# Patient Record
Sex: Female | Born: 1950
Health system: Southern US, Community
[De-identification: ages and names within clinical notes are randomized; demographics above are authoritative.]

## PROBLEM LIST (undated history)

## (undated) DIAGNOSIS — K219 Gastro-esophageal reflux disease without esophagitis: Secondary | ICD-10-CM

## (undated) DIAGNOSIS — N183 Chronic kidney disease, stage 3 unspecified: Secondary | ICD-10-CM

## (undated) DIAGNOSIS — Z992 Dependence on renal dialysis: Secondary | ICD-10-CM

## (undated) DIAGNOSIS — N186 End stage renal disease: Secondary | ICD-10-CM

## (undated) DIAGNOSIS — K635 Polyp of colon: Secondary | ICD-10-CM

## (undated) DIAGNOSIS — E042 Nontoxic multinodular goiter: Secondary | ICD-10-CM

## (undated) DIAGNOSIS — D649 Anemia, unspecified: Secondary | ICD-10-CM

## (undated) DIAGNOSIS — E871 Hypo-osmolality and hyponatremia: Secondary | ICD-10-CM

## (undated) DIAGNOSIS — F419 Anxiety disorder, unspecified: Secondary | ICD-10-CM

## (undated) DIAGNOSIS — I10 Essential (primary) hypertension: Secondary | ICD-10-CM

## (undated) DIAGNOSIS — Z8619 Personal history of other infectious and parasitic diseases: Secondary | ICD-10-CM

## (undated) DIAGNOSIS — K2970 Gastritis, unspecified, without bleeding: Secondary | ICD-10-CM

## (undated) DIAGNOSIS — D72829 Elevated white blood cell count, unspecified: Secondary | ICD-10-CM

## (undated) HISTORY — DX: Essential (primary) hypertension: I10

## (undated) HISTORY — PX: KNEE SURGERY: SHX244

## (undated) HISTORY — DX: Gastro-esophageal reflux disease without esophagitis: K21.9

## (undated) HISTORY — DX: Personal history of other infectious and parasitic diseases: Z86.19

## (undated) HISTORY — PX: TONSILLECTOMY: SUR1361

---

## 1968-05-03 HISTORY — PX: TONSILLECTOMY AND ADENOIDECTOMY: SHX28

## 1983-05-04 HISTORY — PX: DILATION AND CURETTAGE OF UTERUS: SHX78

## 1988-05-03 HISTORY — PX: BREAST LUMPECTOMY: SHX2

## 1998-05-03 DIAGNOSIS — K219 Gastro-esophageal reflux disease without esophagitis: Secondary | ICD-10-CM | POA: Insufficient documentation

## 2000-10-03 ENCOUNTER — Other Ambulatory Visit: Admission: RE | Admit: 2000-10-03 | Discharge: 2000-10-03 | Payer: Self-pay | Admitting: Family Medicine

## 2001-05-03 HISTORY — PX: ABDOMINAL HYSTERECTOMY: SHX81

## 2002-08-02 ENCOUNTER — Encounter: Payer: Self-pay | Admitting: Obstetrics and Gynecology

## 2002-08-02 ENCOUNTER — Ambulatory Visit (HOSPITAL_COMMUNITY): Admission: RE | Admit: 2002-08-02 | Discharge: 2002-08-02 | Payer: Self-pay | Admitting: Obstetrics and Gynecology

## 2006-03-02 ENCOUNTER — Ambulatory Visit: Payer: Self-pay | Admitting: Gastroenterology

## 2006-03-02 HISTORY — PX: UPPER GI ENDOSCOPY: SHX6162

## 2006-03-02 LAB — HM COLONOSCOPY

## 2006-10-24 DIAGNOSIS — I10 Essential (primary) hypertension: Secondary | ICD-10-CM | POA: Insufficient documentation

## 2007-07-31 ENCOUNTER — Ambulatory Visit: Payer: Self-pay | Admitting: Family Medicine

## 2008-05-03 HISTORY — PX: CHOLECYSTECTOMY: SHX55

## 2008-12-03 ENCOUNTER — Ambulatory Visit: Payer: Self-pay | Admitting: General Surgery

## 2008-12-26 ENCOUNTER — Ambulatory Visit: Payer: Self-pay | Admitting: General Surgery

## 2009-01-16 ENCOUNTER — Ambulatory Visit: Payer: Self-pay | Admitting: General Surgery

## 2009-07-18 ENCOUNTER — Ambulatory Visit: Payer: Self-pay | Admitting: Family Medicine

## 2009-07-23 ENCOUNTER — Ambulatory Visit: Payer: Self-pay | Admitting: Family Medicine

## 2009-07-23 HISTORY — PX: OTHER SURGICAL HISTORY: SHX169

## 2009-08-19 ENCOUNTER — Encounter: Admission: RE | Admit: 2009-08-19 | Discharge: 2009-08-19 | Payer: Self-pay | Admitting: Neurosurgery

## 2009-09-03 ENCOUNTER — Encounter: Admission: RE | Admit: 2009-09-03 | Discharge: 2009-09-03 | Payer: Self-pay | Admitting: Neurosurgery

## 2009-09-25 ENCOUNTER — Encounter: Admission: RE | Admit: 2009-09-25 | Discharge: 2009-09-25 | Payer: Self-pay | Admitting: Internal Medicine

## 2010-01-13 ENCOUNTER — Ambulatory Visit: Payer: Self-pay | Admitting: Family Medicine

## 2010-01-28 ENCOUNTER — Ambulatory Visit: Payer: Self-pay | Admitting: Pain Medicine

## 2010-02-04 ENCOUNTER — Ambulatory Visit: Payer: Self-pay | Admitting: Pain Medicine

## 2010-02-05 ENCOUNTER — Ambulatory Visit: Payer: Self-pay | Admitting: Pain Medicine

## 2010-05-03 HISTORY — PX: GALLBLADDER SURGERY: SHX652

## 2010-08-12 ENCOUNTER — Ambulatory Visit: Payer: Self-pay | Admitting: Specialist

## 2010-08-19 ENCOUNTER — Ambulatory Visit: Payer: Self-pay | Admitting: Specialist

## 2010-11-30 ENCOUNTER — Ambulatory Visit: Payer: Self-pay | Admitting: Family Medicine

## 2012-12-12 ENCOUNTER — Ambulatory Visit: Payer: Self-pay | Admitting: Pain Medicine

## 2013-01-04 ENCOUNTER — Ambulatory Visit: Payer: Self-pay | Admitting: Pain Medicine

## 2013-07-11 LAB — LIPID PANEL
Cholesterol: 188 mg/dL (ref 0–200)
HDL: 78 mg/dL — AB (ref 35–70)
LDL CALC: 85 mg/dL
TRIGLYCERIDES: 125 mg/dL (ref 40–160)

## 2013-07-19 LAB — HM MAMMOGRAPHY: HM MAMMO: NORMAL

## 2013-08-10 DIAGNOSIS — M810 Age-related osteoporosis without current pathological fracture: Secondary | ICD-10-CM | POA: Insufficient documentation

## 2013-08-14 ENCOUNTER — Ambulatory Visit: Payer: Self-pay | Admitting: Family Medicine

## 2014-03-12 DIAGNOSIS — M4310 Spondylolisthesis, site unspecified: Secondary | ICD-10-CM | POA: Insufficient documentation

## 2014-04-30 LAB — BASIC METABOLIC PANEL
BUN: 29 mg/dL — AB (ref 4–21)
CREATININE: 1.3 mg/dL — AB (ref 0.5–1.1)
GLUCOSE: 103 mg/dL
Potassium: 5.4 mmol/L — AB (ref 3.4–5.3)
Sodium: 134 mmol/L — AB (ref 137–147)

## 2014-04-30 LAB — HEPATIC FUNCTION PANEL
ALT: 12 U/L (ref 7–35)
AST: 21 U/L (ref 13–35)

## 2014-04-30 LAB — TSH: TSH: 1.25 u[IU]/mL (ref 0.41–5.90)

## 2014-04-30 LAB — CBC AND DIFFERENTIAL
HEMATOCRIT: 34 % — AB (ref 36–46)
Hemoglobin: 12.3 g/dL (ref 12.0–16.0)
PLATELETS: 283 10*3/uL (ref 150–399)
WBC: 5.1 10^3/mL

## 2014-05-21 DIAGNOSIS — M47816 Spondylosis without myelopathy or radiculopathy, lumbar region: Secondary | ICD-10-CM | POA: Insufficient documentation

## 2014-06-20 DIAGNOSIS — M9953 Intervertebral disc stenosis of neural canal of lumbar region: Secondary | ICD-10-CM | POA: Insufficient documentation

## 2014-06-26 DIAGNOSIS — M419 Scoliosis, unspecified: Secondary | ICD-10-CM | POA: Insufficient documentation

## 2014-09-06 ENCOUNTER — Ambulatory Visit
Admission: RE | Admit: 2014-09-06 | Discharge: 2014-09-06 | Disposition: A | Discharge status: Home / Self Care | Payer: BLUE CROSS/BLUE SHIELD | Source: Ambulatory Visit | Attending: Family Medicine | Admitting: Family Medicine

## 2014-09-06 ENCOUNTER — Other Ambulatory Visit: Payer: Self-pay | Admitting: Family Medicine

## 2014-09-06 DIAGNOSIS — M25472 Effusion, left ankle: Secondary | ICD-10-CM | POA: Insufficient documentation

## 2014-09-06 DIAGNOSIS — M25572 Pain in left ankle and joints of left foot: Secondary | ICD-10-CM | POA: Insufficient documentation

## 2014-09-06 DIAGNOSIS — M25571 Pain in right ankle and joints of right foot: Secondary | ICD-10-CM

## 2014-10-16 ENCOUNTER — Other Ambulatory Visit: Payer: Self-pay | Admitting: Family Medicine

## 2014-11-05 ENCOUNTER — Other Ambulatory Visit: Payer: Self-pay | Admitting: Family Medicine

## 2014-11-05 DIAGNOSIS — G4700 Insomnia, unspecified: Secondary | ICD-10-CM | POA: Insufficient documentation

## 2014-11-05 NOTE — Telephone Encounter (Signed)
Ok to call in rx.  Thanks.  

## 2014-11-12 ENCOUNTER — Other Ambulatory Visit: Payer: Self-pay | Admitting: Family Medicine

## 2014-11-14 ENCOUNTER — Other Ambulatory Visit: Payer: Self-pay | Admitting: Family Medicine

## 2014-11-20 ENCOUNTER — Other Ambulatory Visit: Payer: Self-pay | Admitting: Family Medicine

## 2014-11-20 MED ORDER — BUTALBITAL-APAP-CAFFEINE 50-325-40 MG PO TABS: ORAL_TABLET | ORAL | Status: DC

## 2014-11-20 NOTE — Telephone Encounter (Signed)
Pt contacted office for refill request on the following medications:  Butalbital-APAP-Caffeine 50-325-40mg .  1-2 tablets every 6 hours as needed. [place].  CB#[phone]/MW

## 2014-11-20 NOTE — Telephone Encounter (Signed)
Refill request for But-Ace-Caff 50-325-40 mg Last filled by MD on- 09/19/2014 #150 x3 Last Appt: 09/06/2014 Next Appt: 12/04/2014 Please advise refill?

## 2014-11-21 NOTE — Telephone Encounter (Signed)
Rx phoned into pharmacy.

## 2014-11-28 ENCOUNTER — Other Ambulatory Visit: Payer: Self-pay | Admitting: Family Medicine

## 2014-12-02 ENCOUNTER — Other Ambulatory Visit: Payer: Self-pay | Admitting: Family Medicine

## 2014-12-03 DIAGNOSIS — B001 Herpesviral vesicular dermatitis: Secondary | ICD-10-CM | POA: Insufficient documentation

## 2014-12-03 DIAGNOSIS — E876 Hypokalemia: Secondary | ICD-10-CM | POA: Insufficient documentation

## 2014-12-03 DIAGNOSIS — J302 Other seasonal allergic rhinitis: Secondary | ICD-10-CM | POA: Insufficient documentation

## 2014-12-03 DIAGNOSIS — M5126 Other intervertebral disc displacement, lumbar region: Secondary | ICD-10-CM | POA: Insufficient documentation

## 2014-12-03 DIAGNOSIS — M5412 Radiculopathy, cervical region: Secondary | ICD-10-CM | POA: Insufficient documentation

## 2014-12-03 DIAGNOSIS — J329 Chronic sinusitis, unspecified: Secondary | ICD-10-CM | POA: Insufficient documentation

## 2014-12-04 ENCOUNTER — Ambulatory Visit (INDEPENDENT_AMBULATORY_CARE_PROVIDER_SITE_OTHER): Payer: BLUE CROSS/BLUE SHIELD | Admitting: Family Medicine

## 2014-12-04 ENCOUNTER — Encounter: Payer: Self-pay | Admitting: Family Medicine

## 2014-12-04 VITALS — BP 152/76 | HR 62 | Temp 98.4°F | Resp 16 | Ht 62.5 in | Wt 116.0 lb

## 2014-12-04 DIAGNOSIS — I10 Essential (primary) hypertension: Secondary | ICD-10-CM

## 2014-12-04 DIAGNOSIS — Z0000 Encounter for general adult medical examination without abnormal findings: Secondary | ICD-10-CM | POA: Diagnosis not present

## 2014-12-04 DIAGNOSIS — M8580 Other specified disorders of bone density and structure, unspecified site: Secondary | ICD-10-CM

## 2014-12-04 DIAGNOSIS — Z1239 Encounter for other screening for malignant neoplasm of breast: Secondary | ICD-10-CM

## 2014-12-04 DIAGNOSIS — Z13820 Encounter for screening for osteoporosis: Secondary | ICD-10-CM

## 2014-12-04 DIAGNOSIS — Z23 Encounter for immunization: Secondary | ICD-10-CM

## 2014-12-04 MED ORDER — FLUTICASONE PROPIONATE 50 MCG/ACT NA SUSP: 1.0000 | Freq: Every day | NASAL | Status: DC

## 2014-12-04 MED ORDER — BENZONATATE 100 MG PO CAPS: 100.0000 mg | ORAL_CAPSULE | Freq: Three times a day (TID) | ORAL | Status: AC | PRN

## 2014-12-04 MED ORDER — OMEPRAZOLE 40 MG PO CPDR: 40.0000 mg | DELAYED_RELEASE_CAPSULE | Freq: Every day | ORAL | Status: DC

## 2014-12-04 NOTE — Progress Notes (Signed)
Patient: Amy [name], Female    DOB: [birth year], 64 y.o.   MRN: [id number] Visit Date: 12/04/2014  Today's Provider: Lelon Huh, MD   Chief Complaint  Patient presents with  . Annual Exam  . Hypertension  . Anxiety   Subjective:    Annual physical exam Amy [name] is a 64 y.o. female who presents today for health maintenance and complete physical. She feels poorly. Is having a lot of sinus congestion and pressure and just started back on Augmentin.  She reports exercising never. She reports she is sleeping fairly well.  -----------------------------------------------------------------  Hypertension, follow-up:  BP Readings from Last 3 Encounters:  12/04/14 152/76  09/26/14 140/70    She was last seen for hypertension 1 years ago.  BP at that visit was  136/76. Management changes since that visit include  none. She reports good compliance with treatment. She is not having side effects.  She is not exercising. She is adherent to low salt diet.   Outside blood pressures are  Not being checked. She is experiencing swelling. Patient denies chest pain, chest pressure/discomfort, claudication, dyspnea, exertional chest pressure/discomfort, fatigue, irregular heart beat, near-syncope, orthopnea, palpitations, paroxysmal nocturnal dyspnea, syncope and tachypnea.   Cardiovascular risk factors include advanced age (older than 62 for men, 60 for women) and hypertension.  Use of agents associated with hypertension: none.     Weight trend: stable Wt Readings from Last 3 Encounters:  12/04/14 116 lb (52.617 kg)  09/26/14 113 lb (51.256 kg)    Current diet: in general, a "healthy" diet    ------------------------------------------------------------------------  Anxiety Follow up: Last office visit was 1 year ago and no changes were made. Patient reports good compliance with treatment, good tolerance and good symptom control.   Back pain Follow up: Last office  visit was 1 year ago and no changes were made.  Patient is not currently taking any pain medications  To help with her back pain. Patient reports her back pain has worsened.   Review of Systems  HENT: Positive for congestion, sinus pressure and sore throat.   Cardiovascular: Positive for leg swelling (in ankles).  All other systems reviewed and are negative.   Social History She  reports that she has never smoked. She does not have any smokeless tobacco history on file. She reports that she drinks alcohol. She reports that she does not use illicit drugs.  Patient Active Problem List   Diagnosis Date Noted  . Allergic rhinitis, seasonal 12/03/2014  . Cervical radiculopathy 12/03/2014  . Estrogen deficiency 12/03/2014  . Fever blister 12/03/2014  . Herniated lumbar intervertebral disc 12/03/2014  . Hypokalemia 12/03/2014  . Recurrent sinus infections 12/03/2014  . Insomnia 11/05/2014  . OP (osteoporosis) 08/10/2013  . Idiopathic peripheral neuropathy 03/24/2009  . Edema 12/27/2007  . Headache, tension-type 10/25/2006  . Essential (primary) hypertension 10/24/2006  . Anxiety 05/03/1998  . Esophageal reflux 05/03/1998    Past Surgical History  Procedure Laterality Date  . Cholecystectomy  2010  . Gallbladder surgery  2012  . Abdominal hysterectomy  2003    due to heavy periods and clotting during menses  . Breast lumpectomy Left 1990  . Dilation and curettage of uterus  1985  . Tonsillectomy and adenoidectomy  1970  . Knee surgery      Dr. Tamala Julian  . Upper gi endoscopy  03/02/2006    H. Pylori negative; Dr. Allen Norris  . Mri, lumbar spine  07/23/2009    Multilevel  annular bulge and disc protrusion at L2-L3, L3-L4, L4-L5, and L5-S1    Family History  Family Status  Relation Status Death Age  . Mother Deceased   . Father Deceased 37    pancreatic cancer  . Maternal Grandmother Deceased     CHF  . Paternal Grandfather Deceased     Her family history includes Cancer in her  paternal grandfather; Congestive Heart Failure in her maternal grandmother; Heart disease in her maternal uncle; Pancreatic cancer in her father.    Allergies  Allergen Reactions  . Cephalosporins   . Clarithromycin   . Lunesta  [Eszopiclone]     Heart racing  . Penicillin V     Not allerrgic to augmentin  . Sulfa Antibiotics     Previous Medications   AMOXICILLIN-CLAVULANATE (AUGMENTIN XR) 1000-62.5 MG PER TABLET    TAKE 2 TABLETS BY MOUTH TWICE A DAY   ATENOLOL (TENORMIN) 50 MG TABLET    TAKE 1 TABLET BY MOUTH EVERY DAY   BUPROPION (WELLBUTRIN) 75 MG TABLET    Take 2 tablets by mouth daily.   BUTALBITAL-ACETAMINOPHEN-CAFFEINE (FIORICET, ESGIC) 50-325-40 MG PER TABLET    every 6 hours as needed, not to exceed 8 tablets in a day   FLUTICASONE (FLONASE) 50 MCG/ACT NASAL SPRAY    Place 1-2 sprays into both nostrils daily.   FUROSEMIDE (LASIX) 20 MG TABLET    Take 1 tablet by mouth daily as needed.   LISINOPRIL-HYDROCHLOROTHIAZIDE (PRINZIDE,ZESTORETIC) 20-12.5 MG PER TABLET    Take 1 tablet by mouth daily.   LORAZEPAM (ATIVAN) 1 MG TABLET    Take 1 tablet by mouth 2 (two) times daily as needed.   MUPIROCIN OINTMENT (BACTROBAN) 2 %    Apply 1 application topically 3 (three) times daily.   OMEPRAZOLE (PRILOSEC) 40 MG CAPSULE    Take 40 mg by mouth daily.   ZOLPIDEM (AMBIEN) 10 MG TABLET    TAKE 1 TABLET BY MOUTH EVERY NIGHT AT BEDTIME    Patient Care Team: [name], MD as PCP - General (Family Medicine)     Objective:   Vitals: BP 152/76 mmHg  Pulse 62  Temp(Src) 98.4 F (36.9 C) (Oral)  Resp 16  Ht 5' 2.5" (1.588 m)  Wt 116 lb (52.617 kg)  BMI 20.87 kg/m2  SpO2 98%   Physical Exam   General Appearance:    Alert, cooperative, no distress, appears stated age  Head:    Normocephalic, without obvious abnormality, atraumatic  Eyes:    PERRL, conjunctiva/corneas clear, EOM's intact, fundi    benign, both eyes  Ears:    Normal TM's and external ear canals, both ears    Nose:   Congested with clear drainage. Tender frontal sinuses.   Throat:   Lips, mucosa, and tongue normal; teeth and gums normal  Neck:   Supple, symmetrical, trachea midline, no adenopathy;    thyroid:  no enlargement/tenderness/nodules; no carotid   bruit or JVD  Back:     Symmetric, kyphotic, ROM normal, no CVA tenderness  Lungs:     Clear to auscultation bilaterally, respirations unlabored  Chest Wall:    No tenderness or deformity   Heart:    Regular rate and rhythm, S1 and S2 normal, no murmur, rub   or gallop  Breast Exam:    normal appearance, no masses or tenderness  Abdomen:     Soft, non-tender, bowel sounds active all four quadrants,    no masses, no organomegaly  Pevic:  not indicated  Extremities:   Extremities normal, atraumatic, no cyanosis or edema  Pulses:   2+ and symmetric all extremities  Skin:   Skin color, texture, turgor normal, no rashes or lesions  Lymph nodes:   Cervical, supraclavicular, and axillary nodes normal  Neurologic:   CNII-XII intact, normal strength, sensation and reflexes    throughout    Depression Screen PHQ 2/9 Scores 12/04/2014  PHQ - 2 Score 0  PHQ- 9 Score 0      Assessment & Plan:     Routine Health Maintenance and Physical Exam  Exercise Activities and Dietary recommendations Goals    None      Immunization History  Administered Date(s) Administered  . Td 08/15/2000  . Tdap 02/02/2012  . Zoster 01/20/2011    Health Maintenance  Topic Date Due  . Samul Dada  11/24/1967  . MAMMOGRAM  11/24/1998  . COLONOSCOPY  11/24/1998  . ZOSTAVAX  11/23/2008  . DEXA SCAN  11/23/2013  . PNA vac Low Risk Adult (1 of 2 - PCV13) 11/23/2013  . INFLUENZA VACCINE  12/02/2014      Discussed health benefits of physical activity, and encouraged her to engage in regular exercise appropriate for her age and condition.    --------------------------------------------------------------------  1. Screening for breast cancer  - MM  Digital Screening; Future  2. Need for pneumococcal vaccine  - Pneumococcal conjugate vaccine 13-valent  3. Essential hypertension Fairly well controlled. Continue current medications.   - EKG 12-Lead - Lipid panel - Renal function panel  4. Screening for osteoporosis   5. Well woman exam without gynecological exam   6. Osteopenia  - Vit D  25 hydroxy (rtn osteoporosis monitoring) - DG Bone Density; Future

## 2014-12-05 ENCOUNTER — Telehealth: Payer: Self-pay

## 2014-12-05 ENCOUNTER — Other Ambulatory Visit: Payer: Self-pay | Admitting: Family Medicine

## 2014-12-05 ENCOUNTER — Encounter: Payer: Self-pay | Admitting: Family Medicine

## 2014-12-05 LAB — RENAL FUNCTION PANEL
ALBUMIN: 4.6 g/dL (ref 3.6–4.8)
BUN/Creatinine Ratio: 14 (ref 11–26)
BUN: 20 mg/dL (ref 8–27)
CHLORIDE: 98 mmol/L (ref 97–108)
CO2: 21 mmol/L (ref 18–29)
Calcium: 9.7 mg/dL (ref 8.7–10.3)
Creatinine, Ser: 1.39 mg/dL — ABNORMAL HIGH (ref 0.57–1.00)
GFR calc Af Amer: 46 mL/min/{1.73_m2} — ABNORMAL LOW (ref >59)
GFR, EST NON AFRICAN AMERICAN: 40 mL/min/{1.73_m2} — AB (ref >59)
GLUCOSE: 98 mg/dL (ref 65–99)
PHOSPHORUS: 3.1 mg/dL (ref 2.5–4.5)
POTASSIUM: 4.9 mmol/L (ref 3.5–5.2)
SODIUM: 137 mmol/L (ref 134–144)

## 2014-12-05 LAB — LIPID PANEL
CHOL/HDL RATIO: 2.2 ratio (ref 0.0–4.4)
CHOLESTEROL TOTAL: 203 mg/dL — AB (ref 100–199)
HDL: 93 mg/dL (ref >39)
LDL Calculated: 81 mg/dL (ref 0–99)
TRIGLYCERIDES: 143 mg/dL (ref 0–149)
VLDL Cholesterol Cal: 29 mg/dL (ref 5–40)

## 2014-12-05 LAB — VITAMIN D 25 HYDROXY (VIT D DEFICIENCY, FRACTURES): Vit D, 25-Hydroxy: 35.9 ng/mL (ref 30.0–100.0)

## 2014-12-05 MED ORDER — RANITIDINE HCL 150 MG PO TABS: 150.0000 mg | ORAL_TABLET | Freq: Two times a day (BID) | ORAL | Status: DC

## 2014-12-05 NOTE — Telephone Encounter (Signed)
Advised pt of the above results, pt agreed to take ranitidine 150 mg twice a day, she stated that she will be waiting for the prescription to be send to pharmacy. She stated that she will call to make an appointment to recheck the renal panel in 6 - 8 weeks.  Thanks,

## 2014-12-05 NOTE — Telephone Encounter (Signed)
-----   Message from [name], MD sent at 12/05/2014  7:53 AM EDT ----- Creatinine has increased slightly. Some recent studies have shown that reflux medications like omeprazole can increase risk for kidney disease. Recommend she stop taking omeprazole and change to ranitidine 150mg  twice a day as needed for reflux, #60, rf x 2 and recheck renal panel in 6-8 weeks.  Cholesterol and vitamin d levels are both very good.

## 2014-12-17 ENCOUNTER — Ambulatory Visit: Payer: BLUE CROSS/BLUE SHIELD

## 2014-12-24 ENCOUNTER — Other Ambulatory Visit: Payer: Self-pay | Admitting: Family Medicine

## 2014-12-24 NOTE — Telephone Encounter (Signed)
Please call in fioricet

## 2014-12-24 NOTE — Telephone Encounter (Signed)
Pt contacted office for refill request on the following medications: butalbital-acetaminophen-caffeine (FIORICET, ESGIC) 50-325-40 MG per tablet to [facility]. Thanks TNP

## 2014-12-25 NOTE — Telephone Encounter (Signed)
Rx called in to pharmacy. 

## 2014-12-27 ENCOUNTER — Other Ambulatory Visit: Payer: Self-pay | Admitting: Family Medicine

## 2015-01-01 ENCOUNTER — Other Ambulatory Visit: Payer: Self-pay | Admitting: Family Medicine

## 2015-01-01 NOTE — Telephone Encounter (Signed)
Refill request for lisinopril-hctz 20-12.5 mg qd Last filled by MD on- 01/28/2014 #30 x10 refills Last Appt: 12/04/2014 Next Appt: none Please advise refill?

## 2015-01-16 ENCOUNTER — Other Ambulatory Visit: Payer: Self-pay | Admitting: Family Medicine

## 2015-01-21 ENCOUNTER — Ambulatory Visit (INDEPENDENT_AMBULATORY_CARE_PROVIDER_SITE_OTHER): Payer: BLUE CROSS/BLUE SHIELD | Admitting: Family Medicine

## 2015-01-21 ENCOUNTER — Encounter: Payer: Self-pay | Admitting: Family Medicine

## 2015-01-21 VITALS — BP 148/80 | HR 63 | Temp 98.7°F | Resp 16 | Wt 116.0 lb

## 2015-01-21 DIAGNOSIS — K219 Gastro-esophageal reflux disease without esophagitis: Secondary | ICD-10-CM | POA: Diagnosis not present

## 2015-01-21 DIAGNOSIS — N189 Chronic kidney disease, unspecified: Secondary | ICD-10-CM

## 2015-01-21 DIAGNOSIS — Z23 Encounter for immunization: Secondary | ICD-10-CM | POA: Diagnosis not present

## 2015-01-21 DIAGNOSIS — I10 Essential (primary) hypertension: Secondary | ICD-10-CM

## 2015-01-21 MED ORDER — BUTALBITAL-APAP-CAFFEINE 50-325-40 MG PO TABS: ORAL_TABLET | ORAL | Status: DC

## 2015-01-21 NOTE — Progress Notes (Signed)
Patient: Amy [name] Female    DOB: [birth year]   64 y.o.   MRN: [id number] Visit Date: 01/21/2015  Today's Provider: Lelon Huh, MD   Chief Complaint  Patient presents with  . Follow-up  . Gastrophageal Reflux   Subjective:    HPI  She is her to follow up on elevated creatinine of 1.39 on labs from CPE 12/04/2014. She is noted to have a very slow increase in eGFR over the last several years. She is on ACEI-hctz combo for hypertension, but has had no changes in her medications for long period of time. We decided to take her off of omeprazole due to studies finding increased risk of CKD in patients on long term PPI. She has started ranitidine and states she has had no side effects, and has no heartburn or other reflux symptoms since changing medication.   BMP Latest Ref Rng 12/04/2014 04/30/2014  Glucose 65 - 99 mg/dL 98 -  BUN 8 - 27 mg/dL 20 29(A)  Creatinine 0.57 - 1.00 mg/dL 1.39(H) 1.3(A)  BUN/Creat Ratio 11 - 26 14 -  Sodium 134 - 144 mmol/L 137 134(A)  Potassium 3.5 - 5.2 mmol/L 4.9 5.4(A)  Chloride 97 - 108 mmol/L 98 -  CO2 18 - 29 mmol/L 21 -  Calcium 8.7 - 10.3 mg/dL 9.7 -      Allergies  Allergen Reactions  . Cephalosporins   . Clarithromycin   . Lunesta  [Eszopiclone]     Heart racing  . Penicillin V     Not allerrgic to augmentin  . Sulfa Antibiotics    Previous Medications   AMOXICILLIN-CLAVULANATE (AUGMENTIN XR) 1000-62.5 MG PER TABLET    TAKE 2 TABLETS BY MOUTH TWICE A DAY   ATENOLOL (TENORMIN) 50 MG TABLET    TAKE 1 TABLET BY MOUTH EVERY DAY   BUPROPION (WELLBUTRIN) 75 MG TABLET    Take 2 tablets by mouth daily.   BUTALBITAL-ACETAMINOPHEN-CAFFEINE (FIORICET, ESGIC) 50-325-40 MG PER TABLET    TAKE 1 TABLET BY MOUTH EVERY 6 HOURS AS NEEDED. MAX 8 TABLETS DAILY   FLUTICASONE (FLONASE) 50 MCG/ACT NASAL SPRAY    Place 1-2 sprays into both nostrils daily.   FUROSEMIDE (LASIX) 20 MG TABLET    Take 1 tablet by mouth daily as needed.   LISINOPRIL-HYDROCHLOROTHIAZIDE (PRINZIDE,ZESTORETIC) 20-12.5 MG PER TABLET    TAKE 1 TABLET BY MOUTH EVERY DAY   LORAZEPAM (ATIVAN) 1 MG TABLET    Take 1 tablet by mouth 2 (two) times daily as needed.   MUPIROCIN OINTMENT (BACTROBAN) 2 %    Apply 1 application topically 3 (three) times daily.   OMEPRAZOLE (PRILOSEC) 40 MG CAPSULE    Take 1 capsule (40 mg total) by mouth daily.   RANITIDINE (ZANTAC) 150 MG TABLET    Take 1 tablet (150 mg total) by mouth 2 (two) times daily.   ZOLPIDEM (AMBIEN) 10 MG TABLET    TAKE 1 TABLET BY MOUTH EVERY NIGHT AT BEDTIME    Review of Systems  Constitutional: Negative for fever, chills, appetite change and fatigue.  Respiratory: Negative for chest tightness and shortness of breath.   Cardiovascular: Positive for leg swelling. Negative for chest pain and palpitations.  Gastrointestinal: Negative for nausea, vomiting and abdominal pain.  Neurological: Negative for dizziness, weakness and light-headedness.    Social History  Substance Use Topics  . Smoking status: Never Smoker   . Smokeless tobacco: Not on file  . Alcohol Use: 0.0 oz/week  0 Standard drinks or equivalent per week     Comment: occasional   Objective:   BP 160/90 mmHg  Pulse 63  Temp(Src) 98.7 F (37.1 C) (Oral)  Resp 16  Wt 116 lb (52.617 kg)  SpO2 100%  Physical Exam  General Appearance:    Alert, cooperative, no distress  Eyes:    PERRL, conjunctiva/corneas clear, EOM's intact       Lungs:     Clear to auscultation bilaterally, respirations unlabored  Heart:    Regular rate and rhythm. Trace bilateral ankle edema.   Neurologic:   Awake, alert, oriented x 3. No apparent focal neurological           defect.          Assessment & Plan:     1. Chronic kidney disease, unspecified stage Likely multifactorial as she has chronic hypertension. Possible contribution of PPI, which she is now off of as below. Recheck renal functions to re-establish baseline. Counseled to avoid NSAIDs  and drink plenty of water.  - Renal function panel  2. Gastroesophageal reflux disease, esophagitis presence not specified Off of PPI due to potential renal effects, well controlled on ranitidine.   3. Essential (primary) hypertension BP up today, but usually SBP under 140. Counseled to avoid salty foods.   4. Need for influenza vaccination  - Flu vaccine HIGH DOSE PF       Lelon Huh, MD  [facility]

## 2015-01-24 ENCOUNTER — Other Ambulatory Visit: Payer: Self-pay | Admitting: Family Medicine

## 2015-01-27 ENCOUNTER — Other Ambulatory Visit: Payer: Self-pay | Admitting: Family Medicine

## 2015-01-28 ENCOUNTER — Other Ambulatory Visit: Payer: Self-pay | Admitting: Family Medicine

## 2015-01-28 ENCOUNTER — Other Ambulatory Visit: Payer: Self-pay

## 2015-01-28 MED ORDER — LISINOPRIL-HYDROCHLOROTHIAZIDE 20-12.5 MG PO TABS: 1.0000 | ORAL_TABLET | Freq: Every day | ORAL | Status: DC

## 2015-02-10 ENCOUNTER — Other Ambulatory Visit: Payer: Self-pay | Admitting: Family Medicine

## 2015-02-25 ENCOUNTER — Other Ambulatory Visit: Payer: Self-pay | Admitting: Family Medicine

## 2015-03-17 ENCOUNTER — Other Ambulatory Visit: Payer: Self-pay | Admitting: Family Medicine

## 2015-03-26 ENCOUNTER — Other Ambulatory Visit: Payer: Self-pay | Admitting: Family Medicine

## 2015-03-26 NOTE — Telephone Encounter (Signed)
Rx called in to pharmacy. 

## 2015-03-26 NOTE — Telephone Encounter (Signed)
Please call in lorazepam.  

## 2015-03-30 ENCOUNTER — Other Ambulatory Visit: Payer: Self-pay | Admitting: Family Medicine

## 2015-03-31 NOTE — Telephone Encounter (Signed)
Please call in alprazolam.  

## 2015-04-01 ENCOUNTER — Other Ambulatory Visit: Payer: Self-pay | Admitting: Family Medicine

## 2015-04-01 NOTE — Telephone Encounter (Signed)
Rx called in to pharmacy. 

## 2015-04-18 ENCOUNTER — Other Ambulatory Visit: Payer: Self-pay | Admitting: Family Medicine

## 2015-04-18 NOTE — Telephone Encounter (Signed)
Pt contacted office for refill request on the following medications: butalbital-acetaminophen-caffeine (FIORICET, ESGIC) 50-325-40 MG per tablet to [facility]. Thanks TNP

## 2015-04-20 ENCOUNTER — Other Ambulatory Visit: Payer: Self-pay | Admitting: Family Medicine

## 2015-04-21 MED ORDER — BUTALBITAL-APAP-CAFFEINE 50-325-40 MG PO TABS: ORAL_TABLET | ORAL | Status: DC

## 2015-04-21 NOTE — Telephone Encounter (Signed)
Rx called in to pharmacy. 

## 2015-04-21 NOTE — Telephone Encounter (Signed)
Please Call in [facility]

## 2015-04-22 ENCOUNTER — Other Ambulatory Visit: Payer: Self-pay | Admitting: Family Medicine

## 2015-04-29 ENCOUNTER — Other Ambulatory Visit: Payer: Self-pay | Admitting: Family Medicine

## 2015-04-29 DIAGNOSIS — G4700 Insomnia, unspecified: Secondary | ICD-10-CM

## 2015-05-01 NOTE — Telephone Encounter (Signed)
Please call in zolpidem  

## 2015-05-01 NOTE — Telephone Encounter (Signed)
Rx called in to pharmacy. 

## 2015-05-06 ENCOUNTER — Other Ambulatory Visit: Payer: Self-pay | Admitting: Family Medicine

## 2015-05-25 ENCOUNTER — Other Ambulatory Visit: Payer: Self-pay | Admitting: Family Medicine

## 2015-06-16 ENCOUNTER — Other Ambulatory Visit: Payer: Self-pay | Admitting: Family Medicine

## 2015-06-24 ENCOUNTER — Telehealth: Payer: Self-pay | Admitting: Family Medicine

## 2015-06-24 MED ORDER — VALACYCLOVIR HCL 1 G PO TABS: ORAL_TABLET | ORAL | Status: DC

## 2015-06-24 NOTE — Telephone Encounter (Signed)
Pt is requesting a Rx for fever blisters that she has rec'[d before.  Pt states has rec'd a ointment and a pill to treat this in the past. Pt is not sure of the name of either Rx.  [place].  CB#[phone]/MW

## 2015-06-24 NOTE — Telephone Encounter (Signed)
Please review. Thanks!  

## 2015-06-27 ENCOUNTER — Encounter: Payer: Self-pay | Admitting: Family Medicine

## 2015-06-27 ENCOUNTER — Telehealth: Payer: Self-pay | Admitting: Family Medicine

## 2015-06-27 DIAGNOSIS — E041 Nontoxic single thyroid nodule: Secondary | ICD-10-CM | POA: Insufficient documentation

## 2015-06-27 NOTE — Telephone Encounter (Signed)
Please advise patient we received copy of recent MRI from [facility] showing small thyroid nodule. She needs to have thyroid ultrasound done for further evaluation, and schedule follow up o.v. 1-2 days later to go over results.

## 2015-06-29 ENCOUNTER — Other Ambulatory Visit: Payer: Self-pay | Admitting: Family Medicine

## 2015-06-30 NOTE — Telephone Encounter (Signed)
Please schedule US thyroid. Thanks!

## 2015-06-30 NOTE — Telephone Encounter (Signed)
Patient notified. Patient expressed understanding.  

## 2015-07-01 ENCOUNTER — Ambulatory Visit: Payer: BLUE CROSS/BLUE SHIELD | Admitting: Family Medicine

## 2015-07-02 ENCOUNTER — Ambulatory Visit: Payer: BLUE CROSS/BLUE SHIELD

## 2015-07-04 ENCOUNTER — Telehealth: Payer: Self-pay | Admitting: Family Medicine

## 2015-07-04 DIAGNOSIS — E042 Nontoxic multinodular goiter: Secondary | ICD-10-CM | POA: Insufficient documentation

## 2015-07-04 NOTE — Telephone Encounter (Signed)
Ultrasound shows several small thyroid cysts. These are usually benign, but need further evaluation to make sure. Need referral to ENT

## 2015-07-04 NOTE — Telephone Encounter (Signed)
Advised patient of results. Please refer patient to ENT for further evaluation.

## 2015-07-04 NOTE — Telephone Encounter (Signed)
Pt is calling back about test results from her thyroid US.  ThanksTeri

## 2015-07-04 NOTE — Telephone Encounter (Signed)
Have you received results?-aa

## 2015-07-14 ENCOUNTER — Other Ambulatory Visit: Payer: Self-pay | Admitting: Family Medicine

## 2015-07-18 ENCOUNTER — Encounter: Payer: Self-pay | Admitting: Family Medicine

## 2015-07-21 ENCOUNTER — Other Ambulatory Visit: Payer: Self-pay | Admitting: Family Medicine

## 2015-07-22 ENCOUNTER — Encounter: Payer: Self-pay | Admitting: Family Medicine

## 2015-07-22 ENCOUNTER — Telehealth: Payer: Self-pay | Admitting: Family Medicine

## 2015-07-22 ENCOUNTER — Ambulatory Visit (INDEPENDENT_AMBULATORY_CARE_PROVIDER_SITE_OTHER): Payer: BLUE CROSS/BLUE SHIELD | Admitting: Family Medicine

## 2015-07-22 VITALS — BP 140/80 | HR 66 | Temp 99.1°F | Resp 16 | Ht 62.5 in | Wt 109.0 lb

## 2015-07-22 DIAGNOSIS — I10 Essential (primary) hypertension: Secondary | ICD-10-CM

## 2015-07-22 DIAGNOSIS — E041 Nontoxic single thyroid nodule: Secondary | ICD-10-CM | POA: Diagnosis not present

## 2015-07-22 DIAGNOSIS — K219 Gastro-esophageal reflux disease without esophagitis: Secondary | ICD-10-CM

## 2015-07-22 DIAGNOSIS — J0111 Acute recurrent frontal sinusitis: Secondary | ICD-10-CM | POA: Diagnosis not present

## 2015-07-22 MED ORDER — FIRST-DUKES MOUTHWASH MT SUSP
10.0000 mL | OROMUCOSAL | Status: DC | PRN
Start: 2015-07-22 — End: 2015-12-11

## 2015-07-22 MED ORDER — AMOXICILLIN-POT CLAVULANATE 875-125 MG PO TABS: 1.0000 | ORAL_TABLET | Freq: Two times a day (BID) | ORAL | Status: DC

## 2015-07-22 NOTE — Progress Notes (Signed)
Patient: Amy [name] Female    DOB: 1950-10-02   65 y.o.   MRN: [id number] Visit Date: 07/22/2015  Today's Provider: Lelon Huh, MD   Chief Complaint  Patient presents with  . Sore Throat   Subjective:    Sore Throat  This is a new problem. Episode onset: 3 days ago. The problem has been unchanged. Neither side of throat is experiencing more pain than the other. There has been no fever. The pain is moderate. Associated symptoms include congestion, coughing, headaches, a hoarse voice, neck pain and trouble swallowing. Pertinent negatives include no abdominal pain, diarrhea, drooling, ear discharge, ear pain, plugged ear sensation, shortness of breath, stridor, swollen glands or vomiting. She has had exposure to strep. She has had no exposure to mono. Treatments tried: advil. The treatment provided mild relief.   Sore throat since Saturday 07/19/2015. Nasal congestion, sore throat, bilateral ear congestion, cough (productive) and some wheezing.   She is also to follow up on low eGFR from labs 01/2015. Has since changed from omeprazole to ranitidine due to increased risk of CKD with PPIs. She states doesn't remember to take ranitidine every day, but it works fairly well when she does take it.   She also had incidental finding of thyroid nodule on imaging studies of her c-spine, and has since had evaluation by Dr. Tami Ribas who recommend u/s after 6 months. She would like to have thyroid functions checked because she has been a little more fatigued.    Allergies  Allergen Reactions  . Cephalosporins   . Clarithromycin   . Lunesta  [Eszopiclone]     Heart racing  . Penicillin V     Not allerrgic to augmentin  . Sulfa Antibiotics    Previous Medications   ATENOLOL (TENORMIN) 50 MG TABLET    TAKE 1 TABLET BY MOUTH EVERY DAY   BUPROPION (WELLBUTRIN) 75 MG TABLET    Take 2 tablets by mouth daily.   BUTALBITAL-ACETAMINOPHEN-CAFFEINE (FIORICET, ESGIC) 50-325-40 MG TABLET    TAKE 1  TABLET BY MOUTH EVERY 6 HOURS AS NEEDED. MAX 8 TABLETS DAILY   BUTALBITAL-ACETAMINOPHEN-CAFFEINE (FIORICET, ESGIC) 50-325-40 MG TABLET    TAKE 1 TABLET BY MOUTH EVERY 6 HOURS AS NEEDED(MAX 8 TABLETS DAILY)   FLUTICASONE (FLONASE) 50 MCG/ACT NASAL SPRAY    Place 1-2 sprays into both nostrils daily.   FUROSEMIDE (LASIX) 20 MG TABLET    TAKE 1 TABLET BY MOUTH EVERY DAY AS NEEDED FOR SWELLING   LISINOPRIL-HYDROCHLOROTHIAZIDE (PRINZIDE,ZESTORETIC) 20-12.5 MG TABLET    Take 1 tablet by mouth daily.   LORAZEPAM (ATIVAN) 1 MG TABLET    TAKE 1 TABLET BY MOUTH TWO TIMES DAILY AS NEEDED   MUPIROCIN OINTMENT (BACTROBAN) 2 %    Apply 1 application topically 3 (three) times daily.   RANITIDINE (ZANTAC) 150 MG TABLET    Take 1 tablet (150 mg total) by mouth 2 (two) times daily.   VALACYCLOVIR (VALTREX) 1000 MG TABLET    2 tablets twice a day for 1 day as needed for cold sores   ZOLPIDEM (AMBIEN) 10 MG TABLET    Take 1 tablet (10 mg total) by mouth at bedtime as needed for sleep.    Review of Systems  Constitutional: Negative for fever, chills, appetite change and fatigue.  HENT: Positive for congestion, hoarse voice, sinus pressure, sore throat and trouble swallowing. Negative for drooling, ear discharge and ear pain.        Ear congestion bilateral  Respiratory: Positive for cough and wheezing. Negative for chest tightness, shortness of breath and stridor.   Cardiovascular: Negative for chest pain and palpitations.  Gastrointestinal: Negative for nausea, vomiting, abdominal pain and diarrhea.  Musculoskeletal: Positive for neck pain.  Neurological: Positive for headaches. Negative for dizziness and weakness.    Social History  Substance Use Topics  . Smoking status: Never Smoker   . Smokeless tobacco: Not on file  . Alcohol Use: 0.0 oz/week    0 Standard drinks or equivalent per week     Comment: occasional   Objective:   BP 140/80 mmHg  Pulse 66  Temp(Src) 99.1 F (37.3 C) (Oral)  Resp 16   Ht 5' 2.5" (1.588 m)  Wt 109 lb (49.442 kg)  BMI 19.61 kg/m2  SpO2 98%  Physical Exam  General Appearance:    Alert, cooperative, no distress  HENT:   bilateral TM normal without fluid or infection, neck without nodes, frontal and maxillary sinuses tender and nasal mucosa pale and congested  Eyes:    PERRL, conjunctiva/corneas clear, EOM's intact       Lungs:     Clear to auscultation bilaterally, respirations unlabored  Heart:    Regular rate and rhythm  Neurologic:   Awake, alert, oriented x 3. No apparent focal neurological           defect.           Assessment & Plan:     1. Acute recurrent frontal sinusitis  - amoxicillin-clavulanate (AUGMENTIN) 875-125 MG tablet; Take 1 tablet by mouth 2 (two) times daily.  Dispense: 20 tablet; Refill: 0  2. Essential (primary) hypertension Well controlled.  Continue current medications.   - Comprehensive metabolic panel  3. Right thyroid nodule  - T4 AND TSH  4. Gastroesophageal reflux disease, esophagitis presence not specified Switched from PPI to H2 blocker due to decline in renal functions. Stable        Lelon Huh, MD  [facility]

## 2015-07-22 NOTE — Telephone Encounter (Signed)
Pt called saying the [facility] does not have the Augmentin.    Can you give her a Zpac instead.   Her call back is [phone].  Thanks Con Memos

## 2015-07-22 NOTE — Telephone Encounter (Signed)
Patient stated that it will take 2 days before they have Augmentin 875 in stock. Patient was requesting another medication.

## 2015-07-23 ENCOUNTER — Encounter: Payer: Self-pay | Admitting: Family Medicine

## 2015-07-23 ENCOUNTER — Telehealth: Payer: Self-pay | Admitting: *Deleted

## 2015-07-23 LAB — T4 AND TSH
T4 TOTAL: 4.9 ug/dL (ref 4.5–12.0)
TSH: 0.999 u[IU]/mL (ref 0.450–4.500)

## 2015-07-23 LAB — COMPREHENSIVE METABOLIC PANEL
A/G RATIO: 2.6 — AB (ref 1.2–2.2)
ALK PHOS: 126 IU/L — AB (ref 39–117)
ALT: 17 IU/L (ref 0–32)
AST: 22 IU/L (ref 0–40)
Albumin: 4.6 g/dL (ref 3.6–4.8)
BUN/Creatinine Ratio: 17 (ref 11–26)
BUN: 23 mg/dL (ref 8–27)
CHLORIDE: 94 mmol/L — AB (ref 96–106)
CO2: 22 mmol/L (ref 18–29)
Calcium: 9.7 mg/dL (ref 8.7–10.3)
Creatinine, Ser: 1.32 mg/dL — ABNORMAL HIGH (ref 0.57–1.00)
GFR calc Af Amer: 48 mL/min/{1.73_m2} — ABNORMAL LOW (ref >59)
GFR calc non Af Amer: 42 mL/min/{1.73_m2} — ABNORMAL LOW (ref >59)
GLOBULIN, TOTAL: 1.8 g/dL (ref 1.5–4.5)
Glucose: 107 mg/dL — ABNORMAL HIGH (ref 65–99)
POTASSIUM: 5.3 mmol/L — AB (ref 3.5–5.2)
SODIUM: 134 mmol/L (ref 134–144)
Total Protein: 6.4 g/dL (ref 6.0–8.5)

## 2015-07-23 NOTE — Telephone Encounter (Signed)
Changed to zpack

## 2015-07-23 NOTE — Telephone Encounter (Signed)
Patient is requesting a cough syrup to help her rest at night. Also pt wanted to know if Dr. Caryn Section can send in amoxicillin 1000 mg to [facility] at [address]? Please advise?

## 2015-07-25 ENCOUNTER — Other Ambulatory Visit: Payer: Self-pay | Admitting: Family Medicine

## 2015-07-25 MED ORDER — GUAIFENESIN-CODEINE 100-10 MG/5ML PO SOLN: 5.0000 mL | Freq: Every evening | ORAL | Status: AC | PRN

## 2015-07-25 NOTE — Telephone Encounter (Signed)
RX called in and Amox was already sent in. Pt advised-aa

## 2015-07-25 NOTE — Telephone Encounter (Signed)
Can call in robitussin [facility]

## 2015-07-25 NOTE — Telephone Encounter (Signed)
Rx called in to pharmacy. 

## 2015-08-08 ENCOUNTER — Encounter: Payer: Self-pay | Admitting: Family Medicine

## 2015-08-10 ENCOUNTER — Other Ambulatory Visit: Payer: Self-pay | Admitting: Family Medicine

## 2015-08-15 ENCOUNTER — Encounter: Payer: Self-pay | Admitting: Family Medicine

## 2015-08-18 ENCOUNTER — Other Ambulatory Visit: Payer: Self-pay | Admitting: Family Medicine

## 2015-08-25 ENCOUNTER — Other Ambulatory Visit: Payer: Self-pay | Admitting: Family Medicine

## 2015-09-05 ENCOUNTER — Other Ambulatory Visit: Payer: Self-pay | Admitting: Family Medicine

## 2015-09-05 ENCOUNTER — Telehealth: Payer: Self-pay | Admitting: Family Medicine

## 2015-09-05 MED ORDER — BUTALBITAL-APAP-CAFFEINE 50-325-40 MG PO TABS: ORAL_TABLET | ORAL | Status: DC

## 2015-09-15 ENCOUNTER — Other Ambulatory Visit: Payer: Self-pay | Admitting: Family Medicine

## 2015-09-16 ENCOUNTER — Other Ambulatory Visit: Payer: Self-pay | Admitting: Family Medicine

## 2015-09-16 NOTE — Telephone Encounter (Signed)
Rx called in to pharmacy. 

## 2015-09-16 NOTE — Telephone Encounter (Signed)
Please call in butalbital.  

## 2015-09-17 ENCOUNTER — Other Ambulatory Visit: Payer: Self-pay | Admitting: Family Medicine

## 2015-09-26 ENCOUNTER — Telehealth: Payer: Self-pay | Admitting: Family Medicine

## 2015-09-26 NOTE — Telephone Encounter (Signed)
[place] that you call some drops and ointment into [facility] (you have prescribed in the past) for pink eye.Her right eye is red,burning.[facility] Phone # [phone].Pt can be reached on mobile that is listed

## 2015-09-26 NOTE — Telephone Encounter (Signed)
Please review, does she need to see someone there for this?-aa

## 2015-09-27 MED ORDER — CIPROFLOXACIN HCL 0.3 % OP SOLN
1.0000 [drp] | OPHTHALMIC | Status: AC
Start: 2015-09-27 — End: 2015-10-04

## 2015-10-01 ENCOUNTER — Other Ambulatory Visit: Payer: Self-pay | Admitting: Family Medicine

## 2015-10-01 NOTE — Telephone Encounter (Signed)
Please call in lorazepam.  

## 2015-10-02 NOTE — Telephone Encounter (Signed)
Rx called in to pharmacy. 

## 2015-10-02 NOTE — Telephone Encounter (Signed)
Already called into pharmacy this morning.

## 2015-10-02 NOTE — Telephone Encounter (Signed)
Pt called saying the pharmacy has not heard back on refill request for   LORazepam (ATIVAN) 1 MG tablet   Please advise.  Thanks Amy [name]

## 2015-10-02 NOTE — Telephone Encounter (Signed)
Please call in lorazepam.  

## 2015-10-13 ENCOUNTER — Ambulatory Visit (INDEPENDENT_AMBULATORY_CARE_PROVIDER_SITE_OTHER): Payer: BLUE CROSS/BLUE SHIELD | Admitting: Family Medicine

## 2015-10-13 ENCOUNTER — Encounter: Payer: Self-pay | Admitting: Family Medicine

## 2015-10-13 VITALS — BP 150/78 | HR 64 | Temp 98.6°F | Resp 16 | Wt 115.0 lb

## 2015-10-13 DIAGNOSIS — M545 Low back pain: Secondary | ICD-10-CM

## 2015-10-13 LAB — POCT URINALYSIS DIPSTICK
Bilirubin, UA: NEGATIVE
Blood, UA: NEGATIVE
Glucose, UA: NEGATIVE
Ketones, UA: NEGATIVE
LEUKOCYTES UA: NEGATIVE
NITRITE UA: NEGATIVE
PH UA: 6
Spec Grav, UA: 1.02
Urobilinogen, UA: 0.2

## 2015-10-13 MED ORDER — CYCLOBENZAPRINE HCL 5 MG PO TABS: 5.0000 mg | ORAL_TABLET | Freq: Every day | ORAL | Status: AC

## 2015-10-13 MED ORDER — PREDNISONE 10 MG PO TABS: ORAL_TABLET | ORAL | Status: AC

## 2015-10-13 NOTE — Progress Notes (Signed)
Patient: Amy [name] Female    DOB: Oct 16, 1950   65 y.o.   MRN: [id number] Visit Date: 10/13/2015  Today's Provider: Lelon Huh, MD   Chief Complaint  Patient presents with  . Back Pain   Subjective:    Back Pain This is a new problem. Episode onset: 2 days ago. The problem occurs constantly. The problem has been gradually worsening since onset. The pain is present in the lumbar spine. The quality of the pain is described as burning. Radiates to: buttocks. The pain is severe. Exacerbated by: certain movements. Pertinent negatives include no abdominal pain, bladder incontinence, bowel incontinence, chest pain, dysuria, fever, headaches, leg pain, numbness, paresis, paresthesias, pelvic pain, perianal numbness, tingling, weakness or weight loss. Risk factors: history of back problems. She has tried NSAIDs for the symptoms. The treatment provided no relief.  Patient states the pain is sometimes severe that if she moves a certain way it takes her breath away.   Taken some Advil gelcaps which doesn't proved much relief. .      Allergies  Allergen Reactions  . Cephalosporins   . Clarithromycin   . Lunesta  [Eszopiclone]     Heart racing  . Penicillin V     Not allerrgic to augmentin  . Sulfa Antibiotics    Current Meds  Medication Sig  . atenolol (TENORMIN) 50 MG tablet TAKE 1 TABLET BY MOUTH EVERY DAY  . buPROPion (WELLBUTRIN) 75 MG tablet TAKE 2 TABLETS BY MOUTH DAILY  . butalbital-acetaminophen-caffeine (FIORICET, ESGIC) 50-325-40 MG tablet TAKE 1 TABLET BY MOUTH EVERY 6 HOURS AS NEEDED. MAX OF 8 TABLETS DAILY.  . Diphenhyd-Hydrocort-Nystatin (FIRST-DUKES MOUTHWASH) SUSP Use as directed 10 mLs in the mouth or throat every 4 (four) hours as needed.  . fluticasone (FLONASE) 50 MCG/ACT nasal spray Place 1-2 sprays into both nostrils daily.  . furosemide (LASIX) 20 MG tablet TAKE 1 TABLET BY MOUTH EVERY DAY AS NEEDED FOR SWELLING  . lisinopril-hydrochlorothiazide  (PRINZIDE,ZESTORETIC) 20-12.5 MG tablet Take 1 tablet by mouth daily.  [name] LORazepam (ATIVAN) 1 MG tablet TAKE 1 TABLET BY MOUTH TWICE A DAY AS NEEDED  . ranitidine (ZANTAC) 150 MG tablet Take 1 tablet (150 mg total) by mouth 2 (two) times daily.  [name] zolpidem (AMBIEN) 10 MG tablet TAKE 1 TABLET BY MOUTH EVERY NIGHT AT BEDTIME AS NEEDED FOR SLEEP    Review of Systems  Constitutional: Positive for fatigue. Negative for fever, chills, weight loss and diaphoresis.  Cardiovascular: Negative for chest pain.  Gastrointestinal: Negative for abdominal pain and bowel incontinence.  Genitourinary: Negative for bladder incontinence, dysuria and pelvic pain.  Musculoskeletal: Positive for myalgias (lower back muscles) and back pain.  Neurological: Negative for tingling, weakness, numbness, headaches and paresthesias.   Patient Active Problem List   Diagnosis Date Noted  . Multiple thyroid nodules 07/04/2015  . Right thyroid nodule 06/27/2015  . Allergic rhinitis, seasonal 12/03/2014  . Estrogen deficiency 12/03/2014  . Fever blister 12/03/2014  . Herniated lumbar intervertebral disc 12/03/2014  . Hypokalemia 12/03/2014  . Recurrent sinus infections 12/03/2014  . Insomnia 11/05/2014  . Acquired scoliosis 06/26/2014  . Intervertebral disc stenosis of neural canal 06/20/2014  . Degenerative spondylolisthesis 03/12/2014  . OP (osteoporosis) 08/10/2013  . Idiopathic peripheral neuropathy ([place]) 03/24/2009  . Edema 12/27/2007  . Headache, tension-type 10/25/2006  . Essential (primary) hypertension 10/24/2006  . Anxiety 05/03/1998  . Esophageal reflux 05/03/1998     Social History  Substance Use Topics  .  Smoking status: Never Smoker   . Smokeless tobacco: Not on file  . Alcohol Use: 0.0 oz/week    0 Standard drinks or equivalent per week     Comment: occasional   Objective:   BP 150/78 mmHg  Pulse 64  Temp(Src) 98.6 F (37 C) (Oral)  Resp 16  Wt 115 lb (52.164 kg)  SpO2 100%  Physical  Exam   General Appearance:    Alert, cooperative, no distress  Eyes:    PERRL, conjunctiva/corneas clear, EOM's intact       Lungs:     Clear to auscultation bilaterally, respirations unlabored  Heart:    Regular rate and rhythm  Neurologic:   Awake, alert, oriented x 3. No apparent focal neurological           defect.   MS:   Tender over lumbar spine and paralumbar muscles. Normal Muscle strength and tone.    Results for orders placed or performed in visit on 10/13/15  POCT urinalysis dipstick  Result Value Ref Range   Color, UA yellow    Clarity, UA clear    Glucose, UA negative    Bilirubin, UA negative    Ketones, UA negative    Spec Grav, UA 1.020    Blood, UA negative    pH, UA 6.0    Protein, UA + 30    Urobilinogen, UA 0.2    Nitrite, UA Negative    Leukocytes, UA Negative Negative       Assessment & Plan:     1. Low back pain, unspecified back pain laterality, with sciatica presence unspecified Call if symptoms change or if not rapidly improving.     - POCT urinalysis dipstick - predniSONE (DELTASONE) 10 MG tablet; 4 tablets a day for 3 days, then 3 daily for 3 days, then 2 daily for 3 days, then 1 daily for 3 days  Dispense: 30 tablet; Refill: 0 - cyclobenzaprine (FLEXERIL) 5 MG tablet; Take 1-2 tablets (5-10 mg total) by mouth at bedtime.  Dispense: 30 tablet; Refill: 0      The entirety of the information documented in the History of Present Illness, Review of Systems and Physical Exam were personally obtained by me. Portions of this information were initially documented by Meyer Cory, CMA and reviewed by me for thoroughness and accuracy.    Lelon Huh, MD  [facility]

## 2015-10-14 ENCOUNTER — Other Ambulatory Visit: Payer: Self-pay | Admitting: Family Medicine

## 2015-10-16 ENCOUNTER — Telehealth: Payer: Self-pay | Admitting: Family Medicine

## 2015-10-16 MED ORDER — AMOXICILLIN-POT CLAVULANATE 875-125 MG PO TABS: 1.0000 | ORAL_TABLET | Freq: Two times a day (BID) | ORAL | Status: AC

## 2015-10-16 NOTE — Telephone Encounter (Signed)
Pt was just in Monday for her back.  She has since came down with a sinus infection.  She would like for you to prescribe an antibiotic for her.   She is allergic to sulfur drugs.  She uses [facility] [address]  Her call back is [phone]  Thanks Con [place]

## 2015-10-16 NOTE — Telephone Encounter (Signed)
Please advise 

## 2015-10-28 ENCOUNTER — Telehealth: Payer: Self-pay

## 2015-10-28 ENCOUNTER — Ambulatory Visit
Admission: RE | Admit: 2015-10-28 | Discharge: 2015-10-28 | Disposition: A | Discharge status: Home / Self Care | Payer: BLUE CROSS/BLUE SHIELD | Source: Ambulatory Visit | Attending: Physician Assistant | Admitting: Physician Assistant

## 2015-10-28 ENCOUNTER — Encounter: Payer: Self-pay | Admitting: Physician Assistant

## 2015-10-28 ENCOUNTER — Ambulatory Visit (INDEPENDENT_AMBULATORY_CARE_PROVIDER_SITE_OTHER): Payer: BLUE CROSS/BLUE SHIELD | Admitting: Physician Assistant

## 2015-10-28 VITALS — BP 140/70 | HR 68 | Temp 98.1°F | Resp 16 | Wt 119.6 lb

## 2015-10-28 DIAGNOSIS — M79671 Pain in right foot: Secondary | ICD-10-CM

## 2015-10-28 DIAGNOSIS — J0101 Acute recurrent maxillary sinusitis: Secondary | ICD-10-CM

## 2015-10-28 MED ORDER — MELOXICAM 7.5 MG PO TABS: 7.5000 mg | ORAL_TABLET | Freq: Two times a day (BID) | ORAL | Status: DC

## 2015-10-28 MED ORDER — AMOXICILLIN-POT CLAVULANATE 875-125 MG PO TABS: 1.0000 | ORAL_TABLET | Freq: Two times a day (BID) | ORAL | Status: DC

## 2015-10-28 NOTE — Patient Instructions (Signed)

## 2015-10-28 NOTE — Progress Notes (Signed)
Patient: Amy [name] Female    DOB: 07-30-1950   65 y.o.   MRN: [id number] Visit Date: 10/28/2015  Today's Provider: Mar Daring, PA-C   Chief Complaint  Patient presents with  . Sore Throat  . Foot Pain   Subjective:    Sore Throat  This is a new (She reports that she has chronic Sinusitis) problem. The problem has been gradually worsening. There has been no fever. Associated symptoms include congestion, coughing (some with walking up the stairs) and headaches. Pertinent negatives include no abdominal pain or trouble swallowing. Treatments tried: Advil sinus cold. The treatment provided no relief.  Foot Pain This is a new (Right foot) problem. The current episode started 1 to 4 weeks ago (started 2 weeks ago; but states that just over a year ago she sprained same ankle pretty severely). The problem occurs constantly. The problem has been gradually worsening. Associated symptoms include arthralgias (right foot), congestion, coughing (some with walking up the stairs), headaches and a sore throat (from the postnasal drip). Pertinent negatives include no abdominal pain, chest pain, fatigue, fever, myalgias, nausea, numbness, rash or weakness. Associated symptoms comments: Swelling of lower extremities bilaterally. The symptoms are aggravated by walking. She has tried NSAIDs and lying down (Elevation at night and advil) for the symptoms. The treatment provided no relief (helps edema but not foot pain).  Can't wear normal shoes; has to wear flip flops. Hurts to bear weight barefoot.    Allergies  Allergen Reactions  . Cephalosporins   . Clarithromycin   . Lunesta  [Eszopiclone]     Heart racing  . Penicillin V     Not allerrgic to augmentin  . Sulfa Antibiotics    Current Meds  Medication Sig  . atenolol (TENORMIN) 50 MG tablet TAKE 1 TABLET BY MOUTH EVERY DAY  . buPROPion (WELLBUTRIN) 75 MG tablet TAKE 2 TABLETS BY MOUTH DAILY  . butalbital-acetaminophen-caffeine  (FIORICET, ESGIC) 50-325-40 MG tablet TAKE 1 TABLET BY MOUTH EVERY 6 HOURS AS NEEDED. MAX OF 8 TABLETS DAILY.  . cyclobenzaprine (FLEXERIL) 5 MG tablet Take 1-2 tablets (5-10 mg total) by mouth at bedtime.  . Diphenhyd-Hydrocort-Nystatin (FIRST-DUKES MOUTHWASH) SUSP Use as directed 10 mLs in the mouth or throat every 4 (four) hours as needed.  . fluticasone (FLONASE) 50 MCG/ACT nasal spray Place 1-2 sprays into both nostrils daily.  . furosemide (LASIX) 20 MG tablet TAKE 1 TABLET BY MOUTH EVERY DAY AS NEEDED FOR SWELLING  . lisinopril-hydrochlorothiazide (PRINZIDE,ZESTORETIC) 20-12.5 MG tablet Take 1 tablet by mouth daily.  [name] LORazepam (ATIVAN) 1 MG tablet TAKE 1 TABLET BY MOUTH TWICE A DAY AS NEEDED  . ranitidine (ZANTAC) 150 MG tablet Take 1 tablet (150 mg total) by mouth 2 (two) times daily.  [name] zolpidem (AMBIEN) 10 MG tablet TAKE 1 TABLET BY MOUTH EVERY NIGHT AT BEDTIME AS NEEDED FOR SLEEP    Review of Systems  Constitutional: Negative for fever and fatigue.  HENT: Positive for congestion, postnasal drip, rhinorrhea, sinus pressure and sore throat (from the postnasal drip). Negative for trouble swallowing.   Respiratory: Positive for cough (some with walking up the stairs). Negative for chest tightness and wheezing.   Cardiovascular: Positive for leg swelling (she came two weeks ago for her "back and also abput her ankle swelling"). Negative for chest pain and palpitations.  Gastrointestinal: Negative for nausea and abdominal pain.  Musculoskeletal: Positive for arthralgias (right foot). Negative for myalgias and gait problem.  Skin: Negative for  rash.  Neurological: Positive for headaches. Negative for weakness and numbness.    Social History  Substance Use Topics  . Smoking status: Never Smoker   . Smokeless tobacco: Not on file  . Alcohol Use: 0.0 oz/week    0 Standard drinks or equivalent per week     Comment: occasional   Objective:   BP 140/70 mmHg  Pulse 68  Temp(Src) 98.1  F (36.7 C) (Oral)  Resp 16  Wt 119 lb 9.6 oz (54.25 kg)  Physical Exam  Constitutional: She appears well-developed and well-nourished. No distress.  HENT:  Head: Normocephalic and atraumatic.  Right Ear: Hearing, tympanic membrane, external ear and ear canal normal.  Left Ear: Hearing, tympanic membrane, external ear and ear canal normal.  Nose: Mucosal edema present. Right sinus exhibits maxillary sinus tenderness. Right sinus exhibits no frontal sinus tenderness. Left sinus exhibits maxillary sinus tenderness. Left sinus exhibits no frontal sinus tenderness.  Mouth/Throat: Uvula is midline, oropharynx is clear and moist and mucous membranes are normal. No oropharyngeal exudate, posterior oropharyngeal edema or posterior oropharyngeal erythema.  Neck: Normal range of motion. Neck supple. No tracheal deviation present. No thyromegaly present.  Cardiovascular: Normal rate, regular rhythm and normal heart sounds.  Exam reveals no gallop and no friction rub.   No murmur heard. Pulmonary/Chest: Effort normal and breath sounds normal. No stridor. No respiratory distress. She has no wheezes. She has no rales.  Musculoskeletal:       Right ankle: She exhibits swelling. She exhibits normal range of motion. No tenderness.       Left ankle: She exhibits swelling. She exhibits normal range of motion. No tenderness.       Right foot: There is tenderness (tender over 2nd-3rd metatarsal heads), bony tenderness (tender over base of 5th metatarsal) and swelling. There is normal range of motion and normal capillary refill.       Left foot: There is swelling. There is normal range of motion, no tenderness, no bony tenderness and normal capillary refill.  Lymphadenopathy:    She has no cervical adenopathy.  Skin: She is not diaphoretic.  Vitals reviewed.     Assessment & Plan:     1. Acute recurrent maxillary sinusitis Worsening symptoms acute on chronic sinusitis. Will treat with augmentin as below as  this works well for her. She is to call if symptoms fail to improve or worsen. - amoxicillin-clavulanate (AUGMENTIN) 875-125 MG tablet; Take 1 tablet by mouth 2 (two) times daily.  Dispense: 20 tablet; Refill: 0  2. Right foot pain Will get xray to r/o stress fracture on base of the 5th. Meloxicam as below for pain and inflammation. Will f/u pending xray results. - DG Foot Complete Right; Future - meloxicam (MOBIC) 7.5 MG tablet; Take 1 tablet (7.5 mg total) by mouth 2 (two) times daily.  Dispense: 60 tablet; Refill: 0       Mar Daring, PA-C  [facility]

## 2015-10-28 NOTE — Telephone Encounter (Signed)
Patient advised as directed below. Voiced understanding.  Thanks,  -Joseline 

## 2015-10-28 NOTE — Telephone Encounter (Signed)
-----   Message from Mar Daring, [place] sent at 10/28/2015 10:44 AM EDT ----- Some arthritis noted at 1st and 2nd toes. Mild thinning of bone (osteopenia) noted. No fracture or stress fracture. Continue conservative care with anti-inflammatories and elevating when possible. If no improvement within 2-4 weeks please call and can refer to podiatry anyways for further evaluation.

## 2015-11-08 ENCOUNTER — Other Ambulatory Visit: Payer: Self-pay | Admitting: Family Medicine

## 2015-11-10 ENCOUNTER — Other Ambulatory Visit: Payer: Self-pay | Admitting: Family Medicine

## 2015-11-10 DIAGNOSIS — G4700 Insomnia, unspecified: Secondary | ICD-10-CM

## 2015-11-10 NOTE — Telephone Encounter (Signed)
Please call in zolpidem  

## 2015-11-11 ENCOUNTER — Telehealth: Payer: Self-pay | Admitting: Family Medicine

## 2015-11-11 DIAGNOSIS — J0101 Acute recurrent maxillary sinusitis: Secondary | ICD-10-CM

## 2015-11-11 MED ORDER — AMOXICILLIN-POT CLAVULANATE 875-125 MG PO TABS: 1.0000 | ORAL_TABLET | Freq: Two times a day (BID) | ORAL | Status: DC

## 2015-11-11 NOTE — Telephone Encounter (Signed)
Pt states she was in 2 weeks ago and seen [facility] for a sinus congestion.  Pt states she rec'd a Rx and has taken all that was given.  Pt states she is still having sinus congestion.  Pt is having ear pain and a headache.  Pt is requesting another Rx.  [place].  CB#[id number]

## 2015-11-17 ENCOUNTER — Other Ambulatory Visit: Payer: Self-pay | Admitting: Family Medicine

## 2015-11-17 NOTE — Telephone Encounter (Signed)
Please call in Fioricet.  

## 2015-12-03 ENCOUNTER — Other Ambulatory Visit: Payer: Self-pay | Admitting: Family Medicine

## 2015-12-04 NOTE — Telephone Encounter (Signed)
Please call in Fioricet.  

## 2015-12-05 NOTE — Telephone Encounter (Signed)
Pt needs to get a refill on her Fioricet.  She uses [facility] [address]  She call the pharmacy and they have not gotten the ok yet as of 9:30 am 8/4.  Patient is at [facility] but they will transfer RX.   Thanks [employer]

## 2015-12-05 NOTE — Telephone Encounter (Signed)
Med called in  Pt informed

## 2015-12-11 ENCOUNTER — Ambulatory Visit (INDEPENDENT_AMBULATORY_CARE_PROVIDER_SITE_OTHER): Payer: BLUE CROSS/BLUE SHIELD | Admitting: Family Medicine

## 2015-12-11 ENCOUNTER — Encounter: Payer: Self-pay | Admitting: Family Medicine

## 2015-12-11 VITALS — BP 148/80 | HR 64 | Temp 98.7°F | Resp 16 | Ht 63.0 in | Wt 117.0 lb

## 2015-12-11 DIAGNOSIS — J0101 Acute recurrent maxillary sinusitis: Secondary | ICD-10-CM

## 2015-12-11 DIAGNOSIS — I10 Essential (primary) hypertension: Secondary | ICD-10-CM | POA: Diagnosis not present

## 2015-12-11 DIAGNOSIS — Z0000 Encounter for general adult medical examination without abnormal findings: Secondary | ICD-10-CM

## 2015-12-11 DIAGNOSIS — M4310 Spondylolisthesis, site unspecified: Secondary | ICD-10-CM

## 2015-12-11 DIAGNOSIS — E041 Nontoxic single thyroid nodule: Secondary | ICD-10-CM

## 2015-12-11 DIAGNOSIS — R519 Headache, unspecified: Secondary | ICD-10-CM

## 2015-12-11 DIAGNOSIS — M810 Age-related osteoporosis without current pathological fracture: Secondary | ICD-10-CM | POA: Diagnosis not present

## 2015-12-11 DIAGNOSIS — Z1211 Encounter for screening for malignant neoplasm of colon: Secondary | ICD-10-CM

## 2015-12-11 DIAGNOSIS — K219 Gastro-esophageal reflux disease without esophagitis: Secondary | ICD-10-CM

## 2015-12-11 DIAGNOSIS — R51 Headache: Secondary | ICD-10-CM

## 2015-12-11 MED ORDER — ALENDRONATE SODIUM 70 MG PO TABS: 70.0000 mg | ORAL_TABLET | ORAL | 4 refills | Status: DC

## 2015-12-11 MED ORDER — BUTALBITAL-APAP-CAFFEINE 50-325-40 MG PO TABS: ORAL_TABLET | ORAL | 2 refills | Status: DC

## 2015-12-11 MED ORDER — AMOXICILLIN-POT CLAVULANATE ER 1000-62.5 MG PO TB12: 2.0000 | ORAL_TABLET | Freq: Two times a day (BID) | ORAL | 0 refills | Status: DC

## 2015-12-11 NOTE — Progress Notes (Signed)
Patient: Amy [name], Female    DOB: March 16, 1951, 65 y.o.   MRN: [id number] Visit Date: 12/11/2015  Today's Provider: Lelon Huh, MD   Chief Complaint  Patient presents with  . Annual Exam  . Hypertension  . Gastroesophageal Reflux   Subjective:    Annual physical exam Amy [name] is a 65 y.o. female who presents today for health maintenance and complete physical. She feels well. She reports exercising yes/some walking. She reports she is sleeping poorly.  ----------------------------------------------------------------  Chronic kidney disease, unspecified stage From 01/21/2015-Counseled to avoid NSAIDs and drink plenty of water.   Gastroesophageal reflux disease, esophagitis presence not specified: Has changed from OTC omeprazole to ranitidine and cimetidine due to decline in kidney functions Is only taking prn a few times a week and working well.   Sinusitis  States sinus pressure improved but did not resolve after recent course of augmentin 875, and is not having more chest congestion in her chest in addition to pressure in her sinuses, and feeling a little dizzy.   Follow up osteroporosis She was prescribed fosamax in May of 2015, but she does not recall taking it and it was never refilled. She is interested in treatment for this condition as it runs in her family.    Hypertension, follow-up:  BP Readings from Last 3 Encounters:  12/11/15 (!) 160/88  10/28/15 140/70  10/13/15 (!) 150/78    She was last seen for hypertension 11 months ago.  BP at that visit was 160/90. Management since that visit includes; Counseled to avoid salty foods .She reports good compliance with treatment. She is not having side effects. none She is exercising. She is adherent to low salt diet.   Outside blood pressures are n/a. She is experiencing none.  Patient denies none.   Cardiovascular risk factors include none.  Use of agents associated with hypertension: none.    ----------------------------------------------------------------     Review of Systems  Constitutional: Negative.   HENT: Negative.   Eyes: Negative.   Respiratory: Negative.   Cardiovascular: Positive for leg swelling.  Gastrointestinal: Negative.   Endocrine: Negative.   Genitourinary: Negative.   Musculoskeletal: Negative.   Skin: Negative.   Allergic/Immunologic: Negative.   Neurological: Negative.   Hematological: Negative.   Psychiatric/Behavioral: Negative.     Social History      She  reports that she has never smoked. She does not have any smokeless tobacco history on file. She reports that she drinks alcohol. She reports that she does not use drugs.       Social History   Social History  . Marital status: Married    Spouse name: N/A  . Number of children: 1  . Years of education: N/A   Occupational History  . Has Desk job     Employed   Social History Main Topics  . Smoking status: Never Smoker  . Smokeless tobacco: None  . Alcohol use 0.0 oz/week     Comment: occasional  . Drug use: No  . Sexual activity: Not Asked   Other Topics Concern  . None   Social History Narrative  . None    Past Medical History:  Diagnosis Date  . History of chicken pox   . History of measles as a child      Patient Active Problem List   Diagnosis Date Noted  . Multiple thyroid nodules 07/04/2015  . Right thyroid nodule 06/27/2015  . Allergic rhinitis, seasonal 12/03/2014  .  Estrogen deficiency 12/03/2014  . Fever blister 12/03/2014  . Herniated lumbar intervertebral disc 12/03/2014  . Hypokalemia 12/03/2014  . Recurrent sinus infections 12/03/2014  . Insomnia 11/05/2014  . Acquired scoliosis 06/26/2014  . Intervertebral disc stenosis of neural canal 06/20/2014  . Degenerative spondylolisthesis 03/12/2014  . OP (osteoporosis) 08/10/2013  . Idiopathic peripheral neuropathy ([place]) 03/24/2009  . Edema 12/27/2007  . Headache, tension-type 10/25/2006  .  Essential (primary) hypertension 10/24/2006  . Anxiety 05/03/1998  . Esophageal reflux 05/03/1998    Past Surgical History:  Procedure Laterality Date  . ABDOMINAL HYSTERECTOMY  2003   due to heavy periods and clotting during menses  . BREAST LUMPECTOMY Left 1990  . CHOLECYSTECTOMY  2010  . DILATION AND CURETTAGE OF UTERUS  1985  . GALLBLADDER SURGERY  2012  . KNEE SURGERY     Dr. Tamala Julian  . MRI, Lumbar spine  07/23/2009   Multilevel annular bulge and disc protrusion at L2-L3, L3-L4, L4-L5, and L5-S1  . TONSILLECTOMY AND ADENOIDECTOMY  1970  . UPPER GI ENDOSCOPY  03/02/2006   H. Pylori negative; Dr. Allen Norris    Family History        Family Status  Relation Status  . Mother Deceased  . Father Deceased at age 60   pancreatic cancer  . Maternal Grandmother Deceased   CHF  . Paternal Grandfather Deceased  . Maternal Uncle         Her family history includes Cancer in her paternal grandfather; Congestive Heart Failure in her maternal grandmother; Heart disease in her maternal uncle; Pancreatic cancer in her father.    Allergies  Allergen Reactions  . Cephalosporins   . Clarithromycin   . Lunesta  [Eszopiclone]     Heart racing  . Penicillin V     Not allerrgic to augmentin  . Sulfa Antibiotics     Current Meds  Medication Sig  . atenolol (TENORMIN) 50 MG tablet TAKE 1 TABLET BY MOUTH EVERY DAY  . buPROPion (WELLBUTRIN) 75 MG tablet TAKE 2 TABLETS BY MOUTH DAILY  . butalbital-acetaminophen-caffeine (FIORICET, ESGIC) 50-325-40 MG tablet TAKE 1 TABLET BY MOUTH EVERY 6 HOURS AS NEEDED FOR PAIN. MAXIMUM 8 TABLETS A DAY  . Cimetidine (TAGAMET PO) Take by mouth 3 times/day as needed-between meals & bedtime.  . fluticasone (FLONASE) 50 MCG/ACT nasal spray Place 1-2 sprays into both nostrils daily.  . furosemide (LASIX) 20 MG tablet TAKE 1 TABLET BY MOUTH EVERY DAY AS NEEDED FOR SWELLING  . lisinopril-hydrochlorothiazide (PRINZIDE,ZESTORETIC) 20-12.5 MG tablet Take 1 tablet by  mouth daily.  [name] LORazepam (ATIVAN) 1 MG tablet TAKE 1 TABLET BY MOUTH TWICE A DAY AS NEEDED  . valACYclovir (VALTREX) 1000 MG tablet 2 tablets twice a day for 1 day as needed for cold sores  . zolpidem (AMBIEN) 10 MG tablet TAKE 1 TABLET BY MOUTH EVERY NIGHT AT BEDTIME AS NEEDED FOR SLEEP    Patient Care Team: [name], MD as PCP - General (Family Medicine)     Objective:   Vitals: BP (!) 160/88 (BP Location: Right Arm, Patient Position: Sitting, Cuff Size: Normal)   Pulse 64   Temp 98.7 F (37.1 C) (Oral)   Resp 16   Ht 5' 3"  (1.6 m)   Wt 117 lb (53.1 kg)   SpO2 98%   BMI 20.73 kg/m    Repeat BP = 148/80 Physical Exam   General Appearance:    Alert, cooperative, no distress, appears stated age  Head:    Normocephalic,  without obvious abnormality, atraumatic  Eyes:    PERRL, conjunctiva/corneas clear, EOM's intact, fundi    benign, both eyes  Ears:    Normal TM's and external ear canals, both ears  Nose:   Nares normal, septum midline, mucosa normal, no drainage    or sinus tenderness. Tender bilateral maxillary sinuses, moderately congested turbinates.   Throat:   Lips, mucosa, and tongue normal; teeth and gums normal  Neck:   Supple, symmetrical, trachea midline, no adenopathy;    thyroid:  no enlargement/tenderness/nodules; no carotid   bruit or JVD  Back:     Symmetric, no curvature, ROM normal, no CVA tenderness  Lungs:     Clear to auscultation bilaterally, respirations unlabored  Chest Wall:    No tenderness or deformity   Heart:    Regular rate and rhythm, S1 and S2 normal, no murmur, rub   or gallop. 1+ bilateral edema to mid calf, no erythema or tennderness.   Breast Exam:    fibroxysts bilaterally, no suspiceous masses  Abdomen:     Soft, non-tender, bowel sounds active all four quadrants,    no masses, no organomegaly  Pelvic:    deferred  Extremities:   Extremities normal, atraumatic, no cyanosis or edema  Pulses:   2+ and symmetric all extremities    Skin:   Skin color, texture, turgor normal, no rashes or lesions  Lymph nodes:   Cervical, supraclavicular, and axillary nodes normal  Neurologic:   CNII-XII intact, normal strength, sensation and reflexes    throughout    Depression Screen PHQ 2/9 Scores 12/11/2015 12/04/2014  PHQ - 2 Score 0 0  PHQ- 9 Score 0 0      Assessment & Plan:     Routine Health Maintenance and Physical Exam  Exercise Activities and Dietary recommendations Goals    None      Immunization History  Administered Date(s) Administered  . Influenza, High Dose Seasonal PF 01/21/2015  . Pneumococcal Conjugate-13 12/04/2014  . Td 08/15/2000  . Tdap 02/02/2012  . Zoster 01/20/2011    Health Maintenance  Topic Date Due  . INFLUENZA VACCINE  12/02/2015  . PNA vac Low Risk Adult (2 of 2 - PPSV23) 12/04/2015  . COLONOSCOPY  03/02/2016  . MAMMOGRAM  08/04/2017  . TETANUS/TDAP  02/01/2022  . DEXA SCAN  Completed  . ZOSTAVAX  Completed  . Hepatitis C Screening  Completed      Discussed health benefits of physical activity, and encouraged her to engage in regular exercise appropriate for her age and condition.    --------------------------------------------------------------------  1. Annual physical exam  - Comprehensive metabolic panel - Lipid panel - T4 AND TSH  2. Essential (primary) hypertension BP up today, See how labs look. Consider increasing lisinopril  3. Gastroesophageal reflux disease, esophagitis presence not specified Well controlled since change from PPI to H2 blocker due to decreased eGFR - Magnesium  4. Right thyroid nodule  - T4 AND TSH  5. Degenerative spondylolisthesis   6. OP (osteoporosis) Start Fosamax 70 weekly - VITAMIN D 25 Hydroxy (Vit-D Deficiency, Fractures) - alendronate (FOSAMAX) 70 MG tablet; Take 1 tablet (70 mg total) by mouth every 7 (seven) days. Take with a full glass of water on an empty stomach.  Dispense: 12 tablet; Refill: 4  7. Chronic  nonintractable headache, unspecified headache type Due for refill for Fioricet which she states continues to work well.  - butalbital-acetaminophen-caffeine (FIORICET, ESGIC) 50-325-40 MG tablet; TAKE 1 TABLET BY MOUTH EVERY 6  HOURS AS NEEDED FOR PAIN. MAXIMUM 8 TABLETS A DAY  Dispense: 150 tablet; Refill: 2  8. Acute recurrent maxillary sinusitis Augmentin XR 1000 2 BId x 10 days  9. Colon cancer screening  - Ambulatory referral to Gastroenterology   Lelon Huh, MD  [facility]

## 2015-12-11 NOTE — Patient Instructions (Signed)
Osteoporosis  Osteoporosis is the thinning and loss of density in the bones. Osteoporosis makes the bones more brittle, fragile, and likely to break (fracture). Over time, osteoporosis can cause the bones to become so weak that they fracture after a simple fall. The bones most likely to fracture are the bones in the hip, wrist, and spine.  CAUSES   The exact cause is not known.  RISK FACTORS  Anyone can develop osteoporosis. You may be at greater risk if you have a family history of the condition or have poor nutrition. You may also have a higher risk if you are:   · Female.    · 50 years old or older.  · A smoker.  · Not physically active.    · White or Asian.  · Slender.  SIGNS AND SYMPTOMS   A fracture might be the first sign of the disease, especially if it results from a fall or injury that would not usually cause a bone to break. Other signs and symptoms include:   · Low back and neck pain.  · Stooped posture.  · Height loss.  DIAGNOSIS   To make a diagnosis, your health care provider may:  · Take a medical history.  · Perform a physical exam.  · Order tests, such as:    A bone mineral density test.    A dual-energy X-ray absorptiometry test.  TREATMENT   The goal of osteoporosis treatment is to strengthen your bones to reduce your risk of a fracture. Treatment may involve:  · Making lifestyle changes, such as:    Eating a diet rich in calcium.    Doing weight-bearing and muscle-strengthening exercises.    Stopping tobacco use.    Limiting alcohol intake.  · Taking medicine to slow the process of bone loss or to increase bone density.  · Monitoring your levels of calcium and vitamin D.  HOME CARE INSTRUCTIONS  · Include calcium and vitamin D in your diet. Calcium is important for bone health, and vitamin D helps the body absorb calcium.  · Perform weight-bearing and muscle-strengthening exercises as directed by your health care provider.  · Do not use any tobacco products, including cigarettes, chewing  tobacco, and electronic cigarettes. If you need help quitting, ask your health care provider.  · Limit your alcohol intake.  · Take medicines only as directed by your health care provider.  · Keep all follow-up visits as directed by your health care provider. This is important.  · Take precautions at home to lower your risk of falling, such as:    Keeping rooms well lit and clutter free.    Installing safety rails on stairs.    Using rubber mats in the bathroom and other areas that are often wet or slippery.  SEEK IMMEDIATE MEDICAL CARE IF:   You fall or injure yourself.      This information is not intended to replace advice given to you by your health care provider. Make sure you discuss any questions you have with your health care provider.     Document Released: 01/27/2005 Document Revised: 05/10/2014 Document Reviewed: 09/27/2013  Elsevier Interactive Patient Education ©2016 [employer]

## 2015-12-12 LAB — COMPREHENSIVE METABOLIC PANEL
ALBUMIN: 4.3 g/dL (ref 3.6–4.8)
ALT: 16 IU/L (ref 0–32)
AST: 25 IU/L (ref 0–40)
Albumin/Globulin Ratio: 2.7 — ABNORMAL HIGH (ref 1.2–2.2)
Alkaline Phosphatase: 106 IU/L (ref 39–117)
BUN / CREAT RATIO: 24 (ref 12–28)
BUN: 36 mg/dL — ABNORMAL HIGH (ref 8–27)
Bilirubin Total: <0.2 mg/dL (ref 0.0–1.2)
CALCIUM: 9.8 mg/dL (ref 8.7–10.3)
CO2: 22 mmol/L (ref 18–29)
CREATININE: 1.5 mg/dL — AB (ref 0.57–1.00)
Chloride: 105 mmol/L (ref 96–106)
GFR calc Af Amer: 41 mL/min/{1.73_m2} — ABNORMAL LOW (ref >59)
GFR, EST NON AFRICAN AMERICAN: 36 mL/min/{1.73_m2} — AB (ref >59)
GLOBULIN, TOTAL: 1.6 g/dL (ref 1.5–4.5)
Glucose: 98 mg/dL (ref 65–99)
Potassium: 4.7 mmol/L (ref 3.5–5.2)
SODIUM: 141 mmol/L (ref 134–144)
TOTAL PROTEIN: 5.9 g/dL — AB (ref 6.0–8.5)

## 2015-12-12 LAB — T4 AND TSH
T4 TOTAL: 4 ug/dL — AB (ref 4.5–12.0)
TSH: 1.35 u[IU]/mL (ref 0.450–4.500)

## 2015-12-12 LAB — LIPID PANEL
CHOL/HDL RATIO: 2.2 ratio (ref 0.0–4.4)
Cholesterol, Total: 195 mg/dL (ref 100–199)
HDL: 88 mg/dL (ref >39)
LDL CALC: 75 mg/dL (ref 0–99)
TRIGLYCERIDES: 160 mg/dL — AB (ref 0–149)
VLDL Cholesterol Cal: 32 mg/dL (ref 5–40)

## 2015-12-12 LAB — VITAMIN D 25 HYDROXY (VIT D DEFICIENCY, FRACTURES): Vit D, 25-Hydroxy: 35.8 ng/mL (ref 30.0–100.0)

## 2015-12-12 LAB — MAGNESIUM: Magnesium: 2 mg/dL (ref 1.6–2.3)

## 2015-12-15 ENCOUNTER — Telehealth: Payer: Self-pay | Admitting: Family Medicine

## 2015-12-15 ENCOUNTER — Other Ambulatory Visit: Payer: Self-pay

## 2015-12-15 MED ORDER — VALSARTAN 160 MG PO TABS: 160.0000 mg | ORAL_TABLET | Freq: Every day | ORAL | 2 refills | Status: DC

## 2015-12-15 NOTE — Telephone Encounter (Signed)
See lab results.  

## 2015-12-15 NOTE — Telephone Encounter (Signed)
Pt is requesting results of lab work.

## 2015-12-15 NOTE — Telephone Encounter (Signed)
Pt advised of labs results and new medication sent in. Renaldo Fiddler, CMA

## 2015-12-16 ENCOUNTER — Other Ambulatory Visit: Payer: Self-pay | Admitting: Family Medicine

## 2015-12-23 ENCOUNTER — Other Ambulatory Visit: Payer: Self-pay | Admitting: *Deleted

## 2015-12-23 ENCOUNTER — Telehealth: Payer: Self-pay

## 2015-12-23 ENCOUNTER — Telehealth: Payer: Self-pay | Admitting: Family Medicine

## 2015-12-23 NOTE — Telephone Encounter (Signed)
Can try irbesartan 150mg  once daily, #30, rf x 1. It is usually well tolerated and very good for kidneys.

## 2015-12-23 NOTE — Telephone Encounter (Signed)
Patient needs to be scheduled for a colonoscopy screening. Called patient 2x and sent a notification letter

## 2015-12-23 NOTE — Telephone Encounter (Signed)
Pt stated that she doesn't think that valsartan (DIOVAN) 160 MG tablet is for her. Pt stated since starting the medication she feels bad and her limbs have been hurting and she would like to try to something else. [place]. Please advise. Thanks TNP

## 2015-12-24 NOTE — Telephone Encounter (Signed)
LM pt to return call.

## 2015-12-25 MED ORDER — IRBESARTAN 150 MG PO TABS: 150.0000 mg | ORAL_TABLET | Freq: Every day | ORAL | 1 refills | Status: DC

## 2015-12-25 NOTE — Telephone Encounter (Signed)
Patient was notified and agreed to try irbesartan 150 mg qd. Rx sent to pharmacy.

## 2015-12-29 ENCOUNTER — Other Ambulatory Visit: Payer: Self-pay | Admitting: Family Medicine

## 2015-12-29 DIAGNOSIS — J0101 Acute recurrent maxillary sinusitis: Secondary | ICD-10-CM

## 2015-12-30 ENCOUNTER — Telehealth: Payer: Self-pay | Admitting: Family Medicine

## 2015-12-30 NOTE — Telephone Encounter (Signed)
Please advise patient that bone density test shows osteoporosis in hip that has worsened since 2015. She needs to start taking Alendronat 70mg  once a week, #12, no refills. She also needs to take OTC Os-Cal with vitamin D twice a day. She should incorporate some weight bearing exercises into routine for about 20 minutes 2-3 times a week. Need to repeat BMD in 2 years.

## 2015-12-30 NOTE — Telephone Encounter (Signed)
Left message to call back  

## 2016-01-01 NOTE — Telephone Encounter (Signed)
Left message to call back  

## 2016-01-02 NOTE — Telephone Encounter (Signed)
Patient was notified of results. Patient expressed understanding. 

## 2016-01-13 ENCOUNTER — Encounter: Payer: Self-pay | Admitting: Family Medicine

## 2016-01-20 ENCOUNTER — Other Ambulatory Visit: Payer: Self-pay | Admitting: Family Medicine

## 2016-01-20 DIAGNOSIS — J0101 Acute recurrent maxillary sinusitis: Secondary | ICD-10-CM

## 2016-01-23 ENCOUNTER — Telehealth: Payer: Self-pay

## 2016-01-23 DIAGNOSIS — J0140 Acute pansinusitis, unspecified: Secondary | ICD-10-CM

## 2016-01-23 MED ORDER — AMOXICILLIN-POT CLAVULANATE 875-125 MG PO TABS: 1.0000 | ORAL_TABLET | Freq: Two times a day (BID) | ORAL | 0 refills | Status: DC

## 2016-01-23 NOTE — Telephone Encounter (Signed)
Patient calling spoke with Everitt Amber. that the Augmentin medication that was prescribed by doctor Fisher on the 01/20/16 is 290 dollars. She reports that she has a new insurance and is really expensive and wants another prescription to be call in for her. Vicenta Dunning with the phone team to inform patient that it will probably be tomorrow or Monday for new prescription.  Thanks,  -Havanna Groner

## 2016-01-23 NOTE — Telephone Encounter (Signed)
Rx sent in

## 2016-01-26 NOTE — Telephone Encounter (Signed)
Advised patient as below.  

## 2016-02-02 ENCOUNTER — Other Ambulatory Visit: Payer: Self-pay | Admitting: Family Medicine

## 2016-02-02 DIAGNOSIS — R51 Headache: Principal | ICD-10-CM

## 2016-02-02 DIAGNOSIS — R519 Headache, unspecified: Secondary | ICD-10-CM

## 2016-02-02 MED ORDER — BUTALBITAL-APAP-CAFFEINE 50-325-40 MG PO TABS: ORAL_TABLET | ORAL | 2 refills | Status: DC

## 2016-02-02 NOTE — Telephone Encounter (Signed)
Please call in Fioricet.  

## 2016-02-02 NOTE — Telephone Encounter (Signed)
Pt is requesting a refill on the following medication, pt  use's [facility] on [address]. Thanks CC   butalbital-acetaminophen-caffeine (FIORICET, ESGIC) 50-325-40 MG tablet

## 2016-02-03 ENCOUNTER — Telehealth: Payer: Self-pay | Admitting: Family Medicine

## 2016-02-03 DIAGNOSIS — R51 Headache: Principal | ICD-10-CM

## 2016-02-03 DIAGNOSIS — R519 Headache, unspecified: Secondary | ICD-10-CM

## 2016-02-03 NOTE — Telephone Encounter (Signed)
Rx called in to pharmacy. 

## 2016-02-03 NOTE — Telephone Encounter (Signed)
Please call in Fioricet.  

## 2016-02-05 ENCOUNTER — Ambulatory Visit (INDEPENDENT_AMBULATORY_CARE_PROVIDER_SITE_OTHER): Payer: 59 | Admitting: Family Medicine

## 2016-02-05 ENCOUNTER — Encounter: Payer: Self-pay | Admitting: Family Medicine

## 2016-02-05 VITALS — BP 160/92 | HR 87 | Temp 98.7°F | Resp 16 | Ht 63.0 in | Wt 112.0 lb

## 2016-02-05 DIAGNOSIS — N183 Chronic kidney disease, stage 3 unspecified: Secondary | ICD-10-CM

## 2016-02-05 DIAGNOSIS — N185 Chronic kidney disease, stage 5: Secondary | ICD-10-CM | POA: Insufficient documentation

## 2016-02-05 DIAGNOSIS — I10 Essential (primary) hypertension: Secondary | ICD-10-CM

## 2016-02-05 DIAGNOSIS — R51 Headache: Secondary | ICD-10-CM

## 2016-02-05 DIAGNOSIS — G8929 Other chronic pain: Secondary | ICD-10-CM

## 2016-02-05 MED ORDER — BUTALBITAL-APAP-CAFFEINE 50-325-40 MG PO TABS: ORAL_TABLET | ORAL | 5 refills | Status: DC

## 2016-02-05 MED ORDER — IRBESARTAN-HYDROCHLOROTHIAZIDE 300-12.5 MG PO TABS: 1.0000 | ORAL_TABLET | Freq: Every day | ORAL | 3 refills | Status: DC

## 2016-02-05 NOTE — Progress Notes (Signed)
Patient: Amy [name] Female    DOB: January 08, 1951   65 y.o.   MRN: [id number] Visit Date: 02/05/2016  Today's Provider: Lelon Huh, MD   Chief Complaint  Patient presents with  . Follow-up  . Hypertension   Subjective:    HPI     Hypertension, follow-up:  BP Readings from Last 3 Encounters:  02/05/16 (!) 180/90  12/11/15 (!) 148/80  10/28/15 140/70    She was last seen for hypertension 2 months ago.  BP at that visit was 140/80. Management since that visit includes; labs done; results showed decline in kidney functions, changed from lisinopril-hctz to Irbesartan 150mg  daily. She reports good compliance with treatment. She is not having side effects. none She is exercising. She is adherent to low salt diet.   Outside blood pressures are n/a. She is experiencing none.  Patient denies none.   Cardiovascular risk factors include none.  Use of agents associated with hypertension: none.   She states she has not felt well the last 2-3 days. Has not been checking BP at home. Of interest is that she ran out of Fioricet 3 days ago, but has been taking 5-8 every day for years. Is not taking any OTC decongestants or NSAIDs.  ----------------------------------------------------------------     Allergies  Allergen Reactions  . Cephalosporins   . Clarithromycin   . Lunesta  [Eszopiclone]     Heart racing  . Penicillin V     Not allerrgic to augmentin  . Sulfa Antibiotics      Current Outpatient Prescriptions:  .  alendronate (FOSAMAX) 70 MG tablet, Take 1 tablet (70 mg total) by mouth every 7 (seven) days. Take with a full glass of water on an empty stomach., Disp: 12 tablet, Rfl: 4 .  atenolol (TENORMIN) 50 MG tablet, TAKE 1 TABLET BY MOUTH EVERY DAY, Disp: 90 tablet, Rfl: 4 .  buPROPion (WELLBUTRIN) 75 MG tablet, TAKE 2 TABLETS BY MOUTH DAILY, Disp: 180 tablet, Rfl: 4 .  butalbital-acetaminophen-caffeine (FIORICET, ESGIC) 50-325-40 MG tablet, TAKE 1 TABLET BY  MOUTH EVERY 6 HOURS AS NEEDED FOR PAIN. MAXIMUM DAILY DOSE IS 8 TABLETS PER DAY, Disp: 150 tablet, Rfl: 0 .  Cimetidine (TAGAMET PO), Take by mouth 3 times/day as needed-between meals & bedtime., Disp: , Rfl:  .  fluticasone (FLONASE) 50 MCG/ACT nasal spray, Place 1-2 sprays into both nostrils daily., Disp: 16 g, Rfl: 5 .  furosemide (LASIX) 20 MG tablet, TAKE 1 TABLET BY MOUTH EVERY DAY AS NEEDED FOR SWELLING, Disp: 20 tablet, Rfl: 5 .  irbesartan (AVAPRO) 150 MG tablet, Take 1 tablet (150 mg total) by mouth daily., Disp: 30 tablet, Rfl: 1 .  LORazepam (ATIVAN) 1 MG tablet, TAKE 1 TABLET BY MOUTH TWICE A DAY AS NEEDED, Disp: 180 tablet, Rfl: 1 .  omeprazole (PRILOSEC) 40 MG capsule, Take 1 capsule (40 mg total) by mouth daily as needed (heartburn)., Disp: 30 capsule, Rfl: 5 .  omeprazole (PRILOSEC) 40 MG capsule, Take 1 capsule by mouth daily., Disp: , Rfl:  .  valACYclovir (VALTREX) 1000 MG tablet, 2 tablets twice a day for 1 day as needed for cold sores, Disp: 30 tablet, Rfl: 1 .  zolpidem (AMBIEN) 10 MG tablet, TAKE 1 TABLET BY MOUTH EVERY NIGHT AT BEDTIME AS NEEDED FOR SLEEP, Disp: 30 tablet, Rfl: 5  Review of Systems  Constitutional: Negative for appetite change, chills, fatigue and fever.  Respiratory: Negative for chest tightness and shortness of breath.  Cardiovascular: Negative for chest pain and palpitations.  Gastrointestinal: Negative for abdominal pain, nausea and vomiting.  Neurological: Negative for dizziness and weakness.    Social History  Substance Use Topics  . Smoking status: Never Smoker  . Smokeless tobacco: Not on file  . Alcohol use 0.0 oz/week     Comment: occasional   Objective:     Vitals:   02/05/16 1150 02/05/16 1229  BP: (!) 180/90 (!) 160/92  Pulse: 87   Resp: 16   Temp: 98.7 F (37.1 C)   TempSrc: Oral   SpO2: 100%   Weight: 112 lb (50.8 kg)   Height: 5\' 3"  (1.6 m)      Physical Exam   General Appearance:    Alert, cooperative, no  distress  Eyes:    PERRL, conjunctiva/corneas clear, EOM's intact       Lungs:     Clear to auscultation bilaterally, respirations unlabored  Heart:    Regular rate and rhythm  Neurologic:   Awake, alert, oriented x 3. No apparent focal neurological           defect.           Assessment & Plan:     1. Chronic nonintractable headache, unspecified headache type Out of Fioricet which was called in today.  - butalbital-acetaminophen-caffeine (FIORICET, ESGIC) 50-325-40 MG tablet; TAKE 1-2 TABLET BY MOUTH EVERY 6 HOURS AS NEEDED FOR PAIN. MAXIMUM DAILY DOSE IS 8 TABLETS PER DAY  Dispense: 150 tablet; Refill: 5  2. Essential (primary) hypertension BP up today. Unclear how much of this is from medication change and how much is fioricet withdrawal. Will change irbesartan to irbesartan-hctz 300-12.5 daily  3. Chronic kidney disease (CKD), active medical management without dialysis, stage 3 (moderate) Recheck renal panel at follow up.   Return in about 3 weeks (around 02/26/2016).        Lelon Huh, MD  [facility]

## 2016-02-05 NOTE — Telephone Encounter (Signed)
Patient has called back regarding the Fioricet.  She says the pharmacy told her  because the "wording" on the prescription has been changed the insurance will not pay for it.  Please advise.  Patients call back is [phone].  Thanks, Con Memos

## 2016-02-06 NOTE — Telephone Encounter (Signed)
Called medication into pharmacy. Patient was advised.

## 2016-02-06 NOTE — Telephone Encounter (Signed)
That's fine

## 2016-02-06 NOTE — Telephone Encounter (Signed)
Contacted the pharmacy, and they report that insurance will only allow up to 6 tablets a day. Ok to resend into the pharmacy? Please advise. Thanks!

## 2016-02-07 ENCOUNTER — Other Ambulatory Visit: Payer: Self-pay | Admitting: Family Medicine

## 2016-02-10 ENCOUNTER — Other Ambulatory Visit: Payer: Self-pay | Admitting: Unknown Physician Specialty

## 2016-02-26 ENCOUNTER — Ambulatory Visit: Payer: 59 | Admitting: Family Medicine

## 2016-03-04 ENCOUNTER — Telehealth: Payer: Self-pay | Admitting: Family Medicine

## 2016-03-04 DIAGNOSIS — J0101 Acute recurrent maxillary sinusitis: Secondary | ICD-10-CM

## 2016-03-04 NOTE — Telephone Encounter (Signed)
Pt is having symptoms of a sinus infection and would like Augmentin called in.  She uses [facility].  Offered her an appt and she said she didn't want to come in.  Her call back is [phone]  Thanks Con Memos

## 2016-03-05 MED ORDER — AMOXICILLIN-POT CLAVULANATE ER 1000-62.5 MG PO TB12: 2.0000 | ORAL_TABLET | Freq: Two times a day (BID) | ORAL | 0 refills | Status: DC

## 2016-03-05 NOTE — Telephone Encounter (Signed)
Please advise 

## 2016-03-16 ENCOUNTER — Other Ambulatory Visit: Payer: Self-pay | Admitting: Family Medicine

## 2016-03-17 NOTE — Telephone Encounter (Signed)
Rx called in to pharmacy. 

## 2016-03-17 NOTE — Telephone Encounter (Signed)
Please call in lorazepam.  

## 2016-03-23 ENCOUNTER — Other Ambulatory Visit: Payer: Self-pay | Admitting: Family Medicine

## 2016-03-23 DIAGNOSIS — R51 Headache: Principal | ICD-10-CM

## 2016-03-23 DIAGNOSIS — R519 Headache, unspecified: Secondary | ICD-10-CM

## 2016-03-23 NOTE — Telephone Encounter (Signed)
There should still be four refills on prescription called in last month. Please check with pharmacy.

## 2016-03-23 NOTE — Telephone Encounter (Signed)
Pt needs refill on   butalbital-acetaminophen-caffeine (FIORICET, ESGIC) 50-325-40 MG   She wants the rx to say take on or two every six hrs.  Pharmacy [facility] [address]  Pt call back is [phone]  Thank sTeri

## 2016-03-23 NOTE — Telephone Encounter (Signed)
Please review. KW 

## 2016-03-23 NOTE — Telephone Encounter (Signed)
Patient has refill available at the pharmacy. Confirmed by pharmacy. Patient was notified.

## 2016-03-23 NOTE — Telephone Encounter (Signed)
Done

## 2016-05-05 ENCOUNTER — Other Ambulatory Visit: Payer: Self-pay | Admitting: Family Medicine

## 2016-05-05 DIAGNOSIS — G4700 Insomnia, unspecified: Secondary | ICD-10-CM

## 2016-05-05 NOTE — Telephone Encounter (Signed)
RX called in at [facility] pharmacy  

## 2016-05-05 NOTE — Telephone Encounter (Signed)
[place].

## 2016-05-05 NOTE — Telephone Encounter (Signed)
Please call in zolpidem  

## 2016-05-10 ENCOUNTER — Encounter: Payer: Self-pay | Admitting: *Deleted

## 2016-05-10 ENCOUNTER — Emergency Department: Payer: Medicare Other

## 2016-05-10 ENCOUNTER — Emergency Department
Admission: EM | Admit: 2016-05-10 | Discharge: 2016-05-10 | Disposition: A | Discharge status: Home / Self Care | Payer: Medicare Other | Attending: Emergency Medicine | Admitting: Emergency Medicine

## 2016-05-10 DIAGNOSIS — W01198A Fall on same level from slipping, tripping and stumbling with subsequent striking against other object, initial encounter: Secondary | ICD-10-CM | POA: Insufficient documentation

## 2016-05-10 DIAGNOSIS — S42215A Unspecified nondisplaced fracture of surgical neck of left humerus, initial encounter for closed fracture: Secondary | ICD-10-CM | POA: Insufficient documentation

## 2016-05-10 DIAGNOSIS — Y92031 Bathroom in apartment as the place of occurrence of the external cause: Secondary | ICD-10-CM | POA: Diagnosis not present

## 2016-05-10 DIAGNOSIS — S42212A Unspecified displaced fracture of surgical neck of left humerus, initial encounter for closed fracture: Secondary | ICD-10-CM | POA: Diagnosis not present

## 2016-05-10 DIAGNOSIS — Y999 Unspecified external cause status: Secondary | ICD-10-CM | POA: Insufficient documentation

## 2016-05-10 DIAGNOSIS — W19XXXA Unspecified fall, initial encounter: Secondary | ICD-10-CM

## 2016-05-10 DIAGNOSIS — Y92009 Unspecified place in unspecified non-institutional (private) residence as the place of occurrence of the external cause: Secondary | ICD-10-CM

## 2016-05-10 DIAGNOSIS — N183 Chronic kidney disease, stage 3 (moderate): Secondary | ICD-10-CM | POA: Insufficient documentation

## 2016-05-10 DIAGNOSIS — I129 Hypertensive chronic kidney disease with stage 1 through stage 4 chronic kidney disease, or unspecified chronic kidney disease: Secondary | ICD-10-CM | POA: Diagnosis not present

## 2016-05-10 DIAGNOSIS — Y939 Activity, unspecified: Secondary | ICD-10-CM | POA: Insufficient documentation

## 2016-05-10 DIAGNOSIS — S4992XA Unspecified injury of left shoulder and upper arm, initial encounter: Secondary | ICD-10-CM | POA: Diagnosis present

## 2016-05-10 MED ORDER — ONDANSETRON 4 MG PO TBDP: 4.0000 mg | ORAL_TABLET | Freq: Four times a day (QID) | ORAL | 0 refills | Status: DC | PRN

## 2016-05-10 MED ORDER — HYDROCODONE-ACETAMINOPHEN 5-325 MG PO TABS: 1.0000 | ORAL_TABLET | Freq: Four times a day (QID) | ORAL | 0 refills | Status: DC | PRN

## 2016-05-10 MED ORDER — HYDROCODONE-ACETAMINOPHEN 5-325 MG PO TABS
1.0000 | ORAL_TABLET | Freq: Once | ORAL | Status: AC
  Administered 2016-05-10: 1 via ORAL
  Filled 2016-05-10: qty 1

## 2016-05-10 MED ORDER — CYCLOBENZAPRINE HCL 5 MG PO TABS: 5.0000 mg | ORAL_TABLET | Freq: Three times a day (TID) | ORAL | 0 refills | Status: DC | PRN

## 2016-05-10 MED ORDER — ORPHENADRINE CITRATE 30 MG/ML IJ SOLN
60.0000 mg | INTRAMUSCULAR | Status: AC
  Administered 2016-05-10: 60 mg via INTRAMUSCULAR
  Filled 2016-05-10: qty 2

## 2016-05-10 MED ORDER — ONDANSETRON 4 MG PO TBDP
4.0000 mg | ORAL_TABLET | Freq: Once | ORAL | Status: AC
  Administered 2016-05-10: 4 mg via ORAL
  Filled 2016-05-10: qty 1

## 2016-05-10 MED ORDER — KETOROLAC TROMETHAMINE 60 MG/2ML IM SOLN
30.0000 mg | Freq: Once | INTRAMUSCULAR | Status: AC
  Administered 2016-05-10: 30 mg via INTRAMUSCULAR
  Filled 2016-05-10: qty 2

## 2016-05-10 NOTE — ED Notes (Signed)
Pt states hx of HTN, takes medicine for it. Denies any symptoms of HTN urgency at this time.

## 2016-05-10 NOTE — ED Notes (Signed)
Pt returned from xray via stretcher.

## 2016-05-10 NOTE — ED Notes (Signed)
Last night kitten got in way and she fell hitting left shoulder on marble floor.  Good circulation.

## 2016-05-10 NOTE — Discharge Instructions (Signed)
Take the prescription meds as directed. Apply ice to the shoulder for pain relief. Wear the shoulder immobilizer when out of bed. Remove it to shower and dress. Call Dr. Mack Guise tomorrow to schedule an appointment for further fracture care.

## 2016-05-10 NOTE — ED Triage Notes (Signed)
Patient states she tripped over a kitten last night and fell on a marble floor. Patient c/o left shoulder/upper arm pain.

## 2016-05-10 NOTE — ED Provider Notes (Signed)
[facility] Emergency Department Provider Note ____________________________________________  Time seen: 1405  I have reviewed the triage vital signs and the nursing notes.  HISTORY  Chief Complaint  Fall  HPI Amy [name] is a 66 y.o. female presents to the ED for evaluation ofleft shoulder and arm pain status post mechanical fall. Patient describes that she tripped over her kitten last night and fell on the marble floor in the bathroom. She describes hitting her left shoulder as she fell. She denies any head injury, loss of consciousness, or dizziness. She reports now with decreased ROM and strength. She has dosed IBU and Fioricet with limited benefit. She reports weakness to her LUE.  Past Medical History:  Diagnosis Date  . History of chicken pox   . History of measles as a child   . Hypertension     Patient Active Problem List   Diagnosis Date Noted  . Chronic kidney disease (CKD), active medical management without dialysis, stage 3 (moderate) 02/05/2016  . Chronic headache 12/11/2015  . Multiple thyroid nodules 07/04/2015  . Right thyroid nodule 06/27/2015  . Allergic rhinitis, seasonal 12/03/2014  . Fever blister 12/03/2014  . Herniated lumbar intervertebral disc 12/03/2014  . Hypokalemia 12/03/2014  . Recurrent sinus infections 12/03/2014  . Insomnia 11/05/2014  . Acquired scoliosis 06/26/2014  . Intervertebral disc stenosis of neural canal 06/20/2014  . Degenerative spondylolisthesis 03/12/2014  . OP (osteoporosis) 08/10/2013  . Idiopathic peripheral neuropathy 03/24/2009  . Edema 12/27/2007  . Headache, tension-type 10/25/2006  . Essential (primary) hypertension 10/24/2006  . Anxiety 05/03/1998  . Esophageal reflux 05/03/1998    Past Surgical History:  Procedure Laterality Date  . ABDOMINAL HYSTERECTOMY  2003   due to heavy periods and clotting during menses  . BREAST LUMPECTOMY Left 1990  . CHOLECYSTECTOMY  2010  . DILATION AND  CURETTAGE OF UTERUS  1985  . GALLBLADDER SURGERY  2012  . KNEE SURGERY     Dr. Tamala Julian  . MRI, Lumbar spine  07/23/2009   Multilevel annular bulge and disc protrusion at L2-L3, L3-L4, L4-L5, and L5-S1  . TONSILLECTOMY    . TONSILLECTOMY AND ADENOIDECTOMY  1970  . UPPER GI ENDOSCOPY  03/02/2006   H. Pylori negative; Dr. Allen Norris    Prior to Admission medications   Medication Sig Start Date End Date Taking? Authorizing Provider  alendronate (FOSAMAX) 70 MG tablet Take 1 tablet (70 mg total) by mouth every 7 (seven) days. Take with a full glass of water on an empty stomach. 12/11/15   [name], MD  amoxicillin-clavulanate (AUGMENTIN XR) 1000-62.5 MG 12 hr tablet Take 2 tablets by mouth 2 (two) times daily. 03/05/16   [name], MD  atenolol (TENORMIN) 50 MG tablet TAKE 1 TABLET BY MOUTH EVERY DAY 11/08/15   [name], MD  buPROPion [facility]) 75 MG tablet TAKE 2 TABLETS BY MOUTH DAILY 08/18/15   [name], MD  butalbital-acetaminophen-caffeine (FIORICET, ESGIC) [phone] MG tablet TAKE 1 TABLET BY MOUTH EVERY 6 HOURS AS NEEDED FOR PAIN, MAX 8 TABLETS DAILY 03/23/16   [name], MD  Cimetidine (TAGAMET PO) Take by mouth 3 times/day as needed-between meals & bedtime.    Historical Provider, MD  fluticasone (FLONASE) 50 MCG/ACT nasal spray Place 1-2 sprays into both nostrils daily. 12/04/14   [name], MD  furosemide (LASIX) 20 MG tablet TAKE 1 TABLET BY MOUTH EVERY DAY AS NEEDED FOR SWELLING 09/17/15   [name], MD  irbesartan-hydrochlorothiazide (  AVALIDE) 300-12.5 MG tablet Take 1 tablet by mouth daily. 02/05/16   [name], MD  lisinopril-hydrochlorothiazide (PRINZIDE,ZESTORETIC) 20-12.5 MG tablet TAKE 1 TABLET BY MOUTH DAILY 02/07/16   [name], MD  LORazepam (ATIVAN) 1 MG tablet TAKE 1 TABLET BY MOUTH TWICE A DAY AS NEEDED 03/17/16   [name], MD  omeprazole (PRILOSEC) 40 MG capsule Take 1 capsule (40 mg total) by mouth daily as needed  (heartburn). 12/23/15   [name], MD  omeprazole (PRILOSEC) 40 MG capsule Take 1 capsule by mouth daily. 01/17/14   Historical Provider, MD  valACYclovir (VALTREX) 1000 MG tablet 2 tablets twice a day for 1 day as needed for cold sores 06/24/15   [name], MD  zolpidem (AMBIEN) 10 MG tablet TAKE 1 TABLET BY MOUTH EVERY NIGHT AT BEDTIME AS NEEDED FOR SLEEP 05/05/16   [name], MD    Allergies Cephalosporins; Clarithromycin; Lunesta  [eszopiclone]; Penicillin v; and Sulfa antibiotics  Family History  Problem Relation Age of Onset  . Pancreatic cancer Father   . Congestive Heart Failure Maternal Grandmother   . Cancer Paternal Grandfather   . Heart disease Maternal Uncle     Social History Social History  Substance Use Topics  . Smoking status: Never Smoker  . Smokeless tobacco: Never Used  . Alcohol use 0.0 oz/week     Comment: occasional    Review of Systems  Constitutional: Negative for fever. Cardiovascular: Negative for chest pain. Respiratory: Negative for shortness of breath. Gastrointestinal: Negative for abdominal pain, vomiting and diarrhea. Musculoskeletal: Negative for back pain.Left upper extremity pain as above. Skin: Negative for rash. Neurological: Negative for headaches, focal weakness or numbness. ____________________________________________  PHYSICAL EXAM:  VITAL SIGNS: ED Triage Vitals  Enc Vitals Group     BP 05/10/16 1307 (!) 179/80     Pulse Rate 05/10/16 1307 69     Resp 05/10/16 1307 18     Temp 05/10/16 1307 98.3 F (36.8 C)     Temp Source 05/10/16 1307 Oral     SpO2 --      Weight 05/10/16 1310 115 lb (52.2 kg)     Height 05/10/16 1310 5\' 4"  (1.626 m)     Head Circumference --      Peak Flow --      Pain Score 05/10/16 1310 9     Pain Loc --      Pain Edu? --      Excl. in [place]? --    Constitutional: Alert and oriented. Well appearing and in no distress. Head: Normocephalic and atraumatic. Eyes: Conjunctivae are  normal. PERRL. Normal extraocular movements Cardiovascular: Normal distal pulses. Respiratory: Normal respiratory effort.  Musculoskeletal: No obvious deformity, dislocation, or sulcus sign to the LUE.Decreased active ROM with extension, rotation, and abduction to the left shoulder. Normal wrist and elbow ROM on the left. Normal composite fists bilaterally.  Nontender with normal range of motion in all other extremities.  Neurologic:  Normal gait without ataxia. Normal speech and language. No gross focal neurologic deficits are appreciated. Skin:  Skin is warm, dry and intact. No rash noted. ____________________________________________   RADIOLOGY  Left Shoulder IMPRESSION: 1. Minimally displaced fracture, surgical neck of the humerus.  I, Yoltzin Ransom, Dannielle Karvonen, personally viewed and evaluated these images (plain radiographs) as part of my medical decision making, as well as reviewing the written report by the radiologist. ____________________________________________  PROCEDURES  Toradol 30 mg IM Norflex 60 mg IM Zofran  4 mg ODT Norco 5-325 mg PO shoulder immobilizer ____________________________________________  INITIAL IMPRESSION / ASSESSMENT AND PLAN / ED COURSE  Patient with initial fracture care for a closed nondisplaced transverse fracture of the surgical neck of her left humerus. Injury occurred status post mechanical fall. Patient is splinted in the ED using an arm sling for comfort.   ----------------------------------------- 4:28 PM on 05/10/2016 -----------------------------------------  She will be referred to Dr. Mack Guise, he will see her in the office this week, for further fracture management.  Clinical Course    ____________________________________________  FINAL CLINICAL IMPRESSION(S) / ED DIAGNOSES  Final diagnoses:  Closed nondisplaced fracture of surgical neck of left humerus, unspecified fracture morphology, initial encounter      Melvenia Needles, PA-C 05/10/16 [place], MD 05/10/16 1734

## 2016-05-13 DIAGNOSIS — M25519 Pain in unspecified shoulder: Secondary | ICD-10-CM | POA: Insufficient documentation

## 2016-05-13 DIAGNOSIS — S42215A Unspecified nondisplaced fracture of surgical neck of left humerus, initial encounter for closed fracture: Secondary | ICD-10-CM | POA: Diagnosis not present

## 2016-05-26 ENCOUNTER — Other Ambulatory Visit: Payer: Self-pay | Admitting: Family Medicine

## 2016-05-26 DIAGNOSIS — J0101 Acute recurrent maxillary sinusitis: Secondary | ICD-10-CM

## 2016-05-31 ENCOUNTER — Other Ambulatory Visit: Payer: Self-pay | Admitting: Family Medicine

## 2016-05-31 NOTE — Telephone Encounter (Signed)
Pharmacy requesting refills. Thanks!  

## 2016-06-03 DIAGNOSIS — S42215D Unspecified nondisplaced fracture of surgical neck of left humerus, subsequent encounter for fracture with routine healing: Secondary | ICD-10-CM | POA: Diagnosis not present

## 2016-06-07 ENCOUNTER — Other Ambulatory Visit: Payer: Self-pay | Admitting: Family Medicine

## 2016-06-07 DIAGNOSIS — R51 Headache: Principal | ICD-10-CM

## 2016-06-07 DIAGNOSIS — R519 Headache, unspecified: Secondary | ICD-10-CM

## 2016-06-07 NOTE — Telephone Encounter (Signed)
Called in. Emily Drozdowski, CMA  

## 2016-06-08 ENCOUNTER — Other Ambulatory Visit: Payer: Self-pay | Admitting: Family Medicine

## 2016-06-08 DIAGNOSIS — J0101 Acute recurrent maxillary sinusitis: Secondary | ICD-10-CM

## 2016-06-22 DIAGNOSIS — M25512 Pain in left shoulder: Secondary | ICD-10-CM | POA: Diagnosis not present

## 2016-07-13 ENCOUNTER — Other Ambulatory Visit: Payer: Self-pay | Admitting: Family Medicine

## 2016-07-13 DIAGNOSIS — J0101 Acute recurrent maxillary sinusitis: Secondary | ICD-10-CM

## 2016-07-27 ENCOUNTER — Other Ambulatory Visit: Payer: Self-pay | Admitting: Family Medicine

## 2016-07-27 DIAGNOSIS — J0101 Acute recurrent maxillary sinusitis: Secondary | ICD-10-CM

## 2016-08-20 ENCOUNTER — Other Ambulatory Visit: Payer: Self-pay | Admitting: Family Medicine

## 2016-08-20 DIAGNOSIS — J0101 Acute recurrent maxillary sinusitis: Secondary | ICD-10-CM

## 2016-08-23 ENCOUNTER — Other Ambulatory Visit: Payer: Self-pay | Admitting: Family Medicine

## 2016-08-23 DIAGNOSIS — G4700 Insomnia, unspecified: Secondary | ICD-10-CM

## 2016-08-24 NOTE — Telephone Encounter (Signed)
Rx called in to pharmacy. 

## 2016-09-05 ENCOUNTER — Inpatient Hospital Stay
Admission: EM | Admit: 2016-09-05 | Discharge: 2016-09-09 | DRG: 392 | Disposition: A | Discharge status: Home / Self Care | Payer: Medicare Other | Attending: Internal Medicine | Admitting: Internal Medicine

## 2016-09-05 ENCOUNTER — Encounter: Payer: Self-pay | Admitting: Internal Medicine

## 2016-09-05 ENCOUNTER — Emergency Department: Payer: Medicare Other

## 2016-09-05 DIAGNOSIS — R109 Unspecified abdominal pain: Secondary | ICD-10-CM | POA: Diagnosis present

## 2016-09-05 DIAGNOSIS — K5730 Diverticulosis of large intestine without perforation or abscess without bleeding: Secondary | ICD-10-CM | POA: Diagnosis present

## 2016-09-05 DIAGNOSIS — F419 Anxiety disorder, unspecified: Secondary | ICD-10-CM | POA: Diagnosis not present

## 2016-09-05 DIAGNOSIS — I10 Essential (primary) hypertension: Secondary | ICD-10-CM | POA: Diagnosis not present

## 2016-09-05 DIAGNOSIS — D649 Anemia, unspecified: Secondary | ICD-10-CM | POA: Diagnosis not present

## 2016-09-05 DIAGNOSIS — D62 Acute posthemorrhagic anemia: Secondary | ICD-10-CM | POA: Diagnosis present

## 2016-09-05 DIAGNOSIS — K621 Rectal polyp: Secondary | ICD-10-CM

## 2016-09-05 DIAGNOSIS — Z800 Family history of malignant neoplasm of digestive organs: Secondary | ICD-10-CM

## 2016-09-05 DIAGNOSIS — D120 Benign neoplasm of cecum: Secondary | ICD-10-CM | POA: Diagnosis not present

## 2016-09-05 DIAGNOSIS — K2970 Gastritis, unspecified, without bleeding: Secondary | ICD-10-CM | POA: Diagnosis not present

## 2016-09-05 DIAGNOSIS — T502X5A Adverse effect of carbonic-anhydrase inhibitors, benzothiadiazides and other diuretics, initial encounter: Secondary | ICD-10-CM | POA: Diagnosis present

## 2016-09-05 DIAGNOSIS — N183 Chronic kidney disease, stage 3 (moderate): Secondary | ICD-10-CM | POA: Diagnosis present

## 2016-09-05 DIAGNOSIS — R079 Chest pain, unspecified: Secondary | ICD-10-CM

## 2016-09-05 DIAGNOSIS — G8929 Other chronic pain: Secondary | ICD-10-CM | POA: Diagnosis present

## 2016-09-05 DIAGNOSIS — I129 Hypertensive chronic kidney disease with stage 1 through stage 4 chronic kidney disease, or unspecified chronic kidney disease: Secondary | ICD-10-CM | POA: Diagnosis not present

## 2016-09-05 DIAGNOSIS — N185 Chronic kidney disease, stage 5: Secondary | ICD-10-CM | POA: Diagnosis present

## 2016-09-05 DIAGNOSIS — Z880 Allergy status to penicillin: Secondary | ICD-10-CM

## 2016-09-05 DIAGNOSIS — K921 Melena: Secondary | ICD-10-CM | POA: Diagnosis not present

## 2016-09-05 DIAGNOSIS — K219 Gastro-esophageal reflux disease without esophagitis: Secondary | ICD-10-CM | POA: Diagnosis present

## 2016-09-05 DIAGNOSIS — Z882 Allergy status to sulfonamides status: Secondary | ICD-10-CM

## 2016-09-05 DIAGNOSIS — E871 Hypo-osmolality and hyponatremia: Secondary | ICD-10-CM | POA: Diagnosis not present

## 2016-09-05 DIAGNOSIS — D509 Iron deficiency anemia, unspecified: Secondary | ICD-10-CM | POA: Diagnosis present

## 2016-09-05 DIAGNOSIS — R0789 Other chest pain: Secondary | ICD-10-CM | POA: Diagnosis present

## 2016-09-05 DIAGNOSIS — K922 Gastrointestinal hemorrhage, unspecified: Secondary | ICD-10-CM

## 2016-09-05 DIAGNOSIS — Z7983 Long term (current) use of bisphosphonates: Secondary | ICD-10-CM

## 2016-09-05 DIAGNOSIS — Z8711 Personal history of peptic ulcer disease: Secondary | ICD-10-CM

## 2016-09-05 DIAGNOSIS — Z8249 Family history of ischemic heart disease and other diseases of the circulatory system: Secondary | ICD-10-CM

## 2016-09-05 HISTORY — DX: Anxiety disorder, unspecified: F41.9

## 2016-09-05 LAB — CBC
HCT: 23.6 % — ABNORMAL LOW (ref 35.0–47.0)
HEMOGLOBIN: 8.2 g/dL — AB (ref 12.0–16.0)
MCH: 32.6 pg (ref 26.0–34.0)
MCHC: 34.7 g/dL (ref 32.0–36.0)
MCV: 94 fL (ref 80.0–100.0)
PLATELETS: 241 10*3/uL (ref 150–440)
RBC: 2.51 MIL/uL — ABNORMAL LOW (ref 3.80–5.20)
RDW: 13.3 % (ref 11.5–14.5)
WBC: 6.1 10*3/uL (ref 3.6–11.0)

## 2016-09-05 LAB — TYPE AND SCREEN
ABO/RH(D): O POS
Antibody Screen: NEGATIVE

## 2016-09-05 LAB — BASIC METABOLIC PANEL
Anion gap: 10 (ref 5–15)
BUN: 33 mg/dL — ABNORMAL HIGH (ref 6–20)
CALCIUM: 9.5 mg/dL (ref 8.9–10.3)
CHLORIDE: 96 mmol/L — AB (ref 101–111)
CO2: 20 mmol/L — AB (ref 22–32)
CREATININE: 1.61 mg/dL — AB (ref 0.44–1.00)
GFR calc Af Amer: 37 mL/min — ABNORMAL LOW (ref >60)
GFR calc non Af Amer: 32 mL/min — ABNORMAL LOW (ref >60)
GLUCOSE: 108 mg/dL — AB (ref 65–99)
Potassium: 4.1 mmol/L (ref 3.5–5.1)
Sodium: 126 mmol/L — ABNORMAL LOW (ref 135–145)

## 2016-09-05 LAB — TROPONIN I

## 2016-09-05 MED ORDER — GI COCKTAIL ~~LOC~~
30.0000 mL | Freq: Once | ORAL | Status: AC
  Administered 2016-09-05: 30 mL via ORAL
  Filled 2016-09-05: qty 30

## 2016-09-05 NOTE — ED Provider Notes (Signed)
[facility] Emergency Department Provider Note   ____________________________________________   First MD Initiated Contact with Patient 09/05/16 2000     (approximate)  I have reviewed the triage vital signs and the nursing notes.   HISTORY  Chief Complaint Chest Pain   HPI Amy [name] is a 66 y.o. female with a history of a gastric ulcer as well as hypertension who is presenting to the emergency department today with left lower chest pain over the past day. She says the pain is aching and a 8 out of 10. She says it radiates through to her back and she also has a frontal headache which was not relieved with Fioricet today. She denies seeing any blood in her stool. Says that she try to Prilosec earlier today without relief of the pain. Also with nausea as well as shortness of breath on exertion.   Past Medical History:  Diagnosis Date  . History of chicken pox   . History of measles as a child   . Hypertension     Patient Active Problem List   Diagnosis Date Noted  . Chronic kidney disease (CKD), active medical management without dialysis, stage 3 (moderate) 02/05/2016  . Chronic headache 12/11/2015  . Multiple thyroid nodules 07/04/2015  . Right thyroid nodule 06/27/2015  . Allergic rhinitis, seasonal 12/03/2014  . Fever blister 12/03/2014  . Herniated lumbar intervertebral disc 12/03/2014  . Hypokalemia 12/03/2014  . Recurrent sinus infections 12/03/2014  . Insomnia 11/05/2014  . Acquired scoliosis 06/26/2014  . Intervertebral disc stenosis of neural canal 06/20/2014  . Degenerative spondylolisthesis 03/12/2014  . OP (osteoporosis) 08/10/2013  . Idiopathic peripheral neuropathy 03/24/2009  . Edema 12/27/2007  . Headache, tension-type 10/25/2006  . Essential (primary) hypertension 10/24/2006  . Anxiety 05/03/1998  . Esophageal reflux 05/03/1998    Past Surgical History:  Procedure Laterality Date  . ABDOMINAL HYSTERECTOMY  2003   due  to heavy periods and clotting during menses  . BREAST LUMPECTOMY Left 1990  . CHOLECYSTECTOMY  2010  . DILATION AND CURETTAGE OF UTERUS  1985  . GALLBLADDER SURGERY  2012  . KNEE SURGERY     Dr. Tamala Julian  . MRI, Lumbar spine  07/23/2009   Multilevel annular bulge and disc protrusion at L2-L3, L3-L4, L4-L5, and L5-S1  . TONSILLECTOMY    . TONSILLECTOMY AND ADENOIDECTOMY  1970  . UPPER GI ENDOSCOPY  03/02/2006   H. Pylori negative; Dr. Allen Norris    Prior to Admission medications   Medication Sig Start Date End Date Taking? Authorizing Provider  alendronate (FOSAMAX) 70 MG tablet Take 1 tablet (70 mg total) by mouth every 7 (seven) days. Take with a full glass of water on an empty stomach. 12/11/15   [name], MD  amoxicillin-clavulanate (AUGMENTIN XR) 1000-62.5 MG 12 hr tablet Take 2 tablets by mouth 2 (two) times daily. 03/05/16   [name], MD  amoxicillin-clavulanate (AUGMENTIN XR) 1000-62.5 MG 12 hr tablet TAKE 2 TABLETS BY MOUTH TWICE DAILY 08/20/16   [name], MD  atenolol (TENORMIN) 50 MG tablet TAKE 1 TABLET BY MOUTH EVERY DAY 11/08/15   [name], MD  buPROPion [facility]) 75 MG tablet TAKE 2 TABLETS BY MOUTH DAILY 08/18/15   [name], MD  butalbital-acetaminophen-caffeine (FIORICET, ESGIC) [phone] MG tablet TAKE 1 TABLET BY MOUTH EVERY 6 HOURS AS NEEDED FOR PAIN, MAX 8 TABLETS DAILY 06/07/16   [name], MD  Cimetidine (TAGAMET PO) Take by mouth 3 times/day  as needed-between meals & bedtime.    [provider]  cyclobenzaprine (FLEXERIL) 5 MG tablet Take 1 tablet (5 mg total) by mouth 3 (three) times daily as needed for muscle spasms. 05/10/16   Menshew, Dannielle Karvonen, PA-C  fluticasone (FLONASE) 50 MCG/ACT nasal spray Place 1-2 sprays into both nostrils daily. 12/04/14   [name], MD  furosemide (LASIX) 20 MG tablet TAKE 1 TABLET BY MOUTH EVERY DAY AS NEEDED FOR SWELLING 09/17/15   [name], MD  HYDROcodone-acetaminophen (NORCO)  5-325 MG tablet Take 1 tablet by mouth every 6 (six) hours as needed. 05/10/16   Menshew, Dannielle Karvonen, PA-C  irbesartan-hydrochlorothiazide (AVALIDE) 300-12.5 MG tablet TAKE 1 TABLET BY MOUTH DAILY 05/31/16   [name], MD  lisinopril-hydrochlorothiazide (PRINZIDE,ZESTORETIC) 20-12.5 MG tablet TAKE 1 TABLET BY MOUTH DAILY 02/07/16   [name], MD  LORazepam (ATIVAN) 1 MG tablet TAKE 1 TABLET BY MOUTH TWICE A DAY AS NEEDED 03/17/16   [name], MD  omeprazole (PRILOSEC) 40 MG capsule Take 1 capsule (40 mg total) by mouth daily as needed (heartburn). 12/23/15   [name], MD  omeprazole (PRILOSEC) 40 MG capsule Take 1 capsule by mouth daily. 01/17/14   [provider]  ondansetron (ZOFRAN ODT) 4 MG disintegrating tablet Take 1 tablet (4 mg total) by mouth every 6 (six) hours as needed for nausea or vomiting. 05/10/16   Menshew, Dannielle Karvonen, PA-C  valACYclovir (VALTREX) 1000 MG tablet 2 tablets twice a day for 1 day as needed for cold sores 06/24/15   [name], MD  zolpidem (AMBIEN) 10 MG tablet TAKE 1 TABLET BY MOUTH EVERY DAY AT BEDTIME AS NEEDED FOR SLEEP 08/24/16   [name], MD    Allergies Cephalosporins; Clarithromycin; Lunesta  [eszopiclone]; Penicillin v; and Sulfa antibiotics  Family History  Problem Relation Age of Onset  . Pancreatic cancer Father   . Congestive Heart Failure Maternal Grandmother   . Cancer Paternal Grandfather   . Heart disease Maternal Uncle     Social History Social History  Substance Use Topics  . Smoking status: Never Smoker  . Smokeless tobacco: Never Used  . Alcohol use 0.0 oz/week     Comment: occasional    Review of Systems  Constitutional: No fever/chills Eyes: No visual changes. ENT: No sore throat. Cardiovascular: as above Respiratory: as above Gastrointestinal: No abdominal pain.   no vomiting.  No diarrhea.  No constipation. Genitourinary: Negative for dysuria. Musculoskeletal: Negative for  back pain. Skin: Negative for rash. Neurological: Negative for headaches, focal weakness or numbness.   ____________________________________________   PHYSICAL EXAM:  VITAL SIGNS: ED Triage Vitals  Enc Vitals Group     BP 09/05/16 1641 (!) 199/83     Pulse Rate 09/05/16 1641 83     Resp 09/05/16 1641 18     Temp 09/05/16 1641 98.2 F (36.8 C)     Temp Source 09/05/16 1641 Oral     SpO2 09/05/16 1641 100 %     Weight 09/05/16 1641 118 lb (53.5 kg)     Height 09/05/16 1641 5\' 4"  (1.626 m)     Head Circumference --      Peak Flow --      Pain Score 09/05/16 1655 8     Pain Loc --      Pain Edu? --      Excl. in [place]? --     Constitutional: Alert and oriented. Well  appearing and in no acute distress. Eyes: Conjunctivae are normal. PERRL. EOMI. Head: Atraumatic. Nose: No congestion/rhinnorhea. Mouth/Throat: Mucous membranes are moist.   Neck: No stridor.   Cardiovascular: Normal rate, regular rhythm. Grossly normal heart sounds.  Chest pain is not reproducible to palpation. Respiratory: Normal respiratory effort.  No retractions. Lungs CTAB. Gastrointestinal: Soft and nontender. No distention. Rectal exam with brown stool which is heme positive. Musculoskeletal: No lower extremity tenderness nor edema.  No joint effusions. Neurologic:  Normal speech and language. No gross focal neurologic deficits are appreciated.  Skin:  Skin is warm, dry and intact. No rash noted. Psychiatric: Mood and affect are normal. Speech and behavior are normal.  ____________________________________________   LABS (all labs ordered are listed, but only abnormal results are displayed)  Labs Reviewed  BASIC METABOLIC PANEL - Abnormal; Notable for the following:       Result Value   Sodium 126 (*)    Chloride 96 (*)    CO2 20 (*)    Glucose, Bld 108 (*)    BUN 33 (*)    Creatinine, Ser 1.61 (*)    GFR calc non Af Amer 32 (*)    GFR calc Af Amer 37 (*)    All other components within normal  limits  CBC - Abnormal; Notable for the following:    RBC 2.51 (*)    Hemoglobin 8.2 (*)    HCT 23.6 (*)    All other components within normal limits  TROPONIN I  TYPE AND SCREEN   ____________________________________________  EKG  ED ECG REPORT I, Doran Stabler, the attending physician, personally viewed and interpreted this ECG.   Date: 09/05/2016  EKG Time: 1639  Rate: 81  Rhythm: normal sinus rhythm  Axis: Normal  Intervals:none  ST&T Change: No ST segment elevation or depression. T-wave inversion in V2.  No significant change from EKG of 10/25/2001.  ____________________________________________  RADIOLOGY    DG Chest 2 View (Final result)  Result time 09/05/16 17:32:02  Final result by Margarette Canada, MD (09/05/16 17:32:02)           Narrative:   CLINICAL DATA: Chest pain.  EXAM: CHEST 2 VIEW  COMPARISON: None.  FINDINGS: Upper limits normal heart size noted.  There is no evidence of focal airspace disease, pulmonary edema, suspicious pulmonary nodule/mass, pleural effusion, or pneumothorax. No acute bony abnormalities are identified.  IMPRESSION: No active cardiopulmonary disease.   Electronically Signed By: Margarette Canada M.D. On: 09/05/2016 17:32            ____________________________________________   PROCEDURES  Procedure(s) performed:   Procedures  Critical Care performed:   ____________________________________________   INITIAL IMPRESSION / ASSESSMENT AND PLAN / ED COURSE  Pertinent labs & imaging results that were available during my care of the patient were reviewed by me and considered in my medical decision making (see chart for details).  ----------------------------------------- 9:37 PM on 09/05/2016 -----------------------------------------  Suspect possible bleeding ulcer causing the patient's anemia. No previous hemoglobin for comparison. Patient will be admitted to the hospital. Also with a low  sodium possibly caused by the patient's copious water drinking. Signed out to Dr. Jannifer Franklin of the medicine service.      ____________________________________________   FINAL CLINICAL IMPRESSION(S) / ED DIAGNOSES  GI bleeding. Symptomatic anemia. Chest pain.    NEW MEDICATIONS STARTED DURING THIS VISIT:  New Prescriptions   No medications on file     Note:  This document was prepared using Dragon voice  recognition software and may include unintentional dictation errors.    [name], MD 09/05/16 2138

## 2016-09-05 NOTE — ED Notes (Signed)
Admitting Provider at bedside. 

## 2016-09-05 NOTE — H&P (Signed)
[place] [facility] [place] NAME: Amy [name]    MR#:  [id number]  DATE OF BIRTH:  11-17-1950  DATE OF ADMISSION:  09/05/2016  PRIMARY CARE PHYSICIAN: [name], MD   REQUESTING/REFERRING PHYSICIAN: Clearnce Hasten, MD  CHIEF COMPLAINT:   Chief Complaint  Patient presents with  . Chest Pain    HISTORY OF PRESENT ILLNESS:  Amy [name]  is a 66 y.o. female who presents with Left upper quadrant/epigastric abdominal pain, radiating into her chest and to her back. Patient denies any other cardiac symptoms, states that the pain started between 24-48 hrs. ago, has been constant, is an aching type pain. She does have a remote history of peptic ulcer disease. She has a history of gastric reflux disease. Her hemoglobin is also found to be 8 in the ED today. She states she doesn't know the exact number, but had blood work done in her primary care physician's office about 6 months ago and was not told that there was any significant abnormality. Hospitalists were called for admission and further evaluation.  PAST MEDICAL HISTORY:   Past Medical History:  Diagnosis Date  . Anxiety   . GERD (gastroesophageal reflux disease)   . History of chicken pox   . History of measles as a child   . Hypertension     PAST SURGICAL HISTORY:   Past Surgical History:  Procedure Laterality Date  . ABDOMINAL HYSTERECTOMY  2003   due to heavy periods and clotting during menses  . BREAST LUMPECTOMY Left 1990  . CHOLECYSTECTOMY  2010  . DILATION AND CURETTAGE OF UTERUS  1985  . GALLBLADDER SURGERY  2012  . KNEE SURGERY     Dr. Tamala Julian  . MRI, Lumbar spine  07/23/2009   Multilevel annular bulge and disc protrusion at L2-L3, L3-L4, L4-L5, and L5-S1  . TONSILLECTOMY    . TONSILLECTOMY AND ADENOIDECTOMY  1970  . UPPER GI ENDOSCOPY  03/02/2006   H. Pylori negative; Dr. Allen Norris    SOCIAL HISTORY:   Social History  Substance Use Topics  . Smoking status: Never  Smoker  . Smokeless tobacco: Never Used  . Alcohol use 0.0 oz/week     Comment: occasional    FAMILY HISTORY:   Family History  Problem Relation Age of Onset  . Pancreatic cancer Father   . Congestive Heart Failure Maternal Grandmother   . Cancer Paternal Grandfather   . Heart disease Maternal Uncle     DRUG ALLERGIES:   Allergies  Allergen Reactions  . Cephalosporins   . Clarithromycin   . Lunesta  [Eszopiclone]     Heart racing  . Penicillin V     Has patient had a PCN reaction causing immediate rash, facial/tongue/throat swelling, SOB or lightheadedness with hypotension: Yes Has patient had a PCN reaction causing severe rash involving mucus membranes or skin necrosis: No Has patient had a PCN reaction that required hospitalization No Has patient had a PCN reaction occurring within the last 10 years: No If all of the above answers are "NO", then may proceed with Cephalosporin use.  Ignacia [facility]ley Antibiotics     MEDICATIONS AT HOME:   Prior to Admission medications   Medication Sig Start Date End Date Taking? Authorizing Provider  buPROPion (WELLBUTRIN) 75 MG tablet TAKE 2 TABLETS BY MOUTH DAILY 08/18/15  Yes Fisher, Kirstie Peri, MD  butalbital-acetaminophen-caffeine (FIORICET, ESGIC) 50-325-40 MG tablet TAKE 1 TABLET BY MOUTH EVERY 6 HOURS AS NEEDED FOR PAIN, MAX  8 TABLETS DAILY 06/07/16  Yes [name], MD  furosemide (LASIX) 20 MG tablet TAKE 1 TABLET BY MOUTH EVERY DAY AS NEEDED FOR SWELLING 09/17/15  Yes [name], MD  irbesartan-hydrochlorothiazide (AVALIDE) 300-12.5 MG tablet TAKE 1 TABLET BY MOUTH DAILY 05/31/16  Yes [name], MD  LORazepam (ATIVAN) 1 MG tablet Take 1 mg by mouth at bedtime.   Yes [provider]  ranitidine (ZANTAC) 150 MG tablet Take 150 mg by mouth 2 (two) times daily.   Yes [provider]  valACYclovir (VALTREX) 1000 MG tablet 2 tablets twice a day for 1 day as needed for cold sores 06/24/15  Yes [name], MD   zolpidem (AMBIEN) 10 MG tablet TAKE 1 TABLET BY MOUTH EVERY DAY AT BEDTIME AS NEEDED FOR SLEEP 08/24/16  Yes [name], MD  alendronate (FOSAMAX) 70 MG tablet Take 1 tablet (70 mg total) by mouth every 7 (seven) days. Take with a full glass of water on an empty stomach. Patient not taking: Reported on 09/05/2016 12/11/15   [name], MD  Cimetidine (TAGAMET PO) Take by mouth 3 times/day as needed-between meals & bedtime.    [provider]    REVIEW OF SYSTEMS:  Review of Systems  Constitutional: Negative for chills, fever, malaise/fatigue and weight loss.  HENT: Negative for ear pain, hearing loss and tinnitus.   Eyes: Negative for blurred vision, double vision, pain and redness.  Respiratory: Negative for cough, hemoptysis and shortness of breath.   Cardiovascular: Negative for chest pain, palpitations, orthopnea and leg swelling.  Gastrointestinal: Positive for abdominal pain. Negative for constipation, diarrhea, nausea and vomiting.  Genitourinary: Negative for dysuria, frequency and hematuria.  Musculoskeletal: Negative for back pain, joint pain and neck pain.  Skin:       No acne, rash, or lesions  Neurological: Negative for dizziness, tremors, focal weakness and weakness.  Endo/Heme/Allergies: Negative for polydipsia. Does not bruise/bleed easily.  Psychiatric/Behavioral: Negative for depression. The patient is not nervous/anxious and does not have insomnia.      VITAL SIGNS:   Vitals:   09/05/16 2012 09/05/16 2030 09/05/16 2100 09/05/16 2154  BP: (!) 213/88 (!) 197/85 (!) 183/133 (!) 185/88  Pulse: 78 75 77 77  Resp: 12 15 18 18   Temp:      TempSrc:      SpO2: 100% 100% 99% 100%  Weight:      Height:       Wt Readings from Last 3 Encounters:  09/05/16 53.5 kg (118 lb)  05/10/16 52.2 kg (115 lb)  02/05/16 50.8 kg (112 lb)    PHYSICAL EXAMINATION:  Physical Exam  Vitals reviewed. Constitutional: She is oriented to person, place, and time. She  appears well-developed and well-nourished. No distress.  HENT:  Head: Normocephalic and atraumatic.  Mouth/Throat: Oropharynx is clear and moist.  Eyes: Conjunctivae and EOM are normal. Pupils are equal, round, and reactive to light. No scleral icterus.  Neck: Normal range of motion. Neck supple. No JVD present. No thyromegaly present.  Cardiovascular: Normal rate, regular rhythm and intact distal pulses.  Exam reveals no gallop and no friction rub.   No murmur heard. Respiratory: Effort normal and breath sounds normal. No respiratory distress. She has no wheezes. She has no rales.  GI: Soft. Bowel sounds are normal. She exhibits no distension. There is tenderness.  Musculoskeletal: Normal range of motion. She exhibits no edema.  No arthritis, no gout  Lymphadenopathy:    She  has no cervical adenopathy.  Neurological: She is alert and oriented to person, place, and time. No cranial nerve deficit.  No dysarthria, no aphasia  Skin: Skin is warm and dry. No rash noted. No erythema.  Psychiatric: She has a normal mood and affect. Her behavior is normal. Judgment and thought content normal.    LABORATORY PANEL:   CBC  Recent Labs Lab 09/05/16 1648  WBC 6.1  HGB 8.2*  HCT 23.6*  PLT 241   ------------------------------------------------------------------------------------------------------------------  Chemistries   Recent Labs Lab 09/05/16 1648  NA 126*  K 4.1  CL 96*  CO2 20*  GLUCOSE 108*  BUN 33*  CREATININE 1.61*  CALCIUM 9.5   ------------------------------------------------------------------------------------------------------------------  Cardiac Enzymes  Recent Labs Lab 09/05/16 1648  TROPONINI <0.03   ------------------------------------------------------------------------------------------------------------------  RADIOLOGY:  Dg Chest 2 View  Result Date: 09/05/2016 CLINICAL DATA:  Chest pain. EXAM: CHEST  2 VIEW COMPARISON:  None. FINDINGS: Upper  limits normal heart size noted. There is no evidence of focal airspace disease, pulmonary edema, suspicious pulmonary nodule/mass, pleural effusion, or pneumothorax. No acute bony abnormalities are identified. IMPRESSION: No active cardiopulmonary disease. Electronically Signed   By: Margarette Canada M.D.   On: 09/05/2016 17:32    EKG:   Orders placed or performed during the hospital encounter of 09/05/16  . EKG 12-Lead  . EKG 12-Lead  . ED EKG within 10 minutes  . ED EKG within 10 minutes    IMPRESSION AND PLAN:  Principal Problem:   Abdominal pain - epigastric, left upper quadrant pain. In conjunction with her anemia, and guaiac positive stool test here in the ED, there is suspicion for peptic ulcer disease. We will place her on PPI and get a GI consult Active Problems:   Hyponatremia - asymptomatic, suspect this is due to her hydrochlorothiazide, we will discontinue this medicine, hydrate with IV fluids, and monitor for improvement   Anxiety - home dose anxiolytics   Essential (primary) hypertension - uncontrolled, continue home meds, prn antihypertensives up front to control BP to goal <160/100, patient will need additional antihypertensive medication   Anemia - possibly due to slow bleeding PUD disease, PPI and GI consult as above   Esophageal reflux - PPI and GI consult   Chronic kidney disease (CKD), active medical management without dialysis, stage 3 (moderate) - stable, monitor and avoid nephrotoxins  All the records are reviewed and case discussed with ED provider. Management plans discussed with the patient and/or family.  DVT PROPHYLAXIS: Mechanical only  GI PROPHYLAXIS: PPI  ADMISSION STATUS: Observation  CODE STATUS: Full Code Status History    This patient does not have a recorded code status. Please follow your organizational policy for patients in this situation.    Advance Directive Documentation     Most Recent Value  Type of Advance Directive  Healthcare Power of  Attorney, Living will  Pre-existing out of facility DNR order (yellow form or pink MOST form)  -  "MOST" Form in Place?  -      TOTAL TIME TAKING CARE OF THIS PATIENT: 40 minutes.   Willadeen Colantuono Curry 09/05/2016, 10:43 PM  [facility]  Office  [phone]  CC: Primary care physician; [name], MD  Note:  This document was prepared using Dragon voice recognition software and may include unintentional dictation errors.

## 2016-09-05 NOTE — ED Triage Notes (Signed)
Pt c/o chest pain that started last night radiating to the back, 8/10 pain and headache. Pt thought it was indigestion. Pt states she used to take Prilosec but was switched Ranitidine. Pt has a hx of bleeding ulcer.

## 2016-09-06 DIAGNOSIS — I10 Essential (primary) hypertension: Secondary | ICD-10-CM | POA: Diagnosis not present

## 2016-09-06 DIAGNOSIS — R1013 Epigastric pain: Secondary | ICD-10-CM

## 2016-09-06 DIAGNOSIS — K219 Gastro-esophageal reflux disease without esophagitis: Secondary | ICD-10-CM | POA: Diagnosis not present

## 2016-09-06 DIAGNOSIS — D649 Anemia, unspecified: Secondary | ICD-10-CM

## 2016-09-06 DIAGNOSIS — E871 Hypo-osmolality and hyponatremia: Secondary | ICD-10-CM | POA: Diagnosis not present

## 2016-09-06 DIAGNOSIS — R109 Unspecified abdominal pain: Secondary | ICD-10-CM | POA: Diagnosis not present

## 2016-09-06 LAB — BASIC METABOLIC PANEL
ANION GAP: 9 (ref 5–15)
BUN: 29 mg/dL — ABNORMAL HIGH (ref 6–20)
CHLORIDE: 96 mmol/L — AB (ref 101–111)
CO2: 20 mmol/L — AB (ref 22–32)
Calcium: 9.3 mg/dL (ref 8.9–10.3)
Creatinine, Ser: 1.51 mg/dL — ABNORMAL HIGH (ref 0.44–1.00)
GFR calc non Af Amer: 35 mL/min — ABNORMAL LOW (ref >60)
GFR, EST AFRICAN AMERICAN: 40 mL/min — AB (ref >60)
Glucose, Bld: 116 mg/dL — ABNORMAL HIGH (ref 65–99)
Potassium: 3.9 mmol/L (ref 3.5–5.1)
SODIUM: 125 mmol/L — AB (ref 135–145)

## 2016-09-06 LAB — TROPONIN I: Troponin I: <0.03 ng/mL (ref <0.03)

## 2016-09-06 LAB — CBC
HCT: 23.5 % — ABNORMAL LOW (ref 35.0–47.0)
HEMOGLOBIN: 8.2 g/dL — AB (ref 12.0–16.0)
MCH: 32.7 pg (ref 26.0–34.0)
MCHC: 35 g/dL (ref 32.0–36.0)
MCV: 93.6 fL (ref 80.0–100.0)
Platelets: 256 10*3/uL (ref 150–440)
RBC: 2.51 MIL/uL — AB (ref 3.80–5.20)
RDW: 13.3 % (ref 11.5–14.5)
WBC: 7.2 10*3/uL (ref 3.6–11.0)

## 2016-09-06 MED ORDER — LABETALOL HCL 5 MG/ML IV SOLN
10.0000 mg | INTRAVENOUS | Status: DC | PRN
  Administered 2016-09-06 (×2): 10 mg via INTRAVENOUS
  Filled 2016-09-06 (×2): qty 4

## 2016-09-06 MED ORDER — FAMOTIDINE 20 MG PO TABS
20.0000 mg | ORAL_TABLET | Freq: Two times a day (BID) | ORAL | Status: DC
  Administered 2016-09-06 (×2): 20 mg via ORAL
  Filled 2016-09-06 (×2): qty 1

## 2016-09-06 MED ORDER — BUPROPION HCL 75 MG PO TABS
150.0000 mg | ORAL_TABLET | Freq: Every day | ORAL | Status: DC
  Administered 2016-09-06 – 2016-09-09 (×4): 150 mg via ORAL
  Filled 2016-09-06 (×4): qty 2

## 2016-09-06 MED ORDER — TRAMADOL HCL 50 MG PO TABS
50.0000 mg | ORAL_TABLET | Freq: Four times a day (QID) | ORAL | Status: DC | PRN
  Administered 2016-09-06: 50 mg via ORAL
  Filled 2016-09-06 (×2): qty 1

## 2016-09-06 MED ORDER — PANTOPRAZOLE SODIUM 40 MG PO TBEC
40.0000 mg | DELAYED_RELEASE_TABLET | Freq: Two times a day (BID) | ORAL | Status: DC
  Administered 2016-09-06 – 2016-09-09 (×7): 40 mg via ORAL
  Filled 2016-09-06 (×6): qty 1

## 2016-09-06 MED ORDER — POLYETHYLENE GLYCOL 3350 17 GM/SCOOP PO POWD
1.0000 | Freq: Once | ORAL | Status: AC
  Administered 2016-09-06: 255 g via ORAL
  Filled 2016-09-06: qty 255

## 2016-09-06 MED ORDER — ONDANSETRON HCL 4 MG PO TABS: 4.0000 mg | ORAL_TABLET | Freq: Four times a day (QID) | ORAL | Status: DC | PRN

## 2016-09-06 MED ORDER — BUTALBITAL-APAP-CAFFEINE 50-325-40 MG PO TABS
1.0000 | ORAL_TABLET | Freq: Four times a day (QID) | ORAL | Status: DC | PRN
  Administered 2016-09-06 – 2016-09-07 (×4): 1 via ORAL
  Filled 2016-09-06 (×7): qty 1

## 2016-09-06 MED ORDER — PANTOPRAZOLE SODIUM 40 MG PO TBEC
40.0000 mg | DELAYED_RELEASE_TABLET | Freq: Every day | ORAL | Status: DC
  Filled 2016-09-06: qty 1

## 2016-09-06 MED ORDER — HYDRALAZINE HCL 20 MG/ML IJ SOLN
10.0000 mg | Freq: Four times a day (QID) | INTRAMUSCULAR | Status: DC | PRN
  Administered 2016-09-06 – 2016-09-08 (×3): 10 mg via INTRAVENOUS
  Filled 2016-09-06 (×3): qty 1

## 2016-09-06 MED ORDER — FAMOTIDINE 20 MG PO TABS
20.0000 mg | ORAL_TABLET | Freq: Every day | ORAL | Status: DC
  Administered 2016-09-07 – 2016-09-09 (×3): 20 mg via ORAL
  Filled 2016-09-06 (×3): qty 1

## 2016-09-06 MED ORDER — ACETAMINOPHEN 325 MG PO TABS: 650.0000 mg | ORAL_TABLET | Freq: Four times a day (QID) | ORAL | Status: DC | PRN

## 2016-09-06 MED ORDER — SODIUM CHLORIDE 0.9 % IV SOLN
INTRAVENOUS | Status: AC
  Administered 2016-09-06 (×2): via INTRAVENOUS

## 2016-09-06 MED ORDER — AMLODIPINE BESYLATE 5 MG PO TABS: 5.0000 mg | ORAL_TABLET | Freq: Every day | ORAL | Status: DC

## 2016-09-06 MED ORDER — ACETAMINOPHEN 650 MG RE SUPP: 650.0000 mg | Freq: Four times a day (QID) | RECTAL | Status: DC | PRN

## 2016-09-06 MED ORDER — HYDROCHLOROTHIAZIDE 25 MG PO TABS
25.0000 mg | ORAL_TABLET | Freq: Every day | ORAL | Status: DC
  Administered 2016-09-06: 25 mg via ORAL
  Filled 2016-09-06: qty 1

## 2016-09-06 MED ORDER — METOPROLOL TARTRATE 25 MG PO TABS
25.0000 mg | ORAL_TABLET | Freq: Two times a day (BID) | ORAL | Status: DC
  Administered 2016-09-06 – 2016-09-08 (×6): 25 mg via ORAL
  Filled 2016-09-06 (×6): qty 1

## 2016-09-06 MED ORDER — ONDANSETRON HCL 4 MG/2ML IJ SOLN
4.0000 mg | Freq: Four times a day (QID) | INTRAMUSCULAR | Status: DC | PRN
  Administered 2016-09-06 – 2016-09-07 (×2): 4 mg via INTRAVENOUS
  Filled 2016-09-06 (×2): qty 2

## 2016-09-06 MED ORDER — SODIUM CHLORIDE 0.9 % IV SOLN
INTRAVENOUS | Status: DC
  Administered 2016-09-07: 03:00:00 via INTRAVENOUS

## 2016-09-06 MED ORDER — OXYCODONE HCL 5 MG PO TABS: 5.0000 mg | ORAL_TABLET | ORAL | Status: DC | PRN

## 2016-09-06 MED ORDER — LORAZEPAM 1 MG PO TABS
1.0000 mg | ORAL_TABLET | Freq: Every day | ORAL | Status: DC
  Administered 2016-09-06 – 2016-09-08 (×4): 1 mg via ORAL
  Filled 2016-09-06 (×4): qty 1

## 2016-09-06 MED ORDER — IRBESARTAN 150 MG PO TABS
300.0000 mg | ORAL_TABLET | Freq: Every day | ORAL | Status: DC
  Administered 2016-09-06 – 2016-09-09 (×4): 300 mg via ORAL
  Filled 2016-09-06 (×4): qty 2

## 2016-09-06 NOTE — Progress Notes (Addendum)
[facility] [place] NAME: Amy [name]    MR#:  [id number]  DATE OF BIRTH:  Apr 27, 1951  SUBJECTIVE:   Came in with abdominal pain-epigastric. Denies any NSAID use REVIEW OF SYSTEMS:   Review of Systems  Constitutional: Negative for chills, fever and weight loss.  HENT: Negative for ear discharge, ear pain and nosebleeds.   Eyes: Negative for blurred vision, pain and discharge.  Respiratory: Negative for sputum production, shortness of breath, wheezing and stridor.   Cardiovascular: Negative for chest pain, palpitations, orthopnea and PND.  Gastrointestinal: Positive for abdominal pain and nausea. Negative for diarrhea and vomiting.  Genitourinary: Negative for frequency and urgency.  Musculoskeletal: Negative for back pain and joint pain.  Neurological: Positive for weakness. Negative for sensory change, speech change and focal weakness.  Psychiatric/Behavioral: Negative for depression and hallucinations. The patient is not nervous/anxious.    Tolerating Diet:clears Tolerating PT: ambulatory  DRUG ALLERGIES:   Allergies  Allergen Reactions  . Cephalosporins   . Clarithromycin   . Lunesta  [Eszopiclone]     Heart racing  . Penicillin V     Has patient had a PCN reaction causing immediate rash, facial/tongue/throat swelling, SOB or lightheadedness with hypotension: Yes Has patient had a PCN reaction causing severe rash involving mucus membranes or skin necrosis: No Has patient had a PCN reaction that required hospitalization No Has patient had a PCN reaction occurring within the last 10 years: No If all of the above answers are "NO", then may proceed with Cephalosporin use.  . Sulfa Antibiotics     VITALS:  Blood pressure (!) 191/72, pulse (!) 57, temperature 98.1 F (36.7 C), temperature source Oral, resp. rate 14, height 5\' 4"  (1.626 m), weight 48.7 kg (107 lb 4.8 oz), SpO2 100 %.  PHYSICAL EXAMINATION:   Physical  Exam  GENERAL:  66 y.o.-year-old patient lying in the bed with no acute distress.  EYES: Pupils equal, round, reactive to light and accommodation. No scleral icterus. Extraocular muscles intact.  HEENT: Head atraumatic, normocephalic. Oropharynx and nasopharynx clear.  NECK:  Supple, no jugular venous distention. No thyroid enlargement, no tenderness.  LUNGS: Normal breath sounds bilaterally, no wheezing, rales, rhonchi. No use of accessory muscles of respiration.  CARDIOVASCULAR: S1, S2 normal. No murmurs, rubs, or gallops.  ABDOMEN: Soft, nontender, nondistended. Bowel sounds present. No organomegaly or mass.  EXTREMITIES: No cyanosis, clubbing or edema b/l.    NEUROLOGIC: Cranial nerves II through XII are intact. No focal Motor or sensory deficits b/l.   PSYCHIATRIC:  patient is alert and oriented x 3.  SKIN: No obvious rash, lesion, or ulcer.   LABORATORY PANEL:  CBC  Recent Labs Lab 09/06/16 0038  WBC 7.2  HGB 8.2*  HCT 23.5*  PLT 256    Chemistries   Recent Labs Lab 09/06/16 0038  NA 125*  K 3.9  CL 96*  CO2 20*  GLUCOSE 116*  BUN 29*  CREATININE 1.51*  CALCIUM 9.3   Cardiac Enzymes  Recent Labs Lab 09/06/16 1218  TROPONINI <0.03   RADIOLOGY:  Dg Chest 2 View  Result Date: 09/05/2016 CLINICAL DATA:  Chest pain. EXAM: CHEST  2 VIEW COMPARISON:  None. FINDINGS: Upper limits normal heart size noted. There is no evidence of focal airspace disease, pulmonary edema, suspicious pulmonary nodule/mass, pleural effusion, or pneumothorax. No acute bony abnormalities are identified. IMPRESSION: No active cardiopulmonary disease. Electronically Signed   By: Margarette Canada M.D.   On:  09/05/2016 17:32   ASSESSMENT AND PLAN:  Amy [name]  is a 66 y.o. female who presents with Left upper quadrant/epigastric abdominal pain, radiating into her chest and to her back. Patient denies any other cardiac symptoms, states that the pain started between 24-48 hrs. ago, has been constant,  is an aching type pain. She does have a remote history of peptic ulcer disease. She has a history of gastric reflux disease. Her hemoglobin is also found to be 8 in the ED today  1. Abdominal pain - epigastric, left upper quadrant pain. In conjunction with her anemia, and guaiac positive stool test here in the ED, there is suspicion for peptic ulcer disease.  - on PPI  -GI consult with Dr. Allen Norris appreciated. Patient to get EGD and colonoscopy tomorrow -She denies any NSAID or aspirin -Denies melena  2.   Hyponatremia - asymptomatic, suspect this is due to her hydrochlorothiazide, we will discontinue this medicine, hydrate with IV fluids, and monitor for improvement  3.  Anxiety - home dose anxiolytics  4.   Essential (primary) hypertension - uncontrolled, continue home meds, prn antihypertensives up front to control BP to goal <160/100, patient will need additional antihypertensive medication -Added metoprolol 25 twice a day -When necessary hydralazine -Continue irbesartan -Discontinued hydrochlorothiazide due to low sodium  5.   Anemia - possibly due to slow bleeding PUD disease, PPI and GI consult as above -Patient presented with hemoglobin of 8. Her hemoglobin in 2015 was 12.3   6.   Esophageal reflux - PPI    7.  Chronic kidney disease (CKD), active medical management without dialysis, stage 3 (moderate) - stable, monitor and avoid nephrotoxins    Case discussed with Care Management/Social Worker. Management plans discussed with the patient, family and they are in agreement.  CODE STATUS: Full  DVT Prophylaxis: SCD  TOTAL TIME TAKING CARE OF THIS PATIENT: *30* minutes.  >50% time spent on counselling and coordination of care  POSSIBLE D/C IN one to 2* DAYS, DEPENDING ON CLINICAL CONDITION.  Note: This dictation was prepared with Dragon dictation along with smaller phrase technology. Any transcriptional errors that result from this process are unintentional.  Mcadoo Muzquiz  M.D on 09/06/2016 at 3:47 PM  Between 7am to 6pm - Pager - [phone]  After 6pm go to [email or web address] - password [email or web address] [facility]  Office  [phone]  CC: Primary care physician; [name], MD

## 2016-09-06 NOTE — Care Management Obs Status (Signed)
[place] [facility] NOTIFICATION   Patient Details  Name: Amy [name] MRN: [id number] Date of Birth: 01-20-1951   Medicare Observation Status Notification Given:  Yes    Beverly Sessions, RN 09/06/2016, 3:43 PM

## 2016-09-06 NOTE — Progress Notes (Signed)
Patient called RN to room. Patient vomited mirilax prep. Zofran given IV. Dr. Allen Norris notified. MD order to continue to drink rest of prep slowly as tolerated.

## 2016-09-06 NOTE — Consult Note (Signed)
Lucilla Lame, MD [facility]  [address] [place], [place] [address] Phone: [phone] Fax : [phone]  Consultation  Referring Provider:     Dr. Jannifer Franklin Primary Care Physician:  [name], MD Primary Gastroenterologist:  Althia Forts         Reason for Consultation:     Anemia  Date of Admission:  09/05/2016 Date of Consultation:  09/06/2016         HPI:   Amy [name] is a 66 y.o. female who was reported to have some epigastric and left set up her pain radiates to her chest. The patient reports that the symptoms started approximately 1-2 days before admission. The patient states that the pain was constant. The patient was also found to have a hemoglobin of 8.2 on admission after her hemoglobin 2 years ago was 12.3. The patient reports that she hasn't had a colonoscopy in many years. The patient denies any black stools or bloody stools. There is a report of the patient having some weight loss although she is not certain how much weight she has lost. The patient's family who is with her including her daughter and her husband report that she has had significant weight loss over the last few years. There is no family history of colon cancer colon polyps. The patient does not have any iron studies since being admitted. She was found to have a chronically elevated creatinine with her most recent creatinine being 1.51. The patient has been sent home with stool cards by her PCP but she reports that she has been throwing them away.  Past Medical History:  Diagnosis Date  . Anxiety   . GERD (gastroesophageal reflux disease)   . History of chicken pox   . History of measles as a child   . Hypertension     Past Surgical History:  Procedure Laterality Date  . ABDOMINAL HYSTERECTOMY  2003   due to heavy periods and clotting during menses  . BREAST LUMPECTOMY Left 1990  . CHOLECYSTECTOMY  2010  . DILATION AND CURETTAGE OF UTERUS  1985  . GALLBLADDER SURGERY  2012  . KNEE SURGERY      Dr. Tamala Julian  . MRI, Lumbar spine  07/23/2009   Multilevel annular bulge and disc protrusion at L2-L3, L3-L4, L4-L5, and L5-S1  . TONSILLECTOMY    . TONSILLECTOMY AND ADENOIDECTOMY  1970  . UPPER GI ENDOSCOPY  03/02/2006   H. Pylori negative; Dr. Allen Norris    Prior to Admission medications   Medication Sig Start Date End Date Taking? Authorizing Provider  buPROPion (WELLBUTRIN) 75 MG tablet TAKE 2 TABLETS BY MOUTH DAILY 08/18/15  Yes Fisher, Kirstie Peri, MD  butalbital-acetaminophen-caffeine (FIORICET, ESGIC) 50-325-40 MG tablet TAKE 1 TABLET BY MOUTH EVERY 6 HOURS AS NEEDED FOR PAIN, MAX 8 TABLETS DAILY 06/07/16  Yes [name], MD  furosemide (LASIX) 20 MG tablet TAKE 1 TABLET BY MOUTH EVERY DAY AS NEEDED FOR SWELLING 09/17/15  Yes [name], MD  irbesartan-hydrochlorothiazide (AVALIDE) 300-12.5 MG tablet TAKE 1 TABLET BY MOUTH DAILY 05/31/16  Yes [name], MD  LORazepam (ATIVAN) 1 MG tablet Take 1 mg by mouth at bedtime.   Yes [provider]  ranitidine (ZANTAC) 150 MG tablet Take 150 mg by mouth 2 (two) times daily.   Yes [provider]  valACYclovir (VALTREX) 1000 MG tablet 2 tablets twice a day for 1 day as needed for cold sores 06/24/15  Yes Fisher, Kirstie Peri, MD  zolpidem (AMBIEN) 10 MG tablet TAKE 1 TABLET BY MOUTH EVERY DAY AT BEDTIME AS NEEDED FOR SLEEP 08/24/16  Yes [name], MD  alendronate (FOSAMAX) 70 MG tablet Take 1 tablet (70 mg total) by mouth every 7 (seven) days. Take with a full glass of water on an empty stomach. Patient not taking: Reported on 09/05/2016 12/11/15   [name], MD  Cimetidine (TAGAMET PO) Take by mouth 3 times/day as needed-between meals & bedtime.    [provider]    Family History  Problem Relation Age of Onset  . Pancreatic cancer Father   . Congestive Heart Failure Maternal Grandmother   . Cancer Paternal Grandfather   . Heart disease Maternal Uncle      Social History  Substance Use Topics  .  Smoking status: Never Smoker  . Smokeless tobacco: Never Used  . Alcohol use 0.0 oz/week     Comment: occasional    Allergies as of 09/05/2016 - Review Complete 09/05/2016  Allergen Reaction Noted  . Cephalosporins  12/03/2014  . Clarithromycin  12/03/2014  . Lunesta  [eszopiclone]  12/03/2014  . Penicillin v  12/03/2014  . Sulfa antibiotics  12/03/2014    Review of Systems:    All systems reviewed and negative except where noted in HPI.   Physical Exam:  Vital signs in last 24 hours: Temp:  [98.2 F (36.8 C)-98.7 F (37.1 C)] 98.7 F (37.1 C) (05/07 0544) Pulse Rate:  [67-83] 76 (05/07 0637) Resp:  [10-20] 20 (05/07 0544) BP: (162-213)/(54-133) 162/77 (05/07 0637) SpO2:  [98 %-100 %] 100 % (05/07 0544) Weight:  [107 lb 4.8 oz (48.7 kg)-118 lb (53.5 kg)] 107 lb 4.8 oz (48.7 kg) (05/07 0032) Last BM Date: 09/05/16 General:   Pleasant, cooperative in NAD Head:  Normocephalic and atraumatic. Eyes:   No icterus.   Conjunctiva pink. PERRLA. Ears:  Normal auditory acuity. Neck:  Supple; no masses or thyroidomegaly Lungs: Respirations even and unlabored. Lungs clear to auscultation bilaterally.   No wheezes, crackles, or rhonchi.  Heart:  Regular rate and rhythm;  Without murmur, clicks, rubs or gallops Abdomen:  Soft, nondistended, nontender. Normal bowel sounds. No appreciable masses or hepatomegaly.  No rebound or guarding.  Rectal:  Not performed. Msk:  Symmetrical without gross deformities.    Extremities:  Without edema, cyanosis or clubbing. Neurologic:  Alert and oriented x3;  grossly normal neurologically. Skin:  Intact without significant lesions or rashes. Cervical Nodes:  No significant cervical adenopathy. Psych:  Alert and cooperative. Normal affect.  LAB RESULTS:  Recent Labs  09/05/16 1648 09/06/16 0038  WBC 6.1 7.2  HGB 8.2* 8.2*  HCT 23.6* 23.5*  PLT 241 256   BMET  Recent Labs  09/05/16 1648 09/06/16 0038  NA 126* 125*  K 4.1 3.9  CL 96*  96*  CO2 20* 20*  GLUCOSE 108* 116*  BUN 33* 29*  CREATININE 1.61* 1.51*  CALCIUM 9.5 9.3   LFT No results for input(s): PROT, ALBUMIN, AST, ALT, ALKPHOS, BILITOT, BILIDIR, IBILI in the last 72 hours. PT/INR No results for input(s): LABPROT, INR in the last 72 hours.  STUDIES: Dg Chest 2 View  Result Date: 09/05/2016 CLINICAL DATA:  Chest pain. EXAM: CHEST  2 VIEW COMPARISON:  None. FINDINGS: Upper limits normal heart size noted. There is no evidence of focal airspace disease, pulmonary edema, suspicious pulmonary nodule/mass, pleural effusion, or pneumothorax. No acute bony abnormalities are identified. IMPRESSION: No active cardiopulmonary disease. Electronically Signed  By: Margarette Canada M.D.   On: 09/05/2016 17:32      Impression / Plan:   Amy [name] is a 66 y.o. y/o female with Anemia who was admitted with chest pain. The patient's troponin has been negative. The patient's hemoglobin had been previously normal approximately 2 years ago. The patient will be set up for an EGD and colonoscopy for tomorrow. The patient will be prepped today. I have discussed risks & benefits which include, but are not limited to, bleeding, infection, perforation & drug reaction.  The patient agrees with this plan & written consent will be obtained.     Thank you for involving me in the care of this patient.      LOS: 0 days   Lucilla Lame, MD  09/06/2016, 12:52 PM   Note: This dictation was prepared with Dragon dictation along with smaller phrase technology. Any transcriptional errors that result from this process are unintentional.

## 2016-09-06 NOTE — Progress Notes (Signed)
Dr. Posey Pronto aware of patient BP 192/75. MD order to start metoprolol BID and hctz. Medications given and BP to be reassessed.

## 2016-09-06 NOTE — Progress Notes (Signed)
PHARMACY NOTE -   RENAL DOSE ADJUSTMENT   Patient has been initiated on famotidine 20mg  twice daily.    Scr: 1.51,  estimated CrCl 28 ml/min  Based on current renal function, current dosage/frequency  is not appropriate.   Pharmacy has adjusted the dose to Famotidine 20mg  Once daily.   Pernell Dupre, PharmD, BCPS Clinical Pharmacist 09/06/2016 10:23 AM

## 2016-09-07 ENCOUNTER — Observation Stay: Payer: Medicare Other | Admitting: Certified Registered Nurse Anesthetist

## 2016-09-07 ENCOUNTER — Encounter: Payer: Self-pay | Admitting: *Deleted

## 2016-09-07 ENCOUNTER — Other Ambulatory Visit: Payer: Self-pay

## 2016-09-07 ENCOUNTER — Encounter
Admission: EM | Disposition: A | Discharge status: Home / Self Care | Payer: Self-pay | Source: Home / Self Care | Attending: Internal Medicine

## 2016-09-07 DIAGNOSIS — Z860101 Personal history of adenomatous and serrated colon polyps: Secondary | ICD-10-CM | POA: Insufficient documentation

## 2016-09-07 DIAGNOSIS — R109 Unspecified abdominal pain: Secondary | ICD-10-CM | POA: Diagnosis not present

## 2016-09-07 DIAGNOSIS — Z86010 Personal history of colonic polyps: Secondary | ICD-10-CM | POA: Insufficient documentation

## 2016-09-07 DIAGNOSIS — E871 Hypo-osmolality and hyponatremia: Secondary | ICD-10-CM | POA: Diagnosis not present

## 2016-09-07 DIAGNOSIS — K2970 Gastritis, unspecified, without bleeding: Secondary | ICD-10-CM | POA: Diagnosis not present

## 2016-09-07 DIAGNOSIS — D120 Benign neoplasm of cecum: Secondary | ICD-10-CM

## 2016-09-07 DIAGNOSIS — K5790 Diverticulosis of intestine, part unspecified, without perforation or abscess without bleeding: Secondary | ICD-10-CM | POA: Diagnosis not present

## 2016-09-07 DIAGNOSIS — K621 Rectal polyp: Secondary | ICD-10-CM | POA: Diagnosis not present

## 2016-09-07 DIAGNOSIS — D649 Anemia, unspecified: Secondary | ICD-10-CM | POA: Diagnosis not present

## 2016-09-07 DIAGNOSIS — R51 Headache: Principal | ICD-10-CM

## 2016-09-07 DIAGNOSIS — I10 Essential (primary) hypertension: Secondary | ICD-10-CM | POA: Diagnosis not present

## 2016-09-07 DIAGNOSIS — K2960 Other gastritis without bleeding: Secondary | ICD-10-CM | POA: Diagnosis not present

## 2016-09-07 DIAGNOSIS — G8929 Other chronic pain: Secondary | ICD-10-CM

## 2016-09-07 DIAGNOSIS — R1013 Epigastric pain: Secondary | ICD-10-CM | POA: Diagnosis not present

## 2016-09-07 DIAGNOSIS — K635 Polyp of colon: Secondary | ICD-10-CM | POA: Diagnosis not present

## 2016-09-07 DIAGNOSIS — D128 Benign neoplasm of rectum: Secondary | ICD-10-CM | POA: Diagnosis not present

## 2016-09-07 HISTORY — PX: ESOPHAGOGASTRODUODENOSCOPY (EGD) WITH PROPOFOL: SHX5813

## 2016-09-07 HISTORY — PX: COLONOSCOPY WITH PROPOFOL: SHX5780

## 2016-09-07 LAB — BASIC METABOLIC PANEL
Anion gap: 6 (ref 5–15)
BUN: 22 mg/dL — AB (ref 6–20)
CALCIUM: 8.7 mg/dL — AB (ref 8.9–10.3)
CO2: 20 mmol/L — ABNORMAL LOW (ref 22–32)
CREATININE: 1.29 mg/dL — AB (ref 0.44–1.00)
Chloride: 97 mmol/L — ABNORMAL LOW (ref 101–111)
GFR, EST AFRICAN AMERICAN: 49 mL/min — AB (ref >60)
GFR, EST NON AFRICAN AMERICAN: 42 mL/min — AB (ref >60)
Glucose, Bld: 122 mg/dL — ABNORMAL HIGH (ref 65–99)
Potassium: 3.8 mmol/L (ref 3.5–5.1)
SODIUM: 123 mmol/L — AB (ref 135–145)

## 2016-09-07 SURGERY — ESOPHAGOGASTRODUODENOSCOPY (EGD) WITH PROPOFOL
Anesthesia: General

## 2016-09-07 MED ORDER — PROPOFOL 500 MG/50ML IV EMUL
INTRAVENOUS | Status: AC
  Filled 2016-09-07: qty 50

## 2016-09-07 MED ORDER — PROPOFOL 10 MG/ML IV BOLUS
INTRAVENOUS | Status: DC | PRN
  Administered 2016-09-07 (×2): 10 mg via INTRAVENOUS
  Administered 2016-09-07: 60 mg via INTRAVENOUS
  Administered 2016-09-07: 20 mg via INTRAVENOUS

## 2016-09-07 MED ORDER — LIDOCAINE HCL (CARDIAC) 20 MG/ML IV SOLN
INTRAVENOUS | Status: DC | PRN
  Administered 2016-09-07: 100 mg via INTRAVENOUS

## 2016-09-07 MED ORDER — LIDOCAINE HCL 2 % IJ SOLN
INTRAMUSCULAR | Status: AC
  Filled 2016-09-07: qty 10

## 2016-09-07 MED ORDER — BUTALBITAL-APAP-CAFFEINE 50-325-40 MG PO TABS: ORAL_TABLET | ORAL | 5 refills | Status: DC

## 2016-09-07 MED ORDER — PROPOFOL 500 MG/50ML IV EMUL
INTRAVENOUS | Status: DC | PRN
  Administered 2016-09-07: 140 ug/kg/min via INTRAVENOUS

## 2016-09-07 NOTE — Transfer of Care (Signed)
Immediate Anesthesia Transfer of Care Note  Patient: Amy [name]  Procedure(s) Performed: Procedure(s): ESOPHAGOGASTRODUODENOSCOPY (EGD) WITH PROPOFOL (N/A) COLONOSCOPY WITH PROPOFOL (N/A)  Patient Location: PACU  Anesthesia Type:General  Level of Consciousness: awake, alert  and oriented  Airway & Oxygen Therapy: Patient Spontanous Breathing and Patient connected to nasal cannula oxygen  Post-op Assessment: Report given to RN and Post -op Vital signs reviewed and stable  Post vital signs: Reviewed and stable  Last Vitals:  Vitals:   09/07/16 0508 09/07/16 0947  BP: (!) 142/65 (!) 169/64  Pulse: 72 72  Resp: 16 18  Temp: 36.9 C 36.1 C    Last Pain:  Vitals:   09/07/16 0947  TempSrc: Tympanic  PainSc:       Patients Stated Pain Goal: 1 (47/65/46 5035)  Complications: No apparent anesthesia complications

## 2016-09-07 NOTE — Anesthesia Postprocedure Evaluation (Signed)
Anesthesia Post Note  Patient: Amy [name]  Procedure(s) Performed: Procedure(s) (LRB): ESOPHAGOGASTRODUODENOSCOPY (EGD) WITH PROPOFOL (N/A) COLONOSCOPY WITH PROPOFOL (N/A)  Patient location during evaluation: Endoscopy Anesthesia Type: General Level of consciousness: awake and alert Pain management: pain level controlled Vital Signs Assessment: post-procedure vital signs reviewed and stable Respiratory status: spontaneous breathing, nonlabored ventilation, respiratory function stable and patient connected to nasal cannula oxygen Cardiovascular status: blood pressure returned to baseline and stable Postop Assessment: no signs of nausea or vomiting Anesthetic complications: no     Last Vitals:  Vitals:   09/07/16 1146 09/07/16 1200  BP: (!) 178/61 (!) 167/74  Pulse: 73 74  Resp: 14 16  Temp:  36.8 C    Last Pain:  Vitals:   09/07/16 1200  TempSrc: Oral  PainSc:                  Sabryna Lahm S

## 2016-09-07 NOTE — Progress Notes (Addendum)
[facility] [place] NAME: Olubunmi Rothenberger    MR#:  [id number]  DATE OF BIRTH:  12-May-1950  SUBJECTIVE:  CHIEF COMPLAINT:   Chief Complaint  Patient presents with  . Chest Pain   Fatigue. REVIEW OF SYSTEMS:  Review of Systems  Constitutional: Positive for malaise/fatigue. Negative for chills and fever.  HENT: Negative for congestion.   Eyes: Negative for blurred vision and double vision.  Respiratory: Negative for cough, shortness of breath, wheezing and stridor.   Cardiovascular: Negative for chest pain and leg swelling.  Gastrointestinal: Negative for abdominal pain, blood in stool, constipation, diarrhea, melena, nausea and vomiting.  Genitourinary: Negative for dysuria and frequency.  Musculoskeletal: Negative for back pain.  Neurological: Negative for dizziness, focal weakness, loss of consciousness and weakness.  Psychiatric/Behavioral: Negative for depression. The patient is not nervous/anxious.     DRUG ALLERGIES:   Allergies  Allergen Reactions  . Cephalosporins   . Clarithromycin   . Lunesta  [Eszopiclone]     Heart racing  . Penicillin V     Has patient had a PCN reaction causing immediate rash, facial/tongue/throat swelling, SOB or lightheadedness with hypotension: Yes Has patient had a PCN reaction causing severe rash involving mucus membranes or skin necrosis: No Has patient had a PCN reaction that required hospitalization No Has patient had a PCN reaction occurring within the last 10 years: No If all of the above answers are "NO", then may proceed with Cephalosporin use.  . Sulfa Antibiotics    VITALS:  Blood pressure (!) 167/74, pulse 74, temperature 98.2 F (36.8 C), temperature source Oral, resp. rate 16, height 5\' 4"  (1.626 m), weight 107 lb (48.5 kg), SpO2 100 %. PHYSICAL EXAMINATION:  Physical Exam  Constitutional: She is oriented to person, place, and time and well-developed, well-nourished, and in no  distress.  HENT:  Head: Normocephalic.  Mouth/Throat: Oropharynx is clear and moist.  Eyes: Conjunctivae and EOM are normal. No scleral icterus.  Neck: Normal range of motion. Neck supple. No JVD present. No tracheal deviation present.  Cardiovascular: Normal rate, regular rhythm and normal heart sounds.  Exam reveals no gallop.   No murmur heard. Pulmonary/Chest: Effort normal and breath sounds normal. No respiratory distress. She has no wheezes. She has no rales.  Abdominal: Soft. Bowel sounds are normal. She exhibits no distension. There is no tenderness. There is no rebound.  Musculoskeletal: Normal range of motion. She exhibits no tenderness.  Neurological: She is alert and oriented to person, place, and time. No cranial nerve deficit.  Skin: No rash noted. No erythema.  Psychiatric: Affect and judgment normal.   LABORATORY PANEL:  Female CBC  Recent Labs Lab 09/06/16 0038  WBC 7.2  HGB 8.2*  HCT 23.5*  PLT 256   ------------------------------------------------------------------------------------------------------------------ Chemistries   Recent Labs Lab 09/07/16 0520  NA 123*  K 3.8  CL 97*  CO2 20*  GLUCOSE 122*  BUN 22*  CREATININE 1.29*  CALCIUM 8.7*   RADIOLOGY:  No results found. ASSESSMENT AND PLAN:   Abdominal pain - epigastric, left upper quadrant pain. In conjunction with her anemia, and guaiac positive stool test here in the ED, there is suspicion for peptic ulcer disease.  EGD: gastritis, continue protonix. Colonoscopy: colon polyp. Biopsied.    Hyponatremia - asymptomatic, suspect this is due to her hydrochlorothiazide, worsening. Na 123. Discontinued HCTZ,  continue NS iv, f/u BMP.    Anemia due to acute blood loss. Hb is stable  at 8.2. Her hemoglobin in 2015 was 12.3     Esophageal reflux - PPI    Essential (primary) hypertension  Added metoprolol 25 twice a day -When necessary hydralazine -Continue irbesartan -Discontinued  hydrochlorothiazide due to low sodium    Chronic kidney disease (CKD), active medical management without dialysis, stage 3 (moderate) - stable, monitor and avoid nephrotoxins  Anxiety - home dose anxiolytics  All the records are reviewed and case discussed with Care Management/Social Worker. Management plans discussed with the patient, her husband and they are in agreement.  CODE STATUS: Full Code  TOTAL TIME TAKING CARE OF THIS PATIENT: 33 minutes.   More than 50% of the time was spent in counseling/coordination of care: YES  POSSIBLE D/C IN 1 DAYS, DEPENDING ON CLINICAL CONDITION.   Demetrios Loll M.D on 09/07/2016 at 5:12 PM  Between 7am to 6pm - Pager - [phone]  After 6pm go to [email or web address] - Proofreader  [facility]  Office  [phone]  CC: Primary care physician; [name], MD  Note: This dictation was prepared with Dragon dictation along with smaller phrase technology. Any transcriptional errors that result from this process are unintentional.

## 2016-09-07 NOTE — Anesthesia Post-op Follow-up Note (Cosign Needed)
Anesthesia QCDR form completed.        

## 2016-09-07 NOTE — Telephone Encounter (Signed)
Please review-aa 

## 2016-09-07 NOTE — Plan of Care (Signed)
Problem: Safety: Goal: Ability to remain free from injury will improve Outcome: Progressing Pt has a stable gait during my shift and has been removed from fall precautions.   Comments: Pt has gone to procedure and returned with no complications. She is pain free at this time.

## 2016-09-07 NOTE — Telephone Encounter (Signed)
Please call in Fioricet.  

## 2016-09-07 NOTE — Op Note (Addendum)
[facility] Gastroenterology Patient Name: Amy [name] Procedure Date: 09/07/2016 10:38 AM MRN: [id number] Account #: [id number] Date of Birth: 1950/06/17 Admit Type: Inpatient Age: 66 Room: [facility] ENDO ROOM 4 Gender: Female Note Status: Finalized Procedure:            Colonoscopy Indications:          Iron deficiency anemia Providers:            Lucilla Lame MD, MD Referring MD:         Kirstie Peri. Caryn Section, MD (Referring MD) Medicines:            Propofol per Anesthesia Complications:        No immediate complications. Procedure:            Pre-Anesthesia Assessment:                       - Prior to the procedure, a History and Physical was                        performed, and patient medications and allergies were                        reviewed. The patient's tolerance of previous                        anesthesia was also reviewed. The risks and benefits of                        the procedure and the sedation options and risks were                        discussed with the patient. All questions were                        answered, and informed consent was obtained. Prior                        Anticoagulants: The patient has taken no previous                        anticoagulant or antiplatelet agents. ASA Grade                        Assessment: II - A patient with mild systemic disease.                        After reviewing the risks and benefits, the patient was                        deemed in satisfactory condition to undergo the                        procedure.                       After obtaining informed consent, the colonoscope was                        passed under direct vision. Throughout the procedure,  the patient's blood pressure, pulse, and oxygen                        saturations were monitored continuously. The                        Colonoscope was introduced through the anus and                        advanced to  the the cecum, identified by appendiceal                        orifice and ileocecal valve. The colonoscopy was                        performed without difficulty. The patient tolerated the                        procedure well. The quality of the bowel preparation                        was good. Findings:      The perianal and digital rectal examinations were normal.      Two sessile polyps were found in the cecum. The polyps were 4 to 7 mm in       size. These polyps were removed with a cold snare. Resection and       retrieval were complete.      Multiple small-mouthed diverticula were found in the sigmoid colon and       descending colon.      A 4 mm polyp was found in the rectum. The polyp was removed with a cold       snare. Resection and retrieval were complete. Impression:           - Two 4 to 7 mm polyps in the cecum, removed with a                        cold snare. Resected and retrieved.                       - Diverticulosis in the sigmoid colon and in the                        descending colon.                       - One 4 mm polyp in the rectum, removed with a cold                        snare. Resected and retrieved. Recommendation:       - Return patient to hospital ward for ongoing care. Procedure Code(s):    --- Professional ---                       [phone], Colonoscopy, flexible; with removal of tumor(s),                        polyp(s), or other lesion(s) by snare technique Diagnosis Code(s):    --- Professional ---  D50.9, Iron deficiency anemia, unspecified                       D12.0, Benign neoplasm of cecum                       K62.1, Rectal polyp CPT copyright 2016 American Medical Association. All rights reserved. The codes documented in this report are preliminary and upon coder review may  be revised to meet current compliance requirements. Lucilla Lame MD, MD 09/07/2016 11:13:02 AM This report has been signed  electronically. Number of Addenda: 0 Note Initiated On: 09/07/2016 10:38 AM Scope Withdrawal Time: 0 hours 11 minutes 38 seconds  Total Procedure Duration: 0 hours 18 minutes 25 seconds       [facility]

## 2016-09-07 NOTE — Anesthesia Preprocedure Evaluation (Signed)
Anesthesia Evaluation  Patient identified by MRN, date of birth, ID band Patient awake    Reviewed: Allergy & Precautions, NPO status , Patient's Chart, lab work & pertinent test results, reviewed documented beta blocker date and time   Airway Mallampati: II  TM Distance: >3 FB     Dental  (+) Chipped   Pulmonary           Cardiovascular hypertension, Pt. on medications      Neuro/Psych  Headaches, Anxiety  Neuromuscular disease    GI/Hepatic GERD  ,  Endo/Other    Renal/GU Renal InsufficiencyRenal disease     Musculoskeletal   Abdominal   Peds  Hematology  (+) anemia ,   Anesthesia Other Findings   Reproductive/Obstetrics                             Anesthesia Physical Anesthesia Plan  ASA: II  Anesthesia Plan: General   Post-op Pain Management:    Induction: Intravenous  Airway Management Planned:   Additional Equipment:   Intra-op Plan:   Post-operative Plan:   Informed Consent: I have reviewed the patients History and Physical, chart, labs and discussed the procedure including the risks, benefits and alternatives for the proposed anesthesia with the patient or authorized representative who has indicated his/her understanding and acceptance.     Plan Discussed with: CRNA  Anesthesia Plan Comments:         Anesthesia Quick Evaluation

## 2016-09-07 NOTE — Op Note (Signed)
[facility] Gastroenterology Patient Name: Amy [name] Procedure Date: 09/07/2016 10:39 AM MRN: [id number] Account #: [id number] Date of Birth: 1950-09-19 Admit Type: Inpatient Age: 66 Room: [facility] ENDO ROOM 4 Gender: Female Note Status: Finalized Procedure:            Upper GI endoscopy Indications:          Iron deficiency anemia, Weight loss Providers:            Lucilla Lame MD, MD Referring MD:         Kirstie Peri. Caryn Section, MD (Referring MD) Medicines:            Propofol per Anesthesia Complications:        No immediate complications. Procedure:            Pre-Anesthesia Assessment:                       - Prior to the procedure, a History and Physical was                        performed, and patient medications and allergies were                        reviewed. The patient's tolerance of previous                        anesthesia was also reviewed. The risks and benefits of                        the procedure and the sedation options and risks were                        discussed with the patient. All questions were                        answered, and informed consent was obtained. Prior                        Anticoagulants: The patient has taken no previous                        anticoagulant or antiplatelet agents. ASA Grade                        Assessment: II - A patient with mild systemic disease.                        After reviewing the risks and benefits, the patient was                        deemed in satisfactory condition to undergo the                        procedure.                       After obtaining informed consent, the endoscope was                        passed under direct vision. Throughout the procedure,  the patient's blood pressure, pulse, and oxygen                        saturations were monitored continuously. The Endoscope                        was introduced through the mouth, and advanced to the                      second part of duodenum. The upper GI endoscopy was                        accomplished without difficulty. The patient tolerated                        the procedure well. Findings:      The examined esophagus was normal.      Diffuse moderate inflammation characterized by erythema was found in the       entire examined stomach. Biopsies were taken with a cold forceps for       histology.      The examined duodenum was normal. Impression:           - Normal esophagus.                       - Gastritis. Biopsied.                       - Normal examined duodenum. Recommendation:       - Return patient to hospital ward for ongoing care. Procedure Code(s):    --- Professional ---                       [phone], Esophagogastroduodenoscopy, flexible, transoral;                        with biopsy, single or multiple Diagnosis Code(s):    --- Professional ---                       D50.9, Iron deficiency anemia, unspecified                       R63.4, Abnormal weight loss                       K29.70, Gastritis, unspecified, without bleeding CPT copyright 2016 American Medical Association. All rights reserved. The codes documented in this report are preliminary and upon coder review may  be revised to meet current compliance requirements. Lucilla Lame MD, MD 09/07/2016 10:49:32 AM This report has been signed electronically. Number of Addenda: 0 Note Initiated On: 09/07/2016 10:39 AM      [facility]

## 2016-09-07 NOTE — Telephone Encounter (Signed)
Pt called requesting refill of Fioricet. Pt spoke with [facility], who advised her she has no more refills. Last refill was 06/07/2016, with 5 refills. Pt was advised of last refill date with number of refills, but she states takes this medication daily for chronic migraines. She denies taking more medication than as prescribed. Please advise. Renaldo Fiddler, CMA

## 2016-09-08 ENCOUNTER — Encounter: Payer: Self-pay | Admitting: Gastroenterology

## 2016-09-08 DIAGNOSIS — E871 Hypo-osmolality and hyponatremia: Secondary | ICD-10-CM | POA: Diagnosis present

## 2016-09-08 DIAGNOSIS — I129 Hypertensive chronic kidney disease with stage 1 through stage 4 chronic kidney disease, or unspecified chronic kidney disease: Secondary | ICD-10-CM | POA: Diagnosis present

## 2016-09-08 DIAGNOSIS — I10 Essential (primary) hypertension: Secondary | ICD-10-CM | POA: Diagnosis not present

## 2016-09-08 DIAGNOSIS — F419 Anxiety disorder, unspecified: Secondary | ICD-10-CM | POA: Diagnosis present

## 2016-09-08 DIAGNOSIS — D62 Acute posthemorrhagic anemia: Secondary | ICD-10-CM | POA: Diagnosis present

## 2016-09-08 DIAGNOSIS — R0789 Other chest pain: Secondary | ICD-10-CM | POA: Diagnosis present

## 2016-09-08 DIAGNOSIS — T502X5A Adverse effect of carbonic-anhydrase inhibitors, benzothiadiazides and other diuretics, initial encounter: Secondary | ICD-10-CM | POA: Diagnosis present

## 2016-09-08 DIAGNOSIS — Z880 Allergy status to penicillin: Secondary | ICD-10-CM | POA: Diagnosis not present

## 2016-09-08 DIAGNOSIS — D120 Benign neoplasm of cecum: Secondary | ICD-10-CM | POA: Diagnosis present

## 2016-09-08 DIAGNOSIS — Z7983 Long term (current) use of bisphosphonates: Secondary | ICD-10-CM | POA: Diagnosis not present

## 2016-09-08 DIAGNOSIS — Z882 Allergy status to sulfonamides status: Secondary | ICD-10-CM | POA: Diagnosis not present

## 2016-09-08 DIAGNOSIS — K5730 Diverticulosis of large intestine without perforation or abscess without bleeding: Secondary | ICD-10-CM | POA: Diagnosis present

## 2016-09-08 DIAGNOSIS — K219 Gastro-esophageal reflux disease without esophagitis: Secondary | ICD-10-CM | POA: Diagnosis present

## 2016-09-08 DIAGNOSIS — Z8711 Personal history of peptic ulcer disease: Secondary | ICD-10-CM | POA: Diagnosis not present

## 2016-09-08 DIAGNOSIS — Z8249 Family history of ischemic heart disease and other diseases of the circulatory system: Secondary | ICD-10-CM | POA: Diagnosis not present

## 2016-09-08 DIAGNOSIS — K621 Rectal polyp: Secondary | ICD-10-CM | POA: Diagnosis present

## 2016-09-08 DIAGNOSIS — K2970 Gastritis, unspecified, without bleeding: Secondary | ICD-10-CM | POA: Diagnosis present

## 2016-09-08 DIAGNOSIS — N183 Chronic kidney disease, stage 3 (moderate): Secondary | ICD-10-CM | POA: Diagnosis present

## 2016-09-08 DIAGNOSIS — Z800 Family history of malignant neoplasm of digestive organs: Secondary | ICD-10-CM | POA: Diagnosis not present

## 2016-09-08 DIAGNOSIS — R079 Chest pain, unspecified: Secondary | ICD-10-CM | POA: Diagnosis not present

## 2016-09-08 LAB — BASIC METABOLIC PANEL
Anion gap: 5 (ref 5–15)
BUN: 24 mg/dL — AB (ref 6–20)
CHLORIDE: 97 mmol/L — AB (ref 101–111)
CO2: 22 mmol/L (ref 22–32)
Calcium: 8.3 mg/dL — ABNORMAL LOW (ref 8.9–10.3)
Creatinine, Ser: 1.46 mg/dL — ABNORMAL HIGH (ref 0.44–1.00)
GFR calc Af Amer: 42 mL/min — ABNORMAL LOW (ref >60)
GFR calc non Af Amer: 36 mL/min — ABNORMAL LOW (ref >60)
GLUCOSE: 106 mg/dL — AB (ref 65–99)
POTASSIUM: 3.9 mmol/L (ref 3.5–5.1)
Sodium: 124 mmol/L — ABNORMAL LOW (ref 135–145)

## 2016-09-08 LAB — SURGICAL PATHOLOGY

## 2016-09-08 LAB — OSMOLALITY: Osmolality: 261 mOsm/kg — ABNORMAL LOW (ref 275–295)

## 2016-09-08 LAB — TSH: TSH: 1.267 u[IU]/mL (ref 0.350–4.500)

## 2016-09-08 MED ORDER — ZOLPIDEM TARTRATE 5 MG PO TABS
5.0000 mg | ORAL_TABLET | Freq: Every day | ORAL | Status: DC
  Filled 2016-09-08: qty 1

## 2016-09-08 MED ORDER — SODIUM CHLORIDE 1 G PO TABS
1.0000 g | ORAL_TABLET | Freq: Three times a day (TID) | ORAL | Status: DC
  Administered 2016-09-08 – 2016-09-09 (×4): 1 g via ORAL
  Filled 2016-09-08 (×6): qty 1

## 2016-09-08 MED ORDER — FUROSEMIDE 20 MG PO TABS
20.0000 mg | ORAL_TABLET | Freq: Every day | ORAL | Status: DC
  Administered 2016-09-08: 20 mg via ORAL
  Filled 2016-09-08: qty 1

## 2016-09-08 NOTE — Progress Notes (Signed)
Date: 09/08/2016                  Patient Name:  NEALIE MCHATTON  MRN: [id number]  DOB: 08/12/1950  Age / Sex: 66 y.o., female         PCP: [name], MD                 Service Requesting Consult: IM hospitalist/ Demetrios Loll, MD                 Reason for Consult: Hyponatremia            History of Present Illness: Patient is a 67 y.o. female with medical problems of Anxiety, Gastritis, HTN, CKD who was admitted to [facility] on 09/05/2016 for evaluation of epigastric pain, anemia.  Her sodium level was normal in 12/2015 at [address] was 1.50/GFR 36/41 suggesting CKD; TSH was normal at 1.350  Admit Na was 126; today's Na is 124 and not improved during hospitalization Nephrology consult to evaluate  CXR 5/6 no active Cardio Pulmonary disease No u/a or urine studies available Home meds - included Irbesartan- HCTZ Patient is not sure how long she has been taking it  She does report that she likes to drink a lot of water.  She can drink up to six 20 ounce bottles of water in addition to any other liquids of iced tea that she drinks.  She used to have some lower extremity edema when her diuretics were started.  No edema present    Medications: Outpatient medications: Prescriptions Prior to Admission  Medication Sig Dispense Refill Last Dose  . buPROPion (WELLBUTRIN) 75 MG tablet TAKE 2 TABLETS BY MOUTH DAILY 180 tablet 4 09/05/2016 at Unknown time  . furosemide (LASIX) 20 MG tablet TAKE 1 TABLET BY MOUTH EVERY DAY AS NEEDED FOR SWELLING 20 tablet 5 prn  . irbesartan-hydrochlorothiazide (AVALIDE) 300-12.5 MG tablet TAKE 1 TABLET BY MOUTH DAILY 30 tablet 3   . LORazepam (ATIVAN) 1 MG tablet Take 1 mg by mouth at bedtime.   09/04/2016 at Unknown time  . ranitidine (ZANTAC) 150 MG tablet Take 150 mg by mouth 2 (two) times daily.     . valACYclovir (VALTREX) 1000 MG tablet 2 tablets twice a day for 1 day as needed for cold sores 30 tablet 1 Taking  . zolpidem (AMBIEN) 10 MG tablet TAKE 1  TABLET BY MOUTH EVERY DAY AT BEDTIME AS NEEDED FOR SLEEP 30 tablet 5 09/04/2016 at Unknown time  . alendronate (FOSAMAX) 70 MG tablet Take 1 tablet (70 mg total) by mouth every 7 (seven) days. Take with a full glass of water on an empty stomach. (Patient not taking: Reported on 09/05/2016) 12 tablet 4 Not Taking at Unknown time  . Cimetidine (TAGAMET PO) Take by mouth 3 times/day as needed-between meals & bedtime.   Taking    Current medications: Current Facility-Administered Medications  Medication Dose Route Frequency Provider Last Rate Last Dose  . 0.9 %  sodium chloride infusion   Intravenous Continuous Fritzi Mandes, MD 75 mL/hr at 09/07/16 1949    . acetaminophen (TYLENOL) tablet 650 mg  650 mg Oral Q6H PRN Lance Coon, MD       Or  . acetaminophen (TYLENOL) suppository 650 mg  650 mg Rectal Q6H PRN Lance Coon, MD      . buPROPion [facility]) tablet 150 mg  150 mg Oral Daily Lance Coon, MD   150 mg at 09/08/16 1050  .  butalbital-acetaminophen-caffeine (FIORICET, ESGIC) 50-325-40 MG per tablet 1 tablet  1 tablet Oral Q6H PRN Harrie Foreman, MD   1 tablet at 09/07/16 1954  . famotidine (PEPCID) tablet 20 mg  20 mg Oral Daily Hallaji, Sheema M, RPH   20 mg at 09/08/16 1050  . hydrALAZINE (APRESOLINE) injection 10 mg  10 mg Intravenous Q6H PRN Fritzi Mandes, MD   10 mg at 09/08/16 0516  . irbesartan (AVAPRO) tablet 300 mg  300 mg Oral Daily Lance Coon, MD   300 mg at 09/08/16 1050  . LORazepam (ATIVAN) tablet 1 mg  1 mg Oral Corwin Levins, MD   1 mg at 09/07/16 2205  . metoprolol tartrate (LOPRESSOR) tablet 25 mg  25 mg Oral BID Fritzi Mandes, MD   25 mg at 09/08/16 1050  . ondansetron (ZOFRAN) tablet 4 mg  4 mg Oral Q6H PRN Lance Coon, MD       Or  . ondansetron [facility]) injection 4 mg  4 mg Intravenous Q6H PRN Lance Coon, MD   4 mg at 09/07/16 0029  . pantoprazole (PROTONIX) EC tablet 40 mg  40 mg Oral BID AC Fritzi Mandes, MD   40 mg at 09/08/16 0900  . sodium chloride  tablet 1 g  1 g Oral TID WC Demetrios Loll, MD   1 g at 09/08/16 0900  . traMADol (ULTRAM) tablet 50 mg  50 mg Oral Q6H PRN Lance Coon, MD   50 mg at 09/06/16 0204      Allergies: Allergies  Allergen Reactions  . Cephalosporins   . Clarithromycin   . Lunesta  [Eszopiclone]     Heart racing  . Penicillin V     Has patient had a PCN reaction causing immediate rash, facial/tongue/throat swelling, SOB or lightheadedness with hypotension: Yes Has patient had a PCN reaction causing severe rash involving mucus membranes or skin necrosis: No Has patient had a PCN reaction that required hospitalization No Has patient had a PCN reaction occurring within the last 10 years: No If all of the above answers are "NO", then may proceed with Cephalosporin use.  . Sulfa Antibiotics       Past Medical History: Past Medical History:  Diagnosis Date  . Anxiety   . GERD (gastroesophageal reflux disease)   . History of chicken pox   . History of measles as a child   . Hypertension      Past Surgical History: Past Surgical History:  Procedure Laterality Date  . ABDOMINAL HYSTERECTOMY  2003   due to heavy periods and clotting during menses  . BREAST LUMPECTOMY Left 1990  . CHOLECYSTECTOMY  2010  . DILATION AND CURETTAGE OF UTERUS  1985  . GALLBLADDER SURGERY  2012  . KNEE SURGERY     Dr. Tamala Julian  . MRI, Lumbar spine  07/23/2009   Multilevel annular bulge and disc protrusion at L2-L3, L3-L4, L4-L5, and L5-S1  . TONSILLECTOMY    . TONSILLECTOMY AND ADENOIDECTOMY  1970  . UPPER GI ENDOSCOPY  03/02/2006   H. Pylori negative; Dr. Allen Norris     Family History: Family History  Problem Relation Age of Onset  . Pancreatic cancer Father   . Congestive Heart Failure Maternal Grandmother   . Cancer Paternal Grandfather   . Heart disease Maternal Uncle      Social History: Social History   Social History  . Marital status: Married    Spouse name: N/A  . Number of children: 1  . Years of  education: N/A   Occupational History  . Has Desk job     Employed   Social History Main Topics  . Smoking status: Never Smoker  . Smokeless tobacco: Never Used  . Alcohol use 0.0 oz/week     Comment: occasional  . Drug use: No  . Sexual activity: Yes   Other Topics Concern  . Not on file   Social History Narrative  . No narrative on file     Review of Systems: Gen: no fever toe chills, no weight changes HEENT: no vision or hearing problems CV: no shortness of breath or chest pain Resp: no cough or sputum production GI:No nausea or vomiting GU : no problems reported but voiding.  No history of kidney stones  MS: joint pains off and on.  Takes over-the-counter Tylenol Derm:  no complaints Psych:no complaints Heme: no complaints Neuro: chronic headaches Endocrine.  No complaints  Vital Signs: Blood pressure (!) 155/66, pulse 77, temperature 98.2 F (36.8 C), temperature source Oral, resp. rate 16, height 5\' 4"  (1.626 m), weight 48.5 kg (107 lb), SpO2 97 %.   Intake/Output Summary (Last 24 hours) at 09/08/16 1115 Last data filed at 09/08/16 1004  Gross per 24 hour  Intake             2043 ml  Output              500 ml  Net             1543 ml    Weight trends: Filed Weights   09/05/16 1641 09/06/16 0032 09/07/16 0947  Weight: 53.5 kg (118 lb) 48.7 kg (107 lb 4.8 oz) 48.5 kg (107 lb)    Physical Exam: General:  no acute distress, laying in the bed  HEENT Anicteric, moist oral mucous membranes,PERRL  Neck:  supple, no masses  Lungs: Normal breathing effort, clear to auscultation  Heart::  regular rate and rhythm, no rub or gallop  Abdomen: Soft, nontender, nondistended  Extremities:  no peripheral edema  Neurologic: Alert, oriented  Skin: No acute rashes             Lab results: Basic Metabolic Panel:  Recent Labs Lab 09/06/16 0038 09/07/16 0520 09/08/16 0417  NA 125* 123* 124*  K 3.9 3.8 3.9  CL 96* 97* 97*  CO2 20* 20* 22  GLUCOSE 116*  122* 106*  BUN 29* 22* 24*  CREATININE 1.51* 1.29* 1.46*  CALCIUM 9.3 8.7* 8.3*    Liver Function Tests: No results for input(s): AST, ALT, ALKPHOS, BILITOT, PROT, ALBUMIN in the last 168 hours. No results for input(s): LIPASE, AMYLASE in the last 168 hours. No results for input(s): AMMONIA in the last 168 hours.  CBC:  Recent Labs Lab 09/05/16 1648 09/06/16 0038  WBC 6.1 7.2  HGB 8.2* 8.2*  HCT 23.6* 23.5*  MCV 94.0 93.6  PLT 241 256    Cardiac Enzymes:  Recent Labs Lab 09/06/16 1218  TROPONINI <0.03    BNP: Invalid input(s): POCBNP  CBG: No results for input(s): GLUCAP in the last 168 hours.  Microbiology: No results found for this or any previous visit (from the past 720 hour(s)).   Coagulation Studies: No results for input(s): LABPROT, INR in the last 72 hours.  Urinalysis: No results for input(s): COLORURINE, LABSPEC, PHURINE, GLUCOSEU, HGBUR, BILIRUBINUR, KETONESUR, PROTEINUR, UROBILINOGEN, NITRITE, LEUKOCYTESUR in the last 72 hours.  Invalid input(s): APPERANCEUR      Imaging:  No results found.   Assessment &  Plan: Pt is a 66 y.o.   female with Anxiety, Gastritis, HTN, CKD who was admitted to [facility] on 09/05/2016 for evaluation of epigastric pain, anemia.   1. Hyponatremia - patient had normal sodium in August of 2017.  She does not recall starting any new medications except irbesartan/HCTZ last fall.  She is not exactly sure but she thinks it is new. Her thyroid function tests were normal last year. linically, no evidence of congestive heart failure.  Chest x-ray negative for lung mass. We will obtain serum and urine osmolalities, TSH, serum and urine protein electrophoresis - fluid restriction to 1.5 L per day - Low-dose Lasix daily - Discontinue hydrochlorothiazide  2. CKD - 3. - chronic kidney disease is likely secondary to hypertension - serum creatinine appears to be at baseline 1.46/GFR 36  3. HTN - Current regimen includes  irbesartan, metoprolol

## 2016-09-08 NOTE — Progress Notes (Signed)
Dr. Ara Kussmaul notified of patient's request for Ambien as at home.  Acknowledged. New order written. Barbaraann Faster, RN 9:44 PM 09/08/2016

## 2016-09-08 NOTE — Progress Notes (Signed)
[place] [facility] [place] NAME: Dariya Gainer    MR#:  [id number]  DATE OF BIRTH:  1950-11-24  SUBJECTIVE:  CHIEF COMPLAINT:   Chief Complaint  Patient presents with  . Chest Pain   Headache. REVIEW OF SYSTEMS:  Review of Systems  Constitutional: Negative for chills, fever and malaise/fatigue.  HENT: Negative for congestion.   Eyes: Negative for blurred vision and double vision.  Respiratory: Negative for cough, shortness of breath, wheezing and stridor.   Cardiovascular: Negative for chest pain and leg swelling.  Gastrointestinal: Negative for abdominal pain, blood in stool, constipation, diarrhea, melena, nausea and vomiting.  Genitourinary: Negative for dysuria and frequency.  Musculoskeletal: Negative for back pain.  Neurological: Positive for headaches. Negative for dizziness, focal weakness, loss of consciousness and weakness.  Psychiatric/Behavioral: Negative for depression. The patient is not nervous/anxious.     DRUG ALLERGIES:   Allergies  Allergen Reactions  . Cephalosporins   . Clarithromycin   . Lunesta  [Eszopiclone]     Heart racing  . Penicillin V     Has patient had a PCN reaction causing immediate rash, facial/tongue/throat swelling, SOB or lightheadedness with hypotension: Yes Has patient had a PCN reaction causing severe rash involving mucus membranes or skin necrosis: No Has patient had a PCN reaction that required hospitalization No Has patient had a PCN reaction occurring within the last 10 years: No If all of the above answers are "NO", then may proceed with Cephalosporin use.  . Sulfa Antibiotics    VITALS:  Blood pressure (!) 144/58, pulse 72, temperature 98.9 F (37.2 C), temperature source Oral, resp. rate 20, height 5\' 4"  (1.626 m), weight 107 lb (48.5 kg), SpO2 97 %. PHYSICAL EXAMINATION:  Physical Exam  Constitutional: She is oriented to person, place, and time and well-developed, well-nourished, and  in no distress.  HENT:  Head: Normocephalic.  Mouth/Throat: Oropharynx is clear and moist.  Eyes: Conjunctivae and EOM are normal. No scleral icterus.  Neck: Normal range of motion. Neck supple. No JVD present. No tracheal deviation present.  Cardiovascular: Normal rate, regular rhythm and normal heart sounds.  Exam reveals no gallop.   No murmur heard. Pulmonary/Chest: Effort normal and breath sounds normal. No respiratory distress. She has no wheezes. She has no rales.  Abdominal: Soft. Bowel sounds are normal. She exhibits no distension. There is no tenderness. There is no rebound.  Musculoskeletal: Normal range of motion. She exhibits no tenderness.  Neurological: She is alert and oriented to person, place, and time. No cranial nerve deficit.  Skin: No rash noted. No erythema.  Psychiatric: Affect and judgment normal.   LABORATORY PANEL:  Female CBC  Recent Labs Lab 09/06/16 0038  WBC 7.2  HGB 8.2*  HCT 23.5*  PLT 256   ------------------------------------------------------------------------------------------------------------------ Chemistries   Recent Labs Lab 09/08/16 0417  NA 124*  K 3.9  CL 97*  CO2 22  GLUCOSE 106*  BUN 24*  CREATININE 1.46*  CALCIUM 8.3*   RADIOLOGY:  No results found. ASSESSMENT AND PLAN:   Abdominal pain - epigastric, left upper quadrant pain. In conjunction with her anemia, and guaiac positive stool test here in the ED, there was suspicion for peptic ulcer disease.  EGD: gastritis, continue protonix. Colonoscopy: colon polyp. Biopsied.    Hyponatremia - asymptomatic, suspect this is due to her hydrochlorothiazide, worsening. Na 124. Discontinued HCTZ, discontinue lasix. Add salt tad tid, continue NS iv, f/u BMP.  Nephrology consult.  Anemia due  to acute blood loss. Hb is stable at 8.2. Her hemoglobin in 2015 was 12.3     Esophageal reflux - PPI    Essential (primary) hypertension  Added metoprolol 25 twice a day -When necessary  hydralazine -Continue irbesartan -Discontinued hydrochlorothiazide and lasix due to low sodium    Chronic kidney disease (CKD), active medical management without dialysis, stage 3 (moderate) - stable, monitor and avoid nephrotoxins  Anxiety - home dose anxiolytics  I discussed with Dr. Candiss Norse. All the records are reviewed and case discussed with Care Management/Social Worker. Management plans discussed with the patient, her husband and they are in agreement.  CODE STATUS: Full Code  TOTAL TIME TAKING CARE OF THIS PATIENT: 33 minutes.   More than 50% of the time was spent in counseling/coordination of care: YES  POSSIBLE D/C IN 1 DAYS, DEPENDING ON CLINICAL CONDITION.   Demetrios Loll M.D on 09/08/2016 at 1:53 PM  Between 7am to 6pm - Pager - [phone]  After 6pm go to [email or web address] - Proofreader  [facility]  Office  [phone]  CC: Primary care physician; [name], MD  Note: This dictation was prepared with Dragon dictation along with smaller phrase technology. Any transcriptional errors that result from this process are unintentional.

## 2016-09-08 NOTE — Progress Notes (Signed)
Nutrition Brief Note  Patient identified to be seen for low BMI.  Wt Readings from Last 15 Encounters:  09/07/16 107 lb (48.5 kg)  05/10/16 115 lb (52.2 kg)  02/05/16 112 lb (50.8 kg)  12/11/15 117 lb (53.1 kg)  10/28/15 119 lb 9.6 oz (54.3 kg)  10/13/15 115 lb (52.2 kg)  07/22/15 109 lb (49.4 kg)  01/21/15 116 lb (52.6 kg)  12/04/14 116 lb (52.6 kg)  09/26/14 113 lb (51.3 kg)   Spoke with patient at bedside. She reports her appetite is very good and was also good PTA. She reports there were a few meals here she didn't eat well, but it was only because she didn't like the food. Otherwise reports finishing 100% of meals. PTA reports eating 3 meals per day with occasional snacks between meals when hungry. Patient denies any weight loss. She reports her UBW is 114-115 lbs. She said the admission weight was taken on a bed scale and she thinks it is incorrect. Attempted to obtain bed scale weight, but it was not zeroed and reading 127 lbs, which is also not correct. Even if patient had lost 8 lbs (7% body weight), it would not be significant weight loss over 4 months.  Nutrition-Focused physical exam completed. Findings are no fat depletion, no muscle depletion, and no edema.   Patient does not meet criteria for malnutrition at this time.  Body mass index is 18.37 kg/m. Patient meets criteria for underweight based on current BMI. Per patient's reported current weight, she would not be underweight.  Current diet order is Regular, patient is consuming approximately 100% of meals at this time (except for 25% on meal she did not like). Labs and medications reviewed.   No nutrition interventions warranted at this time. Discussed with RN. If nutrition issues arise, please consult RD.   Willey Blade, MS, RD, LDN Pager: [phone] After Hours Pager: [phone]

## 2016-09-08 NOTE — Telephone Encounter (Signed)
Rx called in to pharmacy. 

## 2016-09-09 ENCOUNTER — Telehealth: Payer: Self-pay | Admitting: Family Medicine

## 2016-09-09 ENCOUNTER — Encounter: Payer: Self-pay | Admitting: Family Medicine

## 2016-09-09 LAB — BASIC METABOLIC PANEL
Anion gap: 4 — ABNORMAL LOW (ref 5–15)
BUN: 24 mg/dL — ABNORMAL HIGH (ref 6–20)
CALCIUM: 8.7 mg/dL — AB (ref 8.9–10.3)
CHLORIDE: 101 mmol/L (ref 101–111)
CO2: 22 mmol/L (ref 22–32)
CREATININE: 1.38 mg/dL — AB (ref 0.44–1.00)
GFR, EST AFRICAN AMERICAN: 45 mL/min — AB (ref >60)
GFR, EST NON AFRICAN AMERICAN: 39 mL/min — AB (ref >60)
Glucose, Bld: 99 mg/dL (ref 65–99)
Potassium: 3.8 mmol/L (ref 3.5–5.1)
SODIUM: 127 mmol/L — AB (ref 135–145)

## 2016-09-09 LAB — PROTEIN ELECTROPHORESIS, SERUM
A/G Ratio: 2.2 — ABNORMAL HIGH (ref 0.7–1.7)
Albumin ELP: 3.7 g/dL (ref 2.9–4.4)
Alpha-1-Globulin: 0.2 g/dL (ref 0.0–0.4)
Alpha-2-Globulin: 0.6 g/dL (ref 0.4–1.0)
Beta Globulin: 0.7 g/dL (ref 0.7–1.3)
GAMMA GLOBULIN: 0.3 g/dL — AB (ref 0.4–1.8)
Globulin, Total: 1.7 g/dL — ABNORMAL LOW (ref 2.2–3.9)
TOTAL PROTEIN ELP: 5.4 g/dL — AB (ref 6.0–8.5)

## 2016-09-09 LAB — SODIUM, URINE, RANDOM: Sodium, Ur: 46 mmol/L

## 2016-09-09 LAB — KAPPA/LAMBDA LIGHT CHAINS
KAPPA FREE LGHT CHN: 15.5 mg/L (ref 3.3–19.4)
KAPPA, LAMDA LIGHT CHAIN RATIO: 1.32 (ref 0.26–1.65)
LAMDA FREE LIGHT CHAINS: 11.7 mg/L (ref 5.7–26.3)

## 2016-09-09 LAB — CORTISOL: CORTISOL PLASMA: 14.1 ug/dL

## 2016-09-09 LAB — OSMOLALITY, URINE: Osmolality, Ur: 242 mOsm/kg — ABNORMAL LOW (ref 300–900)

## 2016-09-09 MED ORDER — PANTOPRAZOLE SODIUM 40 MG PO TBEC: 40.0000 mg | DELAYED_RELEASE_TABLET | Freq: Two times a day (BID) | ORAL | 0 refills | Status: DC

## 2016-09-09 MED ORDER — METOPROLOL TARTRATE 50 MG PO TABS: 50.0000 mg | ORAL_TABLET | Freq: Two times a day (BID) | ORAL | 2 refills | Status: DC

## 2016-09-09 MED ORDER — IRBESARTAN 300 MG PO TABS: 300.0000 mg | ORAL_TABLET | Freq: Every day | ORAL | 2 refills | Status: DC

## 2016-09-09 MED ORDER — METOPROLOL TARTRATE 50 MG PO TABS: 50.0000 mg | ORAL_TABLET | Freq: Two times a day (BID) | ORAL | Status: DC

## 2016-09-09 NOTE — Discharge Summary (Signed)
[facility] [place] NAME: Amy [name]    MR#:  [id number]  DATE OF BIRTH:  10/26/1950  DATE OF ADMISSION:  09/05/2016   ADMITTING PHYSICIAN: Lance Coon, MD  DATE OF DISCHARGE: 09/09/2016  1:19 PM  PRIMARY CARE PHYSICIAN: [name], MD   ADMISSION DIAGNOSIS:  Nonspecific chest pain [R07.9] Gastrointestinal hemorrhage, unspecified gastrointestinal hemorrhage type [K92.2] Anemia, unspecified type [D64.9] DISCHARGE DIAGNOSIS:  Principal Problem:   Abdominal pain Active Problems:   Anxiety   Esophageal reflux   Essential (primary) hypertension   Chronic kidney disease (CKD), active medical management without dialysis, stage 3 (moderate)   Anemia   Hyponatremia   Benign neoplasm of cecum   Rectal polyp  SECONDARY DIAGNOSIS:   Past Medical History:  Diagnosis Date  . Anxiety   . GERD (gastroesophageal reflux disease)   . History of chicken pox   . History of measles as a child   . Hypertension    HOSPITAL COURSE:   Abdominal pain - epigastric, left upper quadrant pain. In conjunction with her anemia, and guaiac positive stool test here in the ED, there was suspicion for peptic ulcer disease.  EGD: gastritis, continue protonix Colonoscopy: colon polyp. Biopsied. Follow-up GI as outpatient.  Hyponatremia - asymptomatic, suspect this is due to her hydrochlorothiazide, Sodium improving to 127. Discontinued HCTZ, discontinue lasix. fluid restriction to 1.5 L per day. Treated with salt tad tid and NS iv, f/u BMP with Dr. Candiss Norse as outpatient.  Anemia due to acute blood loss. Hb is stable at 8.2. Her hemoglobin in 2015 was 12.3   Esophageal reflux - PPI  Essential (primary) hypertension  Added metoprolol 25 twice a day -When necessary hydralazine -Continue irbesartan -Discontinued hydrochlorothiazide and lasix due to low sodium  Chronic kidney disease (CKD), active medical management without dialysis,  stage 3 (moderate) - stable, monitor and avoid nephrotoxins  Anxiety - home dose anxiolytics DISCHARGE CONDITIONS:  Stable, discharge to home today. CONSULTS OBTAINED:  Treatment Team:  Lucilla Lame, MD Murlean Iba, MD DRUG ALLERGIES:   Allergies  Allergen Reactions  . Cephalosporins   . Clarithromycin   . Lunesta  [Eszopiclone]     Heart racing  . Penicillin V     Has patient had a PCN reaction causing immediate rash, facial/tongue/throat swelling, SOB or lightheadedness with hypotension: Yes Has patient had a PCN reaction causing severe rash involving mucus membranes or skin necrosis: No Has patient had a PCN reaction that required hospitalization No Has patient had a PCN reaction occurring within the last 10 years: No If all of the above answers are "NO", then may proceed with Cephalosporin use.  . Sulfa Antibiotics    DISCHARGE MEDICATIONS:   Allergies as of 09/09/2016      Reactions   Cephalosporins    Clarithromycin    Lunesta  [eszopiclone]    Heart racing   Penicillin V    Has patient had a PCN reaction causing immediate rash, facial/tongue/throat swelling, SOB or lightheadedness with hypotension: Yes Has patient had a PCN reaction causing severe rash involving mucus membranes or skin necrosis: No Has patient had a PCN reaction that required hospitalization No Has patient had a PCN reaction occurring within the last 10 years: No If all of the above answers are "NO", then may proceed with Cephalosporin use.   Sulfa Antibiotics       Medication List    STOP taking these medications   furosemide 20 MG tablet  Commonly known as:  LASIX   irbesartan-hydrochlorothiazide 300-12.5 MG tablet Commonly known as:  AVALIDE Replaced by:  irbesartan 300 MG tablet     TAKE these medications   alendronate 70 MG tablet Commonly known as:  FOSAMAX Take 1 tablet (70 mg total) by mouth every 7 (seven) days. Take with a full glass of water on an empty stomach.     buPROPion 75 MG tablet Commonly known as:  WELLBUTRIN TAKE 2 TABLETS BY MOUTH DAILY   butalbital-acetaminophen-caffeine 50-325-40 MG tablet Commonly known as:  FIORICET, ESGIC TAKE 1 TABLET BY MOUTH EVERY 6 HOURS AS NEEDED FOR PAIN, MAX 8 TABLETS DAILY   irbesartan 300 MG tablet Commonly known as:  AVAPRO Take 1 tablet (300 mg total) by mouth daily. Replaces:  irbesartan-hydrochlorothiazide 300-12.5 MG tablet   LORazepam 1 MG tablet Commonly known as:  ATIVAN Take 1 mg by mouth at bedtime.   metoprolol 50 MG tablet Commonly known as:  LOPRESSOR Take 1 tablet (50 mg total) by mouth 2 (two) times daily.   pantoprazole 40 MG tablet Commonly known as:  PROTONIX Take 1 tablet (40 mg total) by mouth 2 (two) times daily before a meal.   ranitidine 150 MG tablet Commonly known as:  ZANTAC Take 150 mg by mouth 2 (two) times daily.   TAGAMET PO Take by mouth 3 times/day as needed-between meals & bedtime.   valACYclovir 1000 MG tablet Commonly known as:  VALTREX 2 tablets twice a day for 1 day as needed for cold sores   zolpidem 10 MG tablet Commonly known as:  AMBIEN TAKE 1 TABLET BY MOUTH EVERY DAY AT BEDTIME AS NEEDED FOR SLEEP        DISCHARGE INSTRUCTIONS:  See AVS.  If you experience worsening of your admission symptoms, develop shortness of breath, life threatening emergency, suicidal or homicidal thoughts you must seek medical attention immediately by calling 911 or calling your MD immediately  if symptoms less severe.  You Must read complete instructions/literature along with all the possible adverse reactions/side effects for all the Medicines you take and that have been prescribed to you. Take any new Medicines after you have completely understood and accpet all the possible adverse reactions/side effects.   Please note  You were cared for by a hospitalist during your hospital stay. If you have any questions about your discharge medications or the care you  received while you were in the hospital after you are discharged, you can call the unit and asked to speak with the hospitalist on call if the hospitalist that took care of you is not available. Once you are discharged, your primary care physician will handle any further medical issues. Please note that NO REFILLS for any discharge medications will be authorized once you are discharged, as it is imperative that you return to your primary care physician (or establish a relationship with a primary care physician if you do not have one) for your aftercare needs so that they can reassess your need for medications and monitor your lab values.    On the day of Discharge:  VITAL SIGNS:  Blood pressure (!) 149/62, pulse 73, temperature 98 F (36.7 C), temperature source Oral, resp. rate 18, height 5\' 4"  (1.626 m), weight 107 lb (48.5 kg), SpO2 99 %. PHYSICAL EXAMINATION:  GENERAL:  66 y.o.-year-old patient lying in the bed with no acute distress.  EYES: Pupils equal, round, reactive to light and accommodation. No scleral icterus. Extraocular muscles intact.  HEENT: Head atraumatic,  normocephalic. Oropharynx and nasopharynx clear.  NECK:  Supple, no jugular venous distention. No thyroid enlargement, no tenderness.  LUNGS: Normal breath sounds bilaterally, no wheezing, rales,rhonchi or crepitation. No use of accessory muscles of respiration.  CARDIOVASCULAR: S1, S2 normal. No murmurs, rubs, or gallops.  ABDOMEN: Soft, non-tender, non-distended. Bowel sounds present. No organomegaly or mass.  EXTREMITIES: No pedal edema, cyanosis, or clubbing.  NEUROLOGIC: Cranial nerves II through XII are intact. Muscle strength 5/5 in all extremities. Sensation intact. Gait not checked.  PSYCHIATRIC: The patient is alert and oriented x 3.  SKIN: No obvious rash, lesion, or ulcer.  DATA REVIEW:   CBC  Recent Labs Lab 09/06/16 0038  WBC 7.2  HGB 8.2*  HCT 23.5*  PLT 256    Chemistries   Recent Labs Lab  09/09/16 0549  NA 127*  K 3.8  CL 101  CO2 22  GLUCOSE 99  BUN 24*  CREATININE 1.38*  CALCIUM 8.7*     Microbiology Results  No results found for this or any previous visit.  RADIOLOGY:  No results found.   Management plans discussed with the patient, family and they are in agreement.  CODE STATUS: Full Code   TOTAL TIME TAKING CARE OF THIS PATIENT: 33 minutes.    Demetrios Loll M.D on 09/09/2016 at 4:05 PM  Between 7am to 6pm - Pager - [phone]  After 6pm go to [email or web address] - Proofreader  [facility]  Office  [phone]  CC: Primary care physician; [name], MD   Note: This dictation was prepared with Dragon dictation along with smaller phrase technology. Any transcriptional errors that result from this process are unintentional.

## 2016-09-09 NOTE — Discharge Instructions (Signed)
Regular diet until hyponatremia improved. f/u Na with PCP or Dr. Candiss Norse.

## 2016-09-09 NOTE — Progress Notes (Signed)
Patient discharged to home as ordered. IV discontinued site clean dry and intact. Patient is alert and oriented, no acute distress noted. Patient ambulates well without assistance, patient taken home by family.

## 2016-09-09 NOTE — Telephone Encounter (Signed)
Pt is being discharged from [facility] to for abdomen pain.  Am appointment has been scheduled/MW

## 2016-09-09 NOTE — Progress Notes (Signed)
[facility], [place] 09/09/16  Subjective:   Patient is trying to follow sodium restriction Sodium level has improved slightly to 127  Creatinine 1.38, improved TSH is normal Plasma cortisol low normal Urine osmolality low at 242, plasma osmolality also low at 261  Objective:  Vital signs in last 24 hours:  Temp:  [98 F (36.7 C)-99.1 F (37.3 C)] 98 F (36.7 C) (05/10 1236) Pulse Rate:  [63-73] 73 (05/10 1236) Resp:  [18-20] 18 (05/10 1236) BP: (149-176)/(62-71) 149/62 (05/10 1236) SpO2:  [99 %-100 %] 99 % (05/10 1236)  Weight change:  Filed Weights   09/05/16 1641 09/06/16 0032 09/07/16 0947  Weight: 53.5 kg (118 lb) 48.7 kg (107 lb 4.8 oz) 48.5 kg (107 lb)    Intake/Output:    Intake/Output Summary (Last 24 hours) at 09/09/16 1355 Last data filed at 09/09/16 0900  Gross per 24 hour  Intake             5450 ml  Output             1050 ml  Net             4400 ml     Physical Exam: General: No acute distress, sitting up in the bed  HEENT Anicteric, moist mucous membranes  Neck supple  Pulm/lungs Normal effort, clear to auscultation  CVS/Heart Regular rhythm, no rub or gallop  Abdomen:  Soft, nontender  Extremities: No edema  Neurologic: Alert, oriented             Basic Metabolic Panel:   Recent Labs Lab 09/05/16 1648 09/06/16 0038 09/07/16 0520 09/08/16 0417 09/09/16 0549  NA 126* 125* 123* 124* 127*  K 4.1 3.9 3.8 3.9 3.8  CL 96* 96* 97* 97* 101  CO2 20* 20* 20* 22 22  GLUCOSE 108* 116* 122* 106* 99  BUN 33* 29* 22* 24* 24*  CREATININE 1.61* 1.51* 1.29* 1.46* 1.38*  CALCIUM 9.5 9.3 8.7* 8.3* 8.7*     CBC:  Recent Labs Lab 09/05/16 1648 09/06/16 0038  WBC 6.1 7.2  HGB 8.2* 8.2*  HCT 23.6* 23.5*  MCV 94.0 93.6  PLT 241 256     No results found for: HEPBSAG, HEPBSAB, HEPBIGM    Microbiology:  No results found for this or any previous visit (from the past 240 hour(s)).  Coagulation Studies: No  results for input(s): LABPROT, INR in the last 72 hours.  Urinalysis: No results for input(s): COLORURINE, LABSPEC, PHURINE, GLUCOSEU, HGBUR, BILIRUBINUR, KETONESUR, PROTEINUR, UROBILINOGEN, NITRITE, LEUKOCYTESUR in the last 72 hours.  Invalid input(s): APPERANCEUR    Imaging: No results found.   Medications:    . buPROPion  150 mg Oral Daily  . famotidine  20 mg Oral Daily  . irbesartan  300 mg Oral Daily  . LORazepam  1 mg Oral QHS  . metoprolol tartrate  50 mg Oral BID  . pantoprazole  40 mg Oral BID AC  . sodium chloride  1 g Oral TID WC  . zolpidem  5 mg Oral QHS   acetaminophen **OR** acetaminophen, butalbital-acetaminophen-caffeine, hydrALAZINE, ondansetron **OR** ondansetron (ZOFRAN) IV, traMADol  Assessment/ Plan:  66 y.o. female with Anxiety, Gastritis, HTN, CKD who was admitted to [facility] on 09/05/2016 for evaluation of epigastric pain, anemia.   1. Hyponatremia - patient had normal sodium in August of 2017.    Chest x-ray negative for lung mass. TSH normal, serum and urine protein electrophoresis pending - fluid restriction to 1.5 L per  day - Low-dose Lasix daily - Discontinue hydrochlorothiazide  2. CKD - 3. - chronic kidney disease is likely secondary to hypertension - serum creatinine appears to be at baseline 1.38/ GFR 39  3. HTN - Current regimen includes irbesartan, metoprolol  f/u in office next week with pre-labs   LOS: 1 Amy [name] 5/10/20181:55 PM  [facility] [place], [place]

## 2016-09-10 ENCOUNTER — Telehealth: Payer: Self-pay

## 2016-09-10 LAB — PROTEIN ELECTRO, RANDOM URINE
ALBUMIN ELP UR: 75.5 %
ALPHA-2-GLOBULIN, U: 6.3 %
Alpha-1-Globulin, U: 5.6 %
Beta Globulin, U: 9.6 %
Gamma Globulin, U: 2.9 %
TOTAL PROTEIN, URINE-UPE24: 83.2 mg/dL

## 2016-09-10 NOTE — Telephone Encounter (Signed)
Transition Care Management Follow-up Telephone Call    Date discharged? 09/09/16  How have you been since you were released from the hospital? Recovering, still tired. Currently resting and taking it easy.   Any patient concerns? Pt is confused about diagnosis and would like more specifics to what was the cause and why.    Items Reviewed:  Medications reviewed: Yes  Allergies reviewed: Yes  Dietary changes reviewed: N/A  Referrals reviewed: N/A   Functional Questionnaire:  Independent - I Dependent - D    Activities of Daily Living (ADLs):    Personal hygiene - I Dressing - I Eating - I Maintaining continence - I Transferring - I   Independent Activities of Daily Living (iADLs): Basic communication skills - I Transportation - I Meal preparation  - I Shopping - I Housework - I Managing medications - I  Managing personal finances - I   Confirmed importance and date/time of follow-up visits scheduled YES  Provider Appointment booked with PCP 09/23/16 @ 11 AM  Confirmed with patient if condition begins to worsen call PCP or go to the ER.  Patient was given the office number and encouraged to call back with question or concerns: YES

## 2016-09-14 ENCOUNTER — Encounter: Payer: Self-pay | Admitting: Gastroenterology

## 2016-09-19 ENCOUNTER — Other Ambulatory Visit: Payer: Self-pay | Admitting: Family Medicine

## 2016-09-20 NOTE — Telephone Encounter (Signed)
Rx called in to pharmacy. 

## 2016-09-20 NOTE — Telephone Encounter (Signed)
Please call in lorazepam.  

## 2016-09-21 ENCOUNTER — Encounter: Payer: Self-pay | Admitting: Family Medicine

## 2016-09-21 ENCOUNTER — Ambulatory Visit (INDEPENDENT_AMBULATORY_CARE_PROVIDER_SITE_OTHER): Payer: Medicare Other | Admitting: Family Medicine

## 2016-09-21 VITALS — BP 188/78 | HR 68 | Temp 98.5°F | Resp 16 | Wt 119.0 lb

## 2016-09-21 DIAGNOSIS — E871 Hypo-osmolality and hyponatremia: Secondary | ICD-10-CM | POA: Diagnosis not present

## 2016-09-21 DIAGNOSIS — I10 Essential (primary) hypertension: Secondary | ICD-10-CM

## 2016-09-21 DIAGNOSIS — D649 Anemia, unspecified: Secondary | ICD-10-CM

## 2016-09-21 MED ORDER — AMLODIPINE BESYLATE 2.5 MG PO TABS: 2.5000 mg | ORAL_TABLET | Freq: Every day | ORAL | 3 refills | Status: DC

## 2016-09-21 NOTE — Progress Notes (Signed)
Patient: Amy [name] Female    DOB: 07-30-1950   66 y.o.   MRN: [id number] Visit Date: 09/21/2016  Today's Provider: Lelon Huh, MD   Chief Complaint  Patient presents with  . Hospitalization Follow-up   Subjective:    HPI  Follow up Hospitalization  Patient was admitted to [facility] on 09/05/2016 and discharged on 09/09/2016. She was treated for Nonspecific chest pain and abdominal pain. Treatment for this included advising patient to continue Protonix and to follow up with GI as an out patient. HCTZ and Lasix was discharged due to Hyponatremia. Patient is to have follow up BNP check with Dr. Candiss Norse as an out patient. Metoprolol was also added for Hypertension.  Telephone follow up was done on 09/10/2016 She reports good compliance with treatment. She reports this condition is Unchanged.Patient states today she feels bad. She still has some abdominal pain and a heaviness in her chest.   ------------------------------------------------------------------------------------ She initially presented to ER with chest pressure and fatigue and found to have normocytic anemia and hyponatremia. She had EGD showing gastritis, and colonoscopy with adenomatous polyps, but no bleeding. She has consultation with Dr. Candiss Norse and hyponatremia was partially attributed to excessive free water consumption. And diuretic. She was discharge on irbesartan for her blood pressure, but BP has been elevated since discharge.  Anticipated follow up with Dr. Candiss Norse, but apparently referred to a different Singh.       Allergies  Allergen Reactions  . Cephalosporins   . Clarithromycin   . Lunesta  [Eszopiclone]     Heart racing  . Penicillin V     Has patient had a PCN reaction causing immediate rash, facial/tongue/throat swelling, SOB or lightheadedness with hypotension: Yes Has patient had a PCN reaction causing severe rash involving mucus membranes or skin necrosis: No Has patient had a PCN reaction  that required hospitalization No Has patient had a PCN reaction occurring within the last 10 years: No If all of the above answers are "NO", then may proceed with Cephalosporin use.  . Sulfa Antibiotics      Current Outpatient Prescriptions:  .  alendronate (FOSAMAX) 70 MG tablet, Take 1 tablet (70 mg total) by mouth every 7 (seven) days. Take with a full glass of water on an empty stomach., Disp: 12 tablet, Rfl: 4 .  buPROPion (WELLBUTRIN) 75 MG tablet, TAKE 2 TABLETS BY MOUTH DAILY, Disp: 180 tablet, Rfl: 4 .  butalbital-acetaminophen-caffeine (FIORICET, ESGIC) 50-325-40 MG tablet, TAKE 1 TABLET BY MOUTH EVERY 6 HOURS AS NEEDED FOR PAIN, MAX 8 TABLETS DAILY, Disp: 150 tablet, Rfl: 5 .  Cimetidine (TAGAMET PO), Take by mouth 3 times/day as needed-between meals & bedtime., Disp: , Rfl:  .  irbesartan (AVAPRO) 300 MG tablet, Take 1 tablet (300 mg total) by mouth daily., Disp: 30 tablet, Rfl: 2 .  LORazepam (ATIVAN) 1 MG tablet, TAKE 1 TABLET BY MOUTH TWICE DAILY AS NEEDED, Disp: 180 tablet, Rfl: 1 .  metoprolol (LOPRESSOR) 50 MG tablet, Take 1 tablet (50 mg total) by mouth 2 (two) times daily., Disp: 60 tablet, Rfl: 2 .  pantoprazole (PROTONIX) 40 MG tablet, Take 1 tablet (40 mg total) by mouth 2 (two) times daily before a meal., Disp: 60 tablet, Rfl: 0 .  ranitidine (ZANTAC) 150 MG tablet, Take 150 mg by mouth 2 (two) times daily., Disp: , Rfl:  .  valACYclovir (VALTREX) 1000 MG tablet, 2 tablets twice a day for 1 day as needed for cold sores, Disp:  30 tablet, Rfl: 1 .  zolpidem (AMBIEN) 10 MG tablet, TAKE 1 TABLET BY MOUTH EVERY DAY AT BEDTIME AS NEEDED FOR SLEEP, Disp: 30 tablet, Rfl: 5  Review of Systems  Constitutional: Positive for fatigue. Negative for appetite change, chills and fever.  Respiratory: Positive for cough and chest tightness. Negative for shortness of breath.   Cardiovascular: Positive for leg swelling (in ankles). Negative for chest pain and palpitations.    Gastrointestinal: Positive for abdominal pain. Negative for nausea and vomiting.  Neurological: Negative for dizziness and weakness.    Social History  Substance Use Topics  . Smoking status: Never Smoker  . Smokeless tobacco: Never Used  . Alcohol use 0.0 oz/week     Comment: occasional   Objective:   BP (!) 188/78 (BP Location: Left Arm, Cuff Size: Normal)   Pulse 68   Temp 98.5 F (36.9 C) (Oral)   Resp 16   Wt 119 lb (54 kg)   SpO2 99% Comment: room air  BMI 20.43 kg/m  There were no vitals filed for this visit.   Physical Exam   General Appearance:    Alert, cooperative, no distress  Eyes:    PERRL, conjunctiva/corneas clear, EOM's intact       Lungs:     Clear to auscultation bilaterally, respirations unlabored  Heart:    Regular rate and rhythm  Neurologic:   Awake, alert, oriented x 3. No apparent focal neurological           defect.           Assessment & Plan:     1. Anemia, unspecified type New onset, no source of bleeding on endoscopy - CBC - Vitamin B12 - Folate - Iron and TIBC - Sed Rate (ESR) - Retic  2. Hyponatremia Multifactorial. Has reduced free water intake and stopped diuretics. If not corrected will refer back to Dr. Candiss Norse.  - Comprehensive metabolic panel  3. Essential (primary) hypertension Uncontrolled since stopping hctz. Will start amlodipine 2.46m daily         DLelon Huh MD  [facility]

## 2016-09-22 ENCOUNTER — Other Ambulatory Visit: Payer: Self-pay | Admitting: Family Medicine

## 2016-09-22 ENCOUNTER — Telehealth: Payer: Self-pay

## 2016-09-22 DIAGNOSIS — N183 Chronic kidney disease, stage 3 unspecified: Secondary | ICD-10-CM

## 2016-09-22 DIAGNOSIS — D649 Anemia, unspecified: Secondary | ICD-10-CM

## 2016-09-22 LAB — CBC
Hematocrit: 23.5 % — ABNORMAL LOW (ref 34.0–46.6)
Hemoglobin: 8.1 g/dL — ABNORMAL LOW (ref 11.1–15.9)
MCH: 32.3 pg (ref 26.6–33.0)
MCHC: 34.5 g/dL (ref 31.5–35.7)
MCV: 94 fL (ref 79–97)
PLATELETS: 291 10*3/uL (ref 150–379)
RBC: 2.51 x10E6/uL — AB (ref 3.77–5.28)
RDW: 13.4 % (ref 12.3–15.4)
WBC: 8.2 10*3/uL (ref 3.4–10.8)

## 2016-09-22 LAB — COMPREHENSIVE METABOLIC PANEL
A/G RATIO: 2.3 — AB (ref 1.2–2.2)
ALBUMIN: 4.1 g/dL (ref 3.6–4.8)
ALK PHOS: 147 IU/L — AB (ref 39–117)
ALT: 25 IU/L (ref 0–32)
AST: 28 IU/L (ref 0–40)
BUN / CREAT RATIO: 12 (ref 12–28)
BUN: 21 mg/dL (ref 8–27)
Bilirubin Total: <0.2 mg/dL (ref 0.0–1.2)
CO2: 20 mmol/L (ref 18–29)
CREATININE: 1.72 mg/dL — AB (ref 0.57–1.00)
Calcium: 9 mg/dL (ref 8.7–10.3)
Chloride: 103 mmol/L (ref 96–106)
GFR calc Af Amer: 35 mL/min/{1.73_m2} — ABNORMAL LOW (ref >59)
GFR, EST NON AFRICAN AMERICAN: 30 mL/min/{1.73_m2} — AB (ref >59)
GLOBULIN, TOTAL: 1.8 g/dL (ref 1.5–4.5)
Glucose: 96 mg/dL (ref 65–99)
POTASSIUM: 4.1 mmol/L (ref 3.5–5.2)
SODIUM: 139 mmol/L (ref 134–144)
Total Protein: 5.9 g/dL — ABNORMAL LOW (ref 6.0–8.5)

## 2016-09-22 LAB — RETICULOCYTES: RETIC CT PCT: 2.5 % (ref 0.6–2.6)

## 2016-09-22 LAB — IRON AND TIBC
Iron Saturation: 20 % (ref 15–55)
Iron: 44 ug/dL (ref 27–139)
Total Iron Binding Capacity: 219 ug/dL — ABNORMAL LOW (ref 250–450)
UIBC: 175 ug/dL (ref 118–369)

## 2016-09-22 LAB — SEDIMENTATION RATE: SED RATE: 8 mm/h (ref 0–40)

## 2016-09-22 LAB — FOLATE: Folate: 16 ng/mL (ref >3.0)

## 2016-09-22 LAB — VITAMIN B12: VITAMIN B 12: 827 pg/mL (ref 232–1245)

## 2016-09-22 NOTE — Telephone Encounter (Signed)
-----   Message from [name], MD sent at 09/22/2016  7:54 AM EDT ----- Sodium levels are back to normal, but kidney functions are worse. Need to go ahead and follow up with Dr. Candiss Norse, referral sent to [facility]. Is still very anemic, but no sign of any vitamin or mineral deficiencies. Need referral to hematology .referral has been sent to [place].

## 2016-09-22 NOTE — Telephone Encounter (Signed)
Patient advised as below. Patient verbalizes understanding and is in agreement with treatment plan.  

## 2016-09-23 ENCOUNTER — Emergency Department: Payer: Medicare Other

## 2016-09-23 ENCOUNTER — Observation Stay
Admission: EM | Admit: 2016-09-23 | Discharge: 2016-09-27 | Disposition: A | Discharge status: Home / Self Care | Payer: Medicare Other | Attending: Internal Medicine | Admitting: Internal Medicine

## 2016-09-23 ENCOUNTER — Encounter: Payer: Self-pay | Admitting: Emergency Medicine

## 2016-09-23 ENCOUNTER — Inpatient Hospital Stay: Payer: 59 | Admitting: Family Medicine

## 2016-09-23 DIAGNOSIS — D649 Anemia, unspecified: Secondary | ICD-10-CM | POA: Insufficient documentation

## 2016-09-23 DIAGNOSIS — I517 Cardiomegaly: Secondary | ICD-10-CM | POA: Diagnosis not present

## 2016-09-23 DIAGNOSIS — R51 Headache: Secondary | ICD-10-CM | POA: Insufficient documentation

## 2016-09-23 DIAGNOSIS — I10 Essential (primary) hypertension: Secondary | ICD-10-CM

## 2016-09-23 DIAGNOSIS — M7989 Other specified soft tissue disorders: Secondary | ICD-10-CM | POA: Diagnosis not present

## 2016-09-23 DIAGNOSIS — R0602 Shortness of breath: Secondary | ICD-10-CM

## 2016-09-23 DIAGNOSIS — I700 Atherosclerosis of aorta: Secondary | ICD-10-CM | POA: Diagnosis not present

## 2016-09-23 DIAGNOSIS — Z7982 Long term (current) use of aspirin: Secondary | ICD-10-CM | POA: Diagnosis not present

## 2016-09-23 DIAGNOSIS — R188 Other ascites: Secondary | ICD-10-CM | POA: Diagnosis not present

## 2016-09-23 DIAGNOSIS — Z79899 Other long term (current) drug therapy: Secondary | ICD-10-CM | POA: Insufficient documentation

## 2016-09-23 DIAGNOSIS — R079 Chest pain, unspecified: Secondary | ICD-10-CM | POA: Diagnosis present

## 2016-09-23 DIAGNOSIS — Z880 Allergy status to penicillin: Secondary | ICD-10-CM | POA: Diagnosis not present

## 2016-09-23 DIAGNOSIS — I110 Hypertensive heart disease with heart failure: Secondary | ICD-10-CM | POA: Insufficient documentation

## 2016-09-23 DIAGNOSIS — K2970 Gastritis, unspecified, without bleeding: Secondary | ICD-10-CM | POA: Insufficient documentation

## 2016-09-23 DIAGNOSIS — R600 Localized edema: Secondary | ICD-10-CM | POA: Diagnosis not present

## 2016-09-23 DIAGNOSIS — I5031 Acute diastolic (congestive) heart failure: Secondary | ICD-10-CM | POA: Diagnosis not present

## 2016-09-23 DIAGNOSIS — Z90710 Acquired absence of both cervix and uterus: Secondary | ICD-10-CM | POA: Diagnosis not present

## 2016-09-23 DIAGNOSIS — R519 Headache, unspecified: Secondary | ICD-10-CM

## 2016-09-23 DIAGNOSIS — Z9049 Acquired absence of other specified parts of digestive tract: Secondary | ICD-10-CM | POA: Insufficient documentation

## 2016-09-23 DIAGNOSIS — R0789 Other chest pain: Secondary | ICD-10-CM

## 2016-09-23 DIAGNOSIS — F419 Anxiety disorder, unspecified: Secondary | ICD-10-CM | POA: Diagnosis not present

## 2016-09-23 DIAGNOSIS — N183 Chronic kidney disease, stage 3 (moderate): Secondary | ICD-10-CM | POA: Insufficient documentation

## 2016-09-23 DIAGNOSIS — G8929 Other chronic pain: Secondary | ICD-10-CM

## 2016-09-23 DIAGNOSIS — K219 Gastro-esophageal reflux disease without esophagitis: Secondary | ICD-10-CM | POA: Diagnosis not present

## 2016-09-23 HISTORY — DX: Anemia, unspecified: D64.9

## 2016-09-23 HISTORY — DX: Gastritis, unspecified, without bleeding: K29.70

## 2016-09-23 HISTORY — DX: Chronic kidney disease, stage 3 unspecified: N18.30

## 2016-09-23 HISTORY — DX: Polyp of colon: K63.5

## 2016-09-23 HISTORY — DX: Nontoxic multinodular goiter: E04.2

## 2016-09-23 HISTORY — DX: Chronic kidney disease, stage 3 (moderate): N18.3

## 2016-09-23 HISTORY — DX: Hypo-osmolality and hyponatremia: E87.1

## 2016-09-23 LAB — TROPONIN I: Troponin I: <0.03 ng/mL (ref <0.03)

## 2016-09-23 LAB — CBC
HEMATOCRIT: 24.2 % — AB (ref 35.0–47.0)
HEMOGLOBIN: 8.4 g/dL — AB (ref 12.0–16.0)
MCH: 32.8 pg (ref 26.0–34.0)
MCHC: 34.7 g/dL (ref 32.0–36.0)
MCV: 94.7 fL (ref 80.0–100.0)
Platelets: 278 10*3/uL (ref 150–440)
RBC: 2.56 MIL/uL — ABNORMAL LOW (ref 3.80–5.20)
RDW: 13.5 % (ref 11.5–14.5)
WBC: 8.9 10*3/uL (ref 3.6–11.0)

## 2016-09-23 LAB — BASIC METABOLIC PANEL
Anion gap: 9 (ref 5–15)
BUN: 27 mg/dL — AB (ref 6–20)
CO2: 21 mmol/L — ABNORMAL LOW (ref 22–32)
Calcium: 9 mg/dL (ref 8.9–10.3)
Chloride: 104 mmol/L (ref 101–111)
Creatinine, Ser: 1.57 mg/dL — ABNORMAL HIGH (ref 0.44–1.00)
GFR, EST AFRICAN AMERICAN: 38 mL/min — AB (ref >60)
GFR, EST NON AFRICAN AMERICAN: 33 mL/min — AB (ref >60)
Glucose, Bld: 102 mg/dL — ABNORMAL HIGH (ref 65–99)
POTASSIUM: 3.5 mmol/L (ref 3.5–5.1)
Sodium: 134 mmol/L — ABNORMAL LOW (ref 135–145)

## 2016-09-23 LAB — BRAIN NATRIURETIC PEPTIDE: B NATRIURETIC PEPTIDE 5: 370 pg/mL — AB (ref 0.0–100.0)

## 2016-09-23 MED ORDER — ASPIRIN 81 MG PO CHEW
324.0000 mg | CHEWABLE_TABLET | Freq: Once | ORAL | Status: AC
  Administered 2016-09-23: 324 mg via ORAL
  Filled 2016-09-23: qty 4

## 2016-09-23 MED ORDER — METOPROLOL TARTRATE 50 MG PO TABS
50.0000 mg | ORAL_TABLET | Freq: Once | ORAL | Status: AC
  Administered 2016-09-23: 50 mg via ORAL
  Filled 2016-09-23: qty 1

## 2016-09-23 MED ORDER — HYDRALAZINE HCL 20 MG/ML IJ SOLN
10.0000 mg | Freq: Once | INTRAMUSCULAR | Status: AC
  Administered 2016-09-23: 10 mg via INTRAVENOUS
  Filled 2016-09-23: qty 1

## 2016-09-23 NOTE — H&P (Addendum)
[place] @ [facility] Admission History and Physical Amy [name], D.O.  ---------------------------------------------------------------------------------------------------------------------   PATIENT NAME: Amy [name] MR#: [id number] DATE OF BIRTH: 22-Nov-1950 DATE OF ADMISSION: 09/23/2016 PRIMARY CARE PHYSICIAN: [name], MD  REQUESTING/REFERRING PHYSICIAN: ED Dr. Reita Cliche  CHIEF COMPLAINT: Chief Complaint  Patient presents with  . Chest Pain  . Shortness of Breath  . Leg Swelling    HISTORY OF PRESENT ILLNESS: Amy [name] is a 66 y.o. female with a known history of anxiety, GERD, HTN, CKD3, iron deficiency, anemia  thyroid nodules s/p recent hospitalization for hypertension and hyponatremia  presents to the emergency department for evaluation of chest pain.  Patient states that sometime after her discharge from the hospital 2 weeks ago she developed left sided chest pain described as pressure which is central, nonradiating, constant, worse with activity and better with rest. She reports associated shortness of breath which is ongoing as well as anxiety but she denies nausea, vomiting, diaphoresis. She also states that she has an ongoing cough.  Of note during her last hospitalization she was found be hyponatremic for which she was taken off hydralazine and started on Avapro and metoprolol. Her primary care doctor recently added Norvasc. Since then she reports mild edema of the lower extremities. Also of note she had an endoscopy and colonoscopy during her previous hospitalization which was significant for  cecal polyps, colonic diverticula, rectal polyp, gastritis. Otherwise there has been no change in status. Patient has been taking medication as prescribed and there has been no recent change in medication or diet.  There has been no recent illness, travel or sick contacts.    Patient denies fevers/chills, weakness, dizziness, N/V/C/D, abdominal pain,  dysuria/frequency, changes in mental status.   EMS/ED COURSE:   Patient received Aspirin 324, hydralazine, lopressor.  PAST MEDICAL HISTORY: Past Medical History:  Diagnosis Date  . Anxiety   . GERD (gastroesophageal reflux disease)   . History of chicken pox   . History of measles as a child   . Hypertension   anxiety, GERD, HTN, CKD3, thyroid nodule   PAST SURGICAL HISTORY: Past Surgical History:  Procedure Laterality Date  . ABDOMINAL HYSTERECTOMY  2003   due to heavy periods and clotting during menses  . BREAST LUMPECTOMY Left 1990  . CHOLECYSTECTOMY  2010  . COLONOSCOPY WITH PROPOFOL N/A 09/07/2016   Procedure: COLONOSCOPY WITH PROPOFOL;  Surgeon: Lucilla Lame, MD;  Location: [facility] ENDOSCOPY;  Service: Endoscopy;  Laterality: N/A;  . DILATION AND CURETTAGE OF UTERUS  1985  . ESOPHAGOGASTRODUODENOSCOPY (EGD) WITH PROPOFOL N/A 09/07/2016   Procedure: ESOPHAGOGASTRODUODENOSCOPY (EGD) WITH PROPOFOL;  Surgeon: Lucilla Lame, MD;  Location: [facility] ENDOSCOPY;  Service: Endoscopy;  Laterality: N/A;  . GALLBLADDER SURGERY  2012  . KNEE SURGERY     Dr. Tamala Julian  . MRI, Lumbar spine  07/23/2009   Multilevel annular bulge and disc protrusion at L2-L3, L3-L4, L4-L5, and L5-S1  . TONSILLECTOMY    . TONSILLECTOMY AND ADENOIDECTOMY  1970  . UPPER GI ENDOSCOPY  03/02/2006   H. Pylori negative; Dr. Allen Norris      SOCIAL HISTORY: Social History  Substance Use Topics  . Smoking status: Never Smoker  . Smokeless tobacco: Never Used  . Alcohol use 0.0 oz/week     Comment: occasional      FAMILY HISTORY: Family History  Problem Relation Age of Onset  . Pancreatic cancer Father   . Congestive Heart Failure Maternal Grandmother   . Cancer Paternal Grandfather   .  Heart disease Maternal Uncle      MEDICATIONS AT HOME: Prior to Admission medications   Medication Sig Start Date End Date Taking? Authorizing Provider  buPROPion (WELLBUTRIN) 75 MG tablet TAKE 2 TABLETS BY MOUTH DAILY 08/18/15  Yes  Fisher, Kirstie Peri, MD  butalbital-acetaminophen-caffeine (FIORICET, ESGIC) 50-325-40 MG tablet TAKE 1 TABLET BY MOUTH EVERY 6 HOURS AS NEEDED FOR PAIN, MAX 8 TABLETS DAILY 09/07/16  Yes [name], MD  Cimetidine (TAGAMET PO) Take 1 tablet by mouth 3 (three) times daily.   Yes [provider]  irbesartan (AVAPRO) 300 MG tablet Take 1 tablet (300 mg total) by mouth daily. 09/09/16  Yes Demetrios Loll, MD  LORazepam (ATIVAN) 1 MG tablet TAKE 1 TABLET BY MOUTH TWICE DAILY AS NEEDED 09/20/16  Yes [name], MD  metoprolol (LOPRESSOR) 50 MG tablet Take 1 tablet (50 mg total) by mouth 2 (two) times daily. 09/09/16  Yes Demetrios Loll, MD  pantoprazole (PROTONIX) 40 MG tablet Take 1 tablet (40 mg total) by mouth 2 (two) times daily before a meal. 09/09/16  Yes Demetrios Loll, MD  ranitidine (ZANTAC) 150 MG tablet Take 150 mg by mouth 2 (two) times daily.   Yes [provider]  valACYclovir (VALTREX) 1000 MG tablet 2 tablets twice a day for 1 day as needed for cold sores 06/24/15  Yes [name], MD  zolpidem (AMBIEN) 10 MG tablet TAKE 1 TABLET BY MOUTH EVERY DAY AT BEDTIME AS NEEDED FOR SLEEP 08/24/16  Yes [name], MD  alendronate (FOSAMAX) 70 MG tablet Take 1 tablet (70 mg total) by mouth every 7 (seven) days. Take with a full glass of water on an empty stomach. Patient not taking: Reported on 09/23/2016 12/11/15   [name], MD  amLODipine (NORVASC) 2.5 MG tablet Take 1 tablet (2.5 mg total) by mouth daily. Patient not taking: Reported on 09/23/2016 09/21/16   [name], MD      DRUG ALLERGIES: Allergies  Allergen Reactions  . Cephalosporins   . Clarithromycin   . Lunesta  [Eszopiclone]     Heart racing  . Penicillin V     Has patient had a PCN reaction causing immediate rash, facial/tongue/throat swelling, SOB or lightheadedness with hypotension: Yes Has patient had a PCN reaction causing severe rash involving mucus membranes or skin necrosis: No Has patient  had a PCN reaction that required hospitalization No Has patient had a PCN reaction occurring within the last 10 years: No If all of the above answers are "NO", then may proceed with Cephalosporin use.  . Sulfa Antibiotics     REVIEW OF SYSTEMS: CONSTITUTIONAL: No fatigue, weakness, fever, chills, weight gain/loss, Positive headache EYES: No blurry or double vision. ENT: No tinnitus, postnasal drip, redness or soreness of the oropharynx. RESPIRATORY: Positive dyspnea, cough, negative wheeze, hemoptysis. CARDIOVASCULAR: Positive chest pain, palpitations, negative orthopnea,  syncope. GASTROINTESTINAL: Positive epigastric burning. No nausea, vomiting, constipation, diarrhea, abdominal pain. No hematemesis, melena or hematochezia. GENITOURINARY: No dysuria, frequency, hematuria. ENDOCRINE: No polyuria or nocturia. No heat or cold intolerance. HEMATOLOGY: No anemia, bruising, bleeding. INTEGUMENTARY: No rashes, ulcers, lesions. MUSCULOSKELETAL: No pain, arthritis, swelling, gout. NEUROLOGIC: No numbness, tingling, weakness or ataxia. No seizure-type activity. PSYCHIATRIC: No anxiety, depression, insomnia.  PHYSICAL EXAMINATION: VITAL SIGNS: Blood pressure (!) 190/87, pulse 78, temperature 98.7 F (37.1 C), temperature source Oral, resp. rate 16, height 5\' 4"  (1.626 m), weight 54 kg (119 lb), SpO2 100 %.  GENERAL: 66 y.o.-year-old female patient, well-developed,  well-nourished lying in the bed in no acute distress.  Pleasant and cooperative.  Anxious. HEENT: Head atraumatic, normocephalic. Pupils equal, round, reactive to light and accommodation. No scleral icterus. Extraocular muscles intact. Oropharynx is clear. Mucus membranes moist. NECK: Supple, full range of motion. No JVD, no bruit heard. No cervical lymphadenopathy. CHEST: Normal breath sounds bilaterally. No wheezing, rales, rhonchi or crackles. No use of accessory muscles of respiration.  No reproducible chest wall tenderness.   CARDIOVASCULAR: S1, S2 normal. No murmurs, rubs, or gallops appreciated. Cap refill <2 seconds. ABDOMEN: Soft, nontender, nondistended. No rebound, guarding, rigidity. Normoactive bowel sounds present in all four quadrants. No organomegaly or mass. EXTREMITIES: Full range of motion. No pedal edema, cyanosis, or clubbing. NEUROLOGIC: Cranial nerves II through XII are grossly intact with no focal sensorimotor deficit. Muscle strength 5/5 in all extremities. Sensation intact. Gait not checked. PSYCHIATRIC: The patient is alert and oriented x 3. Normal affect, mood, thought content. SKIN: Warm, dry, and intact without obvious rash, lesion, or ulcer.  LABORATORY PANEL: CBC  Recent Labs Lab 09/23/16 1826  WBC 8.9  HGB 8.4*  HCT 24.2*  PLT 278   ----------------------------------------------------------------------------------------------------------------- Chemistries  Recent Labs Lab 09/21/16 1521 09/23/16 1826  NA 139 134*  K 4.1 3.5  CL 103 104  CO2 20 21*  GLUCOSE 96 102*  BUN 21 27*  CREATININE 1.72* 1.57*  CALCIUM 9.0 9.0  AST 28  --   ALT 25  --   ALKPHOS 147*  --   BILITOT <0.2  --    ------------------------------------------------------------------------------------------------------------------ Cardiac Enzymes  Recent Labs Lab 09/23/16 1826  TROPONINI <0.03   ------------------------------------------------------------------------------------------------------------------  RADIOLOGY: Dg Chest 2 View  Result Date: 09/23/2016 CLINICAL DATA:  Chest pain for 2-3 days.  Lower extremity swelling EXAM: CHEST  2 VIEW COMPARISON:  09/05/2016 FINDINGS: Pulmonary vascular congestion and small pleural effusions. No air bronchogram or generalized Kerley lines. Stable normal heart size. Mild aortic tortuosity. No acute osseous finding.  Remote left humeral neck fracture. IMPRESSION: Pulmonary vascular congestion and small pleural effusions, findings of mild CHF.  Electronically Signed   By: Monte Fantasia M.D.   On: 09/23/2016 18:48    EKG: Normal sinus rhythm at 90 bpm with normal axis and nonspecific ST-T wave changes.   IMPRESSION AND PLAN:  This is a 66 y.o. female with a history of anxiety, GERD, HTN, CKD3, thyroid noduleand deficiency, anemia  now being admitted with:  #. Chest pain, rule out ACS, ? hypertensive urgency, also with evidence of mild congestive heart failure given elevated BNP and pulmonary vascular congestion on chest x-ray, symptoms of shortness of breath and dyspnea on exertion - Admit to observation with telemetry monitoring. - Trend troponins, check lipids and TSH. - Morphine, nitro, beta blocker, aspirin and statin ordered.   - Check echo given ongoing symptoms associated with shortness of breath as well as elevated BNP - Intake/output, daily weight. - Cardiology consult requested of Dr. Lise Auer  #. Uncontrolled HTN, ? 2/2 anxiety - Continue home meds Norvasc, Avapro, metoprolol, hydralazine PRN  #. Questionable symptomatic anemia - prior HgB in 2016 12.3, recent diagnosis of iron deficiency  - Consider trial of packed red blood cell  Transfusion -Continue iron   #. Mild hyponatremia, significantly improved from previous hospitalization -Monitor BMP  #. History of CK D3, baseline -Monitor his BMP  #. History of GERD -Continue Protonix before meals and Pepcid once daily  #. History of depression/anxiety -Continue Wellbutrin, Ativan  Admission status: Observation, telemetry Diet/Nutrition:  Heart healthy Fluids: HL DVT Px: Lovenox, SCDs and early ambulation Code Status: Full Disposition Plan: To home in <24 hours  All the records are reviewed and case discussed with ED provider. Management plans discussed with the patient and/or family who express understanding and agree with plan of care.   TOTAL TIME TAKING CARE OF THIS PATIENT: 60 minutes.   Wesson Stith D.O. on 09/23/2016 at 11:45 PM Between  7am to 6pm - Pager - [phone] After 6pm go to [email or web address] - [employer]reader [employer] [place] [facility] Office [phone] CC: Primary care physician; [name], MD     Note: This dictation was prepared with Dragon dictation along with smaller phrase technology. Any transcriptional errors that result from this process are unintentional.

## 2016-09-23 NOTE — ED Provider Notes (Signed)
[facility] Emergency Department Provider Note ____________________________________________   I have reviewed the triage vital signs and the triage nursing note.  HISTORY  Chief Complaint Chest Pain; Shortness of Breath; and Leg Swelling   Historian Patient and neighbors  HPI Amy [name] is a 66 y.o. female with a history of anxiety, GERD, and recent hospitalization 2 weeks ago for elevated blood pressures, hyponatremia, and some renal dysfunction, presents today with several days of chest pressure, today with a feeling of flushed like her blood pressure was up when she took it it was over 161 systolic. No nausea or sweats although she hasn't eaten since this morning. She does feel quite anxious about what going on with her. She is particularly worried about the anemia of 8 and where it's coming from. She states that she had a colonoscopy and endoscopy which were "normal. "States that when she was in the hospital they changed her medication, off of how to chlorothiazide, and on to metoprolol 50 mg twice daily. Since she has been home she's been having increased lower extremity edema. She's had a mild cough without sinus congestion or fevers.    Past Medical History:  Diagnosis Date  . Anxiety   . GERD (gastroesophageal reflux disease)   . History of chicken pox   . History of measles as a child   . Hypertension     Patient Active Problem List   Diagnosis Date Noted  . History of adenomatous polyp of colon 09/07/2016  . Benign neoplasm of cecum   . Rectal polyp   . Abdominal pain 09/05/2016  . Anemia 09/05/2016  . Chronic kidney disease (CKD), active medical management without dialysis, stage 3 (moderate) 02/05/2016  . Chronic headache 12/11/2015  . Multiple thyroid nodules 07/04/2015  . Right thyroid nodule 06/27/2015  . Allergic rhinitis, seasonal 12/03/2014  . Fever blister 12/03/2014  . Herniated lumbar intervertebral disc 12/03/2014  . Recurrent  sinus infections 12/03/2014  . Insomnia 11/05/2014  . Acquired scoliosis 06/26/2014  . Intervertebral disc stenosis of neural canal 06/20/2014  . Degenerative spondylolisthesis 03/12/2014  . OP (osteoporosis) 08/10/2013  . Idiopathic peripheral neuropathy 03/24/2009  . Edema 12/27/2007  . Headache, tension-type 10/25/2006  . Essential (primary) hypertension 10/24/2006  . Anxiety 05/03/1998  . Esophageal reflux 05/03/1998    Past Surgical History:  Procedure Laterality Date  . ABDOMINAL HYSTERECTOMY  2003   due to heavy periods and clotting during menses  . BREAST LUMPECTOMY Left 1990  . CHOLECYSTECTOMY  2010  . COLONOSCOPY WITH PROPOFOL N/A 09/07/2016   Procedure: COLONOSCOPY WITH PROPOFOL;  Surgeon: Lucilla Lame, MD;  Location: [facility] ENDOSCOPY;  Service: Endoscopy;  Laterality: N/A;  . DILATION AND CURETTAGE OF UTERUS  1985  . ESOPHAGOGASTRODUODENOSCOPY (EGD) WITH PROPOFOL N/A 09/07/2016   Procedure: ESOPHAGOGASTRODUODENOSCOPY (EGD) WITH PROPOFOL;  Surgeon: Lucilla Lame, MD;  Location: [facility] ENDOSCOPY;  Service: Endoscopy;  Laterality: N/A;  . GALLBLADDER SURGERY  2012  . KNEE SURGERY     Dr. Tamala Julian  . MRI, Lumbar spine  07/23/2009   Multilevel annular bulge and disc protrusion at L2-L3, L3-L4, L4-L5, and L5-S1  . TONSILLECTOMY    . TONSILLECTOMY AND ADENOIDECTOMY  1970  . UPPER GI ENDOSCOPY  03/02/2006   H. Pylori negative; Dr. Allen Norris    Prior to Admission medications   Medication Sig Start Date End Date Taking? Authorizing Provider  buPROPion (WELLBUTRIN) 75 MG tablet TAKE 2 TABLETS BY MOUTH DAILY 08/18/15  Yes [name], MD  butalbital-acetaminophen-caffeine (  FIORICET, ESGIC) 50-325-40 MG tablet TAKE 1 TABLET BY MOUTH EVERY 6 HOURS AS NEEDED FOR PAIN, MAX 8 TABLETS DAILY 09/07/16  Yes [name], MD  Cimetidine (TAGAMET PO) Take 1 tablet by mouth 3 (three) times daily.   Yes [provider]  irbesartan (AVAPRO) 300 MG tablet Take 1 tablet (300 mg total) by mouth  daily. 09/09/16  Yes Demetrios Loll, MD  LORazepam (ATIVAN) 1 MG tablet TAKE 1 TABLET BY MOUTH TWICE DAILY AS NEEDED 09/20/16  Yes [name], MD  metoprolol (LOPRESSOR) 50 MG tablet Take 1 tablet (50 mg total) by mouth 2 (two) times daily. 09/09/16  Yes Demetrios Loll, MD  pantoprazole (PROTONIX) 40 MG tablet Take 1 tablet (40 mg total) by mouth 2 (two) times daily before a meal. 09/09/16  Yes Demetrios Loll, MD  ranitidine (ZANTAC) 150 MG tablet Take 150 mg by mouth 2 (two) times daily.   Yes [provider]  valACYclovir (VALTREX) 1000 MG tablet 2 tablets twice a day for 1 day as needed for cold sores 06/24/15  Yes [name], MD  zolpidem (AMBIEN) 10 MG tablet TAKE 1 TABLET BY MOUTH EVERY DAY AT BEDTIME AS NEEDED FOR SLEEP 08/24/16  Yes [name], MD  alendronate (FOSAMAX) 70 MG tablet Take 1 tablet (70 mg total) by mouth every 7 (seven) days. Take with a full glass of water on an empty stomach. Patient not taking: Reported on 09/23/2016 12/11/15   [name], MD  amLODipine (NORVASC) 2.5 MG tablet Take 1 tablet (2.5 mg total) by mouth daily. Patient not taking: Reported on 09/23/2016 09/21/16   [name], MD    Allergies  Allergen Reactions  . Cephalosporins   . Clarithromycin   . Lunesta  [Eszopiclone]     Heart racing  . Penicillin V     Has patient had a PCN reaction causing immediate rash, facial/tongue/throat swelling, SOB or lightheadedness with hypotension: Yes Has patient had a PCN reaction causing severe rash involving mucus membranes or skin necrosis: No Has patient had a PCN reaction that required hospitalization No Has patient had a PCN reaction occurring within the last 10 years: No If all of the above answers are "NO", then may proceed with Cephalosporin use.  . Sulfa Antibiotics     Family History  Problem Relation Age of Onset  . Pancreatic cancer Father   . Congestive Heart Failure Maternal Grandmother   . Cancer Paternal Grandfather   . Heart  disease Maternal Uncle     Social History Social History  Substance Use Topics  . Smoking status: Never Smoker  . Smokeless tobacco: Never Used  . Alcohol use 0.0 oz/week     Comment: occasional    Review of Systems  Constitutional: Negative for fever. Eyes: Negative for visual changes. ENT: Negative for Sinus drainage. Cardiovascular: Positive for chest pain. Respiratory: Positive for shortness of breath. Gastrointestinal: Negative for abdominal pain, vomiting and diarrhea. Genitourinary: Negative for dysuria. Musculoskeletal: Negative for back pain. Skin: Negative for rash. Neurological: Negative for headache.  ____________________________________________   PHYSICAL EXAM:  VITAL SIGNS: ED Triage Vitals  Enc Vitals Group     BP 09/23/16 1823 (!) 199/87     Pulse Rate 09/23/16 1823 88     Resp 09/23/16 1823 18     Temp 09/23/16 1823 98.7 F (37.1 C)     Temp Source 09/23/16 1823 Oral     SpO2 09/23/16 1823 100 %  Weight 09/23/16 1823 119 lb (54 kg)     Height 09/23/16 1823 5\' 4"  (1.626 m)     Head Circumference --      Peak Flow --      Pain Score 09/23/16 1822 5     Pain Loc --      Pain Edu? --      Excl. in [place]? --      Constitutional: Alert and oriented. Well appearing and in no distress.   HEENT   Head: Normocephalic and atraumatic.      Eyes: Conjunctivae are normal. Pupils equal and round.       Ears:         Nose: No congestion/rhinnorhea.   Mouth/Throat: Mucous membranes are moist.   Neck: No stridor. Cardiovascular/Chest: Normal rate, regular rhythm.  No murmurs, rubs, or gallops. Respiratory: Normal respiratory effort without tachypnea nor retractions. Breath sounds are clear and equal bilaterally. No wheezes/rales/rhonchi. Gastrointestinal: Soft. No distention, no guarding, no rebound. Nontender.   Genitourinary/rectal:Deferred Musculoskeletal: Nontender with normal range of motion in all extremities. No joint effusions.  No lower  extremity tenderness.  1+LE pitting edema Neurologic:  Normal speech and language. No gross or focal neurologic deficits are appreciated. Skin:  Skin is warm, dry and intact. No rash noted. Psychiatric: Mood and affect are normal. Speech and behavior are normal. Patient exhibits appropriate insight and judgment.   ____________________________________________  LABS (pertinent positives/negatives)  Labs Reviewed  BASIC METABOLIC PANEL - Abnormal; Notable for the following:       Result Value   Sodium 134 (*)    CO2 21 (*)    Glucose, Bld 102 (*)    BUN 27 (*)    Creatinine, Ser 1.57 (*)    GFR calc non Af Amer 33 (*)    GFR calc Af Amer 38 (*)    All other components within normal limits  CBC - Abnormal; Notable for the following:    RBC 2.56 (*)    Hemoglobin 8.4 (*)    HCT 24.2 (*)    All other components within normal limits  BRAIN NATRIURETIC PEPTIDE - Abnormal; Notable for the following:    B Natriuretic Peptide 370.0 (*)    All other components within normal limits  TROPONIN I    ____________________________________________    EKG I, Lisa Roca, MD, the attending physician have personally viewed and interpreted all ECGs.  90 bpm normal sinus rhythm. Narrow QRS. Normal axis. Normal ST and T-wave ____________________________________________  RADIOLOGY All Xrays were viewed by me. Imaging interpreted by Radiologist.  Chest x-ray:  IMPRESSION: Pulmonary vascular congestion and small pleural effusions, findings of mild CHF. __________________________________________  PROCEDURES  Procedure(s) performed: None  Critical Care performed: None  ____________________________________________   ED COURSE / ASSESSMENT AND PLAN  Pertinent labs & imaging results that were available during my care of the patient were reviewed by me and considered in my medical decision making (see chart for details).   States is here initially with a complaint of elevated blood  pressures. It sounds like she was changed off of blood pressure medication to weeks ago when she was in the hospital with mild renal failure and hyponatremia, and is now his pharyngeal extremity edema. Initially I was because she was taken up" [place]. We also discussed that chronically elevated hypertension would be best managed with slow decrease in blood pressure to minimize the risk of inducing stroke due to autoregulation.  She's been complaining of some chest pressure, and  her EKG is reassuring and her sugars troponin is reassuring. She's also reported mild cough. Her chest x-ray does show mild possible pulmonary edema. Her BNP is minimally elevated around 300. She does have lower extremity edema, and in questioning the possibility of congestive heart failure.  I did discuss with her outpatient versus inpatient evaluation and further investigation. Patient has been quite anxious about getting to the bottom of the anemia of 8, elevated blood pressures, as she's had difficulty obtaining renal consult as an outpatient up to this point.  I think in this case it's probably best to go ahead and do a chest pain observation and evaluation for edema and possible congestive heart failure. Patient is much woke up with this plan.     CONSULTATIONS:   Hospitalist for admission.   Patient / Family / Caregiver informed of clinical course, medical decision-making process, and agree with plan.  ___________________________________________   FINAL CLINICAL IMPRESSION(S) / ED DIAGNOSES   Final diagnoses:  Hypertension, unspecified type  Chest tightness or pressure  Leg swelling              Note: This dictation was prepared with Dragon dictation. Any transcriptional errors that result from this process are unintentional    Lisa Roca, MD 09/23/16 2229

## 2016-09-23 NOTE — ED Triage Notes (Signed)
Pt reports non radiating central chest pain that worsened today. Pt reports SOB today as well. Pt reports mid back pain. Pt reports new onset swelling to lower legs bilaterally that began today. Pt presents with 1+ pitting edema to feet bilaterally.

## 2016-09-23 NOTE — ED Notes (Signed)
Pt taken to US

## 2016-09-24 ENCOUNTER — Encounter: Payer: Self-pay | Admitting: Nurse Practitioner

## 2016-09-24 ENCOUNTER — Observation Stay (HOSPITAL_BASED_OUTPATIENT_CLINIC_OR_DEPARTMENT_OTHER)
Admit: 2016-09-24 | Discharge: 2016-09-24 | Disposition: A | Discharge status: Home / Self Care | Payer: Medicare Other | Attending: Family Medicine | Admitting: Family Medicine

## 2016-09-24 DIAGNOSIS — R079 Chest pain, unspecified: Secondary | ICD-10-CM | POA: Diagnosis not present

## 2016-09-24 DIAGNOSIS — D649 Anemia, unspecified: Secondary | ICD-10-CM

## 2016-09-24 DIAGNOSIS — R51 Headache: Secondary | ICD-10-CM | POA: Diagnosis not present

## 2016-09-24 DIAGNOSIS — G8929 Other chronic pain: Secondary | ICD-10-CM

## 2016-09-24 DIAGNOSIS — F419 Anxiety disorder, unspecified: Secondary | ICD-10-CM | POA: Diagnosis not present

## 2016-09-24 DIAGNOSIS — K2950 Unspecified chronic gastritis without bleeding: Secondary | ICD-10-CM

## 2016-09-24 DIAGNOSIS — I10 Essential (primary) hypertension: Secondary | ICD-10-CM | POA: Diagnosis not present

## 2016-09-24 DIAGNOSIS — R0789 Other chest pain: Secondary | ICD-10-CM | POA: Diagnosis not present

## 2016-09-24 DIAGNOSIS — I340 Nonrheumatic mitral (valve) insufficiency: Secondary | ICD-10-CM | POA: Diagnosis not present

## 2016-09-24 DIAGNOSIS — M7989 Other specified soft tissue disorders: Secondary | ICD-10-CM

## 2016-09-24 DIAGNOSIS — K2970 Gastritis, unspecified, without bleeding: Secondary | ICD-10-CM

## 2016-09-24 LAB — CBC
HCT: 24.2 % — ABNORMAL LOW (ref 35.0–47.0)
Hemoglobin: 8.5 g/dL — ABNORMAL LOW (ref 12.0–16.0)
MCH: 32.5 pg (ref 26.0–34.0)
MCHC: 35 g/dL (ref 32.0–36.0)
MCV: 92.8 fL (ref 80.0–100.0)
Platelets: 306 10*3/uL (ref 150–440)
RBC: 2.61 MIL/uL — ABNORMAL LOW (ref 3.80–5.20)
RDW: 13.2 % (ref 11.5–14.5)
WBC: 9.6 10*3/uL (ref 3.6–11.0)

## 2016-09-24 LAB — BASIC METABOLIC PANEL
ANION GAP: 10 (ref 5–15)
BUN: 25 mg/dL — ABNORMAL HIGH (ref 6–20)
CHLORIDE: 106 mmol/L (ref 101–111)
CO2: 20 mmol/L — AB (ref 22–32)
Calcium: 9.1 mg/dL (ref 8.9–10.3)
Creatinine, Ser: 1.57 mg/dL — ABNORMAL HIGH (ref 0.44–1.00)
GFR calc non Af Amer: 33 mL/min — ABNORMAL LOW (ref >60)
GFR, EST AFRICAN AMERICAN: 38 mL/min — AB (ref >60)
GLUCOSE: 108 mg/dL — AB (ref 65–99)
Potassium: 3.7 mmol/L (ref 3.5–5.1)
Sodium: 136 mmol/L (ref 135–145)

## 2016-09-24 LAB — LIPID PANEL
Cholesterol: 165 mg/dL (ref 0–200)
HDL: 62 mg/dL (ref >40)
LDL CALC: 78 mg/dL (ref 0–99)
TRIGLYCERIDES: 126 mg/dL (ref <150)
Total CHOL/HDL Ratio: 2.7 RATIO
VLDL: 25 mg/dL (ref 0–40)

## 2016-09-24 LAB — TROPONIN I
Troponin I: <0.03 ng/mL (ref <0.03)
Troponin I: <0.03 ng/mL (ref <0.03)
Troponin I: <0.03 ng/mL (ref <0.03)

## 2016-09-24 LAB — PROTIME-INR
INR: 0.97
PROTHROMBIN TIME: 12.9 s (ref 11.4–15.2)

## 2016-09-24 LAB — TSH: TSH: 1.606 u[IU]/mL (ref 0.350–4.500)

## 2016-09-24 LAB — MAGNESIUM: Magnesium: 1.9 mg/dL (ref 1.7–2.4)

## 2016-09-24 MED ORDER — ASPIRIN EC 81 MG PO TBEC
81.0000 mg | DELAYED_RELEASE_TABLET | Freq: Every day | ORAL | Status: DC
  Administered 2016-09-24 – 2016-09-27 (×4): 81 mg via ORAL
  Filled 2016-09-24 (×4): qty 1

## 2016-09-24 MED ORDER — HYDRALAZINE HCL 20 MG/ML IJ SOLN
5.0000 mg | Freq: Three times a day (TID) | INTRAMUSCULAR | Status: DC | PRN
  Administered 2016-09-24 – 2016-09-27 (×5): 5 mg via INTRAVENOUS
  Filled 2016-09-24 (×5): qty 1

## 2016-09-24 MED ORDER — MORPHINE SULFATE (PF) 2 MG/ML IV SOLN: 1.0000 mg | INTRAVENOUS | Status: DC | PRN

## 2016-09-24 MED ORDER — CARVEDILOL 12.5 MG PO TABS
12.5000 mg | ORAL_TABLET | Freq: Two times a day (BID) | ORAL | Status: DC
  Administered 2016-09-24 – 2016-09-25 (×2): 12.5 mg via ORAL
  Filled 2016-09-24: qty 1

## 2016-09-24 MED ORDER — NITROGLYCERIN 0.4 MG SL SUBL: 0.4000 mg | SUBLINGUAL_TABLET | SUBLINGUAL | Status: DC | PRN

## 2016-09-24 MED ORDER — ONDANSETRON HCL 4 MG/2ML IJ SOLN: 4.0000 mg | Freq: Four times a day (QID) | INTRAMUSCULAR | Status: DC | PRN

## 2016-09-24 MED ORDER — PANTOPRAZOLE SODIUM 40 MG PO TBEC
40.0000 mg | DELAYED_RELEASE_TABLET | Freq: Two times a day (BID) | ORAL | Status: DC
  Administered 2016-09-24 – 2016-09-27 (×7): 40 mg via ORAL
  Filled 2016-09-24 (×7): qty 1

## 2016-09-24 MED ORDER — METOPROLOL TARTRATE 50 MG PO TABS
50.0000 mg | ORAL_TABLET | Freq: Two times a day (BID) | ORAL | Status: DC
  Administered 2016-09-24: 50 mg via ORAL
  Filled 2016-09-24: qty 1

## 2016-09-24 MED ORDER — FUROSEMIDE 10 MG/ML IJ SOLN
40.0000 mg | Freq: Once | INTRAMUSCULAR | Status: AC
  Administered 2016-09-24: 40 mg via INTRAVENOUS
  Filled 2016-09-24: qty 4

## 2016-09-24 MED ORDER — CLONIDINE HCL 0.1 MG PO TABS
0.1000 mg | ORAL_TABLET | Freq: Two times a day (BID) | ORAL | Status: DC
  Administered 2016-09-24 (×2): 0.1 mg via ORAL
  Filled 2016-09-24 (×2): qty 1

## 2016-09-24 MED ORDER — ATORVASTATIN CALCIUM 20 MG PO TABS
40.0000 mg | ORAL_TABLET | Freq: Every day | ORAL | Status: DC
  Administered 2016-09-24 – 2016-09-26 (×3): 40 mg via ORAL
  Filled 2016-09-24 (×3): qty 2

## 2016-09-24 MED ORDER — ACETAMINOPHEN 325 MG PO TABS: 650.0000 mg | ORAL_TABLET | ORAL | Status: DC | PRN

## 2016-09-24 MED ORDER — IRBESARTAN 150 MG PO TABS
300.0000 mg | ORAL_TABLET | Freq: Every day | ORAL | Status: DC
  Administered 2016-09-24 – 2016-09-27 (×4): 300 mg via ORAL
  Filled 2016-09-24 (×4): qty 2

## 2016-09-24 MED ORDER — BUTALBITAL-APAP-CAFFEINE 50-325-40 MG PO TABS
2.0000 | ORAL_TABLET | ORAL | Status: DC | PRN
  Administered 2016-09-24 – 2016-09-27 (×9): 2 via ORAL
  Filled 2016-09-24 (×10): qty 2

## 2016-09-24 MED ORDER — BUPROPION HCL 100 MG PO TABS
150.0000 mg | ORAL_TABLET | Freq: Every day | ORAL | Status: DC
  Administered 2016-09-24 – 2016-09-27 (×4): 150 mg via ORAL
  Filled 2016-09-24 (×4): qty 2

## 2016-09-24 MED ORDER — ZOLPIDEM TARTRATE 5 MG PO TABS
5.0000 mg | ORAL_TABLET | Freq: Every evening | ORAL | Status: DC | PRN
  Administered 2016-09-24 – 2016-09-25 (×3): 5 mg via ORAL
  Filled 2016-09-24 (×3): qty 1

## 2016-09-24 MED ORDER — AMLODIPINE BESYLATE 5 MG PO TABS: 2.5000 mg | ORAL_TABLET | Freq: Every day | ORAL | Status: DC

## 2016-09-24 MED ORDER — ACETAMINOPHEN 325 MG PO TABS
650.0000 mg | ORAL_TABLET | ORAL | Status: DC | PRN
  Administered 2016-09-24: 650 mg via ORAL
  Filled 2016-09-24: qty 2

## 2016-09-24 MED ORDER — SODIUM CHLORIDE 0.9% FLUSH
3.0000 mL | Freq: Two times a day (BID) | INTRAVENOUS | Status: DC
  Administered 2016-09-24 – 2016-09-26 (×3): 3 mL via INTRAVENOUS

## 2016-09-24 MED ORDER — FERROUS SULFATE 325 (65 FE) MG PO TABS
325.0000 mg | ORAL_TABLET | Freq: Three times a day (TID) | ORAL | Status: DC
  Administered 2016-09-24 – 2016-09-27 (×11): 325 mg via ORAL
  Filled 2016-09-24 (×13): qty 1

## 2016-09-24 MED ORDER — LORAZEPAM 1 MG PO TABS: 1.0000 mg | ORAL_TABLET | Freq: Two times a day (BID) | ORAL | Status: DC | PRN

## 2016-09-24 MED ORDER — FAMOTIDINE 20 MG PO TABS
10.0000 mg | ORAL_TABLET | Freq: Every day | ORAL | Status: DC
  Administered 2016-09-24 – 2016-09-27 (×4): 10 mg via ORAL
  Filled 2016-09-24 (×4): qty 1

## 2016-09-24 MED ORDER — ENOXAPARIN SODIUM 30 MG/0.3ML ~~LOC~~ SOLN
30.0000 mg | SUBCUTANEOUS | Status: DC
  Administered 2016-09-24 – 2016-09-26 (×3): 30 mg via SUBCUTANEOUS
  Filled 2016-09-24 (×3): qty 0.3

## 2016-09-24 MED ORDER — SODIUM CHLORIDE 0.9% FLUSH
3.0000 mL | INTRAVENOUS | Status: DC | PRN
  Administered 2016-09-26: 3 mL via INTRAVENOUS
  Filled 2016-09-24: qty 3

## 2016-09-24 NOTE — Consult Note (Signed)
Cardiology Consult    Patient ID: Amy [name] MRN: [id number], DOB/AGE: Apr 07, 1951   Admit date: 09/23/2016 Date of Consult: 09/24/2016  Primary Physician: [name], MD Primary Cardiologist: New - seen by Johnny Bridge, MD  Requesting Provider: Chauncey Cruel. Patel  Patient Profile    Amy [name] is a 66 y.o. female with a history of anemia, gastritis, HTN, GERD, hyponatremia, CKD III, anxiety, and thyroid nodules, who is being seen today for the evaluation of chest pain and hypertensive urgency at the request of Dr. Posey Pronto.  Past Medical History   Past Medical History:  Diagnosis Date  . Anemia    a. 08/2016 Guaiac + stool. EGD w/ gastritis.  . Anxiety   . CKD (chronic kidney disease), stage III   . Colon polyp    a. 08/2016 Colonoscopy.  . Gastritis    a. 08/2016 EGD: Gastritis-->PPI.  [name] GERD (gastroesophageal reflux disease)   . History of chicken pox   . History of measles as a child   . Hypertension   . Hyponatremia    a. 08/2016 in setting of HCTZ Rx.  . Multiple thyroid nodules     Past Surgical History:  Procedure Laterality Date  . ABDOMINAL HYSTERECTOMY  2003   due to heavy periods and clotting during menses  . BREAST LUMPECTOMY Left 1990  . CHOLECYSTECTOMY  2010  . COLONOSCOPY WITH PROPOFOL N/A 09/07/2016   Procedure: COLONOSCOPY WITH PROPOFOL;  Surgeon: Lucilla Lame, MD;  Location: [facility] ENDOSCOPY;  Service: Endoscopy;  Laterality: N/A;  . DILATION AND CURETTAGE OF UTERUS  1985  . ESOPHAGOGASTRODUODENOSCOPY (EGD) WITH PROPOFOL N/A 09/07/2016   Procedure: ESOPHAGOGASTRODUODENOSCOPY (EGD) WITH PROPOFOL;  Surgeon: Lucilla Lame, MD;  Location: [facility] ENDOSCOPY;  Service: Endoscopy;  Laterality: N/A;  . GALLBLADDER SURGERY  2012  . KNEE SURGERY     Dr. Tamala Julian  . MRI, Lumbar spine  07/23/2009   Multilevel annular bulge and disc protrusion at L2-L3, L3-L4, L4-L5, and L5-S1  . TONSILLECTOMY    . TONSILLECTOMY AND ADENOIDECTOMY  1970  . UPPER GI ENDOSCOPY  03/02/2006   H.  Pylori negative; Dr. Allen Norris     Allergies  Allergies  Allergen Reactions  . Cephalosporins   . Clarithromycin   . Lunesta  [Eszopiclone]     Heart racing  . Penicillin V     Has patient had a PCN reaction causing immediate rash, facial/tongue/throat swelling, SOB or lightheadedness with hypotension: Yes Has patient had a PCN reaction causing severe rash involving mucus membranes or skin necrosis: No Has patient had a PCN reaction that required hospitalization No Has patient had a PCN reaction occurring within the last 10 years: No If all of the above answers are "NO", then may proceed with Cephalosporin use.  . Sulfa Antibiotics     History of Present Illness    66 y/o ? with a h/o anxiety, HTN, and thyroid nodules.  Over the past 6+ months, she has experienced intermittent substernal chest discomfort, which became acutely worse earlier this month after eating a barbecue sandwich.  The following day, due to persistent discomfort, she presented to [facility].  She was admitted and ruled out.  She was found to be anemic (H/H in 8/23 range) w/ guaiac + stool.  She was seen by GI and underwent EGD showing gastritis and colonoscopy showing a polyp, which was biopsied.  She was placed on PPI rx.  She was also noted to be hyponatremic and HCTZ was d/c'd.  She was  placed on fluid restriction and advised to increase salt intake.   blocker was started for HTN.  Following discharge, she continued to note constant substernal chest discomfort.  She doesn't think the addition of protonix has helped @ all.  She has also noted some reduction in exercise tolerance, especially when walking up stairs.  She saw her PCP on 5/22 and was placed on amlodipine 2.5 mg daily.  The following morning, she noted increasing ankle edema.  On 5/24, she felt poorly after running some errands.  She was lightheaded and felt flushed.  She also felt like heart has been racing.  She had a friend check her BP and her systolic was > 794.  After speaking with a friend that is an RN @ [facility], she presented to the ED.  Here, BP was 190/87.  CXR showed vascular congestion.  LE u/s was neg for DVT.  H/H stable from prior admission.  She was admitted for further eval. ECG and troponins normal.  She continues to have moderate substernal and epigastric c/p.  Inpatient Medications    . amLODipine  2.5 mg Oral Daily  . aspirin EC  81 mg Oral Daily  . atorvastatin  40 mg Oral q1800  . buPROPion  150 mg Oral Daily  . enoxaparin (LOVENOX) injection  30 mg Subcutaneous Q24H  . famotidine  10 mg Oral Daily  . ferrous sulfate  325 mg Oral TID WC  . irbesartan  300 mg Oral Daily  . metoprolol tartrate  50 mg Oral BID  . pantoprazole  40 mg Oral BID AC    Family History    Family History  Problem Relation Age of Onset  . Pancreatic cancer Father   . Congestive Heart Failure Maternal Grandmother   . Cancer Paternal Grandfather   . Heart disease Maternal Uncle     Social History    Social History   Social History  . Marital status: Married    Spouse name: N/A  . Number of children: 1  . Years of education: N/A   Occupational History  . Retired    Social History Main Topics  . Smoking status: Never Smoker  . Smokeless tobacco: Never Used  . Alcohol use 0.0 oz/week     Comment: few glasses a wine/wk.  . Drug use: No  . Sexual activity: Yes   Other Topics Concern  . Not on file   Social History Narrative   Lives in [place] with husband.  Does not routinely exercise.     Review of Systems    General:  No chills, fever, night sweats or weight changes.  Cardiovascular:  +++ chest and epigastric pain, +++ dyspnea on exertion, +++ ankle edema, no orthopnea, palpitations, paroxysmal nocturnal dyspnea. Dermatological: No rash, lesions/masses Respiratory: No cough, +++ dyspnea Urologic: No hematuria, dysuria Abdominal:   No nausea, vomiting, diarrhea, bright red blood per rectum, melena, or hematemesis Neurologic:  No  visual changes, wkns, changes in mental status. All other systems reviewed and are otherwise negative except as noted above.  Physical Exam    Blood pressure (!) 181/72, pulse 79, temperature 99 F (37.2 C), temperature source Oral, resp. rate 15, height 5\' 4"  (1.626 m), weight 116 lb 4.8 oz (52.8 kg), SpO2 97 %.  General: Pleasant, NAD Psych: Normal affect. Neuro: Alert and oriented X 3. Moves all extremities spontaneously. HEENT: Normal  Neck: Supple without bruits or JVD. Lungs:  Resp regular and unlabored, CTA. Heart: RRR no s3, s4, or murmurs.  Abdomen: Soft, non-tender, non-distended, BS + x 4.  Extremities: No clubbing, cyanosis or edema. DP/PT/Radials 2+ and equal bilaterally.  Labs     Recent Labs  09/23/16 1826 09/24/16 0103 09/24/16 0550  TROPONINI <0.03 <0.03 <0.03   Lab Results  Component Value Date   WBC 9.6 09/24/2016   HGB 8.5 (L) 09/24/2016   HCT 24.2 (L) 09/24/2016   MCV 92.8 09/24/2016   PLT 306 09/24/2016    Recent Labs Lab 09/21/16 1521  09/24/16 0550  NA 139  < > 136  K 4.1  < > 3.7  CL 103  < > 106  CO2 20  < > 20*  BUN 21  < > 25*  CREATININE 1.72*  < > 1.57*  CALCIUM 9.0  < > 9.1  PROT 5.9*  --   --   BILITOT <0.2  --   --   ALKPHOS 147*  --   --   ALT 25  --   --   AST 28  --   --   GLUCOSE 96  < > 108*  < > = values in this interval not displayed. Lab Results  Component Value Date   CHOL 165 09/24/2016   HDL 62 09/24/2016   LDLCALC 78 09/24/2016   TRIG 126 09/24/2016    Radiology Studies    Dg Chest 2 View  Result Date: 09/23/2016 CLINICAL DATA:  Chest pain for 2-3 days.  Lower extremity swelling EXAM: CHEST  2 VIEW COMPARISON:  09/05/2016 FINDINGS: Pulmonary vascular congestion and small pleural effusions. No air bronchogram or generalized Kerley lines. Stable normal heart size. Mild aortic tortuosity. No acute osseous finding.  Remote left humeral neck fracture. IMPRESSION: Pulmonary vascular congestion and small pleural  effusions, findings of mild CHF. Electronically Signed   By: Monte Fantasia M.D.   On: 09/23/2016 18:48   Dg Chest 2 View  Result Date: 09/05/2016 CLINICAL DATA:  Chest pain. EXAM: CHEST  2 VIEW COMPARISON:  None. FINDINGS: Upper limits normal heart size noted. There is no evidence of focal airspace disease, pulmonary edema, suspicious pulmonary nodule/mass, pleural effusion, or pneumothorax. No acute bony abnormalities are identified. IMPRESSION: No active cardiopulmonary disease. Electronically Signed   By: Margarette Canada M.D.   On: 09/05/2016 17:32   US Venous Img Lower Bilateral  Result Date: 09/24/2016 CLINICAL DATA:  Acute onset of bilateral leg swelling. Initial encounter. EXAM: BILATERAL LOWER EXTREMITY VENOUS DOPPLER ULTRASOUND TECHNIQUE: Gray-scale sonography with graded compression, as well as color Doppler and duplex ultrasound were performed to evaluate the lower extremity deep venous systems from the level of the common femoral vein and including the common femoral, femoral, profunda femoral, popliteal and calf veins including the posterior tibial, peroneal and gastrocnemius veins when visible. The superficial great saphenous vein was also interrogated. Spectral Doppler was utilized to evaluate flow at rest and with distal augmentation maneuvers in the common femoral, femoral and popliteal veins. COMPARISON:  None. FINDINGS: RIGHT LOWER EXTREMITY Common Femoral Vein: No evidence of thrombus. Normal compressibility, respiratory phasicity and response to augmentation. Saphenofemoral Junction: No evidence of thrombus. Normal compressibility and flow on color Doppler imaging. Profunda Femoral Vein: No evidence of thrombus. Normal compressibility and flow on color Doppler imaging. Femoral Vein: No evidence of thrombus. Normal compressibility, respiratory phasicity and response to augmentation. Popliteal Vein: No evidence of thrombus. Normal compressibility, respiratory phasicity and response to  augmentation. Calf Veins: No evidence of thrombus. Normal compressibility and flow on color Doppler imaging. Superficial Great Saphenous  Vein: No evidence of thrombus. Normal compressibility and flow on color Doppler imaging. Venous Reflux:  None. Other Findings:  None. LEFT LOWER EXTREMITY Common Femoral Vein: No evidence of thrombus. Normal compressibility, respiratory phasicity and response to augmentation. Saphenofemoral Junction: No evidence of thrombus. Normal compressibility and flow on color Doppler imaging. Profunda Femoral Vein: No evidence of thrombus. Normal compressibility and flow on color Doppler imaging. Femoral Vein: No evidence of thrombus. Normal compressibility, respiratory phasicity and response to augmentation. Popliteal Vein: No evidence of thrombus. Normal compressibility, respiratory phasicity and response to augmentation. Calf Veins: No evidence of thrombus. Normal compressibility and flow on color Doppler imaging. Superficial Great Saphenous Vein: No evidence of thrombus. Normal compressibility and flow on color Doppler imaging. Venous Reflux:  None. Other Findings:  None. IMPRESSION: No evidence of DVT within either lower extremity. Electronically Signed   By: Garald Balding M.D.   On: 09/24/2016 00:15    ECG & Cardiac Imaging    RSR, 93, LAE, no acute st/t changes.  Assessment & Plan    1.  Midsternal Chest and Epigastric Pain:  Pt with a several month h/o intermittent midsternal and epigastric chest discomfort, found earlier this month to have gastritis.  She has continued to have constant symptoms since d/c on 5/10, and does not think that PPI therapy has helped at all.  Over the past 2 wks, she has also noted some reduction in exercise tolerance with dyspnea and fatigue when walking up stairs in her home.  She felt poorly (lightheaded, flushed) on 5/24 and noted elevated blood pressures @ home, thus she presented to the [facility] ED.  Here, ECG and troponin was normal.  BNP mildly  elevated in the setting of hypertensive urgency (190/87) and cxr w/ vascular congestion.  She continues to have moderate chest and epigastric pain.  Despite prolonged symptoms, troponins remain normal.  Suspect symptoms primarily GI in nature.  Cont BID PPI and H2 blocker therapy.  Consider a dose of GI cocktail. With marked HTN, not unreasonable to consider outpt stress testing.  2.  Hypertensive Urgency:  BP markedly elevated yesterday with associated flushing and lightheadedness.  She isn't sure as to how bp runs @ home usually.  HCTZ recently d/c'd in early May in the setting of hyponatremia, and  blocker added.  Amlodipine added on Tuesday and it appears that this has resulted in worsening ankle edema.  BP still elevated this am @ 181/72.  Will give one dose of IV lasix in setting of elevated bnp and vascular congestion.  Cont max dose irbesartan.  Switch  blocker from metoprolol to carvedilol. Will also add low dose clonidine.  If BP better this afternoon, she can likely be discharged.  3.  Lower ext edema:  In setting of recent discontinuation of hctz, increased salt intake, and addition of amlodipine.  No edema on exam this am.  D/c amlodipine.  4.  Palpitations:  Pt reports feeling as though heart racing, though she is in sinus rhythm.  Occas pacs noted.  Cont  blocker.  5.  Hyponatremia:  Stable off of hctz.  6.  Gastritis:  See #1.  7.  Anemia:  Stable.  Likely contributing to dyspnea.  Defer to IM.  She said that she tried to take an iron supplement but it caused GI distress.  Recent iron studies were nl.  8.  CKD III:  Stable.  Signed, Murray Hodgkins, NP 09/24/2016, 8:32 AM

## 2016-09-24 NOTE — Progress Notes (Signed)
[place] at [facility]                                                                                                                                                                                  Patient Demographics   Amy [name], is a 66 y.o. female, DOB - May 29, 1950, XBD:[id number]  Admit date - 09/23/2016   Admitting Physician Harvie Bridge, DO  Outpatient Primary MD for the patient is Fisher, Kirstie Peri, MD   LOS - 0  Subjective: Patient was admitted with chest pain and elevated blood pressure recent hospitalization Had right upper quadrant abdominal pain and hyponatremia. Patient currently complains of headache and states that felt like pressure in her chest   Review of Systems:   CONSTITUTIONAL: No documented fever. No fatigue, weakness. No weight gain, no weight loss.  EYES: No blurry or double vision.  ENT: No tinnitus. No postnasal drip. No redness of the oropharynx.  RESPIRATORY: No cough, no wheeze, no hemoptysis. No dyspnea.  CARDIOVASCULAR: No chest pain. No orthopnea. No palpitations. No syncope. + chest pressure GASTROINTESTINAL: No nausea, no vomiting or diarrhea. No abdominal pain. No melena or hematochezia.  GENITOURINARY: No dysuria or hematuria.  ENDOCRINE: No polyuria or nocturia. No heat or cold intolerance.  HEMATOLOGY: No anemia. No bruising. No bleeding.  INTEGUMENTARY: No rashes. No lesions.  MUSCULOSKELETAL: No arthritis. No swelling. No gout.  NEUROLOGIC: No numbness, tingling, or ataxia. No seizure-type activity.  PSYCHIATRIC: No anxiety. No insomnia. No ADD.    Vitals:   Vitals:   09/24/16 0430 09/24/16 0604 09/24/16 0917 09/24/16 1111  BP: (!) 188/74 (!) 181/72 (!) 195/77 (!) 156/73  Pulse: 77 79 85 76  Resp: 15  20 20   Temp: 99 F (37.2 C)  98.8 F (37.1 C) 99.9 F (37.7 C)  TempSrc: Oral  Oral Oral  SpO2: 97% 97% 97% 97%  Weight: 116 lb 4.8 oz (52.8 kg)     Height:        Wt Readings from Last 3  Encounters:  09/24/16 116 lb 4.8 oz (52.8 kg)  09/21/16 119 lb (54 kg)  09/07/16 107 lb (48.5 kg)     Intake/Output Summary (Last 24 hours) at 09/24/16 1317 Last data filed at 09/24/16 0334  Gross per 24 hour  Intake                0 ml  Output              300 ml  Net             -300 ml    Physical Exam:   GENERAL: Pleasant-appearing in no apparent distress.  HEAD, EYES, EARS, NOSE AND THROAT: Atraumatic, normocephalic. Extraocular muscles are intact. Pupils equal and reactive to light. Sclerae anicteric. No conjunctival injection. No oro-pharyngeal erythema.  NECK: Supple. There is no jugular venous distention. No bruits, no lymphadenopathy, no thyromegaly.  HEART: Regular rate and rhythm,. No murmurs, no rubs, no clicks.  LUNGS: + occasion crackles ABDOMEN: Soft, flat, nontender, nondistended. Has good bowel sounds. No hepatosplenomegaly appreciated.  EXTREMITIES: No evidence of any cyanosis, clubbing, or peripheral edema.  +2 pedal and radial pulses bilaterally.  NEUROLOGIC: The patient is alert, awake, and oriented x3 with no focal motor or sensory deficits appreciated bilaterally.  SKIN: Moist and warm with no rashes appreciated.  Psych: Not anxious, depressed LN: No inguinal LN enlargement    Antibiotics   Anti-infectives    None      Medications   Scheduled Meds: . aspirin EC  81 mg Oral Daily  . atorvastatin  40 mg Oral q1800  . buPROPion  150 mg Oral Daily  . carvedilol  12.5 mg Oral BID WC  . cloNIDine  0.1 mg Oral BID  . enoxaparin (LOVENOX) injection  30 mg Subcutaneous Q24H  . famotidine  10 mg Oral Daily  . ferrous sulfate  325 mg Oral TID WC  . irbesartan  300 mg Oral Daily  . pantoprazole  40 mg Oral BID AC   Continuous Infusions: PRN Meds:.acetaminophen, butalbital-acetaminophen-caffeine, hydrALAZINE, LORazepam, morphine injection, nitroGLYCERIN, ondansetron (ZOFRAN) IV, zolpidem   Data Review:   Micro Results No results found for this or  any previous visit (from the past 240 hour(s)).  Radiology Reports Dg Chest 2 View  Result Date: 09/23/2016 CLINICAL DATA:  Chest pain for 2-3 days.  Lower extremity swelling EXAM: CHEST  2 VIEW COMPARISON:  09/05/2016 FINDINGS: Pulmonary vascular congestion and small pleural effusions. No air bronchogram or generalized Kerley lines. Stable normal heart size. Mild aortic tortuosity. No acute osseous finding.  Remote left humeral neck fracture. IMPRESSION: Pulmonary vascular congestion and small pleural effusions, findings of mild CHF. Electronically Signed   By: Monte Fantasia M.D.   On: 09/23/2016 18:48   Dg Chest 2 View  Result Date: 09/05/2016 CLINICAL DATA:  Chest pain. EXAM: CHEST  2 VIEW COMPARISON:  None. FINDINGS: Upper limits normal heart size noted. There is no evidence of focal airspace disease, pulmonary edema, suspicious pulmonary nodule/mass, pleural effusion, or pneumothorax. No acute bony abnormalities are identified. IMPRESSION: No active cardiopulmonary disease. Electronically Signed   By: Margarette Canada M.D.   On: 09/05/2016 17:32   US Venous Img Lower Bilateral  Result Date: 09/24/2016 CLINICAL DATA:  Acute onset of bilateral leg swelling. Initial encounter. EXAM: BILATERAL LOWER EXTREMITY VENOUS DOPPLER ULTRASOUND TECHNIQUE: Gray-scale sonography with graded compression, as well as color Doppler and duplex ultrasound were performed to evaluate the lower extremity deep venous systems from the level of the common femoral vein and including the common femoral, femoral, profunda femoral, popliteal and calf veins including the posterior tibial, peroneal and gastrocnemius veins when visible. The superficial great saphenous vein was also interrogated. Spectral Doppler was utilized to evaluate flow at rest and with distal augmentation maneuvers in the common femoral, femoral and popliteal veins. COMPARISON:  None. FINDINGS: RIGHT LOWER EXTREMITY Common Femoral Vein: No evidence of thrombus.  Normal compressibility, respiratory phasicity and response to augmentation. Saphenofemoral Junction: No evidence of thrombus. Normal compressibility and flow on color Doppler imaging. Profunda Femoral Vein: No evidence of thrombus. Normal compressibility and flow on color Doppler imaging. Femoral Vein:  No evidence of thrombus. Normal compressibility, respiratory phasicity and response to augmentation. Popliteal Vein: No evidence of thrombus. Normal compressibility, respiratory phasicity and response to augmentation. Calf Veins: No evidence of thrombus. Normal compressibility and flow on color Doppler imaging. Superficial Great Saphenous Vein: No evidence of thrombus. Normal compressibility and flow on color Doppler imaging. Venous Reflux:  None. Other Findings:  None. LEFT LOWER EXTREMITY Common Femoral Vein: No evidence of thrombus. Normal compressibility, respiratory phasicity and response to augmentation. Saphenofemoral Junction: No evidence of thrombus. Normal compressibility and flow on color Doppler imaging. Profunda Femoral Vein: No evidence of thrombus. Normal compressibility and flow on color Doppler imaging. Femoral Vein: No evidence of thrombus. Normal compressibility, respiratory phasicity and response to augmentation. Popliteal Vein: No evidence of thrombus. Normal compressibility, respiratory phasicity and response to augmentation. Calf Veins: No evidence of thrombus. Normal compressibility and flow on color Doppler imaging. Superficial Great Saphenous Vein: No evidence of thrombus. Normal compressibility and flow on color Doppler imaging. Venous Reflux:  None. Other Findings:  None. IMPRESSION: No evidence of DVT within either lower extremity. Electronically Signed   By: Garald Balding M.D.   On: 09/24/2016 00:15     CBC  Recent Labs Lab 09/21/16 1521 09/23/16 1826 09/24/16 0550  WBC 8.2 8.9 9.6  HGB  --  8.4* 8.5*  HCT 23.5* 24.2* 24.2*  PLT 291 278 306  MCV 94 94.7 92.8  MCH 32.3 32.8  32.5  MCHC 34.5 34.7 35.0  RDW 13.4 13.5 13.2    Chemistries   Recent Labs Lab 09/21/16 1521 09/23/16 1826 09/24/16 0103 09/24/16 0550  NA 139 134*  --  136  K 4.1 3.5  --  3.7  CL 103 104  --  106  CO2 20 21*  --  20*  GLUCOSE 96 102*  --  108*  BUN 21 27*  --  25*  CREATININE 1.72* 1.57*  --  1.57*  CALCIUM 9.0 9.0  --  9.1  MG  --   --  1.9  --   AST 28  --   --   --   ALT 25  --   --   --   ALKPHOS 147*  --   --   --   BILITOT <0.2  --   --   --    ------------------------------------------------------------------------------------------------------------------ estimated creatinine clearance is 29 mL/min (A) (by C-G formula based on SCr of 1.57 mg/dL (H)). ------------------------------------------------------------------------------------------------------------------ No results for input(s): HGBA1C in the last 72 hours. ------------------------------------------------------------------------------------------------------------------  Recent Labs  09/24/16 0550  CHOL 165  HDL 62  LDLCALC 78  TRIG 126  CHOLHDL 2.7   ------------------------------------------------------------------------------------------------------------------  Recent Labs  09/24/16 0103  TSH 1.606   ------------------------------------------------------------------------------------------------------------------  Recent Labs  09/21/16 1521  VITAMINB12 827  FOLATE 16.0  TIBC 219*  IRON 44  RETICCTPCT 2.5    Coagulation profile  Recent Labs Lab 09/24/16 0550  INR 0.97    No results for input(s): DDIMER in the last 72 hours.  Cardiac Enzymes  Recent Labs Lab 09/23/16 1826 09/24/16 0103 09/24/16 0550  TROPONINI <0.03 <0.03 <0.03   ------------------------------------------------------------------------------------------------------------------ Invalid input(s): POCBNP    Assessment & Plan   Amy [name] is a 66 y.o. female with a history of anxiety, GERD, HTN, CKD3,  thyroid noduleand deficiency, anemia  now being admitted with:  #. Chest pain,  Possibly related to elevated blood pressure cardiac enzymes negative Await echocardiogram of the heart   #Possible congestive heart failure trial of IV Lasix  #.  Uncontrolled HTN, ? 2/2 anxiety Accelerated patient started on clonidine now and Coreg Use when necessary IV hydralazine   #. anemia -appears to be chronic in nature no indication for transfusion     #. Mild hyponatremia, significantly improved from previous hospitalization -Monitor BMP  #. History of CK D3, baseline -Monitor his BMP  #. History of GERD -Continue Protonix before meals and Pepcid once daily  #. History of depression/anxiety -Continue Wellbutrin, Ativan     Code Status Orders        Start     Ordered   09/24/16 0050  Full code  Continuous     09/24/16 0049    Code Status History    Date Active Date Inactive Code Status Order ID Comments User Context   09/06/2016 12:30 AM 09/09/2016  4:24 PM Full Code [id number]  Lance Coon, MD Inpatient    Advance Directive Documentation     Most Recent Value  Type of Advance Directive  Living will  Pre-existing out of facility DNR order (yellow form or pink MOST form)  -  "MOST" Form in Place?  -           Consults cardiology   DVT Prophylaxis  Lovenox   Lab Results  Component Value Date   PLT 306 09/24/2016     Time Spent in minutes  67min  Greater than 50% of time spent in care coordination and counseling patient regarding the condition and plan of care.   Dustin Flock M.D on 09/24/2016 at 1:17 PM  Between 7am to 6pm - Pager - [phone]  After 6pm go to [email or web address] - password [email or web address] [facility]   Office  [phone]

## 2016-09-24 NOTE — Care Management (Signed)
Placed in observation for chest pain.  Independent in all adls, denies issues accessing medical care, obtaining medications or with transportation.  Current with her PCP.  No discharge needs identified at present by care manager or members of care team

## 2016-09-24 NOTE — Care Management (Signed)
Placed in observation. No discharge needs identified by members of the care team nor patient.  Independent in all adls, denies issues accessing medical care, obtaining medications or with transportation.  Current with her PCP.

## 2016-09-24 NOTE — Care Management Obs Status (Signed)
[facility] NOTIFICATION   Patient Details  Name: Amy [name] MRN: [id number] Date of Birth: 1951/01/10   Medicare Observation Status Notification Given:  Yes    Katrina Stack, RN 09/24/2016, 10:59 AM

## 2016-09-25 ENCOUNTER — Observation Stay: Payer: Medicare Other

## 2016-09-25 DIAGNOSIS — M7989 Other specified soft tissue disorders: Secondary | ICD-10-CM | POA: Diagnosis not present

## 2016-09-25 DIAGNOSIS — I10 Essential (primary) hypertension: Secondary | ICD-10-CM | POA: Diagnosis not present

## 2016-09-25 DIAGNOSIS — N183 Chronic kidney disease, stage 3 (moderate): Secondary | ICD-10-CM | POA: Diagnosis not present

## 2016-09-25 DIAGNOSIS — R079 Chest pain, unspecified: Secondary | ICD-10-CM | POA: Diagnosis not present

## 2016-09-25 DIAGNOSIS — K219 Gastro-esophageal reflux disease without esophagitis: Secondary | ICD-10-CM | POA: Diagnosis not present

## 2016-09-25 LAB — ECHOCARDIOGRAM COMPLETE
Area-P 1/2: 6.11 cm2
EERAT: 20.57
EWDT: 123 ms
FS: 34 % (ref 28–44)
Height: 64 in
IVS/LV PW RATIO, ED: 0.93
LA ID, A-P, ES: 40 mm
LA diam end sys: 40 mm
LA diam index: 2.6 cm/m2
LA vol A4C: 53.2 ml
LA vol index: 37 mL/m2
LAVOL: 57 mL
LV E/e' medial: 20.57
LV TDI E'LATERAL: 5.98
LV e' LATERAL: 5.98 cm/s
LVEEAVG: 20.57
MV Dec: 123
MV Peak grad: 6 mmHg
MV pk A vel: 150 m/s
MVPKEVEL: 123 m/s
MVSPHT: 36 ms
PW: 11.8 mm — AB (ref 0.6–1.1)
RV LATERAL S' VELOCITY: 15.4 cm/s
RV TAPSE: 30 mm
TDI e' medial: 5.55
WEIGHTICAEL: 1860.8 [oz_av]

## 2016-09-25 LAB — URINALYSIS, COMPLETE (UACMP) WITH MICROSCOPIC
BILIRUBIN URINE: NEGATIVE
Bacteria, UA: NONE SEEN
Glucose, UA: NEGATIVE mg/dL
Hgb urine dipstick: NEGATIVE
KETONES UR: NEGATIVE mg/dL
LEUKOCYTES UA: NEGATIVE
Nitrite: NEGATIVE
PROTEIN: 100 mg/dL — AB
Specific Gravity, Urine: 1.02 (ref 1.005–1.030)
pH: 5 (ref 5.0–8.0)

## 2016-09-25 LAB — BASIC METABOLIC PANEL
ANION GAP: 9 (ref 5–15)
BUN: 23 mg/dL — AB (ref 6–20)
CHLORIDE: 106 mmol/L (ref 101–111)
CO2: 22 mmol/L (ref 22–32)
Calcium: 8.7 mg/dL — ABNORMAL LOW (ref 8.9–10.3)
Creatinine, Ser: 1.49 mg/dL — ABNORMAL HIGH (ref 0.44–1.00)
GFR calc Af Amer: 41 mL/min — ABNORMAL LOW (ref >60)
GFR calc non Af Amer: 35 mL/min — ABNORMAL LOW (ref >60)
GLUCOSE: 95 mg/dL (ref 65–99)
POTASSIUM: 3.3 mmol/L — AB (ref 3.5–5.1)
Sodium: 137 mmol/L (ref 135–145)

## 2016-09-25 MED ORDER — POTASSIUM CHLORIDE 20 MEQ PO PACK
20.0000 meq | PACK | Freq: Once | ORAL | Status: AC
  Administered 2016-09-25: 20 meq via ORAL
  Filled 2016-09-25: qty 1

## 2016-09-25 MED ORDER — FUROSEMIDE 20 MG PO TABS
20.0000 mg | ORAL_TABLET | Freq: Two times a day (BID) | ORAL | Status: DC
  Administered 2016-09-25 – 2016-09-26 (×2): 20 mg via ORAL
  Filled 2016-09-25 (×2): qty 1

## 2016-09-25 MED ORDER — LEVOFLOXACIN 500 MG PO TABS
250.0000 mg | ORAL_TABLET | Freq: Every day | ORAL | Status: DC
  Administered 2016-09-25 – 2016-09-27 (×3): 250 mg via ORAL
  Filled 2016-09-25 (×3): qty 1

## 2016-09-25 MED ORDER — CARVEDILOL 25 MG PO TABS
25.0000 mg | ORAL_TABLET | Freq: Two times a day (BID) | ORAL | Status: DC
  Administered 2016-09-25 – 2016-09-27 (×4): 25 mg via ORAL
  Filled 2016-09-25 (×4): qty 1

## 2016-09-25 MED ORDER — CLONIDINE HCL 0.1 MG PO TABS
0.2000 mg | ORAL_TABLET | Freq: Two times a day (BID) | ORAL | Status: DC
  Administered 2016-09-25 (×2): 0.2 mg via ORAL
  Filled 2016-09-25 (×2): qty 2

## 2016-09-25 NOTE — Progress Notes (Addendum)
[facility]                                                                                                                                                                                  Patient Demographics   Amy [name], is a 66 y.o. female, DOB - Sep 07, 1950, PFX:[id number]  Admit date - 09/23/2016   Admitting Physician Harvie Bridge, DO  Outpatient Primary MD for the patient is Fisher, Kirstie Peri, MD   LOS - 0  Subjective: Patient complains of low-grade fever yesterday and cough.still has pressure like feeling in her chestor palpitations Complains of cough  Review of Systems:   CONSTITUTIONAL: No documented fever. No fatigue, weakness. No weight gain, no weight loss.  EYES: No blurry or double vision.  ENT: No tinnitus. No postnasal drip. No redness of the oropharynx.  RESPIRATORY:positive cough, no wheeze, no hemoptysis. No dyspnea.  CARDIOVASCULAR: No chest pain. No orthopnea. No palpitations. No syncope. + chest pressure GASTROINTESTINAL: No nausea, no vomiting or diarrhea. No abdominal pain. No melena or hematochezia.  GENITOURINARY: No dysuria or hematuria.  ENDOCRINE: No polyuria or nocturia. No heat or cold intolerance.  HEMATOLOGY: No anemia. No bruising. No bleeding.  INTEGUMENTARY: No rashes. No lesions.  MUSCULOSKELETAL: No arthritis. No swelling. No gout.  NEUROLOGIC: No numbness, tingling, or ataxia. No seizure-type activity.  PSYCHIATRIC: No anxiety. No insomnia. No ADD.    Vitals:   Vitals:   09/25/16 0412 09/25/16 0514 09/25/16 0800 09/25/16 1214  BP:  (!) 180/68 (!) 184/70 (!) 140/58  Pulse:  91 98 72  Resp:    14  Temp:   98.3 F (36.8 C) 97.8 F (36.6 C)  TempSrc:   Oral Oral  SpO2:   96% 98%  Weight: 113 lb 6.4 oz (51.4 kg)     Height:        Wt Readings from Last 3 Encounters:  09/25/16 113 lb 6.4 oz (51.4 kg)  09/21/16 119 lb (54 kg)  09/07/16 107 lb (48.5 kg)     Intake/Output Summary (Last 24  hours) at 09/25/16 1431 Last data filed at 09/25/16 1300  Gross per 24 hour  Intake              230 ml  Output              200 ml  Net               30 ml    Physical Exam:   GENERAL: Pleasant-appearing in no apparent distress.  HEAD, EYES, EARS, NOSE AND THROAT: Atraumatic, normocephalic. Extraocular muscles are intact. Pupils equal and reactive to light. Sclerae anicteric.  No conjunctival injection. No oro-pharyngeal erythema.  NECK: Supple. There is no jugular venous distention. No bruits, no lymphadenopathy, no thyromegaly.  HEART: Regular rate and rhythm,. No murmurs, no rubs, no clicks.  LUNGS: + occasion crackles ABDOMEN: Soft, flat, nontender, nondistended. Has good bowel sounds. No hepatosplenomegaly appreciated.  EXTREMITIES: No evidence of any cyanosis, clubbing, or peripheral edema.  +2 pedal and radial pulses bilaterally.  NEUROLOGIC: The patient is alert, awake, and oriented x3 with no focal motor or sensory deficits appreciated bilaterally.  SKIN: Moist and warm with no rashes appreciated.  Psych: Not anxious, depressed LN: No inguinal LN enlargement    Antibiotics   Anti-infectives    Start     Dose/Rate Route Frequency Ordered Stop   09/25/16 1000  levofloxacin (LEVAQUIN) tablet 250 mg     250 mg Oral Daily 09/25/16 0918        Medications   Scheduled Meds: . aspirin EC  81 mg Oral Daily  . atorvastatin  40 mg Oral q1800  . buPROPion  150 mg Oral Daily  . carvedilol  25 mg Oral BID WC  . cloNIDine  0.2 mg Oral BID  . enoxaparin (LOVENOX) injection  30 mg Subcutaneous Q24H  . famotidine  10 mg Oral Daily  . ferrous sulfate  325 mg Oral TID WC  . irbesartan  300 mg Oral Daily  . levofloxacin  250 mg Oral Daily  . pantoprazole  40 mg Oral BID AC  . sodium chloride flush  3 mL Intravenous Q12H   Continuous Infusions: PRN Meds:.acetaminophen, butalbital-acetaminophen-caffeine, hydrALAZINE, LORazepam, morphine injection, nitroGLYCERIN, ondansetron  (ZOFRAN) IV, sodium chloride flush, zolpidem   Data Review:   Micro Results No results found for this or any previous visit (from the past 240 hour(s)).  Radiology Reports Dg Chest 2 View  Result Date: 09/25/2016 CLINICAL DATA:  Chest pain and high blood pressure EXAM: CHEST  2 VIEW COMPARISON:  Sep 23, 2016 FINDINGS: Small bilateral pleural effusions and underlying opacities are stable. Mild cardiomegaly. The hila and mediastinum are stable. No suspicious nodules, masses, or infiltrates. IMPRESSION: Stable small bilateral pleural effusions and underlying opacities. Electronically Signed   By: Dorise Bullion III M.D   On: 09/25/2016 10:00   Dg Chest 2 View  Result Date: 09/23/2016 CLINICAL DATA:  Chest pain for 2-3 days.  Lower extremity swelling EXAM: CHEST  2 VIEW COMPARISON:  09/05/2016 FINDINGS: Pulmonary vascular congestion and small pleural effusions. No air bronchogram or generalized Kerley lines. Stable normal heart size. Mild aortic tortuosity. No acute osseous finding.  Remote left humeral neck fracture. IMPRESSION: Pulmonary vascular congestion and small pleural effusions, findings of mild CHF. Electronically Signed   By: Monte Fantasia M.D.   On: 09/23/2016 18:48   Dg Chest 2 View  Result Date: 09/05/2016 CLINICAL DATA:  Chest pain. EXAM: CHEST  2 VIEW COMPARISON:  None. FINDINGS: Upper limits normal heart size noted. There is no evidence of focal airspace disease, pulmonary edema, suspicious pulmonary nodule/mass, pleural effusion, or pneumothorax. No acute bony abnormalities are identified. IMPRESSION: No active cardiopulmonary disease. Electronically Signed   By: Margarette Canada M.D.   On: 09/05/2016 17:32   US Venous Img Lower Bilateral  Result Date: 09/24/2016 CLINICAL DATA:  Acute onset of bilateral leg swelling. Initial encounter. EXAM: BILATERAL LOWER EXTREMITY VENOUS DOPPLER ULTRASOUND TECHNIQUE: Gray-scale sonography with graded compression, as well as color Doppler and  duplex ultrasound were performed to evaluate the lower extremity deep venous systems from the level of  the common femoral vein and including the common femoral, femoral, profunda femoral, popliteal and calf veins including the posterior tibial, peroneal and gastrocnemius veins when visible. The superficial great saphenous vein was also interrogated. Spectral Doppler was utilized to evaluate flow at rest and with distal augmentation maneuvers in the common femoral, femoral and popliteal veins. COMPARISON:  None. FINDINGS: RIGHT LOWER EXTREMITY Common Femoral Vein: No evidence of thrombus. Normal compressibility, respiratory phasicity and response to augmentation. Saphenofemoral Junction: No evidence of thrombus. Normal compressibility and flow on color Doppler imaging. Profunda Femoral Vein: No evidence of thrombus. Normal compressibility and flow on color Doppler imaging. Femoral Vein: No evidence of thrombus. Normal compressibility, respiratory phasicity and response to augmentation. Popliteal Vein: No evidence of thrombus. Normal compressibility, respiratory phasicity and response to augmentation. Calf Veins: No evidence of thrombus. Normal compressibility and flow on color Doppler imaging. Superficial Great Saphenous Vein: No evidence of thrombus. Normal compressibility and flow on color Doppler imaging. Venous Reflux:  None. Other Findings:  None. LEFT LOWER EXTREMITY Common Femoral Vein: No evidence of thrombus. Normal compressibility, respiratory phasicity and response to augmentation. Saphenofemoral Junction: No evidence of thrombus. Normal compressibility and flow on color Doppler imaging. Profunda Femoral Vein: No evidence of thrombus. Normal compressibility and flow on color Doppler imaging. Femoral Vein: No evidence of thrombus. Normal compressibility, respiratory phasicity and response to augmentation. Popliteal Vein: No evidence of thrombus. Normal compressibility, respiratory phasicity and response to  augmentation. Calf Veins: No evidence of thrombus. Normal compressibility and flow on color Doppler imaging. Superficial Great Saphenous Vein: No evidence of thrombus. Normal compressibility and flow on color Doppler imaging. Venous Reflux:  None. Other Findings:  None. IMPRESSION: No evidence of DVT within either lower extremity. Electronically Signed   By: Garald Balding M.D.   On: 09/24/2016 00:15     CBC  Recent Labs Lab 09/21/16 1521 09/23/16 1826 09/24/16 0550  WBC 8.2 8.9 9.6  HGB  --  8.4* 8.5*  HCT 23.5* 24.2* 24.2*  PLT 291 278 306  MCV 94 94.7 92.8  MCH 32.3 32.8 32.5  MCHC 34.5 34.7 35.0  RDW 13.4 13.5 13.2    Chemistries   Recent Labs Lab 09/21/16 1521 09/23/16 1826 09/24/16 0103 09/24/16 0550 09/25/16 0421  NA 139 134*  --  136 137  K 4.1 3.5  --  3.7 3.3*  CL 103 104  --  106 106  CO2 20 21*  --  20* 22  GLUCOSE 96 102*  --  108* 95  BUN 21 27*  --  25* 23*  CREATININE 1.72* 1.57*  --  1.57* 1.49*  CALCIUM 9.0 9.0  --  9.1 8.7*  MG  --   --  1.9  --   --   AST 28  --   --   --   --   ALT 25  --   --   --   --   ALKPHOS 147*  --   --   --   --   BILITOT <0.2  --   --   --   --    ------------------------------------------------------------------------------------------------------------------ estimated creatinine clearance is 29.7 mL/min (A) (by C-G formula based on SCr of 1.49 mg/dL (H)). ------------------------------------------------------------------------------------------------------------------ No results for input(s): HGBA1C in the last 72 hours. ------------------------------------------------------------------------------------------------------------------  Recent Labs  09/24/16 0550  CHOL 165  HDL 62  LDLCALC 78  TRIG 126  CHOLHDL 2.7   ------------------------------------------------------------------------------------------------------------------  Recent Labs  09/24/16 0103  TSH 1.606    ------------------------------------------------------------------------------------------------------------------  No results for input(s): VITAMINB12, FOLATE, FERRITIN, TIBC, IRON, RETICCTPCT in the last 72 hours.  Coagulation profile  Recent Labs Lab 09/24/16 0550  INR 0.97    No results for input(s): DDIMER in the last 72 hours.  Cardiac Enzymes  Recent Labs Lab 09/24/16 0103 09/24/16 0550 09/24/16 1259  TROPONINI <0.03 <0.03 <0.03   ------------------------------------------------------------------------------------------------------------------ Invalid input(s): POCBNP    Assessment & Plan   AThis is a 66 y.o. female with a history of anxiety, GERD, HTN, CKD3, thyroid noduleand deficiency, anemia  now being admitted with:  #. Chest pain,  Possibly related to elevated blood pressure cardiac enzymes negative Echocardiogram showed no significant wall motion abnormality  #Possible congestive heart failure Place patient on oral Lasix  #. Uncontrolled HTN, ? 2/2 anxiety I will increase Coreg and clonidine Use when necessary IV hydralazine   #. anemia -appears to be chronic in nature no indication for transfusion    #. Mild hyponatremia, significantly improved from previous hospitalization -Monitor BMP  #. History of CK D3, baseline -Monitor his BMP  #. History of GERD -Continue Protonix before meals and Pepcid once daily  #. History of depression/anxiety -Continue Wellbutrin, Ativan   #cough and low-grade fever spatient symptoms consistent with possible acute bronchitis we'll treat with Levaquin    Code Status Orders        Start     Ordered   09/24/16 0050  Full code  Continuous     09/24/16 0049    Code Status History    Date Active Date Inactive Code Status Order ID Comments User Context   09/06/2016 12:30 AM 09/09/2016  4:24 PM Full Code [id number]  Lance Coon, MD Inpatient    Advance Directive Documentation     Most Recent Value   Type of Advance Directive  Living will  Pre-existing out of facility DNR order (yellow form or pink MOST form)  -  "MOST" Form in Place?  -           Consults cardiology   DVT Prophylaxis  Lovenox   Lab Results  Component Value Date   PLT 306 09/24/2016     Time Spent in minutes  61min  Greater than 50% of time spent in care coordination and counseling patient regarding the condition and plan of care.   Dustin Flock M.D on 09/25/2016 at 2:31 PM  Between 7am to 6pm - Pager - [phone]  After 6pm go to [email or web address] - password [email or web address] [facility]   Office  [phone]

## 2016-09-25 NOTE — Progress Notes (Signed)
PHARMACY NOTE -  ANTIBIOTIC RENAL DOSE ADJUSTMENT   Patient has been initiated on levofloxacin 500mg  every 24 hours.   Current  SCr is 1.49 and estimated CrCl is 29.7 ml/min  Pharmacy has adjusted the Abx dose per protocol based on patient's current renal function.   Patient is now receiving levofloxacin 250mg  every 24 hours.   Pernell Dupre, PharmD, BCPS Clinical Pharmacist 09/25/2016 9:25 AM

## 2016-09-26 ENCOUNTER — Observation Stay: Payer: Medicare Other

## 2016-09-26 DIAGNOSIS — M7989 Other specified soft tissue disorders: Secondary | ICD-10-CM | POA: Diagnosis not present

## 2016-09-26 DIAGNOSIS — I10 Essential (primary) hypertension: Secondary | ICD-10-CM | POA: Diagnosis not present

## 2016-09-26 DIAGNOSIS — J90 Pleural effusion, not elsewhere classified: Secondary | ICD-10-CM | POA: Diagnosis not present

## 2016-09-26 DIAGNOSIS — N183 Chronic kidney disease, stage 3 (moderate): Secondary | ICD-10-CM | POA: Diagnosis not present

## 2016-09-26 DIAGNOSIS — R079 Chest pain, unspecified: Secondary | ICD-10-CM | POA: Diagnosis not present

## 2016-09-26 DIAGNOSIS — K219 Gastro-esophageal reflux disease without esophagitis: Secondary | ICD-10-CM | POA: Diagnosis not present

## 2016-09-26 DIAGNOSIS — R0602 Shortness of breath: Secondary | ICD-10-CM | POA: Diagnosis not present

## 2016-09-26 DIAGNOSIS — K5730 Diverticulosis of large intestine without perforation or abscess without bleeding: Secondary | ICD-10-CM | POA: Diagnosis not present

## 2016-09-26 LAB — URINE CULTURE: CULTURE: NO GROWTH

## 2016-09-26 LAB — BASIC METABOLIC PANEL
ANION GAP: 8 (ref 5–15)
BUN: 27 mg/dL — ABNORMAL HIGH (ref 6–20)
CALCIUM: 8.8 mg/dL — AB (ref 8.9–10.3)
CO2: 23 mmol/L (ref 22–32)
CREATININE: 1.7 mg/dL — AB (ref 0.44–1.00)
Chloride: 107 mmol/L (ref 101–111)
GFR calc Af Amer: 35 mL/min — ABNORMAL LOW (ref >60)
GFR calc non Af Amer: 30 mL/min — ABNORMAL LOW (ref >60)
Glucose, Bld: 100 mg/dL — ABNORMAL HIGH (ref 65–99)
Potassium: 3.8 mmol/L (ref 3.5–5.1)
SODIUM: 138 mmol/L (ref 135–145)

## 2016-09-26 LAB — CBC
HEMATOCRIT: 22.4 % — AB (ref 35.0–47.0)
HEMOGLOBIN: 7.7 g/dL — AB (ref 12.0–16.0)
MCH: 32.4 pg (ref 26.0–34.0)
MCHC: 34.5 g/dL (ref 32.0–36.0)
MCV: 93.8 fL (ref 80.0–100.0)
Platelets: 249 10*3/uL (ref 150–440)
RBC: 2.39 MIL/uL — ABNORMAL LOW (ref 3.80–5.20)
RDW: 13.1 % (ref 11.5–14.5)
WBC: 5 10*3/uL (ref 3.6–11.0)

## 2016-09-26 LAB — LACTATE DEHYDROGENASE: LDH: 144 U/L (ref 98–192)

## 2016-09-26 MED ORDER — CLONIDINE HCL 0.1 MG PO TABS
0.2000 mg | ORAL_TABLET | Freq: Three times a day (TID) | ORAL | Status: DC
  Administered 2016-09-26 – 2016-09-27 (×4): 0.2 mg via ORAL
  Filled 2016-09-26 (×4): qty 2

## 2016-09-26 MED ORDER — IOPAMIDOL (ISOVUE-300) INJECTION 61%
15.0000 mL | INTRAVENOUS | Status: AC
Start: 2016-09-26 — End: 2016-09-26
  Administered 2016-09-26 (×2): 15 mL via ORAL

## 2016-09-26 MED ORDER — ZOLPIDEM TARTRATE 5 MG PO TABS
10.0000 mg | ORAL_TABLET | Freq: Every evening | ORAL | Status: DC | PRN
  Administered 2016-09-26: 10 mg via ORAL
  Filled 2016-09-26: qty 2

## 2016-09-26 NOTE — Progress Notes (Signed)
[place] at [facility]                                                                                                                                                                                  Patient Demographics   Amy [name], is a 66 y.o. female, DOB - 01-11-1951, PNT:[id number]  Admit date - 09/23/2016   Admitting Physician Harvie Bridge, DO  Outpatient Primary MD for the patient is [name], MD   LOS - 0  Subjective: Still has some shortness of breath.  blood pressures improving  Review of Systems:   CONSTITUTIONAL: No documented fever. No fatigue, weakness. No weight gain, no weight loss.  EYES: No blurry or double vision.  ENT: No tinnitus. No postnasal drip. No redness of the oropharynx.  RESPIRATORY:positive cough, no wheeze, no hemoptysis. No dyspnea.  CARDIOVASCULAR: No chest pain. No orthopnea. No palpitations. No syncope. + chest pressure GASTROINTESTINAL: No nausea, no vomiting or diarrhea. No abdominal pain. No melena or hematochezia.  GENITOURINARY: No dysuria or hematuria.  ENDOCRINE: No polyuria or nocturia. No heat or cold intolerance.  HEMATOLOGY: No anemia. No bruising. No bleeding.  INTEGUMENTARY: No rashes. No lesions.  MUSCULOSKELETAL: No arthritis. No swelling. No gout.  NEUROLOGIC: No numbness, tingling, or ataxia. No seizure-type activity.  PSYCHIATRIC: No anxiety. No insomnia. No ADD.    Vitals:   Vitals:   09/26/16 0639 09/26/16 0800 09/26/16 1215 09/26/16 1245  BP: (!) 157/64 (!) 169/66 136/65 (!) 148/57  Pulse: 76 79 71   Resp:  16 14   Temp:  98.8 F (37.1 C) 97.5 F (36.4 C)   TempSrc:  Oral Oral   SpO2: 98% 98% 98%   Weight:      Height:        Wt Readings from Last 3 Encounters:  09/26/16 113 lb 1.5 oz (51.3 kg)  09/21/16 119 lb (54 kg)  09/07/16 107 lb (48.5 kg)     Intake/Output Summary (Last 24 hours) at 09/26/16 1309 Last data filed at 09/26/16 0300  Gross per 24 hour  Intake                 0 ml  Output              600 ml  Net             -600 ml    Physical Exam:   GENERAL: Pleasant-appearing in no apparent distress.  HEAD, EYES, EARS, NOSE AND THROAT: Atraumatic, normocephalic. Extraocular muscles are intact. Pupils equal and reactive to light. Sclerae anicteric. No conjunctival injection. No oro-pharyngeal erythema.  NECK: Supple. There is no jugular venous  distention. No bruits, no lymphadenopathy, no thyromegaly.  HEART: Regular rate and rhythm,. No murmurs, no rubs, no clicks.  LUNGS: + occasion crackles ABDOMEN: Soft, flat, nontender, nondistended. Has good bowel sounds. No hepatosplenomegaly appreciated.  EXTREMITIES: No evidence of any cyanosis, clubbing, or peripheral edema.  +2 pedal and radial pulses bilaterally.  NEUROLOGIC: The patient is alert, awake, and oriented x3 with no focal motor or sensory deficits appreciated bilaterally.  SKIN: Moist and warm with no rashes appreciated.  Psych: Not anxious, depressed LN: No inguinal LN enlargement    Antibiotics   Anti-infectives    Start     Dose/Rate Route Frequency Ordered Stop   09/25/16 1000  levofloxacin (LEVAQUIN) tablet 250 mg     250 mg Oral Daily 09/25/16 0918        Medications   Scheduled Meds: . aspirin EC  81 mg Oral Daily  . atorvastatin  40 mg Oral q1800  . buPROPion  150 mg Oral Daily  . carvedilol  25 mg Oral BID WC  . cloNIDine  0.2 mg Oral TID  . famotidine  10 mg Oral Daily  . ferrous sulfate  325 mg Oral TID WC  . irbesartan  300 mg Oral Daily  . levofloxacin  250 mg Oral Daily  . pantoprazole  40 mg Oral BID AC  . sodium chloride flush  3 mL Intravenous Q12H   Continuous Infusions: PRN Meds:.acetaminophen, butalbital-acetaminophen-caffeine, hydrALAZINE, LORazepam, morphine injection, nitroGLYCERIN, ondansetron (ZOFRAN) IV, sodium chloride flush, zolpidem   Data Review:   Micro Results No results found for this or any previous visit (from the past 240  hour(s)).  Radiology Reports Dg Chest 2 View  Result Date: 09/25/2016 CLINICAL DATA:  Chest pain and high blood pressure EXAM: CHEST  2 VIEW COMPARISON:  Sep 23, 2016 FINDINGS: Small bilateral pleural effusions and underlying opacities are stable. Mild cardiomegaly. The hila and mediastinum are stable. No suspicious nodules, masses, or infiltrates. IMPRESSION: Stable small bilateral pleural effusions and underlying opacities. Electronically Signed   By: Dorise Bullion III M.D   On: 09/25/2016 10:00   Dg Chest 2 View  Result Date: 09/23/2016 CLINICAL DATA:  Chest pain for 2-3 days.  Lower extremity swelling EXAM: CHEST  2 VIEW COMPARISON:  09/05/2016 FINDINGS: Pulmonary vascular congestion and small pleural effusions. No air bronchogram or generalized Kerley lines. Stable normal heart size. Mild aortic tortuosity. No acute osseous finding.  Remote left humeral neck fracture. IMPRESSION: Pulmonary vascular congestion and small pleural effusions, findings of mild CHF. Electronically Signed   By: Monte Fantasia M.D.   On: 09/23/2016 18:48   Dg Chest 2 View  Result Date: 09/05/2016 CLINICAL DATA:  Chest pain. EXAM: CHEST  2 VIEW COMPARISON:  None. FINDINGS: Upper limits normal heart size noted. There is no evidence of focal airspace disease, pulmonary edema, suspicious pulmonary nodule/mass, pleural effusion, or pneumothorax. No acute bony abnormalities are identified. IMPRESSION: No active cardiopulmonary disease. Electronically Signed   By: Margarette Canada M.D.   On: 09/05/2016 17:32   Ct Chest Wo Contrast  Result Date: 09/26/2016 CLINICAL DATA:  Cough and shortness of breath. EXAM: CT CHEST WITHOUT CONTRAST TECHNIQUE: Multidetector CT imaging of the chest was performed following the standard protocol without IV contrast. COMPARISON:  Chest x-ray from yesterday FINDINGS: Cardiovascular: The thoracic aorta is normal in caliber with minimal atherosclerotic change. Central pulmonary arteries are normal in  caliber. There are coronary artery calcifications. The heart is borderline to mildly enlarged. Mediastinum/Nodes: Thyroid nodularity is  identified. A representative nodule on series 2, image 6 measures 12 mm. The esophagus is normal. No pericardial effusion. No adenopathy. Lungs/Pleura: Central airways are normal. No pneumothorax. Scattered ground-glass opacities are seen, primarily in the lung apices, some of which demonstrate a mildly nodular appearance. No interlobular septal thickening is identified. There are small bilateral pleural effusions with underlying atelectasis. No other suspicious nodules. No masses. No other infiltrates. Upper Abdomen: Mild ascites in the upper abdomen. Shotty nodes are seen in the upper abdomen, incompletely evaluated. No free air. Musculoskeletal: No chest wall mass or suspicious bone lesions identified. IMPRESSION: 1. Scattered ground-glass opacities, some of which are nodular, primarily in the upper lobes bilaterally are nonspecific. These most likely represent an infectious or inflammatory process. Recommend short-term follow-up to ensure resolution. 2. Small to moderate bilateral pleural effusions with underlying atelectasis. 3. Thyroid nodularity with a represent nodule measuring 12 mm. Recommend assessment with ultrasound. 4. The upper abdomen is incompletely evaluated but there does appear to be mild ascites as well some shotty nodes in the upper abdomen. There is also mild increased attenuation in the upper abdominal fat. A dedicated abdomen and pelvis CT could be performed for further evaluation. Electronically Signed   By: Dorise Bullion III M.D   On: 09/26/2016 12:05   US Venous Img Lower Bilateral  Result Date: 09/24/2016 CLINICAL DATA:  Acute onset of bilateral leg swelling. Initial encounter. EXAM: BILATERAL LOWER EXTREMITY VENOUS DOPPLER ULTRASOUND TECHNIQUE: Gray-scale sonography with graded compression, as well as color Doppler and duplex ultrasound were  performed to evaluate the lower extremity deep venous systems from the level of the common femoral vein and including the common femoral, femoral, profunda femoral, popliteal and calf veins including the posterior tibial, peroneal and gastrocnemius veins when visible. The superficial great saphenous vein was also interrogated. Spectral Doppler was utilized to evaluate flow at rest and with distal augmentation maneuvers in the common femoral, femoral and popliteal veins. COMPARISON:  None. FINDINGS: RIGHT LOWER EXTREMITY Common Femoral Vein: No evidence of thrombus. Normal compressibility, respiratory phasicity and response to augmentation. Saphenofemoral Junction: No evidence of thrombus. Normal compressibility and flow on color Doppler imaging. Profunda Femoral Vein: No evidence of thrombus. Normal compressibility and flow on color Doppler imaging. Femoral Vein: No evidence of thrombus. Normal compressibility, respiratory phasicity and response to augmentation. Popliteal Vein: No evidence of thrombus. Normal compressibility, respiratory phasicity and response to augmentation. Calf Veins: No evidence of thrombus. Normal compressibility and flow on color Doppler imaging. Superficial Great Saphenous Vein: No evidence of thrombus. Normal compressibility and flow on color Doppler imaging. Venous Reflux:  None. Other Findings:  None. LEFT LOWER EXTREMITY Common Femoral Vein: No evidence of thrombus. Normal compressibility, respiratory phasicity and response to augmentation. Saphenofemoral Junction: No evidence of thrombus. Normal compressibility and flow on color Doppler imaging. Profunda Femoral Vein: No evidence of thrombus. Normal compressibility and flow on color Doppler imaging. Femoral Vein: No evidence of thrombus. Normal compressibility, respiratory phasicity and response to augmentation. Popliteal Vein: No evidence of thrombus. Normal compressibility, respiratory phasicity and response to augmentation. Calf  Veins: No evidence of thrombus. Normal compressibility and flow on color Doppler imaging. Superficial Great Saphenous Vein: No evidence of thrombus. Normal compressibility and flow on color Doppler imaging. Venous Reflux:  None. Other Findings:  None. IMPRESSION: No evidence of DVT within either lower extremity. Electronically Signed   By: Garald Balding M.D.   On: 09/24/2016 00:15     CBC  Recent Labs Lab 09/21/16 1521 09/23/16  1826 09/24/16 0550 09/26/16 0422  WBC 8.2 8.9 9.6 5.0  HGB  --  8.4* 8.5* 7.7*  HCT 23.5* 24.2* 24.2* 22.4*  PLT 291 278 306 249  MCV 94 94.7 92.8 93.8  MCH 32.3 32.8 32.5 32.4  MCHC 34.5 34.7 35.0 34.5  RDW 13.4 13.5 13.2 13.1    Chemistries   Recent Labs Lab 09/21/16 1521 09/23/16 1826 09/24/16 0103 09/24/16 0550 09/25/16 0421 09/26/16 0422  NA 139 134*  --  136 137 138  K 4.1 3.5  --  3.7 3.3* 3.8  CL 103 104  --  106 106 107  CO2 20 21*  --  20* 22 23  GLUCOSE 96 102*  --  108* 95 100*  BUN 21 27*  --  25* 23* 27*  CREATININE 1.72* 1.57*  --  1.57* 1.49* 1.70*  CALCIUM 9.0 9.0  --  9.1 8.7* 8.8*  MG  --   --  1.9  --   --   --   AST 28  --   --   --   --   --   ALT 25  --   --   --   --   --   ALKPHOS 147*  --   --   --   --   --   BILITOT <0.2  --   --   --   --   --    ------------------------------------------------------------------------------------------------------------------ estimated creatinine clearance is 26 mL/min (A) (by C-G formula based on SCr of 1.7 mg/dL (H)). ------------------------------------------------------------------------------------------------------------------ No results for input(s): HGBA1C in the last 72 hours. ------------------------------------------------------------------------------------------------------------------  Recent Labs  09/24/16 0550  CHOL 165  HDL 62  LDLCALC 78  TRIG 126  CHOLHDL 2.7    ------------------------------------------------------------------------------------------------------------------  Recent Labs  09/24/16 0103  TSH 1.606   ------------------------------------------------------------------------------------------------------------------ No results for input(s): VITAMINB12, FOLATE, FERRITIN, TIBC, IRON, RETICCTPCT in the last 72 hours.  Coagulation profile  Recent Labs Lab 09/24/16 0550  INR 0.97    No results for input(s): DDIMER in the last 72 hours.  Cardiac Enzymes  Recent Labs Lab 09/24/16 0103 09/24/16 0550 09/24/16 1259  TROPONINI <0.03 <0.03 <0.03   ------------------------------------------------------------------------------------------------------------------ Invalid input(s): POCBNP    Assessment & Plan   AThis is a 66 y.o. female with a history of anxiety, GERD, HTN, CKD3, thyroid noduleand deficiency, anemia  now being admitted with:  #. Chest pain,  Possibly related to elevated blood pressure cardiac enzymes negative Echocardiogram showed no significant wall motion abnormality Outpatient stress test   #Acute diastolic CHF renal function little worse they'll stop Lasix  #Shortness of breath persist patient still has crackles heard on the lungs I will obtain a CT of the chest without contrast further evaluate  #. Uncontrolled HTN, ? 2/2 anxiety Change clonidine to 3 times a day Use when necessary IV hydralazine   #. anemia -appears to be chronic , now worst, check LDH haptoglobin globulin   #. Mild hyponatremia, significantly improved from previous hospitalization -Monitor BMP  #. History of CK D3, baseline -Monitor his BMP  #. History of GERD -Continue Protonix before meals and Pepcid once daily  #. History of depression/anxiety -Continue Wellbutrin, Ativan   #cough and low-grade fever spatient symptoms consistent with possible acute bronchitis we'll treat with Levaquin    Code Status Orders         Start     Ordered   09/24/16 0050  Full code  Continuous     09/24/16 0049  Code Status History    Date Active Date Inactive Code Status Order ID Comments User Context   09/06/2016 12:30 AM 09/09/2016  4:24 PM Full Code [id number]  Lance Coon, MD Inpatient    Advance Directive Documentation     Most Recent Value  Type of Advance Directive  Living will  Pre-existing out of facility DNR order (yellow form or pink MOST form)  -  "MOST" Form in Place?  -           Consults cardiology   DVT Prophylaxis  Lovenox   Lab Results  Component Value Date   PLT 249 09/26/2016     Time Spent in minutes  40min  Greater than 50% of time spent in care coordination and counseling patient regarding the condition and plan of care.   Dustin Flock M.D on 09/26/2016 at 1:09 PM  Between 7am to 6pm - Pager - [phone]  After 6pm go to [email or web address] - password [email or web address] [facility]   Office  [phone]

## 2016-09-26 NOTE — Progress Notes (Signed)
Notified MD of Pt dose of Ambien being changed. Pt at home takes Ambien 10mg  per PTA medications and per pt.. Order modified. Will continue monitor and assess.

## 2016-09-27 DIAGNOSIS — N183 Chronic kidney disease, stage 3 (moderate): Secondary | ICD-10-CM | POA: Diagnosis not present

## 2016-09-27 DIAGNOSIS — R079 Chest pain, unspecified: Secondary | ICD-10-CM | POA: Diagnosis not present

## 2016-09-27 DIAGNOSIS — M7989 Other specified soft tissue disorders: Secondary | ICD-10-CM | POA: Diagnosis not present

## 2016-09-27 DIAGNOSIS — I10 Essential (primary) hypertension: Secondary | ICD-10-CM | POA: Diagnosis not present

## 2016-09-27 DIAGNOSIS — K219 Gastro-esophageal reflux disease without esophagitis: Secondary | ICD-10-CM | POA: Diagnosis not present

## 2016-09-27 LAB — HAPTOGLOBIN: Haptoglobin: 194 mg/dL (ref 34–200)

## 2016-09-27 LAB — CBC
HEMATOCRIT: 22 % — AB (ref 35.0–47.0)
HEMOGLOBIN: 7.7 g/dL — AB (ref 12.0–16.0)
MCH: 32.4 pg (ref 26.0–34.0)
MCHC: 35.2 g/dL (ref 32.0–36.0)
MCV: 92 fL (ref 80.0–100.0)
Platelets: 262 10*3/uL (ref 150–440)
RBC: 2.39 MIL/uL — ABNORMAL LOW (ref 3.80–5.20)
RDW: 13.2 % (ref 11.5–14.5)
WBC: 4.5 10*3/uL (ref 3.6–11.0)

## 2016-09-27 LAB — BASIC METABOLIC PANEL
ANION GAP: 8 (ref 5–15)
BUN: 26 mg/dL — ABNORMAL HIGH (ref 6–20)
CALCIUM: 8.6 mg/dL — AB (ref 8.9–10.3)
CO2: 23 mmol/L (ref 22–32)
Chloride: 102 mmol/L (ref 101–111)
Creatinine, Ser: 1.63 mg/dL — ABNORMAL HIGH (ref 0.44–1.00)
GFR calc Af Amer: 37 mL/min — ABNORMAL LOW (ref >60)
GFR calc non Af Amer: 32 mL/min — ABNORMAL LOW (ref >60)
GLUCOSE: 99 mg/dL (ref 65–99)
POTASSIUM: 3.6 mmol/L (ref 3.5–5.1)
Sodium: 133 mmol/L — ABNORMAL LOW (ref 135–145)

## 2016-09-27 MED ORDER — CLONIDINE HCL 0.2 MG PO TABS: 0.2000 mg | ORAL_TABLET | Freq: Three times a day (TID) | ORAL | 11 refills | Status: DC

## 2016-09-27 MED ORDER — HYDRALAZINE HCL 25 MG PO TABS
25.0000 mg | ORAL_TABLET | Freq: Two times a day (BID) | ORAL | Status: DC | PRN
  Administered 2016-09-27: 25 mg via ORAL
  Filled 2016-09-27: qty 1

## 2016-09-27 MED ORDER — ASPIRIN 81 MG PO TBEC: 81.0000 mg | DELAYED_RELEASE_TABLET | Freq: Every day | ORAL | Status: DC

## 2016-09-27 MED ORDER — LEVOFLOXACIN 250 MG PO TABS: 250.0000 mg | ORAL_TABLET | Freq: Every day | ORAL | 0 refills | Status: DC

## 2016-09-27 MED ORDER — CARVEDILOL 25 MG PO TABS: 25.0000 mg | ORAL_TABLET | Freq: Two times a day (BID) | ORAL | 0 refills | Status: DC

## 2016-09-27 MED ORDER — HYDRALAZINE HCL 25 MG PO TABS: 25.0000 mg | ORAL_TABLET | Freq: Two times a day (BID) | ORAL | 0 refills | Status: DC | PRN

## 2016-09-27 NOTE — Discharge Instructions (Signed)
[facility] [place] [facility] ° °DIET:  °Cardiac diet ° °DISCHARGE CONDITION:  °Stable ° °ACTIVITY:  °Activity as tolerated ° °OXYGEN:  °Home Oxygen: No. °  °Oxygen Delivery: room air ° °DISCHARGE LOCATION:  °home  ° ° °ADDITIONAL DISCHARGE INSTRUCTION: ° ° °If you experience worsening of your admission symptoms, develop shortness of breath, life threatening emergency, suicidal or homicidal thoughts you must seek medical attention immediately by calling 911 or calling your MD immediately  if symptoms less severe. ° °You Must read complete instructions/literature along with all the possible adverse reactions/side effects for all the Medicines you take and that have been prescribed to you. Take any new Medicines after you have completely understood and accpet all the possible adverse reactions/side effects.  ° °Please note ° °You were cared for by a hospitalist during your hospital stay. If you have any questions about your discharge medications or the care you received while you were in the hospital after you are discharged, you can call the unit and asked to speak with the hospitalist on call if the hospitalist that took care of you is not available. Once you are discharged, your primary care physician will handle any further medical issues. Please note that NO REFILLS for any discharge medications will be authorized once you are discharged, as it is imperative that you return to your primary care physician (or establish a relationship with a primary care physician if you do not have one) for your aftercare needs so that they can reassess your need for medications and monitor your lab values. ° ° °

## 2016-09-27 NOTE — Discharge Summary (Signed)
[place] at [place], 66 y.o., DOB Apr 24, 1951, MRN [id number]. Admission date: 09/23/2016 Discharge Date 09/27/2016 Primary MD [name], MD Admitting Physician Harvie Bridge, DO  Admission Diagnosis  Leg swelling [M79.89] Chest tightness or pressure [R07.89] Hypertension, unspecified type [I10]  Discharge Diagnosis   Active Problems:  Chest pain, due to elevated blood pressure  Accelerated hypertension Acute diastolic CHF exasperation Chronic kidney disease  3 Leg swelling Intractable episodic headache Gastritis Anemia of chronic disease  Possible pneumonia       Hospital CourseSusan [name] is a 66 y.o. female with a known history of anxiety, GERD, HTN, CKD3, iron deficiency, anemia  thyroid nodules s/p recent hospitalization for hypertension and hyponatremia  presents to the emergency department for evaluation of chest pain. She was admitted for further evaluation. Heart enzymes continued to remain negative. She was seen by cardiology dated a echocardiogram which did not show any significant wall motion abnormality. She will need outpatient stress test. She continued complaining of cough and congestion. Therefore add a CT scan of the chest which showed apical inflammatory process. She was started on Levaquin. Also had bilateral pleural effusions which were mild. She did receive IV Lasix but her renal function worsened therefore the Lasix was discontinued. Patient was also thought to have ascites on the chest CT therefore had a CT of abdomen which failed to show any ascites. Patient's blood pressure has been very labile and high. Therefore her medications were adjusted. I suspect anxiety to be playing some role in her current symptoms.             Consults  cardiology  Significant Tests:  See full reports for all details     Ct Abdomen Pelvis Wo Contrast  Result Date: 09/26/2016 CLINICAL DATA:  Cough and shortness of  breath. Ascites on chest CT today. Previous cholecystectomy and hysterectomy. EXAM: CT ABDOMEN AND PELVIS WITHOUT CONTRAST TECHNIQUE: Multidetector CT imaging of the abdomen and pelvis was performed following the standard protocol without IV contrast. COMPARISON:  Report only from abdominal CT 08/02/2002. Abdominal ultrasound 01/13/2010 and chest CT today. FINDINGS: Lower chest: Small dependent bilateral pleural effusions and associated bibasilar atelectasis are similar to the preceding chest CT. The heart size is normal. No pericardial effusion. Hepatobiliary: There are probable scattered hepatic cysts, measuring up to 7 mm inferiorly in the right lobe on image 33. Small cysts are present in the left lobe on images 21 and 28. No worrisome hepatic findings on noncontrast imaging. No biliary dilatation status post cholecystectomy. Pancreas: Unremarkable. No pancreatic ductal dilatation or surrounding inflammatory changes. Spleen: Normal in size without focal abnormality. Adrenals/Urinary Tract: Both adrenal glands appear normal. There is mild symmetric perinephric soft tissue stranding bilaterally. No focal renal lesion, hydronephrosis or urinary tract calculus demonstrated. The bladder appears unremarkable. Stomach/Bowel: No evidence of bowel wall thickening, distention or surrounding inflammatory change. There are moderate diverticular changes of the sigmoid colon. The appendix is not visualized. Vascular/Lymphatic: There are no enlarged abdominal or pelvic lymph nodes. Minimal aortic atherosclerosis. No acute vascular findings on noncontrast imaging. Reproductive: Hysterectomy. Probable residual ovarian tissue bilaterally. No evidence of adnexal mass. Other: No significant ascites or focal fluid collection. There is mild generalized soft tissue edema. Musculoskeletal: No acute or significant osseous findings. There is multilevel spondylosis associated with a mild scoliosis. IMPRESSION: 1. No acute abdominopelvic  findings. 2. Soft tissue edema without significant ascites or focal fluid collection. Stable bilateral pleural effusions and bibasilar atelectasis.  3. Sigmoid diverticulosis without evidence of acute inflammation. Electronically Signed   By: Richardean Sale M.D.   On: 09/26/2016 19:19   Dg Chest 2 View  Result Date: 09/25/2016 CLINICAL DATA:  Chest pain and high blood pressure EXAM: CHEST  2 VIEW COMPARISON:  Sep 23, 2016 FINDINGS: Small bilateral pleural effusions and underlying opacities are stable. Mild cardiomegaly. The hila and mediastinum are stable. No suspicious nodules, masses, or infiltrates. IMPRESSION: Stable small bilateral pleural effusions and underlying opacities. Electronically Signed   By: Dorise Bullion III M.D   On: 09/25/2016 10:00   Dg Chest 2 View  Result Date: 09/23/2016 CLINICAL DATA:  Chest pain for 2-3 days.  Lower extremity swelling EXAM: CHEST  2 VIEW COMPARISON:  09/05/2016 FINDINGS: Pulmonary vascular congestion and small pleural effusions. No air bronchogram or generalized Kerley lines. Stable normal heart size. Mild aortic tortuosity. No acute osseous finding.  Remote left humeral neck fracture. IMPRESSION: Pulmonary vascular congestion and small pleural effusions, findings of mild CHF. Electronically Signed   By: Monte Fantasia M.D.   On: 09/23/2016 18:48   Dg Chest 2 View  Result Date: 09/05/2016 CLINICAL DATA:  Chest pain. EXAM: CHEST  2 VIEW COMPARISON:  None. FINDINGS: Upper limits normal heart size noted. There is no evidence of focal airspace disease, pulmonary edema, suspicious pulmonary nodule/mass, pleural effusion, or pneumothorax. No acute bony abnormalities are identified. IMPRESSION: No active cardiopulmonary disease. Electronically Signed   By: Margarette Canada M.D.   On: 09/05/2016 17:32   Ct Chest Wo Contrast  Result Date: 09/26/2016 CLINICAL DATA:  Cough and shortness of breath. EXAM: CT CHEST WITHOUT CONTRAST TECHNIQUE: Multidetector CT imaging of the  chest was performed following the standard protocol without IV contrast. COMPARISON:  Chest x-ray from yesterday FINDINGS: Cardiovascular: The thoracic aorta is normal in caliber with minimal atherosclerotic change. Central pulmonary arteries are normal in caliber. There are coronary artery calcifications. The heart is borderline to mildly enlarged. Mediastinum/Nodes: Thyroid nodularity is identified. A representative nodule on series 2, image 6 measures 12 mm. The esophagus is normal. No pericardial effusion. No adenopathy. Lungs/Pleura: Central airways are normal. No pneumothorax. Scattered ground-glass opacities are seen, primarily in the lung apices, some of which demonstrate a mildly nodular appearance. No interlobular septal thickening is identified. There are small bilateral pleural effusions with underlying atelectasis. No other suspicious nodules. No masses. No other infiltrates. Upper Abdomen: Mild ascites in the upper abdomen. Shotty nodes are seen in the upper abdomen, incompletely evaluated. No free air. Musculoskeletal: No chest wall mass or suspicious bone lesions identified. IMPRESSION: 1. Scattered ground-glass opacities, some of which are nodular, primarily in the upper lobes bilaterally are nonspecific. These most likely represent an infectious or inflammatory process. Recommend short-term follow-up to ensure resolution. 2. Small to moderate bilateral pleural effusions with underlying atelectasis. 3. Thyroid nodularity with a represent nodule measuring 12 mm. Recommend assessment with ultrasound. 4. The upper abdomen is incompletely evaluated but there does appear to be mild ascites as well some shotty nodes in the upper abdomen. There is also mild increased attenuation in the upper abdominal fat. A dedicated abdomen and pelvis CT could be performed for further evaluation. Electronically Signed   By: Dorise Bullion III M.D   On: 09/26/2016 12:05   US Venous Img Lower Bilateral  Result Date:  09/24/2016 CLINICAL DATA:  Acute onset of bilateral leg swelling. Initial encounter. EXAM: BILATERAL LOWER EXTREMITY VENOUS DOPPLER ULTRASOUND TECHNIQUE: Gray-scale sonography with graded compression, as well as  color Doppler and duplex ultrasound were performed to evaluate the lower extremity deep venous systems from the level of the common femoral vein and including the common femoral, femoral, profunda femoral, popliteal and calf veins including the posterior tibial, peroneal and gastrocnemius veins when visible. The superficial great saphenous vein was also interrogated. Spectral Doppler was utilized to evaluate flow at rest and with distal augmentation maneuvers in the common femoral, femoral and popliteal veins. COMPARISON:  None. FINDINGS: RIGHT LOWER EXTREMITY Common Femoral Vein: No evidence of thrombus. Normal compressibility, respiratory phasicity and response to augmentation. Saphenofemoral Junction: No evidence of thrombus. Normal compressibility and flow on color Doppler imaging. Profunda Femoral Vein: No evidence of thrombus. Normal compressibility and flow on color Doppler imaging. Femoral Vein: No evidence of thrombus. Normal compressibility, respiratory phasicity and response to augmentation. Popliteal Vein: No evidence of thrombus. Normal compressibility, respiratory phasicity and response to augmentation. Calf Veins: No evidence of thrombus. Normal compressibility and flow on color Doppler imaging. Superficial Great Saphenous Vein: No evidence of thrombus. Normal compressibility and flow on color Doppler imaging. Venous Reflux:  None. Other Findings:  None. LEFT LOWER EXTREMITY Common Femoral Vein: No evidence of thrombus. Normal compressibility, respiratory phasicity and response to augmentation. Saphenofemoral Junction: No evidence of thrombus. Normal compressibility and flow on color Doppler imaging. Profunda Femoral Vein: No evidence of thrombus. Normal compressibility and flow on color  Doppler imaging. Femoral Vein: No evidence of thrombus. Normal compressibility, respiratory phasicity and response to augmentation. Popliteal Vein: No evidence of thrombus. Normal compressibility, respiratory phasicity and response to augmentation. Calf Veins: No evidence of thrombus. Normal compressibility and flow on color Doppler imaging. Superficial Great Saphenous Vein: No evidence of thrombus. Normal compressibility and flow on color Doppler imaging. Venous Reflux:  None. Other Findings:  None. IMPRESSION: No evidence of DVT within either lower extremity. Electronically Signed   By: Garald Balding M.D.   On: 09/24/2016 00:15       Today   Subjective:   Amy [name]  Pt feels ok denies any complatins  Objective:   Blood pressure 129/65, pulse 70, temperature 98.6 F (37 C), temperature source Oral, resp. rate 16, height 5\' 4"  (1.626 m), weight 113 lb 1.5 oz (51.3 kg), SpO2 99 %.  .  Intake/Output Summary (Last 24 hours) at 09/27/16 1259 Last data filed at 09/27/16 1031  Gross per 24 hour  Intake              360 ml  Output              400 ml  Net              -40 ml    Exam VITAL SIGNS: Blood pressure 129/65, pulse 70, temperature 98.6 F (37 C), temperature source Oral, resp. rate 16, height 5\' 4"  (1.626 m), weight 113 lb 1.5 oz (51.3 kg), SpO2 99 %.  GENERAL:  66 y.o.-year-old patient lying in the bed with no acute distress.  EYES: Pupils equal, round, reactive to light and accommodation. No scleral icterus. Extraocular muscles intact.  HEENT: Head atraumatic, normocephalic. Oropharynx and nasopharynx clear.  NECK:  Supple, no jugular venous distention. No thyroid enlargement, no tenderness.  LUNGS: Normal breath sounds bilaterally, no wheezing, rales,rhonchi or crepitation. No use of accessory muscles of respiration.  CARDIOVASCULAR: S1, S2 normal. No murmurs, rubs, or gallops.  ABDOMEN: Soft, nontender, nondistended. Bowel sounds present. No organomegaly or mass.   EXTREMITIES: No pedal edema, cyanosis, or clubbing.  NEUROLOGIC: Cranial nerves  II through XII are intact. Muscle strength 5/5 in all extremities. Sensation intact. Gait not checked.  PSYCHIATRIC: The patient is alert and oriented x 3.  SKIN: No obvious rash, lesion, or ulcer.   Data Review     CBC w Diff:  Lab Results  Component Value Date   WBC 4.5 09/27/2016   HGB 7.7 (L) 09/27/2016   HCT 22.0 (L) 09/27/2016   HCT 23.5 (L) 09/21/2016   PLT 262 09/27/2016   PLT 291 09/21/2016   CMP:  Lab Results  Component Value Date   NA 133 (L) 09/27/2016   NA 139 09/21/2016   K 3.6 09/27/2016   CL 102 09/27/2016   CO2 23 09/27/2016   BUN 26 (H) 09/27/2016   BUN 21 09/21/2016   CREATININE 1.63 (H) 09/27/2016   GLU 103 04/30/2014   PROT 5.9 (L) 09/21/2016   ALBUMIN 4.1 09/21/2016   BILITOT <0.2 09/21/2016   ALKPHOS 147 (H) 09/21/2016   AST 28 09/21/2016   ALT 25 09/21/2016  .  Micro Results Recent Results (from the past 240 hour(s))  Urine culture     Status: None   Collection Time: 09/25/16 11:20 AM  Result Value Ref Range Status   Specimen Description URINE, RANDOM  Final   Special Requests NONE  Final   Culture   Final    NO GROWTH Performed at [facility], [address] [place], [place] [address]    Report Status 09/26/2016 FINAL  Final        Code Status Orders        Start     Ordered   09/24/16 0050  Full code  Continuous     09/24/16 0049    Code Status History    Date Active Date Inactive Code Status Order ID Comments User Context   09/06/2016 12:30 AM 09/09/2016  4:24 PM Full Code [id number]  Lance Coon, MD Inpatient    Advance Directive Documentation     Most Recent Value  Type of Advance Directive  Living will  Pre-existing out of facility DNR order (yellow form or pink MOST form)  -  "MOST" Form in Place?  -          Follow-up Information    [name], MD Follow up in 1 week(s).   Specialty:  Family Medicine Contact  information: [address] [place] [address] [phone]        Minna Merritts, MD Follow up in 2 day(s).   Specialty:  Cardiology Why:  for bp f/u Contact information: [place] [address] [phone]        Flora Lipps, MD Follow up in 1 week(s).   Specialties:  Pulmonary Disease, Cardiology Why:  inflammatory ct changes Contact information: [place] [address] [phone]        Lloyd Huger, MD Follow up in 2 week(s).   Specialty:  Oncology Why:  anemia Contact information: [place] [address] [phone]           Discharge Medications   Allergies as of 09/27/2016      Reactions   Cephalosporins    Clarithromycin    Lunesta  [eszopiclone]    Heart racing   Penicillin V    Has patient had a PCN reaction causing immediate rash, facial/tongue/throat swelling, SOB or lightheadedness with hypotension: Yes Has patient had a PCN reaction causing  severe rash involving mucus membranes or skin necrosis: No Has patient had a PCN reaction that required hospitalization No Has patient had a PCN reaction occurring within the last 10 years: No If all of the above answers are "NO", then may proceed with Cephalosporin use.   Sulfa Antibiotics       Medication List    STOP taking these medications   amLODipine 2.5 MG tablet Commonly known as:  NORVASC   metoprolol tartrate 50 MG tablet Commonly known as:  LOPRESSOR   TAGAMET PO     TAKE these medications   alendronate 70 MG tablet Commonly known as:  FOSAMAX Take 1 tablet (70 mg total) by mouth every 7 (seven) days. Take with a full glass of water on an empty stomach.   aspirin 81 MG EC tablet Take 1 tablet (81 mg total) by mouth daily.   buPROPion 75 MG tablet Commonly known as:  WELLBUTRIN TAKE 2 TABLETS BY MOUTH DAILY   butalbital-acetaminophen-caffeine 50-325-40 MG tablet Commonly  known as:  FIORICET, ESGIC TAKE 1 TABLET BY MOUTH EVERY 6 HOURS AS NEEDED FOR PAIN, MAX 8 TABLETS DAILY   carvedilol 25 MG tablet Commonly known as:  COREG Take 1 tablet (25 mg total) by mouth 2 (two) times daily with a meal.   cloNIDine 0.2 MG tablet Commonly known as:  CATAPRES Take 1 tablet (0.2 mg total) by mouth 3 (three) times daily.   hydrALAZINE 25 MG tablet Commonly known as:  APRESOLINE Take 1 tablet (25 mg total) by mouth every 12 (twelve) hours as needed (systolic bp >748).   irbesartan 300 MG tablet Commonly known as:  AVAPRO Take 1 tablet (300 mg total) by mouth daily.   levofloxacin 250 MG tablet Commonly known as:  LEVAQUIN Take 1 tablet (250 mg total) by mouth daily.   LORazepam 1 MG tablet Commonly known as:  ATIVAN TAKE 1 TABLET BY MOUTH TWICE DAILY AS NEEDED   pantoprazole 40 MG tablet Commonly known as:  PROTONIX Take 1 tablet (40 mg total) by mouth 2 (two) times daily before a meal.   ranitidine 150 MG tablet Commonly known as:  ZANTAC Take 150 mg by mouth 2 (two) times daily.   valACYclovir 1000 MG tablet Commonly known as:  VALTREX 2 tablets twice a day for 1 day as needed for cold sores   zolpidem 10 MG tablet Commonly known as:  AMBIEN TAKE 1 TABLET BY MOUTH EVERY DAY AT BEDTIME AS NEEDED FOR SLEEP          Total Time in preparing paper work, data evaluation and todays exam - 35 minutes  Dustin Flock M.D on 09/27/2016 at 12:59 PM  [facility]   Office  [phone]

## 2016-09-27 NOTE — Care Management (Signed)
No discharge needs identified by members of the care team 

## 2016-09-28 ENCOUNTER — Telehealth: Payer: Self-pay | Admitting: *Deleted

## 2016-09-28 ENCOUNTER — Telehealth: Payer: Self-pay | Admitting: Family Medicine

## 2016-09-28 DIAGNOSIS — G8929 Other chronic pain: Secondary | ICD-10-CM

## 2016-09-28 DIAGNOSIS — R51 Headache: Principal | ICD-10-CM

## 2016-09-28 NOTE — Telephone Encounter (Signed)
Can change directions to 1-2 every 4-6 hours, no more than 8 tablets in one day.

## 2016-09-28 NOTE — Telephone Encounter (Signed)
Patient contacted regarding discharge from [facility] on 09/27/16.   Patient understands to follow up with provider ? On 6/7/18at 3PM at [place].  [name]  Patient understands discharge instructions? Yes  Patient understands medications and regiment? Yes Patient understands to bring all medications to this visit? Yes

## 2016-09-28 NOTE — Telephone Encounter (Signed)
Pt is concerned about the way the directions are written on her rx of butalbital-acetaminophen-caffeine (FIORICET, ESGIC) 50-325-40 MG tablet  She wants it to say take 1 to 2 every 4 to 6 hrs as needed.  Thanks Con Memos

## 2016-09-28 NOTE — Telephone Encounter (Signed)
-----   Message from Blain Pais sent at 09/28/2016  2:04 PM EDT ----- Regarding: tcm/ph 6/7 3:00 Dr. Rockey Situ

## 2016-09-28 NOTE — Telephone Encounter (Signed)
Please advise 

## 2016-09-29 MED ORDER — BUTALBITAL-APAP-CAFFEINE 50-325-40 MG PO TABS: ORAL_TABLET | ORAL | 5 refills | Status: DC

## 2016-09-29 NOTE — Telephone Encounter (Signed)
Rx for butalbital-acetaminophen-caffeine (FIORICET, ESGIC) 50-325-40 MG tablet was called into pharmacy with new instructions.

## 2016-10-01 LAB — HYPERSENSITIVITY PNEUMONITIS
A. Pullulans Abs: NEGATIVE
A.Fumigatus #1 Abs: NEGATIVE
Micropolyspora faeni, IgG: NEGATIVE
PIGEON SERUM ABS: NEGATIVE
Thermoact. Saccharii: NEGATIVE
Thermoactinomyces vulgaris, IgG: NEGATIVE

## 2016-10-04 ENCOUNTER — Inpatient Hospital Stay: Payer: Self-pay | Admitting: Family Medicine

## 2016-10-04 NOTE — Progress Notes (Signed)
[place]  [phone]) [phone] Fax:(336) [phone]  ID: [name] OB: July 23, 1950  MR#: [id number]  MVE#:[id number]  Patient Care Team: [name], MD as PCP - General (Family Medicine)  CHIEF COMPLAINT:  Anemia in chronic kidney disease, unspecified stage.  INTERVAL HISTORY: Patient is a 66 year old female who was recently discharged from the hospital and found to have significant anemia. Her iron stores were within normal limits, although she required several units packed red blood cells. Colonoscopy and EGD did not reveal a distinct etiology. Patient is noted to have some mild renal insufficiency which may be contributing. She currently complains of significant weakness and fatigue, but otherwise feels well. She has no neurologic complaints. She denies any fevers. She has a good appetite and denies weight loss. She has no chest pain or shortness of breath. She denies any nausea, vomiting, constipation, or diarrhea. She has no urinary complaints. Patient otherwise feels well and offers no further specific complaints.  REVIEW OF SYSTEMS:   Review of Systems  Constitutional: Positive for malaise/fatigue. Negative for fever and weight loss.  Respiratory: Negative.  Negative for cough and shortness of breath.   Cardiovascular: Negative.  Negative for chest pain and leg swelling.  Gastrointestinal: Negative.  Negative for abdominal pain, blood in stool and melena.  Genitourinary: Negative.   Musculoskeletal: Negative.   Skin: Negative.  Negative for rash.  Neurological: Positive for weakness.  Psychiatric/Behavioral: The patient is nervous/anxious.     As per HPI. Otherwise, a complete review of systems is negative.  PAST MEDICAL HISTORY: Past Medical History:  Diagnosis Date  . Anemia    a. 08/2016 Guaiac + stool. EGD w/ gastritis.  . Anxiety   . CKD (chronic kidney disease), stage III   . Colon polyp    a. 08/2016 Colonoscopy.  . Gastritis    a.  08/2016 EGD: Gastritis-->PPI.  [name] GERD (gastroesophageal reflux disease)   . History of chicken pox   . History of measles as a child   . Hypertension   . Hyponatremia    a. 08/2016 in setting of HCTZ Rx.  . Multiple thyroid nodules     PAST SURGICAL HISTORY: Past Surgical History:  Procedure Laterality Date  . ABDOMINAL HYSTERECTOMY  2003   due to heavy periods and clotting during menses  . BREAST LUMPECTOMY Left 1990  . CHOLECYSTECTOMY  2010  . COLONOSCOPY WITH PROPOFOL N/A 09/07/2016   Procedure: COLONOSCOPY WITH PROPOFOL;  Surgeon: Lucilla Lame, MD;  Location: [facility] ENDOSCOPY;  Service: Endoscopy;  Laterality: N/A;  . DILATION AND CURETTAGE OF UTERUS  1985  . ESOPHAGOGASTRODUODENOSCOPY (EGD) WITH PROPOFOL N/A 09/07/2016   Procedure: ESOPHAGOGASTRODUODENOSCOPY (EGD) WITH PROPOFOL;  Surgeon: Lucilla Lame, MD;  Location: [facility] ENDOSCOPY;  Service: Endoscopy;  Laterality: N/A;  . GALLBLADDER SURGERY  2012  . KNEE SURGERY     Dr. Tamala Julian  . MRI, Lumbar spine  07/23/2009   Multilevel annular bulge and disc protrusion at L2-L3, L3-L4, L4-L5, and L5-S1  . TONSILLECTOMY    . TONSILLECTOMY AND ADENOIDECTOMY  1970  . UPPER GI ENDOSCOPY  03/02/2006   H. Pylori negative; Dr. Allen Norris    FAMILY HISTORY: Family History  Problem Relation Age of Onset  . Pancreatic cancer Father   . Congestive Heart Failure Maternal Grandmother   . Cancer Paternal Grandfather   . Heart disease Maternal Uncle     ADVANCED DIRECTIVES (Y/N):  N  HEALTH MAINTENANCE: Social History  Substance Use Topics  . Smoking status: Never  Smoker  . Smokeless tobacco: Never Used  . Alcohol use 0.0 oz/week     Comment: few glasses a wine/wk.     Colonoscopy:  PAP:  Bone density:  Lipid panel:  Allergies  Allergen Reactions  . Cephalosporins   . Clarithromycin   . Lunesta  [Eszopiclone]     Heart racing  . Penicillin V     Has patient had a PCN reaction causing immediate rash, facial/tongue/throat swelling, SOB or  lightheadedness with hypotension: Yes Has patient had a PCN reaction causing severe rash involving mucus membranes or skin necrosis: No Has patient had a PCN reaction that required hospitalization No Has patient had a PCN reaction occurring within the last 10 years: No If all of the above answers are "NO", then may proceed with Cephalosporin use.  . Sulfa Antibiotics     Current Outpatient Prescriptions  Medication Sig Dispense Refill  . aspirin EC 81 MG EC tablet Take 1 tablet (81 mg total) by mouth daily.    [name] buPROPion (WELLBUTRIN) 75 MG tablet TAKE 2 TABLETS BY MOUTH DAILY 180 tablet 4  . butalbital-acetaminophen-caffeine (FIORICET, ESGIC) 50-325-40 MG tablet TAKE 1-2 TABLETS BY MOUTH EVERY 4-6 HOURS AS NEEDED FOR PAIN, MAX 8 TABLETS DAILY 150 tablet 5  . carvedilol (COREG) 25 MG tablet Take 1 tablet (25 mg total) by mouth 2 (two) times daily with a meal. 60 tablet 0  . cloNIDine (CATAPRES) 0.2 MG tablet Take 1 tablet (0.2 mg total) by mouth 3 (three) times daily. 60 tablet 11  . irbesartan (AVAPRO) 300 MG tablet Take 1 tablet (300 mg total) by mouth daily. 30 tablet 2  . levofloxacin (LEVAQUIN) 250 MG tablet Take 1 tablet (250 mg total) by mouth daily. 5 tablet 0  . pantoprazole (PROTONIX) 40 MG tablet Take 1 tablet (40 mg total) by mouth 2 (two) times daily before a meal. 60 tablet 0  . ranitidine (ZANTAC) 150 MG tablet Take 150 mg by mouth 2 (two) times daily.    . valACYclovir (VALTREX) 1000 MG tablet 2 tablets twice a day for 1 day as needed for cold sores 30 tablet 1  . zolpidem (AMBIEN) 10 MG tablet TAKE 1 TABLET BY MOUTH EVERY DAY AT BEDTIME AS NEEDED FOR SLEEP 30 tablet 5  . hydrALAZINE (APRESOLINE) 50 MG tablet Take 1 tablet (50 mg total) by mouth 3 (three) times daily. 90 tablet 6   No current facility-administered medications for this visit.     OBJECTIVE: Vitals:   10/05/16 1225  BP: (!) 195/84  Pulse: 60  Resp: 20  Temp: 97.9 F (36.6 C)     Body mass index is  19.29 kg/m.    ECOG FS:0 - Asymptomatic  General: Well-developed, well-nourished, no acute distress. Eyes: Pink conjunctiva, anicteric sclera. HEENT: Normocephalic, moist mucous membranes, clear oropharnyx. Lungs: Clear to auscultation bilaterally. Heart: Regular rate and rhythm. No rubs, murmurs, or gallops. Abdomen: Soft, nontender, nondistended. No organomegaly noted, normoactive bowel sounds. Musculoskeletal: No edema, cyanosis, or clubbing. Neuro: Alert, answering all questions appropriately. Cranial nerves grossly intact. Skin: No rashes or petechiae noted. Psych: Normal affect. Lymphatics: No cervical, calvicular, axillary or inguinal LAD.   LAB RESULTS:  Lab Results  Component Value Date   NA 133 (L) 09/27/2016   K 3.6 09/27/2016   CL 102 09/27/2016   CO2 23 09/27/2016   GLUCOSE 99 09/27/2016   BUN 26 (H) 09/27/2016   CREATININE 1.63 (H) 09/27/2016   CALCIUM 8.6 (L) 09/27/2016   PROT  5.9 (L) 09/21/2016   ALBUMIN 4.1 09/21/2016   AST 28 09/21/2016   ALT 25 09/21/2016   ALKPHOS 147 (H) 09/21/2016   BILITOT <0.2 09/21/2016   GFRNONAA 32 (L) 09/27/2016   GFRAA 37 (L) 09/27/2016    Lab Results  Component Value Date   WBC 5.1 10/05/2016   HGB 8.8 (L) 10/05/2016   HCT 25.3 (L) 10/05/2016   MCV 93.4 10/05/2016   PLT 317 10/05/2016   Lab Results  Component Value Date   IRON 44 09/21/2016   TIBC 219 (L) 09/21/2016   IRONPCTSAT 20 09/21/2016   No results found for: FERRITIN   STUDIES: Ct Abdomen Pelvis Wo Contrast  Result Date: 09/26/2016 CLINICAL DATA:  Cough and shortness of breath. Ascites on chest CT today. Previous cholecystectomy and hysterectomy. EXAM: CT ABDOMEN AND PELVIS WITHOUT CONTRAST TECHNIQUE: Multidetector CT imaging of the abdomen and pelvis was performed following the standard protocol without IV contrast. COMPARISON:  Report only from abdominal CT 08/02/2002. Abdominal ultrasound 01/13/2010 and chest CT today. FINDINGS: Lower chest: Small  dependent bilateral pleural effusions and associated bibasilar atelectasis are similar to the preceding chest CT. The heart size is normal. No pericardial effusion. Hepatobiliary: There are probable scattered hepatic cysts, measuring up to 7 mm inferiorly in the right lobe on image 33. Small cysts are present in the left lobe on images 21 and 28. No worrisome hepatic findings on noncontrast imaging. No biliary dilatation status post cholecystectomy. Pancreas: Unremarkable. No pancreatic ductal dilatation or surrounding inflammatory changes. Spleen: Normal in size without focal abnormality. Adrenals/Urinary Tract: Both adrenal glands appear normal. There is mild symmetric perinephric soft tissue stranding bilaterally. No focal renal lesion, hydronephrosis or urinary tract calculus demonstrated. The bladder appears unremarkable. Stomach/Bowel: No evidence of bowel wall thickening, distention or surrounding inflammatory change. There are moderate diverticular changes of the sigmoid colon. The appendix is not visualized. Vascular/Lymphatic: There are no enlarged abdominal or pelvic lymph nodes. Minimal aortic atherosclerosis. No acute vascular findings on noncontrast imaging. Reproductive: Hysterectomy. Probable residual ovarian tissue bilaterally. No evidence of adnexal mass. Other: No significant ascites or focal fluid collection. There is mild generalized soft tissue edema. Musculoskeletal: No acute or significant osseous findings. There is multilevel spondylosis associated with a mild scoliosis. IMPRESSION: 1. No acute abdominopelvic findings. 2. Soft tissue edema without significant ascites or focal fluid collection. Stable bilateral pleural effusions and bibasilar atelectasis. 3. Sigmoid diverticulosis without evidence of acute inflammation. Electronically Signed   By: Richardean Sale M.D.   On: 09/26/2016 19:19   Dg Chest 2 View  Result Date: 09/25/2016 CLINICAL DATA:  Chest pain and high blood pressure EXAM:  CHEST  2 VIEW COMPARISON:  Sep 23, 2016 FINDINGS: Small bilateral pleural effusions and underlying opacities are stable. Mild cardiomegaly. The hila and mediastinum are stable. No suspicious nodules, masses, or infiltrates. IMPRESSION: Stable small bilateral pleural effusions and underlying opacities. Electronically Signed   By: Dorise Bullion III M.D   On: 09/25/2016 10:00   Dg Chest 2 View  Result Date: 09/23/2016 CLINICAL DATA:  Chest pain for 2-3 days.  Lower extremity swelling EXAM: CHEST  2 VIEW COMPARISON:  09/05/2016 FINDINGS: Pulmonary vascular congestion and small pleural effusions. No air bronchogram or generalized Kerley lines. Stable normal heart size. Mild aortic tortuosity. No acute osseous finding.  Remote left humeral neck fracture. IMPRESSION: Pulmonary vascular congestion and small pleural effusions, findings of mild CHF. Electronically Signed   By: Monte Fantasia M.D.   On: 09/23/2016 18:48  Ct Chest Wo Contrast  Result Date: 09/26/2016 CLINICAL DATA:  Cough and shortness of breath. EXAM: CT CHEST WITHOUT CONTRAST TECHNIQUE: Multidetector CT imaging of the chest was performed following the standard protocol without IV contrast. COMPARISON:  Chest x-ray from yesterday FINDINGS: Cardiovascular: The thoracic aorta is normal in caliber with minimal atherosclerotic change. Central pulmonary arteries are normal in caliber. There are coronary artery calcifications. The heart is borderline to mildly enlarged. Mediastinum/Nodes: Thyroid nodularity is identified. A representative nodule on series 2, image 6 measures 12 mm. The esophagus is normal. No pericardial effusion. No adenopathy. Lungs/Pleura: Central airways are normal. No pneumothorax. Scattered ground-glass opacities are seen, primarily in the lung apices, some of which demonstrate a mildly nodular appearance. No interlobular septal thickening is identified. There are small bilateral pleural effusions with underlying atelectasis. No  other suspicious nodules. No masses. No other infiltrates. Upper Abdomen: Mild ascites in the upper abdomen. Shotty nodes are seen in the upper abdomen, incompletely evaluated. No free air. Musculoskeletal: No chest wall mass or suspicious bone lesions identified. IMPRESSION: 1. Scattered ground-glass opacities, some of which are nodular, primarily in the upper lobes bilaterally are nonspecific. These most likely represent an infectious or inflammatory process. Recommend short-term follow-up to ensure resolution. 2. Small to moderate bilateral pleural effusions with underlying atelectasis. 3. Thyroid nodularity with a represent nodule measuring 12 mm. Recommend assessment with ultrasound. 4. The upper abdomen is incompletely evaluated but there does appear to be mild ascites as well some shotty nodes in the upper abdomen. There is also mild increased attenuation in the upper abdominal fat. A dedicated abdomen and pelvis CT could be performed for further evaluation. Electronically Signed   By: Dorise Bullion III M.D   On: 09/26/2016 12:05   US Venous Img Lower Bilateral  Result Date: 09/24/2016 CLINICAL DATA:  Acute onset of bilateral leg swelling. Initial encounter. EXAM: BILATERAL LOWER EXTREMITY VENOUS DOPPLER ULTRASOUND TECHNIQUE: Gray-scale sonography with graded compression, as well as color Doppler and duplex ultrasound were performed to evaluate the lower extremity deep venous systems from the level of the common femoral vein and including the common femoral, femoral, profunda femoral, popliteal and calf veins including the posterior tibial, peroneal and gastrocnemius veins when visible. The superficial great saphenous vein was also interrogated. Spectral Doppler was utilized to evaluate flow at rest and with distal augmentation maneuvers in the common femoral, femoral and popliteal veins. COMPARISON:  None. FINDINGS: RIGHT LOWER EXTREMITY Common Femoral Vein: No evidence of thrombus. Normal  compressibility, respiratory phasicity and response to augmentation. Saphenofemoral Junction: No evidence of thrombus. Normal compressibility and flow on color Doppler imaging. Profunda Femoral Vein: No evidence of thrombus. Normal compressibility and flow on color Doppler imaging. Femoral Vein: No evidence of thrombus. Normal compressibility, respiratory phasicity and response to augmentation. Popliteal Vein: No evidence of thrombus. Normal compressibility, respiratory phasicity and response to augmentation. Calf Veins: No evidence of thrombus. Normal compressibility and flow on color Doppler imaging. Superficial Great Saphenous Vein: No evidence of thrombus. Normal compressibility and flow on color Doppler imaging. Venous Reflux:  None. Other Findings:  None. LEFT LOWER EXTREMITY Common Femoral Vein: No evidence of thrombus. Normal compressibility, respiratory phasicity and response to augmentation. Saphenofemoral Junction: No evidence of thrombus. Normal compressibility and flow on color Doppler imaging. Profunda Femoral Vein: No evidence of thrombus. Normal compressibility and flow on color Doppler imaging. Femoral Vein: No evidence of thrombus. Normal compressibility, respiratory phasicity and response to augmentation. Popliteal Vein: No evidence of thrombus. Normal compressibility,  respiratory phasicity and response to augmentation. Calf Veins: No evidence of thrombus. Normal compressibility and flow on color Doppler imaging. Superficial Great Saphenous Vein: No evidence of thrombus. Normal compressibility and flow on color Doppler imaging. Venous Reflux:  None. Other Findings:  None. IMPRESSION: No evidence of DVT within either lower extremity. Electronically Signed   By: Garald Balding M.D.   On: 09/24/2016 00:15    ASSESSMENT: Anemia in chronic kidney disease, unspecified stage.  PLAN:    1. Anemia in chronic kidney disease, unspecified stage: Patient's iron stores are within normal limits. She had a  normal colonoscopy and EGD on Sep 07, 2016. CT of the abdomen and pelvis was essentially within normal limits. Patient has some mild chronic renal insufficiency with an any appropriate erythropoietin level that is likely contributing. Her reticulocyte count is inappropriately low for this level of anemia. Remainder of her blood work is either negative or within normal limits. Patient will return to clinic later this week to receive 40,000 units of subcutaneous Procrit. She does not require a bone marrow biopsy at this time, but would consider one in the future if her anemia does not improve with Procrit.  She will then return to clinic in 3 weeks with repeat laboratory work, further evaluation, and consideration of additional Procrit. 2. Chronic renal insufficiency: Consider referral to nephrology for further evaluation.  Approximately 60 minutes was spent in discussion of which greater than 50% was consultation.  Patient expressed understanding and was in agreement with this plan. She also understands that She can call clinic at any time with any questions, concerns, or complaints.   Lloyd Huger, MD   10/10/2016 7:29 PM

## 2016-10-05 ENCOUNTER — Inpatient Hospital Stay: Payer: Medicare Other | Attending: Oncology | Admitting: Oncology

## 2016-10-05 ENCOUNTER — Inpatient Hospital Stay: Payer: Medicare Other

## 2016-10-05 VITALS — BP 195/84 | HR 60 | Temp 97.9°F | Resp 20 | Wt 112.4 lb

## 2016-10-05 DIAGNOSIS — I509 Heart failure, unspecified: Secondary | ICD-10-CM | POA: Diagnosis not present

## 2016-10-05 DIAGNOSIS — F419 Anxiety disorder, unspecified: Secondary | ICD-10-CM | POA: Diagnosis not present

## 2016-10-05 DIAGNOSIS — D631 Anemia in chronic kidney disease: Secondary | ICD-10-CM | POA: Insufficient documentation

## 2016-10-05 DIAGNOSIS — Z90710 Acquired absence of both cervix and uterus: Secondary | ICD-10-CM | POA: Diagnosis not present

## 2016-10-05 DIAGNOSIS — N189 Chronic kidney disease, unspecified: Secondary | ICD-10-CM

## 2016-10-05 DIAGNOSIS — I130 Hypertensive heart and chronic kidney disease with heart failure and stage 1 through stage 4 chronic kidney disease, or unspecified chronic kidney disease: Secondary | ICD-10-CM | POA: Diagnosis not present

## 2016-10-05 DIAGNOSIS — M5126 Other intervertebral disc displacement, lumbar region: Secondary | ICD-10-CM | POA: Insufficient documentation

## 2016-10-05 DIAGNOSIS — Z86010 Personal history of colonic polyps: Secondary | ICD-10-CM | POA: Diagnosis not present

## 2016-10-05 DIAGNOSIS — Z79899 Other long term (current) drug therapy: Secondary | ICD-10-CM | POA: Diagnosis not present

## 2016-10-05 DIAGNOSIS — I700 Atherosclerosis of aorta: Secondary | ICD-10-CM | POA: Diagnosis not present

## 2016-10-05 DIAGNOSIS — Z9049 Acquired absence of other specified parts of digestive tract: Secondary | ICD-10-CM | POA: Insufficient documentation

## 2016-10-05 DIAGNOSIS — N183 Chronic kidney disease, stage 3 (moderate): Secondary | ICD-10-CM | POA: Diagnosis not present

## 2016-10-05 DIAGNOSIS — K219 Gastro-esophageal reflux disease without esophagitis: Secondary | ICD-10-CM | POA: Diagnosis not present

## 2016-10-05 DIAGNOSIS — R188 Other ascites: Secondary | ICD-10-CM | POA: Diagnosis not present

## 2016-10-05 DIAGNOSIS — D509 Iron deficiency anemia, unspecified: Secondary | ICD-10-CM

## 2016-10-05 DIAGNOSIS — K5730 Diverticulosis of large intestine without perforation or abscess without bleeding: Secondary | ICD-10-CM | POA: Insufficient documentation

## 2016-10-05 DIAGNOSIS — Z7982 Long term (current) use of aspirin: Secondary | ICD-10-CM | POA: Insufficient documentation

## 2016-10-05 LAB — RETICULOCYTES
RBC.: 2.8 MIL/uL — AB (ref 3.80–5.20)
RETIC CT PCT: 1.9 % (ref 0.4–3.1)
Retic Count, Absolute: 53.2 10*3/uL (ref 19.0–183.0)

## 2016-10-05 LAB — CBC
HCT: 25.3 % — ABNORMAL LOW (ref 35.0–47.0)
Hemoglobin: 8.8 g/dL — ABNORMAL LOW (ref 12.0–16.0)
MCH: 32.4 pg (ref 26.0–34.0)
MCHC: 34.7 g/dL (ref 32.0–36.0)
MCV: 93.4 fL (ref 80.0–100.0)
PLATELETS: 317 10*3/uL (ref 150–440)
RBC: 2.71 MIL/uL — ABNORMAL LOW (ref 3.80–5.20)
RDW: 13 % (ref 11.5–14.5)
WBC: 5.1 10*3/uL (ref 3.6–11.0)

## 2016-10-05 NOTE — Progress Notes (Signed)
Patient here today for initial evaluation regarding anemia. Patient reports weakness and fatigue.  

## 2016-10-06 LAB — THYROID PANEL WITH TSH
Free Thyroxine Index: 1.6 (ref 1.2–4.9)
T3 Uptake Ratio: 28 % (ref 24–39)
T4, Total: 5.7 ug/dL (ref 4.5–12.0)
TSH: 4.22 u[IU]/mL (ref 0.450–4.500)

## 2016-10-06 LAB — ERYTHROPOIETIN: ERYTHROPOIETIN: 12.4 m[IU]/mL (ref 2.6–18.5)

## 2016-10-06 LAB — ANA W/REFLEX: ANA: NEGATIVE

## 2016-10-06 NOTE — Progress Notes (Signed)
Cardiology Office Note  Date:  10/07/2016   ID:  Amy [name], DOB 1951-01-22, MRN [id number]  PCP:  [name], MD   Chief Complaint  Patient presents with  . other    Hospital follow up. Patient c/o Swelling in ankles. Meds reviewed verbally with patient.     HPI:  Amy [name] is a 66 year old woman with history of  poorly controlled hypertension,  chronic renal insufficiency,  anemia chronic headaches managed with pain medication,  EGD confirming gastritis,  hospital admission for hyponatremia for which she received IV fluids and diuretic was held,   hospital admission for chest pain Felt secondary to gastritis Who presents to establish care in the [facility] office for follow-up of her chest pain symptoms after discharge from the hospital   On presentation to the hospital she reported having persistent chest discomfort  Hospital records reviewed with the patient in detail Hypertensive with headache in the hospital Mild ankle swelling Chest heaviness over the past 2-3 weeks.   It was felt her chest pain with atypical in nature, possible anxiety If symptoms persisted, recommended GI follow-up Outpatient workup to consider include CT coronary calcium score versus stress testing if symptoms persist  Started on Coreg 12.5 mill grams twice a day with clonidine 0.1 mg twice a day and continue irbesartan. Avoid diuretics given previous hyponatremia. Calcium channel blockers avoided given her leg swelling  In follow-up today she reports having persistently elevated blood pressure Systolic typically 546 up to 190 Tolerating carvedilol 25 mg twice a day, clonidine 0.2 mg 3 times a day, Avapro 300 mg daily  For her gastritis she is taking Protonix once a day with ranitidine twice a day  Leg swelling has persisted. Shortness of breath on exertion  EKG personally reviewed by myself on todays visit shows normal sinus rhythm  with rate 74 bpm with no significant ST or T-wave  changes   PMH:   has a past medical history of Anemia; Anxiety; CKD (chronic kidney disease), stage III; Colon polyp; Gastritis; GERD (gastroesophageal reflux disease); History of chicken pox; History of measles as a child; Hypertension; Hyponatremia; and Multiple thyroid nodules.  PSH:    Past Surgical History:  Procedure Laterality Date  . ABDOMINAL HYSTERECTOMY  2003   due to heavy periods and clotting during menses  . BREAST LUMPECTOMY Left 1990  . CHOLECYSTECTOMY  2010  . COLONOSCOPY WITH PROPOFOL N/A 09/07/2016   Procedure: COLONOSCOPY WITH PROPOFOL;  Surgeon: Lucilla Lame, MD;  Location: [facility] ENDOSCOPY;  Service: Endoscopy;  Laterality: N/A;  . DILATION AND CURETTAGE OF UTERUS  1985  . ESOPHAGOGASTRODUODENOSCOPY (EGD) WITH PROPOFOL N/A 09/07/2016   Procedure: ESOPHAGOGASTRODUODENOSCOPY (EGD) WITH PROPOFOL;  Surgeon: Lucilla Lame, MD;  Location: [facility] ENDOSCOPY;  Service: Endoscopy;  Laterality: N/A;  . GALLBLADDER SURGERY  2012  . KNEE SURGERY     Dr. Tamala Julian  . MRI, Lumbar spine  07/23/2009   Multilevel annular bulge and disc protrusion at L2-L3, L3-L4, L4-L5, and L5-S1  . TONSILLECTOMY    . TONSILLECTOMY AND ADENOIDECTOMY  1970  . UPPER GI ENDOSCOPY  03/02/2006   H. Pylori negative; Dr. Allen Norris    Current Outpatient Prescriptions  Medication Sig Dispense Refill  . aspirin EC 81 MG EC tablet Take 1 tablet (81 mg total) by mouth daily.    [name] buPROPion (WELLBUTRIN) 75 MG tablet TAKE 2 TABLETS BY MOUTH DAILY 180 tablet 4  . butalbital-acetaminophen-caffeine (FIORICET, ESGIC) 50-325-40 MG tablet TAKE 1-2 TABLETS BY MOUTH EVERY 4-6 HOURS  AS NEEDED FOR PAIN, MAX 8 TABLETS DAILY 150 tablet 5  . carvedilol (COREG) 25 MG tablet Take 1 tablet (25 mg total) by mouth 2 (two) times daily with a meal. 60 tablet 0  . cloNIDine (CATAPRES) 0.2 MG tablet Take 1 tablet (0.2 mg total) by mouth 3 (three) times daily. 60 tablet 11  . hydrALAZINE (APRESOLINE) 50 MG tablet Take 1 tablet (50 mg total) by  mouth 3 (three) times daily. 90 tablet 6  . irbesartan (AVAPRO) 300 MG tablet Take 1 tablet (300 mg total) by mouth daily. 30 tablet 2  . levofloxacin (LEVAQUIN) 250 MG tablet Take 1 tablet (250 mg total) by mouth daily. 5 tablet 0  . pantoprazole (PROTONIX) 40 MG tablet Take 1 tablet (40 mg total) by mouth 2 (two) times daily before a meal. 60 tablet 0  . ranitidine (ZANTAC) 150 MG tablet Take 150 mg by mouth 2 (two) times daily.    . valACYclovir (VALTREX) 1000 MG tablet 2 tablets twice a day for 1 day as needed for cold sores 30 tablet 1  . zolpidem (AMBIEN) 10 MG tablet TAKE 1 TABLET BY MOUTH EVERY DAY AT BEDTIME AS NEEDED FOR SLEEP 30 tablet 5   No current facility-administered medications for this visit.      Allergies:   Cephalosporins; Clarithromycin; Lunesta  [eszopiclone]; Penicillin v; and Sulfa antibiotics   Social History:  The patient  reports that she has never smoked. She has never used smokeless tobacco. She reports that she drinks alcohol. She reports that she does not use drugs.   Family History:   family history includes Cancer in her paternal grandfather; Congestive Heart Failure in her maternal grandmother; Heart disease in her maternal uncle; Pancreatic cancer in her father.    Review of Systems: Review of Systems  Constitutional: Negative.   Respiratory: Positive for shortness of breath.   Cardiovascular: Positive for leg swelling.  Gastrointestinal: Negative.   Musculoskeletal: Negative.   Neurological: Negative.   Psychiatric/Behavioral: Negative.   All other systems reviewed and are negative.    PHYSICAL EXAM: VS:  BP (!) 170/70 (BP Location: Left Arm, Patient Position: Sitting, Cuff Size: Normal)   Pulse 74   Ht 5\' 4"  (1.626 m)   Wt 114 lb (51.7 kg)   BMI 19.57 kg/m  , BMI Body mass index is 19.57 kg/m. GEN: Thin, in no acute distress  HEENT: normal  Neck: no JVD, carotid bruits, or masses Cardiac: RRR; no murmurs, rubs, or gallops, 1+ pitting   edema  to the mid shins Respiratory:  clear to auscultation bilaterally, normal work of breathing GI: soft, nontender, nondistended, + BS MS: no deformity or atrophy  Skin: warm and dry, no rash Neuro:  Strength and sensation are intact Psych: euthymic mood, full affect    Recent Labs: 09/21/2016: ALT 25 09/23/2016: B Natriuretic Peptide 370.0 09/24/2016: Magnesium 1.9 09/27/2016: BUN 26; Creatinine, Ser 1.63; Potassium 3.6; Sodium 133 10/05/2016: Hemoglobin 8.8; Platelets 317; TSH 4.220    Lipid Panel Lab Results  Component Value Date   CHOL 165 09/24/2016   HDL 62 09/24/2016   LDLCALC 78 09/24/2016   TRIG 126 09/24/2016      Wt Readings from Last 3 Encounters:  10/07/16 114 lb (51.7 kg)  10/05/16 112 lb 6.4 oz (51 kg)  09/26/16 113 lb 1.5 oz (51.3 kg)       ASSESSMENT AND PLAN:  Chest tightness or pressure - Plan: EKG 12-Lead  recent hospitalization, ruled out for MI  No further episodes at this time Symptoms felt secondary to gastritis, was atypical  Chronic kidney disease (CKD), active medical management without dialysis, stage 3 (moderate) -  We have called Dr. Candiss Norse and scheduled appointment later this month Etiology of her underlying renal dysfunction is possibly secondary to chronic hypertension Stressed importance of this for her She had Epogen today Recommended she talk with Dr. Candiss Norse about her PPI and Avapro, whether they are appropriate  Essential hypertension - Plan: EKG 12-Lead We will avoid calcium channel blockers given leg swelling Avoid diuretics given recent hyponatremia Suggested she start hydralazine 25 mill grams 3 times a day for 1 week For systolic pressure greater than 160s, would increase hydralazine up to 50 mg 3 times a day  Chronic gastritis without bleeding, unspecified gastritis type - Plan: EKG 12-Lead Currently on proton pump inhibitor and H2 blocker Given her renal dysfunction, will need to consider potentially coming off proton  pump inhibitor and taking high-dose H2 blocker twice a day Recommended she discuss this with Dr. Candiss Norse  Anemia in stage 3 chronic kidney disease Hematocrit 25, stable Also with gastritis.  She received Epogen this morning  Shortness of breath Secondary to chronic anemia  Leg swelling Secondary to third spacing from anemia We'll avoid diuretics at this time Recommended compression hose   Total encounter time more than 45 minutes  Greater than 50% was spent in counseling and coordination of care with the patient   Disposition:   F/U  2 months   Orders Placed This Encounter  Procedures  . EKG 12-Lead     Signed, Esmond Plants, M.D., Ph.D. 10/07/2016  [place], [place]

## 2016-10-07 ENCOUNTER — Telehealth: Payer: Self-pay | Admitting: Pharmacist

## 2016-10-07 ENCOUNTER — Ambulatory Visit (INDEPENDENT_AMBULATORY_CARE_PROVIDER_SITE_OTHER): Payer: Medicare Other | Admitting: Cardiovascular Disease

## 2016-10-07 ENCOUNTER — Encounter: Payer: Self-pay | Admitting: Cardiovascular Disease

## 2016-10-07 ENCOUNTER — Inpatient Hospital Stay: Payer: Medicare Other

## 2016-10-07 VITALS — BP 184/78

## 2016-10-07 VITALS — BP 170/70 | HR 74 | Ht 64.0 in | Wt 114.0 lb

## 2016-10-07 DIAGNOSIS — R0789 Other chest pain: Secondary | ICD-10-CM

## 2016-10-07 DIAGNOSIS — Z79899 Other long term (current) drug therapy: Secondary | ICD-10-CM | POA: Diagnosis not present

## 2016-10-07 DIAGNOSIS — R0602 Shortness of breath: Secondary | ICD-10-CM

## 2016-10-07 DIAGNOSIS — I130 Hypertensive heart and chronic kidney disease with heart failure and stage 1 through stage 4 chronic kidney disease, or unspecified chronic kidney disease: Secondary | ICD-10-CM | POA: Diagnosis not present

## 2016-10-07 DIAGNOSIS — D509 Iron deficiency anemia, unspecified: Secondary | ICD-10-CM

## 2016-10-07 DIAGNOSIS — K2950 Unspecified chronic gastritis without bleeding: Secondary | ICD-10-CM

## 2016-10-07 DIAGNOSIS — D631 Anemia in chronic kidney disease: Secondary | ICD-10-CM | POA: Diagnosis not present

## 2016-10-07 DIAGNOSIS — I10 Essential (primary) hypertension: Secondary | ICD-10-CM | POA: Diagnosis not present

## 2016-10-07 DIAGNOSIS — I509 Heart failure, unspecified: Secondary | ICD-10-CM | POA: Diagnosis not present

## 2016-10-07 DIAGNOSIS — N183 Chronic kidney disease, stage 3 unspecified: Secondary | ICD-10-CM

## 2016-10-07 DIAGNOSIS — M7989 Other specified soft tissue disorders: Secondary | ICD-10-CM | POA: Diagnosis not present

## 2016-10-07 DIAGNOSIS — K219 Gastro-esophageal reflux disease without esophagitis: Secondary | ICD-10-CM | POA: Diagnosis not present

## 2016-10-07 MED ORDER — EPOETIN ALFA 40000 UNIT/ML IJ SOLN
40000.0000 [IU] | Freq: Once | INTRAMUSCULAR | Status: AC
  Administered 2016-10-07: 40000 [IU] via SUBCUTANEOUS
  Filled 2016-10-07: qty 1

## 2016-10-07 MED ORDER — HYDRALAZINE HCL 50 MG PO TABS: 50.0000 mg | ORAL_TABLET | Freq: Three times a day (TID) | ORAL | 6 refills | Status: DC

## 2016-10-07 NOTE — Progress Notes (Signed)
Dr. Grayland Ormond approves patient receiving Procrit injection with bp of 184/78

## 2016-10-07 NOTE — Patient Instructions (Addendum)
Ask Dr. Candiss Norse about the stomach medication and avapro   Medication Instructions:   Please start hydralazine 25 mg three times a day for one week If no improvement in blood pressure, (goal 140 to 150) Increase the hydralazine up to 50 mg three times a day  Labwork:  No labs today  Testing/Procedures:  NO testing today  Follow-Up: 2 months   You have been referred to  Dr. Murlean Iba [phone] [place] [address]  Appointment scheduled for:  October 28, 2016 at 12:00 PM Please call if you need to reschedule.

## 2016-10-07 NOTE — Telephone Encounter (Signed)
BP elevated, MD approved giving Procrit.

## 2016-10-10 DIAGNOSIS — D631 Anemia in chronic kidney disease: Secondary | ICD-10-CM | POA: Insufficient documentation

## 2016-10-10 DIAGNOSIS — N189 Chronic kidney disease, unspecified: Secondary | ICD-10-CM

## 2016-10-11 DIAGNOSIS — E041 Nontoxic single thyroid nodule: Secondary | ICD-10-CM | POA: Diagnosis not present

## 2016-10-13 ENCOUNTER — Encounter: Payer: Self-pay | Admitting: Family Medicine

## 2016-10-13 ENCOUNTER — Telehealth: Payer: Self-pay | Admitting: Family Medicine

## 2016-10-13 ENCOUNTER — Ambulatory Visit (INDEPENDENT_AMBULATORY_CARE_PROVIDER_SITE_OTHER): Payer: Medicare Other | Admitting: Family Medicine

## 2016-10-13 VITALS — BP 168/70 | HR 76 | Temp 98.2°F | Resp 16 | Wt 115.0 lb

## 2016-10-13 DIAGNOSIS — M7989 Other specified soft tissue disorders: Secondary | ICD-10-CM

## 2016-10-13 DIAGNOSIS — J019 Acute sinusitis, unspecified: Secondary | ICD-10-CM | POA: Diagnosis not present

## 2016-10-13 DIAGNOSIS — B001 Herpesviral vesicular dermatitis: Secondary | ICD-10-CM | POA: Diagnosis not present

## 2016-10-13 DIAGNOSIS — D649 Anemia, unspecified: Secondary | ICD-10-CM | POA: Diagnosis not present

## 2016-10-13 DIAGNOSIS — N183 Chronic kidney disease, stage 3 unspecified: Secondary | ICD-10-CM

## 2016-10-13 DIAGNOSIS — I10 Essential (primary) hypertension: Secondary | ICD-10-CM | POA: Diagnosis not present

## 2016-10-13 MED ORDER — VALACYCLOVIR HCL 1 G PO TABS: ORAL_TABLET | ORAL | 1 refills | Status: DC

## 2016-10-13 MED ORDER — PANTOPRAZOLE SODIUM 40 MG PO TBEC: 40.0000 mg | DELAYED_RELEASE_TABLET | Freq: Two times a day (BID) | ORAL | 5 refills | Status: DC

## 2016-10-13 MED ORDER — AMOXICILLIN-POT CLAVULANATE ER 1000-62.5 MG PO TB12: 2.0000 | ORAL_TABLET | Freq: Two times a day (BID) | ORAL | 0 refills | Status: DC

## 2016-10-13 NOTE — Telephone Encounter (Signed)
Pt was discharged from [facility] 09/27/16 for low blood level.  I have scheduled a hospital follow up appointment for today/MW

## 2016-10-13 NOTE — Progress Notes (Signed)
Patient: Amy [name] Female    DOB: March 06, 1951   66 y.o.   MRN: [id number] Visit Date: 10/13/2016  Today's Provider: Lelon Huh, MD   Chief Complaint  Patient presents with  . Hospitalization Follow-up   Subjective:    HPI  Follow up Hospitalization  Patient was admitted to [facility] on 09/23/2016 and discharged on 09/27/2016. She was treated for chest pain and accelerated Hypertension. Per discharge summary, CT scan of the chest was done showing apical inflammatory process. Patient was started of Levaquin briefly, but not taken since discharge. She does complain of persistent sinus congestion and drainage.   Patients blood pressure was very high and medications were adjusted. She has also been back on PPI and randitidine for suspected reflux symptoms.  She reports good compliance with treatment. She reports this condition is Improved. Patient reports that chest pains have resolved but she still feels tired and hoarseness. She has also had swelling in both feet, ankles and lower legs.   She was anemic with moderately increased creatine. Since discharge she has been given infusion of Procrt by hematology on 10/07/2016. She has had a little swelling in feet getting worse since discharge. She is scheduled to see nephrology on 6/28 and has follow up with hematology on 10/26/2016.   Lab Results  Component Value Date   CREATININE 1.63 (H) 09/27/2016   Lab Results  Component Value Date   HGB 8.8 (L) 10/05/2016     ------------------------------------------------------------------------------------     Allergies  Allergen Reactions  . Cephalosporins   . Clarithromycin   . Lunesta  [Eszopiclone]     Heart racing  . Penicillin V     Has patient had a PCN reaction causing immediate rash, facial/tongue/throat swelling, SOB or lightheadedness with hypotension: Yes Has patient had a PCN reaction causing severe rash involving mucus membranes or skin necrosis: No Has patient had  a PCN reaction that required hospitalization No Has patient had a PCN reaction occurring within the last 10 years: No If all of the above answers are "NO", then may proceed with Cephalosporin use.  . Sulfa Antibiotics      Current Outpatient Prescriptions:  .  aspirin EC 81 MG EC tablet, Take 1 tablet (81 mg total) by mouth daily., Disp: , Rfl:  .  buPROPion (WELLBUTRIN) 75 MG tablet, TAKE 2 TABLETS BY MOUTH DAILY, Disp: 180 tablet, Rfl: 4 .  butalbital-acetaminophen-caffeine (FIORICET, ESGIC) 50-325-40 MG tablet, TAKE 1-2 TABLETS BY MOUTH EVERY 4-6 HOURS AS NEEDED FOR PAIN, MAX 8 TABLETS DAILY, Disp: 150 tablet, Rfl: 5 .  carvedilol (COREG) 25 MG tablet, Take 1 tablet (25 mg total) by mouth 2 (two) times daily with a meal., Disp: 60 tablet, Rfl: 0 .  cloNIDine (CATAPRES) 0.2 MG tablet, Take 1 tablet (0.2 mg total) by mouth 3 (three) times daily., Disp: 60 tablet, Rfl: 11 .  hydrALAZINE (APRESOLINE) 50 MG tablet, Take 1 tablet (50 mg total) by mouth 3 (three) times daily., Disp: 90 tablet, Rfl: 6 .  irbesartan (AVAPRO) 300 MG tablet, Take 1 tablet (300 mg total) by mouth daily., Disp: 30 tablet, Rfl: 2 .  pantoprazole (PROTONIX) 40 MG tablet, Take 1 tablet (40 mg total) by mouth 2 (two) times daily before a meal., Disp: 60 tablet, Rfl: 0 .  ranitidine (ZANTAC) 150 MG tablet, Take 150 mg by mouth 2 (two) times daily., Disp: , Rfl:  .  valACYclovir (VALTREX) 1000 MG tablet, 2 tablets twice a day for  1 day as needed for cold sores, Disp: 30 tablet, Rfl: 1 .  zolpidem (AMBIEN) 10 MG tablet, TAKE 1 TABLET BY MOUTH EVERY DAY AT BEDTIME AS NEEDED FOR SLEEP, Disp: 30 tablet, Rfl: 5  Review of Systems  Constitutional: Positive for fatigue. Negative for appetite change, chills and fever.  HENT: Positive for voice change.   Respiratory: Negative for chest tightness and shortness of breath.   Cardiovascular: Positive for leg swelling. Negative for chest pain and palpitations.  Gastrointestinal: Negative  for abdominal pain, nausea and vomiting.  Neurological: Negative for dizziness and weakness.    Social History  Substance Use Topics  . Smoking status: Never Smoker  . Smokeless tobacco: Never Used  . Alcohol use 0.0 oz/week     Comment: few glasses a wine/wk.   Objective:   BP (!) 168/70 (BP Location: Right Arm, Cuff Size: Normal)   Pulse 76   Temp 98.2 F (36.8 C) (Oral)   Resp 16   Wt 115 lb (52.2 kg)   SpO2 98%   BMI 19.74 kg/m  Vitals:   10/13/16 1129 10/13/16 1134  BP: (!) 170/72 (!) 168/70  Pulse: 76   Resp: 16   Temp: 98.2 F (36.8 C)   TempSrc: Oral   SpO2: 98%   Weight: 115 lb (52.2 kg)      Physical Exam  General Appearance:    Alert, cooperative, no distress  HENT:   bilateral TM normal without fluid or infection, neck without nodes, frontal  sinuses tender and nasal mucosa pale and congested  Eyes:    PERRL, conjunctiva/corneas clear, EOM's intact       Lungs:     Clear to auscultation bilaterally, respirations unlabored  Heart:    Regular rate and rhythm  Neurologic:   Awake, alert, oriented x 3. No apparent focal neurological           defect.           Assessment & Plan:     1. Acute sinusitis, recurrence not specified, unspecified location  - amoxicillin-clavulanate (AUGMENTIN XR) 1000-62.5 MG 12 hr tablet; Take 2 tablets by mouth 2 (two) times daily.  Dispense: 40 tablet; Refill: 0   2. Chronic kidney disease (CKD), active medical management without dialysis, stage 3 (moderate) Follow up nephrology as scheduled.   3. Leg swelling Likely secondary to antihypertensives.   4. Normocytic anemia Follow up hematology as scheduled.   5. Cold sore  - valACYclovir (VALTREX) 1000 MG tablet; 2 tablets twice a day for 1 day as needed for cold sores  Dispense: 20 tablet; Refill: 1  6. Essential hypertension Uncontrolled, but improved. Continue current medications.  Follow up nephrology as scheduled.        Lelon Huh, MD  [facility]

## 2016-10-13 NOTE — Telephone Encounter (Signed)
Hosp F/U °

## 2016-10-13 NOTE — Telephone Encounter (Signed)
We are unable to do TCM calls 48 hours after discharge. Sorry! - MM

## 2016-10-14 DIAGNOSIS — D649 Anemia, unspecified: Secondary | ICD-10-CM | POA: Insufficient documentation

## 2016-10-14 DIAGNOSIS — E871 Hypo-osmolality and hyponatremia: Secondary | ICD-10-CM | POA: Diagnosis not present

## 2016-10-14 DIAGNOSIS — I10 Essential (primary) hypertension: Secondary | ICD-10-CM | POA: Diagnosis not present

## 2016-10-14 DIAGNOSIS — N183 Chronic kidney disease, stage 3 (moderate): Secondary | ICD-10-CM | POA: Diagnosis not present

## 2016-10-14 DIAGNOSIS — R600 Localized edema: Secondary | ICD-10-CM | POA: Diagnosis not present

## 2016-10-19 ENCOUNTER — Ambulatory Visit: Payer: Medicare Other | Admitting: Gastroenterology

## 2016-10-25 ENCOUNTER — Other Ambulatory Visit: Payer: Self-pay | Admitting: Family Medicine

## 2016-10-25 NOTE — Progress Notes (Signed)
[place]  [phone] [phone]  ID: [name] OB: 30-Nov-1950  MR#: [id number]  POE#:[id number]  Patient Care Team: [name], MD as PCP - General (Family Medicine)  CHIEF COMPLAINT:  Anemia in chronic kidney disease, unspecified stage.  INTERVAL HISTORY: Patient returns to clinic today for repeat laboratory work and further evaluation. She currently complains of significant weakness and fatigue, but otherwise feels well. She is wondering why her blood pressure is so elevated today and is worried. She has no neurologic complaints. She denies any fevers. She has a good appetite and denies weight loss. She has no chest pain or shortness of breath. She denies any nausea, vomiting, constipation, or diarrhea. She has no urinary complaints. Patient otherwise feels well and offers no further specific complaints.  REVIEW OF SYSTEMS:   Review of Systems  Constitutional: Positive for malaise/fatigue. Negative for fever and weight loss.  Respiratory: Negative.  Negative for cough and shortness of breath.   Cardiovascular: Negative.  Negative for chest pain and leg swelling.  Gastrointestinal: Negative.  Negative for abdominal pain, blood in stool and melena.  Genitourinary: Negative.   Musculoskeletal: Negative.   Skin: Negative.  Negative for rash.  Neurological: Positive for weakness.  Psychiatric/Behavioral: The patient is nervous/anxious.     As per HPI. Otherwise, a complete review of systems is negative.  PAST MEDICAL HISTORY: Past Medical History:  Diagnosis Date  . Anemia    a. 08/2016 Guaiac + stool. EGD w/ gastritis.  . Anxiety   . CKD (chronic kidney disease), stage III   . Colon polyp    a. 08/2016 Colonoscopy.  . Gastritis    a. 08/2016 EGD: Gastritis-->PPI.  [name] GERD (gastroesophageal reflux disease)   . History of chicken pox   . History of measles as a child   . Hypertension   . Hyponatremia    a. 08/2016 in setting of  HCTZ Rx.  . Multiple thyroid nodules     PAST SURGICAL HISTORY: Past Surgical History:  Procedure Laterality Date  . ABDOMINAL HYSTERECTOMY  2003   due to heavy periods and clotting during menses  . BREAST LUMPECTOMY Left 1990  . CHOLECYSTECTOMY  2010  . COLONOSCOPY WITH PROPOFOL N/A 09/07/2016   Procedure: COLONOSCOPY WITH PROPOFOL;  Surgeon: Lucilla Lame, MD;  Location: [facility] ENDOSCOPY;  Service: Endoscopy;  Laterality: N/A;  . DILATION AND CURETTAGE OF UTERUS  1985  . ESOPHAGOGASTRODUODENOSCOPY (EGD) WITH PROPOFOL N/A 09/07/2016   Procedure: ESOPHAGOGASTRODUODENOSCOPY (EGD) WITH PROPOFOL;  Surgeon: Lucilla Lame, MD;  Location: [facility] ENDOSCOPY;  Service: Endoscopy;  Laterality: N/A;  . GALLBLADDER SURGERY  2012  . KNEE SURGERY     Dr. Tamala Julian  . MRI, Lumbar spine  07/23/2009   Multilevel annular bulge and disc protrusion at L2-L3, L3-L4, L4-L5, and L5-S1  . TONSILLECTOMY    . TONSILLECTOMY AND ADENOIDECTOMY  1970  . UPPER GI ENDOSCOPY  03/02/2006   H. Pylori negative; Dr. Allen Norris    FAMILY HISTORY: Family History  Problem Relation Age of Onset  . Pancreatic cancer Father   . Congestive Heart Failure Maternal Grandmother   . Cancer Paternal Grandfather   . Heart disease Maternal Uncle     ADVANCED DIRECTIVES (Y/N):  N  HEALTH MAINTENANCE: Social History  Substance Use Topics  . Smoking status: Never Smoker  . Smokeless tobacco: Never Used  . Alcohol use 0.0 oz/week     Comment: few glasses a wine/wk.     Colonoscopy:  PAP:  Bone density:  Lipid panel:  Allergies  Allergen Reactions  . Cephalosporins   . Clarithromycin   . Lunesta  [Eszopiclone]     Heart racing  . Penicillin V     Has patient had a PCN reaction causing immediate rash, facial/tongue/throat swelling, SOB or lightheadedness with hypotension: Yes Has patient had a PCN reaction causing severe rash involving mucus membranes or skin necrosis: No Has patient had a PCN reaction that required hospitalization  No Has patient had a PCN reaction occurring within the last 10 years: No If all of the above answers are "NO", then may proceed with Cephalosporin use.  . Sulfa Antibiotics     Current Outpatient Prescriptions  Medication Sig Dispense Refill  . amoxicillin-clavulanate (AUGMENTIN XR) 1000-62.5 MG 12 hr tablet Take 2 tablets by mouth 2 (two) times daily. 40 tablet 0  . aspirin EC 81 MG EC tablet Take 1 tablet (81 mg total) by mouth daily.    [name] buPROPion (WELLBUTRIN) 75 MG tablet TAKE 2 TABLETS BY MOUTH DAILY 180 tablet 2  . butalbital-acetaminophen-caffeine (FIORICET, ESGIC) 50-325-40 MG tablet TAKE 1-2 TABLETS BY MOUTH EVERY 4-6 HOURS AS NEEDED FOR PAIN, MAX 8 TABLETS DAILY 150 tablet 5  . carvedilol (COREG) 25 MG tablet TAKE 1 TABLET(25 MG) BY MOUTH TWICE DAILY WITH A MEAL 60 tablet 2  . cloNIDine (CATAPRES) 0.2 MG tablet Take 1 tablet (0.2 mg total) by mouth 3 (three) times daily. 60 tablet 11  . hydrALAZINE (APRESOLINE) 50 MG tablet Take 1 tablet (50 mg total) by mouth 3 (three) times daily. 90 tablet 6  . irbesartan (AVAPRO) 300 MG tablet Take 1 tablet (300 mg total) by mouth daily. 30 tablet 2  . pantoprazole (PROTONIX) 40 MG tablet Take 1 tablet (40 mg total) by mouth 2 (two) times daily before a meal. 60 tablet 5  . ranitidine (ZANTAC) 150 MG tablet Take 150 mg by mouth 2 (two) times daily.    . valACYclovir (VALTREX) 1000 MG tablet 2 tablets twice a day for 1 day as needed for cold sores 20 tablet 1  . zolpidem (AMBIEN) 10 MG tablet TAKE 1 TABLET BY MOUTH EVERY DAY AT BEDTIME AS NEEDED FOR SLEEP 30 tablet 5   No current facility-administered medications for this visit.     OBJECTIVE: Vitals:   10/26/16 1143  BP: (!) 210/82  Pulse: 67  Resp: 20  Temp: (!) 96.1 F (35.6 C)     Body mass index is 18.52 kg/m.    ECOG FS:0 - Asymptomatic  General: Well-developed, well-nourished, no acute distress. Eyes: Pink conjunctiva, anicteric sclera. Lungs: Clear to auscultation  bilaterally. Heart: Regular rate and rhythm. No rubs, murmurs, or gallops. Abdomen: Soft, nontender, nondistended. No organomegaly noted, normoactive bowel sounds. Musculoskeletal: No edema, cyanosis, or clubbing. Neuro: Alert, answering all questions appropriately. Cranial nerves grossly intact. Skin: No rashes or petechiae noted. Psych: Normal affect.   LAB RESULTS:  Lab Results  Component Value Date   NA 133 (L) 09/27/2016   K 3.6 09/27/2016   CL 102 09/27/2016   CO2 23 09/27/2016   GLUCOSE 99 09/27/2016   BUN 26 (H) 09/27/2016   CREATININE 1.63 (H) 09/27/2016   CALCIUM 8.6 (L) 09/27/2016   PROT 5.9 (L) 09/21/2016   ALBUMIN 4.1 09/21/2016   AST 28 09/21/2016   ALT 25 09/21/2016   ALKPHOS 147 (H) 09/21/2016   BILITOT <0.2 09/21/2016   GFRNONAA 32 (L) 09/27/2016   GFRAA 37 (L) 09/27/2016    Lab  Results  Component Value Date   WBC 4.8 10/26/2016   NEUTROABS 3.3 10/26/2016   HGB 10.1 (L) 10/26/2016   HCT 29.2 (L) 10/26/2016   MCV 91.1 10/26/2016   PLT 254 10/26/2016   Lab Results  Component Value Date   IRON 105 10/26/2016   TIBC 273 10/26/2016   IRONPCTSAT 38 (H) 10/26/2016   Lab Results  Component Value Date   FERRITIN 44 10/26/2016     STUDIES: No results found.  ASSESSMENT: Anemia in chronic kidney disease, unspecified stage.  PLAN:    1. Anemia in chronic kidney disease, unspecified stage: Patient's iron stores are within normal limits. She had a normal colonoscopy and EGD on Sep 07, 2016. CT of the abdomen and pelvis was essentially within normal limits. Patient has some mild chronic renal insufficiency with an inappropriately normal erythropoietin level that is likely contributing. Her reticulocyte count is inappropriately low for this level of anemia. Remainder of her blood work is either negative or within normal limits. She will return in 6 weeks for labs and to see if 40,000 units of subcutaneous Procrit is needed. Today hgb is 10.1. She does not  require a procrit injection.  2. Chronic renal insufficiency: She is seeing Dr. Candiss Norse.  3. Hypertension: BP today very elevated. 210/82. She is currently taking four BP medications. She has specific instructions on her medications and is not taking them appropriately. Called to make an appointment with PCP (Dr. Caryn Section) and husband stated that he/she would prefer to see her cardiologist. She will make an appointment with her cardiologist for tomorrow. Her cardiologist has already given her parameters for her BP med's should her BP be elevated. She has not adjusted or taken them as directed by her cardiologist. We spoke in length about how to take BP medications. She is going home to take the medications she has not been taking. She will re-check BP this afternoon. She is aware to go to ED if we can not get BP to a reasonable level.  Approximately 30 minutes was spent in discussion of which greater than 50% was consultation.  Patient expressed understanding and was in agreement with this plan. She also understands that She can call clinic at any time with any questions, concerns, or complaints.   Faythe Casa, NP  Patient was seen and evaluated independently and I agree with the assessment and plan as dictated above.  Lloyd Huger, MD 10/29/16 4:25 PM

## 2016-10-26 ENCOUNTER — Other Ambulatory Visit: Payer: Self-pay

## 2016-10-26 ENCOUNTER — Inpatient Hospital Stay: Payer: Medicare Other

## 2016-10-26 ENCOUNTER — Telehealth: Payer: Self-pay

## 2016-10-26 ENCOUNTER — Ambulatory Visit: Payer: Self-pay | Admitting: Physician Assistant

## 2016-10-26 ENCOUNTER — Inpatient Hospital Stay (HOSPITAL_BASED_OUTPATIENT_CLINIC_OR_DEPARTMENT_OTHER): Payer: Medicare Other | Admitting: Oncology

## 2016-10-26 VITALS — BP 210/82 | HR 67 | Temp 96.1°F | Resp 20 | Wt 107.9 lb

## 2016-10-26 DIAGNOSIS — Z79899 Other long term (current) drug therapy: Secondary | ICD-10-CM | POA: Diagnosis not present

## 2016-10-26 DIAGNOSIS — K219 Gastro-esophageal reflux disease without esophagitis: Secondary | ICD-10-CM | POA: Diagnosis not present

## 2016-10-26 DIAGNOSIS — D631 Anemia in chronic kidney disease: Secondary | ICD-10-CM | POA: Diagnosis not present

## 2016-10-26 DIAGNOSIS — D509 Iron deficiency anemia, unspecified: Secondary | ICD-10-CM

## 2016-10-26 DIAGNOSIS — I130 Hypertensive heart and chronic kidney disease with heart failure and stage 1 through stage 4 chronic kidney disease, or unspecified chronic kidney disease: Secondary | ICD-10-CM

## 2016-10-26 DIAGNOSIS — N189 Chronic kidney disease, unspecified: Principal | ICD-10-CM

## 2016-10-26 DIAGNOSIS — N183 Chronic kidney disease, stage 3 (moderate): Secondary | ICD-10-CM

## 2016-10-26 DIAGNOSIS — I509 Heart failure, unspecified: Secondary | ICD-10-CM | POA: Diagnosis not present

## 2016-10-26 LAB — CBC WITH DIFFERENTIAL/PLATELET
BASOS ABS: 0.1 10*3/uL (ref 0–0.1)
BASOS PCT: 1 %
EOS ABS: 0.3 10*3/uL (ref 0–0.7)
EOS PCT: 6 %
HCT: 29.2 % — ABNORMAL LOW (ref 35.0–47.0)
Hemoglobin: 10.1 g/dL — ABNORMAL LOW (ref 12.0–16.0)
LYMPHS PCT: 14 %
Lymphs Abs: 0.7 10*3/uL — ABNORMAL LOW (ref 1.0–3.6)
MCH: 31.4 pg (ref 26.0–34.0)
MCHC: 34.5 g/dL (ref 32.0–36.0)
MCV: 91.1 fL (ref 80.0–100.0)
Monocytes Absolute: 0.5 10*3/uL (ref 0.2–0.9)
Monocytes Relative: 10 %
Neutro Abs: 3.3 10*3/uL (ref 1.4–6.5)
Neutrophils Relative %: 69 %
PLATELETS: 254 10*3/uL (ref 150–440)
RBC: 3.21 MIL/uL — AB (ref 3.80–5.20)
RDW: 14.1 % (ref 11.5–14.5)
WBC: 4.8 10*3/uL (ref 3.6–11.0)

## 2016-10-26 LAB — IRON AND TIBC
IRON: 105 ug/dL (ref 28–170)
Saturation Ratios: 38 % — ABNORMAL HIGH (ref 10.4–31.8)
TIBC: 273 ug/dL (ref 250–450)
UIBC: 168 ug/dL

## 2016-10-26 LAB — FERRITIN: Ferritin: 44 ng/mL (ref 11–307)

## 2016-10-26 NOTE — Progress Notes (Signed)
Patient reports fatigue today, bp 210/82.

## 2016-10-26 NOTE — Telephone Encounter (Signed)
Kendra from Dr. Gary Fleet office called saying that patient's BP is extremely elevated. She is currently getting a treatment in their office now. Her BP is 210/82. No other symptoms. Per Dr. Grayland Ormond, patient needed to be evaluated by her PCP for her BP. Advised that Dr. Caryn Section is not in the office this week. Appt is scheduled to see Adriana today at 1:30pm.

## 2016-10-26 NOTE — Telephone Encounter (Signed)
Patient will be evaluated through cardiology instead. Appt cancelled.

## 2016-11-19 ENCOUNTER — Other Ambulatory Visit: Payer: Self-pay | Admitting: Family Medicine

## 2016-11-19 DIAGNOSIS — J019 Acute sinusitis, unspecified: Secondary | ICD-10-CM

## 2016-12-07 ENCOUNTER — Inpatient Hospital Stay: Payer: Medicare Other

## 2016-12-09 ENCOUNTER — Inpatient Hospital Stay: Payer: Medicare Other

## 2016-12-09 ENCOUNTER — Other Ambulatory Visit: Payer: Self-pay

## 2016-12-09 ENCOUNTER — Telehealth: Payer: Self-pay

## 2016-12-09 ENCOUNTER — Inpatient Hospital Stay: Payer: Medicare Other | Attending: Oncology

## 2016-12-09 VITALS — BP 126/78

## 2016-12-09 DIAGNOSIS — N189 Chronic kidney disease, unspecified: Principal | ICD-10-CM

## 2016-12-09 DIAGNOSIS — F419 Anxiety disorder, unspecified: Secondary | ICD-10-CM | POA: Diagnosis not present

## 2016-12-09 DIAGNOSIS — D509 Iron deficiency anemia, unspecified: Secondary | ICD-10-CM

## 2016-12-09 DIAGNOSIS — Z79899 Other long term (current) drug therapy: Secondary | ICD-10-CM | POA: Diagnosis not present

## 2016-12-09 DIAGNOSIS — Z8619 Personal history of other infectious and parasitic diseases: Secondary | ICD-10-CM | POA: Insufficient documentation

## 2016-12-09 DIAGNOSIS — K219 Gastro-esophageal reflux disease without esophagitis: Secondary | ICD-10-CM | POA: Insufficient documentation

## 2016-12-09 DIAGNOSIS — N183 Chronic kidney disease, stage 3 (moderate): Secondary | ICD-10-CM | POA: Diagnosis not present

## 2016-12-09 DIAGNOSIS — I129 Hypertensive chronic kidney disease with stage 1 through stage 4 chronic kidney disease, or unspecified chronic kidney disease: Secondary | ICD-10-CM | POA: Insufficient documentation

## 2016-12-09 DIAGNOSIS — Z86010 Personal history of colonic polyps: Secondary | ICD-10-CM | POA: Diagnosis not present

## 2016-12-09 DIAGNOSIS — Z7982 Long term (current) use of aspirin: Secondary | ICD-10-CM | POA: Diagnosis not present

## 2016-12-09 DIAGNOSIS — D631 Anemia in chronic kidney disease: Secondary | ICD-10-CM | POA: Insufficient documentation

## 2016-12-09 LAB — CBC WITH DIFFERENTIAL/PLATELET
Basophils Absolute: 0.1 10*3/uL (ref 0–0.1)
Basophils Relative: 1 %
EOS PCT: 4 %
Eosinophils Absolute: 0.2 10*3/uL (ref 0–0.7)
HCT: 24 % — ABNORMAL LOW (ref 35.0–47.0)
Hemoglobin: 8.2 g/dL — ABNORMAL LOW (ref 12.0–16.0)
LYMPHS PCT: 14 %
Lymphs Abs: 0.8 10*3/uL — ABNORMAL LOW (ref 1.0–3.6)
MCH: 30.2 pg (ref 26.0–34.0)
MCHC: 34.3 g/dL (ref 32.0–36.0)
MCV: 88.1 fL (ref 80.0–100.0)
Monocytes Absolute: 0.5 10*3/uL (ref 0.2–0.9)
Monocytes Relative: 9 %
NEUTROS ABS: 4 10*3/uL (ref 1.4–6.5)
NEUTROS PCT: 72 %
PLATELETS: 228 10*3/uL (ref 150–440)
RBC: 2.72 MIL/uL — ABNORMAL LOW (ref 3.80–5.20)
RDW: 15.7 % — ABNORMAL HIGH (ref 11.5–14.5)
WBC: 5.5 10*3/uL (ref 3.6–11.0)

## 2016-12-09 LAB — IRON AND TIBC
IRON: 100 ug/dL (ref 28–170)
Saturation Ratios: 39 % — ABNORMAL HIGH (ref 10.4–31.8)
TIBC: 255 ug/dL (ref 250–450)
UIBC: 155 ug/dL

## 2016-12-09 LAB — FERRITIN: Ferritin: 94 ng/mL (ref 11–307)

## 2016-12-09 MED ORDER — EPOETIN ALFA 40000 UNIT/ML IJ SOLN
40000.0000 [IU] | Freq: Once | INTRAMUSCULAR | Status: AC
  Administered 2016-12-09: 40000 [IU] via SUBCUTANEOUS
  Filled 2016-12-09: qty 1

## 2016-12-09 NOTE — Telephone Encounter (Signed)
Patient's hemoglobin today is 8.2 and received the procrit injection.  Last hemoglobin check on 10/26/16 was 10.1.  She is scheduled to return for lab/MD/Procrit in 6 weeks (01/18/17).  Would you like patient to return any sooner?  She would be more comfortable if she could come sooner for lab check.

## 2016-12-10 ENCOUNTER — Ambulatory Visit (INDEPENDENT_AMBULATORY_CARE_PROVIDER_SITE_OTHER): Payer: Medicare Other

## 2016-12-10 VITALS — BP 146/72 | HR 76 | Temp 99.4°F | Ht 64.0 in | Wt 107.8 lb

## 2016-12-10 DIAGNOSIS — Z0000 Encounter for general adult medical examination without abnormal findings: Secondary | ICD-10-CM

## 2016-12-10 NOTE — Patient Instructions (Addendum)
Amy [name] , Thank you for taking time to come for your Medicare Wellness Visit. I appreciate your ongoing commitment to your health goals. Please review the following plan we discussed and let me know if I can assist you in the future.   Screening recommendations/referrals: Colonoscopy: completed 09/07/16, due 09/2026 Mammogram: completed 08/05/15, due now Bone Density: completed 12/26/15 Recommended yearly ophthalmology/optometry visit for glaucoma screening and checkup Recommended yearly dental visit for hygiene and checkup  Vaccinations: Influenza vaccine: due fall 2018 Pneumococcal vaccine: Prevnar 13 given 12/04/14, Pneumovax due- pt declined today Tdap vaccine: completed 02/02/12, due 01/2022 Shingles vaccine: completed 01/20/11  Advanced directives: Please bring a copy of your POA (Power of [place]) and/or Living Will to your next appointment.   Conditions/risks identified: Continue drinking 6-8 glasses of water a day.  Next appointment: 12/13/16 @ 9:00 AM   Preventive Care 68 Years and Older, Female Preventive care refers to lifestyle choices and visits with your health care provider that can promote health and wellness. What does preventive care include?  A yearly physical exam. This is also called an annual well check.  Dental exams once or twice a year.  Routine eye exams. Ask your health care provider how often you should have your eyes checked.  Personal lifestyle choices, including:  Daily care of your teeth and gums.  Regular physical activity.  Eating a healthy diet.  Avoiding tobacco and drug use.  Limiting alcohol use.  Practicing safe sex.  Taking low-dose aspirin every day.  Taking vitamin and mineral supplements as recommended by your health care provider. What happens during an annual well check? The services and screenings done by your health care provider during your annual well check will depend on your age, overall health, lifestyle risk  factors, and family history of disease. Counseling  Your health care provider may ask you questions about your:  Alcohol use.  Tobacco use.  Drug use.  Emotional well-being.  Home and relationship well-being.  Sexual activity.  Eating habits.  History of falls.  Memory and ability to understand (cognition).  Work and work Statistician.  Reproductive health. Screening  You may have the following tests or measurements:  Height, weight, and BMI.  Blood pressure.  Lipid and cholesterol levels. These may be checked every 5 years, or more frequently if you are over 64 years old.  Skin check.  Lung cancer screening. You may have this screening every year starting at age 25 if you have a 30-pack-year history of smoking and currently smoke or have quit within the past 15 years.  Fecal occult blood test (FOBT) of the stool. You may have this test every year starting at age 30.  Flexible sigmoidoscopy or colonoscopy. You may have a sigmoidoscopy every 5 years or a colonoscopy every 10 years starting at age 35.  Hepatitis C blood test.  Hepatitis B blood test.  Sexually transmitted disease (STD) testing.  Diabetes screening. This is done by checking your blood sugar (glucose) after you have not eaten for a while (fasting). You may have this done every 1-3 years.  Bone density scan. This is done to screen for osteoporosis. You may have this done starting at age 20.  Mammogram. This may be done every 1-2 years. Talk to your health care provider about how often you should have regular mammograms. Talk with your health care provider about your test results, treatment options, and if necessary, the need for more tests. Vaccines  Your health care provider may recommend certain  vaccines, such as:  Influenza vaccine. This is recommended every year.  Tetanus, diphtheria, and acellular pertussis (Tdap, Td) vaccine. You may need a Td booster every 10 years.  Zoster vaccine. You  may need this after age 64.  Pneumococcal 13-valent conjugate (PCV13) vaccine. One dose is recommended after age 59.  Pneumococcal polysaccharide (PPSV23) vaccine. One dose is recommended after age 57. Talk to your health care provider about which screenings and vaccines you need and how often you need them. This information is not intended to replace advice given to you by your health care provider. Make sure you discuss any questions you have with your health care provider. Document Released: 05/16/2015 Document Revised: 01/07/2016 Document Reviewed: 02/18/2015 Elsevier Interactive Patient Education  2017 [place] Prevention in the Home Falls can cause injuries. They can happen to people of all ages. There are many things you can do to make your home safe and to help prevent falls. What can I do on the outside of my home?  Regularly fix the edges of walkways and driveways and fix any cracks.  Remove anything that might make you trip as you walk through a door, such as a raised step or threshold.  Trim any bushes or trees on the path to your home.  Use bright outdoor lighting.  Clear any walking paths of anything that might make someone trip, such as rocks or tools.  Regularly check to see if handrails are loose or broken. Make sure that both sides of any steps have handrails.  Any raised decks and porches should have guardrails on the edges.  Have any leaves, snow, or ice cleared regularly.  Use sand or salt on walking paths during winter.  Clean up any spills in your garage right away. This includes oil or grease spills. What can I do in the bathroom?  Use night lights.  Install grab bars by the toilet and in the tub and shower. Do not use towel bars as grab bars.  Use non-skid mats or decals in the tub or shower.  If you need to sit down in the shower, use a plastic, non-slip stool.  Keep the floor dry. Clean up any water that spills on the floor as soon as  it happens.  Remove soap buildup in the tub or shower regularly.  Attach bath mats securely with double-sided non-slip rug tape.  Do not have throw rugs and other things on the floor that can make you trip. What can I do in the bedroom?  Use night lights.  Make sure that you have a light by your bed that is easy to reach.  Do not use any sheets or blankets that are too big for your bed. They should not hang down onto the floor.  Have a firm chair that has side arms. You can use this for support while you get dressed.  Do not have throw rugs and other things on the floor that can make you trip. What can I do in the kitchen?  Clean up any spills right away.  Avoid walking on wet floors.  Keep items that you use a lot in easy-to-reach places.  If you need to reach something above you, use a strong step stool that has a grab bar.  Keep electrical cords out of the way.  Do not use floor polish or wax that makes floors slippery. If you must use wax, use non-skid floor wax.  Do not have throw rugs and other things  on the floor that can make you trip. What can I do with my stairs?  Do not leave any items on the stairs.  Make sure that there are handrails on both sides of the stairs and use them. Fix handrails that are broken or loose. Make sure that handrails are as long as the stairways.  Check any carpeting to make sure that it is firmly attached to the stairs. Fix any carpet that is loose or worn.  Avoid having throw rugs at the top or bottom of the stairs. If you do have throw rugs, attach them to the floor with carpet tape.  Make sure that you have a light switch at the top of the stairs and the bottom of the stairs. If you do not have them, ask someone to add them for you. What else can I do to help prevent falls?  Wear shoes that:  Do not have high heels.  Have rubber bottoms.  Are comfortable and fit you well.  Are closed at the toe. Do not wear sandals.  If  you use a stepladder:  Make sure that it is fully opened. Do not climb a closed stepladder.  Make sure that both sides of the stepladder are locked into place.  Ask someone to hold it for you, if possible.  Clearly mark and make sure that you can see:  Any grab bars or handrails.  First and last steps.  Where the edge of each step is.  Use tools that help you move around (mobility aids) if they are needed. These include:  Canes.  Walkers.  Scooters.  Crutches.  Turn on the lights when you go into a dark area. Replace any light bulbs as soon as they burn out.  Set up your furniture so you have a clear path. Avoid moving your furniture around.  If any of your floors are uneven, fix them.  If there are any pets around you, be aware of where they are.  Review your medicines with your doctor. Some medicines can make you feel dizzy. This can increase your chance of falling. Ask your doctor what other things that you can do to help prevent falls. This information is not intended to replace advice given to you by your health care provider. Make sure you discuss any questions you have with your health care provider. Document Released: 02/13/2009 Document Revised: 09/25/2015 Document Reviewed: 05/24/2014 Elsevier Interactive Patient Education  2017 [employer].

## 2016-12-10 NOTE — Progress Notes (Signed)
Subjective:   Amy [name] is a 66 y.o. female who presents for an Initial Medicare Annual Wellness Visit.  Review of Systems    N/A  Cardiac Risk Factors include: advanced age (>65men, >64 women);hypertension;sedentary lifestyle     Objective:    Today's Vitals   12/10/16 1448  BP: (!) 146/72  Pulse: 76  Temp: 99.4 F (37.4 C)  TempSrc: Oral  Weight: 107 lb 12.8 oz (48.9 kg)  Height: 5\' 4"  (1.626 m)  PainSc: 0-No pain   Body mass index is 18.5 kg/m.   Current Medications (verified) Outpatient Encounter Prescriptions as of 12/10/2016  Medication Sig  . buPROPion (WELLBUTRIN) 75 MG tablet TAKE 2 TABLETS BY MOUTH DAILY  . butalbital-acetaminophen-caffeine (FIORICET, ESGIC) 50-325-40 MG tablet TAKE 1-2 TABLETS BY MOUTH EVERY 4-6 HOURS AS NEEDED FOR PAIN, MAX 8 TABLETS DAILY  . carvedilol (COREG) 25 MG tablet TAKE 1 TABLET(25 MG) BY MOUTH TWICE DAILY WITH A MEAL  . cloNIDine (CATAPRES) 0.2 MG tablet Take 1 tablet (0.2 mg total) by mouth 3 (three) times daily.  . hydrALAZINE (APRESOLINE) 50 MG tablet Take 1 tablet (50 mg total) by mouth 3 (three) times daily. (Patient taking differently: Take 50 mg by mouth 3 (three) times daily. )  . irbesartan (AVAPRO) 300 MG tablet Take 1 tablet (300 mg total) by mouth daily.  . pantoprazole (PROTONIX) 40 MG tablet Take 1 tablet (40 mg total) by mouth 2 (two) times daily before a meal.  . ranitidine (ZANTAC) 150 MG tablet Take 150 mg by mouth 2 (two) times daily.  . valACYclovir (VALTREX) 1000 MG tablet 2 tablets twice a day for 1 day as needed for cold sores  . zolpidem (AMBIEN) 10 MG tablet TAKE 1 TABLET BY MOUTH EVERY DAY AT BEDTIME AS NEEDED FOR SLEEP  . aspirin EC 81 MG EC tablet Take 1 tablet (81 mg total) by mouth daily. (Patient not taking: Reported on 12/10/2016)  . [DISCONTINUED] amoxicillin-clavulanate (AUGMENTIN XR) 1000-62.5 MG 12 hr tablet TAKE 2 TABLETS BY MOUTH TWICE DAILY   No facility-administered encounter medications on  file as of 12/10/2016.     Allergies (verified) Cephalosporins; Clarithromycin; Lunesta  [eszopiclone]; Penicillin v; and Sulfa antibiotics   History: Past Medical History:  Diagnosis Date  . Anemia    a. 08/2016 Guaiac + stool. EGD w/ gastritis.  . Anxiety   . CKD (chronic kidney disease), stage III   . Colon polyp    a. 08/2016 Colonoscopy.  . Gastritis    a. 08/2016 EGD: Gastritis-->PPI.  [name] GERD (gastroesophageal reflux disease)   . History of chicken pox   . History of measles as a child   . Hypertension   . Hyponatremia    a. 08/2016 in setting of HCTZ Rx.  . Multiple thyroid nodules    Past Surgical History:  Procedure Laterality Date  . ABDOMINAL HYSTERECTOMY  2003   due to heavy periods and clotting during menses  . BREAST LUMPECTOMY Left 1990  . CHOLECYSTECTOMY  2010  . COLONOSCOPY WITH PROPOFOL N/A 09/07/2016   Procedure: COLONOSCOPY WITH PROPOFOL;  Surgeon: Lucilla Lame, MD;  Location: [facility] ENDOSCOPY;  Service: Endoscopy;  Laterality: N/A;  . DILATION AND CURETTAGE OF UTERUS  1985  . ESOPHAGOGASTRODUODENOSCOPY (EGD) WITH PROPOFOL N/A 09/07/2016   Procedure: ESOPHAGOGASTRODUODENOSCOPY (EGD) WITH PROPOFOL;  Surgeon: Lucilla Lame, MD;  Location: [facility] ENDOSCOPY;  Service: Endoscopy;  Laterality: N/A;  . GALLBLADDER SURGERY  2012  . KNEE SURGERY     Dr. Tamala Julian  .  MRI, Lumbar spine  07/23/2009   Multilevel annular bulge and disc protrusion at L2-L3, L3-L4, L4-L5, and L5-S1  . TONSILLECTOMY    . TONSILLECTOMY AND ADENOIDECTOMY  1970  . UPPER GI ENDOSCOPY  03/02/2006   H. Pylori negative; Dr. Allen Norris   Family History  Problem Relation Age of Onset  . Pancreatic cancer Father   . Congestive Heart Failure Maternal Grandmother   . Cancer Paternal Grandfather   . Heart disease Maternal Uncle    Social History   Occupational History  . Retired    Social History Main Topics  . Smoking status: Never Smoker  . Smokeless tobacco: Never Used  . Alcohol use 0.0 oz/week      Comment: few glasses a wine/wk.  . Drug use: No  . Sexual activity: Yes    Tobacco Counseling Counseling given: Not Answered   Activities of Daily Living In your present state of health, do you have any difficulty performing the following activities: 12/10/2016 09/24/2016  Hearing? N N  Vision? N N  Difficulty concentrating or making decisions? N N  Walking or climbing stairs? Y N  Dressing or bathing? N N  Doing errands, shopping? N N  Preparing Food and eating ? N -  Using the Toilet? N -  In the past six months, have you accidently leaked urine? N -  Do you have problems with loss of bowel control? N -  Managing your Medications? N -  Managing your Finances? N -  Housekeeping or managing your Housekeeping? N -  Some recent data might be hidden    Immunizations and Health Maintenance Immunization History  Administered Date(s) Administered  . Influenza, High Dose Seasonal PF 01/21/2015  . Pneumococcal Conjugate-13 12/04/2014  . Td 08/15/2000  . Tdap 02/02/2012  . Zoster 01/20/2011   Health Maintenance Due  Topic Date Due  . PNA vac Low Risk Adult (2 of 2 - PPSV23) 12/04/2015  . INFLUENZA VACCINE  12/01/2016    Patient Care Team: [name], MD as PCP - General (Family Medicine) Thornton Park, MD as Referring Physician (Orthopedic Surgery) Lloyd Huger, MD as Consulting Physician (Oncology)  Indicate any recent Medical Services you may have received from other than [facility] providers in the past year (date may be approximate).     Assessment:   This is a routine wellness examination for Amy [name].   Hearing/Vision screen Vision Screening Comments: Pt has vision checks every year but changes doctors.   Dietary issues and exercise activities discussed: Current Exercise Habits: The patient does not participate in regular exercise at present (active lifestyle), Exercise limited by: orthopedic condition(s) (knee pain)  Goals    . Exercise 3x per week (30  min per time)          Continue drinking 6-8 glasses of water a day.      Depression Screen PHQ 2/9 Scores 12/10/2016 12/11/2015 12/04/2014  PHQ - 2 Score 0 0 0  PHQ- 9 Score - 0 0    Fall Risk Fall Risk  12/10/2016 12/11/2015  Falls in the past year? No No    Cognitive Function:      6CIT Screen 12/10/2016  What Year? 0 points  What month? 0 points  What time? 0 points  Count back from 20 0 points  Months in reverse 0 points  Repeat phrase 0 points  Total Score 0    Screening Tests Health Maintenance  Topic Date Due  . PNA vac Low Risk Adult (2  of 2 - PPSV23) 12/04/2015  . INFLUENZA VACCINE  12/01/2016  . MAMMOGRAM  08/04/2017  . DEXA SCAN  12/25/2017  . COLONOSCOPY  09/08/2019  . TETANUS/TDAP  02/01/2022  . Hepatitis C Screening  Completed      Plan:  I have personally reviewed and addressed the Medicare Annual Wellness questionnaire and have noted the following in the patient's chart:  A. Medical and social history B. Use of alcohol, tobacco or illicit drugs  C. Current medications and supplements D. Functional ability and status E.  Nutritional status F.  Physical activity G. Advance directives H. List of other physicians I.  Hospitalizations, surgeries, and ER visits in previous 12 months J.  [place] such as hearing and vision if needed, cognitive and depression L. Referrals and appointments - none  In addition, I have reviewed and discussed with patient certain preventive protocols, quality metrics, and best practice recommendations. A written personalized care plan for preventive services as well as general preventive health recommendations were provided to patient.  See attached scanned questionnaire for additional information.   Signed,  Fabio Neighbors, LPN Nurse Health Advisor   MD Recommendations: Pt needs Pneumovax 23 at next OV on 12/13/16. Pt declined vaccine today.

## 2016-12-12 NOTE — Progress Notes (Signed)
Cardiology Office Note  Date:  12/14/2016   ID:  Amy [name], DOB 21-Jul-1950, MRN [id number]  PCP:  [name], MD   Chief Complaint  Patient presents with  . other    2 month f/u c/o left knee pain/thigh swelling, low blood count and elevated BP. Meds reviewed verbally with pt.    HPI:  Amy [name] is a 66 year old woman with history of  poorly controlled hypertension,  chronic renal insufficiency,  anemia chronic headaches managed with pain medication,  EGD confirming gastritis,  hospital admission for hyponatremia for which she received IV fluids and diuretic was held,   hospital admission for chest pain Felt secondary to gastritis Who presents for follow-up of her chest pain symptoms, And essential hypertension  She is followed by Dr. Grayland Ormond for chronic anemia secondary to underlying renal dysfunction Recent procrit shot for hemoglobin of 8 Iron levels ok CR 1.6, BUN 26, Dr. Candiss Norse (10/2016)  Sodium 133, previously was 120s back in May 2018  BP yesterday 130/58 with Dr. Candiss Norse Hydralazine was held, irbesartan down to 150 Blood pressure elevated on today's visit Denies having any significant chest discomfort, leg swelling improved some fatigue at baseline which she attributes to her anemia  EKG personally reviewed by myself on todays visit shows normal sinus rhythm  with rate 68 bpm with no significant ST or T-wave changes  Other past medical history reviewed Previous chest pain with atypical in nature, possible anxiety  For her gastritis she is taking Protonix once a day with ranitidine twice a day   PMH:   has a past medical history of Anemia; Anxiety; CKD (chronic kidney disease), stage III; Colon polyp; Gastritis; GERD (gastroesophageal reflux disease); History of chicken pox; History of measles as a child; Hypertension; Hyponatremia; and Multiple thyroid nodules.  PSH:    Past Surgical History:  Procedure Laterality Date  . ABDOMINAL HYSTERECTOMY  2003   due to heavy periods and clotting during menses  . BREAST LUMPECTOMY Left 1990  . CHOLECYSTECTOMY  2010  . COLONOSCOPY WITH PROPOFOL N/A 09/07/2016   Procedure: COLONOSCOPY WITH PROPOFOL;  Surgeon: Lucilla Lame, MD;  Location: [facility] ENDOSCOPY;  Service: Endoscopy;  Laterality: N/A;  . DILATION AND CURETTAGE OF UTERUS  1985  . ESOPHAGOGASTRODUODENOSCOPY (EGD) WITH PROPOFOL N/A 09/07/2016   Procedure: ESOPHAGOGASTRODUODENOSCOPY (EGD) WITH PROPOFOL;  Surgeon: Lucilla Lame, MD;  Location: [facility] ENDOSCOPY;  Service: Endoscopy;  Laterality: N/A;  . GALLBLADDER SURGERY  2012  . KNEE SURGERY     Dr. Tamala Julian  . MRI, Lumbar spine  07/23/2009   Multilevel annular bulge and disc protrusion at L2-L3, L3-L4, L4-L5, and L5-S1  . TONSILLECTOMY    . TONSILLECTOMY AND ADENOIDECTOMY  1970  . UPPER GI ENDOSCOPY  03/02/2006   H. Pylori negative; Dr. Allen Norris    Current Outpatient Prescriptions  Medication Sig Dispense Refill  . alendronate (FOSAMAX) 70 MG tablet Take 1 tablet (70 mg total) by mouth every 7 (seven) days. Take with a full glass of water on an empty stomach. 12 tablet 4  . aspirin EC 81 MG EC tablet Take 1 tablet (81 mg total) by mouth daily.    [name] buPROPion (WELLBUTRIN) 75 MG tablet TAKE 2 TABLETS BY MOUTH DAILY 180 tablet 2  . butalbital-acetaminophen-caffeine (FIORICET, ESGIC) 50-325-40 MG tablet TAKE 1-2 TABLETS BY MOUTH EVERY 4-6 HOURS AS NEEDED FOR PAIN, MAX 8 TABLETS DAILY 150 tablet 5  . carvedilol (COREG) 25 MG tablet TAKE 1 TABLET(25 MG) BY MOUTH TWICE DAILY WITH  A MEAL 60 tablet 2  . cloNIDine (CATAPRES) 0.2 MG tablet Take 1 tablet (0.2 mg total) by mouth 3 (three) times daily. 60 tablet 11  . irbesartan (AVAPRO) 150 MG tablet Take 1 tablet (150 mg total) by mouth daily. 90 tablet 3  . pantoprazole (PROTONIX) 40 MG tablet Take 1 tablet (40 mg total) by mouth 2 (two) times daily before a meal. 60 tablet 5  . ranitidine (ZANTAC) 150 MG tablet Take 150 mg by mouth 2 (two) times daily.    .  valACYclovir (VALTREX) 1000 MG tablet 2 tablets twice a day for 1 day as needed for cold sores 20 tablet 1  . zolpidem (AMBIEN) 10 MG tablet TAKE 1 TABLET BY MOUTH EVERY DAY AT BEDTIME AS NEEDED FOR SLEEP 30 tablet 5   No current facility-administered medications for this visit.      Allergies:   Cephalosporins; Clarithromycin; Lunesta  [eszopiclone]; Penicillin v; and Sulfa antibiotics   Social History:  The patient  reports that she has never smoked. She has never used smokeless tobacco. She reports that she drinks alcohol. She reports that she does not use drugs.   Family History:   family history includes Cancer in her paternal grandfather; Congestive Heart Failure in her maternal grandmother; Heart disease in her maternal uncle; Pancreatic cancer in her father.    Review of Systems: Review of Systems  Constitutional: Positive for malaise/fatigue.  Respiratory: Negative.   Cardiovascular: Negative.   Gastrointestinal: Negative.   Musculoskeletal: Negative.   Neurological: Negative.   Psychiatric/Behavioral: Negative.   All other systems reviewed and are negative.    PHYSICAL EXAM: VS:  BP (!) 150/70 (BP Location: Left Arm, Patient Position: Sitting, Cuff Size: Normal)   Pulse 68   Ht 5\' 4"  (1.626 m)   Wt 107 lb 4 oz (48.6 kg)   BMI 18.41 kg/m  , BMI Body mass index is 18.41 kg/m. GEN: Thin, in no acute distress  HEENT: normal  Neck: no JVD, carotid bruits, or masses Cardiac: RRR; no murmurs, rubs, or gallops, 1+ pitting  edema  to the mid shins Respiratory:  clear to auscultation bilaterally, normal work of breathing GI: soft, nontender, nondistended, + BS MS: no deformity or atrophy  Skin: warm and dry, no rash Neuro:  Strength and sensation are intact Psych: euthymic mood, full affect    Recent Labs: 09/21/2016: ALT 25 09/23/2016: B Natriuretic Peptide 370.0 09/24/2016: Magnesium 1.9 09/27/2016: BUN 26; Creatinine, Ser 1.63; Potassium 3.6; Sodium 133 10/05/2016:  TSH 4.220 12/09/2016: Hemoglobin 8.2; Platelets 228    Lipid Panel Lab Results  Component Value Date   CHOL 165 09/24/2016   HDL 62 09/24/2016   LDLCALC 78 09/24/2016   TRIG 126 09/24/2016      Wt Readings from Last 3 Encounters:  12/14/16 107 lb 4 oz (48.6 kg)  12/13/16 108 lb (49 kg)  12/10/16 107 lb 12.8 oz (48.9 kg)       ASSESSMENT AND PLAN:  Chest tightness or pressure - Plan: EKG 12-Lead  Previous hospitalization, ruled out for MI No further episodes  Atypical in nature Previously discussed CT coronary calcium scoring for risk stratification. This could be ordered for additional episodes of chest pain   Chronic kidney disease (CKD), active medical management without dialysis, stage 3 (moderate) - Followed by  Dr. Candiss Norse  Etiology  possibly secondary to chronic hypertension She had Epogen recently   Essential hypertension - Plan: EKG 12-Lead Blood pressure elevated on today's visit, recommended she  monitor blood pressure at home and consider increasing Avapro back to 300 mg daily for systolic pressures more than 150 on a regular basis. She will take hydralazine as needed  Chronic gastritis without bleeding, unspecified gastritis type - Plan: EKG 12-Lead Currently on proton pump inhibitor and H2 blocker  Anemia in stage 3 chronic kidney disease She received Epogen  Leg swelling Minimal swelling, likely secondary to anemia   Total encounter time more than 25 minutes  Greater than 50% was spent in counseling and coordination of care with the patient   Disposition:   F/U  2 months   Orders Placed This Encounter  Procedures  . EKG 12-Lead     Signed, Esmond Plants, M.D., Ph.D. 12/14/2016  [place], [place]

## 2016-12-13 ENCOUNTER — Encounter: Payer: Self-pay | Admitting: Family Medicine

## 2016-12-13 ENCOUNTER — Telehealth: Payer: Self-pay | Admitting: Oncology

## 2016-12-13 ENCOUNTER — Ambulatory Visit (INDEPENDENT_AMBULATORY_CARE_PROVIDER_SITE_OTHER): Payer: Medicare Other | Admitting: Family Medicine

## 2016-12-13 VITALS — BP 110/58 | HR 67 | Temp 97.6°F | Resp 16 | Wt 108.0 lb

## 2016-12-13 DIAGNOSIS — Z23 Encounter for immunization: Secondary | ICD-10-CM | POA: Diagnosis not present

## 2016-12-13 DIAGNOSIS — E871 Hypo-osmolality and hyponatremia: Secondary | ICD-10-CM | POA: Diagnosis not present

## 2016-12-13 DIAGNOSIS — D631 Anemia in chronic kidney disease: Secondary | ICD-10-CM

## 2016-12-13 DIAGNOSIS — M810 Age-related osteoporosis without current pathological fracture: Secondary | ICD-10-CM | POA: Diagnosis not present

## 2016-12-13 DIAGNOSIS — R600 Localized edema: Secondary | ICD-10-CM | POA: Diagnosis not present

## 2016-12-13 DIAGNOSIS — N183 Chronic kidney disease, stage 3 unspecified: Secondary | ICD-10-CM

## 2016-12-13 DIAGNOSIS — N189 Chronic kidney disease, unspecified: Secondary | ICD-10-CM

## 2016-12-13 DIAGNOSIS — I10 Essential (primary) hypertension: Secondary | ICD-10-CM | POA: Diagnosis not present

## 2016-12-13 MED ORDER — IRBESARTAN 150 MG PO TABS: 150.0000 mg | ORAL_TABLET | Freq: Every day | ORAL | 3 refills | Status: DC

## 2016-12-13 MED ORDER — ALENDRONATE SODIUM 70 MG PO TABS: 70.0000 mg | ORAL_TABLET | ORAL | 4 refills | Status: DC

## 2016-12-13 NOTE — Telephone Encounter (Signed)
Any update?

## 2016-12-13 NOTE — Telephone Encounter (Signed)
Per Dr. Grayland Ormond patient to be added on for visit in 2 weeks for lab/FINN/procrit, schedule message sent.

## 2016-12-13 NOTE — Patient Instructions (Signed)
   The CDC recommends two doses of Shingrix separated by 2 to 6 months for adults age 68 years and older. I recommend checking with your pharmacy plan regarding coverage for this vaccine.

## 2016-12-13 NOTE — Progress Notes (Signed)
Patient: Amy [name] Female    DOB: 28-May-1950   66 y.o.   MRN: [id number] Visit Date: 12/13/2016  Today's Provider: Lelon Huh, MD   Chief Complaint  Patient presents with  . Hypertension    follow up   . Gastroesophageal Reflux    follow up   . Osteoporosis    follow up   . Chronic Kidney Disease    follow up   . Anemia    follow up    Subjective:    HPI  Hypertension, follow-up:  BP Readings from Last 3 Encounters:  12/10/16 (!) 146/72  12/09/16 126/78  10/26/16 (!) 210/82    She was last seen for hypertension 2 months ago.  BP at that visit was 168/70. Management since that visit includes no changes. She reports fair compliance with treatment. Has since had follow up with Dr. Rockey Situ for blood pressure control  Was started on hydralazine which she is now taking 1/2 x 50mg  TID.   She had been on irbesartan 300mg  a day, but ran out a week ago. BP has been staying low and she felt a little dizzy off and on.  She is not having side effects.  She is not exercising. She is adherent to low salt diet.   Outside blood pressures are not being checked. She is experiencing none.  Patient denies chest pain, chest pressure/discomfort, claudication, dyspnea, exertional chest pressure/discomfort, fatigue, irregular heart beat, lower extremity edema, near-syncope, orthopnea, palpitations, paroxysmal nocturnal dyspnea, syncope and tachypnea.   Cardiovascular risk factors include advanced age (older than 37 for men, 53 for women) and hypertension.  Use of agents associated with hypertension: NSAIDS.     Weight trend: stable Wt Readings from Last 3 Encounters:  12/10/16 107 lb 12.8 oz (48.9 kg)  10/26/16 107 lb 14.4 oz (48.9 kg)  10/13/16 115 lb (52.2 kg)    Current diet: in general, a "healthy" diet    ------------------------------------------------------------------------ Follow up of GERD:  Patient was last seen for this problem 1 year ago and no changes  were made. Patient reports good compliance with treatment, good tolerance and good symptom control.   Follow up of Osteoporosis:  Patient was last seen for this problem 1 year ago and was started on Fosamax. Patient comes in today stating that she stopped taking Fosamax several months ago due to her concerns of possible side effects.   Follow up of CKD:  Patient was last seen for this problem 2 months ago and no changes were made. Patient was advised to continue regular follow up with Nephrology. She has an appointment with Dr. Candiss Norse later this morning.   Follow up of Anemia:  Patient was last seen for this problem 2 months ago and was referred to Hematology. She has since received two dose of Procrit, the last was on on 12-09-16. She does not yet have follow up scheduled with Dr. Grayland Ormond.   Follow up of Headaches:  Patient was last seen for this problem 10 months ago and no changes were made. Patient reports good compliance with treatment, good tolerance and fair symptom control.     Allergies  Allergen Reactions  . Cephalosporins   . Clarithromycin   . Lunesta  [Eszopiclone]     Heart racing  . Penicillin V     Has patient had a PCN reaction causing immediate rash, facial/tongue/throat swelling, SOB or lightheadedness with hypotension: Yes Has patient had a PCN reaction causing severe rash involving  mucus membranes or skin necrosis: No Has patient had a PCN reaction that required hospitalization No Has patient had a PCN reaction occurring within the last 10 years: No If all of the above answers are "NO", then may proceed with Cephalosporin use.  . Sulfa Antibiotics      Current Outpatient Prescriptions:  .  aspirin EC 81 MG EC tablet, Take 1 tablet (81 mg total) by mouth daily., Disp: , Rfl:  .  buPROPion (WELLBUTRIN) 75 MG tablet, TAKE 2 TABLETS BY MOUTH DAILY, Disp: 180 tablet, Rfl: 2 .  butalbital-acetaminophen-caffeine (FIORICET, ESGIC) 50-325-40 MG tablet, TAKE 1-2 TABLETS BY  MOUTH EVERY 4-6 HOURS AS NEEDED FOR PAIN, MAX 8 TABLETS DAILY, Disp: 150 tablet, Rfl: 5 .  carvedilol (COREG) 25 MG tablet, TAKE 1 TABLET(25 MG) BY MOUTH TWICE DAILY WITH A MEAL, Disp: 60 tablet, Rfl: 2 .  cloNIDine (CATAPRES) 0.2 MG tablet, Take 1 tablet (0.2 mg total) by mouth 3 (three) times daily., Disp: 60 tablet, Rfl: 11 .  hydrALAZINE (APRESOLINE) 50 MG tablet, Take 1 tablet (50 mg total) by mouth 3 (three) times daily. (Patient taking differently: Take 50 mg by mouth 3 (three) times daily. ), Disp: 90 tablet, Rfl: 6 .  pantoprazole (PROTONIX) 40 MG tablet, Take 1 tablet (40 mg total) by mouth 2 (two) times daily before a meal., Disp: 60 tablet, Rfl: 5 .  ranitidine (ZANTAC) 150 MG tablet, Take 150 mg by mouth 2 (two) times daily., Disp: , Rfl:  .  valACYclovir (VALTREX) 1000 MG tablet, 2 tablets twice a day for 1 day as needed for cold sores, Disp: 20 tablet, Rfl: 1 .  zolpidem (AMBIEN) 10 MG tablet, TAKE 1 TABLET BY MOUTH EVERY DAY AT BEDTIME AS NEEDED FOR SLEEP, Disp: 30 tablet, Rfl: 5 .  irbesartan (AVAPRO) 300 MG tablet, Take 1 tablet (300 mg total) by mouth daily. (Patient not taking: Reported on 12/13/2016), Disp: 30 tablet, Rfl: 2  Review of Systems  Constitutional: Positive for fatigue. Negative for chills and fever.  HENT: Negative for congestion, ear pain, rhinorrhea, sneezing and sore throat.   Eyes: Negative.  Negative for pain and redness.  Respiratory: Negative for cough, shortness of breath and wheezing.   Cardiovascular: Negative for chest pain and leg swelling.  Gastrointestinal: Negative for abdominal pain, blood in stool, constipation, diarrhea and nausea.  Endocrine: Negative for polydipsia and polyphagia.  Genitourinary: Negative.  Negative for dysuria, flank pain, hematuria, pelvic pain, vaginal bleeding and vaginal discharge.  Musculoskeletal: Positive for arthralgias. Negative for back pain, gait problem and joint swelling.  Skin: Negative for rash.  Neurological:  Positive for headaches. Negative for dizziness, tremors, seizures, weakness, light-headedness and numbness.  Hematological: Negative for adenopathy.  Psychiatric/Behavioral: Negative.  Negative for behavioral problems, confusion and dysphoric mood. The patient is not nervous/anxious and is not hyperactive.     Social History  Substance Use Topics  . Smoking status: Never Smoker  . Smokeless tobacco: Never Used  . Alcohol use 0.0 oz/week     Comment: few glasses a wine/wk.   Objective:   BP (!) 110/58 (BP Location: Right Arm, Patient Position: Sitting, Cuff Size: Normal)   Pulse 67   Temp 97.6 F (36.4 C) (Oral)   Resp 16   Wt 108 lb (49 kg)   SpO2 97% Comment: room air  BMI 18.54 kg/m  There were no vitals filed for this visit.   Physical Exam    General Appearance:    Alert, cooperative, no distress,  appears stated age  Head:    Normocephalic, without obvious abnormality, atraumatic  Eyes:    PERRL, conjunctiva/corneas clear, EOM's intact, fundi    benign, both eyes  Ears:    Normal TM's and external ear canals, both ears  Nose:   Nares normal, septum midline, mucosa normal, no drainage    or sinus tenderness  Throat:   Lips, mucosa, and tongue normal; teeth and gums normal  Neck:   Supple, symmetrical, trachea midline, no adenopathy;    thyroid:  no enlargement/tenderness/nodules; no carotid   bruit or JVD  Lungs:     Clear to auscultation bilaterally, respirations unlabored  Chest Wall:    No tenderness or deformity   Heart:    Regular rate and rhythm, S1 and S2 normal, no murmur, rub   or gallop  Breast Exam:    normal appearance, no masses or tenderness  Abdomen:     Soft, non-tender, bowel sounds active all four quadrants,    no masses, no organomegaly  Pelvic:    deferred  Extremities:   Extremities normal, atraumatic, no cyanosis or edema        Assessment & Plan:     1. Essential hypertension BP running a little now, has been out of 300mg  irbesartan  for at least a week. Discussed renal protective effects of ARBs. She requested a refill of irbesartan. I went ahead and printed prescription lower dose of irbesartan, but she is to talk to Dr. Candiss Norse about it today to see if is in agreement. Consider reducing dose of hydralazine if she has problems with hypotension.  - irbesartan (AVAPRO) 150 MG tablet; Take 1 tablet (150 mg total) by mouth daily.  Dispense: 90 tablet; Refill: 3  2. Osteoporosis without current pathological fracture, unspecified osteoporosis type She took prescription of Fosamax last year with no adverse effects, but did not get it refilled. Counseled on benefits of treating osteoporosis and new prescription sent to her pharmacy.   3. Chronic kidney disease (CKD), active medical management without dialysis, stage 3 (moderate) Restart ARB if Dr. Candiss Norse is in agreement.  - irbesartan (AVAPRO) 150 MG tablet; Take 1 tablet (150 mg total) by mouth daily.  Dispense: 90 tablet; Refill: 3  4. Anemia in chronic kidney disease, unspecified CKD stage Ferritin levels were on low side of normal. She is going to start taking one iron supplement in addition to Procrit given by  Hematology.   5. Need for pneumococcal vaccination  - Pneumococcal polysaccharide vaccine 23-valent greater than or equal to 2yo subcutaneous/IM       Lelon Huh, MD  [facility]

## 2016-12-13 NOTE — Telephone Encounter (Signed)
Lab/MD/PROCRIT in 2 weeks, per 12/13/16 schd msg/Kendra York. MF L/M on V/M/ Appt also mailed.

## 2016-12-14 ENCOUNTER — Ambulatory Visit (INDEPENDENT_AMBULATORY_CARE_PROVIDER_SITE_OTHER): Payer: Medicare Other | Admitting: Cardiovascular Disease

## 2016-12-14 ENCOUNTER — Encounter: Payer: Self-pay | Admitting: Cardiovascular Disease

## 2016-12-14 VITALS — BP 150/70 | HR 68 | Ht 64.0 in | Wt 107.2 lb

## 2016-12-14 DIAGNOSIS — I10 Essential (primary) hypertension: Secondary | ICD-10-CM

## 2016-12-14 DIAGNOSIS — N183 Chronic kidney disease, stage 3 unspecified: Secondary | ICD-10-CM

## 2016-12-14 DIAGNOSIS — R0789 Other chest pain: Secondary | ICD-10-CM

## 2016-12-14 DIAGNOSIS — R600 Localized edema: Secondary | ICD-10-CM | POA: Diagnosis not present

## 2016-12-14 NOTE — Patient Instructions (Signed)

## 2016-12-21 ENCOUNTER — Other Ambulatory Visit: Payer: Self-pay | Admitting: Family Medicine

## 2016-12-21 DIAGNOSIS — J019 Acute sinusitis, unspecified: Secondary | ICD-10-CM

## 2016-12-21 DIAGNOSIS — M1712 Unilateral primary osteoarthritis, left knee: Secondary | ICD-10-CM | POA: Diagnosis not present

## 2016-12-24 ENCOUNTER — Other Ambulatory Visit: Payer: Self-pay

## 2016-12-24 DIAGNOSIS — N189 Chronic kidney disease, unspecified: Principal | ICD-10-CM

## 2016-12-24 DIAGNOSIS — D631 Anemia in chronic kidney disease: Secondary | ICD-10-CM

## 2016-12-26 NOTE — Progress Notes (Signed)
[facility]  Telephone:([phone]) [phone] Fax:([phone]) [phone]  ID: Amy [name] OB: [birth year]  MR#: [id number]  LOV#:[id number]  Patient Care Team: [name], MD as PCP - General (Family Medicine) Thornton Park, MD as Referring Physician (Orthopedic Surgery) Lloyd Huger, MD as Consulting Physician (Oncology) Minna Merritts, MD as Consulting Physician (Cardiology) Murlean Iba, MD as Referring Physician (Nephrology)  CHIEF COMPLAINT:  Anemia in chronic kidney disease, unspecified stage.  INTERVAL HISTORY: Patient returns to clinic for further evaluation, laboratory work, and consideration of Procrit. She was initially referred to clinic after a hospital admission where she was found to have significant anemia. Her iron stores were within normal limits, although she required several units packed red blood cells. Colonoscopy and EGD did not reveal a distinct etiology. Patient is noted to have some mild renal insufficiency which may be contributing. She currently complains of mild fatigue but otherwise feels well. She has continued to struggle with hypertension and her blood pressure fluctuates. She reports that her pcp recently decreased her blood pressure medications and feels this may be contributory. She has no neurologic complaints. She denies any fevers. She has a good appetite and denies weight loss. She has no chest pain or shortness of breath. She denies any nausea, vomiting, constipation, or diarrhea. She has no urinary complaints. Patient otherwise feels well and offers no further specific complaints.  REVIEW OF SYSTEMS:   Review of Systems  Constitutional: Positive for malaise/fatigue. Negative for fever and weight loss.  Respiratory: Negative.  Negative for cough and shortness of breath.   Cardiovascular: Negative.  Negative for chest pain and leg swelling.  Gastrointestinal: Negative.  Negative for abdominal pain, blood in stool and melena.    Genitourinary: Negative.   Musculoskeletal: Negative.   Skin: Negative.  Negative for rash.  Neurological: Positive for weakness.  Psychiatric/Behavioral: The patient is nervous/anxious.     As per HPI. Otherwise, a complete review of systems is negative.  PAST MEDICAL HISTORY: Past Medical History:  Diagnosis Date  . Anemia    a. 08/2016 Guaiac + stool. EGD w/ gastritis.  . Anxiety   . CKD (chronic kidney disease), stage III   . Colon polyp    a. 08/2016 Colonoscopy.  . Gastritis    a. 08/2016 EGD: Gastritis-->PPI.  [name] GERD (gastroesophageal reflux disease)   . History of chicken pox   . History of measles as a child   . Hypertension   . Hyponatremia    a. 08/2016 in setting of HCTZ Rx.  . Multiple thyroid nodules     PAST SURGICAL HISTORY: Past Surgical History:  Procedure Laterality Date  . ABDOMINAL HYSTERECTOMY  2003   due to heavy periods and clotting during menses  . BREAST LUMPECTOMY Left 1990  . CHOLECYSTECTOMY  2010  . COLONOSCOPY WITH PROPOFOL N/A 09/07/2016   Procedure: COLONOSCOPY WITH PROPOFOL;  Surgeon: Lucilla Lame, MD;  Location: [facility] ENDOSCOPY;  Service: Endoscopy;  Laterality: N/A;  . DILATION AND CURETTAGE OF UTERUS  1985  . ESOPHAGOGASTRODUODENOSCOPY (EGD) WITH PROPOFOL N/A 09/07/2016   Procedure: ESOPHAGOGASTRODUODENOSCOPY (EGD) WITH PROPOFOL;  Surgeon: Lucilla Lame, MD;  Location: [facility] ENDOSCOPY;  Service: Endoscopy;  Laterality: N/A;  . GALLBLADDER SURGERY  2012  . KNEE SURGERY     Dr. Tamala Julian  . MRI, Lumbar spine  07/23/2009   Multilevel annular bulge and disc protrusion at L2-L3, L3-L4, L4-L5, and L5-S1  . TONSILLECTOMY    . TONSILLECTOMY AND ADENOIDECTOMY  1970  . UPPER GI  ENDOSCOPY  03/02/2006   H. Pylori negative; Dr.  Norris    FAMILY HISTORY: Family History  Problem Relation Age of Onset  . Pancreatic cancer Father   . Congestive Heart Failure Maternal Grandmother   . Cancer Paternal Grandfather   . Heart disease Maternal Uncle      ADVANCED DIRECTIVES (Y/N):  N  HEALTH MAINTENANCE: Social History  Substance Use Topics  . Smoking status: Never Smoker  . Smokeless tobacco: Never Used  . Alcohol use 0.0 oz/week     Comment: few glasses a wine/wk.     Colonoscopy:  PAP:  Bone density:  Lipid panel:  Allergies  Allergen Reactions  . Cephalosporins   . Clarithromycin   . Lunesta  [Eszopiclone]     Heart racing  . Penicillin V     Has patient had a PCN reaction causing immediate rash, facial/tongue/throat swelling, SOB or lightheadedness with hypotension: Yes Has patient had a PCN reaction causing severe rash involving mucus membranes or skin necrosis: No Has patient had a PCN reaction that required hospitalization No Has patient had a PCN reaction occurring within the last 10 years: No If all of the above answers are "NO", then may proceed with Cephalosporin use.  . Sulfa Antibiotics     Current Outpatient Prescriptions  Medication Sig Dispense Refill  . alendronate (FOSAMAX) 70 MG tablet Take 1 tablet (70 mg total) by mouth every 7 (seven) days. Take with a full glass of water on an empty stomach. 12 tablet 4  . amLODipine (NORVASC) 2.5 MG tablet amlodipine 2.5 mg tablet    . aspirin EC 81 MG EC tablet Take 1 tablet (81 mg total) by mouth daily.    [name] buPROPion (WELLBUTRIN) 75 MG tablet TAKE 2 TABLETS BY MOUTH DAILY 180 tablet 2  . butalbital-acetaminophen-caffeine (FIORICET, ESGIC) 50-325-40 MG tablet TAKE 1-2 TABLETS BY MOUTH EVERY 4-6 HOURS AS NEEDED FOR PAIN, MAX 8 TABLETS DAILY 150 tablet 5  . carvedilol (COREG) 25 MG tablet TAKE 1 TABLET(25 MG) BY MOUTH TWICE DAILY WITH A MEAL 60 tablet 2  . cloNIDine (CATAPRES) 0.2 MG tablet Take 1 tablet (0.2 mg total) by mouth 3 (three) times daily. 60 tablet 11  . furosemide (LASIX) 20 MG tablet furosemide 20 mg tablet  TK 1 T PO QD PRF SWELLING    . hydrALAZINE (APRESOLINE) 50 MG tablet hydralazine 50 mg tablet    . irbesartan (AVAPRO) 150 MG tablet Take 1  tablet (150 mg total) by mouth daily. 90 tablet 3  . LORazepam (ATIVAN) 1 MG tablet lorazepam 1 mg tablet  TAKE 1 TABLET BY MOUTH TWICE DAILY AS NEEDED    . meloxicam (MOBIC) 15 MG tablet Mobic 15 mg tablet  Take 1 tablet every day by oral route.    . pantoprazole (PROTONIX) 40 MG tablet Take 1 tablet (40 mg total) by mouth 2 (two) times daily before a meal. 60 tablet 5  . ranitidine (ZANTAC) 150 MG tablet Take 150 mg by mouth 2 (two) times daily.    . valACYclovir (VALTREX) 1000 MG tablet 2 tablets twice a day for 1 day as needed for cold sores 20 tablet 1  . zolpidem (AMBIEN) 10 MG tablet TAKE 1 TABLET BY MOUTH EVERY DAY AT BEDTIME AS NEEDED FOR SLEEP 30 tablet 5   No current facility-administered medications for this visit.     OBJECTIVE: Vitals:   12/27/16 1149  BP: (!) 190/75  Pulse: 60     Body mass index is 18.97  kg/m.    ECOG FS:0 - Asymptomatic  General: Well-developed, well-nourished, no acute distress. Eyes: Pink conjunctiva, anicteric sclera. Lungs: Clear to auscultation bilaterally. Heart: Regular rate and rhythm. No rubs, murmurs, or gallops. Abdomen: Soft, nontender, nondistended. No organomegaly noted, normoactive bowel sounds. Musculoskeletal: No edema, cyanosis, or clubbing. Neuro: Alert, answering all questions appropriately. Cranial nerves grossly intact. Skin: No rashes or petechiae noted. Psych: Normal affect.Anxious.   LAB RESULTS:  Lab Results  Component Value Date   NA 133 (L) 09/27/2016   K 3.6 09/27/2016   CL 102 09/27/2016   CO2 23 09/27/2016   GLUCOSE 99 09/27/2016   BUN 26 (H) 09/27/2016   CREATININE 1.63 (H) 09/27/2016   CALCIUM 8.6 (L) 09/27/2016   PROT 5.9 (L) 09/21/2016   ALBUMIN 4.1 09/21/2016   AST 28 09/21/2016   ALT 25 09/21/2016   ALKPHOS 147 (H) 09/21/2016   BILITOT <0.2 09/21/2016   GFRNONAA 32 (L) 09/27/2016   GFRAA 37 (L) 09/27/2016    Lab Results  Component Value Date   WBC 5.7 12/27/2016   NEUTROABS 4.3 12/27/2016     HGB 9.3 (L) 12/27/2016   HCT 27.5 (L) 12/27/2016   MCV 92.4 12/27/2016   PLT 227 12/27/2016   Lab Results  Component Value Date   IRON 100 12/09/2016   TIBC 255 12/09/2016   IRONPCTSAT 39 (H) 12/09/2016   Lab Results  Component Value Date   FERRITIN 94 12/09/2016     STUDIES: No results found.  ASSESSMENT: Anemia in chronic kidney disease, unspecified stage.  PLAN:    1. Anemia in chronic kidney disease, unspecified stage: Patient's iron stores are within normal limits. She had a normal colonoscopy and EGD on Sep 07, 2016. CT of the abdomen and pelvis was essentially within normal limits. Patient has some mild chronic renal insufficiency with an appropriate erythropoietin level that is likely contributing. Her reticulocyte count is inappropriately low for this level of anemia. Remainder of her blood work was either negative or within normal limits.   She does not require a bone marrow biopsy at this time, but would consider one in the future if her anemia does not improve with Procrit.  It appears patient will likely require Procrit every 3-4 weeks. 2. Chronic renal insufficiency: Continue follow up with nephrology.  3. Hypertension: Blood pressure significantly elevated today. She cannot receive Procrit at this time. Continue follow-up with nephrology and PCP.  Approximately 30 minutes was spent in discussion of which greater than 50% was consultation.  Patient expressed understanding and was in agreement with this plan. She also understands that She can call clinic at any time with any questions, concerns, or complaints.   Beckey Rutter, NP  12/27/16  Patient was seen and evaluated independently and I agree with the assessment and plan as dictated above. Patient could not receive her Procrit today secondary to a significantly elevated blood pressure. She was given the option of either returning tomorrow prior to her beach trip or 10-14 days when she returns with laboratory work  and additional [place]. Keep her previously scheduled follow-up in September.  Lloyd Huger, MD 12/27/16 12:48 PM

## 2016-12-27 ENCOUNTER — Inpatient Hospital Stay (HOSPITAL_BASED_OUTPATIENT_CLINIC_OR_DEPARTMENT_OTHER): Payer: Medicare Other | Admitting: Oncology

## 2016-12-27 ENCOUNTER — Inpatient Hospital Stay: Payer: Medicare Other

## 2016-12-27 ENCOUNTER — Encounter: Payer: Self-pay | Admitting: Oncology

## 2016-12-27 VITALS — BP 190/75 | HR 60 | Wt 110.5 lb

## 2016-12-27 DIAGNOSIS — N183 Chronic kidney disease, stage 3 (moderate): Secondary | ICD-10-CM

## 2016-12-27 DIAGNOSIS — N189 Chronic kidney disease, unspecified: Principal | ICD-10-CM

## 2016-12-27 DIAGNOSIS — D631 Anemia in chronic kidney disease: Secondary | ICD-10-CM

## 2016-12-27 DIAGNOSIS — Z79899 Other long term (current) drug therapy: Secondary | ICD-10-CM | POA: Diagnosis not present

## 2016-12-27 DIAGNOSIS — Z86010 Personal history of colonic polyps: Secondary | ICD-10-CM | POA: Diagnosis not present

## 2016-12-27 DIAGNOSIS — I129 Hypertensive chronic kidney disease with stage 1 through stage 4 chronic kidney disease, or unspecified chronic kidney disease: Secondary | ICD-10-CM | POA: Diagnosis not present

## 2016-12-27 DIAGNOSIS — F419 Anxiety disorder, unspecified: Secondary | ICD-10-CM | POA: Diagnosis not present

## 2016-12-27 DIAGNOSIS — K219 Gastro-esophageal reflux disease without esophagitis: Secondary | ICD-10-CM | POA: Diagnosis not present

## 2016-12-27 LAB — FERRITIN: Ferritin: 70 ng/mL (ref 11–307)

## 2016-12-27 LAB — IRON AND TIBC
Iron: 131 ug/dL (ref 28–170)
SATURATION RATIOS: 51 % — AB (ref 10.4–31.8)
TIBC: 257 ug/dL (ref 250–450)
UIBC: 126 ug/dL

## 2016-12-27 LAB — CBC WITH DIFFERENTIAL/PLATELET
BASOS ABS: 0.1 10*3/uL (ref 0–0.1)
BASOS PCT: 1 %
EOS ABS: 0.3 10*3/uL (ref 0–0.7)
EOS PCT: 5 %
HCT: 27.5 % — ABNORMAL LOW (ref 35.0–47.0)
Hemoglobin: 9.3 g/dL — ABNORMAL LOW (ref 12.0–16.0)
LYMPHS PCT: 13 %
Lymphs Abs: 0.7 10*3/uL — ABNORMAL LOW (ref 1.0–3.6)
MCH: 31.3 pg (ref 26.0–34.0)
MCHC: 33.8 g/dL (ref 32.0–36.0)
MCV: 92.4 fL (ref 80.0–100.0)
MONO ABS: 0.4 10*3/uL (ref 0.2–0.9)
Monocytes Relative: 6 %
Neutro Abs: 4.3 10*3/uL (ref 1.4–6.5)
Neutrophils Relative %: 75 %
PLATELETS: 227 10*3/uL (ref 150–440)
RBC: 2.98 MIL/uL — ABNORMAL LOW (ref 3.80–5.20)
RDW: 19.3 % — AB (ref 11.5–14.5)
WBC: 5.7 10*3/uL (ref 3.6–11.0)

## 2016-12-27 NOTE — Progress Notes (Signed)
Patient's bp is elevated today and she is not able to receive the procrit injection.  Her nephrologist, Dr. Candiss Norse, recently discontinued one of her bp meds due to low bp.  Advised patient to call Dr. Candiss Norse to inform him of the high bp readings.  She has rescheduled the procrit injection to tomorrow.

## 2016-12-28 ENCOUNTER — Emergency Department: Payer: Medicare Other

## 2016-12-28 ENCOUNTER — Other Ambulatory Visit: Payer: Self-pay | Admitting: Family Medicine

## 2016-12-28 ENCOUNTER — Other Ambulatory Visit: Payer: Self-pay

## 2016-12-28 ENCOUNTER — Encounter: Payer: Self-pay | Admitting: Emergency Medicine

## 2016-12-28 ENCOUNTER — Emergency Department
Admission: EM | Admit: 2016-12-28 | Discharge: 2016-12-28 | Disposition: A | Discharge status: Home / Self Care | Payer: Medicare Other | Attending: Emergency Medicine | Admitting: Emergency Medicine

## 2016-12-28 ENCOUNTER — Inpatient Hospital Stay: Payer: Medicare Other

## 2016-12-28 DIAGNOSIS — Z791 Long term (current) use of non-steroidal anti-inflammatories (NSAID): Secondary | ICD-10-CM | POA: Insufficient documentation

## 2016-12-28 DIAGNOSIS — Z79899 Other long term (current) drug therapy: Secondary | ICD-10-CM | POA: Diagnosis not present

## 2016-12-28 DIAGNOSIS — Z7982 Long term (current) use of aspirin: Secondary | ICD-10-CM | POA: Diagnosis not present

## 2016-12-28 DIAGNOSIS — G8929 Other chronic pain: Secondary | ICD-10-CM

## 2016-12-28 DIAGNOSIS — N183 Chronic kidney disease, stage 3 unspecified: Secondary | ICD-10-CM

## 2016-12-28 DIAGNOSIS — R51 Headache: Principal | ICD-10-CM

## 2016-12-28 DIAGNOSIS — I129 Hypertensive chronic kidney disease with stage 1 through stage 4 chronic kidney disease, or unspecified chronic kidney disease: Secondary | ICD-10-CM | POA: Insufficient documentation

## 2016-12-28 DIAGNOSIS — R918 Other nonspecific abnormal finding of lung field: Secondary | ICD-10-CM | POA: Diagnosis not present

## 2016-12-28 DIAGNOSIS — I10 Essential (primary) hypertension: Secondary | ICD-10-CM | POA: Diagnosis not present

## 2016-12-28 DIAGNOSIS — I160 Hypertensive urgency: Secondary | ICD-10-CM | POA: Insufficient documentation

## 2016-12-28 LAB — CBC WITH DIFFERENTIAL/PLATELET
Basophils Absolute: 0.1 10*3/uL (ref 0–0.1)
Basophils Relative: 1 %
Eosinophils Absolute: 0.3 10*3/uL (ref 0–0.7)
Eosinophils Relative: 5 %
HEMATOCRIT: 29.5 % — AB (ref 35.0–47.0)
HEMOGLOBIN: 9.9 g/dL — AB (ref 12.0–16.0)
LYMPHS ABS: 0.7 10*3/uL — AB (ref 1.0–3.6)
Lymphocytes Relative: 12 %
MCH: 31.3 pg (ref 26.0–34.0)
MCHC: 33.5 g/dL (ref 32.0–36.0)
MCV: 93.5 fL (ref 80.0–100.0)
MONO ABS: 0.4 10*3/uL (ref 0.2–0.9)
MONOS PCT: 7 %
NEUTROS ABS: 4.3 10*3/uL (ref 1.4–6.5)
NEUTROS PCT: 75 %
Platelets: 215 10*3/uL (ref 150–440)
RBC: 3.16 MIL/uL — ABNORMAL LOW (ref 3.80–5.20)
RDW: 19.3 % — ABNORMAL HIGH (ref 11.5–14.5)
WBC: 5.8 10*3/uL (ref 3.6–11.0)

## 2016-12-28 LAB — COMPREHENSIVE METABOLIC PANEL
ALBUMIN: 4.3 g/dL (ref 3.5–5.0)
ALT: 13 U/L — AB (ref 14–54)
ANION GAP: 8 (ref 5–15)
AST: 19 U/L (ref 15–41)
Alkaline Phosphatase: 117 U/L (ref 38–126)
BUN: 46 mg/dL — ABNORMAL HIGH (ref 6–20)
CHLORIDE: 106 mmol/L (ref 101–111)
CO2: 19 mmol/L — AB (ref 22–32)
CREATININE: 1.94 mg/dL — AB (ref 0.44–1.00)
Calcium: 9.2 mg/dL (ref 8.9–10.3)
GFR calc non Af Amer: 25 mL/min — ABNORMAL LOW (ref >60)
GFR, EST AFRICAN AMERICAN: 29 mL/min — AB (ref >60)
GLUCOSE: 101 mg/dL — AB (ref 65–99)
Potassium: 4.5 mmol/L (ref 3.5–5.1)
SODIUM: 133 mmol/L — AB (ref 135–145)
Total Bilirubin: 0.5 mg/dL (ref 0.3–1.2)
Total Protein: 6.3 g/dL — ABNORMAL LOW (ref 6.5–8.1)

## 2016-12-28 LAB — TROPONIN I: Troponin I: <0.03 ng/mL (ref <0.03)

## 2016-12-28 MED ORDER — CLONIDINE HCL 0.1 MG PO TABS
0.2000 mg | ORAL_TABLET | Freq: Once | ORAL | Status: AC
  Administered 2016-12-28: 0.2 mg via ORAL
  Filled 2016-12-28: qty 2

## 2016-12-28 MED ORDER — HYDRALAZINE HCL 20 MG/ML IJ SOLN
20.0000 mg | Freq: Once | INTRAMUSCULAR | Status: AC
  Administered 2016-12-28: 20 mg via INTRAVENOUS
  Filled 2016-12-28: qty 1

## 2016-12-28 MED ORDER — IRBESARTAN 150 MG PO TABS
150.0000 mg | ORAL_TABLET | Freq: Once | ORAL | Status: AC
  Administered 2016-12-28: 150 mg via ORAL
  Filled 2016-12-28: qty 1

## 2016-12-28 MED ORDER — CARVEDILOL 25 MG PO TABS
25.0000 mg | ORAL_TABLET | Freq: Once | ORAL | Status: AC
  Administered 2016-12-28: 25 mg via ORAL
  Filled 2016-12-28: qty 1

## 2016-12-28 MED ORDER — HYDRALAZINE HCL 50 MG PO TABS: 50.0000 mg | ORAL_TABLET | Freq: Three times a day (TID) | ORAL | 1 refills | Status: DC

## 2016-12-28 MED ORDER — IRBESARTAN 300 MG PO TABS
300.0000 mg | ORAL_TABLET | Freq: Every day | ORAL | 1 refills | Status: DC
Start: 2016-12-28 — End: 2017-03-16

## 2016-12-28 NOTE — ED Provider Notes (Signed)
ED ECG REPORT I, Eula Listen, the attending physician, personally viewed and interpreted this ECG.   Date: 12/28/2016  EKG Time: 1856  Rate: 56  Rhythm: sinus bradycardia  Axis: leftward  Intervals:none  ST&T Change: Nonspecific T-wave inversion in V1, V2. No ST elevation.  This EKG is compared to 12/14/2016 and is grossly unchanged in morphology.    Eula Listen, MD 12/28/16 1901

## 2016-12-28 NOTE — ED Notes (Signed)
See triage note  [facility]   She was told her b/p was elevated    Went back again today and b/p remained elevated  Denies  Any specific pain

## 2016-12-28 NOTE — ED Notes (Signed)
Pt was given .02 clonidine at md's office today.  Pt sent to er for eval

## 2016-12-28 NOTE — ED Triage Notes (Signed)
Pt to ed with c/o HTN, sent from nephrologist office.  Pt denies chest pain, denies sob, denies headache.  States hx of same.

## 2016-12-28 NOTE — ED Provider Notes (Signed)
[facility] Emergency Department Provider Note  ____________________________________________  Time seen: Approximately 5:31 PM  I have reviewed the triage vital signs and the nursing notes.   HISTORY  Chief Complaint Hypertension    HPI Amy [name] is a 66 y.o. female who presents to emergency department with a complaint of hypertension. Per the patient, patient has a significant history of hypertension, anemia, see [name]. She is being followed by multiple specialists for her ongoing hypertension, continue failure, anemia. Yesterday she presented to her hematologist for iron injection. Her blood pressure at that time was 170/100 and she was unable to receive injection. She presented to hematology again today. Patient's blood pressure was elevated and she was sent to her nephrologist for further management of hypertension refractory to medication. At nephrology she was given 2 doses of IV clonidine. Patient reports that her blood pressure continued to rise and nephrologist the patient to emergency department for further evaluation. Patient reports at this time that she does have a headache that is different than her normal baseline headache. She states that it is not the worst headache of her life, it was not thunderclap, but headache is worse and "different" than her normal baseline headaches. Patient denies any visual changes at this time. She denies any numbness or tingling in extremities. She denies any weakness in one extremity versus other. Patient denies any trauma to the head. Patient is also endorsing chest tightness/pressure. Patient reports that she has had intermittent chest pain for which she was evaluated by cardiology. Patient reports that all of the testing returned with reassuring results. At this time, patient states that it is not "pain" so much chest tightness/pressure. She denies any radiation to the jaw or bilateral upper extremities. She denies any  associated shortness of breath. She denies any URI symptoms of nasal congestion, sore throat, cough. She denies any trauma to the chest. At this time, patient denies any nausea, vomiting, abdominal pain, flank pain, hematuria, polyuria, dysuria, constipation, diarrhea.  Patient has had significant hypertension that has been managed with calcium channel blockers, beta blockers, diuretics, ARBS. Patient reports that she had one episode of hypotension, with a pressure of 108/56. At that time, her primary care for reduced her Avapro dosing and told her to stop her hydralazine on a daily basis and to take it only as needed. Patient reports that since then she has had readings that are all consistently 588 systolic or greater. Patient reports that she took her hydralazine yesterday due to the elevated reading in hematology.   Past Medical History:  Diagnosis Date  . Anemia    a. 08/2016 Guaiac + stool. EGD w/ gastritis.  . Anxiety   . CKD (chronic kidney disease), stage III   . Colon polyp    a. 08/2016 Colonoscopy.  . Gastritis    a. 08/2016 EGD: Gastritis-->PPI.  [name] GERD (gastroesophageal reflux disease)   . History of chicken pox   . History of measles as a child   . Hypertension   . Hyponatremia    a. 08/2016 in setting of HCTZ Rx.  . Multiple thyroid nodules     Patient Active Problem List   Diagnosis Date Noted  . Normocytic anemia 10/14/2016  . Anemia in chronic renal disease 10/10/2016  . Leg swelling   . Intractable episodic headache   . Gastritis   . History of adenomatous polyp of colon 09/07/2016  . Benign neoplasm of cecum   . Rectal polyp   . Abdominal  pain 09/05/2016  . Iron deficiency anemia 09/05/2016  . Shoulder pain 05/13/2016  . Chronic kidney disease (CKD), active medical management without dialysis, stage 3 (moderate) 02/05/2016  . Chronic headache 12/11/2015  . Multiple thyroid nodules 07/04/2015  . Right thyroid nodule 06/27/2015  . Allergic rhinitis, seasonal  12/03/2014  . Fever blister 12/03/2014  . Herniated lumbar intervertebral disc 12/03/2014  . Recurrent sinus infections 12/03/2014  . Insomnia 11/05/2014  . Acquired scoliosis 06/26/2014  . Intervertebral disc stenosis of neural canal of lumbar region 06/20/2014  . Lumbar facet arthropathy ([place]) 05/21/2014  . Degenerative spondylolisthesis 03/12/2014  . OP (osteoporosis) 08/10/2013  . Idiopathic peripheral neuropathy 03/24/2009  . Edema 12/27/2007  . Headache, tension-type 10/25/2006  . Essential hypertension 10/24/2006  . Anxiety 05/03/1998  . Esophageal reflux 05/03/1998    Past Surgical History:  Procedure Laterality Date  . ABDOMINAL HYSTERECTOMY  2003   due to heavy periods and clotting during menses  . BREAST LUMPECTOMY Left 1990  . CHOLECYSTECTOMY  2010  . COLONOSCOPY WITH PROPOFOL N/A 09/07/2016   Procedure: COLONOSCOPY WITH PROPOFOL;  Surgeon: Lucilla Lame, MD;  Location: [facility] ENDOSCOPY;  Service: Endoscopy;  Laterality: N/A;  . DILATION AND CURETTAGE OF UTERUS  1985  . ESOPHAGOGASTRODUODENOSCOPY (EGD) WITH PROPOFOL N/A 09/07/2016   Procedure: ESOPHAGOGASTRODUODENOSCOPY (EGD) WITH PROPOFOL;  Surgeon: Lucilla Lame, MD;  Location: [facility] ENDOSCOPY;  Service: Endoscopy;  Laterality: N/A;  . GALLBLADDER SURGERY  2012  . KNEE SURGERY     Dr. Tamala Julian  . MRI, Lumbar spine  07/23/2009   Multilevel annular bulge and disc protrusion at L2-L3, L3-L4, L4-L5, and L5-S1  . TONSILLECTOMY    . TONSILLECTOMY AND ADENOIDECTOMY  1970  . UPPER GI ENDOSCOPY  03/02/2006   H. Pylori negative; Dr. Allen Norris    Prior to Admission medications   Medication Sig Start Date End Date Taking? Authorizing Provider  alendronate (FOSAMAX) 70 MG tablet Take 1 tablet (70 mg total) by mouth every 7 (seven) days. Take with a full glass of water on an empty stomach. 12/13/16   [name], MD  amLODipine (NORVASC) 2.5 MG tablet amlodipine 2.5 mg tablet    [provider]  aspirin EC 81 MG EC tablet Take  1 tablet (81 mg total) by mouth daily. 09/27/16   Dustin Flock, MD  buPROPion [facility]) 75 MG tablet TAKE 2 TABLETS BY MOUTH DAILY 10/25/16   [name], MD  butalbital-acetaminophen-caffeine (FIORICET, ESGIC) [phone] MG tablet TAKE 1-2 TABLETS BY MOUTH EVERY 4-6 HOURS AS NEEDED FOR PAIN, MAX 8 TABLETS DAILY 09/29/16   [name], MD  carvedilol (COREG) 25 MG tablet TAKE 1 TABLET(25 MG) BY MOUTH TWICE DAILY WITH A MEAL 10/25/16   [name], MD  cloNIDine (CATAPRES) 0.2 MG tablet Take 1 tablet (0.2 mg total) by mouth 3 (three) times daily. 09/27/16   Dustin Flock, MD  furosemide (LASIX) 20 MG tablet furosemide 20 mg tablet  TK 1 T PO QD PRF SWELLING    [provider]  hydrALAZINE (APRESOLINE) 50 MG tablet hydralazine 50 mg tablet    [provider]  hydrALAZINE (APRESOLINE) 50 MG tablet Take 1 tablet (50 mg total) by mouth 3 (three) times daily. 12/28/16   Camron Monday, Charline Bills, PA-C  irbesartan (AVAPRO) 150 MG tablet Take 1 tablet (150 mg total) by mouth daily. 12/13/16   [name], MD  irbesartan (AVAPRO) 300 MG tablet Take 1 tablet (300 mg total) by mouth daily. 12/28/16 12/28/17  Kj Imbert,  Charline Bills, PA-C  LORazepam (ATIVAN) 1 MG tablet lorazepam 1 mg tablet  TAKE 1 TABLET BY MOUTH TWICE DAILY AS NEEDED    [provider]  meloxicam (MOBIC) 15 MG tablet Mobic 15 mg tablet  Take 1 tablet every day by oral route.    [provider]  pantoprazole (PROTONIX) 40 MG tablet Take 1 tablet (40 mg total) by mouth 2 (two) times daily before a meal. 10/13/16   Fisher, Kirstie Peri, MD  ranitidine (ZANTAC) 150 MG tablet Take 150 mg by mouth 2 (two) times daily.    [provider]  valACYclovir (VALTREX) 1000 MG tablet 2 tablets twice a day for 1 day as needed for cold sores 10/13/16   [name], MD  zolpidem (AMBIEN) 10 MG tablet TAKE 1 TABLET BY MOUTH EVERY DAY AT BEDTIME AS NEEDED FOR SLEEP 08/24/16   [name], MD     Allergies Cephalosporins; Clarithromycin; Lunesta  [eszopiclone]; Penicillin v; and Sulfa antibiotics  Family History  Problem Relation Age of Onset  . Pancreatic cancer Father   . Congestive Heart Failure Maternal Grandmother   . Cancer Paternal Grandfather   . Heart disease Maternal Uncle     Social History Social History  Substance Use Topics  . Smoking status: Never Smoker  . Smokeless tobacco: Never Used  . Alcohol use 0.0 oz/week     Comment: few glasses a wine/wk.     Review of Systems  Constitutional: No fever/chills Eyes: No visual changes. No discharge ENT: No upper respiratory complaints. Cardiovascular: no distinct chest pain. Positive for chest tightness/pressure Respiratory: no cough. No SOB. Gastrointestinal: No abdominal pain.  No nausea, no vomiting.  No diarrhea.  No constipation. Genitourinary: Negative for dysuria. No hematuria Musculoskeletal: Negative for musculoskeletal pain. Skin: Negative for rash, abrasions, lacerations, ecchymosis. Neurological: Positive for headache but denies focal weakness or numbness. 10-point ROS otherwise negative.  ____________________________________________   PHYSICAL EXAM:  VITAL SIGNS: ED Triage Vitals  Enc Vitals Group     BP 12/28/16 1705 (!) 196/70     Pulse --      Resp 12/28/16 1705 18     Temp 12/28/16 1705 97.9 F (36.6 C)     Temp Source 12/28/16 1705 Oral     SpO2 12/28/16 1705 100 %     Weight 12/28/16 1706 110 lb (49.9 kg)     Height --      Head Circumference --      Peak Flow --      Pain Score 12/28/16 1705 0     Pain Loc --      Pain Edu? --      Excl. in [place]? --      Constitutional: Alert and oriented. Well appearing and in no acute distress. Eyes: Conjunctivae are normal. PERRL. EOMI.No papilledema. Head: Atraumatic. ENT:      Ears:       Nose: No congestion/rhinnorhea.      Mouth/Throat: Mucous membranes are moist.  Neck: No stridor. Neck is supple with full range of motion   Cardiovascular: Normal rate, regular rhythm. Normal S1 and S2.  Good peripheral circulation. Respiratory: Normal respiratory effort without tachypnea or retractions. Lungs CTAB. Good air entry to the bases with no decreased or absent breath sounds. Gastrointestinal: Bowel sounds 4 quadrants. Soft and nontender to palpation. No guarding or rigidity. No palpable masses. No distention. No CVA tenderness. Musculoskeletal: Full range of motion to all extremities. No gross deformities appreciated. Neurologic:  Normal speech and language. No gross focal neurologic deficits are appreciated. Cranial nerves II through XII grossly intact. Skin:  Skin is warm, dry and intact. No rash noted. Psychiatric: Mood and affect are normal. Speech and behavior are normal. Patient exhibits appropriate insight and judgement.   ____________________________________________   LABS (all labs ordered are listed, but only abnormal results are displayed)  Labs Reviewed  COMPREHENSIVE METABOLIC PANEL - Abnormal; Notable for the following:       Result Value   Sodium 133 (*)    CO2 19 (*)    Glucose, Bld 101 (*)    BUN 46 (*)    Creatinine, Ser 1.94 (*)    Total Protein 6.3 (*)    ALT 13 (*)    GFR calc non Af Amer 25 (*)    GFR calc Af Amer 29 (*)    All other components within normal limits  CBC WITH DIFFERENTIAL/PLATELET - Abnormal; Notable for the following:    RBC 3.16 (*)    Hemoglobin 9.9 (*)    HCT 29.5 (*)    RDW 19.3 (*)    Lymphs Abs 0.7 (*)    All other components within normal limits  TROPONIN I  URINALYSIS, COMPLETE (UACMP) WITH MICROSCOPIC   ____________________________________________  EKG  ED ECG REPORT I, Charline Bills Rexton Greulich,  personally viewed and interpreted this ECG.   Date: 12/15/2016  EKG Time: 1856  Rate: 56 bpm  Rhythm: sinus bradycardia, sinus arrhythmia  Axis: Leftward  Intervals:none  ST&T Change: No ST elevations or depressions noted. Nonspecific T-wave changes in  the septal leads.  ____________________________________________  RADIOLOGY Diamantina Providence Tellis Spivak, personally viewed and evaluated these images (plain radiographs) as part of my medical decision making, as well as reviewing the written report by the radiologist.  Dg Chest 2 View  Result Date: 12/28/2016 CLINICAL DATA:  Chest tightness and hypertension. EXAM: CHEST  2 VIEW COMPARISON:  09/25/2016 FINDINGS: Heart is top-normal in size. No aortic aneurysm. Emphysematous hyperinflation of the lungs without pneumonic consolidation, effusion or pneumothorax. Resolution of bilateral pleural effusions since prior. No acute nor suspicious osseous abnormality. IMPRESSION: Emphysematous hyperinflation of the lungs. No acute cardiopulmonary disease. Electronically Signed   By: Ashley Royalty M.D.   On: 12/28/2016 18:34   Ct Head Wo Contrast  Result Date: 12/28/2016 CLINICAL DATA:  Headache EXAM: CT HEAD WITHOUT CONTRAST TECHNIQUE: Contiguous axial images were obtained from the base of the skull through the vertex without intravenous contrast. COMPARISON:  None. FINDINGS: Brain: Mild age related volume loss. Small low-density area in the right periventricular white matter, likely old lacunar infarct or chronic microvascular disease. No acute infarct, hemorrhage or hydrocephalus. Vascular: No hyperdense vessel or unexpected calcification. Skull: No acute calvarial abnormality. Sinuses/Orbits: Visualized paranasal sinuses and mastoids clear. Orbital soft tissues unremarkable. Other: None. IMPRESSION: No acute intracranial abnormality. Electronically Signed   By: Rolm Baptise M.D.   On: 12/28/2016 18:35    ____________________________________________    PROCEDURES  Procedure(s) performed:    Procedures    Medications  cloNIDine (CATAPRES) tablet 0.2 mg (0.2 mg Oral Given 12/28/16 2120)  irbesartan (AVAPRO) tablet 150 mg (150 mg Oral Given 12/28/16 2124)  carvedilol (COREG) tablet 25 mg (25 mg Oral Given  12/28/16 2120)  hydrALAZINE (APRESOLINE) injection 20 mg (20 mg Intravenous Given 12/28/16 2120)     ____________________________________________   INITIAL IMPRESSION / ASSESSMENT AND PLAN / ED COURSE  Pertinent labs & imaging results that were available during my care of  the patient were reviewed by me and considered in my medical decision making (see chart for details).  Review of the [place] CSRS was performed in accordance of the [place] prior to dispensing any controlled drugs.  Clinical Course as of Dec 30 11  Tue Dec 28, 2016  1823 Patient presented to the emergency department for complaint of hypertension refractory to medication. This time, patient had been assessed by her hematologist and nephrologist today. She has received 2 doses of clonidine prior to arrival in the emergency department. Her blood pressure continued to increase even with IV medication. At this time, nephrology sent patient to the emergency department for further evaluation. At this time, patient complains of hypertension, headache that is changed from her baseline headaches, chest tightness and pressure. At this time, patient will be evaluated with labs, imaging. Physical exam is reassuring at this time.  [JC]    Clinical Course User Index [JC] Yailine Ballard, Charline Bills, PA-C    Patient's diagnosis is consistent with Hypertensive urgency. Patient presented to the emergency department with elevated hypertension from her nephrologist office. Per the patient, she has experienced higher than normal blood pressure readings over the last 2 days. She originally presented to hematology for her injection yesterday but was unable to receive injection due to her hypertension. Patient returned today and continued to have elevated reading 170/90 at hematology. She was sent to her nephrologist office who gave her 2 doses of clonidine in the office without alleviation of her hypertension. Patient presents with a history of hypertension that  has been difficult to control with Medication. Patient is on Norvasc, clonidine, carvedilol, Avapro, hydralazine. Patient had recently seen her primary care provider who had discontinued her hydralazine as well as lowered her Avapro dosing by half. This was after patient had one episode of low blood pressure reading 110/56. Since then, patient has also been taking NSAID therapy due to a joint complaint. At this time, patient was endorsing headache, chest tightness, hypertension. EKG report returns with no acute findings. Labs are reassuring. Patient does have kidney failure on labs. When comparing previous results, there is a slight worsening of kidney function, however patient has been taking NSAID therapy over the past 2 weeks which is unknown by her nephrologist. At this time, I feel slight change in kidney function is likely due to increased NSAID therapy. At this time, I feel the patient's hypotension is likely anxiety related as well as poorly controlled after stopping 1 medication entirely as well as cutting another medication by half. At this time, I have instructed patient to return to the original dosing of her hypertensive medications as this was well controlling her hypertension. At this time, patient was given her nighttime dose of daily antihypertensives as well as a dose of hydralazine and returning patient to full dose of Avapro. 45 minutes after administration of medication, patient's blood pressure had dropped from 196/84 to 138/60. I feel that patient's blood pressure again has been poorly controlled status post medication changes. At this time with a reassuring workup, no other symptoms, reassuring improvement of blood pressure after getting nighttime doses and full dosing of previously discontinued medications, patient will be discharged home. Patient reports that she is able to follow-up with her nephrologist in the morning which she is highly encouraged to do. If patient experiences any other  symptoms or complaints overnight she is instructed to return to the emergency department.. Patient will be discharged home with prescriptions for Avapro 300 mg daily and hydralazine 50  mg 3 times a day as needed. These prescriptions return patient to previously scheduled antihypertensive medication therapy prior to patient developing worsening hypertension. Patient is to follow up with her nephrologist, cardiologist, hematologist, primary care as instructed.  Patient is given ED precautions to return to the ED for any worsening or new symptoms.     ____________________________________________  FINAL CLINICAL IMPRESSION(S) / ED DIAGNOSES  Final diagnoses:  Hypertensive urgency      NEW MEDICATIONS STARTED DURING THIS VISIT:  Discharge Medication List as of 12/28/2016 10:45 PM    START taking these medications   Details  !! hydrALAZINE (APRESOLINE) 50 MG tablet Take 1 tablet (50 mg total) by mouth 3 (three) times daily., Starting Tue 12/28/2016, Print    !! irbesartan (AVAPRO) 300 MG tablet Take 1 tablet (300 mg total) by mouth daily., Starting Tue 12/28/2016, Until Wed 12/28/2017, Print     !! - Potential duplicate medications found. Please discuss with provider.          This chart was dictated using voice recognition software/Dragon. Despite best efforts to proofread, errors can occur which can change the meaning. Any change was purely unintentional.    Darletta Moll, PA-C 12/29/16 0013    Arta Silence, MD 12/29/16 [phone]

## 2016-12-29 DIAGNOSIS — M179 Osteoarthritis of knee, unspecified: Secondary | ICD-10-CM | POA: Insufficient documentation

## 2016-12-29 DIAGNOSIS — M1710 Unilateral primary osteoarthritis, unspecified knee: Secondary | ICD-10-CM | POA: Insufficient documentation

## 2016-12-29 DIAGNOSIS — M1712 Unilateral primary osteoarthritis, left knee: Secondary | ICD-10-CM | POA: Diagnosis not present

## 2016-12-29 DIAGNOSIS — M170 Bilateral primary osteoarthritis of knee: Secondary | ICD-10-CM | POA: Diagnosis not present

## 2016-12-29 NOTE — Telephone Encounter (Signed)
Please call in Fioricet.  

## 2017-01-11 ENCOUNTER — Ambulatory Visit (INDEPENDENT_AMBULATORY_CARE_PROVIDER_SITE_OTHER): Payer: Medicare Other | Admitting: Family Medicine

## 2017-01-11 ENCOUNTER — Encounter: Payer: Self-pay | Admitting: Family Medicine

## 2017-01-11 VITALS — BP 170/80 | HR 62 | Temp 98.8°F | Resp 16 | Ht 64.0 in | Wt 105.0 lb

## 2017-01-11 DIAGNOSIS — D649 Anemia, unspecified: Secondary | ICD-10-CM | POA: Diagnosis not present

## 2017-01-11 DIAGNOSIS — I10 Essential (primary) hypertension: Secondary | ICD-10-CM

## 2017-01-11 DIAGNOSIS — N183 Chronic kidney disease, stage 3 unspecified: Secondary | ICD-10-CM

## 2017-01-11 MED ORDER — HYDRALAZINE HCL 50 MG PO TABS: 25.0000 mg | ORAL_TABLET | Freq: Three times a day (TID) | ORAL | 1 refills | Status: DC

## 2017-01-11 MED ORDER — CLONIDINE HCL 0.2 MG/24HR TD PTWK: 0.2000 mg | MEDICATED_PATCH | TRANSDERMAL | 12 refills | Status: DC

## 2017-01-11 MED ORDER — AMLODIPINE BESYLATE 2.5 MG PO TABS
5.0000 mg | ORAL_TABLET | Freq: Every day | ORAL | 0 refills | Status: DC
Start: 2017-01-11 — End: 2017-03-16

## 2017-01-11 NOTE — Progress Notes (Signed)
Patient: OZELLA COMINS Female    DOB: 1951-03-14   66 y.o.   MRN: [id number] Visit Date: 01/11/2017  Today's Provider: Lelon Huh, MD   Chief Complaint  Patient presents with  . Follow-up  . Hospitalization Follow-up   Subjective:    HPI  Follow up ER visit  Patient was seen in ER for elevated blood pressure on 12/28/2016. She was treated for Hypertensive Urgency. Patient was discharged home with increase dose of irbesartan to 300mg  daily and Hydraliazine 50mg  three times daily. Her home BP has remained labile since ER visit, with SBP often in the 170s-180s.  She states she feels very light headed and spaced out after taking hydralazine and sometimes skips doses due to side effects.   She has had complicated course the last few months and is being followed by nephrology and hematology for insidious onset CKD and unexplained anemia.   ----------------------------------------------------------------      Allergies  Allergen Reactions  . Cephalosporins   . Clarithromycin   . Lunesta  [Eszopiclone]     Heart racing  . Penicillin V     Has patient had a PCN reaction causing immediate rash, facial/tongue/throat swelling, SOB or lightheadedness with hypotension: Yes Has patient had a PCN reaction causing severe rash involving mucus membranes or skin necrosis: No Has patient had a PCN reaction that required hospitalization No Has patient had a PCN reaction occurring within the last 10 years: No If all of the above answers are "NO", then may proceed with Cephalosporin use.  . Sulfa Antibiotics      Current Outpatient Prescriptions:  .  alendronate (FOSAMAX) 70 MG tablet, Take 1 tablet (70 mg total) by mouth every 7 (seven) days. Take with a full glass of water on an empty stomach., Disp: 12 tablet, Rfl: 4 .  amLODipine (NORVASC) 2.5 MG tablet, amlodipine 2.5 mg tablet, Disp: , Rfl:  .  aspirin EC 81 MG EC tablet, Take 1 tablet (81 mg total) by mouth daily., Disp: ,  Rfl:  .  buPROPion (WELLBUTRIN) 75 MG tablet, TAKE 2 TABLETS BY MOUTH DAILY, Disp: 180 tablet, Rfl: 2 .  butalbital-acetaminophen-caffeine (FIORICET, ESGIC) 50-325-40 MG tablet, TAKE 1 TO 2 TABLETS BY MOUTH EVERY 4-6 HOURS AS NEEDED FOR PAIN. MAX OF 8 PER DAY, Disp: 150 tablet, Rfl: 4 .  carvedilol (COREG) 25 MG tablet, TAKE 1 TABLET(25 MG) BY MOUTH TWICE DAILY WITH A MEAL, Disp: 60 tablet, Rfl: 2 .  cloNIDine (CATAPRES) 0.2 MG tablet, Take 1 tablet (0.2 mg total) by mouth 3 (three) times daily., Disp: 60 tablet, Rfl: 11 .  furosemide (LASIX) 20 MG tablet, furosemide 20 mg tablet  TK 1 T PO QD PRF SWELLING, Disp: , Rfl:  .  hydrALAZINE (APRESOLINE) 50 MG tablet, Take 1 tablet (50 mg total) by mouth 3 (three) times daily., Disp: 90 tablet, Rfl: 1 .  irbesartan (AVAPRO) 300 MG tablet, Take 1 tablet (300 mg total) by mouth daily., Disp: 30 tablet, Rfl: 1 .  LORazepam (ATIVAN) 1 MG tablet, lorazepam 1 mg tablet  TAKE 1 TABLET BY MOUTH TWICE DAILY AS NEEDED, Disp: , Rfl:  .  pantoprazole (PROTONIX) 40 MG tablet, Take 1 tablet (40 mg total) by mouth 2 (two) times daily before a meal., Disp: 60 tablet, Rfl: 5 .  ranitidine (ZANTAC) 150 MG tablet, Take 150 mg by mouth 2 (two) times daily., Disp: , Rfl:  .  valACYclovir (VALTREX) 1000 MG tablet, 2 tablets twice a  day for 1 day as needed for cold sores, Disp: 20 tablet, Rfl: 1 .  zolpidem (AMBIEN) 10 MG tablet, TAKE 1 TABLET BY MOUTH EVERY DAY AT BEDTIME AS NEEDED FOR SLEEP, Disp: 30 tablet, Rfl: 5 .  meloxicam (MOBIC) 15 MG tablet, Mobic 15 mg tablet  Take 1 tablet every day by oral route., Disp: , Rfl:   Review of Systems  Constitutional: Negative for appetite change, chills, fatigue and fever.  Respiratory: Negative for chest tightness and shortness of breath.   Cardiovascular: Negative for chest pain and palpitations.  Gastrointestinal: Negative for abdominal pain, nausea and vomiting.  Neurological: Positive for dizziness and light-headedness.  Negative for weakness.    Social History  Substance Use Topics  . Smoking status: Never Smoker  . Smokeless tobacco: Never Used  . Alcohol use 0.0 oz/week     Comment: few glasses a wine/wk.   Objective:   BP (!) 170/80 (BP Location: Right Arm, Patient Position: Sitting, Cuff Size: Normal)   Pulse 62   Temp 98.8 F (37.1 C) (Oral)   Resp 16   Ht 5\' 4"  (1.626 m)   Wt 105 lb (47.6 kg)   SpO2 98%   BMI 18.02 kg/m  Vitals:   01/11/17 1617  BP: (!) 170/80  Pulse: 62  Resp: 16  Temp: 98.8 F (37.1 C)  TempSrc: Oral  SpO2: 98%  Weight: 105 lb (47.6 kg)  Height: 5\' 4"  (1.626 m)     Physical Exam   General Appearance:    Alert, cooperative, no distress  Eyes:    PERRL, conjunctiva/corneas clear, EOM's intact       Lungs:     Clear to auscultation bilaterally, respirations unlabored  Heart:    Regular rate and rhythm  Neurologic:   Awake, alert, oriented x 3. No apparent focal neurological           defect.          Assessment & Plan:     1. Malignant hypertension Labile BP and not tolerated current medications cocktail. Change oral clonidine to clonidine patch Increase amlodipine to 2 x 2.5mg  daily Change scheduled hydralazine to 25 mg (1/2 x 50) and take the other half as needed when BP>180/110  - Catecholamines, fractionated, Urine, 24 hour - Renal function panel - VAS US RENAL ARTERY DUPLEX; Future  2. Chronic kidney disease (CKD), active medical management without dialysis, stage 3 (moderate)  - VAS US RENAL ARTERY DUPLEX; Future  3. Normocytic anemia Continue routine follow up hematology.         Lelon Huh, MD  [facility]

## 2017-01-11 NOTE — Patient Instructions (Addendum)
   Stop taking clonidine tablets as soon as you apply the first clonidine patch   Take 1/2 tablet 50mg  hydralazine three times daily. Take any extra 1/2 tablet whenever your BP>180/110   Take 2 amlodipine 2.5mg  tablets daily   Take one 300mg  irbesartan tablet daily

## 2017-01-12 ENCOUNTER — Telehealth: Payer: Self-pay | Admitting: Family Medicine

## 2017-01-12 DIAGNOSIS — I10 Essential (primary) hypertension: Secondary | ICD-10-CM

## 2017-01-12 NOTE — Telephone Encounter (Signed)
[id number] for ultrasound renal artery stenosis

## 2017-01-13 DIAGNOSIS — I10 Essential (primary) hypertension: Secondary | ICD-10-CM | POA: Diagnosis not present

## 2017-01-13 NOTE — Telephone Encounter (Signed)
Order in epic. 

## 2017-01-14 LAB — RENAL FUNCTION PANEL
ALBUMIN MSPROF: 4.1 g/dL (ref 3.6–5.1)
BUN / CREAT RATIO: 15 (calc) (ref 6–22)
BUN: 49 mg/dL — ABNORMAL HIGH (ref 7–25)
CHLORIDE: 103 mmol/L (ref 98–110)
CO2: 17 mmol/L — AB (ref 20–32)
CREATININE: 3.28 mg/dL — AB (ref 0.50–0.99)
Calcium: 9.1 mg/dL (ref 8.6–10.4)
GLUCOSE: 96 mg/dL (ref 65–99)
PHOSPHORUS: 4.1 mg/dL (ref 2.1–4.3)
Potassium: 5.1 mmol/L (ref 3.5–5.3)
Sodium: 131 mmol/L — ABNORMAL LOW (ref 135–146)

## 2017-01-15 NOTE — Progress Notes (Signed)
[place]  [phone] [phone]  ID: Amy [name] OB: [birth year]  MR#: [id number]  NOB#:[id number]  Patient Care Team: [name], MD as PCP - General (Family Medicine) Thornton Park, MD as Referring Physician (Orthopedic Surgery) Lloyd Huger, MD as Consulting Physician (Oncology) Minna Merritts, MD as Consulting Physician (Cardiology) Murlean Iba, MD as Referring Physician (Nephrology)  CHIEF COMPLAINT:  Anemia in chronic kidney disease, unspecified stage.  INTERVAL HISTORY: Patient returns to clinic today for repeat laboratory work and further evaluation. She has noticed increased weakness and fatigue over the past week, but otherwise has felt well. She has no neurologic complaints. She denies any fevers. She has a good appetite and denies weight loss. She has no chest pain or shortness of breath. She denies any nausea, vomiting, constipation, or diarrhea. She has no urinary complaints. Patient otherwise feels well and offers no further specific complaints.  REVIEW OF SYSTEMS:   Review of Systems  Constitutional: Positive for malaise/fatigue. Negative for fever and weight loss.  Respiratory: Negative.  Negative for cough and shortness of breath.   Cardiovascular: Negative.  Negative for chest pain and leg swelling.  Gastrointestinal: Negative.  Negative for abdominal pain, blood in stool and melena.  Genitourinary: Negative.   Musculoskeletal: Negative.   Skin: Negative.  Negative for rash.  Neurological: Positive for weakness.  Psychiatric/Behavioral: The patient is nervous/anxious.     As per HPI. Otherwise, a complete review of systems is negative.  PAST MEDICAL HISTORY: Past Medical History:  Diagnosis Date  . Anemia    a. 08/2016 Guaiac + stool. EGD w/ gastritis.  . Anxiety   . CKD (chronic kidney disease), stage III   . Colon polyp    a. 08/2016 Colonoscopy.  . Gastritis    a. 08/2016 EGD:  Gastritis-->PPI.  [name] GERD (gastroesophageal reflux disease)   . History of chicken pox   . History of measles as a child   . Hypertension   . Hyponatremia    a. 08/2016 in setting of HCTZ Rx.  . Multiple thyroid nodules     PAST SURGICAL HISTORY: Past Surgical History:  Procedure Laterality Date  . ABDOMINAL HYSTERECTOMY  2003   due to heavy periods and clotting during menses  . BREAST LUMPECTOMY Left 1990  . CHOLECYSTECTOMY  2010  . COLONOSCOPY WITH PROPOFOL N/A 09/07/2016   Procedure: COLONOSCOPY WITH PROPOFOL;  Surgeon: Lucilla Lame, MD;  Location: [facility] ENDOSCOPY;  Service: Endoscopy;  Laterality: N/A;  . DILATION AND CURETTAGE OF UTERUS  1985  . ESOPHAGOGASTRODUODENOSCOPY (EGD) WITH PROPOFOL N/A 09/07/2016   Procedure: ESOPHAGOGASTRODUODENOSCOPY (EGD) WITH PROPOFOL;  Surgeon: Lucilla Lame, MD;  Location: [facility] ENDOSCOPY;  Service: Endoscopy;  Laterality: N/A;  . GALLBLADDER SURGERY  2012  . KNEE SURGERY     Dr. Tamala Julian  . MRI, Lumbar spine  07/23/2009   Multilevel annular bulge and disc protrusion at L2-L3, L3-L4, L4-L5, and L5-S1  . TONSILLECTOMY    . TONSILLECTOMY AND ADENOIDECTOMY  1970  . UPPER GI ENDOSCOPY  03/02/2006   H. Pylori negative; Dr. Allen Norris    FAMILY HISTORY: Family History  Problem Relation Age of Onset  . Pancreatic cancer Father   . Congestive Heart Failure Maternal Grandmother   . Cancer Paternal Grandfather   . Heart disease Maternal Uncle     ADVANCED DIRECTIVES (Y/N):  N  HEALTH MAINTENANCE: Social History  Substance Use Topics  . Smoking status: Never Smoker  . Smokeless tobacco: Never Used  .  Alcohol use 0.0 oz/week     Comment: few glasses a wine/wk.     Colonoscopy:  PAP:  Bone density:  Lipid panel:  Allergies  Allergen Reactions  . Cephalosporins   . Clarithromycin   . Lunesta  [Eszopiclone]     Heart racing  . Penicillin V     Has patient had a PCN reaction causing immediate rash, facial/tongue/throat swelling, SOB or  lightheadedness with hypotension: Yes Has patient had a PCN reaction causing severe rash involving mucus membranes or skin necrosis: No Has patient had a PCN reaction that required hospitalization No Has patient had a PCN reaction occurring within the last 10 years: No If all of the above answers are "NO", then may proceed with Cephalosporin use.  . Sulfa Antibiotics     Current Outpatient Prescriptions  Medication Sig Dispense Refill  . amLODipine (NORVASC) 2.5 MG tablet Take 2 tablets (5 mg total) by mouth daily. 1 tablet 0  . aspirin EC 81 MG EC tablet Take 1 tablet (81 mg total) by mouth daily.    [name] buPROPion (WELLBUTRIN) 75 MG tablet TAKE 2 TABLETS BY MOUTH DAILY 180 tablet 2  . butalbital-acetaminophen-caffeine (FIORICET, ESGIC) 50-325-40 MG tablet TAKE 1 TO 2 TABLETS BY MOUTH EVERY 4-6 HOURS AS NEEDED FOR PAIN. MAX OF 8 PER DAY 150 tablet 4  . carvedilol (COREG) 25 MG tablet TAKE 1 TABLET(25 MG) BY MOUTH TWICE DAILY WITH A MEAL 60 tablet 2  . cloNIDine (CATAPRES-TTS-2) 0.2 mg/24hr patch Place 1 patch (0.2 mg total) onto the skin once a week. 4 patch 12  . furosemide (LASIX) 20 MG tablet furosemide 20 mg tablet  TK 1 T PO QD PRF SWELLING    . hydrALAZINE (APRESOLINE) 50 MG tablet Take 0.5 tablets (25 mg total) by mouth 3 (three) times daily. Take an extra 1/2 tablet if your SBP>180 or DBP>110 3 tablet 1  . irbesartan (AVAPRO) 300 MG tablet Take 1 tablet (300 mg total) by mouth daily. 30 tablet 1  . LORazepam (ATIVAN) 1 MG tablet lorazepam 1 mg tablet  TAKE 1 TABLET BY MOUTH TWICE DAILY AS NEEDED    . ranitidine (ZANTAC) 150 MG tablet Take 150 mg by mouth 2 (two) times daily.    [name] zolpidem (AMBIEN) 10 MG tablet TAKE 1 TABLET BY MOUTH EVERY DAY AT BEDTIME AS NEEDED FOR SLEEP 30 tablet 5  . alendronate (FOSAMAX) 70 MG tablet Take 1 tablet (70 mg total) by mouth every 7 (seven) days. Take with a full glass of water on an empty stomach. (Patient not taking: Reported on 01/18/2017) 12 tablet 4   . meloxicam (MOBIC) 15 MG tablet Mobic 15 mg tablet  Take 1 tablet every day by oral route.    . pantoprazole (PROTONIX) 40 MG tablet Take 1 tablet (40 mg total) by mouth 2 (two) times daily before a meal. (Patient not taking: Reported on 01/18/2017) 60 tablet 5  . valACYclovir (VALTREX) 1000 MG tablet 2 tablets twice a day for 1 day as needed for cold sores (Patient not taking: Reported on 01/18/2017) 20 tablet 1   No current facility-administered medications for this visit.     OBJECTIVE: Vitals:   01/18/17 1403  BP: 113/76  Pulse: 64  Resp: 18  Temp: (!) 97.5 F (36.4 C)     Body mass index is 18.19 kg/m.    ECOG FS:0 - Asymptomatic  General: Well-developed, well-nourished, no acute distress. Eyes: Pink conjunctiva, anicteric sclera. Lungs: Clear to auscultation bilaterally. Heart:  Regular rate and rhythm. No rubs, murmurs, or gallops. Abdomen: Soft, nontender, nondistended. No organomegaly noted, normoactive bowel sounds. Musculoskeletal: No edema, cyanosis, or clubbing. Neuro: Alert, answering all questions appropriately. Cranial nerves grossly intact. Skin: No rashes or petechiae noted. Psych: Normal affect.   LAB RESULTS:  Lab Results  Component Value Date   NA 131 (L) 01/13/2017   K 5.1 01/13/2017   CL 103 01/13/2017   CO2 17 (L) 01/13/2017   GLUCOSE 96 01/13/2017   BUN 49 (H) 01/13/2017   CREATININE 3.28 (H) 01/13/2017   CALCIUM 9.1 01/13/2017   PROT 6.3 (L) 12/28/2016   ALBUMIN 4.3 12/28/2016   AST 19 12/28/2016   ALT 13 (L) 12/28/2016   ALKPHOS 117 12/28/2016   BILITOT 0.5 12/28/2016   GFRNONAA 25 (L) 12/28/2016   GFRAA 29 (L) 12/28/2016    Lab Results  Component Value Date   WBC 6.2 01/18/2017   NEUTROABS 4.5 01/18/2017   HGB 8.2 (L) 01/18/2017   HCT 23.5 (L) 01/18/2017   MCV 90.0 01/18/2017   PLT 227 01/18/2017   Lab Results  Component Value Date   IRON 106 01/18/2017   TIBC 230 (L) 01/18/2017   IRONPCTSAT 46 (H) 01/18/2017   Lab Results   Component Value Date   FERRITIN 129 01/18/2017     STUDIES: Dg Chest 2 View  Result Date: 12/28/2016 CLINICAL DATA:  Chest tightness and hypertension. EXAM: CHEST  2 VIEW COMPARISON:  09/25/2016 FINDINGS: Heart is top-normal in size. No aortic aneurysm. Emphysematous hyperinflation of the lungs without pneumonic consolidation, effusion or pneumothorax. Resolution of bilateral pleural effusions since prior. No acute nor suspicious osseous abnormality. IMPRESSION: Emphysematous hyperinflation of the lungs. No acute cardiopulmonary disease. Electronically Signed   By: Ashley Royalty M.D.   On: 12/28/2016 18:34   Ct Head Wo Contrast  Result Date: 12/28/2016 CLINICAL DATA:  Headache EXAM: CT HEAD WITHOUT CONTRAST TECHNIQUE: Contiguous axial images were obtained from the base of the skull through the vertex without intravenous contrast. COMPARISON:  None. FINDINGS: Brain: Mild age related volume loss. Small low-density area in the right periventricular white matter, likely old lacunar infarct or chronic microvascular disease. No acute infarct, hemorrhage or hydrocephalus. Vascular: No hyperdense vessel or unexpected calcification. Skull: No acute calvarial abnormality. Sinuses/Orbits: Visualized paranasal sinuses and mastoids clear. Orbital soft tissues unremarkable. Other: None. IMPRESSION: No acute intracranial abnormality. Electronically Signed   By: Rolm Baptise M.D.   On: 12/28/2016 18:35   US Renal Artery Stenosis  Result Date: 01/19/2017 CLINICAL DATA:  Malignant hypertension EXAM: RENAL DUPLEX DOPPLER ULTRASOUND COMPARISON:  None. FINDINGS: Right Kidney: Length: 9.8 cm. Echogenicity within normal limits. No mass or hydronephrosis visualized. Left Kidney: Length: 10.2 cm. Echogenicity within normal limits. No mass or hydronephrosis visualized. Bladder:  Decompressed. RENAL DUPLEX ULTRASOUND Right Renal Artery Velocities: Origin:  97 cm/sec Mid:  102 cm/sec Hilum:  105 cm/sec Interlobar:  39 cm/sec  Arcuate:  32 Cm/sec Left Renal Artery Velocities: Origin:  106 cm/sec Mid:  118 cm/sec Hilum:  110 cm/sec Interlobar:  35 cm/sec Arcuate:  24 cm/sec Aortic Velocity:  73 Cm/sec Right Renal-Aortic Ratios: Origin: 1.3 Mid:  1.4 Hilum: 1.4 Interlobar: 0.5 Arcuate: 0.4 Left Renal-Aortic Ratios: Origin: 1.5 Mid: 1.6 Hilum: 1.5 Interlobar: 0.5 Arcuate: 0.3 Other findings: Right and left renal veins are patent by color Doppler and Doppler waveform imaging. IMPRESSION: No evidence of significant renal artery stenosis. Electronically Signed   By: Marybelle Killings M.D.   On: 01/19/2017 12:55  ASSESSMENT: Anemia in chronic kidney disease, unspecified stage.  PLAN:    1. Anemia in chronic kidney disease, unspecified stage: Patient's iron stores are within normal limits. She had a normal colonoscopy and EGD on Sep 07, 2016. CT of the abdomen and pelvis was essentially within normal limits. Patient has some mild chronic renal insufficiency with an inappropriately low erythropoietin level that is likely contributing. Her reticulocyte count is also inappropriately low for this level of anemia. Remainder of her blood work was either negative or within normal limits. Proceed with 40,000 units subcutaneous Procrit today. Clinic every 2 weeks for laboratory work and Procrit if her hemoglobin falls below 10.0. Patient will then return to clinic in 3 months for further evaluation.  2. Chronic renal insufficiency: Consider referral to nephrology for further evaluation.  Approximately 30 minutes was spent in discussion of which greater than 50% was consultation.  Patient expressed understanding and was in agreement with this plan. She also understands that She can call clinic at any time with any questions, concerns, or complaints.   Lloyd Huger, MD   01/21/2017 10:09 PM

## 2017-01-17 ENCOUNTER — Telehealth: Payer: Self-pay

## 2017-01-17 ENCOUNTER — Other Ambulatory Visit: Payer: Self-pay | Admitting: Family Medicine

## 2017-01-17 ENCOUNTER — Other Ambulatory Visit: Payer: Self-pay | Admitting: *Deleted

## 2017-01-17 DIAGNOSIS — D649 Anemia, unspecified: Secondary | ICD-10-CM

## 2017-01-17 DIAGNOSIS — I10 Essential (primary) hypertension: Secondary | ICD-10-CM | POA: Diagnosis not present

## 2017-01-17 NOTE — Telephone Encounter (Signed)
Patient was notified.

## 2017-01-17 NOTE — Telephone Encounter (Signed)
-----   Message from [name], MD sent at 01/17/2017  8:30 AM EDT ----- Please advise patient that kidney functions have gotten worse, need to cut back to 150mg  irbesartan daily. Need to increase water intake.  Also, need to increase amlodipine for better BP control, please double check and see how much she is currently taking

## 2017-01-17 NOTE — Telephone Encounter (Signed)
Advised patient of results. Patient verbally understands all med changes below. Patient reports that she is currently taking Amlodipine 2.5mg  daily. She also mentions that when she was here in the office you told her that the clonidine patch was to be taken DAILY. She reports that she received WEEKLY patches at the pharmacy. Patient reports that she has not started on this medication because she is unsure on how often she is supposed to be taking it. Please advise. Thanks!

## 2017-01-17 NOTE — Telephone Encounter (Signed)
The patch is once a week, not once a day. Needs to double dose of amlodipine to 2 tablets a day. Need to stop by to check BP in 3-4 days.

## 2017-01-18 ENCOUNTER — Inpatient Hospital Stay: Payer: Medicare Other

## 2017-01-18 ENCOUNTER — Inpatient Hospital Stay (HOSPITAL_BASED_OUTPATIENT_CLINIC_OR_DEPARTMENT_OTHER): Payer: Medicare Other | Admitting: Oncology

## 2017-01-18 ENCOUNTER — Inpatient Hospital Stay: Payer: Medicare Other | Attending: Oncology

## 2017-01-18 VITALS — BP 113/76 | HR 64 | Temp 97.5°F | Resp 18 | Wt 106.0 lb

## 2017-01-18 DIAGNOSIS — Z86010 Personal history of colonic polyps: Secondary | ICD-10-CM | POA: Insufficient documentation

## 2017-01-18 DIAGNOSIS — Z79899 Other long term (current) drug therapy: Secondary | ICD-10-CM

## 2017-01-18 DIAGNOSIS — K219 Gastro-esophageal reflux disease without esophagitis: Secondary | ICD-10-CM | POA: Insufficient documentation

## 2017-01-18 DIAGNOSIS — Z8619 Personal history of other infectious and parasitic diseases: Secondary | ICD-10-CM | POA: Diagnosis not present

## 2017-01-18 DIAGNOSIS — D631 Anemia in chronic kidney disease: Secondary | ICD-10-CM

## 2017-01-18 DIAGNOSIS — I129 Hypertensive chronic kidney disease with stage 1 through stage 4 chronic kidney disease, or unspecified chronic kidney disease: Secondary | ICD-10-CM | POA: Diagnosis not present

## 2017-01-18 DIAGNOSIS — N183 Chronic kidney disease, stage 3 unspecified: Secondary | ICD-10-CM

## 2017-01-18 DIAGNOSIS — D649 Anemia, unspecified: Secondary | ICD-10-CM

## 2017-01-18 DIAGNOSIS — D509 Iron deficiency anemia, unspecified: Secondary | ICD-10-CM

## 2017-01-18 DIAGNOSIS — Z7982 Long term (current) use of aspirin: Secondary | ICD-10-CM | POA: Insufficient documentation

## 2017-01-18 DIAGNOSIS — F419 Anxiety disorder, unspecified: Secondary | ICD-10-CM | POA: Diagnosis not present

## 2017-01-18 LAB — CBC WITH DIFFERENTIAL/PLATELET
Basophils Absolute: 0 10*3/uL (ref 0–0.1)
Basophils Relative: 1 %
EOS PCT: 5 %
Eosinophils Absolute: 0.3 10*3/uL (ref 0–0.7)
HCT: 23.5 % — ABNORMAL LOW (ref 35.0–47.0)
Hemoglobin: 8.2 g/dL — ABNORMAL LOW (ref 12.0–16.0)
LYMPHS ABS: 1 10*3/uL (ref 1.0–3.6)
LYMPHS PCT: 16 %
MCH: 31.4 pg (ref 26.0–34.0)
MCHC: 34.9 g/dL (ref 32.0–36.0)
MCV: 90 fL (ref 80.0–100.0)
MONO ABS: 0.4 10*3/uL (ref 0.2–0.9)
MONOS PCT: 7 %
Neutro Abs: 4.5 10*3/uL (ref 1.4–6.5)
Neutrophils Relative %: 73 %
Platelets: 227 10*3/uL (ref 150–440)
RBC: 2.61 MIL/uL — ABNORMAL LOW (ref 3.80–5.20)
RDW: 17.4 % — ABNORMAL HIGH (ref 11.5–14.5)
WBC: 6.2 10*3/uL (ref 3.6–11.0)

## 2017-01-18 LAB — FERRITIN: Ferritin: 129 ng/mL (ref 11–307)

## 2017-01-18 LAB — IRON AND TIBC
Iron: 106 ug/dL (ref 28–170)
Saturation Ratios: 46 % — ABNORMAL HIGH (ref 10.4–31.8)
TIBC: 230 ug/dL — ABNORMAL LOW (ref 250–450)
UIBC: 124 ug/dL

## 2017-01-18 MED ORDER — EPOETIN ALFA 40000 UNIT/ML IJ SOLN
40000.0000 [IU] | Freq: Once | INTRAMUSCULAR | Status: AC
  Administered 2017-01-18: 40000 [IU] via SUBCUTANEOUS
  Filled 2017-01-18: qty 1

## 2017-01-18 NOTE — Progress Notes (Signed)
Patient is here for follow up. She has no major complaints

## 2017-01-19 ENCOUNTER — Ambulatory Visit
Admission: RE | Admit: 2017-01-19 | Discharge: 2017-01-19 | Disposition: A | Discharge status: Home / Self Care | Payer: Medicare Other | Source: Ambulatory Visit | Attending: Family Medicine | Admitting: Family Medicine

## 2017-01-19 DIAGNOSIS — I10 Essential (primary) hypertension: Secondary | ICD-10-CM | POA: Insufficient documentation

## 2017-01-20 ENCOUNTER — Ambulatory Visit: Payer: Medicare Other

## 2017-01-20 ENCOUNTER — Telehealth: Payer: Self-pay | Admitting: Family Medicine

## 2017-01-20 DIAGNOSIS — I10 Essential (primary) hypertension: Secondary | ICD-10-CM

## 2017-01-20 NOTE — Telephone Encounter (Signed)
Order for evaluation of resistant hypertension

## 2017-01-21 ENCOUNTER — Telehealth: Payer: Self-pay

## 2017-01-21 NOTE — Telephone Encounter (Signed)
Caren Griffins with [facility] found out that urine is in process still and should get results maybe beginning of next week or so.Kris Mouton, RMA

## 2017-01-22 LAB — CATECHOLAMINES, FRACTIONATED, URINE, 24 HOUR
CREATININE, URINE MG/DAY-CATEUR: 0.65 g/(24.h) (ref 0.50–2.15)
Calculated Total (E+NE): 8 mcg/24 h — ABNORMAL LOW (ref 26–121)
DOPAMINE, 24 HR URINE: 40 ug/(24.h) — AB (ref 52–480)
Epinephrine, 24 hr Urine: 8 mcg/24 h (ref 2–24)
Volume, Urine-VMAUR: 1950 mL

## 2017-01-24 ENCOUNTER — Other Ambulatory Visit: Payer: Self-pay | Admitting: Family Medicine

## 2017-01-24 DIAGNOSIS — J019 Acute sinusitis, unspecified: Secondary | ICD-10-CM

## 2017-01-31 ENCOUNTER — Other Ambulatory Visit: Payer: Self-pay | Admitting: *Deleted

## 2017-01-31 DIAGNOSIS — D649 Anemia, unspecified: Secondary | ICD-10-CM

## 2017-02-01 ENCOUNTER — Other Ambulatory Visit: Payer: Self-pay | Admitting: Family Medicine

## 2017-02-01 ENCOUNTER — Inpatient Hospital Stay: Payer: Medicare Other | Attending: Oncology

## 2017-02-01 ENCOUNTER — Inpatient Hospital Stay: Payer: Medicare Other

## 2017-02-01 VITALS — BP 160/78

## 2017-02-01 DIAGNOSIS — Z86010 Personal history of colonic polyps: Secondary | ICD-10-CM | POA: Diagnosis not present

## 2017-02-01 DIAGNOSIS — K219 Gastro-esophageal reflux disease without esophagitis: Secondary | ICD-10-CM | POA: Insufficient documentation

## 2017-02-01 DIAGNOSIS — Z7982 Long term (current) use of aspirin: Secondary | ICD-10-CM | POA: Diagnosis not present

## 2017-02-01 DIAGNOSIS — D649 Anemia, unspecified: Secondary | ICD-10-CM

## 2017-02-01 DIAGNOSIS — I129 Hypertensive chronic kidney disease with stage 1 through stage 4 chronic kidney disease, or unspecified chronic kidney disease: Secondary | ICD-10-CM | POA: Diagnosis not present

## 2017-02-01 DIAGNOSIS — D631 Anemia in chronic kidney disease: Secondary | ICD-10-CM | POA: Insufficient documentation

## 2017-02-01 DIAGNOSIS — G4700 Insomnia, unspecified: Secondary | ICD-10-CM

## 2017-02-01 DIAGNOSIS — Z8619 Personal history of other infectious and parasitic diseases: Secondary | ICD-10-CM | POA: Insufficient documentation

## 2017-02-01 DIAGNOSIS — F419 Anxiety disorder, unspecified: Secondary | ICD-10-CM | POA: Diagnosis not present

## 2017-02-01 DIAGNOSIS — Z79899 Other long term (current) drug therapy: Secondary | ICD-10-CM | POA: Diagnosis not present

## 2017-02-01 DIAGNOSIS — N183 Chronic kidney disease, stage 3 (moderate): Secondary | ICD-10-CM | POA: Insufficient documentation

## 2017-02-01 DIAGNOSIS — D509 Iron deficiency anemia, unspecified: Secondary | ICD-10-CM

## 2017-02-01 LAB — CBC WITH DIFFERENTIAL/PLATELET
BASOS ABS: 0.1 10*3/uL (ref 0–0.1)
Basophils Relative: 1 %
EOS PCT: 4 %
Eosinophils Absolute: 0.2 10*3/uL (ref 0–0.7)
HEMATOCRIT: 24.2 % — AB (ref 35.0–47.0)
Hemoglobin: 8.2 g/dL — ABNORMAL LOW (ref 12.0–16.0)
LYMPHS ABS: 0.8 10*3/uL — AB (ref 1.0–3.6)
LYMPHS PCT: 18 %
MCH: 31.6 pg (ref 26.0–34.0)
MCHC: 34 g/dL (ref 32.0–36.0)
MCV: 93 fL (ref 80.0–100.0)
MONO ABS: 0.4 10*3/uL (ref 0.2–0.9)
MONOS PCT: 8 %
NEUTROS ABS: 3.1 10*3/uL (ref 1.4–6.5)
Neutrophils Relative %: 69 %
PLATELETS: 207 10*3/uL (ref 150–440)
RBC: 2.6 MIL/uL — ABNORMAL LOW (ref 3.80–5.20)
RDW: 16.9 % — AB (ref 11.5–14.5)
WBC: 4.5 10*3/uL (ref 3.6–11.0)

## 2017-02-01 LAB — IRON AND TIBC
IRON: 123 ug/dL (ref 28–170)
Saturation Ratios: 54 % — ABNORMAL HIGH (ref 10.4–31.8)
TIBC: 228 ug/dL — AB (ref 250–450)
UIBC: 105 ug/dL

## 2017-02-01 LAB — FERRITIN: FERRITIN: 55 ng/mL (ref 11–307)

## 2017-02-01 MED ORDER — EPOETIN ALFA 40000 UNIT/ML IJ SOLN
40000.0000 [IU] | Freq: Once | INTRAMUSCULAR | Status: AC
  Administered 2017-02-01: 40000 [IU] via SUBCUTANEOUS
  Filled 2017-02-01: qty 1

## 2017-02-02 NOTE — Telephone Encounter (Signed)
Please call in zolpidem   Please see if patient can get the labs drawn this week that were ordered on 9-20. I see her BP at hematologist is still running high and I'm worried her kidney functions may be getting worse.

## 2017-02-04 ENCOUNTER — Telehealth: Payer: Self-pay | Admitting: Family Medicine

## 2017-02-04 ENCOUNTER — Ambulatory Visit (INDEPENDENT_AMBULATORY_CARE_PROVIDER_SITE_OTHER): Payer: Medicare Other | Admitting: Family Medicine

## 2017-02-04 DIAGNOSIS — Z23 Encounter for immunization: Secondary | ICD-10-CM | POA: Diagnosis not present

## 2017-02-04 DIAGNOSIS — I10 Essential (primary) hypertension: Secondary | ICD-10-CM | POA: Diagnosis not present

## 2017-02-04 NOTE — Telephone Encounter (Signed)
Yes, she can get a flu shot

## 2017-02-04 NOTE — Telephone Encounter (Signed)
Patient was advised. Patient stated that she would come on over this morning to get blood drawn. Patient wants to know if you think it is ok for her to get a flu shot?

## 2017-02-04 NOTE — Telephone Encounter (Signed)
Pt advised-Anastasiya V Hopkins, RMA  

## 2017-02-04 NOTE — Telephone Encounter (Signed)
Please see if patient can get the labs drawn this week that were ordered on 9-20. I see her BP at hematologist is still running high and I'm worried her kidney functions may be getting worse.

## 2017-02-08 ENCOUNTER — Other Ambulatory Visit: Payer: Self-pay | Admitting: Family Medicine

## 2017-02-08 DIAGNOSIS — J019 Acute sinusitis, unspecified: Secondary | ICD-10-CM

## 2017-02-09 ENCOUNTER — Other Ambulatory Visit: Payer: Self-pay | Admitting: Family Medicine

## 2017-02-09 ENCOUNTER — Telehealth: Payer: Self-pay

## 2017-02-09 DIAGNOSIS — G4700 Insomnia, unspecified: Secondary | ICD-10-CM

## 2017-02-09 NOTE — Telephone Encounter (Signed)
Patient called requesting lab results. Results are in patient chart awaiting review.

## 2017-02-09 NOTE — Telephone Encounter (Signed)
Done. Prescription called into pharmacy.  

## 2017-02-09 NOTE — Telephone Encounter (Signed)
Please call in zolpidem  

## 2017-02-09 NOTE — Telephone Encounter (Signed)
Kidney functions have improved, but we are still waiting on results of aldosterone which is a measurement of hormone kidneys produce to control blood pressure.

## 2017-02-10 LAB — RENAL FUNCTION PANEL
ALBUMIN MSPROF: 3.8 g/dL (ref 3.6–5.1)
BUN / CREAT RATIO: 17 (calc) (ref 6–22)
BUN: 36 mg/dL — ABNORMAL HIGH (ref 7–25)
CALCIUM: 8.9 mg/dL (ref 8.6–10.4)
CHLORIDE: 100 mmol/L (ref 98–110)
CO2: 21 mmol/L (ref 20–32)
Creat: 2.15 mg/dL — ABNORMAL HIGH (ref 0.50–0.99)
Glucose, Bld: 88 mg/dL (ref 65–99)
POTASSIUM: 5.4 mmol/L — AB (ref 3.5–5.3)
Phosphorus: 3.2 mg/dL (ref 2.1–4.3)
SODIUM: 129 mmol/L — AB (ref 135–146)

## 2017-02-10 LAB — ALDOSTERONE + RENIN ACTIVITY W/ RATIO
ALDO / PRA Ratio: 13.8 Ratio (ref 0.9–28.9)
ALDOSTERONE, SERUM: 8 ng/dL
Renin Activity: 0.58 ng/mL/h (ref 0.25–5.82)

## 2017-02-10 NOTE — Telephone Encounter (Signed)
Patient was notified.

## 2017-02-11 ENCOUNTER — Telehealth: Payer: Self-pay

## 2017-02-11 DIAGNOSIS — J0140 Acute pansinusitis, unspecified: Secondary | ICD-10-CM

## 2017-02-11 NOTE — Telephone Encounter (Signed)
Patient is requesting an rx for amoxicillin? Patient states she has sinus pressure and nasal congestion. Advised pt she will need an office visit. However she wants to see if Dr. Caryn Section will send in rx without her having to come in. Please advise? [facility] [employer]

## 2017-02-11 NOTE — Telephone Encounter (Signed)
LMTCB 02/11/2017  Thanks,   -Mickel Baas

## 2017-02-11 NOTE — Telephone Encounter (Signed)
-----   Message from [name], MD sent at 02/11/2017 10:31 AM EDT ----- Kidney functions have improved, but are still not back to normal. All labs related to adrenal gland function are normal. Continue current medications. Need follow up for BP scheduled in the next 1-2 weeks

## 2017-02-11 NOTE — Telephone Encounter (Signed)
Patient was notified of results. Appt scheduled.

## 2017-02-14 ENCOUNTER — Other Ambulatory Visit: Payer: Self-pay | Admitting: *Deleted

## 2017-02-14 DIAGNOSIS — D649 Anemia, unspecified: Secondary | ICD-10-CM

## 2017-02-15 ENCOUNTER — Inpatient Hospital Stay: Payer: Medicare Other

## 2017-02-15 DIAGNOSIS — F419 Anxiety disorder, unspecified: Secondary | ICD-10-CM | POA: Diagnosis not present

## 2017-02-15 DIAGNOSIS — D649 Anemia, unspecified: Secondary | ICD-10-CM

## 2017-02-15 DIAGNOSIS — N183 Chronic kidney disease, stage 3 (moderate): Secondary | ICD-10-CM | POA: Diagnosis not present

## 2017-02-15 DIAGNOSIS — Z79899 Other long term (current) drug therapy: Secondary | ICD-10-CM | POA: Diagnosis not present

## 2017-02-15 DIAGNOSIS — I129 Hypertensive chronic kidney disease with stage 1 through stage 4 chronic kidney disease, or unspecified chronic kidney disease: Secondary | ICD-10-CM | POA: Diagnosis not present

## 2017-02-15 DIAGNOSIS — K219 Gastro-esophageal reflux disease without esophagitis: Secondary | ICD-10-CM | POA: Diagnosis not present

## 2017-02-15 DIAGNOSIS — D631 Anemia in chronic kidney disease: Secondary | ICD-10-CM | POA: Diagnosis not present

## 2017-02-15 LAB — CBC WITH DIFFERENTIAL/PLATELET
BASOS ABS: 0 10*3/uL (ref 0–0.1)
Basophils Relative: 1 %
EOS PCT: 3 %
Eosinophils Absolute: 0.1 10*3/uL (ref 0–0.7)
HEMATOCRIT: 29.4 % — AB (ref 35.0–47.0)
Hemoglobin: 9.7 g/dL — ABNORMAL LOW (ref 12.0–16.0)
LYMPHS ABS: 0.6 10*3/uL — AB (ref 1.0–3.6)
LYMPHS PCT: 13 %
MCH: 31.4 pg (ref 26.0–34.0)
MCHC: 33 g/dL (ref 32.0–36.0)
MCV: 95.2 fL (ref 80.0–100.0)
MONO ABS: 0.3 10*3/uL (ref 0.2–0.9)
Monocytes Relative: 7 %
NEUTROS ABS: 3.4 10*3/uL (ref 1.4–6.5)
Neutrophils Relative %: 76 %
PLATELETS: 267 10*3/uL (ref 150–440)
RBC: 3.09 MIL/uL — AB (ref 3.80–5.20)
RDW: 15.2 % — AB (ref 11.5–14.5)
WBC: 4.5 10*3/uL (ref 3.6–11.0)

## 2017-02-15 LAB — IRON AND TIBC
Iron: 154 ug/dL (ref 28–170)
SATURATION RATIOS: 58 % — AB (ref 10.4–31.8)
TIBC: 265 ug/dL (ref 250–450)
UIBC: 111 ug/dL

## 2017-02-15 LAB — FERRITIN: Ferritin: 26 ng/mL (ref 11–307)

## 2017-02-15 MED ORDER — AMOXICILLIN-POT CLAVULANATE 875-125 MG PO TABS: 1.0000 | ORAL_TABLET | Freq: Two times a day (BID) | ORAL | 0 refills | Status: DC

## 2017-02-17 ENCOUNTER — Inpatient Hospital Stay: Payer: Medicare Other

## 2017-02-17 VITALS — BP 170/83

## 2017-02-17 DIAGNOSIS — N183 Chronic kidney disease, stage 3 (moderate): Secondary | ICD-10-CM | POA: Diagnosis not present

## 2017-02-17 DIAGNOSIS — D631 Anemia in chronic kidney disease: Secondary | ICD-10-CM | POA: Diagnosis not present

## 2017-02-17 DIAGNOSIS — F419 Anxiety disorder, unspecified: Secondary | ICD-10-CM | POA: Diagnosis not present

## 2017-02-17 DIAGNOSIS — Z79899 Other long term (current) drug therapy: Secondary | ICD-10-CM | POA: Diagnosis not present

## 2017-02-17 DIAGNOSIS — D509 Iron deficiency anemia, unspecified: Secondary | ICD-10-CM

## 2017-02-17 DIAGNOSIS — K219 Gastro-esophageal reflux disease without esophagitis: Secondary | ICD-10-CM | POA: Diagnosis not present

## 2017-02-17 DIAGNOSIS — I129 Hypertensive chronic kidney disease with stage 1 through stage 4 chronic kidney disease, or unspecified chronic kidney disease: Secondary | ICD-10-CM | POA: Diagnosis not present

## 2017-02-17 MED ORDER — EPOETIN ALFA 40000 UNIT/ML IJ SOLN
40000.0000 [IU] | Freq: Once | INTRAMUSCULAR | Status: AC
  Administered 2017-02-17: 40000 [IU] via SUBCUTANEOUS

## 2017-02-17 NOTE — Progress Notes (Signed)
MD approves patient to receive Procrit with BP of 170/83.

## 2017-02-21 ENCOUNTER — Ambulatory Visit: Payer: Self-pay | Admitting: Family Medicine

## 2017-02-22 ENCOUNTER — Other Ambulatory Visit: Payer: Self-pay | Admitting: Family Medicine

## 2017-02-22 DIAGNOSIS — G4700 Insomnia, unspecified: Secondary | ICD-10-CM

## 2017-02-22 MED ORDER — ZOLPIDEM TARTRATE 10 MG PO TABS: 10.0000 mg | ORAL_TABLET | Freq: Every evening | ORAL | 0 refills | Status: DC | PRN

## 2017-02-22 NOTE — Telephone Encounter (Signed)
Called in Rx and advised patient as below.

## 2017-02-22 NOTE — Telephone Encounter (Signed)
Please call in zolpidem and lorazepam.  Please advise patient needs to schedule follow up for BP before any additional refills.

## 2017-02-28 ENCOUNTER — Other Ambulatory Visit: Payer: Self-pay | Admitting: *Deleted

## 2017-02-28 DIAGNOSIS — D649 Anemia, unspecified: Secondary | ICD-10-CM

## 2017-03-01 ENCOUNTER — Inpatient Hospital Stay: Payer: Medicare Other

## 2017-03-01 DIAGNOSIS — N183 Chronic kidney disease, stage 3 (moderate): Secondary | ICD-10-CM | POA: Diagnosis not present

## 2017-03-01 DIAGNOSIS — K219 Gastro-esophageal reflux disease without esophagitis: Secondary | ICD-10-CM | POA: Diagnosis not present

## 2017-03-01 DIAGNOSIS — F419 Anxiety disorder, unspecified: Secondary | ICD-10-CM | POA: Diagnosis not present

## 2017-03-01 DIAGNOSIS — D631 Anemia in chronic kidney disease: Secondary | ICD-10-CM | POA: Diagnosis not present

## 2017-03-01 DIAGNOSIS — Z79899 Other long term (current) drug therapy: Secondary | ICD-10-CM | POA: Diagnosis not present

## 2017-03-01 DIAGNOSIS — I129 Hypertensive chronic kidney disease with stage 1 through stage 4 chronic kidney disease, or unspecified chronic kidney disease: Secondary | ICD-10-CM | POA: Diagnosis not present

## 2017-03-01 DIAGNOSIS — D649 Anemia, unspecified: Secondary | ICD-10-CM

## 2017-03-01 LAB — CBC WITH DIFFERENTIAL/PLATELET
BASOS PCT: 1 %
Basophils Absolute: 0.1 10*3/uL (ref 0–0.1)
EOS ABS: 0.2 10*3/uL (ref 0–0.7)
EOS PCT: 4 %
HCT: 32 % — ABNORMAL LOW (ref 35.0–47.0)
Hemoglobin: 10.6 g/dL — ABNORMAL LOW (ref 12.0–16.0)
Lymphocytes Relative: 14 %
Lymphs Abs: 0.6 10*3/uL — ABNORMAL LOW (ref 1.0–3.6)
MCH: 31.4 pg (ref 26.0–34.0)
MCHC: 33.2 g/dL (ref 32.0–36.0)
MCV: 94.7 fL (ref 80.0–100.0)
Monocytes Absolute: 0.4 10*3/uL (ref 0.2–0.9)
Monocytes Relative: 9 %
Neutro Abs: 3 10*3/uL (ref 1.4–6.5)
Neutrophils Relative %: 72 %
PLATELETS: 257 10*3/uL (ref 150–440)
RBC: 3.38 MIL/uL — AB (ref 3.80–5.20)
RDW: 14.1 % (ref 11.5–14.5)
WBC: 4.2 10*3/uL (ref 3.6–11.0)

## 2017-03-01 LAB — IRON AND TIBC
Iron: 240 ug/dL — ABNORMAL HIGH (ref 28–170)
SATURATION RATIOS: 87 % — AB (ref 10.4–31.8)
TIBC: 277 ug/dL (ref 250–450)
UIBC: 37 ug/dL

## 2017-03-01 LAB — FERRITIN: FERRITIN: 24 ng/mL (ref 11–307)

## 2017-03-02 ENCOUNTER — Ambulatory Visit (INDEPENDENT_AMBULATORY_CARE_PROVIDER_SITE_OTHER): Payer: Medicare Other | Admitting: Physician Assistant

## 2017-03-02 ENCOUNTER — Encounter: Payer: Self-pay | Admitting: Physician Assistant

## 2017-03-02 VITALS — BP 108/68 | HR 68 | Temp 98.5°F | Resp 16 | Wt 109.0 lb

## 2017-03-02 DIAGNOSIS — J0101 Acute recurrent maxillary sinusitis: Secondary | ICD-10-CM | POA: Diagnosis not present

## 2017-03-02 MED ORDER — AMOXICILLIN-POT CLAVULANATE ER 1000-62.5 MG PO TB12: 2.0000 | ORAL_TABLET | Freq: Two times a day (BID) | ORAL | 0 refills | Status: DC

## 2017-03-02 NOTE — Patient Instructions (Signed)
Delsym - dextromethorphan for cough   Sinusitis, Adult Sinusitis is soreness and inflammation of your sinuses. Sinuses are hollow spaces in the bones around your face. They are located:  Around your eyes.  In the middle of your forehead.  Behind your nose.  In your cheekbones.  Your sinuses and nasal passages are lined with a stringy fluid (mucus). Mucus normally drains out of your sinuses. When your nasal tissues get inflamed or swollen, the mucus can get trapped or blocked so air cannot flow through your sinuses. This lets bacteria, viruses, and funguses grow, and that leads to infection. Follow these instructions at home: Medicines  Take, use, or apply over-the-counter and prescription medicines only as told by your doctor. These may include nasal sprays.  If you were prescribed an antibiotic medicine, take it as told by your doctor. Do not stop taking the antibiotic even if you start to feel better. Hydrate and Humidify  Drink enough water to keep your pee (urine) clear or pale yellow.  Use a cool mist humidifier to keep the humidity level in your home above 50%.  Breathe in steam for 10-15 minutes, 3-4 times a day or as told by your doctor. You can do this in the bathroom while a hot shower is running.  Try not to spend time in cool or dry air. Rest  Rest as much as possible.  Sleep with your head raised (elevated).  Make sure to get enough sleep each night. General instructions  Put a warm, moist washcloth on your face 3-4 times a day or as told by your doctor. This will help with discomfort.  Wash your hands often with soap and water. If there is no soap and water, use hand sanitizer.  Do not smoke. Avoid being around people who are smoking (secondhand smoke).  Keep all follow-up visits as told by your doctor. This is important. Contact a doctor if:  You have a fever.  Your symptoms get worse.  Your symptoms do not get better within 10 days. Get help right  away if:  You have a very bad headache.  You cannot stop throwing up (vomiting).  You have pain or swelling around your face or eyes.  You have trouble seeing.  You feel confused.  Your neck is stiff.  You have trouble breathing. This information is not intended to replace advice given to you by your health care provider. Make sure you discuss any questions you have with your health care provider. Document Released: 10/06/2007 Document Revised: 12/14/2015 Document Reviewed: 02/12/2015 Elsevier Interactive Patient Education  [employer].

## 2017-03-02 NOTE — Progress Notes (Signed)
[place]  Chief Complaint  Patient presents with  . URI    Started about two weeks.   . Sinusitis    Subjective:    Patient ID: Amy [name], female    DOB: 1951-04-02, 66 y.o.   MRN: [id number]  Upper Respiratory Infection: Amy [name] is a40 y.o. female with a past medical history significant for recurrent sinusitis complaining of symptoms of a URI, possible sinusitis. Symptoms include congestion, cough and plugged sensation in both ears. Onset of symptoms was a few weeks ago, gradually worsening since that time. She also c/o bilateral ear pressure/pain, congestion, cough described as productive, nasal congestion and post nasal drip for the past 5 days .  She is drinking plenty of fluids. Evaluation to date: none. Treatment to date: none. The treatment has provided no relief. She says Dr. Caryn Section usually treats her with Augmentin 1000-62.5, 2 tablets twice daily. She says she tolerates this. She has been evaluated by ENT in the past with no further recommendations to pursue surgery.  Review of Systems  Constitutional: Positive for chills and fatigue. Negative for activity change, appetite change, diaphoresis, fever and unexpected weight change.  HENT: Positive for congestion, postnasal drip, rhinorrhea, sinus pain, sinus pressure and sneezing. Negative for ear discharge, ear pain, hearing loss, nosebleeds, sore throat and tinnitus.   Eyes: Negative.   Respiratory: Positive for cough. Negative for apnea, choking, chest tightness, shortness of breath, wheezing and stridor.   Gastrointestinal: Negative.   Neurological: Positive for light-headedness. Negative for dizziness and headaches.       Objective:   BP 108/68 (BP Location: Left Arm, Patient Position: Sitting, Cuff Size: Normal)   Pulse 68   Temp 98.5 F (36.9 C) (Oral)   Resp 16   Wt 109 lb (49.4 kg)   BMI 18.71 kg/m   Patient Active Problem List   Diagnosis Date Noted  .  Normocytic anemia 10/14/2016  . Anemia in chronic renal disease 10/10/2016  . Leg swelling   . Intractable episodic headache   . Gastritis   . History of adenomatous polyp of colon 09/07/2016  . Benign neoplasm of cecum   . Rectal polyp   . Abdominal pain 09/05/2016  . Iron deficiency anemia 09/05/2016  . Shoulder pain 05/13/2016  . Chronic kidney disease (CKD), active medical management without dialysis, stage 3 (moderate) ([place]) 02/05/2016  . Chronic headache 12/11/2015  . Multiple thyroid nodules 07/04/2015  . Right thyroid nodule 06/27/2015  . Allergic rhinitis, seasonal 12/03/2014  . Fever blister 12/03/2014  . Herniated lumbar intervertebral disc 12/03/2014  . Recurrent sinus infections 12/03/2014  . Insomnia 11/05/2014  . Acquired scoliosis 06/26/2014  . Intervertebral disc stenosis of neural canal of lumbar region 06/20/2014  . Lumbar facet arthropathy 05/21/2014  . Degenerative spondylolisthesis 03/12/2014  . OP (osteoporosis) 08/10/2013  . Idiopathic peripheral neuropathy 03/24/2009  . Edema 12/27/2007  . Headache, tension-type 10/25/2006  . Essential hypertension 10/24/2006  . Anxiety 05/03/1998  . Esophageal reflux 05/03/1998    Outpatient Encounter Prescriptions as of 03/02/2017  Medication Sig  . alendronate (FOSAMAX) 70 MG tablet Take 1 tablet (70 mg total) by mouth every 7 (seven) days. Take with a full glass of water on an empty stomach. (Patient not taking: Reported on 01/18/2017)  . amLODipine (NORVASC) 2.5 MG tablet Take 2 tablets (5 mg total) by mouth daily.  [name] amoxicillin-clavulanate (AUGMENTIN XR) 1000-62.5 MG 12 hr tablet TAKE 2 TABLETS BY MOUTH TWICE DAILY  .  amoxicillin-clavulanate (AUGMENTIN) 875-125 MG tablet Take 1 tablet by mouth 2 (two) times daily.  [name] aspirin EC 81 MG EC tablet Take 1 tablet (81 mg total) by mouth daily.  [name] buPROPion (WELLBUTRIN) 75 MG tablet TAKE 2 TABLETS BY MOUTH DAILY  . butalbital-acetaminophen-caffeine (FIORICET, ESGIC)  50-325-40 MG tablet TAKE 1 TO 2 TABLETS BY MOUTH EVERY 4-6 HOURS AS NEEDED FOR PAIN. MAX OF 8 PER DAY  . carvedilol (COREG) 25 MG tablet TAKE 1 TABLET(25 MG) BY MOUTH TWICE DAILY WITH A MEAL  . cloNIDine (CATAPRES-TTS-2) 0.2 mg/24hr patch Place 1 patch (0.2 mg total) onto the skin once a week.  . furosemide (LASIX) 20 MG tablet furosemide 20 mg tablet  TK 1 T PO QD PRF SWELLING  . hydrALAZINE (APRESOLINE) 50 MG tablet Take 0.5 tablets (25 mg total) by mouth 3 (three) times daily. Take an extra 1/2 tablet if your SBP>180 or DBP>110  . irbesartan (AVAPRO) 300 MG tablet Take 1 tablet (300 mg total) by mouth daily.  [name] LORazepam (ATIVAN) 1 MG tablet TAKE 1 TABLET BY MOUTH TWICE A DAY AS NEEDED  . meloxicam (MOBIC) 15 MG tablet Mobic 15 mg tablet  Take 1 tablet every day by oral route.  . pantoprazole (PROTONIX) 40 MG tablet Take 1 tablet (40 mg total) by mouth 2 (two) times daily before a meal. (Patient not taking: Reported on 01/18/2017)  . ranitidine (ZANTAC) 150 MG tablet Take 150 mg by mouth 2 (two) times daily.  . valACYclovir (VALTREX) 1000 MG tablet 2 tablets twice a day for 1 day as needed for cold sores (Patient not taking: Reported on 01/18/2017)  . zolpidem (AMBIEN) 10 MG tablet Take 1 tablet (10 mg total) by mouth at bedtime as needed. for sleep   No facility-administered encounter medications on file as of 03/02/2017.     Allergies  Allergen Reactions  . Cephalosporins   . Clarithromycin   . Lunesta  [Eszopiclone]     Heart racing  . Penicillin V     Has patient had a PCN reaction causing immediate rash, facial/tongue/throat swelling, SOB or lightheadedness with hypotension: Yes Has patient had a PCN reaction causing severe rash involving mucus membranes or skin necrosis: No Has patient had a PCN reaction that required hospitalization No Has patient had a PCN reaction occurring within the last 10 years: No If all of the above answers are "NO", then may proceed with Cephalosporin  use.  . Sulfa Antibiotics        Physical Exam  Constitutional: She is oriented to person, place, and time. She appears well-developed and well-nourished. No distress.  HENT:  Right Ear: External ear normal.  Left Ear: External ear normal.  Nose: Right sinus exhibits maxillary sinus tenderness and frontal sinus tenderness. Left sinus exhibits maxillary sinus tenderness and frontal sinus tenderness.  Mouth/Throat: Oropharynx is clear and moist. No oropharyngeal exudate, posterior oropharyngeal edema or posterior oropharyngeal erythema.  Tms opaque bilaterally   Eyes: Conjunctivae are normal. Right eye exhibits no discharge. Left eye exhibits no discharge.  Neck: Neck supple.  Cardiovascular: Normal rate and regular rhythm.   Pulmonary/Chest: Effort normal and breath sounds normal. She has no wheezes.  Lymphadenopathy:    She has no cervical adenopathy.  Neurological: She is alert and oriented to person, place, and time.  Skin: Skin is warm and dry. She is not diaphoretic.  Psychiatric: She has a normal mood and affect. Her behavior is normal.       Assessment & Plan:  1. Acute recurrent maxillary sinusitis  Have reviewed chart and this is in fact the regimen patient usually gets. Will prescribe this, call back if not improving.   - amoxicillin-clavulanate (AUGMENTIN XR) 1000-62.5 MG 12 hr tablet; Take 2 tablets by mouth 2 (two) times daily.  Dispense: 40 tablet; Refill: 0  Return if symptoms worsen or fail to improve.  The entirety of the information documented in the History of Present Illness, Review of Systems and Physical Exam were personally obtained by me. Portions of this information were initially documented by Ashley Royalty, CMA and reviewed by me for thoroughness and accuracy.

## 2017-03-09 ENCOUNTER — Ambulatory Visit: Payer: Medicare Other | Admitting: Family Medicine

## 2017-03-09 NOTE — Progress Notes (Deleted)
Patient: Amy [name] Female    DOB: Sep 16, 1950   66 y.o.   MRN: [id number] Visit Date: 03/09/2017  Today's Provider: Lelon Huh, MD   No chief complaint on file.  Subjective:    HPI  Malignant hypertension From 01/11/2017-Changd oral clonidine to clonidine patch. Increased amlodipine to 2 x 2.5mg  daily. Changed scheduled hydralazine to 25 mg (1/2 x 50) and take the other half as needed when BP>180/110. Labs checked-showing-kidney functions have gotten worse, advised  to cut back to 150mg  irbesartan daily. Also increase water intake.    Allergies  Allergen Reactions  . Cephalosporins   . Clarithromycin   . Lunesta  [Eszopiclone]     Heart racing  . Penicillin V     Has patient had a PCN reaction causing immediate rash, facial/tongue/throat swelling, SOB or lightheadedness with hypotension: Yes Has patient had a PCN reaction causing severe rash involving mucus membranes or skin necrosis: No Has patient had a PCN reaction that required hospitalization No Has patient had a PCN reaction occurring within the last 10 years: No If all of the above answers are "NO", then may proceed with Cephalosporin use.  . Sulfa Antibiotics      Current Outpatient Medications:  .  alendronate (FOSAMAX) 70 MG tablet, Take 1 tablet (70 mg total) by mouth every 7 (seven) days. Take with a full glass of water on an empty stomach. (Patient not taking: Reported on 01/18/2017), Disp: 12 tablet, Rfl: 4 .  amLODipine (NORVASC) 2.5 MG tablet, Take 2 tablets (5 mg total) by mouth daily., Disp: 1 tablet, Rfl: 0 .  amoxicillin-clavulanate (AUGMENTIN XR) 1000-62.5 MG 12 hr tablet, Take 2 tablets by mouth 2 (two) times daily., Disp: 40 tablet, Rfl: 0 .  aspirin EC 81 MG EC tablet, Take 1 tablet (81 mg total) by mouth daily., Disp: , Rfl:  .  buPROPion (WELLBUTRIN) 75 MG tablet, TAKE 2 TABLETS BY MOUTH DAILY, Disp: 180 tablet, Rfl: 2 .  butalbital-acetaminophen-caffeine (FIORICET, ESGIC) 50-325-40 MG  tablet, TAKE 1 TO 2 TABLETS BY MOUTH EVERY 4-6 HOURS AS NEEDED FOR PAIN. MAX OF 8 PER DAY, Disp: 150 tablet, Rfl: 4 .  carvedilol (COREG) 25 MG tablet, TAKE 1 TABLET(25 MG) BY MOUTH TWICE DAILY WITH A MEAL, Disp: 60 tablet, Rfl: 2 .  cloNIDine (CATAPRES-TTS-2) 0.2 mg/24hr patch, Place 1 patch (0.2 mg total) onto the skin once a week., Disp: 4 patch, Rfl: 12 .  furosemide (LASIX) 20 MG tablet, furosemide 20 mg tablet  TK 1 T PO QD PRF SWELLING, Disp: , Rfl:  .  hydrALAZINE (APRESOLINE) 50 MG tablet, Take 0.5 tablets (25 mg total) by mouth 3 (three) times daily. Take an extra 1/2 tablet if your SBP>180 or DBP>110, Disp: 3 tablet, Rfl: 1 .  irbesartan (AVAPRO) 300 MG tablet, Take 1 tablet (300 mg total) by mouth daily., Disp: 30 tablet, Rfl: 1 .  LORazepam (ATIVAN) 1 MG tablet, TAKE 1 TABLET BY MOUTH TWICE A DAY AS NEEDED, Disp: 180 tablet, Rfl: 0 .  meloxicam (MOBIC) 15 MG tablet, Mobic 15 mg tablet  Take 1 tablet every day by oral route., Disp: , Rfl:  .  pantoprazole (PROTONIX) 40 MG tablet, Take 1 tablet (40 mg total) by mouth 2 (two) times daily before a meal. (Patient not taking: Reported on 01/18/2017), Disp: 60 tablet, Rfl: 5 .  ranitidine (ZANTAC) 150 MG tablet, Take 150 mg by mouth 2 (two) times daily., Disp: , Rfl:  .  valACYclovir (VALTREX) 1000 MG tablet, 2 tablets twice a day for 1 day as needed for cold sores (Patient not taking: Reported on 01/18/2017), Disp: 20 tablet, Rfl: 1 .  zolpidem (AMBIEN) 10 MG tablet, Take 1 tablet (10 mg total) by mouth at bedtime as needed. for sleep, Disp: 30 tablet, Rfl: 0  Review of Systems  Constitutional: Negative for appetite change, chills, fatigue and fever.  Respiratory: Negative for chest tightness and shortness of breath.   Cardiovascular: Negative for chest pain and palpitations.  Gastrointestinal: Negative for abdominal pain, nausea and vomiting.  Neurological: Negative for dizziness and weakness.    Social History   Tobacco Use  . Smoking  status: Never Smoker  . Smokeless tobacco: Never Used  Substance Use Topics  . Alcohol use: Yes    Alcohol/week: 0.0 oz    Comment: few glasses a wine/wk.   Objective:   There were no vitals taken for this visit. There were no vitals filed for this visit.   Physical Exam      Assessment & Plan:           Lelon Huh, MD  [facility]

## 2017-03-14 ENCOUNTER — Other Ambulatory Visit: Payer: Self-pay | Admitting: Physician Assistant

## 2017-03-14 DIAGNOSIS — J0101 Acute recurrent maxillary sinusitis: Secondary | ICD-10-CM

## 2017-03-15 ENCOUNTER — Inpatient Hospital Stay: Payer: Medicare Other

## 2017-03-15 ENCOUNTER — Encounter (INDEPENDENT_AMBULATORY_CARE_PROVIDER_SITE_OTHER): Payer: Self-pay

## 2017-03-15 ENCOUNTER — Ambulatory Visit: Payer: Medicare Other | Admitting: Family Medicine

## 2017-03-15 ENCOUNTER — Inpatient Hospital Stay: Payer: Medicare Other | Attending: Oncology | Admitting: *Deleted

## 2017-03-15 DIAGNOSIS — I129 Hypertensive chronic kidney disease with stage 1 through stage 4 chronic kidney disease, or unspecified chronic kidney disease: Secondary | ICD-10-CM | POA: Diagnosis not present

## 2017-03-15 DIAGNOSIS — D631 Anemia in chronic kidney disease: Secondary | ICD-10-CM | POA: Diagnosis not present

## 2017-03-15 DIAGNOSIS — Z86010 Personal history of colonic polyps: Secondary | ICD-10-CM | POA: Insufficient documentation

## 2017-03-15 DIAGNOSIS — N183 Chronic kidney disease, stage 3 (moderate): Secondary | ICD-10-CM | POA: Diagnosis not present

## 2017-03-15 DIAGNOSIS — K219 Gastro-esophageal reflux disease without esophagitis: Secondary | ICD-10-CM | POA: Insufficient documentation

## 2017-03-15 DIAGNOSIS — Z8619 Personal history of other infectious and parasitic diseases: Secondary | ICD-10-CM | POA: Diagnosis not present

## 2017-03-15 DIAGNOSIS — N189 Chronic kidney disease, unspecified: Secondary | ICD-10-CM

## 2017-03-15 DIAGNOSIS — Z7982 Long term (current) use of aspirin: Secondary | ICD-10-CM | POA: Insufficient documentation

## 2017-03-15 DIAGNOSIS — F419 Anxiety disorder, unspecified: Secondary | ICD-10-CM | POA: Insufficient documentation

## 2017-03-15 DIAGNOSIS — Z79899 Other long term (current) drug therapy: Secondary | ICD-10-CM | POA: Diagnosis not present

## 2017-03-15 LAB — CBC WITH DIFFERENTIAL/PLATELET
BASOS PCT: 2 %
Basophils Absolute: 0.1 10*3/uL (ref 0–0.1)
EOS ABS: 0.2 10*3/uL (ref 0–0.7)
EOS PCT: 5 %
HCT: 30.8 % — ABNORMAL LOW (ref 35.0–47.0)
Hemoglobin: 10.1 g/dL — ABNORMAL LOW (ref 12.0–16.0)
Lymphocytes Relative: 22 %
Lymphs Abs: 1 10*3/uL (ref 1.0–3.6)
MCH: 30.5 pg (ref 26.0–34.0)
MCHC: 32.9 g/dL (ref 32.0–36.0)
MCV: 92.8 fL (ref 80.0–100.0)
MONO ABS: 0.3 10*3/uL (ref 0.2–0.9)
MONOS PCT: 7 %
NEUTROS PCT: 64 %
Neutro Abs: 2.7 10*3/uL (ref 1.4–6.5)
PLATELETS: 218 10*3/uL (ref 150–440)
RBC: 3.32 MIL/uL — ABNORMAL LOW (ref 3.80–5.20)
RDW: 14.7 % — AB (ref 11.5–14.5)
WBC: 4.3 10*3/uL (ref 3.6–11.0)

## 2017-03-15 LAB — IRON AND TIBC
IRON: 134 ug/dL (ref 28–170)
Saturation Ratios: 52 % — ABNORMAL HIGH (ref 10.4–31.8)
TIBC: 260 ug/dL (ref 250–450)
UIBC: 125 ug/dL

## 2017-03-15 LAB — FERRITIN: FERRITIN: 63 ng/mL (ref 11–307)

## 2017-03-15 NOTE — Progress Notes (Deleted)
Patient: Amy [name] Female    DOB: May 08, 1950   66 y.o.   MRN: [id number] Visit Date: 03/15/2017  Today's Provider: Lelon Huh, MD   No chief complaint on file.  Subjective:    HPI  Malignant hypertension From 01/11/2017-Changd oral clonidine to clonidine patch. Increased amlodipine to 2 x 2.5mg  daily. Changed scheduled hydralazine to 25 mg (1/2 x 50) and take the other half as needed when BP>180/110. Labs checked-showing-kidney functions have gotten worse, advised  to cut back to 150mg  irbesartan daily. Also increase water intake.    Allergies  Allergen Reactions  . Cephalosporins   . Clarithromycin   . Lunesta  [Eszopiclone]     Heart racing  . Penicillin V     Has patient had a PCN reaction causing immediate rash, facial/tongue/throat swelling, SOB or lightheadedness with hypotension: Yes Has patient had a PCN reaction causing severe rash involving mucus membranes or skin necrosis: No Has patient had a PCN reaction that required hospitalization No Has patient had a PCN reaction occurring within the last 10 years: No If all of the above answers are "NO", then may proceed with Cephalosporin use.  . Sulfa Antibiotics      Current Outpatient Medications:  .  alendronate (FOSAMAX) 70 MG tablet, Take 1 tablet (70 mg total) by mouth every 7 (seven) days. Take with a full glass of water on an empty stomach. (Patient not taking: Reported on 01/18/2017), Disp: 12 tablet, Rfl: 4 .  amLODipine (NORVASC) 2.5 MG tablet, Take 2 tablets (5 mg total) by mouth daily., Disp: 1 tablet, Rfl: 0 .  amoxicillin-clavulanate (AUGMENTIN XR) 1000-62.5 MG 12 hr tablet, Take 2 tablets 2 (two) times daily by mouth. PATIENT NEEDS TO SCHEDULE OFFICE VISIT FOR FOLLOW UP, Disp: 28 tablet, Rfl: 0 .  aspirin EC 81 MG EC tablet, Take 1 tablet (81 mg total) by mouth daily., Disp: , Rfl:  .  buPROPion (WELLBUTRIN) 75 MG tablet, TAKE 2 TABLETS BY MOUTH DAILY, Disp: 180 tablet, Rfl: 2 .   butalbital-acetaminophen-caffeine (FIORICET, ESGIC) 50-325-40 MG tablet, TAKE 1 TO 2 TABLETS BY MOUTH EVERY 4-6 HOURS AS NEEDED FOR PAIN. MAX OF 8 PER DAY, Disp: 150 tablet, Rfl: 4 .  carvedilol (COREG) 25 MG tablet, TAKE 1 TABLET(25 MG) BY MOUTH TWICE DAILY WITH A MEAL, Disp: 60 tablet, Rfl: 2 .  cloNIDine (CATAPRES-TTS-2) 0.2 mg/24hr patch, Place 1 patch (0.2 mg total) onto the skin once a week., Disp: 4 patch, Rfl: 12 .  furosemide (LASIX) 20 MG tablet, furosemide 20 mg tablet  TK 1 T PO QD PRF SWELLING, Disp: , Rfl:  .  hydrALAZINE (APRESOLINE) 50 MG tablet, Take 0.5 tablets (25 mg total) by mouth 3 (three) times daily. Take an extra 1/2 tablet if your SBP>180 or DBP>110, Disp: 3 tablet, Rfl: 1 .  irbesartan (AVAPRO) 300 MG tablet, Take 1 tablet (300 mg total) by mouth daily., Disp: 30 tablet, Rfl: 1 .  LORazepam (ATIVAN) 1 MG tablet, TAKE 1 TABLET BY MOUTH TWICE A DAY AS NEEDED, Disp: 180 tablet, Rfl: 0 .  meloxicam (MOBIC) 15 MG tablet, Mobic 15 mg tablet  Take 1 tablet every day by oral route., Disp: , Rfl:  .  pantoprazole (PROTONIX) 40 MG tablet, Take 1 tablet (40 mg total) by mouth 2 (two) times daily before a meal. (Patient not taking: Reported on 01/18/2017), Disp: 60 tablet, Rfl: 5 .  ranitidine (ZANTAC) 150 MG tablet, Take 150 mg by mouth 2 (  two) times daily., Disp: , Rfl:  .  valACYclovir (VALTREX) 1000 MG tablet, 2 tablets twice a day for 1 day as needed for cold sores (Patient not taking: Reported on 01/18/2017), Disp: 20 tablet, Rfl: 1 .  zolpidem (AMBIEN) 10 MG tablet, Take 1 tablet (10 mg total) by mouth at bedtime as needed. for sleep, Disp: 30 tablet, Rfl: 0  Review of Systems  Constitutional: Negative for appetite change, chills, fatigue and fever.  Respiratory: Negative for chest tightness and shortness of breath.   Cardiovascular: Negative for chest pain and palpitations.  Gastrointestinal: Negative for abdominal pain, nausea and vomiting.  Neurological: Negative for  dizziness and weakness.    Social History   Tobacco Use  . Smoking status: Never Smoker  . Smokeless tobacco: Never Used  Substance Use Topics  . Alcohol use: Yes    Alcohol/week: 0.0 oz    Comment: few glasses a wine/wk.   Objective:   There were no vitals taken for this visit. There were no vitals filed for this visit.   Physical Exam      Assessment & Plan:           Lelon Huh, MD  [facility]

## 2017-03-16 ENCOUNTER — Encounter: Payer: Self-pay | Admitting: Family Medicine

## 2017-03-16 ENCOUNTER — Other Ambulatory Visit: Payer: Self-pay | Admitting: Family Medicine

## 2017-03-16 ENCOUNTER — Ambulatory Visit (INDEPENDENT_AMBULATORY_CARE_PROVIDER_SITE_OTHER): Payer: Medicare Other | Admitting: Family Medicine

## 2017-03-16 VITALS — BP 144/70 | HR 70 | Temp 98.1°F | Resp 16 | Wt 113.0 lb

## 2017-03-16 DIAGNOSIS — L929 Granulomatous disorder of the skin and subcutaneous tissue, unspecified: Secondary | ICD-10-CM

## 2017-03-16 DIAGNOSIS — D649 Anemia, unspecified: Secondary | ICD-10-CM | POA: Diagnosis not present

## 2017-03-16 DIAGNOSIS — J0101 Acute recurrent maxillary sinusitis: Secondary | ICD-10-CM

## 2017-03-16 DIAGNOSIS — N183 Chronic kidney disease, stage 3 unspecified: Secondary | ICD-10-CM

## 2017-03-16 DIAGNOSIS — I10 Essential (primary) hypertension: Secondary | ICD-10-CM

## 2017-03-16 DIAGNOSIS — R51 Headache: Secondary | ICD-10-CM

## 2017-03-16 DIAGNOSIS — G8929 Other chronic pain: Secondary | ICD-10-CM

## 2017-03-16 DIAGNOSIS — G4700 Insomnia, unspecified: Secondary | ICD-10-CM

## 2017-03-16 MED ORDER — CARVEDILOL 12.5 MG PO TABS: ORAL_TABLET | ORAL | 3 refills | Status: DC

## 2017-03-16 MED ORDER — AMOXICILLIN-POT CLAVULANATE ER 1000-62.5 MG PO TB12: 2.0000 | ORAL_TABLET | Freq: Two times a day (BID) | ORAL | 0 refills | Status: DC

## 2017-03-16 MED ORDER — AMLODIPINE BESYLATE 5 MG PO TABS: 5.0000 mg | ORAL_TABLET | Freq: Every day | ORAL | 3 refills | Status: DC

## 2017-03-16 MED ORDER — ZOLPIDEM TARTRATE 10 MG PO TABS: 10.0000 mg | ORAL_TABLET | Freq: Every evening | ORAL | 2 refills | Status: DC | PRN

## 2017-03-16 MED ORDER — IRBESARTAN 150 MG PO TABS: 150.0000 mg | ORAL_TABLET | Freq: Every day | ORAL | 1 refills | Status: DC

## 2017-03-16 MED ORDER — BUTALBITAL-APAP-CAFFEINE 50-325-40 MG PO TABS: ORAL_TABLET | ORAL | 4 refills | Status: DC

## 2017-03-16 MED ORDER — LORAZEPAM 1 MG PO TABS
1.0000 mg | ORAL_TABLET | Freq: Two times a day (BID) | ORAL | 0 refills | Status: DC | PRN
Start: 2017-03-16 — End: 2017-06-21

## 2017-03-16 MED ORDER — IRBESARTAN 150 MG PO TABS: 300.0000 mg | ORAL_TABLET | Freq: Every day | ORAL | 1 refills | Status: DC

## 2017-03-16 NOTE — Progress Notes (Signed)
Patient: Amy [name] Female    DOB: 27-Jun-1950   66 y.o.   MRN: [id number] Visit Date: 03/16/2017  Today's Provider: Lelon Huh, MD   Chief Complaint  Patient presents with  . Hypertension  . Sinusitis   Subjective:    Sinusitis  This is a chronic problem. The current episode started in the past 7 days. There has been no fever. Associated symptoms include congestion, headaches and sinus pressure. Pertinent negatives include no chills or shortness of breath. The treatment provided mild relief.  Patient was seen on 03/02/17 by Fabio Bering and was Rx'd Augmentin to help with her symptoms. Patient reports that she finished the abx about 1 week ago with some improvement in symptoms, but still has sinus pressure and congestion. Denies fever.    Malignant hypertension From 01/11/2017-Changed oral clonidine to clonidine patch, but she states her BP started getting high so she went back to oral clonidine. Increased amlodipine to 2 x 2.5mg  daily, but states she frequently forgets to take second tablet. Changed scheduled hydralazine to 25 mg (1/2 x 50) and take the other half as needed when BP>180/110. She also states she ran out of carvedilol af few months ago. She was previously advised to reduce irbesartan to 1/2 x 300mg  tablet, but she is now having to take a full tablet every day.   She continues to follow up with hematology for normocytic anemia and is receiving procrit injections. Mast recent hgb yesterday was 10.1  Of not is that she had chest CT during hospitalization in May showing ground glass opacities and shoddy lymph nodes. Was treat for presumed infection at that time, but has not had any additional follow up.    BP Readings from Last 3 Encounters:  03/16/17 (!) 144/70  03/02/17 108/68  02/17/17 (!) 170/83      Allergies  Allergen Reactions  . Cephalosporins   . Clarithromycin   . Lunesta  [Eszopiclone]     Heart racing  . Penicillin V     Has patient had a PCN  reaction causing immediate rash, facial/tongue/throat swelling, SOB or lightheadedness with hypotension: Yes Has patient had a PCN reaction causing severe rash involving mucus membranes or skin necrosis: No Has patient had a PCN reaction that required hospitalization No Has patient had a PCN reaction occurring within the last 10 years: No If all of the above answers are "NO", then may proceed with Cephalosporin use.  . Sulfa Antibiotics      Current Outpatient Medications:  .  amLODipine (NORVASC) 2.5 MG tablet, Take 2 tablets (5 mg total) by mouth daily., Disp: 1 tablet, Rfl: 0 .  aspirin EC 81 MG EC tablet, Take 1 tablet (81 mg total) by mouth daily., Disp: , Rfl:  .  buPROPion (WELLBUTRIN) 75 MG tablet, TAKE 2 TABLETS BY MOUTH DAILY, Disp: 180 tablet, Rfl: 2 .  butalbital-acetaminophen-caffeine (FIORICET, ESGIC) 50-325-40 MG tablet, TAKE 1 TO 2 TABLETS BY MOUTH EVERY 4-6 HOURS AS NEEDED FOR PAIN. MAX OF 8 PER DAY, Disp: 150 tablet, Rfl: 4 .  carvedilol (COREG) 25 MG tablet, TAKE 1 TABLET(25 MG) BY MOUTH TWICE DAILY WITH A MEAL, Disp: 60 tablet, Rfl: 2 .  cloNIDine (CATAPRES) 0.2 MG tablet, Take 0.2 mg 2 (two) times daily by mouth., Disp: , Rfl:  .  furosemide (LASIX) 20 MG tablet, furosemide 20 mg tablet  TK 1 T PO QD PRF SWELLING, Disp: , Rfl:  .  hydrALAZINE (APRESOLINE) 50 MG tablet, Take  0.5 tablets (25 mg total) by mouth 3 (three) times daily. Take an extra 1/2 tablet if your SBP>180 or DBP>110, Disp: 3 tablet, Rfl: 1 .  irbesartan (AVAPRO) 300 MG tablet, Take 1 tablet (300 mg total) by mouth daily., Disp: 30 tablet, Rfl: 1 .  LORazepam (ATIVAN) 1 MG tablet, TAKE 1 TABLET BY MOUTH TWICE A DAY AS NEEDED, Disp: 180 tablet, Rfl: 0 .  meloxicam (MOBIC) 15 MG tablet, Mobic 15 mg tablet  Take 1 tablet every day by oral route., Disp: , Rfl:  .  ranitidine (ZANTAC) 150 MG tablet, Take 150 mg by mouth 2 (two) times daily., Disp: , Rfl:  .  zolpidem (AMBIEN) 10 MG tablet, Take 1 tablet (10 mg  total) by mouth at bedtime as needed. for sleep, Disp: 30 tablet, Rfl: 0 .  alendronate (FOSAMAX) 70 MG tablet, Take 1 tablet (70 mg total) by mouth every 7 (seven) days. Take with a full glass of water on an empty stomach. (Patient not taking: Reported on 01/18/2017), Disp: 12 tablet, Rfl: 4 .  amoxicillin-clavulanate (AUGMENTIN XR) 1000-62.5 MG 12 hr tablet, Take 2 tablets 2 (two) times daily by mouth. PATIENT NEEDS TO SCHEDULE OFFICE VISIT FOR FOLLOW UP (Patient not taking: Reported on 03/16/2017), Disp: 28 tablet, Rfl: 0 .  pantoprazole (PROTONIX) 40 MG tablet, Take 1 tablet (40 mg total) by mouth 2 (two) times daily before a meal. (Patient not taking: Reported on 01/18/2017), Disp: 60 tablet, Rfl: 5 .  valACYclovir (VALTREX) 1000 MG tablet, 2 tablets twice a day for 1 day as needed for cold sores (Patient not taking: Reported on 01/18/2017), Disp: 20 tablet, Rfl: 1  Review of Systems  Constitutional: Negative for appetite change, chills, fatigue and fever.  HENT: Positive for congestion and sinus pressure.   Respiratory: Negative for chest tightness and shortness of breath.   Cardiovascular: Negative for chest pain and palpitations.  Gastrointestinal: Negative for abdominal pain, nausea and vomiting.  Neurological: Positive for headaches. Negative for dizziness and weakness.    Social History   Tobacco Use  . Smoking status: Never Smoker  . Smokeless tobacco: Never Used  Substance Use Topics  . Alcohol use: Yes    Alcohol/week: 0.0 oz    Comment: few glasses a wine/wk.   Objective:   BP (!) 144/70 (BP Location: Left Arm, Patient Position: Sitting, Cuff Size: Normal)   Pulse 70   Temp 98.1 F (36.7 C)   Resp 16   Wt 113 lb (51.3 kg)   SpO2 99%   BMI 19.40 kg/m  Vitals:   03/16/17 1404  BP: (!) 144/70  Pulse: 70  Resp: 16  Temp: 98.1 F (36.7 C)  SpO2: 99%  Weight: 113 lb (51.3 kg)     Physical Exam   General Appearance:    Alert, cooperative, no distress  Eyes:     PERRL, conjunctiva/corneas clear, EOM's intact       Lungs:     Clear to auscultation bilaterally, respirations unlabored  Heart:    Regular rate and rhythm  Neurologic:   Awake, alert, oriented x 3. No apparent focal neurological           defect.           Assessment & Plan:     1. Granulomatosis Possible infectious, need to rule out ou immune disorder. Consider repeating Chest CT after reviewin labs.  - ANCA Screen Reflex Titer - Glomerular Membrane Antibodies - Quantiferon tb gold assay  2. Chronic  nonintractable headache, unspecified headache type Need refill butalbital-acetaminophen-caffeine (FIORICET, ESGIC) 50-325-40 MG tablet; TAKE 1 TO 2 TABLETS BY MOUTH EVERY 4-6 HOURS AS NEEDED FOR PAIN. MAX OF 8 PER DAY  Dispense: 150 tablet; Refill: 4  3. Insomnia, unspecified type Needs refill zolpidem (AMBIEN) 10 MG tablet; Take 1 tablet (10 mg total) at bedtime as needed by mouth. for sleep  Dispense: 30 tablet; Refill: 2  4. Essential hypertension Unusually labile blood pressure with episodes of hypertensive urgency over the last 6 months. Will start back on reduced does of carvedilol. Change amlodipine to 5mg  tablets and irbesartan to 150mg  daily - amLODipine (NORVASC) 5 MG tablet; Take 1 tablet (5 mg total) daily by mouth.  Dispense: 30 tablet; Refill: 3 - carvedilol (COREG) 12.5 MG tablet; TAKE 1 TABLET(25 MG) BY MOUTH TWICE DAILY WITH A MEAL  Dispense: 60 tablet; Refill: 3 - irbesartan (AVAPRO) 150 MG tablet; Take 1 tablets (150 mg total) daily by mouth.  Dispense: 30 tablet; Refill: 1  5. Chronic kidney disease (CKD), active medical management without dialysis, stage 3 (moderate) ([facility])  - Renal function panel  6. Acute recurrent maxillary sinusitis  - amoxicillin-clavulanate (AUGMENTIN XR) 1000-62.5 MG 12 hr tablet; Take 2 tablets 2 (two) times daily by mouth.  Dispense: 28 tablet; Refill: 0  7. Normocytic anemia        Lelon Huh, MD  [facility]

## 2017-03-16 NOTE — Patient Instructions (Signed)
   Take 1/2 tablet of 300mg  irbesartan until gone, then take 1 300mg  tablet daily   Start carvedilol 12.5mg  twice a day

## 2017-03-20 LAB — RENAL FUNCTION PANEL
Albumin: 4.4 g/dL (ref 3.6–5.1)
BUN/Creatinine Ratio: 22 (calc) (ref 6–22)
BUN: 47 mg/dL — ABNORMAL HIGH (ref 7–25)
CALCIUM: 9.5 mg/dL (ref 8.6–10.4)
CHLORIDE: 100 mmol/L (ref 98–110)
CO2: 23 mmol/L (ref 20–32)
CREATININE: 2.18 mg/dL — AB (ref 0.50–0.99)
GLUCOSE: 89 mg/dL (ref 65–99)
PHOSPHORUS: 3.9 mg/dL (ref 2.1–4.3)
Potassium: 4.3 mmol/L (ref 3.5–5.3)
Sodium: 137 mmol/L (ref 135–146)

## 2017-03-20 LAB — QUANTIFERON TB GOLD ASSAY (BLOOD)
Mitogen-Nil: >10 IU/mL
QUANTIFERON NIL VALUE: 0.12 [IU]/mL
QUANTIFERON TB AG MINUS NIL: 0.03 [IU]/mL
QUANTIFERON(R)-TB GOLD: NEGATIVE

## 2017-03-20 LAB — GLOMERULAR BASEMENT MEMBRANE ANTIBODIES: GBM Ab: <1 AI

## 2017-03-20 LAB — ANCA SCREEN W REFLEX TITER: ANCA Screen: NEGATIVE

## 2017-03-21 ENCOUNTER — Telehealth: Payer: Self-pay

## 2017-03-21 DIAGNOSIS — R0602 Shortness of breath: Secondary | ICD-10-CM

## 2017-03-21 DIAGNOSIS — J984 Other disorders of lung: Secondary | ICD-10-CM

## 2017-03-21 NOTE — Telephone Encounter (Signed)
-----   Message from [name], MD sent at 03/21/2017 12:26 PM EST ----- Labs are all normal except kidney functions are still low, but stable. No sign of any autoimmune diseases. Need to order chest CT without contrast to re-evaluate densities in lungs seen on CT from May 2018

## 2017-03-21 NOTE — Telephone Encounter (Signed)
Advised patient of results. Chest CT was ordered.

## 2017-03-25 ENCOUNTER — Telehealth: Payer: Self-pay | Admitting: Family Medicine

## 2017-03-25 ENCOUNTER — Ambulatory Visit: Payer: Medicare Other

## 2017-03-25 MED ORDER — DOXYCYCLINE HYCLATE 100 MG PO TABS: 100.0000 mg | ORAL_TABLET | Freq: Two times a day (BID) | ORAL | 0 refills | Status: DC

## 2017-03-25 NOTE — Telephone Encounter (Signed)
Pt stated that she went to pick up the amoxicillin-clavulanate (AUGMENTIN XR) 1000-62.5 MG 12 hr tablet at [facility] and was advised they couldn't fill the full Rx b/c they didn't have enough tablets. When pt went back to pick up the remaining tablets pt was advised they weren't able to get the medication due to temporally stopped making the medication. Pt stated she has contacted multiple drug stores and they are out as well. Pt is requesting another antibiotic sent to [facility]. Please advise. Thanks TNP

## 2017-03-29 ENCOUNTER — Inpatient Hospital Stay: Payer: Medicare Other

## 2017-03-29 ENCOUNTER — Inpatient Hospital Stay: Payer: Medicare Other | Admitting: *Deleted

## 2017-03-29 VITALS — BP 169/81

## 2017-03-29 DIAGNOSIS — D631 Anemia in chronic kidney disease: Secondary | ICD-10-CM | POA: Diagnosis not present

## 2017-03-29 DIAGNOSIS — I129 Hypertensive chronic kidney disease with stage 1 through stage 4 chronic kidney disease, or unspecified chronic kidney disease: Secondary | ICD-10-CM | POA: Diagnosis not present

## 2017-03-29 DIAGNOSIS — F419 Anxiety disorder, unspecified: Secondary | ICD-10-CM | POA: Diagnosis not present

## 2017-03-29 DIAGNOSIS — N189 Chronic kidney disease, unspecified: Principal | ICD-10-CM

## 2017-03-29 DIAGNOSIS — N183 Chronic kidney disease, stage 3 (moderate): Secondary | ICD-10-CM | POA: Diagnosis not present

## 2017-03-29 DIAGNOSIS — D509 Iron deficiency anemia, unspecified: Secondary | ICD-10-CM

## 2017-03-29 DIAGNOSIS — Z79899 Other long term (current) drug therapy: Secondary | ICD-10-CM | POA: Diagnosis not present

## 2017-03-29 DIAGNOSIS — K219 Gastro-esophageal reflux disease without esophagitis: Secondary | ICD-10-CM | POA: Diagnosis not present

## 2017-03-29 LAB — IRON AND TIBC
Iron: 131 ug/dL (ref 28–170)
Saturation Ratios: 59 % — ABNORMAL HIGH (ref 10.4–31.8)
TIBC: 222 ug/dL — ABNORMAL LOW (ref 250–450)
UIBC: 91 ug/dL

## 2017-03-29 LAB — CBC WITH DIFFERENTIAL/PLATELET
BASOS ABS: 0.1 10*3/uL (ref 0–0.1)
Basophils Relative: 1 %
EOS PCT: 6 %
Eosinophils Absolute: 0.3 10*3/uL (ref 0–0.7)
HCT: 27.3 % — ABNORMAL LOW (ref 35.0–47.0)
Hemoglobin: 9.1 g/dL — ABNORMAL LOW (ref 12.0–16.0)
LYMPHS PCT: 19 %
Lymphs Abs: 0.9 10*3/uL — ABNORMAL LOW (ref 1.0–3.6)
MCH: 30.6 pg (ref 26.0–34.0)
MCHC: 33.3 g/dL (ref 32.0–36.0)
MCV: 91.8 fL (ref 80.0–100.0)
MONO ABS: 0.5 10*3/uL (ref 0.2–0.9)
Monocytes Relative: 9 %
Neutro Abs: 3.3 10*3/uL (ref 1.4–6.5)
Neutrophils Relative %: 65 %
PLATELETS: 243 10*3/uL (ref 150–440)
RBC: 2.97 MIL/uL — ABNORMAL LOW (ref 3.80–5.20)
RDW: 14.6 % — AB (ref 11.5–14.5)
WBC: 5 10*3/uL (ref 3.6–11.0)

## 2017-03-29 LAB — FERRITIN: Ferritin: 75 ng/mL (ref 11–307)

## 2017-03-29 MED ORDER — EPOETIN ALFA 40000 UNIT/ML IJ SOLN
40000.0000 [IU] | Freq: Once | INTRAMUSCULAR | Status: AC
  Administered 2017-03-29: 40000 [IU] via SUBCUTANEOUS

## 2017-03-29 NOTE — Progress Notes (Signed)
Patient has elevated BP of 169/81. Spoke with Argentina Donovan, RN and she confirmed with Dr. Grayland Ormond that it was ok to proceed with injection.

## 2017-03-30 ENCOUNTER — Ambulatory Visit: Payer: Medicare Other

## 2017-03-30 ENCOUNTER — Telehealth: Payer: Self-pay | Admitting: Family Medicine

## 2017-03-30 MED ORDER — AZITHROMYCIN 250 MG PO TABS: ORAL_TABLET | ORAL | 0 refills | Status: AC

## 2017-03-30 NOTE — Telephone Encounter (Signed)
Pt states she stating taking the Rx doxycycline (VIBRA-TABS) 100 MG tablet on 03/25/17.  Pt states she is not any better.  Pt is still having chest congestion, cough and yellow mucus from her nose.  Pt is requesting a different Rx to help with this.  [place].  CB#[phone]/MW

## 2017-03-30 NOTE — Telephone Encounter (Signed)
Please advise 

## 2017-04-01 ENCOUNTER — Other Ambulatory Visit: Payer: Self-pay | Admitting: Family Medicine

## 2017-04-01 DIAGNOSIS — G4700 Insomnia, unspecified: Secondary | ICD-10-CM

## 2017-04-05 ENCOUNTER — Ambulatory Visit (INDEPENDENT_AMBULATORY_CARE_PROVIDER_SITE_OTHER): Payer: Medicare Other | Admitting: Family Medicine

## 2017-04-05 ENCOUNTER — Encounter: Payer: Self-pay | Admitting: Family Medicine

## 2017-04-05 VITALS — BP 120/70 | HR 77 | Temp 97.9°F | Resp 16 | Wt 110.0 lb

## 2017-04-05 DIAGNOSIS — R1013 Epigastric pain: Secondary | ICD-10-CM

## 2017-04-05 DIAGNOSIS — I10 Essential (primary) hypertension: Secondary | ICD-10-CM

## 2017-04-05 DIAGNOSIS — J329 Chronic sinusitis, unspecified: Secondary | ICD-10-CM | POA: Diagnosis not present

## 2017-04-05 MED ORDER — AMOXICILLIN 875 MG PO TABS: 875.0000 mg | ORAL_TABLET | Freq: Two times a day (BID) | ORAL | 0 refills | Status: DC

## 2017-04-05 MED ORDER — PANTOPRAZOLE SODIUM 40 MG PO TBEC: 40.0000 mg | DELAYED_RELEASE_TABLET | Freq: Every day | ORAL | 5 refills | Status: DC

## 2017-04-05 MED ORDER — FLUTICASONE PROPIONATE 50 MCG/ACT NA SUSP: 1.0000 | Freq: Every day | NASAL | 5 refills | Status: DC

## 2017-04-05 MED ORDER — AMOXICILLIN-POT CLAVULANATE 875-125 MG PO TABS: 1.0000 | ORAL_TABLET | Freq: Two times a day (BID) | ORAL | 0 refills | Status: DC

## 2017-04-05 NOTE — Progress Notes (Signed)
Patient: Amy [name] Female    DOB: 1950/08/26   66 y.o.   MRN: [id number] Visit Date: 04/05/2017  Today's Provider: Lelon Huh, MD   Chief Complaint  Patient presents with  . Sinusitis  . Hypertension   Subjective:    HPI   Hypertension, follow-up:  BP Readings from Last 3 Encounters:  04/05/17 120/70  03/29/17 (!) 169/81  03/16/17 (!) 144/70    She was last seen for hypertension 3 weeks ago.  BP at that visit was 144/70. Management since that visit includes; started back on reduced dose of carvedilol 12.5 mg and irbesartan to 150 mg qd.She reports good compliance with treatment.  Her BP has been quite labile at home and was very high at cancer center last week. She has started back on scheduled dose of hydralazine which she has taken once today.   She is not having side effects. none She is not exercising. She is adherent to low salt diet.   Outside blood pressures are high, all over the place. She is experiencing none.  Patient denies none.   Cardiovascular risk factors include none.  Use of agents associated with hypertension: none.   ----------------------------------------------------------------   Patient is still having cough, sore throat and stopped-up ears.  No improvement with Azithromycin or doxycycline. Pharmacy was not able to obtain high dose Augmentin. She did take a course of Augmentin 875 without improvement.  She also reports she has been having some epigastric discomfort the last several weeks. Had previously been on omeprazole which was stopped a few years ago due to decline in eGFR, was put on pantoprazole after gastritis on EGD in May, which she apparently took for about a month.     Allergies  Allergen Reactions  . Cephalosporins   . Clarithromycin   . Lunesta  [Eszopiclone]     Heart racing  . Penicillin V     Has patient had a PCN reaction causing immediate rash, facial/tongue/throat swelling, SOB or lightheadedness with  hypotension: Yes Has patient had a PCN reaction causing severe rash involving mucus membranes or skin necrosis: No Has patient had a PCN reaction that required hospitalization No Has patient had a PCN reaction occurring within the last 10 years: No If all of the above answers are "NO", then may proceed with Cephalosporin use.  . Sulfa Antibiotics      Current Outpatient Medications:  .  alendronate (FOSAMAX) 70 MG tablet, Take 1 tablet (70 mg total) by mouth every 7 (seven) days. Take with a full glass of water on an empty stomach., Disp: 12 tablet, Rfl: 4 .  amLODipine (NORVASC) 5 MG tablet, Take 1 tablet (5 mg total) daily by mouth., Disp: 30 tablet, Rfl: 3 .  aspirin EC 81 MG EC tablet, Take 1 tablet (81 mg total) by mouth daily., Disp: , Rfl:  .  buPROPion (WELLBUTRIN) 75 MG tablet, TAKE 2 TABLETS BY MOUTH DAILY, Disp: 180 tablet, Rfl: 2 .  butalbital-acetaminophen-caffeine (FIORICET, ESGIC) 50-325-40 MG tablet, TAKE 1 TO 2 TABLETS BY MOUTH EVERY 4-6 HOURS AS NEEDED FOR PAIN. MAX OF 8 PER DAY, Disp: 150 tablet, Rfl: 4 .  carvedilol (COREG) 12.5 MG tablet, TAKE 1 TABLET(25 MG) BY MOUTH TWICE DAILY WITH A MEAL, Disp: 60 tablet, Rfl: 3 .  cloNIDine (CATAPRES) 0.2 MG tablet, Take 0.2 mg 2 (two) times daily by mouth., Disp: , Rfl:  .  furosemide (LASIX) 20 MG tablet, furosemide 20 mg tablet  TK 1  T PO QD PRF SWELLING, Disp: , Rfl:  .  hydrALAZINE (APRESOLINE) 50 MG tablet, Take 0.5 tablets (25 mg total) by mouth 3 (three) times daily. Take an extra 1/2 tablet if your SBP>180 or DBP>110, Disp: 3 tablet, Rfl: 1 .  irbesartan (AVAPRO) 150 MG tablet, TAKE 2 TABLETS BY MOUTH DAILY (Patient taking differently: TAKE 1 TABLETS BY MOUTH DAILY), Disp: 180 tablet, Rfl: 1 .  LORazepam (ATIVAN) 1 MG tablet, Take 1 tablet (1 mg total) 2 (two) times daily as needed by mouth., Disp: 180 tablet, Rfl: 0 .  meloxicam (MOBIC) 15 MG tablet, Mobic 15 mg tablet  Take 1 tablet every day by oral route., Disp: , Rfl:  .   pantoprazole (PROTONIX) 40 MG tablet, Take 1 tablet (40 mg total) by mouth daily., Disp: 30 tablet, Rfl: 5 .  ranitidine (ZANTAC) 150 MG tablet, Take 150 mg by mouth 2 (two) times daily., Disp: , Rfl:  .  valACYclovir (VALTREX) 1000 MG tablet, 2 tablets twice a day for 1 day as needed for cold sores, Disp: 20 tablet, Rfl: 1 .  zolpidem (AMBIEN) 10 MG tablet, TAKE 1 TABLET BY MOUTH AT BEDTIME AS NEEDED, Disp: 30 tablet, Rfl: 2 .  fluticasone (FLONASE) 50 MCG/ACT nasal spray, Place 1-2 sprays into both nostrils daily., Disp: 16 g, Rfl: 5  Review of Systems  Constitutional: Negative for appetite change, chills, fatigue and fever.  Respiratory: Negative for chest tightness and shortness of breath.   Cardiovascular: Negative for chest pain and palpitations.  Gastrointestinal: Negative for abdominal pain, nausea and vomiting.  Neurological: Negative for dizziness and weakness.    Social History   Tobacco Use  . Smoking status: Never Smoker  . Smokeless tobacco: Never Used  Substance Use Topics  . Alcohol use: Yes    Alcohol/week: 0.0 oz    Comment: few glasses a wine/wk.   Objective:   BP 120/70 (BP Location: Left Arm, Patient Position: Sitting, Cuff Size: Normal)   Pulse 77   Temp 97.9 F (36.6 C) (Oral)   Resp 16   Wt 110 lb (49.9 kg)   SpO2 98%   BMI 18.88 kg/m  Vitals:   04/05/17 1403  BP: 120/70  Pulse: 77  Resp: 16  Temp: 97.9 F (36.6 C)  TempSrc: Oral  SpO2: 98%  Weight: 110 lb (49.9 kg)     Physical Exam  General Appearance:    Alert, cooperative, no distress  HENT:   bilateral TM normal without fluid or infection, neck without nodes, maxillary sinus tender and nasal mucosa pale and congested  Eyes:    PERRL, conjunctiva/corneas clear, EOM's intact       Lungs:     Clear to auscultation bilaterally, respirations unlabored  Heart:    Regular rate and rhythm  Neurologic:   Awake, alert, oriented x 3. No apparent focal neurological           defect.             Assessment & Plan:     1. Recurrent sinus infections Apparently 1074m Augmentin is no longer available. Will take combination Augmentin with amoxicillin.  - fluticasone (FLONASE) 50 MCG/ACT nasal spray; Place 1-2 sprays into both nostrils daily.  Dispense: 16 g; Refill: 5 - amoxicillin-clavulanate (AUGMENTIN) 875-125 MG tablet; Take 1 tablet by mouth 2 (two) times daily for 10 days.  Dispense: 20 tablet; Refill: 0 - amoxicillin (AMOXIL) 875 MG tablet; Take 1 tablet (875 mg total) by mouth 2 (two) times  daily. Take in addition to amoxicillin-clavulanate tablets  Dispense: 20 tablet; Refill: 0  2. Epigastric pain Restart PPI - pantoprazole (PROTONIX) 40 MG tablet; Take 1 tablet (40 mg total) by mouth daily.  Dispense: 30 tablet; Refill: 5  3. Essential hypertension Labile BP. Is tolerating 169m irbesartan, but still requiring multiple medications including BID clonidine and TID hydralazine. If kidney functions stable, then will increase irbesartan and reduce clonidine.   - Renal function panel - Sed Rate (ESR)       DLelon Huh MD  [facility]

## 2017-04-06 ENCOUNTER — Other Ambulatory Visit: Payer: Self-pay | Admitting: Family Medicine

## 2017-04-06 ENCOUNTER — Ambulatory Visit
Admission: RE | Admit: 2017-04-06 | Discharge: 2017-04-06 | Disposition: A | Discharge status: Home / Self Care | Payer: Medicare Other | Source: Ambulatory Visit | Attending: Family Medicine | Admitting: Family Medicine

## 2017-04-06 ENCOUNTER — Telehealth: Payer: Self-pay

## 2017-04-06 DIAGNOSIS — J984 Other disorders of lung: Secondary | ICD-10-CM

## 2017-04-06 DIAGNOSIS — E042 Nontoxic multinodular goiter: Secondary | ICD-10-CM | POA: Insufficient documentation

## 2017-04-06 DIAGNOSIS — R0602 Shortness of breath: Secondary | ICD-10-CM | POA: Diagnosis not present

## 2017-04-06 DIAGNOSIS — I700 Atherosclerosis of aorta: Secondary | ICD-10-CM | POA: Diagnosis not present

## 2017-04-06 DIAGNOSIS — I10 Essential (primary) hypertension: Secondary | ICD-10-CM

## 2017-04-06 DIAGNOSIS — I2510 Atherosclerotic heart disease of native coronary artery without angina pectoris: Secondary | ICD-10-CM | POA: Diagnosis not present

## 2017-04-06 DIAGNOSIS — I517 Cardiomegaly: Secondary | ICD-10-CM | POA: Insufficient documentation

## 2017-04-06 DIAGNOSIS — R918 Other nonspecific abnormal finding of lung field: Secondary | ICD-10-CM | POA: Insufficient documentation

## 2017-04-06 LAB — RENAL FUNCTION PANEL
ALBUMIN MSPROF: 3.7 g/dL (ref 3.6–5.1)
BUN/Creatinine Ratio: 15 (calc) (ref 6–22)
BUN: 29 mg/dL — ABNORMAL HIGH (ref 7–25)
CALCIUM: 9 mg/dL (ref 8.6–10.4)
CO2: 19 mmol/L — AB (ref 20–32)
Chloride: 105 mmol/L (ref 98–110)
Creat: 1.93 mg/dL — ABNORMAL HIGH (ref 0.50–0.99)
Glucose, Bld: 99 mg/dL (ref 65–99)
PHOSPHORUS: 3.5 mg/dL (ref 2.1–4.3)
Potassium: 4 mmol/L (ref 3.5–5.3)
Sodium: 133 mmol/L — ABNORMAL LOW (ref 135–146)

## 2017-04-06 LAB — SEDIMENTATION RATE: SED RATE: 2 mm/h (ref 0–30)

## 2017-04-06 MED ORDER — CLONIDINE HCL 0.1 MG PO TABS: 0.1000 mg | ORAL_TABLET | Freq: Two times a day (BID) | ORAL | 1 refills | Status: DC

## 2017-04-06 MED ORDER — IRBESARTAN 300 MG PO TABS: 300.0000 mg | ORAL_TABLET | Freq: Every day | ORAL | 1 refills | Status: DC

## 2017-04-06 NOTE — Telephone Encounter (Signed)
Advised patient as below. Medication was sent into the pharmacy.  

## 2017-04-06 NOTE — Telephone Encounter (Signed)
-----   Message from [name], MD sent at 04/06/2017  7:58 AM EST ----- Kidney functions continue to improve, go ahead and increase irbesartan to 300mg  one tablet daily, #30, rf x 1 and reduce clonidine to 0.1mg  twice daily, #60, rf x 1. Continue hydralazine and carvedilol. Follow up BP check in 4 weeks.

## 2017-04-08 ENCOUNTER — Other Ambulatory Visit: Payer: Self-pay | Admitting: Family Medicine

## 2017-04-08 ENCOUNTER — Telehealth: Payer: Self-pay | Admitting: Family Medicine

## 2017-04-08 DIAGNOSIS — R590 Localized enlarged lymph nodes: Secondary | ICD-10-CM

## 2017-04-08 DIAGNOSIS — R918 Other nonspecific abnormal finding of lung field: Secondary | ICD-10-CM | POA: Insufficient documentation

## 2017-04-08 HISTORY — DX: Localized enlarged lymph nodes: R59.0

## 2017-04-08 NOTE — Telephone Encounter (Signed)
Patient advised.

## 2017-04-08 NOTE — Telephone Encounter (Signed)
Pt is returning call.  CB#[phone]/MW

## 2017-04-12 ENCOUNTER — Inpatient Hospital Stay: Payer: Medicare Other

## 2017-04-12 ENCOUNTER — Inpatient Hospital Stay: Payer: Medicare Other | Attending: Oncology

## 2017-04-12 DIAGNOSIS — K219 Gastro-esophageal reflux disease without esophagitis: Secondary | ICD-10-CM | POA: Diagnosis not present

## 2017-04-12 DIAGNOSIS — Z86010 Personal history of colonic polyps: Secondary | ICD-10-CM | POA: Insufficient documentation

## 2017-04-12 DIAGNOSIS — Z8619 Personal history of other infectious and parasitic diseases: Secondary | ICD-10-CM | POA: Diagnosis not present

## 2017-04-12 DIAGNOSIS — D649 Anemia, unspecified: Secondary | ICD-10-CM

## 2017-04-12 DIAGNOSIS — D631 Anemia in chronic kidney disease: Secondary | ICD-10-CM | POA: Diagnosis not present

## 2017-04-12 DIAGNOSIS — I129 Hypertensive chronic kidney disease with stage 1 through stage 4 chronic kidney disease, or unspecified chronic kidney disease: Secondary | ICD-10-CM | POA: Insufficient documentation

## 2017-04-12 DIAGNOSIS — Z79899 Other long term (current) drug therapy: Secondary | ICD-10-CM | POA: Diagnosis not present

## 2017-04-12 DIAGNOSIS — Z7982 Long term (current) use of aspirin: Secondary | ICD-10-CM | POA: Diagnosis not present

## 2017-04-12 DIAGNOSIS — F419 Anxiety disorder, unspecified: Secondary | ICD-10-CM | POA: Diagnosis not present

## 2017-04-12 DIAGNOSIS — H43811 Vitreous degeneration, right eye: Secondary | ICD-10-CM | POA: Diagnosis not present

## 2017-04-12 DIAGNOSIS — N183 Chronic kidney disease, stage 3 (moderate): Secondary | ICD-10-CM | POA: Diagnosis not present

## 2017-04-12 LAB — CBC WITH DIFFERENTIAL/PLATELET
Basophils Absolute: 0.1 10*3/uL (ref 0–0.1)
Basophils Relative: 1 %
EOS ABS: 0.4 10*3/uL (ref 0–0.7)
EOS PCT: 9 %
HCT: 28.5 % — ABNORMAL LOW (ref 35.0–47.0)
Hemoglobin: 9.4 g/dL — ABNORMAL LOW (ref 12.0–16.0)
LYMPHS ABS: 0.9 10*3/uL — AB (ref 1.0–3.6)
Lymphocytes Relative: 20 %
MCH: 29.9 pg (ref 26.0–34.0)
MCHC: 32.8 g/dL (ref 32.0–36.0)
MCV: 91.2 fL (ref 80.0–100.0)
MONOS PCT: 7 %
Monocytes Absolute: 0.3 10*3/uL (ref 0.2–0.9)
Neutro Abs: 2.7 10*3/uL (ref 1.4–6.5)
Neutrophils Relative %: 63 %
PLATELETS: 215 10*3/uL (ref 150–440)
RBC: 3.13 MIL/uL — ABNORMAL LOW (ref 3.80–5.20)
RDW: 15.9 % — ABNORMAL HIGH (ref 11.5–14.5)
WBC: 4.4 10*3/uL (ref 3.6–11.0)

## 2017-04-14 ENCOUNTER — Inpatient Hospital Stay: Payer: Medicare Other

## 2017-04-15 ENCOUNTER — Other Ambulatory Visit: Payer: Self-pay | Admitting: Family Medicine

## 2017-04-15 DIAGNOSIS — J329 Chronic sinusitis, unspecified: Secondary | ICD-10-CM

## 2017-04-15 MED ORDER — AMOXICILLIN-POT CLAVULANATE 875-125 MG PO TABS: 1.0000 | ORAL_TABLET | Freq: Two times a day (BID) | ORAL | 0 refills | Status: DC

## 2017-04-18 NOTE — Progress Notes (Signed)
[facility] Consultation      Assessment and Plan:  Lung nodule.  -There is a nodular/scarring area in the right lower lobe, it appears mild on the CT scan from 04/06/17, and is advanced slightly from previous CT in May 2018.  However the numerous other changes seen on the may CAT scan have resolved. -This appears to be low risk, given patient's non-smoking history.  Repeat CT chest in 1 year. -We will check ACE level, rheumatoid factor, Anca.  Chronic bronchitis.  -Changes of chronic bronchitis, with chronic cough, but is otherwise minimally symptomatic. - No intervention required at this time.  Orders Placed This Encounter  Procedures  . CT CHEST WO CONTRAST    Standing Status:   Future    Standing Expiration Date:   06/20/2018    Order Specific Question:   Preferred imaging location?    Answer:   [facility]    Order Specific Question:   Radiology Contrast Protocol - do NOT remove file path    Answer:   [email or web address]  . Angiotensin converting enzyme  . ANCA screen with reflex titer  . Rheumatoid Factor   Return in about 1 year (around 04/19/2018).   Date: 04/19/2017  MRN# [id number] Amy [name] 03/17/1951    Amy [name] is a 66 y.o. old female seen in consultation for chief complaint of:    Chief Complaint  Patient presents with  . Advice Only    area seen on scan: ref by Dr. Caryn Section: cough, prod at times: SOB w/activity:     HPI:   Her breathing today has been feeling well, she has issues with chronic sinusitis. She walked in today from the parking lot and found that her breathing was a bit short but she was ok. She is not limited by her breathing, she does not do exercise. She tells me that she is active, she can do the stairs in her home, she can do Christmas shopping without being limited by breathing.  She has no unintentional weight loss. No hemoptysis. No personal history of cancer, father had  pancreatic cancer.   She has occasional cough, but this is not very common. When she had the CT in May of this year for anemia of uncertain etiology, she is seen in Hematology. She has had an EGD.  She does not snore regularly, she is not sleepy during the day.   Images personally reviewed, CT chest 04/06/17; nonspecific very slight scarring changes in the periphery of the right lower lobe.    Changes have advanced slightly from previous CT chest on 09/26/16, however the  bilateral pleural effusions, and bilateral groundglass opacities have resolved from that film. Overall this most recent CT is looking significantly better.    PMHX:   Past Medical History:  Diagnosis Date  . Anemia    a. 08/2016 Guaiac + stool. EGD w/ gastritis.  . Anxiety   . CKD (chronic kidney disease), stage III ([place])   . Colon polyp    a. 08/2016 Colonoscopy.  . Gastritis    a. 08/2016 EGD: Gastritis-->PPI.  [name] GERD (gastroesophageal reflux disease)   . History of chicken pox   . History of measles as a child   . Hypertension   . Hyponatremia    a. 08/2016 in setting of HCTZ Rx.  . Multiple thyroid nodules    Surgical Hx:  Past Surgical History:  Procedure Laterality Date  . ABDOMINAL HYSTERECTOMY  2003   due to heavy  periods and clotting during menses  . BREAST LUMPECTOMY Left 1990  . CHOLECYSTECTOMY  2010  . COLONOSCOPY WITH PROPOFOL N/A 09/07/2016   Procedure: COLONOSCOPY WITH PROPOFOL;  Surgeon: Lucilla Lame, MD;  [facility]COPY;  Service: Endoscopy;  Laterality: N/A;  . DILATION AND CURETTAGE OF UTERUS  1985  . ESOPHAGOGASTRODUODENOSCOPY (EGD) WITH PROPOFOL N/A 09/07/2016   Procedure: ESOPHAGOGASTRODUODENOSCOPY (EGD) WITH PROPOFOL;  Surgeon: Lucilla Lame, MD;  Location: [facility] ENDOSCOPY;  Service: Endoscopy;  Laterality: N/A;  . GALLBLADDER SURGERY  2012  . KNEE SURGERY     Dr. Tamala Julian  . MRI, Lumbar spine  07/23/2009   Multilevel annular bulge and disc protrusion at L2-L3, L3-L4, L4-L5, and L5-S1  .  TONSILLECTOMY    . TONSILLECTOMY AND ADENOIDECTOMY  1970  . UPPER GI ENDOSCOPY  03/02/2006   H. Pylori negative; Dr. Allen Norris   Family Hx:  Family History  Problem Relation Age of Onset  . Pancreatic cancer Father   . Congestive Heart Failure Maternal Grandmother   . Cancer Paternal Grandfather   . Heart disease Maternal Uncle    Social Hx:   Social History   Tobacco Use  . Smoking status: Never Smoker  . Smokeless tobacco: Never Used  Substance Use Topics  . Alcohol use: Yes    Alcohol/week: 0.0 oz    Comment: few glasses a wine/wk.  . Drug use: No   Medication:    Current Outpatient Medications:  .  amLODipine (NORVASC) 5 MG tablet, Take 1 tablet (5 mg total) daily by mouth., Disp: 30 tablet, Rfl: 3 .  aspirin EC 81 MG EC tablet, Take 1 tablet (81 mg total) by mouth daily., Disp: , Rfl:  .  buPROPion (WELLBUTRIN) 75 MG tablet, TAKE 2 TABLETS BY MOUTH DAILY, Disp: 180 tablet, Rfl: 2 .  butalbital-acetaminophen-caffeine (FIORICET, ESGIC) 50-325-40 MG tablet, TAKE 1 TO 2 TABLETS BY MOUTH EVERY 4-6 HOURS AS NEEDED FOR PAIN. MAX OF 8 PER DAY, Disp: 150 tablet, Rfl: 4 .  carvedilol (COREG) 12.5 MG tablet, TAKE 1 TABLET(25 MG) BY MOUTH TWICE DAILY WITH A MEAL, Disp: 60 tablet, Rfl: 3 .  cloNIDine (CATAPRES) 0.1 MG tablet, Take 1 tablet (0.1 mg total) by mouth 2 (two) times daily., Disp: 60 tablet, Rfl: 1 .  fluticasone (FLONASE) 50 MCG/ACT nasal spray, Place 1-2 sprays into both nostrils daily., Disp: 16 g, Rfl: 5 .  furosemide (LASIX) 20 MG tablet, furosemide 20 mg tablet  TK 1 T PO QD PRF SWELLING, Disp: , Rfl:  .  hydrALAZINE (APRESOLINE) 50 MG tablet, Take 0.5 tablets (25 mg total) by mouth 3 (three) times daily. Take an extra 1/2 tablet if your SBP>180 or DBP>110, Disp: 3 tablet, Rfl: 1 .  irbesartan (AVAPRO) 300 MG tablet, Take 1 tablet (300 mg total) by mouth daily., Disp: 30 tablet, Rfl: 1 .  LORazepam (ATIVAN) 1 MG tablet, Take 1 tablet (1 mg total) 2 (two) times daily as needed  by mouth., Disp: 180 tablet, Rfl: 0 .  pantoprazole (PROTONIX) 40 MG tablet, Take 1 tablet (40 mg total) by mouth daily., Disp: 30 tablet, Rfl: 5 .  ranitidine (ZANTAC) 150 MG tablet, Take 150 mg by mouth 2 (two) times daily., Disp: , Rfl:  .  valACYclovir (VALTREX) 1000 MG tablet, 2 tablets twice a day for 1 day as needed for cold sores, Disp: 20 tablet, Rfl: 1 .  zolpidem (AMBIEN) 10 MG tablet, TAKE 1 TABLET BY MOUTH AT BEDTIME AS NEEDED, Disp: 30 tablet, Rfl: 2  Allergies:  Cephalosporins; Clarithromycin; Lunesta  [eszopiclone]; Penicillin v; and Sulfa antibiotics  Review of Systems: Gen:  Denies  fever, sweats, chills HEENT: Denies blurred vision, double vision. bleeds, sore throat Cvc:  No dizziness, chest pain. Resp:   Denies cough or sputum production, shortness of breath Gi: Denies swallowing difficulty, stomach pain. Gu:  Denies bladder incontinence, burning urine Ext:   No Joint pain, stiffness. Skin: No skin rash,  hives  Endoc:  No polyuria, polydipsia. Psych: No depression, insomnia. Other:  All other systems were reviewed with the patient and were negative other that what is mentioned in the HPI.   Physical Examination:   VS: BP 140/80 (BP Location: Left Arm, Cuff Size: Normal)   Pulse 80   Ht 5\' 4"  (1.626 m)   Wt 110 lb (49.9 kg)   SpO2 97%   BMI 18.88 kg/m   General Appearance: No distress  Neuro:without focal findings,  speech normal,  HEENT: PERRLA, EOM intact.   Pulmonary: normal breath sounds, No wheezing.  CardiovascularNormal S1,S2.  No m/r/g.   Abdomen: Benign, Soft, non-tender. Renal:  No costovertebral tenderness  GU:  No performed at this time. Endoc: No evident thyromegaly, no signs of acromegaly. Skin:   warm, no rashes, no ecchymosis  Extremities: normal, no cyanosis, clubbing.  Other findings:    LABORATORY PANEL:   CBC Recent Labs  Lab 04/12/17 1534  WBC 4.4  HGB 9.4*  HCT 28.5*  PLT 215    ------------------------------------------------------------------------------------------------------------------  Chemistries  No results for input(s): NA, K, CL, CO2, GLUCOSE, BUN, CREATININE, CALCIUM, MG, AST, ALT, ALKPHOS, BILITOT in the last 168 hours.  Invalid input(s): GFRCGP ------------------------------------------------------------------------------------------------------------------  Cardiac Enzymes No results for input(s): TROPONINI in the last 168 hours. ------------------------------------------------------------  RADIOLOGY:  No results found.     Thank  you for the consultation and for allowing [facility] to assist in the care of your patient. Our recommendations are noted above.  Please contact us if we can be of further service.   Marda Stalker, MD.  Board Certified in Internal Medicine, Pulmonary Medicine, [place], and Sleep Medicine.  [facility] Office Number: [phone]  Patricia Pesa, M.D.  Merton Border, M.D  04/19/2017

## 2017-04-19 ENCOUNTER — Ambulatory Visit (INDEPENDENT_AMBULATORY_CARE_PROVIDER_SITE_OTHER): Payer: Medicare Other | Admitting: Internal Medicine

## 2017-04-19 ENCOUNTER — Encounter: Payer: Self-pay | Admitting: Internal Medicine

## 2017-04-19 VITALS — BP 140/80 | HR 80 | Ht 64.0 in | Wt 110.0 lb

## 2017-04-19 DIAGNOSIS — R918 Other nonspecific abnormal finding of lung field: Secondary | ICD-10-CM | POA: Diagnosis not present

## 2017-04-19 DIAGNOSIS — R911 Solitary pulmonary nodule: Secondary | ICD-10-CM

## 2017-04-19 NOTE — Patient Instructions (Signed)
Will check a Ct without contrast in 1 year.   Will check blood work, you can do this without your next scheduled blood draw.

## 2017-04-22 ENCOUNTER — Other Ambulatory Visit: Payer: Self-pay | Admitting: *Deleted

## 2017-04-22 DIAGNOSIS — D509 Iron deficiency anemia, unspecified: Secondary | ICD-10-CM

## 2017-04-26 NOTE — Progress Notes (Deleted)
[place]  [phone] Fax:(336) [phone]  ID: Amy [name] OB: [birth year]  MR#: [id number]  WUX#:[id number]  Patient Care Team: [name], MD as PCP - General (Family Medicine) Thornton Park, MD as Referring Physician (Orthopedic Surgery) Lloyd Huger, MD as Consulting Physician (Oncology) Minna Merritts, MD as Consulting Physician (Cardiology) Murlean Iba, MD as Referring Physician (Nephrology)  CHIEF COMPLAINT:  Anemia in chronic kidney disease, unspecified stage.  INTERVAL HISTORY: Patient returns to clinic today for repeat laboratory work and further evaluation. She has noticed increased weakness and fatigue over the past week, but otherwise has felt well. She has no neurologic complaints. She denies any fevers. She has a good appetite and denies weight loss. She has no chest pain or shortness of breath. She denies any nausea, vomiting, constipation, or diarrhea. She has no urinary complaints. Patient otherwise feels well and offers no further specific complaints.  REVIEW OF SYSTEMS:   Review of Systems  Constitutional: Positive for malaise/fatigue. Negative for fever and weight loss.  Respiratory: Negative.  Negative for cough and shortness of breath.   Cardiovascular: Negative.  Negative for chest pain and leg swelling.  Gastrointestinal: Negative.  Negative for abdominal pain, blood in stool and melena.  Genitourinary: Negative.   Musculoskeletal: Negative.   Skin: Negative.  Negative for rash.  Neurological: Positive for weakness.  Psychiatric/Behavioral: The patient is nervous/anxious.     As per HPI. Otherwise, a complete review of systems is negative.  PAST MEDICAL HISTORY: Past Medical History:  Diagnosis Date  . Anemia    a. 08/2016 Guaiac + stool. EGD w/ gastritis.  . Anxiety   . CKD (chronic kidney disease), stage III ([place])   . Colon polyp    a. 08/2016 Colonoscopy.  . Gastritis    a. 08/2016 EGD:  Gastritis-->PPI.  [name] GERD (gastroesophageal reflux disease)   . History of chicken pox   . History of measles as a child   . Hypertension   . Hyponatremia    a. 08/2016 in setting of HCTZ Rx.  . Multiple thyroid nodules     PAST SURGICAL HISTORY: Past Surgical History:  Procedure Laterality Date  . ABDOMINAL HYSTERECTOMY  2003   due to heavy periods and clotting during menses  . BREAST LUMPECTOMY Left 1990  . CHOLECYSTECTOMY  2010  . COLONOSCOPY WITH PROPOFOL N/A 09/07/2016   Procedure: COLONOSCOPY WITH PROPOFOL;  Surgeon: Lucilla Lame, MD;  Location: [facility] ENDOSCOPY;  Service: Endoscopy;  Laterality: N/A;  . DILATION AND CURETTAGE OF UTERUS  1985  . ESOPHAGOGASTRODUODENOSCOPY (EGD) WITH PROPOFOL N/A 09/07/2016   Procedure: ESOPHAGOGASTRODUODENOSCOPY (EGD) WITH PROPOFOL;  Surgeon: Lucilla Lame, MD;  Location: [facility] ENDOSCOPY;  Service: Endoscopy;  Laterality: N/A;  . GALLBLADDER SURGERY  2012  . KNEE SURGERY     Dr. Tamala Julian  . MRI, Lumbar spine  07/23/2009   Multilevel annular bulge and disc protrusion at L2-L3, L3-L4, L4-L5, and L5-S1  . TONSILLECTOMY    . TONSILLECTOMY AND ADENOIDECTOMY  1970  . UPPER GI ENDOSCOPY  03/02/2006   H. Pylori negative; Dr. Allen Norris    FAMILY HISTORY: Family History  Problem Relation Age of Onset  . Pancreatic cancer Father   . Congestive Heart Failure Maternal Grandmother   . Cancer Paternal Grandfather   . Heart disease Maternal Uncle     ADVANCED DIRECTIVES (Y/N):  N  HEALTH MAINTENANCE: Social History   Tobacco Use  . Smoking status: Never Smoker  . Smokeless tobacco: Never Used  Substance Use Topics  . Alcohol use: Yes    Alcohol/week: 0.0 oz    Comment: few glasses a wine/wk.  . Drug use: No     Colonoscopy:  PAP:  Bone density:  Lipid panel:  Allergies  Allergen Reactions  . Cephalosporins   . Clarithromycin   . Lunesta  [Eszopiclone]     Heart racing  . Penicillin V     Has patient had a PCN reaction causing immediate rash,  facial/tongue/throat swelling, SOB or lightheadedness with hypotension: Yes Has patient had a PCN reaction causing severe rash involving mucus membranes or skin necrosis: No Has patient had a PCN reaction that required hospitalization No Has patient had a PCN reaction occurring within the last 10 years: No If all of the above answers are "NO", then may proceed with Cephalosporin use.  . Sulfa Antibiotics     Current Outpatient Medications  Medication Sig Dispense Refill  . amLODipine (NORVASC) 5 MG tablet Take 1 tablet (5 mg total) daily by mouth. 30 tablet 3  . aspirin EC 81 MG EC tablet Take 1 tablet (81 mg total) by mouth daily.    [name] buPROPion (WELLBUTRIN) 75 MG tablet TAKE 2 TABLETS BY MOUTH DAILY 180 tablet 2  . butalbital-acetaminophen-caffeine (FIORICET, ESGIC) 50-325-40 MG tablet TAKE 1 TO 2 TABLETS BY MOUTH EVERY 4-6 HOURS AS NEEDED FOR PAIN. MAX OF 8 PER DAY 150 tablet 4  . carvedilol (COREG) 12.5 MG tablet TAKE 1 TABLET(25 MG) BY MOUTH TWICE DAILY WITH A MEAL 60 tablet 3  . cloNIDine (CATAPRES) 0.1 MG tablet Take 1 tablet (0.1 mg total) by mouth 2 (two) times daily. 60 tablet 1  . fluticasone (FLONASE) 50 MCG/ACT nasal spray Place 1-2 sprays into both nostrils daily. 16 g 5  . furosemide (LASIX) 20 MG tablet furosemide 20 mg tablet  TK 1 T PO QD PRF SWELLING    . hydrALAZINE (APRESOLINE) 50 MG tablet Take 0.5 tablets (25 mg total) by mouth 3 (three) times daily. Take an extra 1/2 tablet if your SBP>180 or DBP>110 3 tablet 1  . irbesartan (AVAPRO) 300 MG tablet Take 1 tablet (300 mg total) by mouth daily. 30 tablet 1  . LORazepam (ATIVAN) 1 MG tablet Take 1 tablet (1 mg total) 2 (two) times daily as needed by mouth. 180 tablet 0  . pantoprazole (PROTONIX) 40 MG tablet Take 1 tablet (40 mg total) by mouth daily. 30 tablet 5  . ranitidine (ZANTAC) 150 MG tablet Take 150 mg by mouth 2 (two) times daily.    . valACYclovir (VALTREX) 1000 MG tablet 2 tablets twice a day for 1 day as  needed for cold sores 20 tablet 1  . zolpidem (AMBIEN) 10 MG tablet TAKE 1 TABLET BY MOUTH AT BEDTIME AS NEEDED 30 tablet 2   No current facility-administered medications for this visit.     OBJECTIVE: There were no vitals filed for this visit.   There is no height or weight on file to calculate BMI.    ECOG FS:0 - Asymptomatic  General: Well-developed, well-nourished, no acute distress. Eyes: Pink conjunctiva, anicteric sclera. Lungs: Clear to auscultation bilaterally. Heart: Regular rate and rhythm. No rubs, murmurs, or gallops. Abdomen: Soft, nontender, nondistended. No organomegaly noted, normoactive bowel sounds. Musculoskeletal: No edema, cyanosis, or clubbing. Neuro: Alert, answering all questions appropriately. Cranial nerves grossly intact. Skin: No rashes or petechiae noted. Psych: Normal affect.   LAB RESULTS:  Lab Results  Component Value Date   NA 133 (L)  04/05/2017   K 4.0 04/05/2017   CL 105 04/05/2017   CO2 19 (L) 04/05/2017   GLUCOSE 99 04/05/2017   BUN 29 (H) 04/05/2017   CREATININE 1.93 (H) 04/05/2017   CALCIUM 9.0 04/05/2017   PROT 6.3 (L) 12/28/2016   ALBUMIN 4.3 12/28/2016   AST 19 12/28/2016   ALT 13 (L) 12/28/2016   ALKPHOS 117 12/28/2016   BILITOT 0.5 12/28/2016   GFRNONAA 25 (L) 12/28/2016   GFRAA 29 (L) 12/28/2016    Lab Results  Component Value Date   WBC 4.4 04/12/2017   NEUTROABS 2.7 04/12/2017   HGB 9.4 (L) 04/12/2017   HCT 28.5 (L) 04/12/2017   MCV 91.2 04/12/2017   PLT 215 04/12/2017   Lab Results  Component Value Date   IRON 131 03/29/2017   TIBC 222 (L) 03/29/2017   IRONPCTSAT 59 (H) 03/29/2017   Lab Results  Component Value Date   FERRITIN 75 03/29/2017     STUDIES: Ct Chest Wo Contrast  Result Date: 04/06/2017 CLINICAL DATA:  Shortness of breath. EXAM: CT CHEST WITHOUT CONTRAST TECHNIQUE: Multidetector CT imaging of the chest was performed following the standard protocol without IV contrast. COMPARISON:  Chest  radiograph 12/28/2016 FINDINGS: Cardiovascular: Mildly enlarged heart. No pericardial effusion. Calcific atherosclerotic disease of the coronary arteries and less so the aorta. Mediastinum/Nodes: No enlarged mediastinal or axillary lymph nodes. The trachea and esophagus demonstrate no significant findings. Hypoattenuated nodules in the isthmus and right lobe of the thyroid gland, the larger measuring 1.5 cm. Lungs/Pleura: Hyperinflation. Mild pleuroparenchymal scarring in the apices. No pulmonary consolidation, pleural effusion or pneumothorax. Nonspecific ground-glass opacities in the anterior portion of the right lower lobe and right middle lobe. Upper Abdomen: Mesenteric stranding of the upper abdomen. Shotty mesenteric and retroperitoneal lymph nodes. Musculoskeletal: Multilevel osteoarthritic changes of the thoracic spine. IMPRESSION: Mildly enlarged heart. Calcific atherosclerotic disease of the coronary arteries. Mild hyperinflation of the lungs without focal consolidation. Thyroid nodules of up to 1.5 cm. Further evaluation with ultrasound of the neck may be considered. Mesenteric stranding of the upper abdomen and shotty mesenteric and retroperitoneal lymph nodes, partially visualized. Aortic Atherosclerosis (ICD10-I70.0). Electronically Signed   By: Fidela Salisbury M.D.   On: 04/06/2017 20:13    ASSESSMENT: Anemia in chronic kidney disease, unspecified stage.  PLAN:    1. Anemia in chronic kidney disease, unspecified stage: Patient's iron stores are within normal limits. She had a normal colonoscopy and EGD on Sep 07, 2016. CT of the abdomen and pelvis was essentially within normal limits. Patient has some mild chronic renal insufficiency with an inappropriately low erythropoietin level that is likely contributing. Her reticulocyte count is also inappropriately low for this level of anemia. Remainder of her blood work was either negative or within normal limits. Proceed with 40,000 units  subcutaneous Procrit today. Clinic every 2 weeks for laboratory work and Procrit if her hemoglobin falls below 10.0. Patient will then return to clinic in 3 months for further evaluation.  2. Chronic renal insufficiency: Consider referral to nephrology for further evaluation.  Approximately 30 minutes was spent in discussion of which greater than 50% was consultation.  Patient expressed understanding and was in agreement with this plan. She also understands that She can call clinic at any time with any questions, concerns, or complaints.   Lloyd Huger, MD   04/26/2017 11:02 PM

## 2017-04-27 ENCOUNTER — Inpatient Hospital Stay: Payer: Medicare Other

## 2017-04-27 ENCOUNTER — Inpatient Hospital Stay: Payer: Medicare Other | Admitting: Oncology

## 2017-05-02 NOTE — Progress Notes (Deleted)
[place]  Telephone:(336) [phone] Fax:(336) [phone]  ID: Amy [name] OB: November 17, 1950  MR#: [id number]  FIE#:[id number]  Patient Care Team: [name], MD as PCP - General (Family Medicine) Thornton Park, MD as Referring Physician (Orthopedic Surgery) Lloyd Huger, MD as Consulting Physician (Oncology) Minna Merritts, MD as Consulting Physician (Cardiology) Murlean Iba, MD as Referring Physician (Nephrology)  CHIEF COMPLAINT:  Anemia in chronic kidney disease, unspecified stage.  INTERVAL HISTORY: Patient returns to clinic today for repeat laboratory work and further evaluation. She has noticed increased weakness and fatigue over the past week, but otherwise has felt well. She has no neurologic complaints. She denies any fevers. She has a good appetite and denies weight loss. She has no chest pain or shortness of breath. She denies any nausea, vomiting, constipation, or diarrhea. She has no urinary complaints. Patient otherwise feels well and offers no further specific complaints.  REVIEW OF SYSTEMS:   Review of Systems  Constitutional: Positive for malaise/fatigue. Negative for fever and weight loss.  Respiratory: Negative.  Negative for cough and shortness of breath.   Cardiovascular: Negative.  Negative for chest pain and leg swelling.  Gastrointestinal: Negative.  Negative for abdominal pain, blood in stool and melena.  Genitourinary: Negative.   Musculoskeletal: Negative.   Skin: Negative.  Negative for rash.  Neurological: Positive for weakness.  Psychiatric/Behavioral: The patient is nervous/anxious.     As per HPI. Otherwise, a complete review of systems is negative.  PAST MEDICAL HISTORY: Past Medical History:  Diagnosis Date  . Anemia    a. 08/2016 Guaiac + stool. EGD w/ gastritis.  . Anxiety   . CKD (chronic kidney disease), stage III ([place])   . Colon polyp    a. 08/2016 Colonoscopy.  . Gastritis    a. 08/2016 EGD:  Gastritis-->PPI.  [name] GERD (gastroesophageal reflux disease)   . History of chicken pox   . History of measles as a child   . Hypertension   . Hyponatremia    a. 08/2016 in setting of HCTZ Rx.  . Multiple thyroid nodules     PAST SURGICAL HISTORY: Past Surgical History:  Procedure Laterality Date  . ABDOMINAL HYSTERECTOMY  2003   due to heavy periods and clotting during menses  . BREAST LUMPECTOMY Left 1990  . CHOLECYSTECTOMY  2010  . COLONOSCOPY WITH PROPOFOL N/A 09/07/2016   Procedure: COLONOSCOPY WITH PROPOFOL;  Surgeon: Lucilla Lame, MD;  Location: [facility] ENDOSCOPY;  Service: Endoscopy;  Laterality: N/A;  . DILATION AND CURETTAGE OF UTERUS  1985  . ESOPHAGOGASTRODUODENOSCOPY (EGD) WITH PROPOFOL N/A 09/07/2016   Procedure: ESOPHAGOGASTRODUODENOSCOPY (EGD) WITH PROPOFOL;  Surgeon: Lucilla Lame, MD;  Location: [facility] ENDOSCOPY;  Service: Endoscopy;  Laterality: N/A;  . GALLBLADDER SURGERY  2012  . KNEE SURGERY     Dr. Tamala Julian  . MRI, Lumbar spine  07/23/2009   Multilevel annular bulge and disc protrusion at L2-L3, L3-L4, L4-L5, and L5-S1  . TONSILLECTOMY    . TONSILLECTOMY AND ADENOIDECTOMY  1970  . UPPER GI ENDOSCOPY  03/02/2006   H. Pylori negative; Dr. Allen Norris    FAMILY HISTORY: Family History  Problem Relation Age of Onset  . Pancreatic cancer Father   . Congestive Heart Failure Maternal Grandmother   . Cancer Paternal Grandfather   . Heart disease Maternal Uncle     ADVANCED DIRECTIVES (Y/N):  N  HEALTH MAINTENANCE: Social History   Tobacco Use  . Smoking status: Never Smoker  . Smokeless tobacco: Never Used  Substance Use Topics  . Alcohol use: Yes    Alcohol/week: 0.0 oz    Comment: few glasses a wine/wk.  . Drug use: No     Colonoscopy:  PAP:  Bone density:  Lipid panel:  Allergies  Allergen Reactions  . Cephalosporins   . Clarithromycin   . Lunesta  [Eszopiclone]     Heart racing  . Penicillin V     Has patient had a PCN reaction causing immediate rash,  facial/tongue/throat swelling, SOB or lightheadedness with hypotension: Yes Has patient had a PCN reaction causing severe rash involving mucus membranes or skin necrosis: No Has patient had a PCN reaction that required hospitalization No Has patient had a PCN reaction occurring within the last 10 years: No If all of the above answers are "NO", then may proceed with Cephalosporin use.  . Sulfa Antibiotics     Current Outpatient Medications  Medication Sig Dispense Refill  . amLODipine (NORVASC) 5 MG tablet Take 1 tablet (5 mg total) daily by mouth. 30 tablet 3  . aspirin EC 81 MG EC tablet Take 1 tablet (81 mg total) by mouth daily.    [name] buPROPion (WELLBUTRIN) 75 MG tablet TAKE 2 TABLETS BY MOUTH DAILY 180 tablet 2  . butalbital-acetaminophen-caffeine (FIORICET, ESGIC) 50-325-40 MG tablet TAKE 1 TO 2 TABLETS BY MOUTH EVERY 4-6 HOURS AS NEEDED FOR PAIN. MAX OF 8 PER DAY 150 tablet 4  . carvedilol (COREG) 12.5 MG tablet TAKE 1 TABLET(25 MG) BY MOUTH TWICE DAILY WITH A MEAL 60 tablet 3  . cloNIDine (CATAPRES) 0.1 MG tablet Take 1 tablet (0.1 mg total) by mouth 2 (two) times daily. 60 tablet 1  . fluticasone (FLONASE) 50 MCG/ACT nasal spray Place 1-2 sprays into both nostrils daily. 16 g 5  . furosemide (LASIX) 20 MG tablet furosemide 20 mg tablet  TK 1 T PO QD PRF SWELLING    . hydrALAZINE (APRESOLINE) 50 MG tablet Take 0.5 tablets (25 mg total) by mouth 3 (three) times daily. Take an extra 1/2 tablet if your SBP>180 or DBP>110 3 tablet 1  . irbesartan (AVAPRO) 300 MG tablet Take 1 tablet (300 mg total) by mouth daily. 30 tablet 1  . LORazepam (ATIVAN) 1 MG tablet Take 1 tablet (1 mg total) 2 (two) times daily as needed by mouth. 180 tablet 0  . pantoprazole (PROTONIX) 40 MG tablet Take 1 tablet (40 mg total) by mouth daily. 30 tablet 5  . ranitidine (ZANTAC) 150 MG tablet Take 150 mg by mouth 2 (two) times daily.    . valACYclovir (VALTREX) 1000 MG tablet 2 tablets twice a day for 1 day as  needed for cold sores 20 tablet 1  . zolpidem (AMBIEN) 10 MG tablet TAKE 1 TABLET BY MOUTH AT BEDTIME AS NEEDED 30 tablet 2   No current facility-administered medications for this visit.     OBJECTIVE: There were no vitals filed for this visit.   There is no height or weight on file to calculate BMI.    ECOG FS:0 - Asymptomatic  General: Well-developed, well-nourished, no acute distress. Eyes: Pink conjunctiva, anicteric sclera. Lungs: Clear to auscultation bilaterally. Heart: Regular rate and rhythm. No rubs, murmurs, or gallops. Abdomen: Soft, nontender, nondistended. No organomegaly noted, normoactive bowel sounds. Musculoskeletal: No edema, cyanosis, or clubbing. Neuro: Alert, answering all questions appropriately. Cranial nerves grossly intact. Skin: No rashes or petechiae noted. Psych: Normal affect.   LAB RESULTS:  Lab Results  Component Value Date   NA 133 (L)  04/05/2017   K 4.0 04/05/2017   CL 105 04/05/2017   CO2 19 (L) 04/05/2017   GLUCOSE 99 04/05/2017   BUN 29 (H) 04/05/2017   CREATININE 1.93 (H) 04/05/2017   CALCIUM 9.0 04/05/2017   PROT 6.3 (L) 12/28/2016   ALBUMIN 4.3 12/28/2016   AST 19 12/28/2016   ALT 13 (L) 12/28/2016   ALKPHOS 117 12/28/2016   BILITOT 0.5 12/28/2016   GFRNONAA 25 (L) 12/28/2016   GFRAA 29 (L) 12/28/2016    Lab Results  Component Value Date   WBC 4.4 04/12/2017   NEUTROABS 2.7 04/12/2017   HGB 9.4 (L) 04/12/2017   HCT 28.5 (L) 04/12/2017   MCV 91.2 04/12/2017   PLT 215 04/12/2017   Lab Results  Component Value Date   IRON 131 03/29/2017   TIBC 222 (L) 03/29/2017   IRONPCTSAT 59 (H) 03/29/2017   Lab Results  Component Value Date   FERRITIN 75 03/29/2017     STUDIES: Ct Chest Wo Contrast  Result Date: 04/06/2017 CLINICAL DATA:  Shortness of breath. EXAM: CT CHEST WITHOUT CONTRAST TECHNIQUE: Multidetector CT imaging of the chest was performed following the standard protocol without IV contrast. COMPARISON:  Chest  radiograph 12/28/2016 FINDINGS: Cardiovascular: Mildly enlarged heart. No pericardial effusion. Calcific atherosclerotic disease of the coronary arteries and less so the aorta. Mediastinum/Nodes: No enlarged mediastinal or axillary lymph nodes. The trachea and esophagus demonstrate no significant findings. Hypoattenuated nodules in the isthmus and right lobe of the thyroid gland, the larger measuring 1.5 cm. Lungs/Pleura: Hyperinflation. Mild pleuroparenchymal scarring in the apices. No pulmonary consolidation, pleural effusion or pneumothorax. Nonspecific ground-glass opacities in the anterior portion of the right lower lobe and right middle lobe. Upper Abdomen: Mesenteric stranding of the upper abdomen. Shotty mesenteric and retroperitoneal lymph nodes. Musculoskeletal: Multilevel osteoarthritic changes of the thoracic spine. IMPRESSION: Mildly enlarged heart. Calcific atherosclerotic disease of the coronary arteries. Mild hyperinflation of the lungs without focal consolidation. Thyroid nodules of up to 1.5 cm. Further evaluation with ultrasound of the neck may be considered. Mesenteric stranding of the upper abdomen and shotty mesenteric and retroperitoneal lymph nodes, partially visualized. Aortic Atherosclerosis (ICD10-I70.0). Electronically Signed   By: Fidela Salisbury M.D.   On: 04/06/2017 20:13    ASSESSMENT: Anemia in chronic kidney disease, unspecified stage.  PLAN:    1. Anemia in chronic kidney disease, unspecified stage: Patient's iron stores are within normal limits. She had a normal colonoscopy and EGD on Sep 07, 2016. CT of the abdomen and pelvis was essentially within normal limits. Patient has some mild chronic renal insufficiency with an inappropriately low erythropoietin level that is likely contributing. Her reticulocyte count is also inappropriately low for this level of anemia. Remainder of her blood work was either negative or within normal limits. Proceed with 40,000 units  subcutaneous Procrit today. Clinic every 2 weeks for laboratory work and Procrit if her hemoglobin falls below 10.0. Patient will then return to clinic in 3 months for further evaluation.  2. Chronic renal insufficiency: Consider referral to nephrology for further evaluation.  Approximately 30 minutes was spent in discussion of which greater than 50% was consultation.  Patient expressed understanding and was in agreement with this plan. She also understands that She can call clinic at any time with any questions, concerns, or complaints.   Lloyd Huger, MD   05/02/2017 4:09 PM

## 2017-05-04 ENCOUNTER — Inpatient Hospital Stay: Payer: Medicare Other | Admitting: Oncology

## 2017-05-04 ENCOUNTER — Inpatient Hospital Stay: Payer: Medicare Other

## 2017-05-06 ENCOUNTER — Inpatient Hospital Stay: Payer: Medicare Other

## 2017-05-08 NOTE — Progress Notes (Signed)
[place]  [phone] [phone]  ID: [name] OB: 1950/05/23  MR#: [id number]  QZR#:[id number]  Patient Care Team: [name], MD as PCP - General (Family Medicine) Thornton Park, MD as Referring Physician (Orthopedic Surgery) Lloyd Huger, MD as Consulting Physician (Oncology) Minna Merritts, MD as Consulting Physician (Cardiology) Murlean Iba, MD as Referring Physician (Nephrology)  CHIEF COMPLAINT:  Anemia in chronic kidney disease, unspecified stage.  INTERVAL HISTORY: Patient returns to clinic today for repeat laboratory work, further evaluation, and continuation of Procrit.  She continues to have persistent weakness and fatigue, but otherwise feels well.  She has no neurologic complaints. She denies any fevers. She has a good appetite and denies weight loss. She has no chest pain or shortness of breath. She denies any nausea, vomiting, constipation, or diarrhea.  She denies any melena or hematochezia.  She has no urinary complaints. Patient otherwise feels well and offers no further specific complaints.  REVIEW OF SYSTEMS:   Review of Systems  Constitutional: Positive for malaise/fatigue. Negative for fever and weight loss.  Respiratory: Negative.  Negative for cough and shortness of breath.   Cardiovascular: Negative.  Negative for chest pain and leg swelling.  Gastrointestinal: Negative.  Negative for abdominal pain, blood in stool and melena.  Genitourinary: Negative.   Musculoskeletal: Negative.   Skin: Negative.  Negative for rash.  Neurological: Positive for weakness.  Psychiatric/Behavioral: The patient is nervous/anxious.     As per HPI. Otherwise, a complete review of systems is negative.  PAST MEDICAL HISTORY: Past Medical History:  Diagnosis Date  . Anemia    a. 08/2016 Guaiac + stool. EGD w/ gastritis.  . Anxiety   . CKD (chronic kidney disease), stage III ([place])   . Colon polyp    a. 08/2016  Colonoscopy.  . Gastritis    a. 08/2016 EGD: Gastritis-->PPI.  [name] GERD (gastroesophageal reflux disease)   . History of chicken pox   . History of measles as a child   . Hypertension   . Hyponatremia    a. 08/2016 in setting of HCTZ Rx.  . Multiple thyroid nodules     PAST SURGICAL HISTORY: Past Surgical History:  Procedure Laterality Date  . ABDOMINAL HYSTERECTOMY  2003   due to heavy periods and clotting during menses  . BREAST LUMPECTOMY Left 1990  . CHOLECYSTECTOMY  2010  . COLONOSCOPY WITH PROPOFOL N/A 09/07/2016   Procedure: COLONOSCOPY WITH PROPOFOL;  Surgeon: Lucilla Lame, MD;  Location[facility] ENDOSCOPY;  Service: Endoscopy;  Laterality: N/A;  . DILATION AND CURETTAGE OF UTERUS  1985  . ESOPHAGOGASTRODUODENOSCOPY (EGD) WITH PROPOFOL N/A 09/07/2016   Procedure: ESOPHAGOGASTRODUODENOSCOPY (EGD) WITH PROPOFOL;  Surgeon: Lucilla Lame, MD;  Location[facility] ENDOSCOPY;  Service: Endoscopy;  Laterality: N/A;  . GALLBLADDER SURGERY  2012  . KNEE SURGERY     Dr. Tamala Julian  . MRI, Lumbar spine  07/23/2009   Multilevel annular bulge and disc protrusion at L2-L3, L3-L4, L4-L5, and L5-S1  . TONSILLECTOMY    . TONSILLECTOMY AND ADENOIDECTOMY  1970  . UPPER GI ENDOSCOPY  03/02/2006   H. Pylori negative; Dr. Allen Norris    FAMILY HISTORY: Family History  Problem Relation Age of Onset  . Pancreatic cancer Father   . Congestive Heart Failure Maternal Grandmother   . Cancer Paternal Grandfather   . Heart disease Maternal Uncle     ADVANCED DIRECTIVES (Y/N):  N  HEALTH MAINTENANCE: Social History   Tobacco Use  . Smoking status:  Never Smoker  . Smokeless tobacco: Never Used  Substance Use Topics  . Alcohol use: Yes    Alcohol/week: 0.0 oz    Comment: few glasses a wine/wk.  . Drug use: No     Colonoscopy:  PAP:  Bone density:  Lipid panel:  Allergies  Allergen Reactions  . Cephalosporins   . Clarithromycin   . Lunesta  [Eszopiclone]     Heart racing  . Penicillin V     Has  patient had a PCN reaction causing immediate rash, facial/tongue/throat swelling, SOB or lightheadedness with hypotension: Yes Has patient had a PCN reaction causing severe rash involving mucus membranes or skin necrosis: No Has patient had a PCN reaction that required hospitalization No Has patient had a PCN reaction occurring within the last 10 years: No If all of the above answers are "NO", then may proceed with Cephalosporin use.  . Sulfa Antibiotics     Current Outpatient Medications  Medication Sig Dispense Refill  . aspirin EC 81 MG EC tablet Take 1 tablet (81 mg total) by mouth daily.    [name] buPROPion (WELLBUTRIN) 75 MG tablet TAKE 2 TABLETS BY MOUTH DAILY 180 tablet 2  . butalbital-acetaminophen-caffeine (FIORICET, ESGIC) 50-325-40 MG tablet TAKE 1 TO 2 TABLETS BY MOUTH EVERY 4-6 HOURS AS NEEDED FOR PAIN. MAX OF 8 PER DAY 150 tablet 4  . carvedilol (COREG) 12.5 MG tablet TAKE 1 TABLET(25 MG) BY MOUTH TWICE DAILY WITH A MEAL 60 tablet 3  . cloNIDine (CATAPRES) 0.1 MG tablet Take 1 tablet (0.1 mg total) by mouth 2 (two) times daily. 60 tablet 1  . fluticasone (FLONASE) 50 MCG/ACT nasal spray Place 1-2 sprays into both nostrils daily. 16 g 5  . furosemide (LASIX) 20 MG tablet furosemide 20 mg tablet  TK 1 T PO QD PRF SWELLING    . hydrALAZINE (APRESOLINE) 50 MG tablet Take 0.5 tablets (25 mg total) by mouth 3 (three) times daily. Take an extra 1/2 tablet if your SBP>180 or DBP>110 3 tablet 1  . irbesartan (AVAPRO) 300 MG tablet Take 1 tablet (300 mg total) by mouth daily. 30 tablet 1  . LORazepam (ATIVAN) 1 MG tablet Take 1 tablet (1 mg total) 2 (two) times daily as needed by mouth. 180 tablet 0  . pantoprazole (PROTONIX) 40 MG tablet Take 1 tablet (40 mg total) by mouth daily. 30 tablet 5  . ranitidine (ZANTAC) 150 MG tablet Take 150 mg by mouth 2 (two) times daily.    . valACYclovir (VALTREX) 1000 MG tablet 2 tablets twice a day for 1 day as needed for cold sores 20 tablet 1  . zolpidem  (AMBIEN) 10 MG tablet TAKE 1 TABLET BY MOUTH AT BEDTIME AS NEEDED 30 tablet 2  . amLODipine (NORVASC) 10 MG tablet Take 1 tablet (10 mg total) by mouth daily. 30 tablet 1  . amoxicillin (AMOXIL) 875 MG tablet TAKE 1 TABLET(875 MG) BY MOUTH TWICE DAILY IN ADDITION TO AMOXICILLIN-CLAVULANATE TABLETS 20 tablet 0  . amoxicillin-clavulanate (AUGMENTIN) 875-125 MG tablet Take 1 tablet by mouth 2 (two) times daily for 10 days. 20 tablet 0   No current facility-administered medications for this visit.     OBJECTIVE: There were no vitals filed for this visit.   Body mass index is 19.12 kg/m.    ECOG FS:0 - Asymptomatic  General: Well-developed, well-nourished, no acute distress. Eyes: Pink conjunctiva, anicteric sclera. Lungs: Clear to auscultation bilaterally. Heart: Regular rate and rhythm. No rubs, murmurs, or gallops. Abdomen: Soft,  nontender, nondistended. No organomegaly noted, normoactive bowel sounds. Musculoskeletal: No edema, cyanosis, or clubbing. Neuro: Alert, answering all questions appropriately. Cranial nerves grossly intact. Skin: No rashes or petechiae noted. Psych: Normal affect.   LAB RESULTS:  Lab Results  Component Value Date   NA 133 (L) 04/05/2017   K 4.0 04/05/2017   CL 105 04/05/2017   CO2 19 (L) 04/05/2017   GLUCOSE 99 04/05/2017   BUN 29 (H) 04/05/2017   CREATININE 1.93 (H) 04/05/2017   CALCIUM 9.0 04/05/2017   PROT 6.3 (L) 12/28/2016   ALBUMIN 4.3 12/28/2016   AST 19 12/28/2016   ALT 13 (L) 12/28/2016   ALKPHOS 117 12/28/2016   BILITOT 0.5 12/28/2016   GFRNONAA 25 (L) 12/28/2016   GFRAA 29 (L) 12/28/2016    Lab Results  Component Value Date   WBC 4.4 05/09/2017   NEUTROABS 3.2 05/09/2017   HGB 7.2 (L) 05/09/2017   HCT 21.9 (L) 05/09/2017   MCV 90.3 05/09/2017   PLT 192 05/09/2017   Lab Results  Component Value Date   IRON 141 05/09/2017   TIBC 222 (L) 05/09/2017   IRONPCTSAT 63 (H) 05/09/2017   Lab Results  Component Value Date    FERRITIN 79 05/09/2017     STUDIES: No results found.  ASSESSMENT: Anemia in chronic kidney disease, unspecified stage.  PLAN:    1. Anemia in chronic kidney disease, unspecified stage: Patient's iron stores are within normal limits. She had a normal colonoscopy and EGD on Sep 07, 2016. CT of the abdomen and pelvis was essentially within normal limits. Patient has some mild chronic renal insufficiency with an inappropriately low erythropoietin level that is likely contributing. Her reticulocyte count is also inappropriately low for this level of anemia. Remainder of her blood work was either negative or within normal limits.  Because of patient's significantly decreased hemoglobin, will proceed with 1 unit of packed red blood cells tomorrow.  She also received 40,000 units subcutaneous Procrit today.  Return to clinic in 2 weeks for repeat laboratory work, further evaluation, and consideration of Procrit or blood transfusion.  If there is no significant improvement in patient's hemoglobin, she likely will require a bone marrow biopsy for further evaluation.    2. Chronic renal insufficiency: Patient states she is no longer seeing nephrology.  Approximately 30 minutes was spent in discussion of which greater than 50% was consultation.  Patient expressed understanding and was in agreement with this plan. She also understands that She can call clinic at any time with any questions, concerns, or complaints.   Lloyd Huger, MD   05/13/2017 3:46 PM

## 2017-05-09 ENCOUNTER — Inpatient Hospital Stay (HOSPITAL_BASED_OUTPATIENT_CLINIC_OR_DEPARTMENT_OTHER): Payer: Medicare Other | Admitting: Oncology

## 2017-05-09 ENCOUNTER — Inpatient Hospital Stay: Payer: Medicare Other | Attending: Oncology

## 2017-05-09 ENCOUNTER — Other Ambulatory Visit: Payer: Self-pay | Admitting: Oncology

## 2017-05-09 ENCOUNTER — Inpatient Hospital Stay: Payer: Medicare Other

## 2017-05-09 ENCOUNTER — Encounter: Payer: Self-pay | Admitting: Oncology

## 2017-05-09 ENCOUNTER — Other Ambulatory Visit: Payer: Self-pay

## 2017-05-09 VITALS — Wt 111.4 lb

## 2017-05-09 VITALS — BP 138/75 | HR 59

## 2017-05-09 DIAGNOSIS — D509 Iron deficiency anemia, unspecified: Secondary | ICD-10-CM

## 2017-05-09 DIAGNOSIS — R531 Weakness: Secondary | ICD-10-CM | POA: Insufficient documentation

## 2017-05-09 DIAGNOSIS — D649 Anemia, unspecified: Secondary | ICD-10-CM

## 2017-05-09 DIAGNOSIS — I129 Hypertensive chronic kidney disease with stage 1 through stage 4 chronic kidney disease, or unspecified chronic kidney disease: Secondary | ICD-10-CM

## 2017-05-09 DIAGNOSIS — D631 Anemia in chronic kidney disease: Secondary | ICD-10-CM | POA: Insufficient documentation

## 2017-05-09 DIAGNOSIS — N184 Chronic kidney disease, stage 4 (severe): Principal | ICD-10-CM

## 2017-05-09 LAB — CBC WITH DIFFERENTIAL/PLATELET
Basophils Absolute: 0.1 10*3/uL (ref 0–0.1)
Basophils Relative: 1 %
EOS PCT: 6 %
Eosinophils Absolute: 0.3 10*3/uL (ref 0–0.7)
HCT: 21.9 % — ABNORMAL LOW (ref 35.0–47.0)
HEMOGLOBIN: 7.2 g/dL — AB (ref 12.0–16.0)
LYMPHS ABS: 0.7 10*3/uL — AB (ref 1.0–3.6)
LYMPHS PCT: 15 %
MCH: 29.6 pg (ref 26.0–34.0)
MCHC: 32.8 g/dL (ref 32.0–36.0)
MCV: 90.3 fL (ref 80.0–100.0)
Monocytes Absolute: 0.2 10*3/uL (ref 0.2–0.9)
Monocytes Relative: 6 %
Neutro Abs: 3.2 10*3/uL (ref 1.4–6.5)
Neutrophils Relative %: 72 %
Platelets: 192 10*3/uL (ref 150–440)
RBC: 2.43 MIL/uL — AB (ref 3.80–5.20)
RDW: 16.1 % — ABNORMAL HIGH (ref 11.5–14.5)
WBC: 4.4 10*3/uL (ref 3.6–11.0)

## 2017-05-09 LAB — IRON AND TIBC
Iron: 141 ug/dL (ref 28–170)
Saturation Ratios: 63 % — ABNORMAL HIGH (ref 10.4–31.8)
TIBC: 222 ug/dL — AB (ref 250–450)
UIBC: 81 ug/dL

## 2017-05-09 LAB — PREPARE RBC (CROSSMATCH)

## 2017-05-09 LAB — FERRITIN: Ferritin: 79 ng/mL (ref 11–307)

## 2017-05-09 MED ORDER — EPOETIN ALFA 40000 UNIT/ML IJ SOLN
40000.0000 [IU] | Freq: Once | INTRAMUSCULAR | Status: AC
  Administered 2017-05-09: 40000 [IU] via SUBCUTANEOUS
  Filled 2017-05-09: qty 1

## 2017-05-09 NOTE — Progress Notes (Signed)
Patient reports weakness and fatigue.

## 2017-05-10 ENCOUNTER — Encounter: Payer: Self-pay | Admitting: Family Medicine

## 2017-05-10 ENCOUNTER — Ambulatory Visit (INDEPENDENT_AMBULATORY_CARE_PROVIDER_SITE_OTHER): Payer: Medicare Other | Admitting: Family Medicine

## 2017-05-10 ENCOUNTER — Other Ambulatory Visit: Payer: Self-pay | Admitting: Family Medicine

## 2017-05-10 ENCOUNTER — Inpatient Hospital Stay: Payer: Medicare Other

## 2017-05-10 VITALS — BP 173/71 | HR 72 | Temp 96.2°F | Resp 16

## 2017-05-10 DIAGNOSIS — I10 Essential (primary) hypertension: Secondary | ICD-10-CM

## 2017-05-10 DIAGNOSIS — I129 Hypertensive chronic kidney disease with stage 1 through stage 4 chronic kidney disease, or unspecified chronic kidney disease: Secondary | ICD-10-CM | POA: Diagnosis not present

## 2017-05-10 DIAGNOSIS — D649 Anemia, unspecified: Secondary | ICD-10-CM

## 2017-05-10 DIAGNOSIS — R531 Weakness: Secondary | ICD-10-CM | POA: Diagnosis not present

## 2017-05-10 DIAGNOSIS — D631 Anemia in chronic kidney disease: Secondary | ICD-10-CM | POA: Diagnosis not present

## 2017-05-10 DIAGNOSIS — J329 Chronic sinusitis, unspecified: Secondary | ICD-10-CM | POA: Diagnosis not present

## 2017-05-10 MED ORDER — AMLODIPINE BESYLATE 10 MG PO TABS: 10.0000 mg | ORAL_TABLET | Freq: Every day | ORAL | 1 refills | Status: DC

## 2017-05-10 MED ORDER — SODIUM CHLORIDE 0.9 % IV SOLN
250.0000 mL | Freq: Once | INTRAVENOUS | Status: AC
  Administered 2017-05-10: 250 mL via INTRAVENOUS
  Filled 2017-05-10: qty 250

## 2017-05-10 MED ORDER — ACETAMINOPHEN 325 MG PO TABS
650.0000 mg | ORAL_TABLET | Freq: Once | ORAL | Status: AC
  Administered 2017-05-10: 650 mg via ORAL
  Filled 2017-05-10: qty 2

## 2017-05-10 MED ORDER — DIPHENHYDRAMINE HCL 50 MG/ML IJ SOLN
25.0000 mg | Freq: Once | INTRAMUSCULAR | Status: AC
  Administered 2017-05-10: 25 mg via INTRAVENOUS
  Filled 2017-05-10: qty 1

## 2017-05-10 MED ORDER — HYDRALAZINE HCL 20 MG/ML IJ SOLN
10.0000 mg | Freq: Once | INTRAMUSCULAR | Status: AC
  Administered 2017-05-10: 10 mg via INTRAVENOUS
  Filled 2017-05-10: qty 1

## 2017-05-10 MED ORDER — AMOXICILLIN 875 MG PO TABS: ORAL_TABLET | ORAL | 0 refills | Status: DC

## 2017-05-10 MED ORDER — AMOXICILLIN-POT CLAVULANATE 875-125 MG PO TABS: 1.0000 | ORAL_TABLET | Freq: Two times a day (BID) | ORAL | 0 refills | Status: DC

## 2017-05-10 NOTE — Addendum Note (Signed)
Addended by: [name] on: 05/10/2017 05:47 PM   Modules accepted: Level of Service

## 2017-05-10 NOTE — Progress Notes (Signed)
Patient: Amy [name] Female    DOB: [birth year]   67 y.o.   MRN: [id number] Visit Date: 05/10/2017  Today's Provider: Lelon Huh, MD   Chief Complaint  Patient presents with  . Hypertension   Subjective:    Patient was seen at Dr. Gary Fleet office today for blood transfusion.. While there patient's blood pressure was 171/71. Patient states that she has a headache and racing heart. Dr. Grayland Ormond advised patient to see her pcp asap.   highest BP at his office was 197/72  BP Readings from Last 3 Encounters:  05/10/17 (!) 164/80  05/10/17 (!) 173/71  05/09/17 138/75   She has history of labile BP. States she is taking carvidol, irbesartan, clonidine, and amlodipine consistently. Hydralazine is written prn, but states she usually take 1/2 tablet a few times a day. She did take 1/2 of the 25mg  hydralazine this morning before transfusion and was given addition 10mg  IV during. She denies any unusual stress or anxiety, but she states he sinuses have been bothering her again.     Allergies  Allergen Reactions  . Cephalosporins   . Clarithromycin   . Lunesta  [Eszopiclone]     Heart racing  . Penicillin V     Has patient had a PCN reaction causing immediate rash, facial/tongue/throat swelling, SOB or lightheadedness with hypotension: Yes Has patient had a PCN reaction causing severe rash involving mucus membranes or skin necrosis: No Has patient had a PCN reaction that required hospitalization No Has patient had a PCN reaction occurring within the last 10 years: No If all of the above answers are "NO", then may proceed with Cephalosporin use.  . Sulfa Antibiotics      Current Outpatient Medications:  .  amLODipine (NORVASC) 5 MG tablet, Take 1 tablet (5 mg total) daily by mouth., Disp: 30 tablet, Rfl: 3 .  aspirin EC 81 MG EC tablet, Take 1 tablet (81 mg total) by mouth daily., Disp: , Rfl:  .  buPROPion (WELLBUTRIN) 75 MG tablet, TAKE 2 TABLETS BY MOUTH DAILY, Disp: 180  tablet, Rfl: 2 .  butalbital-acetaminophen-caffeine (FIORICET, ESGIC) 50-325-40 MG tablet, TAKE 1 TO 2 TABLETS BY MOUTH EVERY 4-6 HOURS AS NEEDED FOR PAIN. MAX OF 8 PER DAY, Disp: 150 tablet, Rfl: 4 .  carvedilol (COREG) 12.5 MG tablet, TAKE 1 TABLET(25 MG) BY MOUTH TWICE DAILY WITH A MEAL, Disp: 60 tablet, Rfl: 3 .  cloNIDine (CATAPRES) 0.1 MG tablet, Take 1 tablet (0.1 mg total) by mouth 2 (two) times daily., Disp: 60 tablet, Rfl: 1 .  fluticasone (FLONASE) 50 MCG/ACT nasal spray, Place 1-2 sprays into both nostrils daily., Disp: 16 g, Rfl: 5 .  furosemide (LASIX) 20 MG tablet, furosemide 20 mg tablet  TK 1 T PO QD PRF SWELLING, Disp: , Rfl:  .  hydrALAZINE (APRESOLINE) 50 MG tablet, Take 0.5 tablets (25 mg total) by mouth 3 (three) times daily. Take an extra 1/2 tablet if your SBP>180 or DBP>110, Disp: 3 tablet, Rfl: 1 .  irbesartan (AVAPRO) 300 MG tablet, Take 1 tablet (300 mg total) by mouth daily., Disp: 30 tablet, Rfl: 1 .  LORazepam (ATIVAN) 1 MG tablet, Take 1 tablet (1 mg total) 2 (two) times daily as needed by mouth., Disp: 180 tablet, Rfl: 0 .  pantoprazole (PROTONIX) 40 MG tablet, Take 1 tablet (40 mg total) by mouth daily., Disp: 30 tablet, Rfl: 5 .  ranitidine (ZANTAC) 150 MG tablet, Take 150 mg by mouth 2 (two)  times daily., Disp: , Rfl:  .  valACYclovir (VALTREX) 1000 MG tablet, 2 tablets twice a day for 1 day as needed for cold sores, Disp: 20 tablet, Rfl: 1 .  zolpidem (AMBIEN) 10 MG tablet, TAKE 1 TABLET BY MOUTH AT BEDTIME AS NEEDED, Disp: 30 tablet, Rfl: 2  Review of Systems  Constitutional: Negative for appetite change, chills, fatigue and fever.  Respiratory: Negative for chest tightness and shortness of breath.   Cardiovascular: Negative for chest pain and palpitations.  Gastrointestinal: Negative for abdominal pain, nausea and vomiting.  Neurological: Negative for dizziness and weakness.    Social History   Tobacco Use  . Smoking status: Never Smoker  . Smokeless  tobacco: Never Used  Substance Use Topics  . Alcohol use: Yes    Alcohol/week: 0.0 oz    Comment: few glasses a wine/wk.   Objective:   BP (!) 164/80 (BP Location: Right Arm, Patient Position: Sitting, Cuff Size: Normal)   Pulse 87   Temp 97.9 F (36.6 C) (Oral)   Resp 16   Wt 111 lb (50.3 kg)   SpO2 98%   BMI 19.05 kg/m  Vitals:   05/10/17 1457  BP: (!) 164/80  Pulse: 87  Resp: 16  Temp: 97.9 F (36.6 C)  TempSrc: Oral  SpO2: 98%  Weight: 111 lb (50.3 kg)     Physical Exam  General Appearance:    Alert, cooperative, no distress  HENT:   bilateral TM normal without fluid or infection, neck without nodes and frontal sinus tender  Eyes:    PERRL, conjunctiva/corneas clear, EOM's intact       Lungs:     Clear to auscultation bilaterally, respirations unlabored  Heart:    Regular rate and rhythm  Neurologic:   Awake, alert, oriented x 3. No apparent focal neurological           defect.           Assessment & Plan:     1. Essential hypertension Worsening. She is to increase amLODipine (NORVASC) 10 MG tablet; Take 1 tablet (10 mg total) by mouth daily.  Dispense: 30 tablet; Refill: 1  Advised she can skip hydralazine if SBP<120, but continue all other BP medications. Return to check BP in 10 days.   2. Recurrent sinus infections  - amoxicillin (AMOXIL) 875 MG tablet; TAKE 1 TABLET(875 MG) BY MOUTH TWICE DAILY IN ADDITION TO AMOXICILLIN-CLAVULANATE TABLETS  Dispense: 20 tablet; Refill: 0 - amoxicillin-clavulanate (AUGMENTIN) 875-125 MG tablet; Take 1 tablet by mouth 2 (two) times daily for 10 days.  Dispense: 20 tablet; Refill: 0       Lelon Huh, MD  [facility]

## 2017-05-10 NOTE — Progress Notes (Signed)
Called Dr. Grayland Ormond at 1023 regarding hypertension during blood transfusion. Ordered to give blood transfusion slower and recheck in 30 mins. BP then went down to systolic in the 270'B. Pt has no c/o of chest discomfort or SOB.  1245 After transfusion was completed called Dr. Grayland Ormond again for a BP of 199/77. Ordered to give 10 mg of Hydralazine.

## 2017-05-10 NOTE — Progress Notes (Signed)
BP impoved, 173/71 after hydralazine given. Per Dr Grayland Ormond okay for patient to leave, reminded to patient to follow up with her PCP regarding hypertension. Patient verbalized understanding

## 2017-05-11 ENCOUNTER — Ambulatory Visit: Payer: Medicare Other

## 2017-05-11 ENCOUNTER — Other Ambulatory Visit: Payer: Medicare Other

## 2017-05-11 ENCOUNTER — Ambulatory Visit: Payer: Medicare Other | Admitting: Oncology

## 2017-05-11 LAB — TYPE AND SCREEN
ABO/RH(D): O POS
Antibody Screen: NEGATIVE
Unit division: 0

## 2017-05-11 LAB — BPAM RBC
Blood Product Expiration Date: 201901162359
ISSUE DATE / TIME: 201901081001
Unit Type and Rh: 5100

## 2017-05-20 ENCOUNTER — Ambulatory Visit (INDEPENDENT_AMBULATORY_CARE_PROVIDER_SITE_OTHER): Payer: Medicare Other | Admitting: Family Medicine

## 2017-05-20 ENCOUNTER — Encounter: Payer: Self-pay | Admitting: Family Medicine

## 2017-05-20 DIAGNOSIS — I10 Essential (primary) hypertension: Secondary | ICD-10-CM

## 2017-05-20 DIAGNOSIS — R51 Headache: Secondary | ICD-10-CM | POA: Diagnosis not present

## 2017-05-20 DIAGNOSIS — G8929 Other chronic pain: Secondary | ICD-10-CM

## 2017-05-20 MED ORDER — CARVEDILOL 12.5 MG PO TABS: 25.0000 mg | ORAL_TABLET | Freq: Two times a day (BID) | ORAL | 0 refills | Status: DC

## 2017-05-20 MED ORDER — HYDRALAZINE HCL 50 MG PO TABS: 25.0000 mg | ORAL_TABLET | Freq: Three times a day (TID) | ORAL | 1 refills | Status: DC

## 2017-05-20 MED ORDER — BUTALBITAL-APAP-CAFFEINE 50-325-40 MG PO TABS: ORAL_TABLET | ORAL | 4 refills | Status: DC

## 2017-05-20 NOTE — Patient Instructions (Signed)
   Increase 12.5mg  carvedilol to 2 (TWO) tablets twice a day   Take 1/2 tablet hydroalazine if your BP>160/100   Continue all other blood pressure medications unchanged.

## 2017-05-20 NOTE — Progress Notes (Signed)
Patient: Amy [name] Female    DOB: 12/26/1950   67 y.o.   MRN: [id number] Visit Date: 05/20/2017  Today's Provider: Lelon Huh, MD   Chief Complaint  Patient presents with  . Hypertension    10 day follow up   Subjective:    HPI  Hypertension, follow-up:  BP Readings from Last 3 Encounters:  05/10/17 (!) 164/80  05/10/17 (!) 173/71  05/09/17 138/75    She was last seen for hypertension 10 days ago.  BP at that visit was 164/80. Management since that visit includes increasing Amlodipine to 10mg  daily. She reports good compliance with treatment. She is not having side effects.  She is not exercising. She is adherent to low salt diet.   Outside blood pressures are not being checked. She is experiencing fatigue.  Patient denies chest pain, chest pressure/discomfort, claudication, dyspnea, exertional chest pressure/discomfort, fatigue, irregular heart beat, lower extremity edema, near-syncope, orthopnea, palpitations, paroxysmal nocturnal dyspnea, syncope and tachypnea.   Cardiovascular risk factors include advanced age (older than 35 for men, 19 for women) and hypertension.  Use of agents associated with hypertension: NSAIDS.     Weight trend: stable Wt Readings from Last 3 Encounters:  05/10/17 111 lb (50.3 kg)  05/09/17 111 lb 6.4 oz (50.5 kg)  04/19/17 110 lb (49.9 kg)    Current diet: in general, a "healthy" diet    She has not been taking hydralazine. She was under the impression that she is only to take it prn for very high blood pressure, but has not  Been checking home BP regularly. He is taking clonidine 0.1mg  twice every day and is taking carvedilol, amlodipine, and irbesartan consistently.  She states she has hematology appointment on Monday and may be getting transfusion.  ------------------------------------------------------------------------     Allergies  Allergen Reactions  . Cephalosporins   . Clarithromycin   . Lunesta   [Eszopiclone]     Heart racing  . Penicillin V     Has patient had a PCN reaction causing immediate rash, facial/tongue/throat swelling, SOB or lightheadedness with hypotension: Yes Has patient had a PCN reaction causing severe rash involving mucus membranes or skin necrosis: No Has patient had a PCN reaction that required hospitalization No Has patient had a PCN reaction occurring within the last 10 years: No If all of the above answers are "NO", then may proceed with Cephalosporin use.  . Sulfa Antibiotics      Current Outpatient Medications:  .  amLODipine (NORVASC) 10 MG tablet, Take 1 tablet (10 mg total) by mouth daily., Disp: 30 tablet, Rfl: 1 .  amoxicillin (AMOXIL) 875 MG tablet, TAKE 1 TABLET(875 MG) BY MOUTH TWICE DAILY IN ADDITION TO AMOXICILLIN-CLAVULANATE TABLETS, Disp: 20 tablet, Rfl: 0 .  aspirin EC 81 MG EC tablet, Take 1 tablet (81 mg total) by mouth daily., Disp: , Rfl:  .  buPROPion (WELLBUTRIN) 75 MG tablet, TAKE 2 TABLETS BY MOUTH DAILY, Disp: 180 tablet, Rfl: 2 .  butalbital-acetaminophen-caffeine (FIORICET, ESGIC) 50-325-40 MG tablet, TAKE 1 TO 2 TABLETS BY MOUTH EVERY 4-6 HOURS AS NEEDED FOR PAIN. MAX OF 8 PER DAY, Disp: 150 tablet, Rfl: 4 .  carvedilol (COREG) 12.5 MG tablet, TAKE 1 TABLET(25 MG) BY MOUTH TWICE DAILY WITH A MEAL, Disp: 60 tablet, Rfl: 3 .  cloNIDine (CATAPRES) 0.1 MG tablet, Take 1 tablet (0.1 mg total) by mouth 2 (two) times daily., Disp: 60 tablet, Rfl: 1 .  fluticasone (FLONASE) 50 MCG/ACT nasal spray,  Place 1-2 sprays into both nostrils daily., Disp: 16 g, Rfl: 5 .  furosemide (LASIX) 20 MG tablet, furosemide 20 mg tablet  TK 1 T PO QD PRF SWELLING, Disp: , Rfl:  .  hydrALAZINE (APRESOLINE) 50 MG tablet, Take 0.5 tablets (25 mg total) by mouth 3 (three) times daily. Take an extra 1/2 tablet if your SBP>180 or DBP>110, Disp: 3 tablet, Rfl: 1 .  irbesartan (AVAPRO) 300 MG tablet, Take 1 tablet (300 mg total) by mouth daily., Disp: 30 tablet, Rfl:  1 .  LORazepam (ATIVAN) 1 MG tablet, Take 1 tablet (1 mg total) 2 (two) times daily as needed by mouth., Disp: 180 tablet, Rfl: 0 .  pantoprazole (PROTONIX) 40 MG tablet, Take 1 tablet (40 mg total) by mouth daily., Disp: 30 tablet, Rfl: 5 .  ranitidine (ZANTAC) 150 MG tablet, Take 150 mg by mouth 2 (two) times daily., Disp: , Rfl:  .  valACYclovir (VALTREX) 1000 MG tablet, 2 tablets twice a day for 1 day as needed for cold sores, Disp: 20 tablet, Rfl: 1 .  zolpidem (AMBIEN) 10 MG tablet, TAKE 1 TABLET BY MOUTH AT BEDTIME AS NEEDED, Disp: 30 tablet, Rfl: 2 .  amoxicillin-clavulanate (AUGMENTIN) 875-125 MG tablet, Take 1 tablet by mouth 2 (two) times daily for 10 days. (Patient not taking: Reported on 05/20/2017), Disp: 20 tablet, Rfl: 0  Review of Systems  Constitutional: Positive for fatigue. Negative for appetite change, chills and fever.  Respiratory: Negative for chest tightness and shortness of breath.   Cardiovascular: Negative for chest pain and palpitations.  Gastrointestinal: Negative for abdominal pain, nausea and vomiting.  Neurological: Positive for headaches. Negative for dizziness and weakness.    Social History   Tobacco Use  . Smoking status: Never Smoker  . Smokeless tobacco: Never Used  Substance Use Topics  . Alcohol use: Yes    Alcohol/week: 0.0 oz    Comment: few glasses a wine/wk.   Objective:   BP (!) 178/76 (BP Location: Left Arm, Cuff Size: Normal)   Pulse 68   Temp 98.5 F (36.9 C) (Oral)   Resp 16   Wt 110 lb (49.9 kg)   SpO2 100% Comment: room air  BMI 18.88 kg/m  Vitals:   05/20/17 1343 05/20/17 1346  BP: (!) 172/66 (!) 178/76  Pulse: 68   Resp: 16   Temp: 98.5 F (36.9 C)   TempSrc: Oral   SpO2: 100%   Weight: 110 lb (49.9 kg)      Physical Exam   General Appearance:    Alert, cooperative, no distress  Eyes:    PERRL, conjunctiva/corneas clear, EOM's intact       Lungs:     Clear to auscultation bilaterally, respirations unlabored   Heart:    Regular rate and rhythm  Neurologic:   Awake, alert, oriented x 3. No apparent focal neurological           defect.           Assessment & Plan:     1. Essential hypertension Will double dose of carvedilol. She has  Enough of the 12.5 mg to get by about 12 days. She will return in 10 days for BP check. If better will work on getting off of clonidine which can cause labile BP  - carvedilol (COREG) 12.5 MG tablet; Take 2 tablets (25 mg total) by mouth 2 (two) times daily.  Dispense: 3 tablet; Refill: 0  2. Chronic nonintractable headache, unspecified headache type Needs refill.  -  butalbital-acetaminophen-caffeine (FIORICET, ESGIC) 50-325-40 MG tablet; TAKE 1 TO 2 TABLETS BY MOUTH EVERY 4-6 HOURS AS NEEDED FOR PAIN. MAX OF 8 PER DAY  Dispense: 150 tablet; Refill: [place], MD  [facility]

## 2017-05-22 NOTE — Progress Notes (Signed)
[place]  [phone] [phone]  ID: [name] OB: 1950/08/23  MR#: [id number]  XHB#:[id number]  Patient Care Team: [name], MD as PCP - General (Family Medicine) Thornton Park, MD as Referring Physician (Orthopedic Surgery) Lloyd Huger, MD as Consulting Physician (Oncology) Minna Merritts, MD as Consulting Physician (Cardiology) Murlean Iba, MD as Referring Physician (Nephrology)  CHIEF COMPLAINT:  Anemia in chronic kidney disease, unspecified stage.  INTERVAL HISTORY: Patient returns to clinic today for repeat laboratory work, further evaluation, and continuation of Procrit.  She continues to have persistent weakness and fatigue, but states it is mildly improved since receiving a transfusion several weeks ago.  She otherwise feels well.  She has no neurologic complaints. She denies any fevers. She has a good appetite and denies weight loss. She has no chest pain or shortness of breath. She denies any nausea, vomiting, constipation, or diarrhea.  She denies any melena or hematochezia.  She has no urinary complaints. Patient otherwise feels well and offers no further specific complaints.  REVIEW OF SYSTEMS:   Review of Systems  Constitutional: Positive for malaise/fatigue. Negative for fever and weight loss.  Respiratory: Negative.  Negative for cough and shortness of breath.   Cardiovascular: Negative.  Negative for chest pain and leg swelling.  Gastrointestinal: Negative.  Negative for abdominal pain, blood in stool and melena.  Genitourinary: Negative.   Musculoskeletal: Negative.   Skin: Negative.  Negative for rash.  Neurological: Positive for weakness.  Psychiatric/Behavioral: The patient is nervous/anxious.     As per HPI. Otherwise, a complete review of systems is negative.  PAST MEDICAL HISTORY: Past Medical History:  Diagnosis Date  . Anemia    a. 08/2016 Guaiac + stool. EGD w/ gastritis.  . Anxiety    . CKD (chronic kidney disease), stage III ([place])   . Colon polyp    a. 08/2016 Colonoscopy.  . Gastritis    a. 08/2016 EGD: Gastritis-->PPI.  [name] GERD (gastroesophageal reflux disease)   . History of chicken pox   . History of measles as a child   . Hypertension   . Hyponatremia    a. 08/2016 in setting of HCTZ Rx.  . Multiple thyroid nodules     PAST SURGICAL HISTORY: Past Surgical History:  Procedure Laterality Date  . ABDOMINAL HYSTERECTOMY  2003   due to heavy periods and clotting during menses  . BREAST LUMPECTOMY Left 1990  . CHOLECYSTECTOMY  2010  . COLONOSCOPY WITH PROPOFOL N/A 09/07/2016   Procedure: COLONOSCOPY WITH PROPOFOL;  Surgeon: Lucilla Lame, MD;  Location: [facility] ENDOSCOPY;  Service: Endoscopy;  Laterality: N/A;  . DILATION AND CURETTAGE OF UTERUS  1985  . ESOPHAGOGASTRODUODENOSCOPY (EGD) WITH PROPOFOL N/A 09/07/2016   Procedure: ESOPHAGOGASTRODUODENOSCOPY (EGD) WITH PROPOFOL;  Surgeon: Lucilla Lame, MD;  Location: [facility] ENDOSCOPY;  Service: Endoscopy;  Laterality: N/A;  . GALLBLADDER SURGERY  2012  . KNEE SURGERY     Dr. Tamala Julian  . MRI, Lumbar spine  07/23/2009   Multilevel annular bulge and disc protrusion at L2-L3, L3-L4, L4-L5, and L5-S1  . TONSILLECTOMY    . TONSILLECTOMY AND ADENOIDECTOMY  1970  . UPPER GI ENDOSCOPY  03/02/2006   H. Pylori negative; Dr. Allen Norris    FAMILY HISTORY: Family History  Problem Relation Age of Onset  . Pancreatic cancer Father   . Congestive Heart Failure Maternal Grandmother   . Cancer Paternal Grandfather   . Heart disease Maternal Uncle     ADVANCED DIRECTIVES (Y/N):  N  HEALTH MAINTENANCE: Social History   Tobacco Use  . Smoking status: Never Smoker  . Smokeless tobacco: Never Used  Substance Use Topics  . Alcohol use: Yes    Alcohol/week: 0.0 oz    Comment: few glasses a wine/wk.  . Drug use: No     Colonoscopy:  PAP:  Bone density:  Lipid panel:  Allergies  Allergen Reactions  . Cephalosporins   .  Clarithromycin   . Lunesta  [Eszopiclone]     Heart racing  . Penicillin V     Has patient had a PCN reaction causing immediate rash, facial/tongue/throat swelling, SOB or lightheadedness with hypotension: Yes Has patient had a PCN reaction causing severe rash involving mucus membranes or skin necrosis: No Has patient had a PCN reaction that required hospitalization No Has patient had a PCN reaction occurring within the last 10 years: No If all of the above answers are "NO", then may proceed with Cephalosporin use.  . Sulfa Antibiotics     Current Outpatient Medications  Medication Sig Dispense Refill  . amLODipine (NORVASC) 10 MG tablet Take 1 tablet (10 mg total) by mouth daily. 30 tablet 1  . buPROPion (WELLBUTRIN) 75 MG tablet TAKE 2 TABLETS BY MOUTH DAILY 180 tablet 2  . butalbital-acetaminophen-caffeine (FIORICET, ESGIC) 50-325-40 MG tablet TAKE 1 TO 2 TABLETS BY MOUTH EVERY 4-6 HOURS AS NEEDED FOR PAIN. MAX OF 8 PER DAY 150 tablet 4  . carvedilol (COREG) 12.5 MG tablet Take 2 tablets (25 mg total) by mouth 2 (two) times daily. 3 tablet 0  . cloNIDine (CATAPRES) 0.1 MG tablet Take 1 tablet (0.1 mg total) by mouth 2 (two) times daily. 60 tablet 1  . fluticasone (FLONASE) 50 MCG/ACT nasal spray Place 1-2 sprays into both nostrils daily. 16 g 5  . hydrALAZINE (APRESOLINE) 50 MG tablet Take 0.5 tablets (25 mg total) by mouth 3 (three) times daily. if your Systolic RP>594 or Diastolic VO>592 3 tablet 1  . irbesartan (AVAPRO) 300 MG tablet Take 1 tablet (300 mg total) by mouth daily. 30 tablet 1  . LORazepam (ATIVAN) 1 MG tablet Take 1 tablet (1 mg total) 2 (two) times daily as needed by mouth. 180 tablet 0  . pantoprazole (PROTONIX) 40 MG tablet Take 1 tablet (40 mg total) by mouth daily. 30 tablet 5  . ranitidine (ZANTAC) 150 MG tablet Take 150 mg by mouth 2 (two) times daily.    . valACYclovir (VALTREX) 1000 MG tablet 2 tablets twice a day for 1 day as needed for cold sores 20 tablet 1    . zolpidem (AMBIEN) 10 MG tablet TAKE 1 TABLET BY MOUTH AT BEDTIME AS NEEDED 30 tablet 2  . aspirin EC 81 MG EC tablet Take 1 tablet (81 mg total) by mouth daily. (Patient not taking: Reported on 05/23/2017)    . furosemide (LASIX) 20 MG tablet furosemide 20 mg tablet  TK 1 T PO QD PRF SWELLING     No current facility-administered medications for this visit.     OBJECTIVE: Vitals:   05/23/17 0956  BP: 113/69  Pulse: 73  Resp: 18  Temp: 97.9 F (36.6 C)     Body mass index is 18.92 kg/m.    ECOG FS:0 - Asymptomatic  General: Well-developed, well-nourished, no acute distress. Eyes: Pink conjunctiva, anicteric sclera. Lungs: Clear to auscultation bilaterally. Heart: Regular rate and rhythm. No rubs, murmurs, or gallops. Abdomen: Soft, nontender, nondistended. No organomegaly noted, normoactive bowel sounds. Musculoskeletal: No edema, cyanosis,  or clubbing. Neuro: Alert, answering all questions appropriately. Cranial nerves grossly intact. Skin: No rashes or petechiae noted. Psych: Normal affect.   LAB RESULTS:  Lab Results  Component Value Date   NA 133 (L) 04/05/2017   K 4.0 04/05/2017   CL 105 04/05/2017   CO2 19 (L) 04/05/2017   GLUCOSE 99 04/05/2017   BUN 29 (H) 04/05/2017   CREATININE 1.93 (H) 04/05/2017   CALCIUM 9.0 04/05/2017   PROT 6.3 (L) 12/28/2016   ALBUMIN 4.3 12/28/2016   AST 19 12/28/2016   ALT 13 (L) 12/28/2016   ALKPHOS 117 12/28/2016   BILITOT 0.5 12/28/2016   GFRNONAA 25 (L) 12/28/2016   GFRAA 29 (L) 12/28/2016    Lab Results  Component Value Date   WBC 3.6 05/23/2017   NEUTROABS 2.4 05/23/2017   HGB 10.0 (L) 05/23/2017   HCT 31.1 (L) 05/23/2017   MCV 92.6 05/23/2017   PLT 202 05/23/2017   Lab Results  Component Value Date   IRON 141 05/09/2017   TIBC 222 (L) 05/09/2017   IRONPCTSAT 63 (H) 05/09/2017   Lab Results  Component Value Date   FERRITIN 79 05/09/2017     STUDIES: No results found.  ASSESSMENT: Anemia in chronic  kidney disease, unspecified stage.  PLAN:    1. Anemia in chronic kidney disease, unspecified stage: Patient's iron stores are within normal limits. She had a normal colonoscopy and EGD on Sep 07, 2016. CT of the abdomen and pelvis was essentially within normal limits. Patient has some mild chronic renal insufficiency with an inappropriately low erythropoietin level that is likely contributing. Her reticulocyte count is also inappropriately low for this level of anemia. Remainder of her blood work was either negative or within normal limits.  Patient's hemoglobin is 10.0 today, therefore we will proceed with 40,000 units of Procrit today.  She does not require blood transfusion at this time. Return to clinic in 2 weeks for repeat laboratory work and consideration of Procrit and then in 4 weeks for repeat laboratory work and further evaluation.  If there is no significant improvement in patient's hemoglobin with Procrit, she likely will require a bone marrow biopsy for further evaluation.    2. Chronic renal insufficiency: Patient states she is no longer seeing nephrology. 3.  Hypertension: Patient's blood pressure is within normal limits today.  Proceed with Procrit as above.  Approximately 30 minutes was spent in discussion of which greater than 50% was consultation.  Patient expressed understanding and was in agreement with this plan. She also understands that She can call clinic at any time with any questions, concerns, or complaints.   Lloyd Huger, MD   05/23/2017 10:29 AM

## 2017-05-23 ENCOUNTER — Inpatient Hospital Stay: Payer: Medicare Other

## 2017-05-23 ENCOUNTER — Ambulatory Visit: Payer: Medicare Other | Admitting: Oncology

## 2017-05-23 ENCOUNTER — Ambulatory Visit: Payer: Medicare Other

## 2017-05-23 ENCOUNTER — Other Ambulatory Visit: Payer: Medicare Other

## 2017-05-23 ENCOUNTER — Inpatient Hospital Stay (HOSPITAL_BASED_OUTPATIENT_CLINIC_OR_DEPARTMENT_OTHER): Payer: Medicare Other | Admitting: Oncology

## 2017-05-23 VITALS — BP 113/69 | HR 73 | Temp 97.9°F | Resp 18 | Wt 110.2 lb

## 2017-05-23 DIAGNOSIS — D631 Anemia in chronic kidney disease: Secondary | ICD-10-CM | POA: Diagnosis not present

## 2017-05-23 DIAGNOSIS — R531 Weakness: Secondary | ICD-10-CM

## 2017-05-23 DIAGNOSIS — I129 Hypertensive chronic kidney disease with stage 1 through stage 4 chronic kidney disease, or unspecified chronic kidney disease: Secondary | ICD-10-CM | POA: Diagnosis not present

## 2017-05-23 DIAGNOSIS — N184 Chronic kidney disease, stage 4 (severe): Principal | ICD-10-CM

## 2017-05-23 DIAGNOSIS — D509 Iron deficiency anemia, unspecified: Secondary | ICD-10-CM

## 2017-05-23 LAB — SAMPLE TO BLOOD BANK

## 2017-05-23 LAB — CBC WITH DIFFERENTIAL/PLATELET
Basophils Absolute: 0.1 10*3/uL (ref 0–0.1)
Basophils Relative: 2 %
EOS PCT: 8 %
Eosinophils Absolute: 0.3 10*3/uL (ref 0–0.7)
HCT: 31.1 % — ABNORMAL LOW (ref 35.0–47.0)
Hemoglobin: 10 g/dL — ABNORMAL LOW (ref 12.0–16.0)
LYMPHS ABS: 0.6 10*3/uL — AB (ref 1.0–3.6)
Lymphocytes Relative: 18 %
MCH: 29.9 pg (ref 26.0–34.0)
MCHC: 32.3 g/dL (ref 32.0–36.0)
MCV: 92.6 fL (ref 80.0–100.0)
MONOS PCT: 7 %
Monocytes Absolute: 0.3 10*3/uL (ref 0.2–0.9)
Neutro Abs: 2.4 10*3/uL (ref 1.4–6.5)
Neutrophils Relative %: 65 %
PLATELETS: 202 10*3/uL (ref 150–440)
RBC: 3.36 MIL/uL — ABNORMAL LOW (ref 3.80–5.20)
RDW: 19.6 % — AB (ref 11.5–14.5)
WBC: 3.6 10*3/uL (ref 3.6–11.0)

## 2017-05-23 MED ORDER — EPOETIN ALFA 40000 UNIT/ML IJ SOLN
40000.0000 [IU] | Freq: Once | INTRAMUSCULAR | Status: AC
Start: 2017-05-23 — End: 2017-05-23
  Administered 2017-05-23: 40000 [IU] via SUBCUTANEOUS
  Filled 2017-05-23: qty 1

## 2017-05-24 ENCOUNTER — Other Ambulatory Visit: Payer: Self-pay | Admitting: Family Medicine

## 2017-05-24 DIAGNOSIS — J329 Chronic sinusitis, unspecified: Secondary | ICD-10-CM

## 2017-05-31 ENCOUNTER — Encounter: Payer: Self-pay | Admitting: Family Medicine

## 2017-05-31 ENCOUNTER — Ambulatory Visit (INDEPENDENT_AMBULATORY_CARE_PROVIDER_SITE_OTHER): Payer: Medicare Other | Admitting: Family Medicine

## 2017-05-31 VITALS — BP 140/70 | HR 77 | Temp 97.6°F | Resp 16 | Wt 109.8 lb

## 2017-05-31 DIAGNOSIS — R059 Cough, unspecified: Secondary | ICD-10-CM

## 2017-05-31 DIAGNOSIS — R05 Cough: Secondary | ICD-10-CM

## 2017-05-31 DIAGNOSIS — I10 Essential (primary) hypertension: Secondary | ICD-10-CM | POA: Diagnosis not present

## 2017-05-31 MED ORDER — GUAIFENESIN-CODEINE 100-10 MG/5ML PO SOLN: 5.0000 mL | Freq: Three times a day (TID) | ORAL | 0 refills | Status: AC | PRN

## 2017-05-31 MED ORDER — CARVEDILOL 25 MG PO TABS: 25.0000 mg | ORAL_TABLET | Freq: Two times a day (BID) | ORAL | 4 refills | Status: DC

## 2017-05-31 NOTE — Progress Notes (Addendum)
Patient: Amy [name] Female    DOB: 08/12/1950   67 y.o.   MRN: [id number] Visit Date: 05/31/2017  Today's Provider: Lelon Huh, MD   Chief Complaint  Patient presents with  . Follow-up    BP   Subjective:    HPI  Hypertension, follow-up:  BP Readings from Last 3 Encounters:  05/31/17 140/70  05/23/17 113/69  05/20/17 (!) 178/76    She was last seen for hypertension 10 days ago.  BP at that visit was 176/66. Management since that visit includes double dose of Carvedilol. She reports excellent compliance with treatment. She is not having side effects.  She is not exercising. She is adherent to low salt diet.   Outside blood pressures are not being check. She is experiencing none.  Patient denies chest pain, chest pressure/discomfort, claudication, exertional chest pressure/discomfort, fatigue, irregular heart beat, lower extremity edema, near-syncope and palpitations.   Cardiovascular risk factors include advanced age (older than 44 for men, 63 for women) and hypertension.     Weight trend: stable Wt Readings from Last 3 Encounters:  05/31/17 109 lb 12.8 oz (49.8 kg)  05/23/17 110 lb 3.2 oz (50 kg)  05/20/17 110 lb (49.9 kg)    Current diet: in general, a "healthy" diet    ------------------------------------------------------------------------ Cough She reports she continues to have persistent sinus congestion and drainage causing a cough. She has been on several rounds of augmenting and amoxicillin, but cough won;t quite resolve.     Allergies  Allergen Reactions  . Cephalosporins   . Clarithromycin   . Lunesta  [Eszopiclone]     Heart racing  . Penicillin V     Has patient had a PCN reaction causing immediate rash, facial/tongue/throat swelling, SOB or lightheadedness with hypotension: Yes Has patient had a PCN reaction causing severe rash involving mucus membranes or skin necrosis: No Has patient had a PCN reaction that required  hospitalization No Has patient had a PCN reaction occurring within the last 10 years: No If all of the above answers are "NO", then may proceed with Cephalosporin use.  . Sulfa Antibiotics      Current Outpatient Medications:  .  amLODipine (NORVASC) 10 MG tablet, Take 1 tablet (10 mg total) by mouth daily., Disp: 30 tablet, Rfl: 1 .  aspirin EC 81 MG EC tablet, Take 1 tablet (81 mg total) by mouth daily., Disp: , Rfl:  .  buPROPion (WELLBUTRIN) 75 MG tablet, TAKE 2 TABLETS BY MOUTH DAILY, Disp: 180 tablet, Rfl: 2 .  butalbital-acetaminophen-caffeine (FIORICET, ESGIC) 50-325-40 MG tablet, TAKE 1 TO 2 TABLETS BY MOUTH EVERY 4-6 HOURS AS NEEDED FOR PAIN. MAX OF 8 PER DAY, Disp: 150 tablet, Rfl: 4 .  carvedilol (COREG) 12.5 MG tablet, Take 2 tablets (25 mg total) by mouth 2 (two) times daily., Disp: 3 tablet, Rfl: 0 .  cloNIDine (CATAPRES) 0.1 MG tablet, Take 1 tablet (0.1 mg total) by mouth 2 (two) times daily., Disp: 60 tablet, Rfl: 1 .  fluticasone (FLONASE) 50 MCG/ACT nasal spray, Place 1-2 sprays into both nostrils daily., Disp: 16 g, Rfl: 5 .  furosemide (LASIX) 20 MG tablet, furosemide 20 mg tablet  TK 1 T PO QD PRF SWELLING, Disp: , Rfl:  .  hydrALAZINE (APRESOLINE) 50 MG tablet, Take 0.5 tablets (25 mg total) by mouth 3 (three) times daily. if your Systolic SW>109 or Diastolic NA>355, Disp: 3 tablet, Rfl: 1 .  irbesartan (AVAPRO) 300 MG tablet, Take 1 tablet (  300 mg total) by mouth daily., Disp: 30 tablet, Rfl: 1 .  LORazepam (ATIVAN) 1 MG tablet, Take 1 tablet (1 mg total) 2 (two) times daily as needed by mouth., Disp: 180 tablet, Rfl: 0 .  pantoprazole (PROTONIX) 40 MG tablet, Take 1 tablet (40 mg total) by mouth daily., Disp: 30 tablet, Rfl: 5 .  ranitidine (ZANTAC) 150 MG tablet, Take 150 mg by mouth 2 (two) times daily., Disp: , Rfl:  .  valACYclovir (VALTREX) 1000 MG tablet, 2 tablets twice a day for 1 day as needed for cold sores, Disp: 20 tablet, Rfl: 1 .  zolpidem (AMBIEN) 10 MG  tablet, TAKE 1 TABLET BY MOUTH AT BEDTIME AS NEEDED, Disp: 30 tablet, Rfl: 2 .  amoxicillin (AMOXIL) 875 MG tablet, TAKE 1 TABLET(875 MG) BY MOUTH TWICE DAILY IN ADDITION TO AMOXICILLIN-CLAVULANATE TABLETS (Patient not taking: Reported on 05/31/2017), Disp: 20 tablet, Rfl: 0 .  amoxicillin-clavulanate (AUGMENTIN) 875-125 MG tablet, TAKE 1 TABLET BY MOUTH TWICE DAILY FOR 10 DAYS (Patient not taking: Reported on 05/31/2017), Disp: 20 tablet, Rfl: 0  Review of Systems  Cardiovascular: Negative for chest pain, palpitations and leg swelling.    Social History   Tobacco Use  . Smoking status: Never Smoker  . Smokeless tobacco: Never Used  Substance Use Topics  . Alcohol use: Yes    Alcohol/week: 0.0 oz    Comment: few glasses a wine/wk.   Objective:   BP 140/70 (BP Location: Right Arm, Patient Position: Sitting, Cuff Size: Normal)   Pulse 77   Temp 97.6 F (36.4 C) (Oral)   Resp 16   Wt 109 lb 12.8 oz (49.8 kg)   SpO2 99%   BMI 18.85 kg/m    Physical Exam  General appearance: alert, well developed, well nourished, cooperative and in no distress Head: Normocephalic, without obvious abnormality, atraumatic Respiratory: Respirations even and unlabored, normal respiratory rate Extremities: No gross deformities Skin: Skin color, texture, turgor normal. No rashes seen  Psych: Appropriate mood and affect. Neurologic: Mental status: Alert, oriented to person, place, and time, thought content appropriate.     Assessment & Plan:.diagm     1. Essential hypertension Improved since doubling dose of carvedilol. New prescription sent for 25mg  tablets. He is going to try stopping clonidine which is likley contributing to labile BP. She is to start checking at home and can take clonidine if B<>160/90.    2. Cough  - guaiFENesin-codeine 100-10 MG/5ML syrup; Take 5-10 mLs by mouth 3 (three) times daily as needed for up to 20 days for cough.  Dispense: 118 mL; Refill: 0       Lelon Huh,  MD  [facility]

## 2017-05-31 NOTE — Patient Instructions (Signed)
   Try stopping clonidine. If you home blood pressure get consistently above 150/90 then restart clonidine.

## 2017-06-06 ENCOUNTER — Inpatient Hospital Stay: Payer: Medicare Other | Attending: Oncology | Admitting: *Deleted

## 2017-06-06 ENCOUNTER — Inpatient Hospital Stay: Payer: Medicare Other

## 2017-06-06 DIAGNOSIS — D631 Anemia in chronic kidney disease: Secondary | ICD-10-CM | POA: Insufficient documentation

## 2017-06-06 DIAGNOSIS — N184 Chronic kidney disease, stage 4 (severe): Secondary | ICD-10-CM

## 2017-06-06 DIAGNOSIS — I10 Essential (primary) hypertension: Secondary | ICD-10-CM | POA: Diagnosis not present

## 2017-06-06 DIAGNOSIS — N189 Chronic kidney disease, unspecified: Secondary | ICD-10-CM | POA: Diagnosis not present

## 2017-06-06 LAB — CBC WITH DIFFERENTIAL/PLATELET
Basophils Absolute: 0.1 10*3/uL (ref 0–0.1)
Basophils Relative: 2 %
EOS ABS: 0.2 10*3/uL (ref 0–0.7)
Eosinophils Relative: 6 %
HEMATOCRIT: 34.2 % — AB (ref 35.0–47.0)
HEMOGLOBIN: 11 g/dL — AB (ref 12.0–16.0)
Lymphocytes Relative: 23 %
Lymphs Abs: 0.9 10*3/uL — ABNORMAL LOW (ref 1.0–3.6)
MCH: 30.7 pg (ref 26.0–34.0)
MCHC: 32.3 g/dL (ref 32.0–36.0)
MCV: 95.2 fL (ref 80.0–100.0)
MONOS PCT: 6 %
Monocytes Absolute: 0.2 10*3/uL (ref 0.2–0.9)
NEUTROS PCT: 63 %
Neutro Abs: 2.3 10*3/uL (ref 1.4–6.5)
Platelets: 206 10*3/uL (ref 150–440)
RBC: 3.59 MIL/uL — ABNORMAL LOW (ref 3.80–5.20)
RDW: 19.4 % — ABNORMAL HIGH (ref 11.5–14.5)
WBC: 3.7 10*3/uL (ref 3.6–11.0)

## 2017-06-06 LAB — SAMPLE TO BLOOD BANK

## 2017-06-08 ENCOUNTER — Other Ambulatory Visit: Payer: Self-pay | Admitting: Family Medicine

## 2017-06-08 DIAGNOSIS — J329 Chronic sinusitis, unspecified: Secondary | ICD-10-CM

## 2017-06-15 ENCOUNTER — Other Ambulatory Visit: Payer: Self-pay | Admitting: Family Medicine

## 2017-06-15 DIAGNOSIS — G4700 Insomnia, unspecified: Secondary | ICD-10-CM

## 2017-06-19 NOTE — Progress Notes (Signed)
Amy [name]  [phone]) [phone] Fax:(336) [phone]  ID: [name] OB: Aug 23, 1950  MR#: [id number]  FWY#:[id number]  Patient Care Team: [name], MD as PCP - General (Family Medicine) Thornton Park, MD as Referring Physician (Orthopedic Surgery) Lloyd Huger, MD as Consulting Physician (Oncology) Minna Merritts, MD as Consulting Physician (Cardiology) Murlean Iba, MD as Referring Physician (Nephrology)  CHIEF COMPLAINT:  Anemia in chronic kidney disease, unspecified stage.  INTERVAL HISTORY: Patient returns to clinic today for repeat laboratory work, further evaluation, and continuation of Procrit.  She currently feels well and is asymptomatic.  She does not complain of weakness or fatigue today.  She has no neurologic complaints. She denies any fevers. She has a good appetite and denies weight loss. She has no chest pain or shortness of breath. She denies any nausea, vomiting, constipation, or diarrhea.  She denies any melena or hematochezia.  She has no urinary complaints. Patient offers no specific complaints today.  REVIEW OF SYSTEMS:   Review of Systems  Constitutional: Negative.  Negative for fever, malaise/fatigue and weight loss.  Respiratory: Negative.  Negative for cough and shortness of breath.   Cardiovascular: Negative.  Negative for chest pain and leg swelling.  Gastrointestinal: Negative.  Negative for abdominal pain, blood in stool and melena.  Genitourinary: Negative.   Musculoskeletal: Negative.   Skin: Negative.  Negative for rash.  Neurological: Negative for weakness.  Psychiatric/Behavioral: The patient is nervous/anxious.     As per HPI. Otherwise, a complete review of systems is negative.  PAST MEDICAL HISTORY: Past Medical History:  Diagnosis Date  . Anemia    a. 08/2016 Guaiac + stool. EGD w/ gastritis.  . Anxiety   . CKD (chronic kidney disease), stage III ([place])   . Colon polyp    a. 08/2016  Colonoscopy.  . Gastritis    a. 08/2016 EGD: Gastritis-->PPI.  [name] GERD (gastroesophageal reflux disease)   . History of chicken pox   . History of measles as a child   . Hypertension   . Hyponatremia    a. 08/2016 in setting of HCTZ Rx.  . Multiple thyroid nodules     PAST SURGICAL HISTORY: Past Surgical History:  Procedure Laterality Date  . ABDOMINAL HYSTERECTOMY  2003   due to heavy periods and clotting during menses  . BREAST LUMPECTOMY Left 1990  . CHOLECYSTECTOMY  2010  . COLONOSCOPY WITH PROPOFOL N/A 09/07/2016   Procedure: COLONOSCOPY WITH PROPOFOL;  Surgeon: Lucilla Lame, MD;  Location: [facility] ENDOSCOPY;  Service: Endoscopy;  Laterality: N/A;  . DILATION AND CURETTAGE OF UTERUS  1985  . ESOPHAGOGASTRODUODENOSCOPY (EGD) WITH PROPOFOL N/A 09/07/2016   Procedure: ESOPHAGOGASTRODUODENOSCOPY (EGD) WITH PROPOFOL;  Surgeon: Lucilla Lame, MD;  Location: [facility] ENDOSCOPY;  Service: Endoscopy;  Laterality: N/A;  . GALLBLADDER SURGERY  2012  . KNEE SURGERY     Dr. Tamala Julian  . MRI, Lumbar spine  07/23/2009   Multilevel annular bulge and disc protrusion at L2-L3, L3-L4, L4-L5, and L5-S1  . TONSILLECTOMY    . TONSILLECTOMY AND ADENOIDECTOMY  1970  . UPPER GI ENDOSCOPY  03/02/2006   H. Pylori negative; Dr. Allen Norris    FAMILY HISTORY: Family History  Problem Relation Age of Onset  . Pancreatic cancer Father   . Congestive Heart Failure Maternal Grandmother   . Cancer Paternal Grandfather   . Heart disease Maternal Uncle     ADVANCED DIRECTIVES (Y/N):  N  HEALTH MAINTENANCE: Social History   Tobacco Use  . Smoking  status: Never Smoker  . Smokeless tobacco: Never Used  Substance Use Topics  . Alcohol use: Yes    Alcohol/week: 0.0 oz    Comment: few glasses a wine/wk.  . Drug use: No     Colonoscopy:  PAP:  Bone density:  Lipid panel:  Allergies  Allergen Reactions  . Cephalosporins   . Clarithromycin   . Lunesta  [Eszopiclone]     Heart racing  . Penicillin V     Has  patient had a PCN reaction causing immediate rash, facial/tongue/throat swelling, SOB or lightheadedness with hypotension: Yes Has patient had a PCN reaction causing severe rash involving mucus membranes or skin necrosis: No Has patient had a PCN reaction that required hospitalization No Has patient had a PCN reaction occurring within the last 10 years: No If all of the above answers are "NO", then may proceed with Cephalosporin use.  . Sulfa Antibiotics     Current Outpatient Medications  Medication Sig Dispense Refill  . amLODipine (NORVASC) 10 MG tablet Take 1 tablet (10 mg total) by mouth daily. 30 tablet 1  . amoxicillin (AMOXIL) 875 MG tablet TAKE 1 TABLET(875 MG) BY MOUTH TWICE DAILY IN ADDITION TO AMOXICILLIN-CLAVULANATE TABLETS 20 tablet 0  . amoxicillin-clavulanate (AUGMENTIN) 875-125 MG tablet TAKE 1 TABLET BY MOUTH TWICE DAILY FOR 10 DAYS 20 tablet 0  . aspirin EC 81 MG EC tablet Take 1 tablet (81 mg total) by mouth daily.    [name] buPROPion (WELLBUTRIN) 75 MG tablet TAKE 2 TABLETS BY MOUTH DAILY 180 tablet 2  . butalbital-acetaminophen-caffeine (FIORICET, ESGIC) 50-325-40 MG tablet TAKE 1 TO 2 TABLETS BY MOUTH EVERY 4-6 HOURS AS NEEDED FOR PAIN. MAX OF 8 PER DAY 150 tablet 4  . carvedilol (COREG) 25 MG tablet Take 1 tablet (25 mg total) by mouth 2 (two) times daily with a meal. 180 tablet 4  . cloNIDine (CATAPRES) 0.1 MG tablet Take 1 tablet (0.1 mg total) by mouth 2 (two) times daily. 60 tablet 1  . fluticasone (FLONASE) 50 MCG/ACT nasal spray Place 1-2 sprays into both nostrils daily. 16 g 5  . furosemide (LASIX) 20 MG tablet furosemide 20 mg tablet  TK 1 T PO QD PRF SWELLING    . guaiFENesin-codeine 100-10 MG/5ML syrup Take 5-10 mLs by mouth 3 (three) times daily as needed for up to 20 days for cough. 118 mL 0  . hydrALAZINE (APRESOLINE) 50 MG tablet Take 0.5 tablets (25 mg total) by mouth 3 (three) times daily. if your Systolic ZL>935 or Diastolic TS>177 3 tablet 1  . irbesartan  (AVAPRO) 300 MG tablet Take 1 tablet (300 mg total) by mouth daily. 30 tablet 1  . LORazepam (ATIVAN) 1 MG tablet Take 1 tablet (1 mg total) 2 (two) times daily as needed by mouth. 180 tablet 0  . pantoprazole (PROTONIX) 40 MG tablet Take 1 tablet (40 mg total) by mouth daily. 30 tablet 5  . ranitidine (ZANTAC) 150 MG tablet Take 150 mg by mouth 2 (two) times daily.    . valACYclovir (VALTREX) 1000 MG tablet 2 tablets twice a day for 1 day as needed for cold sores 20 tablet 1  . zolpidem (AMBIEN) 10 MG tablet TAKE 1 TABLET BY MOUTH AT BEDTIME AS NEEDED 30 tablet 5   No current facility-administered medications for this visit.     OBJECTIVE: Vitals:   06/20/17 1507  BP: (!) 197/81  Pulse: 76  Resp: 16  Temp: 98.4 F (36.9 C)  Body mass index is 19.27 kg/m.    ECOG FS:0 - Asymptomatic  General: Well-developed, well-nourished, no acute distress. Eyes: Pink conjunctiva, anicteric sclera. Lungs: Clear to auscultation bilaterally. Heart: Regular rate and rhythm. No rubs, murmurs, or gallops. Abdomen: Soft, nontender, nondistended. No organomegaly noted, normoactive bowel sounds. Musculoskeletal: No edema, cyanosis, or clubbing. Neuro: Alert, answering all questions appropriately. Cranial nerves grossly intact. Skin: No rashes or petechiae noted. Psych: Normal affect.   LAB RESULTS:  Lab Results  Component Value Date   NA 133 (L) 04/05/2017   K 4.0 04/05/2017   CL 105 04/05/2017   CO2 19 (L) 04/05/2017   GLUCOSE 99 04/05/2017   BUN 29 (H) 04/05/2017   CREATININE 1.93 (H) 04/05/2017   CALCIUM 9.0 04/05/2017   PROT 6.3 (L) 12/28/2016   ALBUMIN 4.3 12/28/2016   AST 19 12/28/2016   ALT 13 (L) 12/28/2016   ALKPHOS 117 12/28/2016   BILITOT 0.5 12/28/2016   GFRNONAA 25 (L) 12/28/2016   GFRAA 29 (L) 12/28/2016    Lab Results  Component Value Date   WBC 4.1 06/20/2017   NEUTROABS 2.8 06/20/2017   HGB 9.7 (L) 06/20/2017   HCT 29.7 (L) 06/20/2017   MCV 95.7 06/20/2017    PLT 165 06/20/2017   Lab Results  Component Value Date   IRON 141 05/09/2017   TIBC 222 (L) 05/09/2017   IRONPCTSAT 63 (H) 05/09/2017   Lab Results  Component Value Date   FERRITIN 79 05/09/2017     STUDIES: No results found.  ASSESSMENT: Anemia in chronic kidney disease, unspecified stage.  PLAN:    1. Anemia in chronic kidney disease, unspecified stage: Patient's iron stores are within normal limits. She had a normal colonoscopy and EGD on Sep 07, 2016. CT of the abdomen and pelvis was essentially within normal limits. Patient has some mild chronic renal insufficiency with an inappropriately low erythropoietin level that is likely contributing. Her reticulocyte count is also inappropriately low for this level of anemia. Remainder of her blood work was either negative or within normal limits.  Patient's hemoglobin is less than 10.0 today, but unfortunately she cannot received Procrit given to her significantly elevated blood pressure.  Return to clinic later this week for her Procrit injection if her blood pressure is controlled.  It appears patient only requires treatment every 4 weeks, therefore she will return to clinic in 4 weeks for laboratory work and Procrit and then in 8 weeks for further evaluation.  If there is no significant improvement in patient's hemoglobin with Procrit, she likely will require a bone marrow biopsy for further evaluation.    2. Chronic renal insufficiency: Patient states she is no longer seeing nephrology. 3.  Hypertension: Patient's blood pressure is significantly elevated today.  Delay Procrit as above.  Approximately 30 minutes was spent in discussion of which greater than 50% was consultation.  Patient expressed understanding and was in agreement with this plan. She also understands that She can call clinic at any time with any questions, concerns, or complaints.   Lloyd Huger, MD   06/20/2017 4:10 PM

## 2017-06-20 ENCOUNTER — Inpatient Hospital Stay (HOSPITAL_BASED_OUTPATIENT_CLINIC_OR_DEPARTMENT_OTHER): Payer: Medicare Other | Admitting: Oncology

## 2017-06-20 ENCOUNTER — Inpatient Hospital Stay: Payer: Medicare Other

## 2017-06-20 VITALS — BP 197/81 | HR 76 | Temp 98.4°F | Resp 16 | Wt 112.2 lb

## 2017-06-20 DIAGNOSIS — D631 Anemia in chronic kidney disease: Secondary | ICD-10-CM | POA: Diagnosis not present

## 2017-06-20 DIAGNOSIS — I10 Essential (primary) hypertension: Secondary | ICD-10-CM | POA: Diagnosis not present

## 2017-06-20 DIAGNOSIS — N184 Chronic kidney disease, stage 4 (severe): Principal | ICD-10-CM

## 2017-06-20 DIAGNOSIS — N183 Chronic kidney disease, stage 3 (moderate): Secondary | ICD-10-CM | POA: Diagnosis not present

## 2017-06-20 DIAGNOSIS — N189 Chronic kidney disease, unspecified: Secondary | ICD-10-CM | POA: Diagnosis not present

## 2017-06-20 LAB — CBC WITH DIFFERENTIAL/PLATELET
Basophils Absolute: 0.1 10*3/uL (ref 0–0.1)
Basophils Relative: 2 %
Eosinophils Absolute: 0.3 10*3/uL (ref 0–0.7)
Eosinophils Relative: 8 %
HEMATOCRIT: 29.7 % — AB (ref 35.0–47.0)
HEMOGLOBIN: 9.7 g/dL — AB (ref 12.0–16.0)
LYMPHS ABS: 0.7 10*3/uL — AB (ref 1.0–3.6)
LYMPHS PCT: 17 %
MCH: 31.1 pg (ref 26.0–34.0)
MCHC: 32.5 g/dL (ref 32.0–36.0)
MCV: 95.7 fL (ref 80.0–100.0)
Monocytes Absolute: 0.2 10*3/uL (ref 0.2–0.9)
Monocytes Relative: 6 %
NEUTROS ABS: 2.8 10*3/uL (ref 1.4–6.5)
NEUTROS PCT: 67 %
Platelets: 165 10*3/uL (ref 150–440)
RBC: 3.1 MIL/uL — ABNORMAL LOW (ref 3.80–5.20)
RDW: 16.5 % — ABNORMAL HIGH (ref 11.5–14.5)
WBC: 4.1 10*3/uL (ref 3.6–11.0)

## 2017-06-20 LAB — SAMPLE TO BLOOD BANK

## 2017-06-20 NOTE — Progress Notes (Signed)
Pt in for follow up and procrit.  Denies any difficulties since last in to clinic,  BP is elevated today.  RN to recheck.

## 2017-06-21 ENCOUNTER — Other Ambulatory Visit: Payer: Self-pay | Admitting: Family Medicine

## 2017-06-23 ENCOUNTER — Inpatient Hospital Stay: Payer: Medicare Other

## 2017-06-23 VITALS — BP 167/76

## 2017-06-23 DIAGNOSIS — I10 Essential (primary) hypertension: Secondary | ICD-10-CM | POA: Diagnosis not present

## 2017-06-23 DIAGNOSIS — D509 Iron deficiency anemia, unspecified: Secondary | ICD-10-CM

## 2017-06-23 DIAGNOSIS — N189 Chronic kidney disease, unspecified: Secondary | ICD-10-CM | POA: Diagnosis not present

## 2017-06-23 DIAGNOSIS — D631 Anemia in chronic kidney disease: Secondary | ICD-10-CM | POA: Diagnosis not present

## 2017-06-23 MED ORDER — EPOETIN ALFA 40000 UNIT/ML IJ SOLN
40000.0000 [IU] | Freq: Once | INTRAMUSCULAR | Status: AC
  Administered 2017-06-23: 40000 [IU] via SUBCUTANEOUS

## 2017-06-23 NOTE — Progress Notes (Unsigned)
MD approves getting Procrit with BP 167/74

## 2017-06-27 ENCOUNTER — Other Ambulatory Visit: Payer: Self-pay | Admitting: Family Medicine

## 2017-06-27 DIAGNOSIS — J329 Chronic sinusitis, unspecified: Secondary | ICD-10-CM

## 2017-07-07 ENCOUNTER — Other Ambulatory Visit: Payer: Self-pay | Admitting: Family Medicine

## 2017-07-07 DIAGNOSIS — J329 Chronic sinusitis, unspecified: Secondary | ICD-10-CM

## 2017-07-08 MED ORDER — AMOXICILLIN 875 MG PO TABS: ORAL_TABLET | ORAL | 0 refills | Status: DC

## 2017-07-18 ENCOUNTER — Inpatient Hospital Stay: Payer: Medicare Other | Attending: Oncology

## 2017-07-18 ENCOUNTER — Inpatient Hospital Stay: Payer: Medicare Other

## 2017-07-18 VITALS — BP 149/82 | HR 66

## 2017-07-18 DIAGNOSIS — I129 Hypertensive chronic kidney disease with stage 1 through stage 4 chronic kidney disease, or unspecified chronic kidney disease: Secondary | ICD-10-CM | POA: Diagnosis not present

## 2017-07-18 DIAGNOSIS — D631 Anemia in chronic kidney disease: Secondary | ICD-10-CM | POA: Insufficient documentation

## 2017-07-18 DIAGNOSIS — D509 Iron deficiency anemia, unspecified: Secondary | ICD-10-CM

## 2017-07-18 DIAGNOSIS — N183 Chronic kidney disease, stage 3 (moderate): Secondary | ICD-10-CM | POA: Insufficient documentation

## 2017-07-18 DIAGNOSIS — N184 Chronic kidney disease, stage 4 (severe): Secondary | ICD-10-CM

## 2017-07-18 LAB — CBC WITH DIFFERENTIAL/PLATELET
BASOS ABS: 0 10*3/uL (ref 0–0.1)
Basophils Relative: 2 %
EOS ABS: 0.2 10*3/uL (ref 0–0.7)
EOS PCT: 6 %
HCT: 27.7 % — ABNORMAL LOW (ref 35.0–47.0)
Hemoglobin: 9.3 g/dL — ABNORMAL LOW (ref 12.0–16.0)
LYMPHS PCT: 20 %
Lymphs Abs: 0.6 10*3/uL — ABNORMAL LOW (ref 1.0–3.6)
MCH: 31.8 pg (ref 26.0–34.0)
MCHC: 33.5 g/dL (ref 32.0–36.0)
MCV: 95.1 fL (ref 80.0–100.0)
MONO ABS: 0.2 10*3/uL (ref 0.2–0.9)
Monocytes Relative: 7 %
Neutro Abs: 2.1 10*3/uL (ref 1.4–6.5)
Neutrophils Relative %: 65 %
PLATELETS: 152 10*3/uL (ref 150–440)
RBC: 2.91 MIL/uL — AB (ref 3.80–5.20)
RDW: 15.5 % — AB (ref 11.5–14.5)
WBC: 3.2 10*3/uL — AB (ref 3.6–11.0)

## 2017-07-18 LAB — SAMPLE TO BLOOD BANK

## 2017-07-18 MED ORDER — EPOETIN ALFA 40000 UNIT/ML IJ SOLN
40000.0000 [IU] | Freq: Once | INTRAMUSCULAR | Status: AC
  Administered 2017-07-18: 40000 [IU] via SUBCUTANEOUS

## 2017-07-19 ENCOUNTER — Other Ambulatory Visit: Payer: Self-pay | Admitting: Family Medicine

## 2017-07-19 DIAGNOSIS — J329 Chronic sinusitis, unspecified: Secondary | ICD-10-CM

## 2017-07-22 ENCOUNTER — Ambulatory Visit (INDEPENDENT_AMBULATORY_CARE_PROVIDER_SITE_OTHER): Payer: Medicare Other | Admitting: Family Medicine

## 2017-07-22 ENCOUNTER — Encounter: Payer: Self-pay | Admitting: Family Medicine

## 2017-07-22 ENCOUNTER — Other Ambulatory Visit: Payer: Self-pay | Admitting: Family Medicine

## 2017-07-22 VITALS — BP 142/78 | HR 76 | Temp 97.9°F | Resp 16 | Ht 64.0 in | Wt 111.0 lb

## 2017-07-22 DIAGNOSIS — N184 Chronic kidney disease, stage 4 (severe): Secondary | ICD-10-CM

## 2017-07-22 DIAGNOSIS — J329 Chronic sinusitis, unspecified: Secondary | ICD-10-CM | POA: Diagnosis not present

## 2017-07-22 DIAGNOSIS — I10 Essential (primary) hypertension: Secondary | ICD-10-CM

## 2017-07-22 DIAGNOSIS — J302 Other seasonal allergic rhinitis: Secondary | ICD-10-CM | POA: Diagnosis not present

## 2017-07-22 MED ORDER — AZELASTINE HCL 0.1 % NA SOLN: 2.0000 | Freq: Two times a day (BID) | NASAL | 12 refills | Status: DC

## 2017-07-22 MED ORDER — AMLODIPINE BESYLATE 5 MG PO TABS: 5.0000 mg | ORAL_TABLET | Freq: Every day | ORAL | 1 refills | Status: DC

## 2017-07-22 MED ORDER — AMOXICILLIN 875 MG PO TABS: ORAL_TABLET | ORAL | 0 refills | Status: DC

## 2017-07-22 MED ORDER — AMOXICILLIN-POT CLAVULANATE 875-125 MG PO TABS: 1.0000 | ORAL_TABLET | Freq: Two times a day (BID) | ORAL | 0 refills | Status: DC

## 2017-07-22 NOTE — Patient Instructions (Signed)
   You can take Mucinex-DM for your cough

## 2017-07-22 NOTE — Progress Notes (Signed)
Patient: Amy [name] Female    DOB: May 02, 1951   67 y.o.   MRN: [id number] Visit Date: 07/22/2017  Today's Provider: Lelon Huh, MD   Chief Complaint  Patient presents with  . Sinusitis   Subjective:    Sinusitis  This is a chronic problem. The current episode started in the past 7 days. The problem has been gradually worsening since onset. There has been no fever. Associated symptoms include congestion, coughing, headaches, a hoarse voice and sinus pressure. Pertinent negatives include no chills or shortness of breath. Past treatments include nothing.   Follow up hypertension.  BP Readings from Last 5 Encounters:  07/22/17 (!) 142/78  07/18/17 (!) 149/82  06/23/17 (!) 167/76  06/20/17 (!) 197/81  06/06/17 (!) 167/76   We had her double dose of carvedilol in January and was advised to only take clonidine prn. However she states that doesn't check her BP at home so she just takes the clonidine. She had also been prescribed amlodipine, but states she hasn't had this refilled for a few months since it was initially prescribed from the hospital.      Allergies  Allergen Reactions  . Cephalosporins   . Clarithromycin   . Lunesta  [Eszopiclone]     Heart racing  . Penicillin V     Has patient had a PCN reaction causing immediate rash, facial/tongue/throat swelling, SOB or lightheadedness with hypotension: Yes Has patient had a PCN reaction causing severe rash involving mucus membranes or skin necrosis: No Has patient had a PCN reaction that required hospitalization No Has patient had a PCN reaction occurring within the last 10 years: No If all of the above answers are "NO", then may proceed with Cephalosporin use.  . Sulfa Antibiotics      Current Outpatient Medications:  .  amLODipine (NORVASC) 10 MG tablet, Take 1 tablet (10 mg total) by mouth daily., Disp: 30 tablet, Rfl: 1 (NOT TAKING) .  aspirin EC 81 MG EC tablet, Take 1 tablet (81 mg total) by mouth daily.,  Disp: , Rfl:  .  buPROPion (WELLBUTRIN) 75 MG tablet, TAKE 2 TABLETS BY MOUTH DAILY, Disp: 180 tablet, Rfl: 2 .  butalbital-acetaminophen-caffeine (FIORICET, ESGIC) 50-325-40 MG tablet, TAKE 1 TO 2 TABLETS BY MOUTH EVERY 4-6 HOURS AS NEEDED FOR PAIN. MAX OF 8 PER DAY, Disp: 150 tablet, Rfl: 4 .  carvedilol (COREG) 25 MG tablet, TAKE 1 TABLET(25 MG) BY MOUTH TWICE DAILY WITH A MEAL, Disp: 60 tablet, Rfl: 5 .  cloNIDine (CATAPRES) 0.1 MG tablet, TAKE 1 TABLET(0.1 MG) BY MOUTH TWICE DAILY, Disp: 60 tablet, Rfl: 0 .  fluticasone (FLONASE) 50 MCG/ACT nasal spray, Place 1-2 sprays into both nostrils daily., Disp: 16 g, Rfl: 5 .  furosemide (LASIX) 20 MG tablet, furosemide 20 mg tablet  TK 1 T PO QD PRF SWELLING, Disp: , Rfl:  .  hydrALAZINE (APRESOLINE) 50 MG tablet, Take 0.5 tablets (25 mg total) by mouth 3 (three) times daily. if your Systolic SR>159 or Diastolic YV>859, Disp: 3 tablet, Rfl: 1 .  irbesartan (AVAPRO) 300 MG tablet, Take 1 tablet (300 mg total) by mouth daily., Disp: 30 tablet, Rfl: 1 .  LORazepam (ATIVAN) 1 MG tablet, TAKE 1 TABLET BY MOUTH TWICE DAILY AS NEEDED, Disp: 180 tablet, Rfl: 5 .  pantoprazole (PROTONIX) 40 MG tablet, Take 1 tablet (40 mg total) by mouth daily., Disp: 30 tablet, Rfl: 5 .  ranitidine (ZANTAC) 150 MG tablet, Take 150 mg by  mouth 2 (two) times daily., Disp: , Rfl:  .  valACYclovir (VALTREX) 1000 MG tablet, 2 tablets twice a day for 1 day as needed for cold sores, Disp: 20 tablet, Rfl: 1 .  zolpidem (AMBIEN) 10 MG tablet, TAKE 1 TABLET BY MOUTH AT BEDTIME AS NEEDED, Disp: 30 tablet, Rfl: 5   Review of Systems  Constitutional: Negative for appetite change, chills, fatigue and fever.  HENT: Positive for congestion, hoarse voice and sinus pressure.   Respiratory: Positive for cough. Negative for chest tightness and shortness of breath.   Cardiovascular: Negative for chest pain and palpitations.  Gastrointestinal: Negative for abdominal pain, nausea and vomiting.    Neurological: Positive for headaches. Negative for dizziness and weakness.    Social History   Tobacco Use  . Smoking status: Never Smoker  . Smokeless tobacco: Never Used  Substance Use Topics  . Alcohol use: Yes    Alcohol/week: 0.0 oz    Comment: few glasses a wine/wk.   Objective:   BP (!) 142/78 (BP Location: Right Arm, Patient Position: Sitting, Cuff Size: Normal)   Pulse 76   Temp 97.9 F (36.6 C)   Resp 16   Ht 5\' 4"  (1.626 m)   Wt 111 lb (50.3 kg)   BMI 19.05 kg/m  Vitals:   07/22/17 1057  BP: (!) 142/78  Pulse: 76  Resp: 16  Temp: 97.9 F (36.6 C)  Weight: 111 lb (50.3 kg)  Height: 5\' 4"  (1.626 m)     Physical Exam  General Appearance:    Alert, cooperative, no distress  HENT:   bilateral TM normal without fluid or infection, pharynx erythematous without exudate, frontal and maxillary  sinus tender and nasal mucosa pale and congested  Eyes:    PERRL, conjunctiva/corneas clear, EOM's intact       Lungs:     Clear to auscultation bilaterally, respirations unlabored  Heart:    Regular rate and rhythm  Neurologic:   Awake, alert, oriented x 3. No apparent focal neurological           defect.           Assessment & Plan:     1. Recurrent sinus infections  - amoxicillin-clavulanate (AUGMENTIN) 875-125 MG tablet; Take 1 tablet by mouth 2 (two) times daily. for 10 days  Dispense: 20 tablet; Refill: 0 - amoxicillin (AMOXIL) 875 MG tablet; Take 1 tablet by mouth twice daily in addition to amoxicillin-clavulanate tablets  Dispense: 20 tablet; Refill: 0  2. Essential hypertension Better, although she has not been taking amlodipine. Will restart at 5 mg a day. Hope to get off clonidine if BP improved once she is back on amlodipine.  - Renal function panel  3. CKD (chronic kidney disease) stage 4, GFR 15-29 ml/min ([facility]) Will see if hematology can recheck renal panel at next blood draw  4. Seasonal allergic rhinitis, unspecified trigger Doesn't seem to be  controlled with fluticasone.  - azelastine (ASTELIN) 0.1 % nasal spray; Place 2 sprays into both nostrils 2 (two) times daily. Use in each nostril as directed  Dispense: 30 mL; Refill: 12       Lelon Huh, MD  [facility]

## 2017-07-25 ENCOUNTER — Other Ambulatory Visit: Payer: Self-pay | Admitting: Family Medicine

## 2017-07-27 ENCOUNTER — Other Ambulatory Visit: Payer: Self-pay | Admitting: Family Medicine

## 2017-07-27 DIAGNOSIS — R51 Headache: Principal | ICD-10-CM

## 2017-07-27 DIAGNOSIS — G8929 Other chronic pain: Secondary | ICD-10-CM

## 2017-08-03 ENCOUNTER — Other Ambulatory Visit: Payer: Self-pay | Admitting: Family Medicine

## 2017-08-03 DIAGNOSIS — J329 Chronic sinusitis, unspecified: Secondary | ICD-10-CM

## 2017-08-07 NOTE — Progress Notes (Signed)
[place]  Telephone:([phone]) [phone] Fax:(336) [phone]  ID: Amy [name] OB: October 26, 1950  MR#: [id number]  RAX#:[id number]  Patient Care Team: [name], MD as PCP - General (Family Medicine) Thornton Park, MD as Referring Physician (Orthopedic Surgery) Lloyd Huger, MD as Consulting Physician (Oncology) Minna Merritts, MD as Consulting Physician (Cardiology) Murlean Iba, MD as Referring Physician (Nephrology)  CHIEF COMPLAINT:  Anemia in stage IV chronic kidney disease.  INTERVAL HISTORY: Patient returns to clinic today for repeat laboratory work, further evaluation, and continuation of Procrit.  She continues to feel well and remains asymptomatic.  She denies any weakness or fatigue.  She has no neurologic complaints.  She denies any recent fevers or illnesses.  She has a good appetite and her weight has remained stable.  She has no chest pain or shortness of breath. She denies any nausea, vomiting, constipation, or diarrhea.  She denies any melena or hematochezia.  She has no urinary complaints. Patient offers no specific complaints today.  REVIEW OF SYSTEMS:   Review of Systems  Constitutional: Negative.  Negative for fever, malaise/fatigue and weight loss.  Respiratory: Negative.  Negative for cough and shortness of breath.   Cardiovascular: Negative.  Negative for chest pain and leg swelling.  Gastrointestinal: Negative.  Negative for abdominal pain, blood in stool and melena.  Genitourinary: Negative.  Negative for dysuria and hematuria.  Musculoskeletal: Negative.  Negative for back pain.  Skin: Negative.  Negative for rash.  Neurological: Negative for sensory change, focal weakness and weakness.  Psychiatric/Behavioral: Negative.  The patient is not nervous/anxious.     As per HPI. Otherwise, a complete review of systems is negative.  PAST MEDICAL HISTORY: Past Medical History:  Diagnosis Date  . Anemia    a. 08/2016 Guaiac +  stool. EGD w/ gastritis.  . Anxiety   . CKD (chronic kidney disease), stage III ([place])   . Colon polyp    a. 08/2016 Colonoscopy.  . Gastritis    a. 08/2016 EGD: Gastritis-->PPI.  [name] GERD (gastroesophageal reflux disease)   . History of chicken pox   . History of measles as a child   . Hypertension   . Hyponatremia    a. 08/2016 in setting of HCTZ Rx.  . Multiple thyroid nodules     PAST SURGICAL HISTORY: Past Surgical History:  Procedure Laterality Date  . ABDOMINAL HYSTERECTOMY  2003   due to heavy periods and clotting during menses  . BREAST LUMPECTOMY Left 1990  . CHOLECYSTECTOMY  2010  . COLONOSCOPY WITH PROPOFOL N/A 09/07/2016   Procedure: COLONOSCOPY WITH PROPOFOL;  Surgeon: Lucilla Lame, MD;  Location: [facility] ENDOSCOPY;  Service: Endoscopy;  Laterality: N/A;  . DILATION AND CURETTAGE OF UTERUS  1985  . ESOPHAGOGASTRODUODENOSCOPY (EGD) WITH PROPOFOL N/A 09/07/2016   Procedure: ESOPHAGOGASTRODUODENOSCOPY (EGD) WITH PROPOFOL;  Surgeon: Lucilla Lame, MD;  Location[facility] ENDOSCOPY;  Service: Endoscopy;  Laterality: N/A;  . GALLBLADDER SURGERY  2012  . KNEE SURGERY     Dr. Tamala Julian  . MRI, Lumbar spine  07/23/2009   Multilevel annular bulge and disc protrusion at L2-L3, L3-L4, L4-L5, and L5-S1  . TONSILLECTOMY    . TONSILLECTOMY AND ADENOIDECTOMY  1970  . UPPER GI ENDOSCOPY  03/02/2006   H. Pylori negative; Dr. Allen Norris    FAMILY HISTORY: Family History  Problem Relation Age of Onset  . Pancreatic cancer Father   . Congestive Heart Failure Maternal Grandmother   . Cancer Paternal Grandfather   . Heart disease  Maternal Uncle     ADVANCED DIRECTIVES (Y/N):  N  HEALTH MAINTENANCE: Social History   Tobacco Use  . Smoking status: Never Smoker  . Smokeless tobacco: Never Used  Substance Use Topics  . Alcohol use: Yes    Alcohol/week: 0.0 oz    Comment: few glasses a wine/wk.  . Drug use: No     Colonoscopy:  PAP:  Bone density:  Lipid panel:  Allergies  Allergen  Reactions  . Cephalosporins   . Clarithromycin   . Lunesta  [Eszopiclone]     Heart racing  . Penicillin V     Has patient had a PCN reaction causing immediate rash, facial/tongue/throat swelling, SOB or lightheadedness with hypotension: Yes Has patient had a PCN reaction causing severe rash involving mucus membranes or skin necrosis: No Has patient had a PCN reaction that required hospitalization No Has patient had a PCN reaction occurring within the last 10 years: No If all of the above answers are "NO", then may proceed with Cephalosporin use.  . Sulfa Antibiotics     Current Outpatient Medications  Medication Sig Dispense Refill  . amLODipine (NORVASC) 5 MG tablet TAKE 1 TABLET(5 MG) BY MOUTH DAILY 90 tablet 0  . amoxicillin (AMOXIL) 875 MG tablet TAKE 1 TABLET BY MOUTH TWICE DAILY IN ADDITION TO AMOXICILLIN-CLAVULANATE TABLETS 20 tablet 0  . aspirin EC 81 MG EC tablet Take 1 tablet (81 mg total) by mouth daily.    [name] azelastine (ASTELIN) 0.1 % nasal spray Place 2 sprays into both nostrils 2 (two) times daily. Use in each nostril as directed 30 mL 12  . buPROPion (WELLBUTRIN) 75 MG tablet Take 1 tablet (75 mg total) by mouth 2 (two) times daily. 180 tablet 4  . butalbital-acetaminophen-caffeine (FIORICET, ESGIC) 50-325-40 MG tablet TAKE 1 TO 2 TABLETS BY MOUTH EVERY 4 TO 6 HOURS AS NEEDED FOR PAIN. MAX OF 8 PER DAY 150 tablet 0  . carvedilol (COREG) 25 MG tablet TAKE 1 TABLET(25 MG) BY MOUTH TWICE DAILY WITH A MEAL 60 tablet 5  . cloNIDine (CATAPRES) 0.1 MG tablet TAKE 1 TABLET(0.1 MG) BY MOUTH TWICE DAILY 60 tablet 1  . fluticasone (FLONASE) 50 MCG/ACT nasal spray Place 1-2 sprays into both nostrils daily. 16 g 5  . furosemide (LASIX) 20 MG tablet furosemide 20 mg tablet  TK 1 T PO QD PRF SWELLING    . hydrALAZINE (APRESOLINE) 50 MG tablet Take 0.5 tablets (25 mg total) by mouth 3 (three) times daily. if your Systolic IE>332 or Diastolic RJ>188 3 tablet 1  . LORazepam (ATIVAN) 1 MG  tablet TAKE 1 TABLET BY MOUTH TWICE DAILY AS NEEDED 180 tablet 5  . pantoprazole (PROTONIX) 40 MG tablet Take 1 tablet (40 mg total) by mouth daily. 30 tablet 5  . ranitidine (ZANTAC) 150 MG tablet Take 150 mg by mouth 2 (two) times daily.    . valACYclovir (VALTREX) 1000 MG tablet 2 tablets twice a day for 1 day as needed for cold sores 20 tablet 1  . zolpidem (AMBIEN) 10 MG tablet TAKE 1 TABLET BY MOUTH AT BEDTIME AS NEEDED 30 tablet 5  . doxazosin (CARDURA) 1 MG tablet TAKE 1/2 TABLET BY MOUTH DAILY FOR 6 DAYS. INCREASE TO 1 TABLET BY MOUTH DAILY 90 tablet 0   No current facility-administered medications for this visit.     OBJECTIVE: Vitals:   08/11/17 1436 08/11/17 1442  BP:  (!) 148/80  Pulse:  79  Resp: 12   Temp:  [name])  97.4 F (36.3 C)     Body mass index is 18.78 kg/m.    ECOG FS:0 - Asymptomatic  General: Well-developed, well-nourished, no acute distress. Eyes: Pink conjunctiva, anicteric sclera. Lungs: Clear to auscultation bilaterally. Heart: Regular rate and rhythm. No rubs, murmurs, or gallops. Abdomen: Soft, nontender, nondistended. No organomegaly noted, normoactive bowel sounds. Musculoskeletal: No edema, cyanosis, or clubbing. Neuro: Alert, answering all questions appropriately. Cranial nerves grossly intact. Skin: No rashes or petechiae noted. Psych: Normal affect.   LAB RESULTS:  Lab Results  Component Value Date   NA 133 (L) 08/11/2017   K 4.0 08/11/2017   CL 106 08/11/2017   CO2 17 (L) 08/11/2017   GLUCOSE 115 (H) 08/11/2017   BUN 57 (H) 08/11/2017   CREATININE 2.89 (H) 08/11/2017   CALCIUM 9.0 08/11/2017   PROT 6.3 (L) 12/28/2016   ALBUMIN 4.3 12/28/2016   AST 19 12/28/2016   ALT 13 (L) 12/28/2016   ALKPHOS 117 12/28/2016   BILITOT 0.5 12/28/2016   GFRNONAA 16 (L) 08/11/2017   GFRAA 18 (L) 08/11/2017    Lab Results  Component Value Date   WBC 6.0 08/11/2017   NEUTROABS 4.4 08/11/2017   HGB 9.7 (L) 08/11/2017   HCT 28.5 (L) 08/11/2017    MCV 93.8 08/11/2017   PLT 207 08/11/2017   Lab Results  Component Value Date   IRON 141 05/09/2017   TIBC 222 (L) 05/09/2017   IRONPCTSAT 63 (H) 05/09/2017   Lab Results  Component Value Date   FERRITIN 79 05/09/2017     STUDIES: No results found.  ASSESSMENT: Anemia in stage IV chronic kidney disease.  PLAN:    1. Anemia in stage IV chronic kidney disease: Patient's most recent iron stores from May 09, 2017 are within normal limits.  She had a normal colonoscopy and EGD on Sep 07, 2016. CT of the abdomen and pelvis was essentially within normal limits.  Patient has chronic renal insufficiency which is likely contributing to her anemia.  Previously, her erythropoietin level was inappropriately low.  Her reticulocyte count was also low, possibly indicating an underlying bone marrow disorder.  After discussion with the patient, will hold on doing a bone marrow biopsy at this time the patient expressed understanding that it might be necessary in the future.  Patient's hemoglobin is less than 10.0 today, therefore proceed with 40,000 units of Procrit.  Return to clinic every 3 weeks for laboratory work and consideration of Procrit if her hemoglobin is below 10.0 and her blood pressure is controlled.  Return to clinic in approximately 4 months for further evaluation.   2. Chronic renal insufficiency: Creatinine slightly worse today.  Patient reports she has an appointment with nephrology later this week. 3.  Hypertension: Patient's blood pressure is better controlled today.  Proceed with Procrit as above.  Approximately 30 minutes was spent in discussion of which greater than 50% was consultation.  Patient expressed understanding and was in agreement with this plan. She also understands that She can call clinic at any time with any questions, concerns, or complaints.   Lloyd Huger, MD   08/14/2017 8:34 AM

## 2017-08-09 ENCOUNTER — Other Ambulatory Visit: Payer: Self-pay | Admitting: Family Medicine

## 2017-08-10 ENCOUNTER — Other Ambulatory Visit: Payer: Self-pay | Admitting: Oncology

## 2017-08-10 DIAGNOSIS — N189 Chronic kidney disease, unspecified: Secondary | ICD-10-CM

## 2017-08-11 ENCOUNTER — Other Ambulatory Visit: Payer: Self-pay

## 2017-08-11 ENCOUNTER — Telehealth: Payer: Self-pay | Admitting: Family Medicine

## 2017-08-11 ENCOUNTER — Inpatient Hospital Stay: Payer: Medicare Other

## 2017-08-11 ENCOUNTER — Inpatient Hospital Stay (HOSPITAL_BASED_OUTPATIENT_CLINIC_OR_DEPARTMENT_OTHER): Payer: Medicare Other | Admitting: Oncology

## 2017-08-11 ENCOUNTER — Encounter: Payer: Self-pay | Admitting: Oncology

## 2017-08-11 ENCOUNTER — Ambulatory Visit: Payer: Medicare Other | Admitting: Family Medicine

## 2017-08-11 ENCOUNTER — Inpatient Hospital Stay: Payer: Medicare Other | Attending: Oncology

## 2017-08-11 VITALS — BP 148/80 | HR 79 | Temp 97.4°F | Resp 12 | Ht 64.0 in | Wt 109.4 lb

## 2017-08-11 DIAGNOSIS — I10 Essential (primary) hypertension: Secondary | ICD-10-CM

## 2017-08-11 DIAGNOSIS — D631 Anemia in chronic kidney disease: Secondary | ICD-10-CM

## 2017-08-11 DIAGNOSIS — D509 Iron deficiency anemia, unspecified: Secondary | ICD-10-CM

## 2017-08-11 DIAGNOSIS — R1013 Epigastric pain: Secondary | ICD-10-CM

## 2017-08-11 DIAGNOSIS — N184 Chronic kidney disease, stage 4 (severe): Secondary | ICD-10-CM | POA: Insufficient documentation

## 2017-08-11 DIAGNOSIS — N189 Chronic kidney disease, unspecified: Secondary | ICD-10-CM

## 2017-08-11 DIAGNOSIS — G8929 Other chronic pain: Secondary | ICD-10-CM

## 2017-08-11 DIAGNOSIS — R51 Headache: Secondary | ICD-10-CM

## 2017-08-11 LAB — CBC WITH DIFFERENTIAL/PLATELET
Basophils Absolute: 0.1 10*3/uL (ref 0–0.1)
Basophils Relative: 1 %
EOS PCT: 5 %
Eosinophils Absolute: 0.3 10*3/uL (ref 0–0.7)
HEMATOCRIT: 28.5 % — AB (ref 35.0–47.0)
Hemoglobin: 9.7 g/dL — ABNORMAL LOW (ref 12.0–16.0)
LYMPHS PCT: 14 %
Lymphs Abs: 0.8 10*3/uL — ABNORMAL LOW (ref 1.0–3.6)
MCH: 31.9 pg (ref 26.0–34.0)
MCHC: 34 g/dL (ref 32.0–36.0)
MCV: 93.8 fL (ref 80.0–100.0)
MONO ABS: 0.4 10*3/uL (ref 0.2–0.9)
MONOS PCT: 7 %
NEUTROS ABS: 4.4 10*3/uL (ref 1.4–6.5)
Neutrophils Relative %: 73 %
PLATELETS: 207 10*3/uL (ref 150–440)
RBC: 3.04 MIL/uL — ABNORMAL LOW (ref 3.80–5.20)
RDW: 14.7 % — AB (ref 11.5–14.5)
WBC: 6 10*3/uL (ref 3.6–11.0)

## 2017-08-11 LAB — BASIC METABOLIC PANEL
Anion gap: 10 (ref 5–15)
BUN: 57 mg/dL — AB (ref 6–20)
CALCIUM: 9 mg/dL (ref 8.9–10.3)
CO2: 17 mmol/L — AB (ref 22–32)
Chloride: 106 mmol/L (ref 101–111)
Creatinine, Ser: 2.89 mg/dL — ABNORMAL HIGH (ref 0.44–1.00)
GFR calc Af Amer: 18 mL/min — ABNORMAL LOW (ref >60)
GFR calc non Af Amer: 16 mL/min — ABNORMAL LOW (ref >60)
GLUCOSE: 115 mg/dL — AB (ref 65–99)
POTASSIUM: 4 mmol/L (ref 3.5–5.1)
Sodium: 133 mmol/L — ABNORMAL LOW (ref 135–145)

## 2017-08-11 LAB — SAMPLE TO BLOOD BANK

## 2017-08-11 MED ORDER — EPOETIN ALFA 40000 UNIT/ML IJ SOLN
40000.0000 [IU] | Freq: Once | INTRAMUSCULAR | Status: AC
  Administered 2017-08-11: 40000 [IU] via SUBCUTANEOUS
  Filled 2017-08-11: qty 1

## 2017-08-11 MED ORDER — PANTOPRAZOLE SODIUM 40 MG PO TBEC: 40.0000 mg | DELAYED_RELEASE_TABLET | Freq: Every day | ORAL | 5 refills | Status: DC

## 2017-08-11 MED ORDER — BUTALBITAL-APAP-CAFFEINE 50-325-40 MG PO TABS: ORAL_TABLET | ORAL | 0 refills | Status: DC

## 2017-08-11 NOTE — Progress Notes (Deleted)
Patient: Amy [name] Female    DOB: Jun 16, 1950   67 y.o.   MRN: [id number] Visit Date: 08/11/2017  Today's Provider: Lelon Huh, MD   No chief complaint on file.  Subjective:    HPI     Allergies  Allergen Reactions  . Cephalosporins   . Clarithromycin   . Lunesta  [Eszopiclone]     Heart racing  . Penicillin V     Has patient had a PCN reaction causing immediate rash, facial/tongue/throat swelling, SOB or lightheadedness with hypotension: Yes Has patient had a PCN reaction causing severe rash involving mucus membranes or skin necrosis: No Has patient had a PCN reaction that required hospitalization No Has patient had a PCN reaction occurring within the last 10 years: No If all of the above answers are "NO", then may proceed with Cephalosporin use.  . Sulfa Antibiotics      Current Outpatient Medications:  .  amLODipine (NORVASC) 5 MG tablet, TAKE 1 TABLET(5 MG) BY MOUTH DAILY (Patient not taking: Reported on 07/22/2017), Disp: 90 tablet, Rfl: 0 .  amoxicillin (AMOXIL) 875 MG tablet, TAKE 1 TABLET BY MOUTH TWICE DAILY IN ADDITION TO AMOXICILLIN-CLAVULANATE TABLETS, Disp: 20 tablet, Rfl: 0 .  amoxicillin-clavulanate (AUGMENTIN) 875-125 MG tablet, TAKE 1 TABLET BY MOUTH TWICE DAILY FOR 10 DAYS, Disp: 20 tablet, Rfl: 0 .  aspirin EC 81 MG EC tablet, Take 1 tablet (81 mg total) by mouth daily., Disp: , Rfl:  .  azelastine (ASTELIN) 0.1 % nasal spray, Place 2 sprays into both nostrils 2 (two) times daily. Use in each nostril as directed, Disp: 30 mL, Rfl: 12 .  buPROPion (WELLBUTRIN) 75 MG tablet, Take 1 tablet (75 mg total) by mouth 2 (two) times daily., Disp: 180 tablet, Rfl: 4 .  butalbital-acetaminophen-caffeine (FIORICET, ESGIC) 50-325-40 MG tablet, TAKE 1 TO 2 TABLETS BY MOUTH EVERY 4 TO 6 HOURS AS NEEDED FOR PAIN. MAX OF 8 PER DAY, Disp: 150 tablet, Rfl: 0 .  carvedilol (COREG) 25 MG tablet, TAKE 1 TABLET(25 MG) BY MOUTH TWICE DAILY WITH A MEAL, Disp: 60 tablet,  Rfl: 5 .  cloNIDine (CATAPRES) 0.1 MG tablet, TAKE 1 TABLET(0.1 MG) BY MOUTH TWICE DAILY, Disp: 60 tablet, Rfl: 1 .  fluticasone (FLONASE) 50 MCG/ACT nasal spray, Place 1-2 sprays into both nostrils daily., Disp: 16 g, Rfl: 5 .  furosemide (LASIX) 20 MG tablet, furosemide 20 mg tablet  TK 1 T PO QD PRF SWELLING, Disp: , Rfl:  .  hydrALAZINE (APRESOLINE) 50 MG tablet, Take 0.5 tablets (25 mg total) by mouth 3 (three) times daily. if your Systolic CV>893 or Diastolic YB>017, Disp: 3 tablet, Rfl: 1 .  irbesartan (AVAPRO) 300 MG tablet, Take 1 tablet (300 mg total) by mouth daily., Disp: 30 tablet, Rfl: 1 .  LORazepam (ATIVAN) 1 MG tablet, TAKE 1 TABLET BY MOUTH TWICE DAILY AS NEEDED, Disp: 180 tablet, Rfl: 5 .  pantoprazole (PROTONIX) 40 MG tablet, Take 1 tablet (40 mg total) by mouth daily., Disp: 30 tablet, Rfl: 5 .  ranitidine (ZANTAC) 150 MG tablet, Take 150 mg by mouth 2 (two) times daily., Disp: , Rfl:  .  valACYclovir (VALTREX) 1000 MG tablet, 2 tablets twice a day for 1 day as needed for cold sores, Disp: 20 tablet, Rfl: 1 .  zolpidem (AMBIEN) 10 MG tablet, TAKE 1 TABLET BY MOUTH AT BEDTIME AS NEEDED, Disp: 30 tablet, Rfl: 5  Review of Systems  Constitutional: Negative for appetite change, chills,  fatigue and fever.  Respiratory: Negative for chest tightness and shortness of breath.   Cardiovascular: Negative for chest pain and palpitations.  Gastrointestinal: Negative for abdominal pain, nausea and vomiting.  Neurological: Negative for dizziness and weakness.    Social History   Tobacco Use  . Smoking status: Never Smoker  . Smokeless tobacco: Never Used  Substance Use Topics  . Alcohol use: Yes    Alcohol/week: 0.0 oz    Comment: few glasses a wine/wk.   Objective:   There were no vitals taken for this visit. There were no vitals filed for this visit.   Physical Exam      Assessment & Plan:           Lelon Huh, MD  [facility]

## 2017-08-11 NOTE — Telephone Encounter (Signed)
Please advise patient that we got lab results showing kidney functions have worsened again.  She is going to need to stop the irbesartan.  Start doxazosin to take in its place 1mg  tablet, 1/2 tablet daily for Six days then increase to 1 tablet daily, #30, rf x 0.  Call if any blood pressures >170, otherwise follow up on 4-29 as scheduled.

## 2017-08-11 NOTE — Telephone Encounter (Signed)
Patient missed her appointment today. She states her watch was off by 15 minutes and she didn't realize what time it was. Patient has an appointment scheduled for 08/29/2017. Patient was going to come in today to have there prescriptions refilled. Patient wants to know if Dr. Caryn Section could refill these prescriptions.

## 2017-08-11 NOTE — Progress Notes (Signed)
Patient here for follow up. No changes since last appt. 

## 2017-08-12 ENCOUNTER — Other Ambulatory Visit: Payer: Self-pay | Admitting: Family Medicine

## 2017-08-12 MED ORDER — DOXAZOSIN MESYLATE 1 MG PO TABS: ORAL_TABLET | ORAL | 0 refills | Status: DC

## 2017-08-12 NOTE — Telephone Encounter (Signed)
Advised patient as below. Medication was sent into the pharmacy. Confirmed follow up appt with the patient.

## 2017-08-19 DIAGNOSIS — N179 Acute kidney failure, unspecified: Secondary | ICD-10-CM | POA: Diagnosis not present

## 2017-08-19 DIAGNOSIS — I129 Hypertensive chronic kidney disease with stage 1 through stage 4 chronic kidney disease, or unspecified chronic kidney disease: Secondary | ICD-10-CM | POA: Diagnosis not present

## 2017-08-19 DIAGNOSIS — R600 Localized edema: Secondary | ICD-10-CM | POA: Diagnosis not present

## 2017-08-19 DIAGNOSIS — N183 Chronic kidney disease, stage 3 (moderate): Secondary | ICD-10-CM | POA: Diagnosis not present

## 2017-08-26 ENCOUNTER — Other Ambulatory Visit: Payer: Self-pay | Admitting: Family Medicine

## 2017-08-26 DIAGNOSIS — G8929 Other chronic pain: Secondary | ICD-10-CM

## 2017-08-26 DIAGNOSIS — R51 Headache: Principal | ICD-10-CM

## 2017-08-29 ENCOUNTER — Ambulatory Visit: Payer: Self-pay | Admitting: Family Medicine

## 2017-08-30 ENCOUNTER — Encounter: Payer: Self-pay | Admitting: Family Medicine

## 2017-08-30 ENCOUNTER — Ambulatory Visit (INDEPENDENT_AMBULATORY_CARE_PROVIDER_SITE_OTHER): Payer: Medicare Other | Admitting: Family Medicine

## 2017-08-30 VITALS — BP 154/82 | HR 67 | Temp 98.4°F | Resp 16 | Wt 107.0 lb

## 2017-08-30 DIAGNOSIS — N184 Chronic kidney disease, stage 4 (severe): Secondary | ICD-10-CM

## 2017-08-30 DIAGNOSIS — I10 Essential (primary) hypertension: Secondary | ICD-10-CM

## 2017-08-30 DIAGNOSIS — N183 Chronic kidney disease, stage 3 (moderate): Secondary | ICD-10-CM | POA: Diagnosis not present

## 2017-08-30 MED ORDER — DOXAZOSIN MESYLATE 1 MG PO TABS: 2.0000 mg | ORAL_TABLET | Freq: Every day | ORAL | 0 refills | Status: DC

## 2017-08-30 NOTE — Progress Notes (Signed)
Patient: Amy [name] Female    DOB: 10/21/1950   67 y.o.   MRN: [id number] Visit Date: 08/30/2017  Today's Provider: Lelon Huh, MD   Chief Complaint  Patient presents with  . Hypertension    follow up   Subjective:    HPI  Hypertension, follow-up:  BP Readings from Last 3 Encounters:  08/11/17 (!) 148/80  07/22/17 (!) 142/78  07/18/17 (!) 149/82    She was last seen for hypertension 1 months ago.  BP at that visit was 142/78. Management during that visit includes restarting Amlodipine. Since last office visit, patient had labs which showed kidney functions had worsened. BMET    Component Value Date/Time   NA 133 (L) 08/11/2017 1422   NA 139 09/21/2016 1521   K 4.0 08/11/2017 1422   CL 106 08/11/2017 1422   CO2 17 (L) 08/11/2017 1422   GLUCOSE 115 (H) 08/11/2017 1422   BUN 57 (H) 08/11/2017 1422   BUN 21 09/21/2016 1521   CREATININE 2.89 (H) 08/11/2017 1422   CREATININE 1.93 (H) 04/05/2017 1451   CALCIUM 9.0 08/11/2017 1422   GFRNONAA 16 (L) 08/11/2017 1422   GFRAA 18 (L) 08/11/2017 1422    Patient was advised to stop Irbesartan and start Doxazosin to take in its place. She is tolerating 79m a day well with no side effects. She has since had follow up with her nephrologist and states her kidney functions have improved, although she is not sure water her numbers were. She states she  She reports good compliance with treatment. She is not having side effects.  She is not exercising. She is adherent to low salt diet.   Outside blood pressures are not being checked. She is experiencing none.  Patient denies chest pain, chest pressure/discomfort, claudication, dyspnea, exertional chest pressure/discomfort, irregular heart beat, lower extremity edema, near-syncope, orthopnea, palpitations, paroxysmal nocturnal dyspnea, syncope and tachypnea.   Cardiovascular risk factors include advanced age (older than 570for men, 616for women) and hypertension.  Use of  agents associated with hypertension: NSAIDS.     Weight trend: fluctuating a bit Wt Readings from Last 3 Encounters:  08/11/17 109 lb 6.4 oz (49.6 kg)  07/22/17 111 lb (50.3 kg)  06/20/17 112 lb 4 oz (50.9 kg)    Current diet: in general, a "healthy" diet    ------------------------------------------------------------------------    Allergies  Allergen Reactions  . Cephalosporins   . Clarithromycin   . Lunesta  [Eszopiclone]     Heart racing  . Penicillin V     Has patient had a PCN reaction causing immediate rash, facial/tongue/throat swelling, SOB or lightheadedness with hypotension: Yes Has patient had a PCN reaction causing severe rash involving mucus membranes or skin necrosis: No Has patient had a PCN reaction that required hospitalization No Has patient had a PCN reaction occurring within the last 10 years: No If all of the above answers are "NO", then may proceed with Cephalosporin use.  . Sulfa Antibiotics      Current Outpatient Medications:  .  amLODipine (NORVASC) 5 MG tablet, TAKE 1 TABLET(5 MG) BY MOUTH DAILY, Disp: 90 tablet, Rfl: 0 .  aspirin EC 81 MG EC tablet, Take 1 tablet (81 mg total) by mouth daily., Disp: , Rfl:  .  azelastine (ASTELIN) 0.1 % nasal spray, Place 2 sprays into both nostrils 2 (two) times daily. Use in each nostril as directed, Disp: 30 mL, Rfl: 12 .  buPROPion (WELLBUTRIN) 75 MG  tablet, Take 1 tablet (75 mg total) by mouth 2 (two) times daily., Disp: 180 tablet, Rfl: 4 .  butalbital-acetaminophen-caffeine (FIORICET, ESGIC) 50-325-40 MG tablet, TAKE 1 TO 2 TABLETS BY MOUTH EVERY 4 TO 6 HOURS AS NEEDED FOR PAIN. MAX OF 8 PER DAY, Disp: 150 tablet, Rfl: 0 .  carvedilol (COREG) 25 MG tablet, TAKE 1 TABLET(25 MG) BY MOUTH TWICE DAILY WITH A MEAL, Disp: 60 tablet, Rfl: 5 .  cloNIDine (CATAPRES) 0.1 MG tablet, TAKE 1 TABLET(0.1 MG) BY MOUTH TWICE DAILY, Disp: 60 tablet, Rfl: 1 .  doxazosin (CARDURA) 1 MG tablet, TAKE 1/2 TABLET BY MOUTH DAILY FOR  6 DAYS. INCREASE TO 1 TABLET BY MOUTH DAILY, Disp: 90 tablet, Rfl: 0 .  fluticasone (FLONASE) 50 MCG/ACT nasal spray, Place 1-2 sprays into both nostrils daily., Disp: 16 g, Rfl: 5 .  furosemide (LASIX) 20 MG tablet, furosemide 20 mg tablet  TK 1 T PO QD PRF SWELLING, Disp: , Rfl:  .  hydrALAZINE (APRESOLINE) 50 MG tablet, Take 0.5 tablets (25 mg total) by mouth 3 (three) times daily. if your Systolic VZ>482 or Diastolic LM>786, Disp: 3 tablet, Rfl: 1 .  LORazepam (ATIVAN) 1 MG tablet, TAKE 1 TABLET BY MOUTH TWICE DAILY AS NEEDED, Disp: 180 tablet, Rfl: 5 .  pantoprazole (PROTONIX) 40 MG tablet, Take 1 tablet (40 mg total) by mouth daily., Disp: 30 tablet, Rfl: 5 .  ranitidine (ZANTAC) 150 MG tablet, Take 150 mg by mouth 2 (two) times daily., Disp: , Rfl:  .  valACYclovir (VALTREX) 1000 MG tablet, 2 tablets twice a day for 1 day as needed for cold sores, Disp: 20 tablet, Rfl: 1 .  zolpidem (AMBIEN) 10 MG tablet, TAKE 1 TABLET BY MOUTH AT BEDTIME AS NEEDED, Disp: 30 tablet, Rfl: 5 .  amoxicillin (AMOXIL) 875 MG tablet, TAKE 1 TABLET BY MOUTH TWICE DAILY IN ADDITION TO AMOXICILLIN-CLAVULANATE TABLETS (Patient not taking: Reported on 08/30/2017), Disp: 20 tablet, Rfl: 0  Review of Systems  Constitutional: Positive for fatigue. Negative for appetite change, chills and fever.  Respiratory: Negative for chest tightness and shortness of breath.   Cardiovascular: Negative for chest pain and palpitations.  Gastrointestinal: Positive for diarrhea (x 2 days). Negative for abdominal pain, nausea and vomiting.  Neurological: Positive for light-headedness. Negative for dizziness and weakness.    Social History   Tobacco Use  . Smoking status: Never Smoker  . Smokeless tobacco: Never Used  Substance Use Topics  . Alcohol use: Yes    Alcohol/week: 0.0 oz    Comment: few glasses a wine/wk.   Objective:   BP (!) 154/82 (BP Location: Right Arm, Cuff Size: Normal)   Pulse 67   Temp 98.4 F (36.9 C)  (Oral)   Resp 16   Wt 107 lb (48.5 kg)   SpO2 99% Comment: room air  BMI 18.37 kg/m  Vitals:   08/30/17 1608 08/30/17 1613  BP: (!) 152/76 (!) 154/82  Pulse: 67   Resp: 16   Temp: 98.4 F (36.9 C)   TempSrc: Oral   SpO2: 99%   Weight: 107 lb (48.5 kg)      Physical Exam  General appearance: alert, well developed, well nourished, cooperative and in no distress Head: Normocephalic, without obvious abnormality, atraumatic Respiratory: Respirations even and unlabored, normal respiratory rate Extremities: No gross deformities Skin: Skin color, texture, turgor normal. No rashes seen  Psych: Appropriate mood and affect. Neurologic: Mental status: Alert, oriented to person, place, and time, thought content appropriate.  Assessment & Plan:     1. CKD (chronic kidney disease) stage 4, GFR 15-29 ml/min (HCC) Off irbesartan since 4-12 due to worsening eGFR, but she reports labs improved when checked by Dr. Candiss Norse 4-19.   2. Essential hypertension Tolerating initiation of doxazosin well with no adverse effects. Increase to 2x76m tablets. Recheck BP one month. Anticipate titrating up as tolerated and hopefully getting off of clonidine.        DLelon Huh MD  [facility]

## 2017-08-30 NOTE — Patient Instructions (Signed)
   Increase doxazosin to two tablets daily, for a total of 2mg  of medication a day   Call if your blood pressure gets under 110

## 2017-09-01 ENCOUNTER — Inpatient Hospital Stay: Payer: Medicare Other

## 2017-09-01 ENCOUNTER — Inpatient Hospital Stay: Payer: Medicare Other | Attending: Oncology

## 2017-09-01 VITALS — BP 158/73

## 2017-09-01 DIAGNOSIS — D509 Iron deficiency anemia, unspecified: Secondary | ICD-10-CM

## 2017-09-01 DIAGNOSIS — D631 Anemia in chronic kidney disease: Secondary | ICD-10-CM | POA: Insufficient documentation

## 2017-09-01 DIAGNOSIS — N184 Chronic kidney disease, stage 4 (severe): Secondary | ICD-10-CM | POA: Insufficient documentation

## 2017-09-01 LAB — SAMPLE TO BLOOD BANK

## 2017-09-01 LAB — CBC WITH DIFFERENTIAL/PLATELET
Basophils Absolute: 0.1 10*3/uL (ref 0–0.1)
Basophils Relative: 2 %
Eosinophils Absolute: 0.3 10*3/uL (ref 0–0.7)
Eosinophils Relative: 8 %
HEMATOCRIT: 27.3 % — AB (ref 35.0–47.0)
HEMOGLOBIN: 9.4 g/dL — AB (ref 12.0–16.0)
LYMPHS PCT: 25 %
Lymphs Abs: 0.9 10*3/uL — ABNORMAL LOW (ref 1.0–3.6)
MCH: 31.8 pg (ref 26.0–34.0)
MCHC: 34.5 g/dL (ref 32.0–36.0)
MCV: 92.2 fL (ref 80.0–100.0)
MONO ABS: 0.3 10*3/uL (ref 0.2–0.9)
Monocytes Relative: 10 %
NEUTROS ABS: 1.9 10*3/uL (ref 1.4–6.5)
NEUTROS PCT: 55 %
Platelets: 234 10*3/uL (ref 150–440)
RBC: 2.96 MIL/uL — AB (ref 3.80–5.20)
RDW: 15 % — AB (ref 11.5–14.5)
WBC: 3.4 10*3/uL — ABNORMAL LOW (ref 3.6–11.0)

## 2017-09-01 MED ORDER — EPOETIN ALFA 40000 UNIT/ML IJ SOLN
40000.0000 [IU] | Freq: Once | INTRAMUSCULAR | Status: AC
  Administered 2017-09-01: 40000 [IU] via SUBCUTANEOUS

## 2017-09-12 ENCOUNTER — Other Ambulatory Visit: Payer: Self-pay | Admitting: Family Medicine

## 2017-09-12 DIAGNOSIS — R519 Headache, unspecified: Secondary | ICD-10-CM

## 2017-09-12 DIAGNOSIS — R51 Headache: Principal | ICD-10-CM

## 2017-09-13 ENCOUNTER — Telehealth: Payer: Self-pay

## 2017-09-13 DIAGNOSIS — R600 Localized edema: Secondary | ICD-10-CM | POA: Diagnosis not present

## 2017-09-13 DIAGNOSIS — N184 Chronic kidney disease, stage 4 (severe): Secondary | ICD-10-CM | POA: Diagnosis not present

## 2017-09-13 DIAGNOSIS — I129 Hypertensive chronic kidney disease with stage 1 through stage 4 chronic kidney disease, or unspecified chronic kidney disease: Secondary | ICD-10-CM | POA: Diagnosis not present

## 2017-09-13 DIAGNOSIS — B001 Herpesviral vesicular dermatitis: Secondary | ICD-10-CM

## 2017-09-13 DIAGNOSIS — E871 Hypo-osmolality and hyponatremia: Secondary | ICD-10-CM | POA: Diagnosis not present

## 2017-09-13 DIAGNOSIS — J329 Chronic sinusitis, unspecified: Secondary | ICD-10-CM

## 2017-09-13 DIAGNOSIS — N2581 Secondary hyperparathyroidism of renal origin: Secondary | ICD-10-CM | POA: Insufficient documentation

## 2017-09-13 MED ORDER — AMOXICILLIN-POT CLAVULANATE 875-125 MG PO TABS: 1.0000 | ORAL_TABLET | Freq: Two times a day (BID) | ORAL | 0 refills | Status: DC

## 2017-09-13 MED ORDER — AMOXICILLIN 875 MG PO TABS: 875.0000 mg | ORAL_TABLET | Freq: Two times a day (BID) | ORAL | 0 refills | Status: DC

## 2017-09-13 MED ORDER — VALACYCLOVIR HCL 1 G PO TABS: ORAL_TABLET | ORAL | 1 refills | Status: DC

## 2017-09-13 NOTE — Telephone Encounter (Signed)
Patient called saying that she feels that she may have a sinus infection. She has nasal congestion, headache, coughing up thick mucus X 3 days. She denies fever. She has not taken anything OTC for her symptoms. She is requesting something called in for this.  Patient also mentions that she has a fever blister that seems to be spreading and would like something called in for that also. Patient uses [facility] on [address]. Psychologist, counselling is correct. Thanks!

## 2017-09-13 NOTE — Telephone Encounter (Signed)
Please advise 

## 2017-09-14 ENCOUNTER — Other Ambulatory Visit: Payer: Self-pay | Admitting: Family Medicine

## 2017-09-14 DIAGNOSIS — I10 Essential (primary) hypertension: Secondary | ICD-10-CM

## 2017-09-20 ENCOUNTER — Telehealth: Payer: Self-pay | Admitting: Oncology

## 2017-09-20 NOTE — Telephone Encounter (Signed)
Move appts due to pt request. Received scheduling messge from Toma Deiters that pt was out of town and needed appts moved. BF

## 2017-09-22 ENCOUNTER — Inpatient Hospital Stay: Payer: Medicare Other

## 2017-09-27 ENCOUNTER — Encounter: Payer: Self-pay | Admitting: Family Medicine

## 2017-09-27 ENCOUNTER — Ambulatory Visit (INDEPENDENT_AMBULATORY_CARE_PROVIDER_SITE_OTHER): Payer: Medicare Other | Admitting: Family Medicine

## 2017-09-27 ENCOUNTER — Other Ambulatory Visit: Payer: Self-pay | Admitting: Family Medicine

## 2017-09-27 VITALS — BP 172/78 | HR 74 | Temp 98.4°F | Resp 16 | Wt 112.0 lb

## 2017-09-27 DIAGNOSIS — I10 Essential (primary) hypertension: Secondary | ICD-10-CM

## 2017-09-27 DIAGNOSIS — R51 Headache: Secondary | ICD-10-CM | POA: Diagnosis not present

## 2017-09-27 DIAGNOSIS — G8929 Other chronic pain: Secondary | ICD-10-CM

## 2017-09-27 MED ORDER — BUTALBITAL-APAP-CAFFEINE 50-325-40 MG PO TABS: ORAL_TABLET | ORAL | 0 refills | Status: DC

## 2017-09-27 MED ORDER — DOXAZOSIN MESYLATE 4 MG PO TABS: 4.0000 mg | ORAL_TABLET | Freq: Every day | ORAL | 1 refills | Status: DC

## 2017-09-27 NOTE — Progress Notes (Signed)
Patient: Amy [name] Female    DOB: [birth year]   67 y.o.   MRN: [id number] Visit Date: 09/27/2017  Today's Provider: Lelon Huh, MD   Chief Complaint  Patient presents with  . Hypertension   Subjective:    HPI   Hypertension, follow-up:  BP Readings from Last 3 Encounters:  09/27/17 (!) 172/78  09/01/17 (!) 158/73  08/30/17 (!) 154/82    She was last seen for hypertension 4 weeks ago.  BP at that visit was 152/76. Management since that visit includes; increased Doxazosin to 2 tablets qd, for a total of 2 mg of medication a day.She reports good compliance with treatment. She is not having side effects.  She is not exercising. She is not adherent to low salt diet.   Outside blood pressures are not being checked. She is experiencing lower extremity edema.  Patient denies chest pain, chest pressure/discomfort, claudication, dyspnea, exertional chest pressure/discomfort, irregular heart beat, near-syncope, orthopnea, palpitations, paroxysmal nocturnal dyspnea, syncope and tachypnea.   Cardiovascular risk factors include hypertension.  Use of agents associated with hypertension: NSAIDS.   ------------------------------------------------------------------------ She is currently taking clonidine twice every day  carvedilol twice every day amlodipine 5mg  every day and hydralazine one tablet a couple of times day. She took a full one this morning. She is tolerating medications well without adverse effects.      Allergies  Allergen Reactions  . Cephalosporins   . Clarithromycin   . Lunesta  [Eszopiclone]     Heart racing  . Penicillin V     Has patient had a PCN reaction causing immediate rash, facial/tongue/throat swelling, SOB or lightheadedness with hypotension: Yes Has patient had a PCN reaction causing severe rash involving mucus membranes or skin necrosis: No Has patient had a PCN reaction that required hospitalization No Has patient had a PCN reaction  occurring within the last 10 years: No If all of the above answers are "NO", then may proceed with Cephalosporin use.  . Sulfa Antibiotics      Current Outpatient Medications:  .  amLODipine (NORVASC) 5 MG tablet, TAKE 1 TABLET(5 MG) BY MOUTH DAILY, Disp: 30 tablet, Rfl: 2 .  aspirin EC 81 MG EC tablet, Take 1 tablet (81 mg total) by mouth daily., Disp: , Rfl:  .  azelastine (ASTELIN) 0.1 % nasal spray, Place 2 sprays into both nostrils 2 (two) times daily. Use in each nostril as directed, Disp: 30 mL, Rfl: 12 .  buPROPion (WELLBUTRIN) 75 MG tablet, Take 1 tablet (75 mg total) by mouth 2 (two) times daily., Disp: 180 tablet, Rfl: 4 .  butalbital-acetaminophen-caffeine (FIORICET, ESGIC) 50-325-40 MG tablet, TAKE 1 TO 2 TABLETS BY MOUTH EVERY 4 TO 6 HOURS AS NEEDED FOR PAIN. MAX OF 8 PER DAY, Disp: 150 tablet, Rfl: 0 .  carvedilol (COREG) 25 MG tablet, TAKE 1 TABLET(25 MG) BY MOUTH TWICE DAILY WITH A MEAL, Disp: 60 tablet, Rfl: 5 .  cloNIDine (CATAPRES) 0.1 MG tablet, TAKE 1 TABLET(0.1 MG) BY MOUTH TWICE DAILY, Disp: 60 tablet, Rfl: 1 .  doxazosin (CARDURA) 1 MG tablet, Take 2 tablets (2 mg total) by mouth daily., Disp: 3 tablet, Rfl: 0 .  fluticasone (FLONASE) 50 MCG/ACT nasal spray, Place 1-2 sprays into both nostrils daily., Disp: 16 g, Rfl: 5 .  furosemide (LASIX) 20 MG tablet, furosemide 20 mg tablet  TK 1 T PO QD PRF SWELLING, Disp: , Rfl:  .  hydrALAZINE (APRESOLINE) 50 MG tablet, Take 0.5 tablets (25  mg total) by mouth 3 (three) times daily. if your Systolic WK>462 or Diastolic MM>381, Disp: 3 tablet, Rfl: 1 .  LORazepam (ATIVAN) 1 MG tablet, TAKE 1 TABLET BY MOUTH TWICE DAILY AS NEEDED, Disp: 180 tablet, Rfl: 1 .  pantoprazole (PROTONIX) 40 MG tablet, Take 1 tablet (40 mg total) by mouth daily., Disp: 30 tablet, Rfl: 5 .  ranitidine (ZANTAC) 150 MG tablet, Take 150 mg by mouth 2 (two) times daily., Disp: , Rfl:  .  valACYclovir (VALTREX) 1000 MG tablet, 2 tablets twice a day for 1 day as  needed for cold sores, Disp: 20 tablet, Rfl: 1 .  zolpidem (AMBIEN) 10 MG tablet, TAKE 1 TABLET BY MOUTH AT BEDTIME AS NEEDED, Disp: 30 tablet, Rfl: 5  Review of Systems  Constitutional: Positive for fatigue. Negative for appetite change, chills and fever.  HENT: Positive for voice change.   Respiratory: Negative for chest tightness and shortness of breath.   Cardiovascular: Positive for leg swelling. Negative for chest pain and palpitations.  Gastrointestinal: Negative for abdominal pain, nausea and vomiting.  Neurological: Negative for dizziness and weakness.    Social History   Tobacco Use  . Smoking status: Never Smoker  . Smokeless tobacco: Never Used  Substance Use Topics  . Alcohol use: Yes    Alcohol/week: 0.0 oz    Comment: few glasses a wine/wk.   Objective:   BP (!) 172/78 (BP Location: Right Arm, Cuff Size: Normal)   Pulse 74   Temp 98.4 F (36.9 C) (Oral)   Resp 16   Wt 112 lb (50.8 kg)   SpO2 99% Comment: room air  BMI 19.22 kg/m  Vitals:   09/27/17 1400 09/27/17 1404  BP: (!) 160/80 (!) 172/78  Pulse: 74   Resp: 16   Temp: 98.4 F (36.9 C)   TempSrc: Oral   SpO2: 99%   Weight: 112 lb (50.8 kg)      Physical Exam  General appearance: alert, well developed, well nourished, cooperative and in no distress Head: Normocephalic, without obvious abnormality, atraumatic Respiratory: Respirations even and unlabored, normal respiratory rate Extremities: No gross deformities Skin: Skin color, texture, turgor normal. No rashes seen  Psych: Appropriate mood and affect. Neurologic: Mental status: Alert, oriented to person, place, and time, thought content appropriate.     Assessment & Plan:     1. Essential hypertension increasse- doxazosin (CARDURA) 4 MG tablet; Take 1 tablet (4 mg total) by mouth daily.  Dispense: 30 tablet; Refill: 1  Recheck BP 1 month.   2. Chronic nonintractable headache, unspecified headache type refill-  butalbital-acetaminophen-caffeine (FIORICET, ESGIC) 50-325-40 MG tablet; TAKE 1 TO 2 TABLETS BY MOUTH EVERY 4 TO 6 HOURS AS NEEDED FOR PAIN. MAX OF 8 PER DAY  Dispense: 150 tablet; Refill: 0       Lelon Huh, MD  [facility]

## 2017-09-29 ENCOUNTER — Other Ambulatory Visit: Payer: Self-pay | Admitting: Family Medicine

## 2017-09-29 ENCOUNTER — Other Ambulatory Visit: Payer: Self-pay

## 2017-09-29 ENCOUNTER — Inpatient Hospital Stay: Payer: Medicare Other

## 2017-09-29 VITALS — BP 140/65 | HR 66

## 2017-09-29 DIAGNOSIS — D509 Iron deficiency anemia, unspecified: Secondary | ICD-10-CM

## 2017-09-29 DIAGNOSIS — J329 Chronic sinusitis, unspecified: Secondary | ICD-10-CM

## 2017-09-29 DIAGNOSIS — D631 Anemia in chronic kidney disease: Secondary | ICD-10-CM

## 2017-09-29 DIAGNOSIS — N184 Chronic kidney disease, stage 4 (severe): Secondary | ICD-10-CM | POA: Diagnosis not present

## 2017-09-29 LAB — CBC WITH DIFFERENTIAL/PLATELET
BASOS ABS: 0.1 10*3/uL (ref 0–0.1)
Basophils Relative: 2 %
Eosinophils Absolute: 0.2 10*3/uL (ref 0–0.7)
Eosinophils Relative: 5 %
HCT: 26.9 % — ABNORMAL LOW (ref 35.0–47.0)
Hemoglobin: 9 g/dL — ABNORMAL LOW (ref 12.0–16.0)
LYMPHS ABS: 0.6 10*3/uL — AB (ref 1.0–3.6)
Lymphocytes Relative: 16 %
MCH: 31.6 pg (ref 26.0–34.0)
MCHC: 33.6 g/dL (ref 32.0–36.0)
MCV: 94.2 fL (ref 80.0–100.0)
MONO ABS: 0.3 10*3/uL (ref 0.2–0.9)
Monocytes Relative: 7 %
NEUTROS ABS: 2.8 10*3/uL (ref 1.4–6.5)
Neutrophils Relative %: 70 %
Platelets: 262 10*3/uL (ref 150–440)
RBC: 2.86 MIL/uL — AB (ref 3.80–5.20)
RDW: 16 % — AB (ref 11.5–14.5)
WBC: 3.9 10*3/uL (ref 3.6–11.0)

## 2017-09-29 LAB — SAMPLE TO BLOOD BANK

## 2017-09-29 MED ORDER — EPOETIN ALFA 40000 UNIT/ML IJ SOLN
40000.0000 [IU] | Freq: Once | INTRAMUSCULAR | Status: AC
  Administered 2017-09-29: 40000 [IU] via SUBCUTANEOUS

## 2017-10-03 ENCOUNTER — Encounter: Payer: Self-pay | Admitting: Family Medicine

## 2017-10-06 DIAGNOSIS — M1711 Unilateral primary osteoarthritis, right knee: Secondary | ICD-10-CM | POA: Diagnosis not present

## 2017-10-11 ENCOUNTER — Other Ambulatory Visit: Payer: Self-pay | Admitting: Family Medicine

## 2017-10-11 DIAGNOSIS — G8929 Other chronic pain: Secondary | ICD-10-CM

## 2017-10-11 DIAGNOSIS — R51 Headache: Principal | ICD-10-CM

## 2017-10-13 ENCOUNTER — Inpatient Hospital Stay: Payer: Medicare Other

## 2017-10-18 ENCOUNTER — Other Ambulatory Visit: Payer: Self-pay | Admitting: Family Medicine

## 2017-10-18 DIAGNOSIS — J329 Chronic sinusitis, unspecified: Secondary | ICD-10-CM

## 2017-10-19 ENCOUNTER — Ambulatory Visit (INDEPENDENT_AMBULATORY_CARE_PROVIDER_SITE_OTHER): Payer: Medicare Other | Admitting: Family Medicine

## 2017-10-19 ENCOUNTER — Other Ambulatory Visit: Payer: Self-pay | Admitting: Family Medicine

## 2017-10-19 ENCOUNTER — Encounter: Payer: Self-pay | Admitting: Family Medicine

## 2017-10-19 DIAGNOSIS — I10 Essential (primary) hypertension: Secondary | ICD-10-CM

## 2017-10-19 MED ORDER — DOXAZOSIN MESYLATE 8 MG PO TABS: 8.0000 mg | ORAL_TABLET | Freq: Every day | ORAL | 1 refills | Status: DC

## 2017-10-19 NOTE — Progress Notes (Signed)
Patient: Amy [name] Female    DOB: 1950/06/28   67 y.o.   MRN: [id number] Visit Date: 10/19/2017  Today's Provider: Lelon Huh, MD   Chief Complaint  Patient presents with  . Follow-up  . Hypertension   Subjective:    HPI   Hypertension, follow-up:  BP Readings from Last 3 Encounters:  10/19/17 (!) 170/72  09/29/17 140/65  09/27/17 (!) 172/78    She was last seen for hypertension 3 weeks ago.  BP at that visit was 160/80. Management since that visit includes; increased Doxazosin to 4 mg qd.She reports good compliance with treatment. She is not having side effects. none She is not exercising. She is not adherent to low salt diet.   Outside blood pressures are not checking. She is experiencing none.  Patient denies none.   Cardiovascular risk factors include advanced age (older than 85 for men, 80 for women).  Use of agents associated with hypertension: none -----------------------------------------------------------------    Allergies  Allergen Reactions  . Cephalosporins   . Clarithromycin   . Lunesta  [Eszopiclone]     Heart racing  . Penicillin V     Has patient had a PCN reaction causing immediate rash, facial/tongue/throat swelling, SOB or lightheadedness with hypotension: Yes Has patient had a PCN reaction causing severe rash involving mucus membranes or skin necrosis: No Has patient had a PCN reaction that required hospitalization No Has patient had a PCN reaction occurring within the last 10 years: No If all of the above answers are "NO", then may proceed with Cephalosporin use.  . Sulfa Antibiotics    Audit-C Alcohol Use Screening   Alcohol Use Disorder Test (AUDIT) 10/19/2017  1. How often do you have a drink containing alcohol? 2  2. How many drinks containing alcohol do you have on a typical day when you are drinking? 0  3. How often do you have six or more drinks on one occasion? 0  AUDIT-C Score 2    A score of 3 or more in  women, and 4 or more in men indicates increased risk for alcohol abuse, EXCEPT if all of the points are from question 1   Current Outpatient Medications:  .  amLODipine (NORVASC) 5 MG tablet, TAKE 1 TABLET(5 MG) BY MOUTH DAILY, Disp: 30 tablet, Rfl: 2 .  aspirin EC 81 MG EC tablet, Take 1 tablet (81 mg total) by mouth daily., Disp: , Rfl:  .  azelastine (ASTELIN) 0.1 % nasal spray, Place 2 sprays into both nostrils 2 (two) times daily. Use in each nostril as directed, Disp: 30 mL, Rfl: 12 .  buPROPion (WELLBUTRIN) 75 MG tablet, Take 1 tablet (75 mg total) by mouth 2 (two) times daily., Disp: 180 tablet, Rfl: 4 .  butalbital-acetaminophen-caffeine (FIORICET, ESGIC) 50-325-40 MG tablet, TAKE 1 TO 2 TABLETS BY MOUTH EVERY 4 TO 6 HOURS AS NEEDED FOR PAIN. MAX OF 8 PER DAY, Disp: 150 tablet, Rfl: 0 .  carvedilol (COREG) 25 MG tablet, TAKE 1 TABLET(25 MG) BY MOUTH TWICE DAILY WITH A MEAL, Disp: 60 tablet, Rfl: 5 .  cloNIDine (CATAPRES) 0.1 MG tablet, TAKE 1 TABLET(0.1 MG) BY MOUTH TWICE DAILY, Disp: 60 tablet, Rfl: 0 .  doxazosin (CARDURA) 4 MG tablet, Take 1 tablet (4 mg total) by mouth daily., Disp: 30 tablet, Rfl: 1 .  fluticasone (FLONASE) 50 MCG/ACT nasal spray, Place 1-2 sprays into both nostrils daily., Disp: 16 g, Rfl: 5 .  furosemide (LASIX) 20 MG  tablet, furosemide 20 mg tablet  TK 1 T PO QD PRF SWELLING, Disp: , Rfl:  .  hydrALAZINE (APRESOLINE) 50 MG tablet, Take 0.5 tablets (25 mg total) by mouth 3 (three) times daily. if your Systolic KG>401 or Diastolic UU>725, Disp: 3 tablet, Rfl: 1 .  LORazepam (ATIVAN) 1 MG tablet, TAKE 1 TABLET BY MOUTH TWICE DAILY AS NEEDED, Disp: 180 tablet, Rfl: 1 .  pantoprazole (PROTONIX) 40 MG tablet, Take 1 tablet (40 mg total) by mouth daily., Disp: 30 tablet, Rfl: 5 .  ranitidine (ZANTAC) 150 MG tablet, Take 150 mg by mouth 2 (two) times daily., Disp: , Rfl:  .  valACYclovir (VALTREX) 1000 MG tablet, 2 tablets twice a day for 1 day as needed for cold sores,  Disp: 20 tablet, Rfl: 1 .  zolpidem (AMBIEN) 10 MG tablet, TAKE 1 TABLET BY MOUTH AT BEDTIME AS NEEDED, Disp: 30 tablet, Rfl: 5 .  amoxicillin (AMOXIL) 875 MG tablet, TAKE 1 TABLET(875 MG) BY MOUTH TWICE DAILY (Patient not taking: Reported on 10/19/2017), Disp: 20 tablet, Rfl: 0 .  amoxicillin-clavulanate (AUGMENTIN) 875-125 MG tablet, TAKE 1 TABLET BY MOUTH TWICE DAILY FOR 10 DAYS (Patient not taking: Reported on 10/19/2017), Disp: 20 tablet, Rfl: 0  Review of Systems  Constitutional: Negative for appetite change, chills, fatigue and fever.  Respiratory: Negative for chest tightness and shortness of breath.   Cardiovascular: Negative for chest pain and palpitations.  Gastrointestinal: Negative for abdominal pain, nausea and vomiting.  Neurological: Negative for dizziness and weakness.    Social History   Tobacco Use  . Smoking status: Never Smoker  . Smokeless tobacco: Never Used  Substance Use Topics  . Alcohol use: Yes    Alcohol/week: 0.0 oz    Comment: few glasses a wine/wk.   Objective:    Vitals:   10/19/17 1348 10/19/17 1355 10/19/17 1409  BP: (!) 170/72 (!) 150/70 (!) 150/68  Pulse: 72    Resp: 16    Temp: 98 F (36.7 C)    TempSrc: Oral    SpO2: 97%    Weight: 114 lb (51.7 kg)    Height: 5\' 4"  (1.626 m)       Physical Exam  General appearance: alert, well developed, well nourished, cooperative and in no distress Head: Normocephalic, without obvious abnormality, atraumatic Respiratory: Respirations even and unlabored, normal respiratory rate Extremities: No gross deformities Skin: Skin color, texture, turgor normal. No rashes seen  Psych: Appropriate mood and affect. Neurologic: Mental status: Alert, oriented to person, place, and time, thought content appropriate.     Assessment & Plan:     1. Essential hypertension Improving, but not too goal, increase- doxazosin (CARDURA) 8 MG tablet; Take 1 tablet (8 mg total) by mouth daily.  Dispense: 30 tablet;  Refill: 1  Anticipated getting off clonidine when BP reaches goal. May need to increase amlodipine or add thiazide at some point.   Return in about 1 month (around 11/18/2017).       Lelon Huh, MD  [facility]

## 2017-10-19 NOTE — Patient Instructions (Signed)
You can increase doxazosin to 4mg  twice a day until you current bottle runs out, then start 8mg  ONCE a day.

## 2017-10-20 ENCOUNTER — Inpatient Hospital Stay: Payer: Medicare Other

## 2017-10-20 ENCOUNTER — Inpatient Hospital Stay: Payer: Medicare Other | Attending: Oncology

## 2017-10-20 DIAGNOSIS — D631 Anemia in chronic kidney disease: Secondary | ICD-10-CM | POA: Insufficient documentation

## 2017-10-20 DIAGNOSIS — N184 Chronic kidney disease, stage 4 (severe): Secondary | ICD-10-CM | POA: Diagnosis not present

## 2017-10-20 LAB — CBC WITH DIFFERENTIAL/PLATELET
BASOS ABS: 0.1 10*3/uL (ref 0–0.1)
Basophils Relative: 2 %
EOS ABS: 0.2 10*3/uL (ref 0–0.7)
EOS PCT: 6 %
HCT: 24.7 % — ABNORMAL LOW (ref 35.0–47.0)
Hemoglobin: 8.2 g/dL — ABNORMAL LOW (ref 12.0–16.0)
LYMPHS ABS: 0.6 10*3/uL — AB (ref 1.0–3.6)
LYMPHS PCT: 19 %
MCH: 31.8 pg (ref 26.0–34.0)
MCHC: 33.2 g/dL (ref 32.0–36.0)
MCV: 96 fL (ref 80.0–100.0)
MONO ABS: 0.3 10*3/uL (ref 0.2–0.9)
Monocytes Relative: 8 %
Neutro Abs: 2.1 10*3/uL (ref 1.4–6.5)
Neutrophils Relative %: 65 %
PLATELETS: 204 10*3/uL (ref 150–440)
RBC: 2.57 MIL/uL — ABNORMAL LOW (ref 3.80–5.20)
RDW: 17.3 % — AB (ref 11.5–14.5)
WBC: 3.2 10*3/uL — AB (ref 3.6–11.0)

## 2017-10-20 LAB — SAMPLE TO BLOOD BANK

## 2017-10-24 DIAGNOSIS — I129 Hypertensive chronic kidney disease with stage 1 through stage 4 chronic kidney disease, or unspecified chronic kidney disease: Secondary | ICD-10-CM | POA: Diagnosis not present

## 2017-10-24 DIAGNOSIS — E871 Hypo-osmolality and hyponatremia: Secondary | ICD-10-CM | POA: Diagnosis not present

## 2017-10-24 DIAGNOSIS — N184 Chronic kidney disease, stage 4 (severe): Secondary | ICD-10-CM | POA: Diagnosis not present

## 2017-10-24 DIAGNOSIS — R600 Localized edema: Secondary | ICD-10-CM | POA: Diagnosis not present

## 2017-10-26 ENCOUNTER — Other Ambulatory Visit: Payer: Self-pay | Admitting: Family Medicine

## 2017-10-26 DIAGNOSIS — R51 Headache: Principal | ICD-10-CM

## 2017-10-26 DIAGNOSIS — R519 Headache, unspecified: Secondary | ICD-10-CM

## 2017-10-27 NOTE — Telephone Encounter (Signed)
Requesting 90 day supply.

## 2017-10-31 DIAGNOSIS — M1711 Unilateral primary osteoarthritis, right knee: Secondary | ICD-10-CM | POA: Diagnosis not present

## 2017-10-31 DIAGNOSIS — M25561 Pain in right knee: Secondary | ICD-10-CM | POA: Diagnosis not present

## 2017-11-07 DIAGNOSIS — M1711 Unilateral primary osteoarthritis, right knee: Secondary | ICD-10-CM | POA: Diagnosis not present

## 2017-11-10 ENCOUNTER — Inpatient Hospital Stay: Payer: Medicare Other | Attending: Oncology

## 2017-11-10 ENCOUNTER — Other Ambulatory Visit: Payer: Self-pay

## 2017-11-10 ENCOUNTER — Inpatient Hospital Stay: Payer: Medicare Other

## 2017-11-10 VITALS — BP 153/64

## 2017-11-10 DIAGNOSIS — D509 Iron deficiency anemia, unspecified: Secondary | ICD-10-CM

## 2017-11-10 DIAGNOSIS — N184 Chronic kidney disease, stage 4 (severe): Principal | ICD-10-CM

## 2017-11-10 DIAGNOSIS — D631 Anemia in chronic kidney disease: Secondary | ICD-10-CM | POA: Diagnosis not present

## 2017-11-10 LAB — CBC WITH DIFFERENTIAL/PLATELET
BASOS PCT: 2 %
Basophils Absolute: 0.1 10*3/uL (ref 0–0.1)
EOS ABS: 0.2 10*3/uL (ref 0–0.7)
Eosinophils Relative: 4 %
HCT: 24.7 % — ABNORMAL LOW (ref 35.0–47.0)
HEMOGLOBIN: 8.1 g/dL — AB (ref 12.0–16.0)
Lymphocytes Relative: 15 %
Lymphs Abs: 0.7 10*3/uL — ABNORMAL LOW (ref 1.0–3.6)
MCH: 31.5 pg (ref 26.0–34.0)
MCHC: 33 g/dL (ref 32.0–36.0)
MCV: 95.4 fL (ref 80.0–100.0)
MONOS PCT: 7 %
Monocytes Absolute: 0.3 10*3/uL (ref 0.2–0.9)
NEUTROS PCT: 72 %
Neutro Abs: 3.5 10*3/uL (ref 1.4–6.5)
Platelets: 197 10*3/uL (ref 150–440)
RBC: 2.59 MIL/uL — ABNORMAL LOW (ref 3.80–5.20)
RDW: 16 % — AB (ref 11.5–14.5)
WBC: 4.8 10*3/uL (ref 3.6–11.0)

## 2017-11-10 LAB — SAMPLE TO BLOOD BANK

## 2017-11-10 MED ORDER — EPOETIN ALFA 40000 UNIT/ML IJ SOLN
40000.0000 [IU] | Freq: Once | INTRAMUSCULAR | Status: AC
  Administered 2017-11-10: 40000 [IU] via SUBCUTANEOUS

## 2017-11-11 ENCOUNTER — Other Ambulatory Visit: Payer: Self-pay | Admitting: Family Medicine

## 2017-11-11 DIAGNOSIS — J329 Chronic sinusitis, unspecified: Secondary | ICD-10-CM

## 2017-11-11 MED ORDER — AMOXICILLIN-POT CLAVULANATE 875-125 MG PO TABS: 1.0000 | ORAL_TABLET | Freq: Two times a day (BID) | ORAL | 0 refills | Status: DC

## 2017-11-14 DIAGNOSIS — M1711 Unilateral primary osteoarthritis, right knee: Secondary | ICD-10-CM | POA: Diagnosis not present

## 2017-11-15 ENCOUNTER — Other Ambulatory Visit: Payer: Self-pay | Admitting: Family Medicine

## 2017-11-15 DIAGNOSIS — R51 Headache: Principal | ICD-10-CM

## 2017-11-15 DIAGNOSIS — R519 Headache, unspecified: Secondary | ICD-10-CM

## 2017-11-17 NOTE — Progress Notes (Deleted)
Patient: Amy [name] Female    DOB: [birth year]   67 y.o.   MRN: [id number] Visit Date: 11/17/2017  Today's Provider: Lelon Huh, MD   No chief complaint on file.  Subjective:    HPI   Hypertension, follow-up:  BP Readings from Last 3 Encounters:  11/10/17 (!) 153/64  10/19/17 (!) 150/68  09/29/17 140/65    She was last seen for hypertension 1 months ago.  BP at that visit was 150/70. Management since that visit includes; increased Doxazosin to 8 mg qd and advised to follow up in 1 month.She reports {excellent/good/fair/poor:19665} compliance with treatment. She {ACTION; IS/IS [id number]} having side effects. *** She {is/is not:9024} exercising. She {is/is not:9024} adherent to low salt diet.   Outside blood pressures are ***. She is experiencing {Symptoms; cardiac:12860}.  Patient denies {Symptoms; cardiac:12860}.   Cardiovascular risk factors include {cv risk factors:510}.  Use of agents associated with hypertension: {bp agents assoc with hypertension:511::"none"}.   ------------------------------------------------------------------------    Allergies  Allergen Reactions  . Cephalosporins   . Clarithromycin   . Lunesta  [Eszopiclone]     Heart racing  . Penicillin V     Has patient had a PCN reaction causing immediate rash, facial/tongue/throat swelling, SOB or lightheadedness with hypotension: Yes Has patient had a PCN reaction causing severe rash involving mucus membranes or skin necrosis: No Has patient had a PCN reaction that required hospitalization No Has patient had a PCN reaction occurring within the last 10 years: No If all of the above answers are "NO", then may proceed with Cephalosporin use.  . Sulfa Antibiotics      Current Outpatient Medications:  .  amLODipine (NORVASC) 5 MG tablet, TAKE 1 TABLET(5 MG) BY MOUTH DAILY, Disp: 30 tablet, Rfl: 2 .  amoxicillin (AMOXIL) 875 MG tablet, TAKE 1 TABLET(875 MG) BY MOUTH TWICE DAILY, Disp: 20  tablet, Rfl: 0 .  amoxicillin-clavulanate (AUGMENTIN) 875-125 MG tablet, Take 1 tablet by mouth 2 (two) times daily. Take with amoxicillin 875, Disp: 20 tablet, Rfl: 0 .  aspirin EC 81 MG EC tablet, Take 1 tablet (81 mg total) by mouth daily., Disp: , Rfl:  .  azelastine (ASTELIN) 0.1 % nasal spray, Place 2 sprays into both nostrils 2 (two) times daily. Use in each nostril as directed, Disp: 30 mL, Rfl: 12 .  buPROPion (WELLBUTRIN) 75 MG tablet, Take 1 tablet (75 mg total) by mouth 2 (two) times daily., Disp: 180 tablet, Rfl: 4 .  butalbital-acetaminophen-caffeine (FIORICET, ESGIC) 50-325-40 MG tablet, TAKE 1 TO 2 TABLETS BY MOUTH EVERY 4 TO 6 HOURS AS NEEDED FOR PAIN. MAX OF 8 PER DAY, Disp: 150 tablet, Rfl: 5 .  carvedilol (COREG) 25 MG tablet, TAKE 1 TABLET(25 MG) BY MOUTH TWICE DAILY WITH A MEAL, Disp: 60 tablet, Rfl: 5 .  cloNIDine (CATAPRES) 0.1 MG tablet, TAKE 1 TABLET(0.1 MG) BY MOUTH TWICE DAILY, Disp: 60 tablet, Rfl: 0 .  doxazosin (CARDURA) 8 MG tablet, Take 1 tablet (8 mg total) by mouth daily., Disp: 30 tablet, Rfl: 1 .  fluticasone (FLONASE) 50 MCG/ACT nasal spray, Place 1-2 sprays into both nostrils daily., Disp: 16 g, Rfl: 5 .  furosemide (LASIX) 20 MG tablet, furosemide 20 mg tablet  TK 1 T PO QD PRF SWELLING, Disp: , Rfl:  .  hydrALAZINE (APRESOLINE) 50 MG tablet, Take 0.5 tablets (25 mg total) by mouth 3 (three) times daily. if your Systolic JO>878 or Diastolic MV>672, Disp: 3 tablet, Rfl: 1 .  LORazepam (ATIVAN) 1 MG tablet, TAKE 1 TABLET BY MOUTH TWICE DAILY AS NEEDED, Disp: 180 tablet, Rfl: 1 .  pantoprazole (PROTONIX) 40 MG tablet, Take 1 tablet (40 mg total) by mouth daily., Disp: 30 tablet, Rfl: 5 .  ranitidine (ZANTAC) 150 MG tablet, Take 150 mg by mouth 2 (two) times daily., Disp: , Rfl:  .  valACYclovir (VALTREX) 1000 MG tablet, 2 tablets twice a day for 1 day as needed for cold sores, Disp: 20 tablet, Rfl: 1 .  zolpidem (AMBIEN) 10 MG tablet, TAKE 1 TABLET BY MOUTH AT  BEDTIME AS NEEDED, Disp: 30 tablet, Rfl: 5  Review of Systems  Constitutional: Negative for appetite change, chills, fatigue and fever.  Respiratory: Negative for chest tightness and shortness of breath.   Cardiovascular: Negative for chest pain and palpitations.  Gastrointestinal: Negative for abdominal pain, nausea and vomiting.  Neurological: Negative for dizziness and weakness.    Social History   Tobacco Use  . Smoking status: Never Smoker  . Smokeless tobacco: Never Used  Substance Use Topics  . Alcohol use: Yes    Alcohol/week: 0.0 oz    Comment: few glasses a wine/wk.   Objective:   There were no vitals taken for this visit. There were no vitals filed for this visit.   Physical Exam      Assessment & Plan:           Lelon Huh, MD  [facility]

## 2017-11-18 ENCOUNTER — Ambulatory Visit: Payer: Medicare Other | Admitting: Family Medicine

## 2017-11-24 ENCOUNTER — Inpatient Hospital Stay: Payer: Medicare Other

## 2017-11-24 DIAGNOSIS — S86812A Strain of other muscle(s) and tendon(s) at lower leg level, left leg, initial encounter: Secondary | ICD-10-CM | POA: Diagnosis not present

## 2017-11-24 DIAGNOSIS — S86919A Strain of unspecified muscle(s) and tendon(s) at lower leg level, unspecified leg, initial encounter: Secondary | ICD-10-CM | POA: Insufficient documentation

## 2017-11-28 DIAGNOSIS — S86812A Strain of other muscle(s) and tendon(s) at lower leg level, left leg, initial encounter: Secondary | ICD-10-CM | POA: Diagnosis not present

## 2017-11-30 ENCOUNTER — Other Ambulatory Visit: Payer: Self-pay | Admitting: Family Medicine

## 2017-11-30 DIAGNOSIS — M1712 Unilateral primary osteoarthritis, left knee: Secondary | ICD-10-CM | POA: Diagnosis not present

## 2017-11-30 DIAGNOSIS — J329 Chronic sinusitis, unspecified: Secondary | ICD-10-CM

## 2017-11-30 DIAGNOSIS — S83512A Sprain of anterior cruciate ligament of left knee, initial encounter: Secondary | ICD-10-CM | POA: Diagnosis not present

## 2017-11-30 DIAGNOSIS — S83232A Complex tear of medial meniscus, current injury, left knee, initial encounter: Secondary | ICD-10-CM | POA: Diagnosis not present

## 2017-11-30 DIAGNOSIS — S83412A Sprain of medial collateral ligament of left knee, initial encounter: Secondary | ICD-10-CM | POA: Diagnosis not present

## 2017-11-30 MED ORDER — AMOXICILLIN-POT CLAVULANATE 875-125 MG PO TABS: 1.0000 | ORAL_TABLET | Freq: Two times a day (BID) | ORAL | 0 refills | Status: DC

## 2017-12-08 DIAGNOSIS — M1712 Unilateral primary osteoarthritis, left knee: Secondary | ICD-10-CM | POA: Diagnosis not present

## 2017-12-12 ENCOUNTER — Other Ambulatory Visit: Payer: Self-pay | Admitting: Cardiovascular Disease

## 2017-12-12 ENCOUNTER — Ambulatory Visit (INDEPENDENT_AMBULATORY_CARE_PROVIDER_SITE_OTHER): Payer: Medicare Other

## 2017-12-12 ENCOUNTER — Other Ambulatory Visit: Payer: Self-pay | Admitting: Family Medicine

## 2017-12-12 VITALS — BP 134/62 | HR 67 | Temp 98.2°F | Ht 64.0 in | Wt 105.8 lb

## 2017-12-12 DIAGNOSIS — Z0000 Encounter for general adult medical examination without abnormal findings: Secondary | ICD-10-CM | POA: Diagnosis not present

## 2017-12-12 DIAGNOSIS — G4700 Insomnia, unspecified: Secondary | ICD-10-CM

## 2017-12-12 NOTE — Patient Instructions (Signed)
Amy [name] , Thank you for taking time to come for your Medicare Wellness Visit. I appreciate your ongoing commitment to your health goals. Please review the following plan we discussed and let me know if I can assist you in the future.   Screening recommendations/referrals: Colonoscopy: Up to date Mammogram: Up to date Bone Density: Up to date Recommended yearly ophthalmology/optometry visit for glaucoma screening and checkup Recommended yearly dental visit for hygiene and checkup  Vaccinations: Influenza vaccine: Up to date Pneumococcal vaccine: Up to date Tdap vaccine: Up to date Shingles vaccine: Pt declines today.     Advanced directives: Please bring a copy of your POA (Power of Attorney) and/or Living Will to your next appointment.   Conditions/risks identified: Recommend once healing from knee surgery to start walking 3 days a week for at least 30 minutes at a time.   Next appointment: 12/21/17 @ 2 PM with Dr Caryn Section.    Preventive Care 69 Years and Older, Female Preventive care refers to lifestyle choices and visits with your health care provider that can promote health and wellness. What does preventive care include?  A yearly physical exam. This is also called an annual well check.  Dental exams once or twice a year.  Routine eye exams. Ask your health care provider how often you should have your eyes checked.  Personal lifestyle choices, including:  Daily care of your teeth and gums.  Regular physical activity.  Eating a healthy diet.  Avoiding tobacco and drug use.  Limiting alcohol use.  Practicing safe sex.  Taking low-dose aspirin every day.  Taking vitamin and mineral supplements as recommended by your health care provider. What happens during an annual well check? The services and screenings done by your health care provider during your annual well check will depend on your age, overall health, lifestyle risk factors, and family history of  disease. Counseling  Your health care provider may ask you questions about your:  Alcohol use.  Tobacco use.  Drug use.  Emotional well-being.  Home and relationship well-being.  Sexual activity.  Eating habits.  History of falls.  Memory and ability to understand (cognition).  Work and work Statistician.  Reproductive health. Screening  You may have the following tests or measurements:  Height, weight, and BMI.  Blood pressure.  Lipid and cholesterol levels. These may be checked every 5 years, or more frequently if you are over 71 years old.  Skin check.  Lung cancer screening. You may have this screening every year starting at age 16 if you have a 30-pack-year history of smoking and currently smoke or have quit within the past 15 years.  Fecal occult blood test (FOBT) of the stool. You may have this test every year starting at age 52.  Flexible sigmoidoscopy or colonoscopy. You may have a sigmoidoscopy every 5 years or a colonoscopy every 10 years starting at age 65.  Hepatitis C blood test.  Hepatitis B blood test.  Sexually transmitted disease (STD) testing.  Diabetes screening. This is done by checking your blood sugar (glucose) after you have not eaten for a while (fasting). You may have this done every 1-3 years.  Bone density scan. This is done to screen for osteoporosis. You may have this done starting at age 20.  Mammogram. This may be done every 1-2 years. Talk to your health care provider about how often you should have regular mammograms. Talk with your health care provider about your test results, treatment options, and if necessary,  the need for more tests. Vaccines  Your health care provider may recommend certain vaccines, such as:  Influenza vaccine. This is recommended every year.  Tetanus, diphtheria, and acellular pertussis (Tdap, Td) vaccine. You may need a Td booster every 10 years.  Zoster vaccine. You may need this after age  22.  Pneumococcal 13-valent conjugate (PCV13) vaccine. One dose is recommended after age 57.  Pneumococcal polysaccharide (PPSV23) vaccine. One dose is recommended after age 3. Talk to your health care provider about which screenings and vaccines you need and how often you need them. This information is not intended to replace advice given to you by your health care provider. Make sure you discuss any questions you have with your health care provider. Document Released: 05/16/2015 Document Revised: 01/07/2016 Document Reviewed: 02/18/2015 Elsevier Interactive Patient Education  2017 [place] Prevention in the Home Falls can cause injuries. They can happen to people of all ages. There are many things you can do to make your home safe and to help prevent falls. What can I do on the outside of my home?  Regularly fix the edges of walkways and driveways and fix any cracks.  Remove anything that might make you trip as you walk through a door, such as a raised step or threshold.  Trim any bushes or trees on the path to your home.  Use bright outdoor lighting.  Clear any walking paths of anything that might make someone trip, such as rocks or tools.  Regularly check to see if handrails are loose or broken. Make sure that both sides of any steps have handrails.  Any raised decks and porches should have guardrails on the edges.  Have any leaves, snow, or ice cleared regularly.  Use sand or salt on walking paths during winter.  Clean up any spills in your garage right away. This includes oil or grease spills. What can I do in the bathroom?  Use night lights.  Install grab bars by the toilet and in the tub and shower. Do not use towel bars as grab bars.  Use non-skid mats or decals in the tub or shower.  If you need to sit down in the shower, use a plastic, non-slip stool.  Keep the floor dry. Clean up any water that spills on the floor as soon as it happens.  Remove  soap buildup in the tub or shower regularly.  Attach bath mats securely with double-sided non-slip rug tape.  Do not have throw rugs and other things on the floor that can make you trip. What can I do in the bedroom?  Use night lights.  Make sure that you have a light by your bed that is easy to reach.  Do not use any sheets or blankets that are too big for your bed. They should not hang down onto the floor.  Have a firm chair that has side arms. You can use this for support while you get dressed.  Do not have throw rugs and other things on the floor that can make you trip. What can I do in the kitchen?  Clean up any spills right away.  Avoid walking on wet floors.  Keep items that you use a lot in easy-to-reach places.  If you need to reach something above you, use a strong step stool that has a grab bar.  Keep electrical cords out of the way.  Do not use floor polish or wax that makes floors slippery. If you must use  wax, use non-skid floor wax.  Do not have throw rugs and other things on the floor that can make you trip. What can I do with my stairs?  Do not leave any items on the stairs.  Make sure that there are handrails on both sides of the stairs and use them. Fix handrails that are broken or loose. Make sure that handrails are as long as the stairways.  Check any carpeting to make sure that it is firmly attached to the stairs. Fix any carpet that is loose or worn.  Avoid having throw rugs at the top or bottom of the stairs. If you do have throw rugs, attach them to the floor with carpet tape.  Make sure that you have a light switch at the top of the stairs and the bottom of the stairs. If you do not have them, ask someone to add them for you. What else can I do to help prevent falls?  Wear shoes that:  Do not have high heels.  Have rubber bottoms.  Are comfortable and fit you well.  Are closed at the toe. Do not wear sandals.  If you use a  stepladder:  Make sure that it is fully opened. Do not climb a closed stepladder.  Make sure that both sides of the stepladder are locked into place.  Ask someone to hold it for you, if possible.  Clearly mark and make sure that you can see:  Any grab bars or handrails.  First and last steps.  Where the edge of each step is.  Use tools that help you move around (mobility aids) if they are needed. These include:  Canes.  Walkers.  Scooters.  Crutches.  Turn on the lights when you go into a dark area. Replace any light bulbs as soon as they burn out.  Set up your furniture so you have a clear path. Avoid moving your furniture around.  If any of your floors are uneven, fix them.  If there are any pets around you, be aware of where they are.  Review your medicines with your doctor. Some medicines can make you feel dizzy. This can increase your chance of falling. Ask your doctor what other things that you can do to help prevent falls. This information is not intended to replace advice given to you by your health care provider. Make sure you discuss any questions you have with your health care provider. Document Released: 02/13/2009 Document Revised: 09/25/2015 Document Reviewed: 05/24/2014 Elsevier Interactive Patient Education  2017 [employer].

## 2017-12-12 NOTE — Progress Notes (Signed)
Subjective:   Amy [name] is a 67 y.o. female who presents for Medicare Annual (Subsequent) preventive examination.  Review of Systems:  N/A  Cardiac Risk Factors include: advanced age (>57men, >59 women);hypertension     Objective:     Vitals: BP 134/62 (BP Location: Right Arm)   Pulse 67   Temp 98.2 F (36.8 C) (Oral)   Ht 5\' 4"  (1.626 m)   Wt 105 lb 12.8 oz (48 kg)   BMI 18.16 kg/m   Body mass index is 18.16 kg/m.  Advanced Directives 12/12/2017 08/11/2017 06/20/2017 05/23/2017 05/09/2017 01/18/2017 12/27/2016  Does Patient Have a Medical Advance Directive? Yes Yes Yes Yes Yes Yes Yes  Type of Paramedic of [place];Living will [place];Living will Living will Living will Living will Living will Living will  Does patient want to make changes to medical advance directive? - - - - No - Patient declined - No - Patient declined  Copy of [place] in Chart? No - copy requested No - copy requested - - - - No - copy requested  Would patient like information on creating a medical advance directive? - - - - - - -    Tobacco Social History   Tobacco Use  Smoking Status Never Smoker  Smokeless Tobacco Never Used     Counseling given: Not Answered   Clinical Intake:  Pre-visit preparation completed: Yes  Pain : 0-10 Pain Score: 7  Pain Type: Acute pain Pain Location: Knee Pain Orientation: Left Pain Descriptors / Indicators: Throbbing Pain Onset: 1 to 4 weeks ago Pain Frequency: Constant     Nutritional Status: BMI <19  Underweight Diabetes: No  How often do you need to have someone help you when you read instructions, pamphlets, or other written materials from your doctor or pharmacy?: 1 - Never  Interpreter Needed?: No  Information entered by :: [facility], LPN  Past Medical History:  Diagnosis Date  . Anemia    a. 08/2016 Guaiac + stool. EGD w/ gastritis.  . Anxiety   . CKD (chronic kidney  disease), stage III ([place])   . Colon polyp    a. 08/2016 Colonoscopy.  . Gastritis    a. 08/2016 EGD: Gastritis-->PPI.  [name] GERD (gastroesophageal reflux disease)   . History of chicken pox   . History of measles as a child   . Hypertension   . Hyponatremia    a. 08/2016 in setting of HCTZ Rx.  . Multiple thyroid nodules    Past Surgical History:  Procedure Laterality Date  . ABDOMINAL HYSTERECTOMY  2003   due to heavy periods and clotting during menses  . BREAST LUMPECTOMY Left 1990  . CHOLECYSTECTOMY  2010  . COLONOSCOPY WITH PROPOFOL N/A 09/07/2016   Procedure: COLONOSCOPY WITH PROPOFOL;  Surgeon: Lucilla Lame, MD;  Location: [facility] ENDOSCOPY;  Service: Endoscopy;  Laterality: N/A;  . DILATION AND CURETTAGE OF UTERUS  1985  . ESOPHAGOGASTRODUODENOSCOPY (EGD) WITH PROPOFOL N/A 09/07/2016   Procedure: ESOPHAGOGASTRODUODENOSCOPY (EGD) WITH PROPOFOL;  Surgeon: Lucilla Lame, MD;  Location: [facility] ENDOSCOPY;  Service: Endoscopy;  Laterality: N/A;  . GALLBLADDER SURGERY  2012  . KNEE SURGERY     Dr. Tamala Julian  . MRI, Lumbar spine  07/23/2009   Multilevel annular bulge and disc protrusion at L2-L3, L3-L4, L4-L5, and L5-S1  . TONSILLECTOMY    . TONSILLECTOMY AND ADENOIDECTOMY  1970  . UPPER GI ENDOSCOPY  03/02/2006   H. Pylori negative; Dr. Allen Norris  Family History  Problem Relation Age of Onset  . Pancreatic cancer Father   . Congestive Heart Failure Maternal Grandmother   . Cancer Paternal Grandfather   . Heart disease Maternal Uncle    Social History   Socioeconomic History  . Marital status: Married    Spouse name: Not on file  . Number of children: 1  . Years of education: Not on file  . Highest education level: Bachelor's degree (e.g., BA, AB, BS)  Occupational History  . Occupation: Retired  Scientific laboratory technician  . Financial resource strain: Not hard at all  . Food insecurity:    Worry: Never true    Inability: Never true  . Transportation needs:    Medical: No    Non-medical: Patient  refused  Tobacco Use  . Smoking status: Never Smoker  . Smokeless tobacco: Never Used  Substance and Sexual Activity  . Alcohol use: Yes    Alcohol/week: 0.0 - 2.0 standard drinks  . Drug use: No  . Sexual activity: Yes  Lifestyle  . Physical activity:    Days per week: Not on file    Minutes per session: Not on file  . Stress: Not at all  Relationships  . Social connections:    Talks on phone: Not on file    Gets together: Not on file    Attends religious service: Not on file    Active member of club or organization: Not on file    Attends meetings of clubs or organizations: Not on file    Relationship status: Not on file  Other Topics Concern  . Not on file  Social History Narrative   Lives in [place] with husband.  Does not routinely exercise.    Outpatient Encounter Medications as of 12/12/2017  Medication Sig  . amLODipine (NORVASC) 5 MG tablet TAKE 1 TABLET(5 MG) BY MOUTH DAILY  . aspirin EC 81 MG EC tablet Take 1 tablet (81 mg total) by mouth daily. (Patient taking differently: Take 81 mg by mouth daily. )  . buPROPion (WELLBUTRIN) 75 MG tablet Take 1 tablet (75 mg total) by mouth 2 (two) times daily. (Patient taking differently: Take 150 mg by mouth daily. )  . butalbital-acetaminophen-caffeine (FIORICET, ESGIC) 50-325-40 MG tablet TAKE 1 TO 2 TABLETS BY MOUTH EVERY 4 TO 6 HOURS AS NEEDED FOR PAIN. MAX OF 8 PER DAY  . carvedilol (COREG) 25 MG tablet TAKE 1 TABLET(25 MG) BY MOUTH TWICE DAILY WITH A MEAL  . cloNIDine (CATAPRES) 0.1 MG tablet TAKE 1 TABLET(0.1 MG) BY MOUTH TWICE DAILY  . furosemide (LASIX) 20 MG tablet furosemide 20 mg tablet  TK 1 T PO QD PRF SWELLING  . hydrALAZINE (APRESOLINE) 50 MG tablet Take 0.5 tablets (25 mg total) by mouth 3 (three) times daily. if your Systolic EQ>683 or Diastolic MH>962  . LORazepam (ATIVAN) 1 MG tablet TAKE 1 TABLET BY MOUTH TWICE DAILY AS NEEDED  . pantoprazole (PROTONIX) 40 MG tablet Take 1 tablet (40 mg total) by mouth  daily.  . quinapril (ACCUPRIL) 5 MG tablet Take 5 mg by mouth 2 (two) times daily.   . ranitidine (ZANTAC) 150 MG tablet Take 150 mg by mouth 2 (two) times daily.  . valACYclovir (VALTREX) 1000 MG tablet 2 tablets twice a day for 1 day as needed for cold sores  . zolpidem (AMBIEN) 10 MG tablet TAKE 1 TABLET BY MOUTH AT BEDTIME AS NEEDED  . amoxicillin (AMOXIL) 875 MG tablet TAKE 1 TABLET(875 MG) BY MOUTH TWICE  DAILY (Patient not taking: Reported on 12/12/2017)  . amoxicillin-clavulanate (AUGMENTIN) 875-125 MG tablet Take 1 tablet by mouth 2 (two) times daily. Take with amoxicillin 875 (Patient not taking: Reported on 12/12/2017)  . azelastine (ASTELIN) 0.1 % nasal spray Place 2 sprays into both nostrils 2 (two) times daily. Use in each nostril as directed (Patient not taking: Reported on 12/12/2017)  . doxazosin (CARDURA) 8 MG tablet Take 1 tablet (8 mg total) by mouth daily. (Patient not taking: Reported on 12/12/2017)  . fluticasone (FLONASE) 50 MCG/ACT nasal spray Place 1-2 sprays into both nostrils daily. (Patient not taking: Reported on 12/12/2017)   No facility-administered encounter medications on file as of 12/12/2017.     Activities of Daily Living In your present state of health, do you have any difficulty performing the following activities: 12/12/2017  Vision? N  Difficulty concentrating or making decisions? N  Walking or climbing stairs? N  Dressing or bathing? N  Doing errands, shopping? N  Preparing Food and eating ? N  Using the Toilet? N  In the past six months, have you accidently leaked urine? N  Do you have problems with loss of bowel control? N  Managing your Medications? N  Managing your Finances? N  Housekeeping or managing your Housekeeping? N  Some recent data might be hidden    Patient Care Team: [name], MD as PCP - General (Family Medicine) Thornton Park, MD as Referring Physician (Orthopedic Surgery) Lloyd Huger, MD as Consulting Physician  (Oncology) Murlean Iba, MD as Referring Physician (Nephrology)    Assessment:   This is a routine wellness examination for Alila.  Exercise Activities and Dietary recommendations Current Exercise Habits: The patient does not participate in regular exercise at present, Exercise limited by: orthopedic condition(s)  Goals    . Exercise 3x per week (30 min per time)     Continue drinking 6-8 glasses of water a day.    . Exercise 3x per week (30 min per time)     Recommend once healing from knee surgery to start walking 3 days a week for at least 30 minutes at a time.        Fall Risk Fall Risk  12/12/2017 05/10/2017 12/10/2016 12/11/2015  Falls in the past year? No No No No   Is the patient's home free of loose throw rugs in walkways, pet beds, electrical cords, etc?   yes      Grab bars in the bathroom? no      Handrails on the stairs?   yes      Adequate lighting?   yes  Timed Get Up and Go performed: N/A  Depression Screen PHQ 2/9 Scores 12/12/2017 12/10/2016 12/11/2015 12/04/2014  PHQ - 2 Score 0 0 0 0  PHQ- 9 Score - - 0 0     Cognitive Function: Pt declined screening today.      6CIT Screen 12/10/2016  What Year? 0 points  What month? 0 points  What time? 0 points  Count back from 20 0 points  Months in reverse 0 points  Repeat phrase 0 points  Total Score 0    Immunization History  Administered Date(s) Administered  . Influenza, High Dose Seasonal PF 01/21/2015, 02/04/2017  . Pneumococcal Conjugate-13 12/04/2014  . Pneumococcal Polysaccharide-23 12/13/2016  . Td 08/15/2000  . Tdap 02/02/2012  . Zoster 01/20/2011    Qualifies for Shingles Vaccine? Due for Shingles vaccine. Declined my offer to administer today. Education has been provided regarding the  importance of this vaccine. Pt has been advised to call her insurance company to determine her out of pocket expense. Advised she may also receive this vaccine at her local pharmacy or Health Dept. Verbalized  acceptance and understanding.  Screening Tests Health Maintenance  Topic Date Due  . MAMMOGRAM  08/04/2017  . INFLUENZA VACCINE  12/01/2017  . DEXA SCAN  12/25/2017  . COLONOSCOPY  09/08/2019  . TETANUS/TDAP  02/01/2022  . Hepatitis C Screening  Completed  . PNA vac Low Risk Adult  Completed    Cancer Screenings: Lung: Low Dose CT Chest recommended if Age 72-80 years, 30 pack-year currently smoking OR have quit w/in 15years. Patient does not qualify. Breast:  Up to date on Mammogram? No, pt to set up apt this year.  Up to date of Bone Density/Dexa? Yes Colorectal: Up to date   Additional Screenings:  Hepatitis C Screening: Up to date     Plan:  I have personally reviewed and addressed the Medicare Annual Wellness questionnaire and have noted the following in the patient's chart:  A. Medical and social history B. Use of alcohol, tobacco or illicit drugs  C. Current medications and supplements D. Functional ability and status E.  Nutritional status F.  Physical activity G. Advance directives H. List of other physicians I.  Hospitalizations, surgeries, and ER visits in previous 12 months J.  [place] such as hearing and vision if needed, cognitive and depression L. Referrals and appointments - none  In addition, I have reviewed and discussed with patient certain preventive protocols, quality metrics, and best practice recommendations. A written personalized care plan for preventive services as well as general preventive health recommendations were provided to patient.  See attached scanned questionnaire for additional information.   Signed,  Fabio Neighbors, LPN Nurse Health Advisor   Nurse Recommendations: Pt to set up a mammogram this year.

## 2017-12-15 ENCOUNTER — Other Ambulatory Visit: Payer: Medicare Other

## 2017-12-15 ENCOUNTER — Ambulatory Visit: Payer: Medicare Other | Admitting: Oncology

## 2017-12-15 ENCOUNTER — Ambulatory Visit: Payer: Medicare Other

## 2017-12-15 DIAGNOSIS — M1712 Unilateral primary osteoarthritis, left knee: Secondary | ICD-10-CM | POA: Diagnosis not present

## 2017-12-20 NOTE — Progress Notes (Signed)
[place]  [phone]) [phone] Fax:(336) [phone]  ID: Amy [name] OB: 1951/04/19  MR#: [id number]  RFF#:[id number]  Patient Care Team: [name], MD as PCP - General (Family Medicine) Thornton Park, MD as Referring Physician (Orthopedic Surgery) Lloyd Huger, MD as Consulting Physician (Oncology) Murlean Iba, MD as Referring Physician (Nephrology)  CHIEF COMPLAINT:  Anemia in stage IV chronic kidney disease.  INTERVAL HISTORY: Patient returns to clinic today for repeat laboratory work, further evaluation, and continuation of Procrit.  She has noted increasing weakness and fatigue over the past several weeks.  She otherwise feels well.  She has no neurologic complaints.  She denies any recent fevers or illnesses.  She has a good appetite and her weight has remained stable.  She has no chest pain or shortness of breath. She denies any nausea, vomiting, constipation, or diarrhea.  She denies any melena or hematochezia.  She has no urinary complaints.  Patient offers no further specific complaints today.  REVIEW OF SYSTEMS:   Review of Systems  Constitutional: Positive for malaise/fatigue. Negative for fever and weight loss.  Respiratory: Negative.  Negative for cough and shortness of breath.   Cardiovascular: Negative.  Negative for chest pain and leg swelling.  Gastrointestinal: Negative.  Negative for abdominal pain, blood in stool and melena.  Genitourinary: Negative.  Negative for dysuria and hematuria.  Musculoskeletal: Negative.  Negative for back pain.  Skin: Negative.  Negative for rash.  Neurological: Positive for weakness. Negative for sensory change and focal weakness.  Psychiatric/Behavioral: Negative.  The patient is not nervous/anxious.     As per HPI. Otherwise, a complete review of systems is negative.  PAST MEDICAL HISTORY: Past Medical History:  Diagnosis Date  . Anemia    a. 08/2016 Guaiac + stool. EGD w/ gastritis.  .  Anxiety   . CKD (chronic kidney disease), stage III ([place])   . Colon polyp    a. 08/2016 Colonoscopy.  . Gastritis    a. 08/2016 EGD: Gastritis-->PPI.  [name] GERD (gastroesophageal reflux disease)   . History of chicken pox   . History of measles as a child   . Hypertension   . Hyponatremia    a. 08/2016 in setting of HCTZ Rx.  . Multiple thyroid nodules     PAST SURGICAL HISTORY: Past Surgical History:  Procedure Laterality Date  . ABDOMINAL HYSTERECTOMY  2003   due to heavy periods and clotting during menses  . BREAST LUMPECTOMY Left 1990  . CHOLECYSTECTOMY  2010  . COLONOSCOPY WITH PROPOFOL N/A 09/07/2016   Procedure: COLONOSCOPY WITH PROPOFOL;  Surgeon: Lucilla Lame, MD;  Location: [facility] ENDOSCOPY;  Service: Endoscopy;  Laterality: N/A;  . DILATION AND CURETTAGE OF UTERUS  1985  . ESOPHAGOGASTRODUODENOSCOPY (EGD) WITH PROPOFOL N/A 09/07/2016   Procedure: ESOPHAGOGASTRODUODENOSCOPY (EGD) WITH PROPOFOL;  Surgeon: Lucilla Lame, MD;  Location: [facility] ENDOSCOPY;  Service: Endoscopy;  Laterality: N/A;  . GALLBLADDER SURGERY  2012  . KNEE SURGERY     Dr. Tamala Julian  . MRI, Lumbar spine  07/23/2009   Multilevel annular bulge and disc protrusion at L2-L3, L3-L4, L4-L5, and L5-S1  . TONSILLECTOMY    . TONSILLECTOMY AND ADENOIDECTOMY  1970  . UPPER GI ENDOSCOPY  03/02/2006   H. Pylori negative; Dr. Allen Norris    FAMILY HISTORY: Family History  Problem Relation Age of Onset  . Pancreatic cancer Father   . Congestive Heart Failure Maternal Grandmother   . Cancer Paternal Grandfather   . Heart disease Maternal Uncle  ADVANCED DIRECTIVES (Y/N):  N  HEALTH MAINTENANCE: Social History   Tobacco Use  . Smoking status: Never Smoker  . Smokeless tobacco: Never Used  Substance Use Topics  . Alcohol use: Yes    Alcohol/week: 0.0 - 2.0 standard drinks  . Drug use: No     Colonoscopy:  PAP:  Bone density:  Lipid panel:  Allergies  Allergen Reactions  . Cephalosporins   . Clarithromycin   .  Lunesta  [Eszopiclone]     Heart racing  . Penicillin V     Has patient had a PCN reaction causing immediate rash, facial/tongue/throat swelling, SOB or lightheadedness with hypotension: Yes Has patient had a PCN reaction causing severe rash involving mucus membranes or skin necrosis: No Has patient had a PCN reaction that required hospitalization No Has patient had a PCN reaction occurring within the last 10 years: No If all of the above answers are "NO", then may proceed with Cephalosporin use.  . Sulfa Antibiotics     Current Outpatient Medications  Medication Sig Dispense Refill  . amLODipine (NORVASC) 5 MG tablet TAKE 1 TABLET(5 MG) BY MOUTH DAILY 90 tablet 4  . buPROPion (WELLBUTRIN) 75 MG tablet Take 1 tablet (75 mg total) by mouth 2 (two) times daily. (Patient taking differently: Take 150 mg by mouth daily. ) 180 tablet 4  . butalbital-acetaminophen-caffeine (FIORICET, ESGIC) 50-325-40 MG tablet TAKE 1 TO 2 TABLETS BY MOUTH EVERY 4 TO 6 HOURS AS NEEDED FOR PAIN. MAX OF 8 PER DAY 150 tablet 5  . carvedilol (COREG) 25 MG tablet TAKE 1 TABLET(25 MG) BY MOUTH TWICE DAILY WITH A MEAL 60 tablet 5  . LORazepam (ATIVAN) 1 MG tablet TAKE 1 TABLET BY MOUTH TWICE DAILY AS NEEDED 180 tablet 1  . quinapril (ACCUPRIL) 5 MG tablet Take 5 mg by mouth 2 (two) times daily.     . ranitidine (ZANTAC) 150 MG tablet Take 150 mg by mouth 2 (two) times daily.    [name] zolpidem (AMBIEN) 10 MG tablet TAKE 1 TABLET BY MOUTH AT BEDTIME AS NEEDED 30 tablet 0  . aspirin EC 81 MG EC tablet Take 1 tablet (81 mg total) by mouth daily. (Patient not taking: Reported on 12/22/2017)    . azelastine (ASTELIN) 0.1 % nasal spray Place 2 sprays into both nostrils 2 (two) times daily. Use in each nostril as directed (Patient not taking: Reported on 12/22/2017) 30 mL 12  . cloNIDine (CATAPRES) 0.1 MG tablet TAKE 1 TABLET(0.1 MG) BY MOUTH TWICE DAILY (Patient not taking: Reported on 12/22/2017) 60 tablet 0  . furosemide (LASIX) 20 MG  tablet furosemide 20 mg tablet  TK 1 T PO QD PRF SWELLING    . hydrALAZINE (APRESOLINE) 50 MG tablet Take 0.5 tablets (25 mg total) by mouth 3 (three) times daily. if your Systolic IO>962 or Diastolic XB>284 (Patient not taking: Reported on 12/22/2017) 3 tablet 1  . pantoprazole (PROTONIX) 40 MG tablet Take 1 tablet (40 mg total) by mouth daily. (Patient not taking: Reported on 12/22/2017) 30 tablet 5  . valACYclovir (VALTREX) 1000 MG tablet 2 tablets twice a day for 1 day as needed for cold sores (Patient not taking: Reported on 12/22/2017) 20 tablet 1   No current facility-administered medications for this visit.     OBJECTIVE: Vitals:   12/22/17 1444  BP: 137/74  Pulse: 74  Resp: 18  Temp: (!) 95.6 F (35.3 C)     Body mass index is 18.73 kg/m.    ECOG  FS:0 - Asymptomatic  General: Well-developed, well-nourished, no acute distress. Eyes: Pink conjunctiva, anicteric sclera. HEENT: Normocephalic, moist mucous membranes. Lungs: Clear to auscultation bilaterally. Heart: Regular rate and rhythm. No rubs, murmurs, or gallops. Abdomen: Soft, nontender, nondistended. No organomegaly noted, normoactive bowel sounds. Musculoskeletal: No edema, cyanosis, or clubbing. Neuro: Alert, answering all questions appropriately. Cranial nerves grossly intact. Skin: No rashes or petechiae noted. Psych: Normal affect.   LAB RESULTS:  Lab Results  Component Value Date   NA 135 12/21/2017   K 5.2 12/21/2017   CL 106 12/21/2017   CO2 14 (L) 12/21/2017   GLUCOSE 88 12/21/2017   BUN 44 (H) 12/21/2017   CREATININE 2.96 (H) 12/21/2017   CALCIUM 9.0 12/21/2017   PROT 5.5 (L) 12/21/2017   ALBUMIN 3.8 12/21/2017   AST 15 12/21/2017   ALT 12 12/21/2017   ALKPHOS 131 (H) 12/21/2017   BILITOT <0.2 12/21/2017   GFRNONAA 16 (L) 12/21/2017   GFRAA 18 (L) 12/21/2017    Lab Results  Component Value Date   WBC 4.8 12/22/2017   NEUTROABS 3.5 12/22/2017   HGB 7.2 (L) 12/22/2017   HCT 21.6 (L)  12/22/2017   MCV 94.0 12/22/2017   PLT 206 12/22/2017   Lab Results  Component Value Date   IRON 141 05/09/2017   TIBC 222 (L) 05/09/2017   IRONPCTSAT 63 (H) 05/09/2017   Lab Results  Component Value Date   FERRITIN 79 05/09/2017     STUDIES: No results found.  ASSESSMENT: Anemia in stage IV chronic kidney disease.  PLAN:    1. Anemia in stage IV chronic kidney disease: Patient's most recent iron stores from May 09, 2017 are within normal limits. Repeat in the next several months.  She had a normal colonoscopy and EGD on Sep 07, 2016. CT of the abdomen and pelvis was essentially within normal limits.  Patient has chronic renal insufficiency which is likely contributing to her anemia.  Previously, her erythropoietin level was inappropriately low.  Her reticulocyte count was also low, possibly indicating an underlying bone marrow disorder.  After discussion with the patient, will hold on doing a bone marrow biopsy at this time the patient expressed understanding that it might be necessary in the future.  Patient's hemoglobin has declined, but she has only received 1 injection of Procrit since May 2019.  Proceed with 40,000 units of Procrit today.  Return to clinic every 3 weeks for laboratory work and Procrit if her hemoglobin remains below 10.0 and her blood pressure remains under control.  Return to clinic in 4 months with repeat laboratory work and further evaluation. 2. Chronic renal insufficiency: Continue treatment and evaluation per nephrology. 3.  Hypertension: Patient's blood pressure is within normal limits today.  Proceed with Procrit as above.  Patient expressed understanding and was in agreement with this plan. She also understands that She can call clinic at any time with any questions, concerns, or complaints.   Lloyd Huger, MD   12/23/2017 2:16 PM

## 2017-12-21 ENCOUNTER — Encounter: Payer: Self-pay | Admitting: Family Medicine

## 2017-12-21 ENCOUNTER — Encounter: Payer: Medicare Other | Admitting: Family Medicine

## 2017-12-21 ENCOUNTER — Ambulatory Visit (INDEPENDENT_AMBULATORY_CARE_PROVIDER_SITE_OTHER): Payer: Medicare Other | Admitting: Family Medicine

## 2017-12-21 VITALS — BP 162/74 | HR 64 | Temp 97.7°F | Resp 16 | Ht 64.0 in | Wt 108.0 lb

## 2017-12-21 DIAGNOSIS — N184 Chronic kidney disease, stage 4 (severe): Secondary | ICD-10-CM | POA: Diagnosis not present

## 2017-12-21 DIAGNOSIS — E041 Nontoxic single thyroid nodule: Secondary | ICD-10-CM

## 2017-12-21 DIAGNOSIS — N2581 Secondary hyperparathyroidism of renal origin: Secondary | ICD-10-CM

## 2017-12-21 DIAGNOSIS — I10 Essential (primary) hypertension: Secondary | ICD-10-CM | POA: Diagnosis not present

## 2017-12-21 DIAGNOSIS — R350 Frequency of micturition: Secondary | ICD-10-CM | POA: Diagnosis not present

## 2017-12-21 DIAGNOSIS — D631 Anemia in chronic kidney disease: Secondary | ICD-10-CM | POA: Diagnosis not present

## 2017-12-21 LAB — POCT URINALYSIS DIPSTICK
APPEARANCE: NORMAL
Bilirubin, UA: NEGATIVE
Blood, UA: NEGATIVE
GLUCOSE UA: NEGATIVE
Ketones, UA: NEGATIVE
LEUKOCYTES UA: NEGATIVE
Nitrite, UA: NEGATIVE
ODOR: NORMAL
PROTEIN UA: POSITIVE — AB
SPEC GRAV UA: 1.015 (ref 1.010–1.025)
Urobilinogen, UA: 0.2 E.U./dL
pH, UA: 6 (ref 5.0–8.0)

## 2017-12-21 MED ORDER — AMLODIPINE BESYLATE 5 MG PO TABS: ORAL_TABLET | ORAL | 4 refills | Status: DC

## 2017-12-21 NOTE — Progress Notes (Addendum)
Patient: Amy [name] Female    DOB: 1951/01/12   67 y.o.   MRN: [id number] Visit Date: 12/21/2017  Today's Provider: Lelon Huh, MD   Chief Complaint  Patient presents with  . Follow-up  . Hypertension   Subjective:   Patient saw [facility] for AWV on 12/12/2017.  HPI   Hypertension, follow-up:  BP Readings from Last 3 Encounters:  12/21/17 (!) 172/76  12/12/17 134/62  11/10/17 (!) 153/64    She was last seen for hypertension 2 months ago.  BP at that visit was 150/70. Management since that visit includes; increased Doxazosin to 8 mg qd.She reports good compliance with treatment. However Dr. Candiss Norse changed her from doxazosin to low dose quinapril since her BP was still not controlled on doxazosin. She is tolerating well without adverse effects.   She is not having side effects. none She is not exercising. She is not adherent to low salt diet.   Outside blood pressures are not checking. She is experiencing none.  Patient denies none.   Cardiovascular risk factors include advanced age (older than 93 for men, 56 for women).  Use of agents associated with hypertension: none.   ---------------------------------------------------------------  She continues regular follow up with Dr. Grayland Ormond for normocytic anemia. She has follow up tomorrow. She still feels fatigued. No dyspnea.    Allergies  Allergen Reactions  . Cephalosporins   . Clarithromycin   . Lunesta  [Eszopiclone]     Heart racing  . Penicillin V     Has patient had a PCN reaction causing immediate rash, facial/tongue/throat swelling, SOB or lightheadedness with hypotension: Yes Has patient had a PCN reaction causing severe rash involving mucus membranes or skin necrosis: No Has patient had a PCN reaction that required hospitalization No Has patient had a PCN reaction occurring within the last 10 years: No If all of the above answers are "NO", then may proceed with Cephalosporin use.  . Sulfa  Antibiotics      Current Outpatient Medications:  .  amLODipine (NORVASC) 5 MG tablet, TAKE 1 TABLET(5 MG) BY MOUTH DAILY, Disp: 30 tablet, Rfl: 2 .  aspirin EC 81 MG EC tablet, Take 1 tablet (81 mg total) by mouth daily. (Patient taking differently: Take 81 mg by mouth daily. ), Disp: , Rfl:  .  azelastine (ASTELIN) 0.1 % nasal spray, Place 2 sprays into both nostrils 2 (two) times daily. Use in each nostril as directed, Disp: 30 mL, Rfl: 12 .  buPROPion (WELLBUTRIN) 75 MG tablet, Take 1 tablet (75 mg total) by mouth 2 (two) times daily. (Patient taking differently: Take 150 mg by mouth daily. ), Disp: 180 tablet, Rfl: 4 .  butalbital-acetaminophen-caffeine (FIORICET, ESGIC) 50-325-40 MG tablet, TAKE 1 TO 2 TABLETS BY MOUTH EVERY 4 TO 6 HOURS AS NEEDED FOR PAIN. MAX OF 8 PER DAY, Disp: 150 tablet, Rfl: 5 .  carvedilol (COREG) 25 MG tablet, TAKE 1 TABLET(25 MG) BY MOUTH TWICE DAILY WITH A MEAL, Disp: 60 tablet, Rfl: 5 .  cloNIDine (CATAPRES) 0.1 MG tablet, TAKE 1 TABLET(0.1 MG) BY MOUTH TWICE DAILY, Disp: 60 tablet, Rfl: 0 .  fluticasone (FLONASE) 50 MCG/ACT nasal spray, Place 1-2 sprays into both nostrils daily., Disp: 16 g, Rfl: 5 .  furosemide (LASIX) 20 MG tablet, furosemide 20 mg tablet  TK 1 T PO QD PRF SWELLING, Disp: , Rfl:  .  hydrALAZINE (APRESOLINE) 50 MG tablet, Take 0.5 tablets (25 mg total) by mouth 3 (three) times  daily. if your Systolic OZ>308 or Diastolic MV>784, Disp: 3 tablet, Rfl: 1 .  LORazepam (ATIVAN) 1 MG tablet, TAKE 1 TABLET BY MOUTH TWICE DAILY AS NEEDED, Disp: 180 tablet, Rfl: 1 .  pantoprazole (PROTONIX) 40 MG tablet, Take 1 tablet (40 mg total) by mouth daily., Disp: 30 tablet, Rfl: 5 .  quinapril (ACCUPRIL) 5 MG tablet, Take 5 mg by mouth 2 (two) times daily. , Disp: , Rfl:  .  ranitidine (ZANTAC) 150 MG tablet, Take 150 mg by mouth 2 (two) times daily., Disp: , Rfl:  .  valACYclovir (VALTREX) 1000 MG tablet, 2 tablets twice a day for 1 day as needed for cold sores,  Disp: 20 tablet, Rfl: 1 .  zolpidem (AMBIEN) 10 MG tablet, TAKE 1 TABLET BY MOUTH AT BEDTIME AS NEEDED, Disp: 30 tablet, Rfl: 0 .  amoxicillin (AMOXIL) 875 MG tablet, TAKE 1 TABLET(875 MG) BY MOUTH TWICE DAILY (Patient not taking: Reported on 12/21/2017), Disp: 20 tablet, Rfl: 0 .  amoxicillin-clavulanate (AUGMENTIN) 875-125 MG tablet, Take 1 tablet by mouth 2 (two) times daily. Take with amoxicillin 875 (Patient not taking: Reported on 12/21/2017), Disp: 20 tablet, Rfl: 0 .  doxazosin (CARDURA) 8 MG tablet, Take 1 tablet (8 mg total) by mouth daily. (Patient not taking: Reported on 12/21/2017), Disp: 30 tablet, Rfl: 1  Review of Systems  Genitourinary: Positive for frequency.  All other systems reviewed and are negative.   Social History   Tobacco Use  . Smoking status: Never Smoker  . Smokeless tobacco: Never Used  Substance Use Topics  . Alcohol use: Yes    Alcohol/week: 0.0 - 2.0 standard drinks   Objective:   BP (!) 172/76 (BP Location: Right Arm, Patient Position: Sitting, Cuff Size: Small)   Pulse 64   Temp 97.7 F (36.5 C) (Oral)   Resp 16   Ht 5\' 4"  (1.626 m)   Wt 108 lb (49 kg)   SpO2 99%   BMI 18.54 kg/m  Vitals:   12/21/17 1427  BP: (!) 172/76  Pulse: 64  Resp: 16  Temp: 97.7 F (36.5 C)  TempSrc: Oral  SpO2: 99%  Weight: 108 lb (49 kg)  Height: 5\' 4"  (1.626 m)     Physical Exam   General Appearance:    Alert, cooperative, no distress, appears stated age  Head:    Normocephalic, without obvious abnormality, atraumatic  Eyes:    PERRL, conjunctiva/corneas clear, EOM's intact, fundi    benign, both eyes  Ears:    Normal TM's and external ear canals, both ears  Nose:   Nares normal, septum midline, mucosa normal, no drainage    or sinus tenderness  Throat:   Lips, mucosa, and tongue normal; teeth and gums normal  Neck:   Supple, symmetrical, trachea midline, no adenopathy;    thyroid:  no enlargement/tenderness/nodules; no carotid   bruit or JVD  Back:      Symmetric, no curvature, ROM normal, no CVA tenderness  Lungs:     Clear to auscultation bilaterally, respirations unlabored  Chest Wall:    No tenderness or deformity   Heart:    Regular rate and rhythm, S1 and S2 normal, no murmur, rub   or gallop  Breast Exam:    normal appearance, no masses or tenderness  Abdomen:     Soft, non-tender, bowel sounds active all four quadrants,    no masses, no organomegaly  Pelvic:    deferred  Neurologic:   CNII-XII intact, normal strength, sensation  and reflexes    throughout       Assessment & Plan:     1. Frequency of urination No sign of u/a today.  - POCT urinalysis dipstick  2. Essential hypertension Tolerating current medications. But sbp remains uncontrolled. Currently off of alpha blocker. Will check labs and have her address with Dr. Candiss Norse at nephrology follow up tomorrow.  - amLODipine (NORVASC) 5 MG tablet; TAKE 1 TABLET(5 MG) BY MOUTH DAILY  Dispense: 90 tablet; Refill: 4 - EKG 12-Lead  3. Hyperparathyroidism, secondary renal (HCC)  - VITAMIN D 25 Hydroxy (Vit-D Deficiency, Fractures)  4. CKD (chronic kidney disease) stage 4, GFR 15-29 ml/min (HCC)  - Comprehensive metabolic panel - VITAMIN D 25 Hydroxy (Vit-D Deficiency, Fractures)  5. Anemia in stage 4 chronic kidney disease ([place]) Follow up hematology tomorrow as scheduled.  - CBC  6. Right thyroid nodule Due for follow up u/s  - US thyroid      Lelon Huh, MD  [facility]

## 2017-12-22 ENCOUNTER — Other Ambulatory Visit: Payer: Self-pay

## 2017-12-22 ENCOUNTER — Inpatient Hospital Stay (HOSPITAL_BASED_OUTPATIENT_CLINIC_OR_DEPARTMENT_OTHER): Payer: Medicare Other | Admitting: Oncology

## 2017-12-22 ENCOUNTER — Inpatient Hospital Stay: Payer: Medicare Other

## 2017-12-22 ENCOUNTER — Inpatient Hospital Stay: Payer: Medicare Other | Attending: Oncology

## 2017-12-22 VITALS — BP 137/74 | HR 74 | Temp 95.6°F | Resp 18 | Wt 109.1 lb

## 2017-12-22 DIAGNOSIS — F419 Anxiety disorder, unspecified: Secondary | ICD-10-CM | POA: Insufficient documentation

## 2017-12-22 DIAGNOSIS — D631 Anemia in chronic kidney disease: Secondary | ICD-10-CM

## 2017-12-22 DIAGNOSIS — Z79899 Other long term (current) drug therapy: Secondary | ICD-10-CM | POA: Diagnosis not present

## 2017-12-22 DIAGNOSIS — N184 Chronic kidney disease, stage 4 (severe): Secondary | ICD-10-CM | POA: Insufficient documentation

## 2017-12-22 DIAGNOSIS — I129 Hypertensive chronic kidney disease with stage 1 through stage 4 chronic kidney disease, or unspecified chronic kidney disease: Secondary | ICD-10-CM | POA: Insufficient documentation

## 2017-12-22 DIAGNOSIS — Z7982 Long term (current) use of aspirin: Secondary | ICD-10-CM | POA: Diagnosis not present

## 2017-12-22 DIAGNOSIS — R5383 Other fatigue: Secondary | ICD-10-CM

## 2017-12-22 DIAGNOSIS — M1712 Unilateral primary osteoarthritis, left knee: Secondary | ICD-10-CM | POA: Diagnosis not present

## 2017-12-22 DIAGNOSIS — I10 Essential (primary) hypertension: Secondary | ICD-10-CM

## 2017-12-22 DIAGNOSIS — D509 Iron deficiency anemia, unspecified: Secondary | ICD-10-CM

## 2017-12-22 LAB — CBC
HEMOGLOBIN: 7.6 g/dL — AB (ref 11.1–15.9)
Hematocrit: 22.4 % — ABNORMAL LOW (ref 34.0–46.6)
MCH: 31 pg (ref 26.6–33.0)
MCHC: 33.9 g/dL (ref 31.5–35.7)
MCV: 91 fL (ref 79–97)
Platelets: 232 10*3/uL (ref 150–450)
RBC: 2.45 x10E6/uL — AB (ref 3.77–5.28)
RDW: 15.8 % — ABNORMAL HIGH (ref 12.3–15.4)
WBC: 6.4 10*3/uL (ref 3.4–10.8)

## 2017-12-22 LAB — CBC WITH DIFFERENTIAL/PLATELET
BASOS ABS: 0.1 10*3/uL (ref 0–0.1)
BASOS PCT: 1 %
EOS PCT: 5 %
Eosinophils Absolute: 0.2 10*3/uL (ref 0–0.7)
HEMATOCRIT: 21.6 % — AB (ref 35.0–47.0)
Hemoglobin: 7.2 g/dL — ABNORMAL LOW (ref 12.0–16.0)
LYMPHS PCT: 16 %
Lymphs Abs: 0.8 10*3/uL — ABNORMAL LOW (ref 1.0–3.6)
MCH: 31.3 pg (ref 26.0–34.0)
MCHC: 33.3 g/dL (ref 32.0–36.0)
MCV: 94 fL (ref 80.0–100.0)
Monocytes Absolute: 0.3 10*3/uL (ref 0.2–0.9)
Monocytes Relative: 6 %
Neutro Abs: 3.5 10*3/uL (ref 1.4–6.5)
Neutrophils Relative %: 72 %
PLATELETS: 206 10*3/uL (ref 150–440)
RBC: 2.3 MIL/uL — ABNORMAL LOW (ref 3.80–5.20)
RDW: 15.8 % — ABNORMAL HIGH (ref 11.5–14.5)
WBC: 4.8 10*3/uL (ref 3.6–11.0)

## 2017-12-22 LAB — COMPREHENSIVE METABOLIC PANEL
A/G RATIO: 2.2 (ref 1.2–2.2)
ALK PHOS: 131 IU/L — AB (ref 39–117)
ALT: 12 IU/L (ref 0–32)
AST: 15 IU/L (ref 0–40)
Albumin: 3.8 g/dL (ref 3.6–4.8)
BUN/Creatinine Ratio: 15 (ref 12–28)
BUN: 44 mg/dL — ABNORMAL HIGH (ref 8–27)
CALCIUM: 9 mg/dL (ref 8.7–10.3)
CO2: 14 mmol/L — ABNORMAL LOW (ref 20–29)
Chloride: 106 mmol/L (ref 96–106)
Creatinine, Ser: 2.96 mg/dL — ABNORMAL HIGH (ref 0.57–1.00)
GFR calc Af Amer: 18 mL/min/{1.73_m2} — ABNORMAL LOW (ref >59)
GFR calc non Af Amer: 16 mL/min/{1.73_m2} — ABNORMAL LOW (ref >59)
Globulin, Total: 1.7 g/dL (ref 1.5–4.5)
Glucose: 88 mg/dL (ref 65–99)
POTASSIUM: 5.2 mmol/L (ref 3.5–5.2)
Sodium: 135 mmol/L (ref 134–144)
Total Protein: 5.5 g/dL — ABNORMAL LOW (ref 6.0–8.5)

## 2017-12-22 LAB — VITAMIN D 25 HYDROXY (VIT D DEFICIENCY, FRACTURES): VIT D 25 HYDROXY: 27.7 ng/mL — AB (ref 30.0–100.0)

## 2017-12-22 LAB — SAMPLE TO BLOOD BANK

## 2017-12-22 MED ORDER — EPOETIN ALFA 40000 UNIT/ML IJ SOLN
40000.0000 [IU] | Freq: Once | INTRAMUSCULAR | Status: AC
  Administered 2017-12-22: 40000 [IU] via SUBCUTANEOUS
  Filled 2017-12-22: qty 1

## 2017-12-22 NOTE — Progress Notes (Signed)
Per pt feeling " less energetic " R knee in brace- torn meniscus per pt.

## 2017-12-22 NOTE — Addendum Note (Signed)
Addended by: [name] on: 12/22/2017 10:12 AM   Modules accepted: Orders

## 2017-12-27 ENCOUNTER — Ambulatory Visit: Payer: Medicare Other

## 2017-12-27 ENCOUNTER — Other Ambulatory Visit: Payer: Self-pay | Admitting: Family Medicine

## 2017-12-27 DIAGNOSIS — J329 Chronic sinusitis, unspecified: Secondary | ICD-10-CM

## 2018-01-09 ENCOUNTER — Other Ambulatory Visit: Payer: Self-pay | Admitting: Family Medicine

## 2018-01-09 DIAGNOSIS — G4700 Insomnia, unspecified: Secondary | ICD-10-CM

## 2018-01-12 ENCOUNTER — Inpatient Hospital Stay: Payer: Medicare Other | Attending: Oncology

## 2018-01-12 ENCOUNTER — Inpatient Hospital Stay: Payer: Medicare Other

## 2018-01-19 DIAGNOSIS — S83207A Unspecified tear of unspecified meniscus, current injury, left knee, initial encounter: Secondary | ICD-10-CM | POA: Diagnosis not present

## 2018-01-20 ENCOUNTER — Other Ambulatory Visit: Payer: Self-pay | Admitting: Family Medicine

## 2018-01-20 DIAGNOSIS — J329 Chronic sinusitis, unspecified: Secondary | ICD-10-CM

## 2018-01-20 MED ORDER — AMOXICILLIN-POT CLAVULANATE 875-125 MG PO TABS: ORAL_TABLET | ORAL | 0 refills | Status: DC

## 2018-02-02 ENCOUNTER — Other Ambulatory Visit: Payer: Self-pay | Admitting: *Deleted

## 2018-02-02 ENCOUNTER — Inpatient Hospital Stay: Payer: Medicare Other

## 2018-02-02 ENCOUNTER — Inpatient Hospital Stay: Payer: Medicare Other | Attending: Oncology

## 2018-02-02 VITALS — BP 116/61 | HR 65

## 2018-02-02 DIAGNOSIS — D631 Anemia in chronic kidney disease: Secondary | ICD-10-CM | POA: Diagnosis not present

## 2018-02-02 DIAGNOSIS — N184 Chronic kidney disease, stage 4 (severe): Secondary | ICD-10-CM | POA: Diagnosis not present

## 2018-02-02 DIAGNOSIS — D509 Iron deficiency anemia, unspecified: Secondary | ICD-10-CM

## 2018-02-02 DIAGNOSIS — N189 Chronic kidney disease, unspecified: Secondary | ICD-10-CM

## 2018-02-02 LAB — CBC WITH DIFFERENTIAL/PLATELET
BASOS ABS: 0.1 10*3/uL (ref 0–0.1)
BASOS PCT: 1 %
EOS ABS: 0.4 10*3/uL (ref 0–0.7)
Eosinophils Relative: 8 %
HEMATOCRIT: 20.7 % — AB (ref 35.0–47.0)
HEMOGLOBIN: 7 g/dL — AB (ref 12.0–16.0)
Lymphocytes Relative: 17 %
Lymphs Abs: 0.9 10*3/uL — ABNORMAL LOW (ref 1.0–3.6)
MCH: 33.1 pg (ref 26.0–34.0)
MCHC: 33.8 g/dL (ref 32.0–36.0)
MCV: 98.1 fL (ref 80.0–100.0)
Monocytes Absolute: 0.3 10*3/uL (ref 0.2–0.9)
Monocytes Relative: 6 %
NEUTROS ABS: 3.5 10*3/uL (ref 1.4–6.5)
NEUTROS PCT: 68 %
Platelets: 193 10*3/uL (ref 150–440)
RBC: 2.11 MIL/uL — ABNORMAL LOW (ref 3.80–5.20)
RDW: 18.9 % — AB (ref 11.5–14.5)
WBC: 5.1 10*3/uL (ref 3.6–11.0)

## 2018-02-02 LAB — SAMPLE TO BLOOD BANK

## 2018-02-02 LAB — IRON AND TIBC
Iron: 104 ug/dL (ref 28–170)
SATURATION RATIOS: 46 % — AB (ref 10.4–31.8)
TIBC: 229 ug/dL — ABNORMAL LOW (ref 250–450)
UIBC: 125 ug/dL

## 2018-02-02 LAB — FERRITIN: Ferritin: 68 ng/mL (ref 11–307)

## 2018-02-02 MED ORDER — EPOETIN ALFA 40000 UNIT/ML IJ SOLN
40000.0000 [IU] | Freq: Once | INTRAMUSCULAR | Status: AC
  Administered 2018-02-02: 40000 [IU] via SUBCUTANEOUS

## 2018-02-03 LAB — VITAMIN B12: Vitamin B-12: 438 pg/mL (ref 180–914)

## 2018-02-07 ENCOUNTER — Other Ambulatory Visit: Payer: Self-pay | Admitting: Family Medicine

## 2018-02-07 DIAGNOSIS — J329 Chronic sinusitis, unspecified: Secondary | ICD-10-CM

## 2018-02-22 ENCOUNTER — Other Ambulatory Visit: Payer: Self-pay | Admitting: Family Medicine

## 2018-02-22 DIAGNOSIS — R51 Headache: Principal | ICD-10-CM

## 2018-02-22 DIAGNOSIS — G8929 Other chronic pain: Secondary | ICD-10-CM

## 2018-02-23 ENCOUNTER — Inpatient Hospital Stay: Payer: Medicare Other

## 2018-02-23 ENCOUNTER — Other Ambulatory Visit: Payer: Self-pay | Admitting: Cardiovascular Disease

## 2018-02-23 ENCOUNTER — Other Ambulatory Visit: Payer: Self-pay

## 2018-02-23 VITALS — BP 178/78

## 2018-02-23 DIAGNOSIS — D631 Anemia in chronic kidney disease: Secondary | ICD-10-CM

## 2018-02-23 DIAGNOSIS — N184 Chronic kidney disease, stage 4 (severe): Secondary | ICD-10-CM | POA: Diagnosis not present

## 2018-02-23 DIAGNOSIS — D509 Iron deficiency anemia, unspecified: Secondary | ICD-10-CM

## 2018-02-23 LAB — CBC WITH DIFFERENTIAL/PLATELET
Abs Immature Granulocytes: 0.01 10*3/uL (ref 0.00–0.07)
BASOS ABS: 0 10*3/uL (ref 0.0–0.1)
BASOS PCT: 1 %
EOS ABS: 0.3 10*3/uL (ref 0.0–0.5)
Eosinophils Relative: 7 %
HEMATOCRIT: 24 % — AB (ref 36.0–46.0)
Hemoglobin: 7.5 g/dL — ABNORMAL LOW (ref 12.0–15.0)
IMMATURE GRANULOCYTES: 0 %
Lymphocytes Relative: 16 %
Lymphs Abs: 0.6 10*3/uL — ABNORMAL LOW (ref 0.7–4.0)
MCH: 31.8 pg (ref 26.0–34.0)
MCHC: 31.3 g/dL (ref 30.0–36.0)
MCV: 101.7 fL — AB (ref 80.0–100.0)
Monocytes Absolute: 0.3 10*3/uL (ref 0.1–1.0)
Monocytes Relative: 8 %
NEUTROS PCT: 68 %
NRBC: 0 % (ref 0.0–0.2)
Neutro Abs: 2.5 10*3/uL (ref 1.7–7.7)
Platelets: 199 10*3/uL (ref 150–400)
RBC: 2.36 MIL/uL — ABNORMAL LOW (ref 3.87–5.11)
RDW: 15.3 % (ref 11.5–15.5)
WBC: 3.7 10*3/uL — AB (ref 4.0–10.5)

## 2018-02-23 LAB — SAMPLE TO BLOOD BANK

## 2018-02-23 MED ORDER — EPOETIN ALFA 40000 UNIT/ML IJ SOLN
40000.0000 [IU] | Freq: Once | INTRAMUSCULAR | Status: AC
  Administered 2018-02-23: 40000 [IU] via SUBCUTANEOUS

## 2018-02-23 NOTE — Progress Notes (Signed)
Patient here today for procrit bp is 178/78 per Grayland Ormond ok for patient to get injection

## 2018-03-01 ENCOUNTER — Other Ambulatory Visit: Payer: Self-pay | Admitting: Family Medicine

## 2018-03-01 DIAGNOSIS — J329 Chronic sinusitis, unspecified: Secondary | ICD-10-CM

## 2018-03-10 ENCOUNTER — Other Ambulatory Visit: Payer: Self-pay | Admitting: Family Medicine

## 2018-03-10 DIAGNOSIS — R51 Headache: Principal | ICD-10-CM

## 2018-03-10 DIAGNOSIS — G8929 Other chronic pain: Secondary | ICD-10-CM

## 2018-03-15 ENCOUNTER — Ambulatory Visit (INDEPENDENT_AMBULATORY_CARE_PROVIDER_SITE_OTHER): Payer: Medicare Other

## 2018-03-15 DIAGNOSIS — Z23 Encounter for immunization: Secondary | ICD-10-CM

## 2018-03-16 ENCOUNTER — Inpatient Hospital Stay: Payer: Medicare Other | Attending: Oncology

## 2018-03-16 ENCOUNTER — Inpatient Hospital Stay: Payer: Medicare Other

## 2018-03-16 VITALS — BP 174/72

## 2018-03-16 DIAGNOSIS — D631 Anemia in chronic kidney disease: Secondary | ICD-10-CM | POA: Insufficient documentation

## 2018-03-16 DIAGNOSIS — D509 Iron deficiency anemia, unspecified: Secondary | ICD-10-CM

## 2018-03-16 DIAGNOSIS — N184 Chronic kidney disease, stage 4 (severe): Secondary | ICD-10-CM | POA: Insufficient documentation

## 2018-03-16 LAB — CBC WITH DIFFERENTIAL/PLATELET
ABS IMMATURE GRANULOCYTES: 0.02 10*3/uL (ref 0.00–0.07)
BASOS PCT: 1 %
Basophils Absolute: 0.1 10*3/uL (ref 0.0–0.1)
Eosinophils Absolute: 0.3 10*3/uL (ref 0.0–0.5)
Eosinophils Relative: 5 %
HCT: 27.4 % — ABNORMAL LOW (ref 36.0–46.0)
Hemoglobin: 8.7 g/dL — ABNORMAL LOW (ref 12.0–15.0)
IMMATURE GRANULOCYTES: 0 %
Lymphocytes Relative: 12 %
Lymphs Abs: 0.7 10*3/uL (ref 0.7–4.0)
MCH: 31 pg (ref 26.0–34.0)
MCHC: 31.8 g/dL (ref 30.0–36.0)
MCV: 97.5 fL (ref 80.0–100.0)
MONO ABS: 0.4 10*3/uL (ref 0.1–1.0)
Monocytes Relative: 6 %
NEUTROS ABS: 4.4 10*3/uL (ref 1.7–7.7)
NEUTROS PCT: 76 %
NRBC: 0 % (ref 0.0–0.2)
PLATELETS: 246 10*3/uL (ref 150–400)
RBC: 2.81 MIL/uL — AB (ref 3.87–5.11)
RDW: 14.5 % (ref 11.5–15.5)
WBC: 5.8 10*3/uL (ref 4.0–10.5)

## 2018-03-16 LAB — SAMPLE TO BLOOD BANK

## 2018-03-16 MED ORDER — EPOETIN ALFA 40000 UNIT/ML IJ SOLN
40000.0000 [IU] | Freq: Once | INTRAMUSCULAR | Status: AC
  Administered 2018-03-16: 40000 [IU] via SUBCUTANEOUS

## 2018-03-16 NOTE — Progress Notes (Signed)
OK per dr Grayland Ormond to give procrit with bp of 174/72

## 2018-03-21 ENCOUNTER — Other Ambulatory Visit: Payer: Self-pay | Admitting: Family Medicine

## 2018-03-21 DIAGNOSIS — J329 Chronic sinusitis, unspecified: Secondary | ICD-10-CM

## 2018-04-03 ENCOUNTER — Other Ambulatory Visit: Payer: Self-pay | Admitting: Family Medicine

## 2018-04-03 DIAGNOSIS — G4700 Insomnia, unspecified: Secondary | ICD-10-CM

## 2018-04-03 NOTE — Telephone Encounter (Signed)
[place] faxed refill request for the following medications:  LORazepam (ATIVAN) 1 MG tablet   Qty: 180  Last refill: 06/23/2017  Please advise.

## 2018-04-04 MED ORDER — LORAZEPAM 1 MG PO TABS: 1.0000 mg | ORAL_TABLET | Freq: Two times a day (BID) | ORAL | 1 refills | Status: DC | PRN

## 2018-04-06 ENCOUNTER — Inpatient Hospital Stay: Payer: Medicare Other

## 2018-04-06 ENCOUNTER — Inpatient Hospital Stay: Payer: Medicare Other | Attending: Oncology

## 2018-04-06 VITALS — BP 184/72

## 2018-04-06 DIAGNOSIS — D509 Iron deficiency anemia, unspecified: Secondary | ICD-10-CM

## 2018-04-06 DIAGNOSIS — N184 Chronic kidney disease, stage 4 (severe): Secondary | ICD-10-CM | POA: Diagnosis not present

## 2018-04-06 DIAGNOSIS — D631 Anemia in chronic kidney disease: Secondary | ICD-10-CM | POA: Diagnosis not present

## 2018-04-06 LAB — CBC WITH DIFFERENTIAL/PLATELET
Abs Immature Granulocytes: 0.01 10*3/uL (ref 0.00–0.07)
Basophils Absolute: 0.1 10*3/uL (ref 0.0–0.1)
Basophils Relative: 1 %
Eosinophils Absolute: 0.3 10*3/uL (ref 0.0–0.5)
Eosinophils Relative: 5 %
HEMATOCRIT: 26.8 % — AB (ref 36.0–46.0)
Hemoglobin: 8.3 g/dL — ABNORMAL LOW (ref 12.0–15.0)
Immature Granulocytes: 0 %
Lymphocytes Relative: 15 %
Lymphs Abs: 0.8 10*3/uL (ref 0.7–4.0)
MCH: 29.3 pg (ref 26.0–34.0)
MCHC: 31 g/dL (ref 30.0–36.0)
MCV: 94.7 fL (ref 80.0–100.0)
Monocytes Absolute: 0.4 10*3/uL (ref 0.1–1.0)
Monocytes Relative: 7 %
NEUTROS ABS: 3.9 10*3/uL (ref 1.7–7.7)
Neutrophils Relative %: 72 %
Platelets: 208 10*3/uL (ref 150–400)
RBC: 2.83 MIL/uL — ABNORMAL LOW (ref 3.87–5.11)
RDW: 13.8 % (ref 11.5–15.5)
WBC: 5.5 10*3/uL (ref 4.0–10.5)
nRBC: 0 % (ref 0.0–0.2)

## 2018-04-06 LAB — SAMPLE TO BLOOD BANK

## 2018-04-06 MED ORDER — EPOETIN ALFA 40000 UNIT/ML IJ SOLN
40000.0000 [IU] | Freq: Once | INTRAMUSCULAR | Status: AC
  Administered 2018-04-06: 40000 [IU] via SUBCUTANEOUS

## 2018-04-06 NOTE — Progress Notes (Signed)
Per Grayland Ormond ok to give procrit injection today

## 2018-04-10 ENCOUNTER — Other Ambulatory Visit: Payer: Self-pay | Admitting: Family Medicine

## 2018-04-10 DIAGNOSIS — J329 Chronic sinusitis, unspecified: Secondary | ICD-10-CM

## 2018-04-11 NOTE — Telephone Encounter (Signed)
Please advise patient refill was sent for Augmentin, but she can only take one tablet twice a day, and not the additional amoxicillin due to her kidney functions.  Also we haven't gotten any notes for her kidney doctor in several months. Is she still going. If not she needs to schedule follow up with him.

## 2018-04-11 NOTE — Telephone Encounter (Signed)
Pt advised.  She has an appointment with the kidney doctor in January.   Thanks,   -Mickel Baas

## 2018-04-13 NOTE — Progress Notes (Signed)
Cardiology Office Note  Date:  04/14/2018   ID:  Amy [name], DOB 03-Mar-1951, MRN [id number]  PCP:  [name], MD   Chief Complaint  Patient presents with  . other    6 mo  follow up. Medications reviewed verbally.     HPI:  Amy [name] is a 67 year old woman with history of  Hypertension chronic renal insufficiency,  anemia chronic headaches managed with pain medication,  EGD confirming gastritis,  hospital admission for hyponatremia for which she received IV fluids and diuretic was held,   hospital admission for chest pain Felt secondary to gastritis Who presents for follow-up of her  essential hypertension, new leg swelling  Reports having worsening lower extremity edema that is pitting She has increased her Lasix now up to 3 times a day Swelling worse in the past 2 to 3 weeks Denies excessive fluid intake  Difficulty with chronic anemia felt secondary to underlying renal dysfunction Reports having EGD colonoscopy which were unrevealing Receives Procrit every 3 weeks Recent hemoglobin 7 now up to 8 She does not like to wear compression hose  Iron levels ok, B12 within normal range CR 1.6, BUN 26, Dr. Candiss Norse (10/2016)  Now creatinine up to 3, last lab in our system August 2019  She reports blood pressures consistently elevated but does not check it at home Blood pressure elevated on today's visit No chest pain Chronic fatigue  EKG personally reviewed by myself on todays visit shows normal sinus rhythm  with rate 66 bpm with no significant ST or T-wave changes  Other past medical history reviewed Previous chest pain with atypical in nature, possible anxiety    PMH:   has a past medical history of Anemia, Anxiety, CKD (chronic kidney disease), stage III ([place]), Colon polyp, Gastritis, GERD (gastroesophageal reflux disease), History of chicken pox, History of measles as a child, Hypertension, Hyponatremia, and Multiple thyroid nodules.  PSH:    Past Surgical  History:  Procedure Laterality Date  . ABDOMINAL HYSTERECTOMY  2003   due to heavy periods and clotting during menses  . BREAST LUMPECTOMY Left 1990  . CHOLECYSTECTOMY  2010  . COLONOSCOPY WITH PROPOFOL N/A 09/07/2016   Procedure: COLONOSCOPY WITH PROPOFOL;  Surgeon: Lucilla Lame, MD;  Location: [facility] ENDOSCOPY;  Service: Endoscopy;  Laterality: N/A;  . DILATION AND CURETTAGE OF UTERUS  1985  . ESOPHAGOGASTRODUODENOSCOPY (EGD) WITH PROPOFOL N/A 09/07/2016   Procedure: ESOPHAGOGASTRODUODENOSCOPY (EGD) WITH PROPOFOL;  Surgeon: Lucilla Lame, MD;  Location: [facility] ENDOSCOPY;  Service: Endoscopy;  Laterality: N/A;  . GALLBLADDER SURGERY  2012  . KNEE SURGERY     Dr. Tamala Julian  . MRI, Lumbar spine  07/23/2009   Multilevel annular bulge and disc protrusion at L2-L3, L3-L4, L4-L5, and L5-S1  . TONSILLECTOMY    . TONSILLECTOMY AND ADENOIDECTOMY  1970  . UPPER GI ENDOSCOPY  03/02/2006   H. Pylori negative; Dr. Allen Norris    Current Outpatient Medications  Medication Sig Dispense Refill  . amLODipine (NORVASC) 5 MG tablet TAKE 1 TABLET(5 MG) BY MOUTH DAILY 90 tablet 4  . aspirin EC 81 MG EC tablet Take 1 tablet (81 mg total) by mouth daily.    [name] buPROPion (WELLBUTRIN) 75 MG tablet Take 1 tablet (75 mg total) by mouth 2 (two) times daily. (Patient taking differently: Take 150 mg by mouth daily. ) 180 tablet 4  . butalbital-acetaminophen-caffeine (FIORICET, ESGIC) 50-325-40 MG tablet TAKE 1 TO 2 TABLETS BY MOUTH EVERY 4 TO 6 HOURS AS NEEDED FOR PAIN.  MAX OF 8 PER DAY 150 tablet 3  . carvedilol (COREG) 25 MG tablet TAKE 1 TABLET(25 MG) BY MOUTH TWICE DAILY WITH A MEAL 60 tablet 5  . cloNIDine (CATAPRES) 0.1 MG tablet TAKE 1 TABLET(0.1 MG) BY MOUTH TWICE DAILY 60 tablet 0  . furosemide (LASIX) 20 MG tablet furosemide 20 mg tablet  TK 1 T PO QD PRF SWELLING    . hydrALAZINE (APRESOLINE) 50 MG tablet Take 0.5 tablets (25 mg total) by mouth 3 (three) times daily. if your Systolic QJ>194 or Diastolic RD>408 3 tablet 1   . LORazepam (ATIVAN) 1 MG tablet Take 1 tablet (1 mg total) by mouth 2 (two) times daily as needed. 180 tablet 1  . quinapril (ACCUPRIL) 5 MG tablet Take 5 mg by mouth 2 (two) times daily.     . ranitidine (ZANTAC) 150 MG tablet Take 150 mg by mouth 2 (two) times daily.    . valACYclovir (VALTREX) 1000 MG tablet 2 tablets twice a day for 1 day as needed for cold sores 20 tablet 1  . zolpidem (AMBIEN) 10 MG tablet TAKE 1 TABLET BY MOUTH AT BEDTIME AS NEEDED 30 tablet 0   No current facility-administered medications for this visit.      Allergies:   Cephalosporins; Clarithromycin; Lunesta  [eszopiclone]; Penicillin v; and Sulfa antibiotics   Social History:  The patient  reports that she has never smoked. She has never used smokeless tobacco. She reports current alcohol use. She reports that she does not use drugs.   Family History:   family history includes Cancer in her paternal grandfather; Congestive Heart Failure in her maternal grandmother; Heart disease in her maternal uncle; Pancreatic cancer in her father.    Review of Systems: Review of Systems  Constitutional: Positive for malaise/fatigue.  Respiratory: Negative.   Cardiovascular: Positive for leg swelling.  Gastrointestinal: Negative.   Musculoskeletal: Negative.   Neurological: Negative.   Psychiatric/Behavioral: Negative.   All other systems reviewed and are negative.    PHYSICAL EXAM: VS:  BP (!) 164/68 (BP Location: Left Arm, Patient Position: Sitting, Cuff Size: Normal)   Pulse 66   Ht 5\' 1"  (1.549 m)   Wt 118 lb (53.5 kg)   BMI 22.30 kg/m  , BMI Body mass index is 22.3 kg/m. GEN: Thin, in no acute distress  HEENT: normal  Neck: no JVD, carotid bruits, or masses Cardiac: RRR; no murmurs, rubs, or gallops, 1-2+ pitting  edema  to the mid shins Respiratory:  clear to auscultation bilaterally, normal work of breathing GI: soft, nontender, nondistended, + BS MS: no deformity or atrophy  Skin: warm and dry, no  rash Neuro:  Strength and sensation are intact Psych: euthymic mood, full affect   Recent Labs: 12/21/2017: ALT 12; BUN 44; Creatinine, Ser 2.96; Potassium 5.2; Sodium 135 04/06/2018: Hemoglobin 8.3; Platelets 208    Lipid Panel Lab Results  Component Value Date   CHOL 165 09/24/2016   HDL 62 09/24/2016   LDLCALC 78 09/24/2016   TRIG 126 09/24/2016      Wt Readings from Last 3 Encounters:  04/14/18 118 lb (53.5 kg)  02/02/18 109 lb (49.4 kg)  12/22/17 109 lb 1.6 oz (49.5 kg)      ASSESSMENT AND PLAN:   Chronic kidney disease (CKD), active medical management without dialysis, stage 3 (moderate) - Followed by  Dr. Fredna Dow history of poorly controlled hypertension Recent overuse of Lasix, climbing creatinine from 2 up to 3 Lower extremity edema likely  secondary to underlying anemia  Lower extremity edema Recommend she hold amlodipine Compression hose  Essential hypertension - Plan: EKG 12-Lead We will hold amlodipine as above, increase clonidine up to 0.2 mg twice daily Recommend she monitor blood pressure closely at home Elevated on today's visit and on other office visits per the patient If blood pressure continues to run high could increase hydralazine up to 75 mg 3 times daily She currently takes 50 mg 3 times daily  Chronic gastritis without bleeding, unspecified gastritis type - Plan: EKG 12-Lead Currently on proton pump inhibitor and H2 blocker  Anemia in stage 3 chronic kidney disease She received Epogen Hemoglobin 7-8 likely contributing to lower extremity edema  Long discussion with her concerning each of her medical problems Discussed anemia, relationship with leg swelling Discussed renal failure, importance of blood pressure control Discussed various treatment options for her blood pressure,  Side effects from amlodipine  Total encounter time more than 45 minutes  Greater than 50% was spent in counseling and coordination of care with the  patient   Disposition:   F/U 3 months   Orders Placed This Encounter  Procedures  . EKG 12-Lead     Signed, Esmond Plants, M.D., Ph.D. 04/14/2018  [place], [place]

## 2018-04-14 ENCOUNTER — Encounter: Payer: Self-pay | Admitting: Cardiovascular Disease

## 2018-04-14 ENCOUNTER — Ambulatory Visit (INDEPENDENT_AMBULATORY_CARE_PROVIDER_SITE_OTHER): Payer: Medicare Other | Admitting: Cardiovascular Disease

## 2018-04-14 VITALS — BP 164/68 | HR 66 | Ht 61.0 in | Wt 118.0 lb

## 2018-04-14 DIAGNOSIS — I10 Essential (primary) hypertension: Secondary | ICD-10-CM | POA: Diagnosis not present

## 2018-04-14 DIAGNOSIS — N183 Chronic kidney disease, stage 3 unspecified: Secondary | ICD-10-CM

## 2018-04-14 DIAGNOSIS — D631 Anemia in chronic kidney disease: Secondary | ICD-10-CM

## 2018-04-14 DIAGNOSIS — R0602 Shortness of breath: Secondary | ICD-10-CM | POA: Diagnosis not present

## 2018-04-14 DIAGNOSIS — M7989 Other specified soft tissue disorders: Secondary | ICD-10-CM

## 2018-04-14 DIAGNOSIS — K2950 Unspecified chronic gastritis without bleeding: Secondary | ICD-10-CM | POA: Diagnosis not present

## 2018-04-14 MED ORDER — CLONIDINE HCL 0.2 MG PO TABS: 0.2000 mg | ORAL_TABLET | Freq: Two times a day (BID) | ORAL | 3 refills | Status: DC

## 2018-04-14 NOTE — Patient Instructions (Addendum)
Compressions!   Medication Instructions:   Stop the amlodipine  Increase the clonidine up to 0.2 mg twice a day  Monitor blood pressure Goal is 140 on the top  If still running high, >140 Increase the hydralazine up to 75 mg three times a day  (1 1/2 pills three times a day)  If pressure still high, Call the office  If you need a refill on your cardiac medications before your next appointment, please call your pharmacy.    Lab work: No new labs needed  If you have labs (blood work) drawn today and your tests are completely normal, you will receive your results only by: [name] MyChart Message (if you have MyChart) OR . A paper copy in the mail If you have any lab test that is abnormal or we need to change your treatment, we will call you to review the results.   Testing/Procedures: No new testing needed   Follow-Up: At [facility], you and your health needs are our priority.  As part of our continuing mission to provide you with exceptional heart care, we have created designated Provider Care Teams.  These Care Teams include your primary Cardiologist (physician) and Advanced Practice Providers (APPs -  Physician Assistants and Nurse Practitioners) who all work together to provide you with the care you need, when you need it.  . You will need a follow up appointment in 3 months .   Please call our office 2 months in advance to schedule this appointment.    . Providers on your designated Care Team:   . Murray Hodgkins, NP . Christell Faith, PA-C . Marrianne Mood, PA-C  Any Other Special Instructions Will Be Listed Below (If Applicable).  For educational health videos Log in to : [email or web address] Or : [email or web address], password : [email or web address]

## 2018-04-21 ENCOUNTER — Other Ambulatory Visit: Payer: Self-pay | Admitting: Family Medicine

## 2018-04-21 DIAGNOSIS — J329 Chronic sinusitis, unspecified: Secondary | ICD-10-CM

## 2018-04-23 NOTE — Progress Notes (Deleted)
[place]  [phone]) [phone] Fax:(336) [phone]  ID: Amy [name] OB: 1951/03/20  MR#: [id number]  BHA#:[id number]  Patient Care Team: [name], MD as PCP - General (Family Medicine) Thornton Park, MD as Referring Physician (Orthopedic Surgery) Lloyd Huger, MD as Consulting Physician (Oncology) Murlean Iba, MD as Referring Physician (Nephrology)  CHIEF COMPLAINT:  Anemia in stage IV chronic kidney disease.  INTERVAL HISTORY: Patient returns to clinic today for repeat laboratory work, further evaluation, and continuation of Procrit.  She has noted increasing weakness and fatigue over the past several weeks.  She otherwise feels well.  She has no neurologic complaints.  She denies any recent fevers or illnesses.  She has a good appetite and her weight has remained stable.  She has no chest pain or shortness of breath. She denies any nausea, vomiting, constipation, or diarrhea.  She denies any melena or hematochezia.  She has no urinary complaints.  Patient offers no further specific complaints today.  REVIEW OF SYSTEMS:   Review of Systems  Constitutional: Positive for malaise/fatigue. Negative for fever and weight loss.  Respiratory: Negative.  Negative for cough and shortness of breath.   Cardiovascular: Negative.  Negative for chest pain and leg swelling.  Gastrointestinal: Negative.  Negative for abdominal pain, blood in stool and melena.  Genitourinary: Negative.  Negative for dysuria and hematuria.  Musculoskeletal: Negative.  Negative for back pain.  Skin: Negative.  Negative for rash.  Neurological: Positive for weakness. Negative for sensory change and focal weakness.  Psychiatric/Behavioral: Negative.  The patient is not nervous/anxious.     As per HPI. Otherwise, a complete review of systems is negative.  PAST MEDICAL HISTORY: Past Medical History:  Diagnosis Date  . Anemia    a. 08/2016 Guaiac + stool. EGD w/ gastritis.  .  Anxiety   . CKD (chronic kidney disease), stage III ([place])   . Colon polyp    a. 08/2016 Colonoscopy.  . Gastritis    a. 08/2016 EGD: Gastritis-->PPI.  [name] GERD (gastroesophageal reflux disease)   . History of chicken pox   . History of measles as a child   . Hypertension   . Hyponatremia    a. 08/2016 in setting of HCTZ Rx.  . Multiple thyroid nodules     PAST SURGICAL HISTORY: Past Surgical History:  Procedure Laterality Date  . ABDOMINAL HYSTERECTOMY  2003   due to heavy periods and clotting during menses  . BREAST LUMPECTOMY Left 1990  . CHOLECYSTECTOMY  2010  . COLONOSCOPY WITH PROPOFOL N/A 09/07/2016   Procedure: COLONOSCOPY WITH PROPOFOL;  Surgeon: Lucilla Lame, MD;  Location: [facility] ENDOSCOPY;  Service: Endoscopy;  Laterality: N/A;  . DILATION AND CURETTAGE OF UTERUS  1985  . ESOPHAGOGASTRODUODENOSCOPY (EGD) WITH PROPOFOL N/A 09/07/2016   Procedure: ESOPHAGOGASTRODUODENOSCOPY (EGD) WITH PROPOFOL;  Surgeon: Lucilla Lame, MD;  Location: [facility] ENDOSCOPY;  Service: Endoscopy;  Laterality: N/A;  . GALLBLADDER SURGERY  2012  . KNEE SURGERY     Dr. Tamala Julian  . MRI, Lumbar spine  07/23/2009   Multilevel annular bulge and disc protrusion at L2-L3, L3-L4, L4-L5, and L5-S1  . TONSILLECTOMY    . TONSILLECTOMY AND ADENOIDECTOMY  1970  . UPPER GI ENDOSCOPY  03/02/2006   H. Pylori negative; Dr. Allen Norris    FAMILY HISTORY: Family History  Problem Relation Age of Onset  . Pancreatic cancer Father   . Congestive Heart Failure Maternal Grandmother   . Cancer Paternal Grandfather   . Heart disease Maternal Uncle  ADVANCED DIRECTIVES (Y/N):  N  HEALTH MAINTENANCE: Social History   Tobacco Use  . Smoking status: Never Smoker  . Smokeless tobacco: Never Used  Substance Use Topics  . Alcohol use: Yes    Alcohol/week: 0.0 - 2.0 standard drinks  . Drug use: No     Colonoscopy:  PAP:  Bone density:  Lipid panel:  Allergies  Allergen Reactions  . Cephalosporins   . Clarithromycin   .  Lunesta  [Eszopiclone]     Heart racing  . Penicillin V     Has patient had a PCN reaction causing immediate rash, facial/tongue/throat swelling, SOB or lightheadedness with hypotension: Yes Has patient had a PCN reaction causing severe rash involving mucus membranes or skin necrosis: No Has patient had a PCN reaction that required hospitalization No Has patient had a PCN reaction occurring within the last 10 years: No If all of the above answers are "NO", then may proceed with Cephalosporin use.  . Sulfa Antibiotics     Current Outpatient Medications  Medication Sig Dispense Refill  . amoxicillin-clavulanate (AUGMENTIN) 875-125 MG tablet TAKE 1 TABLET BY MOUTH TWICE DAILY. TAKE WITH AMOXICILLIN 875 20 tablet 0  . aspirin EC 81 MG EC tablet Take 1 tablet (81 mg total) by mouth daily.    [name] buPROPion (WELLBUTRIN) 75 MG tablet Take 1 tablet (75 mg total) by mouth 2 (two) times daily. (Patient taking differently: Take 150 mg by mouth daily. ) 180 tablet 4  . butalbital-acetaminophen-caffeine (FIORICET, ESGIC) 50-325-40 MG tablet TAKE 1 TO 2 TABLETS BY MOUTH EVERY 4 TO 6 HOURS AS NEEDED FOR PAIN. MAX OF 8 PER DAY 150 tablet 3  . carvedilol (COREG) 25 MG tablet TAKE 1 TABLET(25 MG) BY MOUTH TWICE DAILY WITH A MEAL 60 tablet 5  . cloNIDine (CATAPRES) 0.2 MG tablet Take 1 tablet (0.2 mg total) by mouth 2 (two) times daily. 180 tablet 3  . furosemide (LASIX) 20 MG tablet furosemide 20 mg tablet  TK 1 T PO QD PRF SWELLING    . hydrALAZINE (APRESOLINE) 50 MG tablet Take 1 tablet (50 mg total) by mouth 3 (three) times daily. if your Systolic LO>756 or Diastolic EP>329 3 tablet 1  . LORazepam (ATIVAN) 1 MG tablet Take 1 tablet (1 mg total) by mouth 2 (two) times daily as needed. 180 tablet 1  . quinapril (ACCUPRIL) 5 MG tablet Take 5 mg by mouth 2 (two) times daily.     . ranitidine (ZANTAC) 150 MG tablet Take 150 mg by mouth 2 (two) times daily.    . valACYclovir (VALTREX) 1000 MG tablet 2 tablets twice  a day for 1 day as needed for cold sores 20 tablet 1  . zolpidem (AMBIEN) 10 MG tablet TAKE 1 TABLET BY MOUTH AT BEDTIME AS NEEDED 30 tablet 0   No current facility-administered medications for this visit.     OBJECTIVE: There were no vitals filed for this visit.   There is no height or weight on file to calculate BMI.    ECOG FS:0 - Asymptomatic  General: Well-developed, well-nourished, no acute distress. Eyes: Pink conjunctiva, anicteric sclera. HEENT: Normocephalic, moist mucous membranes. Lungs: Clear to auscultation bilaterally. Heart: Regular rate and rhythm. No rubs, murmurs, or gallops. Abdomen: Soft, nontender, nondistended. No organomegaly noted, normoactive bowel sounds. Musculoskeletal: No edema, cyanosis, or clubbing. Neuro: Alert, answering all questions appropriately. Cranial nerves grossly intact. Skin: No rashes or petechiae noted. Psych: Normal affect.   LAB RESULTS:  Lab Results  Component  Value Date   NA 135 12/21/2017   K 5.2 12/21/2017   CL 106 12/21/2017   CO2 14 (L) 12/21/2017   GLUCOSE 88 12/21/2017   BUN 44 (H) 12/21/2017   CREATININE 2.96 (H) 12/21/2017   CALCIUM 9.0 12/21/2017   PROT 5.5 (L) 12/21/2017   ALBUMIN 3.8 12/21/2017   AST 15 12/21/2017   ALT 12 12/21/2017   ALKPHOS 131 (H) 12/21/2017   BILITOT <0.2 12/21/2017   GFRNONAA 16 (L) 12/21/2017   GFRAA 18 (L) 12/21/2017    Lab Results  Component Value Date   WBC 5.5 04/06/2018   NEUTROABS 3.9 04/06/2018   HGB 8.3 (L) 04/06/2018   HCT 26.8 (L) 04/06/2018   MCV 94.7 04/06/2018   PLT 208 04/06/2018   Lab Results  Component Value Date   IRON 104 02/02/2018   TIBC 229 (L) 02/02/2018   IRONPCTSAT 46 (H) 02/02/2018   Lab Results  Component Value Date   FERRITIN 68 02/02/2018     STUDIES: No results found.  ASSESSMENT: Anemia in stage IV chronic kidney disease.  PLAN:    1. Anemia in stage IV chronic kidney disease: Patient's most recent iron stores from May 09, 2017  are within normal limits. Repeat in the next several months.  She had a normal colonoscopy and EGD on Sep 07, 2016. CT of the abdomen and pelvis was essentially within normal limits.  Patient has chronic renal insufficiency which is likely contributing to her anemia.  Previously, her erythropoietin level was inappropriately low.  Her reticulocyte count was also low, possibly indicating an underlying bone marrow disorder.  After discussion with the patient, will hold on doing a bone marrow biopsy at this time the patient expressed understanding that it might be necessary in the future.  Patient's hemoglobin has declined, but she has only received 1 injection of Procrit since May 2019.  Proceed with 40,000 units of Procrit today.  Return to clinic every 3 weeks for laboratory work and Procrit if her hemoglobin remains below 10.0 and her blood pressure remains under control.  Return to clinic in 4 months with repeat laboratory work and further evaluation. 2. Chronic renal insufficiency: Continue treatment and evaluation per nephrology. 3.  Hypertension: Patient's blood pressure is within normal limits today.  Proceed with Procrit as above.  Patient expressed understanding and was in agreement with this plan. She also understands that She can call clinic at any time with any questions, concerns, or complaints.   Lloyd Huger, MD   04/23/2018 9:24 AM

## 2018-04-24 ENCOUNTER — Inpatient Hospital Stay: Payer: Medicare Other

## 2018-04-24 ENCOUNTER — Inpatient Hospital Stay: Payer: Medicare Other | Admitting: Oncology

## 2018-04-24 DIAGNOSIS — D631 Anemia in chronic kidney disease: Secondary | ICD-10-CM | POA: Diagnosis not present

## 2018-04-24 DIAGNOSIS — N184 Chronic kidney disease, stage 4 (severe): Principal | ICD-10-CM

## 2018-04-24 LAB — CBC WITH DIFFERENTIAL/PLATELET
Abs Immature Granulocytes: 0.02 10*3/uL (ref 0.00–0.07)
BASOS ABS: 0.1 10*3/uL (ref 0.0–0.1)
Basophils Relative: 1 %
Eosinophils Absolute: 0.2 10*3/uL (ref 0.0–0.5)
Eosinophils Relative: 4 %
HCT: 29.1 % — ABNORMAL LOW (ref 36.0–46.0)
Hemoglobin: 9.1 g/dL — ABNORMAL LOW (ref 12.0–15.0)
Immature Granulocytes: 0 %
LYMPHS PCT: 18 %
Lymphs Abs: 1 10*3/uL (ref 0.7–4.0)
MCH: 28.8 pg (ref 26.0–34.0)
MCHC: 31.3 g/dL (ref 30.0–36.0)
MCV: 92.1 fL (ref 80.0–100.0)
Monocytes Absolute: 0.3 10*3/uL (ref 0.1–1.0)
Monocytes Relative: 5 %
NRBC: 0 % (ref 0.0–0.2)
Neutro Abs: 3.8 10*3/uL (ref 1.7–7.7)
Neutrophils Relative %: 72 %
Platelets: 257 10*3/uL (ref 150–400)
RBC: 3.16 MIL/uL — ABNORMAL LOW (ref 3.87–5.11)
RDW: 14 % (ref 11.5–15.5)
WBC: 5.3 10*3/uL (ref 4.0–10.5)

## 2018-04-24 LAB — FERRITIN: Ferritin: 26 ng/mL (ref 11–307)

## 2018-04-24 LAB — IRON AND TIBC
Iron: 110 ug/dL (ref 28–170)
Saturation Ratios: 41 % — ABNORMAL HIGH (ref 10.4–31.8)
TIBC: 267 ug/dL (ref 250–450)
UIBC: 157 ug/dL

## 2018-05-08 ENCOUNTER — Other Ambulatory Visit: Payer: Self-pay | Admitting: Family Medicine

## 2018-05-08 DIAGNOSIS — G4700 Insomnia, unspecified: Secondary | ICD-10-CM

## 2018-05-11 ENCOUNTER — Ambulatory Visit: Admission: RE | Admit: 2018-05-11 | Payer: Medicare Other | Source: Ambulatory Visit

## 2018-05-17 ENCOUNTER — Ambulatory Visit (INDEPENDENT_AMBULATORY_CARE_PROVIDER_SITE_OTHER): Payer: Medicare Other | Admitting: Family Medicine

## 2018-05-17 ENCOUNTER — Encounter: Payer: Self-pay | Admitting: Family Medicine

## 2018-05-17 VITALS — BP 176/74 | HR 68 | Temp 98.5°F | Resp 16 | Wt 116.0 lb

## 2018-05-17 DIAGNOSIS — G8929 Other chronic pain: Secondary | ICD-10-CM

## 2018-05-17 DIAGNOSIS — I10 Essential (primary) hypertension: Secondary | ICD-10-CM | POA: Diagnosis not present

## 2018-05-17 DIAGNOSIS — I129 Hypertensive chronic kidney disease with stage 1 through stage 4 chronic kidney disease, or unspecified chronic kidney disease: Secondary | ICD-10-CM

## 2018-05-17 DIAGNOSIS — N184 Chronic kidney disease, stage 4 (severe): Secondary | ICD-10-CM | POA: Diagnosis not present

## 2018-05-17 DIAGNOSIS — J329 Chronic sinusitis, unspecified: Secondary | ICD-10-CM

## 2018-05-17 DIAGNOSIS — R600 Localized edema: Secondary | ICD-10-CM | POA: Diagnosis not present

## 2018-05-17 DIAGNOSIS — R51 Headache: Secondary | ICD-10-CM | POA: Diagnosis not present

## 2018-05-17 MED ORDER — AMOXICILLIN-POT CLAVULANATE 875-125 MG PO TABS: ORAL_TABLET | ORAL | 0 refills | Status: DC

## 2018-05-17 MED ORDER — BUTALBITAL-APAP-CAFFEINE 50-325-40 MG PO TABS: 1.0000 | ORAL_TABLET | ORAL | 3 refills | Status: DC | PRN

## 2018-05-17 MED ORDER — FLUTICASONE PROPIONATE 50 MCG/ACT NA SUSP: 2.0000 | Freq: Every day | NASAL | 6 refills | Status: DC

## 2018-05-17 NOTE — Progress Notes (Signed)
Patient: Amy [name] Female    DOB: 1950/07/08   68 y.o.   MRN: [id number] Visit Date: 05/17/2018  Today's Provider: Lelon Huh, MD   Chief Complaint  Patient presents with  . URI    Started about two days ago.   Subjective:     URI   This is a new problem. The current episode started in the past 7 days. The problem has been gradually worsening. There has been no fever. Associated symptoms include congestion, coughing, headaches, a plugged ear sensation, rhinorrhea, sinus pain and a sore throat. Pertinent negatives include no abdominal pain, diarrhea, ear pain, nausea, neck pain, swollen glands or wheezing. She has tried nothing for the symptoms.   Follow up hypertension She continues to have elevated blood pressures. She had follow up with Dr. Rockey Situ last month and had 5mg  amlodipine discontinued due to lower extremity edema. Clonidine was doubled to 0.2mg  twice a day and she was advised to take 50mg  hydralazine twice EVERY day instead of 1/2 tablet prn.   Allergies  Allergen Reactions  . Cephalosporins   . Clarithromycin   . Lunesta  [Eszopiclone]     Heart racing  . Penicillin V     Has patient had a PCN reaction causing immediate rash, facial/tongue/throat swelling, SOB or lightheadedness with hypotension: Yes Has patient had a PCN reaction causing severe rash involving mucus membranes or skin necrosis: No Has patient had a PCN reaction that required hospitalization No Has patient had a PCN reaction occurring within the last 10 years: No If all of the above answers are "NO", then may proceed with Cephalosporin use.  . Sulfa Antibiotics      Current Outpatient Medications:  .  aspirin EC 81 MG EC tablet, Take 1 tablet (81 mg total) by mouth daily., Disp: , Rfl:  .  buPROPion (WELLBUTRIN) 75 MG tablet, Take 1 tablet (75 mg total) by mouth 2 (two) times daily. (Patient taking differently: Take 150 mg by mouth daily. ), Disp: 180 tablet, Rfl: 4 .   butalbital-acetaminophen-caffeine (FIORICET, ESGIC) 50-325-40 MG tablet, TAKE 1 TO 2 TABLETS BY MOUTH EVERY 4 TO 6 HOURS AS NEEDED FOR PAIN. MAX OF 8 PER DAY, Disp: 150 tablet, Rfl: 3 .  carvedilol (COREG) 25 MG tablet, TAKE 1 TABLET(25 MG) BY MOUTH TWICE DAILY WITH A MEAL, Disp: 60 tablet, Rfl: 5 .  cloNIDine (CATAPRES) 0.2 MG tablet, Take 1 tablet (0.2 mg total) by mouth 2 (two) times daily., Disp: 180 tablet, Rfl: 3 .  furosemide (LASIX) 40 MG tablet, furosemide 40 mg tablet , Take 1 tablet twice a day (per patient report and Dr. Ledell Noss notes 10/2018) .  hydrALAZINE (APRESOLINE) 50 MG tablet, Take 1 tablet (50 mg total) by mouth 3 (three) times daily. if your Systolic YP>950 or Diastolic DT>267, Disp: 3 tablet, Rfl: 1 .  LORazepam (ATIVAN) 1 MG tablet, Take 1 tablet (1 mg total) by mouth 2 (two) times daily as needed., Disp: 180 tablet, Rfl: 1 .  quinapril (ACCUPRIL) 10 MG tablet, Take 10 mg by mouth daily. , Disp: , Rfl:  .  ranitidine (ZANTAC) 150 MG tablet, Take 150 mg by mouth 2 (two) times daily., Disp: , Rfl:  .  valACYclovir (VALTREX) 1000 MG tablet, 2 tablets twice a day for 1 day as needed for cold sores, Disp: 20 tablet, Rfl: 1 .  zolpidem (AMBIEN) 10 MG tablet, TAKE 1 TABLET BY MOUTH AT BEDTIME AS NEEDED., Disp: 30 tablet, Rfl:  3 .  amoxicillin-clavulanate (AUGMENTIN) 875-125 MG tablet, TAKE 1 TABLET BY MOUTH TWICE DAILY. TAKE WITH AMOXICILLIN 875, Disp: 20 tablet, Rfl: 0  Review of Systems  Constitutional: Positive for fatigue. Negative for activity change, appetite change, chills, diaphoresis, fever and unexpected weight change.  HENT: Positive for congestion, postnasal drip, rhinorrhea, sinus pressure, sinus pain and sore throat. Negative for ear discharge, ear pain, hearing loss and tinnitus.   Eyes: Negative.   Respiratory: Positive for cough and chest tightness. Negative for apnea, choking, shortness of breath, wheezing and stridor.   Gastrointestinal: Negative.  Negative for  abdominal pain, diarrhea and nausea.  Musculoskeletal: Negative for neck pain.  Neurological: Positive for light-headedness and headaches. Negative for dizziness.    Social History   Tobacco Use  . Smoking status: Never Smoker  . Smokeless tobacco: Never Used  Substance Use Topics  . Alcohol use: Yes    Alcohol/week: 0.0 - 2.0 standard drinks      Objective:   BP (!) 176/74 (BP Location: Right Arm, Patient Position: Sitting, Cuff Size: Normal)   Pulse 68   Temp 98.5 F (36.9 C) (Oral)   Resp 16   Wt 116 lb (52.6 kg)   BMI 21.92 kg/m  Vitals:   05/17/18 1137  BP: (!) 176/74  Pulse: 68  Resp: 16  Temp: 98.5 F (36.9 C)  TempSrc: Oral  Weight: 116 lb (52.6 kg)     Physical Exam  General Appearance:    Alert, cooperative, no distress  HENT:   bilateral TM normal without fluid or infection, neck without nodes, frontal sinus tender and nasal mucosa pale and congested  Eyes:    PERRL, conjunctiva/corneas clear, EOM's intact       Lungs:     Clear to auscultation bilaterally, respirations unlabored  Heart:    Regular rate and rhythm. 1+ bipedal edema.  Neurologic:   Awake, alert, oriented x 3. No apparent focal neurological           defect.           Assessment & Plan    1. Essential hypertension Uncontrolled. Recently taken off of amlodipine and dose of clonidine and hydralazine doubled. She usually seems a little confused about her medication regiment so will if we can get by without adding any additional medications. Consider increasing hydralazine or clonidine after reviewing labs.  - Renal function panel  2. Recurrent sinus infections  - amoxicillin-clavulanate (AUGMENTIN) 875-125 MG tablet; TAKE 1 TABLET BY MOUTH TWICE DAILY  Dispense: 20 tablet; Refill: 0 - fluticasone (FLONASE) 50 MCG/ACT nasal spray; Place 2 sprays into both nostrils daily.  Dispense: 16 g; Refill: 6  3. CKD stage 4 secondary to hypertension ([place]) Has not seen nephrology for several  months and does not currently have follow up scheduled. Is due to have renal functions check and send to labs today.   4. Localized edema Likely secondary to CKD anemia. Currently off of amlodipine, but consider restarting at lower dose if unable to control BP with current medications.   5. Chronic nonintractable headache, unspecified headache type refill- butalbital-acetaminophen-caffeine (FIORICET, ESGIC) 50-325-40 MG tablet; Take 1-2 tablets by mouth every 4 (four) hours as needed for headache.  Dispense: 150 tablet; Refill: 3  Review of medication dispense database reveals that she has been prescribed naproxen twice a day last dispensed 60 tablets on December 30th, 2019. This was discovered after patient left office. Will advise her that she can not take NSAIDs when we contact her  with lab results.     Lelon Huh, MD  [facility]

## 2018-05-17 NOTE — Patient Instructions (Signed)
.   Please bring all of your medications to every appointment so we can make sure that our medication list is the same as yours.   

## 2018-05-18 ENCOUNTER — Telehealth: Payer: Self-pay

## 2018-05-18 LAB — RENAL FUNCTION PANEL
Albumin: 3.7 g/dL (ref 3.6–4.8)
BUN / CREAT RATIO: 13 (ref 12–28)
BUN: 45 mg/dL — ABNORMAL HIGH (ref 8–27)
CO2: 16 mmol/L — ABNORMAL LOW (ref 20–29)
Calcium: 8.7 mg/dL (ref 8.7–10.3)
Chloride: 102 mmol/L (ref 96–106)
Creatinine, Ser: 3.5 mg/dL — ABNORMAL HIGH (ref 0.57–1.00)
GFR calc Af Amer: 15 mL/min/{1.73_m2} — ABNORMAL LOW (ref >59)
GFR calc non Af Amer: 13 mL/min/{1.73_m2} — ABNORMAL LOW (ref >59)
GLUCOSE: 91 mg/dL (ref 65–99)
Phosphorus: 4.4 mg/dL — ABNORMAL HIGH (ref 3.0–4.3)
Potassium: 5.3 mmol/L — ABNORMAL HIGH (ref 3.5–5.2)
Sodium: 133 mmol/L — ABNORMAL LOW (ref 134–144)

## 2018-05-18 NOTE — Telephone Encounter (Signed)
-----   Message from [name], MD sent at 05/18/2018  7:51 AM EST ----- Kidney functions have gotten worse. I noticed that she has been getting prescription for naproxen from orthopedist. If she is taking this she needs to stop immediately. It can increase her blood pressure and worsen kidney functions. She should NOT take any NSAIDs including ibuprofen or Aleve.  Increase hydralazine to 50mg  every eight hours on schedule, continue 0.2mg  clonidine. She needs to schedule follow up with Dr. Candiss Norse (her nephrologist) Follow up here for BP check 3-4 weeks.

## 2018-05-18 NOTE — Telephone Encounter (Signed)
Patient advised of results and verbally voiced understanding. Patient agrees to changes as below. Patient will call Dr. Candiss Norse to schedule follow up appointment. I scheduled patient a  follow up appointment here for blood pressure on 06/12/2018 at 3:20pm.

## 2018-05-24 DIAGNOSIS — M1711 Unilateral primary osteoarthritis, right knee: Secondary | ICD-10-CM | POA: Diagnosis not present

## 2018-05-24 DIAGNOSIS — S83207D Unspecified tear of unspecified meniscus, current injury, left knee, subsequent encounter: Secondary | ICD-10-CM | POA: Diagnosis not present

## 2018-05-28 NOTE — Progress Notes (Signed)
[facility]  [phone] [phone]  ID: [name] OB: 09-24-1950  MR#: [id number]  IEP#:[id number]  Patient Care Team: [name], MD as PCP - General (Family Medicine) Thornton Park, MD as Referring Physician (Orthopedic Surgery) Lloyd Huger, MD as Consulting Physician (Oncology) Murlean Iba, MD as Referring Physician (Nephrology)  CHIEF COMPLAINT:  Anemia in stage IV chronic kidney disease.  INTERVAL HISTORY: Patient returns to clinic today for repeat laboratory work, further evaluation, and continuation of Procrit.  She continues to have chronic weakness and fatigue, but has noticed a slight decline in the past several weeks.  She otherwise feels well. She has no neurologic complaints.  She denies any recent fevers or illnesses.  She has a good appetite and her weight has remained stable.  She has no chest pain or shortness of breath. She denies any nausea, vomiting, constipation, or diarrhea.  She denies any melena or hematochezia.  She has no urinary complaints.  Patient offers no further specific complaints today.  REVIEW OF SYSTEMS:   Review of Systems  Constitutional: Positive for malaise/fatigue. Negative for fever and weight loss.  Respiratory: Negative.  Negative for cough and shortness of breath.   Cardiovascular: Negative.  Negative for chest pain and leg swelling.  Gastrointestinal: Negative.  Negative for abdominal pain, blood in stool and melena.  Genitourinary: Negative.  Negative for dysuria and hematuria.  Musculoskeletal: Negative.  Negative for back pain.  Skin: Negative.  Negative for rash.  Neurological: Positive for weakness. Negative for sensory change and focal weakness.  Psychiatric/Behavioral: Negative.  The patient is not nervous/anxious.     As per HPI. Otherwise, a complete review of systems is negative.  PAST MEDICAL HISTORY: Past Medical History:  Diagnosis Date  . Anemia    a. 08/2016  Guaiac + stool. EGD w/ gastritis.  . Anxiety   . CKD (chronic kidney disease), stage III ([place])   . Colon polyp    a. 08/2016 Colonoscopy.  . Gastritis    a. 08/2016 EGD: Gastritis-->PPI.  [name] GERD (gastroesophageal reflux disease)   . History of chicken pox   . History of measles as a child   . Hypertension   . Hyponatremia    a. 08/2016 in setting of HCTZ Rx.  . Multiple thyroid nodules     PAST SURGICAL HISTORY: Past Surgical History:  Procedure Laterality Date  . ABDOMINAL HYSTERECTOMY  2003   due to heavy periods and clotting during menses  . BREAST LUMPECTOMY Left 1990  . CHOLECYSTECTOMY  2010  . COLONOSCOPY WITH PROPOFOL N/A 09/07/2016   Procedure: COLONOSCOPY WITH PROPOFOL;  Surgeon: Lucilla Lame, MD;  Location: [facility] ENDOSCOPY;  Service: Endoscopy;  Laterality: N/A;  . DILATION AND CURETTAGE OF UTERUS  1985  . ESOPHAGOGASTRODUODENOSCOPY (EGD) WITH PROPOFOL N/A 09/07/2016   Procedure: ESOPHAGOGASTRODUODENOSCOPY (EGD) WITH PROPOFOL;  Surgeon: Lucilla Lame, MD;  Location: [facility] ENDOSCOPY;  Service: Endoscopy;  Laterality: N/A;  . GALLBLADDER SURGERY  2012  . KNEE SURGERY     Dr. Tamala Julian  . MRI, Lumbar spine  07/23/2009   Multilevel annular bulge and disc protrusion at L2-L3, L3-L4, L4-L5, and L5-S1  . TONSILLECTOMY    . TONSILLECTOMY AND ADENOIDECTOMY  1970  . UPPER GI ENDOSCOPY  03/02/2006   H. Pylori negative; Dr. Allen Norris    FAMILY HISTORY: Family History  Problem Relation Age of Onset  . Pancreatic cancer Father   . Congestive Heart Failure Maternal Grandmother   . Cancer Paternal Grandfather   .  Heart disease Maternal Uncle     ADVANCED DIRECTIVES (Y/N):  N  HEALTH MAINTENANCE: Social History   Tobacco Use  . Smoking status: Never Smoker  . Smokeless tobacco: Never Used  Substance Use Topics  . Alcohol use: Yes    Alcohol/week: 0.0 - 2.0 standard drinks  . Drug use: No     Colonoscopy:  PAP:  Bone density:  Lipid panel:  Allergies  Allergen Reactions  .  Cephalosporins   . Clarithromycin   . Lunesta  [Eszopiclone]     Heart racing  . Penicillin V     Has patient had a PCN reaction causing immediate rash, facial/tongue/throat swelling, SOB or lightheadedness with hypotension: Yes Has patient had a PCN reaction causing severe rash involving mucus membranes or skin necrosis: No Has patient had a PCN reaction that required hospitalization No Has patient had a PCN reaction occurring within the last 10 years: No If all of the above answers are "NO", then may proceed with Cephalosporin use.  . Sulfa Antibiotics     Current Outpatient Medications  Medication Sig Dispense Refill  . aspirin EC 81 MG EC tablet Take 1 tablet (81 mg total) by mouth daily.    [name] buPROPion (WELLBUTRIN) 75 MG tablet Take 1 tablet (75 mg total) by mouth 2 (two) times daily. (Patient taking differently: Take 150 mg by mouth daily. ) 180 tablet 4  . butalbital-acetaminophen-caffeine (FIORICET, ESGIC) 50-325-40 MG tablet Take 1-2 tablets by mouth every 4 (four) hours as needed for headache. 150 tablet 3  . carvedilol (COREG) 25 MG tablet TAKE 1 TABLET(25 MG) BY MOUTH TWICE DAILY WITH A MEAL 60 tablet 5  . cloNIDine (CATAPRES) 0.2 MG tablet Take 1 tablet (0.2 mg total) by mouth 2 (two) times daily. 180 tablet 3  . fluticasone (FLONASE) 50 MCG/ACT nasal spray Place 2 sprays into both nostrils daily. 16 g 6  . hydrALAZINE (APRESOLINE) 50 MG tablet Take 1 tablet (50 mg total) by mouth 3 (three) times daily. if your Systolic SM>270 or Diastolic BE>675 3 tablet 1  . LORazepam (ATIVAN) 1 MG tablet Take 1 tablet (1 mg total) by mouth 2 (two) times daily as needed. 180 tablet 1  . quinapril (ACCUPRIL) 5 MG tablet Take 10 mg by mouth daily.     . ranitidine (ZANTAC) 150 MG tablet Take 150 mg by mouth 2 (two) times daily.    [name] zolpidem (AMBIEN) 10 MG tablet TAKE 1 TABLET BY MOUTH AT BEDTIME AS NEEDED. 30 tablet 3  . furosemide (LASIX) 40 MG tablet Take 40 mg by mouth 2 (two) times daily.      . valACYclovir (VALTREX) 1000 MG tablet 2 tablets twice a day for 1 day as needed for cold sores (Patient not taking: Reported on 05/29/2018) 20 tablet 1   No current facility-administered medications for this visit.     OBJECTIVE: Vitals:   05/29/18 0937 05/29/18 1012  BP: (!) 192/84 (!) 183/85  Pulse: 80   Temp:       Body mass index is 22.43 kg/m.    ECOG FS:0 - Asymptomatic  General: Well-developed, well-nourished, no acute distress. Eyes: Pink conjunctiva, anicteric sclera. HEENT: Normocephalic, moist mucous membranes. Lungs: Clear to auscultation bilaterally. Heart: Regular rate and rhythm. No rubs, murmurs, or gallops. Abdomen: Soft, nontender, nondistended. No organomegaly noted, normoactive bowel sounds. Musculoskeletal: No edema, cyanosis, or clubbing. Neuro: Alert, answering all questions appropriately. Cranial nerves grossly intact. Skin: No rashes or petechiae noted. Psych: Normal affect.  LAB RESULTS:  Lab Results  Component Value Date   NA 133 (L) 05/17/2018   K 5.3 (H) 05/17/2018   CL 102 05/17/2018   CO2 16 (L) 05/17/2018   GLUCOSE 91 05/17/2018   BUN 45 (H) 05/17/2018   CREATININE 3.50 (H) 05/17/2018   CALCIUM 8.7 05/17/2018   PROT 5.5 (L) 12/21/2017   ALBUMIN 3.7 05/17/2018   AST 15 12/21/2017   ALT 12 12/21/2017   ALKPHOS 131 (H) 12/21/2017   BILITOT <0.2 12/21/2017   GFRNONAA 13 (L) 05/17/2018   GFRAA 15 (L) 05/17/2018    Lab Results  Component Value Date   WBC 6.1 05/29/2018   NEUTROABS 4.4 05/29/2018   HGB 7.1 (L) 05/29/2018   HCT 22.9 (L) 05/29/2018   MCV 91.6 05/29/2018   PLT 254 05/29/2018   Lab Results  Component Value Date   IRON 110 04/24/2018   TIBC 267 04/24/2018   IRONPCTSAT 41 (H) 04/24/2018   Lab Results  Component Value Date   FERRITIN 26 04/24/2018     STUDIES: No results found.  ASSESSMENT: Anemia in stage IV chronic kidney disease.  PLAN:    1. Anemia in stage IV chronic kidney disease: Patient's most  recent iron stores from April 24, 2018 continues to be within normal limits.  She had a normal colonoscopy and EGD on Sep 07, 2016. CT of the abdomen and pelvis was essentially within normal limits.  Patient has chronic renal insufficiency which is likely contributing to her anemia.  Previously, her erythropoietin level was inappropriately low.  Her reticulocyte count was also low, possibly indicating an underlying bone marrow disorder.  After discussion with the patient, will hold on doing a bone marrow biopsy at this time the patient expressed understanding that it might be necessary in the future.  Because of patient's ongoing issues with hypertension, she has not received Procrit since April 06, 2018.  Her hemoglobin has significantly dropped and she is symptomatic today.  Hold Procrit and return to clinic in 2 days for 1 unit packed red blood cells.  Patient will then continue to return to clinic every 3 weeks for laboratory work and Procrit if her blood pressure is adequately controlled and her hemoglobin remains below 10.0.  Return to clinic in 4 months for further evaluation.   2. Chronic renal insufficiency: Chronic and unchanged.  Continue treatment and evaluation per nephrology. 3.  Hypertension: Patient's blood pressure is significantly elevated today.  Delay Procrit as above.  Continue follow-up and treatment per nephrology.  I spent a total of 30 minutes face-to-face with the patient of which greater than 50% of the visit was spent in counseling and coordination of care as detailed above.   Patient expressed understanding and was in agreement with this plan. She also understands that She can call clinic at any time with any questions, concerns, or complaints.   Lloyd Huger, MD   05/29/2018 12:07 PM

## 2018-05-29 ENCOUNTER — Other Ambulatory Visit: Payer: Self-pay

## 2018-05-29 ENCOUNTER — Inpatient Hospital Stay: Payer: Medicare Other | Attending: Oncology

## 2018-05-29 ENCOUNTER — Inpatient Hospital Stay: Payer: Medicare Other

## 2018-05-29 ENCOUNTER — Other Ambulatory Visit: Payer: Self-pay | Admitting: *Deleted

## 2018-05-29 ENCOUNTER — Inpatient Hospital Stay (HOSPITAL_BASED_OUTPATIENT_CLINIC_OR_DEPARTMENT_OTHER): Payer: Medicare Other | Admitting: Oncology

## 2018-05-29 VITALS — BP 183/85 | HR 80 | Temp 97.5°F | Ht 61.0 in | Wt 118.7 lb

## 2018-05-29 DIAGNOSIS — N184 Chronic kidney disease, stage 4 (severe): Secondary | ICD-10-CM

## 2018-05-29 DIAGNOSIS — Z7982 Long term (current) use of aspirin: Secondary | ICD-10-CM

## 2018-05-29 DIAGNOSIS — Z79899 Other long term (current) drug therapy: Secondary | ICD-10-CM | POA: Insufficient documentation

## 2018-05-29 DIAGNOSIS — D631 Anemia in chronic kidney disease: Secondary | ICD-10-CM

## 2018-05-29 DIAGNOSIS — I129 Hypertensive chronic kidney disease with stage 1 through stage 4 chronic kidney disease, or unspecified chronic kidney disease: Secondary | ICD-10-CM | POA: Diagnosis not present

## 2018-05-29 DIAGNOSIS — F419 Anxiety disorder, unspecified: Secondary | ICD-10-CM | POA: Diagnosis not present

## 2018-05-29 LAB — CBC WITH DIFFERENTIAL/PLATELET
Abs Immature Granulocytes: 0.03 10*3/uL (ref 0.00–0.07)
Basophils Absolute: 0.1 10*3/uL (ref 0.0–0.1)
Basophils Relative: 1 %
EOS PCT: 6 %
Eosinophils Absolute: 0.3 10*3/uL (ref 0.0–0.5)
HCT: 22.9 % — ABNORMAL LOW (ref 36.0–46.0)
Hemoglobin: 7.1 g/dL — ABNORMAL LOW (ref 12.0–15.0)
Immature Granulocytes: 1 %
LYMPHS PCT: 14 %
Lymphs Abs: 0.8 10*3/uL (ref 0.7–4.0)
MCH: 28.4 pg (ref 26.0–34.0)
MCHC: 31 g/dL (ref 30.0–36.0)
MCV: 91.6 fL (ref 80.0–100.0)
Monocytes Absolute: 0.4 10*3/uL (ref 0.1–1.0)
Monocytes Relative: 7 %
Neutro Abs: 4.4 10*3/uL (ref 1.7–7.7)
Neutrophils Relative %: 71 %
Platelets: 254 10*3/uL (ref 150–400)
RBC: 2.5 MIL/uL — ABNORMAL LOW (ref 3.87–5.11)
RDW: 15.6 % — ABNORMAL HIGH (ref 11.5–15.5)
WBC: 6.1 10*3/uL (ref 4.0–10.5)
nRBC: 0 % (ref 0.0–0.2)

## 2018-05-29 LAB — PREPARE RBC (CROSSMATCH)

## 2018-05-29 NOTE — Progress Notes (Signed)
Patient is here today to follow up on her anemia in stage 4 chronic kidney disease. Patient stated that she stays tried and fatigued all the time. Patient's blood pressure stays elevated but saw her PCP last week and he adjusted her medication and she had not noticed any difference. Patient stated that she would call her PCP to let him know.

## 2018-05-30 ENCOUNTER — Other Ambulatory Visit: Payer: Self-pay | Admitting: Family Medicine

## 2018-05-30 ENCOUNTER — Telehealth: Payer: Self-pay | Admitting: Family Medicine

## 2018-05-30 MED ORDER — CLONIDINE HCL 0.3 MG PO TABS: 0.3000 mg | ORAL_TABLET | Freq: Three times a day (TID) | ORAL | 1 refills | Status: DC

## 2018-05-30 MED ORDER — AMLODIPINE BESYLATE 5 MG PO TABS: 5.0000 mg | ORAL_TABLET | Freq: Every day | ORAL | 1 refills | Status: DC

## 2018-05-30 NOTE — Telephone Encounter (Signed)
Pt advised.  She states Dr Rockey Situ had her stop Amlodipine because "it is not kidney friendly".  She is worried that it is going to cause more damage to her kidneys.  She wanted to know if there was something else she could try.    Pre Op appointment scheduled for 06/06/2018 at 1:40.   Thanks,   -Mickel Baas

## 2018-05-30 NOTE — Telephone Encounter (Signed)
He just stopped the amlodipine due to swelling so she wouldn't have to take so much of the Lasix. The lasix is not kidney friendly, but the amlodipine doesn't cause any kidney problems There really aren't any other medications that will get BP down in time for her surgery .

## 2018-05-30 NOTE — Telephone Encounter (Signed)
We received request for surgery clearance from Dr. Harlow Mares, but patients BP is still too high for surgery. She need to increase clonidine to 0.3mg  every eight hours, #90 rf x 1 and restart amlodipine 5mg  once a day. I know amlodipine causes swelling, but swelling is not dangerous and she won't be able to have surgery if her BP does not come down Need to schedule follow up o.v for BP check and pre-op next week.

## 2018-05-30 NOTE — Telephone Encounter (Signed)
Pt advised.  She agreed to restart Amlodipine sent refill to [facility].   Thanks,   -Mickel Baas

## 2018-05-31 ENCOUNTER — Inpatient Hospital Stay: Payer: Medicare Other

## 2018-05-31 VITALS — BP 150/69 | HR 70 | Temp 96.7°F | Resp 18

## 2018-05-31 DIAGNOSIS — D509 Iron deficiency anemia, unspecified: Secondary | ICD-10-CM

## 2018-05-31 DIAGNOSIS — N184 Chronic kidney disease, stage 4 (severe): Principal | ICD-10-CM

## 2018-05-31 DIAGNOSIS — D631 Anemia in chronic kidney disease: Secondary | ICD-10-CM | POA: Diagnosis not present

## 2018-05-31 DIAGNOSIS — I129 Hypertensive chronic kidney disease with stage 1 through stage 4 chronic kidney disease, or unspecified chronic kidney disease: Secondary | ICD-10-CM | POA: Diagnosis not present

## 2018-05-31 DIAGNOSIS — Z79899 Other long term (current) drug therapy: Secondary | ICD-10-CM | POA: Diagnosis not present

## 2018-05-31 DIAGNOSIS — F419 Anxiety disorder, unspecified: Secondary | ICD-10-CM | POA: Diagnosis not present

## 2018-05-31 DIAGNOSIS — Z7982 Long term (current) use of aspirin: Secondary | ICD-10-CM | POA: Diagnosis not present

## 2018-05-31 MED ORDER — EPOETIN ALFA 40000 UNIT/ML IJ SOLN
40000.0000 [IU] | Freq: Once | INTRAMUSCULAR | Status: AC
  Administered 2018-05-31: 40000 [IU] via SUBCUTANEOUS
  Filled 2018-05-31: qty 1

## 2018-05-31 MED ORDER — SODIUM CHLORIDE 0.9% IV SOLUTION
250.0000 mL | Freq: Once | INTRAVENOUS | Status: AC
  Administered 2018-05-31: 250 mL via INTRAVENOUS
  Filled 2018-05-31: qty 250

## 2018-05-31 MED ORDER — DIPHENHYDRAMINE HCL 50 MG/ML IJ SOLN
25.0000 mg | Freq: Once | INTRAMUSCULAR | Status: AC
  Administered 2018-05-31: 25 mg via INTRAVENOUS
  Filled 2018-05-31: qty 1

## 2018-05-31 MED ORDER — ACETAMINOPHEN 325 MG PO TABS
650.0000 mg | ORAL_TABLET | Freq: Once | ORAL | Status: AC
  Administered 2018-05-31: 650 mg via ORAL
  Filled 2018-05-31: qty 2

## 2018-06-01 DIAGNOSIS — H5203 Hypermetropia, bilateral: Secondary | ICD-10-CM | POA: Diagnosis not present

## 2018-06-01 DIAGNOSIS — H52229 Regular astigmatism, unspecified eye: Secondary | ICD-10-CM | POA: Diagnosis not present

## 2018-06-01 DIAGNOSIS — H2510 Age-related nuclear cataract, unspecified eye: Secondary | ICD-10-CM | POA: Diagnosis not present

## 2018-06-01 LAB — TYPE AND SCREEN
ABO/RH(D): O POS
Antibody Screen: NEGATIVE
Unit division: 0

## 2018-06-01 LAB — BPAM RBC
Blood Product Expiration Date: 202002232359
ISSUE DATE / TIME: 202001291033
Unit Type and Rh: 5100

## 2018-06-06 ENCOUNTER — Ambulatory Visit: Payer: Medicare Other | Admitting: Family Medicine

## 2018-06-08 ENCOUNTER — Other Ambulatory Visit: Payer: Self-pay | Admitting: Nephrology

## 2018-06-08 ENCOUNTER — Ambulatory Visit: Payer: Medicare Other

## 2018-06-08 DIAGNOSIS — R809 Proteinuria, unspecified: Secondary | ICD-10-CM | POA: Diagnosis not present

## 2018-06-08 DIAGNOSIS — I129 Hypertensive chronic kidney disease with stage 1 through stage 4 chronic kidney disease, or unspecified chronic kidney disease: Secondary | ICD-10-CM | POA: Diagnosis not present

## 2018-06-08 DIAGNOSIS — I120 Hypertensive chronic kidney disease with stage 5 chronic kidney disease or end stage renal disease: Secondary | ICD-10-CM

## 2018-06-08 DIAGNOSIS — R600 Localized edema: Secondary | ICD-10-CM | POA: Diagnosis not present

## 2018-06-08 DIAGNOSIS — N185 Chronic kidney disease, stage 5: Principal | ICD-10-CM

## 2018-06-10 ENCOUNTER — Other Ambulatory Visit: Payer: Self-pay | Admitting: Family Medicine

## 2018-06-12 ENCOUNTER — Ambulatory Visit (INDEPENDENT_AMBULATORY_CARE_PROVIDER_SITE_OTHER): Payer: Medicare Other | Admitting: Family Medicine

## 2018-06-12 ENCOUNTER — Ambulatory Visit: Payer: Medicare Other

## 2018-06-12 ENCOUNTER — Encounter: Payer: Self-pay | Admitting: Family Medicine

## 2018-06-12 VITALS — BP 142/70 | HR 69 | Temp 98.0°F | Resp 16 | Wt 117.0 lb

## 2018-06-12 DIAGNOSIS — Z01818 Encounter for other preprocedural examination: Secondary | ICD-10-CM | POA: Diagnosis not present

## 2018-06-12 DIAGNOSIS — I10 Essential (primary) hypertension: Secondary | ICD-10-CM

## 2018-06-12 DIAGNOSIS — D649 Anemia, unspecified: Secondary | ICD-10-CM

## 2018-06-12 DIAGNOSIS — N185 Chronic kidney disease, stage 5: Secondary | ICD-10-CM | POA: Diagnosis not present

## 2018-06-12 NOTE — Progress Notes (Signed)
Patient: Amy [name] Female    DOB: December 17, 1950   68 y.o.   MRN: [id number] Visit Date: 06/12/2018  Today's Provider: Lelon Huh, MD   Chief Complaint  Patient presents with  . Medical Clearance   Subjective:   HPI Surgical Clearance: Patient plans to have left knee arthroscopy with Dr. Harlow Mares. Patient will be placed under general Anesthesia. She reports surgery is scheduled out ambulatory surgery center on 06/15/2018. She denies any history of surgical or anesthetic complications. She denies any chest pains, dyspnea, light headedness or palpations.     Hypertension, follow-up:  BP Readings from Last 3 Encounters:  06/12/18 (!) 142/70  05/31/18 (!) 150/69  05/29/18 (!) 183/85     Most recent change includes restarting Amlodipine 25m daily, and increasing Clonidine to 0.32mevery 8 hours. Patient was advised to follow up in 1 week after changes were made. She reports good compliance with treatment.   She had follow up with Dr. SiCandiss Norseast week and changed Furosemide to torsemide due to swelling. Had labs drawn as well but patient has not heard results.  She is not having side effects.  She is not exercising. She is not adherent to low salt diet.   Outside blood pressures are not being checked. She is experiencing lower extremity edema.  Patient denies chest pain, chest pressure/discomfort, claudication, dyspnea, exertional chest pressure/discomfort, fatigue, irregular heart beat, near-syncope, orthopnea, palpitations, paroxysmal nocturnal dyspnea, syncope and tachypnea.   Cardiovascular risk factors include advanced age (older than 5524or men, 6580or women) and hypertension.  Use of agents associated with hypertension: NSAIDS.     Weight trend: stable Wt Readings from Last 3 Encounters:  06/12/18 117 lb (53.1 kg)  05/29/18 118 lb 11.2 oz (53.8 kg)  05/17/18 116 lb (52.6 kg)    Current diet: regular diet   She also has chronic anemia likely secondary to CKD  followed by Dr. FiGrayland OrmondHer last hgb  On 05-29-2018 was 7.1 after which she received blood transusion.     Allergies  Allergen Reactions  . Cephalosporins   . Clarithromycin   . Lunesta  [Eszopiclone]     Heart racing  . Penicillin V     Has patient had a PCN reaction causing immediate rash, facial/tongue/throat swelling, SOB or lightheadedness with hypotension: Yes Has patient had a PCN reaction causing severe rash involving mucus membranes or skin necrosis: No Has patient had a PCN reaction that required hospitalization No Has patient had a PCN reaction occurring within the last 10 years: No If all of the above answers are "NO", then may proceed with Cephalosporin use.  . Sulfa Antibiotics      Current Outpatient Medications:  .  amLODipine (NORVASC) 5 MG tablet, TAKE 1 TABLET(5 MG) BY MOUTH DAILY, Disp: 90 tablet, Rfl: 1 .  aspirin EC 81 MG EC tablet, Take 1 tablet (81 mg total) by mouth daily., Disp: , Rfl:  .  buPROPion (WELLBUTRIN) 75 MG tablet, Take 1 tablet (75 mg total) by mouth 2 (two) times daily. (Patient taking differently: Take 150 mg by mouth daily. ), Disp: 180 tablet, Rfl: 4 .  butalbital-acetaminophen-caffeine (FIORICET, ESGIC) 50-325-40 MG tablet, Take 1-2 tablets by mouth every 4 (four) hours as needed for headache., Disp: 150 tablet, Rfl: 3 .  carvedilol (COREG) 25 MG tablet, TAKE 1 TABLET(25 MG) BY MOUTH TWICE DAILY WITH A MEAL, Disp: 180 tablet, Rfl: 1 .  cloNIDine (CATAPRES) 0.3 MG tablet, Take 1 tablet (  0.3 mg total) by mouth every 8 (eight) hours., Disp: 90 tablet, Rfl: 1 .  fluticasone (FLONASE) 50 MCG/ACT nasal spray, Place 2 sprays into both nostrils daily., Disp: 16 g, Rfl: 6 .  hydrALAZINE (APRESOLINE) 50 MG tablet, Take 1 tablet (50 mg total) by mouth 3 (three) times daily. if your Systolic JH>417 or Diastolic EY>814, Disp: 3 tablet, Rfl: 1 .  LORazepam (ATIVAN) 1 MG tablet, Take 1 tablet (1 mg total) by mouth 2 (two) times daily as needed., Disp: 180  tablet, Rfl: 1 .  quinapril (ACCUPRIL) 5 MG tablet, Take 10 mg by mouth daily. , Disp: , Rfl:  .  ranitidine (ZANTAC) 150 MG tablet, Take 150 mg by mouth 2 (two) times daily., Disp: , Rfl:  .  torsemide (DEMADEX) 20 MG tablet, Take 40 mg by mouth every morning., Disp: , Rfl:  .  zolpidem (AMBIEN) 10 MG tablet, TAKE 1 TABLET BY MOUTH AT BEDTIME AS NEEDED., Disp: 30 tablet, Rfl: 3 .  valACYclovir (VALTREX) 1000 MG tablet, 2 tablets twice a day for 1 day as needed for cold sores (Patient not taking: Reported on 05/29/2018), Disp: 20 tablet, Rfl: 1  Review of Systems  Constitutional: Negative for appetite change, chills, fatigue and fever.  Respiratory: Negative for chest tightness and shortness of breath.   Cardiovascular: Positive for leg swelling. Negative for chest pain and palpitations.  Gastrointestinal: Negative for abdominal pain, nausea and vomiting.  Neurological: Negative for dizziness and weakness.    Social History   Tobacco Use  . Smoking status: Never Smoker  . Smokeless tobacco: Never Used  Substance Use Topics  . Alcohol use: Yes    Alcohol/week: 0.0 - 2.0 standard drinks      Objective:   BP (!) 142/70 (BP Location: Right Arm, Cuff Size: Normal)   Pulse 69   Temp 98 F (36.7 C) (Oral)   Resp 16   Wt 117 lb (53.1 kg)   SpO2 98% Comment: room air  BMI 22.11 kg/m  Vitals:   06/12/18 1536 06/12/18 1538  BP: 140/62 (!) 142/70  Pulse: 69   Resp: 16   Temp: 98 F (36.7 C)   TempSrc: Oral   SpO2: 98%   Weight: 117 lb (53.1 kg)      Physical Exam   General Appearance:    Alert, cooperative, no distress  Eyes:    PERRL, conjunctiva/corneas clear, EOM's intact       Lungs:     Clear to auscultation bilaterally, respirations unlabored  Heart:    Regular rate and rhythm  Neurologic:   Awake, alert, oriented x 3. No apparent focal neurological           defect.          Assessment & Plan    1. Hypertension, unspecified type Improved since restarting  amlodipine and change from furosemide to torsemide by Dr. Candiss Norse. Reviewed medication regiment with patient and stressed importance of taking medications as prescribed.   2. CKD (chronic kidney disease) stage 5, GFR less than 15 ml/min (HCC) Obtained last weeks lab results from Dr. Ledell Noss office showing increased creatinine to 4.31 and eGFR down to 10. Confirmed with patient that she has stopped taking all NSAIDs and made sure she understands detrimental affect of NSAIDs on kidney function.   3. Normocytic anemia From Dr. Ledell Noss labs done last week, hgb has come up from 7.1 to 9.0 since transfusion two weeks ago.   4. Pre-op evaluation Discussed with Dr. Candiss Norse.  She has no known cardiac conditions, but considering deteriorating kidney functions elective surgery should be postponed until kidney functions stabilize and additional renal evaluation can be completed. Patient was advised of this recommendation and importance of keeping follow up appointment with Dr. Candiss Norse on 06-14-2018.      Lelon Huh, MD  [facility]

## 2018-06-12 NOTE — Patient Instructions (Signed)
.   Please review the attached list of medications and notify my office if there are any errors.   . Please bring all of your medications to every appointment so we can make sure that our medication list is the same as yours.   

## 2018-06-13 ENCOUNTER — Telehealth: Payer: Self-pay | Admitting: Family Medicine

## 2018-06-13 ENCOUNTER — Ambulatory Visit
Admission: RE | Admit: 2018-06-13 | Discharge: 2018-06-13 | Disposition: A | Discharge status: Home / Self Care | Payer: Medicare Other | Source: Ambulatory Visit | Attending: Nephrology | Admitting: Nephrology

## 2018-06-13 DIAGNOSIS — N189 Chronic kidney disease, unspecified: Secondary | ICD-10-CM | POA: Diagnosis not present

## 2018-06-13 DIAGNOSIS — I120 Hypertensive chronic kidney disease with stage 5 chronic kidney disease or end stage renal disease: Secondary | ICD-10-CM | POA: Insufficient documentation

## 2018-06-13 DIAGNOSIS — N185 Chronic kidney disease, stage 5: Secondary | ICD-10-CM | POA: Insufficient documentation

## 2018-06-13 NOTE — Telephone Encounter (Signed)
Please let patient know that kidney functions have gotten a little worse. I talked to Dr. Candiss Norse and we both thought she should postpone her surgery until her kidney functions stabilize.  She is supposed to follow up with him on Wednesday the 12th so be sure to keep appointment.

## 2018-06-13 NOTE — Telephone Encounter (Signed)
Patient advised as below. Patient was unaware that she had a follow up appointment tomorrow. Patient was advised to contact Dr. Keturah Barre office first thing tomorrow. Patient verbalized understanding and was in agreement.

## 2018-06-14 DIAGNOSIS — I129 Hypertensive chronic kidney disease with stage 1 through stage 4 chronic kidney disease, or unspecified chronic kidney disease: Secondary | ICD-10-CM | POA: Diagnosis not present

## 2018-06-14 DIAGNOSIS — N179 Acute kidney failure, unspecified: Secondary | ICD-10-CM | POA: Diagnosis not present

## 2018-06-14 DIAGNOSIS — N184 Chronic kidney disease, stage 4 (severe): Secondary | ICD-10-CM | POA: Diagnosis not present

## 2018-06-14 DIAGNOSIS — R809 Proteinuria, unspecified: Secondary | ICD-10-CM | POA: Diagnosis not present

## 2018-06-19 ENCOUNTER — Inpatient Hospital Stay
Admission: AD | Admit: 2018-06-19 | Discharge: 2018-06-22 | DRG: 683 | Disposition: A | Discharge status: Home / Self Care | Payer: Medicare Other | Source: Ambulatory Visit | Attending: Internal Medicine | Admitting: Internal Medicine

## 2018-06-19 ENCOUNTER — Inpatient Hospital Stay: Payer: Medicare Other | Attending: Oncology

## 2018-06-19 ENCOUNTER — Other Ambulatory Visit: Payer: Self-pay

## 2018-06-19 ENCOUNTER — Inpatient Hospital Stay: Payer: Medicare Other

## 2018-06-19 DIAGNOSIS — R809 Proteinuria, unspecified: Secondary | ICD-10-CM

## 2018-06-19 DIAGNOSIS — Z882 Allergy status to sulfonamides status: Secondary | ICD-10-CM

## 2018-06-19 DIAGNOSIS — N184 Chronic kidney disease, stage 4 (severe): Secondary | ICD-10-CM | POA: Diagnosis present

## 2018-06-19 DIAGNOSIS — Z880 Allergy status to penicillin: Secondary | ICD-10-CM | POA: Diagnosis not present

## 2018-06-19 DIAGNOSIS — Z888 Allergy status to other drugs, medicaments and biological substances status: Secondary | ICD-10-CM

## 2018-06-19 DIAGNOSIS — N390 Urinary tract infection, site not specified: Secondary | ICD-10-CM | POA: Diagnosis present

## 2018-06-19 DIAGNOSIS — F419 Anxiety disorder, unspecified: Secondary | ICD-10-CM | POA: Diagnosis present

## 2018-06-19 DIAGNOSIS — R600 Localized edema: Secondary | ICD-10-CM | POA: Diagnosis present

## 2018-06-19 DIAGNOSIS — N179 Acute kidney failure, unspecified: Principal | ICD-10-CM | POA: Diagnosis present

## 2018-06-19 DIAGNOSIS — Z8719 Personal history of other diseases of the digestive system: Secondary | ICD-10-CM

## 2018-06-19 DIAGNOSIS — K219 Gastro-esophageal reflux disease without esophagitis: Secondary | ICD-10-CM | POA: Diagnosis present

## 2018-06-19 DIAGNOSIS — D631 Anemia in chronic kidney disease: Secondary | ICD-10-CM | POA: Diagnosis present

## 2018-06-19 DIAGNOSIS — N049 Nephrotic syndrome with unspecified morphologic changes: Secondary | ICD-10-CM | POA: Diagnosis not present

## 2018-06-19 DIAGNOSIS — N183 Chronic kidney disease, stage 3 (moderate): Secondary | ICD-10-CM | POA: Diagnosis not present

## 2018-06-19 DIAGNOSIS — E875 Hyperkalemia: Secondary | ICD-10-CM | POA: Diagnosis not present

## 2018-06-19 DIAGNOSIS — I129 Hypertensive chronic kidney disease with stage 1 through stage 4 chronic kidney disease, or unspecified chronic kidney disease: Secondary | ICD-10-CM | POA: Diagnosis present

## 2018-06-19 DIAGNOSIS — B9620 Unspecified Escherichia coli [E. coli] as the cause of diseases classified elsewhere: Secondary | ICD-10-CM | POA: Diagnosis present

## 2018-06-19 DIAGNOSIS — I10 Essential (primary) hypertension: Secondary | ICD-10-CM | POA: Diagnosis not present

## 2018-06-19 LAB — CBC WITH DIFFERENTIAL/PLATELET
Abs Immature Granulocytes: 0.02 10*3/uL (ref 0.00–0.07)
Basophils Absolute: 0.1 10*3/uL (ref 0.0–0.1)
Basophils Relative: 1 %
Eosinophils Absolute: 0.3 10*3/uL (ref 0.0–0.5)
Eosinophils Relative: 4 %
HCT: 33.5 % — ABNORMAL LOW (ref 36.0–46.0)
Hemoglobin: 10.3 g/dL — ABNORMAL LOW (ref 12.0–15.0)
Immature Granulocytes: 0 %
Lymphocytes Relative: 8 %
Lymphs Abs: 0.7 10*3/uL (ref 0.7–4.0)
MCH: 28.9 pg (ref 26.0–34.0)
MCHC: 30.7 g/dL (ref 30.0–36.0)
MCV: 93.8 fL (ref 80.0–100.0)
Monocytes Absolute: 0.4 10*3/uL (ref 0.1–1.0)
Monocytes Relative: 5 %
NEUTROS ABS: 7.1 10*3/uL (ref 1.7–7.7)
Neutrophils Relative %: 82 %
Platelets: 362 10*3/uL (ref 150–400)
RBC: 3.57 MIL/uL — ABNORMAL LOW (ref 3.87–5.11)
RDW: 17.7 % — ABNORMAL HIGH (ref 11.5–15.5)
WBC: 8.7 10*3/uL (ref 4.0–10.5)
nRBC: 0 % (ref 0.0–0.2)

## 2018-06-19 LAB — COMPREHENSIVE METABOLIC PANEL
ALT: 14 U/L (ref 0–44)
AST: 18 U/L (ref 15–41)
Albumin: 4.1 g/dL (ref 3.5–5.0)
Alkaline Phosphatase: 197 U/L — ABNORMAL HIGH (ref 38–126)
Anion gap: 10 (ref 5–15)
BUN: 74 mg/dL — ABNORMAL HIGH (ref 8–23)
CHLORIDE: 111 mmol/L (ref 98–111)
CO2: 15 mmol/L — ABNORMAL LOW (ref 22–32)
Calcium: 9 mg/dL (ref 8.9–10.3)
Creatinine, Ser: 4.34 mg/dL — ABNORMAL HIGH (ref 0.44–1.00)
GFR calc Af Amer: 11 mL/min — ABNORMAL LOW (ref >60)
GFR calc non Af Amer: 10 mL/min — ABNORMAL LOW (ref >60)
Glucose, Bld: 105 mg/dL — ABNORMAL HIGH (ref 70–99)
Potassium: 5.1 mmol/L (ref 3.5–5.1)
SODIUM: 136 mmol/L (ref 135–145)
Total Bilirubin: 0.5 mg/dL (ref 0.3–1.2)
Total Protein: 6.8 g/dL (ref 6.5–8.1)

## 2018-06-19 LAB — PROTEIN / CREATININE RATIO, URINE
Creatinine, Urine: 36 mg/dL
Protein Creatinine Ratio: 6.67 mg/mg{Cre} — ABNORMAL HIGH (ref 0.00–0.15)
Total Protein, Urine: 240 mg/dL

## 2018-06-19 LAB — URINALYSIS, ROUTINE W REFLEX MICROSCOPIC
Bilirubin Urine: NEGATIVE
Glucose, UA: NEGATIVE mg/dL
KETONES UR: NEGATIVE mg/dL
Nitrite: NEGATIVE
Protein, ur: 100 mg/dL — AB
Specific Gravity, Urine: 1.011 (ref 1.005–1.030)
WBC, UA: >50 WBC/hpf — ABNORMAL HIGH (ref 0–5)
pH: 5 (ref 5.0–8.0)

## 2018-06-19 LAB — PROTIME-INR
INR: 0.99
Prothrombin Time: 13 seconds (ref 11.4–15.2)

## 2018-06-19 LAB — TYPE AND SCREEN
ABO/RH(D): O POS
Antibody Screen: NEGATIVE

## 2018-06-19 LAB — APTT: aPTT: 30 seconds (ref 24–36)

## 2018-06-19 MED ORDER — FAMOTIDINE 20 MG PO TABS
20.0000 mg | ORAL_TABLET | Freq: Every day | ORAL | Status: DC
  Administered 2018-06-19 – 2018-06-22 (×4): 20 mg via ORAL
  Filled 2018-06-19 (×5): qty 1

## 2018-06-19 MED ORDER — CARVEDILOL 25 MG PO TABS
25.0000 mg | ORAL_TABLET | Freq: Two times a day (BID) | ORAL | Status: DC
  Administered 2018-06-19 – 2018-06-22 (×6): 25 mg via ORAL
  Filled 2018-06-19 (×6): qty 1

## 2018-06-19 MED ORDER — AMLODIPINE BESYLATE 10 MG PO TABS
10.0000 mg | ORAL_TABLET | Freq: Every day | ORAL | Status: DC
  Administered 2018-06-19 – 2018-06-22 (×4): 10 mg via ORAL
  Filled 2018-06-19 (×4): qty 1

## 2018-06-19 MED ORDER — FLUTICASONE PROPIONATE 50 MCG/ACT NA SUSP
2.0000 | Freq: Every day | NASAL | Status: DC
  Administered 2018-06-19 – 2018-06-21 (×3): 2 via NASAL
  Filled 2018-06-19: qty 16

## 2018-06-19 MED ORDER — SODIUM CHLORIDE 0.9 % IV SOLN
INTRAVENOUS | Status: DC
  Administered 2018-06-20: 07:00:00 via INTRAVENOUS

## 2018-06-19 MED ORDER — LORAZEPAM 0.5 MG PO TABS
0.5000 mg | ORAL_TABLET | Freq: Once | ORAL | Status: AC
  Administered 2018-06-20: 0.5 mg via ORAL
  Filled 2018-06-19: qty 1

## 2018-06-19 MED ORDER — SODIUM CHLORIDE 0.9% FLUSH: 3.0000 mL | INTRAVENOUS | Status: DC | PRN

## 2018-06-19 MED ORDER — ACETAMINOPHEN 325 MG PO TABS
650.0000 mg | ORAL_TABLET | Freq: Four times a day (QID) | ORAL | Status: DC | PRN
  Administered 2018-06-19 – 2018-06-20 (×2): 650 mg via ORAL
  Filled 2018-06-19 (×3): qty 2

## 2018-06-19 MED ORDER — BUTALBITAL-APAP-CAFFEINE 50-325-40 MG PO TABS
1.0000 | ORAL_TABLET | ORAL | Status: DC | PRN
  Administered 2018-06-20: 08:00:00 1 via ORAL
  Filled 2018-06-19 (×2): qty 2

## 2018-06-19 MED ORDER — BUPROPION HCL 75 MG PO TABS
150.0000 mg | ORAL_TABLET | Freq: Every day | ORAL | Status: DC
  Administered 2018-06-19 – 2018-06-22 (×4): 150 mg via ORAL
  Filled 2018-06-19 (×4): qty 2

## 2018-06-19 MED ORDER — SODIUM CHLORIDE 0.9% FLUSH
3.0000 mL | Freq: Two times a day (BID) | INTRAVENOUS | Status: DC
  Administered 2018-06-19 – 2018-06-22 (×8): 3 mL via INTRAVENOUS

## 2018-06-19 MED ORDER — POLYETHYLENE GLYCOL 3350 17 G PO PACK: 17.0000 g | PACK | Freq: Every day | ORAL | Status: DC | PRN

## 2018-06-19 MED ORDER — ACETAMINOPHEN 650 MG RE SUPP: 650.0000 mg | Freq: Four times a day (QID) | RECTAL | Status: DC | PRN

## 2018-06-19 MED ORDER — HYDROCODONE-ACETAMINOPHEN 5-325 MG PO TABS
1.0000 | ORAL_TABLET | ORAL | Status: DC | PRN
  Administered 2018-06-22: 09:00:00 1 via ORAL
  Filled 2018-06-19: qty 1

## 2018-06-19 MED ORDER — BUTALBITAL-APAP-CAFFEINE 50-325-40 MG PO TABS
1.0000 | ORAL_TABLET | Freq: Four times a day (QID) | ORAL | Status: DC | PRN
  Administered 2018-06-19 – 2018-06-22 (×6): 1 via ORAL
  Filled 2018-06-19 (×10): qty 1

## 2018-06-19 MED ORDER — ONDANSETRON HCL 4 MG PO TABS: 4.0000 mg | ORAL_TABLET | Freq: Four times a day (QID) | ORAL | Status: DC | PRN

## 2018-06-19 MED ORDER — HYDRALAZINE HCL 50 MG PO TABS
100.0000 mg | ORAL_TABLET | Freq: Three times a day (TID) | ORAL | Status: DC
  Administered 2018-06-19 – 2018-06-20 (×3): 100 mg via ORAL
  Administered 2018-06-20: 18:00:00 50 mg via ORAL
  Administered 2018-06-20 – 2018-06-21 (×4): 100 mg via ORAL
  Filled 2018-06-19 (×8): qty 2

## 2018-06-19 MED ORDER — ALBUTEROL SULFATE (2.5 MG/3ML) 0.083% IN NEBU: 2.5000 mg | INHALATION_SOLUTION | RESPIRATORY_TRACT | Status: DC | PRN

## 2018-06-19 MED ORDER — LORAZEPAM 1 MG PO TABS: 1.0000 mg | ORAL_TABLET | Freq: Two times a day (BID) | ORAL | Status: DC | PRN

## 2018-06-19 MED ORDER — TORSEMIDE 20 MG PO TABS
40.0000 mg | ORAL_TABLET | Freq: Every morning | ORAL | Status: DC
  Administered 2018-06-19 – 2018-06-22 (×4): 40 mg via ORAL
  Filled 2018-06-19 (×4): qty 2

## 2018-06-19 MED ORDER — ONDANSETRON HCL 4 MG/2ML IJ SOLN
4.0000 mg | Freq: Four times a day (QID) | INTRAMUSCULAR | Status: DC | PRN
  Administered 2018-06-21: 4 mg via INTRAVENOUS
  Filled 2018-06-19: qty 2

## 2018-06-19 MED ORDER — HYDRALAZINE HCL 20 MG/ML IJ SOLN
15.0000 mg | INTRAMUSCULAR | Status: DC | PRN
  Administered 2018-06-19 – 2018-06-21 (×7): 15 mg via INTRAVENOUS
  Filled 2018-06-19 (×6): qty 1

## 2018-06-19 MED ORDER — ZOLPIDEM TARTRATE 5 MG PO TABS
5.0000 mg | ORAL_TABLET | Freq: Every evening | ORAL | Status: DC | PRN
  Administered 2018-06-19 – 2018-06-21 (×3): 5 mg via ORAL
  Filled 2018-06-19 (×3): qty 1

## 2018-06-19 MED ORDER — ALBUTEROL SULFATE (2.5 MG/3ML) 0.083% IN NEBU: 2.5000 mg | INHALATION_SOLUTION | Freq: Four times a day (QID) | RESPIRATORY_TRACT | Status: DC

## 2018-06-19 MED ORDER — LISINOPRIL 10 MG PO TABS
10.0000 mg | ORAL_TABLET | Freq: Every day | ORAL | Status: DC
  Administered 2018-06-19: 10 mg via ORAL
  Filled 2018-06-19: qty 1

## 2018-06-19 MED ORDER — SODIUM CHLORIDE 0.9 % IV SOLN: 250.0000 mL | INTRAVENOUS | Status: DC | PRN

## 2018-06-19 MED ORDER — CLONIDINE HCL 0.1 MG PO TABS
0.3000 mg | ORAL_TABLET | Freq: Three times a day (TID) | ORAL | Status: DC
  Administered 2018-06-19 – 2018-06-22 (×10): 0.3 mg via ORAL
  Filled 2018-06-19 (×10): qty 3

## 2018-06-19 NOTE — Progress Notes (Signed)
[facility], [place] 06/19/18  Subjective:   Patient is known to our practice from outpatient.  She is admitted for evaluation of acute on chronic kidney disease stage IV in the setting of nephrotic syndrome.  Patient has had progressive worsening of creatinine over the past many months.  In May, creatinine limits 2.78 which has progressed to 4.3 more recently.  She also had significant lower extremity edema and poorly controlled blood pressure.  He is brought in for evaluation of acute renal failure, blood pressure control in possible kidney biopsy tomorrow  Objective:  Vital signs in last 24 hours:     Weight change:  Filed Weights   06/19/18 1325  Weight: 53.1 kg    Intake/Output:   No intake or output data in the 24 hours ending 06/19/18 1547   Physical Exam: General:  No acute distress, sitting up in the chair  HEENT  anicteric, moist oral mucous membranes  Neck  supple  Pulm/lungs  clear to auscultation bilaterally  CVS/Heart  regular rhythm  Abdomen:   Soft, nontender, nondistended  Extremities:  2+ pitting edema bilaterally  Neurologic:  Alert, oriented  Skin:  No acute rashes    Basic Metabolic Panel:  No results for input(s): NA, K, CL, CO2, GLUCOSE, BUN, CREATININE, CALCIUM, MG, PHOS in the last 168 hours.   CBC: No results for input(s): WBC, NEUTROABS, HGB, HCT, MCV, PLT in the last 168 hours.   No results found for: HEPBSAG, HEPBSAB, HEPBIGM    Microbiology:  No results found for this or any previous visit (from the past 240 hour(s)).  Coagulation Studies: No results for input(s): LABPROT, INR in the last 72 hours.  Urinalysis: No results for input(s): COLORURINE, LABSPEC, PHURINE, GLUCOSEU, HGBUR, BILIRUBINUR, KETONESUR, PROTEINUR, UROBILINOGEN, NITRITE, LEUKOCYTESUR in the last 72 hours.  Invalid input(s): APPERANCEUR    Imaging: No results found.   Medications:       Assessment/ Plan:  68 y.o. Caucasian  female with hypertension, chronic kidney disease, history of nonsteroidal use, anemia, nephrotic proteinuria is admitted for possible kidney biopsy  Acute kidney injury on chronic kidney disease stage IV, nephrotic proteinuria -Creatinine has worsened from 3.50 from January 15 th to 4.31 on February 6 -She has nephrotic range proteinuria with protein creatinine ratio of 5.6 g Previous work-up has included negative SPEP, UPEP, normal kappa to lambda light chain ratio, normal complements, negative PLA 2R antibody, negative ANA, normal ESR -Plan for renal biopsy tomorrow morning provided blood pressure remains controlled  Hypertension with CKD and lower extremity edema -Continue outpatient regimen of amlodipine, carvedilol, clonidine, hydralazine, quinapril Volume control with torsemide      LOS: 0 Milagro Belmares 2/17/202012:30 PM  [facility] [place], [place]  Note: This note was prepared with Dragon dictation. Any transcription errors are unintentional

## 2018-06-19 NOTE — H&P (Signed)
[facility] [place] NAME: Amy [name]    MR#:  [id number]  DATE OF BIRTH:  Mar 07, 1951  DATE OF ADMISSION:  06/19/2018  PRIMARY CARE PHYSICIAN: [name], MD   REQUESTING/REFERRING PHYSICIAN:   CHIEF COMPLAINT:  No chief complaint on file.   HISTORY OF PRESENT ILLNESS: Amy [name]  is a 68 y.o. female with a known history per below accepted as a direct admission from nephrology/Dr. Candiss Norse for worsening acute renal failure and nephrotic syndrome for which a cause has not been found, plans are for renal biopsy on tomorrow per nephrology, patient is now be admitted for acute kidney injury with chronic kidney disease stage IV.  PAST MEDICAL HISTORY:   Past Medical History:  Diagnosis Date  . Anemia    a. 08/2016 Guaiac + stool. EGD w/ gastritis.  . Anxiety   . CKD (chronic kidney disease), stage III ([place])   . Colon polyp    a. 08/2016 Colonoscopy.  . Gastritis    a. 08/2016 EGD: Gastritis-->PPI.  [name] GERD (gastroesophageal reflux disease)   . History of chicken pox   . History of measles as a child   . Hypertension   . Hyponatremia    a. 08/2016 in setting of HCTZ Rx.  . Multiple thyroid nodules     PAST SURGICAL HISTORY:  Past Surgical History:  Procedure Laterality Date  . ABDOMINAL HYSTERECTOMY  2003   due to heavy periods and clotting during menses  . BREAST LUMPECTOMY Left 1990  . CHOLECYSTECTOMY  2010  . COLONOSCOPY WITH PROPOFOL N/A 09/07/2016   Procedure: COLONOSCOPY WITH PROPOFOL;  Surgeon: Lucilla Lame, MD;  Location: [facility] ENDOSCOPY;  Service: Endoscopy;  Laterality: N/A;  . DILATION AND CURETTAGE OF UTERUS  1985  . ESOPHAGOGASTRODUODENOSCOPY (EGD) WITH PROPOFOL N/A 09/07/2016   Procedure: ESOPHAGOGASTRODUODENOSCOPY (EGD) WITH PROPOFOL;  Surgeon: Lucilla Lame, MD;  Location: [facility] ENDOSCOPY;  Service: Endoscopy;  Laterality: N/A;  . GALLBLADDER SURGERY  2012  . KNEE SURGERY     Dr. Tamala Julian  . MRI, Lumbar spine  07/23/2009    Multilevel annular bulge and disc protrusion at L2-L3, L3-L4, L4-L5, and L5-S1  . TONSILLECTOMY    . TONSILLECTOMY AND ADENOIDECTOMY  1970  . UPPER GI ENDOSCOPY  03/02/2006   H. Pylori negative; Dr. Allen Norris    SOCIAL HISTORY:  Social History   Tobacco Use  . Smoking status: Never Smoker  . Smokeless tobacco: Never Used  Substance Use Topics  . Alcohol use: Yes    Alcohol/week: 0.0 - 2.0 standard drinks    FAMILY HISTORY:  Family History  Problem Relation Age of Onset  . Pancreatic cancer Father   . Congestive Heart Failure Maternal Grandmother   . Cancer Paternal Grandfather   . Heart disease Maternal Uncle     DRUG ALLERGIES:  Allergies  Allergen Reactions  . Cephalosporins   . Clarithromycin   . Lunesta  [Eszopiclone]     Heart racing  . Penicillin V     Has patient had a PCN reaction causing immediate rash, facial/tongue/throat swelling, SOB or lightheadedness with hypotension: Yes Has patient had a PCN reaction causing severe rash involving mucus membranes or skin necrosis: No Has patient had a PCN reaction that required hospitalization No Has patient had a PCN reaction occurring within the last 10 years: No If all of the above answers are "NO", then may proceed with Cephalosporin use.  . Sulfa Antibiotics     REVIEW OF  SYSTEMS:   CONSTITUTIONAL: No fever, fatigue or weakness.  EYES: No blurred or double vision.  EARS, NOSE, AND THROAT: No tinnitus or ear pain.  RESPIRATORY: No cough, shortness of breath, wheezing or hemoptysis.  CARDIOVASCULAR: No chest pain, orthopnea, edema.  GASTROINTESTINAL: No nausea, vomiting, diarrhea or abdominal pain.  GENITOURINARY: No dysuria, hematuria.  ENDOCRINE: No polyuria, nocturia,  HEMATOLOGY: No anemia, easy bruising or bleeding SKIN: No rash or lesion. MUSCULOSKELETAL: No joint pain or arthritis.   NEUROLOGIC: No tingling, numbness, weakness.  PSYCHIATRY: No anxiety or depression.   MEDICATIONS AT HOME:  Prior to  Admission medications   Medication Sig Start Date End Date Taking? Authorizing Provider  amLODipine (NORVASC) 5 MG tablet TAKE 1 TABLET(5 MG) BY MOUTH DAILY 05/30/18   [name], MD  aspirin EC 81 MG EC tablet Take 1 tablet (81 mg total) by mouth daily. 09/27/16   Dustin Flock, MD  buPROPion (WELLBUTRIN) 75 MG tablet Take 1 tablet (75 mg total) by mouth 2 (two) times daily. Patient taking differently: Take 150 mg by mouth daily.  07/25/17   [name], MD  butalbital-acetaminophen-caffeine (FIORICET, ESGIC) [phone] MG tablet Take 1-2 tablets by mouth every 4 (four) hours as needed for headache. 05/17/18   [name], MD  carvedilol (COREG) 25 MG tablet TAKE 1 TABLET(25 MG) BY MOUTH TWICE DAILY WITH A MEAL 06/10/18   [name], MD  cloNIDine (CATAPRES) 0.3 MG tablet Take 1 tablet (0.3 mg total) by mouth every 8 (eight) hours. 05/30/18   [name], MD  fluticasone (FLONASE) 50 MCG/ACT nasal spray Place 2 sprays into both nostrils daily. 05/17/18   [name], MD  hydrALAZINE (APRESOLINE) 50 MG tablet Take 1 tablet (50 mg total) by mouth 3 (three) times daily. if your Systolic QH>476 or Diastolic LY>650 35/46/56   Gollan, Kathlene November, MD  LORazepam (ATIVAN) 1 MG tablet Take 1 tablet (1 mg total) by mouth 2 (two) times daily as needed. 04/04/18   [name], MD  quinapril (ACCUPRIL) 5 MG tablet Take 10 mg by mouth daily.     [provider]  ranitidine (ZANTAC) 150 MG tablet Take 150 mg by mouth 2 (two) times daily.    [provider]  torsemide (DEMADEX) 20 MG tablet Take 40 mg by mouth every morning.    [provider]  zolpidem (AMBIEN) 10 MG tablet TAKE 1 TABLET BY MOUTH AT BEDTIME AS NEEDED. 05/09/18   [name], MD      PHYSICAL EXAMINATION:   VITAL SIGNS: Blood pressure (!) 176/86, pulse 72, temperature (!) 97.4 F (36.3 C), temperature source Oral, resp. rate 18, SpO2 100 %.  GENERAL:  68 y.o.-year-old patient lying in  the bed with no acute distress.  EYES: Pupils equal, round, reactive to light and accommodation. No scleral icterus. Extraocular muscles intact.  HEENT: Head atraumatic, normocephalic. Oropharynx and nasopharynx clear.  NECK:  Supple, no jugular venous distention. No thyroid enlargement, no tenderness.  LUNGS: Normal breath sounds bilaterally, no wheezing, rales,rhonchi or crepitation. No use of accessory muscles of respiration.  CARDIOVASCULAR: S1, S2 normal. No murmurs, rubs, or gallops.  ABDOMEN: Soft, nontender, nondistended. Bowel sounds present. No organomegaly or mass.  EXTREMITIES: No pedal edema, cyanosis, or clubbing.  NEUROLOGIC: Cranial nerves II through XII are intact. Muscle strength 5/5 in all extremities. Sensation intact. Gait not checked.  PSYCHIATRIC: The patient is alert and oriented x 3.  SKIN: No obvious rash, lesion, or  ulcer.   LABORATORY PANEL:   CBC No results for input(s): WBC, HGB, HCT, PLT, MCV, MCH, MCHC, RDW, LYMPHSABS, MONOABS, EOSABS, BASOSABS, BANDABS in the last 168 hours.  Invalid input(s): NEUTRABS, BANDSABD ------------------------------------------------------------------------------------------------------------------  Chemistries  No results for input(s): NA, K, CL, CO2, GLUCOSE, BUN, CREATININE, CALCIUM, MG, AST, ALT, ALKPHOS, BILITOT in the last 168 hours.  Invalid input(s): GFRCGP ------------------------------------------------------------------------------------------------------------------ CrCl cannot be calculated (Patient's most recent lab result is older than the maximum 21 days allowed.). ------------------------------------------------------------------------------------------------------------------ No results for input(s): TSH, T4TOTAL, T3FREE, THYROIDAB in the last 72 hours.  Invalid input(s): FREET3   Coagulation profile No results for input(s): INR, PROTIME in the last 168  hours. ------------------------------------------------------------------------------------------------------------------- No results for input(s): DDIMER in the last 72 hours. -------------------------------------------------------------------------------------------------------------------  Cardiac Enzymes No results for input(s): CKMB, TROPONINI, MYOGLOBIN in the last 168 hours.  Invalid input(s): CK ------------------------------------------------------------------------------------------------------------------ Invalid input(s): POCBNP  ---------------------------------------------------------------------------------------------------------------  Urinalysis    Component Value Date/Time   COLORURINE YELLOW (A) 09/25/2016 1128   APPEARANCEUR HAZY (A) 09/25/2016 1128   LABSPEC 1.020 09/25/2016 1128   PHURINE 5.0 09/25/2016 1128   GLUCOSEU NEGATIVE 09/25/2016 1128   HGBUR NEGATIVE 09/25/2016 1128   BILIRUBINUR Neg 12/21/2017 1435   KETONESUR NEGATIVE 09/25/2016 1128   PROTEINUR Positive (A) 12/21/2017 1435   PROTEINUR 100 (A) 09/25/2016 1128   UROBILINOGEN 0.2 12/21/2017 1435   NITRITE Neg 12/21/2017 1435   NITRITE NEGATIVE 09/25/2016 1128   LEUKOCYTESUR Negative 12/21/2017 1435     RADIOLOGY: No results found.  EKG: Orders placed or performed in visit on 04/14/18  . EKG 12-Lead    IMPRESSION AND PLAN: *Acute kidney injury with chronic kidney disease stage IV Concern for nephrotic syndrome Admit to regular nursing for bed, nephrology consulted with plans for renal biopsy on tomorrow provided blood pressure is controlled, n.p.o. after midnight, avoid nephrotoxic agents, check CMP, INR, hold aspirin/anticoagulants, avoid NSAID medications, strict I&O monitoring, daily weights and continue close medical monitoring  *Acute accelerated hypertension Continue home regiment, will increase Norvasc to 10 mg daily, hydralazine 200 mg 3 times daily, IV hydralazine as needed  systolic blood pressure greater than 160, vitals per routine, make changes as per necessary  *Chronic GERD Stable PPI daily  *Chronic GAD Stable Continue home regiment  *Chronic anemia of chronic disease Hemoglobin relatively stable Conservative medical management  All the records are reviewed and case discussed with ED provider. Management plans discussed with the patient, family and they are in agreement.  CODE STATUS:full Code Status History    Date Active Date Inactive Code Status Order ID Comments User Context   09/24/2016 0049 09/27/2016 1657 Full Code [id number]  Harvie Bridge, DO Inpatient   09/06/2016 0030 09/09/2016 1624 Full Code [id number]  Lance Coon, MD Inpatient       TOTAL TIME TAKING CARE OF THIS PATIENT: 45 minutes.    Amy [name] M.D on 06/19/2018   Between 7am to 6pm - Pager - [phone]  After 6pm go to [email or web address] - password [email or web address]  Office  [phone]  CC: Primary care physician; [name], MD   Note: This dictation was prepared with Dragon dictation along with smaller phrase technology. Any transcriptional errors that result from this process are unintentional.

## 2018-06-19 NOTE — Progress Notes (Signed)

## 2018-06-20 ENCOUNTER — Inpatient Hospital Stay: Payer: Medicare Other

## 2018-06-20 LAB — BASIC METABOLIC PANEL
Anion gap: 7 (ref 5–15)
BUN: 80 mg/dL — ABNORMAL HIGH (ref 8–23)
CO2: 13 mmol/L — ABNORMAL LOW (ref 22–32)
Calcium: 8.1 mg/dL — ABNORMAL LOW (ref 8.9–10.3)
Chloride: 115 mmol/L — ABNORMAL HIGH (ref 98–111)
Creatinine, Ser: 4.73 mg/dL — ABNORMAL HIGH (ref 0.44–1.00)
GFR calc Af Amer: 10 mL/min — ABNORMAL LOW (ref >60)
GFR calc non Af Amer: 9 mL/min — ABNORMAL LOW (ref >60)
GLUCOSE: 103 mg/dL — AB (ref 70–99)
POTASSIUM: 5.6 mmol/L — AB (ref 3.5–5.1)
Sodium: 135 mmol/L (ref 135–145)

## 2018-06-20 LAB — HIV ANTIBODY (ROUTINE TESTING W REFLEX): HIV SCREEN 4TH GENERATION: NONREACTIVE

## 2018-06-20 MED ORDER — LISINOPRIL 10 MG PO TABS
10.0000 mg | ORAL_TABLET | Freq: Every day | ORAL | Status: DC
  Administered 2018-06-21: 10:00:00 10 mg via ORAL
  Filled 2018-06-20: qty 1

## 2018-06-20 MED ORDER — LEVOFLOXACIN 250 MG PO TABS
250.0000 mg | ORAL_TABLET | Freq: Every day | ORAL | Status: DC
  Administered 2018-06-20 – 2018-06-22 (×3): 250 mg via ORAL
  Filled 2018-06-20 (×3): qty 1

## 2018-06-20 MED ORDER — SODIUM ZIRCONIUM CYCLOSILICATE 10 G PO PACK
10.0000 g | PACK | Freq: Every day | ORAL | Status: DC
  Administered 2018-06-20 – 2018-06-22 (×3): 10 g via ORAL
  Filled 2018-06-20 (×3): qty 1

## 2018-06-20 NOTE — Progress Notes (Signed)
[facility], [place] 06/20/18  Subjective:   Patient is known to our practice from outpatient.  She is admitted for evaluation of acute on chronic kidney disease stage IV in the setting of nephrotic syndrome.  Patient has had progressive worsening of creatinine over the past many months.  In May 2019, creatinine limits 2.78 which has progressed to 4.3 more recently.  She also had significant lower extremity edema and poorly controlled blood pressure.   She c/o dysuria. U/A suggestive of UTI  Objective:  Vital signs in last 24 hours:  Temp:  [97.4 F (36.3 C)-98.9 F (37.2 C)] 98.1 F (36.7 C) (02/18 0528) Pulse Rate:  [72-88] 82 (02/18 0725) Resp:  [14-19] 19 (02/18 0528) BP: (121-185)/(58-86) 159/67 (02/18 0725) SpO2:  [97 %-100 %] 97 % (02/18 0528) Weight:  [53.1 kg] 53.1 kg (02/17 1325)  Weight change:  Filed Weights   06/19/18 1325  Weight: 53.1 kg    Intake/Output:    Intake/Output Summary (Last 24 hours) at 06/20/2018 0910 Last data filed at 06/19/2018 1851 Gross per 24 hour  Intake 120 ml  Output -  Net 120 ml     Physical Exam: General:  No acute distress, sitting up in bed  HEENT  anicteric, moist oral mucous membranes  Neck  supple  Pulm/lungs  clear to auscultation bilaterally  CVS/Heart  regular rhythm, 2/6 murmur  Abdomen:   Soft, nontender, nondistended  Extremities:  1-2+ pitting edema bilaterally  Neurologic:  Alert, oriented  Skin:  No acute rashes    Basic Metabolic Panel:  Recent Labs  Lab 06/19/18 1317 06/20/18 0316  NA 136 135  K 5.1 5.6*  CL 111 115*  CO2 15* 13*  GLUCOSE 105* 103*  BUN 74* 80*  CREATININE 4.34* 4.73*  CALCIUM 9.0 8.1*     CBC: Recent Labs  Lab 06/19/18 1317  WBC 8.7  NEUTROABS 7.1  HGB 10.3*  HCT 33.5*  MCV 93.8  PLT 362     No results found for: HEPBSAG, HEPBSAB, HEPBIGM    Microbiology:  No results found for this or any previous visit (from the past 240  hour(s)).  Coagulation Studies: Recent Labs    06/19/18 1317  LABPROT 13.0  INR 0.99    Urinalysis: Recent Labs    06/19/18 1213  COLORURINE STRAW*  LABSPEC 1.011  PHURINE 5.0  GLUCOSEU NEGATIVE  HGBUR SMALL*  BILIRUBINUR NEGATIVE  KETONESUR NEGATIVE  PROTEINUR 100*  NITRITE NEGATIVE  LEUKOCYTESUR SMALL*      Imaging: No results found.   Medications:   . amLODipine  10 mg Oral Daily  . buPROPion  150 mg Oral Daily  . carvedilol  25 mg Oral BID WC  . cloNIDine  0.3 mg Oral Q8H  . famotidine  20 mg Oral Daily  . fluticasone  2 spray Each Nare Daily  . hydrALAZINE  100 mg Oral TID  . levofloxacin  250 mg Oral Daily  . sodium chloride flush  3 mL Intravenous Q12H  . sodium zirconium cyclosilicate  10 g Oral Daily  . torsemide  40 mg Oral q morning - 10a      Assessment/ Plan:  68 y.o. Caucasian female with hypertension, chronic kidney disease, history of nonsteroidal use, anemia, nephrotic proteinuria is admitted for possible kidney biopsy  Acute kidney injury on chronic kidney disease stage IV, nephrotic proteinuria -Creatinine has worsened from 3.50 from January 15 th to 4.31 on February 6 -She has nephrotic range proteinuria with  protein creatinine ratio of 5.6 g Previous work-up has included negative SPEP, UPEP, normal kappa to lambda light chain ratio, normal complements, negative PLA 2R antibody, negative ANA, normal ESR -Defer renal biopsy to wed/thursday since patient has UTI - Plan for renal biopsy tomorrow morning provided blood pressure remains controlled  Hypertension with CKD and lower extremity edema -Continue outpatient regimen of amlodipine, carvedilol, clonidine, hydralazine, quinapril Volume control with torsemide  Hyperkalemia - Low K Diet - Lokelma  UTI -started levaquin      LOS: [place] 2/18/20209:10 AM  [place], [place]  Note: This note was prepared with Dragon  dictation. Any transcription errors are unintentional

## 2018-06-20 NOTE — Progress Notes (Signed)
[facility] at [place] NAME: Amy [name]    MR#:  [id number]  DATE OF BIRTH:  08/29/1950  SUBJECTIVE:   Patient states she is feeling fine this morning.  She endorses dysuria and urinary frequency.  She ss also having some suprapubic "pressure".  She denies fevers or chills.  No other concerns.  REVIEW OF SYSTEMS:  Review of Systems  Constitutional: Negative for chills and fever.  HENT: Negative for congestion and sore throat.   Eyes: Negative for blurred vision and double vision.  Respiratory: Negative for cough and shortness of breath.   Cardiovascular: Negative for chest pain and palpitations.  Gastrointestinal: Positive for abdominal pain. Negative for nausea and vomiting.  Genitourinary: Positive for dysuria and urgency.  Musculoskeletal: Negative for back pain and neck pain.  Neurological: Negative for dizziness and headaches.  Psychiatric/Behavioral: Negative for depression. The patient is not nervous/anxious.     DRUG ALLERGIES:   Allergies  Allergen Reactions  . Cephalosporins   . Clarithromycin   . Lunesta  [Eszopiclone]     Heart racing  . Penicillin V     Has patient had a PCN reaction causing immediate rash, facial/tongue/throat swelling, SOB or lightheadedness with hypotension: Yes Has patient had a PCN reaction causing severe rash involving mucus membranes or skin necrosis: No Has patient had a PCN reaction that required hospitalization No Has patient had a PCN reaction occurring within the last 10 years: No If all of the above answers are "NO", then may proceed with Cephalosporin use.  . Sulfa Antibiotics    VITALS:  Blood pressure (!) 156/70, pulse 80, temperature 98.2 F (36.8 C), resp. rate 18, height 5\' 4"  (1.626 m), weight 53.1 kg, SpO2 98 %. PHYSICAL EXAMINATION:  Physical Exam  GENERAL:  67 y.o.-year-old patient lying in the bed with no acute distress.  EYES: Pupils equal, round, reactive to light and  accommodation. No scleral icterus. Extraocular muscles intact.  HEENT: Head atraumatic, normocephalic. Oropharynx and nasopharynx clear.  NECK:  Supple, no jugular venous distention. No thyroid enlargement, no tenderness.  LUNGS: Normal breath sounds bilaterally, no wheezing, rales,rhonchi or crepitation. No use of accessory muscles of respiration.  CARDIOVASCULAR: RRR, S1, S2 normal. No murmurs, rubs, or gallops.  ABDOMEN: Soft, nondistended. Bowel sounds present. No organomegaly or mass. + Mild suprapubic tenderness to palpation EXTREMITIES: No pedal edema, cyanosis, or clubbing.  NEUROLOGIC: Cranial nerves II through XII are intact. Muscle strength 5/5 in all extremities. Sensation intact. Gait not checked.  PSYCHIATRIC: The patient is alert and oriented x 3.  SKIN: No obvious rash, lesion, or ulcer.  LABORATORY PANEL:  Female CBC Recent Labs  Lab 06/19/18 1317  WBC 8.7  HGB 10.3*  HCT 33.5*  PLT 362   ------------------------------------------------------------------------------------------------------------------ Chemistries  Recent Labs  Lab 06/19/18 1317 06/20/18 0316  NA 136 135  K 5.1 5.6*  CL 111 115*  CO2 15* 13*  GLUCOSE 105* 103*  BUN 74* 80*  CREATININE 4.34* 4.73*  CALCIUM 9.0 8.1*  AST 18  --   ALT 14  --   ALKPHOS 197*  --   BILITOT 0.5  --    RADIOLOGY:  No results found. ASSESSMENT AND PLAN:   Acute kidney injury in CKD IV- concern for nephrotic syndrome -Nephrology following-plan for renal biopsy wed or thurs once UTI is treated -Avoid nephrotoxic agents  UTI- patient with dysuria and urinary frequency.  UA suggestive of infection. -Levaquin 250 mg daily x3 days -  Follow-up urine culture  Hyperkalemia- due to AKI -Continue lokelma  Uncontrolled hypertension- BPs remain elevated this morning, but improved from previous. -Continue Norvasc, Coreg, clonidine, hydralazine, lisinopril -IV hydralazine PRN  Chronic GERD- stable -PPI  daily  Chronic GAD- stable -Continue Wellbutrin  Anemia of chronic kidney disease- hemoglobin higher than baseline -Monitor  All the records are reviewed and case discussed with Care Management/Social Worker. Management plans discussed with the patient, family and they are in agreement.  CODE STATUS: Full Code  TOTAL TIME TAKING CARE OF THIS PATIENT: 40 minutes.   More than 50% of the time was spent in counseling/coordination of care: YES  POSSIBLE D/C IN 2-3 DAYS, DEPENDING ON CLINICAL CONDITION.   Berna Spare Clancy Leiner M.D on 06/20/2018 at 11:54 AM  Between 7am to 6pm - Pager - [phone]  After 6pm go to [email or web address] - Proofreader  [facility]  Office  [phone]  CC: Primary care physician; [name], MD  Note: This dictation was prepared with Dragon dictation along with smaller phrase technology. Any transcriptional errors that result from this process are unintentional.

## 2018-06-21 LAB — RENAL FUNCTION PANEL
Albumin: 2.9 g/dL — ABNORMAL LOW (ref 3.5–5.0)
Anion gap: 9 (ref 5–15)
BUN: 72 mg/dL — ABNORMAL HIGH (ref 8–23)
CO2: 13 mmol/L — ABNORMAL LOW (ref 22–32)
Calcium: 8.5 mg/dL — ABNORMAL LOW (ref 8.9–10.3)
Chloride: 114 mmol/L — ABNORMAL HIGH (ref 98–111)
Creatinine, Ser: 4.52 mg/dL — ABNORMAL HIGH (ref 0.44–1.00)
GFR calc Af Amer: 11 mL/min — ABNORMAL LOW (ref >60)
GFR calc non Af Amer: 9 mL/min — ABNORMAL LOW (ref >60)
Glucose, Bld: 105 mg/dL — ABNORMAL HIGH (ref 70–99)
Phosphorus: 5.3 mg/dL — ABNORMAL HIGH (ref 2.5–4.6)
Potassium: 4.6 mmol/L (ref 3.5–5.1)
Sodium: 136 mmol/L (ref 135–145)

## 2018-06-21 LAB — CBC
HCT: 26.2 % — ABNORMAL LOW (ref 36.0–46.0)
Hemoglobin: 8 g/dL — ABNORMAL LOW (ref 12.0–15.0)
MCH: 28.7 pg (ref 26.0–34.0)
MCHC: 30.5 g/dL (ref 30.0–36.0)
MCV: 93.9 fL (ref 80.0–100.0)
Platelets: 255 10*3/uL (ref 150–400)
RBC: 2.79 MIL/uL — ABNORMAL LOW (ref 3.87–5.11)
RDW: 17.4 % — ABNORMAL HIGH (ref 11.5–15.5)
WBC: 5.4 10*3/uL (ref 4.0–10.5)
nRBC: 0 % (ref 0.0–0.2)

## 2018-06-21 LAB — ANCA TITERS
Atypical P-ANCA titer: <1:20 {titer}
C-ANCA: <1:20 {titer}
P-ANCA: <1:20 {titer}

## 2018-06-21 LAB — MPO/PR-3 (ANCA) ANTIBODIES
ANCA Proteinase 3: <3.5 U/mL (ref 0.0–3.5)
Myeloperoxidase Abs: <9 U/mL (ref 0.0–9.0)

## 2018-06-21 MED ORDER — SODIUM BICARBONATE 650 MG PO TABS
1300.0000 mg | ORAL_TABLET | Freq: Two times a day (BID) | ORAL | Status: DC
  Administered 2018-06-21 – 2018-06-22 (×2): 1300 mg via ORAL
  Filled 2018-06-21 (×3): qty 2

## 2018-06-21 MED ORDER — EPOETIN ALFA 10000 UNIT/ML IJ SOLN
10000.0000 [IU] | INTRAMUSCULAR | Status: DC
  Administered 2018-06-21: 10000 [IU] via SUBCUTANEOUS
  Filled 2018-06-21: qty 1

## 2018-06-21 MED ORDER — ALBUMIN HUMAN 25 % IV SOLN: 25.0000 g | Freq: Once | INTRAVENOUS | Status: DC

## 2018-06-21 NOTE — Progress Notes (Addendum)
[facility] at [place] NAME: Amy [name]    MR#:  [id number]  DATE OF BIRTH:  06/11/1950  SUBJECTIVE:   States she is doing fine this morning.  She is still having some dysuria and urinary frequency, but this has improved from yesterday.  No fevers or chills.  No abdominal pain.  REVIEW OF SYSTEMS:  Review of Systems  Constitutional: Negative for chills and fever.  HENT: Negative for congestion and sore throat.   Eyes: Negative for blurred vision and double vision.  Respiratory: Negative for cough and shortness of breath.   Cardiovascular: Negative for chest pain and palpitations.  Gastrointestinal: Negative for abdominal pain, nausea and vomiting.  Genitourinary: Positive for dysuria and urgency.  Musculoskeletal: Negative for back pain and neck pain.  Neurological: Negative for dizziness and headaches.  Psychiatric/Behavioral: Negative for depression. The patient is not nervous/anxious.     DRUG ALLERGIES:   Allergies  Allergen Reactions  . Cephalosporins   . Clarithromycin   . Lunesta  [Eszopiclone]     Heart racing  . Penicillin V     Has patient had a PCN reaction causing immediate rash, facial/tongue/throat swelling, SOB or lightheadedness with hypotension: Yes Has patient had a PCN reaction causing severe rash involving mucus membranes or skin necrosis: No Has patient had a PCN reaction that required hospitalization No Has patient had a PCN reaction occurring within the last 10 years: No If all of the above answers are "NO", then may proceed with Cephalosporin use.  . Sulfa Antibiotics    VITALS:  Blood pressure (!) 160/67, pulse 77, temperature 98.8 F (37.1 C), temperature source Oral, resp. rate 20, height 5\' 4"  (1.626 m), weight 53.1 kg, SpO2 97 %. PHYSICAL EXAMINATION:  Physical Exam  GENERAL:  68 y.o.-year-old patient lying in the bed with no acute distress.  EYES: Pupils equal, round, reactive to light and  accommodation. No scleral icterus. Extraocular muscles intact.  HEENT: Head atraumatic, normocephalic. Oropharynx and nasopharynx clear.  NECK:  Supple, no jugular venous distention. No thyroid enlargement, no tenderness.  LUNGS: Normal breath sounds bilaterally, no wheezing, rales,rhonchi or crepitation. No use of accessory muscles of respiration.  CARDIOVASCULAR: RRR, S1, S2 normal. No murmurs, rubs, or gallops.  ABDOMEN: Soft, nondistended. Bowel sounds present. No organomegaly or mass. Non-tender. EXTREMITIES: No pedal edema, cyanosis, or clubbing.  NEUROLOGIC: Cranial nerves II through XII are intact. Muscle strength 5/5 in all extremities. Sensation intact. Gait not checked.  PSYCHIATRIC: The patient is alert and oriented x 3.  SKIN: No obvious rash, lesion, or ulcer.  LABORATORY PANEL:  Female CBC Recent Labs  Lab 06/21/18 0503  WBC 5.4  HGB 8.0*  HCT 26.2*  PLT 255   ------------------------------------------------------------------------------------------------------------------ Chemistries  Recent Labs  Lab 06/19/18 1317  06/21/18 0503  NA 136   < > 136  K 5.1   < > 4.6  CL 111   < > 114*  CO2 15*   < > 13*  GLUCOSE 105*   < > 105*  BUN 74*   < > 72*  CREATININE 4.34*   < > 4.52*  CALCIUM 9.0   < > 8.5*  AST 18  --   --   ALT 14  --   --   ALKPHOS 197*  --   --   BILITOT 0.5  --   --    < > = values in this interval not displayed.   RADIOLOGY:  No  results found. ASSESSMENT AND PLAN:   Acute kidney injury in CKD IV- concern for nephrotic syndrome- Cr improved from yesterday -Nephrology following- plan for renal biopsy today -Avoid nephrotoxic agents  E. Coli UTI -Levaquin 250 mg daily x 3 days -Follow-up urine culture susceptibilities  Hyperkalemia- due to AKI, resolved. -Continue lokelma  Uncontrolled hypertension- BPs are somewhat improved, but remain elevated. -Continue Norvasc, Coreg, clonidine, hydralazine, lisinopril -IV hydralazine  PRN  Chronic GERD- stable -PPI daily  Chronic GAD- stable -Continue Wellbutrin  Anemia of chronic kidney disease- Hgb dropped from 10.3 > 8.0 today, but she may have had some hemoconcentration yesterday, as all cell lines were high. -Monitor  All the records are reviewed and case discussed with Care Management/Social Worker. Management plans discussed with the patient, family and they are in agreement.  CODE STATUS: Full Code  TOTAL TIME TAKING CARE OF THIS PATIENT: 35 minutes.   More than 50% of the time was spent in counseling/coordination of care: YES  POSSIBLE D/C IN 2-3 DAYS, DEPENDING ON CLINICAL CONDITION.   Berna Spare Saben Donigan M.D on 06/21/2018 at 12:29 PM  Between 7am to 6pm - Pager - [phone]  After 6pm go to [email or web address] - Proofreader  [facility]  Office  [phone]  CC: Primary care physician; [name], MD  Note: This dictation was prepared with Dragon dictation along with smaller phrase technology. Any transcriptional errors that result from this process are unintentional.

## 2018-06-21 NOTE — Progress Notes (Signed)
[facility], [place] 06/21/18  Subjective:   Patient is known to our practice from outpatient.  She is admitted for evaluation of acute on chronic kidney disease stage IV in the setting of nephrotic syndrome.  Patient has had progressive worsening of creatinine over the past many months.  In May 2019, creatinine limits 2.78 which has progressed to 4.3 more recently.  She also had significant lower extremity edema and poorly controlled blood pressure.   Urine culture shows E. coli.  Antibiotic susceptibilities are pending.  Patient reports that she feels better but dysuria is not completely resolved  Objective:  Vital signs in last 24 hours:  Temp:  [98.2 F (36.8 C)-99 F (37.2 C)] 98.8 F (37.1 C) (02/19 0528) Pulse Rate:  [77-87] 85 (02/19 1313) Resp:  [18-20] 20 (02/19 0528) BP: (139-176)/(59-72) 159/70 (02/19 1313) SpO2:  [97 %] 97 % (02/19 0528)  Weight change:  Filed Weights   06/19/18 1325  Weight: 53.1 kg    Intake/Output:    Intake/Output Summary (Last 24 hours) at 06/21/2018 1623 Last data filed at 06/21/2018 0550 Gross per 24 hour  Intake -  Output 700 ml  Net -700 ml     Physical Exam: General:  No acute distress, sitting up in bed  HEENT  anicteric, moist oral mucous membranes  Neck  supple  Pulm/lungs  clear to auscultation bilaterally  CVS/Heart  regular rhythm, 2/6 murmur  Abdomen:   Soft, nontender, nondistended  Extremities:  1-2+ pitting edema bilaterally  Neurologic:  Alert, oriented  Skin:  No acute rashes    Basic Metabolic Panel:  Recent Labs  Lab 06/19/18 1317 06/20/18 0316 06/21/18 0503  NA 136 135 136  K 5.1 5.6* 4.6  CL 111 115* 114*  CO2 15* 13* 13*  GLUCOSE 105* 103* 105*  BUN 74* 80* 72*  CREATININE 4.34* 4.73* 4.52*  CALCIUM 9.0 8.1* 8.5*  PHOS  --   --  5.3*     CBC: Recent Labs  Lab 06/19/18 1317 06/21/18 0503  WBC 8.7 5.4  NEUTROABS 7.1  --   HGB 10.3* 8.0*  HCT 33.5* 26.2*  MCV  93.8 93.9  PLT 362 255     No results found for: HEPBSAG, HEPBSAB, HEPBIGM    Microbiology:  Recent Results (from the past 240 hour(s))  Urine Culture     Status: Abnormal (Preliminary result)   Collection Time: 06/19/18  4:00 PM  Result Value Ref Range Status   Specimen Description   Final    URINE, RANDOM Performed at [facility], [address], [place], [place] [address]    Special Requests   Final    NONE Performed at [facility], [address], [place], [place] [address]    Culture (A)  Final    >=100,000 COLONIES/mL ESCHERICHIA COLI SUSCEPTIBILITIES TO FOLLOW Performed at [facility], [place] [address] [place], [place] [address]    Report Status PENDING  Incomplete    Coagulation Studies: Recent Labs    06/19/18 1317  LABPROT 13.0  INR 0.99    Urinalysis: Recent Labs    06/19/18 1213  COLORURINE STRAW*  LABSPEC 1.011  PHURINE 5.0  GLUCOSEU NEGATIVE  HGBUR SMALL*  BILIRUBINUR NEGATIVE  KETONESUR NEGATIVE  PROTEINUR 100*  NITRITE NEGATIVE  LEUKOCYTESUR SMALL*      Imaging: No results found.   Medications:   . amLODipine  10 mg Oral Daily  . buPROPion  150 mg Oral Daily  .  carvedilol  25 mg Oral BID WC  . cloNIDine  0.3 mg Oral Q8H  . epoetin (EPOGEN/PROCRIT) injection  10,000 Units Subcutaneous Weekly  . famotidine  20 mg Oral Daily  . fluticasone  2 spray Each Nare Daily  . hydrALAZINE  100 mg Oral TID  . levofloxacin  250 mg Oral Daily  . lisinopril  10 mg Oral Daily  . sodium chloride flush  3 mL Intravenous Q12H  . sodium zirconium cyclosilicate  10 g Oral Daily  . torsemide  40 mg Oral q morning - 10a      Assessment/ Plan:  68 y.o. Caucasian female with hypertension, chronic kidney disease, history of nonsteroidal use, anemia, nephrotic proteinuria is admitted for possible kidney biopsy  Acute kidney injury on chronic kidney disease stage IV, nephrotic proteinuria -Creatinine has worsened from  3.50 from January 15 th to 4.5 now -She has nephrotic range proteinuria with protein creatinine ratio of 5.6 g Previous work-up has included negative SPEP, UPEP, normal kappa to lambda light chain ratio, normal complements, negative PLA 2R antibody, negative ANA, normal ESR -Renal biopsy tentatively scheduled for tomorrow at 10 AM provided blood pressure remains controlled  Hypertension with CKD and lower extremity edema -Continue outpatient regimen of amlodipine, carvedilol, clonidine, hydralazine, quinapril Volume control with torsemide Compression socks  Hyperkalemia - Low K Diet - Lokelma  UTI -Microbiology: E. coli -Continued on Levaquin       LOS: 2 Braleigh Massoud 2/19/20204:23 PM  [facility] [place], [place]  Note: This note was prepared with Dragon dictation. Any transcription errors are unintentional

## 2018-06-21 NOTE — Care Management (Signed)
Patient with stage IV and direct admission for nephrology for worsening renal failure. Renal biopsy pending when blood pressure is under better control

## 2018-06-22 ENCOUNTER — Other Ambulatory Visit: Payer: Self-pay | Admitting: Internal Medicine

## 2018-06-22 ENCOUNTER — Inpatient Hospital Stay: Payer: Medicare Other

## 2018-06-22 LAB — BASIC METABOLIC PANEL
Anion gap: 9 (ref 5–15)
BUN: 68 mg/dL — ABNORMAL HIGH (ref 8–23)
CO2: 14 mmol/L — ABNORMAL LOW (ref 22–32)
CREATININE: 4.45 mg/dL — AB (ref 0.44–1.00)
Calcium: 8.8 mg/dL — ABNORMAL LOW (ref 8.9–10.3)
Chloride: 113 mmol/L — ABNORMAL HIGH (ref 98–111)
GFR calc Af Amer: 11 mL/min — ABNORMAL LOW (ref >60)
GFR calc non Af Amer: 9 mL/min — ABNORMAL LOW (ref >60)
Glucose, Bld: 107 mg/dL — ABNORMAL HIGH (ref 70–99)
Potassium: 4.4 mmol/L (ref 3.5–5.1)
Sodium: 136 mmol/L (ref 135–145)

## 2018-06-22 LAB — URINE CULTURE: Culture: >=100000 — AB

## 2018-06-22 LAB — CBC
HCT: 30.2 % — ABNORMAL LOW (ref 36.0–46.0)
Hemoglobin: 8.9 g/dL — ABNORMAL LOW (ref 12.0–15.0)
MCH: 28.8 pg (ref 26.0–34.0)
MCHC: 29.5 g/dL — ABNORMAL LOW (ref 30.0–36.0)
MCV: 97.7 fL (ref 80.0–100.0)
Platelets: 277 10*3/uL (ref 150–400)
RBC: 3.09 MIL/uL — ABNORMAL LOW (ref 3.87–5.11)
RDW: 17.3 % — ABNORMAL HIGH (ref 11.5–15.5)
WBC: 5.6 10*3/uL (ref 4.0–10.5)
nRBC: 0 % (ref 0.0–0.2)

## 2018-06-22 MED ORDER — LEVOFLOXACIN 250 MG PO TABS: ORAL_TABLET | ORAL | 0 refills | Status: DC

## 2018-06-22 MED ORDER — HYDRALAZINE HCL 100 MG PO TABS: 100.0000 mg | ORAL_TABLET | Freq: Three times a day (TID) | ORAL | 0 refills | Status: DC

## 2018-06-22 MED ORDER — LEVOFLOXACIN 250 MG PO TABS: 250.0000 mg | ORAL_TABLET | ORAL | Status: DC

## 2018-06-22 MED ORDER — BUPROPION HCL 75 MG PO TABS: 150.0000 mg | ORAL_TABLET | Freq: Every day | ORAL | 0 refills | Status: DC

## 2018-06-22 MED ORDER — LISINOPRIL 10 MG PO TABS
10.0000 mg | ORAL_TABLET | Freq: Every day | ORAL | Status: DC
  Administered 2018-06-22: 10 mg via ORAL
  Filled 2018-06-22: qty 1

## 2018-06-22 MED ORDER — HYDRALAZINE HCL 50 MG PO TABS
100.0000 mg | ORAL_TABLET | Freq: Three times a day (TID) | ORAL | Status: DC
  Administered 2018-06-22 (×2): 100 mg via ORAL
  Filled 2018-06-22 (×2): qty 2

## 2018-06-22 MED ORDER — AMLODIPINE BESYLATE 5 MG PO TABS: 10.0000 mg | ORAL_TABLET | Freq: Every day | ORAL | 0 refills | Status: DC

## 2018-06-22 NOTE — Progress Notes (Addendum)
[place], [place] 06/22/18  Subjective:   Patient is known to our practice from outpatient.  She is admitted for evaluation of acute on chronic kidney disease stage IV in the setting of nephrotic syndrome.  Patient has had progressive worsening of creatinine over the past many months.  In May 2019, creatinine limits 2.78 which has progressed to 4.3 more recently.  She also had significant lower extremity edema and poorly controlled blood pressure.   Urine culture shows E. coli.  Antibiotic day #3  Objective:  Vital signs in last 24 hours:  Temp:  [98.6 F (37 C)-99.2 F (37.3 C)] 99.1 F (37.3 C) (02/20 0840) Pulse Rate:  [70-89] 77 (02/20 1023) Resp:  [18-19] 18 (02/20 0840) BP: (127-184)/(57-79) 167/77 (02/20 1023) SpO2:  [97 %-100 %] 98 % (02/20 1023)  Weight change:  Filed Weights   06/19/18 1325  Weight: 53.1 kg    Intake/Output:    Intake/Output Summary (Last 24 hours) at 06/22/2018 1032 Last data filed at 06/21/2018 1921 Gross per 24 hour  Intake 240 ml  Output 1000 ml  Net -760 ml     Physical Exam: General:  No acute distress, sitting up in bed  HEENT  anicteric, moist oral mucous membranes  Neck  supple  Pulm/lungs  clear to auscultation bilaterally  CVS/Heart  regular rhythm, 2/6 murmur  Abdomen:   Soft, nontender, nondistended  Extremities:  1-2+ pitting edema bilaterally  Neurologic:  Alert, oriented  Skin:  No acute rashes    Basic Metabolic Panel:  Recent Labs  Lab 06/19/18 1317 06/20/18 0316 06/21/18 0503 06/22/18 0533  NA 136 135 136 136  K 5.1 5.6* 4.6 4.4  CL 111 115* 114* 113*  CO2 15* 13* 13* 14*  GLUCOSE 105* 103* 105* 107*  BUN 74* 80* 72* 68*  CREATININE 4.34* 4.73* 4.52* 4.45*  CALCIUM 9.0 8.1* 8.5* 8.8*  PHOS  --   --  5.3*  --      CBC: Recent Labs  Lab 06/19/18 1317 06/21/18 0503 06/22/18 0533  WBC 8.7 5.4 5.6  NEUTROABS 7.1  --   --   HGB 10.3* 8.0* 8.9*  HCT 33.5* 26.2* 30.2*  MCV  93.8 93.9 97.7  PLT 362 255 277     No results found for: HEPBSAG, HEPBSAB, HEPBIGM    Microbiology:  Recent Results (from the past 240 hour(s))  Urine Culture     Status: Abnormal   Collection Time: 06/19/18  4:00 PM  Result Value Ref Range Status   Specimen Description   Final    URINE, RANDOM Performed at [facility], [address], [place], [place] [address]    Special Requests   Final    NONE Performed at [facility], [address] [place], [place] [address]    Culture >=100,000 COLONIES/mL ESCHERICHIA COLI (A)  Final   Report Status 06/22/2018 FINAL  Final   Organism ID, Bacteria ESCHERICHIA COLI (A)  Final      Susceptibility   Escherichia coli - MIC*    AMPICILLIN <=2 SENSITIVE Sensitive     CEFAZOLIN <=4 SENSITIVE Sensitive     CEFTRIAXONE <=1 SENSITIVE Sensitive     CIPROFLOXACIN <=0.25 SENSITIVE Sensitive     GENTAMICIN <=1 SENSITIVE Sensitive     IMIPENEM <=0.25 SENSITIVE Sensitive     NITROFURANTOIN <=16 SENSITIVE Sensitive     TRIMETH/SULFA <=20 SENSITIVE Sensitive     AMPICILLIN/SULBACTAM <=2 SENSITIVE Sensitive     PIP/TAZO <=4  SENSITIVE Sensitive     Extended ESBL NEGATIVE Sensitive     * >=100,000 COLONIES/mL ESCHERICHIA COLI    Coagulation Studies: Recent Labs    06/19/18 1317  LABPROT 13.0  INR 0.99    Urinalysis: Recent Labs    06/19/18 1213  COLORURINE STRAW*  LABSPEC 1.011  PHURINE 5.0  GLUCOSEU NEGATIVE  HGBUR SMALL*  BILIRUBINUR NEGATIVE  KETONESUR NEGATIVE  PROTEINUR 100*  NITRITE NEGATIVE  LEUKOCYTESUR SMALL*      Imaging: No results found.   Medications:   . amLODipine  10 mg Oral Daily  . buPROPion  150 mg Oral Daily  . carvedilol  25 mg Oral BID WC  . cloNIDine  0.3 mg Oral Q8H  . epoetin (EPOGEN/PROCRIT) injection  10,000 Units Subcutaneous Weekly  . famotidine  20 mg Oral Daily  . fluticasone  2 spray Each Nare Daily  . hydrALAZINE  100 mg Oral TID  . levofloxacin  250 mg Oral  Daily  . lisinopril  10 mg Oral Daily  . sodium bicarbonate  1,300 mg Oral BID  . sodium chloride flush  3 mL Intravenous Q12H  . sodium zirconium cyclosilicate  10 g Oral Daily  . torsemide  40 mg Oral q morning - 10a      Assessment/ Plan:  68 y.o. Caucasian female with hypertension, chronic kidney disease, history of nonsteroidal use, anemia, nephrotic proteinuria is admitted for possible kidney biopsy  Acute kidney injury on chronic kidney disease stage IV, nephrotic proteinuria -Creatinine has worsened from 3.50 from January 15 th to 4.5 now -She has nephrotic range proteinuria with protein creatinine ratio of 5.6 g Previous work-up has included negative SPEP, UPEP, normal kappa to lambda light chain ratio, normal complements, negative PLA 2R antibody, negative ANA, normal ESR -Renal biopsy today  Hypertension with CKD and lower extremity edema -Continue outpatient regimen of amlodipine, carvedilol, clonidine, hydralazine, quinapril Volume control with torsemide Compression socks  Hyperkalemia - Low K Diet - [place]  UTI -Microbiology: E. coli -Continued on Levaquin   Results for KEYANI, RIGDON (MRN [id number]) as of 06/22/2018 10:31  Ref. Range 09/08/2016 13:10 10/05/2016 12:48 03/16/2017 14:46 06/20/2018 03:16  Anti Nuclear Antibody(ANA) Latest Ref Range: Negative   Negative    ANCA Proteinase 3 Latest Ref Range: 0.0 - 3.5 U/mL    <3.5  GBM Ab Latest Units: AI   <1.0   Myeloperoxidase Abs Latest Ref Range: 0.0 - 9.0 U/mL    <9.0  Cytoplasmic (C-ANCA) Latest Ref Range: Neg:<1:20 titer    <1:20  P-ANCA Latest Ref Range: Neg:<1:20 titer    <1:20  Atypical P-ANCA titer Latest Ref Range: Neg:<1:20 titer    <1:20  Kappa free light chain Latest Ref Range: 3.3 - 19.4 mg/L 15.5     Lamda free light chains Latest Ref Range: 5.7 - 26.3 mg/L 11.7     Kappa, lamda light chain ratio Latest Ref Range: 0.26 - 1.65  1.32      When patient was brought to the ultrasound suite, she was  noted to have interval development of calyectasis.  Biopsy was deferred for 2 to 3 weeks.    LOS: [place] 2/20/202010:32 AM  [place], [place]  Note: This note was prepared with Dragon dictation. Any transcription errors are unintentional

## 2018-06-22 NOTE — Discharge Instructions (Signed)
It was so nice to meet you during this hospitalization!  You came into the hospital with worsening kidney function. You were found to have a UTI. We are going to delay your kidney biopsy until next week.  I have prescribed the following medications: 1. Take levaquin 250mg  every other day (please take this on 2/22 and 2/24)- this is the antibiotic 2. We have increased your hydralazine dose from 50mg  three times a day to 100mg  three times a day. Please pick up the new prescription from your pharmacy.  Take care, Dr. Brett Albino

## 2018-06-22 NOTE — Care Management Important Message (Signed)
Important Message  Patient Details  Name: Amy [name] MRN: [id number] Date of Birth: Feb 17, 1951   Medicare Important Message Given:  Yes    [name] 06/22/2018, 10:55 AM

## 2018-06-22 NOTE — Discharge Summary (Signed)
Amy [name]: Amy [name]    MR#:  [id number]  [place]:  1951-01-16  DATE OF ADMISSION:  06/19/2018   ADMITTING PHYSICIAN: Hillary Bow, MD  DATE OF DISCHARGE: 06/22/2018  PRIMARY CARE PHYSICIAN: [name], MD   ADMISSION DIAGNOSIS:  AKI CKD 4 Hypertension  DISCHARGE DIAGNOSIS:  Active Problems:   ARF (acute renal failure) ([place])  SECONDARY DIAGNOSIS:   Past Medical History:  Diagnosis Date  . Anemia    a. 08/2016 Guaiac + stool. EGD w/ gastritis.  . Anxiety   . CKD (chronic kidney disease), stage III ([place])   . Colon polyp    a. 08/2016 Colonoscopy.  . Gastritis    a. 08/2016 EGD: Gastritis-->PPI.  [name] GERD (gastroesophageal reflux disease)   . History of chicken pox   . History of measles as a child   . Hypertension   . Hyponatremia    a. 08/2016 in setting of HCTZ Rx.  . Multiple thyroid nodules    HOSPITAL COURSE:   [name] is a 68 year old female who was directly admitted from the nephrology office for acute renal failure and CKD 4.  Acute kidney injury in CKD IV- concern for nephrotic syndrome- Cr improved  -Nephrology following-initial plan was to do renal biopsy this admission, but this was rescheduled in the setting of acute UTI -Needs to follow-up with nephrology next week for renal biopsy as an outpatient  E. Coli UTI- patient endorsed dysuria, frequency, and urgency on admission -Treated with Levaquin for a total 7-day course (patient has multiple antibiotic allergies) -Urine culture grew pansensitive E. coli  Hyperkalemia- due to AKI, resolved.  Uncontrolled hypertension- BPs were elevated throughout admission -Continued Norvasc, Coreg, clonidine, quinapril -Hydralazine dose increased from 50 mg 3 times daily to 100 mg 3 times daily -Needs continued close blood pressure monitoring as an outpatient  Chronic GERD- stable -PPI daily  Chronic GAD- stable -Continued  Wellbutrin  Anemia of chronic kidney disease- hemoglobin at baseline.  Continue to monitor as an outpatient.  DISCHARGE CONDITIONS:  CKD 4 E. coli UTI Uncontrolled hypertension Chronic GERD Chronic anxiety Anemia of chronic disease CONSULTS OBTAINED:  Treatment Team:  Murlean Iba, MD DRUG ALLERGIES:   Allergies  Allergen Reactions  . Cephalosporins   . Clarithromycin   . Lunesta  [Eszopiclone]     Heart racing  . Penicillin V     Has patient had a PCN reaction causing immediate rash, facial/tongue/throat swelling, SOB or lightheadedness with hypotension: Yes Has patient had a PCN reaction causing severe rash involving mucus membranes or skin necrosis: No Has patient had a PCN reaction that required hospitalization No Has patient had a PCN reaction occurring within the last 10 years: No If all of the above answers are "NO", then may proceed with Cephalosporin use.  . Sulfa Antibiotics    DISCHARGE MEDICATIONS:   Allergies as of 06/22/2018      Reactions   Cephalosporins    Clarithromycin    Lunesta  [eszopiclone]    Heart racing   Penicillin V    Has patient had a PCN reaction causing immediate rash, facial/tongue/throat swelling, SOB or lightheadedness with hypotension: Yes Has patient had a PCN reaction causing severe rash involving mucus membranes or skin necrosis: No Has patient had a PCN reaction that required hospitalization No Has patient had a PCN reaction occurring within the last 10 years: No If all of the above answers are "NO", then  may proceed with Cephalosporin use.   Sulfa Antibiotics       Medication List    TAKE these medications   amLODipine 5 MG tablet Commonly known as:  NORVASC Take 2 tablets (10 mg total) by mouth daily. What changed:  See the new instructions.   aspirin 81 MG EC tablet Take 1 tablet (81 mg total) by mouth daily.   buPROPion 75 MG tablet Commonly known as:  WELLBUTRIN Take 2 tablets (150 mg total) by mouth daily.    butalbital-acetaminophen-caffeine 50-325-40 MG tablet Commonly known as:  FIORICET, ESGIC Take 1-2 tablets by mouth every 4 (four) hours as needed for headache.   carvedilol 25 MG tablet Commonly known as:  COREG TAKE 1 TABLET(25 MG) BY MOUTH TWICE DAILY WITH A MEAL   cloNIDine 0.3 MG tablet Commonly known as:  CATAPRES Take 1 tablet (0.3 mg total) by mouth every 8 (eight) hours.   hydrALAZINE 100 MG tablet Commonly known as:  APRESOLINE Take 1 tablet (100 mg total) by mouth 3 (three) times daily. What changed:    medication strength  how much to take  additional instructions   levofloxacin 250 MG tablet Commonly known as:  LEVAQUIN Take 1 tablet by mouth on 2/22 and 2/24 Start taking on:  June 24, 2018   LORazepam 1 MG tablet Commonly known as:  ATIVAN Take 1 tablet (1 mg total) by mouth 2 (two) times daily as needed.   quinapril 5 MG tablet Commonly known as:  ACCUPRIL Take 10 mg by mouth daily.   torsemide 20 MG tablet Commonly known as:  DEMADEX Take 40 mg by mouth every morning.   zolpidem 10 MG tablet Commonly known as:  AMBIEN TAKE 1 TABLET BY MOUTH AT BEDTIME AS NEEDED.        DISCHARGE INSTRUCTIONS:  1.  Follow-up with PCP in 5 days 2.  Follow-up with nephrology in 1 week 3.  Hydralazine dose increased from 50 mg 3 times daily to 100 mg 3 times daily 4.  Take Levaquin every other day for a total 7-day course for E. coli UTI 5.  Needs close blood pressure monitoring as an outpatient DIET:  Renal diet DISCHARGE CONDITION:  Stable ACTIVITY:  Activity as tolerated OXYGEN:  Home Oxygen: No.  Oxygen Delivery: room air DISCHARGE LOCATION:  home   If you experience worsening of your admission symptoms, develop shortness of breath, life threatening emergency, suicidal or homicidal thoughts you must seek medical attention immediately by calling 911 or calling your MD immediately  if symptoms less severe.  You Must read complete  instructions/literature along with all the possible adverse reactions/side effects for all the Medicines you take and that have been prescribed to you. Take any new Medicines after you have completely understood and accpet all the possible adverse reactions/side effects.   Please note  You were cared for by a hospitalist during your hospital stay. If you have any questions about your discharge medications or the care you received while you were in the hospital after you are discharged, you can call the unit and asked to speak with the hospitalist on call if the hospitalist that took care of you is not available. Once you are discharged, your primary care physician will handle any further medical issues. Please note that NO REFILLS for any discharge medications will be authorized once you are discharged, as it is imperative that you return to your primary care physician (or establish a relationship with a primary care physician if  you do not have one) for your aftercare needs so that they can reassess your need for medications and monitor your lab values.    On the day of Discharge:  VITAL SIGNS:  Blood pressure (!) 156/66, pulse 74, temperature 98.6 F (37 C), temperature source Oral, resp. rate 14, height 5\' 4"  (1.626 m), weight 53.1 kg, SpO2 98 %. PHYSICAL EXAMINATION:  GENERAL:  67 y.o.-year-old patient lying in the bed with no acute distress.  EYES: Pupils equal, round, reactive to light and accommodation. No scleral icterus. Extraocular muscles intact.  HEENT: Head atraumatic, normocephalic. Oropharynx and nasopharynx clear.  NECK:  Supple, no jugular venous distention. No thyroid enlargement, no tenderness.  LUNGS: Normal breath sounds bilaterally, no wheezing, rales,rhonchi or crepitation. No use of accessory muscles of respiration.  CARDIOVASCULAR: S1, S2 normal. No murmurs, rubs, or gallops.  ABDOMEN: Soft, non-tender, non-distended. Bowel sounds present. No organomegaly or mass.    EXTREMITIES: No pedal edema, cyanosis, or clubbing.  NEUROLOGIC: Cranial nerves II through XII are intact. Muscle strength 5/5 in all extremities. Sensation intact. Gait not checked.  PSYCHIATRIC: The patient is alert and oriented x 3.  SKIN: No obvious rash, lesion, or ulcer.  DATA REVIEW:   CBC Recent Labs  Lab 06/22/18 0533  WBC 5.6  HGB 8.9*  HCT 30.2*  PLT 277    Chemistries  Recent Labs  Lab 06/19/18 1317  06/22/18 0533  NA 136   < > 136  K 5.1   < > 4.4  CL 111   < > 113*  CO2 15*   < > 14*  GLUCOSE 105*   < > 107*  BUN 74*   < > 68*  CREATININE 4.34*   < > 4.45*  CALCIUM 9.0   < > 8.8*  AST 18  --   --   ALT 14  --   --   ALKPHOS 197*  --   --   BILITOT 0.5  --   --    < > = values in this interval not displayed.     Microbiology Results  Results for orders placed or performed during the hospital encounter of 06/19/18  Urine Culture     Status: Abnormal   Collection Time: 06/19/18  4:00 PM  Result Value Ref Range Status   Specimen Description   Final    URINE, RANDOM Performed at [facility], [address], [place], [place] [address]    Special Requests   Final    NONE Performed at [facility], [place], [place] [address]    Culture >=100,000 COLONIES/mL ESCHERICHIA COLI (A)  Final   Report Status 06/22/2018 FINAL  Final   Organism ID, Bacteria ESCHERICHIA COLI (A)  Final      Susceptibility   Escherichia coli - MIC*    AMPICILLIN <=2 SENSITIVE Sensitive     CEFAZOLIN <=4 SENSITIVE Sensitive     CEFTRIAXONE <=1 SENSITIVE Sensitive     CIPROFLOXACIN <=0.25 SENSITIVE Sensitive     GENTAMICIN <=1 SENSITIVE Sensitive     IMIPENEM <=0.25 SENSITIVE Sensitive     NITROFURANTOIN <=16 SENSITIVE Sensitive     TRIMETH/SULFA <=20 SENSITIVE Sensitive     AMPICILLIN/SULBACTAM <=2 SENSITIVE Sensitive     PIP/TAZO <=4 SENSITIVE Sensitive     Extended ESBL NEGATIVE Sensitive     * >=100,000 COLONIES/mL ESCHERICHIA COLI     RADIOLOGY:  US Renal  Result Date: 06/22/2018 CLINICAL DATA:  Proteinuria. Patient was initially  to undergo ultrasound-guided random renal biopsy however procedure was canceled by performing nephrologist given concern pelvicaliectasis. EXAM: RENAL / URINARY TRACT ULTRASOUND COMPLETE COMPARISON:  Renal ultrasound-06/13/2018; CT abdomen pelvis-09/26/2016 FINDINGS: Right Kidney: Renal measurements: 10.2 x 4.5 x 5.3 cm = volume: 125 mL. While the right kidney is normal in size with preserved cortical thickness, there is grossly unchanged diffuse increased cortical echogenicity. No discrete renal lesions. No echogenic renal stones. Apparent increased caliectasis compared to the 06/13/2018 examination. No definitive pelviectasis or distension of the imaged proximal aspect the right ureter. Left Kidney: Renal measurements: 10.2 x 5.0 x 5.2 cm = volume: 139 mL. While the left kidney is normal in size with preserved cortical thickness, there is grossly unchanged diffuse increased cortical echogenicity. No discrete left-sided renal lesions. No echogenic renal stones. Similar to the contralateral right side, there has been interval development of caliectasis compared to the 06/13/2018 examination. No definitive pelviectasis or distension of the imaged proximal aspect the left ureter. Bladder: Appears normal for degree of bladder distention. Note is made of a small left-sided pleural effusion (image 20). IMPRESSION: 1. Diffuse increased cortical echogenicity worrisome for medical renal disease. 2. Interval development of apparent caliectasis without pelviectasis or ureterectasis - this finding was confirmed with real-time sonographic evaluation performed by the dictating interventional radiologist and given patient's current urinary tract infection, the decision was made NOT to proceed with ultrasound-guided random renal biopsy at this time. PLAN: - The patient will complete her prescribed course of antibiotics and  undergo repeat urinalysis. - Once urinalysis has normalized, the patient may return for ultrasound-guided random renal biopsy as indicated. Electronically Signed   By: Sandi Mariscal M.D.   On: 06/22/2018 11:56     Management plans discussed with the patient, family and they are in agreement.  CODE STATUS: Full Code   TOTAL TIME TAKING CARE OF THIS PATIENT: 40 minutes.    Berna Spare Maryella Abood M.D on 06/22/2018 at 2:29 PM  Between 7am to 6pm - Pager [phone]  After 6pm go to [email or web address] - Proofreader  [facility]  Office  [phone]  CC: Primary care physician; [name], MD   Note: This dictation was prepared with Dragon dictation along with smaller phrase technology. Any transcriptional errors that result from this process are unintentional.

## 2018-06-23 ENCOUNTER — Telehealth: Payer: Self-pay

## 2018-06-23 MED ORDER — LEVOFLOXACIN 250 MG PO TABS: ORAL_TABLET | ORAL | 0 refills | Status: DC

## 2018-06-23 NOTE — Telephone Encounter (Signed)
Transition Care Management Follow-up Telephone Call  Date of discharge and from where: [facility] on 06/22/18  How have you been since you were released from the hospital? Still having lower ban pain that catches with movement and aches all across her back. Pt does not feel any better. Urine is cloudy and still burns when urination. Declines fever or n/v/d.  Any questions or concerns? Concerned about getting rid of UTI. Pt does not feel the levaquin is working.   Items Reviewed:  Did the pt receive and understand the discharge instructions provided? Yes   Medications obtained and verified? Yes   Any new allergies since your discharge? No   Dietary orders reviewed? Yes  Do you have support at home? Yes   Other (ie: DME, Home Health, etc) N/A  Functional Questionnaire: (I = Independent and D = Dependent)  Bathing/Dressing- I   Meal Prep- I  Eating- I  Maintaining continence- I  Transferring/Ambulation- I  Managing Meds- I   Follow up appointments reviewed:    PCP Hospital f/u appt confirmed? No, no available HFU apts next week.    [facility] f/u appt confirmed? Yes   Are transportation arrangements needed? No   If their condition worsens, is the pt aware to call  their PCP or go to the ED? Yes  Was the patient provided with contact information for the PCP's office or ED? Yes  Was the pt encouraged to call back with questions or concerns? Yes

## 2018-06-23 NOTE — Telephone Encounter (Signed)
No HFU scheduled.  

## 2018-06-23 NOTE — Telephone Encounter (Signed)
She is allergic to cephalosporins, and sulfa which are the only alternatives. Cultures show infection is sensitive to levoquin. She only needs to take it every other day due to kidney function. Please send in prescription levofloxacin 250mg  every other day #3.  She can have a 20 minute slot next week for hospital follow up. Can have same day slot if that is all that is available.

## 2018-06-23 NOTE — Telephone Encounter (Signed)
Please see note below. Pt does not feel that Levaquin is working as she still has all of the s/s and her lower back is causing a lot of pain. Pt would like to know if you will call her in something else.  Also pt is wanting to see you next week for her HFU and there are no available apts. Is there anywhere she can be worked in? Pt would like to discuss changing nephrologist with you as well. Please advise, thank you. -MM

## 2018-06-23 NOTE — Telephone Encounter (Signed)
Pt advised apt made for 06/27/2018 at 1:40pm. RX sent to [place]. [address].   Thanks,   -Mickel Baas

## 2018-06-27 ENCOUNTER — Encounter: Payer: Self-pay | Admitting: Family Medicine

## 2018-06-27 ENCOUNTER — Ambulatory Visit (INDEPENDENT_AMBULATORY_CARE_PROVIDER_SITE_OTHER): Payer: Medicare Other | Admitting: Family Medicine

## 2018-06-27 VITALS — BP 122/60 | HR 69 | Temp 97.6°F | Resp 16 | Wt 110.0 lb

## 2018-06-27 DIAGNOSIS — N185 Chronic kidney disease, stage 5: Secondary | ICD-10-CM | POA: Diagnosis not present

## 2018-06-27 DIAGNOSIS — N390 Urinary tract infection, site not specified: Secondary | ICD-10-CM

## 2018-06-27 DIAGNOSIS — N184 Chronic kidney disease, stage 4 (severe): Secondary | ICD-10-CM | POA: Diagnosis not present

## 2018-06-27 DIAGNOSIS — I129 Hypertensive chronic kidney disease with stage 1 through stage 4 chronic kidney disease, or unspecified chronic kidney disease: Secondary | ICD-10-CM | POA: Diagnosis not present

## 2018-06-27 DIAGNOSIS — R600 Localized edema: Secondary | ICD-10-CM | POA: Diagnosis not present

## 2018-06-27 DIAGNOSIS — I150 Renovascular hypertension: Secondary | ICD-10-CM

## 2018-06-27 DIAGNOSIS — R809 Proteinuria, unspecified: Secondary | ICD-10-CM | POA: Diagnosis not present

## 2018-06-27 LAB — POCT URINALYSIS DIPSTICK
Bilirubin, UA: NEGATIVE
Blood, UA: NEGATIVE
Glucose, UA: NEGATIVE
Ketones, UA: NEGATIVE
Leukocytes, UA: NEGATIVE
Nitrite, UA: NEGATIVE
Protein, UA: POSITIVE — AB
Spec Grav, UA: 1.02 (ref 1.010–1.025)
UROBILINOGEN UA: 0.2 U/dL
pH, UA: 6 (ref 5.0–8.0)

## 2018-06-27 NOTE — Progress Notes (Signed)
Patient: Amy [name] Female    DOB: [birth year]   68 y.o.   MRN: [id number] Visit Date: 06/27/2018  Today's Provider: Lelon Huh, MD   Chief Complaint  Patient presents with  . Hospitalization Follow-up   Subjective:     HPI  Follow up Hospitalization  Patient was admitted to [facility] on 06/19/2018 and discharged on 06/22/2018. She was treated for Acute Renal Failure and hyperension Patient was initially admitted for plans to have a renal biopsy, but this was rescheduled due to UTI and very high blood pressure. Patient was prescribed a 7 day course of Levaquin for UTI. Patient was later changed to Levofloxacin 250mg  every other day. The dosage of  Hydralazine was increased to 100mg  three times daily. Per discharge summary, patient was to follow up with Nephrology for renal biopsy as an outpatient. Telephone follow up was done on 06/23/2018 She reports good compliance with treatment. She reports this condition is Unchanged. Patient still has burning during urination and lower back pain. Patient has an appointment to follow up with Nephrology today at 3:20pm.   She does states she feels like she has had a little chest congestion th last couple of days. No fevers, sinus pain or pressure.  ------------------------------------------------------------------------------------    Allergies  Allergen Reactions  . Cephalosporins   . Clarithromycin   . Lunesta  [Eszopiclone]     Heart racing  . Penicillin V     Has patient had a PCN reaction causing immediate rash, facial/tongue/throat swelling, SOB or lightheadedness with hypotension: Yes Has patient had a PCN reaction causing severe rash involving mucus membranes or skin necrosis: No Has patient had a PCN reaction that required hospitalization No Has patient had a PCN reaction occurring within the last 10 years: No If all of the above answers are "NO", then may proceed with Cephalosporin use.  . Sulfa Antibiotics       Current Outpatient Medications:  .  amLODipine (NORVASC) 5 MG tablet, Take 2 tablets (10 mg total) by mouth daily., Disp: 90 tablet, Rfl: 0 .  aspirin EC 81 MG EC tablet, Take 1 tablet (81 mg total) by mouth daily., Disp: , Rfl:  .  buPROPion (WELLBUTRIN) 75 MG tablet, Take 2 tablets (150 mg total) by mouth daily., Disp: 180 tablet, Rfl: 0 .  butalbital-acetaminophen-caffeine (FIORICET, ESGIC) 50-325-40 MG tablet, Take 1-2 tablets by mouth every 4 (four) hours as needed for headache., Disp: 150 tablet, Rfl: 3 .  carvedilol (COREG) 25 MG tablet, TAKE 1 TABLET(25 MG) BY MOUTH TWICE DAILY WITH A MEAL, Disp: 180 tablet, Rfl: 1 .  cloNIDine (CATAPRES) 0.3 MG tablet, Take 1 tablet (0.3 mg total) by mouth every 8 (eight) hours., Disp: 90 tablet, Rfl: 1 .  hydrALAZINE (APRESOLINE) 100 MG tablet, Take 1 tablet (100 mg total) by mouth every 8 (eight) hours as needed., Disp: 270 tablet, Rfl: 1 .  LORazepam (ATIVAN) 1 MG tablet, Take 1 tablet (1 mg total) by mouth 2 (two) times daily as needed., Disp: 180 tablet, Rfl: 1 .  quinapril (ACCUPRIL) 5 MG tablet, Take 10 mg by mouth daily. , Disp: , Rfl:  .  torsemide (DEMADEX) 20 MG tablet, Take 40 mg by mouth every morning., Disp: , Rfl:  .  zolpidem (AMBIEN) 10 MG tablet, TAKE 1 TABLET BY MOUTH AT BEDTIME AS NEEDED., Disp: 30 tablet, Rfl: 3 .  levofloxacin (LEVAQUIN) 250 MG tablet, Take one tablet every other day. (Patient not taking: Reported on 06/27/2018),  Disp: 3 tablet, Rfl: 0  Review of Systems  Constitutional: Negative for appetite change, chills, fatigue and fever.  Respiratory: Negative for chest tightness and shortness of breath.   Cardiovascular: Negative for chest pain and palpitations.  Gastrointestinal: Negative for abdominal pain, nausea and vomiting.  Genitourinary: Positive for dysuria.  Musculoskeletal: Positive for back pain.  Neurological: Negative for dizziness and weakness.    Social History   Tobacco Use  . Smoking status:  Never Smoker  . Smokeless tobacco: Never Used  Substance Use Topics  . Alcohol use: Yes    Alcohol/week: 0.0 - 2.0 standard drinks      Objective:   BP 122/60 (BP Location: Left Arm, Patient Position: Sitting, Cuff Size: Normal)   Pulse 69   Temp 97.6 F (36.4 C) (Oral)   Resp 16   Wt 110 lb (49.9 kg)   SpO2 99%   BMI 18.88 kg/m     Physical Exam   General Appearance:    Alert, cooperative, no distress  Eyes:    PERRL, conjunctiva/corneas clear, EOM's intact       Lungs:     Clear to auscultation bilaterally, respirations unlabored  Heart:    Regular rate and rhythm  Neurologic:   Awake, alert, oriented x 3. No apparent focal neurological           defect.       Results for orders placed or performed in visit on 06/27/18  POCT Urinalysis Dipstick  Result Value Ref Range   Color, UA yellow    Clarity, UA clear    Glucose, UA Negative Negative   Bilirubin, UA negative    Ketones, UA negative    Spec Grav, UA 1.020 1.010 - 1.025   Blood, UA negative    pH, UA 6.0 5.0 - 8.0   Protein, UA Positive (A) Negative   Urobilinogen, UA 0.2 0.2 or 1.0 E.U./dL   Nitrite, UA negative    Leukocytes, UA Negative Negative   Appearance     Odor         Assessment & Plan    1. Urinary tract infection without hematuria, site unspecified Resolved, finished with levofloxacin   - POCT Urinalysis Dipstick  2. CKD (chronic kidney disease) stage 5, GFR less than 15 ml/min (HCC) Follow up with Dr. Candiss Norse this afternoon as scheduled.   3. Renovascular hypertension Much better taking antihypertensives consistently as prescribed and tolerating increased dose of hydralazine.     Lelon Huh, MD  [facility]

## 2018-06-27 NOTE — Patient Instructions (Signed)
.   Please review the attached list of medications and notify my office if there are any errors.   . Please bring all of your medications to every appointment so we can make sure that our medication list is the same as yours.   

## 2018-06-30 ENCOUNTER — Other Ambulatory Visit: Payer: Self-pay | Admitting: Family Medicine

## 2018-06-30 ENCOUNTER — Other Ambulatory Visit: Payer: Self-pay | Admitting: Nephrology

## 2018-06-30 ENCOUNTER — Other Ambulatory Visit (HOSPITAL_COMMUNITY): Payer: Self-pay | Admitting: Nephrology

## 2018-06-30 DIAGNOSIS — R809 Proteinuria, unspecified: Secondary | ICD-10-CM

## 2018-06-30 DIAGNOSIS — N184 Chronic kidney disease, stage 4 (severe): Secondary | ICD-10-CM

## 2018-06-30 DIAGNOSIS — J329 Chronic sinusitis, unspecified: Secondary | ICD-10-CM

## 2018-06-30 DIAGNOSIS — B001 Herpesviral vesicular dermatitis: Secondary | ICD-10-CM

## 2018-06-30 MED ORDER — AMOXICILLIN-POT CLAVULANATE 500-125 MG PO TABS: 1.0000 | ORAL_TABLET | Freq: Two times a day (BID) | ORAL | 0 refills | Status: AC

## 2018-07-02 ENCOUNTER — Other Ambulatory Visit: Payer: Self-pay | Admitting: Family Medicine

## 2018-07-10 ENCOUNTER — Inpatient Hospital Stay: Payer: Medicare Other | Attending: Oncology

## 2018-07-10 ENCOUNTER — Inpatient Hospital Stay: Payer: Medicare Other

## 2018-07-10 DIAGNOSIS — D631 Anemia in chronic kidney disease: Secondary | ICD-10-CM | POA: Insufficient documentation

## 2018-07-10 DIAGNOSIS — N184 Chronic kidney disease, stage 4 (severe): Secondary | ICD-10-CM | POA: Insufficient documentation

## 2018-07-13 ENCOUNTER — Ambulatory Visit: Payer: Medicare Other

## 2018-07-14 ENCOUNTER — Ambulatory Visit: Admission: RE | Admit: 2018-07-14 | Payer: Medicare Other | Source: Ambulatory Visit

## 2018-07-18 ENCOUNTER — Ambulatory Visit: Payer: Medicare Other | Admitting: Cardiovascular Disease

## 2018-07-18 ENCOUNTER — Telehealth: Payer: Self-pay | Admitting: Cardiovascular Disease

## 2018-07-18 NOTE — Telephone Encounter (Signed)
Spoke with patient and she reports not feeling well with recent travel via plane and has some cold like symptoms. Reviewed medications and checked to see if she had any provider specific questions. She denies any current problems or questions and let her know that scheduling would call back at a later date to reschedule this appointment after current health crisis.

## 2018-07-18 NOTE — Progress Notes (Incomplete)
Cardiology Office Note  Date:  07/18/2018   ID:  DAFNE NIELD, DOB 1951/04/12, MRN [id number]  PCP:  [name], MD   No chief complaint on file.   HPI:  [name] is a 68 year old woman with history of  Hypertension chronic renal insufficiency,  anemia chronic headaches managed with pain medication,  EGD confirming gastritis,  hospital admission for hyponatremia for which she received IV fluids and diuretic was held,   hospital admission for chest pain Felt secondary to gastritis Who presents for follow-up of her  essential hypertension, new leg swelling  INTERVAL HISTORY: The patient reports today for follow up.  ***  Today's Blood pressure ***/*** Total Chol ***/ LDL *** HBA1C *** CR *** Glucose ***   EKG personally reviewed by myself on todays visit Shows *** rhythm. *** bpm. ***   {Reports having worsening lower extremity edema that is pitting She has increased her Lasix now up to 3 times a day Swelling worse in the past 2 to 3 weeks Denies excessive fluid intake  Difficulty with chronic anemia felt secondary to underlying renal dysfunction Reports having EGD colonoscopy which were unrevealing Receives Procrit every 3 weeks Recent hemoglobin 7 now up to 8 She does not like to wear compression hose  Iron levels ok, B12 within normal range CR 1.6, BUN 26, Dr. Candiss Norse (10/2016)  Now creatinine up to 3, last lab in our system August 2019  She reports blood pressures consistently elevated but does not check it at home Blood pressure elevated on today's visit No chest pain Chronic fatigue  EKG personally reviewed by myself on todays visit shows normal sinus rhythm  with rate 66 bpm with no significant ST or T-wave changes}  OTHER PAST MEDICAL HISTORY REVIEWED BY ME FOR TODAY'S VISIT:  Previous chest pain with atypical in nature, possible anxiety    PMH:   has a past medical history of Anemia, Anxiety, CKD (chronic kidney disease), stage III ([place]),  Colon polyp, Gastritis, GERD (gastroesophageal reflux disease), History of chicken pox, History of measles as a child, Hypertension, Hyponatremia, and Multiple thyroid nodules.  PSH:    Past Surgical History:  Procedure Laterality Date   ABDOMINAL HYSTERECTOMY  2003   due to heavy periods and clotting during menses   BREAST LUMPECTOMY Left 1990   CHOLECYSTECTOMY  2010   COLONOSCOPY WITH PROPOFOL N/A 09/07/2016   Procedure: COLONOSCOPY WITH PROPOFOL;  Surgeon: Lucilla Lame, MD;  Location: [facility];  Service: Endoscopy;  Laterality: N/A;   DILATION AND CURETTAGE OF UTERUS  1985   ESOPHAGOGASTRODUODENOSCOPY (EGD) WITH PROPOFOL N/A 09/07/2016   Procedure: ESOPHAGOGASTRODUODENOSCOPY (EGD) WITH PROPOFOL;  Surgeon: Lucilla Lame, MD;  Location: [facility];  Service: Endoscopy;  Laterality: N/A;   GALLBLADDER SURGERY  2012   KNEE SURGERY     Dr. Tamala Julian   MRI, Lumbar spine  07/23/2009   Multilevel annular bulge and disc protrusion at L2-L3, L3-L4, L4-L5, and L5-S1   TONSILLECTOMY     TONSILLECTOMY AND ADENOIDECTOMY  1970   UPPER GI ENDOSCOPY  03/02/2006   H. Pylori negative; Dr. Allen Norris    Current Outpatient Medications  Medication Sig Dispense Refill   amLODipine (NORVASC) 5 MG tablet Take 2 tablets (10 mg total) by mouth daily. 90 tablet 0   aspirin EC 81 MG EC tablet Take 1 tablet (81 mg total) by mouth daily.     buPROPion (WELLBUTRIN) 75 MG tablet Take 2 tablets (150 mg total) by mouth daily. 180 tablet 0  butalbital-acetaminophen-caffeine (FIORICET, ESGIC) 50-325-40 MG tablet Take 1-2 tablets by mouth every 4 (four) hours as needed for headache. 150 tablet 3   carvedilol (COREG) 25 MG tablet TAKE 1 TABLET(25 MG) BY MOUTH TWICE DAILY WITH A MEAL 180 tablet 1   cloNIDine (CATAPRES) 0.3 MG tablet TAKE 1 TABLET(0.3 MG) BY MOUTH EVERY 8 HOURS 270 tablet 4   hydrALAZINE (APRESOLINE) 100 MG tablet Take 1 tablet (100 mg total) by mouth every 8 (eight) hours as needed. 270  tablet 1   LORazepam (ATIVAN) 1 MG tablet Take 1 tablet (1 mg total) by mouth 2 (two) times daily as needed. 180 tablet 1   quinapril (ACCUPRIL) 5 MG tablet Take 10 mg by mouth daily.      torsemide (DEMADEX) 20 MG tablet Take 40 mg by mouth every morning.     valACYclovir (VALTREX) 1000 MG tablet TAKE 2 TABLETS BY MOUTH TWICE DAILY FOR 1 DAY AS NEEDED FOR COLD SORES 20 tablet 1   zolpidem (AMBIEN) 10 MG tablet TAKE 1 TABLET BY MOUTH AT BEDTIME AS NEEDED. 30 tablet 3   No current facility-administered medications for this visit.      Allergies:   Cephalosporins; Clarithromycin; Lunesta  [eszopiclone]; Penicillin v; and Sulfa antibiotics   Social History:  The patient  reports that she has never smoked. She has never used smokeless tobacco. She reports current alcohol use. She reports that she does not use drugs.   Family History:   family history includes Cancer in her paternal grandfather; Congestive Heart Failure in her maternal grandmother; Heart disease in her maternal uncle; Pancreatic cancer in her father.    Review of Systems: Review of Systems  Constitutional: Negative.   Eyes: Negative.   Respiratory: Negative.   Cardiovascular: Negative.   Gastrointestinal: Negative.   Genitourinary: Negative.   Musculoskeletal: Negative.   Neurological: Negative.   Psychiatric/Behavioral: Negative.   All other systems reviewed and are negative.    PHYSICAL EXAM: VS:  There were no vitals taken for this visit. , BMI There is no height or weight on file to calculate BMI.  Constitutional:  oriented to person, place, and time. No distress.  HENT:  Head: Grossly normal Eyes:  no discharge. No scleral icterus.  Neck: No JVD, no carotid bruits  Cardiovascular: Regular rate and rhythm, no murmurs appreciated Pulmonary/Chest: Clear to auscultation bilaterally, no wheezes or rails Abdominal: Soft.  no distension.  no tenderness.  Musculoskeletal: Normal range of motion Neurological:   normal muscle tone. Coordination normal. No atrophy Skin: Skin warm and dry Psychiatric: normal affect, pleasant    Recent Labs: 06/19/2018: ALT 14 06/22/2018: BUN 68; Creatinine, Ser 4.45; Hemoglobin 8.9; Platelets 277; Potassium 4.4; Sodium 136    Lipid Panel Lab Results  Component Value Date   CHOL 165 09/24/2016   HDL 62 09/24/2016   LDLCALC 78 09/24/2016   TRIG 126 09/24/2016      Wt Readings from Last 3 Encounters:  06/27/18 110 lb (49.9 kg)  06/19/18 117 lb (53.1 kg)  06/12/18 117 lb (53.1 kg)      ASSESSMENT AND PLAN:   Chronic kidney disease (CKD), active medical management without dialysis, stage 3 (moderate) - Followed by  Dr. Fredna Dow history of poorly controlled hypertension Recent overuse of Lasix, climbing creatinine from 2 up to 3 Lower extremity edema likely secondary to underlying anemia  Lower extremity edema Recommend she hold amlodipine Compression hose  Essential hypertension - Plan: EKG 12-Lead We will hold amlodipine as  above, increase clonidine up to 0.2 mg twice daily Recommend she monitor blood pressure closely at home Elevated on today's visit and on other office visits per the patient If blood pressure continues to run high could increase hydralazine up to 75 mg 3 times daily She currently takes 50 mg 3 times daily  Chronic gastritis without bleeding, unspecified gastritis type - Plan: EKG 12-Lead Currently on proton pump inhibitor and H2 blocker  Anemia in stage 3 chronic kidney disease She received Epogen Hemoglobin 7-8 likely contributing to lower extremity edema  Long discussion with her concerning each of her medical problems Discussed anemia, relationship with leg swelling Discussed renal failure, importance of blood pressure control Discussed various treatment options for her blood pressure,  Side effects from amlodipine    Total encounter time more than *** minutes  Greater than 50% was spent in counseling and  coordination of care with the patient   Disposition:   F/U *** months   No orders of the defined types were placed in this encounter.   Yetta Glassman am acting as a Education administrator for Ida Rogue, M.D., Ph.D.  {Add scribe attestation statement}  Signed, Esmond Plants, M.D., Ph.D. 07/18/2018  [place], [place]

## 2018-07-25 ENCOUNTER — Other Ambulatory Visit: Payer: Self-pay | Admitting: Family Medicine

## 2018-07-25 DIAGNOSIS — G8929 Other chronic pain: Secondary | ICD-10-CM

## 2018-07-25 DIAGNOSIS — R51 Headache: Principal | ICD-10-CM

## 2018-07-26 NOTE — Telephone Encounter (Signed)
Patient prefers not to do an evisit at thime as she is not having any issues.  Patient will call if deciding this is needed.  Otherwise please call when scheduling in office routine visits.

## 2018-07-26 NOTE — Telephone Encounter (Signed)
Forwarding to scheduling.

## 2018-07-28 ENCOUNTER — Other Ambulatory Visit: Payer: Self-pay

## 2018-07-31 ENCOUNTER — Inpatient Hospital Stay: Payer: Medicare Other

## 2018-07-31 ENCOUNTER — Other Ambulatory Visit: Payer: Self-pay

## 2018-07-31 VITALS — BP 136/65 | HR 80

## 2018-07-31 DIAGNOSIS — D631 Anemia in chronic kidney disease: Secondary | ICD-10-CM | POA: Diagnosis not present

## 2018-07-31 DIAGNOSIS — D509 Iron deficiency anemia, unspecified: Secondary | ICD-10-CM

## 2018-07-31 DIAGNOSIS — N184 Chronic kidney disease, stage 4 (severe): Principal | ICD-10-CM

## 2018-07-31 LAB — CBC WITH DIFFERENTIAL/PLATELET
Abs Immature Granulocytes: 0.02 10*3/uL (ref 0.00–0.07)
Basophils Absolute: 0 10*3/uL (ref 0.0–0.1)
Basophils Relative: 1 %
Eosinophils Absolute: 0.3 10*3/uL (ref 0.0–0.5)
Eosinophils Relative: 5 %
HCT: 20.4 % — ABNORMAL LOW (ref 36.0–46.0)
Hemoglobin: 6.7 g/dL — ABNORMAL LOW (ref 12.0–15.0)
Immature Granulocytes: 0 %
Lymphocytes Relative: 15 %
Lymphs Abs: 0.8 10*3/uL (ref 0.7–4.0)
MCH: 30.3 pg (ref 26.0–34.0)
MCHC: 32.8 g/dL (ref 30.0–36.0)
MCV: 92.3 fL (ref 80.0–100.0)
MONO ABS: 0.5 10*3/uL (ref 0.1–1.0)
Monocytes Relative: 8 %
Neutro Abs: 4 10*3/uL (ref 1.7–7.7)
Neutrophils Relative %: 71 %
Platelets: 194 10*3/uL (ref 150–400)
RBC: 2.21 MIL/uL — ABNORMAL LOW (ref 3.87–5.11)
RDW: 15.2 % (ref 11.5–15.5)
WBC: 5.6 10*3/uL (ref 4.0–10.5)
nRBC: 0 % (ref 0.0–0.2)

## 2018-07-31 LAB — SAMPLE TO BLOOD BANK

## 2018-07-31 MED ORDER — EPOETIN ALFA 40000 UNIT/ML IJ SOLN
40000.0000 [IU] | Freq: Once | INTRAMUSCULAR | Status: AC
  Administered 2018-07-31: 40000 [IU] via SUBCUTANEOUS

## 2018-08-02 ENCOUNTER — Other Ambulatory Visit: Payer: Self-pay

## 2018-08-02 ENCOUNTER — Other Ambulatory Visit: Payer: Self-pay | Admitting: Oncology

## 2018-08-02 DIAGNOSIS — D649 Anemia, unspecified: Secondary | ICD-10-CM

## 2018-08-02 LAB — PREPARE RBC (CROSSMATCH)

## 2018-08-03 ENCOUNTER — Other Ambulatory Visit: Payer: Self-pay

## 2018-08-03 ENCOUNTER — Inpatient Hospital Stay: Payer: Medicare Other | Attending: Oncology

## 2018-08-03 DIAGNOSIS — D631 Anemia in chronic kidney disease: Secondary | ICD-10-CM | POA: Insufficient documentation

## 2018-08-03 DIAGNOSIS — D649 Anemia, unspecified: Secondary | ICD-10-CM

## 2018-08-03 DIAGNOSIS — N184 Chronic kidney disease, stage 4 (severe): Secondary | ICD-10-CM | POA: Diagnosis not present

## 2018-08-03 MED ORDER — ACETAMINOPHEN 325 MG PO TABS
650.0000 mg | ORAL_TABLET | Freq: Once | ORAL | Status: AC
  Administered 2018-08-03: 650 mg via ORAL

## 2018-08-03 MED ORDER — ACETAMINOPHEN 325 MG PO TABS
ORAL_TABLET | ORAL | Status: AC
  Filled 2018-08-03: qty 2

## 2018-08-03 MED ORDER — DIPHENHYDRAMINE HCL 50 MG/ML IJ SOLN
INTRAMUSCULAR | Status: AC
  Filled 2018-08-03: qty 1

## 2018-08-03 MED ORDER — SODIUM CHLORIDE 0.9% IV SOLUTION
250.0000 mL | Freq: Once | INTRAVENOUS | Status: AC
  Administered 2018-08-03: 250 mL via INTRAVENOUS
  Filled 2018-08-03: qty 250

## 2018-08-03 MED ORDER — DIPHENHYDRAMINE HCL 50 MG/ML IJ SOLN
25.0000 mg | Freq: Once | INTRAMUSCULAR | Status: AC
  Administered 2018-08-03: 25 mg via INTRAVENOUS

## 2018-08-04 LAB — BPAM RBC
Blood Product Expiration Date: 202004022359
ISSUE DATE / TIME: 202004021341
Unit Type and Rh: 9500

## 2018-08-04 LAB — TYPE AND SCREEN
ABO/RH(D): O POS
Antibody Screen: NEGATIVE
Unit division: 0

## 2018-08-17 ENCOUNTER — Other Ambulatory Visit: Payer: Self-pay | Admitting: Family Medicine

## 2018-08-17 DIAGNOSIS — G8929 Other chronic pain: Secondary | ICD-10-CM

## 2018-08-17 DIAGNOSIS — R51 Headache: Principal | ICD-10-CM

## 2018-08-21 ENCOUNTER — Inpatient Hospital Stay: Payer: Medicare Other

## 2018-08-21 ENCOUNTER — Other Ambulatory Visit: Payer: Self-pay | Admitting: Family Medicine

## 2018-08-21 ENCOUNTER — Other Ambulatory Visit: Payer: Self-pay

## 2018-08-21 ENCOUNTER — Ambulatory Visit: Payer: Medicare Other

## 2018-08-21 ENCOUNTER — Ambulatory Visit: Payer: Medicare Other | Admitting: Oncology

## 2018-08-21 ENCOUNTER — Other Ambulatory Visit: Payer: Medicare Other

## 2018-08-21 VITALS — BP 108/59 | HR 69

## 2018-08-21 DIAGNOSIS — D631 Anemia in chronic kidney disease: Secondary | ICD-10-CM | POA: Diagnosis not present

## 2018-08-21 DIAGNOSIS — N184 Chronic kidney disease, stage 4 (severe): Principal | ICD-10-CM

## 2018-08-21 DIAGNOSIS — D509 Iron deficiency anemia, unspecified: Secondary | ICD-10-CM

## 2018-08-21 LAB — CBC WITH DIFFERENTIAL/PLATELET
Abs Immature Granulocytes: 0.01 10*3/uL (ref 0.00–0.07)
Basophils Absolute: 0.1 10*3/uL (ref 0.0–0.1)
Basophils Relative: 2 %
Eosinophils Absolute: 0.4 10*3/uL (ref 0.0–0.5)
Eosinophils Relative: 9 %
HCT: 28 % — ABNORMAL LOW (ref 36.0–46.0)
Hemoglobin: 9 g/dL — ABNORMAL LOW (ref 12.0–15.0)
Immature Granulocytes: 0 %
Lymphocytes Relative: 17 %
Lymphs Abs: 0.8 10*3/uL (ref 0.7–4.0)
MCH: 31 pg (ref 26.0–34.0)
MCHC: 32.1 g/dL (ref 30.0–36.0)
MCV: 96.6 fL (ref 80.0–100.0)
Monocytes Absolute: 0.3 10*3/uL (ref 0.1–1.0)
Monocytes Relative: 7 %
Neutro Abs: 2.9 10*3/uL (ref 1.7–7.7)
Neutrophils Relative %: 65 %
Platelets: 188 10*3/uL (ref 150–400)
RBC: 2.9 MIL/uL — ABNORMAL LOW (ref 3.87–5.11)
RDW: 16.2 % — ABNORMAL HIGH (ref 11.5–15.5)
WBC: 4.4 10*3/uL (ref 4.0–10.5)
nRBC: 0 % (ref 0.0–0.2)

## 2018-08-21 MED ORDER — EPOETIN ALFA 40000 UNIT/ML IJ SOLN
40000.0000 [IU] | Freq: Once | INTRAMUSCULAR | Status: AC
  Administered 2018-08-21: 40000 [IU] via SUBCUTANEOUS

## 2018-08-29 ENCOUNTER — Other Ambulatory Visit: Payer: Self-pay | Admitting: Family Medicine

## 2018-08-29 DIAGNOSIS — G4700 Insomnia, unspecified: Secondary | ICD-10-CM

## 2018-08-31 ENCOUNTER — Other Ambulatory Visit: Payer: Self-pay

## 2018-08-31 ENCOUNTER — Encounter: Payer: Self-pay | Admitting: Physician Assistant

## 2018-08-31 ENCOUNTER — Ambulatory Visit (INDEPENDENT_AMBULATORY_CARE_PROVIDER_SITE_OTHER): Payer: Medicare Other | Admitting: Physician Assistant

## 2018-08-31 VITALS — BP 117/66 | HR 72 | Temp 98.6°F | Resp 16 | Wt 109.0 lb

## 2018-08-31 DIAGNOSIS — J0140 Acute pansinusitis, unspecified: Secondary | ICD-10-CM | POA: Diagnosis not present

## 2018-08-31 DIAGNOSIS — N3000 Acute cystitis without hematuria: Secondary | ICD-10-CM

## 2018-08-31 DIAGNOSIS — R300 Dysuria: Secondary | ICD-10-CM

## 2018-08-31 LAB — POCT URINALYSIS DIPSTICK
Bilirubin, UA: NEGATIVE
Glucose, UA: NEGATIVE
Ketones, UA: NEGATIVE
Nitrite, UA: NEGATIVE
Protein, UA: POSITIVE — AB
Spec Grav, UA: 1.02 (ref 1.010–1.025)
Urobilinogen, UA: 0.2 E.U./dL
pH, UA: 6 (ref 5.0–8.0)

## 2018-08-31 MED ORDER — DOXYCYCLINE HYCLATE 100 MG PO TABS: 100.0000 mg | ORAL_TABLET | Freq: Two times a day (BID) | ORAL | 0 refills | Status: DC

## 2018-08-31 NOTE — Progress Notes (Signed)
Patient: Amy [name] Female    DOB: 1950/12/09   68 y.o.   MRN: [id number] Visit Date: 08/31/2018  Today's Provider: Mar Daring, PA-C   Chief Complaint  Patient presents with  . Dysuria    x 1 day   Subjective:     Dysuria   This is a new problem. The current episode started yesterday. The problem has been gradually worsening. The quality of the pain is described as burning. Associated symptoms include chills, frequency, nausea and urgency. Pertinent negatives include no hematuria, possible pregnancy or sweats. Associated symptoms comments: Cloudy urine. The treatment provided no relief.   She also reports having symptoms of a sinus infection. She is having sinus pressure, post nasal drainage, headache. No fevers or cough.   Allergies  Allergen Reactions  . Cephalosporins   . Clarithromycin   . Lunesta  [Eszopiclone]     Heart racing  . Penicillin V     Has patient had a PCN reaction causing immediate rash, facial/tongue/throat swelling, SOB or lightheadedness with hypotension: Yes Has patient had a PCN reaction causing severe rash involving mucus membranes or skin necrosis: No Has patient had a PCN reaction that required hospitalization No Has patient had a PCN reaction occurring within the last 10 years: No If all of the above answers are "NO", then may proceed with Cephalosporin use.  . Sulfa Antibiotics      Current Outpatient Medications:  .  amLODipine (NORVASC) 5 MG tablet, Take 2 tablets (10 mg total) by mouth daily., Disp: 90 tablet, Rfl: 0 .  aspirin EC 81 MG EC tablet, Take 1 tablet (81 mg total) by mouth daily., Disp: , Rfl:  .  buPROPion (WELLBUTRIN) 75 MG tablet, Take 1 tablet (75 mg total) by mouth 2 (two) times daily., Disp: 180 tablet, Rfl: 1 .  butalbital-acetaminophen-caffeine (FIORICET) 50-325-40 MG tablet, TAKE 1 TO 2 TABLETS BY MOUTH EVERY 4 HOURS AS NEEDED FOR HEADACHE, Disp: 150 tablet, Rfl: 2 .  carvedilol (COREG) 25 MG tablet, TAKE  1 TABLET(25 MG) BY MOUTH TWICE DAILY WITH A MEAL, Disp: 180 tablet, Rfl: 1 .  cloNIDine (CATAPRES) 0.3 MG tablet, TAKE 1 TABLET(0.3 MG) BY MOUTH EVERY 8 HOURS, Disp: 270 tablet, Rfl: 4 .  hydrALAZINE (APRESOLINE) 100 MG tablet, Take 1 tablet (100 mg total) by mouth every 8 (eight) hours as needed., Disp: 270 tablet, Rfl: 1 .  LORazepam (ATIVAN) 1 MG tablet, Take 1 tablet (1 mg total) by mouth 2 (two) times daily as needed., Disp: 180 tablet, Rfl: 1 .  quinapril (ACCUPRIL) 5 MG tablet, Take 10 mg by mouth daily. , Disp: , Rfl:  .  torsemide (DEMADEX) 20 MG tablet, Take 40 mg by mouth every morning., Disp: , Rfl:  .  valACYclovir (VALTREX) 1000 MG tablet, TAKE 2 TABLETS BY MOUTH TWICE DAILY FOR 1 DAY AS NEEDED FOR COLD SORES, Disp: 20 tablet, Rfl: 1 .  zolpidem (AMBIEN) 10 MG tablet, Take 0.5-1 tablets (5-10 mg total) by mouth at bedtime as needed., Disp: 30 tablet, Rfl: 1  Review of Systems  Constitutional: Positive for chills. Negative for fever.  HENT: Positive for congestion, postnasal drip and sinus pain. Negative for ear pain, sneezing and sore throat.   Respiratory: Negative.   Cardiovascular: Negative.   Gastrointestinal: Positive for abdominal pain and nausea.  Genitourinary: Positive for dysuria, frequency and urgency. Negative for hematuria.  Neurological: Positive for headaches. Negative for dizziness.    Social History  Tobacco Use  . Smoking status: Never Smoker  . Smokeless tobacco: Never Used  Substance Use Topics  . Alcohol use: Yes    Alcohol/week: 0.0 - 2.0 standard drinks      Objective:   BP 117/66 (BP Location: Left Arm, Patient Position: Sitting, Cuff Size: Normal)   Pulse 72   Temp 98.6 F (37 C) (Oral)   Resp 16   Wt 109 lb (49.4 kg)   SpO2 98% Comment: room air  BMI 18.71 kg/m  Vitals:   08/31/18 1119  BP: 117/66  Pulse: 72  Resp: 16  Temp: 98.6 F (37 C)  TempSrc: Oral  SpO2: 98%  Weight: 109 lb (49.4 kg)     Physical Exam Vitals signs  reviewed.  Constitutional:      General: She is not in acute distress.    Appearance: Normal appearance. She is well-developed and normal weight. She is not ill-appearing or diaphoretic.  HENT:     Head: Normocephalic and atraumatic.     Right Ear: Hearing, tympanic membrane, ear canal and external ear normal.     Left Ear: Hearing, tympanic membrane, ear canal and external ear normal.     Nose: Mucosal edema and congestion present. No rhinorrhea.     Right Sinus: Maxillary sinus tenderness and frontal sinus tenderness present.     Left Sinus: Maxillary sinus tenderness and frontal sinus tenderness present.     Mouth/Throat:     Mouth: Mucous membranes are moist.     Pharynx: Uvula midline. Posterior oropharyngeal erythema present. No oropharyngeal exudate.  Eyes:     General: No scleral icterus.    Extraocular Movements: Extraocular movements intact.     Pupils: Pupils are equal, round, and reactive to light.  Neck:     Musculoskeletal: Normal range of motion and neck supple.     Thyroid: No thyromegaly.     Trachea: No tracheal deviation.  Cardiovascular:     Rate and Rhythm: Normal rate and regular rhythm.     Heart sounds: Normal heart sounds. No murmur. No friction rub. No gallop.   Pulmonary:     Effort: Pulmonary effort is normal. No respiratory distress.     Breath sounds: Normal breath sounds. No stridor. No wheezing or rales.  Abdominal:     General: Abdomen is flat. Bowel sounds are normal. There is no distension.     Palpations: Abdomen is soft.     Tenderness: There is abdominal tenderness in the suprapubic area. There is no right CVA tenderness or left CVA tenderness.  Lymphadenopathy:     Cervical: No cervical adenopathy.  Neurological:     Mental Status: She is alert.         Assessment & Plan    1. Dysuria UA positive. - POCT Urinalysis Dipstick - Urine Culture  2. Acute cystitis without hematuria Worsening symptoms. UA positive. Will treat  empirically with Doxycycline as below.  Continue to push fluids. Urine sent for culture. Will follow up pending C&S results. She is to call if symptoms do not improve or if they worsen.  - doxycycline (VIBRA-TABS) 100 MG tablet; Take 1 tablet (100 mg total) by mouth 2 (two) times daily.  Dispense: 20 tablet; Refill: 0  3. Acute non-recurrent pansinusitis Worsening symptoms that have not responded to OTC medications. Will give Doxycycline as below. Continue allergy medications. Stay well hydrated and get plenty of rest. Call if no symptom improvement or if symptoms worsen. - doxycycline (VIBRA-TABS) 100 MG tablet;  Take 1 tablet (100 mg total) by mouth 2 (two) times daily.  Dispense: 20 tablet; Refill: 0     Mar Daring, PA-C  [facility]

## 2018-08-31 NOTE — Patient Instructions (Signed)
Urinary Tract Infection, Adult A urinary tract infection (UTI) is an infection of any part of the urinary tract. The urinary tract includes:  The kidneys.  The ureters.  The bladder.  The urethra. These organs make, store, and get rid of pee (urine) in the body. What are the causes? This is caused by germs (bacteria) in your genital area. These germs grow and cause swelling (inflammation) of your urinary tract. What increases the risk? You are more likely to develop this condition if:  You have a small, thin tube (catheter) to drain pee.  You cannot control when you pee or poop (incontinence).  You are female, and: ? You use these methods to prevent pregnancy: ? A medicine that kills sperm (spermicide). ? A device that blocks sperm (diaphragm). ? You have low levels of a female hormone (estrogen). ? You are pregnant.  You have genes that add to your risk.  You are sexually active.  You take antibiotic medicines.  You have trouble peeing because of: ? A prostate that is bigger than normal, if you are female. ? A blockage in the part of your body that drains pee from the bladder (urethra). ? A kidney stone. ? A nerve condition that affects your bladder (neurogenic bladder). ? Not getting enough to drink. ? Not peeing often enough.  You have other conditions, such as: ? Diabetes. ? A weak disease-fighting system (immune system). ? Sickle cell disease. ? Gout. ? Injury of the spine. What are the signs or symptoms? Symptoms of this condition include:  Needing to pee right away (urgently).  Peeing often.  Peeing small amounts often.  Pain or burning when peeing.  Blood in the pee.  Pee that smells bad or not like normal.  Trouble peeing.  Pee that is cloudy.  Fluid coming from the vagina, if you are female.  Pain in the belly or lower back. Other symptoms include:  Throwing up (vomiting).  No urge to eat.  Feeling mixed up (confused).  Being tired  and grouchy (irritable).  A fever.  Watery poop (diarrhea). How is this treated? This condition may be treated with:  Antibiotic medicine.  Other medicines.  Drinking enough water. Follow these instructions at home:  Medicines  Take over-the-counter and prescription medicines only as told by your doctor.  If you were prescribed an antibiotic medicine, take it as told by your doctor. Do not stop taking it even if you start to feel better. General instructions  Make sure you: ? Pee until your bladder is empty. ? Do not hold pee for a long time. ? Empty your bladder after sex. ? Wipe from front to back after pooping if you are a female. Use each tissue one time when you wipe.  Drink enough fluid to keep your pee pale yellow.  Keep all follow-up visits as told by your doctor. This is important. Contact a doctor if:  You do not get better after 1-2 days.  Your symptoms go away and then come back. Get help right away if:  You have very bad back pain.  You have very bad pain in your lower belly.  You have a fever.  You are sick to your stomach (nauseous).  You are throwing up. Summary  A urinary tract infection (UTI) is an infection of any part of the urinary tract.  This condition is caused by germs in your genital area.  There are many risk factors for a UTI. These include having a small, thin   tube to drain pee and not being able to control when you pee or poop.  Treatment includes antibiotic medicines for germs.  Drink enough fluid to keep your pee pale yellow. This information is not intended to replace advice given to you by your health care provider. Make sure you discuss any questions you have with your health care provider. Document Released: 10/06/2007 Document Revised: 10/27/2017 Document Reviewed: 10/27/2017 Elsevier Interactive Patient Education  2019 Elsevier Inc.  

## 2018-09-02 LAB — URINE CULTURE

## 2018-09-04 ENCOUNTER — Telehealth: Payer: Self-pay | Admitting: *Deleted

## 2018-09-04 DIAGNOSIS — N3000 Acute cystitis without hematuria: Secondary | ICD-10-CM

## 2018-09-04 NOTE — Telephone Encounter (Signed)
LMOVM for pt to return call 

## 2018-09-04 NOTE — Telephone Encounter (Signed)
-----   Message from Mar Daring, PA-C sent at 09/04/2018  8:14 AM EDT ----- Urine culture positive for e.coli. It is sensitive to the antibiotic you were placed on. Continue until completed.

## 2018-09-06 MED ORDER — CEPHALEXIN 500 MG PO CAPS: 500.0000 mg | ORAL_CAPSULE | Freq: Two times a day (BID) | ORAL | 0 refills | Status: DC

## 2018-09-06 NOTE — Telephone Encounter (Signed)
Keflex sent in.

## 2018-09-06 NOTE — Telephone Encounter (Signed)
Patient advised as directed below. Per patient she has improved some but still feels like she has UTI and would like another antibiotic to be send.

## 2018-09-06 NOTE — Telephone Encounter (Signed)
Spoke to pt and advised results.  Pt advised that the medication given doxycyline is making her really sick to her stomach.  She asked if there is anything else she can take different.  Please call back to let her know.  Call back # [phone]

## 2018-09-06 NOTE — Telephone Encounter (Signed)
Since she has had 6 days of antibiotic she can stop the doxycycline if UTI symptoms have improved.

## 2018-09-06 NOTE — Addendum Note (Signed)
Addended by: Mar Daring on: 09/06/2018 11:33 AM   Modules accepted: Orders

## 2018-09-07 ENCOUNTER — Telehealth: Payer: Self-pay

## 2018-09-07 DIAGNOSIS — N3000 Acute cystitis without hematuria: Secondary | ICD-10-CM

## 2018-09-07 MED ORDER — AMOXICILLIN-POT CLAVULANATE 875-125 MG PO TABS: 1.0000 | ORAL_TABLET | Freq: Two times a day (BID) | ORAL | 0 refills | Status: DC

## 2018-09-07 NOTE — Telephone Encounter (Signed)
Patient states she is not able to take Keflex that was sent in for UTI. She has a allergy. She is requesting Augmentin or Erythromycin be sent to  Pharmacy-[facility] CB#[phone]

## 2018-09-07 NOTE — Telephone Encounter (Signed)
Augmentin sent in.  

## 2018-09-10 ENCOUNTER — Other Ambulatory Visit: Payer: Self-pay

## 2018-09-11 ENCOUNTER — Other Ambulatory Visit: Payer: Self-pay

## 2018-09-11 ENCOUNTER — Inpatient Hospital Stay: Payer: Medicare Other | Attending: Oncology

## 2018-09-11 ENCOUNTER — Other Ambulatory Visit: Payer: Medicare Other

## 2018-09-11 ENCOUNTER — Ambulatory Visit: Payer: Medicare Other | Admitting: Oncology

## 2018-09-11 ENCOUNTER — Ambulatory Visit: Payer: Medicare Other

## 2018-09-11 ENCOUNTER — Inpatient Hospital Stay (HOSPITAL_BASED_OUTPATIENT_CLINIC_OR_DEPARTMENT_OTHER): Payer: Medicare Other | Admitting: Oncology

## 2018-09-11 ENCOUNTER — Encounter: Payer: Self-pay | Admitting: Oncology

## 2018-09-11 ENCOUNTER — Encounter (INDEPENDENT_AMBULATORY_CARE_PROVIDER_SITE_OTHER): Payer: Self-pay

## 2018-09-11 ENCOUNTER — Inpatient Hospital Stay: Payer: Medicare Other

## 2018-09-11 VITALS — BP 129/65 | HR 71 | Temp 96.0°F | Resp 18 | Wt 109.6 lb

## 2018-09-11 DIAGNOSIS — D509 Iron deficiency anemia, unspecified: Secondary | ICD-10-CM

## 2018-09-11 DIAGNOSIS — D631 Anemia in chronic kidney disease: Secondary | ICD-10-CM | POA: Diagnosis not present

## 2018-09-11 DIAGNOSIS — N184 Chronic kidney disease, stage 4 (severe): Secondary | ICD-10-CM | POA: Diagnosis not present

## 2018-09-11 DIAGNOSIS — I129 Hypertensive chronic kidney disease with stage 1 through stage 4 chronic kidney disease, or unspecified chronic kidney disease: Secondary | ICD-10-CM | POA: Insufficient documentation

## 2018-09-11 DIAGNOSIS — D649 Anemia, unspecified: Secondary | ICD-10-CM

## 2018-09-11 DIAGNOSIS — Z79899 Other long term (current) drug therapy: Secondary | ICD-10-CM | POA: Diagnosis not present

## 2018-09-11 LAB — CBC WITH DIFFERENTIAL/PLATELET
Abs Immature Granulocytes: 0.03 10*3/uL (ref 0.00–0.07)
Basophils Absolute: 0.1 10*3/uL (ref 0.0–0.1)
Basophils Relative: 1 %
Eosinophils Absolute: 0.2 10*3/uL (ref 0.0–0.5)
Eosinophils Relative: 3 %
HCT: 31.9 % — ABNORMAL LOW (ref 36.0–46.0)
Hemoglobin: 10 g/dL — ABNORMAL LOW (ref 12.0–15.0)
Immature Granulocytes: 0 %
Lymphocytes Relative: 13 %
Lymphs Abs: 1 10*3/uL (ref 0.7–4.0)
MCH: 29.8 pg (ref 26.0–34.0)
MCHC: 31.3 g/dL (ref 30.0–36.0)
MCV: 94.9 fL (ref 80.0–100.0)
Monocytes Absolute: 0.4 10*3/uL (ref 0.1–1.0)
Monocytes Relative: 6 %
Neutro Abs: 5.6 10*3/uL (ref 1.7–7.7)
Neutrophils Relative %: 77 %
Platelets: 162 10*3/uL (ref 150–400)
RBC: 3.36 MIL/uL — ABNORMAL LOW (ref 3.87–5.11)
RDW: 15.3 % (ref 11.5–15.5)
WBC: 7.3 10*3/uL (ref 4.0–10.5)
nRBC: 0 % (ref 0.0–0.2)

## 2018-09-11 MED ORDER — EPOETIN ALFA 40000 UNIT/ML IJ SOLN
40000.0000 [IU] | Freq: Once | INTRAMUSCULAR | Status: AC
  Administered 2018-09-11: 40000 [IU] via SUBCUTANEOUS
  Filled 2018-09-11: qty 1

## 2018-09-11 NOTE — Progress Notes (Signed)
Patient here for follow up. States she feels tired.

## 2018-09-13 NOTE — Progress Notes (Signed)
Hematology/Oncology Consult note [facility]  [phone] [phone]  Patient Care Team: [name], MD as PCP - General (Family Medicine) Thornton Park, MD as Referring Physician (Orthopedic Surgery) Lloyd Huger, MD as Consulting Physician (Oncology) Murlean Iba, MD as Referring Physician (Nephrology)   Name of the patient: Amy [name]  [id number]  09-29-1950   Date of visit: 09/13/18  Diagnosis-anemia of chronic kidney disease  Chief complaint/ Reason for visit-routine follow-up of anemia of chronic kidney disease  Heme/Onc history: Patient is a 68 year old female who sees Dr. Grayland Ormond as an outpatient.  She has a history of stage IV CKD and anemia secondary to it.  She has been receiving Procrit every 3 weeks.  Interval history-feels fatigued but denies any other new complaints.  Appetite and weight are stable.  Denies any blood in her stool or urine.  ECOG PS- 1 Pain scale- 0   Review of systems- Review of Systems  Constitutional: Positive for malaise/fatigue. Negative for chills, fever and weight loss.  HENT: Negative for congestion, ear discharge and nosebleeds.   Eyes: Negative for blurred vision.  Respiratory: Negative for cough, hemoptysis, sputum production, shortness of breath and wheezing.   Cardiovascular: Negative for chest pain, palpitations, orthopnea and claudication.  Gastrointestinal: Negative for abdominal pain, blood in stool, constipation, diarrhea, heartburn, melena, nausea and vomiting.  Genitourinary: Negative for dysuria, flank pain, frequency, hematuria and urgency.  Musculoskeletal: Negative for back pain, joint pain and myalgias.  Skin: Negative for rash.  Neurological: Negative for dizziness, tingling, focal weakness, seizures, weakness and headaches.  Endo/Heme/Allergies: Does not bruise/bleed easily.  Psychiatric/Behavioral: Negative for depression and suicidal ideas. The patient  does not have insomnia.        Allergies  Allergen Reactions  . Cephalosporins   . Clarithromycin   . Lunesta  [Eszopiclone]     Heart racing  . Penicillin V     Has patient had a PCN reaction causing immediate rash, facial/tongue/throat swelling, SOB or lightheadedness with hypotension: Yes Has patient had a PCN reaction causing severe rash involving mucus membranes or skin necrosis: No Has patient had a PCN reaction that required hospitalization No Has patient had a PCN reaction occurring within the last 10 years: No If all of the above answers are "NO", then may proceed with Cephalosporin use.  . Sulfa Antibiotics      Past Medical History:  Diagnosis Date  . Anemia    a. 08/2016 Guaiac + stool. EGD w/ gastritis.  . Anxiety   . CKD (chronic kidney disease), stage III ([place])   . Colon polyp    a. 08/2016 Colonoscopy.  . Gastritis    a. 08/2016 EGD: Gastritis-->PPI.  [name] GERD (gastroesophageal reflux disease)   . History of chicken pox   . History of measles as a child   . Hypertension   . Hyponatremia    a. 08/2016 in setting of HCTZ Rx.  . Multiple thyroid nodules      Past Surgical History:  Procedure Laterality Date  . ABDOMINAL HYSTERECTOMY  2003   due to heavy periods and clotting during menses  . BREAST LUMPECTOMY Left 1990  . CHOLECYSTECTOMY  2010  . COLONOSCOPY WITH PROPOFOL N/A 09/07/2016   Procedure: COLONOSCOPY WITH PROPOFOL;  Surgeon: Lucilla Lame, MD;  Location: [facility] ENDOSCOPY;  Service: Endoscopy;  Laterality: N/A;  . DILATION AND CURETTAGE OF UTERUS  1985  . ESOPHAGOGASTRODUODENOSCOPY (EGD) WITH PROPOFOL N/A 09/07/2016   Procedure: ESOPHAGOGASTRODUODENOSCOPY (EGD) WITH  PROPOFOL;  Surgeon: Lucilla Lame, MD;  Location: [facility] ENDOSCOPY;  Service: Endoscopy;  Laterality: N/A;  . GALLBLADDER SURGERY  2012  . KNEE SURGERY     Dr. Tamala Julian  . MRI, Lumbar spine  07/23/2009   Multilevel annular bulge and disc protrusion at L2-L3, L3-L4, L4-L5, and L5-S1  .  TONSILLECTOMY    . TONSILLECTOMY AND ADENOIDECTOMY  1970  . UPPER GI ENDOSCOPY  03/02/2006   H. Pylori negative; Dr. Allen Norris    Social History   Socioeconomic History  . Marital status: Married    Spouse name: Not on file  . Number of children: 1  . Years of education: Not on file  . Highest education level: Bachelor's degree (e.g., BA, AB, BS)  Occupational History  . Occupation: Retired  Scientific laboratory technician  . Financial resource strain: Not hard at all  . Food insecurity:    Worry: Never true    Inability: Never true  . Transportation needs:    Medical: No    Non-medical: Patient refused  Tobacco Use  . Smoking status: Never Smoker  . Smokeless tobacco: Never Used  Substance and Sexual Activity  . Alcohol use: Yes    Alcohol/week: 0.0 - 2.0 standard drinks  . Drug use: No  . Sexual activity: Yes  Lifestyle  . Physical activity:    Days per week: Not on file    Minutes per session: Not on file  . Stress: Not at all  Relationships  . Social connections:    Talks on phone: Not on file    Gets together: Not on file    Attends religious service: Not on file    Active member of club or organization: Not on file    Attends meetings of clubs or organizations: Not on file    Relationship status: Not on file  . Intimate partner violence:    Fear of current or ex partner: Not on file    Emotionally abused: Not on file    Physically abused: Not on file    Forced sexual activity: Not on file  Other Topics Concern  . Not on file  Social History Narrative   Lives in [place] with husband.  Does not routinely exercise.    Family History  Problem Relation Age of Onset  . Pancreatic cancer Father   . Congestive Heart Failure Maternal Grandmother   . Cancer Paternal Grandfather   . Heart disease Maternal Uncle      Current Outpatient Medications:  .  amLODipine (NORVASC) 5 MG tablet, Take 2 tablets (10 mg total) by mouth daily., Disp: 90 tablet, Rfl: 0 .   amoxicillin-clavulanate (AUGMENTIN) 875-125 MG tablet, Take 1 tablet by mouth 2 (two) times daily., Disp: 20 tablet, Rfl: 0 .  aspirin EC 81 MG EC tablet, Take 1 tablet (81 mg total) by mouth daily., Disp: , Rfl:  .  buPROPion (WELLBUTRIN) 75 MG tablet, Take 1 tablet (75 mg total) by mouth 2 (two) times daily., Disp: 180 tablet, Rfl: 1 .  butalbital-acetaminophen-caffeine (FIORICET) 50-325-40 MG tablet, TAKE 1 TO 2 TABLETS BY MOUTH EVERY 4 HOURS AS NEEDED FOR HEADACHE, Disp: 150 tablet, Rfl: 2 .  carvedilol (COREG) 25 MG tablet, TAKE 1 TABLET(25 MG) BY MOUTH TWICE DAILY WITH A MEAL, Disp: 180 tablet, Rfl: 1 .  cloNIDine (CATAPRES) 0.3 MG tablet, TAKE 1 TABLET(0.3 MG) BY MOUTH EVERY 8 HOURS, Disp: 270 tablet, Rfl: 4 .  hydrALAZINE (APRESOLINE) 100 MG tablet, Take 1 tablet (100 mg total) by  mouth every 8 (eight) hours as needed., Disp: 270 tablet, Rfl: 1 .  LORazepam (ATIVAN) 1 MG tablet, Take 1 tablet (1 mg total) by mouth 2 (two) times daily as needed., Disp: 180 tablet, Rfl: 1 .  quinapril (ACCUPRIL) 5 MG tablet, Take 10 mg by mouth daily. , Disp: , Rfl:  .  torsemide (DEMADEX) 20 MG tablet, Take 40 mg by mouth every morning., Disp: , Rfl:  .  valACYclovir (VALTREX) 1000 MG tablet, TAKE 2 TABLETS BY MOUTH TWICE DAILY FOR 1 DAY AS NEEDED FOR COLD SORES, Disp: 20 tablet, Rfl: 1 .  zolpidem (AMBIEN) 10 MG tablet, Take 0.5-1 tablets (5-10 mg total) by mouth at bedtime as needed., Disp: 30 tablet, Rfl: 1  Physical exam:  Vitals:   09/11/18 1457  BP: 129/65  Pulse: 71  Resp: 18  Temp: (!) 96 F (35.6 C)  TempSrc: Tympanic  Weight: 109 lb 9.6 oz (49.7 kg)   Physical Exam Constitutional:      General: She is not in acute distress. HENT:     Head: Normocephalic and atraumatic.  Eyes:     Pupils: Pupils are equal, round, and reactive to light.  Neck:     Musculoskeletal: Normal range of motion.  Cardiovascular:     Rate and Rhythm: Normal rate and regular rhythm.     Heart sounds: Normal  heart sounds.  Pulmonary:     Effort: Pulmonary effort is normal.     Breath sounds: Normal breath sounds.  Abdominal:     General: Bowel sounds are normal.     Palpations: Abdomen is soft.  Skin:    General: Skin is warm and dry.  Neurological:     Mental Status: She is alert and oriented to person, place, and time.      CMP Latest Ref Rng & Units 06/22/2018  Glucose 70 - 99 mg/dL 107(H)  BUN 8 - 23 mg/dL 68(H)  Creatinine 0.44 - 1.00 mg/dL 4.45(H)  Sodium 135 - 145 mmol/L 136  Potassium 3.5 - 5.1 mmol/L 4.4  Chloride 98 - 111 mmol/L 113(H)  CO2 22 - 32 mmol/L 14(L)  Calcium 8.9 - 10.3 mg/dL 8.8(L)  Total Protein 6.5 - 8.1 g/dL -  Total Bilirubin 0.3 - 1.2 mg/dL -  Alkaline Phos 38 - 126 U/L -  AST 15 - 41 U/L -  ALT 0 - 44 U/L -   CBC Latest Ref Rng & Units 09/11/2018  WBC 4.0 - 10.5 K/uL 7.3  Hemoglobin 12.0 - 15.0 g/dL 10.0(L)  Hematocrit 36.0 - 46.0 % 31.9(L)  Platelets 150 - 400 K/uL 162     Assessment and plan- Patient is a 68 y.o. female with anemia of chronic kidney disease here for routine follow-up to receive Procrit  Her hemoglobin is 10 today and her blood pressure is within normal limits.  She will proceed with 40,000 units of Procrit today and continue to receive it every 3 weeks as long as hemoglobin is less than or equal to 10.  She will see Dr. Grayland Ormond back in 12 weeks with CBC with differential, CMP, ferritin and iron studies and B12   Visit Diagnosis 1. Anemia of chronic renal failure, unspecified CKD stage   2. Erythropoietin (EPO) stimulating agent anemia management patient      Dr. Randa Evens, MD, MPH [facility] [phone] 09/13/2018 8:23 AM

## 2018-09-14 ENCOUNTER — Other Ambulatory Visit: Payer: Self-pay | Admitting: Family Medicine

## 2018-09-29 ENCOUNTER — Other Ambulatory Visit: Payer: Self-pay

## 2018-10-02 ENCOUNTER — Inpatient Hospital Stay: Payer: Medicare Other | Attending: Oncology

## 2018-10-02 ENCOUNTER — Inpatient Hospital Stay: Payer: Medicare Other

## 2018-10-02 DIAGNOSIS — Z79899 Other long term (current) drug therapy: Secondary | ICD-10-CM | POA: Insufficient documentation

## 2018-10-02 DIAGNOSIS — N184 Chronic kidney disease, stage 4 (severe): Secondary | ICD-10-CM | POA: Insufficient documentation

## 2018-10-02 DIAGNOSIS — Z8249 Family history of ischemic heart disease and other diseases of the circulatory system: Secondary | ICD-10-CM | POA: Insufficient documentation

## 2018-10-02 DIAGNOSIS — Z809 Family history of malignant neoplasm, unspecified: Secondary | ICD-10-CM | POA: Insufficient documentation

## 2018-10-02 DIAGNOSIS — Z7289 Other problems related to lifestyle: Secondary | ICD-10-CM | POA: Insufficient documentation

## 2018-10-02 DIAGNOSIS — Z800 Family history of malignant neoplasm of digestive organs: Secondary | ICD-10-CM | POA: Insufficient documentation

## 2018-10-02 DIAGNOSIS — R5383 Other fatigue: Secondary | ICD-10-CM | POA: Insufficient documentation

## 2018-10-02 DIAGNOSIS — Z86010 Personal history of colonic polyps: Secondary | ICD-10-CM | POA: Insufficient documentation

## 2018-10-02 DIAGNOSIS — I129 Hypertensive chronic kidney disease with stage 1 through stage 4 chronic kidney disease, or unspecified chronic kidney disease: Secondary | ICD-10-CM | POA: Insufficient documentation

## 2018-10-02 DIAGNOSIS — D631 Anemia in chronic kidney disease: Secondary | ICD-10-CM | POA: Insufficient documentation

## 2018-10-06 ENCOUNTER — Ambulatory Visit (INDEPENDENT_AMBULATORY_CARE_PROVIDER_SITE_OTHER): Payer: Medicare Other | Admitting: Family Medicine

## 2018-10-06 ENCOUNTER — Other Ambulatory Visit: Payer: Self-pay

## 2018-10-06 ENCOUNTER — Encounter: Payer: Self-pay | Admitting: Family Medicine

## 2018-10-06 VITALS — BP 132/60 | HR 69 | Temp 97.8°F | Resp 16 | Wt 121.0 lb

## 2018-10-06 DIAGNOSIS — M7989 Other specified soft tissue disorders: Secondary | ICD-10-CM | POA: Diagnosis not present

## 2018-10-06 DIAGNOSIS — I150 Renovascular hypertension: Secondary | ICD-10-CM | POA: Diagnosis not present

## 2018-10-06 MED ORDER — HYDRALAZINE HCL 100 MG PO TABS: 100.0000 mg | ORAL_TABLET | Freq: Three times a day (TID) | ORAL | 2 refills | Status: DC | PRN

## 2018-10-06 MED ORDER — METOLAZONE 2.5 MG PO TABS: 2.5000 mg | ORAL_TABLET | Freq: Every day | ORAL | 1 refills | Status: DC | PRN

## 2018-10-06 NOTE — Progress Notes (Signed)
Patient: Amy [name] Female    DOB: [birth year]   68 y.o.   MRN: [id number] Visit Date: 10/06/2018  Today's Provider: Lelon Huh, MD   Chief Complaint  Patient presents with  . Leg Swelling   Subjective:     HPI Edema: Patient comes in for an evaluation of bilateral lower extremity edema. Symptoms started 3 weeks ago and seems to be worsening. Patient has also has some clear drainage from her legs. Patient denies any SOB or chest pain. Patient is currently taking Torsemide 40mg  daily. She has not seen her nephrologist since February due to Covid but states her BP has been well controlled. Amlodipine was doubled at that visit.  She did have GI sx with nausea and loss of appetite a few weeks ago around the time the swelling started.     Allergies  Allergen Reactions  . Cephalosporins   . Clarithromycin   . Lunesta  [Eszopiclone]     Heart racing  . Penicillin V     Has patient had a PCN reaction causing immediate rash, facial/tongue/throat swelling, SOB or lightheadedness with hypotension: Yes Has patient had a PCN reaction causing severe rash involving mucus membranes or skin necrosis: No Has patient had a PCN reaction that required hospitalization No Has patient had a PCN reaction occurring within the last 10 years: No If all of the above answers are "NO", then may proceed with Cephalosporin use.  . Sulfa Antibiotics      Current Outpatient Medications:  .  amLODipine (NORVASC) 5 MG tablet, Take 2 tablets (10 mg total) by mouth daily., Disp: 90 tablet, Rfl: 0 .  aspirin EC 81 MG EC tablet, Take 1 tablet (81 mg total) by mouth daily., Disp: , Rfl:  .  buPROPion (WELLBUTRIN) 75 MG tablet, Take 1 tablet (75 mg total) by mouth 2 (two) times daily., Disp: 180 tablet, Rfl: 1 .  butalbital-acetaminophen-caffeine (FIORICET) 50-325-40 MG tablet, TAKE 1 TO 2 TABLETS BY MOUTH EVERY 4 HOURS AS NEEDED FOR HEADACHE, Disp: 150 tablet, Rfl: 2 .  carvedilol (COREG) 25 MG tablet,  TAKE 1 TABLET(25 MG) BY MOUTH TWICE DAILY WITH A MEAL, Disp: 180 tablet, Rfl: 1 .  cloNIDine (CATAPRES) 0.3 MG tablet, TAKE 1 TABLET(0.3 MG) BY MOUTH EVERY 8 HOURS, Disp: 270 tablet, Rfl: 4 .  hydrALAZINE (APRESOLINE) 100 MG tablet, Take 1 tablet (100 mg total) by mouth every 8 (eight) hours as needed., Disp: 270 tablet, Rfl: 1 .  LORazepam (ATIVAN) 1 MG tablet, TAKE 1 TABLET(1 MG) BY MOUTH TWICE DAILY AS NEEDED, Disp: 180 tablet, Rfl: 1 .  quinapril (ACCUPRIL) 5 MG tablet, Take 10 mg by mouth daily. , Disp: , Rfl:  .  torsemide (DEMADEX) 20 MG tablet, Take 40 mg by mouth every morning., Disp: , Rfl:  .  valACYclovir (VALTREX) 1000 MG tablet, TAKE 2 TABLETS BY MOUTH TWICE DAILY FOR 1 DAY AS NEEDED FOR COLD SORES, Disp: 20 tablet, Rfl: 1 .  zolpidem (AMBIEN) 10 MG tablet, Take 0.5-1 tablets (5-10 mg total) by mouth at bedtime as needed., Disp: 30 tablet, Rfl: 1  Review of Systems  Constitutional: Negative for appetite change, chills, fatigue and fever.  Respiratory: Negative for chest tightness and shortness of breath.   Cardiovascular: Positive for leg swelling. Negative for chest pain and palpitations.  Gastrointestinal: Negative for abdominal pain, nausea and vomiting.  Neurological: Negative for dizziness and weakness.    Social History   Tobacco Use  . Smoking  status: Never Smoker  . Smokeless tobacco: Never Used  Substance Use Topics  . Alcohol use: Yes    Alcohol/week: 0.0 - 2.0 standard drinks   Wt Readings from Last 5 Encounters:  10/06/18 121 lb (54.9 kg)  09/11/18 109 lb 9.6 oz (49.7 kg)  08/31/18 109 lb (49.4 kg)  06/27/18 110 lb (49.9 kg)  06/19/18 117 lb (53.1 kg)      Objective:   BP 132/60 (BP Location: Left Arm, Patient Position: Sitting, Cuff Size: Normal)   Pulse 69   Temp 97.8 F (36.6 C) (Oral)   Resp 16   Wt 121 lb (54.9 kg)   SpO2 99% Comment: room air  BMI 20.77 kg/m  Vitals:   10/06/18 1530  BP: 132/60  Pulse: 69  Resp: 16  Temp: 97.8 F (36.6  C)  TempSrc: Oral  SpO2: 99%  Weight: 121 lb (54.9 kg)     Physical Exam   General Appearance:    Alert, cooperative, no distress  Eyes:    PERRL, conjunctiva/corneas clear, EOM's intact       Lungs:     Clear to auscultation bilaterally, respirations unlabored  Heart:    Regular rate and rhythm  Ext::   2+ bilateral lower leg edema with some mild blistering and weeping, no erythema.           Assessment & Plan    1. Leg swelling Maybe exacerbated by poor PO intake from recent GI bug versus worsening renal disease.  - metolazone (ZAROXOLYN) 2.5 MG tablet; Take 1 tablet (2.5 mg total) by mouth daily as needed (FOR SWELLING, NO MORE THAN 3 DAYS A WEEK).  Dispense: 20 tablet; Refill: 1 - CBC - Comprehensive metabolic panel  2. Renovascular hypertension refill- hydrALAZINE (APRESOLINE) 100 MG tablet; Take 1 tablet (100 mg total) by mouth every 8 (eight) hours as needed.  Dispense: 270 tablet; Refill: 2     Lelon Huh, MD  [facility]

## 2018-10-06 NOTE — Patient Instructions (Signed)
.   Please review the attached list of medications and notify my office if there are any errors.   . Please bring all of your medications to every appointment so we can make sure that our medication list is the same as yours.   

## 2018-10-07 LAB — COMPREHENSIVE METABOLIC PANEL
ALT: 11 IU/L (ref 0–32)
AST: 16 IU/L (ref 0–40)
Albumin/Globulin Ratio: 3.2 — ABNORMAL HIGH (ref 1.2–2.2)
Albumin: 4.1 g/dL (ref 3.8–4.8)
Alkaline Phosphatase: 137 IU/L — ABNORMAL HIGH (ref 39–117)
BUN/Creatinine Ratio: 21 (ref 12–28)
BUN: 90 mg/dL (ref 8–27)
Bilirubin Total: <0.2 mg/dL (ref 0.0–1.2)
CO2: 15 mmol/L — ABNORMAL LOW (ref 20–29)
Calcium: 8.2 mg/dL — ABNORMAL LOW (ref 8.7–10.3)
Chloride: 106 mmol/L (ref 96–106)
Creatinine, Ser: 4.25 mg/dL — ABNORMAL HIGH (ref 0.57–1.00)
GFR calc Af Amer: 12 mL/min/{1.73_m2} — ABNORMAL LOW (ref >59)
GFR calc non Af Amer: 10 mL/min/{1.73_m2} — ABNORMAL LOW (ref >59)
Globulin, Total: 1.3 g/dL — ABNORMAL LOW (ref 1.5–4.5)
Glucose: 97 mg/dL (ref 65–99)
Potassium: 6.4 mmol/L — ABNORMAL HIGH (ref 3.5–5.2)
Sodium: 134 mmol/L (ref 134–144)
Total Protein: 5.4 g/dL — ABNORMAL LOW (ref 6.0–8.5)

## 2018-10-07 LAB — CBC
Hematocrit: 25.9 % — ABNORMAL LOW (ref 34.0–46.6)
Hemoglobin: 8.2 g/dL — ABNORMAL LOW (ref 11.1–15.9)
MCH: 29.6 pg (ref 26.6–33.0)
MCHC: 31.7 g/dL (ref 31.5–35.7)
MCV: 94 fL (ref 79–97)
Platelets: 204 10*3/uL (ref 150–450)
RBC: 2.77 x10E6/uL — ABNORMAL LOW (ref 3.77–5.28)
RDW: 14.6 % (ref 11.7–15.4)
WBC: 4.4 10*3/uL (ref 3.4–10.8)

## 2018-10-09 ENCOUNTER — Telehealth: Payer: Self-pay

## 2018-10-09 NOTE — Telephone Encounter (Signed)
-----   Message from [name], MD sent at 10/09/2018 12:02 PM EDT ----- Hemoglobin is down 8.2. have sent copy to Dr. Grayland Ormond.  Kidney functions are about the same. Have sent a copy for Dr. Candiss Norse to review. Limit the prescription for metolazone to no more than 3 days a week.

## 2018-10-09 NOTE — Telephone Encounter (Signed)
Pt advised.  She is on her last day of metolazone for the week.  She states it has not helped her swelling at all.  She also mentioned she started breaking out is itchy hives all over her body except her face.  She states the hives started before starting Metolazone.  She is wondering if you can suggest anything for the hives and swelling.   Please advise.    Thanks,   -Mickel Baas

## 2018-10-09 NOTE — Telephone Encounter (Signed)
Pt advised.   Thanks,   -Christinea Brizuela  

## 2018-10-09 NOTE — Telephone Encounter (Signed)
Can take OTC benadryl, 25mg  every eight hours. There's nothing else that I can do about the swelling. She needs to keep her feet elevated to the level of her heart as much as possible.  She needs to get back in with Dr. Candiss Norse about the swelling and kidney functions.

## 2018-10-12 ENCOUNTER — Encounter: Payer: Self-pay | Admitting: Physician Assistant

## 2018-10-12 ENCOUNTER — Ambulatory Visit (INDEPENDENT_AMBULATORY_CARE_PROVIDER_SITE_OTHER): Payer: Medicare Other | Admitting: Physician Assistant

## 2018-10-12 ENCOUNTER — Other Ambulatory Visit: Payer: Self-pay

## 2018-10-12 VITALS — BP 168/76 | HR 70 | Temp 98.4°F | Resp 16 | Wt 120.4 lb

## 2018-10-12 DIAGNOSIS — L03119 Cellulitis of unspecified part of limb: Secondary | ICD-10-CM | POA: Diagnosis not present

## 2018-10-12 DIAGNOSIS — L299 Pruritus, unspecified: Secondary | ICD-10-CM

## 2018-10-12 DIAGNOSIS — N185 Chronic kidney disease, stage 5: Secondary | ICD-10-CM | POA: Diagnosis not present

## 2018-10-12 MED ORDER — AMOXICILLIN-POT CLAVULANATE 875-125 MG PO TABS: 1.0000 | ORAL_TABLET | Freq: Two times a day (BID) | ORAL | 0 refills | Status: DC

## 2018-10-12 MED ORDER — DOXEPIN HCL 10 MG PO CAPS: 10.0000 mg | ORAL_CAPSULE | Freq: Four times a day (QID) | ORAL | 0 refills | Status: DC | PRN

## 2018-10-12 NOTE — Progress Notes (Signed)
Patient: Amy [name] Female    DOB: November 29, 1950   68 y.o.   MRN: [id number] Visit Date: 10/12/2018  Today's Provider: Mar Daring, PA-C   Chief Complaint  Patient presents with  . Cellulitis   Subjective:     HPI  Patient here today with c/o possible cellulitis in legs.She reports some swelling and drainage. Patient was  taking  Metolazone but reports that it did not help at all. Reports that her legs are more swollen. She reports that she is itching all over and some blisters on lower extremity. Patient was seen on 06/05 for Leg swelling. She is also taking Torsemide 40mg  daily. She does have stage 5 CKD (last creatinine was 4.25 and GFR of 10-stable) and is followed by Dr. Candiss Norse, Nephrology (last seen in Feb 2020). She also has chronic iron def anemia secondary to her CKD. Last HgB was 8.2 on 10/06/18 and she is scheduled to have her next Procrit injection on 10/23/18 with Dr. Grayland Ormond.   Allergies  Allergen Reactions  . Cephalosporins   . Clarithromycin   . Lunesta  [Eszopiclone]     Heart racing  . Penicillin V     Has patient had a PCN reaction causing immediate rash, facial/tongue/throat swelling, SOB or lightheadedness with hypotension: Yes Has patient had a PCN reaction causing severe rash involving mucus membranes or skin necrosis: No Has patient had a PCN reaction that required hospitalization No Has patient had a PCN reaction occurring within the last 10 years: No If all of the above answers are "NO", then may proceed with Cephalosporin use.  . Sulfa Antibiotics      Current Outpatient Medications:  .  amLODipine (NORVASC) 5 MG tablet, Take 2 tablets (10 mg total) by mouth daily., Disp: 90 tablet, Rfl: 0 .  aspirin EC 81 MG EC tablet, Take 1 tablet (81 mg total) by mouth daily., Disp: , Rfl:  .  buPROPion (WELLBUTRIN) 75 MG tablet, Take 1 tablet (75 mg total) by mouth 2 (two) times daily., Disp: 180 tablet, Rfl: 1 .  butalbital-acetaminophen-caffeine  (FIORICET) 50-325-40 MG tablet, TAKE 1 TO 2 TABLETS BY MOUTH EVERY 4 HOURS AS NEEDED FOR HEADACHE, Disp: 150 tablet, Rfl: 2 .  carvedilol (COREG) 25 MG tablet, TAKE 1 TABLET(25 MG) BY MOUTH TWICE DAILY WITH A MEAL, Disp: 180 tablet, Rfl: 1 .  cloNIDine (CATAPRES) 0.3 MG tablet, TAKE 1 TABLET(0.3 MG) BY MOUTH EVERY 8 HOURS, Disp: 270 tablet, Rfl: 4 .  hydrALAZINE (APRESOLINE) 100 MG tablet, Take 1 tablet (100 mg total) by mouth every 8 (eight) hours as needed., Disp: 270 tablet, Rfl: 2 .  LORazepam (ATIVAN) 1 MG tablet, TAKE 1 TABLET(1 MG) BY MOUTH TWICE DAILY AS NEEDED, Disp: 180 tablet, Rfl: 1 .  quinapril (ACCUPRIL) 5 MG tablet, Take 10 mg by mouth daily. , Disp: , Rfl:  .  torsemide (DEMADEX) 20 MG tablet, Take 40 mg by mouth every morning., Disp: , Rfl:  .  valACYclovir (VALTREX) 1000 MG tablet, TAKE 2 TABLETS BY MOUTH TWICE DAILY FOR 1 DAY AS NEEDED FOR COLD SORES, Disp: 20 tablet, Rfl: 1 .  zolpidem (AMBIEN) 10 MG tablet, Take 0.5-1 tablets (5-10 mg total) by mouth at bedtime as needed., Disp: 30 tablet, Rfl: 1 .  metolazone (ZAROXOLYN) 2.5 MG tablet, Take 1 tablet (2.5 mg total) by mouth daily as needed (FOR SWELLING, NO MORE THAN 3 DAYS A WEEK). (Patient not taking: Reported on 10/12/2018), Disp: 20 tablet,  Rfl: 1  Review of Systems  Constitutional: Negative for appetite change, chills, fatigue and fever.  Respiratory: Negative for chest tightness and shortness of breath.   Cardiovascular: Positive for leg swelling. Negative for chest pain and palpitations.  Gastrointestinal: Negative for abdominal pain, nausea and vomiting.  Skin: Positive for color change.  Neurological: Negative for dizziness and weakness.    Social History   Tobacco Use  . Smoking status: Never Smoker  . Smokeless tobacco: Never Used  Substance Use Topics  . Alcohol use: Yes    Alcohol/week: 0.0 - 2.0 standard drinks      Objective:   BP (!) 168/76 (BP Location: Right Arm, Patient Position: Sitting, Cuff  Size: Normal)   Pulse 70   Temp 98.4 F (36.9 C) (Oral)   Resp 16   Wt 120 lb 6.4 oz (54.6 kg)   BMI 20.67 kg/m  Vitals:   10/12/18 1445  BP: (!) 168/76  Pulse: 70  Resp: 16  Temp: 98.4 F (36.9 C)  TempSrc: Oral  Weight: 120 lb 6.4 oz (54.6 kg)     Physical Exam Vitals signs reviewed.  Constitutional:      General: She is not in acute distress.    Appearance: Normal appearance. She is well-developed and normal weight. She is not ill-appearing or diaphoretic.  Neck:     Musculoskeletal: Normal range of motion and neck supple.     Thyroid: No thyromegaly.     Vascular: No JVD.     Trachea: No tracheal deviation.  Cardiovascular:     Rate and Rhythm: Normal rate and regular rhythm.     Heart sounds: Normal heart sounds. No murmur. No friction rub. No gallop.   Pulmonary:     Effort: Pulmonary effort is normal. No respiratory distress.     Breath sounds: Normal breath sounds. No wheezing or rales.  Musculoskeletal:     Right lower leg: Edema (3+ pitting edema with redness) present.     Left lower leg: Edema (2-3+ pitting edema) present.  Lymphadenopathy:     Cervical: No cervical adenopathy.  Skin:    Findings: Erythema and rash present. Rash is papular (noted diffusely on her back and abdomen).  Neurological:     Mental Status: She is alert.         Assessment & Plan    1. Cellulitis of lower extremity, unspecified laterality Will give augmentin as below for cellulitis. Advised patient to keep legs elevated. Continue Torsemide 40mg  for now. Call if worsening.  - amoxicillin-clavulanate (AUGMENTIN) 875-125 MG tablet; Take 1 tablet by mouth 2 (two) times daily.  Dispense: 20 tablet; Refill: 0  2. Itching Noted on back and abdomen. Will try doxepin as below. Call if not improving.  - doxepin (SINEQUAN) 10 MG capsule; Take 1 capsule (10 mg total) by mouth 4 (four) times daily as needed.  Dispense: 120 capsule; Refill: 0  3. CKD (chronic kidney disease) stage 5,  GFR less than 15 ml/min (HCC) Advised patient to call Dr. Keturah Barre office to schedule follow up and see if her diuretic may need to be changed or adjusted due to worsening of her lower extremity edema. If no changes can be made due to her kidney function, may be a candidate for lymphedema pumps to assist keeping fluid from her lower extremities and preventing recurrent infections.    Mar Daring, PA-C  [facility]

## 2018-10-20 ENCOUNTER — Other Ambulatory Visit: Payer: Self-pay | Admitting: Family Medicine

## 2018-10-20 ENCOUNTER — Telehealth: Payer: Self-pay | Admitting: *Deleted

## 2018-10-20 DIAGNOSIS — G8929 Other chronic pain: Secondary | ICD-10-CM

## 2018-10-20 NOTE — Telephone Encounter (Signed)
  Patient answered NO to all of the pre screening COVID 19 questions.

## 2018-10-23 ENCOUNTER — Inpatient Hospital Stay: Payer: Medicare Other

## 2018-10-23 ENCOUNTER — Ambulatory Visit (INDEPENDENT_AMBULATORY_CARE_PROVIDER_SITE_OTHER): Payer: Medicare Other | Admitting: Family Medicine

## 2018-10-23 ENCOUNTER — Other Ambulatory Visit: Payer: Self-pay

## 2018-10-23 ENCOUNTER — Encounter: Payer: Self-pay | Admitting: Family Medicine

## 2018-10-23 VITALS — BP 107/63 | HR 74 | Resp 16

## 2018-10-23 VITALS — BP 128/59 | HR 75 | Temp 98.1°F | Wt 120.0 lb

## 2018-10-23 DIAGNOSIS — Z86010 Personal history of colonic polyps: Secondary | ICD-10-CM | POA: Diagnosis not present

## 2018-10-23 DIAGNOSIS — D509 Iron deficiency anemia, unspecified: Secondary | ICD-10-CM

## 2018-10-23 DIAGNOSIS — L03119 Cellulitis of unspecified part of limb: Secondary | ICD-10-CM

## 2018-10-23 DIAGNOSIS — Z7289 Other problems related to lifestyle: Secondary | ICD-10-CM | POA: Diagnosis not present

## 2018-10-23 DIAGNOSIS — M7989 Other specified soft tissue disorders: Secondary | ICD-10-CM | POA: Diagnosis not present

## 2018-10-23 DIAGNOSIS — Z79899 Other long term (current) drug therapy: Secondary | ICD-10-CM | POA: Diagnosis not present

## 2018-10-23 DIAGNOSIS — D631 Anemia in chronic kidney disease: Secondary | ICD-10-CM

## 2018-10-23 DIAGNOSIS — Z809 Family history of malignant neoplasm, unspecified: Secondary | ICD-10-CM | POA: Diagnosis not present

## 2018-10-23 DIAGNOSIS — N184 Chronic kidney disease, stage 4 (severe): Secondary | ICD-10-CM | POA: Diagnosis not present

## 2018-10-23 DIAGNOSIS — I129 Hypertensive chronic kidney disease with stage 1 through stage 4 chronic kidney disease, or unspecified chronic kidney disease: Secondary | ICD-10-CM | POA: Diagnosis not present

## 2018-10-23 DIAGNOSIS — R5383 Other fatigue: Secondary | ICD-10-CM | POA: Diagnosis not present

## 2018-10-23 DIAGNOSIS — Z8249 Family history of ischemic heart disease and other diseases of the circulatory system: Secondary | ICD-10-CM | POA: Diagnosis not present

## 2018-10-23 DIAGNOSIS — Z800 Family history of malignant neoplasm of digestive organs: Secondary | ICD-10-CM | POA: Diagnosis not present

## 2018-10-23 LAB — CBC WITH DIFFERENTIAL/PLATELET
Abs Immature Granulocytes: 0.02 10*3/uL (ref 0.00–0.07)
Basophils Absolute: 0 10*3/uL (ref 0.0–0.1)
Basophils Relative: 1 %
Eosinophils Absolute: 0.3 10*3/uL (ref 0.0–0.5)
Eosinophils Relative: 5 %
HCT: 20.3 % — ABNORMAL LOW (ref 36.0–46.0)
Hemoglobin: 6.2 g/dL — ABNORMAL LOW (ref 12.0–15.0)
Immature Granulocytes: 0 %
Lymphocytes Relative: 12 %
Lymphs Abs: 0.8 10*3/uL (ref 0.7–4.0)
MCH: 29 pg (ref 26.0–34.0)
MCHC: 30.5 g/dL (ref 30.0–36.0)
MCV: 94.9 fL (ref 80.0–100.0)
Monocytes Absolute: 0.5 10*3/uL (ref 0.1–1.0)
Monocytes Relative: 8 %
Neutro Abs: 4.8 10*3/uL (ref 1.7–7.7)
Neutrophils Relative %: 74 %
Platelets: 176 10*3/uL (ref 150–400)
RBC: 2.14 MIL/uL — ABNORMAL LOW (ref 3.87–5.11)
RDW: 15.1 % (ref 11.5–15.5)
WBC: 6.5 10*3/uL (ref 4.0–10.5)
nRBC: 0 % (ref 0.0–0.2)

## 2018-10-23 LAB — SAMPLE TO BLOOD BANK

## 2018-10-23 MED ORDER — EPOETIN ALFA 40000 UNIT/ML IJ SOLN
40000.0000 [IU] | Freq: Once | INTRAMUSCULAR | Status: AC
  Administered 2018-10-23: 13:00:00 40000 [IU] via SUBCUTANEOUS

## 2018-10-23 MED ORDER — AMOXICILLIN-POT CLAVULANATE 875-125 MG PO TABS: 1.0000 | ORAL_TABLET | Freq: Two times a day (BID) | ORAL | 0 refills | Status: DC

## 2018-10-23 NOTE — Patient Instructions (Signed)
.   Please review the attached list of medications and notify my office if there are any errors.   . Please bring all of your medications to every appointment so we can make sure that our medication list is the same as yours.   

## 2018-10-23 NOTE — Progress Notes (Signed)
Patient: Amy [name] Female    DOB: 05/04/1950   68 y.o.   MRN: [id number] Visit Date: 10/23/2018  Today's Provider: Lelon Huh, MD   Chief Complaint  Patient presents with   Cellulitis   Subjective:     HPI     Follow up for Cellulitis  The patient was last seen for this 11 days ago. Changes made at last visit include starting Augmentin for ten days.  She reports excellent compliance with treatment. She feels that condition initially improved on Augmentin, but redness never completely resolved.  She states her legs are still very swollen.  She is also complaining of some drainage from her right leg.  She also states from the knees down her leg are very itchy.  She is not having side effects.   BMET    Component Value Date/Time   NA 134 10/06/2018 1600   K 6.4 (H) 10/06/2018 1600   CL 106 10/06/2018 1600   CO2 15 (L) 10/06/2018 1600   GLUCOSE 97 10/06/2018 1600   GLUCOSE 107 (H) 06/22/2018 0533   BUN 90 (HH) 10/06/2018 1600   CREATININE 4.25 (H) 10/06/2018 1600   CREATININE 1.93 (H) 04/05/2017 1451   CALCIUM 8.2 (L) 10/06/2018 1600   GFRNONAA 10 (L) 10/06/2018 1600   GFRAA 12 (L) 10/06/2018 1600    ------------------------------------------------------------------------------------    Allergies  Allergen Reactions   Cephalosporins    Clarithromycin    Lunesta  [Eszopiclone]     Heart racing   Penicillin V     Has patient had a PCN reaction causing immediate rash, facial/tongue/throat swelling, SOB or lightheadedness with hypotension: Yes Has patient had a PCN reaction causing severe rash involving mucus membranes or skin necrosis: No Has patient had a PCN reaction that required hospitalization No Has patient had a PCN reaction occurring within the last 10 years: No If all of the above answers are "NO", then may proceed with Cephalosporin use.   Sulfa Antibiotics      Current Outpatient Medications:    amLODipine (NORVASC) 5 MG  tablet, Take 2 tablets (10 mg total) by mouth daily., Disp: 90 tablet, Rfl: 0   amoxicillin-clavulanate (AUGMENTIN) 875-125 MG tablet, Take 1 tablet by mouth 2 (two) times daily., Disp: 20 tablet, Rfl: 0   aspirin EC 81 MG EC tablet, Take 1 tablet (81 mg total) by mouth daily., Disp: , Rfl:    buPROPion (WELLBUTRIN) 75 MG tablet, Take 1 tablet (75 mg total) by mouth 2 (two) times daily., Disp: 180 tablet, Rfl: 1   butalbital-acetaminophen-caffeine (FIORICET) 50-325-40 MG tablet, TAKE 1 TO 2 TABLETS BY MOUTH EVERY 4 HOURS AS NEEDED FOR HEADACHE, Disp: 150 tablet, Rfl: 3   carvedilol (COREG) 25 MG tablet, TAKE 1 TABLET(25 MG) BY MOUTH TWICE DAILY WITH A MEAL, Disp: 180 tablet, Rfl: 1   cloNIDine (CATAPRES) 0.3 MG tablet, TAKE 1 TABLET(0.3 MG) BY MOUTH EVERY 8 HOURS, Disp: 270 tablet, Rfl: 4   doxepin (SINEQUAN) 10 MG capsule, Take 1 capsule (10 mg total) by mouth 4 (four) times daily as needed., Disp: 120 capsule, Rfl: 0   hydrALAZINE (APRESOLINE) 100 MG tablet, Take 1 tablet (100 mg total) by mouth every 8 (eight) hours as needed., Disp: 270 tablet, Rfl: 2   LORazepam (ATIVAN) 1 MG tablet, TAKE 1 TABLET(1 MG) BY MOUTH TWICE DAILY AS NEEDED, Disp: 180 tablet, Rfl: 1   metolazone (ZAROXOLYN) 2.5 MG tablet, Take 1 tablet (2.5 mg total) by mouth  daily as needed (FOR SWELLING, NO MORE THAN 3 DAYS A WEEK). (Patient not taking: Reported on 10/12/2018), Disp: 20 tablet, Rfl: 1   quinapril (ACCUPRIL) 5 MG tablet, Take 10 mg by mouth daily. , Disp: , Rfl:    torsemide (DEMADEX) 20 MG tablet, Take 40 mg by mouth every morning., Disp: , Rfl:    valACYclovir (VALTREX) 1000 MG tablet, TAKE 2 TABLETS BY MOUTH TWICE DAILY FOR 1 DAY AS NEEDED FOR COLD SORES, Disp: 20 tablet, Rfl: 1   zolpidem (AMBIEN) 10 MG tablet, Take 0.5-1 tablets (5-10 mg total) by mouth at bedtime as needed., Disp: 30 tablet, Rfl: 1  Review of Systems  Constitutional: Positive for fatigue and unexpected weight change. Negative for  activity change, appetite change, chills, diaphoresis and fever.  Cardiovascular: Positive for leg swelling. Negative for chest pain and palpitations.  Skin: Positive for rash. Negative for color change, pallor and wound.       Itching     Social History   Tobacco Use   Smoking status: Never Smoker   Smokeless tobacco: Never Used  Substance Use Topics   Alcohol use: Yes    Alcohol/week: 0.0 - 2.0 standard drinks      Objective:   BP (!) 128/59 (BP Location: Right Arm, Patient Position: Sitting, Cuff Size: Normal)    Pulse 75    Temp 98.1 F (36.7 C) (Oral)    Wt 120 lb (54.4 kg)    BMI 20.60 kg/m     Physical Exam  General appearance: alert, well developed, well nourished, cooperative and in no distress Head: Normocephalic, without obvious abnormality, atraumatic Respiratory: Respirations even and unlabored, normal respiratory rate Extremities: 3+ bilateral edema both lower extremities with scattered small dry vesicles. Faint erythema right anterior lower leg.      Assessment & Plan    1. Cellulitis of lower extremity, unspecified laterality Initially improved, but never resolved completely, will prescription another round of - amoxicillin-clavulanate (AUGMENTIN) 875-125 MG tablet; Take 1 tablet by mouth 2 (two) times daily for 10 days.  Dispense: 20 tablet; Refill: 0  2. Leg swelling Secondary to CKD and medications. Strongly encouraged to follow up with nephrology asap and strongly encouraged to elevate legs to level of heart when not ambulating.   The entirety of the information documented in the History of Present Illness, Review of Systems and Physical Exam were personally obtained by me. Portions of this information were initially documented by Ashley Royalty, CMA and reviewed by me for thoroughness and accuracy.      Lelon Huh, MD  [facility]

## 2018-10-24 ENCOUNTER — Other Ambulatory Visit: Payer: Self-pay | Admitting: Oncology

## 2018-10-24 ENCOUNTER — Other Ambulatory Visit: Payer: Self-pay | Admitting: Family Medicine

## 2018-10-24 ENCOUNTER — Other Ambulatory Visit: Payer: Self-pay | Admitting: *Deleted

## 2018-10-24 DIAGNOSIS — G4700 Insomnia, unspecified: Secondary | ICD-10-CM

## 2018-10-24 DIAGNOSIS — D649 Anemia, unspecified: Secondary | ICD-10-CM

## 2018-10-25 ENCOUNTER — Other Ambulatory Visit: Payer: Self-pay

## 2018-10-25 ENCOUNTER — Inpatient Hospital Stay: Payer: Medicare Other

## 2018-10-25 ENCOUNTER — Other Ambulatory Visit: Payer: Self-pay | Admitting: Oncology

## 2018-10-25 DIAGNOSIS — Z79899 Other long term (current) drug therapy: Secondary | ICD-10-CM | POA: Diagnosis not present

## 2018-10-25 DIAGNOSIS — N189 Chronic kidney disease, unspecified: Secondary | ICD-10-CM

## 2018-10-25 DIAGNOSIS — D649 Anemia, unspecified: Secondary | ICD-10-CM

## 2018-10-25 DIAGNOSIS — N184 Chronic kidney disease, stage 4 (severe): Secondary | ICD-10-CM

## 2018-10-25 DIAGNOSIS — Z7289 Other problems related to lifestyle: Secondary | ICD-10-CM | POA: Diagnosis not present

## 2018-10-25 DIAGNOSIS — D631 Anemia in chronic kidney disease: Secondary | ICD-10-CM

## 2018-10-25 DIAGNOSIS — R5383 Other fatigue: Secondary | ICD-10-CM | POA: Diagnosis not present

## 2018-10-25 DIAGNOSIS — I129 Hypertensive chronic kidney disease with stage 1 through stage 4 chronic kidney disease, or unspecified chronic kidney disease: Secondary | ICD-10-CM | POA: Diagnosis not present

## 2018-10-25 LAB — TYPE AND SCREEN
ABO/RH(D): O POS
Antibody Screen: NEGATIVE
Unit division: 0
Unit division: 0

## 2018-10-25 LAB — SAMPLE TO BLOOD BANK

## 2018-10-25 LAB — PREPARE RBC (CROSSMATCH)

## 2018-10-25 LAB — BPAM RBC
Blood Product Expiration Date: 202006252359
Blood Product Expiration Date: 202007162359
Unit Type and Rh: 5100
Unit Type and Rh: 5100

## 2018-10-25 MED ORDER — DIPHENHYDRAMINE HCL 50 MG/ML IJ SOLN
25.0000 mg | Freq: Once | INTRAMUSCULAR | Status: AC
  Administered 2018-10-25: 11:00:00 25 mg via INTRAVENOUS
  Filled 2018-10-25: qty 1

## 2018-10-25 MED ORDER — ACETAMINOPHEN 325 MG PO TABS
650.0000 mg | ORAL_TABLET | Freq: Once | ORAL | Status: AC
  Administered 2018-10-25: 11:00:00 650 mg via ORAL
  Filled 2018-10-25: qty 2

## 2018-10-25 MED ORDER — SODIUM CHLORIDE 0.9% IV SOLUTION
250.0000 mL | Freq: Once | INTRAVENOUS | Status: AC
  Administered 2018-10-25: 250 mL via INTRAVENOUS
  Filled 2018-10-25: qty 250

## 2018-10-26 LAB — BPAM RBC
Blood Product Expiration Date: 202006252359
ISSUE DATE / TIME: 202006241127
Unit Type and Rh: 5100

## 2018-10-26 LAB — TYPE AND SCREEN
ABO/RH(D): O POS
Antibody Screen: NEGATIVE
Unit division: 0

## 2018-10-27 ENCOUNTER — Telehealth: Payer: Self-pay

## 2018-10-27 DIAGNOSIS — N185 Chronic kidney disease, stage 5: Secondary | ICD-10-CM

## 2018-10-27 NOTE — Telephone Encounter (Signed)
Patient calling that she wants Dr.Fisher to refer her please to [facility] for her Kidneys. Attention to [place] number: [phone]

## 2018-10-29 NOTE — Telephone Encounter (Signed)
Referral ordered 10-29-2018

## 2018-11-01 ENCOUNTER — Telehealth: Payer: Self-pay

## 2018-11-01 DIAGNOSIS — L03119 Cellulitis of unspecified part of limb: Secondary | ICD-10-CM

## 2018-11-01 MED ORDER — DOXYCYCLINE HYCLATE 100 MG PO TABS: 100.0000 mg | ORAL_TABLET | Freq: Two times a day (BID) | ORAL | 0 refills | Status: DC

## 2018-11-01 NOTE — Telephone Encounter (Signed)
Change to doxycyline. This was sent in. Has patient scheduled f/u with nephrology as directed by Dr. Caryn Section yet?

## 2018-11-01 NOTE — Telephone Encounter (Signed)
Patient advised and reports that she call the Nephrologist office and is waiting to hear back from them.

## 2018-11-01 NOTE — Telephone Encounter (Signed)
Patient has been seen for cellulitis of the left lower extremity.  She states that the blisters are now oozing and medication is not helping the itching.  Would like to know what can be done to help  CB# [phone]

## 2018-11-02 ENCOUNTER — Other Ambulatory Visit: Payer: Self-pay | Admitting: Family Medicine

## 2018-11-02 DIAGNOSIS — L03119 Cellulitis of unspecified part of limb: Secondary | ICD-10-CM

## 2018-11-08 ENCOUNTER — Other Ambulatory Visit: Payer: Self-pay

## 2018-11-08 ENCOUNTER — Encounter: Payer: Self-pay | Admitting: Family Medicine

## 2018-11-08 ENCOUNTER — Ambulatory Visit (INDEPENDENT_AMBULATORY_CARE_PROVIDER_SITE_OTHER): Payer: Medicare Other | Admitting: Family Medicine

## 2018-11-08 VITALS — BP 176/75 | HR 76 | Temp 97.6°F | Wt 117.6 lb

## 2018-11-08 DIAGNOSIS — M7989 Other specified soft tissue disorders: Secondary | ICD-10-CM

## 2018-11-08 DIAGNOSIS — L299 Pruritus, unspecified: Secondary | ICD-10-CM | POA: Diagnosis not present

## 2018-11-08 DIAGNOSIS — N185 Chronic kidney disease, stage 5: Secondary | ICD-10-CM

## 2018-11-08 DIAGNOSIS — D631 Anemia in chronic kidney disease: Secondary | ICD-10-CM | POA: Diagnosis not present

## 2018-11-08 DIAGNOSIS — N184 Chronic kidney disease, stage 4 (severe): Secondary | ICD-10-CM

## 2018-11-08 DIAGNOSIS — I150 Renovascular hypertension: Secondary | ICD-10-CM | POA: Diagnosis not present

## 2018-11-08 MED ORDER — METOLAZONE 2.5 MG PO TABS: 2.5000 mg | ORAL_TABLET | Freq: Every day | ORAL | 0 refills | Status: DC

## 2018-11-08 NOTE — Assessment & Plan Note (Signed)
Patient with pruritus Her rash seems to be related to her chronic lower extremity edema as well as likely some neurodermatitis I discussed that the pruritus may be related to her chronic kidney disease and hyperphosphatemia, which we will recheck today, as well as the swelling itself We are treating the swelling as above If her phosphorus is above 5.5 again, consider phosphorus binder Also advised follow-up with nephrology

## 2018-11-08 NOTE — Patient Instructions (Signed)
Chronic Kidney Disease, Adult Chronic kidney disease (CKD) occurs when the kidneys become damaged slowly over a long period of time. The kidneys are a pair of organs that do many important jobs in the body, including:  Removing waste and extra fluid from the blood to make urine.  Making hormones that maintain the amount of fluid in tissues and blood vessels.  Maintaining the right amount of fluids and chemicals in the body. A small amount of kidney damage may not cause problems, but a large amount of damage may make it hard or impossible for the kidneys to work the way they should. If steps are not taken to slow down kidney damage or to stop it from getting worse, the kidneys may stop working permanently (end-stage renal disease or ESRD). Most of the time, CKD does not go away, but it can often be controlled. People who have CKD are usually able to live normal lives. What are the causes? The most common causes of this condition are diabetes and high blood pressure (hypertension). Other causes include:  Heart and blood vessel (cardiovascular) disease.  Kidney diseases, such as: ? Glomerulonephritis. ? Interstitial nephritis. ? Polycystic kidney disease. ? Renal vascular disease.  Diseases that affect the immune system.  Genetic diseases.  Medicines that damage the kidneys, such as anti-inflammatory medicines.  Being around or being in contact with poisonous (toxic) substances.  A kidney or urinary infection that occurs again and again (recurs).  Vasculitis. This is swelling or inflammation of the blood vessels.  A problem with urine flow that may be caused by: ? Cancer. ? Having kidney stones more than one time. ? An enlarged prostate, in males. What increases the risk? You are more likely to develop this condition if you:  Are older than age 60.  Are female.  Are African-American, Hispanic, Asian, Pacific Islander, or American Indian.  Are a current or former smoker.   Are obese.  Have a family history of kidney disease or failure.  Often take medicines that are damaging to the kidneys. What are the signs or symptoms? Symptoms of this condition include:  Swelling (edema) of the face, legs, ankles, or feet.  Tiredness (lethargy) and having less energy.  Nausea or vomiting.  Confusion or trouble concentrating.  Problems with urination, such as: ? Painful or burning feeling during urination. ? Decreased urine production. ? Frequent urination, especially at night. ? Bloody urine.  Muscle twitches and cramps, especially in the legs.  Shortness of breath.  Weakness.  Loss of appetite.  Metallic taste in the mouth.  Trouble sleeping.  Dry, itchy skin.  A low blood count (anemia).  Pale lining of the eyelids and surface of the eye (conjunctiva). Symptoms develop slowly and may not be obvious until the kidney damage becomes severe. It is possible to have kidney disease for years without having any symptoms. How is this diagnosed? This condition may be diagnosed based on:  Blood tests.  Urine tests.  Imaging tests, such as an ultrasound or CT scan.  A test in which a sample of tissue is removed from the kidneys to be examined under a microscope (kidney biopsy). These test results will help your health care provider determine how serious the CKD is. How is this treated? There is no cure for most cases of this condition, but treatment usually relieves symptoms and prevents or slows the progression of the disease. Treatment may include:  Making diet changes, which may require you to avoid alcohol, salty foods (sodium),   and foods that are high in potassium, calcium, and protein.  Medicines: ? To lower blood pressure. ? To control blood glucose. ? To relieve anemia. ? To relieve swelling. ? To protect your bones. ? To improve the balance of electrolytes in your blood.  Removing toxic waste from the body through types of dialysis, if  the kidneys can no longer do their job (kidney failure).  Managing any other conditions that are causing your CKD or making it worse. Follow these instructions at home: Medicines  Take over-the-counter and prescription medicines only as told by your health care provider. The dose of some medicines that you take may need to be adjusted.  Do not take any new medicines unless approved by your health care provider. Many medicines can worsen your kidney damage.  Do not take any vitamin and mineral supplements unless approved by your health care provider. Many nutritional supplements can worsen your kidney damage. General instructions  Follow your prescribed diet as told by your health care provider.  Do not use any products that contain nicotine or tobacco, such as cigarettes and e-cigarettes. If you need help quitting, ask your health care provider.  Monitor and track your blood pressure at home. Report changes in your blood pressure as told by your health care provider.  If you are being treated for diabetes, monitor and track your blood sugar (blood glucose) levels as told by your health care provider.  Maintain a healthy weight. If you need help with this, ask your health care provider.  Start or continue an exercise plan. Exercise at least 30 minutes a day, 5 days a week.  Keep your immunizations up to date as told by your health care provider.  Keep all follow-up visits as told by your health care provider. This is important. Where to find more information  American Association of Kidney Patients: www.aakp.org  National Kidney Foundation: www.kidney.org  American Kidney Fund: www.akfinc.org  [facility]: www.lifeoptions.org and www.kidneyschool.org Contact a health care provider if:  Your symptoms get worse.  You develop new symptoms. Get help right away if:  You develop symptoms of ESRD, which include: ? Headaches. ? Numbness in the hands or  feet. ? Easy bruising. ? Frequent hiccups. ? Chest pain. ? Shortness of breath. ? Lack of menstruation, in women.  You have a fever.  You have decreased urine production.  You have pain or bleeding when you urinate. Summary  Chronic kidney disease (CKD) occurs when the kidneys become damaged slowly over a long period of time.  The most common causes of this condition are diabetes and high blood pressure (hypertension).  There is no cure for most cases of this condition, but treatment usually relieves symptoms and prevents or slows the progression of the disease. Treatment may include a combination of medicines and lifestyle changes. This information is not intended to replace advice given to you by your health care provider. Make sure you discuss any questions you have with your health care provider. Document Released: 01/27/2008 Document Revised: 04/01/2017 Document Reviewed: 05/27/2016 Elsevier Patient Education  2020 Elsevier Inc.  

## 2018-11-08 NOTE — Assessment & Plan Note (Signed)
Patient's leg swelling is likely related to her significant CKD 5 Patient was previously doing well on torsemide 40 mg daily, but this seems to not be putting her in a euvolemic state anymore I encouraged her to get follow-up with her nephrologist soon I will check labs today Anemia may also be contributing to this Hyperkalemia may be contributing to her hypertension as well She can continue her torsemide 40 mg daily, but I will increase her metolazone to 2.5 mg every day Discussed that she needs to follow-up with [place] or nephrology within the next 2 weeks and have her labs rechecked to ensure no worsening of her kidney function with the addition of metolazone

## 2018-11-08 NOTE — Assessment & Plan Note (Signed)
Chronic and uncontrolled Has been severely elevated for the last several visits and she was even hospitalized for management She insists she is taking her medications with good compliance We are adding metolazone daily today, to see if this helps She does desperately need to follow-up with nephrology Also check labs today

## 2018-11-08 NOTE — Progress Notes (Signed)
Patient: Amy [name] Female    DOB: 16-Jul-1950   68 y.o.   MRN: [id number] Visit Date: 11/08/2018  Today's Provider: Lavon Paganini, MD   Chief Complaint  Patient presents with  . Leg Swelling   Subjective:    I, Porsha McClurkin CMA, am acting as a scribe for Lavon Paganini, MD.   HPI  Leg Swelling Patient presents today for bilateral leg swelling that rotates to her feet for about 1 month off/on. Patient states that it has been a rash and is itchy. Patient states she used to be on Lasix but was taken off and put on Metolazone and it did not work. She only took low dose of metolazone for 3 days.  She does continue to take Torsemide 40mg  daily and reports significant urine output.  She is followwed by Dr Candiss Norse for Nephrology and has an appt with a Nephrologist at [facility] in September for a second opinion.  She has not seen Dr Candiss Norse since she was hospitalized in 06/2018 for BP control and renal biopsy.  Patient ended up not getting renal biopsy due to UTI that was found during hospitalization.  She was recently treated for bilateral lower extremity cellulitis.  She reports that the redness has improved, but she still notices an itchy rash and swelling of her legs.  Of note, patient is not on any phosphorus binders and her last phosphorus level is 5.5  Allergies  Allergen Reactions  . Cephalosporins   . Clarithromycin   . Lunesta  [Eszopiclone]     Heart racing  . Penicillin V     Has patient had a PCN reaction causing immediate rash, facial/tongue/throat swelling, SOB or lightheadedness with hypotension: Yes Has patient had a PCN reaction causing severe rash involving mucus membranes or skin necrosis: No Has patient had a PCN reaction that required hospitalization No Has patient had a PCN reaction occurring within the last 10 years: No If all of the above answers are "NO", then may proceed with Cephalosporin use.  . Sulfa Antibiotics      Current Outpatient  Medications:  .  amLODipine (NORVASC) 5 MG tablet, Take 2 tablets (10 mg total) by mouth daily., Disp: 90 tablet, Rfl: 0 .  aspirin EC 81 MG EC tablet, Take 1 tablet (81 mg total) by mouth daily., Disp: , Rfl:  .  buPROPion (WELLBUTRIN) 75 MG tablet, Take 1 tablet (75 mg total) by mouth 2 (two) times daily., Disp: 180 tablet, Rfl: 1 .  butalbital-acetaminophen-caffeine (FIORICET) 50-325-40 MG tablet, TAKE 1 TO 2 TABLETS BY MOUTH EVERY 4 HOURS AS NEEDED FOR HEADACHE, Disp: 150 tablet, Rfl: 3 .  carvedilol (COREG) 25 MG tablet, TAKE 1 TABLET(25 MG) BY MOUTH TWICE DAILY WITH A MEAL, Disp: 180 tablet, Rfl: 1 .  cloNIDine (CATAPRES) 0.3 MG tablet, TAKE 1 TABLET(0.3 MG) BY MOUTH EVERY 8 HOURS, Disp: 270 tablet, Rfl: 4 .  doxepin (SINEQUAN) 10 MG capsule, Take 1 capsule (10 mg total) by mouth 4 (four) times daily as needed., Disp: 120 capsule, Rfl: 0 .  hydrALAZINE (APRESOLINE) 100 MG tablet, Take 1 tablet (100 mg total) by mouth every 8 (eight) hours as needed., Disp: 270 tablet, Rfl: 2 .  LORazepam (ATIVAN) 1 MG tablet, TAKE 1 TABLET(1 MG) BY MOUTH TWICE DAILY AS NEEDED, Disp: 180 tablet, Rfl: 1 .  quinapril (ACCUPRIL) 5 MG tablet, Take 10 mg by mouth daily. , Disp: , Rfl:  .  torsemide (DEMADEX) 20 MG tablet, Take  40 mg by mouth every morning., Disp: , Rfl:  .  valACYclovir (VALTREX) 1000 MG tablet, TAKE 2 TABLETS BY MOUTH TWICE DAILY FOR 1 DAY AS NEEDED FOR COLD SORES, Disp: 20 tablet, Rfl: 1 .  zolpidem (AMBIEN) 10 MG tablet, TAKE 1/2 TO 1 TABLET(5 TO 10 MG) BY MOUTH AT BEDTIME AS NEEDED, Disp: 30 tablet, Rfl: 3 .  amoxicillin-clavulanate (AUGMENTIN) 875-125 MG tablet, TAKE 1 TABLET BY MOUTH TWICE DAILY FOR 10 DAYS (Patient not taking: Reported on 11/08/2018), Disp: 20 tablet, Rfl: 0 .  doxycycline (VIBRA-TABS) 100 MG tablet, Take 1 tablet (100 mg total) by mouth 2 (two) times daily. (Patient not taking: Reported on 11/08/2018), Disp: 20 tablet, Rfl: 0 .  metolazone (ZAROXOLYN) 2.5 MG tablet, Take 1 tablet  (2.5 mg total) by mouth daily as needed (FOR SWELLING, NO MORE THAN 3 DAYS A WEEK). (Patient not taking: Reported on 10/12/2018), Disp: 20 tablet, Rfl: 1  Review of Systems  Constitutional: Negative.   Respiratory: Negative.   Cardiovascular: Positive for leg swelling.  Genitourinary: Negative.   Skin: Positive for rash.  Neurological: Negative.     Social History   Tobacco Use  . Smoking status: Never Smoker  . Smokeless tobacco: Never Used  Substance Use Topics  . Alcohol use: Yes    Alcohol/week: 0.0 - 2.0 standard drinks      Objective:   BP (!) 176/75 (BP Location: Left Arm, Patient Position: Sitting, Cuff Size: Normal)   Pulse 76   Temp 97.6 F (36.4 C) (Oral)   Wt 117 lb 9.6 oz (53.3 kg)   SpO2 97%   BMI 20.19 kg/m  Vitals:   11/08/18 1534  BP: (!) 176/75  Pulse: 76  Temp: 97.6 F (36.4 C)  TempSrc: Oral  SpO2: 97%  Weight: 117 lb 9.6 oz (53.3 kg)     Physical Exam Vitals signs reviewed.  Constitutional:      Appearance: Normal appearance.  HENT:     Head: Normocephalic and atraumatic.  Eyes:     General: No scleral icterus.    Conjunctiva/sclera: Conjunctivae normal.  Neck:     Musculoskeletal: Neck supple.  Cardiovascular:     Rate and Rhythm: Normal rate and regular rhythm.     Heart sounds: Normal heart sounds. No murmur.  Pulmonary:     Effort: Pulmonary effort is normal. No respiratory distress.     Breath sounds: No wheezing, rhonchi or rales.  Abdominal:     General: There is no distension.     Palpations: Abdomen is soft.     Tenderness: There is no abdominal tenderness.  Musculoskeletal:     Comments: 3+ pitting edema of bilateral lower extremities to the mid thighs  Lymphadenopathy:     Cervical: Cervical adenopathy (1 shotty LN on right anterior cervical chain) present.  Skin:    General: Skin is warm and dry.     Findings: Rash (hyperpigmentation of anterior shins bilaterally, diffuse excoriations) present.  Neurological:      Mental Status: She is alert and oriented to person, place, and time. Mental status is at baseline.  Psychiatric:        Mood and Affect: Mood normal.        Behavior: Behavior normal.     No results found for any visits on 11/08/18.     Assessment & Plan   Problem List Items Addressed This Visit      Cardiovascular and Mediastinum   Hypertension    Chronic and uncontrolled  Has been severely elevated for the last several visits and she was even hospitalized for management She insists she is taking her medications with good compliance We are adding metolazone daily today, to see if this helps She does desperately need to follow-up with nephrology Also check labs today      Relevant Medications   metolazone (ZAROXOLYN) 2.5 MG tablet     Genitourinary   CKD (chronic kidney disease) stage 5, GFR less than 15 ml/min (HCC) - Primary    Patient with stage V CKD Believed to be secondary to hypertensive nephropathy Advised that she needs more regular follow-up with her nephrologist Discussed that she would likely benefit from rescheduling her renal biopsy as this did not seem to progress quickly and renal biopsy results may be able to guide treatment further Discussed that further progression of her CKD could lead to ESRD which would necessitate dialysis for renal transplant      Relevant Orders   Basic Metabolic Panel (BMET)   Magnesium   CBC w/Diff/Platelet   Phosphorus     Other   Leg swelling    Patient's leg swelling is likely related to her significant CKD 5 Patient was previously doing well on torsemide 40 mg daily, but this seems to not be putting her in a euvolemic state anymore I encouraged her to get follow-up with her nephrologist soon I will check labs today Anemia may also be contributing to this Hyperkalemia may be contributing to her hypertension as well She can continue her torsemide 40 mg daily, but I will increase her metolazone to 2.5 mg every day  Discussed that she needs to follow-up with [place] or nephrology within the next 2 weeks and have her labs rechecked to ensure no worsening of her kidney function with the addition of metolazone      Relevant Medications   metolazone (ZAROXOLYN) 2.5 MG tablet   Other Relevant Orders   Basic Metabolic Panel (BMET)   Magnesium   CBC w/Diff/Platelet   Phosphorus   Anemia in chronic renal disease    Patient is getting EPO shots regularly and followed by hematology Recheck CBC today Discussed that significant anemia can contribute to leg swelling as well      Relevant Orders   CBC w/Diff/Platelet   Itching    Patient with pruritus Her rash seems to be related to her chronic lower extremity edema as well as likely some neurodermatitis I discussed that the pruritus may be related to her chronic kidney disease and hyperphosphatemia, which we will recheck today, as well as the swelling itself We are treating the swelling as above If her phosphorus is above 5.5 again, consider phosphorus binder Also advised follow-up with nephrology      Relevant Orders   Basic Metabolic Panel (BMET)   Phosphorus       Return in about 2 weeks (around 11/22/2018) for Swelling and kidney f/u.   The entirety of the information documented in the History of Present Illness, Review of Systems and Physical Exam were personally obtained by me. Portions of this information were initially documented by [facility], CMA and reviewed by me for thoroughness and accuracy.    Rayah Fines, Dionne Bucy, MD MPH [facility]

## 2018-11-08 NOTE — Assessment & Plan Note (Signed)
Patient with stage V CKD Believed to be secondary to hypertensive nephropathy Advised that she needs more regular follow-up with her nephrologist Discussed that she would likely benefit from rescheduling her renal biopsy as this did not seem to progress quickly and renal biopsy results may be able to guide treatment further Discussed that further progression of her CKD could lead to ESRD which would necessitate dialysis for renal transplant

## 2018-11-08 NOTE — Assessment & Plan Note (Signed)
Patient is getting EPO shots regularly and followed by hematology Recheck CBC today Discussed that significant anemia can contribute to leg swelling as well

## 2018-11-09 ENCOUNTER — Telehealth: Payer: Self-pay

## 2018-11-09 DIAGNOSIS — N185 Chronic kidney disease, stage 5: Secondary | ICD-10-CM | POA: Diagnosis not present

## 2018-11-09 DIAGNOSIS — I129 Hypertensive chronic kidney disease with stage 1 through stage 4 chronic kidney disease, or unspecified chronic kidney disease: Secondary | ICD-10-CM | POA: Diagnosis not present

## 2018-11-09 DIAGNOSIS — E875 Hyperkalemia: Secondary | ICD-10-CM | POA: Diagnosis not present

## 2018-11-09 DIAGNOSIS — R600 Localized edema: Secondary | ICD-10-CM | POA: Diagnosis not present

## 2018-11-09 LAB — CBC WITH DIFFERENTIAL/PLATELET
Basophils Absolute: 0.1 10*3/uL (ref 0.0–0.2)
Basos: 1 %
EOS (ABSOLUTE): 0.4 10*3/uL (ref 0.0–0.4)
Eos: 8 %
Hematocrit: 28.5 % — ABNORMAL LOW (ref 34.0–46.6)
Hemoglobin: 9.2 g/dL — ABNORMAL LOW (ref 11.1–15.9)
Immature Grans (Abs): 0 10*3/uL (ref 0.0–0.1)
Immature Granulocytes: 0 %
Lymphocytes Absolute: 0.8 10*3/uL (ref 0.7–3.1)
Lymphs: 16 %
MCH: 28.5 pg (ref 26.6–33.0)
MCHC: 32.3 g/dL (ref 31.5–35.7)
MCV: 88 fL (ref 79–97)
Monocytes Absolute: 0.4 10*3/uL (ref 0.1–0.9)
Monocytes: 9 %
Neutrophils Absolute: 3.5 10*3/uL (ref 1.4–7.0)
Neutrophils: 66 %
Platelets: 235 10*3/uL (ref 150–450)
RBC: 3.23 x10E6/uL — ABNORMAL LOW (ref 3.77–5.28)
RDW: 16.1 % — ABNORMAL HIGH (ref 11.7–15.4)
WBC: 5.2 10*3/uL (ref 3.4–10.8)

## 2018-11-09 LAB — BASIC METABOLIC PANEL
BUN/Creatinine Ratio: 15 (ref 12–28)
BUN: 77 mg/dL (ref 8–27)
CO2: 11 mmol/L — ABNORMAL LOW (ref 20–29)
Calcium: 8.2 mg/dL — ABNORMAL LOW (ref 8.7–10.3)
Chloride: 107 mmol/L — ABNORMAL HIGH (ref 96–106)
Creatinine, Ser: 5 mg/dL — ABNORMAL HIGH (ref 0.57–1.00)
GFR calc Af Amer: 9 mL/min/{1.73_m2} — ABNORMAL LOW (ref >59)
GFR calc non Af Amer: 8 mL/min/{1.73_m2} — ABNORMAL LOW (ref >59)
Glucose: 95 mg/dL (ref 65–99)
Potassium: 6.2 mmol/L — ABNORMAL HIGH (ref 3.5–5.2)
Sodium: 135 mmol/L (ref 134–144)

## 2018-11-09 LAB — PHOSPHORUS: Phosphorus: 6.1 mg/dL — ABNORMAL HIGH (ref 3.0–4.3)

## 2018-11-09 LAB — MAGNESIUM: Magnesium: 1.6 mg/dL (ref 1.6–2.3)

## 2018-11-09 NOTE — Telephone Encounter (Signed)
Pt advised. She agreed to start the new medication.  Please send to [facility].  I also advised her to call nephrology today to schedule an appointment and that if she can't get in next week to call back and repeat labs.    Thanks,   -Mickel Baas

## 2018-11-09 NOTE — Telephone Encounter (Signed)
-----   Message from Virginia Crews, MD sent at 11/09/2018  8:19 AM EDT ----- Kidney function is worse than it was previously.  Very important to see Nephrology ASAP.  Phosphorus level is high. This could be the cause of the itching.  We can add a medication daily for binding up the phosphorus and decreasing this level.  Will Rx if ok with patient.  If unable to see Nephrology next week, we should recheck labs in 1 wk.

## 2018-11-10 ENCOUNTER — Telehealth: Payer: Self-pay | Admitting: Family Medicine

## 2018-11-10 MED ORDER — SEVELAMER CARBONATE 800 MG PO TABS: 800.0000 mg | ORAL_TABLET | Freq: Three times a day (TID) | ORAL | 0 refills | Status: DC

## 2018-11-10 NOTE — Addendum Note (Signed)
Addended by: Virginia Crews on: 11/10/2018 12:27 PM   Modules accepted: Orders

## 2018-11-10 NOTE — Telephone Encounter (Signed)
[facility] is requesting new Rx for sevelamer carbonate (RENVELA) 800 MG tablet to fill as a 90 day supply. Please advise. Thanks TNP

## 2018-11-10 NOTE — Telephone Encounter (Signed)
Medication refill was send to pharmacy.

## 2018-11-13 ENCOUNTER — Inpatient Hospital Stay: Payer: Medicare Other

## 2018-11-13 ENCOUNTER — Inpatient Hospital Stay: Payer: Medicare Other | Attending: Oncology

## 2018-11-13 ENCOUNTER — Other Ambulatory Visit: Payer: Self-pay

## 2018-11-13 VITALS — BP 121/78 | HR 76

## 2018-11-13 DIAGNOSIS — Z7289 Other problems related to lifestyle: Secondary | ICD-10-CM | POA: Insufficient documentation

## 2018-11-13 DIAGNOSIS — N184 Chronic kidney disease, stage 4 (severe): Secondary | ICD-10-CM | POA: Diagnosis not present

## 2018-11-13 DIAGNOSIS — N185 Chronic kidney disease, stage 5: Secondary | ICD-10-CM | POA: Diagnosis not present

## 2018-11-13 DIAGNOSIS — R809 Proteinuria, unspecified: Secondary | ICD-10-CM | POA: Diagnosis not present

## 2018-11-13 DIAGNOSIS — Z809 Family history of malignant neoplasm, unspecified: Secondary | ICD-10-CM | POA: Diagnosis not present

## 2018-11-13 DIAGNOSIS — D631 Anemia in chronic kidney disease: Secondary | ICD-10-CM | POA: Diagnosis not present

## 2018-11-13 DIAGNOSIS — Z800 Family history of malignant neoplasm of digestive organs: Secondary | ICD-10-CM | POA: Insufficient documentation

## 2018-11-13 DIAGNOSIS — I120 Hypertensive chronic kidney disease with stage 5 chronic kidney disease or end stage renal disease: Secondary | ICD-10-CM | POA: Diagnosis not present

## 2018-11-13 DIAGNOSIS — Z8249 Family history of ischemic heart disease and other diseases of the circulatory system: Secondary | ICD-10-CM | POA: Diagnosis not present

## 2018-11-13 DIAGNOSIS — R600 Localized edema: Secondary | ICD-10-CM | POA: Diagnosis not present

## 2018-11-13 DIAGNOSIS — Z86010 Personal history of colonic polyps: Secondary | ICD-10-CM | POA: Diagnosis not present

## 2018-11-13 DIAGNOSIS — E871 Hypo-osmolality and hyponatremia: Secondary | ICD-10-CM | POA: Diagnosis not present

## 2018-11-13 DIAGNOSIS — R5383 Other fatigue: Secondary | ICD-10-CM | POA: Diagnosis not present

## 2018-11-13 DIAGNOSIS — I129 Hypertensive chronic kidney disease with stage 1 through stage 4 chronic kidney disease, or unspecified chronic kidney disease: Secondary | ICD-10-CM | POA: Diagnosis not present

## 2018-11-13 DIAGNOSIS — D509 Iron deficiency anemia, unspecified: Secondary | ICD-10-CM

## 2018-11-13 LAB — CBC WITH DIFFERENTIAL/PLATELET
Abs Immature Granulocytes: 0.02 10*3/uL (ref 0.00–0.07)
Basophils Absolute: 0.1 10*3/uL (ref 0.0–0.1)
Basophils Relative: 1 %
Eosinophils Absolute: 0.2 10*3/uL (ref 0.0–0.5)
Eosinophils Relative: 5 %
HCT: 27.1 % — ABNORMAL LOW (ref 36.0–46.0)
Hemoglobin: 8.4 g/dL — ABNORMAL LOW (ref 12.0–15.0)
Immature Granulocytes: 0 %
Lymphocytes Relative: 12 %
Lymphs Abs: 0.6 10*3/uL — ABNORMAL LOW (ref 0.7–4.0)
MCH: 29.1 pg (ref 26.0–34.0)
MCHC: 31 g/dL (ref 30.0–36.0)
MCV: 93.8 fL (ref 80.0–100.0)
Monocytes Absolute: 0.3 10*3/uL (ref 0.1–1.0)
Monocytes Relative: 6 %
Neutro Abs: 3.6 10*3/uL (ref 1.7–7.7)
Neutrophils Relative %: 76 %
Platelets: 200 10*3/uL (ref 150–400)
RBC: 2.89 MIL/uL — ABNORMAL LOW (ref 3.87–5.11)
RDW: 17.5 % — ABNORMAL HIGH (ref 11.5–15.5)
WBC: 4.7 10*3/uL (ref 4.0–10.5)
nRBC: 0 % (ref 0.0–0.2)

## 2018-11-13 LAB — SAMPLE TO BLOOD BANK

## 2018-11-13 MED ORDER — EPOETIN ALFA 40000 UNIT/ML IJ SOLN
40000.0000 [IU] | Freq: Once | INTRAMUSCULAR | Status: AC
  Administered 2018-11-13: 40000 [IU] via SUBCUTANEOUS
  Filled 2018-11-13: qty 1

## 2018-11-15 ENCOUNTER — Telehealth (INDEPENDENT_AMBULATORY_CARE_PROVIDER_SITE_OTHER): Payer: Self-pay

## 2018-11-15 ENCOUNTER — Other Ambulatory Visit: Payer: Self-pay | Admitting: Family Medicine

## 2018-11-15 ENCOUNTER — Other Ambulatory Visit (INDEPENDENT_AMBULATORY_CARE_PROVIDER_SITE_OTHER): Payer: Self-pay | Admitting: Nurse Practitioner

## 2018-11-15 ENCOUNTER — Other Ambulatory Visit
Admission: RE | Admit: 2018-11-15 | Discharge: 2018-11-15 | Disposition: A | Discharge status: Home / Self Care | Payer: Medicare Other | Source: Ambulatory Visit | Attending: Vascular Surgery | Admitting: Vascular Surgery

## 2018-11-15 ENCOUNTER — Other Ambulatory Visit: Payer: Self-pay

## 2018-11-15 DIAGNOSIS — Z1159 Encounter for screening for other viral diseases: Secondary | ICD-10-CM | POA: Diagnosis not present

## 2018-11-15 DIAGNOSIS — L03119 Cellulitis of unspecified part of limb: Secondary | ICD-10-CM

## 2018-11-15 LAB — SARS CORONAVIRUS 2 (TAT 6-24 HRS): SARS Coronavirus 2: NEGATIVE

## 2018-11-15 NOTE — Telephone Encounter (Signed)
Spoke with the patient and scheduled her for a permcath insertion with Dr. Lucky Cowboy on 11/16/2018.she has a  9:00 am arrival time and Covid testing to be done today between 12:30-2:30 pm. Patient did have some questions regarding the cath. Dr. Lucky Cowboy was informed of this.

## 2018-11-16 ENCOUNTER — Other Ambulatory Visit: Payer: Self-pay

## 2018-11-16 ENCOUNTER — Encounter: Payer: Self-pay | Admitting: *Deleted

## 2018-11-16 ENCOUNTER — Ambulatory Visit
Admission: RE | Admit: 2018-11-16 | Discharge: 2018-11-16 | Disposition: A | Discharge status: Home / Self Care | Payer: Medicare Other | Attending: Vascular Surgery | Admitting: Vascular Surgery

## 2018-11-16 ENCOUNTER — Encounter
Admission: RE | Disposition: A | Discharge status: Home / Self Care | Payer: Self-pay | Source: Home / Self Care | Attending: Vascular Surgery

## 2018-11-16 DIAGNOSIS — Z8249 Family history of ischemic heart disease and other diseases of the circulatory system: Secondary | ICD-10-CM | POA: Diagnosis not present

## 2018-11-16 DIAGNOSIS — D631 Anemia in chronic kidney disease: Secondary | ICD-10-CM

## 2018-11-16 DIAGNOSIS — N179 Acute kidney failure, unspecified: Secondary | ICD-10-CM | POA: Diagnosis not present

## 2018-11-16 DIAGNOSIS — I120 Hypertensive chronic kidney disease with stage 5 chronic kidney disease or end stage renal disease: Secondary | ICD-10-CM | POA: Diagnosis not present

## 2018-11-16 DIAGNOSIS — Z880 Allergy status to penicillin: Secondary | ICD-10-CM | POA: Diagnosis not present

## 2018-11-16 DIAGNOSIS — N185 Chronic kidney disease, stage 5: Secondary | ICD-10-CM

## 2018-11-16 DIAGNOSIS — Z881 Allergy status to other antibiotic agents status: Secondary | ICD-10-CM | POA: Diagnosis not present

## 2018-11-16 DIAGNOSIS — Z882 Allergy status to sulfonamides status: Secondary | ICD-10-CM | POA: Insufficient documentation

## 2018-11-16 DIAGNOSIS — N186 End stage renal disease: Secondary | ICD-10-CM | POA: Diagnosis not present

## 2018-11-16 DIAGNOSIS — D649 Anemia, unspecified: Secondary | ICD-10-CM | POA: Insufficient documentation

## 2018-11-16 DIAGNOSIS — Z992 Dependence on renal dialysis: Secondary | ICD-10-CM | POA: Diagnosis not present

## 2018-11-16 DIAGNOSIS — K219 Gastro-esophageal reflux disease without esophagitis: Secondary | ICD-10-CM | POA: Diagnosis not present

## 2018-11-16 DIAGNOSIS — Z90710 Acquired absence of both cervix and uterus: Secondary | ICD-10-CM | POA: Diagnosis not present

## 2018-11-16 HISTORY — PX: DIALYSIS/PERMA CATHETER INSERTION: CATH118288

## 2018-11-16 SURGERY — DIALYSIS/PERMA CATHETER INSERTION
Anesthesia: Moderate Sedation

## 2018-11-16 MED ORDER — FENTANYL CITRATE (PF) 100 MCG/2ML IJ SOLN
INTRAMUSCULAR | Status: DC | PRN
  Administered 2018-11-16 (×2): 50 ug via INTRAVENOUS

## 2018-11-16 MED ORDER — DIPHENHYDRAMINE HCL 50 MG/ML IJ SOLN: 50.0000 mg | Freq: Once | INTRAMUSCULAR | Status: DC | PRN

## 2018-11-16 MED ORDER — CLINDAMYCIN PHOSPHATE 300 MG/50ML IV SOLN
300.0000 mg | Freq: Once | INTRAVENOUS | Status: AC
  Administered 2018-11-16: 10:00:00 300 mg via INTRAVENOUS

## 2018-11-16 MED ORDER — MIDAZOLAM HCL 2 MG/2ML IJ SOLN
INTRAMUSCULAR | Status: DC | PRN
  Administered 2018-11-16 (×2): 2 mg via INTRAVENOUS

## 2018-11-16 MED ORDER — FAMOTIDINE 20 MG PO TABS: 40.0000 mg | ORAL_TABLET | Freq: Once | ORAL | Status: DC | PRN

## 2018-11-16 MED ORDER — HYDROMORPHONE HCL 1 MG/ML IJ SOLN: 1.0000 mg | Freq: Once | INTRAMUSCULAR | Status: DC | PRN

## 2018-11-16 MED ORDER — LIDOCAINE-EPINEPHRINE (PF) 1 %-1:200000 IJ SOLN
INTRAMUSCULAR | Status: AC
  Filled 2018-11-16: qty 30

## 2018-11-16 MED ORDER — FENTANYL CITRATE (PF) 100 MCG/2ML IJ SOLN
INTRAMUSCULAR | Status: AC
  Filled 2018-11-16: qty 2

## 2018-11-16 MED ORDER — ONDANSETRON HCL 4 MG/2ML IJ SOLN: 4.0000 mg | Freq: Four times a day (QID) | INTRAMUSCULAR | Status: DC | PRN

## 2018-11-16 MED ORDER — MIDAZOLAM HCL 5 MG/5ML IJ SOLN
INTRAMUSCULAR | Status: AC
  Filled 2018-11-16: qty 5

## 2018-11-16 MED ORDER — METHYLPREDNISOLONE SODIUM SUCC 125 MG IJ SOLR: 125.0000 mg | Freq: Once | INTRAMUSCULAR | Status: DC | PRN

## 2018-11-16 MED ORDER — MIDAZOLAM HCL 2 MG/ML PO SYRP: 8.0000 mg | ORAL_SOLUTION | Freq: Once | ORAL | Status: DC | PRN

## 2018-11-16 MED ORDER — CLINDAMYCIN PHOSPHATE 300 MG/50ML IV SOLN
INTRAVENOUS | Status: AC
  Filled 2018-11-16: qty 50

## 2018-11-16 MED ORDER — SODIUM CHLORIDE 0.9 % IV SOLN
INTRAVENOUS | Status: DC
  Administered 2018-11-16: 10:00:00 via INTRAVENOUS

## 2018-11-16 SURGICAL SUPPLY — 6 items
CATH CANNON HEMO 15FR 19 (HEMODIALYSIS SUPPLIES) ×3 IMPLANT
DERMABOND ADVANCED (GAUZE/BANDAGES/DRESSINGS) ×2
DERMABOND ADVANCED .7 DNX12 (GAUZE/BANDAGES/DRESSINGS) ×1 IMPLANT
PACK ANGIOGRAPHY (CUSTOM PROCEDURE TRAY) ×3 IMPLANT
SUT MNCRL AB 4-0 PS2 18 (SUTURE) ×3 IMPLANT
SUT PROLENE 0 CT 1 30 (SUTURE) ×3 IMPLANT

## 2018-11-16 NOTE — Discharge Instructions (Signed)
Tunneled Catheter Removal, Care After °Refer to this sheet in the next few weeks. These instructions provide you with information about caring for yourself after your procedure. Your health care provider may also give you more specific instructions. Your treatment has been planned according to current medical practices, but problems sometimes occur. Call your health care provider if you have any problems or questions after your procedure. °What can I expect after the procedure? °After the procedure, it is common to have: °· Some mild redness, swelling, and pain around your catheter site. ° ° °Follow these instructions at home: °Incision care  °· Check your removal site  every day for signs of infection. Check for: °¨ More redness, swelling, or pain. °¨ More fluid or blood. °¨ Warmth. °¨ Pus or a bad smell. °· Follow instructions from your health care provider about how to take care of your removal site. Make sure you: °¨ Wash your hands with soap and water before you change your bandages (dressings). If soap and water are not available, use hand sanitizer. °Activity  °· Return to your normal activities as told by your health care provider. Ask your health care provider what activities are safe for you. °· Do not lift anything that is heavier than 10 lb (4.5 kg) for 3 weeks or as long as told by your health care provider. ° °Contact a health care provider if: °· You have more fluid or blood coming from your removal site °· You have more redness, swelling, or pain at your incisions or around the area where your catheter was removed °· Your removal site feel warm to the touch. °· You feel unusually weak. °· You feel nauseous.. °· Get help right away if °· You have swelling in your arm, shoulder, neck, or face. °· You develop chest pain. °· You have difficulty breathing. °· You feel dizzy or light-headed. °· You have pus or a bad smell coming from your removal site °· You have a fever. °· You develop bleeding from your  removal site, and your bleeding does not stop. °This information is not intended to replace advice given to you by your health care provider. Make sure you discuss any questions you have with your health care provider. °Document Released: 04/05/2012 Document Revised: 12/21/2015 Document Reviewed: 01/13/2015 °Elsevier Interactive Patient Education © 2017 [employer] ° °

## 2018-11-16 NOTE — H&P (Signed)
[facility] Admission History & Physical  MRN : [id number]  Amy [name] is a 68 y.o. (1950-05-13) female who presents with chief complaint of No chief complaint on file. [name]  History of Present Illness: Patient presents today on referral from Dr. Candiss Norse in nephrology for renal failure and need for dialysis access.  She has not yet started dialysis.  She has chronic kidney disease but now has progressed to the point where she needs dialysis and is felt to have end-stage renal disease.  Some poor energy and appetite are present.  Lower extremity swelling is present as well.  The patient does not know her start date for dialysis yet.  Current Facility-Administered Medications  Medication Dose Route Frequency Provider Last Rate Last Dose  . 0.9 %  sodium chloride infusion   Intravenous Continuous Eulogio Ditch E, NP      . clindamycin (CLEOCIN) 300 MG/50ML IVPB           . clindamycin (CLEOCIN) IVPB 300 mg  300 mg Intravenous Once Kris Hartmann, NP      . diphenhydrAMINE (BENADRYL) injection 50 mg  50 mg Intravenous Once PRN Kris Hartmann, NP      . famotidine (PEPCID) tablet 40 mg  40 mg Oral Once PRN Kris Hartmann, NP      . HYDROmorphone (DILAUDID) injection 1 mg  1 mg Intravenous Once PRN Eulogio Ditch E, NP      . methylPREDNISolone sodium succinate (SOLU-MEDROL) 125 mg/2 mL injection 125 mg  125 mg Intravenous Once PRN Eulogio Ditch E, NP      . midazolam (VERSED) 2 MG/ML syrup 8 mg  8 mg Oral Once PRN Kris Hartmann, NP      . ondansetron (ZOFRAN) injection 4 mg  4 mg Intravenous Q6H PRN Kris Hartmann, NP        Past Medical History:  Diagnosis Date  . Anemia    a. 08/2016 Guaiac + stool. EGD w/ gastritis.  . Anxiety   . CKD (chronic kidney disease), stage III ([place])   . Colon polyp    a. 08/2016 Colonoscopy.  . Gastritis    a. 08/2016 EGD: Gastritis-->PPI.  [name] GERD (gastroesophageal reflux disease)   . History of chicken pox   . History of measles  as a child   . Hypertension   . Hyponatremia    a. 08/2016 in setting of HCTZ Rx.  . Multiple thyroid nodules     Past Surgical History:  Procedure Laterality Date  . ABDOMINAL HYSTERECTOMY  2003   due to heavy periods and clotting during menses  . BREAST LUMPECTOMY Left 1990  . CHOLECYSTECTOMY  2010  . COLONOSCOPY WITH PROPOFOL N/A 09/07/2016   Procedure: COLONOSCOPY WITH PROPOFOL;  Surgeon: Lucilla Lame, MD;  Location[facility] ENDOSCOPY;  Service: Endoscopy;  Laterality: N/A;  . DILATION AND CURETTAGE OF UTERUS  1985  . ESOPHAGOGASTRODUODENOSCOPY (EGD) WITH PROPOFOL N/A 09/07/2016   Procedure: ESOPHAGOGASTRODUODENOSCOPY (EGD) WITH PROPOFOL;  Surgeon: Lucilla Lame, MD;  Location[facility] ENDOSCOPY;  Service: Endoscopy;  Laterality: N/A;  . GALLBLADDER SURGERY  2012  . KNEE SURGERY     Dr. Tamala Julian  . MRI, Lumbar spine  07/23/2009   Multilevel annular bulge and disc protrusion at L2-L3, L3-L4, L4-L5, and L5-S1  . TONSILLECTOMY    . TONSILLECTOMY AND ADENOIDECTOMY  1970  . UPPER GI ENDOSCOPY  03/02/2006   H. Pylori negative; Dr. Allen Norris    Social History Social History  Tobacco Use  . Smoking status: Never Smoker  . Smokeless tobacco: Never Used  Substance Use Topics  . Alcohol use: Yes    Alcohol/week: 0.0 - 2.0 standard drinks  . Drug use: No    Family History Family History  Problem Relation Age of Onset  . Pancreatic cancer Father   . Congestive Heart Failure Maternal Grandmother   . Cancer Paternal Grandfather   . Heart disease Maternal Uncle     Allergies  Allergen Reactions  . Cephalosporins   . Clarithromycin   . Lunesta  [Eszopiclone]     Heart racing  . Penicillin V     Has patient had a PCN reaction causing immediate rash, facial/tongue/throat swelling, SOB or lightheadedness with hypotension: Yes Has patient had a PCN reaction causing severe rash involving mucus membranes or skin necrosis: No Has patient had a PCN reaction that required hospitalization No Has  patient had a PCN reaction occurring within the last 10 years: No If all of the above answers are "NO", then may proceed with Cephalosporin use.  . Sulfa Antibiotics      REVIEW OF SYSTEMS (Negative unless checked)  Constitutional: [] Weight loss  [] Fever  [] Chills Cardiac: [] Chest pain   [] Chest pressure   [] Palpitations   [] Shortness of breath when laying flat   [] Shortness of breath at rest   [x] Shortness of breath with exertion. Vascular:  [] Pain in legs with walking   [] Pain in legs at rest   [] Pain in legs when laying flat   [] Claudication   [] Pain in feet when walking  [] Pain in feet at rest  [] Pain in feet when laying flat   [] History of DVT   [] Phlebitis   [x] Swelling in legs   [] Varicose veins   [] Non-healing ulcers Pulmonary:   [] Uses home oxygen   [] Productive cough   [] Hemoptysis   [] Wheeze  [] COPD   [] Asthma Neurologic:  [] Dizziness  [] Blackouts   [] Seizures   [] History of stroke   [] History of TIA  [] Aphasia   [] Temporary blindness   [] Dysphagia   [] Weakness or numbness in arms   [] Weakness or numbness in legs Musculoskeletal:  [] Arthritis   [] Joint swelling   [] Joint pain   [] Low back pain Hematologic:  [] Easy bruising  [] Easy bleeding   [] Hypercoagulable state   [x] Anemic  [] Hepatitis Gastrointestinal:  [] Blood in stool   [] Vomiting blood  [x] Gastroesophageal reflux/heartburn   [] Difficulty swallowing. Genitourinary:  [x] Chronic kidney disease   [] Difficult urination  [] Frequent urination  [] Burning with urination   [] Blood in urine Skin:  [] Rashes   [] Ulcers   [] Wounds Psychological:  [] History of anxiety   []  History of major depression.  Physical Examination  Vitals:   11/16/18 0936  BP: 131/63  Pulse: 71  Resp: 16  Temp: 98.1 F (36.7 C)  TempSrc: Oral  Weight: 53.5 kg  Height: 5\' 4"  (1.626 m)   Body mass index is 20.25 kg/m. Gen: WD/WN, NAD Head: [place]/AT, No temporalis wasting. Ear/Nose/Throat: Hearing grossly intact, nares w/o erythema or drainage, oropharynx  w/o Erythema/Exudate,  Eyes: Conjunctiva clear, sclera non-icteric Neck: Trachea midline.  No JVD.  Pulmonary:  Good air movement, respirations not labored, no use of accessory muscles.  Cardiac: RRR, normal S1, S2. Vascular:  Vessel Right Left  Radial Palpable Palpable   Musculoskeletal: M/S 5/5 throughout.  Extremities without ischemic changes.  No deformity or atrophy.  2+ lower extremity edema Neurologic: Sensation grossly intact in extremities.  Symmetrical.  Speech is fluent. Motor exam as listed above.  Psychiatric: Judgment intact, Mood & affect appropriate for pt's clinical situation. Dermatologic: No rashes or ulcers noted.  No cellulitis or open wounds.      CBC Lab Results  Component Value Date   WBC 4.7 11/13/2018   HGB 8.4 (L) 11/13/2018   HCT 27.1 (L) 11/13/2018   MCV 93.8 11/13/2018   PLT 200 11/13/2018    BMET    Component Value Date/Time   NA 135 11/08/2018 1620   K 6.2 (H) 11/08/2018 1620   CL 107 (H) 11/08/2018 1620   CO2 11 (L) 11/08/2018 1620   GLUCOSE 95 11/08/2018 1620   GLUCOSE 107 (H) 06/22/2018 0533   BUN 77 (HH) 11/08/2018 1620   CREATININE 5.00 (H) 11/08/2018 1620   CREATININE 1.93 (H) 04/05/2017 1451   CALCIUM 8.2 (L) 11/08/2018 1620   GFRNONAA 8 (L) 11/08/2018 1620   GFRAA 9 (L) 11/08/2018 1620   Estimated Creatinine Clearance: 9 mL/min (A) (by C-G formula based on SCr of 5 mg/dL (H)).  COAG Lab Results  Component Value Date   INR 0.99 06/19/2018   INR 0.97 09/24/2016    Radiology No results found.    Assessment/Plan 1.  Renal failure.  Patient has longstanding chronic kidney disease which is now felt to be end-stage renal failure and the patient requires dialysis.  We are asked to place a PermCath for immediate use for dialysis.  I have discussed this in detail with the patient today who voices her understanding.  Eventually, we should consider evaluation for AV fistula creation or AV graft creation depending on her extremity  anatomy. 2.  Hypertension.  Likely an underlying cause of her renal failure and blood pressure control important in reducing the progression of atherosclerotic disease. On appropriate oral medications. 3. Anemia.  Likely from chronic disease but does have a history of possible GI blood loss anemia in the past as well.  Nephrology will generally give Procrit with dialysis.   Leotis Pain, MD  11/16/2018 10:08 AM

## 2018-11-16 NOTE — Op Note (Signed)
OPERATIVE NOTE    PRE-OPERATIVE DIAGNOSIS: 1. ESRD   POST-OPERATIVE DIAGNOSIS: same as above  PROCEDURE: 1. Ultrasound guidance for vascular access to the right internal jugular vein 2. Fluoroscopic guidance for placement of catheter 3. Placement of a 19 cm tip to cuff tunneled hemodialysis catheter via the right internal jugular vein  SURGEON: Leotis Pain, MD  ANESTHESIA:  Local with Moderate conscious sedation for approximately 15 minutes using 4 mg of Versed and 100 mcg of Fentanyl  ESTIMATED BLOOD LOSS: 5 cc  FLUORO TIME: less than one minute  CONTRAST: none  FINDING(S): 1.  Patent right internal jugular vein  SPECIMEN(S):  None  INDICATIONS:   Amy [name] is a 68 y.o.female who presents with renal failure.  The patient needs long term dialysis access for their ESRD, and a Permcath is necessary.  Risks and benefits are discussed and informed consent is obtained.    DESCRIPTION: After obtaining full informed written consent, the patient was brought back to the vascular suited. The patient's right neck and chest were sterilely prepped and draped in a sterile surgical field was created. Moderate conscious sedation was administered during a face to face encounter with the patient throughout the procedure with my supervision of the RN administering medicines and monitoring the patient's vital signs, pulse oximetry, telemetry and mental status throughout from the start of the procedure until the patient was taken to the recovery room.  The right internal jugular vein was visualized with ultrasound and found to be patent. It was then accessed under direct ultrasound guidance and a permanent image was recorded. A wire was placed. After skin nick and dilatation, the peel-away sheath was placed over the wire. I then turned my attention to an area under the clavicle. Approximately 1-2 fingerbreadths below the clavicle a small counterincision was created and tunneled from the subclavicular  incision to the access site. Using fluoroscopic guidance, a 19 centimeter tip to cuff tunneled hemodialysis catheter was selected, and tunneled from the subclavicular incision to the access site. It was then placed through the peel-away sheath and the peel-away sheath was removed. Using fluoroscopic guidance the catheter tips were parked in the right atrium. The appropriate distal connectors were placed. It withdrew blood well and flushed easily with heparinized saline and a concentrated heparin solution was then placed. It was secured to the chest wall with 2 Prolene sutures. The access incision was closed single 4-0 Monocryl. A 4-0 Monocryl pursestring suture was placed around the exit site. Sterile dressings were placed. The patient tolerated the procedure well and was taken to the recovery room in stable condition.  COMPLICATIONS: None  CONDITION: Stable  Leotis Pain, MD 11/16/2018 10:53 AM   This note was created with Dragon Medical transcription system. Any errors in dictation are purely unintentional.

## 2018-11-17 ENCOUNTER — Telehealth (INDEPENDENT_AMBULATORY_CARE_PROVIDER_SITE_OTHER): Payer: Self-pay

## 2018-11-17 NOTE — Telephone Encounter (Signed)
Patient has been made aware of Dr Lucky Cowboy medical advice and prescription has been called into [facility]

## 2018-11-20 ENCOUNTER — Encounter: Payer: Self-pay | Admitting: Vascular Surgery

## 2018-11-21 ENCOUNTER — Emergency Department: Payer: Medicare Other

## 2018-11-21 ENCOUNTER — Other Ambulatory Visit: Payer: Self-pay

## 2018-11-21 ENCOUNTER — Encounter: Payer: Self-pay | Admitting: Intensive Care

## 2018-11-21 ENCOUNTER — Emergency Department
Admission: EM | Admit: 2018-11-21 | Discharge: 2018-11-21 | Disposition: A | Discharge status: Home / Self Care | Payer: Medicare Other | Attending: Student in an Organized Health Care Education/Training Program | Admitting: Student in an Organized Health Care Education/Training Program

## 2018-11-21 DIAGNOSIS — Y9389 Activity, other specified: Secondary | ICD-10-CM | POA: Insufficient documentation

## 2018-11-21 DIAGNOSIS — Y999 Unspecified external cause status: Secondary | ICD-10-CM | POA: Diagnosis not present

## 2018-11-21 DIAGNOSIS — I120 Hypertensive chronic kidney disease with stage 5 chronic kidney disease or end stage renal disease: Secondary | ICD-10-CM | POA: Insufficient documentation

## 2018-11-21 DIAGNOSIS — S6992XA Unspecified injury of left wrist, hand and finger(s), initial encounter: Secondary | ICD-10-CM | POA: Diagnosis not present

## 2018-11-21 DIAGNOSIS — S52392A Other fracture of shaft of radius, left arm, initial encounter for closed fracture: Secondary | ICD-10-CM | POA: Diagnosis not present

## 2018-11-21 DIAGNOSIS — Y929 Unspecified place or not applicable: Secondary | ICD-10-CM | POA: Insufficient documentation

## 2018-11-21 DIAGNOSIS — N185 Chronic kidney disease, stage 5: Secondary | ICD-10-CM | POA: Diagnosis not present

## 2018-11-21 DIAGNOSIS — S52312A Greenstick fracture of shaft of radius, left arm, initial encounter for closed fracture: Secondary | ICD-10-CM | POA: Diagnosis not present

## 2018-11-21 DIAGNOSIS — M79642 Pain in left hand: Secondary | ICD-10-CM | POA: Diagnosis not present

## 2018-11-21 DIAGNOSIS — S52225A Nondisplaced transverse fracture of shaft of left ulna, initial encounter for closed fracture: Secondary | ICD-10-CM | POA: Diagnosis not present

## 2018-11-21 DIAGNOSIS — Z23 Encounter for immunization: Secondary | ICD-10-CM | POA: Diagnosis not present

## 2018-11-21 DIAGNOSIS — S52319A Greenstick fracture of shaft of radius, unspecified arm, initial encounter for closed fracture: Secondary | ICD-10-CM | POA: Diagnosis not present

## 2018-11-21 DIAGNOSIS — Z79899 Other long term (current) drug therapy: Secondary | ICD-10-CM | POA: Insufficient documentation

## 2018-11-21 DIAGNOSIS — S59912A Unspecified injury of left forearm, initial encounter: Secondary | ICD-10-CM | POA: Diagnosis present

## 2018-11-21 DIAGNOSIS — D509 Iron deficiency anemia, unspecified: Secondary | ICD-10-CM | POA: Diagnosis not present

## 2018-11-21 DIAGNOSIS — M25532 Pain in left wrist: Secondary | ICD-10-CM | POA: Diagnosis not present

## 2018-11-21 DIAGNOSIS — E042 Nontoxic multinodular goiter: Secondary | ICD-10-CM | POA: Diagnosis not present

## 2018-11-21 DIAGNOSIS — Z992 Dependence on renal dialysis: Secondary | ICD-10-CM | POA: Diagnosis not present

## 2018-11-21 DIAGNOSIS — Z7982 Long term (current) use of aspirin: Secondary | ICD-10-CM | POA: Diagnosis not present

## 2018-11-21 DIAGNOSIS — W01198A Fall on same level from slipping, tripping and stumbling with subsequent striking against other object, initial encounter: Secondary | ICD-10-CM | POA: Diagnosis not present

## 2018-11-21 DIAGNOSIS — N186 End stage renal disease: Secondary | ICD-10-CM | POA: Diagnosis not present

## 2018-11-21 DIAGNOSIS — N2581 Secondary hyperparathyroidism of renal origin: Secondary | ICD-10-CM | POA: Diagnosis not present

## 2018-11-21 DIAGNOSIS — D631 Anemia in chronic kidney disease: Secondary | ICD-10-CM | POA: Diagnosis not present

## 2018-11-21 DIAGNOSIS — E213 Hyperparathyroidism, unspecified: Secondary | ICD-10-CM | POA: Insufficient documentation

## 2018-11-21 MED ORDER — OXYCODONE-ACETAMINOPHEN 5-325 MG PO TABS: 1.0000 | ORAL_TABLET | Freq: Three times a day (TID) | ORAL | 0 refills | Status: AC | PRN

## 2018-11-21 MED ORDER — OXYCODONE-ACETAMINOPHEN 5-325 MG PO TABS
1.0000 | ORAL_TABLET | Freq: Once | ORAL | Status: AC
  Administered 2018-11-21: 1 via ORAL
  Filled 2018-11-21: qty 1

## 2018-11-21 MED ORDER — ONDANSETRON 4 MG PO TBDP
4.0000 mg | ORAL_TABLET | Freq: Once | ORAL | Status: AC
  Administered 2018-11-21: 4 mg via ORAL
  Filled 2018-11-21: qty 1

## 2018-11-21 NOTE — ED Triage Notes (Signed)
Patient was trying to close the back hitch to her SUV and reports falling backwards catching herself with her left arm. States "I think I broke my left arm"

## 2018-11-21 NOTE — ED Provider Notes (Signed)
[facility] Emergency Department Provider Note ____________________________________________  Time seen: 1705  I have reviewed the triage vital signs and the nursing notes.  HISTORY  Chief Complaint  Arm Pain (left)  HPI Amy [name] is a 68 y.o. female presents herself to the ED from home, for injuries following a mechanical fall.  Patient describes she was attempting to close the back hitch of her SUV, when she apparently slipped and fell backwards.  She describes landing on an outstretched left arm, and had immediate pain to the middle of the forearm.  She presents today for further evaluation, concerns for a possible arm fracture.  She denies any head injury, loss of consciousness, nausea, vomiting, or dizziness.  She does report some swelling to the hand distally.  Past Medical History:  Diagnosis Date  . Anemia    a. 08/2016 Guaiac + stool. EGD w/ gastritis.  . Anxiety   . CKD (chronic kidney disease), stage III ([place])   . Colon polyp    a. 08/2016 Colonoscopy.  . Gastritis    a. 08/2016 EGD: Gastritis-->PPI.  [name] GERD (gastroesophageal reflux disease)   . History of chicken pox   . History of measles as a child   . Hypertension   . Hyponatremia    a. 08/2016 in setting of HCTZ Rx.  . Multiple thyroid nodules     Patient Active Problem List   Diagnosis Date Noted  . Itching 11/08/2018  . ARF (acute renal failure) ([place]) 06/19/2018  . Strain of knee 11/24/2017  . Hyperparathyroidism, secondary renal ([place]) 09/13/2017  . Ground glass opacity present on imaging of lung 04/08/2017  . Mesenteric lymphadenopathy 04/08/2017  . Osteoarthritis of knee 12/29/2016  . Normocytic anemia 10/14/2016  . Anemia in chronic renal disease 10/10/2016  . Leg swelling   . Intractable episodic headache   . Gastritis   . History of adenomatous polyp of colon 09/07/2016  . Benign neoplasm of cecum   . Rectal polyp   . Abdominal pain 09/05/2016  . Iron deficiency anemia  09/05/2016  . Shoulder pain 05/13/2016  . CKD (chronic kidney disease) stage 5, GFR less than 15 ml/min ([facility]) 02/05/2016  . Chronic headache 12/11/2015  . Multiple thyroid nodules 07/04/2015  . Right thyroid nodule 06/27/2015  . Allergic rhinitis, seasonal 12/03/2014  . Fever blister 12/03/2014  . Herniated lumbar intervertebral disc 12/03/2014  . Recurrent sinus infections 12/03/2014  . Insomnia 11/05/2014  . Acquired scoliosis 06/26/2014  . Intervertebral disc stenosis of neural canal of lumbar region 06/20/2014  . Lumbar facet arthropathy 05/21/2014  . Degenerative spondylolisthesis 03/12/2014  . OP (osteoporosis) 08/10/2013  . Idiopathic peripheral neuropathy 03/24/2009  . Edema 12/27/2007  . Headache, tension-type 10/25/2006  . Hypertension 10/24/2006  . Anxiety 05/03/1998  . Esophageal reflux 05/03/1998    Past Surgical History:  Procedure Laterality Date  . ABDOMINAL HYSTERECTOMY  2003   due to heavy periods and clotting during menses  . BREAST LUMPECTOMY Left 1990  . CHOLECYSTECTOMY  2010  . COLONOSCOPY WITH PROPOFOL N/A 09/07/2016   Procedure: COLONOSCOPY WITH PROPOFOL;  Surgeon: Lucilla Lame, MD;  Location: [facility] ENDOSCOPY;  Service: Endoscopy;  Laterality: N/A;  . DIALYSIS/PERMA CATHETER INSERTION N/A 11/16/2018   Procedure: DIALYSIS/PERMA CATHETER INSERTION;  Surgeon: Algernon Huxley, MD;  Location: [facility];  Service: Cardiovascular;  Laterality: N/A;  . DILATION AND CURETTAGE OF UTERUS  1985  . ESOPHAGOGASTRODUODENOSCOPY (EGD) WITH PROPOFOL N/A 09/07/2016   Procedure: ESOPHAGOGASTRODUODENOSCOPY (EGD) WITH PROPOFOL;  Surgeon: Lucilla Lame, MD;  Location: [facility] ENDOSCOPY;  Service: Endoscopy;  Laterality: N/A;  . GALLBLADDER SURGERY  2012  . KNEE SURGERY     Dr. Tamala Julian  . MRI, Lumbar spine  07/23/2009   Multilevel annular bulge and disc protrusion at L2-L3, L3-L4, L4-L5, and L5-S1  . TONSILLECTOMY    . TONSILLECTOMY AND ADENOIDECTOMY  1970  . UPPER GI  ENDOSCOPY  03/02/2006   H. Pylori negative; Dr. Allen Norris    Prior to Admission medications   Medication Sig Start Date End Date Taking? Authorizing Provider  amLODipine (NORVASC) 5 MG tablet Take 2 tablets (10 mg total) by mouth daily. 06/22/18   Mayo, Pete Pelt, MD  amoxicillin-clavulanate (AUGMENTIN) 875-125 MG tablet TAKE 1 TABLET BY MOUTH TWICE DAILY FOR 10 DAYS 11/16/18   [name], MD  aspirin EC 81 MG EC tablet Take 1 tablet (81 mg total) by mouth daily. 09/27/16   Dustin Flock, MD  buPROPion (WELLBUTRIN) 75 MG tablet Take 1 tablet (75 mg total) by mouth 2 (two) times daily. 08/21/18   [name], MD  butalbital-acetaminophen-caffeine (FIORICET) [phone] MG tablet TAKE 1 TO 2 TABLETS BY MOUTH EVERY 4 HOURS AS NEEDED FOR HEADACHE 10/20/18   [name], MD  carvedilol (COREG) 25 MG tablet TAKE 1 TABLET(25 MG) BY MOUTH TWICE DAILY WITH A MEAL 06/10/18   [name], MD  cloNIDine (CATAPRES) 0.3 MG tablet TAKE 1 TABLET(0.3 MG) BY MOUTH EVERY 8 HOURS 07/02/18   [name], MD  doxepin (SINEQUAN) 10 MG capsule Take 1 capsule (10 mg total) by mouth 4 (four) times daily as needed. 10/12/18   Mar Daring, PA-C  hydrALAZINE (APRESOLINE) 100 MG tablet Take 1 tablet (100 mg total) by mouth every 8 (eight) hours as needed. 10/06/18   [name], MD  LORazepam (ATIVAN) 1 MG tablet TAKE 1 TABLET(1 MG) BY MOUTH TWICE DAILY AS NEEDED 09/14/18   [name], MD  metolazone (ZAROXOLYN) 2.5 MG tablet Take 1 tablet (2.5 mg total) by mouth daily. 11/08/18   Bacigalupo, Dionne Bucy, MD  oxyCODONE-acetaminophen (PERCOCET) 5-325 MG tablet Take 1 tablet by mouth 3 (three) times daily as needed for up to 5 days for severe pain. 11/21/18 11/26/18  Euline Kimbler, Dannielle Karvonen, PA-C  quinapril (ACCUPRIL) 5 MG tablet Take 10 mg by mouth daily.     [provider]  sevelamer carbonate (RENVELA) 800 MG tablet Take 1 tablet (800 mg total) by mouth 3 (three) times daily with meals. 11/10/18    Virginia Crews, MD  torsemide (DEMADEX) 20 MG tablet Take 40 mg by mouth every morning.    [provider]  valACYclovir (VALTREX) 1000 MG tablet TAKE 2 TABLETS BY MOUTH TWICE DAILY FOR 1 DAY AS NEEDED FOR COLD SORES 06/30/18   [name], MD  zolpidem (AMBIEN) 10 MG tablet TAKE 1/2 TO 1 TABLET(5 TO 10 MG) BY MOUTH AT BEDTIME AS NEEDED 10/24/18   [name], MD    Allergies Cephalosporins, Clarithromycin, Lunesta  [eszopiclone], Penicillin v, and Sulfa antibiotics  Family History  Problem Relation Age of Onset  . Pancreatic cancer Father   . Congestive Heart Failure Maternal Grandmother   . Cancer Paternal Grandfather   . Heart disease Maternal Uncle     Social History Social History   Tobacco Use  . Smoking status: Never Smoker  . Smokeless tobacco: Never Used  Substance Use Topics  . Alcohol use: Yes  Alcohol/week: 0.0 - 2.0 standard drinks  . Drug use: No    Review of Systems  Constitutional: Negative for fever. Eyes: Negative for visual changes. ENT: Negative for sore throat. Cardiovascular: Negative for chest pain. Respiratory: Negative for shortness of breath. Gastrointestinal: Negative for abdominal pain, vomiting and diarrhea. Genitourinary: Negative for dysuria. Musculoskeletal: Negative for back pain.  Left forearm pain as above. Skin: Negative for rash. Neurological: Negative for headaches, focal weakness or numbness. ____________________________________________  PHYSICAL EXAM:  VITAL SIGNS: ED Triage Vitals  Enc Vitals Group     BP 11/21/18 1625 (!) 191/86     Pulse Rate 11/21/18 1625 92     Resp 11/21/18 1625 16     Temp 11/21/18 1625 98.7 F (37.1 C)     Temp Source 11/21/18 1625 Oral     SpO2 11/21/18 1625 97 %     Weight 11/21/18 1626 112 lb (50.8 kg)     Height 11/21/18 1626 5\' 4"  (1.626 m)     Head Circumference --      Peak Flow --      Pain Score 11/21/18 1626 7     Pain Loc --      Pain Edu? --      Excl.  in [place]? --     Constitutional: Alert and oriented. Well appearing and in no distress. Head: Normocephalic and atraumatic. Eyes: Conjunctivae are normal. Normal extraocular movements Neck: Supple. Normal ROM Cardiovascular: Normal rate, regular rhythm. Normal distal pulses. Respiratory: Normal respiratory effort. No wheezes/rales/rhonchi. Gastrointestinal: Soft and nontender. No distention. Musculoskeletal: Left forearm without any obvious deformity or dislocation.  There is subtle soft tissue swelling to the middle of the forearm as well as some distal swelling and ecchymosis.  Patient with normal composite fist distally.  She is tender to palpation over the mid left forearm.  Nontender with normal range of motion in all extremities.  Neurologic: Cranial nerves II through XII grossly intact.  Normal intrinsic and opposition testing noted.  Normal gait without ataxia. Normal speech and language. No gross focal neurologic deficits are appreciated. Skin:  Skin is warm, dry and intact. No rash noted. Psychiatric: Mood and affect are normal. Patient exhibits appropriate insight and judgment. ____________________________________________   RADIOLOGY  DG Left Forearm IMPRESSION: Nondisplaced distal LEFT ulnar diaphyseal fracture  DG Left Hand Negative  DG Left Wrist Negative   I, Keylon Labelle V Bacon-Callahan Wild, personally viewed and evaluated these images (plain radiographs) as part of my medical decision making, as well as reviewing the written report by the radiologist. ____________________________________________  PROCEDURES Percocet 5-325 mg PO Zofran 4 mg ODT  .Splint Application  Date/Time: 11/21/2018 5:47 PM Performed by: Loma Boston, NT Authorized by: Melvenia Needles, PA-C   Consent:    Consent obtained:  Verbal   Consent given by:  Patient   Risks discussed:  Pain   Alternatives discussed:  Alternative treatment Pre-procedure details:    Sensation:  Normal Procedure  details:    Laterality:  Left   Location:  Arm   Arm:  L lower arm   Splint type:  Long arm   Supplies:  Elastic bandage, Ortho-Glass, cotton padding and sling Post-procedure details:    Pain:  Improved   Sensation:  Normal   Patient tolerance of procedure:  Tolerated well, no immediate complications  ____________________________________________  INITIAL IMPRESSION / ASSESSMENT AND PLAN / ED COURSE  Amy [name] was evaluated in Emergency Department on 11/22/2018 for the symptoms described in the  history of present illness. She was evaluated in the context of the global COVID-19 pandemic, which necessitated consideration that the patient might be at risk for infection with the SARS-CoV-2 virus that causes COVID-19. Institutional protocols and algorithms that pertain to the evaluation of patients at risk for COVID-19 are in a state of rapid change based on information released by regulatory bodies including the CDC and federal and state organizations. These policies and algorithms were followed during the patient's care in the ED.  Patient with ED evaluation initial management of a closed nondisplaced fracture to the left radius.  Patient's been placed in appropriate splint for comfort as well as a sling.  A prescription for oxycodone was provided for pain relief.  She is referred to orthopedics for further fracture care.  Return precautions have been reviewed.  I reviewed the patient's prescription history over the last 12 months in the multi-state controlled substances database(s) that includes [place], [place], [place], [place], [place], [place], [place], [place], [place], [place], [place], [place], [place], and [place].  Results were notable for recurrent BZD prescriptions.  ____________________________________________  FINAL CLINICAL IMPRESSION(S) / ED DIAGNOSES  Final diagnoses:  Greenstick fracture of shaft of radius, left arm, initial encounter  for closed fracture  Other closed fracture of shaft of left radius, initial encounter      Carmie End, Dannielle Karvonen, PA-C 11/22/18 2110    Merlyn Lot, MD 11/22/18 2115

## 2018-11-21 NOTE — Discharge Instructions (Signed)
You have a non-displaced fracture to the forearm. You will wear the splint until you are evaluated by Ortho. Take the prescription pain medicine as needed.

## 2018-11-23 DIAGNOSIS — N186 End stage renal disease: Secondary | ICD-10-CM | POA: Diagnosis not present

## 2018-11-23 DIAGNOSIS — Z992 Dependence on renal dialysis: Secondary | ICD-10-CM | POA: Diagnosis not present

## 2018-11-23 DIAGNOSIS — D509 Iron deficiency anemia, unspecified: Secondary | ICD-10-CM | POA: Diagnosis not present

## 2018-11-23 DIAGNOSIS — N2581 Secondary hyperparathyroidism of renal origin: Secondary | ICD-10-CM | POA: Diagnosis not present

## 2018-11-23 DIAGNOSIS — Z23 Encounter for immunization: Secondary | ICD-10-CM | POA: Diagnosis not present

## 2018-11-23 DIAGNOSIS — D631 Anemia in chronic kidney disease: Secondary | ICD-10-CM | POA: Diagnosis not present

## 2018-11-25 ENCOUNTER — Other Ambulatory Visit: Payer: Self-pay | Admitting: Family Medicine

## 2018-11-25 DIAGNOSIS — D509 Iron deficiency anemia, unspecified: Secondary | ICD-10-CM | POA: Diagnosis not present

## 2018-11-25 DIAGNOSIS — Z992 Dependence on renal dialysis: Secondary | ICD-10-CM | POA: Diagnosis not present

## 2018-11-25 DIAGNOSIS — N2581 Secondary hyperparathyroidism of renal origin: Secondary | ICD-10-CM | POA: Diagnosis not present

## 2018-11-25 DIAGNOSIS — N186 End stage renal disease: Secondary | ICD-10-CM | POA: Diagnosis not present

## 2018-11-25 DIAGNOSIS — Z23 Encounter for immunization: Secondary | ICD-10-CM | POA: Diagnosis not present

## 2018-11-25 DIAGNOSIS — D631 Anemia in chronic kidney disease: Secondary | ICD-10-CM | POA: Diagnosis not present

## 2018-11-25 IMAGING — CR DG CHEST 2V
1 series · 2 of 2 positions shown · non-contrast
Comparison: None.

CLINICAL DATA: Chest pain.

EXAM:
CHEST  2 VIEW

[Series 1: w chest pa · 0.14mm/px · 2 of 2 slices shown]
[im 1/2]
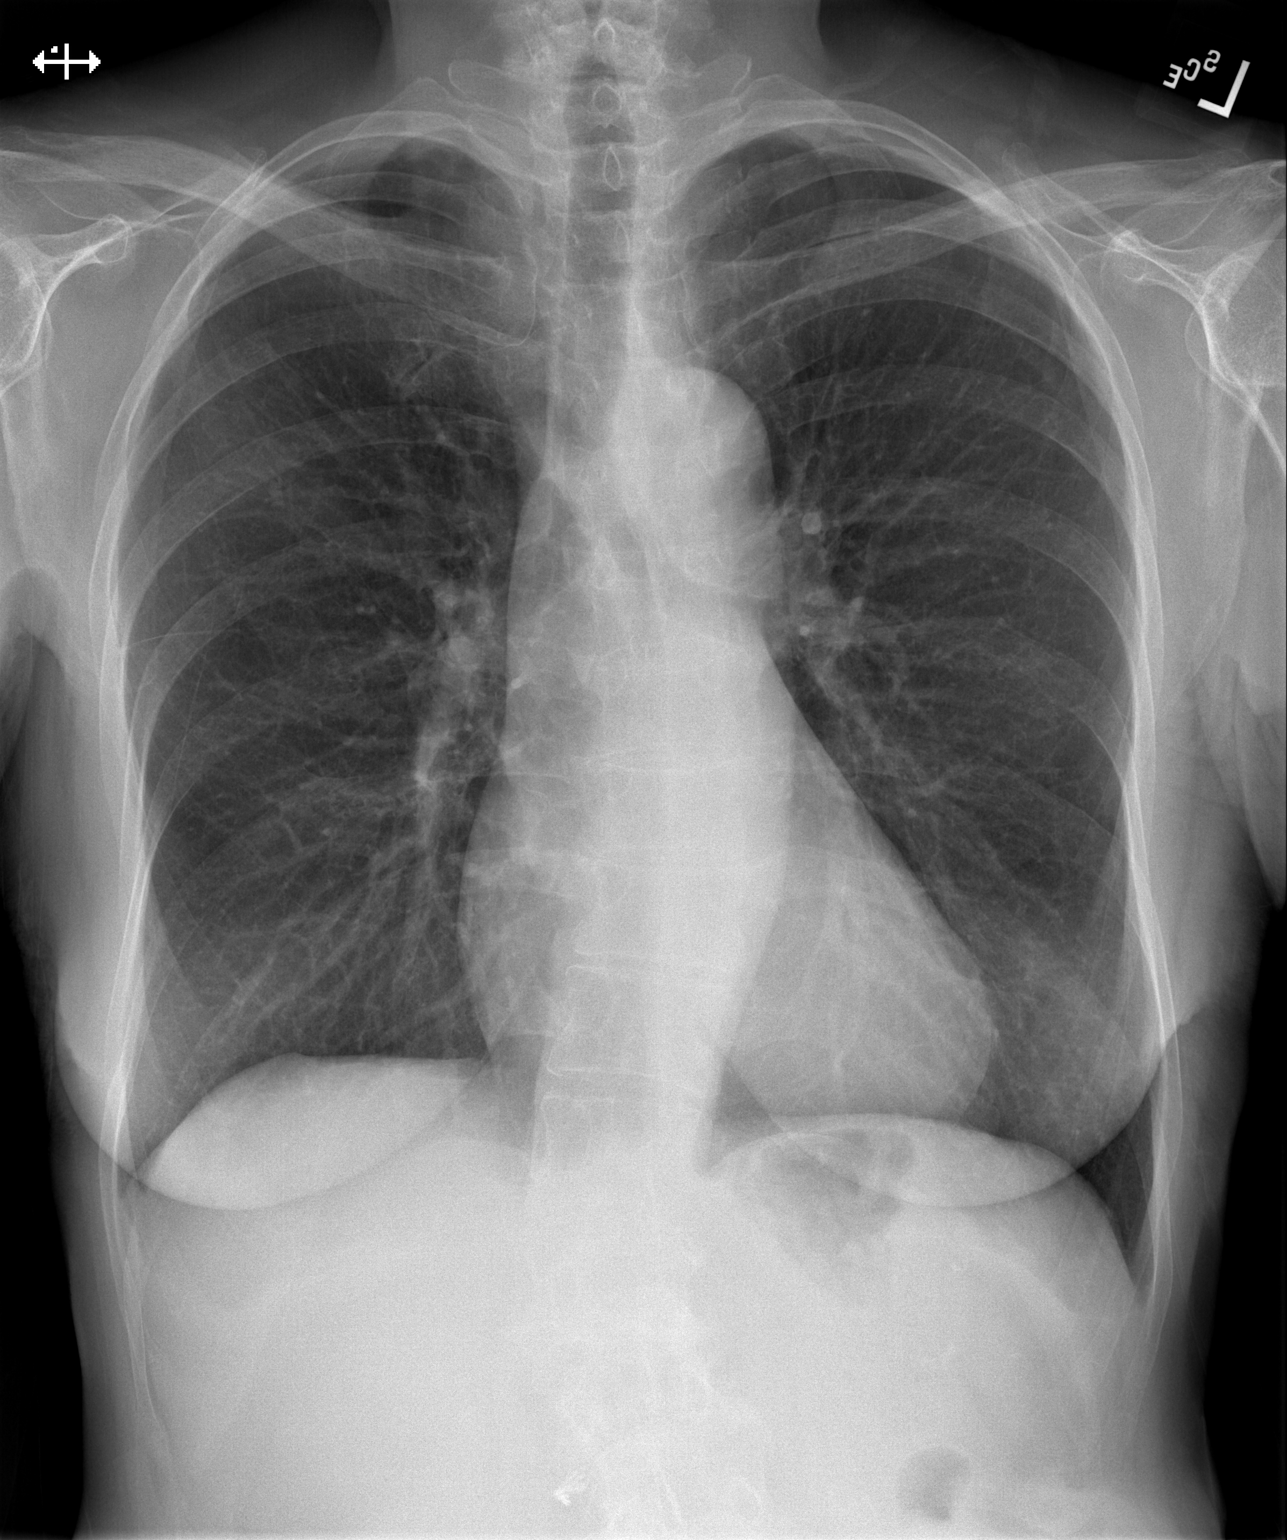
[im 2/2]
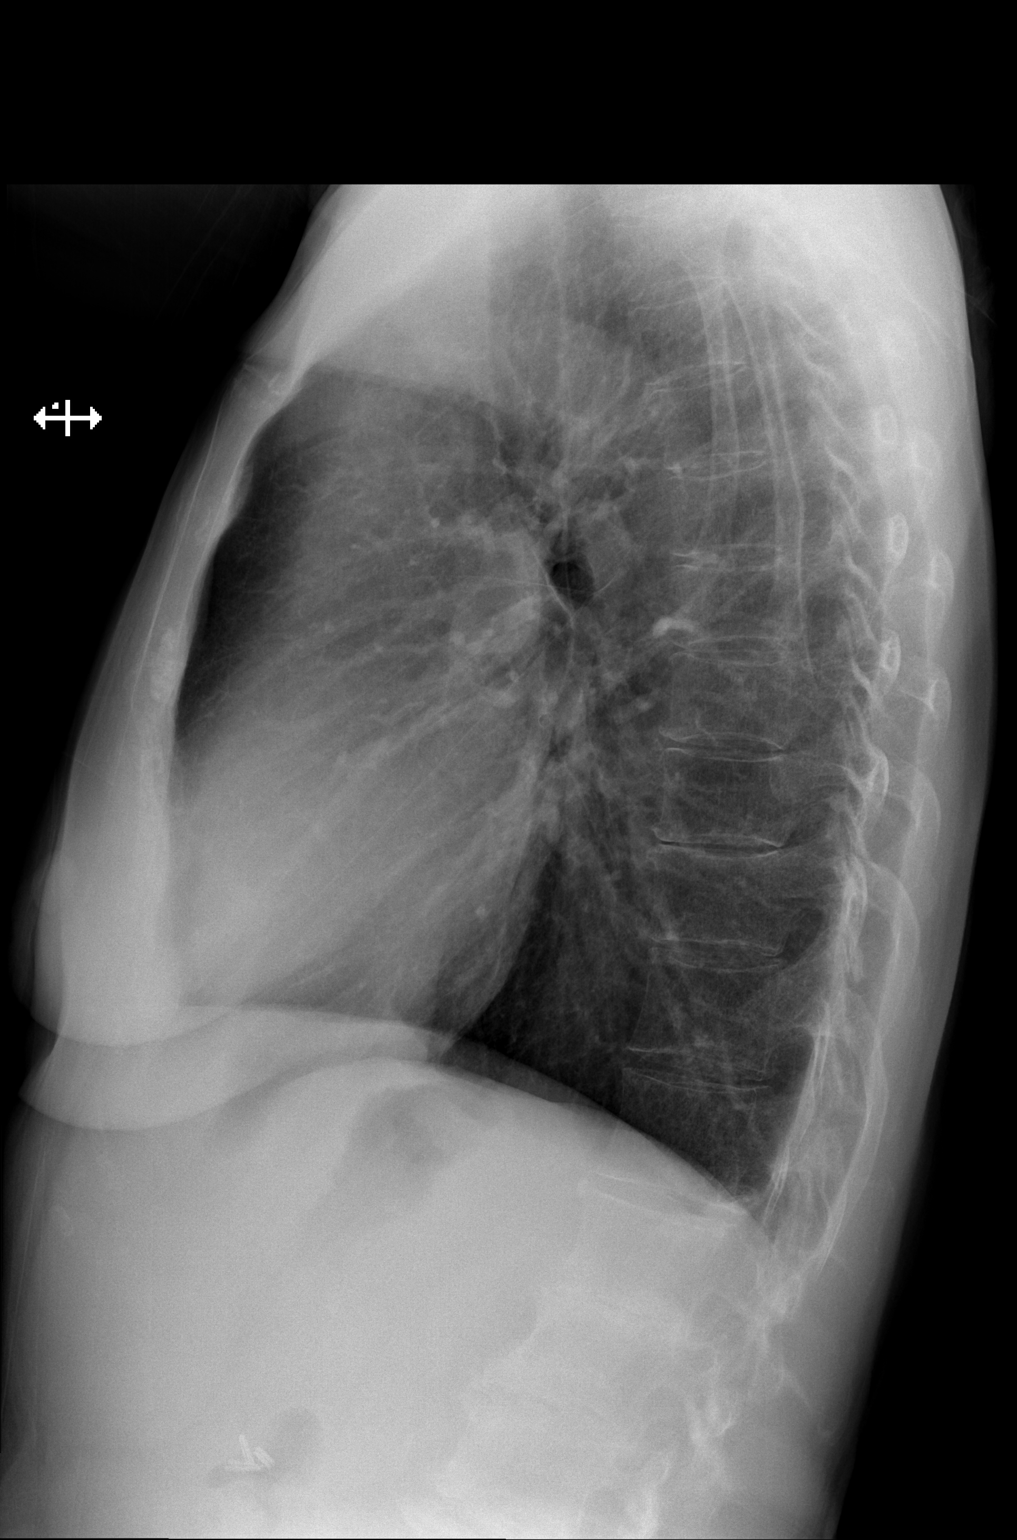

[2 of 2 positions shown; findings below may reference images not displayed]

FINDINGS: Upper limits normal heart size noted.

There is no evidence of focal airspace disease, pulmonary edema,
suspicious pulmonary nodule/mass, pleural effusion, or pneumothorax.
No acute bony abnormalities are identified.
IMPRESSION: No active cardiopulmonary disease.

## 2018-11-27 DIAGNOSIS — S52202A Unspecified fracture of shaft of left ulna, initial encounter for closed fracture: Secondary | ICD-10-CM | POA: Insufficient documentation

## 2018-11-27 DIAGNOSIS — S52225A Nondisplaced transverse fracture of shaft of left ulna, initial encounter for closed fracture: Secondary | ICD-10-CM | POA: Diagnosis not present

## 2018-11-27 HISTORY — DX: Unspecified fracture of shaft of left ulna, initial encounter for closed fracture: S52.202A

## 2018-11-28 ENCOUNTER — Other Ambulatory Visit (INDEPENDENT_AMBULATORY_CARE_PROVIDER_SITE_OTHER): Payer: Self-pay | Admitting: Vascular Surgery

## 2018-11-28 DIAGNOSIS — D509 Iron deficiency anemia, unspecified: Secondary | ICD-10-CM | POA: Diagnosis not present

## 2018-11-28 DIAGNOSIS — N2581 Secondary hyperparathyroidism of renal origin: Secondary | ICD-10-CM | POA: Diagnosis not present

## 2018-11-28 DIAGNOSIS — N186 End stage renal disease: Secondary | ICD-10-CM | POA: Diagnosis not present

## 2018-11-28 DIAGNOSIS — Z23 Encounter for immunization: Secondary | ICD-10-CM | POA: Diagnosis not present

## 2018-11-28 DIAGNOSIS — D631 Anemia in chronic kidney disease: Secondary | ICD-10-CM | POA: Diagnosis not present

## 2018-11-28 DIAGNOSIS — Z992 Dependence on renal dialysis: Secondary | ICD-10-CM | POA: Diagnosis not present

## 2018-11-29 ENCOUNTER — Encounter (INDEPENDENT_AMBULATORY_CARE_PROVIDER_SITE_OTHER): Payer: Medicare Other

## 2018-11-29 ENCOUNTER — Other Ambulatory Visit (INDEPENDENT_AMBULATORY_CARE_PROVIDER_SITE_OTHER): Payer: Medicare Other

## 2018-11-29 ENCOUNTER — Ambulatory Visit (INDEPENDENT_AMBULATORY_CARE_PROVIDER_SITE_OTHER): Payer: Medicare Other | Admitting: Nurse Practitioner

## 2018-11-29 ENCOUNTER — Ambulatory Visit: Payer: Self-pay | Admitting: Family Medicine

## 2018-11-30 DIAGNOSIS — D631 Anemia in chronic kidney disease: Secondary | ICD-10-CM | POA: Diagnosis not present

## 2018-11-30 DIAGNOSIS — D509 Iron deficiency anemia, unspecified: Secondary | ICD-10-CM | POA: Diagnosis not present

## 2018-11-30 DIAGNOSIS — N186 End stage renal disease: Secondary | ICD-10-CM | POA: Diagnosis not present

## 2018-11-30 DIAGNOSIS — N2581 Secondary hyperparathyroidism of renal origin: Secondary | ICD-10-CM | POA: Diagnosis not present

## 2018-11-30 DIAGNOSIS — Z992 Dependence on renal dialysis: Secondary | ICD-10-CM | POA: Diagnosis not present

## 2018-11-30 DIAGNOSIS — Z23 Encounter for immunization: Secondary | ICD-10-CM | POA: Diagnosis not present

## 2018-12-01 DIAGNOSIS — N186 End stage renal disease: Secondary | ICD-10-CM | POA: Diagnosis not present

## 2018-12-01 DIAGNOSIS — Z992 Dependence on renal dialysis: Secondary | ICD-10-CM | POA: Diagnosis not present

## 2018-12-02 DIAGNOSIS — D509 Iron deficiency anemia, unspecified: Secondary | ICD-10-CM | POA: Diagnosis not present

## 2018-12-02 DIAGNOSIS — N186 End stage renal disease: Secondary | ICD-10-CM | POA: Diagnosis not present

## 2018-12-02 DIAGNOSIS — Z992 Dependence on renal dialysis: Secondary | ICD-10-CM | POA: Diagnosis not present

## 2018-12-02 DIAGNOSIS — N2581 Secondary hyperparathyroidism of renal origin: Secondary | ICD-10-CM | POA: Diagnosis not present

## 2018-12-02 DIAGNOSIS — Z23 Encounter for immunization: Secondary | ICD-10-CM | POA: Diagnosis not present

## 2018-12-02 DIAGNOSIS — D631 Anemia in chronic kidney disease: Secondary | ICD-10-CM | POA: Diagnosis not present

## 2018-12-04 ENCOUNTER — Inpatient Hospital Stay: Payer: Medicare Other

## 2018-12-04 ENCOUNTER — Inpatient Hospital Stay: Payer: Medicare Other | Admitting: Oncology

## 2018-12-05 DIAGNOSIS — N186 End stage renal disease: Secondary | ICD-10-CM | POA: Diagnosis not present

## 2018-12-05 DIAGNOSIS — Z992 Dependence on renal dialysis: Secondary | ICD-10-CM | POA: Diagnosis not present

## 2018-12-05 DIAGNOSIS — D509 Iron deficiency anemia, unspecified: Secondary | ICD-10-CM | POA: Diagnosis not present

## 2018-12-05 DIAGNOSIS — D631 Anemia in chronic kidney disease: Secondary | ICD-10-CM | POA: Diagnosis not present

## 2018-12-05 DIAGNOSIS — N2581 Secondary hyperparathyroidism of renal origin: Secondary | ICD-10-CM | POA: Diagnosis not present

## 2018-12-05 DIAGNOSIS — Z23 Encounter for immunization: Secondary | ICD-10-CM | POA: Diagnosis not present

## 2018-12-06 DIAGNOSIS — N186 End stage renal disease: Secondary | ICD-10-CM | POA: Diagnosis not present

## 2018-12-07 DIAGNOSIS — D509 Iron deficiency anemia, unspecified: Secondary | ICD-10-CM | POA: Diagnosis not present

## 2018-12-07 DIAGNOSIS — N2581 Secondary hyperparathyroidism of renal origin: Secondary | ICD-10-CM | POA: Diagnosis not present

## 2018-12-07 DIAGNOSIS — D631 Anemia in chronic kidney disease: Secondary | ICD-10-CM | POA: Diagnosis not present

## 2018-12-07 DIAGNOSIS — Z992 Dependence on renal dialysis: Secondary | ICD-10-CM | POA: Diagnosis not present

## 2018-12-07 DIAGNOSIS — Z23 Encounter for immunization: Secondary | ICD-10-CM | POA: Diagnosis not present

## 2018-12-07 DIAGNOSIS — N186 End stage renal disease: Secondary | ICD-10-CM | POA: Diagnosis not present

## 2018-12-08 DIAGNOSIS — S52225A Nondisplaced transverse fracture of shaft of left ulna, initial encounter for closed fracture: Secondary | ICD-10-CM | POA: Diagnosis not present

## 2018-12-09 DIAGNOSIS — N2581 Secondary hyperparathyroidism of renal origin: Secondary | ICD-10-CM | POA: Diagnosis not present

## 2018-12-09 DIAGNOSIS — D509 Iron deficiency anemia, unspecified: Secondary | ICD-10-CM | POA: Diagnosis not present

## 2018-12-09 DIAGNOSIS — D631 Anemia in chronic kidney disease: Secondary | ICD-10-CM | POA: Diagnosis not present

## 2018-12-09 DIAGNOSIS — N186 End stage renal disease: Secondary | ICD-10-CM | POA: Diagnosis not present

## 2018-12-09 DIAGNOSIS — Z23 Encounter for immunization: Secondary | ICD-10-CM | POA: Diagnosis not present

## 2018-12-09 DIAGNOSIS — Z992 Dependence on renal dialysis: Secondary | ICD-10-CM | POA: Diagnosis not present

## 2018-12-11 ENCOUNTER — Other Ambulatory Visit: Payer: Self-pay | Admitting: Family Medicine

## 2018-12-11 DIAGNOSIS — L03119 Cellulitis of unspecified part of limb: Secondary | ICD-10-CM

## 2018-12-12 ENCOUNTER — Other Ambulatory Visit: Payer: Self-pay | Admitting: Family Medicine

## 2018-12-12 DIAGNOSIS — N2581 Secondary hyperparathyroidism of renal origin: Secondary | ICD-10-CM | POA: Diagnosis not present

## 2018-12-12 DIAGNOSIS — Z23 Encounter for immunization: Secondary | ICD-10-CM | POA: Diagnosis not present

## 2018-12-12 DIAGNOSIS — D509 Iron deficiency anemia, unspecified: Secondary | ICD-10-CM | POA: Diagnosis not present

## 2018-12-12 DIAGNOSIS — D631 Anemia in chronic kidney disease: Secondary | ICD-10-CM | POA: Diagnosis not present

## 2018-12-12 DIAGNOSIS — N186 End stage renal disease: Secondary | ICD-10-CM | POA: Diagnosis not present

## 2018-12-12 DIAGNOSIS — Z992 Dependence on renal dialysis: Secondary | ICD-10-CM | POA: Diagnosis not present

## 2018-12-13 DIAGNOSIS — N186 End stage renal disease: Secondary | ICD-10-CM

## 2018-12-14 DIAGNOSIS — D631 Anemia in chronic kidney disease: Secondary | ICD-10-CM | POA: Diagnosis not present

## 2018-12-14 DIAGNOSIS — N2581 Secondary hyperparathyroidism of renal origin: Secondary | ICD-10-CM | POA: Diagnosis not present

## 2018-12-14 DIAGNOSIS — D509 Iron deficiency anemia, unspecified: Secondary | ICD-10-CM | POA: Diagnosis not present

## 2018-12-14 DIAGNOSIS — N186 End stage renal disease: Secondary | ICD-10-CM | POA: Diagnosis not present

## 2018-12-14 DIAGNOSIS — Z992 Dependence on renal dialysis: Secondary | ICD-10-CM | POA: Diagnosis not present

## 2018-12-14 DIAGNOSIS — Z23 Encounter for immunization: Secondary | ICD-10-CM | POA: Diagnosis not present

## 2018-12-16 DIAGNOSIS — Z23 Encounter for immunization: Secondary | ICD-10-CM | POA: Diagnosis not present

## 2018-12-16 DIAGNOSIS — N2581 Secondary hyperparathyroidism of renal origin: Secondary | ICD-10-CM | POA: Diagnosis not present

## 2018-12-16 DIAGNOSIS — D509 Iron deficiency anemia, unspecified: Secondary | ICD-10-CM | POA: Diagnosis not present

## 2018-12-16 DIAGNOSIS — N186 End stage renal disease: Secondary | ICD-10-CM | POA: Diagnosis not present

## 2018-12-16 DIAGNOSIS — Z992 Dependence on renal dialysis: Secondary | ICD-10-CM | POA: Diagnosis not present

## 2018-12-16 DIAGNOSIS — D631 Anemia in chronic kidney disease: Secondary | ICD-10-CM | POA: Diagnosis not present

## 2018-12-18 DIAGNOSIS — M1711 Unilateral primary osteoarthritis, right knee: Secondary | ICD-10-CM | POA: Diagnosis not present

## 2018-12-18 DIAGNOSIS — S52225A Nondisplaced transverse fracture of shaft of left ulna, initial encounter for closed fracture: Secondary | ICD-10-CM | POA: Diagnosis not present

## 2018-12-19 DIAGNOSIS — D631 Anemia in chronic kidney disease: Secondary | ICD-10-CM | POA: Diagnosis not present

## 2018-12-19 DIAGNOSIS — D509 Iron deficiency anemia, unspecified: Secondary | ICD-10-CM | POA: Diagnosis not present

## 2018-12-19 DIAGNOSIS — N2581 Secondary hyperparathyroidism of renal origin: Secondary | ICD-10-CM | POA: Diagnosis not present

## 2018-12-19 DIAGNOSIS — Z992 Dependence on renal dialysis: Secondary | ICD-10-CM | POA: Diagnosis not present

## 2018-12-19 DIAGNOSIS — N186 End stage renal disease: Secondary | ICD-10-CM | POA: Diagnosis not present

## 2018-12-19 DIAGNOSIS — Z23 Encounter for immunization: Secondary | ICD-10-CM | POA: Diagnosis not present

## 2018-12-21 DIAGNOSIS — Z23 Encounter for immunization: Secondary | ICD-10-CM | POA: Diagnosis not present

## 2018-12-21 DIAGNOSIS — D631 Anemia in chronic kidney disease: Secondary | ICD-10-CM | POA: Diagnosis not present

## 2018-12-21 DIAGNOSIS — D509 Iron deficiency anemia, unspecified: Secondary | ICD-10-CM | POA: Diagnosis not present

## 2018-12-21 DIAGNOSIS — Z992 Dependence on renal dialysis: Secondary | ICD-10-CM | POA: Diagnosis not present

## 2018-12-21 DIAGNOSIS — N186 End stage renal disease: Secondary | ICD-10-CM | POA: Diagnosis not present

## 2018-12-21 DIAGNOSIS — N2581 Secondary hyperparathyroidism of renal origin: Secondary | ICD-10-CM | POA: Diagnosis not present

## 2018-12-23 DIAGNOSIS — N2581 Secondary hyperparathyroidism of renal origin: Secondary | ICD-10-CM | POA: Diagnosis not present

## 2018-12-23 DIAGNOSIS — Z23 Encounter for immunization: Secondary | ICD-10-CM | POA: Diagnosis not present

## 2018-12-23 DIAGNOSIS — Z992 Dependence on renal dialysis: Secondary | ICD-10-CM | POA: Diagnosis not present

## 2018-12-23 DIAGNOSIS — D509 Iron deficiency anemia, unspecified: Secondary | ICD-10-CM | POA: Diagnosis not present

## 2018-12-23 DIAGNOSIS — N186 End stage renal disease: Secondary | ICD-10-CM | POA: Diagnosis not present

## 2018-12-23 DIAGNOSIS — D631 Anemia in chronic kidney disease: Secondary | ICD-10-CM | POA: Diagnosis not present

## 2018-12-26 DIAGNOSIS — D631 Anemia in chronic kidney disease: Secondary | ICD-10-CM | POA: Diagnosis not present

## 2018-12-26 DIAGNOSIS — Z992 Dependence on renal dialysis: Secondary | ICD-10-CM | POA: Diagnosis not present

## 2018-12-26 DIAGNOSIS — N186 End stage renal disease: Secondary | ICD-10-CM | POA: Diagnosis not present

## 2018-12-26 DIAGNOSIS — D509 Iron deficiency anemia, unspecified: Secondary | ICD-10-CM | POA: Diagnosis not present

## 2018-12-26 DIAGNOSIS — Z23 Encounter for immunization: Secondary | ICD-10-CM | POA: Diagnosis not present

## 2018-12-26 DIAGNOSIS — N2581 Secondary hyperparathyroidism of renal origin: Secondary | ICD-10-CM | POA: Diagnosis not present

## 2018-12-27 DIAGNOSIS — N186 End stage renal disease: Secondary | ICD-10-CM | POA: Diagnosis not present

## 2018-12-27 DIAGNOSIS — I120 Hypertensive chronic kidney disease with stage 5 chronic kidney disease or end stage renal disease: Secondary | ICD-10-CM | POA: Diagnosis not present

## 2018-12-28 DIAGNOSIS — N186 End stage renal disease: Secondary | ICD-10-CM | POA: Diagnosis not present

## 2018-12-28 DIAGNOSIS — N2581 Secondary hyperparathyroidism of renal origin: Secondary | ICD-10-CM | POA: Diagnosis not present

## 2018-12-28 DIAGNOSIS — Z23 Encounter for immunization: Secondary | ICD-10-CM | POA: Diagnosis not present

## 2018-12-28 DIAGNOSIS — D631 Anemia in chronic kidney disease: Secondary | ICD-10-CM | POA: Diagnosis not present

## 2018-12-28 DIAGNOSIS — D509 Iron deficiency anemia, unspecified: Secondary | ICD-10-CM | POA: Diagnosis not present

## 2018-12-28 DIAGNOSIS — Z992 Dependence on renal dialysis: Secondary | ICD-10-CM | POA: Diagnosis not present

## 2018-12-29 ENCOUNTER — Other Ambulatory Visit: Payer: Self-pay | Admitting: Family Medicine

## 2018-12-29 DIAGNOSIS — G8929 Other chronic pain: Secondary | ICD-10-CM

## 2018-12-30 DIAGNOSIS — D509 Iron deficiency anemia, unspecified: Secondary | ICD-10-CM | POA: Diagnosis not present

## 2018-12-30 DIAGNOSIS — N2581 Secondary hyperparathyroidism of renal origin: Secondary | ICD-10-CM | POA: Diagnosis not present

## 2018-12-30 DIAGNOSIS — D631 Anemia in chronic kidney disease: Secondary | ICD-10-CM | POA: Diagnosis not present

## 2018-12-30 DIAGNOSIS — N186 End stage renal disease: Secondary | ICD-10-CM | POA: Diagnosis not present

## 2018-12-30 DIAGNOSIS — Z23 Encounter for immunization: Secondary | ICD-10-CM | POA: Diagnosis not present

## 2018-12-30 DIAGNOSIS — Z992 Dependence on renal dialysis: Secondary | ICD-10-CM | POA: Diagnosis not present

## 2019-01-01 DIAGNOSIS — Z992 Dependence on renal dialysis: Secondary | ICD-10-CM | POA: Diagnosis not present

## 2019-01-01 DIAGNOSIS — N186 End stage renal disease: Secondary | ICD-10-CM | POA: Diagnosis not present

## 2019-01-02 DIAGNOSIS — N186 End stage renal disease: Secondary | ICD-10-CM | POA: Diagnosis not present

## 2019-01-02 DIAGNOSIS — Z992 Dependence on renal dialysis: Secondary | ICD-10-CM | POA: Diagnosis not present

## 2019-01-02 DIAGNOSIS — Z23 Encounter for immunization: Secondary | ICD-10-CM | POA: Diagnosis not present

## 2019-01-02 DIAGNOSIS — D631 Anemia in chronic kidney disease: Secondary | ICD-10-CM | POA: Diagnosis not present

## 2019-01-02 DIAGNOSIS — D509 Iron deficiency anemia, unspecified: Secondary | ICD-10-CM | POA: Diagnosis not present

## 2019-01-02 DIAGNOSIS — N2581 Secondary hyperparathyroidism of renal origin: Secondary | ICD-10-CM | POA: Diagnosis not present

## 2019-01-04 DIAGNOSIS — N2581 Secondary hyperparathyroidism of renal origin: Secondary | ICD-10-CM | POA: Diagnosis not present

## 2019-01-04 DIAGNOSIS — D509 Iron deficiency anemia, unspecified: Secondary | ICD-10-CM | POA: Diagnosis not present

## 2019-01-04 DIAGNOSIS — Z23 Encounter for immunization: Secondary | ICD-10-CM | POA: Diagnosis not present

## 2019-01-04 DIAGNOSIS — D631 Anemia in chronic kidney disease: Secondary | ICD-10-CM | POA: Diagnosis not present

## 2019-01-04 DIAGNOSIS — N186 End stage renal disease: Secondary | ICD-10-CM | POA: Diagnosis not present

## 2019-01-04 DIAGNOSIS — Z992 Dependence on renal dialysis: Secondary | ICD-10-CM | POA: Diagnosis not present

## 2019-01-06 DIAGNOSIS — Z23 Encounter for immunization: Secondary | ICD-10-CM | POA: Diagnosis not present

## 2019-01-06 DIAGNOSIS — N2581 Secondary hyperparathyroidism of renal origin: Secondary | ICD-10-CM | POA: Diagnosis not present

## 2019-01-06 DIAGNOSIS — D631 Anemia in chronic kidney disease: Secondary | ICD-10-CM | POA: Diagnosis not present

## 2019-01-06 DIAGNOSIS — Z992 Dependence on renal dialysis: Secondary | ICD-10-CM | POA: Diagnosis not present

## 2019-01-06 DIAGNOSIS — N186 End stage renal disease: Secondary | ICD-10-CM | POA: Diagnosis not present

## 2019-01-06 DIAGNOSIS — D509 Iron deficiency anemia, unspecified: Secondary | ICD-10-CM | POA: Diagnosis not present

## 2019-01-09 DIAGNOSIS — N186 End stage renal disease: Secondary | ICD-10-CM | POA: Diagnosis not present

## 2019-01-09 DIAGNOSIS — D509 Iron deficiency anemia, unspecified: Secondary | ICD-10-CM | POA: Diagnosis not present

## 2019-01-09 DIAGNOSIS — D631 Anemia in chronic kidney disease: Secondary | ICD-10-CM | POA: Diagnosis not present

## 2019-01-09 DIAGNOSIS — Z23 Encounter for immunization: Secondary | ICD-10-CM | POA: Diagnosis not present

## 2019-01-09 DIAGNOSIS — N2581 Secondary hyperparathyroidism of renal origin: Secondary | ICD-10-CM | POA: Diagnosis not present

## 2019-01-09 DIAGNOSIS — Z992 Dependence on renal dialysis: Secondary | ICD-10-CM | POA: Diagnosis not present

## 2019-01-11 DIAGNOSIS — D509 Iron deficiency anemia, unspecified: Secondary | ICD-10-CM | POA: Diagnosis not present

## 2019-01-11 DIAGNOSIS — N2581 Secondary hyperparathyroidism of renal origin: Secondary | ICD-10-CM | POA: Diagnosis not present

## 2019-01-11 DIAGNOSIS — D631 Anemia in chronic kidney disease: Secondary | ICD-10-CM | POA: Diagnosis not present

## 2019-01-11 DIAGNOSIS — Z23 Encounter for immunization: Secondary | ICD-10-CM | POA: Diagnosis not present

## 2019-01-11 DIAGNOSIS — N186 End stage renal disease: Secondary | ICD-10-CM | POA: Diagnosis not present

## 2019-01-11 DIAGNOSIS — Z992 Dependence on renal dialysis: Secondary | ICD-10-CM | POA: Diagnosis not present

## 2019-01-12 ENCOUNTER — Other Ambulatory Visit: Payer: Self-pay | Admitting: Family Medicine

## 2019-01-12 DIAGNOSIS — L03119 Cellulitis of unspecified part of limb: Secondary | ICD-10-CM

## 2019-01-13 DIAGNOSIS — D631 Anemia in chronic kidney disease: Secondary | ICD-10-CM | POA: Diagnosis not present

## 2019-01-13 DIAGNOSIS — Z23 Encounter for immunization: Secondary | ICD-10-CM | POA: Diagnosis not present

## 2019-01-13 DIAGNOSIS — N186 End stage renal disease: Secondary | ICD-10-CM | POA: Diagnosis not present

## 2019-01-13 DIAGNOSIS — N2581 Secondary hyperparathyroidism of renal origin: Secondary | ICD-10-CM | POA: Diagnosis not present

## 2019-01-13 DIAGNOSIS — D509 Iron deficiency anemia, unspecified: Secondary | ICD-10-CM | POA: Diagnosis not present

## 2019-01-13 DIAGNOSIS — Z992 Dependence on renal dialysis: Secondary | ICD-10-CM | POA: Diagnosis not present

## 2019-01-16 DIAGNOSIS — Z23 Encounter for immunization: Secondary | ICD-10-CM | POA: Diagnosis not present

## 2019-01-16 DIAGNOSIS — N2581 Secondary hyperparathyroidism of renal origin: Secondary | ICD-10-CM | POA: Diagnosis not present

## 2019-01-16 DIAGNOSIS — Z992 Dependence on renal dialysis: Secondary | ICD-10-CM | POA: Diagnosis not present

## 2019-01-16 DIAGNOSIS — N186 End stage renal disease: Secondary | ICD-10-CM | POA: Diagnosis not present

## 2019-01-16 DIAGNOSIS — D631 Anemia in chronic kidney disease: Secondary | ICD-10-CM | POA: Diagnosis not present

## 2019-01-16 DIAGNOSIS — D509 Iron deficiency anemia, unspecified: Secondary | ICD-10-CM | POA: Diagnosis not present

## 2019-01-17 DIAGNOSIS — S52225A Nondisplaced transverse fracture of shaft of left ulna, initial encounter for closed fracture: Secondary | ICD-10-CM | POA: Diagnosis not present

## 2019-01-18 DIAGNOSIS — Z23 Encounter for immunization: Secondary | ICD-10-CM | POA: Diagnosis not present

## 2019-01-18 DIAGNOSIS — Z992 Dependence on renal dialysis: Secondary | ICD-10-CM | POA: Diagnosis not present

## 2019-01-18 DIAGNOSIS — D509 Iron deficiency anemia, unspecified: Secondary | ICD-10-CM | POA: Diagnosis not present

## 2019-01-18 DIAGNOSIS — N186 End stage renal disease: Secondary | ICD-10-CM | POA: Diagnosis not present

## 2019-01-18 DIAGNOSIS — D631 Anemia in chronic kidney disease: Secondary | ICD-10-CM | POA: Diagnosis not present

## 2019-01-18 DIAGNOSIS — N2581 Secondary hyperparathyroidism of renal origin: Secondary | ICD-10-CM | POA: Diagnosis not present

## 2019-01-20 DIAGNOSIS — N186 End stage renal disease: Secondary | ICD-10-CM | POA: Diagnosis not present

## 2019-01-20 DIAGNOSIS — D631 Anemia in chronic kidney disease: Secondary | ICD-10-CM | POA: Diagnosis not present

## 2019-01-20 DIAGNOSIS — Z992 Dependence on renal dialysis: Secondary | ICD-10-CM | POA: Diagnosis not present

## 2019-01-20 DIAGNOSIS — D509 Iron deficiency anemia, unspecified: Secondary | ICD-10-CM | POA: Diagnosis not present

## 2019-01-20 DIAGNOSIS — Z23 Encounter for immunization: Secondary | ICD-10-CM | POA: Diagnosis not present

## 2019-01-20 DIAGNOSIS — N2581 Secondary hyperparathyroidism of renal origin: Secondary | ICD-10-CM | POA: Diagnosis not present

## 2019-01-23 DIAGNOSIS — Z992 Dependence on renal dialysis: Secondary | ICD-10-CM | POA: Diagnosis not present

## 2019-01-23 DIAGNOSIS — D631 Anemia in chronic kidney disease: Secondary | ICD-10-CM | POA: Diagnosis not present

## 2019-01-23 DIAGNOSIS — N186 End stage renal disease: Secondary | ICD-10-CM | POA: Diagnosis not present

## 2019-01-23 DIAGNOSIS — Z23 Encounter for immunization: Secondary | ICD-10-CM | POA: Diagnosis not present

## 2019-01-23 DIAGNOSIS — N2581 Secondary hyperparathyroidism of renal origin: Secondary | ICD-10-CM | POA: Diagnosis not present

## 2019-01-23 DIAGNOSIS — D509 Iron deficiency anemia, unspecified: Secondary | ICD-10-CM | POA: Diagnosis not present

## 2019-01-25 DIAGNOSIS — Z23 Encounter for immunization: Secondary | ICD-10-CM | POA: Diagnosis not present

## 2019-01-25 DIAGNOSIS — D631 Anemia in chronic kidney disease: Secondary | ICD-10-CM | POA: Diagnosis not present

## 2019-01-25 DIAGNOSIS — N2581 Secondary hyperparathyroidism of renal origin: Secondary | ICD-10-CM | POA: Diagnosis not present

## 2019-01-25 DIAGNOSIS — Z992 Dependence on renal dialysis: Secondary | ICD-10-CM | POA: Diagnosis not present

## 2019-01-25 DIAGNOSIS — D509 Iron deficiency anemia, unspecified: Secondary | ICD-10-CM | POA: Diagnosis not present

## 2019-01-25 DIAGNOSIS — N186 End stage renal disease: Secondary | ICD-10-CM | POA: Diagnosis not present

## 2019-01-27 DIAGNOSIS — N186 End stage renal disease: Secondary | ICD-10-CM | POA: Diagnosis not present

## 2019-01-27 DIAGNOSIS — Z992 Dependence on renal dialysis: Secondary | ICD-10-CM | POA: Diagnosis not present

## 2019-01-27 DIAGNOSIS — D509 Iron deficiency anemia, unspecified: Secondary | ICD-10-CM | POA: Diagnosis not present

## 2019-01-27 DIAGNOSIS — N2581 Secondary hyperparathyroidism of renal origin: Secondary | ICD-10-CM | POA: Diagnosis not present

## 2019-01-27 DIAGNOSIS — Z23 Encounter for immunization: Secondary | ICD-10-CM | POA: Diagnosis not present

## 2019-01-27 DIAGNOSIS — D631 Anemia in chronic kidney disease: Secondary | ICD-10-CM | POA: Diagnosis not present

## 2019-01-30 DIAGNOSIS — Z992 Dependence on renal dialysis: Secondary | ICD-10-CM | POA: Diagnosis not present

## 2019-01-30 DIAGNOSIS — D509 Iron deficiency anemia, unspecified: Secondary | ICD-10-CM | POA: Diagnosis not present

## 2019-01-30 DIAGNOSIS — N186 End stage renal disease: Secondary | ICD-10-CM | POA: Diagnosis not present

## 2019-01-30 DIAGNOSIS — D631 Anemia in chronic kidney disease: Secondary | ICD-10-CM | POA: Diagnosis not present

## 2019-01-30 DIAGNOSIS — Z23 Encounter for immunization: Secondary | ICD-10-CM | POA: Diagnosis not present

## 2019-01-30 DIAGNOSIS — N2581 Secondary hyperparathyroidism of renal origin: Secondary | ICD-10-CM | POA: Diagnosis not present

## 2019-01-31 DIAGNOSIS — Z992 Dependence on renal dialysis: Secondary | ICD-10-CM | POA: Diagnosis not present

## 2019-01-31 DIAGNOSIS — N186 End stage renal disease: Secondary | ICD-10-CM | POA: Diagnosis not present

## 2019-02-01 DIAGNOSIS — N2581 Secondary hyperparathyroidism of renal origin: Secondary | ICD-10-CM | POA: Diagnosis not present

## 2019-02-01 DIAGNOSIS — Z23 Encounter for immunization: Secondary | ICD-10-CM | POA: Diagnosis not present

## 2019-02-01 DIAGNOSIS — D509 Iron deficiency anemia, unspecified: Secondary | ICD-10-CM | POA: Diagnosis not present

## 2019-02-01 DIAGNOSIS — D631 Anemia in chronic kidney disease: Secondary | ICD-10-CM | POA: Diagnosis not present

## 2019-02-01 DIAGNOSIS — Z992 Dependence on renal dialysis: Secondary | ICD-10-CM | POA: Diagnosis not present

## 2019-02-01 DIAGNOSIS — N186 End stage renal disease: Secondary | ICD-10-CM | POA: Diagnosis not present

## 2019-02-02 ENCOUNTER — Other Ambulatory Visit: Payer: Self-pay | Admitting: Family Medicine

## 2019-02-02 DIAGNOSIS — G4700 Insomnia, unspecified: Secondary | ICD-10-CM

## 2019-02-03 DIAGNOSIS — N2581 Secondary hyperparathyroidism of renal origin: Secondary | ICD-10-CM | POA: Diagnosis not present

## 2019-02-03 DIAGNOSIS — Z23 Encounter for immunization: Secondary | ICD-10-CM | POA: Diagnosis not present

## 2019-02-03 DIAGNOSIS — D509 Iron deficiency anemia, unspecified: Secondary | ICD-10-CM | POA: Diagnosis not present

## 2019-02-03 DIAGNOSIS — N186 End stage renal disease: Secondary | ICD-10-CM | POA: Diagnosis not present

## 2019-02-03 DIAGNOSIS — D631 Anemia in chronic kidney disease: Secondary | ICD-10-CM | POA: Diagnosis not present

## 2019-02-03 DIAGNOSIS — Z992 Dependence on renal dialysis: Secondary | ICD-10-CM | POA: Diagnosis not present

## 2019-02-05 ENCOUNTER — Other Ambulatory Visit: Payer: Self-pay | Admitting: Family Medicine

## 2019-02-05 DIAGNOSIS — R519 Headache, unspecified: Secondary | ICD-10-CM

## 2019-02-06 DIAGNOSIS — N186 End stage renal disease: Secondary | ICD-10-CM | POA: Diagnosis not present

## 2019-02-06 DIAGNOSIS — N2581 Secondary hyperparathyroidism of renal origin: Secondary | ICD-10-CM | POA: Diagnosis not present

## 2019-02-06 DIAGNOSIS — Z992 Dependence on renal dialysis: Secondary | ICD-10-CM | POA: Diagnosis not present

## 2019-02-06 DIAGNOSIS — D509 Iron deficiency anemia, unspecified: Secondary | ICD-10-CM | POA: Diagnosis not present

## 2019-02-06 DIAGNOSIS — Z23 Encounter for immunization: Secondary | ICD-10-CM | POA: Diagnosis not present

## 2019-02-06 DIAGNOSIS — D631 Anemia in chronic kidney disease: Secondary | ICD-10-CM | POA: Diagnosis not present

## 2019-02-08 DIAGNOSIS — D509 Iron deficiency anemia, unspecified: Secondary | ICD-10-CM | POA: Diagnosis not present

## 2019-02-08 DIAGNOSIS — N186 End stage renal disease: Secondary | ICD-10-CM | POA: Diagnosis not present

## 2019-02-08 DIAGNOSIS — D631 Anemia in chronic kidney disease: Secondary | ICD-10-CM | POA: Diagnosis not present

## 2019-02-08 DIAGNOSIS — Z992 Dependence on renal dialysis: Secondary | ICD-10-CM | POA: Diagnosis not present

## 2019-02-08 DIAGNOSIS — N2581 Secondary hyperparathyroidism of renal origin: Secondary | ICD-10-CM | POA: Diagnosis not present

## 2019-02-08 DIAGNOSIS — Z23 Encounter for immunization: Secondary | ICD-10-CM | POA: Diagnosis not present

## 2019-02-09 DIAGNOSIS — S52225A Nondisplaced transverse fracture of shaft of left ulna, initial encounter for closed fracture: Secondary | ICD-10-CM | POA: Diagnosis not present

## 2019-02-09 DIAGNOSIS — M179 Osteoarthritis of knee, unspecified: Secondary | ICD-10-CM | POA: Diagnosis not present

## 2019-02-09 DIAGNOSIS — M1711 Unilateral primary osteoarthritis, right knee: Secondary | ICD-10-CM | POA: Diagnosis not present

## 2019-02-10 DIAGNOSIS — D509 Iron deficiency anemia, unspecified: Secondary | ICD-10-CM | POA: Diagnosis not present

## 2019-02-10 DIAGNOSIS — D631 Anemia in chronic kidney disease: Secondary | ICD-10-CM | POA: Diagnosis not present

## 2019-02-10 DIAGNOSIS — Z23 Encounter for immunization: Secondary | ICD-10-CM | POA: Diagnosis not present

## 2019-02-10 DIAGNOSIS — Z992 Dependence on renal dialysis: Secondary | ICD-10-CM | POA: Diagnosis not present

## 2019-02-10 DIAGNOSIS — N186 End stage renal disease: Secondary | ICD-10-CM | POA: Diagnosis not present

## 2019-02-10 DIAGNOSIS — N2581 Secondary hyperparathyroidism of renal origin: Secondary | ICD-10-CM | POA: Diagnosis not present

## 2019-02-13 DIAGNOSIS — D631 Anemia in chronic kidney disease: Secondary | ICD-10-CM | POA: Diagnosis not present

## 2019-02-13 DIAGNOSIS — N2581 Secondary hyperparathyroidism of renal origin: Secondary | ICD-10-CM | POA: Diagnosis not present

## 2019-02-13 DIAGNOSIS — N186 End stage renal disease: Secondary | ICD-10-CM | POA: Diagnosis not present

## 2019-02-13 DIAGNOSIS — D509 Iron deficiency anemia, unspecified: Secondary | ICD-10-CM | POA: Diagnosis not present

## 2019-02-13 DIAGNOSIS — Z23 Encounter for immunization: Secondary | ICD-10-CM | POA: Diagnosis not present

## 2019-02-13 DIAGNOSIS — Z992 Dependence on renal dialysis: Secondary | ICD-10-CM | POA: Diagnosis not present

## 2019-02-15 DIAGNOSIS — Z992 Dependence on renal dialysis: Secondary | ICD-10-CM | POA: Diagnosis not present

## 2019-02-15 DIAGNOSIS — N186 End stage renal disease: Secondary | ICD-10-CM | POA: Diagnosis not present

## 2019-02-15 DIAGNOSIS — D631 Anemia in chronic kidney disease: Secondary | ICD-10-CM | POA: Diagnosis not present

## 2019-02-15 DIAGNOSIS — Z23 Encounter for immunization: Secondary | ICD-10-CM | POA: Diagnosis not present

## 2019-02-15 DIAGNOSIS — N2581 Secondary hyperparathyroidism of renal origin: Secondary | ICD-10-CM | POA: Diagnosis not present

## 2019-02-15 DIAGNOSIS — D509 Iron deficiency anemia, unspecified: Secondary | ICD-10-CM | POA: Diagnosis not present

## 2019-02-17 DIAGNOSIS — D509 Iron deficiency anemia, unspecified: Secondary | ICD-10-CM | POA: Diagnosis not present

## 2019-02-17 DIAGNOSIS — Z23 Encounter for immunization: Secondary | ICD-10-CM | POA: Diagnosis not present

## 2019-02-17 DIAGNOSIS — Z992 Dependence on renal dialysis: Secondary | ICD-10-CM | POA: Diagnosis not present

## 2019-02-17 DIAGNOSIS — D631 Anemia in chronic kidney disease: Secondary | ICD-10-CM | POA: Diagnosis not present

## 2019-02-17 DIAGNOSIS — N2581 Secondary hyperparathyroidism of renal origin: Secondary | ICD-10-CM | POA: Diagnosis not present

## 2019-02-17 DIAGNOSIS — N186 End stage renal disease: Secondary | ICD-10-CM | POA: Diagnosis not present

## 2019-02-20 DIAGNOSIS — N2581 Secondary hyperparathyroidism of renal origin: Secondary | ICD-10-CM | POA: Diagnosis not present

## 2019-02-20 DIAGNOSIS — D631 Anemia in chronic kidney disease: Secondary | ICD-10-CM | POA: Diagnosis not present

## 2019-02-20 DIAGNOSIS — Z23 Encounter for immunization: Secondary | ICD-10-CM | POA: Diagnosis not present

## 2019-02-20 DIAGNOSIS — D509 Iron deficiency anemia, unspecified: Secondary | ICD-10-CM | POA: Diagnosis not present

## 2019-02-20 DIAGNOSIS — N186 End stage renal disease: Secondary | ICD-10-CM | POA: Diagnosis not present

## 2019-02-20 DIAGNOSIS — Z992 Dependence on renal dialysis: Secondary | ICD-10-CM | POA: Diagnosis not present

## 2019-02-21 ENCOUNTER — Telehealth: Payer: Self-pay | Admitting: Family Medicine

## 2019-02-21 NOTE — Chronic Care Management (AMB) (Signed)
Chronic Care Management   Note  02/21/2019 Name: Amy [name] MRN: [id number] DOB: 11-05-1950  Amy [name] is a 68 y.o. year old female who is a primary care patient of Caryn Section, Kirstie Peri, MD. I reached out to [name] by phone today in response to a referral sent by [name]'s health plan.     [name] was given information about Chronic Care Management services today including:  1. CCM service includes personalized support from designated clinical staff supervised by her physician, including individualized plan of care and coordination with other care providers 2. 24/7 contact phone numbers for assistance for urgent and routine care needs. 3. Service will only be billed when office clinical staff spend 20 minutes or more in a month to coordinate care. 4. Only one practitioner may furnish and bill the service in a calendar month. 5. The patient may stop CCM services at any time (effective at the end of the month) by phone call to the office staff. 6. The patient will be responsible for cost sharing (co-pay) of up to 20% of the service fee (after annual deductible is met).  Patient did not agree to enrollment in care management services and does not wish to consider at this time.  Follow up plan: The patient has been provided with contact information for the chronic care management team and has been advised to call with any health related questions or concerns.   Dietrich  [email or web address]   ??[phone]

## 2019-02-24 DIAGNOSIS — N2581 Secondary hyperparathyroidism of renal origin: Secondary | ICD-10-CM | POA: Diagnosis not present

## 2019-02-24 DIAGNOSIS — D631 Anemia in chronic kidney disease: Secondary | ICD-10-CM | POA: Diagnosis not present

## 2019-02-24 DIAGNOSIS — N186 End stage renal disease: Secondary | ICD-10-CM | POA: Diagnosis not present

## 2019-02-24 DIAGNOSIS — D509 Iron deficiency anemia, unspecified: Secondary | ICD-10-CM | POA: Diagnosis not present

## 2019-02-24 DIAGNOSIS — Z992 Dependence on renal dialysis: Secondary | ICD-10-CM | POA: Diagnosis not present

## 2019-02-24 DIAGNOSIS — Z23 Encounter for immunization: Secondary | ICD-10-CM | POA: Diagnosis not present

## 2019-02-27 DIAGNOSIS — Z992 Dependence on renal dialysis: Secondary | ICD-10-CM | POA: Diagnosis not present

## 2019-02-27 DIAGNOSIS — N186 End stage renal disease: Secondary | ICD-10-CM | POA: Diagnosis not present

## 2019-02-27 DIAGNOSIS — Z23 Encounter for immunization: Secondary | ICD-10-CM | POA: Diagnosis not present

## 2019-02-27 DIAGNOSIS — N2581 Secondary hyperparathyroidism of renal origin: Secondary | ICD-10-CM | POA: Diagnosis not present

## 2019-02-27 DIAGNOSIS — D631 Anemia in chronic kidney disease: Secondary | ICD-10-CM | POA: Diagnosis not present

## 2019-02-27 DIAGNOSIS — D509 Iron deficiency anemia, unspecified: Secondary | ICD-10-CM | POA: Diagnosis not present

## 2019-03-01 DIAGNOSIS — N2581 Secondary hyperparathyroidism of renal origin: Secondary | ICD-10-CM | POA: Diagnosis not present

## 2019-03-01 DIAGNOSIS — Z992 Dependence on renal dialysis: Secondary | ICD-10-CM | POA: Diagnosis not present

## 2019-03-01 DIAGNOSIS — N186 End stage renal disease: Secondary | ICD-10-CM | POA: Diagnosis not present

## 2019-03-01 DIAGNOSIS — D509 Iron deficiency anemia, unspecified: Secondary | ICD-10-CM | POA: Diagnosis not present

## 2019-03-01 DIAGNOSIS — Z23 Encounter for immunization: Secondary | ICD-10-CM | POA: Diagnosis not present

## 2019-03-01 DIAGNOSIS — D631 Anemia in chronic kidney disease: Secondary | ICD-10-CM | POA: Diagnosis not present

## 2019-03-03 DIAGNOSIS — N186 End stage renal disease: Secondary | ICD-10-CM | POA: Diagnosis not present

## 2019-03-03 DIAGNOSIS — N2581 Secondary hyperparathyroidism of renal origin: Secondary | ICD-10-CM | POA: Diagnosis not present

## 2019-03-03 DIAGNOSIS — D631 Anemia in chronic kidney disease: Secondary | ICD-10-CM | POA: Diagnosis not present

## 2019-03-03 DIAGNOSIS — Z992 Dependence on renal dialysis: Secondary | ICD-10-CM | POA: Diagnosis not present

## 2019-03-03 DIAGNOSIS — D509 Iron deficiency anemia, unspecified: Secondary | ICD-10-CM | POA: Diagnosis not present

## 2019-03-03 DIAGNOSIS — Z23 Encounter for immunization: Secondary | ICD-10-CM | POA: Diagnosis not present

## 2019-03-05 ENCOUNTER — Other Ambulatory Visit: Payer: Self-pay | Admitting: Family Medicine

## 2019-03-06 DIAGNOSIS — N186 End stage renal disease: Secondary | ICD-10-CM | POA: Diagnosis not present

## 2019-03-06 DIAGNOSIS — Z992 Dependence on renal dialysis: Secondary | ICD-10-CM | POA: Diagnosis not present

## 2019-03-06 DIAGNOSIS — D631 Anemia in chronic kidney disease: Secondary | ICD-10-CM | POA: Diagnosis not present

## 2019-03-06 DIAGNOSIS — D509 Iron deficiency anemia, unspecified: Secondary | ICD-10-CM | POA: Diagnosis not present

## 2019-03-06 DIAGNOSIS — N2581 Secondary hyperparathyroidism of renal origin: Secondary | ICD-10-CM | POA: Diagnosis not present

## 2019-03-08 DIAGNOSIS — N2581 Secondary hyperparathyroidism of renal origin: Secondary | ICD-10-CM | POA: Diagnosis not present

## 2019-03-08 DIAGNOSIS — D509 Iron deficiency anemia, unspecified: Secondary | ICD-10-CM | POA: Diagnosis not present

## 2019-03-08 DIAGNOSIS — D631 Anemia in chronic kidney disease: Secondary | ICD-10-CM | POA: Diagnosis not present

## 2019-03-08 DIAGNOSIS — Z992 Dependence on renal dialysis: Secondary | ICD-10-CM | POA: Diagnosis not present

## 2019-03-08 DIAGNOSIS — N186 End stage renal disease: Secondary | ICD-10-CM | POA: Diagnosis not present

## 2019-03-10 DIAGNOSIS — Z992 Dependence on renal dialysis: Secondary | ICD-10-CM | POA: Diagnosis not present

## 2019-03-10 DIAGNOSIS — D509 Iron deficiency anemia, unspecified: Secondary | ICD-10-CM | POA: Diagnosis not present

## 2019-03-10 DIAGNOSIS — D631 Anemia in chronic kidney disease: Secondary | ICD-10-CM | POA: Diagnosis not present

## 2019-03-10 DIAGNOSIS — N2581 Secondary hyperparathyroidism of renal origin: Secondary | ICD-10-CM | POA: Diagnosis not present

## 2019-03-10 DIAGNOSIS — N186 End stage renal disease: Secondary | ICD-10-CM | POA: Diagnosis not present

## 2019-03-13 DIAGNOSIS — D509 Iron deficiency anemia, unspecified: Secondary | ICD-10-CM | POA: Diagnosis not present

## 2019-03-13 DIAGNOSIS — N186 End stage renal disease: Secondary | ICD-10-CM | POA: Diagnosis not present

## 2019-03-13 DIAGNOSIS — Z992 Dependence on renal dialysis: Secondary | ICD-10-CM | POA: Diagnosis not present

## 2019-03-13 DIAGNOSIS — D631 Anemia in chronic kidney disease: Secondary | ICD-10-CM | POA: Diagnosis not present

## 2019-03-13 DIAGNOSIS — N2581 Secondary hyperparathyroidism of renal origin: Secondary | ICD-10-CM | POA: Diagnosis not present

## 2019-03-15 DIAGNOSIS — D509 Iron deficiency anemia, unspecified: Secondary | ICD-10-CM | POA: Diagnosis not present

## 2019-03-15 DIAGNOSIS — D631 Anemia in chronic kidney disease: Secondary | ICD-10-CM | POA: Diagnosis not present

## 2019-03-15 DIAGNOSIS — N186 End stage renal disease: Secondary | ICD-10-CM | POA: Diagnosis not present

## 2019-03-15 DIAGNOSIS — N2581 Secondary hyperparathyroidism of renal origin: Secondary | ICD-10-CM | POA: Diagnosis not present

## 2019-03-15 DIAGNOSIS — Z992 Dependence on renal dialysis: Secondary | ICD-10-CM | POA: Diagnosis not present

## 2019-03-17 DIAGNOSIS — N186 End stage renal disease: Secondary | ICD-10-CM | POA: Diagnosis not present

## 2019-03-17 DIAGNOSIS — N2581 Secondary hyperparathyroidism of renal origin: Secondary | ICD-10-CM | POA: Diagnosis not present

## 2019-03-17 DIAGNOSIS — Z992 Dependence on renal dialysis: Secondary | ICD-10-CM | POA: Diagnosis not present

## 2019-03-17 DIAGNOSIS — D509 Iron deficiency anemia, unspecified: Secondary | ICD-10-CM | POA: Diagnosis not present

## 2019-03-17 DIAGNOSIS — D631 Anemia in chronic kidney disease: Secondary | ICD-10-CM | POA: Diagnosis not present

## 2019-03-19 DIAGNOSIS — G5603 Carpal tunnel syndrome, bilateral upper limbs: Secondary | ICD-10-CM | POA: Diagnosis not present

## 2019-03-20 DIAGNOSIS — N2581 Secondary hyperparathyroidism of renal origin: Secondary | ICD-10-CM | POA: Diagnosis not present

## 2019-03-20 DIAGNOSIS — N186 End stage renal disease: Secondary | ICD-10-CM | POA: Diagnosis not present

## 2019-03-20 DIAGNOSIS — Z992 Dependence on renal dialysis: Secondary | ICD-10-CM | POA: Diagnosis not present

## 2019-03-20 DIAGNOSIS — D631 Anemia in chronic kidney disease: Secondary | ICD-10-CM | POA: Diagnosis not present

## 2019-03-20 DIAGNOSIS — D509 Iron deficiency anemia, unspecified: Secondary | ICD-10-CM | POA: Diagnosis not present

## 2019-03-22 DIAGNOSIS — N186 End stage renal disease: Secondary | ICD-10-CM | POA: Diagnosis not present

## 2019-03-22 DIAGNOSIS — Z992 Dependence on renal dialysis: Secondary | ICD-10-CM | POA: Diagnosis not present

## 2019-03-22 DIAGNOSIS — N2581 Secondary hyperparathyroidism of renal origin: Secondary | ICD-10-CM | POA: Diagnosis not present

## 2019-03-22 DIAGNOSIS — D631 Anemia in chronic kidney disease: Secondary | ICD-10-CM | POA: Diagnosis not present

## 2019-03-22 DIAGNOSIS — D509 Iron deficiency anemia, unspecified: Secondary | ICD-10-CM | POA: Diagnosis not present

## 2019-03-24 DIAGNOSIS — N2581 Secondary hyperparathyroidism of renal origin: Secondary | ICD-10-CM | POA: Diagnosis not present

## 2019-03-24 DIAGNOSIS — N186 End stage renal disease: Secondary | ICD-10-CM | POA: Diagnosis not present

## 2019-03-24 DIAGNOSIS — D509 Iron deficiency anemia, unspecified: Secondary | ICD-10-CM | POA: Diagnosis not present

## 2019-03-24 DIAGNOSIS — Z992 Dependence on renal dialysis: Secondary | ICD-10-CM | POA: Diagnosis not present

## 2019-03-24 DIAGNOSIS — D631 Anemia in chronic kidney disease: Secondary | ICD-10-CM | POA: Diagnosis not present

## 2019-03-27 DIAGNOSIS — Z992 Dependence on renal dialysis: Secondary | ICD-10-CM | POA: Diagnosis not present

## 2019-03-27 DIAGNOSIS — N186 End stage renal disease: Secondary | ICD-10-CM | POA: Diagnosis not present

## 2019-03-27 DIAGNOSIS — D509 Iron deficiency anemia, unspecified: Secondary | ICD-10-CM | POA: Diagnosis not present

## 2019-03-27 DIAGNOSIS — N2581 Secondary hyperparathyroidism of renal origin: Secondary | ICD-10-CM | POA: Diagnosis not present

## 2019-03-27 DIAGNOSIS — D631 Anemia in chronic kidney disease: Secondary | ICD-10-CM | POA: Diagnosis not present

## 2019-03-30 ENCOUNTER — Other Ambulatory Visit
Admission: RE | Admit: 2019-03-30 | Discharge: 2019-03-30 | Disposition: A | Discharge status: Home / Self Care | Payer: Medicare Other | Source: Ambulatory Visit | Attending: Nephrology | Admitting: Nephrology

## 2019-03-30 DIAGNOSIS — Z01812 Encounter for preprocedural laboratory examination: Secondary | ICD-10-CM | POA: Insufficient documentation

## 2019-03-30 DIAGNOSIS — N2581 Secondary hyperparathyroidism of renal origin: Secondary | ICD-10-CM | POA: Diagnosis not present

## 2019-03-30 DIAGNOSIS — D509 Iron deficiency anemia, unspecified: Secondary | ICD-10-CM | POA: Diagnosis not present

## 2019-03-30 DIAGNOSIS — Z20828 Contact with and (suspected) exposure to other viral communicable diseases: Secondary | ICD-10-CM | POA: Insufficient documentation

## 2019-03-30 DIAGNOSIS — N186 End stage renal disease: Secondary | ICD-10-CM | POA: Diagnosis not present

## 2019-03-30 DIAGNOSIS — Z992 Dependence on renal dialysis: Secondary | ICD-10-CM | POA: Diagnosis not present

## 2019-03-30 DIAGNOSIS — D631 Anemia in chronic kidney disease: Secondary | ICD-10-CM | POA: Diagnosis not present

## 2019-03-31 DIAGNOSIS — N186 End stage renal disease: Secondary | ICD-10-CM | POA: Diagnosis not present

## 2019-03-31 DIAGNOSIS — Z992 Dependence on renal dialysis: Secondary | ICD-10-CM | POA: Diagnosis not present

## 2019-03-31 DIAGNOSIS — D509 Iron deficiency anemia, unspecified: Secondary | ICD-10-CM | POA: Diagnosis not present

## 2019-03-31 DIAGNOSIS — N2581 Secondary hyperparathyroidism of renal origin: Secondary | ICD-10-CM | POA: Diagnosis not present

## 2019-03-31 DIAGNOSIS — D631 Anemia in chronic kidney disease: Secondary | ICD-10-CM | POA: Diagnosis not present

## 2019-04-02 DIAGNOSIS — N186 End stage renal disease: Secondary | ICD-10-CM | POA: Diagnosis not present

## 2019-04-02 DIAGNOSIS — Z992 Dependence on renal dialysis: Secondary | ICD-10-CM | POA: Diagnosis not present

## 2019-04-02 LAB — NOVEL CORONAVIRUS, NAA (HOSP ORDER, SEND-OUT TO REF LAB; TAT 18-24 HRS): SARS-CoV-2, NAA: NOT DETECTED

## 2019-04-03 ENCOUNTER — Emergency Department: Payer: Medicare Other

## 2019-04-03 ENCOUNTER — Other Ambulatory Visit: Payer: Self-pay

## 2019-04-03 ENCOUNTER — Encounter: Payer: Self-pay | Admitting: Emergency Medicine

## 2019-04-03 ENCOUNTER — Inpatient Hospital Stay
Admission: EM | Admit: 2019-04-03 | Discharge: 2019-04-06 | DRG: 152 | Disposition: A | Discharge status: Home / Self Care | Payer: Medicare Other | Attending: Family Medicine | Admitting: Family Medicine

## 2019-04-03 DIAGNOSIS — E876 Hypokalemia: Secondary | ICD-10-CM | POA: Diagnosis not present

## 2019-04-03 DIAGNOSIS — J811 Chronic pulmonary edema: Secondary | ICD-10-CM | POA: Diagnosis present

## 2019-04-03 DIAGNOSIS — R05 Cough: Secondary | ICD-10-CM | POA: Diagnosis not present

## 2019-04-03 DIAGNOSIS — Z8719 Personal history of other diseases of the digestive system: Secondary | ICD-10-CM | POA: Diagnosis not present

## 2019-04-03 DIAGNOSIS — Z881 Allergy status to other antibiotic agents status: Secondary | ICD-10-CM

## 2019-04-03 DIAGNOSIS — N186 End stage renal disease: Secondary | ICD-10-CM | POA: Diagnosis present

## 2019-04-03 DIAGNOSIS — I509 Heart failure, unspecified: Secondary | ICD-10-CM | POA: Diagnosis present

## 2019-04-03 DIAGNOSIS — J069 Acute upper respiratory infection, unspecified: Principal | ICD-10-CM | POA: Diagnosis present

## 2019-04-03 DIAGNOSIS — I4439 Other atrioventricular block: Secondary | ICD-10-CM | POA: Diagnosis present

## 2019-04-03 DIAGNOSIS — Z8249 Family history of ischemic heart disease and other diseases of the circulatory system: Secondary | ICD-10-CM

## 2019-04-03 DIAGNOSIS — F419 Anxiety disorder, unspecified: Secondary | ICD-10-CM | POA: Diagnosis present

## 2019-04-03 DIAGNOSIS — I483 Typical atrial flutter: Secondary | ICD-10-CM | POA: Diagnosis present

## 2019-04-03 DIAGNOSIS — Z90710 Acquired absence of both cervix and uterus: Secondary | ICD-10-CM

## 2019-04-03 DIAGNOSIS — I4891 Unspecified atrial fibrillation: Secondary | ICD-10-CM | POA: Diagnosis not present

## 2019-04-03 DIAGNOSIS — E785 Hyperlipidemia, unspecified: Secondary | ICD-10-CM | POA: Diagnosis present

## 2019-04-03 DIAGNOSIS — G4700 Insomnia, unspecified: Secondary | ICD-10-CM | POA: Diagnosis present

## 2019-04-03 DIAGNOSIS — Z8619 Personal history of other infectious and parasitic diseases: Secondary | ICD-10-CM | POA: Diagnosis not present

## 2019-04-03 DIAGNOSIS — R509 Fever, unspecified: Secondary | ICD-10-CM | POA: Diagnosis present

## 2019-04-03 DIAGNOSIS — E1165 Type 2 diabetes mellitus with hyperglycemia: Secondary | ICD-10-CM | POA: Diagnosis not present

## 2019-04-03 DIAGNOSIS — Z9049 Acquired absence of other specified parts of digestive tract: Secondary | ICD-10-CM

## 2019-04-03 DIAGNOSIS — N2581 Secondary hyperparathyroidism of renal origin: Secondary | ICD-10-CM | POA: Diagnosis present

## 2019-04-03 DIAGNOSIS — I4892 Unspecified atrial flutter: Secondary | ICD-10-CM

## 2019-04-03 DIAGNOSIS — Z888 Allergy status to other drugs, medicaments and biological substances status: Secondary | ICD-10-CM

## 2019-04-03 DIAGNOSIS — E042 Nontoxic multinodular goiter: Secondary | ICD-10-CM | POA: Diagnosis present

## 2019-04-03 DIAGNOSIS — Z992 Dependence on renal dialysis: Secondary | ICD-10-CM | POA: Diagnosis not present

## 2019-04-03 DIAGNOSIS — I132 Hypertensive heart and chronic kidney disease with heart failure and with stage 5 chronic kidney disease, or end stage renal disease: Secondary | ICD-10-CM | POA: Diagnosis present

## 2019-04-03 DIAGNOSIS — R0789 Other chest pain: Secondary | ICD-10-CM | POA: Diagnosis not present

## 2019-04-03 DIAGNOSIS — Z20828 Contact with and (suspected) exposure to other viral communicable diseases: Secondary | ICD-10-CM | POA: Diagnosis present

## 2019-04-03 DIAGNOSIS — E871 Hypo-osmolality and hyponatremia: Secondary | ICD-10-CM | POA: Diagnosis present

## 2019-04-03 DIAGNOSIS — I120 Hypertensive chronic kidney disease with stage 5 chronic kidney disease or end stage renal disease: Secondary | ICD-10-CM | POA: Diagnosis not present

## 2019-04-03 DIAGNOSIS — I10 Essential (primary) hypertension: Secondary | ICD-10-CM

## 2019-04-03 DIAGNOSIS — K219 Gastro-esophageal reflux disease without esophagitis: Secondary | ICD-10-CM | POA: Diagnosis present

## 2019-04-03 DIAGNOSIS — D631 Anemia in chronic kidney disease: Secondary | ICD-10-CM | POA: Diagnosis present

## 2019-04-03 DIAGNOSIS — J302 Other seasonal allergic rhinitis: Secondary | ICD-10-CM | POA: Diagnosis present

## 2019-04-03 DIAGNOSIS — Z880 Allergy status to penicillin: Secondary | ICD-10-CM

## 2019-04-03 DIAGNOSIS — G609 Hereditary and idiopathic neuropathy, unspecified: Secondary | ICD-10-CM | POA: Diagnosis present

## 2019-04-03 DIAGNOSIS — R079 Chest pain, unspecified: Secondary | ICD-10-CM | POA: Diagnosis not present

## 2019-04-03 DIAGNOSIS — Z79899 Other long term (current) drug therapy: Secondary | ICD-10-CM

## 2019-04-03 DIAGNOSIS — Z882 Allergy status to sulfonamides status: Secondary | ICD-10-CM

## 2019-04-03 LAB — CBC
HCT: 25 % — ABNORMAL LOW (ref 36.0–46.0)
HCT: 20.9 % — ABNORMAL LOW (ref 36.0–46.0)
Hemoglobin: 8.1 g/dL — ABNORMAL LOW (ref 12.0–15.0)
Hemoglobin: 7.1 g/dL — ABNORMAL LOW (ref 12.0–15.0)
MCH: 29 pg (ref 26.0–34.0)
MCH: 29.3 pg (ref 26.0–34.0)
MCHC: 32.4 g/dL (ref 30.0–36.0)
MCHC: 34 g/dL (ref 30.0–36.0)
MCV: 89.6 fL (ref 80.0–100.0)
MCV: 86.4 fL (ref 80.0–100.0)
Platelets: 321 10*3/uL (ref 150–400)
Platelets: 271 10*3/uL (ref 150–400)
RBC: 2.79 MIL/uL — ABNORMAL LOW (ref 3.87–5.11)
RBC: 2.42 MIL/uL — ABNORMAL LOW (ref 3.87–5.11)
RDW: 16.4 % — ABNORMAL HIGH (ref 11.5–15.5)
RDW: 16.3 % — ABNORMAL HIGH (ref 11.5–15.5)
WBC: 17.9 10*3/uL — ABNORMAL HIGH (ref 4.0–10.5)
WBC: 16 10*3/uL — ABNORMAL HIGH (ref 4.0–10.5)
nRBC: 0 % (ref 0.0–0.2)
nRBC: 0 % (ref 0.0–0.2)

## 2019-04-03 LAB — BASIC METABOLIC PANEL
Anion gap: 17 — ABNORMAL HIGH (ref 5–15)
BUN: 67 mg/dL — ABNORMAL HIGH (ref 8–23)
CO2: 20 mmol/L — ABNORMAL LOW (ref 22–32)
Calcium: 9 mg/dL (ref 8.9–10.3)
Chloride: 95 mmol/L — ABNORMAL LOW (ref 98–111)
Creatinine, Ser: 5.87 mg/dL — ABNORMAL HIGH (ref 0.44–1.00)
GFR calc Af Amer: 8 mL/min — ABNORMAL LOW (ref >60)
GFR calc non Af Amer: 7 mL/min — ABNORMAL LOW (ref >60)
Glucose, Bld: 124 mg/dL — ABNORMAL HIGH (ref 70–99)
Potassium: 3.8 mmol/L (ref 3.5–5.1)
Sodium: 132 mmol/L — ABNORMAL LOW (ref 135–145)

## 2019-04-03 LAB — TSH: TSH: 1.277 u[IU]/mL (ref 0.350–4.500)

## 2019-04-03 LAB — TROPONIN I (HIGH SENSITIVITY)
Troponin I (High Sensitivity): 13 ng/L (ref <18)
Troponin I (High Sensitivity): 12 ng/L (ref <18)

## 2019-04-03 LAB — URINALYSIS, COMPLETE (UACMP) WITH MICROSCOPIC
Bacteria, UA: NONE SEEN
Bilirubin Urine: NEGATIVE
Glucose, UA: NEGATIVE mg/dL
Hgb urine dipstick: NEGATIVE
Ketones, ur: NEGATIVE mg/dL
Leukocytes,Ua: NEGATIVE
Nitrite: NEGATIVE
Protein, ur: 100 mg/dL — AB
Specific Gravity, Urine: 1.017 (ref 1.005–1.030)
pH: 5 (ref 5.0–8.0)

## 2019-04-03 LAB — APTT: aPTT: 32 seconds (ref 24–36)

## 2019-04-03 LAB — CREATININE, SERUM
Creatinine, Ser: 5.81 mg/dL — ABNORMAL HIGH (ref 0.44–1.00)
GFR calc Af Amer: 8 mL/min — ABNORMAL LOW (ref >60)
GFR calc non Af Amer: 7 mL/min — ABNORMAL LOW (ref >60)

## 2019-04-03 LAB — PROTIME-INR
INR: 1 (ref 0.8–1.2)
Prothrombin Time: 13 seconds (ref 11.4–15.2)

## 2019-04-03 LAB — PROCALCITONIN: Procalcitonin: 0.33 ng/mL

## 2019-04-03 LAB — LACTIC ACID, PLASMA: Lactic Acid, Venous: 0.9 mmol/L (ref 0.5–1.9)

## 2019-04-03 LAB — POC SARS CORONAVIRUS 2 AG: SARS Coronavirus 2 Ag: NEGATIVE

## 2019-04-03 LAB — BRAIN NATRIURETIC PEPTIDE: B Natriuretic Peptide: 536 pg/mL — ABNORMAL HIGH (ref 0.0–100.0)

## 2019-04-03 MED ORDER — LORAZEPAM 1 MG PO TABS
1.0000 mg | ORAL_TABLET | Freq: Two times a day (BID) | ORAL | Status: DC
  Administered 2019-04-03 – 2019-04-06 (×6): 1 mg via ORAL
  Filled 2019-04-03 (×6): qty 1

## 2019-04-03 MED ORDER — CARVEDILOL 12.5 MG PO TABS
25.0000 mg | ORAL_TABLET | Freq: Two times a day (BID) | ORAL | Status: DC
  Administered 2019-04-04: 25 mg via ORAL
  Filled 2019-04-03: qty 1
  Filled 2019-04-03: qty 2

## 2019-04-03 MED ORDER — PANTOPRAZOLE SODIUM 40 MG PO TBEC
40.0000 mg | DELAYED_RELEASE_TABLET | Freq: Every day | ORAL | Status: DC
  Administered 2019-04-04 – 2019-04-06 (×3): 40 mg via ORAL
  Filled 2019-04-03 (×3): qty 1

## 2019-04-03 MED ORDER — EPOETIN ALFA 10000 UNIT/ML IJ SOLN
10000.0000 [IU] | INTRAMUSCULAR | Status: DC
  Administered 2019-04-03 – 2019-04-05 (×2): 10000 [IU] via INTRAVENOUS

## 2019-04-03 MED ORDER — CLONIDINE HCL 0.1 MG PO TABS
0.3000 mg | ORAL_TABLET | Freq: Three times a day (TID) | ORAL | Status: DC
  Administered 2019-04-03: 0.3 mg via ORAL
  Filled 2019-04-03: qty 3

## 2019-04-03 MED ORDER — METOPROLOL TARTRATE 25 MG PO TABS
12.5000 mg | ORAL_TABLET | Freq: Two times a day (BID) | ORAL | Status: DC
  Administered 2019-04-03 (×2): 12.5 mg via ORAL
  Filled 2019-04-03 (×3): qty 1

## 2019-04-03 MED ORDER — ACETAMINOPHEN 500 MG PO TABS
1000.0000 mg | ORAL_TABLET | Freq: Once | ORAL | Status: AC
  Administered 2019-04-03: 1000 mg via ORAL

## 2019-04-03 MED ORDER — LEVOFLOXACIN 750 MG PO TABS: 750.0000 mg | ORAL_TABLET | Freq: Once | ORAL | Status: DC

## 2019-04-03 MED ORDER — CHLORHEXIDINE GLUCONATE CLOTH 2 % EX PADS
6.0000 | MEDICATED_PAD | Freq: Every day | CUTANEOUS | Status: DC
  Administered 2019-04-04 – 2019-04-06 (×3): 6 via TOPICAL
  Filled 2019-04-03: qty 6

## 2019-04-03 MED ORDER — HEPARIN SODIUM (PORCINE) 5000 UNIT/ML IJ SOLN
5000.0000 [IU] | Freq: Three times a day (TID) | INTRAMUSCULAR | Status: DC
  Administered 2019-04-04 – 2019-04-05 (×5): 5000 [IU] via SUBCUTANEOUS
  Filled 2019-04-03 (×6): qty 1

## 2019-04-03 MED ORDER — ONDANSETRON HCL 4 MG PO TABS
4.0000 mg | ORAL_TABLET | Freq: Four times a day (QID) | ORAL | Status: DC | PRN
  Filled 2019-04-03: qty 1

## 2019-04-03 MED ORDER — DILTIAZEM HCL 100 MG IV SOLR
5.0000 mg/h | INTRAVENOUS | Status: DC
  Administered 2019-04-03: 5 mg/h via INTRAVENOUS
  Filled 2019-04-03: qty 100

## 2019-04-03 MED ORDER — DILTIAZEM HCL 100 MG IV SOLR: 5.0000 mg/h | INTRAVENOUS | Status: DC

## 2019-04-03 MED ORDER — AMLODIPINE BESYLATE 5 MG PO TABS
10.0000 mg | ORAL_TABLET | Freq: Every day | ORAL | Status: DC
  Filled 2019-04-03: qty 2

## 2019-04-03 MED ORDER — ACETAMINOPHEN 650 MG RE SUPP: 650.0000 mg | Freq: Four times a day (QID) | RECTAL | Status: DC | PRN

## 2019-04-03 MED ORDER — BUPROPION HCL 75 MG PO TABS
75.0000 mg | ORAL_TABLET | Freq: Two times a day (BID) | ORAL | Status: DC
  Administered 2019-04-04 – 2019-04-06 (×6): 75 mg via ORAL
  Filled 2019-04-03 (×9): qty 1

## 2019-04-03 MED ORDER — HYDROCODONE-ACETAMINOPHEN 5-325 MG PO TABS
1.0000 | ORAL_TABLET | ORAL | Status: DC | PRN
  Administered 2019-04-03 – 2019-04-05 (×2): 1 via ORAL
  Filled 2019-04-03 (×2): qty 1

## 2019-04-03 MED ORDER — LEVOFLOXACIN IN D5W 750 MG/150ML IV SOLN
750.0000 mg | Freq: Once | INTRAVENOUS | Status: AC
  Administered 2019-04-03: 15:00:00 750 mg via INTRAVENOUS
  Filled 2019-04-03: qty 150

## 2019-04-03 MED ORDER — TORSEMIDE 20 MG PO TABS
100.0000 mg | ORAL_TABLET | Freq: Two times a day (BID) | ORAL | Status: DC
  Administered 2019-04-03 – 2019-04-06 (×6): 100 mg via ORAL
  Filled 2019-04-03: qty 1
  Filled 2019-04-03: qty 5
  Filled 2019-04-03: qty 1
  Filled 2019-04-03 (×4): qty 5

## 2019-04-03 MED ORDER — SODIUM CHLORIDE 0.9 % IV BOLUS
250.0000 mL | Freq: Once | INTRAVENOUS | Status: AC
  Administered 2019-04-03: 250 mL via INTRAVENOUS

## 2019-04-03 MED ORDER — ACETAMINOPHEN 325 MG PO TABS
650.0000 mg | ORAL_TABLET | Freq: Four times a day (QID) | ORAL | Status: DC | PRN
  Administered 2019-04-04: 650 mg via ORAL
  Filled 2019-04-03: qty 2

## 2019-04-03 MED ORDER — DILTIAZEM HCL 25 MG/5ML IV SOLN
10.0000 mg | Freq: Once | INTRAVENOUS | Status: AC
  Administered 2019-04-03: 10 mg via INTRAVENOUS
  Filled 2019-04-03: qty 5

## 2019-04-03 MED ORDER — ONDANSETRON HCL 4 MG/2ML IJ SOLN: 4.0000 mg | Freq: Four times a day (QID) | INTRAMUSCULAR | Status: DC | PRN

## 2019-04-03 MED ORDER — SENNOSIDES-DOCUSATE SODIUM 8.6-50 MG PO TABS
1.0000 | ORAL_TABLET | Freq: Every evening | ORAL | Status: DC | PRN
  Filled 2019-04-03: qty 1

## 2019-04-03 MED ORDER — LEVOFLOXACIN 500 MG PO TABS
500.0000 mg | ORAL_TABLET | ORAL | Status: DC
  Administered 2019-04-05: 14:00:00 500 mg via ORAL
  Filled 2019-04-03: qty 1

## 2019-04-03 MED ORDER — VANCOMYCIN HCL IN DEXTROSE 1-5 GM/200ML-% IV SOLN
1000.0000 mg | Freq: Once | INTRAVENOUS | Status: AC
  Administered 2019-04-03: 1000 mg via INTRAVENOUS
  Filled 2019-04-03: qty 200

## 2019-04-03 MED ORDER — BISACODYL 5 MG PO TBEC
5.0000 mg | DELAYED_RELEASE_TABLET | Freq: Every day | ORAL | Status: DC | PRN
  Filled 2019-04-03: qty 1

## 2019-04-03 NOTE — Progress Notes (Signed)
   04/03/19 2145  Vital Signs  Pulse Rate 98  Pulse Rate Source Monitor  Resp 19  BP 118/73  Oxygen Therapy  SpO2 98 %  During Hemodialysis Assessment  Blood Flow Rate (mL/min) 400 mL/min  Arterial Pressure (mmHg) -130 mmHg  Venous Pressure (mmHg) 120 mmHg  Transmembrane Pressure (mmHg) 50 mmHg  Ultrafiltration Rate (mL/min) 330 mL/min  Dialysate Flow Rate (mL/min) 600 ml/min  Conductivity: Machine  13.8  HD Safety Checks Performed Yes  Intra-Hemodialysis Comments Progressing as prescribed  CVC drsg changed exit site WDL no redness or drainage noted

## 2019-04-03 NOTE — ED Notes (Signed)
Dialysis called at this time- state they will be ready for pt around 1720.

## 2019-04-03 NOTE — ED Triage Notes (Signed)
Pt reports was at dialysis but did not get her treatment  Because she felt feverish and like she was getting sick and she had a cough.

## 2019-04-03 NOTE — Progress Notes (Signed)
   04/03/19 1930  Neurological  Level of Consciousness Alert  Orientation Level Oriented X4  Respiratory  Respiratory Pattern Regular  Bilateral Breath Sounds Clear  Cardiac  Pulse Regular  Heart Sounds S1, S2  ECG Monitor Yes  Cardiac Rhythm ST  pt stable for HD CVC WDL no issues voices vitals stable

## 2019-04-03 NOTE — ED Triage Notes (Signed)
First Nurse Note:  Arrives from EMS.  Picked up from dialysis for c/o cough and chest pain with cough.  Tested for COVID on Friday, negative results.  Patient is AAOx3.  No SOB/ DOE

## 2019-04-03 NOTE — Progress Notes (Signed)
This note also relates to the following rows which could not be included: Pulse Rate - Cannot attach notes to unvalidated device data Resp - Cannot attach notes to unvalidated device data BP - Cannot attach notes to unvalidated device data    04/03/19 2245  Vital Signs  Temp 99.9 F (37.7 C)  Temp Source Oral  Pulse Rate Source Monitor  Oxygen Therapy  SpO2 99 %  During Hemodialysis Assessment  Blood Flow Rate (mL/min) 400 mL/min  Arterial Pressure (mmHg) -130 mmHg  Venous Pressure (mmHg) 120 mmHg  Transmembrane Pressure (mmHg) 50 mmHg  Ultrafiltration Rate (mL/min) 330 mL/min  Dialysate Flow Rate (mL/min) 600 ml/min  Conductivity: Machine  13.8  HD Safety Checks Performed Yes  KECN 60.5 KECN  Dialysis Fluid Bolus Normal Saline  Bolus Amount (mL) 250 mL  Intra-Hemodialysis Comments Progressing as prescribed;Tolerated well;Tx completed  Post-Hemodialysis Assessment  Rinseback Volume (mL) 250 mL  KECN 60.5 V  Dialyzer Clearance Clear  Duration of HD Treatment -hour(s) 3 hour(s)  Hemodialysis Intake (mL) 500 mL  UF Total -Machine (mL) 1059 mL  Net UF (mL) 559 mL  Tolerated HD Treatment Yes  tolerated hd tx well vitals stable ufg acheived

## 2019-04-03 NOTE — ED Notes (Signed)
Amlodipine and clonidine held per MD- pt going to dialysis today/ BP wnl per provider.

## 2019-04-03 NOTE — ED Notes (Signed)
Patient transported to CT 

## 2019-04-03 NOTE — ED Notes (Signed)
Cardizem drip paused at this time (36min) per MD, [place].

## 2019-04-03 NOTE — ED Notes (Signed)
Pt assisted to toilet 

## 2019-04-03 NOTE — Consult Note (Signed)
Cardiology Consultation:   Patient ID: Amy [name] MRN: [id number]; DOB: 09-10-1950  Admit date: 04/03/2019 Date of Consult: 04/03/2019  Primary Care Provider: [name], MD Primary Cardiologist: New - Twala Collings Primary Electrophysiologist:  None    Patient Profile:   Amy [name] is a 68 y.o. female with a hx of ESRD on HD, hypertension, anemia or chronic disease, and GERD who is being seen today for the evaluation of tachycardia and atrial flutter at the request of Dr. Juleen China.  History of Present Illness:   Amy [name] reports developing cold symptoms 5 days ago.  Two days ago, she began to experience a pressure across her chest, almost as though her chest wall were sore.  It has been constant but is worsened with deep inspiration and coughing.  She notes a nonproductive cough for the last few days.  Yesterday, her heart rate seemed to increase, which was confirmed at hemodialysis today with heart rates up to 149 bpm.  She has also had intermittent fevers, though she questions the accuracy of her home thermometer.  Given her tachycardia and hemodialysis today, Amy [name] was brought to the ED by EMS and found to be in atrial flutter with variable block.  She was started on IV diltiazem and has since converted to sinus tachycardia with frequent PACs.  She continues to have mild chest discomfort, albeit improved from time of presentation.  Her main complaints at this time are of headache, nausea, and neck soreness (neck soreness is a chronic problem).  Amy [name] denies a history of prior cardiac disease including arrhythmias.  She has not passed out.  She typically does not have chest pain nor shortness of breath.  However, she has been fatigued over the last few days.  Heart Pathway Score:     Past Medical History:  Diagnosis Date  . Anemia    a. 08/2016 Guaiac + stool. EGD w/ gastritis.  . Anxiety   . CKD (chronic kidney disease), stage III   . Colon polyp    a. 08/2016 Colonoscopy.   . Gastritis    a. 08/2016 EGD: Gastritis-->PPI.  [name] GERD (gastroesophageal reflux disease)   . History of chicken pox   . History of measles as a child   . Hypertension   . Hyponatremia    a. 08/2016 in setting of HCTZ Rx.  . Multiple thyroid nodules     Past Surgical History:  Procedure Laterality Date  . ABDOMINAL HYSTERECTOMY  2003   due to heavy periods and clotting during menses  . BREAST LUMPECTOMY Left 1990  . CHOLECYSTECTOMY  2010  . COLONOSCOPY WITH PROPOFOL N/A 09/07/2016   Procedure: COLONOSCOPY WITH PROPOFOL;  Surgeon: Lucilla Lame, MD;  Location[facility] ENDOSCOPY;  Service: Endoscopy;  Laterality: N/A;  . DIALYSIS/PERMA CATHETER INSERTION N/A 11/16/2018   Procedure: DIALYSIS/PERMA CATHETER INSERTION;  Surgeon: Algernon Huxley, MD;  Location: [place] CV LAB;  Service: Cardiovascular;  Laterality: N/A;  . DILATION AND CURETTAGE OF UTERUS  1985  . ESOPHAGOGASTRODUODENOSCOPY (EGD) WITH PROPOFOL N/A 09/07/2016   Procedure: ESOPHAGOGASTRODUODENOSCOPY (EGD) WITH PROPOFOL;  Surgeon: Lucilla Lame, MD;  Location: [facility] ENDOSCOPY;  Service: Endoscopy;  Laterality: N/A;  . GALLBLADDER SURGERY  2012  . KNEE SURGERY     Dr. Tamala Julian  . MRI, Lumbar spine  07/23/2009   Multilevel annular bulge and disc protrusion at L2-L3, L3-L4, L4-L5, and L5-S1  . TONSILLECTOMY    . TONSILLECTOMY AND ADENOIDECTOMY  1970  . UPPER GI ENDOSCOPY  03/02/2006   H. Pylori negative; Dr. Allen Norris     Home Medications:  Prior to Admission medications   Medication Sig Start Date Feliz Lincoln Date Taking? Authorizing Provider  amLODipine (NORVASC) 10 MG tablet Take 10 mg by mouth daily. 02/27/19  Yes [provider]  buPROPion (WELLBUTRIN) 75 MG tablet TAKE 1 TABLET(75 MG) BY MOUTH TWICE DAILY Patient taking differently: Take 75 mg by mouth 2 (two) times daily.  11/25/18  Yes Amy Sons, MD  butalbital-acetaminophen-caffeine (FIORICET) 50-325-40 MG tablet TAKE 1 TO 2 TABLETS BY MOUTH EVERY 4 HOURS AS NEEDED FOR  HEADACHE Patient taking differently: Take 1-2 tablets by mouth every 4 (four) hours as needed for headache.  02/06/19  Yes Amy Sons, MD  calcium acetate (PHOSLO) 667 MG capsule Take 667 mg by mouth 3 (three) times daily with meals. 01/17/19  Yes [provider]  carvedilol (COREG) 25 MG tablet TAKE 1 TABLET(25 MG) BY MOUTH TWICE DAILY WITH A MEAL Patient taking differently: Take 25 mg by mouth 2 (two) times daily with a meal.  12/12/18  Yes Fisher, Kirstie Peri, MD  cloNIDine (CATAPRES) 0.3 MG tablet TAKE 1 TABLET(0.3 MG) BY MOUTH EVERY 8 HOURS Patient taking differently: Take 0.3 mg by mouth 3 (three) times daily.  07/02/18  Yes Amy Sons, MD  esomeprazole (NEXIUM) 20 MG capsule Take 20 mg by mouth daily. 02/20/19  Yes [provider]  hydrALAZINE (APRESOLINE) 50 MG tablet Take 50 mg by mouth 3 (three) times daily. 01/18/19  Yes [provider]  LORazepam (ATIVAN) 1 MG tablet TAKE 1 TABLET(1 MG) BY MOUTH TWICE DAILY AS NEEDED Patient taking differently: Take 1 mg by mouth 2 (two) times daily.  03/06/19  Yes Amy Sons, MD  losartan (COZAAR) 100 MG tablet Take 100 mg by mouth at bedtime. 02/20/19  Yes [provider]  meloxicam (MOBIC) 15 MG tablet Take 15 mg by mouth daily as needed for pain. 02/09/19  Yes [provider]  multivitamin (RENA-VIT) TABS tablet Take 1 tablet by mouth daily. 03/05/19  Yes [provider]  torsemide (DEMADEX) 100 MG tablet Take 100 mg by mouth 2 (two) times daily.    Yes [provider]  zolpidem (AMBIEN) 10 MG tablet TAKE 1/2 TO 1 TABLET(5 TO 10 MG) BY MOUTH AT BEDTIME AS NEEDED Patient taking differently: Take 10 mg by mouth at bedtime as needed for sleep.  02/04/19  Yes Amy Sons, MD  metolazone (ZAROXOLYN) 2.5 MG tablet Take 1 tablet (2.5 mg total) by mouth daily. Patient not taking: Reported on 04/03/2019 11/08/18   Virginia Crews, MD    Inpatient Medications: Scheduled Meds: .  amLODipine  10 mg Oral Daily  . buPROPion  75 mg Oral BID  . [START ON 04/04/2019] carvedilol  25 mg Oral BID WC  . [START ON 04/04/2019] Chlorhexidine Gluconate Cloth  6 each Topical Q0600  . cloNIDine  0.3 mg Oral TID  . [START ON 04/05/2019] epoetin (EPOGEN/PROCRIT) injection  10,000 Units Intravenous Q T,Th,Sa-HD  . heparin  5,000 Units Subcutaneous Q8H  . [START ON 04/05/2019] levofloxacin  500 mg Oral Q48H  . LORazepam  1 mg Oral BID  . metoprolol tartrate  12.5 mg Oral BID  . [START ON 04/04/2019] pantoprazole  40 mg Oral Daily  . torsemide  100 mg Oral BID   Continuous Infusions: . diltiazem (CARDIZEM) infusion 12.5 mg/hr (04/03/19 1717)   PRN Meds: acetaminophen **OR** acetaminophen, bisacodyl, HYDROcodone-acetaminophen, ondansetron **OR** ondansetron (  ZOFRAN) IV, senna-docusate  Allergies:    Allergies  Allergen Reactions  . Cephalosporins   . Clarithromycin   . Lunesta  [Eszopiclone]     Heart racing  . Penicillin V     Has patient had a PCN reaction causing immediate rash, facial/tongue/throat swelling, SOB or lightheadedness with hypotension: Yes Has patient had a PCN reaction causing severe rash involving mucus membranes or skin necrosis: No Has patient had a PCN reaction that required hospitalization No Has patient had a PCN reaction occurring within the last 10 years: No If all of the above answers are "NO", then may proceed with Cephalosporin use.  . Sulfa Antibiotics     Social History:   Social History   Tobacco Use  . Smoking status: Never Smoker  . Smokeless tobacco: Never Used  Substance Use Topics  . Alcohol use: Yes    Alcohol/week: 0.0 - 2.0 standard drinks  . Drug use: No     Family History:   Family History  Problem Relation Age of Onset  . Pancreatic cancer Father   . Congestive Heart Failure Maternal Grandmother   . Cancer Paternal Grandfather   . Heart disease Maternal Uncle      ROS:  Please see the history of present illness. All  other ROS reviewed and negative.     Physical Exam/Data:   Vitals:   04/03/19 1630 04/03/19 1700 04/03/19 1720 04/03/19 1721  BP: 137/77 135/76 135/76 135/76  Pulse: (!) 39 (!) 55  82  Resp: 14 15  18   Temp:    99.8 F (37.7 C)  TempSrc:    Oral  SpO2: 94% 95%  95%  Weight:      Height:        Intake/Output Summary (Last 24 hours) at 04/03/2019 1748 Last data filed at 04/03/2019 1700 Gross per 24 hour  Intake 650 ml  Output -  Net 650 ml   Last 3 Weights 04/03/2019 11/21/2018 11/16/2018  Weight (lbs) 120 lb 112 lb 118 lb  Weight (kg) 54.432 kg 50.803 kg 53.524 kg     Body mass index is 20.6 kg/m.  General:  Well nourished, well developed, in no acute distress.  She is warm to touch. HEENT: normal Lymph: no adenopathy Neck: no JVD Endocrine:  No thryomegaly Vascular: No carotid bruits; 2+ radial and pedal pulses.  Right IJ tunneled dialysis catheter in place. Cardiac: Tachycardic with occasional extrasystoles.  No murmurs, rubs, or gallops. Lungs:  clear to auscultation bilaterally, no wheezing, rhonchi or rales  Abd: soft, nontender, no hepatomegaly  Ext: no edema Musculoskeletal:  No deformities, BUE and BLE strength normal and equal Skin: warm and dry  Neuro:  CNs 2-12 intact, no focal abnormalities noted Psych:  Normal affect   EKG:  The EKG was personally reviewed and demonstrates: Typical atrial flutter with variable block, poor R wave progression, and low voltage. Telemetry:  Telemetry was personally reviewed and demonstrates: Sinus rhythm with PACs.  Brief atrial runs versus MAT noted.  Relevant CV Studies: TTE (09/25/2016): Normal LV size with mild LVH.  LVEF 65-70% with normal wall motion and grade 1 diastolic dysfunction with elevated filling pressures.  Trivial aortic and mild mitral regurgitation.  Normal RV size and function.  Large pleural effusion noted.  Laboratory Data:  High Sensitivity Troponin:   Recent Labs  Lab 04/03/19 1255  TROPONINIHS 13      Chemistry Recent Labs  Lab 04/03/19 1255  NA 132*  K 3.8  CL 95*  CO2 20*  GLUCOSE 124*  BUN 67*  CREATININE 5.87*  CALCIUM 9.0  GFRNONAA 7*  GFRAA 8*  ANIONGAP 17*    No results for input(s): PROT, ALBUMIN, AST, ALT, ALKPHOS, BILITOT in the last 168 hours. Hematology Recent Labs  Lab 04/03/19 1255  WBC 17.9*  RBC 2.79*  HGB 8.1*  HCT 25.0*  MCV 89.6  MCH 29.0  MCHC 32.4  RDW 16.4*  PLT 321   BNP Recent Labs  Lab 04/03/19 1255  BNP 536.0*    DDimer No results for input(s): DDIMER in the last 168 hours.   Radiology/Studies:  Dg Chest 2 View  Result Date: 04/03/2019 CLINICAL DATA:  Chest pain and cough. EXAM: CHEST - 2 VIEW COMPARISON:  12/28/2016 FINDINGS: There is new mild cardiomegaly as well as new small bilateral pleural effusions. Pulmonary vascularity is normal. No infiltrates. Minimal linear atelectasis at the lung bases. Double lumen central venous dialysis catheter appears in good position with the tips at the right atrium. Bones are normal. IMPRESSION: 1. New small bilateral pleural effusions. 2. New mild cardiomegaly. 3. Central venous catheter in good position. Electronically Signed   By: Lorriane Shire M.D.   On: 04/03/2019 12:44    Assessment and Plan:   Atrial flutter: Patient initially noted to have typical atrial flutter with variable AV block, though this has now converted to sinus tachycardia with PACs and brief atrial runs.  I suspect her flutter was driven by acute infection.  Sinus tachycardia which remains is also likely driven by acute infection and fevers.  Continue diltiazem infusion tonight and carvedilol 25 mg twice daily.  Anticipate transitioning diltiazem to p.o. dosing tomorrow after the patient has completed dialysis (scheduled for tonight).  Given history of chronic anemia and low hemoglobin as well as likely brief period of atrial flutter, I favor deferring anticoagulation at this time (CHA2DS2-VASc score equals 3; age, gender,  and hypertension).  Follow-up echocardiogram.  Hypertension: Patient normotensive at this time.  In the setting of infection, there is the potential for her developing sepsis.  Continue carvedilol 25 mg twice daily and diltiazem, as outlined above.  Consider holding amlodipine and clonidine at this time to allow for escalation of rate control medications.  If blood pressure spikes, we may need to restart clonidine at a lower dose to minimize potential for rebound hypertension.  Timtohy Broski-stage renal disease:  Per nephrology and internal medicine.  Febrile illness: Rapid COVID test negative; PCR pending.  Supportive care per internal medicine.  Await COVID-19 PCR.  For questions or updates, please contact [place] Please consult [email or web address] for contact info under [facility] Cardiology.  Signed, Nelva Bush, MD  04/03/2019 5:48 PM

## 2019-04-03 NOTE — ED Notes (Signed)
Dr. Griffith Citron at bedside.

## 2019-04-03 NOTE — ED Notes (Signed)
Patient arrives from dialysis, HR 98 controlled rate

## 2019-04-03 NOTE — Progress Notes (Signed)
[facility]  ROUNDING NOTE   Subjective:   [name] admitted to [facility] on 04/03/2019 for chest pain ems  Last hemodialysis was 11/28, Saturday. She went to dialysis this morning where she was having productive cough, chest pain, palpitations. Transported by EMS. COVID negative.   Objective:  Vital signs in last 24 hours:  Temp:  [99.6 F (37.6 C)] 99.6 F (37.6 C) (12/01 1221) Pulse Rate:  [79-139] 79 (12/01 1300) Resp:  [18] 18 (12/01 1221) BP: (114-139)/(77-83) 139/83 (12/01 1300) SpO2:  [98 %-99 %] 99 % (12/01 1300) Weight:  [54.4 kg] 54.4 kg (12/01 1225)  Weight change:  Filed Weights   04/03/19 1225  Weight: 54.4 kg    Intake/Output: No intake/output data recorded.   Intake/Output this shift:  No intake/output data recorded.  Physical Exam: General: Ill appearing.   Head: Normocephalic, atraumatic. Moist oral mucosal membranes  Eyes: Anicteric, PERRL  Neck: Supple, trachea midline  Lungs:  Rhonchi bilaterally  Heart: irregular  Abdomen:  Soft, nontender,   Extremities:  no peripheral edema.  Neurologic: Nonfocal, moving all four extremities  Skin: No lesions  Access: RIJ permcath    Basic Metabolic Panel: Recent Labs  Lab 04/03/19 1255  NA 132*  K 3.8  CL 95*  CO2 20*  GLUCOSE 124*  BUN 67*  CREATININE 5.87*  CALCIUM 9.0    Liver Function Tests: No results for input(s): AST, ALT, ALKPHOS, BILITOT, PROT, ALBUMIN in the last 168 hours. No results for input(s): LIPASE, AMYLASE in the last 168 hours. No results for input(s): AMMONIA in the last 168 hours.  CBC: Recent Labs  Lab 04/03/19 1255  WBC 17.9*  HGB 8.1*  HCT 25.0*  MCV 89.6  PLT 321    Cardiac Enzymes: No results for input(s): CKTOTAL, CKMB, CKMBINDEX, TROPONINI in the last 168 hours.  BNP: Invalid input(s): POCBNP  CBG: No results for input(s): GLUCAP in the last 168 hours.  Microbiology: Results for orders placed or performed during the hospital  encounter of 03/30/19  Novel Coronavirus, NAA (Hosp order, Send-out to Ref Lab; TAT 18-24 hrs     Status: None   Collection Time: 03/30/19  9:14 PM  Result Value Ref Range Status   SARS-CoV-2, NAA NOT DETECTED NOT DETECTED Final    Comment: (NOTE) This nucleic acid amplification test was developed and its performance characteristics determined by [employer]. Nucleic acid amplification tests include PCR and TMA. This test has not been FDA cleared or approved. This test has been authorized by FDA under an Emergency Use Authorization (EUA). This test is only authorized for the duration of time the declaration that circumstances exist justifying the authorization of the emergency use of in vitro diagnostic tests for detection of SARS-CoV-2 virus and/or diagnosis of COVID-19 infection under section 564(b)(1) of the Act, 21 U.S.C. 332RJJ-8(A) (1), unless the authorization is terminated or revoked sooner. When diagnostic testing is negative, the possibility of a false negative result should be considered in the context of a patient's recent exposures and the presence of clinical signs and symptoms consistent with COVID-19. An individual without symptoms of COVID- 19 and who is not shedding SARS-CoV-2 vi rus would expect to have a negative (not detected) result in this assay. Performed At: [facility] [address] [place], [place] [id number] Rush Farmer MD SW:[id number]    [place]  Final    Comment: Performed at [facility], [place] [address] [place], [place] [address]  Coagulation Studies: Recent Labs    04/03/19 1338  LABPROT 13.0  INR 1.0    Urinalysis: Recent Labs    04/03/19 1356  COLORURINE YELLOW*  LABSPEC 1.017  PHURINE 5.0  GLUCOSEU NEGATIVE  HGBUR NEGATIVE  BILIRUBINUR NEGATIVE  KETONESUR NEGATIVE  PROTEINUR 100*  NITRITE NEGATIVE  LEUKOCYTESUR NEGATIVE      Imaging: Dg Chest 2 View  Result Date:  04/03/2019 CLINICAL DATA:  Chest pain and cough. EXAM: CHEST - 2 VIEW COMPARISON:  12/28/2016 FINDINGS: There is new mild cardiomegaly as well as new small bilateral pleural effusions. Pulmonary vascularity is normal. No infiltrates. Minimal linear atelectasis at the lung bases. Double lumen central venous dialysis catheter appears in good position with the tips at the right atrium. Bones are normal. IMPRESSION: 1. New small bilateral pleural effusions. 2. New mild cardiomegaly. 3. Central venous catheter in good position. Electronically Signed   By: Lorriane Shire M.D.   On: 04/03/2019 12:44     Medications:   . diltiazem (CARDIZEM) infusion    . levofloxacin [facility]) IV        Assessment/ Plan:  [name] is a 68 y.o. white female with end stage renal disease on hemodialysis, hypertension, anxiety, GERD, who is admitted to [facility] on 04/03/2019 for chest pain ems Patient with new onset atrial fibrillation.   [facility] TTS [facility] RIJ permcath 51kg  1. End Stage Renal Disease: missed dialysis this morning. Will scheduled dialysis for today.   2. Hypertension: with new onset atrial fibrillation Home regimen of carvedilol, clonidine, amlodipine, hydralazine, quinapril and torsemide.  Placed on diltiazem this admission - Consult cardiology  3. Secondary Hyperparathyroidism - sevelamer with meals  4. Anemia of chronic kidney disease: Hemoglobin 8.1 - EPO with HD treatment.    LOS: 0 Amy [name] 12/1/20203:00 PM

## 2019-04-03 NOTE — H&P (Signed)
History and Physical    Amy [name] NOM:[id number] DOB: 1950/11/26 DOA: 04/03/2019  PCP: [name], MD Patient coming from: Dialysis center  Chief Complaint: Fever, cough, tachycardia  HPI: Amy [name] is a 68 y.o. female with medical history significant of ESRD on hemodialysis TTS, HTN, anemia of chronic disease, GERD sent to the hospital from dialysis center for evaluation of fever and atrial fibrillation/RVR.  Patient has been having cough/URI type symptoms for the past 5 days.  She went to her last dialysis session on Saturday where she was tested for COVID-19 which was negative.  At home she is often not had some rhinorrhea, subjective fevers and mild productive cough.  Today she went for her dialysis and prior to starting it she was noted to be tachycardic with low-grade fever.  EMS was called to bring her to the hospital.  In the ER she was noted to be in atrial flutter/fibrillation with RVR.  Saturating greater than 95% on room air.  Blood pressure was stable.  She did have low-grade temperature and mild cough.  Chest x-ray showed bilateral small pleural effusion with cardiomegaly.  COVID-19 antigen was negative, PCR was ordered.  Medical team was requested to admit the patient and nephrology team was consulted.  Cardizem drip was also started.  Review of Systems: As per HPI otherwise 10 point review of systems negative.  Review of Systems Otherwise negative except as per HPI, including: General: Denies  night sweats or unintended weight loss. Resp: Denies wheezing, shortness of breath. Cardiac: Denies chest pain, palpitations, orthopnea, paroxysmal nocturnal dyspnea. GI: Denies abdominal pain, nausea, vomiting, diarrhea or constipation GU: Denies dysuria, frequency, hesitancy or incontinence MS: Denies muscle aches, joint pain or swelling Neuro: Denies headache, neurologic deficits (focal weakness, numbness, tingling), abnormal gait Psych: Denies anxiety, depression,  SI/HI/AVH Skin: Denies new rashes or lesions ID: Denies sick contacts, exotic exposures, travel  Past Medical History:  Diagnosis Date  . Anemia    a. 08/2016 Guaiac + stool. EGD w/ gastritis.  . Anxiety   . CKD (chronic kidney disease), stage III   . Colon polyp    a. 08/2016 Colonoscopy.  . Gastritis    a. 08/2016 EGD: Gastritis-->PPI.  [name] GERD (gastroesophageal reflux disease)   . History of chicken pox   . History of measles as a child   . Hypertension   . Hyponatremia    a. 08/2016 in setting of HCTZ Rx.  . Multiple thyroid nodules     Past Surgical History:  Procedure Laterality Date  . ABDOMINAL HYSTERECTOMY  2003   due to heavy periods and clotting during menses  . BREAST LUMPECTOMY Left 1990  . CHOLECYSTECTOMY  2010  . COLONOSCOPY WITH PROPOFOL N/A 09/07/2016   Procedure: COLONOSCOPY WITH PROPOFOL;  Surgeon: Lucilla Lame, MD;  Location: [facility]COPY;  Service: Endoscopy;  Laterality: N/A;  . DIALYSIS/PERMA CATHETER INSERTION N/A 11/16/2018   Procedure: DIALYSIS/PERMA CATHETER INSERTION;  Surgeon: Algernon Huxley, MD;  Location: [facility];  Service: Cardiovascular;  Laterality: N/A;  . DILATION AND CURETTAGE OF UTERUS  1985  . ESOPHAGOGASTRODUODENOSCOPY (EGD) WITH PROPOFOL N/A 09/07/2016   Procedure: ESOPHAGOGASTRODUODENOSCOPY (EGD) WITH PROPOFOL;  Surgeon: Lucilla Lame, MD;  Location: [facility] ENDOSCOPY;  Service: Endoscopy;  Laterality: N/A;  . GALLBLADDER SURGERY  2012  . KNEE SURGERY     Dr. Tamala Julian  . MRI, Lumbar spine  07/23/2009   Multilevel annular bulge and disc protrusion at L2-L3, L3-L4, L4-L5, and L5-S1  .  TONSILLECTOMY    . TONSILLECTOMY AND ADENOIDECTOMY  1970  . UPPER GI ENDOSCOPY  03/02/2006   H. Pylori negative; Dr. Allen Norris    SOCIAL HISTORY:  reports that she has never smoked. She has never used smokeless tobacco. She reports current alcohol use. She reports that she does not use drugs.  Allergies  Allergen Reactions  . Cephalosporins   .  Clarithromycin   . Lunesta  [Eszopiclone]     Heart racing  . Penicillin V     Has patient had a PCN reaction causing immediate rash, facial/tongue/throat swelling, SOB or lightheadedness with hypotension: Yes Has patient had a PCN reaction causing severe rash involving mucus membranes or skin necrosis: No Has patient had a PCN reaction that required hospitalization No Has patient had a PCN reaction occurring within the last 10 years: No If all of the above answers are "NO", then may proceed with Cephalosporin use.  . Sulfa Antibiotics     FAMILY HISTORY: Family History  Problem Relation Age of Onset  . Pancreatic cancer Father   . Congestive Heart Failure Maternal Grandmother   . Cancer Paternal Grandfather   . Heart disease Maternal Uncle      Prior to Admission medications   Medication Sig Start Date End Date Taking? Authorizing Provider  amLODipine (NORVASC) 10 MG tablet Take 10 mg by mouth daily. 02/27/19  Yes [provider]  buPROPion (WELLBUTRIN) 75 MG tablet TAKE 1 TABLET(75 MG) BY MOUTH TWICE DAILY Patient taking differently: Take 75 mg by mouth 2 (two) times daily.  11/25/18  Yes [name], MD  butalbital-acetaminophen-caffeine (FIORICET) 50-325-40 MG tablet TAKE 1 TO 2 TABLETS BY MOUTH EVERY 4 HOURS AS NEEDED FOR HEADACHE Patient taking differently: Take 1-2 tablets by mouth every 4 (four) hours as needed for headache.  02/06/19  Yes [name], MD  calcium acetate (PHOSLO) 667 MG capsule Take 667 mg by mouth 3 (three) times daily with meals. 01/17/19  Yes [provider]  carvedilol (COREG) 25 MG tablet TAKE 1 TABLET(25 MG) BY MOUTH TWICE DAILY WITH A MEAL Patient taking differently: Take 25 mg by mouth 2 (two) times daily with a meal.  12/12/18  Yes Fisher, Kirstie Peri, MD  cloNIDine (CATAPRES) 0.3 MG tablet TAKE 1 TABLET(0.3 MG) BY MOUTH EVERY 8 HOURS Patient taking differently: Take 0.3 mg by mouth 3 (three) times daily.  07/02/18  Yes [name], MD  esomeprazole (NEXIUM) 20 MG capsule Take 20 mg by mouth daily. 02/20/19  Yes [provider]  hydrALAZINE (APRESOLINE) 50 MG tablet Take 50 mg by mouth 3 (three) times daily. 01/18/19  Yes [provider]  LORazepam (ATIVAN) 1 MG tablet TAKE 1 TABLET(1 MG) BY MOUTH TWICE DAILY AS NEEDED Patient taking differently: Take 1 mg by mouth 2 (two) times daily.  03/06/19  Yes [name], MD  losartan (COZAAR) 100 MG tablet Take 100 mg by mouth at bedtime. 02/20/19  Yes [provider]  meloxicam (MOBIC) 15 MG tablet Take 15 mg by mouth daily as needed for pain. 02/09/19  Yes [provider]  multivitamin (RENA-VIT) TABS tablet Take 1 tablet by mouth daily. 03/05/19  Yes [provider]  torsemide (DEMADEX) 100 MG tablet Take 100 mg by mouth 2 (two) times daily.    Yes [provider]  zolpidem (AMBIEN) 10 MG tablet TAKE 1/2 TO 1 TABLET(5 TO 10 MG) BY MOUTH AT BEDTIME AS NEEDED Patient taking differently: Take 10 mg by  mouth at bedtime as needed for sleep.  02/04/19  Yes [name], MD  metolazone (ZAROXOLYN) 2.5 MG tablet Take 1 tablet (2.5 mg total) by mouth daily. Patient not taking: Reported on 04/03/2019 11/08/18   Virginia Crews, MD    Physical Exam: Vitals:   04/03/19 1430 04/03/19 1500 04/03/19 1530 04/03/19 1600  BP: 135/73 (!) 144/78    Pulse:  89    Resp: (!) 9 17 13 17   Temp:      TempSrc:      SpO2:  97%    Weight:      Height:          Constitutional: NAD, calm, comfortable Eyes: PERRL, lids and conjunctivae normal ENMT: Mucous membranes are moist. Posterior pharynx clear of any exudate or lesions.Normal dentition.  Neck: normal, supple, no masses, no thyromegaly Respiratory: Mild bibasilar rhonchi Cardiovascular: Irregularly irregular rhythm, no murmurs / rubs / gallops. No extremity edema. 2+ pedal pulses. No carotid bruits.  Abdomen: no tenderness, no masses palpated. No hepatosplenomegaly. Bowel  sounds positive.  Musculoskeletal: no clubbing / cyanosis. No joint deformity upper and lower extremities. Good ROM, no contractures. Normal muscle tone.  Skin: no rashes, lesions, ulcers. No induration Neurologic: CN 2-12 grossly intact. Sensation intact, DTR normal. Strength 5/5 in all 4.  Psychiatric: Normal judgment and insight. Alert and oriented x 3. Normal mood.   Right chest wall HD catheter noted  Labs on Admission: I have personally reviewed following labs and imaging studies  CBC: Recent Labs  Lab 04/03/19 1255  WBC 17.9*  HGB 8.1*  HCT 25.0*  MCV 89.6  PLT 671   Basic Metabolic Panel: Recent Labs  Lab 04/03/19 1255  NA 132*  K 3.8  CL 95*  CO2 20*  GLUCOSE 124*  BUN 67*  CREATININE 5.87*  CALCIUM 9.0   GFR: Estimated Creatinine Clearance: 7.7 mL/min (A) (by C-G formula based on SCr of 5.87 mg/dL (H)). Liver Function Tests: No results for input(s): AST, ALT, ALKPHOS, BILITOT, PROT, ALBUMIN in the last 168 hours. No results for input(s): LIPASE, AMYLASE in the last 168 hours. No results for input(s): AMMONIA in the last 168 hours. Coagulation Profile: Recent Labs  Lab 04/03/19 1338  INR 1.0   Cardiac Enzymes: No results for input(s): CKTOTAL, CKMB, CKMBINDEX, TROPONINI in the last 168 hours. BNP (last 3 results) No results for input(s): PROBNP in the last 8760 hours. HbA1C: No results for input(s): HGBA1C in the last 72 hours. CBG: No results for input(s): GLUCAP in the last 168 hours. Lipid Profile: No results for input(s): CHOL, HDL, LDLCALC, TRIG, CHOLHDL, LDLDIRECT in the last 72 hours. Thyroid Function Tests: No results for input(s): TSH, T4TOTAL, FREET4, T3FREE, THYROIDAB in the last 72 hours. Anemia Panel: No results for input(s): VITAMINB12, FOLATE, FERRITIN, TIBC, IRON, RETICCTPCT in the last 72 hours. Urine analysis:    Component Value Date/Time   COLORURINE YELLOW (A) 04/03/2019 1356   APPEARANCEUR HAZY (A) 04/03/2019 1356   LABSPEC  1.017 04/03/2019 1356   PHURINE 5.0 04/03/2019 1356   GLUCOSEU NEGATIVE 04/03/2019 1356   HGBUR NEGATIVE 04/03/2019 1356   [place] 04/03/2019 1356   BILIRUBINUR negative 08/31/2018 [place] 04/03/2019 1356   PROTEINUR 100 (A) 04/03/2019 1356   UROBILINOGEN 0.2 08/31/2018 1132   NITRITE NEGATIVE 04/03/2019 1356   LEUKOCYTESUR NEGATIVE 04/03/2019 1356   Sepsis Labs: !!!!!!!!!!!!!!!!!!!!!!!!!!!!!!!!!!!!!!!!!!!! @LABRCNTIP (procalcitonin:4,lacticidven:4) ) Recent Results (from the past 240 hour(s))  Novel Coronavirus, NAA (Hosp order, Send-out  to Ref Lab; TAT 18-24 hrs     Status: None   Collection Time: 03/30/19  9:14 PM  Result Value Ref Range Status   SARS-CoV-2, NAA NOT DETECTED NOT DETECTED Final    Comment: (NOTE) This nucleic acid amplification test was developed and its performance characteristics determined by [employer]. Nucleic acid amplification tests include PCR and TMA. This test has not been FDA cleared or approved. This test has been authorized by FDA under an Emergency Use Authorization (EUA). This test is only authorized for the duration of time the declaration that circumstances exist justifying the authorization of the emergency use of in vitro diagnostic tests for detection of SARS-CoV-2 virus and/or diagnosis of COVID-19 infection under section 564(b)(1) of the Act, 21 U.S.C. 578ION-6(E) (1), unless the authorization is terminated or revoked sooner. When diagnostic testing is negative, the possibility of a false negative result should be considered in the context of a patient's recent exposures and the presence of clinical signs and symptoms consistent with COVID-19. An individual without symptoms of COVID- 19 and who is not shedding SARS-CoV-2 vi rus would expect to have a negative (not detected) result in this assay. Performed At: [facility] [address] [place], [place] [id number] Rush Farmer MD  MW:[phone]    [place]  Final    Comment: Performed at [facility], [place] [address], [place],  [address]     Radiological Exams on Admission: Dg Chest 2 View  Result Date: 04/03/2019 CLINICAL DATA:  Chest pain and cough. EXAM: CHEST - 2 VIEW COMPARISON:  12/28/2016 FINDINGS: There is new mild cardiomegaly as well as new small bilateral pleural effusions. Pulmonary vascularity is normal. No infiltrates. Minimal linear atelectasis at the lung bases. Double lumen central venous dialysis catheter appears in good position with the tips at the right atrium. Bones are normal. IMPRESSION: 1. New small bilateral pleural effusions. 2. New mild cardiomegaly. 3. Central venous catheter in good position. Electronically Signed   By: Lorriane Shire M.D.   On: 04/03/2019 12:44     All images have been reviewed by me personally.  EKG: Independently reviewed.  Atrial fibrillation with RVR  Assessment/Plan Principal Problem:   Fever Active Problems:   ESRD (end stage renal disease) (HCC)   Atrial fibrillation with RVR (HCC)   Benign essential HTN   HLD (hyperlipidemia)    Atrial flutter/fibrillation with RVR -Admit patient to stepdown unit.  1 dose of oral metoprolol given.  IV Cardizem drip for now, will wean off. -Monitor electrolytes, check TSH.  Echocardiogram ordered -Wonder if this is just due to underlying pulmonary condition, if this persists you need to be on long-term anticoagulation. -Supportive care  Cough/fevers/upper respiratory congestion -Nonspecific URI.  Check for COVID-19 PCR.  Antigen-negative.  COVID-19 PCR 11/27-negative -Supportive care.  Check procalcitonin, BNP, MRSA screen -Chest x-ray shows mild bilateral pleural effusion and cardiomegaly -Empiric dose of Levaquin given in the ER, will continue for now due to leukocytosis.  ESRD on hemodialysis TTS -Due for dialysis today.  Nephrology notified.  Essential hypertension  -Currently on Cardizem drip.  Will resume her home medications including Norvasc, Coreg, clonidine  GERD -PPI  DVT prophylaxis: Subcutaneous heparin Code Status: Full code Family Communication: None at bedside Disposition Plan: To be determined Consults called: Nephrology Admission status: Inpatient admission for atrial fibrillation with RVR and upper respiratory symptoms   Time Spent: 65 minutes.  >50% of the time was devoted to discussing the patients care, assessment, plan  and disposition with other care givers along with counseling the patient about the risks and benefits of treatment.    Tonie Elsey Arsenio Loader MD [facility]  If 7PM-7AM, please contact night-coverage   04/03/2019, 4:31 PM

## 2019-04-03 NOTE — Consult Note (Signed)
Pharmacy Antibiotic Note  Amy [name] is a 68 y.o. female admitted on 04/03/2019 with pneumonia.  Pharmacy has been consulted for levofloxacin dosing.  Plan: Levofloxacin 750 mg x 1 followed by 500 mg q48H. Pt is on HD.   Height: 5\' 4"  (162.6 cm) Weight: 120 lb (54.4 kg) IBW/kg (Calculated) : 54.7  Temp (24hrs), Avg:99.6 F (37.6 C), Min:99.6 F (37.6 C), Max:99.6 F (37.6 C)  Recent Labs  Lab 04/03/19 1255 04/03/19 1312  WBC 17.9*  --   CREATININE 5.87*  --   LATICACIDVEN  --  0.9    Estimated Creatinine Clearance: 7.7 mL/min (A) (by C-G formula based on SCr of 5.87 mg/dL (H)).    Allergies  Allergen Reactions  . Cephalosporins   . Clarithromycin   . Lunesta  [Eszopiclone]     Heart racing  . Penicillin V     Has patient had a PCN reaction causing immediate rash, facial/tongue/throat swelling, SOB or lightheadedness with hypotension: Yes Has patient had a PCN reaction causing severe rash involving mucus membranes or skin necrosis: No Has patient had a PCN reaction that required hospitalization No Has patient had a PCN reaction occurring within the last 10 years: No If all of the above answers are "NO", then may proceed with Cephalosporin use.  . Sulfa Antibiotics     Antimicrobials this admission: 12/1 leofloxacin >>    Dose adjustments this admission: None  Microbiology results: 12/1 BCx: pending 12/1 UCx: pending   Thank you for allowing pharmacy to be a part of this patient's care.  Oswald Hillock, PharmD, BCPS 04/03/2019 4:36 PM

## 2019-04-03 NOTE — ED Notes (Signed)
[name], NP regarding cardizem drip that was stopped prior to dialysis. Patient's HR is irregular, HR anywhere between 70-100. Also have orders for lopressor

## 2019-04-03 NOTE — ED Notes (Signed)
ED Provider at bedside. 

## 2019-04-03 NOTE — ED Notes (Signed)
Per Ouma OK to hold off on drip and give lopressor

## 2019-04-03 NOTE — ED Provider Notes (Signed)
[facility] Emergency Department Provider Note       Time seen: ----------------------------------------- 1:05 PM on 04/03/2019 -----------------------------------------   I have reviewed the triage vital signs and the nursing notes.  HISTORY   Chief Complaint Cough, Fever, and Headache   HPI Amy [name] is a 69 y.o. female with a history of anemia, anxiety, chronic kidney disease, gastritis, GERD, hypertension, hyponatremia, renal failure on dialysis who presents to the ED for possible fever, cough and sickness.  Symptoms started on Friday and has been persistent since then.  She typically is scheduled for Tuesday, Thursday and Saturday dialysis.  She is also had chest pain, reportedly negative for Covid on Friday.  Heart rate was found to be atrial flutter when checked in triage.  Past Medical History:  Diagnosis Date  . Anemia    a. 08/2016 Guaiac + stool. EGD w/ gastritis.  . Anxiety   . CKD (chronic kidney disease), stage III   . Colon polyp    a. 08/2016 Colonoscopy.  . Gastritis    a. 08/2016 EGD: Gastritis-->PPI.  [name] GERD (gastroesophageal reflux disease)   . History of chicken pox   . History of measles as a child   . Hypertension   . Hyponatremia    a. 08/2016 in setting of HCTZ Rx.  . Multiple thyroid nodules     Patient Active Problem List   Diagnosis Date Noted  . Itching 11/08/2018  . ARF (acute renal failure) ([place]) 06/19/2018  . Strain of knee 11/24/2017  . Hyperparathyroidism, secondary renal ([place]) 09/13/2017  . Ground glass opacity present on imaging of lung 04/08/2017  . Mesenteric lymphadenopathy 04/08/2017  . Osteoarthritis of knee 12/29/2016  . Normocytic anemia 10/14/2016  . Anemia in chronic renal disease 10/10/2016  . Leg swelling   . Intractable episodic headache   . Gastritis   . History of adenomatous polyp of colon 09/07/2016  . Benign neoplasm of cecum   . Rectal polyp   . Abdominal pain 09/05/2016  . Iron  deficiency anemia 09/05/2016  . Shoulder pain 05/13/2016  . CKD (chronic kidney disease) stage 5, GFR less than 15 ml/min (HCC) 02/05/2016  . Chronic headache 12/11/2015  . Multiple thyroid nodules 07/04/2015  . Right thyroid nodule 06/27/2015  . Allergic rhinitis, seasonal 12/03/2014  . Fever blister 12/03/2014  . Herniated lumbar intervertebral disc 12/03/2014  . Recurrent sinus infections 12/03/2014  . Insomnia 11/05/2014  . Acquired scoliosis 06/26/2014  . Intervertebral disc stenosis of neural canal of lumbar region 06/20/2014  . Lumbar facet arthropathy 05/21/2014  . Degenerative spondylolisthesis 03/12/2014  . OP (osteoporosis) 08/10/2013  . Idiopathic peripheral neuropathy 03/24/2009  . Edema 12/27/2007  . Headache, tension-type 10/25/2006  . Hypertension 10/24/2006  . Anxiety 05/03/1998  . Esophageal reflux 05/03/1998    Past Surgical History:  Procedure Laterality Date  . ABDOMINAL HYSTERECTOMY  2003   due to heavy periods and clotting during menses  . BREAST LUMPECTOMY Left 1990  . CHOLECYSTECTOMY  2010  . COLONOSCOPY WITH PROPOFOL N/A 09/07/2016   Procedure: COLONOSCOPY WITH PROPOFOL;  Surgeon: Lucilla Lame, MD;  Location: [facility] ENDOSCOPY;  Service: Endoscopy;  Laterality: N/A;  . DIALYSIS/PERMA CATHETER INSERTION N/A 11/16/2018   Procedure: DIALYSIS/PERMA CATHETER INSERTION;  Surgeon: Algernon Huxley, MD;  Location: [facility] CV LAB;  Service: Cardiovascular;  Laterality: N/A;  . DILATION AND CURETTAGE OF UTERUS  1985  . ESOPHAGOGASTRODUODENOSCOPY (EGD) WITH PROPOFOL N/A 09/07/2016   Procedure: ESOPHAGOGASTRODUODENOSCOPY (EGD) WITH PROPOFOL;  Surgeon:  Lucilla Lame, MD;  Location: [facility] ENDOSCOPY;  Service: Endoscopy;  Laterality: N/A;  . GALLBLADDER SURGERY  2012  . KNEE SURGERY     Dr. Tamala Julian  . MRI, Lumbar spine  07/23/2009   Multilevel annular bulge and disc protrusion at L2-L3, L3-L4, L4-L5, and L5-S1  . TONSILLECTOMY    . TONSILLECTOMY AND ADENOIDECTOMY  1970  .  UPPER GI ENDOSCOPY  03/02/2006   H. Pylori negative; Dr. Allen Norris    Allergies Cephalosporins, Clarithromycin, Lunesta  [eszopiclone], Penicillin v, and Sulfa antibiotics  Social History Social History   Tobacco Use  . Smoking status: Never Smoker  . Smokeless tobacco: Never Used  Substance Use Topics  . Alcohol use: Yes    Alcohol/week: 0.0 - 2.0 standard drinks  . Drug use: No    Review of Systems Constitutional: Positive for fever Cardiovascular: Positive for chest pain Respiratory: Negative for shortness of breath.  Positive for cough Gastrointestinal: Negative for abdominal pain, vomiting and diarrhea. Musculoskeletal: Negative for back pain. Skin: Negative for rash. Neurological: Negative for headaches, positive for weakness  All systems negative/normal/unremarkable except as stated in the HPI  ____________________________________________   PHYSICAL EXAM:  VITAL SIGNS: ED Triage Vitals  Enc Vitals Group     BP 04/03/19 1221 114/77     Pulse Rate 04/03/19 1221 (!) 139     Resp 04/03/19 1221 18     Temp 04/03/19 1221 99.6 F (37.6 C)     Temp Source 04/03/19 1221 Oral     SpO2 04/03/19 1221 98 %     Weight 04/03/19 1225 120 lb (54.4 kg)     Height 04/03/19 1225 5\' 4"  (1.626 m)     Head Circumference --      Peak Flow --      Pain Score 04/03/19 1225 7     Pain Loc --      Pain Edu? --      Excl. in [place]? --     Constitutional: Alert and oriented. Well appearing and in no distress. Eyes: Conjunctivae are normal. Normal extraocular movements. ENT      Head: Normocephalic and atraumatic.      Nose: No congestion/rhinnorhea.      Mouth/Throat: Mucous membranes are moist.      Neck: No stridor. Cardiovascular: Rapid rate, irregular rhythm. No murmurs, rubs, or gallops. Respiratory: Normal respiratory effort without tachypnea nor retractions.  Scattered rales and rhonchi Gastrointestinal: Soft and nontender. Normal bowel sounds Musculoskeletal: Nontender with  normal range of motion in extremities. No lower extremity tenderness nor edema. Neurologic:  Normal speech and language. No gross focal neurologic deficits are appreciated.  Skin:  Skin is warm, dry and intact. No rash noted. Psychiatric: Mood and affect are normal. Speech and behavior are normal.  ____________________________________________  EKG: Interpreted by me.  Atrial flutter with a variable AV block, rate is 135 bpm, low voltage, normal axis, normal QT  ____________________________________________  ED COURSE:  As part of my medical decision making, I reviewed the following data within the [place] History obtained from family if available, nursing notes, old chart and ekg, as well as notes from prior ED visits. Patient presented for flulike symptoms as well as rapid atrial fibrillation, we will assess with labs and imaging as indicated at this time.   Procedures  Amy [name] was evaluated in Emergency Department on 04/03/2019 for the symptoms described in the history of present illness. She was evaluated in the context of the global  COVID-19 pandemic, which necessitated consideration that the patient might be at risk for infection with the SARS-CoV-2 virus that causes COVID-19. Institutional protocols and algorithms that pertain to the evaluation of patients at risk for COVID-19 are in a state of rapid change based on information released by regulatory bodies including the CDC and federal and state organizations. These policies and algorithms were followed during the patient's care in the ED.  ____________________________________________   LABS (pertinent positives/negatives)  Labs Reviewed  BASIC METABOLIC PANEL  CBC  TROPONIN I (HIGH SENSITIVITY)   CRITICAL CARE Performed by: Laurence Aly   Total critical care time: 30 minutes  Critical care time was exclusive of separately billable procedures and treating other patients.  Critical care was  necessary to treat or prevent imminent or life-threatening deterioration.  Critical care was time spent personally by me on the following activities: development of treatment plan with patient and/or surrogate as well as nursing, discussions with consultants, evaluation of patient's response to treatment, examination of patient, obtaining history from patient or surrogate, ordering and performing treatments and interventions, ordering and review of laboratory studies, ordering and review of radiographic studies, pulse oximetry and re-evaluation of patient's condition.  RADIOLOGY Images were viewed by me  Chest x-ray IMPRESSION: 1. New small bilateral pleural effusions. 2. New mild cardiomegaly. 3. Central venous catheter in good position. ____________________________________________   DIFFERENTIAL DIAGNOSIS   CHF, COPD, pneumonia, arrhythmia, MI, atrial fibrillation, COVID-19  FINAL ASSESSMENT AND PLAN  Fever, cough, atrial flutter   Plan: The patient had presented for fever, cough but was noted to be in atrial flutter with a rapid ventricular response. Patient's labs did reveal leukocytosis but normal lactic acid level concerning for infection but doubtful for sepsis. Patient's imaging revealed small bilateral pleural effusions without any obvious pneumonia.  I did give her Levaquin for bacterial pneumonia coverage, initial coronavirus testing has been negative.  I placed her on a Cardizem drip for rate control and I discussed with the hospitalist and nephrology.   Laurence Aly, MD    Note: This note was generated in part or whole with voice recognition software. Voice recognition is usually quite accurate but there are transcription errors that can and very often do occur. I apologize for any typographical errors that were not detected and corrected.     Earleen Newport, MD 04/03/19 [id number]

## 2019-04-03 NOTE — ED Notes (Signed)
Pt states she went to dialysis center today (goes Tuesday, Thursday, Saturday) and HR was in 140's so they called EMS. Pt reports dry cough since Saturday. Pt aox4. NAD noted.

## 2019-04-04 ENCOUNTER — Inpatient Hospital Stay (HOSPITAL_COMMUNITY)
Admit: 2019-04-04 | Discharge: 2019-04-04 | Disposition: A | Discharge status: Home / Self Care | Payer: Medicare Other | Attending: Internal Medicine | Admitting: Internal Medicine

## 2019-04-04 DIAGNOSIS — I4891 Unspecified atrial fibrillation: Secondary | ICD-10-CM

## 2019-04-04 DIAGNOSIS — I4892 Unspecified atrial flutter: Secondary | ICD-10-CM

## 2019-04-04 LAB — PROTIME-INR
INR: 1.2 (ref 0.8–1.2)
Prothrombin Time: 15.1 seconds (ref 11.4–15.2)

## 2019-04-04 LAB — CBC
HCT: 20.5 % — ABNORMAL LOW (ref 36.0–46.0)
Hemoglobin: 6.9 g/dL — ABNORMAL LOW (ref 12.0–15.0)
MCH: 28.9 pg (ref 26.0–34.0)
MCHC: 33.7 g/dL (ref 30.0–36.0)
MCV: 85.8 fL (ref 80.0–100.0)
Platelets: 233 10*3/uL (ref 150–400)
RBC: 2.39 MIL/uL — ABNORMAL LOW (ref 3.87–5.11)
RDW: 16.5 % — ABNORMAL HIGH (ref 11.5–15.5)
WBC: 9.6 10*3/uL (ref 4.0–10.5)
nRBC: 0 % (ref 0.0–0.2)

## 2019-04-04 LAB — GLUCOSE, CAPILLARY
Glucose-Capillary: 109 mg/dL — ABNORMAL HIGH (ref 70–99)
Glucose-Capillary: 109 mg/dL — ABNORMAL HIGH (ref 70–99)

## 2019-04-04 LAB — COMPREHENSIVE METABOLIC PANEL
ALT: 22 U/L (ref 0–44)
AST: 20 U/L (ref 15–41)
Albumin: 3 g/dL — ABNORMAL LOW (ref 3.5–5.0)
Alkaline Phosphatase: 134 U/L — ABNORMAL HIGH (ref 38–126)
Anion gap: 13 (ref 5–15)
BUN: 25 mg/dL — ABNORMAL HIGH (ref 8–23)
CO2: 25 mmol/L (ref 22–32)
Calcium: 8.7 mg/dL — ABNORMAL LOW (ref 8.9–10.3)
Chloride: 98 mmol/L (ref 98–111)
Creatinine, Ser: 2.82 mg/dL — ABNORMAL HIGH (ref 0.44–1.00)
GFR calc Af Amer: 19 mL/min — ABNORMAL LOW (ref >60)
GFR calc non Af Amer: 16 mL/min — ABNORMAL LOW (ref >60)
Glucose, Bld: 112 mg/dL — ABNORMAL HIGH (ref 70–99)
Potassium: 3.5 mmol/L (ref 3.5–5.1)
Sodium: 136 mmol/L (ref 135–145)
Total Bilirubin: 0.8 mg/dL (ref 0.3–1.2)
Total Protein: 5.9 g/dL — ABNORMAL LOW (ref 6.5–8.1)

## 2019-04-04 LAB — PREPARE RBC (CROSSMATCH)

## 2019-04-04 LAB — ECHOCARDIOGRAM COMPLETE
Height: 64 in
Weight: 1851.86 oz

## 2019-04-04 LAB — SARS CORONAVIRUS 2 (TAT 6-24 HRS): SARS Coronavirus 2: NEGATIVE

## 2019-04-04 LAB — MRSA PCR SCREENING: MRSA by PCR: NEGATIVE

## 2019-04-04 LAB — APTT: aPTT: 42 seconds — ABNORMAL HIGH (ref 24–36)

## 2019-04-04 MED ORDER — ZOLPIDEM TARTRATE 5 MG PO TABS
5.0000 mg | ORAL_TABLET | Freq: Every evening | ORAL | Status: DC | PRN
  Administered 2019-04-04 – 2019-04-05 (×2): 5 mg via ORAL
  Filled 2019-04-04 (×2): qty 1

## 2019-04-04 MED ORDER — SODIUM CHLORIDE 0.9% IV SOLUTION: Freq: Once | INTRAVENOUS | Status: DC

## 2019-04-04 MED ORDER — SODIUM CHLORIDE 0.9% IV SOLUTION
Freq: Once | INTRAVENOUS | Status: AC
  Administered 2019-04-04: 13:00:00 via INTRAVENOUS

## 2019-04-04 MED ORDER — METOPROLOL TARTRATE 50 MG PO TABS
50.0000 mg | ORAL_TABLET | Freq: Two times a day (BID) | ORAL | Status: DC
  Administered 2019-04-04 (×2): 50 mg via ORAL
  Filled 2019-04-04 (×2): qty 1

## 2019-04-04 NOTE — Progress Notes (Signed)
Brief Hx:  Amy [name] is a 68 y.o. female with medical history significant of ESRD on hemodialysis TTS, HTN, anemia of chronic disease, GERD sent to the hospital from dialysis center for evaluation of fever and atrial fibrillation/RVR.  Patient has been having cough/URI type symptoms for the past 5 days.  She went to her last dialysis session on Saturday where she was tested for COVID-19 which was negative.  At home she is often not had some rhinorrhea, subjective fevers and mild productive cough.  Today she went for her dialysis and prior to starting it she was noted to be tachycardic with low-grade fever.  EMS was called to bring her to the hospital. In the ER she was noted to be in atrial flutter/fibrillation with RVR.  Saturating greater than 95% on room air.  Blood pressure was stable.  She did have low-grade temperature and mild cough.  Chest x-ray showed bilateral small pleural effusion with cardiomegaly.  COVID-19 antigen was negative, PCR was ordered.  Medical team was requested to admit the patient and nephrology team was consulted.  Cardizem drip was also started.  Subjective: Denies any acute issues this morning.  Status post Cardizem drip.  Was wondering why her hemoglobin so low.  Agreed to receive blood start blood transfusion  if deemed necessary.  Objective: Vital signs in last 24 hours: Temp:  [99.3 F (37.4 C)-101.1 F (38.4 C)] 99.3 F (37.4 C) (12/02 0800) Pulse Rate:  [29-118] 87 (12/02 1200) Resp:  [9-22] 14 (12/02 1200) BP: (103-149)/(61-109) 128/61 (12/02 1200) SpO2:  [91 %-100 %] 93 % (12/02 1200) Weight:  [52.5 kg] 52.5 kg (12/02 0200)  Intake/Output from previous day: 12/01 0701 - 12/02 0700 In: 650  Out: 559  Intake/Output this shift: Total I/O In: 120 [P.O.:120] Out: 0   Constitutional: NAD, calm, comfortable Eyes: PERRL, lids and conjunctivae normal ENMT: Mucous membranes are moist. Posterior pharynx clear of any exudate or lesions.Normal dentition.   Neck: normal, supple, no masses, no thyromegaly Respiratory: Mild bibasilar rhonchi Cardiovascular:  Regular rate and rhythm; no murmurs / rubs / gallops. No extremity edema. 2+ pedal pulses. No carotid bruits.  Abdomen: no tenderness, no masses palpated. No hepatosplenomegaly. Bowel sounds positive.  Musculoskeletal: no clubbing / cyanosis. No joint deformity upper and lower extremities. Good ROM, no contractures. Normal muscle tone.  Skin: no rashes, lesions, ulcers. No induration Neurologic: CN 2-12 grossly intact. Sensation intact, DTR normal. Strength 5/5 in all 4.  Psychiatric: Normal judgment and insight. Alert and oriented x 3. Normal mood.   Results for orders placed or performed during the hospital encounter of 04/03/19 (from the past 24 hour(s))  Lactic acid, plasma     Status: None   Collection Time: 04/03/19  1:12 PM  Result Value Ref Range   Lactic Acid, Venous 0.9 0.5 - 1.9 mmol/L  Protime-INR     Status: None   Collection Time: 04/03/19  1:38 PM  Result Value Ref Range   Prothrombin Time 13.0 11.4 - 15.2 seconds   INR 1.0 0.8 - 1.2  APTT     Status: None   Collection Time: 04/03/19  1:38 PM  Result Value Ref Range   aPTT 32 24 - 36 seconds  Urinalysis, Complete w Microscopic     Status: Abnormal   Collection Time: 04/03/19  1:56 PM  Result Value Ref Range   Color, Urine YELLOW (A) YELLOW   APPearance HAZY (A) CLEAR   Specific Gravity, Urine 1.017 1.005 - 1.030  pH 5.0 5.0 - 8.0   Glucose, UA NEGATIVE NEGATIVE mg/dL   Hgb urine dipstick NEGATIVE NEGATIVE   Bilirubin Urine NEGATIVE NEGATIVE   Ketones, ur NEGATIVE NEGATIVE mg/dL   Protein, ur 100 (A) NEGATIVE mg/dL   Nitrite NEGATIVE NEGATIVE   Leukocytes,Ua NEGATIVE NEGATIVE   RBC / HPF 0-5 0 - 5 RBC/hpf   WBC, UA 0-5 0 - 5 WBC/hpf   Bacteria, UA NONE SEEN NONE SEEN   Squamous Epithelial / LPF 0-5 0 - 5   Hyaline Casts, UA PRESENT    Amorphous Crystal PRESENT   POC SARS Coronavirus 2 Ag     Status: None    Collection Time: 04/03/19  2:21 PM  Result Value Ref Range   SARS Coronavirus 2 Ag NEGATIVE NEGATIVE  SARS CORONAVIRUS 2 (TAT 6-24 HRS) Nasopharyngeal Nasopharyngeal Swab     Status: None   Collection Time: 04/03/19  3:33 PM   Specimen: Nasopharyngeal Swab  Result Value Ref Range   SARS Coronavirus 2 NEGATIVE NEGATIVE  Troponin I (High Sensitivity)     Status: None   Collection Time: 04/03/19  5:18 PM  Result Value Ref Range   Troponin I (High Sensitivity) 12 <18 ng/L  CBC     Status: Abnormal   Collection Time: 04/03/19  5:18 PM  Result Value Ref Range   WBC 16.0 (H) 4.0 - 10.5 K/uL   RBC 2.42 (L) 3.87 - 5.11 MIL/uL   Hemoglobin 7.1 (L) 12.0 - 15.0 g/dL   HCT 20.9 (L) 36.0 - 46.0 %   MCV 86.4 80.0 - 100.0 fL   MCH 29.3 26.0 - 34.0 pg   MCHC 34.0 30.0 - 36.0 g/dL   RDW 16.3 (H) 11.5 - 15.5 %   Platelets 271 150 - 400 K/uL   nRBC 0.0 0.0 - 0.2 %  Creatinine, serum     Status: Abnormal   Collection Time: 04/03/19  5:18 PM  Result Value Ref Range   Creatinine, Ser 5.81 (H) 0.44 - 1.00 mg/dL   GFR calc non Af Amer 7 (L) >60 mL/min   GFR calc Af Amer 8 (L) >60 mL/min  Glucose, capillary     Status: Abnormal   Collection Time: 04/04/19  2:02 AM  Result Value Ref Range   Glucose-Capillary 109 (H) 70 - 99 mg/dL  MRSA PCR Screening     Status: None   Collection Time: 04/04/19  2:10 AM   Specimen: Nasal Mucosa; Nasopharyngeal  Result Value Ref Range   MRSA by PCR NEGATIVE NEGATIVE  Comprehensive metabolic panel     Status: Abnormal   Collection Time: 04/04/19  5:03 AM  Result Value Ref Range   Sodium 136 135 - 145 mmol/L   Potassium 3.5 3.5 - 5.1 mmol/L   Chloride 98 98 - 111 mmol/L   CO2 25 22 - 32 mmol/L   Glucose, Bld 112 (H) 70 - 99 mg/dL   BUN 25 (H) 8 - 23 mg/dL   Creatinine, Ser 2.82 (H) 0.44 - 1.00 mg/dL   Calcium 8.7 (L) 8.9 - 10.3 mg/dL   Total Protein 5.9 (L) 6.5 - 8.1 g/dL   Albumin 3.0 (L) 3.5 - 5.0 g/dL   AST 20 15 - 41 U/L   ALT 22 0 - 44 U/L   Alkaline  Phosphatase 134 (H) 38 - 126 U/L   Total Bilirubin 0.8 0.3 - 1.2 mg/dL   GFR calc non Af Amer 16 (L) >60 mL/min   GFR calc Af Amer 19 (  L) >60 mL/min   Anion gap 13 5 - 15  CBC     Status: Abnormal   Collection Time: 04/04/19  5:03 AM  Result Value Ref Range   WBC 9.6 4.0 - 10.5 K/uL   RBC 2.39 (L) 3.87 - 5.11 MIL/uL   Hemoglobin 6.9 (L) 12.0 - 15.0 g/dL   HCT 20.5 (L) 36.0 - 46.0 %   MCV 85.8 80.0 - 100.0 fL   MCH 28.9 26.0 - 34.0 pg   MCHC 33.7 30.0 - 36.0 g/dL   RDW 16.5 (H) 11.5 - 15.5 %   Platelets 233 150 - 400 K/uL   nRBC 0.0 0.0 - 0.2 %  Protime-INR     Status: None   Collection Time: 04/04/19  5:03 AM  Result Value Ref Range   Prothrombin Time 15.1 11.4 - 15.2 seconds   INR 1.2 0.8 - 1.2  APTT     Status: Abnormal   Collection Time: 04/04/19  5:03 AM  Result Value Ref Range   aPTT 42 (H) 24 - 36 seconds  Glucose, capillary     Status: Abnormal   Collection Time: 04/04/19  7:55 AM  Result Value Ref Range   Glucose-Capillary 109 (H) 70 - 99 mg/dL    Studies/Results: Dg Chest 2 View  Result Date: 04/03/2019 CLINICAL DATA:  Chest pain and cough. EXAM: CHEST - 2 VIEW COMPARISON:  12/28/2016 FINDINGS: There is new mild cardiomegaly as well as new small bilateral pleural effusions. Pulmonary vascularity is normal. No infiltrates. Minimal linear atelectasis at the lung bases. Double lumen central venous dialysis catheter appears in good position with the tips at the right atrium. Bones are normal. IMPRESSION: 1. New small bilateral pleural effusions. 2. New mild cardiomegaly. 3. Central venous catheter in good position. Electronically Signed   By: Lorriane Shire M.D.   On: 04/03/2019 12:44    Scheduled Meds: . sodium chloride   Intravenous Once  . buPROPion  75 mg Oral BID  . Chlorhexidine Gluconate Cloth  6 each Topical Q0600  . [START ON 04/05/2019] epoetin (EPOGEN/PROCRIT) injection  10,000 Units Intravenous Q T,Th,Sa-HD  . heparin  5,000 Units Subcutaneous Q8H  .  [START ON 04/05/2019] levofloxacin  500 mg Oral Q48H  . LORazepam  1 mg Oral BID  . metoprolol tartrate  50 mg Oral BID  . pantoprazole  40 mg Oral Daily  . torsemide  100 mg Oral BID   Continuous Infusions: . diltiazem (CARDIZEM) infusion Stopped (04/03/19 1826)   PRN Meds:acetaminophen **OR** acetaminophen, bisacodyl, HYDROcodone-acetaminophen, ondansetron **OR** ondansetron (ZOFRAN) IV, senna-docusate  Assessment/Plan: Principal Problem:   Fever Active Problems:   ESRD (end stage renal disease) (HCC)   Atrial fibrillation with RVR (HCC)   Benign essential HTN   HLD (hyperlipidemia)    Atrial flutter/fibrillation with RVR -Converted to normal sinus rhythm this morning -Status post  IV Cardizem drip   -Started patient on Lopressor 50 mg twice daily as per cardiology and titrate for heart rate control  Appreciate cardiology recommendation  -Continue to monitor electrolytes, check TSH.   - Echocardiogram report pending -Continue to monitor in the stepdown unit for another 24 hours  Cough/fevers/upper respiratory congestion -Nonspecific URI.  - COVID-19  Antigen-negative.  COVID-19 PCR negative again on 04/03/2019 -Supportive care  -Chest x-ray shows mild bilateral pleural effusion and cardiomegaly -May improved following hemodialysis which is to be received on Tuesday Thursday and Saturday -Empiric dose of Levaquin given in the ER, will continue for now due to  leukocytosis.  ESRD on hemodialysis TTS -  Nephrology notified.  Patient receives hemodialysis on Tuesday Thursday and Saturday  Essential hypertension -Stable -  Will resume her home medications including Norvasc, clonidine when when needed  Anemia: Appears to be anemia of chronic diseases Hemoglobin found to be 6.9 this morning compared to 7.1 on admission We will transfuse 1 unit of PRBC today Patient may need Epogen shot as per nephrology  GERD -PPI  DVT prophylaxis: Subcutaneous heparin Code Status:  Full code Family Communication: None at bedside Disposition Plan: To be determined Consults called: Nephrology   LOS: 1 day   Morganna Styles Izetta Dakin

## 2019-04-04 NOTE — Progress Notes (Signed)
Progress Note  Patient Name: Amy [name] Date of Encounter: 04/04/2019  Primary Cardiologist: New to [facility] - consult by End  Subjective   No chest pain, shortness of breath, palpitations.  Patient would like she was getting a URI leading up to her presentation with dry cough, nasal congestion, and rhinorrhea.  Remains in sinus rhythm.  Off diltiazem drip.  Blood pressure in the low 696E systolic currently.  Rapid Covid test negative in the ED.  Awaiting COVID-19 PCR.  Cultures pending.  Inpatient Medications    Scheduled Meds: . buPROPion  75 mg Oral BID  . carvedilol  25 mg Oral BID WC  . Chlorhexidine Gluconate Cloth  6 each Topical Q0600  . [START ON 04/05/2019] epoetin (EPOGEN/PROCRIT) injection  10,000 Units Intravenous Q T,Th,Sa-HD  . heparin  5,000 Units Subcutaneous Q8H  . [START ON 04/05/2019] levofloxacin  500 mg Oral Q48H  . LORazepam  1 mg Oral BID  . metoprolol tartrate  12.5 mg Oral BID  . pantoprazole  40 mg Oral Daily  . torsemide  100 mg Oral BID   Continuous Infusions: . diltiazem (CARDIZEM) infusion Stopped (04/03/19 1826)   PRN Meds: acetaminophen **OR** acetaminophen, bisacodyl, HYDROcodone-acetaminophen, ondansetron **OR** ondansetron (ZOFRAN) IV, senna-docusate   Vital Signs    Vitals:   04/04/19 0400 04/04/19 0500 04/04/19 0600 04/04/19 0741  BP: 115/73 (!) 141/71 134/68 136/72  Pulse: 94 86 92 97  Resp: 11 20 18    Temp:      TempSrc:      SpO2: 98% 98% 97%   Weight:      Height:        Intake/Output Summary (Last 24 hours) at 04/04/2019 0803 Last data filed at 04/03/2019 2245 Gross per 24 hour  Intake 650 ml  Output 559 ml  Net 91 ml   Filed Weights   04/03/19 1225 04/04/19 0200  Weight: 54.4 kg 52.5 kg    Telemetry    Sinus rhythm, 90 bpm- Personally Reviewed  ECG    No new tracings- Personally Reviewed  Physical Exam   GEN: No acute distress.   Neck: No JVD. Cardiac: RRR, no murmurs, rubs, or gallops.  Respiratory:  Clear to auscultation bilaterally.  GI: Soft, nontender, non-distended.   MS: No edema; No deformity. Neuro:  Alert and oriented x 3; Nonfocal.  Psych: Normal affect.  Labs    Chemistry Recent Labs  Lab 04/03/19 1255 04/03/19 1718 04/04/19 0503  NA 132*  --  136  K 3.8  --  3.5  CL 95*  --  98  CO2 20*  --  25  GLUCOSE 124*  --  112*  BUN 67*  --  25*  CREATININE 5.87* 5.81* 2.82*  CALCIUM 9.0  --  8.7*  PROT  --   --  5.9*  ALBUMIN  --   --  3.0*  AST  --   --  20  ALT  --   --  22  ALKPHOS  --   --  134*  BILITOT  --   --  0.8  GFRNONAA 7* 7* 16*  GFRAA 8* 8* 19*  ANIONGAP 17*  --  13     Hematology Recent Labs  Lab 04/03/19 1255 04/03/19 1718 04/04/19 0503  WBC 17.9* 16.0* 9.6  RBC 2.79* 2.42* 2.39*  HGB 8.1* 7.1* 6.9*  HCT 25.0* 20.9* 20.5*  MCV 89.6 86.4 85.8  MCH 29.0 29.3 28.9  MCHC 32.4 34.0 33.7  RDW 16.4*  16.3* 16.5*  PLT 321 271 233    Cardiac EnzymesNo results for input(s): TROPONINI in the last 168 hours. No results for input(s): TROPIPOC in the last 168 hours.   BNP Recent Labs  Lab 04/03/19 1255  BNP 536.0*     DDimer No results for input(s): DDIMER in the last 168 hours.   Radiology    Dg Chest 2 View  Result Date: 04/03/2019 IMPRESSION: 1. New small bilateral pleural effusions. 2. New mild cardiomegaly. 3. Central venous catheter in good position. Electronically Signed   By: Lorriane Shire M.D.   On: 04/03/2019 12:44    Cardiac Studies   Echo pending  Patient Profile     68 y.o. female with history of ESRD on HD, HTN, anemia of chronic disease, and GERD who we are seeing for evaluation of atrial flutter in the setting of febrile illness.  Assessment & Plan    1.  Atrial flutter with RVR: -In the ED, patient initially noted to have typical atrial flutter with variable AV block with subsequent spontaneous conversion to sinus tachycardia with PACs and brief atrial runs felt to be in the setting of the patient's acute febrile  illness -She is maintaining sinus rhythm with heart rates in the 90s bpm this morning -BP soft in the low 665L systolic -Discontinue carvedilol -Start Lopressor 50 mg twice daily with titration as indicated, as directed by MD -Off diltiazem drip this morning with BP in the low 935T systolic -Anticoagulation has been deferred at this time in the setting of patient's history of anemia chronic disease and likely brief.  Of atrial flutter in the setting of acute illness -CHADS2VASc of 3 (HTN, age x1, sex category) -Recommend outpatient cardiac monitoring in follow-up to evaluate for atrial flutter burden -If she is found to have recurrent episodes would need to revisit full dose anticoagulation at that time -Follow-up echo when completed  2.  HTN: -BP well controlled -Transition from carvedilol to Lopressor as above  3.  ESRD: -HD per nephrology  4.  Febrile illness: -T-max  101.1 -Rapid COVID-19 negative in the ED, await COVID-19 PCR -Cultures pending -Supportive care per internal medicine  5.  Anemia of chronic disease: -Hemoglobin 6.9 this morning -Per IM/nephrology   For questions or updates, please contact [place] Please consult [email or web address] for contact info under Cardiology/STEMI.    Signed, Christell Faith, PA-C [place] Pager: [phone] 04/04/2019, 8:03 AM

## 2019-04-04 NOTE — Progress Notes (Signed)
Report given to Theador Hawthorne RN ICU

## 2019-04-04 NOTE — Progress Notes (Signed)
[facility]  ROUNDING NOTE   Subjective:   Hemodialysis treatment last night. Tolerated treatment well. UF of 554mL  Tmax 101.1  dilitazem gtt  Objective:  Vital signs in last 24 hours:  Temp:  [99.3 F (37.4 C)-101.1 F (38.4 C)] 99.3 F (37.4 C) (12/02 0800) Pulse Rate:  [29-118] 87 (12/02 1200) Resp:  [9-22] 14 (12/02 1200) BP: (103-149)/(61-109) 128/61 (12/02 1200) SpO2:  [91 %-100 %] 93 % (12/02 1200) Weight:  [52.5 kg] 52.5 kg (12/02 0200)  Weight change:  Filed Weights   04/03/19 1225 04/04/19 0200  Weight: 54.4 kg 52.5 kg    Intake/Output: I/O last 3 completed shifts: In: 650 [IV Piggyback:650] Out: 559 [Other:559]   Intake/Output this shift:  Total I/O In: 120 [P.O.:120] Out: 0   Physical Exam: General: Ill    Head: Normocephalic, atraumatic. Moist oral mucosal membranes  Eyes: Anicteric, PERRL  Neck: Supple, trachea midline  Lungs:  Rhonchi bilaterally  Heart: regular  Abdomen:  Soft, nontender,   Extremities:  no peripheral edema.  Neurologic: Nonfocal, moving all four extremities  Skin: No lesions  Access: RIJ permcath    Basic Metabolic Panel: Recent Labs  Lab 04/03/19 1255 04/03/19 1718 04/04/19 0503  NA 132*  --  136  K 3.8  --  3.5  CL 95*  --  98  CO2 20*  --  25  GLUCOSE 124*  --  112*  BUN 67*  --  25*  CREATININE 5.87* 5.81* 2.82*  CALCIUM 9.0  --  8.7*    Liver Function Tests: Recent Labs  Lab 04/04/19 0503  AST 20  ALT 22  ALKPHOS 134*  BILITOT 0.8  PROT 5.9*  ALBUMIN 3.0*   No results for input(s): LIPASE, AMYLASE in the last 168 hours. No results for input(s): AMMONIA in the last 168 hours.  CBC: Recent Labs  Lab 04/03/19 1255 04/03/19 1718 04/04/19 0503  WBC 17.9* 16.0* 9.6  HGB 8.1* 7.1* 6.9*  HCT 25.0* 20.9* 20.5*  MCV 89.6 86.4 85.8  PLT 321 271 233    Cardiac Enzymes: No results for input(s): CKTOTAL, CKMB, CKMBINDEX, TROPONINI in the last 168 hours.  BNP: Invalid input(s):  POCBNP  CBG: Recent Labs  Lab 04/04/19 0202 04/04/19 0755  GLUCAP 109* 109*    Microbiology: Results for orders placed or performed during the hospital encounter of 04/03/19  SARS CORONAVIRUS 2 (TAT 6-24 HRS) Nasopharyngeal Nasopharyngeal Swab     Status: None   Collection Time: 04/03/19  3:33 PM   Specimen: Nasopharyngeal Swab  Result Value Ref Range Status   SARS Coronavirus 2 NEGATIVE NEGATIVE Final    Comment: (NOTE) SARS-CoV-2 target nucleic acids are NOT DETECTED. The SARS-CoV-2 RNA is generally detectable in upper and lower respiratory specimens during the acute phase of infection. Negative results do not preclude SARS-CoV-2 infection, do not rule out co-infections with other pathogens, and should not be used as the sole basis for treatment or other patient management decisions. Negative results must be combined with clinical observations, patient history, and epidemiological information. The expected result is Negative. Fact Sheet for Patients: SugarRoll.be Fact Sheet for Healthcare Providers: https://www.woods-mathews.com/ This test is not yet approved or cleared by the [place] FDA and  has been authorized for detection and/or diagnosis of SARS-CoV-2 by FDA under an Emergency Use Authorization (EUA). This EUA will remain  in effect (meaning this test can be used) for the duration of the COVID-19 declaration under Section 56 4(b)(1) of the Act,  21 U.S.C. section 360bbb-3(b)(1), unless the authorization is terminated or revoked sooner. Performed at [facility], [place] [name] [place], [place] [address]   MRSA PCR Screening     Status: None   Collection Time: 04/04/19  2:10 AM   Specimen: Nasal Mucosa; Nasopharyngeal  Result Value Ref Range Status   MRSA by PCR NEGATIVE NEGATIVE Final    Comment:        The GeneXpert MRSA Assay (FDA approved for NASAL specimens only), is one component of a comprehensive  MRSA colonization surveillance program. It is not intended to diagnose MRSA infection nor to guide or monitor treatment for MRSA infections. Performed at [facility], [address] [place], [place] [address]     Coagulation Studies: Recent Labs    04/03/19 1338 04/04/19 0503  LABPROT 13.0 15.1  INR 1.0 1.2    Urinalysis: Recent Labs    04/03/19 1356  COLORURINE YELLOW*  LABSPEC 1.017  PHURINE 5.0  GLUCOSEU NEGATIVE  HGBUR NEGATIVE  BILIRUBINUR NEGATIVE  KETONESUR NEGATIVE  PROTEINUR 100*  NITRITE NEGATIVE  LEUKOCYTESUR NEGATIVE      Imaging: Dg Chest 2 View  Result Date: 04/03/2019 CLINICAL DATA:  Chest pain and cough. EXAM: CHEST - 2 VIEW COMPARISON:  12/28/2016 FINDINGS: There is new mild cardiomegaly as well as new small bilateral pleural effusions. Pulmonary vascularity is normal. No infiltrates. Minimal linear atelectasis at the lung bases. Double lumen central venous dialysis catheter appears in good position with the tips at the right atrium. Bones are normal. IMPRESSION: 1. New small bilateral pleural effusions. 2. New mild cardiomegaly. 3. Central venous catheter in good position. Electronically Signed   By: Lorriane Shire M.D.   On: 04/03/2019 12:44     Medications:   . diltiazem (CARDIZEM) infusion Stopped (04/03/19 1826)   . buPROPion  75 mg Oral BID  . Chlorhexidine Gluconate Cloth  6 each Topical Q0600  . [START ON 04/05/2019] epoetin (EPOGEN/PROCRIT) injection  10,000 Units Intravenous Q T,Th,Sa-HD  . heparin  5,000 Units Subcutaneous Q8H  . [START ON 04/05/2019] levofloxacin  500 mg Oral Q48H  . LORazepam  1 mg Oral BID  . metoprolol tartrate  50 mg Oral BID  . pantoprazole  40 mg Oral Daily  . torsemide  100 mg Oral BID     Assessment/ Plan:  [name] is a 68 y.o. white female with end stage renal disease on hemodialysis, hypertension, anxiety, GERD, who is admitted to [facility] on 04/03/2019 for End stage renal disease on  dialysis [facility]) [N18.6, Z99.2] Atrial flutter, unspecified type [facility]) [I48.92] Patient with new onset atrial fibrillation.   [facility] TTS [facility] RIJ permcath 51kg  1. End Stage Renal Disease: Hemodialysis treatment yesterday Next treatment for tomorrow.   2. Hypertension: with new onset atrial fibrillation Home regimen of carvedilol, clonidine, amlodipine, hydralazine, quinapril and torsemide.  Placed on diltiazem this admission - Appreciate cardiology input - Changed from carvedilol to metoprolol.   3. Secondary Hyperparathyroidism - sevelamer with meals  4. Anemia of chronic kidney disease: Hemoglobin 6.9 - EPO with HD treatment.  - PRBC transfusion scheduled.    LOS: 1 Saivon Prowse 12/2/202012:58 PM

## 2019-04-04 NOTE — Progress Notes (Signed)
*  PRELIMINARY RESULTS* Echocardiogram 2D Echocardiogram has been performed.  Amy [name] 04/04/2019, 9:24 AM

## 2019-04-05 DIAGNOSIS — Z992 Dependence on renal dialysis: Secondary | ICD-10-CM

## 2019-04-05 DIAGNOSIS — D631 Anemia in chronic kidney disease: Secondary | ICD-10-CM

## 2019-04-05 LAB — CBC
HCT: 24.8 % — ABNORMAL LOW (ref 36.0–46.0)
Hemoglobin: 8.2 g/dL — ABNORMAL LOW (ref 12.0–15.0)
MCH: 29.3 pg (ref 26.0–34.0)
MCHC: 33.1 g/dL (ref 30.0–36.0)
MCV: 88.6 fL (ref 80.0–100.0)
Platelets: 259 10*3/uL (ref 150–400)
RBC: 2.8 MIL/uL — ABNORMAL LOW (ref 3.87–5.11)
RDW: 16.1 % — ABNORMAL HIGH (ref 11.5–15.5)
WBC: 11.1 10*3/uL — ABNORMAL HIGH (ref 4.0–10.5)
nRBC: 0 % (ref 0.0–0.2)

## 2019-04-05 LAB — URINE CULTURE: Culture: NO GROWTH

## 2019-04-05 LAB — TYPE AND SCREEN
ABO/RH(D): O POS
Antibody Screen: NEGATIVE
Unit division: 0

## 2019-04-05 LAB — BASIC METABOLIC PANEL
Anion gap: 13 (ref 5–15)
BUN: 39 mg/dL — ABNORMAL HIGH (ref 8–23)
CO2: 25 mmol/L (ref 22–32)
Calcium: 8.9 mg/dL (ref 8.9–10.3)
Chloride: 98 mmol/L (ref 98–111)
Creatinine, Ser: 4.05 mg/dL — ABNORMAL HIGH (ref 0.44–1.00)
GFR calc Af Amer: 12 mL/min — ABNORMAL LOW (ref >60)
GFR calc non Af Amer: 11 mL/min — ABNORMAL LOW (ref >60)
Glucose, Bld: 111 mg/dL — ABNORMAL HIGH (ref 70–99)
Potassium: 3.7 mmol/L (ref 3.5–5.1)
Sodium: 136 mmol/L (ref 135–145)

## 2019-04-05 LAB — BPAM RBC
Blood Product Expiration Date: 202012302359
ISSUE DATE / TIME: 202012021623
Unit Type and Rh: 5100

## 2019-04-05 LAB — GLUCOSE, CAPILLARY: Glucose-Capillary: 101 mg/dL — ABNORMAL HIGH (ref 70–99)

## 2019-04-05 MED ORDER — APIXABAN 2.5 MG PO TABS
2.5000 mg | ORAL_TABLET | Freq: Two times a day (BID) | ORAL | Status: DC
  Administered 2019-04-05 – 2019-04-06 (×3): 2.5 mg via ORAL
  Filled 2019-04-05 (×3): qty 1

## 2019-04-05 MED ORDER — BENZONATATE 100 MG PO CAPS
100.0000 mg | ORAL_CAPSULE | Freq: Two times a day (BID) | ORAL | Status: DC | PRN
  Administered 2019-04-05 (×2): 100 mg via ORAL
  Filled 2019-04-05 (×2): qty 1

## 2019-04-05 MED ORDER — METOPROLOL TARTRATE 25 MG PO TABS
75.0000 mg | ORAL_TABLET | Freq: Two times a day (BID) | ORAL | Status: DC
  Administered 2019-04-05 – 2019-04-06 (×3): 75 mg via ORAL
  Filled 2019-04-05 (×3): qty 3

## 2019-04-05 NOTE — Progress Notes (Signed)
Brief Hx:  Amy [name] is a 68 y.o. female with medical history significant of ESRD on hemodialysis TTS, HTN, anemia of chronic disease, GERD sent to the hospital from dialysis center for evaluation of fever and atrial fibrillation/RVR.  Patient has been having cough/URI type symptoms for the past 5 days.  She went to her last dialysis session on Saturday where she was tested for COVID-19 which was negative.  At home she is often not had some rhinorrhea, subjective fevers and mild productive cough.  Today she went for her dialysis and prior to starting it she was noted to be tachycardic with low-grade fever.  EMS was called to bring her to the hospital. In the ER she was noted to be in atrial flutter/fibrillation with RVR.  Saturating greater than 95% on room air.  Blood pressure was stable.  She did have low-grade temperature and mild cough.  Chest x-ray showed bilateral small pleural effusion with cardiomegaly.  COVID-19 antigen was negative, PCR was ordered.  Medical team was requested to admit the patient and nephrology team was consulted.  Cardizem drip was also started.  Subjective: Still having some dry cough.  Status post hemodialysis this morning.  Had a run of A. fib this morning as well.  Patient denies any other issues there was no fever.  Objective: Vital signs in last 24 hours: Temp:  [98.9 F (37.2 C)-100.7 F (38.2 C)] 99.1 F (37.3 C) (12/03 1240) Pulse Rate:  [79-100] 99 (12/03 1240) Resp:  [13-26] 17 (12/03 1230) BP: (131-178)/(63-119) 163/84 (12/03 1230) SpO2:  [88 %-99 %] 93 % (12/03 0935) Weight:  [54.3 kg] 54.3 kg (12/03 0500)  Intake/Output from previous day: 12/02 0701 - 12/03 0700 In: 720 [P.O.:360] Out: 0  Intake/Output this shift: Total I/O In: -  Out: 1500 [Other:1500]  Constitutional: NAD, calm, comfortable Eyes: PERRL, lids and conjunctivae normal ENMT: Mucous membranes are moist. Posterior pharynx clear of any exudate or lesions.Normal dentition.   Neck: normal, supple, no masses, no thyromegaly Respiratory: Mild bibasilar rhonchi Cardiovascular:  Regular rate and rhythm; no murmurs / rubs / gallops. No extremity edema. 2+ pedal pulses. No carotid bruits.  Abdomen: no tenderness, no masses palpated. No hepatosplenomegaly. Bowel sounds positive.  Musculoskeletal: no clubbing / cyanosis. No joint deformity upper and lower extremities. Good ROM, no contractures. Normal muscle tone.  Skin: no rashes, lesions, ulcers. No induration Neurologic: CN 2-12 grossly intact. Sensation intact, DTR normal. Strength 5/5 in all 4.  Psychiatric: Normal judgment and insight. Alert and oriented x 3. Normal mood.   Results for orders placed or performed during the hospital encounter of 04/03/19 (from the past 24 hour(s))  Prepare RBC     Status: None   Collection Time: 04/04/19  3:53 PM  Result Value Ref Range   Order Confirmation      ORDER PROCESSED BY BLOOD BANK Performed at [facility], [address] [place], [place] [address]   CBC     Status: Abnormal   Collection Time: 04/05/19  5:20 AM  Result Value Ref Range   WBC 11.1 (H) 4.0 - 10.5 K/uL   RBC 2.80 (L) 3.87 - 5.11 MIL/uL   Hemoglobin 8.2 (L) 12.0 - 15.0 g/dL   HCT 24.8 (L) 36.0 - 46.0 %   MCV 88.6 80.0 - 100.0 fL   MCH 29.3 26.0 - 34.0 pg   MCHC 33.1 30.0 - 36.0 g/dL   RDW 16.1 (H) 11.5 - 15.5 %   Platelets 259 150 -  400 K/uL   nRBC 0.0 0.0 - 0.2 %  Basic metabolic panel     Status: Abnormal   Collection Time: 04/05/19  5:20 AM  Result Value Ref Range   Sodium 136 135 - 145 mmol/L   Potassium 3.7 3.5 - 5.1 mmol/L   Chloride 98 98 - 111 mmol/L   CO2 25 22 - 32 mmol/L   Glucose, Bld 111 (H) 70 - 99 mg/dL   BUN 39 (H) 8 - 23 mg/dL   Creatinine, Ser 4.05 (H) 0.44 - 1.00 mg/dL   Calcium 8.9 8.9 - 10.3 mg/dL   GFR calc non Af Amer 11 (L) >60 mL/min   GFR calc Af Amer 12 (L) >60 mL/min   Anion gap 13 5 - 15  Glucose, capillary     Status: Abnormal   Collection Time:  04/05/19  7:49 AM  Result Value Ref Range   Glucose-Capillary 101 (H) 70 - 99 mg/dL    Studies/Results: Dg Chest 2 View  Result Date: 04/03/2019 CLINICAL DATA:  Chest pain and cough. EXAM: CHEST - 2 VIEW COMPARISON:  12/28/2016 FINDINGS: There is new mild cardiomegaly as well as new small bilateral pleural effusions. Pulmonary vascularity is normal. No infiltrates. Minimal linear atelectasis at the lung bases. Double lumen central venous dialysis catheter appears in good position with the tips at the right atrium. Bones are normal. IMPRESSION: 1. New small bilateral pleural effusions. 2. New mild cardiomegaly. 3. Central venous catheter in good position. Electronically Signed   By: Lorriane Shire M.D.   On: 04/03/2019 12:44    Scheduled Meds: . sodium chloride   Intravenous Once  . apixaban  2.5 mg Oral BID  . buPROPion  75 mg Oral BID  . Chlorhexidine Gluconate Cloth  6 each Topical Q0600  . epoetin (EPOGEN/PROCRIT) injection  10,000 Units Intravenous Q T,Th,Sa-HD  . heparin  5,000 Units Subcutaneous Q8H  . levofloxacin  500 mg Oral Q48H  . LORazepam  1 mg Oral BID  . metoprolol tartrate  75 mg Oral BID  . pantoprazole  40 mg Oral Daily  . torsemide  100 mg Oral BID   Continuous Infusions:  PRN Meds:acetaminophen **OR** acetaminophen, benzonatate, bisacodyl, HYDROcodone-acetaminophen, ondansetron **OR** ondansetron (ZOFRAN) IV, senna-docusate, zolpidem  Assessment/Plan: Principal Problem:   Fever Active Problems:   ESRD (end stage renal disease) (HCC)   Atrial fibrillation with RVR (HCC)   Benign essential HTN   HLD (hyperlipidemia)    Atrial flutter/fibrillation with RVR on admission -Converted to normal sinus rhythm  -Intermittently briefly runs as did this morning -Status post  IV Cardizem drip   -Started patient and changed Lopressor 75 mg twice daily as per cardiology and titrate for heart rate control  -Patient chads 2 BASC score of 3 with left atrium being mildly  dilated, patient has been started on Eliquis 2.5 mg p.o. daily as per cardiology -Cardiology Consider 2-week monitor as outpatient, could be contributing to pulmonary edema and small pleural effusions on exam Appreciate cardiology recommendation  -Continue to monitor electrolytes, check TSH.   - Echocardiogram report pending -Continue to monitor in telemetry  Cough/fevers/upper respiratory congestion -Much improved but is still present intermittently  nonspecific URI.  - COVID-19  Antigen-negative.  COVID-19 PCR negative again on 04/03/2019 -Supportive care  -Chest x-ray shows mild bilateral pleural effusion and cardiomegaly -May improved following hemodialysis which is to be received on Tuesday Thursday and Saturday -As needed [employer] and incentive spirometry -Empiric dose of Levaquin given in the  ER and will not continue it anymore at this point  ESRD on hemodialysis TTS -  Nephrology notified.  Patient receives hemodialysis on Tuesday Thursday and Saturday  Essential hypertension -Stable -  Will resume her home medications including Norvasc, clonidine when when needed  Anemia: Appears to be anemia of chronic diseases Hemoglobin found to be 6.9 this morning compared to 7.1 on admission We will transfuse 1 unit of PRBC on 04/04/2019 H&H above 8-day after transfusion Patient may need Epogen shot as per nephrology  GERD -PPI  DVT prophylaxis: Subcutaneous heparin Code Status: Full code Family Communication: None at bedside Disposition Plan: To be determined Consults called: Nephrology   LOS: 2 days   Amy [name] Amy [name]

## 2019-04-05 NOTE — Progress Notes (Signed)
Progress Note  Patient Name: Amy [name] Date of Encounter: 04/05/2019  Primary Cardiologist: New to [facility] - consult by End  Subjective   No chest pain, palpitations, or SOB. Maintaining sinus rhythm. Currently afebrile. COVID PCR still pending. Received 1 unit pRBC 12/2 with HGB 8.2 this morning.   Inpatient Medications    Scheduled Meds: . sodium chloride   Intravenous Once  . buPROPion  75 mg Oral BID  . Chlorhexidine Gluconate Cloth  6 each Topical Q0600  . epoetin (EPOGEN/PROCRIT) injection  10,000 Units Intravenous Q T,Th,Sa-HD  . heparin  5,000 Units Subcutaneous Q8H  . levofloxacin  500 mg Oral Q48H  . LORazepam  1 mg Oral BID  . metoprolol tartrate  50 mg Oral BID  . pantoprazole  40 mg Oral Daily  . torsemide  100 mg Oral BID   Continuous Infusions: . diltiazem (CARDIZEM) infusion Stopped (04/03/19 1826)   PRN Meds: acetaminophen **OR** acetaminophen, bisacodyl, HYDROcodone-acetaminophen, ondansetron **OR** ondansetron (ZOFRAN) IV, senna-docusate, zolpidem   Vital Signs    Vitals:   04/05/19 0300 04/05/19 0400 04/05/19 0500 04/05/19 0600  BP: (!) 170/76 (!) 160/73 (!) 172/82 (!) 166/72  Pulse: 81 79 81 80  Resp: 13 18 17 18   Temp:  98.9 F (37.2 C)    TempSrc:  Oral    SpO2: 99% 98% 98% 95%  Weight:   54.3 kg   Height:        Intake/Output Summary (Last 24 hours) at 04/05/2019 0725 Last data filed at 04/04/2019 1930 Gross per 24 hour  Intake 720 ml  Output 0 ml  Net 720 ml   Filed Weights   04/03/19 1225 04/04/19 0200 04/05/19 0500  Weight: 54.4 kg 52.5 kg 54.3 kg    Telemetry    SR, 80s to 90s bpm, rare PAC/PVC - Personally Reviewed  ECG    No new tracings - Personally Reviewed  Physical Exam   GEN: No acute distress.   Neck: No JVD. Cardiac: RRR, no murmurs, rubs, or gallops.  Respiratory: Clear to auscultation bilaterally.  GI: Soft, nontender, non-distended.   MS: No edema; No deformity. Neuro:  Alert and oriented x 3;  Nonfocal.  Psych: Normal affect.  Labs    Chemistry Recent Labs  Lab 04/03/19 1255 04/03/19 1718 04/04/19 0503 04/05/19 0520  NA 132*  --  136 136  K 3.8  --  3.5 3.7  CL 95*  --  98 98  CO2 20*  --  25 25  GLUCOSE 124*  --  112* 111*  BUN 67*  --  25* 39*  CREATININE 5.87* 5.81* 2.82* 4.05*  CALCIUM 9.0  --  8.7* 8.9  PROT  --   --  5.9*  --   ALBUMIN  --   --  3.0*  --   AST  --   --  20  --   ALT  --   --  22  --   ALKPHOS  --   --  134*  --   BILITOT  --   --  0.8  --   GFRNONAA 7* 7* 16* 11*  GFRAA 8* 8* 19* 12*  ANIONGAP 17*  --  13 13     Hematology Recent Labs  Lab 04/03/19 1718 04/04/19 0503 04/05/19 0520  WBC 16.0* 9.6 11.1*  RBC 2.42* 2.39* 2.80*  HGB 7.1* 6.9* 8.2*  HCT 20.9* 20.5* 24.8*  MCV 86.4 85.8 88.6  MCH 29.3 28.9 29.3  MCHC  34.0 33.7 33.1  RDW 16.3* 16.5* 16.1*  PLT 271 233 259    Cardiac EnzymesNo results for input(s): TROPONINI in the last 168 hours. No results for input(s): TROPIPOC in the last 168 hours.   BNP Recent Labs  Lab 04/03/19 1255  BNP 536.0*     DDimer No results for input(s): DDIMER in the last 168 hours.   Radiology    Dg Chest 2 View  Result Date: 04/03/2019 IMPRESSION: 1. New small bilateral pleural effusions. 2. New mild cardiomegaly. 3. Central venous catheter in good position. Electronically Signed   By: Lorriane Shire M.D.   On: 04/03/2019 12:44    Cardiac Studies   2D Echo 04/04/2019: 1. Left ventricular ejection fraction, by visual estimation, is 65 to 70%. The left ventricle has normal function. There is borderline left ventricular hypertrophy.  2. Left ventricular diastolic parameters are consistent with Grade II diastolic dysfunction (pseudonormalization).  3. Global right ventricle has normal systolic function.The right ventricular size is not assessed. No increase in right ventricular wall thickness.  4. Left atrial size was mildly dilated.  5. Right atrial size was normal.  6. Small  pericardial effusion.  7. The pericardial effusion is circumferential.  8. The mitral valve is normal in structure. No evidence of mitral valve regurgitation.  9. The tricuspid valve is normal in structure. Tricuspid valve regurgitation is not demonstrated. 10. The aortic valve is normal in structure. Aortic valve regurgitation is not visualized. 11. The pulmonic valve was not well visualized. Pulmonic valve regurgitation is not visualized. 12. Normal pulmonary artery systolic pressure. 13. The inferior vena cava is dilated in size with >50% respiratory variability, suggesting right atrial pressure of 8 mmHg.  Patient Profile     68 y.o. female with history of ESRD on HD, HTN, anemia of chronic disease, and GERD who we are seeing for evaluation of atrial flutter in the setting of febrile illness.  Assessment & Plan    1. Atrial flutter with RVR: -In the ED, patient initially noted to have typical atrial flutter with variable AV block with subsequent spontaneous conversion to sinus tachycardia with PACs and brief atrial runs felt to be in the setting of the patient's acute febrile illness -She is maintaining sinus rhythm with heart rates in the 80s tp 90s bpm  -BP soft in the low 466Z systolic on 99/3 leading her Coreg to be changed to Lopressor -Titrate Lopressor to 75 mg twice daily with titration as indicated -Off diltiazem drip  -Anticoagulation has been deferred at this time in the setting of patient's history of anemia chronic disease and likely brief.  Of atrial flutter in the setting of acute illness -CHADS2VASc of 3 (HTN, age x1, sex category) -Recommend outpatient cardiac monitoring in follow-up to evaluate for atrial flutter burden -If she is found to have recurrent episodes would need to revisit full dose anticoagulation at that time -Follow-up echo when completed  2.  HTN: -Titrate Lopressor as above  3.  ESRD on HD TTS: -HD per nephrology  4.  Febrile illness: -T-max   101.1, improved morning of 12/3 -Rapid COVID-19 negative -COVID PCR remains pending morning of 12/3 -Cultures pending -Supportive care per internal medicine  5.  Anemia of chronic disease: -Status post 1 unit pRBC 04/04/2019 -Per IM/nephrology  6. Pleural effusions/diastolic dysfunction: -Volume managed by HD -Remains on torsemide per nephrology   For questions or updates, please contact [place] Please consult [email or web address] for contact info under Cardiology/STEMI.    Signed,  Christell Faith, PA-C [place] Pager: [phone] 04/05/2019, 7:25 AM

## 2019-04-05 NOTE — Progress Notes (Signed)
This note also relates to the following rows which could not be included: Resp - Cannot attach notes to unvalidated device data BP - Cannot attach notes to unvalidated device data  Hd started  

## 2019-04-05 NOTE — Progress Notes (Signed)
Established hemodialysis patient known at [place] 11:30, patient drives self to treatments. Please contact me with any dialysis placement concerns.  Elvera Bicker Dialysis  Coordinator [phone]

## 2019-04-05 NOTE — Progress Notes (Signed)
[facility]  ROUNDING NOTE   Subjective:   PRBC transfusion yesterday.   Seen and examined on hemodialysis treatment. Tolerating treatment well. UF goal of 2 liters.    HEMODIALYSIS FLOWSHEET:  Blood Flow Rate (mL/min): 400 mL/min Arterial Pressure (mmHg): -150 mmHg Venous Pressure (mmHg): 150 mmHg Transmembrane Pressure (mmHg): 50 mmHg Ultrafiltration Rate (mL/min): 570 mL/min Dialysate Flow Rate (mL/min): 600 ml/min Conductivity: Machine : 14 Conductivity: Machine : 14 Dialysis Fluid Bolus: Normal Saline Bolus Amount (mL): 250 mL    Objective:  Vital signs in last 24 hours:  Temp:  [98.9 F (37.2 C)-100.7 F (38.2 C)] 99.1 F (37.3 C) (12/03 0935) Pulse Rate:  [79-100] 98 (12/03 1230) Resp:  [13-26] 17 (12/03 1230) BP: (127-178)/(57-119) 163/84 (12/03 1230) SpO2:  [88 %-99 %] 93 % (12/03 0935) Weight:  [54.3 kg] 54.3 kg (12/03 0500)  Weight change: -0.132 kg Filed Weights   04/03/19 1225 04/04/19 0200 04/05/19 0500  Weight: 54.4 kg 52.5 kg 54.3 kg    Intake/Output: I/O last 3 completed shifts: In: 720 [P.O.:360; Blood:360] Out: 559 [Other:559]   Intake/Output this shift:  No intake/output data recorded.  Physical Exam: General: Laying in bed  Head: Normocephalic, atraumatic. Moist oral mucosal membranes  Eyes: Anicteric, PERRL  Neck: Supple, trachea midline  Lungs:  Rhonchi bilaterally  Heart: regular  Abdomen:  Soft, nontender,   Extremities:  no peripheral edema.  Neurologic: Nonfocal, moving all four extremities  Skin: No lesions  Access: RIJ permcath    Basic Metabolic Panel: Recent Labs  Lab 04/03/19 1255 04/03/19 1718 04/04/19 0503 04/05/19 0520  NA 132*  --  136 136  K 3.8  --  3.5 3.7  CL 95*  --  98 98  CO2 20*  --  25 25  GLUCOSE 124*  --  112* 111*  BUN 67*  --  25* 39*  CREATININE 5.87* 5.81* 2.82* 4.05*  CALCIUM 9.0  --  8.7* 8.9    Liver Function Tests: Recent Labs  Lab 04/04/19 0503  AST 20  ALT 22   ALKPHOS 134*  BILITOT 0.8  PROT 5.9*  ALBUMIN 3.0*   No results for input(s): LIPASE, AMYLASE in the last 168 hours. No results for input(s): AMMONIA in the last 168 hours.  CBC: Recent Labs  Lab 04/03/19 1255 04/03/19 1718 04/04/19 0503 04/05/19 0520  WBC 17.9* 16.0* 9.6 11.1*  HGB 8.1* 7.1* 6.9* 8.2*  HCT 25.0* 20.9* 20.5* 24.8*  MCV 89.6 86.4 85.8 88.6  PLT 321 271 233 259    Cardiac Enzymes: No results for input(s): CKTOTAL, CKMB, CKMBINDEX, TROPONINI in the last 168 hours.  BNP: Invalid input(s): POCBNP  CBG: Recent Labs  Lab 04/04/19 0202 04/04/19 0755 04/05/19 0749  GLUCAP 109* 109* 101*    Microbiology: Results for orders placed or performed during the hospital encounter of 04/03/19  Blood culture (routine x 2)     Status: None (Preliminary result)   Collection Time: 04/03/19  1:38 PM   Specimen: BLOOD  Result Value Ref Range Status   Specimen Description BLOOD LEFT ANTECUBITAL  Final   Special Requests   Final    BOTTLES DRAWN AEROBIC AND ANAEROBIC Blood Culture adequate volume   Culture   Final    NO GROWTH 2 DAYS Performed at [facility], [address] [place], [place] [address]    Report Status PENDING  Incomplete  Blood culture (routine x 2)     Status: None (Preliminary result)   Collection  Time: 04/03/19  1:38 PM   Specimen: BLOOD  Result Value Ref Range Status   Specimen Description BLOOD RIGHT ANTECUBITAL  Final   Special Requests   Final    BOTTLES DRAWN AEROBIC AND ANAEROBIC Blood Culture adequate volume   Culture   Final    NO GROWTH 2 DAYS Performed at [facility], [address] [place], [place] [address]    Report Status PENDING  Incomplete  Urine Culture     Status: None   Collection Time: 04/03/19  1:56 PM   Specimen: Urine, Random  Result Value Ref Range Status   Specimen Description   Final    URINE, RANDOM Performed at [facility], [address], [place], [place] [address]     Special Requests   Final    NONE Performed at [facility], [address], [place], [place] [address]    Culture   Final    NO GROWTH Performed at [facility], [place] [address] [place], [place] [address]    Report Status 04/05/2019 FINAL  Final  SARS CORONAVIRUS 2 (TAT 6-24 HRS) Nasopharyngeal Nasopharyngeal Swab     Status: None   Collection Time: 04/03/19  3:33 PM   Specimen: Nasopharyngeal Swab  Result Value Ref Range Status   SARS Coronavirus 2 NEGATIVE NEGATIVE Final    Comment: (NOTE) SARS-CoV-2 target nucleic acids are NOT DETECTED. The SARS-CoV-2 RNA is generally detectable in upper and lower respiratory specimens during the acute phase of infection. Negative results do not preclude SARS-CoV-2 infection, do not rule out co-infections with other pathogens, and should not be used as the sole basis for treatment or other patient management decisions. Negative results must be combined with clinical observations, patient history, and epidemiological information. The expected result is Negative. Fact Sheet for Patients: SugarRoll.be Fact Sheet for Healthcare Providers: https://www.woods-mathews.com/ This test is not yet approved or cleared by the [place] FDA and  has been authorized for detection and/or diagnosis of SARS-CoV-2 by FDA under an Emergency Use Authorization (EUA). This EUA will remain  in effect (meaning this test can be used) for the duration of the COVID-19 declaration under Section 56 4(b)(1) of the Act, 21 U.S.C. section 360bbb-3(b)(1), unless the authorization is terminated or revoked sooner. Performed at [facility], [place] [address] [place], [place] [address]   MRSA PCR Screening     Status: None   Collection Time: 04/04/19  2:10 AM   Specimen: Nasal Mucosa; Nasopharyngeal  Result Value Ref Range Status   MRSA by PCR NEGATIVE NEGATIVE Final    Comment:        The GeneXpert MRSA  Assay (FDA approved for NASAL specimens only), is one component of a comprehensive MRSA colonization surveillance program. It is not intended to diagnose MRSA infection nor to guide or monitor treatment for MRSA infections. Performed at [facility], [address] [place], [place] [address]     Coagulation Studies: Recent Labs    04/03/19 1338 04/04/19 0503  LABPROT 13.0 15.1  INR 1.0 1.2    Urinalysis: Recent Labs    04/03/19 1356  COLORURINE YELLOW*  LABSPEC 1.017  PHURINE 5.0  GLUCOSEU NEGATIVE  HGBUR NEGATIVE  BILIRUBINUR NEGATIVE  KETONESUR NEGATIVE  PROTEINUR 100*  NITRITE NEGATIVE  LEUKOCYTESUR NEGATIVE      Imaging: Dg Chest 2 View  Result Date: 04/03/2019 CLINICAL DATA:  Chest pain and cough. EXAM: CHEST - 2 VIEW COMPARISON:  12/28/2016 FINDINGS: There is  new mild cardiomegaly as well as new small bilateral pleural effusions. Pulmonary vascularity is normal. No infiltrates. Minimal linear atelectasis at the lung bases. Double lumen central venous dialysis catheter appears in good position with the tips at the right atrium. Bones are normal. IMPRESSION: 1. New small bilateral pleural effusions. 2. New mild cardiomegaly. 3. Central venous catheter in good position. Electronically Signed   By: Lorriane Shire M.D.   On: 04/03/2019 12:44     Medications:   . diltiazem (CARDIZEM) infusion Stopped (04/03/19 1826)   . sodium chloride   Intravenous Once  . apixaban  2.5 mg Oral BID  . buPROPion  75 mg Oral BID  . Chlorhexidine Gluconate Cloth  6 each Topical Q0600  . epoetin (EPOGEN/PROCRIT) injection  10,000 Units Intravenous Q T,Th,Sa-HD  . heparin  5,000 Units Subcutaneous Q8H  . levofloxacin  500 mg Oral Q48H  . LORazepam  1 mg Oral BID  . metoprolol tartrate  75 mg Oral BID  . pantoprazole  40 mg Oral Daily  . torsemide  100 mg Oral BID     Assessment/ Plan:  Amy [name] is a 68 y.o. white female with end stage renal disease on  hemodialysis, hypertension, anxiety, GERD, who is admitted to [facility] on 04/03/2019 for End stage renal disease on dialysis [facility]) [N18.6, Z99.2] Atrial flutter, unspecified type [facility]) [I48.92] Patient with new onset atrial fibrillation.   [facility] TTS [facility] RIJ permcath 51kg  1. End Stage Renal Disease: seen and examined on hemodialysis treatment. Tolerating treatment well. Continue TTS schedule.   2. Hypertension: with new onset atrial fibrillation Home regimen of carvedilol, clonidine, amlodipine, hydralazine, quinapril and torsemide.  Placed on diltiazem this admission - Appreciate cardiology input - Changed from carvedilol to metoprolol. - increased metoprolol to 75mg  today  3. Secondary Hyperparathyroidism - sevelamer with meals  4. Anemia of chronic kidney disease:status post PRBC transfusion on 12/2 - EPO with HD treatment.     LOS: 2 Breanne Olvera 12/3/202012:36 PM

## 2019-04-05 NOTE — Progress Notes (Signed)
This note also relates to the following rows which could not be included: Resp - Cannot attach notes to unvalidated device data BP - Cannot attach notes to unvalidated device data  Hd completed  

## 2019-04-06 DIAGNOSIS — J069 Acute upper respiratory infection, unspecified: Principal | ICD-10-CM

## 2019-04-06 LAB — CBC WITH DIFFERENTIAL/PLATELET
Abs Immature Granulocytes: 0.06 10*3/uL (ref 0.00–0.07)
Basophils Absolute: 0.1 10*3/uL (ref 0.0–0.1)
Basophils Relative: 0 %
Eosinophils Absolute: 3 10*3/uL — ABNORMAL HIGH (ref 0.0–0.5)
Eosinophils Relative: 26 %
HCT: 25.3 % — ABNORMAL LOW (ref 36.0–46.0)
Hemoglobin: 8.6 g/dL — ABNORMAL LOW (ref 12.0–15.0)
Immature Granulocytes: 1 %
Lymphocytes Relative: 8 %
Lymphs Abs: 1 10*3/uL (ref 0.7–4.0)
MCH: 29.6 pg (ref 26.0–34.0)
MCHC: 34 g/dL (ref 30.0–36.0)
MCV: 86.9 fL (ref 80.0–100.0)
Monocytes Absolute: 1.3 10*3/uL — ABNORMAL HIGH (ref 0.1–1.0)
Monocytes Relative: 11 %
Neutro Abs: 6.2 10*3/uL (ref 1.7–7.7)
Neutrophils Relative %: 54 %
Platelets: 283 10*3/uL (ref 150–400)
RBC: 2.91 MIL/uL — ABNORMAL LOW (ref 3.87–5.11)
RDW: 16.8 % — ABNORMAL HIGH (ref 11.5–15.5)
Smear Review: NORMAL
WBC: 11.5 10*3/uL — ABNORMAL HIGH (ref 4.0–10.5)
nRBC: 0 % (ref 0.0–0.2)

## 2019-04-06 LAB — BASIC METABOLIC PANEL
Anion gap: 9 (ref 5–15)
BUN: 22 mg/dL (ref 8–23)
CO2: 29 mmol/L (ref 22–32)
Calcium: 8.6 mg/dL — ABNORMAL LOW (ref 8.9–10.3)
Chloride: 98 mmol/L (ref 98–111)
Creatinine, Ser: 2.8 mg/dL — ABNORMAL HIGH (ref 0.44–1.00)
GFR calc Af Amer: 19 mL/min — ABNORMAL LOW (ref >60)
GFR calc non Af Amer: 16 mL/min — ABNORMAL LOW (ref >60)
Glucose, Bld: 108 mg/dL — ABNORMAL HIGH (ref 70–99)
Potassium: 3.3 mmol/L — ABNORMAL LOW (ref 3.5–5.1)
Sodium: 136 mmol/L (ref 135–145)

## 2019-04-06 LAB — PATHOLOGIST SMEAR REVIEW

## 2019-04-06 LAB — HEMOGLOBIN AND HEMATOCRIT, BLOOD
HCT: 25 % — ABNORMAL LOW (ref 36.0–46.0)
Hemoglobin: 8 g/dL — ABNORMAL LOW (ref 12.0–15.0)

## 2019-04-06 LAB — GLUCOSE, CAPILLARY: Glucose-Capillary: 98 mg/dL (ref 70–99)

## 2019-04-06 LAB — MAGNESIUM: Magnesium: 1.5 mg/dL — ABNORMAL LOW (ref 1.7–2.4)

## 2019-04-06 MED ORDER — METOPROLOL TARTRATE 75 MG PO TABS: 75.0000 mg | ORAL_TABLET | Freq: Two times a day (BID) | ORAL | 0 refills | Status: DC

## 2019-04-06 MED ORDER — BISACODYL 5 MG PO TBEC: 5.0000 mg | DELAYED_RELEASE_TABLET | Freq: Every day | ORAL | 0 refills | Status: DC | PRN

## 2019-04-06 MED ORDER — BENZONATATE 100 MG PO CAPS: 100.0000 mg | ORAL_CAPSULE | Freq: Two times a day (BID) | ORAL | 0 refills | Status: DC | PRN

## 2019-04-06 MED ORDER — POTASSIUM CHLORIDE CRYS ER 20 MEQ PO TBCR
40.0000 meq | EXTENDED_RELEASE_TABLET | Freq: Once | ORAL | Status: AC
  Administered 2019-04-06: 11:00:00 40 meq via ORAL
  Filled 2019-04-06: qty 2

## 2019-04-06 MED ORDER — APIXABAN 2.5 MG PO TABS: 2.5000 mg | ORAL_TABLET | Freq: Two times a day (BID) | ORAL | 0 refills | Status: DC

## 2019-04-06 NOTE — Progress Notes (Signed)
Discharge teaching done. Questions answered. D/C home with daughter at 87.

## 2019-04-06 NOTE — Progress Notes (Signed)
[facility]  ROUNDING NOTE   Subjective:   Hemodialysis treatment yesterday. Tolerated treatment well. UF of 1536mL  Continues to have cough.   Objective:  Vital signs in last 24 hours:  Temp:  [98.5 F (36.9 C)-99.7 F (37.6 C)] 98.5 F (36.9 C) (12/04 0400) Pulse Rate:  [77-100] 90 (12/04 1032) Resp:  [12-24] 19 (12/04 0600) BP: (137-171)/(69-119) 154/82 (12/04 1032) SpO2:  [90 %-98 %] 92 % (12/04 0600)  Weight change:  Filed Weights   04/03/19 1225 04/04/19 0200 04/05/19 0500  Weight: 54.4 kg 52.5 kg 54.3 kg    Intake/Output: I/O last 3 completed shifts: In: 760 [P.O.:400; Blood:360] Out: 1500 [Other:1500]   Intake/Output this shift:  Total I/O In: 240 [P.O.:240] Out: -   Physical Exam: General: Laying in bed  Head: Normocephalic, atraumatic. Moist oral mucosal membranes  Eyes: Anicteric, PERRL  Neck: Supple, trachea midline  Lungs:  clear  Heart: regular  Abdomen:  Soft, nontender,   Extremities:  no peripheral edema.  Neurologic: Nonfocal, moving all four extremities  Skin: No lesions  Access: RIJ permcath    Basic Metabolic Panel: Recent Labs  Lab 04/03/19 1255 04/03/19 1718 04/04/19 0503 04/05/19 0520 04/06/19 0409  NA 132*  --  136 136 136  K 3.8  --  3.5 3.7 3.3*  CL 95*  --  98 98 98  CO2 20*  --  25 25 29   GLUCOSE 124*  --  112* 111* 108*  BUN 67*  --  25* 39* 22  CREATININE 5.87* 5.81* 2.82* 4.05* 2.80*  CALCIUM 9.0  --  8.7* 8.9 8.6*    Liver Function Tests: Recent Labs  Lab 04/04/19 0503  AST 20  ALT 22  ALKPHOS 134*  BILITOT 0.8  PROT 5.9*  ALBUMIN 3.0*   No results for input(s): LIPASE, AMYLASE in the last 168 hours. No results for input(s): AMMONIA in the last 168 hours.  CBC: Recent Labs  Lab 04/03/19 1255 04/03/19 1718 04/04/19 0503 04/05/19 0520 04/06/19 0409  WBC 17.9* 16.0* 9.6 11.1*  --   HGB 8.1* 7.1* 6.9* 8.2* 8.0*  HCT 25.0* 20.9* 20.5* 24.8* 25.0*  MCV 89.6 86.4 85.8 88.6  --   PLT  321 271 233 259  --     Cardiac Enzymes: No results for input(s): CKTOTAL, CKMB, CKMBINDEX, TROPONINI in the last 168 hours.  BNP: Invalid input(s): POCBNP  CBG: Recent Labs  Lab 04/04/19 0202 04/04/19 0755 04/05/19 0749 04/06/19 0753  GLUCAP 109* 109* 101* 98    Microbiology: Results for orders placed or performed during the hospital encounter of 04/03/19  Blood culture (routine x 2)     Status: None (Preliminary result)   Collection Time: 04/03/19  1:38 PM   Specimen: BLOOD  Result Value Ref Range Status   Specimen Description BLOOD LEFT ANTECUBITAL  Final   Special Requests   Final    BOTTLES DRAWN AEROBIC AND ANAEROBIC Blood Culture adequate volume   Culture   Final    NO GROWTH 3 DAYS Performed at [facility], [address], [place], [place] [address]    Report Status PENDING  Incomplete  Blood culture (routine x 2)     Status: None (Preliminary result)   Collection Time: 04/03/19  1:38 PM   Specimen: BLOOD  Result Value Ref Range Status   Specimen Description BLOOD RIGHT ANTECUBITAL  Final   Special Requests   Final    BOTTLES DRAWN AEROBIC AND ANAEROBIC Blood Culture adequate  volume   Culture   Final    NO GROWTH 3 DAYS Performed at [facility], [address] [place], [place] [address]    Report Status PENDING  Incomplete  Urine Culture     Status: None   Collection Time: 04/03/19  1:56 PM   Specimen: Urine, Random  Result Value Ref Range Status   Specimen Description   Final    URINE, RANDOM Performed at [facility], [address], [place], [place] [address]    Special Requests   Final    NONE Performed at [facility], [address], [place], [place] [address]    Culture   Final    NO GROWTH Performed at [facility], [place] [address] [place], [place] [address]    Report Status 04/05/2019 FINAL  Final  SARS CORONAVIRUS 2 (TAT 6-24 HRS) Nasopharyngeal Nasopharyngeal Swab     Status: None    Collection Time: 04/03/19  3:33 PM   Specimen: Nasopharyngeal Swab  Result Value Ref Range Status   SARS Coronavirus 2 NEGATIVE NEGATIVE Final    Comment: (NOTE) SARS-CoV-2 target nucleic acids are NOT DETECTED. The SARS-CoV-2 RNA is generally detectable in upper and lower respiratory specimens during the acute phase of infection. Negative results do not preclude SARS-CoV-2 infection, do not rule out co-infections with other pathogens, and should not be used as the sole basis for treatment or other patient management decisions. Negative results must be combined with clinical observations, patient history, and epidemiological information. The expected result is Negative. Fact Sheet for Patients: SugarRoll.be Fact Sheet for Healthcare Providers: https://www.woods-mathews.com/ This test is not yet approved or cleared by the [place] FDA and  has been authorized for detection and/or diagnosis of SARS-CoV-2 by FDA under an Emergency Use Authorization (EUA). This EUA will remain  in effect (meaning this test can be used) for the duration of the COVID-19 declaration under Section 56 4(b)(1) of the Act, 21 U.S.C. section 360bbb-3(b)(1), unless the authorization is terminated or revoked sooner. Performed at [facility], [place] [address] [place], [place] [address]   MRSA PCR Screening     Status: None   Collection Time: 04/04/19  2:10 AM   Specimen: Nasal Mucosa; Nasopharyngeal  Result Value Ref Range Status   MRSA by PCR NEGATIVE NEGATIVE Final    Comment:        The GeneXpert MRSA Assay (FDA approved for NASAL specimens only), is one component of a comprehensive MRSA colonization surveillance program. It is not intended to diagnose MRSA infection nor to guide or monitor treatment for MRSA infections. Performed at [facility], [address] [place], [place] [address]     Coagulation Studies: Recent Labs     04/03/19 1338 04/04/19 0503  LABPROT 13.0 15.1  INR 1.0 1.2    Urinalysis: Recent Labs    04/03/19 1356  COLORURINE YELLOW*  LABSPEC 1.017  PHURINE 5.0  GLUCOSEU NEGATIVE  HGBUR NEGATIVE  BILIRUBINUR NEGATIVE  KETONESUR NEGATIVE  PROTEINUR 100*  NITRITE NEGATIVE  LEUKOCYTESUR NEGATIVE      Imaging: No results found.   Medications:    . sodium chloride   Intravenous Once  . apixaban  2.5 mg Oral BID  . buPROPion  75 mg Oral BID  . Chlorhexidine Gluconate Cloth  6 each Topical Q0600  . epoetin (EPOGEN/PROCRIT) injection  10,000 Units Intravenous Q T,Th,Sa-HD  . LORazepam  1 mg Oral BID  . metoprolol tartrate  75 mg  Oral BID  . pantoprazole  40 mg Oral Daily  . torsemide  100 mg Oral BID     Assessment/ Plan:  Ms. DESANI SPRUNG is a 68 y.o. white female with end stage renal disease on hemodialysis, hypertension, anxiety, GERD, who is admitted to [facility] on 04/03/2019 for End stage renal disease on dialysis [facility]) [N18.6, Z99.2] Atrial flutter, unspecified type [facility]) [I48.92] Patient with new onset atrial fibrillation.   [facility] TTS [facility] RIJ permcath 51kg  1. End Stage Renal Disease:  Continue TTS schedule.   2. Hypertension: with new onset atrial fibrillation Home regimen of carvedilol, clonidine, amlodipine, hydralazine, quinapril and torsemide.  - started on apixaban - Appreciate cardiology input - Changed from carvedilol to metoprolol.   3. Secondary Hyperparathyroidism - sevelamer with meals  4. Anemia of chronic kidney disease:status post PRBC transfusion on 12/2 - EPO with HD treatment.     LOS: 3 Alexia Dinger 12/4/202011:12 AM

## 2019-04-06 NOTE — Progress Notes (Signed)
Progress Note  Patient Name: Amy [name] Date of Encounter: 04/06/2019  Primary Cardiologist: New to [facility] - consult by End  Subjective   Maintaining sinus rhythm. She had a 30-second run of atrial flutter after rounding on the patient on 12/3, and again while MD was rounding. In this setting, she was started on Eliquis 2.5 mg bid given a CHADS2VASc of 3 (HTN, age x 1, sex category). She meets reduced dosing criteria (weight and SCr). Potassium low this morning at 3.3. HGB low, though stable at 8.0.   Notes some left sided chest pain when coughing or taking a deep inspiration. She noted palpitations during recurrent flutter as above, since those two episodes she denies any further palpitations. She does note some BP drops with HD.   Inpatient Medications    Scheduled Meds: . sodium chloride   Intravenous Once  . apixaban  2.5 mg Oral BID  . buPROPion  75 mg Oral BID  . Chlorhexidine Gluconate Cloth  6 each Topical Q0600  . epoetin (EPOGEN/PROCRIT) injection  10,000 Units Intravenous Q T,Th,Sa-HD  . LORazepam  1 mg Oral BID  . metoprolol tartrate  75 mg Oral BID  . pantoprazole  40 mg Oral Daily  . potassium chloride  40 mEq Oral Once  . torsemide  100 mg Oral BID   Continuous Infusions:  PRN Meds: acetaminophen **OR** acetaminophen, benzonatate, bisacodyl, HYDROcodone-acetaminophen, ondansetron **OR** ondansetron (ZOFRAN) IV, senna-docusate, zolpidem   Vital Signs    Vitals:   04/06/19 0300 04/06/19 0400 04/06/19 0500 04/06/19 0600  BP: (!) 158/74 (!) 160/76 (!) 161/81 (!) 158/73  Pulse: 77 80 84 85  Resp: (!) 21 17 (!) 21 19  Temp:  98.5 F (36.9 C)    TempSrc:  Oral    SpO2: 96% 98% 90% 92%  Weight:      Height:        Intake/Output Summary (Last 24 hours) at 04/06/2019 0945 Last data filed at 04/05/2019 1833 Gross per 24 hour  Intake 400 ml  Output 1500 ml  Net -1100 ml   Filed Weights   04/03/19 1225 04/04/19 0200 04/05/19 0500  Weight: 54.4 kg 52.5 kg  54.3 kg    Telemetry    SR with no further flutter - Personally Reviewed  ECG    No new tracings - Personally Reviewed  Physical Exam   GEN: No acute distress.   Neck: No JVD. Cardiac: RRR, no murmurs, rubs, or gallops.  Respiratory: Faint crackles along the bilateral bases.  GI: Soft, nontender, non-distended.   MS: No edema; No deformity. Neuro:  Alert and oriented x 3; Nonfocal.  Psych: Normal affect.  Labs    Chemistry Recent Labs  Lab 04/04/19 0503 04/05/19 0520 04/06/19 0409  NA 136 136 136  K 3.5 3.7 3.3*  CL 98 98 98  CO2 25 25 29   GLUCOSE 112* 111* 108*  BUN 25* 39* 22  CREATININE 2.82* 4.05* 2.80*  CALCIUM 8.7* 8.9 8.6*  PROT 5.9*  --   --   ALBUMIN 3.0*  --   --   AST 20  --   --   ALT 22  --   --   ALKPHOS 134*  --   --   BILITOT 0.8  --   --   GFRNONAA 16* 11* 16*  GFRAA 19* 12* 19*  ANIONGAP 13 13 9      Hematology Recent Labs  Lab 04/03/19 1718 04/04/19 0503 04/05/19 0520 04/06/19 0409  WBC 16.0* 9.6 11.1*  --   RBC 2.42* 2.39* 2.80*  --   HGB 7.1* 6.9* 8.2* 8.0*  HCT 20.9* 20.5* 24.8* 25.0*  MCV 86.4 85.8 88.6  --   MCH 29.3 28.9 29.3  --   MCHC 34.0 33.7 33.1  --   RDW 16.3* 16.5* 16.1*  --   PLT 271 233 259  --     Cardiac EnzymesNo results for input(s): TROPONINI in the last 168 hours. No results for input(s): TROPIPOC in the last 168 hours.   BNP Recent Labs  Lab 04/03/19 1255  BNP 536.0*     DDimer No results for input(s): DDIMER in the last 168 hours.   Radiology    No results found.  Cardiac Studies   2D Echo 04/04/2019: 1. Left ventricular ejection fraction, by visual estimation, is 65 to 70%. The left ventricle has normal function. There is borderline left ventricular hypertrophy. 2. Left ventricular diastolic parameters are consistent with Grade II diastolic dysfunction (pseudonormalization). 3. Global right ventricle has normal systolic function.The right ventricular size is not assessed. No increase in  right ventricular wall thickness. 4. Left atrial size was mildly dilated. 5. Right atrial size was normal. 6. Small pericardial effusion. 7. The pericardial effusion is circumferential. 8. The mitral valve is normal in structure. No evidence of mitral valve regurgitation. 9. The tricuspid valve is normal in structure. Tricuspid valve regurgitation is not demonstrated. 10. The aortic valve is normal in structure. Aortic valve regurgitation is not visualized. 11. The pulmonic valve was not well visualized. Pulmonic valve regurgitation is not visualized. 12. Normal pulmonary artery systolic pressure. 13. The inferior vena cava is dilated in size with >50% respiratory variability, suggesting right atrial pressure of 8 mmHg.  Patient Profile     68 y.o. female with history of ESRD on HD, HTN, anemia of chronic disease, and GERD who we are seeing for evaluation of atrial flutter in the setting of febrile illness.  Assessment & Plan    1. Atrial flutter with RVR: -In the ED, patient initially noted to have typical atrial flutter with variable AV block with subsequent spontaneous conversion to sinus tachycardia with PACs and brief atrial runs felt to be in the setting of the patient's acute febrile illness -She maintained sinus rhythm with heart rates in the 80s tp 90s bpm until 2 very brief episodes of recurrent atrial flutter with RVR on 12/3 -In this setting, she was started on Eliquis 2.5 mg bid -BP soft in the low 409W systolic on 11/9 leading her Coreg to be changed to Lopressor on 12/2 -Continue Lopressor 75 mg twice daily with titration as indicated -Off diltiazem drip  -CHADS2VASc of 3 (HTN, age x1, sex category) -Echo as above -Plan for outpatient cardiac monitoring in follow up to evaluate for ectopy burden as this could be contributing to pulmonary edema/pleural effusions  2.HTN: -BP remains in the 147W to 295A systolic -Continue current dose of Lopressor as above given  drops in BP with HD  3.ESRD on HD TTS: -HD per nephrology  4.Febrile illness: -Rapid COVID-19 negative -COVID PCR remains pending morning of 12/4 -Blood cultures no growth x 3 days to date -Supportive care per internal medicine  5.Anemia of chronic disease: -Status post 1 unit pRBC 04/04/2019 -Stable -Will need close monitoring now that [facility] has been started -Per IM/nephrology  6. Pleural effusions/diastolic dysfunction: -Volume managed by HD -Remains on torsemide per nephrology   7. Hypokalemia: -Repletion deferred to nephrology given she is  on HD   For questions or updates, please contact [place] Please consult [email or web address] for contact info under Cardiology/STEMI.    Signed, Christell Faith, PA-C [place] Pager: [phone] 04/06/2019, 9:45 AM

## 2019-04-06 NOTE — Discharge Instr - Supplementary Instructions (Signed)
Apixaban

## 2019-04-06 NOTE — Discharge Summary (Signed)
Physician Discharge Summary  Patient ID: Amy [name] MRN: [id number] DOB/AGE: 70-Mar-1952 70 y.o.  Admit date: 04/03/2019 Discharge date: 04/06/2019  Admission Diagnoses: Atrial flutter/fibrillation with RVR Cough ESRD requiring hemodialysis  Discharge Diagnoses:  Principal Problem:   Fever Active Problems:   ESRD (end stage renal disease) (HCC)   Atrial fibrillation with RVR (HCC)   Benign essential HTN   HLD (hyperlipidemia)   Atrial flutter [facility])   Discharged Condition: fair  Hospital Course:  Brief course: Amy [name] a 68 y.o.femalewith medical history significant ofESRD on hemodialysis TTS, HTN, anemia of chronic disease, GERD sent to the hospital from dialysis center for evaluation of fever and atrial fibrillation/RVR. Patient has been having cough/URI type symptoms for the past 5 days. She went to her last dialysis session on Saturday where she was tested for COVID-19 which was negative. At home she is often not had some rhinorrhea, subjective fevers and mild productive cough.  Today she went for her dialysis and prior to starting it she was noted to be tachycardic with low-grade fever. In the ER she was noted to be in atrial flutter/fibrillation with RVR. Saturating greater than 95% on room air. Blood pressure was stable. Chest x-ray showed bilateral small pleural effusion with cardiomegaly. COVID-19 antigen was negative, PCR was negative as well.   Atrial flutter/fibrillation with RVR on admission: Resolved.  Patient is now in  normal sinus rhythm  over 24 hours -Intermittently briefly runs as did this morning -Status postIV Cardizem drip   -Continue Lopressor 75 mg twice daily as per cardiology and titrate for heart rate control: Rx given -Also to discontinue home Coreg as per cardiology. -Patient chads 2 BASC score of 3 with left atrium being mildly dilated, patient has been started on Eliquis 2.5 mg p.o. daily as per cardiology.  Rx given -Per  cardiology "consider 2-week monitor as outpatient" - could be contributing to pulmonary edema and small pleural effusions on exam -Patient is to follow-up with outpatient cardiology -TSH was within normal limit - Echocardiogram on this admission: Left ventricular ejection fraction, by visual estimation, is 65 to 70%. The left ventricle has normal function. There is borderline left ventricular hypertrophy. -Patient was strongly advised to follow the cardiology recommendation  Cough/fevers/upper respiratory congestion -patient remained fever free over 48 hours -Much improved but is still present intermittently  -Mild intermittent dry cough present - COVID-19 Antigen-negative. COVID-19 PCR negative again on 04/03/2019 -Supportive care  -Chest x-ray shows mild bilateral pleural effusion and cardiomegaly but no obvious infiltrates or consolidation -As needed Craige Cotta - Patient received empiric dose of vancomycin and Levaquin given in the ER.     ESRD on hemodialysis TTS - Nephrology consulted.  Patient received hemodialysis on Tuesday Thursday and Saturday -Follow-up outpatient nephrology as recommended  Essential hypertension -Stable but occasionally showed some fluctuation -Some of her home antihypertensive medications were held. -Advised to resume her home and antihypertensive medication except Coreg as it has been discontinued since Lopressor p.o. was a started by cardiology.  Rx for Lopressor has been sent out to her pharmacy -Patient was also given parameters to follow in addition to checking her blood pressure daily  Anemia: Stable  - appears to be anemia of chronic diseases Hemoglobin found to be 6.9 on the following morning compared to 7.1 on admission Rest post 1 unit of PRBC confusion on 04/04/2019 H&H remained above 8 following transfusion Patient may need Epogen shot as per nephrology -Follow-up with PCP in a week or 2  GERD -PPI  Consults: cardiology and  nephrology  Significant Diagnostic Studies: Radiologic imaging  Treatments: As per hospital course and discharge med list  Discharge Exam: Blood pressure (!) 157/77, pulse 84, temperature 99.9 F (37.7 C), resp. rate 19, height 5\' 4"  (1.626 m), weight 54.3 kg, SpO2 93 %.  Constitutional:NAD, calm, comfortable Eyes:PERRL, lids and conjunctivae normal ENMT:Mucous membranes are moist. Posterior pharynx clear of any exudate or lesions.Normal dentition.  Neck:normal, supple, no masses, no thyromegaly Respiratory:Mild bibasilar rhonchi.  Cardiovascular: Regular rate and rhythm;no murmurs / rubs / gallops. No extremity edema. minimal pedal pulses. No carotid bruits.  Abdomen:no tenderness, no masses palpated. No hepatosplenomegaly. Bowel sounds positive.  Musculoskeletal:no clubbing / cyanosis. No joint deformity upper and lower extremities. Good ROM, no contractures. Normal muscle tone.  Skin:no rashes, lesions, ulcers. No induration Neurologic:CN 2-12 grossly intact. Sensation intact, DTR normal. Strength 5/5 in all 4.  Psychiatric:Normal judgment and insight. Alert and oriented x 3. Normal mood.   Disposition: Discharge disposition: 01-Home or Self Care      Discharge Instructions    Call MD for:  difficulty breathing, headache or visual disturbances   Complete by: As directed    Call MD for:  redness, tenderness, or signs of infection (pain, swelling, redness, odor or green/yellow discharge around incision site)   Complete by: As directed    Diet - low sodium heart healthy   Complete by: As directed    And Renal dialysis Diet.   Increase activity slowly   Complete by: As directed      Allergies as of 04/06/2019      Reactions   Cephalosporins    Clarithromycin    Lunesta  [eszopiclone]    Heart racing   Penicillin V    Has patient had a PCN reaction causing immediate rash, facial/tongue/throat swelling, SOB or lightheadedness with hypotension: Yes Has patient  had a PCN reaction causing severe rash involving mucus membranes or skin necrosis: No Has patient had a PCN reaction that required hospitalization No Has patient had a PCN reaction occurring within the last 10 years: No If all of the above answers are "NO", then may proceed with Cephalosporin use.   Sulfa Antibiotics       Medication List    STOP taking these medications   carvedilol 25 MG tablet Commonly known as: COREG   metolazone 2.5 MG tablet Commonly known as: ZAROXOLYN     TAKE these medications   amLODipine 10 MG tablet Commonly known as: NORVASC Take 10 mg by mouth daily.   apixaban 2.5 MG Tabs tablet Commonly known as: ELIQUIS Take 1 tablet (2.5 mg total) by mouth 2 (two) times daily. Notes to patient: GIVEN   benzonatate 100 MG capsule Commonly known as: TESSALON Take 1 capsule (100 mg total) by mouth 2 (two) times daily as needed for cough.   bisacodyl 5 MG EC tablet Commonly known as: DULCOLAX Take 1 tablet (5 mg total) by mouth daily as needed for moderate constipation.   buPROPion 75 MG tablet Commonly known as: WELLBUTRIN TAKE 1 TABLET(75 MG) BY MOUTH TWICE DAILY What changed: See the new instructions.   butalbital-acetaminophen-caffeine 50-325-40 MG tablet Commonly known as: FIORICET TAKE 1 TO 2 TABLETS BY MOUTH EVERY 4 HOURS AS NEEDED FOR HEADACHE What changed: See the new instructions.   calcium acetate 667 MG capsule Commonly known as: PHOSLO Take 667 mg by mouth 3 (three) times daily with meals.   cloNIDine 0.3 MG tablet Commonly known as: CATAPRES  TAKE 1 TABLET(0.3 MG) BY MOUTH EVERY 8 HOURS What changed: See the new instructions.   esomeprazole 20 MG capsule Commonly known as: NEXIUM Take 20 mg by mouth daily.   hydrALAZINE 50 MG tablet Commonly known as: APRESOLINE Take 50 mg by mouth 3 (three) times daily.   LORazepam 1 MG tablet Commonly known as: ATIVAN TAKE 1 TABLET(1 MG) BY MOUTH TWICE DAILY AS NEEDED What changed: See the  new instructions.   losartan 100 MG tablet Commonly known as: COZAAR Take 100 mg by mouth at bedtime.   meloxicam 15 MG tablet Commonly known as: MOBIC Take 15 mg by mouth daily as needed for pain.   Metoprolol Tartrate 75 MG Tabs Take 75 mg by mouth 2 (two) times daily.   multivitamin Tabs tablet Take 1 tablet by mouth daily.   torsemide 100 MG tablet Commonly known as: DEMADEX Take 100 mg by mouth 2 (two) times daily.   zolpidem 10 MG tablet Commonly known as: AMBIEN TAKE 1/2 TO 1 TABLET(5 TO 10 MG) BY MOUTH AT BEDTIME AS NEEDED What changed: See the new instructions.        Signed: Thornell Mule 04/06/2019, 2:31 PM

## 2019-04-06 NOTE — Discharge Instructions (Signed)
Atrial Flutter  Atrial flutter is a type of abnormal heart rhythm (arrhythmia). The heart has an electrical system that tells the heart how to beat. In atrial flutter, the signals move rapidly in the top chambers of the heart (the atria). This makes your heart beat very fast. Atrial flutter can come and go, or it can be permanent. If this condition is not treated it can cause serious complications, such as stroke or weakened heart muscle (cardiomyopathy). What are the causes? This condition may be caused by:  A heart condition or problem, such as: ? A heart attack. ? Heart failure. ? A heart valve problem. ? Heart surgery.  A lung problem, such as: ? A blood clot in the lungs (pulmonary embolism, or PE). ? Chronic obstructive pulmonary disease.  Poorly controlled high blood pressure (hypertension).  Overactive thyroid (hyperthyroidism).  Caffeine.  Some decongestant cold medicines.  Low levels of minerals called electrolytes in the blood.  Cocaine. What increases the risk? You are more likely to develop this condition if:  You are an elderly adult.  You are a man.  You are obese.  You have obstructive sleep apnea.  You have a family history of atrial flutter.  You have diabetes. What are the signs or symptoms? Symptoms of this condition include:  A feeling that your heart is pounding or racing (palpitations).  Shortness of breath.  Chest pain.  Feeling light-headed.  Dizziness.  Fainting.  Low blood pressure (hypotension).  Fatigue. Sometimes there are no symptoms associated with arrhythmia. How is this diagnosed? This condition may be diagnosed based on:  An electrocardiogram (ECG). This is a test that records the electrical signals in the heart.  Ambulatory cardiac monitoring. This is a small recording device that is connected by wires to flat, sticky disks (electrodes) that are attached to your chest.  An echocardiogram. This is a test that uses  sound waves to create pictures of your heart.  A transesophageal echocardiogram (TEE). In this test, a device is placed down your esophagus. This device then uses sounds waves to create even closer pictures of your heart.  Stress test. This test records your heartbeat while you exercise and checks to see if the heart muscle is receiving adequate blood supply. How is this treated? This condition may be treated with:  Medicines to: ? Make your heart beat more slowly. ? Keep your heart in normal rhythm. ? Prevent a stroke.  Cardioversion. This uses medicines or an electrical shock to make the heart beat normally.  Ablation. This destroys the heart tissue that is causing the problem. In some cases, your health care provider will treat other underlying conditions. Follow these instructions at home: Medicines  Take over-the-counter and prescription medicines only as told by your health care provider. ? Make sure you take your medicines exactly as told by your health care provider. ? Do not miss any doses.  Do not take any new medicines without talking to your health care provider. Lifestyle  Eat heart-healthy foods. Talk with a dietitian to make an eating plan that is right for you.  Do not use any products that contain nicotine or tobacco, such as cigarettes and e-cigarettes. If you need help quitting, ask your health care provider.  Limit alcohol intake to no more than 1 drink per day for nonpregnant women and 2 drinks per day for men. One drink equals 12 oz of beer, 5 oz of wine, or 1 oz of hard liquor.  Try to reduce any  stress. Stress can make your symptoms worse.  Get screened for sleep apnea. If you have the condition, work with your health care provider to find a treatment that works for you.  Do not use drugs.  Avoid excessive caffeine. General instructions  Lose weight if your health care provider tells you to do that.  Keep all follow-up visits as told by your health  care provider. This is important. Contact a health care provider if:  Your symptoms get worse.  You notice that your palpitations are increasing. Get help right away if:  You have any symptoms of a stroke. "BE FAST" is an easy way to remember the main warning signs of a stroke: ? B - Balance. Signs are dizziness, sudden trouble walking, or loss of balance. ? E - Eyes. Signs are trouble seeing or a sudden change in vision. ? F - Face. Signs are sudden weakness or numbness of the face, or the face or eyelid drooping on one side. ? A - Arms. Signs are weakness or numbness in an arm. This happens suddenly and usually on one side of the body. ? S - Speech. Signs are sudden trouble speaking, slurred speech, or trouble understanding what people say. ? T - Time. Time to call emergency services. Write down what time symptoms started.  You have other signs of a stroke, such as: ? A sudden, severe headache with no known cause. ? Nausea or vomiting. ? Seizure.  You have additional symptoms, such as: ? Fainting. ? Shortness of breath. ? Pain or pressure in your chest. ? Suddenly feeling nauseous or suddenly vomiting. ? Increased sweating with no known cause.  These symptoms may represent a serious problem that is an emergency. Do not wait to see if the symptoms will go away. Get medical help right away. Call your local emergency services (911 in the [place] Do not drive yourself to the hospital. Summary  Atrial flutter is an abnormal heart rhythm that can give you symptoms of palpitations, shortness of breath, or fatigue.  Atrial flutter is often treated with medicines to keep your heart in a normal rhythm and to prevent a stroke.  You should seek immediate help if you cannot catch your breath, have chest pain or pressure, or have weakness, especially on one side of your body. This information is not intended to replace advice given to you by your health care provider. Make sure you discuss any  questions you have with your health care provider. Document Released: 09/05/2008 Document Revised: 01/06/2018 Document Reviewed: 01/20/2017 Elsevier Patient Education  2020 [place].   Dialysis Fistulogram, Care After This sheet gives you information about how to care for yourself after your procedure. Your health care provider may also give you more specific instructions. If you have problems or questions, contact your health care provider. What can I expect after the procedure? After the procedure, it is common to have:  A small amount of discomfort in the area where the small, thin tube (catheter) was placed for the procedure.  A small amount of bruising around the fistula.  Sleepiness and tiredness (fatigue). Follow these instructions at home: Activity   Rest at home and do not lift anything that is heavier than 5 lb (2.3 kg) on the day after your procedure.  Return to your normal activities as told by your health care provider. Ask your health care provider what activities are safe for you.  Do not drive or use heavy machinery while taking prescription pain medicine.  Do not drive for 24 hours if you were given a medicine to help you relax (sedative) during your procedure. Medicines   Take over-the-counter and prescription medicines only as told by your health care provider. Puncture site care  Follow instructions from your health care provider about how to take care of the site where catheters were inserted. Make sure you: ? Wash your hands with soap and water before you change your bandage (dressing). If soap and water are not available, use hand sanitizer. ? Change your dressing as told by your health care provider. ? Leave stitches (sutures), skin glue, or adhesive strips in place. These skin closures may need to stay in place for 2 weeks or longer. If adhesive strip edges start to loosen and curl up, you may trim the loose edges. Do not remove adhesive strips  completely unless your health care provider tells you to do that.  Check your puncture area every day for signs of infection. Check for: ? Redness, swelling, or pain. ? Fluid or blood. ? Warmth. ? Pus or a bad smell. General instructions  Do not take baths, swim, or use a hot tub until your health care provider approves. Ask your health care provider if you may take showers. You may only be allowed to take sponge baths.  Monitor your dialysis fistula closely. Check to make sure that you can feel a vibration or buzz (a thrill) when you put your fingers over the fistula.  Prevent damage to your graft or fistula: ? Do not wear tight-fitting clothing or jewelry on the arm or leg that has your graft or fistula. ? Tell all your health care providers that you have a dialysis fistula or graft. ? Do not allow blood draws, IVs, or blood pressure readings to be done in the arm that has your fistula or graft. ? Do not allow flu shots or vaccinations in the arm with your fistula or graft.  Keep all follow-up visits as told by your health care provider. This is important. Contact a health care provider if:  You have redness, swelling, or pain at the site where the catheter was put in.  You have fluid or blood coming from the catheter site.  The catheter site feels warm to the touch.  You have pus or a bad smell coming from the catheter site.  You have a fever or chills. Get help right away if:  You feel weak.  You have trouble balancing.  You have trouble moving your arms or legs.  You have problems with your speech or vision.  You can no longer feel a vibration or buzz when you put your fingers over your dialysis fistula.  The limb that was used for the procedure: ? Swells. ? Is painful. ? Is cold. ? Is discolored, such as blue or pale white.  You have chest pain or shortness of breath. Summary  After a dialysis fistulogram, it is common to have a small amount of discomfort or  bruising in the area where the small, thin tube (catheter) was placed.  Rest at home on the day after your procedure. Return to your normal activities as told by your health care provider.  Take over-the-counter and prescription medicines only as told by your health care provider.  Follow instructions from your health care provider about how to take care of the site where the catheter was inserted.  Keep all follow-up visits as told by your health care provider. This information is not intended to  replace advice given to you by your health care provider. Make sure you discuss any questions you have with your health care provider. Document Released: 09/03/2013 Document Revised: 05/20/2017 Document Reviewed: 05/20/2017 Elsevier Patient Education  2020 [place].   Vascular Access for Hemodialysis        A vascular access is a connection to the blood inside your blood vessels that allows blood to be easily removed from your body and returned to your body during kidney dialysis (hemodialysis). Hemodialysis is a procedure in which a machine outside of the body filters the blood of a person whose kidneys are no longer working properly. There are three types of vascular accesses:  Arteriovenous fistula (AVF). This is a connection between an artery and a vein (usually in the arm) that is made by sewing them together. Blood in the artery flows directly into the vein, causing it to get larger over time. This makes it easier for the vein to be used for hemodialysis. An arteriovenous fistula takes 1-6 months to develop after surgery.  Arteriovenous graft (AVG). This is a connection between an artery and a vein in the arm that is made with a tube. An arteriovenous graft can be used within 2-3 weeks of surgery.  Venous catheter. This is a thin, flexible tube that is placed in a large vein (usually in the neck, chest, or groin). A venous catheter for hemodialysis contains two tubes that come out of  the skin. A venous catheter can be used right away. It is usually used as a temporary access if you need hemodialysis before a fistula or graft has developed, or if kidney failure is sudden (acute) and likely to improve without the need for long-term dialysis. It may also be used as a permanent access if a fistula or graft cannot be created. Which type of access is best for me? The type of access that is best for you depends on the size and strength of your veins, your age, and any other health problems that you have, such as diabetes. An ultrasound test may be used to look at your veins to help make this decision. A fistula is usually the preferred type of access. It can last several years and is less likely than the other types of accesses to become infected or to cause a blood clot within a blood vessel (thrombosis). However, a fistula is not an option for everyone. If your veins are not the right size or if the fistula does not develop properly, a graft may be used instead. Grafts require you to have strong veins. If your veins are not strong enough for a graft, a catheter may be used. Catheters are more likely than fistulas and grafts to become infected or to have a thrombosis. Sometimes, only one type of access is an option. Your health care provider will help you determine which type of access is best for you. How is a vascular access used? The way that the access is used depends on the type of access:  If the access is a fistula or graft, two needles are inserted through the skin into the access before each hemodialysis session. Blood leaves the body through one of the needles and travels through a tube to the hemodialysis machine (dialyzer). Then it flows through another tube and returns to the body through the second needle.  If the access is a catheter, one tube is connected directly to the tube that leads to the dialyzer, and the other tube is  connected to a tube that leads away from the  dialyzer. Blood leaves the body through one tube and returns to the body through the other. What problems can occur with vascular access?  A blood clot within a blood vessel (thrombosis). Thrombosis can lead to a narrowing of a blood vessel (stenosis). If thrombosis occurs frequently, another access site may be created as a backup.  Infection.  Heart enlargement (cardiomegaly) and heart failure. Changes in blood flow may cause an increase in blood pressure or heart rate, making your heart work harder to pump blood. These problems are most likely to occur with a venous catheter and least likely to occur with an arteriovenous fistula. How do I care for my vascular access?  Wear a medical alert bracelet. In case of an emergency, this bracelet tells health care providers that you are a dialysis patient and allows them to care for your veins appropriately. If you have a graft or fistula:  A "bruit" is a noise that is heard with a stethoscope, and a "thrill" is a vibration that is felt over the graft or fistula. The presence of the bruit and thrill indicates that the access is working. You will be taught to feel for the thrill each day. If this is not felt, the access may be clotted. Call your health care provider.  Keep your arm straight and raised (elevated) above your heart while the access site is healing.  You may freely use the arm where your vascular access is located after the site heals. Keep the following in mind: ? Avoid pressure on the arm. ? Avoid lifting heavy objects with the arm. ? Avoid sleeping on the arm. ? Avoid wearing tight-sleeved shirts or jewelry around the graft or fistula.  Do not allow blood pressure monitoring or needle punctures on the side where the graft or fistula is located.  With permission from your health care provider, you may do exercises to help with blood flow through a fistula. These exercises involve squeezing a rubber ball or other soft objects as  instructed.  Wash your access site according to directions from your health care provider. If you have a venous catheter:  Keep the insertion site clean and dry at all times.  If you are told you can shower after the site heals, use a protective covering over the catheter to keep it dry.  Follow directions from your health care provider for bandage (dressing) changes.  Ask your health care provider what activities are safe for you. You may be restricted from lifting or making repetitive arm movements on the side with the catheter. Contact a health care provider if:  Swelling around the graft or fistula gets worse.  You develop new pain.  Your catheter gets damaged. Get help right away if:  You have pain, numbness, an unusual pale skin color, or blue fingers or sores at the tips of your fingers in the hand on the side of your fistula.  You have chills.  You have a fever.  You have pus or other fluid (drainage) at the vascular access site.  You develop skin redness or red streaking on the skin around, above, or below the vascular access.  You have bleeding at the vascular access that cannot be easily controlled.  Your catheter gets pulled out of place.  You feel your heart racing or skipping beats.  You have chest pain. Summary  A vascular access is a connection to the blood inside your blood vessels that allows  blood to be easily removed from your body and returned to your body during kidney dialysis (hemodialysis).  There are three types of vascular accesses.The type of access that is best for you depends on the size and strength of your veins, your age, and any other health problems that you have, such as diabetes.  A fistula is usually the preferred type of access, although it is not an option for everyone. It can last several years and is less likely than the other types of accesses to become infected or to cause a blood clot within a blood vessel (thrombosis).  Wear  a medical alert bracelet. In case of an emergency, this tells health care providers that you are a dialysis patient. This information is not intended to replace advice given to you by your health care provider. Make sure you discuss any questions you have with your health care provider. Document Released: 07/10/2002 Document Revised: 08/08/2018 Document Reviewed: 05/14/2016 Elsevier Patient Education  2020 [place].   Dialysis Dialysis is a procedure that is done when the kidneys have stopped working properly (kidney failure). It may also be done earlier if it may help improve symptoms. During dialysis, wastes, salt, and extra water are removed from the blood, and the levels of certain minerals in the blood are maintained. Dialysis is done in sessions which are continued until the kidneys get better. If the kidneys cannot get better, such as in end-stage kidney disease, dialysis is continued for life or until you receive a new kidney from a donor (kidney transplant). There are two types of dialysis: hemodialysis and peritoneal dialysis. What is hemodialysis?        Hemodialysis is when a machine called a dialyzer is used to filter the blood. Before starting hemodialysis, you will have surgery to create a site where blood can be removed from the body and returned to the body (vascular access). There are three types of vascular accesses:  Arteriovenous fistula. This type of access is created when an artery and a vein (usually in the arm) are connected during surgery. The arteriovenous fistula usually takes 1-6 months to develop after surgery. It may last longer than the other types of vascular accesses and is less likely to become infected or cause blood clots.  Arteriovenous graft. This type of access is created when an artery and a vein in the arm are connected during surgery with a tube. An arteriovenous graft can usually be used within 2-3 weeks of surgery.  A venous catheter. To create  this type of access, a thin tube (catheter) is placed in a large vein in your neck, chest, or groin. A venous catheter can be used right away. It is usually used as a temporary access when dialysis needs to begin immediately. During hemodialysis, blood leaves your body through your access site. It travels through a tube to the dialyzer, where it is filtered. The blood then returns to your body through another tube. Hemodialysis is usually done at a hospital or dialysis center three times a week. Visits last about 3-5 hours. With special training, it may also be done at home with the help of another person. What is peritoneal dialysis? Peritoneal dialysis is when the thin lining of the abdomen (peritoneum) and a fluid called dialysate are used to filter the blood. Before starting peritoneal dialysis, you will have surgery to place a catheter in your abdomen. The catheter will be used to transfer dialysate to and from your abdomen. At the start of  a session, your abdomen is filled with dialysate. During the session, wastes, salt, and extra water in the blood pass through the peritoneum and into the dialysate. The dialysate is drained from the body at the end of the session. The process of filling and draining the dialysate is called an exchange. Exchanges are repeated until you have used up all the dialysate for the day. You may do peritoneal dialysis at home or at almost any other location. It is done every day. You may need up to five exchanges a day. Each exchange takes about 30-40 minutes. The amount of time the dialysate is in your body between exchanges is called a dwell. The dwell usually lasts 1.5-3 hours and can vary with each person. You may choose to do exchanges at night while you sleep, using a machine called a cycler. Which type of dialysis should I choose? Both types of dialysis have advantages and disadvantages. Talk with your health care provider about which type of dialysis is best for you.  Your lifestyle, preferences, and medical condition should be considered. In some cases, only one type of dialysis can be chosen. Advantages of hemodialysis  It is done less often than peritoneal dialysis.  Someone else can do the dialysis for you.  If you go to a dialysis center: ? Your health care provider can recognize any problems you may be having. ? You can interact with others who are having dialysis. This can provide you with emotional support. Disadvantages of hemodialysis  Hemodialysis may cause cramps and low blood pressure. It may leave you feeling tired on the days you have the treatment.  If you go to a dialysis center, you will need to make weekly appointments and work around the centers schedule.  You will need to take extra care when traveling. If you usually get treatment in a dialysis center, you will need to arrange to visit a dialysis center near your destination. If you are having treatments at home, you will need to take the dialyzer with you when traveling.  There are more eating restrictions than with peritoneal dialysis. Advantages of peritoneal dialysis  It is less likely than hemodialysis to cause cramps and low blood pressure.  There are fewer eating restrictions than with hemodialysis.  You may do exchanges on your own wherever you are, including when you travel. Disadvantages of peritoneal dialysis  It is done more often than hemodialysis.  Doing peritoneal dialysis requires you to have a good use (dexterity) of your hands. You must also be able to lift bags.  You must learn how to make your equipment free of germs (sterilization techniques). You will need to use these techniques every day to prevent infection. What changes will I need to make to my diet during dialysis? Both types of dialysis require you to make some changes to your diet. For example, you will need to limit your intake of foods that contain a lot of phosphorus and potassium. You will  also need to limit your fluid intake. A diet and nutrition specialist (dietitian) can help you make a meal plan that can help improve your dialysis and your health. What should I expect when starting dialysis? Adjusting to the dialysis treatment, schedule, and diet can take some time. You may need to stop working and may not be able to do some of your normal activities. You may feel anxious or depressed when starting dialysis. Over time, many people feel better overall because of dialysis. You may be able to  return to work after making some changes, such as reducing work intensity. Where to find more information  [place]: www.kidney.org  American [employer] of Kidney Patients: BombTimer.gl  [employer]: www.kidneyfund.org Summary  During dialysis, wastes, salt, and extra water are removed from the blood, and the levels of certain minerals in the blood are maintained. There are two types of dialysis: hemodialysis and peritoneal dialysis.  Hemodialysis is when a machine called a dialyzer is used to filter the blood.  Hemodialysis is usually done by a health care provider at a hospital or dialysis center three times a week.  Peritoneal dialysis is when the peritoneum is used as a filter. You may do peritoneal dialysis at home or at almost any other location.  Both types of dialysis have advantages and disadvantages. Talk with your health care provider about which type of dialysis is best for you. This information is not intended to replace advice given to you by your health care provider. Make sure you discuss any questions you have with your health care provider. Document Released: 07/10/2002 Document Revised: 08/09/2018 Document Reviewed: 06/15/2016 Elsevier Patient Education  2020 [employer].   Dialysis Fistulogram  A dialysis fistulogram is an X-ray test to look inside the site where blood is removed and returned to your body during dialysis. Fistulas and  grafts provide easy and effective access for dialysis. However, sometimes problems can occur. The fistula or graft can become clogged or narrow. This can make dialysis less effective. You may need to have a fistulogram to check for these problems. If a problem is found, your health care provider can do treatments during the procedure to clear the blocked or narrowed areas. Treatments to open a blocked dialysis fistula or graft may include:  Removing the clot from the fistula or graft with a device (thrombectomy).  A procedure to open or widen the blocked area with a balloon (angioplasty).  Placing a small mesh tube (stent) to keep the fistula or graft open.  Injecting a medicine to dissolve the clot (thrombolysis). Tell a health care provider about:  Any allergies you have, including any reactions you have had to contrast dye.  All medicines you are taking, including vitamins, herbs, eye drops, creams, and over-the-counter medicines.  Any problems you or family members have had with anesthetic medicines.  Any blood disorders you have.  Any surgeries you have had.  Any medical conditions you have.  Whether you are pregnant or may be pregnant.  Any pain, redness, or swelling you have in your limb that has the dialysis fistula or graft.  Any bleeding from your dialysis fistula or graft.  Any of the following in the part of your body where the dialysis fistula or graft leads: ? Numbness. ? Tingling. ? A cold feeling. ? Areas that are bluish or pale white in color. What are the risks? Generally, this is a safe procedure. However, problems may occur, including:  Infection.  Bleeding.  Allergic reactions to medicines or dyes.  Damage to other structures, such as nearby nerves or blood vessels.  A blood clot in the dialysis fistula or graft.  A blood clot that travels to your lungs (pulmonary embolism). What happens before the procedure? General instructions  Follow  instructions from your health care provider about eating and drinking restrictions.  Ask your health care provider about: ? Changing or stopping your regular medicines. This is especially important if you are taking diabetes medicines or blood thinners (anticoagulants). ? Taking medicines  such as aspirin and ibuprofen. These medicines can thin your blood. Do not take these medicines unless your health care provider tells you to take them. ? Taking over-the-counter medicines, vitamins, herbs, and supplements.  Plan to have someone take you home from the hospital or clinic.  Plan to have a responsible adult care for you for at least 24 hours after you leave the hospital or clinic. This is important.  You may have a blood or urine sample taken. These will help your health care provider learn how well your kidneys and liver are working and how well your blood clots. What happens during the procedure?  An IV will be inserted into one of your veins.  You will be given one or more of the following: ? A medicine to help you relax (sedative). ? A medicine to numb the area (local anesthetic) on the limb with your fistula or graft.  A needle will be inserted into your vein.  A small, thin tube (catheter) will be inserted into the needle and guided to the area of possible problems.  Dye (contrast material) will be injected into the catheter. As the contrast material passes through your body, you may feel warm.  X-rays will be taken. The contrast material will show where any narrowing or blockages are.  If narrowing or a blockage is seen, the health care provider may do one of the following to open the blockage: ? Thrombectomy. The health care provider will use a mechanical device at the end of the catheter to break up the clot. ? Angioplasty. The health care provider will advance a guide wire and balloon-tipped catheter to the blockage site. The balloon will be inflated for a short period of time.  A stent may also be placed to keep the blocked area open. ? Thrombolysis. The health care provider will deliver a clot-dissolving medicine through the catheter.  When the procedure is complete, the catheter will be removed.  In some cases, the catheter site will be closed with stitches (sutures).  Pressure will be applied to stop any bleeding, and your skin will be covered with a bandage (dressing). The procedure may vary among health care providers and hospitals. What happens after the procedure?  Your blood pressure, heart rate, breathing rate, and blood oxygen level will be monitored until you leave the hospital or clinic. Your health care provider will also monitor your access site.  You will need to stay in bed for several hours.  Do not drive for 24 hours if you were given a sedative during your procedure. Summary  A dialysis fistulogram is an X-ray test to look inside the site where blood is removed and returned to your body during dialysis.  If a problem is found, your health care provider can also do treatments during the procedure to clear the blocked or narrowed areas. This information is not intended to replace advice given to you by your health care provider. Make sure you discuss any questions you have with your health care provider. Document Released: 09/03/2013 Document Revised: 05/20/2017 Document Reviewed: 05/20/2017 Elsevier Patient Education  2020 [employer].

## 2019-04-07 DIAGNOSIS — D509 Iron deficiency anemia, unspecified: Secondary | ICD-10-CM | POA: Diagnosis not present

## 2019-04-07 DIAGNOSIS — N2581 Secondary hyperparathyroidism of renal origin: Secondary | ICD-10-CM | POA: Diagnosis not present

## 2019-04-07 DIAGNOSIS — Z992 Dependence on renal dialysis: Secondary | ICD-10-CM | POA: Diagnosis not present

## 2019-04-07 DIAGNOSIS — D631 Anemia in chronic kidney disease: Secondary | ICD-10-CM | POA: Diagnosis not present

## 2019-04-07 DIAGNOSIS — N186 End stage renal disease: Secondary | ICD-10-CM | POA: Diagnosis not present

## 2019-04-08 LAB — CULTURE, BLOOD (ROUTINE X 2)
Culture: NO GROWTH
Culture: NO GROWTH
Special Requests: ADEQUATE
Special Requests: ADEQUATE

## 2019-04-09 ENCOUNTER — Telehealth: Payer: Self-pay | Admitting: *Deleted

## 2019-04-09 ENCOUNTER — Telehealth: Payer: Self-pay

## 2019-04-09 ENCOUNTER — Telehealth: Payer: Self-pay | Admitting: Family Medicine

## 2019-04-09 MED ORDER — AMOXICILLIN 500 MG PO CAPS: 1000.0000 mg | ORAL_CAPSULE | Freq: Three times a day (TID) | ORAL | 0 refills | Status: DC

## 2019-04-09 MED ORDER — LEVOFLOXACIN 500 MG PO TABS: 500.0000 mg | ORAL_TABLET | Freq: Every day | ORAL | 0 refills | Status: DC

## 2019-04-09 NOTE — Telephone Encounter (Signed)
RX canceled. Patient advised RX has been changed.

## 2019-04-09 NOTE — Telephone Encounter (Signed)
Patient requesting a call back from PCP to discuss recent hospital visit. She is requesting antibiotic. Patient declined virtual hospital follow up visit.

## 2019-04-09 NOTE — Telephone Encounter (Signed)
From [facility] 

## 2019-04-09 NOTE — Telephone Encounter (Signed)
Patient states she prefers not taking levofloxacin (LEVAQUIN) 500 MG tablet due to the join pain side effects, requesting PCP prescribe amoxicillin, please advise  [id number] - [place], [place]

## 2019-04-09 NOTE — Telephone Encounter (Signed)
Have sent prescription for amoxicillin.  Please cancel the levofloxacin prescription that was sent earlier.

## 2019-04-09 NOTE — Telephone Encounter (Signed)
-----   Message from Emily Filbert, RN sent at 04/06/2019  4:51 PM EST -----  ----- Message ----- From: Minna Merritts, MD Sent: 04/06/2019   4:31 PM EST To: [facility] Triage, [facility] Scheduling  Recent hospitalization for atrial flutter Needs post hospital follow-up, TCM Follow-up gollan or APP/ryan

## 2019-04-09 NOTE — Telephone Encounter (Signed)
Start levofloxacin 500mg  daily for 7 days.  She can have the 10:40 same day slot on Friday for hospital follow up.

## 2019-04-09 NOTE — Telephone Encounter (Signed)
HFU not scheduled. 

## 2019-04-09 NOTE — Telephone Encounter (Signed)
Transition Care Management Follow-up Telephone Call  Date of discharge and from where: [facility] on 04/06/19  How have you been since you were released from the hospital? Doing ok, heart rate has decreased and is no longer rapid. Pt is still running a fever. Last reading was 100.2. Pt still has a cough and congestion. Sputum is yellow. Declines pain fever or n/v/d.  Any questions or concerns? No   Items Reviewed:  Did the pt receive and understand the discharge instructions provided? Yes   Medications obtained and verified? Yes   Any new allergies since your discharge? No   Dietary orders reviewed? Yes  Do you have support at home? Yes   Other (ie: DME, Home Health, etc) N/A  Functional Questionnaire: (I = Independent and D = Dependent)  Bathing/Dressing- I   Meal Prep- I  Eating- I  Maintaining continence- I  Transferring/Ambulation- I  Managing Meds- I   Follow up appointments reviewed:    PCP Hospital f/u appt confirmed? No , no available HFU apts in the next 2 weeks. Message sent to PCP about apt.   [facility] f/u appt confirmed? N/A  Are transportation arrangements needed? No   If their condition worsens, is the pt aware to call  their PCP or go to the ED? Yes  Was the patient provided with contact information for the PCP's office or ED? Yes  Was the pt encouraged to call back with questions or concerns? Yes

## 2019-04-09 NOTE — Telephone Encounter (Signed)
RX sent to [facility]. Hospital follow up scheduled for 12/11

## 2019-04-10 DIAGNOSIS — N186 End stage renal disease: Secondary | ICD-10-CM | POA: Diagnosis not present

## 2019-04-10 DIAGNOSIS — D509 Iron deficiency anemia, unspecified: Secondary | ICD-10-CM | POA: Diagnosis not present

## 2019-04-10 DIAGNOSIS — Z992 Dependence on renal dialysis: Secondary | ICD-10-CM | POA: Diagnosis not present

## 2019-04-10 DIAGNOSIS — N2581 Secondary hyperparathyroidism of renal origin: Secondary | ICD-10-CM | POA: Diagnosis not present

## 2019-04-10 DIAGNOSIS — D631 Anemia in chronic kidney disease: Secondary | ICD-10-CM | POA: Diagnosis not present

## 2019-04-10 NOTE — Telephone Encounter (Signed)
Attempted to schedule. lmov to call office .

## 2019-04-11 NOTE — Telephone Encounter (Signed)
lmov to schedule  °

## 2019-04-12 DIAGNOSIS — D631 Anemia in chronic kidney disease: Secondary | ICD-10-CM | POA: Diagnosis not present

## 2019-04-12 DIAGNOSIS — Z992 Dependence on renal dialysis: Secondary | ICD-10-CM | POA: Diagnosis not present

## 2019-04-12 DIAGNOSIS — N2581 Secondary hyperparathyroidism of renal origin: Secondary | ICD-10-CM | POA: Diagnosis not present

## 2019-04-12 DIAGNOSIS — D509 Iron deficiency anemia, unspecified: Secondary | ICD-10-CM | POA: Diagnosis not present

## 2019-04-12 DIAGNOSIS — N186 End stage renal disease: Secondary | ICD-10-CM | POA: Diagnosis not present

## 2019-04-13 ENCOUNTER — Inpatient Hospital Stay: Payer: Medicare Other | Admitting: Family Medicine

## 2019-04-13 NOTE — Telephone Encounter (Signed)
Patient contacted regarding discharge from [facility] on 04/06/2019.  Patient understands to follow up with provider on 12/18 at 2:20 PM at [facility]. Patient understands discharge instructions? Yes Patient understands medications and regiment? Yes Patient understands to bring all medications to this visit? Yes  Appointment confirmed with no further questions at this time.

## 2019-04-14 DIAGNOSIS — N186 End stage renal disease: Secondary | ICD-10-CM | POA: Diagnosis not present

## 2019-04-14 DIAGNOSIS — D631 Anemia in chronic kidney disease: Secondary | ICD-10-CM | POA: Diagnosis not present

## 2019-04-14 DIAGNOSIS — N2581 Secondary hyperparathyroidism of renal origin: Secondary | ICD-10-CM | POA: Diagnosis not present

## 2019-04-14 DIAGNOSIS — Z992 Dependence on renal dialysis: Secondary | ICD-10-CM | POA: Diagnosis not present

## 2019-04-14 DIAGNOSIS — D509 Iron deficiency anemia, unspecified: Secondary | ICD-10-CM | POA: Diagnosis not present

## 2019-04-17 DIAGNOSIS — D631 Anemia in chronic kidney disease: Secondary | ICD-10-CM | POA: Diagnosis not present

## 2019-04-17 DIAGNOSIS — Z992 Dependence on renal dialysis: Secondary | ICD-10-CM | POA: Diagnosis not present

## 2019-04-17 DIAGNOSIS — N186 End stage renal disease: Secondary | ICD-10-CM | POA: Diagnosis not present

## 2019-04-17 DIAGNOSIS — N2581 Secondary hyperparathyroidism of renal origin: Secondary | ICD-10-CM | POA: Diagnosis not present

## 2019-04-17 DIAGNOSIS — D509 Iron deficiency anemia, unspecified: Secondary | ICD-10-CM | POA: Diagnosis not present

## 2019-04-18 ENCOUNTER — Other Ambulatory Visit: Payer: Self-pay | Admitting: Family Medicine

## 2019-04-18 NOTE — Telephone Encounter (Signed)
Patient states she still has constant cough.  Michela Pitcher it got a little better but it is not going away.  She had to reschedule her hospital f/u from 04/13/19 to 05/07/19  Cough is not very productive

## 2019-04-18 NOTE — Telephone Encounter (Signed)
Requested medication (s) are due for refill today: no  Requested medication (s) are on the active medication list: yes  Last refill: 04/09/2019  Future visit scheduled: yes  Notes to clinic:  medication not assigned to protocol   Requested Prescriptions  Pending Prescriptions Disp Refills   amoxicillin (AMOXIL) 500 MG capsule [Pharmacy Med Name: AMOXICILLIN 500MG  CAPSULES] 42 capsule 0    Sig: TAKE 2 CAPSULES(1000 MG) BY MOUTH THREE TIMES DAILY FOR 7 DAYS      Off-Protocol Failed - 04/18/2019  7:52 AM      Failed - Medication not assigned to a protocol, review manually.      Passed - Valid encounter within last 12 months    Recent Outpatient Visits           5 months ago CKD (chronic kidney disease) stage 5, GFR less than 15 ml/min [facility])   [facility], Dionne Bucy, MD   5 months ago Cellulitis of lower extremity, unspecified laterality   [facility] [name], MD   6 months ago Cellulitis of lower extremity, unspecified laterality   [place], [place]   6 months ago Leg swelling   [facility] [name], MD   7 months ago [place], Clearnce Sorrel, [place]       Future Appointments             In 2 days Sharolyn Douglas, Clance Boll, NP [facility], [facility]   In 2 weeks Caryn Section, Kirstie Peri, MD [facility], [place]

## 2019-04-19 ENCOUNTER — Other Ambulatory Visit: Payer: Self-pay | Admitting: Family Medicine

## 2019-04-19 DIAGNOSIS — Z992 Dependence on renal dialysis: Secondary | ICD-10-CM | POA: Diagnosis not present

## 2019-04-19 DIAGNOSIS — D509 Iron deficiency anemia, unspecified: Secondary | ICD-10-CM | POA: Diagnosis not present

## 2019-04-19 DIAGNOSIS — R519 Headache, unspecified: Secondary | ICD-10-CM

## 2019-04-19 DIAGNOSIS — N186 End stage renal disease: Secondary | ICD-10-CM | POA: Diagnosis not present

## 2019-04-19 DIAGNOSIS — D631 Anemia in chronic kidney disease: Secondary | ICD-10-CM | POA: Diagnosis not present

## 2019-04-19 DIAGNOSIS — N2581 Secondary hyperparathyroidism of renal origin: Secondary | ICD-10-CM | POA: Diagnosis not present

## 2019-04-20 ENCOUNTER — Ambulatory Visit: Payer: Medicare Other | Admitting: Family Medicine

## 2019-04-20 ENCOUNTER — Other Ambulatory Visit: Payer: Self-pay | Admitting: Cardiovascular Disease

## 2019-04-20 ENCOUNTER — Other Ambulatory Visit: Payer: Self-pay

## 2019-04-20 MED ORDER — HYDRALAZINE HCL 50 MG PO TABS: 50.0000 mg | ORAL_TABLET | Freq: Three times a day (TID) | ORAL | 0 refills | Status: DC

## 2019-04-20 NOTE — Telephone Encounter (Signed)
Received refill request for hydralazine 50mg  one tablet PO TID from [facility]. Hydralazine listed un der Historical Provider. Patient last seen 04/14/2018 and was advised to return in 3 months. Did not show for appointment today. Seen in ER on 04/03/2019 for A Flutter. Please advise if OK to refill. Thank you!

## 2019-04-20 NOTE — Addendum Note (Signed)
Addended by: Verlon Au on: 04/20/2019 04:12 PM   Modules accepted: Orders

## 2019-04-20 NOTE — Progress Notes (Deleted)
Cardiology Office Note  Date: 04/20/2019   ID: Amy [name], DOB 1951/01/08, MRN [id number]  PCP:  [name], MD  Cardiologist:  No primary care provider on file. Electrophysiologist:  None   No chief complaint on file.   History of Present Illness: Amy [name] is a 68 y.o. female with HX of ESRD on hemodialysis, HTN , ACD, GERD. Last saw Dr Rockey Situ  April 14, 2018.   Recent admission  for A, Fib with RVR, cough, ESRD requiring dialysis. D/c  Dx included : Fever, ESRD, A Fib with RVR. HTN, HLD, Atrial flutter. Hemoglobin was 8.6b and HCT 25.3 She received in hospital blood transfusion on 04/09/2019.   Past Medical History:  Diagnosis Date  . Anemia    a. 08/2016 Guaiac + stool. EGD w/ gastritis.  . Anxiety   . CKD (chronic kidney disease), stage III   . Colon polyp    a. 08/2016 Colonoscopy.  . Gastritis    a. 08/2016 EGD: Gastritis-->PPI.  [name] GERD (gastroesophageal reflux disease)   . History of chicken pox   . History of measles as a child   . Hypertension   . Hyponatremia    a. 08/2016 in setting of HCTZ Rx.  . Multiple thyroid nodules     Past Surgical History:  Procedure Laterality Date  . ABDOMINAL HYSTERECTOMY  2003   due to heavy periods and clotting during menses  . BREAST LUMPECTOMY Left 1990  . CHOLECYSTECTOMY  2010  . COLONOSCOPY WITH PROPOFOL N/A 09/07/2016   Procedure: COLONOSCOPY WITH PROPOFOL;  Surgeon: Lucilla Lame, MD;  Location: [facility] ENDOSCOPY;  Service: Endoscopy;  Laterality: N/A;  . DIALYSIS/PERMA CATHETER INSERTION N/A 11/16/2018   Procedure: DIALYSIS/PERMA CATHETER INSERTION;  Surgeon: Algernon Huxley, MD;  Location: [facility] LAB;  Service: Cardiovascular;  Laterality: N/A;  . DILATION AND CURETTAGE OF UTERUS  1985  . ESOPHAGOGASTRODUODENOSCOPY (EGD) WITH PROPOFOL N/A 09/07/2016   Procedure: ESOPHAGOGASTRODUODENOSCOPY (EGD) WITH PROPOFOL;  Surgeon: Lucilla Lame, MD;  Location: [facility] ENDOSCOPY;  Service: Endoscopy;  Laterality: N/A;  .  GALLBLADDER SURGERY  2012  . KNEE SURGERY     Dr. Tamala Julian  . MRI, Lumbar spine  07/23/2009   Multilevel annular bulge and disc protrusion at L2-L3, L3-L4, L4-L5, and L5-S1  . TONSILLECTOMY    . TONSILLECTOMY AND ADENOIDECTOMY  1970  . UPPER GI ENDOSCOPY  03/02/2006   H. Pylori negative; Dr. Allen Norris    Current Outpatient Medications  Medication Sig Dispense Refill  . amLODipine (NORVASC) 10 MG tablet Take 10 mg by mouth daily.    [name] amoxicillin (AMOXIL) 500 MG capsule TAKE 2 CAPSULES(1000 MG) BY MOUTH THREE TIMES DAILY FOR 7 DAYS 42 capsule 0  . apixaban (ELIQUIS) 2.5 MG TABS tablet Take 1 tablet (2.5 mg total) by mouth 2 (two) times daily. 60 tablet 0  . benzonatate (TESSALON) 100 MG capsule Take 1 capsule (100 mg total) by mouth 2 (two) times daily as needed for cough. 20 capsule 0  . bisacodyl (DULCOLAX) 5 MG EC tablet Take 1 tablet (5 mg total) by mouth daily as needed for moderate constipation. (Patient not taking: Reported on 04/09/2019) 30 tablet 0  . buPROPion (WELLBUTRIN) 75 MG tablet TAKE 1 TABLET(75 MG) BY MOUTH TWICE DAILY (Patient taking differently: Take 75 mg by mouth 2 (two) times daily. ) 180 tablet 3  . butalbital-acetaminophen-caffeine (FIORICET) 50-325-40 MG tablet TAKE 1 TO 2 TABLETS BY MOUTH EVERY 4 HOURS AS NEEDED FOR HEADACHE (Patient  taking differently: Take 1-2 tablets by mouth every 4 (four) hours as needed for headache. ) 150 tablet 3  . calcium acetate (PHOSLO) 667 MG capsule Take 667 mg by mouth 3 (three) times daily with meals.    . cloNIDine (CATAPRES) 0.3 MG tablet TAKE 1 TABLET(0.3 MG) BY MOUTH EVERY 8 HOURS (Patient taking differently: Take 0.3 mg by mouth 3 (three) times daily. ) 270 tablet 4  . esomeprazole (NEXIUM) 20 MG capsule Take 20 mg by mouth daily.    . hydrALAZINE (APRESOLINE) 50 MG tablet Take 50 mg by mouth 3 (three) times daily.    [name] levofloxacin (LEVAQUIN) 500 MG tablet Take 1 tablet (500 mg total) by mouth daily. 7 tablet 0  . LORazepam (ATIVAN) 1  MG tablet TAKE 1 TABLET(1 MG) BY MOUTH TWICE DAILY AS NEEDED (Patient taking differently: Take 1 mg by mouth 2 (two) times daily. ) 180 tablet 1  . losartan (COZAAR) 100 MG tablet Take 100 mg by mouth at bedtime.    . meloxicam (MOBIC) 15 MG tablet Take 15 mg by mouth daily as needed for pain.    . metoprolol tartrate 75 MG TABS Take 75 mg by mouth 2 (two) times daily. 60 tablet 0  . multivitamin (RENA-VIT) TABS tablet Take 1 tablet by mouth daily.    [name] torsemide (DEMADEX) 100 MG tablet Take 100 mg by mouth 2 (two) times daily.     [name] zolpidem (AMBIEN) 10 MG tablet TAKE 1/2 TO 1 TABLET(5 TO 10 MG) BY MOUTH AT BEDTIME AS NEEDED (Patient taking differently: Take 10 mg by mouth at bedtime as needed for sleep. ) 30 tablet 5   No current facility-administered medications for this visit.   Allergies:  Cephalosporins, Clarithromycin, Lunesta  [eszopiclone], Penicillin v, and Sulfa antibiotics   Social History: The patient  reports that she has never smoked. She has never used smokeless tobacco. She reports current alcohol use. She reports that she does not use drugs.   Family History: The patient's family history includes Cancer in her paternal grandfather; Congestive Heart Failure in her maternal grandmother; Heart disease in her maternal uncle; Pancreatic cancer in her father.   ROS:  Please see the history of present illness. Otherwise, complete review of systems is positive for none.  All other systems are reviewed and negative.   Physical Exam: VS:  There were no vitals taken for this visit., BMI There is no height or weight on file to calculate BMI.  Wt Readings from Last 3 Encounters:  04/05/19 119 lb 11.4 oz (54.3 kg)  11/21/18 112 lb (50.8 kg)  11/16/18 118 lb (53.5 kg)    General: Patient appears comfortable at rest. HEENT: Conjunctiva and lids normal, oropharynx clear with moist mucosa. Neck: Supple, no elevated JVP or carotid bruits, no thyromegaly. Lungs: Clear to auscultation,  nonlabored breathing at rest. Cardiac: Regular rate and rhythm, no S3 or significant systolic murmur, no pericardial rub. Abdomen: Soft, nontender, no hepatomegaly, bowel sounds present, no guarding or rebound. Extremities: No pitting edema, distal pulses 2+. Skin: Warm and dry. Musculoskeletal: No kyphosis. Neuropsychiatric: Alert and oriented x3, affect grossly appropriate.  ECG:  An ECG dated 04/04/2019 was personally reviewed today and demonstrated:  atrial fibrillation rate of 135  Recent Labwork: 04/03/2019: B Natriuretic Peptide 536.0; TSH 1.277 04/04/2019: ALT 22; AST 20 04/06/2019: BUN 22; Creatinine, Ser 2.80; Hemoglobin 8.6; Magnesium 1.5; Platelets 283; Potassium 3.3; Sodium 136     Component Value Date/Time   CHOL 165 09/24/2016  0550   CHOL 195 12/11/2015 1002   TRIG 126 09/24/2016 0550   HDL 62 09/24/2016 0550   HDL 88 12/11/2015 1002   CHOLHDL 2.7 09/24/2016 0550   VLDL 25 09/24/2016 0550   LDLCALC 78 09/24/2016 0550   LDLCALC 75 12/11/2015 1002    Other Studies Reviewed Today:  Echo 04/04/2019 1. Left ventricular ejection fraction, by visual estimation, is 65 to 70%. The left ventricle has normal function. There is borderline left ventricular hypertrophy. 2. Left ventricular diastolic parameters are consistent with Grade II diastolic dysfunction (pseudonormalization). 3. Global right ventricle has normal systolic function.The right ventricular size is not assessed. No increase in right ventricular wall thickness. 4. Left atrial size was mildly dilated. 5. Right atrial size was normal. 6. Small pericardial effusion. 7. The pericardial effusion is circumferential. 8. The mitral valve is normal in structure. No evidence of mitral valve regurgitation. 9. The tricuspid valve is normal in structure. Tricuspid valve regurgitation is not demonstrated. 10. The aortic valve is normal in structure. Aortic valve regurgitation is not visualized. 11. The pulmonic valve was  not well visualized. Pulmonic valve regurgitation is not visualized. 12. Normal pulmonary artery systolic pressure. 13. The inferior vena cava is dilated in size with >50% respiratory variability, suggesting right atrial pressure of 8 mmHg.  Renal US2/20/2020 IMPRESSION: 1. Diffuse increased cortical echogenicity worrisome for medical renal disease. 2. Interval development of apparent caliectasis without pelviectasis or ureterectasis - this finding was confirmed with real-time sonographic evaluation performed by the dictating interventional radiologist and given patient's current urinary tract infection, the decision was made NOT to proceed with ultrasound-guided random renal biopsy at this time.  Assessment and Plan:  No diagnosis found.   Medication Adjustments/Labs and Tests Ordered: Current medicines are reviewed at length with the patient today.  Concerns regarding medicines are outlined above.    There are no Patient Instructions on file for this visit.       Signed, Levell July, NP 04/20/2019 12:43 PM    [facility] [place], [place], [place] [address] Phone: [phone]; Fax: [phone]

## 2019-04-21 DIAGNOSIS — D509 Iron deficiency anemia, unspecified: Secondary | ICD-10-CM | POA: Diagnosis not present

## 2019-04-21 DIAGNOSIS — N186 End stage renal disease: Secondary | ICD-10-CM | POA: Diagnosis not present

## 2019-04-21 DIAGNOSIS — Z992 Dependence on renal dialysis: Secondary | ICD-10-CM | POA: Diagnosis not present

## 2019-04-21 DIAGNOSIS — D631 Anemia in chronic kidney disease: Secondary | ICD-10-CM | POA: Diagnosis not present

## 2019-04-21 DIAGNOSIS — N2581 Secondary hyperparathyroidism of renal origin: Secondary | ICD-10-CM | POA: Diagnosis not present

## 2019-04-24 DIAGNOSIS — N2581 Secondary hyperparathyroidism of renal origin: Secondary | ICD-10-CM | POA: Diagnosis not present

## 2019-04-24 DIAGNOSIS — D509 Iron deficiency anemia, unspecified: Secondary | ICD-10-CM | POA: Diagnosis not present

## 2019-04-24 DIAGNOSIS — N186 End stage renal disease: Secondary | ICD-10-CM | POA: Diagnosis not present

## 2019-04-24 DIAGNOSIS — D631 Anemia in chronic kidney disease: Secondary | ICD-10-CM | POA: Diagnosis not present

## 2019-04-24 DIAGNOSIS — Z992 Dependence on renal dialysis: Secondary | ICD-10-CM | POA: Diagnosis not present

## 2019-04-24 NOTE — Telephone Encounter (Signed)
No show lmov to schedule

## 2019-04-26 DIAGNOSIS — N186 End stage renal disease: Secondary | ICD-10-CM | POA: Diagnosis not present

## 2019-04-26 DIAGNOSIS — D509 Iron deficiency anemia, unspecified: Secondary | ICD-10-CM | POA: Diagnosis not present

## 2019-04-26 DIAGNOSIS — Z992 Dependence on renal dialysis: Secondary | ICD-10-CM | POA: Diagnosis not present

## 2019-04-26 DIAGNOSIS — D631 Anemia in chronic kidney disease: Secondary | ICD-10-CM | POA: Diagnosis not present

## 2019-04-26 DIAGNOSIS — N2581 Secondary hyperparathyroidism of renal origin: Secondary | ICD-10-CM | POA: Diagnosis not present

## 2019-04-28 DIAGNOSIS — D631 Anemia in chronic kidney disease: Secondary | ICD-10-CM | POA: Diagnosis not present

## 2019-04-28 DIAGNOSIS — Z992 Dependence on renal dialysis: Secondary | ICD-10-CM | POA: Diagnosis not present

## 2019-04-28 DIAGNOSIS — N186 End stage renal disease: Secondary | ICD-10-CM | POA: Diagnosis not present

## 2019-04-28 DIAGNOSIS — D509 Iron deficiency anemia, unspecified: Secondary | ICD-10-CM | POA: Diagnosis not present

## 2019-04-28 DIAGNOSIS — N2581 Secondary hyperparathyroidism of renal origin: Secondary | ICD-10-CM | POA: Diagnosis not present

## 2019-05-01 DIAGNOSIS — N2581 Secondary hyperparathyroidism of renal origin: Secondary | ICD-10-CM | POA: Diagnosis not present

## 2019-05-01 DIAGNOSIS — Z992 Dependence on renal dialysis: Secondary | ICD-10-CM | POA: Diagnosis not present

## 2019-05-01 DIAGNOSIS — D631 Anemia in chronic kidney disease: Secondary | ICD-10-CM | POA: Diagnosis not present

## 2019-05-01 DIAGNOSIS — N186 End stage renal disease: Secondary | ICD-10-CM | POA: Diagnosis not present

## 2019-05-01 DIAGNOSIS — D509 Iron deficiency anemia, unspecified: Secondary | ICD-10-CM | POA: Diagnosis not present

## 2019-05-03 DIAGNOSIS — D509 Iron deficiency anemia, unspecified: Secondary | ICD-10-CM | POA: Diagnosis not present

## 2019-05-03 DIAGNOSIS — D631 Anemia in chronic kidney disease: Secondary | ICD-10-CM | POA: Diagnosis not present

## 2019-05-03 DIAGNOSIS — Z992 Dependence on renal dialysis: Secondary | ICD-10-CM | POA: Diagnosis not present

## 2019-05-03 DIAGNOSIS — N186 End stage renal disease: Secondary | ICD-10-CM | POA: Diagnosis not present

## 2019-05-03 DIAGNOSIS — N2581 Secondary hyperparathyroidism of renal origin: Secondary | ICD-10-CM | POA: Diagnosis not present

## 2019-05-03 NOTE — Telephone Encounter (Signed)
Lmov to schedule  

## 2019-05-05 DIAGNOSIS — D631 Anemia in chronic kidney disease: Secondary | ICD-10-CM | POA: Diagnosis not present

## 2019-05-05 DIAGNOSIS — N186 End stage renal disease: Secondary | ICD-10-CM | POA: Diagnosis not present

## 2019-05-05 DIAGNOSIS — N2581 Secondary hyperparathyroidism of renal origin: Secondary | ICD-10-CM | POA: Diagnosis not present

## 2019-05-05 DIAGNOSIS — Z992 Dependence on renal dialysis: Secondary | ICD-10-CM | POA: Diagnosis not present

## 2019-05-05 DIAGNOSIS — D509 Iron deficiency anemia, unspecified: Secondary | ICD-10-CM | POA: Diagnosis not present

## 2019-05-07 ENCOUNTER — Ambulatory Visit (INDEPENDENT_AMBULATORY_CARE_PROVIDER_SITE_OTHER): Payer: Medicare Other | Admitting: Family Medicine

## 2019-05-07 ENCOUNTER — Other Ambulatory Visit: Payer: Self-pay

## 2019-05-07 ENCOUNTER — Encounter: Payer: Self-pay | Admitting: Family Medicine

## 2019-05-07 DIAGNOSIS — D631 Anemia in chronic kidney disease: Secondary | ICD-10-CM | POA: Diagnosis not present

## 2019-05-07 DIAGNOSIS — N186 End stage renal disease: Secondary | ICD-10-CM | POA: Diagnosis not present

## 2019-05-07 DIAGNOSIS — J069 Acute upper respiratory infection, unspecified: Secondary | ICD-10-CM

## 2019-05-07 DIAGNOSIS — Z992 Dependence on renal dialysis: Secondary | ICD-10-CM

## 2019-05-07 DIAGNOSIS — I483 Typical atrial flutter: Secondary | ICD-10-CM | POA: Diagnosis not present

## 2019-05-07 MED ORDER — AMOXICILLIN-POT CLAVULANATE 500-125 MG PO TABS: 1.0000 | ORAL_TABLET | Freq: Every day | ORAL | 0 refills | Status: AC

## 2019-05-07 MED ORDER — APIXABAN 2.5 MG PO TABS: 2.5000 mg | ORAL_TABLET | Freq: Two times a day (BID) | ORAL | 1 refills | Status: DC

## 2019-05-07 NOTE — Progress Notes (Signed)
Patient: Amy [name] Female    DOB: 1951-04-26   69 y.o.   MRN: [id number] Visit Date: 05/07/2019  Today's Provider: Lelon Huh, MD   Chief Complaint  Patient presents with  . Hospitalization Follow-up   Subjective:    Virtual Visit via Telephone Note  I connected with [name] on 05/07/19 at 10:00 AM EST by telephone and verified that I am speaking with the correct person using two identifiers.  Location: Patient: home  Provider: bfp   I discussed the limitations, risks, security and privacy concerns of performing an evaluation and management service by telephone and the availability of in person appointments. I also discussed with the patient that there may be a patient responsible charge related to this service. The patient expressed understanding and agreed to proceed.  ------------------------------------------------------------------------------------     HPI  Follow up Hospitalization  Patient was admitted to [facility] on 04/03/2019 and discharged on 04/06/2019. She was treated for Fever, ESRD, AFIB, A-Flutter. Treatment for this included starting on Eliquis and metoprolol. She missed her cardiology follow but states she is expecting a call from their office to reschedule.  Telephone follow up was done on 04/09/2019 She reports good compliance with treatment. Patient is currently out of Eliquis.  She reports this condition is Improved. Patient states she still feels tired. She also reports thick productive cough, congestion and low grade fever as high as 100.0. She is now doing dialysis three times a week. She has long history of anemia of chronic kidney disease with low hemoglobin of 6.9 in the hospital. She received 1 unit PRBCs and remained above 8 the rest of her hospitalization.    Allergies  Allergen Reactions  . Cephalosporins   . Clarithromycin   . Lunesta  [Eszopiclone]     Heart racing  . Penicillin V     Has patient had a PCN reaction causing  immediate rash, facial/tongue/throat swelling, SOB or lightheadedness with hypotension: Yes Has patient had a PCN reaction causing severe rash involving mucus membranes or skin necrosis: No Has patient had a PCN reaction that required hospitalization No Has patient had a PCN reaction occurring within the last 10 years: No If all of the above answers are "NO", then may proceed with Cephalosporin use.  . Sulfa Antibiotics      Current Outpatient Medications:  .  amLODipine (NORVASC) 10 MG tablet, Take 10 mg by mouth daily., Disp: , Rfl:  .  apixaban (ELIQUIS) 2.5 MG TABS tablet, Take 1 tablet (2.5 mg total) by mouth 2 (two) times daily., Disp: 60 tablet, Rfl: 0 .  buPROPion (WELLBUTRIN) 75 MG tablet, TAKE 1 TABLET(75 MG) BY MOUTH TWICE DAILY (Patient taking differently: Take 75 mg by mouth 2 (two) times daily. ), Disp: 180 tablet, Rfl: 3 .  butalbital-acetaminophen-caffeine (FIORICET) 50-325-40 MG tablet, Take 1-2 tablets by mouth every 4 (four) hours as needed for headache., Disp: 150 tablet, Rfl: 3 .  calcium acetate (PHOSLO) 667 MG capsule, Take 667 mg by mouth 3 (three) times daily with meals., Disp: , Rfl:  .  cloNIDine (CATAPRES) 0.3 MG tablet, TAKE 1 TABLET(0.3 MG) BY MOUTH EVERY 8 HOURS (Patient taking differently: Take 0.3 mg by mouth 3 (three) times daily. ), Disp: 270 tablet, Rfl: 4 .  esomeprazole (NEXIUM) 20 MG capsule, Take 20 mg by mouth daily., Disp: , Rfl:  .  hydrALAZINE (APRESOLINE) 100 MG tablet, Take 100 mg by mouth 3 (three) times daily as needed. ,  Disp: , Rfl:  .  LORazepam (ATIVAN) 1 MG tablet, TAKE 1 TABLET(1 MG) BY MOUTH TWICE DAILY AS NEEDED (Patient taking differently: Take 1 mg by mouth 2 (two) times daily. ), Disp: 180 tablet, Rfl: 1 .  losartan (COZAAR) 100 MG tablet, Take 100 mg by mouth at bedtime., Disp: , Rfl:  .  metoprolol tartrate 75 MG TABS, Take 75 mg by mouth 2 (two) times daily., Disp: 60 tablet, Rfl: 0 .  multivitamin (RENA-VIT) TABS tablet, Take 1  tablet by mouth daily., Disp: , Rfl:  .  torsemide (DEMADEX) 20 MG tablet, , Disp: , Rfl:  .  zolpidem (AMBIEN) 10 MG tablet, TAKE 1/2 TO 1 TABLET(5 TO 10 MG) BY MOUTH AT BEDTIME AS NEEDED (Patient taking differently: Take 10 mg by mouth at bedtime as needed for sleep. ), Disp: 30 tablet, Rfl: 5 .  hydrALAZINE (APRESOLINE) 50 MG tablet, Take 1 tablet (50 mg total) by mouth 3 (three) times daily. Please make appt for further refills. (Patient not taking: Reported on 05/07/2019), Disp: 90 tablet, Rfl: 0 .  meloxicam (MOBIC) 15 MG tablet, Take 15 mg by mouth daily as needed for pain., Disp: , Rfl:   Review of Systems  Constitutional: Positive for fatigue and fever. Negative for appetite change and chills.  HENT: Positive for congestion.   Respiratory: Positive for cough. Negative for chest tightness and shortness of breath.   Cardiovascular: Negative for chest pain and palpitations.  Gastrointestinal: Negative for abdominal pain, nausea and vomiting.  Neurological: Negative for dizziness and weakness.    Social History   Tobacco Use  . Smoking status: Never Smoker  . Smokeless tobacco: Never Used  Substance Use Topics  . Alcohol use: Yes    Alcohol/week: 0.0 - 2.0 standard drinks      Objective:   There were no vitals taken for this visit. There were no vitals filed for this visit.There is no height or weight on file to calculate BMI.   Physical Exam  Awake, alert, oriented x 3. In no apparent distress       Assessment & Plan     1. Typical atrial flutter (HCC) continue- apixaban (ELIQUIS) 2.5 MG TABS tablet; Take 1 tablet (2.5 mg total) by mouth 2 (two) times daily.  Dispense: 60 tablet; Refill: 1 Currently asymptomatic and rated controlled at hospital discharge. She is expecting to hear from cardiology to schedule follow up and advised patient to call Dr. Donivan Scull office if she doesn't hear from them in the next week or two.   2. End stage renal disease ([place])   3. Anemia in  chronic kidney disease, on chronic dialysis [facility]) Previously followed by Dr. Grayland Ormond and advised to call to schedule follow up appoitnment  4. Upper respiratory tract infection, unspecified type  - amoxicillin-clavulanate (AUGMENTIN) 500-125 MG tablet; Take 1 tablet (500 mg total) by mouth daily for 14 days.  Dispense: 14 tablet; Refill: 0     I discussed the assessment and treatment plan with the patient. The patient was provided an opportunity to ask questions and all were answered. The patient agreed with the plan and demonstrated an understanding of the instructions.   The patient was advised to call back or seek an in-person evaluation if the symptoms worsen or if the condition fails to improve as anticipated.  I provided 12 minutes of non-face-to-face time during this encounter.    Lelon Huh, MD  [facility]

## 2019-05-08 DIAGNOSIS — N2581 Secondary hyperparathyroidism of renal origin: Secondary | ICD-10-CM | POA: Diagnosis not present

## 2019-05-08 DIAGNOSIS — D509 Iron deficiency anemia, unspecified: Secondary | ICD-10-CM | POA: Diagnosis not present

## 2019-05-08 DIAGNOSIS — D631 Anemia in chronic kidney disease: Secondary | ICD-10-CM | POA: Diagnosis not present

## 2019-05-08 DIAGNOSIS — N186 End stage renal disease: Secondary | ICD-10-CM | POA: Diagnosis not present

## 2019-05-08 DIAGNOSIS — Z992 Dependence on renal dialysis: Secondary | ICD-10-CM | POA: Diagnosis not present

## 2019-05-09 ENCOUNTER — Telehealth: Payer: Self-pay | Admitting: Cardiovascular Disease

## 2019-05-09 NOTE — Telephone Encounter (Signed)
Spoke with the patient. Patient sts that she went to pick up her Eliquis prescription today and it would cost her $450 for a month supply. Patient sts that she has 3 health insurance policies and she does not why her out of pocket cost is so high.  Advised her that since it is the first of the year she may need to meet a deductible and then once met her medication co-pay should go down. Advised her that she could also access a Eliquis co-pay card online and provide the codes to her pharmacy.  Patient does not think she will qualify for the discount co-pay card. She will call around to different pharmacies to check their pricing. If she cannot find Eliquis cheaper, she will contact her insurance company to get more details. Patient sts that she will call back if she needs a new Rx sent somewhere else.  Advised the patient that I will fwd the message to Dr. Donivan Scull nurse Jeannene Patella, RN to see if she had any additional recommendations.

## 2019-05-09 NOTE — Telephone Encounter (Signed)
Patient went to pick up Eliquis Rx and the pharmacy said if was $400. Patient calling in to see if we can help in any way.   Please advise when able

## 2019-05-10 DIAGNOSIS — N2581 Secondary hyperparathyroidism of renal origin: Secondary | ICD-10-CM | POA: Diagnosis not present

## 2019-05-10 DIAGNOSIS — D509 Iron deficiency anemia, unspecified: Secondary | ICD-10-CM | POA: Diagnosis not present

## 2019-05-10 DIAGNOSIS — N186 End stage renal disease: Secondary | ICD-10-CM | POA: Diagnosis not present

## 2019-05-10 DIAGNOSIS — Z992 Dependence on renal dialysis: Secondary | ICD-10-CM | POA: Diagnosis not present

## 2019-05-10 DIAGNOSIS — D631 Anemia in chronic kidney disease: Secondary | ICD-10-CM | POA: Diagnosis not present

## 2019-05-10 NOTE — Telephone Encounter (Signed)
Pt called in about medication and I remember we were trying to reach her for appointment. If you should reach her for appointment find me so I can talk with her as well about the medication

## 2019-05-12 DIAGNOSIS — N186 End stage renal disease: Secondary | ICD-10-CM | POA: Diagnosis not present

## 2019-05-12 DIAGNOSIS — D509 Iron deficiency anemia, unspecified: Secondary | ICD-10-CM | POA: Diagnosis not present

## 2019-05-12 DIAGNOSIS — Z992 Dependence on renal dialysis: Secondary | ICD-10-CM | POA: Diagnosis not present

## 2019-05-12 DIAGNOSIS — D631 Anemia in chronic kidney disease: Secondary | ICD-10-CM | POA: Diagnosis not present

## 2019-05-12 DIAGNOSIS — N2581 Secondary hyperparathyroidism of renal origin: Secondary | ICD-10-CM | POA: Diagnosis not present

## 2019-05-15 DIAGNOSIS — N186 End stage renal disease: Secondary | ICD-10-CM | POA: Diagnosis not present

## 2019-05-15 DIAGNOSIS — Z992 Dependence on renal dialysis: Secondary | ICD-10-CM | POA: Diagnosis not present

## 2019-05-15 DIAGNOSIS — D509 Iron deficiency anemia, unspecified: Secondary | ICD-10-CM | POA: Diagnosis not present

## 2019-05-15 DIAGNOSIS — N2581 Secondary hyperparathyroidism of renal origin: Secondary | ICD-10-CM | POA: Diagnosis not present

## 2019-05-15 DIAGNOSIS — D631 Anemia in chronic kidney disease: Secondary | ICD-10-CM | POA: Diagnosis not present

## 2019-05-17 DIAGNOSIS — D631 Anemia in chronic kidney disease: Secondary | ICD-10-CM | POA: Diagnosis not present

## 2019-05-17 DIAGNOSIS — Z992 Dependence on renal dialysis: Secondary | ICD-10-CM | POA: Diagnosis not present

## 2019-05-17 DIAGNOSIS — N2581 Secondary hyperparathyroidism of renal origin: Secondary | ICD-10-CM | POA: Diagnosis not present

## 2019-05-17 DIAGNOSIS — N186 End stage renal disease: Secondary | ICD-10-CM | POA: Diagnosis not present

## 2019-05-17 DIAGNOSIS — D509 Iron deficiency anemia, unspecified: Secondary | ICD-10-CM | POA: Diagnosis not present

## 2019-05-19 DIAGNOSIS — N186 End stage renal disease: Secondary | ICD-10-CM | POA: Diagnosis not present

## 2019-05-19 DIAGNOSIS — D631 Anemia in chronic kidney disease: Secondary | ICD-10-CM | POA: Diagnosis not present

## 2019-05-19 DIAGNOSIS — N2581 Secondary hyperparathyroidism of renal origin: Secondary | ICD-10-CM | POA: Diagnosis not present

## 2019-05-19 DIAGNOSIS — Z992 Dependence on renal dialysis: Secondary | ICD-10-CM | POA: Diagnosis not present

## 2019-05-19 DIAGNOSIS — D509 Iron deficiency anemia, unspecified: Secondary | ICD-10-CM | POA: Diagnosis not present

## 2019-05-22 DIAGNOSIS — D631 Anemia in chronic kidney disease: Secondary | ICD-10-CM | POA: Diagnosis not present

## 2019-05-22 DIAGNOSIS — Z992 Dependence on renal dialysis: Secondary | ICD-10-CM | POA: Diagnosis not present

## 2019-05-22 DIAGNOSIS — N2581 Secondary hyperparathyroidism of renal origin: Secondary | ICD-10-CM | POA: Diagnosis not present

## 2019-05-22 DIAGNOSIS — D509 Iron deficiency anemia, unspecified: Secondary | ICD-10-CM | POA: Diagnosis not present

## 2019-05-22 DIAGNOSIS — N186 End stage renal disease: Secondary | ICD-10-CM | POA: Diagnosis not present

## 2019-05-23 DIAGNOSIS — M170 Bilateral primary osteoarthritis of knee: Secondary | ICD-10-CM | POA: Diagnosis not present

## 2019-05-24 DIAGNOSIS — D509 Iron deficiency anemia, unspecified: Secondary | ICD-10-CM | POA: Diagnosis not present

## 2019-05-24 DIAGNOSIS — D631 Anemia in chronic kidney disease: Secondary | ICD-10-CM | POA: Diagnosis not present

## 2019-05-24 DIAGNOSIS — Z20822 Contact with and (suspected) exposure to covid-19: Secondary | ICD-10-CM | POA: Diagnosis not present

## 2019-05-24 DIAGNOSIS — Z01812 Encounter for preprocedural laboratory examination: Secondary | ICD-10-CM | POA: Diagnosis not present

## 2019-05-24 DIAGNOSIS — Z992 Dependence on renal dialysis: Secondary | ICD-10-CM | POA: Diagnosis not present

## 2019-05-24 DIAGNOSIS — N186 End stage renal disease: Secondary | ICD-10-CM | POA: Diagnosis not present

## 2019-05-24 DIAGNOSIS — N2581 Secondary hyperparathyroidism of renal origin: Secondary | ICD-10-CM | POA: Diagnosis not present

## 2019-05-26 DIAGNOSIS — N2581 Secondary hyperparathyroidism of renal origin: Secondary | ICD-10-CM | POA: Diagnosis not present

## 2019-05-26 DIAGNOSIS — D509 Iron deficiency anemia, unspecified: Secondary | ICD-10-CM | POA: Diagnosis not present

## 2019-05-26 DIAGNOSIS — D631 Anemia in chronic kidney disease: Secondary | ICD-10-CM | POA: Diagnosis not present

## 2019-05-26 DIAGNOSIS — Z992 Dependence on renal dialysis: Secondary | ICD-10-CM | POA: Diagnosis not present

## 2019-05-26 DIAGNOSIS — N186 End stage renal disease: Secondary | ICD-10-CM | POA: Diagnosis not present

## 2019-05-28 DIAGNOSIS — Z992 Dependence on renal dialysis: Secondary | ICD-10-CM | POA: Diagnosis not present

## 2019-05-28 DIAGNOSIS — N2581 Secondary hyperparathyroidism of renal origin: Secondary | ICD-10-CM | POA: Diagnosis not present

## 2019-05-28 DIAGNOSIS — N186 End stage renal disease: Secondary | ICD-10-CM | POA: Diagnosis not present

## 2019-05-28 DIAGNOSIS — D509 Iron deficiency anemia, unspecified: Secondary | ICD-10-CM | POA: Diagnosis not present

## 2019-05-28 DIAGNOSIS — D631 Anemia in chronic kidney disease: Secondary | ICD-10-CM | POA: Diagnosis not present

## 2019-05-29 DIAGNOSIS — Z9889 Other specified postprocedural states: Secondary | ICD-10-CM | POA: Diagnosis not present

## 2019-05-29 DIAGNOSIS — I120 Hypertensive chronic kidney disease with stage 5 chronic kidney disease or end stage renal disease: Secondary | ICD-10-CM | POA: Diagnosis not present

## 2019-05-29 DIAGNOSIS — Z992 Dependence on renal dialysis: Secondary | ICD-10-CM | POA: Diagnosis not present

## 2019-05-29 DIAGNOSIS — N186 End stage renal disease: Secondary | ICD-10-CM | POA: Diagnosis not present

## 2019-05-30 NOTE — Telephone Encounter (Signed)
Contacted patient to schedule f/u appointment States she will look at her calendar and call back

## 2019-05-31 DIAGNOSIS — N186 End stage renal disease: Secondary | ICD-10-CM | POA: Diagnosis not present

## 2019-05-31 DIAGNOSIS — D631 Anemia in chronic kidney disease: Secondary | ICD-10-CM | POA: Diagnosis not present

## 2019-05-31 DIAGNOSIS — D509 Iron deficiency anemia, unspecified: Secondary | ICD-10-CM | POA: Diagnosis not present

## 2019-05-31 DIAGNOSIS — Z992 Dependence on renal dialysis: Secondary | ICD-10-CM | POA: Diagnosis not present

## 2019-05-31 DIAGNOSIS — N2581 Secondary hyperparathyroidism of renal origin: Secondary | ICD-10-CM | POA: Diagnosis not present

## 2019-06-02 DIAGNOSIS — D509 Iron deficiency anemia, unspecified: Secondary | ICD-10-CM | POA: Diagnosis not present

## 2019-06-02 DIAGNOSIS — N186 End stage renal disease: Secondary | ICD-10-CM | POA: Diagnosis not present

## 2019-06-02 DIAGNOSIS — Z992 Dependence on renal dialysis: Secondary | ICD-10-CM | POA: Diagnosis not present

## 2019-06-02 DIAGNOSIS — N2581 Secondary hyperparathyroidism of renal origin: Secondary | ICD-10-CM | POA: Diagnosis not present

## 2019-06-02 DIAGNOSIS — D631 Anemia in chronic kidney disease: Secondary | ICD-10-CM | POA: Diagnosis not present

## 2019-06-03 DIAGNOSIS — Z992 Dependence on renal dialysis: Secondary | ICD-10-CM | POA: Diagnosis not present

## 2019-06-03 DIAGNOSIS — N186 End stage renal disease: Secondary | ICD-10-CM | POA: Diagnosis not present

## 2019-06-05 DIAGNOSIS — D509 Iron deficiency anemia, unspecified: Secondary | ICD-10-CM | POA: Diagnosis not present

## 2019-06-05 DIAGNOSIS — N186 End stage renal disease: Secondary | ICD-10-CM | POA: Diagnosis not present

## 2019-06-05 DIAGNOSIS — N2581 Secondary hyperparathyroidism of renal origin: Secondary | ICD-10-CM | POA: Diagnosis not present

## 2019-06-05 DIAGNOSIS — D631 Anemia in chronic kidney disease: Secondary | ICD-10-CM | POA: Diagnosis not present

## 2019-06-05 DIAGNOSIS — Z992 Dependence on renal dialysis: Secondary | ICD-10-CM | POA: Diagnosis not present

## 2019-06-07 DIAGNOSIS — N186 End stage renal disease: Secondary | ICD-10-CM | POA: Diagnosis not present

## 2019-06-07 DIAGNOSIS — D631 Anemia in chronic kidney disease: Secondary | ICD-10-CM | POA: Diagnosis not present

## 2019-06-07 DIAGNOSIS — Z992 Dependence on renal dialysis: Secondary | ICD-10-CM | POA: Diagnosis not present

## 2019-06-07 DIAGNOSIS — D509 Iron deficiency anemia, unspecified: Secondary | ICD-10-CM | POA: Diagnosis not present

## 2019-06-07 DIAGNOSIS — N2581 Secondary hyperparathyroidism of renal origin: Secondary | ICD-10-CM | POA: Diagnosis not present

## 2019-06-09 DIAGNOSIS — N186 End stage renal disease: Secondary | ICD-10-CM | POA: Diagnosis not present

## 2019-06-09 DIAGNOSIS — N2581 Secondary hyperparathyroidism of renal origin: Secondary | ICD-10-CM | POA: Diagnosis not present

## 2019-06-09 DIAGNOSIS — D509 Iron deficiency anemia, unspecified: Secondary | ICD-10-CM | POA: Diagnosis not present

## 2019-06-09 DIAGNOSIS — D631 Anemia in chronic kidney disease: Secondary | ICD-10-CM | POA: Diagnosis not present

## 2019-06-09 DIAGNOSIS — Z992 Dependence on renal dialysis: Secondary | ICD-10-CM | POA: Diagnosis not present

## 2019-06-12 DIAGNOSIS — D631 Anemia in chronic kidney disease: Secondary | ICD-10-CM | POA: Diagnosis not present

## 2019-06-12 DIAGNOSIS — D509 Iron deficiency anemia, unspecified: Secondary | ICD-10-CM | POA: Diagnosis not present

## 2019-06-12 DIAGNOSIS — Z992 Dependence on renal dialysis: Secondary | ICD-10-CM | POA: Diagnosis not present

## 2019-06-12 DIAGNOSIS — N186 End stage renal disease: Secondary | ICD-10-CM | POA: Diagnosis not present

## 2019-06-12 DIAGNOSIS — N2581 Secondary hyperparathyroidism of renal origin: Secondary | ICD-10-CM | POA: Diagnosis not present

## 2019-06-14 DIAGNOSIS — D509 Iron deficiency anemia, unspecified: Secondary | ICD-10-CM | POA: Diagnosis not present

## 2019-06-14 DIAGNOSIS — N186 End stage renal disease: Secondary | ICD-10-CM | POA: Diagnosis not present

## 2019-06-14 DIAGNOSIS — Z992 Dependence on renal dialysis: Secondary | ICD-10-CM | POA: Diagnosis not present

## 2019-06-14 DIAGNOSIS — N2581 Secondary hyperparathyroidism of renal origin: Secondary | ICD-10-CM | POA: Diagnosis not present

## 2019-06-14 DIAGNOSIS — D631 Anemia in chronic kidney disease: Secondary | ICD-10-CM | POA: Diagnosis not present

## 2019-06-15 DIAGNOSIS — Z992 Dependence on renal dialysis: Secondary | ICD-10-CM | POA: Diagnosis not present

## 2019-06-15 DIAGNOSIS — Z23 Encounter for immunization: Secondary | ICD-10-CM | POA: Diagnosis not present

## 2019-06-15 DIAGNOSIS — N186 End stage renal disease: Secondary | ICD-10-CM | POA: Diagnosis not present

## 2019-06-15 NOTE — Telephone Encounter (Signed)
3 attempts to schedule fu appt from recall list.   Deleting recall.   

## 2019-06-16 DIAGNOSIS — N186 End stage renal disease: Secondary | ICD-10-CM | POA: Diagnosis not present

## 2019-06-16 DIAGNOSIS — N2581 Secondary hyperparathyroidism of renal origin: Secondary | ICD-10-CM | POA: Diagnosis not present

## 2019-06-16 DIAGNOSIS — Z992 Dependence on renal dialysis: Secondary | ICD-10-CM | POA: Diagnosis not present

## 2019-06-16 DIAGNOSIS — D631 Anemia in chronic kidney disease: Secondary | ICD-10-CM | POA: Diagnosis not present

## 2019-06-16 DIAGNOSIS — D509 Iron deficiency anemia, unspecified: Secondary | ICD-10-CM | POA: Diagnosis not present

## 2019-06-19 DIAGNOSIS — D509 Iron deficiency anemia, unspecified: Secondary | ICD-10-CM | POA: Diagnosis not present

## 2019-06-19 DIAGNOSIS — D631 Anemia in chronic kidney disease: Secondary | ICD-10-CM | POA: Diagnosis not present

## 2019-06-19 DIAGNOSIS — N2581 Secondary hyperparathyroidism of renal origin: Secondary | ICD-10-CM | POA: Diagnosis not present

## 2019-06-19 DIAGNOSIS — Z992 Dependence on renal dialysis: Secondary | ICD-10-CM | POA: Diagnosis not present

## 2019-06-19 DIAGNOSIS — N186 End stage renal disease: Secondary | ICD-10-CM | POA: Diagnosis not present

## 2019-06-20 DIAGNOSIS — N186 End stage renal disease: Secondary | ICD-10-CM | POA: Diagnosis not present

## 2019-06-20 DIAGNOSIS — D631 Anemia in chronic kidney disease: Secondary | ICD-10-CM | POA: Diagnosis not present

## 2019-06-20 DIAGNOSIS — D509 Iron deficiency anemia, unspecified: Secondary | ICD-10-CM | POA: Diagnosis not present

## 2019-06-20 DIAGNOSIS — N2581 Secondary hyperparathyroidism of renal origin: Secondary | ICD-10-CM | POA: Diagnosis not present

## 2019-06-20 DIAGNOSIS — Z992 Dependence on renal dialysis: Secondary | ICD-10-CM | POA: Diagnosis not present

## 2019-06-23 DIAGNOSIS — Z992 Dependence on renal dialysis: Secondary | ICD-10-CM | POA: Diagnosis not present

## 2019-06-23 DIAGNOSIS — D509 Iron deficiency anemia, unspecified: Secondary | ICD-10-CM | POA: Diagnosis not present

## 2019-06-23 DIAGNOSIS — N186 End stage renal disease: Secondary | ICD-10-CM | POA: Diagnosis not present

## 2019-06-23 DIAGNOSIS — D631 Anemia in chronic kidney disease: Secondary | ICD-10-CM | POA: Diagnosis not present

## 2019-06-23 DIAGNOSIS — N2581 Secondary hyperparathyroidism of renal origin: Secondary | ICD-10-CM | POA: Diagnosis not present

## 2019-06-25 DIAGNOSIS — N186 End stage renal disease: Secondary | ICD-10-CM | POA: Diagnosis not present

## 2019-06-25 DIAGNOSIS — D631 Anemia in chronic kidney disease: Secondary | ICD-10-CM | POA: Diagnosis not present

## 2019-06-25 DIAGNOSIS — Z992 Dependence on renal dialysis: Secondary | ICD-10-CM | POA: Diagnosis not present

## 2019-06-25 DIAGNOSIS — N2581 Secondary hyperparathyroidism of renal origin: Secondary | ICD-10-CM | POA: Diagnosis not present

## 2019-06-26 DIAGNOSIS — Z1159 Encounter for screening for other viral diseases: Secondary | ICD-10-CM | POA: Diagnosis not present

## 2019-06-26 DIAGNOSIS — Z992 Dependence on renal dialysis: Secondary | ICD-10-CM | POA: Diagnosis not present

## 2019-06-26 DIAGNOSIS — N186 End stage renal disease: Secondary | ICD-10-CM | POA: Diagnosis not present

## 2019-06-26 DIAGNOSIS — N2581 Secondary hyperparathyroidism of renal origin: Secondary | ICD-10-CM | POA: Diagnosis not present

## 2019-06-26 DIAGNOSIS — D631 Anemia in chronic kidney disease: Secondary | ICD-10-CM | POA: Diagnosis not present

## 2019-06-27 DIAGNOSIS — Z992 Dependence on renal dialysis: Secondary | ICD-10-CM | POA: Diagnosis not present

## 2019-06-27 DIAGNOSIS — N186 End stage renal disease: Secondary | ICD-10-CM | POA: Diagnosis not present

## 2019-06-28 DIAGNOSIS — N186 End stage renal disease: Secondary | ICD-10-CM | POA: Diagnosis not present

## 2019-06-28 DIAGNOSIS — Z992 Dependence on renal dialysis: Secondary | ICD-10-CM | POA: Diagnosis not present

## 2019-06-29 DIAGNOSIS — N186 End stage renal disease: Secondary | ICD-10-CM | POA: Diagnosis not present

## 2019-06-29 DIAGNOSIS — Z992 Dependence on renal dialysis: Secondary | ICD-10-CM | POA: Diagnosis not present

## 2019-07-01 DIAGNOSIS — N186 End stage renal disease: Secondary | ICD-10-CM | POA: Diagnosis not present

## 2019-07-01 DIAGNOSIS — Z992 Dependence on renal dialysis: Secondary | ICD-10-CM | POA: Diagnosis not present

## 2019-07-02 DIAGNOSIS — Z992 Dependence on renal dialysis: Secondary | ICD-10-CM | POA: Diagnosis not present

## 2019-07-02 DIAGNOSIS — D509 Iron deficiency anemia, unspecified: Secondary | ICD-10-CM | POA: Diagnosis not present

## 2019-07-02 DIAGNOSIS — N186 End stage renal disease: Secondary | ICD-10-CM | POA: Diagnosis not present

## 2019-07-03 ENCOUNTER — Other Ambulatory Visit: Payer: Self-pay | Admitting: Family Medicine

## 2019-07-03 DIAGNOSIS — R519 Headache, unspecified: Secondary | ICD-10-CM

## 2019-07-03 NOTE — Telephone Encounter (Signed)
Requested medication (s) are due for refill today: yes  Requested medication (s) are on the active medication list: yes  Last refill:  06/18/19  Future visit scheduled: no  Notes to clinic:  not delegated    Requested Prescriptions  Pending Prescriptions Disp Refills   butalbital-acetaminophen-caffeine (FIORICET) 50-325-40 MG tablet [Pharmacy Med Name: BUTALBITAL/ACETAMINOPHEN/CAFF TABS] 150 tablet     Sig: TAKE 1 TO 2 TABLETS BY MOUTH EVERY 4 HOURS AS NEEDED FOR HEADACHE      Not Delegated - Analgesics:  Non-Opioid Analgesic Combinations Failed - 07/03/2019 12:04 PM      Failed - This refill cannot be delegated      Passed - Valid encounter within last 12 months    Recent Outpatient Visits           1 month ago Typical atrial flutter [facility])   [facility] [name], MD   7 months ago CKD (chronic kidney disease) stage 5, GFR less than 15 ml/min [facility])   [facility] [place], Dionne Bucy, MD   8 months ago Cellulitis of lower extremity, unspecified laterality   [facility] [name], MD   8 months ago Cellulitis of lower extremity, unspecified laterality   [facility] Fenton Malling M, [place]   9 months ago Leg swelling   [facility] [name], MD

## 2019-07-04 DIAGNOSIS — Z992 Dependence on renal dialysis: Secondary | ICD-10-CM | POA: Diagnosis not present

## 2019-07-04 DIAGNOSIS — D509 Iron deficiency anemia, unspecified: Secondary | ICD-10-CM | POA: Diagnosis not present

## 2019-07-04 DIAGNOSIS — N186 End stage renal disease: Secondary | ICD-10-CM | POA: Diagnosis not present

## 2019-07-05 ENCOUNTER — Other Ambulatory Visit: Payer: Self-pay | Admitting: Family Medicine

## 2019-07-05 DIAGNOSIS — D509 Iron deficiency anemia, unspecified: Secondary | ICD-10-CM | POA: Diagnosis not present

## 2019-07-05 DIAGNOSIS — N186 End stage renal disease: Secondary | ICD-10-CM | POA: Diagnosis not present

## 2019-07-05 DIAGNOSIS — Z992 Dependence on renal dialysis: Secondary | ICD-10-CM | POA: Diagnosis not present

## 2019-07-06 DIAGNOSIS — Z992 Dependence on renal dialysis: Secondary | ICD-10-CM | POA: Diagnosis not present

## 2019-07-06 DIAGNOSIS — N186 End stage renal disease: Secondary | ICD-10-CM | POA: Diagnosis not present

## 2019-07-06 DIAGNOSIS — D509 Iron deficiency anemia, unspecified: Secondary | ICD-10-CM | POA: Diagnosis not present

## 2019-07-09 DIAGNOSIS — Z992 Dependence on renal dialysis: Secondary | ICD-10-CM | POA: Diagnosis not present

## 2019-07-09 DIAGNOSIS — D509 Iron deficiency anemia, unspecified: Secondary | ICD-10-CM | POA: Diagnosis not present

## 2019-07-09 DIAGNOSIS — N186 End stage renal disease: Secondary | ICD-10-CM | POA: Diagnosis not present

## 2019-07-10 DIAGNOSIS — Z992 Dependence on renal dialysis: Secondary | ICD-10-CM | POA: Diagnosis not present

## 2019-07-10 DIAGNOSIS — N186 End stage renal disease: Secondary | ICD-10-CM | POA: Diagnosis not present

## 2019-07-10 DIAGNOSIS — D509 Iron deficiency anemia, unspecified: Secondary | ICD-10-CM | POA: Diagnosis not present

## 2019-07-11 DIAGNOSIS — N186 End stage renal disease: Secondary | ICD-10-CM | POA: Diagnosis not present

## 2019-07-11 DIAGNOSIS — Z992 Dependence on renal dialysis: Secondary | ICD-10-CM | POA: Diagnosis not present

## 2019-07-11 DIAGNOSIS — N2581 Secondary hyperparathyroidism of renal origin: Secondary | ICD-10-CM | POA: Diagnosis not present

## 2019-07-12 DIAGNOSIS — N2581 Secondary hyperparathyroidism of renal origin: Secondary | ICD-10-CM | POA: Diagnosis not present

## 2019-07-12 DIAGNOSIS — N186 End stage renal disease: Secondary | ICD-10-CM | POA: Diagnosis not present

## 2019-07-12 DIAGNOSIS — Z992 Dependence on renal dialysis: Secondary | ICD-10-CM | POA: Diagnosis not present

## 2019-07-13 DIAGNOSIS — Z992 Dependence on renal dialysis: Secondary | ICD-10-CM | POA: Diagnosis not present

## 2019-07-13 DIAGNOSIS — N2581 Secondary hyperparathyroidism of renal origin: Secondary | ICD-10-CM | POA: Diagnosis not present

## 2019-07-13 DIAGNOSIS — N186 End stage renal disease: Secondary | ICD-10-CM | POA: Diagnosis not present

## 2019-07-14 DIAGNOSIS — Z992 Dependence on renal dialysis: Secondary | ICD-10-CM | POA: Diagnosis not present

## 2019-07-14 DIAGNOSIS — N2581 Secondary hyperparathyroidism of renal origin: Secondary | ICD-10-CM | POA: Diagnosis not present

## 2019-07-14 DIAGNOSIS — N186 End stage renal disease: Secondary | ICD-10-CM | POA: Diagnosis not present

## 2019-07-15 DIAGNOSIS — Z992 Dependence on renal dialysis: Secondary | ICD-10-CM | POA: Diagnosis not present

## 2019-07-15 DIAGNOSIS — N186 End stage renal disease: Secondary | ICD-10-CM | POA: Diagnosis not present

## 2019-07-15 DIAGNOSIS — N2581 Secondary hyperparathyroidism of renal origin: Secondary | ICD-10-CM | POA: Diagnosis not present

## 2019-07-16 DIAGNOSIS — N186 End stage renal disease: Secondary | ICD-10-CM | POA: Diagnosis not present

## 2019-07-16 DIAGNOSIS — N2581 Secondary hyperparathyroidism of renal origin: Secondary | ICD-10-CM | POA: Diagnosis not present

## 2019-07-16 DIAGNOSIS — Z992 Dependence on renal dialysis: Secondary | ICD-10-CM | POA: Diagnosis not present

## 2019-07-16 DIAGNOSIS — Z23 Encounter for immunization: Secondary | ICD-10-CM | POA: Diagnosis not present

## 2019-07-17 DIAGNOSIS — N186 End stage renal disease: Secondary | ICD-10-CM | POA: Diagnosis not present

## 2019-07-17 DIAGNOSIS — N2581 Secondary hyperparathyroidism of renal origin: Secondary | ICD-10-CM | POA: Diagnosis not present

## 2019-07-17 DIAGNOSIS — Z992 Dependence on renal dialysis: Secondary | ICD-10-CM | POA: Diagnosis not present

## 2019-07-18 DIAGNOSIS — N186 End stage renal disease: Secondary | ICD-10-CM | POA: Diagnosis not present

## 2019-07-18 DIAGNOSIS — N2581 Secondary hyperparathyroidism of renal origin: Secondary | ICD-10-CM | POA: Diagnosis not present

## 2019-07-18 DIAGNOSIS — Z992 Dependence on renal dialysis: Secondary | ICD-10-CM | POA: Diagnosis not present

## 2019-07-18 NOTE — Progress Notes (Signed)
Subjective:   Amy [name] is a 69 y.o. female who presents for Medicare Annual (Subsequent) preventive examination.    This visit is being conducted through telemedicine due to the COVID-19 pandemic. This patient has given me verbal consent via doximity to conduct this visit, patient states they are participating from their home address. Some vital signs may be absent or patient reported.    Patient identification: identified by name, DOB, and current address  Review of Systems:  N/A  Cardiac Risk Factors include: advanced age (>57men, >54 women);hypertension     Objective:     Vitals: There were no vitals taken for this visit.  There is no height or weight on file to calculate BMI. Unable to obtain vitals due to visit being conducted via telephonically.   Advanced Directives 07/19/2019 04/04/2019 04/03/2019 11/21/2018 09/11/2018 06/19/2018 05/29/2018  Does Patient Have a Medical Advance Directive? Yes No No No Yes Yes Yes  Type of Paramedic of [place];Living will - - - Living will;Healthcare Power of [place];Living will [place];Living will  Does patient want to make changes to medical advance directive? - - - - - No - Patient declined No - Patient declined  Copy of [place] in Chart? No - copy requested - - - No - copy requested No - copy requested Yes - validated most recent copy scanned in chart (See row information)  Would patient like information on creating a medical advance directive? - No - Patient declined No - Patient declined No - Patient declined - - -    Tobacco Social History   Tobacco Use  Smoking Status Never Smoker  Smokeless Tobacco Never Used     Counseling given: Not Answered   Clinical Intake:  Pre-visit preparation completed: Yes  Pain : No/denies pain Pain Score: 0-No pain     Nutritional Risks: None Diabetes: No  How often do you need to have  someone help you when you read instructions, pamphlets, or other written materials from your doctor or pharmacy?: 1 - Never  Interpreter Needed?: No  Information entered by :: [facility], LPN  Past Medical History:  Diagnosis Date  . Anemia    a. 08/2016 Guaiac + stool. EGD w/ gastritis.  . Anxiety   . CKD (chronic kidney disease), stage III   . Colon polyp    a. 08/2016 Colonoscopy.  . Gastritis    a. 08/2016 EGD: Gastritis-->PPI.  [name] GERD (gastroesophageal reflux disease)   . History of chicken pox   . History of measles as a child   . Hypertension   . Hyponatremia    a. 08/2016 in setting of HCTZ Rx.  . Multiple thyroid nodules    Past Surgical History:  Procedure Laterality Date  . ABDOMINAL HYSTERECTOMY  2003   due to heavy periods and clotting during menses  . BREAST LUMPECTOMY Left 1990  . CHOLECYSTECTOMY  2010  . COLONOSCOPY WITH PROPOFOL N/A 09/07/2016   Procedure: COLONOSCOPY WITH PROPOFOL;  Surgeon: Lucilla Lame, MD;  Location: [facility] ENDOSCOPY;  Service: Endoscopy;  Laterality: N/A;  . DIALYSIS/PERMA CATHETER INSERTION N/A 11/16/2018   Procedure: DIALYSIS/PERMA CATHETER INSERTION;  Surgeon: Algernon Huxley, MD;  Location: [place] CV LAB;  Service: Cardiovascular;  Laterality: N/A;  . DILATION AND CURETTAGE OF UTERUS  1985  . ESOPHAGOGASTRODUODENOSCOPY (EGD) WITH PROPOFOL N/A 09/07/2016   Procedure: ESOPHAGOGASTRODUODENOSCOPY (EGD) WITH PROPOFOL;  Surgeon: Lucilla Lame, MD;  Location: [facility] ENDOSCOPY;  Service: Endoscopy;  Laterality: N/A;  . GALLBLADDER SURGERY  2012  . KNEE SURGERY     Dr. Tamala Julian  . MRI, Lumbar spine  07/23/2009   Multilevel annular bulge and disc protrusion at L2-L3, L3-L4, L4-L5, and L5-S1  . TONSILLECTOMY    . TONSILLECTOMY AND ADENOIDECTOMY  1970  . UPPER GI ENDOSCOPY  03/02/2006   H. Pylori negative; Dr. Allen Norris   Family History  Problem Relation Age of Onset  . Pancreatic cancer Father   . Congestive Heart Failure Maternal Grandmother   . Cancer  Paternal Grandfather   . Heart disease Maternal Uncle    Social History   Socioeconomic History  . Marital status: Married    Spouse name: Not on file  . Number of children: 1  . Years of education: Not on file  . Highest education level: Bachelor's degree (e.g., BA, AB, BS)  Occupational History  . Occupation: Retired  Tobacco Use  . Smoking status: Never Smoker  . Smokeless tobacco: Never Used  Substance and Sexual Activity  . Alcohol use: Yes    Alcohol/week: 0.0 - 2.0 standard drinks  . Drug use: No  . Sexual activity: Yes  Other Topics Concern  . Not on file  Social History Narrative   Lives in [place] with husband.  Does not routinely exercise.   Social Determinants of Health   Financial Resource Strain: Low Risk   . Difficulty of Paying Living Expenses: Not hard at all  Food Insecurity: No Food Insecurity  . Worried About Charity fundraiser in the Last Year: Never true  . Ran Out of Food in the Last Year: Never true  Transportation Needs: No Transportation Needs  . Lack of Transportation (Medical): No  . Lack of Transportation (Non-Medical): No  Physical Activity: Inactive  . Days of Exercise per Week: 0 days  . Minutes of Exercise per Session: 0 min  Stress: No Stress Concern Present  . Feeling of Stress : Not at all  Social Connections: Slightly Isolated  . Frequency of Communication with Friends and Family: More than three times a week  . Frequency of Social Gatherings with Friends and Family: Three times a week  . Attends Religious Services: More than 4 times per year  . Active Member of Clubs or Organizations: No  . Attends Archivist Meetings: Never  . Marital Status: Married    Outpatient Encounter Medications as of 07/19/2019  Medication Sig  . amLODipine (NORVASC) 10 MG tablet Take 10 mg by mouth daily.  [name] apixaban (ELIQUIS) 2.5 MG TABS tablet Take 1 tablet (2.5 mg total) by mouth 2 (two) times daily.  [name] buPROPion (WELLBUTRIN) 75 MG  tablet TAKE 1 TABLET(75 MG) BY MOUTH TWICE DAILY (Patient taking differently: Take 75 mg by mouth 2 (two) times daily. )  . butalbital-acetaminophen-caffeine (FIORICET) 50-325-40 MG tablet TAKE 1 TO 2 TABLETS BY MOUTH EVERY 4 HOURS AS NEEDED FOR HEADACHE  . calcium acetate (PHOSLO) 667 MG capsule Take 667 mg by mouth 3 (three) times daily with meals.  . cloNIDine (CATAPRES) 0.3 MG tablet TAKE 1 TABLET(0.3 MG) BY MOUTH EVERY 8 HOURS  . esomeprazole (NEXIUM) 20 MG capsule Take 20 mg by mouth daily.  . hydrALAZINE (APRESOLINE) 100 MG tablet Take 100 mg by mouth 3 (three) times daily as needed.   [name] LORazepam (ATIVAN) 1 MG tablet TAKE 1 TABLET(1 MG) BY MOUTH TWICE DAILY AS NEEDED (Patient taking differently: Take 1 mg by mouth 2 (two) times daily. )  .  losartan (COZAAR) 100 MG tablet Take 100 mg by mouth at bedtime.  . metoprolol tartrate 75 MG TABS Take 75 mg by mouth 2 (two) times daily.  . multivitamin (RENA-VIT) TABS tablet Take 1 tablet by mouth daily.  [name] torsemide (DEMADEX) 20 MG tablet Take 20 mg by mouth daily.   [name] zolpidem (AMBIEN) 10 MG tablet TAKE 1/2 TO 1 TABLET(5 TO 10 MG) BY MOUTH AT BEDTIME AS NEEDED (Patient taking differently: Take 10 mg by mouth at bedtime as needed for sleep. )  . hydrALAZINE (APRESOLINE) 50 MG tablet Take 1 tablet (50 mg total) by mouth 3 (three) times daily. Please make appt for further refills. (Patient not taking: Reported on 05/07/2019)  . meloxicam (MOBIC) 15 MG tablet Take 15 mg by mouth daily as needed for pain.   No facility-administered encounter medications on file as of 07/19/2019.    Activities of Daily Living In your present state of health, do you have any difficulty performing the following activities: 07/19/2019 04/06/2019  Hearing? N -  Vision? N -  Difficulty concentrating or making decisions? N -  Walking or climbing stairs? N -  Dressing or bathing? N -  Doing errands, shopping? N N  Preparing Food and eating ? N -  Using the Toilet? N -  In  the past six months, have you accidently leaked urine? N -  Do you have problems with loss of bowel control? N -  Managing your Medications? N -  Managing your Finances? N -  Housekeeping or managing your Housekeeping? N -  Some recent data might be hidden    Patient Care Team: [name], MD as PCP - General (Family Medicine) [place], MD as Referring Physician (Orthopedic Surgery) Murlean Iba, MD as Referring Physician (Nephrology)    Assessment:   This is a routine wellness examination for [place].  Exercise Activities and Dietary recommendations Current Exercise Habits: The patient does not participate in regular exercise at present, Exercise limited by: None identified  Goals    . Exercise 3x per week (30 min per time)     Recommend once healing from knee surgery to start walking 3 days a week for at least 30 minutes at a time.     [name] LIFESTYLE - DECREASE FALLS RISK     Recommend to remove any items from the home that may cause slips or trips.       Fall Risk: Fall Risk  07/19/2019 12/12/2017 05/10/2017 12/10/2016 12/11/2015  Falls in the past year? 1 No No No No  Number falls in past yr: 0 - - - -  Injury with Fall? 1 - - - -  Follow up Falls prevention discussed - - - -    FALL RISK PREVENTION PERTAINING TO THE HOME:  Any stairs in or around the home? Yes  If so, are there any without handrails? No   Home free of loose throw rugs in walkways, pet beds, electrical cords, etc? Yes  Adequate lighting in your home to reduce risk of falls? Yes   ASSISTIVE DEVICES UTILIZED TO PREVENT FALLS:  Life alert? No  Use of a cane, walker or w/c? No  Grab bars in the bathroom? No  Shower chair or bench in shower? Yes  Elevated toilet seat or a handicapped toilet? No    TIMED UP AND GO:  Was the test performed? No .    Depression Screen PHQ 2/9 Scores 07/19/2019 12/12/2017 12/10/2016 12/11/2015  PHQ - 2 Score  0 0 0 0  PHQ- 9 Score - - - 0     Cognitive  Function     6CIT Screen 07/19/2019 12/10/2016  What Year? 0 points 0 points  What month? 0 points 0 points  What time? 0 points 0 points  Count back from 20 0 points 0 points  Months in reverse 0 points 0 points  Repeat phrase 0 points 0 points  Total Score 0 0    Immunization History  Administered Date(s) Administered  . Influenza, High Dose Seasonal PF 01/21/2015, 02/04/2017, 03/15/2018  . Pneumococcal Conjugate-13 12/04/2014  . Pneumococcal Polysaccharide-23 12/13/2016  . Td 08/15/2000  . Tdap 02/02/2012  . Zoster 01/20/2011    Qualifies for Shingles Vaccine? Yes  Zostavax completed 01/20/11. Due for Shingrix. Pt has been advised to call insurance company to determine out of pocket expense. Advised may also receive vaccine at local pharmacy or Health Dept. Verbalized acceptance and understanding.  Tdap: Up to date  Flu Vaccine: Up to date  Pneumococcal Vaccine: Completed series  Screening Tests Health Maintenance  Topic Date Due  . MAMMOGRAM  08/04/2017  . DEXA SCAN  12/25/2017  . COLONOSCOPY  09/08/2019  . TETANUS/TDAP  02/01/2022  . INFLUENZA VACCINE  Completed  . Hepatitis C Screening  Completed  . PNA vac Low Risk Adult  Completed    Cancer Screenings:  Colorectal Screening: Completed 09/07/16. Repeat every 3 years. Referral to GI placed today. Pt aware the office will call re: appt.  Mammogram: Completed 08/05/15. Repeat every 1-2 years as advised. Ordered today. Pt provided with contact info and advised to call to schedule appt.   Bone Density: Completed 01/13/16. Results reflect OSTEOPOROSIS. Repeat every 2 years. Ordered today. Pt aware the office will call re: appt.  Lung Cancer Screening: (Low Dose CT Chest recommended if Age 32-80 years, 30 pack-year currently smoking OR have quit w/in 15years.) does not qualify.   Additional Screening:  Hepatitis C Screening: Up to date  Vision Screening: Recommended annual ophthalmology exams for early detection of  glaucoma and other disorders of the eye.  Dental Screening: Recommended annual dental exams for proper oral hygiene  Community Resource Referral:  CRR required this visit?  No       Plan:  I have personally reviewed and addressed the Medicare Annual Wellness questionnaire and have noted the following in the patient's chart:  A. Medical and social history B. Use of alcohol, tobacco or illicit drugs  C. Current medications and supplements D. Functional ability and status E.  Nutritional status F.  Physical activity G. Advance directives H. List of other physicians I.  Hospitalizations, surgeries, and ER visits in previous 12 months J.  [place] such as hearing and vision if needed, cognitive and depression L. Referrals and appointments   In addition, I have reviewed and discussed with patient certain preventive protocols, quality metrics, and best practice recommendations. A written personalized care plan for preventive services as well as general preventive health recommendations were provided to patient. Nurse Health Advisor  Signed,    Antoni Stefan [place], [place]  1/66/0630 Nurse Health Advisor   Nurse Notes: Mammogram and DEXA scan ordered today. Referral placed for colonoscopy. Pt aware she is not due until 09/2019.

## 2019-07-19 ENCOUNTER — Other Ambulatory Visit: Payer: Self-pay

## 2019-07-19 ENCOUNTER — Ambulatory Visit (INDEPENDENT_AMBULATORY_CARE_PROVIDER_SITE_OTHER): Payer: Medicare Other

## 2019-07-19 DIAGNOSIS — N186 End stage renal disease: Secondary | ICD-10-CM | POA: Diagnosis not present

## 2019-07-19 DIAGNOSIS — Z1211 Encounter for screening for malignant neoplasm of colon: Secondary | ICD-10-CM | POA: Diagnosis not present

## 2019-07-19 DIAGNOSIS — Z992 Dependence on renal dialysis: Secondary | ICD-10-CM | POA: Diagnosis not present

## 2019-07-19 DIAGNOSIS — Z1231 Encounter for screening mammogram for malignant neoplasm of breast: Secondary | ICD-10-CM

## 2019-07-19 DIAGNOSIS — Z0000 Encounter for general adult medical examination without abnormal findings: Secondary | ICD-10-CM

## 2019-07-19 DIAGNOSIS — M810 Age-related osteoporosis without current pathological fracture: Secondary | ICD-10-CM | POA: Diagnosis not present

## 2019-07-19 DIAGNOSIS — N2581 Secondary hyperparathyroidism of renal origin: Secondary | ICD-10-CM | POA: Diagnosis not present

## 2019-07-19 NOTE — Patient Instructions (Addendum)
Amy [name] , Thank you for taking time to come for your Medicare Wellness Visit. I appreciate your ongoing commitment to your health goals. Please review the following plan we discussed and let me know if I can assist you in the future.   Screening recommendations/referrals: Colonoscopy: Up to date, due 09/2019. Referral to GI placed today. Pt aware the office will call re: appt. Mammogram: Ordered today. Pt provided with contact info and advised to call to schedule appt.  Bone Density: Ordered today. Pt aware the office will call re: appt. Recommended yearly ophthalmology/optometry visit for glaucoma screening and checkup Recommended yearly dental visit for hygiene and checkup  Vaccinations: Influenza vaccine: Up to date Pneumococcal vaccine: Completed series Tdap vaccine: Up to date Shingles vaccine: Pt declines today.     Advanced directives: Please bring a copy of your POA (Power of Attorney) and/or Living Will to your next appointment.   Conditions/risks identified: Fall risk prevention discussed. Recommended to start exercising 3 days a week for at least 30 minutes at a time.  Next appointment: 10/09/19 @ 2:00 PM with Dr Caryn Section. Declined scheduling an AWV for 2022 at this time.    Preventive Care 8 Years and Older, Female Preventive care refers to lifestyle choices and visits with your health care provider that can promote health and wellness. What does preventive care include?  A yearly physical exam. This is also called an annual well check.  Dental exams once or twice a year.  Routine eye exams. Ask your health care provider how often you should have your eyes checked.  Personal lifestyle choices, including:  Daily care of your teeth and gums.  Regular physical activity.  Eating a healthy diet.  Avoiding tobacco and drug use.  Limiting alcohol use.  Practicing safe sex.  Taking low-dose aspirin every day.  Taking vitamin and mineral supplements as recommended by  your health care provider. What happens during an annual well check? The services and screenings done by your health care provider during your annual well check will depend on your age, overall health, lifestyle risk factors, and family history of disease. Counseling  Your health care provider may ask you questions about your:  Alcohol use.  Tobacco use.  Drug use.  Emotional well-being.  Home and relationship well-being.  Sexual activity.  Eating habits.  History of falls.  Memory and ability to understand (cognition).  Work and work Statistician.  Reproductive health. Screening  You may have the following tests or measurements:  Height, weight, and BMI.  Blood pressure.  Lipid and cholesterol levels. These may be checked every 5 years, or more frequently if you are over 19 years old.  Skin check.  Lung cancer screening. You may have this screening every year starting at age 11 if you have a 30-pack-year history of smoking and currently smoke or have quit within the past 15 years.  Fecal occult blood test (FOBT) of the stool. You may have this test every year starting at age 5.  Flexible sigmoidoscopy or colonoscopy. You may have a sigmoidoscopy every 5 years or a colonoscopy every 10 years starting at age 60.  Hepatitis C blood test.  Hepatitis B blood test.  Sexually transmitted disease (STD) testing.  Diabetes screening. This is done by checking your blood sugar (glucose) after you have not eaten for a while (fasting). You may have this done every 1-3 years.  Bone density scan. This is done to screen for osteoporosis. You may have this done starting at  age 51.  Mammogram. This may be done every 1-2 years. Talk to your health care provider about how often you should have regular mammograms. Talk with your health care provider about your test results, treatment options, and if necessary, the need for more tests. Vaccines  Your health care provider may  recommend certain vaccines, such as:  Influenza vaccine. This is recommended every year.  Tetanus, diphtheria, and acellular pertussis (Tdap, Td) vaccine. You may need a Td booster every 10 years.  Zoster vaccine. You may need this after age 35.  Pneumococcal 13-valent conjugate (PCV13) vaccine. One dose is recommended after age 20.  Pneumococcal polysaccharide (PPSV23) vaccine. One dose is recommended after age 8. Talk to your health care provider about which screenings and vaccines you need and how often you need them. This information is not intended to replace advice given to you by your health care provider. Make sure you discuss any questions you have with your health care provider. Document Released: 05/16/2015 Document Revised: 01/07/2016 Document Reviewed: 02/18/2015 Elsevier Interactive Patient Education  2017 [place] Prevention in the Home Falls can cause injuries. They can happen to people of all ages. There are many things you can do to make your home safe and to help prevent falls. What can I do on the outside of my home?  Regularly fix the edges of walkways and driveways and fix any cracks.  Remove anything that might make you trip as you walk through a door, such as a raised step or threshold.  Trim any bushes or trees on the path to your home.  Use bright outdoor lighting.  Clear any walking paths of anything that might make someone trip, such as rocks or tools.  Regularly check to see if handrails are loose or broken. Make sure that both sides of any steps have handrails.  Any raised decks and porches should have guardrails on the edges.  Have any leaves, snow, or ice cleared regularly.  Use sand or salt on walking paths during winter.  Clean up any spills in your garage right away. This includes oil or grease spills. What can I do in the bathroom?  Use night lights.  Install grab bars by the toilet and in the tub and shower. Do not use towel  bars as grab bars.  Use non-skid mats or decals in the tub or shower.  If you need to sit down in the shower, use a plastic, non-slip stool.  Keep the floor dry. Clean up any water that spills on the floor as soon as it happens.  Remove soap buildup in the tub or shower regularly.  Attach bath mats securely with double-sided non-slip rug tape.  Do not have throw rugs and other things on the floor that can make you trip. What can I do in the bedroom?  Use night lights.  Make sure that you have a light by your bed that is easy to reach.  Do not use any sheets or blankets that are too big for your bed. They should not hang down onto the floor.  Have a firm chair that has side arms. You can use this for support while you get dressed.  Do not have throw rugs and other things on the floor that can make you trip. What can I do in the kitchen?  Clean up any spills right away.  Avoid walking on wet floors.  Keep items that you use a lot in easy-to-reach places.  If you  need to reach something above you, use a strong step stool that has a grab bar.  Keep electrical cords out of the way.  Do not use floor polish or wax that makes floors slippery. If you must use wax, use non-skid floor wax.  Do not have throw rugs and other things on the floor that can make you trip. What can I do with my stairs?  Do not leave any items on the stairs.  Make sure that there are handrails on both sides of the stairs and use them. Fix handrails that are broken or loose. Make sure that handrails are as long as the stairways.  Check any carpeting to make sure that it is firmly attached to the stairs. Fix any carpet that is loose or worn.  Avoid having throw rugs at the top or bottom of the stairs. If you do have throw rugs, attach them to the floor with carpet tape.  Make sure that you have a light switch at the top of the stairs and the bottom of the stairs. If you do not have them, ask someone to  add them for you. What else can I do to help prevent falls?  Wear shoes that:  Do not have high heels.  Have rubber bottoms.  Are comfortable and fit you well.  Are closed at the toe. Do not wear sandals.  If you use a stepladder:  Make sure that it is fully opened. Do not climb a closed stepladder.  Make sure that both sides of the stepladder are locked into place.  Ask someone to hold it for you, if possible.  Clearly mark and make sure that you can see:  Any grab bars or handrails.  First and last steps.  Where the edge of each step is.  Use tools that help you move around (mobility aids) if they are needed. These include:  Canes.  Walkers.  Scooters.  Crutches.  Turn on the lights when you go into a dark area. Replace any light bulbs as soon as they burn out.  Set up your furniture so you have a clear path. Avoid moving your furniture around.  If any of your floors are uneven, fix them.  If there are any pets around you, be aware of where they are.  Review your medicines with your doctor. Some medicines can make you feel dizzy. This can increase your chance of falling. Ask your doctor what other things that you can do to help prevent falls. This information is not intended to replace advice given to you by your health care provider. Make sure you discuss any questions you have with your health care provider. Document Released: 02/13/2009 Document Revised: 09/25/2015 Document Reviewed: 05/24/2014 Elsevier Interactive Patient Education  2017 [employer].

## 2019-07-20 DIAGNOSIS — Z992 Dependence on renal dialysis: Secondary | ICD-10-CM | POA: Diagnosis not present

## 2019-07-20 DIAGNOSIS — N2581 Secondary hyperparathyroidism of renal origin: Secondary | ICD-10-CM | POA: Diagnosis not present

## 2019-07-20 DIAGNOSIS — N186 End stage renal disease: Secondary | ICD-10-CM | POA: Diagnosis not present

## 2019-07-21 DIAGNOSIS — N186 End stage renal disease: Secondary | ICD-10-CM | POA: Diagnosis not present

## 2019-07-21 DIAGNOSIS — Z992 Dependence on renal dialysis: Secondary | ICD-10-CM | POA: Diagnosis not present

## 2019-07-21 DIAGNOSIS — N2581 Secondary hyperparathyroidism of renal origin: Secondary | ICD-10-CM | POA: Diagnosis not present

## 2019-07-22 DIAGNOSIS — N2581 Secondary hyperparathyroidism of renal origin: Secondary | ICD-10-CM | POA: Diagnosis not present

## 2019-07-22 DIAGNOSIS — N186 End stage renal disease: Secondary | ICD-10-CM | POA: Diagnosis not present

## 2019-07-22 DIAGNOSIS — Z992 Dependence on renal dialysis: Secondary | ICD-10-CM | POA: Diagnosis not present

## 2019-07-23 DIAGNOSIS — N2581 Secondary hyperparathyroidism of renal origin: Secondary | ICD-10-CM | POA: Diagnosis not present

## 2019-07-23 DIAGNOSIS — Z992 Dependence on renal dialysis: Secondary | ICD-10-CM | POA: Diagnosis not present

## 2019-07-23 DIAGNOSIS — N186 End stage renal disease: Secondary | ICD-10-CM | POA: Diagnosis not present

## 2019-07-24 DIAGNOSIS — N2581 Secondary hyperparathyroidism of renal origin: Secondary | ICD-10-CM | POA: Diagnosis not present

## 2019-07-24 DIAGNOSIS — N186 End stage renal disease: Secondary | ICD-10-CM | POA: Diagnosis not present

## 2019-07-24 DIAGNOSIS — Z992 Dependence on renal dialysis: Secondary | ICD-10-CM | POA: Diagnosis not present

## 2019-07-25 ENCOUNTER — Other Ambulatory Visit: Payer: Self-pay | Admitting: Family Medicine

## 2019-07-25 DIAGNOSIS — N186 End stage renal disease: Secondary | ICD-10-CM | POA: Diagnosis not present

## 2019-07-25 DIAGNOSIS — N2581 Secondary hyperparathyroidism of renal origin: Secondary | ICD-10-CM | POA: Diagnosis not present

## 2019-07-25 DIAGNOSIS — G4700 Insomnia, unspecified: Secondary | ICD-10-CM

## 2019-07-25 DIAGNOSIS — Z992 Dependence on renal dialysis: Secondary | ICD-10-CM | POA: Diagnosis not present

## 2019-07-25 NOTE — Telephone Encounter (Signed)
LOV 05/07/19

## 2019-07-26 DIAGNOSIS — N186 End stage renal disease: Secondary | ICD-10-CM | POA: Diagnosis not present

## 2019-07-26 DIAGNOSIS — N2581 Secondary hyperparathyroidism of renal origin: Secondary | ICD-10-CM | POA: Diagnosis not present

## 2019-07-26 DIAGNOSIS — Z992 Dependence on renal dialysis: Secondary | ICD-10-CM | POA: Diagnosis not present

## 2019-07-27 DIAGNOSIS — Z992 Dependence on renal dialysis: Secondary | ICD-10-CM | POA: Diagnosis not present

## 2019-07-27 DIAGNOSIS — N2581 Secondary hyperparathyroidism of renal origin: Secondary | ICD-10-CM | POA: Diagnosis not present

## 2019-07-27 DIAGNOSIS — N186 End stage renal disease: Secondary | ICD-10-CM | POA: Diagnosis not present

## 2019-07-28 DIAGNOSIS — Z992 Dependence on renal dialysis: Secondary | ICD-10-CM | POA: Diagnosis not present

## 2019-07-28 DIAGNOSIS — N186 End stage renal disease: Secondary | ICD-10-CM | POA: Diagnosis not present

## 2019-07-28 DIAGNOSIS — N2581 Secondary hyperparathyroidism of renal origin: Secondary | ICD-10-CM | POA: Diagnosis not present

## 2019-07-29 DIAGNOSIS — Z992 Dependence on renal dialysis: Secondary | ICD-10-CM | POA: Diagnosis not present

## 2019-07-29 DIAGNOSIS — N2581 Secondary hyperparathyroidism of renal origin: Secondary | ICD-10-CM | POA: Diagnosis not present

## 2019-07-29 DIAGNOSIS — N186 End stage renal disease: Secondary | ICD-10-CM | POA: Diagnosis not present

## 2019-07-30 DIAGNOSIS — N186 End stage renal disease: Secondary | ICD-10-CM | POA: Diagnosis not present

## 2019-07-30 DIAGNOSIS — Z992 Dependence on renal dialysis: Secondary | ICD-10-CM | POA: Diagnosis not present

## 2019-07-30 DIAGNOSIS — N2581 Secondary hyperparathyroidism of renal origin: Secondary | ICD-10-CM | POA: Diagnosis not present

## 2019-07-31 DIAGNOSIS — N186 End stage renal disease: Secondary | ICD-10-CM | POA: Diagnosis not present

## 2019-07-31 DIAGNOSIS — N2581 Secondary hyperparathyroidism of renal origin: Secondary | ICD-10-CM | POA: Diagnosis not present

## 2019-07-31 DIAGNOSIS — Z992 Dependence on renal dialysis: Secondary | ICD-10-CM | POA: Diagnosis not present

## 2019-08-01 DIAGNOSIS — N2581 Secondary hyperparathyroidism of renal origin: Secondary | ICD-10-CM | POA: Diagnosis not present

## 2019-08-01 DIAGNOSIS — N186 End stage renal disease: Secondary | ICD-10-CM | POA: Diagnosis not present

## 2019-08-01 DIAGNOSIS — Z992 Dependence on renal dialysis: Secondary | ICD-10-CM | POA: Diagnosis not present

## 2019-08-02 DIAGNOSIS — N186 End stage renal disease: Secondary | ICD-10-CM | POA: Diagnosis not present

## 2019-08-02 DIAGNOSIS — D631 Anemia in chronic kidney disease: Secondary | ICD-10-CM | POA: Diagnosis not present

## 2019-08-02 DIAGNOSIS — D509 Iron deficiency anemia, unspecified: Secondary | ICD-10-CM | POA: Diagnosis not present

## 2019-08-02 DIAGNOSIS — Z992 Dependence on renal dialysis: Secondary | ICD-10-CM | POA: Diagnosis not present

## 2019-08-03 DIAGNOSIS — D509 Iron deficiency anemia, unspecified: Secondary | ICD-10-CM | POA: Diagnosis not present

## 2019-08-03 DIAGNOSIS — N186 End stage renal disease: Secondary | ICD-10-CM | POA: Diagnosis not present

## 2019-08-03 DIAGNOSIS — Z992 Dependence on renal dialysis: Secondary | ICD-10-CM | POA: Diagnosis not present

## 2019-08-03 DIAGNOSIS — D631 Anemia in chronic kidney disease: Secondary | ICD-10-CM | POA: Diagnosis not present

## 2019-08-04 DIAGNOSIS — D509 Iron deficiency anemia, unspecified: Secondary | ICD-10-CM | POA: Diagnosis not present

## 2019-08-04 DIAGNOSIS — D631 Anemia in chronic kidney disease: Secondary | ICD-10-CM | POA: Diagnosis not present

## 2019-08-04 DIAGNOSIS — N186 End stage renal disease: Secondary | ICD-10-CM | POA: Diagnosis not present

## 2019-08-04 DIAGNOSIS — Z992 Dependence on renal dialysis: Secondary | ICD-10-CM | POA: Diagnosis not present

## 2019-08-05 DIAGNOSIS — D631 Anemia in chronic kidney disease: Secondary | ICD-10-CM | POA: Diagnosis not present

## 2019-08-05 DIAGNOSIS — Z992 Dependence on renal dialysis: Secondary | ICD-10-CM | POA: Diagnosis not present

## 2019-08-05 DIAGNOSIS — N186 End stage renal disease: Secondary | ICD-10-CM | POA: Diagnosis not present

## 2019-08-05 DIAGNOSIS — D509 Iron deficiency anemia, unspecified: Secondary | ICD-10-CM | POA: Diagnosis not present

## 2019-08-06 DIAGNOSIS — N186 End stage renal disease: Secondary | ICD-10-CM | POA: Diagnosis not present

## 2019-08-06 DIAGNOSIS — Z992 Dependence on renal dialysis: Secondary | ICD-10-CM | POA: Diagnosis not present

## 2019-08-06 DIAGNOSIS — D631 Anemia in chronic kidney disease: Secondary | ICD-10-CM | POA: Diagnosis not present

## 2019-08-06 DIAGNOSIS — D509 Iron deficiency anemia, unspecified: Secondary | ICD-10-CM | POA: Diagnosis not present

## 2019-08-07 DIAGNOSIS — D509 Iron deficiency anemia, unspecified: Secondary | ICD-10-CM | POA: Diagnosis not present

## 2019-08-07 DIAGNOSIS — D631 Anemia in chronic kidney disease: Secondary | ICD-10-CM | POA: Diagnosis not present

## 2019-08-07 DIAGNOSIS — Z992 Dependence on renal dialysis: Secondary | ICD-10-CM | POA: Diagnosis not present

## 2019-08-07 DIAGNOSIS — N186 End stage renal disease: Secondary | ICD-10-CM | POA: Diagnosis not present

## 2019-08-08 DIAGNOSIS — D631 Anemia in chronic kidney disease: Secondary | ICD-10-CM | POA: Diagnosis not present

## 2019-08-08 DIAGNOSIS — D509 Iron deficiency anemia, unspecified: Secondary | ICD-10-CM | POA: Diagnosis not present

## 2019-08-08 DIAGNOSIS — Z992 Dependence on renal dialysis: Secondary | ICD-10-CM | POA: Diagnosis not present

## 2019-08-08 DIAGNOSIS — N186 End stage renal disease: Secondary | ICD-10-CM | POA: Diagnosis not present

## 2019-08-09 ENCOUNTER — Encounter: Payer: Self-pay | Admitting: *Deleted

## 2019-08-09 DIAGNOSIS — D631 Anemia in chronic kidney disease: Secondary | ICD-10-CM | POA: Diagnosis not present

## 2019-08-09 DIAGNOSIS — D509 Iron deficiency anemia, unspecified: Secondary | ICD-10-CM | POA: Diagnosis not present

## 2019-08-09 DIAGNOSIS — Z992 Dependence on renal dialysis: Secondary | ICD-10-CM | POA: Diagnosis not present

## 2019-08-09 DIAGNOSIS — N186 End stage renal disease: Secondary | ICD-10-CM | POA: Diagnosis not present

## 2019-08-10 DIAGNOSIS — D509 Iron deficiency anemia, unspecified: Secondary | ICD-10-CM | POA: Diagnosis not present

## 2019-08-10 DIAGNOSIS — Z992 Dependence on renal dialysis: Secondary | ICD-10-CM | POA: Diagnosis not present

## 2019-08-10 DIAGNOSIS — D631 Anemia in chronic kidney disease: Secondary | ICD-10-CM | POA: Diagnosis not present

## 2019-08-10 DIAGNOSIS — N186 End stage renal disease: Secondary | ICD-10-CM | POA: Diagnosis not present

## 2019-08-11 DIAGNOSIS — Z992 Dependence on renal dialysis: Secondary | ICD-10-CM | POA: Diagnosis not present

## 2019-08-11 DIAGNOSIS — D509 Iron deficiency anemia, unspecified: Secondary | ICD-10-CM | POA: Diagnosis not present

## 2019-08-11 DIAGNOSIS — N186 End stage renal disease: Secondary | ICD-10-CM | POA: Diagnosis not present

## 2019-08-11 DIAGNOSIS — D631 Anemia in chronic kidney disease: Secondary | ICD-10-CM | POA: Diagnosis not present

## 2019-08-12 DIAGNOSIS — N186 End stage renal disease: Secondary | ICD-10-CM | POA: Diagnosis not present

## 2019-08-12 DIAGNOSIS — D509 Iron deficiency anemia, unspecified: Secondary | ICD-10-CM | POA: Diagnosis not present

## 2019-08-12 DIAGNOSIS — Z992 Dependence on renal dialysis: Secondary | ICD-10-CM | POA: Diagnosis not present

## 2019-08-12 DIAGNOSIS — D631 Anemia in chronic kidney disease: Secondary | ICD-10-CM | POA: Diagnosis not present

## 2019-08-13 ENCOUNTER — Telehealth (INDEPENDENT_AMBULATORY_CARE_PROVIDER_SITE_OTHER): Payer: Self-pay

## 2019-08-13 DIAGNOSIS — D509 Iron deficiency anemia, unspecified: Secondary | ICD-10-CM | POA: Diagnosis not present

## 2019-08-13 DIAGNOSIS — Z992 Dependence on renal dialysis: Secondary | ICD-10-CM | POA: Diagnosis not present

## 2019-08-13 DIAGNOSIS — N186 End stage renal disease: Secondary | ICD-10-CM | POA: Diagnosis not present

## 2019-08-13 DIAGNOSIS — D631 Anemia in chronic kidney disease: Secondary | ICD-10-CM | POA: Diagnosis not present

## 2019-08-13 NOTE — Telephone Encounter (Signed)
Spoke with the patient and she is now scheduled with Dr. Lucky Cowboy for a permcath removal on 08/16/19 with a 9:45 am arrival time to the [facility]. Patient will do covid testing on 08/14/19 between 8-1 pm at the [facility]. Pre-procedure instructions were discussed and will be mailed to the patient.

## 2019-08-14 ENCOUNTER — Other Ambulatory Visit
Admission: RE | Admit: 2019-08-14 | Discharge: 2019-08-14 | Disposition: A | Discharge status: Home / Self Care | Payer: Medicare Other | Source: Ambulatory Visit | Attending: Vascular Surgery | Admitting: Vascular Surgery

## 2019-08-14 DIAGNOSIS — Z20822 Contact with and (suspected) exposure to covid-19: Secondary | ICD-10-CM | POA: Insufficient documentation

## 2019-08-14 DIAGNOSIS — Z992 Dependence on renal dialysis: Secondary | ICD-10-CM | POA: Diagnosis not present

## 2019-08-14 DIAGNOSIS — D631 Anemia in chronic kidney disease: Secondary | ICD-10-CM | POA: Diagnosis not present

## 2019-08-14 DIAGNOSIS — Z01812 Encounter for preprocedural laboratory examination: Secondary | ICD-10-CM | POA: Insufficient documentation

## 2019-08-14 DIAGNOSIS — D509 Iron deficiency anemia, unspecified: Secondary | ICD-10-CM | POA: Diagnosis not present

## 2019-08-14 DIAGNOSIS — N186 End stage renal disease: Secondary | ICD-10-CM | POA: Diagnosis not present

## 2019-08-14 LAB — SARS CORONAVIRUS 2 (TAT 6-24 HRS): SARS Coronavirus 2: NEGATIVE

## 2019-08-15 ENCOUNTER — Other Ambulatory Visit (INDEPENDENT_AMBULATORY_CARE_PROVIDER_SITE_OTHER): Payer: Self-pay | Admitting: Nurse Practitioner

## 2019-08-15 DIAGNOSIS — D509 Iron deficiency anemia, unspecified: Secondary | ICD-10-CM | POA: Diagnosis not present

## 2019-08-15 DIAGNOSIS — Z992 Dependence on renal dialysis: Secondary | ICD-10-CM | POA: Diagnosis not present

## 2019-08-15 DIAGNOSIS — D631 Anemia in chronic kidney disease: Secondary | ICD-10-CM | POA: Diagnosis not present

## 2019-08-15 DIAGNOSIS — N186 End stage renal disease: Secondary | ICD-10-CM | POA: Diagnosis not present

## 2019-08-16 ENCOUNTER — Ambulatory Visit
Admission: RE | Admit: 2019-08-16 | Discharge: 2019-08-16 | Disposition: A | Discharge status: Home / Self Care | Payer: Medicare Other | Attending: Vascular Surgery | Admitting: Vascular Surgery

## 2019-08-16 ENCOUNTER — Encounter
Admission: RE | Disposition: A | Discharge status: Home / Self Care | Payer: Self-pay | Source: Home / Self Care | Attending: Vascular Surgery

## 2019-08-16 DIAGNOSIS — Z880 Allergy status to penicillin: Secondary | ICD-10-CM | POA: Diagnosis not present

## 2019-08-16 DIAGNOSIS — Z881 Allergy status to other antibiotic agents status: Secondary | ICD-10-CM | POA: Diagnosis not present

## 2019-08-16 DIAGNOSIS — Z992 Dependence on renal dialysis: Secondary | ICD-10-CM | POA: Diagnosis not present

## 2019-08-16 DIAGNOSIS — Z4901 Encounter for fitting and adjustment of extracorporeal dialysis catheter: Secondary | ICD-10-CM | POA: Diagnosis not present

## 2019-08-16 DIAGNOSIS — Z882 Allergy status to sulfonamides status: Secondary | ICD-10-CM | POA: Diagnosis not present

## 2019-08-16 DIAGNOSIS — D509 Iron deficiency anemia, unspecified: Secondary | ICD-10-CM | POA: Diagnosis not present

## 2019-08-16 DIAGNOSIS — D631 Anemia in chronic kidney disease: Secondary | ICD-10-CM | POA: Diagnosis not present

## 2019-08-16 DIAGNOSIS — I120 Hypertensive chronic kidney disease with stage 5 chronic kidney disease or end stage renal disease: Secondary | ICD-10-CM | POA: Insufficient documentation

## 2019-08-16 DIAGNOSIS — N186 End stage renal disease: Secondary | ICD-10-CM | POA: Diagnosis not present

## 2019-08-16 DIAGNOSIS — K219 Gastro-esophageal reflux disease without esophagitis: Secondary | ICD-10-CM | POA: Diagnosis not present

## 2019-08-16 HISTORY — PX: DIALYSIS/PERMA CATHETER REMOVAL: CATH118289

## 2019-08-16 SURGERY — DIALYSIS/PERMA CATHETER REMOVAL
Anesthesia: LOCAL

## 2019-08-16 MED ORDER — LIDOCAINE-EPINEPHRINE (PF) 1 %-1:200000 IJ SOLN
INTRAMUSCULAR | Status: DC | PRN
  Administered 2019-08-16: 10 mL via INTRADERMAL

## 2019-08-16 SURGICAL SUPPLY — 2 items
FORCEPS HALSTEAD CVD 5IN STRL (INSTRUMENTS) ×1 IMPLANT
TRAY LACERAT/PLASTIC (MISCELLANEOUS) ×1 IMPLANT

## 2019-08-16 NOTE — Op Note (Signed)
Operative Note  Preoperative diagnosis:    1. ESRD with functional permanent access  Postoperative diagnosis:   1. ESRD with functional permanent access  Procedure:  Removal of Right Permcath  Surgeon:  Leotis Pain, MD  Anesthesia:  Local  EBL:  Minimal  Indication for the Procedure:  The patient has a functional permanent dialysis access and no longer needs their permcath.  This can be removed.  Risks and benefits are discussed and informed consent is obtained.  Description of the Procedure:  The patient's right neck, chest and existing catheter were sterilely prepped and draped. The area around the catheter was anesthetized copiously with 1% lidocaine. The catheter was dissected out with curved hemostats until the cuff was freed from the surrounding fibrous sheath. The fiber sheath was transected, and the catheter was then removed in its entirety using gentle traction. Pressure was held and sterile dressings were placed. The patient tolerated the procedure well and was taken to the recovery room in stable condition.  Discussed with Dr. Ellis Parents A [facility]  08/16/2019, 11:12 AM   This note was created with Dragon Medical transcription system. Any errors in dictation are purely unintentional.

## 2019-08-16 NOTE — Discharge Instructions (Signed)
Tunneled Catheter Removal, Care After Refer to this sheet in the next few weeks. These instructions provide you with information about caring for yourself after your procedure. Your health care provider may also give you more specific instructions. Your treatment has been planned according to current medical practices, but problems sometimes occur. Call your health care provider if you have any problems or questions after your procedure. What can I expect after the procedure? After the procedure, it is common to have: Some mild redness, swelling, and pain around your catheter site.   Follow these instructions at home: Incision care  Check your removal site  every day for signs of infection. Check for: More redness, swelling, or pain. More fluid or blood. Warmth. Pus or a bad smell. Remove your dressing in 48hrs leave open to air  Activity  Return to your normal activities as told by your health care provider. Ask your health care provider what activities are safe for you. Do not lift anything that is heavier than 10 lb (4.5 kg) for 3 days  You may shower tomorrow  Contact a health care provider if: You have more fluid or blood coming from your removal site You have more redness, swelling, or pain at your incisions or around the area where your catheter was removed Your removal site feel warm to the touch. You feel unusually weak. You feel nauseous.. Get help right away if You have swelling in your arm, shoulder, neck, or face. You develop chest pain. You have difficulty breathing. You feel dizzy or light-headed. You have pus or a bad smell coming from your removal site You have a fever. You develop bleeding from your removal site, and your bleeding does not stop. This information is not intended to replace advice given to you by your health care provider. Make sure you discuss any questions you have with your health care provider. Document Released: 04/05/2012 Document Revised:  12/21/2015 Document Reviewed: 01/13/2015 Elsevier Interactive Patient Education  2017 Elsevier Inc. 

## 2019-08-16 NOTE — H&P (Signed)
[facility] Admission History & Physical  MRN : [id number]  Amy [name] is a 69 y.o. (1951/02/16) female who presents with chief complaint of No chief complaint on file. [name]  History of Present Illness: I am asked to evaluate the patient by the dialysis center. The patient was sent here because they have a nonfunctioning tunneled catheter and a functioning PD catheter.  The patient reports they're not been any problems with any of their dialysis runs. They are reporting good flows with good parameters at dialysis.  Patient denies pain or tenderness overlying the access.  There is no pain with dialysis.  The patient denies hand pain or finger pain consistent with steal syndrome.  No fevers or chills while on dialysis.   No current facility-administered medications for this encounter.    Past Medical History:  Diagnosis Date  . Anemia    a. 08/2016 Guaiac + stool. EGD w/ gastritis.  . Anxiety   . CKD (chronic kidney disease), stage III   . Colon polyp    a. 08/2016 Colonoscopy.  . Gastritis    a. 08/2016 EGD: Gastritis-->PPI.  [name] GERD (gastroesophageal reflux disease)   . History of chicken pox   . History of measles as a child   . Hypertension   . Hyponatremia    a. 08/2016 in setting of HCTZ Rx.  . Multiple thyroid nodules     Past Surgical History:  Procedure Laterality Date  . ABDOMINAL HYSTERECTOMY  2003   due to heavy periods and clotting during menses  . BREAST LUMPECTOMY Left 1990  . CHOLECYSTECTOMY  2010  . COLONOSCOPY WITH PROPOFOL N/A 09/07/2016   Procedure: COLONOSCOPY WITH PROPOFOL;  Surgeon: Lucilla Lame, MD;  Location: [facility] ENDOSCOPY;  Service: Endoscopy;  Laterality: N/A;  . DIALYSIS/PERMA CATHETER INSERTION N/A 11/16/2018   Procedure: DIALYSIS/PERMA CATHETER INSERTION;  Surgeon: Algernon Huxley, MD;  Location: [facility];  Service: Cardiovascular;  Laterality: N/A;  . DILATION AND CURETTAGE OF UTERUS  1985  .  ESOPHAGOGASTRODUODENOSCOPY (EGD) WITH PROPOFOL N/A 09/07/2016   Procedure: ESOPHAGOGASTRODUODENOSCOPY (EGD) WITH PROPOFOL;  Surgeon: Lucilla Lame, MD;  Location: [facility] ENDOSCOPY;  Service: Endoscopy;  Laterality: N/A;  . GALLBLADDER SURGERY  2012  . KNEE SURGERY     Dr. Tamala Julian  . MRI, Lumbar spine  07/23/2009   Multilevel annular bulge and disc protrusion at L2-L3, L3-L4, L4-L5, and L5-S1  . TONSILLECTOMY    . TONSILLECTOMY AND ADENOIDECTOMY  1970  . UPPER GI ENDOSCOPY  03/02/2006   H. Pylori negative; Dr. Allen Norris     Social History   Tobacco Use  . Smoking status: Never Smoker  . Smokeless tobacco: Never Used  Substance Use Topics  . Alcohol use: Yes    Alcohol/week: 0.0 - 2.0 standard drinks  . Drug use: No     Family History  Problem Relation Age of Onset  . Pancreatic cancer Father   . Congestive Heart Failure Maternal Grandmother   . Cancer Paternal Grandfather   . Heart disease Maternal Uncle     No family history of bleeding or clotting disorders, autoimmune disease or porphyria  Allergies  Allergen Reactions  . Cephalosporins   . Clarithromycin   . Lunesta  [Eszopiclone]     Heart racing  . Penicillin V     Has patient had a PCN reaction causing immediate rash, facial/tongue/throat swelling, SOB or lightheadedness with hypotension: Yes Has patient had a PCN reaction causing severe rash involving mucus membranes  or skin necrosis: No Has patient had a PCN reaction that required hospitalization No Has patient had a PCN reaction occurring within the last 10 years: No If all of the above answers are "NO", then may proceed with Cephalosporin use.  . Sulfa Antibiotics      REVIEW OF SYSTEMS (Negative unless checked)  Constitutional: [] Weight loss  [] Fever  [] Chills Cardiac: [] Chest pain   [] Chest pressure   [] Palpitations   [] Shortness of breath when laying flat   [] Shortness of breath at rest   [x] Shortness of breath with exertion. Vascular:  [] Pain in legs with  walking   [] Pain in legs at rest   [] Pain in legs when laying flat   [] Claudication   [] Pain in feet when walking  [] Pain in feet at rest  [] Pain in feet when laying flat   [] History of DVT   [] Phlebitis   [] Swelling in legs   [] Varicose veins   [] Non-healing ulcers Pulmonary:   [] Uses home oxygen   [] Productive cough   [] Hemoptysis   [] Wheeze  [] COPD   [] Asthma Neurologic:  [] Dizziness  [] Blackouts   [] Seizures   [] History of stroke   [] History of TIA  [] Aphasia   [] Temporary blindness   [] Dysphagia   [] Weakness or numbness in arms   [] Weakness or numbness in legs Musculoskeletal:  [x] Arthritis   [] Joint swelling   [x] Joint pain   [] Low back pain Hematologic:  [] Easy bruising  [] Easy bleeding   [] Hypercoagulable state   [] Anemic  [] Hepatitis Gastrointestinal:  [] Blood in stool   [] Vomiting blood  [x] Gastroesophageal reflux/heartburn   [] Difficulty swallowing. Genitourinary:  [x] Chronic kidney disease   [] Difficult urination  [] Frequent urination  [] Burning with urination   [] Blood in urine Skin:  [] Rashes   [] Ulcers   [] Wounds Psychological:  [] History of anxiety   []  History of major depression.  Physical Examination  Vitals:   08/16/19 0951  BP: 106/60  Pulse: 70  Resp: 15  Temp: 98.5 F (36.9 C)  TempSrc: Oral  SpO2: 97%   There is no height or weight on file to calculate BMI. Gen: WD/WN, NAD Head: /AT, No temporalis wasting. Ear/Nose/Throat: Hearing grossly intact, nares w/o erythema or drainage, oropharynx w/o Erythema/Exudate,  Eyes: Conjunctiva clear, sclera non-icteric Neck: Trachea midline.  No JVD.  Pulmonary:  Good air movement, respirations not labored, no use of accessory muscles.  Cardiac: RRR, normal S1, S2. Vascular: permcath in right chest Vessel Right Left  Radial Palpable Palpable              Gastrointestinal: soft, non-tender/non-distended. PD catheter in place  Musculoskeletal: M/S 5/5 throughout.  Extremities without ischemic changes.  No deformity or  atrophy.  Neurologic: Sensation grossly intact in extremities.  Symmetrical.  Speech is fluent. Motor exam as listed above. Psychiatric: Judgment intact, Mood & affect appropriate for pt's clinical situation. Dermatologic: No rashes or ulcers noted.  No cellulitis or open wounds.    CBC Lab Results  Component Value Date   WBC 11.5 (H) 04/06/2019   HGB 8.6 (L) 04/06/2019   HCT 25.3 (L) 04/06/2019   MCV 86.9 04/06/2019   PLT 283 04/06/2019    BMET    Component Value Date/Time   NA 136 04/06/2019 0409   NA 135 11/08/2018 1620   K 3.3 (L) 04/06/2019 0409   CL 98 04/06/2019 0409   CO2 29 04/06/2019 0409   GLUCOSE 108 (H) 04/06/2019 0409   BUN 22 04/06/2019 0409   BUN 77 (HH) 11/08/2018 1620  CREATININE 2.80 (H) 04/06/2019 0409   CREATININE 1.93 (H) 04/05/2017 1451   CALCIUM 8.6 (L) 04/06/2019 0409   GFRNONAA 16 (L) 04/06/2019 0409   GFRAA 19 (L) 04/06/2019 0409   CrCl cannot be calculated (Patient's most recent lab result is older than the maximum 21 days allowed.).  COAG Lab Results  Component Value Date   INR 1.2 04/04/2019   INR 1.0 04/03/2019   INR 0.99 06/19/2018    Radiology No results found.  Assessment/Plan 1.  Complication dialysis device:  Patient's Tunneled catheter is not being used. The patient has a PD catheter that is functioning well. Therefore, the patient will undergo removal of the tunneled catheter under local anesthesia.  The risks and benefits were described to the patient.  All questions were answered.  The patient agrees to proceed with angiography and intervention. Potassium will be drawn to ensure that it is an appropriate level prior to performing intervention. 2.  End-stage renal disease requiring hemodialysis:  Patient will continue dialysis therapy without further interruption  3.  Hypertension:  Patient will continue medical management; nephrology is following no changes in oral medications.     Leotis Pain, MD  08/16/2019 10:14 AM

## 2019-08-17 ENCOUNTER — Encounter: Payer: Self-pay | Admitting: Cardiology

## 2019-08-17 DIAGNOSIS — D509 Iron deficiency anemia, unspecified: Secondary | ICD-10-CM | POA: Diagnosis not present

## 2019-08-17 DIAGNOSIS — N186 End stage renal disease: Secondary | ICD-10-CM | POA: Diagnosis not present

## 2019-08-17 DIAGNOSIS — D631 Anemia in chronic kidney disease: Secondary | ICD-10-CM | POA: Diagnosis not present

## 2019-08-17 DIAGNOSIS — Z992 Dependence on renal dialysis: Secondary | ICD-10-CM | POA: Diagnosis not present

## 2019-08-18 DIAGNOSIS — Z992 Dependence on renal dialysis: Secondary | ICD-10-CM | POA: Diagnosis not present

## 2019-08-18 DIAGNOSIS — N186 End stage renal disease: Secondary | ICD-10-CM | POA: Diagnosis not present

## 2019-08-18 DIAGNOSIS — D509 Iron deficiency anemia, unspecified: Secondary | ICD-10-CM | POA: Diagnosis not present

## 2019-08-18 DIAGNOSIS — D631 Anemia in chronic kidney disease: Secondary | ICD-10-CM | POA: Diagnosis not present

## 2019-08-19 DIAGNOSIS — Z992 Dependence on renal dialysis: Secondary | ICD-10-CM | POA: Diagnosis not present

## 2019-08-19 DIAGNOSIS — D631 Anemia in chronic kidney disease: Secondary | ICD-10-CM | POA: Diagnosis not present

## 2019-08-19 DIAGNOSIS — D509 Iron deficiency anemia, unspecified: Secondary | ICD-10-CM | POA: Diagnosis not present

## 2019-08-19 DIAGNOSIS — N186 End stage renal disease: Secondary | ICD-10-CM | POA: Diagnosis not present

## 2019-08-20 DIAGNOSIS — D631 Anemia in chronic kidney disease: Secondary | ICD-10-CM | POA: Diagnosis not present

## 2019-08-20 DIAGNOSIS — Z992 Dependence on renal dialysis: Secondary | ICD-10-CM | POA: Diagnosis not present

## 2019-08-20 DIAGNOSIS — D509 Iron deficiency anemia, unspecified: Secondary | ICD-10-CM | POA: Diagnosis not present

## 2019-08-20 DIAGNOSIS — N186 End stage renal disease: Secondary | ICD-10-CM | POA: Diagnosis not present

## 2019-08-21 ENCOUNTER — Other Ambulatory Visit: Payer: Self-pay | Admitting: Family Medicine

## 2019-08-21 DIAGNOSIS — Z992 Dependence on renal dialysis: Secondary | ICD-10-CM | POA: Diagnosis not present

## 2019-08-21 DIAGNOSIS — M1712 Unilateral primary osteoarthritis, left knee: Secondary | ICD-10-CM | POA: Diagnosis not present

## 2019-08-21 DIAGNOSIS — N186 End stage renal disease: Secondary | ICD-10-CM | POA: Diagnosis not present

## 2019-08-21 DIAGNOSIS — D631 Anemia in chronic kidney disease: Secondary | ICD-10-CM | POA: Diagnosis not present

## 2019-08-21 DIAGNOSIS — D509 Iron deficiency anemia, unspecified: Secondary | ICD-10-CM | POA: Diagnosis not present

## 2019-08-21 NOTE — Telephone Encounter (Signed)
Requested medication (s) are due for refill today: yes  Requested medication (s) are on the active medication list: yes  Last refill:  07/25/19  Future visit scheduled: yes  Notes to clinic:  not delegated    Requested Prescriptions  Pending Prescriptions Disp Refills   LORazepam (ATIVAN) 1 MG tablet [Pharmacy Med Name: LORAZEPAM 1MG  TABLETS] 240 tablet     Sig: TAKE 1 TABLET BY MOUTH TWICE DAILY AS NEEDED      Not Delegated - Psychiatry:  Anxiolytics/Hypnotics Failed - 08/21/2019  8:00 AM      Failed - This refill cannot be delegated      Failed - Urine Drug Screen completed in last 360 days.      Passed - Valid encounter within last 6 months    Recent Outpatient Visits           3 months ago Typical atrial flutter [facility])   [facility] [name], MD   9 months ago CKD (chronic kidney disease) stage 5, GFR less than 15 ml/min [facility])   [facility] [place], Dionne Bucy, MD   10 months ago Cellulitis of lower extremity, unspecified laterality   [facility] [name], MD   10 months ago Cellulitis of lower extremity, unspecified laterality   [facility] Fenton Malling M, [place]   10 months ago Leg swelling   [facility] [name], MD

## 2019-08-22 DIAGNOSIS — Z992 Dependence on renal dialysis: Secondary | ICD-10-CM | POA: Diagnosis not present

## 2019-08-22 DIAGNOSIS — N186 End stage renal disease: Secondary | ICD-10-CM | POA: Diagnosis not present

## 2019-08-22 DIAGNOSIS — D509 Iron deficiency anemia, unspecified: Secondary | ICD-10-CM | POA: Diagnosis not present

## 2019-08-22 DIAGNOSIS — D631 Anemia in chronic kidney disease: Secondary | ICD-10-CM | POA: Diagnosis not present

## 2019-08-23 DIAGNOSIS — N186 End stage renal disease: Secondary | ICD-10-CM | POA: Diagnosis not present

## 2019-08-23 DIAGNOSIS — D631 Anemia in chronic kidney disease: Secondary | ICD-10-CM | POA: Diagnosis not present

## 2019-08-23 DIAGNOSIS — Z992 Dependence on renal dialysis: Secondary | ICD-10-CM | POA: Diagnosis not present

## 2019-08-23 DIAGNOSIS — D509 Iron deficiency anemia, unspecified: Secondary | ICD-10-CM | POA: Diagnosis not present

## 2019-08-24 DIAGNOSIS — D631 Anemia in chronic kidney disease: Secondary | ICD-10-CM | POA: Diagnosis not present

## 2019-08-24 DIAGNOSIS — D509 Iron deficiency anemia, unspecified: Secondary | ICD-10-CM | POA: Diagnosis not present

## 2019-08-24 DIAGNOSIS — N186 End stage renal disease: Secondary | ICD-10-CM | POA: Diagnosis not present

## 2019-08-24 DIAGNOSIS — Z992 Dependence on renal dialysis: Secondary | ICD-10-CM | POA: Diagnosis not present

## 2019-08-25 DIAGNOSIS — D509 Iron deficiency anemia, unspecified: Secondary | ICD-10-CM | POA: Diagnosis not present

## 2019-08-25 DIAGNOSIS — N186 End stage renal disease: Secondary | ICD-10-CM | POA: Diagnosis not present

## 2019-08-25 DIAGNOSIS — D631 Anemia in chronic kidney disease: Secondary | ICD-10-CM | POA: Diagnosis not present

## 2019-08-25 DIAGNOSIS — Z992 Dependence on renal dialysis: Secondary | ICD-10-CM | POA: Diagnosis not present

## 2019-08-26 DIAGNOSIS — D631 Anemia in chronic kidney disease: Secondary | ICD-10-CM | POA: Diagnosis not present

## 2019-08-26 DIAGNOSIS — D509 Iron deficiency anemia, unspecified: Secondary | ICD-10-CM | POA: Diagnosis not present

## 2019-08-26 DIAGNOSIS — Z992 Dependence on renal dialysis: Secondary | ICD-10-CM | POA: Diagnosis not present

## 2019-08-26 DIAGNOSIS — N186 End stage renal disease: Secondary | ICD-10-CM | POA: Diagnosis not present

## 2019-08-27 DIAGNOSIS — Z992 Dependence on renal dialysis: Secondary | ICD-10-CM | POA: Diagnosis not present

## 2019-08-27 DIAGNOSIS — D631 Anemia in chronic kidney disease: Secondary | ICD-10-CM | POA: Diagnosis not present

## 2019-08-27 DIAGNOSIS — N186 End stage renal disease: Secondary | ICD-10-CM | POA: Diagnosis not present

## 2019-08-27 DIAGNOSIS — D509 Iron deficiency anemia, unspecified: Secondary | ICD-10-CM | POA: Diagnosis not present

## 2019-08-27 DIAGNOSIS — M1711 Unilateral primary osteoarthritis, right knee: Secondary | ICD-10-CM | POA: Diagnosis not present

## 2019-08-27 DIAGNOSIS — M1712 Unilateral primary osteoarthritis, left knee: Secondary | ICD-10-CM | POA: Diagnosis not present

## 2019-08-28 DIAGNOSIS — Z992 Dependence on renal dialysis: Secondary | ICD-10-CM | POA: Diagnosis not present

## 2019-08-28 DIAGNOSIS — D631 Anemia in chronic kidney disease: Secondary | ICD-10-CM | POA: Diagnosis not present

## 2019-08-28 DIAGNOSIS — D509 Iron deficiency anemia, unspecified: Secondary | ICD-10-CM | POA: Diagnosis not present

## 2019-08-28 DIAGNOSIS — N186 End stage renal disease: Secondary | ICD-10-CM | POA: Diagnosis not present

## 2019-08-29 DIAGNOSIS — Z992 Dependence on renal dialysis: Secondary | ICD-10-CM | POA: Diagnosis not present

## 2019-08-29 DIAGNOSIS — D631 Anemia in chronic kidney disease: Secondary | ICD-10-CM | POA: Diagnosis not present

## 2019-08-29 DIAGNOSIS — D509 Iron deficiency anemia, unspecified: Secondary | ICD-10-CM | POA: Diagnosis not present

## 2019-08-29 DIAGNOSIS — N186 End stage renal disease: Secondary | ICD-10-CM | POA: Diagnosis not present

## 2019-08-30 DIAGNOSIS — Z992 Dependence on renal dialysis: Secondary | ICD-10-CM | POA: Diagnosis not present

## 2019-08-30 DIAGNOSIS — N186 End stage renal disease: Secondary | ICD-10-CM | POA: Diagnosis not present

## 2019-08-30 DIAGNOSIS — D509 Iron deficiency anemia, unspecified: Secondary | ICD-10-CM | POA: Diagnosis not present

## 2019-08-30 DIAGNOSIS — D631 Anemia in chronic kidney disease: Secondary | ICD-10-CM | POA: Diagnosis not present

## 2019-08-31 DIAGNOSIS — D631 Anemia in chronic kidney disease: Secondary | ICD-10-CM | POA: Diagnosis not present

## 2019-08-31 DIAGNOSIS — D509 Iron deficiency anemia, unspecified: Secondary | ICD-10-CM | POA: Diagnosis not present

## 2019-08-31 DIAGNOSIS — N186 End stage renal disease: Secondary | ICD-10-CM | POA: Diagnosis not present

## 2019-08-31 DIAGNOSIS — Z992 Dependence on renal dialysis: Secondary | ICD-10-CM | POA: Diagnosis not present

## 2019-09-01 DIAGNOSIS — N186 End stage renal disease: Secondary | ICD-10-CM | POA: Diagnosis not present

## 2019-09-01 DIAGNOSIS — D631 Anemia in chronic kidney disease: Secondary | ICD-10-CM | POA: Diagnosis not present

## 2019-09-01 DIAGNOSIS — Z992 Dependence on renal dialysis: Secondary | ICD-10-CM | POA: Diagnosis not present

## 2019-09-01 DIAGNOSIS — D509 Iron deficiency anemia, unspecified: Secondary | ICD-10-CM | POA: Diagnosis not present

## 2019-09-02 DIAGNOSIS — N186 End stage renal disease: Secondary | ICD-10-CM | POA: Diagnosis not present

## 2019-09-02 DIAGNOSIS — D509 Iron deficiency anemia, unspecified: Secondary | ICD-10-CM | POA: Diagnosis not present

## 2019-09-02 DIAGNOSIS — Z992 Dependence on renal dialysis: Secondary | ICD-10-CM | POA: Diagnosis not present

## 2019-09-02 DIAGNOSIS — D631 Anemia in chronic kidney disease: Secondary | ICD-10-CM | POA: Diagnosis not present

## 2019-09-03 DIAGNOSIS — N186 End stage renal disease: Secondary | ICD-10-CM | POA: Diagnosis not present

## 2019-09-03 DIAGNOSIS — D631 Anemia in chronic kidney disease: Secondary | ICD-10-CM | POA: Diagnosis not present

## 2019-09-03 DIAGNOSIS — Z992 Dependence on renal dialysis: Secondary | ICD-10-CM | POA: Diagnosis not present

## 2019-09-03 DIAGNOSIS — D509 Iron deficiency anemia, unspecified: Secondary | ICD-10-CM | POA: Diagnosis not present

## 2019-09-04 DIAGNOSIS — D509 Iron deficiency anemia, unspecified: Secondary | ICD-10-CM | POA: Diagnosis not present

## 2019-09-04 DIAGNOSIS — Z992 Dependence on renal dialysis: Secondary | ICD-10-CM | POA: Diagnosis not present

## 2019-09-04 DIAGNOSIS — D631 Anemia in chronic kidney disease: Secondary | ICD-10-CM | POA: Diagnosis not present

## 2019-09-04 DIAGNOSIS — N186 End stage renal disease: Secondary | ICD-10-CM | POA: Diagnosis not present

## 2019-09-05 DIAGNOSIS — D509 Iron deficiency anemia, unspecified: Secondary | ICD-10-CM | POA: Diagnosis not present

## 2019-09-05 DIAGNOSIS — D631 Anemia in chronic kidney disease: Secondary | ICD-10-CM | POA: Diagnosis not present

## 2019-09-05 DIAGNOSIS — N186 End stage renal disease: Secondary | ICD-10-CM | POA: Diagnosis not present

## 2019-09-05 DIAGNOSIS — Z992 Dependence on renal dialysis: Secondary | ICD-10-CM | POA: Diagnosis not present

## 2019-09-06 DIAGNOSIS — D509 Iron deficiency anemia, unspecified: Secondary | ICD-10-CM | POA: Diagnosis not present

## 2019-09-06 DIAGNOSIS — Z992 Dependence on renal dialysis: Secondary | ICD-10-CM | POA: Diagnosis not present

## 2019-09-06 DIAGNOSIS — D631 Anemia in chronic kidney disease: Secondary | ICD-10-CM | POA: Diagnosis not present

## 2019-09-06 DIAGNOSIS — N186 End stage renal disease: Secondary | ICD-10-CM | POA: Diagnosis not present

## 2019-09-07 DIAGNOSIS — Z992 Dependence on renal dialysis: Secondary | ICD-10-CM | POA: Diagnosis not present

## 2019-09-07 DIAGNOSIS — N186 End stage renal disease: Secondary | ICD-10-CM | POA: Diagnosis not present

## 2019-09-07 DIAGNOSIS — D631 Anemia in chronic kidney disease: Secondary | ICD-10-CM | POA: Diagnosis not present

## 2019-09-07 DIAGNOSIS — D509 Iron deficiency anemia, unspecified: Secondary | ICD-10-CM | POA: Diagnosis not present

## 2019-09-08 DIAGNOSIS — D509 Iron deficiency anemia, unspecified: Secondary | ICD-10-CM | POA: Diagnosis not present

## 2019-09-08 DIAGNOSIS — N186 End stage renal disease: Secondary | ICD-10-CM | POA: Diagnosis not present

## 2019-09-08 DIAGNOSIS — D631 Anemia in chronic kidney disease: Secondary | ICD-10-CM | POA: Diagnosis not present

## 2019-09-08 DIAGNOSIS — Z992 Dependence on renal dialysis: Secondary | ICD-10-CM | POA: Diagnosis not present

## 2019-09-09 DIAGNOSIS — D631 Anemia in chronic kidney disease: Secondary | ICD-10-CM | POA: Diagnosis not present

## 2019-09-09 DIAGNOSIS — D509 Iron deficiency anemia, unspecified: Secondary | ICD-10-CM | POA: Diagnosis not present

## 2019-09-09 DIAGNOSIS — Z992 Dependence on renal dialysis: Secondary | ICD-10-CM | POA: Diagnosis not present

## 2019-09-09 DIAGNOSIS — N186 End stage renal disease: Secondary | ICD-10-CM | POA: Diagnosis not present

## 2019-09-10 DIAGNOSIS — D509 Iron deficiency anemia, unspecified: Secondary | ICD-10-CM | POA: Diagnosis not present

## 2019-09-10 DIAGNOSIS — D631 Anemia in chronic kidney disease: Secondary | ICD-10-CM | POA: Diagnosis not present

## 2019-09-10 DIAGNOSIS — N186 End stage renal disease: Secondary | ICD-10-CM | POA: Diagnosis not present

## 2019-09-10 DIAGNOSIS — Z992 Dependence on renal dialysis: Secondary | ICD-10-CM | POA: Diagnosis not present

## 2019-09-11 DIAGNOSIS — D631 Anemia in chronic kidney disease: Secondary | ICD-10-CM | POA: Diagnosis not present

## 2019-09-11 DIAGNOSIS — D509 Iron deficiency anemia, unspecified: Secondary | ICD-10-CM | POA: Diagnosis not present

## 2019-09-11 DIAGNOSIS — N186 End stage renal disease: Secondary | ICD-10-CM | POA: Diagnosis not present

## 2019-09-11 DIAGNOSIS — Z992 Dependence on renal dialysis: Secondary | ICD-10-CM | POA: Diagnosis not present

## 2019-09-12 DIAGNOSIS — Z992 Dependence on renal dialysis: Secondary | ICD-10-CM | POA: Diagnosis not present

## 2019-09-12 DIAGNOSIS — D631 Anemia in chronic kidney disease: Secondary | ICD-10-CM | POA: Diagnosis not present

## 2019-09-12 DIAGNOSIS — N186 End stage renal disease: Secondary | ICD-10-CM | POA: Diagnosis not present

## 2019-09-12 DIAGNOSIS — D509 Iron deficiency anemia, unspecified: Secondary | ICD-10-CM | POA: Diagnosis not present

## 2019-09-13 DIAGNOSIS — N186 End stage renal disease: Secondary | ICD-10-CM | POA: Diagnosis not present

## 2019-09-13 DIAGNOSIS — Z992 Dependence on renal dialysis: Secondary | ICD-10-CM | POA: Diagnosis not present

## 2019-09-13 DIAGNOSIS — D509 Iron deficiency anemia, unspecified: Secondary | ICD-10-CM | POA: Diagnosis not present

## 2019-09-13 DIAGNOSIS — D631 Anemia in chronic kidney disease: Secondary | ICD-10-CM | POA: Diagnosis not present

## 2019-09-14 ENCOUNTER — Other Ambulatory Visit: Payer: Self-pay | Admitting: Family Medicine

## 2019-09-14 DIAGNOSIS — N186 End stage renal disease: Secondary | ICD-10-CM | POA: Diagnosis not present

## 2019-09-14 DIAGNOSIS — G8929 Other chronic pain: Secondary | ICD-10-CM

## 2019-09-14 DIAGNOSIS — D509 Iron deficiency anemia, unspecified: Secondary | ICD-10-CM | POA: Diagnosis not present

## 2019-09-14 DIAGNOSIS — Z992 Dependence on renal dialysis: Secondary | ICD-10-CM | POA: Diagnosis not present

## 2019-09-14 DIAGNOSIS — D631 Anemia in chronic kidney disease: Secondary | ICD-10-CM | POA: Diagnosis not present

## 2019-09-14 MED ORDER — BUTALBITAL-APAP-CAFFEINE 50-325-40 MG PO TABS: ORAL_TABLET | ORAL | 3 refills | Status: DC

## 2019-09-14 NOTE — Telephone Encounter (Signed)
Requested medication (s) are due for refill today - patient requesting RF- refused too soon- patient called back  Requested medication (s) are on the active medication list -yes  Future visit scheduled -yes  Last refill: 2 months ago  Notes to clinic: Request for non delegated Rx- refused too soon- patient called back-sent to office for review   Requested Prescriptions  Pending Prescriptions Disp Refills   butalbital-acetaminophen-caffeine (FIORICET) 50-325-40 MG tablet 150 tablet 3      Not Delegated - Analgesics:  Non-Opioid Analgesic Combinations Failed - 09/14/2019  9:36 AM      Failed - This refill cannot be delegated      Passed - Valid encounter within last 12 months    Recent Outpatient Visits           4 months ago Typical atrial flutter [facility])   [facility] [name], [facility]   10 months ago CKD (chronic kidney disease) stage 5, GFR less than 15 ml/min [facility])   [place], Dionne Bucy, [place]   10 months ago Cellulitis of lower extremity, unspecified laterality   [facility] [name], MD   11 months ago Cellulitis of lower extremity, unspecified laterality   [facility] Fenton Malling M, [place]   11 months ago Leg swelling   [facility] [name], MD                  Requested Prescriptions  Pending Prescriptions Disp Refills   butalbital-acetaminophen-caffeine (FIORICET) 50-325-40 MG tablet 150 tablet 3      Not Delegated - Analgesics:  Non-Opioid Analgesic Combinations Failed - 09/14/2019  9:36 AM      Failed - This refill cannot be delegated      Passed - Valid encounter within last 12 months    Recent Outpatient Visits           4 months ago Typical atrial flutter [facility])   [facility] [name], MD   10 months ago CKD (chronic kidney disease) stage 5, GFR less than 15 ml/min [facility])   [facility] [place], Dionne Bucy,  MD   10 months ago Cellulitis of lower extremity, unspecified laterality   [facility] [name], MD   11 months ago Cellulitis of lower extremity, unspecified laterality   [facility] Fenton Malling M, [place]   11 months ago Leg swelling   [facility] [name], MD

## 2019-09-14 NOTE — Telephone Encounter (Signed)
PT need a refill on butalbital-acetaminophen-caffeine (FIORICET) 50-325-40 MG tablet /  [facility] [place], [place] - [address] [place] Phone:  [phone]  Fax:  [phone]

## 2019-09-15 DIAGNOSIS — D509 Iron deficiency anemia, unspecified: Secondary | ICD-10-CM | POA: Diagnosis not present

## 2019-09-15 DIAGNOSIS — D631 Anemia in chronic kidney disease: Secondary | ICD-10-CM | POA: Diagnosis not present

## 2019-09-15 DIAGNOSIS — N186 End stage renal disease: Secondary | ICD-10-CM | POA: Diagnosis not present

## 2019-09-15 DIAGNOSIS — Z992 Dependence on renal dialysis: Secondary | ICD-10-CM | POA: Diagnosis not present

## 2019-09-16 DIAGNOSIS — D631 Anemia in chronic kidney disease: Secondary | ICD-10-CM | POA: Diagnosis not present

## 2019-09-16 DIAGNOSIS — N186 End stage renal disease: Secondary | ICD-10-CM | POA: Diagnosis not present

## 2019-09-16 DIAGNOSIS — D509 Iron deficiency anemia, unspecified: Secondary | ICD-10-CM | POA: Diagnosis not present

## 2019-09-16 DIAGNOSIS — Z992 Dependence on renal dialysis: Secondary | ICD-10-CM | POA: Diagnosis not present

## 2019-09-17 DIAGNOSIS — D509 Iron deficiency anemia, unspecified: Secondary | ICD-10-CM | POA: Diagnosis not present

## 2019-09-17 DIAGNOSIS — D631 Anemia in chronic kidney disease: Secondary | ICD-10-CM | POA: Diagnosis not present

## 2019-09-17 DIAGNOSIS — N186 End stage renal disease: Secondary | ICD-10-CM | POA: Diagnosis not present

## 2019-09-17 DIAGNOSIS — Z992 Dependence on renal dialysis: Secondary | ICD-10-CM | POA: Diagnosis not present

## 2019-09-18 DIAGNOSIS — N186 End stage renal disease: Secondary | ICD-10-CM | POA: Diagnosis not present

## 2019-09-18 DIAGNOSIS — D631 Anemia in chronic kidney disease: Secondary | ICD-10-CM | POA: Diagnosis not present

## 2019-09-18 DIAGNOSIS — D509 Iron deficiency anemia, unspecified: Secondary | ICD-10-CM | POA: Diagnosis not present

## 2019-09-18 DIAGNOSIS — Z992 Dependence on renal dialysis: Secondary | ICD-10-CM | POA: Diagnosis not present

## 2019-09-19 DIAGNOSIS — D631 Anemia in chronic kidney disease: Secondary | ICD-10-CM | POA: Diagnosis not present

## 2019-09-19 DIAGNOSIS — D509 Iron deficiency anemia, unspecified: Secondary | ICD-10-CM | POA: Diagnosis not present

## 2019-09-19 DIAGNOSIS — Z992 Dependence on renal dialysis: Secondary | ICD-10-CM | POA: Diagnosis not present

## 2019-09-19 DIAGNOSIS — N186 End stage renal disease: Secondary | ICD-10-CM | POA: Diagnosis not present

## 2019-09-20 DIAGNOSIS — D509 Iron deficiency anemia, unspecified: Secondary | ICD-10-CM | POA: Diagnosis not present

## 2019-09-20 DIAGNOSIS — D631 Anemia in chronic kidney disease: Secondary | ICD-10-CM | POA: Diagnosis not present

## 2019-09-20 DIAGNOSIS — N186 End stage renal disease: Secondary | ICD-10-CM | POA: Diagnosis not present

## 2019-09-20 DIAGNOSIS — Z992 Dependence on renal dialysis: Secondary | ICD-10-CM | POA: Diagnosis not present

## 2019-09-21 DIAGNOSIS — Z992 Dependence on renal dialysis: Secondary | ICD-10-CM | POA: Diagnosis not present

## 2019-09-21 DIAGNOSIS — N186 End stage renal disease: Secondary | ICD-10-CM | POA: Diagnosis not present

## 2019-09-21 DIAGNOSIS — D509 Iron deficiency anemia, unspecified: Secondary | ICD-10-CM | POA: Diagnosis not present

## 2019-09-21 DIAGNOSIS — D631 Anemia in chronic kidney disease: Secondary | ICD-10-CM | POA: Diagnosis not present

## 2019-09-22 DIAGNOSIS — N186 End stage renal disease: Secondary | ICD-10-CM | POA: Diagnosis not present

## 2019-09-22 DIAGNOSIS — D509 Iron deficiency anemia, unspecified: Secondary | ICD-10-CM | POA: Diagnosis not present

## 2019-09-22 DIAGNOSIS — Z992 Dependence on renal dialysis: Secondary | ICD-10-CM | POA: Diagnosis not present

## 2019-09-22 DIAGNOSIS — D631 Anemia in chronic kidney disease: Secondary | ICD-10-CM | POA: Diagnosis not present

## 2019-09-23 DIAGNOSIS — Z992 Dependence on renal dialysis: Secondary | ICD-10-CM | POA: Diagnosis not present

## 2019-09-23 DIAGNOSIS — D509 Iron deficiency anemia, unspecified: Secondary | ICD-10-CM | POA: Diagnosis not present

## 2019-09-23 DIAGNOSIS — N186 End stage renal disease: Secondary | ICD-10-CM | POA: Diagnosis not present

## 2019-09-23 DIAGNOSIS — D631 Anemia in chronic kidney disease: Secondary | ICD-10-CM | POA: Diagnosis not present

## 2019-09-24 DIAGNOSIS — N186 End stage renal disease: Secondary | ICD-10-CM | POA: Diagnosis not present

## 2019-09-24 DIAGNOSIS — D509 Iron deficiency anemia, unspecified: Secondary | ICD-10-CM | POA: Diagnosis not present

## 2019-09-24 DIAGNOSIS — Z992 Dependence on renal dialysis: Secondary | ICD-10-CM | POA: Diagnosis not present

## 2019-09-24 DIAGNOSIS — D631 Anemia in chronic kidney disease: Secondary | ICD-10-CM | POA: Diagnosis not present

## 2019-09-25 DIAGNOSIS — D509 Iron deficiency anemia, unspecified: Secondary | ICD-10-CM | POA: Diagnosis not present

## 2019-09-25 DIAGNOSIS — N186 End stage renal disease: Secondary | ICD-10-CM | POA: Diagnosis not present

## 2019-09-25 DIAGNOSIS — Z992 Dependence on renal dialysis: Secondary | ICD-10-CM | POA: Diagnosis not present

## 2019-09-25 DIAGNOSIS — D631 Anemia in chronic kidney disease: Secondary | ICD-10-CM | POA: Diagnosis not present

## 2019-09-26 DIAGNOSIS — N186 End stage renal disease: Secondary | ICD-10-CM | POA: Diagnosis not present

## 2019-09-26 DIAGNOSIS — Z992 Dependence on renal dialysis: Secondary | ICD-10-CM | POA: Diagnosis not present

## 2019-09-26 DIAGNOSIS — D509 Iron deficiency anemia, unspecified: Secondary | ICD-10-CM | POA: Diagnosis not present

## 2019-09-26 DIAGNOSIS — D631 Anemia in chronic kidney disease: Secondary | ICD-10-CM | POA: Diagnosis not present

## 2019-09-27 DIAGNOSIS — D631 Anemia in chronic kidney disease: Secondary | ICD-10-CM | POA: Diagnosis not present

## 2019-09-27 DIAGNOSIS — Z992 Dependence on renal dialysis: Secondary | ICD-10-CM | POA: Diagnosis not present

## 2019-09-27 DIAGNOSIS — D509 Iron deficiency anemia, unspecified: Secondary | ICD-10-CM | POA: Diagnosis not present

## 2019-09-27 DIAGNOSIS — N186 End stage renal disease: Secondary | ICD-10-CM | POA: Diagnosis not present

## 2019-09-28 DIAGNOSIS — N186 End stage renal disease: Secondary | ICD-10-CM | POA: Diagnosis not present

## 2019-09-28 DIAGNOSIS — Z992 Dependence on renal dialysis: Secondary | ICD-10-CM | POA: Diagnosis not present

## 2019-09-28 DIAGNOSIS — D631 Anemia in chronic kidney disease: Secondary | ICD-10-CM | POA: Diagnosis not present

## 2019-09-28 DIAGNOSIS — D509 Iron deficiency anemia, unspecified: Secondary | ICD-10-CM | POA: Diagnosis not present

## 2019-09-29 DIAGNOSIS — N186 End stage renal disease: Secondary | ICD-10-CM | POA: Diagnosis not present

## 2019-09-29 DIAGNOSIS — D631 Anemia in chronic kidney disease: Secondary | ICD-10-CM | POA: Diagnosis not present

## 2019-09-29 DIAGNOSIS — Z992 Dependence on renal dialysis: Secondary | ICD-10-CM | POA: Diagnosis not present

## 2019-09-29 DIAGNOSIS — D509 Iron deficiency anemia, unspecified: Secondary | ICD-10-CM | POA: Diagnosis not present

## 2019-09-30 DIAGNOSIS — N186 End stage renal disease: Secondary | ICD-10-CM | POA: Diagnosis not present

## 2019-09-30 DIAGNOSIS — D509 Iron deficiency anemia, unspecified: Secondary | ICD-10-CM | POA: Diagnosis not present

## 2019-09-30 DIAGNOSIS — D631 Anemia in chronic kidney disease: Secondary | ICD-10-CM | POA: Diagnosis not present

## 2019-09-30 DIAGNOSIS — Z992 Dependence on renal dialysis: Secondary | ICD-10-CM | POA: Diagnosis not present

## 2019-10-01 DIAGNOSIS — Z992 Dependence on renal dialysis: Secondary | ICD-10-CM | POA: Diagnosis not present

## 2019-10-01 DIAGNOSIS — D509 Iron deficiency anemia, unspecified: Secondary | ICD-10-CM | POA: Diagnosis not present

## 2019-10-01 DIAGNOSIS — N186 End stage renal disease: Secondary | ICD-10-CM | POA: Diagnosis not present

## 2019-10-01 DIAGNOSIS — D631 Anemia in chronic kidney disease: Secondary | ICD-10-CM | POA: Diagnosis not present

## 2019-10-04 DIAGNOSIS — Z992 Dependence on renal dialysis: Secondary | ICD-10-CM | POA: Diagnosis not present

## 2019-10-06 ENCOUNTER — Other Ambulatory Visit: Payer: Self-pay | Admitting: Family Medicine

## 2019-10-06 DIAGNOSIS — M7989 Other specified soft tissue disorders: Secondary | ICD-10-CM

## 2019-10-06 NOTE — Telephone Encounter (Signed)
Requested medication (s) are due for refill today: disp not specified  Requested medication (s) are on the active medication list: no  Last refill:  10/06/18  Future visit scheduled: no  Notes to clinic:  prescription expired 11/08/18   Requested Prescriptions  Pending Prescriptions Disp Refills   metolazone (ZAROXOLYN) 2.5 MG tablet [Pharmacy Med Name: METOLAZONE 2.5MG  TABLETS] 20 tablet 1    Sig: TAKE 1 TABLET BY MOUTH DAILY AS NEEDED( FOR SWELLING, NO MORE THAN 3 DAYS A WEEK)      Cardiovascular:  Diuretics - Thiazide - metolazone Failed - 10/06/2019 11:18 AM      Failed - K in normal range and within 360 days    Potassium  Date Value Ref Range Status  04/06/2019 3.3 (L) 3.5 - 5.1 mmol/L Final          Failed - Ca in normal range and within 360 days    Calcium  Date Value Ref Range Status  04/06/2019 8.6 (L) 8.9 - 10.3 mg/dL Final          Passed - Na in normal range and within 360 days    Sodium  Date Value Ref Range Status  04/06/2019 136 135 - 145 mmol/L Final  11/08/2018 135 134 - 144 mmol/L Final          Passed - Last BP in normal range    BP Readings from Last 1 Encounters:  08/16/19 119/63          Passed - Valid encounter within last 6 months    Recent Outpatient Visits           5 months ago Typical atrial flutter [facility])   [facility] [name], MD   11 months ago CKD (chronic kidney disease) stage 5, GFR less than 15 ml/min [facility])   [facility] [place], Dionne Bucy, MD   11 months ago Cellulitis of lower extremity, unspecified laterality   [facility] [name], MD   11 months ago Cellulitis of lower extremity, unspecified laterality   [facility] [place], Clearnce Sorrel, [place]   1 year ago Leg swelling   [facility] [name], MD

## 2019-10-08 DIAGNOSIS — M1711 Unilateral primary osteoarthritis, right knee: Secondary | ICD-10-CM | POA: Diagnosis not present

## 2019-10-09 ENCOUNTER — Encounter: Payer: Medicare Other | Admitting: Family Medicine

## 2019-10-15 DIAGNOSIS — M1711 Unilateral primary osteoarthritis, right knee: Secondary | ICD-10-CM | POA: Diagnosis not present

## 2019-10-25 ENCOUNTER — Other Ambulatory Visit: Payer: Self-pay | Admitting: Family Medicine

## 2019-10-25 NOTE — Telephone Encounter (Signed)
Requested  medications are  due for refill today yes  Requested medications are on the active medication list yes  Last refill 3/15  Future visit scheduled yes  Notes to clinic Historical provider

## 2019-11-01 DIAGNOSIS — Z992 Dependence on renal dialysis: Secondary | ICD-10-CM | POA: Diagnosis not present

## 2019-11-01 DIAGNOSIS — D509 Iron deficiency anemia, unspecified: Secondary | ICD-10-CM | POA: Diagnosis not present

## 2019-11-01 DIAGNOSIS — D631 Anemia in chronic kidney disease: Secondary | ICD-10-CM | POA: Diagnosis not present

## 2019-11-01 DIAGNOSIS — N2581 Secondary hyperparathyroidism of renal origin: Secondary | ICD-10-CM | POA: Diagnosis not present

## 2019-11-01 DIAGNOSIS — N186 End stage renal disease: Secondary | ICD-10-CM | POA: Diagnosis not present

## 2019-11-02 DIAGNOSIS — N186 End stage renal disease: Secondary | ICD-10-CM | POA: Diagnosis not present

## 2019-11-02 DIAGNOSIS — D509 Iron deficiency anemia, unspecified: Secondary | ICD-10-CM | POA: Diagnosis not present

## 2019-11-02 DIAGNOSIS — N2581 Secondary hyperparathyroidism of renal origin: Secondary | ICD-10-CM | POA: Diagnosis not present

## 2019-11-02 DIAGNOSIS — D631 Anemia in chronic kidney disease: Secondary | ICD-10-CM | POA: Diagnosis not present

## 2019-11-02 DIAGNOSIS — Z992 Dependence on renal dialysis: Secondary | ICD-10-CM | POA: Diagnosis not present

## 2019-11-03 DIAGNOSIS — N2581 Secondary hyperparathyroidism of renal origin: Secondary | ICD-10-CM | POA: Diagnosis not present

## 2019-11-03 DIAGNOSIS — D509 Iron deficiency anemia, unspecified: Secondary | ICD-10-CM | POA: Diagnosis not present

## 2019-11-03 DIAGNOSIS — D631 Anemia in chronic kidney disease: Secondary | ICD-10-CM | POA: Diagnosis not present

## 2019-11-03 DIAGNOSIS — N186 End stage renal disease: Secondary | ICD-10-CM | POA: Diagnosis not present

## 2019-11-03 DIAGNOSIS — Z992 Dependence on renal dialysis: Secondary | ICD-10-CM | POA: Diagnosis not present

## 2019-11-04 DIAGNOSIS — N186 End stage renal disease: Secondary | ICD-10-CM | POA: Diagnosis not present

## 2019-11-04 DIAGNOSIS — D509 Iron deficiency anemia, unspecified: Secondary | ICD-10-CM | POA: Diagnosis not present

## 2019-11-04 DIAGNOSIS — Z992 Dependence on renal dialysis: Secondary | ICD-10-CM | POA: Diagnosis not present

## 2019-11-04 DIAGNOSIS — N2581 Secondary hyperparathyroidism of renal origin: Secondary | ICD-10-CM | POA: Diagnosis not present

## 2019-11-04 DIAGNOSIS — D631 Anemia in chronic kidney disease: Secondary | ICD-10-CM | POA: Diagnosis not present

## 2019-11-05 DIAGNOSIS — Z992 Dependence on renal dialysis: Secondary | ICD-10-CM | POA: Diagnosis not present

## 2019-11-05 DIAGNOSIS — D631 Anemia in chronic kidney disease: Secondary | ICD-10-CM | POA: Diagnosis not present

## 2019-11-05 DIAGNOSIS — N186 End stage renal disease: Secondary | ICD-10-CM | POA: Diagnosis not present

## 2019-11-05 DIAGNOSIS — D509 Iron deficiency anemia, unspecified: Secondary | ICD-10-CM | POA: Diagnosis not present

## 2019-11-05 DIAGNOSIS — N2581 Secondary hyperparathyroidism of renal origin: Secondary | ICD-10-CM | POA: Diagnosis not present

## 2019-11-06 DIAGNOSIS — N2581 Secondary hyperparathyroidism of renal origin: Secondary | ICD-10-CM | POA: Diagnosis not present

## 2019-11-06 DIAGNOSIS — N186 End stage renal disease: Secondary | ICD-10-CM | POA: Diagnosis not present

## 2019-11-06 DIAGNOSIS — D509 Iron deficiency anemia, unspecified: Secondary | ICD-10-CM | POA: Diagnosis not present

## 2019-11-06 DIAGNOSIS — D631 Anemia in chronic kidney disease: Secondary | ICD-10-CM | POA: Diagnosis not present

## 2019-11-06 DIAGNOSIS — Z992 Dependence on renal dialysis: Secondary | ICD-10-CM | POA: Diagnosis not present

## 2019-11-07 DIAGNOSIS — D509 Iron deficiency anemia, unspecified: Secondary | ICD-10-CM | POA: Diagnosis not present

## 2019-11-07 DIAGNOSIS — Z992 Dependence on renal dialysis: Secondary | ICD-10-CM | POA: Diagnosis not present

## 2019-11-07 DIAGNOSIS — D631 Anemia in chronic kidney disease: Secondary | ICD-10-CM | POA: Diagnosis not present

## 2019-11-07 DIAGNOSIS — N2581 Secondary hyperparathyroidism of renal origin: Secondary | ICD-10-CM | POA: Diagnosis not present

## 2019-11-07 DIAGNOSIS — N186 End stage renal disease: Secondary | ICD-10-CM | POA: Diagnosis not present

## 2019-11-08 DIAGNOSIS — N186 End stage renal disease: Secondary | ICD-10-CM | POA: Diagnosis not present

## 2019-11-08 DIAGNOSIS — N2581 Secondary hyperparathyroidism of renal origin: Secondary | ICD-10-CM | POA: Diagnosis not present

## 2019-11-08 DIAGNOSIS — D509 Iron deficiency anemia, unspecified: Secondary | ICD-10-CM | POA: Diagnosis not present

## 2019-11-08 DIAGNOSIS — Z992 Dependence on renal dialysis: Secondary | ICD-10-CM | POA: Diagnosis not present

## 2019-11-08 DIAGNOSIS — D631 Anemia in chronic kidney disease: Secondary | ICD-10-CM | POA: Diagnosis not present

## 2019-11-09 DIAGNOSIS — Z992 Dependence on renal dialysis: Secondary | ICD-10-CM | POA: Diagnosis not present

## 2019-11-09 DIAGNOSIS — N2581 Secondary hyperparathyroidism of renal origin: Secondary | ICD-10-CM | POA: Diagnosis not present

## 2019-11-09 DIAGNOSIS — D509 Iron deficiency anemia, unspecified: Secondary | ICD-10-CM | POA: Diagnosis not present

## 2019-11-09 DIAGNOSIS — D631 Anemia in chronic kidney disease: Secondary | ICD-10-CM | POA: Diagnosis not present

## 2019-11-09 DIAGNOSIS — N186 End stage renal disease: Secondary | ICD-10-CM | POA: Diagnosis not present

## 2019-11-10 DIAGNOSIS — D631 Anemia in chronic kidney disease: Secondary | ICD-10-CM | POA: Diagnosis not present

## 2019-11-10 DIAGNOSIS — Z992 Dependence on renal dialysis: Secondary | ICD-10-CM | POA: Diagnosis not present

## 2019-11-10 DIAGNOSIS — N186 End stage renal disease: Secondary | ICD-10-CM | POA: Diagnosis not present

## 2019-11-10 DIAGNOSIS — D509 Iron deficiency anemia, unspecified: Secondary | ICD-10-CM | POA: Diagnosis not present

## 2019-11-10 DIAGNOSIS — N2581 Secondary hyperparathyroidism of renal origin: Secondary | ICD-10-CM | POA: Diagnosis not present

## 2019-11-11 DIAGNOSIS — D631 Anemia in chronic kidney disease: Secondary | ICD-10-CM | POA: Diagnosis not present

## 2019-11-11 DIAGNOSIS — D509 Iron deficiency anemia, unspecified: Secondary | ICD-10-CM | POA: Diagnosis not present

## 2019-11-11 DIAGNOSIS — Z992 Dependence on renal dialysis: Secondary | ICD-10-CM | POA: Diagnosis not present

## 2019-11-11 DIAGNOSIS — N2581 Secondary hyperparathyroidism of renal origin: Secondary | ICD-10-CM | POA: Diagnosis not present

## 2019-11-11 DIAGNOSIS — N186 End stage renal disease: Secondary | ICD-10-CM | POA: Diagnosis not present

## 2019-11-12 DIAGNOSIS — N186 End stage renal disease: Secondary | ICD-10-CM | POA: Diagnosis not present

## 2019-11-12 DIAGNOSIS — D631 Anemia in chronic kidney disease: Secondary | ICD-10-CM | POA: Diagnosis not present

## 2019-11-12 DIAGNOSIS — D509 Iron deficiency anemia, unspecified: Secondary | ICD-10-CM | POA: Diagnosis not present

## 2019-11-12 DIAGNOSIS — N2581 Secondary hyperparathyroidism of renal origin: Secondary | ICD-10-CM | POA: Diagnosis not present

## 2019-11-12 DIAGNOSIS — Z992 Dependence on renal dialysis: Secondary | ICD-10-CM | POA: Diagnosis not present

## 2019-11-13 DIAGNOSIS — D631 Anemia in chronic kidney disease: Secondary | ICD-10-CM | POA: Diagnosis not present

## 2019-11-13 DIAGNOSIS — D509 Iron deficiency anemia, unspecified: Secondary | ICD-10-CM | POA: Diagnosis not present

## 2019-11-13 DIAGNOSIS — N2581 Secondary hyperparathyroidism of renal origin: Secondary | ICD-10-CM | POA: Diagnosis not present

## 2019-11-13 DIAGNOSIS — N186 End stage renal disease: Secondary | ICD-10-CM | POA: Diagnosis not present

## 2019-11-13 DIAGNOSIS — Z992 Dependence on renal dialysis: Secondary | ICD-10-CM | POA: Diagnosis not present

## 2019-11-14 ENCOUNTER — Other Ambulatory Visit: Payer: Self-pay | Admitting: Family Medicine

## 2019-11-14 DIAGNOSIS — G4700 Insomnia, unspecified: Secondary | ICD-10-CM

## 2019-11-14 DIAGNOSIS — D509 Iron deficiency anemia, unspecified: Secondary | ICD-10-CM | POA: Diagnosis not present

## 2019-11-14 DIAGNOSIS — D631 Anemia in chronic kidney disease: Secondary | ICD-10-CM | POA: Diagnosis not present

## 2019-11-14 DIAGNOSIS — N2581 Secondary hyperparathyroidism of renal origin: Secondary | ICD-10-CM | POA: Diagnosis not present

## 2019-11-14 DIAGNOSIS — Z992 Dependence on renal dialysis: Secondary | ICD-10-CM | POA: Diagnosis not present

## 2019-11-14 DIAGNOSIS — E7800 Pure hypercholesterolemia, unspecified: Secondary | ICD-10-CM | POA: Diagnosis not present

## 2019-11-14 DIAGNOSIS — N186 End stage renal disease: Secondary | ICD-10-CM | POA: Diagnosis not present

## 2019-11-14 NOTE — Telephone Encounter (Signed)
Requested medication (s) are due for refill today: no  Requested medication (s) are on the active medication list: yes  Last refill:  10/20/2019  Future visit scheduled: yes  Notes to clinic:  this refill cannot be delegated  Patient has a appointment coming up    Requested Prescriptions  Pending Prescriptions Disp Refills   zolpidem (AMBIEN) 10 MG tablet [Pharmacy Med Name: ZOLPIDEM 10MG  TABLETS] 30 tablet     Sig: TAKE 1/2 TO 1 TABLET(5 TO 10 MG) BY MOUTH AT BEDTIME AS NEEDED      Not Delegated - Psychiatry:  Anxiolytics/Hypnotics Failed - 11/14/2019  1:06 PM      Failed - This refill cannot be delegated      Failed - Urine Drug Screen completed in last 360 days.      Failed - Valid encounter within last 6 months    Recent Outpatient Visits           6 months ago Typical atrial flutter [facility])   [facility] [name], MD   1 year ago CKD (chronic kidney disease) stage 5, GFR less than 15 ml/min [facility])   [facility] [place], Dionne Bucy, MD   1 year ago Cellulitis of lower extremity, unspecified laterality   [facility] [name], MD   1 year ago Cellulitis of lower extremity, unspecified laterality   [facility] [place], Clearnce Sorrel, [place]   1 year ago Leg swelling   [facility] [name], MD

## 2019-11-15 DIAGNOSIS — D631 Anemia in chronic kidney disease: Secondary | ICD-10-CM | POA: Diagnosis not present

## 2019-11-15 DIAGNOSIS — D509 Iron deficiency anemia, unspecified: Secondary | ICD-10-CM | POA: Diagnosis not present

## 2019-11-15 DIAGNOSIS — N2581 Secondary hyperparathyroidism of renal origin: Secondary | ICD-10-CM | POA: Diagnosis not present

## 2019-11-15 DIAGNOSIS — Z992 Dependence on renal dialysis: Secondary | ICD-10-CM | POA: Diagnosis not present

## 2019-11-15 DIAGNOSIS — N186 End stage renal disease: Secondary | ICD-10-CM | POA: Diagnosis not present

## 2019-11-16 DIAGNOSIS — N2581 Secondary hyperparathyroidism of renal origin: Secondary | ICD-10-CM | POA: Diagnosis not present

## 2019-11-16 DIAGNOSIS — N186 End stage renal disease: Secondary | ICD-10-CM | POA: Diagnosis not present

## 2019-11-16 DIAGNOSIS — Z992 Dependence on renal dialysis: Secondary | ICD-10-CM | POA: Diagnosis not present

## 2019-11-16 DIAGNOSIS — D631 Anemia in chronic kidney disease: Secondary | ICD-10-CM | POA: Diagnosis not present

## 2019-11-16 DIAGNOSIS — D509 Iron deficiency anemia, unspecified: Secondary | ICD-10-CM | POA: Diagnosis not present

## 2019-11-17 DIAGNOSIS — D631 Anemia in chronic kidney disease: Secondary | ICD-10-CM | POA: Diagnosis not present

## 2019-11-17 DIAGNOSIS — D509 Iron deficiency anemia, unspecified: Secondary | ICD-10-CM | POA: Diagnosis not present

## 2019-11-17 DIAGNOSIS — Z992 Dependence on renal dialysis: Secondary | ICD-10-CM | POA: Diagnosis not present

## 2019-11-17 DIAGNOSIS — N186 End stage renal disease: Secondary | ICD-10-CM | POA: Diagnosis not present

## 2019-11-17 DIAGNOSIS — N2581 Secondary hyperparathyroidism of renal origin: Secondary | ICD-10-CM | POA: Diagnosis not present

## 2019-11-18 DIAGNOSIS — N2581 Secondary hyperparathyroidism of renal origin: Secondary | ICD-10-CM | POA: Diagnosis not present

## 2019-11-18 DIAGNOSIS — N186 End stage renal disease: Secondary | ICD-10-CM | POA: Diagnosis not present

## 2019-11-18 DIAGNOSIS — D509 Iron deficiency anemia, unspecified: Secondary | ICD-10-CM | POA: Diagnosis not present

## 2019-11-18 DIAGNOSIS — Z992 Dependence on renal dialysis: Secondary | ICD-10-CM | POA: Diagnosis not present

## 2019-11-18 DIAGNOSIS — D631 Anemia in chronic kidney disease: Secondary | ICD-10-CM | POA: Diagnosis not present

## 2019-11-19 DIAGNOSIS — N2581 Secondary hyperparathyroidism of renal origin: Secondary | ICD-10-CM | POA: Diagnosis not present

## 2019-11-19 DIAGNOSIS — D509 Iron deficiency anemia, unspecified: Secondary | ICD-10-CM | POA: Diagnosis not present

## 2019-11-19 DIAGNOSIS — D631 Anemia in chronic kidney disease: Secondary | ICD-10-CM | POA: Diagnosis not present

## 2019-11-19 DIAGNOSIS — Z992 Dependence on renal dialysis: Secondary | ICD-10-CM | POA: Diagnosis not present

## 2019-11-19 DIAGNOSIS — N186 End stage renal disease: Secondary | ICD-10-CM | POA: Diagnosis not present

## 2019-11-20 DIAGNOSIS — N186 End stage renal disease: Secondary | ICD-10-CM | POA: Diagnosis not present

## 2019-11-20 DIAGNOSIS — D509 Iron deficiency anemia, unspecified: Secondary | ICD-10-CM | POA: Diagnosis not present

## 2019-11-20 DIAGNOSIS — Z992 Dependence on renal dialysis: Secondary | ICD-10-CM | POA: Diagnosis not present

## 2019-11-20 DIAGNOSIS — N2581 Secondary hyperparathyroidism of renal origin: Secondary | ICD-10-CM | POA: Diagnosis not present

## 2019-11-20 DIAGNOSIS — D631 Anemia in chronic kidney disease: Secondary | ICD-10-CM | POA: Diagnosis not present

## 2019-11-21 DIAGNOSIS — N2581 Secondary hyperparathyroidism of renal origin: Secondary | ICD-10-CM | POA: Diagnosis not present

## 2019-11-21 DIAGNOSIS — D509 Iron deficiency anemia, unspecified: Secondary | ICD-10-CM | POA: Diagnosis not present

## 2019-11-21 DIAGNOSIS — N186 End stage renal disease: Secondary | ICD-10-CM | POA: Diagnosis not present

## 2019-11-21 DIAGNOSIS — Z992 Dependence on renal dialysis: Secondary | ICD-10-CM | POA: Diagnosis not present

## 2019-11-21 DIAGNOSIS — D631 Anemia in chronic kidney disease: Secondary | ICD-10-CM | POA: Diagnosis not present

## 2019-11-22 DIAGNOSIS — D631 Anemia in chronic kidney disease: Secondary | ICD-10-CM | POA: Diagnosis not present

## 2019-11-22 DIAGNOSIS — D509 Iron deficiency anemia, unspecified: Secondary | ICD-10-CM | POA: Diagnosis not present

## 2019-11-22 DIAGNOSIS — N2581 Secondary hyperparathyroidism of renal origin: Secondary | ICD-10-CM | POA: Diagnosis not present

## 2019-11-22 DIAGNOSIS — N186 End stage renal disease: Secondary | ICD-10-CM | POA: Diagnosis not present

## 2019-11-22 DIAGNOSIS — Z992 Dependence on renal dialysis: Secondary | ICD-10-CM | POA: Diagnosis not present

## 2019-11-23 ENCOUNTER — Other Ambulatory Visit: Payer: Self-pay | Admitting: Family Medicine

## 2019-11-23 DIAGNOSIS — Z992 Dependence on renal dialysis: Secondary | ICD-10-CM | POA: Diagnosis not present

## 2019-11-23 DIAGNOSIS — N2581 Secondary hyperparathyroidism of renal origin: Secondary | ICD-10-CM | POA: Diagnosis not present

## 2019-11-23 DIAGNOSIS — D509 Iron deficiency anemia, unspecified: Secondary | ICD-10-CM | POA: Diagnosis not present

## 2019-11-23 DIAGNOSIS — D631 Anemia in chronic kidney disease: Secondary | ICD-10-CM | POA: Diagnosis not present

## 2019-11-23 DIAGNOSIS — N186 End stage renal disease: Secondary | ICD-10-CM | POA: Diagnosis not present

## 2019-11-23 DIAGNOSIS — R519 Headache, unspecified: Secondary | ICD-10-CM

## 2019-11-23 NOTE — Telephone Encounter (Signed)
Requested medication (s) are due for refill today: yes  Requested medication (s) are on the active medication list: yes  Last refill:  11/09/2019  Future visit scheduled: yes  Notes to clinic:  this refill cannot be delegated    Requested Prescriptions  Pending Prescriptions Disp Refills   butalbital-acetaminophen-caffeine (FIORICET) 50-325-40 MG tablet [Pharmacy Med Name: BUTALBITAL/ACETAMINOPHEN/CAFF TABS] 150 tablet     Sig: TAKE 1 TO 2 TABLETS BY MOUTH EVERY 4 HOURS AS NEEDED FOR HEADACHE      Not Delegated - Analgesics:  Non-Opioid Analgesic Combinations Failed - 11/23/2019  7:54 AM      Failed - This refill cannot be delegated      Passed - Valid encounter within last 12 months    Recent Outpatient Visits           6 months ago Typical atrial flutter [facility])   [facility] [name], MD   1 year ago CKD (chronic kidney disease) stage 5, GFR less than 15 ml/min [facility])   [facility] [place], Dionne Bucy, MD   1 year ago Cellulitis of lower extremity, unspecified laterality   [facility] [name], MD   1 year ago Cellulitis of lower extremity, unspecified laterality   [facility] [place], Clearnce Sorrel, [place]   1 year ago Leg swelling   [facility] [name], MD

## 2019-11-24 DIAGNOSIS — Z992 Dependence on renal dialysis: Secondary | ICD-10-CM | POA: Diagnosis not present

## 2019-11-24 DIAGNOSIS — D631 Anemia in chronic kidney disease: Secondary | ICD-10-CM | POA: Diagnosis not present

## 2019-11-24 DIAGNOSIS — N2581 Secondary hyperparathyroidism of renal origin: Secondary | ICD-10-CM | POA: Diagnosis not present

## 2019-11-24 DIAGNOSIS — N186 End stage renal disease: Secondary | ICD-10-CM | POA: Diagnosis not present

## 2019-11-24 DIAGNOSIS — D509 Iron deficiency anemia, unspecified: Secondary | ICD-10-CM | POA: Diagnosis not present

## 2019-11-25 DIAGNOSIS — N186 End stage renal disease: Secondary | ICD-10-CM | POA: Diagnosis not present

## 2019-11-25 DIAGNOSIS — D631 Anemia in chronic kidney disease: Secondary | ICD-10-CM | POA: Diagnosis not present

## 2019-11-25 DIAGNOSIS — N2581 Secondary hyperparathyroidism of renal origin: Secondary | ICD-10-CM | POA: Diagnosis not present

## 2019-11-25 DIAGNOSIS — D509 Iron deficiency anemia, unspecified: Secondary | ICD-10-CM | POA: Diagnosis not present

## 2019-11-25 DIAGNOSIS — Z992 Dependence on renal dialysis: Secondary | ICD-10-CM | POA: Diagnosis not present

## 2019-11-26 DIAGNOSIS — Z992 Dependence on renal dialysis: Secondary | ICD-10-CM | POA: Diagnosis not present

## 2019-11-26 DIAGNOSIS — N2581 Secondary hyperparathyroidism of renal origin: Secondary | ICD-10-CM | POA: Diagnosis not present

## 2019-11-26 DIAGNOSIS — N186 End stage renal disease: Secondary | ICD-10-CM | POA: Diagnosis not present

## 2019-11-26 DIAGNOSIS — D509 Iron deficiency anemia, unspecified: Secondary | ICD-10-CM | POA: Diagnosis not present

## 2019-11-26 DIAGNOSIS — D631 Anemia in chronic kidney disease: Secondary | ICD-10-CM | POA: Diagnosis not present

## 2019-11-27 DIAGNOSIS — D509 Iron deficiency anemia, unspecified: Secondary | ICD-10-CM | POA: Diagnosis not present

## 2019-11-27 DIAGNOSIS — D631 Anemia in chronic kidney disease: Secondary | ICD-10-CM | POA: Diagnosis not present

## 2019-11-27 DIAGNOSIS — N2581 Secondary hyperparathyroidism of renal origin: Secondary | ICD-10-CM | POA: Diagnosis not present

## 2019-11-27 DIAGNOSIS — N186 End stage renal disease: Secondary | ICD-10-CM | POA: Diagnosis not present

## 2019-11-27 DIAGNOSIS — Z992 Dependence on renal dialysis: Secondary | ICD-10-CM | POA: Diagnosis not present

## 2019-11-28 DIAGNOSIS — N2581 Secondary hyperparathyroidism of renal origin: Secondary | ICD-10-CM | POA: Diagnosis not present

## 2019-11-28 DIAGNOSIS — D631 Anemia in chronic kidney disease: Secondary | ICD-10-CM | POA: Diagnosis not present

## 2019-11-28 DIAGNOSIS — D509 Iron deficiency anemia, unspecified: Secondary | ICD-10-CM | POA: Diagnosis not present

## 2019-11-28 DIAGNOSIS — N186 End stage renal disease: Secondary | ICD-10-CM | POA: Diagnosis not present

## 2019-11-28 DIAGNOSIS — Z992 Dependence on renal dialysis: Secondary | ICD-10-CM | POA: Diagnosis not present

## 2019-11-29 DIAGNOSIS — N186 End stage renal disease: Secondary | ICD-10-CM | POA: Diagnosis not present

## 2019-11-29 DIAGNOSIS — N2581 Secondary hyperparathyroidism of renal origin: Secondary | ICD-10-CM | POA: Diagnosis not present

## 2019-11-29 DIAGNOSIS — D631 Anemia in chronic kidney disease: Secondary | ICD-10-CM | POA: Diagnosis not present

## 2019-11-29 DIAGNOSIS — Z992 Dependence on renal dialysis: Secondary | ICD-10-CM | POA: Diagnosis not present

## 2019-11-29 DIAGNOSIS — D509 Iron deficiency anemia, unspecified: Secondary | ICD-10-CM | POA: Diagnosis not present

## 2019-11-30 DIAGNOSIS — N186 End stage renal disease: Secondary | ICD-10-CM | POA: Diagnosis not present

## 2019-11-30 DIAGNOSIS — Z992 Dependence on renal dialysis: Secondary | ICD-10-CM | POA: Diagnosis not present

## 2019-11-30 DIAGNOSIS — D631 Anemia in chronic kidney disease: Secondary | ICD-10-CM | POA: Diagnosis not present

## 2019-11-30 DIAGNOSIS — D509 Iron deficiency anemia, unspecified: Secondary | ICD-10-CM | POA: Diagnosis not present

## 2019-11-30 DIAGNOSIS — N2581 Secondary hyperparathyroidism of renal origin: Secondary | ICD-10-CM | POA: Diagnosis not present

## 2019-12-01 DIAGNOSIS — N186 End stage renal disease: Secondary | ICD-10-CM | POA: Diagnosis not present

## 2019-12-01 DIAGNOSIS — Z992 Dependence on renal dialysis: Secondary | ICD-10-CM | POA: Diagnosis not present

## 2019-12-01 DIAGNOSIS — N2581 Secondary hyperparathyroidism of renal origin: Secondary | ICD-10-CM | POA: Diagnosis not present

## 2019-12-01 DIAGNOSIS — D631 Anemia in chronic kidney disease: Secondary | ICD-10-CM | POA: Diagnosis not present

## 2019-12-01 DIAGNOSIS — D509 Iron deficiency anemia, unspecified: Secondary | ICD-10-CM | POA: Diagnosis not present

## 2019-12-02 DIAGNOSIS — Z992 Dependence on renal dialysis: Secondary | ICD-10-CM | POA: Diagnosis not present

## 2019-12-02 DIAGNOSIS — D631 Anemia in chronic kidney disease: Secondary | ICD-10-CM | POA: Diagnosis not present

## 2019-12-02 DIAGNOSIS — N186 End stage renal disease: Secondary | ICD-10-CM | POA: Diagnosis not present

## 2019-12-02 DIAGNOSIS — D509 Iron deficiency anemia, unspecified: Secondary | ICD-10-CM | POA: Diagnosis not present

## 2019-12-03 DIAGNOSIS — D509 Iron deficiency anemia, unspecified: Secondary | ICD-10-CM | POA: Diagnosis not present

## 2019-12-03 DIAGNOSIS — Z992 Dependence on renal dialysis: Secondary | ICD-10-CM | POA: Diagnosis not present

## 2019-12-03 DIAGNOSIS — N186 End stage renal disease: Secondary | ICD-10-CM | POA: Diagnosis not present

## 2019-12-03 DIAGNOSIS — D631 Anemia in chronic kidney disease: Secondary | ICD-10-CM | POA: Diagnosis not present

## 2019-12-04 DIAGNOSIS — Z992 Dependence on renal dialysis: Secondary | ICD-10-CM | POA: Diagnosis not present

## 2019-12-04 DIAGNOSIS — D509 Iron deficiency anemia, unspecified: Secondary | ICD-10-CM | POA: Diagnosis not present

## 2019-12-04 DIAGNOSIS — D631 Anemia in chronic kidney disease: Secondary | ICD-10-CM | POA: Diagnosis not present

## 2019-12-04 DIAGNOSIS — N186 End stage renal disease: Secondary | ICD-10-CM | POA: Diagnosis not present

## 2019-12-05 DIAGNOSIS — Z992 Dependence on renal dialysis: Secondary | ICD-10-CM | POA: Diagnosis not present

## 2019-12-05 DIAGNOSIS — N186 End stage renal disease: Secondary | ICD-10-CM | POA: Diagnosis not present

## 2019-12-05 DIAGNOSIS — D631 Anemia in chronic kidney disease: Secondary | ICD-10-CM | POA: Diagnosis not present

## 2019-12-05 DIAGNOSIS — D509 Iron deficiency anemia, unspecified: Secondary | ICD-10-CM | POA: Diagnosis not present

## 2019-12-06 DIAGNOSIS — Z992 Dependence on renal dialysis: Secondary | ICD-10-CM | POA: Diagnosis not present

## 2019-12-06 DIAGNOSIS — D631 Anemia in chronic kidney disease: Secondary | ICD-10-CM | POA: Diagnosis not present

## 2019-12-06 DIAGNOSIS — N186 End stage renal disease: Secondary | ICD-10-CM | POA: Diagnosis not present

## 2019-12-06 DIAGNOSIS — D509 Iron deficiency anemia, unspecified: Secondary | ICD-10-CM | POA: Diagnosis not present

## 2019-12-07 DIAGNOSIS — D509 Iron deficiency anemia, unspecified: Secondary | ICD-10-CM | POA: Diagnosis not present

## 2019-12-07 DIAGNOSIS — Z992 Dependence on renal dialysis: Secondary | ICD-10-CM | POA: Diagnosis not present

## 2019-12-07 DIAGNOSIS — D631 Anemia in chronic kidney disease: Secondary | ICD-10-CM | POA: Diagnosis not present

## 2019-12-07 DIAGNOSIS — N186 End stage renal disease: Secondary | ICD-10-CM | POA: Diagnosis not present

## 2019-12-08 DIAGNOSIS — D631 Anemia in chronic kidney disease: Secondary | ICD-10-CM | POA: Diagnosis not present

## 2019-12-08 DIAGNOSIS — N186 End stage renal disease: Secondary | ICD-10-CM | POA: Diagnosis not present

## 2019-12-08 DIAGNOSIS — D509 Iron deficiency anemia, unspecified: Secondary | ICD-10-CM | POA: Diagnosis not present

## 2019-12-08 DIAGNOSIS — Z992 Dependence on renal dialysis: Secondary | ICD-10-CM | POA: Diagnosis not present

## 2019-12-09 DIAGNOSIS — D509 Iron deficiency anemia, unspecified: Secondary | ICD-10-CM | POA: Diagnosis not present

## 2019-12-09 DIAGNOSIS — N186 End stage renal disease: Secondary | ICD-10-CM | POA: Diagnosis not present

## 2019-12-09 DIAGNOSIS — Z992 Dependence on renal dialysis: Secondary | ICD-10-CM | POA: Diagnosis not present

## 2019-12-09 DIAGNOSIS — D631 Anemia in chronic kidney disease: Secondary | ICD-10-CM | POA: Diagnosis not present

## 2019-12-10 DIAGNOSIS — N186 End stage renal disease: Secondary | ICD-10-CM | POA: Diagnosis not present

## 2019-12-10 DIAGNOSIS — D509 Iron deficiency anemia, unspecified: Secondary | ICD-10-CM | POA: Diagnosis not present

## 2019-12-10 DIAGNOSIS — Z992 Dependence on renal dialysis: Secondary | ICD-10-CM | POA: Diagnosis not present

## 2019-12-10 DIAGNOSIS — D631 Anemia in chronic kidney disease: Secondary | ICD-10-CM | POA: Diagnosis not present

## 2019-12-11 ENCOUNTER — Other Ambulatory Visit: Payer: Self-pay | Admitting: Family Medicine

## 2019-12-11 DIAGNOSIS — Z992 Dependence on renal dialysis: Secondary | ICD-10-CM | POA: Diagnosis not present

## 2019-12-11 DIAGNOSIS — D631 Anemia in chronic kidney disease: Secondary | ICD-10-CM | POA: Diagnosis not present

## 2019-12-11 DIAGNOSIS — D509 Iron deficiency anemia, unspecified: Secondary | ICD-10-CM | POA: Diagnosis not present

## 2019-12-11 DIAGNOSIS — N186 End stage renal disease: Secondary | ICD-10-CM | POA: Diagnosis not present

## 2019-12-11 DIAGNOSIS — G8929 Other chronic pain: Secondary | ICD-10-CM

## 2019-12-11 NOTE — Telephone Encounter (Signed)
Requested medication (s) are due for refill today:  No  Due 12/18/19    Requested medication (s) are on the active medication list: yes  Last refill: 11/23/19  #25  0 refills  Future visit scheduled  yes 02/29/20  Notes to clinic: Not delegated. Requested early via Interface  Requested Prescriptions  Pending Prescriptions Disp Refills   butalbital-acetaminophen-caffeine (FIORICET) 50-325-40 MG tablet [Pharmacy Med Name: BUTALBITAL/ACETAMINOPHEN/CAFF TABS] 150 tablet     Sig: TAKE 1 TO 2 TABLETS BY MOUTH EVERY 4 HOURS AS NEEDED FOR HEADACHE      Not Delegated - Analgesics:  Non-Opioid Analgesic Combinations Failed - 12/11/2019  9:53 AM      Failed - This refill cannot be delegated      Passed - Valid encounter within last 12 months    Recent Outpatient Visits           7 months ago Typical atrial flutter [facility])   [facility] [name], MD   1 year ago CKD (chronic kidney disease) stage 5, GFR less than 15 ml/min [facility])   [facility] [place], Dionne Bucy, MD   1 year ago Cellulitis of lower extremity, unspecified laterality   [facility] [name], MD   1 year ago Cellulitis of lower extremity, unspecified laterality   [facility] [place], Clearnce Sorrel, [place]   1 year ago Leg swelling   [facility] [name], MD

## 2019-12-12 DIAGNOSIS — N186 End stage renal disease: Secondary | ICD-10-CM | POA: Diagnosis not present

## 2019-12-12 DIAGNOSIS — D509 Iron deficiency anemia, unspecified: Secondary | ICD-10-CM | POA: Diagnosis not present

## 2019-12-12 DIAGNOSIS — Z992 Dependence on renal dialysis: Secondary | ICD-10-CM | POA: Diagnosis not present

## 2019-12-12 DIAGNOSIS — D631 Anemia in chronic kidney disease: Secondary | ICD-10-CM | POA: Diagnosis not present

## 2019-12-13 DIAGNOSIS — N186 End stage renal disease: Secondary | ICD-10-CM | POA: Diagnosis not present

## 2019-12-13 DIAGNOSIS — D509 Iron deficiency anemia, unspecified: Secondary | ICD-10-CM | POA: Diagnosis not present

## 2019-12-13 DIAGNOSIS — D631 Anemia in chronic kidney disease: Secondary | ICD-10-CM | POA: Diagnosis not present

## 2019-12-13 DIAGNOSIS — Z992 Dependence on renal dialysis: Secondary | ICD-10-CM | POA: Diagnosis not present

## 2019-12-14 DIAGNOSIS — Z992 Dependence on renal dialysis: Secondary | ICD-10-CM | POA: Diagnosis not present

## 2019-12-14 DIAGNOSIS — D509 Iron deficiency anemia, unspecified: Secondary | ICD-10-CM | POA: Diagnosis not present

## 2019-12-14 DIAGNOSIS — D631 Anemia in chronic kidney disease: Secondary | ICD-10-CM | POA: Diagnosis not present

## 2019-12-14 DIAGNOSIS — N186 End stage renal disease: Secondary | ICD-10-CM | POA: Diagnosis not present

## 2019-12-15 DIAGNOSIS — D509 Iron deficiency anemia, unspecified: Secondary | ICD-10-CM | POA: Diagnosis not present

## 2019-12-15 DIAGNOSIS — N186 End stage renal disease: Secondary | ICD-10-CM | POA: Diagnosis not present

## 2019-12-15 DIAGNOSIS — Z992 Dependence on renal dialysis: Secondary | ICD-10-CM | POA: Diagnosis not present

## 2019-12-15 DIAGNOSIS — D631 Anemia in chronic kidney disease: Secondary | ICD-10-CM | POA: Diagnosis not present

## 2019-12-16 ENCOUNTER — Observation Stay: Payer: Medicare Other

## 2019-12-16 ENCOUNTER — Observation Stay
Admission: EM | Admit: 2019-12-16 | Discharge: 2019-12-17 | Disposition: A | Discharge status: Home / Self Care | Payer: Medicare Other | Attending: Internal Medicine | Admitting: Internal Medicine

## 2019-12-16 ENCOUNTER — Other Ambulatory Visit: Payer: Self-pay

## 2019-12-16 ENCOUNTER — Emergency Department: Payer: Medicare Other

## 2019-12-16 DIAGNOSIS — Z20822 Contact with and (suspected) exposure to covid-19: Secondary | ICD-10-CM | POA: Diagnosis not present

## 2019-12-16 DIAGNOSIS — N186 End stage renal disease: Secondary | ICD-10-CM | POA: Diagnosis not present

## 2019-12-16 DIAGNOSIS — Z79899 Other long term (current) drug therapy: Secondary | ICD-10-CM | POA: Insufficient documentation

## 2019-12-16 DIAGNOSIS — I10 Essential (primary) hypertension: Secondary | ICD-10-CM | POA: Diagnosis not present

## 2019-12-16 DIAGNOSIS — R55 Syncope and collapse: Principal | ICD-10-CM | POA: Insufficient documentation

## 2019-12-16 DIAGNOSIS — F418 Other specified anxiety disorders: Secondary | ICD-10-CM | POA: Diagnosis not present

## 2019-12-16 DIAGNOSIS — E876 Hypokalemia: Secondary | ICD-10-CM | POA: Insufficient documentation

## 2019-12-16 DIAGNOSIS — I120 Hypertensive chronic kidney disease with stage 5 chronic kidney disease or end stage renal disease: Secondary | ICD-10-CM | POA: Insufficient documentation

## 2019-12-16 DIAGNOSIS — D509 Iron deficiency anemia, unspecified: Secondary | ICD-10-CM | POA: Diagnosis not present

## 2019-12-16 DIAGNOSIS — J9809 Other diseases of bronchus, not elsewhere classified: Secondary | ICD-10-CM | POA: Diagnosis not present

## 2019-12-16 DIAGNOSIS — I482 Chronic atrial fibrillation, unspecified: Secondary | ICD-10-CM | POA: Insufficient documentation

## 2019-12-16 DIAGNOSIS — I959 Hypotension, unspecified: Secondary | ICD-10-CM | POA: Insufficient documentation

## 2019-12-16 DIAGNOSIS — D631 Anemia in chronic kidney disease: Secondary | ICD-10-CM | POA: Insufficient documentation

## 2019-12-16 DIAGNOSIS — Z992 Dependence on renal dialysis: Secondary | ICD-10-CM | POA: Diagnosis not present

## 2019-12-16 DIAGNOSIS — R4020 Unspecified coma: Secondary | ICD-10-CM | POA: Diagnosis not present

## 2019-12-16 DIAGNOSIS — N2581 Secondary hyperparathyroidism of renal origin: Secondary | ICD-10-CM | POA: Diagnosis not present

## 2019-12-16 DIAGNOSIS — R42 Dizziness and giddiness: Secondary | ICD-10-CM | POA: Diagnosis not present

## 2019-12-16 LAB — CBC WITH DIFFERENTIAL/PLATELET
Abs Immature Granulocytes: 0.03 10*3/uL (ref 0.00–0.07)
Basophils Absolute: 0 10*3/uL (ref 0.0–0.1)
Basophils Relative: 1 %
Eosinophils Absolute: 1 10*3/uL — ABNORMAL HIGH (ref 0.0–0.5)
Eosinophils Relative: 14 %
HCT: 26.4 % — ABNORMAL LOW (ref 36.0–46.0)
Hemoglobin: 9 g/dL — ABNORMAL LOW (ref 12.0–15.0)
Immature Granulocytes: 0 %
Lymphocytes Relative: 16 %
Lymphs Abs: 1.1 10*3/uL (ref 0.7–4.0)
MCH: 33.5 pg (ref 26.0–34.0)
MCHC: 34.1 g/dL (ref 30.0–36.0)
MCV: 98.1 fL (ref 80.0–100.0)
Monocytes Absolute: 0.7 10*3/uL (ref 0.1–1.0)
Monocytes Relative: 10 %
Neutro Abs: 4.2 10*3/uL (ref 1.7–7.7)
Neutrophils Relative %: 59 %
Platelets: 222 10*3/uL (ref 150–400)
RBC: 2.69 MIL/uL — ABNORMAL LOW (ref 3.87–5.11)
RDW: 15.3 % (ref 11.5–15.5)
WBC: 7 10*3/uL (ref 4.0–10.5)
nRBC: 0 % (ref 0.0–0.2)

## 2019-12-16 LAB — URINALYSIS, COMPLETE (UACMP) WITH MICROSCOPIC
Bilirubin Urine: NEGATIVE
Glucose, UA: NEGATIVE mg/dL
Hgb urine dipstick: NEGATIVE
Ketones, ur: NEGATIVE mg/dL
Nitrite: NEGATIVE
Protein, ur: NEGATIVE mg/dL
Specific Gravity, Urine: 1.01 (ref 1.005–1.030)
pH: 5 (ref 5.0–8.0)

## 2019-12-16 LAB — URINE DRUG SCREEN, QUALITATIVE (ARMC ONLY)
Amphetamines, Ur Screen: NOT DETECTED
Barbiturates, Ur Screen: POSITIVE — AB
Benzodiazepine, Ur Scrn: NOT DETECTED
Cannabinoid 50 Ng, Ur ~~LOC~~: NOT DETECTED
Cocaine Metabolite,Ur ~~LOC~~: NOT DETECTED
MDMA (Ecstasy)Ur Screen: NOT DETECTED
Methadone Scn, Ur: NOT DETECTED
Opiate, Ur Screen: NOT DETECTED
Phencyclidine (PCP) Ur S: NOT DETECTED
Tricyclic, Ur Screen: NOT DETECTED

## 2019-12-16 LAB — COMPREHENSIVE METABOLIC PANEL
ALT: 12 U/L (ref 0–44)
AST: 15 U/L (ref 15–41)
Albumin: 2.6 g/dL — ABNORMAL LOW (ref 3.5–5.0)
Alkaline Phosphatase: 75 U/L (ref 38–126)
Anion gap: 15 (ref 5–15)
BUN: 43 mg/dL — ABNORMAL HIGH (ref 8–23)
CO2: 21 mmol/L — ABNORMAL LOW (ref 22–32)
Calcium: 7.1 mg/dL — ABNORMAL LOW (ref 8.9–10.3)
Chloride: 95 mmol/L — ABNORMAL LOW (ref 98–111)
Creatinine, Ser: 6.31 mg/dL — ABNORMAL HIGH (ref 0.44–1.00)
GFR calc Af Amer: 7 mL/min — ABNORMAL LOW (ref >60)
GFR calc non Af Amer: 6 mL/min — ABNORMAL LOW (ref >60)
Glucose, Bld: 105 mg/dL — ABNORMAL HIGH (ref 70–99)
Potassium: 2.3 mmol/L — CL (ref 3.5–5.1)
Sodium: 131 mmol/L — ABNORMAL LOW (ref 135–145)
Total Bilirubin: 0.6 mg/dL (ref 0.3–1.2)
Total Protein: 4.5 g/dL — ABNORMAL LOW (ref 6.5–8.1)

## 2019-12-16 LAB — LACTIC ACID, PLASMA: Lactic Acid, Venous: 1.7 mmol/L (ref 0.5–1.9)

## 2019-12-16 LAB — SARS CORONAVIRUS 2 BY RT PCR (HOSPITAL ORDER, PERFORMED IN ~~LOC~~ HOSPITAL LAB): SARS Coronavirus 2: NEGATIVE

## 2019-12-16 LAB — MAGNESIUM: Magnesium: 1.6 mg/dL — ABNORMAL LOW (ref 1.7–2.4)

## 2019-12-16 LAB — TROPONIN I (HIGH SENSITIVITY): Troponin I (High Sensitivity): 7 ng/L (ref <18)

## 2019-12-16 MED ORDER — POTASSIUM CHLORIDE CRYS ER 20 MEQ PO TBCR
40.0000 meq | EXTENDED_RELEASE_TABLET | Freq: Once | ORAL | Status: AC
  Administered 2019-12-16: 40 meq via ORAL
  Filled 2019-12-16: qty 2

## 2019-12-16 MED ORDER — BUPROPION HCL 75 MG PO TABS
75.0000 mg | ORAL_TABLET | Freq: Two times a day (BID) | ORAL | Status: DC
  Administered 2019-12-17 (×2): 75 mg via ORAL
  Filled 2019-12-16 (×3): qty 1

## 2019-12-16 MED ORDER — MAGNESIUM SULFATE 2 GM/50ML IV SOLN
2.0000 g | Freq: Once | INTRAVENOUS | Status: AC
  Administered 2019-12-16: 2 g via INTRAVENOUS
  Filled 2019-12-16: qty 50

## 2019-12-16 MED ORDER — BUTALBITAL-APAP-CAFFEINE 50-325-40 MG PO TABS
1.0000 | ORAL_TABLET | Freq: Four times a day (QID) | ORAL | Status: DC | PRN
  Filled 2019-12-16: qty 1

## 2019-12-16 MED ORDER — RENA-VITE PO TABS
1.0000 | ORAL_TABLET | Freq: Every day | ORAL | Status: DC
  Filled 2019-12-16: qty 1

## 2019-12-16 MED ORDER — HEPARIN SODIUM (PORCINE) 5000 UNIT/ML IJ SOLN
5000.0000 [IU] | Freq: Three times a day (TID) | INTRAMUSCULAR | Status: DC
  Administered 2019-12-17: 5000 [IU] via SUBCUTANEOUS
  Filled 2019-12-16 (×2): qty 1

## 2019-12-16 MED ORDER — SODIUM CHLORIDE 0.9% FLUSH
3.0000 mL | Freq: Two times a day (BID) | INTRAVENOUS | Status: DC
  Administered 2019-12-17 (×2): 3 mL via INTRAVENOUS

## 2019-12-16 MED ORDER — POTASSIUM CHLORIDE 10 MEQ/100ML IV SOLN
10.0000 meq | Freq: Once | INTRAVENOUS | Status: AC
  Administered 2019-12-16: 10 meq via INTRAVENOUS
  Filled 2019-12-16: qty 100

## 2019-12-16 MED ORDER — ONDANSETRON HCL 4 MG/2ML IJ SOLN: 4.0000 mg | Freq: Three times a day (TID) | INTRAMUSCULAR | Status: DC | PRN

## 2019-12-16 MED ORDER — CLONIDINE HCL 0.1 MG PO TABS
0.1000 mg | ORAL_TABLET | Freq: Three times a day (TID) | ORAL | Status: DC
  Administered 2019-12-17 (×2): 0.1 mg via ORAL
  Filled 2019-12-16 (×3): qty 1

## 2019-12-16 MED ORDER — PANTOPRAZOLE SODIUM 40 MG PO TBEC
40.0000 mg | DELAYED_RELEASE_TABLET | Freq: Every day | ORAL | Status: DC
  Administered 2019-12-17: 40 mg via ORAL
  Filled 2019-12-16: qty 1

## 2019-12-16 MED ORDER — LORAZEPAM 1 MG PO TABS: 1.0000 mg | ORAL_TABLET | Freq: Two times a day (BID) | ORAL | Status: DC | PRN

## 2019-12-16 MED ORDER — ZOLPIDEM TARTRATE 5 MG PO TABS
5.0000 mg | ORAL_TABLET | Freq: Every evening | ORAL | Status: DC | PRN
  Administered 2019-12-17: 5 mg via ORAL
  Filled 2019-12-16 (×2): qty 1

## 2019-12-16 MED ORDER — HYDRALAZINE HCL 20 MG/ML IJ SOLN: 5.0000 mg | INTRAMUSCULAR | Status: DC | PRN

## 2019-12-16 MED ORDER — ACETAMINOPHEN 325 MG PO TABS: 650.0000 mg | ORAL_TABLET | Freq: Four times a day (QID) | ORAL | Status: DC | PRN

## 2019-12-16 MED ORDER — CARVEDILOL 25 MG PO TABS
25.0000 mg | ORAL_TABLET | Freq: Two times a day (BID) | ORAL | Status: DC
  Administered 2019-12-17 (×2): 25 mg via ORAL
  Filled 2019-12-16 (×3): qty 1

## 2019-12-16 NOTE — ED Notes (Signed)
Lab has been called to collect Lactic Acid.

## 2019-12-16 NOTE — ED Triage Notes (Signed)
Pt arrives via ems from home, pt had a syncopal episode and is unaware that it took place, witnessed by husband, pt does peritoneal dialysis at home, no distress at this time, was found to be hypotensive at 64/25 initially, after 500 ml fluid bolus administered pt has a bp of 98/41

## 2019-12-16 NOTE — H&P (Signed)
History and Physical    JULIO STORR KZL:[id number] DOB: [birth year] DOA: 12/16/2019  Referring MD/NP/PA:   PCP: [name], MD   Patient coming from:  The patient is coming from home.  At baseline, pt is independent for most of ADL.        Chief Complaint: syncope  HPI: Amy [name] is a 69 y.o. female with medical history significant of ESRD-on peritoneal dialysis, HTN, HLD, A fib, anemia, depression, presents with syncope.   Per report, pt passed out when she was sitting at the kitchen table this AM, which was witnessed by her husband.  Patient passed out for several minutes.  Patient does not have unilateral numbness or tingling in extremities, no facial droop or slurred speech.  No vision loss or hearing loss.  Patient denies chest pain, shortness breath, fever or chills.  She has mild dry cough.  Patient states that she had nausea, vomiting and mild diarrhea last week, which has resolved.  She reports increased urinary frequency, but no dysuria or burning on urination.  She was found to be hypotensive with blood pressure 64/25.  Blood pressure improved to 98/41 after giving 500 cc normal saline. Patient does peritoneal dialysis in the evenings. She states that she had 5 L fluid removed yesterday.  ED Course: pt was found to have WBC 7.0, lactic acid 1.7, pending COVID-19 PCR, potassium 2.3, bicarbonate 21, creatinine 6.31, BUN 43, temperature normal, blood pressure improved to 110/58, heart rate 64, RR 16, oxygen saturation 99% on room air.  Patient is placed on MedSurg bed for observation.  Dr. Theador Hawthorne of nephrology is consulted.   Review of Systems:   General: no fevers, chills, no body weight gain, has fatigue HEENT: no blurry vision, hearing changes or sore throat Respiratory: no dyspnea, has mild dry coughing, no wheezing CV: no chest pain, no palpitations GI: no nausea, vomiting, abdominal pain, diarrhea, constipation GU: no dysuria, burning on urination, has increased  urinary frequency, no hematuria  Ext: no leg edema Neuro: no unilateral weakness, numbness, or tingling, no vision change or hearing loss Skin: no rash, no skin tear. MSK: No muscle spasm, no deformity, no limitation of range of movement in spin Heme: No easy bruising.  Travel history: No recent long distant travel.  Allergy:  Allergies  Allergen Reactions  . Cephalosporins   . Clarithromycin   . Lunesta  [Eszopiclone]     Heart racing  . Penicillin V     Has patient had a PCN reaction causing immediate rash, facial/tongue/throat swelling, SOB or lightheadedness with hypotension: Yes Has patient had a PCN reaction causing severe rash involving mucus membranes or skin necrosis: No Has patient had a PCN reaction that required hospitalization No Has patient had a PCN reaction occurring within the last 10 years: No If all of the above answers are "NO", then may proceed with Cephalosporin use.  . Sulfa Antibiotics     Past Medical History:  Diagnosis Date  . Anemia    a. 08/2016 Guaiac + stool. EGD w/ gastritis.  . Anxiety   . CKD (chronic kidney disease), stage III   . Colon polyp    a. 08/2016 Colonoscopy.  . Gastritis    a. 08/2016 EGD: Gastritis-->PPI.  [name] GERD (gastroesophageal reflux disease)   . History of chicken pox   . History of measles as a child   . Hypertension   . Hyponatremia    a. 08/2016 in setting of HCTZ Rx.  . Multiple  thyroid nodules     Past Surgical History:  Procedure Laterality Date  . ABDOMINAL HYSTERECTOMY  2003   due to heavy periods and clotting during menses  . BREAST LUMPECTOMY Left 1990  . CHOLECYSTECTOMY  2010  . COLONOSCOPY WITH PROPOFOL N/A 09/07/2016   Procedure: COLONOSCOPY WITH PROPOFOL;  Surgeon: Lucilla Lame, MD;  Location: [facility] ENDOSCOPY;  Service: Endoscopy;  Laterality: N/A;  . DIALYSIS/PERMA CATHETER INSERTION N/A 11/16/2018   Procedure: DIALYSIS/PERMA CATHETER INSERTION;  Surgeon: Algernon Huxley, MD;  Location: [facility] LAB;   Service: Cardiovascular;  Laterality: N/A;  . DIALYSIS/PERMA CATHETER REMOVAL N/A 08/16/2019   Procedure: DIALYSIS/PERMA CATHETER REMOVAL;  Surgeon: Algernon Huxley, MD;  Location: [facility];  Service: Cardiovascular;  Laterality: N/A;  . DILATION AND CURETTAGE OF UTERUS  1985  . ESOPHAGOGASTRODUODENOSCOPY (EGD) WITH PROPOFOL N/A 09/07/2016   Procedure: ESOPHAGOGASTRODUODENOSCOPY (EGD) WITH PROPOFOL;  Surgeon: Lucilla Lame, MD;  Location: [facility] ENDOSCOPY;  Service: Endoscopy;  Laterality: N/A;  . GALLBLADDER SURGERY  2012  . KNEE SURGERY     Dr. Tamala Julian  . MRI, Lumbar spine  07/23/2009   Multilevel annular bulge and disc protrusion at L2-L3, L3-L4, L4-L5, and L5-S1  . TONSILLECTOMY    . TONSILLECTOMY AND ADENOIDECTOMY  1970  . UPPER GI ENDOSCOPY  03/02/2006   H. Pylori negative; Dr. Allen Norris    Social History:  reports that she has never smoked. She has never used smokeless tobacco. She reports current alcohol use. She reports that she does not use drugs.  Family History:  Family History  Problem Relation Age of Onset  . Pancreatic cancer Father   . Congestive Heart Failure Maternal Grandmother   . Cancer Paternal Grandfather   . Heart disease Maternal Uncle      Prior to Admission medications   Medication Sig Start Date End Date Taking? Authorizing Provider  amLODipine (NORVASC) 10 MG tablet Take 10 mg by mouth daily. 02/27/19  Yes [provider]  b complex-vitamin c-folic acid (NEPHRO-VITE) 0.8 MG TABS tablet Take 1 tablet by mouth daily.   Yes [provider]  buPROPion (WELLBUTRIN) 75 MG tablet TAKE 1 TABLET(75 MG) BY MOUTH TWICE DAILY Patient taking differently: Take 75 mg by mouth 2 (two) times daily.  11/25/18  Yes [name], MD  butalbital-acetaminophen-caffeine (FIORICET) [id number] MG tablet TAKE 1 TO 2 TABLETS BY MOUTH EVERY 4 HOURS AS NEEDED FOR HEADACHE 12/12/19  Yes [name], MD  carvedilol (COREG) 25 MG tablet Take 25 mg by mouth 2 (two)  times daily. 12/06/19  Yes [provider]  cloNIDine (CATAPRES) 0.2 MG tablet Take 0.2 mg by mouth 3 (three) times daily. 11/24/19  Yes [provider]  esomeprazole (NEXIUM) 20 MG capsule Take 20 mg by mouth daily. 02/20/19  Yes [provider]  hydrALAZINE (APRESOLINE) 100 MG tablet TAKE 1 TABLET(100 MG) BY MOUTH EVERY 8 HOURS AS NEEDED Patient taking differently: Take 100 mg by mouth 3 (three) times daily.  10/26/19  Yes [name], MD  LORazepam (ATIVAN) 1 MG tablet Take 1 tablet (1 mg total) by mouth 2 (two) times daily as needed. 08/21/19  Yes [name], MD  losartan (COZAAR) 100 MG tablet Take 100 mg by mouth at bedtime. 02/20/19  Yes [provider]  metoprolol tartrate 75 MG TABS Take 75 mg by mouth 2 (two) times daily. 04/06/19  Yes Thornell Mule, MD  valACYclovir (VALTREX) 1000 MG tablet Take 1,000 mg by mouth 2 (  two) times daily as needed. 09/11/19  Yes [provider]  zolpidem (AMBIEN) 10 MG tablet TAKE 1/2 TO 1 TABLET(5 TO 10 MG) BY MOUTH AT BEDTIME AS NEEDED 11/15/19  Yes [name], MD    Physical Exam: Vitals:   12/16/19 1114 12/16/19 1115 12/16/19 1230 12/16/19 1317  BP: (!) 102/59  (!) 110/58 (!) 142/72  Pulse: 65  64 65  Resp: 14  16 17   Temp: 98 F (36.7 C)     TempSrc: Oral     SpO2: 99%  100% 100%  Weight:  55 kg    Height:  5\' 4"  (1.626 m)     General: Not in acute distress.  Dry mucous membrane HEENT:       Eyes: PERRL, EOMI, no scleral icterus.       ENT: No discharge from the ears and nose, no pharynx injection, no tonsillar enlargement.        Neck: No JVD, no bruit, no mass felt. Heme: No neck lymph node enlargement. Cardiac: S1/S2, RRR, No murmurs, No gallops or rubs. Respiratory: No rales, wheezing, rhonchi or rubs. GI: Soft, nondistended, nontender, no rebound pain, no organomegaly, BS present. GU: No hematuria Ext: No pitting leg edema bilaterally. 1+DP/PT pulse  bilaterally. Musculoskeletal: No joint deformities, No joint redness or warmth, no limitation of ROM in spin. Skin: No rashes.  Neuro: Alert, oriented X3, cranial nerves II-XII grossly intact, moves all extremities normally.  Psych: Patient is not psychotic, no suicidal or hemocidal ideation.  Labs on Admission: I have personally reviewed following labs and imaging studies  CBC: Recent Labs  Lab 12/16/19 1137  WBC 7.0  NEUTROABS 4.2  HGB 9.0*  HCT 26.4*  MCV 98.1  PLT 976   Basic Metabolic Panel: Recent Labs  Lab 12/16/19 1137  NA 131*  K 2.3*  CL 95*  CO2 21*  GLUCOSE 105*  BUN 43*  CREATININE 6.31*  CALCIUM 7.1*  MG 1.6*   GFR: Estimated Creatinine Clearance: 7.1 mL/min (A) (by C-G formula based on SCr of 6.31 mg/dL (H)). Liver Function Tests: Recent Labs  Lab 12/16/19 1137  AST 15  ALT 12  ALKPHOS 75  BILITOT 0.6  PROT 4.5*  ALBUMIN 2.6*   No results for input(s): LIPASE, AMYLASE in the last 168 hours. No results for input(s): AMMONIA in the last 168 hours. Coagulation Profile: No results for input(s): INR, PROTIME in the last 168 hours. Cardiac Enzymes: No results for input(s): CKTOTAL, CKMB, CKMBINDEX, TROPONINI in the last 168 hours. BNP (last 3 results) No results for input(s): PROBNP in the last 8760 hours. HbA1C: No results for input(s): HGBA1C in the last 72 hours. CBG: No results for input(s): GLUCAP in the last 168 hours. Lipid Profile: No results for input(s): CHOL, HDL, LDLCALC, TRIG, CHOLHDL, LDLDIRECT in the last 72 hours. Thyroid Function Tests: No results for input(s): TSH, T4TOTAL, FREET4, T3FREE, THYROIDAB in the last 72 hours. Anemia Panel: No results for input(s): VITAMINB12, FOLATE, FERRITIN, TIBC, IRON, RETICCTPCT in the last 72 hours. Urine analysis:    Component Value Date/Time   COLORURINE YELLOW (A) 04/03/2019 1356   APPEARANCEUR HAZY (A) 04/03/2019 1356   LABSPEC 1.017 04/03/2019 1356   PHURINE 5.0 04/03/2019 1356    GLUCOSEU NEGATIVE 04/03/2019 1356   HGBUR NEGATIVE 04/03/2019 1356   BILIRUBINUR NEGATIVE 04/03/2019 1356   BILIRUBINUR negative 08/31/2018 [place] 04/03/2019 1356   PROTEINUR 100 (A) 04/03/2019 1356   UROBILINOGEN 0.2 08/31/2018 1132  NITRITE NEGATIVE 04/03/2019 [place] 04/03/2019 1356   Sepsis Labs: @LABRCNTIP (procalcitonin:4,lacticidven:4) ) Recent Results (from the past 240 hour(s))  SARS Coronavirus 2 by RT PCR (hospital order, performed in [facility] hospital lab) Nasopharyngeal Nasopharyngeal Swab     Status: None   Collection Time: 12/16/19 12:05 PM   Specimen: Nasopharyngeal Swab  Result Value Ref Range Status   SARS Coronavirus 2 NEGATIVE NEGATIVE Final    Comment: (NOTE) SARS-CoV-2 target nucleic acids are NOT DETECTED.  The SARS-CoV-2 RNA is generally detectable in upper and lower respiratory specimens during the acute phase of infection. The lowest concentration of SARS-CoV-2 viral copies this assay can detect is 250 copies / mL. A negative result does not preclude SARS-CoV-2 infection and should not be used as the sole basis for treatment or other patient management decisions.  A negative result may occur with improper specimen collection / handling, submission of specimen other than nasopharyngeal swab, presence of viral mutation(s) within the areas targeted by this assay, and inadequate number of viral copies (<250 copies / mL). A negative result must be combined with clinical observations, patient history, and epidemiological information.  Fact Sheet for Patients:   StrictlyIdeas.no  Fact Sheet for Healthcare Providers: BankingDealers.co.za  This test is not yet approved or  cleared by the [place] FDA and has been authorized for detection and/or diagnosis of SARS-CoV-2 by FDA under an Emergency Use Authorization (EUA).  This EUA will remain in effect (meaning this  test can be used) for the duration of the COVID-19 declaration under Section 564(b)(1) of the Act, 21 U.S.C. section 360bbb-3(b)(1), unless the authorization is terminated or revoked sooner.  Performed at [facility], [address], [place], [place] [address]      Radiological Exams on Admission: DG Chest 2 View  Result Date: 12/16/2019 CLINICAL DATA:  Patient with syncope. EXAM: CHEST - 2 VIEW COMPARISON:  Chest radiograph earlier same day. FINDINGS: Stable cardiac and mediastinal contours. No consolidative pulmonary opacities. No pleural effusion or pneumothorax. Thoracic spine degenerative changes. IMPRESSION: No active cardiopulmonary disease. Electronically Signed   By: Lovey Newcomer M.D.   On: 12/16/2019 13:28   DG Chest Portable 1 View  Result Date: 12/16/2019 CLINICAL DATA:  Syncope. EXAM: PORTABLE CHEST 1 VIEW COMPARISON:  04/03/2019 FINDINGS: Dialysis catheter has been removed. Numerous leads and wires project over the chest. Midline trachea. Normal heart size. No pleural effusion or pneumothorax. Biapical pleural thickening. Mild hyperinflation. Nonspecific interstitial thickening is mild, similar. No lobar consolidation. IMPRESSION: No acute cardiopulmonary disease. Electronically Signed   By: Abigail Miyamoto M.D.   On: 12/16/2019 11:58     EKG: Independently reviewed.  Sinus rhythm, QTC 493, low voltage, nonspecific T wave change  Assessment/Plan Principal Problem:   Syncope Active Problems:   Hypokalemia   End stage renal disease (HCC)   Benign essential HTN   Hypotension   Anemia in ESRD (end-stage renal disease) (HCC)   Atrial fibrillation, chronic (HCC)   Depression with anxiety   Hypomagnesemia   Syncope and hypotension: Patient had hypotension which is likely the etiology.  Other differential diagnosis is broad, including vasovagal syncope, TIA, arrhythmia, drug abuse, orthostatic status.  Etiology for hypotension is not clear, may be due to dehydration.  Patient does not have fever or leukocytosis.  Lactic acid is normal.  Clinically does not seem to have sepsis.  Blood pressure responded to IV fluid quickly.  Improved from 64/25 -->98/40 after giving 500 cc normal saline, and  further improved to 110/58 -->153/70  - Place on tele bed for obs - Orthostatic vital signs  - Neuro checks  - Hold Bp meds - f/u CT-head  Hypokalemia and Hypomagnesemia: K=2.3, Mg 1.6 -repleted both  End stage renal disease-on peritoneal dialysis -Dr. Theador Hawthorne of renal is consulted  Benign essential HTN: Currently blood pressure 153/70. -Hold amlodipine, metoprolol, hydralazine, Cozaar -Continue Coreg -Continue clonidine, decrease dose from 0.2 mg to 0.1 mg 3 times daily -IV hydralazine as needed  Anemia in ESRD (end-stage renal disease) ([place]): hgb stable. 8.6 on 04/06/19 -->9.0 -f/u by CBC  Atrial fibrillation, chronic [facility]): Patient is not taking anticoagulants.  Heart rate 64. -continue Coreg   Depression with anxiety: no SI or HI -continue home meds      DVT ppx: SQ Heparin  Code Status: Full code Family Communication:   Yes, patient's  husband  at bed side Disposition Plan:  Anticipate discharge back to previous environment Consults called:  Dr. Theador Hawthorne of renal Admission status: Med-surg bed for obs   Status is: Observation  The patient remains OBS appropriate and will d/c before 2 midnights.  Dispo: The patient is from: Home              Anticipated d/c is to: Home              Anticipated d/c date is: 1 day              Patient currently is not medically stable to d/c.          Date of Service 12/16/2019    [facility]   If 7PM-7AM, please contact night-coverage [email or web address] 12/16/2019, 2:10 PM

## 2019-12-16 NOTE — Consult Note (Addendum)
DILPREET FAIRES MRN: [id number] DOB/AGE: [birth year] 71 y.o. Primary Care Physician:Fisher, Kirstie Peri, MD Admit date: 12/16/2019 Chief Complaint:  Chief Complaint  Patient presents with  . Loss of Consciousness   HPI: Patient is a 69 year old Caucasian female with a past medical history of end-stage renal disease, patient is on peritoneal dialysis, hypertension, hyperlipidemia, atrial fibrillation, depression, anemia who came to the ER with chief complaint of loss of consciousness.  History of present illness dates back to this morning when patient had loss of consciousness which was witnessed by her spouse.Patient was brought to the ER.  In the ER patient was found to be hypotensive with blood pressure below 70 mmHg.  Patient did receive IV bolus and that improved her blood pressure to around 100 mmHg.  Patient gave a history that during her PD cath yesterday patient had 5 L of negative fluid balance  Upon evaluation in the ER patient was found to be hypokalemic, hypotensive she was admitted for further care  Patient gave a history that she has been using 2.5% glucose from past few weeks and she had noticed that her swelling had disappeared.  Patient then also gave a history that she has also noticed that her blood pressure was much better from past 2 weeks. Patient then decided Friday night to use 1.5% glucose instead of 2.5% glucose concentration in her PD prescription. Patient does not remember the exact number but she does say that she was still having some fluid removal.-I encouraged patient husband to please bring the chart from home.  Patient then also gave a history that her blood pressure systolic was 867 mmHg this morning but she took her clonidine and later had this episode of loss of consciousness aroun 1 hour after taking clonidine.  Patient now feels much better.  Patient husband was present during the H&P he also gave the similar history in the impression that patient is now much  better than what she came in with No complaint of fever cough or chills No complaint of recent Covid contacts No complaint of nausea vomiting diarrhea no complaint of abdominal pain No complaint of change in speech or change in vision   Past Medical History:  Diagnosis Date  . Anemia    a. 08/2016 Guaiac + stool. EGD w/ gastritis.  . Anxiety   . CKD (chronic kidney disease), stage III   . Colon polyp    a. 08/2016 Colonoscopy.  . Gastritis    a. 08/2016 EGD: Gastritis-->PPI.  [name] GERD (gastroesophageal reflux disease)   . History of chicken pox   . History of measles as a child   . Hypertension   . Hyponatremia    a. 08/2016 in setting of HCTZ Rx.  . Multiple thyroid nodules         Family History  Problem Relation Age of Onset  . Pancreatic cancer Father   . Congestive Heart Failure Maternal Grandmother   . Cancer Paternal Grandfather   . Heart disease Maternal Uncle     Social History:  reports that she has never smoked. She has never used smokeless tobacco. She reports current alcohol use. She reports that she does not use drugs.   Allergies:  Allergies  Allergen Reactions  . Cephalosporins   . Clarithromycin   . Lunesta  [Eszopiclone]     Heart racing  . Penicillin V     Has patient had a PCN reaction causing immediate rash, facial/tongue/throat swelling, SOB or lightheadedness with hypotension: Yes Has patient  had a PCN reaction causing severe rash involving mucus membranes or skin necrosis: No Has patient had a PCN reaction that required hospitalization No Has patient had a PCN reaction occurring within the last 10 years: No If all of the above answers are "NO", then may proceed with Cephalosporin use.  . Sulfa Antibiotics     (Not in a hospital admission)      [id number] from the symptoms mentioned above,there are no other symptoms referable to all systems reviewed.   Physical Exam: Vital signs in last 24 hours: Temp:  [98 F (36.7 C)] 98 F (36.7  C) (08/15 1114) Pulse Rate:  [64-65] 65 (08/15 1523) Resp:  [14-17] 17 (08/15 1523) BP: (102-153)/(58-72) 153/70 (08/15 1523) SpO2:  [99 %-100 %] 100 % (08/15 1523) Weight:  [55 kg] 55 kg (08/15 1115) Weight change:     Intake/Output from previous day: No intake/output data recorded. Total I/O In: 150 [IV Piggyback:150] Out: -    Physical Exam: General- pt is awake,alert, oriented to time place and person Resp- No acute REsp distress, CTA B/L NO Rhonchi CVS- S1S2 regular in rate and rhythm GIT- BS+, soft, NT, ND EXT- NO LE Edema, Cyanosis CNS- CN 2-12 grossly intact. Moving all 4 extremities Psych- normal mod and affect Access-PD catheter in situ   Lab Results: CBC Recent Labs    12/16/19 1137  WBC 7.0  HGB 9.0*  HCT 26.4*  PLT 222    BMET Recent Labs    12/16/19 1137  NA 131*  K 2.3*  CL 95*  CO2 21*  GLUCOSE 105*  BUN 43*  CREATININE 6.31*  CALCIUM 7.1*    MICRO Recent Results (from the past 240 hour(s))  SARS Coronavirus 2 by RT PCR (hospital order, performed in [facility] hospital lab) Nasopharyngeal Nasopharyngeal Swab     Status: None   Collection Time: 12/16/19 12:05 PM   Specimen: Nasopharyngeal Swab  Result Value Ref Range Status   SARS Coronavirus 2 NEGATIVE NEGATIVE Final    Comment: (NOTE) SARS-CoV-2 target nucleic acids are NOT DETECTED.  The SARS-CoV-2 RNA is generally detectable in upper and lower respiratory specimens during the acute phase of infection. The lowest concentration of SARS-CoV-2 viral copies this assay can detect is 250 copies / mL. A negative result does not preclude SARS-CoV-2 infection and should not be used as the sole basis for treatment or other patient management decisions.  A negative result may occur with improper specimen collection / handling, submission of specimen other than nasopharyngeal swab, presence of viral mutation(s) within the areas targeted by this assay, and inadequate number of viral  copies (<250 copies / mL). A negative result must be combined with clinical observations, patient history, and epidemiological information.  Fact Sheet for Patients:   StrictlyIdeas.no  Fact Sheet for Healthcare Providers: BankingDealers.co.za  This test is not yet approved or  cleared by the [place] FDA and has been authorized for detection and/or diagnosis of SARS-CoV-2 by FDA under an Emergency Use Authorization (EUA).  This EUA will remain in effect (meaning this test can be used) for the duration of the COVID-19 declaration under Section 564(b)(1) of the Act, 21 U.S.C. section 360bbb-3(b)(1), unless the authorization is terminated or revoked sooner.  Performed at [facility], [address], [place], [place] [address]       Lab Results  Component Value Date   CALCIUM 7.1 (L) 12/16/2019   PHOS 6.1 (H) 11/08/2018      Impression: 1)Renal end-stage  renal disease.  Patient is on peritoneal dialysis. Patient PD prescription is Patient has 4 cycles of 2 L Patient last fill is-0 Patient is on [name] Patient target dry weight is 51 kg.. Patient total treatment time is 9 hours Patient uses 1.5% solution versus 2.5% solution depending upon her clinical state at home-dry weight/edema/blood pressure  We will hold off on PD tonight  2) hypotension Patient is now clinically better Patient is not requiring on any pressors   3)Anemia of chronic disease HGb at goal (9--11)   4) secondary hyperparathyroidism CKD Mineral-Bone Disorder Secondary hyperparathyroidism-present Patient phosphorus is not at goal Encourage patient to please eat low phosphorus diet and take her binders  5) hyponatremia Patient is hyponatremic secondary to ESRD Inability to get rid of free water   6) hypokalemic Patient potassium is being repleted  7)Acid base Co2 at goal     Plan:   Will hold off on PD tonight     -no  hyperkalemia/no acidosis/no fluid overload.     -Patient is rather hypotensive and hypokalemic. We will keep patient on her Epogen protocol  Agree with the potassium replacement.  We will follow patient's sodium.  Educated patient to please cut down on her phosphorus intake.  We will address patient antihypertensive regimen before discharge    I did discuss patient's kidney related issues at length with patient and her spouse after taking patient permission.  Yesha Muchow s Theador Hawthorne 12/16/2019, 4:52 PM

## 2019-12-16 NOTE — ED Notes (Signed)
Pt assisted to the bathroom without difficulty, no distress noted at this time

## 2019-12-16 NOTE — ED Provider Notes (Addendum)
[facility] Emergency Department Provider Note   ____________________________________________   First MD Initiated Contact with Patient 12/16/19 1121     (approximate)  I have reviewed the triage vital signs and the nursing notes.   HISTORY  Chief Complaint Loss of Consciousness    HPI Amy [name] is a 69 y.o. female patient reports she was sitting at the kitchen table and woke up on the floor.  Blood pressure was low when EMS got there 6425.  Went up with fluids.  Patient does peritoneal dialysis in the evenings.  She is not had any problems.  Right now she feels kind of weak but is otherwise okay.  No headache no nausea vomiting or belly pain.         Past Medical History:  Diagnosis Date  . Anemia    a. 08/2016 Guaiac + stool. EGD w/ gastritis.  . Anxiety   . CKD (chronic kidney disease), stage III   . Colon polyp    a. 08/2016 Colonoscopy.  . Gastritis    a. 08/2016 EGD: Gastritis-->PPI.  [name] GERD (gastroesophageal reflux disease)   . History of chicken pox   . History of measles as a child   . Hypertension   . Hyponatremia    a. 08/2016 in setting of HCTZ Rx.  . Multiple thyroid nodules     Patient Active Problem List   Diagnosis Date Noted  . Atrial flutter ([place])   . Fever 04/03/2019  . Atrial fibrillation with RVR ([place]) 04/03/2019  . Benign essential HTN 04/03/2019  . HLD (hyperlipidemia) 04/03/2019  . End stage renal disease ([place]) 12/13/2018  . Closed fracture of shaft of left ulna 11/27/2018  . Itching 11/08/2018  . ARF (acute renal failure) ([place]) 06/19/2018  . Strain of knee 11/24/2017  . Hyperparathyroidism, secondary renal ([place]) 09/13/2017  . Ground glass opacity present on imaging of lung 04/08/2017  . Mesenteric lymphadenopathy 04/08/2017  . Osteoarthritis of knee 12/29/2016  . Normocytic anemia 10/14/2016  . Anemia in chronic renal disease 10/10/2016  . Leg swelling   . Intractable episodic headache   . Gastritis     . History of adenomatous polyp of colon 09/07/2016  . Benign neoplasm of cecum   . Rectal polyp   . Abdominal pain 09/05/2016  . Iron deficiency anemia 09/05/2016  . Shoulder pain 05/13/2016  . CKD (chronic kidney disease) stage 5, GFR less than 15 ml/min (HCC) 02/05/2016  . Chronic headache 12/11/2015  . Multiple thyroid nodules 07/04/2015  . Right thyroid nodule 06/27/2015  . Allergic rhinitis, seasonal 12/03/2014  . Fever blister 12/03/2014  . Herniated lumbar intervertebral disc 12/03/2014  . Recurrent sinus infections 12/03/2014  . Insomnia 11/05/2014  . Acquired scoliosis 06/26/2014  . Intervertebral disc stenosis of neural canal of lumbar region 06/20/2014  . Lumbar facet arthropathy 05/21/2014  . Degenerative spondylolisthesis 03/12/2014  . OP (osteoporosis) 08/10/2013  . Idiopathic peripheral neuropathy 03/24/2009  . Edema 12/27/2007  . Headache, tension-type 10/25/2006  . Hypertension 10/24/2006  . Anxiety 05/03/1998  . Esophageal reflux 05/03/1998    Past Surgical History:  Procedure Laterality Date  . ABDOMINAL HYSTERECTOMY  2003   due to heavy periods and clotting during menses  . BREAST LUMPECTOMY Left 1990  . CHOLECYSTECTOMY  2010  . COLONOSCOPY WITH PROPOFOL N/A 09/07/2016   Procedure: COLONOSCOPY WITH PROPOFOL;  Surgeon: Lucilla Lame, MD;  [facility] ENDOSCOPY;  Service: Endoscopy;  Laterality: N/A;  . DIALYSIS/PERMA CATHETER INSERTION N/A 11/16/2018  Procedure: DIALYSIS/PERMA CATHETER INSERTION;  Surgeon: Algernon Huxley, MD;  Location: [place] CV LAB;  Service: Cardiovascular;  Laterality: N/A;  . DIALYSIS/PERMA CATHETER REMOVAL N/A 08/16/2019   Procedure: DIALYSIS/PERMA CATHETER REMOVAL;  Surgeon: Algernon Huxley, MD;  Location: [place] [facility];  Service: Cardiovascular;  Laterality: N/A;  . DILATION AND CURETTAGE OF UTERUS  1985  . ESOPHAGOGASTRODUODENOSCOPY (EGD) WITH PROPOFOL N/A 09/07/2016   Procedure: ESOPHAGOGASTRODUODENOSCOPY (EGD) WITH  PROPOFOL;  Surgeon: Lucilla Lame, MD;  Location: [facility] ENDOSCOPY;  Service: Endoscopy;  Laterality: N/A;  . GALLBLADDER SURGERY  2012  . KNEE SURGERY     Dr. Tamala Julian  . MRI, Lumbar spine  07/23/2009   Multilevel annular bulge and disc protrusion at L2-L3, L3-L4, L4-L5, and L5-S1  . TONSILLECTOMY    . TONSILLECTOMY AND ADENOIDECTOMY  1970  . UPPER GI ENDOSCOPY  03/02/2006   H. Pylori negative; Dr. Allen Norris    Prior to Admission medications   Medication Sig Start Date End Date Taking? Authorizing Provider  amLODipine (NORVASC) 10 MG tablet Take 10 mg by mouth daily. 02/27/19   [provider]  apixaban (ELIQUIS) 2.5 MG TABS tablet Take 1 tablet (2.5 mg total) by mouth 2 (two) times daily. Patient not taking: Reported on 08/16/2019 05/07/19   [name], MD  buPROPion (WELLBUTRIN) 75 MG tablet TAKE 1 TABLET(75 MG) BY MOUTH TWICE DAILY Patient taking differently: Take 75 mg by mouth 2 (two) times daily.  11/25/18   [name], MD  butalbital-acetaminophen-caffeine (FIORICET) [phone] MG tablet TAKE 1 TO 2 TABLETS BY MOUTH EVERY 4 HOURS AS NEEDED FOR HEADACHE 12/12/19   [name], MD  calcium acetate (PHOSLO) 667 MG capsule Take 667 mg by mouth 3 (three) times daily with meals. 01/17/19   [provider]  cloNIDine (CATAPRES) 0.3 MG tablet TAKE 1 TABLET(0.3 MG) BY MOUTH EVERY 8 HOURS 07/05/19   [name], MD  esomeprazole (NEXIUM) 20 MG capsule Take 20 mg by mouth daily. 02/20/19   [provider]  hydrALAZINE (APRESOLINE) 100 MG tablet TAKE 1 TABLET(100 MG) BY MOUTH EVERY 8 HOURS AS NEEDED 10/26/19   [name], MD  hydrALAZINE (APRESOLINE) 50 MG tablet Take 1 tablet (50 mg total) by mouth 3 (three) times daily. Please make appt for further refills. Patient not taking: Reported on 05/07/2019 04/20/19   Minna Merritts, MD  LORazepam (ATIVAN) 1 MG tablet Take 1 tablet (1 mg total) by mouth 2 (two) times daily as needed. 08/21/19   [name], MD    losartan (COZAAR) 100 MG tablet Take 100 mg by mouth at bedtime. 02/20/19   [provider]  meloxicam (MOBIC) 15 MG tablet Take 15 mg by mouth daily as needed for pain. 02/09/19   [provider]  metoprolol tartrate 75 MG TABS Take 75 mg by mouth 2 (two) times daily. 04/06/19   Thornell Mule, MD  multivitamin (RENA-VIT) TABS tablet Take 1 tablet by mouth daily. 03/05/19   [provider]  torsemide (DEMADEX) 20 MG tablet Take 20 mg by mouth daily.  04/09/19   [provider]  zolpidem (AMBIEN) 10 MG tablet TAKE 1/2 TO 1 TABLET(5 TO 10 MG) BY MOUTH AT BEDTIME AS NEEDED 11/15/19   [name], MD    Allergies Cephalosporins, Clarithromycin, Lunesta  [eszopiclone], Penicillin v, and Sulfa antibiotics  Family History  Problem Relation Age of Onset  . Pancreatic cancer Father   . Congestive Heart Failure Maternal Grandmother   .  Cancer Paternal Grandfather   . Heart disease Maternal Uncle     Social History Social History   Tobacco Use  . Smoking status: Never Smoker  . Smokeless tobacco: Never Used  Vaping Use  . Vaping Use: Never used  Substance Use Topics  . Alcohol use: Yes    Alcohol/week: 0.0 - 2.0 standard drinks  . Drug use: No    Review of Systems  Constitutional: No fever/chills Eyes: No visual changes. ENT: No sore throat. Cardiovascular: Denies chest pain. Respiratory: Denies shortness of breath. Gastrointestinal: No abdominal pain.  No nausea, no vomiting.  No diarrhea.  No constipation. Genitourinary: Negative for dysuria. Musculoskeletal: Negative for back pain. Skin: Negative for rash. Neurological: Negative for headaches, focal weakness   ____________________________________________   PHYSICAL EXAM:  VITAL SIGNS: ED Triage Vitals  Enc Vitals Group     BP 12/16/19 1114 (!) 102/59     Pulse Rate 12/16/19 1114 65     Resp 12/16/19 1114 14     Temp 12/16/19 1114 98 F (36.7 C)     Temp Source 12/16/19 1114  Oral     SpO2 12/16/19 1114 99 %     Weight 12/16/19 1115 121 lb 4.1 oz (55 kg)     Height 12/16/19 1115 5\' 4"  (1.626 m)     Head Circumference --      Peak Flow --      Pain Score 12/16/19 1116 0     Pain Loc --      Pain Edu? --      Excl. in [place]? --     Constitutional: Alert and oriented. Well appearing and in no acute distress. Eyes: Conjunctivae are normal. PER. EOMI. Head: Atraumatic. Nose: No congestion/rhinnorhea. Mouth/Throat: Mucous membranes are moist.  Oropharynx non-erythematous. Neck: No stridor.  No cervical spine tenderness to palpation Cardiovascular: Normal rate, regular rhythm. Grossly normal heart sounds.  Good peripheral circulation. Respiratory: Normal respiratory effort.  No retractions. Lungs CTAB. Gastrointestinal: Soft and nontender. No distention. No abdominal bruits.  Musculoskeletal: No lower extremity tenderness nor edema.  Patient does report tingling going down the left leg.  She has had this before.  She started back pain and got some shots for it and got better. Neurologic:  Normal speech and language. No gross focal neurologic deficits are appreciated.  Skin:  Skin is warm, dry and intact. No rash noted. Psychiatric: Mood and affect are normal. Speech and behavior are normal.  ____________________________________________   LABS (all labs ordered are listed, but only abnormal results are displayed)  Labs Reviewed  CBC WITH DIFFERENTIAL/PLATELET - Abnormal; Notable for the following components:      Result Value   RBC 2.69 (*)    Hemoglobin 9.0 (*)    HCT 26.4 (*)    Eosinophils Absolute 1.0 (*)    All other components within normal limits  COMPREHENSIVE METABOLIC PANEL - Abnormal; Notable for the following components:   Sodium 131 (*)    Potassium 2.3 (*)    Chloride 95 (*)    CO2 21 (*)    Glucose, Bld 105 (*)    BUN 43 (*)    Creatinine, Ser 6.31 (*)    Calcium 7.1 (*)    Total Protein 4.5 (*)    Albumin 2.6 (*)    GFR calc non Af  Amer 6 (*)    GFR calc Af Amer 7 (*)    All other components within normal limits  SARS CORONAVIRUS 2 BY RT  PCR (HOSPITAL ORDER, [place] [facility])  LACTIC ACID, PLASMA  URINALYSIS, COMPLETE (UACMP) WITH MICROSCOPIC  TROPONIN I (HIGH SENSITIVITY)   ____________________________________________  EKG  _EKG read interpreted by me shows normal sinus rhythm rate of 64 normal axis no acute ST-T wave changes ___________________________________________  RADIOLOGY  ED MD interpretation: Chest x-ray read by radiology reviewed by me does not show any acute disease.  I am paging the radiologist to ask him about a spot in the right upper lobe.  Not sure if it is the end of the first rib or nodule.  Official radiology report(s): DG Chest Portable 1 View  Result Date: 12/16/2019 CLINICAL DATA:  Syncope. EXAM: PORTABLE CHEST 1 VIEW COMPARISON:  04/03/2019 FINDINGS: Dialysis catheter has been removed. Numerous leads and wires project over the chest. Midline trachea. Normal heart size. No pleural effusion or pneumothorax. Biapical pleural thickening. Mild hyperinflation. Nonspecific interstitial thickening is mild, similar. No lobar consolidation. IMPRESSION: No acute cardiopulmonary disease. Electronically Signed   By: Abigail Miyamoto M.D.   On: 12/16/2019 11:58    ____________________________________________   PROCEDURES  Procedure(s) performed (including Critical Care):  Procedures   ____________________________________________   INITIAL IMPRESSION / ASSESSMENT AND PLAN / ED COURSE  Patient's potassium is low.  We will give her some potassium.  Will have to be a little bit careful because she does do dialysis. We will have to evaluate this patient for possible arrhythmia causing her to pass out suddenly while she was sitting down.  Troponin was ordered quite some time ago but still has not been done.  Coronavirus test is pending.  EKG does not show any signs of pathology.   Repeat chest x-ray was done to evaluate for the possible nodule looks the same so this is likely the end of the first rib.        Clinical Course as of Dec 16 1311  Sun Dec 16, 2019  1255 [facility]: 33.5 [PM]    Clinical Course User Index [PM] Nena Polio, MD     ____________________________________________   FINAL CLINICAL IMPRESSION(S) / ED DIAGNOSES  Final diagnoses:  Syncope and collapse     ED Discharge Orders    None       Note:  This document was prepared using Dragon voice recognition software and may include unintentional dictation errors.    Nena Polio, MD 12/16/19 1251    Nena Polio, MD 12/16/19 1316

## 2019-12-16 NOTE — ED Notes (Signed)
Phlebotomist at patient bedside at this time.

## 2019-12-17 DIAGNOSIS — N186 End stage renal disease: Secondary | ICD-10-CM

## 2019-12-17 DIAGNOSIS — R55 Syncope and collapse: Secondary | ICD-10-CM | POA: Diagnosis not present

## 2019-12-17 DIAGNOSIS — D631 Anemia in chronic kidney disease: Secondary | ICD-10-CM

## 2019-12-17 DIAGNOSIS — I482 Chronic atrial fibrillation, unspecified: Secondary | ICD-10-CM

## 2019-12-17 DIAGNOSIS — I10 Essential (primary) hypertension: Secondary | ICD-10-CM

## 2019-12-17 LAB — GLUCOSE, CAPILLARY: Glucose-Capillary: 92 mg/dL (ref 70–99)

## 2019-12-17 LAB — CBC
HCT: 28.7 % — ABNORMAL LOW (ref 36.0–46.0)
Hemoglobin: 10.3 g/dL — ABNORMAL LOW (ref 12.0–15.0)
MCH: 33.6 pg (ref 26.0–34.0)
MCHC: 35.9 g/dL (ref 30.0–36.0)
MCV: 93.5 fL (ref 80.0–100.0)
Platelets: 261 10*3/uL (ref 150–400)
RBC: 3.07 MIL/uL — ABNORMAL LOW (ref 3.87–5.11)
RDW: 15 % (ref 11.5–15.5)
WBC: 9.6 10*3/uL (ref 4.0–10.5)
nRBC: 0 % (ref 0.0–0.2)

## 2019-12-17 LAB — BASIC METABOLIC PANEL
Anion gap: 15 (ref 5–15)
BUN: 50 mg/dL — ABNORMAL HIGH (ref 8–23)
CO2: 18 mmol/L — ABNORMAL LOW (ref 22–32)
Calcium: 7.8 mg/dL — ABNORMAL LOW (ref 8.9–10.3)
Chloride: 96 mmol/L — ABNORMAL LOW (ref 98–111)
Creatinine, Ser: 6.71 mg/dL — ABNORMAL HIGH (ref 0.44–1.00)
GFR calc Af Amer: 7 mL/min — ABNORMAL LOW (ref >60)
GFR calc non Af Amer: 6 mL/min — ABNORMAL LOW (ref >60)
Glucose, Bld: 93 mg/dL (ref 70–99)
Potassium: 3.6 mmol/L (ref 3.5–5.1)
Sodium: 129 mmol/L — ABNORMAL LOW (ref 135–145)

## 2019-12-17 LAB — MAGNESIUM: Magnesium: 2.3 mg/dL (ref 1.7–2.4)

## 2019-12-17 MED ORDER — CLONIDINE HCL 0.2 MG PO TABS: 0.2000 mg | ORAL_TABLET | Freq: Two times a day (BID) | ORAL | 0 refills | Status: DC

## 2019-12-17 MED ORDER — OXYCODONE-ACETAMINOPHEN 5-325 MG PO TABS
1.0000 | ORAL_TABLET | Freq: Once | ORAL | Status: AC
  Administered 2019-12-17: 1 via ORAL
  Filled 2019-12-17: qty 1

## 2019-12-17 MED ORDER — HYDRALAZINE HCL 50 MG PO TABS
50.0000 mg | ORAL_TABLET | Freq: Three times a day (TID) | ORAL | 0 refills | Status: DC | PRN
Start: 2019-12-17 — End: 2020-07-04

## 2019-12-17 NOTE — ED Notes (Signed)
PT reporting a headache that feels similar to migraines at home. Pt requesting Fioricet. Pharmacy has updated t that Fiercest is unavailable at this time. Pt updated and MD asked about alternative pain management.

## 2019-12-17 NOTE — Progress Notes (Signed)
[facility], [place] 12/17/19  Subjective:   Hospital day # 0    Patient is feeling well today No N/V No pain reported No LE edema or SOB BP better controlled  Objective:  Vital signs in last 24 hours:  Temp:  [98 F (36.7 C)-98.4 F (36.9 C)] 98.4 F (36.9 C) (08/16 0916) Pulse Rate:  [64-76] 69 (08/16 0916) Resp:  [12-21] 18 (08/16 0916) BP: (102-158)/(52-95) 133/68 (08/16 0916) SpO2:  [92 %-100 %] 97 % (08/16 0916) Weight:  [55 kg] 55 kg (08/15 1115)  Weight change:  Filed Weights   12/16/19 1115  Weight: 55 kg    Intake/Output:    Intake/Output Summary (Last 24 hours) at 12/17/2019 1110 Last data filed at 12/17/2019 1044 Gross per 24 hour  Intake 150 ml  Output 200 ml  Net -50 ml    Physical Exam: General:  No acute distress, laying in the bed  HEENT  anicteric, moist oral mucous membrane  Pulm/lungs  normal breathing effort, lungs are clear to auscultation  CVS/Heart  regular rhythm, no rub or gallop  Abdomen:   Soft, nontender  Extremities:  No peripheral edema  Neurologic:  Alert, oriented, able to follow commands  Skin:  No acute rashes  PD catheter     Basic Metabolic Panel:  Recent Labs  Lab 12/16/19 1137 12/17/19 0444  NA 131* 129*  K 2.3* 3.6  CL 95* 96*  CO2 21* 18*  GLUCOSE 105* 93  BUN 43* 50*  CREATININE 6.31* 6.71*  CALCIUM 7.1* 7.8*  MG 1.6* 2.3     CBC: Recent Labs  Lab 12/16/19 1137 12/17/19 0444  WBC 7.0 9.6  NEUTROABS 4.2  --   HGB 9.0* 10.3*  HCT 26.4* 28.7*  MCV 98.1 93.5  PLT 222 261     No results found for: HEPBSAG, HEPBSAB, HEPBIGM    Microbiology:  Recent Results (from the past 240 hour(s))  SARS Coronavirus 2 by RT PCR (hospital order, performed in [place] hospital lab) Nasopharyngeal Nasopharyngeal Swab     Status: None   Collection Time: 12/16/19 12:05 PM   Specimen: Nasopharyngeal Swab  Result Value Ref Range Status   SARS Coronavirus 2 NEGATIVE NEGATIVE Final     Comment: (NOTE) SARS-CoV-2 target nucleic acids are NOT DETECTED.  The SARS-CoV-2 RNA is generally detectable in upper and lower respiratory specimens during the acute phase of infection. The lowest concentration of SARS-CoV-2 viral copies this assay can detect is 250 copies / mL. A negative result does not preclude SARS-CoV-2 infection and should not be used as the sole basis for treatment or other patient management decisions.  A negative result may occur with improper specimen collection / handling, submission of specimen other than nasopharyngeal swab, presence of viral mutation(s) within the areas targeted by this assay, and inadequate number of viral copies (<250 copies / mL). A negative result must be combined with clinical observations, patient history, and epidemiological information.  Fact Sheet for Patients:   StrictlyIdeas.no  Fact Sheet for Healthcare Providers: BankingDealers.co.za  This test is not yet approved or  cleared by the [place] FDA and has been authorized for detection and/or diagnosis of SARS-CoV-2 by FDA under an Emergency Use Authorization (EUA).  This EUA will remain in effect (meaning this test can be used) for the duration of the COVID-19 declaration under Section 564(b)(1) of the Act, 21 U.S.C. section 360bbb-3(b)(1), unless the authorization is terminated or revoked sooner.  Performed at [facility]  Lab, [place], [place] [address]     Coagulation Studies: No results for input(s): LABPROT, INR in the last 72 hours.  Urinalysis: Recent Labs    12/16/19 1742  COLORURINE STRAW*  LABSPEC 1.010  PHURINE 5.0  GLUCOSEU NEGATIVE  HGBUR NEGATIVE  BILIRUBINUR NEGATIVE  KETONESUR NEGATIVE  PROTEINUR NEGATIVE  NITRITE NEGATIVE  LEUKOCYTESUR TRACE*      Imaging: DG Chest 2 View  Result Date: 12/16/2019 CLINICAL DATA:  Patient with syncope. EXAM: CHEST - 2 VIEW  COMPARISON:  Chest radiograph earlier same day. FINDINGS: Stable cardiac and mediastinal contours. No consolidative pulmonary opacities. No pleural effusion or pneumothorax. Thoracic spine degenerative changes. IMPRESSION: No active cardiopulmonary disease. Electronically Signed   By: Lovey Newcomer M.D.   On: 12/16/2019 13:28   CT HEAD WO CONTRAST  Result Date: 12/16/2019 CLINICAL DATA:  Syncope. EXAM: CT HEAD WITHOUT CONTRAST TECHNIQUE: Contiguous axial images were obtained from the base of the skull through the vertex without intravenous contrast. COMPARISON:  12/28/2016 FINDINGS: Brain: Ventricles, cisterns and other CSF spaces are normal. Suggestion of subtle chronic ischemic microvascular disease. Small subcentimeter old lacunar infarct inferior aspect of left lentiform nucleus. No mass, mass effect, shift of midline structures or acute hemorrhage. No evidence of acute infarction. Vascular: No hyperdense vessel or unexpected calcification. Skull: Normal. Negative for fracture or focal lesion. Sinuses/Orbits: No acute finding.  Hypoplastic frontal sinuses. Other: None. IMPRESSION: 1. No acute findings. 2. Subtle chronic ischemic microvascular disease. Small old left basal ganglia lacunar infarct. Electronically Signed   By: Marin Olp M.D.   On: 12/16/2019 14:55   DG Chest Portable 1 View  Result Date: 12/16/2019 CLINICAL DATA:  Syncope. EXAM: PORTABLE CHEST 1 VIEW COMPARISON:  04/03/2019 FINDINGS: Dialysis catheter has been removed. Numerous leads and wires project over the chest. Midline trachea. Normal heart size. No pleural effusion or pneumothorax. Biapical pleural thickening. Mild hyperinflation. Nonspecific interstitial thickening is mild, similar. No lobar consolidation. IMPRESSION: No acute cardiopulmonary disease. Electronically Signed   By: Abigail Miyamoto M.D.   On: 12/16/2019 11:58     Medications:    . buPROPion  75 mg Oral BID  . carvedilol  25 mg Oral BID  . cloNIDine  0.1 mg Oral  TID  . heparin  5,000 Units Subcutaneous Q8H  . multivitamin  1 tablet Oral QHS  . pantoprazole  40 mg Oral Daily  . sodium chloride flush  3 mL Intravenous Q12H   acetaminophen, butalbital-acetaminophen-caffeine, hydrALAZINE, LORazepam, ondansetron (ZOFRAN) IV, zolpidem  Assessment/ Plan:  69 y.o. female with ESRD, HTN, A Fib, Anemia, h/o depression   admitted on 12/16/2019 for Syncope [R55]  # Syncope - due to Hypovolemic state and hypotension -  BP meds adjusted  # HTN - patient is taking both carvedilol and metoprolol Discontinue metoprolol.  Continue carvedilol at current dose -Reduce clonidine to 0.2 twice a day Continue Cozaar and hydralazine (PRN)  #End-stage renal disease -Resume peritoneal dialysis at home. - f/u in PD clinic      LOS: 0 Amrom Ore 8/16/202111:10 AM  [place], [place]  Note: This note was prepared with Dragon dictation. Any transcription errors are unintentional

## 2019-12-17 NOTE — ED Notes (Signed)
Pt resting at this time. NAD.

## 2019-12-18 ENCOUNTER — Encounter: Payer: Medicare Other | Admitting: Family Medicine

## 2019-12-18 DIAGNOSIS — Z992 Dependence on renal dialysis: Secondary | ICD-10-CM | POA: Diagnosis not present

## 2019-12-18 DIAGNOSIS — D631 Anemia in chronic kidney disease: Secondary | ICD-10-CM | POA: Diagnosis not present

## 2019-12-18 DIAGNOSIS — D509 Iron deficiency anemia, unspecified: Secondary | ICD-10-CM | POA: Diagnosis not present

## 2019-12-18 DIAGNOSIS — N186 End stage renal disease: Secondary | ICD-10-CM | POA: Diagnosis not present

## 2019-12-19 DIAGNOSIS — Z992 Dependence on renal dialysis: Secondary | ICD-10-CM | POA: Diagnosis not present

## 2019-12-19 DIAGNOSIS — N186 End stage renal disease: Secondary | ICD-10-CM | POA: Diagnosis not present

## 2019-12-19 DIAGNOSIS — D509 Iron deficiency anemia, unspecified: Secondary | ICD-10-CM | POA: Diagnosis not present

## 2019-12-19 DIAGNOSIS — D631 Anemia in chronic kidney disease: Secondary | ICD-10-CM | POA: Diagnosis not present

## 2019-12-19 NOTE — Discharge Summary (Signed)
[facility] NAME: Amy [name]    MR#:  [id number]  DATE OF BIRTH:  [birth year]  DATE OF ADMISSION:  12/16/2019   ADMITTING PHYSICIAN: Ivor Costa, MD  DATE OF DISCHARGE: 12/17/2019  2:00 PM  PRIMARY CARE PHYSICIAN: [name], MD   ADMISSION DIAGNOSIS:  Syncope [R55] DISCHARGE DIAGNOSIS:  Principal Problem:   Syncope Active Problems:   Hypokalemia   End stage renal disease (HCC)   Benign essential HTN   Hypotension   Anemia in ESRD (end-stage renal disease) (HCC)   Atrial fibrillation, chronic ([place])   Depression with anxiety   Hypomagnesemia  SECONDARY DIAGNOSIS:   Past Medical History:  Diagnosis Date  . Anemia    a. 08/2016 Guaiac + stool. EGD w/ gastritis.  . Anxiety   . CKD (chronic kidney disease), stage III   . Colon polyp    a. 08/2016 Colonoscopy.  . Gastritis    a. 08/2016 EGD: Gastritis-->PPI.  [name] GERD (gastroesophageal reflux disease)   . History of chicken pox   . History of measles as a child   . Hypertension   . Hyponatremia    a. 08/2016 in setting of HCTZ Rx.  . Multiple thyroid nodules    HOSPITAL COURSE:  69 year old female with a known history of ESRD on peritoneal dialysis at home, hypertension, A. fib, anemia of chronic kidney disease, depression admitted for syncope/presyncope.  Syncope/presyncope Due to hypovolemic state and hypotension thought to be iatrogenic in nature. We have cut back on her blood pressure medication.  After discussion with nephrology we are stopping metoprolol and reducing the dose of clonidine to 0.2 mg twice a day. She should continue carvedilol at current dose along with Cozaar.  We are also cutting back on the dose of hydralazine as needed.  Hypertension Blood pressure running low at times requiring adjustment in her home blood pressure regimen as above.  Patient, her daughter and nephrology are in agreement.  ESRD Resume peritoneal dialysis at home per nephrology     DISCHARGE CONDITIONS:  Stable CONSULTS OBTAINED:   DRUG ALLERGIES:   Allergies  Allergen Reactions  . Cephalosporins   . Clarithromycin   . Lunesta  [Eszopiclone]     Heart racing  . Penicillin V     Has patient had a PCN reaction causing immediate rash, facial/tongue/throat swelling, SOB or lightheadedness with hypotension: Yes Has patient had a PCN reaction causing severe rash involving mucus membranes or skin necrosis: No Has patient had a PCN reaction that required hospitalization No Has patient had a PCN reaction occurring within the last 10 years: No If all of the above answers are "NO", then may proceed with Cephalosporin use.  . Sulfa Antibiotics    DISCHARGE MEDICATIONS:   Allergies as of 12/17/2019      Reactions   Cephalosporins    Clarithromycin    Lunesta  [eszopiclone]    Heart racing   Penicillin V    Has patient had a PCN reaction causing immediate rash, facial/tongue/throat swelling, SOB or lightheadedness with hypotension: Yes Has patient had a PCN reaction causing severe rash involving mucus membranes or skin necrosis: No Has patient had a PCN reaction that required hospitalization No Has patient had a PCN reaction occurring within the last 10 years: No If all of the above answers are "NO", then may proceed with Cephalosporin use.   Sulfa Antibiotics       Medication List  STOP taking these medications   Metoprolol Tartrate 75 MG Tabs     TAKE these medications   amLODipine 10 MG tablet Commonly known as: NORVASC Take 10 mg by mouth daily.   b complex-vitamin c-folic acid 0.8 MG Tabs tablet Take 1 tablet by mouth daily.   buPROPion 75 MG tablet Commonly known as: WELLBUTRIN TAKE 1 TABLET(75 MG) BY MOUTH TWICE DAILY What changed: See the new instructions.   butalbital-acetaminophen-caffeine 50-325-40 MG tablet Commonly known as: FIORICET TAKE 1 TO 2 TABLETS BY MOUTH EVERY 4 HOURS AS NEEDED FOR HEADACHE   carvedilol 25 MG  tablet Commonly known as: COREG Take 25 mg by mouth 2 (two) times daily.   cloNIDine 0.2 MG tablet Commonly known as: CATAPRES Take 1 tablet (0.2 mg total) by mouth 2 (two) times daily. What changed: when to take this   esomeprazole 20 MG capsule Commonly known as: NEXIUM Take 20 mg by mouth daily.   hydrALAZINE 50 MG tablet Commonly known as: APRESOLINE Take 1 tablet (50 mg total) by mouth 3 (three) times daily as needed. For SBP > 160 mm hg What changed:   medication strength  See the new instructions.   LORazepam 1 MG tablet Commonly known as: ATIVAN Take 1 tablet (1 mg total) by mouth 2 (two) times daily as needed.   losartan 100 MG tablet Commonly known as: COZAAR Take 100 mg by mouth at bedtime.   valACYclovir 1000 MG tablet Commonly known as: VALTREX Take 1,000 mg by mouth 2 (two) times daily as needed.   zolpidem 10 MG tablet Commonly known as: AMBIEN TAKE 1/2 TO 1 TABLET(5 TO 10 MG) BY MOUTH AT BEDTIME AS NEEDED      DISCHARGE INSTRUCTIONS:   DIET:  Renal diet DISCHARGE CONDITION:  Stable ACTIVITY:  Activity as tolerated OXYGEN:  Home Oxygen: No.  Oxygen Delivery: room air DISCHARGE LOCATION:  home   If you experience worsening of your admission symptoms, develop shortness of breath, life threatening emergency, suicidal or homicidal thoughts you must seek medical attention immediately by calling 911 or calling your MD immediately  if symptoms less severe.  You Must read complete instructions/literature along with all the possible adverse reactions/side effects for all the Medicines you take and that have been prescribed to you. Take any new Medicines after you have completely understood and accpet all the possible adverse reactions/side effects.   Please note  You were cared for by a hospitalist during your hospital stay. If you have any questions about your discharge medications or the care you received while you were in the hospital after you are  discharged, you can call the unit and asked to speak with the hospitalist on call if the hospitalist that took care of you is not available. Once you are discharged, your primary care physician will handle any further medical issues. Please note that NO REFILLS for any discharge medications will be authorized once you are discharged, as it is imperative that you return to your primary care physician (or establish a relationship with a primary care physician if you do not have one) for your aftercare needs so that they can reassess your need for medications and monitor your lab values.    On the day of Discharge:  VITAL SIGNS:  Blood pressure 133/68, pulse 69, temperature 98.4 F (36.9 C), temperature source Oral, resp. rate 18, height 5\' 4"  (1.626 m), weight 55 kg, SpO2 97 %. PHYSICAL EXAMINATION:  GENERAL:  69 y.o.-year-old patient lying in the  bed with no acute distress.  EYES: Pupils equal, round, reactive to light and accommodation. No scleral icterus. Extraocular muscles intact.  HEENT: Head atraumatic, normocephalic. Oropharynx and nasopharynx clear.  NECK:  Supple, no jugular venous distention. No thyroid enlargement, no tenderness.  LUNGS: Normal breath sounds bilaterally, no wheezing, rales,rhonchi or crepitation. No use of accessory muscles of respiration.  CARDIOVASCULAR: S1, S2 normal. No murmurs, rubs, or gallops.  ABDOMEN: Soft, non-tender, non-distended. Bowel sounds present. No organomegaly or mass.  EXTREMITIES: No pedal edema, cyanosis, or clubbing.  NEUROLOGIC: Cranial nerves II through XII are intact. Muscle strength 5/5 in all extremities. Sensation intact. Gait not checked.  PSYCHIATRIC: The patient is alert and oriented x 3.  SKIN: No obvious rash, lesion, or ulcer.  DATA REVIEW:   CBC Recent Labs  Lab 12/17/19 0444  WBC 9.6  HGB 10.3*  HCT 28.7*  PLT 261    Chemistries  Recent Labs  Lab 12/16/19 1137 12/16/19 1137 12/17/19 0444  NA 131*   < > 129*  K  2.3*   < > 3.6  CL 95*   < > 96*  CO2 21*   < > 18*  GLUCOSE 105*   < > 93  BUN 43*   < > 50*  CREATININE 6.31*   < > 6.71*  CALCIUM 7.1*   < > 7.8*  MG 1.6*   < > 2.3  AST 15  --   --   ALT 12  --   --   ALKPHOS 75  --   --   BILITOT 0.6  --   --    < > = values in this interval not displayed.     Outpatient follow-up  Follow-up Information    [name], MD. Schedule an appointment as soon as possible for a visit in 1 week(s).   Specialty: Family Medicine Contact information: [address] [place] [address] [phone]                Management plans discussed with the patient, family and they are in agreement.  CODE STATUS: Prior   TOTAL TIME TAKING CARE OF THIS PATIENT: 45 minutes.    Max Sane M.D on 12/19/2019 at 11:51 AM  [facility]   CC: Primary care physician; [name], MD   Note: This dictation was prepared with Dragon dictation along with smaller phrase technology. Any transcriptional errors that result from this process are unintentional.

## 2019-12-20 DIAGNOSIS — D631 Anemia in chronic kidney disease: Secondary | ICD-10-CM | POA: Diagnosis not present

## 2019-12-20 DIAGNOSIS — Z992 Dependence on renal dialysis: Secondary | ICD-10-CM | POA: Diagnosis not present

## 2019-12-20 DIAGNOSIS — N186 End stage renal disease: Secondary | ICD-10-CM | POA: Diagnosis not present

## 2019-12-20 DIAGNOSIS — D509 Iron deficiency anemia, unspecified: Secondary | ICD-10-CM | POA: Diagnosis not present

## 2019-12-21 DIAGNOSIS — N186 End stage renal disease: Secondary | ICD-10-CM | POA: Diagnosis not present

## 2019-12-21 DIAGNOSIS — D509 Iron deficiency anemia, unspecified: Secondary | ICD-10-CM | POA: Diagnosis not present

## 2019-12-21 DIAGNOSIS — D631 Anemia in chronic kidney disease: Secondary | ICD-10-CM | POA: Diagnosis not present

## 2019-12-21 DIAGNOSIS — Z992 Dependence on renal dialysis: Secondary | ICD-10-CM | POA: Diagnosis not present

## 2019-12-22 DIAGNOSIS — D509 Iron deficiency anemia, unspecified: Secondary | ICD-10-CM | POA: Diagnosis not present

## 2019-12-22 DIAGNOSIS — N186 End stage renal disease: Secondary | ICD-10-CM | POA: Diagnosis not present

## 2019-12-22 DIAGNOSIS — D631 Anemia in chronic kidney disease: Secondary | ICD-10-CM | POA: Diagnosis not present

## 2019-12-22 DIAGNOSIS — Z992 Dependence on renal dialysis: Secondary | ICD-10-CM | POA: Diagnosis not present

## 2019-12-23 DIAGNOSIS — N186 End stage renal disease: Secondary | ICD-10-CM | POA: Diagnosis not present

## 2019-12-23 DIAGNOSIS — D631 Anemia in chronic kidney disease: Secondary | ICD-10-CM | POA: Diagnosis not present

## 2019-12-23 DIAGNOSIS — D509 Iron deficiency anemia, unspecified: Secondary | ICD-10-CM | POA: Diagnosis not present

## 2019-12-23 DIAGNOSIS — Z992 Dependence on renal dialysis: Secondary | ICD-10-CM | POA: Diagnosis not present

## 2019-12-24 DIAGNOSIS — D631 Anemia in chronic kidney disease: Secondary | ICD-10-CM | POA: Diagnosis not present

## 2019-12-24 DIAGNOSIS — N186 End stage renal disease: Secondary | ICD-10-CM | POA: Diagnosis not present

## 2019-12-24 DIAGNOSIS — Z992 Dependence on renal dialysis: Secondary | ICD-10-CM | POA: Diagnosis not present

## 2019-12-24 DIAGNOSIS — D509 Iron deficiency anemia, unspecified: Secondary | ICD-10-CM | POA: Diagnosis not present

## 2019-12-25 DIAGNOSIS — Z992 Dependence on renal dialysis: Secondary | ICD-10-CM | POA: Diagnosis not present

## 2019-12-25 DIAGNOSIS — N186 End stage renal disease: Secondary | ICD-10-CM | POA: Diagnosis not present

## 2019-12-25 DIAGNOSIS — D631 Anemia in chronic kidney disease: Secondary | ICD-10-CM | POA: Diagnosis not present

## 2019-12-25 DIAGNOSIS — D509 Iron deficiency anemia, unspecified: Secondary | ICD-10-CM | POA: Diagnosis not present

## 2019-12-26 DIAGNOSIS — D631 Anemia in chronic kidney disease: Secondary | ICD-10-CM | POA: Diagnosis not present

## 2019-12-26 DIAGNOSIS — D509 Iron deficiency anemia, unspecified: Secondary | ICD-10-CM | POA: Diagnosis not present

## 2019-12-26 DIAGNOSIS — Z992 Dependence on renal dialysis: Secondary | ICD-10-CM | POA: Diagnosis not present

## 2019-12-26 DIAGNOSIS — N186 End stage renal disease: Secondary | ICD-10-CM | POA: Diagnosis not present

## 2019-12-27 DIAGNOSIS — Z992 Dependence on renal dialysis: Secondary | ICD-10-CM | POA: Diagnosis not present

## 2019-12-27 DIAGNOSIS — D509 Iron deficiency anemia, unspecified: Secondary | ICD-10-CM | POA: Diagnosis not present

## 2019-12-27 DIAGNOSIS — N186 End stage renal disease: Secondary | ICD-10-CM | POA: Diagnosis not present

## 2019-12-27 DIAGNOSIS — D631 Anemia in chronic kidney disease: Secondary | ICD-10-CM | POA: Diagnosis not present

## 2019-12-28 DIAGNOSIS — N186 End stage renal disease: Secondary | ICD-10-CM | POA: Diagnosis not present

## 2019-12-28 DIAGNOSIS — D509 Iron deficiency anemia, unspecified: Secondary | ICD-10-CM | POA: Diagnosis not present

## 2019-12-28 DIAGNOSIS — D631 Anemia in chronic kidney disease: Secondary | ICD-10-CM | POA: Diagnosis not present

## 2019-12-28 DIAGNOSIS — Z992 Dependence on renal dialysis: Secondary | ICD-10-CM | POA: Diagnosis not present

## 2019-12-29 DIAGNOSIS — Z992 Dependence on renal dialysis: Secondary | ICD-10-CM | POA: Diagnosis not present

## 2019-12-29 DIAGNOSIS — D631 Anemia in chronic kidney disease: Secondary | ICD-10-CM | POA: Diagnosis not present

## 2019-12-29 DIAGNOSIS — D509 Iron deficiency anemia, unspecified: Secondary | ICD-10-CM | POA: Diagnosis not present

## 2019-12-29 DIAGNOSIS — N186 End stage renal disease: Secondary | ICD-10-CM | POA: Diagnosis not present

## 2019-12-30 DIAGNOSIS — N186 End stage renal disease: Secondary | ICD-10-CM | POA: Diagnosis not present

## 2019-12-30 DIAGNOSIS — Z992 Dependence on renal dialysis: Secondary | ICD-10-CM | POA: Diagnosis not present

## 2019-12-30 DIAGNOSIS — D509 Iron deficiency anemia, unspecified: Secondary | ICD-10-CM | POA: Diagnosis not present

## 2019-12-30 DIAGNOSIS — D631 Anemia in chronic kidney disease: Secondary | ICD-10-CM | POA: Diagnosis not present

## 2019-12-31 DIAGNOSIS — D509 Iron deficiency anemia, unspecified: Secondary | ICD-10-CM | POA: Diagnosis not present

## 2019-12-31 DIAGNOSIS — Z992 Dependence on renal dialysis: Secondary | ICD-10-CM | POA: Diagnosis not present

## 2019-12-31 DIAGNOSIS — N186 End stage renal disease: Secondary | ICD-10-CM | POA: Diagnosis not present

## 2019-12-31 DIAGNOSIS — D631 Anemia in chronic kidney disease: Secondary | ICD-10-CM | POA: Diagnosis not present

## 2020-01-01 DIAGNOSIS — D631 Anemia in chronic kidney disease: Secondary | ICD-10-CM | POA: Diagnosis not present

## 2020-01-01 DIAGNOSIS — D509 Iron deficiency anemia, unspecified: Secondary | ICD-10-CM | POA: Diagnosis not present

## 2020-01-01 DIAGNOSIS — Z992 Dependence on renal dialysis: Secondary | ICD-10-CM | POA: Diagnosis not present

## 2020-01-01 DIAGNOSIS — N186 End stage renal disease: Secondary | ICD-10-CM | POA: Diagnosis not present

## 2020-01-02 DIAGNOSIS — D509 Iron deficiency anemia, unspecified: Secondary | ICD-10-CM | POA: Diagnosis not present

## 2020-01-02 DIAGNOSIS — N186 End stage renal disease: Secondary | ICD-10-CM | POA: Diagnosis not present

## 2020-01-02 DIAGNOSIS — D631 Anemia in chronic kidney disease: Secondary | ICD-10-CM | POA: Diagnosis not present

## 2020-01-02 DIAGNOSIS — Z992 Dependence on renal dialysis: Secondary | ICD-10-CM | POA: Diagnosis not present

## 2020-01-02 DIAGNOSIS — N2581 Secondary hyperparathyroidism of renal origin: Secondary | ICD-10-CM | POA: Diagnosis not present

## 2020-01-03 DIAGNOSIS — N2581 Secondary hyperparathyroidism of renal origin: Secondary | ICD-10-CM | POA: Diagnosis not present

## 2020-01-03 DIAGNOSIS — D509 Iron deficiency anemia, unspecified: Secondary | ICD-10-CM | POA: Diagnosis not present

## 2020-01-03 DIAGNOSIS — D631 Anemia in chronic kidney disease: Secondary | ICD-10-CM | POA: Diagnosis not present

## 2020-01-03 DIAGNOSIS — N186 End stage renal disease: Secondary | ICD-10-CM | POA: Diagnosis not present

## 2020-01-03 DIAGNOSIS — Z992 Dependence on renal dialysis: Secondary | ICD-10-CM | POA: Diagnosis not present

## 2020-01-04 DIAGNOSIS — D509 Iron deficiency anemia, unspecified: Secondary | ICD-10-CM | POA: Diagnosis not present

## 2020-01-04 DIAGNOSIS — N2581 Secondary hyperparathyroidism of renal origin: Secondary | ICD-10-CM | POA: Diagnosis not present

## 2020-01-04 DIAGNOSIS — Z992 Dependence on renal dialysis: Secondary | ICD-10-CM | POA: Diagnosis not present

## 2020-01-04 DIAGNOSIS — D631 Anemia in chronic kidney disease: Secondary | ICD-10-CM | POA: Diagnosis not present

## 2020-01-04 DIAGNOSIS — N186 End stage renal disease: Secondary | ICD-10-CM | POA: Diagnosis not present

## 2020-01-05 DIAGNOSIS — D509 Iron deficiency anemia, unspecified: Secondary | ICD-10-CM | POA: Diagnosis not present

## 2020-01-05 DIAGNOSIS — N186 End stage renal disease: Secondary | ICD-10-CM | POA: Diagnosis not present

## 2020-01-05 DIAGNOSIS — Z992 Dependence on renal dialysis: Secondary | ICD-10-CM | POA: Diagnosis not present

## 2020-01-05 DIAGNOSIS — N2581 Secondary hyperparathyroidism of renal origin: Secondary | ICD-10-CM | POA: Diagnosis not present

## 2020-01-05 DIAGNOSIS — D631 Anemia in chronic kidney disease: Secondary | ICD-10-CM | POA: Diagnosis not present

## 2020-01-06 DIAGNOSIS — D631 Anemia in chronic kidney disease: Secondary | ICD-10-CM | POA: Diagnosis not present

## 2020-01-06 DIAGNOSIS — Z992 Dependence on renal dialysis: Secondary | ICD-10-CM | POA: Diagnosis not present

## 2020-01-06 DIAGNOSIS — N2581 Secondary hyperparathyroidism of renal origin: Secondary | ICD-10-CM | POA: Diagnosis not present

## 2020-01-06 DIAGNOSIS — D509 Iron deficiency anemia, unspecified: Secondary | ICD-10-CM | POA: Diagnosis not present

## 2020-01-06 DIAGNOSIS — N186 End stage renal disease: Secondary | ICD-10-CM | POA: Diagnosis not present

## 2020-01-07 DIAGNOSIS — D631 Anemia in chronic kidney disease: Secondary | ICD-10-CM | POA: Diagnosis not present

## 2020-01-07 DIAGNOSIS — N186 End stage renal disease: Secondary | ICD-10-CM | POA: Diagnosis not present

## 2020-01-07 DIAGNOSIS — Z992 Dependence on renal dialysis: Secondary | ICD-10-CM | POA: Diagnosis not present

## 2020-01-07 DIAGNOSIS — D509 Iron deficiency anemia, unspecified: Secondary | ICD-10-CM | POA: Diagnosis not present

## 2020-01-07 DIAGNOSIS — N2581 Secondary hyperparathyroidism of renal origin: Secondary | ICD-10-CM | POA: Diagnosis not present

## 2020-01-08 DIAGNOSIS — Z992 Dependence on renal dialysis: Secondary | ICD-10-CM | POA: Diagnosis not present

## 2020-01-08 DIAGNOSIS — N2581 Secondary hyperparathyroidism of renal origin: Secondary | ICD-10-CM | POA: Diagnosis not present

## 2020-01-08 DIAGNOSIS — D631 Anemia in chronic kidney disease: Secondary | ICD-10-CM | POA: Diagnosis not present

## 2020-01-08 DIAGNOSIS — N186 End stage renal disease: Secondary | ICD-10-CM | POA: Diagnosis not present

## 2020-01-08 DIAGNOSIS — D509 Iron deficiency anemia, unspecified: Secondary | ICD-10-CM | POA: Diagnosis not present

## 2020-01-09 DIAGNOSIS — D509 Iron deficiency anemia, unspecified: Secondary | ICD-10-CM | POA: Diagnosis not present

## 2020-01-09 DIAGNOSIS — N186 End stage renal disease: Secondary | ICD-10-CM | POA: Diagnosis not present

## 2020-01-09 DIAGNOSIS — Z992 Dependence on renal dialysis: Secondary | ICD-10-CM | POA: Diagnosis not present

## 2020-01-09 DIAGNOSIS — D631 Anemia in chronic kidney disease: Secondary | ICD-10-CM | POA: Diagnosis not present

## 2020-01-09 DIAGNOSIS — N2581 Secondary hyperparathyroidism of renal origin: Secondary | ICD-10-CM | POA: Diagnosis not present

## 2020-01-10 DIAGNOSIS — D631 Anemia in chronic kidney disease: Secondary | ICD-10-CM | POA: Diagnosis not present

## 2020-01-10 DIAGNOSIS — N186 End stage renal disease: Secondary | ICD-10-CM | POA: Diagnosis not present

## 2020-01-10 DIAGNOSIS — Z992 Dependence on renal dialysis: Secondary | ICD-10-CM | POA: Diagnosis not present

## 2020-01-10 DIAGNOSIS — D509 Iron deficiency anemia, unspecified: Secondary | ICD-10-CM | POA: Diagnosis not present

## 2020-01-10 DIAGNOSIS — N2581 Secondary hyperparathyroidism of renal origin: Secondary | ICD-10-CM | POA: Diagnosis not present

## 2020-01-11 DIAGNOSIS — N186 End stage renal disease: Secondary | ICD-10-CM | POA: Diagnosis not present

## 2020-01-11 DIAGNOSIS — Z992 Dependence on renal dialysis: Secondary | ICD-10-CM | POA: Diagnosis not present

## 2020-01-11 DIAGNOSIS — D509 Iron deficiency anemia, unspecified: Secondary | ICD-10-CM | POA: Diagnosis not present

## 2020-01-11 DIAGNOSIS — N2581 Secondary hyperparathyroidism of renal origin: Secondary | ICD-10-CM | POA: Diagnosis not present

## 2020-01-11 DIAGNOSIS — D631 Anemia in chronic kidney disease: Secondary | ICD-10-CM | POA: Diagnosis not present

## 2020-01-12 DIAGNOSIS — N186 End stage renal disease: Secondary | ICD-10-CM | POA: Diagnosis not present

## 2020-01-12 DIAGNOSIS — Z992 Dependence on renal dialysis: Secondary | ICD-10-CM | POA: Diagnosis not present

## 2020-01-12 DIAGNOSIS — N2581 Secondary hyperparathyroidism of renal origin: Secondary | ICD-10-CM | POA: Diagnosis not present

## 2020-01-12 DIAGNOSIS — D631 Anemia in chronic kidney disease: Secondary | ICD-10-CM | POA: Diagnosis not present

## 2020-01-12 DIAGNOSIS — D509 Iron deficiency anemia, unspecified: Secondary | ICD-10-CM | POA: Diagnosis not present

## 2020-01-13 DIAGNOSIS — D631 Anemia in chronic kidney disease: Secondary | ICD-10-CM | POA: Diagnosis not present

## 2020-01-13 DIAGNOSIS — N186 End stage renal disease: Secondary | ICD-10-CM | POA: Diagnosis not present

## 2020-01-13 DIAGNOSIS — D509 Iron deficiency anemia, unspecified: Secondary | ICD-10-CM | POA: Diagnosis not present

## 2020-01-13 DIAGNOSIS — Z992 Dependence on renal dialysis: Secondary | ICD-10-CM | POA: Diagnosis not present

## 2020-01-13 DIAGNOSIS — N2581 Secondary hyperparathyroidism of renal origin: Secondary | ICD-10-CM | POA: Diagnosis not present

## 2020-01-14 ENCOUNTER — Other Ambulatory Visit: Payer: Self-pay | Admitting: Family Medicine

## 2020-01-14 DIAGNOSIS — D631 Anemia in chronic kidney disease: Secondary | ICD-10-CM | POA: Diagnosis not present

## 2020-01-14 DIAGNOSIS — G4700 Insomnia, unspecified: Secondary | ICD-10-CM

## 2020-01-14 DIAGNOSIS — N2581 Secondary hyperparathyroidism of renal origin: Secondary | ICD-10-CM | POA: Diagnosis not present

## 2020-01-14 DIAGNOSIS — Z992 Dependence on renal dialysis: Secondary | ICD-10-CM | POA: Diagnosis not present

## 2020-01-14 DIAGNOSIS — D509 Iron deficiency anemia, unspecified: Secondary | ICD-10-CM | POA: Diagnosis not present

## 2020-01-14 DIAGNOSIS — N186 End stage renal disease: Secondary | ICD-10-CM | POA: Diagnosis not present

## 2020-01-14 NOTE — Telephone Encounter (Signed)
Requested medication (s) are due for refill today: Yes  Requested medication (s) are on the active medication list: Yes  Last refill:  11/14/19  Future visit scheduled: Yes  Notes to clinic:  Unable to refill, cannot delegate     Requested Prescriptions  Pending Prescriptions Disp Refills   zolpidem (AMBIEN) 10 MG tablet [Pharmacy Med Name: ZOLPIDEM 10MG  TABLETS] 30 tablet     Sig: TAKE 1/2 TO 1 TABLET(5 TO 10 MG) BY MOUTH AT BEDTIME AS NEEDED      Not Delegated - Psychiatry:  Anxiolytics/Hypnotics Failed - 01/14/2020  7:08 PM      Failed - This refill cannot be delegated      Failed - Valid encounter within last 6 months    Recent Outpatient Visits           8 months ago Typical atrial flutter [facility])   [facility] [name], MD   1 year ago CKD (chronic kidney disease) stage 5, GFR less than 15 ml/min [facility])   [facility] [place], Dionne Bucy, MD   1 year ago Cellulitis of lower extremity, unspecified laterality   [facility] [name], MD   1 year ago Cellulitis of lower extremity, unspecified laterality   [place], [place]   1 year ago Leg swelling   [place], [place]              Passed - Urine Drug Screen completed in last 360 days.

## 2020-01-15 DIAGNOSIS — N186 End stage renal disease: Secondary | ICD-10-CM | POA: Diagnosis not present

## 2020-01-15 DIAGNOSIS — Z992 Dependence on renal dialysis: Secondary | ICD-10-CM | POA: Diagnosis not present

## 2020-01-15 DIAGNOSIS — D509 Iron deficiency anemia, unspecified: Secondary | ICD-10-CM | POA: Diagnosis not present

## 2020-01-15 DIAGNOSIS — D631 Anemia in chronic kidney disease: Secondary | ICD-10-CM | POA: Diagnosis not present

## 2020-01-15 DIAGNOSIS — N2581 Secondary hyperparathyroidism of renal origin: Secondary | ICD-10-CM | POA: Diagnosis not present

## 2020-01-16 DIAGNOSIS — N2581 Secondary hyperparathyroidism of renal origin: Secondary | ICD-10-CM | POA: Diagnosis not present

## 2020-01-16 DIAGNOSIS — Z992 Dependence on renal dialysis: Secondary | ICD-10-CM | POA: Diagnosis not present

## 2020-01-16 DIAGNOSIS — D509 Iron deficiency anemia, unspecified: Secondary | ICD-10-CM | POA: Diagnosis not present

## 2020-01-16 DIAGNOSIS — N186 End stage renal disease: Secondary | ICD-10-CM | POA: Diagnosis not present

## 2020-01-16 DIAGNOSIS — D631 Anemia in chronic kidney disease: Secondary | ICD-10-CM | POA: Diagnosis not present

## 2020-01-17 DIAGNOSIS — D509 Iron deficiency anemia, unspecified: Secondary | ICD-10-CM | POA: Diagnosis not present

## 2020-01-17 DIAGNOSIS — M48062 Spinal stenosis, lumbar region with neurogenic claudication: Secondary | ICD-10-CM | POA: Diagnosis not present

## 2020-01-17 DIAGNOSIS — N2581 Secondary hyperparathyroidism of renal origin: Secondary | ICD-10-CM | POA: Diagnosis not present

## 2020-01-17 DIAGNOSIS — N186 End stage renal disease: Secondary | ICD-10-CM | POA: Diagnosis not present

## 2020-01-17 DIAGNOSIS — D631 Anemia in chronic kidney disease: Secondary | ICD-10-CM | POA: Diagnosis not present

## 2020-01-17 DIAGNOSIS — Z992 Dependence on renal dialysis: Secondary | ICD-10-CM | POA: Diagnosis not present

## 2020-01-18 DIAGNOSIS — N186 End stage renal disease: Secondary | ICD-10-CM | POA: Diagnosis not present

## 2020-01-18 DIAGNOSIS — D631 Anemia in chronic kidney disease: Secondary | ICD-10-CM | POA: Diagnosis not present

## 2020-01-18 DIAGNOSIS — Z992 Dependence on renal dialysis: Secondary | ICD-10-CM | POA: Diagnosis not present

## 2020-01-18 DIAGNOSIS — D509 Iron deficiency anemia, unspecified: Secondary | ICD-10-CM | POA: Diagnosis not present

## 2020-01-18 DIAGNOSIS — N2581 Secondary hyperparathyroidism of renal origin: Secondary | ICD-10-CM | POA: Diagnosis not present

## 2020-01-19 DIAGNOSIS — N2581 Secondary hyperparathyroidism of renal origin: Secondary | ICD-10-CM | POA: Diagnosis not present

## 2020-01-19 DIAGNOSIS — Z992 Dependence on renal dialysis: Secondary | ICD-10-CM | POA: Diagnosis not present

## 2020-01-19 DIAGNOSIS — D631 Anemia in chronic kidney disease: Secondary | ICD-10-CM | POA: Diagnosis not present

## 2020-01-19 DIAGNOSIS — N186 End stage renal disease: Secondary | ICD-10-CM | POA: Diagnosis not present

## 2020-01-19 DIAGNOSIS — D509 Iron deficiency anemia, unspecified: Secondary | ICD-10-CM | POA: Diagnosis not present

## 2020-01-20 DIAGNOSIS — D631 Anemia in chronic kidney disease: Secondary | ICD-10-CM | POA: Diagnosis not present

## 2020-01-20 DIAGNOSIS — N2581 Secondary hyperparathyroidism of renal origin: Secondary | ICD-10-CM | POA: Diagnosis not present

## 2020-01-20 DIAGNOSIS — N186 End stage renal disease: Secondary | ICD-10-CM | POA: Diagnosis not present

## 2020-01-20 DIAGNOSIS — D509 Iron deficiency anemia, unspecified: Secondary | ICD-10-CM | POA: Diagnosis not present

## 2020-01-20 DIAGNOSIS — Z992 Dependence on renal dialysis: Secondary | ICD-10-CM | POA: Diagnosis not present

## 2020-01-21 DIAGNOSIS — Z992 Dependence on renal dialysis: Secondary | ICD-10-CM | POA: Diagnosis not present

## 2020-01-21 DIAGNOSIS — N186 End stage renal disease: Secondary | ICD-10-CM | POA: Diagnosis not present

## 2020-01-21 DIAGNOSIS — N2581 Secondary hyperparathyroidism of renal origin: Secondary | ICD-10-CM | POA: Diagnosis not present

## 2020-01-21 DIAGNOSIS — D631 Anemia in chronic kidney disease: Secondary | ICD-10-CM | POA: Diagnosis not present

## 2020-01-21 DIAGNOSIS — D509 Iron deficiency anemia, unspecified: Secondary | ICD-10-CM | POA: Diagnosis not present

## 2020-01-22 DIAGNOSIS — D631 Anemia in chronic kidney disease: Secondary | ICD-10-CM | POA: Diagnosis not present

## 2020-01-22 DIAGNOSIS — Z992 Dependence on renal dialysis: Secondary | ICD-10-CM | POA: Diagnosis not present

## 2020-01-22 DIAGNOSIS — N2581 Secondary hyperparathyroidism of renal origin: Secondary | ICD-10-CM | POA: Diagnosis not present

## 2020-01-22 DIAGNOSIS — N186 End stage renal disease: Secondary | ICD-10-CM | POA: Diagnosis not present

## 2020-01-22 DIAGNOSIS — D509 Iron deficiency anemia, unspecified: Secondary | ICD-10-CM | POA: Diagnosis not present

## 2020-01-23 DIAGNOSIS — D509 Iron deficiency anemia, unspecified: Secondary | ICD-10-CM | POA: Diagnosis not present

## 2020-01-23 DIAGNOSIS — D631 Anemia in chronic kidney disease: Secondary | ICD-10-CM | POA: Diagnosis not present

## 2020-01-23 DIAGNOSIS — Z992 Dependence on renal dialysis: Secondary | ICD-10-CM | POA: Diagnosis not present

## 2020-01-23 DIAGNOSIS — N2581 Secondary hyperparathyroidism of renal origin: Secondary | ICD-10-CM | POA: Diagnosis not present

## 2020-01-23 DIAGNOSIS — N186 End stage renal disease: Secondary | ICD-10-CM | POA: Diagnosis not present

## 2020-01-24 DIAGNOSIS — D631 Anemia in chronic kidney disease: Secondary | ICD-10-CM | POA: Diagnosis not present

## 2020-01-24 DIAGNOSIS — D509 Iron deficiency anemia, unspecified: Secondary | ICD-10-CM | POA: Diagnosis not present

## 2020-01-24 DIAGNOSIS — N186 End stage renal disease: Secondary | ICD-10-CM | POA: Diagnosis not present

## 2020-01-24 DIAGNOSIS — N2581 Secondary hyperparathyroidism of renal origin: Secondary | ICD-10-CM | POA: Diagnosis not present

## 2020-01-24 DIAGNOSIS — Z992 Dependence on renal dialysis: Secondary | ICD-10-CM | POA: Diagnosis not present

## 2020-01-25 DIAGNOSIS — D509 Iron deficiency anemia, unspecified: Secondary | ICD-10-CM | POA: Diagnosis not present

## 2020-01-25 DIAGNOSIS — Z992 Dependence on renal dialysis: Secondary | ICD-10-CM | POA: Diagnosis not present

## 2020-01-25 DIAGNOSIS — N2581 Secondary hyperparathyroidism of renal origin: Secondary | ICD-10-CM | POA: Diagnosis not present

## 2020-01-25 DIAGNOSIS — D631 Anemia in chronic kidney disease: Secondary | ICD-10-CM | POA: Diagnosis not present

## 2020-01-25 DIAGNOSIS — N186 End stage renal disease: Secondary | ICD-10-CM | POA: Diagnosis not present

## 2020-01-26 DIAGNOSIS — D631 Anemia in chronic kidney disease: Secondary | ICD-10-CM | POA: Diagnosis not present

## 2020-01-26 DIAGNOSIS — N2581 Secondary hyperparathyroidism of renal origin: Secondary | ICD-10-CM | POA: Diagnosis not present

## 2020-01-26 DIAGNOSIS — Z992 Dependence on renal dialysis: Secondary | ICD-10-CM | POA: Diagnosis not present

## 2020-01-26 DIAGNOSIS — D509 Iron deficiency anemia, unspecified: Secondary | ICD-10-CM | POA: Diagnosis not present

## 2020-01-26 DIAGNOSIS — N186 End stage renal disease: Secondary | ICD-10-CM | POA: Diagnosis not present

## 2020-01-27 DIAGNOSIS — N186 End stage renal disease: Secondary | ICD-10-CM | POA: Diagnosis not present

## 2020-01-27 DIAGNOSIS — Z992 Dependence on renal dialysis: Secondary | ICD-10-CM | POA: Diagnosis not present

## 2020-01-27 DIAGNOSIS — N2581 Secondary hyperparathyroidism of renal origin: Secondary | ICD-10-CM | POA: Diagnosis not present

## 2020-01-27 DIAGNOSIS — D509 Iron deficiency anemia, unspecified: Secondary | ICD-10-CM | POA: Diagnosis not present

## 2020-01-27 DIAGNOSIS — D631 Anemia in chronic kidney disease: Secondary | ICD-10-CM | POA: Diagnosis not present

## 2020-01-28 DIAGNOSIS — D509 Iron deficiency anemia, unspecified: Secondary | ICD-10-CM | POA: Diagnosis not present

## 2020-01-28 DIAGNOSIS — N2581 Secondary hyperparathyroidism of renal origin: Secondary | ICD-10-CM | POA: Diagnosis not present

## 2020-01-28 DIAGNOSIS — D631 Anemia in chronic kidney disease: Secondary | ICD-10-CM | POA: Diagnosis not present

## 2020-01-28 DIAGNOSIS — N186 End stage renal disease: Secondary | ICD-10-CM | POA: Diagnosis not present

## 2020-01-28 DIAGNOSIS — Z992 Dependence on renal dialysis: Secondary | ICD-10-CM | POA: Diagnosis not present

## 2020-01-29 DIAGNOSIS — M4807 Spinal stenosis, lumbosacral region: Secondary | ICD-10-CM | POA: Diagnosis not present

## 2020-01-29 DIAGNOSIS — D631 Anemia in chronic kidney disease: Secondary | ICD-10-CM | POA: Diagnosis not present

## 2020-01-29 DIAGNOSIS — M4307 Spondylolysis, lumbosacral region: Secondary | ICD-10-CM | POA: Diagnosis not present

## 2020-01-29 DIAGNOSIS — M47816 Spondylosis without myelopathy or radiculopathy, lumbar region: Secondary | ICD-10-CM | POA: Diagnosis not present

## 2020-01-29 DIAGNOSIS — N186 End stage renal disease: Secondary | ICD-10-CM | POA: Diagnosis not present

## 2020-01-29 DIAGNOSIS — M5126 Other intervertebral disc displacement, lumbar region: Secondary | ICD-10-CM | POA: Diagnosis not present

## 2020-01-29 DIAGNOSIS — D509 Iron deficiency anemia, unspecified: Secondary | ICD-10-CM | POA: Diagnosis not present

## 2020-01-29 DIAGNOSIS — M48061 Spinal stenosis, lumbar region without neurogenic claudication: Secondary | ICD-10-CM | POA: Diagnosis not present

## 2020-01-29 DIAGNOSIS — M48062 Spinal stenosis, lumbar region with neurogenic claudication: Secondary | ICD-10-CM | POA: Diagnosis not present

## 2020-01-29 DIAGNOSIS — N2581 Secondary hyperparathyroidism of renal origin: Secondary | ICD-10-CM | POA: Diagnosis not present

## 2020-01-29 DIAGNOSIS — Z992 Dependence on renal dialysis: Secondary | ICD-10-CM | POA: Diagnosis not present

## 2020-01-29 DIAGNOSIS — M5136 Other intervertebral disc degeneration, lumbar region: Secondary | ICD-10-CM | POA: Diagnosis not present

## 2020-01-30 DIAGNOSIS — D631 Anemia in chronic kidney disease: Secondary | ICD-10-CM | POA: Diagnosis not present

## 2020-01-30 DIAGNOSIS — Z992 Dependence on renal dialysis: Secondary | ICD-10-CM | POA: Diagnosis not present

## 2020-01-30 DIAGNOSIS — D509 Iron deficiency anemia, unspecified: Secondary | ICD-10-CM | POA: Diagnosis not present

## 2020-01-30 DIAGNOSIS — N186 End stage renal disease: Secondary | ICD-10-CM | POA: Diagnosis not present

## 2020-01-30 DIAGNOSIS — N2581 Secondary hyperparathyroidism of renal origin: Secondary | ICD-10-CM | POA: Diagnosis not present

## 2020-01-31 DIAGNOSIS — D631 Anemia in chronic kidney disease: Secondary | ICD-10-CM | POA: Diagnosis not present

## 2020-01-31 DIAGNOSIS — Z992 Dependence on renal dialysis: Secondary | ICD-10-CM | POA: Diagnosis not present

## 2020-01-31 DIAGNOSIS — M5416 Radiculopathy, lumbar region: Secondary | ICD-10-CM | POA: Diagnosis not present

## 2020-01-31 DIAGNOSIS — D509 Iron deficiency anemia, unspecified: Secondary | ICD-10-CM | POA: Diagnosis not present

## 2020-01-31 DIAGNOSIS — N186 End stage renal disease: Secondary | ICD-10-CM | POA: Diagnosis not present

## 2020-01-31 DIAGNOSIS — N2581 Secondary hyperparathyroidism of renal origin: Secondary | ICD-10-CM | POA: Diagnosis not present

## 2020-02-01 DIAGNOSIS — N2581 Secondary hyperparathyroidism of renal origin: Secondary | ICD-10-CM | POA: Diagnosis not present

## 2020-02-01 DIAGNOSIS — D631 Anemia in chronic kidney disease: Secondary | ICD-10-CM | POA: Diagnosis not present

## 2020-02-01 DIAGNOSIS — N186 End stage renal disease: Secondary | ICD-10-CM | POA: Diagnosis not present

## 2020-02-01 DIAGNOSIS — Z23 Encounter for immunization: Secondary | ICD-10-CM | POA: Diagnosis not present

## 2020-02-01 DIAGNOSIS — D509 Iron deficiency anemia, unspecified: Secondary | ICD-10-CM | POA: Diagnosis not present

## 2020-02-01 DIAGNOSIS — Z992 Dependence on renal dialysis: Secondary | ICD-10-CM | POA: Diagnosis not present

## 2020-02-02 DIAGNOSIS — D631 Anemia in chronic kidney disease: Secondary | ICD-10-CM | POA: Diagnosis not present

## 2020-02-02 DIAGNOSIS — Z23 Encounter for immunization: Secondary | ICD-10-CM | POA: Diagnosis not present

## 2020-02-02 DIAGNOSIS — N2581 Secondary hyperparathyroidism of renal origin: Secondary | ICD-10-CM | POA: Diagnosis not present

## 2020-02-02 DIAGNOSIS — Z992 Dependence on renal dialysis: Secondary | ICD-10-CM | POA: Diagnosis not present

## 2020-02-02 DIAGNOSIS — N186 End stage renal disease: Secondary | ICD-10-CM | POA: Diagnosis not present

## 2020-02-02 DIAGNOSIS — D509 Iron deficiency anemia, unspecified: Secondary | ICD-10-CM | POA: Diagnosis not present

## 2020-02-03 DIAGNOSIS — Z992 Dependence on renal dialysis: Secondary | ICD-10-CM | POA: Diagnosis not present

## 2020-02-03 DIAGNOSIS — D509 Iron deficiency anemia, unspecified: Secondary | ICD-10-CM | POA: Diagnosis not present

## 2020-02-03 DIAGNOSIS — D631 Anemia in chronic kidney disease: Secondary | ICD-10-CM | POA: Diagnosis not present

## 2020-02-03 DIAGNOSIS — N2581 Secondary hyperparathyroidism of renal origin: Secondary | ICD-10-CM | POA: Diagnosis not present

## 2020-02-03 DIAGNOSIS — N186 End stage renal disease: Secondary | ICD-10-CM | POA: Diagnosis not present

## 2020-02-03 DIAGNOSIS — Z23 Encounter for immunization: Secondary | ICD-10-CM | POA: Diagnosis not present

## 2020-02-04 DIAGNOSIS — Z23 Encounter for immunization: Secondary | ICD-10-CM | POA: Diagnosis not present

## 2020-02-04 DIAGNOSIS — D509 Iron deficiency anemia, unspecified: Secondary | ICD-10-CM | POA: Diagnosis not present

## 2020-02-04 DIAGNOSIS — N186 End stage renal disease: Secondary | ICD-10-CM | POA: Diagnosis not present

## 2020-02-04 DIAGNOSIS — D631 Anemia in chronic kidney disease: Secondary | ICD-10-CM | POA: Diagnosis not present

## 2020-02-04 DIAGNOSIS — N2581 Secondary hyperparathyroidism of renal origin: Secondary | ICD-10-CM | POA: Diagnosis not present

## 2020-02-04 DIAGNOSIS — Z992 Dependence on renal dialysis: Secondary | ICD-10-CM | POA: Diagnosis not present

## 2020-02-04 DIAGNOSIS — M5416 Radiculopathy, lumbar region: Secondary | ICD-10-CM | POA: Diagnosis not present

## 2020-02-05 DIAGNOSIS — N186 End stage renal disease: Secondary | ICD-10-CM | POA: Diagnosis not present

## 2020-02-05 DIAGNOSIS — Z23 Encounter for immunization: Secondary | ICD-10-CM | POA: Diagnosis not present

## 2020-02-05 DIAGNOSIS — N2581 Secondary hyperparathyroidism of renal origin: Secondary | ICD-10-CM | POA: Diagnosis not present

## 2020-02-05 DIAGNOSIS — D509 Iron deficiency anemia, unspecified: Secondary | ICD-10-CM | POA: Diagnosis not present

## 2020-02-05 DIAGNOSIS — Z992 Dependence on renal dialysis: Secondary | ICD-10-CM | POA: Diagnosis not present

## 2020-02-05 DIAGNOSIS — D631 Anemia in chronic kidney disease: Secondary | ICD-10-CM | POA: Diagnosis not present

## 2020-02-06 DIAGNOSIS — D631 Anemia in chronic kidney disease: Secondary | ICD-10-CM | POA: Diagnosis not present

## 2020-02-06 DIAGNOSIS — N186 End stage renal disease: Secondary | ICD-10-CM | POA: Diagnosis not present

## 2020-02-06 DIAGNOSIS — Z992 Dependence on renal dialysis: Secondary | ICD-10-CM | POA: Diagnosis not present

## 2020-02-06 DIAGNOSIS — N2581 Secondary hyperparathyroidism of renal origin: Secondary | ICD-10-CM | POA: Diagnosis not present

## 2020-02-06 DIAGNOSIS — Z23 Encounter for immunization: Secondary | ICD-10-CM | POA: Diagnosis not present

## 2020-02-06 DIAGNOSIS — D509 Iron deficiency anemia, unspecified: Secondary | ICD-10-CM | POA: Diagnosis not present

## 2020-02-07 DIAGNOSIS — Z23 Encounter for immunization: Secondary | ICD-10-CM | POA: Diagnosis not present

## 2020-02-07 DIAGNOSIS — D509 Iron deficiency anemia, unspecified: Secondary | ICD-10-CM | POA: Diagnosis not present

## 2020-02-07 DIAGNOSIS — D631 Anemia in chronic kidney disease: Secondary | ICD-10-CM | POA: Diagnosis not present

## 2020-02-07 DIAGNOSIS — N186 End stage renal disease: Secondary | ICD-10-CM | POA: Diagnosis not present

## 2020-02-07 DIAGNOSIS — N2581 Secondary hyperparathyroidism of renal origin: Secondary | ICD-10-CM | POA: Diagnosis not present

## 2020-02-07 DIAGNOSIS — Z992 Dependence on renal dialysis: Secondary | ICD-10-CM | POA: Diagnosis not present

## 2020-02-08 DIAGNOSIS — N186 End stage renal disease: Secondary | ICD-10-CM | POA: Diagnosis not present

## 2020-02-08 DIAGNOSIS — N2581 Secondary hyperparathyroidism of renal origin: Secondary | ICD-10-CM | POA: Diagnosis not present

## 2020-02-08 DIAGNOSIS — D509 Iron deficiency anemia, unspecified: Secondary | ICD-10-CM | POA: Diagnosis not present

## 2020-02-08 DIAGNOSIS — Z23 Encounter for immunization: Secondary | ICD-10-CM | POA: Diagnosis not present

## 2020-02-08 DIAGNOSIS — Z992 Dependence on renal dialysis: Secondary | ICD-10-CM | POA: Diagnosis not present

## 2020-02-08 DIAGNOSIS — D631 Anemia in chronic kidney disease: Secondary | ICD-10-CM | POA: Diagnosis not present

## 2020-02-09 DIAGNOSIS — Z992 Dependence on renal dialysis: Secondary | ICD-10-CM | POA: Diagnosis not present

## 2020-02-09 DIAGNOSIS — D509 Iron deficiency anemia, unspecified: Secondary | ICD-10-CM | POA: Diagnosis not present

## 2020-02-09 DIAGNOSIS — N186 End stage renal disease: Secondary | ICD-10-CM | POA: Diagnosis not present

## 2020-02-09 DIAGNOSIS — Z23 Encounter for immunization: Secondary | ICD-10-CM | POA: Diagnosis not present

## 2020-02-09 DIAGNOSIS — D631 Anemia in chronic kidney disease: Secondary | ICD-10-CM | POA: Diagnosis not present

## 2020-02-09 DIAGNOSIS — N2581 Secondary hyperparathyroidism of renal origin: Secondary | ICD-10-CM | POA: Diagnosis not present

## 2020-02-10 DIAGNOSIS — D631 Anemia in chronic kidney disease: Secondary | ICD-10-CM | POA: Diagnosis not present

## 2020-02-10 DIAGNOSIS — Z23 Encounter for immunization: Secondary | ICD-10-CM | POA: Diagnosis not present

## 2020-02-10 DIAGNOSIS — N186 End stage renal disease: Secondary | ICD-10-CM | POA: Diagnosis not present

## 2020-02-10 DIAGNOSIS — Z992 Dependence on renal dialysis: Secondary | ICD-10-CM | POA: Diagnosis not present

## 2020-02-10 DIAGNOSIS — N2581 Secondary hyperparathyroidism of renal origin: Secondary | ICD-10-CM | POA: Diagnosis not present

## 2020-02-10 DIAGNOSIS — D509 Iron deficiency anemia, unspecified: Secondary | ICD-10-CM | POA: Diagnosis not present

## 2020-02-11 ENCOUNTER — Other Ambulatory Visit: Payer: Self-pay | Admitting: Family Medicine

## 2020-02-11 DIAGNOSIS — Z23 Encounter for immunization: Secondary | ICD-10-CM | POA: Diagnosis not present

## 2020-02-11 DIAGNOSIS — D509 Iron deficiency anemia, unspecified: Secondary | ICD-10-CM | POA: Diagnosis not present

## 2020-02-11 DIAGNOSIS — N186 End stage renal disease: Secondary | ICD-10-CM | POA: Diagnosis not present

## 2020-02-11 DIAGNOSIS — Z992 Dependence on renal dialysis: Secondary | ICD-10-CM | POA: Diagnosis not present

## 2020-02-11 DIAGNOSIS — D631 Anemia in chronic kidney disease: Secondary | ICD-10-CM | POA: Diagnosis not present

## 2020-02-11 DIAGNOSIS — N2581 Secondary hyperparathyroidism of renal origin: Secondary | ICD-10-CM | POA: Diagnosis not present

## 2020-02-12 DIAGNOSIS — D509 Iron deficiency anemia, unspecified: Secondary | ICD-10-CM | POA: Diagnosis not present

## 2020-02-12 DIAGNOSIS — D631 Anemia in chronic kidney disease: Secondary | ICD-10-CM | POA: Diagnosis not present

## 2020-02-12 DIAGNOSIS — Z992 Dependence on renal dialysis: Secondary | ICD-10-CM | POA: Diagnosis not present

## 2020-02-12 DIAGNOSIS — N186 End stage renal disease: Secondary | ICD-10-CM | POA: Diagnosis not present

## 2020-02-12 DIAGNOSIS — N2581 Secondary hyperparathyroidism of renal origin: Secondary | ICD-10-CM | POA: Diagnosis not present

## 2020-02-12 DIAGNOSIS — Z23 Encounter for immunization: Secondary | ICD-10-CM | POA: Diagnosis not present

## 2020-02-13 DIAGNOSIS — Z23 Encounter for immunization: Secondary | ICD-10-CM | POA: Diagnosis not present

## 2020-02-13 DIAGNOSIS — Z992 Dependence on renal dialysis: Secondary | ICD-10-CM | POA: Diagnosis not present

## 2020-02-13 DIAGNOSIS — D631 Anemia in chronic kidney disease: Secondary | ICD-10-CM | POA: Diagnosis not present

## 2020-02-13 DIAGNOSIS — N186 End stage renal disease: Secondary | ICD-10-CM | POA: Diagnosis not present

## 2020-02-13 DIAGNOSIS — N2581 Secondary hyperparathyroidism of renal origin: Secondary | ICD-10-CM | POA: Diagnosis not present

## 2020-02-13 DIAGNOSIS — D509 Iron deficiency anemia, unspecified: Secondary | ICD-10-CM | POA: Diagnosis not present

## 2020-02-14 DIAGNOSIS — D631 Anemia in chronic kidney disease: Secondary | ICD-10-CM | POA: Diagnosis not present

## 2020-02-14 DIAGNOSIS — N186 End stage renal disease: Secondary | ICD-10-CM | POA: Diagnosis not present

## 2020-02-14 DIAGNOSIS — D509 Iron deficiency anemia, unspecified: Secondary | ICD-10-CM | POA: Diagnosis not present

## 2020-02-14 DIAGNOSIS — Z992 Dependence on renal dialysis: Secondary | ICD-10-CM | POA: Diagnosis not present

## 2020-02-14 DIAGNOSIS — Z23 Encounter for immunization: Secondary | ICD-10-CM | POA: Diagnosis not present

## 2020-02-14 DIAGNOSIS — N2581 Secondary hyperparathyroidism of renal origin: Secondary | ICD-10-CM | POA: Diagnosis not present

## 2020-02-15 DIAGNOSIS — N186 End stage renal disease: Secondary | ICD-10-CM | POA: Diagnosis not present

## 2020-02-15 DIAGNOSIS — Z992 Dependence on renal dialysis: Secondary | ICD-10-CM | POA: Diagnosis not present

## 2020-02-15 DIAGNOSIS — Z23 Encounter for immunization: Secondary | ICD-10-CM | POA: Diagnosis not present

## 2020-02-15 DIAGNOSIS — D631 Anemia in chronic kidney disease: Secondary | ICD-10-CM | POA: Diagnosis not present

## 2020-02-15 DIAGNOSIS — D509 Iron deficiency anemia, unspecified: Secondary | ICD-10-CM | POA: Diagnosis not present

## 2020-02-15 DIAGNOSIS — N2581 Secondary hyperparathyroidism of renal origin: Secondary | ICD-10-CM | POA: Diagnosis not present

## 2020-02-16 DIAGNOSIS — D509 Iron deficiency anemia, unspecified: Secondary | ICD-10-CM | POA: Diagnosis not present

## 2020-02-16 DIAGNOSIS — Z992 Dependence on renal dialysis: Secondary | ICD-10-CM | POA: Diagnosis not present

## 2020-02-16 DIAGNOSIS — Z23 Encounter for immunization: Secondary | ICD-10-CM | POA: Diagnosis not present

## 2020-02-16 DIAGNOSIS — D631 Anemia in chronic kidney disease: Secondary | ICD-10-CM | POA: Diagnosis not present

## 2020-02-16 DIAGNOSIS — N186 End stage renal disease: Secondary | ICD-10-CM | POA: Diagnosis not present

## 2020-02-16 DIAGNOSIS — N2581 Secondary hyperparathyroidism of renal origin: Secondary | ICD-10-CM | POA: Diagnosis not present

## 2020-02-17 DIAGNOSIS — N2581 Secondary hyperparathyroidism of renal origin: Secondary | ICD-10-CM | POA: Diagnosis not present

## 2020-02-17 DIAGNOSIS — D509 Iron deficiency anemia, unspecified: Secondary | ICD-10-CM | POA: Diagnosis not present

## 2020-02-17 DIAGNOSIS — Z992 Dependence on renal dialysis: Secondary | ICD-10-CM | POA: Diagnosis not present

## 2020-02-17 DIAGNOSIS — N186 End stage renal disease: Secondary | ICD-10-CM | POA: Diagnosis not present

## 2020-02-17 DIAGNOSIS — Z23 Encounter for immunization: Secondary | ICD-10-CM | POA: Diagnosis not present

## 2020-02-17 DIAGNOSIS — D631 Anemia in chronic kidney disease: Secondary | ICD-10-CM | POA: Diagnosis not present

## 2020-02-18 DIAGNOSIS — D509 Iron deficiency anemia, unspecified: Secondary | ICD-10-CM | POA: Diagnosis not present

## 2020-02-18 DIAGNOSIS — N186 End stage renal disease: Secondary | ICD-10-CM | POA: Diagnosis not present

## 2020-02-18 DIAGNOSIS — D631 Anemia in chronic kidney disease: Secondary | ICD-10-CM | POA: Diagnosis not present

## 2020-02-18 DIAGNOSIS — Z23 Encounter for immunization: Secondary | ICD-10-CM | POA: Diagnosis not present

## 2020-02-18 DIAGNOSIS — N2581 Secondary hyperparathyroidism of renal origin: Secondary | ICD-10-CM | POA: Diagnosis not present

## 2020-02-18 DIAGNOSIS — Z992 Dependence on renal dialysis: Secondary | ICD-10-CM | POA: Diagnosis not present

## 2020-02-19 DIAGNOSIS — M48062 Spinal stenosis, lumbar region with neurogenic claudication: Secondary | ICD-10-CM | POA: Diagnosis not present

## 2020-02-19 DIAGNOSIS — M179 Osteoarthritis of knee, unspecified: Secondary | ICD-10-CM | POA: Diagnosis not present

## 2020-02-19 DIAGNOSIS — Z23 Encounter for immunization: Secondary | ICD-10-CM | POA: Diagnosis not present

## 2020-02-19 DIAGNOSIS — D509 Iron deficiency anemia, unspecified: Secondary | ICD-10-CM | POA: Diagnosis not present

## 2020-02-19 DIAGNOSIS — N186 End stage renal disease: Secondary | ICD-10-CM | POA: Diagnosis not present

## 2020-02-19 DIAGNOSIS — N2581 Secondary hyperparathyroidism of renal origin: Secondary | ICD-10-CM | POA: Diagnosis not present

## 2020-02-19 DIAGNOSIS — Z992 Dependence on renal dialysis: Secondary | ICD-10-CM | POA: Diagnosis not present

## 2020-02-19 DIAGNOSIS — D631 Anemia in chronic kidney disease: Secondary | ICD-10-CM | POA: Diagnosis not present

## 2020-02-20 DIAGNOSIS — Z23 Encounter for immunization: Secondary | ICD-10-CM | POA: Diagnosis not present

## 2020-02-20 DIAGNOSIS — Z992 Dependence on renal dialysis: Secondary | ICD-10-CM | POA: Diagnosis not present

## 2020-02-20 DIAGNOSIS — N186 End stage renal disease: Secondary | ICD-10-CM | POA: Diagnosis not present

## 2020-02-20 DIAGNOSIS — N2581 Secondary hyperparathyroidism of renal origin: Secondary | ICD-10-CM | POA: Diagnosis not present

## 2020-02-20 DIAGNOSIS — D509 Iron deficiency anemia, unspecified: Secondary | ICD-10-CM | POA: Diagnosis not present

## 2020-02-20 DIAGNOSIS — D631 Anemia in chronic kidney disease: Secondary | ICD-10-CM | POA: Diagnosis not present

## 2020-02-21 DIAGNOSIS — Z23 Encounter for immunization: Secondary | ICD-10-CM | POA: Diagnosis not present

## 2020-02-21 DIAGNOSIS — D631 Anemia in chronic kidney disease: Secondary | ICD-10-CM | POA: Diagnosis not present

## 2020-02-21 DIAGNOSIS — Z992 Dependence on renal dialysis: Secondary | ICD-10-CM | POA: Diagnosis not present

## 2020-02-21 DIAGNOSIS — D509 Iron deficiency anemia, unspecified: Secondary | ICD-10-CM | POA: Diagnosis not present

## 2020-02-21 DIAGNOSIS — N2581 Secondary hyperparathyroidism of renal origin: Secondary | ICD-10-CM | POA: Diagnosis not present

## 2020-02-21 DIAGNOSIS — N186 End stage renal disease: Secondary | ICD-10-CM | POA: Diagnosis not present

## 2020-02-22 DIAGNOSIS — N186 End stage renal disease: Secondary | ICD-10-CM | POA: Diagnosis not present

## 2020-02-22 DIAGNOSIS — Z992 Dependence on renal dialysis: Secondary | ICD-10-CM | POA: Diagnosis not present

## 2020-02-22 DIAGNOSIS — D509 Iron deficiency anemia, unspecified: Secondary | ICD-10-CM | POA: Diagnosis not present

## 2020-02-22 DIAGNOSIS — Z23 Encounter for immunization: Secondary | ICD-10-CM | POA: Diagnosis not present

## 2020-02-22 DIAGNOSIS — N2581 Secondary hyperparathyroidism of renal origin: Secondary | ICD-10-CM | POA: Diagnosis not present

## 2020-02-22 DIAGNOSIS — D631 Anemia in chronic kidney disease: Secondary | ICD-10-CM | POA: Diagnosis not present

## 2020-02-23 DIAGNOSIS — D509 Iron deficiency anemia, unspecified: Secondary | ICD-10-CM | POA: Diagnosis not present

## 2020-02-23 DIAGNOSIS — D631 Anemia in chronic kidney disease: Secondary | ICD-10-CM | POA: Diagnosis not present

## 2020-02-23 DIAGNOSIS — Z23 Encounter for immunization: Secondary | ICD-10-CM | POA: Diagnosis not present

## 2020-02-23 DIAGNOSIS — N186 End stage renal disease: Secondary | ICD-10-CM | POA: Diagnosis not present

## 2020-02-23 DIAGNOSIS — Z992 Dependence on renal dialysis: Secondary | ICD-10-CM | POA: Diagnosis not present

## 2020-02-23 DIAGNOSIS — N2581 Secondary hyperparathyroidism of renal origin: Secondary | ICD-10-CM | POA: Diagnosis not present

## 2020-02-24 DIAGNOSIS — N186 End stage renal disease: Secondary | ICD-10-CM | POA: Diagnosis not present

## 2020-02-24 DIAGNOSIS — Z23 Encounter for immunization: Secondary | ICD-10-CM | POA: Diagnosis not present

## 2020-02-24 DIAGNOSIS — Z992 Dependence on renal dialysis: Secondary | ICD-10-CM | POA: Diagnosis not present

## 2020-02-24 DIAGNOSIS — D631 Anemia in chronic kidney disease: Secondary | ICD-10-CM | POA: Diagnosis not present

## 2020-02-24 DIAGNOSIS — N2581 Secondary hyperparathyroidism of renal origin: Secondary | ICD-10-CM | POA: Diagnosis not present

## 2020-02-24 DIAGNOSIS — D509 Iron deficiency anemia, unspecified: Secondary | ICD-10-CM | POA: Diagnosis not present

## 2020-02-25 ENCOUNTER — Other Ambulatory Visit: Payer: Self-pay | Admitting: Family Medicine

## 2020-02-25 DIAGNOSIS — Z23 Encounter for immunization: Secondary | ICD-10-CM | POA: Diagnosis not present

## 2020-02-25 DIAGNOSIS — N2581 Secondary hyperparathyroidism of renal origin: Secondary | ICD-10-CM | POA: Diagnosis not present

## 2020-02-25 DIAGNOSIS — D631 Anemia in chronic kidney disease: Secondary | ICD-10-CM | POA: Diagnosis not present

## 2020-02-25 DIAGNOSIS — N186 End stage renal disease: Secondary | ICD-10-CM | POA: Diagnosis not present

## 2020-02-25 DIAGNOSIS — D509 Iron deficiency anemia, unspecified: Secondary | ICD-10-CM | POA: Diagnosis not present

## 2020-02-25 DIAGNOSIS — G8929 Other chronic pain: Secondary | ICD-10-CM

## 2020-02-25 DIAGNOSIS — Z992 Dependence on renal dialysis: Secondary | ICD-10-CM | POA: Diagnosis not present

## 2020-02-25 NOTE — Telephone Encounter (Signed)
Requested medication (s) are due for refill today: Due 02/26/20  Requested medication (s) are on the active medication list: Yes  Last refill:    12/12/19   #150  3 refills  Future visit scheduled Yes  02/29/20  Notes to clinic: not delegated  Requested Prescriptions  Pending Prescriptions Disp Refills   butalbital-acetaminophen-caffeine (FIORICET) 50-325-40 MG tablet [Pharmacy Med Name: BUTALBITAL/ACETAMINOPHEN/CAFF TABS] 150 tablet     Sig: TAKE 1 TO 2 TABLETS BY MOUTH EVERY 4 HOURS AS NEEDED FOR HEADACHE      Not Delegated - Analgesics:  Non-Opioid Analgesic Combinations Failed - 02/25/2020  9:54 AM      Failed - This refill cannot be delegated      Passed - Valid encounter within last 12 months    Recent Outpatient Visits           9 months ago Typical atrial flutter [facility])   [facility] [name], MD   1 year ago CKD (chronic kidney disease) stage 5, GFR less than 15 ml/min [facility])   [facility] [place], Dionne Bucy, MD   1 year ago Cellulitis of lower extremity, unspecified laterality   [facility] [name], MD   1 year ago Cellulitis of lower extremity, unspecified laterality   [facility] [place], Clearnce [place], [place]   1 year ago Leg swelling   [facility] [name], MD

## 2020-02-26 DIAGNOSIS — Z992 Dependence on renal dialysis: Secondary | ICD-10-CM | POA: Diagnosis not present

## 2020-02-26 DIAGNOSIS — D509 Iron deficiency anemia, unspecified: Secondary | ICD-10-CM | POA: Diagnosis not present

## 2020-02-26 DIAGNOSIS — D631 Anemia in chronic kidney disease: Secondary | ICD-10-CM | POA: Diagnosis not present

## 2020-02-26 DIAGNOSIS — Z23 Encounter for immunization: Secondary | ICD-10-CM | POA: Diagnosis not present

## 2020-02-26 DIAGNOSIS — N186 End stage renal disease: Secondary | ICD-10-CM | POA: Diagnosis not present

## 2020-02-26 DIAGNOSIS — N2581 Secondary hyperparathyroidism of renal origin: Secondary | ICD-10-CM | POA: Diagnosis not present

## 2020-02-27 DIAGNOSIS — N186 End stage renal disease: Secondary | ICD-10-CM | POA: Diagnosis not present

## 2020-02-27 DIAGNOSIS — N2581 Secondary hyperparathyroidism of renal origin: Secondary | ICD-10-CM | POA: Diagnosis not present

## 2020-02-27 DIAGNOSIS — D509 Iron deficiency anemia, unspecified: Secondary | ICD-10-CM | POA: Diagnosis not present

## 2020-02-27 DIAGNOSIS — Z992 Dependence on renal dialysis: Secondary | ICD-10-CM | POA: Diagnosis not present

## 2020-02-27 DIAGNOSIS — D631 Anemia in chronic kidney disease: Secondary | ICD-10-CM | POA: Diagnosis not present

## 2020-02-27 DIAGNOSIS — Z23 Encounter for immunization: Secondary | ICD-10-CM | POA: Diagnosis not present

## 2020-02-28 DIAGNOSIS — N2581 Secondary hyperparathyroidism of renal origin: Secondary | ICD-10-CM | POA: Diagnosis not present

## 2020-02-28 DIAGNOSIS — D631 Anemia in chronic kidney disease: Secondary | ICD-10-CM | POA: Diagnosis not present

## 2020-02-28 DIAGNOSIS — N186 End stage renal disease: Secondary | ICD-10-CM | POA: Diagnosis not present

## 2020-02-28 DIAGNOSIS — Z992 Dependence on renal dialysis: Secondary | ICD-10-CM | POA: Diagnosis not present

## 2020-02-28 DIAGNOSIS — Z23 Encounter for immunization: Secondary | ICD-10-CM | POA: Diagnosis not present

## 2020-02-28 DIAGNOSIS — D509 Iron deficiency anemia, unspecified: Secondary | ICD-10-CM | POA: Diagnosis not present

## 2020-02-29 ENCOUNTER — Encounter: Payer: Self-pay | Admitting: Family Medicine

## 2020-02-29 ENCOUNTER — Other Ambulatory Visit: Payer: Self-pay

## 2020-02-29 ENCOUNTER — Ambulatory Visit (INDEPENDENT_AMBULATORY_CARE_PROVIDER_SITE_OTHER): Payer: Medicare Other | Admitting: Family Medicine

## 2020-02-29 VITALS — BP 123/75 | HR 73 | Temp 98.3°F | Resp 16 | Wt 114.0 lb

## 2020-02-29 DIAGNOSIS — K219 Gastro-esophageal reflux disease without esophagitis: Secondary | ICD-10-CM

## 2020-02-29 DIAGNOSIS — M810 Age-related osteoporosis without current pathological fracture: Secondary | ICD-10-CM

## 2020-02-29 DIAGNOSIS — D509 Iron deficiency anemia, unspecified: Secondary | ICD-10-CM | POA: Diagnosis not present

## 2020-02-29 DIAGNOSIS — F418 Other specified anxiety disorders: Secondary | ICD-10-CM | POA: Diagnosis not present

## 2020-02-29 DIAGNOSIS — N2581 Secondary hyperparathyroidism of renal origin: Secondary | ICD-10-CM

## 2020-02-29 DIAGNOSIS — Z1231 Encounter for screening mammogram for malignant neoplasm of breast: Secondary | ICD-10-CM

## 2020-02-29 DIAGNOSIS — I10 Essential (primary) hypertension: Secondary | ICD-10-CM

## 2020-02-29 DIAGNOSIS — I130 Hypertensive heart and chronic kidney disease with heart failure and stage 1 through stage 4 chronic kidney disease, or unspecified chronic kidney disease: Secondary | ICD-10-CM | POA: Insufficient documentation

## 2020-02-29 DIAGNOSIS — D631 Anemia in chronic kidney disease: Secondary | ICD-10-CM | POA: Diagnosis not present

## 2020-02-29 DIAGNOSIS — Z992 Dependence on renal dialysis: Secondary | ICD-10-CM | POA: Diagnosis not present

## 2020-02-29 DIAGNOSIS — Z23 Encounter for immunization: Secondary | ICD-10-CM | POA: Diagnosis not present

## 2020-02-29 DIAGNOSIS — N186 End stage renal disease: Secondary | ICD-10-CM | POA: Diagnosis not present

## 2020-02-29 NOTE — Progress Notes (Signed)
Established patient visit   Patient: Amy [name]   DOB: [birth year]   69 y.o. Female  MRN: [id number] Visit Date: 02/29/2020  Today's healthcare provider: Lelon Huh, MD   Chief Complaint  Patient presents with  . Anemia  . Migraine  . Insomnia  . Chronic Kidney Disease   Subjective    Had AWV with [facility] on 07/19/2019   HPI  Follow up for Chronic headaches:  The patient was last seen for this more than 6 months ago.  Changes made at last visit include none.  She reports good compliance with treatment. She feels that condition is Unchanged. She is not having side effects.   -----------------------------------------------------------------------------------------  Follow up for Insomnia:  The patient was last seen for this more than 6 months ago.   Changes made at last visit include none.  She reports good compliance with treatment. She feels that condition is Unchanged. She is not having side effects.   -----------------------------------------------------------------------------------------  Follow up for typical atrial flutter:  The patient was last seen for this 9 months ago. Changes made at last visit include none; continue Eliquis.  She reports good compliance with treatment. Since last visit patient states she was taken off of Eliquis.  She feels that condition is Unchanged. She is not having side effects.   -----------------------------------------------------------------------------------------  Follow up for Anemia:  The patient was last seen for this 9 months ago. Changes made at last visit include none.  She reports good compliance with treatment. She feels that condition is Unchanged. She is not having side effects.  She states she is now on Epogen given at dialysis center.  -----------------------------------------------------------------------------------------  Follow up for End stage renal disease and CKD:  The patient was last  seen for this 9 months ago. Changes made at last visit include none.  She reports good compliance with treatment. She feels that condition is Worse. Patient has been doing dialysis at home every night. She is not having side effects.   -----------------------------------------------------------------------------------------       Medications: Outpatient Medications Prior to Visit  Medication Sig  . amLODipine (NORVASC) 10 MG tablet Take 10 mg by mouth daily.  [name] b complex-vitamin c-folic acid (NEPHRO-VITE) 0.8 MG TABS tablet Take 1 tablet by mouth daily.  [name] buPROPion (WELLBUTRIN) 75 MG tablet TAKE 1 TABLET(75 MG) BY MOUTH TWICE DAILY  . butalbital-acetaminophen-caffeine (FIORICET) 50-325-40 MG tablet TAKE 1 TO 2 TABLETS BY MOUTH EVERY 4 HOURS AS NEEDED FOR HEADACHE  . carvedilol (COREG) 25 MG tablet Take 25 mg by mouth 2 (two) times daily.  [name] Epoetin Alfa (EPOGEN IJ) Inject as directed.  [name] esomeprazole (NEXIUM) 20 MG capsule Take 20 mg by mouth daily.  [name] LORazepam (ATIVAN) 1 MG tablet Take 1 tablet (1 mg total) by mouth 2 (two) times daily as needed.  [name] losartan (COZAAR) 100 MG tablet Take 100 mg by mouth at bedtime.  . valACYclovir (VALTREX) 1000 MG tablet Take 1,000 mg by mouth 2 (two) times daily as needed.  . zolpidem (AMBIEN) 10 MG tablet TAKE 1/2 TO 1 TABLET(5 TO 10 MG) BY MOUTH AT BEDTIME AS NEEDED  . cloNIDine (CATAPRES) 0.2 MG tablet Take 1 tablet (0.2 mg total) by mouth 2 (two) times daily.  . hydrALAZINE (APRESOLINE) 50 MG tablet Take 1 tablet (50 mg total) by mouth 3 (three) times daily as needed. For SBP > 160 mm hg   No facility-administered medications prior to visit.    Review of Systems  Constitutional: Negative for chills, fatigue and fever.  HENT: Negative for congestion, ear pain, rhinorrhea, sneezing and sore throat.   Eyes: Negative.  Negative for pain and redness.  Respiratory: Negative for cough, shortness of breath and wheezing.   Cardiovascular: Negative  for chest pain and leg swelling.  Gastrointestinal: Negative for abdominal pain, blood in stool, constipation, diarrhea and nausea.  Endocrine: Negative for polydipsia and polyphagia.  Genitourinary: Negative.  Negative for dysuria, flank pain, hematuria, pelvic pain, vaginal bleeding and vaginal discharge.  Musculoskeletal: Negative for arthralgias, back pain, gait problem and joint swelling.  Skin: Negative for rash.  Neurological: Negative.  Negative for dizziness, tremors, seizures, weakness, light-headedness, numbness and headaches.  Hematological: Negative for adenopathy.  Psychiatric/Behavioral: Negative.  Negative for behavioral problems, confusion and dysphoric mood. The patient is not nervous/anxious and is not hyperactive.       Objective    BP 123/75 (BP Location: Left Arm, Patient Position: Sitting, Cuff Size: Normal)   Pulse 73   Temp 98.3 F (36.8 C) (Oral)   Resp 16   Wt 114 lb (51.7 kg)   BMI 19.57 kg/m    Physical Exam    General: Appearance:    Thin female in no acute distress  Eyes:    PERRL, conjunctiva/corneas clear, EOM's intact       Lungs:     Clear to auscultation bilaterally, respirations unlabored  Heart:    Normal heart rate. Irregularly irregular rhythm. No murmurs, rubs, or gallops.   MS:   All extremities are intact.   Neurologic:   Awake, alert, oriented x 3. No apparent focal neurological           defect.          Assessment & Plan     1. Hyperparathyroidism, secondary renal ([place]) Stable, continue follow up with nephrology.   2. Osteoporosis without current pathological fracture, unspecified osteoporosis type Due for follow up dexascan ordered today.   3. ESRD on dialysis [facility]) Doing relative well with home dialysis managed by Dr. Candiss Norse  4. Depression with anxiety Continue current dose of bupropion and prn alprazolam.   5. . Primary hypertension Continue current dose of carvedilol and losartan and clonidine - Lipid panel  6.  GERD Doing well with current dose of Nexium   5. Breast cancer screening Routine mammogram schedule at [place].   The entirety of the information documented in the History of Present Illness, Review of Systems and Physical Exam were personally obtained by me. Portions of this information were initially documented by the CMA and reviewed by me for thoroughness and accuracy.      Lelon Huh, MD  [facility] [phone] (phone) [phone] (fax)  [place]

## 2020-02-29 NOTE — Patient Instructions (Addendum)
.   Please review the attached list of medications and notify my office if there are any errors.   . Please bring all of your medications to every appointment so we can make sure that our medication list is the same as yours.   Your mammogram appointment has been scheduled at [facility] on 03/10/2020 at 1:30pm. Please call their office if this date needs to be rescheduled [phone].

## 2020-03-01 DIAGNOSIS — D631 Anemia in chronic kidney disease: Secondary | ICD-10-CM | POA: Diagnosis not present

## 2020-03-01 DIAGNOSIS — Z992 Dependence on renal dialysis: Secondary | ICD-10-CM | POA: Diagnosis not present

## 2020-03-01 DIAGNOSIS — Z23 Encounter for immunization: Secondary | ICD-10-CM | POA: Diagnosis not present

## 2020-03-01 DIAGNOSIS — N186 End stage renal disease: Secondary | ICD-10-CM | POA: Diagnosis not present

## 2020-03-01 DIAGNOSIS — N2581 Secondary hyperparathyroidism of renal origin: Secondary | ICD-10-CM | POA: Diagnosis not present

## 2020-03-01 DIAGNOSIS — D509 Iron deficiency anemia, unspecified: Secondary | ICD-10-CM | POA: Diagnosis not present

## 2020-03-02 DIAGNOSIS — N2581 Secondary hyperparathyroidism of renal origin: Secondary | ICD-10-CM | POA: Diagnosis not present

## 2020-03-02 DIAGNOSIS — Z992 Dependence on renal dialysis: Secondary | ICD-10-CM | POA: Diagnosis not present

## 2020-03-02 DIAGNOSIS — D631 Anemia in chronic kidney disease: Secondary | ICD-10-CM | POA: Diagnosis not present

## 2020-03-02 DIAGNOSIS — Z23 Encounter for immunization: Secondary | ICD-10-CM | POA: Diagnosis not present

## 2020-03-02 DIAGNOSIS — D509 Iron deficiency anemia, unspecified: Secondary | ICD-10-CM | POA: Diagnosis not present

## 2020-03-02 DIAGNOSIS — N186 End stage renal disease: Secondary | ICD-10-CM | POA: Diagnosis not present

## 2020-03-03 DIAGNOSIS — D509 Iron deficiency anemia, unspecified: Secondary | ICD-10-CM | POA: Diagnosis not present

## 2020-03-03 DIAGNOSIS — N186 End stage renal disease: Secondary | ICD-10-CM | POA: Diagnosis not present

## 2020-03-03 DIAGNOSIS — Z992 Dependence on renal dialysis: Secondary | ICD-10-CM | POA: Diagnosis not present

## 2020-03-03 DIAGNOSIS — D631 Anemia in chronic kidney disease: Secondary | ICD-10-CM | POA: Diagnosis not present

## 2020-03-04 ENCOUNTER — Encounter: Payer: Self-pay | Admitting: Family Medicine

## 2020-03-04 DIAGNOSIS — D631 Anemia in chronic kidney disease: Secondary | ICD-10-CM | POA: Diagnosis not present

## 2020-03-04 DIAGNOSIS — D509 Iron deficiency anemia, unspecified: Secondary | ICD-10-CM | POA: Diagnosis not present

## 2020-03-04 DIAGNOSIS — Z992 Dependence on renal dialysis: Secondary | ICD-10-CM | POA: Diagnosis not present

## 2020-03-04 DIAGNOSIS — N186 End stage renal disease: Secondary | ICD-10-CM | POA: Diagnosis not present

## 2020-03-05 DIAGNOSIS — D631 Anemia in chronic kidney disease: Secondary | ICD-10-CM | POA: Diagnosis not present

## 2020-03-05 DIAGNOSIS — Z992 Dependence on renal dialysis: Secondary | ICD-10-CM | POA: Diagnosis not present

## 2020-03-05 DIAGNOSIS — D509 Iron deficiency anemia, unspecified: Secondary | ICD-10-CM | POA: Diagnosis not present

## 2020-03-05 DIAGNOSIS — N186 End stage renal disease: Secondary | ICD-10-CM | POA: Diagnosis not present

## 2020-03-06 DIAGNOSIS — D631 Anemia in chronic kidney disease: Secondary | ICD-10-CM | POA: Diagnosis not present

## 2020-03-06 DIAGNOSIS — N186 End stage renal disease: Secondary | ICD-10-CM | POA: Diagnosis not present

## 2020-03-06 DIAGNOSIS — Z992 Dependence on renal dialysis: Secondary | ICD-10-CM | POA: Diagnosis not present

## 2020-03-06 DIAGNOSIS — D509 Iron deficiency anemia, unspecified: Secondary | ICD-10-CM | POA: Diagnosis not present

## 2020-03-07 DIAGNOSIS — N186 End stage renal disease: Secondary | ICD-10-CM | POA: Diagnosis not present

## 2020-03-07 DIAGNOSIS — D509 Iron deficiency anemia, unspecified: Secondary | ICD-10-CM | POA: Diagnosis not present

## 2020-03-07 DIAGNOSIS — D631 Anemia in chronic kidney disease: Secondary | ICD-10-CM | POA: Diagnosis not present

## 2020-03-07 DIAGNOSIS — Z992 Dependence on renal dialysis: Secondary | ICD-10-CM | POA: Diagnosis not present

## 2020-03-08 DIAGNOSIS — Z992 Dependence on renal dialysis: Secondary | ICD-10-CM | POA: Diagnosis not present

## 2020-03-08 DIAGNOSIS — D509 Iron deficiency anemia, unspecified: Secondary | ICD-10-CM | POA: Diagnosis not present

## 2020-03-08 DIAGNOSIS — N186 End stage renal disease: Secondary | ICD-10-CM | POA: Diagnosis not present

## 2020-03-08 DIAGNOSIS — D631 Anemia in chronic kidney disease: Secondary | ICD-10-CM | POA: Diagnosis not present

## 2020-03-09 DIAGNOSIS — D631 Anemia in chronic kidney disease: Secondary | ICD-10-CM | POA: Diagnosis not present

## 2020-03-09 DIAGNOSIS — N186 End stage renal disease: Secondary | ICD-10-CM | POA: Diagnosis not present

## 2020-03-09 DIAGNOSIS — D509 Iron deficiency anemia, unspecified: Secondary | ICD-10-CM | POA: Diagnosis not present

## 2020-03-09 DIAGNOSIS — Z992 Dependence on renal dialysis: Secondary | ICD-10-CM | POA: Diagnosis not present

## 2020-03-10 DIAGNOSIS — D509 Iron deficiency anemia, unspecified: Secondary | ICD-10-CM | POA: Diagnosis not present

## 2020-03-10 DIAGNOSIS — N186 End stage renal disease: Secondary | ICD-10-CM | POA: Diagnosis not present

## 2020-03-10 DIAGNOSIS — D631 Anemia in chronic kidney disease: Secondary | ICD-10-CM | POA: Diagnosis not present

## 2020-03-10 DIAGNOSIS — Z992 Dependence on renal dialysis: Secondary | ICD-10-CM | POA: Diagnosis not present

## 2020-03-11 DIAGNOSIS — D631 Anemia in chronic kidney disease: Secondary | ICD-10-CM | POA: Diagnosis not present

## 2020-03-11 DIAGNOSIS — N186 End stage renal disease: Secondary | ICD-10-CM | POA: Diagnosis not present

## 2020-03-11 DIAGNOSIS — Z992 Dependence on renal dialysis: Secondary | ICD-10-CM | POA: Diagnosis not present

## 2020-03-11 DIAGNOSIS — D509 Iron deficiency anemia, unspecified: Secondary | ICD-10-CM | POA: Diagnosis not present

## 2020-03-12 ENCOUNTER — Other Ambulatory Visit: Payer: Self-pay | Admitting: Family Medicine

## 2020-03-12 DIAGNOSIS — D631 Anemia in chronic kidney disease: Secondary | ICD-10-CM | POA: Diagnosis not present

## 2020-03-12 DIAGNOSIS — D509 Iron deficiency anemia, unspecified: Secondary | ICD-10-CM | POA: Diagnosis not present

## 2020-03-12 DIAGNOSIS — N186 End stage renal disease: Secondary | ICD-10-CM | POA: Diagnosis not present

## 2020-03-12 DIAGNOSIS — G8929 Other chronic pain: Secondary | ICD-10-CM

## 2020-03-12 DIAGNOSIS — Z992 Dependence on renal dialysis: Secondary | ICD-10-CM | POA: Diagnosis not present

## 2020-03-12 DIAGNOSIS — R519 Headache, unspecified: Secondary | ICD-10-CM

## 2020-03-12 NOTE — Telephone Encounter (Signed)
Requested medication (s) are due for refill today: yes  Requested medication (s) are on the active medication list: yes  Last refill:  02/26/2020  Future visit scheduled: no  Notes to clinic:  this refill cannot be delegated   Requested Prescriptions  Pending Prescriptions Disp Refills   butalbital-acetaminophen-caffeine (FIORICET) 50-325-40 MG tablet [Pharmacy Med Name: BUTALBITAL/ACETAMINOPHEN/CAFF TABS] 150 tablet     Sig: TAKE 1 TO 2 TABLETS BY MOUTH EVERY 4 HOURS AS NEEDED FOR HEADACHE      Not Delegated - Analgesics:  Non-Opioid Analgesic Combinations Failed - 03/12/2020  8:07 AM      Failed - This refill cannot be delegated      Passed - Valid encounter within last 12 months    Recent Outpatient Visits           1 week ago Hyperparathyroidism, secondary renal [facility])   [facility] [name], MD   10 months ago Typical atrial flutter [facility])   [facility] [name], MD   1 year ago CKD (chronic kidney disease) stage 5, GFR less than 15 ml/min [facility])   [facility] [place], Dionne Bucy, MD   1 year ago Cellulitis of lower extremity, unspecified laterality   [facility] [name], MD   1 year ago Cellulitis of lower extremity, unspecified laterality   [facility] [place], [place], [place]

## 2020-03-13 DIAGNOSIS — D509 Iron deficiency anemia, unspecified: Secondary | ICD-10-CM | POA: Diagnosis not present

## 2020-03-13 DIAGNOSIS — Z992 Dependence on renal dialysis: Secondary | ICD-10-CM | POA: Diagnosis not present

## 2020-03-13 DIAGNOSIS — D631 Anemia in chronic kidney disease: Secondary | ICD-10-CM | POA: Diagnosis not present

## 2020-03-13 DIAGNOSIS — N186 End stage renal disease: Secondary | ICD-10-CM | POA: Diagnosis not present

## 2020-03-14 DIAGNOSIS — D509 Iron deficiency anemia, unspecified: Secondary | ICD-10-CM | POA: Diagnosis not present

## 2020-03-14 DIAGNOSIS — N186 End stage renal disease: Secondary | ICD-10-CM | POA: Diagnosis not present

## 2020-03-14 DIAGNOSIS — Z992 Dependence on renal dialysis: Secondary | ICD-10-CM | POA: Diagnosis not present

## 2020-03-14 DIAGNOSIS — D631 Anemia in chronic kidney disease: Secondary | ICD-10-CM | POA: Diagnosis not present

## 2020-03-15 DIAGNOSIS — N186 End stage renal disease: Secondary | ICD-10-CM | POA: Diagnosis not present

## 2020-03-15 DIAGNOSIS — Z992 Dependence on renal dialysis: Secondary | ICD-10-CM | POA: Diagnosis not present

## 2020-03-15 DIAGNOSIS — D509 Iron deficiency anemia, unspecified: Secondary | ICD-10-CM | POA: Diagnosis not present

## 2020-03-15 DIAGNOSIS — D631 Anemia in chronic kidney disease: Secondary | ICD-10-CM | POA: Diagnosis not present

## 2020-03-16 ENCOUNTER — Other Ambulatory Visit: Payer: Self-pay | Admitting: Family Medicine

## 2020-03-16 DIAGNOSIS — Z992 Dependence on renal dialysis: Secondary | ICD-10-CM | POA: Diagnosis not present

## 2020-03-16 DIAGNOSIS — D509 Iron deficiency anemia, unspecified: Secondary | ICD-10-CM | POA: Diagnosis not present

## 2020-03-16 DIAGNOSIS — N186 End stage renal disease: Secondary | ICD-10-CM | POA: Diagnosis not present

## 2020-03-16 DIAGNOSIS — D631 Anemia in chronic kidney disease: Secondary | ICD-10-CM | POA: Diagnosis not present

## 2020-03-16 NOTE — Telephone Encounter (Signed)
Requested medication (s) are due for refill today: no  Requested medication (s) are on the active medication list: yes  Last refill: 02/05/2020  Future visit scheduled:no  Notes to clinic:  this refill cannot be delegated    Requested Prescriptions  Pending Prescriptions Disp Refills   LORazepam (ATIVAN) 1 MG tablet [Pharmacy Med Name: LORAZEPAM 1MG  TABLETS] 180 tablet     Sig: TAKE 1 TABLET(1 MG) BY MOUTH TWICE DAILY AS NEEDED      Not Delegated - Psychiatry:  Anxiolytics/Hypnotics Failed - 03/16/2020 11:29 AM      Failed - This refill cannot be delegated      Failed - Valid encounter within last 6 months    Recent Outpatient Visits           2 weeks ago Hyperparathyroidism, secondary renal [facility])   [facility] [name], MD   10 months ago Typical atrial flutter [facility])   [facility] [name], MD   1 year ago CKD (chronic kidney disease) stage 5, GFR less than 15 ml/min [facility])   [facility] [place], Dionne Bucy, MD   1 year ago Cellulitis of lower extremity, unspecified laterality   [facility] [name], MD   1 year ago Cellulitis of lower extremity, unspecified laterality   [place], [place]              Passed - Urine Drug Screen completed in last 360 days

## 2020-03-17 DIAGNOSIS — N186 End stage renal disease: Secondary | ICD-10-CM | POA: Diagnosis not present

## 2020-03-17 DIAGNOSIS — D631 Anemia in chronic kidney disease: Secondary | ICD-10-CM | POA: Diagnosis not present

## 2020-03-17 DIAGNOSIS — D509 Iron deficiency anemia, unspecified: Secondary | ICD-10-CM | POA: Diagnosis not present

## 2020-03-17 DIAGNOSIS — Z992 Dependence on renal dialysis: Secondary | ICD-10-CM | POA: Diagnosis not present

## 2020-03-18 DIAGNOSIS — N186 End stage renal disease: Secondary | ICD-10-CM | POA: Diagnosis not present

## 2020-03-18 DIAGNOSIS — D509 Iron deficiency anemia, unspecified: Secondary | ICD-10-CM | POA: Diagnosis not present

## 2020-03-18 DIAGNOSIS — Z992 Dependence on renal dialysis: Secondary | ICD-10-CM | POA: Diagnosis not present

## 2020-03-18 DIAGNOSIS — D631 Anemia in chronic kidney disease: Secondary | ICD-10-CM | POA: Diagnosis not present

## 2020-03-19 DIAGNOSIS — D631 Anemia in chronic kidney disease: Secondary | ICD-10-CM | POA: Diagnosis not present

## 2020-03-19 DIAGNOSIS — Z992 Dependence on renal dialysis: Secondary | ICD-10-CM | POA: Diagnosis not present

## 2020-03-19 DIAGNOSIS — D509 Iron deficiency anemia, unspecified: Secondary | ICD-10-CM | POA: Diagnosis not present

## 2020-03-19 DIAGNOSIS — N186 End stage renal disease: Secondary | ICD-10-CM | POA: Diagnosis not present

## 2020-03-20 DIAGNOSIS — Z992 Dependence on renal dialysis: Secondary | ICD-10-CM | POA: Diagnosis not present

## 2020-03-20 DIAGNOSIS — D631 Anemia in chronic kidney disease: Secondary | ICD-10-CM | POA: Diagnosis not present

## 2020-03-20 DIAGNOSIS — D509 Iron deficiency anemia, unspecified: Secondary | ICD-10-CM | POA: Diagnosis not present

## 2020-03-20 DIAGNOSIS — N186 End stage renal disease: Secondary | ICD-10-CM | POA: Diagnosis not present

## 2020-03-21 DIAGNOSIS — D631 Anemia in chronic kidney disease: Secondary | ICD-10-CM | POA: Diagnosis not present

## 2020-03-21 DIAGNOSIS — D509 Iron deficiency anemia, unspecified: Secondary | ICD-10-CM | POA: Diagnosis not present

## 2020-03-21 DIAGNOSIS — N186 End stage renal disease: Secondary | ICD-10-CM | POA: Diagnosis not present

## 2020-03-21 DIAGNOSIS — Z992 Dependence on renal dialysis: Secondary | ICD-10-CM | POA: Diagnosis not present

## 2020-03-22 DIAGNOSIS — N186 End stage renal disease: Secondary | ICD-10-CM | POA: Diagnosis not present

## 2020-03-22 DIAGNOSIS — D631 Anemia in chronic kidney disease: Secondary | ICD-10-CM | POA: Diagnosis not present

## 2020-03-22 DIAGNOSIS — D509 Iron deficiency anemia, unspecified: Secondary | ICD-10-CM | POA: Diagnosis not present

## 2020-03-22 DIAGNOSIS — Z992 Dependence on renal dialysis: Secondary | ICD-10-CM | POA: Diagnosis not present

## 2020-03-23 DIAGNOSIS — D631 Anemia in chronic kidney disease: Secondary | ICD-10-CM | POA: Diagnosis not present

## 2020-03-23 DIAGNOSIS — N186 End stage renal disease: Secondary | ICD-10-CM | POA: Diagnosis not present

## 2020-03-23 DIAGNOSIS — Z992 Dependence on renal dialysis: Secondary | ICD-10-CM | POA: Diagnosis not present

## 2020-03-23 DIAGNOSIS — D509 Iron deficiency anemia, unspecified: Secondary | ICD-10-CM | POA: Diagnosis not present

## 2020-03-24 ENCOUNTER — Other Ambulatory Visit: Payer: Self-pay | Admitting: Family Medicine

## 2020-03-24 DIAGNOSIS — I10 Essential (primary) hypertension: Secondary | ICD-10-CM

## 2020-03-24 DIAGNOSIS — D509 Iron deficiency anemia, unspecified: Secondary | ICD-10-CM | POA: Diagnosis not present

## 2020-03-24 DIAGNOSIS — D631 Anemia in chronic kidney disease: Secondary | ICD-10-CM | POA: Diagnosis not present

## 2020-03-24 DIAGNOSIS — Z992 Dependence on renal dialysis: Secondary | ICD-10-CM | POA: Diagnosis not present

## 2020-03-24 DIAGNOSIS — N186 End stage renal disease: Secondary | ICD-10-CM | POA: Diagnosis not present

## 2020-03-24 NOTE — Telephone Encounter (Signed)
Please advise refill? 

## 2020-03-24 NOTE — Telephone Encounter (Signed)
° °  Notes to clinic:  medication filled by a historical provider Review for fill   Requested Prescriptions  Pending Prescriptions Disp Refills   carvedilol (COREG) 25 MG tablet [Pharmacy Med Name: CARVEDILOL 25MG  TABLETS] 180 tablet     Sig: TAKE 1 TABLET(25 MG) BY MOUTH TWICE DAILY WITH A MEAL      Cardiovascular:  Beta Blockers Failed - 03/24/2020 11:23 AM      Failed - Valid encounter within last 6 months    Recent Outpatient Visits           3 weeks ago Hyperparathyroidism, secondary renal [facility])   [facility] [name], MD   10 months ago Typical atrial flutter [facility])   [facility] [name], MD   1 year ago CKD (chronic kidney disease) stage 5, GFR less than 15 ml/min [facility])   [facility] [place], Dionne Bucy, MD   1 year ago Cellulitis of lower extremity, unspecified laterality   [facility] [name], MD   1 year ago Cellulitis of lower extremity, unspecified laterality   [facility], [place]              Passed - Last BP in normal range    BP Readings from Last 1 Encounters:  02/29/20 123/75          Passed - Last Heart Rate in normal range    Pulse Readings from Last 1 Encounters:  02/29/20 73

## 2020-03-25 DIAGNOSIS — D631 Anemia in chronic kidney disease: Secondary | ICD-10-CM | POA: Diagnosis not present

## 2020-03-25 DIAGNOSIS — D509 Iron deficiency anemia, unspecified: Secondary | ICD-10-CM | POA: Diagnosis not present

## 2020-03-25 DIAGNOSIS — Z992 Dependence on renal dialysis: Secondary | ICD-10-CM | POA: Diagnosis not present

## 2020-03-25 DIAGNOSIS — N186 End stage renal disease: Secondary | ICD-10-CM | POA: Diagnosis not present

## 2020-03-26 DIAGNOSIS — D509 Iron deficiency anemia, unspecified: Secondary | ICD-10-CM | POA: Diagnosis not present

## 2020-03-26 DIAGNOSIS — Z992 Dependence on renal dialysis: Secondary | ICD-10-CM | POA: Diagnosis not present

## 2020-03-26 DIAGNOSIS — D631 Anemia in chronic kidney disease: Secondary | ICD-10-CM | POA: Diagnosis not present

## 2020-03-26 DIAGNOSIS — N186 End stage renal disease: Secondary | ICD-10-CM | POA: Diagnosis not present

## 2020-03-27 DIAGNOSIS — N186 End stage renal disease: Secondary | ICD-10-CM | POA: Diagnosis not present

## 2020-03-27 DIAGNOSIS — D631 Anemia in chronic kidney disease: Secondary | ICD-10-CM | POA: Diagnosis not present

## 2020-03-27 DIAGNOSIS — Z992 Dependence on renal dialysis: Secondary | ICD-10-CM | POA: Diagnosis not present

## 2020-03-27 DIAGNOSIS — D509 Iron deficiency anemia, unspecified: Secondary | ICD-10-CM | POA: Diagnosis not present

## 2020-03-28 DIAGNOSIS — Z992 Dependence on renal dialysis: Secondary | ICD-10-CM | POA: Diagnosis not present

## 2020-03-28 DIAGNOSIS — N186 End stage renal disease: Secondary | ICD-10-CM | POA: Diagnosis not present

## 2020-03-28 DIAGNOSIS — D631 Anemia in chronic kidney disease: Secondary | ICD-10-CM | POA: Diagnosis not present

## 2020-03-28 DIAGNOSIS — D509 Iron deficiency anemia, unspecified: Secondary | ICD-10-CM | POA: Diagnosis not present

## 2020-03-29 DIAGNOSIS — Z992 Dependence on renal dialysis: Secondary | ICD-10-CM | POA: Diagnosis not present

## 2020-03-29 DIAGNOSIS — D509 Iron deficiency anemia, unspecified: Secondary | ICD-10-CM | POA: Diagnosis not present

## 2020-03-29 DIAGNOSIS — D631 Anemia in chronic kidney disease: Secondary | ICD-10-CM | POA: Diagnosis not present

## 2020-03-29 DIAGNOSIS — N186 End stage renal disease: Secondary | ICD-10-CM | POA: Diagnosis not present

## 2020-03-30 DIAGNOSIS — D509 Iron deficiency anemia, unspecified: Secondary | ICD-10-CM | POA: Diagnosis not present

## 2020-03-30 DIAGNOSIS — D631 Anemia in chronic kidney disease: Secondary | ICD-10-CM | POA: Diagnosis not present

## 2020-03-30 DIAGNOSIS — N186 End stage renal disease: Secondary | ICD-10-CM | POA: Diagnosis not present

## 2020-03-30 DIAGNOSIS — Z992 Dependence on renal dialysis: Secondary | ICD-10-CM | POA: Diagnosis not present

## 2020-03-31 DIAGNOSIS — N186 End stage renal disease: Secondary | ICD-10-CM | POA: Diagnosis not present

## 2020-03-31 DIAGNOSIS — D631 Anemia in chronic kidney disease: Secondary | ICD-10-CM | POA: Diagnosis not present

## 2020-03-31 DIAGNOSIS — D509 Iron deficiency anemia, unspecified: Secondary | ICD-10-CM | POA: Diagnosis not present

## 2020-03-31 DIAGNOSIS — Z992 Dependence on renal dialysis: Secondary | ICD-10-CM | POA: Diagnosis not present

## 2020-04-01 DIAGNOSIS — Z992 Dependence on renal dialysis: Secondary | ICD-10-CM | POA: Diagnosis not present

## 2020-04-01 DIAGNOSIS — N186 End stage renal disease: Secondary | ICD-10-CM | POA: Diagnosis not present

## 2020-04-01 DIAGNOSIS — D509 Iron deficiency anemia, unspecified: Secondary | ICD-10-CM | POA: Diagnosis not present

## 2020-04-01 DIAGNOSIS — D631 Anemia in chronic kidney disease: Secondary | ICD-10-CM | POA: Diagnosis not present

## 2020-04-02 DIAGNOSIS — N186 End stage renal disease: Secondary | ICD-10-CM | POA: Diagnosis not present

## 2020-04-02 DIAGNOSIS — D509 Iron deficiency anemia, unspecified: Secondary | ICD-10-CM | POA: Diagnosis not present

## 2020-04-02 DIAGNOSIS — Z992 Dependence on renal dialysis: Secondary | ICD-10-CM | POA: Diagnosis not present

## 2020-04-02 DIAGNOSIS — N2581 Secondary hyperparathyroidism of renal origin: Secondary | ICD-10-CM | POA: Diagnosis not present

## 2020-04-02 DIAGNOSIS — M5416 Radiculopathy, lumbar region: Secondary | ICD-10-CM | POA: Diagnosis not present

## 2020-04-03 DIAGNOSIS — D509 Iron deficiency anemia, unspecified: Secondary | ICD-10-CM | POA: Diagnosis not present

## 2020-04-03 DIAGNOSIS — N2581 Secondary hyperparathyroidism of renal origin: Secondary | ICD-10-CM | POA: Diagnosis not present

## 2020-04-03 DIAGNOSIS — N186 End stage renal disease: Secondary | ICD-10-CM | POA: Diagnosis not present

## 2020-04-03 DIAGNOSIS — Z992 Dependence on renal dialysis: Secondary | ICD-10-CM | POA: Diagnosis not present

## 2020-04-04 DIAGNOSIS — Z992 Dependence on renal dialysis: Secondary | ICD-10-CM | POA: Diagnosis not present

## 2020-04-04 DIAGNOSIS — D509 Iron deficiency anemia, unspecified: Secondary | ICD-10-CM | POA: Diagnosis not present

## 2020-04-04 DIAGNOSIS — N2581 Secondary hyperparathyroidism of renal origin: Secondary | ICD-10-CM | POA: Diagnosis not present

## 2020-04-04 DIAGNOSIS — N186 End stage renal disease: Secondary | ICD-10-CM | POA: Diagnosis not present

## 2020-04-05 DIAGNOSIS — N2581 Secondary hyperparathyroidism of renal origin: Secondary | ICD-10-CM | POA: Diagnosis not present

## 2020-04-05 DIAGNOSIS — N186 End stage renal disease: Secondary | ICD-10-CM | POA: Diagnosis not present

## 2020-04-05 DIAGNOSIS — D509 Iron deficiency anemia, unspecified: Secondary | ICD-10-CM | POA: Diagnosis not present

## 2020-04-05 DIAGNOSIS — Z992 Dependence on renal dialysis: Secondary | ICD-10-CM | POA: Diagnosis not present

## 2020-04-06 DIAGNOSIS — Z992 Dependence on renal dialysis: Secondary | ICD-10-CM | POA: Diagnosis not present

## 2020-04-06 DIAGNOSIS — N186 End stage renal disease: Secondary | ICD-10-CM | POA: Diagnosis not present

## 2020-04-06 DIAGNOSIS — D509 Iron deficiency anemia, unspecified: Secondary | ICD-10-CM | POA: Diagnosis not present

## 2020-04-06 DIAGNOSIS — N2581 Secondary hyperparathyroidism of renal origin: Secondary | ICD-10-CM | POA: Diagnosis not present

## 2020-04-07 DIAGNOSIS — D509 Iron deficiency anemia, unspecified: Secondary | ICD-10-CM | POA: Diagnosis not present

## 2020-04-07 DIAGNOSIS — N2581 Secondary hyperparathyroidism of renal origin: Secondary | ICD-10-CM | POA: Diagnosis not present

## 2020-04-07 DIAGNOSIS — Z992 Dependence on renal dialysis: Secondary | ICD-10-CM | POA: Diagnosis not present

## 2020-04-07 DIAGNOSIS — N186 End stage renal disease: Secondary | ICD-10-CM | POA: Diagnosis not present

## 2020-04-08 DIAGNOSIS — D509 Iron deficiency anemia, unspecified: Secondary | ICD-10-CM | POA: Diagnosis not present

## 2020-04-08 DIAGNOSIS — Z992 Dependence on renal dialysis: Secondary | ICD-10-CM | POA: Diagnosis not present

## 2020-04-08 DIAGNOSIS — N186 End stage renal disease: Secondary | ICD-10-CM | POA: Diagnosis not present

## 2020-04-08 DIAGNOSIS — N2581 Secondary hyperparathyroidism of renal origin: Secondary | ICD-10-CM | POA: Diagnosis not present

## 2020-04-09 DIAGNOSIS — N2581 Secondary hyperparathyroidism of renal origin: Secondary | ICD-10-CM | POA: Diagnosis not present

## 2020-04-09 DIAGNOSIS — N186 End stage renal disease: Secondary | ICD-10-CM | POA: Diagnosis not present

## 2020-04-09 DIAGNOSIS — D509 Iron deficiency anemia, unspecified: Secondary | ICD-10-CM | POA: Diagnosis not present

## 2020-04-09 DIAGNOSIS — Z992 Dependence on renal dialysis: Secondary | ICD-10-CM | POA: Diagnosis not present

## 2020-04-10 DIAGNOSIS — D509 Iron deficiency anemia, unspecified: Secondary | ICD-10-CM | POA: Diagnosis not present

## 2020-04-10 DIAGNOSIS — Z992 Dependence on renal dialysis: Secondary | ICD-10-CM | POA: Diagnosis not present

## 2020-04-10 DIAGNOSIS — N186 End stage renal disease: Secondary | ICD-10-CM | POA: Diagnosis not present

## 2020-04-10 DIAGNOSIS — N2581 Secondary hyperparathyroidism of renal origin: Secondary | ICD-10-CM | POA: Diagnosis not present

## 2020-04-11 DIAGNOSIS — Z992 Dependence on renal dialysis: Secondary | ICD-10-CM | POA: Diagnosis not present

## 2020-04-11 DIAGNOSIS — N186 End stage renal disease: Secondary | ICD-10-CM | POA: Diagnosis not present

## 2020-04-11 DIAGNOSIS — D509 Iron deficiency anemia, unspecified: Secondary | ICD-10-CM | POA: Diagnosis not present

## 2020-04-11 DIAGNOSIS — N2581 Secondary hyperparathyroidism of renal origin: Secondary | ICD-10-CM | POA: Diagnosis not present

## 2020-04-12 DIAGNOSIS — D509 Iron deficiency anemia, unspecified: Secondary | ICD-10-CM | POA: Diagnosis not present

## 2020-04-12 DIAGNOSIS — Z992 Dependence on renal dialysis: Secondary | ICD-10-CM | POA: Diagnosis not present

## 2020-04-12 DIAGNOSIS — N186 End stage renal disease: Secondary | ICD-10-CM | POA: Diagnosis not present

## 2020-04-12 DIAGNOSIS — N2581 Secondary hyperparathyroidism of renal origin: Secondary | ICD-10-CM | POA: Diagnosis not present

## 2020-04-13 DIAGNOSIS — N2581 Secondary hyperparathyroidism of renal origin: Secondary | ICD-10-CM | POA: Diagnosis not present

## 2020-04-13 DIAGNOSIS — D509 Iron deficiency anemia, unspecified: Secondary | ICD-10-CM | POA: Diagnosis not present

## 2020-04-13 DIAGNOSIS — N186 End stage renal disease: Secondary | ICD-10-CM | POA: Diagnosis not present

## 2020-04-13 DIAGNOSIS — Z992 Dependence on renal dialysis: Secondary | ICD-10-CM | POA: Diagnosis not present

## 2020-04-14 DIAGNOSIS — N2581 Secondary hyperparathyroidism of renal origin: Secondary | ICD-10-CM | POA: Diagnosis not present

## 2020-04-14 DIAGNOSIS — M170 Bilateral primary osteoarthritis of knee: Secondary | ICD-10-CM | POA: Diagnosis not present

## 2020-04-14 DIAGNOSIS — D509 Iron deficiency anemia, unspecified: Secondary | ICD-10-CM | POA: Diagnosis not present

## 2020-04-14 DIAGNOSIS — Z992 Dependence on renal dialysis: Secondary | ICD-10-CM | POA: Diagnosis not present

## 2020-04-14 DIAGNOSIS — N186 End stage renal disease: Secondary | ICD-10-CM | POA: Diagnosis not present

## 2020-04-15 DIAGNOSIS — N2581 Secondary hyperparathyroidism of renal origin: Secondary | ICD-10-CM | POA: Diagnosis not present

## 2020-04-15 DIAGNOSIS — D509 Iron deficiency anemia, unspecified: Secondary | ICD-10-CM | POA: Diagnosis not present

## 2020-04-15 DIAGNOSIS — Z992 Dependence on renal dialysis: Secondary | ICD-10-CM | POA: Diagnosis not present

## 2020-04-15 DIAGNOSIS — N186 End stage renal disease: Secondary | ICD-10-CM | POA: Diagnosis not present

## 2020-04-16 DIAGNOSIS — Z992 Dependence on renal dialysis: Secondary | ICD-10-CM | POA: Diagnosis not present

## 2020-04-16 DIAGNOSIS — D509 Iron deficiency anemia, unspecified: Secondary | ICD-10-CM | POA: Diagnosis not present

## 2020-04-16 DIAGNOSIS — N2581 Secondary hyperparathyroidism of renal origin: Secondary | ICD-10-CM | POA: Diagnosis not present

## 2020-04-16 DIAGNOSIS — N186 End stage renal disease: Secondary | ICD-10-CM | POA: Diagnosis not present

## 2020-04-17 DIAGNOSIS — D509 Iron deficiency anemia, unspecified: Secondary | ICD-10-CM | POA: Diagnosis not present

## 2020-04-17 DIAGNOSIS — N2581 Secondary hyperparathyroidism of renal origin: Secondary | ICD-10-CM | POA: Diagnosis not present

## 2020-04-17 DIAGNOSIS — N186 End stage renal disease: Secondary | ICD-10-CM | POA: Diagnosis not present

## 2020-04-17 DIAGNOSIS — Z992 Dependence on renal dialysis: Secondary | ICD-10-CM | POA: Diagnosis not present

## 2020-04-18 DIAGNOSIS — D509 Iron deficiency anemia, unspecified: Secondary | ICD-10-CM | POA: Diagnosis not present

## 2020-04-18 DIAGNOSIS — N2581 Secondary hyperparathyroidism of renal origin: Secondary | ICD-10-CM | POA: Diagnosis not present

## 2020-04-18 DIAGNOSIS — N186 End stage renal disease: Secondary | ICD-10-CM | POA: Diagnosis not present

## 2020-04-18 DIAGNOSIS — Z992 Dependence on renal dialysis: Secondary | ICD-10-CM | POA: Diagnosis not present

## 2020-04-19 DIAGNOSIS — Z992 Dependence on renal dialysis: Secondary | ICD-10-CM | POA: Diagnosis not present

## 2020-04-19 DIAGNOSIS — N186 End stage renal disease: Secondary | ICD-10-CM | POA: Diagnosis not present

## 2020-04-19 DIAGNOSIS — N2581 Secondary hyperparathyroidism of renal origin: Secondary | ICD-10-CM | POA: Diagnosis not present

## 2020-04-19 DIAGNOSIS — D509 Iron deficiency anemia, unspecified: Secondary | ICD-10-CM | POA: Diagnosis not present

## 2020-04-20 DIAGNOSIS — D509 Iron deficiency anemia, unspecified: Secondary | ICD-10-CM | POA: Diagnosis not present

## 2020-04-20 DIAGNOSIS — Z992 Dependence on renal dialysis: Secondary | ICD-10-CM | POA: Diagnosis not present

## 2020-04-20 DIAGNOSIS — N186 End stage renal disease: Secondary | ICD-10-CM | POA: Diagnosis not present

## 2020-04-20 DIAGNOSIS — N2581 Secondary hyperparathyroidism of renal origin: Secondary | ICD-10-CM | POA: Diagnosis not present

## 2020-04-21 DIAGNOSIS — Z992 Dependence on renal dialysis: Secondary | ICD-10-CM | POA: Diagnosis not present

## 2020-04-21 DIAGNOSIS — N2581 Secondary hyperparathyroidism of renal origin: Secondary | ICD-10-CM | POA: Diagnosis not present

## 2020-04-21 DIAGNOSIS — D509 Iron deficiency anemia, unspecified: Secondary | ICD-10-CM | POA: Diagnosis not present

## 2020-04-21 DIAGNOSIS — N186 End stage renal disease: Secondary | ICD-10-CM | POA: Diagnosis not present

## 2020-04-22 ENCOUNTER — Other Ambulatory Visit: Payer: Self-pay

## 2020-04-22 ENCOUNTER — Inpatient Hospital Stay
Admission: EM | Admit: 2020-04-22 | Discharge: 2020-04-28 | DRG: 480 | Disposition: A | Discharge status: Home Health | Payer: Medicare Other | Attending: Internal Medicine | Admitting: Internal Medicine

## 2020-04-22 DIAGNOSIS — R112 Nausea with vomiting, unspecified: Secondary | ICD-10-CM | POA: Diagnosis not present

## 2020-04-22 DIAGNOSIS — Z880 Allergy status to penicillin: Secondary | ICD-10-CM | POA: Diagnosis not present

## 2020-04-22 DIAGNOSIS — D631 Anemia in chronic kidney disease: Secondary | ICD-10-CM | POA: Diagnosis present

## 2020-04-22 DIAGNOSIS — K219 Gastro-esophageal reflux disease without esophagitis: Secondary | ICD-10-CM | POA: Diagnosis present

## 2020-04-22 DIAGNOSIS — Z8249 Family history of ischemic heart disease and other diseases of the circulatory system: Secondary | ICD-10-CM | POA: Diagnosis not present

## 2020-04-22 DIAGNOSIS — Z79899 Other long term (current) drug therapy: Secondary | ICD-10-CM

## 2020-04-22 DIAGNOSIS — M1990 Unspecified osteoarthritis, unspecified site: Secondary | ICD-10-CM | POA: Diagnosis not present

## 2020-04-22 DIAGNOSIS — S72144A Nondisplaced intertrochanteric fracture of right femur, initial encounter for closed fracture: Secondary | ICD-10-CM | POA: Diagnosis not present

## 2020-04-22 DIAGNOSIS — S72009A Fracture of unspecified part of neck of unspecified femur, initial encounter for closed fracture: Secondary | ICD-10-CM | POA: Diagnosis not present

## 2020-04-22 DIAGNOSIS — D649 Anemia, unspecified: Secondary | ICD-10-CM | POA: Diagnosis not present

## 2020-04-22 DIAGNOSIS — I10 Essential (primary) hypertension: Secondary | ICD-10-CM | POA: Diagnosis not present

## 2020-04-22 DIAGNOSIS — I120 Hypertensive chronic kidney disease with stage 5 chronic kidney disease or end stage renal disease: Secondary | ICD-10-CM | POA: Diagnosis present

## 2020-04-22 DIAGNOSIS — R1111 Vomiting without nausea: Secondary | ICD-10-CM | POA: Diagnosis not present

## 2020-04-22 DIAGNOSIS — W1809XA Striking against other object with subsequent fall, initial encounter: Secondary | ICD-10-CM | POA: Diagnosis present

## 2020-04-22 DIAGNOSIS — D509 Iron deficiency anemia, unspecified: Secondary | ICD-10-CM | POA: Diagnosis not present

## 2020-04-22 DIAGNOSIS — T07XXXA Unspecified multiple injuries, initial encounter: Secondary | ICD-10-CM | POA: Diagnosis not present

## 2020-04-22 DIAGNOSIS — Z8781 Personal history of (healed) traumatic fracture: Secondary | ICD-10-CM

## 2020-04-22 DIAGNOSIS — S72001A Fracture of unspecified part of neck of right femur, initial encounter for closed fracture: Secondary | ICD-10-CM

## 2020-04-22 DIAGNOSIS — E871 Hypo-osmolality and hyponatremia: Secondary | ICD-10-CM | POA: Diagnosis not present

## 2020-04-22 DIAGNOSIS — S72141A Displaced intertrochanteric fracture of right femur, initial encounter for closed fracture: Secondary | ICD-10-CM | POA: Diagnosis present

## 2020-04-22 DIAGNOSIS — F32A Depression, unspecified: Secondary | ICD-10-CM | POA: Diagnosis not present

## 2020-04-22 DIAGNOSIS — Z882 Allergy status to sulfonamides status: Secondary | ICD-10-CM

## 2020-04-22 DIAGNOSIS — Z888 Allergy status to other drugs, medicaments and biological substances status: Secondary | ICD-10-CM | POA: Diagnosis not present

## 2020-04-22 DIAGNOSIS — Z9889 Other specified postprocedural states: Secondary | ICD-10-CM | POA: Diagnosis not present

## 2020-04-22 DIAGNOSIS — Z20822 Contact with and (suspected) exposure to covid-19: Secondary | ICD-10-CM | POA: Diagnosis present

## 2020-04-22 DIAGNOSIS — Z992 Dependence on renal dialysis: Secondary | ICD-10-CM

## 2020-04-22 DIAGNOSIS — Y92239 Unspecified place in hospital as the place of occurrence of the external cause: Secondary | ICD-10-CM | POA: Diagnosis not present

## 2020-04-22 DIAGNOSIS — Z881 Allergy status to other antibiotic agents status: Secondary | ICD-10-CM

## 2020-04-22 DIAGNOSIS — N186 End stage renal disease: Secondary | ICD-10-CM | POA: Diagnosis present

## 2020-04-22 DIAGNOSIS — N189 Chronic kidney disease, unspecified: Secondary | ICD-10-CM | POA: Diagnosis not present

## 2020-04-22 DIAGNOSIS — R509 Fever, unspecified: Secondary | ICD-10-CM | POA: Diagnosis not present

## 2020-04-22 DIAGNOSIS — T40605A Adverse effect of unspecified narcotics, initial encounter: Secondary | ICD-10-CM | POA: Diagnosis not present

## 2020-04-22 DIAGNOSIS — F419 Anxiety disorder, unspecified: Secondary | ICD-10-CM | POA: Diagnosis present

## 2020-04-22 DIAGNOSIS — N2581 Secondary hyperparathyroidism of renal origin: Secondary | ICD-10-CM | POA: Diagnosis not present

## 2020-04-22 DIAGNOSIS — R52 Pain, unspecified: Secondary | ICD-10-CM | POA: Diagnosis not present

## 2020-04-22 DIAGNOSIS — Z419 Encounter for procedure for purposes other than remedying health state, unspecified: Secondary | ICD-10-CM

## 2020-04-22 DIAGNOSIS — M170 Bilateral primary osteoarthritis of knee: Secondary | ICD-10-CM | POA: Diagnosis not present

## 2020-04-22 LAB — CBC WITH DIFFERENTIAL/PLATELET
Abs Immature Granulocytes: 0.05 10*3/uL (ref 0.00–0.07)
Basophils Absolute: 0 10*3/uL (ref 0.0–0.1)
Basophils Relative: 0 %
Eosinophils Absolute: 0.2 10*3/uL (ref 0.0–0.5)
Eosinophils Relative: 2 %
HCT: 33.7 % — ABNORMAL LOW (ref 36.0–46.0)
Hemoglobin: 11.9 g/dL — ABNORMAL LOW (ref 12.0–15.0)
Immature Granulocytes: 1 %
Lymphocytes Relative: 12 %
Lymphs Abs: 1.3 10*3/uL (ref 0.7–4.0)
MCH: 33.7 pg (ref 26.0–34.0)
MCHC: 35.3 g/dL (ref 30.0–36.0)
MCV: 95.5 fL (ref 80.0–100.0)
Monocytes Absolute: 0.7 10*3/uL (ref 0.1–1.0)
Monocytes Relative: 7 %
Neutro Abs: 8 10*3/uL — ABNORMAL HIGH (ref 1.7–7.7)
Neutrophils Relative %: 78 %
Platelets: 246 10*3/uL (ref 150–400)
RBC: 3.53 MIL/uL — ABNORMAL LOW (ref 3.87–5.11)
RDW: 15.6 % — ABNORMAL HIGH (ref 11.5–15.5)
WBC: 10.3 10*3/uL (ref 4.0–10.5)
nRBC: 0 % (ref 0.0–0.2)

## 2020-04-22 LAB — BASIC METABOLIC PANEL
Anion gap: 16 — ABNORMAL HIGH (ref 5–15)
BUN: 50 mg/dL — ABNORMAL HIGH (ref 8–23)
CO2: 20 mmol/L — ABNORMAL LOW (ref 22–32)
Calcium: 8.1 mg/dL — ABNORMAL LOW (ref 8.9–10.3)
Chloride: 89 mmol/L — ABNORMAL LOW (ref 98–111)
Creatinine, Ser: 5.49 mg/dL — ABNORMAL HIGH (ref 0.44–1.00)
GFR, Estimated: 8 mL/min — ABNORMAL LOW (ref >60)
Glucose, Bld: 99 mg/dL (ref 70–99)
Potassium: 3.6 mmol/L (ref 3.5–5.1)
Sodium: 125 mmol/L — ABNORMAL LOW (ref 135–145)

## 2020-04-22 LAB — RESP PANEL BY RT-PCR (FLU A&B, COVID) ARPGX2
Influenza A by PCR: NEGATIVE
Influenza B by PCR: NEGATIVE
SARS Coronavirus 2 by RT PCR: NEGATIVE

## 2020-04-22 MED ORDER — AMLODIPINE BESYLATE 10 MG PO TABS
10.0000 mg | ORAL_TABLET | Freq: Every day | ORAL | Status: DC
  Administered 2020-04-22 – 2020-04-28 (×6): 10 mg via ORAL
  Filled 2020-04-22 (×4): qty 1
  Filled 2020-04-22: qty 2
  Filled 2020-04-22: qty 1
  Filled 2020-04-22: qty 2

## 2020-04-22 MED ORDER — CLONIDINE HCL 0.1 MG PO TABS
0.2000 mg | ORAL_TABLET | Freq: Two times a day (BID) | ORAL | Status: DC
  Administered 2020-04-22 – 2020-04-28 (×11): 0.2 mg via ORAL
  Filled 2020-04-22 (×12): qty 2

## 2020-04-22 MED ORDER — OXYCODONE HCL 5 MG PO TABS
5.0000 mg | ORAL_TABLET | ORAL | Status: DC | PRN
  Administered 2020-04-22: 5 mg via ORAL
  Administered 2020-04-24 – 2020-04-25 (×9): 10 mg via ORAL
  Administered 2020-04-26: 5 mg via ORAL
  Filled 2020-04-22 (×3): qty 2
  Filled 2020-04-22: qty 1
  Filled 2020-04-22 (×4): qty 2
  Filled 2020-04-22: qty 1
  Filled 2020-04-22 (×2): qty 2

## 2020-04-22 MED ORDER — LOSARTAN POTASSIUM 50 MG PO TABS
100.0000 mg | ORAL_TABLET | Freq: Every day | ORAL | Status: DC
  Administered 2020-04-23 – 2020-04-27 (×6): 100 mg via ORAL
  Filled 2020-04-22 (×7): qty 2

## 2020-04-22 MED ORDER — BUPROPION HCL 75 MG PO TABS
75.0000 mg | ORAL_TABLET | Freq: Two times a day (BID) | ORAL | Status: DC
  Administered 2020-04-23 – 2020-04-28 (×11): 75 mg via ORAL
  Filled 2020-04-22 (×13): qty 1

## 2020-04-22 MED ORDER — CALCIUM ACETATE (PHOS BINDER) 667 MG PO CAPS
667.0000 mg | ORAL_CAPSULE | Freq: Three times a day (TID) | ORAL | Status: DC
  Administered 2020-04-24 – 2020-04-28 (×14): 667 mg via ORAL
  Filled 2020-04-22 (×18): qty 1

## 2020-04-22 MED ORDER — HYDRALAZINE HCL 50 MG PO TABS
50.0000 mg | ORAL_TABLET | Freq: Three times a day (TID) | ORAL | Status: DC | PRN
  Administered 2020-04-23: 20:00:00 50 mg via ORAL
  Filled 2020-04-22: qty 1

## 2020-04-22 MED ORDER — CARVEDILOL 25 MG PO TABS
25.0000 mg | ORAL_TABLET | Freq: Two times a day (BID) | ORAL | Status: DC
  Administered 2020-04-23 – 2020-04-28 (×10): 25 mg via ORAL
  Filled 2020-04-22 (×10): qty 1

## 2020-04-22 MED ORDER — HYDROMORPHONE HCL 1 MG/ML IJ SOLN
0.5000 mg | INTRAMUSCULAR | Status: DC | PRN
  Administered 2020-04-23: 0.5 mg via INTRAVENOUS
  Filled 2020-04-22: qty 1

## 2020-04-22 MED ORDER — VANCOMYCIN HCL IN DEXTROSE 1-5 GM/200ML-% IV SOLN
1000.0000 mg | Freq: Once | INTRAVENOUS | Status: AC
  Administered 2020-04-23: 16:00:00 1000 mg via INTRAVENOUS
  Filled 2020-04-22: qty 200

## 2020-04-22 MED ORDER — BUTALBITAL-APAP-CAFFEINE 50-325-40 MG PO TABS
1.0000 | ORAL_TABLET | Freq: Four times a day (QID) | ORAL | Status: DC | PRN
  Administered 2020-04-26 – 2020-04-28 (×5): 1 via ORAL
  Filled 2020-04-22 (×6): qty 1

## 2020-04-22 MED ORDER — ZOLPIDEM TARTRATE 5 MG PO TABS
5.0000 mg | ORAL_TABLET | Freq: Every day | ORAL | Status: DC
  Administered 2020-04-23 – 2020-04-27 (×6): 5 mg via ORAL
  Filled 2020-04-22 (×6): qty 1

## 2020-04-22 MED ORDER — PANTOPRAZOLE SODIUM 40 MG PO TBEC
40.0000 mg | DELAYED_RELEASE_TABLET | Freq: Every day | ORAL | Status: DC
  Administered 2020-04-23 – 2020-04-28 (×6): 40 mg via ORAL
  Filled 2020-04-22 (×6): qty 1

## 2020-04-22 MED ORDER — VANCOMYCIN HCL IN DEXTROSE 1-5 GM/200ML-% IV SOLN: 1000.0000 mg | Freq: Once | INTRAVENOUS | Status: DC

## 2020-04-22 MED ORDER — LORAZEPAM 0.5 MG PO TABS
0.5000 mg | ORAL_TABLET | Freq: Four times a day (QID) | ORAL | Status: DC | PRN
  Administered 2020-04-23 – 2020-04-27 (×4): 0.5 mg via ORAL
  Filled 2020-04-22 (×4): qty 1

## 2020-04-22 MED ORDER — BUTALBITAL-APAP-CAFFEINE 50-325-40 MG PO TABS
1.0000 | ORAL_TABLET | ORAL | Status: AC
  Administered 2020-04-22: 18:00:00 1 via ORAL
  Filled 2020-04-22: qty 1

## 2020-04-22 MED ORDER — MORPHINE SULFATE (PF) 4 MG/ML IV SOLN
4.0000 mg | Freq: Once | INTRAVENOUS | Status: AC
  Administered 2020-04-22: 18:00:00 4 mg via INTRAVENOUS
  Filled 2020-04-22: qty 1

## 2020-04-22 MED ORDER — HEPARIN SODIUM (PORCINE) 5000 UNIT/ML IJ SOLN: 5000.0000 [IU] | Freq: Two times a day (BID) | INTRAMUSCULAR | Status: DC

## 2020-04-22 MED ORDER — HYDROCODONE-ACETAMINOPHEN 5-325 MG PO TABS
1.0000 | ORAL_TABLET | Freq: Four times a day (QID) | ORAL | Status: DC | PRN
  Administered 2020-04-22: 1 via ORAL
  Administered 2020-04-23: 2 via ORAL
  Filled 2020-04-22: qty 1
  Filled 2020-04-22: qty 2

## 2020-04-22 MED ORDER — MORPHINE SULFATE (PF) 2 MG/ML IV SOLN
2.0000 mg | Freq: Once | INTRAVENOUS | Status: AC
  Administered 2020-04-22: 17:00:00 2 mg via INTRAVENOUS
  Filled 2020-04-22: qty 1

## 2020-04-22 NOTE — H&P (Signed)
History and Physical    Amy [name] AOZ:[id number] DOB: 1950-09-21 DOA: 04/22/2020  PCP: [name], MD (Confirm with patient/family/NH records and if not entered, this has to be entered at [facility] point of entry) Patient coming from: Home  I have personally briefly reviewed patient's old medical records in [place]  Chief Complaint: I tripped.  HPI: Amy [name] is a 69 y.o. female with medical history significant of ESRD on PD, refractory HTN, anxiety/depression, anemia secondary to chronic illness, GERD, presented with mechanical fall and right hip pain.  Patient tripped on a piece of carpet at the orthopedic surgeon's office today after injection of right knee with steroid for OA treatment.  SHe denied any lightheadedness before stabilized and no chest pains.  Feeling excruciating pain of the right hip after the fall.  No loss of consciousness.  X-ray at orthopedic surgery office showed minimally displaced right intertrochanteric hip fracture. ED Course: Blood pressure on the high side.  Blood work compatible with ESRD.  Review of Systems: As per HPI otherwise 14 point review of systems negative.    Past Medical History:  Diagnosis Date   Anemia    a. 08/2016 Guaiac + stool. EGD w/ gastritis.   Anxiety    CKD (chronic kidney disease), stage III (HCC)    Closed fracture of shaft of left ulna 11/27/2018   Colon polyp    a. 08/2016 Colonoscopy.   Gastritis    a. 08/2016 EGD: Gastritis-->PPI.   GERD (gastroesophageal reflux disease)    History of chicken pox    History of measles as a child    Hypertension    Hyponatremia    a. 08/2016 in setting of HCTZ Rx.   Mesenteric lymphadenopathy 04/08/2017   Multiple thyroid nodules     Past Surgical History:  Procedure Laterality Date   ABDOMINAL HYSTERECTOMY  2003   due to heavy periods and clotting during menses   BREAST LUMPECTOMY Left 1990   CHOLECYSTECTOMY  2010   COLONOSCOPY WITH PROPOFOL N/A  09/07/2016   Procedure: COLONOSCOPY WITH PROPOFOL;  Surgeon: Lucilla Lame, MD;  Location: [facility] ENDOSCOPY;  Service: Endoscopy;  Laterality: N/A;   DIALYSIS/PERMA CATHETER INSERTION N/A 11/16/2018   Procedure: DIALYSIS/PERMA CATHETER INSERTION;  Surgeon: Algernon Huxley, MD;  Location: [place] CV [facility];  Service: Cardiovascular;  Laterality: N/A;   DIALYSIS/PERMA CATHETER REMOVAL N/A 08/16/2019   Procedure: DIALYSIS/PERMA CATHETER REMOVAL;  Surgeon: Algernon Huxley, MD;  Location: [place] [facility];  Service: Cardiovascular;  Laterality: N/A;   DILATION AND CURETTAGE OF UTERUS  1985   ESOPHAGOGASTRODUODENOSCOPY (EGD) WITH PROPOFOL N/A 09/07/2016   Procedure: ESOPHAGOGASTRODUODENOSCOPY (EGD) WITH PROPOFOL;  Surgeon: Lucilla Lame, MD;  Location: [facility] ENDOSCOPY;  Service: Endoscopy;  Laterality: N/A;   GALLBLADDER SURGERY  2012   KNEE SURGERY     Dr. Tamala Julian   MRI, Lumbar spine  07/23/2009   Multilevel annular bulge and disc protrusion at L2-L3, L3-L4, L4-L5, and L5-S1   TONSILLECTOMY     TONSILLECTOMY AND ADENOIDECTOMY  1970   UPPER GI ENDOSCOPY  03/02/2006   H. Pylori negative; Dr. Allen Norris     reports that she has never smoked. She has never used smokeless tobacco. She reports current alcohol use. She reports that she does not use drugs.  Allergies  Allergen Reactions   Cephalosporins Hives and Swelling   Clarithromycin    Lunesta  [Eszopiclone]     Heart racing   Penicillin V     Has  patient had a PCN reaction causing immediate rash, facial/tongue/throat swelling, SOB or lightheadedness with hypotension: Yes Has patient had a PCN reaction causing severe rash involving mucus membranes or skin necrosis: No Has patient had a PCN reaction that required hospitalization No Has patient had a PCN reaction occurring within the last 10 years: No If all of the above answers are "NO", then may proceed with Cephalosporin use.   Sulfa Antibiotics     Family History  Problem Relation Age  of Onset   Pancreatic cancer Father    Congestive Heart Failure Maternal Grandmother    Cancer Paternal Grandfather    Heart disease Maternal Uncle      Prior to Admission medications   Medication Sig Start Date End Date Taking? Authorizing Provider  buPROPion (WELLBUTRIN) 75 MG tablet TAKE 1 TABLET(75 MG) BY MOUTH TWICE DAILY 02/11/20  Yes [name], MD  butalbital-acetaminophen-caffeine (FIORICET) 50-325-40 MG tablet TAKE 1 TO 2 TABLETS BY MOUTH EVERY 4 HOURS AS NEEDED FOR HEADACHE 03/12/20  Yes [name], MD  calcium acetate (PHOSLO) 667 MG capsule Take 1 capsule by mouth 3 (three) times daily with meals. 04/11/20  Yes [provider]  carvedilol (COREG) 25 MG tablet Take 1 tablet (25 mg total) by mouth 2 (two) times daily with a meal. 03/24/20  Yes Fisher, Kirstie Peri, MD  cloNIDine (CATAPRES) 0.2 MG tablet Take 1 tablet (0.2 mg total) by mouth 2 (two) times daily. 12/17/19 01/16/20 Yes Max Sane, MD  esomeprazole (NEXIUM) 20 MG capsule Take 20 mg by mouth daily. 02/20/19  Yes [provider]  hydrALAZINE (APRESOLINE) 50 MG tablet Take 1 tablet (50 mg total) by mouth 3 (three) times daily as needed. For SBP > 160 mm hg 12/17/19 01/16/20 Yes Max Sane, MD  LORazepam (ATIVAN) 1 MG tablet TAKE 1 TABLET(1 MG) BY MOUTH TWICE DAILY AS NEEDED 03/17/20  Yes [name], MD  losartan (COZAAR) 100 MG tablet Take 100 mg by mouth at bedtime. 02/20/19  Yes [provider]  valACYclovir (VALTREX) 1000 MG tablet Take 1,000 mg by mouth 2 (two) times daily as needed. 09/11/19  Yes [provider]  zolpidem (AMBIEN) 10 MG tablet TAKE 1/2 TO 1 TABLET(5 TO 10 MG) BY MOUTH AT BEDTIME AS NEEDED 01/15/20  Yes [name], MD  amLODipine (NORVASC) 10 MG tablet Take 10 mg by mouth daily. Patient not taking: Reported on 04/22/2020 02/27/19   [provider]  b complex-vitamin c-folic acid (NEPHRO-VITE) 0.8 MG TABS tablet Take 1 tablet by mouth  daily. Patient not taking: Reported on 04/22/2020    [provider]  Epoetin Alfa (EPOGEN IJ) Inject as directed.    [provider]    Physical Exam: Vitals:   04/22/20 1830 04/22/20 1930 04/22/20 2000 04/22/20 2020  BP: (!) 166/73 (!) 145/76 (!) 167/90   Pulse: 64  65   Resp: 14 18 18    Temp:    97.8 F (36.6 C)  TempSrc:    Oral  SpO2: 100%  100%   Weight:      Height:        Constitutional: NAD, calm, comfortable Vitals:   04/22/20 1830 04/22/20 1930 04/22/20 2000 04/22/20 2020  BP: (!) 166/73 (!) 145/76 (!) 167/90   Pulse: 64  65   Resp: 14 18 18    Temp:    97.8 F (36.6 C)  TempSrc:    Oral  SpO2: 100%  100%   Weight:  Height:       Eyes: PERRL, lids and conjunctivae normal ENMT: Mucous membranes are moist. Posterior pharynx clear of any exudate or lesions.Normal dentition.  Neck: normal, supple, no masses, no thyromegaly Respiratory: clear to auscultation bilaterally, no wheezing, no crackles. Normal respiratory effort. No accessory muscle use.  Cardiovascular: Regular rate and rhythm, no murmurs / rubs / gallops. No extremity edema. 2+ pedal pulses. No carotid bruits.  Abdomen: no tenderness, no masses palpated. No hepatosplenomegaly. Bowel sounds positive.  Musculoskeletal: no clubbing / cyanosis.  Right leg shortened and rotated Skin: no rashes, lesions, ulcers. No induration Neurologic: CN 2-12 grossly intact. Sensation intact, DTR normal. Strength 5/5 in all 4.  Psychiatric: Normal judgment and insight. Alert and oriented x 3. Normal mood.     Labs on Admission: I have personally reviewed following labs and imaging studies  CBC: Recent Labs  Lab 04/22/20 1644  WBC 10.3  NEUTROABS 8.0*  HGB 11.9*  HCT 33.7*  MCV 95.5  PLT 096   Basic Metabolic Panel: Recent Labs  Lab 04/22/20 1644  NA 125*  K 3.6  CL 89*  CO2 20*  GLUCOSE 99  BUN 50*  CREATININE 5.49*  CALCIUM 8.1*   GFR: Estimated Creatinine Clearance: 7.4  mL/min (A) (by C-G formula based on SCr of 5.49 mg/dL (H)). Liver Function Tests: No results for input(s): AST, ALT, ALKPHOS, BILITOT, PROT, ALBUMIN in the last 168 hours. No results for input(s): LIPASE, AMYLASE in the last 168 hours. No results for input(s): AMMONIA in the last 168 hours. Coagulation Profile: No results for input(s): INR, PROTIME in the last 168 hours. Cardiac Enzymes: No results for input(s): CKTOTAL, CKMB, CKMBINDEX, TROPONINI in the last 168 hours. BNP (last 3 results) No results for input(s): PROBNP in the last 8760 hours. HbA1C: No results for input(s): HGBA1C in the last 72 hours. CBG: No results for input(s): GLUCAP in the last 168 hours. Lipid Profile: No results for input(s): CHOL, HDL, LDLCALC, TRIG, CHOLHDL, LDLDIRECT in the last 72 hours. Thyroid Function Tests: No results for input(s): TSH, T4TOTAL, FREET4, T3FREE, THYROIDAB in the last 72 hours. Anemia Panel: No results for input(s): VITAMINB12, FOLATE, FERRITIN, TIBC, IRON, RETICCTPCT in the last 72 hours. Urine analysis:    Component Value Date/Time   COLORURINE STRAW (A) 12/16/2019 1742   APPEARANCEUR HAZY (A) 12/16/2019 1742   LABSPEC 1.010 12/16/2019 1742   PHURINE 5.0 12/16/2019 1742   GLUCOSEU NEGATIVE 12/16/2019 1742   HGBUR NEGATIVE 12/16/2019 1742   BILIRUBINUR NEGATIVE 12/16/2019 1742   BILIRUBINUR negative 08/31/2018 1132   KETONESUR NEGATIVE 12/16/2019 1742   PROTEINUR NEGATIVE 12/16/2019 1742   UROBILINOGEN 0.2 08/31/2018 1132   NITRITE NEGATIVE 12/16/2019 1742   LEUKOCYTESUR TRACE (A) 12/16/2019 1742    Radiological Exams on Admission: No results found.  EKG: Pending.  Assessment/Plan Active Problems:   Hip fracture (HCC)  (please populate well all problems here in Problem List. (For example, if patient is on BP meds at home and you resume or decide to hold them, it is a problem that needs to be her. Same for CAD, COPD, HLD and so on)  Right hip fracture secondary to  mechanical fall -OR tomorrow. -No Hx of MI or stroke, able to perform 4 METS equivalent activity at home, medically cleared for generalized anesthesia and hip surgery tomorrow with acceptable risk. -Pain control. -Check VitD level.  Uncontrolled hypertension -Part of problem appears to be pain control usual. -Resume home BP meds, continue as needed hydralazine.  ESRD on PD -Nephrology made aware by ED physician.   DVT prophylaxis: Heparin subcu  code Status: FullCode  Family Communication: None at bedside Disposition Plan: Expect more than two midnight hospital stay for hip surgery, PT evaluation, my need rehab. Consults called: Orthopedic surgery and nephrology Admission status: MedSurg admission   Lequita Halt MD [facility] Pager [phone]  04/22/2020, 8:42 PM

## 2020-04-22 NOTE — Consult Note (Signed)
ORTHOPAEDIC CONSULTATION  REQUESTING PHYSICIAN: Delman Kitten, MD  Chief Complaint: Right hip pain status post fall  HPI: Amy [name] is a 69 y.o. female who complains of right hip pain status post fall coming out of the orthopedic office today.  Patient was in the office for evaluation of right knee osteoarthritis.  Patient sustained a fall injuring her right hip.  X-rays taken at the office demonstrate a minimally displaced right intertrochanteric hip fracture.  Patient was sent to the ER for admission.  Patient will require intramedullary fixation for her right hip fracture.  She will be admitted to the hospital service for preop clearance.  Past Medical History:  Diagnosis Date  . Anemia    a. 08/2016 Guaiac + stool. EGD w/ gastritis.  . Anxiety   . CKD (chronic kidney disease), stage III ([place])   . Closed fracture of shaft of left ulna 11/27/2018  . Colon polyp    a. 08/2016 Colonoscopy.  . Gastritis    a. 08/2016 EGD: Gastritis-->PPI.  [name] GERD (gastroesophageal reflux disease)   . History of chicken pox   . History of measles as a child   . Hypertension   . Hyponatremia    a. 08/2016 in setting of HCTZ Rx.  . Mesenteric lymphadenopathy 04/08/2017  . Multiple thyroid nodules    Past Surgical History:  Procedure Laterality Date  . ABDOMINAL HYSTERECTOMY  2003   due to heavy periods and clotting during menses  . BREAST LUMPECTOMY Left 1990  . CHOLECYSTECTOMY  2010  . COLONOSCOPY WITH PROPOFOL N/A 09/07/2016   Procedure: COLONOSCOPY WITH PROPOFOL;  Surgeon: Lucilla Lame, MD;  Location: [facility] ENDOSCOPY;  Service: Endoscopy;  Laterality: N/A;  . DIALYSIS/PERMA CATHETER INSERTION N/A 11/16/2018   Procedure: DIALYSIS/PERMA CATHETER INSERTION;  Surgeon: Algernon Huxley, MD;  Location: [place] CV LAB;  Service: Cardiovascular;  Laterality: N/A;  . DIALYSIS/PERMA CATHETER REMOVAL N/A 08/16/2019   Procedure: DIALYSIS/PERMA CATHETER REMOVAL;  Surgeon: Algernon Huxley, MD;  Location: [place] [facility] LAB;  Service: Cardiovascular;  Laterality: N/A;  . DILATION AND CURETTAGE OF UTERUS  1985  . ESOPHAGOGASTRODUODENOSCOPY (EGD) WITH PROPOFOL N/A 09/07/2016   Procedure: ESOPHAGOGASTRODUODENOSCOPY (EGD) WITH PROPOFOL;  Surgeon: Lucilla Lame, MD;  Location[facility] ENDOSCOPY;  Service: Endoscopy;  Laterality: N/A;  . GALLBLADDER SURGERY  2012  . KNEE SURGERY     Dr. Tamala Julian  . MRI, Lumbar spine  07/23/2009   Multilevel annular bulge and disc protrusion at L2-L3, L3-L4, L4-L5, and L5-S1  . TONSILLECTOMY    . TONSILLECTOMY AND ADENOIDECTOMY  1970  . UPPER GI ENDOSCOPY  03/02/2006   H. Pylori negative; Dr. Allen Norris   Social History   Socioeconomic History  . Marital status: Married    Spouse name: Not on file  . Number of children: 1  . Years of education: Not on file  . Highest education level: Bachelor's degree (e.g., BA, AB, BS)  Occupational History  . Occupation: Retired  Tobacco Use  . Smoking status: Never Smoker  . Smokeless tobacco: Never Used  Vaping Use  . Vaping Use: Never used  Substance and Sexual Activity  . Alcohol use: Yes    Alcohol/week: 0.0 - 2.0 standard drinks  . Drug use: No  . Sexual activity: Yes  Other Topics Concern  . Not on file  Social History Narrative   Lives in [place] with husband.  Does not routinely exercise.   Social Determinants of Health   Financial Resource Strain: Low Risk   .  Difficulty of Paying Living Expenses: Not hard at all  Food Insecurity: No Food Insecurity  . Worried About Charity fundraiser in the Last Year: Never true  . Ran Out of Food in the Last Year: Never true  Transportation Needs: No Transportation Needs  . Lack of Transportation (Medical): No  . Lack of Transportation (Non-Medical): No  Physical Activity: Inactive  . Days of Exercise per Week: 0 days  . Minutes of Exercise per Session: 0 min  Stress: No Stress Concern Present  . Feeling of Stress : Not at all  Social Connections: Moderately Integrated   . Frequency of Communication with Friends and Family: More than three times a week  . Frequency of Social Gatherings with Friends and Family: Three times a week  . Attends Religious Services: More than 4 times per year  . Active Member of Clubs or Organizations: No  . Attends Archivist Meetings: Never  . Marital Status: Married   Family History  Problem Relation Age of Onset  . Pancreatic cancer Father   . Congestive Heart Failure Maternal Grandmother   . Cancer Paternal Grandfather   . Heart disease Maternal Uncle    Allergies  Allergen Reactions  . Cephalosporins   . Clarithromycin   . Lunesta  [Eszopiclone]     Heart racing  . Penicillin V     Has patient had a PCN reaction causing immediate rash, facial/tongue/throat swelling, SOB or lightheadedness with hypotension: Yes Has patient had a PCN reaction causing severe rash involving mucus membranes or skin necrosis: No Has patient had a PCN reaction that required hospitalization No Has patient had a PCN reaction occurring within the last 10 years: No If all of the above answers are "NO", then may proceed with Cephalosporin use.  . Sulfa Antibiotics    Prior to Admission medications   Medication Sig Start Date End Date Taking? Authorizing Provider  amLODipine (NORVASC) 10 MG tablet Take 10 mg by mouth daily. 02/27/19   [provider]  b complex-vitamin c-folic acid (NEPHRO-VITE) 0.8 MG TABS tablet Take 1 tablet by mouth daily.    [provider]  buPROPion (WELLBUTRIN) 75 MG tablet TAKE 1 TABLET(75 MG) BY MOUTH TWICE DAILY 02/11/20   [name], MD  butalbital-acetaminophen-caffeine (FIORICET) [phone] MG tablet TAKE 1 TO 2 TABLETS BY MOUTH EVERY 4 HOURS AS NEEDED FOR HEADACHE 03/12/20   [name], MD  carvedilol (COREG) 25 MG tablet Take 1 tablet (25 mg total) by mouth 2 (two) times daily with a meal. 03/24/20   [name], MD  cloNIDine (CATAPRES) 0.2 MG tablet Take 1 tablet  (0.2 mg total) by mouth 2 (two) times daily. 12/17/19 01/16/20  Max Sane, MD  Epoetin Alfa (EPOGEN IJ) Inject as directed.    [provider]  esomeprazole (NEXIUM) 20 MG capsule Take 20 mg by mouth daily. 02/20/19   [provider]  hydrALAZINE (APRESOLINE) 50 MG tablet Take 1 tablet (50 mg total) by mouth 3 (three) times daily as needed. For SBP > 160 mm hg 12/17/19 01/16/20  Max Sane, MD  LORazepam (ATIVAN) 1 MG tablet TAKE 1 TABLET(1 MG) BY MOUTH TWICE DAILY AS NEEDED 03/17/20   [name], MD  losartan (COZAAR) 100 MG tablet Take 100 mg by mouth at bedtime. 02/20/19   [provider]  valACYclovir (VALTREX) 1000 MG tablet Take 1,000 mg by mouth 2 (two) times daily as needed. 09/11/19   [provider]  zolpidem (  AMBIEN) 10 MG tablet TAKE 1/2 TO 1 TABLET(5 TO 10 MG) BY MOUTH AT BEDTIME AS NEEDED 01/15/20   [name], MD   No results found.  Positive ROS: All other systems have been reviewed and were otherwise negative with the exception of those mentioned in the HPI and as above.  Physical Exam: General: Alert, no acute distress  MUSCULOSKELETAL: Right lower extremity: Patient does not have significant external rotation or shortening.  She has palpable pedal pulses, intact/light touch and intact motor function distally.  Assessment: Right intertrochanteric hip fracture  Plan: I reviewed the details of the operation as well as the postoperative course with the patient and her daughter and husband who are with her in the ER.  I explained to them the postoperative course as well.  Patient will be in the hospital for at least a couple days.  I discussed the risks and benefits of surgery. The risks include but are not limited to infection, bleeding requiring blood transfusion, nerve or blood vessel injury, joint stiffness or loss of motion, persistent pain, weakness or instability, malunion, nonunion and hardware failure and the need for further  surgery. Medical risks include but are not limited to DVT and pulmonary embolism, myocardial infarction, stroke, pneumonia, respiratory failure and death. Patient understood these risks and wished to proceed.    Thornton Park, MD    04/22/2020 5:35 PM

## 2020-04-22 NOTE — ED Triage Notes (Signed)
Pt arrived to ED via [facility] from [facility]. Per EMS, pt was seen there and slipped and fell in the waiting room. Per EMS, [facility] confirmed R hip fx with X-ray at their facility.   Per EMS, VS 184/80, HR 65, 99% Ra, RR 18.   Per EMS, pt was given 75 mcg fentanyl en route.   Per Pt, she was given an injection in her R knee prior to the fall.

## 2020-04-22 NOTE — ED Provider Notes (Signed)
[facility] Emergency Department Provider Note ____________________________________________   Event Date/Time   First MD Initiated Contact with Patient 04/22/20 1547     (approximate)  I have reviewed the triage vital signs and the nursing notes.  HISTORY  Chief Complaint Hip Injury  HPI Amy [name] is a 68 y.o. female history of end-stage renal disease peritoneal dialysis arthritis  Patient orthopedic clinic today had shot in her right knee for arthritis.  She reports after the visit she tripped on a carpet fell directly onto her right hip with immediate pain.  Reports the pain was severe but is relieved modestly by fentanyl she was given.  No other injury.  Denies other injury did not strike her head.  No back or neck pain.  She reports that she is comfortable relatively well seated still with her foot on pillow.  No nausea vomiting no recent illness.  Fully vaccinated against Covid  Denies chest pain or shortness of breath.  No numbness or weakness in the right leg except any movement is extremely painful.    Past Medical History:  Diagnosis Date  . Anemia    a. 08/2016 Guaiac + stool. EGD w/ gastritis.  . Anxiety   . CKD (chronic kidney disease), stage III ([place])   . Closed fracture of shaft of left ulna 11/27/2018  . Colon polyp    a. 08/2016 Colonoscopy.  . Gastritis    a. 08/2016 EGD: Gastritis-->PPI.  [name] GERD (gastroesophageal reflux disease)   . History of chicken pox   . History of measles as a child   . Hypertension   . Hyponatremia    a. 08/2016 in setting of HCTZ Rx.  . Mesenteric lymphadenopathy 04/08/2017  . Multiple thyroid nodules     Patient Active Problem List   Diagnosis Date Noted  . Hypotension 12/16/2019  . ESRD on dialysis ([place]) 12/16/2019  . Depression with anxiety 12/16/2019  . Hypomagnesemia 12/16/2019  . Atrial flutter ([place])   . Atrial fibrillation with RVR ([place]) 04/03/2019  . Benign essential HTN 04/03/2019  . HLD  (hyperlipidemia) 04/03/2019  . End stage renal disease ([place]) 12/13/2018  . Itching 11/08/2018  . Hyperparathyroidism, secondary renal ([place]) 09/13/2017  . Ground glass opacity present on imaging of lung 04/08/2017  . Osteoarthritis of knee 12/29/2016  . Normocytic anemia 10/14/2016  . Intractable episodic headache   . Gastritis   . History of adenomatous polyp of colon 09/07/2016  . Iron deficiency anemia 09/05/2016  . Multiple thyroid nodules 07/04/2015  . Right thyroid nodule 06/27/2015  . Allergic rhinitis, seasonal 12/03/2014  . Fever blister 12/03/2014  . Herniated lumbar intervertebral disc 12/03/2014  . Hypokalemia 12/03/2014  . Recurrent sinus infections 12/03/2014  . Insomnia 11/05/2014  . Acquired scoliosis 06/26/2014  . Intervertebral disc stenosis of neural canal of lumbar region 06/20/2014  . Lumbar facet arthropathy 05/21/2014  . Degenerative spondylolisthesis 03/12/2014  . OP (osteoporosis) 08/10/2013  . Idiopathic peripheral neuropathy 03/24/2009  . Edema 12/27/2007  . Headache, tension-type 10/25/2006  . Hypertension 10/24/2006  . Anxiety 05/03/1998  . Esophageal reflux 05/03/1998    Past Surgical History:  Procedure Laterality Date  . ABDOMINAL HYSTERECTOMY  2003   due to heavy periods and clotting during menses  . BREAST LUMPECTOMY Left 1990  . CHOLECYSTECTOMY  2010  . COLONOSCOPY WITH PROPOFOL N/A 09/07/2016   Procedure: COLONOSCOPY WITH PROPOFOL;  Surgeon: Lucilla Lame, MD;  Location: [facility] ENDOSCOPY;  Service: Endoscopy;  Laterality: N/A;  . DIALYSIS/PERMA  CATHETER INSERTION N/A 11/16/2018   Procedure: DIALYSIS/PERMA CATHETER INSERTION;  Surgeon: Algernon Huxley, MD;  Location: [place] CV LAB;  Service: Cardiovascular;  Laterality: N/A;  . DIALYSIS/PERMA CATHETER REMOVAL N/A 08/16/2019   Procedure: DIALYSIS/PERMA CATHETER REMOVAL;  Surgeon: Algernon Huxley, MD;  Location: [place] CV LAB;  Service: Cardiovascular;  Laterality: N/A;  . DILATION AND  CURETTAGE OF UTERUS  1985  . ESOPHAGOGASTRODUODENOSCOPY (EGD) WITH PROPOFOL N/A 09/07/2016   Procedure: ESOPHAGOGASTRODUODENOSCOPY (EGD) WITH PROPOFOL;  Surgeon: Lucilla Lame, MD;  Location[facility] ENDOSCOPY;  Service: Endoscopy;  Laterality: N/A;  . GALLBLADDER SURGERY  2012  . KNEE SURGERY     Dr. Tamala Julian  . MRI, Lumbar spine  07/23/2009   Multilevel annular bulge and disc protrusion at L2-L3, L3-L4, L4-L5, and L5-S1  . TONSILLECTOMY    . TONSILLECTOMY AND ADENOIDECTOMY  1970  . UPPER GI ENDOSCOPY  03/02/2006   H. Pylori negative; Dr. Allen Norris    Prior to Admission medications   Medication Sig Start Date End Date Taking? Authorizing Provider  amLODipine (NORVASC) 10 MG tablet Take 10 mg by mouth daily. 02/27/19   [provider]  b complex-vitamin c-folic acid (NEPHRO-VITE) 0.8 MG TABS tablet Take 1 tablet by mouth daily.    [provider]  buPROPion (WELLBUTRIN) 75 MG tablet TAKE 1 TABLET(75 MG) BY MOUTH TWICE DAILY 02/11/20   [name], MD  butalbital-acetaminophen-caffeine (FIORICET) [phone] MG tablet TAKE 1 TO 2 TABLETS BY MOUTH EVERY 4 HOURS AS NEEDED FOR HEADACHE 03/12/20   [name], MD  carvedilol (COREG) 25 MG tablet Take 1 tablet (25 mg total) by mouth 2 (two) times daily with a meal. 03/24/20   [name], MD  cloNIDine (CATAPRES) 0.2 MG tablet Take 1 tablet (0.2 mg total) by mouth 2 (two) times daily. 12/17/19 01/16/20  Max Sane, MD  Epoetin Alfa (EPOGEN IJ) Inject as directed.    [provider]  esomeprazole (NEXIUM) 20 MG capsule Take 20 mg by mouth daily. 02/20/19   [provider]  hydrALAZINE (APRESOLINE) 50 MG tablet Take 1 tablet (50 mg total) by mouth 3 (three) times daily as needed. For SBP > 160 mm hg 12/17/19 01/16/20  Max Sane, MD  LORazepam (ATIVAN) 1 MG tablet TAKE 1 TABLET(1 MG) BY MOUTH TWICE DAILY AS NEEDED 03/17/20   [name], MD  losartan (COZAAR) 100 MG tablet Take 100 mg by mouth at bedtime.  02/20/19   [provider]  valACYclovir (VALTREX) 1000 MG tablet Take 1,000 mg by mouth 2 (two) times daily as needed. 09/11/19   [provider]  zolpidem (AMBIEN) 10 MG tablet TAKE 1/2 TO 1 TABLET(5 TO 10 MG) BY MOUTH AT BEDTIME AS NEEDED 01/15/20   [name], MD    Allergies Cephalosporins, Clarithromycin, Lunesta  [eszopiclone], Penicillin v, and Sulfa antibiotics  Family History  Problem Relation Age of Onset  . Pancreatic cancer Father   . Congestive Heart Failure Maternal Grandmother   . Cancer Paternal Grandfather   . Heart disease Maternal Uncle     Social History Social History   Tobacco Use  . Smoking status: Never Smoker  . Smokeless tobacco: Never Used  Vaping Use  . Vaping Use: Never used  Substance Use Topics  . Alcohol use: Yes    Alcohol/week: 0.0 - 2.0 standard drinks  . Drug use: No    Review of Systems Constitutional: No fever/chills Eyes: No visual changes. ENT: No sore throat.  No neck pain Cardiovascular: Denies chest pain. Respiratory: Denies shortness of breath. Gastrointestinal: No abdominal pain.   Genitourinary: Negative for dysuria. Musculoskeletal: Negative for back pain.  See HPI Skin: Negative for rash. Neurological: Negative for headaches, areas of focal weakness or numbness.    ____________________________________________   PHYSICAL EXAM:  VITAL SIGNS: ED Triage Vitals  Enc Vitals Group     BP 04/22/20 1543 (!) 182/94     Pulse Rate 04/22/20 1543 62     Resp 04/22/20 1543 18     Temp 04/22/20 1543 97.6 F (36.4 C)     Temp Source 04/22/20 1543 Oral     SpO2 04/22/20 1543 100 %     Weight 04/22/20 1541 110 lb (49.9 kg)     Height 04/22/20 1541 5\' 4"  (1.626 m)     Head Circumference --      Peak Flow --      Pain Score --      Pain Loc --      Pain Edu? --      Excl. in [place]? --     Constitutional: Alert and oriented. Well appearing and in no acute distress. Eyes: Conjunctivae are  normal. Head: Atraumatic. Nose: No congestion/rhinnorhea. Mouth/Throat: Mucous membranes are moist. Neck: No stridor.  Cardiovascular: Normal rate, regular rhythm. Grossly normal heart sounds.  Good peripheral circulation. Respiratory: Normal respiratory effort.  No retractions. Lungs CTAB. Gastrointestinal: Soft and nontender. No distention. Musculoskeletal: No lower extremity tenderness nor edema.  Right leg slightly shortened.  Palpable dorsalis pedis pulse.  Toes appear well perfused.  Wiggles them well and has normal sensation across all toes.  No other denoted injuries to the right lower extremity.  Left lower extremity and remaining extremities atraumatic Neurologic:  Normal speech and language. No gross focal neurologic deficits are appreciated.  Skin:  Skin is warm, dry and intact. No rash noted. Psychiatric: Mood and affect are normal. Speech and behavior are normal.  ____________________________________________   LABS (all labs ordered are listed, but only abnormal results are displayed)  Labs Reviewed  CBC WITH DIFFERENTIAL/PLATELET - Abnormal; Notable for the following components:      Result Value   RBC 3.53 (*)    Hemoglobin 11.9 (*)    HCT 33.7 (*)    RDW 15.6 (*)    Neutro Abs 8.0 (*)    All other components within normal limits  BASIC METABOLIC PANEL - Abnormal; Notable for the following components:   Sodium 125 (*)    Chloride 89 (*)    CO2 20 (*)    BUN 50 (*)    Creatinine, Ser 5.49 (*)    Calcium 8.1 (*)    GFR, Estimated 8 (*)    Anion gap 16 (*)    All other components within normal limits  RESP PANEL BY RT-PCR (FLU A&B, COVID) ARPGX2  SAMPLE TO BLOOD BANK  SAMPLE TO BLOOD BANK   ___ ____________________________________________  RADIOLOGY  I discussed with Dr. Mack Guise, he advises he has imaging from the clinic where she felt and is able to discern her hip fracture and does not require additional  imaging ____________________________________________   PROCEDURES  Procedure(s) performed: None  Procedures  Critical Care performed: No  ____________________________________________   INITIAL IMPRESSION / ASSESSMENT AND PLAN / ED COURSE  Pertinent labs & imaging results that were available during my care of the patient were reviewed by me and considered in my medical decision making (see chart for details).   Fall.  Focal pain right hip.  Shortening and imaging per Dr. Mack Guise consistent with right hip fracture.  Patient to be admitted kept n.p.o. with anticipated surgery tomorrow    ----------------------------------------- 6:34 PM on 04/22/2020 -----------------------------------------  Discussed with Dr. Candiss Norse of nephrology.  Will consult.  Admission discussed with Dr. Roosevelt Locks.  Dr. Mack Guise has seen and evaluated the patient in the ER and planning for OR tomorrow  Patient and family agreeable with admission  ____________________________________________   FINAL CLINICAL IMPRESSION(S) / ED DIAGNOSES  Final diagnoses:  Closed right hip fracture, initial encounter [facility])        Note:  This document was prepared using Dragon voice recognition software and may include unintentional dictation errors       Delman Kitten, MD 04/22/20 1835

## 2020-04-22 NOTE — ED Notes (Signed)
Pt changed into clean gown, clean purewick placed. Pt given belonging cup to put jewelery in at this time.  Pt belongings placed in belonging bag at bedside.

## 2020-04-23 ENCOUNTER — Inpatient Hospital Stay: Payer: Medicare Other

## 2020-04-23 ENCOUNTER — Encounter
Admission: EM | Disposition: A | Discharge status: Home Health | Payer: Self-pay | Source: Home / Self Care | Attending: Internal Medicine

## 2020-04-23 ENCOUNTER — Inpatient Hospital Stay: Payer: Medicare Other | Admitting: Anesthesiology

## 2020-04-23 ENCOUNTER — Encounter: Payer: Self-pay | Admitting: Internal Medicine

## 2020-04-23 DIAGNOSIS — Z992 Dependence on renal dialysis: Secondary | ICD-10-CM | POA: Diagnosis not present

## 2020-04-23 DIAGNOSIS — D509 Iron deficiency anemia, unspecified: Secondary | ICD-10-CM | POA: Diagnosis not present

## 2020-04-23 DIAGNOSIS — I10 Essential (primary) hypertension: Secondary | ICD-10-CM

## 2020-04-23 DIAGNOSIS — E871 Hypo-osmolality and hyponatremia: Secondary | ICD-10-CM

## 2020-04-23 DIAGNOSIS — N186 End stage renal disease: Secondary | ICD-10-CM

## 2020-04-23 DIAGNOSIS — N2581 Secondary hyperparathyroidism of renal origin: Secondary | ICD-10-CM | POA: Diagnosis not present

## 2020-04-23 LAB — BASIC METABOLIC PANEL
Anion gap: 12 (ref 5–15)
BUN: 56 mg/dL — ABNORMAL HIGH (ref 8–23)
CO2: 23 mmol/L (ref 22–32)
Calcium: 8.3 mg/dL — ABNORMAL LOW (ref 8.9–10.3)
Chloride: 90 mmol/L — ABNORMAL LOW (ref 98–111)
Creatinine, Ser: 5.72 mg/dL — ABNORMAL HIGH (ref 0.44–1.00)
GFR, Estimated: 7 mL/min — ABNORMAL LOW (ref >60)
Glucose, Bld: 111 mg/dL — ABNORMAL HIGH (ref 70–99)
Potassium: 4.4 mmol/L (ref 3.5–5.1)
Sodium: 125 mmol/L — ABNORMAL LOW (ref 135–145)

## 2020-04-23 LAB — CBC
HCT: 35.5 % — ABNORMAL LOW (ref 36.0–46.0)
Hemoglobin: 12.5 g/dL (ref 12.0–15.0)
MCH: 34 pg (ref 26.0–34.0)
MCHC: 35.2 g/dL (ref 30.0–36.0)
MCV: 96.5 fL (ref 80.0–100.0)
Platelets: 217 10*3/uL (ref 150–400)
RBC: 3.68 MIL/uL — ABNORMAL LOW (ref 3.87–5.11)
RDW: 15.5 % (ref 11.5–15.5)
WBC: 11 10*3/uL — ABNORMAL HIGH (ref 4.0–10.5)
nRBC: 0 % (ref 0.0–0.2)

## 2020-04-23 LAB — TYPE AND SCREEN
ABO/RH(D): O POS
Antibody Screen: NEGATIVE

## 2020-04-23 LAB — VITAMIN D 25 HYDROXY (VIT D DEFICIENCY, FRACTURES): Vit D, 25-Hydroxy: 14.57 ng/mL — ABNORMAL LOW (ref 30–100)

## 2020-04-23 SURGERY — FIXATION, FRACTURE, INTERTROCHANTERIC, WITH INTRAMEDULLARY ROD
Anesthesia: General | Site: Hip | Laterality: Right

## 2020-04-23 MED ORDER — NEOMYCIN-POLYMYXIN B GU 40-200000 IR SOLN
Status: DC | PRN
  Administered 2020-04-23: 4 mL

## 2020-04-23 MED ORDER — SODIUM CHLORIDE 0.9 % IV SOLN
75.0000 mL/h | INTRAVENOUS | Status: DC
  Administered 2020-04-23: 20:00:00 75 mL/h via INTRAVENOUS

## 2020-04-23 MED ORDER — GENTAMICIN SULFATE 40 MG/ML IJ SOLN
INTRAMUSCULAR | Status: AC
  Filled 2020-04-23: qty 2

## 2020-04-23 MED ORDER — FENTANYL CITRATE (PF) 100 MCG/2ML IJ SOLN
INTRAMUSCULAR | Status: AC
  Filled 2020-04-23: qty 2

## 2020-04-23 MED ORDER — ROCURONIUM BROMIDE 100 MG/10ML IV SOLN
INTRAVENOUS | Status: DC | PRN
  Administered 2020-04-23: 40 mg via INTRAVENOUS
  Administered 2020-04-23: 30 mg via INTRAVENOUS
  Administered 2020-04-23: 40 mg via INTRAVENOUS

## 2020-04-23 MED ORDER — GLYCOPYRROLATE 0.2 MG/ML IJ SOLN
INTRAMUSCULAR | Status: DC | PRN
  Administered 2020-04-23: .2 mg via INTRAVENOUS
  Administered 2020-04-23: .4 mg via INTRAVENOUS

## 2020-04-23 MED ORDER — ACETAMINOPHEN 325 MG PO TABS
325.0000 mg | ORAL_TABLET | Freq: Four times a day (QID) | ORAL | Status: DC | PRN
  Administered 2020-04-24 – 2020-04-25 (×3): 650 mg via ORAL
  Filled 2020-04-23 (×3): qty 2

## 2020-04-23 MED ORDER — DEXAMETHASONE SODIUM PHOSPHATE 10 MG/ML IJ SOLN
INTRAMUSCULAR | Status: AC
  Filled 2020-04-23: qty 1

## 2020-04-23 MED ORDER — PHENOL 1.4 % MT LIQD
1.0000 | OROMUCOSAL | Status: DC | PRN
  Filled 2020-04-23: qty 177

## 2020-04-23 MED ORDER — ONDANSETRON HCL 4 MG PO TABS
4.0000 mg | ORAL_TABLET | Freq: Four times a day (QID) | ORAL | Status: DC | PRN
  Administered 2020-04-26: 18:00:00 4 mg via ORAL
  Filled 2020-04-23: qty 1

## 2020-04-23 MED ORDER — OXYCODONE HCL 5 MG/5ML PO SOLN: 5.0000 mg | Freq: Once | ORAL | Status: DC | PRN

## 2020-04-23 MED ORDER — PROPOFOL 10 MG/ML IV BOLUS
INTRAVENOUS | Status: DC | PRN
  Administered 2020-04-23: 70 mg via INTRAVENOUS

## 2020-04-23 MED ORDER — FENTANYL CITRATE (PF) 100 MCG/2ML IJ SOLN
25.0000 ug | INTRAMUSCULAR | Status: DC | PRN
  Administered 2020-04-23: 50 ug via INTRAVENOUS
  Administered 2020-04-23: 25 ug via INTRAVENOUS
  Administered 2020-04-23: 50 ug via INTRAVENOUS
  Administered 2020-04-23: 25 ug via INTRAVENOUS
  Administered 2020-04-23: 50 ug via INTRAVENOUS

## 2020-04-23 MED ORDER — ACETAMINOPHEN 500 MG PO TABS
500.0000 mg | ORAL_TABLET | Freq: Four times a day (QID) | ORAL | Status: AC
  Administered 2020-04-23 – 2020-04-24 (×4): 500 mg via ORAL
  Filled 2020-04-23 (×4): qty 1

## 2020-04-23 MED ORDER — GLYCOPYRROLATE 0.2 MG/ML IJ SOLN
INTRAMUSCULAR | Status: AC
  Filled 2020-04-23: qty 2

## 2020-04-23 MED ORDER — BUPIVACAINE HCL (PF) 0.25 % IJ SOLN
INTRAMUSCULAR | Status: AC
  Filled 2020-04-23: qty 30

## 2020-04-23 MED ORDER — DEXAMETHASONE SODIUM PHOSPHATE 10 MG/ML IJ SOLN
INTRAMUSCULAR | Status: DC | PRN
  Administered 2020-04-23: 10 mg via INTRAVENOUS

## 2020-04-23 MED ORDER — RENA-VITE PO TABS
1.0000 | ORAL_TABLET | Freq: Every day | ORAL | Status: DC
  Administered 2020-04-24 – 2020-04-28 (×5): 1 via ORAL
  Filled 2020-04-23 (×6): qty 1

## 2020-04-23 MED ORDER — ACETAMINOPHEN 10 MG/ML IV SOLN
INTRAVENOUS | Status: DC | PRN
  Administered 2020-04-23: 750 mg via INTRAVENOUS

## 2020-04-23 MED ORDER — FENTANYL CITRATE (PF) 100 MCG/2ML IJ SOLN
INTRAMUSCULAR | Status: DC | PRN
  Administered 2020-04-23 (×2): 50 ug via INTRAVENOUS

## 2020-04-23 MED ORDER — LIDOCAINE HCL (CARDIAC) PF 100 MG/5ML IV SOSY
PREFILLED_SYRINGE | INTRAVENOUS | Status: DC | PRN
  Administered 2020-04-23: 60 mg via INTRAVENOUS

## 2020-04-23 MED ORDER — TRAMADOL HCL 50 MG PO TABS
50.0000 mg | ORAL_TABLET | Freq: Four times a day (QID) | ORAL | Status: DC
Start: 2020-04-23 — End: 2020-04-28
  Administered 2020-04-23 – 2020-04-28 (×19): 50 mg via ORAL
  Filled 2020-04-23 (×19): qty 1

## 2020-04-23 MED ORDER — ALUM & MAG HYDROXIDE-SIMETH 200-200-20 MG/5ML PO SUSP: 30.0000 mL | ORAL | Status: DC | PRN

## 2020-04-23 MED ORDER — MAGNESIUM CITRATE PO SOLN
1.0000 | Freq: Once | ORAL | Status: DC | PRN
  Filled 2020-04-23: qty 296

## 2020-04-23 MED ORDER — NEOSTIGMINE METHYLSULFATE 10 MG/10ML IV SOLN
INTRAVENOUS | Status: DC | PRN
  Administered 2020-04-23: 3 mg via INTRAVENOUS

## 2020-04-23 MED ORDER — METHOCARBAMOL 500 MG PO TABS
500.0000 mg | ORAL_TABLET | Freq: Four times a day (QID) | ORAL | Status: DC | PRN
  Administered 2020-04-23 – 2020-04-25 (×3): 500 mg via ORAL
  Filled 2020-04-23 (×3): qty 1

## 2020-04-23 MED ORDER — ROCURONIUM BROMIDE 10 MG/ML (PF) SYRINGE
PREFILLED_SYRINGE | INTRAVENOUS | Status: AC
  Filled 2020-04-23: qty 10

## 2020-04-23 MED ORDER — OXYCODONE HCL 5 MG PO TABS: 5.0000 mg | ORAL_TABLET | Freq: Once | ORAL | Status: DC | PRN

## 2020-04-23 MED ORDER — NEPRO/CARBSTEADY PO LIQD
237.0000 mL | Freq: Two times a day (BID) | ORAL | Status: DC
  Administered 2020-04-24: 09:00:00 237 mL via ORAL
  Filled 2020-04-23: qty 237

## 2020-04-23 MED ORDER — DOCUSATE SODIUM 100 MG PO CAPS
100.0000 mg | ORAL_CAPSULE | Freq: Two times a day (BID) | ORAL | Status: DC
  Administered 2020-04-23 – 2020-04-28 (×8): 100 mg via ORAL
  Filled 2020-04-23 (×10): qty 1

## 2020-04-23 MED ORDER — POLYETHYLENE GLYCOL 3350 17 G PO PACK
17.0000 g | PACK | Freq: Every day | ORAL | Status: DC | PRN
  Administered 2020-04-25: 22:00:00 17 g via ORAL
  Filled 2020-04-23: qty 1

## 2020-04-23 MED ORDER — ONDANSETRON HCL 4 MG/2ML IJ SOLN
4.0000 mg | Freq: Four times a day (QID) | INTRAMUSCULAR | Status: DC | PRN
  Administered 2020-04-26: 10:00:00 4 mg via INTRAVENOUS
  Filled 2020-04-23: qty 2

## 2020-04-23 MED ORDER — METHOCARBAMOL 1000 MG/10ML IJ SOLN
500.0000 mg | Freq: Four times a day (QID) | INTRAVENOUS | Status: DC | PRN
  Filled 2020-04-23: qty 5

## 2020-04-23 MED ORDER — BISACODYL 10 MG RE SUPP
10.0000 mg | Freq: Every day | RECTAL | Status: DC | PRN
  Administered 2020-04-26: 10:00:00 10 mg via RECTAL
  Filled 2020-04-23: qty 1

## 2020-04-23 MED ORDER — MIDAZOLAM HCL 2 MG/2ML IJ SOLN
INTRAMUSCULAR | Status: DC | PRN
  Administered 2020-04-23: 2 mg via INTRAVENOUS

## 2020-04-23 MED ORDER — NEOSTIGMINE METHYLSULFATE 10 MG/10ML IV SOLN
INTRAVENOUS | Status: AC
  Filled 2020-04-23: qty 1

## 2020-04-23 MED ORDER — PROPOFOL 10 MG/ML IV BOLUS
INTRAVENOUS | Status: AC
  Filled 2020-04-23: qty 20

## 2020-04-23 MED ORDER — ENOXAPARIN SODIUM 30 MG/0.3ML ~~LOC~~ SOLN
30.0000 mg | SUBCUTANEOUS | Status: DC
  Administered 2020-04-24 – 2020-04-28 (×5): 30 mg via SUBCUTANEOUS
  Filled 2020-04-23 (×5): qty 0.3

## 2020-04-23 MED ORDER — ACETAMINOPHEN 10 MG/ML IV SOLN
INTRAVENOUS | Status: AC
  Filled 2020-04-23: qty 100

## 2020-04-23 MED ORDER — SENNA 8.6 MG PO TABS
1.0000 | ORAL_TABLET | Freq: Two times a day (BID) | ORAL | Status: DC
  Administered 2020-04-23 – 2020-04-28 (×8): 8.6 mg via ORAL
  Filled 2020-04-23 (×10): qty 1

## 2020-04-23 MED ORDER — ONDANSETRON HCL 4 MG/2ML IJ SOLN
INTRAMUSCULAR | Status: AC
  Filled 2020-04-23: qty 2

## 2020-04-23 MED ORDER — PHENYLEPHRINE HCL (PRESSORS) 10 MG/ML IV SOLN
INTRAVENOUS | Status: DC | PRN
  Administered 2020-04-23 (×4): 100 ug via INTRAVENOUS

## 2020-04-23 MED ORDER — LIDOCAINE HCL (PF) 2 % IJ SOLN
INTRAMUSCULAR | Status: AC
  Filled 2020-04-23: qty 5

## 2020-04-23 MED ORDER — MIDAZOLAM HCL 2 MG/2ML IJ SOLN
INTRAMUSCULAR | Status: AC
  Filled 2020-04-23: qty 2

## 2020-04-23 MED ORDER — MENTHOL 3 MG MT LOZG
1.0000 | LOZENGE | OROMUCOSAL | Status: DC | PRN
  Filled 2020-04-23: qty 9

## 2020-04-23 MED ORDER — LACTATED RINGERS IV SOLN: INTRAVENOUS | Status: DC

## 2020-04-23 MED ORDER — ONDANSETRON HCL 4 MG/2ML IJ SOLN
INTRAMUSCULAR | Status: DC | PRN
  Administered 2020-04-23: 4 mg via INTRAVENOUS

## 2020-04-23 MED ORDER — VANCOMYCIN HCL IN DEXTROSE 1-5 GM/200ML-% IV SOLN
1000.0000 mg | Freq: Two times a day (BID) | INTRAVENOUS | Status: AC
  Administered 2020-04-23: 23:00:00 1000 mg via INTRAVENOUS
  Filled 2020-04-23 (×2): qty 200

## 2020-04-23 SURGICAL SUPPLY — 44 items
BIT DRILL CANN LG 4.3MM (BIT) ×1 IMPLANT
BNDG COHESIVE 6X5 TAN STRL LF (GAUZE/BANDAGES/DRESSINGS) ×6 IMPLANT
CANISTER SUCT 1200ML W/VALVE (MISCELLANEOUS) ×2 IMPLANT
COVER WAND RF STERILE (DRAPES) ×2 IMPLANT
DRAPE 3/4 80X56 (DRAPES) ×4 IMPLANT
DRAPE SURG 17X11 SM STRL (DRAPES) ×4 IMPLANT
DRAPE U-SHAPE 47X51 STRL (DRAPES) ×2 IMPLANT
DRAPE UTILITY 15X26 TOWEL STRL (DRAPES) ×4 IMPLANT
DRILL BIT CANN LG 4.3MM (BIT) ×2
DRSG OPSITE POSTOP 3X4 (GAUZE/BANDAGES/DRESSINGS) ×4 IMPLANT
DRSG OPSITE POSTOP 4X10 (GAUZE/BANDAGES/DRESSINGS) ×2 IMPLANT
DRSG OPSITE POSTOP 4X14 (GAUZE/BANDAGES/DRESSINGS) IMPLANT
DRSG OPSITE POSTOP 4X6 (GAUZE/BANDAGES/DRESSINGS) ×2 IMPLANT
DURAPREP 26ML APPLICATOR (WOUND CARE) ×4 IMPLANT
ELECT REM PT RETURN 9FT ADLT (ELECTROSURGICAL) ×2
ELECTRODE REM PT RTRN 9FT ADLT (ELECTROSURGICAL) ×1 IMPLANT
GLOVE BIOGEL PI IND STRL 9 (GLOVE) ×1 IMPLANT
GLOVE BIOGEL PI INDICATOR 9 (GLOVE) ×1
GLOVE SURG 9.0 ORTHO LTXF (GLOVE) ×4 IMPLANT
GOWN STRL REUS TWL 2XL XL LVL4 (GOWN DISPOSABLE) ×2 IMPLANT
GOWN STRL REUS W/ TWL LRG LVL3 (GOWN DISPOSABLE) ×1 IMPLANT
GOWN STRL REUS W/TWL LRG LVL3 (GOWN DISPOSABLE) ×2
GUIDEPIN VERSANAIL DSP 3.2X444 (ORTHOPEDIC DISPOSABLE SUPPLIES) ×2 IMPLANT
HEMOVAC 400CC 10FR (MISCELLANEOUS) IMPLANT
HIP FRAC NAIL LAG SCR 10.5X100 (Orthopedic Implant) ×2 IMPLANT
KIT TURNOVER CYSTO (KITS) ×2 IMPLANT
MANIFOLD NEPTUNE II (INSTRUMENTS) ×2 IMPLANT
MAT ABSORB  FLUID 56X50 GRAY (MISCELLANEOUS) ×2
MAT ABSORB FLUID 56X50 GRAY (MISCELLANEOUS) ×1 IMPLANT
NAIL HIP FRACT 130D 11X180 (Screw) ×2 IMPLANT
NEEDLE HYPO 22GX1.5 SAFETY (NEEDLE) ×2 IMPLANT
NS IRRIG 1000ML POUR BTL (IV SOLUTION) ×2 IMPLANT
PACK HIP COMPR (MISCELLANEOUS) ×2 IMPLANT
PAD ARMBOARD 7.5X6 YLW CONV (MISCELLANEOUS) ×2 IMPLANT
SCREW BONE CORTICAL 5.0X3 (Screw) ×2 IMPLANT
SCREW CANN THRD AFF 10.5X100 (Orthopedic Implant) ×1 IMPLANT
STAPLER SKIN PROX 35W (STAPLE) ×4 IMPLANT
SUCTION FRAZIER HANDLE 10FR (MISCELLANEOUS) ×2
SUCTION TUBE FRAZIER 10FR DISP (MISCELLANEOUS) ×1 IMPLANT
SUT VIC AB 0 CT1 36 (SUTURE) ×4 IMPLANT
SUT VIC AB 2-0 CT1 27 (SUTURE) ×2
SUT VIC AB 2-0 CT1 TAPERPNT 27 (SUTURE) ×1 IMPLANT
SUT VICRYL 0 AB UR-6 (SUTURE) ×2 IMPLANT
SYR 30ML LL (SYRINGE) ×2 IMPLANT

## 2020-04-23 NOTE — Progress Notes (Signed)
PT Cancellation Note  Patient Details Name: Amy [name] MRN: [id number] DOB: [birth year]   Cancelled Treatment:    Reason Eval/Treat Not Completed:  (Consult received and chart reviewed.  Patient admitted with acute hip fracture, pending surgical repair later this date.  Will require new orders to initiate care post-op.  Please re-consult once surgery complete and patient appropriate for initiation of mobility.)   Alexandr Oehler H. Owens Shark, PT, DPT, NCS 04/23/20, 10:35 AM [phone]

## 2020-04-23 NOTE — ED Notes (Signed)
Responded to call bell. Pt unable to relax and utilize the purewick. She requested a bedpan. Pt placed on bedpan and able to urinate. Pt performed self hygiene with warm wipes and returned to comfortable position.

## 2020-04-23 NOTE — Anesthesia Procedure Notes (Signed)
Procedure Name: Intubation Date/Time: 04/23/2020 4:55 PM Performed by: Doreen Salvage, CRNA Pre-anesthesia Checklist: Patient identified, Patient being monitored, Timeout performed, Emergency Drugs available and Suction available Patient Re-evaluated:Patient Re-evaluated prior to induction Oxygen Delivery Method: Circle system utilized Preoxygenation: Pre-oxygenation with 100% oxygen Induction Type: IV induction Ventilation: Mask ventilation without difficulty Laryngoscope Size: Mac, 3 and McGraph Grade View: Grade I Tube type: Oral Tube size: 6.5 mm Number of attempts: 1 Airway Equipment and Method: Stylet Placement Confirmation: ETT inserted through vocal cords under direct vision,  positive ETCO2 and breath sounds checked- equal and bilateral Secured at: 19 cm Tube secured with: Tape Dental Injury: Teeth and Oropharynx as per pre-operative assessment

## 2020-04-23 NOTE — Progress Notes (Signed)
OT Cancellation Note  Patient Details Name: Amy [name] MRN: [id number] DOB: 18-Sep-1950   Cancelled Treatment:    Reason Eval/Treat Not Completed: Other (comment). Consult received and chart reviewed. Patient admitted with acute hip fracture, pending surgical repair later this date.  Will require new orders to initiate care post-op. Please re-consult once surgery complete and patient appropriate for initiation of therapy services.   Shara Blazing, M.S., OTR/L Ascom: [phone] 04/23/20, 11:58 AM  04/23/2020, 11:58 AM

## 2020-04-23 NOTE — Anesthesia Postprocedure Evaluation (Signed)
Anesthesia Post Note  Patient: Amy [name]  Procedure(s) Performed: INTRAMEDULLARY (IM) NAIL INTERTROCHANTRIC (Right Hip)  Patient location during evaluation: PACU Anesthesia Type: General Level of consciousness: awake and alert Pain management: pain level controlled Vital Signs Assessment: post-procedure vital signs reviewed and stable Respiratory status: spontaneous breathing, nonlabored ventilation, respiratory function stable and patient connected to nasal cannula oxygen Cardiovascular status: blood pressure returned to baseline and stable Postop Assessment: no apparent nausea or vomiting Anesthetic complications: no   No complications documented.   Last Vitals:  Vitals:   04/23/20 1938 04/23/20 1949  BP:  (!) 182/75  Pulse: 61 (!) 59  Resp: 11 16  Temp:  36.6 C  SpO2: 99% 99%    Last Pain:  Vitals:   04/23/20 1938  TempSrc:   PainSc: 4     LLE Motor Response: Purposeful movement (04/23/20 2026)   RLE Motor Response: Purposeful movement (04/23/20 2026)        Precious Haws [name]

## 2020-04-23 NOTE — Anesthesia Preprocedure Evaluation (Signed)
Anesthesia Evaluation  Patient identified by MRN, date of birth, ID band Patient awake    Reviewed: Allergy & Precautions, H&P , NPO status , Patient's Chart, lab work & pertinent test results  History of Anesthesia Complications Negative for: history of anesthetic complications  Airway Mallampati: III  TM Distance: <3 FB Neck ROM: full    Dental  (+) Chipped   Pulmonary neg pulmonary ROS, neg shortness of breath,    Pulmonary exam normal        Cardiovascular Exercise Tolerance: Good hypertension, (-) angina(-) Past MI Normal cardiovascular exam     Neuro/Psych  Headaches, PSYCHIATRIC DISORDERS  Neuromuscular disease negative psych ROS   GI/Hepatic Neg liver ROS, GERD  ,  Endo/Other  negative endocrine ROS  Renal/GU DialysisRenal disease     Musculoskeletal  (+) Arthritis ,   Abdominal   Peds  Hematology negative hematology ROS (+)   Anesthesia Other Findings Past Medical History: No date: Anemia     Comment:  a. 08/2016 Guaiac + stool. EGD w/ gastritis. No date: Anxiety No date: CKD (chronic kidney disease), stage III ([place]) 11/27/2018: Closed fracture of shaft of left ulna No date: Colon polyp     Comment:  a. 08/2016 Colonoscopy. No date: Gastritis     Comment:  a. 08/2016 EGD: Gastritis-->PPI. No date: GERD (gastroesophageal reflux disease) No date: History of chicken pox No date: History of measles as a child No date: Hypertension No date: Hyponatremia     Comment:  a. 08/2016 in setting of HCTZ Rx. 04/08/2017: Mesenteric lymphadenopathy No date: Multiple thyroid nodules  Past Surgical History: 2003: ABDOMINAL HYSTERECTOMY     Comment:  due to heavy periods and clotting during menses 1990: BREAST LUMPECTOMY; Left 2010: CHOLECYSTECTOMY 09/07/2016: COLONOSCOPY WITH PROPOFOL; N/A     Comment:  Procedure: COLONOSCOPY WITH PROPOFOL;  Surgeon: Lucilla Lame, MD;  Location: [facility] ENDOSCOPY;   Service:               Endoscopy;  Laterality: N/A; 11/16/2018: DIALYSIS/PERMA CATHETER INSERTION; N/A     Comment:  Procedure: DIALYSIS/PERMA CATHETER INSERTION;  Surgeon:               Algernon Huxley, MD;  Location: [place] [facility];                Service: Cardiovascular;  Laterality: N/A; 08/16/2019: DIALYSIS/PERMA CATHETER REMOVAL; N/A     Comment:  Procedure: DIALYSIS/PERMA CATHETER REMOVAL;  Surgeon:               Algernon Huxley, MD;  Location: [place] [facility];                Service: Cardiovascular;  Laterality: N/A; 1985: DILATION AND CURETTAGE OF UTERUS 09/07/2016: ESOPHAGOGASTRODUODENOSCOPY (EGD) WITH PROPOFOL; N/A     Comment:  Procedure: ESOPHAGOGASTRODUODENOSCOPY (EGD) WITH               PROPOFOL;  Surgeon: Lucilla Lame, MD;  Location: [facility]               ENDOSCOPY;  Service: Endoscopy;  Laterality: N/A; 2012: GALLBLADDER SURGERY No date: KNEE SURGERY     Comment:  Dr. Tamala Julian 07/23/2009: MRI, Lumbar spine     Comment:  Multilevel annular bulge and disc protrusion at L2-L3,               L3-L4, L4-L5, and L5-S1 No date: TONSILLECTOMY  1970: TONSILLECTOMY AND ADENOIDECTOMY 03/02/2006: UPPER GI ENDOSCOPY     Comment:  H. Pylori negative; Dr. Allen Norris  BMI    Body Mass Index: 18.88 kg/m      Reproductive/Obstetrics negative OB ROS                             Anesthesia Physical Anesthesia Plan  ASA: III  Anesthesia Plan: General ETT   Post-op Pain Management:    Induction: Intravenous  PONV Risk Score and Plan: Ondansetron, Dexamethasone, Midazolam and Treatment may vary due to age or medical condition  Airway Management Planned: Oral ETT  Additional Equipment:   Intra-op Plan:   Post-operative Plan: Extubation in OR  Informed Consent: I have reviewed the patients History and Physical, chart, labs and discussed the procedure including the risks, benefits and alternatives for the proposed anesthesia with the patient or authorized  representative who has indicated his/her understanding and acceptance.     Dental Advisory Given  Plan Discussed with: Anesthesiologist, CRNA and Surgeon  Anesthesia Plan Comments: (Patient consented for risks of anesthesia including but not limited to:  - adverse reactions to medications - damage to eyes, teeth, lips or other oral mucosa - nerve damage due to positioning  - sore throat or hoarseness - Damage to heart, brain, nerves, lungs, other parts of body or loss of life  Patient voiced understanding.)        Anesthesia Quick Evaluation

## 2020-04-23 NOTE — Progress Notes (Signed)
Initial Nutrition Assessment  DOCUMENTATION CODES:   Not applicable  INTERVENTION:   Nepro Shake po BID, each supplement provides 425 kcal and 19 grams protein  Rena-vit po daily   NUTRITION DIAGNOSIS:   Increased nutrient needs related to hip fracture,other (see comment) (ESRD on PD) as evidenced by estimated needs.  GOAL:   Patient will meet greater than or equal to 90% of their needs  MONITOR:   PO intake,Supplement acceptance,Labs,Weight trends,Skin,I & O's  REASON FOR ASSESSMENT:   Consult Hip fracture protocol  ASSESSMENT:   69 y.o. female with medical history significant of ESRD on PD, refractory HTN, anxiety/depression, anemia secondary to chronic illness, Afib and GERD who is admitted with hip fracture after fall   Unable to see pt today as pt in surgery at time of RD visit. Pt NPO today for intramedullary fixation. RD will add supplements and vitamins to help pt meet her estimated needs and replace losses from PD. Per chart, pt down 11lbs(9%) over the past 4 months; RD unsure if weight loss is significant. RD will obtain nutrition related history and exam at follow-up.   Medications reviewed and include: Phoslo, protonix  Labs reviewed: Na 125(L), BUN 50(H), creat 5.49(H)  NUTRITION - FOCUSED PHYSICAL EXAM: Unable to perform at this time   Diet Order:   Diet Order            Diet NPO time specified  Diet effective midnight                EDUCATION NEEDS:   Not appropriate for education at this time  Skin:  Skin Assessment: Reviewed RN Assessment  Last BM:  pta  Height:   Ht Readings from Last 1 Encounters:  04/22/20 5\' 4"  (1.626 m)    Weight:   Wt Readings from Last 1 Encounters:  04/22/20 49.9 kg    Ideal Body Weight:  54.5 kg  BMI:  Body mass index is 18.88 kg/m.  Estimated Nutritional Needs:   Kcal:  1400-1600kcal/day  Protein:  70-80g/day  Fluid:  1.3-1.5L/day  Koleen Distance MS, RD, LDN Please refer to [facility] for RD  and/or RD on-call/weekend/after hours pager

## 2020-04-23 NOTE — Transfer of Care (Signed)
Immediate Anesthesia Transfer of Care Note  Patient: KINZLY PIERRELOUIS  Procedure(s) Performed: INTRAMEDULLARY (IM) NAIL INTERTROCHANTRIC (Right Hip)  Patient Location: PACU  Anesthesia Type:General  Level of Consciousness: drowsy  Airway & Oxygen Therapy: Patient Spontanous Breathing and Patient connected to face mask oxygen  Post-op Assessment: Report given to RN  Post vital signs: stable  Last Vitals:  Vitals Value Taken Time  BP    Temp    Pulse 67 04/23/20 1828  Resp    SpO2 99 % 04/23/20 1828  Vitals shown include unvalidated device data.  Last Pain:  Vitals:   04/23/20 1548  TempSrc:   PainSc: 9          Complications: No complications documented.

## 2020-04-23 NOTE — Progress Notes (Signed)
[facility], [place] 04/23/20  Subjective:   LOS: 1  Patient presented to the ED after a mechanical fall which resulted in right hip fracture.  She is scheduled for surgery today. No leg edema No shortness of breath Potassium normal at 4.4 Able to void without any problem    Objective:  Vital signs in last 24 hours:  Temp:  [97.6 F (36.4 C)-98.5 F (36.9 C)] 98.3 F (36.8 C) (12/22 1136) Pulse Rate:  [62-74] 67 (12/22 1136) Resp:  [9-18] 16 (12/22 1136) BP: (133-188)/(68-94) 143/68 (12/22 1136) SpO2:  [95 %-100 %] 98 % (12/22 1136) Weight:  [49.9 kg] 49.9 kg (12/21 1541)  Weight change:  Filed Weights   04/22/20 1541  Weight: 49.9 kg    Intake/Output:    Intake/Output Summary (Last 24 hours) at 04/23/2020 1518 Last data filed at 04/23/2020 0732 Gross per 24 hour  Intake --  Output 150 ml  Net -150 ml    Physical Exam: General:  No acute distress, laying in the bed  HEENT  anicteric, moist oral mucous membrane  Pulm/lungs  normal breathing effort, lungs are clear to auscultation  CVS/Heart  regular rhythm, no rub or gallop  Abdomen:   Soft, nontender  Extremities:  No peripheral edema  Neurologic:  Alert, oriented, able to follow commands  Skin:  No acute rashes  PD catheter in place  Basic Metabolic Panel:  Recent Labs  Lab 04/22/20 1644 04/23/20 1048  NA 125* 125*  K 3.6 4.4  CL 89* 90*  CO2 20* 23  GLUCOSE 99 111*  BUN 50* 56*  CREATININE 5.49* 5.72*  CALCIUM 8.1* 8.3*     CBC: Recent Labs  Lab 04/22/20 1644 04/23/20 1048  WBC 10.3 11.0*  NEUTROABS 8.0*  --   HGB 11.9* 12.5  HCT 33.7* 35.5*  MCV 95.5 96.5  PLT 246 217     No results found for: HEPBSAG, HEPBSAB, HEPBIGM    Microbiology:  Recent Results (from the past 240 hour(s))  Resp Panel by RT-PCR (Flu A&B, Covid) Nasopharyngeal Swab     Status: None   Collection Time: 04/22/20  4:44 PM   Specimen: Nasopharyngeal Swab; Nasopharyngeal(NP)  swabs in vial transport medium  Result Value Ref Range Status   SARS Coronavirus 2 by RT PCR NEGATIVE NEGATIVE Final    Comment: (NOTE) SARS-CoV-2 target nucleic acids are NOT DETECTED.  The SARS-CoV-2 RNA is generally detectable in upper respiratory specimens during the acute phase of infection. The lowest concentration of SARS-CoV-2 viral copies this assay can detect is 138 copies/mL. A negative result does not preclude SARS-Cov-2 infection and should not be used as the sole basis for treatment or other patient management decisions. A negative result may occur with  improper specimen collection/handling, submission of specimen other than nasopharyngeal swab, presence of viral mutation(s) within the areas targeted by this assay, and inadequate number of viral copies(<138 copies/mL). A negative result must be combined with clinical observations, patient history, and epidemiological information. The expected result is Negative.  Fact Sheet for Patients:  EntrepreneurPulse.com.au  Fact Sheet for Healthcare Providers:  IncredibleEmployment.be  This test is no t yet approved or cleared by the [place] FDA and  has been authorized for detection and/or diagnosis of SARS-CoV-2 by FDA under an Emergency Use Authorization (EUA). This EUA will remain  in effect (meaning this test can be used) for the duration of the COVID-19 declaration under Section 564(b)(1) of the Act, 21 U.S.C.section 360bbb-3(b)(1), unless the  authorization is terminated  or revoked sooner.       Influenza A by PCR NEGATIVE NEGATIVE Final   Influenza B by PCR NEGATIVE NEGATIVE Final    Comment: (NOTE) The Xpert Xpress SARS-CoV-2/FLU/RSV plus assay is intended as an aid in the diagnosis of influenza from Nasopharyngeal swab specimens and should not be used as a sole basis for treatment. Nasal washings and aspirates are unacceptable for Xpert Xpress  SARS-CoV-2/FLU/RSV testing.  Fact Sheet for Patients: EntrepreneurPulse.com.au  Fact Sheet for Healthcare Providers: IncredibleEmployment.be  This test is not yet approved or cleared by the [place] FDA and has been authorized for detection and/or diagnosis of SARS-CoV-2 by FDA under an Emergency Use Authorization (EUA). This EUA will remain in effect (meaning this test can be used) for the duration of the COVID-19 declaration under Section 564(b)(1) of the Act, 21 U.S.C. section 360bbb-3(b)(1), unless the authorization is terminated or revoked.  Performed at [facility], [address] [place], [place] [address]     Coagulation Studies: No results for input(s): LABPROT, INR in the last 72 hours.  Urinalysis: No results for input(s): COLORURINE, LABSPEC, PHURINE, GLUCOSEU, HGBUR, BILIRUBINUR, KETONESUR, PROTEINUR, UROBILINOGEN, NITRITE, LEUKOCYTESUR in the last 72 hours.  Invalid input(s): APPERANCEUR    Imaging: No results found.   Medications:   . vancomycin     . amLODipine  10 mg Oral Daily  . buPROPion  75 mg Oral BID  . calcium acetate  667 mg Oral TID WC  . carvedilol  25 mg Oral BID WC  . cloNIDine  0.2 mg Oral BID  . losartan  100 mg Oral QHS  . pantoprazole  40 mg Oral Daily  . zolpidem  5 mg Oral QHS   butalbital-acetaminophen-caffeine, hydrALAZINE, HYDROcodone-acetaminophen, HYDROmorphone (DILAUDID) injection, LORazepam, oxyCODONE  Assessment/ Plan:  69 y.o. female with ESRD, hypertension, atrial fibrillation, anemia, history of depression, hospitalization for syncope August 2021 was admitted on 04/22/2020 for  Active Problems:   Closed right hip fracture, initial encounter [facility])  Hip fracture ([facility]) [S72.009A] Closed right hip fracture, initial encounter ([place]) [S72.001A]  #. ESRD [name]/4 x 2000 cc/no last fill/EDW 51 kg. Patient to undergo surgery today She has good residual function Plan for  resuming peritoneal dialysis Thursday night  #. Anemia of CKD  Lab Results  Component Value Date   HGB 12.5 04/23/2020   Low dose EPO for hemoglobin less than 11  #. Secondary hyperparathyroidism of renal origin N 25.81   No results found for: PTH Lab Results  Component Value Date   PHOS 6.1 (H) 11/08/2018   Monitor calcium and phos level during this admission   #.  Right hip closed fracture Scheduled for surgery later this afternoon.   #chronic hyponatremia Ranges from 125-131   LOS: Grandfather 12/22/20213:18 [place], [place]

## 2020-04-23 NOTE — Op Note (Signed)
DATE OF SURGERY:  04/23/2020  TIME: 6:41 PM  PATIENT NAME:  Amy [name]  AGE: 69 y.o.  PRE-OPERATIVE DIAGNOSIS:  Right Intertrochanteric Hip Fracture  POST-OPERATIVE DIAGNOSIS:  SAME  PROCEDURE:  INTRAMEDULLARY FIXATION FOR RIGHT INTERTROCHANTERIC HIP FRACTURE  SURGEON:  [place]  OPERATIVE IMPLANTS: Biomet short Affixus nail 11 x 180, 100 mm lag screw with a 34 mm distal interlocking screw  PREOPERATIVE INDICATIONS:  Amy [name] is a 69 y.o. year old who fell leaving an orthopedic office and suffered a right intertrochanteric hip fracture yesterday afternoon. She was brought into the [facility] ER and then admitted and medically cleared for surgical intervention.    The risks, benefits and alternatives were discussed with the patient and their family.  The risks include but are not limited to infection, bleeding, nerve or blood vessel injury, malunion, nonunion, hardware prominence, hardware failure, change in leg lengths or lower extremity rotation need for further surgery including hardware removal with conversion to a total hip arthroplasty. Medical risks include but are not limited to DVT and pulmonary embolism, myocardial infarction, stroke, pneumonia, respiratory failure and death. The patient and their family understood these risks and wished to proceed with surgery.  OPERATIVE PROCEDURE:  The patient was brought to the operating room and placed in the supine position on the fracture table.  General anesthesia was administered.  A time out was performed to verify the patient's name, date of birth, medical record number, correct site of surgery correct procedure to be performed. The timeout was also used to verify the patient received antibiotics and all appropriate instruments, implants and radiographic studies were available in the room. Once all in attendance were in agreement, the case began. A closed reduction was performed under C-arm guidance.  The fracture reduction was  confirmed on both AP and lateral views.  The patient was prepped and draped in a sterile fashion. She received preoperative antibiotics with 1 g of vancomycin IV given her allergy to cephalosporins and penicillin.  An incision was made proximal to the greater trochanter in line with the femur. A guidewire was placed over the tip of the greater trochanter and advanced into the proximal femur to the level of the lesser trochanter.  Confirmation of the drill pin position was made on AP and lateral C-arm images.  The threaded guidepin was then overdrilled with the proximal femoral drill.  The 11 x 180 mm nail was then inserted into the proximal femur, across the fracture site and into the femoral shaft. Its position was confirmed on AP and lateral C-arm images.   Once the nail was completely seated, the drill guide for the lag screw was placed through the guide arm for the Affixus nail. A guidepin was then placed through this drill guide and advanced through the lateral cortex of the femur, across the fracture site and into the femoral head achieving a tip apex distance of less than 25 mm. The depth of the drill pin was measured to be 100 mm, and then the drill for the lag screw was advanced over the guidepin and through the lateral cortex, across the fracture site and up into the femoral head to the depth previously measured.  The lag screw was then advanced by hand into position across the fracture site into the femoral head. Its final position was confirmed on AP and lateral C-arm images. Compression was applied as traction was carefully released. The set screw in the top of the intramedullary rod was tightened by  hand using a screwdriver. It was backed off a quarter turn to allow for compression at the fracture site.  The drill sleeve for the distal interlocking screw was then placed through the Affixus guide arm. A small stab incision was made to allow the drill guide to approximate the lateral cortex of the  femur. The drill for the distal interlocking screw was then advanced bicortically. The depth of this drill was measured. A distal interlocking screw with the length of 53mm was then inserted by hand through the guide arm. Final C-arm images of the entire intramedullary construct were taken in both the AP and lateral planes.   The wounds were irrigated copiously and closed with 0 Vicryl for closure of the deep fascia and 2-0 Vicryl for subcutaneous closure. The skin was approximated with staples. A dry sterile dressing was applied. I was scrubbed and present the entire case and all sharp and instrument counts were correct at the conclusion of the case. Patient was transferred to hospital bed and brought to PACU in stable condition. I spoke with the patient's family in the operatively to let them know the case had been performed without complication and the patient was stable in the recovery room.   Timoteo Gaul, MD

## 2020-04-23 NOTE — ED Notes (Signed)
Pt assisted on and off the bed pan. Pt cleaned at this time. Pt requesting pain medication. Primary RN made aware.

## 2020-04-24 ENCOUNTER — Encounter: Payer: Self-pay | Admitting: Orthopedic Surgery

## 2020-04-24 DIAGNOSIS — F419 Anxiety disorder, unspecified: Secondary | ICD-10-CM

## 2020-04-24 DIAGNOSIS — F32A Depression, unspecified: Secondary | ICD-10-CM

## 2020-04-24 DIAGNOSIS — D509 Iron deficiency anemia, unspecified: Secondary | ICD-10-CM | POA: Diagnosis not present

## 2020-04-24 DIAGNOSIS — N2581 Secondary hyperparathyroidism of renal origin: Secondary | ICD-10-CM | POA: Diagnosis not present

## 2020-04-24 DIAGNOSIS — N186 End stage renal disease: Secondary | ICD-10-CM | POA: Diagnosis not present

## 2020-04-24 DIAGNOSIS — Z992 Dependence on renal dialysis: Secondary | ICD-10-CM | POA: Diagnosis not present

## 2020-04-24 LAB — BASIC METABOLIC PANEL
Anion gap: 11 (ref 5–15)
BUN: 58 mg/dL — ABNORMAL HIGH (ref 8–23)
CO2: 21 mmol/L — ABNORMAL LOW (ref 22–32)
Calcium: 8.1 mg/dL — ABNORMAL LOW (ref 8.9–10.3)
Chloride: 94 mmol/L — ABNORMAL LOW (ref 98–111)
Creatinine, Ser: 5.82 mg/dL — ABNORMAL HIGH (ref 0.44–1.00)
GFR, Estimated: 7 mL/min — ABNORMAL LOW (ref >60)
Glucose, Bld: 116 mg/dL — ABNORMAL HIGH (ref 70–99)
Potassium: 4.5 mmol/L (ref 3.5–5.1)
Sodium: 126 mmol/L — ABNORMAL LOW (ref 135–145)

## 2020-04-24 LAB — CBC
HCT: 31 % — ABNORMAL LOW (ref 36.0–46.0)
Hemoglobin: 10.7 g/dL — ABNORMAL LOW (ref 12.0–15.0)
MCH: 33.1 pg (ref 26.0–34.0)
MCHC: 34.5 g/dL (ref 30.0–36.0)
MCV: 96 fL (ref 80.0–100.0)
Platelets: 195 10*3/uL (ref 150–400)
RBC: 3.23 MIL/uL — ABNORMAL LOW (ref 3.87–5.11)
RDW: 15.2 % (ref 11.5–15.5)
WBC: 10.9 10*3/uL — ABNORMAL HIGH (ref 4.0–10.5)
nRBC: 0 % (ref 0.0–0.2)

## 2020-04-24 MED ORDER — HEPARIN 1000 UNIT/ML FOR PERITONEAL DIALYSIS
500.0000 [IU] | INTRAMUSCULAR | Status: DC | PRN
  Filled 2020-04-24: qty 0.5

## 2020-04-24 MED ORDER — GENTAMICIN SULFATE 0.1 % EX CREA
1.0000 "application " | TOPICAL_CREAM | Freq: Every day | CUTANEOUS | Status: DC
  Administered 2020-04-24 – 2020-04-27 (×3): 1 via TOPICAL
  Filled 2020-04-24: qty 15

## 2020-04-24 NOTE — Progress Notes (Signed)
[place] [facility] [place] NAME: Amy [name]    MR#:  [id number]  DATE OF BIRTH:  1950-06-05  SUBJECTIVE:  CHIEF COMPLAINT:   Chief Complaint  Patient presents with  . Hip Injury  Complains of hip pain.  Hungry, requesting some water REVIEW OF SYSTEMS:  Review of Systems  Constitutional: Negative for diaphoresis, fever, malaise/fatigue and weight loss.  HENT: Negative for ear discharge, ear pain, hearing loss, nosebleeds, sore throat and tinnitus.   Eyes: Negative for blurred vision and pain.  Respiratory: Negative for cough, hemoptysis, shortness of breath and wheezing.   Cardiovascular: Negative for chest pain, palpitations, orthopnea and leg swelling.  Gastrointestinal: Negative for abdominal pain, blood in stool, constipation, diarrhea, heartburn, nausea and vomiting.  Genitourinary: Negative for dysuria, frequency and urgency.  Musculoskeletal: Positive for falls and joint pain. Negative for back pain and myalgias.  Skin: Negative for itching and rash.  Neurological: Negative for dizziness, tingling, tremors, focal weakness, seizures, weakness and headaches.  Psychiatric/Behavioral: Negative for depression. The patient is not nervous/anxious.    DRUG ALLERGIES:   Allergies  Allergen Reactions  . Cephalosporins Hives and Swelling  . Clarithromycin   . Lunesta  [Eszopiclone]     Heart racing  . Penicillin V     Has patient had a PCN reaction causing immediate rash, facial/tongue/throat swelling, SOB or lightheadedness with hypotension: Yes Has patient had a PCN reaction causing severe rash involving mucus membranes or skin necrosis: No Has patient had a PCN reaction that required hospitalization No Has patient had a PCN reaction occurring within the last 10 years: No If all of the above answers are "NO", then may proceed with Cephalosporin use.  . Sulfa Antibiotics    VITALS:  Blood pressure (!) 141/71, pulse 65, temperature 99.3 F (37.4  C), resp. rate 16, height 5\' 4"  (1.626 m), weight 49.9 kg, SpO2 100 %. PHYSICAL EXAMINATION:  Physical Exam HENT:     Head: Normocephalic and atraumatic.  Eyes:     Extraocular Movements: EOM normal.     Conjunctiva/sclera: Conjunctivae normal.     Pupils: Pupils are equal, round, and reactive to light.  Neck:     Thyroid: No thyromegaly.     Trachea: No tracheal deviation.  Cardiovascular:     Rate and Rhythm: Normal rate and regular rhythm.     Heart sounds: Normal heart sounds.  Pulmonary:     Effort: Pulmonary effort is normal. No respiratory distress.     Breath sounds: Normal breath sounds. No wheezing.  Chest:     Chest wall: No tenderness.  Abdominal:     General: Bowel sounds are normal. There is no distension.     Palpations: Abdomen is soft.     Tenderness: There is no abdominal tenderness.  Musculoskeletal:     Cervical back: Normal range of motion and neck supple.     Right hip: Deformity and tenderness present. Decreased range of motion.  Skin:    General: Skin is warm and dry.     Findings: No rash.  Neurological:     Mental Status: She is alert and oriented to person, place, and time.     Cranial Nerves: No cranial nerve deficit.    LABORATORY PANEL:  Female CBC Recent Labs  Lab 04/24/20 0554  WBC 10.9*  HGB 10.7*  HCT 31.0*  PLT 195   ------------------------------------------------------------------------------------------------------------------ Chemistries  Recent Labs  Lab 04/24/20 0554  NA  126*  K 4.5  CL 94*  CO2 21*  GLUCOSE 116*  BUN 58*  CREATININE 5.82*  CALCIUM 8.1*   RADIOLOGY:  DG Hip Port Unilat With Pelvis 1V Right  Result Date: 04/23/2020 CLINICAL DATA:  69 year old female status post ORIF. EXAM: DG HIP (WITH OR WITHOUT PELVIS) 1V PORT RIGHT COMPARISON:  Earlier intraoperative fluoroscopic study dated 04/23/2020. FINDINGS: Status post ORIF of right femoral neck fracture. The hardware appears intact. There is no  dislocation. The bones are osteopenic. Postsurgical changes in the soft tissues of the right hip and cutaneous staples. IMPRESSION: Status post ORIF of right femoral neck fracture. Electronically Signed   By: Anner Crete M.D.   On: 04/23/2020 19:33   DG HIP OPERATIVE UNILAT W OR W/O PELVIS RIGHT  Result Date: 04/23/2020 CLINICAL DATA:  Intramedullary rod placement EXAM: OPERATIVE RIGHT HIP (WITH PELVIS IF PERFORMED) 2 VIEWS TECHNIQUE: Fluoroscopic spot image(s) were submitted for interpretation post-operatively. COMPARISON:  None. FINDINGS: Five fluoroscopic images are obtained during the performance of the procedure and are provided for interpretation only. Intramedullary rod with proximal dynamic and distal interlocking screw traverses a right femoral neck fracture. Alignment is anatomic. FLUOROSCOPY TIME:  1 minutes IMPRESSION: 1. ORIF right femoral neck fracture with anatomic alignment. Electronically Signed   By: Randa Ngo M.D.   On: 04/23/2020 19:51   ASSESSMENT AND PLAN:  69 year old female with a known history of ESRD on PD, refractory hypertension, anxiety/depression, anemia of chronic kidney disease, GERD is admitted for right hip fracture status post mechanical fall.  Right intertrochanteric hip fracture Medically clear for planned surgery.  At moderate risk considering her age and medical problems Orthopedic planning for surgery later today. Pain medicine and DVT prophylaxis per Ortho  Uncontrolled hypertension Likely due to pain.  Continue Norvasc, Coreg, clonidine, losartan and as needed hydralazine  Anxiety and depression Continue Wellbutrin and Ativan as needed  ESRD on peritoneal dialysis Further management per nephrology  Chronic hyponatremia Sodium ranging from 125-130 Asymptomatic  Body mass index is 18.88 kg/m.      Status is: Inpatient  Remains inpatient appropriate because:Inpatient level of care appropriate due to severity of illness   Dispo:  The patient is from: Home              Anticipated d/c is to: Home with home health              Anticipated d/c date is: 2 days              Patient currently is not medically stable to d/c.  Will be undergoing hip pinning later today    DVT prophylaxis:       enoxaparin (LOVENOX) injection 30 mg Start: 04/24/20 1000 SCDs Start: 04/23/20 1951 Place TED hose Start: 04/23/20 1951     Family Communication:  "discussed with patient")   All the records are reviewed and case discussed with Care Management/Social Worker. Management plans discussed with the patient, family and they are in agreement.  CODE STATUS: Full Code  TOTAL TIME TAKING CARE OF THIS PATIENT: 35 minutes.   More than 50% of the time was spent in counseling/coordination of care: YES  POSSIBLE D/C IN 1-2 DAYS, DEPENDING ON CLINICAL CONDITION.   Max Sane M.D on 04/24/2020 at 2:31 PM  [facility]   CC: Primary care physician; [name], MD  Note: This dictation was prepared with Dragon dictation along with smaller phrase technology. Any transcriptional errors that result from this process are  unintentional.

## 2020-04-24 NOTE — Progress Notes (Signed)
Subjective:  Patient seen at 1 pm today.  POD #1 s/p intramedullary fixation for right intertrochanteric hip fracture.   Patient reports right hip pain as moderate.  Patient was OOB to a chair and tolerating a PO diet.  Objective:   VITALS:   Vitals:   04/24/20 0440 04/24/20 0920 04/24/20 1234 04/24/20 1626  BP: (!) 145/65 (!) 170/74 (!) 141/71 137/68  Pulse: 62 64 65 65  Resp: 17 16 16 17   Temp: 98.4 F (36.9 C) 99 F (37.2 C) 99.3 F (37.4 C) 98.6 F (37 C)  TempSrc:  Oral    SpO2: 98% 100% 100% 99%  Weight:      Height:        PHYSICAL EXAM: Right lower extremity: Neurovascular intact Sensation intact distally Intact pulses distally Dorsiflexion/Plantar flexion intact Incision: dressing C/D/I No cellulitis present Compartment soft  LABS  Results for orders placed or performed during the hospital encounter of 04/22/20 (from the past 24 hour(s))  CBC     Status: Abnormal   Collection Time: 04/24/20  5:54 AM  Result Value Ref Range   WBC 10.9 (H) 4.0 - 10.5 K/uL   RBC 3.23 (L) 3.87 - 5.11 MIL/uL   Hemoglobin 10.7 (L) 12.0 - 15.0 g/dL   HCT 31.0 (L) 36.0 - 46.0 %   MCV 96.0 80.0 - 100.0 fL   MCH 33.1 26.0 - 34.0 pg   MCHC 34.5 30.0 - 36.0 g/dL   RDW 15.2 11.5 - 15.5 %   Platelets 195 150 - 400 K/uL   nRBC 0.0 0.0 - 0.2 %  Basic metabolic panel     Status: Abnormal   Collection Time: 04/24/20  5:54 AM  Result Value Ref Range   Sodium 126 (L) 135 - 145 mmol/L   Potassium 4.5 3.5 - 5.1 mmol/L   Chloride 94 (L) 98 - 111 mmol/L   CO2 21 (L) 22 - 32 mmol/L   Glucose, Bld 116 (H) 70 - 99 mg/dL   BUN 58 (H) 8 - 23 mg/dL   Creatinine, Ser 5.82 (H) 0.44 - 1.00 mg/dL   Calcium 8.1 (L) 8.9 - 10.3 mg/dL   GFR, Estimated 7 (L) >60 mL/min   Anion gap 11 5 - 15    DG Hip Port Unilat With Pelvis 1V Right  Result Date: 04/23/2020 CLINICAL DATA:  69 year old female status post ORIF. EXAM: DG HIP (WITH OR WITHOUT PELVIS) 1V PORT RIGHT COMPARISON:  Earlier  intraoperative fluoroscopic study dated 04/23/2020. FINDINGS: Status post ORIF of right femoral neck fracture. The hardware appears intact. There is no dislocation. The bones are osteopenic. Postsurgical changes in the soft tissues of the right hip and cutaneous staples. IMPRESSION: Status post ORIF of right femoral neck fracture. Electronically Signed   By: Anner Crete M.D.   On: 04/23/2020 19:33   DG HIP OPERATIVE UNILAT W OR W/O PELVIS RIGHT  Result Date: 04/23/2020 CLINICAL DATA:  Intramedullary rod placement EXAM: OPERATIVE RIGHT HIP (WITH PELVIS IF PERFORMED) 2 VIEWS TECHNIQUE: Fluoroscopic spot image(s) were submitted for interpretation post-operatively. COMPARISON:  None. FINDINGS: Five fluoroscopic images are obtained during the performance of the procedure and are provided for interpretation only. Intramedullary rod with proximal dynamic and distal interlocking screw traverses a right femoral neck fracture. Alignment is anatomic. FLUOROSCOPY TIME:  1 minutes IMPRESSION: 1. ORIF right femoral neck fracture with anatomic alignment. Electronically Signed   By: Randa Ngo M.D.   On: 04/23/2020 19:51    Assessment/Plan: 1 Day Post-Op  Active Problems:   Closed right hip fracture, initial encounter [facility])  Patient doing well post-op.  She is already OOB to a chair.  Pain moderate.   Continue current pain management and physical therapy.      [place] , MD 04/24/2020, 5:58 PM

## 2020-04-24 NOTE — Evaluation (Signed)
Occupational Therapy Evaluation Patient Details Name: Amy [name] MRN: [id number] DOB: [birth year] Today's Date: 04/24/2020    History of Present Illness Amy [name] is a 69 y.o. female who sustained a right intertrochanteric hip fx following a mechanical fall. Intramedullary fixation performed 04/23/2020. Pt's PMHx includes ESRD on PD, refractory HTN, anxiety/depression, anemia secondary to chronic illness, GERD.   Clinical Impression   Amy [name] was seen for OT evaluation this date, POD#1 from above surgery. She was independent in all ADLs prior to surgery, driving, retired, active in community. She describes 7/10 pain this morning but is enthusiastic about engaging in therapy and motivated to return home ASAP. She was able to perform bed mobility and transfers with little physical assistance and quickly picked up instructions for safe use of/hand placement with RW. Therapist provided educ re: self care skills, falls prevention strategies, home/routines modifications, DME/AE for LB bathing and dressing tasks, compression stocking mgt strategies, and car transfer techniques; both pt and spouse verbalize understanding. Pt would benefit from ongoing OT while hospitalized. Recommend HHOT post D/C, to maximize pt safety and return to PLOF.      Follow Up Recommendations  Home health OT    Equipment Recommendations  None recommended by OT (pt has already arranged for a RW and BSC at home)    Recommendations for Other Services       Precautions / Restrictions Precautions Precautions: Fall Restrictions Weight Bearing Restrictions: Yes RLE Weight Bearing: Weight bearing as tolerated      Mobility Bed Mobility Overal bed mobility: Needs Assistance Bed Mobility: Supine to Sit;Sit to Supine     Supine to sit: Min assist Sit to supine: Min assist        Transfers Overall transfer level: Needs assistance Equipment used: Rolling walker (2 wheeled) Transfers: Sit to/from Stand Sit  to Stand: Min guard              Balance Overall balance assessment: Needs assistance Sitting-balance support: Feet supported;No upper extremity supported Sitting balance-Leahy Scale: Good     Standing balance support: Bilateral upper extremity supported Standing balance-Leahy Scale: Fair                             ADL either performed or assessed with clinical judgement   ADL Overall ADL's : Needs assistance/impaired Eating/Feeding: Independent   Grooming: Independent;Set up               Lower Body Dressing: Moderate assistance                       Vision Patient Visual Report: No change from baseline       Perception     Praxis      Pertinent Vitals/Pain Pain Assessment: 0-10 Pain Score: 7  Pain Location: R hip, exacerbated with movement Pain Descriptors / Indicators: Sharp Pain Intervention(s): Limited activity within patient's tolerance;Repositioned;Monitored during session;Premedicated before session     Hand Dominance     Extremity/Trunk Assessment Upper Extremity Assessment Upper Extremity Assessment: Overall WFL for tasks assessed   Lower Extremity Assessment Lower Extremity Assessment: RLE deficits/detail RLE Deficits / Details: s/p R hip intramedullary fixation       Communication Communication Communication: No difficulties   Cognition Arousal/Alertness: Awake/alert Behavior During Therapy: WFL for tasks assessed/performed Overall Cognitive Status: Within Functional Limits for tasks assessed  General Comments       Exercises Other Exercises Other Exercises: provided educ re: AE for LB dressing/bathing; pain mgmt strategies; home/routine modifications; safe use of DME. Bed mobility, transfers.   Shoulder Instructions      Home Living Family/patient expects to be discharged to:: Private residence Living Arrangements: Spouse/significant other Available  Help at Discharge: Family;Available 24 hours/day (husband available 24/7 until May 05, 2020.) Type of Home: House Home Access: Stairs to enter [employer] of Steps: 2 Entrance Stairs-Rails: None Home Layout: Able to live on main level with bedroom/bathroom;Two level     Bathroom Shower/Tub: Occupational psychologist: Standard     Home Equipment: Cane - single point;Bedside commode;Walker - 2 wheels          Prior Functioning/Environment Level of Independence: Independent                 OT Problem List: Decreased strength;Impaired balance (sitting and/or standing);Pain;Decreased range of motion;Decreased knowledge of use of DME or AE;Decreased coordination;Decreased activity tolerance      OT Treatment/Interventions:      OT Goals(Current goals can be found in the care plan section) Acute Rehab OT Goals Patient Stated Goal: to be home for Christmas OT Goal Formulation: With patient Time For Goal Achievement: 05/08/20 Potential to Achieve Goals: Good ADL Goals Pt Will Perform Lower Body Bathing: Independently (using AE as necessary) Pt Will Perform Lower Body Dressing: Independently (with AE as necessary) Pt Will Transfer to Toilet: stand pivot transfer;with modified independence (using AD as necessary)  OT Frequency: Min 1X/week   Barriers to D/C:            Co-evaluation              AM-PAC OT "6 Clicks" Daily Activity     Outcome Measure Help from another person eating meals?: None Help from another person taking care of personal grooming?: None Help from another person toileting, which includes using toliet, bedpan, or urinal?: A Little Help from another person bathing (including washing, rinsing, drying)?: A Little Help from another person to put on and taking off regular upper body clothing?: None Help from another person to put on and taking off regular lower body clothing?: A Lot 6 Click Score: 20   End of Session Equipment  Utilized During Treatment: Rolling walker  Activity Tolerance: Patient tolerated treatment well Patient left: in chair;with call bell/phone within reach;with chair alarm set;with nursing/sitter in room  OT Visit Diagnosis: Unsteadiness on feet (R26.81);Muscle weakness (generalized) (M62.81);Other abnormalities of gait and mobility (R26.89);Pain Pain - Right/Left: Right Pain - part of body: Hip                Time: 1111-1140 OT Time Calculation (min): 29 min Charges:  OT General Charges $OT Visit: 1 Visit OT Evaluation $OT Eval Low Complexity: 1 Low OT Treatments $Self Care/Home Management : 23-37 mins  Josiah Lobo, PhD, MS, OTR/L ascom [phone] 04/24/20, 12:57 PM

## 2020-04-24 NOTE — Evaluation (Signed)
Physical Therapy Evaluation Patient Details Name: Amy [name] MRN: [id number] DOB: 1950/09/23 Today's Date: 04/24/2020   History of Present Illness  Amy [name] is a 69 y.o. female who sustained a right intertrochanteric hip fx following a mechanical fall. Intramedullary fixation performed 04/23/2020. Pt's PMHx includes ESRD on PD, refractory HTN, anxiety/depression, anemia secondary to chronic illness, GERD.  Clinical Impression  Upon evaluation, patient alert and oriented; follows commands and agreeable to session.  Eager for OOB efforts as tolerated.  Rates R hip pain 6/10, premedicated prior to session.  Good R LE strength (at least 3-/5) and ROM noted with isolated therex; limited by pain.  Currently requiring min assist for bed mobility; min assist for sit/stand, basic transfers and gait (5' x2) with RW. Demonstrates 3-point, step to gait pattern; decreased R LE stance time/loading, difficulty unweighting/advancing L LE at times (attempts scooting across floor).  Heavy WBing bilat UEs.  Very eager, motivated to progress as able. Would benefit from skilled PT to address above deficits and promote optimal return to PLOF.; Recommend transition to HHPT upon discharge from acute hospitalization.     Follow Up Recommendations Home health PT    Equipment Recommendations  Rolling walker with 5" wheels;3in1 (PT)    Recommendations for Other Services       Precautions / Restrictions Precautions Precautions: Fall Restrictions Weight Bearing Restrictions: Yes RLE Weight Bearing: Weight bearing as tolerated      Mobility  Bed Mobility Overal bed mobility: Needs Assistance Bed Mobility: Supine to Sit     Supine to sit: Min assist     General bed mobility comments: assist for R LE management    Transfers Overall transfer level: Needs assistance Equipment used: Rolling walker (2 wheeled) Transfers: Sit to/from Stand Sit to Stand: Min assist         General transfer  comment: cuing for hand placement, transfer mechanics  Ambulation/Gait Ambulation/Gait assistance: Min assist Gait Distance (Feet): 5 Feet Assistive device: Rolling walker (2 wheeled)       General Gait Details: 3-point, step to gait pattern; decreased R LE stance time/loading, difficulty unweighting/advancing L LE at times (attempts scooting across floor).  Heavy WBing bilat UEs.  Very eager, motivated to progress as able.  Stairs            Wheelchair Mobility    Modified Rankin (Stroke Patients Only)       Balance Overall balance assessment: Needs assistance Sitting-balance support: No upper extremity supported;Feet supported Sitting balance-Leahy Scale: Good     Standing balance support: Bilateral upper extremity supported Standing balance-Leahy Scale: Fair                               Pertinent Vitals/Pain Pain Assessment: 0-10 Pain Score: 6  Pain Location: R hip Pain Descriptors / Indicators: Aching;Grimacing;Guarding Pain Intervention(s): Limited activity within patient's tolerance;Monitored during session;Repositioned;Premedicated before session    Home Living Family/patient expects to be discharged to:: Private residence Living Arrangements: Spouse/significant other Available Help at Discharge: Family;Available 24 hours/day Type of Home: House Home Access: Stairs to enter Entrance Stairs-Rails: None Entrance Stairs-Number of Steps: 2 Home Layout: Able to live on main level with bedroom/bathroom;Two level Home Equipment: Cane - single point;Bedside commode;Walker - 2 wheels      Prior Function Level of Independence: Independent         Comments: Indep without assist device for ADLs, household and community mobilization; works from home for  [employer].  Denies additional fall history.     Hand Dominance        Extremity/Trunk Assessment   Upper Extremity Assessment Upper Extremity Assessment: Overall WFL for  tasks assessed    Lower Extremity Assessment Lower Extremity Assessment: Generalized weakness (R LE grossly 3-/5, limited by pain; fair/good isolated active movement)       Communication   Communication: No difficulties  Cognition Arousal/Alertness: Awake/alert Behavior During Therapy: WFL for tasks assessed/performed Overall Cognitive Status: Within Functional Limits for tasks assessed                                        General Comments      Exercises Other Exercises Other Exercises: Supine R LE therex, 1x10, act/act assist ROM: ankle pumps, quad sets, SAQs, heel slides, hip abduct/adduct.  Good isolated ROM Other Exercises: Toilet transfer, SPT with RW, min assist; sit/stand from [facility] with RW, min assist.  Good carry-over of transfer technique   Assessment/Plan    PT Assessment Patient needs continued PT services  PT Problem List Decreased strength;Decreased range of motion;Decreased activity tolerance;Decreased balance;Decreased mobility;Decreased knowledge of use of DME;Decreased safety awareness;Decreased knowledge of precautions;Pain;Decreased skin integrity       PT Treatment Interventions DME instruction;Gait training;Stair training;Functional mobility training;Therapeutic activities;Therapeutic exercise;Balance training;Patient/family education    PT Goals (Current goals can be found in the Care Plan section)  Acute Rehab PT Goals Patient Stated Goal: to be home for Christmas PT Goal Formulation: With patient Time For Goal Achievement: 05/08/20 Potential to Achieve Goals: Good    Frequency BID   Barriers to discharge        Co-evaluation               AM-PAC PT "6 Clicks" Mobility  Outcome Measure Help needed turning from your back to your side while in a flat bed without using bedrails?: A Little Help needed moving from lying on your back to sitting on the side of a flat bed without using bedrails?: A Lot Help needed moving to and  from a bed to a chair (including a wheelchair)?: A Little Help needed standing up from a chair using your arms (e.g., wheelchair or bedside chair)?: A Little Help needed to walk in hospital room?: A Little Help needed climbing 3-5 steps with a railing? : A Lot 6 Click Score: 16    End of Session Equipment Utilized During Treatment: Gait belt Activity Tolerance: Patient tolerated treatment well Patient left: in chair;with call bell/phone within reach;with chair alarm set Nurse Communication: Mobility status PT Visit Diagnosis: Muscle weakness (generalized) (M62.81);Difficulty in walking, not elsewhere classified (R26.2);Pain Pain - Right/Left: Right Pain - part of body: Hip    Time: 6812-7517 PT Time Calculation (min) (ACUTE ONLY): 40 min   Charges:   PT Evaluation $PT Eval Moderate Complexity: 1 Mod PT Treatments $Therapeutic Exercise: 8-22 mins $Therapeutic Activity: 8-22 mins        Cheney Gosch H. Owens Shark, PT, DPT, NCS 04/24/20, 4:54 PM [phone]

## 2020-04-24 NOTE — Progress Notes (Signed)
Physical Therapy Treatment Patient Details Name: Amy [name] MRN: [id number] DOB: [birth year] Today's Date: 04/24/2020    History of Present Illness Amy [name] is a 69 y.o. female who sustained a right intertrochanteric hip fx following a mechanical fall. Intramedullary fixation performed 04/23/2020. Pt's PMHx includes ESRD on PD, refractory HTN, anxiety/depression, anemia secondary to chronic illness, GERD.    PT Comments    Gradual progression in R LE WBing, gait distance and overall activity tolerance.  Anticipate consistent progression towards all goals in subsequent session as pain is controlled, confidence builds. Will plan to increase gait distance and complete stair training next session to prep for upcoming discharge.    Follow Up Recommendations  Home health PT     Equipment Recommendations  Rolling walker with 5" wheels;3in1 (PT)    Recommendations for Other Services       Precautions / Restrictions Precautions Precautions: Fall Restrictions Weight Bearing Restrictions: Yes RLE Weight Bearing: Weight bearing as tolerated    Mobility  Bed Mobility Overal bed mobility: Needs Assistance Bed Mobility: Sit to Supine     Supine to sit: Min assist Sit to supine: Min assist;Mod assist   General bed mobility comments: assist for R LE management  Transfers Overall transfer level: Needs assistance Equipment used: Rolling walker (2 wheeled) Transfers: Sit to/from Stand Sit to Stand: Min guard         General transfer comment: good carry-over of hand placement  Ambulation/Gait Ambulation/Gait assistance: Min guard Gait Distance (Feet): 30 Feet Assistive device: Rolling walker (2 wheeled)       General Gait Details: gradual progression to reciprocal stepping pattern with improvement in R LE stance time/WBing.  Slow and cautious, but no buckling or LOB.   Stairs             Wheelchair Mobility    Modified Rankin (Stroke Patients Only)        Balance Overall balance assessment: Needs assistance Sitting-balance support: No upper extremity supported;Feet supported Sitting balance-Leahy Scale: Good     Standing balance support: Bilateral upper extremity supported Standing balance-Leahy Scale: Fair                              Cognition Arousal/Alertness: Awake/alert Behavior During Therapy: WFL for tasks assessed/performed Overall Cognitive Status: Within Functional Limits for tasks assessed                                        Exercises Other Exercises Other Exercises: Toilet transfer, ambulatory with RW, cga.  Improving fluidity and confidence with transfers Other Exercises: Toilet transfer, SPT with RW, min assist; sit/stand from [facility] with RW, min assist.  Good carry-over of transfer technique    General Comments        Pertinent Vitals/Pain Pain Assessment: Faces Pain Score: 6  Pain Location: R hip Pain Descriptors / Indicators: Aching;Grimacing;Guarding Pain Intervention(s): Monitored during session;Premedicated before session;Repositioned    Home Living Family/patient expects to be discharged to:: Private residence Living Arrangements: Spouse/significant other Available Help at Discharge: Family;Available 24 hours/day Type of Home: House Home Access: Stairs to enter Entrance Stairs-Rails: None Home Layout: Able to live on main level with bedroom/bathroom;Two level Home Equipment: Cane - single point;Bedside commode;Walker - 2 wheels      Prior Function Level of Independence: Independent      Comments: Indep  without assist device for ADLs, household and community mobilization; works from home for [employer].  Denies additional fall history.   PT Goals (current goals can now be found in the care plan section) Acute Rehab PT Goals Patient Stated Goal: to be home for Christmas PT Goal Formulation: With patient Time For Goal Achievement:  05/08/20 Potential to Achieve Goals: Good Progress towards PT goals: Progressing toward goals    Frequency    BID      PT Plan Current plan remains appropriate    Co-evaluation              AM-PAC PT "6 Clicks" Mobility   Outcome Measure  Help needed turning from your back to your side while in a flat bed without using bedrails?: A Little Help needed moving from lying on your back to sitting on the side of a flat bed without using bedrails?: A Little Help needed moving to and from a bed to a chair (including a wheelchair)?: A Little Help needed standing up from a chair using your arms (e.g., wheelchair or bedside chair)?: A Little Help needed to walk in hospital room?: A Little Help needed climbing 3-5 steps with a railing? : A Little 6 Click Score: 18    End of Session Equipment Utilized During Treatment: Gait belt Activity Tolerance: Patient tolerated treatment well Patient left: in bed;with call bell/phone within reach;with bed alarm set Nurse Communication: Mobility status PT Visit Diagnosis: Muscle weakness (generalized) (M62.81);Difficulty in walking, not elsewhere classified (R26.2);Pain Pain - Right/Left: Right Pain - part of body: Hip     Time: 7253-6644 PT Time Calculation (min) (ACUTE ONLY): 19 min  Charges:  $Gait Training: 8-22 mins $Therapeutic Exercise: 8-22 mins $Therapeutic Activity: 8-22 mins                     Dravin Lance H. Owens Shark, PT, DPT, NCS 04/24/20, 5:16 PM [phone]

## 2020-04-24 NOTE — Progress Notes (Signed)
[facility], [place] 04/24/20  Subjective:   LOS: 2  Patient presented to the ED after a mechanical fall which resulted in right hip fracture.    No leg edema No shortness of breath Able to void without any problem Was able to eat this morning     Objective:  Vital signs in last 24 hours:  Temp:  [97 F (36.1 C)-99.3 F (37.4 C)] 99.3 F (37.4 C) (12/23 1234) Pulse Rate:  [56-73] 65 (12/23 1234) Resp:  [8-18] 16 (12/23 1234) BP: (113-182)/(54-94) 141/71 (12/23 1234) SpO2:  [97 %-100 %] 100 % (12/23 1234)  Weight change:  Filed Weights   04/22/20 1541  Weight: 49.9 kg    Intake/Output:    Intake/Output Summary (Last 24 hours) at 04/24/2020 1349 Last data filed at 04/24/2020 0957 Gross per 24 hour  Intake 200 ml  Output 100 ml  Net 100 ml    Physical Exam: General:  No acute distress, laying in the bed  HEENT  anicteric, moist oral mucous membrane  Pulm/lungs  normal breathing effort, lungs are clear to auscultation  CVS/Heart  regular rhythm, no rub or gallop  Abdomen:   Soft, nontender  Extremities:  No peripheral edema  Neurologic:  Alert, oriented, able to follow commands  Skin:  No acute rashes  PD catheter in place  Basic Metabolic Panel:  Recent Labs  Lab 04/22/20 1644 04/23/20 1048 04/24/20 0554  NA 125* 125* 126*  K 3.6 4.4 4.5  CL 89* 90* 94*  CO2 20* 23 21*  GLUCOSE 99 111* 116*  BUN 50* 56* 58*  CREATININE 5.49* 5.72* 5.82*  CALCIUM 8.1* 8.3* 8.1*     CBC: Recent Labs  Lab 04/22/20 1644 04/23/20 1048 04/24/20 0554  WBC 10.3 11.0* 10.9*  NEUTROABS 8.0*  --   --   HGB 11.9* 12.5 10.7*  HCT 33.7* 35.5* 31.0*  MCV 95.5 96.5 96.0  PLT 246 217 195     No results found for: HEPBSAG, HEPBSAB, HEPBIGM    Microbiology:  Recent Results (from the past 240 hour(s))  Resp Panel by RT-PCR (Flu A&B, Covid) Nasopharyngeal Swab     Status: None   Collection Time: 04/22/20  4:44 PM   Specimen:  Nasopharyngeal Swab; Nasopharyngeal(NP) swabs in vial transport medium  Result Value Ref Range Status   SARS Coronavirus 2 by RT PCR NEGATIVE NEGATIVE Final    Comment: (NOTE) SARS-CoV-2 target nucleic acids are NOT DETECTED.  The SARS-CoV-2 RNA is generally detectable in upper respiratory specimens during the acute phase of infection. The lowest concentration of SARS-CoV-2 viral copies this assay can detect is 138 copies/mL. A negative result does not preclude SARS-Cov-2 infection and should not be used as the sole basis for treatment or other patient management decisions. A negative result may occur with  improper specimen collection/handling, submission of specimen other than nasopharyngeal swab, presence of viral mutation(s) within the areas targeted by this assay, and inadequate number of viral copies(<138 copies/mL). A negative result must be combined with clinical observations, patient history, and epidemiological information. The expected result is Negative.  Fact Sheet for Patients:  EntrepreneurPulse.com.au  Fact Sheet for Healthcare Providers:  IncredibleEmployment.be  This test is no t yet approved or cleared by the [place] FDA and  has been authorized for detection and/or diagnosis of SARS-CoV-2 by FDA under an Emergency Use Authorization (EUA). This EUA will remain  in effect (meaning this test can be used) for the duration of the COVID-19  declaration under Section 564(b)(1) of the Act, 21 U.S.C.section 360bbb-3(b)(1), unless the authorization is terminated  or revoked sooner.       Influenza A by PCR NEGATIVE NEGATIVE Final   Influenza B by PCR NEGATIVE NEGATIVE Final    Comment: (NOTE) The Xpert Xpress SARS-CoV-2/FLU/RSV plus assay is intended as an aid in the diagnosis of influenza from Nasopharyngeal swab specimens and should not be used as a sole basis for treatment. Nasal washings and aspirates are unacceptable for  Xpert Xpress SARS-CoV-2/FLU/RSV testing.  Fact Sheet for Patients: EntrepreneurPulse.com.au  Fact Sheet for Healthcare Providers: IncredibleEmployment.be  This test is not yet approved or cleared by the [place] FDA and has been authorized for detection and/or diagnosis of SARS-CoV-2 by FDA under an Emergency Use Authorization (EUA). This EUA will remain in effect (meaning this test can be used) for the duration of the COVID-19 declaration under Section 564(b)(1) of the Act, 21 U.S.C. section 360bbb-3(b)(1), unless the authorization is terminated or revoked.  Performed at [facility], [address] [place], [place] [address]     Coagulation Studies: No results for input(s): LABPROT, INR in the last 72 hours.  Urinalysis: No results for input(s): COLORURINE, LABSPEC, PHURINE, GLUCOSEU, HGBUR, BILIRUBINUR, KETONESUR, PROTEINUR, UROBILINOGEN, NITRITE, LEUKOCYTESUR in the last 72 hours.  Invalid input(s): APPERANCEUR    Imaging: DG Hip Port Unilat With Pelvis 1V Right  Result Date: 04/23/2020 CLINICAL DATA:  69 year old female status post ORIF. EXAM: DG HIP (WITH OR WITHOUT PELVIS) 1V PORT RIGHT COMPARISON:  Earlier intraoperative fluoroscopic study dated 04/23/2020. FINDINGS: Status post ORIF of right femoral neck fracture. The hardware appears intact. There is no dislocation. The bones are osteopenic. Postsurgical changes in the soft tissues of the right hip and cutaneous staples. IMPRESSION: Status post ORIF of right femoral neck fracture. Electronically Signed   By: Anner Crete M.D.   On: 04/23/2020 19:33   DG HIP OPERATIVE UNILAT W OR W/O PELVIS RIGHT  Result Date: 04/23/2020 CLINICAL DATA:  Intramedullary rod placement EXAM: OPERATIVE RIGHT HIP (WITH PELVIS IF PERFORMED) 2 VIEWS TECHNIQUE: Fluoroscopic spot image(s) were submitted for interpretation post-operatively. COMPARISON:  None. FINDINGS: Five fluoroscopic  images are obtained during the performance of the procedure and are provided for interpretation only. Intramedullary rod with proximal dynamic and distal interlocking screw traverses a right femoral neck fracture. Alignment is anatomic. FLUOROSCOPY TIME:  1 minutes IMPRESSION: 1. ORIF right femoral neck fracture with anatomic alignment. Electronically Signed   By: Randa Ngo M.D.   On: 04/23/2020 19:51     Medications:    sodium chloride 75 mL/hr (04/24/20 0339)   lactated ringers 10 mL/hr at 04/23/20 1641   methocarbamol (ROBAXIN) IV      amLODipine  10 mg Oral Daily   buPROPion  75 mg Oral BID   calcium acetate  667 mg Oral TID WC   carvedilol  25 mg Oral BID WC   cloNIDine  0.2 mg Oral BID   docusate sodium  100 mg Oral BID   enoxaparin (LOVENOX) injection  30 mg Subcutaneous Q24H   feeding supplement (NEPRO CARB STEADY)  237 mL Oral BID BM   losartan  100 mg Oral QHS   multivitamin  1 tablet Oral QHS   pantoprazole  40 mg Oral Daily   senna  1 tablet Oral BID   traMADol  50 mg Oral Q6H   zolpidem  5 mg Oral QHS   acetaminophen, alum & mag hydroxide-simeth, bisacodyl, butalbital-acetaminophen-caffeine, hydrALAZINE, HYDROmorphone (DILAUDID)  injection, LORazepam, magnesium citrate, menthol-cetylpyridinium **OR** phenol, methocarbamol **OR** methocarbamol (ROBAXIN) IV, ondansetron **OR** ondansetron (ZOFRAN) IV, oxyCODONE, polyethylene glycol  Assessment/ Plan:  69 y.o. female with ESRD, hypertension, atrial fibrillation, anemia, history of depression, hospitalization for syncope August 2021 was admitted on 04/22/2020 for  Active Problems:   Closed right hip fracture, initial encounter [facility])  Hip fracture ([place]) [S72.009A] Closed right hip fracture, initial encounter ([place]) [S72.001A]  #. ESRD [name]/4 x 2000 cc/no last fill/EDW 51 kg. She has good residual function Plan for resuming peritoneal dialysis Thursday night  #. Anemia of CKD  Lab Results   Component Value Date   HGB 10.7 (L) 04/24/2020   Low dose EPO for hemoglobin less than 11  #. Secondary hyperparathyroidism of renal origin N 25.81   No results found for: PTH Lab Results  Component Value Date   PHOS 6.1 (H) 11/08/2018   Monitor calcium and phos level during this admission   #.  Right hip closed fracture  Right Intertrochanteric Hip Fracture PROCEDURE:  INTRAMEDULLARY FIXATION FOR RIGHT INTERTROCHANTERIC HIP FRACTURE 04/23/2020 SURGEON:  Th[place]   #chronic hyponatremia Ranges from 125-131   LOS: 2 Tanee Henery Candiss Norse 12/23/20211:49 PM  [facility] [place], [place]

## 2020-04-24 NOTE — Progress Notes (Signed)
[place] [facility] [place] NAME: Amy [name]    MR#:  [id number]  DATE OF BIRTH:  January 04, 1951  SUBJECTIVE:  CHIEF COMPLAINT:   Chief Complaint  Patient presents with   Hip Injury  Reports 6-7 out of 10 pain at the site of surgery.  Inquiring about when she will be able to go home.  Husband at the bedside REVIEW OF SYSTEMS:  Review of Systems  Constitutional: Negative for diaphoresis, fever, malaise/fatigue and weight loss.  HENT: Negative for ear discharge, ear pain, hearing loss, nosebleeds, sore throat and tinnitus.   Eyes: Negative for blurred vision and pain.  Respiratory: Negative for cough, hemoptysis, shortness of breath and wheezing.   Cardiovascular: Negative for chest pain, palpitations, orthopnea and leg swelling.  Gastrointestinal: Negative for abdominal pain, blood in stool, constipation, diarrhea, heartburn, nausea and vomiting.  Genitourinary: Negative for dysuria, frequency and urgency.  Musculoskeletal: Positive for falls and joint pain. Negative for back pain and myalgias.  Skin: Negative for itching and rash.  Neurological: Negative for dizziness, tingling, tremors, focal weakness, seizures, weakness and headaches.  Psychiatric/Behavioral: Negative for depression. The patient is not nervous/anxious.    DRUG ALLERGIES:   Allergies  Allergen Reactions   Cephalosporins Hives and Swelling   Clarithromycin    Lunesta  [Eszopiclone]     Heart racing   Penicillin V     Has patient had a PCN reaction causing immediate rash, facial/tongue/throat swelling, SOB or lightheadedness with hypotension: Yes Has patient had a PCN reaction causing severe rash involving mucus membranes or skin necrosis: No Has patient had a PCN reaction that required hospitalization No Has patient had a PCN reaction occurring within the last 10 years: No If all of the above answers are "NO", then may proceed with Cephalosporin use.   Sulfa Antibiotics     VITALS:  Blood pressure (!) 141/71, pulse 65, temperature 99.3 F (37.4 C), resp. rate 16, height 5\' 4"  (1.626 m), weight 49.9 kg, SpO2 100 %. PHYSICAL EXAMINATION:  Physical Exam HENT:     Head: Normocephalic and atraumatic.  Eyes:     Conjunctiva/sclera: Conjunctivae normal.     Pupils: Pupils are equal, round, and reactive to light.  Neck:     Thyroid: No thyromegaly.     Trachea: No tracheal deviation.  Cardiovascular:     Rate and Rhythm: Normal rate and regular rhythm.     Heart sounds: Normal heart sounds.  Pulmonary:     Effort: Pulmonary effort is normal. No respiratory distress.     Breath sounds: Normal breath sounds. No wheezing.  Chest:     Chest wall: No tenderness.  Abdominal:     General: Bowel sounds are normal. There is no distension.     Palpations: Abdomen is soft.     Tenderness: There is no abdominal tenderness.  Musculoskeletal:     Cervical back: Normal range of motion and neck supple.     Comments: Right hip surgical site dressing in place.  Area looks clean and no signs of infection  Skin:    General: Skin is warm and dry.     Findings: No rash.  Neurological:     Mental Status: She is alert and oriented to person, place, and time.     Cranial Nerves: No cranial nerve deficit.    LABORATORY PANEL:  Female CBC Recent Labs  Lab 04/24/20 0554  WBC 10.9*  HGB 10.7*  HCT  31.0*  PLT 195   ------------------------------------------------------------------------------------------------------------------ Chemistries  Recent Labs  Lab 04/24/20 0554  NA 126*  K 4.5  CL 94*  CO2 21*  GLUCOSE 116*  BUN 58*  CREATININE 5.82*  CALCIUM 8.1*   RADIOLOGY:  DG Hip Port Unilat With Pelvis 1V Right  Result Date: 04/23/2020 CLINICAL DATA:  69 year old female status post ORIF. EXAM: DG HIP (WITH OR WITHOUT PELVIS) 1V PORT RIGHT COMPARISON:  Earlier intraoperative fluoroscopic study dated 04/23/2020. FINDINGS: Status post ORIF of right femoral neck  fracture. The hardware appears intact. There is no dislocation. The bones are osteopenic. Postsurgical changes in the soft tissues of the right hip and cutaneous staples. IMPRESSION: Status post ORIF of right femoral neck fracture. Electronically Signed   By: Anner Crete M.D.   On: 04/23/2020 19:33   DG HIP OPERATIVE UNILAT W OR W/O PELVIS RIGHT  Result Date: 04/23/2020 CLINICAL DATA:  Intramedullary rod placement EXAM: OPERATIVE RIGHT HIP (WITH PELVIS IF PERFORMED) 2 VIEWS TECHNIQUE: Fluoroscopic spot image(s) were submitted for interpretation post-operatively. COMPARISON:  None. FINDINGS: Five fluoroscopic images are obtained during the performance of the procedure and are provided for interpretation only. Intramedullary rod with proximal dynamic and distal interlocking screw traverses a right femoral neck fracture. Alignment is anatomic. FLUOROSCOPY TIME:  1 minutes IMPRESSION: 1. ORIF right femoral neck fracture with anatomic alignment. Electronically Signed   By: Randa Ngo M.D.   On: 04/23/2020 19:51   ASSESSMENT AND PLAN:  69 year old female with a known history of ESRD on PD, refractory hypertension, anxiety/depression, anemia of chronic kidney disease, GERD is admitted for right hip fracture status post mechanical fall.  Right intertrochanteric hip fracture Status post intramedullary fixation on 12/22 Further management per Ortho.  Pain is controlled PT, OT with early ambulation in the hope for discharge tomorrow  Uncontrolled hypertension Improved continue Norvasc, Coreg, clonidine, losartan and as needed hydralazine  Anxiety and depression Continue Wellbutrin and Ativan as needed  ESRD on peritoneal dialysis Further management per nephrology  Chronic hyponatremia Sodium ranging from 125-130 Asymptomatic  Body mass index is 18.88 kg/m.      Status is: Inpatient  Remains inpatient appropriate because:Inpatient level of care appropriate due to severity of  illness   Dispo: The patient is from: Home              Anticipated d/c is to: Home with home health              Anticipated d/c date is: 1 day              Patient currently is not medically stable to d/c.  Will need to work with PT, OT and needs reeval by orthopedic today    DVT prophylaxis:       enoxaparin (LOVENOX) injection 30 mg Start: 04/24/20 1000 SCDs Start: 04/23/20 1951 Place TED hose Start: 04/23/20 1951     Family Communication: Updated husband at bedside   All the records are reviewed and case discussed with Care Management/Social Worker. Management plans discussed with the patient, family and they are in agreement.  CODE STATUS: Full Code  TOTAL TIME TAKING CARE OF THIS PATIENT: 35 minutes.   More than 50% of the time was spent in counseling/coordination of care: YES  POSSIBLE D/C IN 1-2 DAYS, DEPENDING ON CLINICAL CONDITION.   Max Sane M.D on 04/24/2020 at 2:37 PM  [facility]   CC: Primary care physician; [name], MD  Note: This dictation was prepared with  Dragon dictation along with smaller Company secretary. Any transcriptional errors that result from this process are unintentional.

## 2020-04-24 NOTE — Progress Notes (Signed)
PT Cancellation Note  Patient Details Name: WOODROW DULSKI MRN: [id number] DOB: [birth year]   Cancelled Treatment:    Reason Eval/Treat Not Completed:  (Consult received and chart reviewed.  Patient just receiving pain meds and starting wash-up at bedside.  Requests therapy re-attempt at later time.)   Navneet Schmuck H. Owens Shark, PT, DPT, NCS 04/24/20, 9:34 AM [phone]

## 2020-04-25 DIAGNOSIS — Z992 Dependence on renal dialysis: Secondary | ICD-10-CM | POA: Diagnosis not present

## 2020-04-25 DIAGNOSIS — N186 End stage renal disease: Secondary | ICD-10-CM | POA: Diagnosis not present

## 2020-04-25 DIAGNOSIS — N2581 Secondary hyperparathyroidism of renal origin: Secondary | ICD-10-CM | POA: Diagnosis not present

## 2020-04-25 DIAGNOSIS — D509 Iron deficiency anemia, unspecified: Secondary | ICD-10-CM | POA: Diagnosis not present

## 2020-04-25 LAB — CBC
HCT: 30.6 % — ABNORMAL LOW (ref 36.0–46.0)
Hemoglobin: 10.4 g/dL — ABNORMAL LOW (ref 12.0–15.0)
MCH: 33 pg (ref 26.0–34.0)
MCHC: 34 g/dL (ref 30.0–36.0)
MCV: 97.1 fL (ref 80.0–100.0)
Platelets: 190 10*3/uL (ref 150–400)
RBC: 3.15 MIL/uL — ABNORMAL LOW (ref 3.87–5.11)
RDW: 15.5 % (ref 11.5–15.5)
WBC: 9.9 10*3/uL (ref 4.0–10.5)
nRBC: 0 % (ref 0.0–0.2)

## 2020-04-25 LAB — BASIC METABOLIC PANEL
Anion gap: 13 (ref 5–15)
BUN: 53 mg/dL — ABNORMAL HIGH (ref 8–23)
CO2: 21 mmol/L — ABNORMAL LOW (ref 22–32)
Calcium: 8.3 mg/dL — ABNORMAL LOW (ref 8.9–10.3)
Chloride: 89 mmol/L — ABNORMAL LOW (ref 98–111)
Creatinine, Ser: 5.55 mg/dL — ABNORMAL HIGH (ref 0.44–1.00)
GFR, Estimated: 8 mL/min — ABNORMAL LOW (ref >60)
Glucose, Bld: 101 mg/dL — ABNORMAL HIGH (ref 70–99)
Potassium: 4 mmol/L (ref 3.5–5.1)
Sodium: 123 mmol/L — ABNORMAL LOW (ref 135–145)

## 2020-04-25 NOTE — Progress Notes (Signed)
[place] at [place] NAME: Amy [name]    MR#:  [id number]  DATE OF BIRTH:  1951/02/28  SUBJECTIVE:  CHIEF COMPLAINT:   Chief Complaint  Patient presents with  . Hip Injury  Feeling much better.  Sitting in the chair.  Reports pain with activity especially working with therapist. REVIEW OF SYSTEMS:  Review of Systems  Constitutional: Negative for diaphoresis, fever, malaise/fatigue and weight loss.  HENT: Negative for ear discharge, ear pain, hearing loss, nosebleeds, sore throat and tinnitus.   Eyes: Negative for blurred vision and pain.  Respiratory: Negative for cough, hemoptysis, shortness of breath and wheezing.   Cardiovascular: Negative for chest pain, palpitations, orthopnea and leg swelling.  Gastrointestinal: Negative for abdominal pain, blood in stool, constipation, diarrhea, heartburn, nausea and vomiting.  Genitourinary: Negative for dysuria, frequency and urgency.  Musculoskeletal: Positive for falls and joint pain. Negative for back pain and myalgias.  Skin: Negative for itching and rash.  Neurological: Negative for dizziness, tingling, tremors, focal weakness, seizures, weakness and headaches.  Psychiatric/Behavioral: Negative for depression. The patient is not nervous/anxious.    DRUG ALLERGIES:   Allergies  Allergen Reactions  . Cephalosporins Hives and Swelling  . Clarithromycin   . Lunesta  [Eszopiclone]     Heart racing  . Penicillin V     Has patient had a PCN reaction causing immediate rash, facial/tongue/throat swelling, SOB or lightheadedness with hypotension: Yes Has patient had a PCN reaction causing severe rash involving mucus membranes or skin necrosis: No Has patient had a PCN reaction that required hospitalization No Has patient had a PCN reaction occurring within the last 10 years: No If all of the above answers are "NO", then may proceed with Cephalosporin use.  . Sulfa Antibiotics    VITALS:  Blood  pressure 131/72, pulse 66, temperature 98.6 F (37 C), resp. rate 16, height 5\' 4"  (1.626 m), weight 52 kg, SpO2 95 %. PHYSICAL EXAMINATION:  Physical Exam HENT:     Head: Normocephalic and atraumatic.  Eyes:     Conjunctiva/sclera: Conjunctivae normal.     Pupils: Pupils are equal, round, and reactive to light.  Neck:     Thyroid: No thyromegaly.     Trachea: No tracheal deviation.  Cardiovascular:     Rate and Rhythm: Normal rate and regular rhythm.     Heart sounds: Normal heart sounds.  Pulmonary:     Effort: Pulmonary effort is normal. No respiratory distress.     Breath sounds: Normal breath sounds. No wheezing.  Chest:     Chest wall: No tenderness.  Abdominal:     General: Bowel sounds are normal. There is no distension.     Palpations: Abdomen is soft.     Tenderness: There is no abdominal tenderness.  Musculoskeletal:     Cervical back: Normal range of motion and neck supple.     Comments: Right hip surgical site dressing in place.  Area looks clean and no signs of infection  Skin:    General: Skin is warm and dry.     Findings: No rash.  Neurological:     Mental Status: She is alert and oriented to person, place, and time.     Cranial Nerves: No cranial nerve deficit.    LABORATORY PANEL:  Female CBC Recent Labs  Lab 04/25/20 0612  WBC 9.9  HGB 10.4*  HCT 30.6*  PLT 190   ------------------------------------------------------------------------------------------------------------------ Chemistries  Recent Labs  Lab 04/25/20 0612  NA 123*  K 4.0  CL 89*  CO2 21*  GLUCOSE 101*  BUN 53*  CREATININE 5.55*  CALCIUM 8.3*   RADIOLOGY:  No results found. ASSESSMENT AND PLAN:  69 year old female with a known history of ESRD on PD, refractory hypertension, anxiety/depression, anemia of chronic kidney disease, GERD is admitted for right hip fracture status post mechanical fall.  Right intertrochanteric hip fracture Status post intramedullary fixation on  12/22 Further management per Ortho.  Pain is controlled - working with therapist -making appropriate postop progress  Uncontrolled hypertension Improved continue Norvasc, Coreg, clonidine, losartan and as needed hydralazine  Anxiety and depression Continue Wellbutrin and Ativan as needed  ESRD on peritoneal dialysis Further management per nephrology  Chronic hyponatremia Sodium ranging from 125-130 Asymptomatic  Body mass index is 19.68 kg/m.      Status is: Inpatient  Remains inpatient appropriate because:Inpatient level of care appropriate due to severity of illness   Dispo: The patient is from: Home              Anticipated d/c is to: Home with home health              Anticipated d/c date is: 1 day              Patient currently is not medically stable to d/c.  Continue working with therapist with a plan discharge tomorrow    DVT prophylaxis:       enoxaparin (LOVENOX) injection 30 mg Start: 04/24/20 1000 SCDs Start: 04/23/20 1951 Place TED hose Start: 04/23/20 1951     Family Communication: Updated husband at bedside on 12/23   All the records are reviewed and case discussed with Care Management/Social Worker. Management plans discussed with the patient, orthopedics and they are in agreement.  CODE STATUS: Full Code  TOTAL TIME TAKING CARE OF THIS PATIENT: 35 minutes.   More than 50% of the time was spent in counseling/coordination of care: YES  POSSIBLE D/C IN 1 DAYS, DEPENDING ON CLINICAL CONDITION.   Max Sane M.D on 04/25/2020 at 1:50 PM  [facility]   CC: Primary care physician; [name], MD  Note: This dictation was prepared with Dragon dictation along with smaller phrase technology. Any transcriptional errors that result from this process are unintentional.

## 2020-04-25 NOTE — Progress Notes (Signed)
Subjective:  POD #2 s/p intramedullary fixation for right intertrochanteric hip fracture..   Patient reports right hip pain as mild to moderate.  Patient is up out of bed to a chair.  She has just finished walking with physical therapy.  Patient expresses some reservation about going home today.  Her physical therapist states she is doing well.  Objective:   VITALS:   Vitals:   04/24/20 2303 04/25/20 0400 04/25/20 0500 04/25/20 0806  BP: (!) 99/58 138/65  131/72  Pulse: 76 63  66  Resp: 16 17  16   Temp: 98.2 F (36.8 C) 97.8 F (36.6 C)  98.6 F (37 C)  TempSrc:  Oral    SpO2: 97% 98%  95%  Weight:   52 kg   Height:        PHYSICAL EXAM: Right lower extremity: I personally change the patient's dressing today.  The superior dressing had moderate serosanguineous drainage.  The inferior 2 dressings have scant serosanguineous drainage.  There is no erythema ecchymosis or significant swelling around her incisions.  Thigh compartments are soft and compressible. Neurovascular intact Sensation intact distally Intact pulses distally Dorsiflexion/Plantar flexion intact No cellulitis present Compartment soft   LABS  Results for orders placed or performed during the hospital encounter of 04/22/20 (from the past 24 hour(s))  CBC     Status: Abnormal   Collection Time: 04/25/20  6:12 AM  Result Value Ref Range   WBC 9.9 4.0 - 10.5 K/uL   RBC 3.15 (L) 3.87 - 5.11 MIL/uL   Hemoglobin 10.4 (L) 12.0 - 15.0 g/dL   HCT 30.6 (L) 36.0 - 46.0 %   MCV 97.1 80.0 - 100.0 fL   MCH 33.0 26.0 - 34.0 pg   MCHC 34.0 30.0 - 36.0 g/dL   RDW 15.5 11.5 - 15.5 %   Platelets 190 150 - 400 K/uL   nRBC 0.0 0.0 - 0.2 %  Basic metabolic panel     Status: Abnormal   Collection Time: 04/25/20  6:12 AM  Result Value Ref Range   Sodium 123 (L) 135 - 145 mmol/L   Potassium 4.0 3.5 - 5.1 mmol/L   Chloride 89 (L) 98 - 111 mmol/L   CO2 21 (L) 22 - 32 mmol/L   Glucose, Bld 101 (H) 70 - 99 mg/dL   BUN 53 (H)  8 - 23 mg/dL   Creatinine, Ser 5.55 (H) 0.44 - 1.00 mg/dL   Calcium 8.3 (L) 8.9 - 10.3 mg/dL   GFR, Estimated 8 (L) >60 mL/min   Anion gap 13 5 - 15    DG Hip Port Unilat With Pelvis 1V Right  Result Date: 04/23/2020 CLINICAL DATA:  69 year old female status post ORIF. EXAM: DG HIP (WITH OR WITHOUT PELVIS) 1V PORT RIGHT COMPARISON:  Earlier intraoperative fluoroscopic study dated 04/23/2020. FINDINGS: Status post ORIF of right femoral neck fracture. The hardware appears intact. There is no dislocation. The bones are osteopenic. Postsurgical changes in the soft tissues of the right hip and cutaneous staples. IMPRESSION: Status post ORIF of right femoral neck fracture. Electronically Signed   By: Anner Crete M.D.   On: 04/23/2020 19:33   DG HIP OPERATIVE UNILAT W OR W/O PELVIS RIGHT  Result Date: 04/23/2020 CLINICAL DATA:  Intramedullary rod placement EXAM: OPERATIVE RIGHT HIP (WITH PELVIS IF PERFORMED) 2 VIEWS TECHNIQUE: Fluoroscopic spot image(s) were submitted for interpretation post-operatively. COMPARISON:  None. FINDINGS: Five fluoroscopic images are obtained during the performance of the procedure and are provided for interpretation  only. Intramedullary rod with proximal dynamic and distal interlocking screw traverses a right femoral neck fracture. Alignment is anatomic. FLUOROSCOPY TIME:  1 minutes IMPRESSION: 1. ORIF right femoral neck fracture with anatomic alignment. Electronically Signed   By: Randa Ngo M.D.   On: 04/23/2020 19:51    Assessment/Plan: 2 Days Post-Op   Active Problems:   Closed right hip fracture, initial encounter [facility])  Patient is doing well postop from an orthopedic standpoint.  Continue with physical therapy.  Patient may be discharged on enteric-coated aspirin 325 mg p.o. twice daily or Lovenox 40 mg daily for DVT prophylaxis whichever medicine recommends based on her CKD.  Probable discharge tomorrow.    Thornton Park , MD 04/25/2020, 11:01  AM

## 2020-04-25 NOTE — Progress Notes (Signed)
Physical Therapy Treatment Patient Details Name: Amy [name] MRN: [id number] DOB: [birth year] Today's Date: 04/25/2020    History of Present Illness CECELIA GRACIANO is a 69 y.o. female who sustained a right intertrochanteric hip fx following a mechanical fall. Intramedullary fixation performed 04/23/2020. Pt's PMHx includes ESRD on PD, refractory HTN, anxiety/depression, anemia secondary to chronic illness, GERD.    PT Comments    Pt was sitting EOB ready for session upon arriving. She is A and O x 4. Does endorse hip pain but pain did not limit her throughout session. Was able to stand and ambulate without difficulty. Performed ascending/dscending stairs with CGA only. Overall demonstrates safe abilities and is cleared from PT standpoint for safe DC home. Do recommend HHPT to continue to follow and progress towards PLOF.     Follow Up Recommendations  Home health PT     Equipment Recommendations  Rolling walker with 5" wheels;3in1 (PT)    Recommendations for Other Services       Precautions / Restrictions Precautions Precautions: Fall Restrictions Weight Bearing Restrictions: No RLE Weight Bearing: Weight bearing as tolerated    Mobility  Bed Mobility  General bed mobility comments: Pt was sitting EOB upon arriving  Transfers Overall transfer level: Needs assistance Equipment used: Rolling walker (2 wheeled) Transfers: Sit to/from Stand Sit to Stand: Supervision    General transfer comment: supervision for safety with vcs for hand placement. no physical assistance required  Ambulation/Gait Ambulation/Gait assistance: Min guard;Supervision Gait Distance (Feet): 120 Feet Assistive device: Rolling walker (2 wheeled) Gait Pattern/deviations: Step-to pattern;Antalgic;Trunk flexed Gait velocity: decreased   General Gait Details: pt demonstrates safe steady gait kinematics   Stairs Stairs: Yes Stairs assistance: Min guard Stair Management: No rails;Backwards;With  walker Number of Stairs: 2 General stair comments: pt performed ascending/descending stairs 2 x with use of RW and backwards technique       Balance Overall balance assessment: Needs assistance Sitting-balance support: No upper extremity supported;Feet supported Sitting balance-Leahy Scale: Good     Standing balance support: Bilateral upper extremity supported;During functional activity Standing balance-Leahy Scale: Good Standing balance comment: no LOB throughout session         Cognition Arousal/Alertness: Awake/alert Behavior During Therapy: WFL for tasks assessed/performed Overall Cognitive Status: Within Functional Limits for tasks assessed      General Comments: Pt is A and O x 4             Pertinent Vitals/Pain Pain Assessment: 0-10 Pain Score: 6  Pain Location: R hip Pain Descriptors / Indicators: Aching;Grimacing;Guarding Pain Intervention(s): Limited activity within patient's tolerance;Monitored during session;Premedicated before session;Repositioned           PT Goals (current goals can now be found in the care plan section) Acute Rehab PT Goals Patient Stated Goal: to be home for Christmas Progress towards PT goals: Progressing toward goals    Frequency    BID      PT Plan Current plan remains appropriate       AM-PAC PT "6 Clicks" Mobility   Outcome Measure  Help needed turning from your back to your side while in a flat bed without using bedrails?: A Little Help needed moving from lying on your back to sitting on the side of a flat bed without using bedrails?: A Little Help needed moving to and from a bed to a chair (including a wheelchair)?: A Little Help needed standing up from a chair using your arms (e.g., wheelchair or bedside chair)?: A Little Help  needed to walk in hospital room?: A Little Help needed climbing 3-5 steps with a railing? : A Little 6 Click Score: 18    End of Session Equipment Utilized During Treatment: Gait  belt Activity Tolerance: Patient tolerated treatment well Patient left: in chair;with call bell/phone within reach;with chair alarm set;with family/visitor present Nurse Communication: Mobility status PT Visit Diagnosis: Muscle weakness (generalized) (M62.81);Difficulty in walking, not elsewhere classified (R26.2);Pain Pain - Right/Left: Right Pain - part of body: Hip     Time: 1937-9024 PT Time Calculation (min) (ACUTE ONLY): 26 min  Charges:  $Gait Training: 23-37 mins                     Julaine Fusi PTA 04/25/20, 1:24 PM

## 2020-04-25 NOTE — Progress Notes (Signed)
[facility], [place] 04/25/20  Subjective:   LOS: 3  Patient presented to the ED after a mechanical fall which resulted in right hip fracture.    No leg edema No shortness of breath Able to void without any problem Was able to eat breakfast this morning without any problem Completed PD overnight successfully     Objective:  Vital signs in last 24 hours:  Temp:  [97.8 F (36.6 C)-98.6 F (37 C)] 98.6 F (37 C) (12/24 0806) Pulse Rate:  [63-76] 66 (12/24 0806) Resp:  [16-17] 16 (12/24 0806) BP: (99-138)/(58-72) 131/72 (12/24 0806) SpO2:  [95 %-99 %] 95 % (12/24 0806) Weight:  [52 kg] 52 kg (12/24 0500)  Weight change:  Filed Weights   04/22/20 1541 04/25/20 0500  Weight: 49.9 kg 52 kg    Intake/Output:    Intake/Output Summary (Last 24 hours) at 04/25/2020 1402 Last data filed at 04/25/2020 1100 Gross per 24 hour  Intake 240 ml  Output --  Net 240 ml    Physical Exam: General:  No acute distress, laying in the bed  HEENT  anicteric, moist oral mucous membrane  Pulm/lungs  normal breathing effort, lungs are clear to auscultation  CVS/Heart  regular rhythm, no rub or gallop  Abdomen:   Soft, nontender  Extremities:  No peripheral edema  Neurologic:  Alert, oriented, able to follow commands  Skin:  No acute rashes  PD catheter in place  Basic Metabolic Panel:  Recent Labs  Lab 04/22/20 1644 04/23/20 1048 04/24/20 0554 04/25/20 0612  NA 125* 125* 126* 123*  K 3.6 4.4 4.5 4.0  CL 89* 90* 94* 89*  CO2 20* 23 21* 21*  GLUCOSE 99 111* 116* 101*  BUN 50* 56* 58* 53*  CREATININE 5.49* 5.72* 5.82* 5.55*  CALCIUM 8.1* 8.3* 8.1* 8.3*     CBC: Recent Labs  Lab 04/22/20 1644 04/23/20 1048 04/24/20 0554 04/25/20 0612  WBC 10.3 11.0* 10.9* 9.9  NEUTROABS 8.0*  --   --   --   HGB 11.9* 12.5 10.7* 10.4*  HCT 33.7* 35.5* 31.0* 30.6*  MCV 95.5 96.5 96.0 97.1  PLT 246 217 195 190     No results found for: HEPBSAG, HEPBSAB,  HEPBIGM    Microbiology:  Recent Results (from the past 240 hour(s))  Resp Panel by RT-PCR (Flu A&B, Covid) Nasopharyngeal Swab     Status: None   Collection Time: 04/22/20  4:44 PM   Specimen: Nasopharyngeal Swab; Nasopharyngeal(NP) swabs in vial transport medium  Result Value Ref Range Status   SARS Coronavirus 2 by RT PCR NEGATIVE NEGATIVE Final    Comment: (NOTE) SARS-CoV-2 target nucleic acids are NOT DETECTED.  The SARS-CoV-2 RNA is generally detectable in upper respiratory specimens during the acute phase of infection. The lowest concentration of SARS-CoV-2 viral copies this assay can detect is 138 copies/mL. A negative result does not preclude SARS-Cov-2 infection and should not be used as the sole basis for treatment or other patient management decisions. A negative result may occur with  improper specimen collection/handling, submission of specimen other than nasopharyngeal swab, presence of viral mutation(s) within the areas targeted by this assay, and inadequate number of viral copies(<138 copies/mL). A negative result must be combined with clinical observations, patient history, and epidemiological information. The expected result is Negative.  Fact Sheet for Patients:  EntrepreneurPulse.com.au  Fact Sheet for Healthcare Providers:  IncredibleEmployment.be  This test is no t yet approved or cleared by the [place]  FDA and  has been authorized for detection and/or diagnosis of SARS-CoV-2 by FDA under an Emergency Use Authorization (EUA). This EUA will remain  in effect (meaning this test can be used) for the duration of the COVID-19 declaration under Section 564(b)(1) of the Act, 21 U.S.C.section 360bbb-3(b)(1), unless the authorization is terminated  or revoked sooner.       Influenza A by PCR NEGATIVE NEGATIVE Final   Influenza B by PCR NEGATIVE NEGATIVE Final    Comment: (NOTE) The Xpert Xpress SARS-CoV-2/FLU/RSV  plus assay is intended as an aid in the diagnosis of influenza from Nasopharyngeal swab specimens and should not be used as a sole basis for treatment. Nasal washings and aspirates are unacceptable for Xpert Xpress SARS-CoV-2/FLU/RSV testing.  Fact Sheet for Patients: EntrepreneurPulse.com.au  Fact Sheet for Healthcare Providers: IncredibleEmployment.be  This test is not yet approved or cleared by the [place] FDA and has been authorized for detection and/or diagnosis of SARS-CoV-2 by FDA under an Emergency Use Authorization (EUA). This EUA will remain in effect (meaning this test can be used) for the duration of the COVID-19 declaration under Section 564(b)(1) of the Act, 21 U.S.C. section 360bbb-3(b)(1), unless the authorization is terminated or revoked.  Performed at [facility], [address] [place], [place] [address]     Coagulation Studies: No results for input(s): LABPROT, INR in the last 72 hours.  Urinalysis: No results for input(s): COLORURINE, LABSPEC, PHURINE, GLUCOSEU, HGBUR, BILIRUBINUR, KETONESUR, PROTEINUR, UROBILINOGEN, NITRITE, LEUKOCYTESUR in the last 72 hours.  Invalid input(s): APPERANCEUR    Imaging: DG Hip Port Unilat With Pelvis 1V Right  Result Date: 04/23/2020 CLINICAL DATA:  69 year old female status post ORIF. EXAM: DG HIP (WITH OR WITHOUT PELVIS) 1V PORT RIGHT COMPARISON:  Earlier intraoperative fluoroscopic study dated 04/23/2020. FINDINGS: Status post ORIF of right femoral neck fracture. The hardware appears intact. There is no dislocation. The bones are osteopenic. Postsurgical changes in the soft tissues of the right hip and cutaneous staples. IMPRESSION: Status post ORIF of right femoral neck fracture. Electronically Signed   By: Anner Crete M.D.   On: 04/23/2020 19:33   DG HIP OPERATIVE UNILAT W OR W/O PELVIS RIGHT  Result Date: 04/23/2020 CLINICAL DATA:  Intramedullary rod  placement EXAM: OPERATIVE RIGHT HIP (WITH PELVIS IF PERFORMED) 2 VIEWS TECHNIQUE: Fluoroscopic spot image(s) were submitted for interpretation post-operatively. COMPARISON:  None. FINDINGS: Five fluoroscopic images are obtained during the performance of the procedure and are provided for interpretation only. Intramedullary rod with proximal dynamic and distal interlocking screw traverses a right femoral neck fracture. Alignment is anatomic. FLUOROSCOPY TIME:  1 minutes IMPRESSION: 1. ORIF right femoral neck fracture with anatomic alignment. Electronically Signed   By: Randa Ngo M.D.   On: 04/23/2020 19:51     Medications:   . sodium chloride 75 mL/hr (04/24/20 0339)  . lactated ringers 10 mL/hr at 04/23/20 1641  . methocarbamol (ROBAXIN) IV     . amLODipine  10 mg Oral Daily  . buPROPion  75 mg Oral BID  . calcium acetate  667 mg Oral TID WC  . carvedilol  25 mg Oral BID WC  . cloNIDine  0.2 mg Oral BID  . docusate sodium  100 mg Oral BID  . enoxaparin (LOVENOX) injection  30 mg Subcutaneous Q24H  . feeding supplement (NEPRO CARB STEADY)  237 mL Oral BID BM  . gentamicin cream  1 application Topical Daily  . losartan  100 mg Oral QHS  . multivitamin  1 tablet Oral QHS  . pantoprazole  40 mg Oral Daily  . senna  1 tablet Oral BID  . traMADol  50 mg Oral Q6H  . zolpidem  5 mg Oral QHS   acetaminophen, alum & mag hydroxide-simeth, bisacodyl, butalbital-acetaminophen-caffeine, heparin, hydrALAZINE, HYDROmorphone (DILAUDID) injection, LORazepam, magnesium citrate, menthol-cetylpyridinium **OR** phenol, methocarbamol **OR** methocarbamol (ROBAXIN) IV, ondansetron **OR** ondansetron (ZOFRAN) IV, oxyCODONE, polyethylene glycol  Assessment/ Plan:  69 y.o. female with ESRD, hypertension, atrial fibrillation, anemia, history of depression, hospitalization for syncope August 2021 was admitted on 04/22/2020 for  Active Problems:   Closed right hip fracture, initial encounter [facility])  Hip  fracture ([place]) [S72.009A] Closed right hip fracture, initial encounter ([place]) [S72.001A]  #. ESRD [name]/4 x 2000 cc/no last fill/EDW 51 kg. She has good residual function Completed PD on Thursday night. No PD available for tonight because of staff unavailability We will resume peritoneal dialysis on Saturday night if patient is still in the hospital  #. Anemia of CKD  Lab Results  Component Value Date   HGB 10.4 (L) 04/25/2020   Resume Epogen as outpatient  #. Secondary hyperparathyroidism of renal origin N 25.81   No results found for: PTH Lab Results  Component Value Date   PHOS 6.1 (H) 11/08/2018   Monitor calcium and phos level during this admission   #.  Right hip closed fracture  Right Intertrochanteric Hip Fracture PROCEDURE:  INTRAMEDULLARY FIXATION FOR RIGHT INTERTROCHANTERIC HIP FRACTURE 04/23/2020 SURGEON:  Thornton Park   #chronic hyponatremia Ranges from 125-131   LOS: 3 Amy [name] 12/24/20212:02 PM  [facility] [place], [place]

## 2020-04-25 NOTE — Progress Notes (Signed)
Physical Therapy Treatment Patient Details Name: Amy [name] MRN: [id number] DOB: 05/22/1950 Today's Date: 04/25/2020    History of Present Illness Amy [name] is a 69 y.o. female who sustained a right intertrochanteric hip fx following a mechanical fall. Intramedullary fixation performed 04/23/2020. Pt's PMHx includes ESRD on PD, refractory HTN, anxiety/depression, anemia secondary to chronic illness, GERD.    PT Comments    Pt was sitting in recliner upon arriving for pm session. She requested to use BR and not ambulate into halls this session. She was easily able to stand and ambulate to BR, urinate, then return to bed. Did required min assist to progress BLEs. Issued HEP handout and pt perform with assistance. Overall pt is progressing well towards all PT goals. Did perform stairs in AM session however would benefit from spouse being educated on proper performance since she does not have rails. Pt will benefit from HHPT to address deficits while improving pt's independence.     Follow Up Recommendations  Home health PT     Equipment Recommendations  Other (comment) (may need 3 in 1 still)    Recommendations for Other Services       Precautions / Restrictions Precautions Precautions: Fall Restrictions Weight Bearing Restrictions: No RLE Weight Bearing: Weight bearing as tolerated    Mobility  Bed Mobility Overal bed mobility: Needs Assistance Bed Mobility: Sit to Supine       Sit to supine: Min assist   General bed mobility comments: pt was cued for techniques to progress LEs back into bed from short sit EOB  Transfers Overall transfer level: Needs assistance Equipment used: Rolling walker (2 wheeled) Transfers: Sit to/from Stand Sit to Stand: Supervision         General transfer comment: supervision for safety with vcs for hand placement. no physical assistance required  Ambulation/Gait Ambulation/Gait assistance: Supervision Gait Distance (Feet): 25  Feet Assistive device: Rolling walker (2 wheeled) Gait Pattern/deviations: Step-to pattern;Antalgic;Trunk flexed Gait velocity: decreased   General Gait Details: pt demonstrates safe steady gait kinematics. ambulated into bathroom from recliner then around room back to bed.   Stairs Stairs: Yes Stairs assistance: Min guard Stair Management: No rails;Backwards;With walker Number of Stairs: 2 General stair comments: pt performed ascending/descending stairs 2 x with use of RW and backwards technique       Balance Overall balance assessment: Needs assistance Sitting-balance support: No upper extremity supported;Feet supported Sitting balance-Leahy Scale: Good     Standing balance support: Bilateral upper extremity supported;During functional activity Standing balance-Leahy Scale: Good Standing balance comment: no LOB throughout session         Cognition Arousal/Alertness: Awake/alert Behavior During Therapy: WFL for tasks assessed/performed Overall Cognitive Status: Within Functional Limits for tasks assessed        General Comments: Pt is A and O x 4             Pertinent Vitals/Pain Pain Assessment: 0-10 Pain Score: 4  Pain Location: R hip Pain Descriptors / Indicators: Aching;Grimacing;Guarding Pain Intervention(s): Limited activity within patient's tolerance;Monitored during session;Premedicated before session;Repositioned           PT Goals (current goals can now be found in the care plan section) Acute Rehab PT Goals Patient Stated Goal: to be home for Christmas Progress towards PT goals: Progressing toward goals    Frequency    BID      PT Plan Current plan remains appropriate       AM-PAC PT "6 Clicks" Mobility  Outcome Measure  Help needed turning from your back to your side while in a flat bed without using bedrails?: A Little Help needed moving from lying on your back to sitting on the side of a flat bed without using bedrails?: A  Little Help needed moving to and from a bed to a chair (including a wheelchair)?: A Little Help needed standing up from a chair using your arms (e.g., wheelchair or bedside chair)?: A Little Help needed to walk in hospital room?: A Little Help needed climbing 3-5 steps with a railing? : A Little 6 Click Score: 18    End of Session Equipment Utilized During Treatment: Gait belt Activity Tolerance: Patient tolerated treatment well Patient left: in chair;with call bell/phone within reach;with chair alarm set;with family/visitor present Nurse Communication: Mobility status PT Visit Diagnosis: Muscle weakness (generalized) (M62.81);Difficulty in walking, not elsewhere classified (R26.2);Pain Pain - Right/Left: Right Pain - part of body: Hip     Time: 1400-1440 PT Time Calculation (min) (ACUTE ONLY): 40 min  Charges:  $Gait Training: 8-22 mins $Therapeutic Exercise: 8-22 mins $Therapeutic Activity: 8-22 mins                     Julaine Fusi PTA 04/25/20, 4:23 PM

## 2020-04-25 NOTE — Care Management Important Message (Signed)
Important Message  Patient Details  Name: Amy [name] MRN: [id number] Date of Birth: 1951/03/13   Medicare Important Message Given:  Yes     Juliann Pulse A Decklin [place]ton 04/25/2020, 1:03 PM

## 2020-04-25 NOTE — TOC Initial Note (Signed)
Transition of Care [facility]) - Initial/Assessment Note    Patient Details  Name: Amy [name] MRN: [id number] Date of Birth: 29-Jul-1950  Transition of Care [facility]) CM/SW Contact:    Shelbie Ammons, RN Phone Number: 04/25/2020, 9:17 AM  Clinical Narrative:    RNCM met with patient and husband in room. Patient is from home with her husband and is normally independent in all of her ADLs. She is status post surgery for left hip fracture. She normally drives herself to appointments and Dr. Caryn Section is her PCP. She gets her prescriptions from [facility] and denies any problems affording them. Patient is agreeable to having home health set up and does not have a preference as to the agency. She denies need for rolling walker or 3N1 reporting she already has one at home.  RNCM reached out to [place] with [facility] and he will provide home health.            Expected Discharge Plan: [place] Barriers to Discharge: No Barriers Identified   Patient Goals and CMS Choice        Expected Discharge Plan and Services Expected Discharge Plan: [place]       Living arrangements for the past 2 months: [place]: PT,OT [place] Agency: [place] ([place]) Date [place]: 04/25/20 Time [place]: [phone] Representative spoke with at [place]: [place] Arrangements/Services Living arrangements for the past 2 months: [place] Lives with:: Spouse Patient language and need for interpreter reviewed:: Yes Do you feel safe going back to the place where you live?: Yes      Need for Family Participation in Patient Care: Yes (Comment) Care giver support system in place?: Yes (comment)   Criminal Activity/Legal Involvement Pertinent to Current Situation/Hospitalization: No - Comment as needed  Activities of Daily Living Home Assistive Devices/Equipment: None ADL Screening  (condition at time of admission) Patient's cognitive ability adequate to safely complete daily activities?: Yes Is the patient deaf or have difficulty hearing?: No Does the patient have difficulty seeing, even when wearing glasses/contacts?: No Does the patient have difficulty concentrating, remembering, or making decisions?: No Patient able to express need for assistance with ADLs?: No Does the patient have difficulty dressing or bathing?: No Independently performs ADLs?: No Communication: Independent Dressing (OT): Independent Grooming: Independent Feeding: Independent Bathing: Independent Toileting: Independent In/Out Bed: Independent Walks in Home: Independent Does the patient have difficulty walking or climbing stairs?: No Weakness of Legs: None Weakness of Arms/Hands: None  Permission Sought/Granted                  Emotional Assessment Appearance:: Appears stated age Attitude/Demeanor/Rapport: Engaged Affect (typically observed): Calm,Appropriate Orientation: : Oriented to Self,Oriented to Place,Oriented to  Time,Oriented to Situation Alcohol / Substance Use: Not Applicable Psych Involvement: No (comment)  Admission diagnosis:  Hip fracture ([place]) [S72.009A] Closed right hip fracture, initial encounter ([place]) [S72.001A] Patient Active Problem List   Diagnosis Date Noted  . Closed right hip fracture, initial encounter ([place]) 04/22/2020  . Hypotension 12/16/2019  . ESRD on dialysis ([place]) 12/16/2019  . Depression with anxiety 12/16/2019  . Hypomagnesemia 12/16/2019  . Atrial flutter ([place])   . Atrial fibrillation with RVR ([place]) 04/03/2019  . Benign essential HTN 04/03/2019  . HLD (hyperlipidemia)  04/03/2019  . End stage renal disease ([place]) 12/13/2018  . Itching 11/08/2018  . Hyperparathyroidism, secondary renal ([place]) 09/13/2017  . Ground glass opacity present on imaging of lung 04/08/2017  . Osteoarthritis of knee 12/29/2016  . Normocytic anemia 10/14/2016  .  Intractable episodic headache   . Gastritis   . History of adenomatous polyp of colon 09/07/2016  . Iron deficiency anemia 09/05/2016  . Multiple thyroid nodules 07/04/2015  . Right thyroid nodule 06/27/2015  . Allergic rhinitis, seasonal 12/03/2014  . Fever blister 12/03/2014  . Herniated lumbar intervertebral disc 12/03/2014  . Hypokalemia 12/03/2014  . Recurrent sinus infections 12/03/2014  . Insomnia 11/05/2014  . Acquired scoliosis 06/26/2014  . Intervertebral disc stenosis of neural canal of lumbar region 06/20/2014  . Lumbar facet arthropathy 05/21/2014  . Degenerative spondylolisthesis 03/12/2014  . OP (osteoporosis) 08/10/2013  . Idiopathic peripheral neuropathy 03/24/2009  . Edema 12/27/2007  . Headache, tension-type 10/25/2006  . Hypertension 10/24/2006  . Anxiety 05/03/1998  . Esophageal reflux 05/03/1998   PCP:  [name], MD Pharmacy:   [facility] [phone] [address], [address] [place] [address] Phone: [phone] Fax: [phone]  [id number] [place], [address] [place]Phone: [phone]: [phone] [id number] [address]: [phone]: [address]hone: [phone]: [id number] [address]hone: [phone]: [phone]   Social Determinants of Health (SDAlburnettInterventions    Readmission Risk Interventions No flowsheet data found.

## 2020-04-26 DIAGNOSIS — N2581 Secondary hyperparathyroidism of renal origin: Secondary | ICD-10-CM | POA: Diagnosis not present

## 2020-04-26 DIAGNOSIS — D509 Iron deficiency anemia, unspecified: Secondary | ICD-10-CM | POA: Diagnosis not present

## 2020-04-26 DIAGNOSIS — N186 End stage renal disease: Secondary | ICD-10-CM | POA: Diagnosis not present

## 2020-04-26 DIAGNOSIS — Z9889 Other specified postprocedural states: Secondary | ICD-10-CM

## 2020-04-26 DIAGNOSIS — Z992 Dependence on renal dialysis: Secondary | ICD-10-CM | POA: Diagnosis not present

## 2020-04-26 DIAGNOSIS — R1111 Vomiting without nausea: Secondary | ICD-10-CM

## 2020-04-26 LAB — CBC
HCT: 28.5 % — ABNORMAL LOW (ref 36.0–46.0)
Hemoglobin: 9.7 g/dL — ABNORMAL LOW (ref 12.0–15.0)
MCH: 33.2 pg (ref 26.0–34.0)
MCHC: 34 g/dL (ref 30.0–36.0)
MCV: 97.6 fL (ref 80.0–100.0)
Platelets: 168 10*3/uL (ref 150–400)
RBC: 2.92 MIL/uL — ABNORMAL LOW (ref 3.87–5.11)
RDW: 15.3 % (ref 11.5–15.5)
WBC: 8.2 10*3/uL (ref 4.0–10.5)
nRBC: 0 % (ref 0.0–0.2)

## 2020-04-26 MED ORDER — ASPIRIN EC 325 MG PO TBEC: 325.0000 mg | DELAYED_RELEASE_TABLET | Freq: Two times a day (BID) | ORAL | 0 refills | Status: AC

## 2020-04-26 MED ORDER — SENNA-DOCUSATE SODIUM 8.6-50 MG PO TABS: 2.0000 | ORAL_TABLET | Freq: Two times a day (BID) | ORAL | 0 refills | Status: DC | PRN

## 2020-04-26 MED ORDER — TRAMADOL HCL 50 MG PO TABS: 50.0000 mg | ORAL_TABLET | Freq: Two times a day (BID) | ORAL | 0 refills | Status: AC | PRN

## 2020-04-26 NOTE — Progress Notes (Signed)
[facility] [place] NAME: Amy [name]    MR#:  [id number]  DATE OF BIRTH:  05-09-1950  SUBJECTIVE:  CHIEF COMPLAINT:   Chief Complaint  Patient presents with  . Hip Injury  Was planning to have discharged today but then she threw up for discharge has been canceled.  She does not feel comfortable to leave yet. REVIEW OF SYSTEMS:  Review of Systems  Constitutional: Negative for diaphoresis, fever, malaise/fatigue and weight loss.  HENT: Negative for ear discharge, ear pain, hearing loss, nosebleeds, sore throat and tinnitus.   Eyes: Negative for blurred vision and pain.  Respiratory: Negative for cough, hemoptysis, shortness of breath and wheezing.   Cardiovascular: Negative for chest pain, palpitations, orthopnea and leg swelling.  Gastrointestinal: Positive for vomiting. Negative for abdominal pain, blood in stool, constipation, diarrhea, heartburn and nausea.  Genitourinary: Negative for dysuria, frequency and urgency.  Musculoskeletal: Positive for falls and joint pain. Negative for back pain and myalgias.  Skin: Negative for itching and rash.  Neurological: Negative for dizziness, tingling, tremors, focal weakness, seizures, weakness and headaches.  Psychiatric/Behavioral: Negative for depression. The patient is not nervous/anxious.    DRUG ALLERGIES:   Allergies  Allergen Reactions  . Cephalosporins Hives and Swelling  . Clarithromycin   . Lunesta  [Eszopiclone]     Heart racing  . Penicillin V     Has patient had a PCN reaction causing immediate rash, facial/tongue/throat swelling, SOB or lightheadedness with hypotension: Yes Has patient had a PCN reaction causing severe rash involving mucus membranes or skin necrosis: No Has patient had a PCN reaction that required hospitalization No Has patient had a PCN reaction occurring within the last 10 years: No If all of the above answers are "NO", then may proceed with Cephalosporin use.  .  Sulfa Antibiotics    VITALS:  Blood pressure 137/88, pulse 73, temperature 98.6 F (37 C), resp. rate 16, height 5\' 4"  (1.626 m), weight 53.5 kg, SpO2 97 %. PHYSICAL EXAMINATION:  Physical Exam HENT:     Head: Normocephalic and atraumatic.  Eyes:     Conjunctiva/sclera: Conjunctivae normal.     Pupils: Pupils are equal, round, and reactive to light.  Neck:     Thyroid: No thyromegaly.     Trachea: No tracheal deviation.  Cardiovascular:     Rate and Rhythm: Normal rate and regular rhythm.     Heart sounds: Normal heart sounds.  Pulmonary:     Effort: Pulmonary effort is normal. No respiratory distress.     Breath sounds: Normal breath sounds. No wheezing.  Chest:     Chest wall: No tenderness.  Abdominal:     General: Bowel sounds are normal. There is no distension.     Palpations: Abdomen is soft.     Tenderness: There is no abdominal tenderness.  Musculoskeletal:     Cervical back: Normal range of motion and neck supple.     Comments: Right hip surgical site dressing in place.  Area looks clean and no signs of infection  Skin:    General: Skin is warm and dry.     Findings: No rash.  Neurological:     Mental Status: She is alert and oriented to person, place, and time.     Cranial Nerves: No cranial nerve deficit.    LABORATORY PANEL:  Female CBC Recent Labs  Lab 04/26/20 0433  WBC 8.2  HGB 9.7*  HCT 28.5*  PLT 168   ------------------------------------------------------------------------------------------------------------------ Chemistries  Recent Labs  Lab 04/25/20 0612  NA 123*  K 4.0  CL 89*  CO2 21*  GLUCOSE 101*  BUN 53*  CREATININE 5.55*  CALCIUM 8.3*   RADIOLOGY:  No results found. ASSESSMENT AND PLAN:  69 year old female with a known history of ESRD on PD, refractory hypertension, anxiety/depression, anemia of chronic kidney disease, GERD is admitted for right hip fracture status post mechanical fall.  Right intertrochanteric hip  fracture Status post intramedullary fixation on 12/22 Further management per Ortho.  Pain is controlled - working with therapist -making appropriate postop progress  Vomiting Once today.  She does not feel comfortable leaving for home yet today.  We will cancel the discharge and monitor her overnight This could be due to pain medications or medication side effect  Uncontrolled hypertension Improved continue Norvasc, Coreg, clonidine, losartan and as needed hydralazine  Anxiety and depression Continue Wellbutrin and Ativan as needed  ESRD on peritoneal dialysis Further management per nephrology  Chronic hyponatremia Sodium ranging from 125-130 Asymptomatic  Body mass index is 20.25 kg/m.      Status is: Inpatient  Remains inpatient appropriate because:Inpatient level of care appropriate due to severity of illness   Dispo: The patient is from: Home              Anticipated d/c is to: Home with home health              Anticipated d/c date is: 1 day              Patient currently is not medically stable to d/c.  Continue working with therapist with a plan discharge tomorrow    DVT prophylaxis:       enoxaparin (LOVENOX) injection 30 mg Start: 04/24/20 1000 SCDs Start: 04/23/20 1951 Place TED hose Start: 04/23/20 1951     Family Communication: Updated husband at bedside on 12/23   All the records are reviewed and case discussed with Care Management/Social Worker. Management plans discussed with the patient, orthopedics and they are in agreement.  CODE STATUS: Full Code  TOTAL TIME TAKING CARE OF THIS PATIENT: 35 minutes.   More than 50% of the time was spent in counseling/coordination of care: YES  POSSIBLE D/C IN 1 DAYS, DEPENDING ON CLINICAL CONDITION.   Max Sane M.D on 04/26/2020 at 11:55 AM  [facility]   CC: Primary care physician; [name], MD  Note: This dictation was prepared with Dragon dictation along with smaller phrase  technology. Any transcriptional errors that result from this process are unintentional.

## 2020-04-26 NOTE — Plan of Care (Signed)

## 2020-04-26 NOTE — Progress Notes (Signed)
Physical Therapy Treatment Patient Details Name: Amy [name] MRN: [id number] DOB: 1950-10-28 Today's Date: 04/26/2020    History of Present Illness Amy [name] is a 69 y.o. female who sustained a right intertrochanteric hip fx following a mechanical fall. Intramedullary fixation performed 04/23/2020. Pt's PMHx includes ESRD on PD, refractory HTN, anxiety/depression, anemia secondary to chronic illness, GERD.    PT Comments    Patient tolerated treatment well overall and continues to progress towards goals. Continues to demonstrate safe transfers and mobility techniques and is ready to go home from a functional mobility perspective. Ambulated further today at 270 feet. Could use continued practice on the stairs while she is still hospitalized. Patient would benefit from continued skilled physical therapy to address remaining impairments and functional limitations to work towards stated goals and return to PLOF or maximal functional independence.      Follow Up Recommendations  Home health PT     Equipment Recommendations  Other (comment) (may need 3 in 1 still)    Recommendations for Other Services       Precautions / Restrictions Precautions Precautions: Fall Restrictions RLE Weight Bearing: Weight bearing as tolerated    Mobility  Bed Mobility Overal bed mobility: Needs Assistance Bed Mobility: Sit to Supine     Supine to sit: Supervision;HOB elevated Sit to supine: HOB elevated;Min assist   General bed mobility comments: assistance with L LE. Also performes boost up in bed with min A  Transfers Overall transfer level: Needs assistance Equipment used: Rolling walker (2 wheeled) Transfers: Sit to/from Stand Sit to Stand: Supervision         General transfer comment: patient demo good hand placement and safet technique  Ambulation/Gait Ambulation/Gait assistance: Supervision Gait Distance (Feet): 270 Feet Assistive device: Rolling walker (2 wheeled) Gait  Pattern/deviations: Antalgic;Trunk flexed;Step-through pattern;Step-to pattern Gait velocity: decreased   General Gait Details: advanced from step to to step through gait pattern   Stairs     Stair Management: No rails;Backwards;With walker Number of Stairs: 2 General stair comments: pt performed ascending/descending stairs 2 x with use of RW and backwards technique. also preformed 2 steps forwards wtih CGA with B UE on handrails.   Wheelchair Mobility    Modified Rankin (Stroke Patients Only)       Balance Overall balance assessment: Needs assistance Sitting-balance support: No upper extremity supported;Feet supported Sitting balance-Leahy Scale: Good     Standing balance support: Bilateral upper extremity supported;During functional activity Standing balance-Leahy Scale: Good Standing balance comment: no LOB throughout session                            Cognition Arousal/Alertness: Awake/alert Behavior During Therapy: WFL for tasks assessed/performed Overall Cognitive Status: Within Functional Limits for tasks assessed                                 General Comments: Pt is A and O x 4      Exercises Other Exercises Other Exercises: practiced bed mobility, sit <> stand transfer, ambulation, stairs.    General Comments        Pertinent Vitals/Pain Pain Assessment: Faces Pain Score: 5  Pain Location: R hip Pain Descriptors / Indicators: Aching;Grimacing;Guarding Pain Intervention(s): Limited activity within patient's tolerance;Monitored during session;Repositioned    Home Living  Prior Function            PT Goals (current goals can now be found in the care plan section) Acute Rehab PT Goals Patient Stated Goal: to be home for Christmas Progress towards PT goals: Progressing toward goals    Frequency    BID      PT Plan Current plan remains appropriate    Co-evaluation               AM-PAC PT "6 Clicks" Mobility   Outcome Measure  Help needed turning from your back to your side while in a flat bed without using bedrails?: A Little Help needed moving from lying on your back to sitting on the side of a flat bed without using bedrails?: A Little Help needed moving to and from a bed to a chair (including a wheelchair)?: A Little Help needed standing up from a chair using your arms (e.g., wheelchair or bedside chair)?: A Little Help needed to walk in hospital room?: A Little Help needed climbing 3-5 steps with a railing? : A Little 6 Click Score: 18    End of Session Equipment Utilized During Treatment: Gait belt Activity Tolerance: Patient tolerated treatment well Patient left: with call bell/phone within reach;in bed Nurse Communication: Mobility status PT Visit Diagnosis: Muscle weakness (generalized) (M62.81);Difficulty in walking, not elsewhere classified (R26.2);Pain Pain - Right/Left: Right Pain - part of body: Hip     Time: 1500-1530 PT Time Calculation (min) (ACUTE ONLY): 30 min  Charges:  $Gait Training: 8-22 mins $Therapeutic Activity: 8-22 mins                     Everlean Alstrom. Graylon Good, PT, DPT 04/26/20, 3:42 PM

## 2020-04-26 NOTE — Progress Notes (Signed)
PT Cancellation Note  Patient Details Name: Amy [name] MRN: [id number] DOB: Sep 18, 1950   Cancelled Treatment:    Reason Eval/Treat Not Completed: Other (comment) (Patient recenlty given fleet enema and she feels it is about to come out and cannot participate at this time. Will try again at later time/date as appropriate.)   Everlean Alstrom. Graylon Good, PT, DPT 04/26/20, 12:21 PM

## 2020-04-26 NOTE — Progress Notes (Signed)
Nursing reported ongoing Nausea despite BM. She vomited once earlier today. I believe this could be due to narcotics. I've stopped Dilaudid and oxycodone for now.

## 2020-04-26 NOTE — Progress Notes (Signed)
  Subjective:  POD #2 s/p intramedullary fixation for right intertrochanteric hip fracture.    Patient reports right hip pain as mild to moderate.  Patient explains that she just vomited into the trash can in her room when she was getting up to use the bathroom.  She does not feel well today.  Objective:   VITALS:   Vitals:   04/25/20 1951 04/25/20 2333 04/26/20 0354 04/26/20 0500  BP: 139/70 (!) 104/56 137/88   Pulse: 72 72 73   Resp: 15 15 16    Temp: 98.4 F (36.9 C) 97.6 F (36.4 C) 98.6 F (37 C)   TempSrc:      SpO2: 99% 91% 97%   Weight:    53.5 kg  Height:        PHYSICAL EXAM: Right lower extremity Neurovascular intact Sensation intact distally Intact pulses distally Dorsiflexion/Plantar flexion intact Incision: scant drainage No cellulitis present Compartment soft  LABS  Results for orders placed or performed during the hospital encounter of 04/22/20 (from the past 24 hour(s))  CBC     Status: Abnormal   Collection Time: 04/26/20  4:33 AM  Result Value Ref Range   WBC 8.2 4.0 - 10.5 K/uL   RBC 2.92 (L) 3.87 - 5.11 MIL/uL   Hemoglobin 9.7 (L) 12.0 - 15.0 g/dL   HCT 28.5 (L) 36.0 - 46.0 %   MCV 97.6 80.0 - 100.0 fL   MCH 33.2 26.0 - 34.0 pg   MCHC 34.0 30.0 - 36.0 g/dL   RDW 15.3 11.5 - 15.5 %   Platelets 168 150 - 400 K/uL   nRBC 0.0 0.0 - 0.2 %    No results found.  Assessment/Plan: 3 Days Post-Op   Active Problems:   Essential hypertension   Closed right hip fracture, initial encounter (HCC)   S/P ORIF (open reduction internal fixation) fracture  Patient will require another day of hospitalization to monitor her vomiting and low-grade fever of 99.  Patient was encouraged to use her incentive spirometer for presumed atelectasis.  She will continue heparin until discharge at which point she will take enteric-coated aspirin 325 mg p.o. twice daily for DVT prophylaxis at home.  Patient will follow up in our office in 10 to 14 days for staple removal,  wound check and x-ray.  Continue physical therapy.   [place] , MD 04/26/2020, 12:04 PM

## 2020-04-26 NOTE — Progress Notes (Signed)
PT placed on Peritoneal Dialysis as per order at [facility] for 9 hours

## 2020-04-26 NOTE — Progress Notes (Signed)
[facility], [place] 04/26/20  Subjective:   LOS: 4  Patient presented to the ED after a mechanical fall which resulted in right hip fracture.    Underwent intramedullary fixation of right intertrochanteric hip fracture Patient is overall doing well.  No shortness of breath.  Able to eat without nausea or vomiting Reports constipation Patient was scheduled to be discharged today but had an episode of vomiting therefore discharge is being delayed     Objective:  Vital signs in last 24 hours:  Temp:  [97.6 F (36.4 C)-98.6 F (37 C)] 98.6 F (37 C) (12/25 0354) Pulse Rate:  [66-73] 73 (12/25 0354) Resp:  [15-16] 16 (12/25 0354) BP: (104-139)/(56-88) 137/88 (12/25 0354) SpO2:  [91 %-99 %] 97 % (12/25 0354) Weight:  [53.5 kg] 53.5 kg (12/25 0500)  Weight change: 1.5 kg Filed Weights   04/22/20 1541 04/25/20 0500 04/26/20 0500  Weight: 49.9 kg 52 kg 53.5 kg    Intake/Output:    Intake/Output Summary (Last 24 hours) at 04/26/2020 1325 Last data filed at 04/25/2020 1905 Gross per 24 hour  Intake 240 ml  Output --  Net 240 ml    Physical Exam: General:  No acute distress, laying in the bed  HEENT  anicteric, moist oral mucous membrane  Pulm/lungs  normal breathing effort, lungs are clear to auscultation  CVS/Heart  regular rhythm, no rub or gallop  Abdomen:   Soft, nontender  Extremities:  No peripheral edema  Neurologic:  Alert, oriented, able to follow commands  Skin:  No acute rashes  PD catheter in place  Basic Metabolic Panel:  Recent Labs  Lab 04/22/20 1644 04/23/20 1048 04/24/20 0554 04/25/20 0612  NA 125* 125* 126* 123*  K 3.6 4.4 4.5 4.0  CL 89* 90* 94* 89*  CO2 20* 23 21* 21*  GLUCOSE 99 111* 116* 101*  BUN 50* 56* 58* 53*  CREATININE 5.49* 5.72* 5.82* 5.55*  CALCIUM 8.1* 8.3* 8.1* 8.3*     CBC: Recent Labs  Lab 04/22/20 1644 04/23/20 1048 04/24/20 0554 04/25/20 0612 04/26/20 0433  WBC 10.3 11.0* 10.9* 9.9  8.2  NEUTROABS 8.0*  --   --   --   --   HGB 11.9* 12.5 10.7* 10.4* 9.7*  HCT 33.7* 35.5* 31.0* 30.6* 28.5*  MCV 95.5 96.5 96.0 97.1 97.6  PLT 246 217 195 190 168     No results found for: HEPBSAG, HEPBSAB, HEPBIGM    Microbiology:  Recent Results (from the past 240 hour(s))  Resp Panel by RT-PCR (Flu A&B, Covid) Nasopharyngeal Swab     Status: None   Collection Time: 04/22/20  4:44 PM   Specimen: Nasopharyngeal Swab; Nasopharyngeal(NP) swabs in vial transport medium  Result Value Ref Range Status   SARS Coronavirus 2 by RT PCR NEGATIVE NEGATIVE Final    Comment: (NOTE) SARS-CoV-2 target nucleic acids are NOT DETECTED.  The SARS-CoV-2 RNA is generally detectable in upper respiratory specimens during the acute phase of infection. The lowest concentration of SARS-CoV-2 viral copies this assay can detect is 138 copies/mL. A negative result does not preclude SARS-Cov-2 infection and should not be used as the sole basis for treatment or other patient management decisions. A negative result may occur with  improper specimen collection/handling, submission of specimen other than nasopharyngeal swab, presence of viral mutation(s) within the areas targeted by this assay, and inadequate number of viral copies(<138 copies/mL). A negative result must be combined with clinical observations, patient history, and epidemiological information.  The expected result is Negative.  Fact Sheet for Patients:  EntrepreneurPulse.com.au  Fact Sheet for Healthcare Providers:  IncredibleEmployment.be  This test is no t yet approved or cleared by the [place] FDA and  has been authorized for detection and/or diagnosis of SARS-CoV-2 by FDA under an Emergency Use Authorization (EUA). This EUA will remain  in effect (meaning this test can be used) for the duration of the COVID-19 declaration under Section 564(b)(1) of the Act, 21 U.S.C.section 360bbb-3(b)(1),  unless the authorization is terminated  or revoked sooner.       Influenza A by PCR NEGATIVE NEGATIVE Final   Influenza B by PCR NEGATIVE NEGATIVE Final    Comment: (NOTE) The Xpert Xpress SARS-CoV-2/FLU/RSV plus assay is intended as an aid in the diagnosis of influenza from Nasopharyngeal swab specimens and should not be used as a sole basis for treatment. Nasal washings and aspirates are unacceptable for Xpert Xpress SARS-CoV-2/FLU/RSV testing.  Fact Sheet for Patients: EntrepreneurPulse.com.au  Fact Sheet for Healthcare Providers: IncredibleEmployment.be  This test is not yet approved or cleared by the [place] FDA and has been authorized for detection and/or diagnosis of SARS-CoV-2 by FDA under an Emergency Use Authorization (EUA). This EUA will remain in effect (meaning this test can be used) for the duration of the COVID-19 declaration under Section 564(b)(1) of the Act, 21 U.S.C. section 360bbb-3(b)(1), unless the authorization is terminated or revoked.  Performed at [facility], [address] [place],  [address]     Coagulation Studies: No results for input(s): LABPROT, INR in the last 72 hours.  Urinalysis: No results for input(s): COLORURINE, LABSPEC, PHURINE, GLUCOSEU, HGBUR, BILIRUBINUR, KETONESUR, PROTEINUR, UROBILINOGEN, NITRITE, LEUKOCYTESUR in the last 72 hours.  Invalid input(s): APPERANCEUR    Imaging: No results found.   Medications:   . sodium chloride Stopped (04/26/20 0648)  . lactated ringers 10 mL/hr at 04/23/20 1641  . methocarbamol (ROBAXIN) IV     . amLODipine  10 mg Oral Daily  . buPROPion  75 mg Oral BID  . calcium acetate  667 mg Oral TID WC  . carvedilol  25 mg Oral BID WC  . cloNIDine  0.2 mg Oral BID  . docusate sodium  100 mg Oral BID  . enoxaparin (LOVENOX) injection  30 mg Subcutaneous Q24H  . feeding supplement (NEPRO CARB STEADY)  237 mL Oral BID BM  .  gentamicin cream  1 application Topical Daily  . losartan  100 mg Oral QHS  . multivitamin  1 tablet Oral QHS  . pantoprazole  40 mg Oral Daily  . senna  1 tablet Oral BID  . traMADol  50 mg Oral Q6H  . zolpidem  5 mg Oral QHS   acetaminophen, alum & mag hydroxide-simeth, bisacodyl, butalbital-acetaminophen-caffeine, heparin, hydrALAZINE, HYDROmorphone (DILAUDID) injection, LORazepam, magnesium citrate, menthol-cetylpyridinium **OR** phenol, methocarbamol **OR** methocarbamol (ROBAXIN) IV, ondansetron **OR** ondansetron (ZOFRAN) IV, oxyCODONE, polyethylene glycol  Assessment/ Plan:  69 y.o. female with ESRD, hypertension, atrial fibrillation, anemia, history of depression, hospitalization for syncope August 2021 was admitted on 04/22/2020 for  Active Problems:   Essential hypertension   Closed right hip fracture, initial encounter ([place])   S/P ORIF (open reduction internal fixation) fracture  Hip fracture ([place]) [S72.009A] Closed right hip fracture, initial encounter ([place]) [S72.001A]  #. ESRD [name]/4 x 2000 cc/no last fill/EDW 51 kg. She has good residual function Completed PD on Thursday night. Resume peritoneal dialysis tonight  #. Anemia of CKD  Lab Results  Component Value  Date   HGB 9.7 (L) 04/26/2020   Resume Epogen as outpatient  #. Secondary hyperparathyroidism of renal origin N 25.81   No results found for: PTH Lab Results  Component Value Date   PHOS 6.1 (H) 11/08/2018   Monitor calcium and phos level during this admission   #.  Right hip closed fracture  Right Intertrochanteric Hip Fracture PROCEDURE:  INTRAMEDULLARY FIXATION FOR RIGHT INTERTROCHANTERIC HIP FRACTURE 04/23/2020 SURGEON:  Thornton Park   #chronic hyponatremia Ranges from 125-131   LOS: 4 Daishawn Lauf 12/25/20211:25 PM  [facility] [place], [place]

## 2020-04-26 NOTE — Discharge Instructions (Signed)
Hip Rehabilitation in the Home After hip surgery, it is important to follow instructions from your health care provider about rehabilitation (rehab). It is important to design a program that is safe and effective for you. Your health care provider and rehab therapist will work with you to meet your specific abilities and needs. What are the benefits? Hip rehab can help to:  Strengthen your hip.  Improve the flexibility and movement (range of motion) of your hip joint.  Reduce swelling.  Improve blood flow and prevent blood clots. How to do exercises at home   Continue exercises at home that your health care provider or rehab therapist instructed you to do in the hospital.  Before you exercise: ? Take pain medicines, if told by your health care provider. Do not take the medicine if it makes you feel dizzy or sleepy. ? Do a warm-up activity, such as gentle walking, as told by your health care provider. This warms up your muscles and helps prevent injury.  While doing exercises: ? When standing, make sure you are near something sturdy that you can hold onto for balance, such as a heavy chair or a wall. ? Do exercises exactly as told by your health care provider and adjust them as directed. ? As you are recovering, choose an exercise pace that is comfortable for you, and gradually work up to your goal. ? Follow activity restrictions as told by your health care provider. This may vary depending on the type of hip surgery you had. For example, your health care provider may instruct you to:  Not bring your knees higher than your hips.  Not cross your legs.  Not twist while lying down or standing.  Avoid rotating the toes of your affected leg inward. Keep your toes pointing straight ahead.  Do not exercise in a pool (aquatic therapy) until your incision from surgery is healed and your health care provider says that you can. Follow these instructions at home: Activity  Do not use your  affected leg to support your body weight until your health care provider says that you can. Use crutches or a walker as told by your health care provider.  Ask your health care provider what activities are safe for you during recovery, and ask what activities you need to avoid.  Avoid sitting for a long time without moving. Get up to take short walks every 1-2 hours. Ask for help if you feel weak or unsteady. Managing pain, stiffness, and swelling   Put ice on affected areas after you exercise, or as needed. Icing can help to relieve joint pain and swelling. ? Put ice in a plastic bag that you were given. ? Place a towel between your skin and the bag. ? Leave the ice on for 20 minutes, 2-3 times a day.  If directed, apply heat to affected areas before you exercise, or as needed. Heat can reduce muscle and joint stiffness. Use the heat source that your health care provider recommends, such as a moist heat pack or a heating pad. ? Place a towel between your skin and the heat source. ? Leave the heat on for 20-30 minutes. ? Remove the heat if your skin turns bright red. This is especially important if you are unable to feel pain, heat, or cold. You may have a greater risk of getting burned.  Wear compression stockings as told by your health care provider. These stockings help to prevent blood clots and reduce swelling in your legs.  Raise (elevate) your legs while sitting or lying down. Preventing falls  Keep your home well-lit and clutter-free, especially in walkways and stairways. Keep floors dry and use non-skid mats.  Remove tripping hazards from floors, such as throw rugs and cords.  Install grab bars in bathrooms, and put night-lights in your bedroom and bathroom.  Wear closed-toe shoes that fit well and support your feet. Wear shoes that have rubber soles or low heels.  Talk with your health care provider about the over-the-counter and prescription medicines that you are taking.  Some medicines may increase your risk of falling. General recommendations  Teach your family about your condition and how they can participate in your recovery. Try bringing your family members with you to a physical therapy session.  Take over-the-counter and prescription medicines only as told by your health care provider.  Keep all follow-up visits as told by your health care provider and rehab therapist. This is important. Questions to ask your health care provider  What exercises are safe for me to do?  How often should I do the exercises?  How can I manage pain during exercise?  What other activities are safe for me to do? Contact a health care provider if:  You have questions about how to do exercises correctly.  You have hip or groin pain that does not go away after resting and taking pain medicine.  Your artificial hip (prosthesis) feels loose.  You are not able to do exercises. Get help right away if:  You fall.  Your incision from surgery breaks open.  Your affected leg is shorter than the other leg, and this is a new problem. Summary  Hip rehab can help to strengthen your hip and improve the flexibility and movement of your hip joint.  Continue exercises at home that your health care provider or physical therapist instructed you to do in the hospital.  Contact your health care provider if you have questions about how to do exercises correctly. This information is not intended to replace advice given to you by your health care provider. Make sure you discuss any questions you have with your health care provider. Document Revised: 04/04/2017 Document Reviewed: 04/04/2017 Elsevier Patient Education  [place].

## 2020-04-27 DIAGNOSIS — Z992 Dependence on renal dialysis: Secondary | ICD-10-CM | POA: Diagnosis not present

## 2020-04-27 DIAGNOSIS — R112 Nausea with vomiting, unspecified: Secondary | ICD-10-CM

## 2020-04-27 DIAGNOSIS — D509 Iron deficiency anemia, unspecified: Secondary | ICD-10-CM | POA: Diagnosis not present

## 2020-04-27 DIAGNOSIS — N186 End stage renal disease: Secondary | ICD-10-CM | POA: Diagnosis not present

## 2020-04-27 DIAGNOSIS — Z8781 Personal history of (healed) traumatic fracture: Secondary | ICD-10-CM

## 2020-04-27 DIAGNOSIS — N2581 Secondary hyperparathyroidism of renal origin: Secondary | ICD-10-CM | POA: Diagnosis not present

## 2020-04-27 DIAGNOSIS — R509 Fever, unspecified: Secondary | ICD-10-CM

## 2020-04-27 DIAGNOSIS — Z9889 Other specified postprocedural states: Secondary | ICD-10-CM

## 2020-04-27 LAB — HEPATITIS B SURFACE ANTIGEN: Hepatitis B Surface Ag: NONREACTIVE

## 2020-04-27 NOTE — Progress Notes (Signed)
Physical Therapy Treatment Patient Details Name: Amy [name] MRN: [id number] DOB: 09-Jul-1950 Today's Date: 04/27/2020    History of Present Illness Amy [name] is a 69 y.o. female who sustained a right intertrochanteric hip fx following a mechanical fall. Intramedullary fixation performed 04/23/2020. Pt's PMHx includes ESRD on PD, refractory HTN, anxiety/depression, anemia secondary to chronic illness, GERD.    PT Comments    OOB, to commode, walked to/from PT gym and competed stair training with husband and min guard/assist +1. Overall progressing well.  No LOB or buckling.  No further questions or concerns.  Follow Up Recommendations  Home health PT     Equipment Recommendations  Other (comment) (may need 3 in 1 still)    Recommendations for Other Services       Precautions / Restrictions Precautions Precautions: Fall Restrictions Weight Bearing Restrictions: No RLE Weight Bearing: Weight bearing as tolerated    Mobility  Bed Mobility Overal bed mobility: Needs Assistance Bed Mobility: Supine to Sit     Supine to sit: Min assist        Transfers Overall transfer level: Needs assistance Equipment used: Rolling walker (2 wheeled) Transfers: Sit to/from Stand Sit to Stand: Min guard;Supervision            Ambulation/Gait Ambulation/Gait assistance: Counsellor (Feet): 200 Feet Assistive device: Rolling walker (2 wheeled) Gait Pattern/deviations: Antalgic;Trunk flexed;Step-through pattern;Step-to pattern Gait velocity: decreased   General Gait Details: advanced from step to to step through gait pattern   Stairs Stairs: Yes Stairs assistance: Min guard;Min assist Stair Management: No rails;Backwards;With walker Number of Stairs: 2 General stair comments: husband in for training   Wheelchair Mobility    Modified Rankin (Stroke Patients Only)       Balance Overall balance assessment: Needs assistance Sitting-balance support: No  upper extremity supported;Feet supported Sitting balance-Leahy Scale: Good     Standing balance support: Bilateral upper extremity supported;During functional activity Standing balance-Leahy Scale: Good Standing balance comment: no LOB throughout session                            Cognition Arousal/Alertness: Awake/alert Behavior During Therapy: WFL for tasks assessed/performed Overall Cognitive Status: Within Functional Limits for tasks assessed                                        Exercises      General Comments        Pertinent Vitals/Pain Pain Assessment: Faces Faces Pain Scale: Hurts little more Pain Location: R hip Pain Descriptors / Indicators: Aching;Grimacing;Guarding Pain Intervention(s): Limited activity within patient's tolerance;Monitored during session;Repositioned    Home Living                      Prior Function            PT Goals (current goals can now be found in the care plan section) Progress towards PT goals: Progressing toward goals    Frequency    BID      PT Plan Current plan remains appropriate    Co-evaluation              AM-PAC PT "6 Clicks" Mobility   Outcome Measure  Help needed turning from your back to your side while in a flat bed without using bedrails?: None Help needed moving from  lying on your back to sitting on the side of a flat bed without using bedrails?: None Help needed moving to and from a bed to a chair (including a wheelchair)?: A Little Help needed standing up from a chair using your arms (e.g., wheelchair or bedside chair)?: A Little Help needed to walk in hospital room?: A Little Help needed climbing 3-5 steps with a railing? : A Little 6 Click Score: 20    End of Session Equipment Utilized During Treatment: Gait belt Activity Tolerance: Patient tolerated treatment well Patient left: with call bell/phone within reach;in bed Nurse Communication: Mobility  status PT Visit Diagnosis: Muscle weakness (generalized) (M62.81);Difficulty in walking, not elsewhere classified (R26.2);Pain Pain - Right/Left: Right Pain - part of body: Hip     Time: 7622-6333 PT Time Calculation (min) (ACUTE ONLY): 23 min  Charges:  $Gait Training: 8-22 mins $Therapeutic Activity: 8-22 mins                    Chesley Noon, PTA 04/27/20, 10:35 AM

## 2020-04-27 NOTE — Progress Notes (Signed)
[facility], [place] 04/27/20  Subjective:   LOS: 5  Patient presented to the ED after a mechanical fall which resulted in right hip fracture.    Underwent intramedullary fixation of right intertrochanteric hip fracture Patient is overall doing well.  No shortness of breath.   Patient was scheduled to be discharged yesterday but had an episode of vomiting Today, she was able to tolerate her breakfast     Objective:  Vital signs in last 24 hours:  Temp:  [98.6 F (37 C)-99.6 F (37.6 C)] 98.7 F (37.1 C) (12/26 1234) Pulse Rate:  [67-77] 67 (12/26 1234) Resp:  [16-20] 20 (12/26 1234) BP: (123-146)/(57-76) 135/57 (12/26 1234) SpO2:  [94 %-99 %] 99 % (12/26 1234)  Weight change:  Filed Weights   04/22/20 1541 04/25/20 0500 04/26/20 0500  Weight: 49.9 kg 52 kg 53.5 kg    Intake/Output:    Intake/Output Summary (Last 24 hours) at 04/27/2020 1315 Last data filed at 04/27/2020 1009 Gross per 24 hour  Intake 240 ml  Output 0 ml  Net 240 ml    Physical Exam: General:  No acute distress, laying in the bed  HEENT  anicteric, moist oral mucous membrane  Pulm/lungs  normal breathing effort, lungs are clear to auscultation  CVS/Heart  regular rhythm, no rub or gallop  Abdomen:   Soft, nontender  Extremities:  No peripheral edema  Neurologic:  Alert, oriented, able to follow commands  Skin:  No acute rashes  PD catheter in place  Basic Metabolic Panel:  Recent Labs  Lab 04/22/20 1644 04/23/20 1048 04/24/20 0554 04/25/20 0612  NA 125* 125* 126* 123*  K 3.6 4.4 4.5 4.0  CL 89* 90* 94* 89*  CO2 20* 23 21* 21*  GLUCOSE 99 111* 116* 101*  BUN 50* 56* 58* 53*  CREATININE 5.49* 5.72* 5.82* 5.55*  CALCIUM 8.1* 8.3* 8.1* 8.3*     CBC: Recent Labs  Lab 04/22/20 1644 04/23/20 1048 04/24/20 0554 04/25/20 0612 04/26/20 0433  WBC 10.3 11.0* 10.9* 9.9 8.2  NEUTROABS 8.0*  --   --   --   --   HGB 11.9* 12.5 10.7* 10.4* 9.7*  HCT 33.7*  35.5* 31.0* 30.6* 28.5*  MCV 95.5 96.5 96.0 97.1 97.6  PLT 246 217 195 190 168     No results found for: HEPBSAG, HEPBSAB, HEPBIGM    Microbiology:  Recent Results (from the past 240 hour(s))  Resp Panel by RT-PCR (Flu A&B, Covid) Nasopharyngeal Swab     Status: None   Collection Time: 04/22/20  4:44 PM   Specimen: Nasopharyngeal Swab; Nasopharyngeal(NP) swabs in vial transport medium  Result Value Ref Range Status   SARS Coronavirus 2 by RT PCR NEGATIVE NEGATIVE Final    Comment: (NOTE) SARS-CoV-2 target nucleic acids are NOT DETECTED.  The SARS-CoV-2 RNA is generally detectable in upper respiratory specimens during the acute phase of infection. The lowest concentration of SARS-CoV-2 viral copies this assay can detect is 138 copies/mL. A negative result does not preclude SARS-Cov-2 infection and should not be used as the sole basis for treatment or other patient management decisions. A negative result may occur with  improper specimen collection/handling, submission of specimen other than nasopharyngeal swab, presence of viral mutation(s) within the areas targeted by this assay, and inadequate number of viral copies(<138 copies/mL). A negative result must be combined with clinical observations, patient history, and epidemiological information. The expected result is Negative.  Fact Sheet for Patients:  EntrepreneurPulse.com.au  Fact Sheet for Healthcare Providers:  IncredibleEmployment.be  This test is no t yet approved or cleared by the [place] FDA and  has been authorized for detection and/or diagnosis of SARS-CoV-2 by FDA under an Emergency Use Authorization (EUA). This EUA will remain  in effect (meaning this test can be used) for the duration of the COVID-19 declaration under Section 564(b)(1) of the Act, 21 U.S.C.section 360bbb-3(b)(1), unless the authorization is terminated  or revoked sooner.       Influenza A by PCR  NEGATIVE NEGATIVE Final   Influenza B by PCR NEGATIVE NEGATIVE Final    Comment: (NOTE) The Xpert Xpress SARS-CoV-2/FLU/RSV plus assay is intended as an aid in the diagnosis of influenza from Nasopharyngeal swab specimens and should not be used as a sole basis for treatment. Nasal washings and aspirates are unacceptable for Xpert Xpress SARS-CoV-2/FLU/RSV testing.  Fact Sheet for Patients: EntrepreneurPulse.com.au  Fact Sheet for Healthcare Providers: IncredibleEmployment.be  This test is not yet approved or cleared by the [place] FDA and has been authorized for detection and/or diagnosis of SARS-CoV-2 by FDA under an Emergency Use Authorization (EUA). This EUA will remain in effect (meaning this test can be used) for the duration of the COVID-19 declaration under Section 564(b)(1) of the Act, 21 U.S.C. section 360bbb-3(b)(1), unless the authorization is terminated or revoked.  Performed at [facility], [address] [place], [place] [address]     Coagulation Studies: No results for input(s): LABPROT, INR in the last 72 hours.  Urinalysis: No results for input(s): COLORURINE, LABSPEC, PHURINE, GLUCOSEU, HGBUR, BILIRUBINUR, KETONESUR, PROTEINUR, UROBILINOGEN, NITRITE, LEUKOCYTESUR in the last 72 hours.  Invalid input(s): APPERANCEUR    Imaging: No results found.   Medications:   . sodium chloride Stopped (04/26/20 0648)  . lactated ringers 10 mL/hr at 04/23/20 1641  . methocarbamol (ROBAXIN) IV     . amLODipine  10 mg Oral Daily  . buPROPion  75 mg Oral BID  . calcium acetate  667 mg Oral TID WC  . carvedilol  25 mg Oral BID WC  . cloNIDine  0.2 mg Oral BID  . docusate sodium  100 mg Oral BID  . enoxaparin (LOVENOX) injection  30 mg Subcutaneous Q24H  . feeding supplement (NEPRO CARB STEADY)  237 mL Oral BID BM  . gentamicin cream  1 application Topical Daily  . losartan  100 mg Oral QHS  . multivitamin  1  tablet Oral QHS  . pantoprazole  40 mg Oral Daily  . senna  1 tablet Oral BID  . traMADol  50 mg Oral Q6H  . zolpidem  5 mg Oral QHS   acetaminophen, alum & mag hydroxide-simeth, bisacodyl, butalbital-acetaminophen-caffeine, heparin, hydrALAZINE, LORazepam, magnesium citrate, menthol-cetylpyridinium **OR** phenol, methocarbamol **OR** methocarbamol (ROBAXIN) IV, ondansetron **OR** ondansetron (ZOFRAN) IV, polyethylene glycol  Assessment/ Plan:  69 y.o. female with ESRD, hypertension, atrial fibrillation, anemia, history of depression, hospitalization for syncope August 2021 was admitted on 04/22/2020 for  Active Problems:   Essential hypertension   Closed right hip fracture, initial encounter ([place])   S/P ORIF (open reduction internal fixation) fracture  Hip fracture ([place]) [S72.009A] Closed right hip fracture, initial encounter ([place]) [S72.001A]  #. ESRD [name]/4 x 2000 cc/no last fill/EDW 51 kg. She has good residual function Completed PD on Thursday night, Saturday night. Hold PD tonight.  May resume at home starting tomorrow  #. Anemia of CKD  Lab Results  Component Value Date   HGB 9.7 (L) 04/26/2020  Resume Epogen as outpatient  #. Secondary hyperparathyroidism of renal origin N 25.81   No results found for: PTH Lab Results  Component Value Date   PHOS 6.1 (H) 11/08/2018   Monitor calcium and phos level during this admission   #.  Right hip closed fracture  Right Intertrochanteric Hip Fracture PROCEDURE:  INTRAMEDULLARY FIXATION FOR RIGHT INTERTROCHANTERIC HIP FRACTURE 04/23/2020 SURGEON:  Thornton Park   #chronic hyponatremia Ranges from 125-131   LOS: [place] 12/26/20211:15 PM  [facility] [place], [place]

## 2020-04-27 NOTE — Progress Notes (Signed)
  Subjective:  POD #4 s/p intramedullary fixation for right intertrochanteric hip fracture.   Patient reports right hip pain as mild to moderate.  Patient was up walking around the nurses station this morning.  Her husband is in the room.  Patient is up out of bed to a chair.  She is tolerating p.o. diet.  She still has nausea but no vomiting today.  I saw the patient with Dr. Manuella Ghazi this morning.  Objective:   VITALS:   Vitals:   04/26/20 2108 04/27/20 0001 04/27/20 0517 04/27/20 0742  BP: 123/65 126/68 135/76 133/71  Pulse: 72 77 71 77  Resp: 16 16 16    Temp: 99.6 F (37.6 C) 99.1 F (37.3 C) 99.5 F (37.5 C) 99.2 F (37.3 C)  TempSrc: Oral Oral Oral Oral  SpO2: 95% 94% 96% 96%  Weight:      Height:        PHYSICAL EXAM: Right lower extremity: I personally change the patient's dressing today.  Her proximal honeycomb dressing had mild serosanguineous drainage.  There is no active drainage from the incision. Neurovascular intact Sensation intact distally Intact pulses distally Dorsiflexion/Plantar flexion intact Incision: no drainage No cellulitis present Compartment soft  LABS  No results found for this or any previous visit (from the past 24 hour(s)).  No results found.  Assessment/Plan: 4 Days Post-Op   Active Problems:   Essential hypertension   Closed right hip fracture, initial encounter (HCC)   S/P ORIF (open reduction internal fixation) fracture  Continue with physical therapy.  Continue current pain management.  Patient is cleared for discharge from an orthopedic standpoint.  She will follow-up with me in approximately 10 days in the office for wound check, staple removal and x-ray.  Patient is weightbearing as tolerated.  She will be discharged on enteric-coated aspirin 325 mg p.o. twice daily.    Thornton Park , MD 04/27/2020, 9:29 AM

## 2020-04-27 NOTE — Progress Notes (Signed)
[place] [facility] [place] NAME: Amy [name]    MR#:  [id number]  DATE OF BIRTH:  1950/08/10  SUBJECTIVE:  CHIEF COMPLAINT:   Chief Complaint  Patient presents with  . Hip Injury  Patient still not feeling comfortable to go home yet.  Reports low-grade fever up to 99.6 and feeling nauseous.  She is requesting at least 1 more night..  Husband at bedside.  Dr. Mack Guise was at bedside with me.  Patient is sitting in the chair getting ready to eat her breakfast REVIEW OF SYSTEMS:  Review of Systems  Constitutional: Negative for diaphoresis, fever, malaise/fatigue and weight loss.  HENT: Negative for ear discharge, ear pain, hearing loss, nosebleeds, sore throat and tinnitus.   Eyes: Negative for blurred vision and pain.  Respiratory: Negative for cough, hemoptysis, shortness of breath and wheezing.   Cardiovascular: Negative for chest pain, palpitations, orthopnea and leg swelling.  Gastrointestinal: Negative for abdominal pain, blood in stool, constipation, diarrhea, heartburn and nausea.  Genitourinary: Negative for dysuria, frequency and urgency.  Musculoskeletal: Positive for joint pain. Negative for back pain and myalgias.  Skin: Negative for itching and rash.  Neurological: Negative for dizziness, tingling, tremors, focal weakness, seizures, weakness and headaches.  Psychiatric/Behavioral: Negative for depression. The patient is not nervous/anxious.    DRUG ALLERGIES:   Allergies  Allergen Reactions  . Cephalosporins Hives and Swelling  . Clarithromycin   . Lunesta  [Eszopiclone]     Heart racing  . Penicillin V     Has patient had a PCN reaction causing immediate rash, facial/tongue/throat swelling, SOB or lightheadedness with hypotension: Yes Has patient had a PCN reaction causing severe rash involving mucus membranes or skin necrosis: No Has patient had a PCN reaction that required hospitalization No Has patient had a PCN reaction occurring  within the last 10 years: No If all of the above answers are "NO", then may proceed with Cephalosporin use.  . Sulfa Antibiotics    VITALS:  Blood pressure (!) 135/57, pulse 67, temperature 98.7 F (37.1 C), temperature source Oral, resp. rate 20, height 5\' 4"  (1.626 m), weight 53.5 kg, SpO2 99 %. PHYSICAL EXAMINATION:  Physical Exam HENT:     Head: Normocephalic and atraumatic.  Eyes:     Conjunctiva/sclera: Conjunctivae normal.     Pupils: Pupils are equal, round, and reactive to light.  Neck:     Thyroid: No thyromegaly.     Trachea: No tracheal deviation.  Cardiovascular:     Rate and Rhythm: Normal rate and regular rhythm.     Heart sounds: Normal heart sounds.  Pulmonary:     Effort: Pulmonary effort is normal. No respiratory distress.     Breath sounds: Normal breath sounds. No wheezing.  Chest:     Chest wall: No tenderness.  Abdominal:     General: Bowel sounds are normal. There is no distension.     Palpations: Abdomen is soft.     Tenderness: There is no abdominal tenderness.  Musculoskeletal:     Cervical back: Normal range of motion and neck supple.     Comments: Right hip surgical site dressing in place.  Area looks clean and no signs of infection  Skin:    General: Skin is warm and dry.     Findings: No rash.  Neurological:     Mental Status: She is alert and oriented to person, place, and time.     Cranial Nerves:  No cranial nerve deficit.    LABORATORY PANEL:  Female CBC Recent Labs  Lab 04/26/20 0433  WBC 8.2  HGB 9.7*  HCT 28.5*  PLT 168   ------------------------------------------------------------------------------------------------------------------ Chemistries  Recent Labs  Lab 04/25/20 0612  NA 123*  K 4.0  CL 89*  CO2 21*  GLUCOSE 101*  BUN 53*  CREATININE 5.55*  CALCIUM 8.3*   RADIOLOGY:  No results found. ASSESSMENT AND PLAN:  69 year old female with a known history of ESRD on PD, refractory hypertension, anxiety/depression,  anemia of chronic kidney disease, GERD is admitted for right hip fracture status post mechanical fall.  Right intertrochanteric hip fracture Status post intramedullary fixation on 12/22 Further management per Ortho.  Pain is controlled -I have stopped Dilaudid and oxycodone yesterday which seemed to help with her nausea - working with therapist -making appropriate postop progress  Vomiting None today.  Likely from narcotics which we have stopped yesterday  Uncontrolled hypertension Improved continue Norvasc, Coreg, clonidine, losartan and as needed hydralazine  Anxiety and depression Continue Wellbutrin and Ativan as needed  ESRD on peritoneal dialysis Further management per nephrology  Chronic hyponatremia Sodium ranging from 125-130 Asymptomatic  Low-grade fever Could be from thrombophlebitis on right elbow area IV which has been removed yesterday.  I have requested her to try hot compresses.  I have also encouraged her to use incentive spirometry as atelectasis can also give her low-grade fever.  Body mass index is 20.25 kg/m.      Status is: Inpatient  Remains inpatient appropriate because:Inpatient level of care appropriate due to severity of illness   Dispo: The patient is from: Home              Anticipated d/c is to: Home with home health              Anticipated d/c date is: 1 day              Patient currently is not medically stable to d/c.  Continue working with therapist with a plan discharge hopefully by tomorrow    DVT prophylaxis:       enoxaparin (LOVENOX) injection 30 mg Start: 04/24/20 1000 SCDs Start: 04/23/20 1951 Place TED hose Start: 04/23/20 1951     Family Communication: Updated husband at bedside on 12/26   All the records are reviewed and case discussed with Care Management/Social Worker. Management plans discussed with the patient, orthopedics and they are in agreement.  CODE STATUS: Full Code  TOTAL TIME TAKING CARE OF THIS  PATIENT: 35 minutes.   More than 50% of the time was spent in counseling/coordination of care: YES  POSSIBLE D/C IN 1 DAYS, DEPENDING ON CLINICAL CONDITION.   Max Sane M.D on 04/27/2020 at 1:48 PM  [facility]   CC: Primary care physician; [name], MD  Note: This dictation was prepared with Dragon dictation along with smaller phrase technology. Any transcriptional errors that result from this process are unintentional.

## 2020-04-28 DIAGNOSIS — N2581 Secondary hyperparathyroidism of renal origin: Secondary | ICD-10-CM | POA: Diagnosis not present

## 2020-04-28 DIAGNOSIS — N186 End stage renal disease: Secondary | ICD-10-CM | POA: Diagnosis not present

## 2020-04-28 DIAGNOSIS — D509 Iron deficiency anemia, unspecified: Secondary | ICD-10-CM | POA: Diagnosis not present

## 2020-04-28 DIAGNOSIS — Z992 Dependence on renal dialysis: Secondary | ICD-10-CM | POA: Diagnosis not present

## 2020-04-28 DIAGNOSIS — Z419 Encounter for procedure for purposes other than remedying health state, unspecified: Secondary | ICD-10-CM

## 2020-04-28 MED ORDER — AMOXICILLIN-POT CLAVULANATE 875-125 MG PO TABS
1.0000 | ORAL_TABLET | Freq: Two times a day (BID) | ORAL | Status: DC
  Administered 2020-04-28: 13:00:00 1 via ORAL
  Filled 2020-04-28: qty 1

## 2020-04-28 MED ORDER — AMOXICILLIN-POT CLAVULANATE 875-125 MG PO TABS: 1.0000 | ORAL_TABLET | Freq: Two times a day (BID) | ORAL | 0 refills | Status: AC

## 2020-04-28 NOTE — Progress Notes (Signed)
[facility], [place] 04/28/20  Subjective:   LOS: 6  Patient feeling well today. Pain control is fair. PD progressing well.     Objective:  Vital signs in last 24 hours:  Temp:  [97.7 F (36.5 C)-99 F (37.2 C)] 99 F (37.2 C) (12/27 1245) Pulse Rate:  [66-74] 67 (12/27 1245) Resp:  [15-20] 15 (12/27 1245) BP: (95-151)/(41-73) 122/61 (12/27 1245) SpO2:  [94 %-99 %] 99 % (12/27 1245)  Weight change:  Filed Weights   04/22/20 1541 04/25/20 0500 04/26/20 0500  Weight: 49.9 kg 52 kg 53.5 kg    Intake/Output:    Intake/Output Summary (Last 24 hours) at 04/28/2020 1348 Last data filed at 04/28/2020 1020 Gross per 24 hour  Intake 240 ml  Output 300 ml  Net -60 ml    Physical Exam: General:  No acute distress, laying in the bed  HEENT  anicteric, moist oral mucous membrane  Pulm/lungs  normal breathing effort, lungs are clear to auscultation  CVS/Heart  regular rhythm, no rub or gallop  Abdomen:   Soft, nontender  Extremities:  No peripheral edema  Neurologic:  Alert, oriented, able to follow commands  Skin:  No acute rashes  PD catheter in place  Basic Metabolic Panel:  Recent Labs  Lab 04/22/20 1644 04/23/20 1048 04/24/20 0554 04/25/20 0612  NA 125* 125* 126* 123*  K 3.6 4.4 4.5 4.0  CL 89* 90* 94* 89*  CO2 20* 23 21* 21*  GLUCOSE 99 111* 116* 101*  BUN 50* 56* 58* 53*  CREATININE 5.49* 5.72* 5.82* 5.55*  CALCIUM 8.1* 8.3* 8.1* 8.3*     CBC: Recent Labs  Lab 04/22/20 1644 04/23/20 1048 04/24/20 0554 04/25/20 0612 04/26/20 0433  WBC 10.3 11.0* 10.9* 9.9 8.2  NEUTROABS 8.0*  --   --   --   --   HGB 11.9* 12.5 10.7* 10.4* 9.7*  HCT 33.7* 35.5* 31.0* 30.6* 28.5*  MCV 95.5 96.5 96.0 97.1 97.6  PLT 246 217 195 190 168      Lab Results  Component Value Date   HEPBSAG NON REACTIVE 04/27/2020      Microbiology:  Recent Results (from the past 240 hour(s))  Resp Panel by RT-PCR (Flu A&B, Covid) Nasopharyngeal  Swab     Status: None   Collection Time: 04/22/20  4:44 PM   Specimen: Nasopharyngeal Swab; Nasopharyngeal(NP) swabs in vial transport medium  Result Value Ref Range Status   SARS Coronavirus 2 by RT PCR NEGATIVE NEGATIVE Final    Comment: (NOTE) SARS-CoV-2 target nucleic acids are NOT DETECTED.  The SARS-CoV-2 RNA is generally detectable in upper respiratory specimens during the acute phase of infection. The lowest concentration of SARS-CoV-2 viral copies this assay can detect is 138 copies/mL. A negative result does not preclude SARS-Cov-2 infection and should not be used as the sole basis for treatment or other patient management decisions. A negative result may occur with  improper specimen collection/handling, submission of specimen other than nasopharyngeal swab, presence of viral mutation(s) within the areas targeted by this assay, and inadequate number of viral copies(<138 copies/mL). A negative result must be combined with clinical observations, patient history, and epidemiological information. The expected result is Negative.  Fact Sheet for Patients:  EntrepreneurPulse.com.au  Fact Sheet for Healthcare Providers:  IncredibleEmployment.be  This test is no t yet approved or cleared by the [place] FDA and  has been authorized for detection and/or diagnosis of SARS-CoV-2 by FDA under an Emergency Use Authorization (  EUA). This EUA will remain  in effect (meaning this test can be used) for the duration of the COVID-19 declaration under Section 564(b)(1) of the Act, 21 U.S.C.section 360bbb-3(b)(1), unless the authorization is terminated  or revoked sooner.       Influenza A by PCR NEGATIVE NEGATIVE Final   Influenza B by PCR NEGATIVE NEGATIVE Final    Comment: (NOTE) The Xpert Xpress SARS-CoV-2/FLU/RSV plus assay is intended as an aid in the diagnosis of influenza from Nasopharyngeal swab specimens and should not be used as a sole  basis for treatment. Nasal washings and aspirates are unacceptable for Xpert Xpress SARS-CoV-2/FLU/RSV testing.  Fact Sheet for Patients: EntrepreneurPulse.com.au  Fact Sheet for Healthcare Providers: IncredibleEmployment.be  This test is not yet approved or cleared by the [place] FDA and has been authorized for detection and/or diagnosis of SARS-CoV-2 by FDA under an Emergency Use Authorization (EUA). This EUA will remain in effect (meaning this test can be used) for the duration of the COVID-19 declaration under Section 564(b)(1) of the Act, 21 U.S.C. section 360bbb-3(b)(1), unless the authorization is terminated or revoked.  Performed at [facility], [address] [place], [place] [address]     Coagulation Studies: No results for input(s): LABPROT, INR in the last 72 hours.  Urinalysis: No results for input(s): COLORURINE, LABSPEC, PHURINE, GLUCOSEU, HGBUR, BILIRUBINUR, KETONESUR, PROTEINUR, UROBILINOGEN, NITRITE, LEUKOCYTESUR in the last 72 hours.  Invalid input(s): APPERANCEUR    Imaging: No results found.   Medications:   . sodium chloride Stopped (04/26/20 0648)  . lactated ringers 10 mL/hr at 04/23/20 1641  . methocarbamol (ROBAXIN) IV     . amLODipine  10 mg Oral Daily  . amoxicillin-clavulanate  1 tablet Oral Q12H  . buPROPion  75 mg Oral BID  . calcium acetate  667 mg Oral TID WC  . carvedilol  25 mg Oral BID WC  . cloNIDine  0.2 mg Oral BID  . docusate sodium  100 mg Oral BID  . enoxaparin (LOVENOX) injection  30 mg Subcutaneous Q24H  . feeding supplement (NEPRO CARB STEADY)  237 mL Oral BID BM  . gentamicin cream  1 application Topical Daily  . losartan  100 mg Oral QHS  . multivitamin  1 tablet Oral QHS  . pantoprazole  40 mg Oral Daily  . senna  1 tablet Oral BID  . traMADol  50 mg Oral Q6H  . zolpidem  5 mg Oral QHS   acetaminophen, alum & mag hydroxide-simeth, bisacodyl,  butalbital-acetaminophen-caffeine, heparin, hydrALAZINE, LORazepam, magnesium citrate, menthol-cetylpyridinium **OR** phenol, methocarbamol **OR** methocarbamol (ROBAXIN) IV, ondansetron **OR** ondansetron (ZOFRAN) IV, polyethylene glycol  Assessment/ Plan:  69 y.o. female with ESRD, hypertension, atrial fibrillation, anemia, history of depression, hospitalization for syncope August 2021 was admitted on 04/22/2020 for  Active Problems:   Essential hypertension   Closed right hip fracture, initial encounter ([place])   S/P ORIF (open reduction internal fixation) fracture   Surgery, elective  Hip fracture ([place]) [S72.009A] Closed right hip fracture, initial encounter ([place]) [S72.001A]  #. ESRD [name]/4 x 2000 cc/no last fill/EDW 51 kg. Patient will plan to resume peritoneal dialysis at home tonight.  It has been progressing well over this hospitalization.  #. Anemia of CKD  Lab Results  Component Value Date   HGB 9.7 (L) 04/26/2020   We will plan to resume Epogen as an outpatient.  #. Secondary hyperparathyroidism of renal origin N 25.81   No results found for: PTH Lab Results  Component Value Date  PHOS 6.1 (H) 11/08/2018   Continue to monitor serum phosphorus as an outpatient.   #.  Right hip closed fracture  Right Intertrochanteric Hip Fracture PROCEDURE:  INTRAMEDULLARY FIXATION FOR RIGHT INTERTROCHANTERIC HIP FRACTURE 04/23/2020 SURGEON:  Thornton Park   #chronic hyponatremia Ranges from 125-131   LOS: 6 Amy [name] 12/27/20211:48 PM  [facility] [place], [place]

## 2020-04-28 NOTE — Progress Notes (Signed)
  Subjective:  POD #5 s/p intramedullary fixation for right intertrochanteric hip fracture.   Patient reports right hip pain as mild to moderate.    Objective:   VITALS:   Vitals:   04/27/20 2026 04/27/20 2322 04/28/20 0514 04/28/20 0831  BP: 136/66 (!) 95/41 (!) 131/59 (!) 151/73  Pulse: 72 74 72 69  Resp: 18 16 16 18   Temp: 98.4 F (36.9 C) 98.4 F (36.9 C) 98.7 F (37.1 C) 97.7 F (36.5 C)  TempSrc:    Oral  SpO2: 97% 94% 98% 99%  Weight:      Height:        PHYSICAL EXAM: Right lower extremity: I again personally change the patient's proximal dressing today which had serosanguineous drainage.  There is no erythema or fluctuance.  There is no evidence of purulent drainage.  There is no active drainage from the incision. Neurovascular intact Sensation intact distally Intact pulses distally Dorsiflexion/Plantar flexion intact Incision: no drainage No cellulitis present Compartment soft  LABS  No results found for this or any previous visit (from the past 24 hour(s)).  No results found.  Assessment/Plan: 5 Days Post-Op   Active Problems:   Essential hypertension   Closed right hip fracture, initial encounter (HCC)   S/P ORIF (open reduction internal fixation) fracture   Surgery, elective  Patient will be discharged home today.  I am going to start her on Augmentin 875 mg p.o. twice daily x10 days until her serosanguineous drainage stops.  There is no evidence of active infection.  The antibiotics are for empiric treatment to prevent infection while her incision drains.  Patient will be on enteric-coated aspirin 325 mg p.o. twice daily upon discharge for DVT prophylaxis.  She may weight-bear as tolerated on right lower extremity.  Patient will follow up with me in 10 to 14 days.  Patient states she can tolerate amoxicillin despite allergies to penicillin and cephalosporins.    Thornton Park , MD 04/28/2020, 10:59 AM

## 2020-04-28 NOTE — Care Management Important Message (Signed)
Important Message  Patient Details  Name: Amy [name] MRN: [id number] Date of Birth: Jun 26, 1950   Medicare Important Message Given:  Yes     [name] 04/28/2020, 11:03 AM

## 2020-04-28 NOTE — Progress Notes (Signed)
Discharge note:  Patient discharged home today with home health services. Discharge instructions provided to patient. Verbalized understanding. Driven home by family member. Wheeled to car by staff.  Ronnette Hila, RN

## 2020-04-28 NOTE — Progress Notes (Signed)
PT Cancellation Note  Patient Details Name: Amy [name] MRN: [id number] DOB: Jan 21, 1951   Cancelled Treatment:    Reason Eval/Treat Not Completed: Other (comment)   Pt offered session this am.  Stated she was leaving after breakfast and did not want to participate.  Stated we typically see pt before discharge and she again declined stating she felt like getting home would be challenging enough.  Given no difficulty with mobility yesterday, did not push pt to participate.   Chesley Noon 04/28/2020, 9:50 AM

## 2020-04-28 NOTE — TOC Transition Note (Signed)
Transition of Care [facility]) - CM/SW Discharge Note   Patient Details  Name: MONNA CREAN MRN: [id number] Date of Birth: 07-08-1950  Transition of Care [facility]) CM/SW Contact:  Magnus Ivan, LCSW Phone Number: 04/28/2020, 10:45 AM   Clinical Narrative:   Patient has orders to DC home today. Notified Corene Cornea with [place]. Per notes, patient already has a 3 in 1 at home and no issues with transportation. CSW signing off.   Final next level of care: [place] Barriers to Discharge: Continued Medical Work up   Patient Goals and CMS Choice        Discharge Placement                  Name of family member notified: patient aware Patient and family notified of of transfer: 04/28/20  Discharge Plan and Services                          [place]: PT,OT [place] Agency: [place] ([place]) Date [place]: 04/28/20 Time [place]: [phone] Representative spoke with at [place]: [place] ([place]) Interventions     Readmission Risk Interventions No flowsheet data found.

## 2020-04-29 ENCOUNTER — Telehealth: Payer: Self-pay

## 2020-04-29 DIAGNOSIS — N2581 Secondary hyperparathyroidism of renal origin: Secondary | ICD-10-CM | POA: Diagnosis not present

## 2020-04-29 DIAGNOSIS — D509 Iron deficiency anemia, unspecified: Secondary | ICD-10-CM | POA: Diagnosis not present

## 2020-04-29 DIAGNOSIS — N186 End stage renal disease: Secondary | ICD-10-CM | POA: Diagnosis not present

## 2020-04-29 DIAGNOSIS — Z992 Dependence on renal dialysis: Secondary | ICD-10-CM | POA: Diagnosis not present

## 2020-04-29 LAB — HEPATITIS B DNA, ULTRAQUANTITATIVE, PCR
HBV DNA SERPL PCR-ACNC: NOT DETECTED IU/mL
HBV DNA SERPL PCR-LOG IU: UNDETERMINED log10 IU/mL

## 2020-04-29 NOTE — Telephone Encounter (Signed)
Transition Care Management Follow-up Telephone Call  Date of discharge and from where: [facility] on 04/28/20  How have you been since you were released from the hospital? Pt states she is doing ok. She is using her walker at all times to get around. Pt states she does have right hip pain an currently it is a 5. Pain gets worse when she gets up and moves around. Pt is taking Tramadol as needed for pain. The incision sites are draining but husband plans to change the dressing once a day. Husband states the area looks fine just has a lot of drainage. Drainage was yellowish in color and had a slight odor. Husband states the area is swollen but he did not see any blood. Declines pain elsewhere, SOB,  weakness or n/v/d.  Any questions or concerns? No  Items Reviewed:  Did the pt receive and understand the discharge instructions provided? Yes   Medications obtained and verified? No, husband declined reviewing at this time and states everything is the same. Did verify all new medications and d/c.  Any new allergies since your discharge? No   Dietary orders reviewed? Yes  Do you have support at home? Yes   Other (ie: DME, Home Health, etc):  Pt was d/c with a walker. HHPT was ordered.   Functional Questionnaire: (I = Independent and D = Dependent)  Bathing/Dressing- D currently   Meal Prep- D  Eating- I  Maintaining continence- I  Transferring/Ambulation- D, uses a walker at all times.  Managing Meds- I   Follow up appointments reviewed:    PCP Hospital f/u appt confirmed? Yes  scheduled to see Dr Caryn Section on 05/06/20 @ 2:00 PM.  [facility] f/u appt confirmed? Yes    Are transportation arrangements needed? No   If their condition worsens, is the pt aware to call  their PCP or go to the ED? Yes  Was the patient provided with contact information for the PCP's office or ED? Yes  Was the pt encouraged to call back with questions or concerns? Yes

## 2020-04-29 NOTE — Discharge Summary (Signed)
[place] at [place] NAME: Amy [name]    MR#:  [id number]  DATE OF BIRTH:  11/26/1950  DATE OF ADMISSION:  04/22/2020   ADMITTING PHYSICIAN: Lequita Halt, MD  DATE OF DISCHARGE: 04/28/2020  2:07 PM  PRIMARY CARE PHYSICIAN: [name], MD   ADMISSION DIAGNOSIS:  Hip fracture ([place]) [S72.009A] Closed right hip fracture, initial encounter ([place]) [S72.001A] DISCHARGE DIAGNOSIS:  Active Problems:   Essential hypertension   Closed right hip fracture, initial encounter ([place])   S/P ORIF (open reduction internal fixation) fracture   Surgery, elective  SECONDARY DIAGNOSIS:   Past Medical History:  Diagnosis Date  . Anemia    a. 08/2016 Guaiac + stool. EGD w/ gastritis.  . Anxiety   . CKD (chronic kidney disease), stage III ([place])   . Closed fracture of shaft of left ulna 11/27/2018  . Colon polyp    a. 08/2016 Colonoscopy.  . Gastritis    a. 08/2016 EGD: Gastritis-->PPI.  [name] GERD (gastroesophageal reflux disease)   . History of chicken pox   . History of measles as a child   . Hypertension   . Hyponatremia    a. 08/2016 in setting of HCTZ Rx.  . Mesenteric lymphadenopathy 04/08/2017  . Multiple thyroid nodules    HOSPITAL COURSE:  68 year old female with a known history of ESRD on PD, refractory hypertension, anxiety/depression, anemia of chronic kidney disease, GERD is admitted for right hip fracture status post mechanical fall.  Right intertrochanteric hip fracture Status post intramedullary fixation on 12/22. Appropriate post-op course  Vomiting Transient and resolved. Likely due to narcotics - I've cut back with improvement in Nausea/vomiting symptoms  Uncontrolled hypertension Due to pain, now improved.  Anxiety and depression Continue home meds  ESRD on peritoneal dialysis Dialyzed per Nephro  Chronic hyponatremia Sodium ranging from 125-130 Asymptomatic  Low-grade fever Could be from thrombophlebitis on right elbow area  IV which has been removed with no recurrence of fever.  I have requested her to try hot compresses.  I have also encouraged her to use incentive spirometry as atelectasis can also give her low-grade fever. Empirically started her on Augmentin at D/C to prevent infection while her surgical incision drains (serosanguinous drainage)   DISCHARGE CONDITIONS:  stable CONSULTS OBTAINED:   DRUG ALLERGIES:   Allergies  Allergen Reactions  . Cephalosporins Hives and Swelling  . Clarithromycin   . Lunesta  [Eszopiclone]     Heart racing  . Penicillin V     Has patient had a PCN reaction causing immediate rash, facial/tongue/throat swelling, SOB or lightheadedness with hypotension: Yes Has patient had a PCN reaction causing severe rash involving mucus membranes or skin necrosis: No Has patient had a PCN reaction that required hospitalization No Has patient had a PCN reaction occurring within the last 10 years: No If all of the above answers are "NO", then may proceed with Cephalosporin use.  . Sulfa Antibiotics    DISCHARGE MEDICATIONS:   Allergies as of 04/28/2020      Reactions   Cephalosporins Hives, Swelling   Clarithromycin    Lunesta  [eszopiclone]    Heart racing   Penicillin V    Has patient had a PCN reaction causing immediate rash, facial/tongue/throat swelling, SOB or lightheadedness with hypotension: Yes Has patient had a PCN reaction causing severe rash involving mucus membranes or skin necrosis: No Has patient had a PCN reaction that required hospitalization No Has patient had a PCN reaction occurring within  the last 10 years: No If all of the above answers are "NO", then may proceed with Cephalosporin use.   Sulfa Antibiotics       Medication List    STOP taking these medications   amLODipine 10 MG tablet Commonly known as: NORVASC   b complex-vitamin c-folic acid 0.8 MG Tabs tablet     TAKE these medications   amoxicillin-clavulanate 875-125 MG tablet Commonly  known as: Augmentin Take 1 tablet by mouth every 12 (twelve) hours for 10 days. Notes to patient: None given in hospital   aspirin EC 325 MG tablet Take 1 tablet (325 mg total) by mouth in the morning and at bedtime. Notes to patient: None given in hospital   buPROPion 75 MG tablet Commonly known as: WELLBUTRIN TAKE 1 TABLET(75 MG) BY MOUTH TWICE DAILY   butalbital-acetaminophen-caffeine 50-325-40 MG tablet Commonly known as: FIORICET TAKE 1 TO 2 TABLETS BY MOUTH EVERY 4 HOURS AS NEEDED FOR HEADACHE   calcium acetate 667 MG capsule Commonly known as: PHOSLO Take 1 capsule by mouth 3 (three) times daily with meals.   carvedilol 25 MG tablet Commonly known as: COREG Take 1 tablet (25 mg total) by mouth 2 (two) times daily with a meal.   cloNIDine 0.2 MG tablet Commonly known as: CATAPRES Take 1 tablet (0.2 mg total) by mouth 2 (two) times daily.   EPOGEN IJ Inject as directed. Notes to patient: None given in hospital   esomeprazole 20 MG capsule Commonly known as: NEXIUM Take 20 mg by mouth daily. Notes to patient: None given in hospital   hydrALAZINE 50 MG tablet Commonly known as: APRESOLINE Take 1 tablet (50 mg total) by mouth 3 (three) times daily as needed. For SBP > 160 mm hg   LORazepam 1 MG tablet Commonly known as: ATIVAN TAKE 1 TABLET(1 MG) BY MOUTH TWICE DAILY AS NEEDED   losartan 100 MG tablet Commonly known as: COZAAR Take 100 mg by mouth at bedtime.   sennosides-docusate sodium 8.6-50 MG tablet Commonly known as: SENOKOT-S Take 2 tablets by mouth 2 (two) times daily as needed for constipation. Notes to patient: None given in hospital   traMADol 50 MG tablet Commonly known as: ULTRAM Take 1 tablet (50 mg total) by mouth every 12 (twelve) hours as needed for up to 7 days for severe pain.   valACYclovir 1000 MG tablet Commonly known as: VALTREX Take 1,000 mg by mouth 2 (two) times daily as needed. Notes to patient: None given in hospital    zolpidem 10 MG tablet Commonly known as: AMBIEN TAKE 1/2 TO 1 TABLET(5 TO 10 MG) BY MOUTH AT BEDTIME AS NEEDED            Discharge Care Instructions  (From admission, onward)         Start     Ordered   04/26/20 0000  Discharge wound care:       Comments: As above   04/26/20 0816         DISCHARGE INSTRUCTIONS:   DIET:  Cardiac diet DISCHARGE CONDITION:  Stable ACTIVITY:  Activity as tolerated Weight bear as tolerated on right lower extremity OXYGEN:  Home Oxygen: No.  Oxygen Delivery: room air DISCHARGE LOCATION:  Home with HHPT, OT   If you experience worsening of your admission symptoms, develop shortness of breath, life threatening emergency, suicidal or homicidal thoughts you must seek medical attention immediately by calling 911 or calling your MD immediately  if symptoms less severe.  You Must  read complete instructions/literature along with all the possible adverse reactions/side effects for all the Medicines you take and that have been prescribed to you. Take any new Medicines after you have completely understood and accpet all the possible adverse reactions/side effects.   Please note  You were cared for by a hospitalist during your hospital stay. If you have any questions about your discharge medications or the care you received while you were in the hospital after you are discharged, you can call the unit and asked to speak with the hospitalist on call if the hospitalist that took care of you is not available. Once you are discharged, your primary care physician will handle any further medical issues. Please note that NO REFILLS for any discharge medications will be authorized once you are discharged, as it is imperative that you return to your primary care physician (or establish a relationship with a primary care physician if you do not have one) for your aftercare needs so that they can reassess your need for medications and monitor your lab  values.    On the day of Discharge:  VITAL SIGNS:  Blood pressure 122/61, pulse 67, temperature 99 F (37.2 C), temperature source Oral, resp. rate 15, height 5\' 4"  (1.626 m), weight 53.5 kg, SpO2 99 %. PHYSICAL EXAMINATION:  GENERAL:  69 y.o.-year-old patient lying in the bed with no acute distress.  EYES: Pupils equal, round, reactive to light and accommodation. No scleral icterus. Extraocular muscles intact.  HEENT: Head atraumatic, normocephalic. Oropharynx and nasopharynx clear.  NECK:  Supple, no jugular venous distention. No thyroid enlargement, no tenderness.  LUNGS: Normal breath sounds bilaterally, no wheezing, rales,rhonchi or crepitation. No use of accessory muscles of respiration.  CARDIOVASCULAR: S1, S2 normal. No murmurs, rubs, or gallops.  ABDOMEN: Soft, non-tender, non-distended. Bowel sounds present. No organomegaly or mass.  EXTREMITIES: No pedal edema, cyanosis, or clubbing.  NEUROLOGIC: Cranial nerves II through XII are intact. Muscle strength 5/5 in all extremities. Sensation intact. Gait not checked.  PSYCHIATRIC: The patient is alert and oriented x 3.  SKIN: No obvious rash, lesion, or ulcer.  DATA REVIEW:   CBC Recent Labs  Lab 04/26/20 0433  WBC 8.2  HGB 9.7*  HCT 28.5*  PLT 168    Chemistries  Recent Labs  Lab 04/25/20 0612  NA 123*  K 4.0  CL 89*  CO2 21*  GLUCOSE 101*  BUN 53*  CREATININE 5.55*  CALCIUM 8.3*     Outpatient follow-up  Follow-up Information    [name], MD. Schedule an appointment as soon as possible for a visit in 1 week(s).   Specialty: Family Medicine Contact information: [address] [place] [address] [phone]        Thornton Park, MD. Schedule an appointment as soon as possible for a visit on 05/06/2020.   Specialty: Orthopedic Surgery Why: at [place] information: [place] [address] [phone]                Management plans  discussed with the patient, family and they are in agreement.  CODE STATUS: Prior   TOTAL TIME TAKING CARE OF THIS PATIENT: 45 minutes.    Max Sane M.D on 04/29/2020 at 8:39 PM  [facility]   CC: Primary care physician; [name], MD   Note: This dictation was prepared with Dragon dictation along with smaller phrase technology. Any transcriptional errors that result from this process are unintentional.

## 2020-04-29 NOTE — Telephone Encounter (Signed)
HFU not scheduled. 

## 2020-04-30 DIAGNOSIS — Z86010 Personal history of colonic polyps: Secondary | ICD-10-CM | POA: Diagnosis not present

## 2020-04-30 DIAGNOSIS — Z90710 Acquired absence of both cervix and uterus: Secondary | ICD-10-CM | POA: Diagnosis not present

## 2020-04-30 DIAGNOSIS — R590 Localized enlarged lymph nodes: Secondary | ICD-10-CM | POA: Diagnosis not present

## 2020-04-30 DIAGNOSIS — I120 Hypertensive chronic kidney disease with stage 5 chronic kidney disease or end stage renal disease: Secondary | ICD-10-CM | POA: Diagnosis not present

## 2020-04-30 DIAGNOSIS — Z7982 Long term (current) use of aspirin: Secondary | ICD-10-CM | POA: Diagnosis not present

## 2020-04-30 DIAGNOSIS — K2970 Gastritis, unspecified, without bleeding: Secondary | ICD-10-CM | POA: Diagnosis not present

## 2020-04-30 DIAGNOSIS — F419 Anxiety disorder, unspecified: Secondary | ICD-10-CM | POA: Diagnosis not present

## 2020-04-30 DIAGNOSIS — Z79891 Long term (current) use of opiate analgesic: Secondary | ICD-10-CM | POA: Diagnosis not present

## 2020-04-30 DIAGNOSIS — D509 Iron deficiency anemia, unspecified: Secondary | ICD-10-CM | POA: Diagnosis not present

## 2020-04-30 DIAGNOSIS — W19XXXD Unspecified fall, subsequent encounter: Secondary | ICD-10-CM | POA: Diagnosis not present

## 2020-04-30 DIAGNOSIS — E871 Hypo-osmolality and hyponatremia: Secondary | ICD-10-CM | POA: Diagnosis not present

## 2020-04-30 DIAGNOSIS — N2581 Secondary hyperparathyroidism of renal origin: Secondary | ICD-10-CM | POA: Diagnosis not present

## 2020-04-30 DIAGNOSIS — K219 Gastro-esophageal reflux disease without esophagitis: Secondary | ICD-10-CM | POA: Diagnosis not present

## 2020-04-30 DIAGNOSIS — Z992 Dependence on renal dialysis: Secondary | ICD-10-CM | POA: Diagnosis not present

## 2020-04-30 DIAGNOSIS — S72141D Displaced intertrochanteric fracture of right femur, subsequent encounter for closed fracture with routine healing: Secondary | ICD-10-CM | POA: Diagnosis not present

## 2020-04-30 DIAGNOSIS — S72141A Displaced intertrochanteric fracture of right femur, initial encounter for closed fracture: Secondary | ICD-10-CM | POA: Diagnosis not present

## 2020-04-30 DIAGNOSIS — N186 End stage renal disease: Secondary | ICD-10-CM | POA: Diagnosis not present

## 2020-04-30 DIAGNOSIS — Z9181 History of falling: Secondary | ICD-10-CM | POA: Diagnosis not present

## 2020-04-30 DIAGNOSIS — Z9049 Acquired absence of other specified parts of digestive tract: Secondary | ICD-10-CM | POA: Diagnosis not present

## 2020-04-30 DIAGNOSIS — Z8781 Personal history of (healed) traumatic fracture: Secondary | ICD-10-CM | POA: Diagnosis not present

## 2020-04-30 DIAGNOSIS — F32A Depression, unspecified: Secondary | ICD-10-CM | POA: Diagnosis not present

## 2020-04-30 DIAGNOSIS — D631 Anemia in chronic kidney disease: Secondary | ICD-10-CM | POA: Diagnosis not present

## 2020-04-30 DIAGNOSIS — Z9089 Acquired absence of other organs: Secondary | ICD-10-CM | POA: Diagnosis not present

## 2020-05-01 DIAGNOSIS — Z992 Dependence on renal dialysis: Secondary | ICD-10-CM | POA: Diagnosis not present

## 2020-05-01 DIAGNOSIS — D509 Iron deficiency anemia, unspecified: Secondary | ICD-10-CM | POA: Diagnosis not present

## 2020-05-01 DIAGNOSIS — N2581 Secondary hyperparathyroidism of renal origin: Secondary | ICD-10-CM | POA: Diagnosis not present

## 2020-05-01 DIAGNOSIS — N186 End stage renal disease: Secondary | ICD-10-CM | POA: Diagnosis not present

## 2020-05-02 DIAGNOSIS — N186 End stage renal disease: Secondary | ICD-10-CM | POA: Diagnosis not present

## 2020-05-02 DIAGNOSIS — D509 Iron deficiency anemia, unspecified: Secondary | ICD-10-CM | POA: Diagnosis not present

## 2020-05-02 DIAGNOSIS — Z992 Dependence on renal dialysis: Secondary | ICD-10-CM | POA: Diagnosis not present

## 2020-05-02 DIAGNOSIS — N2581 Secondary hyperparathyroidism of renal origin: Secondary | ICD-10-CM | POA: Diagnosis not present

## 2020-05-03 DIAGNOSIS — N186 End stage renal disease: Secondary | ICD-10-CM | POA: Diagnosis not present

## 2020-05-03 DIAGNOSIS — Z992 Dependence on renal dialysis: Secondary | ICD-10-CM | POA: Diagnosis not present

## 2020-05-04 DIAGNOSIS — N186 End stage renal disease: Secondary | ICD-10-CM | POA: Diagnosis not present

## 2020-05-04 DIAGNOSIS — Z992 Dependence on renal dialysis: Secondary | ICD-10-CM | POA: Diagnosis not present

## 2020-05-05 DIAGNOSIS — S72141D Displaced intertrochanteric fracture of right femur, subsequent encounter for closed fracture with routine healing: Secondary | ICD-10-CM | POA: Diagnosis not present

## 2020-05-05 DIAGNOSIS — E871 Hypo-osmolality and hyponatremia: Secondary | ICD-10-CM | POA: Diagnosis not present

## 2020-05-05 DIAGNOSIS — I120 Hypertensive chronic kidney disease with stage 5 chronic kidney disease or end stage renal disease: Secondary | ICD-10-CM | POA: Diagnosis not present

## 2020-05-05 DIAGNOSIS — N186 End stage renal disease: Secondary | ICD-10-CM | POA: Diagnosis not present

## 2020-05-05 DIAGNOSIS — Z992 Dependence on renal dialysis: Secondary | ICD-10-CM | POA: Diagnosis not present

## 2020-05-05 DIAGNOSIS — D631 Anemia in chronic kidney disease: Secondary | ICD-10-CM | POA: Diagnosis not present

## 2020-05-05 DIAGNOSIS — K219 Gastro-esophageal reflux disease without esophagitis: Secondary | ICD-10-CM | POA: Diagnosis not present

## 2020-05-05 NOTE — Progress Notes (Deleted)
      Established patient visit   Patient: Amy [name]   DOB: [birth year]   70 y.o. Female  MRN: [id number] Visit Date: 05/06/2020  Today's healthcare provider: Lelon Huh, MD   No chief complaint on file.  Subjective    HPI  Follow up Hospitalization  Patient was [facility]/20 and discharged on 04/28/2020. She was treated for closed right hip fracture. Treatment for this included OT and rehab. Telephone follow up was done on 04/29/2020 She reports {excellent/good/fair:19992} compliance with treatment. She reports this condition is {resolved/improved/worsened:23923}.   {Show patient history (optional):23778::" "}   Medications: Outpatient Medications Prior to Visit  Medication Sig  . amoxicillin-clavulanate (AUGMENTIN) 875-125 MG tablet Take 1 tablet by mouth every 12 (twelve) hours for 10 days.  [name] aspirin EC 325 MG tablet Take 1 tablet (325 mg total) by mouth in the morning and at bedtime.  [name] buPROPion (WELLBUTRIN) 75 MG tablet TAKE 1 TABLET(75 MG) BY MOUTH TWICE DAILY  . butalbital-acetaminophen-caffeine (FIORICET) 50-325-40 MG tablet TAKE 1 TO 2 TABLETS BY MOUTH EVERY 4 HOURS AS NEEDED FOR HEADACHE  . calcium acetate (PHOSLO) 667 MG capsule Take 1 capsule by mouth 3 (three) times daily with meals.  . carvedilol (COREG) 25 MG tablet Take 1 tablet (25 mg total) by mouth 2 (two) times daily with a meal.  . cloNIDine (CATAPRES) 0.2 MG tablet Take 1 tablet (0.2 mg total) by mouth 2 (two) times daily.  [name] Epoetin Alfa (EPOGEN IJ) Inject as directed.  [name] esomeprazole (NEXIUM) 20 MG capsule Take 20 mg by mouth daily.  . hydrALAZINE (APRESOLINE) 50 MG tablet Take 1 tablet (50 mg total) by mouth 3 (three) times daily as needed. For SBP > 160 mm hg  . LORazepam (ATIVAN) 1 MG tablet TAKE 1 TABLET(1 MG) BY MOUTH TWICE DAILY AS NEEDED  . losartan (COZAAR) 100 MG tablet Take 100 mg by mouth at bedtime.  . sennosides-docusate sodium (SENOKOT-S) 8.6-50 MG tablet Take 2  tablets by mouth 2 (two) times daily as needed for constipation.  . valACYclovir (VALTREX) 1000 MG tablet Take 1,000 mg by mouth 2 (two) times daily as needed.  . zolpidem (AMBIEN) 10 MG tablet TAKE 1/2 TO 1 TABLET(5 TO 10 MG) BY MOUTH AT BEDTIME AS NEEDED   No facility-administered medications prior to visit.    Review of Systems  {Heme  Chem  Endocrine  Serology  Results Review (optional):23779::" "}  Objective    There were no vitals taken for this visit. {Show previous vital signs (optional):23777::" "}  Physical Exam  ***  No results found for any visits on 05/06/20.  Assessment & Plan     ***  No follow-ups on file.      {provider attestation***:1}   Lelon Huh, MD  [facility] [phone] (phone) [phone] (fax)  [place]

## 2020-05-06 ENCOUNTER — Inpatient Hospital Stay: Payer: Medicare Other | Admitting: Family Medicine

## 2020-05-06 DIAGNOSIS — N186 End stage renal disease: Secondary | ICD-10-CM | POA: Diagnosis not present

## 2020-05-06 DIAGNOSIS — Z992 Dependence on renal dialysis: Secondary | ICD-10-CM | POA: Diagnosis not present

## 2020-05-07 DIAGNOSIS — I120 Hypertensive chronic kidney disease with stage 5 chronic kidney disease or end stage renal disease: Secondary | ICD-10-CM | POA: Diagnosis not present

## 2020-05-07 DIAGNOSIS — N186 End stage renal disease: Secondary | ICD-10-CM | POA: Diagnosis not present

## 2020-05-07 DIAGNOSIS — S72141D Displaced intertrochanteric fracture of right femur, subsequent encounter for closed fracture with routine healing: Secondary | ICD-10-CM | POA: Diagnosis not present

## 2020-05-07 DIAGNOSIS — E871 Hypo-osmolality and hyponatremia: Secondary | ICD-10-CM | POA: Diagnosis not present

## 2020-05-07 DIAGNOSIS — Z992 Dependence on renal dialysis: Secondary | ICD-10-CM | POA: Diagnosis not present

## 2020-05-07 DIAGNOSIS — K219 Gastro-esophageal reflux disease without esophagitis: Secondary | ICD-10-CM | POA: Diagnosis not present

## 2020-05-07 DIAGNOSIS — D631 Anemia in chronic kidney disease: Secondary | ICD-10-CM | POA: Diagnosis not present

## 2020-05-08 DIAGNOSIS — Z992 Dependence on renal dialysis: Secondary | ICD-10-CM | POA: Diagnosis not present

## 2020-05-08 DIAGNOSIS — N186 End stage renal disease: Secondary | ICD-10-CM | POA: Diagnosis not present

## 2020-05-09 DIAGNOSIS — S72141D Displaced intertrochanteric fracture of right femur, subsequent encounter for closed fracture with routine healing: Secondary | ICD-10-CM | POA: Diagnosis not present

## 2020-05-09 DIAGNOSIS — E871 Hypo-osmolality and hyponatremia: Secondary | ICD-10-CM | POA: Diagnosis not present

## 2020-05-09 DIAGNOSIS — D631 Anemia in chronic kidney disease: Secondary | ICD-10-CM | POA: Diagnosis not present

## 2020-05-09 DIAGNOSIS — N186 End stage renal disease: Secondary | ICD-10-CM | POA: Diagnosis not present

## 2020-05-09 DIAGNOSIS — I120 Hypertensive chronic kidney disease with stage 5 chronic kidney disease or end stage renal disease: Secondary | ICD-10-CM | POA: Diagnosis not present

## 2020-05-09 DIAGNOSIS — K219 Gastro-esophageal reflux disease without esophagitis: Secondary | ICD-10-CM | POA: Diagnosis not present

## 2020-05-09 DIAGNOSIS — Z992 Dependence on renal dialysis: Secondary | ICD-10-CM | POA: Diagnosis not present

## 2020-05-10 DIAGNOSIS — N186 End stage renal disease: Secondary | ICD-10-CM | POA: Diagnosis not present

## 2020-05-10 DIAGNOSIS — Z992 Dependence on renal dialysis: Secondary | ICD-10-CM | POA: Diagnosis not present

## 2020-05-11 DIAGNOSIS — Z992 Dependence on renal dialysis: Secondary | ICD-10-CM | POA: Diagnosis not present

## 2020-05-11 DIAGNOSIS — N186 End stage renal disease: Secondary | ICD-10-CM | POA: Diagnosis not present

## 2020-05-12 DIAGNOSIS — E871 Hypo-osmolality and hyponatremia: Secondary | ICD-10-CM | POA: Diagnosis not present

## 2020-05-12 DIAGNOSIS — Z992 Dependence on renal dialysis: Secondary | ICD-10-CM | POA: Diagnosis not present

## 2020-05-12 DIAGNOSIS — N186 End stage renal disease: Secondary | ICD-10-CM | POA: Diagnosis not present

## 2020-05-12 DIAGNOSIS — K219 Gastro-esophageal reflux disease without esophagitis: Secondary | ICD-10-CM | POA: Diagnosis not present

## 2020-05-12 DIAGNOSIS — S72141D Displaced intertrochanteric fracture of right femur, subsequent encounter for closed fracture with routine healing: Secondary | ICD-10-CM | POA: Diagnosis not present

## 2020-05-12 DIAGNOSIS — D631 Anemia in chronic kidney disease: Secondary | ICD-10-CM | POA: Diagnosis not present

## 2020-05-12 DIAGNOSIS — I120 Hypertensive chronic kidney disease with stage 5 chronic kidney disease or end stage renal disease: Secondary | ICD-10-CM | POA: Diagnosis not present

## 2020-05-13 DIAGNOSIS — Z992 Dependence on renal dialysis: Secondary | ICD-10-CM | POA: Diagnosis not present

## 2020-05-13 DIAGNOSIS — N186 End stage renal disease: Secondary | ICD-10-CM | POA: Diagnosis not present

## 2020-05-14 DIAGNOSIS — K219 Gastro-esophageal reflux disease without esophagitis: Secondary | ICD-10-CM | POA: Diagnosis not present

## 2020-05-14 DIAGNOSIS — S72141D Displaced intertrochanteric fracture of right femur, subsequent encounter for closed fracture with routine healing: Secondary | ICD-10-CM | POA: Diagnosis not present

## 2020-05-14 DIAGNOSIS — E871 Hypo-osmolality and hyponatremia: Secondary | ICD-10-CM | POA: Diagnosis not present

## 2020-05-14 DIAGNOSIS — Z992 Dependence on renal dialysis: Secondary | ICD-10-CM | POA: Diagnosis not present

## 2020-05-14 DIAGNOSIS — D631 Anemia in chronic kidney disease: Secondary | ICD-10-CM | POA: Diagnosis not present

## 2020-05-14 DIAGNOSIS — N186 End stage renal disease: Secondary | ICD-10-CM | POA: Diagnosis not present

## 2020-05-14 DIAGNOSIS — I120 Hypertensive chronic kidney disease with stage 5 chronic kidney disease or end stage renal disease: Secondary | ICD-10-CM | POA: Diagnosis not present

## 2020-05-15 DIAGNOSIS — N186 End stage renal disease: Secondary | ICD-10-CM | POA: Diagnosis not present

## 2020-05-15 DIAGNOSIS — R110 Nausea: Secondary | ICD-10-CM | POA: Diagnosis not present

## 2020-05-15 DIAGNOSIS — Z992 Dependence on renal dialysis: Secondary | ICD-10-CM | POA: Diagnosis not present

## 2020-05-15 DIAGNOSIS — S72144A Nondisplaced intertrochanteric fracture of right femur, initial encounter for closed fracture: Secondary | ICD-10-CM | POA: Diagnosis not present

## 2020-05-16 ENCOUNTER — Other Ambulatory Visit: Payer: Self-pay | Admitting: Family Medicine

## 2020-05-16 DIAGNOSIS — Z992 Dependence on renal dialysis: Secondary | ICD-10-CM | POA: Diagnosis not present

## 2020-05-16 DIAGNOSIS — G4700 Insomnia, unspecified: Secondary | ICD-10-CM

## 2020-05-16 DIAGNOSIS — N186 End stage renal disease: Secondary | ICD-10-CM | POA: Diagnosis not present

## 2020-05-17 DIAGNOSIS — N186 End stage renal disease: Secondary | ICD-10-CM | POA: Diagnosis not present

## 2020-05-17 DIAGNOSIS — Z992 Dependence on renal dialysis: Secondary | ICD-10-CM | POA: Diagnosis not present

## 2020-05-18 DIAGNOSIS — N186 End stage renal disease: Secondary | ICD-10-CM | POA: Diagnosis not present

## 2020-05-18 DIAGNOSIS — Z992 Dependence on renal dialysis: Secondary | ICD-10-CM | POA: Diagnosis not present

## 2020-05-19 DIAGNOSIS — N186 End stage renal disease: Secondary | ICD-10-CM | POA: Diagnosis not present

## 2020-05-19 DIAGNOSIS — Z992 Dependence on renal dialysis: Secondary | ICD-10-CM | POA: Diagnosis not present

## 2020-05-20 DIAGNOSIS — Z992 Dependence on renal dialysis: Secondary | ICD-10-CM | POA: Diagnosis not present

## 2020-05-20 DIAGNOSIS — N186 End stage renal disease: Secondary | ICD-10-CM | POA: Diagnosis not present

## 2020-05-21 DIAGNOSIS — Z992 Dependence on renal dialysis: Secondary | ICD-10-CM | POA: Diagnosis not present

## 2020-05-21 DIAGNOSIS — N186 End stage renal disease: Secondary | ICD-10-CM | POA: Diagnosis not present

## 2020-05-22 DIAGNOSIS — N186 End stage renal disease: Secondary | ICD-10-CM | POA: Diagnosis not present

## 2020-05-22 DIAGNOSIS — Z992 Dependence on renal dialysis: Secondary | ICD-10-CM | POA: Diagnosis not present

## 2020-05-23 DIAGNOSIS — N186 End stage renal disease: Secondary | ICD-10-CM | POA: Diagnosis not present

## 2020-05-23 DIAGNOSIS — Z992 Dependence on renal dialysis: Secondary | ICD-10-CM | POA: Diagnosis not present

## 2020-05-24 DIAGNOSIS — Z992 Dependence on renal dialysis: Secondary | ICD-10-CM | POA: Diagnosis not present

## 2020-05-24 DIAGNOSIS — N186 End stage renal disease: Secondary | ICD-10-CM | POA: Diagnosis not present

## 2020-05-25 ENCOUNTER — Other Ambulatory Visit: Payer: Self-pay | Admitting: Family Medicine

## 2020-05-25 DIAGNOSIS — G8929 Other chronic pain: Secondary | ICD-10-CM

## 2020-05-25 DIAGNOSIS — Z992 Dependence on renal dialysis: Secondary | ICD-10-CM | POA: Diagnosis not present

## 2020-05-25 DIAGNOSIS — N186 End stage renal disease: Secondary | ICD-10-CM | POA: Diagnosis not present

## 2020-05-25 NOTE — Telephone Encounter (Signed)
Requested medication (s) are due for refill today: no  Requested medication (s) are on the active medication list: yes  Last refill:  03/12/20 #150 3 RF  Future visit scheduled: no  Notes to clinic:  med not delegated to NT to RF   Requested Prescriptions  Pending Prescriptions Disp Refills   butalbital-acetaminophen-caffeine (FIORICET) 50-325-40 MG tablet [Pharmacy Med Name: BUTALBITAL/ACETAMINOPHEN/CAFF TABS] 150 tablet     Sig: TAKE 1 TO 2 TABLETS BY MOUTH EVERY 4 HOURS AS NEEDED FOR HEADACHE      Not Delegated - Analgesics:  Non-Opioid Analgesic Combinations Failed - 05/25/2020  5:47 PM      Failed - This refill cannot be delegated      Failed - Valid encounter within last 12 months    Recent Outpatient Visits           2 months ago Hyperparathyroidism, secondary renal [facility])   [place], Donald E, MD   1 year ago Typical atrial flutter [facility])   [facility] [name], MD   1 year ago CKD (chronic kidney disease) stage 5, GFR less than 15 ml/min [facility])   [facility] [place] [place], Dionne Bucy, MD   1 year ago Cellulitis of lower extremity, unspecified laterality   [facility] [name], MD   1 year ago Cellulitis of lower extremity, unspecified laterality   [facility] [place], [place], [place]

## 2020-05-26 DIAGNOSIS — N186 End stage renal disease: Secondary | ICD-10-CM | POA: Diagnosis not present

## 2020-05-26 DIAGNOSIS — Z992 Dependence on renal dialysis: Secondary | ICD-10-CM | POA: Diagnosis not present

## 2020-05-26 NOTE — Telephone Encounter (Signed)
Copied from [place] [phone]. Topic: Quick Communication - Rx Refill/Question >> May 26, 2020 12:03 PM Erick Blinks wrote: Pt called and reported that her pharmacy is requesting prior authorization for butalbital-acetaminophen-caffeine [facility]) 50-325-40 MG tablet  [place] [id number], [place] [address] [place] [address] Phone: [phone] Fax: [phone]

## 2020-05-27 DIAGNOSIS — N186 End stage renal disease: Secondary | ICD-10-CM | POA: Diagnosis not present

## 2020-05-27 DIAGNOSIS — Z992 Dependence on renal dialysis: Secondary | ICD-10-CM | POA: Diagnosis not present

## 2020-05-28 DIAGNOSIS — Z992 Dependence on renal dialysis: Secondary | ICD-10-CM | POA: Diagnosis not present

## 2020-05-28 DIAGNOSIS — N186 End stage renal disease: Secondary | ICD-10-CM | POA: Diagnosis not present

## 2020-05-29 ENCOUNTER — Encounter: Payer: Self-pay | Admitting: Emergency Medicine

## 2020-05-29 ENCOUNTER — Other Ambulatory Visit: Payer: Self-pay

## 2020-05-29 ENCOUNTER — Emergency Department: Payer: Medicare Other

## 2020-05-29 ENCOUNTER — Inpatient Hospital Stay
Admission: EM | Admit: 2020-05-29 | Discharge: 2020-05-31 | DRG: 640 | Disposition: A | Discharge status: Home / Self Care | Payer: Medicare Other | Attending: Internal Medicine | Admitting: Internal Medicine

## 2020-05-29 DIAGNOSIS — Z9181 History of falling: Secondary | ICD-10-CM

## 2020-05-29 DIAGNOSIS — Z882 Allergy status to sulfonamides status: Secondary | ICD-10-CM

## 2020-05-29 DIAGNOSIS — E876 Hypokalemia: Secondary | ICD-10-CM | POA: Diagnosis not present

## 2020-05-29 DIAGNOSIS — U071 COVID-19: Secondary | ICD-10-CM | POA: Diagnosis present

## 2020-05-29 DIAGNOSIS — F32A Depression, unspecified: Secondary | ICD-10-CM | POA: Diagnosis present

## 2020-05-29 DIAGNOSIS — N186 End stage renal disease: Secondary | ICD-10-CM | POA: Diagnosis not present

## 2020-05-29 DIAGNOSIS — Z880 Allergy status to penicillin: Secondary | ICD-10-CM

## 2020-05-29 DIAGNOSIS — N2581 Secondary hyperparathyroidism of renal origin: Secondary | ICD-10-CM | POA: Diagnosis not present

## 2020-05-29 DIAGNOSIS — A0472 Enterocolitis due to Clostridium difficile, not specified as recurrent: Secondary | ICD-10-CM | POA: Diagnosis present

## 2020-05-29 DIAGNOSIS — Z992 Dependence on renal dialysis: Secondary | ICD-10-CM | POA: Diagnosis not present

## 2020-05-29 DIAGNOSIS — R197 Diarrhea, unspecified: Secondary | ICD-10-CM

## 2020-05-29 DIAGNOSIS — R112 Nausea with vomiting, unspecified: Secondary | ICD-10-CM | POA: Diagnosis not present

## 2020-05-29 DIAGNOSIS — I10 Essential (primary) hypertension: Secondary | ICD-10-CM | POA: Diagnosis not present

## 2020-05-29 DIAGNOSIS — Z800 Family history of malignant neoplasm of digestive organs: Secondary | ICD-10-CM

## 2020-05-29 DIAGNOSIS — E872 Acidosis: Secondary | ICD-10-CM | POA: Diagnosis present

## 2020-05-29 DIAGNOSIS — E785 Hyperlipidemia, unspecified: Secondary | ICD-10-CM | POA: Diagnosis present

## 2020-05-29 DIAGNOSIS — I4891 Unspecified atrial fibrillation: Secondary | ICD-10-CM | POA: Diagnosis present

## 2020-05-29 DIAGNOSIS — Z79899 Other long term (current) drug therapy: Secondary | ICD-10-CM

## 2020-05-29 DIAGNOSIS — R531 Weakness: Secondary | ICD-10-CM | POA: Diagnosis not present

## 2020-05-29 DIAGNOSIS — Z8719 Personal history of other diseases of the digestive system: Secondary | ICD-10-CM

## 2020-05-29 DIAGNOSIS — I120 Hypertensive chronic kidney disease with stage 5 chronic kidney disease or end stage renal disease: Secondary | ICD-10-CM | POA: Diagnosis not present

## 2020-05-29 DIAGNOSIS — D631 Anemia in chronic kidney disease: Secondary | ICD-10-CM | POA: Diagnosis present

## 2020-05-29 DIAGNOSIS — E860 Dehydration: Secondary | ICD-10-CM | POA: Diagnosis present

## 2020-05-29 DIAGNOSIS — Z90710 Acquired absence of both cervix and uterus: Secondary | ICD-10-CM

## 2020-05-29 DIAGNOSIS — Z8619 Personal history of other infectious and parasitic diseases: Secondary | ICD-10-CM

## 2020-05-29 DIAGNOSIS — G4700 Insomnia, unspecified: Secondary | ICD-10-CM | POA: Diagnosis present

## 2020-05-29 DIAGNOSIS — Z881 Allergy status to other antibiotic agents status: Secondary | ICD-10-CM

## 2020-05-29 DIAGNOSIS — Z8249 Family history of ischemic heart disease and other diseases of the circulatory system: Secondary | ICD-10-CM

## 2020-05-29 DIAGNOSIS — R630 Anorexia: Secondary | ICD-10-CM | POA: Diagnosis present

## 2020-05-29 DIAGNOSIS — Z888 Allergy status to other drugs, medicaments and biological substances status: Secondary | ICD-10-CM

## 2020-05-29 DIAGNOSIS — I959 Hypotension, unspecified: Secondary | ICD-10-CM | POA: Diagnosis present

## 2020-05-29 DIAGNOSIS — E871 Hypo-osmolality and hyponatremia: Secondary | ICD-10-CM | POA: Diagnosis not present

## 2020-05-29 DIAGNOSIS — A0839 Other viral enteritis: Secondary | ICD-10-CM | POA: Diagnosis present

## 2020-05-29 DIAGNOSIS — F419 Anxiety disorder, unspecified: Secondary | ICD-10-CM | POA: Diagnosis present

## 2020-05-29 DIAGNOSIS — Z9049 Acquired absence of other specified parts of digestive tract: Secondary | ICD-10-CM

## 2020-05-29 DIAGNOSIS — K219 Gastro-esophageal reflux disease without esophagitis: Secondary | ICD-10-CM | POA: Diagnosis present

## 2020-05-29 DIAGNOSIS — R5381 Other malaise: Secondary | ICD-10-CM | POA: Diagnosis not present

## 2020-05-29 LAB — BASIC METABOLIC PANEL
Anion gap: 20 — ABNORMAL HIGH (ref 5–15)
Anion gap: 21 — ABNORMAL HIGH (ref 5–15)
BUN: 45 mg/dL — ABNORMAL HIGH (ref 8–23)
BUN: 46 mg/dL — ABNORMAL HIGH (ref 8–23)
CO2: 20 mmol/L — ABNORMAL LOW (ref 22–32)
CO2: 18 mmol/L — ABNORMAL LOW (ref 22–32)
Calcium: 7.8 mg/dL — ABNORMAL LOW (ref 8.9–10.3)
Calcium: 7.8 mg/dL — ABNORMAL LOW (ref 8.9–10.3)
Chloride: 85 mmol/L — ABNORMAL LOW (ref 98–111)
Chloride: 86 mmol/L — ABNORMAL LOW (ref 98–111)
Creatinine, Ser: 5.61 mg/dL — ABNORMAL HIGH (ref 0.44–1.00)
Creatinine, Ser: 5.8 mg/dL — ABNORMAL HIGH (ref 0.44–1.00)
GFR, Estimated: 8 mL/min — ABNORMAL LOW (ref >60)
GFR, Estimated: 7 mL/min — ABNORMAL LOW (ref >60)
Glucose, Bld: 121 mg/dL — ABNORMAL HIGH (ref 70–99)
Glucose, Bld: 98 mg/dL (ref 70–99)
Potassium: 2.1 mmol/L — CL (ref 3.5–5.1)
Potassium: 2.9 mmol/L — ABNORMAL LOW (ref 3.5–5.1)
Sodium: 125 mmol/L — ABNORMAL LOW (ref 135–145)
Sodium: 125 mmol/L — ABNORMAL LOW (ref 135–145)

## 2020-05-29 LAB — LACTIC ACID, PLASMA: Lactic Acid, Venous: 1.5 mmol/L (ref 0.5–1.9)

## 2020-05-29 LAB — CBC WITH DIFFERENTIAL/PLATELET
Abs Immature Granulocytes: 0.09 10*3/uL — ABNORMAL HIGH (ref 0.00–0.07)
Basophils Absolute: 0.1 10*3/uL (ref 0.0–0.1)
Basophils Relative: 1 %
Eosinophils Absolute: 0.2 10*3/uL (ref 0.0–0.5)
Eosinophils Relative: 2 %
HCT: 26.4 % — ABNORMAL LOW (ref 36.0–46.0)
Hemoglobin: 9.3 g/dL — ABNORMAL LOW (ref 12.0–15.0)
Immature Granulocytes: 1 %
Lymphocytes Relative: 14 %
Lymphs Abs: 1.5 10*3/uL (ref 0.7–4.0)
MCH: 32 pg (ref 26.0–34.0)
MCHC: 35.2 g/dL (ref 30.0–36.0)
MCV: 90.7 fL (ref 80.0–100.0)
Monocytes Absolute: 1 10*3/uL (ref 0.1–1.0)
Monocytes Relative: 9 %
Neutro Abs: 8.2 10*3/uL — ABNORMAL HIGH (ref 1.7–7.7)
Neutrophils Relative %: 73 %
Platelets: 359 10*3/uL (ref 150–400)
RBC: 2.91 MIL/uL — ABNORMAL LOW (ref 3.87–5.11)
RDW: 15.9 % — ABNORMAL HIGH (ref 11.5–15.5)
WBC: 11 10*3/uL — ABNORMAL HIGH (ref 4.0–10.5)
nRBC: 0 % (ref 0.0–0.2)

## 2020-05-29 LAB — SARS CORONAVIRUS 2 BY RT PCR (HOSPITAL ORDER, PERFORMED IN ~~LOC~~ HOSPITAL LAB): SARS Coronavirus 2: POSITIVE — AB

## 2020-05-29 LAB — HEPATIC FUNCTION PANEL
ALT: 16 U/L (ref 0–44)
AST: 19 U/L (ref 15–41)
Albumin: 2.4 g/dL — ABNORMAL LOW (ref 3.5–5.0)
Alkaline Phosphatase: 122 U/L (ref 38–126)
Bilirubin, Direct: <0.1 mg/dL (ref 0.0–0.2)
Total Bilirubin: 0.6 mg/dL (ref 0.3–1.2)
Total Protein: 5.2 g/dL — ABNORMAL LOW (ref 6.5–8.1)

## 2020-05-29 LAB — FERRITIN: Ferritin: 934 ng/mL — ABNORMAL HIGH (ref 11–307)

## 2020-05-29 LAB — TROPONIN I (HIGH SENSITIVITY): Troponin I (High Sensitivity): 25 ng/L — ABNORMAL HIGH (ref <18)

## 2020-05-29 LAB — FIBRIN DERIVATIVES D-DIMER (ARMC ONLY): Fibrin derivatives D-dimer (ARMC): 829.4 ng/mL (FEU) — ABNORMAL HIGH (ref 0.00–499.00)

## 2020-05-29 LAB — MAGNESIUM: Magnesium: 1.5 mg/dL — ABNORMAL LOW (ref 1.7–2.4)

## 2020-05-29 MED ORDER — BUPROPION HCL 75 MG PO TABS
75.0000 mg | ORAL_TABLET | Freq: Two times a day (BID) | ORAL | Status: DC
Start: 2020-05-29 — End: 2020-05-31
  Administered 2020-05-29 – 2020-05-31 (×4): 75 mg via ORAL
  Filled 2020-05-29 (×6): qty 1

## 2020-05-29 MED ORDER — ONDANSETRON HCL 4 MG/2ML IJ SOLN: 4.0000 mg | Freq: Four times a day (QID) | INTRAMUSCULAR | Status: DC | PRN

## 2020-05-29 MED ORDER — MAGNESIUM SULFATE 2 GM/50ML IV SOLN
2.0000 g | Freq: Once | INTRAVENOUS | Status: AC
  Administered 2020-05-29: 2 g via INTRAVENOUS
  Filled 2020-05-29: qty 50

## 2020-05-29 MED ORDER — BUTALBITAL-APAP-CAFFEINE 50-325-40 MG PO TABS
1.0000 | ORAL_TABLET | Freq: Four times a day (QID) | ORAL | Status: DC | PRN
  Administered 2020-05-29 – 2020-05-30 (×2): 1 via ORAL
  Filled 2020-05-29 (×2): qty 1

## 2020-05-29 MED ORDER — HEPARIN SODIUM (PORCINE) 5000 UNIT/ML IJ SOLN
5000.0000 [IU] | Freq: Three times a day (TID) | INTRAMUSCULAR | Status: DC
  Administered 2020-05-29 – 2020-05-31 (×5): 5000 [IU] via SUBCUTANEOUS
  Filled 2020-05-29 (×5): qty 1

## 2020-05-29 MED ORDER — ZOLPIDEM TARTRATE 5 MG PO TABS
5.0000 mg | ORAL_TABLET | Freq: Every evening | ORAL | Status: DC | PRN
  Administered 2020-05-29 – 2020-05-30 (×2): 5 mg via ORAL
  Filled 2020-05-29 (×2): qty 1

## 2020-05-29 MED ORDER — ZINC SULFATE 220 (50 ZN) MG PO CAPS
220.0000 mg | ORAL_CAPSULE | Freq: Every day | ORAL | Status: DC
  Administered 2020-05-29 – 2020-05-31 (×3): 220 mg via ORAL
  Filled 2020-05-29 (×3): qty 1

## 2020-05-29 MED ORDER — LORAZEPAM 1 MG PO TABS
1.0000 mg | ORAL_TABLET | Freq: Four times a day (QID) | ORAL | Status: DC | PRN
  Administered 2020-05-29 – 2020-05-30 (×2): 1 mg via ORAL
  Filled 2020-05-29 (×2): qty 1

## 2020-05-29 MED ORDER — GUAIFENESIN-DM 100-10 MG/5ML PO SYRP: 10.0000 mL | ORAL_SOLUTION | ORAL | Status: DC | PRN

## 2020-05-29 MED ORDER — LOSARTAN POTASSIUM 50 MG PO TABS
100.0000 mg | ORAL_TABLET | Freq: Every day | ORAL | Status: DC
  Administered 2020-05-29 – 2020-05-30 (×2): 100 mg via ORAL
  Filled 2020-05-29 (×2): qty 2

## 2020-05-29 MED ORDER — PANTOPRAZOLE SODIUM 40 MG PO TBEC
40.0000 mg | DELAYED_RELEASE_TABLET | Freq: Every day | ORAL | Status: DC
  Administered 2020-05-29 – 2020-05-31 (×3): 40 mg via ORAL
  Filled 2020-05-29 (×3): qty 1

## 2020-05-29 MED ORDER — ACETAMINOPHEN 325 MG PO TABS: 325.0000 mg | ORAL_TABLET | Freq: Four times a day (QID) | ORAL | Status: DC | PRN

## 2020-05-29 MED ORDER — POTASSIUM CHLORIDE 10 MEQ/100ML IV SOLN
10.0000 meq | Freq: Once | INTRAVENOUS | Status: AC
  Administered 2020-05-30: 10 meq via INTRAVENOUS
  Filled 2020-05-29: qty 100

## 2020-05-29 MED ORDER — ONDANSETRON HCL 4 MG PO TABS: 4.0000 mg | ORAL_TABLET | Freq: Four times a day (QID) | ORAL | Status: DC | PRN

## 2020-05-29 MED ORDER — POTASSIUM CHLORIDE IN NACL 20-0.9 MEQ/L-% IV SOLN
INTRAVENOUS | Status: DC
  Filled 2020-05-29: qty 1000

## 2020-05-29 MED ORDER — CARVEDILOL 25 MG PO TABS
25.0000 mg | ORAL_TABLET | Freq: Two times a day (BID) | ORAL | Status: DC
  Administered 2020-05-30 – 2020-05-31 (×3): 25 mg via ORAL
  Filled 2020-05-29 (×3): qty 1

## 2020-05-29 MED ORDER — ASCORBIC ACID 500 MG PO TABS
500.0000 mg | ORAL_TABLET | Freq: Every day | ORAL | Status: DC
  Administered 2020-05-29 – 2020-05-31 (×3): 500 mg via ORAL
  Filled 2020-05-29 (×3): qty 1

## 2020-05-29 MED ORDER — HYDRALAZINE HCL 50 MG PO TABS: 50.0000 mg | ORAL_TABLET | Freq: Three times a day (TID) | ORAL | Status: DC | PRN

## 2020-05-29 MED ORDER — HYDROCOD POLST-CPM POLST ER 10-8 MG/5ML PO SUER: 5.0000 mL | Freq: Two times a day (BID) | ORAL | Status: DC | PRN

## 2020-05-29 MED ORDER — POTASSIUM CHLORIDE CRYS ER 20 MEQ PO TBCR
40.0000 meq | EXTENDED_RELEASE_TABLET | Freq: Once | ORAL | Status: AC
  Administered 2020-05-29: 40 meq via ORAL
  Filled 2020-05-29: qty 2

## 2020-05-29 MED ORDER — CALCIUM ACETATE (PHOS BINDER) 667 MG PO CAPS
667.0000 mg | ORAL_CAPSULE | Freq: Three times a day (TID) | ORAL | Status: DC
  Administered 2020-05-30 – 2020-05-31 (×4): 667 mg via ORAL
  Filled 2020-05-29 (×6): qty 1

## 2020-05-29 MED ORDER — LACTATED RINGERS IV BOLUS
500.0000 mL | Freq: Once | INTRAVENOUS | Status: AC
  Administered 2020-05-29: 500 mL via INTRAVENOUS

## 2020-05-29 MED ORDER — POTASSIUM CHLORIDE 10 MEQ/100ML IV SOLN
10.0000 meq | INTRAVENOUS | Status: DC
  Administered 2020-05-29: 10 meq via INTRAVENOUS
  Filled 2020-05-29 (×4): qty 100

## 2020-05-29 NOTE — ED Provider Notes (Signed)
[facility] Emergency Department Provider Note   ____________________________________________   Event Date/Time   First MD Initiated Contact with Patient 05/29/20 1601     (approximate)  I have reviewed the triage vital signs and the nursing notes.   HISTORY  Chief Complaint Weakness    HPI Amy [name] is a 70 y.o. female with past medical history of hypertension, ESRD on PD, anemia of chronic disease, and GERD who presents to the ED with weakness.  Patient was recently admitted to the hospital for hip fracture and has been recovering at home since then.  About a week ago she developed some nausea and vomiting along with diarrhea and poor appetite.  She has not had any fevers and denies any cough, chest pain, or shortness of breath.  She has been feeling progressively weaker and missed a couple days of peritoneal dialysis this week because she was too weak to hook herself up.  She called a friend today saying that she was feeling very weak and that friend called EMS to bring her to the hospital.        Past Medical History:  Diagnosis Date  . Anemia    a. 08/2016 Guaiac + stool. EGD w/ gastritis.  . Anxiety   . CKD (chronic kidney disease), stage III ([name])   . Closed fracture of shaft of left ulna 11/27/2018  . Colon polyp    a. 08/2016 Colonoscopy.  . Gastritis    a. 08/2016 EGD: Gastritis-->PPI.  [name] GERD (gastroesophageal reflux disease)   . History of chicken pox   . History of measles as a child   . Hypertension   . Hyponatremia    a. 08/2016 in setting of HCTZ Rx.  . Mesenteric lymphadenopathy 04/08/2017  . Multiple thyroid nodules     Patient Active Problem List   Diagnosis Date Noted  . Surgery, elective   . S/P ORIF (open reduction internal fixation) fracture   . Closed right hip fracture, initial encounter ([place]) 04/22/2020  . Hypotension 12/16/2019  . ESRD on dialysis ([place]) 12/16/2019  . Depression with anxiety 12/16/2019  .  Hypomagnesemia 12/16/2019  . Atrial flutter ([name])   . Atrial fibrillation with RVR ([name]) 04/03/2019  . Benign essential HTN 04/03/2019  . HLD (hyperlipidemia) 04/03/2019  . End stage renal disease ([place]) 12/13/2018  . Itching 11/08/2018  . Hyperparathyroidism, secondary renal ([place]) 09/13/2017  . Ground glass opacity present on imaging of lung 04/08/2017  . Osteoarthritis of knee 12/29/2016  . Normocytic anemia 10/14/2016  . Intractable episodic headache   . Gastritis   . History of adenomatous polyp of colon 09/07/2016  . Iron deficiency anemia 09/05/2016  . Multiple thyroid nodules 07/04/2015  . Right thyroid nodule 06/27/2015  . Allergic rhinitis, seasonal 12/03/2014  . Fever blister 12/03/2014  . Herniated lumbar intervertebral disc 12/03/2014  . Hypokalemia 12/03/2014  . Recurrent sinus infections 12/03/2014  . Insomnia 11/05/2014  . Acquired scoliosis 06/26/2014  . Intervertebral disc stenosis of neural canal of lumbar region 06/20/2014  . Lumbar facet arthropathy 05/21/2014  . Degenerative spondylolisthesis 03/12/2014  . OP (osteoporosis) 08/10/2013  . Idiopathic peripheral neuropathy 03/24/2009  . Edema 12/27/2007  . Headache, tension-type 10/25/2006  . Essential hypertension 10/24/2006  . Anxiety 05/03/1998  . Esophageal reflux 05/03/1998    Past Surgical History:  Procedure Laterality Date  . ABDOMINAL HYSTERECTOMY  2003   due to heavy periods and clotting during menses  . BREAST LUMPECTOMY Left 1990  . CHOLECYSTECTOMY  2010  . COLONOSCOPY WITH PROPOFOL N/A 09/07/2016   Procedure: COLONOSCOPY WITH PROPOFOL;  Surgeon: Lucilla Lame, MD;  Location: [facility] ENDOSCOPY;  Service: Endoscopy;  Laterality: N/A;  . DIALYSIS/PERMA CATHETER INSERTION N/A 11/16/2018   Procedure: DIALYSIS/PERMA CATHETER INSERTION;  Surgeon: Algernon Huxley, MD;  Location: [place] CV LAB;  Service: Cardiovascular;  Laterality: N/A;  . DIALYSIS/PERMA CATHETER REMOVAL N/A 08/16/2019   Procedure:  DIALYSIS/PERMA CATHETER REMOVAL;  Surgeon: Algernon Huxley, MD;  Location: [place] CV LAB;  Service: Cardiovascular;  Laterality: N/A;  . DILATION AND CURETTAGE OF UTERUS  1985  . ESOPHAGOGASTRODUODENOSCOPY (EGD) WITH PROPOFOL N/A 09/07/2016   Procedure: ESOPHAGOGASTRODUODENOSCOPY (EGD) WITH PROPOFOL;  Surgeon: Lucilla Lame, MD;  Location: [facility] ENDOSCOPY;  Service: Endoscopy;  Laterality: N/A;  . GALLBLADDER SURGERY  2012  . INTRAMEDULLARY (IM) NAIL INTERTROCHANTERIC Right 04/23/2020   Procedure: INTRAMEDULLARY (IM) NAIL INTERTROCHANTRIC;  Surgeon: Thornton Park, MD;  Location: [facility] ORS;  Service: Orthopedics;  Laterality: Right;  . KNEE SURGERY     Dr. Tamala Julian  . MRI, Lumbar spine  07/23/2009   Multilevel annular bulge and disc protrusion at L2-L3, L3-L4, L4-L5, and L5-S1  . TONSILLECTOMY    . TONSILLECTOMY AND ADENOIDECTOMY  1970  . UPPER GI ENDOSCOPY  03/02/2006   H. Pylori negative; Dr. Allen Norris    Prior to Admission medications   Medication Sig Start Date End Date Taking? Authorizing Provider  buPROPion (WELLBUTRIN) 75 MG tablet TAKE 1 TABLET(75 MG) BY MOUTH TWICE DAILY 02/11/20   [name], MD  butalbital-acetaminophen-caffeine (FIORICET) [phone] MG tablet TAKE 1 TO 2 TABLETS BY MOUTH EVERY 4 HOURS AS NEEDED FOR HEADACHE 05/25/20   [name], MD  calcium acetate (PHOSLO) 667 MG capsule Take 1 capsule by mouth 3 (three) times daily with meals. 04/11/20   [provider]  carvedilol (COREG) 25 MG tablet Take 1 tablet (25 mg total) by mouth 2 (two) times daily with a meal. 03/24/20   [name], MD  cloNIDine (CATAPRES) 0.2 MG tablet Take 1 tablet (0.2 mg total) by mouth 2 (two) times daily. 12/17/19 01/16/20  Max Sane, MD  Epoetin Alfa (EPOGEN IJ) Inject as directed.    [provider]  esomeprazole (NEXIUM) 20 MG capsule Take 20 mg by mouth daily. 02/20/19   [provider]  hydrALAZINE (APRESOLINE) 50 MG tablet Take 1 tablet (50 mg total)  by mouth 3 (three) times daily as needed. For SBP > 160 mm hg 12/17/19 01/16/20  Max Sane, MD  LORazepam (ATIVAN) 1 MG tablet TAKE 1 TABLET(1 MG) BY MOUTH TWICE DAILY AS NEEDED 03/17/20   [name], MD  losartan (COZAAR) 100 MG tablet Take 100 mg by mouth at bedtime. 02/20/19   [provider]  sennosides-docusate sodium (SENOKOT-S) 8.6-50 MG tablet Take 2 tablets by mouth 2 (two) times daily as needed for constipation. 04/26/20   Max Sane, MD  valACYclovir (VALTREX) 1000 MG tablet Take 1,000 mg by mouth 2 (two) times daily as needed. 09/11/19   [provider]  zolpidem (AMBIEN) 10 MG tablet TAKE 1/2 TO 1 TABLET(5 TO 10 MG) BY MOUTH AT BEDTIME AS NEEDED 05/16/20   [name], MD    Allergies Cephalosporins, Clarithromycin, Lunesta  [eszopiclone], Penicillin v, and Sulfa antibiotics  Family History  Problem Relation Age of Onset  . Pancreatic cancer Father   . Congestive Heart Failure Maternal Grandmother   . Cancer Paternal Grandfather   . Heart disease Maternal Uncle  Social History Social History   Tobacco Use  . Smoking status: Never Smoker  . Smokeless tobacco: Never Used  Vaping Use  . Vaping Use: Never used  Substance Use Topics  . Alcohol use: Yes    Alcohol/week: 0.0 - 2.0 standard drinks  . Drug use: No    Review of Systems  Constitutional: No fever/chills.  Positive for generalized weakness. Eyes: No visual changes. ENT: No sore throat. Cardiovascular: Denies chest pain. Respiratory: Denies shortness of breath. Gastrointestinal: No abdominal pain.  Positive for nausea, vomiting, and diarrhea.  No constipation. Genitourinary: Negative for dysuria. Musculoskeletal: Negative for back pain. Skin: Negative for rash. Neurological: Negative for headaches, focal weakness or numbness.  ____________________________________________   PHYSICAL EXAM:  VITAL SIGNS: ED Triage Vitals  Enc Vitals Group     BP 05/29/20 1211 114/72      Pulse Rate 05/29/20 1211 79     Resp 05/29/20 1211 16     Temp 05/29/20 1211 97.9 F (36.6 C)     Temp Source 05/29/20 1211 Oral     SpO2 05/29/20 1211 100 %     Weight 05/29/20 1212 105 lb (47.6 kg)     Height 05/29/20 1212 5\' 4"  (1.626 m)     Head Circumference --      Peak Flow --      Pain Score 05/29/20 1212 0     Pain Loc --      Pain Edu? --      Excl. in [place]? --     Constitutional: Alert and oriented. Eyes: Conjunctivae are normal. Head: Atraumatic. Nose: No congestion/rhinnorhea. Mouth/Throat: Mucous membranes are dry. Neck: Normal ROM Cardiovascular: Normal rate, regular rhythm. Grossly normal heart sounds. Respiratory: Normal respiratory effort.  No retractions. Lungs CTAB. Gastrointestinal: Soft and nontender. No distention.  PD catheter in place with no erythema, warmth, or drainage. Genitourinary: deferred Musculoskeletal: No lower extremity tenderness nor edema. Neurologic:  Normal speech and language. No gross focal neurologic deficits are appreciated. Skin:  Skin is warm, dry and intact. No rash noted. Psychiatric: Mood and affect are normal. Speech and behavior are normal.  ____________________________________________   LABS (all labs ordered are listed, but only abnormal results are displayed)  Labs Reviewed  SARS CORONAVIRUS 2 BY RT PCR (HOSPITAL ORDER, [place] [facility]) - Abnormal; Notable for the following components:      Result Value   SARS Coronavirus 2 POSITIVE (*)    All other components within normal limits  BASIC METABOLIC PANEL - Abnormal; Notable for the following components:   Sodium 125 (*)    Potassium 2.1 (*)    Chloride 85 (*)    CO2 20 (*)    Glucose, Bld 121 (*)    BUN 45 (*)    Creatinine, Ser 5.61 (*)    Calcium 7.8 (*)    GFR, Estimated 8 (*)    Anion gap 20 (*)    All other components within normal limits  HEPATIC FUNCTION PANEL - Abnormal; Notable for the following components:   Total Protein 5.2 (*)     Albumin 2.4 (*)    All other components within normal limits  CBC WITH DIFFERENTIAL/PLATELET - Abnormal; Notable for the following components:   WBC 11.0 (*)    RBC 2.91 (*)    Hemoglobin 9.3 (*)    HCT 26.4 (*)    RDW 15.9 (*)    Neutro Abs 8.2 (*)    Abs Immature Granulocytes 0.09 (*)  All other components within normal limits  MAGNESIUM - Abnormal; Notable for the following components:   Magnesium 1.5 (*)    All other components within normal limits  TROPONIN I (HIGH SENSITIVITY) - Abnormal; Notable for the following components:   Troponin I (High Sensitivity) 25 (*)    All other components within normal limits  LACTIC ACID, PLASMA  CBC  URINALYSIS, COMPLETE (UACMP) WITH MICROSCOPIC  FIBRIN DERIVATIVES D-DIMER ([facility] ONLY)  FERRITIN  C-REACTIVE PROTEIN   ____________________________________________  EKG  ED ECG REPORT I, Blake Divine, the attending physician, personally viewed and interpreted this ECG.   Date: 05/29/2020  EKG Time: 12:12  Rate: 80  Rhythm: normal sinus rhythm  Axis: Normal  Intervals:none  ST&T Change: ST depressions diffusely with U wave   PROCEDURES  Procedure(s) performed (including Critical Care):  .Critical Care Performed by: Blake Divine, MD Authorized by: Blake Divine, MD   Critical care provider statement:    Critical care time (minutes):  45   Critical care time was exclusive of:  Separately billable procedures and treating other patients and teaching time   Critical care was necessary to treat or prevent imminent or life-threatening deterioration of the following conditions:  Metabolic crisis   Critical care was time spent personally by me on the following activities:  Discussions with consultants, evaluation of patient's response to treatment, examination of patient, ordering and performing treatments and interventions, ordering and review of laboratory studies, ordering and review of radiographic studies, pulse oximetry,  re-evaluation of patient's condition, obtaining history from patient or surrogate and review of old charts   I assumed direction of critical care for this patient from another provider in my specialty: no     Care discussed with: admitting provider       ____________________________________________   INITIAL IMPRESSION / ASSESSMENT AND PLAN / ED COURSE       70 year old female with past medical history of hypertension, ESRD on PD, anemia of chronic disease, and GERD who presents to the ED with progressive generalized weakness after developing nausea, vomiting, and diarrhea earlier in the week.  Patient tested positive for COVID-19 from triage, which would explain her vomiting and diarrhea.  She denies any respiratory symptoms and is maintaining O2 sats on room air.  Labs revealed marked hypokalemia at 2.1 along with hypomagnesemia, associated changes noted on EKG with ST depressions and U wave.  She was given initial oral repletion and we will also replete with IV potassium as well as IV magnesium.  Given hypokalemia with EKG changes, we will discuss with hospitalist for admission.  Chest x-ray reviewed by me and shows no infiltrate, edema, or effusion.  Case discussed with hospitalist for admission.      ____________________________________________   FINAL CLINICAL IMPRESSION(S) / ED DIAGNOSES  Final diagnoses:  Hypokalemia  Nausea vomiting and diarrhea  COVID-19     ED Discharge Orders    None       Note:  This document was prepared using Dragon voice recognition software and may include unintentional dictation errors.   Blake Divine, MD 05/29/20 [phone]

## 2020-05-29 NOTE — ED Triage Notes (Signed)
Pt comes into the ED via [facility] from home c/o generalized weakness.  Pt had hip replacement 4 weeks ago and then states she had a "virus" last week.  Pt states that since the virus last week she has had increased weakness and decreased appetite.  Pt denies being tested for COVID but states she had N/V.  Pt is alert and oriented x 4, even respirations, and in NAd.

## 2020-05-29 NOTE — H&P (Signed)
History and Physical   Amy [name]:[id number] DOB: 1950/05/14 DOA: 05/29/2020  PCP: [name], MD  Outpatient Specialists: Dr. Murlean Iba, nephrologist Patient coming from: Home  I have personally briefly reviewed patient's old medical records in [place].  Chief Concern: Nausea, vomiting, diarrhea for 1 week  HPI: Amy [name] is a 70 y.o. female with medical history significant for ESRD on PD and previously on HD, hypertension, depression/anxiety, history of right hip fracture secondary to mechanical fall in December 2021, presented to the ED for chief concern of N/V/D for 1 week.  She denies shortness of breath, chest pain, palpitations, fever, abdominal pain.  She denies changes to her diet.  She endorses that at baseline she occasionally has nausea and vomiting ever since she started dialysis. She states however this time it has been worse.She states her diarrhea is brown and green. She denies blood.    Social History: lives with her husband. 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 She denies tobacco, recreational drug use. She infrequently drinks etoh.   Allergies: sulfa drugs causes swelling  Vaccinations: she is vaccinated for covid x 2   ROS: Constitutional: no weight change, no fever ENT/Mouth: no sore throat, no rhinorrhea Eyes: no eye pain, no vision changes Cardiovascular: no chest pain, no dyspnea,  no edema, no palpitations Respiratory: no cough, no sputum, no wheezing Gastrointestinal: + nausea, + vomiting, + diarrhea, no constipation Genitourinary: no urinary incontinence, no dysuria, no hematuria Musculoskeletal: no arthralgias, no myalgias Skin: no skin lesions, no pruritus, Neuro: + weakness, no loss of consciousness, no syncope Psych: no anxiety, no depression, + decrease appetite Heme/Lymph: no bruising, no bleeding  ED Course: Discussed with ED provider, patient requiring hospitalization due to severe  hypokalemia.  Assessment/Plan  Principal Problem:   Hypokalemia Active Problems:   Anxiety   Essential hypertension   Benign essential HTN   HLD (hyperlipidemia)   ESRD on dialysis ([place])   Gastroenteritis due to COVID-19 virus   Hypokalemia secondary to GI loss for vomiting and diarrhea Gastroenteritis due to COVID-19 infection -Status post 40 mEq potassium p.o. per ED provider -ED provider ordered 4 doses of potassium 10 mEq -Repeat BMP at approximately 2300 -Replace as appropriate -Repeat EKG for 05/30/2018 2 in the at [facility]  -Patient is not requiring oxygenation at this time and would benefit from discharge home for symptomatic management if potassium is replaced  End-stage renal disease on peritoneal dialysis-nephrology has been consulted and is aware  Hypertension-resumed home carvedilol 25 mg twice daily, hydralazine 50 mg as needed for SBP greater than 160, losartan 100 mg nightly  Anxiety/depression-resumed home bupropion 75 mg daily, lorazepam 1 mg p.o. twice daily as needed  Insomnia-resumed home zolpidem  Chart reviewed.   Hospital course from 04/22/2020 to 04/28/2020: History of right hip fracture secondary to mechanical fall status post right intramedullary fixation on 04/23/2020.  She was discharged home on 04/28/2020 with home health PT and OT.  DVT prophylaxis: Heparin Code Status: full code  Diet: Heart healthy Family Communication: Updated daughter at bedside Disposition Plan: Pending clinical course Consults called: Nephrology Admission status: Observation to progressive cardiac  Past Medical History:  Diagnosis Date  . Anemia    a. 08/2016 Guaiac + stool. EGD w/ gastritis.  . Anxiety   . CKD (chronic kidney disease), stage III ([place])   . Closed fracture of shaft of left ulna 11/27/2018  . Colon polyp    a. 08/2016 Colonoscopy.  . Gastritis  a. 08/2016 EGD: Gastritis-->PPI.  [name] GERD (gastroesophageal reflux disease)   . History of chicken pox   .  History of measles as a child   . Hypertension   . Hyponatremia    a. 08/2016 in setting of HCTZ Rx.  . Mesenteric lymphadenopathy 04/08/2017  . Multiple thyroid nodules    Past Surgical History:  Procedure Laterality Date  . ABDOMINAL HYSTERECTOMY  2003   due to heavy periods and clotting during menses  . BREAST LUMPECTOMY Left 1990  . CHOLECYSTECTOMY  2010  . COLONOSCOPY WITH PROPOFOL N/A 09/07/2016   Procedure: COLONOSCOPY WITH PROPOFOL;  Surgeon: Lucilla Lame, MD;  Location: [facility] ENDOSCOPY;  Service: Endoscopy;  Laterality: N/A;  . DIALYSIS/PERMA CATHETER INSERTION N/A 11/16/2018   Procedure: DIALYSIS/PERMA CATHETER INSERTION;  Surgeon: Algernon Huxley, MD;  Location: [place] CV LAB;  Service: Cardiovascular;  Laterality: N/A;  . DIALYSIS/PERMA CATHETER REMOVAL N/A 08/16/2019   Procedure: DIALYSIS/PERMA CATHETER REMOVAL;  Surgeon: Algernon Huxley, MD;  Location: [place] CV LAB;  Service: Cardiovascular;  Laterality: N/A;  . DILATION AND CURETTAGE OF UTERUS  1985  . ESOPHAGOGASTRODUODENOSCOPY (EGD) WITH PROPOFOL N/A 09/07/2016   Procedure: ESOPHAGOGASTRODUODENOSCOPY (EGD) WITH PROPOFOL;  Surgeon: Lucilla Lame, MD;  Location: [facility] ENDOSCOPY;  Service: Endoscopy;  Laterality: N/A;  . GALLBLADDER SURGERY  2012  . INTRAMEDULLARY (IM) NAIL INTERTROCHANTERIC Right 04/23/2020   Procedure: INTRAMEDULLARY (IM) NAIL INTERTROCHANTRIC;  Surgeon: Thornton Park, MD;  Location: [facility] ORS;  Service: Orthopedics;  Laterality: Right;  . KNEE SURGERY     Dr. Tamala Julian  . MRI, Lumbar spine  07/23/2009   Multilevel annular bulge and disc protrusion at L2-L3, L3-L4, L4-L5, and L5-S1  . TONSILLECTOMY    . TONSILLECTOMY AND ADENOIDECTOMY  1970  . UPPER GI ENDOSCOPY  03/02/2006   H. Pylori negative; Dr. Allen Norris   Social History:  reports that she has never smoked. She has never used smokeless tobacco. She reports current alcohol use. She reports that she does not use drugs.  Allergies  Allergen Reactions  .  Cephalosporins Hives and Swelling  . Clarithromycin   . Lunesta  [Eszopiclone]     Heart racing  . Penicillin V     Has patient had a PCN reaction causing immediate rash, facial/tongue/throat swelling, SOB or lightheadedness with hypotension: Yes Has patient had a PCN reaction causing severe rash involving mucus membranes or skin necrosis: No Has patient had a PCN reaction that required hospitalization No Has patient had a PCN reaction occurring within the last 10 years: No If all of the above answers are "NO", then may proceed with Cephalosporin use.  . Sulfa Antibiotics    Family History  Problem Relation Age of Onset  . Pancreatic cancer Father   . Congestive Heart Failure Maternal Grandmother   . Cancer Paternal Grandfather   . Heart disease Maternal Uncle    Family history: Family history reviewed and not pertinent  Prior to Admission medications   Medication Sig Start Date End Date Taking? Authorizing Provider  buPROPion (WELLBUTRIN) 75 MG tablet TAKE 1 TABLET(75 MG) BY MOUTH TWICE DAILY 02/11/20   [name], MD  butalbital-acetaminophen-caffeine (FIORICET) [id number] MG tablet TAKE 1 TO 2 TABLETS BY MOUTH EVERY 4 HOURS AS NEEDED FOR HEADACHE 05/25/20   [name], MD  calcium acetate (PHOSLO) 667 MG capsule Take 1 capsule by mouth 3 (three) times daily with meals. 04/11/20   [provider]  carvedilol (COREG) 25 MG tablet Take 1 tablet (25 mg  total) by mouth 2 (two) times daily with a meal. 03/24/20   [name], MD  cloNIDine (CATAPRES) 0.2 MG tablet Take 1 tablet (0.2 mg total) by mouth 2 (two) times daily. 12/17/19 01/16/20  Max Sane, MD  Epoetin Alfa (EPOGEN IJ) Inject as directed.    [provider]  esomeprazole (NEXIUM) 20 MG capsule Take 20 mg by mouth daily. 02/20/19   [provider]  hydrALAZINE (APRESOLINE) 50 MG tablet Take 1 tablet (50 mg total) by mouth 3 (three) times daily as needed. For SBP > 160 mm hg 12/17/19  01/16/20  Max Sane, MD  LORazepam (ATIVAN) 1 MG tablet TAKE 1 TABLET(1 MG) BY MOUTH TWICE DAILY AS NEEDED 03/17/20   [name], MD  losartan (COZAAR) 100 MG tablet Take 100 mg by mouth at bedtime. 02/20/19   [provider]  sennosides-docusate sodium (SENOKOT-S) 8.6-50 MG tablet Take 2 tablets by mouth 2 (two) times daily as needed for constipation. 04/26/20   Max Sane, MD  valACYclovir (VALTREX) 1000 MG tablet Take 1,000 mg by mouth 2 (two) times daily as needed. 09/11/19   [provider]  zolpidem (AMBIEN) 10 MG tablet TAKE 1/2 TO 1 TABLET(5 TO 10 MG) BY MOUTH AT BEDTIME AS NEEDED 05/16/20   [name], MD   Physical Exam: Vitals:   05/29/20 1521 05/29/20 1923 05/29/20 1930 05/29/20 2007  BP: 116/77  (!) 144/76 (!) 152/74  Pulse: 81  72 68  Resp: 18 16 16 15   Temp:   98 F (36.7 C) 98.4 F (36.9 C)  TempSrc:   Oral Oral  SpO2: 100%  100% 100%  Weight:    47.4 kg  Height:    5\' 4"  (1.626 m)   Constitutional: appears younger than chronological age, frail, NAD, calm, comfortable Eyes: PERRL, lids and conjunctivae normal ENMT: Mucous membranes are dry. Posterior pharynx clear of any exudate or lesions. Age-appropriate dentition. Hearing appropriate Neck: normal, supple, no masses, no thyromegaly Respiratory: clear to auscultation bilaterally, no wheezing, no crackles. Normal respiratory effort. No accessory muscle use.  Cardiovascular: Regular rate and rhythm, no murmurs / rubs / gallops. No extremity edema. 2+ pedal pulses. No carotid bruits.  Abdomen: no tenderness, no masses palpated, no hepatosplenomegaly. Bowel sounds positive.  Peritoneal dialysis catheter present and negative for visual evidence of infection Musculoskeletal: no clubbing / cyanosis. No joint deformity upper and lower extremities. Good ROM, no contractures, no atrophy. Normal muscle tone.  Skin: no rashes, lesions, ulcers. No induration Neurologic: Sensation intact. Strength 5/5 in  all 4.  Psychiatric: Normal judgment and insight. Alert and oriented x 3. Normal mood.   EKG: independently reviewed, showing rhythm rate of 80, QTc 459.  Some ST depression different from previous EKG.  Chest x-ray on Admission: I personally reviewed and I agree with radiologist reading as below.  DG Chest Portable 1 View  Result Date: 05/29/2020 CLINICAL DATA:  Weakness EXAM: PORTABLE CHEST 1 VIEW COMPARISON:  12/16/2019 and prior. FINDINGS: No pneumothorax or pleural effusion. Cardiomediastinal silhouette within normal limits. Hazy appearance of the peripheral lung fields. No acute osseous abnormality. IMPRESSION: Hazy appearance of the peripheral lung fields may be artifactual however cannot exclude atypical infection. Electronically Signed   By: Primitivo Gauze M.D.   On: 05/29/2020 16:41    Labs on Admission: I have personally reviewed following labs  CBC: Recent Labs  Lab 05/29/20 1215  WBC 11.0*  NEUTROABS 8.2*  HGB 9.3*  HCT 26.4*  MCV 90.7  PLT 540   Basic Metabolic Panel: Recent Labs  Lab 05/29/20 1215 05/29/20 2043  NA 125* 125*  K 2.1* 2.9*  CL 85* 86*  CO2 20* 18*  GLUCOSE 121* 98  BUN 45* 46*  CREATININE 5.61* 5.80*  CALCIUM 7.8* 7.8*  MG 1.5*  --    GFR: Estimated Creatinine Clearance: 6.7 mL/min (A) (by C-G formula based on SCr of 5.8 mg/dL (H)).  Liver Function Tests: Recent Labs  Lab 05/29/20 1215  AST 19  ALT 16  ALKPHOS 122  BILITOT 0.6  PROT 5.2*  ALBUMIN 2.4*   Anemia Panel: Recent Labs    05/29/20 1834  FERRITIN 934*   Urine analysis:    Component Value Date/Time   COLORURINE STRAW (A) 12/16/2019 1742   APPEARANCEUR HAZY (A) 12/16/2019 1742   LABSPEC 1.010 12/16/2019 1742   PHURINE 5.0 12/16/2019 1742   GLUCOSEU NEGATIVE 12/16/2019 1742   HGBUR NEGATIVE 12/16/2019 1742   BILIRUBINUR NEGATIVE 12/16/2019 1742   BILIRUBINUR negative 08/31/2018 1132   KETONESUR NEGATIVE 12/16/2019 1742   PROTEINUR NEGATIVE 12/16/2019 1742    UROBILINOGEN 0.2 08/31/2018 1132   NITRITE NEGATIVE 12/16/2019 1742   LEUKOCYTESUR TRACE (A) 12/16/2019 1742   Georgiann Neider N Samari Gorby D.O. [facility]  If 7PM-7AM, please contact overnight-coverage provider If 7AM-7PM, please contact day coverage provider [email or web address]  05/29/2020, 11:42 PM

## 2020-05-29 NOTE — ED Triage Notes (Signed)
First Nurse Note:  Arrives from home via [facility].  C/O generalized weakness.  Has history of peritoneal dialysis and home, recent hip fracture.  VS wnl.

## 2020-05-29 NOTE — Progress Notes (Signed)
[facility]  ROUNDING NOTE   Subjective:   [name] was admitted to [facility] on 05/29/2020 for Hypokalemia [E87.6]  Patient presents with her daughter. She has missed several days of her peritoneal dialysis due to weakness and her current illness.   Reports several days of nausea, vomiting, diarrhea, weakness.   Patient was recent admitted for a right hip fracture requiring ORIF by Dr. Mack Guise. She is still getting physical therapy at home and is ambulating with a walker.   Objective:  Vital signs in last 24 hours:  Temp:  [97.9 F (36.6 C)] 97.9 F (36.6 C) (01/27 1211) Pulse Rate:  [79-81] 81 (01/27 1521) Resp:  [16-18] 18 (01/27 1521) BP: (114-116)/(72-77) 116/77 (01/27 1521) SpO2:  [100 %] 100 % (01/27 1521) Weight:  [47.6 kg] 47.6 kg (01/27 1212)  Weight change:  Filed Weights   05/29/20 1212  Weight: 47.6 kg    Intake/Output: No intake/output data recorded.   Intake/Output this shift:  Total I/O In: 500 [IV Piggyback:500] Out: -   Physical Exam: General: NAD,   Head: Normocephalic, atraumatic. Moist oral mucosal membranes  Eyes: Anicteric, PERRL  Neck: Supple, trachea midline  Lungs:  Clear to auscultation  Heart: Regular rate and rhythm  Abdomen:  Soft, nontender,   Extremities:  no peripheral edema.  Neurologic: Nonfocal, moving all four extremities  Skin: No lesions  Access: PD catheter    Basic Metabolic Panel: Recent Labs  Lab 05/29/20 1215  NA 125*  K 2.1*  CL 85*  CO2 20*  GLUCOSE 121*  BUN 45*  CREATININE 5.61*  CALCIUM 7.8*  MG 1.5*    Liver Function Tests: Recent Labs  Lab 05/29/20 1215  AST 19  ALT 16  ALKPHOS 122  BILITOT 0.6  PROT 5.2*  ALBUMIN 2.4*   No results for input(s): LIPASE, AMYLASE in the last 168 hours. No results for input(s): AMMONIA in the last 168 hours.  CBC: Recent Labs  Lab 05/29/20 1215  WBC 11.0*  NEUTROABS 8.2*  HGB 9.3*  HCT 26.4*  MCV 90.7  PLT 359    Cardiac  Enzymes: No results for input(s): CKTOTAL, CKMB, CKMBINDEX, TROPONINI in the last 168 hours.  BNP: Invalid input(s): POCBNP  CBG: No results for input(s): GLUCAP in the last 168 hours.  Microbiology: Results for orders placed or performed during the hospital encounter of 05/29/20  SARS Coronavirus 2 by RT PCR (hospital order, performed in [facility] hospital lab) Nasopharyngeal Nasopharyngeal Swab     Status: Abnormal   Collection Time: 05/29/20 12:15 PM   Specimen: Nasopharyngeal Swab  Result Value Ref Range Status   SARS Coronavirus 2 POSITIVE (A) NEGATIVE Final    Comment: RESULT CALLED TO, READ BACK BY AND VERIFIED WITH:  Gwynn Burly AT 7517 05/29/20 SDR (NOTE) SARS-CoV-2 target nucleic acids are DETECTED  SARS-CoV-2 RNA is generally detectable in upper respiratory specimens  during the acute phase of infection.  Positive results are indicative  of the presence of the identified virus, but do not rule out bacterial infection or co-infection with other pathogens not detected by the test.  Clinical correlation with patient history and  other diagnostic information is necessary to determine patient infection status.  The expected result is negative.  Fact Sheet for Patients:   StrictlyIdeas.no   Fact Sheet for Healthcare Providers:   BankingDealers.co.za    This test is not yet approved or cleared by the [place] FDA and  has been authorized for detection  and/or diagnosis of SARS-CoV-2 by FDA under an Emergency Use Authorization (EUA).  This EUA will remain in effect (meaning this test c an be used) for the duration of  the COVID-19 declaration under Section 564(b)(1) of the Act, 21 U.S.C. section 360-bbb-3(b)(1), unless the authorization is terminated or revoked sooner.  Performed at [facility], [address] [place], [place] [address]     Coagulation Studies: No results for input(s): LABPROT, INR in the  last 72 hours.  Urinalysis: No results for input(s): COLORURINE, LABSPEC, PHURINE, GLUCOSEU, HGBUR, BILIRUBINUR, KETONESUR, PROTEINUR, UROBILINOGEN, NITRITE, LEUKOCYTESUR in the last 72 hours.  Invalid input(s): APPERANCEUR    Imaging: DG Chest Portable 1 View  Result Date: 05/29/2020 CLINICAL DATA:  Weakness EXAM: PORTABLE CHEST 1 VIEW COMPARISON:  12/16/2019 and prior. FINDINGS: No pneumothorax or pleural effusion. Cardiomediastinal silhouette within normal limits. Hazy appearance of the peripheral lung fields. No acute osseous abnormality. IMPRESSION: Hazy appearance of the peripheral lung fields may be artifactual however cannot exclude atypical infection. Electronically Signed   By: Primitivo Gauze M.D.   On: 05/29/2020 16:41     Medications:   . magnesium sulfate bolus IVPB 2 g (05/29/20 1845)  . potassium chloride Stopped (05/29/20 1803)      Assessment/ Plan:  [name] is a 70 y.o. white female with end stage renal disease on peritoneal dialysis, atrial fibrillation, depression, GERD, who is admitted to [facility] on 05/29/2020 for Hypokalemia [E87.6]  [facility] peritoneal dialysis 51kg [name] 8 hours 4 exchanges 2033mL fills    1. End Stage Renal Disease: no acute indication for dialysis. Hold peritoneal dialysis for tonight.  - Start Normal saline with 45mEq of KCl at 77mL/hr  2. Hyponatremia: acute on chronic. Secondary to GI losses. Baseline 130-135  3. Hypokalemia: acute on chronic. Secondary to GI losses and inability to take losartan  4. Hypertension: patient with relative hypotension on presentation. Hold torsemide, losartan, carvedilol and amlodipine until tomorrow  5. Secondary Hyperparathyroidism: outpatient labs at goal. Hold calcium acetate and calcitriol   6. Anemia with chronic kidney disease: hemoglobin 9.3, normocytic.  - Gets EPO as outpatient.    LOS: 0 Jaydien Panepinto 1/27/20226:48 PM

## 2020-05-30 DIAGNOSIS — A0839 Other viral enteritis: Secondary | ICD-10-CM | POA: Diagnosis present

## 2020-05-30 DIAGNOSIS — Z79899 Other long term (current) drug therapy: Secondary | ICD-10-CM | POA: Diagnosis not present

## 2020-05-30 DIAGNOSIS — Z880 Allergy status to penicillin: Secondary | ICD-10-CM | POA: Diagnosis not present

## 2020-05-30 DIAGNOSIS — Z8719 Personal history of other diseases of the digestive system: Secondary | ICD-10-CM | POA: Diagnosis not present

## 2020-05-30 DIAGNOSIS — R531 Weakness: Secondary | ICD-10-CM | POA: Diagnosis not present

## 2020-05-30 DIAGNOSIS — D631 Anemia in chronic kidney disease: Secondary | ICD-10-CM | POA: Diagnosis not present

## 2020-05-30 DIAGNOSIS — E871 Hypo-osmolality and hyponatremia: Secondary | ICD-10-CM

## 2020-05-30 DIAGNOSIS — Z992 Dependence on renal dialysis: Secondary | ICD-10-CM

## 2020-05-30 DIAGNOSIS — I132 Hypertensive heart and chronic kidney disease with heart failure and with stage 5 chronic kidney disease, or end stage renal disease: Secondary | ICD-10-CM | POA: Diagnosis not present

## 2020-05-30 DIAGNOSIS — Z881 Allergy status to other antibiotic agents status: Secondary | ICD-10-CM | POA: Diagnosis not present

## 2020-05-30 DIAGNOSIS — G4700 Insomnia, unspecified: Secondary | ICD-10-CM | POA: Diagnosis present

## 2020-05-30 DIAGNOSIS — N2581 Secondary hyperparathyroidism of renal origin: Secondary | ICD-10-CM | POA: Diagnosis present

## 2020-05-30 DIAGNOSIS — I21A1 Myocardial infarction type 2: Secondary | ICD-10-CM | POA: Diagnosis not present

## 2020-05-30 DIAGNOSIS — K219 Gastro-esophageal reflux disease without esophagitis: Secondary | ICD-10-CM | POA: Diagnosis present

## 2020-05-30 DIAGNOSIS — E872 Acidosis: Secondary | ICD-10-CM | POA: Diagnosis present

## 2020-05-30 DIAGNOSIS — Z9181 History of falling: Secondary | ICD-10-CM | POA: Diagnosis not present

## 2020-05-30 DIAGNOSIS — E860 Dehydration: Secondary | ICD-10-CM | POA: Diagnosis present

## 2020-05-30 DIAGNOSIS — U071 COVID-19: Secondary | ICD-10-CM | POA: Diagnosis not present

## 2020-05-30 DIAGNOSIS — Z9049 Acquired absence of other specified parts of digestive tract: Secondary | ICD-10-CM | POA: Diagnosis not present

## 2020-05-30 DIAGNOSIS — A0472 Enterocolitis due to Clostridium difficile, not specified as recurrent: Secondary | ICD-10-CM | POA: Diagnosis present

## 2020-05-30 DIAGNOSIS — I5042 Chronic combined systolic (congestive) and diastolic (congestive) heart failure: Secondary | ICD-10-CM | POA: Diagnosis not present

## 2020-05-30 DIAGNOSIS — I120 Hypertensive chronic kidney disease with stage 5 chronic kidney disease or end stage renal disease: Secondary | ICD-10-CM | POA: Diagnosis present

## 2020-05-30 DIAGNOSIS — Z90710 Acquired absence of both cervix and uterus: Secondary | ICD-10-CM | POA: Diagnosis not present

## 2020-05-30 DIAGNOSIS — E43 Unspecified severe protein-calorie malnutrition: Secondary | ICD-10-CM | POA: Diagnosis not present

## 2020-05-30 DIAGNOSIS — E785 Hyperlipidemia, unspecified: Secondary | ICD-10-CM | POA: Diagnosis present

## 2020-05-30 DIAGNOSIS — Z8619 Personal history of other infectious and parasitic diseases: Secondary | ICD-10-CM | POA: Diagnosis not present

## 2020-05-30 DIAGNOSIS — I10 Essential (primary) hypertension: Secondary | ICD-10-CM | POA: Diagnosis not present

## 2020-05-30 DIAGNOSIS — E876 Hypokalemia: Secondary | ICD-10-CM | POA: Diagnosis not present

## 2020-05-30 DIAGNOSIS — Z882 Allergy status to sulfonamides status: Secondary | ICD-10-CM | POA: Diagnosis not present

## 2020-05-30 DIAGNOSIS — R5381 Other malaise: Secondary | ICD-10-CM | POA: Diagnosis not present

## 2020-05-30 DIAGNOSIS — N186 End stage renal disease: Secondary | ICD-10-CM

## 2020-05-30 DIAGNOSIS — F419 Anxiety disorder, unspecified: Secondary | ICD-10-CM | POA: Diagnosis present

## 2020-05-30 LAB — C DIFFICILE QUICK SCREEN W PCR REFLEX
C Diff antigen: POSITIVE — AB
C Diff toxin: NEGATIVE

## 2020-05-30 LAB — COMPREHENSIVE METABOLIC PANEL
ALT: 16 U/L (ref 0–44)
AST: 20 U/L (ref 15–41)
Albumin: 2.2 g/dL — ABNORMAL LOW (ref 3.5–5.0)
Alkaline Phosphatase: 121 U/L (ref 38–126)
Anion gap: 17 — ABNORMAL HIGH (ref 5–15)
BUN: 50 mg/dL — ABNORMAL HIGH (ref 8–23)
CO2: 19 mmol/L — ABNORMAL LOW (ref 22–32)
Calcium: 7.5 mg/dL — ABNORMAL LOW (ref 8.9–10.3)
Chloride: 90 mmol/L — ABNORMAL LOW (ref 98–111)
Creatinine, Ser: 5.78 mg/dL — ABNORMAL HIGH (ref 0.44–1.00)
GFR, Estimated: 7 mL/min — ABNORMAL LOW (ref >60)
Glucose, Bld: 105 mg/dL — ABNORMAL HIGH (ref 70–99)
Potassium: 3.4 mmol/L — ABNORMAL LOW (ref 3.5–5.1)
Sodium: 126 mmol/L — ABNORMAL LOW (ref 135–145)
Total Bilirubin: 0.4 mg/dL (ref 0.3–1.2)
Total Protein: 4.9 g/dL — ABNORMAL LOW (ref 6.5–8.1)

## 2020-05-30 LAB — CBC WITH DIFFERENTIAL/PLATELET
Abs Immature Granulocytes: 0.07 10*3/uL (ref 0.00–0.07)
Basophils Absolute: 0 10*3/uL (ref 0.0–0.1)
Basophils Relative: 1 %
Eosinophils Absolute: 0.2 10*3/uL (ref 0.0–0.5)
Eosinophils Relative: 2 %
HCT: 20.8 % — ABNORMAL LOW (ref 36.0–46.0)
Hemoglobin: 7.3 g/dL — ABNORMAL LOW (ref 12.0–15.0)
Immature Granulocytes: 1 %
Lymphocytes Relative: 17 %
Lymphs Abs: 1.5 10*3/uL (ref 0.7–4.0)
MCH: 31.7 pg (ref 26.0–34.0)
MCHC: 35.1 g/dL (ref 30.0–36.0)
MCV: 90.4 fL (ref 80.0–100.0)
Monocytes Absolute: 0.7 10*3/uL (ref 0.1–1.0)
Monocytes Relative: 8 %
Neutro Abs: 6.3 10*3/uL (ref 1.7–7.7)
Neutrophils Relative %: 71 %
Platelets: 298 10*3/uL (ref 150–400)
RBC: 2.3 MIL/uL — ABNORMAL LOW (ref 3.87–5.11)
RDW: 15.7 % — ABNORMAL HIGH (ref 11.5–15.5)
WBC: 8.8 10*3/uL (ref 4.0–10.5)
nRBC: 0 % (ref 0.0–0.2)

## 2020-05-30 LAB — IRON AND TIBC
Iron: 115 ug/dL (ref 28–170)
Saturation Ratios: 74 % — ABNORMAL HIGH (ref 10.4–31.8)
TIBC: 155 ug/dL — ABNORMAL LOW (ref 250–450)
UIBC: 40 ug/dL

## 2020-05-30 LAB — CLOSTRIDIUM DIFFICILE BY PCR, REFLEXED: Toxigenic C. Difficile by PCR: POSITIVE — AB

## 2020-05-30 LAB — FIBRIN DERIVATIVES D-DIMER (ARMC ONLY): Fibrin derivatives D-dimer (ARMC): 853.21 ng/mL (FEU) — ABNORMAL HIGH (ref 0.00–499.00)

## 2020-05-30 LAB — PREPARE RBC (CROSSMATCH)

## 2020-05-30 LAB — C-REACTIVE PROTEIN
CRP: 1.7 mg/dL — ABNORMAL HIGH (ref <1.0)
CRP: 1.2 mg/dL — ABNORMAL HIGH (ref <1.0)

## 2020-05-30 MED ORDER — VANCOMYCIN 50 MG/ML ORAL SOLUTION
125.0000 mg | Freq: Four times a day (QID) | ORAL | Status: DC
  Administered 2020-05-30 – 2020-05-31 (×3): 125 mg via ORAL
  Filled 2020-05-30 (×7): qty 2.5

## 2020-05-30 MED ORDER — DIPHENHYDRAMINE HCL 25 MG PO CAPS
25.0000 mg | ORAL_CAPSULE | Freq: Once | ORAL | Status: AC
  Administered 2020-05-30: 25 mg via ORAL
  Filled 2020-05-30: qty 1

## 2020-05-30 MED ORDER — POTASSIUM CHLORIDE 20 MEQ PO PACK
40.0000 meq | PACK | Freq: Once | ORAL | Status: AC
  Administered 2020-05-30: 40 meq via ORAL
  Filled 2020-05-30: qty 2

## 2020-05-30 MED ORDER — GENTAMICIN SULFATE 0.1 % EX CREA
1.0000 "application " | TOPICAL_CREAM | Freq: Every day | CUTANEOUS | Status: DC
  Administered 2020-05-30 – 2020-05-31 (×2): 1 via TOPICAL
  Filled 2020-05-30: qty 15

## 2020-05-30 MED ORDER — SODIUM BICARBONATE 650 MG PO TABS
1300.0000 mg | ORAL_TABLET | Freq: Two times a day (BID) | ORAL | Status: DC
  Administered 2020-05-30 – 2020-05-31 (×3): 1300 mg via ORAL
  Filled 2020-05-30 (×4): qty 2

## 2020-05-30 MED ORDER — SODIUM CHLORIDE 1 G PO TABS
1.0000 g | ORAL_TABLET | Freq: Every day | ORAL | Status: DC
  Administered 2020-05-30 – 2020-05-31 (×2): 1 g via ORAL
  Filled 2020-05-30 (×2): qty 1

## 2020-05-30 MED ORDER — SODIUM CHLORIDE 0.9 % IV BOLUS
250.0000 mL | Freq: Once | INTRAVENOUS | Status: AC
  Administered 2020-05-30: 250 mL via INTRAVENOUS

## 2020-05-30 MED ORDER — SODIUM CHLORIDE 0.9% IV SOLUTION: Freq: Once | INTRAVENOUS | Status: DC

## 2020-05-30 MED ORDER — CHLORHEXIDINE GLUCONATE CLOTH 2 % EX PADS
6.0000 | MEDICATED_PAD | Freq: Every day | CUTANEOUS | Status: DC
  Administered 2020-05-30 – 2020-05-31 (×2): 6 via TOPICAL

## 2020-05-30 MED ORDER — DELFLEX-LC/1.5% DEXTROSE 344 MOSM/L IP SOLN
INTRAPERITONEAL | Status: DC
  Filled 2020-05-30: qty 3000

## 2020-05-30 MED ORDER — LOPERAMIDE HCL 2 MG PO CAPS
2.0000 mg | ORAL_CAPSULE | ORAL | Status: DC | PRN
  Administered 2020-05-30: 2 mg via ORAL
  Filled 2020-05-30: qty 1

## 2020-05-30 MED ORDER — ACETAMINOPHEN 325 MG PO TABS
650.0000 mg | ORAL_TABLET | Freq: Once | ORAL | Status: DC
  Filled 2020-05-30: qty 2

## 2020-05-30 NOTE — Progress Notes (Signed)
[facility]  ROUNDING NOTE   Subjective:   Patient had peritoneal dialysis held last night. She got IV fluids overnight. She states she is feeling much better.   PRBC transfusion yesterday.   Objective:  Vital signs in last 24 hours:  Temp:  [97.7 F (36.5 C)-98.4 F (36.9 C)] 97.9 F (36.6 C) (01/28 1056) Pulse Rate:  [68-92] 92 (01/28 1111) Resp:  [14-19] 15 (01/28 1111) BP: (109-157)/(65-77) 111/65 (01/28 1111) SpO2:  [100 %] 100 % (01/28 1111) Weight:  [47.4 kg] 47.4 kg (01/28 0500)  Weight change:  Filed Weights   05/29/20 1212 05/29/20 2007 05/30/20 0500  Weight: 47.6 kg 47.4 kg 47.4 kg    Intake/Output: I/O last 3 completed shifts: In: 500 [IV Piggyback:500] Out: -    Intake/Output this shift:  Total I/O In: 350 [Blood:350] Out: 0   Physical Exam: General: NAD, laying in bed  Head: Normocephalic, atraumatic. Moist oral mucosal membranes  Eyes: Anicteric, PERRL  Neck: Supple, trachea midline  Lungs:  Clear to auscultation  Heart: Regular rate and rhythm  Abdomen:  Soft, nontender,   Extremities:  no peripheral edema.  Neurologic: Nonfocal, moving all four extremities  Skin: No lesions  Access: PD catheter    Basic Metabolic Panel: Recent Labs  Lab 05/29/20 1215 05/29/20 2043 05/30/20 0256  NA 125* 125* 126*  K 2.1* 2.9* 3.4*  CL 85* 86* 90*  CO2 20* 18* 19*  GLUCOSE 121* 98 105*  BUN 45* 46* 50*  CREATININE 5.61* 5.80* 5.78*  CALCIUM 7.8* 7.8* 7.5*  MG 1.5*  --   --     Liver Function Tests: Recent Labs  Lab 05/29/20 1215 05/30/20 0256  AST 19 20  ALT 16 16  ALKPHOS 122 121  BILITOT 0.6 0.4  PROT 5.2* 4.9*  ALBUMIN 2.4* 2.2*   No results for input(s): LIPASE, AMYLASE in the last 168 hours. No results for input(s): AMMONIA in the last 168 hours.  CBC: Recent Labs  Lab 05/29/20 1215 05/30/20 0205  WBC 11.0* 8.8  NEUTROABS 8.2* 6.3  HGB 9.3* 7.3*  HCT 26.4* 20.8*  MCV 90.7 90.4  PLT 359 298    Cardiac  Enzymes: No results for input(s): CKTOTAL, CKMB, CKMBINDEX, TROPONINI in the last 168 hours.  BNP: Invalid input(s): POCBNP  CBG: No results for input(s): GLUCAP in the last 168 hours.  Microbiology: Results for orders placed or performed during the hospital encounter of 05/29/20  SARS Coronavirus 2 by RT PCR (hospital order, performed in [facility] hospital lab) Nasopharyngeal Nasopharyngeal Swab     Status: Abnormal   Collection Time: 05/29/20 12:15 PM   Specimen: Nasopharyngeal Swab  Result Value Ref Range Status   SARS Coronavirus 2 POSITIVE (A) NEGATIVE Final    Comment: RESULT CALLED TO, READ BACK BY AND VERIFIED WITH:  Gwynn Burly AT 3235 05/29/20 SDR (NOTE) SARS-CoV-2 target nucleic acids are DETECTED  SARS-CoV-2 RNA is generally detectable in upper respiratory specimens  during the acute phase of infection.  Positive results are indicative  of the presence of the identified virus, but do not rule out bacterial infection or co-infection with other pathogens not detected by the test.  Clinical correlation with patient history and  other diagnostic information is necessary to determine patient infection status.  The expected result is negative.  Fact Sheet for Patients:   StrictlyIdeas.no   Fact Sheet for Healthcare Providers:   BankingDealers.co.za    This test is not yet approved or cleared by  the [employer] and  has been authorized for detection and/or diagnosis of SARS-CoV-2 by FDA under an Emergency Use Authorization (EUA).  This EUA will remain in effect (meaning this test c an be used) for the duration of  the COVID-19 declaration under Section 564(b)(1) of the Act, 21 U.S.C. section 360-bbb-3(b)(1), unless the authorization is terminated or revoked sooner.  Performed at [facility], [address] [place], [place] [address]   Culture, blood (Routine X 2) w Reflex to ID Panel     Status: None  (Preliminary result)   Collection Time: 05/29/20  8:30 PM   Specimen: BLOOD  Result Value Ref Range Status   Specimen Description BLOOD RIGHT ANTECUBITAL  Final   Special Requests   Final    BOTTLES DRAWN AEROBIC AND ANAEROBIC Blood Culture adequate volume   Culture   Final    NO GROWTH < 12 HOURS Performed at [facility], [address], [place], [place] [address]    Report Status PENDING  Incomplete  Culture, blood (Routine X 2) w Reflex to ID Panel     Status: None (Preliminary result)   Collection Time: 05/29/20  8:43 PM   Specimen: BLOOD  Result Value Ref Range Status   Specimen Description BLOOD BLOOD RIGHT HAND  Final   Special Requests   Final    BOTTLES DRAWN AEROBIC AND ANAEROBIC Blood Culture adequate volume   Culture   Final    NO GROWTH < 12 HOURS Performed at [facility], [address] [place], [place] [address]    Report Status PENDING  Incomplete  C Difficile Quick Screen w PCR reflex     Status: Abnormal   Collection Time: 05/30/20  9:17 AM   Specimen: STOOL  Result Value Ref Range Status   C Diff antigen POSITIVE (A) NEGATIVE Final   C Diff toxin NEGATIVE NEGATIVE Final   C Diff interpretation Results are indeterminate. See PCR results.  Final    Comment: Performed at [facility], [address] [place], [place] [address]  C. Diff by PCR, Reflexed     Status: Abnormal   Collection Time: 05/30/20  9:17 AM  Result Value Ref Range Status   Toxigenic C. Difficile by PCR POSITIVE (A) NEGATIVE Final    Comment: Positive for toxigenic C. difficile with little to no toxin production. Only treat if clinical presentation suggests symptomatic illness. Performed at [facility], [address] [place],  [address]     Coagulation Studies: No results for input(s): LABPROT, INR in the last 72 hours.  Urinalysis: No results for input(s): COLORURINE, LABSPEC, PHURINE, GLUCOSEU, HGBUR, BILIRUBINUR, KETONESUR,  PROTEINUR, UROBILINOGEN, NITRITE, LEUKOCYTESUR in the last 72 hours.  Invalid input(s): APPERANCEUR    Imaging: DG Chest Portable 1 View  Result Date: 05/29/2020 CLINICAL DATA:  Weakness EXAM: PORTABLE CHEST 1 VIEW COMPARISON:  12/16/2019 and prior. FINDINGS: No pneumothorax or pleural effusion. Cardiomediastinal silhouette within normal limits. Hazy appearance of the peripheral lung fields. No acute osseous abnormality. IMPRESSION: Hazy appearance of the peripheral lung fields may be artifactual however cannot exclude atypical infection. Electronically Signed   By: Primitivo Gauze M.D.   On: 05/29/2020 16:41     Medications:    . sodium chloride   Intravenous Once  . acetaminophen  650 mg Oral Once  . vitamin C  500 mg Oral Daily  . buPROPion  75 mg Oral BID  . calcium acetate  667 mg Oral TID WC  .  carvedilol  25 mg Oral BID WC  . Chlorhexidine Gluconate Cloth  6 each Topical Daily  . heparin  5,000 Units Subcutaneous Q8H  . losartan  100 mg Oral QHS  . pantoprazole  40 mg Oral Daily  . sodium bicarbonate  1,300 mg Oral BID  . sodium chloride  1 g Oral Daily  . zinc sulfate  220 mg Oral Daily     Assessment/ Plan:  Ms. TAEGAN HAIDER is a 70 y.o. white female with end stage renal disease on peritoneal dialysis, atrial fibrillation, depression, GERD, who is admitted to [facility] on 05/29/2020 for Hypokalemia [E87.6] Nausea vomiting and diarrhea [R11.2, R19.7] COVID-19 [U07.1]  [facility] peritoneal dialysis 51kg [name] 8 hours 4 exchanges 2049mL fills    1. End Stage Renal Disease:  Dialysis for tonight. Orders prepared. 1.5% dextrose.   2. Hyponatremia: acute on chronic. Secondary to GI losses. Baseline 130-135 - started on salt tabs  3. Hypokalemia: acute on chronic. Secondary to GI losses  - home potassium chloride and losartan restarted  4. Hypertension: patient with relative hypotension on presentation. Home regimen of torsemide, losartan, carvedilol and  amlodipine   Restarted losartan and carvedilol.  5. Secondary Hyperparathyroidism: outpatient labs at goal. Hold calcium acetate and calcitriol   6. Anemia with chronic kidney disease: hemoglobin 7.3, normocytic.  - Gets EPO as outpatient.    LOS: 0 Kumiko Fishman 1/28/20221:49 PM

## 2020-05-30 NOTE — Progress Notes (Signed)
Peritoneal dialysis patient known at [facility], found to be COVID +. Due to home treatments, there will not be any chance to due to COVID, unless patient is leaving to rehab. Please contact me with any dialysis placement concerns.  Elvera Bicker Dialysis Coordinator [phone]

## 2020-05-30 NOTE — Progress Notes (Addendum)
PROGRESS NOTE    Amy [name]  ZOX:[id number] DOB: April 01, 1951 DOA: 05/29/2020 PCP: [name], MD   Chief complaint.  Nausea vomiting diarrhea Brief Narrative:  Amy [name] is a 70 y.o. female with medical history significant for ESRD on PD and previously on HD, hypertension, depression/anxiety, history of right hip fracture secondary to mechanical fall in December 2021, presented to the ED for chief concern of N/V/D for 1 week.    Assessment & Plan:   Principal Problem:   Hypokalemia Active Problems:   Anxiety   Essential hypertension   Benign essential HTN   HLD (hyperlipidemia)   ESRD on dialysis ([place])   Gastroenteritis due to COVID-19 virus  #1.  Hyponatremia secondary to dehydration. Hypokalemia secondary to GI loss. Metabolic acidosis. Gastroenteritis secondary to COVID-19. COVID-19 infection, Patient does not have respiratory symptoms. Patient had been fluids overnight.  Currently volume status is better.  Still has significant hyponatremia, hypokalemia and metabolic acidosis. We will give salt tablets 1 g daily.  Oral sodium bicarbonate.  Supplement potassium. I will keep patient overnight to ensure stability.  She will be discharged home tomorrow.  2.  End-stage renal disease on peritoneal dialysis. Followed by nephrology  3.  Essential hypertension. Continue home medicines.  4.  Anemia of chronic kidney disease. Hemoglobin today 7.3, no evidence of bleeding.  Recheck CBC tomorrow, transfuse as needed. Check iron B12 level.  5. C diff colitis. Stool study came back with positive c diff PCR, but negative toxin. Due to symptoms of n/v and diarrhea, opt to treat.   DVT prophylaxis: Heparin Code Status: Full Family Communication: None Disposition Plan:  .   Status is: Observation  The patient remains OBS appropriate and will d/c before 2 midnights.  Dispo: The patient is from: Home              Anticipated d/c is to: Home               Anticipated d/c date is: 1 day              Patient currently is not medically stable to d/c.   Difficult to place patient No        I/O last 3 completed shifts: In: 500 [IV Piggyback:500] Out: -  Total I/O In: 350 [Blood:350] Out: 0      Consultants:   Nephrology  Procedures: None  Antimicrobials: None  Subjective: Patient feels better today, she no longer has any nausea vomiting.  Diarrhea has since resolved. She does not have any short of breath or cough.  Objective: Vitals:   05/30/20 0500 05/30/20 0849 05/30/20 1056 05/30/20 1111  BP:  (!) 157/75 109/75 111/65  Pulse:  70 92 92  Resp:  19 14 15   Temp:  97.7 F (36.5 C) 97.9 F (36.6 C)   TempSrc:   Oral   SpO2:  100% 100% 100%  Weight: 47.4 kg     Height:        Intake/Output Summary (Last 24 hours) at 05/30/2020 1305 Last data filed at 05/30/2020 1059 Gross per 24 hour  Intake 850 ml  Output 0 ml  Net 850 ml   Filed Weights   05/29/20 1212 05/29/20 2007 05/30/20 0500  Weight: 47.6 kg 47.4 kg 47.4 kg    Examination:  General exam: Appears calm and comfortable  Respiratory system: Clear to auscultation. Respiratory effort normal. Cardiovascular system: S1 & S2 heard, RRR. No JVD, murmurs, rubs, gallops or clicks. No  pedal edema. Gastrointestinal system: Abdomen is nondistended, soft and nontender. No organomegaly or masses felt. Normal bowel sounds heard. Central nervous system: Alert and oriented. No focal neurological deficits. Extremities: Symmetric 5 x 5 power. Skin: No rashes, lesions or ulcers Psychiatry:  Mood & affect appropriate.     Data Reviewed: I have personally reviewed following labs and imaging studies  CBC: Recent Labs  Lab 05/29/20 1215 05/30/20 0205  WBC 11.0* 8.8  NEUTROABS 8.2* 6.3  HGB 9.3* 7.3*  HCT 26.4* 20.8*  MCV 90.7 90.4  PLT 359 629   Basic Metabolic Panel: Recent Labs  Lab 05/29/20 1215 05/29/20 2043 05/30/20 0256  NA 125* 125* 126*  K 2.1* 2.9*  3.4*  CL 85* 86* 90*  CO2 20* 18* 19*  GLUCOSE 121* 98 105*  BUN 45* 46* 50*  CREATININE 5.61* 5.80* 5.78*  CALCIUM 7.8* 7.8* 7.5*  MG 1.5*  --   --    GFR: Estimated Creatinine Clearance: 6.7 mL/min (A) (by C-G formula based on SCr of 5.78 mg/dL (H)). Liver Function Tests: Recent Labs  Lab 05/29/20 1215 05/30/20 0256  AST 19 20  ALT 16 16  ALKPHOS 122 121  BILITOT 0.6 0.4  PROT 5.2* 4.9*  ALBUMIN 2.4* 2.2*   No results for input(s): LIPASE, AMYLASE in the last 168 hours. No results for input(s): AMMONIA in the last 168 hours. Coagulation Profile: No results for input(s): INR, PROTIME in the last 168 hours. Cardiac Enzymes: No results for input(s): CKTOTAL, CKMB, CKMBINDEX, TROPONINI in the last 168 hours. BNP (last 3 results) No results for input(s): PROBNP in the last 8760 hours. HbA1C: No results for input(s): HGBA1C in the last 72 hours. CBG: No results for input(s): GLUCAP in the last 168 hours. Lipid Profile: No results for input(s): CHOL, HDL, LDLCALC, TRIG, CHOLHDL, LDLDIRECT in the last 72 hours. Thyroid Function Tests: No results for input(s): TSH, T4TOTAL, FREET4, T3FREE, THYROIDAB in the last 72 hours. Anemia Panel: Recent Labs    05/29/20 1834  FERRITIN 934*   Sepsis Labs: Recent Labs  Lab 05/29/20 1215  LATICACIDVEN 1.5    Recent Results (from the past 240 hour(s))  SARS Coronavirus 2 by RT PCR (hospital order, performed in [facility] hospital lab) Nasopharyngeal Nasopharyngeal Swab     Status: Abnormal   Collection Time: 05/29/20 12:15 PM   Specimen: Nasopharyngeal Swab  Result Value Ref Range Status   SARS Coronavirus 2 POSITIVE (A) NEGATIVE Final    Comment: RESULT CALLED TO, READ BACK BY AND VERIFIED WITH:  Gwynn Burly AT 4765 05/29/20 SDR (NOTE) SARS-CoV-2 target nucleic acids are DETECTED  SARS-CoV-2 RNA is generally detectable in upper respiratory specimens  during the acute phase of infection.  Positive results are indicative  of  the presence of the identified virus, but do not rule out bacterial infection or co-infection with other pathogens not detected by the test.  Clinical correlation with patient history and  other diagnostic information is necessary to determine patient infection status.  The expected result is negative.  Fact Sheet for Patients:   StrictlyIdeas.no   Fact Sheet for Healthcare Providers:   BankingDealers.co.za    This test is not yet approved or cleared by the [place] FDA and  has been authorized for detection and/or diagnosis of SARS-CoV-2 by FDA under an Emergency Use Authorization (EUA).  This EUA will remain in effect (meaning this test c an be used) for the duration of  the COVID-19 declaration under Section 564(b)(1) of  the Act, 21 U.S.C. section 360-bbb-3(b)(1), unless the authorization is terminated or revoked sooner.  Performed at [facility], [address] [place], [place] [address]   Culture, blood (Routine X 2) w Reflex to ID Panel     Status: None (Preliminary result)   Collection Time: 05/29/20  8:30 PM   Specimen: BLOOD  Result Value Ref Range Status   Specimen Description BLOOD RIGHT ANTECUBITAL  Final   Special Requests   Final    BOTTLES DRAWN AEROBIC AND ANAEROBIC Blood Culture adequate volume   Culture   Final    NO GROWTH < 12 HOURS Performed at [facility], [address], [place], [place] [address]    Report Status PENDING  Incomplete  Culture, blood (Routine X 2) w Reflex to ID Panel     Status: None (Preliminary result)   Collection Time: 05/29/20  8:43 PM   Specimen: BLOOD  Result Value Ref Range Status   Specimen Description BLOOD BLOOD RIGHT HAND  Final   Special Requests   Final    BOTTLES DRAWN AEROBIC AND ANAEROBIC Blood Culture adequate volume   Culture   Final    NO GROWTH < 12 HOURS Performed at [facility], [address] [place], [place] [address]     Report Status PENDING  Incomplete  C Difficile Quick Screen w PCR reflex     Status: Abnormal   Collection Time: 05/30/20  9:17 AM   Specimen: STOOL  Result Value Ref Range Status   C Diff antigen POSITIVE (A) NEGATIVE Final   C Diff toxin NEGATIVE NEGATIVE Final   C Diff interpretation Results are indeterminate. See PCR results.  Final    Comment: Performed at [facility], [address] [place], [place] [address]         Radiology Studies: DG Chest Portable 1 View  Result Date: 05/29/2020 CLINICAL DATA:  Weakness EXAM: PORTABLE CHEST 1 VIEW COMPARISON:  12/16/2019 and prior. FINDINGS: No pneumothorax or pleural effusion. Cardiomediastinal silhouette within normal limits. Hazy appearance of the peripheral lung fields. No acute osseous abnormality. IMPRESSION: Hazy appearance of the peripheral lung fields may be artifactual however cannot exclude atypical infection. Electronically Signed   By: Primitivo Gauze M.D.   On: 05/29/2020 16:41        Scheduled Meds: . sodium chloride   Intravenous Once  . acetaminophen  650 mg Oral Once  . vitamin C  500 mg Oral Daily  . buPROPion  75 mg Oral BID  . calcium acetate  667 mg Oral TID WC  . carvedilol  25 mg Oral BID WC  . Chlorhexidine Gluconate Cloth  6 each Topical Daily  . heparin  5,000 Units Subcutaneous Q8H  . losartan  100 mg Oral QHS  . pantoprazole  40 mg Oral Daily  . sodium bicarbonate  1,300 mg Oral BID  . sodium chloride  1 g Oral Daily  . zinc sulfate  220 mg Oral Daily   Continuous Infusions:   LOS: 0 days    Time spent: 28 minutes    Sharen Hones, MD [facility]   To contact the attending provider between 7A-7P or the covering provider during after hours 7P-7A, please log into the web site [email or web address] and access using universal [place] password for that web site. If you do not have the password, please call the hospital operator.  05/30/2020, 1:05 PM

## 2020-05-30 NOTE — Progress Notes (Signed)
Patient had one prolonged episode of loose, watery stool this morning. Specimen collected and sent to lab. Resulted back positive for Cdiff. Patient was palced on Airborne and Enteric Precautions this morning prior to obtaining specimen. Patient's one unit of blood hung without complications. Patient currently resting in bed comfortably. Will continue to monitor frequently.

## 2020-05-31 DIAGNOSIS — A0472 Enterocolitis due to Clostridium difficile, not specified as recurrent: Secondary | ICD-10-CM

## 2020-05-31 DIAGNOSIS — U071 COVID-19: Secondary | ICD-10-CM

## 2020-05-31 DIAGNOSIS — N186 End stage renal disease: Secondary | ICD-10-CM | POA: Diagnosis not present

## 2020-05-31 DIAGNOSIS — A0839 Other viral enteritis: Secondary | ICD-10-CM

## 2020-05-31 DIAGNOSIS — E876 Hypokalemia: Secondary | ICD-10-CM | POA: Diagnosis not present

## 2020-05-31 DIAGNOSIS — Z992 Dependence on renal dialysis: Secondary | ICD-10-CM | POA: Diagnosis not present

## 2020-05-31 LAB — CBC WITH DIFFERENTIAL/PLATELET
Abs Immature Granulocytes: 0.08 10*3/uL — ABNORMAL HIGH (ref 0.00–0.07)
Basophils Absolute: 0.1 10*3/uL (ref 0.0–0.1)
Basophils Relative: 1 %
Eosinophils Absolute: 0.3 10*3/uL (ref 0.0–0.5)
Eosinophils Relative: 3 %
HCT: 28.4 % — ABNORMAL LOW (ref 36.0–46.0)
Hemoglobin: 10.3 g/dL — ABNORMAL LOW (ref 12.0–15.0)
Immature Granulocytes: 1 %
Lymphocytes Relative: 18 %
Lymphs Abs: 1.7 10*3/uL (ref 0.7–4.0)
MCH: 31.4 pg (ref 26.0–34.0)
MCHC: 36.3 g/dL — ABNORMAL HIGH (ref 30.0–36.0)
MCV: 86.6 fL (ref 80.0–100.0)
Monocytes Absolute: 1.1 10*3/uL — ABNORMAL HIGH (ref 0.1–1.0)
Monocytes Relative: 11 %
Neutro Abs: 6.3 10*3/uL (ref 1.7–7.7)
Neutrophils Relative %: 66 %
Platelets: 304 10*3/uL (ref 150–400)
RBC: 3.28 MIL/uL — ABNORMAL LOW (ref 3.87–5.11)
RDW: 16.3 % — ABNORMAL HIGH (ref 11.5–15.5)
WBC: 9.5 10*3/uL (ref 4.0–10.5)
nRBC: 0 % (ref 0.0–0.2)

## 2020-05-31 LAB — BPAM RBC
Blood Product Expiration Date: 202202282359
ISSUE DATE / TIME: 202201281048
Unit Type and Rh: 5100

## 2020-05-31 LAB — TYPE AND SCREEN
ABO/RH(D): O POS
Antibody Screen: NEGATIVE
Unit division: 0

## 2020-05-31 LAB — COMPREHENSIVE METABOLIC PANEL
ALT: 14 U/L (ref 0–44)
AST: 22 U/L (ref 15–41)
Albumin: 2.3 g/dL — ABNORMAL LOW (ref 3.5–5.0)
Alkaline Phosphatase: 108 U/L (ref 38–126)
Anion gap: 14 (ref 5–15)
BUN: 43 mg/dL — ABNORMAL HIGH (ref 8–23)
CO2: 21 mmol/L — ABNORMAL LOW (ref 22–32)
Calcium: 8 mg/dL — ABNORMAL LOW (ref 8.9–10.3)
Chloride: 94 mmol/L — ABNORMAL LOW (ref 98–111)
Creatinine, Ser: 5.06 mg/dL — ABNORMAL HIGH (ref 0.44–1.00)
GFR, Estimated: 9 mL/min — ABNORMAL LOW (ref >60)
Glucose, Bld: 124 mg/dL — ABNORMAL HIGH (ref 70–99)
Potassium: 2.9 mmol/L — ABNORMAL LOW (ref 3.5–5.1)
Sodium: 129 mmol/L — ABNORMAL LOW (ref 135–145)
Total Bilirubin: 0.3 mg/dL (ref 0.3–1.2)
Total Protein: 4.7 g/dL — ABNORMAL LOW (ref 6.5–8.1)

## 2020-05-31 LAB — C-REACTIVE PROTEIN: CRP: 1 mg/dL — ABNORMAL HIGH (ref <1.0)

## 2020-05-31 LAB — FIBRIN DERIVATIVES D-DIMER (ARMC ONLY): Fibrin derivatives D-dimer (ARMC): 968.35 ng/mL (FEU) — ABNORMAL HIGH (ref 0.00–499.00)

## 2020-05-31 LAB — VITAMIN B12: Vitamin B-12: 1073 pg/mL — ABNORMAL HIGH (ref 180–914)

## 2020-05-31 MED ORDER — POTASSIUM CHLORIDE 20 MEQ PO PACK
40.0000 meq | PACK | ORAL | Status: DC
  Filled 2020-05-31 (×2): qty 2

## 2020-05-31 MED ORDER — ASCORBIC ACID 500 MG PO TABS: 500.0000 mg | ORAL_TABLET | Freq: Every day | ORAL | 0 refills | Status: AC

## 2020-05-31 MED ORDER — POTASSIUM CHLORIDE 20 MEQ PO PACK
40.0000 meq | PACK | ORAL | Status: AC
Start: 2020-05-31 — End: 2020-05-31
  Administered 2020-05-31 (×2): 40 meq via ORAL
  Filled 2020-05-31: qty 2

## 2020-05-31 MED ORDER — SODIUM CHLORIDE 0.9 % IV SOLN: INTRAVENOUS | Status: DC | PRN

## 2020-05-31 MED ORDER — POTASSIUM CHLORIDE 20 MEQ PO PACK: 40.0000 meq | PACK | Freq: Four times a day (QID) | ORAL | Status: DC

## 2020-05-31 MED ORDER — ZINC SULFATE 220 (50 ZN) MG PO CAPS: 220.0000 mg | ORAL_CAPSULE | Freq: Every day | ORAL | 0 refills | Status: AC

## 2020-05-31 MED ORDER — VANCOMYCIN HCL 125 MG PO CAPS
125.0000 mg | ORAL_CAPSULE | Freq: Four times a day (QID) | ORAL | 0 refills | Status: AC
Start: 2020-05-31 — End: 2020-06-09

## 2020-05-31 MED ORDER — SODIUM BICARBONATE 650 MG PO TABS: 650.0000 mg | ORAL_TABLET | Freq: Two times a day (BID) | ORAL | 0 refills | Status: DC

## 2020-05-31 MED ORDER — POTASSIUM CHLORIDE 10 MEQ/100ML IV SOLN
10.0000 meq | INTRAVENOUS | Status: DC
  Filled 2020-05-31 (×2): qty 100

## 2020-05-31 NOTE — Discharge Instructions (Signed)

## 2020-05-31 NOTE — Discharge Summary (Signed)
Physician Discharge Summary  Patient ID: Amy [name] MRN: [id number] DOB/AGE: 1950-12-04 70 y.o.  Admit date: 05/29/2020 Discharge date: 05/31/2020  Admission Diagnoses:  Discharge Diagnoses:  Principal Problem:   Hypokalemia Active Problems:   Anxiety   Essential hypertension   Benign essential HTN   HLD (hyperlipidemia)   ESRD on dialysis ([place])   Gastroenteritis due to COVID-19 virus   Discharged Condition: good  Hospital Course:  Amy [name] a 70 y.o.femalewith medical history significant forESRD on PD and previously on HD, hypertension, depression/anxiety,history of right hip fracture secondary to mechanical fall in December 2021,presented to the ED for chief concern of N/V/D for 1 week.  #1.  Hyponatremia secondary to dehydration. Hypokalemia secondary to GI loss. Metabolic acidosis. Gastroenteritis secondary to COVID-19. COVID-19 infection, Patient does not have respiratory symptoms. Patient had been fluids overnight.  Currently volume status is better.  Still has significant hyponatremia, hypokalemia and metabolic acidosis. She is given salt tablets 1 g daily.  Oral sodium bicarbonate.   She received 80 mEq oral potassium and 10 mg IV potassium yesterday, potassium increased from 2.8 to 2.9 today.  She will be receiving additional 80 mEq of oral potassium and 20 mEq of IV potassium today.  She will follow up with nephrology as outpatient.  2.  End-stage renal disease on peritoneal dialysis. Followed by nephrology  3.  Essential hypertension. Continue home medicines.  4.  Anemia of chronic kidney disease. Hemoglobin  7.3, no evidence of bleeding.    She received 1 unit PRBC yesterday, today's hemoglobin 10.3.  She has normal iron B12 level.  5. C diff colitis. Stool study came back with positive c diff PCR, but negative toxin. Due to symptoms of n/v and diarrhea, opt to treat. Diarrhea has resolved since arriving the hospital.    Consults:  nephrology  Significant Diagnostic Studies:  PORTABLE CHEST 1 VIEW  COMPARISON:  12/16/2019 and prior.  FINDINGS: No pneumothorax or pleural effusion. Cardiomediastinal silhouette within normal limits. Hazy appearance of the peripheral lung fields. No acute osseous abnormality.  IMPRESSION: Hazy appearance of the peripheral lung fields may be artifactual however cannot exclude atypical infection.   Electronically Signed   By: Primitivo Gauze M.D.   On: 05/29/2020 16:41   Treatments: Symptomatic treatment, oral vancomycin  Discharge Exam: Blood pressure (!) 145/73, pulse 83, temperature 98.7 F (37.1 C), temperature source Oral, resp. rate 16, height 5\' 4"  (1.626 m), weight 50.8 kg, SpO2 97 %. General appearance: alert and cooperative Resp: clear to auscultation bilaterally Cardio: regular rate and rhythm, S1, S2 normal, no murmur, click, rub or gallop GI: soft, non-tender; bowel sounds normal; no masses,  no organomegaly Extremities: extremities normal, atraumatic, no cyanosis or edema  Disposition: Discharge disposition: 01-Home or Self Care       Discharge Instructions    Diet - low sodium heart healthy   Complete by: As directed    Discharge wound care:   Complete by: As directed    Keep clean   Increase activity slowly   Complete by: As directed      Allergies as of 05/31/2020      Reactions   Cephalosporins Hives, Swelling   Clarithromycin Other (See Comments)   Unknown reaction   Lunesta  [eszopiclone]    Heart racing   Penicillin V    Has patient had a PCN reaction causing immediate rash, facial/tongue/throat swelling, SOB or lightheadedness with hypotension: Yes Has patient had a PCN reaction causing severe rash involving mucus  membranes or skin necrosis: No Has patient had a PCN reaction that required hospitalization No Has patient had a PCN reaction occurring within the last 10 years: No If all of the above answers are "NO", then may  proceed with Cephalosporin use.   Sulfa Antibiotics    Unknown reaction      Medication List    TAKE these medications   ascorbic acid 500 MG tablet Commonly known as: VITAMIN C Take 1 tablet (500 mg total) by mouth daily for 14 days.   buPROPion 75 MG tablet Commonly known as: WELLBUTRIN TAKE 1 TABLET(75 MG) BY MOUTH TWICE DAILY What changed: See the new instructions.   butalbital-acetaminophen-caffeine 50-325-40 MG tablet Commonly known as: FIORICET TAKE 1 TO 2 TABLETS BY MOUTH EVERY 4 HOURS AS NEEDED FOR HEADACHE What changed: See the new instructions.   calcium acetate 667 MG capsule Commonly known as: PHOSLO Take 667 mg by mouth 3 (three) times daily with meals.   carvedilol 25 MG tablet Commonly known as: COREG Take 1 tablet (25 mg total) by mouth 2 (two) times daily with a meal.   cloNIDine 0.2 MG tablet Commonly known as: CATAPRES Take 1 tablet (0.2 mg total) by mouth 2 (two) times daily.   esomeprazole 20 MG capsule Commonly known as: NEXIUM Take 20 mg by mouth daily.   hydrALAZINE 50 MG tablet Commonly known as: APRESOLINE Take 1 tablet (50 mg total) by mouth 3 (three) times daily as needed. For SBP > 160 mm hg   LORazepam 1 MG tablet Commonly known as: ATIVAN TAKE 1 TABLET(1 MG) BY MOUTH TWICE DAILY AS NEEDED What changed: See the new instructions.   losartan 100 MG tablet Commonly known as: COZAAR Take 100 mg by mouth at bedtime.   sennosides-docusate sodium 8.6-50 MG tablet Commonly known as: SENOKOT-S Take 2 tablets by mouth 2 (two) times daily as needed for constipation.   sodium bicarbonate 650 MG tablet Take 1 tablet (650 mg total) by mouth 2 (two) times daily.   valACYclovir 1000 MG tablet Commonly known as: VALTREX Take 1,000 mg by mouth 2 (two) times daily as needed (cold sores).   vancomycin 125 MG capsule Commonly known as: VANCOCIN Take 1 capsule (125 mg total) by mouth 4 (four) times daily for 9 days.   zinc sulfate 220 (50 Zn)  MG capsule Take 1 capsule (220 mg total) by mouth daily for 14 days.   zolpidem 10 MG tablet Commonly known as: AMBIEN TAKE 1/2 TO 1 TABLET(5 TO 10 MG) BY MOUTH AT BEDTIME AS NEEDED What changed: See the new instructions.            Discharge Care Instructions  (From admission, onward)         Start     Ordered   05/31/20 0000  Discharge wound care:       Comments: Keep clean   05/31/20 9381          Follow-up Information    [name], MD Follow up in 1 week(s).   Specialty: Family Medicine Contact information: [address] [place] [address] [phone]              34 minutes Signed: Sharen Hones 05/31/2020, 9:53 AM

## 2020-05-31 NOTE — TOC Initial Note (Signed)
Transition of Care [facility]) - Initial/Assessment Note    Patient Details  Name: Amy [name] MRN: [id number] Date of Birth: 10/31/1950  [facility] CM/SW Contact:    Trecia Rogers, LCSW Phone Number: 05/31/2020, 10:35 AM  Clinical Narrative:                  70 y.o. female with medical history significant for ESRD on PD and previously on HD, hypertension, depression/anxiety, history of right hip fracture secondary to mechanical fall in December 2021, presented to the ED for chief concern of N/V/D for 1 week.  Patient currently has COVID and receives dialysis nightly at home. Patient reports that her husband helps her hook up. Patient reports that she is ready to go home and does not endorse any additional needs for CSW. Husband will be picking her up for discharge today.  PCP: Lelon Huh, MD Pharmacy: [facility].   Expected Discharge Plan: Home/Self Care Barriers to Discharge: No Barriers Identified   Patient Goals and CMS Choice Patient states their goals for this hospitalization and ongoing recovery are:: "go home" CMS Medicare.gov Compare Post Acute Care list provided to:: Patient Choice offered to / list presented to : Patient  Expected Discharge Plan and Services Expected Discharge Plan: Home/Self Care     Post Acute Care Choice: Dialysis Living arrangements for the past 2 months: Single Family Home Expected Discharge Date: 05/31/20                                    Prior Living Arrangements/Services Living arrangements for the past 2 months: Single Family Home Lives with:: Spouse Patient language and need for interpreter reviewed:: Yes Do you feel safe going back to the place where you live?: Yes      Need for Family Participation in Patient Care: Yes (Comment) (Patient's husband) Care giver support system in place?: Yes (comment) (Patient's husband) Current home services: Other (comment) (Dialysis) Criminal Activity/Legal Involvement  Pertinent to Current Situation/Hospitalization: No - Comment as needed  Activities of Daily Living Home Assistive Devices/Equipment: Walker (specify type) ADL Screening (condition at time of admission) Patient's cognitive ability adequate to safely complete daily activities?: Yes Is the patient deaf or have difficulty hearing?: No Does the patient have difficulty seeing, even when wearing glasses/contacts?: No Does the patient have difficulty concentrating, remembering, or making decisions?: No Patient able to express need for assistance with ADLs?: Yes Does the patient have difficulty dressing or bathing?: No Independently performs ADLs?: Yes (appropriate for developmental age) Does the patient have difficulty walking or climbing stairs?: Yes Weakness of Legs: None Weakness of Arms/Hands: None  Permission Sought/Granted                  Emotional Assessment Appearance:: Other (Comment Required (Unable to assess) Attitude/Demeanor/Rapport: Unable to Assess Affect (typically observed): Unable to Assess Orientation: : Oriented to Place,Oriented to  Time,Oriented to Situation,Oriented to Self Alcohol / Substance Use: Not Applicable Psych Involvement: No (comment)  Admission diagnosis:  Hypokalemia [E87.6] Nausea vomiting and diarrhea [R11.2, R19.7] COVID-19 [U07.1] Patient Active Problem List   Diagnosis Date Noted  . Gastroenteritis due to COVID-19 virus 05/29/2020  . Surgery, elective   . S/P ORIF (open reduction internal fixation) fracture   . Closed right hip fracture, initial encounter ([place]) 04/22/2020  . Hypotension 12/16/2019  . ESRD on dialysis ([place]) 12/16/2019  . Depression with anxiety 12/16/2019  .  Hypomagnesemia 12/16/2019  . Atrial flutter ([place])   . Atrial fibrillation with RVR ([place]) 04/03/2019  . Benign essential HTN 04/03/2019  . HLD (hyperlipidemia) 04/03/2019  . End stage renal disease ([place]) 12/13/2018  . Itching 11/08/2018  . Hyperparathyroidism,  secondary renal ([place]) 09/13/2017  . Ground glass opacity present on imaging of lung 04/08/2017  . Osteoarthritis of knee 12/29/2016  . Normocytic anemia 10/14/2016  . Intractable episodic headache   . Gastritis   . History of adenomatous polyp of colon 09/07/2016  . Iron deficiency anemia 09/05/2016  . Multiple thyroid nodules 07/04/2015  . Right thyroid nodule 06/27/2015  . Allergic rhinitis, seasonal 12/03/2014  . Fever blister 12/03/2014  . Herniated lumbar intervertebral disc 12/03/2014  . Hypokalemia 12/03/2014  . Recurrent sinus infections 12/03/2014  . Insomnia 11/05/2014  . Acquired scoliosis 06/26/2014  . Intervertebral disc stenosis of neural canal of lumbar region 06/20/2014  . Lumbar facet arthropathy 05/21/2014  . Degenerative spondylolisthesis 03/12/2014  . OP (osteoporosis) 08/10/2013  . Idiopathic peripheral neuropathy 03/24/2009  . Edema 12/27/2007  . Headache, tension-type 10/25/2006  . Essential hypertension 10/24/2006  . Anxiety 05/03/1998  . Esophageal reflux 05/03/1998   PCP:  [name], MD Pharmacy:   [facility] [phone] [address], [place] [address] [address] Phone: [phone] Fax: [phone]  [id number] [place], [place]  [address] [place] [address] Phone: [phone] Fax: [phone]  [id number] [place], [place] [address] [place] [address] Phone: [phone] Fax: [place] [place], [address] [place] [address] Phone: [phone] Fax: [phone]  [id number] - [place], [place] [address] [place] [address] [place] [address] Phone: [phone] Fax:  [phone]     Social Determinants of Health ([place]) Interventions    Readmission Risk Interventions No flowsheet data found.

## 2020-05-31 NOTE — TOC Transition Note (Signed)
Transition of Care [facility]) - CM/SW Discharge Note   Patient Details  Name: Amy [name] MRN: [id number] Date of Birth: 30-Dec-1950  Transition of Care [facility]) CM/SW Contact:  Trecia Rogers, LCSW Phone Number: 05/31/2020, 10:37 AM   Clinical Narrative:     Patient will be discharged today and will go home with nightly dialysis where her husband will help her hook up. Patient reports that her husband helps with wound care and clean up.   No other needs at his time.   Final next level of care: Home/Self Care Barriers to Discharge: No Barriers Identified   Patient Goals and CMS Choice Patient states their goals for this hospitalization and ongoing recovery are:: "go home" CMS Medicare.gov Compare Post Acute Care list provided to:: Patient Choice offered to / list presented to : Patient  Discharge Placement                       Discharge Plan and Services     Post Acute Care Choice: Dialysis                               Social Determinants of Health (SDOH) Interventions     Readmission Risk Interventions No flowsheet data found.

## 2020-05-31 NOTE — Progress Notes (Signed)
Amy [name]  MRN: [id number]  DOB/AGE: 03-Feb-1951 70 y.o.  Primary Care Physician:Fisher, Kirstie Peri, MD  Admit date: 05/29/2020  Chief Complaint:  Chief Complaint  Patient presents with  . Weakness    S-Pt presented on  05/29/2020 with  Chief Complaint  Patient presents with  . Weakness  .    Pt today feels better  Medications . sodium chloride   Intravenous Once  . acetaminophen  650 mg Oral Once  . vitamin C  500 mg Oral Daily  . buPROPion  75 mg Oral BID  . calcium acetate  667 mg Oral TID WC  . carvedilol  25 mg Oral BID WC  . Chlorhexidine Gluconate Cloth  6 each Topical Daily  . gentamicin cream  1 application Topical Daily  . heparin  5,000 Units Subcutaneous Q8H  . losartan  100 mg Oral QHS  . pantoprazole  40 mg Oral Daily  . potassium chloride  40 mEq Oral Q2H  . sodium bicarbonate  1,300 mg Oral BID  . sodium chloride  1 g Oral Daily  . vancomycin  125 mg Oral QID  . zinc sulfate  220 mg Oral Daily         WEX:HBZJI from the symptoms mentioned above,there are no other symptoms referable to all systems reviewed.  Physical Exam: Vital signs in last 24 hours: Temp:  [97.9 F (36.6 C)-99.3 F (37.4 C)] 98.7 F (37.1 C) (01/29 0758) Pulse Rate:  [79-92] 83 (01/29 0758) Resp:  [14-20] 16 (01/29 0758) BP: (109-145)/(65-75) 145/73 (01/29 0758) SpO2:  [97 %-100 %] 97 % (01/29 0758) Weight:  [50.8 kg] 50.8 kg (01/29 0545) Weight change: 3.172 kg Last BM Date: 05/31/20  Intake/Output from previous day: 01/28 0701 - 01/29 0700 In: 970 [I.V.:50; Blood:920] Out: 200 [Urine:200] No intake/output data recorded.   Physical Exam:  General- pt is awake,alert, oriented to time place and person  Resp- No acute REsp distress, CTA B/L NO Rhonchi  CVS- S1S2 regular in rate and rhythm  GIT- BS+, soft, Non tender , Non distended  EXT- No LE Edema,  No Cyanosis  Access-PD catheter in situ   Lab Results:  CBC  Recent Labs    05/30/20 0205  05/31/20 0431  WBC 8.8 9.5  HGB 7.3* 10.3*  HCT 20.8* 28.4*  PLT 298 304    BMET  Recent Labs    05/30/20 0256 05/31/20 0431  NA 126* 129*  K 3.4* 2.9*  CL 90* 94*  CO2 19* 21*  GLUCOSE 105* 124*  BUN 50* 43*  CREATININE 5.78* 5.06*  CALCIUM 7.5* 8.0*      Most recent Creatinine trend  Lab Results  Component Value Date   CREATININE 5.06 (H) 05/31/2020   CREATININE 5.78 (H) 05/30/2020   CREATININE 5.80 (H) 05/29/2020      MICRO   Recent Results (from the past 240 hour(s))  SARS Coronavirus 2 by RT PCR (hospital order, performed in [facility] hospital lab) Nasopharyngeal Nasopharyngeal Swab     Status: Abnormal   Collection Time: 05/29/20 12:15 PM   Specimen: Nasopharyngeal Swab  Result Value Ref Range Status   SARS Coronavirus 2 POSITIVE (A) NEGATIVE Final    Comment: RESULT CALLED TO, READ BACK BY AND VERIFIED WITH:  Gwynn Burly AT 9678 05/29/20 SDR (NOTE) SARS-CoV-2 target nucleic acids are DETECTED  SARS-CoV-2 RNA is generally detectable in upper respiratory specimens  during the acute phase of infection.  Positive results are indicative  of the  presence of the identified virus, but do not rule out bacterial infection or co-infection with other pathogens not detected by the test.  Clinical correlation with patient history and  other diagnostic information is necessary to determine patient infection status.  The expected result is negative.  Fact Sheet for Patients:   StrictlyIdeas.no   Fact Sheet for Healthcare Providers:   BankingDealers.co.za    This test is not yet approved or cleared by the [place] FDA and  has been authorized for detection and/or diagnosis of SARS-CoV-2 by FDA under an Emergency Use Authorization (EUA).  This EUA will remain in effect (meaning this test c an be used) for the duration of  the COVID-19 declaration under Section 564(b)(1) of the Act, 21 U.S.C. section  360-bbb-3(b)(1), unless the authorization is terminated or revoked sooner.  Performed at [facility], [address] [place], [place] [address]   Culture, blood (Routine X 2) w Reflex to ID Panel     Status: None (Preliminary result)   Collection Time: 05/29/20  8:30 PM   Specimen: BLOOD  Result Value Ref Range Status   Specimen Description BLOOD RIGHT ANTECUBITAL  Final   Special Requests   Final    BOTTLES DRAWN AEROBIC AND ANAEROBIC Blood Culture adequate volume   Culture   Final    NO GROWTH 2 DAYS Performed at [facility], [address], [place], [place] [address]    Report Status PENDING  Incomplete  Culture, blood (Routine X 2) w Reflex to ID Panel     Status: None (Preliminary result)   Collection Time: 05/29/20  8:43 PM   Specimen: BLOOD  Result Value Ref Range Status   Specimen Description BLOOD BLOOD RIGHT HAND  Final   Special Requests   Final    BOTTLES DRAWN AEROBIC AND ANAEROBIC Blood Culture adequate volume   Culture   Final    NO GROWTH 2 DAYS Performed at [facility], [address], [place], [place] [address]    Report Status PENDING  Incomplete  C Difficile Quick Screen w PCR reflex     Status: Abnormal   Collection Time: 05/30/20  9:17 AM   Specimen: STOOL  Result Value Ref Range Status   C Diff antigen POSITIVE (A) NEGATIVE Final   C Diff toxin NEGATIVE NEGATIVE Final   C Diff interpretation Results are indeterminate. See PCR results.  Final    Comment: Performed at [facility], [address] [place], [place] [address]  C. Diff by PCR, Reflexed     Status: Abnormal   Collection Time: 05/30/20  9:17 AM  Result Value Ref Range Status   Toxigenic C. Difficile by PCR POSITIVE (A) NEGATIVE Final    Comment: Positive for toxigenic C. difficile with little to no toxin production. Only treat if clinical presentation suggests symptomatic illness. Performed at [facility], [address]  [place], [place] [address]          Impression:   [name] is a 70 y.o. white female with end stage renal disease on peritoneal dialysis, atrial fibrillation, depression, GERD, who is admitted to [facility] on 05/29/2020 for Hypokalemia [E87.6] Nausea vomiting and diarrhea [R11.2, R19.7] COVID-19 [U07.1]  [facility] peritoneal dialysis 51kg [name] 8 hours 4 exchanges 2027mL fills     1)Renal    End-stage renal disease Patient is on peritoneal dialysis Patient remains on peritoneal dialysis as inpatient   2)HTN    Blood pressure is  stable    3)Anemia of chronic disease  CBC Latest Ref Rng & Units 05/31/2020 05/30/2020 05/29/2020  WBC 4.0 - 10.5 K/uL 9.5 8.8 11.0(H)  Hemoglobin 12.0 - 15.0 g/dL 10.3(L) 7.3(L) 9.3(L)  Hematocrit 36.0 - 46.0 % 28.4(L) 20.8(L) 26.4(L)  Platelets 150 - 400 K/uL 304 298 359       HGb is now at goal (9--11) Patient did receive PRBC during the inpatient stay  4) Secondary hyperparathyroidism -CKD Mineral-Bone Disorder    Lab Results  Component Value Date   CALCIUM 8.0 (L) 05/31/2020   PHOS 6.1 (H) 11/08/2018    Secondary Hyperparathyroidism present Phosphorus is not at goal. Patient is on binders  5) COVID-19 Primary team is following  6) Electrolytes   BMP Latest Ref Rng & Units 05/31/2020 05/30/2020 05/29/2020  Glucose 70 - 99 mg/dL 124(H) 105(H) 98  BUN 8 - 23 mg/dL 43(H) 50(H) 46(H)  Creatinine 0.44 - 1.00 mg/dL 5.06(H) 5.78(H) 5.80(H)  BUN/Creat Ratio 12 - 28 - - -  Sodium 135 - 145 mmol/L 129(L) 126(L) 125(L)  Potassium 3.5 - 5.1 mmol/L 2.9(L) 3.4(L) 2.9(L)  Chloride 98 - 111 mmol/L 94(L) 90(L) 86(L)  CO2 22 - 32 mmol/L 21(L) 19(L) 18(L)  Calcium 8.9 - 10.3 mg/dL 8.0(L) 7.5(L) 7.8(L)     Sodium Hyponatremia Secondary to GI losses and ESRD Inability to diuresis free water Patient is on salt tablets  Potassium Hypokalemia Secondary to GI losses Being replete   7)Acid base    Co2 is not at  goal Secondary to GI losses Patient is on p.o. bicarb   Plan:   We will continue p.o. salt tablets, p.o. sodium bicarb and potassium chloride We will continue patient on PD     Shenicka Sunderlin s Antionne Enrique 05/31/2020, 9:44 AM

## 2020-06-01 DIAGNOSIS — N186 End stage renal disease: Secondary | ICD-10-CM | POA: Diagnosis not present

## 2020-06-01 DIAGNOSIS — Z992 Dependence on renal dialysis: Secondary | ICD-10-CM | POA: Diagnosis not present

## 2020-06-02 ENCOUNTER — Telehealth: Payer: Self-pay

## 2020-06-02 DIAGNOSIS — N186 End stage renal disease: Secondary | ICD-10-CM | POA: Diagnosis not present

## 2020-06-02 DIAGNOSIS — Z992 Dependence on renal dialysis: Secondary | ICD-10-CM | POA: Diagnosis not present

## 2020-06-02 NOTE — Telephone Encounter (Signed)
Patient went ahead and paid for the medication out of pocket per pharmacist.

## 2020-06-02 NOTE — Telephone Encounter (Signed)
Transition Care Management Follow-up Telephone Call  Date of discharge and from where: [facility] on 05/31/20  How have you been since you were released from the hospital? Doing better and resting well. Pt states appetite is ok but not back to normal yet. Pt states she still has hip pain but it is healing (from previous hip sx on 04/23/20). Declines pain, fever, chills, weakness or n/v/d.  Any questions or concerns? No   Items Reviewed:  Did the pt receive and understand the discharge instructions provided? Yes   Medications obtained and verified? Yes   Any new allergies since your discharge? No   Dietary orders reviewed? Yes  Do you have support at home? Yes   Other (ie: DME, Home Health, etc): N/A  Functional Questionnaire: (I = Independent and D = Dependent)  Bathing/Dressing- I   Meal Prep- I  Eating- I  Maintaining continence- I  Transferring/Ambulation- D, uses a walker currently due to previous hip sx.   Managing Meds- I   Follow up appointments reviewed:    PCP Hospital f/u appt confirmed? No  , pt declined scheduling a follow up with PCP at this time.   [facility] f/u appt confirmed? N/A  Are transportation arrangements needed? No   If their condition worsens, is the pt aware to call  their PCP or go to the ED? Yes  Was the patient provided with contact information for the PCP's office or ED? Yes  Was the pt encouraged to call back with questions or concerns? Yes

## 2020-06-02 NOTE — Telephone Encounter (Signed)
No HFU scheduled at this time. 

## 2020-06-03 ENCOUNTER — Telehealth: Payer: Self-pay

## 2020-06-03 DIAGNOSIS — N186 End stage renal disease: Secondary | ICD-10-CM | POA: Diagnosis not present

## 2020-06-03 DIAGNOSIS — Z992 Dependence on renal dialysis: Secondary | ICD-10-CM | POA: Diagnosis not present

## 2020-06-03 LAB — CULTURE, BLOOD (ROUTINE X 2)
Culture: NO GROWTH
Culture: NO GROWTH
Special Requests: ADEQUATE
Special Requests: ADEQUATE

## 2020-06-03 NOTE — Telephone Encounter (Signed)
Vancomycin is the standard of care for c.diff. why is she requesting an alternative?

## 2020-06-03 NOTE — Telephone Encounter (Signed)
Copied from [place] [phone]. Topic: General - Other >> Jun 03, 2020  1:12 PM Tessa Lerner A wrote: Reason for CRM: Patient was originally prescribed vancomycin (VANCOCIN) 125 MG capsule by Sharen Hones, MD sent in to patient's preferred [facility] pharmacy Patient's pharmacy has notified them that they are out of the medication. Patient is requesting to be prescribed an alternative by PCP (as directed by pharmacy) Patient declined to make an appointment at the time of call with agent

## 2020-06-04 DIAGNOSIS — Z992 Dependence on renal dialysis: Secondary | ICD-10-CM | POA: Diagnosis not present

## 2020-06-04 DIAGNOSIS — N186 End stage renal disease: Secondary | ICD-10-CM | POA: Diagnosis not present

## 2020-06-04 NOTE — Telephone Encounter (Signed)
I called and spoke with patient. She says the pharmacy doesn't have Vancomycin in stock and the pharmacist told her it would be difficult for them to try getting because its used more in a hospital setting. Pharmacist also told patient that it would cost her $800 for that prescription. Patient says the pharmacist recommend that she call her PCP and request to have it changed to a different medication that you are familiar with that is more cost effective and appropriate for her treatment.

## 2020-06-05 DIAGNOSIS — Z992 Dependence on renal dialysis: Secondary | ICD-10-CM | POA: Diagnosis not present

## 2020-06-05 DIAGNOSIS — N186 End stage renal disease: Secondary | ICD-10-CM | POA: Diagnosis not present

## 2020-06-05 MED ORDER — METRONIDAZOLE 500 MG PO TABS: 500.0000 mg | ORAL_TABLET | Freq: Three times a day (TID) | ORAL | 0 refills | Status: AC

## 2020-06-05 NOTE — Addendum Note (Signed)
Addended by: [name] on: 06/05/2020 08:12 AM   Modules accepted: Orders

## 2020-06-06 DIAGNOSIS — N186 End stage renal disease: Secondary | ICD-10-CM | POA: Diagnosis not present

## 2020-06-06 DIAGNOSIS — Z992 Dependence on renal dialysis: Secondary | ICD-10-CM | POA: Diagnosis not present

## 2020-06-07 DIAGNOSIS — N186 End stage renal disease: Secondary | ICD-10-CM | POA: Diagnosis not present

## 2020-06-07 DIAGNOSIS — Z992 Dependence on renal dialysis: Secondary | ICD-10-CM | POA: Diagnosis not present

## 2020-06-08 DIAGNOSIS — Z992 Dependence on renal dialysis: Secondary | ICD-10-CM | POA: Diagnosis not present

## 2020-06-08 DIAGNOSIS — N186 End stage renal disease: Secondary | ICD-10-CM | POA: Diagnosis not present

## 2020-06-09 DIAGNOSIS — Z992 Dependence on renal dialysis: Secondary | ICD-10-CM | POA: Diagnosis not present

## 2020-06-09 DIAGNOSIS — N186 End stage renal disease: Secondary | ICD-10-CM | POA: Diagnosis not present

## 2020-06-10 DIAGNOSIS — N186 End stage renal disease: Secondary | ICD-10-CM | POA: Diagnosis not present

## 2020-06-10 DIAGNOSIS — Z992 Dependence on renal dialysis: Secondary | ICD-10-CM | POA: Diagnosis not present

## 2020-06-11 DIAGNOSIS — Z992 Dependence on renal dialysis: Secondary | ICD-10-CM | POA: Diagnosis not present

## 2020-06-11 DIAGNOSIS — N186 End stage renal disease: Secondary | ICD-10-CM | POA: Diagnosis not present

## 2020-06-12 DIAGNOSIS — N186 End stage renal disease: Secondary | ICD-10-CM | POA: Diagnosis not present

## 2020-06-12 DIAGNOSIS — Z992 Dependence on renal dialysis: Secondary | ICD-10-CM | POA: Diagnosis not present

## 2020-06-13 DIAGNOSIS — N186 End stage renal disease: Secondary | ICD-10-CM | POA: Diagnosis not present

## 2020-06-13 DIAGNOSIS — Z992 Dependence on renal dialysis: Secondary | ICD-10-CM | POA: Diagnosis not present

## 2020-06-13 DIAGNOSIS — S72144D Nondisplaced intertrochanteric fracture of right femur, subsequent encounter for closed fracture with routine healing: Secondary | ICD-10-CM | POA: Diagnosis not present

## 2020-06-14 DIAGNOSIS — N186 End stage renal disease: Secondary | ICD-10-CM | POA: Diagnosis not present

## 2020-06-14 DIAGNOSIS — Z992 Dependence on renal dialysis: Secondary | ICD-10-CM | POA: Diagnosis not present

## 2020-06-15 DIAGNOSIS — Z992 Dependence on renal dialysis: Secondary | ICD-10-CM | POA: Diagnosis not present

## 2020-06-15 DIAGNOSIS — N186 End stage renal disease: Secondary | ICD-10-CM | POA: Diagnosis not present

## 2020-06-16 DIAGNOSIS — N186 End stage renal disease: Secondary | ICD-10-CM | POA: Diagnosis not present

## 2020-06-16 DIAGNOSIS — Z992 Dependence on renal dialysis: Secondary | ICD-10-CM | POA: Diagnosis not present

## 2020-06-17 DIAGNOSIS — N186 End stage renal disease: Secondary | ICD-10-CM | POA: Diagnosis not present

## 2020-06-17 DIAGNOSIS — Z992 Dependence on renal dialysis: Secondary | ICD-10-CM | POA: Diagnosis not present

## 2020-06-18 DIAGNOSIS — Z992 Dependence on renal dialysis: Secondary | ICD-10-CM | POA: Diagnosis not present

## 2020-06-18 DIAGNOSIS — N186 End stage renal disease: Secondary | ICD-10-CM | POA: Diagnosis not present

## 2020-06-19 ENCOUNTER — Emergency Department (HOSPITAL_COMMUNITY): Payer: Medicare Other

## 2020-06-19 ENCOUNTER — Encounter (HOSPITAL_COMMUNITY): Payer: Self-pay

## 2020-06-19 ENCOUNTER — Inpatient Hospital Stay (HOSPITAL_COMMUNITY)
Admission: EM | Admit: 2020-06-19 | Discharge: 2020-07-04 | DRG: 981 | Disposition: A | Discharge status: Rehabilitation Facility | Payer: Medicare Other | Attending: Internal Medicine | Admitting: Internal Medicine

## 2020-06-19 DIAGNOSIS — R29818 Other symptoms and signs involving the nervous system: Secondary | ICD-10-CM | POA: Diagnosis not present

## 2020-06-19 DIAGNOSIS — R531 Weakness: Secondary | ICD-10-CM | POA: Diagnosis not present

## 2020-06-19 DIAGNOSIS — R410 Disorientation, unspecified: Secondary | ICD-10-CM | POA: Diagnosis not present

## 2020-06-19 DIAGNOSIS — I4892 Unspecified atrial flutter: Secondary | ICD-10-CM | POA: Diagnosis not present

## 2020-06-19 DIAGNOSIS — Z9049 Acquired absence of other specified parts of digestive tract: Secondary | ICD-10-CM | POA: Diagnosis not present

## 2020-06-19 DIAGNOSIS — Z6822 Body mass index (BMI) 22.0-22.9, adult: Secondary | ICD-10-CM

## 2020-06-19 DIAGNOSIS — N186 End stage renal disease: Secondary | ICD-10-CM | POA: Diagnosis not present

## 2020-06-19 DIAGNOSIS — Z79899 Other long term (current) drug therapy: Secondary | ICD-10-CM | POA: Diagnosis not present

## 2020-06-19 DIAGNOSIS — Z955 Presence of coronary angioplasty implant and graft: Secondary | ICD-10-CM

## 2020-06-19 DIAGNOSIS — I6389 Other cerebral infarction: Secondary | ICD-10-CM | POA: Diagnosis not present

## 2020-06-19 DIAGNOSIS — R197 Diarrhea, unspecified: Secondary | ICD-10-CM | POA: Diagnosis not present

## 2020-06-19 DIAGNOSIS — Z8619 Personal history of other infectious and parasitic diseases: Secondary | ICD-10-CM | POA: Diagnosis not present

## 2020-06-19 DIAGNOSIS — I2510 Atherosclerotic heart disease of native coronary artery without angina pectoris: Secondary | ICD-10-CM | POA: Diagnosis present

## 2020-06-19 DIAGNOSIS — E46 Unspecified protein-calorie malnutrition: Secondary | ICD-10-CM | POA: Diagnosis not present

## 2020-06-19 DIAGNOSIS — Z800 Family history of malignant neoplasm of digestive organs: Secondary | ICD-10-CM | POA: Diagnosis not present

## 2020-06-19 DIAGNOSIS — Z881 Allergy status to other antibiotic agents status: Secondary | ICD-10-CM

## 2020-06-19 DIAGNOSIS — Z8616 Personal history of COVID-19: Secondary | ICD-10-CM | POA: Diagnosis not present

## 2020-06-19 DIAGNOSIS — R404 Transient alteration of awareness: Secondary | ICD-10-CM | POA: Diagnosis not present

## 2020-06-19 DIAGNOSIS — K3184 Gastroparesis: Secondary | ICD-10-CM | POA: Diagnosis not present

## 2020-06-19 DIAGNOSIS — D631 Anemia in chronic kidney disease: Secondary | ICD-10-CM | POA: Diagnosis not present

## 2020-06-19 DIAGNOSIS — G894 Chronic pain syndrome: Secondary | ICD-10-CM | POA: Diagnosis not present

## 2020-06-19 DIAGNOSIS — J9811 Atelectasis: Secondary | ICD-10-CM | POA: Diagnosis not present

## 2020-06-19 DIAGNOSIS — R0602 Shortness of breath: Secondary | ICD-10-CM | POA: Diagnosis not present

## 2020-06-19 DIAGNOSIS — I252 Old myocardial infarction: Secondary | ICD-10-CM

## 2020-06-19 DIAGNOSIS — I214 Non-ST elevation (NSTEMI) myocardial infarction: Secondary | ICD-10-CM

## 2020-06-19 DIAGNOSIS — Z01810 Encounter for preprocedural cardiovascular examination: Secondary | ICD-10-CM | POA: Diagnosis not present

## 2020-06-19 DIAGNOSIS — R29810 Facial weakness: Secondary | ICD-10-CM | POA: Diagnosis not present

## 2020-06-19 DIAGNOSIS — L89151 Pressure ulcer of sacral region, stage 1: Secondary | ICD-10-CM | POA: Diagnosis present

## 2020-06-19 DIAGNOSIS — R000 Tachycardia, unspecified: Secondary | ICD-10-CM | POA: Diagnosis present

## 2020-06-19 DIAGNOSIS — I6523 Occlusion and stenosis of bilateral carotid arteries: Secondary | ICD-10-CM | POA: Diagnosis not present

## 2020-06-19 DIAGNOSIS — Z880 Allergy status to penicillin: Secondary | ICD-10-CM | POA: Diagnosis not present

## 2020-06-19 DIAGNOSIS — Z8249 Family history of ischemic heart disease and other diseases of the circulatory system: Secondary | ICD-10-CM

## 2020-06-19 DIAGNOSIS — I6502 Occlusion and stenosis of left vertebral artery: Secondary | ICD-10-CM | POA: Diagnosis not present

## 2020-06-19 DIAGNOSIS — E8809 Other disorders of plasma-protein metabolism, not elsewhere classified: Secondary | ICD-10-CM | POA: Diagnosis not present

## 2020-06-19 DIAGNOSIS — E872 Acidosis: Secondary | ICD-10-CM | POA: Diagnosis present

## 2020-06-19 DIAGNOSIS — Z888 Allergy status to other drugs, medicaments and biological substances status: Secondary | ICD-10-CM

## 2020-06-19 DIAGNOSIS — Z20822 Contact with and (suspected) exposure to covid-19: Secondary | ICD-10-CM | POA: Diagnosis present

## 2020-06-19 DIAGNOSIS — R7989 Other specified abnormal findings of blood chemistry: Secondary | ICD-10-CM | POA: Diagnosis not present

## 2020-06-19 DIAGNOSIS — I2699 Other pulmonary embolism without acute cor pulmonale: Secondary | ICD-10-CM | POA: Diagnosis not present

## 2020-06-19 DIAGNOSIS — R414 Neurologic neglect syndrome: Secondary | ICD-10-CM | POA: Diagnosis present

## 2020-06-19 DIAGNOSIS — R569 Unspecified convulsions: Secondary | ICD-10-CM | POA: Diagnosis not present

## 2020-06-19 DIAGNOSIS — Z90710 Acquired absence of both cervix and uterus: Secondary | ICD-10-CM | POA: Diagnosis not present

## 2020-06-19 DIAGNOSIS — E871 Hypo-osmolality and hyponatremia: Secondary | ICD-10-CM | POA: Diagnosis present

## 2020-06-19 DIAGNOSIS — I10 Essential (primary) hypertension: Secondary | ICD-10-CM | POA: Diagnosis present

## 2020-06-19 DIAGNOSIS — E876 Hypokalemia: Secondary | ICD-10-CM | POA: Diagnosis present

## 2020-06-19 DIAGNOSIS — I255 Ischemic cardiomyopathy: Secondary | ICD-10-CM

## 2020-06-19 DIAGNOSIS — Z7902 Long term (current) use of antithrombotics/antiplatelets: Secondary | ICD-10-CM | POA: Diagnosis not present

## 2020-06-19 DIAGNOSIS — E785 Hyperlipidemia, unspecified: Secondary | ICD-10-CM | POA: Diagnosis not present

## 2020-06-19 DIAGNOSIS — G4700 Insomnia, unspecified: Secondary | ICD-10-CM | POA: Diagnosis present

## 2020-06-19 DIAGNOSIS — R778 Other specified abnormalities of plasma proteins: Secondary | ICD-10-CM | POA: Diagnosis not present

## 2020-06-19 DIAGNOSIS — R0902 Hypoxemia: Secondary | ICD-10-CM | POA: Diagnosis not present

## 2020-06-19 DIAGNOSIS — R188 Other ascites: Secondary | ICD-10-CM | POA: Diagnosis not present

## 2020-06-19 DIAGNOSIS — I5043 Acute on chronic combined systolic (congestive) and diastolic (congestive) heart failure: Secondary | ICD-10-CM | POA: Diagnosis not present

## 2020-06-19 DIAGNOSIS — I5021 Acute systolic (congestive) heart failure: Secondary | ICD-10-CM | POA: Diagnosis not present

## 2020-06-19 DIAGNOSIS — J90 Pleural effusion, not elsewhere classified: Secondary | ICD-10-CM | POA: Diagnosis not present

## 2020-06-19 DIAGNOSIS — R9431 Abnormal electrocardiogram [ECG] [EKG]: Secondary | ICD-10-CM | POA: Diagnosis not present

## 2020-06-19 DIAGNOSIS — Z882 Allergy status to sulfonamides status: Secondary | ICD-10-CM

## 2020-06-19 DIAGNOSIS — I21A1 Myocardial infarction type 2: Secondary | ICD-10-CM | POA: Diagnosis not present

## 2020-06-19 DIAGNOSIS — L8990 Pressure ulcer of unspecified site, unspecified stage: Secondary | ICD-10-CM | POA: Insufficient documentation

## 2020-06-19 DIAGNOSIS — A0472 Enterocolitis due to Clostridium difficile, not specified as recurrent: Secondary | ICD-10-CM | POA: Diagnosis not present

## 2020-06-19 DIAGNOSIS — I132 Hypertensive heart and chronic kidney disease with heart failure and with stage 5 chronic kidney disease, or end stage renal disease: Secondary | ICD-10-CM | POA: Diagnosis present

## 2020-06-19 DIAGNOSIS — D638 Anemia in other chronic diseases classified elsewhere: Secondary | ICD-10-CM | POA: Diagnosis not present

## 2020-06-19 DIAGNOSIS — I5042 Chronic combined systolic (congestive) and diastolic (congestive) heart failure: Secondary | ICD-10-CM | POA: Diagnosis not present

## 2020-06-19 DIAGNOSIS — R5381 Other malaise: Secondary | ICD-10-CM | POA: Diagnosis not present

## 2020-06-19 DIAGNOSIS — Z86010 Personal history of colonic polyps: Secondary | ICD-10-CM | POA: Diagnosis not present

## 2020-06-19 DIAGNOSIS — R112 Nausea with vomiting, unspecified: Secondary | ICD-10-CM | POA: Diagnosis not present

## 2020-06-19 DIAGNOSIS — Z9181 History of falling: Secondary | ICD-10-CM

## 2020-06-19 DIAGNOSIS — E860 Dehydration: Secondary | ICD-10-CM | POA: Diagnosis present

## 2020-06-19 DIAGNOSIS — Z681 Body mass index (BMI) 19 or less, adult: Secondary | ICD-10-CM | POA: Diagnosis not present

## 2020-06-19 DIAGNOSIS — B001 Herpesviral vesicular dermatitis: Secondary | ICD-10-CM | POA: Diagnosis present

## 2020-06-19 DIAGNOSIS — K219 Gastro-esophageal reflux disease without esophagitis: Secondary | ICD-10-CM | POA: Diagnosis present

## 2020-06-19 DIAGNOSIS — Z992 Dependence on renal dialysis: Secondary | ICD-10-CM | POA: Diagnosis not present

## 2020-06-19 DIAGNOSIS — S40021A Contusion of right upper arm, initial encounter: Secondary | ICD-10-CM | POA: Diagnosis not present

## 2020-06-19 DIAGNOSIS — F418 Other specified anxiety disorders: Secondary | ICD-10-CM | POA: Diagnosis present

## 2020-06-19 DIAGNOSIS — I120 Hypertensive chronic kidney disease with stage 5 chronic kidney disease or end stage renal disease: Secondary | ICD-10-CM | POA: Diagnosis not present

## 2020-06-19 DIAGNOSIS — I5041 Acute combined systolic (congestive) and diastolic (congestive) heart failure: Secondary | ICD-10-CM | POA: Diagnosis not present

## 2020-06-19 DIAGNOSIS — R159 Full incontinence of feces: Secondary | ICD-10-CM | POA: Diagnosis present

## 2020-06-19 DIAGNOSIS — Z7982 Long term (current) use of aspirin: Secondary | ICD-10-CM | POA: Diagnosis not present

## 2020-06-19 DIAGNOSIS — T148XXA Other injury of unspecified body region, initial encounter: Secondary | ICD-10-CM | POA: Diagnosis not present

## 2020-06-19 DIAGNOSIS — I951 Orthostatic hypotension: Secondary | ICD-10-CM | POA: Diagnosis not present

## 2020-06-19 DIAGNOSIS — W1830XA Fall on same level, unspecified, initial encounter: Secondary | ICD-10-CM | POA: Diagnosis present

## 2020-06-19 DIAGNOSIS — I6782 Cerebral ischemia: Secondary | ICD-10-CM | POA: Diagnosis not present

## 2020-06-19 DIAGNOSIS — E43 Unspecified severe protein-calorie malnutrition: Secondary | ICD-10-CM | POA: Diagnosis present

## 2020-06-19 DIAGNOSIS — G319 Degenerative disease of nervous system, unspecified: Secondary | ICD-10-CM | POA: Diagnosis not present

## 2020-06-19 DIAGNOSIS — I248 Other forms of acute ischemic heart disease: Secondary | ICD-10-CM | POA: Diagnosis not present

## 2020-06-19 DIAGNOSIS — E8889 Other specified metabolic disorders: Secondary | ICD-10-CM | POA: Diagnosis present

## 2020-06-19 DIAGNOSIS — F419 Anxiety disorder, unspecified: Secondary | ICD-10-CM | POA: Diagnosis present

## 2020-06-19 DIAGNOSIS — N2581 Secondary hyperparathyroidism of renal origin: Secondary | ICD-10-CM | POA: Diagnosis present

## 2020-06-19 DIAGNOSIS — R110 Nausea: Secondary | ICD-10-CM | POA: Diagnosis not present

## 2020-06-19 LAB — CBC
HCT: 25.6 % — ABNORMAL LOW (ref 36.0–46.0)
Hemoglobin: 9 g/dL — ABNORMAL LOW (ref 12.0–15.0)
MCH: 30.8 pg (ref 26.0–34.0)
MCHC: 35.2 g/dL (ref 30.0–36.0)
MCV: 87.7 fL (ref 80.0–100.0)
Platelets: 443 10*3/uL — ABNORMAL HIGH (ref 150–400)
RBC: 2.92 MIL/uL — ABNORMAL LOW (ref 3.87–5.11)
RDW: 16.5 % — ABNORMAL HIGH (ref 11.5–15.5)
WBC: 16.7 10*3/uL — ABNORMAL HIGH (ref 4.0–10.5)
nRBC: 0 % (ref 0.0–0.2)

## 2020-06-19 LAB — I-STAT CHEM 8, ED
BUN: 39 mg/dL — ABNORMAL HIGH (ref 8–23)
Calcium, Ion: 0.91 mmol/L — ABNORMAL LOW (ref 1.15–1.40)
Chloride: 83 mmol/L — ABNORMAL LOW (ref 98–111)
Creatinine, Ser: 4.7 mg/dL — ABNORMAL HIGH (ref 0.44–1.00)
Glucose, Bld: 150 mg/dL — ABNORMAL HIGH (ref 70–99)
HCT: 27 % — ABNORMAL LOW (ref 36.0–46.0)
Hemoglobin: 9.2 g/dL — ABNORMAL LOW (ref 12.0–15.0)
Potassium: 2.1 mmol/L — CL (ref 3.5–5.1)
Sodium: 122 mmol/L — ABNORMAL LOW (ref 135–145)
TCO2: 18 mmol/L — ABNORMAL LOW (ref 22–32)

## 2020-06-19 LAB — DIFFERENTIAL
Abs Immature Granulocytes: 0.19 10*3/uL — ABNORMAL HIGH (ref 0.00–0.07)
Basophils Absolute: 0 10*3/uL (ref 0.0–0.1)
Basophils Relative: 0 %
Eosinophils Absolute: 0 10*3/uL (ref 0.0–0.5)
Eosinophils Relative: 0 %
Immature Granulocytes: 1 %
Lymphocytes Relative: 7 %
Lymphs Abs: 1.1 10*3/uL (ref 0.7–4.0)
Monocytes Absolute: 1 10*3/uL (ref 0.1–1.0)
Monocytes Relative: 6 %
Neutro Abs: 14.4 10*3/uL — ABNORMAL HIGH (ref 1.7–7.7)
Neutrophils Relative %: 86 %

## 2020-06-19 LAB — COMPREHENSIVE METABOLIC PANEL
ALT: 27 U/L (ref 0–44)
AST: 38 U/L (ref 15–41)
Albumin: 2 g/dL — ABNORMAL LOW (ref 3.5–5.0)
Alkaline Phosphatase: 86 U/L (ref 38–126)
Anion gap: 22 — ABNORMAL HIGH (ref 5–15)
BUN: 36 mg/dL — ABNORMAL HIGH (ref 8–23)
CO2: 15 mmol/L — ABNORMAL LOW (ref 22–32)
Calcium: 7.3 mg/dL — ABNORMAL LOW (ref 8.9–10.3)
Chloride: 86 mmol/L — ABNORMAL LOW (ref 98–111)
Creatinine, Ser: 5.01 mg/dL — ABNORMAL HIGH (ref 0.44–1.00)
GFR, Estimated: 9 mL/min — ABNORMAL LOW (ref >60)
Glucose, Bld: 158 mg/dL — ABNORMAL HIGH (ref 70–99)
Potassium: 2.1 mmol/L — CL (ref 3.5–5.1)
Sodium: 123 mmol/L — ABNORMAL LOW (ref 135–145)
Total Bilirubin: 0.5 mg/dL (ref 0.3–1.2)
Total Protein: 4 g/dL — ABNORMAL LOW (ref 6.5–8.1)

## 2020-06-19 LAB — PROTIME-INR
INR: 1.1 (ref 0.8–1.2)
Prothrombin Time: 13.3 seconds (ref 11.4–15.2)

## 2020-06-19 LAB — CBG MONITORING, ED: Glucose-Capillary: 140 mg/dL — ABNORMAL HIGH (ref 70–99)

## 2020-06-19 LAB — APTT: aPTT: 24 seconds (ref 24–36)

## 2020-06-19 MED ORDER — LORAZEPAM 2 MG/ML IJ SOLN: 2.0000 mg | Freq: Once | INTRAMUSCULAR | Status: AC

## 2020-06-19 MED ORDER — ACETAMINOPHEN 325 MG PO TABS
650.0000 mg | ORAL_TABLET | Freq: Four times a day (QID) | ORAL | Status: DC | PRN
Start: 2020-06-19 — End: 2020-06-25

## 2020-06-19 MED ORDER — HEPARIN SODIUM (PORCINE) 5000 UNIT/ML IJ SOLN
5000.0000 [IU] | Freq: Three times a day (TID) | INTRAMUSCULAR | Status: DC
  Administered 2020-06-19 – 2020-06-25 (×17): 5000 [IU] via SUBCUTANEOUS
  Filled 2020-06-19 (×17): qty 1

## 2020-06-19 MED ORDER — POTASSIUM CHLORIDE 10 MEQ/100ML IV SOLN
10.0000 meq | Freq: Once | INTRAVENOUS | Status: AC
  Filled 2020-06-19: qty 100

## 2020-06-19 MED ORDER — SODIUM CHLORIDE 0.9 % IV BOLUS
1000.0000 mL | Freq: Once | INTRAVENOUS | Status: AC
  Administered 2020-06-19: 1000 mL via INTRAVENOUS

## 2020-06-19 MED ORDER — POTASSIUM CHLORIDE 10 MEQ/100ML IV SOLN
10.0000 meq | Freq: Once | INTRAVENOUS | Status: AC
  Administered 2020-06-19: 10 meq via INTRAVENOUS

## 2020-06-19 MED ORDER — IOHEXOL 350 MG/ML SOLN
75.0000 mL | Freq: Once | INTRAVENOUS | Status: AC | PRN
  Administered 2020-06-19: 75 mL via INTRAVENOUS

## 2020-06-19 MED ORDER — POTASSIUM CHLORIDE IN NACL 20-0.9 MEQ/L-% IV SOLN
INTRAVENOUS | Status: DC
  Filled 2020-06-19 (×2): qty 1000

## 2020-06-19 MED ORDER — SODIUM CHLORIDE 0.9% FLUSH: 3.0000 mL | Freq: Once | INTRAVENOUS | Status: DC

## 2020-06-19 MED ORDER — FIDAXOMICIN 200 MG PO TABS
200.0000 mg | ORAL_TABLET | Freq: Two times a day (BID) | ORAL | Status: DC
  Administered 2020-06-19 – 2020-06-29 (×18): 200 mg via ORAL
  Filled 2020-06-19 (×22): qty 1

## 2020-06-19 MED ORDER — POTASSIUM CHLORIDE 10 MEQ/100ML IV SOLN
INTRAVENOUS | Status: AC
  Administered 2020-06-19: 10 meq via INTRAVENOUS
  Filled 2020-06-19: qty 100

## 2020-06-19 MED ORDER — SODIUM CHLORIDE 0.9 % IV SOLN: Freq: Once | INTRAVENOUS | Status: AC

## 2020-06-19 MED ORDER — TRAZODONE HCL 50 MG PO TABS
25.0000 mg | ORAL_TABLET | Freq: Every evening | ORAL | Status: DC | PRN
  Administered 2020-06-22 – 2020-07-03 (×12): 25 mg via ORAL
  Filled 2020-06-19 (×12): qty 1

## 2020-06-19 MED ORDER — LEVETIRACETAM IN NACL 500 MG/100ML IV SOLN
500.0000 mg | Freq: Two times a day (BID) | INTRAVENOUS | Status: DC
  Administered 2020-06-20 – 2020-06-30 (×21): 500 mg via INTRAVENOUS
  Filled 2020-06-19 (×23): qty 100

## 2020-06-19 MED ORDER — LEVETIRACETAM IN NACL 1000 MG/100ML IV SOLN
1000.0000 mg | Freq: Once | INTRAVENOUS | Status: AC
  Administered 2020-06-19: 1000 mg via INTRAVENOUS
  Filled 2020-06-19: qty 100

## 2020-06-19 MED ORDER — LORAZEPAM 2 MG/ML IJ SOLN
INTRAMUSCULAR | Status: AC
  Administered 2020-06-19: 2 mg via INTRAVENOUS
  Filled 2020-06-19: qty 1

## 2020-06-19 MED ORDER — ACETAMINOPHEN 650 MG RE SUPP: 650.0000 mg | Freq: Four times a day (QID) | RECTAL | Status: DC | PRN

## 2020-06-19 NOTE — Progress Notes (Signed)
STAT EEG completed; results pending. Dr Yadav notified. 

## 2020-06-19 NOTE — ED Notes (Signed)
Pt's mouth was suctioned and pt was placed on 4L 02 per [facility] and given IV ativan.

## 2020-06-19 NOTE — Code Documentation (Addendum)
Stroke Response Nurse Documentation Code Documentation  Amy [name] is a 70 y.o. female arriving to [place] ED via [facility] on 06/19/2020 with past medical hx of CKD III. Code stroke was activated by EMS. Patient from home where she was LKW at 1530 and now complaining of right gaze, decreased LOC. Per pt's husband, pt appeared to have a seizure and she fell and hit her head. Stroke team at the bedside on patient arrival. Labs drawn and patient cleared for CT by Dr. Lanny Hurst. Patient to CT with team. NIHSS 6, see documentation for details and code stroke times. Patient with disoriented, bilateral leg weakness and left neglect on exam. The following imaging was completed: CT, CTA head and neck. Pt noted to have a 1.5 minute long tonic seizure while in CT, 2mg  IV Ativan given and supplemental oxygen applied. Patient is not a candidate for tPA.  Care/Plan: VS & NIHSS q2h Bedside handoff with ED RN A. [place]  Rapid Response RN

## 2020-06-19 NOTE — Consult Note (Signed)
Neurology Consultation  Reason for Consult: Right gaze, unresponsive Referring Physician: Dr. Vanita Panda  CC: Right gaze, unresponsive  History is obtained from: EMS, patient husband at bedside  HPI: Amy [name] is a 70 y.o. female with a medical history significant for end stage renal disease on peritoneal dialysis (previously on hemodialysis), hypertension, depression, anxiety, a right hip fracture secondary to a mechanical fall in December of 2021, and a recent hospital admission for gastroenteritis due to COVID-19 infection. Per patient husband, she has been ambulating with a walker at home since her hip fracture and has had labile blood pressures. This morning, Amy [name] had a mechanical fall and hit her head over her left eyebrow on a dresser. Husband states she did not fall hard and landed more on her side with bare minimal contact to her left eyebrow. Later in the day, she was sitting in a chair and was unaware of bowel incontinence. Her husband states he helped her clean up and helped her to the restroom. He estimates that she was in the restroom for 2.5 hours due to diarrhea before getting up. He states he was helping her get from the toilet to the bed when she collapsed from fatigue but did not lose consciousness and was caught by her husband. He sat her in a nearby chair and noted that she was looking sick and called EMS. During EMS assessment, patient was witnessed staring off with a right gaze, unresponsiveness with snoring respirations with some return to baseline on hospital arrival. Patient does not have history of seizures.   On ED arrival, patient was taken to CT as a code stroke for evaluation of left-sided neglect and some confusion. While on the CT table, she was witnessed to have loud vocalization, full body stiffening, initial upward gaze that later became left lateral gaze, left upper extremity rhythmic jerking, quickly followed by right upper extremity rhythmic and resolved after  approximately 90 seconds. She received 2 mg of Ativan with cessation of seizures and had snoring respirations following seizure activity.   Patient's husband states that the only new medication changes that he is aware of is withholding antihypertensive medications at home with hypotension. He states that patient does not drink alcohol or use illicit drugs. He states she has never had a seizure before today.   LKW: 15:30 tpa given?: no, witnessed seizure as etiology of deficits  ROS: Unable to obtain due to altered mental status.   Past Medical History:  Diagnosis Date  . Anemia    a. 08/2016 Guaiac + stool. EGD w/ gastritis.  . Anxiety   . CKD (chronic kidney disease), stage III ([place])   . Closed fracture of shaft of left ulna 11/27/2018  . Colon polyp    a. 08/2016 Colonoscopy.  . Gastritis    a. 08/2016 EGD: Gastritis-->PPI.  [name] GERD (gastroesophageal reflux disease)   . History of chicken pox   . History of measles as a child   . Hypertension   . Hyponatremia    a. 08/2016 in setting of HCTZ Rx.  . Mesenteric lymphadenopathy 04/08/2017  . Multiple thyroid nodules    Family History  Problem Relation Age of Onset  . Pancreatic cancer Father   . Congestive Heart Failure Maternal Grandmother   . Cancer Paternal Grandfather   . Heart disease Maternal Uncle    Social History:   reports that she has never smoked. She has never used smokeless tobacco. She reports current alcohol use. She reports that she does  not use drugs.  Medications  Current Facility-Administered Medications:  .  levETIRAcetam (KEPPRA) IVPB 1000 mg/100 mL premix, 1,000 mg, Intravenous, Once, Toberman, Stevi W, NP .  LORazepam (ATIVAN) 2 MG/ML injection, , , ,  .  sodium chloride flush (NS) 0.9 % injection 3 mL, 3 mL, Intravenous, Once, Carmin Muskrat, MD  Current Outpatient Medications:  .  buPROPion (WELLBUTRIN) 75 MG tablet, TAKE 1 TABLET(75 MG) BY MOUTH TWICE DAILY (Patient taking differently: Take 75 mg  by mouth 2 (two) times daily.), Disp: 180 tablet, Rfl: 3 .  butalbital-acetaminophen-caffeine (FIORICET) 50-325-40 MG tablet, TAKE 1 TO 2 TABLETS BY MOUTH EVERY 4 HOURS AS NEEDED FOR HEADACHE (Patient taking differently: Take 2 tablets by mouth every 6 (six) hours as needed for headache.), Disp: 150 tablet, Rfl: 5 .  calcium acetate (PHOSLO) 667 MG capsule, Take 667 mg by mouth 3 (three) times daily with meals., Disp: , Rfl:  .  carvedilol (COREG) 25 MG tablet, Take 1 tablet (25 mg total) by mouth 2 (two) times daily with a meal., Disp: 180 tablet, Rfl: 4 .  cloNIDine (CATAPRES) 0.2 MG tablet, Take 1 tablet (0.2 mg total) by mouth 2 (two) times daily., Disp: 60 tablet, Rfl: 0 .  esomeprazole (NEXIUM) 20 MG capsule, Take 20 mg by mouth daily., Disp: , Rfl:  .  hydrALAZINE (APRESOLINE) 50 MG tablet, Take 1 tablet (50 mg total) by mouth 3 (three) times daily as needed. For SBP > 160 mm hg, Disp: 90 tablet, Rfl: 0 .  LORazepam (ATIVAN) 1 MG tablet, TAKE 1 TABLET(1 MG) BY MOUTH TWICE DAILY AS NEEDED (Patient taking differently: Take 1 mg by mouth at bedtime.), Disp: 180 tablet, Rfl: 5 .  losartan (COZAAR) 100 MG tablet, Take 100 mg by mouth at bedtime., Disp: , Rfl:  .  sennosides-docusate sodium (SENOKOT-S) 8.6-50 MG tablet, Take 2 tablets by mouth 2 (two) times daily as needed for constipation., Disp: 30 tablet, Rfl: 0 .  sodium bicarbonate 650 MG tablet, Take 1 tablet (650 mg total) by mouth 2 (two) times daily., Disp: 60 tablet, Rfl: 0 .  valACYclovir (VALTREX) 1000 MG tablet, Take 1,000 mg by mouth 2 (two) times daily as needed (cold sores)., Disp: , Rfl:  .  zolpidem (AMBIEN) 10 MG tablet, TAKE 1/2 TO 1 TABLET(5 TO 10 MG) BY MOUTH AT BEDTIME AS NEEDED (Patient taking differently: Take 10 mg by mouth at bedtime.), Disp: 30 tablet, Rfl: 5  Current Outpatient Medications  Medication Instructions  . buPROPion (WELLBUTRIN) 75 MG tablet TAKE 1 TABLET(75 MG) BY MOUTH TWICE DAILY  .  butalbital-acetaminophen-caffeine (FIORICET) 50-325-40 MG tablet TAKE 1 TO 2 TABLETS BY MOUTH EVERY 4 HOURS AS NEEDED FOR HEADACHE  . calcium acetate (PHOSLO) 667 mg, Oral, 3 times daily with meals  . carvedilol (COREG) 25 mg, Oral, 2 times daily with meals  . cloNIDine (CATAPRES) 0.2 mg, Oral, 2 times daily  . esomeprazole (NEXIUM) 20 mg, Oral, Daily  . hydrALAZINE (APRESOLINE) 50 mg, Oral, 3 times daily PRN, For SBP > 160 mm hg  . LORazepam (ATIVAN) 1 MG tablet TAKE 1 TABLET(1 MG) BY MOUTH TWICE DAILY AS NEEDED  . losartan (COZAAR) 100 mg, Oral, Daily at bedtime  . sennosides-docusate sodium (SENOKOT-S) 8.6-50 MG tablet 2 tablets, Oral, 2 times daily PRN  . sodium bicarbonate 650 mg, Oral, 2 times daily  . valACYclovir (VALTREX) 1,000 mg, Oral, 2 times daily PRN  . zolpidem (AMBIEN) 10 MG tablet TAKE 1/2 TO 1 TABLET(5 TO 10  MG) BY MOUTH AT BEDTIME AS NEEDED   Exam: Current vital signs: Wt 51.2 kg   BMI 19.38 kg/m  Vital signs in last 24 hours: Weight:  [51.2 kg] 51.2 kg (02/17 1600)  GENERAL: Drowsy, confused, in no acute distress HEAD: Normocephalic, band-aid with evidence of recent bleeding present over right eyebrow, not actively bleeding EENT: Normal conjunctiva, no OP obstruction LUNGS - Normal respiratory effort. Non-labored breathing CV - Regular rate on cardiac monitor, extremities warm without edema ABDOMEN - Soft, non-distended Ext: warm, without obvious deformity   NEURO:  Mental Status: on initial assessment patient is able to state her name but states she is in her 38s when asked about her age. She states that the month is May. When asked other orientation questions she states "I don't know I am sorry" she is able to state "yes" and "no" to some question appropriately about her history but remains confused. Her speech is hypophonic without dysarthria or aphasia. Naming and repetition are intact. She is unable to give a clear and coherent history of present illness due to  confusion and unresponsiveness/post-ictal state.  Cranial Nerves:  II: PERRL 3 mm/brisk. Visual fields full.  III, IV, VI: EOMI. Lid elevation symmetric and full.  V: Sensation is intact to light touch in the right face, absent in the left VII: Face is symmetric resting and smiling.  VIII: Hearing intact to voice IX, X: Hypophonic speech  XI: Normal sternocleidomastoid and trapezius muscle strength XII: Tongue protrudes midline without fasciculations Motor: Bilateral upper extremities with antigravity movement without pronator drift 5/5. Bilateral lower extremities 4/5 with antigravity movement with minimal drift, feet do not hit bed. Lower extremity strength assessment required some coaching. Tone is normal. Bulk is normal.  Sensation- Intact to light touch in right face, upper, and lower extremities. Patient neglect present on the left side.  Coordination: FTN intact bilaterally. HKS intact bilaterally. Gait- deferred  1a Level of Conscious.: 0 1b LOC Questions: 2 1c LOC Commands: 0 2 Best Gaze: 0 3 Visual: 0 4 Facial Palsy: 0 5a Motor Arm - left: 0 5b Motor Arm - Right: 0 6a Motor Leg - Left: 1 6b Motor Leg - Right: 1 7 Limb Ataxia: 0 8 Sensory: 0 9 Best Language: 0 10 Dysarthria: 0 11 Extinct. and Inatten.: 2, left-sided neglect TOTAL: 6  Labs I have reviewed labs in epic and the results pertinent to this consultation are: CBC    Component Value Date/Time   WBC 16.7 (H) 06/19/2020 1613   RBC 2.92 (L) 06/19/2020 1613   HGB 9.2 (L) 06/19/2020 1617   HGB 9.2 (L) 11/08/2018 1620   HCT 27.0 (L) 06/19/2020 1617   HCT 28.5 (L) 11/08/2018 1620   PLT 443 (H) 06/19/2020 1613   PLT 235 11/08/2018 1620   MCV 87.7 06/19/2020 1613   MCV 88 11/08/2018 1620   MCH 30.8 06/19/2020 1613   MCHC 35.2 06/19/2020 1613   RDW 16.5 (H) 06/19/2020 1613   RDW 16.1 (H) 11/08/2018 1620   LYMPHSABS 1.1 06/19/2020 1613   LYMPHSABS 0.8 11/08/2018 1620   MONOABS 1.0 06/19/2020 1613   EOSABS  0.0 06/19/2020 1613   EOSABS 0.4 11/08/2018 1620   BASOSABS 0.0 06/19/2020 1613   BASOSABS 0.1 11/08/2018 1620  CMP     Component Value Date/Time   NA 122 (L) 06/19/2020 1617   NA 135 11/08/2018 1620   K 2.1 (LL) 06/19/2020 1617   CL 83 (L) 06/19/2020 1617   CO2 15 (L)  06/19/2020 1613   GLUCOSE 150 (H) 06/19/2020 1617   BUN 39 (H) 06/19/2020 1617   BUN 77 (HH) 11/08/2018 1620   CREATININE 4.70 (H) 06/19/2020 1617   CREATININE 1.93 (H) 04/05/2017 1451   CALCIUM 7.3 (L) 06/19/2020 1613   PROT 4.0 (L) 06/19/2020 1613   PROT 5.4 (L) 10/06/2018 1600   ALBUMIN 2.0 (L) 06/19/2020 1613   ALBUMIN 4.1 10/06/2018 1600   AST 38 06/19/2020 1613   ALT PENDING 06/19/2020 1613   ALKPHOS 86 06/19/2020 1613   BILITOT 0.5 06/19/2020 1613   BILITOT <0.2 10/06/2018 1600   GFRNONAA 9 (L) 06/19/2020 1613   GFRAA 7 (L) 12/17/2019 0444  Lipid Panel     Component Value Date/Time   CHOL 165 09/24/2016 0550   CHOL 195 12/11/2015 1002   TRIG 126 09/24/2016 0550   HDL 62 09/24/2016 0550   HDL 88 12/11/2015 1002   CHOLHDL 2.7 09/24/2016 0550   VLDL 25 09/24/2016 0550   LDLCALC 78 09/24/2016 0550   LDLCALC 75 12/11/2015 1002   Imaging I have reviewed the images obtained: CT-scan of the brain IMPRESSION: 1. No acute finding by CT. Atrophy and chronic small-vessel ischemic changes of the cerebral hemispheric white matter and basal ganglia regions. 2. ASPECTS is 10.  CT angiography IMPRESSION: 1. No large or medium vessel occlusion. Widespread intracranial distal vessel atherosclerotic irregularity. 2. Minimal atherosclerotic change at both carotid bifurcations but without stenosis. 3. 30-50% stenosis of the left vertebral artery V4 segment. 4. Enlarged heterogeneous thyroid gland. Largest nodule in the isthmus measures up to 2 cm in diameter. Recommend thyroid US (ref:J Am Coll Radiol. 2015 Feb;12(2): 143-50). Aortic Atherosclerosis (ICD10-I70.0).  STAT EEG pending  Assessment: 70 year old  female with medical history as above who presents with left-sided neglect and confusion. Patient with witnessed GTC seizure while in CT and was given 2 mg of Ativan with cessation of seizure activity in approximately 90 seconds from onset. - No history of seizures, CT without acute intracranial abnormality - Lab abnormalities include: hypokalemia (2.1), leukocytosis (16.7), anemia (Hgb 9.2), hyponatremia (122), acute elevations in creatinine and BUN, and hypoalbuminemia - Code stroke initially activated, low suspicion for stroke at this time due to neurologic deficits explained by right hemisphere origin seizure with post-ictal state, leftward gaze, and post-ictal confusion - MRI brain without contrast pending to further evaluate new onset seizures   Impression: New onset seizures Multiple metabolic abnormalities  Recommendations: - STAT EEG - Keppra load 1,000 mg and maintenance dose of 500 mg BID - Inpatient seizure precautions - MRI brain without contrast for further work-up new onset seizures - Manage metabolic derangements per ED/primary team - Ativan 2 mg PRN for any seizure lasting longer than 5 minutes   Anibal Henderson, AGAC-NP [facility] Pager: [phone]  Attestation:  I saw this patient with the APP on 06/19/20, obtained pertinent aspects of the history, and performed relevant physical and neurological examination as documented. Also, I reviewed the available laboratory data and neuroimages, and other relevant tests/notes/procedures.  Patient gives confused history, but able to answer questions and follow commands. Then, observed GTC seizure lasting about 90 seconds. She received 2 mg ativan and then keppra load.  My examination findings include Initial evaluation included intact CN, full EOM, normal facial movement and sensation. Clear/articulate speech. Disoriented. Bilateral LE drift. UEs OK. Extinction to DSS on the left to tactile stimulation. NIHSS = 6,  as documented.   Impression: Stroke like event with seizure witnessed by EMS and  again in the CT area. Multiple metabolic abnormalities, including low K. No tPA as this is a stroke mimic due to seizures, most likely.  Recommendations: Keppra, EEG, MRI brain. Patient on peritoneal dialysis. Suggest admit to medicine for further evaluation with neurology to follow.   Thank you.  Perfecto Kingdom, MD

## 2020-06-19 NOTE — ED Notes (Signed)
Pt had witnessed seizure while in CT. Pt screamed then her body stiffened up. It lasted approx 1.5 mins

## 2020-06-19 NOTE — ED Notes (Signed)
Pt is post tictal right now

## 2020-06-19 NOTE — H&P (Addendum)
History and Physical    Amy [name] ZOX:[id number] DOB: 10/24/1950 DOA: 06/19/2020  PCP: [name], MD (Confirm with patient/family/NH records and if not entered, this has to be entered at [facility] point of entry) Patient coming from: home  I have personally briefly reviewed patient's old medical records in [place]  Chief Complaint: watery diarrhea, weakness, seizure  HPI: Amy [name] is a 70 y.o. female with medical history significant of ESRD on PD, HTN, multiple thyroid nodules. She had a hip fracture in December s/p ORIF. Recently hospitalized 1/25/-05/29/20 for watery diarrhea, hyponatremia, hypokalemia. She was found to be covid 19 positive but was not treated with no signs of respiratory involvement. She did test positive for C. Diff by PCR and was started on oral Vancomycin with a 9 day Rx at discharge which she was unable to get filled. By her report she was treated with clindamycin. She has continued to have copious watery diarrhea with increased weakness. She was observed to have a very brief episode of rightward gaze with slumped head that lasted approximately 1 min. EMS was activated and transported the patient to [facility] for evaluation.    ED Course: T 97.3  142/84  HR 107  RR 17. Lab revealed Na 122, K 2.1, Cr 4.7, WBC 16.7 with 85/7/6. INR 1.1. CT head w/o acute changes. CTA head and Neck w/o LVO. While in CT she did have a 90 second witnessed seizure. TRH is called to admit patient for continued treatment.  Review of Systems: As per HPI otherwise 10 point review of systems negative.    Past Medical History:  Diagnosis Date  . Anemia    a. 08/2016 Guaiac + stool. EGD w/ gastritis.  . Anxiety   . CKD (chronic kidney disease), stage III ([place])   . Closed fracture of shaft of left ulna 11/27/2018  . Colon polyp    a. 08/2016 Colonoscopy.  . Gastritis    a. 08/2016 EGD: Gastritis-->PPI.  [name] GERD (gastroesophageal reflux disease)   . History of chicken pox   . History  of measles as a child   . Hypertension   . Hyponatremia    a. 08/2016 in setting of HCTZ Rx.  . Mesenteric lymphadenopathy 04/08/2017  . Multiple thyroid nodules     Past Surgical History:  Procedure Laterality Date  . ABDOMINAL HYSTERECTOMY  2003   due to heavy periods and clotting during menses  . BREAST LUMPECTOMY Left 1990  . CHOLECYSTECTOMY  2010  . COLONOSCOPY WITH PROPOFOL N/A 09/07/2016   Procedure: COLONOSCOPY WITH PROPOFOL;  Surgeon: Lucilla Lame, MD;  Location: [facility] ENDOSCOPY;  Service: Endoscopy;  Laterality: N/A;  . DIALYSIS/PERMA CATHETER INSERTION N/A 11/16/2018   Procedure: DIALYSIS/PERMA CATHETER INSERTION;  Surgeon: Algernon Huxley, MD;  Location: [place] CV LAB;  Service: Cardiovascular;  Laterality: N/A;  . DIALYSIS/PERMA CATHETER REMOVAL N/A 08/16/2019   Procedure: DIALYSIS/PERMA CATHETER REMOVAL;  Surgeon: Algernon Huxley, MD;  Location: [place] CV LAB;  Service: Cardiovascular;  Laterality: N/A;  . DILATION AND CURETTAGE OF UTERUS  1985  . ESOPHAGOGASTRODUODENOSCOPY (EGD) WITH PROPOFOL N/A 09/07/2016   Procedure: ESOPHAGOGASTRODUODENOSCOPY (EGD) WITH PROPOFOL;  Surgeon: Lucilla Lame, MD;  Location: [facility] ENDOSCOPY;  Service: Endoscopy;  Laterality: N/A;  . GALLBLADDER SURGERY  2012  . INTRAMEDULLARY (IM) NAIL INTERTROCHANTERIC Right 04/23/2020   Procedure: INTRAMEDULLARY (IM) NAIL INTERTROCHANTRIC;  Surgeon: Thornton Park, MD;  Location: [facility] ORS;  Service: Orthopedics;  Laterality: Right;  . KNEE SURGERY  Dr. Tamala Julian  . MRI, Lumbar spine  07/23/2009   Multilevel annular bulge and disc protrusion at L2-L3, L3-L4, L4-L5, and L5-S1  . TONSILLECTOMY    . TONSILLECTOMY AND ADENOIDECTOMY  1970  . UPPER GI ENDOSCOPY  03/02/2006   H. Pylori negative; Dr. Allen Norris    Soc Hx - married 86 years, 1 daughter, no grandchildren. She lives with her husband who is a dedicated care giver.    reports that she has never smoked. She has never used smokeless tobacco. She reports  current alcohol use. She reports that she does not use drugs.  Allergies  Allergen Reactions  . Cephalosporins Hives and Swelling  . Clarithromycin Other (See Comments)    Unknown reaction  . Lunesta  [Eszopiclone]     Heart racing  . Penicillin V     Has patient had a PCN reaction causing immediate rash, facial/tongue/throat swelling, SOB or lightheadedness with hypotension: Yes Has patient had a PCN reaction causing severe rash involving mucus membranes or skin necrosis: No Has patient had a PCN reaction that required hospitalization No Has patient had a PCN reaction occurring within the last 10 years: No If all of the above answers are "NO", then may proceed with Cephalosporin use.  . Sulfa Antibiotics     Unknown reaction    Family History  Problem Relation Age of Onset  . Pancreatic cancer Father   . Congestive Heart Failure Maternal Grandmother   . Cancer Paternal Grandfather   . Heart disease Maternal Uncle      Prior to Admission medications   Medication Sig Start Date End Date Taking? Authorizing Provider  buPROPion (WELLBUTRIN) 75 MG tablet TAKE 1 TABLET(75 MG) BY MOUTH TWICE DAILY Patient taking differently: Take 75 mg by mouth 2 (two) times daily. 02/11/20   [name], MD  butalbital-acetaminophen-caffeine (FIORICET) 50-325-40 MG tablet TAKE 1 TO 2 TABLETS BY MOUTH EVERY 4 HOURS AS NEEDED FOR HEADACHE Patient taking differently: Take 2 tablets by mouth every 6 (six) hours as needed for headache. 05/25/20   [name], MD  calcium acetate (PHOSLO) 667 MG capsule Take 667 mg by mouth 3 (three) times daily with meals. 04/11/20   [provider]  carvedilol (COREG) 25 MG tablet Take 1 tablet (25 mg total) by mouth 2 (two) times daily with a meal. 03/24/20   [name], MD  cloNIDine (CATAPRES) 0.2 MG tablet Take 1 tablet (0.2 mg total) by mouth 2 (two) times daily. 12/17/19 01/16/20  Max Sane, MD  esomeprazole (NEXIUM) 20 MG capsule Take 20 mg  by mouth daily. 02/20/19   [provider]  hydrALAZINE (APRESOLINE) 50 MG tablet Take 1 tablet (50 mg total) by mouth 3 (three) times daily as needed. For SBP > 160 mm hg 12/17/19 01/16/20  Max Sane, MD  LORazepam (ATIVAN) 1 MG tablet TAKE 1 TABLET(1 MG) BY MOUTH TWICE DAILY AS NEEDED Patient taking differently: Take 1 mg by mouth at bedtime. 03/17/20   [name], MD  losartan (COZAAR) 100 MG tablet Take 100 mg by mouth at bedtime. 02/20/19   [provider]  sennosides-docusate sodium (SENOKOT-S) 8.6-50 MG tablet Take 2 tablets by mouth 2 (two) times daily as needed for constipation. 04/26/20   Max Sane, MD  sodium bicarbonate 650 MG tablet Take 1 tablet (650 mg total) by mouth 2 (two) times daily. 05/31/20   Sharen Hones, MD  valACYclovir (VALTREX) 1000 MG tablet Take 1,000 mg by mouth 2 (two) times daily  as needed (cold sores). 09/11/19   [provider]  zolpidem (AMBIEN) 10 MG tablet TAKE 1/2 TO 1 TABLET(5 TO 10 MG) BY MOUTH AT BEDTIME AS NEEDED Patient taking differently: Take 10 mg by mouth at bedtime. 05/16/20   [name], MD    Physical Exam: Vitals:   06/19/20 1845 06/19/20 1900 06/19/20 1915 06/19/20 1930  BP: 128/81 131/76 133/75 (!) 142/84  Pulse:      Resp: 18 18 11 17   Temp:      TempSrc:      SpO2:    100%  Weight:      Height:         Vitals:   06/19/20 1845 06/19/20 1900 06/19/20 1915 06/19/20 1930  BP: 128/81 131/76 133/75 (!) 142/84  Pulse:      Resp: 18 18 11 17   Temp:      TempSrc:      SpO2:    100%  Weight:      Height:       General: mildly ashen woman in no acute distress, fully awake and cogniscent. Eyes: PERRL, lids and conjunctivae normal ENMT: Mucous membranes are moist. Posterior pharynx clear of any exudate or lesions.Normal dentition.  Neck: normal, supple, no masses, no thyromegaly Respiratory: clear to auscultation bilaterally, no wheezing, no crackles. Normal respiratory effort. No accessory muscle  use.  Cardiovascular: Regular rate and rhythm, no murmurs / rubs / gallops. No extremity edema. 2+ pedal pulses. No carotid bruits.  Abdomen: PD catheter noted, mild tenderness LLQ, no masses palpated. No hepatosplenomegaly. Bowel sounds positive.  Musculoskeletal: no clubbing / cyanosis. No joint deformity upper and lower extremities. Good ROM, no contractures. Normal muscle tone.  Skin: no rashes, lesions, ulcers. No induration Neurologic: CN 2-12 grossly intact. Sensation intact.. Strength 4/5 in all 4.  Psychiatric: Normal judgment and insight. Alert and oriented x 3. Normal mood.     Labs on Admission: I have personally reviewed following labs and imaging studies  CBC: Recent Labs  Lab 06/19/20 1613 06/19/20 1617  WBC 16.7*  --   NEUTROABS 14.4*  --   HGB 9.0* 9.2*  HCT 25.6* 27.0*  MCV 87.7  --   PLT 443*  --    Basic Metabolic Panel: Recent Labs  Lab 06/19/20 1613 06/19/20 1617  NA 123* 122*  K 2.1* 2.1*  CL 86* 83*  CO2 15*  --   GLUCOSE 158* 150*  BUN 36* 39*  CREATININE 5.01* 4.70*  CALCIUM 7.3*  --    GFR: Estimated Creatinine Clearance: 8.9 mL/min (A) (by C-G formula based on SCr of 4.7 mg/dL (H)). Liver Function Tests: Recent Labs  Lab 06/19/20 1613  AST 38  ALT 27  ALKPHOS 86  BILITOT 0.5  PROT 4.0*  ALBUMIN 2.0*   No results for input(s): LIPASE, AMYLASE in the last 168 hours. No results for input(s): AMMONIA in the last 168 hours. Coagulation Profile: Recent Labs  Lab 06/19/20 1613  INR 1.1   Cardiac Enzymes: No results for input(s): CKTOTAL, CKMB, CKMBINDEX, TROPONINI in the last 168 hours. BNP (last 3 results) No results for input(s): PROBNP in the last 8760 hours. HbA1C: No results for input(s): HGBA1C in the last 72 hours. CBG: Recent Labs  Lab 06/19/20 1609  GLUCAP 140*   Lipid Profile: No results for input(s): CHOL, HDL, LDLCALC, TRIG, CHOLHDL, LDLDIRECT in the last 72 hours. Thyroid Function Tests: No results for  input(s): TSH, T4TOTAL, FREET4, T3FREE, THYROIDAB in the last 72  hours. Anemia Panel: No results for input(s): VITAMINB12, FOLATE, FERRITIN, TIBC, IRON, RETICCTPCT in the last 72 hours. Urine analysis:    Component Value Date/Time   COLORURINE STRAW (A) 12/16/2019 1742   APPEARANCEUR HAZY (A) 12/16/2019 1742   LABSPEC 1.010 12/16/2019 1742   PHURINE 5.0 12/16/2019 1742   GLUCOSEU NEGATIVE 12/16/2019 1742   HGBUR NEGATIVE 12/16/2019 1742   BILIRUBINUR NEGATIVE 12/16/2019 1742   BILIRUBINUR negative 08/31/2018 1132   KETONESUR NEGATIVE 12/16/2019 1742   PROTEINUR NEGATIVE 12/16/2019 1742   UROBILINOGEN 0.2 08/31/2018 1132   NITRITE NEGATIVE 12/16/2019 1742   LEUKOCYTESUR TRACE (A) 12/16/2019 1742    Radiological Exams on Admission: EEG adult  Result Date: 06/19/2020 [name], MD     06/19/2020  7:42 PM Patient Name: Amy [name] MRN: [id number] Epilepsy Attending: [name] Referring Physician/Provider: Anibal Henderson, NP Date: 06/19/2020 Duration: Patient history: 70yo F with sudden onset speech disturbance, right gaze deviation and seizure like episode. EEG to evaluate for seizure  Level of alertness: Awake AEDs during EEG study: LEV Technical aspects: This EEG study was done with scalp electrodes positioned according to the 10-20 International system of electrode placement. Electrical activity was acquired at a sampling rate of 500Hz  and reviewed with a high frequency filter of 70Hz  and a low frequency filter of 1Hz . EEG data were recorded continuously and digitally stored. Description: No clear posterior dominant rhythm was seen.  EEG showed continuous generalized and maximal bifrontal 5-9hz  theta-delta activity as well as intermittent 2-3hz  delta slowing. Hyperventilation and photic stimulation were not performed.   ABNORMALITY -Continuous slow, generalized and maximal bifrontal region IMPRESSION: This study is suggestive of cortical dysfunction in bifrontal region which is  non specific but could be seen due to underlying structural abnormality, post-ictal state. There is also mild to moderate diffuse encephalopathy, nonspecific etiology. No seizures or epileptiform discharges were seen throughout the recording. [name]   CT HEAD CODE STROKE WO CONTRAST  Result Date: 06/19/2020 CLINICAL DATA:  Code stroke.  Acute stroke presentation. EXAM: CT HEAD WITHOUT CONTRAST TECHNIQUE: Contiguous axial images were obtained from the base of the skull through the vertex without intravenous contrast. COMPARISON:  12/16/2019 FINDINGS: Brain: Generalized atrophy. No focal infarction seen affecting the brainstem or cerebellum. Cerebral hemispheres show chronic small-vessel ischemic changes of the hemispheric white matter and the basal ganglia regions. No sign of acute infarction, mass lesion, hemorrhage hydrocephalus or extra-axial collection. Vascular: There is atherosclerotic calcification of the major vessels at the base of the brain. Skull: Negative Sinuses/Orbits: Clear/normal Other: None ASPECTS ([place] Stroke Program Early CT Score) - Ganglionic level infarction (caudate, lentiform nuclei, internal capsule, insula, M1-M3 cortex): 7 - Supraganglionic infarction (M4-M6 cortex): 3 Total score (0-10 with 10 being normal): 10 IMPRESSION: 1. No acute finding by CT. Atrophy and chronic small-vessel ischemic changes of the cerebral hemispheric white matter and basal ganglia regions. 2. ASPECTS is 10. 3. These results were communicated to Dr. Charlsie Merles At 4:25 pmon 2/17/2022by text page via the [facility] messaging system. Electronically Signed   By: Nelson Chimes M.D.   On: 06/19/2020 16:25   CT ANGIO HEAD CODE STROKE  Result Date: 06/19/2020 CLINICAL DATA:  Left-sided neglect. Seizure activity. Acute stroke suspected. EXAM: CT ANGIOGRAPHY HEAD AND NECK TECHNIQUE: Multidetector CT imaging of the head and neck was performed using the standard protocol during bolus administration of intravenous  contrast. Multiplanar CT image reconstructions and MIPs were obtained to evaluate the vascular anatomy. Carotid stenosis measurements (when  applicable) are obtained utilizing NASCET criteria, using the distal internal carotid diameter as the denominator. CONTRAST:  56mL OMNIPAQUE IOHEXOL 350 MG/ML SOLN COMPARISON:  Head CT earlier same day FINDINGS: CTA NECK FINDINGS Aortic arch: Mild atherosclerotic change of the aortic arch. Branching pattern is normal without origin stenosis. Right carotid system: Common carotid artery widely patent to the bifurcation. Carotid bifurcation shows minimal plaque but there is no stenosis. Cervical ICA widely patent. Left carotid system: Common carotid artery widely patent to the bifurcation. Mild plaque at the bifurcation and ICA bulb but no stenosis. Cervical ICA widely patent. Vertebral arteries: Large vertebral arteries widely patent at their origins and through the cervical region to the foramen magnum. Skeleton: Mid cervical spondylosis which could be painful. Other neck: No adenopathy. Enlarged heterogeneous thyroid gland. Largest nodule in the isthmus measures up to 2 cm in diameter. Upper chest: Mild pleural and parenchymal scarring at the lung apices. Review of the MIP images confirms the above findings CTA HEAD FINDINGS Anterior circulation: Both internal carotid arteries are patent through the skull base and siphon regions. There is ordinary atherosclerotic calcification in the carotid siphon regions but without stenosis greater than 30% suspected. The anterior and middle cerebral vessels are patent. No large or medium vessel occlusion identified. Distal branch vessels do show atherosclerotic irregularity. Posterior circulation: Both vertebral arteries widely patent through the foramen magnum to the basilar. There is focal plaque of the left vertebral artery V4 segment with stenosis estimated at 30-50%. No vertebral stenosis. Posterior circulation branch vessels are  patent. Distal branch vessels do show atherosclerotic irregularity. Venous sinuses: Patent and normal. Anatomic variants: None significant. Review of the MIP images confirms the above findings IMPRESSION: 1. No large or medium vessel occlusion. Widespread intracranial distal vessel atherosclerotic irregularity. 2. Minimal atherosclerotic change at both carotid bifurcations but without stenosis. 3. 30-50% stenosis of the left vertebral artery V4 segment. 4. Enlarged heterogeneous thyroid gland. Largest nodule in the isthmus measures up to 2 cm in diameter. Recommend thyroid US (ref: J Am Coll Radiol. 2015 Feb;12(2): 143-50). Aortic Atherosclerosis (ICD10-I70.0). Electronically Signed   By: Nelson Chimes M.D.   On: 06/19/2020 17:08   CT ANGIO NECK CODE STROKE  Result Date: 06/19/2020 CLINICAL DATA:  Left-sided neglect. Seizure activity. Acute stroke suspected. EXAM: CT ANGIOGRAPHY HEAD AND NECK TECHNIQUE: Multidetector CT imaging of the head and neck was performed using the standard protocol during bolus administration of intravenous contrast. Multiplanar CT image reconstructions and MIPs were obtained to evaluate the vascular anatomy. Carotid stenosis measurements (when applicable) are obtained utilizing NASCET criteria, using the distal internal carotid diameter as the denominator. CONTRAST:  10mL OMNIPAQUE IOHEXOL 350 MG/ML SOLN COMPARISON:  Head CT earlier same day FINDINGS: CTA NECK FINDINGS Aortic arch: Mild atherosclerotic change of the aortic arch. Branching pattern is normal without origin stenosis. Right carotid system: Common carotid artery widely patent to the bifurcation. Carotid bifurcation shows minimal plaque but there is no stenosis. Cervical ICA widely patent. Left carotid system: Common carotid artery widely patent to the bifurcation. Mild plaque at the bifurcation and ICA bulb but no stenosis. Cervical ICA widely patent. Vertebral arteries: Large vertebral arteries widely patent at their origins  and through the cervical region to the foramen magnum. Skeleton: Mid cervical spondylosis which could be painful. Other neck: No adenopathy. Enlarged heterogeneous thyroid gland. Largest nodule in the isthmus measures up to 2 cm in diameter. Upper chest: Mild pleural and parenchymal scarring at the lung apices. Review of the MIP images  confirms the above findings CTA HEAD FINDINGS Anterior circulation: Both internal carotid arteries are patent through the skull base and siphon regions. There is ordinary atherosclerotic calcification in the carotid siphon regions but without stenosis greater than 30% suspected. The anterior and middle cerebral vessels are patent. No large or medium vessel occlusion identified. Distal branch vessels do show atherosclerotic irregularity. Posterior circulation: Both vertebral arteries widely patent through the foramen magnum to the basilar. There is focal plaque of the left vertebral artery V4 segment with stenosis estimated at 30-50%. No vertebral stenosis. Posterior circulation branch vessels are patent. Distal branch vessels do show atherosclerotic irregularity. Venous sinuses: Patent and normal. Anatomic variants: None significant. Review of the MIP images confirms the above findings IMPRESSION: 1. No large or medium vessel occlusion. Widespread intracranial distal vessel atherosclerotic irregularity. 2. Minimal atherosclerotic change at both carotid bifurcations but without stenosis. 3. 30-50% stenosis of the left vertebral artery V4 segment. 4. Enlarged heterogeneous thyroid gland. Largest nodule in the isthmus measures up to 2 cm in diameter. Recommend thyroid US (ref: J Am Coll Radiol. 2015 Feb;12(2): 143-50). Aortic Atherosclerosis (ICD10-I70.0). Electronically Signed   By: Nelson Chimes M.D.   On: 06/19/2020 17:08    EKG: Independently reviewed. Sinus tachycardia, LAE, no STEMI  Assessment/Plan Active Problems:   C. difficile diarrhea   Anxiety   Fever blister    Essential hypertension   Hypokalemia   Hyponatremia   HLD (hyperlipidemia)   ESRD on dialysis (HCC)   C. difficile colitis   Seizure ([place])    1. Hyponatremia/hypkalemia - GI losses most likely. Fully awake but did have Sz. PLan Na replacement with NS w/ K at 100 cc/hr  F/u Bmet in AM  2. C. Diff diarrhea - prior diagnosis and incomplete treatment. Now with leukocytosis, abdominal tenderness, mucus laden watery diarrhea c/w persistent C. Diff. Plan Repeat C Diff assay  Fidaxomicin for persistent C. Diff  Enteric precautions  3. ESRD on PD - question of safety of PD in setting of C. Diff Plan Consulted Dr. Joelyn Oms, nephrology with this question  4. Seizure - patient with two witnessed events. LIkely 2/2 hyponatremia. CT head - negative, CTA head/neck negative. EEG w/o Sz focus. No evidence to suggest stroke. Plan Correct Sodium  Continue Keppra  DVT prophylaxis: heparin  Code Status: full code  Family Communication: husband present during exam. Understands Dx and Tx plan  Disposition Plan: home when stable  Consults called: nephrology - Dr. Joelyn Oms Admission status: inpatient  Med-surg    Adella Hare MD [facility] Pager [phone]  If 7PM-7AM, please contact night-coverage [email or web address] Password [facility]  06/19/2020, 8:50 PM

## 2020-06-19 NOTE — ED Notes (Signed)
Verbally notified Dr Vanita Panda of pt's critical K level.

## 2020-06-19 NOTE — ED Notes (Signed)
Pt had BM, cleaned pt, applied clean brief

## 2020-06-19 NOTE — ED Notes (Signed)
Pt placed on 6L 02 per [place]. Trying to get pt's Sa02 to pick up again. We have been having trouble to get it to pick up.

## 2020-06-19 NOTE — ED Provider Notes (Signed)
[facility] EMERGENCY DEPARTMENT Provider Note   CSN: [id number] Arrival date & time: 06/19/20  1606  An emergency department physician performed an initial assessment on this suspected stroke patient at 1610.  History Chief Complaint  Patient presents with  . Code Stroke    HARLENE PETRALIA is a 70 y.o. female w/ h/o ESRD on PD nightly, HTN, anxiety, and depression who presents to the ED for generalized weakness, AMS, seizure, and unresponsive. Per spouse, patient develoed copious watery non-bloody diarrhea over the last several days. Recently admitted for viral COVID gastroenteritis several weeks ago. Progressively worsening generalized weakness, resulting in patient suffering mechanical fall earlier today with no reported LOC. Later today, patient sat on commode for approximately 2 hours due to diarrhea. When standing up, patient collapsed into husband's arms without LOC, prompting him to call EMS to house. When EMS arrived, patient had episode with rightward gaze, unresponsiveness, and snoring respirations lasting less than a minute. Patient altered while en route to ED. Upon arrival, no further seizure activity, and patient awake and alert. No fever, headache, chest pain, cough, SOB, or abdominal pain.  The history is provided by the patient, medical records, the spouse and the EMS personnel.  Altered Mental Status Presenting symptoms: disorientation, lethargy and partial responsiveness   Severity:  Moderate Most recent episode:  Today Episode history:  Single Duration:  1 day Timing:  Constant Progression:  Worsening Chronicity:  New Context: recent illness   Associated symptoms: decreased appetite, seizures and weakness   Associated symptoms: no abdominal pain, no fever, no headaches, no nausea, no palpitations, no rash and no vomiting        Past Medical History:  Diagnosis Date  . Anemia    a. 08/2016 Guaiac + stool. EGD w/ gastritis.  . Anxiety   . CKD  (chronic kidney disease), stage III ([place])   . Closed fracture of shaft of left ulna 11/27/2018  . Colon polyp    a. 08/2016 Colonoscopy.  . Gastritis    a. 08/2016 EGD: Gastritis-->PPI.  [name] GERD (gastroesophageal reflux disease)   . History of chicken pox   . History of measles as a child   . Hypertension   . Hyponatremia    a. 08/2016 in setting of HCTZ Rx.  . Mesenteric lymphadenopathy 04/08/2017  . Multiple thyroid nodules     Patient Active Problem List   Diagnosis Date Noted  . C. difficile diarrhea 06/19/2020  . C. difficile colitis 06/19/2020  . Seizure ([place]) 06/19/2020  . Gastroenteritis due to COVID-19 virus 05/29/2020  . Surgery, elective   . S/P ORIF (open reduction internal fixation) fracture   . Closed right hip fracture, initial encounter ([place]) 04/22/2020  . Hypotension 12/16/2019  . ESRD on dialysis ([place]) 12/16/2019  . Depression with anxiety 12/16/2019  . Hypomagnesemia 12/16/2019  . Atrial flutter ([place])   . Atrial fibrillation with RVR ([place]) 04/03/2019  . Benign essential HTN 04/03/2019  . HLD (hyperlipidemia) 04/03/2019  . End stage renal disease ([place]) 12/13/2018  . Itching 11/08/2018  . Hyperparathyroidism, secondary renal ([place]) 09/13/2017  . Ground glass opacity present on imaging of lung 04/08/2017  . Osteoarthritis of knee 12/29/2016  . Normocytic anemia 10/14/2016  . Intractable episodic headache   . Gastritis   . History of adenomatous polyp of colon 09/07/2016  . Iron deficiency anemia 09/05/2016  . Hyponatremia 09/05/2016  . Multiple thyroid nodules 07/04/2015  . Right thyroid nodule 06/27/2015  . Allergic rhinitis, seasonal 12/03/2014  .  Fever blister 12/03/2014  . Herniated lumbar intervertebral disc 12/03/2014  . Hypokalemia 12/03/2014  . Recurrent sinus infections 12/03/2014  . Insomnia 11/05/2014  . Acquired scoliosis 06/26/2014  . Intervertebral disc stenosis of neural canal of lumbar region 06/20/2014  . Lumbar facet arthropathy  05/21/2014  . Degenerative spondylolisthesis 03/12/2014  . OP (osteoporosis) 08/10/2013  . Idiopathic peripheral neuropathy 03/24/2009  . Edema 12/27/2007  . Headache, tension-type 10/25/2006  . Essential hypertension 10/24/2006  . Anxiety 05/03/1998  . Esophageal reflux 05/03/1998    Past Surgical History:  Procedure Laterality Date  . ABDOMINAL HYSTERECTOMY  2003   due to heavy periods and clotting during menses  . BREAST LUMPECTOMY Left 1990  . CHOLECYSTECTOMY  2010  . COLONOSCOPY WITH PROPOFOL N/A 09/07/2016   Procedure: COLONOSCOPY WITH PROPOFOL;  Surgeon: Lucilla Lame, MD;  Location[facility]OSCOPY;  Service: Endoscopy;  Laterality: N/A;  . DIALYSIS/PERMA CATHETER INSERTION N/A 11/16/2018   Procedure: DIALYSIS/PERMA CATHETER INSERTION;  Surgeon: Algernon Huxley, MD;  Location: [facility] CV LAB;  Service: Cardiovascular;  Laterality: N/A;  . DIALYSIS/PERMA CATHETER REMOVAL N/A 08/16/2019   Procedure: DIALYSIS/PERMA CATHETER REMOVAL;  Surgeon: Algernon Huxley, MD;  Location: [facility] CV LAB;  Service: Cardiovascular;  Laterality: N/A;  . DILATION AND CURETTAGE OF UTERUS  1985  . ESOPHAGOGASTRODUODENOSCOPY (EGD) WITH PROPOFOL N/A 09/07/2016   Procedure: ESOPHAGOGASTRODUODENOSCOPY (EGD) WITH PROPOFOL;  Surgeon: Lucilla Lame, MD;  Location: [facility] ENDOSCOPY;  Service: Endoscopy;  Laterality: N/A;  . GALLBLADDER SURGERY  2012  . INTRAMEDULLARY (IM) NAIL INTERTROCHANTERIC Right 04/23/2020   Procedure: INTRAMEDULLARY (IM) NAIL INTERTROCHANTRIC;  Surgeon: Thornton Park, MD;  Location: [facility] ORS;  Service: Orthopedics;  Laterality: Right;  . KNEE SURGERY     Dr. Tamala Julian  . MRI, Lumbar spine  07/23/2009   Multilevel annular bulge and disc protrusion at L2-L3, L3-L4, L4-L5, and L5-S1  . TONSILLECTOMY    . TONSILLECTOMY AND ADENOIDECTOMY  1970  . UPPER GI ENDOSCOPY  03/02/2006   H. Pylori negative; Dr. Allen Norris     OB History    Gravida  1   Para  1   Term      Preterm      AB       Living        SAB      IAB      Ectopic      Multiple      Live Births              Family History  Problem Relation Age of Onset  . Pancreatic cancer Father   . Congestive Heart Failure Maternal Grandmother   . Cancer Paternal Grandfather   . Heart disease Maternal Uncle     Social History   Tobacco Use  . Smoking status: Never Smoker  . Smokeless tobacco: Never Used  Vaping Use  . Vaping Use: Never used  Substance Use Topics  . Alcohol use: Yes    Alcohol/week: 0.0 - 2.0 standard drinks  . Drug use: No    Home Medications Prior to Admission medications   Medication Sig Start Date End Date Taking? Authorizing Provider  buPROPion (WELLBUTRIN) 75 MG tablet TAKE 1 TABLET(75 MG) BY MOUTH TWICE DAILY Patient taking differently: Take 75 mg by mouth 2 (two) times daily. 02/11/20  Yes [name], MD  butalbital-acetaminophen-caffeine (FIORICET) 50-325-40 MG tablet TAKE 1 TO 2 TABLETS BY MOUTH EVERY 4 HOURS AS NEEDED FOR HEADACHE Patient taking differently: Take 2 tablets by mouth  every 6 (six) hours as needed for headache. 05/25/20  Yes [name], MD  calcitRIOL (ROCALTROL) 0.5 MCG capsule Take 0.5 mcg by mouth daily.   Yes [provider]  cinacalcet (SENSIPAR) 30 MG tablet Take 30 mg by mouth daily.   Yes [provider]  cloNIDine (CATAPRES) 0.2 MG tablet Take 1 tablet (0.2 mg total) by mouth 2 (two) times daily. Patient taking differently: Take 0.2 mg by mouth 3 (three) times daily. 12/17/19 01/16/20 Yes Max Sane, MD  esomeprazole (NEXIUM) 20 MG capsule Take 20 mg by mouth daily. 02/20/19  Yes [provider]  hydrALAZINE (APRESOLINE) 50 MG tablet Take 1 tablet (50 mg total) by mouth 3 (three) times daily as needed. For SBP > 160 mm hg 12/17/19 01/16/20 Yes Shah, Vipul, MD  LORazepam (ATIVAN) 1 MG tablet TAKE 1 TABLET(1 MG) BY MOUTH TWICE DAILY AS NEEDED Patient taking differently: Take 1 mg by mouth 2 (two) times daily as needed  for anxiety or sleep. 03/17/20  Yes [name], MD  losartan (COZAAR) 100 MG tablet Take 100 mg by mouth at bedtime. 02/20/19  Yes [provider]  metoprolol tartrate (LOPRESSOR) 50 MG tablet Take 75 mg by mouth 2 (two) times daily.   Yes [provider]  sennosides-docusate sodium (SENOKOT-S) 8.6-50 MG tablet Take 2 tablets by mouth 2 (two) times daily as needed for constipation. 04/26/20  Yes Max Sane, MD  sodium bicarbonate 650 MG tablet Take 1 tablet (650 mg total) by mouth 2 (two) times daily. 05/31/20  Yes Sharen Hones, MD  torsemide (DEMADEX) 100 MG tablet Take 100 mg by mouth 2 (two) times daily.   Yes [provider]  valACYclovir (VALTREX) 1000 MG tablet Take 1,000 mg by mouth 2 (two) times daily as needed (cold sores). 09/11/19  Yes [provider]  zolpidem (AMBIEN) 10 MG tablet TAKE 1/2 TO 1 TABLET(5 TO 10 MG) BY MOUTH AT BEDTIME AS NEEDED Patient taking differently: Take 10 mg by mouth at bedtime. 05/16/20  Yes [name], MD  carvedilol (COREG) 25 MG tablet Take 1 tablet (25 mg total) by mouth 2 (two) times daily with a meal. Patient not taking: No sig reported 03/24/20   [name], MD  metroNIDAZOLE (FLAGYL) 500 MG tablet Take 500 mg by mouth 3 (three) times daily. Patient not taking: Reported on 06/19/2020    [provider]    Allergies    Cephalosporins, Clarithromycin, Lunesta  [eszopiclone], Penicillin v, and Sulfa antibiotics  Review of Systems   Review of Systems  Constitutional: Positive for activity change, appetite change, decreased appetite and fatigue. Negative for chills and fever.  HENT: Negative for ear pain and sore throat.   Eyes: Negative for pain and visual disturbance.  Respiratory: Negative for cough and shortness of breath.   Cardiovascular: Negative for chest pain and palpitations.  Gastrointestinal: Positive for diarrhea. Negative for abdominal pain, nausea and vomiting.  Genitourinary:  Negative for dysuria and hematuria.  Musculoskeletal: Negative for arthralgias and back pain.  Skin: Negative for color change and rash.  Neurological: Positive for seizures and weakness. Negative for syncope and headaches.  All other systems reviewed and are negative.   Physical Exam Updated Vital Signs BP 125/65   Pulse 88   Temp (!) 97.3 F (36.3 C) (Axillary)   Resp 10   Ht 5\' 4"  (1.626 m)   Wt 51.2 kg   SpO2 100%   BMI 19.38 kg/m   Physical Exam  Vitals and nursing note reviewed.  Constitutional:      General: She is awake. She is not in acute distress.    Appearance: Normal appearance. She is well-developed and well-nourished. She is ill-appearing.  HENT:     Head: Normocephalic and atraumatic.     Right Ear: External ear normal.     Left Ear: External ear normal.     Nose: Nose normal.     Mouth/Throat:     Mouth: Mucous membranes are moist.     Pharynx: Oropharynx is clear. No oropharyngeal exudate or posterior oropharyngeal erythema.  Eyes:     General: No scleral icterus.       Right eye: No discharge.        Left eye: No discharge.     Conjunctiva/sclera: Conjunctivae normal.  Cardiovascular:     Rate and Rhythm: Normal rate and regular rhythm.     Pulses: Normal pulses.     Heart sounds: Normal heart sounds. No murmur heard.   Pulmonary:     Effort: Pulmonary effort is normal. No respiratory distress.     Breath sounds: Normal breath sounds. No wheezing, rhonchi or rales.  Abdominal:     General: Abdomen is flat. There is no distension.     Palpations: Abdomen is soft.     Tenderness: There is no abdominal tenderness. There is no guarding or rebound.  Musculoskeletal:        General: No edema.     Cervical back: Neck supple.  Skin:    General: Skin is warm and dry.     Findings: No rash.  Neurological:     Mental Status: She is alert. She is disoriented.     GCS: GCS eye subscore is 4. GCS verbal subscore is 4. GCS motor subscore is 6.      Cranial Nerves: Cranial nerves are intact. No cranial nerve deficit, dysarthria or facial asymmetry.     Sensory: Sensory deficit present.     Motor: Weakness present.     Comments: L-sided hemi-neglect. 4/5 strength to BLE. 5/5 BUE strength.  Psychiatric:        Mood and Affect: Mood and affect normal.        Behavior: Behavior is cooperative.     ED Results / Procedures / Treatments   Labs (all labs ordered are listed, but only abnormal results are displayed) Labs Reviewed  CBC - Abnormal; Notable for the following components:      Result Value   WBC 16.7 (*)    RBC 2.92 (*)    Hemoglobin 9.0 (*)    HCT 25.6 (*)    RDW 16.5 (*)    Platelets 443 (*)    All other components within normal limits  DIFFERENTIAL - Abnormal; Notable for the following components:   Neutro Abs 14.4 (*)    Abs Immature Granulocytes 0.19 (*)    All other components within normal limits  COMPREHENSIVE METABOLIC PANEL - Abnormal; Notable for the following components:   Sodium 123 (*)    Potassium 2.1 (*)    Chloride 86 (*)    CO2 15 (*)    Glucose, Bld 158 (*)    BUN 36 (*)    Creatinine, Ser 5.01 (*)    Calcium 7.3 (*)    Total Protein 4.0 (*)    Albumin 2.0 (*)    GFR, Estimated 9 (*)    Anion gap 22 (*)    All other components within normal limits  I-STAT CHEM 8, ED - Abnormal; Notable for the following components:   Sodium 122 (*)    Potassium 2.1 (*)    Chloride 83 (*)    BUN 39 (*)    Creatinine, Ser 4.70 (*)    Glucose, Bld 150 (*)    Calcium, Ion 0.91 (*)    TCO2 18 (*)    Hemoglobin 9.2 (*)    HCT 27.0 (*)    All other components within normal limits  CBG MONITORING, ED - Abnormal; Notable for the following components:   Glucose-Capillary 140 (*)    All other components within normal limits  PROTIME-INR  APTT  MAGNESIUM  LIPASE, BLOOD  CBC  CREATININE, SERUM  CBC  BASIC METABOLIC PANEL    EKG None  Radiology EEG adult  Result Date: 06/19/2020 [name], MD      06/19/2020  7:42 PM Patient Name: LARNA CAPELLE MRN: [id number] Epilepsy Attending: [name] Referring Physician/Provider: Anibal Henderson, NP Date: 06/19/2020 Duration: Patient history: 70yo F with sudden onset speech disturbance, right gaze deviation and seizure like episode. EEG to evaluate for seizure  Level of alertness: Awake AEDs during EEG study: LEV Technical aspects: This EEG study was done with scalp electrodes positioned according to the 10-20 International system of electrode placement. Electrical activity was acquired at a sampling rate of 500Hz  and reviewed with a high frequency filter of 70Hz  and a low frequency filter of 1Hz . EEG data were recorded continuously and digitally stored. Description: No clear posterior dominant rhythm was seen.  EEG showed continuous generalized and maximal bifrontal 5-9hz  theta-delta activity as well as intermittent 2-3hz  delta slowing. Hyperventilation and photic stimulation were not performed.   ABNORMALITY -Continuous slow, generalized and maximal bifrontal region IMPRESSION: This study is suggestive of cortical dysfunction in bifrontal region which is non specific but could be seen due to underlying structural abnormality, post-ictal state. There is also mild to moderate diffuse encephalopathy, nonspecific etiology. No seizures or epileptiform discharges were seen throughout the recording. [name]   CT HEAD CODE STROKE WO CONTRAST  Result Date: 06/19/2020 CLINICAL DATA:  Code stroke.  Acute stroke presentation. EXAM: CT HEAD WITHOUT CONTRAST TECHNIQUE: Contiguous axial images were obtained from the base of the skull through the vertex without intravenous contrast. COMPARISON:  12/16/2019 FINDINGS: Brain: Generalized atrophy. No focal infarction seen affecting the brainstem or cerebellum. Cerebral hemispheres show chronic small-vessel ischemic changes of the hemispheric white matter and the basal ganglia regions. No sign of acute infarction, mass  lesion, hemorrhage hydrocephalus or extra-axial collection. Vascular: There is atherosclerotic calcification of the major vessels at the base of the brain. Skull: Negative Sinuses/Orbits: Clear/normal Other: None ASPECTS ([place] Stroke Program Early CT Score) - Ganglionic level infarction (caudate, lentiform nuclei, internal capsule, insula, M1-M3 cortex): 7 - Supraganglionic infarction (M4-M6 cortex): 3 Total score (0-10 with 10 being normal): 10 IMPRESSION: 1. No acute finding by CT. Atrophy and chronic small-vessel ischemic changes of the cerebral hemispheric white matter and basal ganglia regions. 2. ASPECTS is 10. 3. These results were communicated to Dr. Charlsie Merles At 4:25 pmon 2/17/2022by text page via the [facility] messaging system. Electronically Signed   By: Nelson Chimes M.D.   On: 06/19/2020 16:25   CT ANGIO HEAD CODE STROKE  Result Date: 06/19/2020 CLINICAL DATA:  Left-sided neglect. Seizure activity. Acute stroke suspected. EXAM: CT ANGIOGRAPHY HEAD AND NECK TECHNIQUE: Multidetector CT imaging of the head and neck was performed using the standard protocol during bolus administration  of intravenous contrast. Multiplanar CT image reconstructions and MIPs were obtained to evaluate the vascular anatomy. Carotid stenosis measurements (when applicable) are obtained utilizing NASCET criteria, using the distal internal carotid diameter as the denominator. CONTRAST:  19mL OMNIPAQUE IOHEXOL 350 MG/ML SOLN COMPARISON:  Head CT earlier same day FINDINGS: CTA NECK FINDINGS Aortic arch: Mild atherosclerotic change of the aortic arch. Branching pattern is normal without origin stenosis. Right carotid system: Common carotid artery widely patent to the bifurcation. Carotid bifurcation shows minimal plaque but there is no stenosis. Cervical ICA widely patent. Left carotid system: Common carotid artery widely patent to the bifurcation. Mild plaque at the bifurcation and ICA bulb but no stenosis. Cervical ICA widely patent.  Vertebral arteries: Large vertebral arteries widely patent at their origins and through the cervical region to the foramen magnum. Skeleton: Mid cervical spondylosis which could be painful. Other neck: No adenopathy. Enlarged heterogeneous thyroid gland. Largest nodule in the isthmus measures up to 2 cm in diameter. Upper chest: Mild pleural and parenchymal scarring at the lung apices. Review of the MIP images confirms the above findings CTA HEAD FINDINGS Anterior circulation: Both internal carotid arteries are patent through the skull base and siphon regions. There is ordinary atherosclerotic calcification in the carotid siphon regions but without stenosis greater than 30% suspected. The anterior and middle cerebral vessels are patent. No large or medium vessel occlusion identified. Distal branch vessels do show atherosclerotic irregularity. Posterior circulation: Both vertebral arteries widely patent through the foramen magnum to the basilar. There is focal plaque of the left vertebral artery V4 segment with stenosis estimated at 30-50%. No vertebral stenosis. Posterior circulation branch vessels are patent. Distal branch vessels do show atherosclerotic irregularity. Venous sinuses: Patent and normal. Anatomic variants: None significant. Review of the MIP images confirms the above findings IMPRESSION: 1. No large or medium vessel occlusion. Widespread intracranial distal vessel atherosclerotic irregularity. 2. Minimal atherosclerotic change at both carotid bifurcations but without stenosis. 3. 30-50% stenosis of the left vertebral artery V4 segment. 4. Enlarged heterogeneous thyroid gland. Largest nodule in the isthmus measures up to 2 cm in diameter. Recommend thyroid US (ref: J Am Coll Radiol. 2015 Feb;12(2): 143-50). Aortic Atherosclerosis (ICD10-I70.0). Electronically Signed   By: Nelson Chimes M.D.   On: 06/19/2020 17:08   CT ANGIO NECK CODE STROKE  Result Date: 06/19/2020 CLINICAL DATA:  Left-sided  neglect. Seizure activity. Acute stroke suspected. EXAM: CT ANGIOGRAPHY HEAD AND NECK TECHNIQUE: Multidetector CT imaging of the head and neck was performed using the standard protocol during bolus administration of intravenous contrast. Multiplanar CT image reconstructions and MIPs were obtained to evaluate the vascular anatomy. Carotid stenosis measurements (when applicable) are obtained utilizing NASCET criteria, using the distal internal carotid diameter as the denominator. CONTRAST:  41mL OMNIPAQUE IOHEXOL 350 MG/ML SOLN COMPARISON:  Head CT earlier same day FINDINGS: CTA NECK FINDINGS Aortic arch: Mild atherosclerotic change of the aortic arch. Branching pattern is normal without origin stenosis. Right carotid system: Common carotid artery widely patent to the bifurcation. Carotid bifurcation shows minimal plaque but there is no stenosis. Cervical ICA widely patent. Left carotid system: Common carotid artery widely patent to the bifurcation. Mild plaque at the bifurcation and ICA bulb but no stenosis. Cervical ICA widely patent. Vertebral arteries: Large vertebral arteries widely patent at their origins and through the cervical region to the foramen magnum. Skeleton: Mid cervical spondylosis which could be painful. Other neck: No adenopathy. Enlarged heterogeneous thyroid gland. Largest nodule in the isthmus measures up to  2 cm in diameter. Upper chest: Mild pleural and parenchymal scarring at the lung apices. Review of the MIP images confirms the above findings CTA HEAD FINDINGS Anterior circulation: Both internal carotid arteries are patent through the skull base and siphon regions. There is ordinary atherosclerotic calcification in the carotid siphon regions but without stenosis greater than 30% suspected. The anterior and middle cerebral vessels are patent. No large or medium vessel occlusion identified. Distal branch vessels do show atherosclerotic irregularity. Posterior circulation: Both vertebral  arteries widely patent through the foramen magnum to the basilar. There is focal plaque of the left vertebral artery V4 segment with stenosis estimated at 30-50%. No vertebral stenosis. Posterior circulation branch vessels are patent. Distal branch vessels do show atherosclerotic irregularity. Venous sinuses: Patent and normal. Anatomic variants: None significant. Review of the MIP images confirms the above findings IMPRESSION: 1. No large or medium vessel occlusion. Widespread intracranial distal vessel atherosclerotic irregularity. 2. Minimal atherosclerotic change at both carotid bifurcations but without stenosis. 3. 30-50% stenosis of the left vertebral artery V4 segment. 4. Enlarged heterogeneous thyroid gland. Largest nodule in the isthmus measures up to 2 cm in diameter. Recommend thyroid US (ref: J Am Coll Radiol. 2015 Feb;12(2): 143-50). Aortic Atherosclerosis (ICD10-I70.0). Electronically Signed   By: Nelson Chimes M.D.   On: 06/19/2020 17:08    Procedures Procedures  Medications Ordered in ED Medications  sodium chloride flush (NS) 0.9 % injection 3 mL (3 mLs Intravenous Not Given 06/19/20 1929)  levETIRAcetam (KEPPRA) IVPB 500 mg/100 mL premix (has no administration in time range)  heparin injection 5,000 Units (5,000 Units Subcutaneous Given 06/19/20 2151)  0.9 % NaCl with KCl 20 mEq/ L  infusion ( Intravenous New Bag/Given 06/19/20 2146)  acetaminophen (TYLENOL) tablet 650 mg (has no administration in time range)    Or  acetaminophen (TYLENOL) suppository 650 mg (has no administration in time range)  traZODone (DESYREL) tablet 25 mg (has no administration in time range)  fidaxomicin (DIFICID) tablet 200 mg (200 mg Oral Given 06/19/20 2150)  iohexol (OMNIPAQUE) 350 MG/ML injection 75 mL (75 mLs Intravenous Contrast Given 06/19/20 1655)  levETIRAcetam (KEPPRA) IVPB 1000 mg/100 mL premix (0 mg Intravenous Stopped 06/19/20 1841)  LORazepam (ATIVAN) injection 2 mg (2 mg Intravenous Given 06/19/20  1642)  sodium chloride 0.9 % bolus 1,000 mL (0 mLs Intravenous Stopped 06/19/20 1929)  potassium chloride 10 mEq in 100 mL IVPB (0 mEq Intravenous Stopped 06/19/20 1929)  0.9 %  sodium chloride infusion ( Intravenous New Bag/Given 06/19/20 1949)  potassium chloride 10 mEq in 100 mL IVPB (0 mEq Intravenous Stopped 06/19/20 2100)    ED Course  I have reviewed the triage vital signs and the nursing notes.  Pertinent labs & imaging results that were available during my care of the patient were reviewed by me and considered in my medical decision making (see chart for details).    MDM Rules/Calculators/A&P                          Patient is a 70yoF with history and physical as described who presents to the ED via EMS as a code stroke. Upon arrival, ABCs intact and HDS. She is awake, alert, and disoriented. Neurology present at time of patient arrival and escorted her back to Center where she has another witnessed seizure that resolved after Ativan. CT code stroke imaging obtained and unremarkable. Suspect presentation likely 2/2 severe hyponatremia and hypokalemia in setting of poor  PO intake and significant GI losses. ECG with sinus tachycardia and prolonged QTc 612 as well as significant artifact. Patient given 1L NaCl bolus as well as IV potassium supplementation. IV Keppra loaded in ED. Neurology recommends admission for further monitoring, EEG, Keppra, and electrolyte repletion. Discussed patient with medicine who will admit. Patient otherwise remained HDS with no further acute events during ED course.  Final Clinical Impression(s) / ED Diagnoses Final diagnoses:  Seizure ([place])  Hyponatremia  Hypokalemia  Diarrhea, unspecified type    Rx / DC Orders ED Discharge Orders    None       Christy Gentles, MD 06/20/20 Ninfa Meeker    Carmin Muskrat, MD 06/27/20 1504

## 2020-06-19 NOTE — Procedures (Signed)
Patient Name: Amy [name]  MRN: [id number]  Epilepsy Attending: [name]  Referring Physician/Provider: Anibal Henderson, NP Date: 06/19/2020 Duration:   Patient history: 70yo F with sudden onset speech disturbance, right gaze deviation and seizure like episode. EEG to evaluate for seizure    Level of alertness: Awake  AEDs during EEG study: LEV  Technical aspects: This EEG study was done with scalp electrodes positioned according to the 10-20 International system of electrode placement. Electrical activity was acquired at a sampling rate of 500Hz  and reviewed with a high frequency filter of 70Hz  and a low frequency filter of 1Hz . EEG data were recorded continuously and digitally stored.   Description: No clear posterior dominant rhythm was seen.  EEG showed continuous generalized and maximal bifrontal 5-9hz  theta-delta activity as well as intermittent 2-3hz  delta slowing. Hyperventilation and photic stimulation were not performed.     ABNORMALITY -Continuous slow, generalized and maximal bifrontal region  IMPRESSION: This study is suggestive of cortical dysfunction in bifrontal region which is non specific but could be seen due to underlying structural abnormality, post-ictal state. There is also mild to moderate diffuse encephalopathy, nonspecific etiology. No seizures or epileptiform discharges were seen throughout the recording.  [name] Barbra Sarks

## 2020-06-19 NOTE — ED Triage Notes (Signed)
Pt arrived via [facility] as a Code Stroke. Per EMS pt's husband told them pt had a seizure, fel and hit head and became unresponsive. Per EMS when they arrived pt was having snoring respirations. LKW 1530

## 2020-06-19 NOTE — ED Notes (Signed)
Placed pt on NRB 15 L

## 2020-06-20 ENCOUNTER — Encounter (HOSPITAL_COMMUNITY): Payer: Self-pay | Admitting: *Deleted

## 2020-06-20 ENCOUNTER — Inpatient Hospital Stay (HOSPITAL_COMMUNITY): Payer: Medicare Other

## 2020-06-20 DIAGNOSIS — R569 Unspecified convulsions: Secondary | ICD-10-CM | POA: Diagnosis not present

## 2020-06-20 DIAGNOSIS — E871 Hypo-osmolality and hyponatremia: Secondary | ICD-10-CM | POA: Diagnosis not present

## 2020-06-20 LAB — RETICULOCYTES
Immature Retic Fract: 18.5 % — ABNORMAL HIGH (ref 2.3–15.9)
RBC.: 2.41 MIL/uL — ABNORMAL LOW (ref 3.87–5.11)
Retic Count, Absolute: 48.4 10*3/uL (ref 19.0–186.0)
Retic Ct Pct: 2 % (ref 0.4–3.1)

## 2020-06-20 LAB — BASIC METABOLIC PANEL
Anion gap: 15 (ref 5–15)
Anion gap: 15 (ref 5–15)
BUN: 35 mg/dL — ABNORMAL HIGH (ref 8–23)
BUN: 34 mg/dL — ABNORMAL HIGH (ref 8–23)
CO2: 17 mmol/L — ABNORMAL LOW (ref 22–32)
CO2: 17 mmol/L — ABNORMAL LOW (ref 22–32)
Calcium: 6.9 mg/dL — ABNORMAL LOW (ref 8.9–10.3)
Calcium: 7.3 mg/dL — ABNORMAL LOW (ref 8.9–10.3)
Chloride: 92 mmol/L — ABNORMAL LOW (ref 98–111)
Chloride: 93 mmol/L — ABNORMAL LOW (ref 98–111)
Creatinine, Ser: 4.6 mg/dL — ABNORMAL HIGH (ref 0.44–1.00)
Creatinine, Ser: 4.74 mg/dL — ABNORMAL HIGH (ref 0.44–1.00)
GFR, Estimated: 10 mL/min — ABNORMAL LOW (ref >60)
GFR, Estimated: 9 mL/min — ABNORMAL LOW (ref >60)
Glucose, Bld: 78 mg/dL (ref 70–99)
Glucose, Bld: 78 mg/dL (ref 70–99)
Potassium: 2.1 mmol/L — CL (ref 3.5–5.1)
Potassium: 2.7 mmol/L — CL (ref 3.5–5.1)
Sodium: 124 mmol/L — ABNORMAL LOW (ref 135–145)
Sodium: 125 mmol/L — ABNORMAL LOW (ref 135–145)

## 2020-06-20 LAB — FERRITIN: Ferritin: 1131 ng/mL — ABNORMAL HIGH (ref 11–307)

## 2020-06-20 LAB — LIPASE, BLOOD: Lipase: 95 U/L — ABNORMAL HIGH (ref 11–51)

## 2020-06-20 LAB — CBC
HCT: 22.9 % — ABNORMAL LOW (ref 36.0–46.0)
Hemoglobin: 8 g/dL — ABNORMAL LOW (ref 12.0–15.0)
MCH: 30.1 pg (ref 26.0–34.0)
MCHC: 34.9 g/dL (ref 30.0–36.0)
MCV: 86.1 fL (ref 80.0–100.0)
Platelets: 345 10*3/uL (ref 150–400)
RBC: 2.66 MIL/uL — ABNORMAL LOW (ref 3.87–5.11)
RDW: 16.7 % — ABNORMAL HIGH (ref 11.5–15.5)
WBC: 13.9 10*3/uL — ABNORMAL HIGH (ref 4.0–10.5)
nRBC: 0 % (ref 0.0–0.2)

## 2020-06-20 LAB — IRON AND TIBC
Iron: 102 ug/dL (ref 28–170)
Saturation Ratios: 91 % — ABNORMAL HIGH (ref 10.4–31.8)
TIBC: 112 ug/dL — ABNORMAL LOW (ref 250–450)
UIBC: 10 ug/dL

## 2020-06-20 LAB — MAGNESIUM: Magnesium: 1.4 mg/dL — ABNORMAL LOW (ref 1.7–2.4)

## 2020-06-20 LAB — FOLATE: Folate: 7.3 ng/mL (ref >5.9)

## 2020-06-20 LAB — VITAMIN B12: Vitamin B-12: 1427 pg/mL — ABNORMAL HIGH (ref 180–914)

## 2020-06-20 MED ORDER — POTASSIUM CHLORIDE 10 MEQ/100ML IV SOLN
10.0000 meq | INTRAVENOUS | Status: AC
Start: 2020-06-20 — End: 2020-06-20
  Administered 2020-06-20 (×3): 10 meq via INTRAVENOUS
  Filled 2020-06-20 (×3): qty 100

## 2020-06-20 MED ORDER — POTASSIUM CHLORIDE 2 MEQ/ML IV SOLN: INTRAVENOUS | Status: DC

## 2020-06-20 MED ORDER — CINACALCET HCL 30 MG PO TABS
30.0000 mg | ORAL_TABLET | Freq: Every day | ORAL | Status: DC
  Administered 2020-06-21 – 2020-07-04 (×13): 30 mg via ORAL
  Filled 2020-06-20 (×16): qty 1

## 2020-06-20 MED ORDER — SODIUM CHLORIDE 0.9 % IV SOLN: INTRAVENOUS | Status: DC

## 2020-06-20 MED ORDER — POTASSIUM CHLORIDE IN NACL 40-0.9 MEQ/L-% IV SOLN
INTRAVENOUS | Status: AC
  Filled 2020-06-20: qty 1000

## 2020-06-20 MED ORDER — POTASSIUM CHLORIDE 10 MEQ/100ML IV SOLN: 10.0000 meq | INTRAVENOUS | Status: DC

## 2020-06-20 MED ORDER — POTASSIUM CHLORIDE CRYS ER 20 MEQ PO TBCR
40.0000 meq | EXTENDED_RELEASE_TABLET | ORAL | Status: AC
  Filled 2020-06-20: qty 2

## 2020-06-20 MED ORDER — MAGNESIUM SULFATE IN D5W 1-5 GM/100ML-% IV SOLN
1.0000 g | Freq: Once | INTRAVENOUS | Status: AC
  Administered 2020-06-20: 1 g via INTRAVENOUS
  Filled 2020-06-20: qty 100

## 2020-06-20 MED ORDER — POTASSIUM CHLORIDE 10 MEQ/100ML IV SOLN
10.0000 meq | INTRAVENOUS | Status: AC
  Administered 2020-06-20 (×6): 10 meq via INTRAVENOUS
  Filled 2020-06-20 (×6): qty 100

## 2020-06-20 MED ORDER — CALCITRIOL 0.25 MCG PO CAPS
0.5000 ug | ORAL_CAPSULE | Freq: Every day | ORAL | Status: DC
  Administered 2020-06-21 – 2020-07-01 (×10): 0.5 ug via ORAL
  Filled 2020-06-20: qty 1
  Filled 2020-06-20 (×11): qty 2

## 2020-06-20 MED ORDER — MAGNESIUM SULFATE 2 GM/50ML IV SOLN
2.0000 g | Freq: Once | INTRAVENOUS | Status: AC
  Administered 2020-06-20: 2 g via INTRAVENOUS
  Filled 2020-06-20: qty 50

## 2020-06-20 MED ORDER — POTASSIUM CHLORIDE CRYS ER 20 MEQ PO TBCR: 40.0000 meq | EXTENDED_RELEASE_TABLET | Freq: Once | ORAL | Status: DC

## 2020-06-20 MED ORDER — CHLORHEXIDINE GLUCONATE CLOTH 2 % EX PADS
6.0000 | MEDICATED_PAD | Freq: Every day | CUTANEOUS | Status: DC
  Administered 2020-06-21 – 2020-07-04 (×10): 6 via TOPICAL

## 2020-06-20 NOTE — Progress Notes (Signed)
PROGRESS NOTE                                                                                                                                                                                                             Patient Demographics:    Amy [name], is a 70 y.o. female, DOB - October 02, 1950, DJM:[id number]  Outpatient Primary MD for the patient is Fisher, Kirstie Peri, MD    LOS - 1  Admit date - 06/19/2020    Chief Complaint  Patient presents with  . Code Stroke       Brief Narrative (HPI from H&P)  Amy [name] is a 70 y.o. female with medical history significant of ESRD on PD, HTN, multiple thyroid nodules. She had a hip fracture in December s/p ORIF. Recently hospitalized 1/25/-05/29/20 for watery diarrhea, hyponatremia, hypokalemia. She was found to be covid 19 positive but was not treated with no signs of respiratory involvement. She did test positive for C. Diff by PCR and was started on oral Vancomycin with a 9 day Rx at discharge which she was unable to get filled. By her report she was treated with clindamycin. She has continued to have copious watery diarrhea with increased weakness. She was observed to have a very brief episode of rightward gaze with slumped head that lasted approximately 1 min. EMS was activated and transported the patient to [facility] for evaluation.  Her work-up was consistent with severe dehydration with severe hypokalemia, hypomagnesemia, possible seizures and she was admitted.    Subjective:    Amy [name] today has, No headache, No chest pain, No abdominal pain - No Nausea, No new weakness tingling or numbness, no shortness of breath, improving diarrhea.   Assessment  & Plan :     1. Seizure with postictal state caused by severe electrolyte abnormalities including hyponatremia, hypomagnesemia, hypokalemia caused by C. difficile diarrhea - MRI negative, EEG shows postictal state, seen by neurology  and nephrology.  Electrolytes being replaced, hydrated with IV fluids, on IV Keppra and currently seems to be stabilizing.  PT OT to monitor.  2.  C. difficile infection present on admission.  Being treated with Dificid as she seems to have failed 10-day course of vancomycin.  3.  Recent COVID-19 incidental infection.  Supportive care.  4.  ESRD.  On PD.  Currently on hold  renal following.  5.  Anemia of chronic disease.  Will monitor, type screen will be done as with heme dilution expected to get worse.  No signs of ongoing bleeding.  We will also check anemia panel.          Condition - Extremely Guarded  Family Communication  : Wayna Chalet [phone]  on 06/20/2020 at 11:56 AM message left  Code Status : Full  Consults  :  Neuro, Renal  PUD Prophylaxis : None   Procedures  :     MRI Brain - Non acute  EEG - Post ictial state      Disposition Plan  :    Status is: Inpatient  Remains inpatient appropriate because:IV treatments appropriate due to intensity of illness or inability to take PO   Dispo: The patient is from: Home              Anticipated d/c is to: Home              Anticipated d/c date is: 3 days              Patient currently is not medically stable to d/c.   Difficult to place patient No   DVT Prophylaxis  :   Heparin   Lab Results  Component Value Date   PLT 345 06/20/2020    Diet :  Diet Order            Diet renal with fluid restriction Fluid restriction: 1200 mL Fluid; Room service appropriate? Yes; Fluid consistency: Thin  Diet effective now                  Inpatient Medications  Scheduled Meds: . calcitRIOL  0.5 mcg Oral Daily  . [START ON 06/21/2020] cinacalcet  30 mg Oral Q breakfast  . fidaxomicin  200 mg Oral BID  . heparin  5,000 Units Subcutaneous Q8H  . potassium chloride  40 mEq Oral Q4H  . sodium chloride flush  3 mL Intravenous Once   Continuous Infusions: . 0.9 % NaCl with KCl 40 mEq / L 100 mL/hr at  06/20/20 0803  . levETIRAcetam Stopped (06/20/20 9826)  . potassium chloride 10 mEq (06/20/20 1119)   PRN Meds:.acetaminophen **OR** acetaminophen, traZODone  Antibiotics  :    Anti-infectives (From admission, onward)   Start     Dose/Rate Route Frequency Ordered Stop   06/19/20 2200  fidaxomicin (DIFICID) tablet 200 mg        200 mg Oral 2 times daily 06/19/20 2005 06/29/20 2159       Time Spent in minutes  30   Lala Lund M.D on 06/20/2020 at 11:53 AM  To page go to [email or web address]   [facility] -  Office  [phone]    See all Orders from today for further details    Objective:   Vitals:   06/20/20 1015 06/20/20 1030 06/20/20 1045 06/20/20 1100  BP: 136/72 (!) 142/75 139/66 127/71  Pulse: 91 97 95 (!) 108  Resp:  19 18 13   Temp:      TempSrc:      SpO2: 100% 100% 100% 100%  Weight:      Height:        Wt Readings from Last 3 Encounters:  06/19/20 51.2 kg  05/31/20 50.8 kg  04/26/20 53.5 kg     Intake/Output Summary (Last 24 hours) at 06/20/2020 1153 Last data filed at 06/20/2020 0929 Gross per 24 hour  Intake 2113.24 ml  Output -  Net 2113.24 ml     Physical Exam  Awake Alert, No new F.N deficits, Normal affect [place].AT,PERRAL Supple Neck,No JVD, No cervical lymphadenopathy appriciated.  Symmetrical Chest wall movement, Good air movement bilaterally, CTAB RRR,No Gallops,Rubs or new Murmurs, No Parasternal Heave +ve B.Sounds, Abd Soft, No tenderness, No organomegaly appriciated, No rebound - guarding or rigidity.  PD catheter in place site appears stable. No Cyanosis, Clubbing or edema, No new Rash or bruise      Data Review:    CBC Recent Labs  Lab 06/19/20 1613 06/19/20 1617 06/20/20 0313  WBC 16.7*  --  13.9*  HGB 9.0* 9.2* 8.0*  HCT 25.6* 27.0* 22.9*  PLT 443*  --  345  MCV 87.7  --  86.1  MCH 30.8  --  30.1  MCHC 35.2  --  34.9  RDW 16.5*  --  16.7*  LYMPHSABS 1.1  --   --   MONOABS 1.0  --   --   EOSABS 0.0  --   --    BASOSABS 0.0  --   --     Recent Labs  Lab 06/19/20 1613 06/19/20 1617 06/20/20 0313  NA 123* 122* 124*  K 2.1* 2.1* 2.1*  CL 86* 83* 92*  CO2 15*  --  17*  GLUCOSE 158* 150* 78  BUN 36* 39* 35*  CREATININE 5.01* 4.70* 4.60*  CALCIUM 7.3*  --  6.9*  AST 38  --   --   ALT 27  --   --   ALKPHOS 86  --   --   BILITOT 0.5  --   --   ALBUMIN 2.0*  --   --   MG  --   --  1.4*  INR 1.1  --   --     ------------------------------------------------------------------------------------------------------------------ No results for input(s): CHOL, HDL, LDLCALC, TRIG, CHOLHDL, LDLDIRECT in the last 72 hours.  No results found for: HGBA1C ------------------------------------------------------------------------------------------------------------------ No results for input(s): TSH, T4TOTAL, T3FREE, THYROIDAB in the last 72 hours.  Invalid input(s): FREET3  Cardiac Enzymes No results for input(s): CKMB, TROPONINI, MYOGLOBIN in the last 168 hours.  Invalid input(s): CK ------------------------------------------------------------------------------------------------------------------    Component Value Date/Time   BNP 536.0 (H) 04/03/2019 1255    Micro Results No results found for this or any previous visit (from the past 240 hour(s)).  Radiology Reports MR BRAIN WO CONTRAST  Result Date: 06/20/2020 CLINICAL DATA:  Left-sided neglect and confusion.  Seizure activity. EXAM: MRI HEAD WITHOUT CONTRAST TECHNIQUE: Multiplanar, multiecho pulse sequences of the brain and surrounding structures were obtained without intravenous contrast. COMPARISON:  Head CT 06/19/2020 FINDINGS: Brain: There is no evidence of an acute infarct, intracranial hemorrhage, mass, midline shift, or extra-axial fluid collection. T2 hyperintensities in the cerebral white matter bilaterally are nonspecific but compatible with mild chronic small vessel ischemic disease. Chronic lacunar infarcts are noted in the right  caudate nucleus and posterior right corona radiata. A chronic lacunar infarct is also noted in the right pons. There is moderate generalized cerebral atrophy. Vascular: Major intracranial vascular flow voids are preserved. Skull and upper cervical spine: Unremarkable bone marrow signal. Moderate to severe disc degeneration at C4-5 and C5-6 and severe left facet arthrosis at C4-5 with grade 1 anterolisthesis. Sinuses/Orbits: Mild bilateral ethmoid air cell mucosal thickening. Clear mastoid air cells. Unremarkable orbits. Other: None. IMPRESSION: 1. No acute intracranial abnormality. 2. Chronic small-vessel ischemia with multiple chronic lacunar infarcts. Electronically Signed   By: Seymour Bars.D.  On: 06/20/2020 10:09   DG Chest Portable 1 View  Result Date: 05/29/2020 CLINICAL DATA:  Weakness EXAM: PORTABLE CHEST 1 VIEW COMPARISON:  12/16/2019 and prior. FINDINGS: No pneumothorax or pleural effusion. Cardiomediastinal silhouette within normal limits. Hazy appearance of the peripheral lung fields. No acute osseous abnormality. IMPRESSION: Hazy appearance of the peripheral lung fields may be artifactual however cannot exclude atypical infection. Electronically Signed   By: Primitivo Gauze M.D.   On: 05/29/2020 16:41   EEG adult  Result Date: 06/19/2020 [name], MD     06/19/2020  7:42 PM Patient Name: CHANDLAR STAEBELL MRN: [id number] Epilepsy Attending: [name] Referring Physician/Provider: Anibal Henderson, NP Date: 06/19/2020 Duration: Patient history: 70yo F with sudden onset speech disturbance, right gaze deviation and seizure like episode. EEG to evaluate for seizure  Level of alertness: Awake AEDs during EEG study: LEV Technical aspects: This EEG study was done with scalp electrodes positioned according to the 10-20 International system of electrode placement. Electrical activity was acquired at a sampling rate of 500Hz  and reviewed with a high frequency filter of 70Hz  and a low  frequency filter of 1Hz . EEG data were recorded continuously and digitally stored. Description: No clear posterior dominant rhythm was seen.  EEG showed continuous generalized and maximal bifrontal 5-9hz  theta-delta activity as well as intermittent 2-3hz  delta slowing. Hyperventilation and photic stimulation were not performed.   ABNORMALITY -Continuous slow, generalized and maximal bifrontal region IMPRESSION: This study is suggestive of cortical dysfunction in bifrontal region which is non specific but could be seen due to underlying structural abnormality, post-ictal state. There is also mild to moderate diffuse encephalopathy, nonspecific etiology. No seizures or epileptiform discharges were seen throughout the recording. [name]   CT HEAD CODE STROKE WO CONTRAST  Result Date: 06/19/2020 CLINICAL DATA:  Code stroke.  Acute stroke presentation. EXAM: CT HEAD WITHOUT CONTRAST TECHNIQUE: Contiguous axial images were obtained from the base of the skull through the vertex without intravenous contrast. COMPARISON:  12/16/2019 FINDINGS: Brain: Generalized atrophy. No focal infarction seen affecting the brainstem or cerebellum. Cerebral hemispheres show chronic small-vessel ischemic changes of the hemispheric white matter and the basal ganglia regions. No sign of acute infarction, mass lesion, hemorrhage hydrocephalus or extra-axial collection. Vascular: There is atherosclerotic calcification of the major vessels at the base of the brain. Skull: Negative Sinuses/Orbits: Clear/normal Other: None ASPECTS ([place] Stroke Program Early CT Score) - Ganglionic level infarction (caudate, lentiform nuclei, internal capsule, insula, M1-M3 cortex): 7 - Supraganglionic infarction (M4-M6 cortex): 3 Total score (0-10 with 10 being normal): 10 IMPRESSION: 1. No acute finding by CT. Atrophy and chronic small-vessel ischemic changes of the cerebral hemispheric white matter and basal ganglia regions. 2. ASPECTS is 10. 3.  These results were communicated to Dr. Charlsie Merles At 4:25 pmon 2/17/2022by text page via the [facility] messaging system. Electronically Signed   By: Nelson Chimes M.D.   On: 06/19/2020 16:25   CT ANGIO HEAD CODE STROKE  Result Date: 06/19/2020 CLINICAL DATA:  Left-sided neglect. Seizure activity. Acute stroke suspected. EXAM: CT ANGIOGRAPHY HEAD AND NECK TECHNIQUE: Multidetector CT imaging of the head and neck was performed using the standard protocol during bolus administration of intravenous contrast. Multiplanar CT image reconstructions and MIPs were obtained to evaluate the vascular anatomy. Carotid stenosis measurements (when applicable) are obtained utilizing NASCET criteria, using the distal internal carotid diameter as the denominator. CONTRAST:  69mL OMNIPAQUE IOHEXOL 350 MG/ML SOLN COMPARISON:  Head CT earlier same day FINDINGS: CTA  NECK FINDINGS Aortic arch: Mild atherosclerotic change of the aortic arch. Branching pattern is normal without origin stenosis. Right carotid system: Common carotid artery widely patent to the bifurcation. Carotid bifurcation shows minimal plaque but there is no stenosis. Cervical ICA widely patent. Left carotid system: Common carotid artery widely patent to the bifurcation. Mild plaque at the bifurcation and ICA bulb but no stenosis. Cervical ICA widely patent. Vertebral arteries: Large vertebral arteries widely patent at their origins and through the cervical region to the foramen magnum. Skeleton: Mid cervical spondylosis which could be painful. Other neck: No adenopathy. Enlarged heterogeneous thyroid gland. Largest nodule in the isthmus measures up to 2 cm in diameter. Upper chest: Mild pleural and parenchymal scarring at the lung apices. Review of the MIP images confirms the above findings CTA HEAD FINDINGS Anterior circulation: Both internal carotid arteries are patent through the skull base and siphon regions. There is ordinary atherosclerotic calcification in the carotid  siphon regions but without stenosis greater than 30% suspected. The anterior and middle cerebral vessels are patent. No large or medium vessel occlusion identified. Distal branch vessels do show atherosclerotic irregularity. Posterior circulation: Both vertebral arteries widely patent through the foramen magnum to the basilar. There is focal plaque of the left vertebral artery V4 segment with stenosis estimated at 30-50%. No vertebral stenosis. Posterior circulation branch vessels are patent. Distal branch vessels do show atherosclerotic irregularity. Venous sinuses: Patent and normal. Anatomic variants: None significant. Review of the MIP images confirms the above findings IMPRESSION: 1. No large or medium vessel occlusion. Widespread intracranial distal vessel atherosclerotic irregularity. 2. Minimal atherosclerotic change at both carotid bifurcations but without stenosis. 3. 30-50% stenosis of the left vertebral artery V4 segment. 4. Enlarged heterogeneous thyroid gland. Largest nodule in the isthmus measures up to 2 cm in diameter. Recommend thyroid US (ref: J Am Coll Radiol. 2015 Feb;12(2): 143-50). Aortic Atherosclerosis (ICD10-I70.0). Electronically Signed   By: Nelson Chimes M.D.   On: 06/19/2020 17:08   CT ANGIO NECK CODE STROKE  Result Date: 06/19/2020 CLINICAL DATA:  Left-sided neglect. Seizure activity. Acute stroke suspected. EXAM: CT ANGIOGRAPHY HEAD AND NECK TECHNIQUE: Multidetector CT imaging of the head and neck was performed using the standard protocol during bolus administration of intravenous contrast. Multiplanar CT image reconstructions and MIPs were obtained to evaluate the vascular anatomy. Carotid stenosis measurements (when applicable) are obtained utilizing NASCET criteria, using the distal internal carotid diameter as the denominator. CONTRAST:  25mL OMNIPAQUE IOHEXOL 350 MG/ML SOLN COMPARISON:  Head CT earlier same day FINDINGS: CTA NECK FINDINGS Aortic arch: Mild atherosclerotic  change of the aortic arch. Branching pattern is normal without origin stenosis. Right carotid system: Common carotid artery widely patent to the bifurcation. Carotid bifurcation shows minimal plaque but there is no stenosis. Cervical ICA widely patent. Left carotid system: Common carotid artery widely patent to the bifurcation. Mild plaque at the bifurcation and ICA bulb but no stenosis. Cervical ICA widely patent. Vertebral arteries: Large vertebral arteries widely patent at their origins and through the cervical region to the foramen magnum. Skeleton: Mid cervical spondylosis which could be painful. Other neck: No adenopathy. Enlarged heterogeneous thyroid gland. Largest nodule in the isthmus measures up to 2 cm in diameter. Upper chest: Mild pleural and parenchymal scarring at the lung apices. Review of the MIP images confirms the above findings CTA HEAD FINDINGS Anterior circulation: Both internal carotid arteries are patent through the skull base and siphon regions. There is ordinary atherosclerotic calcification in the carotid siphon regions  but without stenosis greater than 30% suspected. The anterior and middle cerebral vessels are patent. No large or medium vessel occlusion identified. Distal branch vessels do show atherosclerotic irregularity. Posterior circulation: Both vertebral arteries widely patent through the foramen magnum to the basilar. There is focal plaque of the left vertebral artery V4 segment with stenosis estimated at 30-50%. No vertebral stenosis. Posterior circulation branch vessels are patent. Distal branch vessels do show atherosclerotic irregularity. Venous sinuses: Patent and normal. Anatomic variants: None significant. Review of the MIP images confirms the above findings IMPRESSION: 1. No large or medium vessel occlusion. Widespread intracranial distal vessel atherosclerotic irregularity. 2. Minimal atherosclerotic change at both carotid bifurcations but without stenosis. 3. 30-50%  stenosis of the left vertebral artery V4 segment. 4. Enlarged heterogeneous thyroid gland. Largest nodule in the isthmus measures up to 2 cm in diameter. Recommend thyroid US (ref: J Am Coll Radiol. 2015 Feb;12(2): 143-50). Aortic Atherosclerosis (ICD10-I70.0). Electronically Signed   By: Nelson Chimes M.D.   On: 06/19/2020 17:08

## 2020-06-20 NOTE — ED Notes (Signed)
Patient transported to MRI 

## 2020-06-20 NOTE — Progress Notes (Signed)
Pt transferred to 2C06 in stable condition.

## 2020-06-20 NOTE — Plan of Care (Signed)
  Problem: Clinical Measurements: Goal: Ability to maintain clinical measurements within normal limits will improve Outcome: Progressing Goal: Respiratory complications will improve Outcome: Progressing Goal: Cardiovascular complication will be avoided Outcome: Progressing   Problem: Nutrition: Goal: Adequate nutrition will be maintained Outcome: Progressing   Problem: Coping: Goal: Level of anxiety will decrease Outcome: Progressing   Problem: Pain Managment: Goal: General experience of comfort will improve Outcome: Progressing   Problem: Safety: Goal: Ability to remain free from injury will improve Outcome: Progressing

## 2020-06-20 NOTE — ED Notes (Addendum)
Critical potassium 2.1 Amy Hilding, MD

## 2020-06-20 NOTE — Progress Notes (Signed)
Neurology Progress Note  S: Feels very weak, generally. First seizure she ever had. No further seizure activity. Was told by medicine that she has Cdiff. She failed out patient tx with Vancomycin and is now on Defcid.   O: Current vital signs: BP 127/71   Pulse (!) 108   Temp (!) 97.3 F (36.3 C) (Axillary)   Resp 13   Ht 5\' 4"  (1.626 m)   Wt 51.2 kg   SpO2 100%   BMI 19.38 kg/m  Vital signs in last 24 hours: Temp:  [97.3 F (36.3 C)] 97.3 F (36.3 C) (02/17 1711) Pulse Rate:  [83-118] 108 (02/18 1100) Resp:  [9-21] 13 (02/18 1100) BP: (109-142)/(57-99) 127/71 (02/18 1100) SpO2:  [76 %-100 %] 100 % (02/18 1100) Weight:  [51.2 kg] 51.2 kg (02/17 1713)  GENERAL: Awake, alert in NAD. Appears fatigued.  HEENT: Normocephalic and atraumatic, dry mm LUNGS: Normal respiratory effort.  CV: RRR on tele.  Ext: warm  NEURO:  Mental Status: alert and oriented to all except day and date. She can not remember events from yesterday.  Speech/Language: speech is without dysarthria.  Naming, repetition, fluency, and comprehension intact.  Cranial Nerves:  II: PERRL. Visual fields full. Tracks NP.  III, IV, VI: EOMI. Eyelids elevate symmetrically.  V: Sensation is intact to light touch and symmetrical to face.  VII: Smile is symmetrical.  VIII: hearing intact to voice. IX, X: Palate elevates symmetrically. Phonation is normal.  BT:DVVOHYWV shrug 5/5. XII: tongue is midline without fasciculations. Motor: 4/5 strength to all muscle groups tested.  Tone: is normal and bulk is normal Sensation- Intact to light touch bilaterally. Extinction is normal to DSS.    Coordination: FTN intact bilaterally. NP did not ask patient to perform HKS due to recent hip ORIF. No drift. She does display a right hand tremor, but this is only present when reaching.  Gait- deferred  Medications  Current Facility-Administered Medications:  .  0.9 % NaCl with KCl 40 mEq / L  infusion, , Intravenous,  Continuous, Thurnell Lose, MD, Last Rate: 100 mL/hr at 06/20/20 0803, New Bag at 06/20/20 0803 .  acetaminophen (TYLENOL) tablet 650 mg, 650 mg, Oral, Q6H PRN **OR** acetaminophen (TYLENOL) suppository 650 mg, 650 mg, Rectal, Q6H PRN, Norins, Heinz Knuckles, MD .  calcitRIOL (ROCALTROL) capsule 0.5 mcg, 0.5 mcg, Oral, Daily, Reesa Chew, MD .  Derrill Memo ON 06/21/2020] cinacalcet (SENSIPAR) tablet 30 mg, 30 mg, Oral, Q breakfast, Peeples, Shaune Pollack, MD .  fidaxomicin (DIFICID) tablet 200 mg, 200 mg, Oral, BID, Norins, Heinz Knuckles, MD, 200 mg at 06/19/20 2150 .  heparin injection 5,000 Units, 5,000 Units, Subcutaneous, Q8H, Norins, Heinz Knuckles, MD, 5,000 Units at 06/20/20 0610 .  levETIRAcetam (KEPPRA) IVPB 500 mg/100 mL premix, 500 mg, Intravenous, Q12H, Dearis Danis, Minette Brine, MD, Stopped at 06/20/20 [phone] .  potassium chloride 10 mEq in 100 mL IVPB, 10 mEq, Intravenous, Q1 Hr x 4, Reesa Chew, MD, Last Rate: 100 mL/hr at 06/20/20 1119, 10 mEq at 06/20/20 1119 .  potassium chloride SA (KLOR-CON) CR tablet 40 mEq, 40 mEq, Oral, Q4H, Singh, Prashant K, MD .  sodium chloride flush (NS) 0.9 % injection 3 mL, 3 mL, Intravenous, Once, Carmin Muskrat, MD .  traZODone (DESYREL) tablet 25 mg, 25 mg, Oral, QHS PRN, Norins, Heinz Knuckles, MD  Current Outpatient Medications:  .  buPROPion (WELLBUTRIN) 75 MG tablet, TAKE 1 TABLET(75 MG) BY MOUTH TWICE DAILY (Patient taking differently: Take 75 mg by mouth  2 (two) times daily.), Disp: 180 tablet, Rfl: 3 .  butalbital-acetaminophen-caffeine (FIORICET) 50-325-40 MG tablet, TAKE 1 TO 2 TABLETS BY MOUTH EVERY 4 HOURS AS NEEDED FOR HEADACHE (Patient taking differently: Take 2 tablets by mouth every 6 (six) hours as needed for headache.), Disp: 150 tablet, Rfl: 5 .  calcitRIOL (ROCALTROL) 0.5 MCG capsule, Take 0.5 mcg by mouth daily., Disp: , Rfl:  .  cinacalcet (SENSIPAR) 30 MG tablet, Take 30 mg by mouth daily., Disp: , Rfl:  .  cloNIDine (CATAPRES) 0.2 MG tablet, Take 1  tablet (0.2 mg total) by mouth 2 (two) times daily. (Patient taking differently: Take 0.2 mg by mouth 3 (three) times daily.), Disp: 60 tablet, Rfl: 0 .  esomeprazole (NEXIUM) 20 MG capsule, Take 20 mg by mouth daily., Disp: , Rfl:  .  hydrALAZINE (APRESOLINE) 50 MG tablet, Take 1 tablet (50 mg total) by mouth 3 (three) times daily as needed. For SBP > 160 mm hg, Disp: 90 tablet, Rfl: 0 .  LORazepam (ATIVAN) 1 MG tablet, TAKE 1 TABLET(1 MG) BY MOUTH TWICE DAILY AS NEEDED (Patient taking differently: Take 1 mg by mouth 2 (two) times daily as needed for anxiety or sleep.), Disp: 180 tablet, Rfl: 5 .  losartan (COZAAR) 100 MG tablet, Take 100 mg by mouth at bedtime., Disp: , Rfl:  .  metoprolol tartrate (LOPRESSOR) 50 MG tablet, Take 75 mg by mouth 2 (two) times daily., Disp: , Rfl:  .  sennosides-docusate sodium (SENOKOT-S) 8.6-50 MG tablet, Take 2 tablets by mouth 2 (two) times daily as needed for constipation., Disp: 30 tablet, Rfl: 0 .  sodium bicarbonate 650 MG tablet, Take 1 tablet (650 mg total) by mouth 2 (two) times daily., Disp: 60 tablet, Rfl: 0 .  torsemide (DEMADEX) 100 MG tablet, Take 100 mg by mouth 2 (two) times daily., Disp: , Rfl:  .  valACYclovir (VALTREX) 1000 MG tablet, Take 1,000 mg by mouth 2 (two) times daily as needed (cold sores)., Disp: , Rfl:  .  zolpidem (AMBIEN) 10 MG tablet, TAKE 1/2 TO 1 TABLET(5 TO 10 MG) BY MOUTH AT BEDTIME AS NEEDED (Patient taking differently: Take 10 mg by mouth at bedtime.), Disp: 30 tablet, Rfl: 5 .  carvedilol (COREG) 25 MG tablet, Take 1 tablet (25 mg total) by mouth 2 (two) times daily with a meal. (Patient not taking: No sig reported), Disp: 180 tablet, Rfl: 4 .  metroNIDAZOLE (FLAGYL) 500 MG tablet, Take 500 mg by mouth 3 (three) times daily. (Patient not taking: Reported on 06/19/2020), Disp: , Rfl:   Pertinent Labs Na ++ 124    K+ 2.1    Mg 1.4    [facility] 13.9    Imaging MD has reviewed images in epic and the results pertinent to this  consultation are:  CT Head 1. No acute finding by CT. Atrophy and chronic small-vessel ischemic changes of the cerebral hemispheric white matter and basal ganglia regions. 2. ASPECTS is 10.  CTA head and neck 1. No large or medium vessel occlusion. Widespread intracranial distal vessel atherosclerotic irregularity. 2. Minimal atherosclerotic change at both carotid bifurcations but without stenosis. 3. 30-50% stenosis of the left vertebral artery V4 segment. 4. Enlarged heterogeneous thyroid gland. Largest nodule in the isthmus measures up to 2 cm in diameter. Recommend thyroid US (ref: J Am Coll Radiol. 2015 Feb;12(2): 143-50). Aortic Atherosclerosis (ICD10-I70.0).  MRI Brain  1. No acute intracranial abnormality. 2. Chronic small-vessel ischemia with multiple chronic lacunar infarcts.  Assessment: 70 yo  female who presented as a code stroke but was noted to have fecal incontinence at home with right gaze and unresponsiveness. She also had generalized seizure in CT scanner. She was given 2mg  Ativan with resolved seizure activity in 90 seconds. She was also found to have multiple metabolic derangements which could have provoked her seizures. She does have old lacunar infarcts which could also have contributed to seizures, although, metabolic derangements are more likely the predisposing factor. She was loaded with Keppra 1gm IV and placed on maintenance dose of 500mg  q12 hrs. She has not required Ativan after initial presentation. No further seizure like activity noted. No new stroke seen on [facility] or CTA head and neck.   -EEG showed cortical dysfunction in bifrontal region which is non specific but could be seen due to underlying structural abnormality and post ictal state plus mild to moderate diffuse encephalopathy. No seizures or epileptiform discharges were seen. MRI brain shows no acute abnormality but chronic small vessel ischemia with multiple chronic lacunar infarcts.   Plan 1.  Given gaze preference and unresponsiveness as well as a witnessed seizure in CT, will continue maintenance Keppra 500mg  q12 hrs.  2. Patient should f/up with out patient neurology for adjustment or weaning of AED.  3. Continue to correct metabolic derangements that appear to be secondary to Cdiff. This is likely the provoking abnormality of seizure.   3. Doubt her multiple old lacunar infarcts are responsible for seizure, however, this could be a contributing factor, although less likely.  4. Generalized weakness and right hand intentional tremor-likely from her Cdiff and sickness at home and generalized deconditioning.   Per [facility] statutes, patients with seizures are not allowed to drive until they have been seizure-free for six months.  Use caution when using heavy equipment or power tools. Avoid working on ladders or at heights. Take showers instead of baths. Ensure the water temperature is not too high on the home water heater. Do not go swimming alone. Do not lock yourself in a room alone (i.e. bathroom). When caring for infants or small children, sit down when holding, feeding, or changing them to minimize risk of injury to the child in the event you have a seizure. Maintain good sleep hygiene. Avoid alcohol.   If patient has another seizure, call 911 and bring them back to the ED if: A. The seizure lasts longer than 5 minutes.  B. The patient doesn't wake shortly after the seizure or has new problems such as difficulty seeing, speaking or moving following the seizure C. The patient was injured during the seizure D. The patient has a temperature over 102 F (39C) E. The patient vomited during the seizure and now is having trouble breathing  All were discussed with patient and husband.   Pt seen by Clance Boll, MSN, APN-BC/Nurse Practitioner/Neuro and later by MD. Note and plan to be edited as needed by MD.  Pager: [phone]  Attestation:  I saw this patient  with the APP on 06/20/20, obtained pertinent aspects of the history, and performed relevant physical and neurological examination as documented. Also, I reviewed the available laboratory data and neuroimages, and other relevant tests/notes/procedures.  Ms. Frese remains somewhat confused. She had no acute changes on brain MRI, only old chronic lacunar infarctions. No further seizures.   My examination findings include normal cranial nerves, normal motor exam with exception of subtle fast-frequency tremor, strength good as is sensation. No further sign of extinction to DSS.  Impression: New onset seizure.  No acute abnormality on brain imaging. Electrolyte abnormality and dehydration from C-diff as the likely contributing factors to her seizures.  Recommendations: Continue keppra po 500 mg bid for short-term, and have her FU as outpatient with neurology. Correct dehydration and metabolic derangements. Will be available as needed should further questions arise.  Thank you.  Perfecto Kingdom, MD

## 2020-06-20 NOTE — Consult Note (Addendum)
ESRD Consult Note  Requesting provider: Lala Lund Service requesting consult: Hospitalist Reason for consult: ESRD, provision of dialysis Indication for acute dialysis?: End Stage Renal Disease  Outpatient dialysis unit: [place], PD  Assessment/Recommendations: Amy [name] is a/an 70 y.o. female with a past medical history notable for ESRD on PD admitted with multiple electrolyte derangements, diarrhea, C. difficile, possible seizure.   # ESRD: We will obtain outpatient PD prescription.  Because of hypokalemia will hold off on PD until this has been corrected. # Volume/ hypertension: Volume status and blood pressure acceptable at this time.  Hold home blood pressure medications # Anemia of Chronic Kidney Disease: Hemoglobin 8.  Likely multifactorial with CKD and infection contributing.  Continue to monitor.  Transfuse for hemoglobin less than 7 # Secondary Hyperparathyroidism/Hyperphosphatemia: Continue calcitriol 0.5 MCG daily and Sensipar 30 mg daily. #Hyponatremia: Sodium 124.  Most likely volume depletion.  Continue normal saline infusion #Hypokalemia: Potassium significantly low at 2.1.  Aggressive oral and IV repletion at this time.  Monitor closely today every 6 hours #Acidosis: Mild metabolic acidosis likely related to diarrhea.  Avoid significant correction at this time as this could cause worsening hypokalemia #Possible seizure: Receiving Keppra.  Work-up per primary neurology  # Additional recommendations: - Dose all meds for creatinine clearance < 10 ml/min  - Unless absolutely necessary, no MRIs with gadolinium.  - Implement save arm precautions.  Prefer needle sticks in the dorsum of the hands or wrists.  No blood pressure measurements in arm. - If blood transfusion is requested during hemodialysis sessions, please alert [place] prior to the session.   Recommendations were discussed with the primary team.   History of Present Illness: Amy [name] is a/an 70  y.o. female with a past medical history of ESRD on PD who presents with diarrhea, weakness, seizures.  Patient states she does not remember the details of coming into the hospital.  Much of the history was obtained per chart review.  The patient presented to the hospital yesterday.  She had recently had a hip fracture in December and was hospitalized with watery diarrhea, hyponatremia, hypokalemia.  She was found to be Covid positive during that time and also had C. difficile.  She was started on oral vancomycin at that time but was unable to get the medication filled outpatient..  She has continued to have significant watery diarrhea and increasing weakness.  She had a brief possible seizure-like episode yesterday and was brought to the emergency department.  Labs on arrival demonstrated sodium of 122 and a potassium of 2.1.  She states that she last received dialysis 2 nights ago.  She does PD at home and has been on it for 2 years.  No complications that she knows of.  She has had some mild chills, nausea, vomiting, diarrhea, fatigue.  No shortness of breath or chest pain.  Medications:  Current Facility-Administered Medications  Medication Dose Route Frequency Provider Last Rate Last Admin  . 0.9 % NaCl with KCl 40 mEq / L  infusion   Intravenous Continuous Thurnell Lose, MD 100 mL/hr at 06/20/20 0803 New Bag at 06/20/20 0803  . acetaminophen (TYLENOL) tablet 650 mg  650 mg Oral Q6H PRN Norins, Heinz Knuckles, MD       Or  . acetaminophen (TYLENOL) suppository 650 mg  650 mg Rectal Q6H PRN Norins, Heinz Knuckles, MD      . fidaxomicin (DIFICID) tablet 200 mg  200 mg Oral BID Norins, Heinz Knuckles, MD  200 mg at 06/19/20 2150  . heparin injection 5,000 Units  5,000 Units Subcutaneous Q8H Norins, Heinz Knuckles, MD   5,000 Units at 06/20/20 [phone]  . levETIRAcetam (KEPPRA) IVPB 500 mg/100 mL premix  500 mg Intravenous Q12H Absher, Minette Brine, MD   Stopped at 06/20/20 [phone]  . magnesium sulfate IVPB 2 g 50 mL  2 g  Intravenous Once Thurnell Lose, MD 50 mL/hr at 06/20/20 0829 2 g at 06/20/20 0829  . potassium chloride 10 mEq in 100 mL IVPB  10 mEq Intravenous Q1 Hr x 4 Reesa Chew, MD 100 mL/hr at 06/20/20 0830 10 mEq at 06/20/20 0830  . potassium chloride SA (KLOR-CON) CR tablet 40 mEq  40 mEq Oral Q4H Lala Lund K, MD      . sodium chloride flush (NS) 0.9 % injection 3 mL  3 mL Intravenous Once Carmin Muskrat, MD      . traZODone (DESYREL) tablet 25 mg  25 mg Oral QHS PRN Norins, Heinz Knuckles, MD       Current Outpatient Medications  Medication Sig Dispense Refill  . buPROPion (WELLBUTRIN) 75 MG tablet TAKE 1 TABLET(75 MG) BY MOUTH TWICE DAILY (Patient taking differently: Take 75 mg by mouth 2 (two) times daily.) 180 tablet 3  . butalbital-acetaminophen-caffeine (FIORICET) 50-325-40 MG tablet TAKE 1 TO 2 TABLETS BY MOUTH EVERY 4 HOURS AS NEEDED FOR HEADACHE (Patient taking differently: Take 2 tablets by mouth every 6 (six) hours as needed for headache.) 150 tablet 5  . calcitRIOL (ROCALTROL) 0.5 MCG capsule Take 0.5 mcg by mouth daily.    . cinacalcet (SENSIPAR) 30 MG tablet Take 30 mg by mouth daily.    . cloNIDine (CATAPRES) 0.2 MG tablet Take 1 tablet (0.2 mg total) by mouth 2 (two) times daily. (Patient taking differently: Take 0.2 mg by mouth 3 (three) times daily.) 60 tablet 0  . esomeprazole (NEXIUM) 20 MG capsule Take 20 mg by mouth daily.    . hydrALAZINE (APRESOLINE) 50 MG tablet Take 1 tablet (50 mg total) by mouth 3 (three) times daily as needed. For SBP > 160 mm hg 90 tablet 0  . LORazepam (ATIVAN) 1 MG tablet TAKE 1 TABLET(1 MG) BY MOUTH TWICE DAILY AS NEEDED (Patient taking differently: Take 1 mg by mouth 2 (two) times daily as needed for anxiety or sleep.) 180 tablet 5  . losartan (COZAAR) 100 MG tablet Take 100 mg by mouth at bedtime.    . metoprolol tartrate (LOPRESSOR) 50 MG tablet Take 75 mg by mouth 2 (two) times daily.    . sennosides-docusate sodium (SENOKOT-S) 8.6-50 MG  tablet Take 2 tablets by mouth 2 (two) times daily as needed for constipation. 30 tablet 0  . sodium bicarbonate 650 MG tablet Take 1 tablet (650 mg total) by mouth 2 (two) times daily. 60 tablet 0  . torsemide (DEMADEX) 100 MG tablet Take 100 mg by mouth 2 (two) times daily.    . valACYclovir (VALTREX) 1000 MG tablet Take 1,000 mg by mouth 2 (two) times daily as needed (cold sores).    . zolpidem (AMBIEN) 10 MG tablet TAKE 1/2 TO 1 TABLET(5 TO 10 MG) BY MOUTH AT BEDTIME AS NEEDED (Patient taking differently: Take 10 mg by mouth at bedtime.) 30 tablet 5  . carvedilol (COREG) 25 MG tablet Take 1 tablet (25 mg total) by mouth 2 (two) times daily with a meal. (Patient not taking: No sig reported) 180 tablet 4  . metroNIDAZOLE (FLAGYL) 500 MG tablet  Take 500 mg by mouth 3 (three) times daily. (Patient not taking: Reported on 06/19/2020)       ALLERGIES Cephalosporins, Clarithromycin, Lunesta  [eszopiclone], Penicillin v, and Sulfa antibiotics  MEDICAL HISTORY Past Medical History:  Diagnosis Date  . Anemia    a. 08/2016 Guaiac + stool. EGD w/ gastritis.  . Anxiety   . CKD (chronic kidney disease), stage III ( [place])   . Closed fracture of shaft of left ulna 11/27/2018  . Colon polyp    a. 08/2016 Colonoscopy.  . Gastritis    a. 08/2016 EGD: Gastritis-->PPI.  [name] GERD (gastroesophageal reflux disease)   . History of chicken pox   . History of measles as a child   . Hypertension   . Hyponatremia    a. 08/2016 in setting of HCTZ Rx.  . Mesenteric lymphadenopathy 04/08/2017  . Multiple thyroid nodules      SOCIAL HISTORY Social History   Socioeconomic History  . Marital status: Married    Spouse name: Not on file  . Number of children: 1  . Years of education: Not on file  . Highest education level: Bachelor's degree (e.g., BA, AB, BS)  Occupational History  . Occupation: Retired  Tobacco Use  . Smoking status: Never Smoker  . Smokeless tobacco: Never Used  Vaping Use  . Vaping Use:  Never used  Substance and Sexual Activity  . Alcohol use: Yes    Alcohol/week: 0.0 - 2.0 standard drinks  . Drug use: No  . Sexual activity: Yes  Other Topics Concern  . Not on file  Social History Narrative   Lives in [place] with husband.  Does not routinely exercise.   Social Determinants of Health   Financial Resource Strain: Low Risk   . Difficulty of Paying Living Expenses: Not hard at all  Food Insecurity: No Food Insecurity  . Worried About Charity fundraiser in the Last Year: Never true  . Ran Out of Food in the Last Year: Never true  Transportation Needs: No Transportation Needs  . Lack of Transportation (Medical): No  . Lack of Transportation (Non-Medical): No  Physical Activity: Inactive  . Days of Exercise per Week: 0 days  . Minutes of Exercise per Session: 0 min  Stress: No Stress Concern Present  . Feeling of Stress : Not at all  Social Connections: Moderately Integrated  . Frequency of Communication with Friends and Family: More than three times a week  . Frequency of Social Gatherings with Friends and Family: Three times a week  . Attends Religious Services: More than 4 times per year  . Active Member of Clubs or Organizations: No  . Attends Archivist Meetings: Never  . Marital Status: Married  Human resources officer Violence: Not At Risk  . Fear of Current or Ex-Partner: No  . Emotionally Abused: No  . Physically Abused: No  . Sexually Abused: No     FAMILY HISTORY Family History  Problem Relation Age of Onset  . Pancreatic cancer Father   . Congestive Heart Failure Maternal Grandmother   . Cancer Paternal Grandfather   . Heart disease Maternal Uncle      Review of Systems: 12 systems were reviewed and negative except per HPI  Physical Exam: Vitals:   06/20/20 0745 06/20/20 0800  BP: 131/68 121/73  Pulse: (!) 118 (!) 104  Resp: 20 18  Temp:    SpO2: 100% 100%   Total I/O In: 100 [IV Piggyback:100] Out: -   Intake/Output  Summary (Last 24 hours) at 06/20/2020 0926 Last data filed at 06/20/2020 7915 Gross per 24 hour  Intake 1295.02 ml  Output --  Net 1295.02 ml   General: Ill-appearing, lying in bed, no distress HEENT: anicteric sclera, MMM CV: Tachycardia, no edema Lungs: bilateral chest rise, normal wob Abd: soft, mild distention, minimal pain with palpation Skin: no visible lesions or rashes Psych: alert, engaged, appropriate mood and affect Neuro: normal speech, no gross focal deficits   Test Results Reviewed Lab Results  Component Value Date   NA 124 (L) 06/20/2020   K 2.1 (LL) 06/20/2020   CL 92 (L) 06/20/2020   CO2 17 (L) 06/20/2020   BUN 35 (H) 06/20/2020   CREATININE 4.60 (H) 06/20/2020   GLU 103 04/30/2014   CALCIUM 6.9 (L) 06/20/2020   ALBUMIN 2.0 (L) 06/19/2020   PHOS 6.1 (H) 11/08/2018    I have reviewed relevant outside healthcare records

## 2020-06-20 NOTE — ED Notes (Signed)
Pt remains in MRI 

## 2020-06-20 NOTE — Progress Notes (Signed)
Date and time results received: 06/20/20 7:29 PM  (use smartphrase ".now" to insert current time)  Test: K 2.7 Critical Value: K 2.7  Name of Provider Notified: None- K improving  Orders Received? Or Actions Taken?: none

## 2020-06-20 NOTE — ED Notes (Signed)
Breakfast Ordered 

## 2020-06-21 DIAGNOSIS — E871 Hypo-osmolality and hyponatremia: Secondary | ICD-10-CM | POA: Diagnosis not present

## 2020-06-21 DIAGNOSIS — L8990 Pressure ulcer of unspecified site, unspecified stage: Secondary | ICD-10-CM | POA: Insufficient documentation

## 2020-06-21 LAB — CBC WITH DIFFERENTIAL/PLATELET
Abs Immature Granulocytes: 0.1 10*3/uL — ABNORMAL HIGH (ref 0.00–0.07)
Basophils Absolute: 0 10*3/uL (ref 0.0–0.1)
Basophils Relative: 0 %
Eosinophils Absolute: 0.2 10*3/uL (ref 0.0–0.5)
Eosinophils Relative: 2 %
HCT: 21.3 % — ABNORMAL LOW (ref 36.0–46.0)
Hemoglobin: 7.4 g/dL — ABNORMAL LOW (ref 12.0–15.0)
Immature Granulocytes: 1 %
Lymphocytes Relative: 10 %
Lymphs Abs: 1.1 10*3/uL (ref 0.7–4.0)
MCH: 30.7 pg (ref 26.0–34.0)
MCHC: 34.7 g/dL (ref 30.0–36.0)
MCV: 88.4 fL (ref 80.0–100.0)
Monocytes Absolute: 0.7 10*3/uL (ref 0.1–1.0)
Monocytes Relative: 6 %
Neutro Abs: 9.4 10*3/uL — ABNORMAL HIGH (ref 1.7–7.7)
Neutrophils Relative %: 81 %
Platelets: 348 10*3/uL (ref 150–400)
RBC: 2.41 MIL/uL — ABNORMAL LOW (ref 3.87–5.11)
RDW: 18.2 % — ABNORMAL HIGH (ref 11.5–15.5)
WBC: 11.5 10*3/uL — ABNORMAL HIGH (ref 4.0–10.5)
nRBC: 0 % (ref 0.0–0.2)

## 2020-06-21 LAB — COMPREHENSIVE METABOLIC PANEL
ALT: 20 U/L (ref 0–44)
ALT: 20 U/L (ref 0–44)
AST: 36 U/L (ref 15–41)
AST: 35 U/L (ref 15–41)
Albumin: 1.8 g/dL — ABNORMAL LOW (ref 3.5–5.0)
Albumin: 1.8 g/dL — ABNORMAL LOW (ref 3.5–5.0)
Alkaline Phosphatase: 93 U/L (ref 38–126)
Alkaline Phosphatase: 84 U/L (ref 38–126)
Anion gap: 15 (ref 5–15)
Anion gap: 15 (ref 5–15)
BUN: 33 mg/dL — ABNORMAL HIGH (ref 8–23)
BUN: 33 mg/dL — ABNORMAL HIGH (ref 8–23)
CO2: 16 mmol/L — ABNORMAL LOW (ref 22–32)
CO2: 15 mmol/L — ABNORMAL LOW (ref 22–32)
Calcium: 7.6 mg/dL — ABNORMAL LOW (ref 8.9–10.3)
Calcium: 7.7 mg/dL — ABNORMAL LOW (ref 8.9–10.3)
Chloride: 96 mmol/L — ABNORMAL LOW (ref 98–111)
Chloride: 97 mmol/L — ABNORMAL LOW (ref 98–111)
Creatinine, Ser: 4.81 mg/dL — ABNORMAL HIGH (ref 0.44–1.00)
Creatinine, Ser: 4.88 mg/dL — ABNORMAL HIGH (ref 0.44–1.00)
GFR, Estimated: 9 mL/min — ABNORMAL LOW (ref >60)
GFR, Estimated: 9 mL/min — ABNORMAL LOW (ref >60)
Glucose, Bld: 69 mg/dL — ABNORMAL LOW (ref 70–99)
Glucose, Bld: 74 mg/dL (ref 70–99)
Potassium: 4 mmol/L (ref 3.5–5.1)
Potassium: 4 mmol/L (ref 3.5–5.1)
Sodium: 127 mmol/L — ABNORMAL LOW (ref 135–145)
Sodium: 127 mmol/L — ABNORMAL LOW (ref 135–145)
Total Bilirubin: 0.8 mg/dL (ref 0.3–1.2)
Total Bilirubin: 0.6 mg/dL (ref 0.3–1.2)
Total Protein: 3.8 g/dL — ABNORMAL LOW (ref 6.5–8.1)
Total Protein: 3.8 g/dL — ABNORMAL LOW (ref 6.5–8.1)

## 2020-06-21 LAB — D-DIMER, QUANTITATIVE: D-Dimer, Quant: 1.31 ug/mL-FEU — ABNORMAL HIGH (ref 0.00–0.50)

## 2020-06-21 LAB — BASIC METABOLIC PANEL
Anion gap: 15 (ref 5–15)
BUN: 34 mg/dL — ABNORMAL HIGH (ref 8–23)
CO2: 16 mmol/L — ABNORMAL LOW (ref 22–32)
Calcium: 8 mg/dL — ABNORMAL LOW (ref 8.9–10.3)
Chloride: 95 mmol/L — ABNORMAL LOW (ref 98–111)
Creatinine, Ser: 4.97 mg/dL — ABNORMAL HIGH (ref 0.44–1.00)
GFR, Estimated: 9 mL/min — ABNORMAL LOW (ref >60)
Glucose, Bld: 81 mg/dL (ref 70–99)
Potassium: 4.6 mmol/L (ref 3.5–5.1)
Sodium: 126 mmol/L — ABNORMAL LOW (ref 135–145)

## 2020-06-21 LAB — BRAIN NATRIURETIC PEPTIDE: B Natriuretic Peptide: 276.9 pg/mL — ABNORMAL HIGH (ref 0.0–100.0)

## 2020-06-21 LAB — MAGNESIUM: Magnesium: 2.1 mg/dL (ref 1.7–2.4)

## 2020-06-21 LAB — C-REACTIVE PROTEIN: CRP: 3.6 mg/dL — ABNORMAL HIGH (ref <1.0)

## 2020-06-21 LAB — MRSA PCR SCREENING: MRSA by PCR: NEGATIVE

## 2020-06-21 MED ORDER — SODIUM BICARBONATE 650 MG PO TABS
650.0000 mg | ORAL_TABLET | Freq: Three times a day (TID) | ORAL | Status: DC
  Administered 2020-06-21 – 2020-06-24 (×12): 650 mg via ORAL
  Filled 2020-06-21 (×12): qty 1

## 2020-06-21 MED ORDER — DARBEPOETIN ALFA 60 MCG/0.3ML IJ SOSY
60.0000 ug | PREFILLED_SYRINGE | INTRAMUSCULAR | Status: DC
  Filled 2020-06-21: qty 0.3

## 2020-06-21 MED ORDER — POTASSIUM CHLORIDE CRYS ER 20 MEQ PO TBCR
40.0000 meq | EXTENDED_RELEASE_TABLET | Freq: Once | ORAL | Status: AC
  Administered 2020-06-21: 40 meq via ORAL
  Filled 2020-06-21: qty 2

## 2020-06-21 MED ORDER — POTASSIUM CHLORIDE 10 MEQ/100ML IV SOLN
10.0000 meq | INTRAVENOUS | Status: DC
  Administered 2020-06-21: 10 meq via INTRAVENOUS
  Filled 2020-06-21 (×2): qty 100

## 2020-06-21 MED ORDER — POTASSIUM CHLORIDE CRYS ER 20 MEQ PO TBCR: 40.0000 meq | EXTENDED_RELEASE_TABLET | Freq: Three times a day (TID) | ORAL | Status: DC

## 2020-06-21 NOTE — Progress Notes (Signed)
Nephrology Follow-Up Consult note   Assessment/Recommendations: Amy [name] is a/an 70 y.o. female with a past medical history significant for ESRD on PD admitted with multiple electrolyte derangements, diarrhea, C. difficile, possible seizure.   # ESRD: We will obtain outpatient PD prescription.  Because of hypokalemia will hold off on PD for now.  Likely restart PD tomorrow. # Volume/ hypertension: Volume status and blood pressure acceptable at this time.  Hold home blood pressure medications # Anemia of Chronic Kidney Disease: Hemoglobin 7.4.  Likely multifactorial with CKD and infection contributing.    Ferritin and iron sat high..    Start Aranesp 60 mcg weekly.  Transfuse for hemoglobin less than 7 # Secondary Hyperparathyroidism/Hyperphosphatemia: Continue calcitriol 0.5 MCG daily and Sensipar 30 mg daily. #Hyponatremia: Sodium 127.  Likely related to volume depletion and poor p.o. intake.  Improving. #Hypokalemia: Again potassium depletion.  Potassium 4 today.  Will repeat at noon to ensure this was not falsely elevated given significant rise from yesterday.  Continue repletion as needed #Acidosis: Mild metabolic acidosis likely related to diarrhea.    Will hold off on correcting for now given concern about hypokalemia #Possible seizure: Receiving Keppra.  Work-up per primary neurology  # Additional recommendations: - Dose all meds for creatinine clearance <10 ml/min  - Unless absolutely necessary, no MRIs with gadolinium.  - Implement save arm precautions. Prefer needle sticks in the dorsum of the hands or wrists. No blood pressure measurements in arm. - If blood transfusion is requested during hemodialysis sessions, please alert [place] prior to the session.     Recommendations conveyed to primary service.    [facility] 06/21/2020 8:32 AM  ___________________________________________________________  CC: ESRD  Interval History/Subjective:  Patient states she does not remember much from yesterday but overall feels better.  Less diarrhea over the past 24 hours.  Denies significant nausea.  Trying to eat more.   Medications:  Current Facility-Administered Medications  Medication Dose Route Frequency Provider Last Rate Last Admin  . acetaminophen (TYLENOL) tablet 650 mg  650 mg Oral Q6H PRN Norins, Heinz Knuckles, MD       Or  . acetaminophen (TYLENOL) suppository 650 mg  650 mg Rectal Q6H PRN Norins, Heinz Knuckles, MD      . calcitRIOL (ROCALTROL) capsule 0.5 mcg  0.5 mcg Oral Daily Reesa Chew, MD      . Chlorhexidine Gluconate Cloth 2 % PADS 6 each  6 each Topical Daily Norins, Heinz Knuckles, MD      . cinacalcet (SENSIPAR) tablet 30 mg  30 mg Oral Q breakfast Reesa Chew, MD      . fidaxomicin (DIFICID) tablet 200 mg  200 mg Oral BID Neena Rhymes, MD   200 mg at 06/20/20 2234  . heparin injection 5,000 Units  5,000 Units Subcutaneous Q8H Norins, Heinz Knuckles, MD   5,000 Units at 06/21/20 0617  . levETIRAcetam (KEPPRA) IVPB 500 mg/100 mL premix  500 mg Intravenous Q12H Perfecto Kingdom, MD 400 mL/hr at 06/21/20 0611 500 mg at 06/21/20 0611  . potassium chloride SA (KLOR-CON) CR tablet 40 mEq  40 mEq Oral Once Reesa Chew, MD      . sodium chloride flush (NS) 0.9 % injection 3 mL  3 mL Intravenous Once Carmin Muskrat, MD      . traZODone (DESYREL) tablet 25 mg  25 mg Oral QHS PRN Norins, Heinz Knuckles, MD          Review  of Systems: 10 systems reviewed and negative except per interval history/subjective  Physical Exam: Vitals:   06/20/20 2351 06/21/20 0411  BP: 124/83 134/77  Pulse: 100 (!) 103  Resp: 19 15  Temp: 98 F (36.7 C) 98.1 F (36.7 C)  SpO2: 99% 100%   No intake/output data recorded.  Intake/Output Summary (Last 24 hours) at 06/21/2020 6579 Last data filed at 06/21/2020 0300 Gross per 24 hour  Intake 739.35 ml  Output --  Net 739.35 ml   Constitutional: Ill-appearing, lying in bed, no  distress ENMT: ears and nose without scars or lesions, MMM CV: Tachycardia, trace edema in the lower extremities Respiratory: clear to auscultation, normal work of breathing Gastrointestinal: soft, mild tenderness to palpation Skin: no visible lesions or rashes Psych: alert, judgement/insight appropriate, appropriate mood and affect   Test Results I personally reviewed new and old clinical labs and radiology tests Lab Results  Component Value Date   NA 127 (L) 06/21/2020   K 4.0 06/21/2020   CL 96 (L) 06/21/2020   CO2 16 (L) 06/21/2020   BUN 33 (H) 06/21/2020   CREATININE 4.81 (H) 06/21/2020   GLU 103 04/30/2014   CALCIUM 7.6 (L) 06/21/2020   ALBUMIN 1.8 (L) 06/21/2020   PHOS 6.1 (H) 11/08/2018

## 2020-06-21 NOTE — Evaluation (Signed)
Physical Therapy Evaluation Patient Details Name: Amy [name] MRN: [id number] DOB: 1950-11-22 Today's Date: 06/21/2020   History of Present Illness  Amy [name] is a 70 y.o. female with medical history significant of ESRD on PD, HTN, multiple thyroid nodules. She had a hip fracture in December s/p ORIF. Recently hospitalized 1/25/-05/29/20 for watery diarrhea, hyponatremia, hypokalemia. She was found to be covid 19 positive but was not treated with no signs of respiratory involvement. had questionable seizure activity and was transfered to [facility] from [facility].    Clinical Impression  Pt admitted with above. Despite report from pt that she is unable to do anything because she is so weak pt was able to transfer OOB to chair with min/modA. Discussed with patient that even though she doesn't feel well from a medical stand point she is stronger than she thinks and needs to get up OOB daily and start using the [facility] instead of the [place]. Daughter in agreement. Pt able to tolerate seated LE exercises as well. OT provided pt with word search puzzles to help pass time as pt with depressed spirits about being in hospital again. RN present and witnessed how the transfer OOB went. Suggested to start with 1 hour sitting in the chair due to patients poor activity tolerance as noted by HR increased to 148bpm from 110 at rest during transfers. But then to get pt up at every meal to help improve activity tolerance and endurance. Acute PT to cont to follow.    Follow Up Recommendations Home health PT;Supervision/Assistance - 24 hour    Equipment Recommendations  None recommended by PT    Recommendations for Other Services       Precautions / Restrictions Precautions Precautions: Fall Precaution Comments: weakness, peritoneal diaylisis, swollen arms Restrictions Weight Bearing Restrictions: No      Mobility  Bed Mobility Overal bed mobility: Needs Assistance Bed Mobility: Rolling;Sidelying to Sit Rolling:  Min assist Sidelying to sit: Min assist;Mod assist       General bed mobility comments: modA for trunk elevation, directional verbal cues    Transfers Overall transfer level: Needs assistance Equipment used: 2 person hand held assist Transfers: Sit to/from [facility] Sit to Stand: Min assist;+2 safety/equipment Stand pivot transfers: Min assist;+2 safety/equipment       General transfer comment: increased time, pt guard with mild anxiety, minA to steady during powering up, pt able to step to chair, once OT left pt had to use bathroom, PT able to assist pt with sit to stand for hygiene s/p urinary incontinene with minA and R HHA  Ambulation/Gait             General Gait Details: not tested today  Stairs            Wheelchair Mobility    Modified Rankin (Stroke Patients Only)       Balance Overall balance assessment: Needs assistance Sitting-balance support: Feet supported;No upper extremity supported Sitting balance-Leahy Scale: Fair     Standing balance support: Single extremity supported Standing balance-Leahy Scale: Poor Standing balance comment: dependent on external support, decreased confidence                             Pertinent Vitals/Pain Pain Assessment: Faces Faces Pain Scale: Hurts little more Pain Location: buttocks, from freq episodes of diarrhea Pain Descriptors / Indicators: Sore Pain Intervention(s): Monitored during session    Home Living Family/patient expects to be discharged to::  Private residence Living Arrangements: Spouse/significant other Available Help at Discharge: Family;Available 24 hours/day Type of Home: House Home Access: Stairs to enter Entrance Stairs-Rails: None Entrance Stairs-Number of Steps: 2 Home Layout: Able to live on main level with bedroom/bathroom;Two level Home Equipment: Walker - 2 wheels;Cane - single point;Bedside commode;Shower seat      Prior Function Level of  Independence: Needs assistance   Gait / Transfers Assistance Needed: pt has been using RW since her hip fracture in dec  ADL's / Homemaking Assistance Needed: spouse assisting with ADLs        Hand Dominance   Dominant Hand: Right    Extremity/Trunk Assessment   Upper Extremity Assessment Upper Extremity Assessment: Defer to OT evaluation (bilat UE swelling limiting ROM, noted weakness)    Lower Extremity Assessment Lower Extremity Assessment: Generalized weakness    Cervical / Trunk Assessment Cervical / Trunk Assessment: Normal  Communication   Communication: No difficulties  Cognition Arousal/Alertness: Awake/alert Behavior During Therapy: Flat affect Overall Cognitive Status: Within Functional Limits for tasks assessed                                 General Comments: pt self limiting/dec confidence in abilities, anxious - didn't want daughter to leave, depressed spirits      General Comments General comments (skin integrity, edema, etc.): pt with bilat UE edema and bruising, after pt was set up in chair and PT has just left room pt stated she had to go to the bathroom really bad.......PT entered room however wasn't in time and pt had urinary incontinence, pt minA to stand and change linen in chair and gown    Exercises General Exercises - Lower Extremity Ankle Circles/Pumps: AROM;Both;10 reps;Supine Long Arc Quad: AROM;Both;5 reps;Seated Heel Slides: AAROM;Both;10 reps;Supine Other Exercises Other Exercises: bridging in supine x 10 reps   Assessment/Plan    PT Assessment Patient needs continued PT services  PT Problem List Decreased strength;Decreased activity tolerance;Decreased balance;Decreased mobility;Decreased knowledge of use of DME       PT Treatment Interventions DME instruction;Gait training;Stair training;Functional mobility training;Therapeutic activities;Therapeutic exercise;Balance training;Neuromuscular re-education    PT Goals  (Current goals can be found in the Care Plan section)  Acute Rehab PT Goals Patient Stated Goal: home PT Goal Formulation: With patient/family Time For Goal Achievement: 07/05/20 Potential to Achieve Goals: Good    Frequency Min 3X/week   Barriers to discharge        Co-evaluation PT/OT/SLP Co-Evaluation/Treatment: Yes Reason for Co-Treatment: To address functional/ADL transfers PT goals addressed during session: Mobility/safety with mobility         AM-PAC PT "6 Clicks" Mobility  Outcome Measure Help needed turning from your back to your side while in a flat bed without using bedrails?: A Little Help needed moving from lying on your back to sitting on the side of a flat bed without using bedrails?: A Little Help needed moving to and from a bed to a chair (including a wheelchair)?: A Little Help needed standing up from a chair using your arms (e.g., wheelchair or bedside chair)?: A Little Help needed to walk in hospital room?: A Little Help needed climbing 3-5 steps with a railing? : A Lot 6 Click Score: 17    End of Session Equipment Utilized During Treatment: Gait belt Activity Tolerance: Patient tolerated treatment well Patient left: in chair;with call bell/phone within reach;with family/visitor present (dtr present) Nurse Communication: Mobility status (pt with  urinary incontinence) PT Visit Diagnosis: Muscle weakness (generalized) (M62.81)    Time: 1351-1440 PT Time Calculation (min) (ACUTE ONLY): 49 min   Charges:   PT Evaluation $PT Eval Moderate Complexity: 1 Mod PT Treatments $Therapeutic Activity: 8-22 mins        Kittie Plater, PT, DPT Acute Rehabilitation Services Pager #: [phone] Office #: [phone]   Berline Lopes 06/21/2020, 3:08 PM

## 2020-06-21 NOTE — Evaluation (Signed)
Occupational Therapy Evaluation Patient Details Name: Amy [name] MRN: [id number] DOB: [birth year] Today's Date: 06/21/2020    History of Present Illness Amy [name] is a 70 y.o. female with medical history significant of ESRD on PD, HTN, multiple thyroid nodules. She had a hip fracture in December s/p ORIF. Recently hospitalized 1/25/-05/29/20 for watery diarrhea, hyponatremia, hypokalemia. She was found to be covid 19 positive but was not treated with no signs of respiratory involvement. had questionable seizure activity and was transfered to [facility] from [facility].   Clinical Impression   This 70 yo female admitted with above presents to acute OT with PLOF of being able to do her own basic ADLs. Currently she is min A-total A for basic ADLs and associated mobility. She will benefit from acute OT with follow up  [place]. Edema in UEs is limiting her AROM and making her arms heavy for her thus affecting her independence with basic ADLs.    Follow Up Recommendations  Home health OT;Supervision/Assistance - 24 hour    Equipment Recommendations  None recommended by OT       Precautions / Restrictions Precautions Precautions: Fall Precaution Comments: weakness, peritoneal diaylisis, swollen arms Restrictions Weight Bearing Restrictions: No      Mobility Bed Mobility Overal bed mobility: Needs Assistance Bed Mobility: Rolling;Sidelying to Sit Rolling: Min assist Sidelying to sit: Min assist;Mod assist       General bed mobility comments: modA for trunk elevation, directional verbal cues    Transfers Overall transfer level: Needs assistance Equipment used: 2 person hand held assist Transfers: Sit to/from [facility] Sit to Stand: Min assist;+2 safety/equipment Stand pivot transfers: Min assist;+2 safety/equipment       General transfer comment: increased time, pt guard with mild anxiety, minA to steady during powering up, pt able to step to chair, once OT left pt had  to use bathroom, PT able to assist pt with sit to stand for hygiene s/p urinary incontinene with minA and R HHA    Balance Overall balance assessment: Needs assistance Sitting-balance support: Feet supported;No upper extremity supported Sitting balance-Leahy Scale: Fair     Standing balance support: Single extremity supported Standing balance-Leahy Scale: Poor Standing balance comment: dependent on external support, decreased confidence                           ADL either performed or assessed with clinical judgement   ADL Overall ADL's : Needs assistance/impaired Eating/Feeding: Total assistance;Bed level Eating/Feeding Details (indicate cue type and reason): due to edema in arms/hands Grooming: Maximal assistance;Bed level   Upper Body Bathing: Maximal assistance;Bed level   Lower Body Bathing: Maximal assistance Lower Body Bathing Details (indicate cue type and reason): min A sit<>stand Upper Body Dressing : Maximal assistance;Sitting   Lower Body Dressing: Total assistance Lower Body Dressing Details (indicate cue type and reason): min A sit<>stand Toilet Transfer: Minimal assistance;Stand-pivot;+2 for physical assistance Toilet Transfer Details (indicate cue type and reason): bed>recliner on her right Toileting- Clothing Manipulation and Hygiene: Total assistance Toileting - Clothing Manipulation Details (indicate cue type and reason): min A sit<>stand             Vision Patient Visual Report: No change from baseline              Pertinent Vitals/Pain Pain Assessment: Faces Faces Pain Scale: Hurts little more Pain Location: buttocks, from freq episodes of diarrhea Pain Descriptors / Indicators: Sore Pain Intervention(s): Monitored during session  Hand Dominance Right   Extremity/Trunk Assessment Upper Extremity Assessment Upper Extremity Assessment: RUE deficits/detail;LUE deficits/detail RUE Deficits / Details: Bil UE edema from not being  on peritoneal dialysis for several days. Also generalized weakness throughout, full PROM but needs A for shoulder ROM;elbow distally 3/5 except digits due to edema RUE Coordination: decreased fine motor;decreased gross motor LUE Deficits / Details: Bil UE edema from not being on peritoneal dialysis for several days. Also generalized weakness throughout, full PROM but needs A for shoulder ROM;elbow distally 3/5 except digits due to edema LUE Coordination: decreased fine motor;decreased gross motor   Lower Extremity Assessment Lower Extremity Assessment: Generalized weakness   Cervical / Trunk Assessment Cervical / Trunk Assessment: Normal   Communication Communication Communication: No difficulties   Cognition Arousal/Alertness: Awake/alert Behavior During Therapy: WFL for tasks assessed/performed Overall Cognitive Status: Within Functional Limits for tasks assessed                                 General Comments: pt self limiting/dec confidence in abilities, anxious - didn't want daughter to leave, depressed spirits   General Comments  pt with bilat UE edema and bruising, after pt was set up in chair and PT has just left room pt stated she had to go to the bathroom really bad.......PT entered room however wasn't in time and pt had urinary incontinence, pt minA to stand and change linen in chair and gown    Exercises Exercises: General Lower Extremity;Other exercises General Exercises - Lower Extremity Ankle Circles/Pumps: AROM;Both;10 reps;Supine Long Arc Quad: AROM;Both;5 reps;Seated Heel Slides: AAROM;Both;10 reps;Supine Other Exercises Other Exercises: Pt given exercise squeeze ball for Bil hands        Home Living Family/patient expects to be discharged to:: Private residence Living Arrangements: Spouse/significant other Available Help at Discharge: Family;Available 24 hours/day Type of Home: House Home Access: Stairs to enter [employer] of  Steps: 2 Entrance Stairs-Rails: None Home Layout: Able to live on main level with bedroom/bathroom;Two level     Bathroom Shower/Tub: Occupational psychologist: Standard     Home Equipment: Environmental consultant - 2 wheels;Cane - single point;Bedside commode;Shower seat          Prior Functioning/Environment Level of Independence: Needs assistance  Gait / Transfers Assistance Needed: pt has been using RW since her hip fracture in dec ADL's / Homemaking Assistance Needed: spouse assisting with ADLs   Comments: Indep without assist device for ADLs, household and community mobilization; works from home for [employer].  Denies additional fall history.        OT Problem List: Decreased strength;Decreased range of motion;Impaired balance (sitting and/or standing);Decreased activity tolerance;Impaired UE functional use;Increased edema;Pain      OT Treatment/Interventions: Self-care/ADL training;DME and/or AE instruction;Patient/family education;Balance training;Therapeutic activities;Therapeutic exercise    OT Goals(Current goals can be found in the care plan section) Acute Rehab OT Goals Patient Stated Goal: to go home OT Goal Formulation: With patient/family Time For Goal Achievement: 07/05/20 Potential to Achieve Goals: Good  OT Frequency: Min 2X/week           Co-evaluation PT/OT/SLP Co-Evaluation/Treatment: Yes Reason for Co-Treatment: For patient/therapist safety;To address functional/ADL transfers PT goals addressed during session: Mobility/safety with mobility OT goals addressed during session: ADL's and self-care;Strengthening/ROM      AM-PAC OT "6 Clicks" Daily Activity     Outcome Measure Help from another person eating meals?: Total Help from another person  taking care of personal grooming?: A Lot Help from another person toileting, which includes using toliet, bedpan, or urinal?: A Lot Help from another person bathing (including washing, rinsing,  drying)?: A Lot Help from another person to put on and taking off regular upper body clothing?: A Lot Help from another person to put on and taking off regular lower body clothing?: Total 6 Click Score: 10   End of Session Equipment Utilized During Treatment: Gait belt Nurse Communication: Mobility status (RN in room for transfer)  Activity Tolerance: Patient tolerated treatment well Patient left: in chair;with call bell/phone within reach;with family/visitor present;with nursing/sitter in room  OT Visit Diagnosis: Unsteadiness on feet (R26.81);Other abnormalities of gait and mobility (R26.89);Muscle weakness (generalized) (M62.81);Pain Pain - part of body:  (bottom)                Time: 1351-1420 OT Time Calculation (min): 29 min Charges:  OT General Charges $OT Visit: 1 Visit OT Evaluation $OT Eval Moderate Complexity: 1 Mod  [address], OTR/L Acute [employer] Pager [phone] Office [phone]     Almon Register 06/21/2020, 5:43 PM

## 2020-06-21 NOTE — Progress Notes (Signed)
PROGRESS NOTE                                                                                                                                                                                                             Patient Demographics:    Amy [name], is a 70 y.o. female, DOB - 09-30-1950, GUY:[id number]  Outpatient Primary MD for the patient is Caryn Section Kirstie Peri, MD    LOS - 2  Admit date - 06/19/2020    Chief Complaint  Patient presents with  . Code Stroke       Brief Narrative (HPI from H&P)  Amy [name] is a 69 y.o. female with medical history significant of ESRD on PD, HTN, multiple thyroid nodules. She had a hip fracture in December s/p ORIF. Recently hospitalized 1/25/-05/29/20 for watery diarrhea, hyponatremia, hypokalemia. She was found to be covid 19 positive but was not treated with no signs of respiratory involvement. She did test positive for C. Diff by PCR and was started on oral Vancomycin with a 9 day Rx at discharge which she was unable to get filled. By her report she was treated with clindamycin. She has continued to have copious watery diarrhea with increased weakness. She was observed to have a very brief episode of rightward gaze with slumped head that lasted approximately 1 min. EMS was activated and transported the patient to [facility] for evaluation.  Her work-up was consistent with severe dehydration with severe hypokalemia, hypomagnesemia, possible seizures and she was admitted.    Subjective:   Patient in bed, appears comfortable, denies any headache, no fever, no chest pain or pressure, no shortness of breath , no abdominal pain. No focal weakness.   Assessment  & Plan :     1. Seizure with postictal state caused by severe electrolyte abnormalities including hyponatremia, hypomagnesemia, hypokalemia caused by C. difficile diarrhea - MRI negative, EEG shows postictal state, seen by neurology and  nephrology.  Potassium and magnesium now stable after aggressive replacement, has been hydrated with IV fluids, on IV Keppra and currently seems to be stabilizing.  PT OT to monitor.  2.  C. difficile infection present on admission.  Being treated with Dificid as she seems to have failed 10-day course of vancomycin.  3.  Recent COVID-19 incidental infection.  Supportive care.  4.  ESRD.  On  PD.  Currently on hold renal following.  Placed on oral bicarb replacement for metabolic acidosis.  5.  Anemia of chronic disease.  Will monitor, type screen will be done as with heme dilution expected to get worse.  No signs of ongoing bleeding.  Anemia panel suggestive of anemia of chronic disease.  Type screen done as well.        Condition - Guarded  Family Communication  : Wayna Chalet [phone]  on 06/20/2020 at 11:56 AM message left, updated on 06/21/2020  Code Status : Full  Consults  :  Neuro, Renal  PUD Prophylaxis : None   Procedures  :     MRI Brain - Non acute  EEG - Post ictial state      Disposition Plan  :    Status is: Inpatient  Remains inpatient appropriate because:IV treatments appropriate due to intensity of illness or inability to take PO   Dispo: The patient is from: Home              Anticipated d/c is to: Home              Anticipated d/c date is: 3 days              Patient currently is not medically stable to d/c.   Difficult to place patient No   DVT Prophylaxis  :   Heparin   Lab Results  Component Value Date   PLT 348 06/21/2020    Diet :  Diet Order            Diet renal with fluid restriction Fluid restriction: 1200 mL Fluid; Room service appropriate? Yes; Fluid consistency: Thin  Diet effective now                  Inpatient Medications  Scheduled Meds: . calcitRIOL  0.5 mcg Oral Daily  . Chlorhexidine Gluconate Cloth  6 each Topical Daily  . cinacalcet  30 mg Oral Q breakfast  . darbepoetin (ARANESP) injection - NON-DIALYSIS   60 mcg Subcutaneous Q Sat-1800  . fidaxomicin  200 mg Oral BID  . heparin  5,000 Units Subcutaneous Q8H  . potassium chloride  40 mEq Oral Once  . sodium bicarbonate  650 mg Oral TID  . sodium chloride flush  3 mL Intravenous Once   Continuous Infusions: . levETIRAcetam 500 mg (06/21/20 0611)   PRN Meds:.acetaminophen **OR** [DISCONTINUED] acetaminophen, traZODone  Antibiotics  :    Anti-infectives (From admission, onward)   Start     Dose/Rate Route Frequency Ordered Stop   06/19/20 2200  fidaxomicin (DIFICID) tablet 200 mg        200 mg Oral 2 times daily 06/19/20 2005 06/29/20 2159       Time Spent in minutes  30   Lala Lund M.D on 06/21/2020 at 9:36 AM  To page go to [email or web address]   [facility] -  Office  [phone]    See all Orders from today for further details    Objective:   Vitals:   06/20/20 1931 06/20/20 2351 06/21/20 0411 06/21/20 0800  BP: 128/80 124/83 134/77 137/75  Pulse: 97 100 (!) 103 98  Resp: (!) 24 19 15 18   Temp: 97.8 F (36.6 C) 98 F (36.7 C) 98.1 F (36.7 C) 98.6 F (37 C)  TempSrc: Oral Oral Oral Oral  SpO2: 100% 99% 100% 100%  Weight: 52.6 kg     Height: 5\' 4"  (1.626 m)  Wt Readings from Last 3 Encounters:  06/20/20 52.6 kg  05/31/20 50.8 kg  04/26/20 53.5 kg     Intake/Output Summary (Last 24 hours) at 06/21/2020 0936 Last data filed at 06/21/2020 0300 Gross per 24 hour  Intake 689.35 ml  Output --  Net 689.35 ml     Physical Exam  Awake Alert, No new F.N deficits, Normal affect [place].AT,PERRAL Supple Neck,No JVD, No cervical lymphadenopathy appriciated.  Symmetrical Chest wall movement, Good air movement bilaterally, CTAB RRR,No Gallops, Rubs or new Murmurs, No Parasternal Heave +ve B.Sounds, Abd Soft, No tenderness, No organomegaly appriciated, No rebound - guarding or rigidity.  Stable PD site. No Cyanosis, trace edema     Data Review:    CBC Recent Labs  Lab 06/19/20 1613  06/19/20 1617 06/20/20 0313 06/21/20 0623  WBC 16.7*  --  13.9* 11.5*  HGB 9.0* 9.2* 8.0* 7.4*  HCT 25.6* 27.0* 22.9* 21.3*  PLT 443*  --  345 348  MCV 87.7  --  86.1 88.4  MCH 30.8  --  30.1 30.7  MCHC 35.2  --  34.9 34.7  RDW 16.5*  --  16.7* 18.2*  LYMPHSABS 1.1  --   --  1.1  MONOABS 1.0  --   --  0.7  EOSABS 0.0  --   --  0.2  BASOSABS 0.0  --   --  0.0    Recent Labs  Lab 06/19/20 1613 06/19/20 1617 06/20/20 0313 06/20/20 1205 06/20/20 1805 06/21/20 0500 06/21/20 0623  NA 123* 122* 124*  --  125* 127* 127*  K 2.1* 2.1* 2.1* 2.3* 2.7* 4.0 4.0  CL 86* 83* 92*  --  93* 96* 97*  CO2 15*  --  17*  --  17* 16* 15*  GLUCOSE 158* 150* 78  --  78 69* 74  BUN 36* 39* 35*  --  34* 33* 33*  CREATININE 5.01* 4.70* 4.60*  --  4.74* 4.81* 4.88*  CALCIUM 7.3*  --  6.9*  --  7.3* 7.6* 7.7*  AST 38  --   --   --   --  36 35  ALT 27  --   --   --   --  20 20  ALKPHOS 86  --   --   --   --  93 84  BILITOT 0.5  --   --   --   --  0.8 0.6  ALBUMIN 2.0*  --   --   --   --  1.8* 1.8*  MG  --   --  1.4*  --   --  2.1  --   CRP  --   --   --   --   --  3.6*  --   DDIMER  --   --   --   --   --   --  1.31*  INR 1.1  --   --   --   --   --   --   BNP  --   --   --   --   --   --  276.9*    ------------------------------------------------------------------------------------------------------------------ No results for input(s): CHOL, HDL, LDLCALC, TRIG, CHOLHDL, LDLDIRECT in the last 72 hours.  No results found for: HGBA1C ------------------------------------------------------------------------------------------------------------------ No results for input(s): TSH, T4TOTAL, T3FREE, THYROIDAB in the last 72 hours.  Invalid input(s): FREET3  Cardiac Enzymes No results for input(s): CKMB, TROPONINI, MYOGLOBIN in the last 168 hours.  Invalid input(s): CK ------------------------------------------------------------------------------------------------------------------  Component  Value Date/Time   BNP 276.9 (H) 06/21/2020 9381    Micro Results Recent Results (from the past 240 hour(s))  MRSA PCR Screening     Status: None   Collection Time: 06/21/20 12:34 AM   Specimen: Nasopharyngeal  Result Value Ref Range Status   MRSA by PCR NEGATIVE NEGATIVE Final    Comment:        The GeneXpert MRSA Assay (FDA approved for NASAL specimens only), is one component of a comprehensive MRSA colonization surveillance program. It is not intended to diagnose MRSA infection nor to guide or monitor treatment for MRSA infections. Performed at [facility], [place] [address] [place], [place] [address]     Radiology Reports MR BRAIN WO CONTRAST  Result Date: 06/20/2020 CLINICAL DATA:  Left-sided neglect and confusion.  Seizure activity. EXAM: MRI HEAD WITHOUT CONTRAST TECHNIQUE: Multiplanar, multiecho pulse sequences of the brain and surrounding structures were obtained without intravenous contrast. COMPARISON:  Head CT 06/19/2020 FINDINGS: Brain: There is no evidence of an acute infarct, intracranial hemorrhage, mass, midline shift, or extra-axial fluid collection. T2 hyperintensities in the cerebral white matter bilaterally are nonspecific but compatible with mild chronic small vessel ischemic disease. Chronic lacunar infarcts are noted in the right caudate nucleus and posterior right corona radiata. A chronic lacunar infarct is also noted in the right pons. There is moderate generalized cerebral atrophy. Vascular: Major intracranial vascular flow voids are preserved. Skull and upper cervical spine: Unremarkable bone marrow signal. Moderate to severe disc degeneration at C4-5 and C5-6 and severe left facet arthrosis at C4-5 with grade 1 anterolisthesis. Sinuses/Orbits: Mild bilateral ethmoid air cell mucosal thickening. Clear mastoid air cells. Unremarkable orbits. Other: None. IMPRESSION: 1. No acute intracranial abnormality. 2. Chronic small-vessel ischemia with multiple chronic  lacunar infarcts. Electronically Signed   By: Logan Bores M.D.   On: 06/20/2020 10:09   DG Chest Portable 1 View  Result Date: 05/29/2020 CLINICAL DATA:  Weakness EXAM: PORTABLE CHEST 1 VIEW COMPARISON:  12/16/2019 and prior. FINDINGS: No pneumothorax or pleural effusion. Cardiomediastinal silhouette within normal limits. Hazy appearance of the peripheral lung fields. No acute osseous abnormality. IMPRESSION: Hazy appearance of the peripheral lung fields may be artifactual however cannot exclude atypical infection. Electronically Signed   By: Primitivo Gauze M.D.   On: 05/29/2020 16:41   EEG adult  Result Date: 06/19/2020 [name], MD     06/19/2020  7:42 PM Patient Name: SHANTICE MENGER MRN: [id number] Epilepsy Attending: [name] Referring Physician/Provider: Anibal Henderson, NP Date: 06/19/2020 Duration: Patient history: 70yo F with sudden onset speech disturbance, right gaze deviation and seizure like episode. EEG to evaluate for seizure  Level of alertness: Awake AEDs during EEG study: LEV Technical aspects: This EEG study was done with scalp electrodes positioned according to the 10-20 International system of electrode placement. Electrical activity was acquired at a sampling rate of 500Hz  and reviewed with a high frequency filter of 70Hz  and a low frequency filter of 1Hz . EEG data were recorded continuously and digitally stored. Description: No clear posterior dominant rhythm was seen.  EEG showed continuous generalized and maximal bifrontal 5-9hz  theta-delta activity as well as intermittent 2-3hz  delta slowing. Hyperventilation and photic stimulation were not performed.   ABNORMALITY -Continuous slow, generalized and maximal bifrontal region IMPRESSION: This study is suggestive of cortical dysfunction in bifrontal region which is non specific but could be seen due to underlying structural abnormality, post-ictal state. There is also mild to moderate diffuse  encephalopathy, nonspecific  etiology. No seizures or epileptiform discharges were seen throughout the recording. [name]   CT HEAD CODE STROKE WO CONTRAST  Result Date: 06/19/2020 CLINICAL DATA:  Code stroke.  Acute stroke presentation. EXAM: CT HEAD WITHOUT CONTRAST TECHNIQUE: Contiguous axial images were obtained from the base of the skull through the vertex without intravenous contrast. COMPARISON:  12/16/2019 FINDINGS: Brain: Generalized atrophy. No focal infarction seen affecting the brainstem or cerebellum. Cerebral hemispheres show chronic small-vessel ischemic changes of the hemispheric white matter and the basal ganglia regions. No sign of acute infarction, mass lesion, hemorrhage hydrocephalus or extra-axial collection. Vascular: There is atherosclerotic calcification of the major vessels at the base of the brain. Skull: Negative Sinuses/Orbits: Clear/normal Other: None ASPECTS ([place] Stroke Program Early CT Score) - Ganglionic level infarction (caudate, lentiform nuclei, internal capsule, insula, M1-M3 cortex): 7 - Supraganglionic infarction (M4-M6 cortex): 3 Total score (0-10 with 10 being normal): 10 IMPRESSION: 1. No acute finding by CT. Atrophy and chronic small-vessel ischemic changes of the cerebral hemispheric white matter and basal ganglia regions. 2. ASPECTS is 10. 3. These results were communicated to Dr. Charlsie Merles At 4:25 pmon 2/17/2022by text page via the [facility] messaging system. Electronically Signed   By: Nelson Chimes M.D.   On: 06/19/2020 16:25   CT ANGIO HEAD CODE STROKE  Result Date: 06/19/2020 CLINICAL DATA:  Left-sided neglect. Seizure activity. Acute stroke suspected. EXAM: CT ANGIOGRAPHY HEAD AND NECK TECHNIQUE: Multidetector CT imaging of the head and neck was performed using the standard protocol during bolus administration of intravenous contrast. Multiplanar CT image reconstructions and MIPs were obtained to evaluate the vascular anatomy. Carotid stenosis measurements (when applicable) are  obtained utilizing NASCET criteria, using the distal internal carotid diameter as the denominator. CONTRAST:  45mL OMNIPAQUE IOHEXOL 350 MG/ML SOLN COMPARISON:  Head CT earlier same day FINDINGS: CTA NECK FINDINGS Aortic arch: Mild atherosclerotic change of the aortic arch. Branching pattern is normal without origin stenosis. Right carotid system: Common carotid artery widely patent to the bifurcation. Carotid bifurcation shows minimal plaque but there is no stenosis. Cervical ICA widely patent. Left carotid system: Common carotid artery widely patent to the bifurcation. Mild plaque at the bifurcation and ICA bulb but no stenosis. Cervical ICA widely patent. Vertebral arteries: Large vertebral arteries widely patent at their origins and through the cervical region to the foramen magnum. Skeleton: Mid cervical spondylosis which could be painful. Other neck: No adenopathy. Enlarged heterogeneous thyroid gland. Largest nodule in the isthmus measures up to 2 cm in diameter. Upper chest: Mild pleural and parenchymal scarring at the lung apices. Review of the MIP images confirms the above findings CTA HEAD FINDINGS Anterior circulation: Both internal carotid arteries are patent through the skull base and siphon regions. There is ordinary atherosclerotic calcification in the carotid siphon regions but without stenosis greater than 30% suspected. The anterior and middle cerebral vessels are patent. No large or medium vessel occlusion identified. Distal branch vessels do show atherosclerotic irregularity. Posterior circulation: Both vertebral arteries widely patent through the foramen magnum to the basilar. There is focal plaque of the left vertebral artery V4 segment with stenosis estimated at 30-50%. No vertebral stenosis. Posterior circulation branch vessels are patent. Distal branch vessels do show atherosclerotic irregularity. Venous sinuses: Patent and normal. Anatomic variants: None significant. Review of the MIP  images confirms the above findings IMPRESSION: 1. No large or medium vessel occlusion. Widespread intracranial distal vessel atherosclerotic irregularity. 2. Minimal atherosclerotic change at both carotid bifurcations but  without stenosis. 3. 30-50% stenosis of the left vertebral artery V4 segment. 4. Enlarged heterogeneous thyroid gland. Largest nodule in the isthmus measures up to 2 cm in diameter. Recommend thyroid US (ref: J Am Coll Radiol. 2015 Feb;12(2): 143-50). Aortic Atherosclerosis (ICD10-I70.0). Electronically Signed   By: Nelson Chimes M.D.   On: 06/19/2020 17:08   CT ANGIO NECK CODE STROKE  Result Date: 06/19/2020 CLINICAL DATA:  Left-sided neglect. Seizure activity. Acute stroke suspected. EXAM: CT ANGIOGRAPHY HEAD AND NECK TECHNIQUE: Multidetector CT imaging of the head and neck was performed using the standard protocol during bolus administration of intravenous contrast. Multiplanar CT image reconstructions and MIPs were obtained to evaluate the vascular anatomy. Carotid stenosis measurements (when applicable) are obtained utilizing NASCET criteria, using the distal internal carotid diameter as the denominator. CONTRAST:  78mL OMNIPAQUE IOHEXOL 350 MG/ML SOLN COMPARISON:  Head CT earlier same day FINDINGS: CTA NECK FINDINGS Aortic arch: Mild atherosclerotic change of the aortic arch. Branching pattern is normal without origin stenosis. Right carotid system: Common carotid artery widely patent to the bifurcation. Carotid bifurcation shows minimal plaque but there is no stenosis. Cervical ICA widely patent. Left carotid system: Common carotid artery widely patent to the bifurcation. Mild plaque at the bifurcation and ICA bulb but no stenosis. Cervical ICA widely patent. Vertebral arteries: Large vertebral arteries widely patent at their origins and through the cervical region to the foramen magnum. Skeleton: Mid cervical spondylosis which could be painful. Other neck: No adenopathy. Enlarged  heterogeneous thyroid gland. Largest nodule in the isthmus measures up to 2 cm in diameter. Upper chest: Mild pleural and parenchymal scarring at the lung apices. Review of the MIP images confirms the above findings CTA HEAD FINDINGS Anterior circulation: Both internal carotid arteries are patent through the skull base and siphon regions. There is ordinary atherosclerotic calcification in the carotid siphon regions but without stenosis greater than 30% suspected. The anterior and middle cerebral vessels are patent. No large or medium vessel occlusion identified. Distal branch vessels do show atherosclerotic irregularity. Posterior circulation: Both vertebral arteries widely patent through the foramen magnum to the basilar. There is focal plaque of the left vertebral artery V4 segment with stenosis estimated at 30-50%. No vertebral stenosis. Posterior circulation branch vessels are patent. Distal branch vessels do show atherosclerotic irregularity. Venous sinuses: Patent and normal. Anatomic variants: None significant. Review of the MIP images confirms the above findings IMPRESSION: 1. No large or medium vessel occlusion. Widespread intracranial distal vessel atherosclerotic irregularity. 2. Minimal atherosclerotic change at both carotid bifurcations but without stenosis. 3. 30-50% stenosis of the left vertebral artery V4 segment. 4. Enlarged heterogeneous thyroid gland. Largest nodule in the isthmus measures up to 2 cm in diameter. Recommend thyroid US (ref: J Am Coll Radiol. 2015 Feb;12(2): 143-50). Aortic Atherosclerosis (ICD10-I70.0). Electronically Signed   By: Nelson Chimes M.D.   On: 06/19/2020 17:08

## 2020-06-22 ENCOUNTER — Inpatient Hospital Stay (HOSPITAL_COMMUNITY): Payer: Medicare Other

## 2020-06-22 DIAGNOSIS — E871 Hypo-osmolality and hyponatremia: Secondary | ICD-10-CM | POA: Diagnosis not present

## 2020-06-22 LAB — CBC
HCT: 25.5 % — ABNORMAL LOW (ref 36.0–46.0)
HCT: 28 % — ABNORMAL LOW (ref 36.0–46.0)
Hemoglobin: 9.3 g/dL — ABNORMAL LOW (ref 12.0–15.0)
Hemoglobin: 10.1 g/dL — ABNORMAL LOW (ref 12.0–15.0)
MCH: 30.9 pg (ref 26.0–34.0)
MCH: 30.6 pg (ref 26.0–34.0)
MCHC: 36.5 g/dL — ABNORMAL HIGH (ref 30.0–36.0)
MCHC: 36.1 g/dL — ABNORMAL HIGH (ref 30.0–36.0)
MCV: 84.7 fL (ref 80.0–100.0)
MCV: 84.8 fL (ref 80.0–100.0)
Platelets: 330 10*3/uL (ref 150–400)
Platelets: 307 10*3/uL (ref 150–400)
RBC: 3.01 MIL/uL — ABNORMAL LOW (ref 3.87–5.11)
RBC: 3.3 MIL/uL — ABNORMAL LOW (ref 3.87–5.11)
RDW: 17.1 % — ABNORMAL HIGH (ref 11.5–15.5)
RDW: 17.2 % — ABNORMAL HIGH (ref 11.5–15.5)
WBC: 11.4 10*3/uL — ABNORMAL HIGH (ref 4.0–10.5)
WBC: 14.1 10*3/uL — ABNORMAL HIGH (ref 4.0–10.5)
nRBC: 0 % (ref 0.0–0.2)
nRBC: 0 % (ref 0.0–0.2)

## 2020-06-22 LAB — COMPREHENSIVE METABOLIC PANEL
ALT: 20 U/L (ref 0–44)
AST: 29 U/L (ref 15–41)
Albumin: 1.7 g/dL — ABNORMAL LOW (ref 3.5–5.0)
Alkaline Phosphatase: 82 U/L (ref 38–126)
Anion gap: 11 (ref 5–15)
BUN: 38 mg/dL — ABNORMAL HIGH (ref 8–23)
CO2: 19 mmol/L — ABNORMAL LOW (ref 22–32)
Calcium: 7.8 mg/dL — ABNORMAL LOW (ref 8.9–10.3)
Chloride: 96 mmol/L — ABNORMAL LOW (ref 98–111)
Creatinine, Ser: 5.26 mg/dL — ABNORMAL HIGH (ref 0.44–1.00)
GFR, Estimated: 8 mL/min — ABNORMAL LOW (ref >60)
Glucose, Bld: 90 mg/dL (ref 70–99)
Potassium: 4.6 mmol/L (ref 3.5–5.1)
Sodium: 126 mmol/L — ABNORMAL LOW (ref 135–145)
Total Bilirubin: 0.5 mg/dL (ref 0.3–1.2)
Total Protein: 3.5 g/dL — ABNORMAL LOW (ref 6.5–8.1)

## 2020-06-22 LAB — PREPARE RBC (CROSSMATCH)

## 2020-06-22 LAB — CBC WITH DIFFERENTIAL/PLATELET
Abs Immature Granulocytes: 0.09 10*3/uL — ABNORMAL HIGH (ref 0.00–0.07)
Basophils Absolute: 0 10*3/uL (ref 0.0–0.1)
Basophils Relative: 0 %
Eosinophils Absolute: 0.2 10*3/uL (ref 0.0–0.5)
Eosinophils Relative: 2 %
HCT: 19.1 % — ABNORMAL LOW (ref 36.0–46.0)
Hemoglobin: 6.7 g/dL — CL (ref 12.0–15.0)
Immature Granulocytes: 1 %
Lymphocytes Relative: 11 %
Lymphs Abs: 1.2 10*3/uL (ref 0.7–4.0)
MCH: 30.6 pg (ref 26.0–34.0)
MCHC: 35.1 g/dL (ref 30.0–36.0)
MCV: 87.2 fL (ref 80.0–100.0)
Monocytes Absolute: 0.7 10*3/uL (ref 0.1–1.0)
Monocytes Relative: 7 %
Neutro Abs: 8.6 10*3/uL — ABNORMAL HIGH (ref 1.7–7.7)
Neutrophils Relative %: 79 %
Platelets: 345 10*3/uL (ref 150–400)
RBC: 2.19 MIL/uL — ABNORMAL LOW (ref 3.87–5.11)
RDW: 18.6 % — ABNORMAL HIGH (ref 11.5–15.5)
WBC: 10.9 10*3/uL — ABNORMAL HIGH (ref 4.0–10.5)
nRBC: 0 % (ref 0.0–0.2)

## 2020-06-22 LAB — C-REACTIVE PROTEIN: CRP: 3 mg/dL — ABNORMAL HIGH (ref <1.0)

## 2020-06-22 LAB — MAGNESIUM
Magnesium: 2 mg/dL (ref 1.7–2.4)
Magnesium: 1.9 mg/dL (ref 1.7–2.4)

## 2020-06-22 LAB — D-DIMER, QUANTITATIVE
D-Dimer, Quant: 1.27 ug/mL-FEU — ABNORMAL HIGH (ref 0.00–0.50)
D-Dimer, Quant: 1.64 ug/mL-FEU — ABNORMAL HIGH (ref 0.00–0.50)

## 2020-06-22 LAB — BRAIN NATRIURETIC PEPTIDE: B Natriuretic Peptide: 269.5 pg/mL — ABNORMAL HIGH (ref 0.0–100.0)

## 2020-06-22 LAB — POTASSIUM: Potassium: 2.3 mmol/L — CL (ref 3.5–5.1)

## 2020-06-22 LAB — TSH: TSH: 4.334 u[IU]/mL (ref 0.350–4.500)

## 2020-06-22 MED ORDER — HEPARIN 1000 UNIT/ML FOR PERITONEAL DIALYSIS: 500.0000 [IU] | INTRAMUSCULAR | Status: DC | PRN

## 2020-06-22 MED ORDER — DELFLEX-LC/2.5% DEXTROSE 394 MOSM/L IP SOLN: INTRAPERITONEAL | Status: DC

## 2020-06-22 MED ORDER — GERHARDT'S BUTT CREAM
TOPICAL_CREAM | CUTANEOUS | Status: DC | PRN
  Filled 2020-06-22: qty 1

## 2020-06-22 MED ORDER — SODIUM CHLORIDE 0.9% IV SOLUTION: Freq: Once | INTRAVENOUS | Status: AC

## 2020-06-22 MED ORDER — DELFLEX-LC/4.25% DEXTROSE 483 MOSM/L IP SOLN: INTRAPERITONEAL | Status: DC

## 2020-06-22 MED ORDER — GENTAMICIN SULFATE 0.1 % EX CREA
1.0000 "application " | TOPICAL_CREAM | Freq: Every day | CUTANEOUS | Status: DC
  Administered 2020-06-24 – 2020-07-02 (×6): 1 via TOPICAL
  Filled 2020-06-22 (×2): qty 15

## 2020-06-22 MED ORDER — SODIUM CHLORIDE 0.9% IV SOLUTION: Freq: Once | INTRAVENOUS | Status: DC

## 2020-06-22 MED ORDER — DIPHENHYDRAMINE HCL 50 MG/ML IJ SOLN: 25.0000 mg | Freq: Three times a day (TID) | INTRAMUSCULAR | Status: AC | PRN

## 2020-06-22 MED ORDER — HEPARIN 1000 UNIT/ML FOR PERITONEAL DIALYSIS
INTRAPERITONEAL | Status: DC | PRN
  Filled 2020-06-22 (×4): qty 5000

## 2020-06-22 NOTE — Progress Notes (Signed)
PROGRESS NOTE                                                                                                                                                                                                             Patient Demographics:    Amy [name], is a 70 y.o. female, DOB - 02-Oct-1950, HMC:[id number]  Outpatient Primary MD for the patient is [name], MD    LOS - 3  Admit date - 06/19/2020    Chief Complaint  Patient presents with  . Code Stroke       Brief Narrative (HPI from H&P)  CRAIG WISNEWSKI is a 70 y.o. female with medical history significant of ESRD on PD, HTN, multiple thyroid nodules. She had a hip fracture in December s/p ORIF. Recently hospitalized 1/25/-05/29/20 for watery diarrhea, hyponatremia, hypokalemia. She was found to be covid 19 positive but was not treated with no signs of respiratory involvement. She did test positive for C. Diff by PCR and was started on oral Vancomycin with a 9 day Rx at discharge which she was unable to get filled. By her report she was treated with clindamycin. She has continued to have copious watery diarrhea with increased weakness. She was observed to have a very brief episode of rightward gaze with slumped head that lasted approximately 1 min. EMS was activated and transported the patient to [facility] for evaluation.  Her work-up was consistent with severe dehydration with severe hypokalemia, hypomagnesemia, possible seizures and she was admitted.    Subjective:   Patient in bed, appears comfortable, denies any headache, no fever, no chest pain or pressure, no shortness of breath , no abdominal pain. No focal weakness.   Assessment  & Plan :     1. Seizure with postictal state caused by severe electrolyte abnormalities including hyponatremia, hypomagnesemia, hypokalemia caused by C. difficile diarrhea - MRI negative, EEG shows postictal state, seen by neurology and  nephrology.  Potassium and magnesium now stable after aggressive replacement, has been hydrated with IV fluids, on IV Keppra and currently seems to be stabilizing.  PT OT to monitor.  2.  C. difficile infection present on admission.  Being treated with Dificid as she seems to have failed 10-day course of vancomycin.  3.  Recent COVID-19 incidental infection.  Supportive care.  4.  ESRD.  On  PD.  Renal following.  To be resumed, oral bicarb continued for metabolic acidosis Case discussed with nephrologist Dr. Osborne Casco on 06/22/2020.  5.  Anemia of chronic disease.  Anemia panel suggestive of chronic disease, no signs of active bleeding, 1 unit of packed RBC transfusion on 06/22/2020 we will continue to monitor.       Condition - Guarded  Family Communication  : Wayna Chalet [phone]  on 06/20/2020 at 11:56 AM message left, updated on 06/21/2020, 06/22/20  Code Status : Full  Consults  :  Neuro, Renal  PUD Prophylaxis : None   Procedures  :     MRI Brain - Non acute  EEG - Post ictial state      Disposition Plan  :    Status is: Inpatient  Remains inpatient appropriate because:IV treatments appropriate due to intensity of illness or inability to take PO   Dispo: The patient is from: Home              Anticipated d/c is to: Home              Anticipated d/c date is: 3 days              Patient currently is not medically stable to d/c.   Difficult to place patient No   DVT Prophylaxis  :   Heparin   Lab Results  Component Value Date   PLT 330 06/22/2020    Diet :  Diet Order            Diet renal with fluid restriction Fluid restriction: 1200 mL Fluid; Room service appropriate? Yes; Fluid consistency: Thin  Diet effective now                  Inpatient Medications  Scheduled Meds: . sodium chloride   Intravenous Once  . calcitRIOL  0.5 mcg Oral Daily  . Chlorhexidine Gluconate Cloth  6 each Topical Daily  . cinacalcet  30 mg Oral Q breakfast  .  darbepoetin (ARANESP) injection - NON-DIALYSIS  60 mcg Subcutaneous Q Sat-1800  . fidaxomicin  200 mg Oral BID  . gentamicin cream  1 application Topical Daily  . heparin  5,000 Units Subcutaneous Q8H  . sodium bicarbonate  650 mg Oral TID  . sodium chloride flush  3 mL Intravenous Once   Continuous Infusions: . dialysis solution 2.5% low-MG/low-CA    . levETIRAcetam Stopped (06/22/20 0629)   PRN Meds:.acetaminophen **OR** [DISCONTINUED] acetaminophen, diphenhydrAMINE, dianeal solution for CAPD/[name] with heparin, traZODone  Antibiotics  :    Anti-infectives (From admission, onward)   Start     Dose/Rate Route Frequency Ordered Stop   06/19/20 2200  fidaxomicin (DIFICID) tablet 200 mg        200 mg Oral 2 times daily 06/19/20 2005 06/29/20 2159       Time Spent in minutes  30   Lala Lund M.D on 06/22/2020 at 11:17 AM  To page go to [email or web address]   [facility] -  Office  [phone]    See all Orders from today for further details    Objective:   Vitals:   06/22/20 0412 06/22/20 0543 06/22/20 0800 06/22/20 0839  BP: (!) 179/86 (!) 142/85  (!) 143/84  Pulse:  97 (!) 102 97  Resp: 16 12 14    Temp: 98.5 F (36.9 C) 98.5 F (36.9 C)  97.8 F (36.6 C)  TempSrc: Oral Oral  Oral  SpO2: 100% 99%  99%  Weight:  Height:        Wt Readings from Last 3 Encounters:  06/20/20 52.6 kg  05/31/20 50.8 kg  04/26/20 53.5 kg     Intake/Output Summary (Last 24 hours) at 06/22/2020 1117 Last data filed at 06/22/2020 0730 Gross per 24 hour  Intake 1020.33 ml  Output --  Net 1020.33 ml     Physical Exam  Awake Alert, No new F.N deficits, Normal affect [place].AT,PERRAL Supple Neck,No JVD, No cervical lymphadenopathy appriciated.  Symmetrical Chest wall movement, Good air movement bilaterally, CTAB RRR,No Gallops, Rubs or new Murmurs, No Parasternal Heave +ve B.Sounds, Abd Soft, No tenderness, No organomegaly appriciated, No rebound - guarding or rigidity. PD  site stable. No Cyanosis, 1+ edema    Data Review:    CBC Recent Labs  Lab 06/19/20 1613 06/19/20 1617 06/20/20 0313 06/21/20 0623 06/22/20 0139 06/22/20 0704  WBC 16.7*  --  13.9* 11.5* 10.9* 11.4*  HGB 9.0* 9.2* 8.0* 7.4* 6.7* 9.3*  HCT 25.6* 27.0* 22.9* 21.3* 19.1* 25.5*  PLT 443*  --  345 348 345 330  MCV 87.7  --  86.1 88.4 87.2 84.7  MCH 30.8  --  30.1 30.7 30.6 30.9  MCHC 35.2  --  34.9 34.7 35.1 36.5*  RDW 16.5*  --  16.7* 18.2* 18.6* 17.1*  LYMPHSABS 1.1  --   --  1.1 1.2  --   MONOABS 1.0  --   --  0.7 0.7  --   EOSABS 0.0  --   --  0.2 0.2  --   BASOSABS 0.0  --   --  0.0 0.0  --     Recent Labs  Lab 06/19/20 1613 06/19/20 1617 06/20/20 0313 06/20/20 1205 06/20/20 1805 06/21/20 0500 06/21/20 0623 06/21/20 1151 06/22/20 0139  NA 123*   < > 124*  --  125* 127* 127* 126* 126*  K 2.1*   < > 2.1*   < > 2.7* 4.0 4.0 4.6 4.6  CL 86*   < > 92*  --  93* 96* 97* 95* 96*  CO2 15*  --  17*  --  17* 16* 15* 16* 19*  GLUCOSE 158*   < > 78  --  78 69* 74 81 90  BUN 36*   < > 35*  --  34* 33* 33* 34* 38*  CREATININE 5.01*   < > 4.60*  --  4.74* 4.81* 4.88* 4.97* 5.26*  CALCIUM 7.3*  --  6.9*  --  7.3* 7.6* 7.7* 8.0* 7.8*  AST 38  --   --   --   --  36 35  --  29  ALT 27  --   --   --   --  20 20  --  20  ALKPHOS 86  --   --   --   --  93 84  --  82  BILITOT 0.5  --   --   --   --  0.8 0.6  --  0.5  ALBUMIN 2.0*  --   --   --   --  1.8* 1.8*  --  1.7*  MG  --   --  1.4*  --   --  2.1  --   --  2.0  CRP  --   --   --   --   --  3.6*  --   --  3.0*  DDIMER  --   --   --   --   --   --  1.31*  --  1.27*  INR 1.1  --   --   --   --   --   --   --   --   BNP  --   --   --   --   --   --  276.9*  --  269.5*   < > = values in this interval not displayed.    ------------------------------------------------------------------------------------------------------------------ No results for input(s): CHOL, HDL, LDLCALC, TRIG, CHOLHDL, LDLDIRECT in the last 72 hours.  No  results found for: HGBA1C ------------------------------------------------------------------------------------------------------------------ No results for input(s): TSH, T4TOTAL, T3FREE, THYROIDAB in the last 72 hours.  Invalid input(s): FREET3  Cardiac Enzymes No results for input(s): CKMB, TROPONINI, MYOGLOBIN in the last 168 hours.  Invalid input(s): CK ------------------------------------------------------------------------------------------------------------------    Component Value Date/Time   BNP 269.5 (H) 06/22/2020 0139    Micro Results Recent Results (from the past 240 hour(s))  MRSA PCR Screening     Status: None   Collection Time: 06/21/20 12:34 AM   Specimen: Nasopharyngeal  Result Value Ref Range Status   MRSA by PCR NEGATIVE NEGATIVE Final    Comment:        The GeneXpert MRSA Assay (FDA approved for NASAL specimens only), is one component of a comprehensive MRSA colonization surveillance program. It is not intended to diagnose MRSA infection nor to guide or monitor treatment for MRSA infections. Performed at [facility], [place] [address] [place], [place] [address]     Radiology Reports MR BRAIN WO CONTRAST  Result Date: 06/20/2020 CLINICAL DATA:  Left-sided neglect and confusion.  Seizure activity. EXAM: MRI HEAD WITHOUT CONTRAST TECHNIQUE: Multiplanar, multiecho pulse sequences of the brain and surrounding structures were obtained without intravenous contrast. COMPARISON:  Head CT 06/19/2020 FINDINGS: Brain: There is no evidence of an acute infarct, intracranial hemorrhage, mass, midline shift, or extra-axial fluid collection. T2 hyperintensities in the cerebral white matter bilaterally are nonspecific but compatible with mild chronic small vessel ischemic disease. Chronic lacunar infarcts are noted in the right caudate nucleus and posterior right corona radiata. A chronic lacunar infarct is also noted in the right pons. There is moderate generalized  cerebral atrophy. Vascular: Major intracranial vascular flow voids are preserved. Skull and upper cervical spine: Unremarkable bone marrow signal. Moderate to severe disc degeneration at C4-5 and C5-6 and severe left facet arthrosis at C4-5 with grade 1 anterolisthesis. Sinuses/Orbits: Mild bilateral ethmoid air cell mucosal thickening. Clear mastoid air cells. Unremarkable orbits. Other: None. IMPRESSION: 1. No acute intracranial abnormality. 2. Chronic small-vessel ischemia with multiple chronic lacunar infarcts. Electronically Signed   By: Logan Bores M.D.   On: 06/20/2020 10:09   DG Chest Portable 1 View  Result Date: 05/29/2020 CLINICAL DATA:  Weakness EXAM: PORTABLE CHEST 1 VIEW COMPARISON:  12/16/2019 and prior. FINDINGS: No pneumothorax or pleural effusion. Cardiomediastinal silhouette within normal limits. Hazy appearance of the peripheral lung fields. No acute osseous abnormality. IMPRESSION: Hazy appearance of the peripheral lung fields may be artifactual however cannot exclude atypical infection. Electronically Signed   By: Primitivo Gauze M.D.   On: 05/29/2020 16:41   EEG adult  Result Date: 06/19/2020 [name], MD     06/19/2020  7:42 PM Patient Name: ALBANY WINSLOW MRN: [id number] Epilepsy Attending: [name] Referring Physician/Provider: Anibal Henderson, NP Date: 06/19/2020 Duration: Patient history: 70yo F with sudden onset speech disturbance, right gaze deviation and seizure like episode. EEG to evaluate for seizure  Level of alertness: Awake AEDs  during EEG study: LEV Technical aspects: This EEG study was done with scalp electrodes positioned according to the 10-20 International system of electrode placement. Electrical activity was acquired at a sampling rate of 500Hz  and reviewed with a high frequency filter of 70Hz  and a low frequency filter of 1Hz . EEG data were recorded continuously and digitally stored. Description: No clear posterior dominant rhythm was seen.  EEG  showed continuous generalized and maximal bifrontal 5-9hz  theta-delta activity as well as intermittent 2-3hz  delta slowing. Hyperventilation and photic stimulation were not performed.   ABNORMALITY -Continuous slow, generalized and maximal bifrontal region IMPRESSION: This study is suggestive of cortical dysfunction in bifrontal region which is non specific but could be seen due to underlying structural abnormality, post-ictal state. There is also mild to moderate diffuse encephalopathy, nonspecific etiology. No seizures or epileptiform discharges were seen throughout the recording. [name]   CT HEAD CODE STROKE WO CONTRAST  Result Date: 06/19/2020 CLINICAL DATA:  Code stroke.  Acute stroke presentation. EXAM: CT HEAD WITHOUT CONTRAST TECHNIQUE: Contiguous axial images were obtained from the base of the skull through the vertex without intravenous contrast. COMPARISON:  12/16/2019 FINDINGS: Brain: Generalized atrophy. No focal infarction seen affecting the brainstem or cerebellum. Cerebral hemispheres show chronic small-vessel ischemic changes of the hemispheric white matter and the basal ganglia regions. No sign of acute infarction, mass lesion, hemorrhage hydrocephalus or extra-axial collection. Vascular: There is atherosclerotic calcification of the major vessels at the base of the brain. Skull: Negative Sinuses/Orbits: Clear/normal Other: None ASPECTS ([place] Stroke Program Early CT Score) - Ganglionic level infarction (caudate, lentiform nuclei, internal capsule, insula, M1-M3 cortex): 7 - Supraganglionic infarction (M4-M6 cortex): 3 Total score (0-10 with 10 being normal): 10 IMPRESSION: 1. No acute finding by CT. Atrophy and chronic small-vessel ischemic changes of the cerebral hemispheric white matter and basal ganglia regions. 2. ASPECTS is 10. 3. These results were communicated to Dr. Charlsie Merles At 4:25 pmon 2/17/2022by text page via the [facility] messaging system. Electronically Signed   By: Nelson Chimes M.D.   On: 06/19/2020 16:25   CT ANGIO HEAD CODE STROKE  Result Date: 06/19/2020 CLINICAL DATA:  Left-sided neglect. Seizure activity. Acute stroke suspected. EXAM: CT ANGIOGRAPHY HEAD AND NECK TECHNIQUE: Multidetector CT imaging of the head and neck was performed using the standard protocol during bolus administration of intravenous contrast. Multiplanar CT image reconstructions and MIPs were obtained to evaluate the vascular anatomy. Carotid stenosis measurements (when applicable) are obtained utilizing NASCET criteria, using the distal internal carotid diameter as the denominator. CONTRAST:  19mL OMNIPAQUE IOHEXOL 350 MG/ML SOLN COMPARISON:  Head CT earlier same day FINDINGS: CTA NECK FINDINGS Aortic arch: Mild atherosclerotic change of the aortic arch. Branching pattern is normal without origin stenosis. Right carotid system: Common carotid artery widely patent to the bifurcation. Carotid bifurcation shows minimal plaque but there is no stenosis. Cervical ICA widely patent. Left carotid system: Common carotid artery widely patent to the bifurcation. Mild plaque at the bifurcation and ICA bulb but no stenosis. Cervical ICA widely patent. Vertebral arteries: Large vertebral arteries widely patent at their origins and through the cervical region to the foramen magnum. Skeleton: Mid cervical spondylosis which could be painful. Other neck: No adenopathy. Enlarged heterogeneous thyroid gland. Largest nodule in the isthmus measures up to 2 cm in diameter. Upper chest: Mild pleural and parenchymal scarring at the lung apices. Review of the MIP images confirms the above findings CTA HEAD FINDINGS Anterior circulation: Both internal carotid arteries are patent  through the skull base and siphon regions. There is ordinary atherosclerotic calcification in the carotid siphon regions but without stenosis greater than 30% suspected. The anterior and middle cerebral vessels are patent. No large or medium vessel  occlusion identified. Distal branch vessels do show atherosclerotic irregularity. Posterior circulation: Both vertebral arteries widely patent through the foramen magnum to the basilar. There is focal plaque of the left vertebral artery V4 segment with stenosis estimated at 30-50%. No vertebral stenosis. Posterior circulation branch vessels are patent. Distal branch vessels do show atherosclerotic irregularity. Venous sinuses: Patent and normal. Anatomic variants: None significant. Review of the MIP images confirms the above findings IMPRESSION: 1. No large or medium vessel occlusion. Widespread intracranial distal vessel atherosclerotic irregularity. 2. Minimal atherosclerotic change at both carotid bifurcations but without stenosis. 3. 30-50% stenosis of the left vertebral artery V4 segment. 4. Enlarged heterogeneous thyroid gland. Largest nodule in the isthmus measures up to 2 cm in diameter. Recommend thyroid US (ref: J Am Coll Radiol. 2015 Feb;12(2): 143-50). Aortic Atherosclerosis (ICD10-I70.0). Electronically Signed   By: Nelson Chimes M.D.   On: 06/19/2020 17:08   CT ANGIO NECK CODE STROKE  Result Date: 06/19/2020 CLINICAL DATA:  Left-sided neglect. Seizure activity. Acute stroke suspected. EXAM: CT ANGIOGRAPHY HEAD AND NECK TECHNIQUE: Multidetector CT imaging of the head and neck was performed using the standard protocol during bolus administration of intravenous contrast. Multiplanar CT image reconstructions and MIPs were obtained to evaluate the vascular anatomy. Carotid stenosis measurements (when applicable) are obtained utilizing NASCET criteria, using the distal internal carotid diameter as the denominator. CONTRAST:  57mL OMNIPAQUE IOHEXOL 350 MG/ML SOLN COMPARISON:  Head CT earlier same day FINDINGS: CTA NECK FINDINGS Aortic arch: Mild atherosclerotic change of the aortic arch. Branching pattern is normal without origin stenosis. Right carotid system: Common carotid artery widely patent to the  bifurcation. Carotid bifurcation shows minimal plaque but there is no stenosis. Cervical ICA widely patent. Left carotid system: Common carotid artery widely patent to the bifurcation. Mild plaque at the bifurcation and ICA bulb but no stenosis. Cervical ICA widely patent. Vertebral arteries: Large vertebral arteries widely patent at their origins and through the cervical region to the foramen magnum. Skeleton: Mid cervical spondylosis which could be painful. Other neck: No adenopathy. Enlarged heterogeneous thyroid gland. Largest nodule in the isthmus measures up to 2 cm in diameter. Upper chest: Mild pleural and parenchymal scarring at the lung apices. Review of the MIP images confirms the above findings CTA HEAD FINDINGS Anterior circulation: Both internal carotid arteries are patent through the skull base and siphon regions. There is ordinary atherosclerotic calcification in the carotid siphon regions but without stenosis greater than 30% suspected. The anterior and middle cerebral vessels are patent. No large or medium vessel occlusion identified. Distal branch vessels do show atherosclerotic irregularity. Posterior circulation: Both vertebral arteries widely patent through the foramen magnum to the basilar. There is focal plaque of the left vertebral artery V4 segment with stenosis estimated at 30-50%. No vertebral stenosis. Posterior circulation branch vessels are patent. Distal branch vessels do show atherosclerotic irregularity. Venous sinuses: Patent and normal. Anatomic variants: None significant. Review of the MIP images confirms the above findings IMPRESSION: 1. No large or medium vessel occlusion. Widespread intracranial distal vessel atherosclerotic irregularity. 2. Minimal atherosclerotic change at both carotid bifurcations but without stenosis. 3. 30-50% stenosis of the left vertebral artery V4 segment. 4. Enlarged heterogeneous thyroid gland. Largest nodule in the isthmus measures up to 2 cm in  diameter. Recommend  thyroid US (ref: J Am Coll Radiol. 2015 Feb;12(2): 143-50). Aortic Atherosclerosis (ICD10-I70.0). Electronically Signed   By: Nelson Chimes M.D.   On: 06/19/2020 17:08

## 2020-06-22 NOTE — Progress Notes (Addendum)
Nephrology Follow-Up Consult note   Assessment/Recommendations: Amy [name] is a/an 70 y.o. female with a past medical history significant for ESRD on PD admitted with multiple electrolyte derangements, diarrhea, C. difficile, possible seizure.   Outpt prescription Access  PD Catheter Right lower quadrant   Schedule  QD   PD Type  PD with Cycler   Vendor  [id number]   Calcium  2.50 mEq   Magnesium  0.5 mEq   Dextrose  Varied   Dextrose Instructions  Dextrose strength dependant on BP, weight, and edema.   Total Fills: Dry (Checked)  4   Total Fills: Dry (Checked) Details  4 Fills per Cycle @ 2000 ml   Cycle  Day   Cycle Drain Time  20 min   Cycle Fill Time  10 min   Cycle Dwell Time  1 hrs 49 min      # ESRD: We will obtain outpatient PD prescription.  Electrolytes now improved.  Will start PD today. Appears volume overloaded today use half 2.5% bags and half 4.25%. # Volume/ hypertension:  Appears volume overloaded on exam.  Plans for PD as above # Anemia of Chronic Kidney Disease:  Hemoglobin 9.3.  On ESA # Secondary Hyperparathyroidism/Hyperphosphatemia: Continue calcitriol 0.5 MCG daily and Sensipar 30 mg daily. #Hyponatremia: Hyponatremia persistent.  Likely related to volume excess.  PD as above. #Hypokalemia: Resolved with repletion and correction of diarrhea #Acidosis: Mild metabolic acidosis likely related to diarrhea.    On oral bicarbonate #Possible seizure: Receiving Keppra.    Neurology has been consulted  # Additional recommendations: - Dose all meds for creatinine clearance <10 ml/min  - Unless absolutely necessary, no MRIs with gadolinium.  - Implement save arm precautions. Prefer needle sticks in the dorsum of the hands or wrists. No blood pressure measurements in arm. - If blood transfusion is requested during hemodialysis sessions, please alert [place] prior to the session.     Recommendations conveyed to primary service.    [facility] 06/22/2020 6:15 AM  ___________________________________________________________  CC: ESRD  Interval History/Subjective: Patient feels well today. Just bored. No complaints.   Medications:  Current Facility-Administered Medications  Medication Dose Route Frequency Provider Last Rate Last Admin  . acetaminophen (TYLENOL) tablet 650 mg  650 mg Oral Q6H PRN Norins, Heinz Knuckles, MD      . calcitRIOL (ROCALTROL) capsule 0.5 mcg  0.5 mcg Oral Daily Reesa Chew, MD   0.5 mcg at 06/21/20 1021  . Chlorhexidine Gluconate Cloth 2 % PADS 6 each  6 each Topical Daily Norins, Heinz Knuckles, MD   6 each at 06/21/20 1222  . cinacalcet (SENSIPAR) tablet 30 mg  30 mg Oral Q breakfast Reesa Chew, MD   30 mg at 06/21/20 1021  . Darbepoetin Alfa (ARANESP) injection 60 mcg  60 mcg Subcutaneous Q Sat-1800 Reesa Chew, MD      . fidaxomicin (DIFICID) tablet 200 mg  200 mg Oral BID Neena Rhymes, MD   200 mg at 06/21/20 2126  . heparin injection 5,000 Units  5,000 Units Subcutaneous Q8H Norins, Heinz Knuckles, MD   5,000 Units at 06/21/20 2126  . levETIRAcetam (KEPPRA) IVPB 500 mg/100 mL premix  500 mg Intravenous Q12H Perfecto Kingdom, MD 400 mL/hr at 06/22/20 0614 500 mg at 06/22/20 0614  . sodium bicarbonate tablet 650 mg  650 mg Oral TID Thurnell Lose, MD   650 mg at 06/21/20 2126  . sodium chloride flush (  NS) 0.9 % injection 3 mL  3 mL Intravenous Once Carmin Muskrat, MD      . traZODone (DESYREL) tablet 25 mg  25 mg Oral QHS PRN Norins, Heinz Knuckles, MD          Review of Systems: 10 systems reviewed and negative except per interval history/subjective  Physical Exam: Vitals:   06/22/20 0412 06/22/20 0543  BP: (!) 179/86 (!) 142/85  Pulse:  97  Resp: 16 12  Temp: 98.5 F (36.9 C) 98.5 F (36.9 C)  SpO2: 100% 99%   Total I/O In: 335.5 [I.V.:0.5; Blood:335] Out: -   Intake/Output Summary (Last 24 hours) at 06/22/2020 0615 Last data filed at 06/22/2020 0530 Gross per 24  hour  Intake 924.77 ml  Output --  Net 924.77 ml   Constitutional: Ill-appearing, lying in bed, no distress ENMT: ears and nose without scars or lesions, MMM CV: Tachycardia, 1+ pitting edema in dependent areas including arm Respiratory: Chest rise, normal work of breathing Gastrointestinal: soft, mild tenderness to palpation Skin: Scattered bruises mostly on left arm, thin skinning Psych: alert, judgement/insight appropriate, appropriate mood and affect   Test Results I personally reviewed new and old clinical labs and radiology tests Lab Results  Component Value Date   NA 126 (L) 06/22/2020   K 4.6 06/22/2020   CL 96 (L) 06/22/2020   CO2 19 (L) 06/22/2020   BUN 38 (H) 06/22/2020   CREATININE 5.26 (H) 06/22/2020   GLU 103 04/30/2014   CALCIUM 7.8 (L) 06/22/2020   ALBUMIN 1.7 (L) 06/22/2020   PHOS 6.1 (H) 11/08/2018

## 2020-06-22 NOTE — Plan of Care (Signed)
  Problem: Clinical Measurements: Goal: Ability to maintain clinical measurements within normal limits will improve Outcome: Progressing Goal: Will remain free from infection Outcome: Progressing Goal: Respiratory complications will improve Outcome: Progressing Goal: Cardiovascular complication will be avoided Outcome: Progressing   Problem: Nutrition: Goal: Adequate nutrition will be maintained Outcome: Progressing   Problem: Coping: Goal: Level of anxiety will decrease Outcome: Progressing   Problem: Elimination: Goal: Will not experience complications related to bowel motility Outcome: Progressing   Problem: Safety: Goal: Ability to remain free from injury will improve Outcome: Progressing   Problem: Skin Integrity: Goal: Risk for impaired skin integrity will decrease Outcome: Progressing

## 2020-06-22 NOTE — Progress Notes (Signed)
Date and time results received: 06/22/20 at 0155  Test: Hemoglobin Critical Value: 6.7  Name of Provider Notified: Clearence Ped, MD notified at [place]? Or Actions Taken?: Pending

## 2020-06-22 NOTE — Plan of Care (Signed)

## 2020-06-23 ENCOUNTER — Inpatient Hospital Stay (HOSPITAL_COMMUNITY): Payer: Medicare Other

## 2020-06-23 ENCOUNTER — Other Ambulatory Visit (HOSPITAL_COMMUNITY): Payer: Medicare Other

## 2020-06-23 DIAGNOSIS — N186 End stage renal disease: Secondary | ICD-10-CM

## 2020-06-23 DIAGNOSIS — I10 Essential (primary) hypertension: Secondary | ICD-10-CM | POA: Diagnosis not present

## 2020-06-23 DIAGNOSIS — E43 Unspecified severe protein-calorie malnutrition: Secondary | ICD-10-CM | POA: Insufficient documentation

## 2020-06-23 DIAGNOSIS — R7989 Other specified abnormal findings of blood chemistry: Secondary | ICD-10-CM

## 2020-06-23 DIAGNOSIS — Z992 Dependence on renal dialysis: Secondary | ICD-10-CM

## 2020-06-23 DIAGNOSIS — I248 Other forms of acute ischemic heart disease: Secondary | ICD-10-CM

## 2020-06-23 DIAGNOSIS — A0472 Enterocolitis due to Clostridium difficile, not specified as recurrent: Secondary | ICD-10-CM

## 2020-06-23 DIAGNOSIS — E871 Hypo-osmolality and hyponatremia: Secondary | ICD-10-CM | POA: Diagnosis not present

## 2020-06-23 DIAGNOSIS — I214 Non-ST elevation (NSTEMI) myocardial infarction: Secondary | ICD-10-CM

## 2020-06-23 LAB — COMPREHENSIVE METABOLIC PANEL
ALT: 23 U/L (ref 0–44)
AST: 33 U/L (ref 15–41)
Albumin: 1.9 g/dL — ABNORMAL LOW (ref 3.5–5.0)
Alkaline Phosphatase: 90 U/L (ref 38–126)
Anion gap: 12 (ref 5–15)
BUN: 36 mg/dL — ABNORMAL HIGH (ref 8–23)
CO2: 18 mmol/L — ABNORMAL LOW (ref 22–32)
Calcium: 8 mg/dL — ABNORMAL LOW (ref 8.9–10.3)
Chloride: 100 mmol/L (ref 98–111)
Creatinine, Ser: 5.04 mg/dL — ABNORMAL HIGH (ref 0.44–1.00)
GFR, Estimated: 9 mL/min — ABNORMAL LOW (ref >60)
Glucose, Bld: 193 mg/dL — ABNORMAL HIGH (ref 70–99)
Potassium: 4 mmol/L (ref 3.5–5.1)
Sodium: 130 mmol/L — ABNORMAL LOW (ref 135–145)
Total Bilirubin: 0.4 mg/dL (ref 0.3–1.2)
Total Protein: 4.1 g/dL — ABNORMAL LOW (ref 6.5–8.1)

## 2020-06-23 LAB — CBC WITH DIFFERENTIAL/PLATELET
Abs Immature Granulocytes: 0.06 10*3/uL (ref 0.00–0.07)
Basophils Absolute: 0 10*3/uL (ref 0.0–0.1)
Basophils Relative: 0 %
Eosinophils Absolute: 0.1 10*3/uL (ref 0.0–0.5)
Eosinophils Relative: 1 %
HCT: 27.9 % — ABNORMAL LOW (ref 36.0–46.0)
Hemoglobin: 9.9 g/dL — ABNORMAL LOW (ref 12.0–15.0)
Immature Granulocytes: 1 %
Lymphocytes Relative: 9 %
Lymphs Abs: 0.8 10*3/uL (ref 0.7–4.0)
MCH: 30.7 pg (ref 26.0–34.0)
MCHC: 35.5 g/dL (ref 30.0–36.0)
MCV: 86.6 fL (ref 80.0–100.0)
Monocytes Absolute: 0.5 10*3/uL (ref 0.1–1.0)
Monocytes Relative: 6 %
Neutro Abs: 7.8 10*3/uL — ABNORMAL HIGH (ref 1.7–7.7)
Neutrophils Relative %: 83 %
Platelets: 263 10*3/uL (ref 150–400)
RBC: 3.22 MIL/uL — ABNORMAL LOW (ref 3.87–5.11)
RDW: 17.8 % — ABNORMAL HIGH (ref 11.5–15.5)
WBC: 9.3 10*3/uL (ref 4.0–10.5)
nRBC: 0 % (ref 0.0–0.2)

## 2020-06-23 LAB — ECHOCARDIOGRAM COMPLETE
Calc EF: 25.4 %
Height: 64 in
S' Lateral: 4 cm
Single Plane A2C EF: 24.1 %
Single Plane A4C EF: 23.7 %
Weight: 2123.47 oz

## 2020-06-23 LAB — C-REACTIVE PROTEIN: CRP: 2.1 mg/dL — ABNORMAL HIGH (ref <1.0)

## 2020-06-23 LAB — MAGNESIUM: Magnesium: 1.9 mg/dL (ref 1.7–2.4)

## 2020-06-23 LAB — TROPONIN I (HIGH SENSITIVITY)
Troponin I (High Sensitivity): 1978 ng/L (ref <18)
Troponin I (High Sensitivity): 1554 ng/L (ref <18)
Troponin I (High Sensitivity): 1397 ng/L (ref <18)

## 2020-06-23 LAB — D-DIMER, QUANTITATIVE: D-Dimer, Quant: 1.98 ug/mL-FEU — ABNORMAL HIGH (ref 0.00–0.50)

## 2020-06-23 LAB — LACTIC ACID, PLASMA
Lactic Acid, Venous: 4.2 mmol/L (ref 0.5–1.9)
Lactic Acid, Venous: 3 mmol/L (ref 0.5–1.9)

## 2020-06-23 LAB — BRAIN NATRIURETIC PEPTIDE: B Natriuretic Peptide: 1142.5 pg/mL — ABNORMAL HIGH (ref 0.0–100.0)

## 2020-06-23 MED ORDER — METOPROLOL TARTRATE 25 MG PO TABS
25.0000 mg | ORAL_TABLET | Freq: Two times a day (BID) | ORAL | Status: DC
  Administered 2020-06-23 – 2020-06-26 (×5): 25 mg via ORAL
  Filled 2020-06-23 (×8): qty 1

## 2020-06-23 MED ORDER — BUTALBITAL-APAP-CAFFEINE 50-325-40 MG PO TABS
1.0000 | ORAL_TABLET | Freq: Three times a day (TID) | ORAL | Status: DC | PRN
  Administered 2020-06-23: 2 via ORAL
  Administered 2020-06-24: 1 via ORAL
  Filled 2020-06-23: qty 1
  Filled 2020-06-23: qty 2

## 2020-06-23 MED ORDER — RENA-VITE PO TABS
1.0000 | ORAL_TABLET | Freq: Every day | ORAL | Status: DC
  Administered 2020-06-23 – 2020-07-03 (×11): 1 via ORAL
  Filled 2020-06-23 (×11): qty 1

## 2020-06-23 MED ORDER — NEPRO/CARBSTEADY PO LIQD
237.0000 mL | Freq: Two times a day (BID) | ORAL | Status: DC
  Administered 2020-06-23 – 2020-06-26 (×6): 237 mL via ORAL

## 2020-06-23 MED ORDER — IOHEXOL 350 MG/ML SOLN
75.0000 mL | Freq: Once | INTRAVENOUS | Status: AC | PRN
  Administered 2020-06-23: 75 mL via INTRAVENOUS

## 2020-06-23 MED ORDER — ASPIRIN 81 MG PO CHEW
CHEWABLE_TABLET | ORAL | Status: AC
  Administered 2020-06-23: 81 mg
  Filled 2020-06-23: qty 1

## 2020-06-23 MED ORDER — ASPIRIN EC 81 MG PO TBEC
81.0000 mg | DELAYED_RELEASE_TABLET | Freq: Every day | ORAL | Status: DC
Start: 2020-06-24 — End: 2020-06-25
  Administered 2020-06-24: 81 mg via ORAL
  Filled 2020-06-23 (×2): qty 1

## 2020-06-23 MED ORDER — ASPIRIN 81 MG PO CHEW: 81.0000 mg | CHEWABLE_TABLET | Freq: Once | ORAL | Status: AC

## 2020-06-23 MED ORDER — GENTAMICIN SULFATE 0.1 % EX CREA: 1.0000 "application " | TOPICAL_CREAM | Freq: Every day | CUTANEOUS | Status: DC

## 2020-06-23 MED ORDER — SODIUM CHLORIDE 0.9 % IV BOLUS
250.0000 mL | Freq: Once | INTRAVENOUS | Status: AC
  Administered 2020-06-23: 250 mL via INTRAVENOUS

## 2020-06-23 NOTE — Progress Notes (Signed)
  Echocardiogram 2D Echocardiogram has been performed.  [name] 06/23/2020, 4:11 PM

## 2020-06-23 NOTE — Plan of Care (Signed)

## 2020-06-23 NOTE — Progress Notes (Signed)
Critical lab latic Acid - of 4.2 called to Dr. Harrell Lark office, MD notified, awaiting for additional orders. Completed at 8:59 AM

## 2020-06-23 NOTE — Progress Notes (Signed)
Pt had 18 beats of vtach. Notified Dr. Hal Hope.

## 2020-06-23 NOTE — Progress Notes (Signed)
Lower extremity venous study attempted twice at approximately 3:45 pm and 4:30 pm. Will attempt again as patient availability and schedule permits.   Darlin Coco, RDMS

## 2020-06-23 NOTE — Progress Notes (Signed)
Nephrology Follow-Up Consult note   Assessment/Recommendations: Amy [name] is a/an 70 y.o. female with a past medical history significant for ESRD on PD admitted with multiple electrolyte derangements, diarrhea, C. difficile, possible seizure.   # ESRD:  - PD started back 2/20 - all 1.5% tonight  # Volume/ hypertension:   - noted 2.5 and 4.25 mix per last orders - resume 1.5% per prior regimen   # Anemia of Chronic Kidney Disease:  improved. On ESA - aranesp 60 mcg every sat  # Secondary Hyperparathyroidism/Hyperphosphatemia: Continue calcitriol 0.5 MCG daily and Sensipar 30 mg daily.  #Hyponatremia: Hyponatremia persistent.  Likely related to volume excess.  PD as above.  #Hypokalemia: Resolved with repletion and correction of diarrhea  #Acidosis: Mild metabolic acidosis likely related to diarrhea.    On oral bicarbonate  #Possible seizure: Receiving Keppra.    Neurology has been consulted  # Elevated troponin - per primary team.  Transition back to standard gentle UF per pt's normal regimen  ________________________________________________________  CC: ESRD  Interval History/Subjective: PD overnight.  No urine output has been charted  Noted elevated troponin o/n - pt had a mix of 2.5 and 4.25 % last night - she usually uses all 1.5's and will sometimes use a 2.5.  She's never used a 4.25 before now.  Review of systems:   Denies shortness of breath or chest pain  Denies n/v  Medications:  Current Facility-Administered Medications  Medication Dose Route Frequency Provider Last Rate Last Admin  . 0.9 %  sodium chloride infusion (Manually program via Guardrails IV Fluids)   Intravenous Once Thurnell Lose, MD   Held at 06/22/20 [phone]  . acetaminophen (TYLENOL) tablet 650 mg  650 mg Oral Q6H PRN Neena Rhymes, MD      . Derrill Memo ON 06/24/2020] aspirin EC tablet 81 mg  81 mg Oral Daily Rise Patience, MD      . calcitRIOL (ROCALTROL) capsule 0.5 mcg  0.5 mcg Oral  Daily Reesa Chew, MD   0.5 mcg at 06/22/20 2951  . Chlorhexidine Gluconate Cloth 2 % PADS 6 each  6 each Topical Daily Norins, Heinz Knuckles, MD   6 each at 06/22/20 1503  . cinacalcet (SENSIPAR) tablet 30 mg  30 mg Oral Q breakfast Reesa Chew, MD   30 mg at 06/22/20 8841  . Darbepoetin Alfa (ARANESP) injection 60 mcg  60 mcg Subcutaneous Q Sat-1800 Reesa Chew, MD      . dialysis solution 2.5% low-MG/low-CA dianeal solution   Intraperitoneal Q24H Reesa Chew, MD      . dialysis solution 4.25% low-MG/low-CA dianeal solution   Intraperitoneal Q24H Reesa Chew, MD      . diphenhydrAMINE (BENADRYL) injection 25 mg  25 mg Intravenous Q8H PRN Thurnell Lose, MD      . fidaxomicin (DIFICID) tablet 200 mg  200 mg Oral BID Neena Rhymes, MD   200 mg at 06/22/20 2020  . gentamicin cream (GARAMYCIN) 0.1 % 1 application  1 application Topical Daily Reesa Chew, MD      . Gerhardt's butt cream   Topical PRN Thurnell Lose, MD   Given at 06/22/20 1616  . heparin 2,500 Units in dialysis solution 2.5% low-MG/low-CA 5,000 mL dialysis solution   Peritoneal Dialysis PRN Reesa Chew, MD      . heparin injection 5,000 Units  5,000 Units Subcutaneous Q8H Norins, Heinz Knuckles, MD   5,000 Units at 06/23/20 0400  .  levETIRAcetam (KEPPRA) IVPB 500 mg/100 mL premix  500 mg Intravenous Q12H Perfecto Kingdom, MD 400 mL/hr at 06/23/20 0359 500 mg at 06/23/20 0359  . sodium bicarbonate tablet 650 mg  650 mg Oral TID Thurnell Lose, MD   650 mg at 06/22/20 2020  . sodium chloride flush (NS) 0.9 % injection 3 mL  3 mL Intravenous Once Carmin Muskrat, MD      . traZODone (DESYREL) tablet 25 mg  25 mg Oral QHS PRN Neena Rhymes, MD   25 mg at 06/22/20 2020      Physical Exam: Vitals:   06/23/20 0223 06/23/20 0436  BP: 113/70 112/73  Pulse: (!) 113 (!) 123  Resp: 17 16  Temp: 98 F (36.7 C) 98.2 F (36.8 C)  SpO2: 98% 99%   Total I/O In: 120 [P.O.:120] Out: -    Intake/Output Summary (Last 24 hours) at 06/23/2020 0640 Last data filed at 06/22/2020 2200 Gross per 24 hour  Intake 241.22 ml  Output -  Net 241.22 ml    General adult female in bed in no acute distress HEENT normocephalic atraumatic extraocular movements intact sclera anicteric Neck supple trachea midline Lungs clear to auscultation bilaterally normal work of breathing at rest  Heart S1s2 no rub Abdomen soft nontender nondistended Extremities 1+ edema  Psych normal mood and affect Neuro alert and oriented x 3 provides hx and follows commands   Test Results I personally reviewed new and old clinical labs and radiology tests Lab Results  Component Value Date   NA 130 (L) 06/23/2020   K 4.0 06/23/2020   CL 100 06/23/2020   CO2 18 (L) 06/23/2020   BUN 36 (H) 06/23/2020   CREATININE 5.04 (H) 06/23/2020   GLU 103 04/30/2014   CALCIUM 8.0 (L) 06/23/2020   ALBUMIN 1.9 (L) 06/23/2020   PHOS 6.1 (H) 11/08/2018     Outpt prescription Access  PD Catheter Right lower quadrant   Schedule  QD   PD Type  PD with Cycler   Vendor  [id number]   Calcium  2.50 mEq   Magnesium  0.5 mEq   Dextrose  Varied   Dextrose Instructions  Dextrose strength dependant on BP, weight, and edema.   Total Fills: Dry (Checked)  4   Total Fills: Dry (Checked) Details  4 Fills per Cycle @ 2000 ml   Cycle  Day   Cycle Drain Time  20 min   Cycle Fill Time  10 min   Cycle Dwell Time  1 hrs 49 min        Claudia Desanctis, MD 06/23/2020 6:52 AM

## 2020-06-23 NOTE — Progress Notes (Addendum)
PROGRESS NOTE                                                                                                                                                                                                             Patient Demographics:    Amy [name], is a 70 y.o. female, DOB - 16-Oct-1950, GBT:[id number]  Outpatient Primary MD for the patient is [name], MD    LOS - 4  Admit date - 06/19/2020    Chief Complaint  Patient presents with  . Code Stroke       Brief Narrative (HPI from H&P)  Amy [name] is a 70 y.o. female with medical history significant of ESRD on PD, HTN, multiple thyroid nodules. She had a hip fracture in December s/p ORIF. Recently hospitalized 1/25/-05/29/20 for watery diarrhea, hyponatremia, hypokalemia. She was found to be covid 19 positive but was not treated with no signs of respiratory involvement. She did test positive for C. Diff by PCR and was started on oral Vancomycin with a 9 day Rx at discharge which she was unable to get filled. By her report she was treated with clindamycin. She has continued to have copious watery diarrhea with increased weakness. She was observed to have a very brief episode of rightward gaze with slumped head that lasted approximately 1 min. EMS was activated and transported the patient to [facility] for evaluation.  Her work-up was consistent with severe dehydration with severe hypokalemia, hypomagnesemia, possible seizures and she was admitted.    Subjective:   Patient in bed, appears comfortable, denies any headache, no fever, no chest pain or pressure, no shortness of breath , no abdominal pain. No focal weakness.    Assessment  & Plan :     1. Seizure with postictal state caused by severe electrolyte abnormalities including hyponatremia, hypomagnesemia, hypokalemia caused by C. difficile diarrhea - MRI negative, EEG shows postictal state, seen by neurology and  nephrology.  Potassium and magnesium now stable after aggressive replacement, has been hydrated with IV fluids, on IV Keppra and currently seems to be stabilizing.  PT OT to monitor.  2.  C. difficile infection present on admission.  Being treated with Dificid as she seems to have failed 10-day course of vancomycin.  3.  Recent COVID-19 incidental infection.  Supportive care.  4.  ESRD (60yrs) .  On PD.  Renal following.  To be resumed, oral bicarb continued for metabolic acidosis Case discussed with nephrologist Dr. Osborne Casco on 06/22/2020.  5.  Anemia of chronic disease.  Anemia panel suggestive of chronic disease, no signs of active bleeding, 1 unit of packed RBC transfusion on 06/22/2020 we will continue to monitor.  6.  Episode of sinus tachycardia night of 06/22/2020, symptom-free, EKG nonacute, TSH stable, night team ordered echocardiogram, CTA which is pending, will also check leg ultrasound as D-dimer is slightly elevated.  Mild elevation of non-ACS pattern troponin likely due to demand ischemia from rapid rate in the setting of of ESRD, cardiology to follow for now continue low-dose beta-blocker along with aspirin.          Condition - Guarded  Family Communication  : Wayna Chalet [phone]  on 06/20/2020 at 11:56 AM message left, updated on 06/21/2020, 06/22/20, 06/23/20.  Code Status : Full  Consults  :  Neuro, Renal  PUD Prophylaxis : None   Procedures  :     MRI Brain - Non acute  EEG - Post ictial state  TEE  CTA  Leg [place]      Disposition Plan  :    Status is: Inpatient  Remains inpatient appropriate because:IV treatments appropriate due to intensity of illness or inability to take PO   Dispo: The patient is from: Home              Anticipated d/c is to: Home              Anticipated d/c date is: 3 days              Patient currently is not medically stable to d/c.   Difficult to place patient No   DVT Prophylaxis  :   Heparin   Lab Results  Component  Value Date   PLT 263 06/23/2020    Diet :  Diet Order            Diet renal with fluid restriction Fluid restriction: 1200 mL Fluid; Room service appropriate? Yes; Fluid consistency: Thin  Diet effective now                  Inpatient Medications  Scheduled Meds: . sodium chloride   Intravenous Once  . [START ON 06/24/2020] aspirin EC  81 mg Oral Daily  . calcitRIOL  0.5 mcg Oral Daily  . Chlorhexidine Gluconate Cloth  6 each Topical Daily  . cinacalcet  30 mg Oral Q breakfast  . darbepoetin (ARANESP) injection - NON-DIALYSIS  60 mcg Subcutaneous Q Sat-1800  . fidaxomicin  200 mg Oral BID  . gentamicin cream  1 application Topical Daily  . heparin  5,000 Units Subcutaneous Q8H  . metoprolol tartrate  25 mg Oral BID  . sodium bicarbonate  650 mg Oral TID  . sodium chloride flush  3 mL Intravenous Once   Continuous Infusions: . levETIRAcetam 500 mg (06/23/20 0359)   PRN Meds:.acetaminophen **OR** [DISCONTINUED] acetaminophen, Gerhardt's butt cream, dianeal solution for CAPD/[name] with heparin, traZODone  Antibiotics  :    Anti-infectives (From admission, onward)   Start     Dose/Rate Route Frequency Ordered Stop   06/19/20 2200  fidaxomicin (DIFICID) tablet 200 mg        200 mg Oral 2 times daily 06/19/20 2005 06/29/20 2159       Time Spent in minutes  30   Lala Lund M.D on 06/23/2020 at 10:51 AM  To  page go to [email or web address]   [facility] -  Office  [phone]    See all Orders from today for further details    Objective:   Vitals:   06/23/20 0858 06/23/20 0900 06/23/20 0917 06/23/20 0930  BP: 104/74 98/72 104/74 111/77  Pulse: (!) 134 (!) 134 (!) 121 (!) 119  Resp: 19 15  (!) 22  Temp: 98.3 F (36.8 C) 98.3 F (36.8 C)  98.6 F (37 C)  TempSrc: Oral Oral  Oral  SpO2:    98%  Weight:      Height:        Wt Readings from Last 3 Encounters:  06/23/20 60.2 kg  05/31/20 50.8 kg  04/26/20 53.5 kg     Intake/Output Summary (Last 24  hours) at 06/23/2020 1051 Last data filed at 06/22/2020 2200 Gross per 24 hour  Intake 241.22 ml  Output -  Net 241.22 ml     Physical Exam  Awake Alert, No new F.N deficits, Normal affect [place].AT,PERRAL Supple Neck,No JVD, No cervical lymphadenopathy appriciated.  Symmetrical Chest wall movement, Good air movement bilaterally, CTAB RRR,No Gallops, Rubs or new Murmurs, No Parasternal Heave +ve B.Sounds, Abd Soft, No tenderness, No organomegaly appriciated, No rebound - guarding or rigidity. No Cyanosis, trace edema, stable PD site     Data Review:    CBC Recent Labs  Lab 06/19/20 1613 06/19/20 1617 06/21/20 0623 06/22/20 0139 06/22/20 0704 06/22/20 1131 06/23/20 0057  WBC 16.7*   < > 11.5* 10.9* 11.4* 14.1* 9.3  HGB 9.0*   < > 7.4* 6.7* 9.3* 10.1* 9.9*  HCT 25.6*   < > 21.3* 19.1* 25.5* 28.0* 27.9*  PLT 443*   < > 348 345 330 307 263  MCV 87.7   < > 88.4 87.2 84.7 84.8 86.6  MCH 30.8   < > 30.7 30.6 30.9 30.6 30.7  MCHC 35.2   < > 34.7 35.1 36.5* 36.1* 35.5  RDW 16.5*   < > 18.2* 18.6* 17.1* 17.2* 17.8*  LYMPHSABS 1.1  --  1.1 1.2  --   --  0.8  MONOABS 1.0  --  0.7 0.7  --   --  0.5  EOSABS 0.0  --  0.2 0.2  --   --  0.1  BASOSABS 0.0  --  0.0 0.0  --   --  0.0   < > = values in this interval not displayed.    Recent Labs  Lab 06/19/20 1613 06/19/20 1617 06/20/20 0313 06/20/20 1205 06/21/20 0500 06/21/20 5621 06/21/20 1151 06/22/20 0139 06/22/20 2216 06/23/20 0057 06/23/20 0652 06/23/20 0930  NA 123*   < > 124*   < > 127* 127* 126* 126*  --  130*  --   --   K 2.1*   < > 2.1*   < > 4.0 4.0 4.6 4.6  --  4.0  --   --   CL 86*   < > 92*   < > 96* 97* 95* 96*  --  100  --   --   CO2 15*  --  17*   < > 16* 15* 16* 19*  --  18*  --   --   GLUCOSE 158*   < > 78   < > 69* 74 81 90  --  193*  --   --   BUN 36*   < > 35*   < > 33* 33* 34* 38*  --  36*  --   --  CREATININE 5.01*   < > 4.60*   < > 4.81* 4.88* 4.97* 5.26*  --  5.04*  --   --   CALCIUM 7.3*  --   6.9*   < > 7.6* 7.7* 8.0* 7.8*  --  8.0*  --   --   AST 38  --   --   --  36 35  --  29  --  33  --   --   ALT 27  --   --   --  20 20  --  20  --  23  --   --   ALKPHOS 86  --   --   --  93 84  --  82  --  90  --   --   BILITOT 0.5  --   --   --  0.8 0.6  --  0.5  --  0.4  --   --   ALBUMIN 2.0*  --   --   --  1.8* 1.8*  --  1.7*  --  1.9*  --   --   MG  --   --  1.4*  --  2.1  --   --  2.0 1.9 1.9  --   --   CRP  --   --   --   --  3.6*  --   --  3.0*  --  2.1*  --   --   DDIMER  --   --   --   --   --  1.31*  --  1.27* 1.64* 1.98*  --   --   LATICACIDVEN  --   --   --   --   --   --   --   --   --   --  4.2* 3.0*  INR 1.1  --   --   --   --   --   --   --   --   --   --   --   TSH  --   --   --   --   --   --   --   --  4.334  --   --   --   BNP  --   --   --   --   --  276.9*  --  269.5*  --  1,142.5*  --   --    < > = values in this interval not displayed.    ------------------------------------------------------------------------------------------------------------------ No results for input(s): CHOL, HDL, LDLCALC, TRIG, CHOLHDL, LDLDIRECT in the last 72 hours.  No results found for: HGBA1C ------------------------------------------------------------------------------------------------------------------ Recent Labs    06/22/20 2216  TSH 4.334    Cardiac Enzymes No results for input(s): CKMB, TROPONINI, MYOGLOBIN in the last 168 hours.  Invalid input(s): CK ------------------------------------------------------------------------------------------------------------------    Component Value Date/Time   BNP 1,142.5 (H) 06/23/2020 0057    Micro Results Recent Results (from the past 240 hour(s))  MRSA PCR Screening     Status: None   Collection Time: 06/21/20 12:34 AM   Specimen: Nasopharyngeal  Result Value Ref Range Status   MRSA by PCR NEGATIVE NEGATIVE Final    Comment:        The GeneXpert MRSA Assay (FDA approved for NASAL specimens only), is one component of  a comprehensive MRSA colonization surveillance program. It is not intended to diagnose MRSA infection nor to guide or monitor treatment for MRSA infections. Performed at [facility]  [facility], [place] [address] [place], [place] [address]     Radiology Reports MR BRAIN WO CONTRAST  Result Date: 06/20/2020 CLINICAL DATA:  Left-sided neglect and confusion.  Seizure activity. EXAM: MRI HEAD WITHOUT CONTRAST TECHNIQUE: Multiplanar, multiecho pulse sequences of the brain and surrounding structures were obtained without intravenous contrast. COMPARISON:  Head CT 06/19/2020 FINDINGS: Brain: There is no evidence of an acute infarct, intracranial hemorrhage, mass, midline shift, or extra-axial fluid collection. T2 hyperintensities in the cerebral white matter bilaterally are nonspecific but compatible with mild chronic small vessel ischemic disease. Chronic lacunar infarcts are noted in the right caudate nucleus and posterior right corona radiata. A chronic lacunar infarct is also noted in the right pons. There is moderate generalized cerebral atrophy. Vascular: Major intracranial vascular flow voids are preserved. Skull and upper cervical spine: Unremarkable bone marrow signal. Moderate to severe disc degeneration at C4-5 and C5-6 and severe left facet arthrosis at C4-5 with grade 1 anterolisthesis. Sinuses/Orbits: Mild bilateral ethmoid air cell mucosal thickening. Clear mastoid air cells. Unremarkable orbits. Other: None. IMPRESSION: 1. No acute intracranial abnormality. 2. Chronic small-vessel ischemia with multiple chronic lacunar infarcts. Electronically Signed   By: Logan Bores M.D.   On: 06/20/2020 10:09   DG CHEST PORT 1 VIEW  Result Date: 06/22/2020 CLINICAL DATA:  Shortness of breath EXAM: PORTABLE CHEST 1 VIEW COMPARISON:  Radiograph 05/29/2020 FINDINGS: There are some chronically coarsened interstitial and bronchitic features which are slightly accentuated in the bases possibly related to low  volumes and atelectatic change. Blunting of left costophrenic sulcus, possibly scarring versus a trace effusion. Pulmonary vascularity remains fairly well-defined. Cardiomediastinal contours are unremarkable. The osseous structures appear diffusely demineralized which may limit detection of small or nondisplaced fractures. No acute osseous abnormality or suspicious osseous lesion. Remaining soft tissues are unremarkable. Telemetry leads overlie the chest. IMPRESSION: 1. Chronic interstitial and bronchitic features are slightly accentuated in the bases, likely related to low volumes and atelectatic change. 2. Blunting of the left costophrenic sulcus, possibly scarring versus a trace effusion. Electronically Signed   By: Lovena Le M.D.   On: 06/22/2020 22:24   DG Chest Portable 1 View  Result Date: 05/29/2020 CLINICAL DATA:  Weakness EXAM: PORTABLE CHEST 1 VIEW COMPARISON:  12/16/2019 and prior. FINDINGS: No pneumothorax or pleural effusion. Cardiomediastinal silhouette within normal limits. Hazy appearance of the peripheral lung fields. No acute osseous abnormality. IMPRESSION: Hazy appearance of the peripheral lung fields may be artifactual however cannot exclude atypical infection. Electronically Signed   By: Primitivo Gauze M.D.   On: 05/29/2020 16:41   EEG adult  Result Date: 06/19/2020 [name], MD     06/19/2020  7:42 PM Patient Name: Amy [name] MRN: [id number] Epilepsy Attending: [name] Referring Physician/Provider: Anibal Henderson, NP Date: 06/19/2020 Duration: Patient history: 70yo F with sudden onset speech disturbance, right gaze deviation and seizure like episode. EEG to evaluate for seizure  Level of alertness: Awake AEDs during EEG study: LEV Technical aspects: This EEG study was done with scalp electrodes positioned according to the 10-20 International system of electrode placement. Electrical activity was acquired at a sampling rate of 500Hz  and reviewed with a high  frequency filter of 70Hz  and a low frequency filter of 1Hz . EEG data were recorded continuously and digitally stored. Description: No clear posterior dominant rhythm was seen.  EEG showed continuous generalized and maximal bifrontal 5-9hz  theta-delta activity as well as intermittent 2-3hz  delta slowing. Hyperventilation and photic stimulation were not  performed.   ABNORMALITY -Continuous slow, generalized and maximal bifrontal region IMPRESSION: This study is suggestive of cortical dysfunction in bifrontal region which is non specific but could be seen due to underlying structural abnormality, post-ictal state. There is also mild to moderate diffuse encephalopathy, nonspecific etiology. No seizures or epileptiform discharges were seen throughout the recording. [name]   CT HEAD CODE STROKE WO CONTRAST  Result Date: 06/19/2020 CLINICAL DATA:  Code stroke.  Acute stroke presentation. EXAM: CT HEAD WITHOUT CONTRAST TECHNIQUE: Contiguous axial images were obtained from the base of the skull through the vertex without intravenous contrast. COMPARISON:  12/16/2019 FINDINGS: Brain: Generalized atrophy. No focal infarction seen affecting the brainstem or cerebellum. Cerebral hemispheres show chronic small-vessel ischemic changes of the hemispheric white matter and the basal ganglia regions. No sign of acute infarction, mass lesion, hemorrhage hydrocephalus or extra-axial collection. Vascular: There is atherosclerotic calcification of the major vessels at the base of the brain. Skull: Negative Sinuses/Orbits: Clear/normal Other: None ASPECTS ([place] Stroke Program Early CT Score) - Ganglionic level infarction (caudate, lentiform nuclei, internal capsule, insula, M1-M3 cortex): 7 - Supraganglionic infarction (M4-M6 cortex): 3 Total score (0-10 with 10 being normal): 10 IMPRESSION: 1. No acute finding by CT. Atrophy and chronic small-vessel ischemic changes of the cerebral hemispheric white matter and basal  ganglia regions. 2. ASPECTS is 10. 3. These results were communicated to Dr. Charlsie Merles At 4:25 pmon 2/17/2022by text page via the [facility] messaging system. Electronically Signed   By: Nelson Chimes M.D.   On: 06/19/2020 16:25   CT ANGIO HEAD CODE STROKE  Result Date: 06/19/2020 CLINICAL DATA:  Left-sided neglect. Seizure activity. Acute stroke suspected. EXAM: CT ANGIOGRAPHY HEAD AND NECK TECHNIQUE: Multidetector CT imaging of the head and neck was performed using the standard protocol during bolus administration of intravenous contrast. Multiplanar CT image reconstructions and MIPs were obtained to evaluate the vascular anatomy. Carotid stenosis measurements (when applicable) are obtained utilizing NASCET criteria, using the distal internal carotid diameter as the denominator. CONTRAST:  67mL OMNIPAQUE IOHEXOL 350 MG/ML SOLN COMPARISON:  Head CT earlier same day FINDINGS: CTA NECK FINDINGS Aortic arch: Mild atherosclerotic change of the aortic arch. Branching pattern is normal without origin stenosis. Right carotid system: Common carotid artery widely patent to the bifurcation. Carotid bifurcation shows minimal plaque but there is no stenosis. Cervical ICA widely patent. Left carotid system: Common carotid artery widely patent to the bifurcation. Mild plaque at the bifurcation and ICA bulb but no stenosis. Cervical ICA widely patent. Vertebral arteries: Large vertebral arteries widely patent at their origins and through the cervical region to the foramen magnum. Skeleton: Mid cervical spondylosis which could be painful. Other neck: No adenopathy. Enlarged heterogeneous thyroid gland. Largest nodule in the isthmus measures up to 2 cm in diameter. Upper chest: Mild pleural and parenchymal scarring at the lung apices. Review of the MIP images confirms the above findings CTA HEAD FINDINGS Anterior circulation: Both internal carotid arteries are patent through the skull base and siphon regions. There is ordinary  atherosclerotic calcification in the carotid siphon regions but without stenosis greater than 30% suspected. The anterior and middle cerebral vessels are patent. No large or medium vessel occlusion identified. Distal branch vessels do show atherosclerotic irregularity. Posterior circulation: Both vertebral arteries widely patent through the foramen magnum to the basilar. There is focal plaque of the left vertebral artery V4 segment with stenosis estimated at 30-50%. No vertebral stenosis. Posterior circulation branch vessels are patent. Distal branch vessels do show  atherosclerotic irregularity. Venous sinuses: Patent and normal. Anatomic variants: None significant. Review of the MIP images confirms the above findings IMPRESSION: 1. No large or medium vessel occlusion. Widespread intracranial distal vessel atherosclerotic irregularity. 2. Minimal atherosclerotic change at both carotid bifurcations but without stenosis. 3. 30-50% stenosis of the left vertebral artery V4 segment. 4. Enlarged heterogeneous thyroid gland. Largest nodule in the isthmus measures up to 2 cm in diameter. Recommend thyroid US (ref: J Am Coll Radiol. 2015 Feb;12(2): 143-50). Aortic Atherosclerosis (ICD10-I70.0). Electronically Signed   By: Nelson Chimes M.D.   On: 06/19/2020 17:08   CT ANGIO NECK CODE STROKE  Result Date: 06/19/2020 CLINICAL DATA:  Left-sided neglect. Seizure activity. Acute stroke suspected. EXAM: CT ANGIOGRAPHY HEAD AND NECK TECHNIQUE: Multidetector CT imaging of the head and neck was performed using the standard protocol during bolus administration of intravenous contrast. Multiplanar CT image reconstructions and MIPs were obtained to evaluate the vascular anatomy. Carotid stenosis measurements (when applicable) are obtained utilizing NASCET criteria, using the distal internal carotid diameter as the denominator. CONTRAST:  33mL OMNIPAQUE IOHEXOL 350 MG/ML SOLN COMPARISON:  Head CT earlier same day FINDINGS: CTA NECK  FINDINGS Aortic arch: Mild atherosclerotic change of the aortic arch. Branching pattern is normal without origin stenosis. Right carotid system: Common carotid artery widely patent to the bifurcation. Carotid bifurcation shows minimal plaque but there is no stenosis. Cervical ICA widely patent. Left carotid system: Common carotid artery widely patent to the bifurcation. Mild plaque at the bifurcation and ICA bulb but no stenosis. Cervical ICA widely patent. Vertebral arteries: Large vertebral arteries widely patent at their origins and through the cervical region to the foramen magnum. Skeleton: Mid cervical spondylosis which could be painful. Other neck: No adenopathy. Enlarged heterogeneous thyroid gland. Largest nodule in the isthmus measures up to 2 cm in diameter. Upper chest: Mild pleural and parenchymal scarring at the lung apices. Review of the MIP images confirms the above findings CTA HEAD FINDINGS Anterior circulation: Both internal carotid arteries are patent through the skull base and siphon regions. There is ordinary atherosclerotic calcification in the carotid siphon regions but without stenosis greater than 30% suspected. The anterior and middle cerebral vessels are patent. No large or medium vessel occlusion identified. Distal branch vessels do show atherosclerotic irregularity. Posterior circulation: Both vertebral arteries widely patent through the foramen magnum to the basilar. There is focal plaque of the left vertebral artery V4 segment with stenosis estimated at 30-50%. No vertebral stenosis. Posterior circulation branch vessels are patent. Distal branch vessels do show atherosclerotic irregularity. Venous sinuses: Patent and normal. Anatomic variants: None significant. Review of the MIP images confirms the above findings IMPRESSION: 1. No large or medium vessel occlusion. Widespread intracranial distal vessel atherosclerotic irregularity. 2. Minimal atherosclerotic change at both carotid  bifurcations but without stenosis. 3. 30-50% stenosis of the left vertebral artery V4 segment. 4. Enlarged heterogeneous thyroid gland. Largest nodule in the isthmus measures up to 2 cm in diameter. Recommend thyroid US (ref: J Am Coll Radiol. 2015 Feb;12(2): 143-50). Aortic Atherosclerosis (ICD10-I70.0). Electronically Signed   By: Nelson Chimes M.D.   On: 06/19/2020 17:08

## 2020-06-23 NOTE — Progress Notes (Signed)
Critical troponin of 1,978. Paged Dr. Hal Hope

## 2020-06-23 NOTE — Progress Notes (Signed)
Physical Therapy Treatment Patient Details Name: Amy [name] MRN: [id number] DOB: Apr 18, 1951 Today's Date: 06/23/2020    History of Present Illness Amy [name] is a 70 y.o. female with medical history significant of ESRD on PD, HTN, multiple thyroid nodules. She had a hip fracture in December s/p ORIF. Recently hospitalized 1/25/-05/29/20 for watery diarrhea, hyponatremia, hypokalemia. She was found to be covid 19 positive but was not treated with no signs of respiratory involvement. had questionable seizure activity and was transfered to [facility] from [facility].    PT Comments    Pt limited in safe mobility by decreased BP in upright dropping to 74/60 with sitting for 3 min. Pt is modA for bed mobility, and min A for transfers and min guard for lateral stepping along EoB. RN requested pt not to be transferred to recliner. Able to static sit for about 12 min and perform some LE exercise asymptomatically and with return to supine BP 91/76. D/c plans remain appropriate at this time as feel pt will be able to mobilize once BP issues resolved. PT will continue to follow acutely.   Follow Up Recommendations  Home health PT;Supervision/Assistance - 24 hour     Equipment Recommendations  None recommended by PT       Precautions / Restrictions Precautions Precautions: Fall Precaution Comments: weakness, peritoneal diaylisis, swollen arms Restrictions Weight Bearing Restrictions: No    Mobility  Bed Mobility Overal bed mobility: Needs Assistance Bed Mobility: Rolling;Sidelying to Sit;Supine to Sit Rolling: Supervision Sidelying to sit: Min assist;Mod assist Supine to sit: Min assist     General bed mobility comments: min A for management of LE off bed and modA for bringing trunk to upright, min A for returning LE to bed    Transfers Overall transfer level: Needs assistance Equipment used: 1 person hand held assist;Rolling walker (2 wheeled) Transfers: Sit to/from [employer] Sit to Stand: Min assist;From elevated surface         General transfer comment: pt able to power up with min guard requires min A for steadying  Ambulation/Gait Ambulation/Gait assistance: Min guard Gait Distance (Feet): 3 Feet Assistive device: Rolling walker (2 wheeled) Gait Pattern/deviations: Step-to pattern;Trunk flexed;Shuffle Gait velocity: slowed Gait velocity interpretation: <1.31 ft/sec, indicative of household ambulator General Gait Details: min guard for lateral stepping towards HoB         Balance Overall balance assessment: Needs assistance Sitting-balance support: Feet supported;No upper extremity supported Sitting balance-Leahy Scale: Fair     Standing balance support: Single extremity supported Standing balance-Leahy Scale: Poor Standing balance comment: dependent on external support, decreased confidence                            Cognition Arousal/Alertness: Awake/alert Behavior During Therapy: Flat affect Overall Cognitive Status: Within Functional Limits for tasks assessed                                 General Comments: continues to be self limiting      Exercises General Exercises - Lower Extremity Ankle Circles/Pumps: AROM;Both;10 reps;Supine Heel Slides: Both;10 reps;AROM;Seated    General Comments General comments (skin integrity, edema, etc.): Pt with low BP 111/73 in sitting 84/55 with sitting 3 min 74/60 back in supine 91/76      Pertinent Vitals/Pain Pain Assessment: Faces Faces Pain Scale: Hurts little more Pain Location: buttocks, from freq episodes of  diarrhea Pain Descriptors / Indicators: Sore Pain Intervention(s): Limited activity within patient's tolerance;Monitored during session;Repositioned           PT Goals (current goals can now be found in the care plan section) Acute Rehab PT Goals Patient Stated Goal: home PT Goal Formulation: With patient/family Time For Goal  Achievement: 07/05/20 Potential to Achieve Goals: Good Progress towards PT goals: Not progressing toward goals - comment (limited by BP today)    Frequency    Min 3X/week      PT Plan Current plan remains appropriate       AM-PAC PT "6 Clicks" Mobility   Outcome Measure  Help needed turning from your back to your side while in a flat bed without using bedrails?: A Little Help needed moving from lying on your back to sitting on the side of a flat bed without using bedrails?: A Little Help needed moving to and from a bed to a chair (including a wheelchair)?: A Little Help needed standing up from a chair using your arms (e.g., wheelchair or bedside chair)?: A Little Help needed to walk in hospital room?: A Little Help needed climbing 3-5 steps with a railing? : A Lot 6 Click Score: 17    End of Session   Activity Tolerance: Treatment limited secondary to medical complications (Comment) (decreased BP in upright, RN request to return to bed) Patient left: with call bell/phone within reach;with family/visitor present;in bed;with bed alarm set (dtr present) Nurse Communication: Mobility status (decreased BP) PT Visit Diagnosis: Muscle weakness (generalized) (M62.81)     Time: 3845-3646 PT Time Calculation (min) (ACUTE ONLY): 45 min  Charges:  $Therapeutic Exercise: 8-22 mins $Therapeutic Activity: 23-37 mins                     Cedrik Heindl B. Migdalia Dk PT, DPT Acute Rehabilitation Services Pager [phone] Office [phone]    [place] 06/23/2020, 1:02 PM

## 2020-06-23 NOTE — Consult Note (Signed)
Cardiology Consultation:   Patient ID: Amy [name]; [id number]; 1950-09-27   Admit date: 06/19/2020 Date of Consult: 06/23/2020  Primary Care Provider: [name], MD Primary Cardiologist: Dr. Nelva Bush, MD   Patient Profile:   Amy [name] is a 70 y.o. female with a hx of ESRD on PD, anemia of chronic disease, HTN and known thyroid nodules who is being seen today for the evaluation of elevated troponin and tachycardia at the request of Dr. Candiss Norse.  History of Present Illness:   Amy [name] is a 70yo F with a hx as stated above who presented to [facility] with seizure like activity per husbands report.  HPI somewhat difficult to obtain as patient has poor recollection of events leading to her hospitalization.  Per chart review, she was recently hospitalized 05/27/20-05/29/20 for watery diarrhea, hyponatremia and hypokalemia. She was found to be COVID positive on presentation and also positive of C.Diff by PCR. She was treated with Vancomycin for 9 days without resolution. She continued to have copious watery diarrhea with subsequent increased weakness. She was noted to have had a neurological event at which time she had a rightward gaze with slumping that last approximately 1 minute. Family called EMS for transport to the ED for further evaluation.   In the ED, she was again found to be hypokalemic (2.1), hyponatremic(122) with hypomagnesemia (1.4). Neurology work up included MRI that was negative however EEG showed postictal slowing. Electrolytes were aggressively replaced. She was placed on IV Keppra. She was placed on Dificid given that she failed Vancomycin. She was also found to be anemic, therefore she was transfused with 1U PRBCs.   On 06/22/20 she had an episode of tachycardia. EKG shows accelerated junction with prolonged QTc versus NSR with 1st degree AV block and PACs.  IM obtained HsT along with TSH, echocardiogram, CTA given an elevated D-dimer and BNP.  High-sensitivity troponin  levels came back markedly elevated at [place], [place] and 1397. BNP elevated at 1142. On my exam, history is somewhat difficult to obtain.  She specifically denies anginal symptoms however does report some episodes of palpitations although this is a bit unclear.  She has no prior history of CAD.  She denies LE edema, orthopnea, dizziness, presyncopal or syncopal episodes.  She does report some DOE however feels this is secondary to her profound dehydration.  She was last seen by our service in 04/2019 for the evaluation of atrial flutter and tachycardia. At consultation, she was felt to have typical atrial flutter with variable AV block though she had converted to ST with PACs by cardiology evaluation. At the time, she was being treated for an acute infection, felt to be driving the atrial flutter. , though this has now converted to sinus tachycardia with PACs and brief atrial runs. She was started on IV diltiazem along with diltiazem. Given her hx of chronic anemia, AC was deferred initially. After further discussion, she was started on Eliquis 2.5mg  BID. At cardiology sign off, she was tolerating metoprolol 75mg  BID. Plan was for OP follow up with ZIO monitor however it appears she has been lost to follow up since this time.   Echocardiogram 09/12/2018 with LVEF at 65 to 70% with borderline LVH and G2 DD.  There was small pericardial effusion circumferential and no evidence of valvular disease.  Past Medical History:  Diagnosis Date  . Anemia    a. 08/2016 Guaiac + stool. EGD w/ gastritis.  . Anxiety   . CKD (chronic kidney  disease), stage III ([place])   . Closed fracture of shaft of left ulna 11/27/2018  . Colon polyp    a. 08/2016 Colonoscopy.  . Gastritis    a. 08/2016 EGD: Gastritis-->PPI.  [name] GERD (gastroesophageal reflux disease)   . History of chicken pox   . History of measles as a child   . Hypertension   . Hyponatremia    a. 08/2016 in setting of HCTZ Rx.  . Mesenteric lymphadenopathy 04/08/2017   . Multiple thyroid nodules     Past Surgical History:  Procedure Laterality Date  . ABDOMINAL HYSTERECTOMY  2003   due to heavy periods and clotting during menses  . BREAST LUMPECTOMY Left 1990  . CHOLECYSTECTOMY  2010  . COLONOSCOPY WITH PROPOFOL N/A 09/07/2016   Procedure: COLONOSCOPY WITH PROPOFOL;  Surgeon: Lucilla Lame, MD;  Location: [facility] ENDOSCOPY;  Service: Endoscopy;  Laterality: N/A;  . DIALYSIS/PERMA CATHETER INSERTION N/A 11/16/2018   Procedure: DIALYSIS/PERMA CATHETER INSERTION;  Surgeon: Algernon Huxley, MD;  Location: [facility] LAB;  Service: Cardiovascular;  Laterality: N/A;  . DIALYSIS/PERMA CATHETER REMOVAL N/A 08/16/2019   Procedure: DIALYSIS/PERMA CATHETER REMOVAL;  Surgeon: Algernon Huxley, MD;  Location: [facility];  Service: Cardiovascular;  Laterality: N/A;  . DILATION AND CURETTAGE OF UTERUS  1985  . ESOPHAGOGASTRODUODENOSCOPY (EGD) WITH PROPOFOL N/A 09/07/2016   Procedure: ESOPHAGOGASTRODUODENOSCOPY (EGD) WITH PROPOFOL;  Surgeon: Lucilla Lame, MD;  Location: [facility] ENDOSCOPY;  Service: Endoscopy;  Laterality: N/A;  . GALLBLADDER SURGERY  2012  . INTRAMEDULLARY (IM) NAIL INTERTROCHANTERIC Right 04/23/2020   Procedure: INTRAMEDULLARY (IM) NAIL INTERTROCHANTRIC;  Surgeon: Thornton Park, MD;  Location: [facility] ORS;  Service: Orthopedics;  Laterality: Right;  . KNEE SURGERY     Dr. Tamala Julian  . TONSILLECTOMY    . TONSILLECTOMY AND ADENOIDECTOMY  1970  . UPPER GI ENDOSCOPY  03/02/2006   H. Pylori negative; Dr. Allen Norris     Prior to Admission medications   Medication Sig Start Date End Date Taking? Authorizing Provider  buPROPion (WELLBUTRIN) 75 MG tablet TAKE 1 TABLET(75 MG) BY MOUTH TWICE DAILY Patient taking differently: Take 75 mg by mouth 2 (two) times daily. 02/11/20  Yes [name], MD  butalbital-acetaminophen-caffeine (FIORICET) 50-325-40 MG tablet TAKE 1 TO 2 TABLETS BY MOUTH EVERY 4 HOURS AS NEEDED FOR HEADACHE Patient taking differently: Take 2  tablets by mouth every 6 (six) hours as needed for headache. 05/25/20  Yes [name], MD  calcitRIOL (ROCALTROL) 0.5 MCG capsule Take 0.5 mcg by mouth daily.   Yes [provider]  cinacalcet (SENSIPAR) 30 MG tablet Take 30 mg by mouth daily.   Yes [provider]  cloNIDine (CATAPRES) 0.2 MG tablet Take 1 tablet (0.2 mg total) by mouth 2 (two) times daily. Patient taking differently: Take 0.2 mg by mouth 3 (three) times daily. 12/17/19 01/16/20 Yes Max Sane, MD  esomeprazole (NEXIUM) 20 MG capsule Take 20 mg by mouth daily. 02/20/19  Yes [provider]  hydrALAZINE (APRESOLINE) 50 MG tablet Take 1 tablet (50 mg total) by mouth 3 (three) times daily as needed. For SBP > 160 mm hg 12/17/19 01/16/20 Yes Shah, Vipul, MD  LORazepam (ATIVAN) 1 MG tablet TAKE 1 TABLET(1 MG) BY MOUTH TWICE DAILY AS NEEDED Patient taking differently: Take 1 mg by mouth 2 (two) times daily as needed for anxiety or sleep. 03/17/20  Yes [name], MD  losartan (COZAAR) 100 MG tablet Take 100 mg by mouth at bedtime. 02/20/19  Yes  [provider]  metoprolol tartrate (LOPRESSOR) 50 MG tablet Take 75 mg by mouth 2 (two) times daily.   Yes [provider]  sennosides-docusate sodium (SENOKOT-S) 8.6-50 MG tablet Take 2 tablets by mouth 2 (two) times daily as needed for constipation. 04/26/20  Yes Max Sane, MD  sodium bicarbonate 650 MG tablet Take 1 tablet (650 mg total) by mouth 2 (two) times daily. 05/31/20  Yes Sharen Hones, MD  torsemide (DEMADEX) 100 MG tablet Take 100 mg by mouth 2 (two) times daily.   Yes [provider]  valACYclovir (VALTREX) 1000 MG tablet Take 1,000 mg by mouth 2 (two) times daily as needed (cold sores). 09/11/19  Yes [provider]  zolpidem (AMBIEN) 10 MG tablet TAKE 1/2 TO 1 TABLET(5 TO 10 MG) BY MOUTH AT BEDTIME AS NEEDED Patient taking differently: Take 10 mg by mouth at bedtime. 05/16/20  Yes [name], MD   carvedilol (COREG) 25 MG tablet Take 1 tablet (25 mg total) by mouth 2 (two) times daily with a meal. Patient not taking: No sig reported 03/24/20   [name], MD  metroNIDAZOLE (FLAGYL) 500 MG tablet Take 500 mg by mouth 3 (three) times daily. Patient not taking: Reported on 06/19/2020    [provider]    Inpatient Medications: Scheduled Meds: . sodium chloride   Intravenous Once  . [START ON 06/24/2020] aspirin EC  81 mg Oral Daily  . calcitRIOL  0.5 mcg Oral Daily  . Chlorhexidine Gluconate Cloth  6 each Topical Daily  . cinacalcet  30 mg Oral Q breakfast  . darbepoetin (ARANESP) injection - NON-DIALYSIS  60 mcg Subcutaneous Q Sat-1800  . feeding supplement (NEPRO CARB STEADY)  237 mL Oral BID BM  . fidaxomicin  200 mg Oral BID  . gentamicin cream  1 application Topical Daily  . heparin  5,000 Units Subcutaneous Q8H  . metoprolol tartrate  25 mg Oral BID  . multivitamin  1 tablet Oral QHS  . sodium bicarbonate  650 mg Oral TID  . sodium chloride flush  3 mL Intravenous Once   Continuous Infusions: . levETIRAcetam 500 mg (06/23/20 0359)   PRN Meds: acetaminophen **OR** [DISCONTINUED] acetaminophen, Gerhardt's butt cream, dianeal solution for CAPD/[name] with heparin, traZODone  Allergies:    Allergies  Allergen Reactions  . Cephalosporins Hives and Swelling  . Clarithromycin Other (See Comments)    Unknown reaction  . Lunesta  [Eszopiclone]     Heart racing  . Penicillin V     Has patient had a PCN reaction causing immediate rash, facial/tongue/throat swelling, SOB or lightheadedness with hypotension: Yes Has patient had a PCN reaction causing severe rash involving mucus membranes or skin necrosis: No Has patient had a PCN reaction that required hospitalization No Has patient had a PCN reaction occurring within the last 10 years: No If all of the above answers are "NO", then may proceed with Cephalosporin use.  . Sulfa Antibiotics     Unknown reaction     Social History:   Social History   Socioeconomic History  . Marital status: Married    Spouse name: Not on file  . Number of children: 1  . Years of education: Not on file  . Highest education level: Bachelor's degree (e.g., BA, AB, BS)  Occupational History  . Occupation: Retired  Tobacco Use  . Smoking status: Never Smoker  . Smokeless tobacco: Never Used  Vaping Use  . Vaping Use: Never used  Substance and Sexual  Activity  . Alcohol use: Yes    Alcohol/week: 0.0 - 2.0 standard drinks  . Drug use: No  . Sexual activity: Yes  Other Topics Concern  . Not on file  Social History Narrative   Lives in [place] with husband.  Does not routinely exercise.   Social Determinants of Health   Financial Resource Strain: Low Risk   . Difficulty of Paying Living Expenses: Not hard at all  Food Insecurity: No Food Insecurity  . Worried About Charity fundraiser in the Last Year: Never true  . Ran Out of Food in the Last Year: Never true  Transportation Needs: No Transportation Needs  . Lack of Transportation (Medical): No  . Lack of Transportation (Non-Medical): No  Physical Activity: Inactive  . Days of Exercise per Week: 0 days  . Minutes of Exercise per Session: 0 min  Stress: No Stress Concern Present  . Feeling of Stress : Not at all  Social Connections: Moderately Integrated  . Frequency of Communication with Friends and Family: More than three times a week  . Frequency of Social Gatherings with Friends and Family: Three times a week  . Attends Religious Services: More than 4 times per year  . Active Member of Clubs or Organizations: No  . Attends Archivist Meetings: Never  . Marital Status: Married  Human resources officer Violence: Not At Risk  . Fear of Current or Ex-Partner: No  . Emotionally Abused: No  . Physically Abused: No  . Sexually Abused: No    Family History:   Family History  Problem Relation Age of Onset  . Pancreatic cancer Father   .  Congestive Heart Failure Maternal Grandmother   . Cancer Paternal Grandfather   . Heart disease Maternal Uncle    Family Status:  Family Status  Relation Name Status  . Mother  Deceased at age 49  . Father  Deceased at age 72       pancreatic cancer  . MGM  Deceased       CHF  . PGF  Deceased  . Mat Uncle  (Not Specified)    ROS:  Please see the history of present illness.  All other ROS reviewed and negative.     Physical Exam/Data:   Vitals:   06/23/20 0917 06/23/20 0930 06/23/20 1200 06/23/20 1300  BP: 104/74 111/77 111/84 102/70  Pulse: (!) 121 (!) 119 (!) 105   Resp:  (!) 22 18 19   Temp:  98.6 F (37 C)    TempSrc:  Oral (P) Oral   SpO2:  98%    Weight:      Height:        Intake/Output Summary (Last 24 hours) at 06/23/2020 1418 Last data filed at 06/22/2020 2200 Gross per 24 hour  Intake 241.22 ml  Output --  Net 241.22 ml   Filed Weights   06/19/20 1713 06/20/20 1931 06/23/20 0436  Weight: 51.2 kg 52.6 kg 60.2 kg   Body mass index is 22.78 kg/m.   General: Ill-appearing, NAD Neck: Negative for carotid bruits. No JVD Lungs:Clear to ausculation bilaterally.Breathing is unlabored. Cardiovascular: RRR with S1 S2. No murmurs Abdomen: Soft, non-tender, distended. No obvious abdominal masses. Extremities: No edema.  Radial pulses 2+ bilaterally Neuro: Alert and oriented. No focal deficits. No facial asymmetry. MAE spontaneously. Psych: Responds to questions appropriately with normal affect.    EKG:  The EKG was personally reviewed and demonstrates: ST with HR 123bpm with no acute changes.  Telemetry:  Telemetry was personally reviewed and demonstrates:  ST with HR 105-115bpm   Relevant CV Studies:  Echocardiogram 04/2019:   1. Left ventricular ejection fraction, by visual estimation, is 65 to  70%. The left ventricle has normal function. There is borderline left  ventricular hypertrophy.  2. Left ventricular diastolic parameters are consistent with  Grade II  diastolic dysfunction (pseudonormalization).  3. Global right ventricle has normal systolic function.The right  ventricular size is not assessed. No increase in right ventricular wall  thickness.  4. Left atrial size was mildly dilated.  5. Right atrial size was normal.  6. Small pericardial effusion.  7. The pericardial effusion is circumferential.  8. The mitral valve is normal in structure. No evidence of mitral valve  regurgitation.  9. The tricuspid valve is normal in structure. Tricuspid valve  regurgitation is not demonstrated.  10. The aortic valve is normal in structure. Aortic valve regurgitation is  not visualized.  11. The pulmonic valve was not well visualized. Pulmonic valve  regurgitation is not visualized.  12. Normal pulmonary artery systolic pressure.  13. The inferior vena cava is dilated in size with >50% respiratory  variability, suggesting right atrial pressure of 8 mmHg.   Echocardiogram 04/22/2021: Pending  Laboratory Data:  Chemistry Recent Labs  Lab 06/21/20 1151 06/22/20 0139 06/23/20 0057  NA 126* 126* 130*  K 4.6 4.6 4.0  CL 95* 96* 100  CO2 16* 19* 18*  GLUCOSE 81 90 193*  BUN 34* 38* 36*  CREATININE 4.97* 5.26* 5.04*  CALCIUM 8.0* 7.8* 8.0*  GFRNONAA 9* 8* 9*  ANIONGAP 15 11 12     Total Protein  Date Value Ref Range Status  06/23/2020 4.1 (L) 6.5 - 8.1 g/dL Final  10/06/2018 5.4 (L) 6.0 - 8.5 g/dL Final   Albumin  Date Value Ref Range Status  06/23/2020 1.9 (L) 3.5 - 5.0 g/dL Final  10/06/2018 4.1 3.8 - 4.8 g/dL Final   AST  Date Value Ref Range Status  06/23/2020 33 15 - 41 U/L Final   ALT  Date Value Ref Range Status  06/23/2020 23 0 - 44 U/L Final   Alkaline Phosphatase  Date Value Ref Range Status  06/23/2020 90 38 - 126 U/L Final   Total Bilirubin  Date Value Ref Range Status  06/23/2020 0.4 0.3 - 1.2 mg/dL Final   Bilirubin Total  Date Value Ref Range Status  10/06/2018 <0.2 0.0 - 1.2 mg/dL  Final   Hematology Recent Labs  Lab 06/22/20 0704 06/22/20 1131 06/23/20 0057  WBC 11.4* 14.1* 9.3  RBC 3.01* 3.30* 3.22*  HGB 9.3* 10.1* 9.9*  HCT 25.5* 28.0* 27.9*  MCV 84.7 84.8 86.6  MCH 30.9 30.6 30.7  MCHC 36.5* 36.1* 35.5  RDW 17.1* 17.2* 17.8*  PLT 330 307 263   Cardiac EnzymesNo results for input(s): TROPONINI in the last 168 hours. No results for input(s): TROPIPOC in the last 168 hours.  BNP Recent Labs  Lab 06/21/20 0623 06/22/20 0139 06/23/20 0057  BNP 276.9* 269.5* 1,142.5*    DDimer  Recent Labs  Lab 06/22/20 0139 06/22/20 2216 06/23/20 0057  DDIMER 1.27* 1.64* 1.98*   TSH:  Lab Results  Component Value Date   TSH 4.334 06/22/2020   Lipids: Lab Results  Component Value Date   CHOL 165 09/24/2016   HDL 62 09/24/2016   LDLCALC 78 09/24/2016   TRIG 126 09/24/2016   CHOLHDL 2.7 09/24/2016   HgbA1c:No results found for: HGBA1C  Radiology/Studies:  CT ANGIO  CHEST PE W OR WO CONTRAST  Result Date: 06/23/2020 CLINICAL DATA:  Tachycardia. Elevated D-dimer. Pulmonary embolism suspected. Recent right hip ORIF. EXAM: CT ANGIOGRAPHY CHEST WITH CONTRAST TECHNIQUE: Multidetector CT imaging of the chest was performed using the standard protocol during bolus administration of intravenous contrast. Multiplanar CT image reconstructions and MIPs were obtained to evaluate the vascular anatomy. CONTRAST:  21mL OMNIPAQUE IOHEXOL 350 MG/ML SOLN COMPARISON:  CT 04/06/2017, x-ray 06/22/2020 FINDINGS: Cardiovascular: Satisfactory opacification of the pulmonary arteries to the segmental level. No evidence of pulmonary embolism. Thoracic aorta is nonaneurysmal. Scattered atherosclerotic calcification of the aorta and coronary arteries. Normal heart size. No pericardial effusion. Mediastinum/Nodes: No axillary, mediastinal, or hilar lymphadenopathy. Trachea and esophagus within normal limits. Redemonstration of multiple thyroid lobe nodules measuring up to 1.5 cm in the right  thyroid lobe and 1.7 cm in the region of the thyroid isthmus. Isthmus nodule has slightly increased in size from prior CT. Lungs/Pleura: Small left and trace right pleural effusions with associated compressive atelectasis. Lungs are otherwise clear. No pneumothorax. Upper Abdomen: Small volume ascites within the visualized upper abdomen. A small portion of the colon is visualized in the region of the splenic flexure with associated colonic wall thickening (series 5, image 92). Musculoskeletal: Mild anasarca.  No acute osseous findings. Review of the MIP images confirms the above findings. IMPRESSION: 1. Negative for pulmonary embolism. 2. Findings of volume overload with bilateral pleural effusions, ascites, and anasarca. 3. A small portion of the colon is visualized in the region of the splenic flexure with associated colonic wall thickening. Findings suggestive of colitis in the setting of known Clostridium difficile infection. 4. Redemonstration of multiple thyroid lobe nodules. Isthmus nodule has slightly increased in size from prior CT. Recommend thyroid US (ref: J Am Coll Radiol. 2015 Feb;12(2): 143-50). 5. Aortic and coronary artery atherosclerosis (ICD10-I70.0). Electronically Signed   By: Davina Poke D.O.   On: 06/23/2020 11:26   MR BRAIN WO CONTRAST  Result Date: 06/20/2020 CLINICAL DATA:  Left-sided neglect and confusion.  Seizure activity. EXAM: MRI HEAD WITHOUT CONTRAST TECHNIQUE: Multiplanar, multiecho pulse sequences of the brain and surrounding structures were obtained without intravenous contrast. COMPARISON:  Head CT 06/19/2020 FINDINGS: Brain: There is no evidence of an acute infarct, intracranial hemorrhage, mass, midline shift, or extra-axial fluid collection. T2 hyperintensities in the cerebral white matter bilaterally are nonspecific but compatible with mild chronic small vessel ischemic disease. Chronic lacunar infarcts are noted in the right caudate nucleus and posterior right  corona radiata. A chronic lacunar infarct is also noted in the right pons. There is moderate generalized cerebral atrophy. Vascular: Major intracranial vascular flow voids are preserved. Skull and upper cervical spine: Unremarkable bone marrow signal. Moderate to severe disc degeneration at C4-5 and C5-6 and severe left facet arthrosis at C4-5 with grade 1 anterolisthesis. Sinuses/Orbits: Mild bilateral ethmoid air cell mucosal thickening. Clear mastoid air cells. Unremarkable orbits. Other: None. IMPRESSION: 1. No acute intracranial abnormality. 2. Chronic small-vessel ischemia with multiple chronic lacunar infarcts. Electronically Signed   By: Logan Bores M.D.   On: 06/20/2020 10:09   DG CHEST PORT 1 VIEW  Result Date: 06/22/2020 CLINICAL DATA:  Shortness of breath EXAM: PORTABLE CHEST 1 VIEW COMPARISON:  Radiograph 05/29/2020 FINDINGS: There are some chronically coarsened interstitial and bronchitic features which are slightly accentuated in the bases possibly related to low volumes and atelectatic change. Blunting of left costophrenic sulcus, possibly scarring versus a trace effusion. Pulmonary vascularity remains fairly well-defined. Cardiomediastinal contours are unremarkable.  The osseous structures appear diffusely demineralized which may limit detection of small or nondisplaced fractures. No acute osseous abnormality or suspicious osseous lesion. Remaining soft tissues are unremarkable. Telemetry leads overlie the chest. IMPRESSION: 1. Chronic interstitial and bronchitic features are slightly accentuated in the bases, likely related to low volumes and atelectatic change. 2. Blunting of the left costophrenic sulcus, possibly scarring versus a trace effusion. Electronically Signed   By: Lovena Le M.D.   On: 06/22/2020 22:24   EEG adult  Result Date: 06/19/2020 [name], MD     06/19/2020  7:42 PM Patient Name: CHARLEAN CARNEAL MRN: [id number] Epilepsy Attending: [name] Referring  Physician/Provider: Anibal Henderson, NP Date: 06/19/2020 Duration: Patient history: 70yo F with sudden onset speech disturbance, right gaze deviation and seizure like episode. EEG to evaluate for seizure  Level of alertness: Awake AEDs during EEG study: LEV Technical aspects: This EEG study was done with scalp electrodes positioned according to the 10-20 International system of electrode placement. Electrical activity was acquired at a sampling rate of 500Hz  and reviewed with a high frequency filter of 70Hz  and a low frequency filter of 1Hz . EEG data were recorded continuously and digitally stored. Description: No clear posterior dominant rhythm was seen.  EEG showed continuous generalized and maximal bifrontal 5-9hz  theta-delta activity as well as intermittent 2-3hz  delta slowing. Hyperventilation and photic stimulation were not performed.   ABNORMALITY -Continuous slow, generalized and maximal bifrontal region IMPRESSION: This study is suggestive of cortical dysfunction in bifrontal region which is non specific but could be seen due to underlying structural abnormality, post-ictal state. There is also mild to moderate diffuse encephalopathy, nonspecific etiology. No seizures or epileptiform discharges were seen throughout the recording. [name]   CT HEAD CODE STROKE WO CONTRAST  Result Date: 06/19/2020 CLINICAL DATA:  Code stroke.  Acute stroke presentation. EXAM: CT HEAD WITHOUT CONTRAST TECHNIQUE: Contiguous axial images were obtained from the base of the skull through the vertex without intravenous contrast. COMPARISON:  12/16/2019 FINDINGS: Brain: Generalized atrophy. No focal infarction seen affecting the brainstem or cerebellum. Cerebral hemispheres show chronic small-vessel ischemic changes of the hemispheric white matter and the basal ganglia regions. No sign of acute infarction, mass lesion, hemorrhage hydrocephalus or extra-axial collection. Vascular: There is atherosclerotic calcification of  the major vessels at the base of the brain. Skull: Negative Sinuses/Orbits: Clear/normal Other: None ASPECTS ([place] Stroke Program Early CT Score) - Ganglionic level infarction (caudate, lentiform nuclei, internal capsule, insula, M1-M3 cortex): 7 - Supraganglionic infarction (M4-M6 cortex): 3 Total score (0-10 with 10 being normal): 10 IMPRESSION: 1. No acute finding by CT. Atrophy and chronic small-vessel ischemic changes of the cerebral hemispheric white matter and basal ganglia regions. 2. ASPECTS is 10. 3. These results were communicated to Dr. Charlsie Merles At 4:25 pmon 2/17/2022by text page via the [facility] messaging system. Electronically Signed   By: Nelson Chimes M.D.   On: 06/19/2020 16:25   CT ANGIO HEAD CODE STROKE  Result Date: 06/19/2020 CLINICAL DATA:  Left-sided neglect. Seizure activity. Acute stroke suspected. EXAM: CT ANGIOGRAPHY HEAD AND NECK TECHNIQUE: Multidetector CT imaging of the head and neck was performed using the standard protocol during bolus administration of intravenous contrast. Multiplanar CT image reconstructions and MIPs were obtained to evaluate the vascular anatomy. Carotid stenosis measurements (when applicable) are obtained utilizing NASCET criteria, using the distal internal carotid diameter as the denominator. CONTRAST:  61mL OMNIPAQUE IOHEXOL 350 MG/ML SOLN COMPARISON:  Head CT earlier same day FINDINGS:  CTA NECK FINDINGS Aortic arch: Mild atherosclerotic change of the aortic arch. Branching pattern is normal without origin stenosis. Right carotid system: Common carotid artery widely patent to the bifurcation. Carotid bifurcation shows minimal plaque but there is no stenosis. Cervical ICA widely patent. Left carotid system: Common carotid artery widely patent to the bifurcation. Mild plaque at the bifurcation and ICA bulb but no stenosis. Cervical ICA widely patent. Vertebral arteries: Large vertebral arteries widely patent at their origins and through the cervical region to  the foramen magnum. Skeleton: Mid cervical spondylosis which could be painful. Other neck: No adenopathy. Enlarged heterogeneous thyroid gland. Largest nodule in the isthmus measures up to 2 cm in diameter. Upper chest: Mild pleural and parenchymal scarring at the lung apices. Review of the MIP images confirms the above findings CTA HEAD FINDINGS Anterior circulation: Both internal carotid arteries are patent through the skull base and siphon regions. There is ordinary atherosclerotic calcification in the carotid siphon regions but without stenosis greater than 30% suspected. The anterior and middle cerebral vessels are patent. No large or medium vessel occlusion identified. Distal branch vessels do show atherosclerotic irregularity. Posterior circulation: Both vertebral arteries widely patent through the foramen magnum to the basilar. There is focal plaque of the left vertebral artery V4 segment with stenosis estimated at 30-50%. No vertebral stenosis. Posterior circulation branch vessels are patent. Distal branch vessels do show atherosclerotic irregularity. Venous sinuses: Patent and normal. Anatomic variants: None significant. Review of the MIP images confirms the above findings IMPRESSION: 1. No large or medium vessel occlusion. Widespread intracranial distal vessel atherosclerotic irregularity. 2. Minimal atherosclerotic change at both carotid bifurcations but without stenosis. 3. 30-50% stenosis of the left vertebral artery V4 segment. 4. Enlarged heterogeneous thyroid gland. Largest nodule in the isthmus measures up to 2 cm in diameter. Recommend thyroid US (ref: J Am Coll Radiol. 2015 Feb;12(2): 143-50). Aortic Atherosclerosis (ICD10-I70.0). Electronically Signed   By: Nelson Chimes M.D.   On: 06/19/2020 17:08   CT ANGIO NECK CODE STROKE  Result Date: 06/19/2020 CLINICAL DATA:  Left-sided neglect. Seizure activity. Acute stroke suspected. EXAM: CT ANGIOGRAPHY HEAD AND NECK TECHNIQUE: Multidetector CT  imaging of the head and neck was performed using the standard protocol during bolus administration of intravenous contrast. Multiplanar CT image reconstructions and MIPs were obtained to evaluate the vascular anatomy. Carotid stenosis measurements (when applicable) are obtained utilizing NASCET criteria, using the distal internal carotid diameter as the denominator. CONTRAST:  46mL OMNIPAQUE IOHEXOL 350 MG/ML SOLN COMPARISON:  Head CT earlier same day FINDINGS: CTA NECK FINDINGS Aortic arch: Mild atherosclerotic change of the aortic arch. Branching pattern is normal without origin stenosis. Right carotid system: Common carotid artery widely patent to the bifurcation. Carotid bifurcation shows minimal plaque but there is no stenosis. Cervical ICA widely patent. Left carotid system: Common carotid artery widely patent to the bifurcation. Mild plaque at the bifurcation and ICA bulb but no stenosis. Cervical ICA widely patent. Vertebral arteries: Large vertebral arteries widely patent at their origins and through the cervical region to the foramen magnum. Skeleton: Mid cervical spondylosis which could be painful. Other neck: No adenopathy. Enlarged heterogeneous thyroid gland. Largest nodule in the isthmus measures up to 2 cm in diameter. Upper chest: Mild pleural and parenchymal scarring at the lung apices. Review of the MIP images confirms the above findings CTA HEAD FINDINGS Anterior circulation: Both internal carotid arteries are patent through the skull base and siphon regions. There is ordinary atherosclerotic calcification in the carotid siphon  regions but without stenosis greater than 30% suspected. The anterior and middle cerebral vessels are patent. No large or medium vessel occlusion identified. Distal branch vessels do show atherosclerotic irregularity. Posterior circulation: Both vertebral arteries widely patent through the foramen magnum to the basilar. There is focal plaque of the left vertebral artery V4  segment with stenosis estimated at 30-50%. No vertebral stenosis. Posterior circulation branch vessels are patent. Distal branch vessels do show atherosclerotic irregularity. Venous sinuses: Patent and normal. Anatomic variants: None significant. Review of the MIP images confirms the above findings IMPRESSION: 1. No large or medium vessel occlusion. Widespread intracranial distal vessel atherosclerotic irregularity. 2. Minimal atherosclerotic change at both carotid bifurcations but without stenosis. 3. 30-50% stenosis of the left vertebral artery V4 segment. 4. Enlarged heterogeneous thyroid gland. Largest nodule in the isthmus measures up to 2 cm in diameter. Recommend thyroid US (ref: J Am Coll Radiol. 2015 Feb;12(2): 143-50). Aortic Atherosclerosis (ICD10-I70.0). Electronically Signed   By: Nelson Chimes M.D.   On: 06/19/2020 17:08   Assessment and Plan:   1. Elevated HsT with tachycardia: -Pt presented to [facility] with seizure like activity. She was recently hospitalized 05/27/20-05/29/20 for watery diarrhea, hyponatremia and hypokalemia. She was found to be COVID positive on presentation and also positive of C.Diff by PCR.  After discharge, she continued to have copious watery diarrhea with subsequent increased weakness. She was noted to have had a neurological event at which time she had a rightward gaze with slumping that last approximately 1 minute per husband's report. Family called EMS for transport to the ED for further evaluation.  -She was found to be severely hypokalemic at 2.1, hyponatremic at 122 with hypomagnesemia at 1.4.  -Neurology was consulted with work up that included brain MRI was negative however EEG showed postictal slowing>>electrolytes were aggressively replaced with stabilization. -On 06/22/20 she had an episode of tachycardia?   -IM obtained HsT found to be 1978>>1554>>>1397 -BNP 1142 -Dimer, 1.98>>>CTA with no PE however with scattered atherosclerotic calcification of the aorta and  coronary arteries -In the absence of active anginal symptoms, would defer further ischemic work-up for now and consider OP Lexiscan stress testing once more stable from acute infection.  -Continue metoprolol for now and follow  -No further cardiac workup planned at this time   2.  History of atrial flutter with RVR: -Patient was seen by cardiology 04/2019 after hospital admission found to have new onset atrial flutter with RVR.  She was treated with IV diltiazem and metoprolol and was eventually anticoagulated with Eliquis 2.5 mg twice daily at hospital sign off.  Plan was for close outpatient follow-up with ZIO monitor however she has been lost to cardiology follow-up since this time. -Telemetry with no AF  -No AC due to anemia  -Continue metoprolol   3.  ESRD on peritoneal dialysis: -Follows with outpatient nephrology -Reports HD for approximately 2 years -Nephrology consulted for PD management -On PTA torsemide 100 mg twice daily>> per nephrology  4.  HTN: -Has been somewhat hypotensive since admission at 102/70, 111/84, 111/77 -On home losartan 100 mg daily, hydralazine 50 mg 3 times daily, carvedilol 25 mg twice daily -Losartan and hydralazine currently being held due to soft BPs -Patient remains on Lopressor 25 mg twice daily  5.  Anemia of chronic disease: -Hemoglobin, 10.1 on 06/22/2020 which is dropped to 9.9 today -No signs or symptoms of active bleeding -Appears to be at her most recent baseline   Other hospital issues per primary team include: -C.  Difficile -C. difficile colitis -Seizure -Pressure ulcer -Anxiety -Electrolyte abnormalities -Anemia   For questions or updates, please contact [facility] Please consult [email or web address] for contact info under Cardiology/STEMI.   SignedKathyrn Drown NP-C [facility] Pager: [phone] 06/23/2020 2:18 PM

## 2020-06-23 NOTE — Progress Notes (Signed)
Initial Nutrition Assessment  DOCUMENTATION CODES:   Severe malnutrition in context of acute illness/injury  INTERVENTION:   - Nepro Shake po BID, each supplement provides 425 kcal and 19 grams protein  - Renal MVI daily  NUTRITION DIAGNOSIS:   Severe Malnutrition related to acute illness (C. difficile, recent COVID-19 infection) as evidenced by moderate fat depletion,severe muscle depletion.  GOAL:   Patient will meet greater than or equal to 90% of their needs  MONITOR:   PO intake,Supplement acceptance,Labs,Weight trends,Skin,I & O's  REASON FOR ASSESSMENT:   Malnutrition Screening Tool    ASSESSMENT:   70 year old female who presented to the ED on 2/17 as a Code Stroke. PMH of ESRD on PD nightly (previously on HD), HTN, anxiety, depression, multiple thyroid nodules, right hip fracture secondary to mechanical fall in December 2021, and recent hospital admission for gastroenteritis (positive for C. diff) due to COVID-19 infection. Pt admitted with hyponatremia, hypokalemia, diarrhea, seizures.   Per Nephrology note, plan for all 1.5% dextrose tonight. Pt does not have a day dwell per notes.  Spoke with pt at bedside. Pt reports appetite is okay and that she feels like she is eating well. Pt states that she did eat some of a pimento cheese sandwich that her daughter brought her for lunch today. She has not been eating as much off of the meal trays. Noted ~50% completed breakfast and lunch meal trays in pt's room.  Pt states that her appetite has been slightly decreased for several weeks. She reports having nausea last week but states that this has resolved. Pt reports that she usually has an excellent appetite and that people comment on how much she eats and how thin she is.  Somewhat difficult to obtain reliable diet and weight history from pt. Pt seemed slightly confused. For example, pt told RD that her UBW is 104 lbs then later said she normally weighs 110 lbs. Will  attempt to obtain a more detailed diet and weight history upon follow-up.  Pt reports that she has tried oral nutrition supplements in the past but that they leave a weird taste in her mouth. She is willing to try drinking them during this admission after RD discussed increased kcal and protein needs related to healing. Pt reports that she does like chocolate milk and strawberry milk. RD will order chocolate milk to come with meal trays.  Admit weight: 51.2 kg Current weight: 60.2 kg  Weight is up 20 lbs since admission. If accurate, suspect weight gain related to edema. Difficult to determine dry weight at this time but suspect that pt has had some dry weight loss. Based on weight records, suspect dry weight around 50 kg.  Meal Completion: 5-40%  Medications reviewed and include: calcitriol, sensipar, aranesp, dificid, sodium bicarb, IV keppra  Labs reviewed: sodium 130, lactic acid, 3.0, hemoglobin 9.9  I/O's: +4.0 L since admit  NUTRITION - FOCUSED PHYSICAL EXAM:  Flowsheet Row Most Recent Value  Orbital Region Mild depletion  Upper Arm Region Moderate depletion  Thoracic and Lumbar Region Moderate depletion  Buccal Region No depletion  Temple Region Mild depletion  Clavicle Bone Region Severe depletion  Clavicle and Acromion Bone Region Severe depletion  Scapular Bone Region Moderate depletion  Dorsal Hand Moderate depletion  Patellar Region Moderate depletion  Anterior Thigh Region Moderate depletion  Posterior Calf Region Moderate depletion  Edema (RD Assessment) Mild  [BUE, BLE]  Hair Reviewed  Eyes Reviewed  Mouth Reviewed  Skin Reviewed  Nails Reviewed  Diet Order:   Diet Order            Diet renal with fluid restriction Fluid restriction: 1200 mL Fluid; Room service appropriate? Yes; Fluid consistency: Thin  Diet effective now                 EDUCATION NEEDS:   Education needs have been addressed  Skin:  Skin Assessment: Skin Integrity  Issues: Stage I: coccyx  Last BM:  06/21/20  Height:   Ht Readings from Last 1 Encounters:  06/20/20 5\' 4"  (1.626 m)    Weight:   Wt Readings from Last 1 Encounters:  06/23/20 60.2 kg    BMI:  Body mass index is 22.78 kg/m.  Estimated Nutritional Needs:   Kcal:  1650-1850  Protein:  85-100 grams  Fluid:  1000 ml + UOP    Gustavus Bryant, MS, RD, LDN Inpatient Clinical Dietitian Please see AMiON for contact information.

## 2020-06-23 NOTE — Progress Notes (Signed)
Pt's HR continues to increase to 130s-140s. Dr. Hal Hope informed. Orders to give 250 NS bolus and CT angio. Pt is still getting peritoneal dialysis treatment. Will continue to monitor.   Fransico [name], RN

## 2020-06-23 NOTE — Progress Notes (Signed)
Pt's HR has increased to 120s-130s. Pt is feeling anxious denies chest pain. MEWS changed to yellow. Dr. Hal Hope notified. Orders to obtain EKG, chest xray, and labs. Pt is resting. Will continue to monitor.   Fransico [name], RN

## 2020-06-24 ENCOUNTER — Inpatient Hospital Stay (HOSPITAL_COMMUNITY): Payer: Medicare Other

## 2020-06-24 DIAGNOSIS — R778 Other specified abnormalities of plasma proteins: Secondary | ICD-10-CM

## 2020-06-24 DIAGNOSIS — I5041 Acute combined systolic (congestive) and diastolic (congestive) heart failure: Secondary | ICD-10-CM | POA: Diagnosis not present

## 2020-06-24 DIAGNOSIS — R7989 Other specified abnormal findings of blood chemistry: Secondary | ICD-10-CM

## 2020-06-24 DIAGNOSIS — I10 Essential (primary) hypertension: Secondary | ICD-10-CM | POA: Diagnosis not present

## 2020-06-24 DIAGNOSIS — E871 Hypo-osmolality and hyponatremia: Secondary | ICD-10-CM | POA: Diagnosis not present

## 2020-06-24 LAB — LIPID PANEL
Cholesterol: 170 mg/dL (ref 0–200)
HDL: 88 mg/dL (ref >40)
LDL Cholesterol: 68 mg/dL (ref 0–99)
Total CHOL/HDL Ratio: 1.9 RATIO
Triglycerides: 68 mg/dL (ref <150)
VLDL: 14 mg/dL (ref 0–40)

## 2020-06-24 LAB — CBC WITH DIFFERENTIAL/PLATELET
Abs Immature Granulocytes: 0.07 10*3/uL (ref 0.00–0.07)
Basophils Absolute: 0.1 10*3/uL (ref 0.0–0.1)
Basophils Relative: 1 %
Eosinophils Absolute: 0.3 10*3/uL (ref 0.0–0.5)
Eosinophils Relative: 3 %
HCT: 29.8 % — ABNORMAL LOW (ref 36.0–46.0)
Hemoglobin: 10.2 g/dL — ABNORMAL LOW (ref 12.0–15.0)
Immature Granulocytes: 1 %
Lymphocytes Relative: 14 %
Lymphs Abs: 1.3 10*3/uL (ref 0.7–4.0)
MCH: 30 pg (ref 26.0–34.0)
MCHC: 34.2 g/dL (ref 30.0–36.0)
MCV: 87.6 fL (ref 80.0–100.0)
Monocytes Absolute: 0.8 10*3/uL (ref 0.1–1.0)
Monocytes Relative: 8 %
Neutro Abs: 6.7 10*3/uL (ref 1.7–7.7)
Neutrophils Relative %: 73 %
Platelets: 278 10*3/uL (ref 150–400)
RBC: 3.4 MIL/uL — ABNORMAL LOW (ref 3.87–5.11)
RDW: 18.1 % — ABNORMAL HIGH (ref 11.5–15.5)
WBC: 9.1 10*3/uL (ref 4.0–10.5)
nRBC: 0 % (ref 0.0–0.2)

## 2020-06-24 LAB — TYPE AND SCREEN
ABO/RH(D): O POS
Antibody Screen: NEGATIVE
Unit division: 0
Unit division: 0

## 2020-06-24 LAB — COMPREHENSIVE METABOLIC PANEL
ALT: 20 U/L (ref 0–44)
AST: 25 U/L (ref 15–41)
Albumin: 1.7 g/dL — ABNORMAL LOW (ref 3.5–5.0)
Alkaline Phosphatase: 90 U/L (ref 38–126)
Anion gap: 10 (ref 5–15)
BUN: 28 mg/dL — ABNORMAL HIGH (ref 8–23)
CO2: 22 mmol/L (ref 22–32)
Calcium: 7.9 mg/dL — ABNORMAL LOW (ref 8.9–10.3)
Chloride: 97 mmol/L — ABNORMAL LOW (ref 98–111)
Creatinine, Ser: 4.19 mg/dL — ABNORMAL HIGH (ref 0.44–1.00)
GFR, Estimated: 11 mL/min — ABNORMAL LOW (ref >60)
Glucose, Bld: 106 mg/dL — ABNORMAL HIGH (ref 70–99)
Potassium: 3.2 mmol/L — ABNORMAL LOW (ref 3.5–5.1)
Sodium: 129 mmol/L — ABNORMAL LOW (ref 135–145)
Total Bilirubin: 0.4 mg/dL (ref 0.3–1.2)
Total Protein: 3.9 g/dL — ABNORMAL LOW (ref 6.5–8.1)

## 2020-06-24 LAB — BPAM RBC
Blood Product Expiration Date: 202203172359
Blood Product Expiration Date: 202203202359
ISSUE DATE / TIME: 202202200247
Unit Type and Rh: 5100
Unit Type and Rh: 5100

## 2020-06-24 LAB — C-REACTIVE PROTEIN: CRP: 1.4 mg/dL — ABNORMAL HIGH (ref <1.0)

## 2020-06-24 LAB — MAGNESIUM: Magnesium: 1.7 mg/dL (ref 1.7–2.4)

## 2020-06-24 LAB — BRAIN NATRIURETIC PEPTIDE: B Natriuretic Peptide: 1625 pg/mL — ABNORMAL HIGH (ref 0.0–100.0)

## 2020-06-24 LAB — D-DIMER, QUANTITATIVE: D-Dimer, Quant: 1.95 ug/mL-FEU — ABNORMAL HIGH (ref 0.00–0.50)

## 2020-06-24 MED ORDER — SODIUM CHLORIDE 0.9 % IV SOLN: INTRAVENOUS | Status: DC

## 2020-06-24 MED ORDER — SODIUM CHLORIDE 0.9 % IV SOLN: 250.0000 mL | INTRAVENOUS | Status: DC | PRN

## 2020-06-24 MED ORDER — POTASSIUM CHLORIDE CRYS ER 20 MEQ PO TBCR
20.0000 meq | EXTENDED_RELEASE_TABLET | Freq: Once | ORAL | Status: AC
  Administered 2020-06-24: 20 meq via ORAL
  Filled 2020-06-24: qty 1

## 2020-06-24 MED ORDER — ASPIRIN 81 MG PO CHEW
81.0000 mg | CHEWABLE_TABLET | ORAL | Status: AC
  Administered 2020-06-25: 81 mg via ORAL
  Filled 2020-06-24: qty 1

## 2020-06-24 MED ORDER — SODIUM CHLORIDE 0.9% FLUSH: 3.0000 mL | INTRAVENOUS | Status: DC | PRN

## 2020-06-24 MED ORDER — SODIUM CHLORIDE 0.9 % IV BOLUS
250.0000 mL | Freq: Once | INTRAVENOUS | Status: AC
  Administered 2020-06-24: 250 mL via INTRAVENOUS

## 2020-06-24 MED ORDER — SODIUM CHLORIDE 0.9% FLUSH
3.0000 mL | Freq: Two times a day (BID) | INTRAVENOUS | Status: DC
  Administered 2020-06-24 – 2020-07-04 (×8): 3 mL via INTRAVENOUS

## 2020-06-24 NOTE — Progress Notes (Signed)
Bilateral lower extremity venous study completed.      Please see CV Proc for preliminary results.   Jaymison Luber, RVT  

## 2020-06-24 NOTE — TOC Transition Note (Addendum)
Transition of Care [facility]) - CM/SW Discharge Note   Patient Details  Name: Amy [name] MRN: [id number] Date of Birth: 22-Oct-1950  [facility]) CM/SW Contact:  Zenon Mayo, RN Phone Number: 06/24/2020, 12:38 PM   Clinical Narrative:    Patient is set up with HHPT with [facility], she has transportation at dc , she has no issues with getting medications. Patient states she does not want HHOT.  She has no other needs.   Final next level of care: [place] Barriers to Discharge: Continued Medical Work up   Patient Goals and CMS Choice Patient states their goals for this hospitalization and ongoing recovery are:: get better CMS Medicare.gov Compare Post Acute Care list provided to:: Patient Choice offered to / list presented to : Patient  Discharge Placement                       Discharge Plan and Services   Discharge Planning Services: CM Consult              DME Agency: NA       HH Arranged: PT [place] Agency: [place] ([facility]) Date HH Agency Contacted: 06/24/20 Time [place]: 1237 Representative spoke with at [place]: [place] ([place]) Interventions     Readmission Risk Interventions Readmission Risk Prevention Plan 06/24/2020  Transportation Screening Complete  PCP or Specialist Appt within 3-5 Days Complete  HRI or [place] Complete  Social Work Consult for [place] Planning/Counseling Complete  Palliative Care Screening Not Applicable  Medication Review Press photographer) Complete  Some recent data might be hidden

## 2020-06-24 NOTE — Progress Notes (Signed)
Nephrology Follow-Up Consult note   Assessment/Recommendations: VERDIE WILMS is a/an 70 y.o. female with a past medical history significant for ESRD on PD admitted with multiple electrolyte derangements, diarrhea, C. difficile, possible seizure.   # ESRD:  - PD started back 2/20 - all 1.5% again tonight - give 250 mL minibolus once now  # Volume/ hypertension:   - resumed 1.5% per pt's prior regimen  -minibolus as above  # Anemia of Chronic Kidney Disease:  improved. On ESA - aranesp 60 mcg every sat  # Secondary Hyperparathyroidism/Hyperphosphatemia: Continue calcitriol 0.5 MCG daily and Sensipar 30 mg daily.  #Hyponatremia: Hyponatremia.  Likely related to volume excess.  PD as above.  #Hypokalemia: replete  - potassium 20 meq once this am  #Acidosis: Mild metabolic acidosis likely related to diarrhea.    On oral bicarbonate   #Possible seizure: Receiving Keppra.    Neurology has been consulted  # Elevated troponin - per cardiology.  Transitioned back to standard gentle UF per pt's normal regimen; on chart review note had a more aggressive regimen Sunday after missing PD   ________________________________________________________  CC: ESRD  Interval History/Subjective: had PD overnight.  Had 150 ml UOP over 2/21.  She was seen by cardiology for elevated troponin and tachycardia.  Note she had 2.9 liters UF on 2/21 am with PD.  Feels well.  Not sure of dispo.  Review of systems:    Denies shortness of breath or chest pain  Denies n/v  Medications:  Current Facility-Administered Medications  Medication Dose Route Frequency Provider Last Rate Last Admin  . 0.9 %  sodium chloride infusion (Manually program via Guardrails IV Fluids)   Intravenous Once Thurnell Lose, MD   Held at 06/22/20 [phone]  . acetaminophen (TYLENOL) tablet 650 mg  650 mg Oral Q6H PRN Norins, Heinz Knuckles, MD      . aspirin EC tablet 81 mg  81 mg Oral Daily Rise Patience, MD      .  butalbital-acetaminophen-caffeine (FIORICET) [phone] MG per tablet 1-2 tablet  1-2 tablet Oral Q8H PRN Thurnell Lose, MD   2 tablet at 06/23/20 1522  . calcitRIOL (ROCALTROL) capsule 0.5 mcg  0.5 mcg Oral Daily Reesa Chew, MD   0.5 mcg at 06/23/20 8295  . Chlorhexidine Gluconate Cloth 2 % PADS 6 each  6 each Topical Daily Norins, Heinz Knuckles, MD   6 each at 06/23/20 1246  . cinacalcet (SENSIPAR) tablet 30 mg  30 mg Oral Q breakfast Reesa Chew, MD   30 mg at 06/23/20 6213  . Darbepoetin Alfa (ARANESP) injection 60 mcg  60 mcg Subcutaneous Q Sat-1800 Reesa Chew, MD      . feeding supplement (NEPRO CARB STEADY) liquid 237 mL  237 mL Oral BID BM Thurnell Lose, MD   237 mL at 06/23/20 1424  . fidaxomicin (DIFICID) tablet 200 mg  200 mg Oral BID Neena Rhymes, MD   200 mg at 06/23/20 2122  . gentamicin cream (GARAMYCIN) 0.1 % 1 application  1 application Topical Daily Reesa Chew, MD      . Gerhardt's butt cream   Topical PRN Thurnell Lose, MD   Given at 06/22/20 1616  . heparin 2,500 Units in dialysis solution 2.5% low-MG/low-CA 5,000 mL dialysis solution   Peritoneal Dialysis PRN Reesa Chew, MD      . heparin injection 5,000 Units  5,000 Units Subcutaneous Q8H Norins, Heinz Knuckles, MD   5,000 Units  at 06/24/20 0516  . levETIRAcetam (KEPPRA) IVPB 500 mg/100 mL premix  500 mg Intravenous Q12H Perfecto Kingdom, MD 400 mL/hr at 06/24/20 0516 500 mg at 06/24/20 0516  . metoprolol tartrate (LOPRESSOR) tablet 25 mg  25 mg Oral BID Thurnell Lose, MD   25 mg at 06/23/20 2122  . multivitamin (RENA-VIT) tablet 1 tablet  1 tablet Oral QHS Thurnell Lose, MD   1 tablet at 06/23/20 2123  . sodium bicarbonate tablet 650 mg  650 mg Oral TID Thurnell Lose, MD   650 mg at 06/23/20 2122  . sodium chloride flush (NS) 0.9 % injection 3 mL  3 mL Intravenous Once Carmin Muskrat, MD      . traZODone (DESYREL) tablet 25 mg  25 mg Oral QHS PRN Neena Rhymes, MD   25 mg  at 06/23/20 2121      Physical Exam: Vitals:   06/23/20 2300 06/24/20 0300  BP: 98/69 95/70  Pulse: 86 89  Resp: 16 17  Temp: 98 F (36.7 C) 97.8 F (36.6 C)  SpO2: 97% 96%   No intake/output data recorded.  Intake/Output Summary (Last 24 hours) at 06/24/2020 0622 Last data filed at 06/23/2020 1800 Gross per 24 hour  Intake 340 ml  Output 150 ml  Net 190 ml    General adult female in bed in no acute distress  HEENT normocephalic atraumatic extraocular movements intact sclera anicteric Neck supple trachea midline Lungs clear to auscultation bilaterally normal work of breathing at rest  Heart S1s2 no rub Abdomen soft nontender nondistended Extremities no edema  Psych normal mood and affect Neuro alert and oriented x 3 provides hx and follows commands   Test Results I personally reviewed new and old clinical labs and radiology tests Lab Results  Component Value Date   NA 129 (L) 06/24/2020   K 3.2 (L) 06/24/2020   CL 97 (L) 06/24/2020   CO2 22 06/24/2020   BUN 28 (H) 06/24/2020   CREATININE 4.19 (H) 06/24/2020   GLU 103 04/30/2014   CALCIUM 7.9 (L) 06/24/2020   ALBUMIN 1.7 (L) 06/24/2020   PHOS 6.1 (H) 11/08/2018     Outpt prescription Access  PD Catheter Right lower quadrant   Schedule  QD   PD Type  PD with Cycler   Vendor  [id number]   Calcium  2.50 mEq   Magnesium  0.5 mEq   Dextrose  Varied   Dextrose Instructions  Dextrose strength dependant on BP, weight, and edema.   Total Fills: Dry (Checked)  4   Total Fills: Dry (Checked) Details  4 Fills per Cycle @ 2000 ml   Cycle  Day   Cycle Drain Time  20 min   Cycle Fill Time  10 min   Cycle Dwell Time  1 hrs 49 min        Claudia Desanctis, MD 06/24/2020 6:35 AM

## 2020-06-24 NOTE — Progress Notes (Signed)
Progress Note  Patient Name: Amy [name] Date of Encounter: 06/24/2020  [facility] Cardiologist: Ida Rogue, MD   Subjective   Feeling very tired.  Denies chest pain.  Breathing is stable.  No longer having diarrhea.  Inpatient Medications    Scheduled Meds: . sodium chloride   Intravenous Once  . aspirin EC  81 mg Oral Daily  . calcitRIOL  0.5 mcg Oral Daily  . Chlorhexidine Gluconate Cloth  6 each Topical Daily  . cinacalcet  30 mg Oral Q breakfast  . darbepoetin (ARANESP) injection - NON-DIALYSIS  60 mcg Subcutaneous Q Sat-1800  . feeding supplement (NEPRO CARB STEADY)  237 mL Oral BID BM  . fidaxomicin  200 mg Oral BID  . gentamicin cream  1 application Topical Daily  . heparin  5,000 Units Subcutaneous Q8H  . metoprolol tartrate  25 mg Oral BID  . multivitamin  1 tablet Oral QHS  . sodium bicarbonate  650 mg Oral TID  . sodium chloride flush  3 mL Intravenous Once   Continuous Infusions: . levETIRAcetam Stopped (06/24/20 0531)   PRN Meds: acetaminophen **OR** [DISCONTINUED] acetaminophen, butalbital-acetaminophen-caffeine, Gerhardt's butt cream, dianeal solution for CAPD/[name] with heparin, traZODone   Vital Signs    Vitals:   06/24/20 0300 06/24/20 0500 06/24/20 0800 06/24/20 0920  BP: 95/70  109/73 95/66  Pulse: 89   (!) 103  Resp: 17  14 (!) 24  Temp: 97.8 F (36.6 C)  (P) 98.5 F (36.9 C) 98.6 F (37 C)  TempSrc: Oral  (P) Oral Oral  SpO2: 96%  (P) 97% 90%  Weight:  55.9 kg    Height:        Intake/Output Summary (Last 24 hours) at 06/24/2020 1003 Last data filed at 06/24/2020 0600 Gross per 24 hour  Intake 562 ml  Output 150 ml  Net 412 ml   Last 3 Weights 06/24/2020 06/23/2020 06/23/2020  Weight (lbs) 123 lb 3.8 oz 122 lb 9.2 oz 132 lb 11.5 oz  Weight (kg) 55.9 kg 55.6 kg 60.2 kg      Telemetry    Sinus rhythm, sinus tachycardia- Personally Reviewed  ECG    N/A- Personally Reviewed  Physical Exam   VS:  BP 95/66 (BP Location:  Left Arm)   Pulse (!) 103   Temp 98.6 F (37 C) (Oral)   Resp (!) 24   Ht 5\' 4"  (1.626 m)   Wt 55.9 kg   SpO2 90%   BMI 21.15 kg/m  , BMI Body mass index is 21.15 kg/m. GENERAL: Chronically ill-appearing.  No acute distress. HEENT: Pupils equal round and reactive, fundi not visualized, oral mucosa unremarkable.  Cushingoid face NECK:  No jugular venous distention, waveform within normal limits, carotid upstroke brisk and symmetric LUNGS:  Clear to auscultation bilaterally HEART:  RRR.  PMI not displaced or sustained,S1 and S2 within normal limits, no S3, no S4, no clicks, no rubs, no murmurs ABD:  Flat, positive bowel sounds normal in frequency in pitch, no bruits, no rebound, no guarding, no midline pulsatile mass, no hepatomegaly, no splenomegaly EXT:  2 plus pulses throughout, +L UE edema.  No LE edema, no cyanosis no clubbing SKIN:  No rashes no nodules NEURO:  Cranial nerves II through XII grossly intact, motor grossly intact throughout PSYCH:  Cognitively intact, oriented to person place and time   Labs    High Sensitivity Troponin:   Recent Labs  Lab 05/29/20 1215 06/23/20 0454 06/23/20 0652 06/23/20 0930  TROPONINIHS 25* 1,978* 1,554* 1,397*      Chemistry Recent Labs  Lab 06/22/20 0139 06/23/20 0057 06/24/20 0102  NA 126* 130* 129*  K 4.6 4.0 3.2*  CL 96* 100 97*  CO2 19* 18* 22  GLUCOSE 90 193* 106*  BUN 38* 36* 28*  CREATININE 5.26* 5.04* 4.19*  CALCIUM 7.8* 8.0* 7.9*  PROT 3.5* 4.1* 3.9*  ALBUMIN 1.7* 1.9* 1.7*  AST 29 33 25  ALT 20 23 20   ALKPHOS 82 90 90  BILITOT 0.5 0.4 0.4  GFRNONAA 8* 9* 11*  ANIONGAP 11 12 10      Hematology Recent Labs  Lab 06/22/20 1131 06/23/20 0057 06/24/20 0102  WBC 14.1* 9.3 9.1  RBC 3.30* 3.22* 3.40*  HGB 10.1* 9.9* 10.2*  HCT 28.0* 27.9* 29.8*  MCV 84.8 86.6 87.6  MCH 30.6 30.7 30.0  MCHC 36.1* 35.5 34.2  RDW 17.2* 17.8* 18.1*  PLT 307 263 278    BNP Recent Labs  Lab 06/22/20 0139 06/23/20 0057  06/24/20 0102  BNP 269.5* 1,142.5* 1,625.0*     DDimer  Recent Labs  Lab 06/22/20 2216 06/23/20 0057 06/24/20 0102  DDIMER 1.64* 1.98* 1.95*     Radiology    CT ANGIO CHEST PE W OR WO CONTRAST  Result Date: 06/23/2020 CLINICAL DATA:  Tachycardia. Elevated D-dimer. Pulmonary embolism suspected. Recent right hip ORIF. EXAM: CT ANGIOGRAPHY CHEST WITH CONTRAST TECHNIQUE: Multidetector CT imaging of the chest was performed using the standard protocol during bolus administration of intravenous contrast. Multiplanar CT image reconstructions and MIPs were obtained to evaluate the vascular anatomy. CONTRAST:  72mL OMNIPAQUE IOHEXOL 350 MG/ML SOLN COMPARISON:  CT 04/06/2017, x-ray 06/22/2020 FINDINGS: Cardiovascular: Satisfactory opacification of the pulmonary arteries to the segmental level. No evidence of pulmonary embolism. Thoracic aorta is nonaneurysmal. Scattered atherosclerotic calcification of the aorta and coronary arteries. Normal heart size. No pericardial effusion. Mediastinum/Nodes: No axillary, mediastinal, or hilar lymphadenopathy. Trachea and esophagus within normal limits. Redemonstration of multiple thyroid lobe nodules measuring up to 1.5 cm in the right thyroid lobe and 1.7 cm in the region of the thyroid isthmus. Isthmus nodule has slightly increased in size from prior CT. Lungs/Pleura: Small left and trace right pleural effusions with associated compressive atelectasis. Lungs are otherwise clear. No pneumothorax. Upper Abdomen: Small volume ascites within the visualized upper abdomen. A small portion of the colon is visualized in the region of the splenic flexure with associated colonic wall thickening (series 5, image 92). Musculoskeletal: Mild anasarca.  No acute osseous findings. Review of the MIP images confirms the above findings. IMPRESSION: 1. Negative for pulmonary embolism. 2. Findings of volume overload with bilateral pleural effusions, ascites, and anasarca. 3. A small  portion of the colon is visualized in the region of the splenic flexure with associated colonic wall thickening. Findings suggestive of colitis in the setting of known Clostridium difficile infection. 4. Redemonstration of multiple thyroid lobe nodules. Isthmus nodule has slightly increased in size from prior CT. Recommend thyroid US (ref: J Am Coll Radiol. 2015 Feb;12(2): 143-50). 5. Aortic and coronary artery atherosclerosis (ICD10-I70.0). Electronically Signed   By: Davina Poke D.O.   On: 06/23/2020 11:26   DG CHEST PORT 1 VIEW  Result Date: 06/22/2020 CLINICAL DATA:  Shortness of breath EXAM: PORTABLE CHEST 1 VIEW COMPARISON:  Radiograph 05/29/2020 FINDINGS: There are some chronically coarsened interstitial and bronchitic features which are slightly accentuated in the bases possibly related to low volumes and atelectatic change. Blunting of left costophrenic sulcus, possibly scarring  versus a trace effusion. Pulmonary vascularity remains fairly well-defined. Cardiomediastinal contours are unremarkable. The osseous structures appear diffusely demineralized which may limit detection of small or nondisplaced fractures. No acute osseous abnormality or suspicious osseous lesion. Remaining soft tissues are unremarkable. Telemetry leads overlie the chest. IMPRESSION: 1. Chronic interstitial and bronchitic features are slightly accentuated in the bases, likely related to low volumes and atelectatic change. 2. Blunting of the left costophrenic sulcus, possibly scarring versus a trace effusion. Electronically Signed   By: Lovena Le M.D.   On: 06/22/2020 22:24   ECHOCARDIOGRAM COMPLETE  Result Date: 06/23/2020    ECHOCARDIOGRAM REPORT   Patient Name:   Amy [name] [facility] Date of Exam: 06/23/2020 Medical Rec #:  [id number]     Height:       64.0 in Accession #:    [id number]    Weight:       132.7 lb Date of Birth:  06-08-1950     BSA:          1.643 m Patient Age:    4 years      BP:           102/70 mmHg Patient  Gender: F             HR:           106 bpm. Exam Location:  Inpatient Procedure: 2D Echo Indications:    elevated troponin  History:        Patient has prior history of Echocardiogram examinations, most                 recent 04/04/2019. End stage renal disease; Risk                 Factors:Hypertension and Dyslipidemia.  Sonographer:    [name] Referring Phys: [place]  1. LV function is severerly depressed The very proximal portion of LV is vigorous. There is akinesis of the distal lateral, mid/distal septal, mid/dital inferior, distal anterior and apical walls are akinetic. Compared to echo report from Dec, 2020 wall  motion abnormalities and depressed LVEF are new.. Left ventricular ejection fraction, by estimation, is 25 to 30%. The left ventricle has severely decreased function. Left ventricular diastolic parameters are indeterminate.  2. Akinesis of the apex.. Right ventricular systolic function is mildly reduced. The right ventricular size is normal. There is normal pulmonary artery systolic pressure.  3. The mitral valve is abnormal. Mild mitral valve regurgitation.  4. The aortic valve is abnormal. Aortic valve regurgitation is not visualized. Mild aortic valve sclerosis is present, with no evidence of aortic valve stenosis. FINDINGS  Left Ventricle: LV function is severerly depressed The very proximal portion of LV is vigorous. There is akinesis of the distal lateral, mid/distal septal, mid/dital inferior, distal anterior and apical walls are akinetic. Compared to echo report from Dec, 2020 wall motion abnormalities and depressed LVEF are new. Left ventricular ejection fraction, by estimation, is 25 to 30%. The left ventricle has severely decreased function. The left ventricular internal cavity size was normal in size. There is no  left ventricular hypertrophy. Left ventricular diastolic parameters are indeterminate. Right Ventricle: Akinesis of the apex. The right  ventricular size is normal. Right vetricular wall thickness was not assessed. Right ventricular systolic function is mildly reduced. There is normal pulmonary artery systolic pressure. The tricuspid regurgitant velocity is 2.71 m/s, and with an assumed right atrial pressure of 3 mmHg, the estimated right ventricular systolic pressure is  32.4 mmHg. Left Atrium: Left atrial size was normal in size. Right Atrium: Right atrial size was normal in size. Pericardium: There is no evidence of pericardial effusion. Mitral Valve: The mitral valve is abnormal. There is mild thickening of the mitral valve leaflet(s). Mild mitral annular calcification. Mild mitral valve regurgitation. Tricuspid Valve: The tricuspid valve is normal in structure. Tricuspid valve regurgitation is trivial. Aortic Valve: The aortic valve is abnormal. Aortic valve regurgitation is not visualized. Mild aortic valve sclerosis is present, with no evidence of aortic valve stenosis. Pulmonic Valve: The pulmonic valve was normal in structure. Pulmonic valve regurgitation is not visualized. Aorta: The aortic root is normal in size and structure. IAS/Shunts: The interatrial septum was not assessed.  LEFT VENTRICLE PLAX 2D LVIDd:         5.10 cm     Diastology LVIDs:         4.00 cm     LV e' lateral: 4.24 cm/s LV PW:         1.00 cm LV IVS:        0.80 cm LVOT diam:     1.80 cm LV SV:         43 LV SV Index:   26 LVOT Area:     2.54 cm  LV Volumes (MOD) LV vol d, MOD A2C: 64.3 ml LV vol d, MOD A4C: 90.6 ml LV vol s, MOD A2C: 48.8 ml LV vol s, MOD A4C: 69.1 ml LV SV MOD A2C:     15.5 ml LV SV MOD A4C:     90.6 ml LV SV MOD BP:      20.1 ml RIGHT VENTRICLE             IVC RV S prime:     13.30 cm/s  IVC diam: 1.20 cm TAPSE (M-mode): 1.4 cm LEFT ATRIUM             Index       RIGHT ATRIUM          Index LA diam:        3.30 cm 2.01 cm/m  RA Area:     8.93 cm LA Vol (A2C):   79.4 ml 48.31 ml/m RA Volume:   15.60 ml 9.49 ml/m LA Vol (A4C):   42.0 ml 25.56  ml/m LA Biplane Vol: 64.2 ml 39.06 ml/m  AORTIC VALVE LVOT Vmax:   91.40 cm/s LVOT Vmean:  63.500 cm/s LVOT VTI:    0.170 m  AORTA Ao Root diam: 3.20 cm Ao Asc diam:  3.20 cm TRICUSPID VALVE TR Peak grad:   29.4 mmHg TR Vmax:        271.00 cm/s  SHUNTS Systemic VTI:  0.17 m Systemic Diam: 1.80 cm Dorris Carnes MD Electronically signed by Dorris Carnes MD Signature Date/Time: 06/23/2020/7:19:28 PM    Final     Cardiac Studies   Echo 06/23/20: 1. LV function is severerly depressed The very proximal portion of LV is  vigorous. There is akinesis of the distal lateral, mid/distal septal,  mid/dital inferior, distal anterior and apical walls are akinetic.  Compared to echo report from Dec, 2020 wall  motion abnormalities and depressed LVEF are new.. Left ventricular  ejection fraction, by estimation, is 25 to 30%. The left ventricle has  severely decreased function. Left ventricular diastolic parameters are  indeterminate.  2. Akinesis of the apex.. Right ventricular systolic function is mildly  reduced. The right ventricular size is normal. There is normal pulmonary  artery systolic pressure.  3. The mitral valve is abnormal. Mild mitral valve regurgitation.  4. The aortic valve is abnormal. Aortic valve regurgitation is not  visualized. Mild aortic valve sclerosis is present, with no evidence of  aortic valve stenosis.   Patient Profile     Ms. Keough is a 20F with ESRD on PD, HTN, anemia of chronic disease, and atrial flutter admitted with seizure in the setting of profound metabolic derangements.  She has been treated for C. Diff but had refractory diarrhea.  She was also recently COVID-19 positive.  Her husband brought her to the hospital with seizure like activity and weakness.  Cardiology was consulted for elevated troponin and tachycardia.      Assessment & Plan    # Elevated troponin:  HS-troponin was elevated to 1978-->1554-->1397.  She denies chest pain.  During this hospitalization  she was also noted to have an elevated D-dimer.  Chest CT-A was negative for pulmonary embolism.  It did reveal coronary calcification. Ultimately, she would benefit from cardiac catheterization to rule out obstructive coronary disease.  She is also had worsening anemia this admission requiring transfusion.    H/H has been stable since that time.  Likely due to anemia of chronic disease.  Nevertheless, she is not a great candidate for PCI at this time.  Given her extensive calcification noted on CT, elevated cardiac enzymes and wall motion abnormalities on echo, recommend getting a diagnostic cath tomorrow.  She and her husband are in agreement with this plan.    Risks and benefits of cardiac catheterization have been discussed with the patient.  The patient understands that risks included but are not limited to stroke (1 in 1000), death (1 in 65), kidney failure [usually temporary] (1 in 500), bleeding (1 in 200), allergic reaction [possibly serious] (1 in 200). The patient understands and agrees to proceed.    # Acute systolic and diastolic heart failure: Echo concerning for stress cardiomyopathy.  Plan for [facility] tomorrow to rule out obstructive CAD.  Consolidate metoprolol to succinate.  Resume home ARB when BP allows.   # Sinus tachycardia: This admission she has persistent sinus tachycardia, but no atrial flutter has been detected.  Her tachycardia will likely resolve as her underlying disease improves.  Consolidate metoprolol as above.    # Atrial flutter: Ms. Canny has a history of atrial flutter 04/2019.  This was thought to be due to acute illness and the time and she was not treated on longstanding anticoagulation due to her anemia.         For questions or updates, please contact [place] Please consult [email or web address] for contact info under        Signed, Skeet Latch, MD  06/24/2020, 10:03 AM

## 2020-06-24 NOTE — Progress Notes (Signed)
Occupational Therapy Treatment Patient Details Name: Amy [name] MRN: [id number] DOB: [birth year] Today's Date: 06/24/2020    History of present illness ALIENA GHRIST is a 70 y.o. female with medical history significant of ESRD on PD, HTN, multiple thyroid nodules. She had a hip fracture in December s/p ORIF. Recently hospitalized 1/25/-05/29/20 for watery diarrhea, hyponatremia, hypokalemia. She was found to be covid 19 positive but was not treated with no signs of respiratory involvement. had questionable seizure activity and was transfered to [facility] from [facility].   OT comments  Pt progressing towards OT goals this session, educated Pt and husband on energy conservation strategies. Pt able to perform seated grooming tasks with set up and then transfer at min A (face to face). OT will continue to follow acutely and POC remains appropriate.    Follow Up Recommendations  Home health OT;Supervision/Assistance - 24 hour    Equipment Recommendations  None recommended by OT    Recommendations for Other Services      Precautions / Restrictions Precautions Precautions: Fall Precaution Comments: weakness, peritoneal diaylisis, swollen arms Restrictions Weight Bearing Restrictions: No       Mobility Bed Mobility Overal bed mobility: Needs Assistance Bed Mobility: Sit to Supine       Sit to supine: Mod assist   General bed mobility comments: mod A to manage BLE back into bed    Transfers Overall transfer level: Needs assistance Equipment used: 1 person hand held assist Transfers: Sit to/from Stand;Stand Pivot Transfers Sit to Stand: Min assist;From elevated surface         General transfer comment: pt able to power up with min guard requires min A for steadying    Balance Overall balance assessment: Needs assistance Sitting-balance support: Feet supported;No upper extremity supported Sitting balance-Leahy Scale: Fair     Standing balance support: Single extremity  supported Standing balance-Leahy Scale: Poor Standing balance comment: dependent on external support, decreased confidence                           ADL either performed or assessed with clinical judgement   ADL Overall ADL's : Needs assistance/impaired     Grooming: Wash/dry hands;Wash/dry face;Set up;Sitting Grooming Details (indicate cue type and reason): in recliner                 Toilet Transfer: Minimal assistance;Stand-pivot;BSC Toilet Transfer Details (indicate cue type and reason): one person face to face transfer Toileting- Clothing Manipulation and Hygiene: Maximal assistance;Sit to/from stand Toileting - Clothing Manipulation Details (indicate cue type and reason): Pt able to stand while OT performed gentle peri care     Functional mobility during ADLs: Minimal assistance (face to face transfer Pt taking small steps)       Vision       Perception     Praxis      Cognition Arousal/Alertness: Awake/alert Behavior During Therapy: WFL for tasks assessed/performed Overall Cognitive Status: Within Functional Limits for tasks assessed                                 General Comments: husband present and more motivated than previous session        Exercises     Shoulder Instructions       General Comments Pt's husband present throughout session    Pertinent Vitals/ Pain       Pain Assessment: Faces  Faces Pain Scale: Hurts little more Pain Location: buttocks, from freq episodes of diarrhea Pain Descriptors / Indicators: Sore Pain Intervention(s): Limited activity within patient's tolerance;Monitored during session;Repositioned  Home Living                                          Prior Functioning/Environment              Frequency  Min 2X/week        Progress Toward Goals  OT Goals(current goals can now be found in the care plan section)  Progress towards OT goals: Progressing toward  goals  Acute Rehab OT Goals Patient Stated Goal: be able to cook again OT Goal Formulation: With patient/family Time For Goal Achievement: 07/05/20 Potential to Achieve Goals: Good  Plan Discharge plan remains appropriate;Frequency remains appropriate    Co-evaluation                 AM-PAC OT "6 Clicks" Daily Activity     Outcome Measure   Help from another person eating meals?: A Little Help from another person taking care of personal grooming?: A Little Help from another person toileting, which includes using toliet, bedpan, or urinal?: A Lot Help from another person bathing (including washing, rinsing, drying)?: A Lot Help from another person to put on and taking off regular upper body clothing?: A Lot Help from another person to put on and taking off regular lower body clothing?: A Lot 6 Click Score: 14    End of Session    OT Visit Diagnosis: Unsteadiness on feet (R26.81);Other abnormalities of gait and mobility (R26.89);Muscle weakness (generalized) (M62.81);Pain Pain - part of body:  (bottom)   Activity Tolerance Patient tolerated treatment well   Patient Left in bed;with call bell/phone within reach;Other (comment) (ultrasound in room)   Nurse Communication Mobility status;Other (comment) (check telemetry sensors)        Time: 8264-1583 OT Time Calculation (min): 47 min  Charges: OT General Charges $OT Visit: 1 Visit OT Treatments $Self Care/Home Management : 23-37 mins $Therapeutic Activity: 8-22 mins  Jesse Sans OTR/L Acute Rehabilitation Services Pager: [phone] Office: [place] 06/24/2020, 1:32 PM

## 2020-06-24 NOTE — TOC Initial Note (Signed)
Transition of Care [facility]) - Initial/Assessment Note    Patient Details  Name: Amy [name] MRN: [id number] Date of Birth: [birth year]  Transition of Care [facility]) CM/SW Contact:    Zenon Mayo, RN Phone Number: 06/24/2020, 12:32 PM  Clinical Narrative:                 NCM spoke with patient, she is from home with spouse, NCM offered choice for HHPT, she states she has had [facility] in the past and she like them, so wants this NCM to refer to them.  NCM made referral to [facility] with with [facility] , she is able to take referral. Soc will begin 24 to 48 hrs post dc.   Expected Discharge Plan: [place] Barriers to Discharge: Continued Medical Work up   Patient Goals and CMS Choice Patient states their goals for this hospitalization and ongoing recovery are:: get better CMS Medicare.gov Compare Post Acute Care list provided to:: Patient Choice offered to / list presented to : Patient  Expected Discharge Plan and Services Expected Discharge Plan: [place]   Discharge Planning Services: CM Consult   Living arrangements for the past 2 months: Single Family Home                           HH Arranged: PT [facility] Agency: [place] ([place]) Date HH Agency Contacted: 06/24/20 Time HH Agency Contacted: 52 Representative spoke with at [place]: [facility] Arrangements/Services Living arrangements for the past 2 months: [place] with:: Spouse Patient language and need for interpreter reviewed:: Yes Do you feel safe going back to the place where you live?: Yes      Need for Family Participation in Patient Care: Yes (Comment) Care giver support system in place?: Yes (comment)   Criminal Activity/Legal Involvement Pertinent to Current Situation/Hospitalization: No - Comment as needed  Activities of Daily Living      Permission Sought/Granted                  Emotional Assessment    Attitude/Demeanor/Rapport: Engaged Affect (typically observed): Appropriate Orientation: : Oriented to Self,Oriented to Place,Oriented to  Time,Oriented to Situation Alcohol / Substance Use: Not Applicable Psych Involvement: No (comment)  Admission diagnosis:  Hypokalemia [E87.6] Hyponatremia [E87.1] Seizure ([place]) [R56.9] C. difficile colitis [A04.72] Diarrhea, unspecified type [R19.7] Patient Active Problem List   Diagnosis Date Noted  . Protein-calorie malnutrition, severe 06/23/2020  . Non-ST elevation (NSTEMI) myocardial infarction ([place])   . Pressure injury of skin 06/21/2020  . C. difficile diarrhea 06/19/2020  . C. difficile colitis 06/19/2020  . Seizure ([place]) 06/19/2020  . Gastroenteritis due to COVID-19 virus 05/29/2020  . Surgery, elective   . S/P ORIF (open reduction internal fixation) fracture   . Closed right hip fracture, initial encounter ([place]) 04/22/2020  . Hypotension 12/16/2019  . ESRD on dialysis ([place]) 12/16/2019  . Depression with anxiety 12/16/2019  . Hypomagnesemia 12/16/2019  . Atrial flutter ([place])   . Atrial fibrillation with RVR ([place]) 04/03/2019  . Benign essential HTN 04/03/2019  . HLD (hyperlipidemia) 04/03/2019  . End stage renal disease ([place]) 12/13/2018  . Itching 11/08/2018  . Hyperparathyroidism, secondary renal ([place]) 09/13/2017  . Ground glass opacity present on imaging of lung 04/08/2017  . Osteoarthritis of knee 12/29/2016  . Normocytic anemia 10/14/2016  . Intractable episodic headache   . Gastritis   . History of  adenomatous polyp of colon 09/07/2016  . Iron deficiency anemia 09/05/2016  . Hyponatremia 09/05/2016  . Multiple thyroid nodules 07/04/2015  . Right thyroid nodule 06/27/2015  . Allergic rhinitis, seasonal 12/03/2014  . Fever blister 12/03/2014  . Herniated lumbar intervertebral disc 12/03/2014  . Hypokalemia 12/03/2014  . Recurrent sinus infections 12/03/2014  . Insomnia 11/05/2014  . Acquired scoliosis 06/26/2014  .  Intervertebral disc stenosis of neural canal of lumbar region 06/20/2014  . Lumbar facet arthropathy 05/21/2014  . Degenerative spondylolisthesis 03/12/2014  . OP (osteoporosis) 08/10/2013  . Idiopathic peripheral neuropathy 03/24/2009  . Edema 12/27/2007  . Headache, tension-type 10/25/2006  . Essential hypertension 10/24/2006  . Anxiety 05/03/1998  . Esophageal reflux 05/03/1998   PCP:  [name], MD Pharmacy:   [facility] [phone] [address], [address] [place] [address] Phone: [phone] Fax: [phone]  [id number] [place], [place] [address] [place] [address] Phone: [phone] Fax: [phone]  [id number] - [place], [place] [address] [place] [address] Phone: [phone] Fax: [place], [place] [address] Phone: [phone] Fax: [phone]  [id number] - [place], [address] [place] [address] Phone: [phone] Fax: [phone]  [facility] [place], [place] [address] [place] [address] Phone: [phone] Fax: [phone]     Social Determinants of Health (SDOH) Interventions    Readmission Risk Interventions Readmission Risk Prevention Plan 06/24/2020  Transportation Screening Complete  PCP or Specialist Appt within 3-5 Days Complete  HRI or Home Care Consult Complete  Social Work Consult for [place] Planning/Counseling Complete  Palliative Care Screening Not Applicable  Medication Review Press photographer) Complete  Some recent data might be hidden

## 2020-06-24 NOTE — Progress Notes (Addendum)
PROGRESS NOTE                                                                                                                                                                                                             Patient Demographics:    Amy [name], is a 70 y.o. female, DOB - 1950/11/19, YYQ:[id number]  Outpatient Primary MD for the patient is [name], MD    LOS - 5  Admit date - 06/19/2020    Chief Complaint  Patient presents with  . Code Stroke       Brief Narrative (HPI from H&P)  Amy [name] is a 70 y.o. female with medical history significant of ESRD on PD, HTN, multiple thyroid nodules. She had a hip fracture in December s/p ORIF. Recently hospitalized 1/25/-05/29/20 for watery diarrhea, hyponatremia, hypokalemia. She was found to be covid 19 positive but was not treated with no signs of respiratory involvement. She did test positive for C. Diff by PCR and was started on oral Vancomycin with a 9 day Rx at discharge which she was unable to get filled. By her report she was treated with clindamycin. She has continued to have copious watery diarrhea with increased weakness. She was observed to have a very brief episode of rightward gaze with slumped head that lasted approximately 1 min. EMS was activated and transported the patient to [facility] for evaluation.  Her work-up was consistent with severe dehydration caused by C. difficile diarrhea with severe hypokalemia, hypomagnesemia, possible seizures and she was admitted.  She also developed sinus tachycardia with NSTEMI for which she was seen by cardiology and is due for left heart catheterization soon this admission.    Subjective:   Patient in bed, appears comfortable, denies any headache, no fever, no chest pain or pressure, no shortness of breath , no abdominal pain. No focal weakness.    Assessment  & Plan :     1. Seizure with postictal state caused by  severe electrolyte abnormalities including hyponatremia, hypomagnesemia, hypokalemia caused by C. difficile diarrhea - MRI negative, EEG shows postictal state, seen by neurology and nephrology.  Potassium and magnesium now stable after aggressive replacement, has been hydrated with IV fluids, on Keppra and currently seems to be stabilizing. Per Neuro DC on Keppra with Neuro follow up, continue PT OT to monitor.  2.  C. difficile infection present on admission.  Being treated with Dificid as she seems to have failed 10-day course of vancomycin.  Note C. difficile treatment with Dificid will be continued post discharge, Dificid for home has been arranged by [facility] inpatient pharmacy, with the assistant of [facility] ID pharmacist.  3.  Recent COVID-19 incidental infection.  Supportive care.  4.  ESRD (22yrs) .  On PD.  Renal following.  To be resumed, oral bicarb continued for metabolic acidosis Case discussed with nephrologist Dr. Osborne Casco on 06/22/2020.  5.  Anemia of chronic disease.  Anemia panel suggestive of chronic disease, no signs of active bleeding, 1 unit of packed RBC transfusion on 06/22/2020 we will continue to monitor.  6.  Episode of sinus tachycardia night of 06/22/2020, symptom-free, EKG nonacute, TSH stable, but elevated troponin suspicious of NSTEMI.  CTA chest and leg ultrasound unremarkable, echocardiogram shows a depressed EF of 25% with typical wall motion abnormalities, cardiology on board currently on aspirin and beta-blocker, currently symptom-free.  Defer further management to cardiology who is contemplating left heart cath likely in the next few days.  7.  Chronic systolic CHF with EF 25 to 30%.  Defer management to cardiology.  Fluid removal through PD.      Condition - Guarded  Family Communication  : Wayna Chalet [phone]  on 06/20/2020 at 11:56 AM message left, updated on 06/21/2020, 06/22/20, 06/23/20, 06/24/2020 message left on 12:23 PM  Code Status : Full  Consults  :   Neuro, Renal  PUD Prophylaxis : None   Procedures  :     MRI Brain - Non acute  EEG - Post ictial state  CTA - 1. Negative for pulmonary embolism. 2. Findings of volume overload with bilateral pleural effusions, ascites, and anasarca. 3. A small portion of the colon is visualized in the region of the splenic flexure with associated colonic wall thickening. Findings suggestive of colitis in the setting of known Clostridium difficile infection. 4. Redemonstration of multiple thyroid lobe nodules. Isthmus nodule has slightly increased in size from prior CT. Recommend thyroid US (ref: J Am Coll Radiol. 2015 Feb;12(2): 143-50). 5. Aortic and coronary artery atherosclerosis (ICD10-I70.0).   TTE - 1. LV function is severerly depressed The very proximal portion of LV is vigorous. There is akinesis of the distal lateral, mid/distal septal, mid/dital inferior, distal anterior and apical walls are akinetic. Compared to echo report from Dec, 2020 wall  motion abnormalities and depressed LVEF are new.. Left ventricular ejection fraction, by estimation, is 25 to 30%. The left ventricle has severely decreased function. Left ventricular diastolic parameters are indeterminate.  2. Akinesis of the apex.. Right ventricular systolic function is mildly reduced. The right ventricular size is normal. There is normal pulmonary artery systolic pressure.  3. The mitral valve is abnormal. Mild mitral valve regurgitation.  4. The aortic valve is abnormal. Aortic valve regurgitation is not visualized. Mild aortic valve sclerosis is present, with no evidence of aortic valve stenosis.  Leg [place] - No DVT.      Disposition Plan  :    Status is: Inpatient  Remains inpatient appropriate because:IV treatments appropriate due to intensity of illness or inability to take PO   Dispo: The patient is from: Home              Anticipated d/c is to: Home              Anticipated d/c date is: 3 days  Patient currently is  not medically stable to d/c.   Difficult to place patient No   DVT Prophylaxis  :   Heparin   Lab Results  Component Value Date   PLT 278 06/24/2020    Diet :  Diet Order            Diet renal with fluid restriction Fluid restriction: 1200 mL Fluid; Room service appropriate? Yes; Fluid consistency: Thin  Diet effective now                  Inpatient Medications  Scheduled Meds: . sodium chloride   Intravenous Once  . aspirin EC  81 mg Oral Daily  . calcitRIOL  0.5 mcg Oral Daily  . Chlorhexidine Gluconate Cloth  6 each Topical Daily  . cinacalcet  30 mg Oral Q breakfast  . darbepoetin (ARANESP) injection - NON-DIALYSIS  60 mcg Subcutaneous Q Sat-1800  . feeding supplement (NEPRO CARB STEADY)  237 mL Oral BID BM  . fidaxomicin  200 mg Oral BID  . gentamicin cream  1 application Topical Daily  . heparin  5,000 Units Subcutaneous Q8H  . metoprolol tartrate  25 mg Oral BID  . multivitamin  1 tablet Oral QHS  . sodium bicarbonate  650 mg Oral TID  . sodium chloride flush  3 mL Intravenous Once  . sodium chloride flush  3 mL Intravenous Q12H   Continuous Infusions: . levETIRAcetam Stopped (06/24/20 0531)   PRN Meds:.acetaminophen **OR** [DISCONTINUED] acetaminophen, butalbital-acetaminophen-caffeine, Gerhardt's butt cream, dianeal solution for CAPD/[name] with heparin, traZODone  Antibiotics  :    Anti-infectives (From admission, onward)   Start     Dose/Rate Route Frequency Ordered Stop   06/19/20 2200  fidaxomicin (DIFICID) tablet 200 mg        200 mg Oral 2 times daily 06/19/20 2005 06/29/20 2159       Time Spent in minutes  30   Lala Lund M.D on 06/24/2020 at 12:19 PM  To page go to [email or web address]   [facility] -  Office  [phone]    See all Orders from today for further details    Objective:   Vitals:   06/24/20 0300 06/24/20 0500 06/24/20 0800 06/24/20 0920  BP: 95/70  109/73 95/66  Pulse: 89   (!) 103  Resp: 17  14 (!) 24  Temp:  97.8 F (36.6 C)  (P) 98.5 F (36.9 C) 98.6 F (37 C)  TempSrc: Oral  (P) Oral Oral  SpO2: 96%  (P) 97% 90%  Weight:  55.9 kg    Height:        Wt Readings from Last 3 Encounters:  06/24/20 55.9 kg  05/31/20 50.8 kg  04/26/20 53.5 kg     Intake/Output Summary (Last 24 hours) at 06/24/2020 1219 Last data filed at 06/24/2020 0600 Gross per 24 hour  Intake 562 ml  Output 150 ml  Net 412 ml     Physical Exam  Awake Alert, No new F.N deficits, Normal affect [place].AT,PERRAL Supple Neck,No JVD, No cervical lymphadenopathy appriciated.  Symmetrical Chest wall movement, Good air movement bilaterally, CTAB RRR,No Gallops, Rubs or new Murmurs, No Parasternal Heave +ve B.Sounds, Abd Soft, No tenderness, No organomegaly appriciated, No rebound - guarding or rigidity. No Cyanosis, trace edema, stable PD site     Data Review:    CBC Recent Labs  Lab 06/19/20 1613 06/19/20 1617 06/21/20 0623 06/22/20 0139 06/22/20 0704 06/22/20 1131 06/23/20 0057 06/24/20 0102  WBC 16.7*   < >  11.5* 10.9* 11.4* 14.1* 9.3 9.1  HGB 9.0*   < > 7.4* 6.7* 9.3* 10.1* 9.9* 10.2*  HCT 25.6*   < > 21.3* 19.1* 25.5* 28.0* 27.9* 29.8*  PLT 443*   < > 348 345 330 307 263 278  MCV 87.7   < > 88.4 87.2 84.7 84.8 86.6 87.6  MCH 30.8   < > 30.7 30.6 30.9 30.6 30.7 30.0  MCHC 35.2   < > 34.7 35.1 36.5* 36.1* 35.5 34.2  RDW 16.5*   < > 18.2* 18.6* 17.1* 17.2* 17.8* 18.1*  LYMPHSABS 1.1  --  1.1 1.2  --   --  0.8 1.3  MONOABS 1.0  --  0.7 0.7  --   --  0.5 0.8  EOSABS 0.0  --  0.2 0.2  --   --  0.1 0.3  BASOSABS 0.0  --  0.0 0.0  --   --  0.0 0.1   < > = values in this interval not displayed.    Recent Labs  Lab 06/19/20 1613 06/19/20 1617 06/21/20 0500 06/21/20 0623 06/21/20 1151 06/22/20 0139 06/22/20 2216 06/23/20 0057 06/23/20 0652 06/23/20 0930 06/24/20 0102  NA 123*   < > 127* 127* 126* 126*  --  130*  --   --  129*  K 2.1*   < > 4.0 4.0 4.6 4.6  --  4.0  --   --  3.2*  CL 86*   < >  96* 97* 95* 96*  --  100  --   --  97*  CO2 15*   < > 16* 15* 16* 19*  --  18*  --   --  22  GLUCOSE 158*   < > 69* 74 81 90  --  193*  --   --  106*  BUN 36*   < > 33* 33* 34* 38*  --  36*  --   --  28*  CREATININE 5.01*   < > 4.81* 4.88* 4.97* 5.26*  --  5.04*  --   --  4.19*  CALCIUM 7.3*   < > 7.6* 7.7* 8.0* 7.8*  --  8.0*  --   --  7.9*  AST 38  --  36 35  --  29  --  33  --   --  25  ALT 27  --  20 20  --  20  --  23  --   --  20  ALKPHOS 86  --  93 84  --  82  --  90  --   --  90  BILITOT 0.5  --  0.8 0.6  --  0.5  --  0.4  --   --  0.4  ALBUMIN 2.0*  --  1.8* 1.8*  --  1.7*  --  1.9*  --   --  1.7*  MG  --    < > 2.1  --   --  2.0 1.9 1.9  --   --  1.7  CRP  --   --  3.6*  --   --  3.0*  --  2.1*  --   --  1.4*  DDIMER  --   --   --  1.31*  --  1.27* 1.64* 1.98*  --   --  1.95*  LATICACIDVEN  --   --   --   --   --   --   --   --  4.2* 3.0*  --  INR 1.1  --   --   --   --   --   --   --   --   --   --   TSH  --   --   --   --   --   --  4.334  --   --   --   --   BNP  --   --   --  276.9*  --  269.5*  --  1,142.5*  --   --  1,625.0*   < > = values in this interval not displayed.    ------------------------------------------------------------------------------------------------------------------ Recent Labs    06/24/20 0102  CHOL 170  HDL 88  LDLCALC 68  TRIG 68  CHOLHDL 1.9    No results found for: HGBA1C ------------------------------------------------------------------------------------------------------------------ Recent Labs    06/22/20 2216  TSH 4.334    Cardiac Enzymes No results for input(s): CKMB, TROPONINI, MYOGLOBIN in the last 168 hours.  Invalid input(s): CK ------------------------------------------------------------------------------------------------------------------    Component Value Date/Time   BNP 1,625.0 (H) 06/24/2020 0102    Micro Results Recent Results (from the past 240 hour(s))  MRSA PCR Screening     Status: None   Collection  Time: 06/21/20 12:34 AM   Specimen: Nasopharyngeal  Result Value Ref Range Status   MRSA by PCR NEGATIVE NEGATIVE Final    Comment:        The GeneXpert MRSA Assay (FDA approved for NASAL specimens only), is one component of a comprehensive MRSA colonization surveillance program. It is not intended to diagnose MRSA infection nor to guide or monitor treatment for MRSA infections. Performed at [facility], [place] [address], [place], [place] [address]     Radiology Reports CT ANGIO CHEST PE W OR WO CONTRAST  Result Date: 06/23/2020 CLINICAL DATA:  Tachycardia. Elevated D-dimer. Pulmonary embolism suspected. Recent right hip ORIF. EXAM: CT ANGIOGRAPHY CHEST WITH CONTRAST TECHNIQUE: Multidetector CT imaging of the chest was performed using the standard protocol during bolus administration of intravenous contrast. Multiplanar CT image reconstructions and MIPs were obtained to evaluate the vascular anatomy. CONTRAST:  73mL OMNIPAQUE IOHEXOL 350 MG/ML SOLN COMPARISON:  CT 04/06/2017, x-ray 06/22/2020 FINDINGS: Cardiovascular: Satisfactory opacification of the pulmonary arteries to the segmental level. No evidence of pulmonary embolism. Thoracic aorta is nonaneurysmal. Scattered atherosclerotic calcification of the aorta and coronary arteries. Normal heart size. No pericardial effusion. Mediastinum/Nodes: No axillary, mediastinal, or hilar lymphadenopathy. Trachea and esophagus within normal limits. Redemonstration of multiple thyroid lobe nodules measuring up to 1.5 cm in the right thyroid lobe and 1.7 cm in the region of the thyroid isthmus. Isthmus nodule has slightly increased in size from prior CT. Lungs/Pleura: Small left and trace right pleural effusions with associated compressive atelectasis. Lungs are otherwise clear. No pneumothorax. Upper Abdomen: Small volume ascites within the visualized upper abdomen. A small portion of the colon is visualized in the region of the splenic flexure with  associated colonic wall thickening (series 5, image 92). Musculoskeletal: Mild anasarca.  No acute osseous findings. Review of the MIP images confirms the above findings. IMPRESSION: 1. Negative for pulmonary embolism. 2. Findings of volume overload with bilateral pleural effusions, ascites, and anasarca. 3. A small portion of the colon is visualized in the region of the splenic flexure with associated colonic wall thickening. Findings suggestive of colitis in the setting of known Clostridium difficile infection. 4. Redemonstration of multiple thyroid lobe nodules. Isthmus nodule has slightly increased in size from prior CT. Recommend thyroid  US (ref: J Am Coll Radiol. 2015 Feb;12(2): 143-50). 5. Aortic and coronary artery atherosclerosis (ICD10-I70.0). Electronically Signed   By: Davina Poke D.O.   On: 06/23/2020 11:26   MR BRAIN WO CONTRAST  Result Date: 06/20/2020 CLINICAL DATA:  Left-sided neglect and confusion.  Seizure activity. EXAM: MRI HEAD WITHOUT CONTRAST TECHNIQUE: Multiplanar, multiecho pulse sequences of the brain and surrounding structures were obtained without intravenous contrast. COMPARISON:  Head CT 06/19/2020 FINDINGS: Brain: There is no evidence of an acute infarct, intracranial hemorrhage, mass, midline shift, or extra-axial fluid collection. T2 hyperintensities in the cerebral white matter bilaterally are nonspecific but compatible with mild chronic small vessel ischemic disease. Chronic lacunar infarcts are noted in the right caudate nucleus and posterior right corona radiata. A chronic lacunar infarct is also noted in the right pons. There is moderate generalized cerebral atrophy. Vascular: Major intracranial vascular flow voids are preserved. Skull and upper cervical spine: Unremarkable bone marrow signal. Moderate to severe disc degeneration at C4-5 and C5-6 and severe left facet arthrosis at C4-5 with grade 1 anterolisthesis. Sinuses/Orbits: Mild bilateral ethmoid air cell  mucosal thickening. Clear mastoid air cells. Unremarkable orbits. Other: None. IMPRESSION: 1. No acute intracranial abnormality. 2. Chronic small-vessel ischemia with multiple chronic lacunar infarcts. Electronically Signed   By: Logan Bores M.D.   On: 06/20/2020 10:09   DG CHEST PORT 1 VIEW  Result Date: 06/22/2020 CLINICAL DATA:  Shortness of breath EXAM: PORTABLE CHEST 1 VIEW COMPARISON:  Radiograph 05/29/2020 FINDINGS: There are some chronically coarsened interstitial and bronchitic features which are slightly accentuated in the bases possibly related to low volumes and atelectatic change. Blunting of left costophrenic sulcus, possibly scarring versus a trace effusion. Pulmonary vascularity remains fairly well-defined. Cardiomediastinal contours are unremarkable. The osseous structures appear diffusely demineralized which may limit detection of small or nondisplaced fractures. No acute osseous abnormality or suspicious osseous lesion. Remaining soft tissues are unremarkable. Telemetry leads overlie the chest. IMPRESSION: 1. Chronic interstitial and bronchitic features are slightly accentuated in the bases, likely related to low volumes and atelectatic change. 2. Blunting of the left costophrenic sulcus, possibly scarring versus a trace effusion. Electronically Signed   By: Lovena Le M.D.   On: 06/22/2020 22:24   DG Chest Portable 1 View  Result Date: 05/29/2020 CLINICAL DATA:  Weakness EXAM: PORTABLE CHEST 1 VIEW COMPARISON:  12/16/2019 and prior. FINDINGS: No pneumothorax or pleural effusion. Cardiomediastinal silhouette within normal limits. Hazy appearance of the peripheral lung fields. No acute osseous abnormality. IMPRESSION: Hazy appearance of the peripheral lung fields may be artifactual however cannot exclude atypical infection. Electronically Signed   By: Primitivo Gauze M.D.   On: 05/29/2020 16:41   EEG adult  Result Date: 06/19/2020 [name], MD     06/19/2020  7:42 PM  Patient Name: ERYKAH LIPPERT MRN: [id number] Epilepsy Attending: [name] Referring Physician/Provider: Anibal Henderson, NP Date: 06/19/2020 Duration: Patient history: 70yo F with sudden onset speech disturbance, right gaze deviation and seizure like episode. EEG to evaluate for seizure  Level of alertness: Awake AEDs during EEG study: LEV Technical aspects: This EEG study was done with scalp electrodes positioned according to the 10-20 International system of electrode placement. Electrical activity was acquired at a sampling rate of 500Hz  and reviewed with a high frequency filter of 70Hz  and a low frequency filter of 1Hz . EEG data were recorded continuously and digitally stored. Description: No clear posterior dominant rhythm was seen.  EEG showed continuous generalized and maximal bifrontal  5-9hz  theta-delta activity as well as intermittent 2-3hz  delta slowing. Hyperventilation and photic stimulation were not performed.   ABNORMALITY -Continuous slow, generalized and maximal bifrontal region IMPRESSION: This study is suggestive of cortical dysfunction in bifrontal region which is non specific but could be seen due to underlying structural abnormality, post-ictal state. There is also mild to moderate diffuse encephalopathy, nonspecific etiology. No seizures or epileptiform discharges were seen throughout the recording. [name]   ECHOCARDIOGRAM COMPLETE  Result Date: 06/23/2020    ECHOCARDIOGRAM REPORT   Patient Name:   SHANINE KREIGER [facility] Date of Exam: 06/23/2020 Medical Rec #:  [id number]     Height:       64.0 in Accession #:    [id number]    Weight:       132.7 lb Date of Birth:  1950-07-01     BSA:          1.643 m Patient Age:    85 years      BP:           102/70 mmHg Patient Gender: F             HR:           106 bpm. Exam Location:  Inpatient Procedure: 2D Echo Indications:    elevated troponin  History:        Patient has prior history of Echocardiogram examinations, most                 recent  04/04/2019. End stage renal disease; Risk                 Factors:Hypertension and Dyslipidemia.  Sonographer:    [name] Referring Phys: [place]  1. LV function is severerly depressed The very proximal portion of LV is vigorous. There is akinesis of the distal lateral, mid/distal septal, mid/dital inferior, distal anterior and apical walls are akinetic. Compared to echo report from Dec, 2020 wall  motion abnormalities and depressed LVEF are new.. Left ventricular ejection fraction, by estimation, is 25 to 30%. The left ventricle has severely decreased function. Left ventricular diastolic parameters are indeterminate.  2. Akinesis of the apex.. Right ventricular systolic function is mildly reduced. The right ventricular size is normal. There is normal pulmonary artery systolic pressure.  3. The mitral valve is abnormal. Mild mitral valve regurgitation.  4. The aortic valve is abnormal. Aortic valve regurgitation is not visualized. Mild aortic valve sclerosis is present, with no evidence of aortic valve stenosis. FINDINGS  Left Ventricle: LV function is severerly depressed The very proximal portion of LV is vigorous. There is akinesis of the distal lateral, mid/distal septal, mid/dital inferior, distal anterior and apical walls are akinetic. Compared to echo report from Dec, 2020 wall motion abnormalities and depressed LVEF are new. Left ventricular ejection fraction, by estimation, is 25 to 30%. The left ventricle has severely decreased function. The left ventricular internal cavity size was normal in size. There is no  left ventricular hypertrophy. Left ventricular diastolic parameters are indeterminate. Right Ventricle: Akinesis of the apex. The right ventricular size is normal. Right vetricular wall thickness was not assessed. Right ventricular systolic function is mildly reduced. There is normal pulmonary artery systolic pressure. The tricuspid regurgitant velocity is 2.71  m/s, and with an assumed right atrial pressure of 3 mmHg, the estimated right ventricular systolic pressure is 21.2 mmHg. Left Atrium: Left atrial size was normal in size. Right Atrium: Right atrial size was normal  in size. Pericardium: There is no evidence of pericardial effusion. Mitral Valve: The mitral valve is abnormal. There is mild thickening of the mitral valve leaflet(s). Mild mitral annular calcification. Mild mitral valve regurgitation. Tricuspid Valve: The tricuspid valve is normal in structure. Tricuspid valve regurgitation is trivial. Aortic Valve: The aortic valve is abnormal. Aortic valve regurgitation is not visualized. Mild aortic valve sclerosis is present, with no evidence of aortic valve stenosis. Pulmonic Valve: The pulmonic valve was normal in structure. Pulmonic valve regurgitation is not visualized. Aorta: The aortic root is normal in size and structure. IAS/Shunts: The interatrial septum was not assessed.  LEFT VENTRICLE PLAX 2D LVIDd:         5.10 cm     Diastology LVIDs:         4.00 cm     LV e' lateral: 4.24 cm/s LV PW:         1.00 cm LV IVS:        0.80 cm LVOT diam:     1.80 cm LV SV:         43 LV SV Index:   26 LVOT Area:     2.54 cm  LV Volumes (MOD) LV vol d, MOD A2C: 64.3 ml LV vol d, MOD A4C: 90.6 ml LV vol s, MOD A2C: 48.8 ml LV vol s, MOD A4C: 69.1 ml LV SV MOD A2C:     15.5 ml LV SV MOD A4C:     90.6 ml LV SV MOD BP:      20.1 ml RIGHT VENTRICLE             IVC RV S prime:     13.30 cm/s  IVC diam: 1.20 cm TAPSE (M-mode): 1.4 cm LEFT ATRIUM             Index       RIGHT ATRIUM          Index LA diam:        3.30 cm 2.01 cm/m  RA Area:     8.93 cm LA Vol (A2C):   79.4 ml 48.31 ml/m RA Volume:   15.60 ml 9.49 ml/m LA Vol (A4C):   42.0 ml 25.56 ml/m LA Biplane Vol: 64.2 ml 39.06 ml/m  AORTIC VALVE LVOT Vmax:   91.40 cm/s LVOT Vmean:  63.500 cm/s LVOT VTI:    0.170 m  AORTA Ao Root diam: 3.20 cm Ao Asc diam:  3.20 cm TRICUSPID VALVE TR Peak grad:   29.4 mmHg TR Vmax:         271.00 cm/s  SHUNTS Systemic VTI:  0.17 m Systemic Diam: 1.80 cm Dorris Carnes MD Electronically signed by Dorris Carnes MD Signature Date/Time: 06/23/2020/7:19:28 PM    Final    CT HEAD CODE STROKE WO CONTRAST  Result Date: 06/19/2020 CLINICAL DATA:  Code stroke.  Acute stroke presentation. EXAM: CT HEAD WITHOUT CONTRAST TECHNIQUE: Contiguous axial images were obtained from the base of the skull through the vertex without intravenous contrast. COMPARISON:  12/16/2019 FINDINGS: Brain: Generalized atrophy. No focal infarction seen affecting the brainstem or cerebellum. Cerebral hemispheres show chronic small-vessel ischemic changes of the hemispheric white matter and the basal ganglia regions. No sign of acute infarction, mass lesion, hemorrhage hydrocephalus or extra-axial collection. Vascular: There is atherosclerotic calcification of the major vessels at the base of the brain. Skull: Negative Sinuses/Orbits: Clear/normal Other: None ASPECTS ([place] Stroke Program Early CT Score) - Ganglionic level infarction (caudate, lentiform nuclei, internal capsule, insula, M1-M3 cortex):  7 - Supraganglionic infarction (M4-M6 cortex): 3 Total score (0-10 with 10 being normal): 10 IMPRESSION: 1. No acute finding by CT. Atrophy and chronic small-vessel ischemic changes of the cerebral hemispheric white matter and basal ganglia regions. 2. ASPECTS is 10. 3. These results were communicated to Dr. Charlsie Merles At 4:25 pmon 2/17/2022by text page via the [facility] messaging system. Electronically Signed   By: Nelson Chimes M.D.   On: 06/19/2020 16:25   VAS [place] LOWER EXTREMITY VENOUS (DVT)  Result Date: 06/24/2020  Lower Venous DVT Study Other Indications: D-DImer. Risk Factors: None identified. Comparison Study: 5/18 Negative Performing Technologist: Vonzell Schlatter RVT  Examination Guidelines: A complete evaluation includes B-mode imaging, spectral Doppler, color Doppler, and power Doppler as needed of all accessible portions of each vessel.  Bilateral testing is considered an integral part of a complete examination. Limited examinations for reoccurring indications may be performed as noted. The reflux portion of the exam is performed with the patient in reverse Trendelenburg.  +---------+---------------+---------+-----------+----------+--------------+ RIGHT    CompressibilityPhasicitySpontaneityPropertiesThrombus Aging +---------+---------------+---------+-----------+----------+--------------+ CFV      Full           Yes      Yes                                 +---------+---------------+---------+-----------+----------+--------------+ SFJ      Full                                                        +---------+---------------+---------+-----------+----------+--------------+ FV Prox  Full                                                        +---------+---------------+---------+-----------+----------+--------------+ FV Mid   Full                                                        +---------+---------------+---------+-----------+----------+--------------+ FV DistalFull                                                        +---------+---------------+---------+-----------+----------+--------------+ PFV      Full                                                        +---------+---------------+---------+-----------+----------+--------------+ POP      Full           Yes      Yes                                 +---------+---------------+---------+-----------+----------+--------------+ PTV  Full                                                        +---------+---------------+---------+-----------+----------+--------------+ PERO     Full                                                        +---------+---------------+---------+-----------+----------+--------------+   +---------+---------------+---------+-----------+----------+--------------+ LEFT      CompressibilityPhasicitySpontaneityPropertiesThrombus Aging +---------+---------------+---------+-----------+----------+--------------+ CFV      Full           Yes      Yes                                 +---------+---------------+---------+-----------+----------+--------------+ SFJ      Full                                                        +---------+---------------+---------+-----------+----------+--------------+ FV Prox  Full                                                        +---------+---------------+---------+-----------+----------+--------------+ FV Mid   Full                                                        +---------+---------------+---------+-----------+----------+--------------+ FV DistalFull                                                        +---------+---------------+---------+-----------+----------+--------------+ PFV      Full                                                        +---------+---------------+---------+-----------+----------+--------------+ POP      Full           Yes      Yes                                 +---------+---------------+---------+-----------+----------+--------------+ PTV      Full                                                        +---------+---------------+---------+-----------+----------+--------------+  PERO     Full                                                        +---------+---------------+---------+-----------+----------+--------------+  Summary: RIGHT: - There is no evidence of deep vein thrombosis in the lower extremity.  - A cystic structure is found in the popliteal fossa. - wall thickening was noted in grey scale images indicating possible post-phlebitic changes  LEFT: - wall thickening was noted in greyscale indicating possible post-phlebitic changes  *See table(s) above for measurements and observations.    Preliminary    CT ANGIO HEAD CODE STROKE  Result  Date: 06/19/2020 CLINICAL DATA:  Left-sided neglect. Seizure activity. Acute stroke suspected. EXAM: CT ANGIOGRAPHY HEAD AND NECK TECHNIQUE: Multidetector CT imaging of the head and neck was performed using the standard protocol during bolus administration of intravenous contrast. Multiplanar CT image reconstructions and MIPs were obtained to evaluate the vascular anatomy. Carotid stenosis measurements (when applicable) are obtained utilizing NASCET criteria, using the distal internal carotid diameter as the denominator. CONTRAST:  16mL OMNIPAQUE IOHEXOL 350 MG/ML SOLN COMPARISON:  Head CT earlier same day FINDINGS: CTA NECK FINDINGS Aortic arch: Mild atherosclerotic change of the aortic arch. Branching pattern is normal without origin stenosis. Right carotid system: Common carotid artery widely patent to the bifurcation. Carotid bifurcation shows minimal plaque but there is no stenosis. Cervical ICA widely patent. Left carotid system: Common carotid artery widely patent to the bifurcation. Mild plaque at the bifurcation and ICA bulb but no stenosis. Cervical ICA widely patent. Vertebral arteries: Large vertebral arteries widely patent at their origins and through the cervical region to the foramen magnum. Skeleton: Mid cervical spondylosis which could be painful. Other neck: No adenopathy. Enlarged heterogeneous thyroid gland. Largest nodule in the isthmus measures up to 2 cm in diameter. Upper chest: Mild pleural and parenchymal scarring at the lung apices. Review of the MIP images confirms the above findings CTA HEAD FINDINGS Anterior circulation: Both internal carotid arteries are patent through the skull base and siphon regions. There is ordinary atherosclerotic calcification in the carotid siphon regions but without stenosis greater than 30% suspected. The anterior and middle cerebral vessels are patent. No large or medium vessel occlusion identified. Distal branch vessels do show atherosclerotic irregularity.  Posterior circulation: Both vertebral arteries widely patent through the foramen magnum to the basilar. There is focal plaque of the left vertebral artery V4 segment with stenosis estimated at 30-50%. No vertebral stenosis. Posterior circulation branch vessels are patent. Distal branch vessels do show atherosclerotic irregularity. Venous sinuses: Patent and normal. Anatomic variants: None significant. Review of the MIP images confirms the above findings IMPRESSION: 1. No large or medium vessel occlusion. Widespread intracranial distal vessel atherosclerotic irregularity. 2. Minimal atherosclerotic change at both carotid bifurcations but without stenosis. 3. 30-50% stenosis of the left vertebral artery V4 segment. 4. Enlarged heterogeneous thyroid gland. Largest nodule in the isthmus measures up to 2 cm in diameter. Recommend thyroid US (ref: J Am Coll Radiol. 2015 Feb;12(2): 143-50). Aortic Atherosclerosis (ICD10-I70.0). Electronically Signed   By: Nelson Chimes M.D.   On: 06/19/2020 17:08   CT ANGIO NECK CODE STROKE  Result Date: 06/19/2020 CLINICAL DATA:  Left-sided neglect. Seizure activity. Acute stroke suspected. EXAM: CT ANGIOGRAPHY HEAD AND NECK TECHNIQUE: Multidetector CT imaging of the head and neck  was performed using the standard protocol during bolus administration of intravenous contrast. Multiplanar CT image reconstructions and MIPs were obtained to evaluate the vascular anatomy. Carotid stenosis measurements (when applicable) are obtained utilizing NASCET criteria, using the distal internal carotid diameter as the denominator. CONTRAST:  18mL OMNIPAQUE IOHEXOL 350 MG/ML SOLN COMPARISON:  Head CT earlier same day FINDINGS: CTA NECK FINDINGS Aortic arch: Mild atherosclerotic change of the aortic arch. Branching pattern is normal without origin stenosis. Right carotid system: Common carotid artery widely patent to the bifurcation. Carotid bifurcation shows minimal plaque but there is no stenosis.  Cervical ICA widely patent. Left carotid system: Common carotid artery widely patent to the bifurcation. Mild plaque at the bifurcation and ICA bulb but no stenosis. Cervical ICA widely patent. Vertebral arteries: Large vertebral arteries widely patent at their origins and through the cervical region to the foramen magnum. Skeleton: Mid cervical spondylosis which could be painful. Other neck: No adenopathy. Enlarged heterogeneous thyroid gland. Largest nodule in the isthmus measures up to 2 cm in diameter. Upper chest: Mild pleural and parenchymal scarring at the lung apices. Review of the MIP images confirms the above findings CTA HEAD FINDINGS Anterior circulation: Both internal carotid arteries are patent through the skull base and siphon regions. There is ordinary atherosclerotic calcification in the carotid siphon regions but without stenosis greater than 30% suspected. The anterior and middle cerebral vessels are patent. No large or medium vessel occlusion identified. Distal branch vessels do show atherosclerotic irregularity. Posterior circulation: Both vertebral arteries widely patent through the foramen magnum to the basilar. There is focal plaque of the left vertebral artery V4 segment with stenosis estimated at 30-50%. No vertebral stenosis. Posterior circulation branch vessels are patent. Distal branch vessels do show atherosclerotic irregularity. Venous sinuses: Patent and normal. Anatomic variants: None significant. Review of the MIP images confirms the above findings IMPRESSION: 1. No large or medium vessel occlusion. Widespread intracranial distal vessel atherosclerotic irregularity. 2. Minimal atherosclerotic change at both carotid bifurcations but without stenosis. 3. 30-50% stenosis of the left vertebral artery V4 segment. 4. Enlarged heterogeneous thyroid gland. Largest nodule in the isthmus measures up to 2 cm in diameter. Recommend thyroid US (ref: J Am Coll Radiol. 2015 Feb;12(2): 143-50).  Aortic Atherosclerosis (ICD10-I70.0). Electronically Signed   By: Nelson Chimes M.D.   On: 06/19/2020 17:08

## 2020-06-24 NOTE — TOC Benefit Eligibility Note (Signed)
Patient Advocate Encounter  Patient is approved through the [employer] Patient Assistance Program for Dific[employer] through 05/02/2021.   Assistance Phone Number is [phone]  Dificid will be mailed to patient's home.   Lyndel Safe, [place] Patient Advocate Specialist [facility]wardship Team Direct Number: [phone]  Fax: [phone]

## 2020-06-25 ENCOUNTER — Encounter (HOSPITAL_COMMUNITY)
Admission: EM | Disposition: A | Discharge status: Rehabilitation Facility | Payer: Self-pay | Source: Home / Self Care | Attending: Internal Medicine

## 2020-06-25 ENCOUNTER — Encounter (HOSPITAL_COMMUNITY): Payer: Self-pay | Admitting: Cardiovascular Disease

## 2020-06-25 DIAGNOSIS — N186 End stage renal disease: Secondary | ICD-10-CM | POA: Diagnosis not present

## 2020-06-25 DIAGNOSIS — E785 Hyperlipidemia, unspecified: Secondary | ICD-10-CM | POA: Diagnosis not present

## 2020-06-25 DIAGNOSIS — A0472 Enterocolitis due to Clostridium difficile, not specified as recurrent: Secondary | ICD-10-CM | POA: Diagnosis not present

## 2020-06-25 DIAGNOSIS — I255 Ischemic cardiomyopathy: Secondary | ICD-10-CM

## 2020-06-25 DIAGNOSIS — I214 Non-ST elevation (NSTEMI) myocardial infarction: Secondary | ICD-10-CM | POA: Diagnosis not present

## 2020-06-25 DIAGNOSIS — I10 Essential (primary) hypertension: Secondary | ICD-10-CM | POA: Diagnosis not present

## 2020-06-25 DIAGNOSIS — I2510 Atherosclerotic heart disease of native coronary artery without angina pectoris: Secondary | ICD-10-CM

## 2020-06-25 HISTORY — PX: LEFT HEART CATH AND CORONARY ANGIOGRAPHY: CATH118249

## 2020-06-25 LAB — COMPREHENSIVE METABOLIC PANEL
ALT: 20 U/L (ref 0–44)
AST: 22 U/L (ref 15–41)
Albumin: 1.4 g/dL — ABNORMAL LOW (ref 3.5–5.0)
Alkaline Phosphatase: 80 U/L (ref 38–126)
Anion gap: 10 (ref 5–15)
BUN: 27 mg/dL — ABNORMAL HIGH (ref 8–23)
CO2: 23 mmol/L (ref 22–32)
Calcium: 7.9 mg/dL — ABNORMAL LOW (ref 8.9–10.3)
Chloride: 98 mmol/L (ref 98–111)
Creatinine, Ser: 4.02 mg/dL — ABNORMAL HIGH (ref 0.44–1.00)
GFR, Estimated: 11 mL/min — ABNORMAL LOW (ref >60)
Glucose, Bld: 110 mg/dL — ABNORMAL HIGH (ref 70–99)
Potassium: 3.5 mmol/L (ref 3.5–5.1)
Sodium: 131 mmol/L — ABNORMAL LOW (ref 135–145)
Total Bilirubin: 0.5 mg/dL (ref 0.3–1.2)
Total Protein: 3.4 g/dL — ABNORMAL LOW (ref 6.5–8.1)

## 2020-06-25 LAB — CBC WITH DIFFERENTIAL/PLATELET
Abs Immature Granulocytes: 0.07 10*3/uL (ref 0.00–0.07)
Basophils Absolute: 0 10*3/uL (ref 0.0–0.1)
Basophils Relative: 1 %
Eosinophils Absolute: 0.2 10*3/uL (ref 0.0–0.5)
Eosinophils Relative: 2 %
HCT: 24 % — ABNORMAL LOW (ref 36.0–46.0)
Hemoglobin: 8.7 g/dL — ABNORMAL LOW (ref 12.0–15.0)
Immature Granulocytes: 1 %
Lymphocytes Relative: 15 %
Lymphs Abs: 1.2 10*3/uL (ref 0.7–4.0)
MCH: 31.3 pg (ref 26.0–34.0)
MCHC: 36.3 g/dL — ABNORMAL HIGH (ref 30.0–36.0)
MCV: 86.3 fL (ref 80.0–100.0)
Monocytes Absolute: 0.8 10*3/uL (ref 0.1–1.0)
Monocytes Relative: 10 %
Neutro Abs: 5.6 10*3/uL (ref 1.7–7.7)
Neutrophils Relative %: 71 %
Platelets: 216 10*3/uL (ref 150–400)
RBC: 2.78 MIL/uL — ABNORMAL LOW (ref 3.87–5.11)
RDW: 18.2 % — ABNORMAL HIGH (ref 11.5–15.5)
WBC: 7.8 10*3/uL (ref 4.0–10.5)
nRBC: 0 % (ref 0.0–0.2)

## 2020-06-25 LAB — CBC
HCT: 26.5 % — ABNORMAL LOW (ref 36.0–46.0)
Hemoglobin: 9.7 g/dL — ABNORMAL LOW (ref 12.0–15.0)
MCH: 31.4 pg (ref 26.0–34.0)
MCHC: 36.6 g/dL — ABNORMAL HIGH (ref 30.0–36.0)
MCV: 85.8 fL (ref 80.0–100.0)
Platelets: 271 10*3/uL (ref 150–400)
RBC: 3.09 MIL/uL — ABNORMAL LOW (ref 3.87–5.11)
RDW: 18.2 % — ABNORMAL HIGH (ref 11.5–15.5)
WBC: 10 10*3/uL (ref 4.0–10.5)
nRBC: 0.2 % (ref 0.0–0.2)

## 2020-06-25 LAB — D-DIMER, QUANTITATIVE: D-Dimer, Quant: 1.87 ug/mL-FEU — ABNORMAL HIGH (ref 0.00–0.50)

## 2020-06-25 LAB — BRAIN NATRIURETIC PEPTIDE: B Natriuretic Peptide: 1416.4 pg/mL — ABNORMAL HIGH (ref 0.0–100.0)

## 2020-06-25 LAB — MAGNESIUM: Magnesium: 1.5 mg/dL — ABNORMAL LOW (ref 1.7–2.4)

## 2020-06-25 LAB — C-REACTIVE PROTEIN: CRP: 0.9 mg/dL (ref <1.0)

## 2020-06-25 LAB — CREATININE, SERUM
Creatinine, Ser: 4.05 mg/dL — ABNORMAL HIGH (ref 0.44–1.00)
GFR, Estimated: 11 mL/min — ABNORMAL LOW (ref >60)

## 2020-06-25 SURGERY — LEFT HEART CATH AND CORONARY ANGIOGRAPHY
Anesthesia: LOCAL

## 2020-06-25 MED ORDER — SODIUM CHLORIDE 0.9 % IV SOLN: INTRAVENOUS | Status: DC

## 2020-06-25 MED ORDER — LABETALOL HCL 5 MG/ML IV SOLN: 10.0000 mg | INTRAVENOUS | Status: AC | PRN

## 2020-06-25 MED ORDER — HYDRALAZINE HCL 20 MG/ML IJ SOLN: 10.0000 mg | INTRAMUSCULAR | Status: AC | PRN

## 2020-06-25 MED ORDER — LIDOCAINE HCL (PF) 1 % IJ SOLN
INTRAMUSCULAR | Status: DC | PRN
  Administered 2020-06-25: 15 mL

## 2020-06-25 MED ORDER — HEPARIN (PORCINE) IN NACL 1000-0.9 UT/500ML-% IV SOLN
INTRAVENOUS | Status: DC | PRN
  Administered 2020-06-25 (×2): 500 mL

## 2020-06-25 MED ORDER — ASPIRIN 81 MG PO CHEW
81.0000 mg | CHEWABLE_TABLET | Freq: Every day | ORAL | Status: DC
  Administered 2020-06-25 – 2020-07-04 (×9): 81 mg via ORAL
  Filled 2020-06-25 (×10): qty 1

## 2020-06-25 MED ORDER — SODIUM CHLORIDE 0.9% FLUSH: 3.0000 mL | INTRAVENOUS | Status: DC | PRN

## 2020-06-25 MED ORDER — DIAZEPAM 5 MG PO TABS
5.0000 mg | ORAL_TABLET | Freq: Four times a day (QID) | ORAL | Status: DC | PRN
  Administered 2020-06-27 – 2020-07-01 (×4): 5 mg via ORAL
  Filled 2020-06-25 (×4): qty 1

## 2020-06-25 MED ORDER — BUPROPION HCL 75 MG PO TABS
75.0000 mg | ORAL_TABLET | Freq: Two times a day (BID) | ORAL | Status: DC
  Administered 2020-06-25 – 2020-06-27 (×5): 75 mg via ORAL
  Filled 2020-06-25 (×6): qty 1

## 2020-06-25 MED ORDER — PANTOPRAZOLE SODIUM 40 MG PO TBEC
40.0000 mg | DELAYED_RELEASE_TABLET | Freq: Every day | ORAL | Status: DC
  Administered 2020-06-25 – 2020-07-04 (×10): 40 mg via ORAL
  Filled 2020-06-25 (×10): qty 1

## 2020-06-25 MED ORDER — SODIUM CHLORIDE 0.9% FLUSH
3.0000 mL | Freq: Two times a day (BID) | INTRAVENOUS | Status: DC
  Administered 2020-06-25 – 2020-06-29 (×4): 3 mL via INTRAVENOUS

## 2020-06-25 MED ORDER — ENOXAPARIN SODIUM 30 MG/0.3ML ~~LOC~~ SOLN: 30.0000 mg | SUBCUTANEOUS | Status: DC

## 2020-06-25 MED ORDER — ACETAMINOPHEN 325 MG PO TABS: 650.0000 mg | ORAL_TABLET | ORAL | Status: DC | PRN

## 2020-06-25 MED ORDER — MIDAZOLAM HCL 2 MG/2ML IJ SOLN
INTRAMUSCULAR | Status: DC | PRN
  Administered 2020-06-25 (×2): 1 mg via INTRAVENOUS

## 2020-06-25 MED ORDER — POTASSIUM CHLORIDE CRYS ER 20 MEQ PO TBCR
20.0000 meq | EXTENDED_RELEASE_TABLET | Freq: Once | ORAL | Status: AC
  Administered 2020-06-25: 20 meq via ORAL
  Filled 2020-06-25: qty 1

## 2020-06-25 MED ORDER — MAGNESIUM SULFATE 2 GM/50ML IV SOLN
2.0000 g | Freq: Once | INTRAVENOUS | Status: AC
  Administered 2020-06-25: 2 g via INTRAVENOUS
  Filled 2020-06-25: qty 50

## 2020-06-25 MED ORDER — MIDAZOLAM HCL 2 MG/2ML IJ SOLN
INTRAMUSCULAR | Status: AC
  Filled 2020-06-25: qty 2

## 2020-06-25 MED ORDER — FENTANYL CITRATE (PF) 100 MCG/2ML IJ SOLN
INTRAMUSCULAR | Status: AC
  Filled 2020-06-25: qty 2

## 2020-06-25 MED ORDER — SODIUM CHLORIDE 0.9 % IV SOLN
250.0000 mL | INTRAVENOUS | Status: DC | PRN
  Administered 2020-06-27: 250 mL via INTRAVENOUS

## 2020-06-25 MED ORDER — LORAZEPAM 1 MG PO TABS: 1.0000 mg | ORAL_TABLET | Freq: Two times a day (BID) | ORAL | Status: DC | PRN

## 2020-06-25 MED ORDER — LIDOCAINE HCL (PF) 1 % IJ SOLN
INTRAMUSCULAR | Status: AC
  Filled 2020-06-25: qty 30

## 2020-06-25 MED ORDER — IOHEXOL 350 MG/ML SOLN
INTRAVENOUS | Status: DC | PRN
  Administered 2020-06-25: 57 mL via INTRA_ARTERIAL

## 2020-06-25 MED ORDER — ONDANSETRON HCL 4 MG/2ML IJ SOLN
4.0000 mg | Freq: Four times a day (QID) | INTRAMUSCULAR | Status: DC | PRN
  Administered 2020-06-25 (×2): 4 mg via INTRAVENOUS
  Filled 2020-06-25 (×3): qty 2

## 2020-06-25 MED ORDER — HEPARIN (PORCINE) IN NACL 1000-0.9 UT/500ML-% IV SOLN
INTRAVENOUS | Status: AC
  Filled 2020-06-25: qty 1000

## 2020-06-25 MED ORDER — ATORVASTATIN CALCIUM 80 MG PO TABS
80.0000 mg | ORAL_TABLET | Freq: Every day | ORAL | Status: DC
  Administered 2020-06-25 – 2020-07-04 (×10): 80 mg via ORAL
  Filled 2020-06-25 (×10): qty 1

## 2020-06-25 MED ORDER — FENTANYL CITRATE (PF) 100 MCG/2ML IJ SOLN
INTRAMUSCULAR | Status: DC | PRN
  Administered 2020-06-25: 25 ug via INTRAVENOUS

## 2020-06-25 SURGICAL SUPPLY — 8 items
CATH INFINITI 5FR MULTPACK ANG (CATHETERS) ×2 IMPLANT
CLOSURE MYNX CONTROL 5F (Vascular Products) ×2 IMPLANT
KIT HEART LEFT (KITS) ×2 IMPLANT
PACK CARDIAC CATHETERIZATION (CUSTOM PROCEDURE TRAY) ×2 IMPLANT
SHEATH PINNACLE 5F 10CM (SHEATH) ×2 IMPLANT
TRANSDUCER W/STOPCOCK (MISCELLANEOUS) ×2 IMPLANT
TUBING CIL FLEX 10 FLL-RA (TUBING) ×2 IMPLANT
WIRE EMERALD 3MM-J .035X150CM (WIRE) ×2 IMPLANT

## 2020-06-25 NOTE — Progress Notes (Signed)
Pt puncture site started bleeding around 1400. RN held pressure for 19min, no hematoma noted. Bleeding stopped. Low BP 88-90s, elevated HR 120s. Dr. Claiborne Billings notified, come to bedside to assess pt. NS @50ml , verbal order given.  Will continue to monitor pt.

## 2020-06-25 NOTE — Progress Notes (Signed)
Progress Note    Amy [name]  ZGY:[id number] DOB: May 22, 1950  DOA: 06/19/2020 PCP: [name], MD    Brief Narrative:     Medical records reviewed and are as summarized below:  Amy [name] is an 70 y.o. female with medical history significant ofESRD on PD, HTN, multiple thyroid nodules. She had a hip fracture in December s/p ORIF. Recently hospitalized 1/25/-05/29/20 for watery diarrhea, hyponatremia, hypokalemia. She was found to be covid 19 positive but was not treated with no signs of respiratory involvement. She did test positive for C. Diff by PCR and was started on oral Vancomycin with a 9 day Rx at discharge which she was unable to get filled. She also developed sinus tachycardia with NSTEMI for which she was seen by cardiology. She is s/p heart cath and now plans to be evaluated by CVTS for CABG.  Assessment/Plan:   Active Problems:   Anxiety   Fever blister   Essential hypertension   Hypokalemia   Hyponatremia   HLD (hyperlipidemia)   ESRD on dialysis (HCC)   C. difficile diarrhea   C. difficile colitis   Seizure (HCC)   Pressure injury of skin   Protein-calorie malnutrition, severe   Non-ST elevation (NSTEMI) myocardial infarction ([place])   Ischemic cardiomyopathy   Seizure with postictal state : due to severe electrolyte abnormalities including hyponatremia, hypomagnesemia, hypokalemia caused by C. difficile diarrhea  - MRI negative -EEG shows postictal state, seen by neurology  - Potassium and magnesium now stable after aggressive replacement, has been hydrated with IV fluids - on Keppra  -Neuro recommends DC on Keppra with Neuro follow up   C. difficile infection present on admission.  Being treated with Dificid as she seems to have failed 10-day course of vancomycin.  Note C. difficile treatment with Dificid will be continued post discharge, Dificid for home has been arranged by [facility] inpatient pharmacy, with the assistant of Ysidro Evert ID pharmacist per  Dr. Candiss Norse   Recent COVID-19 incidental infection.  Supportive care.   ESRD (57yrs) .  On PD.  Renal following.  To be resumed, oral bicarb continued for metabolic acidosis -nephrology following  Anemia of chronic disease.  Anemia panel suggestive of chronic disease, no signs of active bleeding, 1 unit of packed RBC transfusion on 06/22/2020   sinus tachycardia/ NSTEMI.  - CTA chest and leg ultrasound unremarkable - echocardiogram shows a depressed EF of 25% with typical wall motion abnormalities -s/p cath: Severe multivessel CAD with diffuse 80%, 95% and 80% proximal LAD stenosis, 90% diagonal stenosis;  85% focal ramus intermediate stenosis;  and 50% stenoses in the AV groove circumflex. -CVTS consult   Chronic systolic CHF with EF 25 to 30%.   -Fluid removal through PD.   Pressure Injury 06/20/20 Coccyx Medial Stage 1 -  Intact skin with non-blanchable redness of a localized area usually over a bony prominence. (Active)  06/20/20 1500  Location: Coccyx  Location Orientation: Medial  Staging: Stage 1 -  Intact skin with non-blanchable redness of a localized area usually over a bony prominence.  Wound Description (Comments):   Present on Admission: Yes       Family Communication/Anticipated D/C date and plan/Code Status   DVT prophylaxis: Lovenox ordered. Code Status: Full Code.  Disposition Plan: Status is: Inpatient  Remains inpatient appropriate because:Inpatient level of care appropriate due to severity of illness   Dispo: The patient is from: Home  Anticipated d/c is to: Home              Anticipated d/c date is: > 3 days              Patient currently is not medically stable to d/c.   Difficult to place patient No         Medical Consultants:    Cards  CVTS  Subjective:   Off floor for cath  Objective:    Vitals:   06/25/20 0945 06/25/20 0950 06/25/20 0955 06/25/20 1009  BP: 107/69 107/69  109/70  Pulse: (!) 103 (!) 0 (!) 0 96   Resp: (!) 7 (!) 0 (!) 0 16  Temp:    98.3 F (36.8 C)  TempSrc:    Oral  SpO2: 100% (!) 0% (!) 0% 98%  Weight:      Height:        Intake/Output Summary (Last 24 hours) at 06/25/2020 1318 Last data filed at 06/25/2020 6599 Gross per 24 hour  Intake 243 ml  Output --  Net 243 ml   Filed Weights   06/24/20 0500 06/25/20 0500 06/25/20 0650  Weight: 55.9 kg 51.1 kg 51.1 kg    Exam: Off floor for procedure  Data Reviewed:   I have personally reviewed following labs and imaging studies:  Labs: Labs show the following:   Basic Metabolic Panel: Recent Labs  Lab 06/21/20 1151 06/22/20 0139 06/22/20 2216 06/23/20 0057 06/24/20 0102 06/25/20 0104 06/25/20 1123  NA 126* 126*  --  130* 129* 131*  --   K 4.6 4.6  --  4.0 3.2* 3.5  --   CL 95* 96*  --  100 97* 98  --   CO2 16* 19*  --  18* 22 23  --   GLUCOSE 81 90  --  193* 106* 110*  --   BUN 34* 38*  --  36* 28* 27*  --   CREATININE 4.97* 5.26*  --  5.04* 4.19* 4.02* 4.05*  CALCIUM 8.0* 7.8*  --  8.0* 7.9* 7.9*  --   MG  --  2.0 1.9 1.9 1.7 1.5*  --    GFR Estimated Creatinine Clearance: 10.3 mL/min (A) (by C-G formula based on SCr of 4.05 mg/dL (H)). Liver Function Tests: Recent Labs  Lab 06/21/20 0623 06/22/20 0139 06/23/20 0057 06/24/20 0102 06/25/20 0104  AST 35 29 33 25 22  ALT 20 20 23 20 20   ALKPHOS 84 82 90 90 80  BILITOT 0.6 0.5 0.4 0.4 0.5  PROT 3.8* 3.5* 4.1* 3.9* 3.4*  ALBUMIN 1.8* 1.7* 1.9* 1.7* 1.4*   Recent Labs  Lab 06/20/20 0313  LIPASE 95*   No results for input(s): AMMONIA in the last 168 hours. Coagulation profile Recent Labs  Lab 06/19/20 1613  INR 1.1    CBC: Recent Labs  Lab 06/21/20 0623 06/22/20 0139 06/22/20 0704 06/22/20 1131 06/23/20 0057 06/24/20 0102 06/25/20 0104 06/25/20 1123  WBC 11.5* 10.9*   < > 14.1* 9.3 9.1 7.8 10.0  NEUTROABS 9.4* 8.6*  --   --  7.8* 6.7 5.6  --   HGB 7.4* 6.7*   < > 10.1* 9.9* 10.2* 8.7* 9.7*  HCT 21.3* 19.1*   < > 28.0* 27.9*  29.8* 24.0* 26.5*  MCV 88.4 87.2   < > 84.8 86.6 87.6 86.3 85.8  PLT 348 345   < > 307 263 278 216 271   < > = values in this interval not displayed.  Cardiac Enzymes: No results for input(s): CKTOTAL, CKMB, CKMBINDEX, TROPONINI in the last 168 hours. BNP (last 3 results) No results for input(s): PROBNP in the last 8760 hours. CBG: Recent Labs  Lab 06/19/20 1609  GLUCAP 140*   D-Dimer: Recent Labs    06/24/20 0102 06/25/20 0104  DDIMER 1.95* 1.87*   Hgb A1c: No results for input(s): HGBA1C in the last 72 hours. Lipid Profile: Recent Labs    06/24/20 0102  CHOL 170  HDL 88  LDLCALC 68  TRIG 68  CHOLHDL 1.9   Thyroid function studies: Recent Labs    06/22/20 2216  TSH 4.334   Anemia work up: No results for input(s): VITAMINB12, FOLATE, FERRITIN, TIBC, IRON, RETICCTPCT in the last 72 hours. Sepsis Labs: Recent Labs  Lab 06/23/20 0057 06/23/20 0652 06/23/20 0930 06/24/20 0102 06/25/20 0104 06/25/20 1123  WBC 9.3  --   --  9.1 7.8 10.0  LATICACIDVEN  --  4.2* 3.0*  --   --   --     Microbiology Recent Results (from the past 240 hour(s))  MRSA PCR Screening     Status: None   Collection Time: 06/21/20 12:34 AM   Specimen: Nasopharyngeal  Result Value Ref Range Status   MRSA by PCR NEGATIVE NEGATIVE Final    Comment:        The GeneXpert MRSA Assay (FDA approved for NASAL specimens only), is one component of a comprehensive MRSA colonization surveillance program. It is not intended to diagnose MRSA infection nor to guide or monitor treatment for MRSA infections. Performed at [facility], [place] [address], [place], [place] [address]     Procedures and diagnostic studies:  CARDIAC CATHETERIZATION  Result Date: 06/25/2020  Mid Cx lesion is 50% stenosed.  Dist Cx lesion is 50% stenosed.  Ramus lesion is 85% stenosed.  Prox LAD to Mid LAD lesion is 85% stenosed.  Mid LAD-1 lesion is 95% stenosed.  Mid LAD-2 lesion is 80% stenosed.  1st  Diag lesion is 90% stenosed.  Severe multivessel CAD with diffuse 80%, 95% and 80% proximal LAD stenosis, 90% diagonal stenosis;  85% focal ramus intermediate stenosis;  and 50% stenoses in the AV groove circumflex. The right coronary artery is a dominant normal vessel. LVEDP 8 mmHg RECOMMENDATION: With severe LV dysfunction with EF of 25% and diffuse artery disease recommend initial surgical consultation consideration of CABG revascularization.  If we will to perform surgery, then will need an interventional approach to the LAD and ramus.  Will review with colleagues.   ECHOCARDIOGRAM COMPLETE  Result Date: 06/23/2020    ECHOCARDIOGRAM REPORT   Patient Name:   Amy [name] [facility] Date of Exam: 06/23/2020 Medical Rec #:  [id number]     Height:       64.0 in Accession #:    [id number]    Weight:       132.7 lb Date of Birth:  1951-03-25     BSA:          1.643 m Patient Age:    65 years      BP:           102/70 mmHg Patient Gender: F             HR:           106 bpm. Exam Location:  Inpatient Procedure: 2D Echo Indications:    elevated troponin  History:        Patient has prior history of Echocardiogram examinations,  most                 recent 04/04/2019. End stage renal disease; Risk                 Factors:Hypertension and Dyslipidemia.  Sonographer:    [name] Referring Phys: [place]  1. LV function is severerly depressed The very proximal portion of LV is vigorous. There is akinesis of the distal lateral, mid/distal septal, mid/dital inferior, distal anterior and apical walls are akinetic. Compared to echo report from Dec, 2020 wall  motion abnormalities and depressed LVEF are new.. Left ventricular ejection fraction, by estimation, is 25 to 30%. The left ventricle has severely decreased function. Left ventricular diastolic parameters are indeterminate.  2. Akinesis of the apex.. Right ventricular systolic function is mildly reduced. The right ventricular size is normal.  There is normal pulmonary artery systolic pressure.  3. The mitral valve is abnormal. Mild mitral valve regurgitation.  4. The aortic valve is abnormal. Aortic valve regurgitation is not visualized. Mild aortic valve sclerosis is present, with no evidence of aortic valve stenosis. FINDINGS  Left Ventricle: LV function is severerly depressed The very proximal portion of LV is vigorous. There is akinesis of the distal lateral, mid/distal septal, mid/dital inferior, distal anterior and apical walls are akinetic. Compared to echo report from Dec, 2020 wall motion abnormalities and depressed LVEF are new. Left ventricular ejection fraction, by estimation, is 25 to 30%. The left ventricle has severely decreased function. The left ventricular internal cavity size was normal in size. There is no  left ventricular hypertrophy. Left ventricular diastolic parameters are indeterminate. Right Ventricle: Akinesis of the apex. The right ventricular size is normal. Right vetricular wall thickness was not assessed. Right ventricular systolic function is mildly reduced. There is normal pulmonary artery systolic pressure. The tricuspid regurgitant velocity is 2.71 m/s, and with an assumed right atrial pressure of 3 mmHg, the estimated right ventricular systolic pressure is 93.5 mmHg. Left Atrium: Left atrial size was normal in size. Right Atrium: Right atrial size was normal in size. Pericardium: There is no evidence of pericardial effusion. Mitral Valve: The mitral valve is abnormal. There is mild thickening of the mitral valve leaflet(s). Mild mitral annular calcification. Mild mitral valve regurgitation. Tricuspid Valve: The tricuspid valve is normal in structure. Tricuspid valve regurgitation is trivial. Aortic Valve: The aortic valve is abnormal. Aortic valve regurgitation is not visualized. Mild aortic valve sclerosis is present, with no evidence of aortic valve stenosis. Pulmonic Valve: The pulmonic valve was normal in  structure. Pulmonic valve regurgitation is not visualized. Aorta: The aortic root is normal in size and structure. IAS/Shunts: The interatrial septum was not assessed.  LEFT VENTRICLE PLAX 2D LVIDd:         5.10 cm     Diastology LVIDs:         4.00 cm     LV e' lateral: 4.24 cm/s LV PW:         1.00 cm LV IVS:        0.80 cm LVOT diam:     1.80 cm LV SV:         43 LV SV Index:   26 LVOT Area:     2.54 cm  LV Volumes (MOD) LV vol d, MOD A2C: 64.3 ml LV vol d, MOD A4C: 90.6 ml LV vol s, MOD A2C: 48.8 ml LV vol s, MOD A4C: 69.1 ml LV SV MOD A2C:  15.5 ml LV SV MOD A4C:     90.6 ml LV SV MOD BP:      20.1 ml RIGHT VENTRICLE             IVC RV S prime:     13.30 cm/s  IVC diam: 1.20 cm TAPSE (M-mode): 1.4 cm LEFT ATRIUM             Index       RIGHT ATRIUM          Index LA diam:        3.30 cm 2.01 cm/m  RA Area:     8.93 cm LA Vol (A2C):   79.4 ml 48.31 ml/m RA Volume:   15.60 ml 9.49 ml/m LA Vol (A4C):   42.0 ml 25.56 ml/m LA Biplane Vol: 64.2 ml 39.06 ml/m  AORTIC VALVE LVOT Vmax:   91.40 cm/s LVOT Vmean:  63.500 cm/s LVOT VTI:    0.170 m  AORTA Ao Root diam: 3.20 cm Ao Asc diam:  3.20 cm TRICUSPID VALVE TR Peak grad:   29.4 mmHg TR Vmax:        271.00 cm/s  SHUNTS Systemic VTI:  0.17 m Systemic Diam: 1.80 cm Dorris Carnes MD Electronically signed by Dorris Carnes MD Signature Date/Time: 06/23/2020/7:19:28 PM    Final    VAS [place] LOWER EXTREMITY VENOUS (DVT)  Result Date: 06/24/2020  Lower Venous DVT Study Other Indications: D-DImer. Risk Factors: None identified. Comparison Study: 5/18 Negative Performing Technologist: Vonzell Schlatter RVT  Examination Guidelines: A complete evaluation includes B-mode imaging, spectral Doppler, color Doppler, and power Doppler as needed of all accessible portions of each vessel. Bilateral testing is considered an integral part of a complete examination. Limited examinations for reoccurring indications may be performed as noted. The reflux portion of the exam is performed with the  patient in reverse Trendelenburg.  +---------+---------------+---------+-----------+----------+--------------+ RIGHT    CompressibilityPhasicitySpontaneityPropertiesThrombus Aging +---------+---------------+---------+-----------+----------+--------------+ CFV      Full           Yes      Yes                                 +---------+---------------+---------+-----------+----------+--------------+ SFJ      Full                                                        +---------+---------------+---------+-----------+----------+--------------+ FV Prox  Full                                                        +---------+---------------+---------+-----------+----------+--------------+ FV Mid   Full                                                        +---------+---------------+---------+-----------+----------+--------------+ FV DistalFull                                                        +---------+---------------+---------+-----------+----------+--------------+  PFV      Full                                                        +---------+---------------+---------+-----------+----------+--------------+ POP      Full           Yes      Yes                                 +---------+---------------+---------+-----------+----------+--------------+ PTV      Full                                                        +---------+---------------+---------+-----------+----------+--------------+ PERO     Full                                                        +---------+---------------+---------+-----------+----------+--------------+   +---------+---------------+---------+-----------+----------+--------------+ LEFT     CompressibilityPhasicitySpontaneityPropertiesThrombus Aging +---------+---------------+---------+-----------+----------+--------------+ CFV      Full           Yes      Yes                                  +---------+---------------+---------+-----------+----------+--------------+ SFJ      Full                                                        +---------+---------------+---------+-----------+----------+--------------+ FV Prox  Full                                                        +---------+---------------+---------+-----------+----------+--------------+ FV Mid   Full                                                        +---------+---------------+---------+-----------+----------+--------------+ FV DistalFull                                                        +---------+---------------+---------+-----------+----------+--------------+ PFV      Full                                                        +---------+---------------+---------+-----------+----------+--------------+   POP      Full           Yes      Yes                                 +---------+---------------+---------+-----------+----------+--------------+ PTV      Full                                                        +---------+---------------+---------+-----------+----------+--------------+ PERO     Full                                                        +---------+---------------+---------+-----------+----------+--------------+  Summary: RIGHT: - There is no evidence of deep vein thrombosis in the lower extremity.  - A cystic structure is found in the popliteal fossa. - wall thickening was noted in grey scale images indicating possible post-phlebitic changes  LEFT: - wall thickening was noted in greyscale indicating possible post-phlebitic changes  *See table(s) above for measurements and observations. Electronically signed by Deitra Mayo MD on 06/24/2020 at 6:29:53 PM.    Final     Medications:   . sodium chloride   Intravenous Once  . aspirin  81 mg Oral Daily  . atorvastatin  80 mg Oral q1800  . calcitRIOL  0.5 mcg Oral Daily  . Chlorhexidine Gluconate  Cloth  6 each Topical Daily  . cinacalcet  30 mg Oral Q breakfast  . darbepoetin (ARANESP) injection - NON-DIALYSIS  60 mcg Subcutaneous Q Sat-1800  . [START ON 06/26/2020] enoxaparin (LOVENOX) injection  30 mg Subcutaneous Q24H  . feeding supplement (NEPRO CARB STEADY)  237 mL Oral BID BM  . fidaxomicin  200 mg Oral BID  . gentamicin cream  1 application Topical Daily  . metoprolol tartrate  25 mg Oral BID  . multivitamin  1 tablet Oral QHS  . sodium chloride flush  3 mL Intravenous Once  . sodium chloride flush  3 mL Intravenous Q12H  . sodium chloride flush  3 mL Intravenous Q12H   Continuous Infusions: . sodium chloride    . levETIRAcetam 500 mg (06/25/20 0509)     LOS: 6 days   Geradine Girt  [facility]   How to contact the [facility] Attending or Consulting provider [place] or covering provider during after hours [place], for this patient?  1. Check the care team in [facility] and look for a) attending/consulting [facility] provider listed and b) the [facility] team listed 2. Log into [email or web address] and use [place]'s universal password to access. If you do not have the password, please contact the hospital operator. 3. Locate the [facility] provider you are looking for under [facility] and page to a number that you can be directly reached. 4. If you still have difficulty reaching the provider, please page the [facility] (Director on Call) for the Hospitalists listed on amion for assistance.  06/25/2020, 1:18 PM

## 2020-06-25 NOTE — Progress Notes (Signed)
PT Cancellation Note  Patient Details Name: Amy [name] MRN: [id number] DOB: Apr 23, 1951   Cancelled Treatment:    Reason Eval/Treat Not Completed: (P) Medical issues which prohibited therapy Pt with low BP and high HR after heart cath. Scheduled for CABG tomorrow. PT will follow back for Reevaluation after surgery.  Sarie Stall B. Migdalia Dk PT, DPT Acute Rehabilitation Services Pager [phone] Office [phone]    [place] 06/25/2020, 4:53 PM

## 2020-06-25 NOTE — Progress Notes (Signed)
TCTS consulted for CABG evaluation. °

## 2020-06-25 NOTE — Progress Notes (Incomplete)
Pt started bleeding from

## 2020-06-25 NOTE — Progress Notes (Signed)
Progress Note  Patient Name: Amy [name] Date of Encounter: 06/25/2020  [facility] Cardiologist: Ida Rogue, MD   Subjective   Feeling tired.  No chest pain or shortness of breath.  Inpatient Medications    Scheduled Meds: . sodium chloride   Intravenous Once  . aspirin EC  81 mg Oral Daily  . calcitRIOL  0.5 mcg Oral Daily  . Chlorhexidine Gluconate Cloth  6 each Topical Daily  . cinacalcet  30 mg Oral Q breakfast  . darbepoetin (ARANESP) injection - NON-DIALYSIS  60 mcg Subcutaneous Q Sat-1800  . feeding supplement (NEPRO CARB STEADY)  237 mL Oral BID BM  . fidaxomicin  200 mg Oral BID  . gentamicin cream  1 application Topical Daily  . heparin  5,000 Units Subcutaneous Q8H  . metoprolol tartrate  25 mg Oral BID  . multivitamin  1 tablet Oral QHS  . potassium chloride  20 mEq Oral Once  . sodium chloride flush  3 mL Intravenous Once  . sodium chloride flush  3 mL Intravenous Q12H   Continuous Infusions: . sodium chloride    . sodium chloride 10 mL/hr at 06/25/20 0611  . levETIRAcetam 500 mg (06/25/20 0509)  . magnesium sulfate bolus IVPB 2 g (06/25/20 0727)   PRN Meds: sodium chloride, acetaminophen **OR** [DISCONTINUED] acetaminophen, Gerhardt's butt cream, dianeal solution for CAPD/[name] with heparin, sodium chloride flush, traZODone   Vital Signs    Vitals:   06/24/20 2331 06/25/20 0324 06/25/20 0500 06/25/20 0650  BP: 96/65 101/61  108/70  Pulse: (!) 104 96    Resp: 14 12  13   Temp: 98.4 F (36.9 C) 98.4 F (36.9 C)  98.8 F (37.1 C)  TempSrc: Oral Oral  Oral  SpO2: 97% 94%  97%  Weight:   51.1 kg 51.1 kg  Height:        Intake/Output Summary (Last 24 hours) at 06/25/2020 0815 Last data filed at 06/25/2020 5366 Gross per 24 hour  Intake 243 ml  Output --  Net 243 ml   Last 3 Weights 06/25/2020 06/25/2020 06/24/2020  Weight (lbs) 112 lb 10.5 oz 112 lb 10.5 oz 123 lb 3.8 oz  Weight (kg) 51.1 kg 51.1 kg 55.9 kg      Telemetry    Sinus  rhythm.   Personally Reviewed  ECG    N/A- Personally Reviewed  Physical Exam   VS:  BP 108/70 (BP Location: Right Arm)   Pulse 96   Temp 98.8 F (37.1 C) (Oral)   Resp 13   Ht 5\' 4"  (1.626 m)   Wt 51.1 kg   SpO2 97%   BMI 19.34 kg/m  , BMI Body mass index is 19.34 kg/m. GENERAL: Chronically ill-appearing.  No acute distress. HEENT: Pupils equal round and reactive, fundi not visualized, oral mucosa unremarkable.  Cushingoid face NECK:  No jugular venous distention, waveform within normal limits, carotid upstroke brisk and symmetric LUNGS:  Clear to auscultation bilaterally HEART:  RRR.  PMI not displaced or sustained,S1 and S2 within normal limits, no S3, no S4, no clicks, no rubs, no murmurs ABD:  Flat, positive bowel sounds normal in frequency in pitch, no bruits, no rebound, no guarding, no midline pulsatile mass, no hepatomegaly, no splenomegaly EXT:  2 plus pulses throughout, +L UE edema.  No LE edema, no cyanosis no clubbing SKIN:  No rashes no nodules NEURO:  Cranial nerves II through XII grossly intact, motor grossly intact throughout PSYCH:  Cognitively intact, oriented to  person place and time   Labs    High Sensitivity Troponin:   Recent Labs  Lab 05/29/20 1215 06/23/20 0454 06/23/20 0652 06/23/20 0930  TROPONINIHS 25* 1,978* 1,554* 1,397*      Chemistry Recent Labs  Lab 06/23/20 0057 06/24/20 0102 06/25/20 0104  NA 130* 129* 131*  K 4.0 3.2* 3.5  CL 100 97* 98  CO2 18* 22 23  GLUCOSE 193* 106* 110*  BUN 36* 28* 27*  CREATININE 5.04* 4.19* 4.02*  CALCIUM 8.0* 7.9* 7.9*  PROT 4.1* 3.9* 3.4*  ALBUMIN 1.9* 1.7* 1.4*  AST 33 25 22  ALT 23 20 20   ALKPHOS 90 90 80  BILITOT 0.4 0.4 0.5  GFRNONAA 9* 11* 11*  ANIONGAP 12 10 10      Hematology Recent Labs  Lab 06/23/20 0057 06/24/20 0102 06/25/20 0104  WBC 9.3 9.1 7.8  RBC 3.22* 3.40* 2.78*  HGB 9.9* 10.2* 8.7*  HCT 27.9* 29.8* 24.0*  MCV 86.6 87.6 86.3  MCH 30.7 30.0 31.3  MCHC 35.5 34.2  36.3*  RDW 17.8* 18.1* 18.2*  PLT 263 278 216    BNP Recent Labs  Lab 06/23/20 0057 06/24/20 0102 06/25/20 0104  BNP 1,142.5* 1,625.0* 1,416.4*     DDimer  Recent Labs  Lab 06/23/20 0057 06/24/20 0102 06/25/20 0104  DDIMER 1.98* 1.95* 1.87*     Radiology    CT ANGIO CHEST PE W OR WO CONTRAST  Result Date: 06/23/2020 CLINICAL DATA:  Tachycardia. Elevated D-dimer. Pulmonary embolism suspected. Recent right hip ORIF. EXAM: CT ANGIOGRAPHY CHEST WITH CONTRAST TECHNIQUE: Multidetector CT imaging of the chest was performed using the standard protocol during bolus administration of intravenous contrast. Multiplanar CT image reconstructions and MIPs were obtained to evaluate the vascular anatomy. CONTRAST:  49mL OMNIPAQUE IOHEXOL 350 MG/ML SOLN COMPARISON:  CT 04/06/2017, x-ray 06/22/2020 FINDINGS: Cardiovascular: Satisfactory opacification of the pulmonary arteries to the segmental level. No evidence of pulmonary embolism. Thoracic aorta is nonaneurysmal. Scattered atherosclerotic calcification of the aorta and coronary arteries. Normal heart size. No pericardial effusion. Mediastinum/Nodes: No axillary, mediastinal, or hilar lymphadenopathy. Trachea and esophagus within normal limits. Redemonstration of multiple thyroid lobe nodules measuring up to 1.5 cm in the right thyroid lobe and 1.7 cm in the region of the thyroid isthmus. Isthmus nodule has slightly increased in size from prior CT. Lungs/Pleura: Small left and trace right pleural effusions with associated compressive atelectasis. Lungs are otherwise clear. No pneumothorax. Upper Abdomen: Small volume ascites within the visualized upper abdomen. A small portion of the colon is visualized in the region of the splenic flexure with associated colonic wall thickening (series 5, image 92). Musculoskeletal: Mild anasarca.  No acute osseous findings. Review of the MIP images confirms the above findings. IMPRESSION: 1. Negative for pulmonary  embolism. 2. Findings of volume overload with bilateral pleural effusions, ascites, and anasarca. 3. A small portion of the colon is visualized in the region of the splenic flexure with associated colonic wall thickening. Findings suggestive of colitis in the setting of known Clostridium difficile infection. 4. Redemonstration of multiple thyroid lobe nodules. Isthmus nodule has slightly increased in size from prior CT. Recommend thyroid US (ref: J Am Coll Radiol. 2015 Feb;12(2): 143-50). 5. Aortic and coronary artery atherosclerosis (ICD10-I70.0). Electronically Signed   By: Davina Poke D.O.   On: 06/23/2020 11:26   ECHOCARDIOGRAM COMPLETE  Result Date: 06/23/2020    ECHOCARDIOGRAM REPORT   Patient Name:   Amy [name] [facility] Date of Exam: 06/23/2020 Medical Rec #:  [id number]     Height:       64.0 in Accession #:    [id number]    Weight:       132.7 lb Date of Birth:  1950-09-16     BSA:          1.643 m Patient Age:    70 years      BP:           102/70 mmHg Patient Gender: F             HR:           106 bpm. Exam Location:  Inpatient Procedure: 2D Echo Indications:    elevated troponin  History:        Patient has prior history of Echocardiogram examinations, most                 recent 04/04/2019. End stage renal disease; Risk                 Factors:Hypertension and Dyslipidemia.  Sonographer:    [name] Referring Phys: [place]  1. LV function is severerly depressed The very proximal portion of LV is vigorous. There is akinesis of the distal lateral, mid/distal septal, mid/dital inferior, distal anterior and apical walls are akinetic. Compared to echo report from Dec, 2020 wall  motion abnormalities and depressed LVEF are new.. Left ventricular ejection fraction, by estimation, is 25 to 30%. The left ventricle has severely decreased function. Left ventricular diastolic parameters are indeterminate.  2. Akinesis of the apex.. Right ventricular systolic function is mildly  reduced. The right ventricular size is normal. There is normal pulmonary artery systolic pressure.  3. The mitral valve is abnormal. Mild mitral valve regurgitation.  4. The aortic valve is abnormal. Aortic valve regurgitation is not visualized. Mild aortic valve sclerosis is present, with no evidence of aortic valve stenosis. FINDINGS  Left Ventricle: LV function is severerly depressed The very proximal portion of LV is vigorous. There is akinesis of the distal lateral, mid/distal septal, mid/dital inferior, distal anterior and apical walls are akinetic. Compared to echo report from Dec, 2020 wall motion abnormalities and depressed LVEF are new. Left ventricular ejection fraction, by estimation, is 25 to 30%. The left ventricle has severely decreased function. The left ventricular internal cavity size was normal in size. There is no  left ventricular hypertrophy. Left ventricular diastolic parameters are indeterminate. Right Ventricle: Akinesis of the apex. The right ventricular size is normal. Right vetricular wall thickness was not assessed. Right ventricular systolic function is mildly reduced. There is normal pulmonary artery systolic pressure. The tricuspid regurgitant velocity is 2.71 m/s, and with an assumed right atrial pressure of 3 mmHg, the estimated right ventricular systolic pressure is 71.2 mmHg. Left Atrium: Left atrial size was normal in size. Right Atrium: Right atrial size was normal in size. Pericardium: There is no evidence of pericardial effusion. Mitral Valve: The mitral valve is abnormal. There is mild thickening of the mitral valve leaflet(s). Mild mitral annular calcification. Mild mitral valve regurgitation. Tricuspid Valve: The tricuspid valve is normal in structure. Tricuspid valve regurgitation is trivial. Aortic Valve: The aortic valve is abnormal. Aortic valve regurgitation is not visualized. Mild aortic valve sclerosis is present, with no evidence of aortic valve stenosis. Pulmonic  Valve: The pulmonic valve was normal in structure. Pulmonic valve regurgitation is not visualized. Aorta: The aortic root is normal in size and structure. IAS/Shunts: The interatrial septum was not assessed.  LEFT VENTRICLE PLAX 2D LVIDd:         5.10 cm     Diastology LVIDs:         4.00 cm     LV e' lateral: 4.24 cm/s LV PW:         1.00 cm LV IVS:        0.80 cm LVOT diam:     1.80 cm LV SV:         43 LV SV Index:   26 LVOT Area:     2.54 cm  LV Volumes (MOD) LV vol d, MOD A2C: 64.3 ml LV vol d, MOD A4C: 90.6 ml LV vol s, MOD A2C: 48.8 ml LV vol s, MOD A4C: 69.1 ml LV SV MOD A2C:     15.5 ml LV SV MOD A4C:     90.6 ml LV SV MOD BP:      20.1 ml RIGHT VENTRICLE             IVC RV S prime:     13.30 cm/s  IVC diam: 1.20 cm TAPSE (M-mode): 1.4 cm LEFT ATRIUM             Index       RIGHT ATRIUM          Index LA diam:        3.30 cm 2.01 cm/m  RA Area:     8.93 cm LA Vol (A2C):   79.4 ml 48.31 ml/m RA Volume:   15.60 ml 9.49 ml/m LA Vol (A4C):   42.0 ml 25.56 ml/m LA Biplane Vol: 64.2 ml 39.06 ml/m  AORTIC VALVE LVOT Vmax:   91.40 cm/s LVOT Vmean:  63.500 cm/s LVOT VTI:    0.170 m  AORTA Ao Root diam: 3.20 cm Ao Asc diam:  3.20 cm TRICUSPID VALVE TR Peak grad:   29.4 mmHg TR Vmax:        271.00 cm/s  SHUNTS Systemic VTI:  0.17 m Systemic Diam: 1.80 cm Dorris Carnes MD Electronically signed by Dorris Carnes MD Signature Date/Time: 06/23/2020/7:19:28 PM    Final    VAS [place] LOWER EXTREMITY VENOUS (DVT)  Result Date: 06/24/2020  Lower Venous DVT Study Other Indications: D-DImer. Risk Factors: None identified. Comparison Study: 5/18 Negative Performing Technologist: Vonzell Schlatter RVT  Examination Guidelines: A complete evaluation includes B-mode imaging, spectral Doppler, color Doppler, and power Doppler as needed of all accessible portions of each vessel. Bilateral testing is considered an integral part of a complete examination. Limited examinations for reoccurring indications may be performed as noted. The reflux  portion of the exam is performed with the patient in reverse Trendelenburg.  +---------+---------------+---------+-----------+----------+--------------+ RIGHT    CompressibilityPhasicitySpontaneityPropertiesThrombus Aging +---------+---------------+---------+-----------+----------+--------------+ CFV      Full           Yes      Yes                                 +---------+---------------+---------+-----------+----------+--------------+ SFJ      Full                                                        +---------+---------------+---------+-----------+----------+--------------+ FV Prox  Full                                                        +---------+---------------+---------+-----------+----------+--------------+  FV Mid   Full                                                        +---------+---------------+---------+-----------+----------+--------------+ FV DistalFull                                                        +---------+---------------+---------+-----------+----------+--------------+ PFV      Full                                                        +---------+---------------+---------+-----------+----------+--------------+ POP      Full           Yes      Yes                                 +---------+---------------+---------+-----------+----------+--------------+ PTV      Full                                                        +---------+---------------+---------+-----------+----------+--------------+ PERO     Full                                                        +---------+---------------+---------+-----------+----------+--------------+   +---------+---------------+---------+-----------+----------+--------------+ LEFT     CompressibilityPhasicitySpontaneityPropertiesThrombus Aging +---------+---------------+---------+-----------+----------+--------------+ CFV      Full           Yes      Yes                                  +---------+---------------+---------+-----------+----------+--------------+ SFJ      Full                                                        +---------+---------------+---------+-----------+----------+--------------+ FV Prox  Full                                                        +---------+---------------+---------+-----------+----------+--------------+ FV Mid   Full                                                        +---------+---------------+---------+-----------+----------+--------------+  FV DistalFull                                                        +---------+---------------+---------+-----------+----------+--------------+ PFV      Full                                                        +---------+---------------+---------+-----------+----------+--------------+ POP      Full           Yes      Yes                                 +---------+---------------+---------+-----------+----------+--------------+ PTV      Full                                                        +---------+---------------+---------+-----------+----------+--------------+ PERO     Full                                                        +---------+---------------+---------+-----------+----------+--------------+  Summary: RIGHT: - There is no evidence of deep vein thrombosis in the lower extremity.  - A cystic structure is found in the popliteal fossa. - wall thickening was noted in grey scale images indicating possible post-phlebitic changes  LEFT: - wall thickening was noted in greyscale indicating possible post-phlebitic changes  *See table(s) above for measurements and observations. Electronically signed by Deitra Mayo MD on 06/24/2020 at 6:29:53 PM.    Final     Cardiac Studies   Echo 06/23/20: 1. LV function is severerly depressed The very proximal portion of LV is  vigorous. There is akinesis of the distal  lateral, mid/distal septal,  mid/dital inferior, distal anterior and apical walls are akinetic.  Compared to echo report from Dec, 2020 wall  motion abnormalities and depressed LVEF are new.. Left ventricular  ejection fraction, by estimation, is 25 to 30%. The left ventricle has  severely decreased function. Left ventricular diastolic parameters are  indeterminate.  2. Akinesis of the apex.. Right ventricular systolic function is mildly  reduced. The right ventricular size is normal. There is normal pulmonary  artery systolic pressure.  3. The mitral valve is abnormal. Mild mitral valve regurgitation.  4. The aortic valve is abnormal. Aortic valve regurgitation is not  visualized. Mild aortic valve sclerosis is present, with no evidence of  aortic valve stenosis.   Patient Profile     Ms. Paro is a 89F with ESRD on PD, HTN, anemia of chronic disease, and atrial flutter admitted with seizure in the setting of profound metabolic derangements.  She has been treated for C. Diff but had refractory diarrhea.  She was also recently COVID-19 positive.  Her husband brought her to the hospital with seizure like activity  and weakness.  Cardiology was consulted for elevated troponin and tachycardia.      Assessment & Plan    # Elevated troponin:  HS-troponin was elevated to 1978-->1554-->1397.  She denies chest pain.  During this hospitalization she was also noted to have an elevated D-dimer.  Chest CT-A was negative for pulmonary embolism.  It did reveal coronary calcification.  She is also had worsening anemia this admission requiring transfusion.    H/H has been stable since that time.  Likely due to anemia of chronic disease.  Plan for diagnostic LHC.  Not a great candidate for PCI at this time.  Given her extensive calcification noted on CT, elevated cardiac enzymes and wall motion abnormalities on echo, recommend getting a diagnostic cath today.  She and her husband are in agreement with this  plan.     # Acute systolic and diastolic heart failure: Echo concerning for stress cardiomyopathy.  Plan for LHC today to rule out obstructive CAD.  Consolidate metoprolol to succinate.  Resume home ARB when BP allows.   # Sinus tachycardia: This admission she has persistent sinus tachycardia, but no atrial flutter has been detected.  Her tachycardia has resolved.  Continue metoprolol.  Likely 2/2 her acute illness.   # Atrial flutter: Ms. Igoe has a history of atrial flutter 04/2019.  This was thought to be due to acute illness and the time and she was not treated on longstanding anticoagulation due to her anemia.         For questions or updates, please contact [place] Please consult [email or web address] for contact info under        Signed, Skeet Latch, MD  06/25/2020, 8:15 AM

## 2020-06-25 NOTE — Progress Notes (Signed)
PT Cancellation Note  Patient Details Name: Amy [name] MRN: [id number] DOB: [birth year]   Cancelled Treatment:    Reason Eval/Treat Not Completed: (P) Patient at procedure or test/unavailable Pt is off floor in cath lab. PT will follow back this afternoon as able.  Elizabeth B. Migdalia Dk PT, DPT Acute Rehabilitation Services Pager [phone] Office [phone]    [place] 06/25/2020, 9:45 AM

## 2020-06-25 NOTE — Progress Notes (Signed)
Nephrology Follow-Up Consult note   Assessment/Recommendations: Amy [name] is a/an 70 y.o. female with a past medical history significant for ESRD on PD admitted with multiple electrolyte derangements, diarrhea, C. difficile, possible seizure.   # ESRD:  - Continue PD - all 1.5% again tonight  # Volume/ hypertension:   - resumed 1.5% per pt's prior regimen   # Anemia of Chronic Kidney Disease:  on ESA - aranesp 60 mcg every sat  # Secondary Hyperparathyroidism/Hyperphosphatemia: Continue calcitriol 0.5 MCG daily and Sensipar 30 mg daily. Phos in am  #Hyponatremia: Hyponatremia.  Likely related to volume excess.  PD as above.  #Hypokalemia: replete  - potassium 20 meq once this am and replete mag  #Acidosis: Mild metabolic acidosis likely related to diarrhea. stop oral bicarbonate   #Possible seizure: Receiving Keppra.    Neurology has been consulted  # Elevated troponin - per cardiology.  Transitioned back to standard gentle UF per pt's normal regimen; on chart review note had a more aggressive regimen Sunday after missing PD. Work-up per cardiology  ________________________________________________________   Interval History/Subjective: had PD overnight.  Had 2 urine voids over 2/22.  PD went ok.  States may have heart procedure today. Normally uses all 1.5 % dextrose at home.   On 2/22 am had 483 ml UF Note she had 2.9 liters UF on 2/21 am with PD.   Review of systems:    Denies shortness of breath or chest pain  Denies n/v Diarrhea seems better  Medications:  Current Facility-Administered Medications  Medication Dose Route Frequency Provider Last Rate Last Admin   0.9 %  sodium chloride infusion (Manually program via Guardrails IV Fluids)   Intravenous Once Thurnell Lose, MD   Held at 06/22/20 0809   0.9 %  sodium chloride infusion  250 mL Intravenous PRN Kathyrn Drown D, NP       0.9 %  sodium chloride infusion   Intravenous Continuous Kathyrn Drown D, NP 10  mL/hr at 06/25/20 0611 New Bag at 06/25/20 0611   acetaminophen (TYLENOL) tablet 650 mg  650 mg Oral Q6H PRN Neena Rhymes, MD       aspirin EC tablet 81 mg  81 mg Oral Daily Rise Patience, MD   81 mg at 06/24/20 1042   butalbital-acetaminophen-caffeine (FIORICET) 50-325-40 MG per tablet 1-2 tablet  1-2 tablet Oral Q8H PRN Thurnell Lose, MD   1 tablet at 06/24/20 2129   calcitRIOL (ROCALTROL) capsule 0.5 mcg  0.5 mcg Oral Daily Reesa Chew, MD   0.5 mcg at 06/24/20 1042   Chlorhexidine Gluconate Cloth 2 % PADS 6 each  6 each Topical Daily Norins, Heinz Knuckles, MD   6 each at 06/24/20 1043   cinacalcet (SENSIPAR) tablet 30 mg  30 mg Oral Q breakfast Reesa Chew, MD   30 mg at 06/24/20 6712   Darbepoetin Alfa (ARANESP) injection 60 mcg  60 mcg Subcutaneous Q Sat-1800 Reesa Chew, MD       feeding supplement (NEPRO CARB STEADY) liquid 237 mL  237 mL Oral BID BM Thurnell Lose, MD   237 mL at 06/24/20 1807   fidaxomicin (DIFICID) tablet 200 mg  200 mg Oral BID Neena Rhymes, MD   200 mg at 06/24/20 2115   gentamicin cream (GARAMYCIN) 0.1 % 1 application  1 application Topical Daily Reesa Chew, MD   1 application at 45/80/99 1043   Gerhardt's butt cream   Topical PRN Candiss Norse,  Margaree Mackintosh, MD   Given at 06/22/20 1616   heparin 2,500 Units in dialysis solution 2.5% low-MG/low-CA 5,000 mL dialysis solution   Peritoneal Dialysis PRN Reesa Chew, MD       heparin injection 5,000 Units  5,000 Units Subcutaneous Q8H Norins, Heinz Knuckles, MD   5,000 Units at 06/25/20 6546   levETIRAcetam (KEPPRA) IVPB 500 mg/100 mL premix  500 mg Intravenous Q12H Perfecto Kingdom, MD 400 mL/hr at 06/25/20 0509 500 mg at 06/25/20 0509   metoprolol tartrate (LOPRESSOR) tablet 25 mg  25 mg Oral BID Thurnell Lose, MD   25 mg at 06/24/20 1042   multivitamin (RENA-VIT) tablet 1 tablet  1 tablet Oral QHS Thurnell Lose, MD   1 tablet at 06/24/20 2115   sodium bicarbonate  tablet 650 mg  650 mg Oral TID Thurnell Lose, MD   650 mg at 06/24/20 2115   sodium chloride flush (NS) 0.9 % injection 3 mL  3 mL Intravenous Once Carmin Muskrat, MD       sodium chloride flush (NS) 0.9 % injection 3 mL  3 mL Intravenous Q12H Kathyrn Drown D, NP   3 mL at 06/24/20 2119   sodium chloride flush (NS) 0.9 % injection 3 mL  3 mL Intravenous PRN Tommie Raymond, NP       traZODone (DESYREL) tablet 25 mg  25 mg Oral QHS PRN Neena Rhymes, MD   25 mg at 06/24/20 2128      Physical Exam: Vitals:   06/24/20 2331 06/25/20 0324  BP: 96/65 101/61  Pulse: (!) 104 96  Resp: 14 12  Temp: 98.4 F (36.9 C) 98.4 F (36.9 C)  SpO2: 97% 94%   Total I/O In: 3 [I.V.:3] Out: -   Intake/Output Summary (Last 24 hours) at 06/25/2020 5035 Last data filed at 06/25/2020 4656 Gross per 24 hour  Intake 243 ml  Output --  Net 243 ml    General adult female in bed in no acute distress   HEENT normocephalic atraumatic extraocular movements intact sclera anicteric Neck supple trachea midline Lungs clear to auscultation bilaterally normal work of breathing at rest  Heart S1s2 no rub Abdomen soft nontender nondistended Extremities no edema  Psych normal mood and affect Neuro alert and oriented x 3 provides hx and follows commands Access: tenckhoff dressing intact, clean  Test Results I personally reviewed new and old clinical labs and radiology tests Lab Results  Component Value Date   NA 131 (L) 06/25/2020   K 3.5 06/25/2020   CL 98 06/25/2020   CO2 23 06/25/2020   BUN 27 (H) 06/25/2020   CREATININE 4.02 (H) 06/25/2020   GLU 103 04/30/2014   CALCIUM 7.9 (L) 06/25/2020   ALBUMIN 1.4 (L) 06/25/2020   PHOS 6.1 (H) 11/08/2018     Outpt prescription Access  PD Catheter Right lower quadrant   Schedule  QD   PD Type  PD with Cycler   Vendor  [id number]   Calcium  2.50 mEq   Magnesium  0.5 mEq   Dextrose  Varied   Dextrose Instructions  Dextrose strength dependant on  BP, weight, and edema.   Total Fills: Dry (Checked)  4   Total Fills: Dry (Checked) Details  4 Fills per Cycle @ 2000 ml   Cycle  Day   Cycle Drain Time  20 min   Cycle Fill Time  10 min   Cycle Dwell Time  1 hrs 49 min  Claudia Desanctis, MD 06/25/2020 6:37 AM

## 2020-06-26 ENCOUNTER — Inpatient Hospital Stay (HOSPITAL_COMMUNITY): Payer: Medicare Other

## 2020-06-26 ENCOUNTER — Telehealth: Payer: Self-pay | Admitting: Family Medicine

## 2020-06-26 DIAGNOSIS — N186 End stage renal disease: Secondary | ICD-10-CM | POA: Diagnosis not present

## 2020-06-26 DIAGNOSIS — I5043 Acute on chronic combined systolic (congestive) and diastolic (congestive) heart failure: Secondary | ICD-10-CM | POA: Diagnosis not present

## 2020-06-26 DIAGNOSIS — I214 Non-ST elevation (NSTEMI) myocardial infarction: Secondary | ICD-10-CM | POA: Diagnosis not present

## 2020-06-26 DIAGNOSIS — I10 Essential (primary) hypertension: Secondary | ICD-10-CM | POA: Diagnosis not present

## 2020-06-26 DIAGNOSIS — Z01810 Encounter for preprocedural cardiovascular examination: Secondary | ICD-10-CM | POA: Diagnosis not present

## 2020-06-26 DIAGNOSIS — A0472 Enterocolitis due to Clostridium difficile, not specified as recurrent: Secondary | ICD-10-CM | POA: Diagnosis not present

## 2020-06-26 DIAGNOSIS — Z992 Dependence on renal dialysis: Secondary | ICD-10-CM | POA: Diagnosis not present

## 2020-06-26 DIAGNOSIS — I255 Ischemic cardiomyopathy: Secondary | ICD-10-CM

## 2020-06-26 LAB — CBC WITH DIFFERENTIAL/PLATELET
Abs Immature Granulocytes: 0.11 10*3/uL — ABNORMAL HIGH (ref 0.00–0.07)
Basophils Absolute: 0 10*3/uL (ref 0.0–0.1)
Basophils Relative: 0 %
Eosinophils Absolute: 0.1 10*3/uL (ref 0.0–0.5)
Eosinophils Relative: 1 %
HCT: 25.6 % — ABNORMAL LOW (ref 36.0–46.0)
Hemoglobin: 8.8 g/dL — ABNORMAL LOW (ref 12.0–15.0)
Immature Granulocytes: 1 %
Lymphocytes Relative: 9 %
Lymphs Abs: 0.9 10*3/uL (ref 0.7–4.0)
MCH: 30.6 pg (ref 26.0–34.0)
MCHC: 34.4 g/dL (ref 30.0–36.0)
MCV: 88.9 fL (ref 80.0–100.0)
Monocytes Absolute: 0.9 10*3/uL (ref 0.1–1.0)
Monocytes Relative: 9 %
Neutro Abs: 8.4 10*3/uL — ABNORMAL HIGH (ref 1.7–7.7)
Neutrophils Relative %: 80 %
Platelets: 271 10*3/uL (ref 150–400)
RBC: 2.88 MIL/uL — ABNORMAL LOW (ref 3.87–5.11)
RDW: 18.4 % — ABNORMAL HIGH (ref 11.5–15.5)
WBC: 10.5 10*3/uL (ref 4.0–10.5)
nRBC: 0 % (ref 0.0–0.2)

## 2020-06-26 LAB — COMPREHENSIVE METABOLIC PANEL
ALT: 18 U/L (ref 0–44)
AST: 20 U/L (ref 15–41)
Albumin: 1.6 g/dL — ABNORMAL LOW (ref 3.5–5.0)
Alkaline Phosphatase: 91 U/L (ref 38–126)
Anion gap: 11 (ref 5–15)
BUN: 30 mg/dL — ABNORMAL HIGH (ref 8–23)
CO2: 21 mmol/L — ABNORMAL LOW (ref 22–32)
Calcium: 8.1 mg/dL — ABNORMAL LOW (ref 8.9–10.3)
Chloride: 97 mmol/L — ABNORMAL LOW (ref 98–111)
Creatinine, Ser: 4.02 mg/dL — ABNORMAL HIGH (ref 0.44–1.00)
GFR, Estimated: 11 mL/min — ABNORMAL LOW (ref >60)
Glucose, Bld: 125 mg/dL — ABNORMAL HIGH (ref 70–99)
Potassium: 4.1 mmol/L (ref 3.5–5.1)
Sodium: 129 mmol/L — ABNORMAL LOW (ref 135–145)
Total Bilirubin: 0.2 mg/dL — ABNORMAL LOW (ref 0.3–1.2)
Total Protein: 3.6 g/dL — ABNORMAL LOW (ref 6.5–8.1)

## 2020-06-26 LAB — BRAIN NATRIURETIC PEPTIDE: B Natriuretic Peptide: 1112 pg/mL — ABNORMAL HIGH (ref 0.0–100.0)

## 2020-06-26 LAB — MAGNESIUM: Magnesium: 1.8 mg/dL (ref 1.7–2.4)

## 2020-06-26 LAB — C-REACTIVE PROTEIN: CRP: 0.8 mg/dL (ref <1.0)

## 2020-06-26 LAB — D-DIMER, QUANTITATIVE: D-Dimer, Quant: 2.74 ug/mL-FEU — ABNORMAL HIGH (ref 0.00–0.50)

## 2020-06-26 MED ORDER — SODIUM CHLORIDE 0.9 % IV SOLN: 250.0000 mL | INTRAVENOUS | Status: DC | PRN

## 2020-06-26 MED ORDER — CLOPIDOGREL BISULFATE 75 MG PO TABS
75.0000 mg | ORAL_TABLET | Freq: Every day | ORAL | Status: DC
  Administered 2020-06-27 – 2020-07-04 (×8): 75 mg via ORAL
  Filled 2020-06-26 (×8): qty 1

## 2020-06-26 MED ORDER — SODIUM CHLORIDE 0.9% FLUSH
3.0000 mL | Freq: Two times a day (BID) | INTRAVENOUS | Status: DC
  Administered 2020-06-26 – 2020-06-29 (×4): 3 mL via INTRAVENOUS

## 2020-06-26 MED ORDER — ASPIRIN 81 MG PO CHEW
81.0000 mg | CHEWABLE_TABLET | ORAL | Status: AC
  Administered 2020-06-27: 81 mg via ORAL
  Filled 2020-06-26: qty 1

## 2020-06-26 MED ORDER — SODIUM CHLORIDE 0.9% FLUSH: 3.0000 mL | INTRAVENOUS | Status: DC | PRN

## 2020-06-26 MED ORDER — CLOPIDOGREL BISULFATE 300 MG PO TABS
300.0000 mg | ORAL_TABLET | Freq: Once | ORAL | Status: AC
  Administered 2020-06-26: 300 mg via ORAL
  Filled 2020-06-26: qty 1

## 2020-06-26 MED ORDER — DARBEPOETIN ALFA 100 MCG/0.5ML IJ SOSY
100.0000 ug | PREFILLED_SYRINGE | INTRAMUSCULAR | Status: DC
  Administered 2020-06-28: 100 ug via SUBCUTANEOUS
  Filled 2020-06-26: qty 0.5

## 2020-06-26 MED ORDER — SODIUM CHLORIDE 0.9 % IV SOLN: INTRAVENOUS | Status: DC

## 2020-06-26 MED ORDER — HEPARIN SODIUM (PORCINE) 5000 UNIT/ML IJ SOLN
5000.0000 [IU] | Freq: Three times a day (TID) | INTRAMUSCULAR | Status: DC
  Administered 2020-06-26 – 2020-06-28 (×6): 5000 [IU] via SUBCUTANEOUS
  Filled 2020-06-26 (×7): qty 1

## 2020-06-26 MED ORDER — BUTALBITAL-APAP-CAFFEINE 50-325-40 MG PO TABS
1.0000 | ORAL_TABLET | Freq: Four times a day (QID) | ORAL | Status: DC | PRN
  Administered 2020-06-26 – 2020-06-28 (×4): 1 via ORAL
  Filled 2020-06-26 (×5): qty 1

## 2020-06-26 NOTE — Progress Notes (Signed)
Physical Therapy Treatment Patient Details Name: Amy [name] MRN: [id number] DOB: 1950/11/08 Today's Date: 06/26/2020    History of Present Illness Amy [name] is a 70 y.o. female with medical history significant of ESRD on PD, HTN, multiple thyroid nodules. She had a hip fracture in December s/p ORIF. Recently hospitalized 1/25/-05/29/20 for watery diarrhea, hyponatremia, hypokalemia. She was found to be covid 19 positive but was not treated with no signs of respiratory involvement. had questionable seizure activity and was transfered to [facility] from [facility] s/p 06/25/20 Heart cath    PT Comments    Pt agreeable to working with therapy. Husband present at beginning of session and able to provide clearer picture of mobility prior to hospitalization, including short distance ambulation with RW. Pt limited in mobility today by generalized weakness and incontinence of bowels. Pt is min A for bed mobility, min A for transfers and min guard for ambulation from bed to recliner. Pt also able to perform static standing for ~3 min. BP continues to be low, however pt asymptomatic. Pt to receive Stint tomorrow in cath lab. Pt relieved to not be getting CABG. D/c plans remain appropriate. PT will continue to follow acutely.   Follow Up Recommendations  Home health PT;Supervision/Assistance - 24 hour     Equipment Recommendations  None recommended by PT       Precautions / Restrictions Precautions Precautions: Fall Precaution Comments: weakness, peritoneal diaylisis, swollen arms Restrictions Weight Bearing Restrictions: No    Mobility  Bed Mobility Overal bed mobility: Needs Assistance Bed Mobility: Supine to Sit Rolling: Supervision   Supine to sit: Min assist     General bed mobility comments: min A for pulling against therapist to elevate trunk, good weight shift to scoot hips to EoB    Transfers Overall transfer level: Needs assistance Equipment used: Rolling walker (2  wheeled) Transfers: Sit to/from [facility] Sit to Stand: Min assist;From elevated surface         General transfer comment: min A for power up to RW from elevated bed and lower recliner  Ambulation/Gait Ambulation/Gait assistance: Min guard Gait Distance (Feet): 4 Feet Assistive device: Rolling walker (2 wheeled) Gait Pattern/deviations: Step-to pattern;Trunk flexed;Shuffle;Decreased stance time - left Gait velocity: slowed Gait velocity interpretation: <1.31 ft/sec, indicative of household ambulator General Gait Details: min guard for safety decreased foot clearance on L side and decreased weight shift to R         Balance Overall balance assessment: Needs assistance Sitting-balance support: Feet supported;No upper extremity supported Sitting balance-Leahy Scale: Fair     Standing balance support: Single extremity supported;Bilateral upper extremity supported Standing balance-Leahy Scale: Poor Standing balance comment: able to static stand after incontinence of bowels for ~3 min while PT gathered washcloth, and linens, no dizziness or LoB noted                            Cognition Arousal/Alertness: Awake/alert Behavior During Therapy: WFL for tasks assessed/performed Overall Cognitive Status: Within Functional Limits for tasks assessed                                        Exercises General Exercises - Lower Extremity Hip Flexion/Marching: 10 reps;AROM;Standing    General Comments General comments (skin integrity, edema, etc.): supine BP 97/65 seated 77/55, after ambulation 99/66. pt with minor dizziness with first  coming to seated, but otherwise assymptomatic,      Pertinent Vitals/Pain Pain Assessment: No/denies pain           PT Goals (current goals can now be found in the care plan section) Acute Rehab PT Goals Patient Stated Goal: home PT Goal Formulation: With patient/family Time For Goal Achievement:  07/05/20 Potential to Achieve Goals: Good Progress towards PT goals: Progressing toward goals    Frequency    Min 3X/week      PT Plan Current plan remains appropriate       AM-PAC PT "6 Clicks" Mobility   Outcome Measure  Help needed turning from your back to your side while in a flat bed without using bedrails?: A Little Help needed moving from lying on your back to sitting on the side of a flat bed without using bedrails?: A Little Help needed moving to and from a bed to a chair (including a wheelchair)?: A Little Help needed standing up from a chair using your arms (e.g., wheelchair or bedside chair)?: A Little Help needed to walk in hospital room?: A Little Help needed climbing 3-5 steps with a railing? : A Lot 6 Click Score: 17    End of Session Equipment Utilized During Treatment: Gait belt Activity Tolerance: Treatment limited secondary to medical complications (Comment);Patient tolerated treatment well Patient left: with call bell/phone within reach;in chair Nurse Communication: Mobility status (decreased BP) PT Visit Diagnosis: Muscle weakness (generalized) (M62.81)     Time: 8295-6213 PT Time Calculation (min) (ACUTE ONLY): 37 min  Charges:  $Gait Training: 8-22 mins $Therapeutic Exercise: 8-22 mins                     Umaiza Matusik B. Migdalia Dk PT, DPT Acute Rehabilitation Services Pager [phone] Office [phone]    [place] 06/26/2020, 4:20 PM

## 2020-06-26 NOTE — Consult Note (Addendum)
Advanced Heart Failure Team Consult Note   Primary Physician: [name], MD PCP-Cardiologist:  Ida Rogue, MD  Reason for Consultation: New Systolic Heart Failure 2/2 Ischemic CM   HPI:    Amy [name] is seen today for evaluation of new systolic heart failure 2/2 ICM at the request of Dr. Orvan Seen, [place] [facility].   70 y/o female w/ ESRD on PD and HTN. Has prior h/o atrial flutter but has not been on a/c given h/o anemia. No other cardiac history. [facility] and broker her hip 12/21 and underwent ORIF w/o complication. Hospitalized again 1/22 for diarrhea and tested + for C-diff. Also found to be COVID +.   Readmitted again 06/23/20 for seizure like activity but EEG unremarkable.  Felt most likely 2/2 hyponatremia/hypkalemia from GI loses. She was incidentally found to have elevated Hs Trop 1,978>>1,554 and elevated d-dimer. Chest CT negative for PE. Echo showed severely reduced LVEF 25-30%. RV mildly reduced. Underwent subsequent LHC showing severe multivessel CAD with diffuse 80%, 95% and 80% proximal LAD stenosis, 90% diagonal stenosis;  85% focal ramus intermediate stenosis;  and 50% stenoses in the AV groove circumflex. The right coronary artery is a dominant normal vessel.  She is being evaluated for potential CABG. AHF team consulted for preoperative management and optimization of heart failure.   She has had no anginal symptoms, denying exertional CP. No dyspnea.   Since undergoing cath, she has had decreased UOP. Does PD daily.   LHC 06/25/20   Mid Cx lesion is 50% stenosed.  Dist Cx lesion is 50% stenosed.  Ramus lesion is 85% stenosed.  Prox LAD to Mid LAD lesion is 85% stenosed.  Mid LAD-1 lesion is 95% stenosed.  Mid LAD-2 lesion is 80% stenosed.  1st Diag lesion is 90% stenosed.   Severe multivessel CAD with diffuse 80%, 95% and 80% proximal LAD stenosis, 90% diagonal stenosis;  85% focal ramus intermediate stenosis;  and 50% stenoses in the AV groove  circumflex.  The right coronary artery is a dominant normal vessel.  LVEDP 8 mmHg  2D echo 06/23/20  1. LV function is severerly depressed The very proximal portion of LV is vigorous. There is akinesis of the distal lateral, mid/distal septal, mid/dital inferior, distal anterior and apical walls are akinetic. Compared to echo report from Dec, 2020 wall motion abnormalities and depressed LVEF are new.. Left ventricular ejection fraction, by estimation, is 25 to 30%. The left ventricle has severely decreased function. Left ventricular diastolic parameters are indeterminate. 2. Akinesis of the apex.. Right ventricular systolic function is mildly reduced. The right ventricular size is normal. There is normal pulmonary artery systolic pressure. 3. The mitral valve is abnormal. Mild mitral valve regurgitation. 4. The aortic valve is abnormal. Aortic valve regurgitation is not visualized. Mild aortic valve sclerosis is present, with no evidence of aortic valve stenosis.  Review of Systems: [y] = yes, [ ]  = no   . General: Weight gain [ ] ; Weight loss [ ] ; Anorexia [ ] ; Fatigue [ Y; Fever [ ] ; Chills [ ] ; Weakness [ ]   . Cardiac: Chest pain/pressure [ ] ; Resting SOB [ ] ; Exertional SOB [ ] ; Orthopnea [ ] ; Pedal Edema [ ] ; Palpitations [ ] ; Syncope [ ] ; Presyncope [ ] ; Paroxysmal nocturnal dyspnea[ ]   . Pulmonary: Cough [ ] ; Wheezing[ ] ; Hemoptysis[ ] ; Sputum [ ] ; Snoring [ ]   . GI: Vomiting[ ] ; Dysphagia[ ] ; Melena[ ] ; Hematochezia [ ] ; Heartburn[ ] ; Abdominal pain [ ] ; Constipation [ ] ;  Diarrhea [ ] ; BRBPR [ ]   . GU: Hematuria[ ] ; Dysuria [ ] ; Nocturia[ ]   . Vascular: Pain in legs with walking [ ] ; Pain in feet with lying flat [ ] ; Non-healing sores [ ] ; Stroke [ ] ; TIA [ ] ; Slurred speech [ ] ;  . Neuro: Headaches[ ] ; Vertigo[ ] ; Seizures[ ] ; Paresthesias[ ] ;Blurred vision [ ] ; Diplopia [ ] ; Vision changes [ ]   . Ortho/Skin: Arthritis [ ] ; Joint pain [ ] ; Muscle pain [ ] ; Joint swelling [ ] ;  Back Pain [ ] ; Rash [ ]   . Psych: Depression[ ] ; Anxiety[ ]   . Heme: Bleeding problems [ ] ; Clotting disorders [ ] ; Anemia [ ]   . Endocrine: Diabetes [ ] ; Thyroid dysfunction[ ]   Home Medications Prior to Admission medications   Medication Sig Start Date End Date Taking? Authorizing Provider  buPROPion (WELLBUTRIN) 75 MG tablet TAKE 1 TABLET(75 MG) BY MOUTH TWICE DAILY Patient taking differently: Take 75 mg by mouth 2 (two) times daily. 02/11/20  Yes [name], MD  butalbital-acetaminophen-caffeine (FIORICET) 50-325-40 MG tablet TAKE 1 TO 2 TABLETS BY MOUTH EVERY 4 HOURS AS NEEDED FOR HEADACHE Patient taking differently: Take 2 tablets by mouth every 6 (six) hours as needed for headache. 05/25/20  Yes [name], MD  calcitRIOL (ROCALTROL) 0.5 MCG capsule Take 0.5 mcg by mouth daily.   Yes [provider]  cinacalcet (SENSIPAR) 30 MG tablet Take 30 mg by mouth daily.   Yes [provider]  cloNIDine (CATAPRES) 0.2 MG tablet Take 1 tablet (0.2 mg total) by mouth 2 (two) times daily. Patient taking differently: Take 0.2 mg by mouth 3 (three) times daily. 12/17/19 01/16/20 Yes Max Sane, MD  esomeprazole (NEXIUM) 20 MG capsule Take 20 mg by mouth daily. 02/20/19  Yes [provider]  hydrALAZINE (APRESOLINE) 50 MG tablet Take 1 tablet (50 mg total) by mouth 3 (three) times daily as needed. For SBP > 160 mm hg 12/17/19 01/16/20 Yes Shah, Vipul, MD  LORazepam (ATIVAN) 1 MG tablet TAKE 1 TABLET(1 MG) BY MOUTH TWICE DAILY AS NEEDED Patient taking differently: Take 1 mg by mouth 2 (two) times daily as needed for anxiety or sleep. 03/17/20  Yes [name], MD  losartan (COZAAR) 100 MG tablet Take 100 mg by mouth at bedtime. 02/20/19  Yes [provider]  metoprolol tartrate (LOPRESSOR) 50 MG tablet Take 75 mg by mouth 2 (two) times daily.   Yes [provider]  sennosides-docusate sodium (SENOKOT-S) 8.6-50 MG tablet Take 2 tablets by mouth 2  (two) times daily as needed for constipation. 04/26/20  Yes Max Sane, MD  sodium bicarbonate 650 MG tablet Take 1 tablet (650 mg total) by mouth 2 (two) times daily. 05/31/20  Yes Sharen Hones, MD  torsemide (DEMADEX) 100 MG tablet Take 100 mg by mouth 2 (two) times daily.   Yes [provider]  valACYclovir (VALTREX) 1000 MG tablet Take 1,000 mg by mouth 2 (two) times daily as needed (cold sores). 09/11/19  Yes [provider]  zolpidem (AMBIEN) 10 MG tablet TAKE 1/2 TO 1 TABLET(5 TO 10 MG) BY MOUTH AT BEDTIME AS NEEDED Patient taking differently: Take 10 mg by mouth at bedtime. 05/16/20  Yes [name], MD    Past Medical History: Past Medical History:  Diagnosis Date  . Anemia    a. 08/2016 Guaiac + stool. EGD w/ gastritis.  . Anxiety   . CKD (chronic kidney disease), stage III ([place])   . Closed  fracture of shaft of left ulna 11/27/2018  . Colon polyp    a. 08/2016 Colonoscopy.  . Gastritis    a. 08/2016 EGD: Gastritis-->PPI.  [name] GERD (gastroesophageal reflux disease)   . History of chicken pox   . History of measles as a child   . Hypertension   . Hyponatremia    a. 08/2016 in setting of HCTZ Rx.  . Mesenteric lymphadenopathy 04/08/2017  . Multiple thyroid nodules     Past Surgical History: Past Surgical History:  Procedure Laterality Date  . ABDOMINAL HYSTERECTOMY  2003   due to heavy periods and clotting during menses  . BREAST LUMPECTOMY Left 1990  . CHOLECYSTECTOMY  2010  . COLONOSCOPY WITH PROPOFOL N/A 09/07/2016   Procedure: COLONOSCOPY WITH PROPOFOL;  Surgeon: Lucilla Lame, MD;  Location[facility] ENDOSCOPY;  Service: Endoscopy;  Laterality: N/A;  . DIALYSIS/PERMA CATHETER INSERTION N/A 11/16/2018   Procedure: DIALYSIS/PERMA CATHETER INSERTION;  Surgeon: Algernon Huxley, MD;  Location: [place] CV LAB;  Service: Cardiovascular;  Laterality: N/A;  . DIALYSIS/PERMA CATHETER REMOVAL N/A 08/16/2019   Procedure: DIALYSIS/PERMA CATHETER REMOVAL;  Surgeon: Algernon Huxley, MD;  Location: [place] CV LAB;  Service: Cardiovascular;  Laterality: N/A;  . DILATION AND CURETTAGE OF UTERUS  1985  . ESOPHAGOGASTRODUODENOSCOPY (EGD) WITH PROPOFOL N/A 09/07/2016   Procedure: ESOPHAGOGASTRODUODENOSCOPY (EGD) WITH PROPOFOL;  Surgeon: Lucilla Lame, MD;  Location: [facility] ENDOSCOPY;  Service: Endoscopy;  Laterality: N/A;  . GALLBLADDER SURGERY  2012  . INTRAMEDULLARY (IM) NAIL INTERTROCHANTERIC Right 04/23/2020   Procedure: INTRAMEDULLARY (IM) NAIL INTERTROCHANTRIC;  Surgeon: Thornton Park, MD;  Location: [facility] ORS;  Service: Orthopedics;  Laterality: Right;  . KNEE SURGERY     Dr. Tamala Julian  . LEFT HEART CATH AND CORONARY ANGIOGRAPHY N/A 06/25/2020   Procedure: LEFT HEART CATH AND CORONARY ANGIOGRAPHY;  Surgeon: Troy Sine, MD;  Location: [place] CV LAB;  Service: Cardiovascular;  Laterality: N/A;  . TONSILLECTOMY    . TONSILLECTOMY AND ADENOIDECTOMY  1970  . UPPER GI ENDOSCOPY  03/02/2006   H. Pylori negative; Dr. Allen Norris    Family History: Family History  Problem Relation Age of Onset  . Pancreatic cancer Father   . Congestive Heart Failure Maternal Grandmother   . Cancer Paternal Grandfather   . Heart disease Maternal Uncle     Social History: Social History   Socioeconomic History  . Marital status: Married    Spouse name: Not on file  . Number of children: 1  . Years of education: Not on file  . Highest education level: Bachelor's degree (e.g., BA, AB, BS)  Occupational History  . Occupation: Retired  Tobacco Use  . Smoking status: Never Smoker  . Smokeless tobacco: Never Used  Vaping Use  . Vaping Use: Never used  Substance and Sexual Activity  . Alcohol use: Yes    Alcohol/week: 0.0 - 2.0 standard drinks  . Drug use: No  . Sexual activity: Yes  Other Topics Concern  . Not on file  Social History Narrative   Lives in [place] with husband.  Does not routinely exercise.   Social Determinants of Health   Financial Resource  Strain: Low Risk   . Difficulty of Paying Living Expenses: Not hard at all  Food Insecurity: No Food Insecurity  . Worried About Charity fundraiser in the Last Year: Never true  . Ran Out of Food in the Last Year: Never true  Transportation Needs: No Transportation Needs  . Lack of Transportation (  Medical): No  . Lack of Transportation (Non-Medical): No  Physical Activity: Inactive  . Days of Exercise per Week: 0 days  . Minutes of Exercise per Session: 0 min  Stress: No Stress Concern Present  . Feeling of Stress : Not at all  Social Connections: Moderately Integrated  . Frequency of Communication with Friends and Family: More than three times a week  . Frequency of Social Gatherings with Friends and Family: Three times a week  . Attends Religious Services: More than 4 times per year  . Active Member of Clubs or Organizations: No  . Attends Archivist Meetings: Never  . Marital Status: Married    Allergies:  Allergies  Allergen Reactions  . Cephalosporins Hives and Swelling  . Clarithromycin Other (See Comments)    Unknown reaction  . Lunesta  [Eszopiclone]     Heart racing  . Penicillin V     Has patient had a PCN reaction causing immediate rash, facial/tongue/throat swelling, SOB or lightheadedness with hypotension: Yes Has patient had a PCN reaction causing severe rash involving mucus membranes or skin necrosis: No Has patient had a PCN reaction that required hospitalization No Has patient had a PCN reaction occurring within the last 10 years: No If all of the above answers are "NO", then may proceed with Cephalosporin use.  . Sulfa Antibiotics     Unknown reaction    Objective:    Vital Signs:   Temp:  [97.9 F (36.6 C)-99.1 F (37.3 C)] 98.3 F (36.8 C) (02/24 1010) Pulse Rate:  [104-120] 108 (02/24 0800) Resp:  [14-21] 18 (02/24 1010) BP: (94-118)/(64-83) 117/82 (02/24 1010) SpO2:  [96 %-99 %] 99 % (02/24 0800) Weight:  [51.9 kg] 51.9 kg (02/24  0528) Last BM Date: 06/25/20  Weight change: Filed Weights   06/25/20 0500 06/25/20 0650 06/26/20 0528  Weight: 51.1 kg 51.1 kg 51.9 kg    Intake/Output:   Intake/Output Summary (Last 24 hours) at 06/26/2020 1155 Last data filed at 06/26/2020 0542 Gross per 24 hour  Intake 1481.41 ml  Output 12 ml  Net 1469.41 ml      Physical Exam    General:  Well appearing. No resp difficulty HEENT: normal Neck: supple. JVP . Carotids 2+ bilat; no bruits. No lymphadenopathy or thyromegaly appreciated. Cor: PMI nondisplaced. Regular rate & rhythm. No rubs, gallops or murmurs. Lungs: clear Abdomen: soft, nontender, nondistended. No hepatosplenomegaly. No bruits or masses. Good bowel sounds. Extremities: no cyanosis, clubbing, rash, 1+ bilateral LE edema Neuro: alert & orientedx3, cranial nerves grossly intact. moves all 4 extremities w/o difficulty. Affect pleasant   Telemetry   NSR 90s   EKG    Sinus tachycardia 123 bpm   Labs   Basic Metabolic Panel: Recent Labs  Lab 06/22/20 0139 06/22/20 2216 06/23/20 0057 06/24/20 0102 06/25/20 0104 06/25/20 1123 06/26/20 0107  NA 126*  --  130* 129* 131*  --  129*  K 4.6  --  4.0 3.2* 3.5  --  4.1  CL 96*  --  100 97* 98  --  97*  CO2 19*  --  18* 22 23  --  21*  GLUCOSE 90  --  193* 106* 110*  --  125*  BUN 38*  --  36* 28* 27*  --  30*  CREATININE 5.26*  --  5.04* 4.19* 4.02* 4.05* 4.02*  CALCIUM 7.8*  --  8.0* 7.9* 7.9*  --  8.1*  MG 2.0 1.9 1.9 1.7 1.5*  --  1.8    Liver Function Tests: Recent Labs  Lab 06/22/20 0139 06/23/20 0057 06/24/20 0102 06/25/20 0104 06/26/20 0107  AST 29 33 25 22 20   ALT 20 23 20 20 18   ALKPHOS 82 90 90 80 91  BILITOT 0.5 0.4 0.4 0.5 0.2*  PROT 3.5* 4.1* 3.9* 3.4* 3.6*  ALBUMIN 1.7* 1.9* 1.7* 1.4* 1.6*   Recent Labs  Lab 06/20/20 0313  LIPASE 95*   No results for input(s): AMMONIA in the last 168 hours.  CBC: Recent Labs  Lab 06/22/20 0139 06/22/20 0704 06/23/20 0057  06/24/20 0102 06/25/20 0104 06/25/20 1123 06/26/20 0107  WBC 10.9*   < > 9.3 9.1 7.8 10.0 10.5  NEUTROABS 8.6*  --  7.8* 6.7 5.6  --  8.4*  HGB 6.7*   < > 9.9* 10.2* 8.7* 9.7* 8.8*  HCT 19.1*   < > 27.9* 29.8* 24.0* 26.5* 25.6*  MCV 87.2   < > 86.6 87.6 86.3 85.8 88.9  PLT 345   < > 263 278 216 271 271   < > = values in this interval not displayed.    Cardiac Enzymes: No results for input(s): CKTOTAL, CKMB, CKMBINDEX, TROPONINI in the last 168 hours.  BNP: BNP (last 3 results) Recent Labs    06/24/20 0102 06/25/20 0104 06/26/20 0107  BNP 1,625.0* 1,416.4* 1,112.0*    ProBNP (last 3 results) No results for input(s): PROBNP in the last 8760 hours.   CBG: Recent Labs  Lab 06/19/20 1609  GLUCAP 140*    Coagulation Studies: No results for input(s): LABPROT, INR in the last 72 hours.   Imaging   VAS US DOPPLER PRE CABG  Result Date: 06/26/2020 PREOPERATIVE VASCULAR EVALUATION  Indications:      Pre-CABG. Risk Factors:     Hypertension, hyperlipidemia, coronary artery disease. Comparison Study: No prior study Performing Technologist: Sharion Dove RVS  Examination Guidelines: A complete evaluation includes B-mode imaging, spectral Doppler, color Doppler, and power Doppler as needed of all accessible portions of each vessel. Bilateral testing is considered an integral part of a complete examination. Limited examinations for reoccurring indications may be performed as noted.  Right Carotid Findings: +----------+--------+--------+--------+------------+------------------+           PSV cm/sEDV cm/sStenosisDescribe    Comments           +----------+--------+--------+--------+------------+------------------+ CCA Prox  79      25                          intimal thickening +----------+--------+--------+--------+------------+------------------+ CCA Distal62      25                          intimal thickening  +----------+--------+--------+--------+------------+------------------+ ICA Prox  63      17              heterogenous                   +----------+--------+--------+--------+------------+------------------+ ICA Distal77      31                                             +----------+--------+--------+--------+------------+------------------+ ECA       48      11                                             +----------+--------+--------+--------+------------+------------------+  Portions of this table do not appear on this page. +----------+--------+-------+--------+------------+           PSV cm/sEDV cmsDescribeArm Pressure +----------+--------+-------+--------+------------+ Subclavian59                                  +----------+--------+-------+--------+------------+ +---------+--------+--+--------+--+ VertebralPSV cm/s70EDV cm/s22 +---------+--------+--+--------+--+ Left Carotid Findings: +----------+--------+--------+--------+------------+------------------+           PSV cm/sEDV cm/sStenosisDescribe    Comments           +----------+--------+--------+--------+------------+------------------+ CCA Prox  72      20                          intimal thickening +----------+--------+--------+--------+------------+------------------+ CCA Distal53      18                          intimal thickening +----------+--------+--------+--------+------------+------------------+ ICA Prox  61      30              heterogenous                   +----------+--------+--------+--------+------------+------------------+ ICA Distal83      34                                             +----------+--------+--------+--------+------------+------------------+ ECA       46      10                                             +----------+--------+--------+--------+------------+------------------+ +----------+--------+--------+--------+------------+  SubclavianPSV cm/sEDV cm/sDescribeArm Pressure +----------+--------+--------+--------+------------+           83                                   +----------+--------+--------+--------+------------+ +---------+--------+--+--------+--+ VertebralPSV cm/s48EDV cm/s15 +---------+--------+--+--------+--+  ABI Findings: +--------+------------------+-----+-----------+--------+ Right   Rt Pressure (mmHg)IndexWaveform   Comment  +--------+------------------+-----+-----------+--------+ [id number]                    triphasic           +--------+------------------+-----+-----------+--------+ PTA                            multiphasic         +--------+------------------+-----+-----------+--------+ DP                             multiphasic         +--------+------------------+-----+-----------+--------+ +--------+------------------+-----+-----------+-------+ Left    Lt Pressure (mmHg)IndexWaveform   Comment +--------+------------------+-----+-----------+-------+ [id number]                    triphasic          +--------+------------------+-----+-----------+-------+ PTA                            multiphasic        +--------+------------------+-----+-----------+-------+ DP  multiphasic        +--------+------------------+-----+-----------+-------+  Right Doppler Findings: +-----------+--------+-----+-----------+---------------------------------------+ Site       PressureIndexDoppler    Comments                                +-----------+--------+-----+-----------+---------------------------------------+ Brachial   117          triphasic                                          +-----------+--------+-----+-----------+---------------------------------------+ Radial                  multiphasic                                        +-----------+--------+-----+-----------+---------------------------------------+  Ulnar                   multiphasic                                        +-----------+--------+-----+-----------+---------------------------------------+ Palmar Arch                        Doppler signal reverses with both                                          radial and ulnar compression            +-----------+--------+-----+-----------+---------------------------------------+  Left Doppler Findings: +--------+--------+-----+-----------+--------+ Site    PressureIndexDoppler    Comments +--------+--------+-----+-----------+--------+ [id number]          triphasic           +--------+--------+-----+-----------+--------+ Radial               multiphasic         +--------+--------+-----+-----------+--------+ Ulnar                multiphasic         +--------+--------+-----+-----------+--------+  Summary: Right Carotid: The extracranial vessels were near-normal with only minimal wall                thickening or plaque. Left Carotid: The extracranial vessels were near-normal with only minimal wall               thickening or plaque. Vertebrals:  Bilateral vertebral arteries demonstrate antegrade flow. Subclavians: Normal flow hemodynamics were seen in bilateral subclavian              arteries. Right Upper Extremity: Doppler waveforms remain within normal limits with right radial compression. Doppler waveforms remain within normal limits with right ulnar compression. Left Upper Extremity: Doppler waveform obliterate with left radial compression. Doppler waveforms remain within normal limits with left ulnar compression.     Preliminary       Medications:     Current Medications: . sodium chloride   Intravenous Once  . aspirin  81 mg Oral Daily  . atorvastatin  80 mg Oral q1800  . buPROPion  75 mg Oral BID  . calcitRIOL  0.5 mcg Oral Daily  .  Chlorhexidine Gluconate Cloth  6 each Topical Daily  . cinacalcet  30 mg Oral Q breakfast  . [START ON 06/28/2020]  darbepoetin (ARANESP) injection - NON-DIALYSIS  100 mcg Subcutaneous Q Sat-1800  . feeding supplement (NEPRO CARB STEADY)  237 mL Oral BID BM  . fidaxomicin  200 mg Oral BID  . gentamicin cream  1 application Topical Daily  . heparin  5,000 Units Subcutaneous Q8H  . metoprolol tartrate  25 mg Oral BID  . multivitamin  1 tablet Oral QHS  . pantoprazole  40 mg Oral Daily  . sodium chloride flush  3 mL Intravenous Once  . sodium chloride flush  3 mL Intravenous Q12H  . sodium chloride flush  3 mL Intravenous Q12H     Infusions: . sodium chloride    . levETIRAcetam 500 mg (06/26/20 0516)      Assessment/Plan   1. CAD/ NSTEMI - Hs Trop 1,978>>1,554 - Echo EF 25-30%. RV mildly reduced  - Cath w/ multivessel CAD but no recent anginal symptoms - Being evaluated for potential CABG. Ideally, would do cMRI for viability study but too high risk for NSF w/ ESRD. May be better suited for PCI. Will d/w CT surgery and interventional team  - Continue  ASA, statin and  blocker   2. Acute Systolic Heart Failure - New diagnosis, ICM w/ severe MVCAD - EF 25-30%, RV mildly reduced   - ESRD on PD, continue daily for volume control. Nephrology following  - Medical therapy limited by CKD and soft BP  - may not be suitable candidate for CABG. Considering PCI     3. ESRD - on PD - nephrology following   4. ? Seizures - EEG unremarkable  - felt most likely 2/2 Hyponatremia/hypkalemia from GI loses   5. Hyponatremia - Na 129  - management per nephrology   6. Hypokalemia - resolved K 4.1 today   Length of Stay: [address], PA-C  06/26/2020, 11:55 AM  Advanced Heart Failure Team Pager [phone] (M-F; 7a - 4p)  Please contact [facility] for night-coverage after hours (4p -7a ) and weekends on [email or web address]  Patient seen with PA, agree with the above note.   She has had a complicated recent history with hip fracture in 12/21, then severe C difficile diarrhea along with  COVID-19 in 1/22.  She is significantly deconditioned, only doing a little walking with her walker per her husband.  She was admitted with seizure activity, thought to be hyponatremia in setting of GI losses.  Incidentally noted to have elevated troponin, then echo done showing EF 25-30%.  Cath done, showing severe diffuse proximal-mid LAD disease and 85% proximal ramus disease.   Patient has ESRD, does PD.   General: NAD Neck: JVP 8 cm, no thyromegaly or thyroid nodule.  Lungs: Clear to auscultation bilaterally with normal respiratory effort. CV: Nondisplaced PMI.  Heart regular S1/S2, no S3/S4, no murmur.  1+ edema to knees.  No carotid bruit.  Normal pedal pulses.  Abdomen: Soft, nontender, no hepatosplenomegaly, no distention.  Skin: Intact without lesions or rashes.  Neurologic: Alert and oriented x 3.  Psych: Normal affect. Extremities: No clubbing or cyanosis.  HEENT: Normal.   The question at this point is CABG versus PCI.  We are unable to do an MRI viability study due to ESRD on PD (possibly feasible for HD but not easily accomplishable for PD).  I think that she is too deconditioned at this point to recover from CABG and  think that her best option is going to be PCI to LAD, Dr. Orvan Seen is in agreement.  I discussed films with Dr. Burt Knack, will plan for tomorrow.  Loralie Champagne 06/26/2020 4:18 PM

## 2020-06-26 NOTE — Progress Notes (Signed)
Progress Note  Patient Name: Amy [name] Date of Encounter: 06/26/2020  [facility] Cardiologist: Ida Rogue, MD   Subjective   Feeling tired.  No chest pain or shortness of breath.  Nervous about the idea of having another major surgery when she is still recovering from her hip.  Inpatient Medications    Scheduled Meds: . sodium chloride   Intravenous Once  . aspirin  81 mg Oral Daily  . atorvastatin  80 mg Oral q1800  . buPROPion  75 mg Oral BID  . calcitRIOL  0.5 mcg Oral Daily  . Chlorhexidine Gluconate Cloth  6 each Topical Daily  . cinacalcet  30 mg Oral Q breakfast  . [START ON 06/28/2020] darbepoetin (ARANESP) injection - NON-DIALYSIS  100 mcg Subcutaneous Q Sat-1800  . feeding supplement (NEPRO CARB STEADY)  237 mL Oral BID BM  . fidaxomicin  200 mg Oral BID  . gentamicin cream  1 application Topical Daily  . heparin  5,000 Units Subcutaneous Q8H  . metoprolol tartrate  25 mg Oral BID  . multivitamin  1 tablet Oral QHS  . pantoprazole  40 mg Oral Daily  . sodium chloride flush  3 mL Intravenous Once  . sodium chloride flush  3 mL Intravenous Q12H  . sodium chloride flush  3 mL Intravenous Q12H   Continuous Infusions: . sodium chloride    . levETIRAcetam 500 mg (06/26/20 0516)   PRN Meds: sodium chloride, acetaminophen, diazepam, Gerhardt's butt cream, dianeal solution for CAPD/[name] with heparin, ondansetron (ZOFRAN) IV, sodium chloride flush, traZODone   Vital Signs    Vitals:   06/25/20 2318 06/26/20 0321 06/26/20 0528 06/26/20 0800  BP: 109/79 98/64  110/68  Pulse: (!) 105 (!) 111  (!) 108  Resp: (!) 21 17  14   Temp: 99.1 F (37.3 C) 98.5 F (36.9 C)  97.9 F (36.6 C)  TempSrc: Oral Oral  Oral  SpO2: 97% 97%  99%  Weight:   51.9 kg   Height:   5\' 4"  (1.626 m)     Intake/Output Summary (Last 24 hours) at 06/26/2020 0926 Last data filed at 06/26/2020 0542 Gross per 24 hour  Intake 1481.41 ml  Output 12 ml  Net 1469.41 ml   Last 3  Weights 06/26/2020 06/25/2020 06/25/2020  Weight (lbs) 114 lb 6.7 oz 112 lb 10.5 oz 112 lb 10.5 oz  Weight (kg) 51.9 kg 51.1 kg 51.1 kg      Telemetry    Sinus rhythm, sinus tachycardia.   Personally Reviewed  ECG    N/A- Personally Reviewed  Physical Exam   VS:  BP 110/68 (BP Location: Right Arm)   Pulse (!) 108   Temp 97.9 F (36.6 C) (Oral)   Resp 14   Ht 5\' 4"  (1.626 m)   Wt 51.9 kg   SpO2 99%   BMI 19.64 kg/m  , BMI Body mass index is 19.64 kg/m. GENERAL: Chronically ill-appearing.  No acute distress. HEENT: Pupils equal round and reactive, fundi not visualized, oral mucosa unremarkable.  Cushingoid face NECK:  No jugular venous distention, waveform within normal limits, carotid upstroke brisk and symmetric LUNGS:  Clear to auscultation bilaterally HEART:  RRR.  PMI not displaced or sustained,S1 and S2 within normal limits, no S3, no S4, no clicks, no rubs, no murmurs ABD:  Flat, positive bowel sounds normal in frequency in pitch, no bruits, no rebound, no guarding, no midline pulsatile mass, no hepatomegaly, no splenomegaly EXT:  2 plus pulses throughout, +  L UE edema.  No LE edema, no cyanosis no clubbing SKIN:  No rashes no nodules NEURO:  Cranial nerves II through XII grossly intact, motor grossly intact throughout [facility]:  Cognitively intact, oriented to person place and time   Labs    High Sensitivity Troponin:   Recent Labs  Lab 05/29/20 1215 06/23/20 0454 06/23/20 0652 06/23/20 0930  TROPONINIHS 25* 1,978* 1,554* 1,397*      Chemistry Recent Labs  Lab 06/24/20 0102 06/25/20 0104 06/25/20 1123 06/26/20 0107  NA 129* 131*  --  129*  K 3.2* 3.5  --  4.1  CL 97* 98  --  97*  CO2 22 23  --  21*  GLUCOSE 106* 110*  --  125*  BUN 28* 27*  --  30*  CREATININE 4.19* 4.02* 4.05* 4.02*  CALCIUM 7.9* 7.9*  --  8.1*  PROT 3.9* 3.4*  --  3.6*  ALBUMIN 1.7* 1.4*  --  1.6*  AST 25 22  --  20  ALT 20 20  --  18  ALKPHOS 90 80  --  91  BILITOT 0.4 0.5  --   0.2*  GFRNONAA 11* 11* 11* 11*  ANIONGAP 10 10  --  11     Hematology Recent Labs  Lab 06/25/20 0104 06/25/20 1123 06/26/20 0107  WBC 7.8 10.0 10.5  RBC 2.78* 3.09* 2.88*  HGB 8.7* 9.7* 8.8*  HCT 24.0* 26.5* 25.6*  MCV 86.3 85.8 88.9  MCH 31.3 31.4 30.6  MCHC 36.3* 36.6* 34.4  RDW 18.2* 18.2* 18.4*  PLT 216 271 271    BNP Recent Labs  Lab 06/24/20 0102 06/25/20 0104 06/26/20 0107  BNP 1,625.0* 1,416.4* 1,112.0*     DDimer  Recent Labs  Lab 06/24/20 0102 06/25/20 0104 06/26/20 0107  DDIMER 1.95* 1.87* 2.74*     Radiology    CARDIAC CATHETERIZATION  Result Date: 06/25/2020  Mid Cx lesion is 50% stenosed.  Dist Cx lesion is 50% stenosed.  Ramus lesion is 85% stenosed.  Prox LAD to Mid LAD lesion is 85% stenosed.  Mid LAD-1 lesion is 95% stenosed.  Mid LAD-2 lesion is 80% stenosed.  1st Diag lesion is 90% stenosed.  Severe multivessel CAD with diffuse 80%, 95% and 80% proximal LAD stenosis, 90% diagonal stenosis;  85% focal ramus intermediate stenosis;  and 50% stenoses in the AV groove circumflex. The right coronary artery is a dominant normal vessel. LVEDP 8 mmHg RECOMMENDATION: With severe LV dysfunction with EF of 25% and diffuse artery disease recommend initial surgical consultation consideration of CABG revascularization.  If we will to perform surgery, then will need an interventional approach to the LAD and ramus.  Will review with colleagues.   VAS [place] LOWER EXTREMITY VENOUS (DVT)  Result Date: 06/24/2020  Lower Venous DVT Study Other Indications: D-DImer. Risk Factors: None identified. Comparison Study: 5/18 Negative Performing Technologist: Vonzell Schlatter RVT  Examination Guidelines: A complete evaluation includes B-mode imaging, spectral Doppler, color Doppler, and power Doppler as needed of all accessible portions of each vessel. Bilateral testing is considered an integral part of a complete examination. Limited examinations for reoccurring indications may  be performed as noted. The reflux portion of the exam is performed with the patient in reverse Trendelenburg.  +---------+---------------+---------+-----------+----------+--------------+ RIGHT    CompressibilityPhasicitySpontaneityPropertiesThrombus Aging +---------+---------------+---------+-----------+----------+--------------+ CFV      Full           Yes      Yes                                 +---------+---------------+---------+-----------+----------+--------------+  SFJ      Full                                                        +---------+---------------+---------+-----------+----------+--------------+ FV Prox  Full                                                        +---------+---------------+---------+-----------+----------+--------------+ FV Mid   Full                                                        +---------+---------------+---------+-----------+----------+--------------+ FV DistalFull                                                        +---------+---------------+---------+-----------+----------+--------------+ PFV      Full                                                        +---------+---------------+---------+-----------+----------+--------------+ POP      Full           Yes      Yes                                 +---------+---------------+---------+-----------+----------+--------------+ PTV      Full                                                        +---------+---------------+---------+-----------+----------+--------------+ PERO     Full                                                        +---------+---------------+---------+-----------+----------+--------------+   +---------+---------------+---------+-----------+----------+--------------+ LEFT     CompressibilityPhasicitySpontaneityPropertiesThrombus Aging +---------+---------------+---------+-----------+----------+--------------+ CFV       Full           Yes      Yes                                 +---------+---------------+---------+-----------+----------+--------------+ SFJ      Full                                                        +---------+---------------+---------+-----------+----------+--------------+  FV Prox  Full                                                        +---------+---------------+---------+-----------+----------+--------------+ FV Mid   Full                                                        +---------+---------------+---------+-----------+----------+--------------+ FV DistalFull                                                        +---------+---------------+---------+-----------+----------+--------------+ PFV      Full                                                        +---------+---------------+---------+-----------+----------+--------------+ POP      Full           Yes      Yes                                 +---------+---------------+---------+-----------+----------+--------------+ PTV      Full                                                        +---------+---------------+---------+-----------+----------+--------------+ PERO     Full                                                        +---------+---------------+---------+-----------+----------+--------------+  Summary: RIGHT: - There is no evidence of deep vein thrombosis in the lower extremity.  - A cystic structure is found in the popliteal fossa. - wall thickening was noted in grey scale images indicating possible post-phlebitic changes  LEFT: - wall thickening was noted in greyscale indicating possible post-phlebitic changes  *See table(s) above for measurements and observations. Electronically signed by Deitra Mayo MD on 06/24/2020 at 6:29:53 PM.    Final     Cardiac Studies   Echo 06/23/20: 1. LV function is severerly depressed The very proximal portion of LV is   vigorous. There is akinesis of the distal lateral, mid/distal septal,  mid/dital inferior, distal anterior and apical walls are akinetic.  Compared to echo report from Dec, 2020 wall  motion abnormalities and depressed LVEF are new.. Left ventricular  ejection fraction, by estimation, is 25 to 30%. The left ventricle has  severely decreased function. Left ventricular diastolic parameters are  indeterminate.  2. Akinesis of the apex.. Right ventricular systolic function is mildly  reduced. The right ventricular size is normal. There is normal pulmonary  artery systolic pressure.  3. The mitral valve is abnormal. Mild mitral valve regurgitation.  4. The aortic valve is abnormal. Aortic valve regurgitation is not  visualized. Mild aortic valve sclerosis is present, with no evidence of  aortic valve stenosis.   Patient Profile     Ms. Burbridge is a 62F with ESRD on PD, HTN, anemia of chronic disease, and atrial flutter admitted with seizure in the setting of profound metabolic derangements.  She has been treated for C. Diff but had refractory diarrhea.  She was also recently COVID-19 positive.  Her husband brought her to the hospital with seizure like activity and weakness.  Cardiology was consulted for elevated troponin and tachycardia.      Assessment & Plan    # Elevated troponin:  # Multi-vessel CAD: HS-troponin was elevated to 1978-->1554-->1397.  She denies chest pain.  During this hospitalization she was also noted to have an elevated D-dimer.  Chest CT-A was negative for pulmonary embolism.  It did reveal coronary calcification.  She is also had worsening anemia this admission requiring transfusion.    H/H is slowly trending down.  Likely due to anemia of chronic disease.  Left heart cath revealed multivessel coronary artery disease.  She had an 85% ramus, multiple LAD lesions ranging 80 to 95%, and 90% D1.  This is a complicated situation.  She is not a great candidate for  anticoagulation.  However she is also quite frail and has yet to recover fully from her surgery on her hip in December.  More recently also with C. difficile colitis.  Not much blood pressure room for optimization of medical management.  CT surgery has seen her and awaiting full consult note.  Continue aspirin, metoprolol, and atorvastatin.   # Acute systolic and diastolic heart failure: Echo concerning for stress cardiomyopathy.  However, she was found to have multivessel CAD on cath.  BNP is elevated to over thousand.  We will try to pull a little more fluid with dialysis if possible.  Continue metoprolol and plan to consolidate to succinate when blood pressure allows.  Too low for ARB.  Awaiting revascularization options as above.   # Sinus tachycardia: This admission she has persistent sinus tachycardia, but no atrial flutter has been detected.    Improved with treatment of her C. difficile.  Continue metoprolol as above.   # Atrial flutter: Ms. Morash has a history of atrial flutter 04/2019.  This was thought to be due to acute illness and the time and she was not treated on longstanding anticoagulation due to her anemia.         For questions or updates, please contact [place] Please consult [email or web address] for contact info under        Signed, Skeet Latch, MD  06/26/2020, 9:25 AM

## 2020-06-26 NOTE — Progress Notes (Signed)
Progress Note    Amy [name]  WGN:[id number] DOB: 03-17-1951  DOA: 06/19/2020 PCP: [name], MD    Brief Narrative:     Medical records reviewed and are as summarized below:  Amy [name] is an 70 y.o. female with medical history significant ofESRD on PD, HTN, multiple thyroid nodules. She had a hip fracture in December s/p ORIF. Recently hospitalized 1/25/-05/29/20 for watery diarrhea, hyponatremia, hypokalemia. She was found to be covid 19 positive but was not treated with no signs of respiratory involvement. She did test positive for C. Diff by PCR and was started on oral Vancomycin with a 9 day Rx at discharge which she was unable to get filled. She also developed sinus tachycardia with NSTEMI for which she was seen by cardiology. She is s/p heart cath and now plans to be evaluated by CVTS for CABG.  Assessment/Plan:   Active Problems:   Anxiety   Fever blister   Essential hypertension   Hypokalemia   Hyponatremia   HLD (hyperlipidemia)   ESRD on dialysis (HCC)   C. difficile diarrhea   C. difficile colitis   Seizure (HCC)   Pressure injury of skin   Protein-calorie malnutrition, severe   Non-ST elevation (NSTEMI) myocardial infarction ([place])   Ischemic cardiomyopathy   Seizure with postictal state : due to severe electrolyte abnormalities including hyponatremia, hypomagnesemia, hypokalemia caused by C. difficile diarrhea  - MRI negative -EEG shows postictal state, seen by neurology  - Potassium and magnesium now stable after aggressive replacement, has been hydrated with IV fluids - on Keppra  -Neuro recommends DC on Keppra with Neuro follow up   C. difficile infection present on admission.  Being treated with Dificid as she seems to have failed 10-day course of vancomycin.  Note C. difficile treatment with Dificid will be continued post discharge, Dificid for home has been arranged by [facility] inpatient pharmacy, with the assistant of Ysidro Evert ID pharmacist per  Dr. Candiss Norse -2/27   Recent COVID-19 incidental infection.  Supportive care.   ESRD (60yrs) .  On PD.  Renal following  Anemia of chronic disease.  Anemia panel suggestive of chronic disease, no signs of active bleeding, 1 unit of packed RBC transfusion on 06/22/2020   sinus tachycardia/ NSTEMI.  - CTA chest and leg ultrasound unremarkable - echocardiogram shows a depressed EF of 25% with typical wall motion abnormalities -s/p cath: Severe multivessel CAD with diffuse 80%, 95% and 80% proximal LAD stenosis, 90% diagonal stenosis;  85% focal ramus intermediate stenosis;  and 50% stenoses in the AV groove circumflex. -CVTS consult pending   Chronic systolic CHF with EF 25 to 30%.   -Fluid removal through PD.   Pressure Injury 06/20/20 Coccyx Medial Stage 1 -  Intact skin with non-blanchable redness of a localized area usually over a bony prominence. (Active)  06/20/20 1500  Location: Coccyx  Location Orientation: Medial  Staging: Stage 1 -  Intact skin with non-blanchable redness of a localized area usually over a bony prominence.  Wound Description (Comments):   Present on Admission: Yes       Family Communication/Anticipated D/C date and plan/Code Status   DVT prophylaxis: heparin Code Status: Full Code.  Disposition Plan: Status is: Inpatient  Remains inpatient appropriate because:Inpatient level of care appropriate due to severity of illness   Dispo: The patient is from: Home              Anticipated d/c is to: Home  Anticipated d/c date is: > 3 days              Patient currently is not medically stable to d/c.   Difficult to place patient No         Medical Consultants:    Cards  CVTS  Renal   Subjective:   No chest pain, no SOB   Objective:    Vitals:   06/26/20 0321 06/26/20 0528 06/26/20 0800 06/26/20 1010  BP: 98/64  110/68 117/82  Pulse: (!) 111  (!) 108   Resp: 17  14 18   Temp: 98.5 F (36.9 C)  97.9 F (36.6 C) 98.3 F  (36.8 C)  TempSrc: Oral  Oral Oral  SpO2: 97%  99%   Weight:  51.9 kg    Height:  5\' 4"  (1.626 m)      Intake/Output Summary (Last 24 hours) at 06/26/2020 1102 Last data filed at 06/26/2020 0542 Gross per 24 hour  Intake 1481.41 ml  Output 12 ml  Net 1469.41 ml   Filed Weights   06/25/20 0500 06/25/20 0650 06/26/20 0528  Weight: 51.1 kg 51.1 kg 51.9 kg    Exam:  General: Appearance:    Thin female in no acute distress     Lungs:     Clear to auscultation bilaterally, respirations unlabored  Heart:    Tachycardic. No murmurs, rubs, or gallops.   MS:   All extremities are intact.   Neurologic:   Awake, alert, oriented x 3.    Data Reviewed:   I have personally reviewed following labs and imaging studies:  Labs: Labs show the following:   Basic Metabolic Panel: Recent Labs  Lab 06/22/20 0139 06/22/20 2216 06/23/20 0057 06/24/20 0102 06/25/20 0104 06/25/20 1123 06/26/20 0107  NA 126*  --  130* 129* 131*  --  129*  K 4.6  --  4.0 3.2* 3.5  --  4.1  CL 96*  --  100 97* 98  --  97*  CO2 19*  --  18* 22 23  --  21*  GLUCOSE 90  --  193* 106* 110*  --  125*  BUN 38*  --  36* 28* 27*  --  30*  CREATININE 5.26*  --  5.04* 4.19* 4.02* 4.05* 4.02*  CALCIUM 7.8*  --  8.0* 7.9* 7.9*  --  8.1*  MG 2.0 1.9 1.9 1.7 1.5*  --  1.8   GFR Estimated Creatinine Clearance: 10.5 mL/min (A) (by C-G formula based on SCr of 4.02 mg/dL (H)). Liver Function Tests: Recent Labs  Lab 06/22/20 0139 06/23/20 0057 06/24/20 0102 06/25/20 0104 06/26/20 0107  AST 29 33 25 22 20   ALT 20 23 20 20 18   ALKPHOS 82 90 90 80 91  BILITOT 0.5 0.4 0.4 0.5 0.2*  PROT 3.5* 4.1* 3.9* 3.4* 3.6*  ALBUMIN 1.7* 1.9* 1.7* 1.4* 1.6*   Recent Labs  Lab 06/20/20 0313  LIPASE 95*   No results for input(s): AMMONIA in the last 168 hours. Coagulation profile Recent Labs  Lab 06/19/20 1613  INR 1.1    CBC: Recent Labs  Lab 06/22/20 0139 06/22/20 0704 06/23/20 0057 06/24/20 0102  06/25/20 0104 06/25/20 1123 06/26/20 0107  WBC 10.9*   < > 9.3 9.1 7.8 10.0 10.5  NEUTROABS 8.6*  --  7.8* 6.7 5.6  --  8.4*  HGB 6.7*   < > 9.9* 10.2* 8.7* 9.7* 8.8*  HCT 19.1*   < > 27.9* 29.8* 24.0* 26.5* 25.6*  MCV 87.2   < > 86.6 87.6 86.3 85.8 88.9  PLT 345   < > 263 278 216 271 271   < > = values in this interval not displayed.   Cardiac Enzymes: No results for input(s): CKTOTAL, CKMB, CKMBINDEX, TROPONINI in the last 168 hours. BNP (last 3 results) No results for input(s): PROBNP in the last 8760 hours. CBG: Recent Labs  Lab 06/19/20 1609  GLUCAP 140*   D-Dimer: Recent Labs    06/25/20 0104 06/26/20 0107  DDIMER 1.87* 2.74*   Hgb A1c: No results for input(s): HGBA1C in the last 72 hours. Lipid Profile: Recent Labs    06/24/20 0102  CHOL 170  HDL 88  LDLCALC 68  TRIG 68  CHOLHDL 1.9   Thyroid function studies: No results for input(s): TSH, T4TOTAL, T3FREE, THYROIDAB in the last 72 hours.  Invalid input(s): FREET3 Anemia work up: No results for input(s): VITAMINB12, FOLATE, FERRITIN, TIBC, IRON, RETICCTPCT in the last 72 hours. Sepsis Labs: Recent Labs  Lab 06/23/20 8315 06/23/20 0930 06/24/20 0102 06/25/20 0104 06/25/20 1123 06/26/20 0107  WBC  --   --  9.1 7.8 10.0 10.5  LATICACIDVEN 4.2* 3.0*  --   --   --   --     Microbiology Recent Results (from the past 240 hour(s))  MRSA PCR Screening     Status: None   Collection Time: 06/21/20 12:34 AM   Specimen: Nasopharyngeal  Result Value Ref Range Status   MRSA by PCR NEGATIVE NEGATIVE Final    Comment:        The GeneXpert MRSA Assay (FDA approved for NASAL specimens only), is one component of a comprehensive MRSA colonization surveillance program. It is not intended to diagnose MRSA infection nor to guide or monitor treatment for MRSA infections. Performed at [facility], [place] [address] [place], [place] [address]     Procedures and diagnostic studies:  CARDIAC  CATHETERIZATION  Result Date: 06/25/2020  Mid Cx lesion is 50% stenosed.  Dist Cx lesion is 50% stenosed.  Ramus lesion is 85% stenosed.  Prox LAD to Mid LAD lesion is 85% stenosed.  Mid LAD-1 lesion is 95% stenosed.  Mid LAD-2 lesion is 80% stenosed.  1st Diag lesion is 90% stenosed.  Severe multivessel CAD with diffuse 80%, 95% and 80% proximal LAD stenosis, 90% diagonal stenosis;  85% focal ramus intermediate stenosis;  and 50% stenoses in the AV groove circumflex. The right coronary artery is a dominant normal vessel. LVEDP 8 mmHg RECOMMENDATION: With severe LV dysfunction with EF of 25% and diffuse artery disease recommend initial surgical consultation consideration of CABG revascularization.  If we will to perform surgery, then will need an interventional approach to the LAD and ramus.  Will review with colleagues.    Medications:   . sodium chloride   Intravenous Once  . aspirin  81 mg Oral Daily  . atorvastatin  80 mg Oral q1800  . buPROPion  75 mg Oral BID  . calcitRIOL  0.5 mcg Oral Daily  . Chlorhexidine Gluconate Cloth  6 each Topical Daily  . cinacalcet  30 mg Oral Q breakfast  . [START ON 06/28/2020] darbepoetin (ARANESP) injection - NON-DIALYSIS  100 mcg Subcutaneous Q Sat-1800  . feeding supplement (NEPRO CARB STEADY)  237 mL Oral BID BM  . fidaxomicin  200 mg Oral BID  . gentamicin cream  1 application Topical Daily  . heparin  5,000 Units Subcutaneous Q8H  . metoprolol tartrate  25  mg Oral BID  . multivitamin  1 tablet Oral QHS  . pantoprazole  40 mg Oral Daily  . sodium chloride flush  3 mL Intravenous Once  . sodium chloride flush  3 mL Intravenous Q12H  . sodium chloride flush  3 mL Intravenous Q12H   Continuous Infusions: . sodium chloride    . levETIRAcetam 500 mg (06/26/20 0516)     LOS: 7 days   Geradine Girt  [facility]   How to contact the [facility] Attending or Consulting provider [place] or covering provider during after hours [place], for  this patient?  1. Check the care team in [facility] and look for a) attending/consulting [facility] provider listed and b) the [facility] team listed 2. Log into [email or web address] and use [place]'s universal password to access. If you do not have the password, please contact the hospital operator. 3. Locate the [facility] provider you are looking for under [facility] and page to a number that you can be directly reached. 4. If you still have difficulty reaching the provider, please page the [facility] (Director on Call) for the Hospitalists listed on amion for assistance.  06/26/2020, 11:02 AM

## 2020-06-26 NOTE — Progress Notes (Signed)
VASCULAR LAB    Pre CABG Dopplers has been performed.  See CV proc for preliminary results.   KANADY, CANDACE, RVT 06/26/2020, 11:28 AM

## 2020-06-26 NOTE — Telephone Encounter (Signed)
  Amy [name] has a hospital follow up scheduled for 07-09-2020, but this is too far out for a hospital follow up. Can she be moved to monday 2-28 at 3:40 or Wednesday 3-2 at 11:20 (Monday would be better)

## 2020-06-26 NOTE — Progress Notes (Signed)
Nephrology Follow-Up Consult note   Assessment/Recommendations: Amy [name] is a/an 69 y.o. female with a past medical history significant for ESRD on PD admitted with multiple electrolyte derangements, diarrhea, C. difficile, possible seizure.   # ESRD:  - Continue PD - mix 1.5 and 2.5% dextrose tonight  # Volume/ hypertension:   - slightly volume up today - Controlled with PD as above  # Multivessel CAD - per cardiology and CT surgery.    # Heart failure reduced EF - saline lock IV. Gentle UF as above  # Anemia of Chronic Kidney Disease:  on ESA - increase to aranesp 100 mcg every sat (for the 2/26 dose)  # Secondary Hyperparathyroidism/Hyperphosphatemia: Continue calcitriol 0.5 MCG daily and Sensipar 30 mg daily. Phos in am  #Hyponatremia: on PD as above.  #Hypokalemia: repleted; improved  #Acidosis: Mild metabolic acidosis likely related to diarrhea. stopped oral bicarbonate   #Possible seizure: Receiving Keppra.    Neurology has been consulted   ________________________________________________________   Interval History/Subjective: had PD overnight.  Strict ins/outs not available.  CTS being consulted after cath with multivessel disease - hasn't seen her yet she states.  She has 50 ml fluids going in. If at home she would normally do 1.5 and 2.5% mix tonight - she had never used a 4.25% before Sunday.  States not unusual to have a little swelling- goes and comes.    2/23 - 627 mL UF 2/22 am had 483 ml UF 2/21 am had 2.9 liters UF   Review of systems:    Denies shortness of breath or chest pain  Nausea yesterday after chicken - now resolved; no emesis Diarrhea seems better - normal BM yesterday  Medications:  Current Facility-Administered Medications  Medication Dose Route Frequency Provider Last Rate Last Admin  . 0.9 %  sodium chloride infusion (Manually program via Guardrails IV Fluids)   Intravenous Once Thurnell Lose, MD   Held at 06/22/20 [phone]  . 0.9 %   sodium chloride infusion  250 mL Intravenous PRN Troy Sine, MD      . 0.9 %  sodium chloride infusion   Intravenous Continuous Troy Sine, MD 50 mL/hr at 06/26/20 0321 Rate Verify at 06/26/20 0321  . acetaminophen (TYLENOL) tablet 650 mg  650 mg Oral Q4H PRN Troy Sine, MD      . aspirin chewable tablet 81 mg  81 mg Oral Daily Troy Sine, MD   81 mg at 06/25/20 1147  . atorvastatin (LIPITOR) tablet 80 mg  80 mg Oral q1800 Troy Sine, MD   80 mg at 06/25/20 1727  . buPROPion [facility]) tablet 75 mg  75 mg Oral BID Eulogio Bear U, DO   75 mg at 06/25/20 2129  . calcitRIOL (ROCALTROL) capsule 0.5 mcg  0.5 mcg Oral Daily Reesa Chew, MD   0.5 mcg at 06/24/20 1042  . Chlorhexidine Gluconate Cloth 2 % PADS 6 each  6 each Topical Daily Norins, Heinz Knuckles, MD   6 each at 06/24/20 1043  . cinacalcet (SENSIPAR) tablet 30 mg  30 mg Oral Q breakfast Reesa Chew, MD   30 mg at 06/25/20 1148  . Darbepoetin Alfa (ARANESP) injection 60 mcg  60 mcg Subcutaneous Q Sat-1800 Reesa Chew, MD      . diazepam (VALIUM) tablet 5 mg  5 mg Oral Q6H PRN Troy Sine, MD      . enoxaparin (LOVENOX) injection 30 mg  30 mg Subcutaneous  Q24H Troy Sine, MD      . feeding supplement (NEPRO CARB STEADY) liquid 237 mL  237 mL Oral BID BM Thurnell Lose, MD   237 mL at 06/25/20 1450  . fidaxomicin (DIFICID) tablet 200 mg  200 mg Oral BID Neena Rhymes, MD   200 mg at 06/25/20 2129  . gentamicin cream (GARAMYCIN) 0.1 % 1 application  1 application Topical Daily Reesa Chew, MD   1 application at 16/10/96 1043  . Gerhardt's butt cream   Topical PRN Thurnell Lose, MD   Given at 06/22/20 1616  . heparin 2,500 Units in dialysis solution 2.5% low-MG/low-CA 5,000 mL dialysis solution   Peritoneal Dialysis PRN Reesa Chew, MD      . levETIRAcetam (KEPPRA) IVPB 500 mg/100 mL premix  500 mg Intravenous Q12H Perfecto Kingdom, MD 400 mL/hr at 06/26/20 0516 500 mg at  06/26/20 0516  . metoprolol tartrate (LOPRESSOR) tablet 25 mg  25 mg Oral BID Thurnell Lose, MD   25 mg at 06/25/20 2129  . multivitamin (RENA-VIT) tablet 1 tablet  1 tablet Oral QHS Thurnell Lose, MD   1 tablet at 06/25/20 2129  . ondansetron (ZOFRAN) injection 4 mg  4 mg Intravenous Q6H PRN Troy Sine, MD   4 mg at 06/25/20 2341  . pantoprazole (PROTONIX) EC tablet 40 mg  40 mg Oral Daily Eulogio Bear U, DO   40 mg at 06/25/20 1727  . sodium chloride flush (NS) 0.9 % injection 3 mL  3 mL Intravenous Once Carmin Muskrat, MD      . sodium chloride flush (NS) 0.9 % injection 3 mL  3 mL Intravenous Q12H Kathyrn Drown D, NP   3 mL at 06/24/20 2119  . sodium chloride flush (NS) 0.9 % injection 3 mL  3 mL Intravenous Q12H Troy Sine, MD   3 mL at 06/25/20 2131  . sodium chloride flush (NS) 0.9 % injection 3 mL  3 mL Intravenous PRN Troy Sine, MD      . traZODone (DESYREL) tablet 25 mg  25 mg Oral QHS PRN Neena Rhymes, MD   25 mg at 06/25/20 2131      Physical Exam: Vitals:   06/25/20 2318 06/26/20 0321  BP: 109/79 98/64  Pulse: (!) 105 (!) 111  Resp: (!) 21 17  Temp: 99.1 F (37.3 C) 98.5 F (36.9 C)  SpO2: 97% 97%   Total I/O In: 1022.8 [P.O.:360; I.V.:562.8; IV Piggyback:100] Out: 10 [Emesis/NG output:10]  Intake/Output Summary (Last 24 hours) at 06/26/2020 0454 Last data filed at 06/26/2020 0542 Gross per 24 hour  Intake 1481.41 ml  Output 12 ml  Net 1469.41 ml    General adult female in bed in no acute distress HEENT normocephalic atraumatic extraocular movements intact sclera anicteric Neck supple trachea midline Lungs clear to auscultation bilaterally normal work of breathing at rest  Heart S1s2 no rub Abdomen soft nontender nondistended Extremities 1+ edema  Psych normal mood and affect Neuro alert and oriented x 3 provides hx and follows commands Access: tenckhoff dressing intact, clean  Test Results I personally reviewed new and old  clinical labs and radiology tests Lab Results  Component Value Date   NA 129 (L) 06/26/2020   K 4.1 06/26/2020   CL 97 (L) 06/26/2020   CO2 21 (L) 06/26/2020   BUN 30 (H) 06/26/2020   CREATININE 4.02 (H) 06/26/2020   GLU 103 04/30/2014   CALCIUM  8.1 (L) 06/26/2020   ALBUMIN 1.6 (L) 06/26/2020   PHOS 6.1 (H) 11/08/2018     Outpt prescription Access  PD Catheter Right lower quadrant   Schedule  QD   PD Type  PD with Cycler   Vendor  [id number]   Calcium  2.50 mEq   Magnesium  0.5 mEq   Dextrose  Varied   Dextrose Instructions  Dextrose strength dependant on BP, weight, and edema.   Total Fills: Dry (Checked)  4   Total Fills: Dry (Checked) Details  4 Fills per Cycle @ 2000 ml   Cycle  Day   Cycle Drain Time  20 min   Cycle Fill Time  10 min   Cycle Dwell Time  1 hrs 49 min        Claudia Desanctis, MD 06/26/2020 6:39 AM

## 2020-06-27 ENCOUNTER — Inpatient Hospital Stay (HOSPITAL_COMMUNITY)
Admission: EM | Disposition: A | Discharge status: Rehabilitation Facility | Payer: Self-pay | Source: Home / Self Care | Attending: Internal Medicine

## 2020-06-27 DIAGNOSIS — A0472 Enterocolitis due to Clostridium difficile, not specified as recurrent: Secondary | ICD-10-CM | POA: Diagnosis not present

## 2020-06-27 DIAGNOSIS — I2510 Atherosclerotic heart disease of native coronary artery without angina pectoris: Secondary | ICD-10-CM | POA: Diagnosis not present

## 2020-06-27 DIAGNOSIS — I214 Non-ST elevation (NSTEMI) myocardial infarction: Secondary | ICD-10-CM | POA: Diagnosis not present

## 2020-06-27 DIAGNOSIS — I5043 Acute on chronic combined systolic (congestive) and diastolic (congestive) heart failure: Secondary | ICD-10-CM | POA: Diagnosis not present

## 2020-06-27 DIAGNOSIS — E871 Hypo-osmolality and hyponatremia: Secondary | ICD-10-CM | POA: Diagnosis not present

## 2020-06-27 HISTORY — PX: INTRAVASCULAR IMAGING/OCT: CATH118326

## 2020-06-27 HISTORY — PX: CORONARY STENT INTERVENTION: CATH118234

## 2020-06-27 LAB — COMPREHENSIVE METABOLIC PANEL
ALT: 21 U/L (ref 0–44)
AST: 19 U/L (ref 15–41)
Albumin: 1.6 g/dL — ABNORMAL LOW (ref 3.5–5.0)
Alkaline Phosphatase: 77 U/L (ref 38–126)
Anion gap: 11 (ref 5–15)
BUN: 29 mg/dL — ABNORMAL HIGH (ref 8–23)
CO2: 23 mmol/L (ref 22–32)
Calcium: 8.4 mg/dL — ABNORMAL LOW (ref 8.9–10.3)
Chloride: 96 mmol/L — ABNORMAL LOW (ref 98–111)
Creatinine, Ser: 4.19 mg/dL — ABNORMAL HIGH (ref 0.44–1.00)
GFR, Estimated: 11 mL/min — ABNORMAL LOW (ref >60)
Glucose, Bld: 104 mg/dL — ABNORMAL HIGH (ref 70–99)
Potassium: 3.5 mmol/L (ref 3.5–5.1)
Sodium: 130 mmol/L — ABNORMAL LOW (ref 135–145)
Total Bilirubin: 0.3 mg/dL (ref 0.3–1.2)
Total Protein: 3.6 g/dL — ABNORMAL LOW (ref 6.5–8.1)

## 2020-06-27 LAB — CBC WITH DIFFERENTIAL/PLATELET
Abs Immature Granulocytes: 0.12 10*3/uL — ABNORMAL HIGH (ref 0.00–0.07)
Basophils Absolute: 0.1 10*3/uL (ref 0.0–0.1)
Basophils Relative: 1 %
Eosinophils Absolute: 0.2 10*3/uL (ref 0.0–0.5)
Eosinophils Relative: 2 %
HCT: 24 % — ABNORMAL LOW (ref 36.0–46.0)
Hemoglobin: 8.1 g/dL — ABNORMAL LOW (ref 12.0–15.0)
Immature Granulocytes: 1 %
Lymphocytes Relative: 14 %
Lymphs Abs: 1.2 10*3/uL (ref 0.7–4.0)
MCH: 30 pg (ref 26.0–34.0)
MCHC: 33.8 g/dL (ref 30.0–36.0)
MCV: 88.9 fL (ref 80.0–100.0)
Monocytes Absolute: 0.8 10*3/uL (ref 0.1–1.0)
Monocytes Relative: 10 %
Neutro Abs: 6.3 10*3/uL (ref 1.7–7.7)
Neutrophils Relative %: 72 %
Platelets: 246 10*3/uL (ref 150–400)
RBC: 2.7 MIL/uL — ABNORMAL LOW (ref 3.87–5.11)
RDW: 18.4 % — ABNORMAL HIGH (ref 11.5–15.5)
WBC: 8.7 10*3/uL (ref 4.0–10.5)
nRBC: 0 % (ref 0.0–0.2)

## 2020-06-27 LAB — POCT ACTIVATED CLOTTING TIME
Activated Clotting Time: 249 seconds
Activated Clotting Time: 297 seconds

## 2020-06-27 LAB — D-DIMER, QUANTITATIVE: D-Dimer, Quant: 2.9 ug/mL-FEU — ABNORMAL HIGH (ref 0.00–0.50)

## 2020-06-27 LAB — MAGNESIUM: Magnesium: 1.8 mg/dL (ref 1.7–2.4)

## 2020-06-27 LAB — CBC
HCT: 22.8 % — ABNORMAL LOW (ref 36.0–46.0)
Hemoglobin: 7.5 g/dL — ABNORMAL LOW (ref 12.0–15.0)
MCH: 30 pg (ref 26.0–34.0)
MCHC: 32.9 g/dL (ref 30.0–36.0)
MCV: 91.2 fL (ref 80.0–100.0)
Platelets: 236 10*3/uL (ref 150–400)
RBC: 2.5 MIL/uL — ABNORMAL LOW (ref 3.87–5.11)
RDW: 18.6 % — ABNORMAL HIGH (ref 11.5–15.5)
WBC: 9.8 10*3/uL (ref 4.0–10.5)
nRBC: 0 % (ref 0.0–0.2)

## 2020-06-27 LAB — BRAIN NATRIURETIC PEPTIDE: B Natriuretic Peptide: 1371.7 pg/mL — ABNORMAL HIGH (ref 0.0–100.0)

## 2020-06-27 LAB — PHOSPHORUS: Phosphorus: 3.1 mg/dL (ref 2.5–4.6)

## 2020-06-27 LAB — C-REACTIVE PROTEIN: CRP: 0.7 mg/dL (ref <1.0)

## 2020-06-27 SURGERY — CORONARY STENT INTERVENTION
Anesthesia: LOCAL

## 2020-06-27 MED ORDER — SODIUM CHLORIDE 0.9% FLUSH
3.0000 mL | Freq: Two times a day (BID) | INTRAVENOUS | Status: DC
  Administered 2020-06-28 – 2020-07-03 (×10): 3 mL via INTRAVENOUS

## 2020-06-27 MED ORDER — FUROSEMIDE 10 MG/ML IJ SOLN
80.0000 mg | Freq: Once | INTRAMUSCULAR | Status: AC
  Administered 2020-06-27: 80 mg via INTRAVENOUS
  Filled 2020-06-27: qty 8

## 2020-06-27 MED ORDER — FENTANYL CITRATE (PF) 100 MCG/2ML IJ SOLN
INTRAMUSCULAR | Status: DC | PRN
  Administered 2020-06-27 (×2): 25 ug via INTRAVENOUS

## 2020-06-27 MED ORDER — BUTALBITAL-APAP-CAFFEINE 50-325-40 MG PO TABS
1.0000 | ORAL_TABLET | Freq: Once | ORAL | Status: AC
  Administered 2020-06-27: 1 via ORAL
  Filled 2020-06-27: qty 1

## 2020-06-27 MED ORDER — NITROGLYCERIN 1 MG/10 ML FOR IR/CATH LAB
INTRA_ARTERIAL | Status: DC | PRN
  Administered 2020-06-27: 50 ug via INTRACORONARY

## 2020-06-27 MED ORDER — HEPARIN SODIUM (PORCINE) 1000 UNIT/ML IJ SOLN
INTRAMUSCULAR | Status: AC
  Filled 2020-06-27: qty 1

## 2020-06-27 MED ORDER — LIDOCAINE HCL (PF) 1 % IJ SOLN
INTRAMUSCULAR | Status: DC | PRN
  Administered 2020-06-27: 2 mL

## 2020-06-27 MED ORDER — HEPARIN (PORCINE) IN NACL 1000-0.9 UT/500ML-% IV SOLN
INTRAVENOUS | Status: DC | PRN
  Administered 2020-06-27: 500 mL

## 2020-06-27 MED ORDER — ENSURE ENLIVE PO LIQD
237.0000 mL | Freq: Three times a day (TID) | ORAL | Status: DC
  Administered 2020-06-29 – 2020-07-02 (×4): 237 mL via ORAL

## 2020-06-27 MED ORDER — HEPARIN (PORCINE) IN NACL 1000-0.9 UT/500ML-% IV SOLN
INTRAVENOUS | Status: AC
  Filled 2020-06-27: qty 1500

## 2020-06-27 MED ORDER — SODIUM CHLORIDE 0.9 % IV SOLN: 250.0000 mL | INTRAVENOUS | Status: DC | PRN

## 2020-06-27 MED ORDER — LIDOCAINE HCL (PF) 1 % IJ SOLN
INTRAMUSCULAR | Status: AC
  Filled 2020-06-27: qty 30

## 2020-06-27 MED ORDER — HEPARIN (PORCINE) IN NACL 1000-0.9 UT/500ML-% IV SOLN
INTRAVENOUS | Status: DC | PRN
Start: 2020-06-27 — End: 2020-06-27
  Administered 2020-06-27: 500 mL

## 2020-06-27 MED ORDER — IOHEXOL 350 MG/ML SOLN
INTRAVENOUS | Status: DC | PRN
  Administered 2020-06-27: 85 mL

## 2020-06-27 MED ORDER — MIDAZOLAM HCL 2 MG/2ML IJ SOLN
INTRAMUSCULAR | Status: AC
  Filled 2020-06-27: qty 2

## 2020-06-27 MED ORDER — HEPARIN SODIUM (PORCINE) 1000 UNIT/ML IJ SOLN
INTRAMUSCULAR | Status: DC | PRN
  Administered 2020-06-27: 2000 [IU] via INTRAVENOUS
  Administered 2020-06-27: 5000 [IU] via INTRAVENOUS

## 2020-06-27 MED ORDER — POTASSIUM CHLORIDE CRYS ER 10 MEQ PO TBCR
10.0000 meq | EXTENDED_RELEASE_TABLET | Freq: Once | ORAL | Status: AC
  Administered 2020-06-27: 10 meq via ORAL
  Filled 2020-06-27: qty 1

## 2020-06-27 MED ORDER — VERAPAMIL HCL 2.5 MG/ML IV SOLN
INTRAVENOUS | Status: AC
  Filled 2020-06-27: qty 2

## 2020-06-27 MED ORDER — ACETAMINOPHEN 325 MG PO TABS
650.0000 mg | ORAL_TABLET | ORAL | Status: DC | PRN
  Administered 2020-06-27: 650 mg via ORAL
  Filled 2020-06-27: qty 2

## 2020-06-27 MED ORDER — NITROGLYCERIN 1 MG/10 ML FOR IR/CATH LAB
INTRA_ARTERIAL | Status: AC
  Filled 2020-06-27: qty 10

## 2020-06-27 MED ORDER — MIDAZOLAM HCL 2 MG/2ML IJ SOLN
INTRAMUSCULAR | Status: DC | PRN
  Administered 2020-06-27 (×2): 1 mg via INTRAVENOUS

## 2020-06-27 MED ORDER — SODIUM CHLORIDE 0.9% FLUSH: 3.0000 mL | INTRAVENOUS | Status: DC | PRN

## 2020-06-27 MED ORDER — LABETALOL HCL 5 MG/ML IV SOLN: 10.0000 mg | INTRAVENOUS | Status: AC | PRN

## 2020-06-27 MED ORDER — VERAPAMIL HCL 2.5 MG/ML IV SOLN
INTRAVENOUS | Status: DC | PRN
  Administered 2020-06-27: 10 mL via INTRA_ARTERIAL

## 2020-06-27 MED ORDER — HYDRALAZINE HCL 20 MG/ML IJ SOLN: 10.0000 mg | INTRAMUSCULAR | Status: AC | PRN

## 2020-06-27 MED ORDER — FENTANYL CITRATE (PF) 100 MCG/2ML IJ SOLN
INTRAMUSCULAR | Status: AC
  Filled 2020-06-27: qty 2

## 2020-06-27 MED ORDER — METOPROLOL SUCCINATE ER 25 MG PO TB24
25.0000 mg | ORAL_TABLET | Freq: Every day | ORAL | Status: DC
  Administered 2020-06-27 – 2020-07-04 (×8): 25 mg via ORAL
  Filled 2020-06-27 (×8): qty 1

## 2020-06-27 SURGICAL SUPPLY — 19 items
BAG SNAP BAND KOVER 36X36 (MISCELLANEOUS) ×2 IMPLANT
BALLN SAPPHIRE 2.5X15 (BALLOONS) ×2
BALLN SAPPHIRE ~~LOC~~ 3.0X15 (BALLOONS) ×2 IMPLANT
BALLOON SAPPHIRE 2.5X15 (BALLOONS) ×1 IMPLANT
CATH DRAGONFLY OPSTAR (CATHETERS) ×2 IMPLANT
CATH LAUNCHER 6FR EBU3.5 (CATHETERS) ×2 IMPLANT
COVER DOME SNAP 22 D (MISCELLANEOUS) ×2 IMPLANT
DEVICE RAD COMP TR BAND LRG (VASCULAR PRODUCTS) ×2 IMPLANT
ELECT DEFIB PAD ADLT CADENCE (PAD) ×2 IMPLANT
GLIDESHEATH SLEND SS 6F .021 (SHEATH) ×2 IMPLANT
GUIDEWIRE INQWIRE 1.5J.035X260 (WIRE) ×1 IMPLANT
INQWIRE 1.5J .035X260CM (WIRE) ×2
KIT ENCORE 26 ADVANTAGE (KITS) ×2 IMPLANT
KIT HEART LEFT (KITS) ×2 IMPLANT
PACK CARDIAC CATHETERIZATION (CUSTOM PROCEDURE TRAY) ×2 IMPLANT
STENT RESOLUTE ONYX 2.75X22 (Permanent Stent) ×2 IMPLANT
TRANSDUCER W/STOPCOCK (MISCELLANEOUS) ×2 IMPLANT
TUBING CIL FLEX 10 FLL-RA (TUBING) ×2 IMPLANT
WIRE COUGAR XT STRL 190CM (WIRE) ×2 IMPLANT

## 2020-06-27 NOTE — Progress Notes (Signed)
Progress Note    Amy [name]  OIZ:[id number] DOB: 1950-11-04  DOA: 06/19/2020 PCP: [name], MD    Brief Narrative:     Medical records reviewed and are as summarized below:  Amy [name] is an 70 y.o. female with medical history significant ofESRD on PD, HTN, multiple thyroid nodules. She had a hip fracture in December s/p ORIF. Recently hospitalized 1/25/-05/29/20 for watery diarrhea, hyponatremia, hypokalemia. She was found to be covid 19 positive but was not treated with no signs of respiratory involvement. She did test positive for C. Diff by PCR and was started on oral Vancomycin with a 9 day Rx at discharge which she was unable to get filled. She also developed sinus tachycardia with NSTEMI for which she was seen by cardiology. She is s/p heart cath and now plans to be evaluated by CVTS for CABG. Deemed not a candidate so will go to cath lab 2/25  Assessment/Plan:   Active Problems:   Anxiety   Fever blister   Essential hypertension   Hypokalemia   Hyponatremia   HLD (hyperlipidemia)   ESRD on dialysis (HCC)   C. difficile diarrhea   C. difficile colitis   Seizure (HCC)   Pressure injury of skin   Protein-calorie malnutrition, severe   Non-ST elevation (NSTEMI) myocardial infarction ([place])   Ischemic cardiomyopathy   Seizure with postictal state : due to severe electrolyte abnormalities including hyponatremia, hypomagnesemia, hypokalemia caused by C. difficile diarrhea  - MRI negative -EEG shows postictal state, seen by neurology  - Potassium and magnesium now stable after aggressive replacement, has been hydrated with IV fluids - on Keppra  -Neuro recommends DC on Keppra with Neuro follow up   C. difficile infection present on admission.  Being treated with Dificid as she seems to have failed 10-day course of vancomycin.  Note C. difficile treatment with Dificid will be continued post discharge, Dificid for home has been arranged by [facility] inpatient  pharmacy, with the assistant of Ysidro Evert ID pharmacist per Dr. Candiss Norse -abx through 2/27   Recent COVID-19 incidental infection.  Supportive care.   ESRD (71yrs) .  On PD.  Renal following  Anemia of chronic disease.  Anemia panel suggestive of chronic disease, no signs of active bleeding, 1 unit of packed RBC transfusion on 06/22/2020   sinus tachycardia/ NSTEMI.  - CTA chest and leg ultrasound unremarkable - echocardiogram shows a depressed EF of 25% with typical wall motion abnormalities -s/p cath: Severe multivessel CAD with diffuse 80%, 95% and 80% proximal LAD stenosis, 90% diagonal stenosis;  85% focal ramus intermediate stenosis;  and 50% stenoses in the AV groove circumflex. -CVTS: not a candidate for CABG, unable to get cardiac MRI -return to cath lab on 2/25 for intervention   Chronic systolic CHF with EF 25 to 30%.   -Fluid removal through PD.   Pressure Injury 06/20/20 Coccyx Medial Stage 1 -  Intact skin with non-blanchable redness of a localized area usually over a bony prominence. (Active)  06/20/20 1500  Location: Coccyx  Location Orientation: Medial  Staging: Stage 1 -  Intact skin with non-blanchable redness of a localized area usually over a bony prominence.  Wound Description (Comments):   Present on Admission: Yes       Family Communication/Anticipated D/C date and plan/Code Status   DVT prophylaxis: heparin Code Status: Full Code.  Disposition Plan: Status is: Inpatient  Remains inpatient appropriate because:Inpatient level of care appropriate due to severity of illness  Dispo: The patient is from: Home              Anticipated d/c is to: Home              Anticipated d/c date is:2-3 days              Patient currently is not medically stable to d/c.   Difficult to place patient No         Medical Consultants:    Cards  CVTS  Renal   Subjective:   No overnight events- plan for cath labs today   Objective:    Vitals:    06/27/20 0600 06/27/20 0618 06/27/20 0754 06/27/20 1136  BP: 103/62 102/63 95/61 106/62  Pulse:   97 88  Resp:   20 11  Temp: 98.5 F (36.9 C) 98.5 F (36.9 C) 98.5 F (36.9 C) 98.6 F (37 C)  TempSrc: Oral Oral Oral Oral  SpO2: 99%  97% 100%  Weight: 53 kg     Height:        Intake/Output Summary (Last 24 hours) at 06/27/2020 1306 Last data filed at 06/27/2020 0600 Gross per 24 hour  Intake 120.43 ml  Output -  Net 120.43 ml   Filed Weights   06/26/20 1736 06/27/20 0530 06/27/20 0600  Weight: 54.2 kg 53 kg 53 kg    Exam:   General: Appearance:    Chronically ill appearing female in no acute distress     Lungs:     respirations unlabored             Data Reviewed:   I have personally reviewed following labs and imaging studies:  Labs: Labs show the following:   Basic Metabolic Panel: Recent Labs  Lab 06/23/20 0057 06/24/20 0102 06/25/20 0104 06/25/20 1123 06/26/20 0107 06/27/20 0026  NA 130* 129* 131*  --  129* 130*  K 4.0 3.2* 3.5  --  4.1 3.5  CL 100 97* 98  --  97* 96*  CO2 18* 22 23  --  21* 23  GLUCOSE 193* 106* 110*  --  125* 104*  BUN 36* 28* 27*  --  30* 29*  CREATININE 5.04* 4.19* 4.02* 4.05* 4.02* 4.19*  CALCIUM 8.0* 7.9* 7.9*  --  8.1* 8.4*  MG 1.9 1.7 1.5*  --  1.8 1.8  PHOS  --   --   --   --   --  3.1   GFR Estimated Creatinine Clearance: 10.3 mL/min (A) (by C-G formula based on SCr of 4.19 mg/dL (H)). Liver Function Tests: Recent Labs  Lab 06/23/20 0057 06/24/20 0102 06/25/20 0104 06/26/20 0107 06/27/20 0026  AST 33 25 22 20 19   ALT 23 20 20 18 21   ALKPHOS 90 90 80 91 77  BILITOT 0.4 0.4 0.5 0.2* 0.3  PROT 4.1* 3.9* 3.4* 3.6* 3.6*  ALBUMIN 1.9* 1.7* 1.4* 1.6* 1.6*   No results for input(s): LIPASE, AMYLASE in the last 168 hours. No results for input(s): AMMONIA in the last 168 hours. Coagulation profile No results for input(s): INR, PROTIME in the last 168 hours.  CBC: Recent Labs  Lab 06/23/20 0057 06/24/20 0102  06/25/20 0104 06/25/20 1123 06/26/20 0107 06/27/20 0026  WBC 9.3 9.1 7.8 10.0 10.5 8.7  NEUTROABS 7.8* 6.7 5.6  --  8.4* 6.3  HGB 9.9* 10.2* 8.7* 9.7* 8.8* 8.1*  HCT 27.9* 29.8* 24.0* 26.5* 25.6* 24.0*  MCV 86.6 87.6 86.3 85.8 88.9 88.9  PLT 263 278 216  271 271 246   Cardiac Enzymes: No results for input(s): CKTOTAL, CKMB, CKMBINDEX, TROPONINI in the last 168 hours. BNP (last 3 results) No results for input(s): PROBNP in the last 8760 hours. CBG: No results for input(s): GLUCAP in the last 168 hours. D-Dimer: Recent Labs    06/26/20 0107 06/27/20 0026  DDIMER 2.74* 2.90*   Hgb A1c: No results for input(s): HGBA1C in the last 72 hours. Lipid Profile: No results for input(s): CHOL, HDL, LDLCALC, TRIG, CHOLHDL, LDLDIRECT in the last 72 hours. Thyroid function studies: No results for input(s): TSH, T4TOTAL, T3FREE, THYROIDAB in the last 72 hours.  Invalid input(s): FREET3 Anemia work up: No results for input(s): VITAMINB12, FOLATE, FERRITIN, TIBC, IRON, RETICCTPCT in the last 72 hours. Sepsis Labs: Recent Labs  Lab 06/23/20 0652 06/23/20 0930 06/24/20 0102 06/25/20 0104 06/25/20 1123 06/26/20 0107 06/27/20 0026  WBC  --   --    < > 7.8 10.0 10.5 8.7  LATICACIDVEN 4.2* 3.0*  --   --   --   --   --    < > = values in this interval not displayed.    Microbiology Recent Results (from the past 240 hour(s))  MRSA PCR Screening     Status: None   Collection Time: 06/21/20 12:34 AM   Specimen: Nasopharyngeal  Result Value Ref Range Status   MRSA by PCR NEGATIVE NEGATIVE Final    Comment:        The GeneXpert MRSA Assay (FDA approved for NASAL specimens only), is one component of a comprehensive MRSA colonization surveillance program. It is not intended to diagnose MRSA infection nor to guide or monitor treatment for MRSA infections. Performed at [facility], [place] [address] [place], [place] [address]     Procedures and diagnostic studies:  VAS US  DOPPLER PRE CABG  Result Date: 06/26/2020 PREOPERATIVE VASCULAR EVALUATION  Indications:      Pre-CABG. Risk Factors:     Hypertension, hyperlipidemia, coronary artery disease. Comparison Study: No prior study Performing Technologist: Sharion Dove RVS  Examination Guidelines: A complete evaluation includes B-mode imaging, spectral Doppler, color Doppler, and power Doppler as needed of all accessible portions of each vessel. Bilateral testing is considered an integral part of a complete examination. Limited examinations for reoccurring indications may be performed as noted.  Right Carotid Findings: +----------+--------+--------+--------+------------+------------------+           PSV cm/sEDV cm/sStenosisDescribe    Comments           +----------+--------+--------+--------+------------+------------------+ CCA Prox  79      25                          intimal thickening +----------+--------+--------+--------+------------+------------------+ CCA Distal62      25                          intimal thickening +----------+--------+--------+--------+------------+------------------+ ICA Prox  63      17              heterogenous                   +----------+--------+--------+--------+------------+------------------+ ICA Distal77      31                                             +----------+--------+--------+--------+------------+------------------+  ECA       48      11                                             +----------+--------+--------+--------+------------+------------------+ Portions of this table do not appear on this page. +----------+--------+-------+--------+------------+           PSV cm/sEDV cmsDescribeArm Pressure +----------+--------+-------+--------+------------+ Subclavian59                                  +----------+--------+-------+--------+------------+ +---------+--------+--+--------+--+ VertebralPSV cm/s70EDV cm/s22  +---------+--------+--+--------+--+ Left Carotid Findings: +----------+--------+--------+--------+------------+------------------+           PSV cm/sEDV cm/sStenosisDescribe    Comments           +----------+--------+--------+--------+------------+------------------+ CCA Prox  72      20                          intimal thickening +----------+--------+--------+--------+------------+------------------+ CCA Distal53      18                          intimal thickening +----------+--------+--------+--------+------------+------------------+ ICA Prox  61      30              heterogenous                   +----------+--------+--------+--------+------------+------------------+ ICA Distal83      34                                             +----------+--------+--------+--------+------------+------------------+ ECA       46      10                                             +----------+--------+--------+--------+------------+------------------+ +----------+--------+--------+--------+------------+ SubclavianPSV cm/sEDV cm/sDescribeArm Pressure +----------+--------+--------+--------+------------+           83                                   +----------+--------+--------+--------+------------+ +---------+--------+--+--------+--+ VertebralPSV cm/s48EDV cm/s15 +---------+--------+--+--------+--+  ABI Findings: +--------+------------------+-----+-----------+--------+ Right   Rt Pressure (mmHg)IndexWaveform   Comment  +--------+------------------+-----+-----------+--------+ [id number]                    triphasic           +--------+------------------+-----+-----------+--------+ PTA                            multiphasic         +--------+------------------+-----+-----------+--------+ DP                             multiphasic         +--------+------------------+-----+-----------+--------+  +--------+------------------+-----+-----------+-------+ Left    Lt Pressure (mmHg)IndexWaveform   Comment +--------+------------------+-----+-----------+-------+ [id number]  triphasic          +--------+------------------+-----+-----------+-------+ PTA                            multiphasic        +--------+------------------+-----+-----------+-------+ DP                             multiphasic        +--------+------------------+-----+-----------+-------+  Right Doppler Findings: +-----------+--------+-----+-----------+---------------------------------------+ Site       PressureIndexDoppler    Comments                                +-----------+--------+-----+-----------+---------------------------------------+ Brachial   117          triphasic                                          +-----------+--------+-----+-----------+---------------------------------------+ Radial                  multiphasic                                        +-----------+--------+-----+-----------+---------------------------------------+ Ulnar                   multiphasic                                        +-----------+--------+-----+-----------+---------------------------------------+ Palmar Arch                        Doppler signal reverses with both                                          radial and ulnar compression            +-----------+--------+-----+-----------+---------------------------------------+  Left Doppler Findings: +--------+--------+-----+-----------+--------+ Site    PressureIndexDoppler    Comments +--------+--------+-----+-----------+--------+ [id number]          triphasic           +--------+--------+-----+-----------+--------+ Radial               multiphasic         +--------+--------+-----+-----------+--------+ Ulnar                multiphasic         +--------+--------+-----+-----------+--------+   Summary: Right Carotid: The extracranial vessels were near-normal with only minimal wall                thickening or plaque. Left Carotid: The extracranial vessels were near-normal with only minimal wall               thickening or plaque. Vertebrals:  Bilateral vertebral arteries demonstrate antegrade flow. Subclavians: Normal flow hemodynamics were seen in bilateral subclavian              arteries. Right Upper Extremity: Doppler waveforms remain within normal limits with right radial compression. Doppler waveforms remain within normal limits with right ulnar compression. Left  Upper Extremity: Doppler waveform obliterate with left radial compression. Doppler waveforms remain within normal limits with left ulnar compression.  Electronically signed by Jamelle Haring on 06/26/2020 at 8:11:44 PM.    Final     Medications:   . sodium chloride   Intravenous Once  . aspirin  81 mg Oral Daily  . atorvastatin  80 mg Oral q1800  . buPROPion  75 mg Oral BID  . calcitRIOL  0.5 mcg Oral Daily  . Chlorhexidine Gluconate Cloth  6 each Topical Daily  . cinacalcet  30 mg Oral Q breakfast  . clopidogrel  75 mg Oral Daily  . [START ON 06/28/2020] darbepoetin (ARANESP) injection - NON-DIALYSIS  100 mcg Subcutaneous Q Sat-1800  . feeding supplement (NEPRO CARB STEADY)  237 mL Oral BID BM  . fidaxomicin  200 mg Oral BID  . gentamicin cream  1 application Topical Daily  . heparin  5,000 Units Subcutaneous Q8H  . metoprolol succinate  25 mg Oral Daily  . multivitamin  1 tablet Oral QHS  . pantoprazole  40 mg Oral Daily  . sodium chloride flush  3 mL Intravenous Once  . sodium chloride flush  3 mL Intravenous Q12H  . sodium chloride flush  3 mL Intravenous Q12H  . sodium chloride flush  3 mL Intravenous Q12H   Continuous Infusions: . sodium chloride    . sodium chloride    . sodium chloride 10 mL/hr at 06/27/20 0549  . levETIRAcetam 500 mg (06/27/20 0552)     LOS: 8 days   Geradine Girt  [facility]   How to contact the [facility] Attending or Consulting provider [place] or covering provider during after hours [place], for this patient?  1. Check the care team in [facility] and look for a) attending/consulting TR[facility] provider listed and b) the [facility] team listed 2. Log into [email or web address] and use [place]'s universal password to access. If you do not have the password, please contact the hospital operator. 3. Locate the [facility] provider you are looking for under [facility] and page to a number that you can be directly reached. 4. If you still have difficulty reaching the provider, please page the [facility] (Director on Call) for the Hospitalists listed on amion for assistance.  06/27/2020, 1:06 PM

## 2020-06-27 NOTE — Progress Notes (Signed)
Physical Therapy Treatment Patient Details Name: Amy [name] MRN: [id number] DOB: 03/13/1951 Today's Date: 06/27/2020    History of Present Illness Amy [name] is a 70 y.o. female with medical history significant of ESRD on PD, HTN, multiple thyroid nodules. She had a hip fracture in December s/p ORIF. Recently hospitalized 1/25/-05/29/20 for watery diarrhea, hyponatremia, hypokalemia. She was found to be covid 19 positive but was not treated with no signs of respiratory involvement. had questionable seizure activity and was transfered to [facility] from [facility]. s/p 06/25/20 Heart cath, plan for PCI to LAD on 2/25.    PT Comments    Pt reports needing to urinate upon arrival to room, requests bed pan even with PT encouragement to get to [facility]. After unsuccessful toileting on bed pan, pt agreeable to OOB mobility. Pt overall requiring min assist for bed mobility, transfers, and short distance ambulation at this time, pt limited in tolerance due to LE weakness and tachycardia to 120s. Pt requires very increased time for all mobility. PT continuing to recommend HHPT and 24/7 assist at d/c.     Follow Up Recommendations  Home health PT;Supervision/Assistance - 24 hour     Equipment Recommendations  None recommended by PT    Recommendations for Other Services       Precautions / Restrictions Precautions Precautions: Fall Precaution Comments: weakness, peritoneal diaylisis, swollen arms Restrictions Weight Bearing Restrictions: No    Mobility  Bed Mobility Overal bed mobility: Needs Assistance Bed Mobility: Supine to Sit Rolling: Supervision Sidelying to sit: Min assist;HOB elevated       General bed mobility comments: supervision for rolling bilaterally for bed pan placement and in preparation for sititng up. Min assist for trunk elevation facilitated via HHA.    Transfers Overall transfer level: Needs assistance Equipment used: Rolling walker (2 wheeled) Transfers: Sit to/from  [facility] Sit to Stand: Min assist Stand pivot transfers: Min assist       General transfer comment: Min assist for power up, rise, steady, and pivot to [facility]. STS x2, from EOB and BSC.  Ambulation/Gait Ambulation/Gait assistance: Min assist Gait Distance (Feet): 4 Feet Assistive device: Rolling walker (2 wheeled) Gait Pattern/deviations: Trunk flexed;Shuffle;Step-through pattern;Decreased stride length Gait velocity: decr   General Gait Details: Min assist to steady, verbal cuing for upright posture, placement in RW.   Stairs             Wheelchair Mobility    Modified Rankin (Stroke Patients Only)       Balance Overall balance assessment: Needs assistance Sitting-balance support: Feet supported;No upper extremity supported Sitting balance-Leahy Scale: Fair Sitting balance - Comments: able to sit EOB without PT assist   Standing balance support: Single extremity supported;Bilateral upper extremity supported Standing balance-Leahy Scale: Poor Standing balance comment: reliant on external assist                            Cognition Arousal/Alertness: Awake/alert Behavior During Therapy: WFL for tasks assessed/performed Overall Cognitive Status: Within Functional Limits for tasks assessed                                 General Comments: pt short with PT, but performs all requested mobility with encouragement      Exercises      General Comments General comments (skin integrity, edema, etc.): SpO2 WFL on RA, HR 100-122 bpm during mobility.  Momentary dizziness sitting EOB, recovered with continued sitting.      Pertinent Vitals/Pain Pain Assessment: Faces Faces Pain Scale: Hurts little more Pain Location: generalized Pain Descriptors / Indicators: Sore;Discomfort Pain Intervention(s): Limited activity within patient's tolerance;Monitored during session;Repositioned    Home Living                       Prior Function            PT Goals (current goals can now be found in the care plan section) Acute Rehab PT Goals Patient Stated Goal: home PT Goal Formulation: With patient/family Time For Goal Achievement: 07/05/20 Potential to Achieve Goals: Good Progress towards PT goals: Progressing toward goals    Frequency    Min 3X/week      PT Plan Current plan remains appropriate    Co-evaluation              AM-PAC PT "6 Clicks" Mobility   Outcome Measure  Help needed turning from your back to your side while in a flat bed without using bedrails?: A Little Help needed moving from lying on your back to sitting on the side of a flat bed without using bedrails?: A Little Help needed moving to and from a bed to a chair (including a wheelchair)?: A Little Help needed standing up from a chair using your arms (e.g., wheelchair or bedside chair)?: A Little Help needed to walk in hospital room?: A Little Help needed climbing 3-5 steps with a railing? : A Lot 6 Click Score: 17    End of Session   Activity Tolerance: Patient tolerated treatment well;Patient limited by fatigue Patient left: with call bell/phone within reach;in chair;with family/visitor present (pt verbalizes she will not get up without assist from RN staff) Nurse Communication: Mobility status PT Visit Diagnosis: Muscle weakness (generalized) (M62.81);Unsteadiness on feet (R26.81)     Time: 3570-1779 PT Time Calculation (min) (ACUTE ONLY): 32 min  Charges:  $Therapeutic Activity: 23-37 mins                     Stacie Glaze, PT Acute Rehabilitation Services Pager [phone]  Office [phone]    [name] 06/27/2020, 11:27 AM

## 2020-06-27 NOTE — Plan of Care (Signed)

## 2020-06-27 NOTE — Progress Notes (Signed)
Occupational Therapy Treatment Patient Details Name: Amy [name] MRN: [id number] DOB: 11/04/1950 Today's Date: 06/27/2020    History of present illness Amy [name] is a 70 y.o. female with medical history significant of ESRD on PD, HTN, multiple thyroid nodules. She had a hip fracture in December s/p ORIF. Recently hospitalized 1/25/-05/29/20 for watery diarrhea, hyponatremia, hypokalemia. She was found to be covid 19 positive but was not treated with no signs of respiratory involvement. had questionable seizure activity and was transfered to [facility] from [facility]. s/p 06/25/20 Heart cath, plan for PCI to LAD on 2/25.   OT comments  Pt progressing towards OT goals at this time. Pt overall min A for transfers, set up for UB ADL in seated position. Pt on RA throughout session. Pt with pending heart cath. OT will continue to follow acutely. Potentially consider WC transfer and go outside? Pt is feeling very "cooped up" and would benefit from change in environment  Follow Up Recommendations  Home health OT;Supervision/Assistance - 24 hour    Equipment Recommendations  None recommended by OT    Recommendations for Other Services      Precautions / Restrictions Precautions Precautions: Fall Precaution Comments: weakness, peritoneal diaylisis, swollen arms Restrictions Weight Bearing Restrictions: No       Mobility Bed Mobility Overal bed mobility: Needs Assistance Bed Mobility: Sit to Supine Rolling: Supervision Sidelying to sit: Min assist;HOB elevated   Sit to supine: Mod assist   General bed mobility comments: assist for BLE    Transfers Overall transfer level: Needs assistance Equipment used: 1 person hand held assist Transfers: Sit to/from Stand;Stand Pivot Transfers Sit to Stand: Min assist Stand pivot transfers: Min assist       General transfer comment: min A with face to face transfer    Balance Overall balance assessment: Needs assistance Sitting-balance support:  Feet supported;No upper extremity supported Sitting balance-Leahy Scale: Fair Sitting balance - Comments: able to sit EOB without PT assist   Standing balance support: Single extremity supported;Bilateral upper extremity supported Standing balance-Leahy Scale: Poor Standing balance comment: reliant on external assist                           ADL either performed or assessed with clinical judgement   ADL Overall ADL's : Needs assistance/impaired     Grooming: Wash/dry hands;Wash/dry face;Set up;Sitting Grooming Details (indicate cue type and reason): in recliner                 Toilet Transfer: Minimal assistance;Stand-pivot;BSC Toilet Transfer Details (indicate cue type and reason): one person face to face transfer         Functional mobility during ADLs: Minimal assistance General ADL Comments: decreased activity tolerance/balance     Vision       Perception     Praxis      Cognition Arousal/Alertness: Awake/alert Behavior During Therapy: WFL for tasks assessed/performed Overall Cognitive Status: Within Functional Limits for tasks assessed                                 General Comments: Pt is tired of hospitalization and all things associated with being "stuck" in one room. - potentially we could take her outside in [facility] next session?        Exercises     Shoulder Instructions       General Comments VSS throughout session on  RA    Pertinent Vitals/ Pain       Pain Assessment: Faces Faces Pain Scale: Hurts little more Pain Location: generalized Pain Descriptors / Indicators: Sore;Discomfort Pain Intervention(s): Limited activity within patient's tolerance;Repositioned  Home Living                                          Prior Functioning/Environment              Frequency  Min 2X/week        Progress Toward Goals  OT Goals(current goals can now be found in the care plan section)   Progress towards OT goals: Progressing toward goals  Acute Rehab OT Goals Patient Stated Goal: home OT Goal Formulation: With patient/family Time For Goal Achievement: 07/05/20 Potential to Achieve Goals: Good  Plan Discharge plan remains appropriate;Frequency remains appropriate    Co-evaluation                 AM-PAC OT "6 Clicks" Daily Activity     Outcome Measure   Help from another person eating meals?: A Little Help from another person taking care of personal grooming?: A Little Help from another person toileting, which includes using toliet, bedpan, or urinal?: A Lot Help from another person bathing (including washing, rinsing, drying)?: A Lot Help from another person to put on and taking off regular upper body clothing?: A Lot Help from another person to put on and taking off regular lower body clothing?: A Lot 6 Click Score: 14    End of Session Equipment Utilized During Treatment: Gait belt  OT Visit Diagnosis: Unsteadiness on feet (R26.81);Other abnormalities of gait and mobility (R26.89);Muscle weakness (generalized) (M62.81);Pain Pain - part of body:  (bottom)   Activity Tolerance Patient tolerated treatment well   Patient Left in bed;with call bell/phone within reach;Other (comment) (husband in room)   Nurse Communication Mobility status        Time: 2761-4709 OT Time Calculation (min): 22 min  Charges: OT General Charges $OT Visit: 1 Visit OT Treatments $Self Care/Home Management : 8-22 mins  Jesse Sans OTR/L Acute Rehabilitation Services Pager: [phone] Office: [place] 06/27/2020, 1:08 PM

## 2020-06-27 NOTE — Interval H&P Note (Signed)
Cath Lab Visit (complete for each Cath Lab visit)  Clinical Evaluation Leading to the Procedure:   ACS: Yes.    Non-ACS:    Anginal Classification: CCS IV  Anti-ischemic medical therapy: Minimal Therapy (1 class of medications)  Non-Invasive Test Results: No non-invasive testing performed  Prior CABG: No previous CABG      History and Physical Interval Note:  06/27/2020 2:02 PM  Amy [name]  has presented today for surgery, with the diagnosis of chest pain.  The various methods of treatment have been discussed with the patient and family. After consideration of risks, benefits and other options for treatment, the patient has consented to  Procedure(s): CORONARY STENT INTERVENTION (N/A) as a surgical intervention.  The patient's history has been reviewed, patient examined, no change in status, stable for surgery.  I have reviewed the patient's chart and labs.  Questions were answered to the patient's satisfaction.     Sherren Mocha

## 2020-06-27 NOTE — Progress Notes (Signed)
Nutrition Follow-up  DOCUMENTATION CODES:   Severe malnutrition in context of acute illness/injury  INTERVENTION:   - Downgrade diet to dysphagia 3 for ease of chewing and swallowing  - Ensure Enlive po TID, each supplement provides 350 kcal and 20 grams of protein  - Chocolate milk TID with meals  - Renal MVI daily  NUTRITION DIAGNOSIS:   Severe Malnutrition related to acute illness (C. difficile, recent COVID-19 infection) as evidenced by moderate fat depletion,severe muscle depletion.  Ongoing  GOAL:   Patient will meet greater than or equal to 90% of their needs  Progressing  MONITOR:   PO intake,Supplement acceptance,Labs,Weight trends,Skin,I & O's  REASON FOR ASSESSMENT:   Malnutrition Screening Tool    ASSESSMENT:   70 year old female who presented to the ED on 2/17 as a Code Stroke. PMH of ESRD on PD nightly (previously on HD), HTN, anxiety, depression, multiple thyroid nodules, right hip fracture secondary to mechanical fall in December 2021, and recent hospital admission for gastroenteritis (positive for C. diff) due to COVID-19 infection. Pt admitted with hyponatremia, hypokalemia, diarrhea, seizures.   2/23 - s/p heart cath showing severe multivessel CAD  Pt with new heart failure 2/2 ICM.  Pt is being evaluated for potential CABG. HF team consulted for preoperative management and optimization of heart failure. Per HF team note, pt would be a poor CABG candidate and best option is PCI to LAD. CVTS in agreement.  Per RN note, pt with UF of 1477 overnight with PD.  Spoke with pt and husband at bedside. Pt reports difficulty eating due to positioning in bed or recliner and due to texture of food. Pt interested in downgrading diet to mechanical chopped to aid in increasing PO intake. Pt reports that she does not like the Nepro because it is too thick and she does not like the flavors. Pt willing to try strawberry Ensure. Pt reports that she has not been  receiving chocolate milk with meal trays but pt's husband states that this is not the case because he has noticed them on her meal trays.  Pt's husband mentions pt may require a feeding tube if she does not increase PO intake. Pt states that she does not want a feeding tube ("absolutley not").  Discussed diet order with Nephrology. Okay to liberalize renal diet restrictions to increase PO intake since labs are WNL.  Meal Completion: 5-100% (multiple meal completions missing)  Medications reviewed and include: calcitriol, sensipar, aranesp, Nepro BID, dificid, rena-vit, protonix, keppra  Labs reviewed: sodium 130, sodium 8.1  Diet Order:   Diet Order            DIET DYS 3 Room service appropriate? Yes; Fluid consistency: Thin  Diet effective now                 EDUCATION NEEDS:   Education needs have been addressed  Skin:  Skin Assessment: Skin Integrity Issues: Stage I: coccyx  Last BM:  06/26/20  Height:   Ht Readings from Last 1 Encounters:  06/26/20 5\' 4"  (1.626 m)    Weight:   Wt Readings from Last 1 Encounters:  06/27/20 53 kg    BMI:  Body mass index is 20.06 kg/m.  Estimated Nutritional Needs:   Kcal:  1650-1850  Protein:  85-100 grams  Fluid:  1000 ml + UOP    Gustavus Bryant, MS, RD, LDN Inpatient Clinical Dietitian Please see AMiON for contact information.

## 2020-06-27 NOTE — Telephone Encounter (Signed)
It looks like Amy [name] contacted patient and moved appointment up to 07/02/2020 at 11:20am.

## 2020-06-27 NOTE — H&P (View-Only) (Signed)
Patient ID: Amy [name], female   DOB: 12/11/1950, 70 y.o.   MRN: [id number]     Advanced Heart Failure Rounding Note  PCP-Cardiologist: Ida Rogue, MD   Subjective:    No chest pain or dyspnea.  Worked with PT yesterday, was weak but able to get up to chair.    Objective:   Weight Range: 53 kg Body mass index is 20.06 kg/m.   Vital Signs:   Temp:  [98.2 F (36.8 C)-99 F (37.2 C)] 98.5 F (36.9 C) (02/25 0754) Pulse Rate:  [94-100] 97 (02/25 0754) Resp:  [10-20] 20 (02/25 0754) BP: (85-120)/(61-82) 95/61 (02/25 0754) SpO2:  [96 %-99 %] 97 % (02/25 0754) Weight:  [53 kg-54.2 kg] 53 kg (02/25 0600) Last BM Date: 06/26/20  Weight change: Filed Weights   06/26/20 1736 06/27/20 0530 06/27/20 0600  Weight: 54.2 kg 53 kg 53 kg    Intake/Output:   Intake/Output Summary (Last 24 hours) at 06/27/2020 0849 Last data filed at 06/27/2020 0600 Gross per 24 hour  Intake 120.43 ml  Output -  Net 120.43 ml      Physical Exam    General:  Well appearing. No resp difficulty HEENT: Normal Neck: Supple. JVP 8-9 cm. Carotids 2+ bilat; no bruits. No lymphadenopathy or thyromegaly appreciated. Cor: PMI nondisplaced. Regular rate & rhythm. No rubs, gallops or murmurs. Lungs: Clear Abdomen: Soft, nontender, nondistended. No hepatosplenomegaly. No bruits or masses. Good bowel sounds. Extremities: No cyanosis, clubbing, rash. 1+ edema 1/2 to knees bilaterally.  Neuro: Alert & orientedx3, cranial nerves grossly intact. moves all 4 extremities w/o difficulty. Affect pleasant   Telemetry   NSR 80s (personally reviewed)   Labs    CBC Recent Labs    06/26/20 0107 06/27/20 0026  WBC 10.5 8.7  NEUTROABS 8.4* 6.3  HGB 8.8* 8.1*  HCT 25.6* 24.0*  MCV 88.9 88.9  PLT 271 637   Basic Metabolic Panel Recent Labs    06/26/20 0107 06/27/20 0026  NA 129* 130*  K 4.1 3.5  CL 97* 96*  CO2 21* 23  GLUCOSE 125* 104*  BUN 30* 29*  CREATININE 4.02* 4.19*  CALCIUM 8.1* 8.4*   MG 1.8 1.8  PHOS  --  3.1   Liver Function Tests Recent Labs    06/26/20 0107 06/27/20 0026  AST 20 19  ALT 18 21  ALKPHOS 91 77  BILITOT 0.2* 0.3  PROT 3.6* 3.6*  ALBUMIN 1.6* 1.6*   No results for input(s): LIPASE, AMYLASE in the last 72 hours. Cardiac Enzymes No results for input(s): CKTOTAL, CKMB, CKMBINDEX, TROPONINI in the last 72 hours.  BNP: BNP (last 3 results) Recent Labs    06/25/20 0104 06/26/20 0107 06/27/20 0026  BNP 1,416.4* 1,112.0* 1,371.7*    ProBNP (last 3 results) No results for input(s): PROBNP in the last 8760 hours.   D-Dimer Recent Labs    06/26/20 0107 06/27/20 0026  DDIMER 2.74* 2.90*   Hemoglobin A1C No results for input(s): HGBA1C in the last 72 hours. Fasting Lipid Panel No results for input(s): CHOL, HDL, LDLCALC, TRIG, CHOLHDL, LDLDIRECT in the last 72 hours. Thyroid Function Tests No results for input(s): TSH, T4TOTAL, T3FREE, THYROIDAB in the last 72 hours.  Invalid input(s): FREET3  Other results:   Imaging    VAS US DOPPLER PRE CABG  Result Date: 06/26/2020 PREOPERATIVE VASCULAR EVALUATION  Indications:      Pre-CABG. Risk Factors:     Hypertension, hyperlipidemia, coronary artery disease. Comparison Study: No prior  study Performing Technologist: Sharion Dove RVS  Examination Guidelines: A complete evaluation includes B-mode imaging, spectral Doppler, color Doppler, and power Doppler as needed of all accessible portions of each vessel. Bilateral testing is considered an integral part of a complete examination. Limited examinations for reoccurring indications may be performed as noted.  Right Carotid Findings: +----------+--------+--------+--------+------------+------------------+           PSV cm/sEDV cm/sStenosisDescribe    Comments           +----------+--------+--------+--------+------------+------------------+ CCA Prox  79      25                          intimal thickening  +----------+--------+--------+--------+------------+------------------+ CCA Distal62      25                          intimal thickening +----------+--------+--------+--------+------------+------------------+ ICA Prox  63      17              heterogenous                   +----------+--------+--------+--------+------------+------------------+ ICA Distal77      31                                             +----------+--------+--------+--------+------------+------------------+ ECA       48      11                                             +----------+--------+--------+--------+------------+------------------+ Portions of this table do not appear on this page. +----------+--------+-------+--------+------------+           PSV cm/sEDV cmsDescribeArm Pressure +----------+--------+-------+--------+------------+ Subclavian59                                  +----------+--------+-------+--------+------------+ +---------+--------+--+--------+--+ VertebralPSV cm/s70EDV cm/s22 +---------+--------+--+--------+--+ Left Carotid Findings: +----------+--------+--------+--------+------------+------------------+           PSV cm/sEDV cm/sStenosisDescribe    Comments           +----------+--------+--------+--------+------------+------------------+ CCA Prox  72      20                          intimal thickening +----------+--------+--------+--------+------------+------------------+ CCA Distal53      18                          intimal thickening +----------+--------+--------+--------+------------+------------------+ ICA Prox  61      30              heterogenous                   +----------+--------+--------+--------+------------+------------------+ ICA Distal83      34                                             +----------+--------+--------+--------+------------+------------------+ ECA       46      10                                              +----------+--------+--------+--------+------------+------------------+ +----------+--------+--------+--------+------------+  SubclavianPSV cm/sEDV cm/sDescribeArm Pressure +----------+--------+--------+--------+------------+           83                                   +----------+--------+--------+--------+------------+ +---------+--------+--+--------+--+ VertebralPSV cm/s48EDV cm/s15 +---------+--------+--+--------+--+  ABI Findings: +--------+------------------+-----+-----------+--------+ Right   Rt Pressure (mmHg)IndexWaveform   Comment  +--------+------------------+-----+-----------+--------+ [id number]                    triphasic           +--------+------------------+-----+-----------+--------+ PTA                            multiphasic         +--------+------------------+-----+-----------+--------+ DP                             multiphasic         +--------+------------------+-----+-----------+--------+ +--------+------------------+-----+-----------+-------+ Left    Lt Pressure (mmHg)IndexWaveform   Comment +--------+------------------+-----+-----------+-------+ [id number]                    triphasic          +--------+------------------+-----+-----------+-------+ PTA                            multiphasic        +--------+------------------+-----+-----------+-------+ DP                             multiphasic        +--------+------------------+-----+-----------+-------+  Right Doppler Findings: +-----------+--------+-----+-----------+---------------------------------------+ Site       PressureIndexDoppler    Comments                                +-----------+--------+-----+-----------+---------------------------------------+ Brachial   117          triphasic                                          +-----------+--------+-----+-----------+---------------------------------------+ Radial                   multiphasic                                        +-----------+--------+-----+-----------+---------------------------------------+ Ulnar                   multiphasic                                        +-----------+--------+-----+-----------+---------------------------------------+ Palmar Arch                        Doppler signal reverses with both                                          radial and ulnar compression            +-----------+--------+-----+-----------+---------------------------------------+  Left Doppler Findings: +--------+--------+-----+-----------+--------+ Site    PressureIndexDoppler    Comments +--------+--------+-----+-----------+--------+ [id number]          triphasic           +--------+--------+-----+-----------+--------+ Radial               multiphasic         +--------+--------+-----+-----------+--------+ Ulnar                multiphasic         +--------+--------+-----+-----------+--------+  Summary: Right Carotid: The extracranial vessels were near-normal with only minimal wall                thickening or plaque. Left Carotid: The extracranial vessels were near-normal with only minimal wall               thickening or plaque. Vertebrals:  Bilateral vertebral arteries demonstrate antegrade flow. Subclavians: Normal flow hemodynamics were seen in bilateral subclavian              arteries. Right Upper Extremity: Doppler waveforms remain within normal limits with right radial compression. Doppler waveforms remain within normal limits with right ulnar compression. Left Upper Extremity: Doppler waveform obliterate with left radial compression. Doppler waveforms remain within normal limits with left ulnar compression.  Electronically signed by Jamelle Haring on 06/26/2020 at 8:11:44 PM.    Final       Medications:     Scheduled Medications: . sodium chloride   Intravenous Once  . aspirin  81 mg Oral Daily  . atorvastatin  80 mg  Oral q1800  . buPROPion  75 mg Oral BID  . calcitRIOL  0.5 mcg Oral Daily  . Chlorhexidine Gluconate Cloth  6 each Topical Daily  . cinacalcet  30 mg Oral Q breakfast  . clopidogrel  75 mg Oral Daily  . [START ON 06/28/2020] darbepoetin (ARANESP) injection - NON-DIALYSIS  100 mcg Subcutaneous Q Sat-1800  . feeding supplement (NEPRO CARB STEADY)  237 mL Oral BID BM  . fidaxomicin  200 mg Oral BID  . gentamicin cream  1 application Topical Daily  . heparin  5,000 Units Subcutaneous Q8H  . metoprolol succinate  25 mg Oral Daily  . multivitamin  1 tablet Oral QHS  . pantoprazole  40 mg Oral Daily  . sodium chloride flush  3 mL Intravenous Once  . sodium chloride flush  3 mL Intravenous Q12H  . sodium chloride flush  3 mL Intravenous Q12H  . sodium chloride flush  3 mL Intravenous Q12H     Infusions: . sodium chloride    . sodium chloride    . sodium chloride 10 mL/hr at 06/27/20 0549  . levETIRAcetam 500 mg (06/27/20 0552)     PRN Medications:  sodium chloride, sodium chloride, acetaminophen, butalbital-acetaminophen-caffeine, diazepam, Gerhardt's butt cream, dianeal solution for CAPD/[name] with heparin, ondansetron (ZOFRAN) IV, sodium chloride flush, sodium chloride flush, traZODone   Assessment/Plan   1. Seizure: Cause of initial presentation.  Thought to be due to electrolyte abnormalities due to C difficile diarrhea. EEG showed post-ictal state, was seen by neurology.  - She is on Keppra per primary service.  2. C difficile diarrhea: Now on fidaxomicin, failed course of oral vancomycin.  3. Recent COVID-19 infection.  4. ESRD: She does PD.  5. CAD: NSTEMI with peak HS-TnI 1978.  Cath with serial proximal-mid LAD severe stenoses, 85% proximal ramus stenosis.  She was evaluated by [facility] and discussed with Dr. Orvan Seen.  I think that with  her current comorbidities and deconditioning, she would be a poor CABG candidate. Unable to do MRI viability study given ESRD on PD.  - Plan for  PCI to LAD today with Dr. Burt Knack.  - Continue ASA 81 and Plavix (loaded yesterday).  - Continue high dose statin.  6. Acute on chronic systolic CHF: Echo with EF 25-30%, LAD territory wall motion abnormalities.  Mild volume overload on exam. Denies dyspnea.  - Volume managed by nephrology with PD and periodic Lasix boluses.  - Continue beta blocker, will transition from metoprolol tartrate to Toprol Xl 25 mg daily.  - SBP 90s-100s, will not add additional HF medications at this time.   Length of Stay: 8  Loralie Champagne, MD  06/27/2020, 8:49 AM  Advanced Heart Failure Team Pager [phone] (M-F; 7a - 4p)  Please contact [facility] for night-coverage after hours (4p -7a ) and weekends on [email or web address]

## 2020-06-27 NOTE — Progress Notes (Signed)
HD RN arrived to pt room to disconnect pt from PD machine, Pt AAOx4, Denies pain.  UF removal 1477

## 2020-06-27 NOTE — Progress Notes (Signed)
Nephrology Follow-Up Consult note   Assessment/Recommendations: Amy [name] is a/an 70 y.o. female with a past medical history significant for ESRD on PD admitted with multiple electrolyte derangements, diarrhea, C. difficile, possible seizure.  Now found to have multivessel CAD   # ESRD:  - Continue PD - mix 1.5 and 2.5% dextrose tonight.  Lasix 80 mg IV once as well   # Volume/ hypertension:   - Controlled with PD as above  # Multivessel CAD - per cardiology and CT surgery.    # Heart failure reduced EF - saline lock IV. Gentle UF as above  # Anemia of Chronic Kidney Disease:  on ESA - increase to aranesp 100 mcg every sat (for the 2/26 dose)  # Secondary Hyperparathyroidism/Hyperphosphatemia: Continue calcitriol 0.5 MCG daily and Sensipar 30 mg daily.   #Hyponatremia: on PD as above.  #Hypokalemia: potassium 10 meq once today   #Acidosis: Mild metabolic acidosis likely related to diarrhea. stopped oral bicarbonate   #Possible seizure: Receiving Keppra.    Neurology has been consulted   ________________________________________________________   Interval History/Subjective:  She states she thinks she's getting a heart stent today.  Just got taken off PD.  States PD went ok last night.  Strict ins/outs not available.  Does make some urine.   2/24 - 850 mL UF 2/23 - 627 mL UF 2/22 am had 483 ml UF 2/21 am had 2.9 liters UF   Review of systems:    Denies shortness of breath or chest pain  Denies n/v  Medications:  Current Facility-Administered Medications  Medication Dose Route Frequency Provider Last Rate Last Admin  . 0.9 %  sodium chloride infusion (Manually program via Guardrails IV Fluids)   Intravenous Once Thurnell Lose, MD   Held at 06/22/20 [phone]  . 0.9 %  sodium chloride infusion  250 mL Intravenous PRN Troy Sine, MD      . 0.9 %  sodium chloride infusion  250 mL Intravenous PRN Lyda Jester M, PA-C      . 0.9 %  sodium chloride infusion    Intravenous Continuous Consuelo Pandy, PA-C 10 mL/hr at 06/27/20 0549 New Bag at 06/27/20 0549  . acetaminophen (TYLENOL) tablet 650 mg  650 mg Oral Q4H PRN Troy Sine, MD      . aspirin chewable tablet 81 mg  81 mg Oral Daily Troy Sine, MD   81 mg at 06/26/20 1038  . atorvastatin (LIPITOR) tablet 80 mg  80 mg Oral q1800 Troy Sine, MD   80 mg at 06/26/20 1608  . buPROPion [facility]) tablet 75 mg  75 mg Oral BID Eulogio Bear U, DO   75 mg at 06/26/20 2128  . butalbital-acetaminophen-caffeine (FIORICET) 50-325-40 MG per tablet 1 tablet  1 tablet Oral Q6H PRN Skeet Latch, MD   1 tablet at 06/26/20 1054  . calcitRIOL (ROCALTROL) capsule 0.5 mcg  0.5 mcg Oral Daily Reesa Chew, MD   0.5 mcg at 06/26/20 1038  . Chlorhexidine Gluconate Cloth 2 % PADS 6 each  6 each Topical Daily Norins, Heinz Knuckles, MD   6 each at 06/24/20 1043  . cinacalcet (SENSIPAR) tablet 30 mg  30 mg Oral Q breakfast Reesa Chew, MD   30 mg at 06/26/20 1038  . clopidogrel (PLAVIX) tablet 75 mg  75 mg Oral Daily Simmons, Brittainy M, PA-C      . [START ON 06/28/2020] Darbepoetin Alfa (ARANESP) injection 100 mcg  100 mcg Subcutaneous  Q Sat-1800 Harrie Jeans C, MD      . diazepam (VALIUM) tablet 5 mg  5 mg Oral Q6H PRN Troy Sine, MD      . feeding supplement (NEPRO CARB STEADY) liquid 237 mL  237 mL Oral BID BM Thurnell Lose, MD   237 mL at 06/26/20 1451  . fidaxomicin (DIFICID) tablet 200 mg  200 mg Oral BID Neena Rhymes, MD   200 mg at 06/26/20 2128  . gentamicin cream (GARAMYCIN) 0.1 % 1 application  1 application Topical Daily Reesa Chew, MD   1 application at 41/66/06 1043  . Gerhardt's butt cream   Topical PRN Thurnell Lose, MD   Given at 06/22/20 1616  . heparin 2,500 Units in dialysis solution 2.5% low-MG/low-CA 5,000 mL dialysis solution   Peritoneal Dialysis PRN Reesa Chew, MD      . heparin injection 5,000 Units  5,000 Units Subcutaneous Q8H Einar Grad, Gustavus   5,000 Units at 06/27/20 3016  . levETIRAcetam (KEPPRA) IVPB 500 mg/100 mL premix  500 mg Intravenous Q12H Perfecto Kingdom, MD 400 mL/hr at 06/27/20 0552 500 mg at 06/27/20 0552  . metoprolol tartrate (LOPRESSOR) tablet 25 mg  25 mg Oral BID Thurnell Lose, MD   25 mg at 06/26/20 1038  . multivitamin (RENA-VIT) tablet 1 tablet  1 tablet Oral QHS Thurnell Lose, MD   1 tablet at 06/26/20 2128  . ondansetron (ZOFRAN) injection 4 mg  4 mg Intravenous Q6H PRN Troy Sine, MD   4 mg at 06/25/20 2341  . pantoprazole (PROTONIX) EC tablet 40 mg  40 mg Oral Daily Vann, Jessica U, DO   40 mg at 06/26/20 1038  . sodium chloride flush (NS) 0.9 % injection 3 mL  3 mL Intravenous Once Carmin Muskrat, MD      . sodium chloride flush (NS) 0.9 % injection 3 mL  3 mL Intravenous Q12H Kathyrn Drown D, NP   3 mL at 06/26/20 1042  . sodium chloride flush (NS) 0.9 % injection 3 mL  3 mL Intravenous Q12H Troy Sine, MD   3 mL at 06/26/20 1042  . sodium chloride flush (NS) 0.9 % injection 3 mL  3 mL Intravenous PRN Troy Sine, MD      . sodium chloride flush (NS) 0.9 % injection 3 mL  3 mL Intravenous Q12H Lyda Jester M, PA-C   3 mL at 06/26/20 2137  . sodium chloride flush (NS) 0.9 % injection 3 mL  3 mL Intravenous PRN Rosita Fire, Brittainy M, PA-C      . traZODone (DESYREL) tablet 25 mg  25 mg Oral QHS PRN Neena Rhymes, MD   25 mg at 06/26/20 2128      Physical Exam: Vitals:   06/26/20 2345 06/27/20 0314  BP: (!) 85/62 103/62  Pulse: 99 95  Resp: 16 16  Temp: 98.5 F (36.9 C) 98.5 F (36.9 C)  SpO2: 98% 99%   Total I/O In: 120 [P.O.:120] Out: -   Intake/Output Summary (Last 24 hours) at 06/27/2020 0109 Last data filed at 06/26/2020 1933 Gross per 24 hour  Intake 120 ml  Output --  Net 120 ml    General adult female in bed in no acute distress HEENT normocephalic atraumatic extraocular movements intact sclera anicteric Neck supple trachea midline Lungs  clear to auscultation bilaterally normal work of breathing at rest  Heart S1s2 no rub Abdomen soft nontender nondistended  Extremities 1+ edema  Psych normal mood and affect Neuro alert and oriented x 3 provides hx and follows commands Access: tenckhoff dressing intact, clean  Test Results I personally reviewed new and old clinical labs and radiology tests Lab Results  Component Value Date   NA 130 (L) 06/27/2020   K 3.5 06/27/2020   CL 96 (L) 06/27/2020   CO2 23 06/27/2020   BUN 29 (H) 06/27/2020   CREATININE 4.19 (H) 06/27/2020   GLU 103 04/30/2014   CALCIUM 8.4 (L) 06/27/2020   ALBUMIN 1.6 (L) 06/27/2020   PHOS 3.1 06/27/2020     Outpt prescription Access  PD Catheter Right lower quadrant   Schedule  QD   PD Type  PD with Cycler   Vendor  [id number]   Calcium  2.50 mEq   Magnesium  0.5 mEq   Dextrose  Varied   Dextrose Instructions  Dextrose strength dependant on BP, weight, and edema.   Total Fills: Dry (Checked)  4   Total Fills: Dry (Checked) Details  4 Fills per Cycle @ 2000 ml   Cycle  Day   Cycle Drain Time  20 min   Cycle Fill Time  10 min   Cycle Dwell Time  1 hrs 49 min        Claudia Desanctis, MD 06/27/2020 6:24 AM

## 2020-06-27 NOTE — Progress Notes (Signed)
Patient ID: Amy [name], female   DOB: July 13, 1950, 70 y.o.   MRN: [id number]     Advanced Heart Failure Rounding Note  PCP-Cardiologist: Ida Rogue, MD   Subjective:    No chest pain or dyspnea.  Worked with PT yesterday, was weak but able to get up to chair.    Objective:   Weight Range: 53 kg Body mass index is 20.06 kg/m.   Vital Signs:   Temp:  [98.2 F (36.8 C)-99 F (37.2 C)] 98.5 F (36.9 C) (02/25 0754) Pulse Rate:  [94-100] 97 (02/25 0754) Resp:  [10-20] 20 (02/25 0754) BP: (85-120)/(61-82) 95/61 (02/25 0754) SpO2:  [96 %-99 %] 97 % (02/25 0754) Weight:  [53 kg-54.2 kg] 53 kg (02/25 0600) Last BM Date: 06/26/20  Weight change: Filed Weights   06/26/20 1736 06/27/20 0530 06/27/20 0600  Weight: 54.2 kg 53 kg 53 kg    Intake/Output:   Intake/Output Summary (Last 24 hours) at 06/27/2020 0849 Last data filed at 06/27/2020 0600 Gross per 24 hour  Intake 120.43 ml  Output --  Net 120.43 ml      Physical Exam    General:  Well appearing. No resp difficulty HEENT: Normal Neck: Supple. JVP 8-9 cm. Carotids 2+ bilat; no bruits. No lymphadenopathy or thyromegaly appreciated. Cor: PMI nondisplaced. Regular rate & rhythm. No rubs, gallops or murmurs. Lungs: Clear Abdomen: Soft, nontender, nondistended. No hepatosplenomegaly. No bruits or masses. Good bowel sounds. Extremities: No cyanosis, clubbing, rash. 1+ edema 1/2 to knees bilaterally.  Neuro: Alert & orientedx3, cranial nerves grossly intact. moves all 4 extremities w/o difficulty. Affect pleasant   Telemetry   NSR 80s (personally reviewed)   Labs    CBC Recent Labs    06/26/20 0107 06/27/20 0026  WBC 10.5 8.7  NEUTROABS 8.4* 6.3  HGB 8.8* 8.1*  HCT 25.6* 24.0*  MCV 88.9 88.9  PLT 271 623   Basic Metabolic Panel Recent Labs    06/26/20 0107 06/27/20 0026  NA 129* 130*  K 4.1 3.5  CL 97* 96*  CO2 21* 23  GLUCOSE 125* 104*  BUN 30* 29*  CREATININE 4.02* 4.19*  CALCIUM 8.1* 8.4*   MG 1.8 1.8  PHOS  --  3.1   Liver Function Tests Recent Labs    06/26/20 0107 06/27/20 0026  AST 20 19  ALT 18 21  ALKPHOS 91 77  BILITOT 0.2* 0.3  PROT 3.6* 3.6*  ALBUMIN 1.6* 1.6*   No results for input(s): LIPASE, AMYLASE in the last 72 hours. Cardiac Enzymes No results for input(s): CKTOTAL, CKMB, CKMBINDEX, TROPONINI in the last 72 hours.  BNP: BNP (last 3 results) Recent Labs    06/25/20 0104 06/26/20 0107 06/27/20 0026  BNP 1,416.4* 1,112.0* 1,371.7*    ProBNP (last 3 results) No results for input(s): PROBNP in the last 8760 hours.   D-Dimer Recent Labs    06/26/20 0107 06/27/20 0026  DDIMER 2.74* 2.90*   Hemoglobin A1C No results for input(s): HGBA1C in the last 72 hours. Fasting Lipid Panel No results for input(s): CHOL, HDL, LDLCALC, TRIG, CHOLHDL, LDLDIRECT in the last 72 hours. Thyroid Function Tests No results for input(s): TSH, T4TOTAL, T3FREE, THYROIDAB in the last 72 hours.  Invalid input(s): FREET3  Other results:   Imaging    VAS US DOPPLER PRE CABG  Result Date: 06/26/2020 PREOPERATIVE VASCULAR EVALUATION  Indications:      Pre-CABG. Risk Factors:     Hypertension, hyperlipidemia, coronary artery disease. Comparison Study: No prior  study Performing Technologist: Sharion Dove RVS  Examination Guidelines: A complete evaluation includes B-mode imaging, spectral Doppler, color Doppler, and power Doppler as needed of all accessible portions of each vessel. Bilateral testing is considered an integral part of a complete examination. Limited examinations for reoccurring indications may be performed as noted.  Right Carotid Findings: +----------+--------+--------+--------+------------+------------------+           PSV cm/sEDV cm/sStenosisDescribe    Comments           +----------+--------+--------+--------+------------+------------------+ CCA Prox  79      25                          intimal thickening  +----------+--------+--------+--------+------------+------------------+ CCA Distal62      25                          intimal thickening +----------+--------+--------+--------+------------+------------------+ ICA Prox  63      17              heterogenous                   +----------+--------+--------+--------+------------+------------------+ ICA Distal77      31                                             +----------+--------+--------+--------+------------+------------------+ ECA       48      11                                             +----------+--------+--------+--------+------------+------------------+ Portions of this table do not appear on this page. +----------+--------+-------+--------+------------+           PSV cm/sEDV cmsDescribeArm Pressure +----------+--------+-------+--------+------------+ Subclavian59                                  +----------+--------+-------+--------+------------+ +---------+--------+--+--------+--+ VertebralPSV cm/s70EDV cm/s22 +---------+--------+--+--------+--+ Left Carotid Findings: +----------+--------+--------+--------+------------+------------------+           PSV cm/sEDV cm/sStenosisDescribe    Comments           +----------+--------+--------+--------+------------+------------------+ CCA Prox  72      20                          intimal thickening +----------+--------+--------+--------+------------+------------------+ CCA Distal53      18                          intimal thickening +----------+--------+--------+--------+------------+------------------+ ICA Prox  61      30              heterogenous                   +----------+--------+--------+--------+------------+------------------+ ICA Distal83      34                                             +----------+--------+--------+--------+------------+------------------+ ECA       46      10                                              +----------+--------+--------+--------+------------+------------------+ +----------+--------+--------+--------+------------+  SubclavianPSV cm/sEDV cm/sDescribeArm Pressure +----------+--------+--------+--------+------------+           83                                   +----------+--------+--------+--------+------------+ +---------+--------+--+--------+--+ VertebralPSV cm/s48EDV cm/s15 +---------+--------+--+--------+--+  ABI Findings: +--------+------------------+-----+-----------+--------+ Right   Rt Pressure (mmHg)IndexWaveform   Comment  +--------+------------------+-----+-----------+--------+ [id number]                    triphasic           +--------+------------------+-----+-----------+--------+ PTA                            multiphasic         +--------+------------------+-----+-----------+--------+ DP                             multiphasic         +--------+------------------+-----+-----------+--------+ +--------+------------------+-----+-----------+-------+ Left    Lt Pressure (mmHg)IndexWaveform   Comment +--------+------------------+-----+-----------+-------+ [id number]                    triphasic          +--------+------------------+-----+-----------+-------+ PTA                            multiphasic        +--------+------------------+-----+-----------+-------+ DP                             multiphasic        +--------+------------------+-----+-----------+-------+  Right Doppler Findings: +-----------+--------+-----+-----------+---------------------------------------+ Site       PressureIndexDoppler    Comments                                +-----------+--------+-----+-----------+---------------------------------------+ Brachial   117          triphasic                                          +-----------+--------+-----+-----------+---------------------------------------+ Radial                   multiphasic                                        +-----------+--------+-----+-----------+---------------------------------------+ Ulnar                   multiphasic                                        +-----------+--------+-----+-----------+---------------------------------------+ Palmar Arch                        Doppler signal reverses with both                                          radial and ulnar compression            +-----------+--------+-----+-----------+---------------------------------------+  Left Doppler Findings: +--------+--------+-----+-----------+--------+ Site    PressureIndexDoppler    Comments +--------+--------+-----+-----------+--------+ [id number]          triphasic           +--------+--------+-----+-----------+--------+ Radial               multiphasic         +--------+--------+-----+-----------+--------+ Ulnar                multiphasic         +--------+--------+-----+-----------+--------+  Summary: Right Carotid: The extracranial vessels were near-normal with only minimal wall                thickening or plaque. Left Carotid: The extracranial vessels were near-normal with only minimal wall               thickening or plaque. Vertebrals:  Bilateral vertebral arteries demonstrate antegrade flow. Subclavians: Normal flow hemodynamics were seen in bilateral subclavian              arteries. Right Upper Extremity: Doppler waveforms remain within normal limits with right radial compression. Doppler waveforms remain within normal limits with right ulnar compression. Left Upper Extremity: Doppler waveform obliterate with left radial compression. Doppler waveforms remain within normal limits with left ulnar compression.  Electronically signed by Jamelle Haring on 06/26/2020 at 8:11:44 PM.    Final       Medications:     Scheduled Medications: . sodium chloride   Intravenous Once  . aspirin  81 mg Oral Daily  . atorvastatin  80 mg  Oral q1800  . buPROPion  75 mg Oral BID  . calcitRIOL  0.5 mcg Oral Daily  . Chlorhexidine Gluconate Cloth  6 each Topical Daily  . cinacalcet  30 mg Oral Q breakfast  . clopidogrel  75 mg Oral Daily  . [START ON 06/28/2020] darbepoetin (ARANESP) injection - NON-DIALYSIS  100 mcg Subcutaneous Q Sat-1800  . feeding supplement (NEPRO CARB STEADY)  237 mL Oral BID BM  . fidaxomicin  200 mg Oral BID  . gentamicin cream  1 application Topical Daily  . heparin  5,000 Units Subcutaneous Q8H  . metoprolol succinate  25 mg Oral Daily  . multivitamin  1 tablet Oral QHS  . pantoprazole  40 mg Oral Daily  . sodium chloride flush  3 mL Intravenous Once  . sodium chloride flush  3 mL Intravenous Q12H  . sodium chloride flush  3 mL Intravenous Q12H  . sodium chloride flush  3 mL Intravenous Q12H     Infusions: . sodium chloride    . sodium chloride    . sodium chloride 10 mL/hr at 06/27/20 0549  . levETIRAcetam 500 mg (06/27/20 0552)     PRN Medications:  sodium chloride, sodium chloride, acetaminophen, butalbital-acetaminophen-caffeine, diazepam, Gerhardt's butt cream, dianeal solution for CAPD/[name] with heparin, ondansetron (ZOFRAN) IV, sodium chloride flush, sodium chloride flush, traZODone   Assessment/Plan   1. Seizure: Cause of initial presentation.  Thought to be due to electrolyte abnormalities due to C difficile diarrhea. EEG showed post-ictal state, was seen by neurology.  - She is on Keppra per primary service.  2. C difficile diarrhea: Now on fidaxomicin, failed course of oral vancomycin.  3. Recent COVID-19 infection.  4. ESRD: She does PD.  5. CAD: NSTEMI with peak HS-TnI 1978.  Cath with serial proximal-mid LAD severe stenoses, 85% proximal ramus stenosis.  She was evaluated by [facility] and discussed with Dr. Orvan Seen.  I think that with  her current comorbidities and deconditioning, she would be a poor CABG candidate. Unable to do MRI viability study given ESRD on PD.  - Plan for  PCI to LAD today with Dr. Burt Knack.  - Continue ASA 81 and Plavix (loaded yesterday).  - Continue high dose statin.  6. Acute on chronic systolic CHF: Echo with EF 25-30%, LAD territory wall motion abnormalities.  Mild volume overload on exam. Denies dyspnea.  - Volume managed by nephrology with PD and periodic Lasix boluses.  - Continue beta blocker, will transition from metoprolol tartrate to Toprol Xl 25 mg daily.  - SBP 90s-100s, will not add additional HF medications at this time.   Length of Stay: 8  Loralie Champagne, MD  06/27/2020, 8:49 AM  Advanced Heart Failure Team Pager [phone] (M-F; 7a - 4p)  Please contact [facility] for night-coverage after hours (4p -7a ) and weekends on [email or web address]

## 2020-06-27 NOTE — Progress Notes (Signed)
Pt with hematoma from cath earlier today. This RN removed 1 cc from tr band and hematoma got slightly bigger. This RN applied pressure and hematoma softened. Will continue to monitor.

## 2020-06-28 ENCOUNTER — Inpatient Hospital Stay (HOSPITAL_COMMUNITY): Payer: Medicare Other

## 2020-06-28 DIAGNOSIS — N186 End stage renal disease: Secondary | ICD-10-CM | POA: Diagnosis not present

## 2020-06-28 DIAGNOSIS — T148XXA Other injury of unspecified body region, initial encounter: Secondary | ICD-10-CM

## 2020-06-28 DIAGNOSIS — Z992 Dependence on renal dialysis: Secondary | ICD-10-CM | POA: Diagnosis not present

## 2020-06-28 DIAGNOSIS — I214 Non-ST elevation (NSTEMI) myocardial infarction: Secondary | ICD-10-CM | POA: Diagnosis not present

## 2020-06-28 DIAGNOSIS — E871 Hypo-osmolality and hyponatremia: Secondary | ICD-10-CM | POA: Diagnosis not present

## 2020-06-28 DIAGNOSIS — R569 Unspecified convulsions: Secondary | ICD-10-CM | POA: Diagnosis not present

## 2020-06-28 DIAGNOSIS — A0472 Enterocolitis due to Clostridium difficile, not specified as recurrent: Secondary | ICD-10-CM | POA: Diagnosis not present

## 2020-06-28 LAB — CBC
HCT: 22.3 % — ABNORMAL LOW (ref 36.0–46.0)
Hemoglobin: 7.5 g/dL — ABNORMAL LOW (ref 12.0–15.0)
MCH: 30.4 pg (ref 26.0–34.0)
MCHC: 33.6 g/dL (ref 30.0–36.0)
MCV: 90.3 fL (ref 80.0–100.0)
Platelets: 238 10*3/uL (ref 150–400)
RBC: 2.47 MIL/uL — ABNORMAL LOW (ref 3.87–5.11)
RDW: 18.6 % — ABNORMAL HIGH (ref 11.5–15.5)
WBC: 8.8 10*3/uL (ref 4.0–10.5)
nRBC: 0 % (ref 0.0–0.2)

## 2020-06-28 LAB — BASIC METABOLIC PANEL
Anion gap: 10 (ref 5–15)
BUN: 32 mg/dL — ABNORMAL HIGH (ref 8–23)
CO2: 22 mmol/L (ref 22–32)
Calcium: 8.2 mg/dL — ABNORMAL LOW (ref 8.9–10.3)
Chloride: 98 mmol/L (ref 98–111)
Creatinine, Ser: 4.63 mg/dL — ABNORMAL HIGH (ref 0.44–1.00)
GFR, Estimated: 10 mL/min — ABNORMAL LOW (ref >60)
Glucose, Bld: 108 mg/dL — ABNORMAL HIGH (ref 70–99)
Potassium: 4 mmol/L (ref 3.5–5.1)
Sodium: 130 mmol/L — ABNORMAL LOW (ref 135–145)

## 2020-06-28 MED ORDER — FUROSEMIDE 10 MG/ML IJ SOLN
80.0000 mg | Freq: Every day | INTRAMUSCULAR | Status: DC
  Administered 2020-06-28 – 2020-06-30 (×3): 80 mg via INTRAVENOUS
  Filled 2020-06-28 (×3): qty 8

## 2020-06-28 MED ORDER — BUTALBITAL-APAP-CAFFEINE 50-325-40 MG PO TABS
1.0000 | ORAL_TABLET | Freq: Four times a day (QID) | ORAL | Status: DC | PRN
  Administered 2020-06-28: 1 via ORAL
  Administered 2020-06-28 – 2020-06-29 (×3): 2 via ORAL
  Administered 2020-06-30: 1 via ORAL
  Administered 2020-06-30 – 2020-07-04 (×11): 2 via ORAL
  Filled 2020-06-28 (×3): qty 2
  Filled 2020-06-28: qty 1
  Filled 2020-06-28 (×4): qty 2
  Filled 2020-06-28: qty 1
  Filled 2020-06-28 (×8): qty 2

## 2020-06-28 MED ORDER — POTASSIUM CHLORIDE CRYS ER 10 MEQ PO TBCR
10.0000 meq | EXTENDED_RELEASE_TABLET | Freq: Every day | ORAL | Status: DC
  Administered 2020-06-28 – 2020-07-04 (×7): 10 meq via ORAL
  Filled 2020-06-28 (×7): qty 1

## 2020-06-28 NOTE — Plan of Care (Signed)

## 2020-06-28 NOTE — Progress Notes (Signed)
Nephrology Follow-Up Consult note   Assessment/Recommendations: Amy [name] is a/an 70 y.o. female with a past medical history significant for ESRD on PD admitted with multiple electrolyte derangements, diarrhea, C. difficile, possible seizure.  Now found to have multivessel CAD   # ESRD:  - Continue PD - mix 1.5 and 2.5% dextrose tonight.  Add lasix 80 mg PO daily as below   # Volume/ hypertension:   - Controlled with PD as above  # Multivessel CAD - per cardiology and CT surgery.    # Heart failure reduced EF - saline lock IV. Gentle UF as above. Add lasix 80 mg PO daily   # Anemia of Chronic Kidney Disease:  on ESA - increased to aranesp 100 mcg every sat (for the 2/26 dose)  # Secondary Hyperparathyroidism/Hyperphosphatemia: Continue calcitriol 0.5 MCG daily and Sensipar 30 mg daily.   #Hyponatremia: on PD as above.  #Hypokalemia: potassium 10 meq once daily   #Acidosis: Mild metabolic acidosis likely related to diarrhea. stopped oral bicarbonate   #Possible seizure: Receiving Keppra.    Neurology has been consulted   Disposition per primary team.  Once discharged I have asked her to set up follow-up with labs with home training unit for PD within a week   ________________________________________________________   Interval History/Subjective:  no urine output charted for 2/25.  She's not sure of plans for discharge.  States got a stent yesterday.. she's ok with a 1.5 and 2.5% mix tonight.    2/25 - UF 1477 2/24 - 850 mL UF 2/23 - 627 mL UF 2/22 am had 483 ml UF 2/21 am had 2.9 liters UF   Review of systems:    Denies shortness of breath or chest pain  Denies n/v No diarrhea  Medications:  Current Facility-Administered Medications  Medication Dose Route Frequency Provider Last Rate Last Admin  . 0.9 %  sodium chloride infusion (Manually program via Guardrails IV Fluids)   Intravenous Once Thurnell Lose, MD   Held at 06/22/20 [phone]  . 0.9 %  sodium chloride  infusion  250 mL Intravenous PRN Troy Sine, MD 888 mL/hr at 06/27/20 1417 250 mL at 06/27/20 1417  . 0.9 %  sodium chloride infusion  250 mL Intravenous PRN Sherren Mocha, MD      . acetaminophen (TYLENOL) tablet 650 mg  650 mg Oral Q4H PRN Eulogio Bear U, DO   650 mg at 06/27/20 2050  . aspirin chewable tablet 81 mg  81 mg Oral Daily Troy Sine, MD   81 mg at 06/26/20 1038  . atorvastatin (LIPITOR) tablet 80 mg  80 mg Oral q1800 Troy Sine, MD   80 mg at 06/27/20 1732  . buPROPion [facility]) tablet 75 mg  75 mg Oral BID Eulogio Bear U, DO   75 mg at 06/27/20 2052  . butalbital-acetaminophen-caffeine (FIORICET) 50-325-40 MG per tablet 1 tablet  1 tablet Oral Q6H PRN Skeet Latch, MD   1 tablet at 06/27/20 1732  . calcitRIOL (ROCALTROL) capsule 0.5 mcg  0.5 mcg Oral Daily Reesa Chew, MD   0.5 mcg at 06/27/20 1021  . Chlorhexidine Gluconate Cloth 2 % PADS 6 each  6 each Topical Daily Norins, Heinz Knuckles, MD   6 each at 06/24/20 1043  . cinacalcet (SENSIPAR) tablet 30 mg  30 mg Oral Q breakfast Reesa Chew, MD   30 mg at 06/27/20 1021  . clopidogrel (PLAVIX) tablet 75 mg  75 mg Oral Daily Rosita Fire, [employer]  M, PA-C   75 mg at 06/27/20 0630  . Darbepoetin Alfa (ARANESP) injection 100 mcg  100 mcg Subcutaneous Q Sat-1800 Harrie Jeans C, MD      . diazepam (VALIUM) tablet 5 mg  5 mg Oral Q6H PRN Troy Sine, MD   5 mg at 06/27/20 2050  . feeding supplement (ENSURE ENLIVE / ENSURE PLUS) liquid 237 mL  237 mL Oral TID BM Vann, Jessica U, DO      . fidaxomicin (DIFICID) tablet 200 mg  200 mg Oral BID Norins, Heinz Knuckles, MD   200 mg at 06/27/20 2052  . gentamicin cream (GARAMYCIN) 0.1 % 1 application  1 application Topical Daily Reesa Chew, MD   1 application at 72/53/66 1043  . Gerhardt's butt cream   Topical PRN Thurnell Lose, MD   Given at 06/22/20 1616  . heparin 2,500 Units in dialysis solution 2.5% low-MG/low-CA 5,000 mL dialysis solution   Peritoneal  Dialysis PRN Reesa Chew, MD      . heparin injection 5,000 Units  5,000 Units Subcutaneous Q8H Einar Grad, RPH   5,000 Units at 06/27/20 2050  . levETIRAcetam (KEPPRA) IVPB 500 mg/100 mL premix  500 mg Intravenous Q12H Absher, Minette Brine, MD 400 mL/hr at 06/28/20 0502 500 mg at 06/28/20 0502  . metoprolol succinate (TOPROL-XL) 24 hr tablet 25 mg  25 mg Oral Daily Larey Dresser, MD   25 mg at 06/27/20 1021  . multivitamin (RENA-VIT) tablet 1 tablet  1 tablet Oral QHS Thurnell Lose, MD   1 tablet at 06/27/20 2050  . ondansetron (ZOFRAN) injection 4 mg  4 mg Intravenous Q6H PRN Troy Sine, MD   4 mg at 06/25/20 2341  . pantoprazole (PROTONIX) EC tablet 40 mg  40 mg Oral Daily Eulogio Bear U, DO   40 mg at 06/27/20 1021  . sodium chloride flush (NS) 0.9 % injection 3 mL  3 mL Intravenous Once Carmin Muskrat, MD      . sodium chloride flush (NS) 0.9 % injection 3 mL  3 mL Intravenous Q12H Kathyrn Drown D, NP   3 mL at 06/26/20 1042  . sodium chloride flush (NS) 0.9 % injection 3 mL  3 mL Intravenous Q12H Troy Sine, MD   3 mL at 06/26/20 1042  . sodium chloride flush (NS) 0.9 % injection 3 mL  3 mL Intravenous PRN Troy Sine, MD      . sodium chloride flush (NS) 0.9 % injection 3 mL  3 mL Intravenous Q12H Lyda Jester M, PA-C   3 mL at 06/27/20 1023  . sodium chloride flush (NS) 0.9 % injection 3 mL  3 mL Intravenous Q12H Sherren Mocha, MD      . sodium chloride flush (NS) 0.9 % injection 3 mL  3 mL Intravenous PRN Sherren Mocha, MD      . traZODone (DESYREL) tablet 25 mg  25 mg Oral QHS PRN Neena Rhymes, MD   25 mg at 06/27/20 2050      Physical Exam: Vitals:   06/27/20 2344 06/28/20 0415  BP: 106/64 122/74  Pulse: 89 91  Resp: 14 19  Temp: 98.5 F (36.9 C) 98.5 F (36.9 C)  SpO2: 100% 100%   No intake/output data recorded. No intake or output data in the 24 hours ending 06/28/20 0606  General adult female in bed in no acute distress   HEENT normocephalic atraumatic extraocular movements intact sclera anicteric Neck  supple trachea midline Lungs clear to auscultation bilaterally normal work of breathing at rest  Heart S1s2 no rub Abdomen soft nontender nondistended Extremities 1+ edema  Psych normal mood and affect Neuro alert and oriented x 3 provides hx and follows commands Access: tenckhoff dressing intact, clean  Test Results I personally reviewed new and old clinical labs and radiology tests Lab Results  Component Value Date   NA 130 (L) 06/28/2020   K 4.0 06/28/2020   CL 98 06/28/2020   CO2 22 06/28/2020   BUN 32 (H) 06/28/2020   CREATININE 4.63 (H) 06/28/2020   GLU 103 04/30/2014   CALCIUM 8.2 (L) 06/28/2020   ALBUMIN 1.6 (L) 06/27/2020   PHOS 3.1 06/27/2020     Outpt prescription Access  PD Catheter Right lower quadrant   Schedule  QD   PD Type  PD with Cycler   Vendor  [id number]   Calcium  2.50 mEq   Magnesium  0.5 mEq   Dextrose  Varied   Dextrose Instructions  Dextrose strength dependant on BP, weight, and edema.   Total Fills: Dry (Checked)  4   Total Fills: Dry (Checked) Details  4 Fills per Cycle @ 2000 ml   Cycle  Day   Cycle Drain Time  20 min   Cycle Fill Time  10 min   Cycle Dwell Time  1 hrs 49 min        Claudia Desanctis, MD 06/28/2020 6:19 AM

## 2020-06-28 NOTE — Progress Notes (Signed)
VASCULAR LAB    Right upper extremity arterial duplex to rule out psuedoaneurysm has been performed.  See CV proc for preliminary results.   KANADY, CANDACE, RVT 06/28/2020, 5:02 PM

## 2020-06-28 NOTE — Progress Notes (Addendum)
CARDIAC REHAB PHASE I   Pt declined getting to recliner. Generally weak. Discussed MI, stent, Plavix, and CRPII. Will refer to [facility]. She needs PT/OT (saw her yesterday). Will f/u Monday as time allows. Pt sts she does not see an RD for her diet. Would benefit from RD c/s inpatient or outpatient with new decreased EF, CAD, and renal failure.  [place], ACSM 06/28/2020 3:03 PM

## 2020-06-28 NOTE — Progress Notes (Signed)
Progress Note  Patient Name: Amy [name] Date of Encounter: 06/28/2020  Primary Cardiologist: Ida Rogue, MD   Subjective   Patient notes that she is doing fine.  Since day prior notes a hematoma on her right arm changes.  Relevant interval testing or therapy include LHC and lasix increase with nephrology.  No arm pain, no change in sensation.  Can move extremity.  Notes a headache (chronic)  No chest pain or pressure .  No SOB/DOE and no PND/Orthopnea.  No palpitations or syncope .  Inpatient Medications    Scheduled Meds: . sodium chloride   Intravenous Once  . aspirin  81 mg Oral Daily  . atorvastatin  80 mg Oral q1800  . buPROPion  75 mg Oral BID  . calcitRIOL  0.5 mcg Oral Daily  . Chlorhexidine Gluconate Cloth  6 each Topical Daily  . cinacalcet  30 mg Oral Q breakfast  . clopidogrel  75 mg Oral Daily  . darbepoetin (ARANESP) injection - NON-DIALYSIS  100 mcg Subcutaneous Q Sat-1800  . feeding supplement  237 mL Oral TID BM  . fidaxomicin  200 mg Oral BID  . furosemide  80 mg Intravenous Daily  . gentamicin cream  1 application Topical Daily  . heparin  5,000 Units Subcutaneous Q8H  . metoprolol succinate  25 mg Oral Daily  . multivitamin  1 tablet Oral QHS  . pantoprazole  40 mg Oral Daily  . potassium chloride  10 mEq Oral Daily  . sodium chloride flush  3 mL Intravenous Once  . sodium chloride flush  3 mL Intravenous Q12H  . sodium chloride flush  3 mL Intravenous Q12H  . sodium chloride flush  3 mL Intravenous Q12H  . sodium chloride flush  3 mL Intravenous Q12H   Continuous Infusions: . sodium chloride 250 mL (06/27/20 1417)  . sodium chloride    . levETIRAcetam 500 mg (06/28/20 0502)   PRN Meds: sodium chloride, sodium chloride, acetaminophen, butalbital-acetaminophen-caffeine, diazepam, Gerhardt's butt cream, dianeal solution for CAPD/[name] with heparin, ondansetron (ZOFRAN) IV, sodium chloride flush, sodium chloride flush, traZODone   Vital Signs     Vitals:   06/27/20 1621 06/27/20 1929 06/27/20 2344 06/28/20 0415  BP: 116/78 100/62 106/64 122/74  Pulse: 88 91 89 91  Resp: 14 15 14 19   Temp: 97.7 F (36.5 C) 98.8 F (37.1 C) 98.5 F (36.9 C) 98.5 F (36.9 C)  TempSrc: Oral Oral Oral Oral  SpO2: 100% 99% 100% 100%  Weight:    56.5 kg  Height:       No intake or output data in the 24 hours ending 06/28/20 0803 Filed Weights   06/27/20 0530 06/27/20 0600 06/28/20 0415  Weight: 53 kg 53 kg 56.5 kg    Telemetry    SR without ectopy - Personally Reviewed  ECG    SR 90 global TWI, QTc 518 - Personally Reviewed  Physical Exam   GEN: No acute distress.   Neck: No JVD Cardiac: RRR, no murmurs, rubs, or gallops.  R hematoma is soft with +2 radial pulse and distal sensation and cap refill intact Respiratory: Clear to auscultation bilaterally. GI: Soft, nontender, non-distended  MS: No edema; No deformity. Neuro:  Nonfocal  Psych: Normal affect   Labs    Chemistry Recent Labs  Lab 06/25/20 0104 06/25/20 1123 06/26/20 0107 06/27/20 0026 06/28/20 0121  NA 131*  --  129* 130* 130*  K 3.5  --  4.1 3.5 4.0  CL 98  --  97* 96* 98  CO2 23  --  21* 23 22  GLUCOSE 110*  --  125* 104* 108*  BUN 27*  --  30* 29* 32*  CREATININE 4.02*   < > 4.02* 4.19* 4.63*  CALCIUM 7.9*  --  8.1* 8.4* 8.2*  PROT 3.4*  --  3.6* 3.6*  --   ALBUMIN 1.4*  --  1.6* 1.6*  --   AST 22  --  20 19  --   ALT 20  --  18 21  --   ALKPHOS 80  --  91 77  --   BILITOT 0.5  --  0.2* 0.3  --   GFRNONAA 11*   < > 11* 11* 10*  ANIONGAP 10  --  11 11 10    < > = values in this interval not displayed.     Hematology Recent Labs  Lab 06/27/20 0026 06/27/20 1949 06/28/20 0121  WBC 8.7 9.8 8.8  RBC 2.70* 2.50* 2.47*  HGB 8.1* 7.5* 7.5*  HCT 24.0* 22.8* 22.3*  MCV 88.9 91.2 90.3  MCH 30.0 30.0 30.4  MCHC 33.8 32.9 33.6  RDW 18.4* 18.6* 18.6*  PLT 246 236 238    Cardiac EnzymesNo results for input(s): TROPONINI in the last 168 hours. No  results for input(s): TROPIPOC in the last 168 hours.   BNP Recent Labs  Lab 06/25/20 0104 06/26/20 0107 06/27/20 0026  BNP 1,416.4* 1,112.0* 1,371.7*     DDimer  Recent Labs  Lab 06/25/20 0104 06/26/20 0107 06/27/20 0026  DDIMER 1.87* 2.74* 2.90*     Radiology    CARDIAC CATHETERIZATION  Result Date: 06/27/2020 Successful OCT guided PCI of severe complex stenosis in the proximal LAD, reducing 90% stenosis to 0% with a 2.75x22 mm Resolute Onyx DES OCT findings: Lesion MLA 1.65 square mm, minimal calcification Post-PCI: Minimal stent area 4.93 square mm, full stent expansion and apposition  VAS US DOPPLER PRE CABG  Result Date: 06/26/2020 PREOPERATIVE VASCULAR EVALUATION  Indications:      Pre-CABG. Risk Factors:     Hypertension, hyperlipidemia, coronary artery disease. Comparison Study: No prior study Performing Technologist: Sharion Dove RVS  Examination Guidelines: A complete evaluation includes B-mode imaging, spectral Doppler, color Doppler, and power Doppler as needed of all accessible portions of each vessel. Bilateral testing is considered an integral part of a complete examination. Limited examinations for reoccurring indications may be performed as noted.  Right Carotid Findings: +----------+--------+--------+--------+------------+------------------+           PSV cm/sEDV cm/sStenosisDescribe    Comments           +----------+--------+--------+--------+------------+------------------+ CCA Prox  79      25                          intimal thickening +----------+--------+--------+--------+------------+------------------+ CCA Distal62      25                          intimal thickening +----------+--------+--------+--------+------------+------------------+ ICA Prox  63      17              heterogenous                   +----------+--------+--------+--------+------------+------------------+ ICA Distal77      31                                              +----------+--------+--------+--------+------------+------------------+  ECA       48      11                                             +----------+--------+--------+--------+------------+------------------+ Portions of this table do not appear on this page. +----------+--------+-------+--------+------------+           PSV cm/sEDV cmsDescribeArm Pressure +----------+--------+-------+--------+------------+ Subclavian59                                  +----------+--------+-------+--------+------------+ +---------+--------+--+--------+--+ VertebralPSV cm/s70EDV cm/s22 +---------+--------+--+--------+--+ Left Carotid Findings: +----------+--------+--------+--------+------------+------------------+           PSV cm/sEDV cm/sStenosisDescribe    Comments           +----------+--------+--------+--------+------------+------------------+ CCA Prox  72      20                          intimal thickening +----------+--------+--------+--------+------------+------------------+ CCA Distal53      18                          intimal thickening +----------+--------+--------+--------+------------+------------------+ ICA Prox  61      30              heterogenous                   +----------+--------+--------+--------+------------+------------------+ ICA Distal83      34                                             +----------+--------+--------+--------+------------+------------------+ ECA       46      10                                             +----------+--------+--------+--------+------------+------------------+ +----------+--------+--------+--------+------------+ SubclavianPSV cm/sEDV cm/sDescribeArm Pressure +----------+--------+--------+--------+------------+           83                                   +----------+--------+--------+--------+------------+ +---------+--------+--+--------+--+ VertebralPSV cm/s48EDV cm/s15  +---------+--------+--+--------+--+  ABI Findings: +--------+------------------+-----+-----------+--------+ Right   Rt Pressure (mmHg)IndexWaveform   Comment  +--------+------------------+-----+-----------+--------+ [id number]                    triphasic           +--------+------------------+-----+-----------+--------+ PTA                            multiphasic         +--------+------------------+-----+-----------+--------+ DP                             multiphasic         +--------+------------------+-----+-----------+--------+ +--------+------------------+-----+-----------+-------+ Left    Lt Pressure (mmHg)IndexWaveform   Comment +--------+------------------+-----+-----------+-------+ [id number]  triphasic          +--------+------------------+-----+-----------+-------+ PTA                            multiphasic        +--------+------------------+-----+-----------+-------+ DP                             multiphasic        +--------+------------------+-----+-----------+-------+  Right Doppler Findings: +-----------+--------+-----+-----------+---------------------------------------+ Site       PressureIndexDoppler    Comments                                +-----------+--------+-----+-----------+---------------------------------------+ Brachial   117          triphasic                                          +-----------+--------+-----+-----------+---------------------------------------+ Radial                  multiphasic                                        +-----------+--------+-----+-----------+---------------------------------------+ Ulnar                   multiphasic                                        +-----------+--------+-----+-----------+---------------------------------------+ Palmar Arch                        Doppler signal reverses with both                                          radial  and ulnar compression            +-----------+--------+-----+-----------+---------------------------------------+  Left Doppler Findings: +--------+--------+-----+-----------+--------+ Site    PressureIndexDoppler    Comments +--------+--------+-----+-----------+--------+ [id number]          triphasic           +--------+--------+-----+-----------+--------+ Radial               multiphasic         +--------+--------+-----+-----------+--------+ Ulnar                multiphasic         +--------+--------+-----+-----------+--------+  Summary: Right Carotid: The extracranial vessels were near-normal with only minimal wall                thickening or plaque. Left Carotid: The extracranial vessels were near-normal with only minimal wall               thickening or plaque. Vertebrals:  Bilateral vertebral arteries demonstrate antegrade flow. Subclavians: Normal flow hemodynamics were seen in bilateral subclavian              arteries. Right Upper Extremity: Doppler waveforms remain within normal limits with right radial compression. Doppler waveforms remain within normal limits with right ulnar compression. Left  Upper Extremity: Doppler waveform obliterate with left radial compression. Doppler waveforms remain within normal limits with left ulnar compression.  Electronically signed by Jamelle Haring on 06/26/2020 at 8:11:44 PM.    Final     Cardiac Studies   PCI: Successful OCT guided PCI of severe complex stenosis in the proximal LAD, reducing 90% stenosis to 0% with a 2.75x22 mm Resolute Onyx DES  OCT findings: Lesion MLA 1.65 square mm, minimal calcification Post-PCI: Minimal stent area 4.93 square mm, full stent expansion and apposition   Patient Profile     70 y.o. femalewith ESRD on PD, who presented with Seizures found to have NSTEMI and HFrEF  Assessment & Plan    CAD - on ASA and plavix for one year - atorvastatin 80 mg - continue metoprolol succinate 25 mg PO  daily  R arm hematoma - ordered R UE Duplex, no signs of compartment syndrome  HFrEF ESRD - BB as above - lasix and PD per nephrology  QTc prolongation - Care with trazodone and Zofran; continue on telemetry, monitor K and MG  Seizures - consideration of buproprion stop- discussed with primary  C Dif Recent COVID-19 - as per primary  Potential late 06/28/20 DC post duplex or 06/29/20 DC  Discussed with patient, nursing, and primary MD  For questions or updates, please contact [facility] Please consult [email or web address] for contact info under Cardiology/STEMI.      Signed, Werner Lean, MD  06/28/2020, 8:03 AM

## 2020-06-28 NOTE — Progress Notes (Signed)
Progress Note    Amy [name]  QQP:[id number] DOB: Mar 22, 1951  DOA: 06/19/2020 PCP: [name], MD    Brief Narrative:     Medical records reviewed and are as summarized below:  Amy [name] is an 70 y.o. female with medical history significant ofESRD on PD, HTN, multiple thyroid nodules. She had a hip fracture in December s/p ORIF. Recently hospitalized 1/25/-05/29/20 for watery diarrhea, hyponatremia, hypokalemia. She was found to be covid 19 positive but was not treated with no signs of respiratory involvement. She did test positive for C. Diff by PCR and was started on oral Vancomycin with a 9 day Rx at discharge which she was unable to get filled. She also developed sinus tachycardia with NSTEMI for which she was seen by cardiology. She is s/p heart cath and evaluated by CVTS for CABG. Deemed not a candidate so went cath lab 2/25 for stent  Assessment/Plan:   Active Problems:   Anxiety   Fever blister   Essential hypertension   Hypokalemia   Hyponatremia   HLD (hyperlipidemia)   ESRD on dialysis (HCC)   C. difficile diarrhea   C. difficile colitis   Seizure (HCC)   Pressure injury of skin   Protein-calorie malnutrition, severe   Non-ST elevation (NSTEMI) myocardial infarction ([place])   Ischemic cardiomyopathy   Seizure with postictal state : due to severe electrolyte abnormalities including hyponatremia, hypomagnesemia, hypokalemia caused by C. difficile diarrhea  - MRI negative -EEG shows postictal state, seen by neurology  - Potassium and magnesium now stable after aggressive replacement, has been hydrated with IV fluids - on Keppra  -Neuro recommends DC on Keppra with Neuro follow up -d/c wellbutrin for now-- outpatient follow up   C. difficile infection present on admission.  Being treated with Dificid as she seems to have failed 10-day course of vancomycin (not sure she took?).  Note C. difficile treatment with Dificid will be continued post discharge,  Dificid for home has been arranged by [facility] inpatient pharmacy, with the assistant of Ysidro Evert ID pharmacist per Dr. Candiss Norse-- will finish course in hospital as abx to 2/27   Recent COVID-19 incidental infection.  Supportive care.   ESRD (51yrs) .  On PD.  Renal following  Anemia of chronic disease.  Anemia panel suggestive of chronic disease, no signs of active bleeding, 1 unit of packed RBC transfusion on 06/22/2020   sinus tachycardia/ NSTEMI.  - CTA chest and leg ultrasound unremarkable - echocardiogram shows a depressed EF of 25% with typical wall motion abnormalities -s/p cath: Severe multivessel CAD with diffuse 80%, 95% and 80% proximal LAD stenosis, 90% diagonal stenosis;  85% focal ramus intermediate stenosis;  and 50% stenoses in the AV groove circumflex. -CVTS: not a candidate for CABG, unable to get cardiac MRI -cath lab on 2/25 for intervention: stent placed-- aSA/plavix for min of 1 year   Chronic systolic CHF with EF 25 to 30%.   -Fluid removal through PD.   Pressure Injury 06/20/20 Coccyx Medial Stage 1 -  Intact skin with non-blanchable redness of a localized area usually over a bony prominence. (Active)  06/20/20 1500  Location: Coccyx  Location Orientation: Medial  Staging: Stage 1 -  Intact skin with non-blanchable redness of a localized area usually over a bony prominence.  Wound Description (Comments):   Present on Admission: Yes       Family Communication/Anticipated D/C date and plan/Code Status   DVT prophylaxis: heparin Code Status: Full Code.  Disposition  Plan: Status is: Inpatient  Remains inpatient appropriate because:Inpatient level of care appropriate due to severity of illness   Dispo: The patient is from: Home              Anticipated d/c is to: Home              Anticipated d/c date is:2-3 days              Patient currently is not medically stable to d/c.   Difficult to place patient No         Medical Consultants:     Cards  CVTS  Renal   Subjective:   Hematoma around cath site  Objective:    Vitals:   06/27/20 1621 06/27/20 1929 06/27/20 2344 06/28/20 0415  BP: 116/78 100/62 106/64 122/74  Pulse: 88 91 89 91  Resp: 14 15 14 19   Temp: 97.7 F (36.5 C) 98.8 F (37.1 C) 98.5 F (36.9 C) 98.5 F (36.9 C)  TempSrc: Oral Oral Oral Oral  SpO2: 100% 99% 100% 100%  Weight:    56.5 kg  Height:       No intake or output data in the 24 hours ending 06/28/20 0833 Filed Weights   06/27/20 0530 06/27/20 0600 06/28/20 0415  Weight: 53 kg 53 kg 56.5 kg    Exam:   General: Appearance:    Chronically ill appearing female in no acute distress     Lungs:     Clear to auscultation bilaterally, respirations unlabored  Heart:    Normal heart rate.   MS:   All extremities are intact. Soft hematoma on right wrist  Neurologic:   Awake, alert, oriented x 3. No apparent focal neurological           defect.     Data Reviewed:   I have personally reviewed following labs and imaging studies:  Labs: Labs show the following:   Basic Metabolic Panel: Recent Labs  Lab 06/23/20 0057 06/24/20 0102 06/25/20 0104 06/25/20 1123 06/26/20 0107 06/27/20 0026 06/28/20 0121  NA 130* 129* 131*  --  129* 130* 130*  K 4.0 3.2* 3.5  --  4.1 3.5 4.0  CL 100 97* 98  --  97* 96* 98  CO2 18* 22 23  --  21* 23 22  GLUCOSE 193* 106* 110*  --  125* 104* 108*  BUN 36* 28* 27*  --  30* 29* 32*  CREATININE 5.04* 4.19* 4.02* 4.05* 4.02* 4.19* 4.63*  CALCIUM 8.0* 7.9* 7.9*  --  8.1* 8.4* 8.2*  MG 1.9 1.7 1.5*  --  1.8 1.8  --   PHOS  --   --   --   --   --  3.1  --    GFR Estimated Creatinine Clearance: 9.6 mL/min (A) (by C-G formula based on SCr of 4.63 mg/dL (H)). Liver Function Tests: Recent Labs  Lab 06/23/20 0057 06/24/20 0102 06/25/20 0104 06/26/20 0107 06/27/20 0026  AST 33 25 22 20 19   ALT 23 20 20 18 21   ALKPHOS 90 90 80 91 77  BILITOT 0.4 0.4 0.5 0.2* 0.3  PROT 4.1* 3.9* 3.4* 3.6* 3.6*   ALBUMIN 1.9* 1.7* 1.4* 1.6* 1.6*   No results for input(s): LIPASE, AMYLASE in the last 168 hours. No results for input(s): AMMONIA in the last 168 hours. Coagulation profile No results for input(s): INR, PROTIME in the last 168 hours.  CBC: Recent Labs  Lab 06/23/20 0057 06/24/20 0102 06/25/20 0104  06/25/20 1123 06/26/20 0107 06/27/20 0026 06/27/20 1949 06/28/20 0121  WBC 9.3 9.1 7.8 10.0 10.5 8.7 9.8 8.8  NEUTROABS 7.8* 6.7 5.6  --  8.4* 6.3  --   --   HGB 9.9* 10.2* 8.7* 9.7* 8.8* 8.1* 7.5* 7.5*  HCT 27.9* 29.8* 24.0* 26.5* 25.6* 24.0* 22.8* 22.3*  MCV 86.6 87.6 86.3 85.8 88.9 88.9 91.2 90.3  PLT 263 278 216 271 271 246 236 238   Cardiac Enzymes: No results for input(s): CKTOTAL, CKMB, CKMBINDEX, TROPONINI in the last 168 hours. BNP (last 3 results) No results for input(s): PROBNP in the last 8760 hours. CBG: No results for input(s): GLUCAP in the last 168 hours. D-Dimer: Recent Labs    06/26/20 0107 06/27/20 0026  DDIMER 2.74* 2.90*   Hgb A1c: No results for input(s): HGBA1C in the last 72 hours. Lipid Profile: No results for input(s): CHOL, HDL, LDLCALC, TRIG, CHOLHDL, LDLDIRECT in the last 72 hours. Thyroid function studies: No results for input(s): TSH, T4TOTAL, T3FREE, THYROIDAB in the last 72 hours.  Invalid input(s): FREET3 Anemia work up: No results for input(s): VITAMINB12, FOLATE, FERRITIN, TIBC, IRON, RETICCTPCT in the last 72 hours. Sepsis Labs: Recent Labs  Lab 06/23/20 0652 06/23/20 0930 06/24/20 0102 06/26/20 0107 06/27/20 0026 06/27/20 1949 06/28/20 0121  WBC  --   --    < > 10.5 8.7 9.8 8.8  LATICACIDVEN 4.2* 3.0*  --   --   --   --   --    < > = values in this interval not displayed.    Microbiology Recent Results (from the past 240 hour(s))  MRSA PCR Screening     Status: None   Collection Time: 06/21/20 12:34 AM   Specimen: Nasopharyngeal  Result Value Ref Range Status   MRSA by PCR NEGATIVE NEGATIVE Final    Comment:         The GeneXpert MRSA Assay (FDA approved for NASAL specimens only), is one component of a comprehensive MRSA colonization surveillance program. It is not intended to diagnose MRSA infection nor to guide or monitor treatment for MRSA infections. Performed at [facility], [place] [address], [place], [place] [address]     Procedures and diagnostic studies:  CARDIAC CATHETERIZATION  Result Date: 06/27/2020 Successful OCT guided PCI of severe complex stenosis in the proximal LAD, reducing 90% stenosis to 0% with a 2.75x22 mm Resolute Onyx DES OCT findings: Lesion MLA 1.65 square mm, minimal calcification Post-PCI: Minimal stent area 4.93 square mm, full stent expansion and apposition  VAS US DOPPLER PRE CABG  Result Date: 06/26/2020 PREOPERATIVE VASCULAR EVALUATION  Indications:      Pre-CABG. Risk Factors:     Hypertension, hyperlipidemia, coronary artery disease. Comparison Study: No prior study Performing Technologist: Sharion Dove RVS  Examination Guidelines: A complete evaluation includes B-mode imaging, spectral Doppler, color Doppler, and power Doppler as needed of all accessible portions of each vessel. Bilateral testing is considered an integral part of a complete examination. Limited examinations for reoccurring indications may be performed as noted.  Right Carotid Findings: +----------+--------+--------+--------+------------+------------------+           PSV cm/sEDV cm/sStenosisDescribe    Comments           +----------+--------+--------+--------+------------+------------------+ CCA Prox  79      25                          intimal thickening +----------+--------+--------+--------+------------+------------------+ CCA Distal62  25                          intimal thickening +----------+--------+--------+--------+------------+------------------+ ICA Prox  63      17              heterogenous                    +----------+--------+--------+--------+------------+------------------+ ICA Distal77      31                                             +----------+--------+--------+--------+------------+------------------+ ECA       48      11                                             +----------+--------+--------+--------+------------+------------------+ Portions of this table do not appear on this page. +----------+--------+-------+--------+------------+           PSV cm/sEDV cmsDescribeArm Pressure +----------+--------+-------+--------+------------+ Subclavian59                                  +----------+--------+-------+--------+------------+ +---------+--------+--+--------+--+ VertebralPSV cm/s70EDV cm/s22 +---------+--------+--+--------+--+ Left Carotid Findings: +----------+--------+--------+--------+------------+------------------+           PSV cm/sEDV cm/sStenosisDescribe    Comments           +----------+--------+--------+--------+------------+------------------+ CCA Prox  72      20                          intimal thickening +----------+--------+--------+--------+------------+------------------+ CCA Distal53      18                          intimal thickening +----------+--------+--------+--------+------------+------------------+ ICA Prox  61      30              heterogenous                   +----------+--------+--------+--------+------------+------------------+ ICA Distal83      34                                             +----------+--------+--------+--------+------------+------------------+ ECA       46      10                                             +----------+--------+--------+--------+------------+------------------+ +----------+--------+--------+--------+------------+ SubclavianPSV cm/sEDV cm/sDescribeArm Pressure +----------+--------+--------+--------+------------+           83                                    +----------+--------+--------+--------+------------+ +---------+--------+--+--------+--+ VertebralPSV cm/s48EDV cm/s15 +---------+--------+--+--------+--+  ABI Findings: +--------+------------------+-----+-----------+--------+ Right   Rt Pressure (mmHg)IndexWaveform   Comment  +--------+------------------+-----+-----------+--------+ [id number]  triphasic           +--------+------------------+-----+-----------+--------+ PTA                            multiphasic         +--------+------------------+-----+-----------+--------+ DP                             multiphasic         +--------+------------------+-----+-----------+--------+ +--------+------------------+-----+-----------+-------+ Left    Lt Pressure (mmHg)IndexWaveform   Comment +--------+------------------+-----+-----------+-------+ [id number]                    triphasic          +--------+------------------+-----+-----------+-------+ PTA                            multiphasic        +--------+------------------+-----+-----------+-------+ DP                             multiphasic        +--------+------------------+-----+-----------+-------+  Right Doppler Findings: +-----------+--------+-----+-----------+---------------------------------------+ Site       PressureIndexDoppler    Comments                                +-----------+--------+-----+-----------+---------------------------------------+ Brachial   117          triphasic                                          +-----------+--------+-----+-----------+---------------------------------------+ Radial                  multiphasic                                        +-----------+--------+-----+-----------+---------------------------------------+ Ulnar                   multiphasic                                        +-----------+--------+-----+-----------+---------------------------------------+  Palmar Arch                        Doppler signal reverses with both                                          radial and ulnar compression            +-----------+--------+-----+-----------+---------------------------------------+  Left Doppler Findings: +--------+--------+-----+-----------+--------+ Site    PressureIndexDoppler    Comments +--------+--------+-----+-----------+--------+ [id number]          triphasic           +--------+--------+-----+-----------+--------+ Radial               multiphasic         +--------+--------+-----+-----------+--------+ Ulnar                multiphasic         +--------+--------+-----+-----------+--------+  Summary: Right Carotid: The extracranial vessels were near-normal with only minimal wall                thickening or plaque. Left Carotid: The extracranial vessels were near-normal with only minimal wall               thickening or plaque. Vertebrals:  Bilateral vertebral arteries demonstrate antegrade flow. Subclavians: Normal flow hemodynamics were seen in bilateral subclavian              arteries. Right Upper Extremity: Doppler waveforms remain within normal limits with right radial compression. Doppler waveforms remain within normal limits with right ulnar compression. Left Upper Extremity: Doppler waveform obliterate with left radial compression. Doppler waveforms remain within normal limits with left ulnar compression.  Electronically signed by Jamelle Haring on 06/26/2020 at 8:11:44 PM.    Final     Medications:   . sodium chloride   Intravenous Once  . aspirin  81 mg Oral Daily  . atorvastatin  80 mg Oral q1800  . calcitRIOL  0.5 mcg Oral Daily  . Chlorhexidine Gluconate Cloth  6 each Topical Daily  . cinacalcet  30 mg Oral Q breakfast  . clopidogrel  75 mg Oral Daily  . darbepoetin (ARANESP) injection - NON-DIALYSIS  100 mcg Subcutaneous Q Sat-1800  . feeding supplement  237 mL Oral TID BM  . fidaxomicin  200 mg  Oral BID  . furosemide  80 mg Intravenous Daily  . gentamicin cream  1 application Topical Daily  . heparin  5,000 Units Subcutaneous Q8H  . metoprolol succinate  25 mg Oral Daily  . multivitamin  1 tablet Oral QHS  . pantoprazole  40 mg Oral Daily  . potassium chloride  10 mEq Oral Daily  . sodium chloride flush  3 mL Intravenous Once  . sodium chloride flush  3 mL Intravenous Q12H  . sodium chloride flush  3 mL Intravenous Q12H  . sodium chloride flush  3 mL Intravenous Q12H  . sodium chloride flush  3 mL Intravenous Q12H   Continuous Infusions: . sodium chloride 250 mL (06/27/20 1417)  . sodium chloride    . levETIRAcetam 500 mg (06/28/20 0502)     LOS: 9 days   Geradine Girt  [facility]   How to contact the [facility] Attending or Consulting provider [place] or covering provider during after hours [place], for this patient?  1. Check the care team in [facility] and look for a) attending/consulting [facility] provider listed and b) the [facility] team listed 2. Log into [email or web address] and use [place]'s universal password to access. If you do not have the password, please contact the hospital operator. 3. Locate the [facility] provider you are looking for under [facility] and page to a number that you can be directly reached. 4. If you still have difficulty reaching the provider, please page the [facility] (Director on Call) for the Hospitalists listed on amion for assistance.  06/28/2020, 8:33 AM

## 2020-06-29 DIAGNOSIS — I214 Non-ST elevation (NSTEMI) myocardial infarction: Secondary | ICD-10-CM | POA: Diagnosis not present

## 2020-06-29 DIAGNOSIS — A0472 Enterocolitis due to Clostridium difficile, not specified as recurrent: Secondary | ICD-10-CM | POA: Diagnosis not present

## 2020-06-29 DIAGNOSIS — E871 Hypo-osmolality and hyponatremia: Secondary | ICD-10-CM | POA: Diagnosis not present

## 2020-06-29 DIAGNOSIS — Z992 Dependence on renal dialysis: Secondary | ICD-10-CM | POA: Diagnosis not present

## 2020-06-29 DIAGNOSIS — N186 End stage renal disease: Secondary | ICD-10-CM | POA: Diagnosis not present

## 2020-06-29 LAB — CBC
HCT: 19.1 % — ABNORMAL LOW (ref 36.0–46.0)
Hemoglobin: 6.7 g/dL — CL (ref 12.0–15.0)
MCH: 31.5 pg (ref 26.0–34.0)
MCHC: 35.1 g/dL (ref 30.0–36.0)
MCV: 89.7 fL (ref 80.0–100.0)
Platelets: 238 10*3/uL (ref 150–400)
RBC: 2.13 MIL/uL — ABNORMAL LOW (ref 3.87–5.11)
RDW: 18.7 % — ABNORMAL HIGH (ref 11.5–15.5)
WBC: 7.3 10*3/uL (ref 4.0–10.5)
nRBC: 0 % (ref 0.0–0.2)

## 2020-06-29 LAB — RENAL FUNCTION PANEL
Albumin: 1.4 g/dL — ABNORMAL LOW (ref 3.5–5.0)
Anion gap: 11 (ref 5–15)
BUN: 29 mg/dL — ABNORMAL HIGH (ref 8–23)
CO2: 23 mmol/L (ref 22–32)
Calcium: 8.3 mg/dL — ABNORMAL LOW (ref 8.9–10.3)
Chloride: 97 mmol/L — ABNORMAL LOW (ref 98–111)
Creatinine, Ser: 4.65 mg/dL — ABNORMAL HIGH (ref 0.44–1.00)
GFR, Estimated: 10 mL/min — ABNORMAL LOW (ref >60)
Glucose, Bld: 100 mg/dL — ABNORMAL HIGH (ref 70–99)
Phosphorus: 3.9 mg/dL (ref 2.5–4.6)
Potassium: 3.7 mmol/L (ref 3.5–5.1)
Sodium: 131 mmol/L — ABNORMAL LOW (ref 135–145)

## 2020-06-29 LAB — HEMOGLOBIN AND HEMATOCRIT, BLOOD
HCT: 33.1 % — ABNORMAL LOW (ref 36.0–46.0)
Hemoglobin: 11 g/dL — ABNORMAL LOW (ref 12.0–15.0)

## 2020-06-29 LAB — AMMONIA: Ammonia: 74 umol/L — ABNORMAL HIGH (ref 9–35)

## 2020-06-29 LAB — PREPARE RBC (CROSSMATCH)

## 2020-06-29 MED ORDER — SODIUM CHLORIDE 0.9% IV SOLUTION: Freq: Once | INTRAVENOUS | Status: AC

## 2020-06-29 MED ORDER — FUROSEMIDE 10 MG/ML IJ SOLN
20.0000 mg | Freq: Once | INTRAMUSCULAR | Status: AC
  Administered 2020-06-29: 20 mg via INTRAVENOUS

## 2020-06-29 MED ORDER — ACETAMINOPHEN 325 MG PO TABS: 650.0000 mg | ORAL_TABLET | Freq: Once | ORAL | Status: DC

## 2020-06-29 MED ORDER — DIPHENHYDRAMINE HCL 25 MG PO CAPS
25.0000 mg | ORAL_CAPSULE | Freq: Once | ORAL | Status: AC
  Administered 2020-06-29: 25 mg via ORAL
  Filled 2020-06-29: qty 1

## 2020-06-29 MED ORDER — RIFAXIMIN 550 MG PO TABS
550.0000 mg | ORAL_TABLET | Freq: Two times a day (BID) | ORAL | Status: AC
  Administered 2020-06-29 – 2020-06-30 (×2): 550 mg via ORAL
  Filled 2020-06-29 (×2): qty 1

## 2020-06-29 NOTE — Progress Notes (Signed)
Nephrology Follow-Up Consult note   Assessment/Recommendations: Amy [name] is a/an 70 y.o. female with a past medical history significant for ESRD on PD admitted with multiple electrolyte derangements, diarrhea, C. difficile, possible seizure.  Now found to have multivessel CAD   # ESRD:  - Continue PD - mix 1.5 and 2.5% dextrose tonight.  Added lasix 80 mg PO daily as below  - await am labs - ordered renal panel today and tomorrow  # Volume/ hypertension:   - Controlled with PD as above  # Multivessel CAD - per cardiology and CT surgery.  S/p stent placement  # Heart failure reduced EF - saline lock IV. Gentle UF as above. Added lasix 80 mg PO daily as above  # Anemia of Chronic Kidney Disease:  on ESA - increased to aranesp 100 mcg every sat (for the 2/26 dose).  PRBC's on 2/27 - getting 1-2 units per primary  # Secondary Hyperparathyroidism/Hyperphosphatemia: Continue calcitriol 0.5 MCG daily and Sensipar 30 mg daily.   #Hyponatremia: on PD as above.  #Hypokalemia: I have added potassium 10 meq once daily; mag in am  #Acidosis: Mild metabolic acidosis likely related to diarrhea. stopped oral bicarbonate   #Possible seizure: Receiving Keppra.    Neurology has been consulted   Disposition per primary team.  Once discharged I have asked her to set up follow-up with labs with home training unit for PD within a week   ________________________________________________________   Interval History/Subjective:  200 mL UOP charted for 2/26.  Normally at home does all 1.5's sometimes 1.5 and 2.5 mix.  Ok with PD.  She's getting a unit of blood now and per nursing they are rechecking hb before a second unit   2/26 - UF 1569 mL 2/25 - UF 1477 2/24 - 850 mL UF 2/23 - 627 mL UF 2/22 am had 483 ml UF 2/21 am had 2.9 liters UF   Review of systems:     Denies shortness of breath or chest pain  Denies n/v No diarrhea  Medications:  Current Facility-Administered Medications   Medication Dose Route Frequency Provider Last Rate Last Admin  . 0.9 %  sodium chloride infusion (Manually program via Guardrails IV Fluids)   Intravenous Once Thurnell Lose, MD   Held at 06/22/20 [phone]  . 0.9 %  sodium chloride infusion  250 mL Intravenous PRN Troy Sine, MD 888 mL/hr at 06/27/20 1417 250 mL at 06/27/20 1417  . 0.9 %  sodium chloride infusion  250 mL Intravenous PRN Sherren Mocha, MD      . acetaminophen (TYLENOL) tablet 650 mg  650 mg Oral Q4H PRN Eulogio Bear U, DO   650 mg at 06/27/20 2050  . acetaminophen (TYLENOL) tablet 650 mg  650 mg Oral Once Chotiner, Yevonne Aline, MD      . aspirin chewable tablet 81 mg  81 mg Oral Daily Troy Sine, MD   81 mg at 06/28/20 1220  . atorvastatin (LIPITOR) tablet 80 mg  80 mg Oral q1800 Troy Sine, MD   80 mg at 06/28/20 1711  . butalbital-acetaminophen-caffeine (FIORICET) 50-325-40 MG per tablet 1-2 tablet  1-2 tablet Oral Q6H PRN Eulogio Bear U, DO   2 tablet at 06/29/20 0412  . calcitRIOL (ROCALTROL) capsule 0.5 mcg  0.5 mcg Oral Daily Reesa Chew, MD   0.5 mcg at 06/28/20 1219  . Chlorhexidine Gluconate Cloth 2 % PADS 6 each  6 each Topical Daily Norins, Heinz Knuckles, MD  6 each at 06/28/20 1222  . cinacalcet (SENSIPAR) tablet 30 mg  30 mg Oral Q breakfast Reesa Chew, MD   30 mg at 06/28/20 0746  . clopidogrel (PLAVIX) tablet 75 mg  75 mg Oral Daily Lyda Jester M, PA-C   75 mg at 06/28/20 1219  . Darbepoetin Alfa (ARANESP) injection 100 mcg  100 mcg Subcutaneous Q Sat-1800 Claudia Desanctis, MD   100 mcg at 06/28/20 1712  . diazepam (VALIUM) tablet 5 mg  5 mg Oral Q6H PRN Troy Sine, MD   5 mg at 06/28/20 2033  . feeding supplement (ENSURE ENLIVE / ENSURE PLUS) liquid 237 mL  237 mL Oral TID BM Vann, Jessica U, DO      . fidaxomicin (DIFICID) tablet 200 mg  200 mg Oral BID Norins, Heinz Knuckles, MD   200 mg at 06/28/20 2033  . furosemide (LASIX) injection 20 mg  20 mg Intravenous Once Chotiner, Yevonne Aline, MD      . furosemide (LASIX) injection 80 mg  80 mg Intravenous Daily Claudia Desanctis, MD   80 mg at 06/28/20 1217  . gentamicin cream (GARAMYCIN) 0.1 % 1 application  1 application Topical Daily Reesa Chew, MD   1 application at 30/16/01 1223  . Gerhardt's butt cream   Topical PRN Thurnell Lose, MD   Given at 06/22/20 1616  . heparin 2,500 Units in dialysis solution 2.5% low-MG/low-CA 5,000 mL dialysis solution   Peritoneal Dialysis PRN Reesa Chew, MD      . levETIRAcetam (KEPPRA) IVPB 500 mg/100 mL premix  500 mg Intravenous Q12H Perfecto Kingdom, MD 400 mL/hr at 06/29/20 0420 500 mg at 06/29/20 0420  . metoprolol succinate (TOPROL-XL) 24 hr tablet 25 mg  25 mg Oral Daily Larey Dresser, MD   25 mg at 06/28/20 1220  . multivitamin (RENA-VIT) tablet 1 tablet  1 tablet Oral QHS Thurnell Lose, MD   1 tablet at 06/28/20 2033  . ondansetron (ZOFRAN) injection 4 mg  4 mg Intravenous Q6H PRN Troy Sine, MD   4 mg at 06/25/20 2341  . pantoprazole (PROTONIX) EC tablet 40 mg  40 mg Oral Daily Eulogio Bear U, DO   40 mg at 06/28/20 1219  . potassium chloride (KLOR-CON) CR tablet 10 mEq  10 mEq Oral Daily Claudia Desanctis, MD   10 mEq at 06/28/20 1220  . sodium chloride flush (NS) 0.9 % injection 3 mL  3 mL Intravenous Once Carmin Muskrat, MD      . sodium chloride flush (NS) 0.9 % injection 3 mL  3 mL Intravenous Q12H Kathyrn Drown D, NP   3 mL at 06/28/20 1224  . sodium chloride flush (NS) 0.9 % injection 3 mL  3 mL Intravenous Q12H Troy Sine, MD   3 mL at 06/28/20 1224  . sodium chloride flush (NS) 0.9 % injection 3 mL  3 mL Intravenous PRN Troy Sine, MD      . sodium chloride flush (NS) 0.9 % injection 3 mL  3 mL Intravenous Q12H Lyda Jester M, PA-C   3 mL at 06/28/20 1223  . sodium chloride flush (NS) 0.9 % injection 3 mL  3 mL Intravenous Q12H Sherren Mocha, MD   3 mL at 06/28/20 1223  . sodium chloride flush (NS) 0.9 % injection 3 mL  3 mL Intravenous  PRN Sherren Mocha, MD      . traZODone (DESYREL) tablet 25 mg  25 mg Oral QHS PRN Neena Rhymes, MD   25 mg at 06/28/20 2032      Physical Exam: Vitals:   06/29/20 0510 06/29/20 0545  BP: (!) 95/59 104/63  Pulse: 89 90  Resp: 14 14  Temp: 98.1 F (36.7 C) 98.1 F (36.7 C)  SpO2: 96%    No intake/output data recorded.  Intake/Output Summary (Last 24 hours) at 06/29/2020 0603 Last data filed at 06/28/2020 1600 Gross per 24 hour  Intake 240 ml  Output 200 ml  Net 40 ml    General adult female in bed in no acute distress  HEENT normocephalic atraumatic extraocular movements intact sclera anicteric Neck supple trachea midline Lungs clear to auscultation bilaterally normal work of breathing at rest  Heart S1s2 no rub Abdomen soft nontender nondistended Extremities 1+ edema  Psych normal mood and affect Neuro alert and oriented x 3 provides hx and follows commands Access: tenckhoff dressing intact, clean  Test Results I personally reviewed new and old clinical labs and radiology tests Lab Results  Component Value Date   NA 130 (L) 06/28/2020   K 4.0 06/28/2020   CL 98 06/28/2020   CO2 22 06/28/2020   BUN 32 (H) 06/28/2020   CREATININE 4.63 (H) 06/28/2020   GLU 103 04/30/2014   CALCIUM 8.2 (L) 06/28/2020   ALBUMIN 1.6 (L) 06/27/2020   PHOS 3.1 06/27/2020     Outpt prescription Access  PD Catheter Right lower quadrant   Schedule  QD   PD Type  PD with Cycler   Vendor  [id number]   Calcium  2.50 mEq   Magnesium  0.5 mEq   Dextrose  Varied   Dextrose Instructions  Dextrose strength dependant on BP, weight, and edema.   Total Fills: Dry (Checked)  4   Total Fills: Dry (Checked) Details  4 Fills per Cycle @ 2000 ml   Cycle  Day   Cycle Drain Time  20 min   Cycle Fill Time  10 min   Cycle Dwell Time  1 hrs 49 min        Claudia Desanctis, MD 06/29/2020 6:16 AM

## 2020-06-29 NOTE — Progress Notes (Signed)
Progress Note  Patient Name: Amy [name] Date of Encounter: 06/29/2020  Primary Cardiologist: Ida Rogue, MD   Subjective   More somnolent than prior.  Relevant interval testing or therapy R UE Duplex that appears WNL.   No arm pain of sensory changes.  Patient was difficult to rouse but conversant once awake.  Inpatient Medications    Scheduled Meds: . sodium chloride   Intravenous Once  . acetaminophen  650 mg Oral Once  . aspirin  81 mg Oral Daily  . atorvastatin  80 mg Oral q1800  . calcitRIOL  0.5 mcg Oral Daily  . Chlorhexidine Gluconate Cloth  6 each Topical Daily  . cinacalcet  30 mg Oral Q breakfast  . clopidogrel  75 mg Oral Daily  . darbepoetin (ARANESP) injection - NON-DIALYSIS  100 mcg Subcutaneous Q Sat-1800  . feeding supplement  237 mL Oral TID BM  . fidaxomicin  200 mg Oral BID  . furosemide  20 mg Intravenous Once  . furosemide  80 mg Intravenous Daily  . gentamicin cream  1 application Topical Daily  . metoprolol succinate  25 mg Oral Daily  . multivitamin  1 tablet Oral QHS  . pantoprazole  40 mg Oral Daily  . potassium chloride  10 mEq Oral Daily  . sodium chloride flush  3 mL Intravenous Once  . sodium chloride flush  3 mL Intravenous Q12H  . sodium chloride flush  3 mL Intravenous Q12H  . sodium chloride flush  3 mL Intravenous Q12H  . sodium chloride flush  3 mL Intravenous Q12H   Continuous Infusions: . sodium chloride 250 mL (06/27/20 1417)  . sodium chloride    . levETIRAcetam 500 mg (06/29/20 0420)   PRN Meds: sodium chloride, sodium chloride, acetaminophen, butalbital-acetaminophen-caffeine, diazepam, Gerhardt's butt cream, dianeal solution for CAPD/[name] with heparin, ondansetron (ZOFRAN) IV, sodium chloride flush, sodium chloride flush, traZODone   Vital Signs    Vitals:   06/29/20 0400 06/29/20 0510 06/29/20 0545 06/29/20 0808  BP: 98/64 (!) 95/59 104/63 108/63  Pulse: 91 89 90 78  Resp: 15 14 14 16   Temp: (!) 97.5 F  (36.4 C) 98.1 F (36.7 C) 98.1 F (36.7 C) 98.2 F (36.8 C)  TempSrc: Oral Oral  Oral  SpO2: 98% 96%  98%  Weight: 57.4 kg     Height:        Intake/Output Summary (Last 24 hours) at 06/29/2020 0824 Last data filed at 06/29/2020 0700 Gross per 24 hour  Intake 1231.45 ml  Output 250 ml  Net 981.45 ml   Filed Weights   06/27/20 0600 06/28/20 0415 06/29/20 0400  Weight: 53 kg 56.5 kg 57.4 kg    Telemetry    SR - Personally Reviewed  ECG    No new - Personally Reviewed  Physical Exam   GEN: No acute distress.   Neck: No JVD Cardiac: RRR, no murmurs, rubs, or gallops.  R hematoma has improved apperance +2 radial pulse and distal sensation and cap refill intact Respiratory: Clear to auscultation bilaterally. GI: Soft, nontender, non-distended  MS: No edema; No deformity. Neuro:  Nonfocal  Psych: Normal affect   Labs    Chemistry Recent Labs  Lab 06/25/20 0104 06/25/20 1123 06/26/20 0107 06/27/20 0026 06/28/20 0121 06/29/20 0111  NA 131*  --  129* 130* 130* 131*  K 3.5  --  4.1 3.5 4.0 3.7  CL 98  --  97* 96* 98 97*  CO2 23  --  21* 23 22 23   GLUCOSE 110*  --  125* 104* 108* 100*  BUN 27*  --  30* 29* 32* 29*  CREATININE 4.02*   < > 4.02* 4.19* 4.63* 4.65*  CALCIUM 7.9*  --  8.1* 8.4* 8.2* 8.3*  PROT 3.4*  --  3.6* 3.6*  --   --   ALBUMIN 1.4*  --  1.6* 1.6*  --  1.4*  AST 22  --  20 19  --   --   ALT 20  --  18 21  --   --   ALKPHOS 80  --  91 77  --   --   BILITOT 0.5  --  0.2* 0.3  --   --   GFRNONAA 11*   < > 11* 11* 10* 10*  ANIONGAP 10  --  11 11 10 11    < > = values in this interval not displayed.     Hematology Recent Labs  Lab 06/27/20 1949 06/28/20 0121 06/29/20 0111  WBC 9.8 8.8 7.3  RBC 2.50* 2.47* 2.13*  HGB 7.5* 7.5* 6.7*  HCT 22.8* 22.3* 19.1*  MCV 91.2 90.3 89.7  MCH 30.0 30.4 31.5  MCHC 32.9 33.6 35.1  RDW 18.6* 18.6* 18.7*  PLT 236 238 238    Cardiac EnzymesNo results for input(s): TROPONINI in the last 168 hours. No  results for input(s): TROPIPOC in the last 168 hours.   BNP Recent Labs  Lab 06/25/20 0104 06/26/20 0107 06/27/20 0026  BNP 1,416.4* 1,112.0* 1,371.7*     DDimer  Recent Labs  Lab 06/25/20 0104 06/26/20 0107 06/27/20 0026  DDIMER 1.87* 2.74* 2.90*     Radiology    CARDIAC CATHETERIZATION  Result Date: 06/27/2020 Successful OCT guided PCI of severe complex stenosis in the proximal LAD, reducing 90% stenosis to 0% with a 2.75x22 mm Resolute Onyx DES OCT findings: Lesion MLA 1.65 square mm, minimal calcification Post-PCI: Minimal stent area 4.93 square mm, full stent expansion and apposition  VAS [place] UPPER EXTREMITY ARTERIAL DUPLEX  Result Date: 06/28/2020 UPPER EXTREMITY DUPLEX STUDY Indications: Patient complains of Hematoma. History:     Patient has a history of catheterization via right radial artery.  Comparison Study: No prior study on file Performing Technologist: Sharion Dove RVS  Examination Guidelines: A complete evaluation includes B-mode imaging, spectral Doppler, color Doppler, and power Doppler as needed of all accessible portions of each vessel. Bilateral testing is considered an integral part of a complete examination. Limited examinations for reoccurring indications may be performed as noted.   Summary:  Right: No evidence of pseudoaneurysm or AV fistula noted in the        right upper extremity. *See table(s) above for measurements and observations.    Preliminary     Cardiac Studies   PCI: Successful OCT guided PCI of severe complex stenosis in the proximal LAD, reducing 90% stenosis to 0% with a 2.75x22 mm Resolute Onyx DES  OCT findings: Lesion MLA 1.65 square mm, minimal calcification Post-PCI: Minimal stent area 4.93 square mm, full stent expansion and apposition  RU Duplex Prelim: Right: No evidence of pseudoaneurysm or AV fistula noted in the     right upper extremity.  *See table(s) above for measurements and observations.   Patient Profile      70 y.o. femalewith ESRD on PD, who presented with Seizures found to have NSTEMI and HFrEF  Assessment & Plan    CAD NSTEMI s/p Prox LAD PCI - on ASA and plavix  for one year - atorvastatin 80 mg - continue metoprolol succinate 25 mg PO daily  R arm hematoma Anemia - no signs of compartment syndrome - stable from prior - agree with hemoglobin goal of at least 7 given recent PCI/NSTEMI  HFrEF ESRD - BB as above - lasix and PD per nephrology - may not tolerate home losartan; if nephrology is in agreement can start 25 mg PO Daily losartan as discharge order  QTc prolongation - Care with trazodone and Zofran; continue on telemetry, monitor K and MG  Seizures C Dif Recent COVID-19 - as per primary  Nearing DC; will arrange follow up with Dr. Rockey Situ  For questions or updates, please contact [place] Please consult [email or web address] for contact info under Cardiology/STEMI.      Signed, Werner Lean, MD  06/29/2020, 8:24 AM

## 2020-06-29 NOTE — Progress Notes (Signed)
Progress Note    Amy [name]  PPI:[id number] DOB: 1950/12/27  DOA: 06/19/2020 PCP: [name], MD    Brief Narrative:     Medical records reviewed and are as summarized below:  Amy [name] is an 70 y.o. female with medical history significant ofESRD on PD, HTN, multiple thyroid nodules. She had a hip fracture in December s/p ORIF. Recently hospitalized 1/25/-05/29/20 for watery diarrhea, hyponatremia, hypokalemia. She was found to be covid 19 positive but was not treated with no signs of respiratory involvement. She did test positive for C. Diff by PCR and was started on oral Vancomycin with a 9 day Rx at discharge which she was unable to get filled. She also developed sinus tachycardia with NSTEMI for which she was seen by cardiology. She is s/p heart cath and evaluated by CVTS for CABG. Deemed not a candidate so went cath lab 2/25 for stent  Assessment/Plan:   Active Problems:   Anxiety   Fever blister   Essential hypertension   Hypokalemia   Hyponatremia   HLD (hyperlipidemia)   ESRD on dialysis (HCC)   C. difficile diarrhea   C. difficile colitis   Seizure (HCC)   Pressure injury of skin   Protein-calorie malnutrition, severe   Non-ST elevation (NSTEMI) myocardial infarction ([place])   Ischemic cardiomyopathy   Seizure with postictal state : due to severe electrolyte abnormalities including hyponatremia, hypomagnesemia, hypokalemia caused by C. difficile diarrhea  - MRI negative -EEG shows postictal state, seen by neurology  - Potassium and magnesium now stable after aggressive replacement, has been hydrated with IV fluids - on Keppra  -Neuro recommends DC on Keppra with Neuro follow up -d/c wellbutrin for now-- outpatient follow up   C. difficile infection present on admission.  Being treated with Dificid as she seems to have failed 10-day course of vancomycin (not sure she took?).   -will finish course in hospital as abx to 2/27   Recent COVID-19  incidental infection.  Supportive care.   ESRD (87yrs) .  On PD.  Renal following  Anemia of chronic disease.  Anemia panel suggestive of chronic disease, no signs of active bleeding, 1 unit of packed RBC transfusion on 06/22/2020 and again 2/27  sinus tachycardia/ NSTEMI.  - CTA chest and leg ultrasound unremarkable - echocardiogram shows a depressed EF of 25% with typical wall motion abnormalities -s/p cath: Severe multivessel CAD with diffuse 80%, 95% and 80% proximal LAD stenosis, 90% diagonal stenosis;  85% focal ramus intermediate stenosis;  and 50% stenoses in the AV groove circumflex. -CVTS: not a candidate for CABG, unable to get cardiac MRI -cath lab on 2/25 for intervention: stent placed-- aSA/plavix for min of 1 year   Chronic systolic CHF with EF 25 to 30%.   -Fluid removal through PD.   Pressure Injury 06/20/20 Coccyx Medial Stage 1 -  Intact skin with non-blanchable redness of a localized area usually over a bony prominence. (Active)  06/20/20 1500  Location: Coccyx  Location Orientation: Medial  Staging: Stage 1 -  Intact skin with non-blanchable redness of a localized area usually over a bony prominence.  Wound Description (Comments):   Present on Admission: Yes       Family Communication/Anticipated D/C date and plan/Code Status   DVT prophylaxis: scd Code Status: Full Code.  Disposition Plan: Status is: Inpatient  Remains inpatient appropriate because:Inpatient level of care appropriate due to severity of illness   Dispo: The patient is from: Home  Anticipated d/c is to: Home              Anticipated d/c date is:1 day              Patient currently is not medically stable to d/c.   Difficult to place patient No         Medical Consultants:    Cards  CVTS  Renal   Subjective:   Sleepy this AM but got benadryl overnight  Objective:    Vitals:   06/29/20 0510 06/29/20 0545 06/29/20 0808 06/29/20 1204  BP: (!) 95/59  104/63 108/63 111/65  Pulse: 89 90 78 95  Resp: 14 14 16  (!) 21  Temp: 98.1 F (36.7 C) 98.1 F (36.7 C) 98.2 F (36.8 C)   TempSrc: Oral  Oral   SpO2: 96%  98% 96%  Weight:      Height:        Intake/Output Summary (Last 24 hours) at 06/29/2020 1204 Last data filed at 06/29/2020 1100 Gross per 24 hour  Intake 9238.45 ml  Output 9713 ml  Net -474.55 ml   Filed Weights   06/27/20 0600 06/28/20 0415 06/29/20 0400  Weight: 53 kg 56.5 kg 57.4 kg    Exam:   General: Appearance:    sleepy female in no acute distress     Lungs:      respirations unlabored  Heart:    Normal heart rate.   MS:   All extremities are intact.   Neurologic:   Will awake and answer questions appropriately      Data Reviewed:   I have personally reviewed following labs and imaging studies:  Labs: Labs show the following:   Basic Metabolic Panel: Recent Labs  Lab 06/23/20 0057 06/24/20 0102 06/25/20 0104 06/25/20 1123 06/26/20 0107 06/27/20 0026 06/28/20 0121 06/29/20 0111  NA 130* 129* 131*  --  129* 130* 130* 131*  K 4.0 3.2* 3.5  --  4.1 3.5 4.0 3.7  CL 100 97* 98  --  97* 96* 98 97*  CO2 18* 22 23  --  21* 23 22 23   GLUCOSE 193* 106* 110*  --  125* 104* 108* 100*  BUN 36* 28* 27*  --  30* 29* 32* 29*  CREATININE 5.04* 4.19* 4.02* 4.05* 4.02* 4.19* 4.63* 4.65*  CALCIUM 8.0* 7.9* 7.9*  --  8.1* 8.4* 8.2* 8.3*  MG 1.9 1.7 1.5*  --  1.8 1.8  --   --   PHOS  --   --   --   --   --  3.1  --  3.9   GFR Estimated Creatinine Clearance: 9.6 mL/min (A) (by C-G formula based on SCr of 4.65 mg/dL (H)). Liver Function Tests: Recent Labs  Lab 06/23/20 0057 06/24/20 0102 06/25/20 0104 06/26/20 0107 06/27/20 0026 06/29/20 0111  AST 33 25 22 20 19   --   ALT 23 20 20 18 21   --   ALKPHOS 90 90 80 91 77  --   BILITOT 0.4 0.4 0.5 0.2* 0.3  --   PROT 4.1* 3.9* 3.4* 3.6* 3.6*  --   ALBUMIN 1.9* 1.7* 1.4* 1.6* 1.6* 1.4*   No results for input(s): LIPASE, AMYLASE in the last 168 hours. No  results for input(s): AMMONIA in the last 168 hours. Coagulation profile No results for input(s): INR, PROTIME in the last 168 hours.  CBC: Recent Labs  Lab 06/23/20 0057 06/24/20 0102 06/25/20 0104 06/25/20 1123 06/26/20 0107 06/27/20 0026 06/27/20  1949 06/28/20 0121 06/29/20 0111  WBC 9.3 9.1 7.8   < > 10.5 8.7 9.8 8.8 7.3  NEUTROABS 7.8* 6.7 5.6  --  8.4* 6.3  --   --   --   HGB 9.9* 10.2* 8.7*   < > 8.8* 8.1* 7.5* 7.5* 6.7*  HCT 27.9* 29.8* 24.0*   < > 25.6* 24.0* 22.8* 22.3* 19.1*  MCV 86.6 87.6 86.3   < > 88.9 88.9 91.2 90.3 89.7  PLT 263 278 216   < > 271 246 236 238 238   < > = values in this interval not displayed.   Cardiac Enzymes: No results for input(s): CKTOTAL, CKMB, CKMBINDEX, TROPONINI in the last 168 hours. BNP (last 3 results) No results for input(s): PROBNP in the last 8760 hours. CBG: No results for input(s): GLUCAP in the last 168 hours. D-Dimer: Recent Labs    06/27/20 0026  DDIMER 2.90*   Hgb A1c: No results for input(s): HGBA1C in the last 72 hours. Lipid Profile: No results for input(s): CHOL, HDL, LDLCALC, TRIG, CHOLHDL, LDLDIRECT in the last 72 hours. Thyroid function studies: No results for input(s): TSH, T4TOTAL, T3FREE, THYROIDAB in the last 72 hours.  Invalid input(s): FREET3 Anemia work up: No results for input(s): VITAMINB12, FOLATE, FERRITIN, TIBC, IRON, RETICCTPCT in the last 72 hours. Sepsis Labs: Recent Labs  Lab 06/23/20 0652 06/23/20 0930 06/24/20 0102 06/27/20 0026 06/27/20 1949 06/28/20 0121 06/29/20 0111  WBC  --   --    < > 8.7 9.8 8.8 7.3  LATICACIDVEN 4.2* 3.0*  --   --   --   --   --    < > = values in this interval not displayed.    Microbiology Recent Results (from the past 240 hour(s))  MRSA PCR Screening     Status: None   Collection Time: 06/21/20 12:34 AM   Specimen: Nasopharyngeal  Result Value Ref Range Status   MRSA by PCR NEGATIVE NEGATIVE Final    Comment:        The GeneXpert MRSA Assay  (FDA approved for NASAL specimens only), is one component of a comprehensive MRSA colonization surveillance program. It is not intended to diagnose MRSA infection nor to guide or monitor treatment for MRSA infections. Performed at [facility], [place] [address] [place], [place] [address]     Procedures and diagnostic studies:  CARDIAC CATHETERIZATION  Result Date: 06/27/2020 Successful OCT guided PCI of severe complex stenosis in the proximal LAD, reducing 90% stenosis to 0% with a 2.75x22 mm Resolute Onyx DES OCT findings: Lesion MLA 1.65 square mm, minimal calcification Post-PCI: Minimal stent area 4.93 square mm, full stent expansion and apposition  VAS [place] UPPER EXTREMITY ARTERIAL DUPLEX  Result Date: 06/28/2020 UPPER EXTREMITY DUPLEX STUDY Indications: Patient complains of Hematoma. History:     Patient has a history of catheterization via right radial artery.  Comparison Study: No prior study on file Performing Technologist: Sharion Dove RVS  Examination Guidelines: A complete evaluation includes B-mode imaging, spectral Doppler, color Doppler, and power Doppler as needed of all accessible portions of each vessel. Bilateral testing is considered an integral part of a complete examination. Limited examinations for reoccurring indications may be performed as noted.   Summary:  Right: No evidence of pseudoaneurysm or AV fistula noted in the        right upper extremity. *See table(s) above for measurements and observations.    Preliminary     Medications:   . sodium  chloride   Intravenous Once  . acetaminophen  650 mg Oral Once  . aspirin  81 mg Oral Daily  . atorvastatin  80 mg Oral q1800  . calcitRIOL  0.5 mcg Oral Daily  . Chlorhexidine Gluconate Cloth  6 each Topical Daily  . cinacalcet  30 mg Oral Q breakfast  . clopidogrel  75 mg Oral Daily  . darbepoetin (ARANESP) injection - NON-DIALYSIS  100 mcg Subcutaneous Q Sat-1800  . feeding supplement  237 mL Oral TID BM  .  fidaxomicin  200 mg Oral BID  . furosemide  80 mg Intravenous Daily  . gentamicin cream  1 application Topical Daily  . metoprolol succinate  25 mg Oral Daily  . multivitamin  1 tablet Oral QHS  . pantoprazole  40 mg Oral Daily  . potassium chloride  10 mEq Oral Daily  . sodium chloride flush  3 mL Intravenous Once  . sodium chloride flush  3 mL Intravenous Q12H  . sodium chloride flush  3 mL Intravenous Q12H  . sodium chloride flush  3 mL Intravenous Q12H  . sodium chloride flush  3 mL Intravenous Q12H   Continuous Infusions: . sodium chloride 250 mL (06/27/20 1417)  . sodium chloride    . levETIRAcetam 500 mg (06/29/20 0420)     LOS: 10 days   Geradine Girt  [facility]   How to contact the [facility] Attending or Consulting provider [place] or covering provider during after hours [place], for this patient?  1. Check the care team in [facility] and look for a) attending/consulting [facility] provider listed and b) the [facility] team listed 2. Log into [email or web address] and use [facility]'s universal password to access. If you do not have the password, please contact the hospital operator. 3. Locate the [facility] provider you are looking for under [facility] and page to a number that you can be directly reached. 4. If you still have difficulty reaching the provider, please page the [facility] (Director on Call) for the Hospitalists listed on amion for assistance.  06/29/2020, 12:04 PM

## 2020-06-29 NOTE — Plan of Care (Signed)

## 2020-06-30 ENCOUNTER — Other Ambulatory Visit: Payer: Self-pay | Admitting: Internal Medicine

## 2020-06-30 ENCOUNTER — Telehealth: Payer: Self-pay | Admitting: Cardiovascular Disease

## 2020-06-30 ENCOUNTER — Encounter (HOSPITAL_COMMUNITY): Payer: Self-pay | Admitting: Cardiovascular Disease

## 2020-06-30 DIAGNOSIS — E876 Hypokalemia: Secondary | ICD-10-CM | POA: Diagnosis not present

## 2020-06-30 DIAGNOSIS — E871 Hypo-osmolality and hyponatremia: Secondary | ICD-10-CM | POA: Diagnosis not present

## 2020-06-30 DIAGNOSIS — I5021 Acute systolic (congestive) heart failure: Secondary | ICD-10-CM | POA: Diagnosis not present

## 2020-06-30 DIAGNOSIS — I255 Ischemic cardiomyopathy: Secondary | ICD-10-CM | POA: Diagnosis not present

## 2020-06-30 LAB — RENAL FUNCTION PANEL
Albumin: 1.4 g/dL — ABNORMAL LOW (ref 3.5–5.0)
Anion gap: 11 (ref 5–15)
BUN: 30 mg/dL — ABNORMAL HIGH (ref 8–23)
CO2: 21 mmol/L — ABNORMAL LOW (ref 22–32)
Calcium: 8.2 mg/dL — ABNORMAL LOW (ref 8.9–10.3)
Chloride: 99 mmol/L (ref 98–111)
Creatinine, Ser: 4.75 mg/dL — ABNORMAL HIGH (ref 0.44–1.00)
GFR, Estimated: 9 mL/min — ABNORMAL LOW (ref >60)
Glucose, Bld: 114 mg/dL — ABNORMAL HIGH (ref 70–99)
Phosphorus: 3.4 mg/dL (ref 2.5–4.6)
Potassium: 4 mmol/L (ref 3.5–5.1)
Sodium: 131 mmol/L — ABNORMAL LOW (ref 135–145)

## 2020-06-30 LAB — MAGNESIUM: Magnesium: 1.6 mg/dL — ABNORMAL LOW (ref 1.7–2.4)

## 2020-06-30 LAB — CBC
HCT: 29.4 % — ABNORMAL LOW (ref 36.0–46.0)
Hemoglobin: 9.8 g/dL — ABNORMAL LOW (ref 12.0–15.0)
MCH: 30 pg (ref 26.0–34.0)
MCHC: 33.3 g/dL (ref 30.0–36.0)
MCV: 89.9 fL (ref 80.0–100.0)
Platelets: 227 10*3/uL (ref 150–400)
RBC: 3.27 MIL/uL — ABNORMAL LOW (ref 3.87–5.11)
RDW: 17.3 % — ABNORMAL HIGH (ref 11.5–15.5)
WBC: 10.4 10*3/uL (ref 4.0–10.5)
nRBC: 0 % (ref 0.0–0.2)

## 2020-06-30 MED ORDER — ASPIRIN 81 MG PO CHEW: 81.0000 mg | CHEWABLE_TABLET | Freq: Every day | ORAL | Status: DC

## 2020-06-30 MED ORDER — MAGNESIUM SULFATE 2 GM/50ML IV SOLN
2.0000 g | Freq: Once | INTRAVENOUS | Status: AC
  Administered 2020-06-30: 2 g via INTRAVENOUS
  Filled 2020-06-30: qty 50

## 2020-06-30 MED ORDER — TORSEMIDE 100 MG PO TABS
100.0000 mg | ORAL_TABLET | Freq: Two times a day (BID) | ORAL | Status: DC
  Administered 2020-06-30 – 2020-07-04 (×9): 100 mg via ORAL
  Filled 2020-06-30 (×2): qty 1
  Filled 2020-06-30 (×4): qty 5
  Filled 2020-06-30: qty 1
  Filled 2020-06-30: qty 5
  Filled 2020-06-30 (×2): qty 1

## 2020-06-30 MED ORDER — LEVETIRACETAM 500 MG PO TABS: 500.0000 mg | ORAL_TABLET | Freq: Two times a day (BID) | ORAL | 1 refills | Status: DC

## 2020-06-30 MED ORDER — PANTOPRAZOLE SODIUM 40 MG PO TBEC: 40.0000 mg | DELAYED_RELEASE_TABLET | Freq: Every day | ORAL | 1 refills | Status: DC

## 2020-06-30 MED ORDER — LEVETIRACETAM 500 MG PO TABS
500.0000 mg | ORAL_TABLET | Freq: Two times a day (BID) | ORAL | Status: DC
  Administered 2020-06-30 – 2020-07-04 (×9): 500 mg via ORAL
  Filled 2020-06-30 (×9): qty 1

## 2020-06-30 MED ORDER — METOPROLOL SUCCINATE ER 25 MG PO TB24: 25.0000 mg | ORAL_TABLET | Freq: Every day | ORAL | 0 refills | Status: DC

## 2020-06-30 MED ORDER — CLOPIDOGREL BISULFATE 75 MG PO TABS: 75.0000 mg | ORAL_TABLET | Freq: Every day | ORAL | 1 refills | Status: DC

## 2020-06-30 MED ORDER — ATORVASTATIN CALCIUM 80 MG PO TABS: 80.0000 mg | ORAL_TABLET | Freq: Every day | ORAL | 0 refills | Status: DC

## 2020-06-30 MED ORDER — MAGNESIUM OXIDE 400 (241.3 MG) MG PO TABS
400.0000 mg | ORAL_TABLET | Freq: Once | ORAL | Status: AC
  Administered 2020-06-30: 400 mg via ORAL
  Filled 2020-06-30: qty 1

## 2020-06-30 MED FILL — Heparin Sod (Porcine)-NaCl IV Soln 1000 Unit/500ML-0.9%: INTRAVENOUS | Qty: 500 | Status: AC

## 2020-06-30 NOTE — Plan of Care (Signed)
°  Problem: Education: Goal: Knowledge of General Education information will improve Description: Including pain rating scale, medication(s)/side effects and non-pharmacologic comfort measures Outcome: Progressing   Problem: Health Behavior/Discharge Planning: Goal: Ability to manage health-related needs will improve Outcome: Progressing   Problem: Clinical Measurements: Goal: Ability to maintain clinical measurements within normal limits will improve Outcome: Progressing Goal: Will remain free from infection Outcome: Progressing Goal: Diagnostic test results will improve Outcome: Progressing Goal: Respiratory complications will improve Outcome: Progressing Goal: Cardiovascular complication will be avoided Outcome: Progressing   Problem: Activity: Goal: Risk for activity intolerance will decrease Outcome: Progressing   Problem: Nutrition: Goal: Adequate nutrition will be maintained Outcome: Progressing   Problem: Nutrition: Goal: Adequate nutrition will be maintained Outcome: Progressing   Problem: Coping: Goal: Level of anxiety will decrease Outcome: Progressing   Problem: Elimination: Goal: Will not experience complications related to bowel motility Outcome: Progressing Goal: Will not experience complications related to urinary retention Outcome: Progressing   Problem: Pain Managment: Goal: General experience of comfort will improve Outcome: Progressing   Problem: Safety: Goal: Ability to remain free from injury will improve Outcome: Progressing   Problem: Skin Integrity: Goal: Risk for impaired skin integrity will decrease Outcome: Progressing

## 2020-06-30 NOTE — Care Management Important Message (Signed)
Important Message  Patient Details  Name: Amy [name] MRN: [id number] Date of Birth: 20-Feb-1951   Medicare Important Message Given:  Yes     [name] 06/30/2020, 2:45 PM

## 2020-06-30 NOTE — Progress Notes (Signed)
Patient ID: Amy [name], female   DOB: August 23, 1950, 70 y.o.   MRN: [id number]  [facility] Progress Note    Subjective:    Feels well this morning.  NO new complaints   Objective:   BP 116/69 (BP Location: Left Arm)   Pulse (!) 102   Temp 98.2 F (36.8 C) (Oral)   Resp 14   Ht 5\' 4"  (1.626 m)   Wt 55.3 kg   SpO2 97%   BMI 20.93 kg/m   Intake/Output: I/O last 3 completed shifts: In: 9142 [P.O.:720; Blood:315; AOZHY:8657; IV Piggyback:100] Out: 9613 [Urine:50; Other:9563]   Intake/Output this shift:  Total I/O In: -  Out: 1 [Stool:1] Weight change: -2.1 kg  Physical Exam: Gen: NAD CVS: no rub Resp: CTA Abd: +BS, soft, NT/ND Ext: 1+ ankle edema bilaterally  Labs: BMET Recent Labs  Lab 06/24/20 0102 06/25/20 0104 06/25/20 1123 06/26/20 0107 06/27/20 0026 06/28/20 0121 06/29/20 0111 06/30/20 0046  NA 129* 131*  --  129* 130* 130* 131* 131*  K 3.2* 3.5  --  4.1 3.5 4.0 3.7 4.0  CL 97* 98  --  97* 96* 98 97* 99  CO2 22 23  --  21* 23 22 23  21*  GLUCOSE 106* 110*  --  125* 104* 108* 100* 114*  BUN 28* 27*  --  30* 29* 32* 29* 30*  CREATININE 4.19* 4.02* 4.05* 4.02* 4.19* 4.63* 4.65* 4.75*  ALBUMIN 1.7* 1.4*  --  1.6* 1.6*  --  1.4* 1.4*  CALCIUM 7.9* 7.9*  --  8.1* 8.4* 8.2* 8.3* 8.2*  PHOS  --   --   --   --  3.1  --  3.9 3.4   CBC Recent Labs  Lab 06/24/20 0102 06/25/20 0104 06/25/20 1123 06/26/20 0107 06/27/20 0026 06/27/20 1949 06/28/20 0121 06/29/20 0111 06/29/20 1042 06/30/20 0046  WBC 9.1 7.8   < > 10.5 8.7 9.8 8.8 7.3  --  10.4  NEUTROABS 6.7 5.6  --  8.4* 6.3  --   --   --   --   --   HGB 10.2* 8.7*   < > 8.8* 8.1* 7.5* 7.5* 6.7* 11.0* 9.8*  HCT 29.8* 24.0*   < > 25.6* 24.0* 22.8* 22.3* 19.1* 33.1* 29.4*  MCV 87.6 86.3   < > 88.9 88.9 91.2 90.3 89.7  --  89.9  PLT 278 216   < > 271 246 236 238 238  --  227   < > = values in this interval not displayed.      Medications:    . sodium chloride   Intravenous Once  .  acetaminophen  650 mg Oral Once  . aspirin  81 mg Oral Daily  . atorvastatin  80 mg Oral q1800  . calcitRIOL  0.5 mcg Oral Daily  . Chlorhexidine Gluconate Cloth  6 each Topical Daily  . cinacalcet  30 mg Oral Q breakfast  . clopidogrel  75 mg Oral Daily  . darbepoetin (ARANESP) injection - NON-DIALYSIS  100 mcg Subcutaneous Q Sat-1800  . feeding supplement  237 mL Oral TID BM  . furosemide  80 mg Intravenous Daily  . gentamicin cream  1 application Topical Daily  . metoprolol succinate  25 mg Oral Daily  . multivitamin  1 tablet Oral QHS  . pantoprazole  40 mg Oral Daily  . potassium chloride  10 mEq Oral Daily  . rifaximin  550 mg Oral BID  . sodium chloride flush  3 mL Intravenous Once  . sodium chloride flush  3 mL Intravenous Q12H  . sodium chloride flush  3 mL Intravenous Q12H     Assessment/ Plan:   1. Multivessel CAD with reduced EF- s/p PCI by Cardiology of LAD with DES.  On asa and plavix for 1 year per Cardiology. 2. ESRD - stable on PD and doing well 3. Anemia: dropped to 6.7 but repeat today over 11 s/p 1 unit PRBC's.  Will need to recheck. 4. CKD-MBD: continue with home meds 5. Nutrition: renal diet 6. Hypertension: stable.  BP low and ok to start losartan 25 mg as outpatient per Cardiology's recommendation.  7. Disposition- stable for discharge from renal standpoint.  Follow up with Dr. Candiss Norse her primary nephrologist and resume home PD orders.   Donetta Potts, MD [place] Pager [phone] 06/30/2020, 9:05 AM

## 2020-06-30 NOTE — Progress Notes (Addendum)
Patient ID: Amy [name], female   DOB: 1950-06-23, 70 y.o.   MRN: [id number]     Advanced Heart Failure Rounding Note  PCP-Cardiologist: Ida Rogue, MD   Subjective:    2/25 S/P PCI LAD . RUE hematoma.  2/26 Doppler no aneurysm, AV fistula, or compartment syndrome.  2/27 Received 1UPRBC  Complaining of fatigue. Denies SOB.   Objective:   Weight Range: 55.3 kg Body mass index is 20.93 kg/m.   Vital Signs:   Temp:  [97.7 F (36.5 C)-98.4 F (36.9 C)] 98.2 F (36.8 C) (02/28 0726) Pulse Rate:  [78-106] 102 (02/28 0726) Resp:  [11-21] 14 (02/28 0726) BP: (104-125)/(61-75) 116/69 (02/28 0726) SpO2:  [95 %-100 %] 97 % (02/28 0726) Weight:  [55.3 kg] 55.3 kg (02/28 0407) Last BM Date: 06/29/20  Weight change: Filed Weights   06/28/20 0415 06/29/20 0400 06/30/20 0407  Weight: 56.5 kg 57.4 kg 55.3 kg    Intake/Output:   Intake/Output Summary (Last 24 hours) at 06/30/2020 0753 Last data filed at 06/30/2020 0727 Gross per 24 hour  Intake 8802 ml  Output 9564 ml  Net -762 ml      Physical Exam   General:   No resp difficulty HEENT: normal Neck: supple. no JVD. Carotids 2+ bilat; no bruits. No lymphadenopathy or thryomegaly appreciated. Cor: PMI nondisplaced. Regular rate & rhythm. No rubs, gallops or murmurs. Lungs: clear Abdomen: soft, nontender, nondistended. No hepatosplenomegaly. No bruits or masses. Good bowel sounds. Extremities: no cyanosis, clubbing, rash, edema. RUE ecchymotic  Neuro: alert & orientedx3, cranial nerves grossly intact. moves all 4 extremities w/o difficulty. Affect pleasant   Telemetry  NSR 80s   Labs    CBC Recent Labs    06/29/20 0111 06/29/20 1042 06/30/20 0046  WBC 7.3  --  10.4  HGB 6.7* 11.0* 9.8*  HCT 19.1* 33.1* 29.4*  MCV 89.7  --  89.9  PLT 238  --  130   Basic Metabolic Panel Recent Labs    06/29/20 0111 06/30/20 0046  NA 131* 131*  K 3.7 4.0  CL 97* 99  CO2 23 21*  GLUCOSE 100* 114*  BUN 29* 30*   CREATININE 4.65* 4.75*  CALCIUM 8.3* 8.2*  MG  --  1.6*  PHOS 3.9 3.4   Liver Function Tests Recent Labs    06/29/20 0111 06/30/20 0046  ALBUMIN 1.4* 1.4*   No results for input(s): LIPASE, AMYLASE in the last 72 hours. Cardiac Enzymes No results for input(s): CKTOTAL, CKMB, CKMBINDEX, TROPONINI in the last 72 hours.  BNP: BNP (last 3 results) Recent Labs    06/25/20 0104 06/26/20 0107 06/27/20 0026  BNP 1,416.4* 1,112.0* 1,371.7*    ProBNP (last 3 results) No results for input(s): PROBNP in the last 8760 hours.   D-Dimer No results for input(s): DDIMER in the last 72 hours. Hemoglobin A1C No results for input(s): HGBA1C in the last 72 hours. Fasting Lipid Panel No results for input(s): CHOL, HDL, LDLCALC, TRIG, CHOLHDL, LDLDIRECT in the last 72 hours. Thyroid Function Tests No results for input(s): TSH, T4TOTAL, T3FREE, THYROIDAB in the last 72 hours.  Invalid input(s): FREET3  Other results:   Imaging    No results found.   Medications:     Scheduled Medications: . sodium chloride   Intravenous Once  . acetaminophen  650 mg Oral Once  . aspirin  81 mg Oral Daily  . atorvastatin  80 mg Oral q1800  . calcitRIOL  0.5 mcg Oral Daily  .  Chlorhexidine Gluconate Cloth  6 each Topical Daily  . cinacalcet  30 mg Oral Q breakfast  . clopidogrel  75 mg Oral Daily  . darbepoetin (ARANESP) injection - NON-DIALYSIS  100 mcg Subcutaneous Q Sat-1800  . feeding supplement  237 mL Oral TID BM  . furosemide  80 mg Intravenous Daily  . gentamicin cream  1 application Topical Daily  . metoprolol succinate  25 mg Oral Daily  . multivitamin  1 tablet Oral QHS  . pantoprazole  40 mg Oral Daily  . potassium chloride  10 mEq Oral Daily  . rifaximin  550 mg Oral BID  . sodium chloride flush  3 mL Intravenous Once  . sodium chloride flush  3 mL Intravenous Q12H  . sodium chloride flush  3 mL Intravenous Q12H    Infusions: . sodium chloride    . levETIRAcetam 500  mg (06/30/20 0518)    PRN Medications: sodium chloride, acetaminophen, butalbital-acetaminophen-caffeine, diazepam, Gerhardt's butt cream, dianeal solution for CAPD/[name] with heparin, ondansetron (ZOFRAN) IV, sodium chloride flush, traZODone   Assessment/Plan   1. Seizure: Cause of initial presentation.  Thought to be due to electrolyte abnormalities due to C difficile diarrhea. EEG showed post-ictal state, was seen by neurology.  - She is on Keppra per primary service.  - No seizure over the weekend.  2. C difficile diarrhea: Completed fidaxomicin course, failed course of oral vancomycin.  3. Recent COVID-19 infection.  4. ESRD: She does PD.  5. CAD: NSTEMI with peak HS-TnI 1978.  Cath with serial proximal-mid LAD severe stenoses, 85% proximal ramus stenosis.  She was evaluated by TCTS and discussed with Dr. Orvan Seen.  I think that with her current comorbidities and deconditioning, she would be a poor CABG candidate. Unable to do MRI viability study given ESRD on PD.  - S/P  PCI to LAD. RUE hematoma. Will elevate RUE. No chest pain.  - Continue ASA 81 and Plavix  - Continue high dose statin.  6. Acute on chronic systolic CHF: Echo with EF 25-30%, LAD territory wall motion abnormalities.  - Volume managed by nephrology with PD and on lasix 80 mg po daily.   - Continue beta blocker, Toprol Xl 25 mg daily.  - SBP stable, will not add additional HF medications at this time.  7. Anemia -RUE hematoma -2/27 hgb 6.7 received blood with appropriate rise -->9.8 8. Deconditioning.  Consult PT  Length of Stay: [place], NP  06/30/2020, 7:53 AM  Advanced Heart Failure Team Pager [phone] (M-F; 7a - 4p)  Please contact [facility] for night-coverage after hours (4p -7a ) and weekends on [email or web address]  Patient seen with NP, agree with the above note.   Feels weak, could stand but minimal walking. She is now s/p PCI to LAD.  Developed RUE extensive ecchymosis post-procedure.    General:  NAD Neck: No JVD, no thyromegaly or thyroid nodule.  Lungs: Clear to auscultation bilaterally with normal respiratory effort. CV: Nondisplaced PMI.  Heart regular S1/S2, no S3/S4, no murmur. 1+ edema 1/2 to knees bilaterally.   Abdomen: Soft, nontender, no hepatosplenomegaly, no distention.  Skin: Intact without lesions or rashes.  Neurologic: Alert and oriented x 3.  Psych: Normal affect. Extremities: No clubbing or cyanosis. Right forearm extensive ecchymosis, 2+ right radial pulse.  HEENT: Normal.   Stable post-PCI.  Continue ASA, Plavix, statin.   LV EF 25-30% by echo, volume controlled by PD and supplemental Lasix per nephrology.  Continue Toprol XL 25 mg daily.  Does not have much BP room currently for additional meds.   Ecchymosis of right forearm stable, good radial pulse.    Deconditioned, will need short term rehab.  Social work to start screening.   Loralie Champagne 06/30/2020 11:00 AM

## 2020-06-30 NOTE — Telephone Encounter (Signed)
Admitted

## 2020-06-30 NOTE — Discharge Summary (Signed)
Physician Discharge Summary  Amy [name]:[id number] DOB: 07-07-1950 DOA: 06/19/2020  PCP: [name], MD  Admit date: 06/19/2020 Discharge date: 06/30/2020  Admitted From: home Discharge disposition: home vs SNF vs CIR  Recommendations for Outpatient Follow-Up:   1. Continue PD and labs per nephrology 2. Will need ASA plus plavix for at least 1 year 3. Follow up with neurology outpatient for seizure management 4. Holding Wellbutrin due to seizures   Discharge Diagnosis:   Active Problems:   Anxiety   Fever blister   Essential hypertension   Hypokalemia   Hyponatremia   HLD (hyperlipidemia)   ESRD on dialysis (HCC)   C. difficile diarrhea   C. difficile colitis   Seizure (HCC)   Pressure injury of skin   Protein-calorie malnutrition, severe   Non-ST elevation (NSTEMI) myocardial infarction [facility])   Ischemic cardiomyopathy    Discharge Condition: stable  Diet recommendation: DYS 3- renal diet  Wound care: None.  Code status: Full.   History of Present Illness:   Amy [name] is a 70 y.o. female with medical history significant of ESRD on PD, HTN, multiple thyroid nodules. She had a hip fracture in December s/p ORIF. Recently hospitalized 1/25/-05/29/20 for watery diarrhea, hyponatremia, hypokalemia. She was found to be covid 19 positive but was not treated with no signs of respiratory involvement. She did test positive for C. Diff by PCR and was started on oral Vancomycin with a 9 day Rx at discharge which she was unable to get filled. By her report she was treated with clindamycin. She has continued to have copious watery diarrhea with increased weakness. She was observed to have a very brief episode of rightward gaze with slumped head that lasted approximately 1 min. EMS was activated and transported the patient to [facility] for evaluation.      Hospital Course by Problem:   Seizure with postictal state : due to severe electrolyte abnormalities  including hyponatremia, hypomagnesemia, hypokalemia caused by C. difficile diarrhea  - MRI negative -EEG shows postictal state, seen by neurology  -Potassium and magnesium now stable after aggressive replacement, has been hydrated with IV fluids - on Keppra  -Neuro recommends DC on Keppra with Neuro follow up -d/c wellbutrin for now-- outpatient follow up  C. difficile infection present on admission. Being treated with Dificid as she seems to have failed 10-day course of vancomycin (not sure she took?). -finished course in hospital as abx to 2/27  Recent COVID-19 incidental infection. Supportive care.  ESRD (40yrs) . On PD. Renal following  Anemia of chronic disease. Anemia panel suggestive of chronic disease, no signs of active bleeding, 1 unit of packed RBC transfusion on 06/22/2020 and again 2/27- suspect low on 2/27 was incorrect as her Hgb increased more than expected  sinus tachycardia/ NSTEMI.  -CTA chest and leg ultrasound unremarkable - echocardiogram shows a depressed EF of 25% with typical wall motion abnormalities -s/p cath: Severe multivessel CAD with diffuse 80%, 95% and 80% proximal LAD stenosis, 90% diagonal stenosis; 85% focal ramus intermediate stenosis; and 50% stenoses in the AV groove circumflex. -CVTS: not a candidate for CABG, unable to get cardiac MRI -cath lab on 2/25 for intervention: stent placed-- aSA/plavix for min of 1 year  Chronic systolic CHF with EF 25 to 30%.  -Fluid removal through PD. -if BP allows, can start low dose losartan as an outpatient    Pressure Injury 06/20/20 Coccyx Medial Stage 1 -  Intact skin with non-blanchable  redness of a localized area usually over a bony prominence. (Active)  06/20/20 1500  Location: Coccyx  Location Orientation: Medial  Staging: Stage 1 -  Intact skin with non-blanchable redness of a localized area usually over a bony prominence.  Wound Description (Comments):   Present on Admission:  Yes       Medical Consultants:   Cards Renal CVTS   Discharge Exam:   Vitals:   06/30/20 1053 06/30/20 1108  BP: 115/74 114/73  Pulse: (!) 121 (!) 101  Resp:    Temp:    SpO2:     Vitals:   06/30/20 0726 06/30/20 1025 06/30/20 1053 06/30/20 1108  BP: 116/69 114/73 115/74 114/73  Pulse: (!) 102  (!) 121 (!) 101  Resp: 14 18    Temp: 98.2 F (36.8 C) 98.1 F (36.7 C)    TempSrc: Oral Oral    SpO2: 97% 96%    Weight:      Height:        General exam: Appears calm and comfortable.   The results of significant diagnostics from this hospitalization (including imaging, microbiology, ancillary and laboratory) are listed below for reference.     Procedures and Diagnostic Studies:   MR BRAIN WO CONTRAST  Result Date: 06/20/2020 CLINICAL DATA:  Left-sided neglect and confusion.  Seizure activity. EXAM: MRI HEAD WITHOUT CONTRAST TECHNIQUE: Multiplanar, multiecho pulse sequences of the brain and surrounding structures were obtained without intravenous contrast. COMPARISON:  Head CT 06/19/2020 FINDINGS: Brain: There is no evidence of an acute infarct, intracranial hemorrhage, mass, midline shift, or extra-axial fluid collection. T2 hyperintensities in the cerebral white matter bilaterally are nonspecific but compatible with mild chronic small vessel ischemic disease. Chronic lacunar infarcts are noted in the right caudate nucleus and posterior right corona radiata. A chronic lacunar infarct is also noted in the right pons. There is moderate generalized cerebral atrophy. Vascular: Major intracranial vascular flow voids are preserved. Skull and upper cervical spine: Unremarkable bone marrow signal. Moderate to severe disc degeneration at C4-5 and C5-6 and severe left facet arthrosis at C4-5 with grade 1 anterolisthesis. Sinuses/Orbits: Mild bilateral ethmoid air cell mucosal thickening. Clear mastoid air cells. Unremarkable orbits. Other: None. IMPRESSION: 1. No acute intracranial  abnormality. 2. Chronic small-vessel ischemia with multiple chronic lacunar infarcts. Electronically Signed   By: Logan Bores M.D.   On: 06/20/2020 10:09   EEG adult  Result Date: 06/19/2020 [name], MD     06/19/2020  7:42 PM Patient Name: Amy [name] MRN: [id number] Epilepsy Attending: [name] Referring Physician/Provider: Anibal Henderson, NP Date: 06/19/2020 Duration: Patient history: 70yo F with sudden onset speech disturbance, right gaze deviation and seizure like episode. EEG to evaluate for seizure  Level of alertness: Awake AEDs during EEG study: LEV Technical aspects: This EEG study was done with scalp electrodes positioned according to the 10-20 International system of electrode placement. Electrical activity was acquired at a sampling rate of 500Hz  and reviewed with a high frequency filter of 70Hz  and a low frequency filter of 1Hz . EEG data were recorded continuously and digitally stored. Description: No clear posterior dominant rhythm was seen.  EEG showed continuous generalized and maximal bifrontal 5-9hz  theta-delta activity as well as intermittent 2-3hz  delta slowing. Hyperventilation and photic stimulation were not performed.   ABNORMALITY -Continuous slow, generalized and maximal bifrontal region IMPRESSION: This study is suggestive of cortical dysfunction in bifrontal region which is non specific but could be seen due to underlying structural abnormality, post-ictal  state. There is also mild to moderate diffuse encephalopathy, nonspecific etiology. No seizures or epileptiform discharges were seen throughout the recording. [name]   CT HEAD CODE STROKE WO CONTRAST  Result Date: 06/19/2020 CLINICAL DATA:  Code stroke.  Acute stroke presentation. EXAM: CT HEAD WITHOUT CONTRAST TECHNIQUE: Contiguous axial images were obtained from the base of the skull through the vertex without intravenous contrast. COMPARISON:  12/16/2019 FINDINGS: Brain: Generalized atrophy. No  focal infarction seen affecting the brainstem or cerebellum. Cerebral hemispheres show chronic small-vessel ischemic changes of the hemispheric white matter and the basal ganglia regions. No sign of acute infarction, mass lesion, hemorrhage hydrocephalus or extra-axial collection. Vascular: There is atherosclerotic calcification of the major vessels at the base of the brain. Skull: Negative Sinuses/Orbits: Clear/normal Other: None ASPECTS ([place] Stroke Program Early CT Score) - Ganglionic level infarction (caudate, lentiform nuclei, internal capsule, insula, M1-M3 cortex): 7 - Supraganglionic infarction (M4-M6 cortex): 3 Total score (0-10 with 10 being normal): 10 IMPRESSION: 1. No acute finding by CT. Atrophy and chronic small-vessel ischemic changes of the cerebral hemispheric white matter and basal ganglia regions. 2. ASPECTS is 10. 3. These results were communicated to Dr. Charlsie Merles At 4:25 pmon 2/17/2022by text page via the [facility] messaging system. Electronically Signed   By: Nelson Chimes M.D.   On: 06/19/2020 16:25   CT ANGIO HEAD CODE STROKE  Result Date: 06/19/2020 CLINICAL DATA:  Left-sided neglect. Seizure activity. Acute stroke suspected. EXAM: CT ANGIOGRAPHY HEAD AND NECK TECHNIQUE: Multidetector CT imaging of the head and neck was performed using the standard protocol during bolus administration of intravenous contrast. Multiplanar CT image reconstructions and MIPs were obtained to evaluate the vascular anatomy. Carotid stenosis measurements (when applicable) are obtained utilizing NASCET criteria, using the distal internal carotid diameter as the denominator. CONTRAST:  73mL OMNIPAQUE IOHEXOL 350 MG/ML SOLN COMPARISON:  Head CT earlier same day FINDINGS: CTA NECK FINDINGS Aortic arch: Mild atherosclerotic change of the aortic arch. Branching pattern is normal without origin stenosis. Right carotid system: Common carotid artery widely patent to the bifurcation. Carotid bifurcation shows minimal plaque  but there is no stenosis. Cervical ICA widely patent. Left carotid system: Common carotid artery widely patent to the bifurcation. Mild plaque at the bifurcation and ICA bulb but no stenosis. Cervical ICA widely patent. Vertebral arteries: Large vertebral arteries widely patent at their origins and through the cervical region to the foramen magnum. Skeleton: Mid cervical spondylosis which could be painful. Other neck: No adenopathy. Enlarged heterogeneous thyroid gland. Largest nodule in the isthmus measures up to 2 cm in diameter. Upper chest: Mild pleural and parenchymal scarring at the lung apices. Review of the MIP images confirms the above findings CTA HEAD FINDINGS Anterior circulation: Both internal carotid arteries are patent through the skull base and siphon regions. There is ordinary atherosclerotic calcification in the carotid siphon regions but without stenosis greater than 30% suspected. The anterior and middle cerebral vessels are patent. No large or medium vessel occlusion identified. Distal branch vessels do show atherosclerotic irregularity. Posterior circulation: Both vertebral arteries widely patent through the foramen magnum to the basilar. There is focal plaque of the left vertebral artery V4 segment with stenosis estimated at 30-50%. No vertebral stenosis. Posterior circulation branch vessels are patent. Distal branch vessels do show atherosclerotic irregularity. Venous sinuses: Patent and normal. Anatomic variants: None significant. Review of the MIP images confirms the above findings IMPRESSION: 1. No large or medium vessel occlusion. Widespread intracranial distal vessel atherosclerotic irregularity. 2. Minimal  atherosclerotic change at both carotid bifurcations but without stenosis. 3. 30-50% stenosis of the left vertebral artery V4 segment. 4. Enlarged heterogeneous thyroid gland. Largest nodule in the isthmus measures up to 2 cm in diameter. Recommend thyroid US (ref: J Am Coll Radiol.  2015 Feb;12(2): 143-50). Aortic Atherosclerosis (ICD10-I70.0). Electronically Signed   By: Nelson Chimes M.D.   On: 06/19/2020 17:08   CT ANGIO NECK CODE STROKE  Result Date: 06/19/2020 CLINICAL DATA:  Left-sided neglect. Seizure activity. Acute stroke suspected. EXAM: CT ANGIOGRAPHY HEAD AND NECK TECHNIQUE: Multidetector CT imaging of the head and neck was performed using the standard protocol during bolus administration of intravenous contrast. Multiplanar CT image reconstructions and MIPs were obtained to evaluate the vascular anatomy. Carotid stenosis measurements (when applicable) are obtained utilizing NASCET criteria, using the distal internal carotid diameter as the denominator. CONTRAST:  53mL OMNIPAQUE IOHEXOL 350 MG/ML SOLN COMPARISON:  Head CT earlier same day FINDINGS: CTA NECK FINDINGS Aortic arch: Mild atherosclerotic change of the aortic arch. Branching pattern is normal without origin stenosis. Right carotid system: Common carotid artery widely patent to the bifurcation. Carotid bifurcation shows minimal plaque but there is no stenosis. Cervical ICA widely patent. Left carotid system: Common carotid artery widely patent to the bifurcation. Mild plaque at the bifurcation and ICA bulb but no stenosis. Cervical ICA widely patent. Vertebral arteries: Large vertebral arteries widely patent at their origins and through the cervical region to the foramen magnum. Skeleton: Mid cervical spondylosis which could be painful. Other neck: No adenopathy. Enlarged heterogeneous thyroid gland. Largest nodule in the isthmus measures up to 2 cm in diameter. Upper chest: Mild pleural and parenchymal scarring at the lung apices. Review of the MIP images confirms the above findings CTA HEAD FINDINGS Anterior circulation: Both internal carotid arteries are patent through the skull base and siphon regions. There is ordinary atherosclerotic calcification in the carotid siphon regions but without stenosis greater than 30%  suspected. The anterior and middle cerebral vessels are patent. No large or medium vessel occlusion identified. Distal branch vessels do show atherosclerotic irregularity. Posterior circulation: Both vertebral arteries widely patent through the foramen magnum to the basilar. There is focal plaque of the left vertebral artery V4 segment with stenosis estimated at 30-50%. No vertebral stenosis. Posterior circulation branch vessels are patent. Distal branch vessels do show atherosclerotic irregularity. Venous sinuses: Patent and normal. Anatomic variants: None significant. Review of the MIP images confirms the above findings IMPRESSION: 1. No large or medium vessel occlusion. Widespread intracranial distal vessel atherosclerotic irregularity. 2. Minimal atherosclerotic change at both carotid bifurcations but without stenosis. 3. 30-50% stenosis of the left vertebral artery V4 segment. 4. Enlarged heterogeneous thyroid gland. Largest nodule in the isthmus measures up to 2 cm in diameter. Recommend thyroid US (ref: J Am Coll Radiol. 2015 Feb;12(2): 143-50). Aortic Atherosclerosis (ICD10-I70.0). Electronically Signed   By: Nelson Chimes M.D.   On: 06/19/2020 17:08     Labs:   Basic Metabolic Panel: Recent Labs  Lab 06/24/20 0102 06/25/20 0104 06/25/20 1123 06/26/20 0107 06/27/20 0026 06/28/20 0121 06/29/20 0111 06/30/20 0046  NA 129* 131*  --  129* 130* 130* 131* 131*  K 3.2* 3.5  --  4.1 3.5 4.0 3.7 4.0  CL 97* 98  --  97* 96* 98 97* 99  CO2 22 23  --  21* 23 22 23  21*  GLUCOSE 106* 110*  --  125* 104* 108* 100* 114*  BUN 28* 27*  --  30* 29* 32*  29* 30*  CREATININE 4.19* 4.02*   < > 4.02* 4.19* 4.63* 4.65* 4.75*  CALCIUM 7.9* 7.9*  --  8.1* 8.4* 8.2* 8.3* 8.2*  MG 1.7 1.5*  --  1.8 1.8  --   --  1.6*  PHOS  --   --   --   --  3.1  --  3.9 3.4   < > = values in this interval not displayed.   GFR Estimated Creatinine Clearance: 9.4 mL/min (A) (by C-G formula based on SCr of 4.75 mg/dL  (H)). Liver Function Tests: Recent Labs  Lab 06/24/20 0102 06/25/20 0104 06/26/20 0107 06/27/20 0026 06/29/20 0111 06/30/20 0046  AST 25 22 20 19   --   --   ALT 20 20 18 21   --   --   ALKPHOS 90 80 91 77  --   --   BILITOT 0.4 0.5 0.2* 0.3  --   --   PROT 3.9* 3.4* 3.6* 3.6*  --   --   ALBUMIN 1.7* 1.4* 1.6* 1.6* 1.4* 1.4*   No results for input(s): LIPASE, AMYLASE in the last 168 hours. Recent Labs  Lab 06/29/20 1042  AMMONIA 74*   Coagulation profile No results for input(s): INR, PROTIME in the last 168 hours.  CBC: Recent Labs  Lab 06/24/20 0102 06/25/20 0104 06/25/20 1123 06/26/20 0107 06/27/20 0026 06/27/20 1949 06/28/20 0121 06/29/20 0111 06/29/20 1042 06/30/20 0046  WBC 9.1 7.8   < > 10.5 8.7 9.8 8.8 7.3  --  10.4  NEUTROABS 6.7 5.6  --  8.4* 6.3  --   --   --   --   --   HGB 10.2* 8.7*   < > 8.8* 8.1* 7.5* 7.5* 6.7* 11.0* 9.8*  HCT 29.8* 24.0*   < > 25.6* 24.0* 22.8* 22.3* 19.1* 33.1* 29.4*  MCV 87.6 86.3   < > 88.9 88.9 91.2 90.3 89.7  --  89.9  PLT 278 216   < > 271 246 236 238 238  --  227   < > = values in this interval not displayed.   Cardiac Enzymes: No results for input(s): CKTOTAL, CKMB, CKMBINDEX, TROPONINI in the last 168 hours. BNP: Invalid input(s): POCBNP CBG: No results for input(s): GLUCAP in the last 168 hours. D-Dimer No results for input(s): DDIMER in the last 72 hours. Hgb A1c No results for input(s): HGBA1C in the last 72 hours. Lipid Profile No results for input(s): CHOL, HDL, LDLCALC, TRIG, CHOLHDL, LDLDIRECT in the last 72 hours. Thyroid function studies No results for input(s): TSH, T4TOTAL, T3FREE, THYROIDAB in the last 72 hours.  Invalid input(s): FREET3 Anemia work up No results for input(s): VITAMINB12, FOLATE, FERRITIN, TIBC, IRON, RETICCTPCT in the last 72 hours. Microbiology Recent Results (from the past 240 hour(s))  MRSA PCR Screening     Status: None   Collection Time: 06/21/20 12:34 AM   Specimen:  Nasopharyngeal  Result Value Ref Range Status   MRSA by PCR NEGATIVE NEGATIVE Final    Comment:        The GeneXpert MRSA Assay (FDA approved for NASAL specimens only), is one component of a comprehensive MRSA colonization surveillance program. It is not intended to diagnose MRSA infection nor to guide or monitor treatment for MRSA infections. Performed at [facility], [place] [address] [place], [place] [address]      Discharge Instructions:   Discharge Instructions    Amb Referral to Cardiac Rehabilitation   Complete by: As directed  Diagnosis:  NSTEMI Coronary Stents PTCA     After initial evaluation and assessments completed: Virtual Based Care may be provided alone or in conjunction with Phase 2 Cardiac Rehab based on patient barriers.: Yes   Discharge instructions   Complete by: As directed    DYS 3 diet   Increase activity slowly   Complete by: As directed    No wound care   Complete by: As directed      Allergies as of 06/30/2020      Reactions   Cephalosporins Hives, Swelling   Clarithromycin Other (See Comments)   Unknown reaction   Lunesta  [eszopiclone]    Heart racing   Penicillin V    Has patient had a PCN reaction causing immediate rash, facial/tongue/throat swelling, SOB or lightheadedness with hypotension: Yes Has patient had a PCN reaction causing severe rash involving mucus membranes or skin necrosis: No Has patient had a PCN reaction that required hospitalization No Has patient had a PCN reaction occurring within the last 10 years: No If all of the above answers are "NO", then may proceed with Cephalosporin use.   Sulfa Antibiotics    Unknown reaction      Medication List    STOP taking these medications   buPROPion 75 MG tablet Commonly known as: WELLBUTRIN   cloNIDine 0.2 MG tablet Commonly known as: CATAPRES   esomeprazole 20 MG capsule Commonly known as: [place] Replaced by: pantoprazole 40 MG tablet   hydrALAZINE 50 MG  tablet Commonly known as: APRESOLINE   losartan 100 MG tablet Commonly known as: COZAAR   metoprolol tartrate 50 MG tablet Commonly known as: LOPRESSOR   sodium bicarbonate 650 MG tablet   zolpidem 10 MG tablet Commonly known as: AMBIEN     TAKE these medications   aspirin 81 MG chewable tablet Chew 1 tablet (81 mg total) by mouth daily.   atorvastatin 80 MG tablet Commonly known as: LIPITOR Take 1 tablet (80 mg total) by mouth daily at 6 PM.   butalbital-acetaminophen-caffeine 50-325-40 MG tablet Commonly known as: FIORICET TAKE 1 TO 2 TABLETS BY MOUTH EVERY 4 HOURS AS NEEDED FOR HEADACHE What changed: See the new instructions.   calcitRIOL 0.5 MCG capsule Commonly known as: ROCALTROL Take 0.5 mcg by mouth daily.   cinacalcet 30 MG tablet Commonly known as: SENSIPAR Take 30 mg by mouth daily.   clopidogrel 75 MG tablet Commonly known as: PLAVIX Take 1 tablet (75 mg total) by mouth daily.   levETIRAcetam 500 MG tablet Commonly known as: Keppra Take 1 tablet (500 mg total) by mouth 2 (two) times daily.   LORazepam 1 MG tablet Commonly known as: ATIVAN TAKE 1 TABLET(1 MG) BY MOUTH TWICE DAILY AS NEEDED What changed: See the new instructions.   metoprolol succinate 25 MG 24 hr tablet Commonly known as: TOPROL-XL Take 1 tablet (25 mg total) by mouth daily.   pantoprazole 40 MG tablet Commonly known as: PROTONIX Take 1 tablet (40 mg total) by mouth daily. Replaces: esomeprazole 20 MG capsule   sennosides-docusate sodium 8.6-50 MG tablet Commonly known as: SENOKOT-S Take 2 tablets by mouth 2 (two) times daily as needed for constipation.   torsemide 100 MG tablet Commonly known as: DEMADEX Take 100 mg by mouth 2 (two) times daily.   valACYclovir 1000 MG tablet Commonly known as: VALTREX Take 1,000 mg by mouth 2 (two) times daily as needed (cold sores).       Follow-up Information    [facility].   Specialty: Home Health  Services Why: HHPT       [name], MD.   Specialty: Family Medicine Why: 07/09/20 @11AM  Contact information: [address] [place] [address] [phone]        Minna Merritts, MD Follow up.   Specialty: Cardiology Why: office will with date and time of appointment. Please call if you did not heard by Tuesday afternoon  Contact information: [place] [address] [phone]        [name], MD Follow up in 1 week(s).   Specialty: Family Medicine Contact information: [address] [place] [address] [phone]                Time coordinating discharge: 35 min  Signed:  Geradine Girt DO  [facility] 06/30/2020, 11:58 AM

## 2020-06-30 NOTE — Progress Notes (Addendum)
Physical Therapy Treatment Patient Details Name: Amy [name] MRN: [id number] DOB: [birth year] Today's Date: 06/30/2020    History of Present Illness Amy [name] is a 70 y.o. female with medical history significant of ESRD on PD, HTN, multiple thyroid nodules. She had a hip fracture in December s/p ORIF. Recently hospitalized 1/25/-05/29/20 for watery diarrhea, hyponatremia, hypokalemia. She was found to be covid 19 positive but was not treated with no signs of respiratory involvement. had questionable seizure activity and was transfered to [facility] from [facility]. s/p 06/25/20 Heart cath, plan for PCI to LAD on 2/25.    PT Comments    Pt participated in session. Pt was limited by fatigue and overall weakness. Per daughter, pt will be home alone during the day. Due to change in home set up, recommend SNF for additional short term rehab due to severe deconditioning. Pt will benefit from skilled PT to address deficits in balance, strength, coordination, endurance, gait and safety to maximize independence with functional mobility prior to discharge. Pt can tolerate 3 hours of a therapy a day    Follow Up Recommendations  CIR vs. SNF;Supervision/Assistance - 24 hour     Equipment Recommendations  None recommended by PT    Recommendations for Other Services       Precautions / Restrictions Precautions Precautions: Fall Precaution Comments: weakness, peritoneal diaylisis, swollen arms Restrictions Weight Bearing Restrictions: No    Mobility  Bed Mobility Overal bed mobility: Needs Assistance Bed Mobility: Sit to Supine       Sit to supine: Mod assist        Transfers Overall transfer level: Needs assistance Equipment used: Rolling walker (2 wheeled) Transfers: Sit to/from [facility] Sit to Stand: Min assist Stand pivot transfers: Min assist       General transfer comment: min A with increased time  Ambulation/Gait                 Stairs              Wheelchair Mobility    Modified Rankin (Stroke Patients Only)       Contractor                                              Exercises General Exercises - Lower Extremity Long Arc Quad: AROM;Both;Seated;10 reps Heel Slides: Both;10 reps;AROM;Seated Hip Flexion/Marching: 10 reps;AROM;Seated    General Comments General comments (skin integrity, edema, etc.): increased time spent discussing discharge planning; daughter informed therapist pt will home alone during the day. Discussed change of discharge plans to ST-rehab to due to severe deconditioning      Pertinent Vitals/Pain Pain Assessment: No/denies pain    Home Living                      Prior Function            PT Goals (current goals can now be found in the care plan section) Acute Rehab PT Goals Patient Stated Goal: home PT Goal Formulation: With patient/family Time For Goal Achievement: 07/05/20 Potential to Achieve Goals: Good Progress towards  PT goals: Progressing toward goals    Frequency    Min 3X/week      PT Plan Discharge plan needs to be updated    Co-evaluation              AM-PAC PT "6 Clicks" Mobility   Outcome Measure  Help needed turning from your back to your side while in a flat bed without using bedrails?: A Little Help needed moving from lying on your back to sitting on the side of a flat bed without using bedrails?: A Lot Help needed moving to and from a bed to a chair (including a wheelchair)?: A Little Help needed standing up from a chair using your arms (e.g., wheelchair or bedside chair)?: A Little Help needed to walk in hospital room?: A Little Help needed climbing 3-5 steps with a railing? : A Lot 6 Click Score: 16    End of Session Equipment Utilized During Treatment: Gait belt Activity Tolerance: Patient tolerated treatment well;Patient limited by  fatigue Patient left: with call bell/phone within reach;in chair;with family/visitor present Nurse Communication: Mobility status PT Visit Diagnosis: Muscle weakness (generalized) (M62.81);Unsteadiness on feet (R26.81)     Time: 5329-9242 PT Time Calculation (min) (ACUTE ONLY): 31 min  Charges:  $Therapeutic Activity: 23-37 mins                     Lyanne Co, DPT Acute Rehabilitation Services [phone]   [place] 06/30/2020, 11:09 AM

## 2020-06-30 NOTE — Telephone Encounter (Signed)
-----   Message from [place], [place] sent at 06/29/2020  8:37 AM EST ----- Please schedule this patient for a follow-up appointment.  Primary Cardiologist:Dr. Rockey Situ  Date of Discharge: 06/29/2020 Appointment Needed Within: 7-14 days Appointment Type: TOC  Thank you! Robbie Lis, PA-C

## 2020-06-30 NOTE — Discharge Instructions (Signed)
Information about your medication: Plavix (anti-platelet agent)  Generic Name (Brand): clopidogrel (Plavix), once daily medication  PURPOSE: You are taking this medication along with aspirin to lower your chance of having a heart attack, stroke, or blood clots in your heart stent. These can be fatal. Plavix and aspirin help prevent platelets from sticking together and forming a clot that can block an artery or your stent.   Common SIDE EFFECTS you may experience include: bruising or bleeding more easily, shortness of breath  Do not stop taking PLAVIX without talking to the doctor who prescribes it for you. People who are treated with a stent and stop taking Plavix too soon, have a higher risk of getting a blood clot in the stent, having a heart attack, or dying. If you stop Plavix because of bleeding, or for other reasons, your risk of a heart attack or stroke may increase.   Avoid taking NSAID agents or anti-inflammatory medications such as ibuprofen, naproxen given increased bleed risk with plavix - can use acetaminophen (Tylenol) if needed for pain.  Avoid taking over the counter stomach medications omeprazole (Prilosec) or esomeprazole (Nexium) since these do interact and make plavix less effective - ask your pharmacist or doctor for alterative agents if needed for heartburn or GERD.   Tell all of your doctors and dentists that you are taking Plavix. They should talk to the doctor who prescribed Plavix for you before you have any surgery or invasive procedure.   Contact your health care provider if you experience: severe or uncontrollable bleeding, pink/red/brown urine, vomiting blood or vomit that looks like "coffee grounds", red or black stools (looks like tar), coughing up blood or blood clots ----------------------------------------------------------------------------------------------------------------------  

## 2020-06-30 NOTE — NC FL2 (Signed)
[place] LEVEL OF CARE SCREENING TOOL     IDENTIFICATION  Patient Name: Amy [name] Birthdate: 05/05/1950 Sex: female Admission Date (Current Location): 06/19/2020  [facility] and [place] Number:  Herbalist and Address:  [facility] [place]. [facility], [place] [address], [name], [place] [address]      Provider Number: [id number]  Attending Physician Name and Address:  Geradine Girt, DO  Relative Name and Phone Number:  Laquanna, Veazey (Spouse)   [phone] [facility])    Current Level of Care: Hospital Recommended Level of Care: [place] Prior Approval Number:    Date Approved/Denied:   PASRR Number: [id number]  Discharge Plan: SNF    Current Diagnoses: Patient Active Problem List   Diagnosis Date Noted  . Ischemic cardiomyopathy   . Protein-calorie malnutrition, severe 06/23/2020  . Non-ST elevation (NSTEMI) myocardial infarction ([place])   . Pressure injury of skin 06/21/2020  . C. difficile diarrhea 06/19/2020  . C. difficile colitis 06/19/2020  . Seizure ([place]) 06/19/2020  . Gastroenteritis due to COVID-19 virus 05/29/2020  . Surgery, elective   . S/P ORIF (open reduction internal fixation) fracture   . Closed right hip fracture, initial encounter ([place]) 04/22/2020  . Hypotension 12/16/2019  . ESRD on dialysis ([place]) 12/16/2019  . Depression with anxiety 12/16/2019  . Hypomagnesemia 12/16/2019  . Atrial flutter ([place])   . Atrial fibrillation with RVR ([place]) 04/03/2019  . Benign essential HTN 04/03/2019  . HLD (hyperlipidemia) 04/03/2019  . End stage renal disease ([place]) 12/13/2018  . Itching 11/08/2018  . Hyperparathyroidism, secondary renal ([place]) 09/13/2017  . Ground glass opacity present on imaging of lung 04/08/2017  . Osteoarthritis of knee 12/29/2016  . Normocytic anemia 10/14/2016  . Intractable episodic headache   . Gastritis   . History of adenomatous polyp of colon 09/07/2016  . Iron deficiency anemia  09/05/2016  . Hyponatremia 09/05/2016  . Multiple thyroid nodules 07/04/2015  . Right thyroid nodule 06/27/2015  . Allergic rhinitis, seasonal 12/03/2014  . Fever blister 12/03/2014  . Herniated lumbar intervertebral disc 12/03/2014  . Hypokalemia 12/03/2014  . Recurrent sinus infections 12/03/2014  . Insomnia 11/05/2014  . Acquired scoliosis 06/26/2014  . Intervertebral disc stenosis of neural canal of lumbar region 06/20/2014  . Lumbar facet arthropathy 05/21/2014  . Degenerative spondylolisthesis 03/12/2014  . OP (osteoporosis) 08/10/2013  . Idiopathic peripheral neuropathy 03/24/2009  . Edema 12/27/2007  . Headache, tension-type 10/25/2006  . Essential hypertension 10/24/2006  . Anxiety 05/03/1998  . Esophageal reflux 05/03/1998    Orientation RESPIRATION BLADDER Height & Weight     Self,Time,Situation,Place  Normal External catheter Weight:  (on recliner) Height:  5\' 4"  (162.6 cm)  BEHAVIORAL SYMPTOMS/MOOD NEUROLOGICAL BOWEL NUTRITION STATUS      Continent Diet (see d/c summary)  AMBULATORY STATUS COMMUNICATION OF NEEDS Skin   Extensive Assist Verbally PU Stage and Appropriate Care (Pressure injury coccyx)                       Personal Care Assistance Level of Assistance  Bathing,Feeding,Dressing Bathing Assistance: Maximum assistance Feeding assistance: Independent Dressing Assistance: Limited assistance     Functional Limitations Info  Sight,Hearing,Speech Sight Info: Adequate Hearing Info: Adequate      SPECIAL CARE FACTORS FREQUENCY  PT (By licensed PT),OT (By licensed OT)     PT Frequency: 5x/week OT Frequency: 5x/week            Contractures Contractures Info: Not present  Additional Factors Info  Code Status,Allergies Code Status Info: Full code Allergies Info: Cephalosporins, Clarithromycin, Lunesta  (eszopiclone), Penicillin V, Sulfa Antibiotics           Current Medications (06/30/2020):  This is the current hospital active  medication list Current Facility-Administered Medications  Medication Dose Route Frequency Provider Last Rate Last Admin  . 0.9 %  sodium chloride infusion (Manually program via Guardrails IV Fluids)   Intravenous Once Thurnell Lose, MD   Held at 06/22/20 [phone]  . 0.9 %  sodium chloride infusion  250 mL Intravenous PRN Sherren Mocha, MD      . acetaminophen (TYLENOL) tablet 650 mg  650 mg Oral Q4H PRN Eulogio Bear U, DO   650 mg at 06/27/20 2050  . acetaminophen (TYLENOL) tablet 650 mg  650 mg Oral Once Chotiner, Yevonne Aline, MD      . aspirin chewable tablet 81 mg  81 mg Oral Daily Troy Sine, MD   81 mg at 06/30/20 1108  . atorvastatin (LIPITOR) tablet 80 mg  80 mg Oral q1800 Troy Sine, MD   80 mg at 06/29/20 1700  . butalbital-acetaminophen-caffeine (FIORICET) 50-325-40 MG per tablet 1-2 tablet  1-2 tablet Oral Q6H PRN Eulogio Bear U, DO   2 tablet at 06/29/20 2040  . calcitRIOL (ROCALTROL) capsule 0.5 mcg  0.5 mcg Oral Daily Reesa Chew, MD   0.5 mcg at 06/30/20 1108  . Chlorhexidine Gluconate Cloth 2 % PADS 6 each  6 each Topical Daily Norins, Heinz Knuckles, MD   6 each at 06/29/20 1123  . cinacalcet (SENSIPAR) tablet 30 mg  30 mg Oral Q breakfast Reesa Chew, MD   30 mg at 06/29/20 1102  . clopidogrel (PLAVIX) tablet 75 mg  75 mg Oral Daily Lyda Jester M, PA-C   75 mg at 06/30/20 1108  . Darbepoetin Alfa (ARANESP) injection 100 mcg  100 mcg Subcutaneous Q Sat-1800 Claudia Desanctis, MD   100 mcg at 06/28/20 1712  . diazepam (VALIUM) tablet 5 mg  5 mg Oral Q6H PRN Troy Sine, MD   5 mg at 06/29/20 2040  . feeding supplement (ENSURE ENLIVE / ENSURE PLUS) liquid 237 mL  237 mL Oral TID BM Vann, Jessica U, DO   237 mL at 06/29/20 1123  . furosemide (LASIX) injection 80 mg  80 mg Intravenous Daily Claudia Desanctis, MD   80 mg at 06/30/20 1108  . gentamicin cream (GARAMYCIN) 0.1 % 1 application  1 application Topical Daily Reesa Chew, MD   1 application at  59/74/16 1123  . Gerhardt's butt cream   Topical PRN Thurnell Lose, MD   Given at 06/22/20 1616  . heparin 2,500 Units in dialysis solution 2.5% low-MG/low-CA 5,000 mL dialysis solution   Peritoneal Dialysis PRN Reesa Chew, MD      . levETIRAcetam (KEPPRA) IVPB 500 mg/100 mL premix  500 mg Intravenous Q12H Perfecto Kingdom, MD 400 mL/hr at 06/30/20 0518 500 mg at 06/30/20 0518  . metoprolol succinate (TOPROL-XL) 24 hr tablet 25 mg  25 mg Oral Daily Larey Dresser, MD   25 mg at 06/30/20 1108  . multivitamin (RENA-VIT) tablet 1 tablet  1 tablet Oral QHS Thurnell Lose, MD   1 tablet at 06/29/20 2040  . ondansetron (ZOFRAN) injection 4 mg  4 mg Intravenous Q6H PRN Troy Sine, MD   4 mg at 06/25/20 2341  . pantoprazole (PROTONIX) EC tablet 40  mg  40 mg Oral Daily Eulogio Bear U, DO   40 mg at 06/30/20 1108  . potassium chloride (KLOR-CON) CR tablet 10 mEq  10 mEq Oral Daily Claudia Desanctis, MD   10 mEq at 06/30/20 1108  . sodium chloride flush (NS) 0.9 % injection 3 mL  3 mL Intravenous Once Carmin Muskrat, MD      . sodium chloride flush (NS) 0.9 % injection 3 mL  3 mL Intravenous Q12H Kathyrn Drown D, NP   3 mL at 06/30/20 1123  . sodium chloride flush (NS) 0.9 % injection 3 mL  3 mL Intravenous Q12H Sherren Mocha, MD   3 mL at 06/30/20 1122  . sodium chloride flush (NS) 0.9 % injection 3 mL  3 mL Intravenous PRN Sherren Mocha, MD      . traZODone (DESYREL) tablet 25 mg  25 mg Oral QHS PRN Norins, Heinz Knuckles, MD   25 mg at 06/29/20 2040     Discharge Medications: Please see discharge summary for a list of discharge medications.  Relevant Imaging Results:  Relevant Lab Results:   Additional Information SSN [id number] Pt gets perotineal dialysis and would be switched to HD  to be clipped and go tto SNF  [employer], LCSW

## 2020-06-30 NOTE — Progress Notes (Signed)
Occupational Therapy Treatment Patient Details Name: Amy [name] MRN: [id number] DOB: 12-23-1950 Today's Date: 06/30/2020    History of present illness Amy [name] is a 70 y.o. female with medical history significant of ESRD on PD, HTN, multiple thyroid nodules. She had a hip fracture in December s/p ORIF. Recently hospitalized 1/25/-05/29/20 for watery diarrhea, hyponatremia, hypokalemia. She was found to be covid 19 positive but was not treated with no signs of respiratory involvement. had questionable seizure activity and was transfered to [facility]. s/p 06/25/20 Heart cath, plan for PCI to LAD on 2/25.   OT comments  Pt is progressing towards OT goals this session, eager to work with therapy and continues to need assist for transfers, ADL, and presents with generalized weakness, decreased activity tolerance due to prolongued hospitalization and intervention. Pt will require CIR post-acute to maximize safety and independence in ADL and functional transfers and return her to PLOF (modified independent). POC updated.  Follow Up Recommendations  CIR    Equipment Recommendations  None recommended by OT    Recommendations for Other Services Rehab consult    Precautions / Restrictions Precautions Precautions: Fall Precaution Comments: weakness, peritoneal diaylisis Restrictions Weight Bearing Restrictions: No       Mobility Bed Mobility Overal bed mobility: Needs Assistance Bed Mobility: Sit to Supine       Sit to supine: Mod assist   General bed mobility comments: assist for BLE    Transfers Overall transfer level: Needs assistance Equipment used: Rolling walker (2 wheeled) Transfers: Sit to/from [facility] Sit to Stand: Mod assist Stand pivot transfers: Min assist       General transfer comment: mod A from recliner, min A to pivot    Balance Overall balance assessment: Needs assistance Sitting-balance support: Feet supported;No upper  extremity supported Sitting balance-Leahy Scale: Fair     Standing balance support: Bilateral upper extremity supported Standing balance-Leahy Scale: Poor Standing balance comment: reliant on external assist                           ADL either performed or assessed with clinical judgement   ADL Overall ADL's : Needs assistance/impaired     Grooming: Wash/dry hands;Wash/dry face;Set up;Sitting Grooming Details (indicate cue type and reason): [facility]                 Toilet Transfer: Moderate assistance;Stand-pivot;BSC Toilet Transfer Details (indicate cue type and reason): one person face to face transfer [place] and Hygiene: Maximal assistance;Sit to/from stand Toileting - Clothing Manipulation Details (indicate cue type and reason): Pt able to stand while OT performed gentle peri care     Functional mobility during ADLs: Min guard;Rolling walker General ADL Comments: decreased activity tolerance/balance     Vision       Perception     Praxis      Cognition Arousal/Alertness: Awake/alert Behavior During Therapy: WFL for tasks assessed/performed Overall Cognitive Status: Within Functional Limits for tasks assessed                                          Exercises     Shoulder Instructions       General Comments Daughter present throughout the session, in agreement with post-acute rehab    Pertinent Vitals/ Pain       Pain Assessment: No/denies pain  Pain Intervention(s): Monitored during session  Home Living                                          Prior Functioning/Environment              Frequency  Min 2X/week        Progress Toward Goals  OT Goals(current goals can now be found in the care plan section)  Progress towards OT goals: Progressing toward goals  Acute Rehab OT Goals Patient Stated Goal: get stronger and back to independent OT Goal Formulation: With  patient/family Time For Goal Achievement: 07/05/20 Potential to Achieve Goals: Good  Plan Discharge plan needs to be updated;Frequency remains appropriate    Co-evaluation                 AM-PAC OT "6 Clicks" Daily Activity     Outcome Measure   Help from another person eating meals?: A Little Help from another person taking care of personal grooming?: A Little Help from another person toileting, which includes using toliet, bedpan, or urinal?: A Lot Help from another person bathing (including washing, rinsing, drying)?: A Lot Help from another person to put on and taking off regular upper body clothing?: A Lot Help from another person to put on and taking off regular lower body clothing?: A Lot 6 Click Score: 14    End of Session Equipment Utilized During Treatment: Gait belt;Rolling walker  OT Visit Diagnosis: Unsteadiness on feet (R26.81);Other abnormalities of gait and mobility (R26.89);Muscle weakness (generalized) (M62.81)   Activity Tolerance Patient tolerated treatment well   Patient Left in bed;with call bell/phone within reach;Other (comment) (daughter in room)   Nurse Communication Mobility status (bowel movement and urine)        Time: 7915-0569 OT Time Calculation (min): 32 min  Charges: OT General Charges $OT Visit: 1 Visit OT Treatments $Self Care/Home Management : 8-22 mins $Therapeutic Activity: 8-22 mins  Jesse Sans OTR/L Acute Rehabilitation Services Pager: [phone] Office: [place] 06/30/2020, 3:17 PM

## 2020-06-30 NOTE — TOC Progression Note (Addendum)
Transition of Care [facility]) - Progression Note    Patient Details  Name: Amy [name] MRN: [id number] Date of Birth: Jan 10, 1951  Transition of Care [facility]) CM/SW [place], [place] Phone Number: 06/30/2020, 2:31 PM  Clinical Narrative:     CSW met with pt and pt wife bedside after being notified that pt is now recommended SNF. Daughter and pt confirm this plan and have a preference for [facility] in [place]. Pt does receive peritoneal currently and is vaccinated x2.   60: CSW spoke with renal navigater and confirmed that SNF's do not accept pt's on PD. Pt would need to be switched to HD temporarily to go to SNF for rehab. Another option is for pt to maintain PD and go to CIR.   1030: CSW spoke with PT/OT who confirmed pt could be appropriate for CIR. CIR consulted; CSW made direct contact requesting pt be reviewed for CIR.   1300: Csw met with pt and pt daughter bedside and updated them about PD being a barrier to SNF.  CSW explained that if CIR wasn't approve, pt would need to consider HD to go to a SNF or return home with [facility]. Pt and daughter are hopeful for CIR. fl2 completed and be request sent in hub.   Expected Discharge Plan: [place] Barriers to Discharge: Continued Medical Work up  Expected Discharge Plan and Services Expected Discharge Plan: [place]   Discharge Planning Services: CM Consult   Living arrangements for the past 2 months: Single Family Home Expected Discharge Date: 06/30/20                 DME Agency: NA       HH Arranged: PT HH Agency: [place] ([facility]) Date HH Agency Contacted: 06/24/20 Time [place]: 1237 Representative spoke with at [place]: [place] ([place]) Interventions    Readmission Risk Interventions Readmission Risk Prevention Plan 06/24/2020  Transportation Screening Complete  PCP or Specialist Appt within 3-5 Days Complete  HRI or  [place] Complete  Social Work Consult for [place] Planning/Counseling Complete  Palliative Care Screening Not Applicable  Medication Review Press photographer) Complete  Some recent data might be hidden

## 2020-07-01 ENCOUNTER — Other Ambulatory Visit: Payer: Self-pay

## 2020-07-01 ENCOUNTER — Encounter (HOSPITAL_COMMUNITY): Payer: Self-pay | Admitting: Internal Medicine

## 2020-07-01 DIAGNOSIS — N186 End stage renal disease: Secondary | ICD-10-CM | POA: Diagnosis not present

## 2020-07-01 DIAGNOSIS — Z992 Dependence on renal dialysis: Secondary | ICD-10-CM | POA: Diagnosis not present

## 2020-07-01 DIAGNOSIS — I214 Non-ST elevation (NSTEMI) myocardial infarction: Secondary | ICD-10-CM | POA: Diagnosis not present

## 2020-07-01 LAB — BPAM RBC
Blood Product Expiration Date: 202203312359
ISSUE DATE / TIME: 202202270527
Unit Type and Rh: 5100

## 2020-07-01 LAB — TYPE AND SCREEN
ABO/RH(D): O POS
Antibody Screen: NEGATIVE
Unit division: 0

## 2020-07-01 NOTE — Progress Notes (Signed)
Progress Note    Amy [name]  GYB:[id number] DOB: 12-19-1950  DOA: 06/19/2020 PCP: [name], MD    Brief Narrative:     Medical records reviewed and are as summarized below:  Amy [name] is an 70 y.o. female with medical history significant ofESRD on PD, HTN, multiple thyroid nodules. She had a hip fracture in December s/p ORIF. Recently hospitalized 1/25/-05/29/20 for watery diarrhea, hyponatremia, hypokalemia. She was found to be covid 19 positive but was not treated with no signs of respiratory involvement. She did test positive for C. Diff by PCR and was started on oral Vancomycin with a 9 day Rx at discharge which she was unable to get filled. She also developed sinus tachycardia with NSTEMI for which she was seen by cardiology. She is s/p heart cath and evaluated by CVTS for CABG. Deemed not a candidate so went cath lab 2/25 for stent.  Unable to go to SNF due to PD.  Await CIR eval.  Assessment/Plan:   Active Problems:   Anxiety   Fever blister   Essential hypertension   Hypokalemia   Hyponatremia   HLD (hyperlipidemia)   ESRD on dialysis (HCC)   C. difficile diarrhea   C. difficile colitis   Seizure (HCC)   Pressure injury of skin   Protein-calorie malnutrition, severe   Non-ST elevation (NSTEMI) myocardial infarction ([place])   Ischemic cardiomyopathy   Seizure with postictal state : due to severe electrolyte abnormalities including hyponatremia, hypomagnesemia, hypokalemia caused by C. difficile diarrhea  - MRI negative -EEG shows postictal state, seen by neurology  - Potassium and magnesium now stable after aggressive replacement, has been hydrated with IV fluids - on Keppra  -Neuro recommends DC on Keppra with Neuro follow up -d/c wellbutrin for now-- outpatient follow up   C. difficile infection present on admission.  Being treated with Dificid as she seems to have failed 10-day course of vancomycin (not sure she took?).   -finished course in  hospital as abx to 2/27   Recent COVID-19 incidental infection.  Supportive care.   ESRD (52yrs) .  On PD.  Renal outpatient follow  Anemia of chronic disease.  Anemia panel suggestive of chronic disease, no signs of active bleeding, 1 unit of packed RBC transfusion on 06/22/2020 and again 2/27  sinus tachycardia/ NSTEMI.  - CTA chest and leg ultrasound unremarkable - echocardiogram shows a depressed EF of 25% with typical wall motion abnormalities -s/p cath: Severe multivessel CAD with diffuse 80%, 95% and 80% proximal LAD stenosis, 90% diagonal stenosis;  85% focal ramus intermediate stenosis;  and 50% stenoses in the AV groove circumflex. -CVTS: not a candidate for CABG, unable to get cardiac MRI -cath lab on 2/25 for intervention: stent placed-- ASA/plavix for min of 1 year   Chronic systolic CHF with EF 25 to 30%.   -Fluid removal through PD.   Pressure Injury 06/20/20 Coccyx Medial Stage 1 -  Intact skin with non-blanchable redness of a localized area usually over a bony prominence. (Active)  06/20/20 1500  Location: Coccyx  Location Orientation: Medial  Staging: Stage 1 -  Intact skin with non-blanchable redness of a localized area usually over a bony prominence.  Wound Description (Comments):   Present on Admission: Yes       Family Communication/Anticipated D/C date and plan/Code Status   DVT prophylaxis: scd Code Status: Full Code.  Disposition Plan: Status is: Inpatient  Remains inpatient appropriate because:Inpatient level of care appropriate due to  severity of illness   Dispo: The patient is from: Home              Anticipated d/c is to: CIR??              Anticipated d/c date is:1 day              Patient currently is not medically stable to d/c.   Difficult to place patient No         Medical Consultants:    Cards  CVTS  Renal   Subjective:   Encouraged patient to get up and move as she is resistant due to weakness  Objective:     Vitals:   06/30/20 2340 07/01/20 0300 07/01/20 0700 07/01/20 0809  BP: 115/71 118/72 116/69 127/73  Pulse: 91 89 93 97  Resp: 15 17 11 13   Temp: 97.6 F (36.4 C) 97.9 F (36.6 C) 98 F (36.7 C) 98.1 F (36.7 C)  TempSrc: Oral Oral Oral Oral  SpO2: 95% 97% 98% 100%  Weight:  54.7 kg    Height:        Intake/Output Summary (Last 24 hours) at 07/01/2020 1112 Last data filed at 07/01/2020 0933 Gross per 24 hour  Intake 153 ml  Output 150 ml  Net 3 ml   Filed Weights   06/30/20 0407 06/30/20 1738 07/01/20 0300  Weight: 55.3 kg 53.8 kg 54.7 kg    Exam:  In bed, NAD No increased work of breathing    Data Reviewed:   I have personally reviewed following labs and imaging studies:  Labs: Labs show the following:   Basic Metabolic Panel: Recent Labs  Lab 06/25/20 0104 06/25/20 1123 06/26/20 0107 06/27/20 0026 06/28/20 0121 06/29/20 0111 06/30/20 0046  NA 131*  --  129* 130* 130* 131* 131*  K 3.5  --  4.1 3.5 4.0 3.7 4.0  CL 98  --  97* 96* 98 97* 99  CO2 23  --  21* 23 22 23  21*  GLUCOSE 110*  --  125* 104* 108* 100* 114*  BUN 27*  --  30* 29* 32* 29* 30*  CREATININE 4.02*   < > 4.02* 4.19* 4.63* 4.65* 4.75*  CALCIUM 7.9*  --  8.1* 8.4* 8.2* 8.3* 8.2*  MG 1.5*  --  1.8 1.8  --   --  1.6*  PHOS  --   --   --  3.1  --  3.9 3.4   < > = values in this interval not displayed.   GFR Estimated Creatinine Clearance: 9.4 mL/min (A) (by C-G formula based on SCr of 4.75 mg/dL (H)). Liver Function Tests: Recent Labs  Lab 06/25/20 0104 06/26/20 0107 06/27/20 0026 06/29/20 0111 06/30/20 0046  AST 22 20 19   --   --   ALT 20 18 21   --   --   ALKPHOS 80 91 77  --   --   BILITOT 0.5 0.2* 0.3  --   --   PROT 3.4* 3.6* 3.6*  --   --   ALBUMIN 1.4* 1.6* 1.6* 1.4* 1.4*   No results for input(s): LIPASE, AMYLASE in the last 168 hours. Recent Labs  Lab 06/29/20 1042  AMMONIA 74*   Coagulation profile No results for input(s): INR, PROTIME in the last 168  hours.  CBC: Recent Labs  Lab 06/25/20 0104 06/25/20 1123 06/26/20 0107 06/27/20 0026 06/27/20 1949 06/28/20 0121 06/29/20 0111 06/29/20 1042 06/30/20 0046  WBC 7.8   < >  10.5 8.7 9.8 8.8 7.3  --  10.4  NEUTROABS 5.6  --  8.4* 6.3  --   --   --   --   --   HGB 8.7*   < > 8.8* 8.1* 7.5* 7.5* 6.7* 11.0* 9.8*  HCT 24.0*   < > 25.6* 24.0* 22.8* 22.3* 19.1* 33.1* 29.4*  MCV 86.3   < > 88.9 88.9 91.2 90.3 89.7  --  89.9  PLT 216   < > 271 246 236 238 238  --  227   < > = values in this interval not displayed.   Cardiac Enzymes: No results for input(s): CKTOTAL, CKMB, CKMBINDEX, TROPONINI in the last 168 hours. BNP (last 3 results) No results for input(s): PROBNP in the last 8760 hours. CBG: No results for input(s): GLUCAP in the last 168 hours. D-Dimer: No results for input(s): DDIMER in the last 72 hours. Hgb A1c: No results for input(s): HGBA1C in the last 72 hours. Lipid Profile: No results for input(s): CHOL, HDL, LDLCALC, TRIG, CHOLHDL, LDLDIRECT in the last 72 hours. Thyroid function studies: No results for input(s): TSH, T4TOTAL, T3FREE, THYROIDAB in the last 72 hours.  Invalid input(s): FREET3 Anemia work up: No results for input(s): VITAMINB12, FOLATE, FERRITIN, TIBC, IRON, RETICCTPCT in the last 72 hours. Sepsis Labs: Recent Labs  Lab 06/27/20 1949 06/28/20 0121 06/29/20 0111 06/30/20 0046  WBC 9.8 8.8 7.3 10.4    Microbiology No results found for this or any previous visit (from the past 240 hour(s)).  Procedures and diagnostic studies:  No results found.  Medications:   . sodium chloride   Intravenous Once  . acetaminophen  650 mg Oral Once  . aspirin  81 mg Oral Daily  . atorvastatin  80 mg Oral q1800  . calcitRIOL  0.5 mcg Oral Daily  . Chlorhexidine Gluconate Cloth  6 each Topical Daily  . cinacalcet  30 mg Oral Q breakfast  . clopidogrel  75 mg Oral Daily  . darbepoetin (ARANESP) injection - NON-DIALYSIS  100 mcg Subcutaneous Q Sat-1800  .  feeding supplement  237 mL Oral TID BM  . gentamicin cream  1 application Topical Daily  . levETIRAcetam  500 mg Oral BID  . metoprolol succinate  25 mg Oral Daily  . multivitamin  1 tablet Oral QHS  . pantoprazole  40 mg Oral Daily  . potassium chloride  10 mEq Oral Daily  . sodium chloride flush  3 mL Intravenous Once  . sodium chloride flush  3 mL Intravenous Q12H  . sodium chloride flush  3 mL Intravenous Q12H  . torsemide  100 mg Oral BID   Continuous Infusions: . sodium chloride       LOS: 12 days   Geradine Girt  [facility]   How to contact the [facility] Attending or Consulting provider [place] or covering provider during after hours [place], for this patient?  1. Check the care team in [facility] and look for a) attending/consulting [facility] provider listed and b) the [facility] team listed 2. Log into [email or web address] and use [facility]'s universal password to access. If you do not have the password, please contact the hospital operator. 3. Locate the [facility] provider you are looking for under [facility] and page to a number that you can be directly reached. 4. If you still have difficulty reaching the provider, please page the [facility] (Director on Call) for the Hospitalists listed on amion for assistance.  07/01/2020, 11:12 AM

## 2020-07-01 NOTE — Progress Notes (Signed)
Late entry  Inpatient Rehab Admissions Coordinator:   Per therapy updated recommendations, pt was screened for CIR candidacy by Shann Medal, PT, DPT.  At this time we are recommending CIR consult and I will place an order per our protocol.  Please contact me with questions.   Shann Medal, PT, DPT [phone] 07/01/20 9:45 AM

## 2020-07-01 NOTE — Progress Notes (Addendum)
Patient ID: Amy [name], female   DOB: January 08, 1951, 70 y.o.   MRN: [id number]     Advanced Heart Failure Rounding Note  PCP-Cardiologist: Ida Rogue, MD   Subjective:    2/25 S/P PCI LAD . RUE hematoma.  2/26 Doppler no aneurysm, AV fistula, or compartment syndrome.  2/27 Received 1UPRBC  Having ongoing leg fatigue.   Objective:   Weight Range: 54.7 kg Body mass index is 20.7 kg/m.   Vital Signs:   Temp:  [97.6 F (36.4 C)-98.4 F (36.9 C)] 98.1 F (36.7 C) (03/01 0809) Pulse Rate:  [89-121] 97 (03/01 0809) Resp:  [9-18] 13 (03/01 0809) BP: (114-129)/(69-79) 127/73 (03/01 0809) SpO2:  [95 %-100 %] 100 % (03/01 0809) Weight:  [53.8 kg-54.7 kg] 54.7 kg (03/01 0300) Last BM Date: 06/29/20  Weight change: Filed Weights   06/30/20 0407 06/30/20 1738 07/01/20 0300  Weight: 55.3 kg 53.8 kg 54.7 kg    Intake/Output:   Intake/Output Summary (Last 24 hours) at 07/01/2020 1000 Last data filed at 07/01/2020 0933 Gross per 24 hour  Intake 153 ml  Output 150 ml  Net 3 ml      Physical Exam   General:  No resp difficulty HEENT: normal Neck: supple. no JVD. Carotids 2+ bilat; no bruits. No lymphadenopathy or thryomegaly appreciated. Cor: PMI nondisplaced. Regular rate & rhythm. No rubs, gallops or murmurs. Lungs: clear on room air.  Abdomen: soft, nontender, nondistended. No hepatosplenomegaly. No bruits or masses. Good bowel sounds. Extremities: no cyanosis, clubbing, rash, edema. RUE ecchymotic Neuro: alert & orientedx3, cranial nerves grossly intact. moves all 4 extremities w/o difficulty. Affect pleasant   Telemetry  NSR 80-90s  Labs    CBC Recent Labs    06/29/20 0111 06/29/20 1042 06/30/20 0046  WBC 7.3  --  10.4  HGB 6.7* 11.0* 9.8*  HCT 19.1* 33.1* 29.4*  MCV 89.7  --  89.9  PLT 238  --  295   Basic Metabolic Panel Recent Labs    06/29/20 0111 06/30/20 0046  NA 131* 131*  K 3.7 4.0  CL 97* 99  CO2 23 21*  GLUCOSE 100* 114*  BUN 29* 30*   CREATININE 4.65* 4.75*  CALCIUM 8.3* 8.2*  MG  --  1.6*  PHOS 3.9 3.4   Liver Function Tests Recent Labs    06/29/20 0111 06/30/20 0046  ALBUMIN 1.4* 1.4*   No results for input(s): LIPASE, AMYLASE in the last 72 hours. Cardiac Enzymes No results for input(s): CKTOTAL, CKMB, CKMBINDEX, TROPONINI in the last 72 hours.  BNP: BNP (last 3 results) Recent Labs    06/25/20 0104 06/26/20 0107 06/27/20 0026  BNP 1,416.4* 1,112.0* 1,371.7*    ProBNP (last 3 results) No results for input(s): PROBNP in the last 8760 hours.   D-Dimer No results for input(s): DDIMER in the last 72 hours. Hemoglobin A1C No results for input(s): HGBA1C in the last 72 hours. Fasting Lipid Panel No results for input(s): CHOL, HDL, LDLCALC, TRIG, CHOLHDL, LDLDIRECT in the last 72 hours. Thyroid Function Tests No results for input(s): TSH, T4TOTAL, T3FREE, THYROIDAB in the last 72 hours.  Invalid input(s): FREET3  Other results:   Imaging    No results found.   Medications:     Scheduled Medications: . sodium chloride   Intravenous Once  . acetaminophen  650 mg Oral Once  . aspirin  81 mg Oral Daily  . atorvastatin  80 mg Oral q1800  . calcitRIOL  0.5 mcg Oral Daily  .  Chlorhexidine Gluconate Cloth  6 each Topical Daily  . cinacalcet  30 mg Oral Q breakfast  . clopidogrel  75 mg Oral Daily  . darbepoetin (ARANESP) injection - NON-DIALYSIS  100 mcg Subcutaneous Q Sat-1800  . feeding supplement  237 mL Oral TID BM  . gentamicin cream  1 application Topical Daily  . levETIRAcetam  500 mg Oral BID  . metoprolol succinate  25 mg Oral Daily  . multivitamin  1 tablet Oral QHS  . pantoprazole  40 mg Oral Daily  . potassium chloride  10 mEq Oral Daily  . sodium chloride flush  3 mL Intravenous Once  . sodium chloride flush  3 mL Intravenous Q12H  . sodium chloride flush  3 mL Intravenous Q12H  . torsemide  100 mg Oral BID    Infusions: . sodium chloride      PRN  Medications: sodium chloride, acetaminophen, butalbital-acetaminophen-caffeine, diazepam, Gerhardt's butt cream, dianeal solution for CAPD/[name] with heparin, ondansetron (ZOFRAN) IV, sodium chloride flush, traZODone   Assessment/Plan   1. Seizure: Cause of initial presentation.  Thought to be due to electrolyte abnormalities due to C difficile diarrhea. EEG showed post-ictal state, was seen by neurology.  - She is on Keppra per primary service.  - No seizure over the weekend.  2. C difficile diarrhea: Completed fidaxomicin course, failed course of oral vancomycin.  3. Recent COVID-19 infection.  4. ESRD:  Per Nephrology. On PD.   5. CAD: NSTEMI with peak HS-TnI 1978.  Cath with serial proximal-mid LAD severe stenoses, 85% proximal ramus stenosis.  She was evaluated by TCTS and discussed with Dr. Orvan Seen.  I think that with her current comorbidities and deconditioning, she would be a poor CABG candidate. Unable to do MRI viability study given ESRD on PD.  - S/P  PCI to LAD. RUE hematoma. No chest pain.  - Continue ASA 81 and Plavix  - Continue high dose statin.  6. Acute on chronic systolic CHF: Echo with EF 25-30%, LAD territory wall motion abnormalities.  - Volume managed by nephrology with PD and on torsemide 100 mg twice a day.    - Continue beta blocker, Toprol Xl 25 mg daily.  - SBP stable, will not add additional HF medications at this time.  7. Anemia -RUE hematoma -2/27 hgb 6.7 received blood with appropriate rise -->9.8. No labs today.  8. Deconditioning.  Recommending CIR.    HF/Cardiology Team will sign off. Please call if needed.   Length of Stay: [place]li[place], NP  07/01/2020, 10:00 AM  Advanced Heart Failure Team Pager [phone] (M-F; 7a - 4p)  Please contact [facility] for night-coverage after hours (4p -7a ) and weekends on [email or web address]  Agree with the above note.  She is stable from a cardiac perspective.  Awaiting disposition, ?to CIR.  We will sign off for now,  will arrange followup.   Loralie Champagne 07/01/2020

## 2020-07-01 NOTE — Progress Notes (Addendum)
Subjective: Seen in room with daughter at chair side, up in bedside chair no current complaints said tolerating nightly PD.  Objective Vital signs in last 24 hours: Vitals:   07/01/20 0700 07/01/20 0809 07/01/20 1123 07/01/20 1205  BP: 116/69 127/73 129/76   Pulse: 93 97  (!) 102  Resp: 11 13 12 17   Temp: 98 F (36.7 C) 98.1 F (36.7 C) 97.9 F (36.6 C)   TempSrc: Oral Oral Oral   SpO2: 98% 100% 98% 98%  Weight:      Height:       Weight change: -1.5 kg  Physical Exam: General: Calm, Alert elderly female, NAD Heart: RRR, no MRG Lungs: CTA, room air nonlabored breathing Abdomen: Positive BS, soft , ND, NT, PD catheter dressing dry Extremities: Bilateral 1+ ankle edema Dialysis Access: PD catheter dressing dry and clear   OP  Dialysis = Home PD [facility] followed by Dr. Candiss Norse  Problem/Plan: 1. Multivessel CAD with reduced EF 25/30% =Card Following, noted Eval by TCTS  "poor CABG candidate"  status post PCI to LAD with DES/on Plavix and ASA 81 mg, high-dose statin 2. ESRD -stable on PD, also on torsemide 100 mg daily with some results per patient minimal documentation I/Os.  Noted on low-dose 10% potassium supplement K4.0, follow-up trend 3. HTN/volume -BP lowish, on metoprolol 25 mg daily and okay to start losartan 25 mg as outpatient per cardiology recommendation follow-up trend with PD to avoid hypotension 4. Anemia -Hgb dropped to 6.7 status post 1 unit PRBCs, Hgb 9.8 2/28 status post Aranesp 100 MCG q. Saturday,/noted iron sat 91% ferritin 1131 no iron needed  5. Secondary hyperparathyroidism no binder listed, on Sensipar 30, stopped Rocaltrol with elevated calcium>10 now 8.2, corrected 9.8 6. Nutrition= need renal diet Albumin low at 1.4, noted on Ensure 3 times daily, and ESRD patient follow-up k  but currently stable even with p.o. k supplement 7. Disposition= awaiting CIR admit for rehab  Ernest Haber, PA-C [place]  [phone] 07/01/2020,2:07 PM  LOS: 12 days   Labs: Basic Metabolic Panel: Recent Labs  Lab 06/27/20 0026 06/28/20 0121 06/29/20 0111 06/30/20 0046  NA 130* 130* 131* 131*  K 3.5 4.0 3.7 4.0  CL 96* 98 97* 99  CO2 23 22 23  21*  GLUCOSE 104* 108* 100* 114*  BUN 29* 32* 29* 30*  CREATININE 4.19* 4.63* 4.65* 4.75*  CALCIUM 8.4* 8.2* 8.3* 8.2*  PHOS 3.1  --  3.9 3.4   Liver Function Tests: Recent Labs  Lab 06/25/20 0104 06/26/20 0107 06/27/20 0026 06/29/20 0111 06/30/20 0046  AST 22 20 19   --   --   ALT 20 18 21   --   --   ALKPHOS 80 91 77  --   --   BILITOT 0.5 0.2* 0.3  --   --   PROT 3.4* 3.6* 3.6*  --   --   ALBUMIN 1.4* 1.6* 1.6* 1.4* 1.4*   No results for input(s): LIPASE, AMYLASE in the last 168 hours. Recent Labs  Lab 06/29/20 1042  AMMONIA 74*   CBC: Recent Labs  Lab 06/25/20 0104 06/25/20 1123 06/26/20 0107 06/27/20 0026 06/27/20 1949 06/28/20 0121 06/29/20 0111 06/29/20 1042 06/30/20 0046  WBC 7.8   < > 10.5 8.7 9.8 8.8 7.3  --  10.4  NEUTROABS 5.6  --  8.4* 6.3  --   --   --   --   --   HGB 8.7*   < >  8.8* 8.1* 7.5* 7.5* 6.7* 11.0* 9.8*  HCT 24.0*   < > 25.6* 24.0* 22.8* 22.3* 19.1* 33.1* 29.4*  MCV 86.3   < > 88.9 88.9 91.2 90.3 89.7  --  89.9  PLT 216   < > 271 246 236 238 238  --  227   < > = values in this interval not displayed.   Cardiac Enzymes: No results for input(s): CKTOTAL, CKMB, CKMBINDEX, TROPONINI in the last 168 hours. CBG: No results for input(s): GLUCAP in the last 168 hours.  Studies/Results: No results found. Medications: . sodium chloride     . sodium chloride   Intravenous Once  . acetaminophen  650 mg Oral Once  . aspirin  81 mg Oral Daily  . atorvastatin  80 mg Oral q1800  . calcitRIOL  0.5 mcg Oral Daily  . Chlorhexidine Gluconate Cloth  6 each Topical Daily  . cinacalcet  30 mg Oral Q breakfast  . clopidogrel  75 mg Oral Daily  . darbepoetin (ARANESP) injection - NON-DIALYSIS  100 mcg Subcutaneous Q  Sat-1800  . feeding supplement  237 mL Oral TID BM  . gentamicin cream  1 application Topical Daily  . levETIRAcetam  500 mg Oral BID  . metoprolol succinate  25 mg Oral Daily  . multivitamin  1 tablet Oral QHS  . pantoprazole  40 mg Oral Daily  . potassium chloride  10 mEq Oral Daily  . sodium chloride flush  3 mL Intravenous Once  . sodium chloride flush  3 mL Intravenous Q12H  . sodium chloride flush  3 mL Intravenous Q12H  . torsemide  100 mg Oral BID     I have seen and examined this patient and agree with plan and assessment in the above note with renal recommendations/intervention highlighted.  Feeling well but still weak.  Plan for CIR when bed available.  Governor Rooks Daquarius Dubeau,MD 07/01/2020 2:57 PM

## 2020-07-02 ENCOUNTER — Inpatient Hospital Stay: Payer: Medicare Other | Admitting: Family Medicine

## 2020-07-02 DIAGNOSIS — R569 Unspecified convulsions: Secondary | ICD-10-CM | POA: Diagnosis not present

## 2020-07-02 DIAGNOSIS — A0472 Enterocolitis due to Clostridium difficile, not specified as recurrent: Secondary | ICD-10-CM | POA: Diagnosis not present

## 2020-07-02 DIAGNOSIS — Z992 Dependence on renal dialysis: Secondary | ICD-10-CM | POA: Diagnosis not present

## 2020-07-02 DIAGNOSIS — N186 End stage renal disease: Secondary | ICD-10-CM | POA: Diagnosis not present

## 2020-07-02 LAB — BASIC METABOLIC PANEL
Anion gap: 15 (ref 5–15)
BUN: 28 mg/dL — ABNORMAL HIGH (ref 8–23)
CO2: 18 mmol/L — ABNORMAL LOW (ref 22–32)
Calcium: 8.7 mg/dL — ABNORMAL LOW (ref 8.9–10.3)
Chloride: 99 mmol/L (ref 98–111)
Creatinine, Ser: 4.68 mg/dL — ABNORMAL HIGH (ref 0.44–1.00)
GFR, Estimated: 9 mL/min — ABNORMAL LOW (ref >60)
Glucose, Bld: 90 mg/dL (ref 70–99)
Potassium: 3.4 mmol/L — ABNORMAL LOW (ref 3.5–5.1)
Sodium: 132 mmol/L — ABNORMAL LOW (ref 135–145)

## 2020-07-02 LAB — CBC
HCT: 27.3 % — ABNORMAL LOW (ref 36.0–46.0)
Hemoglobin: 9.3 g/dL — ABNORMAL LOW (ref 12.0–15.0)
MCH: 31.1 pg (ref 26.0–34.0)
MCHC: 34.1 g/dL (ref 30.0–36.0)
MCV: 91.3 fL (ref 80.0–100.0)
Platelets: 243 10*3/uL (ref 150–400)
RBC: 2.99 MIL/uL — ABNORMAL LOW (ref 3.87–5.11)
RDW: 17.9 % — ABNORMAL HIGH (ref 11.5–15.5)
WBC: 8 10*3/uL (ref 4.0–10.5)
nRBC: 0 % (ref 0.0–0.2)

## 2020-07-02 LAB — MAGNESIUM: Magnesium: 2 mg/dL (ref 1.7–2.4)

## 2020-07-02 MED ORDER — POTASSIUM CHLORIDE CRYS ER 20 MEQ PO TBCR
40.0000 meq | EXTENDED_RELEASE_TABLET | Freq: Once | ORAL | Status: AC
  Administered 2020-07-02: 40 meq via ORAL
  Filled 2020-07-02: qty 2

## 2020-07-02 NOTE — Care Management Important Message (Signed)
Important Message  Patient Details  Name: Amy [name] MRN: [id number] Date of Birth: 26-Mar-1951   Medicare Important Message Given:  Yes     [name] 07/02/2020, 3:15 PM

## 2020-07-02 NOTE — Progress Notes (Signed)
Physical Therapy Treatment Patient Details Name: Amy [name] MRN: [id number] DOB: 1950/12/03 Today's Date: 07/02/2020    History of Present Illness Amy [name] is a 70 y.o. female with medical history significant of ESRD on PD, HTN, multiple thyroid nodules. She had a hip fracture in December s/p ORIF. Recently hospitalized 1/25/-05/29/20 for watery diarrhea, hyponatremia, hypokalemia. She was found to be covid 19 positive but was not treated with no signs of respiratory involvement. had questionable seizure activity and was transfered to [facility] from [facility]. s/p 06/25/20 Heart cath    PT Comments    Pt making excellent progress towards her goals today. She continues to require max encouragement and cuing however is minA for transfers and ambulation with RW.  Pt can tolerate and will benefit from the rigorous demands of CIR. PT will continue to follow acutely.   Follow Up Recommendations  CIR     Equipment Recommendations  None recommended by PT       Precautions / Restrictions Precautions Precautions: Fall Precaution Comments: weakness, peritoneal diaylisis, swollen arms Restrictions Weight Bearing Restrictions: No    Mobility  Bed Mobility               General bed mobility comments: up in recliner on entry    Transfers Overall transfer level: Needs assistance Equipment used: Rolling walker (2 wheeled) Transfers: Sit to/from [facility] Sit to Stand: Min assist;Mod assist Stand pivot transfers: Min assist       General transfer comment: PT allowed pt to problem solve initial stand from recliner, pt with poor hip, feet and hand placement so requires modA, requires min A at hips to step transfer to [facility]. with maximal cuing pt able to set up for improved stand from [facility] on min physical assist required with additional 2 sit>stands  Ambulation/Gait Ambulation/Gait assistance: Min assist Gait Distance (Feet): 10 Feet (10x 2 with seated rest break) Assistive  device: Rolling walker (2 wheeled) Gait Pattern/deviations: Step-to pattern;Trunk flexed;Shuffle;Decreased stance time - left Gait velocity: slowed Gait velocity interpretation: <1.31 ft/sec, indicative of household ambulator General Gait Details: minA at hips for safety and to improve pt confidence, pt continues with decreased L weightbearing, pt requires max encouragement, however surprised herself that she was able to accomplish         Balance Overall balance assessment: Needs assistance Sitting-balance support: Feet supported;No upper extremity supported Sitting balance-Leahy Scale: Fair     Standing balance support: Single extremity supported;Bilateral upper extremity supported Standing balance-Leahy Scale: Poor Standing balance comment: able to static stand after incontinence of bowels for ~3 min while PT gathered washcloth, and linens, no dizziness or LoB noted                            Cognition Arousal/Alertness: Awake/alert Behavior During Therapy: WFL for tasks assessed/performed Overall Cognitive Status: Impaired/Different from baseline Area of Impairment: Memory;Problem solving                     Memory: Decreased short-term memory (can't remember she has already had headache medicine 2 hours ago)       Problem Solving: Difficulty sequencing;Requires verbal cues;Requires tactile cues;Slow processing           General Comments General comments (skin integrity, edema, etc.): VSS on RA, Facetimed daughter for second bout of ambulation,      Pertinent Vitals/Pain Pain Assessment: Faces Faces Pain Scale: Hurts little more Pain Location: bottom  with sitting, L hip is stiff with weightbearing Pain Descriptors / Indicators: Sore;Discomfort Pain Intervention(s): Limited activity within patient's tolerance;Monitored during session;Repositioned           PT Goals (current goals can now be found in the care plan section) Acute Rehab PT  Goals Patient Stated Goal: home PT Goal Formulation: With patient/family Time For Goal Achievement: 07/05/20 Potential to Achieve Goals: Good Progress towards PT goals: Progressing toward goals    Frequency    Min 3X/week      PT Plan Current plan remains appropriate    Co-evaluation PT/OT/SLP Co-Evaluation/Treatment:  (with chair follow)            AM-PAC PT "6 Clicks" Mobility   Outcome Measure  Help needed turning from your back to your side while in a flat bed without using bedrails?: A Little Help needed moving from lying on your back to sitting on the side of a flat bed without using bedrails?: A Little Help needed moving to and from a bed to a chair (including a wheelchair)?: A Little Help needed standing up from a chair using your arms (e.g., wheelchair or bedside chair)?: A Little Help needed to walk in hospital room?: A Little Help needed climbing 3-5 steps with a railing? : A Lot 6 Click Score: 17    End of Session Equipment Utilized During Treatment: Gait belt Activity Tolerance: Patient tolerated treatment well Patient left: with call bell/phone within reach;in chair Nurse Communication: Mobility status PT Visit Diagnosis: Muscle weakness (generalized) (M62.81);Other abnormalities of gait and mobility (R26.89);Difficulty in walking, not elsewhere classified (R26.2)     Time: 1017-1050 PT Time Calculation (min) (ACUTE ONLY): 33 min  Charges:  $Gait Training: 23-37 mins                     Nitin Mckowen B. Migdalia Dk PT, DPT Acute Rehabilitation Services Pager [phone] Office [phone]    [place] 07/02/2020, 12:52 PM

## 2020-07-02 NOTE — Progress Notes (Signed)
Occupational Therapy Treatment Patient Details Name: [name] MRN: [id number] DOB: June 30, 1950 Today's Date: 07/02/2020    History of present illness Amy [name] is a 70 y.o. female with medical history significant of ESRD on PD, HTN, multiple thyroid nodules. She had a hip fracture in December s/p ORIF. Recently hospitalized 1/25/-05/29/20 for watery diarrhea, hyponatremia, hypokalemia. She was found to be covid 19 positive but was not treated with no signs of respiratory involvement. had questionable seizure activity and was transfered to [facility] from [facility]. s/p 06/25/20 Heart cath, plan for PCI to LAD on 2/25.   OT comments  Patient with incremental progression to stated OT goals.  Patient able to complete seated grooming task with setup only.  She continues to need assist for basic mobility, needing up to Mod A for supine to sit, and Min A for stand pivot transfers.  Barriers continue as listed below.  Patient needs encouragement and positive reinforcement to understand she can do more than she perceives.  OT will continue to see her in the acute setting to maximize her status, and CIR has been recommended.    Follow Up Recommendations  CIR    Equipment Recommendations  None recommended by OT    Recommendations for Other Services      Precautions / Restrictions Precautions Precautions: Fall Restrictions Weight Bearing Restrictions: No       Mobility Bed Mobility Overal bed mobility: Needs Assistance Bed Mobility: Supine to Sit   Sidelying to sit: Min assist;Mod assist       General bed mobility comments: assist for legs and scoot to edge    Transfers Overall transfer level: Needs assistance Equipment used: Rolling walker (2 wheeled) Transfers: Sit to/from [facility] Sit to Stand: Min assist Stand pivot transfers: Min assist            Balance Overall balance assessment: Needs assistance Sitting-balance support: Feet supported;No upper extremity  supported Sitting balance-Leahy Scale: Fair     Standing balance support: Bilateral upper extremity supported Standing balance-Leahy Scale: Poor Standing balance comment: reliant on external assist                           ADL either performed or assessed with clinical judgement   ADL Overall ADL's : Needs assistance/impaired Eating/Feeding: Independent;Sitting   Grooming: Wash/dry hands;Wash/dry face;Set up;Sitting                               Functional mobility during ADLs: Minimal assistance;Rolling walker       Vision Patient Visual Report: No change from baseline                Cognition Arousal/Alertness: Awake/alert Behavior During Therapy: WFL for tasks assessed/performed Overall Cognitive Status: Within Functional Limits for tasks assessed                                                      General Comments  HR to 110 with transfers    Pertinent Vitals/ Pain       Pain Assessment: No/denies pain  Frequency  Min 2X/week        Progress Toward Goals  OT Goals(current goals can now be found in the care plan section)  Progress towards OT goals: Progressing toward goals  Acute Rehab OT Goals Patient Stated Goal: get stronger and back to independent OT Goal Formulation: With patient Time For Goal Achievement: 07/05/20 Potential to Achieve Goals: Good  Plan Discharge plan remains appropriate    Co-evaluation                 AM-PAC OT "6 Clicks" Daily Activity     Outcome Measure   Help from another person eating meals?: None Help from another person taking care of personal grooming?: None Help from another person toileting, which includes using toliet, bedpan, or urinal?: A Lot Help from another person bathing (including washing, rinsing, drying)?: A Lot Help from another person to put on and taking off  regular upper body clothing?: A Little Help from another person to put on and taking off regular lower body clothing?: A Lot 6 Click Score: 17    End of Session Equipment Utilized During Treatment: Gait belt;Rolling walker  OT Visit Diagnosis: Unsteadiness on feet (R26.81);Other abnormalities of gait and mobility (R26.89);Muscle weakness (generalized) (M62.81)   Activity Tolerance Patient tolerated treatment well   Patient Left with call bell/phone within reach;in chair   Nurse Communication Mobility status        Time: 5056-9794 OT Time Calculation (min): 34 min  Charges: OT General Charges $OT Visit: 1 Visit OT Treatments $Self Care/Home Management : 23-37 mins  07/02/2020  Rich, OTR/L  Acute Rehabilitation Services  Office:  [place] 07/02/2020, 8:51 AM

## 2020-07-02 NOTE — Progress Notes (Signed)
Inpatient Rehab Admissions Coordinator:   Met with patient at bedside to discuss potential CIR admission. Pt. Stated interest. Will pursue for potential admit this week, pending bed availability.  Brayten Komar, MS, CCC-SLP Rehab Admissions Coordinator  [phone] (celll) [phone] (office)  

## 2020-07-02 NOTE — Progress Notes (Signed)
Progress Note    Amy [name]  JME:[id number] DOB: 11-Jul-1950  DOA: 06/19/2020 PCP: [name], MD    Brief Narrative:     Medical records reviewed and are as summarized below:  Amy [name] is an 70 y.o. female with medical history significant ofESRD on PD, HTN, multiple thyroid nodules. She had a hip fracture in December s/p ORIF. Recently hospitalized 1/25/-05/29/20 for watery diarrhea, hyponatremia, hypokalemia. She was found to be covid 19 positive but was not treated with no signs of respiratory involvement. She did test positive for C. Diff by PCR and was started on oral Vancomycin with a 9 day Rx at discharge which she was unable to get filled. She also developed sinus tachycardia with NSTEMI for which she was seen by cardiology. She is s/p heart cath and evaluated by CVTS for CABG. Deemed not a candidate so went cath lab 2/25 for stent.  Unable to go to SNF due to PD.  Await CIR bed.  Assessment/Plan:   Active Problems:   Anxiety   Fever blister   Essential hypertension   Hypokalemia   Hyponatremia   HLD (hyperlipidemia)   ESRD on dialysis (HCC)   C. difficile diarrhea   C. difficile colitis   Seizure (HCC)   Pressure injury of skin   Protein-calorie malnutrition, severe   Non-ST elevation (NSTEMI) myocardial infarction ([place])   Ischemic cardiomyopathy   Seizure with postictal state : due to severe electrolyte abnormalities including hyponatremia, hypomagnesemia, hypokalemia caused by C. difficile diarrhea  - MRI negative -EEG shows postictal state, seen by neurology  - Potassium and magnesium now stable after aggressive replacement, has been hydrated with IV fluids - on Keppra  -Neuro recommends DC on Keppra with Neuro follow up -d/c wellbutrin for now-- outpatient follow up   C. difficile infection present on admission.  Being treated with Dificid as she seems to have failed 10-day course of vancomycin (not sure she took?).   -finished course in  hospital as abx to 2/27   Recent COVID-19 incidental infection.  Supportive care.   ESRD (82yrs) .  On PD.  Renal outpatient follow  Anemia of chronic disease.  Anemia panel suggestive of chronic disease, no signs of active bleeding, 1 unit of packed RBC transfusion on 06/22/2020 and again 2/27  sinus tachycardia/ NSTEMI.  - CTA chest and leg ultrasound unremarkable - echocardiogram shows a depressed EF of 25% with typical wall motion abnormalities -s/p cath: Severe multivessel CAD with diffuse 80%, 95% and 80% proximal LAD stenosis, 90% diagonal stenosis;  85% focal ramus intermediate stenosis;  and 50% stenoses in the AV groove circumflex. -CVTS: not a candidate for CABG, unable to get cardiac MRI -cath lab on 2/25 for intervention: stent placed-- ASA/plavix for min of 1 year   Chronic systolic CHF with EF 25 to 30%.   -Fluid removal through PD.   Pressure Injury 06/20/20 Coccyx Medial Stage 1 -  Intact skin with non-blanchable redness of a localized area usually over a bony prominence. (Active)  06/20/20 1500  Location: Coccyx  Location Orientation: Medial  Staging: Stage 1 -  Intact skin with non-blanchable redness of a localized area usually over a bony prominence.  Wound Description (Comments):   Present on Admission: Yes       Family Communication/Anticipated D/C date and plan/Code Status   DVT prophylaxis: scd Code Status: Full Code.  Disposition Plan: Status is: Inpatient  Remains inpatient appropriate because:Inpatient level of care appropriate due to  severity of illness   Dispo: The patient is from: Home              Anticipated d/c is to: CIR when bed available              Anticipated d/c date is:1-2 day              Patient currently is medically stable to d/c.     Medical Consultants:    Cards  CVTS  Renal   Subjective:   Up in chair, no overnight complaints  Objective:    Vitals:   07/02/20 0433 07/02/20 0454 07/02/20 0650 07/02/20 0719   BP: 121/71  129/80 123/82  Pulse: 89  89 92  Resp: 12  12 16   Temp: 98.6 F (37 C)  98 F (36.7 C) 98.3 F (36.8 C)  TempSrc: Axillary  Oral Oral  SpO2: 98%  97% 98%  Weight:  51.4 kg 50.7 kg   Height:        Intake/Output Summary (Last 24 hours) at 07/02/2020 1115 Last data filed at 07/02/2020 1000 Gross per 24 hour  Intake 803 ml  Output 100 ml  Net 703 ml   Filed Weights   07/01/20 1555 07/02/20 0454 07/02/20 0650  Weight: 54.7 kg 51.4 kg 50.7 kg    Exam:  In chair, NAD rrr No increased work of breathing Pleasant and cooperative-- slow speech    Data Reviewed:   I have personally reviewed following labs and imaging studies:  Labs: Labs show the following:   Basic Metabolic Panel: Recent Labs  Lab 06/26/20 0107 06/27/20 0026 06/28/20 0121 06/29/20 0111 06/30/20 0046 07/02/20 0052  NA 129* 130* 130* 131* 131* 132*  K 4.1 3.5 4.0 3.7 4.0 3.4*  CL 97* 96* 98 97* 99 99  CO2 21* 23 22 23  21* 18*  GLUCOSE 125* 104* 108* 100* 114* 90  BUN 30* 29* 32* 29* 30* 28*  CREATININE 4.02* 4.19* 4.63* 4.65* 4.75* 4.68*  CALCIUM 8.1* 8.4* 8.2* 8.3* 8.2* 8.7*  MG 1.8 1.8  --   --  1.6* 2.0  PHOS  --  3.1  --  3.9 3.4  --    GFR Estimated Creatinine Clearance: 8.8 mL/min (A) (by C-G formula based on SCr of 4.68 mg/dL (H)). Liver Function Tests: Recent Labs  Lab 06/26/20 0107 06/27/20 0026 06/29/20 0111 06/30/20 0046  AST 20 19  --   --   ALT 18 21  --   --   ALKPHOS 91 77  --   --   BILITOT 0.2* 0.3  --   --   PROT 3.6* 3.6*  --   --   ALBUMIN 1.6* 1.6* 1.4* 1.4*   No results for input(s): LIPASE, AMYLASE in the last 168 hours. Recent Labs  Lab 06/29/20 1042  AMMONIA 74*   Coagulation profile No results for input(s): INR, PROTIME in the last 168 hours.  CBC: Recent Labs  Lab 06/26/20 0107 06/27/20 0026 06/27/20 1949 06/28/20 0121 06/29/20 0111 06/29/20 1042 06/30/20 0046 07/02/20 0052  WBC 10.5 8.7 9.8 8.8 7.3  --  10.4 8.0  NEUTROABS 8.4*  6.3  --   --   --   --   --   --   HGB 8.8* 8.1* 7.5* 7.5* 6.7* 11.0* 9.8* 9.3*  HCT 25.6* 24.0* 22.8* 22.3* 19.1* 33.1* 29.4* 27.3*  MCV 88.9 88.9 91.2 90.3 89.7  --  89.9 91.3  PLT 271 246 236 238 238  --  227 243   Cardiac Enzymes: No results for input(s): CKTOTAL, CKMB, CKMBINDEX, TROPONINI in the last 168 hours. BNP (last 3 results) No results for input(s): PROBNP in the last 8760 hours. CBG: No results for input(s): GLUCAP in the last 168 hours. D-Dimer: No results for input(s): DDIMER in the last 72 hours. Hgb A1c: No results for input(s): HGBA1C in the last 72 hours. Lipid Profile: No results for input(s): CHOL, HDL, LDLCALC, TRIG, CHOLHDL, LDLDIRECT in the last 72 hours. Thyroid function studies: No results for input(s): TSH, T4TOTAL, T3FREE, THYROIDAB in the last 72 hours.  Invalid input(s): FREET3 Anemia work up: No results for input(s): VITAMINB12, FOLATE, FERRITIN, TIBC, IRON, RETICCTPCT in the last 72 hours. Sepsis Labs: Recent Labs  Lab 06/28/20 0121 06/29/20 0111 06/30/20 0046 07/02/20 0052  WBC 8.8 7.3 10.4 8.0    Microbiology No results found for this or any previous visit (from the past 240 hour(s)).  Procedures and diagnostic studies:  No results found.  Medications:   . sodium chloride   Intravenous Once  . acetaminophen  650 mg Oral Once  . aspirin  81 mg Oral Daily  . atorvastatin  80 mg Oral q1800  . Chlorhexidine Gluconate Cloth  6 each Topical Daily  . cinacalcet  30 mg Oral Q breakfast  . clopidogrel  75 mg Oral Daily  . darbepoetin (ARANESP) injection - NON-DIALYSIS  100 mcg Subcutaneous Q Sat-1800  . feeding supplement  237 mL Oral TID BM  . gentamicin cream  1 application Topical Daily  . levETIRAcetam  500 mg Oral BID  . metoprolol succinate  25 mg Oral Daily  . multivitamin  1 tablet Oral QHS  . pantoprazole  40 mg Oral Daily  . potassium chloride  10 mEq Oral Daily  . sodium chloride flush  3 mL Intravenous Once  . sodium  chloride flush  3 mL Intravenous Q12H  . sodium chloride flush  3 mL Intravenous Q12H  . torsemide  100 mg Oral BID   Continuous Infusions: . sodium chloride       LOS: 13 days   Geradine Girt  [facility]   How to contact the [facility] Attending or Consulting provider [place] or covering provider during after hours [place], for this patient?  1. Check the care team in [facility] and look for a) attending/consulting [facility] provider listed and b) the [facility] team listed 2. Log into [email or web address] and use 's universal password to access. If you do not have the password, please contact the hospital operator. 3. Locate the [facility] provider you are looking for under [facility] and page to a number that you can be directly reached. 4. If you still have difficulty reaching the provider, please page the [facility] (Director on Call) for the Hospitalists listed on amion for assistance.  07/02/2020, 11:15 AM

## 2020-07-02 NOTE — Progress Notes (Addendum)
Subjective:  No cos ,said tolerated PD last hs, PT in room  To start PT this am   Objective Vital signs in last 24 hours: Vitals:   07/02/20 0433 07/02/20 0454 07/02/20 0650 07/02/20 0719  BP: 121/71  129/80 123/82  Pulse: 89  89 92  Resp: 12  12 16   Temp: 98.6 F (37 C)  98 F (36.7 C) 98.3 F (36.8 C)  TempSrc: Axillary  Oral Oral  SpO2: 98%  97% 98%  Weight:  51.4 kg 50.7 kg   Height:       Weight change: 0.9 kg  Physical Exam: General: Calm, Alert elderly female, NAD Heart: RRR, no MRG Lungs: CTA, room air nonlabored breathing Abdomen: Positive BS, soft , ND, NT, PD catheter dressing dry Extremities: Bilateral trace pedal  edema Dialysis Access: PD catheter dressing dry and clear   OP  Dialysis = Home PD in [facility] followed by Dr. Candiss Norse  Problem/Plan: 1. Multivessel CAD with reduced EF 25/30% =Card Following, noted Eval by TCTS  "poor CABG candidate"  status post PCI to LAD with DES/on Plavix and ASA 81 mg, high-dose statin 2. ESRD -stable on PD, also on torsemide 100 mg daily with some results per patient minimal documentation I/Os.  K = 3.4  Noted on PO 15meq po K  supplement BID   has not received am po k yet has banana   in room  To eat , follow-up K  trend 3. HTN/volume -BP 132 /82 this am  last PD  1.56 L UF , on metoprolol 25 mg daily and okay to start losartan 25 mg as outpatient per cardiology recommendation follow-up BP  trend with PD to avoid hypotension 4. Anemia -Hgb dropped to 6.7 status post 1 unit PRBCs, Hgb 9.3  this  am  status post Aranesp 100 MCG q. Saturday,/noted iron sat 91% ,ferritin 1131 no iron needed  5. Secondary hyperparathyroidism no binder listed, on Sensipar 30, stopped Rocaltrol with elevated calcium>10  6. Nutrition= need renal diet Albumin low at 1.4, noted on Ensure 3 times daily, with ESRD patient follow-up labs  7. Disposition= awaiting CIR admit for rehab  Ernest Haber, PA-C [place]  [phone] 07/02/2020,8:15 AM  LOS: 13 days   Labs: Basic Metabolic Panel: Recent Labs  Lab 06/27/20 0026 06/28/20 0121 06/29/20 0111 06/30/20 0046 07/02/20 0052  NA 130*   < > 131* 131* 132*  K 3.5   < > 3.7 4.0 3.4*  CL 96*   < > 97* 99 99  CO2 23   < > 23 21* 18*  GLUCOSE 104*   < > 100* 114* 90  BUN 29*   < > 29* 30* 28*  CREATININE 4.19*   < > 4.65* 4.75* 4.68*  CALCIUM 8.4*   < > 8.3* 8.2* 8.7*  PHOS 3.1  --  3.9 3.4  --    < > = values in this interval not displayed.   Liver Function Tests: Recent Labs  Lab 06/26/20 0107 06/27/20 0026 06/29/20 0111 06/30/20 0046  AST 20 19  --   --   ALT 18 21  --   --   ALKPHOS 91 77  --   --   BILITOT 0.2* 0.3  --   --   PROT 3.6* 3.6*  --   --   ALBUMIN 1.6* 1.6* 1.4* 1.4*   No results for input(s): LIPASE, AMYLASE in the last 168 hours. Recent Labs  Lab 06/29/20  1042  AMMONIA 74*   CBC: Recent Labs  Lab 06/26/20 0107 06/27/20 0026 06/27/20 1949 06/28/20 0121 06/29/20 0111 06/29/20 1042 06/30/20 0046 07/02/20 0052  WBC 10.5 8.7 9.8 8.8 7.3  --  10.4 8.0  NEUTROABS 8.4* 6.3  --   --   --   --   --   --   HGB 8.8* 8.1* 7.5* 7.5* 6.7* 11.0* 9.8* 9.3*  HCT 25.6* 24.0* 22.8* 22.3* 19.1* 33.1* 29.4* 27.3*  MCV 88.9 88.9 91.2 90.3 89.7  --  89.9 91.3  PLT 271 246 236 238 238  --  227 243   Cardiac Enzymes: No results for input(s): CKTOTAL, CKMB, CKMBINDEX, TROPONINI in the last 168 hours. CBG: No results for input(s): GLUCAP in the last 168 hours.  Studies/Results: No results found. Medications: . sodium chloride     . sodium chloride   Intravenous Once  . acetaminophen  650 mg Oral Once  . aspirin  81 mg Oral Daily  . atorvastatin  80 mg Oral q1800  . Chlorhexidine Gluconate Cloth  6 each Topical Daily  . cinacalcet  30 mg Oral Q breakfast  . clopidogrel  75 mg Oral Daily  . darbepoetin (ARANESP) injection - NON-DIALYSIS  100 mcg Subcutaneous Q Sat-1800  . feeding supplement  237 mL Oral TID BM  .  gentamicin cream  1 application Topical Daily  . levETIRAcetam  500 mg Oral BID  . metoprolol succinate  25 mg Oral Daily  . multivitamin  1 tablet Oral QHS  . pantoprazole  40 mg Oral Daily  . potassium chloride  10 mEq Oral Daily  . sodium chloride flush  3 mL Intravenous Once  . sodium chloride flush  3 mL Intravenous Q12H  . sodium chloride flush  3 mL Intravenous Q12H  . torsemide  100 mg Oral BID    I have seen and examined this patient and agree with plan and assessment in the above note with renal recommendations/intervention highlighted.  Feeling better but still weak.  Governor R[place],MD 07/02/2020 12:59 PM

## 2020-07-02 NOTE — Progress Notes (Signed)
Patient refused to walk in the afternoon because she doesn't feel good. Patient c/o runny nose, sneeze and warmness. Temp was 99 which was higher than usual. Continue to monitor patient's symptoms. HS [employer]

## 2020-07-02 NOTE — Progress Notes (Signed)
Gave a report to 7M Scientist, water quality at [employer]. a awaiting for more than hour then transferred patient to 7M. Informed nurse to call HD nurse for PD tonight. PD machine moved to 7M with gentamicin cream. Informed pt's daughter Jarrett Soho regarding transferring to 7M. HS [employer]

## 2020-07-03 DIAGNOSIS — Z992 Dependence on renal dialysis: Secondary | ICD-10-CM | POA: Diagnosis not present

## 2020-07-03 DIAGNOSIS — N186 End stage renal disease: Secondary | ICD-10-CM | POA: Diagnosis not present

## 2020-07-03 LAB — RENAL FUNCTION PANEL
Albumin: 1.5 g/dL — ABNORMAL LOW (ref 3.5–5.0)
Anion gap: 12 (ref 5–15)
BUN: 27 mg/dL — ABNORMAL HIGH (ref 8–23)
CO2: 21 mmol/L — ABNORMAL LOW (ref 22–32)
Calcium: 8.9 mg/dL (ref 8.9–10.3)
Chloride: 98 mmol/L (ref 98–111)
Creatinine, Ser: 4.71 mg/dL — ABNORMAL HIGH (ref 0.44–1.00)
GFR, Estimated: 9 mL/min — ABNORMAL LOW (ref >60)
Glucose, Bld: 100 mg/dL — ABNORMAL HIGH (ref 70–99)
Phosphorus: 3 mg/dL (ref 2.5–4.6)
Potassium: 4 mmol/L (ref 3.5–5.1)
Sodium: 131 mmol/L — ABNORMAL LOW (ref 135–145)

## 2020-07-03 NOTE — Progress Notes (Signed)
IP rehab admissions - I met with patient today and she would like inpatient rehab stay.  Pending bed availability, will try to get patient a bed on CIR in the next 1-2 days.  Call for questions.  [phone]

## 2020-07-03 NOTE — PMR Pre-admission (Signed)
PMR Admission Coordinator Pre-Admission Assessment  Patient: Amy [name] is an 70 y.o., female MRN: [id number] DOB: April 02, 1951 Height: 5' 4"  (162.6 cm) Weight: 50.7 kg  Insurance Information HMO:     PPO:      PCP:      IPA:      80/20:      OTHER:  PRIMARY: Medicare A and B      Policy#: [id number]      Subscriber: patient CM Name:        Phone#:       Fax#:   Pre-Cert#:        Employer: Retired Benefits:  Phone #:      Name: Checked in passport one source [employer] Date: Medicare A effective 10/31/13 and Medicare B effective 04/02/16     Deduct: $2563      Out of Pocket Max: None      Life Max: N/A CIR: 100%      SNF: 100 days Outpatient: 80%     Co-Pay: 20% Home Health: 100%      Co-Pay: none DME: 80%     Co-Pay: 20% Providers: patient's choice  SECONDARY: [employer] supplement      Policy#: [id number]     Phone#: [phone]  Financial Counselor:        Phone#:    The "Data Collection Information Summary" for patients in Inpatient Rehabilitation Facilities with attached "Privacy Act [place] Records" was provided and verbally reviewed with: Patient  Emergency Contact Information Contact Information    Name Relation Home Work Mobile   Isaza,[name] Spouse   [place] Daughter [phone]  [phone]      Current Medical History  Patient Admitting Diagnosis: Debility  History of Present Illness: 70 y.o.femalewith medical history significant ofESRD on PD, HTN, multiple thyroid nodules. She had a hip fracture in December s/p ORIF. Recently hospitalized 1/25/-05/29/20 for watery diarrhea, hyponatremia, hypokalemia. She was found to be covid 19 positive (05/29/20) but was not treated with no signs of respiratory involvement. She did test positive for C. Diff by PCR and was started on oral Vancomycin with a 9 day Rx at discharge which she was unable to get filled.She also developed sinus tachycardia with NSTEMI for which she was seen by cardiology.  She is s/p heart cath and evaluated by CVTS for CABG. Deemed not a candidate so went cath lab 2/25 for stent. Unable to go to SNF due to PD. Await CIR bed.   Complete NIHSS TOTAL: 6  Patient's medical record from [facility] has been reviewed by the rehabilitation admission coordinator and physician.  Past Medical History  Past Medical History:  Diagnosis Date  . Anemia    a. 08/2016 Guaiac + stool. EGD w/ gastritis.  . Anxiety   . CKD (chronic kidney disease), stage III ([place])   . Closed fracture of shaft of left ulna 11/27/2018  . Colon polyp    a. 08/2016 Colonoscopy.  . Gastritis    a. 08/2016 EGD: Gastritis-->PPI.  [name] GERD (gastroesophageal reflux disease)   . History of chicken pox   . History of measles as a child   . Hypertension   . Hyponatremia    a. 08/2016 in setting of HCTZ Rx.  . Mesenteric lymphadenopathy 04/08/2017  . Multiple thyroid nodules     Family History   family history includes Cancer in her paternal grandfather; Congestive Heart Failure in her maternal grandmother; Heart disease in her maternal uncle; Pancreatic cancer in her father.  Prior Rehab/Hospitalizations Has the patient had prior rehab or hospitalizations prior to admission? Yes  Has the patient had major surgery during 100 days prior to admission? Yes   Current Medications  Current Facility-Administered Medications:  .  0.9 %  sodium chloride infusion (Manually program via Guardrails IV Fluids), , Intravenous, Once, Thurnell Lose, MD, Held at 06/22/20 [phone] .  0.9 %  sodium chloride infusion, 250 mL, Intravenous, PRN, Sherren Mocha, MD .  acetaminophen (TYLENOL) tablet 650 mg, 650 mg, Oral, Q4H PRN, Geradine Girt, DO, 650 mg at 06/27/20 2050 .  acetaminophen (TYLENOL) tablet 650 mg, 650 mg, Oral, Once, Chotiner, Yevonne Aline, MD .  aspirin chewable tablet 81 mg, 81 mg, Oral, Daily, Troy Sine, MD, 81 mg at 07/04/20 1006 .  atorvastatin (LIPITOR) tablet 80 mg, 80 mg, Oral, q1800,  Troy Sine, MD, 80 mg at 07/03/20 1831 .  butalbital-acetaminophen-caffeine (FIORICET) 50-325-40 MG per tablet 1-2 tablet, 1-2 tablet, Oral, Q6H PRN, Eulogio Bear U, DO, 2 tablet at 07/04/20 1006 .  Chlorhexidine Gluconate Cloth 2 % PADS 6 each, 6 each, Topical, Daily, Norins, Heinz Knuckles, MD, 6 each at 07/03/20 1400 .  cinacalcet (SENSIPAR) tablet 30 mg, 30 mg, Oral, Q breakfast, Reesa Chew, MD, 30 mg at 07/04/20 0751 .  clopidogrel (PLAVIX) tablet 75 mg, 75 mg, Oral, Daily, Lyda Jester M, PA-C, 75 mg at 07/04/20 1006 .  Darbepoetin Alfa (ARANESP) injection 100 mcg, 100 mcg, Subcutaneous, Q Sat-1800, Claudia Desanctis, MD, 100 mcg at 06/28/20 1712 .  diazepam (VALIUM) tablet 5 mg, 5 mg, Oral, Q6H PRN, Troy Sine, MD, 5 mg at 07/01/20 0001 .  feeding supplement (ENSURE ENLIVE / ENSURE PLUS) liquid 237 mL, 237 mL, Oral, TID BM, Vann, Jessica U, DO, 237 mL at 07/02/20 2236 .  gentamicin cream (GARAMYCIN) 0.1 % 1 application, 1 application, Topical, Daily, Reesa Chew, MD, 1 application at 73/71/06 2236 .  Gerhardt's butt cream, , Topical, PRN, Thurnell Lose, MD, Given at 06/22/20 1616 .  heparin 2,500 Units in dialysis solution 2.5% low-MG/low-CA 5,000 mL dialysis solution, , Peritoneal Dialysis, PRN, Reesa Chew, MD .  levETIRAcetam (KEPPRA) tablet 500 mg, 500 mg, Oral, BID, Vann, Jessica U, DO, 500 mg at 07/04/20 1006 .  metoprolol succinate (TOPROL-XL) 24 hr tablet 25 mg, 25 mg, Oral, Daily, Larey Dresser, MD, 25 mg at 07/04/20 1006 .  multivitamin (RENA-VIT) tablet 1 tablet, 1 tablet, Oral, QHS, Thurnell Lose, MD, 1 tablet at 07/03/20 2144 .  ondansetron (ZOFRAN) injection 4 mg, 4 mg, Intravenous, Q6H PRN, Troy Sine, MD, 4 mg at 06/25/20 2341 .  pantoprazole (PROTONIX) EC tablet 40 mg, 40 mg, Oral, Daily, Vann, Jessica U, DO, 40 mg at 07/04/20 1006 .  potassium chloride (KLOR-CON) CR tablet 10 mEq, 10 mEq, Oral, Daily, Claudia Desanctis, MD, 10 mEq at  07/04/20 1006 .  sodium chloride flush (NS) 0.9 % injection 3 mL, 3 mL, Intravenous, Once, Carmin Muskrat, MD .  sodium chloride flush (NS) 0.9 % injection 3 mL, 3 mL, Intravenous, Q12H, Kathyrn Drown D, NP, 3 mL at 06/30/20 1123 .  sodium chloride flush (NS) 0.9 % injection 3 mL, 3 mL, Intravenous, Q12H, Sherren Mocha, MD, 3 mL at 07/03/20 2144 .  sodium chloride flush (NS) 0.9 % injection 3 mL, 3 mL, Intravenous, PRN, Sherren Mocha, MD .  torsemide [facility]) tablet 100 mg, 100 mg, Oral, BID, Vann, Jessica U, DO, 100 mg at  07/04/20 0752 .  traZODone (DESYREL) tablet 25 mg, 25 mg, Oral, QHS PRN, Norins, Heinz Knuckles, MD, 25 mg at 07/03/20 2142  Patients Current Diet:  Diet Order            DIET DYS 3 Room service appropriate? Yes; Fluid consistency: Thin  Diet effective now                 Precautions / Restrictions Precautions Precautions: Fall Precaution Comments: weakness, peritoneal diaylisis, swollen arms Restrictions Weight Bearing Restrictions: No   Has the patient had 2 or more falls or a fall with injury in the past year? Yes  Prior Activity Level Limited Community (1-2x/wk): Went out to dinner 2-3 X a week.  Has not driven since Dec 1610 due to hip fracture.  Prior Functional Level Self Care: Did the patient need help bathing, dressing, using the toilet or eating? Needed some help  Indoor Mobility: Did the patient need assistance with walking from room to room (with or without device)? Needed some help  Stairs: Did the patient need assistance with internal or external stairs (with or without device)? Needed some help  Functional Cognition: Did the patient need help planning regular tasks such as shopping or remembering to take medications? Independent  Home Assistive Devices / Miner Devices/Equipment: Environmental consultant (specify type) Home Equipment: Walker - 2 wheels,Cane - single point,Bedside commode,Shower seat  Prior Device Use: Indicate  devices/aids used by the patient prior to current illness, exacerbation or injury? Walker  Current Functional Level Cognition  Overall Cognitive Status: Impaired/Different from baseline Orientation Level: Oriented X4 General Comments: Pt is tired of hospitalization and all things associated with being "stuck" in one room. - potentially we could take her outside in [facility] next session?    Extremity Assessment (includes Sensation/Coordination)  Upper Extremity Assessment: RUE deficits/detail,LUE deficits/detail RUE Deficits / Details: Bil UE edema from not being on peritoneal dialysis for several days. Also generalized weakness throughout, full PROM but needs A for shoulder ROM;elbow distally 3/5 except digits due to edema RUE Coordination: decreased fine motor,decreased gross motor LUE Deficits / Details: Bil UE edema from not being on peritoneal dialysis for several days. Also generalized weakness throughout, full PROM but needs A for shoulder ROM;elbow distally 3/5 except digits due to edema LUE Coordination: decreased fine motor,decreased gross motor  Lower Extremity Assessment: Generalized weakness    ADLs  Overall ADL's : Needs assistance/impaired Eating/Feeding: Independent,Sitting Eating/Feeding Details (indicate cue type and reason): due to edema in arms/hands Grooming: Wash/dry hands,Wash/dry face,Set up,Sitting Grooming Details (indicate cue type and reason): BSC Upper Body Bathing: Maximal assistance,Bed level Lower Body Bathing: Maximal assistance Lower Body Bathing Details (indicate cue type and reason): min A sit<>stand Upper Body Dressing : Maximal assistance,Sitting Lower Body Dressing: Total assistance Lower Body Dressing Details (indicate cue type and reason): min A sit<>stand Toilet Transfer: Moderate assistance,Stand-pivot,BSC Toilet Transfer Details (indicate cue type and reason): one person face to face transfer Hoffman and Hygiene: Maximal  assistance,Sit to/from stand Toileting - Clothing Manipulation Details (indicate cue type and reason): Pt able to stand while OT performed gentle peri care Functional mobility during ADLs: Minimal assistance,Rolling walker General ADL Comments: decreased activity tolerance/balance    Mobility  Overal bed mobility: Needs Assistance Bed Mobility: Supine to Sit Rolling: Supervision Sidelying to sit: Min assist,Mod assist Supine to sit: Min assist Sit to supine: Mod assist General bed mobility comments: up in recliner on entry    Transfers  Overall transfer level:  Needs assistance Equipment used: Rolling walker (2 wheeled) Transfers: Sit to/from [employer] Sit to Stand: Min assist,Mod assist Stand pivot transfers: Min assist General transfer comment: PT allowed pt to problem solve initial stand from recliner, pt with poor hip, feet and hand placement so requires modA, requires min A at hips to step transfer to [facility]. with maximal cuing pt able to set up for improved stand from [facility] on min physical assist required with additional 2 sit>stands    Ambulation / Gait / Stairs / Wheelchair Mobility  Ambulation/Gait Ambulation/Gait assistance: Herbalist (Feet): 10 Feet (10x 2 with seated rest break) Assistive device: Rolling walker (2 wheeled) Gait Pattern/deviations: Step-to pattern,Trunk flexed,Shuffle,Decreased stance time - left General Gait Details: minA at hips for safety and to improve pt confidence, pt continues with decreased L weightbearing, pt requires max encouragement, however surprised herself that she was able to accomplish Gait velocity: slowed Gait velocity interpretation: <1.31 ft/sec, indicative of household ambulator    Posture / Balance Dynamic Sitting Balance Sitting balance - Comments: able to sit EOB without PT assist Balance Overall balance assessment: Needs assistance Sitting-balance support: Feet supported,No upper extremity  supported Sitting balance-Leahy Scale: Fair Sitting balance - Comments: able to sit EOB without PT assist Standing balance support: Single extremity supported,Bilateral upper extremity supported Standing balance-Leahy Scale: Poor Standing balance comment: able to static stand after incontinence of bowels for ~3 min while PT gathered washcloth, and linens, no dizziness or LoB noted    Special needs/care consideration Dialysis: Peritoneal, Skin Healing incision right hip and Special service needs On PD at night   Previous Home Environment (from acute therapy documentation) Living Arrangements: Spouse/significant other Available Help at Discharge: Family,Available 24 hours/day Type of Home: House Home Layout: Able to live on main level with bedroom/bathroom,Two level Home Access: Stairs to enter Entrance Stairs-Rails: None Entrance Stairs-Number of Steps: 2 Bathroom Shower/Tub: Multimedia programmer: Standard Home Care Services: No  Discharge Living Setting Plans for Discharge Living Setting: Patient's home,House,Lives with (comment) (Lives with husband) Type of Home at Discharge: House Discharge Home Layout: Two level,Able to live on main level with bedroom/bathroom (does not have to go up to 2nd level) Alternate Level Stairs-Number of Steps: Flight Discharge Home Access: Stairs to enter Entrance Stairs-Rails: None Entrance Stairs-Number of Steps: 2 (2 steps from garage to [employer]) Discharge Bathroom Shower/Tub: Walk-in shower,Door Discharge Bathroom Toilet: Handicapped height Discharge Bathroom Accessibility: Yes How Accessible: Accessible via walker Does the patient have any problems obtaining your medications?: No  Social/Family/Support Systems Patient Roles: Spouse,Parent (Has husband of 2 years and a daughter) Contact Information: Kathelyn Gombos - husband Anticipated Caregiver: Husband and daughter Ability/Limitations of Caregiver: Husband works 730 am to 5  pm Caregiver Availability: 24/7 (Can get caregiver assistance 24/7) Discharge Plan Discussed with Primary Caregiver: Yes Is Caregiver In Agreement with Plan?: Yes Does Caregiver/Family have Issues with Lodging/Transportation while Pt is in Rehab?: No  Goals Patient/Family Goal for Rehab: PT/OT Supervision goals Expected length of stay: 7-10 days Cultural Considerations: None Pt/Family Agrees to Admission and willing to participate: Yes Program Orientation Provided & Reviewed with Pt/Caregiver Including Roles  & Responsibilities: Yes  Decrease burden of Care through IP rehab admission: N/A  Possible need for SNF placement upon discharge: Not anticipated  Patient Condition: I have reviewed medical records from [facility], spoken with CM, and patient. I met with patient at the bedside for inpatient rehabilitation assessment.  Patient will benefit from ongoing PT and OT, can  actively participate in 3 hours of therapy a day 5 days of the week, and can make measurable gains during the admission.  Patient will also benefit from the coordinated team approach during an Inpatient Acute Rehabilitation admission.  The patient will receive intensive therapy as well as Rehabilitation physician, nursing, social worker, and care management interventions.  Due to bladder management, bowel management, safety, skin/wound care, disease management, medication administration, pain management and patient education the patient requires 24 hour a day rehabilitation nursing.  The patient is currently Min assist with mobility and basic ADLs.  Discharge setting and therapy post discharge at home with home health is anticipated.  Patient has agreed to participate in the Acute Inpatient Rehabilitation Program and will admit today.  Preadmission Screen Completed By: Retta Diones with day of admission updates by Bethel Born, 07/04/2020 10:16 AM ______________________________________________________________________    Discussed status with Dr. Naaman Plummer on 07/04/20  at 10:16 AM and received approval for admission today.  Admission Coordinator: Retta Diones, RN with day of admission updates by Bethel Born, CCC-SLP, time 10:16 AM Sudie Grumbling 07/04/20    Assessment/Plan: Diagnosis: debility after multiple medical 1. Does the need for close, 24 hr/day Medical supervision in concert with the patient's rehab needs make it unreasonable for this patient to be served in a less intensive setting? Yes 2. Co-Morbidities requiring supervision/potential complications: ESRD/PD, htn, c diff diarrhea, NSTEMI 3. Due to bladder management, bowel management, safety, skin/wound care, disease management, medication administration, pain management and patient education, does the patient require 24 hr/day rehab nursing? Yes 4. Does the patient require coordinated care of a physician, rehab nurse, PT, OT, and SLP to address physical and functional deficits in the context of the above medical diagnosis(es)? Yes Addressing deficits in the following areas: balance, endurance, locomotion, strength, transferring, bowel/bladder control, bathing, dressing, feeding, grooming, toileting and psychosocial support 5. Can the patient actively participate in an intensive therapy program of at least 3 hrs of therapy 5 days a week? Yes 6. The potential for patient to make measurable gains while on inpatient rehab is excellent 7. Anticipated functional outcomes upon discharge from inpatient rehab: supervision PT, supervision OT, n/a SLP 8. Estimated rehab length of stay to reach the above functional goals is: 7-10 days 9. Anticipated discharge destination: Home 10. Overall Rehab/Functional Prognosis: excellent   MD Signature: Meredith Staggers, MD, [facility] 07/04/2020

## 2020-07-03 NOTE — Progress Notes (Addendum)
[facility] Progress Note   Subjective:   Patient seen and examined at bedside.  Reports PD went well overnight.  States edema is improving.  Denies CP, SOB, n/v/d.  Still feeling weak and tired.  Waiting for bed in CIR.   Objective Vitals:   07/02/20 1917 07/02/20 2000 07/03/20 0518 07/03/20 0903  BP:  114/64 119/76 134/80  Pulse: 92  90 95  Resp:  16 18 18   Temp:   98.3 F (36.8 C) 98.2 F (36.8 C)  TempSrc:  Oral    SpO2: 99%  95% 98%  Weight:      Height:       Physical Exam General:chronically ill appearing female in NAD Heart:RRR, no mrg Lungs:mostly CTAB, no wheezes, rales or rhonchi Abdomen:soft, NTND, PD cath in RLQ Extremities:1-2+LE edema from hips to feet Dialysis Access: PD cath in RLQ c/d/i   Filed Weights   07/01/20 1555 07/02/20 0454 07/02/20 0650  Weight: 54.7 kg 51.4 kg 50.7 kg    Intake/Output Summary (Last 24 hours) at 07/03/2020 1015 Last data filed at 07/03/2020 0836 Gross per 24 hour  Intake 660 ml  Output 0 ml  Net 660 ml    Additional Objective Labs: Basic Metabolic Panel: Recent Labs  Lab 06/29/20 0111 06/30/20 0046 07/02/20 0052 07/03/20 0301  NA 131* 131* 132* 131*  K 3.7 4.0 3.4* 4.0  CL 97* 99 99 98  CO2 23 21* 18* 21*  GLUCOSE 100* 114* 90 100*  BUN 29* 30* 28* 27*  CREATININE 4.65* 4.75* 4.68* 4.71*  CALCIUM 8.3* 8.2* 8.7* 8.9  PHOS 3.9 3.4  --  3.0   Liver Function Tests: Recent Labs  Lab 06/27/20 0026 06/29/20 0111 06/30/20 0046 07/03/20 0301  AST 19  --   --   --   ALT 21  --   --   --   ALKPHOS 77  --   --   --   BILITOT 0.3  --   --   --   PROT 3.6*  --   --   --   ALBUMIN 1.6* 1.4* 1.4* 1.5*   CBC: Recent Labs  Lab 06/27/20 0026 06/27/20 1949 06/28/20 0121 06/29/20 0111 06/29/20 1042 06/30/20 0046 07/02/20 0052  WBC 8.7 9.8 8.8 7.3  --  10.4 8.0  NEUTROABS 6.3  --   --   --   --   --   --   HGB 8.1* 7.5* 7.5* 6.7* 11.0* 9.8* 9.3*  HCT 24.0* 22.8* 22.3* 19.1* 33.1* 29.4* 27.3*  MCV  88.9 91.2 90.3 89.7  --  89.9 91.3  PLT 246 236 238 238  --  227 243    Medications:  sodium chloride      sodium chloride   Intravenous Once   acetaminophen  650 mg Oral Once   aspirin  81 mg Oral Daily   atorvastatin  80 mg Oral q1800   Chlorhexidine Gluconate Cloth  6 each Topical Daily   cinacalcet  30 mg Oral Q breakfast   clopidogrel  75 mg Oral Daily   darbepoetin (ARANESP) injection - NON-DIALYSIS  100 mcg Subcutaneous Q Sat-1800   feeding supplement  237 mL Oral TID BM   gentamicin cream  1 application Topical Daily   levETIRAcetam  500 mg Oral BID   metoprolol succinate  25 mg Oral Daily   multivitamin  1 tablet Oral QHS   pantoprazole  40 mg Oral Daily   potassium chloride  10 mEq Oral  Daily   sodium chloride flush  3 mL Intravenous Once   sodium chloride flush  3 mL Intravenous Q12H   sodium chloride flush  3 mL Intravenous Q12H   torsemide  100 mg Oral BID    Dialysis Orders:  [place] followed by Dr. Candiss Norse  Assessment/Plan: 1. Multivessel CAD with reduced EF 25/30%: HF team following. Eval by TCTS"poor CABG candidate" status post PCI to LAD with DES on 2/25.  on Plavix and ASA 81 mg, high-dose statin 2. ESRD -stable on PD, also on torsemide 100 mg daily with some results per patient minimal documentation I/Os.last K improved to 4.0. Continue KCl 64meq PO BID.   3. HTN/volume -BP in goal, on metoprolol 25 mg daily and okay to start losartan 25 mg as outpatient per cardiology recommendation.  Continue torsemide and PD to reduce volume.  Per patient improved.  4. Anemia -Hgb^9.3 s/p 1 unitPRBCs on 2/20 and again 2/27.  Continue  Aranesp 100 MCG q Saturday. t sat 91% ,ferritin 1131.  No need for iron at this time. 5. Secondary hyperparathyroidismno binder listed, on Sensipar 30,stoppedRocaltrol with elevated corrected calcium>10 6. Nutrition: Currently on dysphagia 3 diet.  Albumin low at 1.4, noted on Ensure 3 times  daily,follow labs and change to renal diet if K or phos start to trend up.  7. Seizures - do to electrolyte abnormality in setting of C diff.  Now on Keppra with neruo f/u. 8. C diff - finished ABX course on 2/27.  9. Disposition-CIR admit for rehab   Jen Mow, PA-C [facility] 07/03/2020,10:15 AM  LOS: 14 days   I have seen and examined this patient and agree with plan and assessment in the above note with renal recommendations/intervention highlighted.  Doing well with [name], no changes. [place] John A Annastyn Silvey,MD 07/03/2020 11:19 AM

## 2020-07-03 NOTE — Plan of Care (Signed)
  Problem: Activity: Goal: Risk for activity intolerance will decrease Outcome: Progressing   Problem: Elimination: Goal: Will not experience complications related to urinary retention Outcome: Progressing   

## 2020-07-03 NOTE — Progress Notes (Addendum)
PROGRESS NOTE    Amy [name]  EVO:[id number] DOB: [birth year] DOA: 06/19/2020 PCP: [name], MD   Chief Complaint  Patient presents with  . Code Stroke  Brief Narrative:  70 y.o. female with medical history significant ofESRD on PD, HTN, multiple thyroid nodules. She had a hip fracture in December s/p ORIF. Recently hospitalized 1/25/-05/29/20 for watery diarrhea, hyponatremia, hypokalemia. She was found to be covid 19 positive but was not treated with no signs of respiratory involvement. She did test positive for C. Diff by PCR and was started on oral Vancomycin with a 9 day Rx at discharge which she was unable to get filled.She also developed sinus tachycardia with NSTEMI for which she was seen by cardiology. She is s/p heart cath and evaluated by CVTS for CABG. Deemed not a candidate so went cath lab 2/25 for stent.  Unable to go to SNF due to PD.  Await CIR bed.  Subjective: Seen and examined this morning.  She is resting comfortably in the bed. She has no complaints, she is awaiting for CIR placement.   Assessment & Plan:  Seizure with postictal state in the setting of severe electrolyte abnormalities including hyponatremia hypomagnesemia hypokalemia due to C. difficile diarrhea: Patient extensive work-up including negative MRI, EEG with postictal state, seen by neurology.  Electrolytes have improved.  Per neurology continue Keppra and outpatient neurology follow-up and discontinued Wellbutrin  C. difficile infection on admission treated with Dificid -completed 2/27, failed 10 days course of vancomycin ,?    ESRD on PD continue outpatient urology follow-up.  Continue PD per nephrology  Anemia chronic kidney disease and also from chronic disease no active signs of bleeding received 2 units PRBC (2/20 and 2/27) Recent Labs  Lab 06/28/20 0121 06/29/20 0111 06/29/20 1042 06/30/20 0046 07/02/20 0052  HGB 7.5* 6.7* 11.0* 9.8* 9.3*  HCT 22.3* 19.1* 33.1* 29.4* 27.3*    Multivessel CAD/NSTEMI-negative CT chest and ultrasound for DVT Echo with EF 25% with typical wall motion regularities underwent cardiac cath showed-severe multivessel CAD with 80%, 90% 80% proximal LAD stenosis, 90% diagonal stenosis and 85% focal ramus intermedius stenosis.  Seen by CVTS not a candidate for CABG, unable to get cardiac MRI done cath on 2/25 with stent placement continue aspirin/Plavix for at least 1 year.  Continue torsemide, Toprol, Lipitor  Chronic systolic CHF EF 25 to 29%/HBZJIRCV cardiomyopathy: fluid removal per PD.  Continue torsemide, Toprol  Anxiety disorder.  Stable  Protein-calorie malnutrition, severe-as below  Diet Order            DIET DYS 3 Room service appropriate? Yes; Fluid consistency: Thin  Diet effective now                 Nutrition Problem: Severe Malnutrition Etiology: acute illness (C. difficile, recent COVID-19 infection) Signs/Symptoms: moderate fat depletion,severe muscle depletion Interventions: MVI,Nepro shake Patient's Body mass index is 19.19 kg/m.  Pressure Injury 06/20/20 Coccyx Medial Stage 1 -  Intact skin with non-blanchable redness of a localized area usually over a bony prominence. (Active)  06/20/20 1500  Location: Coccyx  Location Orientation: Medial  Staging: Stage 1 -  Intact skin with non-blanchable redness of a localized area usually over a bony prominence.  Wound Description (Comments):   Present on Admission: Yes    DVT prophylaxis: SCD's Start: 06/25/20 1102 Code Status:   Code Status: Full Code  Family Communication: plan of care discussed with patient at bedside.  Status is: Inpatient Remains inpatient appropriate because:Unsafe d/c  plan and Inpatient level of care appropriate due to severity of illness  Dispo: The patient is from: Home              Anticipated d/c is to: CIR              Patient currently is medically stable to d/c.   Difficult to place patient No   Unresulted Labs (From admission,  onward)          Start     Ordered   06/22/20 0616  Body fluid culture  Once,   R       Comments: On daytime exchange   Question:  Are there also cytology or pathology orders on this specimen?  Answer:  No   06/22/20 0616   Unscheduled  Occult blood card to lab, stool  As needed,   R      06/29/20 0228          Medications reviewed:  Scheduled Meds: . sodium chloride   Intravenous Once  . acetaminophen  650 mg Oral Once  . aspirin  81 mg Oral Daily  . atorvastatin  80 mg Oral q1800  . Chlorhexidine Gluconate Cloth  6 each Topical Daily  . cinacalcet  30 mg Oral Q breakfast  . clopidogrel  75 mg Oral Daily  . darbepoetin (ARANESP) injection - NON-DIALYSIS  100 mcg Subcutaneous Q Sat-1800  . feeding supplement  237 mL Oral TID BM  . gentamicin cream  1 application Topical Daily  . levETIRAcetam  500 mg Oral BID  . metoprolol succinate  25 mg Oral Daily  . multivitamin  1 tablet Oral QHS  . pantoprazole  40 mg Oral Daily  . potassium chloride  10 mEq Oral Daily  . sodium chloride flush  3 mL Intravenous Once  . sodium chloride flush  3 mL Intravenous Q12H  . sodium chloride flush  3 mL Intravenous Q12H  . torsemide  100 mg Oral BID   Continuous Infusions: . sodium chloride      Consultants:see note  Procedures:see note  Antimicrobials: Anti-infectives (From admission, onward)   Start     Dose/Rate Route Frequency Ordered Stop   06/29/20 2200  rifaximin (XIFAXAN) tablet 550 mg        550 mg Oral 2 times daily 06/29/20 1638 06/30/20 1108   06/19/20 2200  fidaxomicin (DIFICID) tablet 200 mg  Status:  Discontinued        200 mg Oral 2 times daily 06/19/20 2005 06/29/20 1210     Culture/Microbiology    Component Value Date/Time   SDES BLOOD BLOOD RIGHT HAND 05/29/2020 2043   SPECREQUEST  05/29/2020 2043    BOTTLES DRAWN AEROBIC AND ANAEROBIC Blood Culture adequate volume   CULT  05/29/2020 2043    NO GROWTH 5 DAYS Performed at [facility], [address] [place], [place] [address]    REPTSTATUS 06/03/2020 FINAL 05/29/2020 2043    Other culture-see note  Objective: Vitals: Today's Vitals   07/02/20 2234 07/03/20 0518 07/03/20 0903 07/03/20 1000  BP:  119/76 134/80   Pulse:  90 95   Resp:  18 18   Temp:  98.3 F (36.8 C) 98.2 F (36.8 C)   TempSrc:      SpO2:  95% 98%   Weight:      Height:      PainSc: 5    8     Intake/Output Summary (Last 24 hours) at 07/03/2020 1414 Last data  filed at 07/03/2020 0836 Gross per 24 hour  Intake 240 ml  Output 0 ml  Net 240 ml   Filed Weights   07/01/20 1555 07/02/20 0454 07/02/20 0650  Weight: 54.7 kg 51.4 kg 50.7 kg   Weight change:   Intake/Output from previous day: 03/02 0701 - 03/03 0700 In: 873 [P.O.:870; I.V.:3] Out: 100 [Urine:100] Intake/Output this shift: Total I/O In: 240 [P.O.:240] Out: -  Filed Weights   07/01/20 1555 07/02/20 0454 07/02/20 0650  Weight: 54.7 kg 51.4 kg 50.7 kg    Examination:  General exam: AAOx3 ,NAD, weak appearing. HEENT:Oral mucosa moist, Ear/Nose WNL grossly,dentition normal. Respiratory system: bilaterally diminished,no use of accessory muscle, non tender. Cardiovascular system: S1 & S2 +, regular, No JVD. Gastrointestinal system: Abdomen soft, NT,ND, BS+. Nervous System:Alert, awake, moving extremities and grossly nonfocal Extremities: No edema, distal peripheral pulses palpable.  Skin: No rashes,no icterus. MSK: Normal muscle bulk,tone, power Bruise on the right upper extremity at site of cardiac cath.  Data Reviewed: I have personally reviewed following labs and imaging studies CBC: Recent Labs  Lab 06/27/20 0026 06/27/20 1949 06/28/20 0121 06/29/20 0111 06/29/20 1042 06/30/20 0046 07/02/20 0052  WBC 8.7 9.8 8.8 7.3  --  10.4 8.0  NEUTROABS 6.3  --   --   --   --   --   --   HGB 8.1* 7.5* 7.5* 6.7* 11.0* 9.8* 9.3*  HCT 24.0* 22.8* 22.3* 19.1* 33.1* 29.4* 27.3*  MCV 88.9 91.2 90.3 89.7  --  89.9 91.3  PLT 246 236 238  238  --  227 342   Basic Metabolic Panel: Recent Labs  Lab 06/27/20 0026 06/28/20 0121 06/29/20 0111 06/30/20 0046 07/02/20 0052 07/03/20 0301  NA 130* 130* 131* 131* 132* 131*  K 3.5 4.0 3.7 4.0 3.4* 4.0  CL 96* 98 97* 99 99 98  CO2 23 22 23  21* 18* 21*  GLUCOSE 104* 108* 100* 114* 90 100*  BUN 29* 32* 29* 30* 28* 27*  CREATININE 4.19* 4.63* 4.65* 4.75* 4.68* 4.71*  CALCIUM 8.4* 8.2* 8.3* 8.2* 8.7* 8.9  MG 1.8  --   --  1.6* 2.0  --   PHOS 3.1  --  3.9 3.4  --  3.0   GFR: Estimated Creatinine Clearance: 8.8 mL/min (A) (by C-G formula based on SCr of 4.71 mg/dL (H)). Liver Function Tests: Recent Labs  Lab 06/27/20 0026 06/29/20 0111 06/30/20 0046 07/03/20 0301  AST 19  --   --   --   ALT 21  --   --   --   ALKPHOS 77  --   --   --   BILITOT 0.3  --   --   --   PROT 3.6*  --   --   --   ALBUMIN 1.6* 1.4* 1.4* 1.5*   No results for input(s): LIPASE, AMYLASE in the last 168 hours. Recent Labs  Lab 06/29/20 1042  AMMONIA 74*   Coagulation Profile: No results for input(s): INR, PROTIME in the last 168 hours. Cardiac Enzymes: No results for input(s): CKTOTAL, CKMB, CKMBINDEX, TROPONINI in the last 168 hours. BNP (last 3 results) No results for input(s): PROBNP in the last 8760 hours. HbA1C: No results for input(s): HGBA1C in the last 72 hours. CBG: No results for input(s): GLUCAP in the last 168 hours. Lipid Profile: No results for input(s): CHOL, HDL, LDLCALC, TRIG, CHOLHDL, LDLDIRECT in the last 72 hours. Thyroid Function Tests: No results for input(s): TSH, T4TOTAL, FREET4,  T3FREE, THYROIDAB in the last 72 hours. Anemia Panel: No results for input(s): VITAMINB12, FOLATE, FERRITIN, TIBC, IRON, RETICCTPCT in the last 72 hours. Sepsis Labs: No results for input(s): PROCALCITON, LATICACIDVEN in the last 168 hours.  No results found for this or any previous visit (from the past 240 hour(s)).   Radiology Studies: No results found.   LOS: 14 days   Antonieta Pert, MD [facility]  07/03/2020, 2:14 PM

## 2020-07-04 ENCOUNTER — Other Ambulatory Visit: Payer: Self-pay

## 2020-07-04 ENCOUNTER — Encounter (HOSPITAL_COMMUNITY): Payer: Self-pay | Admitting: Physical Medicine & Rehabilitation

## 2020-07-04 ENCOUNTER — Inpatient Hospital Stay (HOSPITAL_COMMUNITY)
Admission: RE | Admit: 2020-07-04 | Discharge: 2020-07-24 | DRG: 945 | Disposition: A | Discharge status: Home Health | Payer: Medicare Other | Source: Intra-hospital | Attending: Physical Medicine & Rehabilitation | Admitting: Physical Medicine & Rehabilitation

## 2020-07-04 DIAGNOSIS — Z955 Presence of coronary angioplasty implant and graft: Secondary | ICD-10-CM

## 2020-07-04 DIAGNOSIS — Z880 Allergy status to penicillin: Secondary | ICD-10-CM

## 2020-07-04 DIAGNOSIS — D638 Anemia in other chronic diseases classified elsewhere: Secondary | ICD-10-CM

## 2020-07-04 DIAGNOSIS — I132 Hypertensive heart and chronic kidney disease with heart failure and with stage 5 chronic kidney disease, or end stage renal disease: Secondary | ICD-10-CM | POA: Diagnosis present

## 2020-07-04 DIAGNOSIS — I5022 Chronic systolic (congestive) heart failure: Secondary | ICD-10-CM

## 2020-07-04 DIAGNOSIS — R110 Nausea: Secondary | ICD-10-CM | POA: Diagnosis not present

## 2020-07-04 DIAGNOSIS — G4700 Insomnia, unspecified: Secondary | ICD-10-CM | POA: Diagnosis present

## 2020-07-04 DIAGNOSIS — E871 Hypo-osmolality and hyponatremia: Secondary | ICD-10-CM | POA: Diagnosis present

## 2020-07-04 DIAGNOSIS — R569 Unspecified convulsions: Secondary | ICD-10-CM | POA: Diagnosis present

## 2020-07-04 DIAGNOSIS — R509 Fever, unspecified: Secondary | ICD-10-CM | POA: Diagnosis not present

## 2020-07-04 DIAGNOSIS — Z86010 Personal history of colonic polyps: Secondary | ICD-10-CM | POA: Diagnosis not present

## 2020-07-04 DIAGNOSIS — E8809 Other disorders of plasma-protein metabolism, not elsewhere classified: Secondary | ICD-10-CM | POA: Diagnosis present

## 2020-07-04 DIAGNOSIS — Z7982 Long term (current) use of aspirin: Secondary | ICD-10-CM

## 2020-07-04 DIAGNOSIS — I951 Orthostatic hypotension: Secondary | ICD-10-CM | POA: Diagnosis not present

## 2020-07-04 DIAGNOSIS — Z9049 Acquired absence of other specified parts of digestive tract: Secondary | ICD-10-CM

## 2020-07-04 DIAGNOSIS — Z7902 Long term (current) use of antithrombotics/antiplatelets: Secondary | ICD-10-CM | POA: Diagnosis not present

## 2020-07-04 DIAGNOSIS — D631 Anemia in chronic kidney disease: Secondary | ICD-10-CM | POA: Diagnosis present

## 2020-07-04 DIAGNOSIS — G894 Chronic pain syndrome: Secondary | ICD-10-CM | POA: Diagnosis present

## 2020-07-04 DIAGNOSIS — Z8249 Family history of ischemic heart disease and other diseases of the circulatory system: Secondary | ICD-10-CM | POA: Diagnosis not present

## 2020-07-04 DIAGNOSIS — Z452 Encounter for adjustment and management of vascular access device: Secondary | ICD-10-CM | POA: Diagnosis not present

## 2020-07-04 DIAGNOSIS — I4892 Unspecified atrial flutter: Secondary | ICD-10-CM | POA: Diagnosis present

## 2020-07-04 DIAGNOSIS — G40909 Epilepsy, unspecified, not intractable, without status epilepticus: Secondary | ICD-10-CM

## 2020-07-04 DIAGNOSIS — N186 End stage renal disease: Secondary | ICD-10-CM | POA: Diagnosis present

## 2020-07-04 DIAGNOSIS — Z800 Family history of malignant neoplasm of digestive organs: Secondary | ICD-10-CM

## 2020-07-04 DIAGNOSIS — Z681 Body mass index (BMI) 19 or less, adult: Secondary | ICD-10-CM | POA: Diagnosis not present

## 2020-07-04 DIAGNOSIS — Z79899 Other long term (current) drug therapy: Secondary | ICD-10-CM

## 2020-07-04 DIAGNOSIS — Z90710 Acquired absence of both cervix and uterus: Secondary | ICD-10-CM | POA: Diagnosis not present

## 2020-07-04 DIAGNOSIS — E43 Unspecified severe protein-calorie malnutrition: Secondary | ICD-10-CM | POA: Diagnosis present

## 2020-07-04 DIAGNOSIS — N2581 Secondary hyperparathyroidism of renal origin: Secondary | ICD-10-CM | POA: Diagnosis present

## 2020-07-04 DIAGNOSIS — R9431 Abnormal electrocardiogram [ECG] [EKG]: Secondary | ICD-10-CM | POA: Diagnosis not present

## 2020-07-04 DIAGNOSIS — E46 Unspecified protein-calorie malnutrition: Secondary | ICD-10-CM

## 2020-07-04 DIAGNOSIS — I5042 Chronic combined systolic (congestive) and diastolic (congestive) heart failure: Secondary | ICD-10-CM | POA: Diagnosis present

## 2020-07-04 DIAGNOSIS — I21A1 Myocardial infarction type 2: Secondary | ICD-10-CM | POA: Diagnosis present

## 2020-07-04 DIAGNOSIS — Z888 Allergy status to other drugs, medicaments and biological substances status: Secondary | ICD-10-CM

## 2020-07-04 DIAGNOSIS — I2510 Atherosclerotic heart disease of native coronary artery without angina pectoris: Secondary | ICD-10-CM | POA: Diagnosis present

## 2020-07-04 DIAGNOSIS — Z8619 Personal history of other infectious and parasitic diseases: Secondary | ICD-10-CM | POA: Diagnosis not present

## 2020-07-04 DIAGNOSIS — K3184 Gastroparesis: Secondary | ICD-10-CM | POA: Diagnosis present

## 2020-07-04 DIAGNOSIS — R112 Nausea with vomiting, unspecified: Secondary | ICD-10-CM | POA: Diagnosis not present

## 2020-07-04 DIAGNOSIS — M47816 Spondylosis without myelopathy or radiculopathy, lumbar region: Secondary | ICD-10-CM | POA: Diagnosis not present

## 2020-07-04 DIAGNOSIS — G43909 Migraine, unspecified, not intractable, without status migrainosus: Secondary | ICD-10-CM | POA: Diagnosis present

## 2020-07-04 DIAGNOSIS — E876 Hypokalemia: Secondary | ICD-10-CM | POA: Diagnosis present

## 2020-07-04 DIAGNOSIS — R531 Weakness: Secondary | ICD-10-CM | POA: Diagnosis not present

## 2020-07-04 DIAGNOSIS — Z8616 Personal history of COVID-19: Secondary | ICD-10-CM | POA: Diagnosis not present

## 2020-07-04 DIAGNOSIS — A0472 Enterocolitis due to Clostridium difficile, not specified as recurrent: Secondary | ICD-10-CM | POA: Diagnosis not present

## 2020-07-04 DIAGNOSIS — M4186 Other forms of scoliosis, lumbar region: Secondary | ICD-10-CM | POA: Diagnosis not present

## 2020-07-04 DIAGNOSIS — I120 Hypertensive chronic kidney disease with stage 5 chronic kidney disease or end stage renal disease: Secondary | ICD-10-CM | POA: Diagnosis not present

## 2020-07-04 DIAGNOSIS — Z992 Dependence on renal dialysis: Secondary | ICD-10-CM

## 2020-07-04 DIAGNOSIS — R5381 Other malaise: Secondary | ICD-10-CM | POA: Diagnosis present

## 2020-07-04 MED ORDER — BISACODYL 10 MG RE SUPP: 10.0000 mg | Freq: Every day | RECTAL | Status: DC | PRN

## 2020-07-04 MED ORDER — BOOST / RESOURCE BREEZE PO LIQD CUSTOM
1.0000 | Freq: Three times a day (TID) | ORAL | Status: DC
  Administered 2020-07-04 – 2020-07-23 (×18): 1 via ORAL

## 2020-07-04 MED ORDER — RENA-VITE PO TABS
1.0000 | ORAL_TABLET | Freq: Every day | ORAL | Status: DC
  Administered 2020-07-04 – 2020-07-20 (×17): 1 via ORAL
  Filled 2020-07-04 (×18): qty 1

## 2020-07-04 MED ORDER — GERHARDT'S BUTT CREAM: TOPICAL_CREAM | CUTANEOUS | Status: DC | PRN

## 2020-07-04 MED ORDER — DARBEPOETIN ALFA 100 MCG/0.5ML IJ SOSY
100.0000 ug | PREFILLED_SYRINGE | INTRAMUSCULAR | Status: DC
  Administered 2020-07-05: 100 ug via SUBCUTANEOUS
  Filled 2020-07-04: qty 0.5

## 2020-07-04 MED ORDER — BOOST / RESOURCE BREEZE PO LIQD CUSTOM
1.0000 | Freq: Three times a day (TID) | ORAL | Status: DC
  Administered 2020-07-04: 1 via ORAL

## 2020-07-04 MED ORDER — POTASSIUM CHLORIDE CRYS ER 10 MEQ PO TBCR
10.0000 meq | EXTENDED_RELEASE_TABLET | Freq: Every day | ORAL | Status: DC
  Administered 2020-07-05 – 2020-07-12 (×8): 10 meq via ORAL
  Filled 2020-07-04 (×8): qty 1

## 2020-07-04 MED ORDER — CHLORHEXIDINE GLUCONATE CLOTH 2 % EX PADS
6.0000 | MEDICATED_PAD | Freq: Every day | CUTANEOUS | Status: DC
  Administered 2020-07-05 – 2020-07-24 (×16): 6 via TOPICAL

## 2020-07-04 MED ORDER — CINACALCET HCL 30 MG PO TABS
30.0000 mg | ORAL_TABLET | Freq: Every day | ORAL | Status: DC
  Administered 2020-07-05 – 2020-07-22 (×18): 30 mg via ORAL
  Filled 2020-07-04 (×18): qty 1

## 2020-07-04 MED ORDER — ATORVASTATIN CALCIUM 80 MG PO TABS
80.0000 mg | ORAL_TABLET | Freq: Every day | ORAL | Status: DC
  Administered 2020-07-05 – 2020-07-23 (×20): 80 mg via ORAL
  Filled 2020-07-04 (×19): qty 1

## 2020-07-04 MED ORDER — BUTALBITAL-APAP-CAFFEINE 50-325-40 MG PO TABS
1.0000 | ORAL_TABLET | Freq: Four times a day (QID) | ORAL | Status: DC | PRN
  Administered 2020-07-04: 2 via ORAL
  Administered 2020-07-05: 1 via ORAL
  Administered 2020-07-05 – 2020-07-07 (×5): 2 via ORAL
  Administered 2020-07-07 (×2): 1 via ORAL
  Administered 2020-07-08 (×2): 2 via ORAL
  Administered 2020-07-09: 1 via ORAL
  Administered 2020-07-09 – 2020-07-10 (×3): 2 via ORAL
  Filled 2020-07-04: qty 2
  Filled 2020-07-04: qty 1
  Filled 2020-07-04 (×5): qty 2
  Filled 2020-07-04: qty 1
  Filled 2020-07-04: qty 2
  Filled 2020-07-04 (×2): qty 1
  Filled 2020-07-04 (×3): qty 2
  Filled 2020-07-04: qty 1
  Filled 2020-07-04: qty 2
  Filled 2020-07-04: qty 1

## 2020-07-04 MED ORDER — GUAIFENESIN-DM 100-10 MG/5ML PO SYRP: 5.0000 mL | ORAL_SOLUTION | Freq: Four times a day (QID) | ORAL | Status: DC | PRN

## 2020-07-04 MED ORDER — HEPARIN 1000 UNIT/ML FOR PERITONEAL DIALYSIS
INTRAPERITONEAL | Status: DC | PRN
  Filled 2020-07-04: qty 5000

## 2020-07-04 MED ORDER — LEVETIRACETAM 500 MG PO TABS
500.0000 mg | ORAL_TABLET | Freq: Two times a day (BID) | ORAL | Status: DC
  Administered 2020-07-04 – 2020-07-24 (×40): 500 mg via ORAL
  Filled 2020-07-04 (×16): qty 1
  Filled 2020-07-04: qty 2
  Filled 2020-07-04 (×4): qty 1
  Filled 2020-07-04: qty 2
  Filled 2020-07-04 (×18): qty 1

## 2020-07-04 MED ORDER — ONDANSETRON HCL 4 MG PO TABS
4.0000 mg | ORAL_TABLET | Freq: Four times a day (QID) | ORAL | Status: DC | PRN
Start: 2020-07-04 — End: 2020-07-11
  Administered 2020-07-06 – 2020-07-10 (×4): 4 mg via ORAL
  Filled 2020-07-04 (×4): qty 1

## 2020-07-04 MED ORDER — MILK AND MOLASSES ENEMA
1.0000 | Freq: Every day | RECTAL | Status: DC | PRN
  Filled 2020-07-04 (×2): qty 240

## 2020-07-04 MED ORDER — TRAZODONE HCL 50 MG PO TABS
25.0000 mg | ORAL_TABLET | Freq: Every evening | ORAL | Status: DC | PRN
  Administered 2020-07-04 – 2020-07-05 (×2): 25 mg via ORAL
  Filled 2020-07-04 (×2): qty 1

## 2020-07-04 MED ORDER — SODIUM CHLORIDE 0.9% FLUSH
3.0000 mL | Freq: Two times a day (BID) | INTRAVENOUS | Status: DC
  Administered 2020-07-04 – 2020-07-12 (×15): 3 mL via INTRAVENOUS

## 2020-07-04 MED ORDER — ACETAMINOPHEN 325 MG PO TABS
325.0000 mg | ORAL_TABLET | ORAL | Status: DC | PRN
  Administered 2020-07-06 – 2020-07-17 (×3): 650 mg via ORAL
  Administered 2020-07-19: 325 mg via ORAL
  Administered 2020-07-22 – 2020-07-23 (×2): 650 mg via ORAL
  Filled 2020-07-04 (×12): qty 2

## 2020-07-04 MED ORDER — POTASSIUM CHLORIDE CRYS ER 10 MEQ PO TBCR: 10.0000 meq | EXTENDED_RELEASE_TABLET | Freq: Every day | ORAL | Status: DC

## 2020-07-04 MED ORDER — CLOPIDOGREL BISULFATE 75 MG PO TABS
75.0000 mg | ORAL_TABLET | Freq: Every day | ORAL | Status: DC
  Administered 2020-07-05 – 2020-07-24 (×20): 75 mg via ORAL
  Filled 2020-07-04 (×20): qty 1

## 2020-07-04 MED ORDER — ONDANSETRON HCL 4 MG/2ML IJ SOLN
4.0000 mg | Freq: Four times a day (QID) | INTRAMUSCULAR | Status: DC | PRN
  Administered 2020-07-07 – 2020-07-09 (×6): 4 mg via INTRAVENOUS
  Filled 2020-07-04 (×6): qty 2

## 2020-07-04 MED ORDER — METOPROLOL SUCCINATE ER 25 MG PO TB24
25.0000 mg | ORAL_TABLET | Freq: Every day | ORAL | Status: DC
  Administered 2020-07-05 – 2020-07-24 (×19): 25 mg via ORAL
  Filled 2020-07-04 (×20): qty 1

## 2020-07-04 MED ORDER — TORSEMIDE 20 MG PO TABS
100.0000 mg | ORAL_TABLET | Freq: Two times a day (BID) | ORAL | Status: DC
  Administered 2020-07-05 – 2020-07-22 (×35): 100 mg via ORAL
  Filled 2020-07-04 (×36): qty 5

## 2020-07-04 MED ORDER — ALUMINUM HYDROXIDE GEL 320 MG/5ML PO SUSP
10.0000 mL | Freq: Four times a day (QID) | ORAL | Status: DC | PRN
  Administered 2020-07-17 – 2020-07-19 (×4): 10 mL via ORAL
  Filled 2020-07-04 (×7): qty 30

## 2020-07-04 MED ORDER — POLYETHYLENE GLYCOL 3350 17 G PO PACK: 17.0000 g | PACK | Freq: Every day | ORAL | Status: DC | PRN

## 2020-07-04 MED ORDER — ASPIRIN 81 MG PO CHEW
81.0000 mg | CHEWABLE_TABLET | Freq: Every day | ORAL | Status: DC
  Administered 2020-07-05 – 2020-07-24 (×20): 81 mg via ORAL
  Filled 2020-07-04 (×20): qty 1

## 2020-07-04 MED ORDER — PANTOPRAZOLE SODIUM 40 MG PO TBEC
40.0000 mg | DELAYED_RELEASE_TABLET | Freq: Every day | ORAL | Status: DC
  Administered 2020-07-05 – 2020-07-24 (×20): 40 mg via ORAL
  Filled 2020-07-04 (×20): qty 1

## 2020-07-04 MED ORDER — GENTAMICIN SULFATE 0.1 % EX CREA
1.0000 | TOPICAL_CREAM | Freq: Every day | CUTANEOUS | Status: DC
Start: 2020-07-05 — End: 2020-07-19
  Administered 2020-07-05 – 2020-07-18 (×19): 1 via TOPICAL
  Filled 2020-07-04: qty 15

## 2020-07-04 MED ORDER — DIPHENHYDRAMINE HCL 12.5 MG/5ML PO ELIX: 12.5000 mg | ORAL_SOLUTION | Freq: Four times a day (QID) | ORAL | Status: DC | PRN

## 2020-07-04 NOTE — Progress Notes (Signed)
Nutrition Follow-up  DOCUMENTATION CODES:   Severe malnutrition in context of acute illness/injury  INTERVENTION:   Boost Breeze po TID, each supplement provides 250 kcal and 9 grams of protein  Magic cup TID with meals, each supplement provides 290 kcal and 9 grams of protein  D/c Ensure; pt refusing  Continue Dysphagia 3 diet  Continue chocolate milk TID with meals  Continue Renal MVI daily   NUTRITION DIAGNOSIS:   Severe Malnutrition related to acute illness (C. difficile, recent COVID-19 infection) as evidenced by moderate fat depletion,severe muscle depletion.  ongoing  GOAL:   Patient will meet greater than or equal to 90% of their needs  progressing  MONITOR:   PO intake,Supplement acceptance,Labs,Weight trends,Skin,I & O's  REASON FOR ASSESSMENT:   Malnutrition Screening Tool    ASSESSMENT:   70 year old female who presented to the ED on 2/17 as a Code Stroke. PMH of ESRD on PD nightly (previously on HD), HTN, anxiety, depression, multiple thyroid nodules, right hip fracture secondary to mechanical fall in December 2021, and recent hospital admission for gastroenteritis (positive for C. diff) due to COVID-19 infection. Pt admitted with hyponatremia, hypokalemia, diarrhea, seizures.  Pt with new heart failure 2/2 ICM  2/23 - s/p heart cath showing severe multivessel CAD 2/25 - s/p PCI to LAD with DES  Per MD, pt is medically stable for CIR and is pending d/c (likely today).   Pt's diet was downgraded to dysphagia 3 with removal of renal restrictions after last RD assessment. Pt's intake was slow to improve, but appears to be significantly improved over the last day or so. Last 8 meal completions charted as 20-75% (49% average meal intake) -- last 3 meals were all 75% meal completion.   Pt with orders for Ensure TID, but is primarily refusing per RN. Pt previously refusing Nepro. Will trial [employer] and [employer].   No UOP documented.   Admit wt:  51.2 kg Current wt: 50.7 kg Wt loss since admit likely primarily due to fluid shifts; edema noted to be improving  Medications: sensipar, aranesp, rena-vit, protonix, klor-con Labs (none from today, last updated 3/3): Na 131 (L), Cr 4.71 (H, higher than previous day)  Diet Order:   Diet Order            DIET DYS 3 Room service appropriate? Yes; Fluid consistency: Thin  Diet effective now                 EDUCATION NEEDS:   Education needs have been addressed  Skin:  Skin Assessment: Skin Integrity Issues: Skin Integrity Issues:: Stage I Stage I: coccyx  Last BM:  3/3 type 6  Height:   Ht Readings from Last 1 Encounters:  06/26/20 5\' 4"  (1.626 m)    Weight:   Wt Readings from Last 1 Encounters:  07/02/20 50.7 kg    BMI:  Body mass index is 19.19 kg/m.  Estimated Nutritional Needs:   Kcal:  1650-1850  Protein:  85-100 grams  Fluid:  1000 ml + UOP    Larkin Ina, MS, RD, LDN RD pager number and weekend/on-call pager number located in [place].

## 2020-07-04 NOTE — Progress Notes (Signed)
Patient arrived on unit at 18:15 in good spirits VSS. Patient vital signs taken and patient oriented to unit policies and procedures. RW x1 transfer hx of ESRD, HTN  Thyroid nodules and Allergies to many antibiotics. Patient pleasant and denies any abuse or neglect at home.

## 2020-07-04 NOTE — H&P (Signed)
Physical Medicine and Rehabilitation Admission H&P    Chief Complaint  Patient presents with  . Debility     HPI: Amy [name] is a 70 year old female with history of ESRD- on PD, HTN, gastritis, A flutter, anxiety d/o, R-hip fracture s/p ORIF 04/2020, recent admission for diarrhea (found to be Covid + also) due to C diff colitis -> d/c to home on oral Vanc but unable to get meds who was readmitted on 06/19/20 with mental status changes due to brief seizure from multiple metabolic derangements from volume depletion. EEG showed nonspecific cortical dysfunction in bifrontal region with moderate diffuse encephalopathy. She was loaded with keppra and found to have elevated cardiac enzymes due to NSTEMI due to demand ischemia. Dr. Jory Sims recommends keppra 500 mg bid for new onset seizure with neuro follow up for adjustment or wean-->seizure provoked by multiple metabolic derangements and no driving X 6 months.   2D echo showed Takotsubo cardiomyopathy and combined systolic/diastolic. CHF being managed with PD and sinus tach managed with addition of BB.  CTA chest negative for PE but showed coronary calcification. She underwent cardiac cath revealing severe multivessel disease and underwent PCI 02/25 as not felt to be CABG candidate. She developed RUE hematoma post procedure which was treated with elevation/compression and acute on chronic anemia transfused with one unit PRBC. Dr. Felizardo Hoffmann team following for input on meds which have been optimized and limited by BP. She continues to be limited by fatigue and weakness with decreased WB LLE, delayed processing with memory deficits affecting activity. Due to PD unable to go to SNF but is showing improvement in activity tolerance and CIR recommended due to functional decline.     Review of Systems  Constitutional: Negative for chills and fever.  HENT: Negative for hearing loss and tinnitus.   Eyes: Negative for blurred vision.  Respiratory: Negative  for cough and hemoptysis.   Cardiovascular: Negative for chest pain and palpitations.  Gastrointestinal: Negative for nausea and vomiting.  Genitourinary: Negative for dysuria.  Musculoskeletal: Negative for back pain, myalgias and neck pain.  Skin: Negative for itching.  Neurological: Positive for weakness and headaches.  Endo/Heme/Allergies: Bruises/bleeds easily.  Psychiatric/Behavioral: Negative for depression and suicidal ideas.     Past Medical History:  Diagnosis Date  . Anemia    a. 08/2016 Guaiac + stool. EGD w/ gastritis.  . Anxiety   . CKD (chronic kidney disease), stage III ([place])   . Closed fracture of shaft of left ulna 11/27/2018  . Colon polyp    a. 08/2016 Colonoscopy.  . Gastritis    a. 08/2016 EGD: Gastritis-->PPI.  [name] GERD (gastroesophageal reflux disease)   . History of chicken pox   . History of measles as a child   . Hypertension   . Hyponatremia    a. 08/2016 in setting of HCTZ Rx.  . Mesenteric lymphadenopathy 04/08/2017  . Multiple thyroid nodules     Past Surgical History:  Procedure Laterality Date  . ABDOMINAL HYSTERECTOMY  2003   due to heavy periods and clotting during menses  . BREAST LUMPECTOMY Left 1990  . CHOLECYSTECTOMY  2010  . COLONOSCOPY WITH PROPOFOL N/A 09/07/2016   Procedure: COLONOSCOPY WITH PROPOFOL;  Surgeon: Lucilla Lame, MD;  Location: [facility] END[facility];  Service: Endoscopy;  Laterality: N/A;  . CORONARY STENT INTERVENTION N/A 06/27/2020   Procedure: CORONARY STENT INTERVENTION;  Surgeon: Sherren Mocha, MD;  Location: [place] [facility];  Service: Cardiovascular;  Laterality: N/A;  . DIALYSIS/PERMA CATHETER  INSERTION N/A 11/16/2018   Procedure: DIALYSIS/PERMA CATHETER INSERTION;  Surgeon: Algernon Huxley, MD;  Location: [place] [facility];  Service: Cardiovascular;  Laterality: N/A;  . DIALYSIS/PERMA CATHETER REMOVAL N/A 08/16/2019   Procedure: DIALYSIS/PERMA CATHETER REMOVAL;  Surgeon: Algernon Huxley, MD;  Location: [place] [facility];   Service: Cardiovascular;  Laterality: N/A;  . DILATION AND CURETTAGE OF UTERUS  1985  . ESOPHAGOGASTRODUODENOSCOPY (EGD) WITH PROPOFOL N/A 09/07/2016   Procedure: ESOPHAGOGASTRODUODENOSCOPY (EGD) WITH PROPOFOL;  Surgeon: Lucilla Lame, MD;  Location: [facility] ENDOSCOPY;  Service: Endoscopy;  Laterality: N/A;  . GALLBLADDER SURGERY  2012  . INTRAMEDULLARY (IM) NAIL INTERTROCHANTERIC Right 04/23/2020   Procedure: INTRAMEDULLARY (IM) NAIL INTERTROCHANTRIC;  Surgeon: Thornton Park, MD;  Location: [facility] ORS;  Service: Orthopedics;  Laterality: Right;  . INTRAVASCULAR IMAGING/OCT N/A 06/27/2020   Procedure: INTRAVASCULAR IMAGING/OCT;  Surgeon: Sherren Mocha, MD;  Location: [place] [facility];  Service: Cardiovascular;  Laterality: N/A;  . KNEE SURGERY     Dr. Tamala Julian  . LEFT HEART CATH AND CORONARY ANGIOGRAPHY N/A 06/25/2020   Procedure: LEFT HEART CATH AND CORONARY ANGIOGRAPHY;  Surgeon: Troy Sine, MD;  Location: [place] [facility];  Service: Cardiovascular;  Laterality: N/A;  . TONSILLECTOMY    . TONSILLECTOMY AND ADENOIDECTOMY  1970  . UPPER GI ENDOSCOPY  03/02/2006   H. Pylori negative; Dr. Allen Norris    Family History  Problem Relation Age of Onset  . Pancreatic cancer Father   . Congestive Heart Failure Maternal Grandmother   . Cancer Paternal Grandfather   . Heart disease Maternal Uncle     Social History:  Married. 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  Husband works days in [place]. She reports that she has never smoked. She has never used smokeless tobacco. She reports current alcohol use--glass of wine occasionally. . She reports that she does not use drugs.    Allergies  Allergen Reactions  . Cephalosporins Hives and Swelling  . Clarithromycin Other (See Comments)    Unknown reaction  . Lunesta  [Eszopiclone]     Heart racing  . Penicillin V     Has patient had a PCN reaction causing immediate rash, facial/tongue/throat  swelling, SOB or lightheadedness with hypotension: Yes Has patient had a PCN reaction causing severe rash involving mucus membranes or skin necrosis: No Has patient had a PCN reaction that required hospitalization No Has patient had a PCN reaction occurring within the last 10 years: No If all of the above answers are "NO", then may proceed with Cephalosporin use.  . Sulfa Antibiotics     Unknown reaction   Medications Prior to Admission  Medication Sig Dispense Refill  . buPROPion (WELLBUTRIN) 75 MG tablet TAKE 1 TABLET(75 MG) BY MOUTH TWICE DAILY (Patient taking differently: Take 75 mg by mouth 2 (two) times daily.) 180 tablet 3  . butalbital-acetaminophen-caffeine (FIORICET) 50-325-40 MG tablet TAKE 1 TO 2 TABLETS BY MOUTH EVERY 4 HOURS AS NEEDED FOR HEADACHE (Patient taking differently: Take 2 tablets by mouth every 6 (six) hours as needed for headache.) 150 tablet 5  . calcitRIOL (ROCALTROL) 0.5 MCG capsule Take 0.5 mcg by mouth daily.    . cinacalcet (SENSIPAR) 30 MG tablet Take 30 mg by mouth daily.    . cloNIDine (CATAPRES) 0.2 MG tablet Take 1 tablet (0.2 mg total) by mouth 2 (two) times daily. (Patient taking differently: Take 0.2 mg by mouth 3 (three) times daily.) 60 tablet 0  .  esomeprazole (NEXIUM) 20 MG capsule Take 20 mg by mouth daily.    . hydrALAZINE (APRESOLINE) 50 MG tablet Take 1 tablet (50 mg total) by mouth 3 (three) times daily as needed. For SBP > 160 mm hg 90 tablet 0  . LORazepam (ATIVAN) 1 MG tablet TAKE 1 TABLET(1 MG) BY MOUTH TWICE DAILY AS NEEDED (Patient taking differently: Take 1 mg by mouth 2 (two) times daily as needed for anxiety or sleep.) 180 tablet 5  . losartan (COZAAR) 100 MG tablet Take 100 mg by mouth at bedtime.    . metoprolol tartrate (LOPRESSOR) 50 MG tablet Take 75 mg by mouth 2 (two) times daily.    . sennosides-docusate sodium (SENOKOT-S) 8.6-50 MG tablet Take 2 tablets by mouth 2 (two) times daily as needed for constipation. 30 tablet 0  .  sodium bicarbonate 650 MG tablet Take 1 tablet (650 mg total) by mouth 2 (two) times daily. 60 tablet 0  . torsemide (DEMADEX) 100 MG tablet Take 100 mg by mouth 2 (two) times daily.    . valACYclovir (VALTREX) 1000 MG tablet Take 1,000 mg by mouth 2 (two) times daily as needed (cold sores).    . zolpidem (AMBIEN) 10 MG tablet TAKE 1/2 TO 1 TABLET(5 TO 10 MG) BY MOUTH AT BEDTIME AS NEEDED (Patient taking differently: Take 10 mg by mouth at bedtime.) 30 tablet 5    Drug Regimen Review  Drug regimen was reviewed and remains appropriate with no significant issues identified  Home: Home Living Family/patient expects to be discharged to:: Private residence Living Arrangements: Spouse/significant other Available Help at Discharge: Family,Available 24 hours/day Type of Home: House Home Access: Stairs to enter [employer] of Steps: 2 Entrance Stairs-Rails: None Home Layout: Able to live on main level with bedroom/bathroom,Two level Bathroom Shower/Tub: Multimedia programmer: Standard Home Equipment: Environmental consultant - 2 wheels,Cane - single point,Bedside commode,Shower seat   Functional History: Prior Function Level of Independence: Needs assistance Gait / Transfers Assistance Needed: pt has been using RW since her hip fracture in dec ADL's / Homemaking Assistance Needed: spouse assisting with ADLs Comments: Indep without assist device for ADLs, household and community mobilization; works from home for [employer].  Denies additional fall history.  Functional Status:  Mobility: Bed Mobility Overal bed mobility: Needs Assistance Bed Mobility: Supine to Sit Rolling: Supervision Sidelying to sit: Min assist,Mod assist Supine to sit: Min assist Sit to supine: Mod assist General bed mobility comments: up in recliner on entry Transfers Overall transfer level: Needs assistance Equipment used: Rolling walker (2 wheeled) Transfers: Sit to/from [employer] Sit to Stand: Min assist,Mod assist Stand pivot transfers: Min assist General transfer comment: PT allowed pt to problem solve initial stand from recliner, pt with poor hip, feet and hand placement so requires modA, requires min A at hips to step transfer to [facility]. with maximal cuing pt able to set up for improved stand from [facility] on min physical assist required with additional 2 sit>stands Ambulation/Gait Ambulation/Gait assistance: Min assist Gait Distance (Feet): 10 Feet (10x 2 with seated rest break) Assistive device: Rolling walker (2 wheeled) Gait Pattern/deviations: Step-to pattern,Trunk flexed,Shuffle,Decreased stance time - left General Gait Details: minA at hips for safety and to improve pt confidence, pt continues with decreased L weightbearing, pt requires max encouragement, however surprised herself that she was able to accomplish Gait velocity: slowed Gait velocity interpretation: <1.31 ft/sec, indicative of household ambulator    ADL: ADL Overall ADL's : Needs assistance/impaired Eating/Feeding:  Independent,Sitting Eating/Feeding Details (indicate cue type and reason): due to edema in arms/hands Grooming: Wash/dry hands,Wash/dry face,Set up,Sitting Grooming Details (indicate cue type and reason): BSC Upper Body Bathing: Maximal assistance,Bed level Lower Body Bathing: Maximal assistance Lower Body Bathing Details (indicate cue type and reason): min A sit<>stand Upper Body Dressing : Maximal assistance,Sitting Lower Body Dressing: Total assistance Lower Body Dressing Details (indicate cue type and reason): min A sit<>stand Toilet Transfer: Moderate assistance,Stand-pivot,BSC Toilet Transfer Details (indicate cue type and reason): one person face to face transfer [place] and Hygiene: Maximal assistance,Sit to/from stand Toileting - Clothing Manipulation Details (indicate cue type and reason): Pt able to stand while OT performed gentle peri  care Functional mobility during ADLs: Minimal assistance,Rolling walker General ADL Comments: decreased activity tolerance/balance  Cognition: Cognition Overall Cognitive Status: Impaired/Different from baseline Orientation Level: Oriented X4 Cognition Arousal/Alertness: Awake/alert Behavior During Therapy: WFL for tasks assessed/performed Overall Cognitive Status: Impaired/Different from baseline Area of Impairment: Memory,Problem solving Memory: Decreased short-term memory (can't remember she has already had headache medicine 2 hours ago) Problem Solving: Difficulty sequencing,Requires verbal cues,Requires tactile cues,Slow processing General Comments: Pt is tired of hospitalization and all things associated with being "stuck" in one room. - potentially we could take her outside in [facility] next session?   Blood pressure (!) 147/87, pulse 91, temperature 98.9 F (37.2 C), temperature source Oral, resp. rate 16, height 5\' 4"  (1.626 m), weight 50.7 kg, SpO2 99 %. Physical Exam Constitutional:      General: She is not in acute distress.    Appearance: She is ill-appearing.  HENT:     Head: Normocephalic and atraumatic.     Right Ear: External ear normal.     Left Ear: External ear normal.     Nose: Nose normal. No congestion.     Mouth/Throat:     Mouth: Mucous membranes are moist.     Pharynx: Oropharynx is clear. No oropharyngeal exudate.  Eyes:     Extraocular Movements: Extraocular movements intact.     Pupils: Pupils are equal, round, and reactive to light.  Cardiovascular:     Rate and Rhythm: Normal rate and regular rhythm.     Heart sounds: No murmur heard. No gallop.   Pulmonary:     Effort: Pulmonary effort is normal. No respiratory distress.     Breath sounds: No stridor. No wheezing or rhonchi.  Abdominal:     General: There is no distension.     Palpations: Abdomen is soft.     Tenderness: There is no abdominal tenderness.     Comments: PD site clean, intact   Musculoskeletal:        General: Swelling and tenderness (RUE) present.     Cervical back: Normal range of motion and neck supple.     Right lower leg: Edema present.     Left lower leg: Edema present.  Skin:    Findings: Bruising (diffuse bruising RUE, some bruising LUE as well.) present.  Neurological:     Comments: Alert and oriented x 3. Normal insight and awareness. Intact Memory. Normal language and speech. Cranial nerve exam unremarkable. Motor 4/5 bilateral UE's. LE's 4/5 prox to distal. No focal sensory findings.   Psychiatric:        Mood and Affect: Mood normal.        Behavior: Behavior normal.     Results for orders placed or performed during the hospital encounter of 06/19/20 (from the past 48 hour(s))  Renal function panel  Status: Abnormal   Collection Time: 07/03/20  3:01 AM  Result Value Ref Range   Sodium 131 (L) 135 - 145 mmol/L   Potassium 4.0 3.5 - 5.1 mmol/L   Chloride 98 98 - 111 mmol/L   CO2 21 (L) 22 - 32 mmol/L   Glucose, Bld 100 (H) 70 - 99 mg/dL    Comment: Glucose reference range applies only to samples taken after fasting for at least 8 hours.   BUN 27 (H) 8 - 23 mg/dL   Creatinine, Ser 4.71 (H) 0.44 - 1.00 mg/dL   Calcium 8.9 8.9 - 10.3 mg/dL   Phosphorus 3.0 2.5 - 4.6 mg/dL   Albumin 1.5 (L) 3.5 - 5.0 g/dL   GFR, Estimated 9 (L) >60 mL/min    Comment: (NOTE) Calculated using the CKD-EPI Creatinine Equation (2021)    Anion gap 12 5 - 15    Comment: Performed at [facility] [address] [place], [place] [address]   No results found.     Medical Problem List and Plan: 1.  Functional and mobility deficits secondary to debility after multiple medical issues  -patient may shower  -ELOS/Goals: 7-10 days, supervision with PT, OT 2.  Antithrombotics: -DVT/anticoagulation:  Mechanical:  Antiembolism stockings, knee (TED hose) Bilateral lower extremities Sequential compression devices, below knee Bilateral lower  extremities  -antiplatelet therapy: ASA/plavix 3. Chronic daily frequent HA/Pain Management: tylenol for basic pain  -fioricet for breakthrough headaches (on this PTA) 4. Mood: fairly positive. Team to provide ego support as needed, SW eval  -antipsychotic agents: n/a  -trazodone prn for sleep 5. Neuropsych: This patient is capable of making decisions on her own behalf. 6. Skin/Wound Care: Routine pressure relief measures. 7. Fluids/Electrolytes/Nutrition:  Monitor I/O. Has low protein stores with alb-1.5  --Continue boost for supplement.   --Electrolyte abnormalities being addressed with PD.   8. ESRD: Continue daily PD  --Hyponatremia stable around 130 9. CAD s/p PCI: On ASA/Plavix, metoprolol and lipitor.  --Monitor for symptoms with increase in activity.  10. New onset seizures: On Keppra bid  --off Wellbutrin. Monitor for anxiety symptoms. 11. H/o A flutter:  Monitor HR tid--continue metoprolol. 12. Anemia of chronic disease: Monitor with serial H/H--improved post transfusion.   --On Aranesp weekly 13. C diff colitis: Completed treatment with Dificid on 02/27  -off contact precautions 14.  Chronic combined CHF: Takatsubo CM with EF--25-30%  -- Monitor weights daily.   --On demadex bid with Kdur for supplement as well as metoprolol and statin.        Bary Leriche, PA-C 07/04/2020

## 2020-07-04 NOTE — Progress Notes (Signed)
Physical Therapy Treatment Patient Details Name: Amy [name] MRN: [id number] DOB: 1950-11-11 Today's Date: 07/04/2020    History of Present Illness Amy [name] is a 70 y.o. female found to be covid-19 positive but was not treated with no signs of respiratory involvement. She did test positive for C. Diff by PCR and was started on oral Vancomycin with a 9 day Rx at discharge which she was unable to get filled. Also developed sinus tachycardia with NSTEMI for which she was seen by cardiology. She is s/p heart cath 06/25/20 and evaluated by CVTS for CABG. Deemed not a candidate so went cath lab 2/25 for stent. PMH: ESRD on PD, HTN, thyroid nodules, R hip fx in 12/21 s/p ORIF.    PT Comments    Pt received in supine, agreeable to therapy session with encouragement, c/o headache and RN notified. Primary session focus on supine/seated/standing exercise instruction for BLE strengthening, bed mobility and safety with transfers. Pt performed BLE A/AAROM therapeutic exercises with good tolerance, noted to have quad lag bilaterally with straight leg raises and weak L>R hip abduction but good participation overall with exercises as detailed below. Pt continues to need increased time/cues for bed mobility and transfers, with mostly minA to perform this date. Gait distance limited due to pt having need for BM and fatigue after supine/seated/standing exercises. Pt continues to benefit from PT services to progress toward functional mobility goals. Continue to recommend CIR.   Follow Up Recommendations  CIR     Equipment Recommendations  None recommended by PT    Recommendations for Other Services       Precautions / Restrictions Precautions Precautions: Fall Precaution Comments: weakness, peritoneal diaylisis Restrictions Weight Bearing Restrictions: No    Mobility  Bed Mobility Overal bed mobility: Needs Assistance Bed Mobility: Supine to Sit;Sit to Supine     Supine to sit: Min assist Sit to  supine: Min assist   General bed mobility comments: cues for task sequencing, heavy use of bed rail to rise, pt able to bring trunk upright with minA after therapist encouraged her to try on her own rather than pulling up on therapist arm; use of bed features/rail; needs minA for BLE mgmt to return to supine    Transfers Overall transfer level: Needs assistance Equipment used: Rolling walker (2 wheeled) Transfers: Sit to/from Stand Sit to Stand: Min assist         General transfer comment: Needs step by step cueing to set up for standing, but when teachback method used pt able to state what she needs to do when prompted; from EOB to RW, then from [facility] to RW  Ambulation/Gait Ambulation/Gait assistance: Min assist Gait Distance (Feet): 5 Feet (49ft +51ft EOB<>BSC) Assistive device: Rolling walker (2 wheeled) Gait Pattern/deviations: Step-to pattern;Trunk flexed;Shuffle;Decreased stance time - left Gait velocity: slowed   General Gait Details: cues for upright posture, RW mgmt, needs minA at times for stability within RW and somewhat unsteady, decreased B foot clearance and quick to fatigue; could benefit from chair follow for gait progression   Stairs             Wheelchair Mobility    Modified Rankin (Stroke Patients Only)       Balance Overall balance assessment: Needs assistance Sitting-balance support: Feet supported;No upper extremity supported Sitting balance-Leahy Scale: Fair     Standing balance support: Single extremity supported;Bilateral upper extremity supported Standing balance-Leahy Scale: Poor Standing balance comment: able to static stand after toileting ~1 minutes for hygiene  assist prior to walking to EOB, heavy reliance on RW and external assist needed for safety, unable to perform posterior hygiene in stance on her own due to poor standing balance                            Cognition Arousal/Alertness: Awake/alert Behavior During  Therapy: WFL for tasks assessed/performed Overall Cognitive Status: Impaired/Different from baseline Area of Impairment: Memory;Problem solving                     Memory: Decreased short-term memory       Problem Solving: Difficulty sequencing;Requires verbal cues;Requires tactile cues;Slow processing General Comments: anxious regarding mobility but good participation with increased time to perform and gradual mobility progression (supine activity, seated activity, standing activity)      Exercises General Exercises - Lower Extremity Ankle Circles/Pumps: AROM;Both;10 reps;Supine Short Arc Quad: AROM;Both;10 reps;Supine Long Arc Quad: AROM;Both;Seated;10 reps Heel Slides: Both;10 reps;AROM;Seated Hip ABduction/ADduction: AROM;AAROM;Both;10 reps;Supine (AA on LLE, AROM on RLE) Straight Leg Raises: AAROM;Both;10 reps;Supine Hip Flexion/Marching: AROM;Seated;Both;20 reps;Sidelying (ROM limited R>L, x10 seated and x10 standing) Heel Raises: AROM;Both;10 reps;Standing Other Exercises Other Exercises: bridging hips x2 reps in bed    General Comments General comments (skin integrity, edema, etc.): HR ~100 bpm during mobility tasks, VSS and pt denies dizziness, BP not further assessed after chart review.      Pertinent Vitals/Pain Pain Assessment: Faces Faces Pain Scale: Hurts even more Pain Location: bottom with sitting, frontal headache Pain Descriptors / Indicators: Sore;Discomfort;Headache Pain Intervention(s): Monitored during session;Patient requesting pain meds-RN notified;Repositioned (per RN not due for Fioricet for HA until later in day, pt defers Tylenol; RN gave her soda with caffeine)    Home Living                      Prior Function            PT Goals (current goals can now be found in the care plan section) Acute Rehab PT Goals Patient Stated Goal: home PT Goal Formulation: With patient/family Time For Goal Achievement: 07/05/20 Potential to  Achieve Goals: Fair Progress towards PT goals: Progressing toward goals    Frequency    Min 3X/week      PT Plan Current plan remains appropriate    Co-evaluation              AM-PAC PT "6 Clicks" Mobility   Outcome Measure  Help needed turning from your back to your side while in a flat bed without using bedrails?: A Little Help needed moving from lying on your back to sitting on the side of a flat bed without using bedrails?: A Little Help needed moving to and from a bed to a chair (including a wheelchair)?: A Little Help needed standing up from a chair using your arms (e.g., wheelchair or bedside chair)?: A Little Help needed to walk in hospital room?: A Little Help needed climbing 3-5 steps with a railing? : A Lot 6 Click Score: 17    End of Session Equipment Utilized During Treatment: Gait belt Activity Tolerance: Patient tolerated treatment well Patient left: with call bell/phone within reach;in bed;with bed alarm set (NT entering room to help her wash her hair) Nurse Communication: Mobility status PT Visit Diagnosis: Muscle weakness (generalized) (M62.81);Other abnormalities of gait and mobility (R26.89);Difficulty in walking, not elsewhere classified (R26.2)     Time: 2671-2458 PT Time Calculation (min) (ACUTE ONLY):  40 min  Charges:  $Therapeutic Exercise: 23-37 mins $Therapeutic Activity: 8-22 mins                     Roel Douthat P., PTA Acute Rehabilitation Services Pager: [phone] Office: [place] 07/04/2020, 2:58 PM

## 2020-07-04 NOTE — Progress Notes (Signed)
Inpatient Rehabilitation Medication Review by a Pharmacist  A complete drug regimen review was completed for this patient to identify any potential clinically significant medication issues.  Clinically significant medication issues were identified:  no  Check AMION for pharmacist assigned to patient if future medication questions/issues arise during this admission.  Pharmacist comments:   Time spent performing this drug regimen review (minutes):  [place], PharmD 07/04/2020 7:14 PM  Please check [email or web address] for unit-specific pharmacy phone numbers.

## 2020-07-04 NOTE — Care Management Important Message (Signed)
Important Message  Patient Details  Name: Amy [name] MRN: [id number] Date of Birth: 12/25/1950   Medicare Important Message Given:  Yes     Daschel Roughton P Hasani Diemer 07/04/2020, 2:13 PM

## 2020-07-04 NOTE — H&P (Signed)
Physical Medicine and Rehabilitation Admission H&P        Chief Complaint  Patient presents with  . Debility       HPI: Amy [name] is a 70 year old female with history of ESRD- on PD, HTN, gastritis, A flutter, anxiety d/o, R-hip fracture s/p ORIF 04/2020, recent admission for diarrhea (found to be Covid + also) due to C diff colitis -> d/c to home on oral Vanc but unable to get meds who was readmitted on 06/19/20 with mental status changes due to brief seizure from multiple metabolic derangements from volume depletion. EEG showed nonspecific cortical dysfunction in bifrontal region with moderate diffuse encephalopathy. She was loaded with keppra and found to have elevated cardiac enzymes due to NSTEMI due to demand ischemia. Dr. Jory Sims recommends keppra 500 mg bid for new onset seizure with neuro follow up for adjustment or wean-->seizure provoked by multiple metabolic derangements and no driving X 6 months.    2D echo showed Takotsubo cardiomyopathy and combined systolic/diastolic. CHF being managed with PD and sinus tach managed with addition of BB.  CTA chest negative for PE but showed coronary calcification. She underwent cardiac cath revealing severe multivessel disease and underwent PCI 02/25 as not felt to be CABG candidate. She developed RUE hematoma post procedure which was treated with elevation/compression and acute on chronic anemia transfused with one unit PRBC. Dr. Felizardo Hoffmann team following for input on meds which have been optimized and limited by BP. She continues to be limited by fatigue and weakness with decreased WB LLE, delayed processing with memory deficits affecting activity. Due to PD unable to go to SNF but is showing improvement in activity tolerance and CIR recommended due to functional decline.       Review of Systems  Constitutional: Negative for chills and fever.  HENT: Negative for hearing loss and tinnitus.   Eyes: Negative for blurred vision.   Respiratory: Negative for cough and hemoptysis.   Cardiovascular: Negative for chest pain and palpitations.  Gastrointestinal: Negative for nausea and vomiting.  Genitourinary: Negative for dysuria.  Musculoskeletal: Negative for back pain, myalgias and neck pain.  Skin: Negative for itching.  Neurological: Positive for weakness and headaches.  Endo/Heme/Allergies: Bruises/bleeds easily.  Psychiatric/Behavioral: Negative for depression and suicidal ideas.          Past Medical History:  Diagnosis Date  . Anemia      a. 08/2016 Guaiac + stool. EGD w/ gastritis.  . Anxiety    . CKD (chronic kidney disease), stage III ([place])    . Closed fracture of shaft of left ulna 11/27/2018  . Colon polyp      a. 08/2016 Colonoscopy.  . Gastritis      a. 08/2016 EGD: Gastritis-->PPI.  [name] GERD (gastroesophageal reflux disease)    . History of chicken pox    . History of measles as a child    . Hypertension    . Hyponatremia      a. 08/2016 in setting of HCTZ Rx.  . Mesenteric lymphadenopathy 04/08/2017  . Multiple thyroid nodules             Past Surgical History:  Procedure Laterality Date  . ABDOMINAL HYSTERECTOMY   2003    due to heavy periods and clotting during menses  . BREAST LUMPECTOMY Left 1990  . CHOLECYSTECTOMY   2010  . COLONOSCOPY WITH PROPOFOL N/A 09/07/2016    Procedure: COLONOSCOPY WITH PROPOFOL;  Surgeon: Lucilla Lame, MD;  Location:  [facility];  Service: Endoscopy;  Laterality: N/A;  . CORONARY STENT INTERVENTION N/A 06/27/2020    Procedure: CORONARY STENT INTERVENTION;  Surgeon: Sherren Mocha, MD;  Location: [facility];  Service: Cardiovascular;  Laterality: N/A;  . DIALYSIS/PERMA CATHETER INSERTION N/A 11/16/2018    Procedure: DIALYSIS/PERMA CATHETER INSERTION;  Surgeon: Algernon Huxley, MD;  Location: [facility];  Service: Cardiovascular;  Laterality: N/A;  . DIALYSIS/PERMA CATHETER REMOVAL N/A 08/16/2019    Procedure: DIALYSIS/PERMA CATHETER REMOVAL;   Surgeon: Algernon Huxley, MD;  Location: [facility];  Service: Cardiovascular;  Laterality: N/A;  . DILATION AND CURETTAGE OF UTERUS   1985  . ESOPHAGOGASTRODUODENOSCOPY (EGD) WITH PROPOFOL N/A 09/07/2016    Procedure: ESOPHAGOGASTRODUODENOSCOPY (EGD) WITH PROPOFOL;  Surgeon: Lucilla Lame, MD;  Location: [facility];  Service: Endoscopy;  Laterality: N/A;  . GALLBLADDER SURGERY   2012  . INTRAMEDULLARY (IM) NAIL INTERTROCHANTERIC Right 04/23/2020    Procedure: INTRAMEDULLARY (IM) NAIL INTERTROCHANTRIC;  Surgeon: Thornton Park, MD;  Location: [facility];  Service: Orthopedics;  Laterality: Right;  . INTRAVASCULAR IMAGING/OCT N/A 06/27/2020    Procedure: INTRAVASCULAR IMAGING/OCT;  Surgeon: Sherren Mocha, MD;  Location: [facility];  Service: Cardiovascular;  Laterality: N/A;  . KNEE SURGERY        Dr. Tamala Julian  . LEFT HEART CATH AND CORONARY ANGIOGRAPHY N/A 06/25/2020    Procedure: LEFT HEART CATH AND CORONARY ANGIOGRAPHY;  Surgeon: Troy Sine, MD;  Location: [facility];  Service: Cardiovascular;  Laterality: N/A;  . TONSILLECTOMY      . TONSILLECTOMY AND ADENOIDECTOMY   1970  . UPPER GI ENDOSCOPY   03/02/2006    H. Pylori negative; Dr. Allen Norris           Family History  Problem Relation Age of Onset  . Pancreatic cancer Father    . Congestive Heart Failure Maternal Grandmother    . Cancer Paternal Grandfather    . Heart disease Maternal Uncle        Social History:  Married. 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  Husband works days in [place]. She reports that she has never smoked. She has never used smokeless tobacco. She reports current alcohol use--glass of wine occasionally. . She reports that she does not use drugs.           Allergies  Allergen Reactions  . Cephalosporins Hives and Swelling  . Clarithromycin Other (See Comments)      Unknown reaction  . Lunesta  [Eszopiclone]        Heart racing   . Penicillin V        Has patient had a PCN reaction causing immediate rash, facial/tongue/throat swelling, SOB or lightheadedness with hypotension: Yes Has patient had a PCN reaction causing severe rash involving mucus membranes or skin necrosis: No Has patient had a PCN reaction that required hospitalization No Has patient had a PCN reaction occurring within the last 10 years: No If all of the above answers are "NO", then may proceed with Cephalosporin use.  . Sulfa Antibiotics        Unknown reaction          Medications Prior to Admission  Medication Sig Dispense Refill  . buPROPion (WELLBUTRIN) 75 MG tablet TAKE 1 TABLET(75 MG) BY MOUTH TWICE DAILY (Patient taking differently: Take 75 mg by mouth 2 (two) times daily.) 180 tablet 3  . butalbital-acetaminophen-caffeine (FIORICET) 50-325-40 MG tablet TAKE 1  TO 2 TABLETS BY MOUTH EVERY 4 HOURS AS NEEDED FOR HEADACHE (Patient taking differently: Take 2 tablets by mouth every 6 (six) hours as needed for headache.) 150 tablet 5  . calcitRIOL (ROCALTROL) 0.5 MCG capsule Take 0.5 mcg by mouth daily.      . cinacalcet (SENSIPAR) 30 MG tablet Take 30 mg by mouth daily.      . cloNIDine (CATAPRES) 0.2 MG tablet Take 1 tablet (0.2 mg total) by mouth 2 (two) times daily. (Patient taking differently: Take 0.2 mg by mouth 3 (three) times daily.) 60 tablet 0  . esomeprazole (NEXIUM) 20 MG capsule Take 20 mg by mouth daily.      . hydrALAZINE (APRESOLINE) 50 MG tablet Take 1 tablet (50 mg total) by mouth 3 (three) times daily as needed. For SBP > 160 mm hg 90 tablet 0  . LORazepam (ATIVAN) 1 MG tablet TAKE 1 TABLET(1 MG) BY MOUTH TWICE DAILY AS NEEDED (Patient taking differently: Take 1 mg by mouth 2 (two) times daily as needed for anxiety or sleep.) 180 tablet 5  . losartan (COZAAR) 100 MG tablet Take 100 mg by mouth at bedtime.      . metoprolol tartrate (LOPRESSOR) 50 MG tablet Take 75 mg by mouth 2 (two) times daily.      . sennosides-docusate sodium  (SENOKOT-S) 8.6-50 MG tablet Take 2 tablets by mouth 2 (two) times daily as needed for constipation. 30 tablet 0  . sodium bicarbonate 650 MG tablet Take 1 tablet (650 mg total) by mouth 2 (two) times daily. 60 tablet 0  . torsemide (DEMADEX) 100 MG tablet Take 100 mg by mouth 2 (two) times daily.      . valACYclovir (VALTREX) 1000 MG tablet Take 1,000 mg by mouth 2 (two) times daily as needed (cold sores).      . zolpidem (AMBIEN) 10 MG tablet TAKE 1/2 TO 1 TABLET(5 TO 10 MG) BY MOUTH AT BEDTIME AS NEEDED (Patient taking differently: Take 10 mg by mouth at bedtime.) 30 tablet 5      Drug Regimen Review  Drug regimen was reviewed and remains appropriate with no significant issues identified   Home: Home Living Family/patient expects to be discharged to:: Private residence Living Arrangements: Spouse/significant other Available Help at Discharge: Family,Available 24 hours/day Type of Home: House Home Access: Stairs to enter [employer] of Steps: 2 Entrance Stairs-Rails: None Home Layout: Able to live on main level with bedroom/bathroom,Two level Bathroom Shower/Tub: Multimedia programmer: Standard Home Equipment: Environmental consultant - 2 wheels,Cane - single point,Bedside commode,Shower seat   Functional History: Prior Function Level of Independence: Needs assistance Gait / Transfers Assistance Needed: pt has been using RW since her hip fracture in dec ADL's / Homemaking Assistance Needed: spouse assisting with ADLs Comments: Indep without assist device for ADLs, household and community mobilization; works from home for [employer].  Denies additional fall history.   Functional Status:  Mobility: Bed Mobility Overal bed mobility: Needs Assistance Bed Mobility: Supine to Sit Rolling: Supervision Sidelying to sit: Min assist,Mod assist Supine to sit: Min assist Sit to supine: Mod assist General bed mobility comments: up in recliner on entry Transfers Overall  transfer level: Needs assistance Equipment used: Rolling walker (2 wheeled) Transfers: Sit to/from [employer] Sit to Stand: Min assist,Mod assist Stand pivot transfers: Min assist General transfer comment: PT allowed pt to problem solve initial stand from recliner, pt with poor hip, feet and hand placement so requires modA, requires min A  at hips to step transfer to [facility]. with maximal cuing pt able to set up for improved stand from [facility] on min physical assist required with additional 2 sit>stands Ambulation/Gait Ambulation/Gait assistance: Min assist Gait Distance (Feet): 10 Feet (10x 2 with seated rest break) Assistive device: Rolling walker (2 wheeled) Gait Pattern/deviations: Step-to pattern,Trunk flexed,Shuffle,Decreased stance time - left General Gait Details: minA at hips for safety and to improve pt confidence, pt continues with decreased L weightbearing, pt requires max encouragement, however surprised herself that she was able to accomplish Gait velocity: slowed Gait velocity interpretation: <1.31 ft/sec, indicative of household ambulator   ADL: ADL Overall ADL's : Needs assistance/impaired Eating/Feeding: Independent,Sitting Eating/Feeding Details (indicate cue type and reason): due to edema in arms/hands Grooming: Wash/dry hands,Wash/dry face,Set up,Sitting Grooming Details (indicate cue type and reason): BSC Upper Body Bathing: Maximal assistance,Bed level Lower Body Bathing: Maximal assistance Lower Body Bathing Details (indicate cue type and reason): min A sit<>stand Upper Body Dressing : Maximal assistance,Sitting Lower Body Dressing: Total assistance Lower Body Dressing Details (indicate cue type and reason): min A sit<>stand Toilet Transfer: Moderate assistance,Stand-pivot,BSC Toilet Transfer Details (indicate cue type and reason): one person face to face transfer [place] and Hygiene: Maximal assistance,Sit to/from  stand Toileting - Clothing Manipulation Details (indicate cue type and reason): Pt able to stand while OT performed gentle peri care Functional mobility during ADLs: Minimal assistance,Rolling walker General ADL Comments: decreased activity tolerance/balance   Cognition: Cognition Overall Cognitive Status: Impaired/Different from baseline Orientation Level: Oriented X4 Cognition Arousal/Alertness: Awake/alert Behavior During Therapy: WFL for tasks assessed/performed Overall Cognitive Status: Impaired/Different from baseline Area of Impairment: Memory,Problem solving Memory: Decreased short-term memory (can't remember she has already had headache medicine 2 hours ago) Problem Solving: Difficulty sequencing,Requires verbal cues,Requires tactile cues,Slow processing General Comments: Pt is tired of hospitalization and all things associated with being "stuck" in one room. - potentially we could take her outside in [facility] next session?     Blood pressure (!) 147/87, pulse 91, temperature 98.9 F (37.2 C), temperature source Oral, resp. rate 16, height 5\' 4"  (1.626 m), weight 50.7 kg, SpO2 99 %. Physical Exam Constitutional:      General: She is not in acute distress.    Appearance: She is ill-appearing.  HENT:     Head: Normocephalic and atraumatic.     Right Ear: External ear normal.     Left Ear: External ear normal.     Nose: Nose normal. No congestion.     Mouth/Throat:     Mouth: Mucous membranes are moist.     Pharynx: Oropharynx is clear. No oropharyngeal exudate.  Eyes:     Extraocular Movements: Extraocular movements intact.     Pupils: Pupils are equal, round, and reactive to light.  Cardiovascular:     Rate and Rhythm: Normal rate and regular rhythm.     Heart sounds: No murmur heard. No gallop.   Pulmonary:     Effort: Pulmonary effort is normal. No respiratory distress.     Breath sounds: No stridor. No wheezing or rhonchi.  Abdominal:     General: There is no  distension.     Palpations: Abdomen is soft.     Tenderness: There is no abdominal tenderness.     Comments: PD site clean, intact  Musculoskeletal:        General: Swelling and tenderness (RUE) present.     Cervical back: Normal range of motion and neck supple.     Right lower leg:  Edema present.     Left lower leg: Edema present.  Skin:    Findings: Bruising (diffuse bruising RUE, some bruising LUE as well.) present.  Neurological:     Comments: Alert and oriented x 3. Normal insight and awareness. Intact Memory. Normal language and speech. Cranial nerve exam unremarkable. Motor 4/5 bilateral UE's. LE's 4/5 prox to distal. No focal sensory findings.   Psychiatric:        Mood and Affect: Mood normal.        Behavior: Behavior normal.        Lab Results Last 48 Hours        Results for orders placed or performed during the hospital encounter of 06/19/20 (from the past 48 hour(s))  Renal function panel     Status: Abnormal    Collection Time: 07/03/20  3:01 AM  Result Value Ref Range    Sodium 131 (L) 135 - 145 mmol/L    Potassium 4.0 3.5 - 5.1 mmol/L    Chloride 98 98 - 111 mmol/L    CO2 21 (L) 22 - 32 mmol/L    Glucose, Bld 100 (H) 70 - 99 mg/dL      Comment: Glucose reference range applies only to samples taken after fasting for at least 8 hours.    BUN 27 (H) 8 - 23 mg/dL    Creatinine, Ser 4.71 (H) 0.44 - 1.00 mg/dL    Calcium 8.9 8.9 - 10.3 mg/dL    Phosphorus 3.0 2.5 - 4.6 mg/dL    Albumin 1.5 (L) 3.5 - 5.0 g/dL    GFR, Estimated 9 (L) >60 mL/min      Comment: (NOTE) Calculated using the CKD-EPI Creatinine Equation (2021)      Anion gap 12 5 - 15      Comment: Performed at [facility] [address], [place], [place] [address]      Imaging Results (Last 48 hours)  No results found.           Medical Problem List and Plan: 1.  Functional and mobility deficits secondary to debility after multiple medical issues             -patient may shower              -ELOS/Goals: 7-10 days, supervision with PT, OT 2.  Antithrombotics: -DVT/anticoagulation:  Mechanical:  Antiembolism stockings, knee (TED hose) Bilateral lower extremities Sequential compression devices, below knee Bilateral lower extremities             -antiplatelet therapy: ASA/plavix 3. Chronic daily frequent HA/Pain Management: tylenol for basic pain             -fioricet for breakthrough headaches (on this PTA) 4. Mood: fairly positive. Team to provide ego support as needed, SW eval             -antipsychotic agents: n/a             -trazodone prn for sleep 5. Neuropsych: This patient is capable of making decisions on her own behalf. 6. Skin/Wound Care: Routine pressure relief measures. 7. Fluids/Electrolytes/Nutrition:  Monitor I/O. Has low protein stores with alb-1.5             --Continue boost for supplement.              --Electrolyte abnormalities being addressed with PD.            8. ESRD: Continue daily PD             --  Hyponatremia stable around 130 9. CAD s/p PCI: On ASA/Plavix, metoprolol and lipitor.             --Monitor for symptoms with increase in activity.  10. New onset seizures: On Keppra bid             --off Wellbutrin. Monitor for anxiety symptoms. 11. H/o A flutter:  Monitor HR tid--continue metoprolol. 12. Anemia of chronic disease: Monitor with serial H/H--improved post transfusion.              --On Aranesp weekly 13. C diff colitis: Completed treatment with Dificid on 02/27             -off contact precautions 14.  Chronic combined CHF: Takatsubo CM with EF--25-30%             -- Monitor weights daily.              --On demadex bid with Kdur for supplement as well as metoprolol and statin.            Bary Leriche, PA-C 07/04/2020  I have personally performed a face to face diagnostic evaluation of this patient and formulated the key components of the plan.  Additionally, I have personally reviewed laboratory data, imaging studies, as well as  relevant notes and concur with the physician assistant's documentation above.  The patient's status has not changed from the original H&P.  Any changes in documentation from the acute care chart have been noted above.  Meredith Staggers, MD, [facility]

## 2020-07-04 NOTE — Progress Notes (Signed)
Patient ID: Amy [name], female   DOB: 06-15-1950, 70 y.o.   MRN: [id number] S: Feels well, no complaints O:BP (!) 145/82   Pulse 93   Temp 98.5 F (36.9 C) (Oral)   Resp 14   Ht 5\' 4"  (1.626 m)   Wt 50.7 kg   SpO2 100%   BMI 19.19 kg/m   Intake/Output Summary (Last 24 hours) at 07/04/2020 1456 Last data filed at 07/04/2020 1015 Gross per 24 hour  Intake 486 ml  Output -  Net 486 ml   Intake/Output: I/O last 3 completed shifts: In: 211 [P.O.:840; I.V.:3] Out: 0   Intake/Output this shift:  Total I/O In: 126 [P.O.:120; I.V.:6] Out: -  Weight change:  Gen: NAD CVS: RRR Resp: cta Abd: benign Ext: trace pedal edema bilaterally   Recent Labs  Lab 06/28/20 0121 06/29/20 0111 06/30/20 0046 07/02/20 0052 07/03/20 0301  NA 130* 131* 131* 132* 131*  K 4.0 3.7 4.0 3.4* 4.0  CL 98 97* 99 99 98  CO2 22 23 21* 18* 21*  GLUCOSE 108* 100* 114* 90 100*  BUN 32* 29* 30* 28* 27*  CREATININE 4.63* 4.65* 4.75* 4.68* 4.71*  ALBUMIN  --  1.4* 1.4*  --  1.5*  CALCIUM 8.2* 8.3* 8.2* 8.7* 8.9  PHOS  --  3.9 3.4  --  3.0   Liver Function Tests: Recent Labs  Lab 06/29/20 0111 06/30/20 0046 07/03/20 0301  ALBUMIN 1.4* 1.4* 1.5*   No results for input(s): LIPASE, AMYLASE in the last 168 hours. Recent Labs  Lab 06/29/20 1042  AMMONIA 74*   CBC: Recent Labs  Lab 06/27/20 1949 06/28/20 0121 06/29/20 0111 06/29/20 1042 06/30/20 0046 07/02/20 0052  WBC 9.8 8.8 7.3  --  10.4 8.0  HGB 7.5* 7.5* 6.7* 11.0* 9.8* 9.3*  HCT 22.8* 22.3* 19.1* 33.1* 29.4* 27.3*  MCV 91.2 90.3 89.7  --  89.9 91.3  PLT 236 238 238  --  227 243   Cardiac Enzymes: No results for input(s): CKTOTAL, CKMB, CKMBINDEX, TROPONINI in the last 168 hours. CBG: No results for input(s): GLUCAP in the last 168 hours.  Iron Studies: No results for input(s): IRON, TIBC, TRANSFERRIN, FERRITIN in the last 72 hours. Studies/Results: No results found. . sodium chloride   Intravenous Once  . acetaminophen  650  mg Oral Once  . aspirin  81 mg Oral Daily  . atorvastatin  80 mg Oral q1800  . Chlorhexidine Gluconate Cloth  6 each Topical Daily  . cinacalcet  30 mg Oral Q breakfast  . clopidogrel  75 mg Oral Daily  . darbepoetin (ARANESP) injection - NON-DIALYSIS  100 mcg Subcutaneous Q Sat-1800  . feeding supplement  1 Container Oral TID BM  . gentamicin cream  1 application Topical Daily  . levETIRAcetam  500 mg Oral BID  . metoprolol succinate  25 mg Oral Daily  . multivitamin  1 tablet Oral QHS  . pantoprazole  40 mg Oral Daily  . potassium chloride  10 mEq Oral Daily  . sodium chloride flush  3 mL Intravenous Once  . sodium chloride flush  3 mL Intravenous Q12H  . sodium chloride flush  3 mL Intravenous Q12H  . torsemide  100 mg Oral BID    BMET    Component Value Date/Time   NA 131 (L) 07/03/2020 0301   NA 135 11/08/2018 1620   K 4.0 07/03/2020 0301   CL 98 07/03/2020 0301   CO2 21 (L) 07/03/2020 0301  GLUCOSE 100 (H) 07/03/2020 0301   BUN 27 (H) 07/03/2020 0301   BUN 77 (HH) 11/08/2018 1620   CREATININE 4.71 (H) 07/03/2020 0301   CREATININE 1.93 (H) 04/05/2017 1451   CALCIUM 8.9 07/03/2020 0301   GFRNONAA 9 (L) 07/03/2020 0301   GFRAA 7 (L) 12/17/2019 0444   CBC    Component Value Date/Time   WBC 8.0 07/02/2020 0052   RBC 2.99 (L) 07/02/2020 0052   HGB 9.3 (L) 07/02/2020 0052   HGB 9.2 (L) 11/08/2018 1620   HCT 27.3 (L) 07/02/2020 0052   HCT 28.5 (L) 11/08/2018 1620   PLT 243 07/02/2020 0052   PLT 235 11/08/2018 1620   MCV 91.3 07/02/2020 0052   MCV 88 11/08/2018 1620   MCH 31.1 07/02/2020 0052   MCHC 34.1 07/02/2020 0052   RDW 17.9 (H) 07/02/2020 0052   RDW 16.1 (H) 11/08/2018 1620   LYMPHSABS 1.2 06/27/2020 0026   LYMPHSABS 0.8 11/08/2018 1620   MONOABS 0.8 06/27/2020 0026   EOSABS 0.2 06/27/2020 0026   EOSABS 0.4 11/08/2018 1620   BASOSABS 0.1 06/27/2020 0026   BASOSABS 0.1 11/08/2018 1620    Dialysis Orders: [place] followed by  Dr. Candiss Norse  Assessment/Plan: 1. Multivessel CAD with reduced EF 25/30%: HF team following. Eval by TCTS"poor CABG candidate" status post PCI to LAD with DES on 2/25.  on Plavix and ASA 81 mg, high-dose statin 2. ESRD -stable on PD, also on torsemide 100 mg daily with some results per patient minimal documentation I/Os.last K improved to 4.0.Continue KCl 29meq PO BID.   3. HTN/volume -BPin goal, on metoprolol 25 mg daily and okay to start losartan 25 mg as outpatient per cardiology recommendation.  Continue torsemide and PD to reduce volume.  Per patient improved.  4. Anemia -Hgb^9.3 s/p 1 unitPRBCs on 2/20 and again 2/27.  Continue  Aranesp 100 MCG q Saturday. t sat 91%,ferritin 1131.  No need for iron at this time. 5. Secondary hyperparathyroidismno binder listed, on Sensipar 30,stoppedRocaltrol with elevated corrected calcium>10 6. Hyponatremia - chronic and may need to use all 2.5% bags tomorrow is remains low.  7. Nutrition: Currently on dysphagia 3 diet.  Albumin low at 1.4, noted on Ensure 3 times daily,follow labs and change to renal diet if K or phos start to trend up.  8. Seizures - do to electrolyte abnormality in setting of C diff.  Now on Keppra with neruo f/u. 9. C diff - finished ABX course on 2/27.  10. Disposition-CIR admit for rehab   Donetta Potts, MD [employer] [phone]

## 2020-07-04 NOTE — Progress Notes (Signed)
Signed        PMR Admission Coordinator Pre-Admission Assessment   Patient: Amy [name] is an 70 y.o., female MRN: [id number] DOB: Nov 10, 1950 Height: _0  (162.6 cm) Weight: 50.7 kg   Insurance Information HMO:     PPO:      PCP:      IPA:      80/20:      OTHER:  PRIMARY: Medicare A and B      Policy#: [id number]      Subscriber: patient CM Name:        Phone#:       Fax#:   Pre-Cert#:        Employer: Retired Benefits:  Phone #:      Name: Checked in passport one source [employer] Date: Medicare A effective 10/31/13 and Medicare B effective 04/02/16     Deduct: $1484      Out of Pocket Max: None      Life Max: N/A CIR: 100%      SNF: 100 days Outpatient: 80%     Co-Pay: 20% Home Health: 100%      Co-Pay: none DME: 80%     Co-Pay: 20% Providers: patient's choice  SECONDARY: [employer] supplement      Policy#: [id number]     Phone#: [phone]   Financial Counselor:        Phone#:     The "Data Collection Information Summary" for patients in Inpatient Rehabilitation Facilities with attached "Privacy Act [place] Records" was provided and verbally reviewed with: Patient   Emergency Contact Information         Contact Information     Name Relation Home Work Mobile    [name],Amy Spouse     [place] Daughter [phone]   [phone]         Current Medical History  Patient Admitting Diagnosis: Debility   History of Present Illness: 70 y.o. female with medical history significant of ESRD on PD, HTN, multiple thyroid nodules. She had a hip fracture in December s/p ORIF. Recently hospitalized 1/25/-05/29/20 for watery diarrhea, hyponatremia, hypokalemia. She was found to be covid 19 positive (05/29/20) but was not treated with no signs of respiratory involvement. She did test positive for C. Diff by PCR and was started on oral Vancomycin with a 9 day Rx at discharge which she was unable to get filled. She also developed sinus tachycardia with  NSTEMI for which she was seen by cardiology. She is s/p heart cath and evaluated by CVTS for CABG. Deemed not a candidate so went cath lab 2/25 for stent.  Unable to go to SNF due to PD.  Await CIR bed.     Complete NIHSS TOTAL: 6   Patient's medical record from [facility] has been reviewed by the rehabilitation admission coordinator and physician.   Past Medical History      Past Medical History:  Diagnosis Date  . Anemia      a. 08/2016 Guaiac + stool. EGD w/ gastritis.  . Anxiety    . CKD (chronic kidney disease), stage III ([place])    . Closed fracture of shaft of left ulna 11/27/2018  . Colon polyp      a. 08/2016 Colonoscopy.  . Gastritis      a. 08/2016 EGD: Gastritis-->PPI.  [name] GERD (gastroesophageal reflux disease)    . History of chicken pox    . History of measles as a child    . Hypertension    .  Hyponatremia      a. 08/2016 in setting of HCTZ Rx.  . Mesenteric lymphadenopathy 04/08/2017  . Multiple thyroid nodules        Family History   family history includes Cancer in her paternal grandfather; Congestive Heart Failure in her maternal grandmother; Heart disease in her maternal uncle; Pancreatic cancer in her father.   Prior Rehab/Hospitalizations Has the patient had prior rehab or hospitalizations prior to admission? Yes   Has the patient had major surgery during 100 days prior to admission? Yes              Current Medications   Current Facility-Administered Medications:  .  0.9 %  sodium chloride infusion (Manually program via Guardrails IV Fluids), , Intravenous, Once, Thurnell Lose, MD, Held at 06/22/20 [id number] .  0.9 %  sodium chloride infusion, 250 mL, Intravenous, PRN, Sherren Mocha, MD .  acetaminophen (TYLENOL) tablet 650 mg, 650 mg, Oral, Q4H PRN, Geradine Girt, DO, 650 mg at 06/27/20 2050 .  acetaminophen (TYLENOL) tablet 650 mg, 650 mg, Oral, Once, Chotiner, Yevonne Aline, MD .  aspirin chewable tablet 81 mg, 81 mg, Oral, Daily, Troy Sine, MD, 81  mg at 07/04/20 1006 .  atorvastatin (LIPITOR) tablet 80 mg, 80 mg, Oral, q1800, Troy Sine, MD, 80 mg at 07/03/20 1831 .  butalbital-acetaminophen-caffeine (FIORICET) 50-325-40 MG per tablet 1-2 tablet, 1-2 tablet, Oral, Q6H PRN, Eulogio Bear U, DO, 2 tablet at 07/04/20 1006 .  Chlorhexidine Gluconate Cloth 2 % PADS 6 each, 6 each, Topical, Daily, Norins, Heinz Knuckles, MD, 6 each at 07/03/20 1400 .  cinacalcet (SENSIPAR) tablet 30 mg, 30 mg, Oral, Q breakfast, Reesa Chew, MD, 30 mg at 07/04/20 0751 .  clopidogrel (PLAVIX) tablet 75 mg, 75 mg, Oral, Daily, Lyda Jester M, PA-C, 75 mg at 07/04/20 1006 .  Darbepoetin Alfa (ARANESP) injection 100 mcg, 100 mcg, Subcutaneous, Q Sat-1800, Claudia Desanctis, MD, 100 mcg at 06/28/20 1712 .  diazepam (VALIUM) tablet 5 mg, 5 mg, Oral, Q6H PRN, Troy Sine, MD, 5 mg at 07/01/20 0001 .  feeding supplement (ENSURE ENLIVE / ENSURE PLUS) liquid 237 mL, 237 mL, Oral, TID BM, Vann, Jessica U, DO, 237 mL at 07/02/20 2236 .  gentamicin cream (GARAMYCIN) 0.1 % 1 application, 1 application, Topical, Daily, Reesa Chew, MD, 1 application at 88/91/69 2236 .  Gerhardt's butt cream, , Topical, PRN, Thurnell Lose, MD, Given at 06/22/20 1616 .  heparin 2,500 Units in dialysis solution 2.5% low-MG/low-CA 5,000 mL dialysis solution, , Peritoneal Dialysis, PRN, Reesa Chew, MD .  levETIRAcetam (KEPPRA) tablet 500 mg, 500 mg, Oral, BID, Vann, Jessica U, DO, 500 mg at 07/04/20 1006 .  metoprolol succinate (TOPROL-XL) 24 hr tablet 25 mg, 25 mg, Oral, Daily, Larey Dresser, MD, 25 mg at 07/04/20 1006 .  multivitamin (RENA-VIT) tablet 1 tablet, 1 tablet, Oral, QHS, Thurnell Lose, MD, 1 tablet at 07/03/20 2144 .  ondansetron (ZOFRAN) injection 4 mg, 4 mg, Intravenous, Q6H PRN, Troy Sine, MD, 4 mg at 06/25/20 2341 .  pantoprazole (PROTONIX) EC tablet 40 mg, 40 mg, Oral, Daily, Vann, Jessica U, DO, 40 mg at 07/04/20 1006 .  potassium chloride  (KLOR-CON) CR tablet 10 mEq, 10 mEq, Oral, Daily, Claudia Desanctis, MD, 10 mEq at 07/04/20 1006 .  sodium chloride flush (NS) 0.9 % injection 3 mL, 3 mL, Intravenous, Once, Carmin Muskrat, MD .  sodium chloride flush (NS) 0.9 %  injection 3 mL, 3 mL, Intravenous, Q12H, Kathyrn Drown D, NP, 3 mL at 06/30/20 1123 .  sodium chloride flush (NS) 0.9 % injection 3 mL, 3 mL, Intravenous, Q12H, Sherren Mocha, MD, 3 mL at 07/03/20 2144 .  sodium chloride flush (NS) 0.9 % injection 3 mL, 3 mL, Intravenous, PRN, Sherren Mocha, MD .  torsemide [facility]) tablet 100 mg, 100 mg, Oral, BID, Vann, Jessica U, DO, 100 mg at 07/04/20 0752 .  traZODone (DESYREL) tablet 25 mg, 25 mg, Oral, QHS PRN, Norins, Heinz Knuckles, MD, 25 mg at 07/03/20 2142   Patients Current Diet:     Diet Order                      DIET DYS 3 Room service appropriate? Yes; Fluid consistency: Thin  Diet effective now                      Precautions / Restrictions Precautions Precautions: Fall Precaution Comments: weakness, peritoneal diaylisis, swollen arms Restrictions Weight Bearing Restrictions: No    Has the patient had 2 or more falls or a fall with injury in the past year? Yes   Prior Activity Level Limited Community (1-2x/wk): Went out to dinner 2-3 X a week.  Has not driven since Dec 9485 due to hip fracture.   Prior Functional Level Self Care: Did the patient need help bathing, dressing, using the toilet or eating? Needed some help   Indoor Mobility: Did the patient need assistance with walking from room to room (with or without device)? Needed some help   Stairs: Did the patient need assistance with internal or external stairs (with or without device)? Needed some help   Functional Cognition: Did the patient need help planning regular tasks such as shopping or remembering to take medications? Independent   Home Assistive Devices / [place] Devices/Equipment: Environmental consultant (specify type) Home  Equipment: Walker - 2 wheels,Cane - single point,Bedside commode,Shower seat   Prior Device Use: Indicate devices/aids used by the patient prior to current illness, exacerbation or injury? Walker   Current Functional Level Cognition   Overall Cognitive Status: Impaired/Different from baseline Orientation Level: Oriented X4 General Comments: Pt is tired of hospitalization and all things associated with being "stuck" in one room. - potentially we could take her outside in [facility] next session?    Extremity Assessment (includes Sensation/Coordination)   Upper Extremity Assessment: RUE deficits/detail,LUE deficits/detail RUE Deficits / Details: Bil UE edema from not being on peritoneal dialysis for several days. Also generalized weakness throughout, full PROM but needs A for shoulder ROM;elbow distally 3/5 except digits due to edema RUE Coordination: decreased fine motor,decreased gross motor LUE Deficits / Details: Bil UE edema from not being on peritoneal dialysis for several days. Also generalized weakness throughout, full PROM but needs A for shoulder ROM;elbow distally 3/5 except digits due to edema LUE Coordination: decreased fine motor,decreased gross motor  Lower Extremity Assessment: Generalized weakness     ADLs   Overall ADL's : Needs assistance/impaired Eating/Feeding: Independent,Sitting Eating/Feeding Details (indicate cue type and reason): due to edema in arms/hands Grooming: Wash/dry hands,Wash/dry face,Set up,Sitting Grooming Details (indicate cue type and reason): BSC Upper Body Bathing: Maximal assistance,Bed level Lower Body Bathing: Maximal assistance Lower Body Bathing Details (indicate cue type and reason): min A sit<>stand Upper Body Dressing : Maximal assistance,Sitting Lower Body Dressing: Total assistance Lower Body Dressing Details (indicate cue type and reason): min A sit<>stand Toilet Transfer: Moderate assistance,Stand-pivot,BSC  Toilet Transfer Details (indicate  cue type and reason): one person face to face transfer [place] and Hygiene: Maximal assistance,Sit to/from stand Toileting - Clothing Manipulation Details (indicate cue type and reason): Pt able to stand while OT performed gentle peri care Functional mobility during ADLs: Minimal assistance,Rolling walker General ADL Comments: decreased activity tolerance/balance     Mobility   Overal bed mobility: Needs Assistance Bed Mobility: Supine to Sit Rolling: Supervision Sidelying to sit: Min assist,Mod assist Supine to sit: Min assist Sit to supine: Mod assist General bed mobility comments: up in recliner on entry     Transfers   Overall transfer level: Needs assistance Equipment used: Rolling walker (2 wheeled) Transfers: Sit to/from [employer] Sit to Stand: Min assist,Mod assist Stand pivot transfers: Min assist General transfer comment: PT allowed pt to problem solve initial stand from recliner, pt with poor hip, feet and hand placement so requires modA, requires min A at hips to step transfer to [facility]. with maximal cuing pt able to set up for improved stand from [facility] on min physical assist required with additional 2 sit>stands     Ambulation / Gait / Stairs / Wheelchair Mobility   Ambulation/Gait Ambulation/Gait assistance: Herbalist (Feet): 10 Feet (10x 2 with seated rest break) Assistive device: Rolling walker (2 wheeled) Gait Pattern/deviations: Step-to pattern,Trunk flexed,Shuffle,Decreased stance time - left General Gait Details: minA at hips for safety and to improve pt confidence, pt continues with decreased L weightbearing, pt requires max encouragement, however surprised herself that she was able to accomplish Gait velocity: slowed Gait velocity interpretation: <1.31 ft/sec, indicative of household ambulator     Posture / Balance Dynamic Sitting Balance Sitting balance - Comments: able to sit EOB without PT  assist Balance Overall balance assessment: Needs assistance Sitting-balance support: Feet supported,No upper extremity supported Sitting balance-Leahy Scale: Fair Sitting balance - Comments: able to sit EOB without PT assist Standing balance support: Single extremity supported,Bilateral upper extremity supported Standing balance-Leahy Scale: Poor Standing balance comment: able to static stand after incontinence of bowels for ~3 min while PT gathered washcloth, and linens, no dizziness or LoB noted     Special needs/care consideration Dialysis: Peritoneal, Skin Healing incision right hip and Special service needs On PD at night    Previous Home Environment (from acute therapy documentation) Living Arrangements: Spouse/significant other Available Help at Discharge: Family,Available 24 hours/day Type of Home: House Home Layout: Able to live on main level with bedroom/bathroom,Two level Home Access: Stairs to enter Entrance Stairs-Rails: None Entrance Stairs-Number of Steps: 2 Bathroom Shower/Tub: Multimedia programmer: Standard Home Care Services: No   Discharge Living Setting Plans for Discharge Living Setting: Patient's home,House,Lives with (comment) (Lives with husband) Type of Home at Discharge: House Discharge Home Layout: Two level,Able to live on main level with bedroom/bathroom (does not have to go up to 2nd level) Alternate Level Stairs-Number of Steps: Flight Discharge Home Access: Stairs to enter Entrance Stairs-Rails: None Entrance Stairs-Number of Steps: 2 (2 steps from garage to [employer]) Discharge Bathroom Shower/Tub: Walk-in shower,Door Discharge Bathroom Toilet: Handicapped height Discharge Bathroom Accessibility: Yes How Accessible: Accessible via walker Does the patient have any problems obtaining your medications?: No   Social/Family/Support Systems Patient Roles: Spouse,Parent (Has husband of 65 years and a daughter) Sport and exercise psychologist Information: Myrlene Riera - husband Anticipated Caregiver: Husband and daughter Ability/Limitations of Caregiver: Husband works 730 am to 5 pm Caregiver Availability: 24/7 (Can get caregiver assistance 24/7) Discharge Plan Discussed with Primary  Caregiver: Yes Is Caregiver In Agreement with Plan?: Yes Does Caregiver/Family have Issues with Lodging/Transportation while Pt is in Rehab?: No   Goals Patient/Family Goal for Rehab: PT/OT Supervision goals Expected length of stay: 7-10 days Cultural Considerations: None Pt/Family Agrees to Admission and willing to participate: Yes Program Orientation Provided & Reviewed with Pt/Caregiver Including Roles  & Responsibilities: Yes   Decrease burden of Care through IP rehab admission: N/A   Possible need for SNF placement upon discharge: Not anticipated   Patient Condition: I have reviewed medical records from [facility], spoken with CM, and patient. I met with patient at the bedside for inpatient rehabilitation assessment.  Patient will benefit from ongoing PT and OT, can actively participate in 3 hours of therapy a day 5 days of the week, and can make measurable gains during the admission.  Patient will also benefit from the coordinated team approach during an Inpatient Acute Rehabilitation admission.  The patient will receive intensive therapy as well as Rehabilitation physician, nursing, social worker, and care management interventions.  Due to bladder management, bowel management, safety, skin/wound care, disease management, medication administration, pain management and patient education the patient requires 24 hour a day rehabilitation nursing.  The patient is currently Min assist with mobility and basic ADLs.  Discharge setting and therapy post discharge at home with home health is anticipated.  Patient has agreed to participate in the Acute Inpatient Rehabilitation Program and will admit today.   Preadmission Screen Completed By: Retta Diones with day of  admission updates by Bethel Born, 07/04/2020 10:16 AM ______________________________________________________________________   Discussed status with Dr. Naaman Plummer on 07/04/20  at 10:16 AM and received approval for admission today.   Admission Coordinator: Retta Diones, RN with day of admission updates by Bethel Born, CCC-SLP, time 10:16 AM Sudie Grumbling 07/04/20     Assessment/Plan: Diagnosis: debility after multiple medical 1. Does the need for close, 24 hr/day Medical supervision in concert with the patient's rehab needs make it unreasonable for this patient to be served in a less intensive setting? Yes 2. Co-Morbidities requiring supervision/potential complications: ESRD/PD, htn, c diff diarrhea, NSTEMI 3. Due to bladder management, bowel management, safety, skin/wound care, disease management, medication administration, pain management and patient education, does the patient require 24 hr/day rehab nursing? Yes 4. Does the patient require coordinated care of a physician, rehab nurse, PT, OT, and SLP to address physical and functional deficits in the context of the above medical diagnosis(es)? Yes Addressing deficits in the following areas: balance, endurance, locomotion, strength, transferring, bowel/bladder control, bathing, dressing, feeding, grooming, toileting and psychosocial support 5. Can the patient actively participate in an intensive therapy program of at least 3 hrs of therapy 5 days a week? Yes 6. The potential for patient to make measurable gains while on inpatient rehab is excellent 7. Anticipated functional outcomes upon discharge from inpatient rehab: supervision PT, supervision OT, n/a SLP 8. Estimated rehab length of stay to reach the above functional goals is: 7-10 days 9. Anticipated discharge destination: Home 10. Overall Rehab/Functional Prognosis: excellent     MD Signature: Meredith Staggers, MD, [facility] 07/04/2020

## 2020-07-04 NOTE — Plan of Care (Signed)
  Problem: Education: Goal: Knowledge of General Education information will improve Description Including pain rating scale, medication(s)/side effects and non-pharmacologic comfort measures Outcome: Progressing   

## 2020-07-04 NOTE — Progress Notes (Signed)
Inpatient Rehab Admissions Coordinator:  There is a bed in CIR to admit pt today. Dr. Maren Beach is aware and in agreement. NSG, TOC, and pt aware.   Gayland Curry, [place], [place] Admissions Coordinator [phone]

## 2020-07-04 NOTE — Plan of Care (Signed)
  Problem: Activity: Goal: Risk for activity intolerance will decrease Outcome: Progressing   Problem: Nutrition: Goal: Adequate nutrition will be maintained Outcome: Progressing   

## 2020-07-04 NOTE — Progress Notes (Signed)
PROGRESS NOTE    Amy [name]  JHE:[id number] DOB: Jun 04, 1950 DOA: 06/19/2020 PCP: [name], MD   Chief Complaint  Patient presents with  . Code Stroke  Brief Narrative:  70 y.o. female with medical history significant ofESRD on PD, HTN, multiple thyroid nodules. She had a hip fracture in December s/p ORIF. Recently hospitalized 1/25/-05/29/20 for watery diarrhea, hyponatremia, hypokalemia. She was found to be covid 19 positive but was not treated with no signs of respiratory involvement. She did test positive for C. Diff by PCR and was started on oral Vancomycin with a 9 day Rx at discharge which she was unable to get filled.She also developed sinus tachycardia with NSTEMI for which she was seen by cardiology. She is s/p heart cath and evaluated by CVTS for CABG. Deemed not a candidate so went cath lab 2/25 for stent.  Unable to go to SNF due to PD.  Await CIR bed. Medically stable for CIR and is being discharged today 3/4  Subjective: Seen and examined this morning no new complaints.  Resting comfortably.     Assessment & Plan:  Seizure with postictal state in the setting of severe electrolyte abnormalities including hyponatremia hypomagnesemia hypokalemia due to C. difficile diarrhea: Patient extensive work-up including negative MRI, EEG with postictal state, seen by neurology.  Electrolytes have improved.  Per neurology continue Keppra and outpatient neurology follow-up and discontinued Wellbutrin.  Outpatient follow-up  C. difficile infection on admission treated with Dificid -completed 2/27, failed 10 days course of vancomycin ,?    ESRD on PD continue plan as per nephrology was on board.   Anemia chronic kidney disease and also from chronic disease no active signs of bleeding received 2 units PRBC (2/20 and 2/27).  Check CBC in few days at [facility]  Lab 06/28/20 0121 06/29/20 0111 06/29/20 1042 06/30/20 0046 07/02/20 0052  HGB 7.5* 6.7* 11.0* 9.8* 9.3*  HCT  22.3* 19.1* 33.1* 29.4* 27.3*   Multivessel CAD/NSTEMI-negative CT chest and ultrasound for DVT Echo with EF 25% with typical wall motion regularities underwent cardiac cath showed-severe multivessel CAD with 80%, 90% 80% proximal LAD stenosis, 90% diagonal stenosis and 85% focal ramus intermedius stenosis.  Seen by CVTS not a candidate for CABG, unable to get cardiac MRI done cath on 2/25 with stent placement continue aspirin/Plavix for at least 1 year.  Continue torsemide, Toprol, Lipitor.  Denies any chest pain.  Doing well.  Chronic systolic CHF EF 25 to 18%/HUDJSHFW cardiomyopathy: fluid removal per PD.  Appears euvolemic.  Continue potassium chloride, torsemide, Toprol.  Anxiety disorder.  Stable  Protein-calorie malnutrition, severe-as below  Diet Order            DIET DYS 3 Room service appropriate? Yes; Fluid consistency: Thin  Diet effective now                 Nutrition Problem: Severe Malnutrition Etiology: acute illness (C. difficile, recent COVID-19 infection) Signs/Symptoms: moderate fat depletion,severe muscle depletion Interventions: MVI,Nepro shake Patient's Body mass index is 19.19 kg/m.  Pressure Injury 06/20/20 Coccyx Medial Stage 1 -  Intact skin with non-blanchable redness of a localized area usually over a bony prominence. (Active)  06/20/20 1500  Location: Coccyx  Location Orientation: Medial  Staging: Stage 1 -  Intact skin with non-blanchable redness of a localized area usually over a bony prominence.  Wound Description (Comments):   Present on Admission: Yes    DVT prophylaxis: SCD's Start: 06/25/20 1102 Code Status:  Code Status: Full Code  Family Communication: plan of care discussed with patient at bedside.  Status is: Inpatient Remains inpatient appropriate because:Unsafe d/c plan and Inpatient level of care appropriate due to severity of illness  Dispo: The patient is from: Home              Anticipated d/c is to: CIR today as they have a  bed              Patient currently is medically stable to d/c.   Difficult to place patient No   Unresulted Labs (From admission, onward)          Start     Ordered   06/22/20 0616  Body fluid culture  Once,   R       Comments: On daytime exchange   Question:  Are there also cytology or pathology orders on this specimen?  Answer:  No   06/22/20 0616   Unscheduled  Occult blood card to lab, stool  As needed,   R      06/29/20 0228          Medications reviewed:  Scheduled Meds: . sodium chloride   Intravenous Once  . acetaminophen  650 mg Oral Once  . aspirin  81 mg Oral Daily  . atorvastatin  80 mg Oral q1800  . Chlorhexidine Gluconate Cloth  6 each Topical Daily  . cinacalcet  30 mg Oral Q breakfast  . clopidogrel  75 mg Oral Daily  . darbepoetin (ARANESP) injection - NON-DIALYSIS  100 mcg Subcutaneous Q Sat-1800  . feeding supplement  237 mL Oral TID BM  . gentamicin cream  1 application Topical Daily  . levETIRAcetam  500 mg Oral BID  . metoprolol succinate  25 mg Oral Daily  . multivitamin  1 tablet Oral QHS  . pantoprazole  40 mg Oral Daily  . potassium chloride  10 mEq Oral Daily  . sodium chloride flush  3 mL Intravenous Once  . sodium chloride flush  3 mL Intravenous Q12H  . sodium chloride flush  3 mL Intravenous Q12H  . torsemide  100 mg Oral BID   Continuous Infusions: . sodium chloride      Consultants:see note  Procedures:see note  Antimicrobials: Anti-infectives (From admission, onward)   Start     Dose/Rate Route Frequency Ordered Stop   06/29/20 2200  rifaximin (XIFAXAN) tablet 550 mg        550 mg Oral 2 times daily 06/29/20 1638 06/30/20 1108   06/19/20 2200  fidaxomicin (DIFICID) tablet 200 mg  Status:  Discontinued        200 mg Oral 2 times daily 06/19/20 2005 06/29/20 1210     Culture/Microbiology    Component Value Date/Time   SDES BLOOD BLOOD RIGHT HAND 05/29/2020 2043   SPECREQUEST  05/29/2020 2043    BOTTLES DRAWN AEROBIC AND  ANAEROBIC Blood Culture adequate volume   CULT  05/29/2020 2043    NO GROWTH 5 DAYS Performed at [facility], [address] [place], [place] [address]    REPTSTATUS 06/03/2020 FINAL 05/29/2020 2043    Other culture-see note  Objective: Vitals: Today's Vitals   07/03/20 2028 07/03/20 2129 07/04/20 0449 07/04/20 0800  BP: 121/74  117/69 (!) 147/87  Pulse: 94  91   Resp: 15  18 16   Temp: 98.4 F (36.9 C)  98.9 F (37.2 C)   TempSrc: Oral  Oral   SpO2: 99%  97% 99%  Weight:  Height:      PainSc:  0-No pain      Intake/Output Summary (Last 24 hours) at 07/04/2020 0951 Last data filed at 07/03/2020 1700 Gross per 24 hour  Intake 603 ml  Output --  Net 603 ml   Filed Weights   07/01/20 1555 07/02/20 0454 07/02/20 0650  Weight: 54.7 kg 51.4 kg 50.7 kg   Weight change:   Intake/Output from previous day: 03/03 0701 - 03/04 0700 In: 702 [P.O.:840; I.V.:3] Out: -  Intake/Output this shift: No intake/output data recorded. Filed Weights   07/01/20 1555 07/02/20 0454 07/02/20 0650  Weight: 54.7 kg 51.4 kg 50.7 kg    Examination:  General exam: AAOX3 , NAD, weak appearing. HEENT:Oral mucosa moist, Ear/Nose WNL grossly, dentition normal. Respiratory system: bilaterally CLEAR,no wheezing or crackles,no use of accessory muscle Cardiovascular system: S1 & S2 +, No JVD,. Gastrointestinal system: Abdomen soft, NT,ND, BS+ Nervous System:Alert, awake, moving extremities and grossly nonfocal Extremities: No edema, distal peripheral pulses palpable.  Skin: No rashes,no icterus. MSK: Normal muscle bulk,tone, power Bruise on the right arm improving  Data Reviewed: I have personally reviewed following labs and imaging studies CBC: Recent Labs  Lab 06/27/20 1949 06/28/20 0121 06/29/20 0111 06/29/20 1042 06/30/20 0046 07/02/20 0052  WBC 9.8 8.8 7.3  --  10.4 8.0  HGB 7.5* 7.5* 6.7* 11.0* 9.8* 9.3*  HCT 22.8* 22.3* 19.1* 33.1* 29.4* 27.3*  MCV 91.2 90.3 89.7   --  89.9 91.3  PLT 236 238 238  --  227 637   Basic Metabolic Panel: Recent Labs  Lab 06/28/20 0121 06/29/20 0111 06/30/20 0046 07/02/20 0052 07/03/20 0301  NA 130* 131* 131* 132* 131*  K 4.0 3.7 4.0 3.4* 4.0  CL 98 97* 99 99 98  CO2 22 23 21* 18* 21*  GLUCOSE 108* 100* 114* 90 100*  BUN 32* 29* 30* 28* 27*  CREATININE 4.63* 4.65* 4.75* 4.68* 4.71*  CALCIUM 8.2* 8.3* 8.2* 8.7* 8.9  MG  --   --  1.6* 2.0  --   PHOS  --  3.9 3.4  --  3.0   GFR: Estimated Creatinine Clearance: 8.8 mL/min (A) (by C-G formula based on SCr of 4.71 mg/dL (H)). Liver Function Tests: Recent Labs  Lab 06/29/20 0111 06/30/20 0046 07/03/20 0301  ALBUMIN 1.4* 1.4* 1.5*   No results for input(s): LIPASE, AMYLASE in the last 168 hours. Recent Labs  Lab 06/29/20 1042  AMMONIA 74*   Coagulation Profile: No results for input(s): INR, PROTIME in the last 168 hours. Cardiac Enzymes: No results for input(s): CKTOTAL, CKMB, CKMBINDEX, TROPONINI in the last 168 hours. BNP (last 3 results) No results for input(s): PROBNP in the last 8760 hours. HbA1C: No results for input(s): HGBA1C in the last 72 hours. CBG: No results for input(s): GLUCAP in the last 168 hours. Lipid Profile: No results for input(s): CHOL, HDL, LDLCALC, TRIG, CHOLHDL, LDLDIRECT in the last 72 hours. Thyroid Function Tests: No results for input(s): TSH, T4TOTAL, FREET4, T3FREE, THYROIDAB in the last 72 hours. Anemia Panel: No results for input(s): VITAMINB12, FOLATE, FERRITIN, TIBC, IRON, RETICCTPCT in the last 72 hours. Sepsis Labs: No results for input(s): PROCALCITON, LATICACIDVEN in the last 168 hours.  No results found for this or any previous visit (from the past 240 hour(s)).   Radiology Studies: No results found.   LOS: 15 days   Antonieta Pert, MD [facility]  07/04/2020, 9:51 AM

## 2020-07-04 NOTE — Progress Notes (Signed)
Patient Transferred to CIR.  Report given to [facility]

## 2020-07-05 DIAGNOSIS — R5381 Other malaise: Secondary | ICD-10-CM | POA: Diagnosis not present

## 2020-07-05 LAB — OCCULT BLOOD X 1 CARD TO LAB, STOOL: Fecal Occult Bld: NEGATIVE

## 2020-07-05 MED ORDER — GENTAMICIN SULFATE 0.1 % EX CREA: 1.0000 "application " | TOPICAL_CREAM | Freq: Every day | CUTANEOUS | Status: DC

## 2020-07-05 MED ORDER — DELFLEX-LC/2.5% DEXTROSE 394 MOSM/L IP SOLN: INTRAPERITONEAL | Status: DC

## 2020-07-05 MED ORDER — HEPARIN 1000 UNIT/ML FOR PERITONEAL DIALYSIS
INTRAPERITONEAL | Status: DC | PRN
  Filled 2020-07-05: qty 5000

## 2020-07-05 MED ORDER — DELFLEX-LC/1.5% DEXTROSE 344 MOSM/L IP SOLN: INTRAPERITONEAL | Status: DC

## 2020-07-05 MED ORDER — HEPARIN 1000 UNIT/ML FOR PERITONEAL DIALYSIS
500.0000 [IU] | INTRAMUSCULAR | Status: DC | PRN
  Filled 2020-07-05: qty 0.5

## 2020-07-05 MED ORDER — HEPARIN 1000 UNIT/ML FOR PERITONEAL DIALYSIS: 500.0000 [IU] | INTRAMUSCULAR | Status: DC | PRN

## 2020-07-05 NOTE — Progress Notes (Signed)
PROGRESS NOTE   Subjective/Complaints: Patient tired from therapy but otherwise without complaints.  She states she voids continently.  No breathing issues no abdominal pain  Review of systems negative chest pain shortness of breath nausea vomiting diarrhea constipation   Objective:   No results found. No results for input(s): WBC, HGB, HCT, PLT in the last 72 hours. Recent Labs    07/03/20 0301  NA 131*  K 4.0  CL 98  CO2 21*  GLUCOSE 100*  BUN 27*  CREATININE 4.71*  CALCIUM 8.9    Intake/Output Summary (Last 24 hours) at 07/05/2020 1313 Last data filed at 07/05/2020 1309 Gross per 24 hour  Intake 490 ml  Output --  Net 490 ml        Physical Exam: Vital Signs Blood pressure 112/81, pulse 92, temperature (!) 97.3 F (36.3 C), temperature source Oral, resp. rate 16, height 5\' 4"  (1.626 m), weight 49.2 kg, SpO2 97 %.   General: No acute distress Mood and affect are appropriate Heart: Regular rate and rhythm no rubs murmurs or extra sounds Lungs: Clear to auscultation, breathing unlabored, no rales or wheezes Abdomen: Positive bowel sounds, soft nontender to palpation, nondistended Extremities: No clubbing, cyanosis, or edema Skin: No evidence of breakdown, no evidence of rash Neurologic: Cranial nerves II through XII intact, motor strength is 4/5 in bilateral deltoid, bicep, tricep, grip, hip flexor, knee extensors, ankle dorsiflexor and plantar flexor   Musculoskeletal: Full range of motion in all 4 extremities. No joint swelling  Assessment/Plan: 1. Functional deficits which require 3+ hours per day of interdisciplinary therapy in a comprehensive inpatient rehab setting.  Physiatrist is providing close team supervision and 24 hour management of active medical problems listed below.  Physiatrist and rehab team continue to assess barriers to discharge/monitor patient progress toward functional and medical  goals  Care Tool:  Bathing              Bathing assist       Upper Body Dressing/Undressing Upper body dressing        Upper body assist      Lower Body Dressing/Undressing Lower body dressing            Lower body assist       Toileting Toileting    Toileting assist Assist for toileting: Moderate Assistance - Patient 50 - 74%     Transfers Chair/bed transfer  Transfers assist           Locomotion Ambulation   Ambulation assist              Walk 10 feet activity   Assist           Walk 50 feet activity   Assist           Walk 150 feet activity   Assist           Walk 10 feet on uneven surface  activity   Assist           Wheelchair     Assist               Wheelchair 50 feet with 2 turns activity  Assist            Wheelchair 150 feet activity     Assist          Blood pressure 112/81, pulse 92, temperature (!) 97.3 F (36.3 C), temperature source Oral, resp. rate 16, height 5\' 4"  (1.626 m), weight 49.2 kg, SpO2 97 %.  Medical Problem List and Plan: 1.Functional and mobility deficitssecondary to debility after multiple medical issues-CHF, hx of C diff colitis, recent R hip fx 12/21 with ORIF -patient may shower -ELOS/Goals: 7-10 days, supervision with PT, OT Rehab eval's today patient with poor endurance 2. Antithrombotics: -DVT/anticoagulation:Mechanical:Antiembolism stockings, knee (TED hose) Bilateral lower extremities Sequential compression devices, below kneeBilateral lower extremities -antiplatelet therapy: ASA/plavix 3.Chronic daily frequent HA/Pain Management:tylenol for basic pain -fioricet for breakthrough headaches (on this PTA) 4. Mood:fairly positive. Team to provide ego support as needed, SW eval -antipsychotic agents: n/a -trazodone prn for sleep 5. Neuropsych: This  patientiscapable of making decisions on herown behalf. 6. Skin/Wound Care:Routine pressure relief measures. 7. Fluids/Electrolytes/Nutrition:Monitor I/O. Has low protein stores with alb-1.5 --Continue boost for supplement.  --Electrolyte abnormalities being addressed with PD. 8. ESRD: Continue daily PD, dialysis doing well at this point. --Hyponatremia stable around 130 9. CAD s/p PCI: On ASA/Plavix, metoprolol and lipitor. --Monitor for symptoms with increase in activity.  10. New onset seizures: On Keppra bid --off Wellbutrin. Monitor for anxiety symptoms. 11. H/o A flutter: Monitor HR tid--continue metoprolol. 12. Anemia of chronic disease: Monitor with serial H/H--improved post transfusion.  --On Aranesp weekly 13. C diff colitis: Completed treatment with Dificid on 02/27 -off contact precautions, no abdominal pain or diarrhea 14. Chronic combined CHF: Takatsubo CM with EF--25-30% -- Monitor weights daily.  --On demadex bid with Kdur for supplement as well as metoprolol and statin.    LOS: 1 days A FACE TO FACE EVALUATION WAS PERFORMED  Amy [name] 07/05/2020, 1:13 PM

## 2020-07-05 NOTE — Evaluation (Signed)
Occupational Therapy Assessment and Plan  Patient Details  Name: Amy [name] MRN: [id number] Date of Birth: Mar 21, 1951  OT Diagnosis: muscle weakness (generalized) and decreased activity tolerance impairing ADL/IADL and funcitonal mobility performance Rehab Potential: Rehab Potential (ACUTE ONLY): Good ELOS: 14 to 16 days   Today's Date: 07/05/2020 OT Individual Time: 4854-6270 OT Individual Time Calculation (min): 55 min     Hospital Problem: Principal Problem:   Debility   Past Medical History:  Past Medical History:  Diagnosis Date  . Anemia    a. 08/2016 Guaiac + stool. EGD w/ gastritis.  . Anxiety   . CKD (chronic kidney disease), stage III ([place])   . Closed fracture of shaft of left ulna 11/27/2018  . Colon polyp    a. 08/2016 Colonoscopy.  . Gastritis    a. 08/2016 EGD: Gastritis-->PPI.  [name] GERD (gastroesophageal reflux disease)   . History of chicken pox   . History of measles as a child   . Hypertension   . Hyponatremia    a. 08/2016 in setting of HCTZ Rx.  . Mesenteric lymphadenopathy 04/08/2017  . Multiple thyroid nodules    Past Surgical History:  Past Surgical History:  Procedure Laterality Date  . ABDOMINAL HYSTERECTOMY  2003   due to heavy periods and clotting during menses  . BREAST LUMPECTOMY Left 1990  . CHOLECYSTECTOMY  2010  . COLONOSCOPY WITH PROPOFOL N/A 09/07/2016   Procedure: COLONOSCOPY WITH PROPOFOL;  Surgeon: Lucilla Lame, MD;  Location: [facility] ENDOSCOPY;  Service: Endoscopy;  Laterality: N/A;  . CORONARY STENT INTERVENTION N/A 06/27/2020   Procedure: CORONARY STENT INTERVENTION;  Surgeon: Sherren Mocha, MD;  Location: [place] CV LAB;  Service: Cardiovascular;  Laterality: N/A;  . DIALYSIS/PERMA CATHETER INSERTION N/A 11/16/2018   Procedure: DIALYSIS/PERMA CATHETER INSERTION;  Surgeon: Algernon Huxley, MD;  Location: [place] [facility] LAB;  Service: Cardiovascular;  Laterality: N/A;  . DIALYSIS/PERMA CATHETER REMOVAL N/A 08/16/2019   Procedure:  DIALYSIS/PERMA CATHETER REMOVAL;  Surgeon: Algernon Huxley, MD;  Location: [place] [facility] LAB;  Service: Cardiovascular;  Laterality: N/A;  . DILATION AND CURETTAGE OF UTERUS  1985  . ESOPHAGOGASTRODUODENOSCOPY (EGD) WITH PROPOFOL N/A 09/07/2016   Procedure: ESOPHAGOGASTRODUODENOSCOPY (EGD) WITH PROPOFOL;  Surgeon: Lucilla Lame, MD;  Location: [facility] ENDOSCOPY;  Service: Endoscopy;  Laterality: N/A;  . GALLBLADDER SURGERY  2012  . INTRAMEDULLARY (IM) NAIL INTERTROCHANTERIC Right 04/23/2020   Procedure: INTRAMEDULLARY (IM) NAIL INTERTROCHANTRIC;  Surgeon: Thornton Park, MD;  Location: [facility] ORS;  Service: Orthopedics;  Laterality: Right;  . INTRAVASCULAR IMAGING/OCT N/A 06/27/2020   Procedure: INTRAVASCULAR IMAGING/OCT;  Surgeon: Sherren Mocha, MD;  Location: [place] [facility] LAB;  Service: Cardiovascular;  Laterality: N/A;  . KNEE SURGERY     Dr. Tamala Julian  . LEFT HEART CATH AND CORONARY ANGIOGRAPHY N/A 06/25/2020   Procedure: LEFT HEART CATH AND CORONARY ANGIOGRAPHY;  Surgeon: Troy Sine, MD;  Location: [place] CV LAB;  Service: Cardiovascular;  Laterality: N/A;  . TONSILLECTOMY    . TONSILLECTOMY AND ADENOIDECTOMY  1970  . UPPER GI ENDOSCOPY  03/02/2006   H. Pylori negative; Dr. Allen Norris    Assessment & Plan Clinical Impression: Patient is a 70 y.o. year old female with history of ESRD- on PD, HTN, gastritis, A flutter, anxiety d/o, R-hip fracture s/p ORIF 04/2020, recent admission for diarrhea (found to be Covid + also) due to C diff colitis -> d/c to home on oral Vanc but unable to get meds who was readmitted on 06/19/20 with mental status  changes due to brief seizure from multiple metabolic derangementsfromvolume depletion. EEG showed nonspecific cortical dysfunction in bifrontal region with moderate diffuse encephalopathy. She was loaded with keppra and found to have elevated cardiac enzymes due to NSTEMI due to demand ischemia. Dr. Jory Sims recommends keppra 500 mg bid for new onset seizure  with neuro follow up for adjustment or wean-->seizure provoked by multiple metabolic derangements and no driving X 6 months.   2D echo showed Takotsubo cardiomyopathy and combined systolic/diastolic. CHF being managed with PD and sinus tach managed with addition of BB. CTA chest negative for PE but showed coronary calcification. She underwent cardiac cath revealing severe multivessel disease and underwent PCI 02/25 as not felt to be CABG candidate. She developed RUE hematoma post procedure which was treated with elevation/compression and acute on chronic anemia transfused with one unit PRBC. Dr. Felizardo Hoffmann team following for input on meds which have been optimized and limited by BP. She continues to be limited by fatigue and weakness with decreased WB LLE, delayed processing with memory deficits affecting activity. Due to PD unable to go to SNF but is showing improvement in activity tolerance and CIR recommended due to functional decline.  Patient transferred to CIR on 07/04/2020 .    Patient currently requires min with basic self-care skills secondary to muscle weakness and decreased cardiorespiratoy endurance.  Prior to hospitalization, patient could complete BADL/IADL with modified independent .  Patient will benefit from skilled intervention to increase independence with basic self-care skills and increase level of independence with iADL prior to discharge home with care partner.  Anticipate patient will require intermittent supervision and follow up outpatient.  OT - End of Session Activity Tolerance: Tolerates 10 - 20 min activity with multiple rests Endurance Deficit: Yes Endurance Deficit Description: Req freq rest break with ADL, increased time to completed, declining to complete more 2/2 fatigue OT Assessment Rehab Potential (ACUTE ONLY): Good OT Barriers to Discharge: Decreased caregiver support OT Barriers to Discharge Comments: Will be home along majority of day. OT Patient demonstrates  impairments in the following area(s): Motor;Endurance;Skin Integrity;Balance OT Basic ADL's Functional Problem(s): Eating;Grooming;Bathing;Dressing;Toileting OT Advanced ADL's Functional Problem(s): Simple Meal Preparation OT Transfers Functional Problem(s): Tub/Shower;Toilet OT Additional Impairment(s): None OT Plan OT Intensity: Minimum of 1-2 x/day, 45 to 90 minutes OT Frequency: 5 out of 7 days OT Duration/Estimated Length of Stay: 14 to 16 days OT Treatment/Interventions: Balance/vestibular training;Neuromuscular re-education;Disease mangement/prevention;Self Care/advanced ADL retraining;Therapeutic Exercise;Wheelchair propulsion/positioning;Skin care/wound managment;UE/LE Strength taining/ROM;Pain management;DME/adaptive equipment instruction;Community reintegration;Patient/family education;UE/LE Coordination activities;Therapeutic Activities;Psychosocial support;Functional mobility training;Discharge planning OT Self Feeding Anticipated Outcome(s): ind OT Basic Self-Care Anticipated Outcome(s): mod I OT Toileting Anticipated Outcome(s): mod I OT Bathroom Transfers Anticipated Outcome(s): mod I OT Recommendation Recommendations for Other Services: Therapeutic Recreation consult Therapeutic Recreation Interventions: Pet therapy;Kitchen group;Stress management;Outing/community reintergration Patient destination: Home Follow Up Recommendations: Outpatient OT Equipment Recommended: None recommended by OT Equipment Details: Pt already owns 3in1, shower chair   OT Evaluation Precautions/Restrictions  Precautions Precautions: Fall Precaution Comments: generalized weakness, peritoneal diaylisis, seizures, monitor O2 during ambulation Restrictions Weight Bearing Restrictions: No General Chart Reviewed: Yes Family/Caregiver Present: No Vital Signs Therapy Vitals Temp: 98.3 F (36.8 C) Temp Source: Oral Pulse Rate: 91 Resp: 16 BP: 119/73 Patient Position (if appropriate):  Lying Oxygen Therapy SpO2: 100 % O2 Device: Room Air Pain   Denies pain Home Living/Prior Functioning Home Living Available Help at Discharge: Family,Available PRN/intermittently (husband and daughter who lives nearby work out of home during the day) Type of Home: House Home Access:  Stairs to enter [employer] of Steps: 2 Entrance Stairs-Rails: None Home Layout: Able to live on main level with bedroom/bathroom,Two level Bathroom Shower/Tub: Multimedia programmer: Standard  Lives With: Spouse IADL History Homemaking Responsibilities: Yes Meal Prep Responsibility: Secondary Laundry Responsibility: Secondary Cleaning Responsibility: Secondary Occupation: Retired,Part time employment 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, 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 Leisure and Hobbies: cooking, visiting with friends IADL Comments: not currently driving 2/2 seizures Prior Function Level of Independence: Requires assistive device for independence Driving: No Vision Baseline Vision/History: No visual deficits Patient Visual Report: No change from baseline Vision Assessment?: No apparent visual deficits Perception  Perception: Within Functional Limits Praxis Praxis: Intact Cognition Overall Cognitive Status: Within Functional Limits for tasks assessed Arousal/Alertness: Awake/alert Orientation Level: Person;Place;Situation Person: Oriented Place: Oriented Situation: Oriented Year: 2022 Month: March Day of Week: Correct Memory: Appears intact Immediate Memory Recall: Sock;Blue;Bed Memory Recall Sock: Without Cue Memory Recall Blue: Without Cue Memory Recall Bed: Without Cue Attention: Sustained Sustained Attention: Appears intact Awareness: Appears intact Problem Solving: Appears intact Safety/Judgment: Appears intact Comments: self-monitoring of fatigue level during func mobility and ADL Sensation Sensation Light Touch: Appears  Intact Hot/Cold: Appears Intact Proprioception: Appears Intact Stereognosis: Appears Intact Coordination Gross Motor Movements are Fluid and Coordinated: Yes Fine Motor Movements are Fluid and Coordinated: Yes Finger Nose Finger Test: WFL on B digits Motor  Motor Motor: Within Functional Limits Motor - Skilled Clinical Observations: motor control WFL, main deficits d/t generalized weakness  Trunk/Postural Assessment  Cervical Assessment Cervical Assessment: Exceptions to [facility] (forward head) Thoracic Assessment Thoracic Assessment: Exceptions to [facility] (rounded shoulders) Lumbar Assessment Lumbar Assessment: Exceptions to [facility] (posterior pelvic tilt) Postural Control Postural Control: Within Functional Limits  Balance Balance Balance Assessed: Yes Static Sitting Balance Static Sitting - Balance Support: Feet unsupported;Feet supported Static Sitting - Level of Assistance: 5: Stand by assistance Dynamic Sitting Balance Dynamic Sitting - Balance Support: Feet unsupported;Feet supported Dynamic Sitting - Level of Assistance: 5: Stand by assistance Dynamic Sitting - Balance Activities: Lateral lean/weight shifting;Forward lean/weight shifting;Reaching for objects;Reaching across midline Static Standing Balance Static Standing - Balance Support: Bilateral upper extremity supported;During functional activity Static Standing - Level of Assistance: 5: Stand by assistance Dynamic Standing Balance Dynamic Standing - Balance Support: Bilateral upper extremity supported Dynamic Standing - Level of Assistance: 4: Min assist Dynamic Standing - Balance Activities: Lateral lean/weight shifting;Forward lean/weight shifting;Reaching for objects;Reaching across midline Dynamic Standing - Comments: requires BUE support in order to perform dynamic stepping d/t generalized weakness Extremity/Trunk Assessment RUE Assessment RUE Assessment: Exceptions to [facility] Active Range of Motion (AROM) Comments: 3/4 to  full shoulder flexion General Strength Comments: 3+/5 grip strength, in shoulder flexion LUE Assessment LUE Assessment: Exceptions to [facility] Active Range of Motion (AROM) Comments: 3/4 to full shoulder flexion General Strength Comments: 3+/5 grip strength, in shoulder flexion  Care Tool Care Tool Self Care Eating   Eating Assist Level: Supervision/Verbal cueing (sitting unsupported)    Oral Care    Oral Care Assist Level: Supervision/Verbal cueing    Bathing   Body parts bathed by patient: Right arm;Left arm;Chest;Abdomen;Front perineal area;Right upper leg;Left upper leg;Face Body parts bathed by helper: Right lower leg;Left lower leg;Buttocks   Assist Level: Minimal Assistance - Patient > 75%    Upper Body Dressing(including orthotics)   What is the patient wearing?: Pull over shirt   Assist Level: Supervision/Verbal cueing    Lower Body Dressing (excluding footwear)   What is the patient wearing?: Pants;Incontinence  brief Assist for lower body dressing: Moderate Assistance - Patient 50 - 74%    Putting on/Taking off footwear   What is the patient wearing?: Non-skid slipper socks Assist for footwear: Supervision/Verbal cueing       Care Tool Toileting Toileting activity   Assist for toileting: Moderate Assistance - Patient 50 - 74%     Care Tool Bed Mobility Roll left and right activity   Roll left and right assist level: Supervision/Verbal cueing    Sit to lying activity   Sit to lying assist level: Contact Guard/Touching assist    Lying to sitting edge of bed activity   Lying to sitting edge of bed assist level: Minimal Assistance - Patient > 75%     Care Tool Transfers Sit to stand transfer   Sit to stand assist level: Minimal Assistance - Patient > 75%    Chair/bed transfer   Chair/bed transfer assist level: Minimal Assistance - Patient > 75%     Toilet transfer   Assist Level: Minimal Assistance - Patient > 75%     Care Tool Cognition Expression of  Ideas and Wants Expression of Ideas and Wants: Without difficulty (complex and basic) - expresses complex messages without difficulty and with speech that is clear and easy to understand   Understanding Verbal and Non-Verbal Content Understanding Verbal and Non-Verbal Content: Understands (complex and basic) - clear comprehension without cues or repetitions   Memory/Recall Ability *first 3 days only Memory/Recall Ability *first 3 days only: Location of own room;Current season;That he or she is in a hospital/hospital unit    Refer to Care Plan for [place] 1 OT Short Term Goal 1 (Week 1): Pt will complete toilet transfer with CGA from w/c. OT Short Term Goal 2 (Week 1): Pt will complete 2/3 tasks of toileting with close S. OT Short Term Goal 3 (Week 1): Pt will complete standing ADL for >5 min with CGA. OT Short Term Goal 4 (Week 1): Pt will don pants with overall min A.  Recommendations for other services: Therapeutic Recreation  Pet therapy, Kitchen group, Stress management and Outing/community reintegration   Skilled Therapeutic Intervention ADL ADL Eating: Supervision/safety Where Assessed-Eating: Edge of bed Grooming: Supervision/safety Where Assessed-Grooming: Sitting at sink Upper Body Bathing: Supervision/safety Where Assessed-Upper Body Bathing: Edge of bed;Sitting at sink Lower Body Bathing: Minimal assistance Where Assessed-Lower Body Bathing: Sitting at sink;Edge of bed Upper Body Dressing: Supervision/safety Where Assessed-Upper Body Dressing: Edge of bed;Sitting at sink Lower Body Dressing: Moderate assistance Where Assessed-Lower Body Dressing: Edge of bed;Sitting at sink Toileting: Moderate assistance Where Assessed-Toileting: Glass blower/designer: Psychiatric nurse Method: Arts development officer: Radiographer, therapeutic: Minimal Museum/gallery conservator Method: Stand  pivot Tub/Shower Equipment: Shower seat with back;Grab bars;Walk in shower Mobility  Bed Mobility Bed Mobility: Rolling Right;Rolling Left;Right Sidelying to Sit;Supine to Sit;Sit to Supine Rolling Right: Supervision/verbal cueing Rolling Left: Supervision/Verbal cueing Right Sidelying to Sit: Contact Guard/Touching assist Supine to Sit: Minimal Assistance - Patient > 75% Sit to Supine: Contact Guard/Touching assist Transfers Sit to Stand: Minimal Assistance - Patient > 75% Stand to Sit: Minimal Assistance - Patient > 75%  Session Note: Pt received semi-reclined in bed with NT present, pt agreeable to OT eval. Reviewed role of CIR OT, evaluation process, ADL/func mobility retraining, goals for therapy, and safety plan. Evaluation completed as documented above with session focus on simulated dressing/bathing, bed mobility, functional transfers and seated grooming with above  assist levels. Pt declining sponge bath as she had previously gotten cleaned up and changed after incont episode earlier in the day. Discussed and pt demonstrated dressing A levels. Pt stating she feels her primary barrier to return to PLOF is her extreme fatigue. Supine > sitting EOB with close S, req intermittent CGA 2/2 unsupported BLE. STS with min A for initial lift and stand-pivot with RW to w/c with min A 2/2 generalized weakness. Completed seated grooming at sink with close S. Pt demonstrating ability to doff/don B socks with close S. Returned to bed same manner as above, min A to return to supine to adjust BLE in bed. Additionally reviewed pt rehab potential and CLOF as pt is anxious to return to PLOF.  Pt left semi-reclined in bed with call bell in reach, all immediate needs met, and bed alarm engaged.    Discharge Criteria: Patient will be discharged from OT if patient refuses treatment 3 consecutive times without medical reason, if treatment goals not met, if there is a change in medical status, if patient makes no  progress towards goals or if patient is discharged from hospital.  The above assessment, treatment plan, treatment alternatives and goals were discussed and mutually agreed upon: by patient  Volanda Napoleon MS, OTR/L  07/05/2020, 3:23 PM

## 2020-07-05 NOTE — Plan of Care (Signed)
  Problem: RH Balance Goal: LTG: Patient will maintain dynamic sitting balance (OT) Description: LTG:  Patient will maintain dynamic sitting balance with assistance during activities of daily living (OT) Flowsheets (Taken 07/05/2020 1254) LTG: Pt will maintain dynamic sitting balance during ADLs with: Independent with assistive device Goal: LTG Patient will maintain dynamic standing with ADLs (OT) Description: LTG:  Patient will maintain dynamic standing balance with assist during activities of daily living (OT)  Flowsheets (Taken 07/05/2020 1254) LTG: Pt will maintain dynamic standing balance during ADLs with: Independent with assistive device   Problem: Sit to Stand Goal: LTG:  Patient will perform sit to stand in prep for activites of daily living with assistance level (OT) Description: LTG:  Patient will perform sit to stand in prep for activites of daily living with assistance level (OT) Flowsheets (Taken 07/05/2020 1254) LTG: PT will perform sit to stand in prep for activites of daily living with assistance level: Independent with assistive device   Problem: RH Eating Goal: LTG Patient will perform eating w/assist, cues/equip (OT) Description: LTG: Patient will perform eating with assist, with/without cues using equipment (OT) Flowsheets (Taken 07/05/2020 1254) LTG: Pt will perform eating with assistance level of: Independent   Problem: RH Grooming Goal: LTG Patient will perform grooming w/assist,cues/equip (OT) Description: LTG: Patient will perform grooming with assist, with/without cues using equipment (OT) Flowsheets (Taken 07/05/2020 1254) LTG: Pt will perform grooming with assistance level of: Independent with assistive device    Problem: RH Bathing Goal: LTG Patient will bathe all body parts with assist levels (OT) Description: LTG: Patient will bathe all body parts with assist levels (OT) Flowsheets (Taken 07/05/2020 1254) LTG: Pt will perform bathing with assistance level/cueing:  Supervision/Verbal cueing   Problem: RH Dressing Goal: LTG Patient will perform upper body dressing (OT) Description: LTG Patient will perform upper body dressing with assist, with/without cues (OT). Flowsheets (Taken 07/05/2020 1254) LTG: Pt will perform upper body dressing with assistance level of: Independent with assistive device Goal: LTG Patient will perform lower body dressing w/assist (OT) Description: LTG: Patient will perform lower body dressing with assist, with/without cues in positioning using equipment (OT) Flowsheets (Taken 07/05/2020 1254) LTG: Pt will perform lower body dressing with assistance level of: Independent with assistive device   Problem: RH Simple Meal Prep Goal: LTG Patient will perform simple meal prep w/assist (OT) Description: LTG: Patient will perform simple meal prep with assistance, with/without cues (OT). Flowsheets (Taken 07/05/2020 1254) LTG: Pt will perform simple meal prep with assistance level of: Independent with assistive device   Problem: RH Toilet Transfers Goal: LTG Patient will perform toilet transfers w/assist (OT) Description: LTG: Patient will perform toilet transfers with assist, with/without cues using equipment (OT) Flowsheets (Taken 07/05/2020 1254) LTG: Pt will perform toilet transfers with assistance level of: Independent with assistive device   Problem: RH Tub/Shower Transfers Goal: LTG Patient will perform tub/shower transfers w/assist (OT) Description: LTG: Patient will perform tub/shower transfers with assist, with/without cues using equipment (OT) Flowsheets (Taken 07/05/2020 1254) LTG: Pt will perform tub/shower stall transfers with assistance level of: Supervision/Verbal cueing

## 2020-07-05 NOTE — Progress Notes (Signed)
notifed by infectious disease that enteric precautions can be discontinued

## 2020-07-05 NOTE — Progress Notes (Signed)
Physical Therapy Assessment and Plan  Patient Details  Name: Amy [name] MRN: [id number] Date of Birth: 03-05-1951  PT Diagnosis: Abnormal posture, Cognitive deficits, Difficulty walking, Dizziness and giddiness, Low back pain and Muscle weakness Rehab Potential: Good ELOS: 12-14 days   Today's Date: 07/05/2020 PT Individual Time: 0800-0900 PT Individual Time Calculation (min): 60 min    Hospital Problem: Principal Problem:   Debility   Past Medical History:  Past Medical History:  Diagnosis Date  . Anemia    a. 08/2016 Guaiac + stool. EGD w/ gastritis.  . Anxiety   . CKD (chronic kidney disease), stage III ([place])   . Closed fracture of shaft of left ulna 11/27/2018  . Colon polyp    a. 08/2016 Colonoscopy.  . Gastritis    a. 08/2016 EGD: Gastritis-->PPI.  [name] GERD (gastroesophageal reflux disease)   . History of chicken pox   . History of measles as a child   . Hypertension   . Hyponatremia    a. 08/2016 in setting of HCTZ Rx.  . Mesenteric lymphadenopathy 04/08/2017  . Multiple thyroid nodules    Past Surgical History:  Past Surgical History:  Procedure Laterality Date  . ABDOMINAL HYSTERECTOMY  2003   due to heavy periods and clotting during menses  . BREAST LUMPECTOMY Left 1990  . CHOLECYSTECTOMY  2010  . COLONOSCOPY WITH PROPOFOL N/A 09/07/2016   Procedure: COLONOSCOPY WITH PROPOFOL;  Surgeon: Lucilla Lame, MD;  Location: [facility] ENDOSCOPY;  Service: Endoscopy;  Laterality: N/A;  . CORONARY STENT INTERVENTION N/A 06/27/2020   Procedure: CORONARY STENT INTERVENTION;  Surgeon: Sherren Mocha, MD;  Location: [place] CV LAB;  Service: Cardiovascular;  Laterality: N/A;  . DIALYSIS/PERMA CATHETER INSERTION N/A 11/16/2018   Procedure: DIALYSIS/PERMA CATHETER INSERTION;  Surgeon: Algernon Huxley, MD;  Location: [place] CV LAB;  Service: Cardiovascular;  Laterality: N/A;  . DIALYSIS/PERMA CATHETER REMOVAL N/A 08/16/2019   Procedure: DIALYSIS/PERMA CATHETER REMOVAL;  Surgeon:  Algernon Huxley, MD;  Location: [place] CV LAB;  Service: Cardiovascular;  Laterality: N/A;  . DILATION AND CURETTAGE OF UTERUS  1985  . ESOPHAGOGASTRODUODENOSCOPY (EGD) WITH PROPOFOL N/A 09/07/2016   Procedure: ESOPHAGOGASTRODUODENOSCOPY (EGD) WITH PROPOFOL;  Surgeon: Lucilla Lame, MD;  Location: [facility] ENDOSCOPY;  Service: Endoscopy;  Laterality: N/A;  . GALLBLADDER SURGERY  2012  . INTRAMEDULLARY (IM) NAIL INTERTROCHANTERIC Right 04/23/2020   Procedure: INTRAMEDULLARY (IM) NAIL INTERTROCHANTRIC;  Surgeon: Thornton Park, MD;  Location: [facility] ORS;  Service: Orthopedics;  Laterality: Right;  . INTRAVASCULAR IMAGING/OCT N/A 06/27/2020   Procedure: INTRAVASCULAR IMAGING/OCT;  Surgeon: Sherren Mocha, MD;  Location: [place] CV LAB;  Service: Cardiovascular;  Laterality: N/A;  . KNEE SURGERY     Dr. Tamala Julian  . LEFT HEART CATH AND CORONARY ANGIOGRAPHY N/A 06/25/2020   Procedure: LEFT HEART CATH AND CORONARY ANGIOGRAPHY;  Surgeon: Troy Sine, MD;  Location: [place] CV LAB;  Service: Cardiovascular;  Laterality: N/A;  . TONSILLECTOMY    . TONSILLECTOMY AND ADENOIDECTOMY  1970  . UPPER GI ENDOSCOPY  03/02/2006   H. Pylori negative; Dr. Allen Norris    Assessment & Plan Clinical Impression: Patient is a 70 y.o. female with history of ESRD- on PD, HTN, gastritis, A flutter, anxiety d/o, R-hip fracture s/p ORIF 04/2020, recent admission for diarrhea (found to be Covid + also) due to C diff colitis -> d/c to home on oral Vanc but unable to get meds who was readmitted on 06/19/20 with mental status changes due to brief seizure from multiple  metabolic derangementsfromvolume depletion. EEG showed nonspecific cortical dysfunction in bifrontal region with moderate diffuse encephalopathy. She was loaded with keppra and found to have elevated cardiac enzymes due to NSTEMI due to demand ischemia. Dr. Jory Sims recommends keppra 500 mg bid for new onset seizure with neuro follow up for adjustment or wean-->seizure  provoked by multiple metabolic derangements and no driving X 6 months.   2D echo showed Takotsubo cardiomyopathy and combined systolic/diastolic. CHF being managed with PD and sinus tach managed with addition of BB. CTA chest negative for PE but showed coronary calcification. She underwent cardiac cath revealing severe multivessel disease and underwent PCI 02/25 as not felt to be CABG candidate. She developed RUE hematoma post procedure which was treated with elevation/compression and acute on chronic anemia transfused with one unit PRBC. Dr. Felizardo Hoffmann team following for input on meds which have been optimized and limited by BP. She continues to be limited by fatigue and weakness with decreased WB LLE, delayed processing with memory deficits affecting activity. Due to PD unable to go to SNF but is showing improvement in activity tolerance.  Patient transferred to CIR on 07/04/2020 .   Patient currently requires mod with mobility secondary to muscle weakness, decreased cardiorespiratoy endurance, unbalanced muscle activation and decreased motor planning, ,, ,, decreased problem solving and delayed processing and decreased standing balance and decreased balance strategies.  Prior to hospitalization, patient was independent  with mobility and lived with   in a   home.  Home access is   .  Patient will benefit from skilled PT intervention to maximize safe functional mobility for planned discharge home with intermittent assist.  Anticipate patient will benefit from follow up [facility] at discharge.  PT - End of Session Activity Tolerance: Tolerates 30+ min activity with multiple rests Endurance Deficit: Yes PT Assessment Rehab Potential (ACUTE/IP ONLY): Good PT Barriers to Discharge: [place] home environment;Decreased caregiver support;Home environment access/layout;Incontinence;Lack of/limited family support;Wound Care;Hemodialysis PT Patient demonstrates impairments in the following area(s):  Balance;Endurance;Motor;Pain;Safety PT Transfers Functional Problem(s): Bed Mobility;Bed to Chair;Car;Furniture PT Locomotion Functional Problem(s): Ambulation;Wheelchair Mobility;Stairs PT Plan PT Intensity: Minimum of 1-2 x/day ,45 to 90 minutes PT Frequency: 5 out of 7 days PT Duration Estimated Length of Stay: 12-14 days PT Treatment/Interventions: Ambulation/gait training;Balance/vestibular training;Community reintegration;Discharge planning;Disease management/prevention;DME/adaptive equipment instruction;Functional mobility training;Neuromuscular re-education;Pain management;Patient/family education;Psychosocial support;Skin care/wound management;Stair training;Therapeutic Activities;Therapeutic Exercise;UE/LE Strength taining/ROM;UE/LE Coordination activities;Visual/perceptual remediation/compensation PT Transfers Anticipated Outcome(s): supervision for all functional transfers (may need to be Mod I if she will not have daily supervision on d/c) PT Recommendation Recommendations for Other Services: Therapeutic Recreation consult Therapeutic Recreation Interventions: Pet therapy;Kitchen group;Stress management;Outing/community reintergration Follow Up Recommendations: Home health PT Patient destination: Home Equipment Recommended: To be determined   PT Evaluation Precautions/Restrictions Precautions Precautions: Fall Precaution Comments: generalized weakness, peritoneal diaylisis Restrictions Weight Bearing Restrictions: No General Chart Reviewed: Yes Family/Caregiver Present: No Vital Signs Therapy Vitals Temp: 98.3 F (36.8 C) Temp Source: Oral Pulse Rate: 91 Resp: 16 BP: 112/75 Patient Position (if appropriate): Seated Oxygen Therapy SpO2: 100 % O2 Device: Room Air Pain Pain Assessment Pain Scale: 0-10 Pain Score: 7  Pain Type: Chronic pain Pain Location: Head Pain Orientation: Anterior Pain Descriptors / Indicators: Aching;Headache Pain Frequency:  Intermittent Pain Onset: Gradual Patients Stated Pain Goal: 0 Pain Intervention(s): Medication (See eMAR) Home Living/Prior Functioning Home Living Available Help at Discharge: Family,Available PRN/intermittently (husband works out of town daily and daughter lives nearby who works outside of home during the day) Type of Home: House Home Access:  Stairs to enter [employer] of Steps: 2 Entrance Stairs-Rails: None Home Layout: Able to live on main level with bedroom/bathroom,Two level Bathroom Shower/Tub: Multimedia programmer: Standard  Lives With: Spouse Occupation: Retired,Part time employment 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, 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 Prior Function Level of Independence: [place] for independence Driving: No; not currently driving 2/2 seizures Vision/Perception  Perception Perception: Within [place]: Intact  Cognition Overall Cognitive Status: Within Functional Limits for tasks assessed Arousal/Alertness: Awake/alert Orientation Level: Oriented to person;Oriented to place;Oriented to situation Memory: Appears intact Awareness: Appears intact Problem Solving: Appears intact Safety/Judgment: Appears intact Comments: tending to lines Sensation Sensation Light Touch: Appears Intact Coordination Gross Motor Movements are Fluid and Coordinated: Yes Fine Motor Movements are Fluid and Coordinated: Yes Motor  Motor Motor: Within Functional Limits Motor - Skilled Clinical Observations: motor control WFL, main deficits d/t generalized weakness   Trunk/Postural Assessment  Cervical Assessment Cervical Assessment: Exceptions to [facility] (forward head) Lumbar Assessment Lumbar Assessment:  (posterior pelvic tilt) Postural Control Postural Control: Within Functional Limits  Balance Balance Balance Assessed: Yes Static Sitting Balance Static Sitting - Balance Support:  Feet supported;No upper extremity supported;Bilateral upper extremity supported (with feet unsupported, pt with minor LOB backward, with feet supported, no LOB noted.) Static Sitting - Level of Assistance: 5: Stand by assistance Dynamic Sitting Balance Dynamic Sitting - Balance Support: Right upper extremity supported;Left upper extremity supported;During functional activity;Feet supported Dynamic Sitting - Level of Assistance: 5: Stand by assistance Dynamic Sitting - Balance Activities: Lateral lean/weight shifting;Forward lean/weight shifting;Reaching for objects;Reaching across midline Static Standing Balance Static Standing - Balance Support: Bilateral upper extremity supported;During functional activity Static Standing - Level of Assistance: 5: Stand by assistance Dynamic Standing Balance Dynamic Standing - Balance Support: Bilateral upper extremity supported Dynamic Standing - Level of Assistance: 4: Min assist Dynamic Standing - Balance Activities: Lateral lean/weight shifting;Forward lean/weight shifting;Reaching for objects;Reaching across midline Dynamic Standing - Comments: requires BUE support in order to perform dynamic stepping d/t generalized weakness Extremity Assessment      RLE Assessment RLE Assessment: Exceptions to [facility] RLE Strength RLE Overall Strength: Deficits Right Hip Flexion: 3-/5 Right Hip Extension: 3+/5 Right Hip ABduction: 4-/5 Right Hip ADduction: 4-/5 Right Knee Flexion: 4-/5 Right Knee Extension: 4-/5 Right Ankle Dorsiflexion: 3+/5 Right Ankle Plantar Flexion: 3+/5 LLE Assessment LLE Assessment: Exceptions to WFL LLE Strength LLE Overall Strength: Deficits Left Hip Flexion: 3/5 Left Hip Extension: 3+/5 Left Hip ABduction: 3+/5 Left Hip ADduction: 4-/5 Left Knee Flexion: 4-/5 Left Knee Extension: 4-/5 Left Ankle Dorsiflexion: 3+/5 Left Ankle Plantar Flexion: 4-/5  Care Tool Care Tool Bed Mobility Roll left and right activity   Roll left and  right assist level: Supervision/Verbal cueing    Sit to lying activity   Sit to lying assist level: Contact Guard/Touching assist    Lying to sitting edge of bed activity   Lying to sitting edge of bed assist level: Minimal Assistance - Patient > 75%     Care Tool Transfers Sit to stand transfer   Sit to stand assist level: Minimal Assistance - Patient > 75%    Chair/bed transfer   Chair/bed transfer assist level: Minimal Assistance - Patient > 75%     Toilet transfer   Assist Level: Minimal Assistance - Patient > 75%    Car transfer Car transfer activity did not occur: Safety/medical concerns        Care Tool Locomotion Ambulation  Assist level: Minimal Assistance - Patient > 75% Assistive device: Walker-rolling Max distance: 8 ft  Walk 10 feet activity Walk 10 feet activity did not occur: Safety/medical concerns       Walk 50 feet with 2 turns activity Walk 50 feet with 2 turns activity did not occur: Safety/medical concerns      Walk 150 feet activity Walk 150 feet activity did not occur: Safety/medical concerns      Walk 10 feet on uneven surfaces activity Walk 10 feet on uneven surfaces activity did not occur: Safety/medical concerns      Stairs Stair activity did not occur: Safety/medical concerns        Walk up/down 1 step activity Walk up/down 1 step or curb (drop down) activity did not occur: Safety/medical concerns     Walk up/down 4 steps activity did not occuR: Safety/medical concerns  Walk up/down 4 steps activity      Walk up/down 12 steps activity Walk up/down 12 steps activity did not occur: Safety/medical concerns      Pick up small objects from floor Pick up small object from the floor (from standing position) activity did not occur: Safety/medical concerns      Wheelchair Will patient use wheelchair at discharge?: Yes Type of Wheelchair: Manual Wheelchair activity did not occur: Safety/medical concerns      Wheel 50 feet with 2 turns  activity Wheelchair 50 feet with 2 turns activity did not occur: Safety/medical concerns    Wheel 150 feet activity Wheelchair 150 feet activity did not occur: Safety/medical concerns      Refer to Care Plan for Long Term Goals  SHORT TERM GOAL WEEK 1 PT Short Term Goal 1 (Week 1): Pt will perform bed mobility with supervision. PT Short Term Goal 2 (Week 1): Pt will perform STS and SPVT transfers with CGA. PT Short Term Goal 3 (Week 1): Pt will ambulate at least 35 feet using RW with CGA PT Short Term Goal 4 (Week 1): Pt will perform 1 step using RW with Min A.  Recommendations for other services: Therapeutic Recreation  Pet therapy, Kitchen group, Stress management and Outing/community reintegration  Skilled Therapeutic Intervention Mobility Bed Mobility Bed Mobility: Rolling Right;Rolling Left;Right Sidelying to Sit;Supine to Sit;Sit to Supine Rolling Right: Supervision/verbal cueing Rolling Left: Supervision/Verbal cueing Right Sidelying to Sit: Contact Guard/Touching assist Supine to Sit: Minimal Assistance - Patient > 75% Sit to Supine: Contact Guard/Touching assist Transfers Transfers: Sit to Stand;Stand Pivot Transfers;Stand to Sit Sit to Stand: Contact Guard/Touching assist;Minimal Assistance - Patient > 75% Stand to Sit: Minimal Assistance - Patient > 75% Stand Pivot Transfers: Minimal Assistance - Patient > 75% Stand Pivot Transfer Details: Verbal cues for technique;Verbal cues for gait pattern;Verbal cues for safe use of DME/AE Transfer (Assistive device): Rolling walker Locomotion  Gait Ambulation: Yes Gait Assistance: Minimal Assistance - Patient > 75% Gait Distance (Feet): 8 Feet Assistive device: Rolling walker Gait Assistance Details: Verbal cues for technique;Verbal cues for precautions/safety;Verbal cues for safe use of DME/AE Gait Assistance Details: Min A provided at hips for stabilization Gait Gait: Yes Gait Pattern: Impaired Gait Pattern: Step-to  pattern;Decreased step length - right;Decreased step length - left;Decreased hip/knee flexion - right;Decreased hip/knee flexion - left;Trunk flexed;Wide base of support Stairs / Additional Locomotion Stairs: No Wheelchair Mobility Wheelchair Mobility: No  Session Note: PT Evaluation completed; see above for results. PT educated patient in roles of PT vs OT, PT POC, rehab potential, rehab goals, and discharge recommendations along with recommendation for  follow-up rehabilitation services. Individual treatment initiated:  Patient supine and semi reclined in bed upon PT arrival. Patient alert and agreeable to PT session, however PD has completed and remained connected. RN notified and dialysis RN arrives at end of session to disconnect. Overall mobility is limited to within room during evaluation.  Patient denied c/o headache pain at beginning of session and receives her headache medication during session.  Therapeutic Activity: Bed Mobility: Pt requires extra time to complete but is able to perform supine --> sit with Min A. Return to supine at end of session completed with CGA. VC throughout for technique. Transfers: Patient performed STS from elevated bed surface to RW with CGA. STS and SPVT w/c <> RW throughout rest of session from lower height performed with Min A initially and improves to CGA by end of session. Provided verbal cues for technique in push-to-stand. Pt's main barrier is generalized weakness/fatigue.  Sitting and standing balance/ tolerance challenged with toileting and dressing tasks as pt has an episode of loose bowels during initial stand from bedside. Seated balance is good with ability to reach outside of BOS and across midline to reach for articles of clothing with BLE supported on floor. Pt able to tolerate standing for 1.50mnutes prior to need to sit. Standing requires BUE support to maintain balance without physical assist. Max A for posterior pericare and supervision for  anterior pericare. Requires Max A to donn brief and to thread LE into pants. Is able to pull pants up to highest point while sitting, then Max A to complete LB in standing in order to maintain balance.  Gait Training:  Patient ambulated 5 feet using RW and Min A for balance in order to complete ambulatory pivoting back to bedside. Provided verbal cues for step progression, increasing step height/ length, upright posture.  Patient supine in bed at end of session with brakes locked, dialysis team RN in room assisting with detaching dialysis. Pt left with call bell at her side and tray table with other needs within reach. Pt states someone had plugged a cellphone in for her overnight, however it could not be located. NT and charge nurse informed re: cellphone.    Discharge Criteria: Patient will be discharged from PT if patient refuses treatment 3 consecutive times without medical reason, if treatment goals not met, if there is a change in medical status, if patient makes no progress towards goals or if patient is discharged from hospital.  The above assessment, treatment plan, treatment alternatives and goals were discussed and mutually agreed upon: by patient  JAlger Simons3/08/2020, 1:49 PM

## 2020-07-05 NOTE — Progress Notes (Signed)
Inpatient Rehabilitation  Patient information reviewed and entered into eRehab system by Marton Malizia M. Alyx Gee, M.A., CCC/SLP, PPS Coordinator.  Information including medical coding, functional ability and quality indicators will be reviewed and updated through discharge.    

## 2020-07-05 NOTE — Progress Notes (Signed)
Patient ID: Amy [name], female   DOB: Sep 23, 1950, 70 y.o.   MRN: [id number] S: Was a little lightheaded with PT yesterday and feels tired today. O:BP 112/81 (BP Location: Right Arm)   Pulse 92   Temp (!) 97.3 F (36.3 C) (Oral)   Resp 16   Ht 5\' 4"  (1.626 m)   Wt 49.2 kg   SpO2 97%   BMI 18.62 kg/m   Intake/Output Summary (Last 24 hours) at 07/05/2020 1200 Last data filed at 07/05/2020 0830 Gross per 24 hour  Intake 250 ml  Output --  Net 250 ml   Intake/Output: I/O last 3 completed shifts: In: 120 [P.O.:120] Out: -   Intake/Output this shift:  Total I/O In: 130 [P.O.:120; I.V.:10] Out: -  Weight change:  Gen: NAD CVS: RRR Resp: cta Abd: benign Ext: 1+ presacral and bilateral pretibial edema  Recent Labs  Lab 06/29/20 0111 06/30/20 0046 07/02/20 0052 07/03/20 0301  NA 131* 131* 132* 131*  K 3.7 4.0 3.4* 4.0  CL 97* 99 99 98  CO2 23 21* 18* 21*  GLUCOSE 100* 114* 90 100*  BUN 29* 30* 28* 27*  CREATININE 4.65* 4.75* 4.68* 4.71*  ALBUMIN 1.4* 1.4*  --  1.5*  CALCIUM 8.3* 8.2* 8.7* 8.9  PHOS 3.9 3.4  --  3.0   Liver Function Tests: Recent Labs  Lab 06/29/20 0111 06/30/20 0046 07/03/20 0301  ALBUMIN 1.4* 1.4* 1.5*   No results for input(s): LIPASE, AMYLASE in the last 168 hours. Recent Labs  Lab 06/29/20 1042  AMMONIA 74*   CBC: Recent Labs  Lab 06/29/20 0111 06/29/20 1042 06/30/20 0046 07/02/20 0052  WBC 7.3  --  10.4 8.0  HGB 6.7* 11.0* 9.8* 9.3*  HCT 19.1* 33.1* 29.4* 27.3*  MCV 89.7  --  89.9 91.3  PLT 238  --  227 243   Cardiac Enzymes: No results for input(s): CKTOTAL, CKMB, CKMBINDEX, TROPONINI in the last 168 hours. CBG: No results for input(s): GLUCAP in the last 168 hours.  Iron Studies: No results for input(s): IRON, TIBC, TRANSFERRIN, FERRITIN in the last 72 hours. Studies/Results: No results found. [name] aspirin  81 mg Oral Daily  . atorvastatin  80 mg Oral q1800  . Chlorhexidine Gluconate Cloth  6 each Topical Daily  .  cinacalcet  30 mg Oral Q breakfast  . clopidogrel  75 mg Oral Daily  . darbepoetin (ARANESP) injection - NON-DIALYSIS  100 mcg Subcutaneous Q Sat-1800  . feeding supplement  1 Container Oral TID BM  . gentamicin cream  1 application Topical Daily  . levETIRAcetam  500 mg Oral BID  . metoprolol succinate  25 mg Oral Daily  . multivitamin  1 tablet Oral QHS  . pantoprazole  40 mg Oral Daily  . potassium chloride  10 mEq Oral Daily  . sodium chloride flush  3 mL Intravenous Q12H  . torsemide  100 mg Oral BID    BMET    Component Value Date/Time   NA 131 (L) 07/03/2020 0301   NA 135 11/08/2018 1620   K 4.0 07/03/2020 0301   CL 98 07/03/2020 0301   CO2 21 (L) 07/03/2020 0301   GLUCOSE 100 (H) 07/03/2020 0301   BUN 27 (H) 07/03/2020 0301   BUN 77 (HH) 11/08/2018 1620   CREATININE 4.71 (H) 07/03/2020 0301   CREATININE 1.93 (H) 04/05/2017 1451   CALCIUM 8.9 07/03/2020 0301   GFRNONAA 9 (L) 07/03/2020 0301   GFRAA 7 (L) 12/17/2019 8242  CBC    Component Value Date/Time   WBC 8.0 07/02/2020 0052   RBC 2.99 (L) 07/02/2020 0052   HGB 9.3 (L) 07/02/2020 0052   HGB 9.2 (L) 11/08/2018 1620   HCT 27.3 (L) 07/02/2020 0052   HCT 28.5 (L) 11/08/2018 1620   PLT 243 07/02/2020 0052   PLT 235 11/08/2018 1620   MCV 91.3 07/02/2020 0052   MCV 88 11/08/2018 1620   MCH 31.1 07/02/2020 0052   MCHC 34.1 07/02/2020 0052   RDW 17.9 (H) 07/02/2020 0052   RDW 16.1 (H) 11/08/2018 1620   LYMPHSABS 1.2 06/27/2020 0026   LYMPHSABS 0.8 11/08/2018 1620   MONOABS 0.8 06/27/2020 0026   EOSABS 0.2 06/27/2020 0026   EOSABS 0.4 11/08/2018 1620   BASOSABS 0.1 06/27/2020 0026   BASOSABS 0.1 11/08/2018 1620    Dialysis Orders: [place] followed by Dr. Candiss Norse  Assessment/Plan: 1. Multivessel CAD with reduced EF 25/30%: HF team following.Eval by TCTS"poor CABG candidate" status post PCI to LAD with DESon 2/25.on Plavix and ASA 81 mg, high-dose statin 2. ESRD -stable on  PD, also on torsemide 100 mg daily with some results per patient minimal documentation I/Os.last K improved to 4.0.Continue KCl 49meqPOBID. 3. HTN/volume -BPin goal,on metoprolol 25 mg daily and okay to start losartan 25 mg as outpatient per cardiology recommendation. Continue torsemide and PD to reduce volume. Per patient improved.  Would use all 2.5% to help with UF but she was dizzy yesterday so will continue with 1/2 1.5% and 2.5% 4. Anemia -Hgb^9.3 s/p 1unitPRBCson 2/20 and again 2/27. Continue Aranesp 100 MCG q Saturday. tsat 91%,ferritin 1131. No need for iron at this time. 5. Secondary hyperparathyroidismno binder listed, on Sensipar 30,stoppedRocaltrol with elevatedcorrectedcalcium>10 6. Hyponatremia - chronic and may need to use all 2.5% bags tomorrow is remains low.  7. Nutrition: Currently on dysphagia 3 diet.Albumin low at 1.4, noted on Ensure 3 times daily,follow labs and change to renal diet if K or phos start to trend up.  8. Seizures - do to electrolyte abnormality in setting of C diff. Now on Keppra with neruo f/u. 9. C diff - finished ABX course on 2/27. 10. Disposition-CIR admit for rehab   Donetta Potts, MD [employer] [phone]

## 2020-07-06 DIAGNOSIS — R5381 Other malaise: Secondary | ICD-10-CM | POA: Diagnosis not present

## 2020-07-06 MED ORDER — ZOLPIDEM TARTRATE 5 MG PO TABS
5.0000 mg | ORAL_TABLET | Freq: Every evening | ORAL | Status: DC | PRN
  Administered 2020-07-06 – 2020-07-23 (×18): 5 mg via ORAL
  Filled 2020-07-06 (×20): qty 1

## 2020-07-06 NOTE — Plan of Care (Signed)
  Problem: RH Balance Goal: LTG Patient will maintain dynamic sitting balance (PT) Description: LTG:  Patient will maintain dynamic sitting balance with assistance during mobility activities (PT) Flowsheets (Taken 07/05/2020 1307) LTG: Pt will maintain dynamic sitting balance during mobility activities with:: Independent with assistive device  Goal: LTG Patient will maintain dynamic standing balance (PT) Description: LTG:  Patient will maintain dynamic standing balance with assistance during mobility activities (PT) Flowsheets (Taken 07/05/2020 1307) LTG: Pt will maintain dynamic standing balance during mobility activities with:: Supervision/Verbal cueing   Problem: Sit to Stand Goal: LTG:  Patient will perform sit to stand with assistance level (PT) Description: LTG:  Patient will perform sit to stand with assistance level (PT) Flowsheets (Taken 07/05/2020 1307) LTG: PT will perform sit to stand in preparation for functional mobility with assistance level: Supervision/Verbal cueing   Problem: RH Bed Mobility Goal: LTG Patient will perform bed mobility with assist (PT) Description: LTG: Patient will perform bed mobility with assistance, with/without cues (PT). Flowsheets (Taken 07/05/2020 1307) LTG: Pt will perform bed mobility with assistance level of: Independent with assistive device    Problem: RH Bed to Chair Transfers Goal: LTG Patient will perform bed/chair transfers w/assist (PT) Description: LTG: Patient will perform bed to chair transfers with assistance (PT). Flowsheets (Taken 07/05/2020 1307) LTG: Pt will perform Bed to Chair Transfers with assistance level: Supervision/Verbal cueing   Problem: RH Car Transfers Goal: LTG Patient will perform car transfers with assist (PT) Description: LTG: Patient will perform car transfers with assistance (PT). Flowsheets (Taken 07/05/2020 1307) LTG: Pt will perform car transfers with assist:: Supervision/Verbal cueing   Problem: RH  Ambulation Goal: LTG Patient will ambulate in controlled environment (PT) Description: LTG: Patient will ambulate in a controlled environment, # of feet with assistance (PT). Flowsheets (Taken 07/05/2020 1307) LTG: Pt will ambulate in controlled environ  assist needed:: Supervision/Verbal cueing LTG: Ambulation distance in controlled environment: at least 100 feet using LRAD Goal: LTG Patient will ambulate in home environment (PT) Description: LTG: Patient will ambulate in home environment, # of feet with assistance (PT). Flowsheets (Taken 07/05/2020 1307) LTG: Pt will ambulate in home environ  assist needed:: Supervision/Verbal cueing LTG: Ambulation distance in home environment: at least 50 feet using LRAD   Problem: RH Stairs Goal: LTG Patient will ambulate up and down stairs w/assist (PT) Description: LTG: Patient will ambulate up and down # of stairs with assistance (PT) Flowsheets (Taken 07/05/2020 1307) LTG: Pt will ambulate up/down stairs assist needed:: Contact Guard/Touching assist LTG: Pt will  ambulate up and down number of stairs: at least 2 steps with HR setup as per home environment

## 2020-07-06 NOTE — Progress Notes (Signed)
PROGRESS NOTE   Subjective/Complaints:  Patient feels nauseated this morning.  Did not sleep well last night.  Does not think it was something she ate this morning.  She is about to eat lunch.  Denies abdominal pain Review of systems negative chest pain shortness of breath, negative vomiting diarrhea constipation   Objective:   No results found. No results for input(s): WBC, HGB, HCT, PLT in the last 72 hours. No results for input(s): NA, K, CL, CO2, GLUCOSE, BUN, CREATININE, CALCIUM in the last 72 hours.  Intake/Output Summary (Last 24 hours) at 07/06/2020 1211 Last data filed at 07/06/2020 0810 Gross per 24 hour  Intake 363 ml  Output --  Net 363 ml        Physical Exam: Vital Signs Blood pressure 115/70, pulse 92, temperature 98.8 F (37.1 C), resp. rate 16, height 5\' 4"  (1.626 m), weight 51.9 kg, SpO2 99 %.  General: No acute distress Mood and affect are appropriate Heart: Regular rate and rhythm no rubs murmurs or extra sounds Lungs: Clear to auscultation, breathing unlabored, no rales or wheezes Abdomen: Positive bowel sounds, soft nontender to palpation, nondistended Extremities: No clubbing, cyanosis, or edema Skin: No evidence of breakdown, no evidence of rash  Neurologic: Cranial nerves II through XII intact, motor strength is 4/5 in bilateral deltoid, bicep, tricep, grip, hip flexor, knee extensors, ankle dorsiflexor and plantar flexor   Musculoskeletal: Full range of motion in all 4 extremities. No joint swelling  Assessment/Plan: 1. Functional deficits which require 3+ hours per day of interdisciplinary therapy in a comprehensive inpatient rehab setting.  Physiatrist is providing close team supervision and 24 hour management of active medical problems listed below.  Physiatrist and rehab team continue to assess barriers to discharge/monitor patient progress toward functional and medical goals  Care  Tool:  Bathing    Body parts bathed by patient: Right arm,Left arm,Chest,Abdomen,Front perineal area,Right upper leg,Left upper leg,Face   Body parts bathed by helper: Right lower leg,Left lower leg,Buttocks     Bathing assist Assist Level: Minimal Assistance - Patient > 75%     Upper Body Dressing/Undressing Upper body dressing   What is the patient wearing?: Pull over shirt    Upper body assist Assist Level: Supervision/Verbal cueing    Lower Body Dressing/Undressing Lower body dressing      What is the patient wearing?: Underwear/pull up     Lower body assist Assist for lower body dressing: Moderate Assistance - Patient 50 - 74%     Toileting Toileting    Toileting assist Assist for toileting: Moderate Assistance - Patient 50 - 74%     Transfers Chair/bed transfer  Transfers assist     Chair/bed transfer assist level: Minimal Assistance - Patient > 75%     Locomotion Ambulation   Ambulation assist      Assist level: Minimal Assistance - Patient > 75% Assistive device: Walker-rolling Max distance: 8 ft   Walk 10 feet activity   Assist  Walk 10 feet activity did not occur: Safety/medical concerns        Walk 50 feet activity   Assist Walk 50 feet with 2 turns activity did not occur: Safety/medical  concerns         Walk 150 feet activity   Assist Walk 150 feet activity did not occur: Safety/medical concerns         Walk 10 feet on uneven surface  activity   Assist Walk 10 feet on uneven surfaces activity did not occur: Safety/medical concerns         Wheelchair     Assist Will patient use wheelchair at discharge?: Yes Type of Wheelchair: Manual Wheelchair activity did not occur: Safety/medical concerns         Wheelchair 50 feet with 2 turns activity    Assist    Wheelchair 50 feet with 2 turns activity did not occur: Safety/medical concerns       Wheelchair 150 feet activity     Assist   Wheelchair 150 feet activity did not occur: Safety/medical concerns       Blood pressure 115/70, pulse 92, temperature 98.8 F (37.1 C), resp. rate 16, height 5\' 4"  (1.626 m), weight 51.9 kg, SpO2 99 %.  Medical Problem List and Plan: 1.Functional and mobility deficitssecondary to debility after multiple medical issues-CHF, hx of C diff colitis, recent R hip fx 12/21 with ORIF -patient may shower -ELOS/Goals: 7-10 days, supervision with PT, OT Rehab eval's today patient with poor endurance 2. Antithrombotics: -DVT/anticoagulation:Mechanical:Antiembolism stockings, knee (TED hose) Bilateral lower extremities Sequential compression devices, below kneeBilateral lower extremities -antiplatelet therapy: ASA/plavix 3.Chronic daily frequent HA/Pain Management:tylenol for basic pain -fioricet for breakthrough headaches (on this PTA) 4. Mood:fairly positive. Team to provide ego support as needed, SW eval -antipsychotic agents: n/a -trazodone prn for sleep 5. Neuropsych: This patientiscapable of making decisions on herown behalf. 6. Skin/Wound Care:Routine pressure relief measures. 7. Fluids/Electrolytes/Nutrition:Monitor I/O. Has low protein stores with alb-1.5 --Continue boost for supplement.  --Electrolyte abnormalities being addressed with PD. 8. ESRD: Continue daily PD, dialysis doing well at this point. --Hyponatremia stable around 130 9. CAD s/p PCI: On ASA/Plavix, metoprolol and lipitor. --Monitor for symptoms with increase in activity.  10. New onset seizures: On Keppra bid --off Wellbutrin. Monitor for anxiety symptoms. 11. H/o A flutter: Monitor HR tid--continue metoprolol. 12. Anemia of chronic disease: Monitor with serial H/H--improved post transfusion.  --On Aranesp weekly 13. C diff colitis: Completed  treatment with Dificid on 02/27 -off contact precautions, no abdominal pain or diarrhea 14. Chronic combined CHF: Takatsubo CM with EF--25-30% -- Monitor weights daily.  --On demadex bid with Kdur for supplement as well as metoprolol and statin. #15.  Nausea, recent C. difficile colitis, end-stage renal disease, abdominal exam benign, follow-up with renal, may be related to her end-stage renal disease   16.  Insomnia uses Ambien at home we will discontinue trazodone  .  LOS: 2 days A FACE TO FACE EVALUATION WAS PERFORMED  Charlett Blake 07/06/2020, 12:11 PM

## 2020-07-06 NOTE — Progress Notes (Addendum)
Subjective:  Awoken from sleep , no cos, tolerated PD last night , still attached.   Objective Vital signs in last 24 hours: Vitals:   07/05/20 1402 07/05/20 1710 07/05/20 2022 07/06/20 0519  BP: 119/73 122/78 118/79 115/70  Pulse: 91 92 97 92  Resp: 16 16 16 16   Temp: 98.3 F (36.8 C) 98.2 F (36.8 C) 98.3 F (36.8 C) 98.8 F (37.1 C)  TempSrc: Oral Oral    SpO2: 100% 100% 99% 99%  Weight:  50.5 kg  51.9 kg  Height:       Weight change: -0.6 kg   Physical Exam General:chronically ill appearing female in NAD Heart:RRR, no mrg Lungs:mostly CTAB, no wheezes, rales or rhonchi rm air Abdomen:soft, NTND, PD cath still connected ,no dc at dc Extrem  1+ hip edema trace pretibial  Dialysis Access: PD cath in RLQ c/d/i    Dialysis Orders: [place] followed by Dr. Candiss Norse  Problem/Plan: 1. Multivessel CAD with reduced EF 25/30%: HF team following.Eval by TCTS"poor CABG candidate" status post PCI to LAD with DESon 2/25.on Plavix and ASA 81 mg, high-dose statin 2. Debility 2/2 Mu;tiple med issues mainly CAD as above now on CIR 3. ESRD -stable on PD, also on torsemide 100 mg daily with some results per patient minimal documentation I/Os.last K  4.0.Continue KCl [id number] am labs  4. HTN/volume -BPin goal,on metoprolol 25 mg daily and okay to start losartan 25 mg as outpatient per cardiology recommendation. Continue torsemide and PD to reduce volume.Had some vol ^ on admit  resolving on exam, no sob.  when  used all 2.5% to help with UF but she was dizzy so will continue with 1/2 1.5% and 2.5% 5. Anemia -Hgb9.3 s/p 1unitPRBCson 2/20 and again 2/27. Continue Aranesp 100 MCG q Saturday. tsat 91%,ferritin 1131. No need for iron at this time. 6. Secondary hyperparathyroidismno binder listed, on Sensipar 30,stoppedRocaltrol with elevatedcorrectedcalcium>10, phos 3 7. Hyponatremia -131 NA, chronic and may need to use all 2.5% bags if   remains low. 8. Nutrition: Currently on dysphagia 3 diet.Albumin low at 1.4>1.5 , noted on Ensure 3 times daily,follow labs and change to renal diet if K or phos start to trend up.  9. Seizures - do to electrolyte abnormality in setting of C diff. Now on Keppra with neruo f/u.fu labs  10. C diff - finished ABX course on 2/27. 11. Disposition-CIR admit for rehab     Ernest Haber, PA-C [facility] Bee[facility] [phone] 07/06/2020,10:30 AM  LOS: 2 days   Labs: Basic Metabolic Panel: Recent Labs  Lab 06/30/20 0046 07/02/20 0052 07/03/20 0301  NA 131* 132* 131*  K 4.0 3.4* 4.0  CL 99 99 98  CO2 21* 18* 21*  GLUCOSE 114* 90 100*  BUN 30* 28* 27*  CREATININE 4.75* 4.68* 4.71*  CALCIUM 8.2* 8.7* 8.9  PHOS 3.4  --  3.0   Liver Function Tests: Recent Labs  Lab 06/30/20 0046 07/03/20 0301  ALBUMIN 1.4* 1.5*   No results for input(s): LIPASE, AMYLASE in the last 168 hours. Recent Labs  Lab 06/29/20 1042  AMMONIA 74*   CBC: Recent Labs  Lab 06/29/20 1042 06/30/20 0046 07/02/20 0052  WBC  --  10.4 8.0  HGB 11.0* 9.8* 9.3*  HCT 33.1* 29.4* 27.3*  MCV  --  89.9 91.3  PLT  --  227 243   Cardiac Enzymes: No results for input(s): CKTOTAL, CKMB, CKMBINDEX, TROPONINI in the last 168 hours. CBG: No results for  input(s): GLUCAP in the last 168 hours.  Studies/Results: No results found. Medications: . dialysis solution 1.5% low-MG/low-CA    . dialysis solution 2.5% low-MG/low-CA     . aspirin  81 mg Oral Daily  . atorvastatin  80 mg Oral q1800  . Chlorhexidine Gluconate Cloth  6 each Topical Daily  . cinacalcet  30 mg Oral Q breakfast  . clopidogrel  75 mg Oral Daily  . darbepoetin (ARANESP) injection - NON-DIALYSIS  100 mcg Subcutaneous Q Sat-1800  . feeding supplement  1 Container Oral TID BM  . gentamicin cream  1 application Topical Daily  . levETIRAcetam  500 mg Oral BID  . metoprolol succinate  25 mg Oral Daily  . multivitamin  1 tablet Oral  QHS  . pantoprazole  40 mg Oral Daily  . potassium chloride  10 mEq Oral Daily  . sodium chloride flush  3 mL Intravenous Q12H  . torsemide  100 mg Oral BID     I have seen and examined this patient and agree with plan and assessment in the above note with renal recommendations/intervention highlighted.  Tolerated PD well overnight.  No changes.  Broadus John A Kyri Dai,MD 07/06/2020 11:53 AM

## 2020-07-07 DIAGNOSIS — R5381 Other malaise: Secondary | ICD-10-CM | POA: Diagnosis not present

## 2020-07-07 DIAGNOSIS — E46 Unspecified protein-calorie malnutrition: Secondary | ICD-10-CM

## 2020-07-07 DIAGNOSIS — R112 Nausea with vomiting, unspecified: Secondary | ICD-10-CM | POA: Diagnosis not present

## 2020-07-07 DIAGNOSIS — D638 Anemia in other chronic diseases classified elsewhere: Secondary | ICD-10-CM

## 2020-07-07 DIAGNOSIS — G40909 Epilepsy, unspecified, not intractable, without status epilepticus: Secondary | ICD-10-CM

## 2020-07-07 DIAGNOSIS — I5042 Chronic combined systolic (congestive) and diastolic (congestive) heart failure: Secondary | ICD-10-CM

## 2020-07-07 DIAGNOSIS — E8809 Other disorders of plasma-protein metabolism, not elsewhere classified: Secondary | ICD-10-CM

## 2020-07-07 DIAGNOSIS — R569 Unspecified convulsions: Secondary | ICD-10-CM

## 2020-07-07 DIAGNOSIS — G894 Chronic pain syndrome: Secondary | ICD-10-CM

## 2020-07-07 LAB — RENAL FUNCTION PANEL
Albumin: 1.7 g/dL — ABNORMAL LOW (ref 3.5–5.0)
Anion gap: 13 (ref 5–15)
BUN: 19 mg/dL (ref 8–23)
CO2: 23 mmol/L (ref 22–32)
Calcium: 8.6 mg/dL — ABNORMAL LOW (ref 8.9–10.3)
Chloride: 92 mmol/L — ABNORMAL LOW (ref 98–111)
Creatinine, Ser: 4.76 mg/dL — ABNORMAL HIGH (ref 0.44–1.00)
GFR, Estimated: 9 mL/min — ABNORMAL LOW (ref >60)
Glucose, Bld: 101 mg/dL — ABNORMAL HIGH (ref 70–99)
Phosphorus: 3.7 mg/dL (ref 2.5–4.6)
Potassium: 3 mmol/L — ABNORMAL LOW (ref 3.5–5.1)
Sodium: 128 mmol/L — ABNORMAL LOW (ref 135–145)

## 2020-07-07 LAB — CBC
HCT: 30.7 % — ABNORMAL LOW (ref 36.0–46.0)
Hemoglobin: 10.6 g/dL — ABNORMAL LOW (ref 12.0–15.0)
MCH: 32 pg (ref 26.0–34.0)
MCHC: 34.5 g/dL (ref 30.0–36.0)
MCV: 92.7 fL (ref 80.0–100.0)
Platelets: 294 10*3/uL (ref 150–400)
RBC: 3.31 MIL/uL — ABNORMAL LOW (ref 3.87–5.11)
RDW: 20.4 % — ABNORMAL HIGH (ref 11.5–15.5)
WBC: 6.3 10*3/uL (ref 4.0–10.5)
nRBC: 0 % (ref 0.0–0.2)

## 2020-07-07 LAB — OCCULT BLOOD X 1 CARD TO LAB, STOOL: Fecal Occult Bld: NEGATIVE

## 2020-07-07 MED ORDER — BUTALBITAL-APAP-CAFFEINE 50-325-40 MG PO TABS
1.0000 | ORAL_TABLET | Freq: Once | ORAL | Status: AC
  Administered 2020-07-07: 1 via ORAL

## 2020-07-07 MED ORDER — DELFLEX-LC/1.5% DEXTROSE 344 MOSM/L IP SOLN: INTRAPERITONEAL | Status: DC

## 2020-07-07 NOTE — Progress Notes (Signed)
PROGRESS NOTE   Subjective/Complaints: Patient seen sitting up in bed this morning.  She states she slept well overnight.  She states she had good weekend.  She notes she had a bowel movement.  She does complain of nausea, which improved with medications.  She is seen by nephrology yesterday, notes reviewed-considering renal diet with pending electrolyte results.  Review of systems: +Nausea.  Denies CP, shortness of breath, diarrhea.  Objective:   No results found. Recent Labs    07/07/20 0448  WBC 6.3  HGB 10.6*  HCT 30.7*  PLT 294   Recent Labs    07/07/20 0448  NA 128*  K 3.0*  CL 92*  CO2 23  GLUCOSE 101*  BUN 19  CREATININE 4.76*  CALCIUM 8.6*    Intake/Output Summary (Last 24 hours) at 07/07/2020 1104 Last data filed at 07/07/2020 0818 Gross per 24 hour  Intake 363 ml  Output --  Net 363 ml        Physical Exam: Vital Signs Blood pressure 116/74, pulse 87, temperature 98.3 F (36.8 C), temperature source Oral, resp. rate 16, height 5\' 4"  (1.626 m), weight 50.1 kg, SpO2 98 %. Constitutional: No distress . Vital signs reviewed. HENT: Normocephalic.  Atraumatic. Eyes: EOMI. No discharge. Cardiovascular: No JVD.  RRR. Respiratory: Normal effort.  No stridor.  Bilateral clear to auscultation. GI: Non-distended.  BS +. Skin: Warm and dry.  Intact. Psych: Normal mood.  Normal behavior. Musc: No edema in extremities.  No tenderness in extremities. Neurologic: Alert  Motor: 4/5 throughout  Assessment/Plan: 1. Functional deficits which require 3+ hours per day of interdisciplinary therapy in a comprehensive inpatient rehab setting.  Physiatrist is providing close team supervision and 24 hour management of active medical problems listed below.  Physiatrist and rehab team continue to assess barriers to discharge/monitor patient progress toward functional and medical goals  Care Tool:  Bathing    Body  parts bathed by patient: Right arm,Left arm,Chest,Abdomen,Front perineal area,Right upper leg,Left upper leg,Face,Right lower leg,Left lower leg   Body parts bathed by helper: Buttocks     Bathing assist Assist Level: Minimal Assistance - Patient > 75%     Upper Body Dressing/Undressing Upper body dressing   What is the patient wearing?: Button up shirt    Upper body assist Assist Level: Moderate Assistance - Patient 50 - 74%    Lower Body Dressing/Undressing Lower body dressing      What is the patient wearing?: Underwear/pull up,Pants     Lower body assist Assist for lower body dressing: Contact Guard/Touching assist     Toileting Toileting    Toileting assist Assist for toileting: Minimal Assistance - Patient > 75%     Transfers Chair/bed transfer  Transfers assist     Chair/bed transfer assist level: Minimal Assistance - Patient > 75%     Locomotion Ambulation   Ambulation assist      Assist level: Minimal Assistance - Patient > 75% Assistive device: Walker-rolling Max distance: 8 ft   Walk 10 feet activity   Assist  Walk 10 feet activity did not occur: Safety/medical concerns        Walk 50 feet activity  Assist Walk 50 feet with 2 turns activity did not occur: Safety/medical concerns         Walk 150 feet activity   Assist Walk 150 feet activity did not occur: Safety/medical concerns         Walk 10 feet on uneven surface  activity   Assist Walk 10 feet on uneven surfaces activity did not occur: Safety/medical concerns         Wheelchair     Assist Will patient use wheelchair at discharge?: Yes Type of Wheelchair: Manual Wheelchair activity did not occur: Safety/medical concerns         Wheelchair 50 feet with 2 turns activity    Assist    Wheelchair 50 feet with 2 turns activity did not occur: Safety/medical concerns       Wheelchair 150 feet activity     Assist  Wheelchair 150 feet activity  did not occur: Safety/medical concerns       Medical Problem List and Plan: 1.Functional and mobility deficitssecondary to debility after multiple medical issues-CHF, hx of C diff colitis, recent R hip fx 12/21 with ORIF  Continue CIR  2. Antithrombotics: -DVT/anticoagulation:Mechanical:Antiembolism stockings, knee (TED hose) Bilateral lower extremities Sequential compression devices, below kneeBilateral lower extremities -antiplatelet therapy: ASA/plavix 3.Chronic daily frequent HA/Pain Management:tylenol for basic pain -fioricet for breakthrough headaches (on this PTA)  Controlled with meds on 3/7 4. Mood:fairly positive. Team to provide ego support as needed -antipsychotic agents: n/a 5. Neuropsych: This patientiscapable of making decisions on herown behalf. 6. Skin/Wound Care:Routine pressure relief measures. 7. Fluids/Electrolytes/Nutrition:Monitor I/Os.  8. ESRD: Continue daily PD, dialysis doing well at this point. 9. CAD s/p PCI: On ASA/Plavix, metoprolol and lipitor. Monitor for symptoms with increase in activity.  10. New onset seizures: On Keppra bid Off Wellbutrin. Monitor for anxiety symptoms.  No breakthrough seizures since admission to CIR 11. H/o A flutter: Monitor HR tid--continue metoprolol. 12. Anemia of chronic disease: Monitor with serial H/H--improved post transfusion.  --On Aranesp weekly  Hemoglobin 10.6 on 3/7   Continue to monitor 13. C diff colitis: Completed treatment with Dificid on 02/27 -off contact precautions 14. Chronic combined CHF: Takatsubo CM with EF--25-30% -- Monitor weights daily.  --On demadex bid with Kdur for supplement as well as metoprolol and statin. Filed Weights   07/06/20 0519 07/06/20 1120 07/07/20 0435  Weight: 51.9 kg 50.4 kg 50.1 kg  15.  Nausea, recent C. difficile colitis, end-stage renal  disease  ?  Secondary to PD and electrolyte imbalance 16.  Sleep disturbance   Used Ambien at home.  Discontinue, start as needed trazodone 17.  Hypoalbuminemia  Supplement initiated  LOS: 3 days A FACE TO FACE EVALUATION WAS PERFORMED  Edrik Rundle Lorie Phenix 07/07/2020, 11:04 AM

## 2020-07-07 NOTE — Progress Notes (Signed)
Spoke to Hemodialysis about  Peritoneal Dialysis hookup. Dialysis RN stated he was unsure if he was able to come tonight. Charge RN Collie Siad notified.

## 2020-07-07 NOTE — Progress Notes (Signed)
Inpatient Rehabilitation Care Coordinator Assessment and Plan Patient Details  Name: Amy [name] MRN: [id number] Date of Birth: 11/28/1950  Today's Date: 07/07/2020  Hospital Problems: Principal Problem:   Debility  Past Medical History:  Past Medical History:  Diagnosis Date  . Anemia    a. 08/2016 Guaiac + stool. EGD w/ gastritis.  . Anxiety   . CKD (chronic kidney disease), stage III ([place])   . Closed fracture of shaft of left ulna 11/27/2018  . Colon polyp    a. 08/2016 Colonoscopy.  . Gastritis    a. 08/2016 EGD: Gastritis-->PPI.  [name] GERD (gastroesophageal reflux disease)   . History of chicken pox   . History of measles as a child   . Hypertension   . Hyponatremia    a. 08/2016 in setting of HCTZ Rx.  . Mesenteric lymphadenopathy 04/08/2017  . Multiple thyroid nodules    Past Surgical History:  Past Surgical History:  Procedure Laterality Date  . ABDOMINAL HYSTERECTOMY  2003   due to heavy periods and clotting during menses  . BREAST LUMPECTOMY Left 1990  . CHOLECYSTECTOMY  2010  . COLONOSCOPY WITH PROPOFOL N/A 09/07/2016   Procedure: COLONOSCOPY WITH PROPOFOL;  Surgeon: Lucilla Lame, MD;  Location: [facility] ENDOSCOPY;  Service: Endoscopy;  Laterality: N/A;  . CORONARY STENT INTERVENTION N/A 06/27/2020   Procedure: CORONARY STENT INTERVENTION;  Surgeon: Sherren Mocha, MD;  Location: [place] CV LAB;  Service: Cardiovascular;  Laterality: N/A;  . DIALYSIS/PERMA CATHETER INSERTION N/A 11/16/2018   Procedure: DIALYSIS/PERMA CATHETER INSERTION;  Surgeon: Algernon Huxley, MD;  Location: [place] CV LAB;  Service: Cardiovascular;  Laterality: N/A;  . DIALYSIS/PERMA CATHETER REMOVAL N/A 08/16/2019   Procedure: DIALYSIS/PERMA CATHETER REMOVAL;  Surgeon: Algernon Huxley, MD;  Location: [place] CV LAB;  Service: Cardiovascular;  Laterality: N/A;  . DILATION AND CURETTAGE OF UTERUS  1985  . ESOPHAGOGASTRODUODENOSCOPY (EGD) WITH PROPOFOL N/A 09/07/2016   Procedure:  ESOPHAGOGASTRODUODENOSCOPY (EGD) WITH PROPOFOL;  Surgeon: Lucilla Lame, MD;  Location: [facility] ENDOSCOPY;  Service: Endoscopy;  Laterality: N/A;  . GALLBLADDER SURGERY  2012  . INTRAMEDULLARY (IM) NAIL INTERTROCHANTERIC Right 04/23/2020   Procedure: INTRAMEDULLARY (IM) NAIL INTERTROCHANTRIC;  Surgeon: Thornton Park, MD;  Location: [facility] ORS;  Service: Orthopedics;  Laterality: Right;  . INTRAVASCULAR IMAGING/OCT N/A 06/27/2020   Procedure: INTRAVASCULAR IMAGING/OCT;  Surgeon: Sherren Mocha, MD;  Location: [place] CV LAB;  Service: Cardiovascular;  Laterality: N/A;  . KNEE SURGERY     Dr. Tamala Julian  . LEFT HEART CATH AND CORONARY ANGIOGRAPHY N/A 06/25/2020   Procedure: LEFT HEART CATH AND CORONARY ANGIOGRAPHY;  Surgeon: Troy Sine, MD;  Location: [place] CV LAB;  Service: Cardiovascular;  Laterality: N/A;  . TONSILLECTOMY    . TONSILLECTOMY AND ADENOIDECTOMY  1970  . UPPER GI ENDOSCOPY  03/02/2006   H. Pylori negative; Dr. Allen Norris   Social History:  reports that she has never smoked. She has never used smokeless tobacco. She reports current alcohol use. She reports that she does not use drugs.  Family / Support Systems Marital Status: Married Patient Roles: Spouse,Parent Spouse/Significant Other: Legrand Como [phone] cell Children: Jarrett Soho Allen-daughter [phone] Other Supports: Friends Anticipated Caregiver: Husband and daughter Ability/Limitations of Caregiver: Husband works in Hotel manager 7:30-5 pm and daughter has her own business-[employer] Firefighter Availability: Evenings only Family Dynamics: Close with daughter and she is Radiation protection practitioner, husband was retired then went back to work in Hotel manager, so not available to provide 24/7 care. Pt was mod/i with walker after hip  fracture but with COVID and C-diff she is weaker and needs more therapies before going home this time  Social History Preferred language: English Religion: None Cultural Background: No issues Education: Secretary/administrator  educated Read: Yes Write: Yes Employment Status: Retired Public relations account executive Issues: No issues Guardian/Conservator: None-according to MD pt is capable of making her own decisions while here. Daughter and husband will be here in the evenings to provide support   Abuse/Neglect Abuse/Neglect Assessment Can Be Completed: Yes Physical Abuse: Denies Verbal Abuse: Denies Sexual Abuse: Denies Exploitation of patient/patient's resources: Denies Self-Neglect: Denies  Emotional Status Pt's affect, behavior and adjustment status: Pt is motivated to do well but is very deconditioned from [place] and C-diff, she hopes to get back to using the RW independently and can be home alone safely, due to husband and daughter are at work. She does do her own PD and feels can reach goals but will take time Recent Psychosocial Issues: other health issues-PD, fractured hip and COVID along with C-diff Psychiatric History: No history deferred depression screen due to still adjusting to rehab and being moved. Would benefit from seeing neuro-psych while here. Substance Abuse History: No issues  Patient / Family Perceptions, Expectations & Goals Pt/Family understanding of illness & functional limitations: Pt and daughter can explain her issues and both speak to the mD's involved. Daughter feels she needs to stay here the 21 days told on acute. Will need to discuss in team conference if able to reach mod/i with a few more days here. Premorbid pt/family roles/activities: Wife, mother, PD patient, retiree, friend, etc Anticipated changes in roles/activities/participation: resume Pt/family expectations/goals: Pt states: " I want to get back to walking with a walker like I did before this last hospitalization."  Daughter states: " She will be alone so will need to be safe with her walker, like before."  [employer]: Other (Comment) Premorbid Home Care/DME Agencies: Other (Comment) [facility]  active with has rw, 3 in1, tub seat) Transportation available at discharge: family Resource referrals recommended: Neuropsychology  Discharge Planning Living Arrangements: Spouse/significant other Support Systems: Spouse/significant other,Children,Friends/neighbors Type of Residence: Private residence Insurance Resources: [employer] (specify) Archivist) Financial Resources: Therapist, art Screen Referred: No Living Expenses: Own Money Management: Patient,Spouse Does the patient have any problems obtaining your medications?: No Home Management: Pt did prior to hip fracture 04/2021 Patient/Family Preliminary Plans: Return home with husband who is now working in [place] M-F, daughter is self employed. Pt needs to be mod/i before going home due to will be alone during the day. Will talk with team regarding if reachable and if longer how much longer. Work on safe discharge plan for pt. Care Coordinator Barriers to Discharge: Decreased caregiver support Care Coordinator Barriers to Discharge Comments: Does not have 24/7 care at [place] Coordinator Anticipated Follow Up Needs: HH/OP  Clinical Impression Pleasant female who is motivated to do well, but has had many set backs with COVID and C-diff contracted after hip fracture. Will work on safe discharge plan. Husband and daughter will assist but evenings only.  [name] 07/07/2020, 10:45 AM

## 2020-07-07 NOTE — Progress Notes (Signed)
Occupational Therapy Session Note  Patient Details  Name: Amy [name] MRN: [id number] Date of Birth: [birth year]  Today's Date: 07/07/2020 OT Individual Time: 2449-7530 OT Individual Time Calculation (min): 76 min   Short Term Goals: Week 1:  OT Short Term Goal 1 (Week 1): Pt will complete toilet transfer with CGA from w/c. OT Short Term Goal 2 (Week 1): Pt will complete 2/3 tasks of toileting with close S. OT Short Term Goal 3 (Week 1): Pt will complete standing ADL for >5 min with CGA. OT Short Term Goal 4 (Week 1): Pt will don pants with overall min A.  Skilled Therapeutic Interventions/Progress Updates:    Pt greeted in bed with no c/o pain, agreeable to engage in ADLs EOB sit<stand with RW. Note that she was still hooked up to PD upon arrival. Pt engaged in stated tasks with setup for UB self care, CGA for dynamic standing balance during LB self care. Note that when she was completing perihygiene pt reported needing to urgently void bowels. CGA for stand pivot<BSC using device. Pt with small BM. CGA-Min A for sit>stands to complete thorough perihygiene, pt needing Min A for this. LB dressing completed with CGA after. Once she stand pivoted over to the w/c, OT washed her hair using the hair washing tray at the sink. Pt needed Max A to untangle knots from hair but used the blow-dryer on her own to work on UB strengthening and endurance. Pt then styled hair and donned shirt, affect visibly brightened. Oral care completed with setup assistance. Pt left in care of RN to return to bed.   Pt reporting minimal dizziness + nausea when sitting up today. OT provided her with antinausea aromatherapy to address. Rest breaks were built in throughout session to address dizziness and pt often drank some water to stay hydrated.   Therapy Documentation Precautions:  Precautions Precautions: Fall Precaution Comments: generalized weakness, peritoneal diaylisis, seizures, monitor O2 during  ambulation Restrictions Weight Bearing Restrictions: No Vital Signs: Therapy Vitals Temp: 98.3 F (36.8 C) Temp Source: Oral Resp: 16 BP: 116/74 Patient Position (if appropriate): Lying Pain: Pain Assessment Pain Type: Chronic pain Pain Location: Head Pain Descriptors / Indicators: Headache Pain Onset: On-going Pain Intervention(s): Distraction ADL: ADL Eating: Supervision/safety Where Assessed-Eating: Edge of bed Grooming: Supervision/safety Where Assessed-Grooming: Sitting at sink Upper Body Bathing: Supervision/safety Where Assessed-Upper Body Bathing: Edge of bed,Sitting at sink Lower Body Bathing: Minimal assistance Where Assessed-Lower Body Bathing: Sitting at sink,Edge of bed Upper Body Dressing: Supervision/safety Where Assessed-Upper Body Dressing: Edge of bed,Sitting at sink Lower Body Dressing: Moderate assistance Where Assessed-Lower Body Dressing: Edge of bed,Sitting at sink Toileting: Moderate assistance Where Assessed-Toileting: Glass blower/designer: Psychiatric nurse Method: Arts development officer: Radiographer, therapeutic: Minimal Museum/gallery conservator Method: Stand pivot Tub/Shower Equipment: Shower seat with back,Grab bars,Walk in shower      Therapy/Group: Individual Therapy  Jomayra Novitsky A Ludwin Flahive 07/07/2020, 11:40 AM

## 2020-07-07 NOTE — Progress Notes (Signed)
Inpatient [facility] Individual Statement of Services  Patient Name:  Amy [name]  Date:  07/07/2020  Welcome to the [facility].  Our goal is to provide you with an individualized program based on your diagnosis and situation, designed to meet your specific needs.  With this comprehensive rehabilitation program, you will be expected to participate in at least 3 hours of rehabilitation therapies Monday-Friday, with modified therapy programming on the weekends.  Your rehabilitation program will include the following services:  Physical Therapy (PT), Occupational Therapy (OT), 24 hour per day rehabilitation nursing, Neuropsychology, Care Coordinator, Rehabilitation Medicine, Nutrition Services and Pharmacy Services  Weekly team conferences will be held on Wednesday to discuss your progress.  Your Inpatient Rehabilitation Care Coordinator will talk with you frequently to get your input and to update you on team discussions.  Team conferences with you and your family in attendance may also be held.  Expected length of stay: 14-16 days  Overall anticipated outcome: supervision with cues  Depending on your progress and recovery, your program may change. Your Inpatient Rehabilitation Care Coordinator will coordinate services and will keep you informed of any changes. Your Inpatient Rehabilitation Care Coordinator's name and contact numbers are listed  below.  The following services may also be recommended but are not provided by the [facility] will be made to provide these services after discharge if needed.  Arrangements include referral to agencies that provide these services.  Your insurance has been verified to be:  Medicare & generic commerical Your primary doctor is:  Lelon Huh  Pertinent information will be shared with  your doctor and your insurance company.  Inpatient Rehabilitation Care Coordinator:  Ovidio Kin, [place] or Emilia Beck  Information discussed with and copy given to patient by: [name], 07/07/2020, 10:27 AM

## 2020-07-07 NOTE — Progress Notes (Signed)
[facility] Progress Note  Subjective: seen in room, has questions about her diet  Vitals:   07/06/20 1944 07/07/20 0435 07/07/20 1056 07/07/20 1239  BP: 120/67 122/81 116/74 97/72  Pulse: 97 87  95  Resp: 18 18 16 17   Temp: 99.1 F (37.3 C) 98.7 F (37.1 C) 98.3 F (36.8 C) 97.7 F (36.5 C)  TempSrc: Oral Oral Oral   SpO2: 99% 98%  100%  Weight:  50.1 kg    Height:        Exam: General: chronically ill appearing female in NAD Heart: RRR, no mrg Lungs: mostly CTAB, no wheezes, rales or rhonchi rm air Abdomen: soft, NTND, PD cath LLQ intact Extrem: 1+ hip edema trace pretibial  Neuro: NF, o x4    OP PD: [place] followed by Dr. Candiss Norse. 4 exchanges overnight, 2000 cc and 1.5 hrs each.     Assessment/ Plan: 1. Multivessel CAD with reduced EF 25/30%: HF team following.Eval by TCTS"poor CABG candidate" status post PCI to LAD with DESon 2/25.on Plavix and ASA 81 mg,+ statin 2. Debility: 2/2 Multiple med issues mainly CAD now on CIR 3. ESRD -stable on PD, torsemide 100 bid and po KCL 10 bid.  4. HTN/volume -BPin goal,on metoprolol 25 mg daily. No vol excess. When using 2.5% pt became "dizzy", so now doing all 1.5% bags.  5. Anemia -Hgb 9- 11 range. s/p 1unitPRBCon 2/20 and on 2/27. Continue Aranesp 100 MCG q Saturday. tsat 91%,ferritin 1131. No need for iron at this time. 6. Secondary hyperparathyroidism: no binder listed, on Sensipar 30/d,stoppedRocaltrol with elevatedcorrectedcalcium>10, last phos check was 3 7. Hyponatremia -131 NA, chronic  8. Nutrition: Currently on dysphagia 3 diet.Albumin low at 1.4>1.5 , noted on Ensure 3 times daily,follow labs and change to renal diet if K or phos start to trend up.  9. Seizures - do to electrolyte abnormality in setting of C diff. Now on Keppra with neruo f/u.fu labs  10. C diff - finished ABX course on 2/27. 11. Disposition-CIR admit for rehab   Amy [name] 07/07/2020,  4:05 PM   Recent Labs  Lab 07/02/20 0052 07/03/20 0301 07/07/20 0448  K 3.4* 4.0 3.0*  BUN 28* 27* 19  CREATININE 4.68* 4.71* 4.76*  CALCIUM 8.7* 8.9 8.6*  PHOS  --  3.0 3.7  HGB 9.3*  --  10.6*   Inpatient medications: . aspirin  81 mg Oral Daily  . atorvastatin  80 mg Oral q1800  . Chlorhexidine Gluconate Cloth  6 each Topical Daily  . cinacalcet  30 mg Oral Q breakfast  . clopidogrel  75 mg Oral Daily  . darbepoetin (ARANESP) injection - NON-DIALYSIS  100 mcg Subcutaneous Q Sat-1800  . feeding supplement  1 Container Oral TID BM  . gentamicin cream  1 application Topical Daily  . levETIRAcetam  500 mg Oral BID  . metoprolol succinate  25 mg Oral Daily  . multivitamin  1 tablet Oral QHS  . pantoprazole  40 mg Oral Daily  . potassium chloride  10 mEq Oral Daily  . sodium chloride flush  3 mL Intravenous Q12H  . torsemide  100 mg Oral BID   . dialysis solution 1.5% low-MG/low-CA    . dialysis solution 2.5% low-MG/low-CA     acetaminophen, aluminum hydroxide, bisacodyl, butalbital-acetaminophen-caffeine, diphenhydrAMINE, Gerhardt's butt cream, guaiFENesin-dextromethorphan, dianeal solution for CAPD/[name] with heparin, dianeal solution for CAPD/[name] with heparin, milk and molasses, ondansetron **OR** ondansetron (ZOFRAN) IV, polyethylene glycol, zolpidem

## 2020-07-07 NOTE — Progress Notes (Signed)
Initial Nutrition Assessment  DOCUMENTATION CODES:   Severe malnutrition in context of chronic illness  INTERVENTION:   - Continue Boost Breeze po TID, each supplement provides 250 kcal and 9 grams of protein (wild berry flavor)  - Magic Cup TID with meals, each supplement provides 290 kcal and 9 grams of protein  - Continue renal MVI daily  - Encourage adequate PO intake  NUTRITION DIAGNOSIS:   Severe Malnutrition related to chronic illness (ESRD, CHF) as evidenced by severe fat depletion,severe muscle depletion.  GOAL:   Patient will meet greater than or equal to 90% of their needs  MONITOR:   PO intake,Supplement acceptance,Labs,Weight trends,I & O's  REASON FOR ASSESSMENT:   Other (history of malnutrition)    ASSESSMENT:   70 year old female with PMH of ESRD on PD, HTN, gastritis, anxiety, right hip fracture s/p ORIF 04/2020, recent admission for diarrhea (found to be COVID+ also) due to C diff colitis. Pt was was readmitted on 06/19/20 with mental status changes due to brief seizure from multiple metabolic derangements related to volume depletion. Pt found to have Takotsubo cardiomyopathy and combined CHF. Pt underwent cardiac cath revealing severe multivessel disease and underwent PCI 2/25 as not felt to be CABG candidate. Admitted to CIR on 3/04.   Spoke with pt briefly at bedside. Pt with guest in room and reports guest traveled a long way to visit.  Pt reports not liking the food here at the hospital. She states that she is just being sent up food and not placing her own orders. RD will change pt's meal ordering status to "with assist." Pt does not like Ensure or Nepro but does tolerating wild berry flavored Boost Breeze. Will continue with this supplement.  Pt with dysphagia 3 diet order to aid in ease of PO intake. Pt reports chopped meats are going well. She notes no issues with dysphagia 3 diet. RD encouraged pt to have husband and daughter (who visit frequently)  to bring in outside food.  Reviewed weight history in chart. Pt with a 3.4 kg weight loss since 04/26/20. This is a 6.4% weight loss in 3 months which is not quite significant for timeframe but is concerning given malnutrition.  Discussed importance of adequate PO intake in maintaining lean muscle mass and preventing dry weight loss. Pt expresses understanding.  Meal Completion: 25-55%  Medications reviewed and include: sensipar daily, aranesp weekly, Boost Breeze TID, rena-vit, protonix, klor-con 10 mEq daily, torsemide  Labs reviewed: sodium 128, potassium 3.0  NUTRITION - FOCUSED PHYSICAL EXAM:  Flowsheet Row Most Recent Value  Orbital Region Severe depletion  Upper Arm Region Severe depletion  Thoracic and Lumbar Region Severe depletion  Buccal Region Mild depletion  Temple Region Moderate depletion  Clavicle Bone Region Severe depletion  Clavicle and Acromion Bone Region Severe depletion  Scapular Bone Region Moderate depletion  Dorsal Hand Moderate depletion  Patellar Region Moderate depletion  Anterior Thigh Region Moderate depletion  Posterior Calf Region Moderate depletion  Edema (RD Assessment) Mild  [BLE]  Hair Reviewed  Eyes Reviewed  Mouth Reviewed  Skin Reviewed  Nails Reviewed       Diet Order:   Diet Order            DIET DYS 3 Room service appropriate? Yes; Fluid consistency: Thin  Diet effective now                 EDUCATION NEEDS:   Education needs have been addressed  Skin:  Skin Assessment: Reviewed RN  Assessment  Last BM:  07/07/20  Height:   Ht Readings from Last 1 Encounters:  07/05/20 5\' 4"  (1.626 m)    Weight:   Wt Readings from Last 1 Encounters:  07/07/20 50.1 kg    BMI:  Body mass index is 18.96 kg/m.  Estimated Nutritional Needs:   Kcal:  1700-1900  Protein:  85-100 grams  Fluid:  1000 ml + UOP    Gustavus Bryant, MS, RD, LDN Inpatient Clinical Dietitian Please see AMiON for contact information.

## 2020-07-07 NOTE — IPOC Note (Signed)
Individualized overall Plan of Care (IPOC) Patient Details Name: Amy [name] MRN: [id number] DOB: 1950-12-25  Admitting Diagnosis: [facility] Problems: Principal Problem:   Debility Active Problems:   Hypoalbuminemia due to protein-calorie malnutrition (HCC)   Nausea and vomiting   Chronic combined systolic and diastolic CHF (congestive heart failure) (HCC)   Anemia of chronic disease   Seizures (HCC)   Chronic pain syndrome     Functional Problem List: Nursing Medication Management,Safety,Pain  PT Balance,Endurance,Motor,Pain,Safety  OT Motor,Endurance,Skin Integrity,Balance  SLP    TR         Basic ADL's: OT Eating,Grooming,Bathing,Dressing,Toileting     Advanced  ADL's: OT Simple Meal Preparation     Transfers: PT Bed Mobility,Bed to Chair,Car,Furniture  OT Tub/Shower,Toilet     Locomotion: PT Ambulation,Wheelchair Mobility,Stairs     Additional Impairments: OT None  SLP        TR      Anticipated Outcomes Item Anticipated Outcome  Self Feeding ind  Swallowing      Basic self-care  mod I  Toileting  mod I   Bathroom Transfers mod I  Bowel/Bladder  n/a  Transfers  supervision for all functional transfers (may need to be Mod I if she will not have daily supervision on d/c)  Locomotion     Communication     Cognition     Pain  Pain at or below level 4  Safety/Judgment  Maintain safety with cues/reminders   Therapy Plan: PT Intensity: Minimum of 1-2 x/day ,45 to 90 minutes PT Frequency: 5 out of 7 days PT Duration Estimated Length of Stay: 12-14 days OT Intensity: Minimum of 1-2 x/day, 45 to 90 minutes OT Frequency: 5 out of 7 days OT Duration/Estimated Length of Stay: 14 to 16 days      Team Interventions: Nursing Interventions Patient/Family Education,Disease Management/Prevention,Medication [employer] Planning  PT interventions Ambulation/gait training,Balance/vestibular training,Community  reintegration,Discharge planning,Disease management/prevention,DME/adaptive equipment instruction,Functional mobility training,Neuromuscular re-education,Pain management,Patient/family education,Psychosocial support,Skin care/wound management,Stair training,Therapeutic Activities,Therapeutic Exercise,UE/LE Strength taining/ROM,UE/LE Coordination activities,Visual/perceptual remediation/compensation  OT Interventions Balance/vestibular training,Neuromuscular re-education,Disease mangement/prevention,Self Care/advanced ADL retraining,Therapeutic Exercise,Wheelchair propulsion/positioning,Skin care/wound managment,UE/LE Strength taining/ROM,Pain Education administrator instruction,Community reintegration,Patient/family education,UE/LE Coordination activities,Therapeutic Activities,Psychosocial support,Functional mobility training,Discharge planning  SLP Interventions    TR Interventions    SW/CM Interventions Discharge Planning,Psychosocial Support,Patient/Family Education   Barriers to Discharge MD  Medical stability and PD  Nursing Decreased caregiver support    PT Inaccessible home environment,Decreased caregiver support,Home environment access/layout,Incontinence,Lack of/limited family support,Wound Care,Hemodialysis    OT Decreased caregiver support Will be home along majority of day.  SLP      SW Decreased caregiver support Does not have 24/7 care at DC   Team Discharge Planning: Destination: PT-Home ,OT- Home , SLP-  Projected Follow-up: PT-Home health PT, OT-  Outpatient OT, SLP-  Projected Equipment Needs: PT-To be determined, OT- None recommended by OT, SLP-  Equipment Details: PT- , OT-Pt already owns 3in1, shower chair Patient/family involved in discharge planning: PT- Patient,  OT-Patient, SLP-   MD ELOS: 10-14 days. Medical Rehab Prognosis:  Good Assessment: 70 year old female with history of ESRD- on PD, HTN, gastritis, A flutter, anxiety d/o, R-hip fracture s/p ORIF  04/2020, recent admission for diarrhea (found to be Covid + also) due to C diff colitis -> d/c to home on oral Vanc but unable to get meds who was readmitted on 06/19/20 with mental status changes due to brief seizure from multiple metabolic derangementsfromvolume depletion. EEG showed nonspecific cortical dysfunction in bifrontal region with moderate  diffuse encephalopathy. She was loaded with keppra and found to have elevated cardiac enzymes due to NSTEMI due to demand ischemia. Dr. Jory Sims recommends keppra 500 mg bid for new onset seizure with neuro follow up for adjustment or wean-->seizure provoked by multiple metabolic derangements and no driving X 6 months. 2D echo showed Takotsubo cardiomyopathy and combined systolic/diastolic. CHF being managed with PD and sinus tach managed with addition of BB. CTA chest negative for PE but showed coronary calcification. She underwent cardiac cath revealing severe multivessel disease and underwent PCI 02/25 as not felt to be CABG candidate. She developed RUE hematoma post procedure which was treated with elevation/compression and acute on chronic anemia transfused with one unit PRBC. Dr. Felizardo Hoffmann team following for input on meds which have been optimized and limited by BP. She continues to be limited by fatigue and weakness with decreased WB LLE, delayed processing with memory deficits affecting activity.  We will set goals for mod I with PT/OT.  Due to the current state of emergency, patients may not be receiving their 3-hours of Medicare-mandated therapy.  See Team Conference Notes for weekly updates to the plan of care

## 2020-07-07 NOTE — Progress Notes (Signed)
Physical Therapy Session Note  Patient Details  Name: Amy [name] MRN: [id number] Date of Birth: 04-07-1951  Today's Date: 07/07/2020 PT Individual Time:Session1: 6010-9323; [name]:  [id number] PT Individual Time Calculation (min): 55 min & 55 min  Short Term Goals: Week 1:  PT Short Term Goal 1 (Week 1): Pt will perform bed mobility with supervision. PT Short Term Goal 2 (Week 1): Pt will perform STS and SPVT transfers with CGA. PT Short Term Goal 3 (Week 1): Pt will ambulate at least 35 feet using RW with CGA PT Short Term Goal 4 (Week 1): Pt will perform 1 step using RW with Min A.  Skilled Therapeutic Interventions/Progress Updates:    Session1:  Patient in supine and reports still hooked to dialysis.  Patient performed in bed therex as noted below.  RN in from dialysis to [employer].  Patient supine to sit with rails and S with increased time.  Patient sit to stand from EOB min A to RW.  Reported felt as if having BM so ambulated with RW to bathroom with min to mod A for safety x 15'.  To toilet with cues for grabbar use with mod A for lowering assist.  Patient performed seated hygiene, then sit to stand max A from low toilet seat.  Assisted to complete hygiene in standing.  Ambulated 12' to w/c out of bathroom with RW and min to mod A.  Patient propelled w/c x 120' with S to min A with cues for technique.  Seated in w/c for kinetron x 2 minutes for LE strengthening at 50 cm/sec.  Patient assisted in w/c to room.  Obtained recliner and pt sit to stand from w/c with mod A and ambulated to recliner x 12' with min A.  Left with seat alarm and sister in law in the room with call bell in reach.  [place]:  Patient in recliner and reports she has thrown up twice and got a shot.  Agreeable to therapy as long as able to stay in chair.  Performed 3 x 1 minute bouts on pedal exerciser for LE strength/endurance.  Performed bicep curls with 4# bar x 10, shoulder press x 8; shoulder flexion x 8.  Seated  LAQ & hip flexion with 2# weight on L and 2.5 # on R.  Patient requesting to toilet so assisted sit to stand and step with RW to w/c min A.  Pushed in w/c to bathroom and pt used grabbar with min A for stepping to toilet with min A for doffing clothes.  Patient toileted and performed hygiene seated.  Patient sit to stand mod A and step to w/c with min A using grabbar.  Patient requesting to supine so assisted in w/c to bedside and step to bed with RW and min A.  Sit to supine with mod A for LE's and cues for positioning.  Patient left in supine with call bell and needs in reach and bed alarm active.   Therapy Documentation Precautions:  Precautions Precautions: Fall Precaution Comments: generalized weakness, peritoneal diaylisis, seizures, monitor O2 during ambulation Restrictions Weight Bearing Restrictions: No Pain: Pain Assessment Pain Scale: 0-10 Pain Score: 0-No pain Pain Type: Chronic pain Pain Location: Head Pain Descriptors / Indicators: Headache Pain Onset: On-going Pain Intervention(s): Distraction   Exercises: General Exercises - Lower Extremity Ankle Circles/Pumps: AROM;15 reps;Both;Supine Short Arc Quad: Strengthening;Both;10 reps;Supine (5 sec hold) Heel Slides: Both;Strengthening;10 reps;Supine Hip ABduction/ADduction: Strengthening;Both;10 reps;Supine Straight Leg Raises: Strengthening;Both;10 reps;Supine Bridging both legs in supine x  10 w/ 5 sec hold   Therapy/Group: Individual Therapy  Reginia Naas  Magda Kiel, PT 07/07/2020, 10:57 AM

## 2020-07-08 DIAGNOSIS — D638 Anemia in other chronic diseases classified elsewhere: Secondary | ICD-10-CM | POA: Diagnosis not present

## 2020-07-08 DIAGNOSIS — R110 Nausea: Secondary | ICD-10-CM

## 2020-07-08 DIAGNOSIS — R5381 Other malaise: Secondary | ICD-10-CM | POA: Diagnosis not present

## 2020-07-08 DIAGNOSIS — I5042 Chronic combined systolic (congestive) and diastolic (congestive) heart failure: Secondary | ICD-10-CM | POA: Diagnosis not present

## 2020-07-08 DIAGNOSIS — G894 Chronic pain syndrome: Secondary | ICD-10-CM | POA: Diagnosis not present

## 2020-07-08 NOTE — Progress Notes (Signed)
PROGRESS NOTE   Subjective/Complaints: Patient seen sitting up in bed this morning.  She states she slept well after starting PD last night, but notes that it was not set up until late.  She notes she had some nausea and vomiting yesterday.  She also notes that she is having bowel movements.  Review of systems: + Nausea.  Denies CP, shortness of breath, diarrhea.  Objective:   No results found. Recent Labs    07/07/20 0448  WBC 6.3  HGB 10.6*  HCT 30.7*  PLT 294   Recent Labs    07/07/20 0448  NA 128*  K 3.0*  CL 92*  CO2 23  GLUCOSE 101*  BUN 19  CREATININE 4.76*  CALCIUM 8.6*    Intake/Output Summary (Last 24 hours) at 07/08/2020 0931 Last data filed at 07/08/2020 1740 Gross per 24 hour  Intake 443 ml  Output --  Net 443 ml        Physical Exam: Vital Signs Blood pressure 114/67, pulse 88, temperature 99.1 F (37.3 C), temperature source Oral, resp. rate 18, height 5\' 4"  (1.626 m), weight 51.2 kg, SpO2 98 %. Constitutional: No distress . Vital signs reviewed. HENT: Normocephalic.  Atraumatic. Eyes: EOMI. No discharge. Cardiovascular: No JVD.  RRR. Respiratory: Normal effort.  No stridor.  Bilateral clear to auscultation. GI: Non-distended.  BS +. Skin: Warm and dry.  Intact. Psych: Normal mood.  Normal behavior. Musc: No edema in extremities.  No tenderness in extremities. Neurologic: Alert Motor: 4/5 throughout, stable  Assessment/Plan: 1. Functional deficits which require 3+ hours per day of interdisciplinary therapy in a comprehensive inpatient rehab setting.  Physiatrist is providing close team supervision and 24 hour management of active medical problems listed below.  Physiatrist and rehab team continue to assess barriers to discharge/monitor patient progress toward functional and medical goals  Care Tool:  Bathing    Body parts bathed by patient: Right arm,Left arm,Chest,Abdomen,Front  perineal area,Right upper leg,Left upper leg,Face,Right lower leg,Left lower leg   Body parts bathed by helper: Buttocks     Bathing assist Assist Level: Minimal Assistance - Patient > 75%     Upper Body Dressing/Undressing Upper body dressing   What is the patient wearing?: Pull over shirt    Upper body assist Assist Level: Moderate Assistance - Patient 50 - 74%    Lower Body Dressing/Undressing Lower body dressing      What is the patient wearing?: Underwear/pull up,Pants     Lower body assist Assist for lower body dressing: Contact Guard/Touching assist     Toileting Toileting    Toileting assist Assist for toileting: Moderate Assistance - Patient 50 - 74%     Transfers Chair/bed transfer  Transfers assist     Chair/bed transfer assist level: Minimal Assistance - Patient > 75%     Locomotion Ambulation   Ambulation assist      Assist level: Moderate Assistance - Patient 50 - 74% Assistive device: Walker-rolling Max distance: 15'   Walk 10 feet activity   Assist  Walk 10 feet activity did not occur: Safety/medical concerns  Assist level: Moderate Assistance - Patient - 50 - 74% Assistive device: Walker-rolling  Walk 50 feet activity   Assist Walk 50 feet with 2 turns activity did not occur: Safety/medical concerns         Walk 150 feet activity   Assist Walk 150 feet activity did not occur: Safety/medical concerns         Walk 10 feet on uneven surface  activity   Assist Walk 10 feet on uneven surfaces activity did not occur: Safety/medical concerns         Wheelchair     Assist Will patient use wheelchair at discharge?: Yes Type of Wheelchair: Manual Wheelchair activity did not occur: Safety/medical concerns  Wheelchair assist level: Minimal Assistance - Patient > 75% Max wheelchair distance: 120'    Wheelchair 50 feet with 2 turns activity    Assist    Wheelchair 50 feet with 2 turns activity did not  occur: Safety/medical concerns   Assist Level: Minimal Assistance - Patient > 75%   Wheelchair 150 feet activity     Assist  Wheelchair 150 feet activity did not occur: Safety/medical concerns       Medical Problem List and Plan: 1.Functional and mobility deficitssecondary to debility after multiple medical issues-CHF, hx of C diff colitis, recent R hip fx 12/21 with ORIF  Continue CIR  2. Antithrombotics: -DVT/anticoagulation:Mechanical:Antiembolism stockings, knee (TED hose) Bilateral lower extremities Sequential compression devices, below kneeBilateral lower extremities -antiplatelet therapy: ASA/plavix 3.Chronic daily frequent HA/Pain Management:tylenol for basic pain -fioricet for breakthrough headaches (on this PTA)  Controlled with meds on 3/8 4. Mood:fairly positive. Team to provide ego support as needed -antipsychotic agents: n/a 5. Neuropsych: This patientiscapable of making decisions on herown behalf. 6. Skin/Wound Care:Routine pressure relief measures. 7. Fluids/Electrolytes/Nutrition:Monitor I/Os.  8. ESRD: Continue daily PD, dialysis doing well at this point. 9. CAD s/p PCI: On ASA/Plavix, metoprolol and lipitor. Monitor for symptoms with increase in activity.  10. New onset seizures: On Keppra bid Off Wellbutrin. Monitor for anxiety symptoms.  No breakthrough seizures since admission to CIR-3/8 11. H/o A flutter: Monitor HR tid--continue metoprolol. 12. Anemia of chronic disease: Monitor with serial H/H--improved post transfusion.  --On Aranesp weekly  Hemoglobin 10.6 on 3/7  Hemoccult negative   Continue to monitor 13. C diff colitis: Completed treatment with Dificid on 02/27 -off contact precautions 14. Chronic combined CHF: Takatsubo CM with EF--25-30% -- Monitor weights daily.  --On demadex bid with Kdur for supplement as well  as metoprolol and statin. Filed Weights   07/06/20 1120 07/07/20 0435 07/08/20 0450  Weight: 50.4 kg 50.1 kg 51.2 kg   Stable on 3/8 15.  Nausea, recent C. difficile colitis, end-stage renal disease  ?  Secondary to PD and electrolyte imbalance  KUB ordered 16.  Sleep disturbance   Used Ambien at home.  Discontinue, start as needed trazodone 17.  Hypoalbuminemia  Supplement initiated  LOS: 4 days A FACE TO FACE EVALUATION WAS PERFORMED  Clarece Drzewiecki Lorie Phenix 07/08/2020, 9:31 AM

## 2020-07-08 NOTE — Progress Notes (Signed)
Occupational Therapy Session Note  Patient Details  Name: Amy [name] MRN: [id number] Date of Birth: [birth year]  Today's Date: 07/08/2020 OT Individual Time: 1123-1206 OT Individual Time Calculation (min): 43 min    Short Term Goals: Week 1:  OT Short Term Goal 1 (Week 1): Pt will complete toilet transfer with CGA from w/c. OT Short Term Goal 2 (Week 1): Pt will complete 2/3 tasks of toileting with close S. OT Short Term Goal 3 (Week 1): Pt will complete standing ADL for >5 min with CGA. OT Short Term Goal 4 (Week 1): Pt will don pants with overall min A.  Skilled Therapeutic Interventions/Progress Updates:    Pt in bed to start session secondary to still being hooked up to peritoneal dialysis.  BP was taken in supine at 108/75 with pt not reporting any dizziness but some nausea, which has been ongoing.  She was able to transfer to the EOB with supervision and HOB elevated up to approximately 25 degrees.  Upon sitting she reported some light headedness with BP taken at 95/71 and HR 100.  She worked on donning her pants and gripper socks sitting on the EOB with supervision for socks and min assist for pants when standing to pull them up over her hips.  BP was taken during brief standing as well at 80/58.  Returned to sitting with therapist electing to work on UE exercises based on BP and pt still being hooked up to dialysis.  She was able to complete 2 sets of 6 reps for bilateral shoulder flexion using 4 lb dowel rod.  She then completed 2 sets of 15 reps for bilateral elbow flexion.  She transitioned back to supine with min guard for completion of 2 sets of 15 reps for chest press to finish exercises.  Nursing in to [facility] pt from dialysis.  Charge nurse made aware of  Low BP during session as well.  Call button and phone in reach with safety alarm in place.    Therapy Documentation Precautions:  Precautions Precautions: Fall Precaution Comments: generalized weakness, peritoneal diaylisis,  seizures, monitor O2 during ambulation Restrictions Weight Bearing Restrictions: No   Vital Signs: Therapy Vitals Temp: 97.9 F (36.6 C) Temp Source: Oral Pulse Rate: 95 Resp: 18 BP: 108/75 Patient Position (if appropriate): Lying Oxygen Therapy SpO2: 98 % O2 Device: Room Air Pain: Pain Assessment Pain Scale: 0-10 Pain Score: 0-No pain Pain Type: Chronic pain Pain Location: Head Pain Descriptors / Indicators: Headache ADL: See Care Tool Section for some details of mobility and selfcare  Therapy/Group: Individual Therapy  Javis Abboud OTR/L 07/08/2020, 12:15 PM

## 2020-07-08 NOTE — Progress Notes (Signed)
Physical Therapy Session Note  Patient Details  Name: Amy [name] MRN: [id number] Date of Birth: 06-16-1950  Today's Date: 07/08/2020 PT Individual Time: 1032-1103 PT Individual Time Calculation (min): 31 min   Short Term Goals: Week 1:  PT Short Term Goal 1 (Week 1): Pt will perform bed mobility with supervision. PT Short Term Goal 2 (Week 1): Pt will perform STS and SPVT transfers with CGA. PT Short Term Goal 3 (Week 1): Pt will ambulate at least 35 feet using RW with CGA PT Short Term Goal 4 (Week 1): Pt will perform 1 step using RW with Min A. Week 2:    Week 3:     Skilled Therapeutic Interventions/Progress Updates:    Pain:  Pt w/no c/o pain.  Treatment to tolerance.  Rest breaks and repositioning as needed.  Pt on HD therefore limited to session in room.  Pt initially supine and agreeable to treatment session w/focus on general strengthening and mobility. Supine to sit on edge of bed w/min assist.  Pt c/o mild dizzyness in sitting.  Performed bilat LE LAQs and ankle pumps as sx resolved. Sit to stand w/RW w/min assist.  Gait in room 51ft w/RW/4 turns w/min assist and therapist managing HD tubing.  Pt Sit to stand w/min assist and sidestepped x 4 to [facility].  Sit to supine w/rail and supervision, additional time. In supine performed: Heel slides, bridging, and clamshells x 10. Pt left supine w/rails up x 4, alarm set, bed in lowest position, and needs in reach.    Therapy Documentation Precautions:  Precautions Precautions: Fall Precaution Comments: generalized weakness, peritoneal diaylisis, seizures, monitor O2 during ambulation Restrictions Weight Bearing Restrictions: No    Therapy/Group: Individual Therapy  Callie Fielding, [place] 07/08/2020, 11:05 AM

## 2020-07-08 NOTE — Progress Notes (Signed)
Occupational Therapy Session Note  Patient Details  Name: Amy [name] MRN: [id number] Date of Birth: 29-Jun-1950  Today's Date: 07/08/2020 OT Individual Time: 3887-1959 OT Individual Time Calculation (min): 54 min    Short Term Goals: Week 1:  OT Short Term Goal 1 (Week 1): Pt will complete toilet transfer with CGA from w/c. OT Short Term Goal 2 (Week 1): Pt will complete 2/3 tasks of toileting with close S. OT Short Term Goal 3 (Week 1): Pt will complete standing ADL for >5 min with CGA. OT Short Term Goal 4 (Week 1): Pt will don pants with overall min A.   Skilled Therapeutic Interventions/Progress Updates:    Pt greeted at time of session supine semireclined in bed resting, stating she was feeling nauseous and did not feel up to session. With encouragement, agreeable to bed level exercises only. Declined ADl, toileting, getting OOB, or sitting EOB d/t nausea and RN aware. Bed level there ex with 3.5# dowel for 2x15 bicep curl, chest press, overhead press, and FWD circles with cues for form. Rest breaks required in between sets as well. Pt hyperverbal and required cues to redirect to tasks. Discussion/patient ed throughout session during rest breaks regarding DC planning, family assist after DC, and her concerns about her diet. Recommended talking to RD about dysphagia 3 diet. Pt in bed resting with alarm on call bell in reach.   Therapy Documentation Precautions:  Precautions Precautions: Fall Precaution Comments: generalized weakness, peritoneal diaylisis, seizures, monitor O2 during ambulation Restrictions Weight Bearing Restrictions: No    Therapy/Group: Individual Therapy  Viona Gilmore 07/08/2020, 7:29 AM

## 2020-07-08 NOTE — Progress Notes (Signed)
Physical Therapy Session Note  Patient Details  Name: Amy [name] MRN: [id number] Date of Birth: Apr 21, 1951  Today's Date: 07/08/2020 PT Individual Time: 3016-0109 PT Individual Time Calculation (min): 58 min   Short Term Goals: Week 1:  PT Short Term Goal 1 (Week 1): Pt will perform bed mobility with supervision. PT Short Term Goal 2 (Week 1): Pt will perform STS and SPVT transfers with CGA. PT Short Term Goal 3 (Week 1): Pt will ambulate at least 35 feet using RW with CGA PT Short Term Goal 4 (Week 1): Pt will perform 1 step using RW with Min A.  Skilled Therapeutic Interventions/Progress Updates:    Patient in supine and reports fatigued from prior sessions, but agreed to participate as able.  Got nausea medication earlier.  Performed toilet hygiene in supine with set up as on bedpan initially and pulled up underwear and pants with S.  Patient supine to sit with S with elevated HOB.  Reports low BP earlier today so checked in sitting 108/71.    Patient sit to stand to RW min A and ambulated to w/c 8' with min A.  Patient pushed in w/c to dayroom and performed TUG x 3 trials with RW and min A with average 58.3 sec.  Patient educated in fall risk and need for continued skilled PT.    Patient on Kinetron while in w/c pushing with legs 3 x 1 minute bouts at 40 cm/sec.  Patient assisted in w/c to therapy gym and performed ambulatory car transfer to sedan height vehicle.  Pushed in chair back to room but stopped to try out new RW as c/o veering when using it.  Patient sit to stand in room with  Min A and ambulated to bed 3' with different RW and min A.  Sit to supine with min A for LE.  Left with call bell and needs in reach with spouse in the room and bed alarm activated. Therapy Documentation Precautions:  Precautions Precautions: Fall Precaution Comments: generalized weakness, peritoneal diaylisis, seizures, monitor O2 during ambulation Restrictions Weight Bearing Restrictions:  No Pain: Pain Assessment Pain Scale: 0-10 Pain Score: 0-No pain Faces Pain Scale: Hurts little more Pain Type: Chronic pain Pain Location: Head Pain Descriptors / Indicators: Headache Pain Onset: On-going Pain Intervention(s): RN made aware   Therapy/Group: Individual Therapy  Reginia Naas  [place], [place] 07/08/2020, 3:13 PM

## 2020-07-09 ENCOUNTER — Inpatient Hospital Stay: Payer: Medicare Other | Admitting: Family Medicine

## 2020-07-09 ENCOUNTER — Inpatient Hospital Stay (HOSPITAL_COMMUNITY): Payer: Medicare Other

## 2020-07-09 DIAGNOSIS — I5042 Chronic combined systolic (congestive) and diastolic (congestive) heart failure: Secondary | ICD-10-CM | POA: Diagnosis not present

## 2020-07-09 DIAGNOSIS — G894 Chronic pain syndrome: Secondary | ICD-10-CM | POA: Diagnosis not present

## 2020-07-09 DIAGNOSIS — D638 Anemia in other chronic diseases classified elsewhere: Secondary | ICD-10-CM | POA: Diagnosis not present

## 2020-07-09 DIAGNOSIS — R5381 Other malaise: Secondary | ICD-10-CM | POA: Diagnosis not present

## 2020-07-09 MED ORDER — DELFLEX-LC/1.5% DEXTROSE 344 MOSM/L IP SOLN
INTRAPERITONEAL | Status: DC
  Administered 2020-07-10: 5000 mL via INTRAPERITONEAL

## 2020-07-09 NOTE — Progress Notes (Signed)
Physical Therapy Session Note  Patient Details  Name: Amy [name] MRN: [id number] Date of Birth: 15-Sep-1950  Today's Date: 07/09/2020 PT Individual Time: 1334-1445 PT Individual Time Calculation (min): 71 min   Short Term Goals: Week 1:  PT Short Term Goal 1 (Week 1): Pt will perform bed mobility with supervision. PT Short Term Goal 2 (Week 1): Pt will perform STS and SPVT transfers with CGA. PT Short Term Goal 3 (Week 1): Pt will ambulate at least 35 feet using RW with CGA PT Short Term Goal 4 (Week 1): Pt will perform 1 step using RW with Min A.  Skilled Therapeutic Interventions/Progress Updates:    Patient in supine and reports continued nausea, but just got a shot for it.  Reports she has been busy today.  Supine to sit with S.  Seated to don pants with S threading legs, then sit to stand min A to pull up pants.  Patient ambulated with RW and min A x 77' cues for self monitoring to avoid overfatigue.  Patient in w/c assisted to dayroom and performed Berg balance test as noted below.  Patient sit to supine on mat min A and performed therex as noted below. Supine to sit S with cues for technique, pt c/o light headed and BP measured as noted:  Orthostatic VS for the past 24 hrs (Last 3 readings):  BP- Sitting BP- Standing at 0 minutes Pulse- Standing at 0 minutes  07/09/20 1410 90/65 (!) 86/66 110   Patient sit to stand and step to w/c with RW min A.  Patient pushed in chair to room and performed sit to stand and step to recliner with min A.  Left with legs elevated in recliner with chair alarm active, and needs/call bell in reach.  Therapy Documentation Precautions:  Precautions Precautions: Fall Precaution Comments: generalized weakness, peritoneal diaylisis, seizures, monitor O2 during ambulation Restrictions Weight Bearing Restrictions: No Pain: Pain Assessment Pain Score: 0-No pain Balance: Balance Balance Assessed: Yes Standardized Balance Assessment Standardized Balance  Assessment: Berg Balance Test Berg Balance Test Sit to Stand: Able to stand  independently using hands Standing Unsupported: Able to stand 30 seconds unsupported Sitting with Back Unsupported but Feet Supported on Floor or Stool: Able to sit safely and securely 2 minutes Stand to Sit: Uses backs of legs against chair to control descent Transfers: Needs one person to assist Standing Unsupported with Eyes Closed: Able to stand 3 seconds Standing Ubsupported with Feet Together: Needs help to attain position and unable to hold for 15 seconds From Standing, Reach Forward with Outstretched Arm: Can reach forward >5 cm safely (2") From Standing Position, Pick up Object from Floor: Unable to try/needs assist to keep balance From Standing Position, Turn to Look Behind Over each Shoulder: Needs supervision when turning Turn 360 Degrees: Needs assistance while turning Standing Unsupported, Alternately Place Feet on Step/Stool: Needs assistance to keep from falling or unable to try Standing Unsupported, One Foot in Front: Needs help to step but can hold 15 seconds Standing on One Leg: Unable to try or needs assist to prevent fall Total Score: 18 Exercises: General Exercises - Lower Extremity Hip ABduction/ADduction: Strengthening;Both;10 reps;Supine Straight Leg Raises: 10 reps;Strengthening;Supine;Both Total Joint Exercises Towel Squeeze: Strengthening;Both;10 reps;Supine (5 sec hold) Bridges: Strengthening;Both;10 reps (x 2 sets w/ 5 sec hold) Other Exercises Other Exercises: sidelying clamshell x 10 reps (w/ 5 sec hold)   Therapy/Group: Individual Therapy  Reginia Naas  [place], PT 07/09/2020, 2:21 PM

## 2020-07-09 NOTE — Progress Notes (Signed)
Patient ID: Amy [name], female   DOB: Mar 17, 1951, 70 y.o.   MRN: [id number] Met with pt to discuss team conference goals supervision level and target discharge date 3/21. Pt voiced she had hopes she could get to mod/i but understands the team's recommendation and safety concern of her being home alone. She will talk with husband and daughter regarding this. She has [place] in place from hip surgery.

## 2020-07-09 NOTE — Progress Notes (Signed)
Amy [name]  Subjective: seen in room, no c/o today  Vitals:   07/09/20 0500 07/09/20 0552 07/09/20 0559 07/09/20 0700  BP:  114/72 112/66 113/64  Pulse:  90 90 90  Resp:  16  16  Temp:  98.3 F (36.8 C) 98.5 F (36.9 C) 98.6 F (37 C)  TempSrc:    Oral  SpO2:  99% 99% 98%  Weight: 46.4 kg   46 kg  Height:        Exam: General: chronically ill appearing female in NAD Heart: RRR, no mrg Lungs: mostly CTAB, no wheezes, rales or rhonchi rm air Abdomen: soft, NTND, PD cath LLQ intact Extrem: 1+ hip edema trace pretibial  Neuro: NF, o x4    OP PD: Amy [name] followed by Amy [name]. 4 exchanges overnight, 2000 cc and 1.5 hrs each.     Assessment/ Plan: 1. Multivessel CAD with reduced EF 25/30%: HF team following.Eval by TCTS, is"poor CABG candidate". SP PCI to LAD with DESon 2/25.getting Plavix and ASA 81 mg,+ statin 2. Debility: 2/2 Multiple med issues mainly CAD > now on CIR 3. ESRD -stable on PD, torsemide 100 bid and po KCL 10 bid.  4. HTN/volume -BPin goal,on metoprolol 25 mg daily. When using 2.5% pt c/o feeling "dizzy" > so now doing all 1.5% bags. No vol^ by exam.  5. Anemia -Hgb 9- 11 range. s/p 1unitPRBCon 2/20 and on 2/27. Continue Aranesp 100 MCG q Saturday. tsat 91%,ferritin 1131. No need for iron at this time. 6. Secondary hyperparathyroidism: no binder listed, on Sensipar 30/d,stoppedRocaltrol with elevatedcorrectedcalcium>10, last phos check was 3 7. Hyponatremia -131 NA, chronic  8. Nutrition: Currently on dysphagia 3 diet.Albumin low at 1.4>1.5 , noted on Ensure 3 times daily,follow labs and change to renal diet if K or phos start to trend up.  9. Seizures - do to electrolyte abnormality in setting of C diff. Now on Keppra with neruo f/u.fu labs  10. C diff - finished ABX course on 2/27. 11. Disposition-CIR admit for rehab   Amy [name] 07/09/2020, 12:02 PM   Recent Labs  Lab  07/03/20 0301 07/07/20 0448  K 4.0 3.0*  BUN 27* 19  CREATININE 4.71* 4.76*  CALCIUM 8.9 8.6*  PHOS 3.0 3.7  HGB  --  10.6*   Inpatient medications: . aspirin  81 mg Oral Daily  . atorvastatin  80 mg Oral q1800  . Chlorhexidine Gluconate Cloth  6 each Topical Daily  . cinacalcet  30 mg Oral Q breakfast  . clopidogrel  75 mg Oral Daily  . darbepoetin (ARANESP) injection - NON-DIALYSIS  100 mcg Subcutaneous Q Sat-1800  . feeding supplement  1 Container Oral TID BM  . gentamicin cream  1 application Topical Daily  . levETIRAcetam  500 mg Oral BID  . metoprolol succinate  25 mg Oral Daily  . multivitamin  1 tablet Oral QHS  . pantoprazole  40 mg Oral Daily  . potassium chloride  10 mEq Oral Daily  . sodium chloride flush  3 mL Intravenous Q12H  . torsemide  100 mg Oral BID   . dialysis solution 1.5% low-MG/low-CA     acetaminophen, aluminum hydroxide, bisacodyl, butalbital-acetaminophen-caffeine, diphenhydrAMINE, Gerhardt's butt cream, guaiFENesin-dextromethorphan, dianeal solution for CAPD/[name] with heparin, milk and molasses, ondansetron **OR** ondansetron (ZOFRAN) IV, polyethylene glycol, zolpidem

## 2020-07-09 NOTE — Progress Notes (Signed)
Occupational Therapy Session Note  Patient Details  Name: Amy [name] MRN: [id number] Date of Birth: October 03, 1950  Today's Date: 07/09/2020 OT Individual Time: 1130-1200 OT Individual Time Calculation (min): 30 min    Short Term Goals: Week 1:  OT Short Term Goal 1 (Week 1): Pt will complete toilet transfer with CGA from w/c. OT Short Term Goal 2 (Week 1): Pt will complete 2/3 tasks of toileting with close S. OT Short Term Goal 3 (Week 1): Pt will complete standing ADL for >5 min with CGA. OT Short Term Goal 4 (Week 1): Pt will don pants with overall min A.  Skilled Therapeutic Interventions/Progress Updates:    1;1. Pt received in recliner agreeable to OT. Pt frustrated by inability to use hands to press hairspray and wanting to work on grip strength. Pt requires MOD VC for completion of exercises correctly.  Access Code: [email or web address] URL: [email or web address] Date: 07/09/2020 Prepared by: Mariane Masters  Exercises Putty Squeezes - 1 x daily - 7 x weekly - 3 sets - 10 reps Rolling Putty on Table - 1 x daily - 7 x weekly - 3 sets - 10 reps Tip Pinch with Putty - 1 x daily - 7 x weekly - 3 sets - 10 reps Key Pinch with Putty - 1 x daily - 7 x weekly - 3 sets - 10 reps Finger Pinch and Pull with Putty - 1 x daily - 7 x weekly - 3 sets - 10 reps Seated Finger Extension with Putty - 1 x daily - 7 x weekly - 3 sets - 10 reps Stand pivot transfer to bed with RW and MIN A  Exited session with pt seated in bed, exit alarm on and call light in reach   Therapy Documentation Precautions:  Precautions Precautions: Fall Precaution Comments: generalized weakness, peritoneal diaylisis, seizures, monitor O2 during ambulation Restrictions Weight Bearing Restrictions: No General:   Vital Signs:   Pain:   ADL: ADL Eating: Supervision/safety Where Assessed-Eating: Edge of bed Grooming: Supervision/safety Where Assessed-Grooming: Sitting at sink Upper Body Bathing:  Supervision/safety Where Assessed-Upper Body Bathing: Edge of bed,Sitting at sink Lower Body Bathing: Minimal assistance Where Assessed-Lower Body Bathing: Sitting at sink,Edge of bed Upper Body Dressing: Supervision/safety Where Assessed-Upper Body Dressing: Edge of bed,Sitting at sink Lower Body Dressing: Moderate assistance Where Assessed-Lower Body Dressing: Edge of bed,Sitting at sink Toileting: Moderate assistance Where Assessed-Toileting: Glass blower/designer: Psychiatric nurse Method: Arts development officer: Radiographer, therapeutic: Minimal Museum/gallery conservator Method: Stand pivot Tub/Shower Equipment: Shower seat with back,Grab bars,Walk in Librarian, academic    Praxis   Exercises:   Other Treatments:     Therapy/Group: Individual Therapy  Tonny Branch 07/09/2020, 12:31 PM

## 2020-07-09 NOTE — Progress Notes (Signed)
Occupational Therapy Session Note  Patient Details  Name: Amy [name] MRN: [id number] Date of Birth: 1951/02/17  Today's Date: 07/09/2020 OT Individual Time: 1030-1130 OT Individual Time Calculation (min): 60 min    Short Term Goals: Week 1:  OT Short Term Goal 1 (Week 1): Pt will complete toilet transfer with CGA from w/c. OT Short Term Goal 2 (Week 1): Pt will complete 2/3 tasks of toileting with close S. OT Short Term Goal 3 (Week 1): Pt will complete standing ADL for >5 min with CGA. OT Short Term Goal 4 (Week 1): Pt will don pants with overall min A.  Skilled Therapeutic Interventions/Progress Updates:    1;1. Pt received in bed agreeable to OT reporting nausea but willing to participate as able in room. Pt completes standpivot trasnfers throughout session with MIN A using RW and no LOB but req VC for hand placement. Pt selects clothing from dresser with cuing to lock w/c brake prior to reaching low. Pt grooms at sink with se tup for oral care and OT washes hair with hair washing tray. Pt styles hair with A to dispense moose d/t finger weakness but blow dries hair with set up for BUE endurance/strengthening. Pt completes toileting and transfer with grab bar and MIN A overall for power up from toilet as well as A to balance during clothing management. Pt changes shirt with S and returns to bed. Exited session with pt seated in bed, exit alarm on and call light in reach    Therapy Documentation Precautions:  Precautions Precautions: Fall Precaution Comments: generalized weakness, peritoneal diaylisis, seizures, monitor O2 during ambulation Restrictions Weight Bearing Restrictions: No General:   Vital Signs: Therapy Vitals Temp: 98.5 F (36.9 C) Pulse Rate: 90 Resp: 16 BP: 112/66 Patient Position (if appropriate): Lying Oxygen Therapy SpO2: 99 % O2 Device: Room Air Pain:   ADL: ADL Eating: Supervision/safety Where Assessed-Eating: Edge of bed Grooming:  Supervision/safety Where Assessed-Grooming: Sitting at sink Upper Body Bathing: Supervision/safety Where Assessed-Upper Body Bathing: Edge of bed,Sitting at sink Lower Body Bathing: Minimal assistance Where Assessed-Lower Body Bathing: Sitting at sink,Edge of bed Upper Body Dressing: Supervision/safety Where Assessed-Upper Body Dressing: Edge of bed,Sitting at sink Lower Body Dressing: Moderate assistance Where Assessed-Lower Body Dressing: Edge of bed,Sitting at sink Toileting: Moderate assistance Where Assessed-Toileting: Glass blower/designer: Psychiatric nurse Method: Arts development officer: Radiographer, therapeutic: Minimal Museum/gallery conservator Method: Stand pivot Tub/Shower Equipment: Shower seat with back,Grab bars,Walk in Librarian, academic    Praxis   Exercises:   Other Treatments:     Therapy/Group: Individual Therapy  Tonny Branch 07/09/2020, 6:51 AM

## 2020-07-09 NOTE — Progress Notes (Signed)
PROGRESS NOTE   Subjective/Complaints: Patient seen sitting up in bed this morning.  She states she slept better overnight.  Daughter at bedside, with questions regarding x-ray.  Patient states that her nausea is improved.  Daughter states that this is chronic in nature, patient appears to disagree. There appears to be some discrepancy with husband as well, discussed with Herbalist.   Review of systems: + Nausea,?  Improving.  Denies CP, shortness of breath, diarrhea.  Objective:   No results found. Recent Labs    07/07/20 0448  WBC 6.3  HGB 10.6*  HCT 30.7*  PLT 294   Recent Labs    07/07/20 0448  NA 128*  K 3.0*  CL 92*  CO2 23  GLUCOSE 101*  BUN 19  CREATININE 4.76*  CALCIUM 8.6*    Intake/Output Summary (Last 24 hours) at 07/09/2020 1025 Last data filed at 07/09/2020 0837 Gross per 24 hour  Intake 320 ml  Output --  Net 320 ml        Physical Exam: Vital Signs Blood pressure 113/64, pulse 90, temperature 98.6 F (37 C), temperature source Oral, resp. rate 16, height 5\' 4"  (1.626 m), weight 46 kg, SpO2 98 %. Constitutional: No distress . Vital signs reviewed. HENT: Normocephalic.  Atraumatic. Eyes: EOMI. No discharge. Cardiovascular: No JVD.  RRR. Respiratory: Normal effort.  No stridor.  Bilateral clear to auscultation. GI: Non-distended.  BS +. Skin: Warm and dry.  Intact. Psych: Normal mood.  Normal behavior. Musc: No edema in extremities.  No tenderness in extremities. Neurologic: Alert Motor: 4/5 throughout, unchanged  Assessment/Plan: 1. Functional deficits which require 3+ hours per day of interdisciplinary therapy in a comprehensive inpatient rehab setting.  Physiatrist is providing close team supervision and 24 hour management of active medical problems listed below.  Physiatrist and rehab team continue to assess barriers to discharge/monitor patient progress toward functional and  medical goals  Care Tool:  Bathing    Body parts bathed by patient: Right arm,Left arm,Chest,Abdomen,Front perineal area,Right upper leg,Left upper leg,Face,Right lower leg,Left lower leg   Body parts bathed by helper: Buttocks     Bathing assist Assist Level: Minimal Assistance - Patient > 75%     Upper Body Dressing/Undressing Upper body dressing   What is the patient wearing?: Pull over shirt    Upper body assist Assist Level: Moderate Assistance - Patient 50 - 74%    Lower Body Dressing/Undressing Lower body dressing      What is the patient wearing?: Pants     Lower body assist Assist for lower body dressing: Minimal Assistance - Patient > 75%     Toileting Toileting    Toileting assist Assist for toileting: Minimal Assistance - Patient > 75%     Transfers Chair/bed transfer  Transfers assist     Chair/bed transfer assist level: Minimal Assistance - Patient > 75%     Locomotion Ambulation   Ambulation assist      Assist level: Minimal Assistance - Patient > 75% Assistive device: Walker-rolling Max distance: 20   Walk 10 feet activity   Assist  Walk 10 feet activity did not occur: Safety/medical concerns  Assist level: Minimal  Assistance - Patient > 75% Assistive device: Walker-rolling   Walk 50 feet activity   Assist Walk 50 feet with 2 turns activity did not occur: Safety/medical concerns         Walk 150 feet activity   Assist Walk 150 feet activity did not occur: Safety/medical concerns         Walk 10 feet on uneven surface  activity   Assist Walk 10 feet on uneven surfaces activity did not occur: Safety/medical concerns         Wheelchair     Assist Will patient use wheelchair at discharge?: Yes Type of Wheelchair: Manual Wheelchair activity did not occur: Safety/medical concerns  Wheelchair assist level: Minimal Assistance - Patient > 75% Max wheelchair distance: 120'    Wheelchair 50 feet with 2  turns activity    Assist    Wheelchair 50 feet with 2 turns activity did not occur: Safety/medical concerns   Assist Level: Minimal Assistance - Patient > 75%   Wheelchair 150 feet activity     Assist  Wheelchair 150 feet activity did not occur: Safety/medical concerns       Medical Problem List and Plan: 1.Functional and mobility deficitssecondary to debility after multiple medical issues-CHF, hx of C diff colitis, recent R hip fx 12/21 with ORIF  Continue CIR   Team conference today to discuss current and goals and coordination of care, home and environmental barriers, and discharge planning with nursing, case manager, and therapies. Please see conference note from today as well.  2. Antithrombotics: -DVT/anticoagulation:Mechanical:Antiembolism stockings, knee (TED hose) Bilateral lower extremities Sequential compression devices, below kneeBilateral lower extremities -antiplatelet therapy: ASA/plavix 3.Chronic daily frequent HA/Pain Management:tylenol for basic pain -fioricet for breakthrough headaches (on this PTA)  Controlled with meds on 3/9 4. Mood:fairly positive. Team to provide ego support as needed -antipsychotic agents: n/a 5. Neuropsych: This patientiscapable of making decisions on herown behalf. 6. Skin/Wound Care:Routine pressure relief measures. 7. Fluids/Electrolytes/Nutrition:Monitor I/Os.  8. ESRD: Continue daily PD, dialysis doing well at this point. 9. CAD s/p PCI: On ASA/Plavix, metoprolol and lipitor. Monitor for symptoms with increase in activity.  10. New onset seizures: On Keppra bid Off Wellbutrin. Monitor for anxiety symptoms.  No breakthrough seizures since admission to CIR- 3/9 11. H/o A flutter: Monitor HR tid--continue metoprolol. 12. Anemia of chronic disease: Monitor with serial H/H--improved post transfusion.  --On Aranesp weekly  Hemoglobin 10.6  on 3/7  Hemoccult negative   Continue to monitor 13. C diff colitis: Completed treatment with Dificid on 02/27 -off contact precautions 14. Chronic combined CHF: Takatsubo CM with EF--25-30% -- Monitor weights daily.  --On demadex bid with Kdur for supplement as well as metoprolol and statin. Filed Weights   07/08/20 1613 07/09/20 0500 07/09/20 0700  Weight: 46.2 kg 46.4 kg 46 kg   Stable on 3/9 15.  Nausea, recent C. difficile colitis, end-stage renal disease  ?  Secondary to PD and electrolyte imbalance  KUB ordered, pending 16.  Sleep disturbance   Used Ambien at home.  Discontinue, start as needed trazodone 17.  Hypoalbuminemia  Supplement initiated  LOS: 5 days A FACE TO FACE EVALUATION WAS PERFORMED  Sonam Huelsmann Lorie Phenix 07/09/2020, 10:25 AM

## 2020-07-09 NOTE — Progress Notes (Signed)
Nutrition Follow-up  DOCUMENTATION CODES:   Severe malnutrition in context of chronic illness  INTERVENTION:   Given severe malnutrition, significant weight loss, and ongoing poor PO intake, recommend Cortrak placement and initiation of enteral nutrition.  - Continue Boost Breeze po TID, each supplement provides 250 kcal and 9 grams of protein (wild berry flavor)  - Magic Cup TID with meals, each supplement provides 290 kcal and 9 grams of protein  - Chocolate milk TID with meals per pt request  - Continue renal MVI daily  - Encourage adequate PO intake  NUTRITION DIAGNOSIS:   Severe Malnutrition related to chronic illness (ESRD, CHF) as evidenced by severe fat depletion,severe muscle depletion.  Ongoing  GOAL:   Patient will meet greater than or equal to 90% of their needs  Unmet  MONITOR:   PO intake,Supplement acceptance,Labs,Weight trends,I & O's  REASON FOR ASSESSMENT:   Other (history of malnutrition)    ASSESSMENT:   70 year old female with PMH of ESRD on PD, HTN, gastritis, anxiety, right hip fracture s/p ORIF 04/2020, recent admission for diarrhea (found to be COVID+ also) due to C diff colitis. Pt was was readmitted on 06/19/20 with mental status changes due to brief seizure from multiple metabolic derangements related to volume depletion. Pt found to have Takotsubo cardiomyopathy and combined CHF. Pt underwent cardiac cath revealing severe multivessel disease and underwent PCI 2/25 as not felt to be CABG candidate. Admitted to CIR on 3/04.  PD ongoing with 4 exchanges of 1.5% overnight, 2000 ml and 1.5 hours each.  CIR admit weight: 49.8 kg Current weight: 43.4 kg  Pt with a 6.4 kg weight loss since admission to CIR. This is a 12.9% weight loss in less than 1 month which is severe and significant for timeframe.  Pt continues to have edema to BLE. Suspect edema is masking pt's true dry weight.  Spoke with pt at bedside in between therapy sessions. Pt  reports that she ate cereal for breakfast this morning. She reports ongoing nausea which is not helped by Zofran. She feels like the nausea is affecting her appetite.  Pt reports that she does not like the [employer] or [employer]. She is willing to consume a little bit of the Boost Breeze and will eat some of the Magic Cups if she can mix them with orange sherbet. Pt requesting chocolate milk with meals. RD will order these things via [place].  Given severe malnutrition, significant weight loss, and ongoing poor PO intake, recommend Cortrak placement and initiation of enteral nutrition.  Meal Completion: 0-75%  Medications reviewed and include: sensipar, aranesp, Boost Breeze TID, rena-vit, protonix, klor-con, torsemide  Labs reviewed: sodium 128, potassium 3.0  Diet Order:   Diet Order            Diet regular Room service appropriate? Yes; Fluid consistency: Thin  Diet effective now                 EDUCATION NEEDS:   Education needs have been addressed  Skin:  Skin Assessment: Reviewed RN Assessment  Last BM:  07/09/20 small type 6  Height:   Ht Readings from Last 1 Encounters:  07/05/20 5\' 4"  (1.626 m)    Weight:   Wt Readings from Last 1 Encounters:  07/10/20 43.4 kg    BMI:  Body mass index is 16.42 kg/m.  Estimated Nutritional Needs:   Kcal:  1700-1900  Protein:  85-100 grams  Fluid:  1000 ml + UOP  Gustavus Bryant, MS, RD, LDN Inpatient Clinical Dietitian Please see [facility] for contact information.

## 2020-07-09 NOTE — Progress Notes (Signed)
Pt has PRN Ambien, not effective. Pt appears to be anxious. Pt denies being in pain. Pt resting in  Bed at this time. Call light in reach

## 2020-07-09 NOTE — Patient Care Conference (Signed)
Inpatient RehabilitationTeam Conference and Plan of Care Update Date: 07/09/2020   Time: 11:48 AM    Patient Name: Amy [name]      Medical Record Number: [id number]  Date of Birth: 10/21/1950 Sex: Female         Room/Bed: 4W10C/4W10C-01 Payor Info: Payor: MEDICARE / Plan: MEDICARE PART A AND B / Product Type: *No Product type* /    Admit Date/Time:  07/04/2020  6:17 PM  Primary Diagnosis:  [place] [facility] Problems: Principal Problem:   Debility Active Problems:   Hypoalbuminemia due to protein-calorie malnutrition (HCC)   Nausea and vomiting   Chronic combined systolic and diastolic CHF (congestive heart failure) (HCC)   Anemia of chronic disease   Seizures (HCC)   Chronic pain syndrome   Nausea    Expected Discharge Date: Expected Discharge Date: 07/23/20  Team Members Present: Physician leading conference: Dr. Delice Lesch Care Coodinator Present: Dorien Chihuahua, RN, BSN, CRRN;Becky Dupree, LCSW Nurse Present: Other (comment) Pietro Cassis, RN) PT Present: Magda Kiel, PT OT Present: Elisabeth Most, OT PPS Coordinator present : Gunnar Fusi, Novella Olive, PT     Current Status/Progress Goal Weekly Team Focus  Bowel/Bladder   Pt is continent of b/b. LBM 07/08/20  pt will remain continent of b/b  Q2h toileting,.   Swallow/Nutrition/ Hydration             ADL's   Supervision for UB selfcare, min assist for LB selfcare sit to stand.  Min assist for transfers with use of the RW  supervision to modified independent  selfcare retraining, therapeutic exercise, balance retraining, transfer training, DME education.   Mobility   min to mod A transfers depends on surface height, gait 15' min to mod A RW, w/c mobility min A x 120'  S overall, except CGA stairs  activity tolerance, LE strength, transfers, gait endurance, safety   Communication             Safety/Cognition/ Behavioral Observations            Pain   Pt denies pain at this time.  pt will remain free of pain.   Assess pain qshift/PRN   Skin   bilateral bruising to arms  pt will be free from breakdown and infection  assess skin qshift/prn     Discharge Planning:  Home with husband who works in [place] M-F and daughter is self employed, will be alone during the day. Will need to be mod/i to be safe home alone   Team Discussion: Nausea; reported as chronic issue, KUB completed. Continent of bowel and bladder. Progress limited by poor activity tolerance, poor safety awareness and lack of awareness of deficits and need for assistance.  Patient on target to meet rehab goals: yes, currently min - mod assist for transfers, supervision for upper body care and min assist for lower body care with supervision goals; CGA for steps set for discharge.  *See Care Plan and progress notes for long and short-term goals.   Revisions to Treatment Plan:   Teaching Needs: Transfers, safety, toileting, medications, etc.   Current Barriers to Discharge: Decreased caregiver support  Possible Resolutions to Barriers: Family education    Medical Summary Current Status: Functional and mobility deficits secondary to debility after multiple medical issues-CHF, hx of C diff colitis, recent R hip fx 12/21 with ORIF  Barriers to Discharge: Other (comments);Medical stability;Behavior;Decreased family/caregiver support  Barriers to Discharge Comments: +PD Possible Resolutions to [employer] Focus: Therapies, optimize nausea meds, new onset  seizures, optimize pain meds, follow labs - Hb, follow weights   Continued Need for Acute Rehabilitation Level of Care: The patient requires daily medical management by a physician with specialized training in physical medicine and rehabilitation for the following reasons: Direction of a multidisciplinary physical rehabilitation program to maximize functional independence : Yes Medical management of patient stability for increased activity during participation in an intensive  rehabilitation regime.: Yes Analysis of laboratory values and/or radiology reports with any subsequent need for medication adjustment and/or medical intervention. : Yes   I attest that I was present, lead the team conference, and concur with the assessment and plan of the team.   Margarito Liner 07/09/2020, 3:24 PM

## 2020-07-10 DIAGNOSIS — G894 Chronic pain syndrome: Secondary | ICD-10-CM | POA: Diagnosis not present

## 2020-07-10 DIAGNOSIS — D638 Anemia in other chronic diseases classified elsewhere: Secondary | ICD-10-CM | POA: Diagnosis not present

## 2020-07-10 DIAGNOSIS — R5381 Other malaise: Secondary | ICD-10-CM | POA: Diagnosis not present

## 2020-07-10 DIAGNOSIS — I5042 Chronic combined systolic (congestive) and diastolic (congestive) heart failure: Secondary | ICD-10-CM | POA: Diagnosis not present

## 2020-07-10 MED ORDER — METOCLOPRAMIDE HCL 5 MG PO TABS
5.0000 mg | ORAL_TABLET | Freq: Two times a day (BID) | ORAL | Status: DC
  Administered 2020-07-10 – 2020-07-12 (×4): 5 mg via ORAL
  Filled 2020-07-10 (×4): qty 1

## 2020-07-10 MED ORDER — DARBEPOETIN ALFA 100 MCG/0.5ML IJ SOSY
100.0000 ug | PREFILLED_SYRINGE | INTRAMUSCULAR | Status: DC
  Administered 2020-07-19: 100 ug via SUBCUTANEOUS
  Filled 2020-07-10: qty 0.5

## 2020-07-10 MED ORDER — BUTALBITAL-APAP-CAFFEINE 50-325-40 MG PO TABS
1.0000 | ORAL_TABLET | Freq: Four times a day (QID) | ORAL | Status: DC | PRN
  Administered 2020-07-10 – 2020-07-19 (×26): 2 via ORAL
  Administered 2020-07-19: 1 via ORAL
  Administered 2020-07-20 – 2020-07-22 (×7): 2 via ORAL
  Administered 2020-07-22: 1 via ORAL
  Administered 2020-07-22 – 2020-07-23 (×2): 2 via ORAL
  Administered 2020-07-23: 1 via ORAL
  Administered 2020-07-23 (×2): 2 via ORAL
  Administered 2020-07-24: 1 via ORAL
  Administered 2020-07-24: 2 via ORAL
  Filled 2020-07-10 (×11): qty 2
  Filled 2020-07-10: qty 1
  Filled 2020-07-10 (×4): qty 2
  Filled 2020-07-10: qty 1
  Filled 2020-07-10: qty 2
  Filled 2020-07-10: qty 1
  Filled 2020-07-10 (×5): qty 2
  Filled 2020-07-10: qty 1
  Filled 2020-07-10: qty 2
  Filled 2020-07-10: qty 1
  Filled 2020-07-10: qty 2
  Filled 2020-07-10: qty 1
  Filled 2020-07-10 (×2): qty 2
  Filled 2020-07-10 (×2): qty 1
  Filled 2020-07-10: qty 2
  Filled 2020-07-10: qty 1
  Filled 2020-07-10 (×2): qty 2
  Filled 2020-07-10 (×2): qty 1
  Filled 2020-07-10 (×2): qty 2
  Filled 2020-07-10: qty 1
  Filled 2020-07-10 (×3): qty 2
  Filled 2020-07-10: qty 1
  Filled 2020-07-10 (×3): qty 2

## 2020-07-10 NOTE — Progress Notes (Signed)
Physical Therapy Session Note  Patient Details  Name: Amy [name] MRN: [id number] Date of Birth: 1950-12-05  Today's Date: 07/10/2020 PT Individual Time: 1102-1159 PT Individual Time Calculation (min): 57 min   Short Term Goals: Week 1:  PT Short Term Goal 1 (Week 1): Pt will perform bed mobility with supervision. PT Short Term Goal 2 (Week 1): Pt will perform STS and SPVT transfers with CGA. PT Short Term Goal 3 (Week 1): Pt will ambulate at least 35 feet using RW with CGA PT Short Term Goal 4 (Week 1): Pt will perform 1 step using RW with Min A.  Skilled Therapeutic Interventions/Progress Updates: Pt presents supine in bed but agreeable to participate in therapy.  Pt able to don pants and slipper socks in long sitting .  Pt states nausea throughout the AM and during rx session.  Pt transfers sup to sit w/ supervision but slowly.  Pt transfers sit to stand w/ min A and verbal cues.  Pt amb to sink to brush teeth w/ min to CGA.  Pt tolerated LE there e for calf raises, LAQ, hip flexion and abd/add 3 x 15.  Pt amb multiple trials up to 59-60' w/ CGA and RW.  Pt requires seated rest breaks between trials as well as occasional standing rest break. 2/2 fatigue.  Pt amb to bed and required CGA for sit to supine for LEs.  Bed alarm on and all needs in reach.     Therapy Documentation Precautions:  Precautions Precautions: Fall Precaution Comments: generalized weakness, peritoneal diaylisis, seizures, monitor O2 during ambulation Restrictions Weight Bearing Restrictions: No General:   Vital Signs: Therapy Vitals Pulse Rate: 86 BP: 110/70 Pain: 0/10, nausea only.   Mobility:      Therapy/Group: Individual Therapy  Amy [name] 07/10/2020, 12:00 PM

## 2020-07-10 NOTE — Progress Notes (Signed)
Occupational Therapy Session Note  Patient Details  Name: RITHIKA SEEL MRN: [id number] Date of Birth: 1950-07-15  Today's Date: 07/10/2020 OT Individual Time: 1550-1630 OT Individual Time Calculation (min): 40 min    Short Term Goals: Week 1:  OT Short Term Goal 1 (Week 1): Pt will complete toilet transfer with CGA from w/c. OT Short Term Goal 2 (Week 1): Pt will complete 2/3 tasks of toileting with close S. OT Short Term Goal 3 (Week 1): Pt will complete standing ADL for >5 min with CGA. OT Short Term Goal 4 (Week 1): Pt will don pants with overall min A.   Skilled Therapeutic Interventions/Progress Updates:    Pt greeted at time of session supine in bed resting, agreeable to OT session but stating she is worn out from previous therapy sessions today but agreeable to try. Time spent at beginning of session discussing CLOF, DC date, and pt wanting to go home sooner than scheduled date. Pt stating her husband will work from home, his office is upstairs and she would be downstairs but would call him to come walk with her to kitchen/toilet/etc, understanding recommendations for Supervision. After discussion, supine > sit Supervision and walked to bathroom w/ RW CGA/close supervision, 3/3 toilet tasks in same manner and transfer. Walked toilet > sink for hygiene Supervision > bed CGA. Sit > supine Supervision and alarm on call bell in reach.   Therapy Documentation Precautions:  Precautions Precautions: Fall Precaution Comments: generalized weakness, peritoneal diaylisis, seizures, monitor O2 during ambulation Restrictions Weight Bearing Restrictions: No     Therapy/Group: Individual Therapy  Viona Gilmore 07/10/2020, 4:10 PM

## 2020-07-10 NOTE — Evaluation (Signed)
Recreational Therapy Assessment and Plan  Patient Details  Name: Amy [name] MRN: [id number] Date of Birth: 09/04/1950 Today's Date: 07/10/2020  Rehab Potential:  Good ELOS:   2 weeks Assessment  Hospital Problem: Principal Problem:   Debility   Past Medical History:      Past Medical History:  Diagnosis Date  . Anemia    a. 08/2016 Guaiac + stool. EGD w/ gastritis.  . Anxiety   . CKD (chronic kidney disease), stage III ([place])   . Closed fracture of shaft of left ulna 11/27/2018  . Colon polyp    a. 08/2016 Colonoscopy.  . Gastritis    a. 08/2016 EGD: Gastritis-->PPI.  [name] GERD (gastroesophageal reflux disease)   . History of chicken pox   . History of measles as a child   . Hypertension   . Hyponatremia    a. 08/2016 in setting of HCTZ Rx.  . Mesenteric lymphadenopathy 04/08/2017  . Multiple thyroid nodules    Past Surgical History:       Past Surgical History:  Procedure Laterality Date  . ABDOMINAL HYSTERECTOMY  2003   due to heavy periods and clotting during menses  . BREAST LUMPECTOMY Left 1990  . CHOLECYSTECTOMY  2010  . COLONOSCOPY WITH PROPOFOL N/A 09/07/2016   Procedure: COLONOSCOPY WITH PROPOFOL;  Surgeon: Lucilla Lame, MD;  [facility] ENDOSCOPY;  Service: Endoscopy;  Laterality: N/A;  . CORONARY STENT INTERVENTION N/A 06/27/2020   Procedure: CORONARY STENT INTERVENTION;  Surgeon: Sherren Mocha, MD;  Location: [place] CV LAB;  Service: Cardiovascular;  Laterality: N/A;  . DIALYSIS/PERMA CATHETER INSERTION N/A 11/16/2018   Procedure: DIALYSIS/PERMA CATHETER INSERTION;  Surgeon: Algernon Huxley, MD;  Location: [place] CV LAB;  Service: Cardiovascular;  Laterality: N/A;  . DIALYSIS/PERMA CATHETER REMOVAL N/A 08/16/2019   Procedure: DIALYSIS/PERMA CATHETER REMOVAL;  Surgeon: Algernon Huxley, MD;  Location: [place] CV LAB;  Service: Cardiovascular;  Laterality: N/A;  . DILATION AND CURETTAGE OF UTERUS  1985  .  ESOPHAGOGASTRODUODENOSCOPY (EGD) WITH PROPOFOL N/A 09/07/2016   Procedure: ESOPHAGOGASTRODUODENOSCOPY (EGD) WITH PROPOFOL;  Surgeon: Lucilla Lame, MD;  Location: [facility] ENDOSCOPY;  Service: Endoscopy;  Laterality: N/A;  . GALLBLADDER SURGERY  2012  . INTRAMEDULLARY (IM) NAIL INTERTROCHANTERIC Right 04/23/2020   Procedure: INTRAMEDULLARY (IM) NAIL INTERTROCHANTRIC;  Surgeon: Thornton Park, MD;  Location: [facility] ORS;  Service: Orthopedics;  Laterality: Right;  . INTRAVASCULAR IMAGING/OCT N/A 06/27/2020   Procedure: INTRAVASCULAR IMAGING/OCT;  Surgeon: Sherren Mocha, MD;  Location: [place] CV LAB;  Service: Cardiovascular;  Laterality: N/A;  . KNEE SURGERY     Dr. Tamala Julian  . LEFT HEART CATH AND CORONARY ANGIOGRAPHY N/A 06/25/2020   Procedure: LEFT HEART CATH AND CORONARY ANGIOGRAPHY;  Surgeon: Troy Sine, MD;  Location: [place] CV LAB;  Service: Cardiovascular;  Laterality: N/A;  . TONSILLECTOMY    . TONSILLECTOMY AND ADENOIDECTOMY  1970  . UPPER GI ENDOSCOPY  03/02/2006   H. Pylori negative; Dr. Allen Norris    Assessment & Plan Clinical Impression: Patient is a 70 y.o. year old female with history of ESRD- on PD, HTN, gastritis, A flutter, anxiety d/o, R-hip fracture s/p ORIF 04/2020, recent admission for diarrhea (found to be Covid + also) due to C diff colitis -> d/c to home on oral Vanc but unable to get meds who was readmitted on 06/19/20 with mental status changes due to brief seizure from multiple metabolic derangementsfromvolume depletion. EEG showed nonspecific cortical dysfunction in bifrontal region with moderate diffuse encephalopathy. She was  loaded with keppra and found to have elevated cardiac enzymes due to NSTEMI due to demand ischemia. Dr. Jory Sims recommends keppra 500 mg bid for new onset seizure with neuro follow up for adjustment or wean-->seizure provoked by multiple metabolic derangements and no driving X 6 months.   2D echo showed Takotsubo cardiomyopathy and  combined systolic/diastolic. CHF being managed with PD and sinus tach managed with addition of BB. CTA chest negative for PE but showed coronary calcification. She underwent cardiac cath revealing severe multivessel disease and underwent PCI 02/25 as not felt to be CABG candidate. She developed RUE hematoma post procedure which was treated with elevation/compression and acute on chronic anemia transfused with one unit PRBC. Dr. Felizardo Hoffmann team following for input on meds which have been optimized and limited by BP. She continues to be limited by fatigue and weakness with decreased WB LLE, delayed processing with memory deficits affecting activity. Due to PD unable to go to SNF but is showing improvement in activity tolerance and CIR recommended due to functional decline.  Patient transferred to CIR on 07/04/2020 .    Met with pt today to discuss TR services.  Pt with limited interest and hoping for her discharge date to be moved up.  Pt states she thinks she will do better at home.  Team made aware of her request.  Plan  No further TR at this time, will continue to monitor through team.  Recommendations for other services: None   Discharge Criteria: Patient will be discharged from TR if patient refuses treatment 3 consecutive times without medical reason.  If treatment goals not met, if there is a change in medical status, if patient makes no progress towards goals or if patient is discharged from hospital.  The above assessment, treatment plan, treatment alternatives and goals were discussed and mutually agreed upon: by patient  [place] 07/10/2020, 2:50 PM

## 2020-07-10 NOTE — Progress Notes (Signed)
Occupational Therapy Session Note  Patient Details  Name: Amy [name] MRN: [id number] Date of Birth: 18-Mar-1951  Today's Date: 07/10/2020 OT Individual Time: 1315-1400 OT Individual Time Calculation (min): 45 min    Short Term Goals: Week 1:  OT Short Term Goal 1 (Week 1): Pt will complete toilet transfer with CGA from w/c. OT Short Term Goal 2 (Week 1): Pt will complete 2/3 tasks of toileting with close S. OT Short Term Goal 3 (Week 1): Pt will complete standing ADL for >5 min with CGA. OT Short Term Goal 4 (Week 1): Pt will don pants with overall min A.  Skilled Therapeutic Interventions/Progress Updates:    1:1. Pt received in bed agreeable to OT after encouragement d/t pt having nausea. Pt ambaltes to bathroom with CGA with RW and VC for upright posture. Pt completes clothing amangement in standing CGA and hygiene in sitting with S. Standing hand hygiene at sink completed with CGA for standing tolerance. W/c propulsion and functional mobility around gift shop with RW completed for close quarters mobility/managing tight spaces to simulate home environment completed. Pt reporting liking to look at all the items was "fun." Pt returned to room exit alarm on and call light in reach  Therapy Documentation Precautions:  Precautions Precautions: Fall Precaution Comments: generalized weakness, peritoneal diaylisis, seizures, monitor O2 during ambulation Restrictions Weight Bearing Restrictions: No General:   Vital Signs: Therapy Vitals Temp: 98.4 F (36.9 C) Temp Source: Oral Pulse Rate: 88 Resp: 17 BP: 104/70 Patient Position (if appropriate): Lying Oxygen Therapy SpO2: 98 % O2 Device: Room Air Pain: Pain Assessment Pain Scale: 0-10 Pain Location: Head ADL: ADL Eating: Supervision/safety Where Assessed-Eating: Edge of bed Grooming: Supervision/safety Where Assessed-Grooming: Sitting at sink Upper Body Bathing: Supervision/safety Where Assessed-Upper Body Bathing: Edge  of bed,Sitting at sink Lower Body Bathing: Minimal assistance Where Assessed-Lower Body Bathing: Sitting at sink,Edge of bed Upper Body Dressing: Supervision/safety Where Assessed-Upper Body Dressing: Edge of bed,Sitting at sink Lower Body Dressing: Moderate assistance Where Assessed-Lower Body Dressing: Edge of bed,Sitting at sink Toileting: Moderate assistance Where Assessed-Toileting: Glass blower/designer: Psychiatric nurse Method: Arts development officer: Radiographer, therapeutic: Minimal Museum/gallery conservator Method: Stand pivot Tub/Shower Equipment: Shower seat with back,Grab bars,Walk in Librarian, academic    Praxis   Exercises:   Other Treatments:     Therapy/Group: Individual Therapy  Tonny Branch 07/10/2020, 6:54 AM

## 2020-07-10 NOTE — Progress Notes (Signed)
PROGRESS NOTE   Subjective/Complaints: Patient seen sitting up in bed this morning.  She states she slept fairly overnight.  She states that she has nausea and that she has never had a before.  She initially states that Zofran helps, but later states that it does not help.  She has questions regarding KUB.  Review of systems: + Nausea,?  Chronic, stable.  Denies CP, shortness of breath, diarrhea.  Objective:   DG Abd 1 View  Result Date: 07/09/2020 CLINICAL DATA:  Nausea.  Dialysis. EXAM: ABDOMEN - 1 VIEW COMPARISON:  CT 09/26/2016. FINDINGS: Peritoneal dialysis catheter noted. No bowel distention. Oral contrast noted throughout the colon. No free air. Undigested pill fragment noted in the stomach. Severe scoliosis and degenerative changes lumbar spine. Postsurgical changes right hip. Visceral vascular calcification. Bibasilar atelectasis and or scarring. Tiny bilateral pleural effusions cannot be excluded. IMPRESSION: 1. Peritoneal dialysis catheter noted. No bowel distention. Oral contrast noted throughout the colon. No acute abnormality identified. 2. Bibasilar atelectasis and or scarring. Tiny bilateral pleural effusions cannot be excluded. Electronically Signed   By: Marcello Moores  Register   On: 07/09/2020 10:49   No results for input(s): WBC, HGB, HCT, PLT in the last 72 hours. No results for input(s): NA, K, CL, CO2, GLUCOSE, BUN, CREATININE, CALCIUM in the last 72 hours.  Intake/Output Summary (Last 24 hours) at 07/10/2020 1046 Last data filed at 07/10/2020 0934 Gross per 24 hour  Intake 543 ml  Output --  Net 543 ml        Physical Exam: Vital Signs Blood pressure 110/70, pulse 86, temperature 98.4 F (36.9 C), temperature source Oral, resp. rate 17, height 5\' 4"  (1.626 m), weight 43.4 kg, SpO2 98 %. Constitutional: No distress . Vital signs reviewed. HENT: Normocephalic.  Atraumatic. Eyes: EOMI. No discharge. Cardiovascular:  No JVD.  RRR. Respiratory: Normal effort.  No stridor.  Bilateral clear to auscultation. GI: Non-distended.  BS +. Skin: Warm and dry.  Intact. Psych: Normal mood.  Normal behavior. Musc: No edema in extremities.  No tenderness in extremities. Neurologic: Alert Motor: 4/5 throughout, stable  Assessment/Plan: 1. Functional deficits which require 3+ hours per day of interdisciplinary therapy in a comprehensive inpatient rehab setting.  Physiatrist is providing close team supervision and 24 hour management of active medical problems listed below.  Physiatrist and rehab team continue to assess barriers to discharge/monitor patient progress toward functional and medical goals  Care Tool:  Bathing    Body parts bathed by patient: Right arm,Left arm,Chest,Abdomen,Front perineal area,Right upper leg,Left upper leg,Face,Right lower leg,Left lower leg   Body parts bathed by helper: Buttocks     Bathing assist Assist Level: Minimal Assistance - Patient > 75%     Upper Body Dressing/Undressing Upper body dressing   What is the patient wearing?: Pull over shirt    Upper body assist Assist Level: Moderate Assistance - Patient 50 - 74%    Lower Body Dressing/Undressing Lower body dressing      What is the patient wearing?: Pants     Lower body assist Assist for lower body dressing: Minimal Assistance - Patient > 75%     Chartered loss adjuster  assist Assist for toileting: Minimal Assistance - Patient > 75%     Transfers Chair/bed transfer  Transfers assist     Chair/bed transfer assist level: Minimal Assistance - Patient > 75%     Locomotion Ambulation   Ambulation assist      Assist level: Minimal Assistance - Patient > 75% Assistive device: Walker-rolling Max distance: 30'   Walk 10 feet activity   Assist  Walk 10 feet activity did not occur: Safety/medical concerns  Assist level: Minimal Assistance - Patient > 75% Assistive device:  Walker-rolling   Walk 50 feet activity   Assist Walk 50 feet with 2 turns activity did not occur: Safety/medical concerns  Assist level: Minimal Assistance - Patient > 75% Assistive device: Walker-rolling    Walk 150 feet activity   Assist Walk 150 feet activity did not occur: Safety/medical concerns         Walk 10 feet on uneven surface  activity   Assist Walk 10 feet on uneven surfaces activity did not occur: Safety/medical concerns         Wheelchair     Assist Will patient use wheelchair at discharge?: Yes Type of Wheelchair: Manual Wheelchair activity did not occur: Safety/medical concerns  Wheelchair assist level: Minimal Assistance - Patient > 75% Max wheelchair distance: 120'    Wheelchair 50 feet with 2 turns activity    Assist    Wheelchair 50 feet with 2 turns activity did not occur: Safety/medical concerns   Assist Level: Minimal Assistance - Patient > 75%   Wheelchair 150 feet activity     Assist  Wheelchair 150 feet activity did not occur: Safety/medical concerns       Medical Problem List and Plan: 1.Functional and mobility deficitssecondary to debility after multiple medical issues-CHF, hx of C diff colitis, recent R hip fx 12/21 with ORIF  Continue CIR  2. Antithrombotics: -DVT/anticoagulation:Mechanical:Antiembolism stockings, knee (TED hose) Bilateral lower extremities Sequential compression devices, below kneeBilateral lower extremities -antiplatelet therapy: ASA/plavix 3.Chronic daily frequent HA/Pain Management:tylenol for basic pain -fioricet for breakthrough headaches (on this PTA), DC'd on 3/10  Controlled with meds on 3/10 4. Mood:fairly positive. Team to provide ego support as needed -antipsychotic agents: n/a 5. Neuropsych: This patientiscapable of making decisions on herown behalf. 6. Skin/Wound Care:Routine pressure relief measures. 7.  Fluids/Electrolytes/Nutrition:Monitor I/Os.  8. ESRD: Continue daily PD, dialysis doing well at this point. 9. CAD s/p PCI: On ASA/Plavix, metoprolol and lipitor. Monitor for symptoms with increase in activity.  10. New onset seizures: On Keppra bid Off Wellbutrin. Monitor for anxiety symptoms.  No breakthrough seizures since admission to CIR-3/10 11. H/o A flutter: Monitor HR tid--continue metoprolol. 12. Anemia of chronic disease: Monitor with serial H/H--improved post transfusion.  --On Aranesp weekly  Hemoglobin 10.6 on 3/7  Hemoccult negative   Continue to monitor 13. C diff colitis: Completed treatment with Dificid on 02/27 -off contact precautions 14. Chronic combined CHF: Takatsubo CM with EF--25-30% -- Monitor weights daily.  --On demadex bid with Kdur for supplement as well as metoprolol and statin. Filed Weights   07/09/20 0700 07/09/20 1841 07/10/20 0629  Weight: 46 kg 44.2 kg 43.4 kg   Stable on 3/10 15.  Nausea, recent C. difficile colitis, end-stage renal disease  ?  Secondary to PD and electrolyte imbalance  KUB ordered, showing oral contrast throughout the colon, otherwise relatively unremarkable 16.  Sleep disturbance   Used Ambien at home.  Discontinue, start as needed trazodone 17.  Hypoalbuminemia  Supplement initiated 18.  Nausea  Suspect this is chronic given PD, confirmed by daughter.  However, patient states this is new.  KUB relatively unremarkable  Fioricet DC'd-monitor for improvement in nausea  Continue PPI  LOS: 6 days A FACE TO FACE EVALUATION WAS PERFORMED  Lleyton Byers Lorie Phenix 07/10/2020, 10:46 AM

## 2020-07-11 DIAGNOSIS — G894 Chronic pain syndrome: Secondary | ICD-10-CM | POA: Diagnosis not present

## 2020-07-11 DIAGNOSIS — I5042 Chronic combined systolic (congestive) and diastolic (congestive) heart failure: Secondary | ICD-10-CM | POA: Diagnosis not present

## 2020-07-11 DIAGNOSIS — R9431 Abnormal electrocardiogram [ECG] [EKG]: Secondary | ICD-10-CM

## 2020-07-11 DIAGNOSIS — D638 Anemia in other chronic diseases classified elsewhere: Secondary | ICD-10-CM | POA: Diagnosis not present

## 2020-07-11 DIAGNOSIS — R5381 Other malaise: Secondary | ICD-10-CM | POA: Diagnosis not present

## 2020-07-11 NOTE — Progress Notes (Signed)
PROGRESS NOTE   Subjective/Complaints: Patient seen sitting up in bed this AM.  She states she slept well overnight.  She notes significant improvement in nausea.  Yesterday called by nursing, stating patient was very upset Fioricet was [place], however upon discussion with patient this morning, she states she understands and is appreciative of attempts to improve nausea.  Review of systems: + Nausea improving,?  Chronic.  Denies CP, shortness of breath, diarrhea.  Objective:   DG Abd 1 View  Result Date: 07/09/2020 CLINICAL DATA:  Nausea.  Dialysis. EXAM: ABDOMEN - 1 VIEW COMPARISON:  CT 09/26/2016. FINDINGS: Peritoneal dialysis catheter noted. No bowel distention. Oral contrast noted throughout the colon. No free air. Undigested pill fragment noted in the stomach. Severe scoliosis and degenerative changes lumbar spine. Postsurgical changes right hip. Visceral vascular calcification. Bibasilar atelectasis and or scarring. Tiny bilateral pleural effusions cannot be excluded. IMPRESSION: 1. Peritoneal dialysis catheter noted. No bowel distention. Oral contrast noted throughout the colon. No acute abnormality identified. 2. Bibasilar atelectasis and or scarring. Tiny bilateral pleural effusions cannot be excluded. Electronically Signed   By: Marcello Moores  Register   On: 07/09/2020 10:49   No results for input(s): WBC, HGB, HCT, PLT in the last 72 hours. No results for input(s): NA, K, CL, CO2, GLUCOSE, BUN, CREATININE, CALCIUM in the last 72 hours.  Intake/Output Summary (Last 24 hours) at 07/11/2020 0906 Last data filed at 07/11/2020 0845 Gross per 24 hour  Intake 693 ml  Output -  Net 693 ml        Physical Exam: Vital Signs Blood pressure 115/65, pulse 86, temperature 98.2 F (36.8 C), resp. rate 17, height 5\' 4"  (1.626 m), weight 44.8 kg, SpO2 100 %. Constitutional: No distress . Vital signs reviewed. HENT: Normocephalic.   Atraumatic. Eyes: EOMI. No discharge. Cardiovascular: No JVD.  RRR. Respiratory: Normal effort.  No stridor.  Bilateral clear to auscultation. GI: Non-distended.  BS +. Skin: Warm and dry.  Intact. Psych: Normal mood.  Normal behavior. Musc: No edema in extremities.  No tenderness in extremities. Neurologic: Alert Motor: 4/5 throughout, improving  Assessment/Plan: 1. Functional deficits which require 3+ hours per day of interdisciplinary therapy in a comprehensive inpatient rehab setting.  Physiatrist is providing close team supervision and 24 hour management of active medical problems listed below.  Physiatrist and rehab team continue to assess barriers to discharge/monitor patient progress toward functional and medical goals  Care Tool:  Bathing    Body parts bathed by patient: Right arm,Left arm,Chest,Abdomen,Front perineal area,Right upper leg,Left upper leg,Face,Right lower leg,Left lower leg   Body parts bathed by helper: Buttocks     Bathing assist Assist Level: Minimal Assistance - Patient > 75%     Upper Body Dressing/Undressing Upper body dressing   What is the patient wearing?: Pull over shirt    Upper body assist Assist Level: Moderate Assistance - Patient 50 - 74%    Lower Body Dressing/Undressing Lower body dressing      What is the patient wearing?: Pants     Lower body assist Assist for lower body dressing: Minimal Assistance - Patient > 75%     Chartered loss adjuster  assist Assist for toileting: Contact Guard/Touching assist     Transfers Chair/bed transfer  Transfers assist     Chair/bed transfer assist level: Minimal Assistance - Patient > 75%     Locomotion Ambulation   Ambulation assist      Assist level: Minimal Assistance - Patient > 75% Assistive device: Walker-rolling Max distance: 59   Walk 10 feet activity   Assist  Walk 10 feet activity did not occur: Safety/medical concerns  Assist level: Minimal  Assistance - Patient > 75% Assistive device: Walker-rolling   Walk 50 feet activity   Assist Walk 50 feet with 2 turns activity did not occur: Safety/medical concerns  Assist level: Minimal Assistance - Patient > 75% Assistive device: Walker-rolling    Walk 150 feet activity   Assist Walk 150 feet activity did not occur: Safety/medical concerns         Walk 10 feet on uneven surface  activity   Assist Walk 10 feet on uneven surfaces activity did not occur: Safety/medical concerns         Wheelchair     Assist Will patient use wheelchair at discharge?: Yes Type of Wheelchair: Manual Wheelchair activity did not occur: Safety/medical concerns  Wheelchair assist level: Minimal Assistance - Patient > 75% Max wheelchair distance: 120'    Wheelchair 50 feet with 2 turns activity    Assist    Wheelchair 50 feet with 2 turns activity did not occur: Safety/medical concerns   Assist Level: Minimal Assistance - Patient > 75%   Wheelchair 150 feet activity     Assist  Wheelchair 150 feet activity did not occur: Safety/medical concerns       Medical Problem List and Plan: 1.Functional and mobility deficitssecondary to debility after multiple medical issues-CHF, hx of C diff colitis, recent R hip fx 12/21 with ORIF  Continue CIR  2. Antithrombotics: -DVT/anticoagulation:Mechanical:Antiembolism stockings, knee (TED hose) Bilateral lower extremities Sequential compression devices, below kneeBilateral lower extremities -antiplatelet therapy: ASA/plavix 3.Chronic daily frequent HA/Pain Management:tylenol for basic pain -fioricet for breakthrough headaches (on this PTA), DC'd on 3/10  Controlled with meds on 3/11 4. Mood:fairly positive. Team to provide ego support as needed -antipsychotic agents: n/a 5. Neuropsych: This patientiscapable of making decisions on herown behalf. 6. Skin/Wound Care:Routine  pressure relief measures. 7. Fluids/Electrolytes/Nutrition:Monitor I/Os.  8. ESRD: Continue daily PD, dialysis doing well at this point. 9. CAD s/p PCI: On ASA/Plavix, metoprolol and lipitor. Monitor for symptoms with increase in activity.  10. New onset seizures: On Keppra bid Off Wellbutrin. Monitor for anxiety symptoms.  No breakthrough seizures since admission to CIR- 3/11 11. H/o A flutter: Monitor HR tid--continue metoprolol. 12. Anemia of chronic disease: Monitor with serial H/H--improved post transfusion.  --On Aranesp weekly  Hemoglobin 10.6 on 3/7, labs ordered for Monday  Hemoccult negative   Continue to monitor 13. C diff colitis: Completed treatment with Dificid on 02/27 -off contact precautions 14. Chronic combined CHF: Tak[place]ubo CM with EF--25-30% -- Monitor weights daily.  --On demadex bid with Kdur for supplement as well as metoprolol and statin. Filed Weights   07/09/20 1841 07/10/20 0629 07/10/20 1801  Weight: 44.2 kg 43.4 kg 44.8 kg   Stable on 3/10, no labs for today 15.  Nausea, recent C. difficile colitis, end-stage renal disease  Suspect this is chronic given PD, electrolyte abnormalities, confirmed by daughter.  However, patient states this is new.  KUB ordered, showing oral contrast throughout the colon, otherwise relatively unremarkable  Improving, plan to repeat KUB in  1-2 days.  Continue Reglan 5 BID, repeat ECG ordered  Continue PPI-history of ulcer 16.  Sleep disturbance   Used Ambien at home.  Discontinue, start as needed trazodone 17.  Hypoalbuminemia  Supplement initiated 18.  History of prolonged QTC  See #15  Zofran DC'd--patient states ineffective anyway.  LOS: 7 days A FACE TO FACE EVALUATION WAS PERFORMED  Ankit Lorie Phenix 07/11/2020, 9:06 AM

## 2020-07-11 NOTE — Progress Notes (Signed)
[facility] Progress Note   Subjective:  Seen in room. No complaints.  PD overnight -no issues   Objective Vitals:   07/10/20 1531 07/10/20 1801 07/10/20 1927 07/11/20 0514  BP: 115/68 109/63 104/69 115/65  Pulse: 90 88 83 86  Resp: 16 16 17 17   Temp: 98.4 F (36.9 C) 98.8 F (37.1 C) 98.3 F (36.8 C) 98.2 F (36.8 C)  TempSrc:  Oral    SpO2: 99% 100% 100% 100%  Weight:  44.8 kg    Height:         Additional Objective Labs: Basic Metabolic Panel: Recent Labs  Lab 07/07/20 0448  NA 128*  K 3.0*  CL 92*  CO2 23  GLUCOSE 101*  BUN 19  CREATININE 4.76*  CALCIUM 8.6*  PHOS 3.7   CBC: Recent Labs  Lab 07/07/20 0448  WBC 6.3  HGB 10.6*  HCT 30.7*  MCV 92.7  PLT 294   Blood Culture    Component Value Date/Time   SDES BLOOD BLOOD RIGHT HAND 05/29/2020 2043   SPECREQUEST  05/29/2020 2043    BOTTLES DRAWN AEROBIC AND ANAEROBIC Blood Culture adequate volume   CULT  05/29/2020 2043    NO GROWTH 5 DAYS Performed at [facility], [address] [place], [place] [address]    REPTSTATUS 06/03/2020 FINAL 05/29/2020 2043     Physical Exam General: Well appearing,nad Heart: RRR no m,r,g Lungs: Clear bilaterally  Abdomen: soft ntnd Extremities: No LE edema  Dialysis Access: PD cath in place dsg c,d,i  Medications: . dialysis solution 1.5% low-MG/low-CA     . aspirin  81 mg Oral Daily  . atorvastatin  80 mg Oral q1800  . Chlorhexidine Gluconate Cloth  6 each Topical Daily  . cinacalcet  30 mg Oral Q breakfast  . clopidogrel  75 mg Oral Daily  . [START ON 07/19/2020] darbepoetin (ARANESP) injection - NON-DIALYSIS  100 mcg Subcutaneous Q Sat-1800  . feeding supplement  1 Container Oral TID BM  . gentamicin cream  1 application Topical Daily  . levETIRAcetam  500 mg Oral BID  . metoCLOPramide  5 mg Oral BID  . metoprolol succinate  25 mg Oral Daily  . multivitamin  1 tablet Oral QHS  . pantoprazole  40 mg Oral Daily  .  potassium chloride  10 mEq Oral Daily  . sodium chloride flush  3 mL Intravenous Q12H  . torsemide  100 mg Oral BID    OP PD: [place] followed by Dr. Candiss Norse. 4 exchanges overnight, 2000 cc and 1.5 hrs each.     Assessment/ Plan: 1. Multivessel CAD with reduced EF 25/30%: HF team following.Eval by TCTS, is"poor CABG candidate". SP PCI to LAD with DESon 2/25.getting Plavix and ASA 81 mg,+ statin 2. Debility: 2/2 Multiple med issues mainly CAD > now on CIR 3. ESRD -stable on PD, torsemide 100 bid and po KCL 10 bid. Update renal labs.   4. HTN/volume -BPin goal,on metoprolol 25 mg daily. When using 2.5% pt c/o feeling "dizzy" > so now doing all 1.5% bags. No vol excess by exam.  5. Anemia -Hgb 9- 11 range. s/p 1unitPRBCon 2/20 and on 2/27. Continue Aranesp 100 MCG q Saturday. tsat 91%,ferritin 1131. No need for iron at this time. 6. Secondary hyperparathyroidism: Phos at goal, no binders.  On Sensipar 30/d,stoppedRocaltrol with elevatedcorrectedcalcium>10, last phos check was 3 7. Hyponatremia -Chronic   8. Nutrition: Albumin low 1.7. On regular diet with prot supp.  9.  Seizures - do to electrolyte abnormality in setting of C diff. Now on Keppra per neuro. No breakthrough seizures since admission to CIR   10. C diff - finished ABX course on 2/27. 11. Disposition-CIR admit for rehab   Lynnda Child PA-C [facility] 07/11/2020,9:24 AM

## 2020-07-11 NOTE — Progress Notes (Addendum)
Physical Therapy Weekly Progress Note  Patient Details  Name: MADELLINE ESHBACH MRN: [id number] Date of Birth: 1950/11/12  Beginning of progress report period: July 05, 2020 End of progress report period: July 11, 2020  Today's Date: 07/11/2020 PT Individual Time: 1300-1405 PT Individual Time Calculation (min): 65 min   Patient has met 4 of 4 short term goals.  Patient met all short term goals.  Still fatigues quickly with upright activity and demonstrates weakness with ambulation, transfers and balance activities.    Patient continues to demonstrate the following deficits muscle weakness, decreased balance, decreased activity tolerance and poor safety awareness and therefore will continue to benefit from skilled PT intervention to increase functional independence with mobility.  Patient progressing toward long term goals..  Continue plan of care.  PT Short Term Goals Week 1:  PT Short Term Goal 1 (Week 1): Pt will perform bed mobility with supervision. PT Short Term Goal 1 - Progress (Week 1): Met PT Short Term Goal 2 (Week 1): Pt will perform STS and SPVT transfers with CGA. PT Short Term Goal 2 - Progress (Week 1): Met PT Short Term Goal 3 (Week 1): Pt will ambulate at least 35 feet using RW with CGA PT Short Term Goal 3 - Progress (Week 1): Met PT Short Term Goal 4 (Week 1): Pt will perform 1 step using RW with Min A. PT Short Term Goal 4 - Progress (Week 1): Met Week 2:  PT Short Term Goal 1 (Week 2): Patient will ambulate 120' consistently with CGA with RW. PT Short Term Goal 2 (Week 2): Patient will demonstrate standing dynamic balance with CGA using 1 hand support for functional activities. PT Short Term Goal 3 (Week 2): Patient will negotiate 4 steps with rail and CGA. PT Short Term Goal 4 (Week 2): Patient to tolerate standing activity for 10 minutes without seated rest.  Skilled Therapeutic Interventions/Progress Updates:  Patient in supine and reports has not had lunch yet,  but can participate in therapy as able.  Performed supine to sit with S.  Patient transferred to w/c CGA.  Pushed in chair to therapy gym (dayroom)  Performed sit to stand with CGA and ambulated x 80' with RW and CGA.  Standing at rail for side stepping with CGA and UE support, then forward march with UE support on rail and HHA, standing hamstring curls, then stepping over cones side stepping at rail all with seated rest in between.  Patient assisted in w/c to therapy gym and performed step ups to first step (6") with rails and min A x 2 each leg (noted crepitus audible on R). Seated for hip flexion and LAQ with 2# weights 2 x 10 reps each.  Patient assisted in w/c to room and set up with lunch tray and daughter in room to assist as needed.    Patient missed 10 minutes of skilled PT due to lunch and fatigue.   Ambulation/gait training;Balance/vestibular training;Community reintegration;Discharge planning;Disease management/prevention;DME/adaptive equipment instruction;Functional mobility training;Neuromuscular re-education;Pain management;Patient/family education;Psychosocial support;Skin care/wound management;Stair training;Therapeutic Activities;Therapeutic Exercise;UE/LE Strength taining/ROM;UE/LE Coordination activities;Visual/perceptual remediation/compensation   Therapy Documentation Precautions:  Precautions Precautions: Fall Precaution Comments: generalized weakness, peritoneal diaylisis, seizures, monitor O2 during ambulation Restrictions Weight Bearing Restrictions: No Pain: Pain Assessment Faces Pain Scale: Hurts little more Pain Type: Chronic pain Pain Location: Head Pain Descriptors / Indicators: Headache Pain Onset: On-going Pain Intervention(s): Distraction   Therapy/Group: Individual Therapy  Reginia Naas  [place], [place] 07/11/2020, 2:12 PM

## 2020-07-11 NOTE — Progress Notes (Signed)
Occupational Therapy Session Note  Patient Details  Name: Amy [name] MRN: [id number] Date of Birth: [birth year]  Today's Date: 07/11/2020 OT Individual Time: 1517-6160 and 7371-0626 OT Individual Time Calculation (min): 83 min and 22 min   Short Term Goals: Week 1:  OT Short Term Goal 1 (Week 1): Pt will complete toilet transfer with CGA from w/c. OT Short Term Goal 2 (Week 1): Pt will complete 2/3 tasks of toileting with close S. OT Short Term Goal 3 (Week 1): Pt will complete standing ADL for >5 min with CGA. OT Short Term Goal 4 (Week 1): Pt will don pants with overall min A.  Skilled Therapeutic Interventions/Progress Updates:    Pt greeted in bed and agreeable to engage in bathing/dressing tasks during session. She completed stated tasks sit<stand from EOB, using RW for standing support as needed. Pt required setup for UB self care and CGA for LB self care. Note that while pt was completing perihygiene in the back she reported needing to void bowels. CGA for stand pivot<BSC using RW. Pt able to void B+B. CGA for hygiene before dressing with CGA and rest breaks provided. She ambulated using RW with the same assistance to the w/c parked in front of the sink. OT washed her hair using the hair washing tray and then pt used multiple hair products to style hair given Min A. Worked on UB strengthening/endurance and OOB tolerance at this time, pt also using the hair-dryer. Setup for oral care completion. She donned 4 pillowcases onto pillows for her bed and then completed stand pivot<bed with CGA. She remained in bed at close of session, all needs within reach and bed alarm set.  2nd Session 1:1 tx (22 min) Pt greeted while sitting on the toilet, NT present to supervise. Pt just had continent BM. Min A for hygiene thoroughness in the back before pt elevated clothing with CGA (Mod A for sit<stand from low toilet with vcs for hand placement). CGA for ambulatory transfer to the sink to wash her hands  while using the RW before transferring to bed. Once pt was comfortably reclined in bed, OT printed her an energy conservation handout. We reviewed energy conservation principles together to utilize during functional activity at home. Pt receptive to education, collaborative, and highlighting tips that she felt were most helpful to remember at home. She remained in bed at close of session, all needs within reach and bed alarm set.   Therapy Documentation Precautions:  Precautions Precautions: Fall Precaution Comments: generalized weakness, peritoneal diaylisis, seizures, monitor O2 during ambulation Restrictions Weight Bearing Restrictions: No Pain: pt reports minimal HA discomfort at times, opted to wait to receive her pain medicine from RN at end of tx (1st session); during 2nd tx, RN was in to provide pain medicine for her HA   ADL: ADL Eating: Supervision/safety Where Assessed-Eating: Edge of bed Grooming: Supervision/safety Where Assessed-Grooming: Sitting at sink Upper Body Bathing: Supervision/safety Where Assessed-Upper Body Bathing: Edge of bed,Sitting at sink Lower Body Bathing: Minimal assistance Where Assessed-Lower Body Bathing: Sitting at sink,Edge of bed Upper Body Dressing: Supervision/safety Where Assessed-Upper Body Dressing: Edge of bed,Sitting at sink Lower Body Dressing: Moderate assistance Where Assessed-Lower Body Dressing: Edge of bed,Sitting at sink Toileting: Moderate assistance Where Assessed-Toileting: Glass blower/designer: Psychiatric nurse Method: Arts development officer: Radiographer, therapeutic: Minimal Museum/gallery conservator Method: Stand pivot Tub/Shower Equipment: Shower seat with back,Grab bars,Walk in shower      Therapy/Group: Individual Therapy  Marien Manship  A Heiley Shaikh 07/11/2020, 12:49 PM

## 2020-07-12 DIAGNOSIS — E876 Hypokalemia: Secondary | ICD-10-CM

## 2020-07-12 DIAGNOSIS — I5042 Chronic combined systolic (congestive) and diastolic (congestive) heart failure: Secondary | ICD-10-CM

## 2020-07-12 DIAGNOSIS — R569 Unspecified convulsions: Secondary | ICD-10-CM | POA: Diagnosis not present

## 2020-07-12 DIAGNOSIS — I5022 Chronic systolic (congestive) heart failure: Secondary | ICD-10-CM

## 2020-07-12 DIAGNOSIS — R9431 Abnormal electrocardiogram [ECG] [EKG]: Secondary | ICD-10-CM | POA: Diagnosis not present

## 2020-07-12 DIAGNOSIS — R5381 Other malaise: Secondary | ICD-10-CM | POA: Diagnosis not present

## 2020-07-12 DIAGNOSIS — G894 Chronic pain syndrome: Secondary | ICD-10-CM | POA: Diagnosis not present

## 2020-07-12 LAB — RENAL FUNCTION PANEL
Albumin: 1.6 g/dL — ABNORMAL LOW (ref 3.5–5.0)
Anion gap: 12 (ref 5–15)
BUN: 15 mg/dL (ref 8–23)
CO2: 24 mmol/L (ref 22–32)
Calcium: 7.7 mg/dL — ABNORMAL LOW (ref 8.9–10.3)
Chloride: 91 mmol/L — ABNORMAL LOW (ref 98–111)
Creatinine, Ser: 4.41 mg/dL — ABNORMAL HIGH (ref 0.44–1.00)
GFR, Estimated: 10 mL/min — ABNORMAL LOW (ref >60)
Glucose, Bld: 89 mg/dL (ref 70–99)
Phosphorus: 3.3 mg/dL (ref 2.5–4.6)
Potassium: 3.1 mmol/L — ABNORMAL LOW (ref 3.5–5.1)
Sodium: 127 mmol/L — ABNORMAL LOW (ref 135–145)

## 2020-07-12 MED ORDER — METOCLOPRAMIDE HCL 5 MG PO TABS
5.0000 mg | ORAL_TABLET | Freq: Every day | ORAL | Status: DC
  Administered 2020-07-13 – 2020-07-15 (×3): 5 mg via ORAL
  Filled 2020-07-12 (×3): qty 1

## 2020-07-12 MED ORDER — POTASSIUM CHLORIDE CRYS ER 10 MEQ PO TBCR
10.0000 meq | EXTENDED_RELEASE_TABLET | Freq: Two times a day (BID) | ORAL | Status: DC
  Administered 2020-07-12 – 2020-07-13 (×3): 10 meq via ORAL
  Filled 2020-07-12 (×3): qty 1

## 2020-07-12 NOTE — Progress Notes (Signed)
Occupational Therapy Session Note  Patient Details  Name: Amy [name] MRN: [id number] Date of Birth: 1950/12/22  Today's Date: 07/12/2020 OT Individual Time: 1102-1200 OT Individual Time Calculation (min): 58 min   Short Term Goals: Week 1:  OT Short Term Goal 1 (Week 1): Pt will complete toilet transfer with CGA from w/c. OT Short Term Goal 2 (Week 1): Pt will complete 2/3 tasks of toileting with close S. OT Short Term Goal 3 (Week 1): Pt will complete standing ADL for >5 min with CGA. OT Short Term Goal 4 (Week 1): Pt will don pants with overall min A.     Skilled Therapeutic Interventions/Progress Updates:    Pt greeted via OT handoff. Pt was blow-drying her hair while sitting at the sink, affect bright. She needed Min A to use her hairspray due to digit weakness. Pt doffed her shirt and donned a clean one with setup assist. CGA when donning clean mesh underwear after sit<stand at the sink. She then ambulated a short distance to the bed using RW with CGA. While sitting EOB, pt worked on Education officer, museum by engaging in clothespin activity, pt placing pins on horizontal and vertical pipes and then removing them. Noted Lt>Rt UE weakness, pt needing 2 hands to meet task demands with clothespins that were green, blue, and black when using the Lt hand at dominant level. With the Rt hand at dominant level, pt only needing to use both hands to meet task demands with blue and black clothespins. Pt returned to bed after and then boosted herself up in bed with bed placed in trendelenburg position. With [facility] raised, pt engaged in theraputty activity to continue working on hand/digit strengthening, pt instructed to make several shapes and to continue with theraputty use independently. We discussed therapeutic benefits of engaging in hand/digit strengthening exercises in order to increase her functional independence with hair care. Pt remained in bed at close of session, all needs within reach and bed  alarm set.   Therapy Documentation Precautions:  Precautions Precautions: Fall Precaution Comments: generalized weakness, peritoneal diaylisis, seizures, monitor O2 during ambulation Restrictions Weight Bearing Restrictions: No Pain: Occasional HA pain, pt reporting HA to be manageable without additional interventions from RN. OT provided her with aromatherapy to address with pt consent   ADL: ADL Eating: Supervision/safety Where Assessed-Eating: Edge of bed Grooming: Supervision/safety Where Assessed-Grooming: Sitting at sink Upper Body Bathing: Supervision/safety Where Assessed-Upper Body Bathing: Edge of bed,Sitting at sink Lower Body Bathing: Minimal assistance Where Assessed-Lower Body Bathing: Sitting at sink,Edge of bed Upper Body Dressing: Supervision/safety Where Assessed-Upper Body Dressing: Edge of bed,Sitting at sink Lower Body Dressing: Moderate assistance Where Assessed-Lower Body Dressing: Edge of bed,Sitting at sink Toileting: Moderate assistance Where Assessed-Toileting: Glass blower/designer: Psychiatric nurse Method: Arts development officer: Radiographer, therapeutic: Minimal Museum/gallery conservator Method: Stand pivot Tub/Shower Equipment: Shower seat with back,Grab bars,Walk in shower     Therapy/Group: Individual Therapy  Khaliel Morey A Justinn Welter 07/12/2020, 12:23 PM

## 2020-07-12 NOTE — Progress Notes (Signed)
[facility] Progress Note   Subjective:  Seen in room. No complaints.  Had PD overnight -no issues.   Objective Vitals:   07/11/20 1732 07/12/20 0500 07/12/20 0542 07/12/20 0711  BP: (!) 102/57  103/67 118/68  Pulse: 91  91 86  Resp:   20 16  Temp:   98.7 F (37.1 C) 98.5 F (36.9 C)  TempSrc:    Oral  SpO2:   99% 99%  Weight:  44.3 kg    Height:         Additional Objective Labs: Basic Metabolic Panel: Recent Labs  Lab 07/07/20 0448 07/12/20 0503  NA 128* 127*  K 3.0* 3.1*  CL 92* 91*  CO2 23 24  GLUCOSE 101* 89  BUN 19 15  CREATININE 4.76* 4.41*  CALCIUM 8.6* 7.7*  PHOS 3.7 3.3   CBC: Recent Labs  Lab 07/07/20 0448  WBC 6.3  HGB 10.6*  HCT 30.7*  MCV 92.7  PLT 294   Blood Culture    Component Value Date/Time   SDES BLOOD BLOOD RIGHT HAND 05/29/2020 2043   SPECREQUEST  05/29/2020 2043    BOTTLES DRAWN AEROBIC AND ANAEROBIC Blood Culture adequate volume   CULT  05/29/2020 2043    NO GROWTH 5 DAYS Performed at [facility], [address] [place], [place] [address]    REPTSTATUS 06/03/2020 FINAL 05/29/2020 2043     Physical Exam General: Well appearing,nad Heart: RRR no m,r,g Lungs: Clear bilaterally  Abdomen: soft ntnd Extremities: No LE edema  Dialysis Access: PD cath in place dsg c,d,i  Medications: . dialysis solution 1.5% low-MG/low-CA     . aspirin  81 mg Oral Daily  . atorvastatin  80 mg Oral q1800  . Chlorhexidine Gluconate Cloth  6 each Topical Daily  . cinacalcet  30 mg Oral Q breakfast  . clopidogrel  75 mg Oral Daily  . [START ON 07/19/2020] darbepoetin (ARANESP) injection - NON-DIALYSIS  100 mcg Subcutaneous Q Sat-1800  . feeding supplement  1 Container Oral TID BM  . gentamicin cream  1 application Topical Daily  . levETIRAcetam  500 mg Oral BID  . metoCLOPramide  5 mg Oral BID  . metoprolol succinate  25 mg Oral Daily  . multivitamin  1 tablet Oral QHS  . pantoprazole  40 mg Oral Daily  .  potassium chloride  10 mEq Oral Daily  . sodium chloride flush  3 mL Intravenous Q12H  . torsemide  100 mg Oral BID    OP PD: [place] followed by Dr. Candiss Norse. 4 exchanges overnight, 2000 cc and 1.5 hrs each.     Assessment/ Plan: 1. Multivessel CAD with reduced EF 25/30%: Eval by TCTS, is"poor CABG candidate". s/p PCI to LAD with DESon 2/25.getting Plavix and ASA 81 mg,+ statin 2. Debility: 2/2 Multiple med issues mainly CAD > now on CIR 3. ESRD - Continue [name]. Stable. Torsemide 100 bid and KCl 10 bid.  4. HTN/volume -BPin goal,on metoprolol 25 mg daily. When using 2.5% pt c/o feeling "dizzy" > so now doing all 1.5% bags. No vol excess by exam.  5. Anemia -Hgb 9- 11 range. s/p 1unitPRBCon 2/20 and on 2/27. Continue Aranesp 100 MCG q Saturday. tsat 91%,ferritin 1131. No need for iron at this time. 6. Secondary hyperparathyroidism: Phos at goal, no binders.  On Sensipar 30/d,stoppedRocaltrol with elevatedcorrectedcalcium>10, last phos check was 3 7. Hyponatremia -Chronic   8. Nutrition: Albumin low 1.7. On regular diet with prot supp.  9. Seizures - do to electrolyte abnormality in setting of C diff. Now on Keppra per neuro. No breakthrough seizures since admission to CIR   10. C diff - finished ABX course on 2/27. 11. Disposition-CIR admit for rehab   Lynnda Child PA-C [facility] 07/12/2020,10:58 AM

## 2020-07-12 NOTE — Progress Notes (Signed)
Occupational Therapy Session Note  Patient Details  Name: Amy [name] MRN: [id number] Date of Birth: 14-Apr-1951  Today's Date: 07/12/2020 OT Individual Time: 1015-1100 OT Individual Time Calculation (min): 45 min   Session 2:  OT Individual Time: 1435-1500 OT Individual Time Calculation (min): 25 min    Short Term Goals: Week 1:  OT Short Term Goal 1 (Week 1): Pt will complete toilet transfer with CGA from w/c. OT Short Term Goal 2 (Week 1): Pt will complete 2/3 tasks of toileting with close S. OT Short Term Goal 3 (Week 1): Pt will complete standing ADL for >5 min with CGA. OT Short Term Goal 4 (Week 1): Pt will don pants with overall min A.  Skilled Therapeutic Interventions/Progress Updates:    Pt received supine with no c/o pain. Pt agreeable to OT session, requesting to wash hair. BP assessed supine- 111/70. Pt completed ambulatory transfer into the bathroom with CGA with use of the RW. Toileting tasks with close supervision- small BM void. Pt returned to the w/c with CGA, SpO2 100% and HR 111 bpm. Oral care completed with set up assist. Pt assisted with hair washing using tray in the sink. She was able to brush through her hair and finish hair drying with improvements in overall endurance noted. Pt was passed off to OT in room.   Session 2: Pt received supine requesting to use the bathroom. She completed bed mobility with supervision. CGA to stand with min cueing for hand placement on RW. Ambulatory transfer into the bathroom with close supervision- CGA. She completed all toileting tasks with supervision. Pt returned to EOB with CGA after completing hand hygiene. Pt completed BUE strengthening circuit with a 2lb dowel, overhead and forward to challenge strength and endurance needed for ADL/IADL participation. Pt returned to supine and was left with all needs met, bed alarm set.   Therapy Documentation Precautions:  Precautions Precautions: Fall Precaution Comments: generalized  weakness, peritoneal diaylisis, seizures, monitor O2 during ambulation Restrictions Weight Bearing Restrictions: No   Therapy/Group: Individual Therapy  Curtis Sites 07/12/2020, 6:45 AM

## 2020-07-12 NOTE — Progress Notes (Signed)
PROGRESS NOTE   Subjective/Complaints: Patient seen sitting up in bed this morning.  She states she did not sleep well overnight, but for no particular reason.  She notes resolution of nausea.  She is seen by nephrology yesterday, notes reviewed-no changes.  Review of systems: Denies CP, shortness of breath, nausea, vomiting, diarrhea.  Objective:   No results found. No results for input(s): WBC, HGB, HCT, PLT in the last 72 hours. Recent Labs    07/12/20 0503  NA 127*  K 3.1*  CL 91*  CO2 24  GLUCOSE 89  BUN 15  CREATININE 4.41*  CALCIUM 7.7*    Intake/Output Summary (Last 24 hours) at 07/12/2020 1115 Last data filed at 07/12/2020 0857 Gross per 24 hour  Intake 240 ml  Output -  Net 240 ml        Physical Exam: Vital Signs Blood pressure 118/68, pulse 86, temperature 98.5 F (36.9 C), temperature source Oral, resp. rate 16, height 5\' 4"  (1.626 m), weight 44.3 kg, SpO2 99 %. Constitutional: No distress . Vital signs reviewed. HENT: Normocephalic.  Atraumatic. Eyes: EOMI. No discharge. Cardiovascular: No JVD.  RRR. Respiratory: Normal effort.  No stridor.  Bilateral clear to auscultation. GI: Non-distended.  BS +. Skin: Warm and dry.  Intact. Psych: Normal mood.  Normal behavior. Musc: No edema in extremities.  No tenderness in extremities. Neurologic: Alert Motor: 4/5 throughout, stable  Assessment/Plan: 1. Functional deficits which require 3+ hours per day of interdisciplinary therapy in a comprehensive inpatient rehab setting.  Physiatrist is providing close team supervision and 24 hour management of active medical problems listed below.  Physiatrist and rehab team continue to assess barriers to discharge/monitor patient progress toward functional and medical goals  Care Tool:  Bathing    Body parts bathed by patient: Right arm,Left arm,Chest,Abdomen,Front perineal area,Right upper leg,Left upper  leg,Face,Right lower leg,Left lower leg,Buttocks   Body parts bathed by helper: Buttocks     Bathing assist Assist Level: Contact Guard/Touching assist     Upper Body Dressing/Undressing Upper body dressing   What is the patient wearing?: Pull over shirt    Upper body assist Assist Level: Set up assist    Lower Body Dressing/Undressing Lower body dressing      What is the patient wearing?: Pants,Underwear/pull up     Lower body assist Assist for lower body dressing: Contact Guard/Touching assist     Toileting Toileting    Toileting assist Assist for toileting: Contact Guard/Touching assist     Transfers Chair/bed transfer  Transfers assist     Chair/bed transfer assist level: Contact Guard/Touching assist     Locomotion Ambulation   Ambulation assist      Assist level: Contact Guard/Touching assist Assistive device: Walker-rolling Max distance: 60   Walk 10 feet activity   Assist  Walk 10 feet activity did not occur: Safety/medical concerns  Assist level: Contact Guard/Touching assist Assistive device: Walker-rolling   Walk 50 feet activity   Assist Walk 50 feet with 2 turns activity did not occur: Safety/medical concerns  Assist level: Contact Guard/Touching assist Assistive device: Walker-rolling    Walk 150 feet activity   Assist Walk 150 feet activity did  not occur: Safety/medical concerns         Walk 10 feet on uneven surface  activity   Assist Walk 10 feet on uneven surfaces activity did not occur: Safety/medical concerns         Wheelchair     Assist Will patient use wheelchair at discharge?: Yes Type of Wheelchair: Manual Wheelchair activity did not occur: Safety/medical concerns  Wheelchair assist level: Minimal Assistance - Patient > 75% Max wheelchair distance: 120'    Wheelchair 50 feet with 2 turns activity    Assist    Wheelchair 50 feet with 2 turns activity did not occur: Safety/medical  concerns   Assist Level: Minimal Assistance - Patient > 75%   Wheelchair 150 feet activity     Assist  Wheelchair 150 feet activity did not occur: Safety/medical concerns       Medical Problem List and Plan: 1.Functional and mobility deficitssecondary to debility after multiple medical issues-CHF, hx of C diff colitis, recent R hip fx 12/21 with ORIF  Continue CIR  2. Antithrombotics: -DVT/anticoagulation:Mechanical:Antiembolism stockings, knee (TED hose) Bilateral lower extremities Sequential compression devices, below kneeBilateral lower extremities -antiplatelet therapy: ASA/plavix 3.Chronic daily frequent HA/Pain Management:tylenol for basic pain -fioricet for breakthrough headaches (on this PTA)  Controlled with meds on 3/12 4. Mood:fairly positive. Team to provide ego support as needed -antipsychotic agents: n/a 5. Neuropsych: This patientiscapable of making decisions on herown behalf. 6. Skin/Wound Care:Routine pressure relief measures. 7. Fluids/Electrolytes/Nutrition:Monitor I/Os.  8. ESRD: Continue daily PD, dialysis doing well at this point. 9. CAD s/p PCI: On ASA/Plavix, metoprolol and lipitor. Monitor for symptoms with increase in activity.  10. New onset seizures: On Keppra bid Off Wellbutrin. Monitor for anxiety symptoms.  No breakthrough seizures since admission to CIR-3/12 11. H/o A flutter: Monitor HR tid--continue metoprolol. 12. Anemia of chronic disease: Monitor with serial H/H--improved post transfusion.  --On Aranesp weekly  Hemoglobin 10.6 on 3/7, labs ordered for Monday  Hemoccult negative   Continue to monitor 13. C diff colitis: Completed treatment with Dificid on 02/27 -off contact precautions 14. Chronic combined CHF: Takatsubo CM with EF--25-30% -- Monitor weights daily.  --On demadex bid with Kdur for supplement as  well as metoprolol and statin. Filed Weights   07/11/20 0610 07/11/20 1715 07/12/20 0500  Weight: 44.4 kg 44.8 kg 44.3 kg   Stable on 3/12 15.  Nausea, recent C. difficile colitis, end-stage renal disease  Suspect this is chronic given PD, electrolyte abnormalities, confirmed by daughter.  However, patient states this is new.  KUB ordered, showing oral contrast throughout the colon, otherwise relatively unremarkable  Improving, plan to repeat KUB in 1-2 days.  Continue Reglan 5 BID, repeat ECG showing persistent prolonged QTC, will decrease Reglan to daily and eventually wean  Continue PPI-history of ulcer 16.  Sleep disturbance   Used Ambien at home.    Improving overall 17.  Hypoalbuminemia  Supplement initiated 18.  History of prolonged QTC  See #15  Zofran DC'd--patient states ineffective anyway. 19.  Hypokalemia  Potassium 3.1 on 3/12  Low-dose supplement started on 3/12  LOS: 8 days A FACE TO FACE EVALUATION WAS PERFORMED  Amy [name] Amy [name] 07/12/2020, 11:15 AM

## 2020-07-13 NOTE — Progress Notes (Signed)
Occupational Therapy Session Note  Patient Details  Name: Amy [name] MRN: [id number] Date of Birth: 30-Oct-1950  Today's Date: 07/13/2020 OT Individual Time: 1100-1155 OT Individual Time Calculation (min): 55 min    Short Term Goals: Week 1:  OT Short Term Goal 1 (Week 1): Pt will complete toilet transfer with CGA from w/c. OT Short Term Goal 2 (Week 1): Pt will complete 2/3 tasks of toileting with close S. OT Short Term Goal 3 (Week 1): Pt will complete standing ADL for >5 min with CGA. OT Short Term Goal 4 (Week 1): Pt will don pants with overall min A.  Skilled Therapeutic Interventions/Progress Updates:    Pt resting in bed upon arrival and agreeable to getting OOB for therapy. Pt declined bathing/dressing this morning. OT intervention with focus on bed mobility, sit<>stand, functional transfers with RW, functional amb with RW, and BUE gross motor and [employer] tasks for strengthening and dexterity. Bed mobility with supervision. Sit<>stand and transfer to w/c with CGA. Pt transitioned to gym and engaged in table tasks with focus on hand strengthening and dexterity. Pt initially completed activity with tan theraputty and small beads and transitioned to yellow theraputty. Focus primarily on grasp strength and isolated finger strength. Pt also completed graded clothes pins task using red, blue, and green clothes pins to place and remove from dowel. Pt required rest breaks during tasks when hands fatigued. Pt returned to room and amb with RW in room to sit EOB. Sit>supine with supervision. Pt remained in bed with all needs within reach and bed alarm activated.   Therapy Documentation Precautions:  Precautions Precautions: Fall Precaution Comments: generalized weakness, peritoneal diaylisis, seizures, monitor O2 during ambulation Restrictions Weight Bearing Restrictions: No    Pain: Pt denies pain this morning ADL: ADL Eating: Supervision/safety Where Assessed-Eating: Edge of  bed Grooming: Supervision/safety Where Assessed-Grooming: Sitting at sink Upper Body Bathing: Supervision/safety Where Assessed-Upper Body Bathing: Edge of bed,Sitting at sink Lower Body Bathing: Minimal assistance Where Assessed-Lower Body Bathing: Sitting at sink,Edge of bed Upper Body Dressing: Supervision/safety Where Assessed-Upper Body Dressing: Edge of bed,Sitting at sink Lower Body Dressing: Moderate assistance Where Assessed-Lower Body Dressing: Edge of bed,Sitting at sink Toileting: Moderate assistance Where Assessed-Toileting: Glass blower/designer: Psychiatric nurse Method: Arts development officer: Radiographer, therapeutic: Minimal Museum/gallery conservator Method: Stand pivot Tub/Shower Equipment: Shower seat with back,Grab bars,Walk in shower   Therapy/Group: Individual Therapy  Leroy Libman 07/13/2020, 12:07 PM

## 2020-07-13 NOTE — Progress Notes (Signed)
[facility] Progress Note   Subjective:  Seen in room. No complaints.  Doing well with PD.   Objective Vitals:   07/12/20 0800 07/12/20 1612 07/12/20 1954 07/13/20 0420  BP:  101/65 107/68 106/66  Pulse:  86 62 81  Resp: 16 18 18 18   Temp:  98 F (36.7 C) 98.3 F (36.8 C) 98.5 F (36.9 C)  TempSrc:      SpO2:  100% 100% 100%  Weight:    44.3 kg  Height:         Additional Objective Labs: Basic Metabolic Panel: Recent Labs  Lab 07/07/20 0448 07/12/20 0503  NA 128* 127*  K 3.0* 3.1*  CL 92* 91*  CO2 23 24  GLUCOSE 101* 89  BUN 19 15  CREATININE 4.76* 4.41*  CALCIUM 8.6* 7.7*  PHOS 3.7 3.3   CBC: Recent Labs  Lab 07/07/20 0448  WBC 6.3  HGB 10.6*  HCT 30.7*  MCV 92.7  PLT 294   Blood Culture    Component Value Date/Time   SDES BLOOD BLOOD RIGHT HAND 05/29/2020 2043   SPECREQUEST  05/29/2020 2043    BOTTLES DRAWN AEROBIC AND ANAEROBIC Blood Culture adequate volume   CULT  05/29/2020 2043    NO GROWTH 5 DAYS Performed at [facility], [address] [place], [place] [address]    REPTSTATUS 06/03/2020 FINAL 05/29/2020 2043     Physical Exam General: Well appearing,nad Heart: RRR no m,r,g Lungs: Clear bilaterally  Abdomen: soft ntnd Extremities: No LE edema  Dialysis Access: PD cath in place dsg c,d,i  Medications: . dialysis solution 1.5% low-MG/low-CA     . aspirin  81 mg Oral Daily  . atorvastatin  80 mg Oral q1800  . Chlorhexidine Gluconate Cloth  6 each Topical Daily  . cinacalcet  30 mg Oral Q breakfast  . clopidogrel  75 mg Oral Daily  . [START ON 07/19/2020] darbepoetin (ARANESP) injection - NON-DIALYSIS  100 mcg Subcutaneous Q Sat-1800  . feeding supplement  1 Container Oral TID BM  . gentamicin cream  1 application Topical Daily  . levETIRAcetam  500 mg Oral BID  . metoCLOPramide  5 mg Oral Daily  . metoprolol succinate  25 mg Oral Daily  . multivitamin  1 tablet Oral QHS  . pantoprazole  40 mg Oral  Daily  . potassium chloride  10 mEq Oral BID  . sodium chloride flush  3 mL Intravenous Q12H  . torsemide  100 mg Oral BID    OP PD: [place] followed by Dr. Candiss Norse. 4 exchanges overnight, 2000 cc and 1.5 hrs each.     Assessment/ Plan: 1. Multivessel CAD with reduced EF 25/30%: Eval by TCTS, is"poor CABG candidate". s/p PCI to LAD with DESon 2/25.getting Plavix and ASA 81 mg,+ statin 2. Debility: 2/2 Multiple med issues mainly CAD > now on CIR 3. ESRD - Continue [name]. Stable. On  KCl 10 bid.  4. HTN/volume -BPin goal,on metoprolol 25 mg daily. When using 2.5% pt c/o feeling "dizzy" > so now doing all 1.5% bags. Tolerating well. On torsemide for volume  5. Anemia -Hgb 9- 11 range. s/p 1unitPRBCon 2/20 and on 2/27. Continue Aranesp 100 MCG q Saturday. tsat 91%,ferritin 1131. No need for iron at this time. 6. Secondary hyperparathyroidism: Phos at goal, no binders.  On Sensipar 30/d,stoppedRocaltrol with elevatedcorrectedcalcium>10, last phos check was 3 7. Hyponatremia -Chronic  8. Nutrition: Albumin low 1.7. On regular diet with prot supp.  9.  Seizures - do to electrolyte abnormality in setting of C diff. Now on Keppra per neuro. No breakthrough seizures since admission to CIR   10. C diff - finished ABX course on 2/27. 11. Disposition-CIR admit for rehab   Lynnda Child PA-C [facility] 07/13/2020,11:08 AM

## 2020-07-14 DIAGNOSIS — R5381 Other malaise: Secondary | ICD-10-CM | POA: Diagnosis not present

## 2020-07-14 LAB — CBC WITH DIFFERENTIAL/PLATELET
Abs Immature Granulocytes: 0.03 10*3/uL (ref 0.00–0.07)
Basophils Absolute: 0.1 10*3/uL (ref 0.0–0.1)
Basophils Relative: 1 %
Eosinophils Absolute: 0.3 10*3/uL (ref 0.0–0.5)
Eosinophils Relative: 4 %
HCT: 30.6 % — ABNORMAL LOW (ref 36.0–46.0)
Hemoglobin: 10.4 g/dL — ABNORMAL LOW (ref 12.0–15.0)
Immature Granulocytes: 0 %
Lymphocytes Relative: 19 %
Lymphs Abs: 1.4 10*3/uL (ref 0.7–4.0)
MCH: 32.6 pg (ref 26.0–34.0)
MCHC: 34 g/dL (ref 30.0–36.0)
MCV: 95.9 fL (ref 80.0–100.0)
Monocytes Absolute: 0.8 10*3/uL (ref 0.1–1.0)
Monocytes Relative: 11 %
Neutro Abs: 4.6 10*3/uL (ref 1.7–7.7)
Neutrophils Relative %: 65 %
Platelets: 252 10*3/uL (ref 150–400)
RBC: 3.19 MIL/uL — ABNORMAL LOW (ref 3.87–5.11)
RDW: 22 % — ABNORMAL HIGH (ref 11.5–15.5)
WBC: 7.1 10*3/uL (ref 4.0–10.5)
nRBC: 0 % (ref 0.0–0.2)

## 2020-07-14 MED ORDER — POTASSIUM CHLORIDE CRYS ER 10 MEQ PO TBCR
10.0000 meq | EXTENDED_RELEASE_TABLET | Freq: Three times a day (TID) | ORAL | Status: DC
  Administered 2020-07-14 – 2020-07-23 (×28): 10 meq via ORAL
  Filled 2020-07-14 (×28): qty 1

## 2020-07-14 MED ORDER — TOPIRAMATE 25 MG PO TABS
25.0000 mg | ORAL_TABLET | Freq: Every day | ORAL | Status: DC
  Administered 2020-07-14 – 2020-07-16 (×3): 25 mg via ORAL
  Filled 2020-07-14 (×3): qty 1

## 2020-07-14 NOTE — Progress Notes (Signed)
Received a call from [facility] reporting Ms. Weinkauf experiencing nausea. Dr Ranell Patrick note was reviewed. Her Reglan was decreased today due to prolong QTC and Zofran was discontinued patient stated it was ineffective and due to prolong QTC. This provider spoke with pharmacist regarding the above. He suggested Hydroxyzine 50 mg IM. Discussed the above with Ms. Ullin, she will discussed with Ms. Mcgowen. We will continue to monitor.

## 2020-07-14 NOTE — Progress Notes (Signed)
PROGRESS NOTE   Subjective/Complaints: Patient's chart reviewed- No issues reported overnight Vitals signs stable No acute complaints.  She mentions her chronic headaches. Discussed Topiramate for headache prevention and she is willing to try.   Review of systems: Denies CP, shortness of breath, nausea, vomiting, diarrhea. +chronic headaches  Objective:   No results found. Recent Labs    07/14/20 0453  WBC 7.1  HGB 10.4*  HCT 30.6*  PLT 252   Recent Labs    07/12/20 0503  NA 127*  K 3.1*  CL 91*  CO2 24  GLUCOSE 89  BUN 15  CREATININE 4.41*  CALCIUM 7.7*    Intake/Output Summary (Last 24 hours) at 07/14/2020 1204 Last data filed at 07/14/2020 0826 Gross per 24 hour  Intake 298 ml  Output 2 ml  Net 296 ml        Physical Exam: Vital Signs Blood pressure 103/62, pulse 82, temperature 98.2 F (36.8 C), temperature source Oral, resp. rate 17, height 5\' 4"  (1.626 m), weight 43.4 kg, SpO2 99 %. Gen: no distress, normal appearing HEENT: oral mucosa pink and moist, NCAT Cardio: Reg rate Chest: normal effort, normal rate of breathing Abd: soft, non-distended Ext: no edema Psych: Normal mood.  Normal behavior. Musc: No edema in extremities.  No tenderness in extremities. Neurologic: Alert Motor: 4/5 throughout, stable  Assessment/Plan: 1. Functional deficits which require 3+ hours per day of interdisciplinary therapy in a comprehensive inpatient rehab setting.  Physiatrist is providing close team supervision and 24 hour management of active medical problems listed below.  Physiatrist and rehab team continue to assess barriers to discharge/monitor patient progress toward functional and medical goals  Care Tool:  Bathing    Body parts bathed by patient: Right arm,Left arm,Chest,Abdomen,Front perineal area,Right upper leg,Left upper leg,Face,Right lower leg,Left lower leg,Buttocks   Body parts bathed by  helper: Buttocks     Bathing assist Assist Level: Contact Guard/Touching assist     Upper Body Dressing/Undressing Upper body dressing   What is the patient wearing?: Pull over shirt    Upper body assist Assist Level: Set up assist    Lower Body Dressing/Undressing Lower body dressing      What is the patient wearing?: Pants,Underwear/pull up     Lower body assist Assist for lower body dressing: Contact Guard/Touching assist     Toileting Toileting    Toileting assist Assist for toileting: Contact Guard/Touching assist     Transfers Chair/bed transfer  Transfers assist     Chair/bed transfer assist level: Contact Guard/Touching assist     Locomotion Ambulation   Ambulation assist      Assist level: Contact Guard/Touching assist Assistive device: Walker-rolling Max distance: 60   Walk 10 feet activity   Assist  Walk 10 feet activity did not occur: Safety/medical concerns  Assist level: Contact Guard/Touching assist Assistive device: Walker-rolling   Walk 50 feet activity   Assist Walk 50 feet with 2 turns activity did not occur: Safety/medical concerns  Assist level: Contact Guard/Touching assist Assistive device: Walker-rolling    Walk 150 feet activity   Assist Walk 150 feet activity did not occur: Safety/medical concerns  Walk 10 feet on uneven surface  activity   Assist Walk 10 feet on uneven surfaces activity did not occur: Safety/medical concerns         Wheelchair     Assist Will patient use wheelchair at discharge?: Yes Type of Wheelchair: Manual Wheelchair activity did not occur: Safety/medical concerns  Wheelchair assist level: Minimal Assistance - Patient > 75% Max wheelchair distance: 120'    Wheelchair 50 feet with 2 turns activity    Assist    Wheelchair 50 feet with 2 turns activity did not occur: Safety/medical concerns   Assist Level: Minimal Assistance - Patient > 75%   Wheelchair  150 feet activity     Assist  Wheelchair 150 feet activity did not occur: Safety/medical concerns       Medical Problem List and Plan: 1.Functional and mobility deficitssecondary to debility after multiple medical issues-CHF, hx of C diff colitis, recent R hip fx 12/21 with ORIF  Continue CIR  2. Antithrombotics: -DVT/anticoagulation:Mechanical:Antiembolism stockings, knee (TED hose) Bilateral lower extremities Sequential compression devices, below kneeBilateral lower extremities -antiplatelet therapy: ASA/plavix 3.Chronic daily frequent HA/Pain Management:tylenol for basic pain -fioricet for breakthrough headaches (on this PTA)  Controlled with meds on 3/14  Start topirmate 25mg  daily for headache prevention.  4. Mood:fairly positive. Team to provide ego support as needed -antipsychotic agents: n/a 5. Neuropsych: This patientiscapable of making decisions on herown behalf. 6. Skin/Wound Care:Routine pressure relief measures. 7. Fluids/Electrolytes/Nutrition:Monitor I/Os.  8. ESRD: Continue daily PD, dialysis doing well at this point. 9. CAD s/p PCI: On ASA/Plavix, metoprolol and lipitor. Monitor for symptoms with increase in activity.  10. New onset seizures: On Keppra bid Off Wellbutrin. Monitor for anxiety symptoms.  No breakthrough seizures since admission to CIR-3/12 11. H/o A flutter: Monitor HR tid--continue metoprolol. 12. Anemia of chronic disease: Monitor with serial H/H--improved post transfusion.  --On Aranesp weekly  Hemoglobin 10.4 on 3/14, repeat with PD.   Hemoccult negative   Continue to monitor 13. C diff colitis: Completed treatment with Dificid on 02/27 -off contact precautions 14. Chronic combined CHF: Takatsubo CM with EF--25-30% -- Monitor weights daily.  --On demadex bid with Kdur for supplement as well as metoprolol and  statin. Filed Weights   07/12/20 0500 07/13/20 0420 07/14/20 0500  Weight: 44.3 kg 44.3 kg 43.4 kg   Stable on 3/14 15.  Nausea, recent C. difficile colitis, end-stage renal disease  Suspect this is chronic given PD, electrolyte abnormalities, confirmed by daughter.  However, patient states this is new.  KUB ordered, showing oral contrast throughout the colon, otherwise relatively unremarkable  Improving, plan to repeat KUB in 1-2 days.  Continue Reglan 5 BID, repeat ECG showing persistent prolonged QTC, will decrease Reglan to daily and eventually wean  Continue PPI-history of ulcer 16.  Sleep disturbance   Used Ambien at home.    Improving overall 17.  Hypoalbuminemia  Supplement initiated 18.  History of prolonged QTC  See #15  Zofran DC'd--patient states ineffective anyway. 19.  Hypokalemia  Potassium 3.1 on 3/14- increase K+ as per nephro note  Low-dose supplement started on 3/12  LOS: 10 days A FACE TO FACE EVALUATION WAS PERFORMED  Martha Clan P Natasja Niday 07/14/2020, 12:04 PM

## 2020-07-14 NOTE — Progress Notes (Signed)
Physical Therapy Session Note  Patient Details  Name: Amy [name] MRN: [id number] Date of Birth: 1950/05/27  Today's Date: 07/14/2020 PT Individual Time: 1100-1157 PT Individual Time Calculation (min): 57 min   Short Term Goals: Week 2:  PT Short Term Goal 1 (Week 2): Patient will ambulate 120' consistently with CGA with RW. PT Short Term Goal 2 (Week 2): Patient will demonstrate standing dynamic balance with CGA using 1 hand support for functional activities. PT Short Term Goal 3 (Week 2): Patient will negotiate 4 steps with rail and CGA. PT Short Term Goal 4 (Week 2): Patient to tolerate standing activity for 10 minutes without seated rest.  Skilled Therapeutic Interventions/Progress Updates:     Pt received supine in bed, awake and agreeable to therapy - no reports of pain but reports she hasn't gotten out of bed yet (when asked on toileting, she reports nursing has been using a bed pan.Marland KitchenMarland Kitchen?) Encouraged her to begin transitioning off bed pan and using bathroom within her room (with staff assist) and she voiced understanding. Performed supine<>sit with supervision with HOB elevated. While seated EOB she donned pants, long sleeve shirt, and socks with setupA. She used slip-on shoes during session. Sit<>stand with close supervision to RW and she ambulated a few feet to the sink with CGA and RW - performed hand hgyiene, brushed her teeth, and washed her face with CGA; she does rely on UE support to sink while completing these tasks. W/c transport to day room gym for time management. Completed Normal TUG & Cognitive TUG (serial casting by 2's and 5's) with CGA and RW (see results below). Cognitive TUG 10 seconds longer than Normal TUG. TUG scores indicative of increased falls risk. Gait x166ft with CGA and RW - demo's forward flexed trunk and decreased heel strike bilaterally, cues to correct. After ~122ft, she c/o mild lightheadedness and requested to sit down. Lightheadedness resolves after a few  minutes of seated rest. Attempted to retreive BP cuff but unable to locate a pediatric size - asked nurse secretary to order for patient. Patient performed seated there-ex with 4# dowel rod inclduing shldr press, chest press, and bicep curls, 2x10 reps each. She was returned to her room and was agreeable to remain seated in w/c for lunch. Chair alarm on and needs within reach.  Therapy Documentation Precautions:  Precautions Precautions: Fall Precaution Comments: generalized weakness, peritoneal diaylisis, seizures, monitor O2 during ambulation Restrictions Weight Bearing Restrictions: No   Balance: Balance Balance Assessed: Yes Standardized Balance Assessment Standardized Balance Assessment: Timed Up and Go Test Timed Up and Go Test TUG: Cognitive TUG;Normal TUG Normal TUG (seconds): 47 Cognitive TUG (seconds): 57  Therapy/Group: Individual Therapy  Kaniesha Barile P Adonte Vanriper PT 07/14/2020, 7:00 AM

## 2020-07-14 NOTE — Progress Notes (Signed)
Occupational Therapy Session Note  Patient Details  Name: Amy [name] MRN: [id number] Date of Birth: 1950/07/27  Today's Date: 07/14/2020 OT Individual Time: 1300-1400 OT Individual Time Calculation (min): 60 min    Short Term Goals: Week 1:  OT Short Term Goal 1 (Week 1): Pt will complete toilet transfer with CGA from w/c. OT Short Term Goal 2 (Week 1): Pt will complete 2/3 tasks of toileting with close S. OT Short Term Goal 3 (Week 1): Pt will complete standing ADL for >5 min with CGA. OT Short Term Goal 4 (Week 1): Pt will don pants with overall min A.  Skilled Therapeutic Interventions/Progress Updates:    Patient seated in w/c, ready for therapy session.  She denies pain and requests grooming tasks and hair washing this session.  Patient able to complete oral care, face washing and hair care after assistance to wash hair using shampoo tray.  She completed trunk mobility, reaching activities with bilateral arm ROM activity, design copy task without difficulty.  Sit to stand and short distance ambulation with RW CGA.  Sit to supine on bed with CS.  She remained in bed at close of session with bed alarm set and call bell in hand.    Therapy Documentation Precautions:  Precautions Precautions: Fall Precaution Comments: generalized weakness, peritoneal diaylisis, seizures, monitor O2 during ambulation Restrictions Weight Bearing Restrictions: No   Therapy/Group: Individual Therapy  Carlos Levering 07/14/2020, 7:26 AM

## 2020-07-14 NOTE — Progress Notes (Signed)
Physical Therapy Session Note  Patient Details  Name: Amy [name] MRN: [id number] Date of Birth: 1951-03-16  Today's Date: 07/14/2020 PT Individual Time: 1445-1530 PT Individual Time Calculation (min): 45 min   Short Term Goals: Week 2:  PT Short Term Goal 1 (Week 2): Patient will ambulate 120' consistently with CGA with RW. PT Short Term Goal 2 (Week 2): Patient will demonstrate standing dynamic balance with CGA using 1 hand support for functional activities. PT Short Term Goal 3 (Week 2): Patient will negotiate 4 steps with rail and CGA. PT Short Term Goal 4 (Week 2): Patient to tolerate standing activity for 10 minutes without seated rest.  Skilled Therapeutic Interventions/Progress Updates:    Patient in supine and reports feeling well having had therapies more broken up today.  Performed supine to sit with S and seated to don slippers with S.  Requesting to toilet prior to leaving the room.  Sit to stand with CGA and ambulated to bathroom with RW and CGA.  Patient completed toilet hygiene unaided, sit to stand with CGA and donned pants & underwear with CGA.  Patient ambulated x 120' with CGA with RW, then assisted in w/c to therapy gym.  Performed stair negotiation x 4 steps with rails and min A with cues for sequencing/teachnique.  Patient performed standing therex at counter consisting of hip abduction, minisquats, heel raises and marching all x 10 with CGA.  Standing for endurance to complete block sequence x 5 minutes with intermittent UE support on counter and close S to CGA.  Patient assisted in w/c to room and transfer to bed using RW and CGA.  Sit to supine with S.  Left with call bell and needs in reach and bed alarm active.   Therapy Documentation Precautions:  Precautions Precautions: Fall Precaution Comments: generalized weakness, peritoneal diaylisis, seizures, monitor O2 during ambulation Restrictions Weight Bearing Restrictions: No   Therapy/Group: Individual  Therapy  Reginia Naas  Magda Kiel, PT 07/14/2020, 2:56 PM

## 2020-07-14 NOTE — Telephone Encounter (Signed)
admitted

## 2020-07-14 NOTE — Progress Notes (Signed)
Physical Therapy Session Note  Patient Details  Name: LINDE WILENSKY MRN: [id number] Date of Birth: 01/10/1951  Today's Date: 07/14/2020 PT Individual Time: 1630-1700 PT Individual Time Calculation (min): 30 min   Short Term Goals: Week 2:  PT Short Term Goal 1 (Week 2): Patient will ambulate 120' consistently with CGA with RW. PT Short Term Goal 2 (Week 2): Patient will demonstrate standing dynamic balance with CGA using 1 hand support for functional activities. PT Short Term Goal 3 (Week 2): Patient will negotiate 4 steps with rail and CGA. PT Short Term Goal 4 (Week 2): Patient to tolerate standing activity for 10 minutes without seated rest.  Skilled Therapeutic Interventions/Progress Updates:    Pt received supine in bed, agreeable to PT session. No complaints of pain, pt does report feeling nauseous and fatigued from therapy today. Pt agreeable to therapy in her room this PM. Provided pt with ginger ale for nausea and she reports she has alerted nursing to her symptoms. Supine to sit with min A for some trunk control. Seated BLE therex 3 x 20 reps: marches, LAQ with hip add squeeze, hip add squeeze. Pt returned to supine with Supervision. Pt left seated in bed with needs in reach, bed alarm in place at end of session.  Therapy Documentation Precautions:  Precautions Precautions: Fall Precaution Comments: generalized weakness, peritoneal diaylisis, seizures, monitor O2 during ambulation Restrictions Weight Bearing Restrictions: No     Therapy/Group: Individual Therapy   Excell Seltzer, PT, DPT  07/14/2020, 5:06 PM

## 2020-07-14 NOTE — Progress Notes (Signed)
[facility] ROUNDING NOTE   Subjective:   Brief history: 70 year old lady end-stage renal disease multivessel CAD with reduced ejection fraction 25-30%.  Status post PCI to LAD with DES stent placed 06/27/2020.  She continues on [name].  Appears to be tolerating this well.  She continues on 1.5% bags with the addition of torsemide for additional volume control.  Blood pressure 103/62 pulse 82 temperature 98.2 O2 sats 99%  Sodium 127 potassium 3.1 chloride 91 CO2 24 BUN 15 creatinine 4.4 glucose 89 calcium 7.7 phosphorus 3.3 albumin 1.6 hemoglobin 10.4  Aspirin 81 mg daily, Lipitor 80 mg daily Sensipar 30 mg day Plavix 75 mg daily, Keppra 500 mg twice daily metoprolol 5 mg daily multivitamins 1 daily Protonix 40 mg daily potassium chloride 10 mEq twice daily, Demadex 100 mg twice daily   Objective:  Vital signs in last 24 hours:  Temp:  [98.1 F (36.7 C)-98.8 F (37.1 C)] 98.2 F (36.8 C) (03/14 0430) Pulse Rate:  [82-93] 82 (03/14 0430) Resp:  [16-19] 17 (03/14 0430) BP: (87-121)/(60-77) 103/62 (03/14 0430) SpO2:  [99 %-100 %] 99 % (03/14 0430) Weight:  [43.4 kg] 43.4 kg (03/14 0500)  Weight change: -0.9 kg Filed Weights   07/12/20 0500 07/13/20 0420 07/14/20 0500  Weight: 44.3 kg 44.3 kg 43.4 kg    Intake/Output: I/O last 3 completed shifts: In: 236 [P.O.:236] Out: 5 [Urine:3; Stool:2]   Intake/Output this shift:  No intake/output data recorded.  Physical Exam General: Well appearing,nad Heart: RRR no m,r,g Lungs: Clear bilaterally  Abdomen: soft ntnd Extremities: No LE edema  Dialysis Access: PD cath in place dsg c,d,i   Basic Metabolic Panel: Recent Labs  Lab 07/12/20 0503  NA 127*  K 3.1*  CL 91*  CO2 24  GLUCOSE 89  BUN 15  CREATININE 4.41*  CALCIUM 7.7*  PHOS 3.3    Liver Function Tests: Recent Labs  Lab 07/12/20 0503  ALBUMIN 1.6*   No results for input(s): LIPASE, AMYLASE in the last 168 hours. No results for input(s): AMMONIA  in the last 168 hours.  CBC: Recent Labs  Lab 07/14/20 0453  WBC 7.1  NEUTROABS 4.6  HGB 10.4*  HCT 30.6*  MCV 95.9  PLT 252    Cardiac Enzymes: No results for input(s): CKTOTAL, CKMB, CKMBINDEX, TROPONINI in the last 168 hours.  BNP: Invalid input(s): POCBNP  CBG: No results for input(s): GLUCAP in the last 168 hours.  Microbiology: Results for orders placed or performed during the hospital encounter of 06/19/20  MRSA PCR Screening     Status: None   Collection Time: 06/21/20 12:34 AM   Specimen: Nasopharyngeal  Result Value Ref Range Status   MRSA by PCR NEGATIVE NEGATIVE Final    Comment:        The GeneXpert MRSA Assay (FDA approved for NASAL specimens only), is one component of a comprehensive MRSA colonization surveillance program. It is not intended to diagnose MRSA infection nor to guide or monitor treatment for MRSA infections. Performed at [facility], [place] [address] [place], [place] [address]     Coagulation Studies: No results for input(s): LABPROT, INR in the last 72 hours.  Urinalysis: No results for input(s): COLORURINE, LABSPEC, PHURINE, GLUCOSEU, HGBUR, BILIRUBINUR, KETONESUR, PROTEINUR, UROBILINOGEN, NITRITE, LEUKOCYTESUR in the last 72 hours.  Invalid input(s): APPERANCEUR    Imaging: No results found.   Medications:   . dialysis solution 1.5% low-MG/low-CA     . aspirin  81 mg Oral Daily  .  atorvastatin  80 mg Oral q1800  . Chlorhexidine Gluconate Cloth  6 each Topical Daily  . cinacalcet  30 mg Oral Q breakfast  . clopidogrel  75 mg Oral Daily  . [START ON 07/19/2020] darbepoetin (ARANESP) injection - NON-DIALYSIS  100 mcg Subcutaneous Q Sat-1800  . feeding supplement  1 Container Oral TID BM  . gentamicin cream  1 application Topical Daily  . levETIRAcetam  500 mg Oral BID  . metoCLOPramide  5 mg Oral Daily  . metoprolol succinate  25 mg Oral Daily  . multivitamin  1 tablet Oral QHS  . pantoprazole  40 mg Oral Daily   . potassium chloride  10 mEq Oral BID  . sodium chloride flush  3 mL Intravenous Q12H  . torsemide  100 mg Oral BID   acetaminophen, aluminum hydroxide, bisacodyl, butalbital-acetaminophen-caffeine, diphenhydrAMINE, Gerhardt's butt cream, guaiFENesin-dextromethorphan, dianeal solution for CAPD/[name] with heparin, milk and molasses, polyethylene glycol, zolpidem  Assessment/ Plan:   OP FX:[facility]IT PDin [facility] followed by Dr. Candiss Norse. 4 exchanges overnight, 2000 cc and 1.5 hrs each.    Assessment/ Plan: 1. Multivessel CAD with reduced EF 25/30%: Eval by TCTS, is"poor CABG candidate". s/pPCI to LAD with DESon 2/25.gettingPlavix and ASA 81 mg,+ statin 2. Debility: 2/2 Multiple med issues mainly CAD>now on CIR 3. ESRD - Continue [name]. Stable.  Increase potassium to 3 times daily.  4. HTN/volume -BPin goal,on metoprolol 25 mg daily. When using 2.5% ptc/o feeling"dizzy" > sonow doing all 1.5% bags. Tolerating well. On torsemide for volume  5. Anemia -Hgb 9- 11 range. s/p 1unitPRBCon 2/20 and on 2/27. Continue Aranesp 100 MCG q Saturday. tsat 91%,ferritin 1131. No need for iron at this time. 6. Secondary hyperparathyroidism: Phos at goal, no binders.  On Sensipar 30/d,stoppedRocaltrol with elevatedcorrectedcalcium>10, last phos check was 3 7. Hyponatremia -Chronic  8. Nutrition: Albumin low 1.7. On regular diet with prot supp.  9. Seizures - do to electrolyte abnormality in setting of C diff. Now on Keppra per neuro. No breakthrough seizures since admission to CIR   10. C diff - finished ABX course on 2/27. 11. Disposition-CIR admit for rehab    LOS: [place] @TODAY @7 :23 AM

## 2020-07-15 DIAGNOSIS — D638 Anemia in other chronic diseases classified elsewhere: Secondary | ICD-10-CM | POA: Diagnosis not present

## 2020-07-15 DIAGNOSIS — K3184 Gastroparesis: Secondary | ICD-10-CM

## 2020-07-15 DIAGNOSIS — R5381 Other malaise: Secondary | ICD-10-CM | POA: Diagnosis not present

## 2020-07-15 DIAGNOSIS — G894 Chronic pain syndrome: Secondary | ICD-10-CM | POA: Diagnosis not present

## 2020-07-15 DIAGNOSIS — I5042 Chronic combined systolic (congestive) and diastolic (congestive) heart failure: Secondary | ICD-10-CM | POA: Diagnosis not present

## 2020-07-15 NOTE — Progress Notes (Signed)
Occupational Therapy Weekly Progress Note  Patient Details  Name: BRET STAMOUR MRN: [id number] Date of Birth: [birth year]  Beginning of progress report period: July 05, 2020 End of progress report period: July 15, 2020     Patient has met 4 of 4 short term goals.  Patient with improving mobility and tolerance for activities.    Patient continues to demonstrate the following deficits: decreased cardiorespiratoy endurance and decreased standing balance and therefore will continue to benefit from skilled OT intervention to enhance overall performance with BADL.  Patient progressing toward long term goals..  Continue plan of care.  OT Short Term Goals Week 1:  OT Short Term Goal 1 (Week 1): Pt will complete toilet transfer with CGA from w/c. OT Short Term Goal 1 - Progress (Week 1): Met OT Short Term Goal 2 (Week 1): Pt will complete 2/3 tasks of toileting with close S. OT Short Term Goal 2 - Progress (Week 1): Met OT Short Term Goal 3 (Week 1): Pt will complete standing ADL for >5 min with CGA. OT Short Term Goal 3 - Progress (Week 1): Met OT Short Term Goal 4 (Week 1): Pt will don pants with overall min A. OT Short Term Goal 4 - Progress (Week 1): Met Week 2:  OT Short Term Goal 1 (Week 2): patient will complete bathing/dressing tasks edge of bed with set up/CS OT Short Term Goal 2 (Week 2): patient will complete functional transfers CS with RW OT Short Term Goal 3 (Week 2): patient will complete basic reach and transport of items with RW CS with good carryover of strategies    Carlos Levering 07/15/2020, 3:36 PM

## 2020-07-15 NOTE — Progress Notes (Signed)
[facility] ROUNDING NOTE   Subjective:   Brief history: 70 year old lady end-stage renal disease multivessel CAD with reduced ejection fraction 25-30%.  Status post PCI to LAD with DES stent placed 06/27/2020.  She continues on [name].  Appears to be tolerating this well.  She continues on 1.5% bags with the addition of torsemide for additional volume control.  Blood pressure 113/69 pulse 79 temperature 98.4 O2 sats 99% room air  Sodium 127 potassium 3.1 chloride 91 CO2 24 BUN 15 creatinine 4.4 glucose 89 calcium 7.7 phosphorus 3.3 albumin 1.6 hemoglobin 10.4  Aspirin 81 mg daily, Lipitor 80 mg daily Sensipar 30 mg day Plavix 75 mg daily, Keppra 500 mg twice daily metoprolol 5 mg daily multivitamins 1 daily Protonix 40 mg daily potassium chloride 10 mEq twice daily, Demadex 100 mg twice daily   Objective:  Vital signs in last 24 hours:  Temp:  [97.4 F (36.3 C)-98.8 F (37.1 C)] 98.4 F (36.9 C) (03/15 0657) Pulse Rate:  [79-92] 79 (03/15 0657) Resp:  [16-17] 17 (03/14 2005) BP: (97-113)/(63-69) 113/69 (03/15 0657) SpO2:  [99 %-100 %] 99 % (03/15 0657) Weight:  [43.7 kg-48.9 kg] 43.7 kg (03/15 0657)  Weight change: 3.4 kg Filed Weights   07/14/20 1826 07/15/20 0434 07/15/20 0657  Weight: 46.8 kg 48.9 kg 43.7 kg    Intake/Output: I/O last 3 completed shifts: In: 480 [P.O.:480] Out: -    Intake/Output this shift:  No intake/output data recorded.  Physical Exam General: Well appearing,nad Heart: RRR no m,r,g Lungs: Clear bilaterally  Abdomen: soft ntnd Extremities: No LE edema  Dialysis Access: PD cath in place dsg c,d,i   Basic Metabolic Panel: Recent Labs  Lab 07/12/20 0503  NA 127*  K 3.1*  CL 91*  CO2 24  GLUCOSE 89  BUN 15  CREATININE 4.41*  CALCIUM 7.7*  PHOS 3.3    Liver Function Tests: Recent Labs  Lab 07/12/20 0503  ALBUMIN 1.6*   No results for input(s): LIPASE, AMYLASE in the last 168 hours. No results for input(s): AMMONIA  in the last 168 hours.  CBC: Recent Labs  Lab 07/14/20 0453  WBC 7.1  NEUTROABS 4.6  HGB 10.4*  HCT 30.6*  MCV 95.9  PLT 252    Cardiac Enzymes: No results for input(s): CKTOTAL, CKMB, CKMBINDEX, TROPONINI in the last 168 hours.  BNP: Invalid input(s): POCBNP  CBG: No results for input(s): GLUCAP in the last 168 hours.  Microbiology: Results for orders placed or performed during the hospital encounter of 06/19/20  MRSA PCR Screening     Status: None   Collection Time: 06/21/20 12:34 AM   Specimen: Nasopharyngeal  Result Value Ref Range Status   MRSA by PCR NEGATIVE NEGATIVE Final    Comment:        The GeneXpert MRSA Assay (FDA approved for NASAL specimens only), is one component of a comprehensive MRSA colonization surveillance program. It is not intended to diagnose MRSA infection nor to guide or monitor treatment for MRSA infections. Performed at [facility], [place] [address], [place], [place] [address]     Coagulation Studies: No results for input(s): LABPROT, INR in the last 72 hours.  Urinalysis: No results for input(s): COLORURINE, LABSPEC, PHURINE, GLUCOSEU, HGBUR, BILIRUBINUR, KETONESUR, PROTEINUR, UROBILINOGEN, NITRITE, LEUKOCYTESUR in the last 72 hours.  Invalid input(s): APPERANCEUR    Imaging: No results found.   Medications:   . dialysis solution 1.5% low-MG/low-CA     . aspirin  81 mg Oral  Daily  . atorvastatin  80 mg Oral q1800  . Chlorhexidine Gluconate Cloth  6 each Topical Daily  . cinacalcet  30 mg Oral Q breakfast  . clopidogrel  75 mg Oral Daily  . [START ON 07/19/2020] darbepoetin (ARANESP) injection - NON-DIALYSIS  100 mcg Subcutaneous Q Sat-1800  . feeding supplement  1 Container Oral TID BM  . gentamicin cream  1 application Topical Daily  . levETIRAcetam  500 mg Oral BID  . metoCLOPramide  5 mg Oral Daily  . metoprolol succinate  25 mg Oral Daily  . multivitamin  1 tablet Oral QHS  . pantoprazole  40 mg Oral Daily   . potassium chloride  10 mEq Oral TID  . sodium chloride flush  3 mL Intravenous Q12H  . topiramate  25 mg Oral Daily  . torsemide  100 mg Oral BID   acetaminophen, aluminum hydroxide, bisacodyl, butalbital-acetaminophen-caffeine, diphenhydrAMINE, Gerhardt's butt cream, guaiFENesin-dextromethorphan, dianeal solution for CAPD/[name] with heparin, milk and molasses, polyethylene glycol, zolpidem  Assessment/ Plan:   OP WT:UUEK Nickerson followed by Dr. Candiss Norse. 4 exchanges overnight, 2000 cc and 1.5 hrs each.    Assessment/ Plan: 1. Multivessel CAD with reduced EF 25/30%: Eval by TCTS, is"poor CABG candidate". s/pPCI to LAD with DESon 2/25.gettingPlavix and ASA 81 mg,+ statin 2. Debility: 2/2 Multiple med issues mainly CAD>now on CIR 3. ESRD - Continue [name]. Stable.  Increase potassium to 3 times daily.   Will need daily renal panel 4. HTN/volume -BPin goal,on metoprolol 25 mg daily. When using 2.5% ptc/o feeling"dizzy" > sonow doing all 1.5% bags. Tolerating well. On torsemide for volume  5. Anemia -Hgb 9- 11 range. s/p 1unitPRBCon 2/20 and on 2/27. Continue Aranesp 100 MCG q Saturday. tsat 91%,ferritin 1131. No need for iron at this time. 6. Secondary hyperparathyroidism: Phos at goal, no binders.  On Sensipar 30/d,stoppedRocaltrol with elevatedcorrectedcalcium>10, last phos check was 3 7. Hyponatremia -Chronic  8. Nutrition: Albumin low 1.7. On regular diet with prot supp.  9. Seizures - do to electrolyte abnormality in setting of C diff. Now on Keppra per neuro. No breakthrough seizures since admission to CIR   10. C diff - finished ABX course on 2/27. 11. Disposition-CIR admit for rehab    LOS: [place] @TODAY @7 :33 AM

## 2020-07-15 NOTE — Progress Notes (Signed)
Physical Therapy Session Note  Patient Details  Name: Amy [name] MRN: [id number] Date of Birth: [birth year]  Today's Date: 07/15/2020 PT Individual Time:Session1: 1130-1200; [name]: [id number] PT Individual Time Calculation (min): 30 min & 55 min  Short Term Goals: Week 2:  PT Short Term Goal 1 (Week 2): Patient will ambulate 120' consistently with CGA with RW. PT Short Term Goal 2 (Week 2): Patient will demonstrate standing dynamic balance with CGA using 1 hand support for functional activities. PT Short Term Goal 3 (Week 2): Patient will negotiate 4 steps with rail and CGA. PT Short Term Goal 4 (Week 2): Patient to tolerate standing activity for 10 minutes without seated rest.  Skilled Therapeutic Interventions/Progress Updates:    Session1: Patient in w/c states sat up 30 Minutes since last session.  Assisted in w/c to therapy gym for energy conservation.  Performed sit to stand with S and ambulated 10' to stairs and performed stair negotiation x 4 (6") steps with rails and min A cues for sequence. Noted improved stability with descent compared to last time practiced.  She ambulated 20' to mat and performed sit to supine with S.  Supine bridging x 10 w/ 5 sec hold, SLR x 5 on R and 10 on L, sidelying clamshell hip abduction x 2 x 10 on each side.  Patient rolled to R with S and side to sit with S.  Patient sit to stand and stand step to w/c with S.  Pushed in w/c to room and assisted to wash hands at sink, then stand step to bed and sit to supine with S HOB elevated.  Left with call bell and needs in reach and bed alarm active.   [place]: Patient in supine and late to finish lunch due to interruptions.  States feels as if finished anyway.  Supine to sit with S then sit to stand with CGA and reported need to toilet.  Ambulated into bathroom with RW and CGA.  Doffed pants and assisted to toilet without 3:1 over.  Performed toileting and hygiene on her own.  Sit to stand mod A from low commode  and donned pants with CGA.  Patient ambulated to EOB reported not feeling well and heart racing.  Performed vitals as noted below.  RN notified and in to check on pt and delivered meds for headache.  PAtient requesting in be bed therex only so performed supine SAQ 2 x 15 w/ 2# weights, heel slides, hip abduction all in supine.  Left with all needs in reach and bed alarm active.  Ending BP charted in flowsheet as well per RN request.  Orthostatic VS for the past 24 hrs (Last 3 readings):  BP- Sitting Pulse- Sitting BP- Standing at 0 minutes Pulse- Standing at 0 minutes  07/15/20 1311 101/68 92 (!) 79/62 103    Therapy Documentation Precautions:  Precautions Precautions: Fall Precaution Comments: generalized weakness, peritoneal diaylisis, seizures, monitor O2 during ambulation Restrictions Weight Bearing Restrictions: No Pain: Pain Assessment Faces Pain Scale: No hurt   Therapy/Group: Individual Therapy  Reginia Naas  [place], PT 07/15/2020, 1:12 PM

## 2020-07-15 NOTE — Consult Note (Signed)
Neuropsychological Consultation   Patient:   Amy [name]   DOB:   08-09-1950  MR Number:  [id number]  Location:  [facility] [id number] [place] [address] Dept: [place]: [phone]           Date of Service:   07/15/2020  Start Time:   3 PM End Time:   4 PM  Provider/Observer:  Ilean Skill, Psy.D.       Clinical Neuropsychologist       Billing Code/Service: [id number]  Chief Complaint:    Amy [name] is a 70 year old female with history of end-stage renal disease, hypertension, gastritis, A.  Flutter, anxiety disorder, recent right hip fracture (04/2020), recent admission for diarrhea found to be Covid positive with diarrhea due to C. difficile.  Patient was unable to get medications for home treatment and was readmitted on 06/19/2020 with mental status changes due to brief seizure from multiple metabolic derangement from volume depletion, EEG showed nonspecific cortical dysfunction in bifrontal regions with moderate diffuse encephalopathy.  She was loaded with Keppra and found to have elevated cardiac enzymes due to non-STEMI due to demand ischemia.  Patient also underwent cardiac cath revealing severe multivessel disease and underwent PCI 2/25 as not felt to be a CABG candidate.  Patient continued to be limited by fatigue and weakness with decreased weightbearing lower left extremity, delayed processing with memory deficits affecting activity.  Because of need of dialysis unable to go to skilled nursing.  Patient has been showing improvement in activity tolerance and CIR has been implemented due to functional decline.  Reason for Service:  Patient was referred for neuropsychological consultation due to coping and adjustment issues in the setting of anxiety with significant metabolic derangement events, development of seizures and non-STEMI.  Below is the HPI for the current  mission.  Amy [name] is a 70 year old female with history of ESRD- on PD, HTN, gastritis, A flutter, anxiety d/o, R-hip fracture s/p ORIF 04/2020, recent admission for diarrhea (found to be Covid + also) due to C diff colitis -> d/c to home on oral Vanc but unable to get meds who was readmitted on 06/19/20 with mental status changes due to brief seizure from multiple metabolic derangementsfromvolume depletion. EEG showed nonspecific cortical dysfunction in bifrontal region with moderate diffuse encephalopathy. She was loaded with keppra and found to have elevated cardiac enzymes due to NSTEMI due to demand ischemia. Dr. Jory Sims recommends keppra 500 mg bid for new onset seizure with neuro follow up for adjustment or wean-->seizure provoked by multiple metabolic derangements and no driving X 6 months.   2D echo showed Takotsubo cardiomyopathy and combined systolic/diastolic. CHF being managed with PD and sinus tach managed with addition of BB. CTA chest negative for PE but showed coronary calcification. She underwent cardiac cath revealing severe multivessel disease and underwent PCI 02/25 as not felt to be CABG candidate. She developed RUE hematoma post procedure which was treated with elevation/compression and acute on chronic anemia transfused with one unit PRBC. Dr. Felizardo Hoffmann team following for input on meds which have been optimized and limited by BP. She continues to be limited by fatigue and weakness with decreased WB LLE, delayed processing with memory deficits affecting activity. Due to PD unable to go to SNF but is showing improvement in activity tolerance and CIR recommended due to functional decline.   Current Status:  Upon entering the room the patient displayed  a very pleasant and positive affect and appeared to have good mental status and cognition.  There were indications of repeating her self on a couple of occasions and problems with attention and concentration.  The patient reports  that she is improved significantly with cognitive functioning but was generally unaware of the degree of cognitive deficits at times during her hospitalization.  The patient is learning new information and it appears that overall her acute cognitive deficits have been improving significantly.  There are still ongoing issues with information processing and cognition but again they are improving significantly.  The patient reports that while she has a history of anxiety disorder this was all isolated around the time of her parents dying close to each other and adjusting to their deaths.  Patient denies any anxiety to a significant degree prior to this event and that anxiety had not been a problem when she adjusted to their passing.  The patient denies any exacerbation of anxiety at this point.  Behavioral Observation: Amy [name]  presents as a 70 y.o.-year-old Right Caucasian Female who appeared her stated age. her dress was Appropriate and she was Well Groomed and her manners were Appropriate to the situation.  her participation was indicative of Appropriate and Redirectable behaviors.  There were physical disabilities noted.  she displayed an appropriate level of cooperation and motivation.     Interactions:    Active Appropriate and Redirectable  Attention:   abnormal and attention span appeared shorter than expected for age  Memory:   abnormal; remote memory intact, recent memory impaired  Visuo-spatial:  not examined  Speech (Volume):  normal  Speech:   normal; normal  Thought Process:  Coherent and Relevant  Though Content:  WNL; not suicidal and not homicidal  Orientation:   person, place, time/date and situation  Judgment:   Good  Planning:   Fair  Affect:    Appropriate  Mood:    Euthymic  Insight:   Good  Intelligence:   high  Medical History:   Past Medical History:  Diagnosis Date  . Anemia    a. 08/2016 Guaiac + stool. EGD w/ gastritis.  . Anxiety   . CKD  (chronic kidney disease), stage III ([place])   . Closed fracture of shaft of left ulna 11/27/2018  . Colon polyp    a. 08/2016 Colonoscopy.  . Gastritis    a. 08/2016 EGD: Gastritis-->PPI.  [name] GERD (gastroesophageal reflux disease)   . History of chicken pox   . History of measles as a child   . Hypertension   . Hyponatremia    a. 08/2016 in setting of HCTZ Rx.  . Mesenteric lymphadenopathy 04/08/2017  . Multiple thyroid nodules          Patient Active Problem List   Diagnosis Date Noted  . Gastroparesis   . Chronic combined systolic and diastolic congestive heart failure ([place])   . Prolonged Q-T interval on ECG   . Nausea   . Hypoalbuminemia due to protein-calorie malnutrition ([place])   . Nausea and vomiting   . Chronic combined systolic and diastolic CHF (congestive heart failure) ([place])   . Anemia of chronic disease   . Seizures ([place])   . Chronic pain syndrome   . Debility 07/04/2020  . Ischemic cardiomyopathy   . Protein-calorie malnutrition, severe 06/23/2020  . Non-ST elevation (NSTEMI) myocardial infarction ([place])   . Pressure injury of skin 06/21/2020  . C. difficile diarrhea 06/19/2020  . C. difficile colitis 06/19/2020  .  Seizure ([place]) 06/19/2020  . Gastroenteritis due to COVID-19 virus 05/29/2020  . Surgery, elective   . S/P ORIF (open reduction internal fixation) fracture   . Closed right hip fracture, initial encounter ([place]) 04/22/2020  . Hypotension 12/16/2019  . ESRD on dialysis ([place]) 12/16/2019  . Depression with anxiety 12/16/2019  . Hypomagnesemia 12/16/2019  . Atrial flutter ([place])   . Atrial fibrillation with RVR ([place]) 04/03/2019  . Benign essential HTN 04/03/2019  . HLD (hyperlipidemia) 04/03/2019  . End stage renal disease ([place]) 12/13/2018  . Itching 11/08/2018  . Hyperparathyroidism, secondary renal ([place]) 09/13/2017  . Ground glass opacity present on imaging of lung 04/08/2017  . Osteoarthritis of knee 12/29/2016  . Normocytic anemia 10/14/2016  .  Intractable episodic headache   . Gastritis   . History of adenomatous polyp of colon 09/07/2016  . Iron deficiency anemia 09/05/2016  . Hyponatremia 09/05/2016  . Multiple thyroid nodules 07/04/2015  . Right thyroid nodule 06/27/2015  . Allergic rhinitis, seasonal 12/03/2014  . Fever blister 12/03/2014  . Herniated lumbar intervertebral disc 12/03/2014  . Hypokalemia 12/03/2014  . Recurrent sinus infections 12/03/2014  . Insomnia 11/05/2014  . Acquired scoliosis 06/26/2014  . Intervertebral disc stenosis of neural canal of lumbar region 06/20/2014  . Lumbar facet arthropathy 05/21/2014  . Degenerative spondylolisthesis 03/12/2014  . OP (osteoporosis) 08/10/2013  . Idiopathic peripheral neuropathy 03/24/2009  . Edema 12/27/2007  . Headache, tension-type 10/25/2006  . Essential hypertension 10/24/2006  . Anxiety 05/03/1998  . Esophageal reflux 05/03/1998    Psychiatric History:  Situational anxiety state developed after the death of her parents but denies significant ongoing history of anxiety.  Family Med/Psych History:  Family History  Problem Relation Age of Onset  . Pancreatic cancer Father   . Congestive Heart Failure Maternal Grandmother   . Cancer Paternal Grandfather   . Heart disease Maternal Uncle     Impression/DX:  Amy [name] is a 70 year old female with history of end-stage renal disease, hypertension, gastritis, A.  Flutter, anxiety disorder, recent right hip fracture (04/2020), recent admission for diarrhea found to be Covid positive with diarrhea due to C. difficile.  Patient was unable to get medications for home treatment and was readmitted on 06/19/2020 with mental status changes due to brief seizure from multiple metabolic derangement from volume depletion, EEG showed nonspecific cortical dysfunction in bifrontal regions with moderate diffuse encephalopathy.  She was loaded with Keppra and found to have elevated cardiac enzymes due to non-STEMI due to demand  ischemia.  Patient also underwent cardiac cath revealing severe multivessel disease and underwent PCI 2/25 as not felt to be a CABG candidate.  Patient continued to be limited by fatigue and weakness with decreased weightbearing lower left extremity, delayed processing with memory deficits affecting activity.  Because of need of dialysis unable to go to skilled nursing.  Patient has been showing improvement in activity tolerance and CIR has been implemented due to functional decline.  Upon entering the room the patient displayed a very pleasant and positive affect and appeared to have good mental status and cognition.  There were indications of repeating her self on a couple of occasions and problems with attention and concentration.  The patient reports that she is improved significantly with cognitive functioning but was generally unaware of the degree of cognitive deficits at times during her hospitalization.  The patient is learning new information and it appears that overall her acute cognitive deficits have been improving significantly.  There are still ongoing issues  with information processing and cognition but again they are improving significantly.  The patient reports that while she has a history of anxiety disorder this was all isolated around the time of her parents dying close to each other and adjusting to their deaths.  Patient denies any anxiety to a significant degree prior to this event and that anxiety had not been a problem when she adjusted to their passing.  The patient denies any exacerbation of anxiety at this point.  Disposition/Plan:  Today we worked on coping and adjustment issues with long hospitalization and numerous medical issues with significant kidney function complications and metabolic disruption with at least one seizure event all developing after right hip fracture and subsequent development of C. difficile.  Diagnosis:    Nausea - Plan: DG Abd 1 View, DG Abd 1  View         Electronically Signed   _______________________ Ilean Skill, Psy.D. Clinical Neuropsychologist

## 2020-07-15 NOTE — Progress Notes (Signed)
PROGRESS NOTE   Subjective/Complaints: Patient seen sitting up in bed this morning.  She states she slept well overnight.  She states she had a question but forgot.  Urged patient to write down questions.  She is questions regarding discharge date again.  Discussed DC Reglan now that nausea has improved.  She was seen by nephrology yesterday, notes reviewed-potassium supplement increase.  Review of systems: Denies CP, shortness of breath, nausea, vomiting, diarrhea.   Objective:   No results found. Recent Labs    07/14/20 0453  WBC 7.1  HGB 10.4*  HCT 30.6*  PLT 252   No results for input(s): NA, K, CL, CO2, GLUCOSE, BUN, CREATININE, CALCIUM in the last 72 hours.  Intake/Output Summary (Last 24 hours) at 07/15/2020 0910 Last data filed at 07/14/2020 1819 Gross per 24 hour  Intake 300 ml  Output --  Net 300 ml        Physical Exam: Vital Signs Blood pressure 113/69, pulse 79, temperature 98.4 F (36.9 C), temperature source Oral, resp. rate 17, height 5\' 4"  (1.626 m), weight 43.7 kg, SpO2 99 %. Constitutional: No distress . Vital signs reviewed. HENT: Normocephalic.  Atraumatic. Eyes: EOMI. No discharge. Cardiovascular: No JVD.  RRR. Respiratory: Normal effort.  No stridor.  Bilateral clear to auscultation. GI: Non-distended.  BS +. Skin: Warm and dry.  Intact. Scattered bruising. Psych: Normal mood.  Normal behavior. Musc: No edema in extremities.  No tenderness in extremities. Neurologic: Alert Motor: 4/5 throughout, unchanged  Assessment/Plan: 1. Functional deficits which require 3+ hours per day of interdisciplinary therapy in a comprehensive inpatient rehab setting.  Physiatrist is providing close team supervision and 24 hour management of active medical problems listed below.  Physiatrist and rehab team continue to assess barriers to discharge/monitor patient progress toward functional and medical  goals  Care Tool:  Bathing    Body parts bathed by patient: Right arm,Left arm,Chest,Abdomen,Front perineal area,Right upper leg,Left upper leg,Face,Right lower leg,Left lower leg,Buttocks   Body parts bathed by helper: Buttocks     Bathing assist Assist Level: Contact Guard/Touching assist     Upper Body Dressing/Undressing Upper body dressing   What is the patient wearing?: Pull over shirt    Upper body assist Assist Level: Set up assist    Lower Body Dressing/Undressing Lower body dressing      What is the patient wearing?: Pants,Underwear/pull up     Lower body assist Assist for lower body dressing: Contact Guard/Touching assist     Toileting Toileting    Toileting assist Assist for toileting: Contact Guard/Touching assist     Transfers Chair/bed transfer  Transfers assist     Chair/bed transfer assist level: Contact Guard/Touching assist     Locomotion Ambulation   Ambulation assist      Assist level: Contact Guard/Touching assist Assistive device: Walker-rolling Max distance: 120'   Walk 10 feet activity   Assist  Walk 10 feet activity did not occur: Safety/medical concerns  Assist level: Contact Guard/Touching assist Assistive device: Walker-rolling   Walk 50 feet activity   Assist Walk 50 feet with 2 turns activity did not occur: Safety/medical concerns  Assist level: Contact Guard/Touching assist Assistive device:  Walker-rolling    Walk 150 feet activity   Assist Walk 150 feet activity did not occur: Safety/medical concerns         Walk 10 feet on uneven surface  activity   Assist Walk 10 feet on uneven surfaces activity did not occur: Safety/medical concerns         Wheelchair     Assist Will patient use wheelchair at discharge?: Yes Type of Wheelchair: Manual Wheelchair activity did not occur: Safety/medical concerns  Wheelchair assist level: Minimal Assistance - Patient > 75% Max wheelchair distance:  120'    Wheelchair 50 feet with 2 turns activity    Assist    Wheelchair 50 feet with 2 turns activity did not occur: Safety/medical concerns   Assist Level: Minimal Assistance - Patient > 75%   Wheelchair 150 feet activity     Assist  Wheelchair 150 feet activity did not occur: Safety/medical concerns       Medical Problem List and Plan: 1.Functional and mobility deficitssecondary to debility after multiple medical issues-CHF, hx of C diff colitis, recent R hip fx 12/21 with ORIF  Continue CIR  2. Antithrombotics: -DVT/anticoagulation:Mechanical:Antiembolism stockings, knee (TED hose) Bilateral lower extremities Sequential compression devices, below kneeBilateral lower extremities -antiplatelet therapy: ASA/plavix 3.Chronic daily frequent HA/Pain Management:tylenol for basic pain -fioricet for breakthrough headaches (on this PTA)  Controlled with meds on 3/15  Start topirmate 25mg  daily for headache prevention.  4. Mood:fairly positive. Team to provide ego support as needed -antipsychotic agents: n/a 5. Neuropsych: This patientiscapable of making decisions on herown behalf. 6. Skin/Wound Care:Routine pressure relief measures. 7. Fluids/Electrolytes/Nutrition:Monitor I/Os.  8. ESRD: Continue daily PD, dialysis doing well at this point. 9. CAD s/p PCI: On ASA/Plavix, metoprolol and lipitor. Monitor for symptoms with increase in activity.  10. New onset seizures: On Keppra bid Off Wellbutrin. Monitor for anxiety symptoms.  No breakthrough seizures since admission to CIR-3/15 11. H/o A flutter: Monitor HR tid--continue metoprolol. 12. Anemia of chronic disease: Monitor with serial H/H--improved post transfusion.  --On Aranesp weekly  Hemoglobin 10.4 on 3/14  Hemoccult negative   Continue to monitor 13. C diff colitis: Completed treatment with Dificid on 02/27 -off  contact precautions 14. Chronic combined CHF: Takatsubo CM with EF--25-30% -- Monitor weights daily.  --On demadex bid with Kdur for supplement as well as metoprolol and statin. Filed Weights   07/14/20 1826 07/15/20 0434 07/15/20 0657  Weight: 46.8 kg 48.9 kg 43.7 kg   ?  Reliability on 3/15 15.  Nausea, recent C. difficile colitis, end-stage renal disease, gastroparesis  Suspect this is chronic given PD, electrolyte abnormalities, confirmed by daughter.  However, patient states this is new.  KUB ordered, showing oral contrast throughout the colon, otherwise relatively unremarkable  Continue Reglan 5 BID, repeat ECG showing persistent prolonged QTC, DC Reglan given improvement  Continue PPI-history of ulcer 16.  Sleep disturbance   Used Ambien at home.    Improving overall 17.  Hypoalbuminemia  Supplement initiated 18.  History of prolonged QTC  See #15  Zofran DC'd--patient states ineffective anyway. 19.  Hypokalemia  Potassium 3.1 on 3/14  Supplements being adjusted by nephrology-appreciate recommendations.  LOS: 11 days A FACE TO FACE EVALUATION WAS PERFORMED  Amy [name] 07/15/2020, 9:10 AM

## 2020-07-15 NOTE — Progress Notes (Signed)
Occupational Therapy Session Note  Patient Details  Name: Amy [name] MRN: [id number] Date of Birth: [birth year]  Today's Date: 07/15/2020 OT Individual Time: 1000-1100   &   1415-1445 OT Individual Time Calculation (min): 60 min    &   30 min   Short Term Goals: Week 1:  OT Short Term Goal 1 (Week 1): Pt will complete toilet transfer with CGA from w/c. OT Short Term Goal 2 (Week 1): Pt will complete 2/3 tasks of toileting with close S. OT Short Term Goal 3 (Week 1): Pt will complete standing ADL for >5 min with CGA. OT Short Term Goal 4 (Week 1): Pt will don pants with overall min A.  Skilled Therapeutic Interventions/Progress Updates:    AM session:   Patient in bed, alert and ready for therapy session.  She denies pain.  Supine to sitting edge of bed with CS.  Completed change of OH shirt, pants and slippers seated edge of bed with set up/CS, clothing management in stance with CS.  Sit to stand and short distance ambulation with RW to/from bed, w/c with CS.  Reviewed energy conservation activities/suggestions for home.  Reviewed and practiced safe reach and transport of items in kitchen environment using RW - she was able to complete with CS/CGA and moderate cues for walker and hand placement.  Standing tolerance 4-5 minutes.  Completed unsupported sitting UB and trunk mobility activities with rest breaks between.  She remained seated in w/c at close of session, seat alarm set and call bell in hand     PM session:   Patient in bed, alert, states that she is having some low back discomfort but in control.  She relayed that her blood pressure was low in prior session which is why she is back in bed.  Current BP at reclined position in bed 107/67.  Remained in bed for this session which she tolerated without difficulty.  Completed light upperbody exercises and verbally reviewed HM strategies covered this am.  Bed alarm set and call bell in hand.       Therapy Documentation Precautions:   Precautions Precautions: Fall Precaution Comments: generalized weakness, peritoneal diaylisis, seizures, monitor O2 during ambulation Restrictions Weight Bearing Restrictions: No   Therapy/Group: Individual Therapy  Carlos Levering 07/15/2020, 7:30 AM

## 2020-07-16 LAB — RENAL FUNCTION PANEL
Albumin: 1.6 g/dL — ABNORMAL LOW (ref 3.5–5.0)
Anion gap: 13 (ref 5–15)
BUN: 17 mg/dL (ref 8–23)
CO2: 23 mmol/L (ref 22–32)
Calcium: 7.8 mg/dL — ABNORMAL LOW (ref 8.9–10.3)
Chloride: 92 mmol/L — ABNORMAL LOW (ref 98–111)
Creatinine, Ser: 4.14 mg/dL — ABNORMAL HIGH (ref 0.44–1.00)
GFR, Estimated: 11 mL/min — ABNORMAL LOW (ref >60)
Glucose, Bld: 80 mg/dL (ref 70–99)
Phosphorus: 3.4 mg/dL (ref 2.5–4.6)
Potassium: 3.5 mmol/L (ref 3.5–5.1)
Sodium: 128 mmol/L — ABNORMAL LOW (ref 135–145)

## 2020-07-16 MED ORDER — PROCHLORPERAZINE EDISYLATE 10 MG/2ML IJ SOLN: 10.0000 mg | Freq: Four times a day (QID) | INTRAMUSCULAR | Status: DC | PRN

## 2020-07-16 MED ORDER — PROCHLORPERAZINE MALEATE 5 MG PO TABS
10.0000 mg | ORAL_TABLET | Freq: Four times a day (QID) | ORAL | Status: DC | PRN
  Administered 2020-07-16 – 2020-07-23 (×12): 10 mg via ORAL
  Filled 2020-07-16 (×14): qty 2

## 2020-07-16 NOTE — Progress Notes (Signed)
[facility] ROUNDING NOTE   Subjective:   Brief history: 70 year old lady end-stage renal disease multivessel CAD with reduced ejection fraction 25-30%.  Status post PCI to LAD with DES stent placed 06/27/2020.  She continues on [name].  Appears to be tolerating this well.  She continues on 1.5% bags with the addition of torsemide for additional volume control.  Blood pressure 96/60 pulse 80 temperature 98.4 O2 sats 100% room air  Sodium 127 potassium 3.1 chloride 91 CO2 24 BUN 15 creatinine 4.4 glucose 89 calcium 7.7 phosphorus 3.3 albumin 1.6 hemoglobin 10.4  Aspirin 81 mg daily, Lipitor 80 mg daily Sensipar 30 mg day Plavix 75 mg daily, Keppra 500 mg twice daily metoprolol 5 mg daily multivitamins 1 daily Protonix 40 mg daily potassium chloride 10 mEq twice daily, Demadex 100 mg twice daily   Objective:  Vital signs in last 24 hours:  Temp:  [97.6 F (36.4 C)-98.4 F (36.9 C)] 98.4 F (36.9 C) (03/16 0516) Pulse Rate:  [79-92] 80 (03/16 0516) Resp:  [14-16] 14 (03/16 0516) BP: (96-114)/(59-72) 96/60 (03/16 0516) SpO2:  [100 %] 100 % (03/16 0516) Weight:  [45.4 kg] 45.4 kg (03/15 1945)  Weight change: -1.4 kg Filed Weights   07/15/20 0434 07/15/20 0657 07/15/20 1945  Weight: 48.9 kg 43.7 kg 45.4 kg    Intake/Output: I/O last 3 completed shifts: In: 240 [P.O.:240] Out: -    Intake/Output this shift:  No intake/output data recorded.  Physical Exam General: Well appearing,nad Heart: RRR no m,r,g Lungs: Clear bilaterally  Abdomen: soft ntnd Extremities: No LE edema  Dialysis Access: PD cath in place dsg c,d,i   Basic Metabolic Panel: Recent Labs  Lab 07/12/20 0503  NA 127*  K 3.1*  CL 91*  CO2 24  GLUCOSE 89  BUN 15  CREATININE 4.41*  CALCIUM 7.7*  PHOS 3.3    Liver Function Tests: Recent Labs  Lab 07/12/20 0503  ALBUMIN 1.6*   No results for input(s): LIPASE, AMYLASE in the last 168 hours. No results for input(s): AMMONIA in the last  168 hours.  CBC: Recent Labs  Lab 07/14/20 0453  WBC 7.1  NEUTROABS 4.6  HGB 10.4*  HCT 30.6*  MCV 95.9  PLT 252    Cardiac Enzymes: No results for input(s): CKTOTAL, CKMB, CKMBINDEX, TROPONINI in the last 168 hours.  BNP: Invalid input(s): POCBNP  CBG: No results for input(s): GLUCAP in the last 168 hours.  Microbiology: Results for orders placed or performed during the hospital encounter of 06/19/20  MRSA PCR Screening     Status: None   Collection Time: 06/21/20 12:34 AM   Specimen: Nasopharyngeal  Result Value Ref Range Status   MRSA by PCR NEGATIVE NEGATIVE Final    Comment:        The GeneXpert MRSA Assay (FDA approved for NASAL specimens only), is one component of a comprehensive MRSA colonization surveillance program. It is not intended to diagnose MRSA infection nor to guide or monitor treatment for MRSA infections. Performed at [facility], [place] [address], [place], [place] [address]     Coagulation Studies: No results for input(s): LABPROT, INR in the last 72 hours.  Urinalysis: No results for input(s): COLORURINE, LABSPEC, PHURINE, GLUCOSEU, HGBUR, BILIRUBINUR, KETONESUR, PROTEINUR, UROBILINOGEN, NITRITE, LEUKOCYTESUR in the last 72 hours.  Invalid input(s): APPERANCEUR    Imaging: No results found.   Medications:   . dialysis solution 1.5% low-MG/low-CA     . aspirin  81 mg Oral Daily  .  atorvastatin  80 mg Oral q1800  . Chlorhexidine Gluconate Cloth  6 each Topical Daily  . cinacalcet  30 mg Oral Q breakfast  . clopidogrel  75 mg Oral Daily  . [START ON 07/19/2020] darbepoetin (ARANESP) injection - NON-DIALYSIS  100 mcg Subcutaneous Q Sat-1800  . feeding supplement  1 Container Oral TID BM  . gentamicin cream  1 application Topical Daily  . levETIRAcetam  500 mg Oral BID  . metoprolol succinate  25 mg Oral Daily  . multivitamin  1 tablet Oral QHS  . pantoprazole  40 mg Oral Daily  . potassium chloride  10 mEq Oral TID  .  sodium chloride flush  3 mL Intravenous Q12H  . topiramate  25 mg Oral Daily  . torsemide  100 mg Oral BID   acetaminophen, aluminum hydroxide, bisacodyl, butalbital-acetaminophen-caffeine, diphenhydrAMINE, Gerhardt's butt cream, guaiFENesin-dextromethorphan, dianeal solution for CAPD/[name] with heparin, milk and molasses, polyethylene glycol, zolpidem  Assessment/ Plan:   OP KD:TOIZ Herricks followed by Dr. Candiss Norse. 4 exchanges overnight, 2000 cc and 1.5 hrs each.    Assessment/ Plan: 1. Multivessel CAD with reduced EF 25/30%: Eval by TCTS, is"poor CABG candidate". s/pPCI to LAD with DESon 2/25.gettingPlavix and ASA 81 mg,+ statin 2. Debility: 2/2 Multiple med issues mainly CAD>now on CIR 3. ESRD - Continue [name]. Stable.  Increase potassium to 3 times daily.   Will need daily renal panel 4. HTN/volume -BPin goal,on metoprolol 25 mg daily. When using 2.5% ptc/o feeling"dizzy" > sonow doing all 1.5% bags. Tolerating well. On torsemide for volume  5. Anemia -Hgb 9- 11 range. s/p 1unitPRBCon 2/20 and on 2/27. Continue Aranesp 100 MCG q Saturday. tsat 91%,ferritin 1131. No need for iron at this time. 6. Secondary hyperparathyroidism: Phos at goal, no binders.  On Sensipar 30/d,stoppedRocaltrol with elevatedcorrectedcalcium>10, last phos check was 3 7. Hyponatremia -Chronic  8. Nutrition: Albumin low 1.7. On regular diet with prot supp.  9. Seizures - do to electrolyte abnormality in setting of C diff. Now on Keppra per neuro. No breakthrough seizures since admission to CIR   10. C diff - finished ABX course on 2/27. 11. Disposition-CIR admit for rehab    LOS: [place] @TODAY @7 :03 AM

## 2020-07-16 NOTE — Progress Notes (Addendum)
Patient ID: Amy [name], female   DOB: 08/02/1950, 70 y.o.   MRN: [id number]  Met with pt to discuss team conference progress toward her goals and discharge still 3/23. Discussed family education with husband and have contacted him and left a message regarding setting up education prior to discharge. Await his return call. Pt has needed equipment and had home health via [facility]. Will work on discharge needs. Await return call from husband.  2:33 PM Spoke with husband to set up family education for Friday @ 9:00. He will have daughter come in also. Aware discharge date and team recommendations.

## 2020-07-16 NOTE — Progress Notes (Signed)
Occupational Therapy Session Note  Patient Details  Name: ELDEAN KLATT MRN: [id number] Date of Birth: 07-Apr-1951  Today's Date: 07/16/2020 OT Individual Time: 1300-1358 OT Individual Time Calculation (min): 11 min    Short Term Goals: Week 2:  OT Short Term Goal 1 (Week 2): patient will complete bathing/dressing tasks edge of bed with set up/CS OT Short Term Goal 2 (Week 2): patient will complete functional transfers CS with RW OT Short Term Goal 3 (Week 2): patient will complete basic reach and transport of items with RW CS with good carryover of strategies   Skilled Therapeutic Interventions/Progress Updates:    Pt greeted at time of session sitting up in chair agreeable to OT session but stating she was not feeling great today, noted to have emesis bag in hand. Mild underlying nausea throughout but no vomiting. Nursing aware of nausea and discussed ordering small TEDS as well. Sit > stand Supervision and walked to bathroom CGA, transfer to toilet same manner. Small BM, small smear on underwear as well. Changed brief and donned pants with CGA before walking to sink. Brief episode of dizziness, seated and checked BP which was 103/79 which is her normal. Dizziness subsided and participated in sink level hair washing for comfort and ADL task. Therapist assist with washing but pt performed blow drying and grooming tasks. Set up at sink to finish blow drying and grooming hair with alarm on, NT aware and call bell in reach for pt to call when finished.   Therapy Documentation Precautions:  Precautions Precautions: Fall Precaution Comments: generalized weakness, peritoneal diaylisis, seizures, monitor O2 during ambulation Restrictions Weight Bearing Restrictions: No     Therapy/Group: Individual Therapy  Viona Gilmore 07/16/2020, 7:27 AM

## 2020-07-16 NOTE — Patient Care Conference (Signed)
Inpatient RehabilitationTeam Conference and Plan of Care Update Date: 07/16/2020   Time: 11:39 AM    Patient Name: Amy [name]      Medical Record Number: [id number]  Date of Birth: 09-19-1950 Sex: Female         Room/Bed: 4W10C/4W10C-01 Payor Info: Payor: MEDICARE / Plan: MEDICARE PART A AND B / Product Type: *No Product type* /    Admit Date/Time:  07/04/2020  6:17 PM  Primary Diagnosis:  [facility] Problems: Principal Problem:   Debility Active Problems:   Hypoalbuminemia due to protein-calorie malnutrition (HCC)   Nausea and vomiting   Chronic combined systolic and diastolic CHF (congestive heart failure) (HCC)   Anemia of chronic disease   Seizures (HCC)   Chronic pain syndrome   Nausea   Prolonged Q-T interval on ECG   Chronic combined systolic and diastolic congestive heart failure [facility])   Gastroparesis    Expected Discharge Date: Expected Discharge Date: 07/23/20  Team Members Present: Physician leading conference: Dr. Delice Lesch Care Coodinator Present: Ovidio Kin, LCSW;Deborah Hervey Ard, RN, BSN, [place] Nurse Present: Isla Pence, RN PT Present: Magda Kiel, PT OT Present: Simonne Come, OT PPS Coordinator present : Gunnar Fusi, Novella Olive, PT     Current Status/Progress Goal Weekly Team Focus  Bowel/Bladder   pt continet B/B LBM 3/15  Pt will remain continent of B/B  q2 toileting prn meds for constipation   Swallow/Nutrition/ Hydration             ADL's   supervision/set up for adl in seated position.   CS for functional transfers with RW (she plans to sponge bathe at discharge)      CS and cues for safe reach and transport of items in kitchen environment  supervision to modified independent  adl/transfer training, general conditioning, family education   Mobility   S to CGA transfers, CGA gait x 120' RW, 4 steps min A with bilateral rails,  S overall, except CGA stairs  LE strength, balance, activity tolerance   Communication              Safety/Cognition/ Behavioral Observations            Pain   pt complains of HA  Pt will have pain <3 out of 10 and HA managed by fiorcet  assess pain qshift/ PRN   Skin   bilaterally bruising to arms and redness to buttocks, PD catheter to right abdomen  Pt will remain free of skin breakdown and infection  assess skin and PD catheter dx qshift, educate pt on prevention of skin breakdown     Discharge Planning:  Home with husband and daughter to assist-coming up with a plan for 24/7 supervision at discharge. Will need family education prior to DC home   Team Discussion: HA better; Topamax stopped per Md. Weight stable. N/V resolved. Continue K+ supplement. Progress limited by low BP, balance, endurance and fatigue issues. Limit fluid bolus as patient is at risk for fluid volume overload.   Patient on target to meet rehab goals: yes, currently close supervision for OT and CGA for safety and steps with min assist. Goals set for supervision overall and CGA for steps.   *See Care Plan and progress notes for long and short-term goals.   Revisions to Treatment Plan:   Teaching Needs: Transfers, toileting, medications, etc.   Current Barriers to Discharge: Decreased caregiver support and Home enviroment access/layout  Possible Resolutions to Barriers: Family education HH follow up services  recommended     Medical Summary Current Status: Functional and mobility deficits secondary to debility after multiple medical issues-CHF, hx of C diff colitis, recent R hip fx 12/21 with ORIF  Barriers to Discharge: Other (comments);Medical stability;Behavior;Decreased family/caregiver support  Barriers to Discharge Comments: +PD Possible Resolutions to [employer] Focus: Therapies, monitor for seizures, continue pain meds, follow labs - Hb, K+, follow weights   Continued Need for Acute Rehabilitation Level of Care: The patient requires Amy medical management by a physician with specialized  training in physical medicine and rehabilitation for the following reasons: Direction of a multidisciplinary physical rehabilitation program to maximize functional independence : Yes Medical management of patient stability for increased activity during participation in an intensive rehabilitation regime.: Yes Analysis of laboratory values and/or radiology reports with any subsequent need for medication adjustment and/or medical intervention. : Yes   I attest that I was present, lead the team conference, and concur with the assessment and plan of the team.   Dorien Chihuahua B 07/16/2020, 2:29 PM

## 2020-07-16 NOTE — Progress Notes (Addendum)
PROGRESS NOTE   Subjective/Complaints: Patient seen sitting up in bed this morning.  She states she slept well overnight.  She notes resolution of nausea with no issues stopping Reglan.  She has a friend visiting at bedside. He was seen by [facility] yesterday, notes reviewed, labs ordered.   Review of systems: Denies CP, shortness of breath, nausea, vomiting, diarrhea.   Objective:   No results found. Recent Labs    07/14/20 0453  WBC 7.1  HGB 10.4*  HCT 30.6*  PLT 252   Recent Labs    07/16/20 0610  NA 128*  K 3.5  CL 92*  CO2 23  GLUCOSE 80  BUN 17  CREATININE 4.14*  CALCIUM 7.8*    Intake/Output Summary (Last 24 hours) at 07/16/2020 0920 Last data filed at 07/16/2020 0700 Gross per 24 hour  Intake 360 ml  Output --  Net 360 ml        Physical Exam: Vital Signs Blood pressure 96/60, pulse 80, temperature 98.4 F (36.9 C), temperature source Oral, resp. rate 14, height 5\' 4"  (1.626 m), weight 45.4 kg, SpO2 100 %. Constitutional: No distress . Vital signs reviewed. HENT: Normocephalic.  Atraumatic. Eyes: EOMI. No discharge. Cardiovascular: No JVD.  RRR. Respiratory: Normal effort.  No stridor.  Bilateral clear to auscultation. GI: Non-distended.  BS +. Skin: Warm and dry.  Intact Scattered bruising Psych: Normal mood.  Normal behavior. Musc: No edema in extremities.  No tenderness in extremities. Neurologic: Alert Motor: 4-4+/5 throughout  Assessment/Plan: 1. Functional deficits which require 3+ hours per day of interdisciplinary therapy in a comprehensive inpatient rehab setting.  Physiatrist is providing close team supervision and 24 hour management of active medical problems listed below.  Physiatrist and rehab team continue to assess barriers to discharge/monitor patient progress toward functional and medical goals  Care Tool:  Bathing    Body parts bathed by patient: Right arm,Left  arm,Chest,Abdomen,Front perineal area,Right upper leg,Left upper leg,Face,Right lower leg,Left lower leg,Buttocks   Body parts bathed by helper: Buttocks     Bathing assist Assist Level: Contact Guard/Touching assist     Upper Body Dressing/Undressing Upper body dressing   What is the patient wearing?: Pull over shirt    Upper body assist Assist Level: Set up assist    Lower Body Dressing/Undressing Lower body dressing      What is the patient wearing?: Pants     Lower body assist Assist for lower body dressing: Supervision/Verbal cueing     Toileting Toileting    Toileting assist Assist for toileting: Contact Guard/Touching assist     Transfers Chair/bed transfer  Transfers assist     Chair/bed transfer assist level: Contact Guard/Touching assist     Locomotion Ambulation   Ambulation assist      Assist level: Contact Guard/Touching assist Assistive device: Walker-rolling Max distance: 20'   Walk 10 feet activity   Assist  Walk 10 feet activity did not occur: Safety/medical concerns  Assist level: Contact Guard/Touching assist Assistive device: Walker-rolling   Walk 50 feet activity   Assist Walk 50 feet with 2 turns activity did not occur: Safety/medical concerns  Assist level: Contact Guard/Touching assist Assistive device: Walker-rolling  Walk 150 feet activity   Assist Walk 150 feet activity did not occur: Safety/medical concerns         Walk 10 feet on uneven surface  activity   Assist Walk 10 feet on uneven surfaces activity did not occur: Safety/medical concerns         Wheelchair     Assist Will patient use wheelchair at discharge?: Yes Type of Wheelchair: Manual Wheelchair activity did not occur: Safety/medical concerns  Wheelchair assist level: Minimal Assistance - Patient > 75% Max wheelchair distance: 120'    Wheelchair 50 feet with 2 turns activity    Assist    Wheelchair 50 feet with 2 turns  activity did not occur: Safety/medical concerns   Assist Level: Minimal Assistance - Patient > 75%   Wheelchair 150 feet activity     Assist  Wheelchair 150 feet activity did not occur: Safety/medical concerns       Medical Problem List and Plan: 1.Functional and mobility deficitssecondary to debility after multiple medical issues-CHF, hx of C diff colitis, recent R hip fx 12/21 with ORIF  Continue CIR   Team conference today to discuss current and goals and coordination of care, home and environmental barriers, and discharge planning with nursing, case manager, and therapies. Please see conference note from today as well.  2. Antithrombotics: -DVT/anticoagulation:Mechanical:Antiembolism stockings, knee (TED hose) Bilateral lower extremities Sequential compression devices, below kneeBilateral lower extremities -antiplatelet therapy: ASA/plavix 3.Chronic daily frequent HA/Pain Management:tylenol for basic pain -fioricet for breakthrough headaches (on this PTA)  Controlled with meds on 3/16  Started topirmate 25mg  daily for headache prevention, DC'd on 3/17-no changes in Fioricet need.  4. Mood:fairly positive. Team to provide ego support as needed -antipsychotic agents: n/a 5. Neuropsych: This patientiscapable of making decisions on herown behalf. 6. Skin/Wound Care:Routine pressure relief measures. 7. Fluids/Electrolytes/Nutrition:Monitor I/Os.  8. ESRD: Continue daily PD, dialysis doing well at this point. 9. CAD s/p PCI: On ASA/Plavix, metoprolol and lipitor. Monitor for symptoms with increase in activity.  10. New onset seizures: On Keppra bid Off Wellbutrin. Monitor for anxiety symptoms.  No breakthrough seizures since admission to CIR-3/16 11. H/o A flutter: Monitor HR tid--continue metoprolol. 12. Anemia of chronic disease: Monitor with serial H/H--improved post transfusion.  --On  Aranesp weekly  Hemoglobin 10.4 on 3/14  Hemoccult negative   Continue to monitor 13. C diff colitis: Completed treatment with Dificid on 02/27 -off contact precautions 14. Chronic combined CHF: Takatsubo CM with EF--25-30% -- Monitor weights daily.  --On demadex bid with Kdur for supplement as well as metoprolol and statin. Filed Weights   07/15/20 0434 07/15/20 0657 07/15/20 1945  Weight: 48.9 kg 43.7 kg 45.4 kg   ?  Reliability on 3/15, no weights for today 15.  Nausea, recent C. difficile colitis, end-stage renal disease, gastroparesis  Suspect this is chronic given PD, electrolyte abnormalities, confirmed by daughter.  However, patient states this is new.  KUB ordered, showing oral contrast throughout the colon, otherwise relatively unremarkable  Continue Reglan 5 BID, repeat ECG showing persistent prolonged QTC, DCed Reglan given improvement  Continue PPI-history of ulcer  Improved 16.  Sleep disturbance   Used Ambien at home.    Improving overall 17.  Hypoalbuminemia  Supplement initiated 18.  History of prolonged QTC  See #15  Zofran DC'd--patient states ineffective anyway. 19.  Hypokalemia  Potassium 3.5 on 3/16  Supplements being adjusted by nephrology-appreciate recommendations.  LOS: 12 days A FACE TO FACE EVALUATION WAS PERFORMED  Adanna Zuckerman Lorie Phenix  07/16/2020, 9:20 AM

## 2020-07-16 NOTE — Progress Notes (Signed)
Physical Therapy Session Note  Patient Details  Name: Amy [name] MRN: [id number]  Date of Birth: 03-22-1951  Today's Date: 07/16/2020 PT Individual Time:Session1: 1000-1100; Session2:1459-1537 PT Individual Time Calculation (min): 60 min & 38 min   Short Term Goals: Week 2:  PT Short Term Goal 1 (Week 2): Patient will ambulate 120' consistently with CGA with RW. PT Short Term Goal 2 (Week 2): Patient will demonstrate standing dynamic balance with CGA using 1 hand support for functional activities. PT Short Term Goal 3 (Week 2): Patient will negotiate 4 steps with rail and CGA. PT Short Term Goal 4 (Week 2): Patient to tolerate standing activity for 10 minutes without seated rest.  Skilled Therapeutic Interventions/Progress Updates:    Session1: Patient in supine and reports not feeling well, requesting to stay in the room for therapies today.  Patient encouraged for mobilizing OOB at least.  Requesting to toilet.  Supine BP 107/67, sitting 107/71.  Performed supine to sit with S and elevated HOB.  Patient sit to stand with CGA and ambulated 12' to bathroom with RW and CGA.  Patient toileted and performed hygiene and clothing management with CGA including transfer.  Patient seated in w/c to doff/don pullover shirt with S and cues for correct hand placement, doffed underwear sit to stand CGA, and donned underwear and pants with CGA.  Patient sit to stand S to RW to ambulate 6' to sink to stand and brush teeth with 1 UE supported and close S.  Patient seated in w/c to rest, then ambulated in room with RW x 140' turning around and walking back and forth doorway to window with CGA.  Patient seated in recliner for LE pedal exerciser 2 x 2 minute bouts.  Showed her options to order one for home if desired.  Seated Therex including hip flexion, LAQ, ankle DF/PF, feet up on recliner hip abduction, SAQ w/ 3 sec hold and hip adductor sets all x 10 with 2# on R and 2.5# on L.  Patient assisted to sit on w/c  cushion in recliner to improve tolerance to sitting up in recliner and left with call bell in reach and OT in for next session.   [place]: Patient in supine and reports threw up during lunch today, feeling weak and feverish and low BP.  RN informed and reports waiting on medication to be made po instead of IV, and to come from pharmacy.  States difficulty getting THT skinny enough for pt from supply, but suggested ace wraps.  RN in to deliver medication and PT applied knee hi ace wraps (4" wraps x 2 to each leg) for BP management.  Patient reports feels good but needs to have BM.  BP measured as noted and pt S for supine to sit and sit to stand to RW CGA and ambulated to bathroom CGA with RW.  Toileted with CGA for transfers.  Patient ambulated 10' to wash hands at sink with S and then to bed S.  Sit to supine S and removed ace wraps.  Left with needs and call bell in reach, spouse in the room and bed alarm active.  Orthostatic VS for the past 24 hrs (Last 3 readings):  BP- Lying Pulse- Lying BP- Standing at 0 minutes Pulse- Standing at 0 minutes  07/16/20 1600 110/65 90 93/66 101    Therapy Documentation Precautions:  Precautions Precautions: Fall Precaution Comments: generalized weakness, peritoneal diaylisis, seizures, monitor O2 during ambulation Restrictions Weight Bearing Restrictions: No Pain: Pain Assessment Pain  Scale: 0-10 Pain Score: 7  Pain Type: Chronic pain Pain Location: Head Pain Orientation: Other (Comment) (all over) Pain Descriptors / Indicators: Headache Pain Frequency: Intermittent Pain Onset: On-going Pain Intervention(s): Medication (See eMAR)   Therapy/Group: Individual Therapy  Reginia Naas  [place], PT 07/16/2020, 10:15 AM

## 2020-07-16 NOTE — Progress Notes (Signed)
Occupational Therapy Session Note  Patient Details  Name: Amy [name] MRN: [id number] Date of Birth: [birth year]  Today's Date: 07/16/2020 OT Individual Time: 1106-1130 OT Individual Time Calculation (min): 24 min    Short Term Goals: Week 2:  OT Short Term Goal 1 (Week 2): patient will complete bathing/dressing tasks edge of bed with set up/CS OT Short Term Goal 2 (Week 2): patient will complete functional transfers CS with RW OT Short Term Goal 3 (Week 2): patient will complete basic reach and transport of items with RW CS with good carryover of strategies  Skilled Therapeutic Interventions/Progress Updates:    Treatment session with focus on activity tolerance and energy conservation.  Pt received upright in recliner agreeable to therapy session, but requesting to remain in room.  Pt reports having symptomatic low BP yesterday but feeling better today.  Engaged in sit > stand to engage in table top task in standing.  Pt required CGA for sit > stand and intermittent CGA to close supervision when standing.  Pt tolerated standing 6 mins during table top peg pattern task.  Therapist providing min cues for sequencing and problem solving.  Educated on energy conservation strategies reiterating handout from primary OT and providing recommendations during familiar, functional tasks.  Pt remained seated in recliner with legs elevated and all needs in reach.  Therapy Documentation Precautions:  Precautions Precautions: Fall Precaution Comments: generalized weakness, peritoneal diaylisis, seizures, monitor O2 during ambulation Restrictions Weight Bearing Restrictions: No General:   Vital Signs: Therapy Vitals Temp: 98.4 F (36.9 C) Temp Source: Oral Resp: 14 BP: 103/67 Oxygen Therapy SpO2: 99 % O2 Device: Room Air Pain:  Pt with reports of mild pain in lower back, premedicated.   Therapy/Group: Individual Therapy  Simonne Come 07/16/2020, 12:14 PM

## 2020-07-17 LAB — RENAL FUNCTION PANEL
Albumin: 1.5 g/dL — ABNORMAL LOW (ref 3.5–5.0)
Anion gap: 13 (ref 5–15)
BUN: 18 mg/dL (ref 8–23)
CO2: 22 mmol/L (ref 22–32)
Calcium: 7.8 mg/dL — ABNORMAL LOW (ref 8.9–10.3)
Chloride: 92 mmol/L — ABNORMAL LOW (ref 98–111)
Creatinine, Ser: 4.14 mg/dL — ABNORMAL HIGH (ref 0.44–1.00)
GFR, Estimated: 11 mL/min — ABNORMAL LOW (ref >60)
Glucose, Bld: 79 mg/dL (ref 70–99)
Phosphorus: 3.7 mg/dL (ref 2.5–4.6)
Potassium: 3.8 mmol/L (ref 3.5–5.1)
Sodium: 127 mmol/L — ABNORMAL LOW (ref 135–145)

## 2020-07-17 NOTE — Progress Notes (Signed)
Rec'd small thigh high teds

## 2020-07-17 NOTE — Progress Notes (Signed)
PROGRESS NOTE   Subjective/Complaints: No complaints this morning. Feels headaches improved with Topamax Na down to 127 Cr 4.14  Review of systems: Denies CP, shortness of breath, nausea, vomiting, diarrhea, headaches better controlled  Objective:   No results found. No results for input(s): WBC, HGB, HCT, PLT in the last 72 hours. Recent Labs    07/16/20 0610 07/17/20 0520  NA 128* 127*  K 3.5 3.8  CL 92* 92*  CO2 23 22  GLUCOSE 80 79  BUN 17 18  CREATININE 4.14* 4.14*  CALCIUM 7.8* 7.8*    Intake/Output Summary (Last 24 hours) at 07/17/2020 1335 Last data filed at 07/16/2020 1805 Gross per 24 hour  Intake 120 ml  Output --  Net 120 ml        Physical Exam: Vital Signs Blood pressure (!) 93/58, pulse 83, temperature 98 F (36.7 C), resp. rate 16, height 5\' 4"  (1.626 m), weight 43.3 kg, SpO2 100 %. Gen: no distress, normal appearing HEENT: oral mucosa pink and moist, NCAT Cardio: Reg rate Chest: normal effort, normal rate of breathing Abd: soft, non-distended Ext: no edema Psych: pleasant, normal affect Skin: intact Scattered bruising. Psych: Normal mood.  Normal behavior. Musc: No edema in extremities.  No tenderness in extremities. Neurologic: Alert Motor: 4/5 throughout, unchanged  Assessment/Plan: 1. Functional deficits which require 3+ hours per day of interdisciplinary therapy in a comprehensive inpatient rehab setting.  Physiatrist is providing close team supervision and 24 hour management of active medical problems listed below.  Physiatrist and rehab team continue to assess barriers to discharge/monitor patient progress toward functional and medical goals  Care Tool:  Bathing    Body parts bathed by patient: Right arm,Left arm,Chest,Abdomen,Front perineal area,Right upper leg,Left upper leg,Face,Right lower leg,Left lower leg,Buttocks   Body parts bathed by helper: Buttocks      Bathing assist Assist Level: Contact Guard/Touching assist     Upper Body Dressing/Undressing Upper body dressing   What is the patient wearing?: Pull over shirt    Upper body assist Assist Level: Set up assist    Lower Body Dressing/Undressing Lower body dressing      What is the patient wearing?: Underwear/pull up,Pants     Lower body assist Assist for lower body dressing: Supervision/Verbal cueing     Toileting Toileting    Toileting assist Assist for toileting: Supervision/Verbal cueing     Transfers Chair/bed transfer  Transfers assist     Chair/bed transfer assist level: Contact Guard/Touching assist     Locomotion Ambulation   Ambulation assist      Assist level: Contact Guard/Touching assist Assistive device: Walker-rolling Max distance: 140'   Walk 10 feet activity   Assist  Walk 10 feet activity did not occur: Safety/medical concerns  Assist level: Contact Guard/Touching assist Assistive device: Walker-rolling   Walk 50 feet activity   Assist Walk 50 feet with 2 turns activity did not occur: Safety/medical concerns  Assist level: Contact Guard/Touching assist Assistive device: Walker-rolling    Walk 150 feet activity   Assist Walk 150 feet activity did not occur: Safety/medical concerns         Walk 10 feet on uneven surface  activity  Assist Walk 10 feet on uneven surfaces activity did not occur: Safety/medical concerns         Wheelchair     Assist Will patient use wheelchair at discharge?: Yes Type of Wheelchair: Manual Wheelchair activity did not occur: Safety/medical concerns  Wheelchair assist level: Minimal Assistance - Patient > 75% Max wheelchair distance: 120'    Wheelchair 50 feet with 2 turns activity    Assist    Wheelchair 50 feet with 2 turns activity did not occur: Safety/medical concerns   Assist Level: Minimal Assistance - Patient > 75%   Wheelchair 150 feet activity      Assist  Wheelchair 150 feet activity did not occur: Safety/medical concerns       Medical Problem List and Plan: 1.Functional and mobility deficitssecondary to debility after multiple medical issues-CHF, hx of C diff colitis, recent R hip fx 12/21 with ORIF  Continue CIR  2. Antithrombotics: -DVT/anticoagulation:Mechanical:Antiembolism stockings, knee (TED hose) Bilateral lower extremities Sequential compression devices, below kneeBilateral lower extremities -antiplatelet therapy: ASA/plavix 3.Chronic daily frequent HA/Pain Management:tylenol for basic pain -fioricet for breakthrough headaches (on this PTA)  Controlled with meds on 3/15  Continue topirmate 25mg  daily for headache prevention.  4. Mood:fairly positive. Team to provide ego support as needed -antipsychotic agents: n/a 5. Neuropsych: This patientiscapable of making decisions on herown behalf. 6. Skin/Wound Care:Routine pressure relief measures. 7. Fluids/Electrolytes/Nutrition:Monitor I/Os.  8. ESRD: Continue daily PD, dialysis doing well at this point. 9. CAD s/p PCI: On ASA/Plavix, metoprolol and lipitor. Monitor for symptoms with increase in activity.  10. New onset seizures: Continue Keppra bid Off Wellbutrin. Monitor for anxiety symptoms.  No breakthrough seizures since admission to CIR-3/17 11. H/o A flutter: Monitor HR tid--continue metoprolol. 12. Anemia of chronic disease: Monitor with serial H/H--improved post transfusion.  --On Aranesp weekly  Hemoglobin 10.4 on 3/14  Hemoccult negative   Continue to monitor 13. C diff colitis: Completed treatment with Dificid on 02/27 -off contact precautions 14. Chronic combined CHF: Takatsubo CM with EF--25-30% -- Monitor weights daily.  --On demadex bid with Kdur for supplement as well as metoprolol and statin. Filed Weights    07/15/20 0657 07/15/20 1945 07/17/20 0825  Weight: 43.7 kg 45.4 kg 43.3 kg   3/17 decreasing 15.  Nausea, recent C. difficile colitis, end-stage renal disease, gastroparesis  Suspect this is chronic given PD, electrolyte abnormalities, confirmed by daughter.  However, patient states this is new.  KUB ordered, showing oral contrast throughout the colon, otherwise relatively unremarkable  Continue Reglan 5 BID, repeat ECG showing persistent prolonged QTC, DC Reglan given improvement  Continue PPI-history of ulcer 16.  Sleep disturbance   Used Ambien at home.    Improving overall 17.  Hypoalbuminemia  Supplement initiated 18.  History of prolonged QTC  See #15  Zofran DC'd--patient states ineffective anyway. 19.  Hypokalemia  Potassium 3.1 on 3/14  Supplements being adjusted by nephrology-appreciate recommendations.  LOS: 13 days A FACE TO FACE EVALUATION WAS PERFORMED  Martha Clan P Akemi Overholser 07/17/2020, 1:35 PM

## 2020-07-17 NOTE — Progress Notes (Signed)
[facility] ROUNDING NOTE   Subjective:   Brief history: 70 year old lady end-stage renal disease multivessel CAD with reduced ejection fraction 25-30%.  Status post PCI to LAD with DES stent placed 06/27/2020.  She continues on [name].  Appears to be tolerating this well.  She continues on 1.5% bags with the addition of torsemide for additional volume control.  Blood pressure 103/73 pulse 84 temperature 98.3 O2 sats 90% room air  Sodium 127 potassium 3.8 chloride 92 CO2 22 BUN 18 creatinine 4.14 glucose 79 calcium 7.8 phosphorus 3.7 albumin 1.5 hemoglobin 10.4  Aspirin 81 mg daily, Lipitor 80 mg daily Sensipar 30 mg day Plavix 75 mg daily, Keppra 500 mg twice daily metoprolol 5 mg daily multivitamins 1 daily Protonix 40 mg daily potassium chloride 10 mEq twice daily, Demadex 100 mg twice daily   Objective:  Vital signs in last 24 hours:  Temp:  [98.2 F (36.8 C)-98.4 F (36.9 C)] 98.3 F (36.8 C) (03/16 1946) Pulse Rate:  [82-96] 82 (03/16 1946) Resp:  [14-16] 14 (03/16 1946) BP: (90-109)/(58-77) 109/73 (03/16 1946) SpO2:  [99 %-100 %] 100 % (03/16 1946)  Weight change:  Filed Weights   07/15/20 0434 07/15/20 0657 07/15/20 1945  Weight: 48.9 kg 43.7 kg 45.4 kg    Intake/Output: I/O last 3 completed shifts: In: 600 [P.O.:600] Out: -    Intake/Output this shift:  No intake/output data recorded.  Physical Exam General: Well appearing,nad Heart: RRR no m,r,g Lungs: Clear bilaterally  Abdomen: soft ntnd Extremities: No LE edema  Dialysis Access: PD cath in place dsg c,d,i   Basic Metabolic Panel: Recent Labs  Lab 07/12/20 0503 07/16/20 0610 07/17/20 0520  NA 127* 128* 127*  K 3.1* 3.5 3.8  CL 91* 92* 92*  CO2 24 23 22   GLUCOSE 89 80 79  BUN 15 17 18   CREATININE 4.41* 4.14* 4.14*  CALCIUM 7.7* 7.8* 7.8*  PHOS 3.3 3.4 3.7    Liver Function Tests: Recent Labs  Lab 07/12/20 0503 07/16/20 0610 07/17/20 0520  ALBUMIN 1.6* 1.6* 1.5*   No  results for input(s): LIPASE, AMYLASE in the last 168 hours. No results for input(s): AMMONIA in the last 168 hours.  CBC: Recent Labs  Lab 07/14/20 0453  WBC 7.1  NEUTROABS 4.6  HGB 10.4*  HCT 30.6*  MCV 95.9  PLT 252    Cardiac Enzymes: No results for input(s): CKTOTAL, CKMB, CKMBINDEX, TROPONINI in the last 168 hours.  BNP: Invalid input(s): POCBNP  CBG: No results for input(s): GLUCAP in the last 168 hours.  Microbiology: Results for orders placed or performed during the hospital encounter of 06/19/20  MRSA PCR Screening     Status: None   Collection Time: 06/21/20 12:34 AM   Specimen: Nasopharyngeal  Result Value Ref Range Status   MRSA by PCR NEGATIVE NEGATIVE Final    Comment:        The GeneXpert MRSA Assay (FDA approved for NASAL specimens only), is one component of a comprehensive MRSA colonization surveillance program. It is not intended to diagnose MRSA infection nor to guide or monitor treatment for MRSA infections. Performed at [facility], [place] [address] [place], [place] [address]     Coagulation Studies: No results for input(s): LABPROT, INR in the last 72 hours.  Urinalysis: No results for input(s): COLORURINE, LABSPEC, PHURINE, GLUCOSEU, HGBUR, BILIRUBINUR, KETONESUR, PROTEINUR, UROBILINOGEN, NITRITE, LEUKOCYTESUR in the last 72 hours.  Invalid input(s): APPERANCEUR    Imaging: No results found.  Medications:   . dialysis solution 1.5% low-MG/low-CA     . aspirin  81 mg Oral Daily  . atorvastatin  80 mg Oral q1800  . Chlorhexidine Gluconate Cloth  6 each Topical Daily  . cinacalcet  30 mg Oral Q breakfast  . clopidogrel  75 mg Oral Daily  . [START ON 07/19/2020] darbepoetin (ARANESP) injection - NON-DIALYSIS  100 mcg Subcutaneous Q Sat-1800  . feeding supplement  1 Container Oral TID BM  . gentamicin cream  1 application Topical Daily  . levETIRAcetam  500 mg Oral BID  . metoprolol succinate  25 mg Oral Daily  .  multivitamin  1 tablet Oral QHS  . pantoprazole  40 mg Oral Daily  . potassium chloride  10 mEq Oral TID  . torsemide  100 mg Oral BID   acetaminophen, aluminum hydroxide, bisacodyl, butalbital-acetaminophen-caffeine, diphenhydrAMINE, Gerhardt's butt cream, guaiFENesin-dextromethorphan, dianeal solution for CAPD/[name] with heparin, milk and molasses, polyethylene glycol, prochlorperazine, zolpidem  Assessment/ Plan:   OP DB:ZMCE [place] followed by Dr. Candiss Norse. 4 exchanges overnight, 2000 cc and 1.5 hrs each.    Assessment/ Plan: 1. Multivessel CAD with reduced EF 25/30%: Eval by TCTS, is"poor CABG candidate". s/pPCI to LAD with DESon 2/25.gettingPlavix and ASA 81 mg,+ statin 2. Debility: 2/2 Multiple med issues mainly CAD>now on CIR 3. ESRD - Continue [name]. Stable.  Increased potassium to 3 times daily.   Will need daily renal panel 4. HTN/volume -BPin goal,on metoprolol 25 mg daily. When using 2.5% ptc/o feeling"dizzy" > sonow doing all 1.5% bags. Tolerating well. On torsemide for volume  5. Anemia -Hgb 9- 11 range. s/p 1unitPRBCon 2/20 and on 2/27. Continue Aranesp 100 MCG q Saturday. tsat 91%,ferritin 1131. No need for iron at this time. 6. Secondary hyperparathyroidism: Phos at goal, no binders.  On Sensipar 30/d,stoppedRocaltrol with elevatedcorrectedcalcium>10, last phos check was 3 7. Hyponatremia -Chronic  8. Nutrition: Albumin low 1.7. On regular diet with prot supp.  9. Seizures - do to electrolyte abnormality in setting of C diff. Now on Keppra per neuro. No breakthrough seizures since admission to CIR   10. C diff - finished ABX course on 2/27. 11. Disposition-CIR admit for rehab    LOS: [place] @TODAY @6 :55 AM

## 2020-07-17 NOTE — Progress Notes (Signed)
Occupational Therapy Session Note  Patient Details  Name: Amy [name] MRN: [id number] Date of Birth: Apr 10, 1951  Today's Date: 07/17/2020 OT Individual Time: 5102-5852 OT Individual Time Calculation (min): 45 min    Short Term Goals: Week 2:  OT Short Term Goal 1 (Week 2): patient will complete bathing/dressing tasks edge of bed with set up/CS OT Short Term Goal 2 (Week 2): patient will complete functional transfers CS with RW OT Short Term Goal 3 (Week 2): patient will complete basic reach and transport of items with RW CS with good carryover of strategies  Skilled Therapeutic Interventions/Progress Updates:    Patient in bed, alert and ready for therapy session.  She denies pain and states that nausea has improved.  Supine to sitting edge of bed with CS, sit to stand and ambulation with RW to/from bed, toilet, w/c with CS/CGA.  toileting completed with CS/CGA, lower body dressing completed w/c level with CS, CGA for clothing management in stance, max A to donn thigh high teds, CS to donn slippers, set up for [facility] shirt and grooming tasks seated at sink.  Utilized shampoo tray to wash hair this session, she is able to use hair dryer and products to style hair with set up.  Patient returned to bed at close of session with CS.  No dizziness or complaints of fatigue.  Bed alarm set and call bell in reach.    Therapy Documentation Precautions:  Precautions Precautions: Fall Precaution Comments: generalized weakness, peritoneal diaylisis, seizures, monitor O2 during ambulation Restrictions Weight Bearing Restrictions: No  Therapy/Group: Individual Therapy  Carlos Levering 07/17/2020, 8:59 AM

## 2020-07-17 NOTE — Progress Notes (Signed)
Physical Therapy Session Note  Patient Details  Name: Amy [name] MRN: [id number] Date of Birth: 1950-10-09  Today's Date: 07/17/2020 PT Individual Time: 5035-4656 PT Individual Time Calculation (min): 26 min   Short Term Goals: Week 2:  PT Short Term Goal 1 (Week 2): Patient will ambulate 120' consistently with CGA with RW. PT Short Term Goal 2 (Week 2): Patient will demonstrate standing dynamic balance with CGA using 1 hand support for functional activities. PT Short Term Goal 3 (Week 2): Patient will negotiate 4 steps with rail and CGA. PT Short Term Goal 4 (Week 2): Patient to tolerate standing activity for 10 minutes without seated rest.  Skilled Therapeutic Interventions/Progress Updates:     Pt received seated in [facility] and agrees to therapy. No apparent pain during session, though does report generalized fatigue. WC transport to gym for time management. Pt ambulates x140' with RW and minA, with consistent cues to increase upright gaze to improve posture and balance, increase proximity to RW for safety, and increase gait speed to decrease risk for falls. Following extended seated rest break, pt ambulates additional 170' with minA and RW. Pt left seated in WC with all needs within reach.  Therapy Documentation Precautions:  Precautions Precautions: Fall Precaution Comments: generalized weakness, peritoneal diaylisis, seizures, monitor O2 during ambulation Restrictions Weight Bearing Restrictions: No   Therapy/Group: Individual Therapy  Breck Coons, PT, DPT 07/17/2020, 4:07 PM

## 2020-07-17 NOTE — Progress Notes (Signed)
Occupational Therapy Session Note  Patient Details  Name: Amy [name] MRN: [id number] Date of Birth: 10-07-1950  Today's Date: 07/17/2020 OT Individual Time: 1304-1405 OT Individual Time Calculation (min): 61 min    Short Term Goals: Week 2:  OT Short Term Goal 1 (Week 2): patient will complete bathing/dressing tasks edge of bed with set up/CS OT Short Term Goal 2 (Week 2): patient will complete functional transfers CS with RW OT Short Term Goal 3 (Week 2): patient will complete basic reach and transport of items with RW CS with good carryover of strategies  Skilled Therapeutic Interventions/Progress Updates:    Pt in bed to start session, agreeable to therapy with transfer to the EOB with supervision. BP was taken in sitting at 99/75.   She was then able to complete donning her slip on slippers with setup and transfer to the wheelchair at min assist using the RW for support.  Had her propel her wheelchair down to the dayroom with supervision for work on standing balance with use of the [employer].  She was able to stand with min guard assist while tossing bean bags with the RUE.  She needed min questioning cueing to keep up with her point total as well as min assist for picking up the bean bags with her RW in front for support.  Also utilized reacher for picking them up as well.  Next, had her complet toilet transfer with min assist to the 3:1 with slight bowel incontinence noted in clothing.  Pt with loose stool which she reports has been happening lately.  Nursing made aware of situation with medication requested.  She was able to complete toilet hygiene, change underpants and pants, and donn her shoes with overall min assist.  Finished session with pt completing hand hygiene in sitting at the sink.  She was left with the call button and phone in reach with pt awaiting next session with PT.    Therapy Documentation Precautions:  Precautions Precautions: Fall Precaution Comments:  generalized weakness, peritoneal diaylisis, seizures, monitor O2 during ambulation Restrictions Weight Bearing Restrictions: No  Pain: Pain Assessment Pain Scale: Faces Pain Score: 7  Faces Pain Scale: Hurts a little bit Pain Type: Chronic pain Pain Location: Head Pain Orientation: Posterior Pain Descriptors / Indicators: Discomfort Pain Frequency: Constant Pain Onset: With Activity Pain Intervention(s): Emotional support ADL: See Care Tool Section for some details of mobility and selfcare  Therapy/Group: Individual Therapy  MCGUIRE,JAMES OTR/L 07/17/2020, 3:12 PM

## 2020-07-17 NOTE — Progress Notes (Signed)
Physical Therapy Session Note  Patient Details  Name: Amy [name] MRN: [id number] Date of Birth: 08-17-1950  Today's Date: 07/17/2020 PT Individual Time: 1761-6073 PT Individual Time Calculation (min): 74 min   Short Term Goals: Week 2:  PT Short Term Goal 1 (Week 2): Patient will ambulate 120' consistently with CGA with RW. PT Short Term Goal 2 (Week 2): Patient will demonstrate standing dynamic balance with CGA using 1 hand support for functional activities. PT Short Term Goal 3 (Week 2): Patient will negotiate 4 steps with rail and CGA. PT Short Term Goal 4 (Week 2): Patient to tolerate standing activity for 10 minutes without seated rest.  Skilled Therapeutic Interventions/Progress Updates:    Patient in w/c and reports just got back from another therapy about 20 minutes ago.  States having some bouts of loose stool today.  Patient assisted in w/c to therapy gym and initiated stair training on steps with bilateral rails with CGA up first step, then pt c/o feeling some bowel incontinence.  Assisted in w/c to room and CGA to stand and walk to bathroom with RW.  Patient completed toileting and clothing management with CGA.  Patient assisted to therapy gym in w/c for time management.  Performed single step with RW and CGA with cues for technique.  Patient ambulated 150' to ortho gym with RW and close [place] with wheelchair close for safety.  Performed car transfer to simulated sedan height with CGA with RW.  Performed standing balance activity tapping targets on BITS with single UE support, first for memory able to recall up to 4 items with difficulty recalling 5 items, then tapping dots, then letters of alphabet with A for recalling next letter for 5 letters later in alphabet.  Patient ambulated x 150' with RW and close S/CGA.  Assisted to room in w/c and toileted again with CGA, seated on bed to doff shirt and don gown with set up and sit to stand to doff pants with min A.  Patient to supine with S  and left with dialysis RN to set up PD in the room.   Therapy Documentation Precautions:  Precautions Precautions: Fall Precaution Comments: generalized weakness, peritoneal diaylisis, seizures, monitor O2 during ambulation Restrictions Weight Bearing Restrictions: No Pain: Pain Assessment Pain Scale: 0-10 Pain Score: 0-No pain Faces Pain Scale: Hurts a little bit Pain Type: Chronic pain Pain Location: Head Pain Orientation: Posterior Pain Descriptors / Indicators: Discomfort Pain Onset: With Activity Pain Intervention(s): Emotional support   Therapy/Group: Individual Therapy  Reginia Naas  [place], [place] 07/17/2020, 5:27 PM

## 2020-07-17 NOTE — Progress Notes (Signed)
Nutrition Follow-up  DOCUMENTATION CODES:   Severe malnutrition in context of chronic illness  INTERVENTION:   - ContinueBoost Breeze po TID, each supplement provides 250 kcal and 9 grams of protein(wild berry flavor)  -Ice cream TID with meals per pt request  - Chocolate milk TID with meals per pt request  - Continue renal MVI daily  - Encourage adequate PO intake  NUTRITION DIAGNOSIS:   Severe Malnutrition related to chronic illness (ESRD, CHF) as evidenced by severe fat depletion,severe muscle depletion.  Ongoing  GOAL:   Patient will meet greater than or equal to 90% of their needs  Unmet  MONITOR:   PO intake,Supplement acceptance,Labs,Weight trends,I & O's  REASON FOR ASSESSMENT:   Other (history of malnutrition)    ASSESSMENT:   70 year old female with PMH of ESRD on PD, HTN, gastritis, anxiety, right hip fracture s/p ORIF 04/2020, recent admission for diarrhea (found to be COVID+ also) due to C diff colitis. Pt was was readmitted on 06/19/20 with mental status changes due to brief seizure from multiple metabolic derangements related to volume depletion. Pt found to have Takotsubo cardiomyopathy and combined CHF. Pt underwent cardiac cath revealing severe multivessel disease and underwent PCI 2/25 as not felt to be CABG candidate. Admitted to CIR on 3/04.  PD ongoing with 4 exchanges of 1.5% overnight, 2000 ml and 1.5 hours each.  CIR admit weight: 49.8 kg Current weight: 43.3 kg  Pt with a total weight loss of 6.5 kg since admission to CIR. This is a 13.1% weight loss in less than 1 month which is severe and significant for timeframe.  Spoke with pt at bedside. She continues to report nausea and poor PO intake. She will occasionally consume the Boost Breeze or chocolate milk but not consistently. Discussed pt's malnutrition and poor PO intake with PA. Pt aware that family can bring in whatever food she wants.  Meal Completion: 25-80% x last 8  documented meals  Medications reviewed and include: sensipar, aranesp, Boost Breeze TID, rena-vit, protonix, klor-con 10 mEq TID, torsemide  Labs reviewed: sodium 127  Diet Order:   Diet Order            Diet regular Room service appropriate? Yes; Fluid consistency: Thin  Diet effective now                 EDUCATION NEEDS:   Education needs have been addressed  Skin:  Skin Assessment: Reviewed RN Assessment  Last BM:  07/16/20 small type 6  Height:   Ht Readings from Last 1 Encounters:  07/05/20 5\' 4"  (1.626 m)    Weight:   Wt Readings from Last 1 Encounters:  07/17/20 43.3 kg    BMI:  Body mass index is 16.39 kg/m.  Estimated Nutritional Needs:   Kcal:  1700-1900  Protein:  85-100 grams  Fluid:  1000 ml + UOP    Gustavus Bryant, MS, RD, LDN Inpatient Clinical Dietitian Please see AMiON for contact information.

## 2020-07-17 NOTE — Progress Notes (Signed)
Pt completed PD tx this AM. Net UF 944mls.

## 2020-07-18 LAB — CBC
HCT: 28.5 % — ABNORMAL LOW (ref 36.0–46.0)
Hemoglobin: 9.7 g/dL — ABNORMAL LOW (ref 12.0–15.0)
MCH: 32.9 pg (ref 26.0–34.0)
MCHC: 34 g/dL (ref 30.0–36.0)
MCV: 96.6 fL (ref 80.0–100.0)
Platelets: 215 10*3/uL (ref 150–400)
RBC: 2.95 MIL/uL — ABNORMAL LOW (ref 3.87–5.11)
RDW: 21.6 % — ABNORMAL HIGH (ref 11.5–15.5)
WBC: 6.5 10*3/uL (ref 4.0–10.5)
nRBC: 0 % (ref 0.0–0.2)

## 2020-07-18 LAB — RENAL FUNCTION PANEL
Albumin: 1.5 g/dL — ABNORMAL LOW (ref 3.5–5.0)
Anion gap: 10 (ref 5–15)
BUN: 18 mg/dL (ref 8–23)
CO2: 24 mmol/L (ref 22–32)
Calcium: 7.5 mg/dL — ABNORMAL LOW (ref 8.9–10.3)
Chloride: 93 mmol/L — ABNORMAL LOW (ref 98–111)
Creatinine, Ser: 4.13 mg/dL — ABNORMAL HIGH (ref 0.44–1.00)
GFR, Estimated: 11 mL/min — ABNORMAL LOW (ref >60)
Glucose, Bld: 86 mg/dL (ref 70–99)
Phosphorus: 3.6 mg/dL (ref 2.5–4.6)
Potassium: 3.7 mmol/L (ref 3.5–5.1)
Sodium: 127 mmol/L — ABNORMAL LOW (ref 135–145)

## 2020-07-18 MED ORDER — HEPARIN 1000 UNIT/ML FOR PERITONEAL DIALYSIS: 500.0000 [IU] | INTRAMUSCULAR | Status: DC | PRN

## 2020-07-18 MED ORDER — HEPARIN 1000 UNIT/ML FOR PERITONEAL DIALYSIS
INTRAPERITONEAL | Status: DC | PRN
  Filled 2020-07-18: qty 5000

## 2020-07-18 MED ORDER — DELFLEX-LC/1.5% DEXTROSE 344 MOSM/L IP SOLN: INTRAPERITONEAL | Status: DC

## 2020-07-18 MED ORDER — GENTAMICIN SULFATE 0.1 % EX CREA: 1.0000 "application " | TOPICAL_CREAM | Freq: Every day | CUTANEOUS | Status: DC

## 2020-07-18 MED ORDER — MELATONIN 3 MG PO TABS
3.0000 mg | ORAL_TABLET | Freq: Every day | ORAL | Status: DC
  Administered 2020-07-18 – 2020-07-23 (×6): 3 mg via ORAL
  Filled 2020-07-18 (×6): qty 1

## 2020-07-18 NOTE — Progress Notes (Signed)
[facility] Progress Note   Subjective:   Seen in room, working with OT. Reports PD is going well. No abdominal pain, nausea or vomiting. Denies SOB, CP, palpitations, and edema.   Objective Vitals:   07/17/20 1610 07/17/20 1959 07/18/20 0414 07/18/20 0615  BP: 106/76 108/68 104/68 105/71  Pulse: 89 88 78 86  Resp: 18 18 18 16   Temp: (!) 97.5 F (36.4 C) 98.3 F (36.8 C) 98.2 F (36.8 C) 98.3 F (36.8 C)  TempSrc: Oral   Oral  SpO2: 100% 99% 99% 100%  Weight: 43.9 kg  43.1 kg 43.1 kg  Height:       Physical Exam General: Well developed female, alert and in NAD Heart: RRR, no murmurs, rubs or gallops Lungs: CTA bilaterally without wheezing, rhonchi or rales Abdomen: Soft, non-tender, non-distended, +BS Extremities:No edema b/l lower extremities Dialysis Access:  PD cath in RLQ, no TTP  Additional Objective Labs: Basic Metabolic Panel: Recent Labs  Lab 07/16/20 0610 07/17/20 0520 07/18/20 0455  NA 128* 127* 127*  K 3.5 3.8 3.7  CL 92* 92* 93*  CO2 23 22 24   GLUCOSE 80 79 86  BUN 17 18 18   CREATININE 4.14* 4.14* 4.13*  CALCIUM 7.8* 7.8* 7.5*  PHOS 3.4 3.7 3.6   Liver Function Tests: Recent Labs  Lab 07/16/20 0610 07/17/20 0520 07/18/20 0455  ALBUMIN 1.6* 1.5* 1.5*   CBC: Recent Labs  Lab 07/14/20 0453 07/18/20 0455  WBC 7.1 6.5  NEUTROABS 4.6  --   HGB 10.4* 9.7*  HCT 30.6* 28.5*  MCV 95.9 96.6  PLT 252 215   Blood Culture    Component Value Date/Time   SDES BLOOD BLOOD RIGHT HAND 05/29/2020 2043   SPECREQUEST  05/29/2020 2043    BOTTLES DRAWN AEROBIC AND ANAEROBIC Blood Culture adequate volume   CULT  05/29/2020 2043    NO GROWTH 5 DAYS Performed at [facility], [address] [place], [place] [address]    REPTSTATUS 06/03/2020 FINAL 05/29/2020 2043    Medications: . dialysis solution 1.5% low-MG/low-CA     . aspirin  81 mg Oral Daily  . atorvastatin  80 mg Oral q1800  . Chlorhexidine Gluconate Cloth  6  each Topical Daily  . cinacalcet  30 mg Oral Q breakfast  . clopidogrel  75 mg Oral Daily  . [START ON 07/19/2020] darbepoetin (ARANESP) injection - NON-DIALYSIS  100 mcg Subcutaneous Q Sat-1800  . feeding supplement  1 Container Oral TID BM  . gentamicin cream  1 application Topical Daily  . levETIRAcetam  500 mg Oral BID  . metoprolol succinate  25 mg Oral Daily  . multivitamin  1 tablet Oral QHS  . pantoprazole  40 mg Oral Daily  . potassium chloride  10 mEq Oral TID  . torsemide  100 mg Oral BID    Dialysis Orders: [place] followed by Dr. Candiss Norse. 4 exchanges overnight, 2000 cc and 1.5 hrs each.   Assessment/Plan:  1. Multivessel CAD with reduced EF 25/30%: Eval by TCTS, is"poor CABG candidate". s/pPCI to LAD with DESon 2/25.gettingPlavix and ASA 81 mg,+ statin 2. Debility: 2/2 Multiple med issues mainly CAD>now on CIR 3. ESRD - Continue [name]. Stable.  4. Hypokalemia: potassium increased to 4mEq TID, potassium level now at goal.  5. HTN/volume -BPin goal,on metoprolol 25 mg daily. When using 2.5% ptc/o feelingdizzy sonow using all 1.5% dextrose.Tolerating well. On torsemide for volumewith no volume excess on exam. 6. Anemia -Hgb 9- 11  range. s/p 1unitPRBCon 2/20 and on 2/27. Continue Aranesp 100 MCG q Saturday. tsat 91%,ferritin 1131. No need for iron at this time. 7. Secondary hyperparathyroidism: Phos at goal, no binders. On Sensipar 30mg  daily,stoppedRocaltrol with elevatedcorrectedcalcium>10, calcium now controlled. Phosphorus level at goal.  8. Hyponatremia -Chronic  9. Nutrition: Albumin low 1.7. On regular diet with prot supp.  10. Seizures - do to electrolyte abnormality in setting of C diff. Now on Keppra per neuro. No breakthrough seizures since admission to CIR  11. C diff - finished ABX course on 2/27. 12. Disposition-CIR admit for rehab   Anice Paganini, PA-C 07/18/2020, 12:06 PM  [facility] Pager: [phone]

## 2020-07-18 NOTE — Progress Notes (Signed)
Occupational Therapy Session Note  Patient Details  Name: NIMCO BIVENS MRN: [id number] Date of Birth: [birth year]  Today's Date: 07/18/2020 OT Individual Time: 4166-0630 OT Individual Time Calculation (min): 39 min    Short Term Goals: Week 2:  OT Short Term Goal 1 (Week 2): patient will complete bathing/dressing tasks edge of bed with set up/CS OT Short Term Goal 2 (Week 2): patient will complete functional transfers CS with RW OT Short Term Goal 3 (Week 2): patient will complete basic reach and transport of items with RW CS with good carryover of strategies  Skilled Therapeutic Interventions/Progress Updates:    Pt sitting in wheelchair to start session.  Therapist rolled her down to the dayroom where she transferred to the Nustep with min guard assist for strengthening tasks.  She was able to complete the Nustep for 2 intervals, 1 for 7 mins and then the other for 6 mins.  Resistance was set on level 4 with steps per minute at around 37-43.  After a brief rest, resistance was increased to 5 and steps were maintained around 35-37.  Finished session with transfer back to the wheelchair and pt rolling herself back to the room with supervision.  She then washed her hands at the sink and was left sitting up in preparation for lunch.  No significant pain noted during session with no report of light headedness.    Therapy Documentation Precautions:  Precautions Precautions: Fall Precaution Comments: generalized weakness, peritoneal diaylisis, seizures, monitor O2 during ambulation Restrictions Weight Bearing Restrictions: No   Pain: Pain Assessment Pain Scale: Faces Pain Score: 0-No pain Pain Type: Chronic pain Pain Location: Head Pain Descriptors / Indicators: Headache Pain Frequency: Constant Pain Onset: On-going Pain Intervention(s): Medication (See eMAR) ADL: See Care Tool Section for some details of mobility and selfcare  Therapy/Group: Individual Therapy  MCGUIRE,JAMES  OTR/L 07/18/2020, 12:50 PM

## 2020-07-18 NOTE — Plan of Care (Signed)
OT goals discussed with pt and her spouse during family education and both in agreement of modifications. Please see POC for details  Problem: RH Simple Meal Prep Goal: LTG Patient will perform simple meal prep w/assist (OT) Description: LTG: Patient will perform simple meal prep with assistance, with/without cues (OT). Outcome: Not Applicable Note: Goal d/c as pt will have family assist with meal prep at time of d/c   Problem: RH Balance Goal: LTG Patient will maintain dynamic standing with ADLs (OT) Description: LTG:  Patient will maintain dynamic standing balance with assist during activities of daily living (OT)  Flowsheets (Taken 07/18/2020 1233) LTG: Pt will maintain dynamic standing balance during ADLs with: Supervision/Verbal cueing Note: Downgraded due to slow progress   Problem: Sit to Stand Goal: LTG:  Patient will perform sit to stand in prep for activites of daily living with assistance level (OT) Description: LTG:  Patient will perform sit to stand in prep for activites of daily living with assistance level (OT) Flowsheets (Taken 07/18/2020 1233) LTG: PT will perform sit to stand in prep for activites of daily living with assistance level: Supervision/Verbal cueing Note: Downgraded due to slow progress   Problem: RH Dressing Goal: LTG Patient will perform upper body dressing (OT) Description: LTG Patient will perform upper body dressing with assist, with/without cues (OT). Flowsheets (Taken 07/18/2020 1233) LTG: Pt will perform upper body dressing with assistance level of: Set up assist Note: Downgraded due to slow progress Goal: LTG Patient will perform lower body dressing w/assist (OT) Description: LTG: Patient will perform lower body dressing with assist, with/without cues in positioning using equipment (OT) Flowsheets (Taken 07/18/2020 1233) LTG: Pt will perform lower body dressing with assistance level of: Supervision/Verbal cueing Note: Downgraded due to slow  progress   Problem: RH Toileting Goal: LTG Patient will perform toileting task (3/3 steps) with assistance level (OT) Description: LTG: Patient will perform toileting task (3/3 steps) with assistance level (OT)  Flowsheets (Taken 07/18/2020 1233) LTG: Pt will perform toileting task (3/3 steps) with assistance level: Supervision/Verbal cueing Note: Downgraded due to slow progress   Problem: RH Toilet Transfers Goal: LTG Patient will perform toilet transfers w/assist (OT) Description: LTG: Patient will perform toilet transfers with assist, with/without cues using equipment (OT) Flowsheets (Taken 07/18/2020 1233) LTG: Pt will perform toilet transfers with assistance level of: Supervision/Verbal cueing Note: Goal downgraded due to slow progress   Problem: RH Tub/Shower Transfers Goal: LTG Patient will perform tub/shower transfers w/assist (OT) Description: LTG: Patient will perform tub/shower transfers with assist, with/without cues using equipment (OT) Outcome: Not Applicable Note: Goal d/c as pt will be sponge bathing at home. Spouse and pt in agreement to pursue shower goal with f/u OT

## 2020-07-18 NOTE — Progress Notes (Signed)
PROGRESS NOTE   Subjective/Complaints: No initial complaints this morning. When asked, she notes trouble sleeping at night and would like to increase dose of medication. She takes Ambien 5mg . Trazodone contraindicated given qTc. Will add melatonin 3mg .   Review of systems: Denies CP, shortness of breath, nausea, vomiting, diarrhea, headaches better controlled, +insomnia  Objective:   No results found. Recent Labs    07/18/20 0455  WBC 6.5  HGB 9.7*  HCT 28.5*  PLT 215   Recent Labs    07/17/20 0520 07/18/20 0455  NA 127* 127*  K 3.8 3.7  CL 92* 93*  CO2 22 24  GLUCOSE 79 86  BUN 18 18  CREATININE 4.14* 4.13*  CALCIUM 7.8* 7.5*    Intake/Output Summary (Last 24 hours) at 07/18/2020 1414 Last data filed at 07/18/2020 0818 Gross per 24 hour  Intake 560 ml  Output --  Net 560 ml        Physical Exam: Vital Signs Blood pressure 105/71, pulse 86, temperature 98.3 F (36.8 C), temperature source Oral, resp. rate 16, height 5\' 4"  (1.626 m), weight 43.1 kg, SpO2 100 %. Gen: no distress, normal appearing HEENT: oral mucosa pink and moist, NCAT Cardio: Reg rate Chest: normal effort, normal rate of breathing Abd: soft, non-distended Ext: no edema Psych: pleasant, normal affect Skin:  Scattered bruising. Psych: Normal mood.  Normal behavior. Musc: No edema in extremities.  No tenderness in extremities. Neurologic: Alert Motor: 4/5 throughout, unchanged  Assessment/Plan: 1. Functional deficits which require 3+ hours per day of interdisciplinary therapy in a comprehensive inpatient rehab setting.  Physiatrist is providing close team supervision and 24 hour management of active medical problems listed below.  Physiatrist and rehab team continue to assess barriers to discharge/monitor patient progress toward functional and medical goals  Care Tool:  Bathing    Body parts bathed by patient: Right arm,Left  arm,Chest,Abdomen,Front perineal area,Right upper leg,Left upper leg,Face,Right lower leg,Left lower leg,Buttocks   Body parts bathed by helper: Buttocks     Bathing assist Assist Level: Supervision/Verbal cueing     Upper Body Dressing/Undressing Upper body dressing   What is the patient wearing?: Button up shirt    Upper body assist Assist Level: Set up assist    Lower Body Dressing/Undressing Lower body dressing      What is the patient wearing?: Underwear/pull up,Pants     Lower body assist Assist for lower body dressing: Supervision/Verbal cueing     Toileting Toileting    Toileting assist Assist for toileting: Minimal Assistance - Patient > 75%     Transfers Chair/bed transfer  Transfers assist     Chair/bed transfer assist level: Contact Guard/Touching assist     Locomotion Ambulation   Ambulation assist      Assist level: Contact Guard/Touching assist Assistive device: Walker-rolling Max distance: 150'   Walk 10 feet activity   Assist  Walk 10 feet activity did not occur: Safety/medical concerns  Assist level: Contact Guard/Touching assist Assistive device: Walker-rolling   Walk 50 feet activity   Assist Walk 50 feet with 2 turns activity did not occur: Safety/medical concerns  Assist level: Contact Guard/Touching assist Assistive device: Walker-rolling  Walk 150 feet activity   Assist Walk 150 feet activity did not occur: Safety/medical concerns  Assist level: Contact Guard/Touching assist Assistive device: Walker-rolling    Walk 10 feet on uneven surface  activity   Assist Walk 10 feet on uneven surfaces activity did not occur: Safety/medical concerns         Wheelchair     Assist Will patient use wheelchair at discharge?: Yes Type of Wheelchair: Manual Wheelchair activity did not occur: Safety/medical concerns  Wheelchair assist level: Minimal Assistance - Patient > 75% Max wheelchair distance: 120'     Wheelchair 50 feet with 2 turns activity    Assist    Wheelchair 50 feet with 2 turns activity did not occur: Safety/medical concerns   Assist Level: Minimal Assistance - Patient > 75%   Wheelchair 150 feet activity     Assist  Wheelchair 150 feet activity did not occur: Safety/medical concerns       Medical Problem List and Plan: 1.Functional and mobility deficitssecondary to debility after multiple medical issues-CHF, hx of C diff colitis, recent R hip fx 12/21 with ORIF  Continue CIR  2. Antithrombotics: -DVT/anticoagulation:Mechanical:Antiembolism stockings, knee (TED hose) Bilateral lower extremities Sequential compression devices, below kneeBilateral lower extremities -antiplatelet therapy: ASA/plavix 3.Chronic daily frequent HA/Pain Management:tylenol for basic pain -fioricet for breakthrough headaches (on this PTA)  Controlled with meds on 3/18  Continue topirmate 25mg  daily for headache prevention.  4. Mood:fairly positive. Team to provide ego support as needed -antipsychotic agents: n/a 5. Neuropsych: This patientiscapable of making decisions on herown behalf. 6. Skin/Wound Care:Routine pressure relief measures. 7. Fluids/Electrolytes/Nutrition:Monitor I/Os.  8. ESRD: Continue daily PD, dialysis doing well at this point. 9. CAD s/p PCI: On ASA/Plavix, metoprolol and lipitor. Monitor for symptoms with increase in activity.  10. New onset seizures: Continue Keppra bid Off Wellbutrin. Monitor for anxiety symptoms.  No breakthrough seizures since admission to CIR-3/17 11. H/o A flutter: Monitor HR tid--continue metoprolol. 12. Anemia of chronic disease: Monitor with serial H/H--improved post transfusion.  --On Aranesp weekly  Hemoglobin 10.4 on 3/14  Hemoccult negative   Continue to monitor 13. C diff colitis: Completed treatment with [facility] on  02/27 -off contact precautions 14. Chronic combined CHF: Takatsubo CM with EF--25-30% -- Monitor weights daily.  --On demadex bid with Kdur for supplement as well as metoprolol and statin. Filed Weights   07/17/20 1610 07/18/20 0414 07/18/20 0615  Weight: 43.9 kg 43.1 kg 43.1 kg   3/18 stable- continue to monitor.  15.  Nausea, recent C. difficile colitis, end-stage renal disease, gastroparesis  Suspect this is chronic given PD, electrolyte abnormalities, confirmed by daughter.  However, patient states this is new.  KUB ordered, showing oral contrast throughout the colon, otherwise relatively unremarkable  Continue Reglan 5 BID, repeat ECG showing persistent prolonged QTC, DC Reglan given improvement  Continue PPI-history of ulcer 16.  Sleep disturbance   Used Ambien at home.    Improving overall 17.  Hypoalbuminemia  Supplement initiated 18.  History of prolonged QTC  See #15  Zofran DC'd--patient states ineffective anyway. 19.  Hypokalemia  Potassium 3.1 on 3/14  Supplements being adjusted by nephrology-appreciate recommendations. 20. Insomnia: add melatonin 3mg .  LOS: 14 days A FACE TO FACE EVALUATION WAS PERFORMED  Martha Clan P Tylik Treese 07/18/2020, 2:14 PM

## 2020-07-18 NOTE — Progress Notes (Signed)
Patient ID: Amy [name], female   DOB: 10-Aug-1950, 70 y.o.   MRN: [id number] Met with pt since missed husband when here for education. He saw wife in [place] and is working on arranging someone to be there with her at home upon discharge. Between he and daughter, friend and is hiring another person pt will have 24/7 care at home. Pt has all needed equipment from past surgery. Home health via [facility] aware of pt's discharge date. Work toward discharge 3/23.

## 2020-07-18 NOTE — Progress Notes (Signed)
Physical Therapy Session Note  Patient Details  Name: Amy [name] MRN: [id number] Date of Birth: 11-Dec-1950  Today's Date: 07/18/2020 PT Individual Time: 1005-1048 PT Individual Time Calculation (min): 43 min   Short Term Goals: Week 2:  PT Short Term Goal 1 (Week 2): Patient will ambulate 120' consistently with CGA with RW. PT Short Term Goal 2 (Week 2): Patient will demonstrate standing dynamic balance with CGA using 1 hand support for functional activities. PT Short Term Goal 3 (Week 2): Patient will negotiate 4 steps with rail and CGA. PT Short Term Goal 4 (Week 2): Patient to tolerate standing activity for 10 minutes without seated rest.  Skilled Therapeutic Interventions/Progress Updates:   Pt received sitting in WC and agreeable to PT. WC mobility x 113f with supervision assist and cues for turning technique. Pt's husband present for entire session. Gait training with RW x 1552fand supervision assist, cues for proper guarding technique while providing supervision assist. Stair management training 2 x 2 with min assist from PT and then from husband with stability provided pt PT and then husband to RW using posterior Ascent. Education provided by PT to adjust RW feet as able to improve stability of stair ascent. Car transfer training with supervision-min assist from PT and then from husband with use of RW to sedan height. Education provided for use of CaHenry Scheinf pt demonstrates need for UE support, rather than pulling on Pt's UE. Patient returned to [facility] and [facility] reach and all needs met.         Therapy Documentation Precautions:  Precautions Precautions: Fall Precaution Comments: generalized weakness, peritoneal diaylisis, seizures, monitor O2 during ambulation Restrictions Weight Bearing Restrictions: No Vital Signs: Therapy Vitals Temp: 97.9 F (36.6 C) Pulse Rate: 87 Resp: 17 BP: 97/74 Patient Position (if appropriate): Sitting Oxygen  Therapy SpO2: 100 % O2 Device: Room Air Pain: Denies   Therapy/Group: Individual Therapy  AuLorie Phenix/18/2022, 3:36 PM

## 2020-07-18 NOTE — Progress Notes (Signed)
Occupational Therapy Session Note  Patient Details  Name: Amy [name] MRN: [id number] Date of Birth: 01-Jul-1950  Today's Date: 07/18/2020 OT Individual Time: 3086-5784 OT Individual Time Calculation (min): 66 min   Short Term Goals: Week 2:  OT Short Term Goal 1 (Week 2): patient will complete bathing/dressing tasks edge of bed with set up/CS OT Short Term Goal 2 (Week 2): patient will complete functional transfers CS with RW OT Short Term Goal 3 (Week 2): patient will complete basic reach and transport of items with RW CS with good carryover of strategies    Skilled Therapeutic Interventions/Progress Updates:    Pt greeted in bed with spouse Ronalee Belts present for family education, RN present as well. Pt with no c/o pain this morning. Agreeable to start session by practicing toilet transfer with Ronalee Belts providing close [place] assistance while pt used the RW. Note that pt had an incontinent BM in transit to elevated toilet. Spouse Ronalee Belts was guided in assisting pt with pericare and ADL set at sit<stand level from toilet using RW for standing support. Pt wanted to remain sitting on the toilet because she was unsure if she would have to return to it quickly. Advised using pull ups at home until incontinent episodes clear. Pt required close supervision-CGA for stated tasks, Ronalee Belts spot-checked pt with pericare to ensure cleanliness. We reviewed pts energy conservation packet and functional implications during ADL/IADL engagement at home. Encouraged Ronalee Belts to assist pt with daily therapeutic activity to further increase functional gains. Also went over her theraputty HEP for hand strengthening. Pt then transferred to the w/c parked in front of sink. OT demonstrated to Ronalee Belts how to wash pts hair using the hair washing tray. Advised for pt to sponge bathe at home initially until given medical clearance from MD and then assistance from f/u OT. Pt and spouse verbalized understanding. Pt remained in the w/c at close  of session, husband assisting pt with hair care until next therapist arrived for family ed. Both pt and spouse aware that they will need staff present for transfer needs.   Therapy Documentation Precautions:  Precautions Precautions: Fall Precaution Comments: generalized weakness, peritoneal diaylisis, seizures, monitor O2 during ambulation Restrictions Weight Bearing Restrictions: No Pain: Pain Assessment Pain Scale: 0-10 Pain Score: 7  Pain Type: Chronic pain Pain Location: Head Pain Descriptors / Indicators: Headache Pain Frequency: Constant Pain Onset: On-going Pain Intervention(s): Medication (See eMAR) ADL: ADL Eating: Supervision/safety Where Assessed-Eating: Edge of bed Grooming: Supervision/safety Where Assessed-Grooming: Sitting at sink Upper Body Bathing: Supervision/safety Where Assessed-Upper Body Bathing: Edge of bed,Sitting at sink Lower Body Bathing: Minimal assistance Where Assessed-Lower Body Bathing: Sitting at sink,Edge of bed Upper Body Dressing: Supervision/safety Where Assessed-Upper Body Dressing: Edge of bed,Sitting at sink Lower Body Dressing: Moderate assistance Where Assessed-Lower Body Dressing: Edge of bed,Sitting at sink Toileting: Moderate assistance Where Assessed-Toileting: Glass blower/designer: Psychiatric nurse Method: Arts development officer: Radiographer, therapeutic: Minimal Museum/gallery conservator Method: Stand pivot Tub/Shower Equipment: Shower seat with back,Grab bars,Walk in shower      Therapy/Group: Individual Therapy  Stylianos Stradling A Juwon Scripter 07/18/2020, 12:32 PM

## 2020-07-18 NOTE — Progress Notes (Signed)
Physical Therapy Weekly Progress Note  Patient Details  Name: Amy [name] MRN: [id number] Date of Birth: [birth year]  Beginning of progress report period: July 11, 2020 End of progress report period: July 18, 2020  Today's Date: 07/18/2020 PT Individual Time: 1415-1450 PT Individual Time Calculation (min): 35 min   Patient has met 4 of 4 short term goals.  Patient progressing with standing tolerance, stair negotiation, ambulation distance and balance this week despite having some medical issues including N&V, low BP and now with some mucous loose stools.  Spouse in today for training and plans to hire assistance in addition to having help from family.  Feel she will continue to progress to S level goals overall.   Patient continues to demonstrate the following deficits muscle weakness and limited activity tolerance, decreased balance and safety awareness and therefore will continue to benefit from skilled PT intervention to increase functional independence with mobility.  Patient progressing toward long term goals..  Continue plan of care.  PT Short Term Goals Week 2:  PT Short Term Goal 1 (Week 2): Patient will ambulate 120' consistently with CGA with RW. PT Short Term Goal 1 - Progress (Week 2): Met PT Short Term Goal 2 (Week 2): Patient will demonstrate standing dynamic balance with CGA using 1 hand support for functional activities. PT Short Term Goal 2 - Progress (Week 2): Met PT Short Term Goal 3 (Week 2): Patient will negotiate 4 steps with rail and CGA. PT Short Term Goal 3 - Progress (Week 2): Met PT Short Term Goal 4 (Week 2): Patient to tolerate standing activity for 10 minutes without seated rest. PT Short Term Goal 4 - Progress (Week 2): Met Week 3:  PT Short Term Goal 1 (Week 3): STG=LTG due to ELOS  Skilled Therapeutic Interventions/Progress Updates:  Patient in supine and asleep, but easily aroused.  Reports had three other sessions today and fatigued, but willing to  work.  Time spent educating patient on need for energy conservation due to heart failure and needing lot of rest breaks between activity.  Also education that heart is a muscle and can get some stronger with some cardiovascular endurance activities which is why showed her the foot pedal exerciser to use at home and feel she can benefit from outpatient cardiac rehab in the future.  Patient supine to sit with S and upon standing to RW with S felt need to toilet.  Ambulated 10' to bathroom with CGA with RW.  Toileted with min A due to changing mesh panties and pad as small soiling with yellow mucous stool (RN made aware).  Obtained new panties and pad and pt donned with min A.  Performed hygiene with S.  Patient washed hands at sink and ambulated 40' to hallway where used wall rail for balance/coordination activities to include side stepping with crossing over in front, then crossing over behind, then gait forward with only 1 hand supported on rail noting significant trendelenburg on R with R hand on rail, then less noticeable with L hand on rail.  Standing hip extension x 10 and marching x 10.  Ambulated in room to recliner and placed w/c cushion in chair for better support and pt left sitting with call bell and needs in reach education to call for assistance when getting up.  Ambulation/gait training;Balance/vestibular training;Community reintegration;Discharge planning;Disease management/prevention;DME/adaptive equipment instruction;Functional mobility training;Neuromuscular re-education;Pain management;Patient/family education;Psychosocial support;Skin care/wound management;Stair training;Therapeutic Activities;Therapeutic Exercise;UE/LE Strength taining/ROM;UE/LE Coordination activities;Visual/perceptual remediation/compensation   Therapy Documentation Precautions:  Precautions Precautions: Fall  Precaution Comments: generalized weakness, peritoneal diaylisis, seizures, monitor O2 during  ambulation Restrictions Weight Bearing Restrictions: No Pain: Pain Assessment Pain Score: 0-No pain  Therapy/Group: Individual Therapy  Reginia Naas  [place], [place] 07/18/2020, 4:24 PM

## 2020-07-19 LAB — RENAL FUNCTION PANEL
Albumin: 1.5 g/dL — ABNORMAL LOW (ref 3.5–5.0)
Anion gap: 11 (ref 5–15)
BUN: 17 mg/dL (ref 8–23)
CO2: 23 mmol/L (ref 22–32)
Calcium: 7.5 mg/dL — ABNORMAL LOW (ref 8.9–10.3)
Chloride: 92 mmol/L — ABNORMAL LOW (ref 98–111)
Creatinine, Ser: 4.06 mg/dL — ABNORMAL HIGH (ref 0.44–1.00)
GFR, Estimated: 11 mL/min — ABNORMAL LOW (ref >60)
Glucose, Bld: 80 mg/dL (ref 70–99)
Phosphorus: 3.4 mg/dL (ref 2.5–4.6)
Potassium: 3.6 mmol/L (ref 3.5–5.1)
Sodium: 126 mmol/L — ABNORMAL LOW (ref 135–145)

## 2020-07-19 MED ORDER — HEPARIN 1000 UNIT/ML FOR PERITONEAL DIALYSIS
500.0000 [IU] | INTRAMUSCULAR | Status: DC | PRN
  Filled 2020-07-19: qty 0.5

## 2020-07-19 MED ORDER — DELFLEX-LC/2.5% DEXTROSE 394 MOSM/L IP SOLN: INTRAPERITONEAL | Status: AC

## 2020-07-19 MED ORDER — DELFLEX-LC/1.5% DEXTROSE 344 MOSM/L IP SOLN: INTRAPERITONEAL | Status: AC

## 2020-07-19 MED ORDER — GENTAMICIN SULFATE 0.1 % EX CREA
1.0000 "application " | TOPICAL_CREAM | Freq: Every day | CUTANEOUS | Status: DC
  Administered 2020-07-19 – 2020-07-24 (×6): 1 via TOPICAL
  Filled 2020-07-19: qty 15

## 2020-07-19 NOTE — Progress Notes (Signed)
[facility] Progress Note   Subjective:   Reports nausea with vomiting x2 yesterday and 1 episode of diarrhea. No GI upset so far today. Denies abdominal pain, SOB, cough, CP and dizziness.   Objective Vitals:   07/18/20 1945 07/19/20 0409 07/19/20 0450 07/19/20 0630  BP: 104/67 (!) 98/57  99/70  Pulse: 87 83  84  Resp: 18 16  16   Temp: 98.1 F (36.7 C) 100.3 F (37.9 C) 98.3 F (36.8 C) 99.8 F (37.7 C)  TempSrc:  Oral Oral Oral  SpO2: 100% 99%  99%  Weight:  43.1 kg  43.1 kg  Height:       Physical Exam General: Well developed female, alert and in NAD Heart: RRR, no murmurs, rubs or gallops Lungs: CTA bilaterally without wheezing, rhonchi or rales Abdomen: Soft, non-tender, non-distended, +BS Extremities:No edema b/l lower extremities Dialysis Access:  PD cath in RLQ, no erythema/drainage, no TTP  Additional Objective Labs: Basic Metabolic Panel: Recent Labs  Lab 07/17/20 0520 07/18/20 0455 07/19/20 0451  NA 127* 127* 126*  K 3.8 3.7 3.6  CL 92* 93* 92*  CO2 22 24 23   GLUCOSE 79 86 80  BUN 18 18 17   CREATININE 4.14* 4.13* 4.06*  CALCIUM 7.8* 7.5* 7.5*  PHOS 3.7 3.6 3.4   Liver Function Tests: Recent Labs  Lab 07/17/20 0520 07/18/20 0455 07/19/20 0451  ALBUMIN 1.5* 1.5* 1.5*   CBC: Recent Labs  Lab 07/14/20 0453 07/18/20 0455  WBC 7.1 6.5  NEUTROABS 4.6  --   HGB 10.4* 9.7*  HCT 30.6* 28.5*  MCV 95.9 96.6  PLT 252 215   Blood Culture    Component Value Date/Time   SDES BLOOD BLOOD RIGHT HAND 05/29/2020 2043   SPECREQUEST  05/29/2020 2043    BOTTLES DRAWN AEROBIC AND ANAEROBIC Blood Culture adequate volume   CULT  05/29/2020 2043    NO GROWTH 5 DAYS Performed at [facility], [address] [place] [address]    REPTSTATUS 06/03/2020 FINAL 05/29/2020 2043   Medications: . dialysis solution 1.5% low-MG/low-CA     . aspirin  81 mg Oral Daily  . atorvastatin  80 mg Oral q1800  . Chlorhexidine  Gluconate Cloth  6 each Topical Daily  . cinacalcet  30 mg Oral Q breakfast  . clopidogrel  75 mg Oral Daily  . darbepoetin (ARANESP) injection - NON-DIALYSIS  100 mcg Subcutaneous Q Sat-1800  . feeding supplement  1 Container Oral TID BM  . gentamicin cream  1 application Topical Daily  . levETIRAcetam  500 mg Oral BID  . melatonin  3 mg Oral QHS  . metoprolol succinate  25 mg Oral Daily  . multivitamin  1 tablet Oral QHS  . pantoprazole  40 mg Oral Daily  . potassium chloride  10 mEq Oral TID  . torsemide  100 mg Oral BID    Dialysis Orders: [place] followed by Dr. Candiss Norse. 4 exchanges overnight, 2000 cc and 1.5 hrs each.   Assessment/Plan:  1. Multivessel CAD with reduced EF 25/30%: Eval by TCTS, is"poor CABG candidate". s/pPCI to LAD with DESon 2/25.gettingPlavix and ASA 81 mg,+ statin 2. Debility: 2/2 Multiple med issues mainly CAD>now on CIR 3. ESRD - Continue [name]. Stable.  4. Hypokalemia: potassium increased to 51mEq TID, potassium level now at goal.  5. HTN/volume -BPin goal,on metoprolol 25 mg daily. When using 2.5% ptc/o feelingdizzy sonow using all 1.5% dextrose, but Na is trending down further so will  attempt two exchanges with 2.5% dextrose tonight.Also on torsemide 6. Anemia -Hgb 9- 11 range. s/p 1unitPRBCon 2/20 and on 2/27. Continue Aranesp 100 MCG q Saturday. tsat 91%,ferritin 1131. No need for iron at this time. 7. Secondary hyperparathyroidism: Phos at goal, no binders. On Sensipar 30mg  daily,stoppedRocaltrol withelevatedcorrectedcalcium>10, calcium now controlled. Phosphorus level at goal.  8. Hyponatremia -Chronic but worsening, will attempt higher UF with some 2.5% exchanges tonight, see above.  9. Nutrition: Albumin low 1.7. On regular diet with prot supp.  10. Seizures - do to electrolyte abnormality in setting of C diff. Now on Keppra per neuro. No breakthrough seizures since admission to CIR  11. C diff -  finished ABX course on 2/27.Reports diarrhea x1 yesterday 12. Disposition-CIR admit for rehab 13. Nausea-unclear cause, no evidence of peritonitis. Corrected electrolyte abnormalities as above and has compazine PRN ordered.     Anice Paganini, PA-C 07/19/2020, 8:53 AM  [facility] Pager: [phone]

## 2020-07-19 NOTE — Progress Notes (Signed)
Pt completed PD. UF 1017. No signs of distress or infection. Pt denies complaints

## 2020-07-19 NOTE — Progress Notes (Signed)
Occupational Therapy Session Note  Patient Details  Name: Amy [name] MRN: [id number] Date of Birth: [birth year]  Today's Date: 07/19/2020 OT Individual Time: 1004-1049 OT Individual Time Calculation (min): 45 min    Short Term Goals: Week 2:  OT Short Term Goal 1 (Week 2): patient will complete bathing/dressing tasks edge of bed with set up/CS OT Short Term Goal 2 (Week 2): patient will complete functional transfers CS with RW OT Short Term Goal 3 (Week 2): patient will complete basic reach and transport of items with RW CS with good carryover of strategies  Skilled Therapeutic Interventions/Progress Updates:    Treatment session with focus on self-care retraining, functional transfers, and overall endurance.  Pt received semi-reclined in bed reporting not feeling well. Pt reports having thrown up yesterday afternoon/evening and still not feeling well.  However pt agreeable to completing self-care tasks.  Pt completed stand pivot transfer bed > w/c CGA.  Engaged in oral care seated at sink for energy conservation.  Pt requested to wash hair.  Therapist utilized hair washing tray followed by pt brushing and blow drying hair while seated at sink.  Pt completed UB bathing and donned clean shirt with setup.  Pt reports need to toilet.  Completed stand pivot transfer w/c > toilet with CGA with use of grab bars.  Pt with loose BM, left on toilet with nurse tech present to provide supervision.  Therapy Documentation Precautions:  Precautions Precautions: Fall Precaution Comments: generalized weakness, peritoneal diaylisis, seizures, monitor O2 during ambulation Restrictions Weight Bearing Restrictions: No Pain: Pain Assessment Pain Scale: 0-10 Pain Score: 0-No pain  Therapy/Group: Individual Therapy  Simonne Come 07/19/2020, 12:02 PM

## 2020-07-19 NOTE — Progress Notes (Signed)
Physical Therapy Session Note  Patient Details  Name: Amy [name] MRN: [id number] Date of Birth: January 27, 1951  Today's Date: 07/19/2020 PT Individual Time: 7673-4193 PT Individual Time Calculation (min): 43 min   Short Term Goals: Week 3:  PT Short Term Goal 1 (Week 3): STG=LTG due to ELOS  Skilled Therapeutic Interventions/Progress Updates:    Pt received supine in bed hooked up to peritoneal dialysis. Confirmed with Josh, RN and pt cleared to participate in therapy in room while maintaining dialysis connection. Supine>sitting R EOB, HOB elevated, supervision. Performed the following seated exercises working on B LE strengthening, core activation, and activity tolerance:  - B LE long arc quads against 4lb ankle weight resistance 2x15 reps each LE - B LE marches against 4lb ankle weight resistance 2x15 reps each with pt demonstrating more significant weakness in R hip flexors compared to L, no UE support to increase core activation - B LE ankle PF against level 2 theraband resistance 2x15 reps while maintaining sustained knee extension against 4lb ankle weight throughout the ankle exercise - B UE exercise holding 4lb dowel rod for ball toss activity 2x10 reps Therapist providing cuing for proper form/technique to increase muscle activation for each exercise. Pt requires seated rest breaks throughout session due to impaired endurance. Pt left seated on EOB with RN present to assume care of pt and all lines intact.  Therapy Documentation Precautions:  Precautions Precautions: Fall Precaution Comments: generalized weakness, peritoneal diaylisis, seizures, monitor O2 during ambulation Restrictions Weight Bearing Restrictions: No  Pain: No reports of pain throughout session.   Therapy/Group: Individual Therapy  Tawana Scale , PT, DPT, CSRS  07/19/2020, 3:29 PM

## 2020-07-19 NOTE — Progress Notes (Signed)
PROGRESS NOTE   Subjective/Complaints: Slept a little better last night. Concerned that her stool might be c diff positive. Had multiple loose stools yesterday, but last two have been mushy and at 2100 and 0700 this morning respectively.   ROS: Patient denies fever, rash, sore throat, blurred vision, nausea, vomiting, diarrhea, cough, shortness of breath or chest pain, joint or back pain, headache, or mood change.    Objective:   No results found. Recent Labs    07/18/20 0455  WBC 6.5  HGB 9.7*  HCT 28.5*  PLT 215   Recent Labs    07/18/20 0455 07/19/20 0451  NA 127* 126*  K 3.7 3.6  CL 93* 92*  CO2 24 23  GLUCOSE 86 80  BUN 18 17  CREATININE 4.13* 4.06*  CALCIUM 7.5* 7.5*    Intake/Output Summary (Last 24 hours) at 07/19/2020 1032 Last data filed at 07/19/2020 0855 Gross per 24 hour  Intake 120 ml  Output --  Net 120 ml        Physical Exam: Vital Signs Blood pressure 99/70, pulse 84, temperature 99.8 F (37.7 C), temperature source Oral, resp. rate 16, height 5\' 4"  (1.626 m), weight 43.1 kg, SpO2 99 %. Constitutional: No distress . Vital signs reviewed. HEENT: EOMI, oral membranes moist Neck: supple Cardiovascular: RRR without murmur. No JVD    Respiratory/Chest: CTA Bilaterally without wheezes or rales. Normal effort    GI/Abdomen: BS +, non-tender, non-distended, soft Ext: no clubbing, cyanosis, or edema Psych: pleasant and cooperative Skin:  Scattered bruising. Psych: Normal mood.  Normal behavior. Musc: No edema in extremities.  No tenderness in extremities. Neurologic: Alert and oriented. Normal insight and awareness Motor: 4/5 throughout, unchanged  Assessment/Plan: 1. Functional deficits which require 3+ hours per day of interdisciplinary therapy in a comprehensive inpatient rehab setting.  Physiatrist is providing close team supervision and 24 hour management of active medical problems  listed below.  Physiatrist and rehab team continue to assess barriers to discharge/monitor patient progress toward functional and medical goals  Care Tool:  Bathing    Body parts bathed by patient: Right arm,Left arm,Chest,Abdomen,Front perineal area,Right upper leg,Left upper leg,Face,Right lower leg,Left lower leg,Buttocks   Body parts bathed by helper: Buttocks     Bathing assist Assist Level: Supervision/Verbal cueing     Upper Body Dressing/Undressing Upper body dressing   What is the patient wearing?: Button up shirt    Upper body assist Assist Level: Set up assist    Lower Body Dressing/Undressing Lower body dressing      What is the patient wearing?: Underwear/pull up,Pants     Lower body assist Assist for lower body dressing: Supervision/Verbal cueing     Toileting Toileting    Toileting assist Assist for toileting: Minimal Assistance - Patient > 75%     Transfers Chair/bed transfer  Transfers assist     Chair/bed transfer assist level: Contact Guard/Touching assist     Locomotion Ambulation   Ambulation assist      Assist level: Contact Guard/Touching assist Assistive device: Walker-rolling Max distance: 40'   Walk 10 feet activity   Assist  Walk 10 feet activity did not occur: Safety/medical concerns  Assist level: Contact Guard/Touching assist Assistive device: Walker-rolling   Walk 50 feet activity   Assist Walk 50 feet with 2 turns activity did not occur: Safety/medical concerns  Assist level: Contact Guard/Touching assist Assistive device: Walker-rolling    Walk 150 feet activity   Assist Walk 150 feet activity did not occur: Safety/medical concerns  Assist level: Contact Guard/Touching assist Assistive device: Walker-rolling    Walk 10 feet on uneven surface  activity   Assist Walk 10 feet on uneven surfaces activity did not occur: Safety/medical concerns         Wheelchair     Assist Will patient use  wheelchair at discharge?: Yes Type of Wheelchair: Manual Wheelchair activity did not occur: Safety/medical concerns  Wheelchair assist level: Minimal Assistance - Patient > 75% Max wheelchair distance: 120'    Wheelchair 50 feet with 2 turns activity    Assist    Wheelchair 50 feet with 2 turns activity did not occur: Safety/medical concerns   Assist Level: Minimal Assistance - Patient > 75%   Wheelchair 150 feet activity     Assist  Wheelchair 150 feet activity did not occur: Safety/medical concerns       Medical Problem List and Plan: 1.Functional and mobility deficitssecondary to debility after multiple medical issues-CHF, hx of C diff colitis, recent R hip fx 12/21 with ORIF  Continue CIR PT, OT 2. Antithrombotics: -DVT/anticoagulation:Mechanical:Antiembolism stockings, knee (TED hose) Bilateral lower extremities Sequential compression devices, below kneeBilateral lower extremities -antiplatelet therapy: ASA/plavix 3.Chronic daily frequent HA/Pain Management:tylenol for basic pain -fioricet for breakthrough headaches (on this PTA)  Controlled with meds on 3/18  Continue topirmate 25mg  daily for headache prevention.  4. Mood:fairly positive. Team to provide ego support as needed -antipsychotic agents: n/a 5. Neuropsych: This patientiscapable of making decisions on herown behalf. 6. Skin/Wound Care:Routine pressure relief measures. 7. Fluids/Electrolytes/Nutrition:Monitor I/Os.  8. ESRD: Continue daily PD, dialysis doing well at this point. 9. CAD s/p PCI: On ASA/Plavix, metoprolol and lipitor. Monitor for symptoms with increase in activity.  10. New onset seizures: Continue Keppra bid Off Wellbutrin. Monitor for anxiety symptoms.  No breakthrough seizures since admission to CIR-3/17 11. H/o A flutter: Monitor HR tid--continue metoprolol. 12. Anemia of chronic disease: Monitor with  serial H/H--improved post transfusion.  --On Aranesp weekly  Hemoglobin 10.4 on 3/14  Hemoccult negative   Continue to monitor 13. C diff colitis: Completed treatment with Dificid on 02/27 -had one watery stool yesterday followed by small and then smear mushy stool. Don't think we need to check stool for c diff.    -3/19 add probiotic   -off reglan, on no meds which would be contributing 14. Chronic combined CHF: Takatsubo CM with EF--25-30% -- Monitor weights daily.  --On demadex bid with Kdur for supplement as well as metoprolol and statin. Filed Weights   07/18/20 1810 07/19/20 0409 07/19/20 0630  Weight: 43.5 kg 43.1 kg 43.1 kg   3/18 stable- continue to monitor.  15.  Nausea, recent C. difficile colitis, end-stage renal disease, gastroparesis  Suspect this is chronic given PD, electrolyte abnormalities, confirmed by daughter.  However, patient states this is new.  KUB ordered, showing oral contrast throughout the colon, otherwise relatively unremarkable  Continue Reglan 5 BID, repeat ECG showing persistent prolonged QTC, DC Reglan given improvement  Continue PPI-history of ulcer 16.  Sleep disturbance   Continue melatonin scheduled and ambien prn  -some improvement 17.  Hypoalbuminemia  Supplement initiated 18.  History of prolonged QTC  See #15  Zofran DC'd--patient states ineffective anyway. 19.  Hypokalemia  Potassium 3.1 on 3/14  Continue per renal recs    LOS: 15 days A FACE TO [place]  Meredith Staggers 07/19/2020, 10:32 AM

## 2020-07-20 LAB — RENAL FUNCTION PANEL
Albumin: 1.4 g/dL — ABNORMAL LOW (ref 3.5–5.0)
Anion gap: 15 (ref 5–15)
BUN: 17 mg/dL (ref 8–23)
CO2: 18 mmol/L — ABNORMAL LOW (ref 22–32)
Calcium: 7.4 mg/dL — ABNORMAL LOW (ref 8.9–10.3)
Chloride: 92 mmol/L — ABNORMAL LOW (ref 98–111)
Creatinine, Ser: 3.88 mg/dL — ABNORMAL HIGH (ref 0.44–1.00)
GFR, Estimated: 12 mL/min — ABNORMAL LOW
Glucose, Bld: 80 mg/dL (ref 70–99)
Phosphorus: 3.5 mg/dL (ref 2.5–4.6)
Potassium: 3.8 mmol/L (ref 3.5–5.1)
Sodium: 125 mmol/L — ABNORMAL LOW (ref 135–145)

## 2020-07-20 MED ORDER — NEPRO/CARBSTEADY PO LIQD
237.0000 mL | Freq: Three times a day (TID) | ORAL | Status: DC
  Administered 2020-07-20 – 2020-07-23 (×6): 237 mL via ORAL

## 2020-07-20 MED ORDER — SACCHAROMYCES BOULARDII 250 MG PO CAPS
250.0000 mg | ORAL_CAPSULE | Freq: Two times a day (BID) | ORAL | Status: DC
  Administered 2020-07-20 – 2020-07-24 (×9): 250 mg via ORAL
  Filled 2020-07-20 (×9): qty 1

## 2020-07-20 MED ORDER — PROSOURCE PLUS PO LIQD
30.0000 mL | Freq: Two times a day (BID) | ORAL | Status: DC
  Administered 2020-07-21 – 2020-07-22 (×2): 30 mL via ORAL
  Filled 2020-07-20 (×6): qty 30

## 2020-07-20 NOTE — Progress Notes (Addendum)
PROGRESS NOTE   Subjective/Complaints: Experienced n/v yesterday.  compazine seemed to help. Has had n/v off and on during admit. Mushy stool today but no diarrhea  ROS: Patient denies fever, rash, sore throat, blurred vision, cough, shortness of breath or chest pain, joint or back pain, headache, or mood change.   Objective:   No results found. Recent Labs    07/18/20 0455  WBC 6.5  HGB 9.7*  HCT 28.5*  PLT 215   Recent Labs    07/19/20 0451 07/20/20 0447  NA 126* 125*  K 3.6 3.8  CL 92* 92*  CO2 23 18*  GLUCOSE 80 80  BUN 17 17  CREATININE 4.06* 3.88*  CALCIUM 7.5* 7.4*   No intake or output data in the 24 hours ending 07/20/20 0959      Physical Exam: Vital Signs Blood pressure 117/75, pulse 79, temperature 98.5 F (36.9 C), temperature source Oral, resp. rate 16, height 5\' 4"  (1.626 m), weight 41.1 kg, SpO2 98 %. Constitutional: No distress . Vital signs reviewed. HEENT: EOMI, oral membranes moist Neck: supple Cardiovascular: RRR without murmur. No JVD    Respiratory/Chest: CTA Bilaterally without wheezes or rales. Normal effort    GI/Abdomen: BS +, non-tender, non-distended Ext: no clubbing, cyanosis, or edema Psych: pleasant and cooperative Skin:  Scattered bruising in UE's. PD site clean/dry Psych: Normal mood.  Normal behavior. Musc: No edema in extremities.  No tenderness in extremities. Neurologic: Alert and oriented. Normal insight and awareness Motor: 4/5 throughout, unchanged  Assessment/Plan: 1. Functional deficits which require 3+ hours per day of interdisciplinary therapy in a comprehensive inpatient rehab setting.  Physiatrist is providing close team supervision and 24 hour management of active medical problems listed below.  Physiatrist and rehab team continue to assess barriers to discharge/monitor patient progress toward functional and medical goals  Care Tool:  Bathing     Body parts bathed by patient: Right arm,Left arm,Chest,Abdomen,Front perineal area,Right upper leg,Left upper leg,Face,Right lower leg,Left lower leg,Buttocks   Body parts bathed by helper: Buttocks     Bathing assist Assist Level: Supervision/Verbal cueing     Upper Body Dressing/Undressing Upper body dressing   What is the patient wearing?: Pull over shirt    Upper body assist Assist Level: Set up assist    Lower Body Dressing/Undressing Lower body dressing      What is the patient wearing?: Underwear/pull up,Pants     Lower body assist Assist for lower body dressing: Supervision/Verbal cueing     Toileting Toileting    Toileting assist Assist for toileting: Minimal Assistance - Patient > 75%     Transfers Chair/bed transfer  Transfers assist     Chair/bed transfer assist level: Contact Guard/Touching assist     Locomotion Ambulation   Ambulation assist      Assist level: Contact Guard/Touching assist Assistive device: Walker-rolling Max distance: 40'   Walk 10 feet activity   Assist  Walk 10 feet activity did not occur: Safety/medical concerns  Assist level: Contact Guard/Touching assist Assistive device: Walker-rolling   Walk 50 feet activity   Assist Walk 50 feet with 2 turns activity did not occur: Safety/medical concerns  Assist level: Contact  Guard/Touching assist Assistive device: Walker-rolling    Walk 150 feet activity   Assist Walk 150 feet activity did not occur: Safety/medical concerns  Assist level: Contact Guard/Touching assist Assistive device: Walker-rolling    Walk 10 feet on uneven surface  activity   Assist Walk 10 feet on uneven surfaces activity did not occur: Safety/medical concerns         Wheelchair     Assist Will patient use wheelchair at discharge?: Yes Type of Wheelchair: Manual Wheelchair activity did not occur: Safety/medical concerns  Wheelchair assist level: Minimal Assistance -  Patient > 75% Max wheelchair distance: 120'    Wheelchair 50 feet with 2 turns activity    Assist    Wheelchair 50 feet with 2 turns activity did not occur: Safety/medical concerns   Assist Level: Minimal Assistance - Patient > 75%   Wheelchair 150 feet activity     Assist  Wheelchair 150 feet activity did not occur: Safety/medical concerns       Medical Problem List and Plan: 1.Functional and mobility deficitssecondary to debility after multiple medical issues-CHF, hx of C diff colitis, recent R hip fx 12/21 with ORIF  -Continue CIR therapies including PT, OT  2. Antithrombotics: -DVT/anticoagulation:Mechanical:Antiembolism stockings, knee (TED hose) Bilateral lower extremities Sequential compression devices, below kneeBilateral lower extremities -antiplatelet therapy: ASA/plavix 3.Chronic daily frequent HA/Pain Management:tylenol for basic pain -fioricet for breakthrough headaches (on this PTA)  Controlled with meds on 3/18  Continue topirmate 25mg  daily for headache prevention.  4. Mood:fairly positive. Team to provide ego support as needed -antipsychotic agents: n/a 5. Neuropsych: This patientiscapable of making decisions on herown behalf. 6. Skin/Wound Care:Routine pressure relief measures. 7. Fluids/Electrolytes/Nutrition:Monitor I/Os.  8. ESRD: Continue daily PD, dialysis doing well at this point. 9. CAD s/p PCI: On ASA/Plavix, metoprolol and lipitor. Monitor for symptoms with increase in activity.  10. New onset seizures: Continue Keppra bid Off Wellbutrin. Monitor for anxiety symptoms.  No breakthrough seizures since admission to CIR-3/17 11. H/o A flutter: Monitor HR tid--continue metoprolol. 12. Anemia of chronic disease: Monitor with serial H/H--improved post transfusion.  --On Aranesp weekly  Hemoglobin 10.4 on 3/14  Hemoccult negative   Continue to monitor 13.  C diff colitis: Completed treatment with Dificid on 02/27 -had one watery stool 3/18 followed by small and then smear mushy stool. Don't think we need to check stool for c diff.    -3/19 added probiotic   -off reglan, on no meds which would be contributing   3/20 continue to observe--mushy stools still 14. Chronic combined CHF: Takatsubo CM with EF--25-30% -- Monitor weights daily.  --On demadex bid with Kdur for supplement as well as metoprolol and statin. Filed Weights   07/19/20 0630 07/20/20 0426 07/20/20 0725  Weight: 43.1 kg 41.1 kg 41.1 kg   3/20 stable- continue to monitor.  15.  Nausea, recent C. difficile colitis, end-stage renal disease, gastroparesis  Suspect this is chronic given PD, electrolyte abnormalities, confirmed by daughter.  However, patient states this is new.  KUB ordered, showing oral contrast throughout the colon, otherwise relatively unremarkable  Continue Reglan 5 BID, repeat ECG showing persistent prolonged QTC, DC Reglan given improvement  Continue PPI-history of ulcer  -prn compazine  16.  Sleep disturbance   Continue melatonin scheduled and ambien prn  -some improvement 17.  Hypoalbuminemia  Supplement initiated 18.  History of prolonged QTC  See #15  Zofran DC'd--patient states ineffective anyway. 19.  Hypokalemia  Potassium 3.8 3/20  Continue per renal recs  LOS: 16 days A FACE TO FACE EVALUATION WAS PERFORMED  Meredith Staggers 07/20/2020, 9:59 AM

## 2020-07-20 NOTE — Progress Notes (Signed)
[facility] Progress Note   Subjective:   Seen in room.  No issues overnight.  Na still low.  Discussed protein.    Objective Vitals:   07/19/20 1312 07/19/20 2019 07/20/20 0426 07/20/20 0725  BP: 116/71 102/63 106/66 117/75  Pulse: 82 89 84 79  Resp: 15 18 18 16   Temp: 98.3 F (36.8 C) 98 F (36.7 C) 98.1 F (36.7 C) 98.5 F (36.9 C)  TempSrc: Oral  Oral Oral  SpO2: 100% 100% 100% 98%  Weight:   41.1 kg 41.1 kg  Height:       Physical Exam General: Well developed female, alert and in NAD Heart: RRR, no murmurs, rubs or gallops Lungs: CTA bilaterally without wheezing, rhonchi or rales Abdomen: Soft, non-tender, non-distended, +BS Extremities:No edema b/l lower extremities Dialysis Access:  PD cath in RLQ, no erythema/drainage, no TTP  Additional Objective Labs: Basic Metabolic Panel: Recent Labs  Lab 07/18/20 0455 07/19/20 0451 07/20/20 0447  NA 127* 126* 125*  K 3.7 3.6 3.8  CL 93* 92* 92*  CO2 24 23 18*  GLUCOSE 86 80 80  BUN 18 17 17   CREATININE 4.13* 4.06* 3.88*  CALCIUM 7.5* 7.5* 7.4*  PHOS 3.6 3.4 3.5   Liver Function Tests: Recent Labs  Lab 07/18/20 0455 07/19/20 0451 07/20/20 0447  ALBUMIN 1.5* 1.5* 1.4*   CBC: Recent Labs  Lab 07/14/20 0453 07/18/20 0455  WBC 7.1 6.5  NEUTROABS 4.6  --   HGB 10.4* 9.7*  HCT 30.6* 28.5*  MCV 95.9 96.6  PLT 252 215   Blood Culture    Component Value Date/Time   SDES BLOOD BLOOD RIGHT HAND 05/29/2020 2043   SPECREQUEST  05/29/2020 2043    BOTTLES DRAWN AEROBIC AND ANAEROBIC Blood Culture adequate volume   CULT  05/29/2020 2043    NO GROWTH 5 DAYS Performed at [facility], [address], [place], [place] [address]    REPTSTATUS 06/03/2020 FINAL 05/29/2020 2043   Medications: . dialysis solution 1.5% low-MG/low-CA    . dialysis solution 2.5% low-MG/low-CA     . aspirin  81 mg Oral Daily  . atorvastatin  80 mg Oral q1800  . Chlorhexidine Gluconate Cloth  6 each  Topical Daily  . cinacalcet  30 mg Oral Q breakfast  . clopidogrel  75 mg Oral Daily  . darbepoetin (ARANESP) injection - NON-DIALYSIS  100 mcg Subcutaneous Q Sat-1800  . feeding supplement  1 Container Oral TID BM  . gentamicin cream  1 application Topical Daily  . levETIRAcetam  500 mg Oral BID  . melatonin  3 mg Oral QHS  . metoprolol succinate  25 mg Oral Daily  . multivitamin  1 tablet Oral QHS  . pantoprazole  40 mg Oral Daily  . potassium chloride  10 mEq Oral TID  . saccharomyces boulardii  250 mg Oral BID  . torsemide  100 mg Oral BID    Dialysis Orders: [place] followed by Dr. Candiss Norse. 4 exchanges overnight, 2000 cc and 1.5 hrs each.   Assessment/Plan:  1. Multivessel CAD with reduced EF 25/30%: Eval by TCTS, is"poor CABG candidate". s/pPCI to LAD with DESon 2/25.gettingPlavix and ASA 81 mg,+ statin 2. Debility: 2/2 Multiple med issues mainly CAD>now on CIR 3. ESRD - Continue [name]. Stable.  Using mix of 1.5 and 2.5 tonight.   4. Hypokalemia: potassium increased to 58mEq TID, potassium level now at goal.  5. HTN/volume -BPin goal,on metoprolol 25 mg daily.  6. Anemia -  Hgb 9- 11 range. s/p 1unitPRBCon 2/20 and on 2/27. Continue Aranesp 100 MCG q Saturday. tsat 91%,ferritin 1131. No need for iron at this time. 7. Secondary hyperparathyroidism: Phos at goal, no binders. On Sensipar 30mg  daily,stoppedRocaltrol withelevatedcorrectedcalcium>10, calcium now controlled. Phosphorus level at goal.  8. Hyponatremia -Chronic but worsening, will attempt higher UF with some 2.5% exchanges tonight, see above.  9. Nutrition: Albumin low 1.7. On regular diet with prot supp.  10. Seizures - do to electrolyte abnormality in setting of C diff. Now on Keppra per neuro. No breakthrough seizures since admission to CIR  11. C diff - finished ABX course on 2/27. 12. Disposition-CIR admit for rehab 13. Nausea-unclear cause, no evidence of  peritonitis. Corrected electrolyte abnormalities as above and has compazine PRN ordered.     Madelon Lips MD 07/20/2020, 10:47 AM  [facility] Pager: [phone]

## 2020-07-21 LAB — RENAL FUNCTION PANEL
Albumin: 1.5 g/dL — ABNORMAL LOW (ref 3.5–5.0)
Anion gap: 11 (ref 5–15)
BUN: 17 mg/dL (ref 8–23)
CO2: 22 mmol/L (ref 22–32)
Calcium: 7.8 mg/dL — ABNORMAL LOW (ref 8.9–10.3)
Chloride: 94 mmol/L — ABNORMAL LOW (ref 98–111)
Creatinine, Ser: 4 mg/dL — ABNORMAL HIGH (ref 0.44–1.00)
GFR, Estimated: 11 mL/min — ABNORMAL LOW (ref >60)
Glucose, Bld: 89 mg/dL (ref 70–99)
Phosphorus: 3.3 mg/dL (ref 2.5–4.6)
Potassium: 3.8 mmol/L (ref 3.5–5.1)
Sodium: 127 mmol/L — ABNORMAL LOW (ref 135–145)

## 2020-07-21 MED ORDER — DELFLEX-LC/1.5% DEXTROSE 344 MOSM/L IP SOLN: INTRAPERITONEAL | Status: AC

## 2020-07-21 MED ORDER — DELFLEX-LC/2.5% DEXTROSE 394 MOSM/L IP SOLN: INTRAPERITONEAL | Status: AC

## 2020-07-21 NOTE — Progress Notes (Signed)
PROGRESS NOTE   Subjective/Complaints: Patient seen sitting up in bed this morning.  She states she slept fairly overnight, which is her baseline.  Nursing notes nausea and vomiting after she had a salad yesterday for lunch, stable thereafter  ROS: Denies CP, SOB, N/V/D  Objective:   No results found. No results for input(s): WBC, HGB, HCT, PLT in the last 72 hours. Recent Labs    07/20/20 0447 07/21/20 0619  NA 125* 127*  K 3.8 3.8  CL 92* 94*  CO2 18* 22  GLUCOSE 80 89  BUN 17 17  CREATININE 3.88* 4.00*  CALCIUM 7.4* 7.8*    Intake/Output Summary (Last 24 hours) at 07/21/2020 1017 Last data filed at 07/21/2020 1008 Gross per 24 hour  Intake 560 ml  Output 1201 ml  Net -641 ml        Physical Exam: Vital Signs Blood pressure (!) 112/56, pulse 69, temperature 98.1 F (36.7 C), temperature source Oral, resp. rate 16, height 5\' 4"  (1.626 m), weight 43 kg, SpO2 98 %. Constitutional: No distress . Vital signs reviewed. HENT: Normocephalic.  Atraumatic. Eyes: EOMI. No discharge. Cardiovascular: No JVD.  RRR. Respiratory: Normal effort.  No stridor.  Bilateral clear to auscultation. GI: Non-distended.  BS +.  + PD site. Skin: Warm and dry.  Intact. Psych: Normal mood.  Normal behavior. Musc: No edema in extremities.  No tenderness in extremities. Neurologic: Alert and oriented.  Motor: 4-4+/5 throughout, unchanged  Assessment/Plan: 1. Functional deficits which require 3+ hours per day of interdisciplinary therapy in a comprehensive inpatient rehab setting.  Physiatrist is providing close team supervision and 24 hour management of active medical problems listed below.  Physiatrist and rehab team continue to assess barriers to discharge/monitor patient progress toward functional and medical goals  Care Tool:  Bathing    Body parts bathed by patient: Right arm,Left arm,Chest,Abdomen,Front perineal area,Right  upper leg,Left upper leg,Face,Right lower leg,Left lower leg,Buttocks   Body parts bathed by helper: Buttocks     Bathing assist Assist Level: Supervision/Verbal cueing     Upper Body Dressing/Undressing Upper body dressing   What is the patient wearing?: Pull over shirt    Upper body assist Assist Level: Set up assist    Lower Body Dressing/Undressing Lower body dressing      What is the patient wearing?: Underwear/pull up,Pants     Lower body assist Assist for lower body dressing: Supervision/Verbal cueing     Toileting Toileting    Toileting assist Assist for toileting: Minimal Assistance - Patient > 75%     Transfers Chair/bed transfer  Transfers assist     Chair/bed transfer assist level: Contact Guard/Touching assist     Locomotion Ambulation   Ambulation assist      Assist level: Contact Guard/Touching assist Assistive device: Walker-rolling Max distance: 40'   Walk 10 feet activity   Assist  Walk 10 feet activity did not occur: Safety/medical concerns  Assist level: Contact Guard/Touching assist Assistive device: Walker-rolling   Walk 50 feet activity   Assist Walk 50 feet with 2 turns activity did not occur: Safety/medical concerns  Assist level: Contact Guard/Touching assist Assistive device: Walker-rolling    Walk  150 feet activity   Assist Walk 150 feet activity did not occur: Safety/medical concerns  Assist level: Contact Guard/Touching assist Assistive device: Walker-rolling    Walk 10 feet on uneven surface  activity   Assist Walk 10 feet on uneven surfaces activity did not occur: Safety/medical concerns         Wheelchair     Assist Will patient use wheelchair at discharge?: Yes Type of Wheelchair: Manual Wheelchair activity did not occur: Safety/medical concerns  Wheelchair assist level: Minimal Assistance - Patient > 75% Max wheelchair distance: 120'    Wheelchair 50 feet with 2 turns  activity    Assist    Wheelchair 50 feet with 2 turns activity did not occur: Safety/medical concerns   Assist Level: Minimal Assistance - Patient > 75%   Wheelchair 150 feet activity     Assist  Wheelchair 150 feet activity did not occur: Safety/medical concerns       Medical Problem List and Plan: 1.Functional and mobility deficitssecondary to debility after multiple medical issues-CHF, hx of C diff colitis, recent R hip fx 12/21 with ORIF  Continue CIR 2. Antithrombotics: -DVT/anticoagulation:Mechanical:Antiembolism stockings, knee (TED hose) Bilateral lower extremities Sequential compression devices, below kneeBilateral lower extremities -antiplatelet therapy: ASA/plavix 3.Chronic daily frequent HA/Pain Management:tylenol for basic pain -fioricet for breakthrough headaches (on this PTA)  Controlled with meds on 3/21 4. Mood:fairly positive. Team to provide ego support as needed -antipsychotic agents: n/a 5. Neuropsych: This patientiscapable of making decisions on herown behalf. 6. Skin/Wound Care:Routine pressure relief measures. 7. Fluids/Electrolytes/Nutrition:Monitor I/Os.  8. ESRD: Continue daily PD, dialysis doing well at this point. 9. CAD s/p PCI: On ASA/Plavix, metoprolol and lipitor. Monitor for symptoms with increase in activity.  10. New onset seizures: Continue Keppra bid Off Wellbutrin. Monitor for anxiety symptoms.  No breakthrough seizures since admission to CIR-3/21 11. H/o A flutter: Monitor HR tid--continue metoprolol. 12. Anemia of chronic disease: Monitor with serial H/H--improved post transfusion.  --On Aranesp weekly  Hemoglobin 9.7 on 3/18, labs ordered for tomorrow  Hemoccult negative   Continue to monitor 13. C diff colitis: Completed treatment with Dificid on 02/27 -had one watery stool 3/18 followed by small and then smear mushy stool.  Don't think we need to check stool for c diff.    Added probiotic 14. Chronic combined CHF: Takatsubo CM with EF--25-30% -- Monitor weights daily.  --On demadex bid with Kdur for supplement as well as metoprolol and statin. Filed Weights   07/20/20 0426 07/20/20 0725 07/20/20 1655  Weight: 41.1 kg 41.1 kg 43 kg   ?  Reliability on 3/20 15.  Nausea, recent C. difficile colitis, end-stage renal disease, gastroparesis  Suspect this is chronic given PD, electrolyte abnormalities, confirmed by daughter.  However, patient states this is new.  KUB ordered, showing oral contrast throughout the colon, otherwise relatively unremarkable  Continue Reglan 5 BID, repeat ECG showing persistent prolonged QTC, DCed Reglan given improvement, will consider restarting necessary  Continue PPI-history of ulcer  -prn compazine  16.  Sleep disturbance   Continue melatonin scheduled and ambien prn  -some improvement 17.  Hypoalbuminemia  Supplement initiated 18.  Prolonged QTC  See #15  Zofran DC'd--patient states ineffective anyway. 19.  Hypokalemia  Potassium 3.8 3/20  Continue per renal recs  LOS: 17 days A FACE TO FACE EVALUATION WAS PERFORMED  Ankit Lorie Phenix 07/21/2020, 10:17 AM

## 2020-07-21 NOTE — Progress Notes (Signed)
Subjective: Said tolerated HD earlier today ,just finished vomiting she attributes this to" large vitamin given and multiple meds before breakfast on empty stomach, never takes meds on empty stomach at home"  Objective Vital signs in last 24 hours: Vitals:   07/20/20 1655 07/20/20 2029 07/21/20 0356 07/21/20 0956  BP: 104/61 98/78 115/71 (!) 112/56  Pulse: 84 90 89 69  Resp: 16 17 18 16   Temp: 97.6 F (36.4 C) 98.9 F (37.2 C) 98.3 F (36.8 C) 98.1 F (36.7 C)  TempSrc: Oral   Oral  SpO2:  99% 98%   Weight: 43 kg     Height:       Weight change: 0 kg  Physical Exam: General: Alert elderly female, pleasant despite recently, NAD Heart: RRR no MRG Lungs: CTA nonlabored room air Abdomen: Positive BS, soft NTND PD site stable Extremities: PD cath in right lower quadrant nontender dressing dry Dialysis Access: No pedal edema  Dialysis Orders: [place] followed by Dr. Candiss Norse. 4 exchanges overnight, 2000 cc and 1.5 hrs each.   Assessment/Plan:  1. Multivessel CAD with reduced EF 25/30%: Eval by TCTS, is"poor CABG candidate". s/pPCI to LAD with DESon 2/25.gettingPlavix and ASA 81 mg,+ statin 2. Debility: 2/2 Multiple med issues mainly CAD>now on CIR, noted "may be discharged Wednesday 3/23" 3. ESRD - Continue [name]. Stable.  Using mix of 1.5 and 2.5 tonight.   4. Hypokalemia: Last K3.8, potassium increased to 28mEq TID, potassium level now at goal. 5. HTN/volume -BPin goal,on metoprolol 25 mg daily.  6. Anemia of ESRD-Hgb 9- 11 range. s/p 1unitPRBCon 2/20 and on 2/27. Continue Aranesp 100 MCG q Saturday. tsat 91%,ferritin 1131. No need for iron at this time. 7. Secondary hyperparathyroidism: Phos at goal, no binders. On Sensipar 30mg  daily,stoppedRocaltrol withelevatedcorrectedcalcium>10, calcium now controlled. Phosphorus level at goal. 8. Hyponatremia -NA 127 chronic but worsening, will attempt higher UF with some 2.5% exchanges  tonight, see above.  9. Nutrition: Albumin low 1.5. On regular diet with prot supp.  10. Seizures - do to electrolyte abnormality in setting of C diff. Now on Keppra per neuro. No breakthrough seizures since admission to CIR  11. C diff - finished ABX course on 2/27. 12. Disposition-CIR admit for rehab 13. Nausea-unclear cause, probable medication issue on empty stomach this a.m. per patient perspective, no evidence of peritonitis. Corrected electrolyte abnormalities as above and has compazine PRN ordered  Ernest Haber, PA-C [facility] Beeper [phone] 07/21/2020,1:11 PM  LOS: 17 days   Labs: Basic Metabolic Panel: Recent Labs  Lab 07/19/20 0451 07/20/20 0447 07/21/20 0619  NA 126* 125* 127*  K 3.6 3.8 3.8  CL 92* 92* 94*  CO2 23 18* 22  GLUCOSE 80 80 89  BUN 17 17 17   CREATININE 4.06* 3.88* 4.00*  CALCIUM 7.5* 7.4* 7.8*  PHOS 3.4 3.5 3.3   Liver Function Tests: Recent Labs  Lab 07/19/20 0451 07/20/20 0447 07/21/20 0619  ALBUMIN 1.5* 1.4* 1.5*   No results for input(s): LIPASE, AMYLASE in the last 168 hours. No results for input(s): AMMONIA in the last 168 hours. CBC: Recent Labs  Lab 07/18/20 0455  WBC 6.5  HGB 9.7*  HCT 28.5*  MCV 96.6  PLT 215   Cardiac Enzymes: No results for input(s): CKTOTAL, CKMB, CKMBINDEX, TROPONINI in the last 168 hours. CBG: No results for input(s): GLUCAP in the last 168 hours.  Studies/Results: No results found. Medications:  . (feeding supplement) PROSource Plus  30 mL Oral BID BM  .  aspirin  81 mg Oral Daily  . atorvastatin  80 mg Oral q1800  . Chlorhexidine Gluconate Cloth  6 each Topical Daily  . cinacalcet  30 mg Oral Q breakfast  . clopidogrel  75 mg Oral Daily  . darbepoetin (ARANESP) injection - NON-DIALYSIS  100 mcg Subcutaneous Q Sat-1800  . feeding supplement  1 Container Oral TID BM  . feeding supplement (NEPRO CARB STEADY)  237 mL Oral TID WC  . gentamicin cream  1 application Topical  Daily  . levETIRAcetam  500 mg Oral BID  . melatonin  3 mg Oral QHS  . metoprolol succinate  25 mg Oral Daily  . multivitamin  1 tablet Oral QHS  . pantoprazole  40 mg Oral Daily  . potassium chloride  10 mEq Oral TID  . saccharomyces boulardii  250 mg Oral BID  . torsemide  100 mg Oral BID

## 2020-07-21 NOTE — Progress Notes (Signed)
Occupational Therapy Session Note  Patient Details  Name: Amy [name] MRN: [id number] Date of Birth: Jul 27, 1950  Today's Date: 07/21/2020 OT Individual Time: 3958-4417 OT Individual Time Calculation (min): 58 min    Short Term Goals: Week 2:  OT Short Term Goal 1 (Week 2): patient will complete bathing/dressing tasks edge of bed with set up/CS OT Short Term Goal 2 (Week 2): patient will complete functional transfers CS with RW OT Short Term Goal 3 (Week 2): patient will complete basic reach and transport of items with RW CS with good carryover of strategies  Skilled Therapeutic Interventions/Progress Updates:    patient in bed, alert and ready for therapy session.   She notes mild HA, denies nausea at this time.  Able to donn slipper socks with set up, bed mobility mod I.  Sit to stand and ambulation with RW to/from bed, toilet and w/c with CS.  toileting completed with CS/set up.  Grooming, Bathing and dressing w/c level at sink.  Assist to wash hair with shampoo tray, otherwise CS/set up level and increased time.  Completed ambulation with RW approx 50 feet with CS.  Returned to bed at close of session with CS.  She notes mild fatigue at close of session, bed alarm set and call bell /tray table in reach.    Therapy Documentation Precautions:  Precautions Precautions: Fall Precaution Comments: generalized weakness, peritoneal diaylisis, seizures, monitor O2 during ambulation Restrictions Weight Bearing Restrictions: No  Therapy/Group: Individual Therapy  Carlos Levering 07/21/2020, 7:33 AM

## 2020-07-21 NOTE — Progress Notes (Signed)
Physical Therapy Session Note  Patient Details  Name: Amy [name] MRN: [id number] Date of Birth: 06-Apr-1951  Today's Date: 07/21/2020 PT Individual Time: 5749-3552 PT Individual Time Calculation (min): 39 min   Short Term Goals: Week 2:  PT Short Term Goal 1 (Week 2): Patient will ambulate 120' consistently with CGA with RW. PT Short Term Goal 1 - Progress (Week 2): Met PT Short Term Goal 2 (Week 2): Patient will demonstrate standing dynamic balance with CGA using 1 hand support for functional activities. PT Short Term Goal 2 - Progress (Week 2): Met PT Short Term Goal 3 (Week 2): Patient will negotiate 4 steps with rail and CGA. PT Short Term Goal 3 - Progress (Week 2): Met PT Short Term Goal 4 (Week 2): Patient to tolerate standing activity for 10 minutes without seated rest. PT Short Term Goal 4 - Progress (Week 2): Met  Skilled Therapeutic Interventions/Progress Updates:    Patient in supine and reports still not feeling well, but kept down nausea medication and willing to try in bed therex.  Patient performed 2 x 10 reps of SLR, bridging, hooklying marching, lateral trunk rotation, hooklying hip abduction with orange t-band, SAQ w/ 5 sec hold and bicep curls and chest press with water bottle for resistance.  Patient requesting to toilet and performed supine to sit with CGA after twice attempting with S.  Patient sit to stand to RW S, ambulated to bathroom with CGA for safety over lip of entrance.  Patient toileted with CGA for balance with clothing management.  Ambulated to sink standing to wash hands with S.  Sit to supine with S.  Left supine in bed with call bell and needs in reach.  Therapy Documentation Precautions:  Precautions Precautions: Fall Precaution Comments: generalized weakness, peritoneal diaylisis, seizures, monitor O2 during ambulation Restrictions Weight Bearing Restrictions: No General: PT Amount of Missed Time (min): 75 Minutes PT Missed Treatment Reason:  Patient ill (Comment) (nausea and vomiting)   Therapy/Group: Individual Therapy  Reginia Naas  [place], [place] 07/21/2020, 4:48 PM

## 2020-07-21 NOTE — Progress Notes (Signed)
Physical Therapy Session Note  Patient Details  Name: Amy [name] MRN: [id number] Date of Birth: 1951/03/31  Today's Date: 07/21/2020 PT Missed Time: 71 Minutes Missed Time Reason: Patient ill (Comment)  Pt received supine in bed reporting she has not been feeling well today with nausea/vomitting and requests to attempt to eat dinner at this time in hopes this will improve her symptoms. Pt politely declines therapy - left supine in bed with needs in reach, bed alarm on, and meal tray set-up. Missed 30 minutes of skilled physical therapy.  Therapy Documentation Precautions:  Precautions Precautions: Fall Precaution Comments: generalized weakness, peritoneal diaylisis, seizures, monitor O2 during ambulation Restrictions Weight Bearing Restrictions: No  Tawana Scale , PT, DPT, CSRS  07/21/2020, 5:51 PM

## 2020-07-21 NOTE — Progress Notes (Signed)
Physical Therapy Note  Patient Details  Name: Amy [name] MRN: [id number] Date of Birth: Jun 11, 1950 Today's Date: 07/21/2020    Patient c/o nausea and vomiting and refused PT missing 75 minutes of skilled PT.   Reginia Naas  [place], [place] 07/21/2020, 4:47 PM

## 2020-07-22 LAB — CBC WITH DIFFERENTIAL/PLATELET
Abs Immature Granulocytes: 0.04 10*3/uL (ref 0.00–0.07)
Basophils Absolute: 0.1 10*3/uL (ref 0.0–0.1)
Basophils Relative: 1 %
Eosinophils Absolute: 0.4 10*3/uL (ref 0.0–0.5)
Eosinophils Relative: 4 %
HCT: 37.4 % (ref 36.0–46.0)
Hemoglobin: 12.8 g/dL (ref 12.0–15.0)
Immature Granulocytes: 0 %
Lymphocytes Relative: 11 %
Lymphs Abs: 1.1 10*3/uL (ref 0.7–4.0)
MCH: 32.9 pg (ref 26.0–34.0)
MCHC: 34.2 g/dL (ref 30.0–36.0)
MCV: 96.1 fL (ref 80.0–100.0)
Monocytes Absolute: 0.8 10*3/uL (ref 0.1–1.0)
Monocytes Relative: 8 %
Neutro Abs: 7.6 10*3/uL (ref 1.7–7.7)
Neutrophils Relative %: 76 %
Platelets: 296 10*3/uL (ref 150–400)
RBC: 3.89 MIL/uL (ref 3.87–5.11)
RDW: 20.4 % — ABNORMAL HIGH (ref 11.5–15.5)
WBC: 10 10*3/uL (ref 4.0–10.5)
nRBC: 0 % (ref 0.0–0.2)

## 2020-07-22 LAB — RENAL FUNCTION PANEL
Albumin: 1.6 g/dL — ABNORMAL LOW (ref 3.5–5.0)
Anion gap: 11 (ref 5–15)
BUN: 22 mg/dL (ref 8–23)
CO2: 24 mmol/L (ref 22–32)
Calcium: 7.9 mg/dL — ABNORMAL LOW (ref 8.9–10.3)
Chloride: 93 mmol/L — ABNORMAL LOW (ref 98–111)
Creatinine, Ser: 3.82 mg/dL — ABNORMAL HIGH (ref 0.44–1.00)
GFR, Estimated: 12 mL/min — ABNORMAL LOW (ref >60)
Glucose, Bld: 83 mg/dL (ref 70–99)
Phosphorus: 3.5 mg/dL (ref 2.5–4.6)
Potassium: 4.3 mmol/L (ref 3.5–5.1)
Sodium: 128 mmol/L — ABNORMAL LOW (ref 135–145)

## 2020-07-22 MED ORDER — PROCHLORPERAZINE MALEATE 5 MG PO TABS
10.0000 mg | ORAL_TABLET | Freq: Once | ORAL | Status: AC
  Administered 2020-07-22: 10 mg via ORAL

## 2020-07-22 MED ORDER — PROCHLORPERAZINE EDISYLATE 10 MG/2ML IJ SOLN: 10.0000 mg | Freq: Once | INTRAMUSCULAR | Status: DC

## 2020-07-22 NOTE — Progress Notes (Signed)
Occupational Therapy Discharge Summary  Patient Details  Name: Amy [name] MRN: [id number] Date of Birth: 1950/06/21     Patient has met 10 of 10 long term goals due to improved activity tolerance, improved balance, postural control and improved awareness.  Patient to discharge at overall Supervision level.  Patient's care partner is independent to provide the necessary physical assistance at discharge.    Reasons goals not met: na  Recommendation:  Patient will benefit from ongoing skilled OT services in home health setting to continue to advance functional skills in the area of BADL, iADL and Reduce care partner burden.  Equipment: No equipment provided  Reasons for discharge: treatment goals met  Patient/family agrees with progress made and goals achieved: Yes  OT Discharge Precautions/Restrictions  Precautions Precautions: Fall Precaution Comments: generalized weakness, peritoneal diaylisis, hx seizures, monitor O2 and BP during ambulation Restrictions Weight Bearing Restrictions: No General     ADL ADL Eating: Independent Where Assessed-Eating: Chair Grooming: Modified independent Where Assessed-Grooming: Sitting at sink Upper Body Bathing: Setup Where Assessed-Upper Body Bathing: Sitting at sink Lower Body Bathing: Supervision/safety Where Assessed-Lower Body Bathing: Sitting at sink,Standing at sink Upper Body Dressing: Setup Where Assessed-Upper Body Dressing: Chair Lower Body Dressing: Supervision/safety Where Assessed-Lower Body Dressing: Chair Toileting: Supervision/safety Where Assessed-Toileting: Glass blower/designer: Close supervision Armed forces technical officer Method: Counselling psychologist: Raised toilet seat,Grab bars Tub/Shower Transfer: Minimal Museum/gallery conservator Method: Recruitment consultant with back,Grab bars,Walk in shower ADL Comments: patient plans to sponge bathe Vision Baseline  Vision/History: No visual deficits Patient Visual Report: No change from baseline Vision Assessment?: No apparent visual deficits Perception  Perception: Within Functional Limits Praxis Praxis: Intact Cognition Overall Cognitive Status: Within Functional Limits for tasks assessed Arousal/Alertness: Awake/alert Orientation Level: Oriented X4 Safety/Judgment: Appears intact Sensation Sensation Light Touch: Appears Intact Hot/Cold: Appears Intact Proprioception: Appears Intact Stereognosis: Appears Intact Coordination Gross Motor Movements are Fluid and Coordinated: Yes Fine Motor Movements are Fluid and Coordinated: Yes Finger Nose Finger Test: [facility] Motor  Motor Motor: Within Functional Limits Motor - Discharge Observations: occ reminders needed for energy conservation, monitor BP Mobility  Bed Mobility Rolling Right: Independent Rolling Left: Independent Supine to Sit: Independent Sit to Supine: Independent Transfers Sit to Stand: Supervision/Verbal cueing Stand to Sit: Supervision/Verbal cueing  Trunk/Postural Assessment  Postural Control Postural Control: Within Functional Limits  Balance Static Sitting Balance Static Sitting - Level of Assistance: 7: Independent Dynamic Sitting Balance Dynamic Sitting - Level of Assistance: 7: Independent Static Standing Balance Static Standing - Level of Assistance: 5: Stand by assistance Dynamic Standing Balance Dynamic Standing - Level of Assistance: 5: Stand by assistance Extremity/Trunk Assessment RUE Assessment RUE Assessment: Within Functional Limits LUE Assessment LUE Assessment: Within Functional Limits   Amy [name] A Amy [name] 07/22/2020, 12:12 PM

## 2020-07-22 NOTE — Progress Notes (Signed)
Physical Therapy Session Note  Patient Details  Name: Amy [name] MRN: [id number] Date of Birth: Nov 01, 1950  Today's Date: 07/22/2020 PT Individual Time: 4132-4401 PT Individual Time Calculation (min): 31 min   Short Term Goals: Week 3:  PT Short Term Goal 1 (Week 3): STG=LTG due to ELOS  Skilled Therapeutic Interventions/Progress Updates:    Pt received supine in bed reporting she has "not had a good past 2 days" stating "it started yesterday morning" when she "took vitamins on an empty stomach" causing nausea and vomiting. Despite this and currently feeling nauseous, pt agreeable to in room therapy session. Pt already with B LE knee high ACE wraps for compression and BP management. Assessed vitals as below. Supine>sitting R EOB, pt initially reaching out for HHA but with encouragement and cuing on sequencing able to complete independently using bedrails. Sit<>stand to/from RW with close supervision for safety. Gait training on/off 1 4" height step using RW with education/cuing on sequencing of AD management and LE stepping - able to complete with CGA for safety and pt managing AD without assist. Gait ~21ft back to bed using RW with close supervision for safety. Sit>supine independently using bedrails. Doffed B LE ACE wraps.Pt left supine in bed with needs in reach and bed alarm on.   Vitals:  Supine: BP 100/64 (MAP 77), HR 90bpm Sitting: BP 95/68 (MAP 76), HR 99bpm Standing: BP 87/65 (MAP 74), HR 106bpm - pt reporting increased nausea feeling After gait: BP 99/70 (MAP 80), HR 108bpm  Therapy Documentation Precautions:  Precautions Precautions: Fall Precaution Comments: generalized weakness, peritoneal diaylisis, hx seizures Restrictions Weight Bearing Restrictions: No  Pain: No reports of pain, mostly nauseous, throughout session.   Therapy/Group: Individual Therapy  Tawana Scale , PT, DPT, CSRS  07/22/2020, 3:22 PM

## 2020-07-22 NOTE — Progress Notes (Signed)
Occupational Therapy Session Note  Patient Details  Name: Amy [name] MRN: [id number] Date of Birth: [birth year]  Today's Date: 07/22/2020 OT Individual Time: 1100-1155 OT Individual Time Calculation (min): 55 min    Short Term Goals: Week 2:  OT Short Term Goal 1 (Week 2): patient will complete bathing/dressing tasks edge of bed with set up/CS OT Short Term Goal 2 (Week 2): patient will complete functional transfers CS with RW OT Short Term Goal 3 (Week 2): patient will complete basic reach and transport of items with RW CS with good carryover of strategies  Skilled Therapeutic Interventions/Progress Updates:    Patient in bed, alert and denies pain.  She states that she continues to have nausea.  She is able to drink this am but has not eaten her breakfast.  Supine to sitting edge of bed with CS.  Sit to stand and SPT/ambulation with RW household distances with CS.  adl tasks performed seated edge of bed and w/c level at sink with CS/set up.  Reviewed progress to date and recommendation for supervision at home.  Reviewed energy conservation and set up of home environment - she demonstrates good understanding.  She returned to bed at close of session as she hopes to be able to complete full PT sessions this afternoon.  Bed alarm set and call bell in hand.  She states that she feels prepared for discharge and is excited to go home tomorrow.    Therapy Documentation Precautions:  Precautions Precautions: Fall Precaution Comments: generalized weakness, peritoneal diaylisis, seizures, monitor O2 during ambulation Restrictions Weight Bearing Restrictions: No  Therapy/Group: Individual Therapy  Carlos Levering 07/22/2020, 7:31 AM

## 2020-07-22 NOTE — Progress Notes (Addendum)
Inpatient Rehabilitation Care Coordinator Discharge Note  The overall goal for the admission was met for:   Discharge location: [place] [facility]  Length of Stay: Yes-20 DAYS  Discharge activity level: Yes-SUPERVISION LEVEL FOR SAFETY  Home/community participation: Yes  Services provided included: MD, RD, PT, OT, RN, CM, Pharmacy, Neuropsych and SW  Financial Services: Medicare and Private Insurance: Public relations account executive offered to/list presented to:YES  Follow-up services arranged: Home Health: [place] and Patient/Family request agency HH: ACTIVE PATIENT, DME: HAS ALL NEEDED EQUIPMENT FROM PAST SURGERIES  Comments (or additional information):HUSBAND WAS HERE FOR EDUCATION AND AWARE OF 24/7 Happy.   Patient/Family verbalized understanding of follow-up arrangements: Yes  Individual responsible for coordination of the follow-up plan: [facility] [phone]  Confirmed correct DME delivered: [name] 07/22/2020    Jhonatan Lomeli, [place]

## 2020-07-22 NOTE — Progress Notes (Signed)
Occupational Therapy Session Note  Patient Details  Name: Amy [name] MRN: [id number] Date of Birth: [birth year]  Today's Date: 07/22/2020 OT Individual Time: 1009-1037 OT Individual Time Calculation (min): 28 min    Short Term Goals: Week 2:  OT Short Term Goal 1 (Week 2): patient will complete bathing/dressing tasks edge of bed with set up/CS OT Short Term Goal 2 (Week 2): patient will complete functional transfers CS with RW OT Short Term Goal 3 (Week 2): patient will complete basic reach and transport of items with RW CS with good carryover of strategies  Skilled Therapeutic Interventions/Progress Updates:    Pt in bed to start session with reports of being nauseated today and yesterday.  She was in agreement to try with therapy however with transfer to the EOB with supervision.  She was able to complete donning her pants with supervision with completion of toilet transfer at the same level with use of the RW for support.  She was able to have a successful small BM and then completed her toilet hygiene as well as clothing management at supervision level as well.  She ambulated to the sink for washing her hands and then requested transfer back to the bed to rest.  Call button and phone in reach at end of session.    Therapy Documentation Precautions:  Precautions Precautions: Fall Precaution Comments: generalized weakness, peritoneal diaylisis, hx seizures, monitor O2 and BP during ambulation Restrictions Weight Bearing Restrictions: No   Pain: Pain Assessment Pain Scale: 0-10 Pain Score: 7  Pain Type: Chronic pain Pain Location: Head Pain Descriptors / Indicators: Headache Pain Frequency: Intermittent Pain Onset: On-going Patients Stated Pain Goal: 2 Pain Intervention(s): Medication (See eMAR) (Fiorecet given) ADL: See Care Tool Section for some details of mobility and selfcare  Therapy/Group: Individual Therapy  Kadija Cruzen OTR/L 07/22/2020, 12:24 PM

## 2020-07-22 NOTE — Progress Notes (Signed)
Renal Navigator received call from PD RN/Andrea from [facility] in [place] who asked about patient's admission and discharge dates from acute hospitalization and inpatient rehab. Navigator provided dates and informed her that patient is scheduled for discharge home with 24/7 care tomorrow, 07/23/20. RN states she has been calling patient and husband with no answer and no call back. She states patient needs to come in to make contact with the clinic, as she has not been there since December, 2021. She understands that some of this time has been due to hospitalization and rehab, but patient needs to make contact with her clinic and come in Friday at 11:00am to re-establish care. Navigator passed this along to CIR CSW/B. Dupree, who states she will talk to husband about.  Navigator faxed hospital H&P from 2/17, discharge from 2/28 and CIR H&P from 3/4 to Home Therapy clinic to provide continuity of care. Navigator asked RN to call if there is anything else that is needed.   Alphonzo Cruise, [place] Renal Navigator [phone]

## 2020-07-22 NOTE — Progress Notes (Signed)
Pt completed PD. UF removal 1222. Denies complaints. No s/s of infection.

## 2020-07-22 NOTE — Progress Notes (Signed)
Physical Therapy Discharge Summary  Patient Details  Name: Amy [name] MRN: [id number] Date of Birth: 09-Jan-1951   Patient has met 9 of 9 long term goals due to improved activity tolerance, improved balance and increased strength.  Patient to discharge at an ambulatory level Supervision.   Patient's care partner is independent to provide the necessary physical assistance at discharge.  Reasons goals not met: PT goals met, though some fluctuating levels due to feeling ill with N&V just prior to d/c.   Recommendation:  Patient will benefit from ongoing skilled PT services in home health setting to continue to advance safe functional mobility, address ongoing impairments in activity tolerance, balance and strength, and minimize fall risk.  Equipment: No equipment provided  Reasons for discharge: discharge from hospital  Patient/family agrees with progress made and goals achieved: Yes  PT Discharge Precautions/Restrictions Precautions Precautions: Fall Precaution Comments: generalized weakness, peritoneal diaylisis, hx seizures Restrictions Weight Bearing Restrictions: No Vital Signs   Pain Pain Assessment Pain Scale: 0-10 Pain Score: 7  Pain Type: Chronic pain Pain Location: Head Pain Descriptors / Indicators: Headache Pain Frequency: Intermittent Pain Onset: On-going Patients Stated Pain Goal: 2 Pain Intervention(s): Medication (See eMAR) (Fiorecet given) Vision/Perception  Perception Perception: Within Functional Limits Praxis Praxis: Intact  Cognition Overall Cognitive Status: Within Functional Limits for tasks assessed Arousal/Alertness: Awake/alert Orientation Level: Oriented X4 Safety/Judgment: Appears intact Sensation Sensation Light Touch: Appears Intact Hot/Cold: Appears Intact Proprioception: Appears Intact Stereognosis: Appears Intact Coordination Gross Motor Movements are Fluid and Coordinated: Yes Fine Motor Movements are Fluid and Coordinated:  Yes Finger Nose Finger Test: [facility] Motor  Motor Motor: Within Functional Limits Motor - Discharge Observations: occ reminders needed for energy conservation, monitor BP  Mobility Bed Mobility Rolling Right: Independent Rolling Left: Independent Supine to Sit: Contact Guard/Touching assist Sit to Supine: Independent Transfers Sit to Stand: Supervision/Verbal cueing Stand to Sit: Supervision/Verbal cueing Stand Pivot Transfers: Supervision/Verbal cueing Transfer (Assistive device): Rolling walker Locomotion  Gait Ambulation: Yes  Trunk/Postural Assessment  Postural Control Postural Control: Within Functional Limits  Balance Static Sitting Balance Static Sitting - Level of Assistance: 7: Independent Dynamic Sitting Balance Dynamic Sitting - Level of Assistance: 7: Independent Static Standing Balance Static Standing - Level of Assistance: 5: Stand by assistance Dynamic Standing Balance Dynamic Standing - Level of Assistance: 5: Stand by assistance Extremity Assessment  RUE Assessment RUE Assessment: Within Functional Limits LUE Assessment LUE Assessment: Within Functional Limits RLE Assessment RLE Assessment: Exceptions to [facility] Active Range of Motion (AROM) Comments: [facility] General Strength Comments: Hip flexion 3/5, knee flexion 4-/5, extension 4/5, ankle DF 4/5 LLE Assessment LLE Assessment: Exceptions to [facility] Active Range of Motion (AROM) Comments: [facility] General Strength Comments: Hip flexion 3+/5, knee flexion 4-/5, knee extension 4+/5, ankle DF 4+5    Elray Mcgregor  [place], PT 07/22/2020, 2:24 PM

## 2020-07-22 NOTE — Progress Notes (Signed)
Subjective: Tolerated PD last night, 1.2 L UF, some nausea this a.m. improved from yesterday.  Objective Vital signs in last 24 hours: Vitals:   07/22/20 0410 07/22/20 0416 07/22/20 0615 07/22/20 0630  BP:  102/70 97/65 99/63   Pulse:  89 95 95  Resp:  18 18   Temp:  98 F (36.7 C) 98.1 F (36.7 C)   TempSrc:  Oral Oral   SpO2:  99% 99%   Weight: 38 kg  38 kg   Height:       Weight change: 2.9 kg  Physical Exam: General: Alert elderly female, pleasant, NAD Heart: RRR no MRG Lungs: CTA nonlabored room air Abdomen: Positive BS, soft NTND PD site stable Extremities: PD cath in right lower quadrant nontender dressing dry Dialysis Access: No pedal edema  Dialysis Orders: [place] followed by Dr. Candiss Norse. 4 exchanges overnight, 2000 cc and 1.5 hrs each.   Assessment/Plan:  1. Multivessel CAD with reduced EF 25/30%: Eval by TCTS, is"poor CABG candidate". s/pPCI to LAD with DESon 2/25.gettingPlavix and ASA 81 mg,+ statin 2. Debility: 2/2 Multiple med issues mainly CAD>now on CIR, noted "may be discharged Wednesday 3/23" 3. ESRD - Continue [name]. Stable. With hyponatremiausing mix of 1.5 and 2.5 tonight. Not having much urine output will DC torsemide 4. Hypokalemia: Last K 4.3 potassium  on 58mEq TID, potassium level now at goal. 5. HTN/volume -BPin goal,on metoprolol 25 mg daily.  6. Anemia of ESRD-Hgb 9- 11 range. s/p 1unitPRBCon 2/20 and on 2/27. Continue Aranesp 100 MCG q Saturday. tsat 91%,ferritin 1131. No need for iron at this time. 7. Secondary hyperparathyroidism: Phos at goal, no binders. On Sensipar 30mg  daily will DC with her nausea,stoppedRocaltrol withelevatedcorrectedcalcium>10, calcium now controlled. Phosphorus level at goal. 8. Hyponatremia -NA 128 chronic but worsening, will attempt higher UF with some 2.5% exchanges tonight, see above.  9. Nutrition: Albumin low 1.6. On regular diet with prot supp.  10. Seizures -  do to electrolyte abnormality in setting of C diff. Now on Keppra per neuro. No breakthrough seizures since admission to CIR  11. C diff - finished ABX course on 2/27. 12. Disposition-CIR admit for rehab 13. Nausea-unclear cause,  possible medication issue will DC Sensipar and renal vitamin, torsemide, no evidence of peritonitis. Corrected electrolyte abnormalities as above and has compazine PRN ordered   Ernest Haber, PA-C [facility] Bee[facility] [phone] 07/22/2020,11:43 AM  LOS: 18 days   Labs: Basic Metabolic Panel: Recent Labs  Lab 07/20/20 0447 07/21/20 0619 07/22/20 0540  NA 125* 127* 128*  K 3.8 3.8 4.3  CL 92* 94* 93*  CO2 18* 22 24  GLUCOSE 80 89 83  BUN 17 17 22   CREATININE 3.88* 4.00* 3.82*  CALCIUM 7.4* 7.8* 7.9*  PHOS 3.5 3.3 3.5   Liver Function Tests: Recent Labs  Lab 07/20/20 0447 07/21/20 0619 07/22/20 0540  ALBUMIN 1.4* 1.5* 1.6*   No results for input(s): LIPASE, AMYLASE in the last 168 hours. No results for input(s): AMMONIA in the last 168 hours. CBC: Recent Labs  Lab 07/18/20 0455 07/22/20 0540  WBC 6.5 10.0  NEUTROABS  --  7.6  HGB 9.7* 12.8  HCT 28.5* 37.4  MCV 96.6 96.1  PLT 215 296   Cardiac Enzymes: No results for input(s): CKTOTAL, CKMB, CKMBINDEX, TROPONINI in the last 168 hours. CBG: No results for input(s): GLUCAP in the last 168 hours.  Studies/Results: No results found. Medications: . dialysis solution 1.5% low-MG/low-CA    . dialysis solution  2.5% low-MG/low-CA     . (feeding supplement) PROSource Plus  30 mL Oral BID BM  . aspirin  81 mg Oral Daily  . atorvastatin  80 mg Oral q1800  . Chlorhexidine Gluconate Cloth  6 each Topical Daily  . cinacalcet  30 mg Oral Q breakfast  . clopidogrel  75 mg Oral Daily  . darbepoetin (ARANESP) injection - NON-DIALYSIS  100 mcg Subcutaneous Q Sat-1800  . feeding supplement  1 Container Oral TID BM  . feeding supplement (NEPRO CARB STEADY)  237 mL Oral TID WC  .  gentamicin cream  1 application Topical Daily  . levETIRAcetam  500 mg Oral BID  . melatonin  3 mg Oral QHS  . metoprolol succinate  25 mg Oral Daily  . multivitamin  1 tablet Oral QHS  . pantoprazole  40 mg Oral Daily  . potassium chloride  10 mEq Oral TID  . saccharomyces boulardii  250 mg Oral BID  . torsemide  100 mg Oral BID

## 2020-07-22 NOTE — Progress Notes (Signed)
Physical Therapy Session Note  Patient Details  Name: Amy [name] MRN: [id number] Date of Birth: 1950-10-09  Today's Date: 07/22/2020 PT Individual Time: 1415-1515 PT Individual Time Calculation (min): 60 min   Short Term Goals: Week 3:  PT Short Term Goal 1 (Week 3): STG=LTG due to ELOS  Skilled Therapeutic Interventions/Progress Updates:    Patient in supine and reports still nauseated despite medication earlier.  Excited for home, but still not feeling well.  Reports had several loose BM's today as well and not eating much due to nausea.  Patient supine to sit with CGA initially, latera in session with S.  Sit to stand S, ambulated to bathroom with RW and S except CGA to negotiate threshold into the bathroom.  Patient performed clothing management and hygiene with S.  Patient reported feeling ill and not able to ambulate in the room.  BP measured as noted below.  Patient returned to supine and RN made aware.  Wrapped LE's with ace wraps with pt in supine.  Obtained step to try in the room, but upon return pt was vomiting a little.  Allowed to rest and returned later and pt performed sit to stand with S and ambulated in the room x 150' with RW back and forth to the door.  Patient returned to sit on EOB and with LOB needing min A to recover reporting feeling light headed.  Allowed to rest and oncoming PT made aware.   Left with bed alarm set and call bell in reach.  Orthostatic VS for the past 24 hrs (Last 3 readings):  BP- Sitting Pulse- Sitting BP- Standing at 0 minutes Pulse- Standing at 0 minutes  07/22/20 1456 94/72 105 (!) 77/56 108   Therapy Documentation Precautions:  Precautions Precautions: Fall Precaution Comments: generalized weakness, peritoneal diaylisis, hx seizures Restrictions Weight Bearing Restrictions: No   Therapy/Group: Individual Therapy  Reginia Naas  [place], [place] 07/22/2020, 4:21 PM

## 2020-07-22 NOTE — Progress Notes (Signed)
PROGRESS NOTE   Subjective/Complaints: Patient seen laying in bed this AM.  She states she slept fairly overnight.  She notes she had some nausea/vomitting yesterday after taking meds on an empty stomach.  It resolved afterward.  She is eager for discharge tomorrow.   ROS: Denies CP, SOB, N/V/D  Objective:   No results found. Recent Labs    07/22/20 0540  WBC 10.0  HGB 12.8  HCT 37.4  PLT 296   Recent Labs    07/21/20 0619 07/22/20 0540  NA 127* 128*  K 3.8 4.3  CL 94* 93*  CO2 22 24  GLUCOSE 89 83  BUN 17 22  CREATININE 4.00* 3.82*  CALCIUM 7.8* 7.9*    Intake/Output Summary (Last 24 hours) at 07/22/2020 1032 Last data filed at 07/21/2020 1300 Gross per 24 hour  Intake 200 ml  Output -  Net 200 ml        Physical Exam: Vital Signs Blood pressure 99/63, pulse 95, temperature 98.1 F (36.7 C), temperature source Oral, resp. rate 18, height 5\' 4"  (1.626 m), weight 38 kg, SpO2 99 %.  Constitutional: No distress . Vital signs reviewed. HENT: Normocephalic.  Atraumatic. Eyes: EOMI. No discharge. Cardiovascular: No JVD.   Respiratory: Normal effort.  No stridor.   GI: Non-distended.  +PD site.  Skin: Warm and dry.  Intact. Psych: Normal mood.  Normal behavior. Musc: No edema in extremities.  No tenderness in extremities. Neurologic: Alert and oriented.  Motor: 4-4+/5 throughout, stable  Assessment/Plan: 1. Functional deficits which require 3+ hours per day of interdisciplinary therapy in a comprehensive inpatient rehab setting.  Physiatrist is providing close team supervision and 24 hour management of active medical problems listed below.  Physiatrist and rehab team continue to assess barriers to discharge/monitor patient progress toward functional and medical goals  Care Tool:  Bathing    Body parts bathed by patient: Right arm,Left arm,Chest,Abdomen,Front perineal area,Right upper leg,Left upper  leg,Face,Right lower leg,Left lower leg,Buttocks   Body parts bathed by helper: Buttocks     Bathing assist Assist Level: Supervision/Verbal cueing     Upper Body Dressing/Undressing Upper body dressing   What is the patient wearing?: Pull over shirt    Upper body assist Assist Level: Set up assist    Lower Body Dressing/Undressing Lower body dressing      What is the patient wearing?: Underwear/pull up,Pants     Lower body assist Assist for lower body dressing: Supervision/Verbal cueing     Toileting Toileting    Toileting assist Assist for toileting: Contact Guard/Touching assist     Transfers Chair/bed transfer  Transfers assist     Chair/bed transfer assist level: Contact Guard/Touching assist     Locomotion Ambulation   Ambulation assist      Assist level: Contact Guard/Touching assist Assistive device: Walker-rolling Max distance: 15'   Walk 10 feet activity   Assist  Walk 10 feet activity did not occur: Safety/medical concerns  Assist level: Contact Guard/Touching assist Assistive device: Walker-rolling   Walk 50 feet activity   Assist Walk 50 feet with 2 turns activity did not occur: Safety/medical concerns  Assist level: Contact Guard/Touching assist Assistive device: Walker-rolling  Walk 150 feet activity   Assist Walk 150 feet activity did not occur: Safety/medical concerns  Assist level: Contact Guard/Touching assist Assistive device: Walker-rolling    Walk 10 feet on uneven surface  activity   Assist Walk 10 feet on uneven surfaces activity did not occur: Safety/medical concerns         Wheelchair     Assist Will patient use wheelchair at discharge?: Yes Type of Wheelchair: Manual Wheelchair activity did not occur: Safety/medical concerns  Wheelchair assist level: Minimal Assistance - Patient > 75% Max wheelchair distance: 120'    Wheelchair 50 feet with 2 turns activity    Assist    Wheelchair  50 feet with 2 turns activity did not occur: Safety/medical concerns   Assist Level: Minimal Assistance - Patient > 75%   Wheelchair 150 feet activity     Assist  Wheelchair 150 feet activity did not occur: Safety/medical concerns       Medical Problem List and Plan: 1.Functional and mobility deficitssecondary to debility after multiple medical issues-CHF, hx of C diff colitis, recent R hip fx 12/21 with ORIF  Continue CIR, pt and family education 2. Antithrombotics: -DVT/anticoagulation:Mechanical:Antiembolism stockings, knee (TED hose) Bilateral lower extremities Sequential compression devices, below kneeBilateral lower extremities -antiplatelet therapy: ASA/plavix 3.Chronic daily frequent HA/Pain Management:tylenol for basic pain -fioricet for breakthrough headaches (on this PTA)  Controlled with meds on 3/22 4. Mood:fairly positive. Team to provide ego support as needed -antipsychotic agents: n/a 5. Neuropsych: This patientiscapable of making decisions on herown behalf. 6. Skin/Wound Care:Routine pressure relief measures. 7. Fluids/Electrolytes/Nutrition:Monitor I/Os.  8. ESRD: Continue daily PD, dialysis doing well at this point. 9. CAD s/p PCI: On ASA/Plavix, metoprolol and lipitor. Monitor for symptoms with increase in activity.  10. New onset seizures: Continue Keppra bid Off Wellbutrin. Monitor for anxiety symptoms.  No breakthrough seizures since admission to CIR-3/22 11. H/o A flutter: Monitor HR tid--continue metoprolol. 12. Anemia of chronic disease: Monitor with serial H/H--improved post transfusion.  --On Aranesp weekly  Hemoglobin 12.8 on 3/22 (likely concentrated)  Hemoccult negative   Continue to monitor 13. C diff colitis: Completed treatment with Dificid on 02/27 -had one watery stool 3/18 followed by small and then smear mushy stool. Don't think we  need to check stool for c diff.    Added probiotic 14. Chronic combined CHF: Takatsubo CM with EF--25-30% -- Monitor weights daily.  --On demadex bid with Kdur for supplement as well as metoprolol and statin. Filed Weights   07/21/20 1805 07/22/20 0410 07/22/20 0615  Weight: 44 kg 38 kg 38 kg   Stable on 3/22 15.  Nausea, recent C. difficile colitis, end-stage renal disease, gastroparesis  Suspect this is chronic given PD, electrolyte abnormalities, confirmed by daughter.  However, patient states this is new.  KUB ordered, showing oral contrast throughout the colon, otherwise relatively unremarkable  Continue Reglan 5 BID, repeat ECG showing persistent prolonged QTC, DCed Reglan given improvement, will consider restarting necessary  Continue PPI-history of ulcer  -prn compazine   Controlled at present 16.  Sleep disturbance   Continue melatonin scheduled and ambien prn  -some improvement 17.  Hypoalbuminemia  Supplement initiated 18.  Prolonged QTC  See #15  Zofran DC'd--patient states ineffective anyway. 19.  Hypokalemia  Potassium 4.3 on 3/22  Continue per renal recs  LOS: 18 days A FACE TO FACE EVALUATION WAS PERFORMED  Ankit Lorie Phenix 07/22/2020, 10:32 AM

## 2020-07-23 ENCOUNTER — Inpatient Hospital Stay (HOSPITAL_COMMUNITY): Payer: Medicare Other

## 2020-07-23 DIAGNOSIS — I951 Orthostatic hypotension: Secondary | ICD-10-CM

## 2020-07-23 LAB — CBC WITH DIFFERENTIAL/PLATELET
Abs Immature Granulocytes: 0.05 10*3/uL (ref 0.00–0.07)
Basophils Absolute: 0.1 10*3/uL (ref 0.0–0.1)
Basophils Relative: 1 %
Eosinophils Absolute: 0.4 10*3/uL (ref 0.0–0.5)
Eosinophils Relative: 4 %
HCT: 37.4 % (ref 36.0–46.0)
Hemoglobin: 12.7 g/dL (ref 12.0–15.0)
Immature Granulocytes: 1 %
Lymphocytes Relative: 12 %
Lymphs Abs: 1.2 10*3/uL (ref 0.7–4.0)
MCH: 32.8 pg (ref 26.0–34.0)
MCHC: 34 g/dL (ref 30.0–36.0)
MCV: 96.6 fL (ref 80.0–100.0)
Monocytes Absolute: 0.7 10*3/uL (ref 0.1–1.0)
Monocytes Relative: 7 %
Neutro Abs: 7.9 10*3/uL — ABNORMAL HIGH (ref 1.7–7.7)
Neutrophils Relative %: 75 %
Platelets: 326 10*3/uL (ref 150–400)
RBC: 3.87 MIL/uL (ref 3.87–5.11)
RDW: 19.9 % — ABNORMAL HIGH (ref 11.5–15.5)
WBC: 10.3 10*3/uL (ref 4.0–10.5)
nRBC: 0 % (ref 0.0–0.2)

## 2020-07-23 LAB — RENAL FUNCTION PANEL
Albumin: 1.6 g/dL — ABNORMAL LOW (ref 3.5–5.0)
Anion gap: 12 (ref 5–15)
BUN: 24 mg/dL — ABNORMAL HIGH (ref 8–23)
CO2: 23 mmol/L (ref 22–32)
Calcium: 7.9 mg/dL — ABNORMAL LOW (ref 8.9–10.3)
Chloride: 92 mmol/L — ABNORMAL LOW (ref 98–111)
Creatinine, Ser: 3.96 mg/dL — ABNORMAL HIGH (ref 0.44–1.00)
GFR, Estimated: 12 mL/min — ABNORMAL LOW (ref >60)
Glucose, Bld: 86 mg/dL (ref 70–99)
Phosphorus: 3.6 mg/dL (ref 2.5–4.6)
Potassium: 4 mmol/L (ref 3.5–5.1)
Sodium: 127 mmol/L — ABNORMAL LOW (ref 135–145)

## 2020-07-23 LAB — URINALYSIS, ROUTINE W REFLEX MICROSCOPIC
Bilirubin Urine: NEGATIVE
Glucose, UA: NEGATIVE mg/dL
Hgb urine dipstick: NEGATIVE
Ketones, ur: NEGATIVE mg/dL
Leukocytes,Ua: NEGATIVE
Nitrite: NEGATIVE
Protein, ur: NEGATIVE mg/dL
Specific Gravity, Urine: 1.017 (ref 1.005–1.030)
pH: 5 (ref 5.0–8.0)

## 2020-07-23 LAB — BODY FLUID CELL COUNT WITH DIFFERENTIAL
Eos, Fluid: 6 %
Lymphs, Fluid: 16 %
Monocyte-Macrophage-Serous Fluid: 35 % — ABNORMAL LOW (ref 50–90)
Neutrophil Count, Fluid: 43 % — ABNORMAL HIGH (ref 0–25)
Total Nucleated Cell Count, Fluid: 36 cu mm (ref 0–1000)

## 2020-07-23 MED ORDER — METOCLOPRAMIDE HCL 5 MG PO TABS
5.0000 mg | ORAL_TABLET | Freq: Every day | ORAL | Status: DC
  Administered 2020-07-24: 5 mg via ORAL
  Filled 2020-07-23: qty 1

## 2020-07-23 MED ORDER — METOCLOPRAMIDE HCL 5 MG PO TABS
5.0000 mg | ORAL_TABLET | Freq: Once | ORAL | Status: AC
  Administered 2020-07-23: 5 mg via ORAL
  Filled 2020-07-23: qty 1

## 2020-07-23 MED ORDER — SODIUM CHLORIDE 0.9 % IV SOLN: INTRAVENOUS | Status: DC

## 2020-07-23 MED ORDER — DELFLEX-LC/1.5% DEXTROSE 344 MOSM/L IP SOLN: Freq: Once | INTRAPERITONEAL | Status: DC

## 2020-07-23 MED ORDER — SODIUM CHLORIDE 0.9 % IV SOLN: INTRAVENOUS | Status: AC

## 2020-07-23 MED ORDER — SODIUM CHLORIDE 0.9 % IV BOLUS: 1000.0000 mL | Freq: Once | INTRAVENOUS | Status: DC

## 2020-07-23 NOTE — Progress Notes (Signed)
Patient reported to have Temp-100 as well as reports of frequency. She was given tylenol--advised RN to hold to see if patient's temp trends higher. Will pan culture due to recent issues with hypotension ?SIRS v/s acute on chronic as husband did report that "she had a pill to take if her BP went low" but has never had to take it.  I reached out to nephrology PA who will put in orders to culture PD to rule out peritonitis--to hold antibiotics pending results. They plan on holding PD tonight to allow for BP to recover. Will also order CBC to trend white count.

## 2020-07-23 NOTE — Progress Notes (Signed)
Noted temp of 100 this afternoon. All other vital signs at baseline. Pam, PA made aware. New orders obtained. UA/CS sent to lab.   Gerald Stabs, RN

## 2020-07-23 NOTE — Progress Notes (Signed)
Patient ID: Amy [name], female   DOB: 09-05-1950, 70 y.o.   MRN: [id number]  According to Pam-PA and MD pt is not discharging today due to medical issues. Probably discharge tomorrow.

## 2020-07-23 NOTE — Progress Notes (Addendum)
PROGRESS NOTE   Subjective/Complaints: Patient seen sitting up in bed this AM.  She states she slept well overnight.  She notes nausea/vomitting after breakfast this AM, believes it is secondary to her food.  Yesterday and this AM she was noted to be hypotensive. Discussed with Nephro - IVF bolus.  Will decrease rate given significant CHF.  She was seen by Nephro yesterday, notes reviewed - Vitamin, Sensipar, Demedex d/ced.  ROS: Denies CP, SOB, diarrhea.  Objective:   No results found. Recent Labs    07/22/20 0540  WBC 10.0  HGB 12.8  HCT 37.4  PLT 296   Recent Labs    07/22/20 0540 07/23/20 0454  NA 128* 127*  K 4.3 4.0  CL 93* 92*  CO2 24 23  GLUCOSE 83 86  BUN 22 24*  CREATININE 3.82* 3.96*  CALCIUM 7.9* 7.9*    Intake/Output Summary (Last 24 hours) at 07/23/2020 1044 Last data filed at 07/23/2020 0840 Gross per 24 hour  Intake 240 ml  Output 50 ml  Net 190 ml        Physical Exam: Vital Signs Blood pressure (!) 89/60, pulse (!) 102, temperature 98.2 F (36.8 C), temperature source Oral, resp. rate 16, height 5\' 4"  (1.626 m), weight 41.6 kg, SpO2 99 %.  Constitutional: No distress . Vital signs reviewed. HENT: Normocephalic.  Atraumatic. Eyes: EOMI. No discharge. Cardiovascular: No JVD.  RRR. Respiratory: Normal effort.  No stridor.  Bilateral clear to auscultation. GI: Non-distended.  BS +. +PD site.  Skin: Warm and dry.  Intact. Psych: Normal mood.  Normal behavior. Musc: No edema in extremities.  No tenderness in extremities. Neurologic: Alert and oriented.  Motor: 4-4+/5 throughout, unchanged  Assessment/Plan: 1. Functional deficits which require 3+ hours per day of interdisciplinary therapy in a comprehensive inpatient rehab setting.  Physiatrist is providing close team supervision and 24 hour management of active medical problems listed below.  Physiatrist and rehab team continue to  assess barriers to discharge/monitor patient progress toward functional and medical goals  Care Tool:  Bathing    Body parts bathed by patient: Right arm,Left arm,Chest,Abdomen,Front perineal area,Right upper leg,Left upper leg,Face,Right lower leg,Left lower leg,Buttocks   Body parts bathed by helper: Buttocks     Bathing assist Assist Level: Supervision/Verbal cueing     Upper Body Dressing/Undressing Upper body dressing   What is the patient wearing?: Pull over shirt    Upper body assist Assist Level: Set up assist    Lower Body Dressing/Undressing Lower body dressing      What is the patient wearing?: Underwear/pull up,Pants     Lower body assist Assist for lower body dressing: Supervision/Verbal cueing     Toileting Toileting    Toileting assist Assist for toileting: Supervision/Verbal cueing     Transfers Chair/bed transfer  Transfers assist     Chair/bed transfer assist level: Supervision/Verbal cueing Chair/bed transfer assistive device: Programmer, multimedia   Ambulation assist      Assist level: Supervision/Verbal cueing Assistive device: Walker-rolling Max distance: 80ft   Walk 10 feet activity   Assist  Walk 10 feet activity did not occur: Safety/medical concerns  Assist level: Supervision/Verbal  cueing Assistive device: Walker-rolling   Walk 50 feet activity   Assist Walk 50 feet with 2 turns activity did not occur: Safety/medical concerns  Assist level: Supervision/Verbal cueing Assistive device: Walker-rolling    Walk 150 feet activity   Assist Walk 150 feet activity did not occur: Safety/medical concerns  Assist level: Supervision/Verbal cueing Assistive device: Walker-rolling    Walk 10 feet on uneven surface  activity   Assist Walk 10 feet on uneven surfaces activity did not occur: Safety/medical concerns         Wheelchair     Assist Will patient use wheelchair at discharge?: No Type of  Wheelchair: Manual Wheelchair activity did not occur: Safety/medical concerns  Wheelchair assist level: Minimal Assistance - Patient > 75% Max wheelchair distance: 120'    Wheelchair 50 feet with 2 turns activity    Assist    Wheelchair 50 feet with 2 turns activity did not occur: Safety/medical concerns   Assist Level: Minimal Assistance - Patient > 75%   Wheelchair 150 feet activity     Assist  Wheelchair 150 feet activity did not occur: Safety/medical concerns       Medical Problem List and Plan: 1.Functional and mobility deficitssecondary to debility after multiple medical issues-CHF, hx of C diff colitis, recent R hip fx 12/21 with ORIF  Patient was planned to be d/ced today, however, given significant hypotension and orthostasis, will give IVF and monitor for improvement in BP.   Patient and family edu, plan for d/c tomorrow  Team conference today to discuss current and goals and coordination of care, home and environmental barriers, and discharge planning with nursing, case manager, and therapies. Please see conference note from today as well.  2. Antithrombotics: -DVT/anticoagulation:Mechanical:Antiembolism stockings, knee (TED hose) Bilateral lower extremities Sequential compression devices, below kneeBilateral lower extremities -antiplatelet therapy: ASA/plavix 3.Chronic daily frequent HA/Pain Management:tylenol for basic pain -fioricet for breakthrough headaches (on this PTA)  Controlled with meds on 3/23 4. Mood:fairly positive. Team to provide ego support as needed -antipsychotic agents: n/a 5. Neuropsych: This patientiscapable of making decisions on herown behalf. 6. Skin/Wound Care:Routine pressure relief measures. 7. Fluids/Electrolytes/Nutrition:Monitor I/Os.  8. ESRD: Continue daily PD, dialysis doing well at this point. 9. CAD s/p PCI: On ASA/Plavix, metoprolol and lipitor. Monitor for  symptoms with increase in activity.  10. New onset seizures: Continue Keppra bid Off Wellbutrin. Monitor for anxiety symptoms.  No breakthrough seizures since admission to CIR-3/23 11. H/o A flutter: Monitor HR tid--continue metoprolol. 12. Anemia of chronic disease: Monitor with serial H/H--improved post transfusion.  On Aranesp weekly  Hemoglobin 12.8 on 3/22 (likely concentrated)  Hemoccult negative   Continue to monitor 13. C diff colitis: Completed treatment with Dificid on 02/27 -had one watery stool 3/18 followed by small and then smear mushy stool. Don't think we need to check stool for c diff.    Added probiotic 14. Chronic combined CHF: Takatsubo CM with EF--25-30% -- Monitor weights daily.  Demadex d/ced per Nephro  D/ced KDUR given d/c of demedex  Metoprolol and statin. Filed Weights   07/22/20 0615 07/22/20 1745 07/23/20 0900  Weight: 38 kg 41.1 kg 41.6 kg   Monitor closed given IVF  Stable on 3/23 15.  Nausea, recent C. difficile colitis, end-stage renal disease, gastroparesis  Suspect this is chronic given PD, electrolyte abnormalities, confirmed by daughter.  However, patient states this is new.  KUB ordered, showing oral contrast throughout the colon, otherwise relatively unremarkable  Continue Reglan 5 BID, repeat ECG showing  persistent prolonged QTC, DCed Reglan given improvement, restarted on 3/23 given recurrent symptoms  Continue PPI-history of ulcer  -prn compazine   Follow up with GI as outpt for gastroparesis 16.  Sleep disturbance   Continue melatonin scheduled and ambien prn  -some improvement 17.  Hypoalbuminemia  Supplement initiated 18.  Prolonged QTC  See #15  Zofran DC'd--patient states ineffective anyway. 19.  Hypokalemia  Potassium 4.3 on 3/22, labs ordered for tomorrow  Continue per renal recs 20. Symptomatic orthostasis  See #8, #14  >35 in total in reviewing records,  discussing with providers, therapists, team given nausea/vomiting, symptomatic orthostasis  LOS: 19 days A FACE TO FACE EVALUATION WAS PERFORMED  Aria Pickrell Lorie Phenix 07/23/2020, 10:44 AM

## 2020-07-23 NOTE — Progress Notes (Signed)
   07/23/20 0939  Assess: MEWS Score  Temp 98.2 F (36.8 C)  BP (!) 83/65 (with Ace wrap)  Pulse Rate (!) 102  Resp 16  SpO2 99 %  O2 Device Room Air  Assess: MEWS Score  MEWS Temp 0  MEWS Systolic 1  MEWS Pulse 1  MEWS RR 0  MEWS LOC 0  MEWS Score 2  MEWS Score Color Yellow  Assess: if the MEWS score is Yellow or Red  Were vital signs taken at a resting state? No  Focused Assessment No change from prior assessment  Early Detection of Sepsis Score *See Row Information* Low  MEWS guidelines implemented *See Row Information* Yes  Treat  MEWS Interventions Escalated (See documentation below)  Take Vital Signs  Increase Vital Sign Frequency  Yellow: Q 2hr X 2 then Q 4hr X 2, if remains yellow, continue Q 4hrs  Escalate  MEWS: Escalate Yellow: discuss with charge nurse/RN and consider discussing with provider and RRT  Notify: Charge Nurse/RN  Name of Charge Nurse/RN Notified Chelsea E RN  Date Charge Nurse/RN Notified 07/23/20  Time Charge Nurse/RN Notified 50  Notify: Provider  Provider Name/Title Pam, PA  Date Provider Notified 07/23/20  Time Provider Notified [phone]  Notification Type Face-to-face  Notification Reason Other (Comment) (Orthostatic Vitals)  Provider response See new orders  Date of Provider Response 07/23/20  Time of Provider Response 0945  Document  Patient Outcome Stabilized after interventions

## 2020-07-23 NOTE — Patient Care Conference (Signed)
Inpatient RehabilitationTeam Conference and Plan of Care Update Date: 07/23/2020   Time: 11:36 AM    Patient Name: Amy [name]      Medical Record Number: [id number]  Date of Birth: 07/27/1950 Sex: Female         Room/Bed: 4W10C/4W10C-01 Payor Info: Payor: MEDICARE / Plan: MEDICARE PART A AND B / Product Type: *No Product type* /    Admit Date/Time:  07/04/2020  6:17 PM  Primary Diagnosis:  [facility] Problems: Principal Problem:   Debility Active Problems:   Hypoalbuminemia due to protein-calorie malnutrition (HCC)   Nausea and vomiting   Chronic combined systolic and diastolic CHF (congestive heart failure) (HCC)   Anemia of chronic disease   Seizures (HCC)   Chronic pain syndrome   Nausea   Prolonged Q-T interval on ECG   Chronic combined systolic and diastolic congestive heart failure ([place])   Gastroparesis   Orthostasis    Expected Discharge Date: Expected Discharge Date: 07/24/20  Team Members Present: Physician leading conference: Dr. Delice Lesch Care Coodinator Present: Dorien Chihuahua, RN, BSN, CRRN;Becky Dupree, LCSW Nurse Present: Dorien Chihuahua, RN PT Present: Magda Kiel, PT OT Present: Elisabeth Most, OT PPS Coordinator present : Ileana Ladd, PT     Current Status/Progress Goal Weekly Team Focus  Bowel/Bladder   pt continent of B/B. BM-3/22  Pt will remain continent of B/B  assess q shift and PRN   Swallow/Nutrition/ Hydration             ADL's             Mobility             Communication             Safety/Cognition/ Behavioral Observations            Pain   pt complains chronic headache  Pain<3. medication in progress, effective  assess pain q shift and PRN   Skin   bilaterally bruising to arms and redness to buttocks. PD catheter to abdomen ([place]).  Pt will remain free of skin breakdown and infection  assess skin q shift and PRN     Discharge Planning:  Husband was here for education and it went well. Have arranged 24/7 care  and ready for pt to discharge home   Team Discussion: N/V for past two days and orthostasis with ace wraps on. MD added medication for nausea and fluid bolus. Progress limited by fatigue and limited participation in therapy sessions.   Patient on target to meet rehab goals: yes,  Ocassional loss of balance but supervision overall.  *See Care Plan and progress notes for long and short-term goals.   Revisions to Treatment Plan:  Adjust medications and fluid bolus  Teaching Needs:   Current Barriers to Discharge: Decreased caregiver support, Home enviroment access/layout and Peritoneal Dialysis  Possible Resolutions to Barriers: Family education completed    Medical Summary Current Status: Functional and mobility deficits secondary to debility after multiple medical issues-CHF, hx of C diff colitis, recent R hip fx 12/21 with ORIF  Barriers to Discharge: Other (comments);Medical stability;Behavior;Decreased family/caregiver support  Barriers to Discharge Comments: +PD, orthostasis Possible Resolutions to [employer] Focus: Patient and family edu, IVF bolus, follow labs - K, plan for d/c tomorrow if orthostatics/flud stable   Continued Need for Acute Rehabilitation Level of Care: The patient requires daily medical management by a physician with specialized training in physical medicine and rehabilitation for the following reasons: Direction of a multidisciplinary physical  rehabilitation program to maximize functional independence : Yes Medical management of patient stability for increased activity during participation in an intensive rehabilitation regime.: Yes Analysis of laboratory values and/or radiology reports with any subsequent need for medication adjustment and/or medical intervention. : Yes   I attest that I was present, lead the team conference, and concur with the assessment and plan of the team.   Dorien Chihuahua B 07/23/2020, 4:17 PM

## 2020-07-23 NOTE — Progress Notes (Addendum)
Subjective: Noted discharge held secondary to nausea this a.m., had low blood pressure given IV fluids, said tolerated PD last night, now reviewing past records here, history of hyponatremia asymptomatic thus will use all 1.5 with HD today.  Objective Vital signs in last 24 hours: Vitals:   07/23/20 0930 07/23/20 0933 07/23/20 0939 07/23/20 1000  BP: (!) 73/49 (!) 62/28 (!) 83/65 (!) 89/60  Pulse: (!) 103 (!) 114 (!) 102   Resp:   16   Temp:   98.2 F (36.8 C)   TempSrc:   Oral   SpO2: 95% 100% 99%   Weight:      Height:       Weight change: -2.9 kg   Physical Exam: General:Alert elderly female, currently pleasant, NAD Heart:RRR no MRG Lungs:CTA nonlabored room air Abdomen:Positive BS, soft NTND PD site stable Extremities:PD cath in right lower quadrant nontender dressing dry Dialysis Access:No pedal edema  Dialysis Orders: [facility] followed by Dr. Candiss Norse. 4 exchanges overnight, 2000 cc and 1.5 hrs each.   Assessment/Plan:  1. Multivessel CAD with reduced EF 25/30%: Eval by TCTS, is"poor CABG candidate". s/pPCI to LAD with DESon 2/25.gettingPlavix and ASA 81 mg,+ statin 2. Debility: 2/2 Multiple med issues mainly CAD>now on CIR, noted"may be discharged Wednesday 3/23" 3. ESRD - Continue [name]. Stable. With hyponatremiareviewing records history of.  Will use all 1.5 bags  on PD. also does not make much urine does have DC torsemide 4. History of hyponatremia chronic= NA 127, reviewing past records has history of hyponatremia asymptomatic change PD bags as above not an issue of volume overload 5. Hypokalemia:A.m. K 4.0 potassium  on 56mEq TID, potassium level now at goal. 6. HTN/volume -BP low this a.m. giving IV fluids, use 1.5 PD bags tonight,on metoprolol 25 mg daily.  7. Anemiaof ESRD-Hgb9.7 (3/18) , 12.8( 3/22)^ ?  related to dehydration, can hold Aranesp if still up. s/p 1unitPRBCon 2/20 and on 2/27. Continue Aranesp 100 MCG  q Saturday. tsat 91%,ferritin 1131. No need for iron at this time. 8. Secondary hyperparathyroidism: Phos at goal with no binders. On Sensipar 30mg  daily  have DC with her nausea,stoppedRocaltrol withelevatedcorrectedcalcium>10, calcium now controlled.  9. Nutrition: Albumin low 1.6. (Related to PD and decrease protein intake )on regular diet with prot supp.  10. Seizures - do to electrolyte abnormality in setting of C diff. Now on Keppra per neuro. No breakthrough seizures since admission to CIR  11. C diff - finished ABX course on 2/27. 12. Disposition-CIR admit for rehab 13. Nausea-unclear cause, possible  decrease BP this a.m., medication issue  yesterday DC Sensipar and renal vitamin, torsemide,no evidence of peritonitis. Corrected electrolyte abnormalities as above and has compazine PRN ordered  Ernest Haber, PA-C [facility] Beeper [phone] 07/23/2020,12:39 PM  LOS: 19 days   Labs: Basic Metabolic Panel: Recent Labs  Lab 07/21/20 0619 07/22/20 0540 07/23/20 0454  NA 127* 128* 127*  K 3.8 4.3 4.0  CL 94* 93* 92*  CO2 22 24 23   GLUCOSE 89 83 86  BUN 17 22 24*  CREATININE 4.00* 3.82* 3.96*  CALCIUM 7.8* 7.9* 7.9*  PHOS 3.3 3.5 3.6   Liver Function Tests: Recent Labs  Lab 07/21/20 0619 07/22/20 0540 07/23/20 0454  ALBUMIN 1.5* 1.6* 1.6*   No results for input(s): LIPASE, AMYLASE in the last 168 hours. No results for input(s): AMMONIA in the last 168 hours. CBC: Recent Labs  Lab 07/18/20 0455 07/22/20 0540  WBC 6.5 10.0  NEUTROABS  --  7.6  HGB 9.7* 12.8  HCT 28.5* 37.4  MCV 96.6 96.1  PLT 215 296   Cardiac Enzymes: No results for input(s): CKTOTAL, CKMB, CKMBINDEX, TROPONINI in the last 168 hours. CBG: No results for input(s): GLUCAP in the last 168 hours.  Studies/Results: No results found. Medications: . sodium chloride 100 mL/hr at 07/23/20 1100  . dialysis solution 1.5% low-MG/low-CA    . dialysis solution 2.5%  low-MG/low-CA     . (feeding supplement) PROSource Plus  30 mL Oral BID BM  . aspirin  81 mg Oral Daily  . atorvastatin  80 mg Oral q1800  . Chlorhexidine Gluconate Cloth  6 each Topical Daily  . clopidogrel  75 mg Oral Daily  . darbepoetin (ARANESP) injection - NON-DIALYSIS  100 mcg Subcutaneous Q Sat-1800  . feeding supplement  1 Container Oral TID BM  . feeding supplement (NEPRO CARB STEADY)  237 mL Oral TID WC  . gentamicin cream  1 application Topical Daily  . levETIRAcetam  500 mg Oral BID  . melatonin  3 mg Oral QHS  . [START ON 07/24/2020] metoCLOPramide  5 mg Oral Q0600  . metoprolol succinate  25 mg Oral Daily  . pantoprazole  40 mg Oral Daily  . saccharomyces boulardii  250 mg Oral BID      WITH VOLUME ISSUES AND LABS OKAY WE WILL NOT DO PD TONIGHT

## 2020-07-24 LAB — RENAL FUNCTION PANEL
Albumin: 1.4 g/dL — ABNORMAL LOW (ref 3.5–5.0)
Anion gap: 14 (ref 5–15)
BUN: 23 mg/dL (ref 8–23)
CO2: 18 mmol/L — ABNORMAL LOW (ref 22–32)
Calcium: 7.7 mg/dL — ABNORMAL LOW (ref 8.9–10.3)
Chloride: 96 mmol/L — ABNORMAL LOW (ref 98–111)
Creatinine, Ser: 4.25 mg/dL — ABNORMAL HIGH (ref 0.44–1.00)
GFR, Estimated: 11 mL/min — ABNORMAL LOW (ref >60)
Glucose, Bld: 84 mg/dL (ref 70–99)
Phosphorus: 4 mg/dL (ref 2.5–4.6)
Potassium: 3.8 mmol/L (ref 3.5–5.1)
Sodium: 128 mmol/L — ABNORMAL LOW (ref 135–145)

## 2020-07-24 LAB — CBC WITH DIFFERENTIAL/PLATELET
Abs Immature Granulocytes: 0.04 10*3/uL (ref 0.00–0.07)
Basophils Absolute: 0.1 10*3/uL (ref 0.0–0.1)
Basophils Relative: 1 %
Eosinophils Absolute: 0.4 10*3/uL (ref 0.0–0.5)
Eosinophils Relative: 4 %
HCT: 34.8 % — ABNORMAL LOW (ref 36.0–46.0)
Hemoglobin: 12 g/dL (ref 12.0–15.0)
Immature Granulocytes: 0 %
Lymphocytes Relative: 11 %
Lymphs Abs: 1.1 10*3/uL (ref 0.7–4.0)
MCH: 33.4 pg (ref 26.0–34.0)
MCHC: 34.5 g/dL (ref 30.0–36.0)
MCV: 96.9 fL (ref 80.0–100.0)
Monocytes Absolute: 0.6 10*3/uL (ref 0.1–1.0)
Monocytes Relative: 6 %
Neutro Abs: 7.5 10*3/uL (ref 1.7–7.7)
Neutrophils Relative %: 78 %
Platelets: 282 10*3/uL (ref 150–400)
RBC: 3.59 MIL/uL — ABNORMAL LOW (ref 3.87–5.11)
RDW: 19.9 % — ABNORMAL HIGH (ref 11.5–15.5)
WBC: 9.7 10*3/uL (ref 4.0–10.5)
nRBC: 0 % (ref 0.0–0.2)

## 2020-07-24 LAB — PATHOLOGIST SMEAR REVIEW

## 2020-07-24 MED ORDER — GENTAMICIN SULFATE 0.1 % EX CREA: 1.0000 "application " | TOPICAL_CREAM | Freq: Every day | CUTANEOUS | 0 refills | Status: DC

## 2020-07-24 MED ORDER — PROCHLORPERAZINE MALEATE 10 MG PO TABS: 10.0000 mg | ORAL_TABLET | Freq: Three times a day (TID) | ORAL | 0 refills | Status: DC | PRN

## 2020-07-24 MED ORDER — METOCLOPRAMIDE HCL 5 MG PO TABS: 5.0000 mg | ORAL_TABLET | Freq: Every day | ORAL | 0 refills | Status: DC

## 2020-07-24 MED ORDER — PANTOPRAZOLE SODIUM 40 MG PO TBEC: 40.0000 mg | DELAYED_RELEASE_TABLET | Freq: Every day | ORAL | 0 refills | Status: DC

## 2020-07-24 MED ORDER — LEVETIRACETAM 500 MG PO TABS: 500.0000 mg | ORAL_TABLET | Freq: Two times a day (BID) | ORAL | 1 refills | Status: DC

## 2020-07-24 MED ORDER — SACCHAROMYCES BOULARDII 250 MG PO CAPS: 250.0000 mg | ORAL_CAPSULE | Freq: Two times a day (BID) | ORAL | 0 refills | Status: DC

## 2020-07-24 MED ORDER — MELATONIN 3 MG PO TABS: 3.0000 mg | ORAL_TABLET | Freq: Every day | ORAL | 0 refills | Status: DC

## 2020-07-24 MED ORDER — METOPROLOL SUCCINATE ER 25 MG PO TB24: 25.0000 mg | ORAL_TABLET | Freq: Every day | ORAL | 0 refills | Status: DC

## 2020-07-24 NOTE — Progress Notes (Signed)
Pt c/o headache, moderate pain; gave 1 tab Firoecet, since pt states tylenol upsets her stomach. Educated pt that [employer] contains same ingredient, acetaminophen, as Tylenol, but Pt states she's taken it for 40 years w/ no problems. Will continue to monitor for nausea/upset stomach.

## 2020-07-24 NOTE — Discharge Summary (Addendum)
Physician Discharge Summary  Patient ID: Amy [name] MRN: [id number] DOB/AGE: 1950/05/11 70 y.o.  Admit date: 07/04/2020 Discharge date: 07/24/2020  Discharge Diagnoses:  Principal Problem:   Debility Active Problems:   Hypoalbuminemia due to protein-calorie malnutrition (HCC)   Nausea and vomiting   Chronic combined systolic and diastolic CHF (congestive heart failure) (HCC)   Anemia of chronic disease   Seizures (HCC)   Chronic pain syndrome   Nausea   Prolonged Q-T interval on ECG   Chronic combined systolic and diastolic congestive heart failure (HCC)   Gastroparesis   Orthostasis   Discharged Condition: stable   Significant Diagnostic Studies: DG Chest 2 View  Result Date: 07/23/2020 CLINICAL DATA:  Fever, EXAM: CHEST - 2 VIEW COMPARISON:  06/22/2020 FINDINGS: Lungs are well expanded, symmetric, and clear. No pneumothorax or pleural effusion. Cardiac size within normal limits. Pulmonary vascularity is normal. Osseous structures are age-appropriate. No acute bone abnormality. IMPRESSION: No active cardiopulmonary disease. Electronically Signed   By: Fidela Salisbury MD   On: 07/23/2020 17:43   DG Abd 1 View  Result Date: 07/09/2020 CLINICAL DATA:  Nausea.  Dialysis. EXAM: ABDOMEN - 1 VIEW COMPARISON:  CT 09/26/2016. FINDINGS: Peritoneal dialysis catheter noted. No bowel distention. Oral contrast noted throughout the colon. No free air. Undigested pill fragment noted in the stomach. Severe scoliosis and degenerative changes lumbar spine. Postsurgical changes right hip. Visceral vascular calcification. Bibasilar atelectasis and or scarring. Tiny bilateral pleural effusions cannot be excluded. IMPRESSION: 1. Peritoneal dialysis catheter noted. No bowel distention. Oral contrast noted throughout the colon. No acute abnormality identified. 2. Bibasilar atelectasis and or scarring. Tiny bilateral pleural effusions cannot be excluded. Electronically Signed   By: Marcello Moores  Register   On:  07/09/2020 10:49    Labs:  Basic Metabolic Panel: Recent Labs  Lab 07/18/20 0455 07/19/20 0451 07/20/20 0923 07/21/20 3007 07/22/20 0540 07/23/20 0454 07/24/20 0452  NA 127* 126* 125* 127* 128* 127* 128*  K 3.7 3.6 3.8 3.8 4.3 4.0 3.8  CL 93* 92* 92* 94* 93* 92* 96*  CO2 24 23 18* 22 24 23  18*  GLUCOSE 86 80 80 89 83 86 84  BUN 18 17 17 17 22  24* 23  CREATININE 4.13* 4.06* 3.88* 4.00* 3.82* 3.96* 4.25*  CALCIUM 7.5* 7.5* 7.4* 7.8* 7.9* 7.9* 7.7*  PHOS 3.6 3.4 3.5 3.3 3.5 3.6 4.0    CBC: Recent Labs  Lab 07/22/20 0540 07/23/20 1816 07/24/20 0452  WBC 10.0 10.3 9.7  NEUTROABS 7.6 7.9* 7.5  HGB 12.8 12.7 12.0  HCT 37.4 37.4 34.8*  MCV 96.1 96.6 96.9  PLT 296 326 282    CBG: No results for input(s): GLUCAP in the last 168 hours.  Brief HPI:   [name] is a 70 y.o. female with history of ESRD-on PD, HTN, gastritis, a flutter, anxiety disorder, recent admission for diarrhea due to C. difficile colitis who was discharged to home on oral vancomycin but was unable to get her meds.  She was readmitted on 06/19/2020 with mental status changes due to brief seizure from multiple metabolic derangement and volume depletion.  EEG showed nonspecific cortical dysfunction in bifrontal lobe with moderate diffuse encephalopathy.  She was loaded with Keppra and found to have elevated cardiac enzymes due to NSTEMI from demand ischemia.  Neurology recommended continuing Keppra 500 mg twice daily and no driving x6 months as well as follow-up with neurology on outpatient basis for adjustment of enough medication.  Cardiology was consulted for input  and 2D echo done showed intact super cardiomyopathy with combined systolic diastolic CHF.  CTA chest was negative for PE but showed coronary calcification cardiac cath revealed severe multivessel disease and she underwent PCI 02/25 as not felt to be a CABG candidate.  Postop RUE hematoma treated with elevation and compression and acute on chronic  blood loss anemia treated with 1 unit PRBC.  Heart failure team has been following for optimization of medication which has been limited in part by soft blood pressures.  She continued to be limited by fatigue and weakness with decreased weightbearing LLE, delayed processing, had memory deficits affecting ADLs.  CIR was recommended due to functional decline.   Hospital Course: BRITNI DRISCOLL was admitted to rehab 07/04/2020 for inpatient therapies to consist of PT, and OT at least three hours five days a week. Past admission physiatrist, therapy team and rehab RN have worked together to provide customized collaborative inpatient rehab. She has continued to have variable intake with ongoing bouts of nausea and intermittent diarrhea.  Reglan was discontinued briefly due to prolonged QT and reports of diarrhea.  Compazine has been used on as needed basis to help manage symptoms and Reglan was resumed due to gastroparesis and to help manage symptoms. She continues on home regimen of Fioricet to help manage her migraines with insistence on maintaining regiment despite attempts to wean and discontinue, stating this was the only medication that has worked for her and that she has been on it for decades.  Multiple nutritional supplements were offered during the day to help with intake. HD has been ongoing with nephrology managing PD/dialysate.  Blood pressures were monitored on TID basis and were noted to be running in high 90s.  She did develop some orthostatic changes on 03/22 and Demadex was discontinued.    She has been seizure-free during her stay.  She is tolerating Keppra without lethargy and her mentation and mood has improved.  She has had slow steady improvement in activity tolerance and is at supervision level.  She was originally set for discharge on 03/23 but continued to have significant orthostasis therefore discharge was held and she received 1500 cc IV fluids.  PD was also held that evening due to  concerns of overdiuresis.  Serial check of electrolytes shows sodium to be overall stable.  Follow-up CBC shows H&H to be stable without leukocytosis.  She did have a rise in temp to 100 degrees that evening and was pancultured.  UA and chest x-ray was negative.  PD fluid was sent for Gram stain and culture and showed no white count or organisms.  Cultures are currently pending.  She was noted to have improvement in blood pressures and symptoms the next day and is to continue to use ace wraps BLE when out of bed. She believed she was at her baseline and was going to do things the way she was doing them prior to admission.  Attempted to discuss this with patient and daughter, who was in agreement that patient was unsafe prior to admission. She will continue to receive follow up HHPT and [place] [facility] [place] after discharge.  Rehab course: During patient's stay in rehab weekly team conferences were held to monitor patient's progress, set goals and discuss barriers to discharge. At admission, patient required min assist for ADL tasks and mod assist with mobility. She  has had improvement in activity tolerance, balance, postural control as well as ability to compensate for deficits.   She is able  complete ADL tasks with CGA to supervision. She requires supervision for transfers and mobility. Family education was completed regarding all aspects of safety and care.   Disposition: Home  Diet: Cardiac.   Special Instructions: 1. Needs 24 hours supervision.    Allergies as of 07/24/2020       Reactions   Cephalosporins Hives, Swelling   Clarithromycin Other (See Comments)   Unknown reaction   Gabapentin    Lunesta  [eszopiclone]    Heart racing   Penicillin V    Has patient had a PCN reaction causing immediate rash, facial/tongue/throat swelling, SOB or lightheadedness with hypotension: Yes Has patient had a PCN reaction causing severe rash involving mucus membranes or skin necrosis: No Has  patient had a PCN reaction that required hospitalization No Has patient had a PCN reaction occurring within the last 10 years: No If all of the above answers are "NO", then may proceed with Cephalosporin use.   Sulfa Antibiotics    Unknown reaction        Medication List     STOP taking these medications    calcitRIOL 0.5 MCG capsule Commonly known as: ROCALTROL   cinacalcet 30 MG tablet Commonly known as: SENSIPAR   LORazepam 1 MG tablet Commonly known as: ATIVAN   potassium chloride 10 MEQ tablet Commonly known as: KLOR-CON   sennosides-docusate sodium 8.6-50 MG tablet Commonly known as: SENOKOT-S   torsemide 100 MG tablet Commonly known as: DEMADEX   valACYclovir 1000 MG tablet Commonly known as: VALTREX       TAKE these medications    aspirin 81 MG chewable tablet Chew 1 tablet (81 mg total) by mouth daily.   atorvastatin 80 MG tablet Commonly known as: LIPITOR Take 1 tablet (80 mg total) by mouth daily at 6 PM.   butalbital-acetaminophen-caffeine 50-325-40 MG tablet Commonly known as: FIORICET TAKE 1 TO 2 TABLETS BY MOUTH EVERY 4 HOURS AS NEEDED FOR HEADACHE What changed: See the new instructions.   clopidogrel 75 MG tablet Commonly known as: PLAVIX Take 1 tablet (75 mg total) by mouth daily.   gentamicin cream 0.1 % Commonly known as: GARAMYCIN Apply 1 application topically daily.   levETIRAcetam 500 MG tablet Commonly known as: Keppra Take 1 tablet (500 mg total) by mouth 2 (two) times daily.   melatonin 3 MG Tabs tablet Take 1 tablet (3 mg total) by mouth at bedtime.   metoCLOPramide 5 MG tablet Commonly known as: REGLAN Take 1 tablet (5 mg total) by mouth daily at 6 (six) AM. Start taking on: July 25, 2020   metoprolol succinate 25 MG 24 hr tablet Commonly known as: TOPROL-XL Take 1 tablet (25 mg total) by mouth daily.   pantoprazole 40 MG tablet Commonly known as: PROTONIX Take 1 tablet (40 mg total) by mouth daily.    prochlorperazine 10 MG tablet Commonly known as: COMPAZINE Take 1 tablet (10 mg total) by mouth every 8 (eight) hours as needed for nausea or vomiting.   saccharomyces boulardii 250 MG capsule Commonly known as: FLORASTOR Take 1 capsule (250 mg total) by mouth 2 (two) times daily.        Follow-up Information     Fisher, Kirstie Peri, MD. Call.   Specialty: Family Medicine Why: for post hospital follow up Contact information: [address] [place] [address] [phone]         Minna Merritts, MD. Call.   Specialty: Cardiology Why: for follow up appointment Contact information: [place]  [address] [place] [address] [phone]         Murlean Iba, MD. Call.   Specialty: Nephrology Why: follow up appointment Contact information: [place] [address] [phone]         Jamse Arn, MD Follow up.   Specialty: Physical Medicine and Rehabilitation Why: office will call  Contact information: [place] [address] [phone]         Dialysis, [facility] Follow up on 07/25/2020.   Why: appointment at 11 am  Contact information: [address] [place] [address] [phone]                 Signed: Bary Leriche 07/24/2020, 10:43 AM Patient was seen, face-face, and physical exam performed by me on day of discharge, greater than 30 minutes of total time spent.. Please see progress note from day of discharge as well.  Delice Lesch, MD, ABPMR

## 2020-07-24 NOTE — Discharge Summary (Incomplete)
Physician Discharge Summary  Patient ID: Amy [name] MRN: [id number] DOB/AGE: 1951/04/24 70 y.o.  Admit date: 07/04/2020 Discharge date: 07/24/2020  Discharge Diagnoses:  Principal Problem:   Debility Active Problems:   Hypoalbuminemia due to protein-calorie malnutrition (HCC)   Nausea and vomiting   Chronic combined systolic and diastolic CHF (congestive heart failure) (HCC)   Anemia of chronic disease   Seizures (HCC)   Chronic pain syndrome   Nausea   Prolonged Q-T interval on ECG   Chronic combined systolic and diastolic congestive heart failure (HCC)   Gastroparesis   Orthostasis   Discharged Condition: {condition:18240}  Significant Diagnostic Studies: DG Chest 2 View  Result Date: 07/23/2020 CLINICAL DATA:  Fever, EXAM: CHEST - 2 VIEW COMPARISON:  06/22/2020 FINDINGS: Lungs are well expanded, symmetric, and clear. No pneumothorax or pleural effusion. Cardiac size within normal limits. Pulmonary vascularity is normal. Osseous structures are age-appropriate. No acute bone abnormality. IMPRESSION: No active cardiopulmonary disease. Electronically Signed   By: Fidela Salisbury MD   On: 07/23/2020 17:43   DG Abd 1 View  Result Date: 07/09/2020 CLINICAL DATA:  Nausea.  Dialysis. EXAM: ABDOMEN - 1 VIEW COMPARISON:  CT 09/26/2016. FINDINGS: Peritoneal dialysis catheter noted. No bowel distention. Oral contrast noted throughout the colon. No free air. Undigested pill fragment noted in the stomach. Severe scoliosis and degenerative changes lumbar spine. Postsurgical changes right hip. Visceral vascular calcification. Bibasilar atelectasis and or scarring. Tiny bilateral pleural effusions cannot be excluded. IMPRESSION: 1. Peritoneal dialysis catheter noted. No bowel distention. Oral contrast noted throughout the colon. No acute abnormality identified. 2. Bibasilar atelectasis and or scarring. Tiny bilateral pleural effusions cannot be excluded. Electronically Signed   By: Marcello Moores  Register    On: 07/09/2020 10:49   CARDIAC CATHETERIZATION  Result Date: 06/27/2020 Successful OCT guided PCI of severe complex stenosis in the proximal LAD, reducing 90% stenosis to 0% with a 2.75x22 mm Resolute Onyx DES OCT findings: Lesion MLA 1.65 square mm, minimal calcification Post-PCI: Minimal stent area 4.93 square mm, full stent expansion and apposition  CARDIAC CATHETERIZATION  Result Date: 06/25/2020  Mid Cx lesion is 50% stenosed.  Dist Cx lesion is 50% stenosed.  Ramus lesion is 85% stenosed.  Prox LAD to Mid LAD lesion is 85% stenosed.  Mid LAD-1 lesion is 95% stenosed.  Mid LAD-2 lesion is 80% stenosed.  1st Diag lesion is 90% stenosed.  Severe multivessel CAD with diffuse 80%, 95% and 80% proximal LAD stenosis, 90% diagonal stenosis;  85% focal ramus intermediate stenosis;  and 50% stenoses in the AV groove circumflex. The right coronary artery is a dominant normal vessel. LVEDP 8 mmHg RECOMMENDATION: With severe LV dysfunction with EF of 25% and diffuse artery disease recommend initial surgical consultation consideration of CABG revascularization.  If we will to perform surgery, then will need an interventional approach to the LAD and ramus.  Will review with colleagues.   VAS [place] UPPER EXTREMITY ARTERIAL DUPLEX  Result Date: 06/30/2020 UPPER EXTREMITY DUPLEX STUDY Indications: Patient complains of Hematoma. History:     Patient has a history of catheterization via right radial artery.  Comparison Study: No prior study on file Performing Technologist: Sharion Dove RVS  Examination Guidelines: A complete evaluation includes B-mode imaging, spectral Doppler, color Doppler, and power Doppler as needed of all accessible portions of each vessel. Bilateral testing is considered an integral part of a complete examination. Limited examinations for reoccurring indications may be performed as noted.   Summary:  Right: No evidence of  pseudoaneurysm or AV fistula noted in the        right upper  extremity. *See table(s) above for measurements and observations. Electronically signed by Servando Snare MD on 06/30/2020 at 6:01:13 PM.    Final    VAS US DOPPLER PRE CABG  Result Date: 06/26/2020 PREOPERATIVE VASCULAR EVALUATION  Indications:      Pre-CABG. Risk Factors:     Hypertension, hyperlipidemia, coronary artery disease. Comparison Study: No prior study Performing Technologist: Sharion Dove RVS  Examination Guidelines: A complete evaluation includes B-mode imaging, spectral Doppler, color Doppler, and power Doppler as needed of all accessible portions of each vessel. Bilateral testing is considered an integral part of a complete examination. Limited examinations for reoccurring indications may be performed as noted.  Right Carotid Findings: +----------+--------+--------+--------+------------+------------------+           PSV cm/sEDV cm/sStenosisDescribe    Comments           +----------+--------+--------+--------+------------+------------------+ CCA Prox  79      25                          intimal thickening +----------+--------+--------+--------+------------+------------------+ CCA Distal62      25                          intimal thickening +----------+--------+--------+--------+------------+------------------+ ICA Prox  63      17              heterogenous                   +----------+--------+--------+--------+------------+------------------+ ICA Distal77      31                                             +----------+--------+--------+--------+------------+------------------+ ECA       48      11                                             +----------+--------+--------+--------+------------+------------------+ Portions of this table do not appear on this page. +----------+--------+-------+--------+------------+           PSV cm/sEDV cmsDescribeArm Pressure +----------+--------+-------+--------+------------+ Subclavian59                                   +----------+--------+-------+--------+------------+ +---------+--------+--+--------+--+ VertebralPSV cm/s70EDV cm/s22 +---------+--------+--+--------+--+ Left Carotid Findings: +----------+--------+--------+--------+------------+------------------+           PSV cm/sEDV cm/sStenosisDescribe    Comments           +----------+--------+--------+--------+------------+------------------+ CCA Prox  72      20                          intimal thickening +----------+--------+--------+--------+------------+------------------+ CCA Distal53      18                          intimal thickening +----------+--------+--------+--------+------------+------------------+ ICA Prox  61      30              heterogenous                   +----------+--------+--------+--------+------------+------------------+  ICA Distal83      34                                             +----------+--------+--------+--------+------------+------------------+ ECA       46      10                                             +----------+--------+--------+--------+------------+------------------+ +----------+--------+--------+--------+------------+ SubclavianPSV cm/sEDV cm/sDescribeArm Pressure +----------+--------+--------+--------+------------+           83                                   +----------+--------+--------+--------+------------+ +---------+--------+--+--------+--+ VertebralPSV cm/s48EDV cm/s15 +---------+--------+--+--------+--+  ABI Findings: +--------+------------------+-----+-----------+--------+ Right   Rt Pressure (mmHg)IndexWaveform   Comment  +--------+------------------+-----+-----------+--------+ [id number]                    triphasic           +--------+------------------+-----+-----------+--------+ PTA                            multiphasic         +--------+------------------+-----+-----------+--------+ DP                              multiphasic         +--------+------------------+-----+-----------+--------+ +--------+------------------+-----+-----------+-------+ Left    Lt Pressure (mmHg)IndexWaveform   Comment +--------+------------------+-----+-----------+-------+ [id number]                    triphasic          +--------+------------------+-----+-----------+-------+ PTA                            multiphasic        +--------+------------------+-----+-----------+-------+ DP                             multiphasic        +--------+------------------+-----+-----------+-------+  Right Doppler Findings: +-----------+--------+-----+-----------+---------------------------------------+ Site       PressureIndexDoppler    Comments                                +-----------+--------+-----+-----------+---------------------------------------+ Brachial   117          triphasic                                          +-----------+--------+-----+-----------+---------------------------------------+ Radial                  multiphasic                                        +-----------+--------+-----+-----------+---------------------------------------+ Ulnar                   multiphasic                                        +-----------+--------+-----+-----------+---------------------------------------+  Palmar Arch                        Doppler signal reverses with both                                          radial and ulnar compression            +-----------+--------+-----+-----------+---------------------------------------+  Left Doppler Findings: +--------+--------+-----+-----------+--------+ Site    PressureIndexDoppler    Comments +--------+--------+-----+-----------+--------+ [id number]          triphasic           +--------+--------+-----+-----------+--------+ Radial               multiphasic         +--------+--------+-----+-----------+--------+ Ulnar                 multiphasic         +--------+--------+-----+-----------+--------+  Summary: Right Carotid: The extracranial vessels were near-normal with only minimal wall                thickening or plaque. Left Carotid: The extracranial vessels were near-normal with only minimal wall               thickening or plaque. Vertebrals:  Bilateral vertebral arteries demonstrate antegrade flow. Subclavians: Normal flow hemodynamics were seen in bilateral subclavian              arteries. Right Upper Extremity: Doppler waveforms remain within normal limits with right radial compression. Doppler waveforms remain within normal limits with right ulnar compression. Left Upper Extremity: Doppler waveform obliterate with left radial compression. Doppler waveforms remain within normal limits with left ulnar compression.  Electronically signed by Jamelle Haring on 06/26/2020 at 8:11:44 PM.    Final    VAS [place] LOWER EXTREMITY VENOUS (DVT)  Result Date: 06/24/2020  Lower Venous DVT Study Other Indications: D-DImer. Risk Factors: None identified. Comparison Study: 5/18 Negative Performing Technologist: Vonzell Schlatter RVT  Examination Guidelines: A complete evaluation includes B-mode imaging, spectral Doppler, color Doppler, and power Doppler as needed of all accessible portions of each vessel. Bilateral testing is considered an integral part of a complete examination. Limited examinations for reoccurring indications may be performed as noted. The reflux portion of the exam is performed with the patient in reverse Trendelenburg.  +---------+---------------+---------+-----------+----------+--------------+ RIGHT    CompressibilityPhasicitySpontaneityPropertiesThrombus Aging +---------+---------------+---------+-----------+----------+--------------+ CFV      Full           Yes      Yes                                 +---------+---------------+---------+-----------+----------+--------------+ SFJ      Full                                                         +---------+---------------+---------+-----------+----------+--------------+ FV Prox  Full                                                        +---------+---------------+---------+-----------+----------+--------------+  FV Mid   Full                                                        +---------+---------------+---------+-----------+----------+--------------+ FV DistalFull                                                        +---------+---------------+---------+-----------+----------+--------------+ PFV      Full                                                        +---------+---------------+---------+-----------+----------+--------------+ POP      Full           Yes      Yes                                 +---------+---------------+---------+-----------+----------+--------------+ PTV      Full                                                        +---------+---------------+---------+-----------+----------+--------------+ PERO     Full                                                        +---------+---------------+---------+-----------+----------+--------------+   +---------+---------------+---------+-----------+----------+--------------+ LEFT     CompressibilityPhasicitySpontaneityPropertiesThrombus Aging +---------+---------------+---------+-----------+----------+--------------+ CFV      Full           Yes      Yes                                 +---------+---------------+---------+-----------+----------+--------------+ SFJ      Full                                                        +---------+---------------+---------+-----------+----------+--------------+ FV Prox  Full                                                        +---------+---------------+---------+-----------+----------+--------------+ FV Mid   Full                                                         +---------+---------------+---------+-----------+----------+--------------+  FV DistalFull                                                        +---------+---------------+---------+-----------+----------+--------------+ PFV      Full                                                        +---------+---------------+---------+-----------+----------+--------------+ POP      Full           Yes      Yes                                 +---------+---------------+---------+-----------+----------+--------------+ PTV      Full                                                        +---------+---------------+---------+-----------+----------+--------------+ PERO     Full                                                        +---------+---------------+---------+-----------+----------+--------------+  Summary: RIGHT: - There is no evidence of deep vein thrombosis in the lower extremity.  - A cystic structure is found in the popliteal fossa. - wall thickening was noted in grey scale images indicating possible post-phlebitic changes  LEFT: - wall thickening was noted in greyscale indicating possible post-phlebitic changes  *See table(s) above for measurements and observations. Electronically signed by Deitra Mayo MD on 06/24/2020 at 6:29:53 PM.    Final     Labs:  Basic Metabolic Panel: Recent Labs  Lab 07/18/20 0455 07/19/20 0451 07/20/20 0447 07/21/20 1017 07/22/20 0540 07/23/20 0454 07/24/20 0452  NA 127* 126* 125* 127* 128* 127* 128*  K 3.7 3.6 3.8 3.8 4.3 4.0 3.8  CL 93* 92* 92* 94* 93* 92* 96*  CO2 24 23 18* 22 24 23  18*  GLUCOSE 86 80 80 89 83 86 84  BUN 18 17 17 17 22  24* 23  CREATININE 4.13* 4.06* 3.88* 4.00* 3.82* 3.96* 4.25*  CALCIUM 7.5* 7.5* 7.4* 7.8* 7.9* 7.9* 7.7*  PHOS 3.6 3.4 3.5 3.3 3.5 3.6 4.0    CBC: Recent Labs  Lab 07/22/20 0540 07/23/20 1816 07/24/20 0452  WBC 10.0 10.3 9.7  NEUTROABS 7.6 7.9* 7.5  HGB 12.8 12.7 12.0  HCT 37.4 37.4  34.8*  MCV 96.1 96.6 96.9  PLT 296 326 282    CBG: No results for input(s): GLUCAP in the last 168 hours.  Brief HPI:   Amy [name] is a 70 y.o. female ***   Hospital Course: Amy [name] was admitted to rehab 07/04/2020 for inpatient therapies to consist of PT, ST and OT at least three hours five days a week. Past admission physiatrist, therapy team and rehab RN have worked  together to provide customized collaborative inpatient rehab.   Blood pressures were monitored on TID basis and   Diabetes has been monitored with ac/hs CBG checks and SSI was use prn for tighter BS control.    Rehab course: During patient's stay in rehab weekly team conferences were held to monitor patient's progress, set goals and discuss barriers to discharge. At admission, patient required  He/She  has had improvement in activity tolerance, balance, postural control as well as ability to compensate for deficits. He/She has had improvement in functional use RUE/LUE  and RLE/LLE as well as improvement in awareness       Disposition:  There are no questions and answers to display.         Diet:  Special Instructions:   Allergies as of 07/24/2020      Reactions   Cephalosporins Hives, Swelling   Clarithromycin Other (See Comments)   Unknown reaction   Gabapentin    Lunesta  [eszopiclone]    Heart racing   Penicillin V    Has patient had a PCN reaction causing immediate rash, facial/tongue/throat swelling, SOB or lightheadedness with hypotension: Yes Has patient had a PCN reaction causing severe rash involving mucus membranes or skin necrosis: No Has patient had a PCN reaction that required hospitalization No Has patient had a PCN reaction occurring within the last 10 years: No If all of the above answers are "NO", then may proceed with Cephalosporin use.   Sulfa Antibiotics    Unknown reaction    Med Rec must be completed prior to using this [place]***       Follow-up Information     Dialysis, [place] Follow up on 07/25/2020.   Why: Appointment @ 11:00 for home PD therapy Contact information: [address] [place] [address] [phone]        [name], MD. Call.   Specialty: Family Medicine Why: for post hospital follow up Contact information: [address] [place] [address] [phone]        Minna Merritts, MD. Call.   Specialty: Cardiology Why: for follow up appointment Contact information: [place] [address] [phone]        Murlean Iba, MD. Call.   Specialty: Nephrology Why: follow up appointment Contact information: [place] [address] [phone]               Signed: Bary Leriche 07/24/2020, 8:58 AM

## 2020-07-24 NOTE — Progress Notes (Signed)
Patient discharged to home with husband. Discharge instructions reviewed by PA with patient and husband. No concerns at time of discharge.

## 2020-07-24 NOTE — Progress Notes (Addendum)
PROGRESS NOTE   Subjective/Complaints: Patient seen laying in bed this morning.  She states she slept well overnight.  She notes improvement in nausea.  She denies orthostatic symptoms.  She states she is ready for discharge.  Overnight she had a low-grade temperature, that has since resolved.  She denies symptoms.  Work-up was initiated.  She was seen by nephrology, notes reviewed-PD held, patient educated on blood pressure checks prior to PD.  ROS: Denies CP, SOB, nausea, vomiting, diarrhea.  Objective:   DG Chest 2 View  Result Date: 07/23/2020 CLINICAL DATA:  Fever, EXAM: CHEST - 2 VIEW COMPARISON:  06/22/2020 FINDINGS: Lungs are well expanded, symmetric, and clear. No pneumothorax or pleural effusion. Cardiac size within normal limits. Pulmonary vascularity is normal. Osseous structures are age-appropriate. No acute bone abnormality. IMPRESSION: No active cardiopulmonary disease. Electronically Signed   By: Fidela Salisbury MD   On: 07/23/2020 17:43   Recent Labs    07/23/20 1816 07/24/20 0452  WBC 10.3 9.7  HGB 12.7 12.0  HCT 37.4 34.8*  PLT 326 282   Recent Labs    07/23/20 0454 07/24/20 0452  NA 127* 128*  K 4.0 3.8  CL 92* 96*  CO2 23 18*  GLUCOSE 86 84  BUN 24* 23  CREATININE 3.96* 4.25*  CALCIUM 7.9* 7.7*    Intake/Output Summary (Last 24 hours) at 07/24/2020 1230 Last data filed at 07/23/2020 1852 Gross per 24 hour  Intake 428.91 ml  Output --  Net 428.91 ml        Physical Exam: Vital Signs Blood pressure 98/75, pulse 98, temperature 98 F (36.7 C), temperature source Oral, resp. rate 18, height 5\' 4"  (1.626 m), weight 47.8 kg, SpO2 98 %.  Constitutional: No distress . Vital signs reviewed. HENT: Normocephalic.  Atraumatic. Eyes: EOMI. No discharge. Cardiovascular: No JVD.  RRR. Respiratory: Normal effort.  No stridor.  Bilateral clear to auscultation. GI: Non-distended.  BS +.  + PD site. Skin:  Warm and dry.  Intact. Psych: Normal mood.  Normal behavior. Musc: No edema in extremities.  No tenderness in extremities. Neurologic: Alert and oriented.  Motor: 4-4+/5 throughout, stable  Assessment/Plan: 1. Functional deficits which require 3+ hours per day of interdisciplinary therapy in a comprehensive inpatient rehab setting.  Physiatrist is providing close team supervision and 24 hour management of active medical problems listed below.  Physiatrist and rehab team continue to assess barriers to discharge/monitor patient progress toward functional and medical goals  Care Tool:  Bathing    Body parts bathed by patient: Right arm,Left arm,Chest,Abdomen,Front perineal area,Right upper leg,Left upper leg,Face,Right lower leg,Left lower leg,Buttocks   Body parts bathed by helper: Buttocks     Bathing assist Assist Level: Supervision/Verbal cueing     Upper Body Dressing/Undressing Upper body dressing   What is the patient wearing?: Pull over shirt    Upper body assist Assist Level: Set up assist    Lower Body Dressing/Undressing Lower body dressing      What is the patient wearing?: Underwear/pull up,Pants     Lower body assist Assist for lower body dressing: Supervision/Verbal cueing     Toileting Toileting    Toileting assist  Assist for toileting: Supervision/Verbal cueing     Transfers Chair/bed transfer  Transfers assist     Chair/bed transfer assist level: Supervision/Verbal cueing Chair/bed transfer assistive device: Programmer, multimedia   Ambulation assist      Assist level: Supervision/Verbal cueing Assistive device: Walker-rolling Max distance: 34ft   Walk 10 feet activity   Assist  Walk 10 feet activity did not occur: Safety/medical concerns  Assist level: Supervision/Verbal cueing Assistive device: Walker-rolling   Walk 50 feet activity   Assist Walk 50 feet with 2 turns activity did not occur: Safety/medical  concerns  Assist level: Supervision/Verbal cueing Assistive device: Walker-rolling    Walk 150 feet activity   Assist Walk 150 feet activity did not occur: Safety/medical concerns  Assist level: Supervision/Verbal cueing Assistive device: Walker-rolling    Walk 10 feet on uneven surface  activity   Assist Walk 10 feet on uneven surfaces activity did not occur: Safety/medical concerns         Wheelchair     Assist Will patient use wheelchair at discharge?: No Type of Wheelchair: Manual Wheelchair activity did not occur: Safety/medical concerns  Wheelchair assist level: Minimal Assistance - Patient > 75% Max wheelchair distance: 120'    Wheelchair 50 feet with 2 turns activity    Assist    Wheelchair 50 feet with 2 turns activity did not occur: Safety/medical concerns   Assist Level: Minimal Assistance - Patient > 75%   Wheelchair 150 feet activity     Assist  Wheelchair 150 feet activity did not occur: Safety/medical concerns       Medical Problem List and Plan: 1.Functional and mobility deficitssecondary to debility after multiple medical issues-CHF, hx of C diff colitis, recent R hip fx 12/21 with ORIF  DC today  Will see patient for hospital follow-up in 1 month post-discharge 2. Antithrombotics: -DVT/anticoagulation:Mechanical:Antiembolism stockings, knee (TED hose) Bilateral lower extremities Sequential compression devices, below kneeBilateral lower extremities -antiplatelet therapy: ASA/plavix 3.Chronic daily frequent HA/Pain Management:tylenol for basic pain -fioricet for breakthrough headaches (on this PTA)  Controlled with meds on 3/24 4. Mood:fairly positive. Team to provide ego support as needed -antipsychotic agents: n/a 5. Neuropsych: This patientiscapable of making decisions on herown behalf. 6. Skin/Wound Care:Routine pressure relief measures. 7.  Fluids/Electrolytes/Nutrition:Monitor I/Os.  8.  ESRD: Continue daily PD per nephro 9. CAD s/p PCI: On ASA/Plavix, metoprolol and lipitor. Monitor for symptoms with increase in activity.  10. New onset seizures: Continue Keppra bid Off Wellbutrin. Monitor for anxiety symptoms.  No breakthrough seizures since admission to CIR-3/24 11. H/o A flutter: Monitor HR tid--continue metoprolol. 12. Anemia of chronic disease: Monitor with serial H/H--improved post transfusion.  On Aranesp weekly  Hemoglobin 12.0 on 3/24  Hemoccult negative   Continue to monitor 13. C diff colitis: Completed treatment with Dificid on 02/27 -had one watery stool 3/18 followed by small and then smear mushy stool. Don't think we need to check stool for c diff.    Added probiotic 14. Chronic combined CHF: Takatsubo CM with EF--25-30% -- Monitor weights daily.  Demadex d/ced per Nephro  D/ced KDUR given d/c of demedex  Metoprolol and statin. Filed Weights   07/22/20 1745 07/23/20 0900 07/24/20 0520  Weight: 41.1 kg 41.6 kg 47.8 kg   Monitor closed given IVF  ?  Reliability on 3/24 15.  Nausea, recent C. difficile colitis, end-stage renal disease, gastroparesis  Suspect this is chronic given PD, electrolyte abnormalities, confirmed by daughter.  However, patient states this is new.  KUB ordered, showing oral contrast throughout the colon, otherwise relatively unremarkable  Continue Reglan 5 BID, repeat ECG showing persistent prolonged QTC, DCed Reglan given improvement, restarted on 3/23 given recurrent symptoms  Continue PPI-history of ulcer  -prn compazine   Follow up with GI as outpt for gastroparesis  Improving 16.  Sleep disturbance   Continue melatonin scheduled and ambien prn  -some improvement 17.  Hypoalbuminemia  Supplement initiated 18.  Prolonged QTC  See #15  Zofran DC'd--patient states ineffective anyway. 19.   Hypokalemia  Potassium 4.3 on 3/22, labs ordered for tomorrow  Continue per renal recs 20. Symptomatic orthostasis  See #8, #14  Improving on 3/24 21.  Low-grade fever-resolved  Chest x-ray unremarkable  UA unremarkable, urine culture pending  Blood cultures pending  PD fluid analysis pending  No leukocytosis  > 30 minutes spent in total in discharge planning between myself and PA regarding aforementioned, as well discussion regarding DME equipment, follow-up appointments, follow-up therapies, discharge medications, discharge recommendations, reviewing labs, answering questions.  Please see discharge summary as well.  LOS: 20 days A FACE TO FACE EVALUATION WAS PERFORMED  Suyash Amory Lorie Phenix 07/24/2020, 12:30 PM

## 2020-07-24 NOTE — Plan of Care (Signed)
  Problem: RH Balance Goal: LTG Patient will maintain dynamic sitting balance (PT) Description: LTG:  Patient will maintain dynamic sitting balance with assistance during mobility activities (PT) Outcome: Completed/Met Goal: LTG Patient will maintain dynamic standing balance (PT) Description: LTG:  Patient will maintain dynamic standing balance with assistance during mobility activities (PT) Outcome: Completed/Met   Problem: Sit to Stand Goal: LTG:  Patient will perform sit to stand with assistance level (PT) Description: LTG:  Patient will perform sit to stand with assistance level (PT) Outcome: Completed/Met   Problem: RH Bed Mobility Goal: LTG Patient will perform bed mobility with assist (PT) Description: LTG: Patient will perform bed mobility with assistance, with/without cues (PT). Outcome: Completed/Met   Problem: RH Bed to Chair Transfers Goal: LTG Patient will perform bed/chair transfers w/assist (PT) Description: LTG: Patient will perform bed to chair transfers with assistance (PT). Outcome: Completed/Met   Problem: RH Car Transfers Goal: LTG Patient will perform car transfers with assist (PT) Description: LTG: Patient will perform car transfers with assistance (PT). Outcome: Completed/Met   Problem: RH Ambulation Goal: LTG Patient will ambulate in controlled environment (PT) Description: LTG: Patient will ambulate in a controlled environment, # of feet with assistance (PT). Outcome: Completed/Met Goal: LTG Patient will ambulate in home environment (PT) Description: LTG: Patient will ambulate in home environment, # of feet with assistance (PT). Outcome: Completed/Met   Problem: RH Stairs Goal: LTG Patient will ambulate up and down stairs w/assist (PT) Description: LTG: Patient will ambulate up and down # of stairs with assistance (PT) Outcome: Completed/Met  Magda Kiel, PT

## 2020-07-24 NOTE — Progress Notes (Signed)
Subjective: No [name] last night secondary to volume issues lab okay.  No complaints this a.m. noted BP better.  Husband in room noted for discharge today.  Noted temperature spike yesterday 100.0 orally afebrile since, PD fluid clear no significant elevated WBC, UA okay chest x-ray NAD.  Patient states she will check her blood pressure in the evening before starting [name] planning to use all 1.5/unless SBP's  Elevated >140.  Think she has a follow-up appoint with her nephrology group tomorrow she also discussed plans with them  Objective Vital signs in last 24 hours: Vitals:   07/23/20 2337 07/24/20 0520 07/24/20 1015 07/24/20 1020  BP:  117/72 103/68 98/75  Pulse:  95 92 98  Resp: 16 18    Temp: 98 F (36.7 C) 98 F (36.7 C)    TempSrc:  Oral    SpO2: 99% 98%    Weight:  47.8 kg    Height:       Weight change: 0.5 kg  Physical Exam: General:Alert elderly female, currently pleasant, NAD Heart:RRR no MRG Lungs:CTA nonlabored room air Abdomen:Positive BS, soft NTND PD site stable Extremities:PD cath in right lower quadrant nontender dressing dry Dialysis Access:No pedal edema  Dialysis Orders: [facility] followed by Dr. Candiss Norse. 4 exchanges overnight, 2000 cc and 1.5 hrs each.   Assessment/Plan:  1. Multivessel CAD with reduced EF 25/30%: Eval by [facility]CTS, is"poor CABG candidate". s/pPCI to LAD with DESon 2/25.gettingPlavix and ASA 81 mg,+ statin 2. Debility: 2/2 Multiple med issues mainly CAD>now on CIR, will discharge today 3. ESRD - ON  [name]. Stable.With chronic hyponatremiareviewing past records .  No [name] last night secondary to volume low AND labs okay /a.m. K 3.8 .  Per husband has follow-up 4 months tomorrow with Her nephrology group in [facility] 4. History of hyponatremia chronic= NA 128, reviewing past records has history of hyponatremia asymptomatic  5. Hypokalemia:A.m. K3.8potassium on38mEq TID, potassium level now at  goal. 6. HTN/volume -yesterday BP low a.m. given  IV fluids, no [name] last night a.m. BP 117/72 ,on metoprolol 25 mg daily.  7. Anemiaof ESRD-Hgb9.7 (3/18) , 12.8( 3/22) today 12.0 hgb  ?related to dehydration, can hold Aranesp if still up. s/p 1unitPRBCon 2/20 and on 2/27. Aranesp 100 MCG q Saturday. tsat 91%,ferritin 1131. No need for iron at this time. 8. Secondary hyperparathyroidism: Corrected calcium okay ,Phos 4.0  no binders. Was on  Sensipar 30mg   have DC with her nausea,//stoppedRocaltrol with[pastelevatedcorrectedcalcium>10, Follow-up lab letter outpatient 9. Nutrition: Albumin low 1.4<1.6. (Related to PD and decrease protein intake )on regular diet with prot supp.  10. Seizures - do to electrolyte abnormality in setting of C diff. Now on Keppra per neuro. No breakthrough seizures since admission to CIR  11. C diff - finished ABX course on 2/27. 12. Disposition-CIR admit for rehab 13. Nausea-resolved this a.m. probable etiology= low BP and some medications no evidence of peritonitis. Corrected electrolyte abnormalities as above and has compazine PRN ordered  Ernest Haber, PA-C [facility] Beeper [phone] 07/24/2020,10:56 AM  LOS: 20 days   Labs: Basic Metabolic Panel: Recent Labs  Lab 07/22/20 0540 07/23/20 0454 07/24/20 0452  NA 128* 127* 128*  K 4.3 4.0 3.8  CL 93* 92* 96*  CO2 24 23 18*  GLUCOSE 83 86 84  BUN 22 24* 23  CREATININE 3.82* 3.96* 4.25*  CALCIUM 7.9* 7.9* 7.7*  PHOS 3.5 3.6 4.0   Liver Function Tests: Recent Labs  Lab 07/22/20 0540 07/23/20  4801 07/24/20 0452  ALBUMIN 1.6* 1.6* 1.4*   No results for input(s): LIPASE, AMYLASE in the last 168 hours. No results for input(s): AMMONIA in the last 168 hours. CBC: Recent Labs  Lab 07/18/20 0455 07/22/20 0540 07/23/20 1816 07/24/20 0452  WBC 6.5 10.0 10.3 9.7  NEUTROABS  --  7.6 7.9* 7.5  HGB 9.7* 12.8 12.7 12.0  HCT 28.5* 37.4 37.4 34.8*  MCV 96.6 96.1 96.6  96.9  PLT 215 296 326 282   Cardiac Enzymes: No results for input(s): CKTOTAL, CKMB, CKMBINDEX, TROPONINI in the last 168 hours. CBG: No results for input(s): GLUCAP in the last 168 hours.  Studies/Results: DG Chest 2 View  Result Date: 07/23/2020 CLINICAL DATA:  Fever, EXAM: CHEST - 2 VIEW COMPARISON:  06/22/2020 FINDINGS: Lungs are well expanded, symmetric, and clear. No pneumothorax or pleural effusion. Cardiac size within normal limits. Pulmonary vascularity is normal. Osseous structures are age-appropriate. No acute bone abnormality. IMPRESSION: No active cardiopulmonary disease. Electronically Signed   By: Fidela Salisbury MD   On: 07/23/2020 17:43   Medications: . dialysis solution 1.5% low-MG/low-CA     . (feeding supplement) PROSource Plus  30 mL Oral BID BM  . aspirin  81 mg Oral Daily  . atorvastatin  80 mg Oral q1800  . Chlorhexidine Gluconate Cloth  6 each Topical Daily  . clopidogrel  75 mg Oral Daily  . darbepoetin (ARANESP) injection - NON-DIALYSIS  100 mcg Subcutaneous Q Sat-1800  . feeding supplement  1 Container Oral TID BM  . feeding supplement (NEPRO CARB STEADY)  237 mL Oral TID WC  . gentamicin cream  1 application Topical Daily  . levETIRAcetam  500 mg Oral BID  . melatonin  3 mg Oral QHS  . metoCLOPramide  5 mg Oral Q0600  . metoprolol succinate  25 mg Oral Daily  . pantoprazole  40 mg Oral Daily  . saccharomyces boulardii  250 mg Oral BID

## 2020-07-24 NOTE — Discharge Instructions (Signed)
Inpatient Rehab Discharge Instructions  Amy [name] Discharge date and time: 07/24/20   Activities/Precautions/ Functional Status: Activity: activity as tolerated and no driving.  Diet: cardiac diet Wound Care:  Routine PD catheter care.   Functional status:  ___ No restrictions     ___ Walk up steps independently _X__ 24/7 supervision/assistance   ___ Walk up steps with assistance ___ Intermittent supervision/assistance  ___ Bathe/dress independently ___ Walk with walker     _X__ Bathe/dress with assistance ___ Walk Independently    ___ Shower independently ___ Walk with assistance    ___ Shower with assistance _X__ No alcohol     ___ Return to work/school ________   Special Instructions:    COMMUNITY REFERRALS UPON DISCHARGE:    Home Health:   PT & OT                 [place] Phone:[phone]    Medical Equipment/Items Ordered:Has all equipment from past surgeries                                                     My questions have been answered and I understand these instructions. I will adhere to these goals and the provided educational materials after my discharge from the hospital.  Patient/Caregiver Signature _______________________________ Date __________  Clinician Signature _______________________________________ Date __________  Please bring this form and your medication list with you to all your follow-up doctor's appointments.     COMMUNITY REFERRALS UPON DISCHARGE:    Home Health:   PT, OT                 [place] Phone:[phone]   Medical Equipment/Items Ordered:HAS ALL NEEDED EQUIPMENT FROM PAST ADMISSIONS                                                 Agency/Supplier:NA

## 2020-07-25 ENCOUNTER — Other Ambulatory Visit: Payer: Self-pay

## 2020-07-25 ENCOUNTER — Emergency Department
Admission: EM | Admit: 2020-07-25 | Discharge: 2020-07-25 | Disposition: A | Discharge status: Other Acute Inpatient Hospital | Payer: Medicare Other | Attending: Emergency Medicine | Admitting: Emergency Medicine

## 2020-07-25 ENCOUNTER — Emergency Department: Payer: Medicare Other

## 2020-07-25 DIAGNOSIS — M4802 Spinal stenosis, cervical region: Secondary | ICD-10-CM | POA: Diagnosis present

## 2020-07-25 DIAGNOSIS — S065XAA Traumatic subdural hemorrhage with loss of consciousness status unknown, initial encounter: Secondary | ICD-10-CM

## 2020-07-25 DIAGNOSIS — Z992 Dependence on renal dialysis: Secondary | ICD-10-CM | POA: Insufficient documentation

## 2020-07-25 DIAGNOSIS — E0781 Sick-euthyroid syndrome: Secondary | ICD-10-CM | POA: Diagnosis present

## 2020-07-25 DIAGNOSIS — Z452 Encounter for adjustment and management of vascular access device: Secondary | ICD-10-CM | POA: Diagnosis not present

## 2020-07-25 DIAGNOSIS — I672 Cerebral atherosclerosis: Secondary | ICD-10-CM | POA: Diagnosis not present

## 2020-07-25 DIAGNOSIS — I469 Cardiac arrest, cause unspecified: Secondary | ICD-10-CM | POA: Diagnosis not present

## 2020-07-25 DIAGNOSIS — D638 Anemia in other chronic diseases classified elsewhere: Secondary | ICD-10-CM | POA: Diagnosis present

## 2020-07-25 DIAGNOSIS — R1310 Dysphagia, unspecified: Secondary | ICD-10-CM | POA: Diagnosis not present

## 2020-07-25 DIAGNOSIS — G9389 Other specified disorders of brain: Secondary | ICD-10-CM | POA: Diagnosis not present

## 2020-07-25 DIAGNOSIS — W19XXXA Unspecified fall, initial encounter: Secondary | ICD-10-CM | POA: Diagnosis not present

## 2020-07-25 DIAGNOSIS — K219 Gastro-esophageal reflux disease without esophagitis: Secondary | ICD-10-CM | POA: Diagnosis present

## 2020-07-25 DIAGNOSIS — E8809 Other disorders of plasma-protein metabolism, not elsewhere classified: Secondary | ICD-10-CM | POA: Diagnosis not present

## 2020-07-25 DIAGNOSIS — Z4682 Encounter for fitting and adjustment of non-vascular catheter: Secondary | ICD-10-CM | POA: Diagnosis not present

## 2020-07-25 DIAGNOSIS — Z681 Body mass index (BMI) 19 or less, adult: Secondary | ICD-10-CM | POA: Diagnosis not present

## 2020-07-25 DIAGNOSIS — S065X3S Traumatic subdural hemorrhage with loss of consciousness of 1 hour to 5 hours 59 minutes, sequela: Secondary | ICD-10-CM | POA: Diagnosis not present

## 2020-07-25 DIAGNOSIS — S299XXA Unspecified injury of thorax, initial encounter: Secondary | ICD-10-CM | POA: Diagnosis not present

## 2020-07-25 DIAGNOSIS — Z880 Allergy status to penicillin: Secondary | ICD-10-CM | POA: Diagnosis not present

## 2020-07-25 DIAGNOSIS — R059 Cough, unspecified: Secondary | ICD-10-CM | POA: Diagnosis not present

## 2020-07-25 DIAGNOSIS — D649 Anemia, unspecified: Secondary | ICD-10-CM | POA: Diagnosis not present

## 2020-07-25 DIAGNOSIS — G479 Sleep disorder, unspecified: Secondary | ICD-10-CM | POA: Diagnosis not present

## 2020-07-25 DIAGNOSIS — E785 Hyperlipidemia, unspecified: Secondary | ICD-10-CM | POA: Diagnosis present

## 2020-07-25 DIAGNOSIS — R569 Unspecified convulsions: Secondary | ICD-10-CM | POA: Diagnosis not present

## 2020-07-25 DIAGNOSIS — Z7902 Long term (current) use of antithrombotics/antiplatelets: Secondary | ICD-10-CM | POA: Insufficient documentation

## 2020-07-25 DIAGNOSIS — I82612 Acute embolism and thrombosis of superficial veins of left upper extremity: Secondary | ICD-10-CM | POA: Diagnosis not present

## 2020-07-25 DIAGNOSIS — N186 End stage renal disease: Secondary | ICD-10-CM | POA: Diagnosis not present

## 2020-07-25 DIAGNOSIS — R4020 Unspecified coma: Secondary | ICD-10-CM | POA: Diagnosis not present

## 2020-07-25 DIAGNOSIS — S065X3D Traumatic subdural hemorrhage with loss of consciousness of 1 hour to 5 hours 59 minutes, subsequent encounter: Secondary | ICD-10-CM | POA: Diagnosis not present

## 2020-07-25 DIAGNOSIS — I1311 Hypertensive heart and chronic kidney disease without heart failure, with stage 5 chronic kidney disease, or end stage renal disease: Secondary | ICD-10-CM | POA: Diagnosis not present

## 2020-07-25 DIAGNOSIS — S3992XA Unspecified injury of lower back, initial encounter: Secondary | ICD-10-CM | POA: Diagnosis not present

## 2020-07-25 DIAGNOSIS — Z8616 Personal history of COVID-19: Secondary | ICD-10-CM | POA: Diagnosis not present

## 2020-07-25 DIAGNOSIS — Z7982 Long term (current) use of aspirin: Secondary | ICD-10-CM | POA: Insufficient documentation

## 2020-07-25 DIAGNOSIS — R5381 Other malaise: Secondary | ICD-10-CM | POA: Diagnosis not present

## 2020-07-25 DIAGNOSIS — K3184 Gastroparesis: Secondary | ICD-10-CM | POA: Diagnosis not present

## 2020-07-25 DIAGNOSIS — I959 Hypotension, unspecified: Secondary | ICD-10-CM | POA: Diagnosis not present

## 2020-07-25 DIAGNOSIS — I5032 Chronic diastolic (congestive) heart failure: Secondary | ICD-10-CM | POA: Diagnosis present

## 2020-07-25 DIAGNOSIS — I132 Hypertensive heart and chronic kidney disease with heart failure and with stage 5 chronic kidney disease, or end stage renal disease: Secondary | ICD-10-CM | POA: Insufficient documentation

## 2020-07-25 DIAGNOSIS — G40009 Localization-related (focal) (partial) idiopathic epilepsy and epileptic syndromes with seizures of localized onset, not intractable, without status epilepticus: Secondary | ICD-10-CM | POA: Diagnosis not present

## 2020-07-25 DIAGNOSIS — Z955 Presence of coronary angioplasty implant and graft: Secondary | ICD-10-CM | POA: Diagnosis not present

## 2020-07-25 DIAGNOSIS — W19XXXD Unspecified fall, subsequent encounter: Secondary | ICD-10-CM | POA: Diagnosis present

## 2020-07-25 DIAGNOSIS — I5030 Unspecified diastolic (congestive) heart failure: Secondary | ICD-10-CM | POA: Diagnosis not present

## 2020-07-25 DIAGNOSIS — Y92239 Unspecified place in hospital as the place of occurrence of the external cause: Secondary | ICD-10-CM | POA: Diagnosis not present

## 2020-07-25 DIAGNOSIS — J9600 Acute respiratory failure, unspecified whether with hypoxia or hypercapnia: Secondary | ICD-10-CM | POA: Diagnosis not present

## 2020-07-25 DIAGNOSIS — R4701 Aphasia: Secondary | ICD-10-CM | POA: Diagnosis not present

## 2020-07-25 DIAGNOSIS — Z9911 Dependence on respirator [ventilator] status: Secondary | ICD-10-CM | POA: Diagnosis not present

## 2020-07-25 DIAGNOSIS — D689 Coagulation defect, unspecified: Secondary | ICD-10-CM | POA: Diagnosis not present

## 2020-07-25 DIAGNOSIS — D631 Anemia in chronic kidney disease: Secondary | ICD-10-CM | POA: Insufficient documentation

## 2020-07-25 DIAGNOSIS — E872 Acidosis: Secondary | ICD-10-CM | POA: Diagnosis not present

## 2020-07-25 DIAGNOSIS — Z7401 Bed confinement status: Secondary | ICD-10-CM | POA: Diagnosis not present

## 2020-07-25 DIAGNOSIS — S065X9A Traumatic subdural hemorrhage with loss of consciousness of unspecified duration, initial encounter: Secondary | ICD-10-CM | POA: Diagnosis not present

## 2020-07-25 DIAGNOSIS — Y33XXXA Other specified events, undetermined intent, initial encounter: Secondary | ICD-10-CM | POA: Diagnosis not present

## 2020-07-25 DIAGNOSIS — D72829 Elevated white blood cell count, unspecified: Secondary | ICD-10-CM | POA: Diagnosis not present

## 2020-07-25 DIAGNOSIS — S065X9D Traumatic subdural hemorrhage with loss of consciousness of unspecified duration, subsequent encounter: Secondary | ICD-10-CM | POA: Diagnosis not present

## 2020-07-25 DIAGNOSIS — R570 Cardiogenic shock: Secondary | ICD-10-CM | POA: Diagnosis not present

## 2020-07-25 DIAGNOSIS — R918 Other nonspecific abnormal finding of lung field: Secondary | ICD-10-CM | POA: Diagnosis not present

## 2020-07-25 DIAGNOSIS — R40243 Glasgow coma scale score 3-8, unspecified time: Secondary | ICD-10-CM | POA: Insufficient documentation

## 2020-07-25 DIAGNOSIS — G8918 Other acute postprocedural pain: Secondary | ICD-10-CM | POA: Diagnosis not present

## 2020-07-25 DIAGNOSIS — I9589 Other hypotension: Secondary | ICD-10-CM | POA: Diagnosis not present

## 2020-07-25 DIAGNOSIS — I5042 Chronic combined systolic (congestive) and diastolic (congestive) heart failure: Secondary | ICD-10-CM | POA: Diagnosis not present

## 2020-07-25 DIAGNOSIS — I6201 Nontraumatic acute subdural hemorrhage: Secondary | ICD-10-CM | POA: Diagnosis not present

## 2020-07-25 DIAGNOSIS — I618 Other nontraumatic intracerebral hemorrhage: Secondary | ICD-10-CM | POA: Diagnosis not present

## 2020-07-25 DIAGNOSIS — I82411 Acute embolism and thrombosis of right femoral vein: Secondary | ICD-10-CM | POA: Diagnosis not present

## 2020-07-25 DIAGNOSIS — R471 Dysarthria and anarthria: Secondary | ICD-10-CM | POA: Diagnosis not present

## 2020-07-25 DIAGNOSIS — Z888 Allergy status to other drugs, medicaments and biological substances status: Secondary | ICD-10-CM | POA: Diagnosis not present

## 2020-07-25 DIAGNOSIS — R600 Localized edema: Secondary | ICD-10-CM | POA: Diagnosis not present

## 2020-07-25 DIAGNOSIS — I249 Acute ischemic heart disease, unspecified: Secondary | ICD-10-CM | POA: Diagnosis not present

## 2020-07-25 DIAGNOSIS — F32A Depression, unspecified: Secondary | ICD-10-CM | POA: Diagnosis not present

## 2020-07-25 DIAGNOSIS — Z4659 Encounter for fitting and adjustment of other gastrointestinal appliance and device: Secondary | ICD-10-CM | POA: Diagnosis not present

## 2020-07-25 DIAGNOSIS — I25810 Atherosclerosis of coronary artery bypass graft(s) without angina pectoris: Secondary | ICD-10-CM | POA: Diagnosis not present

## 2020-07-25 DIAGNOSIS — E43 Unspecified severe protein-calorie malnutrition: Secondary | ICD-10-CM | POA: Diagnosis not present

## 2020-07-25 DIAGNOSIS — R9431 Abnormal electrocardiogram [ECG] [EKG]: Secondary | ICD-10-CM | POA: Diagnosis not present

## 2020-07-25 DIAGNOSIS — Z800 Family history of malignant neoplasm of digestive organs: Secondary | ICD-10-CM | POA: Diagnosis not present

## 2020-07-25 DIAGNOSIS — E876 Hypokalemia: Secondary | ICD-10-CM | POA: Diagnosis not present

## 2020-07-25 DIAGNOSIS — F419 Anxiety disorder, unspecified: Secondary | ICD-10-CM | POA: Diagnosis present

## 2020-07-25 DIAGNOSIS — E46 Unspecified protein-calorie malnutrition: Secondary | ICD-10-CM | POA: Diagnosis not present

## 2020-07-25 DIAGNOSIS — R4182 Altered mental status, unspecified: Secondary | ICD-10-CM | POA: Diagnosis not present

## 2020-07-25 DIAGNOSIS — I5020 Unspecified systolic (congestive) heart failure: Secondary | ICD-10-CM | POA: Diagnosis not present

## 2020-07-25 DIAGNOSIS — U071 COVID-19: Secondary | ICD-10-CM | POA: Diagnosis not present

## 2020-07-25 DIAGNOSIS — I6200 Nontraumatic subdural hemorrhage, unspecified: Secondary | ICD-10-CM | POA: Insufficient documentation

## 2020-07-25 DIAGNOSIS — R402432 Glasgow coma scale score 3-8, at arrival to emergency department: Secondary | ICD-10-CM | POA: Diagnosis not present

## 2020-07-25 DIAGNOSIS — G935 Compression of brain: Secondary | ICD-10-CM | POA: Diagnosis not present

## 2020-07-25 DIAGNOSIS — Z79899 Other long term (current) drug therapy: Secondary | ICD-10-CM | POA: Insufficient documentation

## 2020-07-25 DIAGNOSIS — Z20822 Contact with and (suspected) exposure to covid-19: Secondary | ICD-10-CM | POA: Diagnosis present

## 2020-07-25 DIAGNOSIS — G40909 Epilepsy, unspecified, not intractable, without status epilepticus: Secondary | ICD-10-CM | POA: Diagnosis not present

## 2020-07-25 DIAGNOSIS — I120 Hypertensive chronic kidney disease with stage 5 chronic kidney disease or end stage renal disease: Secondary | ICD-10-CM | POA: Diagnosis not present

## 2020-07-25 DIAGNOSIS — S3991XA Unspecified injury of abdomen, initial encounter: Secondary | ICD-10-CM | POA: Diagnosis not present

## 2020-07-25 DIAGNOSIS — J9601 Acute respiratory failure with hypoxia: Secondary | ICD-10-CM | POA: Diagnosis not present

## 2020-07-25 DIAGNOSIS — J9602 Acute respiratory failure with hypercapnia: Secondary | ICD-10-CM | POA: Diagnosis not present

## 2020-07-25 DIAGNOSIS — I255 Ischemic cardiomyopathy: Secondary | ICD-10-CM | POA: Diagnosis present

## 2020-07-25 DIAGNOSIS — R404 Transient alteration of awareness: Secondary | ICD-10-CM | POA: Diagnosis not present

## 2020-07-25 DIAGNOSIS — E871 Hypo-osmolality and hyponatremia: Secondary | ICD-10-CM | POA: Diagnosis not present

## 2020-07-25 DIAGNOSIS — R112 Nausea with vomiting, unspecified: Secondary | ICD-10-CM | POA: Diagnosis not present

## 2020-07-25 DIAGNOSIS — E8779 Other fluid overload: Secondary | ICD-10-CM | POA: Diagnosis not present

## 2020-07-25 DIAGNOSIS — I214 Non-ST elevation (NSTEMI) myocardial infarction: Secondary | ICD-10-CM | POA: Diagnosis not present

## 2020-07-25 DIAGNOSIS — J9690 Respiratory failure, unspecified, unspecified whether with hypoxia or hypercapnia: Secondary | ICD-10-CM | POA: Diagnosis not present

## 2020-07-25 DIAGNOSIS — R519 Headache, unspecified: Secondary | ICD-10-CM | POA: Diagnosis not present

## 2020-07-25 DIAGNOSIS — J9811 Atelectasis: Secondary | ICD-10-CM | POA: Diagnosis not present

## 2020-07-25 DIAGNOSIS — I708 Atherosclerosis of other arteries: Secondary | ICD-10-CM | POA: Diagnosis not present

## 2020-07-25 DIAGNOSIS — I5022 Chronic systolic (congestive) heart failure: Secondary | ICD-10-CM | POA: Diagnosis not present

## 2020-07-25 DIAGNOSIS — S065X0A Traumatic subdural hemorrhage without loss of consciousness, initial encounter: Secondary | ICD-10-CM | POA: Diagnosis not present

## 2020-07-25 DIAGNOSIS — M47816 Spondylosis without myelopathy or radiculopathy, lumbar region: Secondary | ICD-10-CM | POA: Diagnosis present

## 2020-07-25 DIAGNOSIS — J811 Chronic pulmonary edema: Secondary | ICD-10-CM | POA: Diagnosis not present

## 2020-07-25 DIAGNOSIS — I252 Old myocardial infarction: Secondary | ICD-10-CM | POA: Diagnosis not present

## 2020-07-25 DIAGNOSIS — S06A1XA Traumatic brain compression with herniation, initial encounter: Secondary | ICD-10-CM | POA: Diagnosis not present

## 2020-07-25 DIAGNOSIS — I2510 Atherosclerotic heart disease of native coronary artery without angina pectoris: Secondary | ICD-10-CM | POA: Diagnosis present

## 2020-07-25 LAB — URINE CULTURE: Culture: <10000 — AB

## 2020-07-25 LAB — COMPREHENSIVE METABOLIC PANEL
ALT: 25 U/L (ref 0–44)
AST: 42 U/L — ABNORMAL HIGH (ref 15–41)
Albumin: 2 g/dL — ABNORMAL LOW (ref 3.5–5.0)
Alkaline Phosphatase: 159 U/L — ABNORMAL HIGH (ref 38–126)
Anion gap: 16 — ABNORMAL HIGH (ref 5–15)
BUN: 27 mg/dL — ABNORMAL HIGH (ref 8–23)
CO2: 17 mmol/L — ABNORMAL LOW (ref 22–32)
Calcium: 7.9 mg/dL — ABNORMAL LOW (ref 8.9–10.3)
Chloride: 96 mmol/L — ABNORMAL LOW (ref 98–111)
Creatinine, Ser: 4.24 mg/dL — ABNORMAL HIGH (ref 0.44–1.00)
GFR, Estimated: 11 mL/min — ABNORMAL LOW (ref >60)
Glucose, Bld: 173 mg/dL — ABNORMAL HIGH (ref 70–99)
Potassium: 3.4 mmol/L — ABNORMAL LOW (ref 3.5–5.1)
Sodium: 129 mmol/L — ABNORMAL LOW (ref 135–145)
Total Bilirubin: 0.6 mg/dL (ref 0.3–1.2)
Total Protein: 4.5 g/dL — ABNORMAL LOW (ref 6.5–8.1)

## 2020-07-25 LAB — BLOOD GAS, VENOUS
Acid-base deficit: 8.2 mmol/L — ABNORMAL HIGH (ref 0.0–2.0)
Bicarbonate: 17.1 mmol/L — ABNORMAL LOW (ref 20.0–28.0)
O2 Saturation: 58.6 %
Patient temperature: 37
pCO2, Ven: 34 mmHg — ABNORMAL LOW (ref 44.0–60.0)
pH, Ven: 7.31 (ref 7.250–7.430)
pO2, Ven: 34 mmHg (ref 32.0–45.0)

## 2020-07-25 LAB — LACTIC ACID, PLASMA
Lactic Acid, Venous: 4.2 mmol/L (ref 0.5–1.9)
Lactic Acid, Venous: 3.8 mmol/L (ref 0.5–1.9)

## 2020-07-25 LAB — CBC WITH DIFFERENTIAL/PLATELET
Abs Immature Granulocytes: 0.17 10*3/uL — ABNORMAL HIGH (ref 0.00–0.07)
Basophils Absolute: 0.1 10*3/uL (ref 0.0–0.1)
Basophils Relative: 0 %
Eosinophils Absolute: 0 10*3/uL (ref 0.0–0.5)
Eosinophils Relative: 0 %
HCT: 44.8 % (ref 36.0–46.0)
Hemoglobin: 14.9 g/dL (ref 12.0–15.0)
Immature Granulocytes: 1 %
Lymphocytes Relative: 6 %
Lymphs Abs: 1.4 10*3/uL (ref 0.7–4.0)
MCH: 32.5 pg (ref 26.0–34.0)
MCHC: 33.3 g/dL (ref 30.0–36.0)
MCV: 97.8 fL (ref 80.0–100.0)
Monocytes Absolute: 1 10*3/uL (ref 0.1–1.0)
Monocytes Relative: 5 %
Neutro Abs: 19.4 10*3/uL — ABNORMAL HIGH (ref 1.7–7.7)
Neutrophils Relative %: 88 %
Platelets: 422 10*3/uL — ABNORMAL HIGH (ref 150–400)
RBC: 4.58 MIL/uL (ref 3.87–5.11)
RDW: 20.3 % — ABNORMAL HIGH (ref 11.5–15.5)
WBC: 22.1 10*3/uL — ABNORMAL HIGH (ref 4.0–10.5)
nRBC: 0.1 % (ref 0.0–0.2)

## 2020-07-25 LAB — TYPE AND SCREEN
ABO/RH(D): O POS
Antibody Screen: NEGATIVE

## 2020-07-25 LAB — TROPONIN I (HIGH SENSITIVITY)
Troponin I (High Sensitivity): 66 ng/L — ABNORMAL HIGH (ref <18)
Troponin I (High Sensitivity): 57 ng/L — ABNORMAL HIGH (ref <18)

## 2020-07-25 LAB — ETHANOL: Alcohol, Ethyl (B): <10 mg/dL (ref <10)

## 2020-07-25 LAB — RESP PANEL BY RT-PCR (FLU A&B, COVID) ARPGX2
Influenza A by PCR: NEGATIVE
Influenza B by PCR: NEGATIVE
SARS Coronavirus 2 by RT PCR: POSITIVE — AB

## 2020-07-25 LAB — BRAIN NATRIURETIC PEPTIDE: B Natriuretic Peptide: 116.4 pg/mL — ABNORMAL HIGH (ref 0.0–100.0)

## 2020-07-25 MED ORDER — ROCURONIUM BROMIDE 50 MG/5ML IV SOLN
50.0000 mg | Freq: Once | INTRAVENOUS | Status: AC
  Administered 2020-07-25: 50 mg via INTRAVENOUS
  Filled 2020-07-25: qty 5

## 2020-07-25 MED ORDER — FENTANYL 2500MCG IN NS 250ML (10MCG/ML) PREMIX INFUSION
25.0000 ug/h | INTRAVENOUS | Status: DC
  Administered 2020-07-25: 75 ug/h via INTRAVENOUS
  Filled 2020-07-25: qty 250

## 2020-07-25 MED ORDER — SODIUM CHLORIDE 0.9 % IV SOLN: INTRAVENOUS | Status: DC

## 2020-07-25 MED ORDER — ETOMIDATE 2 MG/ML IV SOLN
20.0000 mg | Freq: Once | INTRAVENOUS | Status: AC
  Administered 2020-07-25: 20 mg via INTRAVENOUS

## 2020-07-25 MED ORDER — FENTANYL CITRATE (PF) 100 MCG/2ML IJ SOLN: 50.0000 ug | Freq: Once | INTRAMUSCULAR | Status: DC

## 2020-07-25 MED ORDER — METRONIDAZOLE IN NACL 5-0.79 MG/ML-% IV SOLN
500.0000 mg | Freq: Once | INTRAVENOUS | Status: AC
  Administered 2020-07-25: 500 mg via INTRAVENOUS
  Filled 2020-07-25: qty 100

## 2020-07-25 MED ORDER — SODIUM CHLORIDE 0.9 % IV BOLUS
500.0000 mL | Freq: Once | INTRAVENOUS | Status: AC
  Administered 2020-07-25: 500 mL via INTRAVENOUS

## 2020-07-25 MED ORDER — VANCOMYCIN HCL IN DEXTROSE 1-5 GM/200ML-% IV SOLN: 1000.0000 mg | Freq: Once | INTRAVENOUS | Status: DC

## 2020-07-25 MED ORDER — MANNITOL 25 % IV SOLN
25.0000 g | Freq: Once | INTRAVENOUS | Status: AC
  Administered 2020-07-25: 25 g via INTRAVENOUS
  Filled 2020-07-25: qty 100

## 2020-07-25 MED ORDER — SODIUM CHLORIDE 0.9 % IV SOLN: 10.0000 mL/h | Freq: Once | INTRAVENOUS | Status: DC

## 2020-07-25 MED ORDER — MIDAZOLAM HCL 2 MG/2ML IJ SOLN: 1.0000 mg | INTRAMUSCULAR | Status: DC | PRN

## 2020-07-25 MED ORDER — FENTANYL CITRATE (PF) 100 MCG/2ML IJ SOLN
50.0000 ug | INTRAMUSCULAR | Status: DC | PRN
  Administered 2020-07-25: 50 ug via INTRAVENOUS
  Filled 2020-07-25: qty 2

## 2020-07-25 MED ORDER — LEVETIRACETAM IN NACL 1000 MG/100ML IV SOLN
1000.0000 mg | Freq: Once | INTRAVENOUS | Status: AC
  Administered 2020-07-25: 1000 mg via INTRAVENOUS
  Filled 2020-07-25: qty 100

## 2020-07-25 MED ORDER — SODIUM CHLORIDE 0.9 % IV SOLN
2.0000 g | Freq: Once | INTRAVENOUS | Status: AC
  Administered 2020-07-25: 2 g via INTRAVENOUS
  Filled 2020-07-25: qty 2

## 2020-07-25 MED ORDER — FENTANYL BOLUS VIA INFUSION
25.0000 ug | INTRAVENOUS | Status: DC | PRN
  Filled 2020-07-25: qty 25

## 2020-07-25 MED ORDER — MIDAZOLAM HCL 2 MG/2ML IJ SOLN
1.0000 mg | INTRAMUSCULAR | Status: DC | PRN
Start: 2020-07-25 — End: 2020-07-25

## 2020-07-25 NOTE — ED Notes (Signed)
Lab called stating type and screen sent was hemolyzed. RN requested phlebotomy come re-collect as patient had 4 sticks in attempt to collect sample sent. Per lab staff, lab will attempt to collect sample. MD aware.

## 2020-07-25 NOTE — ED Triage Notes (Signed)
Pt d/c from [facility] today after 3 month admission for c-diff and "complications". Pt progressively less conscious at home today until family unable to wake patient at all. Pt arrives with minimal response to pain and does not wake to verbal or tactile stimuli. Hypotensive with EMS 77/50 and remains so on arrival with HR 102. Pt 98% on RA and placed on supplemental oxygen per physician. Pt with symmetric chest rise and fall. Pt will not wake to answer triage questions

## 2020-07-25 NOTE — Consult Note (Signed)
Referring Physician:  No referring provider defined for this encounter.  Primary Physician:  [name], MD  Chief Complaint:  Altered mental status  History of Present Illness: 07/25/2020 Amy [name] is a 70 y.o. female who presents with the chief complaint of altered mental status.  She is unable to participate in this history due to critical illness and intubation (level 5 qualifier).  Amy [name] was admitted to [facility] for over 30 days, discharged yesterday, for C diff colitis. During her admission, she was identified as having significant coronary artery disease.  She underwent coronary artery stenting on June 27, 2020 with placement of a drug eluting stent. She has been on dual antiplatelet therapy since that time.  She was discharged yesterday and transported home.  At home, she did have a small fall.  She did not hit her head or have LOC.  She was responsive to family, though she was somewhat fatigued and did not seem completely at cognitive baseline per her husband and daughter.  Around 6-7 pm, she asked for and took [place], after which she went to sleep very quickly.  Her husband became alarmed a couple of hours later due to her breathing, and tried to arouse her.  He was unable to do so. EMS was called, and brought her to ER where she was intubated.  CT was performed, showing large aSDH.   Review of Systems:  A 10 point review of systems is negative, except for the pertinent positives and negatives detailed in the HPI.  Past Medical History: Past Medical History:  Diagnosis Date  . Anemia    a. 08/2016 Guaiac + stool. EGD w/ gastritis.  . Anxiety   . CKD (chronic kidney disease), stage III ([place])   . Closed fracture of shaft of left ulna 11/27/2018  . Colon polyp    a. 08/2016 Colonoscopy.  . Gastritis    a. 08/2016 EGD: Gastritis-->PPI.  [name] GERD (gastroesophageal reflux disease)   . History of chicken pox   . History of measles as a child   . Hypertension    . Hyponatremia    a. 08/2016 in setting of HCTZ Rx.  . Mesenteric lymphadenopathy 04/08/2017  . Multiple thyroid nodules     Past Surgical History: Past Surgical History:  Procedure Laterality Date  . ABDOMINAL HYSTERECTOMY  2003   due to heavy periods and clotting during menses  . BREAST LUMPECTOMY Left 1990  . CHOLECYSTECTOMY  2010  . COLONOSCOPY WITH PROPOFOL N/A 09/07/2016   Procedure: COLONOSCOPY WITH PROPOFOL;  Surgeon: Lucilla Lame, MD;  Location: [facility] ENDOSCOPY;  Service: Endoscopy;  Laterality: N/A;  . CORONARY STENT INTERVENTION N/A 06/27/2020   Procedure: CORONARY STENT INTERVENTION;  Surgeon: Sherren Mocha, MD;  Location: [place] CV LAB;  Service: Cardiovascular;  Laterality: N/A;  . DIALYSIS/PERMA CATHETER INSERTION N/A 11/16/2018   Procedure: DIALYSIS/PERMA CATHETER INSERTION;  Surgeon: Algernon Huxley, MD;  Location: [place] CV LAB;  Service: Cardiovascular;  Laterality: N/A;  . DIALYSIS/PERMA CATHETER REMOVAL N/A 08/16/2019   Procedure: DIALYSIS/PERMA CATHETER REMOVAL;  Surgeon: Algernon Huxley, MD;  Location: [place] CV LAB;  Service: Cardiovascular;  Laterality: N/A;  . DILATION AND CURETTAGE OF UTERUS  1985  . ESOPHAGOGASTRODUODENOSCOPY (EGD) WITH PROPOFOL N/A 09/07/2016   Procedure: ESOPHAGOGASTRODUODENOSCOPY (EGD) WITH PROPOFOL;  Surgeon: Lucilla Lame, MD;  Location: [facility] ENDOSCOPY;  Service: Endoscopy;  Laterality: N/A;  . GALLBLADDER SURGERY  2012  . INTRAMEDULLARY (IM) NAIL INTERTROCHANTERIC Right 04/23/2020   Procedure:  INTRAMEDULLARY (IM) NAIL INTERTROCHANTRIC;  Surgeon: Thornton Park, MD;  Location: [facility] ORS;  Service: Orthopedics;  Laterality: Right;  . INTRAVASCULAR IMAGING/OCT N/A 06/27/2020   Procedure: INTRAVASCULAR IMAGING/OCT;  Surgeon: Sherren Mocha, MD;  Location: [place] CV LAB;  Service: Cardiovascular;  Laterality: N/A;  . KNEE SURGERY     Dr. Tamala Julian  . LEFT HEART CATH AND CORONARY ANGIOGRAPHY N/A 06/25/2020   Procedure: LEFT HEART  CATH AND CORONARY ANGIOGRAPHY;  Surgeon: Troy Sine, MD;  Location: [place] CV LAB;  Service: Cardiovascular;  Laterality: N/A;  . TONSILLECTOMY    . TONSILLECTOMY AND ADENOIDECTOMY  1970  . UPPER GI ENDOSCOPY  03/02/2006   H. Pylori negative; Dr. Allen Norris    Allergies: Allergies as of 07/25/2020 - Review Complete 07/25/2020  Allergen Reaction Noted  . Cephalosporins Hives and Swelling 12/03/2014  . Clarithromycin Other (See Comments) 12/03/2014  . Gabapentin  07/04/2020  . Lunesta  [eszopiclone]  12/03/2014  . Penicillin v  12/03/2014  . Sulfa antibiotics  12/03/2014    Medications:  Current Facility-Administered Medications:  .  0.9 %  sodium chloride infusion, , Intravenous, Continuous, Bradler, Vista Lawman, MD, Last Rate: 150 mL/hr at 07/25/20 0137, New Bag at 07/25/20 0137 .  fentaNYL (SUBLIMAZE) injection 50 mcg, 50 mcg, Intravenous, Q1H PRN, Naaman Plummer, MD .  vancomycin (VANCOCIN) IVPB 1000 mg/200 mL premix, 1,000 mg, Intravenous, Once, Bradler, Vista Lawman, MD  Current Outpatient Medications:  .  aspirin 81 MG chewable tablet, Chew 1 tablet (81 mg total) by mouth daily., Disp: , Rfl:  .  atorvastatin (LIPITOR) 80 MG tablet, Take 1 tablet (80 mg total) by mouth daily at 6 PM., Disp: 30 tablet, Rfl: 0 .  butalbital-acetaminophen-caffeine (FIORICET) 50-325-40 MG tablet, TAKE 1 TO 2 TABLETS BY MOUTH EVERY 4 HOURS AS NEEDED FOR HEADACHE (Patient taking differently: Take 2 tablets by mouth every 6 (six) hours as needed for headache.), Disp: 150 tablet, Rfl: 5 .  calcium acetate (PHOSLO) 667 MG capsule, Take 667 mg by mouth 3 (three) times daily., Disp: , Rfl:  .  clopidogrel (PLAVIX) 75 MG tablet, Take 1 tablet (75 mg total) by mouth daily., Disp: 30 tablet, Rfl: 1 .  esomeprazole (NEXIUM) 20 MG capsule, Take 20 mg by mouth daily., Disp: , Rfl:  .  gentamicin cream (GARAMYCIN) 0.1 %, Apply 1 application topically daily., Disp: 30 g, Rfl: 0 .  levETIRAcetam (KEPPRA) 500 MG tablet,  Take 1 tablet (500 mg total) by mouth 2 (two) times daily., Disp: 60 tablet, Rfl: 1 .  melatonin 3 MG TABS tablet, Take 1 tablet (3 mg total) by mouth at bedtime., Disp: 30 tablet, Rfl: 0 .  metoCLOPramide (REGLAN) 5 MG tablet, Take 1 tablet (5 mg total) by mouth daily at 6 (six) AM., Disp: 30 tablet, Rfl: 0 .  metoprolol succinate (TOPROL-XL) 25 MG 24 hr tablet, Take 1 tablet (25 mg total) by mouth daily., Disp: 30 tablet, Rfl: 0 .  pantoprazole (PROTONIX) 40 MG tablet, Take 1 tablet (40 mg total) by mouth daily., Disp: 30 tablet, Rfl: 0 .  prochlorperazine (COMPAZINE) 10 MG tablet, Take 1 tablet (10 mg total) by mouth every 8 (eight) hours as needed for nausea or vomiting., Disp: 30 tablet, Rfl: 0 .  saccharomyces boulardii (FLORASTOR) 250 MG capsule, Take 1 capsule (250 mg total) by mouth 2 (two) times daily., Disp: 60 capsule, Rfl: 0 .  zolpidem (AMBIEN) 10 MG tablet, Take 10 mg by mouth at bedtime as needed., Disp: ,  Rfl:    Social History: Social History   Tobacco Use  . Smoking status: Never Smoker  . Smokeless tobacco: Never Used  Vaping Use  . Vaping Use: Never used  Substance Use Topics  . Alcohol use: Yes    Alcohol/week: 0.0 - 2.0 standard drinks  . Drug use: No    Family Medical History: Family History  Problem Relation Age of Onset  . Pancreatic cancer Father   . Congestive Heart Failure Maternal Grandmother   . Cancer Paternal Grandfather   . Heart disease Maternal Uncle     Physical Examination: Vitals:   07/25/20 0350 07/25/20 0400  BP: 116/81 112/85  Pulse: (!) 105 (!) 108  Resp: 19 18  Temp: (!) 97.2 F (36.2 C)   SpO2: 100% 100%     General: Patient is intubated.  Psychiatric: Patient is intubated.  Head:  Pupils equal, round, and reactive to light.  ENT:  Oral mucosa appears well hydrated.  Neck:   Supple.  Full range of motion.  Respiratory: Patient is intubated and on ventilator, overbreathing the vent.   Extremities: No  edema.  Vascular: Palpable pulses in dorsal pedal vessels.  Skin:   On exposed skin, there are no abnormal skin lesions.  NEUROLOGICAL:  General: Intubated.   OE voice, regards, FC on L.  She is oriented to self with choices.  She is oriented to year with choices.  Pupils equal round and reactive to light.  Facial tone appears symmetric.  Does not protrude tongue. There is R pronator drift.  She is moving L arm purposefully.  She can move R arm as well at least antigravity.   Appears to be 5/5 LUE and LLE.  4-/5 RUE, 4/5 RLE grossly, but does not fully cooperate with all muscle group testing.   Sensory testing is unreliable.   Reflexes are 1+ and symmetric at the biceps, triceps, brachioradialis, patella and achilles. Hoffman's is absent.  Clonus is not present.      Gait is untested due to critical illness.  Imaging: CT Head 07/25/2020 IMPRESSION: High attenuation extra-axial hemorrhage extending across the left cerebral convexity with slightly heterogeneous internal attenuation which could reflect a swirl sign reflecting mixed hyperacute, acute and chronic hemorrhagic components. Resulting sulcal effacement across the left cerebral hemisphere as well as near complete effacement the left lateral ventricle. Rightward midline shift of approximately 10 mm. Medialization of the left uncus and rightward shift of the midbrain as well.  Critical Value/emergent results were called by telephone at the time of interpretation on 07/25/2020 at 2:41 am to provider [facility] , who verbally acknowledged these results.   Electronically Signed   By: Lovena Le M.D.   On: 07/25/2020 02:42  I have personally reviewed the images and agree with the above interpretation.  Labs: CBC Latest Ref Rng & Units 07/25/2020 07/24/2020 07/23/2020  WBC 4.0 - 10.5 K/uL 22.1(H) 9.7 10.3  Hemoglobin 12.0 - 15.0 g/dL 14.9 12.0 12.7  Hematocrit 36.0 - 46.0 % 44.8 34.8(L) 37.4  Platelets 150 - 400  K/uL 422(H) 282 326       Assessment and Plan: Amy [name] is a pleasant 70 y.o. female with large acute L subdural hematoma causing substantial L to R midline shift due to brain compression.  She presented comatose, and has improved substantially with mannitol.  She is currently GCS E3, V1T, M6 = 10T.   - transfuse platelets - keppra 500 mg now, then BID  She is extremely medically ill,  and may be more appropriately cared for at a tertiary care.  Due to the emergent nature of her subdural, reversal of her anticoagulation is necessary to save her life.  Unfortunately, that may also causing her new stent to clot off, which could also be life threatening. I have reviewed this with her husband and daughter, who understand and asked that we proceed with reversal of her anticoagulation.  Given her improvement after mannitol, surgical evacuation of her subdural is reasonable.  She still has a high risk of mortality or dependence, but lack of intervention will almost certainly be fatal.  I discussed a left-sided craniotomy for evacuation of subdural hematoma at length with the family, including the risks, benefits, alternatives, and indications. The risks discussed include but are not limited to bleeding, infection, need for reoperation, spinal fluid leak, stroke, vision loss, anesthetic complication, coma, paralysis, and even death. I also described in detail that improvement was not guaranteed.  They expressed understanding of these risks, and asked that we proceed with reversal of anticoagulation and surgery. I described the surgery in layman's terms, and gave ample opportunity for questions, which were answered to the best of my ability.  We may not be able to locate platelets in a timely fashion.  If this is the case, we will consider transfer to a tertiary care center.  Hoang Reich K. Izora Ribas MD, [place] Dept. of Neurosurgery

## 2020-07-25 NOTE — Progress Notes (Signed)
CODE SEPSIS - PHARMACY COMMUNICATION  **Broad Spectrum Antibiotics should be administered within 1 hour of Sepsis diagnosis**  Time Code Sepsis Called/Page Received: 0121  Antibiotics Ordered: Aztreonam, Vancomycin, Flagyl  Time of 1st antibiotic administration: 0141  Renda Rolls, PharmD, MBA 07/25/2020 1:25 AM

## 2020-07-25 NOTE — Sepsis Progress Note (Signed)
Following for Sepsis monitoring.   ESRD patient on PD.  Blood cultures already pending.

## 2020-07-25 NOTE — ED Notes (Signed)
EMTALA reviewed by this RN.  Transfer consent signed. ?

## 2020-07-25 NOTE — ED Notes (Signed)
[place] [phone] spoke with Kerry Dory to request ED to ED transfer via [facility]). Transferred to [place] to give medication list for flight team.

## 2020-07-25 NOTE — Progress Notes (Signed)
PHARMACY -  BRIEF ANTIBIOTIC NOTE   Pharmacy has received consult(s) for Aztreonam and Vancomycin from an ED provider.  The patient's profile has been reviewed for ht/wt/allergies/indication/available labs.    One time order(s) placed for Aztreonam 2 gm and Vancomycin 1 gm per pt wt 47.8kg.  Further antibiotics/pharmacy consults should be ordered by admitting physician if indicated.                       Renda Rolls, PharmD, MBA 07/25/2020 1:32 AM

## 2020-07-25 NOTE — ED Notes (Signed)
Called Radiology to power-share CT to [facility]. No fax sheet sent as she can already see the patient

## 2020-07-25 NOTE — ED Provider Notes (Signed)
[facility] Emergency Department Provider Note   ____________________________________________   Event Date/Time   First MD Initiated Contact with Patient 07/25/20 0015     (approximate)  I have reviewed the triage vital signs and the nursing notes.   HISTORY  Chief Complaint unresponsive     HPI Amy [name] is a 70 y.o. female with a past medical history of CKD on peritoneal dialysis, GERD, CAD, and anxiety/depression who presents via EMS after being found unconscious by her husband.  Per EMS, patient was in her normal state of health until approximately 1 hour prior to arrival to the hospital when she became acutely more somnolent and was unable to be aroused by her husband.  EMS state that patient did take an Ambien and melatonin prior to the symptoms occurring.  Further history and review of systems unable to be obtained at this time given patient's mental status         Past Medical History:  Diagnosis Date  . Anemia    a. 08/2016 Guaiac + stool. EGD w/ gastritis.  . Anxiety   . CKD (chronic kidney disease), stage III ([place])   . Closed fracture of shaft of left ulna 11/27/2018  . Colon polyp    a. 08/2016 Colonoscopy.  . Gastritis    a. 08/2016 EGD: Gastritis-->PPI.  [name] GERD (gastroesophageal reflux disease)   . History of chicken pox   . History of measles as a child   . Hypertension   . Hyponatremia    a. 08/2016 in setting of HCTZ Rx.  . Mesenteric lymphadenopathy 04/08/2017  . Multiple thyroid nodules     Patient Active Problem List   Diagnosis Date Noted  . Orthostasis   . Gastroparesis   . Chronic combined systolic and diastolic congestive heart failure ([place])   . Prolonged Q-T interval on ECG   . Nausea   . Hypoalbuminemia due to protein-calorie malnutrition ([place])   . Nausea and vomiting   . Chronic combined systolic and diastolic CHF (congestive heart failure) ([place])   . Anemia of chronic disease   . Seizures ([place])   . Chronic  pain syndrome   . Debility 07/04/2020  . Ischemic cardiomyopathy   . Protein-calorie malnutrition, severe 06/23/2020  . Non-ST elevation (NSTEMI) myocardial infarction ([place])   . Pressure injury of skin 06/21/2020  . C. difficile diarrhea 06/19/2020  . C. difficile colitis 06/19/2020  . Seizure ([place]) 06/19/2020  . Gastroenteritis due to COVID-19 virus 05/29/2020  . Surgery, elective   . S/P ORIF (open reduction internal fixation) fracture   . Closed right hip fracture, initial encounter ([place]) 04/22/2020  . Hypotension 12/16/2019  . ESRD on dialysis ([place]) 12/16/2019  . Depression with anxiety 12/16/2019  . Hypomagnesemia 12/16/2019  . Atrial flutter ([place])   . Atrial fibrillation with RVR ([place]) 04/03/2019  . Benign essential HTN 04/03/2019  . HLD (hyperlipidemia) 04/03/2019  . End stage renal disease ([place]) 12/13/2018  . Itching 11/08/2018  . Hyperparathyroidism, secondary renal ([place]) 09/13/2017  . Ground glass opacity present on imaging of lung 04/08/2017  . Osteoarthritis of knee 12/29/2016  . Normocytic anemia 10/14/2016  . Intractable episodic headache   . Gastritis   . History of adenomatous polyp of colon 09/07/2016  . Iron deficiency anemia 09/05/2016  . Hyponatremia 09/05/2016  . Multiple thyroid nodules 07/04/2015  . Right thyroid nodule 06/27/2015  . Allergic rhinitis, seasonal 12/03/2014  . Fever blister 12/03/2014  . Herniated lumbar intervertebral disc 12/03/2014  .  Hypokalemia 12/03/2014  . Recurrent sinus infections 12/03/2014  . Insomnia 11/05/2014  . Acquired scoliosis 06/26/2014  . Intervertebral disc stenosis of neural canal of lumbar region 06/20/2014  . Lumbar facet arthropathy 05/21/2014  . Degenerative spondylolisthesis 03/12/2014  . OP (osteoporosis) 08/10/2013  . Idiopathic peripheral neuropathy 03/24/2009  . Edema 12/27/2007  . Headache, tension-type 10/25/2006  . Essential hypertension 10/24/2006  . Anxiety 05/03/1998  . Esophageal reflux  05/03/1998    Past Surgical History:  Procedure Laterality Date  . ABDOMINAL HYSTERECTOMY  2003   due to heavy periods and clotting during menses  . BREAST LUMPECTOMY Left 1990  . CHOLECYSTECTOMY  2010  . COLONOSCOPY WITH PROPOFOL N/A 09/07/2016   Procedure: COLONOSCOPY WITH PROPOFOL;  Surgeon: Lucilla Lame, MD;  Location[facility] ENDOSCOPY;  Service: Endoscopy;  Laterality: N/A;  . CORONARY STENT INTERVENTION N/A 06/27/2020   Procedure: CORONARY STENT INTERVENTION;  Surgeon: Sherren Mocha, MD;  Location: [place] CV LAB;  Service: Cardiovascular;  Laterality: N/A;  . DIALYSIS/PERMA CATHETER INSERTION N/A 11/16/2018   Procedure: DIALYSIS/PERMA CATHETER INSERTION;  Surgeon: Algernon Huxley, MD;  Location: [place] CV LAB;  Service: Cardiovascular;  Laterality: N/A;  . DIALYSIS/PERMA CATHETER REMOVAL N/A 08/16/2019   Procedure: DIALYSIS/PERMA CATHETER REMOVAL;  Surgeon: Algernon Huxley, MD;  Location: [place] CV LAB;  Service: Cardiovascular;  Laterality: N/A;  . DILATION AND CURETTAGE OF UTERUS  1985  . ESOPHAGOGASTRODUODENOSCOPY (EGD) WITH PROPOFOL N/A 09/07/2016   Procedure: ESOPHAGOGASTRODUODENOSCOPY (EGD) WITH PROPOFOL;  Surgeon: Lucilla Lame, MD;  Location: [facility] ENDOSCOPY;  Service: Endoscopy;  Laterality: N/A;  . GALLBLADDER SURGERY  2012  . INTRAMEDULLARY (IM) NAIL INTERTROCHANTERIC Right 04/23/2020   Procedure: INTRAMEDULLARY (IM) NAIL INTERTROCHANTRIC;  Surgeon: Thornton Park, MD;  Location: [facility] ORS;  Service: Orthopedics;  Laterality: Right;  . INTRAVASCULAR IMAGING/OCT N/A 06/27/2020   Procedure: INTRAVASCULAR IMAGING/OCT;  Surgeon: Sherren Mocha, MD;  Location: [place] CV LAB;  Service: Cardiovascular;  Laterality: N/A;  . KNEE SURGERY     Dr. Tamala Julian  . LEFT HEART CATH AND CORONARY ANGIOGRAPHY N/A 06/25/2020   Procedure: LEFT HEART CATH AND CORONARY ANGIOGRAPHY;  Surgeon: Troy Sine, MD;  Location: [place] CV LAB;  Service: Cardiovascular;  Laterality: N/A;  .  TONSILLECTOMY    . TONSILLECTOMY AND ADENOIDECTOMY  1970  . UPPER GI ENDOSCOPY  03/02/2006   H. Pylori negative; Dr. Allen Norris    Prior to Admission medications   Medication Sig Start Date End Date Taking? Authorizing Provider  aspirin 81 MG chewable tablet Chew 1 tablet (81 mg total) by mouth daily. 06/30/20  Yes Geradine Girt, DO  atorvastatin (LIPITOR) 80 MG tablet Take 1 tablet (80 mg total) by mouth daily at 6 PM. 06/30/20  Yes Vann, Jessica U, DO  butalbital-acetaminophen-caffeine (FIORICET) 50-325-40 MG tablet TAKE 1 TO 2 TABLETS BY MOUTH EVERY 4 HOURS AS NEEDED FOR HEADACHE Patient taking differently: Take 2 tablets by mouth every 6 (six) hours as needed for headache. 05/25/20  Yes [name], MD  calcium acetate (PHOSLO) 667 MG capsule Take 667 mg by mouth 3 (three) times daily. 07/18/20  Yes [provider]  clopidogrel (PLAVIX) 75 MG tablet Take 1 tablet (75 mg total) by mouth daily. 06/30/20  Yes Eulogio Bear U, DO  esomeprazole (NEXIUM) 20 MG capsule Take 20 mg by mouth daily. 07/18/20  Yes [provider]  gentamicin cream (GARAMYCIN) 0.1 % Apply 1 application topically daily. 07/24/20  Yes Love, Ivan Anchors, PA-C  levETIRAcetam (KEPPRA)  500 MG tablet Take 1 tablet (500 mg total) by mouth 2 (two) times daily. 07/24/20  Yes Love, Ivan Anchors, PA-C  melatonin 3 MG TABS tablet Take 1 tablet (3 mg total) by mouth at bedtime. 07/24/20  Yes Love, Ivan Anchors, PA-C  metoCLOPramide (REGLAN) 5 MG tablet Take 1 tablet (5 mg total) by mouth daily at 6 (six) AM. 07/25/20  Yes Love, Ivan Anchors, PA-C  metoprolol succinate (TOPROL-XL) 25 MG 24 hr tablet Take 1 tablet (25 mg total) by mouth daily. 07/24/20  Yes Love, Ivan Anchors, PA-C  pantoprazole (PROTONIX) 40 MG tablet Take 1 tablet (40 mg total) by mouth daily. 07/24/20  Yes Love, Ivan Anchors, PA-C  prochlorperazine (COMPAZINE) 10 MG tablet Take 1 tablet (10 mg total) by mouth every 8 (eight) hours as needed for nausea or vomiting. 07/24/20  Yes Love,  Ivan Anchors, PA-C  saccharomyces boulardii (FLORASTOR) 250 MG capsule Take 1 capsule (250 mg total) by mouth 2 (two) times daily. 07/24/20  Yes Love, Ivan Anchors, PA-C  zolpidem (AMBIEN) 10 MG tablet Take 10 mg by mouth at bedtime as needed. 07/24/20  Yes [provider]    Allergies Cephalosporins, Clarithromycin, Gabapentin, Lunesta  [eszopiclone], Penicillin v, and Sulfa antibiotics  Family History  Problem Relation Age of Onset  . Pancreatic cancer Father   . Congestive Heart Failure Maternal Grandmother   . Cancer Paternal Grandfather   . Heart disease Maternal Uncle     Social History Social History   Tobacco Use  . Smoking status: Never Smoker  . Smokeless tobacco: Never Used  Vaping Use  . Vaping Use: Never used  Substance Use Topics  . Alcohol use: Yes    Alcohol/week: 0.0 - 2.0 standard drinks  . Drug use: No    Review of Systems Unable to assess ____________________________________________   PHYSICAL EXAM:  VITAL SIGNS: ED Triage Vitals  Enc Vitals Group     BP 07/25/20 0017 (!) 87/66     Pulse Rate 07/25/20 0017 (!) 102     Resp 07/25/20 0017 (!) 22     Temp 07/25/20 0105 (S) (!) 95.4 F (35.2 C)     Temp Source 07/25/20 0105 Bladder     SpO2 07/25/20 0017 100 %     Weight 07/25/20 0018 105 lb 6.1 oz (47.8 kg)     Height 07/25/20 0018 5\' 4"  (1.626 m)     Head Circumference --      Peak Flow --      Pain Score 07/25/20 0017 Asleep     Pain Loc --      Pain Edu? --      Excl. in [place]? --    Constitutional: Unresponsive on stretcher.  Elderly cachectic Caucasian female in no acute distress. Eyes: Conjunctivae are injected.  Right pupil equally round and reactive to light, left pupil 4 mm and unreactive. Head: Atraumatic. Nose: No congestion/rhinnorhea. Mouth/Throat: Mucous membranes are moist. Neck: No stridor Cardiovascular: Grossly normal heart sounds.  Good peripheral circulation. Respiratory: Normal respiratory effort.  Coarse breath  sounds Gastrointestinal: Soft and nontender. No distention. Genitourinary: Normal external female genitalia Musculoskeletal: No obvious deformities Neurologic: GCS 6 Skin:  Skin is warm and dry. No rash noted.  ____________________________________________   LABS (all labs ordered are listed, but only abnormal results are displayed)  Labs Reviewed  BRAIN NATRIURETIC PEPTIDE - Abnormal; Notable for the following components:      Result Value   B Natriuretic Peptide 116.4 (*)  All other components within normal limits  LACTIC ACID, PLASMA - Abnormal; Notable for the following components:   Lactic Acid, Venous 4.2 (*)    All other components within normal limits  LACTIC ACID, PLASMA - Abnormal; Notable for the following components:   Lactic Acid, Venous 3.8 (*)    All other components within normal limits  CBC WITH DIFFERENTIAL/PLATELET - Abnormal; Notable for the following components:   WBC 22.1 (*)    RDW 20.3 (*)    Platelets 422 (*)    Neutro Abs 19.4 (*)    Abs Immature Granulocytes 0.17 (*)    All other components within normal limits  BLOOD GAS, VENOUS - Abnormal; Notable for the following components:   pCO2, Ven 34 (*)    Bicarbonate 17.1 (*)    Acid-base deficit 8.2 (*)    All other components within normal limits  COMPREHENSIVE METABOLIC PANEL - Abnormal; Notable for the following components:   Sodium 129 (*)    Potassium 3.4 (*)    Chloride 96 (*)    CO2 17 (*)    Glucose, Bld 173 (*)    BUN 27 (*)    Creatinine, Ser 4.24 (*)    Calcium 7.9 (*)    Total Protein 4.5 (*)    Albumin 2.0 (*)    AST 42 (*)    Alkaline Phosphatase 159 (*)    GFR, Estimated 11 (*)    Anion gap 16 (*)    All other components within normal limits  TROPONIN I (HIGH SENSITIVITY) - Abnormal; Notable for the following components:   Troponin I (High Sensitivity) 66 (*)    All other components within normal limits  TROPONIN I (HIGH SENSITIVITY) - Abnormal; Notable for the following  components:   Troponin I (High Sensitivity) 57 (*)    All other components within normal limits  CULTURE, BLOOD (ROUTINE X 2)  CULTURE, BLOOD (ROUTINE X 2)  RESP PANEL BY RT-PCR (FLU A&B, COVID) ARPGX2  ETHANOL  TYPE AND SCREEN  TYPE AND SCREEN  PREPARE PLATELET PHERESIS   ____________________________________________  EKG  ED ECG REPORT I, Naaman Plummer, the attending physician, personally viewed and interpreted this ECG.  Date: 07/25/2020 EKG Time: 0016 Rate: 102 Rhythm: Tachycardic sinus rhythm QRS Axis: normal Intervals: normal ST/T Wave abnormalities: normal Narrative Interpretation: no evidence of acute ischemia  ____________________________________________  RADIOLOGY  ED MD interpretation: CT of the head without contrast shows a large left subdural hematoma resulting in approximately 10 mm of rightward shift and medialization of the left uncus  One-view portable chest x-ray shows ET tube in adequate positioning and OG tube slightly proximal to adequate positioning  Official radiology report(s): CT Head Wo Contrast  Result Date: 07/25/2020 CLINICAL DATA:  Discharge after 3 month a mission for C diff and complications. EXAM: CT HEAD WITHOUT CONTRAST TECHNIQUE: Contiguous axial images were obtained from the base of the skull through the vertex without intravenous contrast. COMPARISON:  CT angiography 06/19/2020 FINDINGS: Brain: High attenuation extra-axial hemorrhage is seen extending across the left cerebral convexity with slightly heterogeneous internal attenuation which could reflect a swirl sign reflecting mixed hyperacute, acute and chronic hemorrhagic components. Small amount of hemorrhage likely tracking along the falx and tentorium. Resulting sulcal effacement across the left cerebral hemisphere as well as near complete effacement the left lateral ventricle. Rightward midline shift of approximately 10 mm. Medialization of the left uncus and rightward shift of the  midbrain as well. No contralateral sites of hemorrhage. No  CT evident areas of large vascular territory infarct. Background of microvascular angiopathy and parenchymal volume loss. Vascular: Atherosclerotic calcification of the carotid siphons and intradural vertebral arteries. No hyperdense vessel. Skull: No calvarial fracture or suspicious osseous lesion. No scalp swelling or hematoma. Sinuses/Orbits: Layering fluid in the right maxillary sinus. Minimal mural thickening in the ethmoids. Middle ear cavities and mastoid air cells are clear. Debris in the right external auditory canal. Included orbital structures are unremarkable. Other: None IMPRESSION: High attenuation extra-axial hemorrhage extending across the left cerebral convexity with slightly heterogeneous internal attenuation which could reflect a swirl sign reflecting mixed hyperacute, acute and chronic hemorrhagic components. Resulting sulcal effacement across the left cerebral hemisphere as well as near complete effacement the left lateral ventricle. Rightward midline shift of approximately 10 mm. Medialization of the left uncus and rightward shift of the midbrain as well. Critical Value/emergent results were called by telephone at the time of interpretation on 07/25/2020 at 2:41 am to provider [facility] , who verbally acknowledged these results. Electronically Signed   By: Lovena Le M.D.   On: 07/25/2020 02:42   DG Chest Port 1 View  Result Date: 07/25/2020 CLINICAL DATA:  Cough EXAM: PORTABLE CHEST 1 VIEW COMPARISON:  Radiograph 07/23/2020 FINDINGS: Endotracheal tube tip terminates 4.2 cm from the carina. Transesophageal tube tip terminates in the region of the gastric body with the side port near the GE junction. Consider repositioning. Surgical material projects over the upper abdomen. Telemetry leads and external support devices overlie the chest. No consolidative process or convincing edema. No pneumothorax or layering occlusion. Stable  cardiomediastinal contours. The osseous structures appear diffusely demineralized which may limit detection of small or nondisplaced fractures. No acute osseous abnormality or suspicious osseous lesion. Remaining soft tissues are grossly unremarkable. IMPRESSION: 1. No acute cardiopulmonary abnormality. 2. Transesophageal tube tip terminates in the region of the gastric body with the side port near the GE junction. Consider advancing 3 cm for optimal function. 3. Appropriate positioning of the endotracheal tube. Electronically Signed   By: Lovena Le M.D.   On: 07/25/2020 01:20    ____________________________________________   PROCEDURES  Procedure(s) performed (including Critical Care):  .Critical Care Performed by: Naaman Plummer, MD Authorized by: Naaman Plummer, MD   Critical care provider statement:    Critical care time (minutes):  66   Critical care time was exclusive of:  Separately billable procedures and treating other patients   Critical care was necessary to treat or prevent imminent or life-threatening deterioration of the following conditions:  CNS failure or compromise   Critical care was time spent personally by me on the following activities:  Discussions with consultants, evaluation of patient's response to treatment, examination of patient, ordering and performing treatments and interventions, ordering and review of laboratory studies, ordering and review of radiographic studies, pulse oximetry, re-evaluation of patient's condition, obtaining history from patient or surrogate, review of old charts and ventilator management   I assumed direction of critical care for this patient from another provider in my specialty: no     Care discussed with: admitting provider   Procedure Name: Intubation Date/Time: 07/25/2020 4:54 AM Performed by: Naaman Plummer, MD Pre-anesthesia Checklist: Patient identified, Patient being monitored, Emergency Drugs available, Timeout performed and  Suction available Oxygen Delivery Method: Non-rebreather mask Preoxygenation: Pre-oxygenation with 100% oxygen Induction Type: Rapid sequence Ventilation: Mask ventilation without difficulty Laryngoscope Size: Glidescope Grade View: Grade I Tube size: 7.5 mm Number of attempts: 1 Airway Equipment and Method:  Video-laryngoscopy Placement Confirmation: ETT inserted through vocal cords under direct vision,  CO2 detector and Breath sounds checked- equal and bilateral Secured at: 23 cm Tube secured with: ETT holder Dental Injury: Teeth and Oropharynx as per pre-operative assessment     .1-3 Lead EKG Interpretation Performed by: Naaman Plummer, MD Authorized by: Naaman Plummer, MD     Interpretation: abnormal     ECG rate:  113   ECG rate assessment: tachycardic     Rhythm: sinus tachycardia     Ectopy: none     Conduction: normal       ____________________________________________   INITIAL IMPRESSION / ASSESSMENT AND PLAN / ED COURSE  As part of my medical decision making, I reviewed the following data within the [place] notes reviewed and incorporated, Labs reviewed, EKG interpreted, Old chart reviewed, Radiograph reviewed and Notes from prior ED visits reviewed and incorporated        + neurological deficits from baseline including altered mental status with GCS of 4 These findings progressed over 2 hours  Unlikely infectious etiology or changes secondary to ingestion  Workup: POCT glucose. CBC, BMP, PT/INR, PTT, troponin, type and screen ECG and non-contrast head CT  Findings: CT Brain: intracranial hemorrhage in the left cerebral convexity consistent with a subdural hematoma with rightward shift ECG: No cerebral T waves. No STEMI  Interventions: BP Control: Nicardipine 5mg /hr by slow infusion (63ml/hr) titrating to maximum of 30mg /hr with permissive hypertension (SBP goal 180) Intubation for airway protection Mannitol Empiric  antibiotics Keppra load Consults: Neurosurgery  Dispo: Transfer for neurosurgical intervention      ____________________________________________   FINAL CLINICAL IMPRESSION(S) / ED DIAGNOSES  Final diagnoses:  Subdural hematoma (HCC)  Glasgow coma scale total score 3-8, at arrival to emergency department [facility])     ED Discharge Orders    None       Note:  This document was prepared using Dragon voice recognition software and may include unintentional dictation errors.   Naaman Plummer, MD 07/25/20 [phone]

## 2020-07-26 LAB — PREPARE PLATELET PHERESIS
Unit division: 0
Unit division: 0

## 2020-07-26 LAB — BPAM PLATELET PHERESIS
Blood Product Expiration Date: 202203252359
Blood Product Expiration Date: 202203272359
ISSUE DATE / TIME: 202203250433
Unit Type and Rh: 7300
Unit Type and Rh: 9500

## 2020-07-27 LAB — BODY FLUID CULTURE W GRAM STAIN
Culture: NO GROWTH
Gram Stain: NONE SEEN

## 2020-07-28 LAB — CULTURE, BLOOD (ROUTINE X 2)
Culture: NO GROWTH
Culture: NO GROWTH
Special Requests: ADEQUATE

## 2020-07-30 ENCOUNTER — Encounter: Payer: Self-pay | Admitting: *Deleted

## 2020-07-30 LAB — CULTURE, BLOOD (ROUTINE X 2)
Culture: NO GROWTH
Culture: NO GROWTH
Special Requests: ADEQUATE

## 2020-07-30 NOTE — PMR Pre-admission (Signed)
PMR Admission Coordinator Pre-Admission Assessment  Patient: JOHNANNA [name] is an 70 y.o., female MRN: [id number] DOB: 12/30/1950  Height: 5\' 4"  (1.626 m) Weight: 47.8 kg  Insurance Information HMO:     PPO:      PCP:      IPA:      80/20:      OTHER:  PRIMARY: Medicare a and b      Policy#: [id number]      Subscriber: pt Benefits:  Phone #: passport one online     Name: 3/20 Eff. Date: a 10/31/2013 and b 04/02/2016     Deduct: $1556      Out of Pocket Max: none     L CIR: 100%      SNF: 20 full days Outpatient: 80%     Co-Pay: 20% Home Health: 100%      Co-Pay: none DME: 80%     Co-Pay: 20% Providers: pt choice  SECONDARY: [employer] supplement      Policy#: [id number]  Financial Counselor:       Phone#:   The "Data Collection Information Summary" for patients in Inpatient Rehabilitation Facilities with attached "Privacy Act [place] Records" was provided and verbally reviewed with: Family  Emergency Contact Information Contact Information    Name Relation Home Work Mobile   Barb,[name] Spouse   Titanic Daughter [phone]  [phone]      Current Medical History  Patient Admitting Diagnosis: SDH  History of Present Illness: 70 year old female with past medial history of ESRD on peritoneal dialysis, GERD, CAD, seizures, CAD with reduced EF of 25 to 30%, anemia, anxiety and depression. Patient with discharge from [facility] CIR 07/24/2020 for 20 days for debility from cdiff colitis and underwent coronary stent on 06/27/2020.   Presented 07/25/2020 via EMS to [facility] after being found progressively less conscious by husband.  Reported a small fall/sat down per husband but did not hit head or have LOC. She asked to take her Ambien and melatonin around 6 or 7 pm due to fatigue and went to sleep very quickly per family. Husband unable to arouse her a couple of hours later, so EMS was called. Found to have a large left subdural hematoma causing substantial Left to  right midline shift. Reversal of anticoagulation, intubation and  sent to [facility] for neurosurgical intervention.  On 3/25 went to OR for emergent hemi-crani of SDH. Started on antibiotics for hypotension, elevated lactate and leukocytosis. Found to have new onset language deficits and left facial weakness.  CT brain stable and SD drain removed  3/27 postoperatively. Some worsening aphasia on 3/27 with repeat CT scan and restarted cEEG monitoring. Switched Keppra to Brivaracetam since on PD.  On 3/28 clinical seizure manifested by right face myoclonic jerking and speech  arrest but patient alert and interactive. Given Ativan and loaded with Vimpat. No electrographic correlation with EEG . On dexamethasone per NSU. Some lethargy noted felt due to anti seizure meds.  Nephrology following for nightly PD, Chronic hyponatremia. Na at baseline in high 120's. NSU recommends for NA >130. Resumed 3% for hyponatremia. Has had issues with hypotension, held metoprolol. Started home bicarb tablets. Started calorie count. BP low at HS so discontinued Lisinopril.  Eating so poorly  So Cortrak was placed 4/1 and then removed.  Currently holding DAPT and to resume antiplatelet asap per Cardiology. Per NSU ASA can be restarted on day 7 post NSU 4/1.     Patient's medical record  from [facility] has been reviewed by the rehabilitation admission coordinator and physician.  Past Medical History  Past Medical History:  Diagnosis Date  . Anemia    a. 08/2016 Guaiac + stool. EGD w/ gastritis.  . Anxiety   . CKD (chronic kidney disease), stage III ([place])   . Closed fracture of shaft of left ulna 11/27/2018  . Colon polyp    a. 08/2016 Colonoscopy.  . Gastritis    a. 08/2016 EGD: Gastritis-->PPI.  [name] GERD (gastroesophageal reflux disease)   . History of chicken pox   . History of measles as a child   . Hypertension   . Hyponatremia    a. 08/2016 in setting of HCTZ Rx.  . Mesenteric  lymphadenopathy 04/08/2017  . Multiple thyroid nodules     Family History   family history includes Cancer in her paternal grandfather; Congestive Heart Failure in her maternal grandmother; Heart disease in her maternal uncle; Pancreatic cancer in her father.  Prior Rehab/Hospitalizations Has the patient had prior rehab or hospitalizations prior to admission? Yes  CIR 07/04/2020 until 07/24/2020 with d/c home at supervision level for safety  Has the patient had major surgery during 100 days prior to admission? Yes   Current Medications  Current Outpatient Medications:  .  aspirin 81 MG chewable tablet, Chew 1 tablet (81 mg total) by mouth daily., Disp: , Rfl:  .  atorvastatin (LIPITOR) 80 MG tablet, Take 1 tablet (80 mg total) by mouth daily at 6 PM., Disp: 30 tablet, Rfl: 0 .  atorvastatin (LIPITOR) 80 MG tablet, TAKE 1 TABLET (80 MG TOTAL) BY MOUTH DAILY AT 6 PM., Disp: 30 tablet, Rfl: 0 .  butalbital-acetaminophen-caffeine (FIORICET) 50-325-40 MG tablet, TAKE 1 TO 2 TABLETS BY MOUTH EVERY 4 HOURS AS NEEDED FOR HEADACHE (Patient taking differently: Take 2 tablets by mouth every 6 (six) hours as needed for headache.), Disp: 150 tablet, Rfl: 5 .  calcium acetate (PHOSLO) 667 MG capsule, Take 667 mg by mouth 3 (three) times daily., Disp: , Rfl:  .  clopidogrel (PLAVIX) 75 MG tablet, Take 1 tablet (75 mg total) by mouth daily., Disp: 30 tablet, Rfl: 1 .  clopidogrel (PLAVIX) 75 MG tablet, TAKE 1 TABLET (75 MG TOTAL) BY MOUTH DAILY., Disp: 30 tablet, Rfl: 1 .  esomeprazole (NEXIUM) 20 MG capsule, Take 20 mg by mouth daily., Disp: , Rfl:  .  gentamicin cream (GARAMYCIN) 0.1 %, Apply 1 application topically daily., Disp: 30 g, Rfl: 0 .  levETIRAcetam (KEPPRA) 500 MG tablet, Take 1 tablet (500 mg total) by mouth 2 (two) times daily., Disp: 60 tablet, Rfl: 1 .  levETIRAcetam (KEPPRA) 500 MG tablet, TAKE 1 TABLET (500 MG TOTAL) BY MOUTH TWO TIMES DAILY., Disp: 60 tablet, Rfl: 1 .  melatonin 3 MG TABS  tablet, Take 1 tablet (3 mg total) by mouth at bedtime., Disp: 30 tablet, Rfl: 0 .  metoCLOPramide (REGLAN) 5 MG tablet, Take 1 tablet (5 mg total) by mouth daily at 6 (six) AM., Disp: 30 tablet, Rfl: 0 .  metoprolol succinate (TOPROL-XL) 25 MG 24 hr tablet, Take 1 tablet (25 mg total) by mouth daily., Disp: 30 tablet, Rfl: 0 .  metoprolol succinate (TOPROL-XL) 25 MG 24 hr tablet, TAKE 1 TABLET (25 MG TOTAL) BY MOUTH DAILY., Disp: 30 tablet, Rfl: 0 .  pantoprazole (PROTONIX) 40 MG tablet, Take 1 tablet (40 mg total) by mouth daily., Disp: 30 tablet, Rfl: 0 .  pantoprazole (PROTONIX) 40 MG tablet, TAKE 1 TABLET (  40 MG TOTAL) BY MOUTH DAILY., Disp: 30 tablet, Rfl: 1 .  prochlorperazine (COMPAZINE) 10 MG tablet, Take 1 tablet (10 mg total) by mouth every 8 (eight) hours as needed for nausea or vomiting., Disp: 30 tablet, Rfl: 0 .  saccharomyces boulardii (FLORASTOR) 250 MG capsule, Take 1 capsule (250 mg total) by mouth 2 (two) times daily., Disp: 60 capsule, Rfl: 0 .  zolpidem (AMBIEN) 10 MG tablet, Take 10 mg by mouth at bedtime as needed., Disp: , Rfl:   Patients Current Diet: Diet Renal. Poor PO intake  Precautions / Restrictions Precautions: Fall Precaution Comments: seizure precautions Weight Bearing Restrictions: No   Has the patient had 2 or more falls or a fall with injury in the past year? Yes  Prior Activity Level Limited Community (1-2x/wk): CGA, supervision at d/c on 3/24 form CIR   Prior Functional Level Self Care: Did the patient need help bathing, dressing, using the toilet or eating? Needed some help  Indoor Mobility: Did the patient need assistance with walking from room to room (with or without device)? Needed some help  Stairs: Did the patient need assistance with internal or external stairs (with or without device)? Needed some help  Functional Cognition: Did the patient need help planning regular tasks such as shopping or remembering to take medications?  Independent  Home Assistive Devices / Equipment    Prior Device Use: Indicate devices/aids used by the patient prior to current illness, exacerbation or injury? Walker   Prior Functional Level Current Functional Level  Bed Mobility   Independent  mod assist   Transfers   supervision   mod assist   Mobility - Walk/Wheelchair   ambulated with RW at supervision level except CGA negotiating threshold of bathroom. 150 feet with RW. Some LOB needing min assist to recover when felling light headed. LEs wrapped with ace wraps with patient in supine due to ortho stasis issues    Mod assist transfers to [facility] and recliner. No further gait  Upper Body Dressing   clothing management and hygiene at supervision   mod   Lower Body Dressing   supervision   max to total   Grooming   supervision   mod   Eating/Drinking   independent but poor po with chronic nausea, but patient reports nausea is new during CIR admit   poor po intake. Cortrak placed short term and then removed   Toilet Transfer   supervision   mod    Bladder Continence    continent      Bowel Management   continent      Stair Climbing    supervision   not attempted   Communication   independent   new speech deficits   Memory   capable of making own decisions       Special Needs/ Care Considerations Dialysis: Peritoneal [place] with [facility] [name] at home nightly  Previous Home Environment  Living Arrangements: Spouse/significant other  Lives With: Spouse Available Help at Discharge: Family; Available 24 hours/day (spouse, daughter and hired assist) Type of Home: [place]: Two level; Able to live on main level with bedroom/bathroom Home Access: Stairs to enter Entrance Stairs-Rails: None Entrance Stairs-Number of Steps: 2 Bathroom Shower/Tub: Multimedia programmer: Standard Bathroom Accessibility: Yes How Accessible: Accessible via walker [place]: Yes  Discharge  Living Setting Plans for Discharge Living Setting: Patient's home; Lives with (comment) (spouse) Type of Home at Discharge: House Discharge Home Layout: Two level; Able to live  on main level with bedroom/bathroom Discharge Home Access: Stairs to enter Entrance Stairs-Rails: None Entrance Stairs-Number of Steps: 2 Discharge Bathroom Shower/Tub: Walk-in shower Discharge Bathroom Toilet: Standard Discharge Bathroom Accessibility: Yes How Accessible: Accessible via walker Does the patient have any problems obtaining your medications?: No  Social/Family/Support Systems Patient Roles: Spouse; Parent Contact Information: spouse, Legrand Como Anticipated Caregiver: Husband and daughter Anticipated Ambulance person Information: see above Ability/Limitations of Caregiver: Husband works in Hotel manager 7:30-5 pm and daughter has her own Product manager Caregiver Availability: 24/7 Discharge Plan Discussed with Primary Caregiver: Yes Is Caregiver In Agreement with Plan?: Yes Does Caregiver/Family have Issues with Lodging/Transportation while Pt is in Rehab?: No  Goals Patient/Family Goal for Rehab: supervision to min assist with PT, OT and SLP Expected length of stay: ELOS 2 weeks Pt/Family Agrees to Admission and willing to participate: Yes Program Orientation Provided & Reviewed with Pt/Caregiver Including Roles  & Responsibilities: Yes  Decrease burden of Care through IP rehab admission: n/a  Possible need for SNF placement upon discharge: not anticipated. Would have to switch to hemodialysis to be placed in SNF  Patient Condition: I have reviewed medical records from [facility], spoken with CM, and spouse. I discussed via phone for inpatient rehabilitation assessment.  Patient will benefit from ongoing PT, OT and SLP, can actively participate in 3 hours of therapy a day 5 days of the week, and can make measurable gains during the admission.  Patient will also benefit from the  coordinated team approach during an Inpatient Acute Rehabilitation admission.  The patient will receive intensive therapy as well as Rehabilitation physician, nursing, social worker, and care management interventions.  Due to bladder management, bowel management, safety, skin/wound care, disease management, medication administration, pain management and patient education the patient requires 24 hour a day rehabilitation nursing.  The patient is currently mod assist overall with mobility and basic ADLs.  Discharge setting and therapy post discharge at home with home health is anticipated.  Patient has agreed to participate in the Acute Inpatient Rehabilitation Program and will admit today.  Preadmission Screen Completed By:  Cleatrice Burke, 08/04/2020 9:43 AM ______________________________________________________________________   Discussed status with Dr. Naaman Plummer on  08/04/2020 at 40 and received approval for admission today.  Admission Coordinator:  Cleatrice Burke, RN, time 8938 Date 08/04/2020   Assessment/Plan: 1. Diagnosis: Left SDH 2. Does the need for close, 24 hr/day Medical supervision in concert with the patient's rehab needs make it unreasonable for this patient to be served in a less intensive setting? Yes 3. Co-Morbidities requiring supervision/potential complications: ESRD/PD, CAD, recent c diff colitis, seizures 4. Due to bladder management, bowel management, safety, skin/wound care, disease management, medication administration, pain management and patient education, does the patient require 24 hr/day rehab nursing? Yes 5. Does the patient require coordinated care of a physician, rehab nurse, PT, OT, and SLP to address physical and functional deficits in the context of the above medical diagnosis(es)? Yes Addressing deficits in the following areas: balance, endurance, locomotion, strength, transferring, bowel/bladder control, bathing, dressing, feeding, grooming, toileting,  cognition, speech, language, swallowing and psychosocial support 6. Can the patient actively participate in an intensive therapy program of at least 3 hrs of therapy 5 days a week? Yes 7. The potential for patient to make measurable gains while on inpatient rehab is excellent 8. Anticipated functional outcomes upon discharge from inpatient rehab: supervision and min assist PT, supervision and min assist OT, supervision and min assist SLP 9. Estimated rehab length of  stay to reach the above functional goals is: 14 days 10. Anticipated discharge destination: Home 10. Overall Rehab/Functional Prognosis: excellent  MD Signature Meredith Staggers, MD, [facility] 08/04/2020

## 2020-08-01 ENCOUNTER — Encounter: Payer: Medicare Other | Admitting: Registered Nurse

## 2020-08-04 ENCOUNTER — Encounter (HOSPITAL_COMMUNITY): Payer: Self-pay | Admitting: Physical Medicine & Rehabilitation

## 2020-08-04 ENCOUNTER — Other Ambulatory Visit: Payer: Self-pay

## 2020-08-04 ENCOUNTER — Inpatient Hospital Stay (HOSPITAL_COMMUNITY)
Admission: RE | Admit: 2020-08-04 | Discharge: 2020-09-15 | DRG: 945 | Disposition: A | Discharge status: Skilled Nursing Facility | Payer: Medicare Other | Source: Other Acute Inpatient Hospital | Attending: Physical Medicine & Rehabilitation | Admitting: Physical Medicine & Rehabilitation

## 2020-08-04 DIAGNOSIS — R4189 Other symptoms and signs involving cognitive functions and awareness: Secondary | ICD-10-CM | POA: Diagnosis present

## 2020-08-04 DIAGNOSIS — I252 Old myocardial infarction: Secondary | ICD-10-CM | POA: Diagnosis not present

## 2020-08-04 DIAGNOSIS — G40009 Localization-related (focal) (partial) idiopathic epilepsy and epileptic syndromes with seizures of localized onset, not intractable, without status epilepticus: Secondary | ICD-10-CM | POA: Diagnosis not present

## 2020-08-04 DIAGNOSIS — G43909 Migraine, unspecified, not intractable, without status migrainosus: Secondary | ICD-10-CM | POA: Diagnosis present

## 2020-08-04 DIAGNOSIS — Z800 Family history of malignant neoplasm of digestive organs: Secondary | ICD-10-CM

## 2020-08-04 DIAGNOSIS — D72829 Elevated white blood cell count, unspecified: Secondary | ICD-10-CM | POA: Diagnosis not present

## 2020-08-04 DIAGNOSIS — G40909 Epilepsy, unspecified, not intractable, without status epilepticus: Secondary | ICD-10-CM | POA: Diagnosis not present

## 2020-08-04 DIAGNOSIS — W19XXXD Unspecified fall, subsequent encounter: Secondary | ICD-10-CM | POA: Diagnosis not present

## 2020-08-04 DIAGNOSIS — S065X9A Traumatic subdural hemorrhage with loss of consciousness of unspecified duration, initial encounter: Secondary | ICD-10-CM | POA: Diagnosis present

## 2020-08-04 DIAGNOSIS — I25810 Atherosclerosis of coronary artery bypass graft(s) without angina pectoris: Secondary | ICD-10-CM

## 2020-08-04 DIAGNOSIS — Z8249 Family history of ischemic heart disease and other diseases of the circulatory system: Secondary | ICD-10-CM

## 2020-08-04 DIAGNOSIS — Z7902 Long term (current) use of antithrombotics/antiplatelets: Secondary | ICD-10-CM

## 2020-08-04 DIAGNOSIS — Z741 Need for assistance with personal care: Secondary | ICD-10-CM | POA: Diagnosis not present

## 2020-08-04 DIAGNOSIS — E872 Acidosis: Secondary | ICD-10-CM | POA: Diagnosis present

## 2020-08-04 DIAGNOSIS — G479 Sleep disorder, unspecified: Secondary | ICD-10-CM

## 2020-08-04 DIAGNOSIS — S065X3D Traumatic subdural hemorrhage with loss of consciousness of 1 hour to 5 hours 59 minutes, subsequent encounter: Secondary | ICD-10-CM | POA: Diagnosis not present

## 2020-08-04 DIAGNOSIS — E43 Unspecified severe protein-calorie malnutrition: Secondary | ICD-10-CM | POA: Diagnosis present

## 2020-08-04 DIAGNOSIS — F32A Depression, unspecified: Secondary | ICD-10-CM | POA: Diagnosis present

## 2020-08-04 DIAGNOSIS — E0781 Sick-euthyroid syndrome: Secondary | ICD-10-CM | POA: Diagnosis present

## 2020-08-04 DIAGNOSIS — Z7982 Long term (current) use of aspirin: Secondary | ICD-10-CM

## 2020-08-04 DIAGNOSIS — K219 Gastro-esophageal reflux disease without esophagitis: Secondary | ICD-10-CM | POA: Diagnosis present

## 2020-08-04 DIAGNOSIS — E876 Hypokalemia: Secondary | ICD-10-CM | POA: Diagnosis not present

## 2020-08-04 DIAGNOSIS — Z992 Dependence on renal dialysis: Secondary | ICD-10-CM

## 2020-08-04 DIAGNOSIS — N186 End stage renal disease: Secondary | ICD-10-CM | POA: Diagnosis present

## 2020-08-04 DIAGNOSIS — I2510 Atherosclerotic heart disease of native coronary artery without angina pectoris: Secondary | ICD-10-CM | POA: Diagnosis present

## 2020-08-04 DIAGNOSIS — Z4901 Encounter for fitting and adjustment of extracorporeal dialysis catheter: Secondary | ICD-10-CM | POA: Diagnosis not present

## 2020-08-04 DIAGNOSIS — F419 Anxiety disorder, unspecified: Secondary | ICD-10-CM | POA: Diagnosis not present

## 2020-08-04 DIAGNOSIS — E46 Unspecified protein-calorie malnutrition: Secondary | ICD-10-CM | POA: Diagnosis not present

## 2020-08-04 DIAGNOSIS — Z888 Allergy status to other drugs, medicaments and biological substances status: Secondary | ICD-10-CM | POA: Diagnosis not present

## 2020-08-04 DIAGNOSIS — R627 Adult failure to thrive: Secondary | ICD-10-CM | POA: Diagnosis present

## 2020-08-04 DIAGNOSIS — R569 Unspecified convulsions: Secondary | ICD-10-CM | POA: Diagnosis not present

## 2020-08-04 DIAGNOSIS — E871 Hypo-osmolality and hyponatremia: Secondary | ICD-10-CM | POA: Diagnosis present

## 2020-08-04 DIAGNOSIS — Z880 Allergy status to penicillin: Secondary | ICD-10-CM

## 2020-08-04 DIAGNOSIS — K3184 Gastroparesis: Secondary | ICD-10-CM | POA: Diagnosis present

## 2020-08-04 DIAGNOSIS — R5381 Other malaise: Secondary | ICD-10-CM | POA: Diagnosis present

## 2020-08-04 DIAGNOSIS — R9431 Abnormal electrocardiogram [ECG] [EKG]: Secondary | ICD-10-CM | POA: Diagnosis not present

## 2020-08-04 DIAGNOSIS — E8770 Fluid overload, unspecified: Secondary | ICD-10-CM | POA: Diagnosis present

## 2020-08-04 DIAGNOSIS — Z7409 Other reduced mobility: Secondary | ICD-10-CM | POA: Diagnosis present

## 2020-08-04 DIAGNOSIS — I120 Hypertensive chronic kidney disease with stage 5 chronic kidney disease or end stage renal disease: Secondary | ICD-10-CM | POA: Diagnosis present

## 2020-08-04 DIAGNOSIS — I214 Non-ST elevation (NSTEMI) myocardial infarction: Secondary | ICD-10-CM | POA: Diagnosis not present

## 2020-08-04 DIAGNOSIS — G8918 Other acute postprocedural pain: Secondary | ICD-10-CM | POA: Diagnosis not present

## 2020-08-04 DIAGNOSIS — Z79890 Hormone replacement therapy (postmenopausal): Secondary | ICD-10-CM

## 2020-08-04 DIAGNOSIS — Z8616 Personal history of COVID-19: Secondary | ICD-10-CM

## 2020-08-04 DIAGNOSIS — S065X3S Traumatic subdural hemorrhage with loss of consciousness of 1 hour to 5 hours 59 minutes, sequela: Secondary | ICD-10-CM | POA: Diagnosis not present

## 2020-08-04 DIAGNOSIS — D638 Anemia in other chronic diseases classified elsewhere: Secondary | ICD-10-CM | POA: Diagnosis not present

## 2020-08-04 DIAGNOSIS — Z87898 Personal history of other specified conditions: Secondary | ICD-10-CM

## 2020-08-04 DIAGNOSIS — E8809 Other disorders of plasma-protein metabolism, not elsewhere classified: Secondary | ICD-10-CM | POA: Diagnosis present

## 2020-08-04 DIAGNOSIS — Z681 Body mass index (BMI) 19 or less, adult: Secondary | ICD-10-CM

## 2020-08-04 DIAGNOSIS — D649 Anemia, unspecified: Secondary | ICD-10-CM

## 2020-08-04 DIAGNOSIS — S065X9D Traumatic subdural hemorrhage with loss of consciousness of unspecified duration, subsequent encounter: Principal | ICD-10-CM

## 2020-08-04 DIAGNOSIS — E8779 Other fluid overload: Secondary | ICD-10-CM | POA: Diagnosis not present

## 2020-08-04 DIAGNOSIS — D631 Anemia in chronic kidney disease: Secondary | ICD-10-CM | POA: Diagnosis present

## 2020-08-04 DIAGNOSIS — S065XAA Traumatic subdural hemorrhage with loss of consciousness status unknown, initial encounter: Secondary | ICD-10-CM | POA: Diagnosis present

## 2020-08-04 DIAGNOSIS — S065X0D Traumatic subdural hemorrhage without loss of consciousness, subsequent encounter: Secondary | ICD-10-CM | POA: Diagnosis not present

## 2020-08-04 DIAGNOSIS — Z79899 Other long term (current) drug therapy: Secondary | ICD-10-CM

## 2020-08-04 DIAGNOSIS — R109 Unspecified abdominal pain: Secondary | ICD-10-CM

## 2020-08-04 DIAGNOSIS — R112 Nausea with vomiting, unspecified: Secondary | ICD-10-CM | POA: Diagnosis not present

## 2020-08-04 DIAGNOSIS — M6281 Muscle weakness (generalized): Secondary | ICD-10-CM | POA: Diagnosis not present

## 2020-08-04 DIAGNOSIS — Z87440 Personal history of urinary (tract) infections: Secondary | ICD-10-CM

## 2020-08-04 DIAGNOSIS — A0472 Enterocolitis due to Clostridium difficile, not specified as recurrent: Secondary | ICD-10-CM | POA: Diagnosis not present

## 2020-08-04 DIAGNOSIS — Z955 Presence of coronary angioplasty implant and graft: Secondary | ICD-10-CM | POA: Diagnosis not present

## 2020-08-04 DIAGNOSIS — G8929 Other chronic pain: Secondary | ICD-10-CM

## 2020-08-04 DIAGNOSIS — W19XXXA Unspecified fall, initial encounter: Secondary | ICD-10-CM | POA: Diagnosis not present

## 2020-08-04 DIAGNOSIS — I1311 Hypertensive heart and chronic kidney disease without heart failure, with stage 5 chronic kidney disease, or end stage renal disease: Secondary | ICD-10-CM | POA: Diagnosis not present

## 2020-08-04 DIAGNOSIS — I9589 Other hypotension: Secondary | ICD-10-CM | POA: Diagnosis present

## 2020-08-04 DIAGNOSIS — R278 Other lack of coordination: Secondary | ICD-10-CM | POA: Diagnosis not present

## 2020-08-04 DIAGNOSIS — I469 Cardiac arrest, cause unspecified: Secondary | ICD-10-CM | POA: Diagnosis not present

## 2020-08-04 HISTORY — DX: Dependence on renal dialysis: N18.6

## 2020-08-04 HISTORY — DX: End stage renal disease: Z99.2

## 2020-08-04 HISTORY — DX: Elevated white blood cell count, unspecified: D72.829

## 2020-08-04 MED ORDER — PROCHLORPERAZINE 25 MG RE SUPP: 12.5000 mg | Freq: Four times a day (QID) | RECTAL | Status: DC | PRN

## 2020-08-04 MED ORDER — GENTAMICIN SULFATE 0.1 % EX CREA: TOPICAL_CREAM | Freq: Every day | CUTANEOUS | Status: DC

## 2020-08-04 MED ORDER — MELATONIN 5 MG PO TABS
5.0000 mg | ORAL_TABLET | Freq: Every day | ORAL | Status: DC
  Administered 2020-08-04 – 2020-09-14 (×42): 5 mg via ORAL
  Filled 2020-08-04 (×42): qty 1

## 2020-08-04 MED ORDER — GUAIFENESIN-DM 100-10 MG/5ML PO SYRP: 5.0000 mL | ORAL_SOLUTION | Freq: Four times a day (QID) | ORAL | Status: DC | PRN

## 2020-08-04 MED ORDER — CITALOPRAM HYDROBROMIDE 10 MG PO TABS
10.0000 mg | ORAL_TABLET | Freq: Every day | ORAL | Status: DC
  Administered 2020-08-05 – 2020-09-08 (×35): 10 mg via ORAL
  Filled 2020-08-04 (×35): qty 1

## 2020-08-04 MED ORDER — BISACODYL 10 MG RE SUPP
10.0000 mg | Freq: Every day | RECTAL | Status: DC | PRN
Start: 2020-08-04 — End: 2020-09-15

## 2020-08-04 MED ORDER — PROCHLORPERAZINE MALEATE 5 MG PO TABS
5.0000 mg | ORAL_TABLET | Freq: Four times a day (QID) | ORAL | Status: DC | PRN
  Administered 2020-08-05 – 2020-08-06 (×2): 10 mg via ORAL
  Filled 2020-08-04 (×2): qty 2

## 2020-08-04 MED ORDER — HEPARIN 1000 UNIT/ML FOR PERITONEAL DIALYSIS
500.0000 [IU] | INTRAMUSCULAR | Status: DC | PRN
Start: 2020-08-04 — End: 2020-08-04

## 2020-08-04 MED ORDER — BRIVARACETAM 25 MG PO TABS
75.0000 mg | ORAL_TABLET | Freq: Two times a day (BID) | ORAL | Status: DC
  Administered 2020-08-04 – 2020-09-15 (×84): 75 mg via ORAL
  Filled 2020-08-04 (×86): qty 3

## 2020-08-04 MED ORDER — GENTAMICIN SULFATE 0.1 % EX CREA
1.0000 "application " | TOPICAL_CREAM | Freq: Every day | CUTANEOUS | Status: DC
  Administered 2020-08-05 – 2020-08-15 (×8): 1 via TOPICAL
  Filled 2020-08-04: qty 15

## 2020-08-04 MED ORDER — HEPARIN SODIUM (PORCINE) 5000 UNIT/ML IJ SOLN
5000.0000 [IU] | Freq: Two times a day (BID) | INTRAMUSCULAR | Status: DC
  Administered 2020-08-04 – 2020-09-01 (×56): 5000 [IU] via SUBCUTANEOUS
  Filled 2020-08-04 (×56): qty 1

## 2020-08-04 MED ORDER — HEPARIN 1000 UNIT/ML FOR PERITONEAL DIALYSIS
INTRAPERITONEAL | Status: DC | PRN
  Filled 2020-08-04: qty 3000

## 2020-08-04 MED ORDER — LACOSAMIDE 50 MG PO TABS
75.0000 mg | ORAL_TABLET | Freq: Two times a day (BID) | ORAL | Status: DC
  Administered 2020-08-04 – 2020-08-27 (×46): 75 mg via ORAL
  Filled 2020-08-04 (×46): qty 2

## 2020-08-04 MED ORDER — ACETAMINOPHEN 325 MG PO TABS
325.0000 mg | ORAL_TABLET | ORAL | Status: DC | PRN
  Administered 2020-08-05 – 2020-09-14 (×21): 650 mg via ORAL
  Filled 2020-08-04 (×22): qty 2
  Filled 2020-08-04: qty 1
  Filled 2020-08-04 (×7): qty 2

## 2020-08-04 MED ORDER — TRAZODONE HCL 50 MG PO TABS
25.0000 mg | ORAL_TABLET | Freq: Every evening | ORAL | Status: DC | PRN
  Administered 2020-08-06 – 2020-08-27 (×20): 50 mg via ORAL
  Administered 2020-09-07 (×2): 25 mg via ORAL
  Administered 2020-09-10 – 2020-09-14 (×3): 50 mg via ORAL
  Filled 2020-08-04 (×25): qty 1

## 2020-08-04 MED ORDER — ASPIRIN EC 81 MG PO TBEC
81.0000 mg | DELAYED_RELEASE_TABLET | Freq: Every day | ORAL | Status: DC
  Administered 2020-08-05 – 2020-09-15 (×42): 81 mg via ORAL
  Filled 2020-08-04 (×42): qty 1

## 2020-08-04 MED ORDER — METOCLOPRAMIDE HCL 5 MG PO TABS
5.0000 mg | ORAL_TABLET | Freq: Three times a day (TID) | ORAL | Status: DC
  Administered 2020-08-04 – 2020-08-06 (×5): 5 mg via ORAL
  Filled 2020-08-04 (×5): qty 1

## 2020-08-04 MED ORDER — METHOCARBAMOL 500 MG PO TABS
500.0000 mg | ORAL_TABLET | Freq: Four times a day (QID) | ORAL | Status: DC | PRN
  Administered 2020-08-05 – 2020-09-14 (×22): 500 mg via ORAL
  Filled 2020-08-04 (×24): qty 1

## 2020-08-04 MED ORDER — ALUM & MAG HYDROXIDE-SIMETH 200-200-20 MG/5ML PO SUSP: 30.0000 mL | ORAL | Status: DC | PRN

## 2020-08-04 MED ORDER — METOPROLOL TARTRATE 25 MG/10 ML ORAL SUSPENSION
6.2500 mg | Freq: Two times a day (BID) | ORAL | Status: DC
  Administered 2020-08-04 – 2020-09-15 (×82): 6.25 mg via ORAL
  Filled 2020-08-04 (×86): qty 5

## 2020-08-04 MED ORDER — DOCUSATE SODIUM 50 MG/5ML PO LIQD
50.0000 mg | Freq: Two times a day (BID) | ORAL | Status: DC
  Administered 2020-08-04 – 2020-08-06 (×2): 50 mg via ORAL
  Filled 2020-08-04 (×7): qty 10

## 2020-08-04 MED ORDER — ATORVASTATIN CALCIUM 40 MG PO TABS
40.0000 mg | ORAL_TABLET | Freq: Every day | ORAL | Status: DC
  Administered 2020-08-05 – 2020-09-15 (×42): 40 mg via ORAL
  Filled 2020-08-04 (×42): qty 1

## 2020-08-04 MED ORDER — KIDNEY FAILURE BOOK: Freq: Once | Status: DC

## 2020-08-04 MED ORDER — CAMPHOR-MENTHOL 0.5-0.5 % EX LOTN
TOPICAL_LOTION | CUTANEOUS | Status: DC | PRN
  Filled 2020-08-04: qty 222

## 2020-08-04 MED ORDER — POLYETHYLENE GLYCOL 3350 17 G PO PACK
17.0000 g | PACK | Freq: Every day | ORAL | Status: DC | PRN
  Administered 2020-08-25: 17 g via ORAL

## 2020-08-04 MED ORDER — SODIUM BICARBONATE 650 MG PO TABS
650.0000 mg | ORAL_TABLET | Freq: Two times a day (BID) | ORAL | Status: DC
  Administered 2020-08-04 – 2020-08-15 (×22): 650 mg via ORAL
  Filled 2020-08-04 (×22): qty 1

## 2020-08-04 MED ORDER — MELATONIN 5 MG PO TABS
5.0000 mg | ORAL_TABLET | Freq: Every evening | ORAL | Status: DC | PRN
  Administered 2020-08-05 – 2020-09-14 (×13): 5 mg via ORAL
  Filled 2020-08-04 (×15): qty 1

## 2020-08-04 MED ORDER — DELFLEX-LC/1.5% DEXTROSE 344 MOSM/L IP SOLN: INTRAPERITONEAL | Status: DC

## 2020-08-04 MED ORDER — PANTOPRAZOLE SODIUM 40 MG PO TBEC
40.0000 mg | DELAYED_RELEASE_TABLET | Freq: Every day | ORAL | Status: DC
  Administered 2020-08-05 – 2020-09-15 (×42): 40 mg via ORAL
  Filled 2020-08-04 (×42): qty 1

## 2020-08-04 MED ORDER — METOCLOPRAMIDE HCL 5 MG/ML IJ SOLN: 5.0000 mg | Freq: Three times a day (TID) | INTRAMUSCULAR | Status: DC

## 2020-08-04 MED ORDER — PROCHLORPERAZINE EDISYLATE 10 MG/2ML IJ SOLN: 5.0000 mg | Freq: Four times a day (QID) | INTRAMUSCULAR | Status: DC | PRN

## 2020-08-04 MED ORDER — BUTALBITAL-APAP-CAFFEINE 50-325-40 MG PO TABS
1.0000 | ORAL_TABLET | Freq: Three times a day (TID) | ORAL | Status: DC
  Administered 2020-08-04 – 2020-09-01 (×76): 1 via ORAL
  Filled 2020-08-04 (×74): qty 1

## 2020-08-04 MED ORDER — TRIAMCINOLONE ACETONIDE 0.1 % EX CREA
TOPICAL_CREAM | Freq: Three times a day (TID) | CUTANEOUS | Status: DC
  Administered 2020-08-14 – 2020-09-04 (×5): 1 via TOPICAL
  Filled 2020-08-04 (×3): qty 15

## 2020-08-04 MED ORDER — FLEET ENEMA 7-19 GM/118ML RE ENEM: 1.0000 | ENEMA | Freq: Once | RECTAL | Status: DC | PRN

## 2020-08-04 NOTE — Progress Notes (Signed)
Inpatient Rehabilitation Admissions Coordinator  I contacted Freda Munro, in Hemodialysis of admit for [name] needs as well as contacted Dr. Jonnie Finner of planned admit today.  Danne Baxter, RN, MSN Rehab Admissions Coordinator [phone] 08/04/2020 1:47 PM

## 2020-08-04 NOTE — Progress Notes (Signed)
Patient admitted to Rehab from [place] approximately at 1345 via stretcher with medic. Report received from nurse earlier this AM. Patient alert and oriented x4. PERRLA. Lung sounds present x4, diminished to bases. Pulses +2 to all 4 extremities. Bowel sounds normal and active x4; LBM 08/03/20 per report. Continent of B/B, with use of bedpan at this time. Generalized weakness noted to BLE and BUE. Scattered discolorations noted to BUE and BLE. Admission skin assessment performed with Dorien Chihuahua, RN. No complaints of pain or discomfort noted at this time. Call bell in reach. Will continue to monitor.

## 2020-08-04 NOTE — Progress Notes (Signed)
Inpatient Rehabilitation  Patient information reviewed and entered into eRehab system by Sammy Douthitt M. Mauriah Mcmillen, M.A., CCC/SLP, PPS Coordinator.  Information including medical coding, functional ability and quality indicators will be reviewed and updated through discharge.    

## 2020-08-04 NOTE — H&P (Signed)
Physical Medicine and Rehabilitation Admission H&P    CC: Functional deficits due to SDH  HPI:  Amy [name]. Amy [name] with history of ESRD-on PD, chronic hyponatremia, chronic daily HA, multiple admissions most recent significant for C diff colitis, new onset seizures, NSTEMI s/p PCI w/DAPT, chronic issues with N/V resulting in debility and CIR stay 03/04-03/24/22 . She was discharged to home and reports of mild fall that afternoon followed by obtundation  that evening. She was evaluated at [facility] 07/24/20 and  found to have acute on chronic Left SDH with near complete effacement of left lateral ventricle and 10 mm midline shift. She was transferred to [facility] and underwent left partial craniotomy for evacuation of SDH with placement of subdural drain which was removed on 03/28.   She had worsening of aphasia with 2 subclinical seizures therefore keppra was switched to brivaracetam on 03/27 and she was placed on LTM-EEG. She had partial motor seizure with facial jerking and difficulty speaking on 03/28 and Vimpat added with resolution of seizures-->dose was decreased to 75 mg bid on 03/31 due to sedative SE.    2D echo done 03/26 revealing EF>55% with hypercontractile LV and no masses.  She developed LUE edema and was found to have superficial thrombus involving basilic vein.  BLE dopplers 04/01 were negative for DVT. PD ongoing with chronic hyponateremia-Na-130 as well as ongoing issues with hypotension. Fibrin strands reported in PD and addition of heparin was cleared by NS.  Sepsis work up initiated due to hypotension with leucocytosis and she was treated with Vanc X 4 days/Aztreonam X 5 days due to concerns of UTI.  Cortack briefly placed for nutritional support per RD recommendations   Review of Systems  Constitutional: Negative for chills and fever.  HENT: Negative for ear pain.   Eyes: Negative for pain and discharge.  Respiratory: Negative for shortness of breath.   Cardiovascular: Negative for  chest pain and palpitations.  Gastrointestinal: Positive for nausea. Negative for abdominal pain and diarrhea.  Genitourinary: Negative.   Musculoskeletal: Positive for back pain.  Neurological: Positive for speech change, focal weakness and headaches.  Psychiatric/Behavioral: Negative for depression.      Past Medical History:  Diagnosis Date  . Anemia    a. 08/2016 Guaiac + stool. EGD w/ gastritis.  . Anxiety   . CKD (chronic kidney disease), stage III ([place])   . Closed fracture of shaft of left ulna 11/27/2018  . Colon polyp    a. 08/2016 Colonoscopy.  . Gastritis    a. 08/2016 EGD: Gastritis-->PPI.  [name] GERD (gastroesophageal reflux disease)   . History of chicken pox   . History of measles as a child   . Hypertension   . Hyponatremia    a. 08/2016 in setting of HCTZ Rx.  . Mesenteric lymphadenopathy 04/08/2017  . Multiple thyroid nodules     Past Surgical History:  Procedure Laterality Date  . ABDOMINAL HYSTERECTOMY  2003   due to heavy periods and clotting during menses  . BREAST LUMPECTOMY Left 1990  . CHOLECYSTECTOMY  2010  . COLONOSCOPY WITH PROPOFOL N/A 09/07/2016   Procedure: COLONOSCOPY WITH PROPOFOL;  Surgeon: Lucilla Lame, MD;  Location: [facility] ENDOSCOPY;  Service: Endoscopy;  Laterality: N/A;  . CORONARY STENT INTERVENTION N/A 06/27/2020   Procedure: CORONARY STENT INTERVENTION;  Surgeon: Sherren Mocha, MD;  Location: [place] CV LAB;  Service: Cardiovascular;  Laterality: N/A;  . DIALYSIS/PERMA CATHETER INSERTION N/A 11/16/2018   Procedure: DIALYSIS/PERMA CATHETER INSERTION;  Surgeon: Leotis Pain  S, MD;  Location: [place] CV LAB;  Service: Cardiovascular;  Laterality: N/A;  . DIALYSIS/PERMA CATHETER REMOVAL N/A 08/16/2019   Procedure: DIALYSIS/PERMA CATHETER REMOVAL;  Surgeon: Algernon Huxley, MD;  Location: [place] CV LAB;  Service: Cardiovascular;  Laterality: N/A;  . DILATION AND CURETTAGE OF UTERUS  1985  . ESOPHAGOGASTRODUODENOSCOPY (EGD) WITH PROPOFOL  N/A 09/07/2016   Procedure: ESOPHAGOGASTRODUODENOSCOPY (EGD) WITH PROPOFOL;  Surgeon: Lucilla Lame, MD;  Location: [facility] ENDOSCOPY;  Service: Endoscopy;  Laterality: N/A;  . GALLBLADDER SURGERY  2012  . INTRAMEDULLARY (IM) NAIL INTERTROCHANTERIC Right 04/23/2020   Procedure: INTRAMEDULLARY (IM) NAIL INTERTROCHANTRIC;  Surgeon: Thornton Park, MD;  Location: [facility] ORS;  Service: Orthopedics;  Laterality: Right;  . INTRAVASCULAR IMAGING/OCT N/A 06/27/2020   Procedure: INTRAVASCULAR IMAGING/OCT;  Surgeon: Sherren Mocha, MD;  Location: [place] CV LAB;  Service: Cardiovascular;  Laterality: N/A;  . KNEE SURGERY     Dr. Tamala Julian  . LEFT HEART CATH AND CORONARY ANGIOGRAPHY N/A 06/25/2020   Procedure: LEFT HEART CATH AND CORONARY ANGIOGRAPHY;  Surgeon: Troy Sine, MD;  Location: [place] CV LAB;  Service: Cardiovascular;  Laterality: N/A;  . TONSILLECTOMY    . TONSILLECTOMY AND ADENOIDECTOMY  1970  . UPPER GI ENDOSCOPY  03/02/2006   H. Pylori negative; Dr. Allen Norris   Family History  Problem Relation Age of Onset  . Pancreatic cancer Father   . Congestive Heart Failure Maternal Grandmother   . Cancer Paternal Grandfather   . Heart disease Maternal Uncle     Social History:  Married. Husband works days. She reports that she has never smoked. She has never used smokeless tobacco. She reports current alcohol use. She reports that she does not use drugs.   Allergies  Allergen Reactions  . Cephalosporins Hives and Swelling  . Clarithromycin Other (See Comments)    Unknown reaction  . Gabapentin   . Lunesta  [Eszopiclone]     Heart racing  . Penicillin V     Has patient had a PCN reaction causing immediate rash, facial/tongue/throat swelling, SOB or lightheadedness with hypotension: Yes Has patient had a PCN reaction causing severe rash involving mucus membranes or skin necrosis: No Has patient had a PCN reaction that required hospitalization No Has patient had a PCN reaction occurring  within the last 10 years: No If all of the above answers are "NO", then may proceed with Cephalosporin use.  . Sulfa Antibiotics     Unknown reaction   Medications Prior to Admission  Medication Sig Dispense Refill  . aspirin 81 MG chewable tablet Chew 1 tablet (81 mg total) by mouth daily.    [name] atorvastatin (LIPITOR) 80 MG tablet Take 1 tablet (80 mg total) by mouth daily at 6 PM. 30 tablet 0  . atorvastatin (LIPITOR) 80 MG tablet TAKE 1 TABLET (80 MG TOTAL) BY MOUTH DAILY AT 6 PM. 30 tablet 0  . butalbital-acetaminophen-caffeine (FIORICET) 50-325-40 MG tablet TAKE 1 TO 2 TABLETS BY MOUTH EVERY 4 HOURS AS NEEDED FOR HEADACHE (Patient taking differently: Take 2 tablets by mouth every 6 (six) hours as needed for headache.) 150 tablet 5  . calcium acetate (PHOSLO) 667 MG capsule Take 667 mg by mouth 3 (three) times daily.    . clopidogrel (PLAVIX) 75 MG tablet Take 1 tablet (75 mg total) by mouth daily. 30 tablet 1  . clopidogrel (PLAVIX) 75 MG tablet TAKE 1 TABLET (75 MG TOTAL) BY MOUTH DAILY. 30 tablet 1  . esomeprazole (NEXIUM) 20 MG capsule Take  20 mg by mouth daily.    [name] gentamicin cream (GARAMYCIN) 0.1 % Apply 1 application topically daily. 30 g 0  . levETIRAcetam (KEPPRA) 500 MG tablet Take 1 tablet (500 mg total) by mouth 2 (two) times daily. 60 tablet 1  . levETIRAcetam (KEPPRA) 500 MG tablet TAKE 1 TABLET (500 MG TOTAL) BY MOUTH TWO TIMES DAILY. 60 tablet 1  . melatonin 3 MG TABS tablet Take 1 tablet (3 mg total) by mouth at bedtime. 30 tablet 0  . metoCLOPramide (REGLAN) 5 MG tablet Take 1 tablet (5 mg total) by mouth daily at 6 (six) AM. 30 tablet 0  . metoprolol succinate (TOPROL-XL) 25 MG 24 hr tablet Take 1 tablet (25 mg total) by mouth daily. 30 tablet 0  . metoprolol succinate (TOPROL-XL) 25 MG 24 hr tablet TAKE 1 TABLET (25 MG TOTAL) BY MOUTH DAILY. 30 tablet 0  . pantoprazole (PROTONIX) 40 MG tablet Take 1 tablet (40 mg total) by mouth daily. 30 tablet 0  . pantoprazole  (PROTONIX) 40 MG tablet TAKE 1 TABLET (40 MG TOTAL) BY MOUTH DAILY. 30 tablet 1  . prochlorperazine (COMPAZINE) 10 MG tablet Take 1 tablet (10 mg total) by mouth every 8 (eight) hours as needed for nausea or vomiting. 30 tablet 0  . saccharomyces boulardii (FLORASTOR) 250 MG capsule Take 1 capsule (250 mg total) by mouth 2 (two) times daily. 60 capsule 0  . zolpidem (AMBIEN) 10 MG tablet Take 10 mg by mouth at bedtime as needed.      Drug Regimen Review  Drug regimen was reviewed and remains appropriate with no significant issues identified  Home: 2 level home--has BR on 1st level.    Functional History:    Functional Status:  Mobility: Mod assist for tranfers     ADL: Mod assist for grooming Mod assist for upper body dressing.  Max to total assist with LB dressing Mod assist for toilet tranfers.   Cognition: Expressive deficits noted.    Physical Exam: There were no vitals taken for this visit. Physical Exam Constitutional:      General: She is not in acute distress.    Appearance: She is ill-appearing.  HENT:     Head:     Comments: Left crani incision CDI    Nose: Nose normal.  Eyes:     Pupils: Pupils are equal, round, and reactive to light.  Cardiovascular:     Rate and Rhythm: Normal rate and regular rhythm.     Heart sounds: No murmur heard. No gallop.   Pulmonary:     Effort: Pulmonary effort is normal. No respiratory distress.     Breath sounds: No wheezing or rales.  Abdominal:     General: There is no distension.     Palpations: Abdomen is soft. There is no mass.     Tenderness: There is no abdominal tenderness. There is no guarding.     Comments: PD cath/site clean, dry  Musculoskeletal:        General: No swelling or tenderness.  Skin:    Comments: Ruptured blister distal, medial left thigh. Several other scattered bruises, R>L limbs.   Neurological:     Comments: Pt alert, oriented to date, place, name, why she's here. Word finding deficits  but able to convey thoughts and carry a limited conversation. LUE 4-/5 LLE 3-4/5. RUE 3-4/5 RLE 2-3/5 prox to distal. Sensed pain and light touch in all 4's.   Psychiatric:        Mood  and Affect: Mood normal.       LAB RESULTS FROM [facility] CBC 07/31/20--HGB--9.0   HCT: 27.7    WBC: 9.9    PLT: 200 BMET 08/03/20--Na- 130   K+4.2    Cl- 99  CO2- 21   BUN-25   SCr- 2.8   Ca- 7.9    Phosphorus 08/01/20-- 3.2  Ionized calcium 07/30/20--1.17  Hgb A1C- 4.6   Medical Problem List and Plan: 1.  Right hemiparesis and aphasia secondary to large left SDH s/p craniotomy  -patient may shower  -ELOS/Goals: 14 days, supervision to min assist with PT, OT, SLP 2.  Antithrombotics: -DVT/anticoagulation:  Mechanical: Sequential compression devices, below knee Bilateral lower extremities  -antiplatelet therapy: N/A due to bleed.  3. Chronic migraines/ Pain Management: Uses Fioricet 3-4 times a day on average.  4. Mood: LCSW to follow for evaluation and support.   -antipsychotic agents: N/A 5. Neuropsych: This patient is not yet capable of making decisions on her own behalf. 6. Skin/Wound Care: Monitor wound for healing.  --offer supplements. Family to provide meals from  Home as able.  7. Fluids/Electrolytes/Nutrition: Continue PD--does not need fluid restriction.   --has had chronic issues with variable intake as well as N/V likely due to gastroparesis. 8. Chronic Hypotension:  TEDs when out of bed  --encourage intake. 9. ESRD-PD: Nephrology to assist/manage PD.   --chronic hyponatremia with Na-130-133 range  --Sodium bicarb to manage metabolic acidosis. 10.  Recurrent Seizures: Has been seizure free on Brivaracetam and Lacosamide bid.   --Lacosamide was decreased on 03/31 to avoid sedative SE.  -observe for recurrent sz activity  -optimize sleep, appropriate nutrition  11. CAD/NSTEMI s/p PCI: Treated medically. Off DAPT due to bleed/Metoprolol being held due to hypotension.  --Continue Lipitor.   12. Acute on chronic anemia: Monitor with serial checks.  13. H/o Depression: On Celexa 14. Sick Euthyroid: Will need follow up as illness resolves.   --TSH- 5.80/Free T4-1.2 (03/27) 15. Gastroparesis w/chronic N/V/anorexia: Has been ongoing per records review.   --question failure of PD (husband reports no N/V with brief episode of HD in the past)     I have personally performed a face to face diagnostic evaluation of this patient and formulated the key components of the plan.  Additionally, I have personally reviewed laboratory data, imaging studies, as well as relevant notes and concur with the physician assistant's documentation above.  The patient's status has not changed from the original H&P.  Any changes in documentation from the acute care chart have been noted above.  Meredith Staggers, MD, FAAPMR   Meredith Staggers, MD 08/04/2020   Reesa Chew, PAC

## 2020-08-04 NOTE — Consult Note (Signed)
Renal Service Consult Note [facility]  Amy [name] 08/04/2020 [name], MD Requesting Physician: Dr. Posey Pronto, A.   Reason for Consult: ESRD pt on PD sp SDH evac, admitting to CIR HPI: The patient is a 70 y.o. year-old w/ hx of HTN, hyponatremia chronic, ESRD on PD f/b [facility], CAD sp recent LAD stent Feb 2022, h/o Cdif, wt loss , chronic debility who was recently admitted to [facility] for NSTEMI was rx'd w/ LAD stent (not surg candidate) and had rehab stay here on CIR. Was dc'd home on 07/24/20.  Pt fell at home and had emergency SDH evacuation surgery at [facility]. Had some seizures as complication. No PD issues per family. Pt is now admitted to CIR here for her debility. Asked to see again for ESRD/ PD.   Pt seen in room. No c/o per the pt.  Has swelling of hips and arms.  No PD problems at [facility].     ROS  denies CP  no joint pain   no HA  no blurry vision  no rash  no diarrhea  no nausea/ vomiting     Past Medical History  Past Medical History:  Diagnosis Date  . Anemia    a. 08/2016 Guaiac + stool. EGD w/ gastritis.  . Anxiety   . CKD (chronic kidney disease), stage III ([place])   . Closed fracture of shaft of left ulna 11/27/2018  . Colon polyp    a. 08/2016 Colonoscopy.  . Gastritis    a. 08/2016 EGD: Gastritis-->PPI.  [name] GERD (gastroesophageal reflux disease)   . History of chicken pox   . History of measles as a child   . Hypertension   . Hyponatremia    a. 08/2016 in setting of HCTZ Rx.  . Mesenteric lymphadenopathy 04/08/2017  . Multiple thyroid nodules    Past Surgical History  Past Surgical History:  Procedure Laterality Date  . ABDOMINAL HYSTERECTOMY  2003   due to heavy periods and clotting during menses  . BREAST LUMPECTOMY Left 1990  . CHOLECYSTECTOMY  2010  . COLONOSCOPY WITH PROPOFOL N/A 09/07/2016   Procedure: COLONOSCOPY WITH PROPOFOL;  Surgeon: Lucilla Lame, MD;  Location: [facility] ENDOSCOPY;  Service: Endoscopy;  Laterality: N/A;  . CORONARY STENT  INTERVENTION N/A 06/27/2020   Procedure: CORONARY STENT INTERVENTION;  Surgeon: Sherren Mocha, MD;  Location: [place] CV LAB;  Service: Cardiovascular;  Laterality: N/A;  . DIALYSIS/PERMA CATHETER INSERTION N/A 11/16/2018   Procedure: DIALYSIS/PERMA CATHETER INSERTION;  Surgeon: Algernon Huxley, MD;  Location: [place] CV LAB;  Service: Cardiovascular;  Laterality: N/A;  . DIALYSIS/PERMA CATHETER REMOVAL N/A 08/16/2019   Procedure: DIALYSIS/PERMA CATHETER REMOVAL;  Surgeon: Algernon Huxley, MD;  Location: [place] CV LAB;  Service: Cardiovascular;  Laterality: N/A;  . DILATION AND CURETTAGE OF UTERUS  1985  . ESOPHAGOGASTRODUODENOSCOPY (EGD) WITH PROPOFOL N/A 09/07/2016   Procedure: ESOPHAGOGASTRODUODENOSCOPY (EGD) WITH PROPOFOL;  Surgeon: Lucilla Lame, MD;  Location: [facility] ENDOSCOPY;  Service: Endoscopy;  Laterality: N/A;  . GALLBLADDER SURGERY  2012  . INTRAMEDULLARY (IM) NAIL INTERTROCHANTERIC Right 04/23/2020   Procedure: INTRAMEDULLARY (IM) NAIL INTERTROCHANTRIC;  Surgeon: Thornton Park, MD;  Location: [facility] ORS;  Service: Orthopedics;  Laterality: Right;  . INTRAVASCULAR IMAGING/OCT N/A 06/27/2020   Procedure: INTRAVASCULAR IMAGING/OCT;  Surgeon: Sherren Mocha, MD;  Location: [place] CV LAB;  Service: Cardiovascular;  Laterality: N/A;  . KNEE SURGERY     Dr. Tamala Julian  . LEFT HEART CATH AND CORONARY ANGIOGRAPHY N/A 06/25/2020  Procedure: LEFT HEART CATH AND CORONARY ANGIOGRAPHY;  Surgeon: Troy Sine, MD;  Location: [place] CV LAB;  Service: Cardiovascular;  Laterality: N/A;  . TONSILLECTOMY    . TONSILLECTOMY AND ADENOIDECTOMY  1970  . UPPER GI ENDOSCOPY  03/02/2006   H. Pylori negative; Dr. Allen Norris   Family History  Family History  Problem Relation Age of Onset  . Pancreatic cancer Father   . Congestive Heart Failure Maternal Grandmother   . Cancer Paternal Grandfather   . Heart disease Maternal Uncle    Social History  reports that she has never smoked. She has  never used smokeless tobacco. She reports current alcohol use. She reports that she does not use drugs. Allergies  Allergies  Allergen Reactions  . Cephalosporins Hives and Swelling  . Clarithromycin Other (See Comments)    Unknown reaction  . Gabapentin   . Lunesta  [Eszopiclone]     Heart racing  . Penicillin V     Has patient had a PCN reaction causing immediate rash, facial/tongue/throat swelling, SOB or lightheadedness with hypotension: Yes Has patient had a PCN reaction causing severe rash involving mucus membranes or skin necrosis: No Has patient had a PCN reaction that required hospitalization No Has patient had a PCN reaction occurring within the last 10 years: No If all of the above answers are "NO", then may proceed with Cephalosporin use.  . Sulfa Antibiotics     Unknown reaction   Home medications Prior to Admission medications   Medication Sig Start Date End Date Taking? Authorizing Provider  aspirin 81 MG chewable tablet Chew 1 tablet (81 mg total) by mouth daily. 06/30/20   Geradine Girt, DO  atorvastatin (LIPITOR) 80 MG tablet Take 1 tablet (80 mg total) by mouth daily at 6 PM. 06/30/20   Geradine Girt, DO  atorvastatin (LIPITOR) 80 MG tablet TAKE 1 TABLET (80 MG TOTAL) BY MOUTH DAILY AT 6 PM. 06/30/20 06/30/21  Geradine Girt, DO  butalbital-acetaminophen-caffeine (FIORICET) 50-325-40 MG tablet TAKE 1 TO 2 TABLETS BY MOUTH EVERY 4 HOURS AS NEEDED FOR HEADACHE Patient taking differently: Take 2 tablets by mouth every 6 (six) hours as needed for headache. 05/25/20   [name], MD  calcium acetate (PHOSLO) 667 MG capsule Take 667 mg by mouth 3 (three) times daily. 07/18/20   [provider]  clopidogrel (PLAVIX) 75 MG tablet Take 1 tablet (75 mg total) by mouth daily. 06/30/20   Geradine Girt, DO  clopidogrel (PLAVIX) 75 MG tablet TAKE 1 TABLET (75 MG TOTAL) BY MOUTH DAILY. 06/30/20 06/30/21  Geradine Girt, DO  esomeprazole (NEXIUM) 20 MG capsule Take 20  mg by mouth daily. 07/18/20   [provider]  gentamicin cream (GARAMYCIN) 0.1 % Apply 1 application topically daily. 07/24/20   Love, Ivan Anchors, PA-C  levETIRAcetam (KEPPRA) 500 MG tablet Take 1 tablet (500 mg total) by mouth 2 (two) times daily. 07/24/20   Love, Ivan Anchors, PA-C  levETIRAcetam (KEPPRA) 500 MG tablet TAKE 1 TABLET (500 MG TOTAL) BY MOUTH TWO TIMES DAILY. 06/30/20 06/30/21  Eulogio Bear U, DO  melatonin 3 MG TABS tablet Take 1 tablet (3 mg total) by mouth at bedtime. 07/24/20   Love, Ivan Anchors, PA-C  metoCLOPramide (REGLAN) 5 MG tablet Take 1 tablet (5 mg total) by mouth daily at 6 (six) AM. 07/25/20   Love, Ivan Anchors, PA-C  metoprolol succinate (TOPROL-XL) 25 MG 24 hr tablet Take 1 tablet (25 mg total) by mouth  daily. 07/24/20   Love, Ivan Anchors, PA-C  metoprolol succinate (TOPROL-XL) 25 MG 24 hr tablet TAKE 1 TABLET (25 MG TOTAL) BY MOUTH DAILY. 06/30/20 06/30/21  Geradine Girt, DO  pantoprazole (PROTONIX) 40 MG tablet Take 1 tablet (40 mg total) by mouth daily. 07/24/20   Love, Ivan Anchors, PA-C  pantoprazole (PROTONIX) 40 MG tablet TAKE 1 TABLET (40 MG TOTAL) BY MOUTH DAILY. 06/30/20 06/30/21  Geradine Girt, DO  prochlorperazine (COMPAZINE) 10 MG tablet Take 1 tablet (10 mg total) by mouth every 8 (eight) hours as needed for nausea or vomiting. 07/24/20   Love, Ivan Anchors, PA-C  saccharomyces boulardii (FLORASTOR) 250 MG capsule Take 1 capsule (250 mg total) by mouth 2 (two) times daily. 07/24/20   Love, Ivan Anchors, PA-C  zolpidem (AMBIEN) 10 MG tablet Take 10 mg by mouth at bedtime as needed. 07/24/20   [provider]     There were no vitals filed for this visit. Exam General:Alert elderly female,currentlypleasant, NAD Heart:RRR no MRG Lungs:CTA nonlabored room air Abdomen:Positive BS, soft NTND PD site stable Extremities:PD cath in right lower quadrant nontender dressing dry Dialysis Access:No pedal edema  Dialysis Orders: [place]  followed by Dr. Candiss Norse. 4 exchanges overnight, 2000 cc and 1.5 hrs each.   Assessment/Plan: 1. Debility: 2/2 Multiple med issues mainly now SDH recovery, also recent nstemi w/ CAD and low EF.  Readmitted to CIR post SDH evac surgery.  2. Fall w/ subdural hematoma - sp SDH evacuation at [facility] on 07/24/20 3. Vol overload - sig LE dependent edema and UE edema. Up 5- 10kg by wts. Will need extra volume removal w/ PD.  4. ESRD - ON  [name] in [place], [place], f/b Dr Candiss Norse of [place]. Will need labs to see alb/ BUN/ creat. Plan PD tonight and nightly while here.  5. Multivessel CAD with reduced EF 25/30%: by recent cath in Feb 2022. Per TCTS was not CABG candidate. UnderwentPCI to LAD. At [facility] repeat echo per family showed improved EF 55%.  6. Chronic hyponatremia - did not improve last admit w/ extra volume removal. Pt became orthostatic/ dehydrated and required IVF's. Rx w/ salt tabs and /or fluid restriction, vs just watch 7. HTN/volume - continues on metoprolol xl 25 qd 8. Anemiaof ESRD- was getting Aranesp 100 MCG q Sat while here last admit. Awaiting admit labs. Last tsat 91%on 06/20/20. Ferritin was 1131.  9. Nutrition: check albumin. Renal diet.  10. Seizures - per primary team 11. H/o C diff - rx'd in Feb 2022     Rob Daielle Melcher  MD 08/04/2020, 2:43 PM  No results for input(s): WBC, HGB in the last 168 hours. No results for input(s): K, BUN, CREATININE, CALCIUM, PHOS in the last 168 hours.

## 2020-08-04 NOTE — IPOC Note (Addendum)
Individualized overall Plan of Care (IPOC) Patient Details Name: Amy [name] MRN: [id number] DOB: 1950/08/06  Admitting Diagnosis: SDH (subdural hematoma) [facility])  Hospital Problems: Principal Problem:   SDH (subdural hematoma) ([place]) Active Problems:   Subdural hemorrhage following injury ([place])   Acute on chronic anemia   Postoperative pain      Functional Problem List: Nursing Medication Management,Safety,Pain,Edema,Endurance,Skin Integrity  PT Balance,Edema,Endurance,Motor,Safety  OT Balance,Edema,Endurance,Motor,Pain,Skin Integrity,Vision  SLP Cognition  TR         Basic ADL's: OT Eating,Grooming,Bathing,Dressing,Toileting     Advanced  ADL's: OT       Transfers: PT Bed Mobility,Bed to [employer]  OT Toilet     Locomotion: PT Ambulation,Wheelchair Mobility,Stairs     Additional Impairments: OT Fuctional Use of Upper Extremity  SLP Social Cognition   Problem Solving,Memory,Attention,Awareness  TR      Anticipated Outcomes Item Anticipated Outcome  Self Feeding mod I  Swallowing      Basic self-care  CG/min A  Toileting  min A   Bathroom Transfers CG/min A  Bowel/Bladder  n/a  Transfers  supervision for transfers  Locomotion  CGA household ambulation  Communication     Cognition  Supervision A  Pain  Pain at or below level 4  Safety/Judgment  Maintain safety with cues/reminders   Therapy Plan: PT Intensity: Minimum of 1-2 x/day ,45 to 90 minutes PT Frequency: 5 out of 7 days PT Duration Estimated Length of Stay: 4 weeks OT Intensity: Minimum of 1-2 x/day, 45 to 90 minutes OT Frequency: 5 out of 7 days OT Duration/Estimated Length of Stay: 4 weeks SLP Intensity: Minumum of 1-2 x/day, 30 to 90 minutes SLP Frequency: 3 to 5 out of 7 days SLP Duration/Estimated Length of Stay: 2 weeks    Team Interventions: Nursing Interventions Patient/Family Education,Pain Management,Discharge Planning,Medication Management,Disease  Management/Prevention  PT interventions Ambulation/gait training,Balance/vestibular training,Cognitive remediation/compensation,Functional mobility training,Patient/family education,Therapeutic Exercise,UE/LE Coordination activities,Splinting/orthotics,Therapeutic Activities,Skin care/wound management,Pain management,Discharge planning,DME/adaptive equipment instruction,Neuromuscular re-education,Stair training,UE/LE Strength taining/ROM,Wheelchair propulsion/positioning  OT Interventions Balance/vestibular training,Neuromuscular re-education,Self Care/advanced ADL retraining,Wheelchair propulsion/positioning,Cognitive remediation/compensation,DME/adaptive equipment instruction,Pain management,Skin care/wound managment,Patient/family education,UE/LE Coordination activities,Discharge planning,Functional mobility training,Therapeutic Activities,Visual/perceptual remediation/compensation  SLP Interventions Cognitive remediation/compensation,Cueing hierarchy,Functional tasks,Medication managment,Patient/family education,Internal/external aids  TR Interventions    SW/CM Interventions Discharge Planning,Psychosocial Support,Patient/Family Education   Barriers to Discharge MD  Medical stability, Wound care, and PD  Nursing Decreased caregiver support,Other (comments) (Peritoneal dialysis)    PT Other (comments) previously on peritoneal dialysis  OT      SLP      SW       Team Discharge Planning: Destination: PT-Home ,OT- Home , SLP-Home Projected Follow-up: PT-Home health PT, OT-  Home health OT, SLP-Home Health SLP,Outpatient SLP,24 hour supervision/assistance Projected Equipment Needs: PT-To be determined, OT-  , SLP-None recommended by SLP Equipment Details: PT- , OT-Pt already owns 3in1, shower chair Patient/family involved in discharge planning: PT- Patient,  OT-Patient, SLP-Patient  MD ELOS: 18-22 days. Medical Rehab Prognosis:  Good Assessment: ESRD-on PD, chronic hyponatremia, chronic  daily HA, multiple admissions most recent significant for C diff colitis, new onset seizures, NSTEMI s/p PCI w/DAPT, chronic issues with N/V resulting in debility and CIR stay 03/04-03/24/22 . She was discharged to home and reports of mild fall that afternoon followed by obtundation  that evening. She was evaluated at [facility] 07/24/20 and  found to have acute on chronic Left SDH with near complete effacement of left lateral ventricle and 10 mm midline shift. She was transferred to [facility] and underwent left partial craniotomy for  evacuation of SDH with placement of subdural drain which was removed on 03/28.   She had worsening of aphasia with 2 subclinical seizures therefore keppra was switched to brivaracetam on 03/27 and she was placed on LTM-EEG. She had partial motor seizure with facial jerking and difficulty speaking on 03/28 and Vimpat added with resolution of seizures-->dose was decreased to 75 mg bid on 03/31 due to sedative SE.  2D echo done 03/26 revealing EF>55% with hypercontractile LV and no masses.  She developed LUE edema and was found to have superficial thrombus involving basilic vein.  BLE dopplers 04/01 were negative for DVT. PD ongoing with chronic hyponateremia-Na-130 as well as ongoing issues with hypotension. Fibrin strands reported in PD and addition of heparin was cleared by NS.  Sepsis work up initiated due to hypotension with leucocytosis and she was treated with Vanc X 4 days/Aztreonam X 5 days due to concerns of UTI.  Cortack briefly placed for nutritional support per RD recommendations. Patient with resulting functional deficits with mobility, cognition, self-care, endurance.  Will set goals for Supervision with PT, Min A with OT and Mod I with SLP.  Due to the current state of emergency, patients may not be receiving their 3-hours of Medicare-mandated therapy.  See Team Conference Notes for weekly updates to the plan of care

## 2020-08-04 NOTE — Progress Notes (Signed)
Inpatient Rehabilitation Medication Review by a Pharmacist  A complete drug regimen review was completed for this patient to identify any potential clinically significant medication issues.  Clinically significant medication issues were identified:  no  Check AMION for pharmacist assigned to patient if future medication questions/issues arise during this admission.  Pharmacist comments:   Reviewed transfer paperwork from [place]. Current meds continued. Multiple changes from prior PTA meds made during admission at [facility].  Including:  - holding Plavix (ASA 81 mg resumed on 08/01/20 and heparin 5000 units SQ q12h begun, per OK with Neurosurgery, 7 days post-SDH.  - off prior Keppra > now on Briviact and Vimpat  - off Wellbutrin XL due to seizures > now on Celexa  - off Toprol XL > now on lower dose Metoprolol due to some low BPs  - off PhosLo > PD, renal following, Phos ok, binder held  - off Ambien qhs > now on Melatonin qhs and Trazodone qhs prn  - previously on Atrovastatin 80 mg daily > now on 40 mg  Time spent performing this drug regimen review (minutes):  [place], [place], [place] 08/04/2020 5:07 PM

## 2020-08-04 NOTE — Progress Notes (Signed)
Cristina Gong, RN  Rehab Admission Coordinator  Physical Medicine and Rehabilitation  PMR Pre-admission  Signed  Encounter Date:  07/30/2020      Related encounter: Documentation from 07/30/2020 in [place]       Signed          Show:Clear all [x] Manual[x] Template[] Copied  Added by: [x] Cristina Gong, RN[x] Meredith Staggers, MD   [] Hover for details  PMR Admission Coordinator Pre-Admission Assessment   Patient: Amy [name] is an 70 y.o., female MRN: [id number] DOB: Mar 14, 1951  Height: 5\' 4"  (1.626 m) Weight: 47.8 kg   Insurance Information HMO:     PPO:      PCP:      IPA:      80/20:      OTHER:  PRIMARY: Medicare a and b      Policy#: [id number]      Subscriber: pt Benefits:  Phone #: passport one online     Name: 3/20 Eff. Date: a 10/31/2013 and b 04/02/2016     Deduct: $1556      Out of Pocket Max: none     L CIR: 100%      SNF: 20 full days Outpatient: 80%     Co-Pay: 20% Home Health: 100%      Co-Pay: none DME: 80%     Co-Pay: 20% Providers: pt choice  SECONDARY: [place] supplement      Policy#: [id number]   Financial Counselor:       Phone#:    The "Data Collection Information Summary" for patients in Inpatient Rehabilitation Facilities with attached "Privacy Act [place] Records" was provided and verbally reviewed with: Family   Emergency Contact Information         Contact Information     Name Relation Home Work Mobile    Cimmino,[name] Spouse     [place] Daughter [phone]   [phone]         Current Medical History  Patient Admitting Diagnosis: SDH   History of Present Illness: 70 year old female with past medial history of ESRD on peritoneal dialysis, GERD, CAD, seizures, CAD with reduced EF of 25 to 30%, anemia, anxiety and depression. Patient with discharge from [facility] CIR 07/24/2020 for 20 days for debility from cdiff colitis and underwent coronary stent on  06/27/2020.    Presented 07/25/2020 via EMS to [facility] after being found progressively less conscious by husband.  Reported a small fall/sat down per husband but did not hit head or have LOC. She asked to take her Ambien and melatonin around 6 or 7 pm due to fatigue and went to sleep very quickly per family. Husband unable to arouse her a couple of hours later, so EMS was called. Found to have a large left subdural hematoma causing substantial Left to right midline shift. Reversal of anticoagulation, intubation and  sent to [facility] for neurosurgical intervention.   On 3/25 went to OR for emergent hemi-crani of SDH. Started on antibiotics for hypotension, elevated lactate and leukocytosis. Found to have new onset language deficits and left facial weakness.  CT brain stable and SD drain removed  3/27 postoperatively. Some worsening aphasia on 3/27 with repeat CT scan and restarted cEEG monitoring. Switched Keppra to Brivaracetam since on PD.  On 3/28 clinical seizure manifested by right face myoclonic jerking and speech  arrest but patient alert and interactive. Given Ativan and loaded with Vimpat. No electrographic correlation with EEG . On dexamethasone  per NSU. Some lethargy noted felt due to anti seizure meds.   Nephrology following for nightly PD, Chronic hyponatremia. Na at baseline in high 120's. NSU recommends for NA >130. Resumed 3% for hyponatremia. Has had issues with hypotension, held metoprolol. Started home bicarb tablets. Started calorie count. BP low at HS so discontinued Lisinopril.   Eating so poorly  So Cortrak was placed 4/1 and then removed.   Currently holding DAPT and to resume antiplatelet asap per Cardiology. Per NSU ASA can be restarted on day 7 post NSU 4/1.        Patient's medical record from [facility] has been reviewed by the rehabilitation admission coordinator and physician.   Past Medical History      Past Medical History:  Diagnosis  Date  . Anemia      a. 08/2016 Guaiac + stool. EGD w/ gastritis.  . Anxiety    . CKD (chronic kidney disease), stage III ([place])    . Closed fracture of shaft of left ulna 11/27/2018  . Colon polyp      a. 08/2016 Colonoscopy.  . Gastritis      a. 08/2016 EGD: Gastritis-->PPI.  [name] GERD (gastroesophageal reflux disease)    . History of chicken pox    . History of measles as a child    . Hypertension    . Hyponatremia      a. 08/2016 in setting of HCTZ Rx.  . Mesenteric lymphadenopathy 04/08/2017  . Multiple thyroid nodules        Family History   family history includes Cancer in her paternal grandfather; Congestive Heart Failure in her maternal grandmother; Heart disease in her maternal uncle; Pancreatic cancer in her father.   Prior Rehab/Hospitalizations Has the patient had prior rehab or hospitalizations prior to admission? Yes  CIR 07/04/2020 until 07/24/2020 with d/c home at supervision level for safety   Has the patient had major surgery during 100 days prior to admission? Yes              Current Medications   Current Outpatient Medications:  .  aspirin 81 MG chewable tablet, Chew 1 tablet (81 mg total) by mouth daily., Disp: , Rfl:  .  atorvastatin (LIPITOR) 80 MG tablet, Take 1 tablet (80 mg total) by mouth daily at 6 PM., Disp: 30 tablet, Rfl: 0 .  atorvastatin (LIPITOR) 80 MG tablet, TAKE 1 TABLET (80 MG TOTAL) BY MOUTH DAILY AT 6 PM., Disp: 30 tablet, Rfl: 0 .  butalbital-acetaminophen-caffeine (FIORICET) 50-325-40 MG tablet, TAKE 1 TO 2 TABLETS BY MOUTH EVERY 4 HOURS AS NEEDED FOR HEADACHE (Patient taking differently: Take 2 tablets by mouth every 6 (six) hours as needed for headache.), Disp: 150 tablet, Rfl: 5 .  calcium acetate (PHOSLO) 667 MG capsule, Take 667 mg by mouth 3 (three) times daily., Disp: , Rfl:  .  clopidogrel (PLAVIX) 75 MG tablet, Take 1 tablet (75 mg total) by mouth daily., Disp: 30 tablet, Rfl: 1 .  clopidogrel (PLAVIX) 75 MG tablet, TAKE 1 TABLET (75 MG  TOTAL) BY MOUTH DAILY., Disp: 30 tablet, Rfl: 1 .  esomeprazole (NEXIUM) 20 MG capsule, Take 20 mg by mouth daily., Disp: , Rfl:  .  gentamicin cream (GARAMYCIN) 0.1 %, Apply 1 application topically daily., Disp: 30 g, Rfl: 0 .  levETIRAcetam (KEPPRA) 500 MG tablet, Take 1 tablet (500 mg total) by mouth 2 (two) times daily., Disp: 60 tablet, Rfl: 1 .  levETIRAcetam (KEPPRA) 500 MG tablet, TAKE 1  TABLET (500 MG TOTAL) BY MOUTH TWO TIMES DAILY., Disp: 60 tablet, Rfl: 1 .  melatonin 3 MG TABS tablet, Take 1 tablet (3 mg total) by mouth at bedtime., Disp: 30 tablet, Rfl: 0 .  metoCLOPramide (REGLAN) 5 MG tablet, Take 1 tablet (5 mg total) by mouth daily at 6 (six) AM., Disp: 30 tablet, Rfl: 0 .  metoprolol succinate (TOPROL-XL) 25 MG 24 hr tablet, Take 1 tablet (25 mg total) by mouth daily., Disp: 30 tablet, Rfl: 0 .  metoprolol succinate (TOPROL-XL) 25 MG 24 hr tablet, TAKE 1 TABLET (25 MG TOTAL) BY MOUTH DAILY., Disp: 30 tablet, Rfl: 0 .  pantoprazole (PROTONIX) 40 MG tablet, Take 1 tablet (40 mg total) by mouth daily., Disp: 30 tablet, Rfl: 0 .  pantoprazole (PROTONIX) 40 MG tablet, TAKE 1 TABLET (40 MG TOTAL) BY MOUTH DAILY., Disp: 30 tablet, Rfl: 1 .  prochlorperazine (COMPAZINE) 10 MG tablet, Take 1 tablet (10 mg total) by mouth every 8 (eight) hours as needed for nausea or vomiting., Disp: 30 tablet, Rfl: 0 .  saccharomyces boulardii (FLORASTOR) 250 MG capsule, Take 1 capsule (250 mg total) by mouth 2 (two) times daily., Disp: 60 capsule, Rfl: 0 .  zolpidem (AMBIEN) 10 MG tablet, Take 10 mg by mouth at bedtime as needed., Disp: , Rfl:    Patients Current Diet: Diet Renal. Poor PO intake   Precautions / Restrictions Precautions: Fall Precaution Comments: seizure precautions Weight Bearing Restrictions: No   Has the patient had 2 or more falls or a fall with injury in the past year? Yes   Prior Activity Level Limited Community (1-2x/wk): CGA, supervision at d/c on 3/24 form CIR     Prior  Functional Level Self Care: Did the patient need help bathing, dressing, using the toilet or eating? Needed some help   Indoor Mobility: Did the patient need assistance with walking from room to room (with or without device)? Needed some help   Stairs: Did the patient need assistance with internal or external stairs (with or without device)? Needed some help   Functional Cognition: Did the patient need help planning regular tasks such as shopping or remembering to take medications? Independent   Home Assistive Devices / Equipment   Prior Device Use: Indicate devices/aids used by the patient prior to current illness, exacerbation or injury? Walker     Prior Functional Level Current Functional Level  Bed Mobility    Independent  mod assist    Transfers    supervision    mod assist    Mobility - Walk/Wheelchair    ambulated with RW at supervision level except CGA negotiating threshold of bathroom. 150 feet with RW. Some LOB needing min assist to recover when felling light headed. LEs wrapped with ace wraps with patient in supine due to ortho stasis issues   Mod assist transfers to [facility] and recliner. No further gait  Upper Body Dressing    clothing management and hygiene at supervision    mod    Lower Body Dressing    supervision    max to total    Grooming    supervision    mod    Eating/Drinking    independent but poor po with chronic nausea, but patient reports nausea is new during CIR admit    poor po intake. Cortrak placed short term and then removed    Toilet Transfer    supervision    mod     Bladder Continence  continent      Bowel Management    continent      Stair Climbing   supervision   not attempted    Communication    independent    new speech deficits    Memory    capable of making own decisions      Special Needs/ Care Considerations Dialysis: Peritoneal [place] with [name] at home nightly   Previous Home Environment  Living  Arrangements: Spouse/significant other  Lives With: Spouse Available Help at Discharge: Family; Available 24 hours/day (spouse, daughter and hired assist) Type of Home: [place]: Two level; Able to live on main level with bedroom/bathroom Home Access: Stairs to enter Entrance Stairs-Rails: None Entrance Stairs-Number of Steps: 2 Bathroom Shower/Tub: Multimedia programmer: Standard Bathroom Accessibility: Yes How Accessible: Accessible via walker Media: Yes   Discharge Living Setting Plans for Discharge Living Setting: Patient's home; Lives with (comment) (spouse) Type of Home at Discharge: House Discharge Home Layout: Two level; Able to live on main level with bedroom/bathroom Discharge Home Access: Stairs to enter Entrance Stairs-Rails: None Entrance Stairs-Number of Steps: 2 Discharge Bathroom Shower/Tub: Walk-in shower Discharge Bathroom Toilet: Standard Discharge Bathroom Accessibility: Yes How Accessible: Accessible via walker Does the patient have any problems obtaining your medications?: No   Social/Family/Support Systems Patient Roles: Spouse; Parent Contact Information: spouse, Legrand [place] Anticipated Caregiver: Husband and daughter Anticipated Ambulance person Information: see above Ability/Limitations of Caregiver: Husband works in Hotel manager 7:30-5 pm and daughter has her own Product manager Caregiver Availability: 24/7 Discharge Plan Discussed with Primary Caregiver: Yes Is Caregiver In Agreement with Plan?: Yes Does Caregiver/Family have Issues with Lodging/Transportation while Pt is in Rehab?: No   Goals Patient/Family Goal for Rehab: supervision to min assist with PT, OT and SLP Expected length of stay: ELOS 2 weeks Pt/Family Agrees to Admission and willing to participate: Yes Program Orientation Provided & Reviewed with Pt/Caregiver Including Roles  & Responsibilities: Yes   Decrease burden of Care through IP rehab admission:  n/a   Possible need for SNF placement upon discharge: not anticipated. Would have to switch to hemodialysis to be placed in SNF   Patient Condition: I have reviewed medical records from [facility], spoken with CM, and spouse. I discussed via phone for inpatient rehabilitation assessment.  Patient will benefit from ongoing PT, OT and SLP, can actively participate in 3 hours of therapy a day 5 days of the week, and can make measurable gains during the admission.  Patient will also benefit from the coordinated team approach during an Inpatient Acute Rehabilitation admission.  The patient will receive intensive therapy as well as Rehabilitation physician, nursing, social worker, and care management interventions.  Due to bladder management, bowel management, safety, skin/wound care, disease management, medication administration, pain management and patient education the patient requires 24 hour a day rehabilitation nursing.  The patient is currently mod assist overall with mobility and basic ADLs.  Discharge setting and therapy post discharge at home with home health is anticipated.  Patient has agreed to participate in the Acute Inpatient Rehabilitation Program and will admit today.   Preadmission Screen Completed By:  Cleatrice Burke, 08/04/2020 9:43 AM ______________________________________________________________________   Discussed status with Dr. Naaman Plummer on  08/04/2020 at 58 and received approval for admission today.   Admission Coordinator:  Cleatrice Burke, RN, time 1610 Date 08/04/2020    Assessment/Plan: 1. Diagnosis: Left SDH 2. Does the need for close, 24 hr/day Medical supervision in  concert with the patient's rehab needs make it unreasonable for this patient to be served in a less intensive setting? Yes 3. Co-Morbidities requiring supervision/potential complications: ESRD/PD, CAD, recent c diff colitis, seizures 4. Due to bladder management, bowel management,  safety, skin/wound care, disease management, medication administration, pain management and patient education, does the patient require 24 hr/day rehab nursing? Yes 5. Does the patient require coordinated care of a physician, rehab nurse, PT, OT, and SLP to address physical and functional deficits in the context of the above medical diagnosis(es)? Yes Addressing deficits in the following areas: balance, endurance, locomotion, strength, transferring, bowel/bladder control, bathing, dressing, feeding, grooming, toileting, cognition, speech, language, swallowing and psychosocial support 6. Can the patient actively participate in an intensive therapy program of at least 3 hrs of therapy 5 days a week? Yes 7. The potential for patient to make measurable gains while on inpatient rehab is excellent 8. Anticipated functional outcomes upon discharge from inpatient rehab: supervision and min assist PT, supervision and min assist OT, supervision and min assist SLP 9. Estimated rehab length of stay to reach the above functional goals is: 14 days 10. Anticipated discharge destination: Home 10. Overall Rehab/Functional Prognosis: excellent   MD Signature Meredith Staggers, MD, [place] 08/04/2020         Cosigned by: Meredith Staggers, MD at 08/04/2020  1:14 PM    Revision History                                       Note Details  Author Cristina Gong, RN File Time 08/04/2020 10:41 AM  Author Type Rehab Admission Coordinator Status Signed  Last Editor Meredith Staggers, MD Service Physical Medicine and [place] # [phone] Admit Date 08/04/2020

## 2020-08-04 NOTE — Progress Notes (Incomplete)
Patient admitted to Rehab from [place] approximately at 1345 via stretcher with medic. Report received from nurse earlier this AM. Patient alert and oriented x4. PERRLA. Lung sounds present x4, diminished to bases. Pulses +2 to all 4 extremities. Bowel sounds normal and active x4; LBM 08/03/20 per report. Continent of B/B, with use of bedpan at this time. Generalized weakness noted to BLE and BUE. Scattered

## 2020-08-05 DIAGNOSIS — S065X3D Traumatic subdural hemorrhage with loss of consciousness of 1 hour to 5 hours 59 minutes, subsequent encounter: Secondary | ICD-10-CM

## 2020-08-05 DIAGNOSIS — D649 Anemia, unspecified: Secondary | ICD-10-CM

## 2020-08-05 DIAGNOSIS — K3184 Gastroparesis: Secondary | ICD-10-CM | POA: Diagnosis not present

## 2020-08-05 DIAGNOSIS — E8809 Other disorders of plasma-protein metabolism, not elsewhere classified: Secondary | ICD-10-CM | POA: Diagnosis not present

## 2020-08-05 DIAGNOSIS — E871 Hypo-osmolality and hyponatremia: Secondary | ICD-10-CM | POA: Diagnosis not present

## 2020-08-05 DIAGNOSIS — R569 Unspecified convulsions: Secondary | ICD-10-CM

## 2020-08-05 DIAGNOSIS — E46 Unspecified protein-calorie malnutrition: Secondary | ICD-10-CM

## 2020-08-05 LAB — COMPREHENSIVE METABOLIC PANEL
ALT: 23 U/L (ref 0–44)
AST: 28 U/L (ref 15–41)
Albumin: 1.1 g/dL — ABNORMAL LOW (ref 3.5–5.0)
Alkaline Phosphatase: 122 U/L (ref 38–126)
Anion gap: 10 (ref 5–15)
BUN: 26 mg/dL — ABNORMAL HIGH (ref 8–23)
CO2: 23 mmol/L (ref 22–32)
Calcium: 8.1 mg/dL — ABNORMAL LOW (ref 8.9–10.3)
Chloride: 97 mmol/L — ABNORMAL LOW (ref 98–111)
Creatinine, Ser: 2.85 mg/dL — ABNORMAL HIGH (ref 0.44–1.00)
GFR, Estimated: 17 mL/min — ABNORMAL LOW (ref >60)
Glucose, Bld: 102 mg/dL — ABNORMAL HIGH (ref 70–99)
Potassium: 4 mmol/L (ref 3.5–5.1)
Sodium: 130 mmol/L — ABNORMAL LOW (ref 135–145)
Total Bilirubin: 0.4 mg/dL (ref 0.3–1.2)
Total Protein: 3.7 g/dL — ABNORMAL LOW (ref 6.5–8.1)

## 2020-08-05 MED ORDER — DELFLEX-LC/2.5% DEXTROSE 394 MOSM/L IP SOLN: INTRAPERITONEAL | Status: DC

## 2020-08-05 MED ORDER — DELFLEX-LC/1.5% DEXTROSE 344 MOSM/L IP SOLN: INTRAPERITONEAL | Status: DC

## 2020-08-05 MED ORDER — PROSOURCE PLUS PO LIQD
30.0000 mL | Freq: Two times a day (BID) | ORAL | Status: DC
  Administered 2020-08-05 – 2020-08-06 (×4): 30 mL via ORAL
  Filled 2020-08-05 (×4): qty 30

## 2020-08-05 MED ORDER — HEPARIN 1000 UNIT/ML FOR PERITONEAL DIALYSIS
INTRAPERITONEAL | Status: DC | PRN
  Filled 2020-08-05: qty 5000

## 2020-08-05 NOTE — Telephone Encounter (Signed)
Attempted to schedule no ans no vm  

## 2020-08-05 NOTE — Evaluation (Signed)
Physical Therapy Assessment and Plan  Patient Details  Name: Amy [name] MRN: [id number] Date of Birth: 05-Nov-1950  PT Diagnosis: Abnormality of gait, Hemiparesis dominant and Muscle weakness Rehab Potential: Good ELOS: 4 weeks   Today's Date: 08/05/2020 PT Individual Time: 1400-1500 PT Individual Time Calculation (min): 60 min    Hospital Problem: Principal Problem:   Subdural hemorrhage following injury ([place]) Active Problems:   Acute on chronic anemia   Past Medical History:  Past Medical History:  Diagnosis Date  . Anemia    a. 08/2016 Guaiac + stool. EGD w/ gastritis.  . Anxiety   . Closed fracture of shaft of left ulna 11/27/2018  . Colon polyp    a. 08/2016 Colonoscopy.  . ESRD on peritoneal dialysis ([place])   . Gastritis    a. 08/2016 EGD: Gastritis-->PPI.  [name] GERD (gastroesophageal reflux disease)   . History of chicken pox   . History of measles as a child   . Hypertension   . Hyponatremia    a. 08/2016 in setting of HCTZ Rx.  . Mesenteric lymphadenopathy 04/08/2017  . Multiple thyroid nodules    Past Surgical History:  Past Surgical History:  Procedure Laterality Date  . ABDOMINAL HYSTERECTOMY  2003   due to heavy periods and clotting during menses  . BREAST LUMPECTOMY Left 1990  . CHOLECYSTECTOMY  2010  . COLONOSCOPY WITH PROPOFOL N/A 09/07/2016   Procedure: COLONOSCOPY WITH PROPOFOL;  Surgeon: Lucilla Lame, MD;  Location: [facility] ENDOSCOPY;  Service: Endoscopy;  Laterality: N/A;  . CORONARY STENT INTERVENTION N/A 06/27/2020   Procedure: CORONARY STENT INTERVENTION;  Surgeon: Sherren Mocha, MD;  Location: [place] [facility] LAB;  Service: Cardiovascular;  Laterality: N/A;  . DIALYSIS/PERMA CATHETER INSERTION N/A 11/16/2018   Procedure: DIALYSIS/PERMA CATHETER INSERTION;  Surgeon: Algernon Huxley, MD;  Location: [place] [facility] LAB;  Service: Cardiovascular;  Laterality: N/A;  . DIALYSIS/PERMA CATHETER REMOVAL N/A 08/16/2019   Procedure: DIALYSIS/PERMA CATHETER REMOVAL;   Surgeon: Algernon Huxley, MD;  Location: [place] [facility] LAB;  Service: Cardiovascular;  Laterality: N/A;  . DILATION AND CURETTAGE OF UTERUS  1985  . ESOPHAGOGASTRODUODENOSCOPY (EGD) WITH PROPOFOL N/A 09/07/2016   Procedure: ESOPHAGOGASTRODUODENOSCOPY (EGD) WITH PROPOFOL;  Surgeon: Lucilla Lame, MD;  Location: [facility] ENDOSCOPY;  Service: Endoscopy;  Laterality: N/A;  . GALLBLADDER SURGERY  2012  . INTRAMEDULLARY (IM) NAIL INTERTROCHANTERIC Right 04/23/2020   Procedure: INTRAMEDULLARY (IM) NAIL INTERTROCHANTRIC;  Surgeon: Thornton Park, MD;  Location: [facility] ORS;  Service: Orthopedics;  Laterality: Right;  . INTRAVASCULAR IMAGING/OCT N/A 06/27/2020   Procedure: INTRAVASCULAR IMAGING/OCT;  Surgeon: Sherren Mocha, MD;  Location: [place] [facility] LAB;  Service: Cardiovascular;  Laterality: N/A;  . KNEE SURGERY     Dr. Tamala Julian  . LEFT HEART CATH AND CORONARY ANGIOGRAPHY N/A 06/25/2020   Procedure: LEFT HEART CATH AND CORONARY ANGIOGRAPHY;  Surgeon: Troy Sine, MD;  Location: [place] [facility] LAB;  Service: Cardiovascular;  Laterality: N/A;  . TONSILLECTOMY    . TONSILLECTOMY AND ADENOIDECTOMY  1970  . UPPER GI ENDOSCOPY  03/02/2006   H. Pylori negative; Dr. Allen Norris    Assessment & Plan Clinical Impression: Patient is a 70 y.o. year old female with history of ESRD-on PD, chronic hyponatremia, chronic daily HA, multiple admissions most recent significant for C diff colitis, new onset seizures,NSTEMI s/p PCI w/DAPT, chronic issues with N/V resulting in debility andCIR stay 03/04-03/24/22 . She was discharged to home and reports of mild fall that afternoon followed byobtundationthat evening. She was evaluated  at [facility] 07/24/20 andfound to have acute on chronic Left SDH with near complete effacement of left lateral ventricle and 10 mm midline shift. She was transferred to [facility] and underwent left partial craniotomy for evacuation of SDH with placement of subdural drain which was removed on 03/28. She had  worsening of aphasia with 2 subclinical seizures therefore keppra was switched to brivaracetam on 03/27 and she was placed on LTM-EEG. She had partial motor seizure with facial jerking and difficulty speaking on 03/28 and Vimpat added with resolution of seizures-->dose was decreased to 75 mg bid on 03/31 due to sedative SE.   2D echo done 03/26 revealing EF>55% with hypercontractile LV and no masses. She developed LUE edema and was found to have superficial thrombus involving basilic vein. BLE dopplers 04/01 were negative for DVT. PD ongoing with chronic hyponateremia-Na-130 as well as ongoing issues with hypotension. Fibrin strands reported in PD and addition of heparin was cleared by NS. Sepsis work up initiated due to hypotension with leucocytosis and she was treated with Vanc X 4 days/Aztreonam X 5 days due to concerns of UTI.  Patient transferred to CIR on 08/04/2020 .    Patient currently requires total with mobility secondary to muscle weakness, decreased coordination and decreased motor planning and decreased attention, decreased awareness and decreased memory.  Prior to hospitalization, patient was min with mobility and lived with Spouse in a House home.  Home access is 2Stairs to enter.  Patient will benefit from skilled PT intervention to maximize safe functional mobility, minimize fall risk and decrease caregiver burden for planned discharge home with 24 hour assist.  Anticipate patient will benefit from follow up [facility] at discharge.  PT - End of Session Activity Tolerance: Tolerates 30+ min activity with multiple rests Endurance Deficit: Yes Endurance Deficit Description: fatigue limits basic task completion PT Assessment Rehab Potential (ACUTE/IP ONLY): Good PT Barriers to Discharge: Other (comments) PT Barriers to Discharge Comments: previously on peritoneal dialysis PT Patient demonstrates impairments in the following area(s): Balance;Edema;Endurance;Motor;Safety PT Transfers  Functional Problem(s): Bed Mobility;Bed to Chair;Car;Furniture PT Locomotion Functional Problem(s): Ambulation;Wheelchair Mobility;Stairs PT Plan PT Intensity: Minimum of 1-2 x/day ,45 to 90 minutes PT Frequency: 5 out of 7 days PT Duration Estimated Length of Stay: 4 weeks PT Treatment/Interventions: Ambulation/gait training;Balance/vestibular training;Cognitive remediation/compensation;Functional mobility training;Patient/family education;Therapeutic Exercise;UE/LE Coordination activities;Splinting/orthotics;Therapeutic Activities;Skin care/wound management;Pain management;Discharge planning;DME/adaptive equipment instruction;Neuromuscular re-education;Stair training;UE/LE Strength taining/ROM;Wheelchair propulsion/positioning PT Transfers Anticipated Outcome(s): supervision for transfers PT Locomotion Anticipated Outcome(s): CGA household ambulation PT Recommendation Follow Up Recommendations: Home health PT Patient destination: Home Equipment Recommended: To be determined   PT Evaluation Precautions/Restrictions Precautions Precautions: Fall Precaution Comments: seizure precautions Restrictions Weight Bearing Restrictions: No Other Position/Activity Restrictions: head incision General   Vital SignsTherapy Vitals Temp: 97.9 F (36.6 C) Temp Source: Oral Pulse Rate: 79 Resp: 18 BP: 120/68 Patient Position (if appropriate): Lying Oxygen Therapy SpO2: 100 % O2 Device: Room Air Pain Pain Assessment Pain Scale: 0-10 Pain Score: 5  Pain Type: Chronic pain Pain Location: Head Pain Descriptors / Indicators: Aching Pain Onset: On-going Pain Intervention(s): Repositioned Home Living/Prior Functioning Home Living Living Arrangements: Spouse/significant other Available Help at Discharge: Family;Available 24 hours/day Type of Home: House Home Access: Stairs to enter [employer] of Steps: 2 Entrance Stairs-Rails: None Home Layout: Two level;Able to live on main  level with bedroom/bathroom Bathroom Shower/Tub: Multimedia programmer: Standard Bathroom Accessibility: Yes  Lives With: Spouse Prior Function Level of Independence: Needs assistance with ADLs;Requires assistive device for independence (was d/c  home 3/24 at CGA/S level) Driving: No Vocation: Retired Vision/Perception  Vision - Risk analyst: Within Passenger transport manager Range of Motion: Within Functional Limits Alignment/Gaze Preference: Within Defined Limits Tracking/Visual Pursuits: Able to track stimulus in all quads without difficulty Saccades: Decreased speed of saccadic movement Convergence: Within functional limits Perception Perception: Within Functional Limits Praxis Praxis: Intact  Cognition Overall Cognitive Status: Impaired/Different from baseline Arousal/Alertness: Awake/alert Orientation Level: Oriented X4 Attention: Sustained Memory: Impaired Memory Impairment: Decreased recall of new information;Storage deficit;Retrieval deficit Immediate Memory Recall: Sock;Blue;Bed Memory Recall Sock: Without Cue Memory Recall Blue: Without Cue Memory Recall Bed: Without Cue Problem Solving: Impaired Safety/Judgment: Appears intact Sensation Sensation Light Touch: Appears Intact Coordination Gross Motor Movements are Fluid and Coordinated: No Fine Motor Movements are Fluid and Coordinated: No Finger Nose Finger Test: mild dysmetria bilateral Motor  Motor Motor: Other (comment) (generalized weakness) Motor - Skilled Clinical Observations: generalized weakness t/o R>L, edema noted all 4 limbs   Trunk/Postural Assessment  Cervical Assessment Cervical Assessment: Exceptions to [facility] (forward head) Thoracic Assessment Thoracic Assessment: Exceptions to [facility] (rounded shoulders and upper back) Lumbar Assessment Lumbar Assessment: Exceptions to [facility] (flattened lordosis with posterior pelvic tilt) Postural Control Postural Control: Deficits on  evaluation (weakness, limited rection to posterior LOB in sitting) Righting Reactions: LOB posterior sitting EOB with limited reaction to correct  Balance Balance Balance Assessed: Yes Static Sitting Balance Static Sitting - Level of Assistance: 4: Min assist Static Sitting - Comment/# of Minutes: able to sit few seconds with S, but LOB posterior when unsupported Dynamic Sitting Balance Dynamic Sitting - Level of Assistance: Not tested (comment) Static Standing Balance Static Standing - Balance Support: Bilateral upper extremity supported Static Standing - Level of Assistance: 3: Mod assist Static Standing - Comment/# of Minutes: UE support in parallel bars and mod A for keeping weight forward Dynamic Standing Balance Dynamic Standing - Level of Assistance: Not tested (comment) Dynamic Standing - Comments: limited testing due to weakness, fatigue and safety Extremity Assessment  RUE Assessment Passive Range of Motion (PROM) Comments: WFL Active Range of Motion (AROM) Comments: shoulder 3/4, distal full General Strength Comments: shoulder 2/5, distal 4-/5 LUE Assessment Active Range of Motion (AROM) Comments: 3/4 to full proximal, distal WFL General Strength Comments: 3/5 proximal, 4/5 distal RLE Assessment RLE Assessment: Exceptions to [facility] Active Range of Motion (AROM) Comments: grossly WFL, but noted edema in knees and ace wraps on lower legs for BP management General Strength Comments: Hip flexion 3-/5, extension 3+/5, ankle DF 3-/5 LLE Assessment LLE Assessment: Exceptions to [facility] Active Range of Motion (AROM) Comments: grossly WFL, but edema in knees and with ace wraps on lower legs for BP management General Strength Comments: Hip flexion 3+/5, knee extension 4/5, ankle DF 4-5  Care Tool Care Tool Bed Mobility Roll left and right activity   Roll left and right assist level: Moderate Assistance - Patient 50 - 74%    Sit to lying activity   Sit to lying assist level: Moderate  Assistance - Patient 50 - 74%    Lying to sitting edge of bed activity   Lying to sitting edge of bed assist level: Maximal Assistance - Patient 25 - 49%     Care Tool Transfers Sit to stand transfer   Sit to stand assist level: 2 Helpers Sit to stand assistive device: Other (parallel bars)  Chair/bed transfer   Chair/bed transfer assist level: Maximal Assistance - Patient 25 - 49% Chair/bed transfer assistive device: Armrests;Bedrails   Toilet  transfer Toilet transfer activity did not occur: Safety/medical concerns      Scientist, product/process development transfer activity did not occur: Safety/medical concerns        Care Tool Locomotion Ambulation Ambulation activity did not occur: Safety/medical concerns        Walk 10 feet activity Walk 10 feet activity did not occur: Safety/medical concerns       Walk 50 feet with 2 turns activity Walk 50 feet with 2 turns activity did not occur: Safety/medical concerns      Walk 150 feet activity Walk 150 feet activity did not occur: Safety/medical concerns      Walk 10 feet on uneven surfaces activity Walk 10 feet on uneven surfaces activity did not occur: Safety/medical concerns      Stairs Stair activity did not occur: Safety/medical concerns        Walk up/down 1 step activity Walk up/down 1 step or curb (drop down) activity did not occur: Safety/medical concerns     Walk up/down 4 steps activity did not occuR: Safety/medical concerns  Walk up/down 4 steps activity      Walk up/down 12 steps activity Walk up/down 12 steps activity did not occur: Safety/medical concerns      Pick up small objects from floor Pick up small object from the floor (from standing position) activity did not occur: Safety/medical concerns      Wheelchair Will patient use wheelchair at discharge?: Yes Type of Wheelchair: Manual   Wheelchair assist level: Minimal Assistance - Patient > 75% Max wheelchair distance: 30'  Wheel 50 feet with 2 turns activity  Wheelchair 50 feet with 2 turns activity did not occur: Safety/medical concerns    Wheel 150 feet activity Wheelchair 150 feet activity did not occur: Safety/medical concerns      Refer to Care Plan for Long Term Goals  SHORT TERM GOAL WEEK 1 PT Short Term Goal 1 (Week 1): Patient to perform bed mobility with mod A. PT Short Term Goal 2 (Week 1): Patient to perform sit to stand with mod A. PT Short Term Goal 3 (Week 1): Bed<>chair transfers with mod A. PT Short Term Goal 4 (Week 1): Patient to initiate ambulation. Recommendations for other services: Neuropsych  Skilled Therapeutic Intervention Patient in supine on bedpan and reports needs to sit a little longer.  Discussed events leading to readmission.  Patient rolled to R with mod A and cues for rail with total A for hygiene, bed pan removal, donning brief and pulling up pants.  Completed BP measurements supine and seated, but not able to perform in standing.  Patient supine to sit with max A for LE's and trunk.  Seated EOB for BP measuring and pt with LOB posterior when not given min a for balance, mod assist to recover.  Squat pivot to w/c max A.  Patient in w/c able to maneuver with min A about 30' with help for R LE reaching higher on wheel and for keeping w/c straight.  Patient sit to stand x 2 in parallel bars with +2 A x 1 and max A x 1.  Patient standing posterior bias mod cues and mod A for bringing COG over BOS.  Tolerated about 30 seconds in standing.  Patient in w/c assisted to room and stand pivot after demonstration to bed with mod to max A .  Sit to supine mod A for LE's.  Left in supine with bed alarm on, all needs in reach.  Mobility Bed Mobility Bed Mobility:  Rolling Right;Rolling Left;Right Sidelying to Sit;Supine to Sit;Sit to Supine Rolling Right: Moderate Assistance - Patient 50-74% Rolling Left: Moderate Assistance - Patient 50-74% Right Sidelying to Sit: Maximal Assistance - Patient 25-49% Sit to Supine: Moderate  Assistance - Patient 50-74% Transfers Transfers: Sit to [employer] Transfers Sit to Stand: 2 Helpers Squat Pivot Transfers: Maximal Assistance - Patient 25-49% Transfer (Assistive device):  (parallel bars) Locomotion  Gait Ambulation: No Wheelchair Mobility Wheelchair Mobility: Yes Wheelchair Assistance: Minimal assistance - Patient >75% Wheelchair Propulsion: Both upper extremities Wheelchair Parts Management: Needs assistance Distance: 30'   Discharge Criteria: Patient will be discharged from PT if patient refuses treatment 3 consecutive times without medical reason, if treatment goals not met, if there is a change in medical status, if patient makes no progress towards goals or if patient is discharged from hospital.  The above assessment, treatment plan, treatment alternatives and goals were discussed and mutually agreed upon: by patient  Jamison Oka, PT 08/05/2020, 5:45 PM

## 2020-08-05 NOTE — Progress Notes (Signed)
Inpatient [facility] Individual Statement of Services  Patient Name:  Amy [name]  Date:  08/05/2020  Welcome to the [facility].  Our goal is to provide you with an individualized program based on your diagnosis and situation, designed to meet your specific needs.  With this comprehensive rehabilitation program, you will be expected to participate in at least 3 hours of rehabilitation therapies Monday-Friday, with modified therapy programming on the weekends.  Your rehabilitation program will include the following services:  Physical Therapy (PT), Occupational Therapy (OT), Speech Therapy (ST), 24 hour per day rehabilitation nursing, Neuropsychology, Care Coordinator, Rehabilitation Medicine, Nutrition Services and Pharmacy Services  Weekly team conferences will be held on Wednesday to discuss your progress.  Your Inpatient Rehabilitation Care Coordinator will talk with you frequently to get your input and to update you on team discussions.  Team conferences with you and your family in attendance may also be held.  Expected length of stay: 3-4 weeks  Overall anticipated outcome: CGA 24/7   Depending on your progress and recovery, your program may change. Your Inpatient Rehabilitation Care Coordinator will coordinate services and will keep you informed of any changes. Your Inpatient Rehabilitation Care Coordinator's name and contact numbers are listed  below.  The following services may also be recommended but are not provided by the [place]:    [place] will be made to provide these services after discharge if needed.  Arrangements include referral to agencies that provide these services.  Your insurance has been verified to be:  Medicare & Commerical Your primary doctor is:  Lelon Huh  Pertinent information will be shared with your doctor and your  insurance company.  Inpatient Rehabilitation Care Coordinator:  Ovidio Kin, [place] or Emilia Beck  Information discussed with and copy given to patient by: [name], 08/05/2020, 3:21 PM

## 2020-08-05 NOTE — Evaluation (Signed)
Occupational Therapy Assessment and Plan  Patient Details  Name: Amy [name] MRN: [id number] Date of Birth: 1950-05-20  OT Diagnosis: hemiplegia affecting dominant side and muscle weakness (generalized) Rehab Potential:   ELOS: 4 weeks   Today's Date: 08/05/2020 OT Individual Time: 3893-7342 OT Individual Time Calculation (min): 69 min     Hospital Problem: Principal Problem:   Subdural hemorrhage following injury ([place]) Active Problems:   Acute on chronic anemia   Past Medical History:  Past Medical History:  Diagnosis Date  . Anemia    a. 08/2016 Guaiac + stool. EGD w/ gastritis.  . Anxiety   . Closed fracture of shaft of left ulna 11/27/2018  . Colon polyp    a. 08/2016 Colonoscopy.  . ESRD on peritoneal dialysis ([place])   . Gastritis    a. 08/2016 EGD: Gastritis-->PPI.  [name] GERD (gastroesophageal reflux disease)   . History of chicken pox   . History of measles as a child   . Hypertension   . Hyponatremia    a. 08/2016 in setting of HCTZ Rx.  . Mesenteric lymphadenopathy 04/08/2017  . Multiple thyroid nodules    Past Surgical History:  Past Surgical History:  Procedure Laterality Date  . ABDOMINAL HYSTERECTOMY  2003   due to heavy periods and clotting during menses  . BREAST LUMPECTOMY Left 1990  . CHOLECYSTECTOMY  2010  . COLONOSCOPY WITH PROPOFOL N/A 09/07/2016   Procedure: COLONOSCOPY WITH PROPOFOL;  Surgeon: Lucilla Lame, MD;  Location: [facility] ENDOSCOPY;  Service: Endoscopy;  Laterality: N/A;  . CORONARY STENT INTERVENTION N/A 06/27/2020   Procedure: CORONARY STENT INTERVENTION;  Surgeon: Sherren Mocha, MD;  Location: [place] CV LAB;  Service: Cardiovascular;  Laterality: N/A;  . DIALYSIS/PERMA CATHETER INSERTION N/A 11/16/2018   Procedure: DIALYSIS/PERMA CATHETER INSERTION;  Surgeon: Algernon Huxley, MD;  Location: [place] CV LAB;  Service: Cardiovascular;  Laterality: N/A;  . DIALYSIS/PERMA CATHETER REMOVAL N/A 08/16/2019   Procedure: DIALYSIS/PERMA CATHETER  REMOVAL;  Surgeon: Algernon Huxley, MD;  Location: [place] CV LAB;  Service: Cardiovascular;  Laterality: N/A;  . DILATION AND CURETTAGE OF UTERUS  1985  . ESOPHAGOGASTRODUODENOSCOPY (EGD) WITH PROPOFOL N/A 09/07/2016   Procedure: ESOPHAGOGASTRODUODENOSCOPY (EGD) WITH PROPOFOL;  Surgeon: Lucilla Lame, MD;  Location: [facility] ENDOSCOPY;  Service: Endoscopy;  Laterality: N/A;  . GALLBLADDER SURGERY  2012  . INTRAMEDULLARY (IM) NAIL INTERTROCHANTERIC Right 04/23/2020   Procedure: INTRAMEDULLARY (IM) NAIL INTERTROCHANTRIC;  Surgeon: Thornton Park, MD;  Location: [facility] ORS;  Service: Orthopedics;  Laterality: Right;  . INTRAVASCULAR IMAGING/OCT N/A 06/27/2020   Procedure: INTRAVASCULAR IMAGING/OCT;  Surgeon: Sherren Mocha, MD;  Location: [place] CV LAB;  Service: Cardiovascular;  Laterality: N/A;  . KNEE SURGERY     Dr. Tamala Julian  . LEFT HEART CATH AND CORONARY ANGIOGRAPHY N/A 06/25/2020   Procedure: LEFT HEART CATH AND CORONARY ANGIOGRAPHY;  Surgeon: Troy Sine, MD;  Location: [place] CV LAB;  Service: Cardiovascular;  Laterality: N/A;  . TONSILLECTOMY    . TONSILLECTOMY AND ADENOIDECTOMY  1970  . UPPER GI ENDOSCOPY  03/02/2006   H. Pylori negative; Dr. Allen Norris    Assessment & Plan Clinical Impression: Patient is a 70 y.o. year old female with history of ESRD-on PD, chronic hyponatremia, chronic daily HA, multiple admissions most recent significant for C diff colitis, new onset seizures, NSTEMI s/p PCI w/DAPT, chronic issues with N/V resulting in debility and CIR stay 03/04-03/24/22 . She was discharged to home and reports of mild fall that afternoon followed by  obtundation  that evening. She was evaluated at [facility] 07/24/20 and  found to have acute on chronic Left SDH with near complete effacement of left lateral ventricle and 10 mm midline shift. She was transferred to [facility] and underwent left partial craniotomy for evacuation of SDH with placement of subdural drain which was removed on 03/28.    She had worsening of aphasia with 2 subclinical seizures therefore keppra was switched to brivaracetam on 03/27 and she was placed on LTM-EEG. She had partial motor seizure with facial jerking and difficulty speaking on 03/28 and Vimpat added with resolution of seizures-->dose was decreased to 75 mg bid on 03/31 due to sedative SE.    2D echo done 03/26 revealing EF>55% with hypercontractile LV and no masses.  She developed LUE edema and was found to have superficial thrombus involving basilic vein.  BLE dopplers 04/01 were negative for DVT. PD ongoing with chronic hyponateremia-Na-130 as well as ongoing issues with hypotension. Fibrin strands reported in PD and addition of heparin was cleared by NS.  Sepsis work up initiated due to hypotension with leucocytosis and she was treated with Vanc X 4 days/Aztreonam X 5 days due to concerns of UTI.  Patient transferred to CIR on 08/04/2020 .    Patient currently requires max with basic self-care skills and IADL secondary to muscle weakness, decreased cardiorespiratoy endurance, decreased coordination and decreased motor planning, decreased visual perceptual skills, decreased problem solving and delayed processing and decreased sitting balance, decreased standing balance, decreased postural control and hemiplegia.  Prior to hospitalization, patient could complete ADL with supervision.  Patient will benefit from skilled intervention to decrease level of assist with basic self-care skills and increase independence with basic self-care skills prior to discharge home with care partner.  Anticipate patient will require 24 hour supervision and minimal physical assistance and follow up home health.  OT - End of Session Activity Tolerance: Tolerates 10 - 20 min activity with multiple rests Endurance Deficit: Yes Endurance Deficit Description: fatigue limits basic task completion OT Assessment OT Patient demonstrates impairments in the following area(s):  Balance;Edema;Endurance;Motor;Pain;Skin Integrity;Vision OT Basic ADL's Functional Problem(s): Eating;Grooming;Bathing;Dressing;Toileting OT Transfers Functional Problem(s): Toilet OT Additional Impairment(s): Fuctional Use of Upper Extremity OT Plan OT Intensity: Minimum of 1-2 x/day, 45 to 90 minutes OT Frequency: 5 out of 7 days OT Duration/Estimated Length of Stay: 4 weeks OT Treatment/Interventions: Balance/vestibular training;Neuromuscular re-education;Self Care/advanced ADL retraining;Wheelchair propulsion/positioning;Cognitive remediation/compensation;DME/adaptive equipment instruction;Pain management;Skin care/wound managment;Patient/family education;UE/LE Coordination activities;Discharge planning;Functional mobility training;Therapeutic Activities;Visual/perceptual remediation/compensation OT Self Feeding Anticipated Outcome(s): mod I OT Basic Self-Care Anticipated Outcome(s): CG/min A OT Toileting Anticipated Outcome(s): min A OT Bathroom Transfers Anticipated Outcome(s): CG/min A OT Recommendation Patient destination: Home Follow Up Recommendations: Home health OT Equipment Details: Pt already owns 3in1, shower chair   OT Evaluation Precautions/Restrictions  Precautions Precautions: Fall Precaution Comments: seizure precautions Restrictions Weight Bearing Restrictions: No Other Position/Activity Restrictions: head incision General   Vital Signs Therapy Vitals Temp: 97.9 F (36.6 C) Temp Source: Oral Pulse Rate: 79 Resp: 18 BP: 120/68 Patient Position (if appropriate): Lying Oxygen Therapy SpO2: 100 % O2 Device: Room Air Pain Pain Assessment Pain Scale: 0-10 Pain Score: 5  Pain Type: Chronic pain Pain Location: Head Pain Descriptors / Indicators: Aching Pain Intervention(s): Repositioned Home Living/Prior Functioning Home Living Family/patient expects to be discharged to:: Private residence Living Arrangements: Spouse/significant other Available Help  at Discharge: Family,Available 24 hours/day Type of Home: House Home Access: Stairs to enter [employer] of Steps: 2 Entrance Stairs-Rails: None Home  Layout: Two level,Able to live on main level with bedroom/bathroom Bathroom Shower/Tub: Multimedia programmer: Standard Bathroom Accessibility: Yes  Lives With: Spouse IADL History Leisure and Hobbies: cooking, visiting with friends Prior Function Level of Independence:  (patient discharged to home on 3/24 at CG/CS level with RW) Driving: No Vocation: Retired Holiday representative Report: Other (comment) (denies diplopia or change in vision) Vision Assessment?: Yes Eye Alignment: Within Functional Limits Ocular Range of Motion: Within Functional Limits Alignment/Gaze Preference: Within Defined Limits Tracking/Visual Pursuits: Able to track stimulus in all quads without difficulty Saccades: Decreased speed of saccadic movement Convergence: Within functional limits Visual Fields: Left visual field deficit Perception  Perception: Within Functional Limits Praxis Praxis: Intact Cognition Overall Cognitive Status: Impaired/Different from baseline Arousal/Alertness: Awake/alert Orientation Level: Person;Place;Situation Person: Oriented Place: Oriented Situation: Oriented Year: 2022 Month: April Day of Week: Correct Memory: Impaired Memory Impairment: Decreased recall of new information;Storage deficit;Retrieval deficit Immediate Memory Recall: Sock;Blue;Bed Memory Recall Sock: Without Cue Memory Recall Blue: Without Cue Memory Recall Bed: Without Cue Attention: Sustained Sustained Attention: Impaired Awareness: Impaired Awareness Impairment: Emergent impairment Problem Solving: Impaired Problem Solving Impairment:  (mildly complex) Safety/Judgment: Appears intact Sensation Sensation Light Touch: Appears Intact Coordination Gross Motor Movements are Fluid and Coordinated: No Fine Motor Movements are  Fluid and Coordinated: No Finger Nose Finger Test: mild dysmetria bilateral Motor  Motor Motor - Skilled Clinical Observations: generalized weakness t/o R>L, edema noted all 4 limbs  Trunk/Postural Assessment     Balance   Extremity/Trunk Assessment RUE Assessment Passive Range of Motion (PROM) Comments: WFL Active Range of Motion (AROM) Comments: shoulder 3/4, distal full General Strength Comments: shoulder 2/5, distal 4-/5 LUE Assessment Active Range of Motion (AROM) Comments: 3/4 to full proximal, distal WFL General Strength Comments: 3/5 proximal, 4/5 distal  Care Tool Care Tool Self Care Eating   Eating Assist Level: Supervision/Verbal cueing    Oral Care    Oral Care Assist Level: Supervision/Verbal cueing    Bathing   Body parts bathed by patient: Right arm;Left arm;Chest;Abdomen;Front perineal area;Face Body parts bathed by helper: Right arm;Left arm;Front perineal area;Buttocks;Right upper leg;Left upper leg;Right lower leg;Left lower leg   Assist Level: Maximal Assistance - Patient 24 - 49%    Upper Body Dressing(including orthotics)   What is the patient wearing?: Pull over shirt   Assist Level: Maximal Assistance - Patient 25 - 49%    Lower Body Dressing (excluding footwear)   What is the patient wearing?: Pants;Incontinence brief Assist for lower body dressing: Total Assistance - Patient < 25%    Putting on/Taking off footwear   What is the patient wearing?: Non-skid slipper socks (bilateral LE ace wraps) Assist for footwear: Dependent - Patient 0%       Care Tool Toileting Toileting activity   Assist for toileting: Total Assistance - Patient < 25%     Care Tool Bed Mobility Roll left and right activity   Roll left and right assist level: Moderate Assistance - Patient 50 - 74%    Sit to lying activity        Lying to sitting edge of bed activity         Care Tool Transfers Sit to stand transfer        Chair/bed transfer         Toilet  transfer         Care Tool Cognition Expression of Ideas and Wants     Understanding Verbal and Non-Verbal Content  Memory/Recall Ability *first 3 days only      Refer to Care Plan for Long Term Goals  SHORT TERM GOAL WEEK 1 OT Short Term Goal 1 (Week 1): patient will complete LB adl bed level with mod A OT Short Term Goal 2 (Week 1): Patient will complete UB adl w/c level with min A OT Short Term Goal 3 (Week 1): Patient will complete bed mobility with mod A OT Short Term Goal 4 (Week 1): patient will complete functional sit or stand pivot transfer with mod A  Recommendations for other services: None    Skilled Therapeutic Intervention  Patient in bed, alert and able to recall recent rehab stay.  She is able to read my name tag and recall some details of our sessions together.  We reviewed role of OT and goals for therapy.  Evaluation completed as documented above.  She presents with general weakness right side greater than left but using both arms functionally.  She has edema in all 4 limbs that was not present on prior stay and bruising arms/legs, sore skin on buttocks.  HR 80, O2 100%, BP 114/65 at rest.  Completed adl training and review of techniques at bed level - generalized weakness/LB impairment limits independence.  Bilateral LEs ace wrapped.  toileting using bed pan at this time due to not yet OOB (incontinent of small bowel, continent of urine) - dependent to complete through hygiene and place clean brief.  Overall she is pleasant, cooperative and eager to participate.  She remained in bed at close of session, bed alarm set and call bell in hand.    ADL ADL Eating: Supervision/safety;Set up Where Assessed-Eating: Bed level Grooming: Minimal assistance Where Assessed-Grooming: Bed level Upper Body Bathing: Moderate assistance Where Assessed-Upper Body Bathing: Bed level Lower Body Bathing: Dependent Where Assessed-Lower Body Bathing: Bed level Upper Body Dressing:  Maximal assistance Where Assessed-Upper Body Dressing: Bed level Lower Body Dressing: Dependent Where Assessed-Lower Body Dressing: Bed level Toileting: Dependent Where Assessed-Toileting: Bed level Mobility  Bed Mobility Rolling Right: Moderate Assistance - Patient 50-74% Rolling Left: Moderate Assistance - Patient 50-74%   Discharge Criteria: Patient will be discharged from OT if patient refuses treatment 3 consecutive times without medical reason, if treatment goals not met, if there is a change in medical status, if patient makes no progress towards goals or if patient is discharged from hospital.  The above assessment, treatment plan, treatment alternatives and goals were discussed and mutually agreed upon: by patient  Carlos Levering 08/05/2020, 5:05 PM

## 2020-08-05 NOTE — Progress Notes (Signed)
Initial Nutrition Assessment  DOCUMENTATION CODES:   Not applicable  INTERVENTION:   - Magic cup TID with meals, each supplement provides 290 kcal and 9 grams of protein  - ProSource Plus 30 ml po BID, each supplement provides 100 kcal and 15 grams of protein  - Chocolate milk TID with meals  - Vanilla ice cream TID with meals (to mix with orange cream Magic Cups)  - Encourage adequate PO intake  NUTRITION DIAGNOSIS:   Inadequate oral intake related to nausea,vomiting,poor appetite as evidenced by per patient/family report.  GOAL:   Patient will meet greater than or equal to 90% of their needs  MONITOR:   PO intake,Supplement acceptance,Labs,Weight trends,I & O's  REASON FOR ASSESSMENT:   Other (history of malnutrition)    ASSESSMENT:   70 year old female with PMH of ESRD on PD, chronic hyponatremia, C diff colitis, seizures, NSTEMI s/p PCI w/ DAPT, chronic issues with N/V resulting in debility and CIR stay 3/04-3/24/22. Pt was discharged to home and experienced a mild fall that afternoon followed by obtundation that evening. Pt was evaluated at [facility] 07/24/20 and found to have acute on chronic left SDH. Pt was transferred to [facility] and underwent left partial craniotomy for evacuation of SDH with placement of subdural drain which was removed on 3/28. Cortrak placed at [facility] for nutrition support. Admitted to CIR on 4/04.   RD is familiar with this pt from previous CIR admission.  Spoke with pt briefly with RN at bedside. Pt eating lunch at time of RD visit and had just had an episode of emesis per RN. Discussed with RN that N/V was an issue for pt during previous CIR admission. Noted that MD has ordered scheduled reglan.  Pt reports appetite is okay and she will eat if it is something that she likes. Noted pt required Cortrak placement and tube feeds at [facility]. Pt reports that her tube was removed and she doesn't need her tube feeds anymore.  RD to order Magic Cups with meals as  well as chocolate milk with meals. These are supplements that pt tolerates well. Pt will not drink other oral nutrition supplements like Ensure and Nepro.  Pt with a history of malnutrition. RD was unable to complete NFPE at this time as pt required immediate use of bedpan. Will complete NFPE at follow-up.  Medications reviewed and include: ProSource Plus BID, colace BID, melatonin, reglan 5 mg q 8 hours, protonix, sodium bicarb 650 mg BID  Labs reviewed: sodium 130  NUTRITION - FOCUSED PHYSICAL EXAM:  Unable to complete at this time. Pt had just vomited and needed to use the bedpan.  Diet Order:   Diet Order            Diet regular Room service appropriate? Yes; Fluid consistency: Thin  Diet effective now                 EDUCATION NEEDS:   Education needs have been addressed  Skin:  Skin Assessment: Reviewed RN Assessment  Last BM:  08/05/20 multiple small type 6  Height:   Ht Readings from Last 1 Encounters:  08/04/20 5\' 4"  (1.626 m)    Weight:   Wt Readings from Last 1 Encounters:  08/05/20 54 kg    BMI:  Body mass index is 20.43 kg/m.  Estimated Nutritional Needs:   Kcal:  1700-1900  Protein:  85-100 grams  Fluid:  1000 ml + UOP    Gustavus Bryant, MS, RD, LDN Inpatient Clinical Dietitian Please see  AMi[facility] for contact information.

## 2020-08-05 NOTE — Progress Notes (Signed)
[place] PHYSICAL MEDICINE & REHABILITATION PROGRESS NOTE  Subjective/Complaints: Patient seen laying in bed this morning.  She states she slept very well overnight.  She is ready to begin therapies again.  ROS: Denies CP, SOB, N/V/D  Objective: Vital Signs: Blood pressure 132/75, pulse 87, temperature 99 F (37.2 C), temperature source Oral, resp. rate 18, height 5\' 4"  (1.626 m), weight 54 kg, SpO2 100 %. No results found. No results for input(s): WBC, HGB, HCT, PLT in the last 72 hours. Recent Labs    08/05/20 0720  NA 130*  K 4.0  CL 97*  CO2 23  GLUCOSE 102*  BUN 26*  CREATININE 2.85*  CALCIUM 8.1*   No intake or output data in the 24 hours ending 08/05/20 1101      Physical Exam: BP 132/75 (BP Location: Right Arm)   Pulse 87   Temp 99 F (37.2 C) (Oral)   Resp 18   Ht 5\' 4"  (1.626 m)   Wt 54 kg   SpO2 100%   BMI 20.43 kg/m  Constitutional: No distress . Vital signs reviewed. HENT: Left [place] site with staples. Eyes: EOMI. No discharge. Cardiovascular: No JVD.  RRR. Respiratory: Normal effort.  No stridor.  Bilateral clear to auscultation. GI: Non-distended.  BS +.  + PD site. Skin: Warm and dry.   Left craniotomy with staples CDI. Scattered bruising Psych: Somewhat flat.  Normal behavior. Musc: Generalized edema.  No tenderness in extremities. Neuro: Alert Motor: LUE: 4-4+/5 proximal distal RUE: 4 --4/5 proximal distal LLE: Hip flexion 2/5, knee extension 2/5, ankle dorsiflexion 3/5 RLE: Hip flexion 2/5, knee extension 2/5, ankle dorsiflexion 3/5  Assessment/Plan: 1. Functional deficits which require 3+ hours per day of interdisciplinary therapy in a comprehensive inpatient rehab setting.  Physiatrist is providing close team supervision and 24 hour management of active medical problems listed below.  Physiatrist and rehab team continue to assess barriers to discharge/monitor patient progress toward functional and medical goals   Care  Tool:  Bathing              Bathing assist       Upper Body Dressing/Undressing Upper body dressing        Upper body assist      Lower Body Dressing/Undressing Lower body dressing            Lower body assist       Toileting Toileting    Toileting assist       Transfers Chair/bed transfer  Transfers assist           Locomotion Ambulation   Ambulation assist              Walk 10 feet activity   Assist           Walk 50 feet activity   Assist           Walk 150 feet activity   Assist           Walk 10 feet on uneven surface  activity   Assist           Wheelchair     Assist               Wheelchair 50 feet with 2 turns activity    Assist            Wheelchair 150 feet activity     Assist           Medical Problem List and Plan: 1.  Right hemiparesis and aphasia secondary to large left SDH s/p craniotomy  Begin CIR evaluations 2.  Antithrombotics: -DVT/anticoagulation:  Mechanical: Sequential compression devices, below knee Bilateral lower extremities             -antiplatelet therapy: N/A due to bleed.  3. Chronic migraines/ Pain Management: Uses Fioricet 3-4 times a day on average.   Monitor with increased exertion 4. Mood: LCSW to follow for evaluation and support.              -antipsychotic agents: N/A 5. Neuropsych: This patient is not fully capable of making decisions on her own behalf. 6. Skin/Wound Care: Monitor wound for healing.             --offer supplements. Family to provide meals from home as able.  7. Fluids/Electrolytes/Nutrition: Continue PD--does not need fluid restriction.              --has had chronic issues with variable intake as well as N/V likely due to gastroparesis. 8. Chronic Hypotension:  TEDs when out of bed             --encourage intake. 9.  ESRD on PD: Nephrology to assist/manage PD.  10.  Recurrent Seizures: Has been seizure free on  Brivaracetam and Lacosamide bid.              --Lacosamide was decreased on 03/31 to avoid sedative SE.  Monitor for breakthrough seizures 11. CAD/NSTEMI s/p PCI: Treated medically.   Off DAPT due to bleed/Metoprolol being held due to hypotension.             --Continue Lipitor.   Will consider resuming metoprolol as tolerated 12. Acute on chronic anemia:   Hemoglobin 14.9 on 3/25  Continue to monitor 13. H/o Depression: On Celexa 14. Sick Euthyroid: Will need follow up as illness resolves.              --TSH- 5.80/Free T4-1.2 (03/27) 15. Gastroparesis w/chronic N/V/anorexia: Has been ongoing per records review.              --question failure of PD (husband reports no N/V with brief episode of HD in the past)  Has responded well to Reglan previously 16.  Hyponatremia             --chronic hyponatremia with Na-130-133 range  Sodium 130 on 4/5             --Sodium bicarb to manage metabolic acidosis. 17.  Severe hypoalbuminemia  Supplement initiated on 4/5   LOS: 1 days A FACE TO FACE EVALUATION WAS PERFORMED  Amy [name] Amy [name] 08/05/2020, 11:01 AM

## 2020-08-05 NOTE — Progress Notes (Signed)
Inpatient Rehabilitation Care Coordinator Assessment and Plan Patient Details  Name: Amy [name] MRN: [id number] Date of Birth: [birth year]  Today's Date: 08/05/2020  Hospital Problems: Principal Problem:   Subdural hemorrhage following injury [facility]) Active Problems:   Acute on chronic anemia  Past Medical History:  Past Medical History:  Diagnosis Date  . Anemia    a. 08/2016 Guaiac + stool. EGD w/ gastritis.  . Anxiety   . Closed fracture of shaft of left ulna 11/27/2018  . Colon polyp    a. 08/2016 Colonoscopy.  . ESRD on peritoneal dialysis ([place])   . Gastritis    a. 08/2016 EGD: Gastritis-->PPI.  [name] GERD (gastroesophageal reflux disease)   . History of chicken pox   . History of measles as a child   . Hypertension   . Hyponatremia    a. 08/2016 in setting of HCTZ Rx.  . Mesenteric lymphadenopathy 04/08/2017  . Multiple thyroid nodules    Past Surgical History:  Past Surgical History:  Procedure Laterality Date  . ABDOMINAL HYSTERECTOMY  2003   due to heavy periods and clotting during menses  . BREAST LUMPECTOMY Left 1990  . CHOLECYSTECTOMY  2010  . COLONOSCOPY WITH PROPOFOL N/A 09/07/2016   Procedure: COLONOSCOPY WITH PROPOFOL;  Surgeon: Lucilla Lame, MD;  Location: [facility] ENDOSCOPY;  Service: Endoscopy;  Laterality: N/A;  . CORONARY STENT INTERVENTION N/A 06/27/2020   Procedure: CORONARY STENT INTERVENTION;  Surgeon: Sherren Mocha, MD;  Location: [place] CV LAB;  Service: Cardiovascular;  Laterality: N/A;  . DIALYSIS/PERMA CATHETER INSERTION N/A 11/16/2018   Procedure: DIALYSIS/PERMA CATHETER INSERTION;  Surgeon: Algernon Huxley, MD;  Location: [place] CV LAB;  Service: Cardiovascular;  Laterality: N/A;  . DIALYSIS/PERMA CATHETER REMOVAL N/A 08/16/2019   Procedure: DIALYSIS/PERMA CATHETER REMOVAL;  Surgeon: Algernon Huxley, MD;  Location: [place] CV LAB;  Service: Cardiovascular;  Laterality: N/A;  . DILATION AND CURETTAGE OF UTERUS  1985  .  ESOPHAGOGASTRODUODENOSCOPY (EGD) WITH PROPOFOL N/A 09/07/2016   Procedure: ESOPHAGOGASTRODUODENOSCOPY (EGD) WITH PROPOFOL;  Surgeon: Lucilla Lame, MD;  Location: [facility] ENDOSCOPY;  Service: Endoscopy;  Laterality: N/A;  . GALLBLADDER SURGERY  2012  . INTRAMEDULLARY (IM) NAIL INTERTROCHANTERIC Right 04/23/2020   Procedure: INTRAMEDULLARY (IM) NAIL INTERTROCHANTRIC;  Surgeon: Thornton Park, MD;  Location: [facility] ORS;  Service: Orthopedics;  Laterality: Right;  . INTRAVASCULAR IMAGING/OCT N/A 06/27/2020   Procedure: INTRAVASCULAR IMAGING/OCT;  Surgeon: Sherren Mocha, MD;  Location: [place] CV LAB;  Service: Cardiovascular;  Laterality: N/A;  . KNEE SURGERY     Dr. Tamala Julian  . LEFT HEART CATH AND CORONARY ANGIOGRAPHY N/A 06/25/2020   Procedure: LEFT HEART CATH AND CORONARY ANGIOGRAPHY;  Surgeon: Troy Sine, MD;  Location: [place] CV LAB;  Service: Cardiovascular;  Laterality: N/A;  . TONSILLECTOMY    . TONSILLECTOMY AND ADENOIDECTOMY  1970  . UPPER GI ENDOSCOPY  03/02/2006   H. Pylori negative; Dr. Allen Norris   Social History:  reports that she has never smoked. She has never used smokeless tobacco. She reports current alcohol use. She reports that she does not use drugs.  Family / Support Systems Marital Status: Married Patient Roles: Spouse,Parent Spouse/Significant Other: Legrand Como [phone] Children: Jarrett Soho Allen-daughter [phone] Other Supports: Friends and hired Charity fundraiser: Husband, daughter and hired people Ability/Limitations of Caregiver: Husband works in [employer] 7:30-5 pm amnd daughter has her own Product manager Caregiver Availability: 24/7 Family Dynamics: Very close with thier only child-daughter all have pulled together during pt's health issues and will flex schedules  and do what is needed for pt. Pt wants to do well here, but voiced it is always something  Social History Preferred language: English Religion: None Cultural Background: No  issues Education: Secretary/administrator educated Read: Yes Write: Yes Employment Status: Retired Public relations account executive Issues: No issues Guardian/Conservator: None-but according to MD pt is not fully capable of making her own decisions at this time, so will look toward her husband if any decisions need to be made while here   Abuse/Neglect Abuse/Neglect Assessment Can Be Completed: Yes Physical Abuse: Denies Verbal Abuse: Denies Sexual Abuse: Denies Exploitation of patient/patient's resources: Denies Self-Neglect: Denies  Emotional Status Pt's affect, behavior and adjustment status: Pt can't belive all that has happened to her in the past six months, she feels their is a reason she is still here on this earth. She wants to do well and recover from this surgery. Recent Psychosocial Issues: multiple health issues and recent hospital and CIR stay. Psychiatric History: no history would benefit from seeing neruo-psych due to long hospitalization and multiple health issues and surgeries. Will add to neuro-psych list Substance Abuse History: No issues  Patient / Family Perceptions, Expectations & Goals Pt/Family understanding of illness & functional limitations: Pt and husband can explain her surgery and her past medical/hospital stay. Both feel it has been unreal and are still trying to process all that has happened in such a short period of time. Both talk with the MD's and feel they have a understnading of moving forward. Premorbid pt/family roles/activities: Wife, mom, retiree, PD pt, friend, etc Anticipated changes in roles/activities/participation: resume Pt/family expectations/goals: Pt states: " I hope to do well here again and get home for good this time."  Husband states: " I will help her and do whatever we need to do for her."  [employer]: Other (Comment) (PD pt thru [place]) Premorbid Home Care/DME Agencies: Other (Comment) (Acitve pt with  [facility]-PT,OT,RN) Transportation available at discharge: Family Resource referrals recommended: Neuropsychology  Discharge Planning Living Arrangements: Spouse/significant other Support Systems: Spouse/significant other,Children,Friends/neighbors,Church/faith community Type of Residence: Private residence Insurance Resources: [employer] (specify) ([place]) Financial Resources: Therapist, art Screen Referred: No Living Expenses: Own Money Management: Spouse Does the patient have any problems obtaining your medications?: No Home Management: Family-hired assistance Patient/Family Preliminary Plans: Return home with husband and hired assist, like she was to do before. Husband aware she will 24/7 physical care at home. Aware of the rehab process and team conference will work with again on discharge needs. Care Coordinator Anticipated Follow Up Needs: HH/OP  Clinical Impression Recent patient known to this worker, was home for 12 hr prior to admission for SDH and airlifted to [place]. Plan home with husband and hired assistance, husband aware will need to assist with her PD at discharge. Await therapy evaluations.  [name] 08/05/2020, 3:20 PM

## 2020-08-05 NOTE — Evaluation (Signed)
Speech Language Pathology Assessment and Plan  Patient Details  Name: Amy [name] MRN: [id number] Date of Birth: May 08, 1950  SLP Diagnosis: Cognitive Impairments  Rehab Potential: Good ELOS: 2 weeks    Today's Date: 08/05/2020 SLP Individual Time: 1000-1059 SLP Individual Time Calculation (min): 61 min   Hospital Problem: Principal Problem:   Subdural hemorrhage following injury ([place]) Active Problems:   Acute on chronic anemia  Past Medical History:  Past Medical History:  Diagnosis Date  . Anemia    a. 08/2016 Guaiac + stool. EGD w/ gastritis.  . Anxiety   . Closed fracture of shaft of left ulna 11/27/2018  . Colon polyp    a. 08/2016 Colonoscopy.  . ESRD on peritoneal dialysis ([place])   . Gastritis    a. 08/2016 EGD: Gastritis-->PPI.  [name] GERD (gastroesophageal reflux disease)   . History of chicken pox   . History of measles as a child   . Hypertension   . Hyponatremia    a. 08/2016 in setting of HCTZ Rx.  . Mesenteric lymphadenopathy 04/08/2017  . Multiple thyroid nodules    Past Surgical History:  Past Surgical History:  Procedure Laterality Date  . ABDOMINAL HYSTERECTOMY  2003   due to heavy periods and clotting during menses  . BREAST LUMPECTOMY Left 1990  . CHOLECYSTECTOMY  2010  . COLONOSCOPY WITH PROPOFOL N/A 09/07/2016   Procedure: COLONOSCOPY WITH PROPOFOL;  Surgeon: Lucilla Lame, MD;  [facility] ENDOSCOPY;  Service: Endoscopy;  Laterality: N/A;  . CORONARY STENT INTERVENTION N/A 06/27/2020   Procedure: CORONARY STENT INTERVENTION;  Surgeon: Sherren Mocha, MD;  Location: [place] CV LAB;  Service: Cardiovascular;  Laterality: N/A;  . DIALYSIS/PERMA CATHETER INSERTION N/A 11/16/2018   Procedure: DIALYSIS/PERMA CATHETER INSERTION;  Surgeon: Algernon Huxley, MD;  Location: [place] CV LAB;  Service: Cardiovascular;  Laterality: N/A;  . DIALYSIS/PERMA CATHETER REMOVAL N/A 08/16/2019   Procedure: DIALYSIS/PERMA CATHETER REMOVAL;  Surgeon: Algernon Huxley, MD;   Location: [place] CV LAB;  Service: Cardiovascular;  Laterality: N/A;  . DILATION AND CURETTAGE OF UTERUS  1985  . ESOPHAGOGASTRODUODENOSCOPY (EGD) WITH PROPOFOL N/A 09/07/2016   Procedure: ESOPHAGOGASTRODUODENOSCOPY (EGD) WITH PROPOFOL;  Surgeon: Lucilla Lame, MD;  Location: [facility] ENDOSCOPY;  Service: Endoscopy;  Laterality: N/A;  . GALLBLADDER SURGERY  2012  . INTRAMEDULLARY (IM) NAIL INTERTROCHANTERIC Right 04/23/2020   Procedure: INTRAMEDULLARY (IM) NAIL INTERTROCHANTRIC;  Surgeon: Thornton Park, MD;  Location: [facility] ORS;  Service: Orthopedics;  Laterality: Right;  . INTRAVASCULAR IMAGING/OCT N/A 06/27/2020   Procedure: INTRAVASCULAR IMAGING/OCT;  Surgeon: Sherren Mocha, MD;  Location: [place] CV LAB;  Service: Cardiovascular;  Laterality: N/A;  . KNEE SURGERY     Dr. Tamala Julian  . LEFT HEART CATH AND CORONARY ANGIOGRAPHY N/A 06/25/2020   Procedure: LEFT HEART CATH AND CORONARY ANGIOGRAPHY;  Surgeon: Troy Sine, MD;  Location: [place] CV LAB;  Service: Cardiovascular;  Laterality: N/A;  . TONSILLECTOMY    . TONSILLECTOMY AND ADENOIDECTOMY  1970  . UPPER GI ENDOSCOPY  03/02/2006   H. Pylori negative; Dr. Allen Norris    Assessment / Plan / Recommendation Clinical Impression 70 year old female with past medial history of ESRD on peritoneal dialysis, GERD, CAD, seizures, CAD with reduced EF of 25 to 30%, anemia, anxiety and depression. Patient with discharge from [facility] CIR 07/24/2020 for 20 days for debility from cdiff colitis and underwent coronary stent on 06/27/2020. Presented 07/25/2020 via EMS to [facility] after being found progressively less conscious by husband. Reported a  small fall/sat down per husbandbut did not hit head or have LOC. She asked to take her Ambien and melatonin around 6 or 7 pm due to fatigue and went to sleep very quickly per family. Husband unable to arouse her a couple of hours later, so EMS was called. Found to have a large left subdural hematoma causing substantial  Left to right midline shift. Reversal of anticoagulation, intubation and [facility]. On 3/25 went to OR for emergent hemi-crani of SDH. Started on antibiotics for hypotension, elevated lactate and leukocytosis. Found to have new onset language deficits and left facial weakness. CT brain stable and SD drain removed 3/27 postoperatively. Some worsening aphasia on 3/27 with repeat CT scan and restarted cEEG monitoring. Switched Keppra to Brivaracetam since on PD. On 3/28 clinical seizure manifested by right face myoclonic jerking and speech arrest but patient alert and interactive. Given Ativan and loaded with Vimpat. No electrographic correlation with EEG . On dexamethasone per NSU. Some lethargy noted felt due to anti seizure meds.Nephrology following for nightly PD, Chronic hyponatremia. Na at baseline in high 120's. NSU recommends for NA >130. Resumed 3% for hyponatremia.Has had issues with hypotension, held metoprolol. Started home bicarb tablets. Started calorie count. BP low at HS so discontinued Lisinopril. Eating so poorly So Cortrak was placed 4/1 and then removed. Currently holding DAPT and to resume antiplatelet asap per Cardiology. Per NSU ASA can be restarted on day 7 post NSU 4/1.Patient's medical record [place] been reviewed by the rehabilitation admission coordinator and physician.   Pt presents with mild-moderate cognitive impairments, deficits include sustained attention, mildly complex problem solving, emergent awareness and short term recall. SLP initiated administered subsections of CLQT due to pt denying cognitive changes and supporting driving and completing medication/time management as baseline, family not present to confirm. Pt demonstrated impairments in attention tasks (symbol trial, symbol cancelation) and executive function tasks (maze), noting deficits in sustained attention, recall and delayed  processing. Pt demonstrated increased error awareness but required max A verbal cues to correct errors. Formal cognitive linguistic assessment utilizing Cognistat indicated; moderate deficits in construction task (2) and sustained attention (2), mild deficits in immediate/delayed recall and all remaining sections WFL. Pt was able to name 6 animals within a minute timeframe (n=>15), appeared due to cognitive deficits in memory and attention verse word finding. Pt demonstrated no deficits in word finding during conversation, picture description and conformation naming tasks. Pt denies changes in swallowing nor speech. Pt would benefit from skilled ST services in order to maximize functional independence and reduce burden of care, requiring 24 hour supervision at discharge with continued skilled ST services.   Skilled Therapeutic Interventions          Skilled ST services focused on cognitive skills. SLP facilitated administration of cognitive linguistic formal assessment and provided education of results. SLP and pt collaborated to set goals for cognitive linguistic needs during length of stay. All questions answered to satisfaction.  Pt was left in room with call bell within reach and bed alarm set. SLP recommends to continue skilled services.  SLP Assessment  Patient will need skilled [place] Pathology Services during CIR admission    Recommendations  Patient destination: Home Follow up Recommendations: Home Health SLP;Outpatient SLP;24 hour supervision/assistance Equipment Recommended: None recommended by SLP    SLP Frequency 3 to 5 out of 7 days   SLP Duration  SLP Intensity  SLP Treatment/Interventions 2 weeks  Minumum of 1-2 x/day,  30 to 90 minutes  Cognitive remediation/compensation;Cueing hierarchy;Functional tasks;Medication managment;Patient/family education;Internal/external aids    Pain Pain Assessment Pain Scale: 0-10 Pain Score: 0-No pain Faces Pain Scale: No  hurt  Prior Functioning Cognitive/Linguistic Baseline: Information not available Type of Home: House  Lives With: Spouse Available Help at Discharge: Family;Available 24 hours/day Vocation: Retired  Programmer, systems Overall Cognitive Status: Impaired/Different from baseline Arousal/Alertness: Awake/alert Orientation Level: Oriented X4 Attention: Sustained Sustained Attention: Impaired Memory: Impaired Memory Impairment: Decreased recall of new information;Storage deficit;Retrieval deficit Awareness: Impaired Awareness Impairment: Emergent impairment Problem Solving: Impaired Problem Solving Impairment:  (mildly complex) Safety/Judgment: Appears intact  Comprehension Auditory Comprehension Overall Auditory Comprehension: Appears within functional limits for tasks assessed Commands: Within Functional Limits Conversation: Complex Expression Expression Primary Mode of Expression: Verbal Verbal Expression Overall Verbal Expression: Impaired Initiation: No impairment Level of Generative/Spontaneous Verbalization: Conversation Naming: Impairment Confrontation: Within functional limits Divergent: 25-49% accurate Interfering Components: Attention Written Expression Dominant Hand: Right Written Expression: Not tested Oral Motor Oral Motor/Sensory Function Overall Oral Motor/Sensory Function: Within functional limits Motor Speech Overall Motor Speech: Appears within functional limits for tasks assessed Respiration: Within functional limits Phonation: Normal Resonance: Within functional limits Articulation: Within functional limitis Intelligibility: Intelligible Motor Planning: Witnin functional limits Motor Speech Errors: Not applicable  Care Tool Care Tool Cognition Expression of Ideas and Wants Expression of Ideas and Wants: Some difficulty - exhibits some difficulty with expressing needs and ideas (e.g, some words or finishing thoughts) or speech is not  clear   Understanding Verbal and Non-Verbal Content Understanding Verbal and Non-Verbal Content: Usually understands - understands most conversations, but misses some part/intent of message. Requires cues at times to understand   Memory/Recall Ability *first 3 days only         Short Term Goals: Week 1: SLP Short Term Goal 1 (Week 1): Pt will demonstrate sustained attention in functional tasks in 10 minue intervals with min A verbal cues for redirection. SLP Short Term Goal 2 (Week 1): Pt will demonstrate mildly complex problem solving skills in functional tasks with min A verbal cues. SLP Short Term Goal 3 (Week 1): Pt will demonstrate self-monitor and self-correction in functional errors with min A verbal cues. SLP Short Term Goal 4 (Week 1): Pt will demonstrate recall of daily, novel information with supervision A verbal cues for visual aids.  Refer to Care Plan for Long Term Goals  Recommendations for other services: None   Discharge Criteria: Patient will be discharged from SLP if patient refuses treatment 3 consecutive times without medical reason, if treatment goals not met, if there is a change in medical status, if patient makes no progress towards goals or if patient is discharged from hospital.  The above assessment, treatment plan, treatment alternatives and goals were discussed and mutually agreed upon: by patient  Reeda Soohoo  [facility] 08/05/2020, 11:46 AM

## 2020-08-05 NOTE — Progress Notes (Signed)
[facility] Progress Note  Subjective: no new c/o's  Vitals:   08/04/20 1949 08/05/20 0500 08/05/20 0537 08/05/20 0645  BP: 112/74  118/68 132/75  Pulse: 84  84 87  Resp: 20  20 18   Temp: (!) 97.4 F (36.3 C)  99.1 F (37.3 C) 99 F (37.2 C)  TempSrc: Oral  Oral Oral  SpO2: 100%     Weight:  54 kg  54 kg  Height:        Exam:  alert, nad   no jvd  Chest cta bilat  Cor reg no RG  Abd soft ntnd no ascites +PD cath   Ext 2+ diffuse LE / hip edema bilat   Alert, NF, ox3, gen weakness, deconditioned   Na 130  K 4.0  CO2 23  BUN 26  Cr 2.85  Alb 1.1  Dialysis Orders: Home PDin [facility] followed by Dr. Candiss Norse. 4 exchanges overnight, 2000 cc and 1.5 hrs each.   Assessment/Plan: 1. Debility: 2/2 Multiple med issues mainly now SDH recovery, also recent nstemi w/ CAD and low EF.  Readmitted to CIR on 08/04/20.  2. Fall w/ subdural hematoma - sp SDH evacuation at [facility] on 07/24/20 3. Vol overload - sig LE dependent edema and UE edema. Up 5- 10kg by wts. Will need extra volume removal w/ PD.  4. ESRD -[facility] in [place], [place], f/b Dr Candiss Norse of [place]. Good solute control per the labs. Plan PD tonight and ^UF w/ half 1.5% and half 2.5% bags.  5. Multivessel CAD-  with reduced EF 25/30% by recent cath in Feb 2022. Per TCTS was not CABG candidate. UnderwentPCI to LAD. At [facility] repeat echo per family showed improved EF 55%.  6. Chronichyponatremia - did not improve last admit w/ extra volume removal. Pt became orthostatic/ dehydrated and required IVF's. Watching for now, if gets worse will consider fluid restriction and NaCl tabs.  7. Metabolic acidosis - getting bid sod bicarb 8. HTN/volume - continues on metoprolol xl 25 qd 9. Anemiaof ESRD- was getting Aranesp 100 MCG q Sat while here last admit. Awaiting admit labs. Last tsat 91%on 06/20/20. Ferritin was 1131.  9. Nutrition: check albumin. Renal diet.  10. Seizures - per primary team 11. H/o C diff - rx'd in  Feb 2022     Amy [name] 08/05/2020, 2:48 PM   Recent Labs  Lab 08/05/20 0720  K 4.0  BUN 26*  CREATININE 2.85*  CALCIUM 8.1*   Inpatient medications: . (feeding supplement) PROSource Plus  30 mL Oral BID BM  . aspirin EC  81 mg Oral Daily  . atorvastatin  40 mg Oral Daily  . brivaracetam  75 mg Oral BID  . butalbital-acetaminophen-caffeine  1 tablet Oral TID with meals  . citalopram  10 mg Oral Daily  . docusate  50 mg Oral BID  . gentamicin cream  1 application Topical Daily  . heparin  5,000 Units Subcutaneous Q12H  . kidney failure book   Does not apply Once  . lacosamide  75 mg Oral BID  . melatonin  5 mg Oral QHS  . metoCLOPramide  5 mg Oral Q8H  . metoprolol tartrate  6.25 mg Oral BID  . pantoprazole  40 mg Oral Daily  . sodium bicarbonate  650 mg Oral BID  . triamcinolone   Topical TID   . dialysis solution 1.5% low-MG/low-CA    . dialysis solution 2.5% low-MG/low-CA     acetaminophen, alum & mag hydroxide-simeth, bisacodyl, camphor-menthol,  guaiFENesin-dextromethorphan, dianeal solution for CAPD/[name] with heparin, melatonin, methocarbamol, polyethylene glycol, prochlorperazine **OR** prochlorperazine **OR** prochlorperazine, sodium phosphate, traZODone

## 2020-08-06 DIAGNOSIS — R9431 Abnormal electrocardiogram [ECG] [EKG]: Secondary | ICD-10-CM

## 2020-08-06 DIAGNOSIS — G8918 Other acute postprocedural pain: Secondary | ICD-10-CM

## 2020-08-06 DIAGNOSIS — E871 Hypo-osmolality and hyponatremia: Secondary | ICD-10-CM | POA: Diagnosis not present

## 2020-08-06 DIAGNOSIS — S065X3D Traumatic subdural hemorrhage with loss of consciousness of 1 hour to 5 hours 59 minutes, subsequent encounter: Secondary | ICD-10-CM | POA: Diagnosis not present

## 2020-08-06 DIAGNOSIS — K3184 Gastroparesis: Secondary | ICD-10-CM | POA: Diagnosis not present

## 2020-08-06 MED ORDER — PROCHLORPERAZINE 25 MG RE SUPP: 12.5000 mg | Freq: Three times a day (TID) | RECTAL | Status: DC | PRN

## 2020-08-06 MED ORDER — PROCHLORPERAZINE EDISYLATE 10 MG/2ML IJ SOLN: 5.0000 mg | Freq: Three times a day (TID) | INTRAMUSCULAR | Status: DC | PRN

## 2020-08-06 MED ORDER — PROCHLORPERAZINE MALEATE 5 MG PO TABS
5.0000 mg | ORAL_TABLET | Freq: Three times a day (TID) | ORAL | Status: DC | PRN
  Administered 2020-08-06 – 2020-09-07 (×12): 5 mg via ORAL
  Filled 2020-08-06 (×13): qty 1

## 2020-08-06 MED ORDER — METOCLOPRAMIDE HCL 5 MG PO TABS
5.0000 mg | ORAL_TABLET | Freq: Three times a day (TID) | ORAL | Status: DC
  Administered 2020-08-06 – 2020-09-12 (×107): 5 mg via ORAL
  Filled 2020-08-06 (×110): qty 1

## 2020-08-06 NOTE — Progress Notes (Addendum)
Physical Therapy Session Note  Patient Details  Name: Amy [name] MRN: [id number] Date of Birth: December 08, 1950  Today's Date: 08/06/2020 PT Individual Time: 1415-1515 PT Individual Time Calculation (min): 60 min   Short Term Goals: Week 1:  PT Short Term Goal 1 (Week 1): Patient to perform bed mobility with mod A. PT Short Term Goal 2 (Week 1): Patient to perform sit to stand with mod A. PT Short Term Goal 3 (Week 1): Bed<>chair transfers with mod A. PT Short Term Goal 4 (Week 1): Patient to initiate ambulation.  Skilled Therapeutic Interventions/Progress Updates:    Patient in supine and agreeable to in room therapies.  Performed bed exercises including heel slides with A, ankle pumps, SAQ (2 sets), hooklying hip abduction with orange t-band, bridging (2 sets), hooklying hip flexion with A, and lateral trunk rotation all x 10 reps.  Patient rolled to R with mod A, side to sit max A.  Checked BP supine 122/66, sitting 109/54.  Seated EOB today with S initially, then pt leaning to R onto PT seated next to her.  Performed scoot pivot transfer to w/c with max A multi step transfer to get fully into chair due to LE weakness.  Patient in w/c assisted to sink.  Assisted to set up for brushing teeth.  Patient reached for toothbrush and brushed in sitting, then sit to stand with max A to sink to spit and rinse mouth with A as pt needing mod A for balance and bilateral UE support. Only up for about 45 seconds then needing to sit.  Patient transferred to bed with max A using bed rail and increased time. Sit to supine mod A for LE's. Rolled to doff pants with max A due to soiled.  Assist for hygiene in sidelying as incontinent of bowel, then pt requesting bed pan.  Placed and assisted to doff ace wraps and educated pt they are for BP management when up, but concern for skin breakdown wearing them in bed.  RN made aware of pt on bed pan and for ace wraps only when up.  Left with call bell in reach and bed alarm  active.   Therapy Documentation Precautions:  Precautions Precautions: Fall Precaution Comments: seizure precautions Restrictions Weight Bearing Restrictions: No Other Position/Activity Restrictions: head incision Pain: Pain Assessment Pain Score: 7  Pain Type: Chronic pain Pain Location: Head Pain Orientation: Anterior Pain Descriptors / Indicators: Headache Pain Onset: On-going Pain Intervention(s): RN made aware    Therapy/Group: Individual Therapy  Reginia Naas  Magda Kiel, PT 08/06/2020, 2:33 PM

## 2020-08-06 NOTE — Progress Notes (Signed)
Episode of vomiting this AM during breakfast, prior to taking medications. Emesis noted to be breakfast contents. Patient received AM medications with no further issues along with PRN Compazine PO per orders. MD aware. Call bell in reach. Will continue to monitor.

## 2020-08-06 NOTE — Patient Care Conference (Signed)
Inpatient RehabilitationTeam Conference and Plan of Care Update Date: 08/06/2020   Time: 11:37 AM    Patient Name: Amy [name]      Medical Record Number: [id number]  Date of Birth: 1950/10/21 Sex: Female         Room/Bed: 4W06C/4W06C-01 Payor Info: Payor: MEDICARE / Plan: MEDICARE PART A AND B / Product Type: *No Product type* /    Admit Date/Time:  08/04/2020  1:38 PM  Primary Diagnosis:  Subdural hemorrhage following injury [facility])  Hospital Problems: Principal Problem:   Subdural hemorrhage following injury [facility]) Active Problems:   Acute on chronic anemia   Postoperative pain    Expected Discharge Date: Expected Discharge Date: 09/02/20  Team Members Present: Physician leading conference: Dr. Delice Lesch Care Coodinator Present: Dorien Chihuahua, RN, BSN, CRRN;Becky Dupree, LCSW Nurse Present: Dorien Chihuahua, RN PT Present: Magda Kiel, PT OT Present: Lillia Corporal, OT SLP Present: Charolett Bumpers, SLP PPS Coordinator present : Gunnar Fusi, SLP     Current Status/Progress Goal Weekly Team Focus  Bowel/Bladder   Pt is continent x2.  Pt will remain continent x2.  Assess q shift and prn.   Swallow/Nutrition/ Hydration             ADL's   Max A adl at bed level, bilateral UE edema, right side mild weakness  min A/CS  upright tolerance, adl and transfer training, core strength   Mobility   max A squat pivot transfer, +2 sit to stand, no ambulation yet, w/c mobility min A 30'  CGA to S overall  activity tolerance, sitting balance, general strength, upright tolerance, try to progress to gait   Communication             Safety/Cognition/ Behavioral Observations  Mod A  Mod I recall with aid, Supervision A  Mildly complex problem solving, emergent awareness, short term recall and sustained attention   Pain   Pt reports chronic headache; managed with current regimen.  Pain will remain <3.  Assess q shift and prn.   Skin   Bilateral bruising to all extremities, BUE edema and  weeping, redness to buttocks, PD abdomen cath.  Skin will continue to heal and remain infection free.  Assess q shift and prn.     Discharge Planning:  Plan to return home with husband and hired assist. Martin Majestic home last admit needing 24/7 supervision, so should already be in place   Team Discussion: Max assist tranfers with no ambulation yet. Bed level ADLs. Max assist overall for OT due to weakness and overall deconditioned state.  Continue to note nausea/vomiting with meals despite  Medication addition/modifications. Continue PD for HF;edema everywhere.  Patient on target to meet rehab goals: Goals set for CGA-Supervision overall and Min assist for OT  *See Care Plan and progress notes for long and short-term goals.   Revisions to Treatment Plan:   Teaching Needs: Transfers, toileting, medications, safety ,etc.   Current Barriers to Discharge: Decreased caregiver support and Peritoneal Dialysis  Possible Resolutions to Barriers: Recommended hired caregivers to assist spouse     Medical Summary Current Status: Right hemiparesis and aphasia secondary to large left SDH s/p craniotomy  Barriers to Discharge: Other (comments);Medical stability;Behavior;Decreased family/caregiver support   Possible Resolutions to [employer]: Therapies, follow labs - K, Na+, monitor for seizures, optimize meds for N/V in accordance with QTc   Continued Need for Acute Rehabilitation Level of Care: The patient requires daily medical management by a physician with specialized training in physical  medicine and rehabilitation for the following reasons: Direction of a multidisciplinary physical rehabilitation program to maximize functional independence : Yes Medical management of patient stability for increased activity during participation in an intensive rehabilitation regime.: Yes Analysis of laboratory values and/or radiology reports with any subsequent need for medication adjustment and/or  medical intervention. : Yes   I attest that I was present, lead the team conference, and concur with the assessment and plan of the team.   Dorien Chihuahua B 08/06/2020, 3:00 PM

## 2020-08-06 NOTE — Progress Notes (Signed)
Speech Language Pathology Daily Session Note  Patient Details  Name: Amy [name] MRN: [id number] Date of Birth: [birth year]  Today's Date: 08/06/2020 SLP Individual Time: 2536-6440 SLP Individual Time Calculation (min): 59 min  Short Term Goals: Week 1: SLP Short Term Goal 1 (Week 1): Pt will demonstrate sustained attention in functional tasks in 10 minue intervals with min A verbal cues for redirection. SLP Short Term Goal 2 (Week 1): Pt will demonstrate mildly complex problem solving skills in functional tasks with min A verbal cues. SLP Short Term Goal 3 (Week 1): Pt will demonstrate self-monitor and self-correction in functional errors with min A verbal cues. SLP Short Term Goal 4 (Week 1): Pt will demonstrate recall of daily, novel information with supervision A verbal cues for visual aids.  Skilled Therapeutic Interventions:Skilled ST services focused on cognitive skills. SLP initiated memory notebook. Pt required mod A verbal cues to recall am type of therapy and therapist name, but recalled events with min A verbal cues. SLP facilitated basic problem solving, error awareness, short term recall and sustained attention in ALFA money and medication management. Pt required min A verbal cues for problem solving/error awareness in subtract tasks of simple change and with use of written aid required mod A verbal cues for recall within task. Pt required supervision A verbal cues for medication management in verbal problem solving and in functional problem solving, likely due to memory deficits required min A verbal cues. Pt answered 3 out 4 daily math questions from ALFA using written aid with mod A verbal cues for problem solving/error awareness, continued to be impacted by working memory. Pt required mod A verbal cues to recall ST event when witting in notebook. Pt demonstrated dramatically improved sustained attention in 30 minute intervals, poor attention noted on evaluation suggest due more to  memory deficits verse attention. Pt was left in room with call bell within reach and bed alarm set. SLP recommends to continue skilled services.     Pain Pain Assessment Pain Score: 0-No pain Pain Type: Chronic pain Pain Location: Head Pain Orientation: Anterior Pain Descriptors / Indicators: Headache Pain Onset: On-going Pain Intervention(s): RN made aware  Therapy/Group: Individual Therapy  Betul Brisky  [facility] 08/06/2020, 3:16 PM

## 2020-08-06 NOTE — Progress Notes (Signed)
Occupational Therapy TBI Note  Patient Details  Name: Amy [name] MRN: [id number] Date of Birth: 27-Sep-1950  Today's Date: 08/06/2020 OT Individual Time: 1100-1200 OT Individual Time Calculation (min): 60 min    Short Term Goals: Week 1:  OT Short Term Goal 1 (Week 1): patient will complete LB adl bed level with mod A OT Short Term Goal 2 (Week 1): Patient will complete UB adl w/c level with min A OT Short Term Goal 3 (Week 1): Patient will complete bed mobility with mod A OT Short Term Goal 4 (Week 1): patient will complete functional sit or stand pivot transfer with mod A  Skilled Therapeutic Interventions/Progress Updates:    1:1. Pt received in bed agreeable to OT with encouragement to participate. Focus of session on edema management, dressing, sitting balance, attention, and bed mbility. Pt requires MAX A for BLE/trunk management to EOB. Pt with poor attention/motor planning impacting BADL performance/balance. Pt requires MIN A for sitting balance and MIN A to pull shirt down back seated EOB. Pt returns to supine for OT to rewrap BLE with ACE wraps as they had moved down LEs. Pt completes sup>sit EOB to don underwear and pants with overall MAX A and MAX A sit to stand with pt placing BUE and OT shoulders to encourage upright posture and pt daughter advancing LB clothing past waiste. Exited session with pt seated in bed, exit alarm on and call light in reach   Therapy Documentation Precautions:  Precautions Precautions: Fall Precaution Comments: seizure precautions Restrictions Weight Bearing Restrictions: No Other Position/Activity Restrictions: head incision General:   Vital Signs: Therapy Vitals Temp: 98.5 F (36.9 C) Temp Source: Oral Pulse Rate: 74 Resp: 18 BP: 121/67 Patient Position (if appropriate): Lying Oxygen Therapy SpO2: 100 % Pain: Pain Assessment Pain Score: Asleep Agitated Behavior Scale: TBI  Observation Details Observation Environment: Pt  room Start of observation period - Date: 08/06/20 Start of observation period - Time: 1100 End of observation period - Date: 08/06/20 End of observation period - Time: 1206 Agitated Behavior Scale (DO NOT LEAVE BLANKS) Short attention span, easy distractibility, inability to concentrate: Present to a moderate degree Impulsive, impatient, low tolerance for pain or frustration: Absent Uncooperative, resistant to care, demanding: Absent Violent and/or threatening violence toward people or property: Absent Explosive and/or unpredictable anger: Absent Rocking, rubbing, moaning, or other self-stimulating behavior: Absent Pulling at tubes, restraints, etc.: Absent Wandering from treatment areas: Absent Restlessness, pacing, excessive movement: Present to a slight degree Repetitive behaviors, motor, and/or verbal: Absent Rapid, loud, or excessive talking: Absent Sudden changes of mood: Absent Easily initiated or excessive crying and/or laughter: Absent Self-abusiveness, physical and/or verbal: Absent Agitated behavior scale total score: 17  ADL: ADL Eating: Supervision/safety,Set up Where Assessed-Eating: Bed level Grooming: Minimal assistance Where Assessed-Grooming: Bed level Upper Body Bathing: Moderate assistance Where Assessed-Upper Body Bathing: Bed level Lower Body Bathing: Dependent Where Assessed-Lower Body Bathing: Bed level Upper Body Dressing: Maximal assistance Where Assessed-Upper Body Dressing: Bed level Lower Body Dressing: Dependent Where Assessed-Lower Body Dressing: Bed level Toileting: Dependent Where Assessed-Toileting: Bed level Vision   Perception    Praxis   Exercises:   Other Treatments:     Therapy/Group: Individual Therapy  Tonny Branch 08/06/2020, 6:50 AM

## 2020-08-06 NOTE — Progress Notes (Signed)
Patient ID: Amy [name], female   DOB: 09-12-1950, 70 y.o.   MRN: [id number] Met with pt and spoke with husband via telephone to inform of team conference goals of CGA level and target discharge date 5/3, which pt thought was long but after discussion of where she is now plus 2 then she understood more. She continues to have nausea and MD is aware of this. Has removed protein drink which makes her sick. She plans on asking husband to bring her some food in which she likes better. Husband said: " All she has to do is ask it will be done." Continue to work on discharge needs.

## 2020-08-06 NOTE — Progress Notes (Signed)
[place] PHYSICAL MEDICINE & REHABILITATION PROGRESS NOTE  Subjective/Complaints: Patient seen sitting up in bed this morning.  She states she slept well overnight.  She states he had good first therapy yesterday.  She states that she had some nausea and vomiting after taking first dose this morning.  However, discussed with nursing, who states that patient with nausea and vomiting after meals.  She was seen by nephrology yesterday, notes reviewed-considering fluid restriction.  ROS: Denies CP, SOB, N/V/D  Objective: Vital Signs: Blood pressure 133/75, pulse 85, temperature 98.6 F (37 C), temperature source Oral, resp. rate 18, height 5\' 4"  (1.626 m), weight 54.6 kg, SpO2 98 %. No results found. No results for input(s): WBC, HGB, HCT, PLT in the last 72 hours. Recent Labs    08/05/20 0720  NA 130*  K 4.0  CL 97*  CO2 23  GLUCOSE 102*  BUN 26*  CREATININE 2.85*  CALCIUM 8.1*    Intake/Output Summary (Last 24 hours) at 08/06/2020 1006 Last data filed at 08/06/2020 0855 Gross per 24 hour  Intake 240 ml  Output --  Net 240 ml        Physical Exam: BP 133/75 (BP Location: Right Arm)   Pulse 85   Temp 98.6 F (37 C) (Oral)   Resp 18   Ht 5\' 4"  (1.626 m)   Wt 54.6 kg   SpO2 98%   BMI 20.66 kg/m  Constitutional: No distress . Vital signs reviewed. HENT: Left [place] site with staples. Left facial edema Eyes: EOMI. No discharge. Cardiovascular: No JVD.  RRR. Respiratory: Normal effort.  No stridor.  Bilateral clear to auscultation. GI: Non-distended.  BS +.  + PD site. Skin: Warm and dry.  Intact. Psych: Flat.  Normal behavior. Musc: Generalized edema.  No tenderness in extremities. Neuro: Alert Motor: LUE: 4-4+/5 proximal distal, unchanged RUE: 4 --4/5 proximal distal, unchanged LLE: Hip flexion 2/5, knee extension 2/5, ankle dorsiflexion 3/5 RLE: Hip flexion 2/5, knee extension 2/5, ankle dorsiflexion 3/5  Assessment/Plan: 1. Functional deficits which require  3+ hours per day of interdisciplinary therapy in a comprehensive inpatient rehab setting.  Physiatrist is providing close team supervision and 24 hour management of active medical problems listed below.  Physiatrist and rehab team continue to assess barriers to discharge/monitor patient progress toward functional and medical goals   Care Tool:  Bathing    Body parts bathed by patient: Right arm,Left arm,Chest,Abdomen,Front perineal area,Face   Body parts bathed by helper: Right arm,Left arm,Front perineal area,Buttocks,Right upper leg,Left upper leg,Right lower leg,Left lower leg     Bathing assist Assist Level: Maximal Assistance - Patient 24 - 49%     Upper Body Dressing/Undressing Upper body dressing   What is the patient wearing?: Pull over shirt    Upper body assist Assist Level: Maximal Assistance - Patient 25 - 49%    Lower Body Dressing/Undressing Lower body dressing      What is the patient wearing?: Incontinence brief     Lower body assist Assist for lower body dressing: Total Assistance - Patient < 25%     Toileting Toileting    Toileting assist Assist for toileting: Total Assistance - Patient < 25% (bed pan)     Transfers Chair/bed transfer  Transfers assist     Chair/bed transfer assist level: Maximal Assistance - Patient 25 - 49% Chair/bed transfer assistive device: Armrests,Bedrails   Locomotion Ambulation   Ambulation assist   Ambulation activity did not occur: Safety/medical concerns  Walk 10 feet activity   Assist  Walk 10 feet activity did not occur: Safety/medical concerns        Walk 50 feet activity   Assist Walk 50 feet with 2 turns activity did not occur: Safety/medical concerns         Walk 150 feet activity   Assist Walk 150 feet activity did not occur: Safety/medical concerns         Walk 10 feet on uneven surface  activity   Assist Walk 10 feet on uneven surfaces activity did not occur:  Safety/medical concerns         Wheelchair     Assist Will patient use wheelchair at discharge?: Yes Type of Wheelchair: Manual    Wheelchair assist level: Minimal Assistance - Patient > 75% Max wheelchair distance: 30'    Wheelchair 50 feet with 2 turns activity    Assist    Wheelchair 50 feet with 2 turns activity did not occur: Safety/medical concerns       Wheelchair 150 feet activity     Assist  Wheelchair 150 feet activity did not occur: Safety/medical concerns        Medical Problem List and Plan: 1.  Right hemiparesis and aphasia secondary to large left SDH s/p craniotomy  Continue CIR  Team conference today to discuss current and goals and coordination of care, home and environmental barriers, and discharge planning with nursing, case manager, and therapies. Please see conference note from today as well.  2.  Antithrombotics: -DVT/anticoagulation:  Mechanical: Sequential compression devices, below knee Bilateral lower extremities             -antiplatelet therapy: N/A due to bleed.  3. Chronic migraines/ Pain Management: Uses Fioricet 3-4 times a day on average.   Controlled with meds on 4/6  Monitor with increased exertion 4. Mood: LCSW to follow for evaluation and support.              -antipsychotic agents: N/A 5. Neuropsych: This patient is not fully capable of making decisions on her own behalf. 6. Skin/Wound Care: Monitor wound for healing.             --offer supplements. Family to provide meals from home as able.  7. Fluids/Electrolytes/Nutrition: Continue PD--does not need fluid restriction.              --has had chronic issues with variable intake as well as N/V likely due to gastroparesis. 8. Chronic Hypotension:  TEDs when out of bed             --encourage intake. 9.  ESRD on PD: Nephrology to assist/manage PD.  10.  Recurrent Seizures: Has been seizure free on Brivaracetam and Lacosamide bid.              --Lacosamide was  decreased on 03/31 to avoid sedative SE.  No breakthrough seizures since admission 11. CAD/NSTEMI s/p PCI: Treated medically.   Off DAPT due to bleed             --Continue Lipitor.   Metoprolol low-dose resumed  12. Acute on chronic anemia:   Hemoglobin 14.9 on 3/25  Continue to monitor 13. H/o Depression: On Celexa 14. Sick Euthyroid: Will need follow up as illness resolves.              --TSH- 5.80/Free T4-1.2 (03/27) 15. Gastroparesis w/chronic N/V/anorexia: Has been ongoing per records review.              --question  failure of PD (husband reports no N/V with brief episode of HD in the past)  Reglan 5 3 times daily 16.  Hyponatremia             --chronic hyponatremia with Na-130-133 range  Sodium 130 on 4/5             --Sodium bicarb to manage metabolic acidosis. 17.  Severe hypoalbuminemia  Supplement initiated on 4/5 18.  Prolonged QTC  ECG reviewed, improved since last visit  Compazine and dose/frequency decreased   LOS: 2 days A FACE TO FACE EVALUATION WAS PERFORMED  Saylee Sherrill Lorie Phenix 08/06/2020, 10:06 AM

## 2020-08-06 NOTE — Progress Notes (Signed)
[facility] Progress Note  Subjective: no new c/o's  Vitals:   08/05/20 1627 08/05/20 1927 08/06/20 0434 08/06/20 0700  BP: 120/68 131/78 121/67 133/75  Pulse: 79 86 74 85  Resp: 18 17 18 18   Temp: 97.9 F (36.6 C) 98 F (36.7 C) 98.5 F (36.9 C) 98.6 F (37 C)  TempSrc: Oral Oral Oral Oral  SpO2:  99% 100% 98%  Weight: 56.2 kg   54.6 kg  Height:        Exam:  alert, nad   no jvd  Chest cta bilat  Cor reg no RG  Abd soft ntnd no ascites +PD cath   Ext 2+ diffuse LE / hip edema bilat   Alert, NF, ox3, gen weakness, deconditioned    BUN 26  Cr 2.85  Alb 1.1  Dialysis Orders: Home PD[facility] followed by Dr. Candiss Norse. 4 exchanges overnight, 2000 cc and 1.5 hrs each.   Assessment/Plan: 1. Debility: 2/2 Multiple med issues mainly now SDH recovery, also recent nstemi w/ CAD and low EF.  Readmitted to CIR on 08/04/20.  2. Fall w/ subdural hematoma - sp SDH evacuation at [facility] on 07/24/20 3. Vol overload - sig LE dependent edema and UE edema. Dry wt probably in mid 40's (kgs). Will gradually UF w/ PD half 1.5% and half 2.5%. Good UF yest w/ 1.7 L off and wts are down accordingly. Dry wt goal for now is around 45kg.  4. ESRD -[facility] in [place], [place], f/b Dr Candiss Norse of [place]. Good solute control per the labs. Cont PD nightly.  5. Multivessel CAD-  with reduced EF 25/30% by recent cath in Feb 2022. Per TCTS was not CABG candidate. UnderwentPCI to LAD. At [facility] repeat echo per family showed improved EF 55%.  6. Chronichyponatremia - did not improve last admit w/ extra volume removal. If gets worse will consider fluid restriction +/-  NaCl tabs.  7. Metabolic acidosis - getting bid sod bicarb 8. HTN/volume - continues on metoprolol xl 25 qd 9. Anemiaof ESRD- was getting Aranesp 100 MCG q Sat while here last admit. Awaiting admit labs. Last tsat 91%on 06/20/20. Ferritin was 1131.  9. Nutrition: check albumin. Renal diet.  10. Seizures - per primary  team 11. H/o C diff - rx'd in Feb 2022     Amy [name] 08/06/2020, 11:04 AM   Recent Labs  Lab 08/05/20 0720  K 4.0  BUN 26*  CREATININE 2.85*  CALCIUM 8.1*   Inpatient medications: . (feeding supplement) PROSource Plus  30 mL Oral BID BM  . aspirin EC  81 mg Oral Daily  . atorvastatin  40 mg Oral Daily  . brivaracetam  75 mg Oral BID  . butalbital-acetaminophen-caffeine  1 tablet Oral TID with meals  . citalopram  10 mg Oral Daily  . docusate  50 mg Oral BID  . gentamicin cream  1 application Topical Daily  . heparin  5,000 Units Subcutaneous Q12H  . kidney failure book   Does not apply Once  . lacosamide  75 mg Oral BID  . melatonin  5 mg Oral QHS  . metoCLOPramide  5 mg Oral TID AC  . metoprolol tartrate  6.25 mg Oral BID  . pantoprazole  40 mg Oral Daily  . sodium bicarbonate  650 mg Oral BID  . triamcinolone   Topical TID   . dialysis solution 1.5% low-MG/low-CA    . dialysis solution 2.5% low-MG/low-CA     acetaminophen, alum & mag hydroxide-simeth,  bisacodyl, camphor-menthol, guaiFENesin-dextromethorphan, dianeal solution for CAPD/[name] with heparin, dianeal solution for CAPD/[name] with heparin, melatonin, methocarbamol, polyethylene glycol, prochlorperazine **OR** prochlorperazine **OR** prochlorperazine, sodium phosphate, traZODone

## 2020-08-07 DIAGNOSIS — E871 Hypo-osmolality and hyponatremia: Secondary | ICD-10-CM | POA: Diagnosis not present

## 2020-08-07 DIAGNOSIS — G8918 Other acute postprocedural pain: Secondary | ICD-10-CM | POA: Diagnosis not present

## 2020-08-07 DIAGNOSIS — S065X9A Traumatic subdural hemorrhage with loss of consciousness of unspecified duration, initial encounter: Secondary | ICD-10-CM

## 2020-08-07 DIAGNOSIS — S065XAA Traumatic subdural hemorrhage with loss of consciousness status unknown, initial encounter: Secondary | ICD-10-CM

## 2020-08-07 DIAGNOSIS — S065X3D Traumatic subdural hemorrhage with loss of consciousness of 1 hour to 5 hours 59 minutes, subsequent encounter: Secondary | ICD-10-CM | POA: Diagnosis not present

## 2020-08-07 DIAGNOSIS — D649 Anemia, unspecified: Secondary | ICD-10-CM | POA: Diagnosis not present

## 2020-08-07 MED ORDER — DELFLEX-LC/2.5% DEXTROSE 394 MOSM/L IP SOLN
INTRAPERITONEAL | Status: DC
  Administered 2020-08-07: 5000 mL via INTRAPERITONEAL

## 2020-08-07 MED ORDER — DELFLEX-LC/1.5% DEXTROSE 344 MOSM/L IP SOLN
INTRAPERITONEAL | Status: DC
  Administered 2020-08-07: 5000 mL via INTRAPERITONEAL

## 2020-08-07 NOTE — Progress Notes (Signed)
Speech Language Pathology Daily Session Note  Patient Details  Name: TANESIA BUTNER MRN: [id number] Date of Birth: Jun 24, 1950  Today's Date: 08/07/2020 SLP Individual Time: 1000-1100 SLP Individual Time Calculation (min): 60 min  Short Term Goals: Week 1: SLP Short Term Goal 1 (Week 1): Pt will demonstrate sustained attention in functional tasks in 10 minue intervals with min A verbal cues for redirection. SLP Short Term Goal 2 (Week 1): Pt will demonstrate mildly complex problem solving skills in functional tasks with min A verbal cues. SLP Short Term Goal 3 (Week 1): Pt will demonstrate self-monitor and self-correction in functional errors with min A verbal cues. SLP Short Term Goal 4 (Week 1): Pt will demonstrate recall of daily, novel information with supervision A verbal cues for visual aids.  Skilled Therapeutic Interventions: Skilled SLP intervention focused on cognition. Daughter was present during session. Pt demonstrated increased sustained attention and alertness this session. She completed color block matching task by replicating color patterns beside a visual model. She required increased time to complete this task and Mod A to recall SLPs instructions on colors to use to replace similar colors. Simple coin and bill counting task completed with min to mod A visual cues. SLP reviewed new medications with patient and daughter at end of session. Daughter was able to recall purpose for medications she took prior to hospitalization when given the medication name. Cont with therapy per plan of care.      Pain Pain Assessment Pain Scale: Faces Pain Score: 0-No pain  Therapy/Group: Individual Therapy  Darrol Poke Javaya [place] 08/07/2020, 12:42 PM

## 2020-08-07 NOTE — Progress Notes (Signed)
[facility] Progress Note  Subjective: [age] UF overnight. Per PT in the room leg edema is improving and her mobility is better.   Vitals:   08/06/20 1806 08/06/20 1940 08/07/20 0602 08/07/20 0736  BP: 123/72 118/70 124/70 101/69  Pulse: 83 80  100  Resp: 18 20 20 17   Temp: 98.6 F (37 C) 97.7 F (36.5 C) 98.2 F (36.8 C) 97.7 F (36.5 C)  TempSrc: Oral  Oral Oral  SpO2:  100% 100% 100%  Weight: 54.9 kg   53.3 kg  Height:        Exam:  alert, nad   no jvd  Chest cta bilat  Cor reg no RG  Abd soft ntnd no ascites +PD cath   Ext 2+ diffuse LE / hip edema bilat, slightly better   Alert, NF, ox3, gen weakness, deconditioned    BUN 26  Cr 2.85  Alb 1.1  Dialysis Orders: Home PDin [facility] followed by Dr. Candiss Norse. 4 exchanges overnight, 2000 cc and 1.5 hrs each.   Assessment/Plan: 1. Debility: 2/2 Multiple med issues mainly now SDH recovery, also recent nstemi w/ CAD and low EF.  Readmitted to CIR on 08/04/20.  2. Fall w/ subdural hematoma - sp SDH evacuation at [facility] on 07/24/20 3. Vol overload - sig LE dependent edema and UE edema. Dry wt probably in mid 40's (kgs). Will gradually UF w/ PD half 1.5% and half 2.5%. Good UF again last night, noticing some improvement in edema for now. Dry wt goal unclear, possibly around 45kg. Last admit 51kg on admit down to 41 kg on dc.  4. ESRD -[facility] in [place], [place], f/b Dr Candiss Norse of [place]. Good solute control per labs. Cont PD nightly.  5. Multivessel CAD-  with reduced EF 25-30% by recent cath in Feb 2022. Per TCTS was not CABG candidate. UnderwentPCI to LAD. At [facility] repeat echo per family showed improved EF 55%.  6. Chronichyponatremia - did not improve last admit w/ extra volume removal. If gets worse consider fluid restriction +/-  NaCl tabs.  7. Metabolic acidosis - getting bid sod bicarb 8. HTN/volume - continues on metoprolol xl 25 qd 9. Anemiaof ESRD- was getting Aranesp 100 MCG q Sat while here last  admit. Awaiting admit labs. Last tsat 91%on 06/20/20. Ferritin was 1131.  9. Nutrition: check albumin. Renal diet.  10. Seizures - per primary team 11. H/o C diff - rx'd in Feb 2022     [place] 08/07/2020, 2:30 PM   Recent Labs  Lab 08/05/20 0720  K 4.0  BUN 26*  CREATININE 2.85*  CALCIUM 8.1*   Inpatient medications: . (feeding supplement) PROSource Plus  30 mL Oral BID BM  . aspirin EC  81 mg Oral Daily  . atorvastatin  40 mg Oral Daily  . brivaracetam  75 mg Oral BID  . butalbital-acetaminophen-caffeine  1 tablet Oral TID with meals  . citalopram  10 mg Oral Daily  . docusate  50 mg Oral BID  . gentamicin cream  1 application Topical Daily  . heparin  5,000 Units Subcutaneous Q12H  . kidney failure book   Does not apply Once  . lacosamide  75 mg Oral BID  . melatonin  5 mg Oral QHS  . metoCLOPramide  5 mg Oral TID AC  . metoprolol tartrate  6.25 mg Oral BID  . pantoprazole  40 mg Oral Daily  . sodium bicarbonate  650 mg Oral BID  . triamcinolone cream  Topical TID   . dialysis solution 1.5% low-MG/low-CA    . dialysis solution 2.5% low-MG/low-CA     acetaminophen, alum & mag hydroxide-simeth, bisacodyl, camphor-menthol, guaiFENesin-dextromethorphan, dianeal solution for CAPD/[name] with heparin, dianeal solution for CAPD/[name] with heparin, melatonin, methocarbamol, polyethylene glycol, prochlorperazine **OR** prochlorperazine **OR** prochlorperazine, sodium phosphate, traZODone

## 2020-08-07 NOTE — Progress Notes (Signed)
Occupational Therapy Session Note  Patient Details  Name: Amy [name] MRN: [id number] Date of Birth: Apr 10, 1951  Today's Date: 08/07/2020 OT Individual Time: 1100-1200 OT Individual Time Calculation (min): 60 min    Short Term Goals: Week 1:  OT Short Term Goal 1 (Week 1): patient will complete LB adl bed level with mod A OT Short Term Goal 2 (Week 1): Patient will complete UB adl w/c level with min A OT Short Term Goal 3 (Week 1): Patient will complete bed mobility with mod A OT Short Term Goal 4 (Week 1): patient will complete functional sit or stand pivot transfer with mod A  Skilled Therapeutic Interventions/Progress Updates:    Patient in bed, daughter present, she states that pain is under control at this time.  She is easily distracted but oriented.  Completed LB bathing and dressing at bed level - note improving edema and LB mobility.  She is able to roll in bed with min a and lift legs to assist with washing overall max A, dependent for ace wraps and slipper socks, max A for pants.  Side lying to sitting edge of bed with mod A.  She notes mild dizziness upon sitting that resolves after a few minutes - BP in sitting 116/66, HR 85.  She is able to tolerate unsupported sitting for 20 minutes while she completed UB bathing with set up, donns [place] shirt min A and completes oral care with set up.  She tolerated trunk mobility and upperbody conditioning activities with min A for upright posture.  Max A for bilateral legs back into bed.  She remained in bed at close of session due to fatigue.  Bed alarm set and call bell in hand.    Therapy Documentation Precautions:  Precautions Precautions: Fall Precaution Comments: seizure precautions Restrictions Weight Bearing Restrictions: No Other Position/Activity Restrictions: head incision   Therapy/Group: Individual Therapy  Carlos Levering 08/07/2020, 7:38 AM

## 2020-08-07 NOTE — Progress Notes (Signed)
Amy [name] PHYSICAL MEDICINE & REHABILITATION PROGRESS NOTE  Subjective/Complaints: Patient seen sitting up in bed this morning.  She states she slept well overnight.  She notes improvement in nausea and vomiting.  She was seen by nephrology yesterday, notes reviewed-no changes.  ROS: Denies CP, SOB, N/V/D  Objective: Vital Signs: Blood pressure 101/69, pulse 100, temperature 97.7 F (36.5 C), temperature source Oral, resp. rate 17, height 5\' 4"  (1.626 m), weight 53.3 kg, SpO2 100 %. No results found. No results for input(s): WBC, HGB, HCT, PLT in the last 72 hours. Recent Labs    08/05/20 0720  NA 130*  K 4.0  CL 97*  CO2 23  GLUCOSE 102*  BUN 26*  CREATININE 2.85*  CALCIUM 8.1*    Intake/Output Summary (Last 24 hours) at 08/07/2020 1303 Last data filed at 08/06/2020 1910 Gross per 24 hour  Intake 120 ml  Output 2 ml  Net 118 ml        Physical Exam: BP 101/69 (BP Location: Left Arm)   Pulse 100   Temp 97.7 F (36.5 C) (Oral)   Resp 17   Ht 5\' 4"  (1.626 m)   Wt 53.3 kg   SpO2 100%   BMI 20.17 kg/m  Constitutional: No distress . Vital signs reviewed. HENT:  Left [place] site with staples  No facial edema Eyes: EOMI. No discharge. Cardiovascular: No JVD.  RRR. Respiratory: Normal effort.  No stridor.  Bilateral clear to auscultation. GI: Non-distended.  BS +.  + PD site. Skin: Warm and dry.  Intact. Psych: Flat.  Normal behavior. Musc: Generalized edema.  No tenderness in extremities. Neuro: Alert Motor: LUE: 4-4+/5 proximal distal, stable RUE: 4 --4/5 proximal distal, stable LLE: Hip flexion 2/5, knee extension 2/5, ankle dorsiflexion 3/5 RLE: Hip flexion 2/5, knee extension 2/5, ankle dorsiflexion 3/5  Assessment/Plan: 1. Functional deficits which require 3+ hours per day of interdisciplinary therapy in a comprehensive inpatient rehab setting.  Physiatrist is providing close team supervision and 24 hour management of active medical problems listed  below.  Physiatrist and rehab team continue to assess barriers to discharge/monitor patient progress toward functional and medical goals   Care Tool:  Bathing    Body parts bathed by patient: Right arm,Left arm,Chest,Abdomen,Front perineal area,Face   Body parts bathed by helper: Right arm,Left arm,Front perineal area,Buttocks,Right upper leg,Left upper leg,Right lower leg,Left lower leg     Bathing assist Assist Level: Maximal Assistance - Patient 24 - 49%     Upper Body Dressing/Undressing Upper body dressing   What is the patient wearing?: Pull over shirt    Upper body assist Assist Level: Maximal Assistance - Patient 25 - 49%    Lower Body Dressing/Undressing Lower body dressing      What is the patient wearing?: Incontinence brief     Lower body assist Assist for lower body dressing: Total Assistance - Patient < 25%     Toileting Toileting    Toileting assist Assist for toileting: Total Assistance - Patient < 25%     Transfers Chair/bed transfer  Transfers assist     Chair/bed transfer assist level: Maximal Assistance - Patient 25 - 49% Chair/bed transfer assistive device: Armrests,Bedrails   Locomotion Ambulation   Ambulation assist   Ambulation activity did not occur: Safety/medical concerns          Walk 10 feet activity   Assist  Walk 10 feet activity did not occur: Safety/medical concerns        Walk 50 feet  activity   Assist Walk 50 feet with 2 turns activity did not occur: Safety/medical concerns         Walk 150 feet activity   Assist Walk 150 feet activity did not occur: Safety/medical concerns         Walk 10 feet on uneven surface  activity   Assist Walk 10 feet on uneven surfaces activity did not occur: Safety/medical concerns         Wheelchair     Assist Will patient use wheelchair at discharge?: Yes Type of Wheelchair: Manual    Wheelchair assist level: Minimal Assistance - Patient > 75% Max  wheelchair distance: 49'    Wheelchair 50 feet with 2 turns activity    Assist    Wheelchair 50 feet with 2 turns activity did not occur: Safety/medical concerns       Wheelchair 150 feet activity     Assist  Wheelchair 150 feet activity did not occur: Safety/medical concerns        Medical Problem List and Plan: 1.  Right hemiparesis and aphasia secondary to large left SDH s/p craniotomy  Continue CIR 2.  Antithrombotics: -DVT/anticoagulation:  Mechanical: Sequential compression devices, below knee Bilateral lower extremities             -antiplatelet therapy: N/A due to bleed.  3. Chronic migraines/ Pain Management: Uses Fioricet 3-4 times a day on average.   Controlled with meds on 4/7  Monitor with increased exertion 4. Mood: LCSW to follow for evaluation and support.              -antipsychotic agents: N/A 5. Neuropsych: This patient is not fully capable of making decisions on her own behalf. 6. Skin/Wound Care: Monitor wound for healing.             --offer supplements. Family to provide meals from home as able.  7. Fluids/Electrolytes/Nutrition: Continue PD--does not need fluid restriction.              --has had chronic issues with variable intake as well as N/V likely due to gastroparesis. 8. Chronic Hypotension:  TEDs when out of bed             --encourage intake. 9.  ESRD on PD: Nephrology to assist/manage PD.  10.  Recurrent Seizures: Has been seizure free on Brivaracetam and Lacosamide bid.              --Lacosamide was decreased on 03/31 to avoid sedative SE.  No breakthrough seizures since admission-4/7 11. CAD/NSTEMI s/p PCI: Treated medically.   Off DAPT due to bleed             --Continue Lipitor.   Metoprolol low-dose resumed  12. Acute on chronic anemia:   Hemoglobin 14.9 on 3/25, labs ordered for tomorrow  Continue to monitor 13. H/o Depression: On Celexa 14. Sick Euthyroid: Will need follow up as illness resolves.              --TSH-  5.80/Free T4-1.2 (03/27) 15. Gastroparesis w/chronic N/V/anorexia: Has been ongoing per records review.              --question failure of PD (husband reports no N/V with brief episode of HD in the past)  Reglan 5 3 times daily  ?  Improving 16.  Hyponatremia             --chronic hyponatremia with Na-130-133 range  Sodium 130 on 4/5             --  Sodium bicarb to manage metabolic acidosis. 17.  Severe hypoalbuminemia  Supplement initiated on 4/5 18.  Prolonged QTC  ECG reviewed, improved since last visit  Compazine and dose/frequency decreased  LOS: 3 days A FACE TO FACE EVALUATION WAS PERFORMED  Amy [name] Lorie Phenix 08/07/2020, 1:03 PM

## 2020-08-07 NOTE — Progress Notes (Addendum)
Physical Therapy Session Note  Patient Details  Name: Amy [name] MRN: [id number] Date of Birth: 06-26-1950  Today's Date: 08/07/2020 PT Individual Time: 0160-1093 PT Individual Time Calculation (min): 57 min   Short Term Goals: Week 1:  PT Short Term Goal 1 (Week 1): Patient to perform bed mobility with mod A. PT Short Term Goal 2 (Week 1): Patient to perform sit to stand with mod A. PT Short Term Goal 3 (Week 1): Bed<>chair transfers with mod A. PT Short Term Goal 4 (Week 1): Patient to initiate ambulation.  Skilled Therapeutic Interventions/Progress Updates:    Patient in supine and able to recall lunch meal and that she has not gotten sick today.  Doesn't feel great, however.  Patient performed supine therex consisting of AP's, heel slides with A, hip abduction A on R, SAQ 2 x 10 with 1.5 # cuff weights, hip adductor squeezes all x 10.  Patient supine to sit with HOB up with mod A, cues and increased time.  Seated EOB with close S to CGA for balance.  Performed sit to stand to [employer] x 2 with max A.  Moved up in bed with transfer device as pt not interested in [facility], requesting to use bed pan.  Patient sit to supine with LE assist mod A and initiated doffing pants, completed with mod A and rolled to L min A and placed bed pan.  Total A for hygiene and completing donning brief.  Removed pants and ace wraps.  Supine to sit mod A and performed sitting UE therex with 2# bar: bicep curls, chest press and shoulder elevation to shoulder level all x 10 for core strength and sitting balance.  Patient scooting up to [place] seated on EOB mod A and sit to supine with mod A.  Patient left with call bell in reach and bed alarm active.   Therapy Documentation Precautions:  Precautions Precautions: Fall Precaution Comments: seizure precautions Restrictions Weight Bearing Restrictions: No Other Position/Activity Restrictions: head incision General:   Vital Signs:   Pain: Pain Assessment Pain Scale:  Faces Pain Score: 0-No pain Faces Pain Scale: Hurts even more Pain Type: Chronic pain Pain Location: Head Pain Descriptors / Indicators: Headache Pain Onset: On-going Pain Intervention(s): Repositioned;Distraction Mobility:   Locomotion :    Trunk/Postural Assessment :    Balance:   Exercises:   Other Treatments:      Therapy/Group: Individual Therapy  Reginia Naas 08/07/2020, 2:55 PM

## 2020-08-08 DIAGNOSIS — R112 Nausea with vomiting, unspecified: Secondary | ICD-10-CM

## 2020-08-08 DIAGNOSIS — G8918 Other acute postprocedural pain: Secondary | ICD-10-CM | POA: Diagnosis not present

## 2020-08-08 DIAGNOSIS — R9431 Abnormal electrocardiogram [ECG] [EKG]: Secondary | ICD-10-CM | POA: Diagnosis not present

## 2020-08-08 DIAGNOSIS — S065X9A Traumatic subdural hemorrhage with loss of consciousness of unspecified duration, initial encounter: Secondary | ICD-10-CM

## 2020-08-08 DIAGNOSIS — D649 Anemia, unspecified: Secondary | ICD-10-CM | POA: Diagnosis not present

## 2020-08-08 LAB — CBC WITH DIFFERENTIAL/PLATELET
Abs Immature Granulocytes: 0.06 10*3/uL (ref 0.00–0.07)
Basophils Absolute: 0.1 10*3/uL (ref 0.0–0.1)
Basophils Relative: 1 %
Eosinophils Absolute: 0.5 10*3/uL (ref 0.0–0.5)
Eosinophils Relative: 5 %
HCT: 30.4 % — ABNORMAL LOW (ref 36.0–46.0)
Hemoglobin: 10 g/dL — ABNORMAL LOW (ref 12.0–15.0)
Immature Granulocytes: 1 %
Lymphocytes Relative: 10 %
Lymphs Abs: 1 10*3/uL (ref 0.7–4.0)
MCH: 32.1 pg (ref 26.0–34.0)
MCHC: 32.9 g/dL (ref 30.0–36.0)
MCV: 97.4 fL (ref 80.0–100.0)
Monocytes Absolute: 0.8 10*3/uL (ref 0.1–1.0)
Monocytes Relative: 8 %
Neutro Abs: 7.7 10*3/uL (ref 1.7–7.7)
Neutrophils Relative %: 75 %
Platelets: 336 10*3/uL (ref 150–400)
RBC: 3.12 MIL/uL — ABNORMAL LOW (ref 3.87–5.11)
RDW: 16.4 % — ABNORMAL HIGH (ref 11.5–15.5)
WBC: 10 10*3/uL (ref 4.0–10.5)
nRBC: 0 % (ref 0.0–0.2)

## 2020-08-08 MED ORDER — DELFLEX-LC/2.5% DEXTROSE 394 MOSM/L IP SOLN: INTRAPERITONEAL | Status: DC

## 2020-08-08 MED ORDER — DELFLEX-LC/1.5% DEXTROSE 344 MOSM/L IP SOLN: INTRAPERITONEAL | Status: DC

## 2020-08-08 MED ORDER — DOCUSATE SODIUM 100 MG PO CAPS
100.0000 mg | ORAL_CAPSULE | Freq: Two times a day (BID) | ORAL | Status: DC
  Administered 2020-08-08 – 2020-09-15 (×73): 100 mg via ORAL
  Filled 2020-08-08 (×76): qty 1

## 2020-08-08 NOTE — Progress Notes (Signed)
Vimpat dose administered this am, no adverse side effects, PA aware.

## 2020-08-08 NOTE — Progress Notes (Addendum)
[facility] Progress Note  Subjective: UF [age] yest. No c/o. Having n/v but denies any nausea at the sight or smell of food, says she has a good appetite.  D/w husband briefly about possiblity of trying hemodialysis, he is not interested , doesn't think she would tolerate.  I have discussed the same thing w/ him on a prior admission and he declined then.   Vitals:   08/07/20 2003 08/08/20 0519 08/08/20 0711 08/08/20 1452  BP: 120/72 120/69 128/72 104/60  Pulse: 84 79  76  Resp: 18 18 18 17   Temp: (!) 97.5 F (36.4 C) 98.1 F (36.7 C) 97.6 F (36.4 C) 97.6 F (36.4 C)  TempSrc: Oral Oral Oral   SpO2: 96% 99% 100% 100%  Weight:  55.1 kg    Height:        Exam:  alert, nad   no jvd  Chest cta bilat  Cor reg no RG  Abd soft ntnd no ascites +PD cath   Ext 2+ diffuse LE / hip edema bilat, slightly better   Alert, NF, ox3, gen weakness, deconditioned    BUN 26  Cr 2.85  Alb 1.1  Dialysis Orders: Home PDin [facility] followed by Dr. Candiss Norse. 4 exchanges overnight, 2000 cc and 1.5 hrs each.   Assessment/Plan: 1. Debility: 2/2 Multiple med issues mainly now SDH recovery, also recent nstemi w/ CAD and low EF.  Readmitted to CIR on 08/04/20.  2. Nausea/ vomiting - not sure cause, uremia possible. Will try extra session overnight (4x / night > 5x/ night).  3. Fall w/ subdural hematoma - sp SDH evacuation at [facility] on 07/24/20 4. Vol overload - sig LE dependent edema and UE edema. Dry wt probably in mid 40's (kgs). Cont to gradually move volume down. Using 1.5% and 2.5% fluids half and half.  5. ESRD -[facility] in [place], [place], f/b Dr Candiss Norse of [place] [facility]. Good solute control per labs. Cont PD nightly. ^ to 5 dwells d/t n/v.  6. Multivessel CAD-  with reduced EF 25-30% by recent cath in Feb 2022. Per TCTS was not CABG candidate. UnderwentPCI to LAD. At [facility] repeat echo per family showed improved EF 55%.  7. Chronichyponatremia - did not improve last admit w/ extra volume  removal. If gets worse consider fluid restriction +/-  NaCl tabs.  8. Metabolic acidosis - getting bid sod bicarb 9. HTN/volume - continues on metoprolol xl 25 qd 10. Anemiaof ESRD- was getting Aranesp 100 MCG q Sat while here last admit. Awaiting admit labs. Last tsat 91%on 06/20/20. Ferritin was 1131.  9. Nutrition: check albumin. Renal diet.  10. Seizures - per primary team 11. H/o C diff - rx'd in Feb 2022     Rob Karina Lenderman 08/08/2020, 3:22 PM   Recent Labs  Lab 08/05/20 0720 08/08/20 0512  K 4.0  --   BUN 26*  --   CREATININE 2.85*  --   CALCIUM 8.1*  --   HGB  --  10.0*   Inpatient medications: . aspirin EC  81 mg Oral Daily  . atorvastatin  40 mg Oral Daily  . brivaracetam  75 mg Oral BID  . butalbital-acetaminophen-caffeine  1 tablet Oral TID with meals  . citalopram  10 mg Oral Daily  . docusate sodium  100 mg Oral BID  . gentamicin cream  1 application Topical Daily  . heparin  5,000 Units Subcutaneous Q12H  . kidney failure book   Does not apply Once  . lacosamide  75 mg Oral BID  . melatonin  5 mg Oral QHS  . metoCLOPramide  5 mg Oral TID AC  . metoprolol tartrate  6.25 mg Oral BID  . pantoprazole  40 mg Oral Daily  . sodium bicarbonate  650 mg Oral BID  . triamcinolone cream   Topical TID   . dialysis solution 1.5% low-MG/low-CA    . dialysis solution 2.5% low-MG/low-CA     acetaminophen, alum & mag hydroxide-simeth, bisacodyl, camphor-menthol, guaiFENesin-dextromethorphan, dianeal solution for CAPD/[name] with heparin, dianeal solution for CAPD/[name] with heparin, melatonin, methocarbamol, polyethylene glycol, prochlorperazine **OR** prochlorperazine **OR** prochlorperazine, sodium phosphate, traZODone

## 2020-08-08 NOTE — Progress Notes (Signed)
Physical Therapy Session Note  Patient Details  Name: Amy [name] MRN: [id number] Date of Birth: 01-10-1951  Today's Date: 08/08/2020 PT Individual Time: 1100-1200 PT Individual Time Calculation (min): 60 min   Short Term Goals: Week 1:  PT Short Term Goal 1 (Week 1): Patient to perform bed mobility with mod A. PT Short Term Goal 2 (Week 1): Patient to perform sit to stand with mod A. PT Short Term Goal 3 (Week 1): Bed<>chair transfers with mod A. PT Short Term Goal 4 (Week 1): Patient to initiate ambulation.  Skilled Therapeutic Interventions/Progress Updates:    Patient in supine and reports she did get sick earlier today, but had some nausea medication.  Patient requesting bed pan to urinate.  Assist to loosen brief and pt rolled with mod A for placement of bed pan.  Patient toileted and total A for hygiene and replacing brief.  Assist to place Ace wraps bilateral lower legs for BP management.  Performed lower body dressing with max A rolling in bed.  Patient supine to sit with mod to max A from R sidelying.  Noted R lateral lean so assist to adjust hips and L weight shift initially, then able to sit EOB with S.  Patient doffed gown and donned l/s shirt with mod A.  Applied kerlix to R elbow due to weeping with edema (RN aware).  Patient transferred to w/c via squat pivot with mod to max A.  Propelled in w/c x 40' with min A and increased time mod cues.  Patient assisted in w/c to gym and performed sit to stand x 3 in parallel bars with max A.  Able to stand erect and progressively longer up to about 30 seconds.  Patient assisted in w/c back to room and transferred w/c to recliner via [place] with max to total A for sit to stand to [place].  Patient adjusted for comfort and encouraged to sit up at least 30 minutes and NT made aware.  Patient with call bell and needs in reach.   Therapy Documentation Precautions:  Precautions Precautions: Fall Precaution Comments: seizure  precautions Restrictions Weight Bearing Restrictions: No Other Position/Activity Restrictions: head incision Pain: Pain Assessment Faces Pain Scale: Hurts little more Pain Type: Chronic pain Pain Location: Head Pain Descriptors / Indicators: Headache Pain Onset: On-going Pain Intervention(s): Distraction   Therapy/Group: Individual Therapy  Reginia Naas  Magda Kiel, PT 08/08/2020, 12:42 PM

## 2020-08-08 NOTE — Progress Notes (Signed)
Speech Language Pathology TBI Note  Patient Details  Name: Amy [name] MRN: [id number] Date of Birth: [birth year]  Today's Date: 08/08/2020 SLP Individual Time: 0930-1025 SLP Individual Time Calculation (min): 55 min  Short Term Goals: Week 1: SLP Short Term Goal 1 (Week 1): Pt will demonstrate sustained attention in functional tasks in 10 minue intervals with min A verbal cues for redirection. SLP Short Term Goal 2 (Week 1): Pt will demonstrate mildly complex problem solving skills in functional tasks with min A verbal cues. SLP Short Term Goal 3 (Week 1): Pt will demonstrate self-monitor and self-correction in functional errors with min A verbal cues. SLP Short Term Goal 4 (Week 1): Pt will demonstrate recall of daily, novel information with supervision A verbal cues for visual aids.  Skilled Therapeutic Interventions: Skilled treatment session focused on cognitive goals. Upon arrival, patient reported she had just recently vomited and requested medicine for nausea. Patient reports she usually vomits after she eats, therefore, verbally problem solved different foods that could be leading to not feeling well as well as some compensatory strategies to utilize. She verbalized understanding. RN came in to administer medications. Patient appeared familiar with all medications and independently asked about a missing medication. Patient recalled new, daily information with overall supervision level verbal cues but did appear to demonstrate intermittent word-finding deficits. Patient left upright in bed with alarm on and all needs within reach. Continue with current plan of care.      Pain Pain Assessment Faces Pain Scale: Hurts little more Pain Type: Chronic pain Pain Location: Head Pain Descriptors / Indicators: Headache Pain Onset: On-going Pain Intervention(s): Distraction  Agitated Behavior Scale: TBI      Therapy/Group: Individual Therapy  Faithanne Verret, [place] 08/08/2020, 3:24 PM

## 2020-08-08 NOTE — Progress Notes (Signed)
[place] PHYSICAL MEDICINE & REHABILITATION PROGRESS NOTE  Subjective/Complaints: Patient seen sitting up in bed this morning.  She states she slept well overnight.  She states she had an episode of nausea and vomiting this a.m.  Husband at bedside, I discussed with husband as well plan for nausea/vomiting.  She was seen by nephro yesterday, notes reviewed-no changes.  ROS: + Nausea/vomiting.  Denies CP, SOB, diarrhea  Objective: Vital Signs: Blood pressure 128/72, pulse 79, temperature 97.6 F (36.4 C), temperature source Oral, resp. rate 18, height 5\' 4"  (1.626 m), weight 55.1 kg, SpO2 100 %. No results found. Recent Labs    08/08/20 0512  WBC 10.0  HGB 10.0*  HCT 30.4*  PLT 336   No results for input(s): NA, K, CL, CO2, GLUCOSE, BUN, CREATININE, CALCIUM in the last 72 hours.  Intake/Output Summary (Last 24 hours) at 08/08/2020 1044 Last data filed at 08/07/2020 1850 Gross per 24 hour  Intake 120 ml  Output --  Net 120 ml        Physical Exam: BP 128/72 (BP Location: Right Arm)   Pulse 79   Temp 97.6 F (36.4 C) (Oral)   Resp 18   Ht 5\' 4"  (1.626 m)   Wt 55.1 kg   SpO2 100%   BMI 20.85 kg/m  Constitutional: No distress . Vital signs reviewed. HENT: Normocephalic.   Left [place] site with staples Left facial edema Eyes: EOMI. No discharge. Cardiovascular: No JVD.  RRR. Respiratory: Normal effort.  No stridor.  Bilateral clear to auscultation. GI: Non-distended.  BS +.  + PD site. Skin: Warm and dry.  Intact. Psych: Flat.  Normal behavior. Musc: Generalized edema.  No tenderness in extremities. Neuro: Alert Motor: LUE: 4-4+/5 proximal distal, unchanged RUE: 4 --4/5 proximal distal, stable LLE: Hip flexion 2/5, knee extension 2/5, ankle dorsiflexion 3/5, improving RLE: Hip flexion 2/5, knee extension 2/5, ankle dorsiflexion 3/5, improving  Assessment/Plan: 1. Functional deficits which require 3+ hours per day of interdisciplinary therapy in a comprehensive  inpatient rehab setting.  Physiatrist is providing close team supervision and 24 hour management of active medical problems listed below.  Physiatrist and rehab team continue to assess barriers to discharge/monitor patient progress toward functional and medical goals   Care Tool:  Bathing    Body parts bathed by patient: Right arm,Left arm,Chest,Abdomen,Front perineal area,Face,Right upper leg,Left upper leg,Buttocks   Body parts bathed by helper: Buttocks,Right upper leg,Left upper leg,Right lower leg,Left lower leg     Bathing assist Assist Level: Moderate Assistance - Patient 50 - 74%     Upper Body Dressing/Undressing Upper body dressing   What is the patient wearing?: Pull over shirt    Upper body assist Assist Level: Minimal Assistance - Patient > 75%    Lower Body Dressing/Undressing Lower body dressing      What is the patient wearing?: Incontinence brief,Pants     Lower body assist Assist for lower body dressing: Maximal Assistance - Patient 25 - 49%     Toileting Toileting    Toileting assist Assist for toileting: Maximal Assistance - Patient 25 - 49%     Transfers Chair/bed transfer  Transfers assist     Chair/bed transfer assist level: Dependent - mechanical lift (Stedy) Chair/bed transfer assistive device: Armrests,Bedrails   Locomotion Ambulation   Ambulation assist   Ambulation activity did not occur: Safety/medical concerns          Walk 10 feet activity   Assist  Walk 10 feet activity did  not occur: Safety/medical concerns        Walk 50 feet activity   Assist Walk 50 feet with 2 turns activity did not occur: Safety/medical concerns         Walk 150 feet activity   Assist Walk 150 feet activity did not occur: Safety/medical concerns         Walk 10 feet on uneven surface  activity   Assist Walk 10 feet on uneven surfaces activity did not occur: Safety/medical concerns         Wheelchair     Assist  Will patient use wheelchair at discharge?: Yes Type of Wheelchair: Manual    Wheelchair assist level: Minimal Assistance - Patient > 75% Max wheelchair distance: 30'    Wheelchair 50 feet with 2 turns activity    Assist    Wheelchair 50 feet with 2 turns activity did not occur: Safety/medical concerns       Wheelchair 150 feet activity     Assist  Wheelchair 150 feet activity did not occur: Safety/medical concerns        Medical Problem List and Plan: 1.  Right hemiparesis and aphasia secondary to large left SDH s/p craniotomy  Continue CIR 2.  Antithrombotics: -DVT/anticoagulation:  Mechanical: Sequential compression devices, below knee Bilateral lower extremities             -antiplatelet therapy: N/A due to bleed.  3. Chronic migraines/ Pain Management: Uses Fioricet 3-4 times a day on average.   Controlled with meds on 4/8  Monitor with increased exertion 4. Mood: LCSW to follow for evaluation and support.              -antipsychotic agents: N/A 5. Neuropsych: This patient is not fully capable of making decisions on her own behalf. 6. Skin/Wound Care: Monitor wound for healing.             --offer supplements. Family to provide meals from home as able.  7. Fluids/Electrolytes/Nutrition: Continue PD--does not need fluid restriction.              --has had chronic issues with variable intake as well as N/V likely due to gastroparesis. 8. Chronic Hypotension:  TEDs when out of bed             --encourage intake. 9.  ESRD on PD: Nephrology to assist/manage PD.  10.  Recurrent Seizures: Has been seizure free on Brivaracetam and Lacosamide bid.              --Lacosamide was decreased on 03/31 to avoid sedative SE.  No breakthrough seizures since admission-4/8 11. CAD/NSTEMI s/p PCI: Treated medically.   Off DAPT due to bleed             --Continue Lipitor.   Metoprolol low-dose resumed  12. Acute on chronic anemia:   Hemoglobin 10.0 on 4/8, labs ordered for  Monday  Continue to monitor 13. H/o Depression: On Celexa 14. Sick Euthyroid: Will need follow up as illness resolves.              --TSH- 5.80/Free T4-1.2 (03/27) 15. Gastroparesis w/chronic N/V/anorexia: Has been ongoing per records review.              --question failure of PD (husband reports no N/V with brief episode of HD in the past)  Reglan 5 3 times daily  Hesitant to continue increase medications due to #18  Will discuss with nephrology 16.  Hyponatremia             --  chronic hyponatremia with Na-130-133 range  Sodium 130 on 4/5             --Sodium bicarb to manage metabolic acidosis. 17.  Severe hypoalbuminemia  Supplement initiated on 4/5, supplement DC'd due to concern for association with nausea/vomiting, however does not appear to have made significant difference 18.  Prolonged QTC  ECG reviewed, improved since last visit  Compazine and dose/frequency decreased    LOS: 4 days A FACE TO FACE EVALUATION WAS PERFORMED  Brannon Decaire Lorie Phenix 08/08/2020, 10:44 AM

## 2020-08-08 NOTE — Progress Notes (Signed)
Occupational Therapy TBI Note  Patient Details  Name: Amy [name] MRN: [id number] Date of Birth: April 04, 1951  Today's Date: 08/08/2020 OT Individual Time: 1500-1600 OT Individual Time Calculation (min): 60 min    Short Term Goals: Week 1:  OT Short Term Goal 1 (Week 1): patient will complete LB adl bed level with mod A OT Short Term Goal 2 (Week 1): Patient will complete UB adl w/c level with min A OT Short Term Goal 3 (Week 1): Patient will complete bed mobility with mod A OT Short Term Goal 4 (Week 1): patient will complete functional sit or stand pivot transfer with mod A  Skilled Therapeutic Interventions/Progress Updates:    1;1. Pt received in bed agreeable to OT. Pt reporting being nauseas prior in the day. Pt reporting need to toilet. Pt requires max A to sit EOB and scoot with facilitation for anterior weight shift to improve scooting to EOB. Pt with mild light headedness at EOB 2/10. Pt completes sit to stand in stedy with MAX A from EOB and +2 to manage pants prior to toileting. Pt able to void bowel and bladder. Pt requries +2 A for hygeine. Pt with poor initiation and calibration requiring VC for upright posture and sequencing. Pt practices bed mobility, bridging and rolling from hooklying. Pt able to complete with MIN A with flat bed and bed rails. Pt completes 1x hooklying>sitting EOB with MOD-MAX A overallbut improved from beginning of session. Pt requires significant time to motor plan all actions and process commands. Exited session with pt seated in bed, exit alarm on and call light in reach   Therapy Documentation Precautions:  Precautions Precautions: Fall Precaution Comments: seizure precautions Restrictions Weight Bearing Restrictions: No Other Position/Activity Restrictions: head incision General:   Vital Signs: Therapy Vitals Temp: 97.6 F (36.4 C) Pulse Rate: 76 Resp: 17 BP: 104/60 Patient Position (if appropriate): Lying Oxygen Therapy SpO2: 100 % O2  Device: Room Air Pain: Pain Assessment Faces Pain Scale: Hurts little more Pain Type: Chronic pain Pain Location: Head Pain Descriptors / Indicators: Headache Pain Onset: On-going Pain Intervention(s): Distraction Agitated Behavior Scale: TBI  Observation Details Observation Environment: pt room Start of observation period - Date: 08/08/20 Start of observation period - Time: 1500 End of observation period - Date: 08/08/20 End of observation period - Time: 1554 Agitated Behavior Scale (DO NOT LEAVE BLANKS) Short attention span, easy distractibility, inability to concentrate: Present to a moderate degree Impulsive, impatient, low tolerance for pain or frustration: Absent Uncooperative, resistant to care, demanding: Absent Violent and/or threatening violence toward people or property: Absent Explosive and/or unpredictable anger: Absent Rocking, rubbing, moaning, or other self-stimulating behavior: Absent Pulling at tubes, restraints, etc.: Absent Wandering from treatment areas: Absent Restlessness, pacing, excessive movement: Absent Repetitive behaviors, motor, and/or verbal: Absent Rapid, loud, or excessive talking: Absent Sudden changes of mood: Absent Easily initiated or excessive crying and/or laughter: Absent Self-abusiveness, physical and/or verbal: Absent Agitated behavior scale total score: 16  ADL: ADL Eating: Supervision/safety,Set up Where Assessed-Eating: Bed level Grooming: Minimal assistance Where Assessed-Grooming: Bed level Upper Body Bathing: Moderate assistance Where Assessed-Upper Body Bathing: Bed level Lower Body Bathing: Dependent Where Assessed-Lower Body Bathing: Bed level Upper Body Dressing: Maximal assistance Where Assessed-Upper Body Dressing: Bed level Lower Body Dressing: Dependent Where Assessed-Lower Body Dressing: Bed level Toileting: Dependent Where Assessed-Toileting: Bed level Vision   Perception    Praxis   Exercises:   Other  Treatments:     Therapy/Group: Individual Therapy  Tonny Branch  08/08/2020, 3:54 PM

## 2020-08-09 DIAGNOSIS — S065X9A Traumatic subdural hemorrhage with loss of consciousness of unspecified duration, initial encounter: Secondary | ICD-10-CM | POA: Diagnosis not present

## 2020-08-09 NOTE — Progress Notes (Signed)
[place] PHYSICAL MEDICINE & REHABILITATION PROGRESS NOTE  Subjective/Complaints:  Still nauseated but no longer having any vomiting.  Thinks she is having some therapy in early afternoon.  ROS: + Nausea , -vomiting.  Denies CP, SOB, diarrhea  Objective: Vital Signs: Blood pressure 110/77, pulse 86, temperature 98.7 F (37.1 C), temperature source Oral, resp. rate 18, height 5\' 4"  (1.626 m), weight 55.6 kg, SpO2 100 %. No results found. Recent Labs    08/08/20 0512  WBC 10.0  HGB 10.0*  HCT 30.4*  PLT 336   No results for input(s): NA, K, CL, CO2, GLUCOSE, BUN, CREATININE, CALCIUM in the last 72 hours.  Intake/Output Summary (Last 24 hours) at 08/09/2020 1145 Last data filed at 08/09/2020 0800 Gross per 24 hour  Intake 340 ml  Output 1 ml  Net 339 ml        Physical Exam: BP 110/77 (BP Location: Right Arm)   Pulse 86   Temp 98.7 F (37.1 C) (Oral)   Resp 18   Ht 5\' 4"  (1.626 m)   Wt 55.6 kg   SpO2 100%   BMI 21.04 kg/m   General: No acute distress Mood and affect are appropriate Heart: Regular rate and rhythm no rubs murmurs or extra sounds Lungs: Clear to auscultation, breathing unlabored, no rales or wheezes Abdomen: Positive bowel sounds, soft nontender to palpation, nondistended Extremities: No clubbing, cyanosis, or edema Skin: No evidence of breakdown, no evidence of rash  Neuro: Alert Motor: LUE: 4-4+/5 proximal distal, unchanged RUE: 4 --4/5 proximal distal, stable LLE: Hip flexion 2/5, knee extension 2/5, ankle dorsiflexion 3/5, improving RLE: Hip flexion 2/5, knee extension 2/5, ankle dorsiflexion 3/5, improving  Assessment/Plan: 1. Functional deficits which require 3+ hours per day of interdisciplinary therapy in a comprehensive inpatient rehab setting.  Physiatrist is providing close team supervision and 24 hour management of active medical problems listed below.  Physiatrist and rehab team continue to assess barriers to discharge/monitor  patient progress toward functional and medical goals   Care Tool:  Bathing    Body parts bathed by patient: Right arm,Left arm,Chest,Abdomen,Front perineal area,Face,Right upper leg,Left upper leg,Buttocks   Body parts bathed by helper: Buttocks,Right upper leg,Left upper leg,Right lower leg,Left lower leg     Bathing assist Assist Level: Moderate Assistance - Patient 50 - 74%     Upper Body Dressing/Undressing Upper body dressing   What is the patient wearing?: Pull over shirt    Upper body assist Assist Level: Minimal Assistance - Patient > 75%    Lower Body Dressing/Undressing Lower body dressing      What is the patient wearing?: Incontinence brief,Pants     Lower body assist Assist for lower body dressing: Maximal Assistance - Patient 25 - 49%     Toileting Toileting    Toileting assist Assist for toileting: Maximal Assistance - Patient 25 - 49%     Transfers Chair/bed transfer  Transfers assist     Chair/bed transfer assist level: Dependent - mechanical lift (Stedy) Chair/bed transfer assistive device: Armrests,Bedrails   Locomotion Ambulation   Ambulation assist   Ambulation activity did not occur: Safety/medical concerns          Walk 10 feet activity   Assist  Walk 10 feet activity did not occur: Safety/medical concerns        Walk 50 feet activity   Assist Walk 50 feet with 2 turns activity did not occur: Safety/medical concerns         Walk 150  feet activity   Assist Walk 150 feet activity did not occur: Safety/medical concerns         Walk 10 feet on uneven surface  activity   Assist Walk 10 feet on uneven surfaces activity did not occur: Safety/medical concerns         Wheelchair     Assist Will patient use wheelchair at discharge?: Yes Type of Wheelchair: Manual    Wheelchair assist level: Minimal Assistance - Patient > 75% Max wheelchair distance: 69'    Wheelchair 50 feet with 2 turns  activity    Assist    Wheelchair 50 feet with 2 turns activity did not occur: Safety/medical concerns       Wheelchair 150 feet activity     Assist  Wheelchair 150 feet activity did not occur: Safety/medical concerns        Medical Problem List and Plan: 1.  Right hemiparesis and aphasia secondary to large left SDH s/p craniotomy  Continue CIR 2.  Antithrombotics: -DVT/anticoagulation:  Mechanical: Sequential compression devices, below knee Bilateral lower extremities             -antiplatelet therapy: N/A due to bleed.  3. Chronic migraines/ Pain Management: Uses Fioricet 3-4 times a day on average.   Controlled with meds on 4/9  Monitor with increased exertion 4. Mood: LCSW to follow for evaluation and support.              -antipsychotic agents: N/A 5. Neuropsych: This patient is not fully capable of making decisions on her own behalf. 6. Skin/Wound Care: Monitor wound for healing.             --offer supplements. Family to provide meals from home as able.  7. Fluids/Electrolytes/Nutrition: Continue PD--does not need fluid restriction.              --has had chronic issues with variable intake as well as N/V likely due to gastroparesis. 8. Chronic Hypotension:  TEDs when out of bed             Vitals:   08/08/20 2016 08/09/20 0329  BP: 111/64 110/77  Pulse: 80 86  Resp: 18 18  Temp: 98 F (36.7 C) 98.7 F (37.1 C)  SpO2: 100% 100%  Normotensive on 4/9 9.  ESRD on PD: Nephrology to assist/manage PD.  10.  Recurrent Seizures: Has been seizure free on Brivaracetam and Lacosamide bid.              --Lacosamide was decreased on 03/31 to avoid sedative SE.  No breakthrough seizures since admission-4/8 11. CAD/NSTEMI s/p PCI: Treated medically.   Off DAPT due to bleed             --Continue Lipitor.   Metoprolol low-dose resumed  12. Acute on chronic anemia:   Hemoglobin 10.0 on 4/8, labs ordered for Monday  Continue to monitor 13. H/o Depression: On  Celexa 14. Sick Euthyroid: Will need follow up as illness resolves.              --TSH- 5.80/Free T4-1.2 (03/27) 15. Gastroparesis w/chronic N/V/anorexia: Has been ongoing per records review.              --question failure of PD (husband reports no N/V with brief episode of HD in the past)  Reglan 5 3 times daily, vomiting resolved still has some nausea  Hesitant to continue increase medications due to #18  Will discuss with nephrology 16.  Hyponatremia             --  chronic hyponatremia with Na-130-133 range  Sodium 130 on 4/5             --Sodium bicarb to manage metabolic acidosis. 17.  Severe hypoalbuminemia  Supplement initiated on 4/5, supplement DC'd due to concern for association with nausea/vomiting, however does not appear to have made significant difference 18.  Prolonged QTC  ECG reviewed, improved since last visit  Compazine and dose/frequency decreased    LOS: 5 days A FACE TO FACE EVALUATION WAS PERFORMED  Charlett Blake 08/09/2020, 11:45 AM

## 2020-08-09 NOTE — Progress Notes (Signed)
[facility] Progress Note  Subjective: UF 1.7 L overnight. wts are not down. No nausea today.   Vitals:   08/08/20 1452 08/08/20 2016 08/09/20 0329 08/09/20 0500  BP: 104/60 111/64 110/77   Pulse: 76 80 86   Resp: 17 18 18    Temp: 97.6 F (36.4 C) 98 F (36.7 C) 98.7 F (37.1 C)   TempSrc:  Oral Oral   SpO2: 100% 100% 100%   Weight:    55.6 kg  Height:        Exam:  alert, nad   no jvd  Chest cta bilat  Cor reg no RG  Abd soft ntnd no ascites +PD cath   Ext 2+ diffuse hip edema bilat, improving, lower legs wrapped   Hand edema resolved   Alert, NF, ox3, gen weakness, deconditioned    BUN 26  Cr 2.85  Alb 1.1  Dialysis Orders: [place] followed by Dr. Candiss Norse. 4 exchanges overnight, 2000 cc and 1.5 hrs each.   Assessment/Plan: 1. Debility: 2/2 Multiple med issues mainly now SDH recovery, also recent nstemi w/ CAD and low EF.  Readmitted to CIR on 08/04/20.  2. Fall w/ subdural hematoma - sp SDH evacuation at [facility] on 07/24/20 3. Nausea/ vomiting - not sure cause, uremia unlikely. Better today.   4. Malnutrition/ debility - low albumin, very weak. Have d/w husband on several occassions about a possible trial of HD due to low albumin in a PD patient. He states they are not interested. He feels she would not tolerate HD.  5. Vol overload - sig LE dependent edema and UE edema on admission. Using 1.5% and 2.5% fluids mix w/ nightly UF around 1.5- 2 L. Needs fluid restriction since not losing weight and still edematous, have ordered 1200 cc fluid restriction per day. Check accuracy of wts as well.  6. ESRD -[facility] in [place], [place], f/b Dr Candiss Norse of [place]. Good solute control per labs. Cont PD nightly 4x 2000 cc.   7. Multivessel CAD-  with reduced EF 25-30% by recent cath in Feb 2022. Per TCTS was not CABG candidate. UnderwentPCI to LAD. At [facility] echo per family showed improved EF 55%.  8. Chronichyponatremia - did not improve last admit  w/ extra volume removal. If gets worse consider fluid restriction +/-  NaCl tabs.  9. Metabolic acidosis - getting bid sod bicarb 10. HTN/volume - continues on metoprolol xl 25 qd 11. Anemiaof ESRD- was getting Aranesp 100 MCG q Sat while here last admit. Awaiting admit labs. Last tsat 91%on 06/20/20. Ferritin was 1131.  9. Nutrition: check albumin. Renal diet.  10. Seizures - per primary team 11. H/o C diff - rx'd in Feb 2022     Rob Chick Cousins 08/09/2020, 12:39 PM   Recent Labs  Lab 08/05/20 0720 08/08/20 0512  K 4.0  --   BUN 26*  --   CREATININE 2.85*  --   CALCIUM 8.1*  --   HGB  --  10.0*   Inpatient medications: . aspirin EC  81 mg Oral Daily  . atorvastatin  40 mg Oral Daily  . brivaracetam  75 mg Oral BID  . butalbital-acetaminophen-caffeine  1 tablet Oral TID with meals  . citalopram  10 mg Oral Daily  . docusate sodium  100 mg Oral BID  . gentamicin cream  1 application Topical Daily  . heparin  5,000 Units Subcutaneous Q12H  . kidney failure book   Does not apply Once  .  lacosamide  75 mg Oral BID  . melatonin  5 mg Oral QHS  . metoCLOPramide  5 mg Oral TID AC  . metoprolol tartrate  6.25 mg Oral BID  . pantoprazole  40 mg Oral Daily  . sodium bicarbonate  650 mg Oral BID  . triamcinolone cream   Topical TID   . dialysis solution 1.5% low-MG/low-CA    . dialysis solution 2.5% low-MG/low-CA     acetaminophen, alum & mag hydroxide-simeth, bisacodyl, camphor-menthol, guaiFENesin-dextromethorphan, dianeal solution for CAPD/[name] with heparin, dianeal solution for CAPD/[name] with heparin, melatonin, methocarbamol, polyethylene glycol, prochlorperazine **OR** prochlorperazine **OR** prochlorperazine, sodium phosphate, traZODone

## 2020-08-10 DIAGNOSIS — S065X9A Traumatic subdural hemorrhage with loss of consciousness of unspecified duration, initial encounter: Secondary | ICD-10-CM | POA: Diagnosis not present

## 2020-08-10 MED ORDER — RENA-VITE PO TABS
1.0000 | ORAL_TABLET | Freq: Every day | ORAL | Status: DC
  Administered 2020-08-10 – 2020-09-14 (×36): 1 via ORAL
  Filled 2020-08-10 (×36): qty 1

## 2020-08-10 MED ORDER — DARBEPOETIN ALFA 60 MCG/0.3ML IJ SOSY
60.0000 ug | PREFILLED_SYRINGE | INTRAMUSCULAR | Status: DC
  Filled 2020-08-10: qty 0.3

## 2020-08-10 MED ORDER — NEPRO/CARBSTEADY PO LIQD
237.0000 mL | Freq: Three times a day (TID) | ORAL | Status: DC
  Administered 2020-08-10 – 2020-08-12 (×3): 237 mL via ORAL

## 2020-08-10 NOTE — Progress Notes (Signed)
Occupational Therapy Session Note  Patient Details  Name: Amy [name] MRN: [id number] Date of Birth: 01/12/1951  Today's Date: 08/10/2020 OT Individual Time: 1345-1445 OT Individual Time Calculation (min): 60 min    Short Term Goals: Week 1:  OT Short Term Goal 1 (Week 1): patient will complete LB adl bed level with mod A OT Short Term Goal 2 (Week 1): Patient will complete UB adl w/c level with min A OT Short Term Goal 3 (Week 1): Patient will complete bed mobility with mod A OT Short Term Goal 4 (Week 1): patient will complete functional sit or stand pivot transfer with mod A  Skilled Therapeutic Interventions/Progress Updates:    Patient in bed, c/o nausea, fatigue and HA.  Completed light upper body and lower body AROM/AAROM.  Reviewed edema control measures and positioning.  Utilized bed pan x2 due to fatigue - max A to doff brief, continent of bowel on first attempt, continent of small amount of urine 2nd attempt.  Mod A to complete hygiene, dependent to place clean brief.  She tolerated light activity and stated that she felt a little better at close of session.  She remained in bed at close of session, call bell in hand and bed alarm set.    Therapy Documentation Precautions:  Precautions Precautions: Fall Precaution Comments: seizure precautions Restrictions Weight Bearing Restrictions: No Other Position/Activity Restrictions: head incision   Therapy/Group: Individual Therapy  Carlos Levering 08/10/2020, 7:22 AM

## 2020-08-10 NOTE — Progress Notes (Signed)
Physical Therapy Session Note  Patient Details  Name: Amy [name] MRN: [id number] Date of Birth: [birth year]  Today's Date: 08/10/2020 PT Individual Time: 1000-1045 PT Individual Time Calculation (min): 45 min  PT Amount of Missed Time (min): 30 Minutes PT Missed Treatment Reason: Patient ill (Comment) (nausea, dizzy, headache)  Short Term Goals: Week 1:  PT Short Term Goal 1 (Week 1): Patient to perform bed mobility with mod A. PT Short Term Goal 2 (Week 1): Patient to perform sit to stand with mod A. PT Short Term Goal 3 (Week 1): Bed<>chair transfers with mod A. PT Short Term Goal 4 (Week 1): Patient to initiate ambulation.  Skilled Therapeutic Interventions/Progress Updates:    Pt received seated in bed, reports feeling very nauseous this AM and initially declines participation in therapy session. Nursing notified of pt's nausea, unable to give any medication at this time. Pt agreeable to try to get up to recliner. Supine to sit with max A for BLE management and trunk elevation. Pt has increase in nausea and onset of dizziness in seated position. Returned to supine with mod A for BLE management. Assisted pt with donning knee-high TEDs and BLE ACE wrap for BP management. Pt returned to sitting EOB in similar manner as above. Pt again reports dizziness in sitting. Seated BP 117/70. Pt agreeable to get up to recliner chair. Sit to stand with max A to stedy. Stedy transfer to recliner. Pt left seated in recliner in room with needs in reach. Pt also reports onset of headache during session, nursing notified. Provided ginger ale at end of session for nausea management. Pt missed 30 min of scheduled therapy session due to symptoms of nausea, dizziness, and headache limiting ability to participate in session. Encouraged pt to remain seated in recliner x 1 hour to improve OOB and upright tolerance.  Therapy Documentation Precautions:  Precautions Precautions: Fall Precaution Comments: seizure  precautions Restrictions Weight Bearing Restrictions: No Other Position/Activity Restrictions: head incision General: PT Amount of Missed Time (min): 30 Minutes PT Missed Treatment Reason: Patient ill (Comment) (nausea, dizzy, headache)    Therapy/Group: Individual Therapy   Excell Seltzer, PT, DPT, CSRS  08/10/2020, 10:46 AM

## 2020-08-10 NOTE — Progress Notes (Signed)
SLP Cancellation Note  Patient Details Name: MICHAL STRZELECKI MRN: [id number] DOB: 13-Mar-1951   Cancelled treatment:        Attempted to see pt for scheduled treatment; however, pt reported feeling nauseated and politely requested to defer therapy at this time.  Will continue efforts at next available appointment.                                                                                                  Kaylea Mounsey, Selinda Orion 08/10/2020, 3:25 PM

## 2020-08-10 NOTE — Progress Notes (Addendum)
[facility] Progress Note   Subjective:   Patient seen and examined at bedside.  Tired today but otherwise feeling ok. Denies CP, SOB, n/v/d and abdominal pain.  Reports no issues with PD.  Objective Vitals:   08/09/20 1945 08/10/20 0425 08/10/20 0500 08/10/20 0657  BP: 126/79 116/69  125/80  Pulse: 84 79  86  Resp: 18 18  18   Temp: 98.5 F (36.9 C) 97.8 F (36.6 C)  98.2 F (36.8 C)  TempSrc: Oral Oral  Oral  SpO2: 99% 100%  98%  Weight:   52.9 kg   Height:       Physical Exam General:chroncially ill appearing female in NAD Heart:RRR Lungs:mostly CTAB, breath sounds decreased, nml WOB on RA Abdomen:soft, NTND Extremities:2+ edema from hips to calves Dialysis Access: PD cath c/d/i  [facility] Weights   08/08/20 0519 08/09/20 0500 08/10/20 0500  Weight: 55.1 kg 55.6 kg 52.9 kg    Intake/Output Summary (Last 24 hours) at 08/10/2020 0950 Last data filed at 08/10/2020 0715 Gross per 24 hour  Intake 10409 ml  Output 12184 ml  Net -1775 ml    Additional Objective Labs: Basic Metabolic Panel: Recent Labs  Lab 08/05/20 0720  NA 130*  K 4.0  CL 97*  CO2 23  GLUCOSE 102*  BUN 26*  CREATININE 2.85*  CALCIUM 8.1*   Liver Function Tests: Recent Labs  Lab 08/05/20 0720  AST 28  ALT 23  ALKPHOS 122  BILITOT 0.4  PROT 3.7*  ALBUMIN 1.1*   CBC: Recent Labs  Lab 08/08/20 0512  WBC 10.0  NEUTROABS 7.7  HGB 10.0*  HCT 30.4*  MCV 97.4  PLT 336   Blood Culture    Component Value Date/Time   SDES BLOOD BLOOD LEFT FOREARM 07/25/2020 0035   SDES BLOOD BLOOD LEFT HAND 07/25/2020 0035   SPECREQUEST  07/25/2020 0035    BOTTLES DRAWN AEROBIC AND ANAEROBIC Blood Culture results may not be optimal due to an inadequate volume of blood received in culture bottles   SPECREQUEST  07/25/2020 0035    BOTTLES DRAWN AEROBIC AND ANAEROBIC Blood Culture adequate volume   CULT  07/25/2020 0035    NO GROWTH 5 DAYS Performed at [facility], [address] [place], [place] [address]    CULT  07/25/2020 0035    NO GROWTH 5 DAYS Performed at [facility], [address] [place], [place] [address]    REPTSTATUS 07/30/2020 FINAL 07/25/2020 0035   REPTSTATUS 07/30/2020 FINAL 07/25/2020 0035    Medications: . dialysis solution 1.5% low-MG/low-CA    . dialysis solution 2.5% low-MG/low-CA     . aspirin EC  81 mg Oral Daily  . atorvastatin  40 mg Oral Daily  . brivaracetam  75 mg Oral BID  . butalbital-acetaminophen-caffeine  1 tablet Oral TID with meals  . citalopram  10 mg Oral Daily  . docusate sodium  100 mg Oral BID  . gentamicin cream  1 application Topical Daily  . heparin  5,000 Units Subcutaneous Q12H  . kidney failure book   Does not apply Once  . lacosamide  75 mg Oral BID  . melatonin  5 mg Oral QHS  . metoCLOPramide  5 mg Oral TID AC  . metoprolol tartrate  6.25 mg Oral BID  . pantoprazole  40 mg Oral Daily  . sodium bicarbonate  650 mg Oral BID  . triamcinolone cream   Topical TID    Dialysis Orders: [place]  followed by Dr. Candiss Norse. 4 exchanges overnight, 2000 cc and 1.5 hrs each.   Assessment/Plan: 1. Debility: 2/2 Multiple med issues, most recently SDH recovery and nstemi w/ CAD and low EF. Readmitted to CIR on 08/04/20. 2. Fall w/ subdural hematoma - sp SDH evacuation at [facility] on3/24/22 3. Nausea/ vomiting - etiology unclear, uremia unlikely. Improved over last 2 days. 4. Malnutrition/ debility - low albumin, very weak/tired. It has been d/w husband on several occassions about a possible trial of HD due to low albumin in a PD patient. He states they are not interested and feels she would not tolerate HD.  5. Vol overload - sig LE dependent edema and UE edema on admission. Using 1.5% and 2.5% fluids mix w/ nightly UF around 1.5- 2 L. If accurate weight down slightly this AM.  Upper extremity edema improving.  Needs to adhere to fluid restriction since no significant change  in weight/edema.  Need standing weights for accuracy.   6. ESRD -ONCCPDin [place], [place], f/b Dr Candiss Norse of [place].Good solute control per labs. Cont PD nightly 4x 2000 cc.   7. Multivessel CAD-  with reduced EF 25-30% by recent cath in Feb 2022. Per TCTSwasnotCABG candidate. UnderwentPCI to LAD. At [facility] echo per family showed improved EF 55%. 8. Chronichyponatremia - stable, slightly improved this AM. ON 1247mL fluid restriction. If starts to worsen again may need to consider NaCl tabs. 9. Metabolic acidosis - getting bid sod bicarb 10. HTN -BP well controlled on lopressor 6.25mg  BID 11. Anemiaof ESRD-was gettingAranesp 100 MCG q Satwhile herelast admit. Hgb 10.0, restart Aranesp.Lasttsat 91%on 06/20/20. Ferritinwas1131.  9. Nutrition:Alb very low, 1.1. Renal diet w/fluid restrictions.Protein supplements.  10. Seizures -per primary team 11. H/oC diff -rx'd in Feb 2022 12. FTT - overall poor prognosis   Jen Mow, PA-C [facility] 08/10/2020,9:50 AM  LOS: 6 days   Pt seen, examined and agree w assess/plan as above with additions as indicated.  [facility] 08/10/2020, 12:02 PM

## 2020-08-10 NOTE — Progress Notes (Signed)
[facility] PHYSICAL MEDICINE & REHABILITATION PROGRESS NOTE  Subjective/Complaints:  No nausea today, had a headache (chronic unspecified HA at home on fioricet)  Appreciate renal note  ROS: - Nausea , -vomiting.  Denies CP, SOB, diarrhea  Objective: Vital Signs: Blood pressure 125/80, pulse 86, temperature 98.2 F (36.8 C), temperature source Oral, resp. rate 18, height 5\' 4"  (1.626 m), weight 52.9 kg, SpO2 98 %. No results found. Recent Labs    08/08/20 0512  WBC 10.0  HGB 10.0*  HCT 30.4*  PLT 336   No results for input(s): NA, K, CL, CO2, GLUCOSE, BUN, CREATININE, CALCIUM in the last 72 hours.  Intake/Output Summary (Last 24 hours) at 08/10/2020 0902 Last data filed at 08/10/2020 0715 Gross per 24 hour  Intake 10409 ml  Output 12184 ml  Net -1775 ml        Physical Exam: BP 125/80 (BP Location: Right Arm)   Pulse 86   Temp 98.2 F (36.8 C) (Oral)   Resp 18   Ht 5\' 4"  (1.626 m)   Wt 52.9 kg   SpO2 98%   BMI 20.02 kg/m    General: No acute distress Mood and affect are appropriate Heart: Regular rate and rhythm no rubs murmurs or extra sounds Lungs: Clear to auscultation, breathing unlabored, no rales or wheezes Abdomen: Positive bowel sounds, soft nontender to palpation, nondistended Extremities: No clubbing, cyanosis, or edema Skin: No evidence of breakdown, no evidence of rash Neuro: Alert Motor: LUE: 4-4+/5 proximal distal, unchanged RUE: 4 --4/5 proximal distal, stable LLE: Hip flexion 2/5, knee extension 2/5, ankle dorsiflexion 3/5, improving RLE: Hip flexion 2/5, knee extension 2/5, ankle dorsiflexion 3/5, improving  Assessment/Plan: 1. Functional deficits which require 3+ hours per day of interdisciplinary therapy in a comprehensive inpatient rehab setting.  Physiatrist is providing close team supervision and 24 hour management of active medical problems listed below.  Physiatrist and rehab team continue to assess barriers to discharge/monitor  patient progress toward functional and medical goals   Care Tool:  Bathing    Body parts bathed by patient: Right arm,Left arm,Chest,Abdomen,Front perineal area,Face,Right upper leg,Left upper leg,Buttocks   Body parts bathed by helper: Buttocks,Right upper leg,Left upper leg,Right lower leg,Left lower leg     Bathing assist Assist Level: Moderate Assistance - Patient 50 - 74%     Upper Body Dressing/Undressing Upper body dressing   What is the patient wearing?: Pull over shirt    Upper body assist Assist Level: Minimal Assistance - Patient > 75%    Lower Body Dressing/Undressing Lower body dressing      What is the patient wearing?: Incontinence brief,Pants     Lower body assist Assist for lower body dressing: Maximal Assistance - Patient 25 - 49%     Toileting Toileting    Toileting assist Assist for toileting: Maximal Assistance - Patient 25 - 49%     Transfers Chair/bed transfer  Transfers assist     Chair/bed transfer assist level: Dependent - mechanical lift (Stedy) Chair/bed transfer assistive device: Armrests,Bedrails   Locomotion Ambulation   Ambulation assist   Ambulation activity did not occur: Safety/medical concerns          Walk 10 feet activity   Assist  Walk 10 feet activity did not occur: Safety/medical concerns        Walk 50 feet activity   Assist Walk 50 feet with 2 turns activity did not occur: Safety/medical concerns         Walk 150 feet  activity   Assist Walk 150 feet activity did not occur: Safety/medical concerns         Walk 10 feet on uneven surface  activity   Assist Walk 10 feet on uneven surfaces activity did not occur: Safety/medical concerns         Wheelchair     Assist Will patient use wheelchair at discharge?: Yes Type of Wheelchair: Manual    Wheelchair assist level: Minimal Assistance - Patient > 75% Max wheelchair distance: 29'    Wheelchair 50 feet with 2 turns  activity    Assist    Wheelchair 50 feet with 2 turns activity did not occur: Safety/medical concerns       Wheelchair 150 feet activity     Assist  Wheelchair 150 feet activity did not occur: Safety/medical concerns        Medical Problem List and Plan: 1.  Right hemiparesis and aphasia secondary to large left SDH s/p craniotomy  Continue CIR PT, OT, SLP  2.  Antithrombotics: -DVT/anticoagulation:  Mechanical: Sequential compression devices, below knee Bilateral lower extremities             -antiplatelet therapy: N/A due to bleed.  3. Chronic migraines/ Pain Management: Uses Fioricet 3-4 times a day on average.   Controlled with meds on 4/10  Monitor with increased exertion 4. Mood: LCSW to follow for evaluation and support.              -antipsychotic agents: N/A 5. Neuropsych: This patient is not fully capable of making decisions on her own behalf. 6. Skin/Wound Care: Monitor wound for healing.             --offer supplements. Family to provide meals from home as able.  7. Fluids/Electrolytes/Nutrition: Continue PD--does not need fluid restriction.              --has had chronic issues with variable intake as well as N/V likely due to gastroparesis. 8. Chronic Hypotension:  TEDs when out of bed             Vitals:   08/10/20 0425 08/10/20 0657  BP: 116/69 125/80  Pulse: 79 86  Resp: 18 18  Temp: 97.8 F (36.6 C) 98.2 F (36.8 C)  SpO2: 100% 98%  Normotensive on 4/10 9.  ESRD on PD: Nephrology to assist/manage PD.  10.  Recurrent Seizures: Has been seizure free on Brivaracetam and Lacosamide bid.              --Lacosamide was decreased on 03/31 to avoid sedative SE.  No breakthrough seizures since admission-4/10 11. CAD/NSTEMI s/p PCI: Treated medically.   Off DAPT due to bleed             --Continue Lipitor.   Metoprolol low-dose resumed , HR controlled 12. Acute on chronic anemia:   Hemoglobin 10.0 on 4/8, labs ordered for Monday  Continue to  monitor 13. H/o Depression: On Celexa 14. Sick Euthyroid: Will need follow up as illness resolves.              --TSH- 5.80/Free T4-1.2 (03/27) 15. Gastroparesis w/chronic N/V/anorexia: Has been ongoing per records review.              --question failure of PD (husband reports no N/V with brief episode of HD in the past)  Reglan 5 3 times daily, vomiting resolved still has some nausea  Hesitant to continue increase medications due to #18  Will discuss with nephrology 16.  Hyponatremia             --  chronic hyponatremia with Na-130-133 range  Sodium 130 on 4/5             --Sodium bicarb to manage metabolic acidosis. 17.  Severe hypoalbuminemia  Supplement initiated on 4/5, supplement DC'd due to concern for association with nausea/vomiting, however does not appear to have made significant difference 18.  Prolonged QTC  ECG reviewed, improved since last visit  Compazine and dose/frequency decreased    LOS: 6 days A FACE TO FACE EVALUATION WAS PERFORMED  Charlett Blake 08/10/2020, 9:02 AM

## 2020-08-11 DIAGNOSIS — S065X9A Traumatic subdural hemorrhage with loss of consciousness of unspecified duration, initial encounter: Secondary | ICD-10-CM | POA: Diagnosis not present

## 2020-08-11 LAB — CBC WITH DIFFERENTIAL/PLATELET
Abs Immature Granulocytes: 0.05 10*3/uL (ref 0.00–0.07)
Basophils Absolute: 0.1 10*3/uL (ref 0.0–0.1)
Basophils Relative: 1 %
Eosinophils Absolute: 0.6 10*3/uL — ABNORMAL HIGH (ref 0.0–0.5)
Eosinophils Relative: 5 %
HCT: 33.2 % — ABNORMAL LOW (ref 36.0–46.0)
Hemoglobin: 10.7 g/dL — ABNORMAL LOW (ref 12.0–15.0)
Immature Granulocytes: 1 %
Lymphocytes Relative: 10 %
Lymphs Abs: 1 10*3/uL (ref 0.7–4.0)
MCH: 31.7 pg (ref 26.0–34.0)
MCHC: 32.2 g/dL (ref 30.0–36.0)
MCV: 98.2 fL (ref 80.0–100.0)
Monocytes Absolute: 0.8 10*3/uL (ref 0.1–1.0)
Monocytes Relative: 8 %
Neutro Abs: 7.9 10*3/uL — ABNORMAL HIGH (ref 1.7–7.7)
Neutrophils Relative %: 75 %
Platelets: 345 10*3/uL (ref 150–400)
RBC: 3.38 MIL/uL — ABNORMAL LOW (ref 3.87–5.11)
RDW: 15.8 % — ABNORMAL HIGH (ref 11.5–15.5)
WBC: 10.4 10*3/uL (ref 4.0–10.5)
nRBC: 0 % (ref 0.0–0.2)

## 2020-08-11 NOTE — Progress Notes (Signed)
Physical Therapy Session Note  Patient Details  Name: Amy [name] MRN: [id number] Date of Birth: 06-30-1950  Today's Date: 08/11/2020 PT Individual Time: 1500-1600 PT Individual Time Calculation (min): 60 min   Short Term Goals: Week 1:  PT Short Term Goal 1 (Week 1): Patient to perform bed mobility with mod A. PT Short Term Goal 2 (Week 1): Patient to perform sit to stand with mod A. PT Short Term Goal 3 (Week 1): Bed<>chair transfers with mod A. PT Short Term Goal 4 (Week 1): Patient to initiate ambulation.  Skilled Therapeutic Interventions/Progress Updates:    Patient in supine and reports not feeling well due to nausea.  Patient performed LE therex consisting of ankle pumps, hooklying hip flexion, bridging with A, hooklying hip abduction with orange t-band, lateral trunk rolls.  Patient supine to side to sit with mod A using rails and increased time with cues for step by step maneuvers.  Patient seated EOB checked BP 111/65 HR 76 in supine and 104/62 HR 91 in sitting.  But pt not tolerating well and requesting to supine.  Scooting along EOB with mod A and cues then sit to supine mod A for both LE"s then cues and assist to move legs and head for positioning.  Patient sat up in chair position in bed and brushed teeth with set up.  Patient supine to sit with HOB up with mod A.  Performed sit to stand to [employer] x 3 reps, first from bed with +2 A then from [place] with mod A each time, but mod A with uncontrolled descent back to EOB.  Patient c/o fatigue.  Assisted with both legs to supine and adjusting shoulders and hips, then +2 to scoot to [facility].  Back up in chair position and left with call bell and needs in reach and bed alarm active.   Therapy Documentation Precautions:  Precautions Precautions: Fall Precaution Comments: seizure precautions Restrictions Weight Bearing Restrictions: No Other Position/Activity Restrictions: head incision Pain: Pain Assessment Pain Score: 0-No  pain   Therapy/Group: Individual Therapy  Reginia Naas  Magda Kiel, PT 08/11/2020, 3:32 PM

## 2020-08-11 NOTE — Progress Notes (Signed)
PHYSICAL MEDICINE & REHABILITATION PROGRESS NOTE  Subjective/Complaints: No complaints Patient's chart reviewed- No issues reported overnight Vitals signs stable  ROS: - Nausea , -vomiting.  Denies CP, SOB, diarrhea  Objective: Vital Signs: Blood pressure 110/64, pulse 81, temperature 98.3 F (36.8 C), resp. rate 16, height 5\' 4"  (1.626 m), weight 49.4 kg, SpO2 100 %. No results found. No results for input(s): WBC, HGB, HCT, PLT in the last 72 hours. No results for input(s): NA, K, CL, CO2, GLUCOSE, BUN, CREATININE, CALCIUM in the last 72 hours.  Intake/Output Summary (Last 24 hours) at 08/11/2020 1156 Last data filed at 08/10/2020 1758 Gross per 24 hour  Intake 200 ml  Output --  Net 200 ml        Physical Exam: BP 110/64 (BP Location: Left Arm)   Pulse 81   Temp 98.3 F (36.8 C)   Resp 16   Ht 5\' 4"  (1.626 m)   Wt 49.4 kg   SpO2 100%   BMI 18.69 kg/m  Gen: no distress, normal appearing HEENT: oral mucosa pink and moist, NCAT Cardio: Reg rate Chest: normal effort, normal rate of breathing Abd: soft, non-distended Ext: no edema Psych: pleasant, normal affect Skin: intact Neuro: Neuro: Alert Motor: LUE: 4-4+/5 proximal distal, unchanged RUE: 4 --4/5 proximal distal, stable LLE: Hip flexion 2/5, knee extension 2/5, ankle dorsiflexion 3/5, improving RLE: Hip flexion 2/5, knee extension 2/5, ankle dorsiflexion 3/5, improving  Assessment/Plan: 1. Functional deficits which require 3+ hours per day of interdisciplinary therapy in a comprehensive inpatient rehab setting.  Physiatrist is providing close team supervision and 24 hour management of active medical problems listed below.  Physiatrist and rehab team continue to assess barriers to discharge/monitor patient progress toward functional and medical goals   Care Tool:  Bathing    Body parts bathed by patient: Right arm,Left arm,Chest,Abdomen,Front perineal area,Face,Right upper leg,Left upper  leg,Buttocks   Body parts bathed by helper: Buttocks,Right upper leg,Left upper leg,Right lower leg,Left lower leg     Bathing assist Assist Level: Moderate Assistance - Patient 50 - 74%     Upper Body Dressing/Undressing Upper body dressing   What is the patient wearing?: Pull over shirt    Upper body assist Assist Level: Minimal Assistance - Patient > 75%    Lower Body Dressing/Undressing Lower body dressing      What is the patient wearing?: Incontinence brief,Pants     Lower body assist Assist for lower body dressing: Maximal Assistance - Patient 25 - 49%     Toileting Toileting    Toileting assist Assist for toileting: Maximal Assistance - Patient 25 - 49%     Transfers Chair/bed transfer  Transfers assist     Chair/bed transfer assist level: Dependent - mechanical lift Chair/bed transfer assistive device: Armrests,Bedrails   Locomotion Ambulation   Ambulation assist   Ambulation activity did not occur: Safety/medical concerns          Walk 10 feet activity   Assist  Walk 10 feet activity did not occur: Safety/medical concerns        Walk 50 feet activity   Assist Walk 50 feet with 2 turns activity did not occur: Safety/medical concerns         Walk 150 feet activity   Assist Walk 150 feet activity did not occur: Safety/medical concerns         Walk 10 feet on uneven surface  activity   Assist Walk 10 feet on uneven surfaces activity did not  occur: Safety/medical concerns         Wheelchair     Assist Will patient use wheelchair at discharge?: Yes Type of Wheelchair: Manual    Wheelchair assist level: Minimal Assistance - Patient > 75% Max wheelchair distance: 7'    Wheelchair 50 feet with 2 turns activity    Assist    Wheelchair 50 feet with 2 turns activity did not occur: Safety/medical concerns       Wheelchair 150 feet activity     Assist  Wheelchair 150 feet activity did not occur:  Safety/medical concerns        Medical Problem List and Plan: 1.  Right hemiparesis and aphasia secondary to large left SDH s/p craniotomy  Continue CIR PT, OT, SLP  2.  Impaired mobility -DVT/anticoagulation:  Mechanical: Continue Sequential compression devices, below knee Bilateral lower extremities             -antiplatelet therapy: N/A due to bleed.  3. Chronic migraines/ Pain Management: Continue Fioricet 3-4 times a day on average.   Controlled with meds on 4/11  Monitor with increased exertion 4. Mood: LCSW to follow for evaluation and support.              -antipsychotic agents: N/A 5. Neuropsych: This patient is not fully capable of making decisions on her own behalf. 6. Skin/Wound Care: Monitor wound for healing.             --offer supplements. Family to provide meals from home as able.  7. Fluids/Electrolytes/Nutrition: Continue PD--does not need fluid restriction.              --has had chronic issues with variable intake as well as N/V likely due to gastroparesis. 8. Chronic Hypotension: Continue TEDs when out of bed             Vitals:   08/11/20 0434 08/11/20 0707  BP: 116/71 110/64  Pulse: 79 81  Resp: 16   Temp: 98.5 F (36.9 C) 98.3 F (36.8 C)  SpO2: 99% 100%  Normotensive on 4/11 9.  ESRD on PD: Nephrology to assist/manage PD.  10.  Recurrent Seizures: Has been seizure free on Brivaracetam and Lacosamide bid.              --Lacosamide was decreased on 03/31 to avoid sedative SE.  No breakthrough seizures since admission-4/10 11. CAD/NSTEMI s/p PCI: Treated medically.   Off DAPT due to bleed             --Continue Lipitor.   Metoprolol low-dose resumed , HR controlled 12. Acute on chronic anemia:   Hemoglobin 10.0 on 4/8  Continue to monitor 13. H/o Depression: Continue Celexa 14. Sick Euthyroid: Will need follow up as illness resolves.              --TSH- 5.80/Free T4-1.2 (03/27) 15. Gastroparesis w/chronic N/V/anorexia: Has been ongoing per  records review.              --question failure of PD (husband reports no N/V with brief episode of HD in the past)  Reglan 5 3 times daily, vomiting resolved still has some nausea  Hesitant to continue increase medications due to #18  Will discuss with nephrology 16.  Hyponatremia             --chronic hyponatremia with Na-130-133 range  Sodium 130 on 4/5             --Sodium bicarb to manage metabolic acidosis. 17.  Severe  hypoalbuminemia  Supplement initiated on 4/5, supplement DC'd due to concern for association with nausea/vomiting, however does not appear to have made significant difference 18.  Prolonged QTC  ECG reviewed, improved since last visit  Compazine and dose/frequency decreased    LOS: 7 days A FACE TO FACE EVALUATION WAS PERFORMED  Martha Clan P Briton Sellman 08/11/2020, 11:56 AM

## 2020-08-11 NOTE — Progress Notes (Signed)
Nutrition Follow-up  DOCUMENTATION CODES:   Severe malnutrition in context of chronic illness  INTERVENTION:   - Recommend GOC discussion regarding nutrition support; if within patient's GOC, recommend Cortrak NG tube placement and initiation of enteral nutrition as pt with severe malnutrition and ongoing poor PO intake.  - Continue Magic Cup TID with meals, each supplement provides 290 kcal and 9 grams of protein  - Continue chocolate milk TID with meals per pt request  - Continue vanilla ice cream TID with meals (to mix with orange cream Magic Cups)  - d/c Nepro Shakes as pt does not like them and family reports that protein shakes make nausea/vomiting worse  - Encourage adequate PO intake  NUTRITION DIAGNOSIS:   Severe Malnutrition related to chronic illness (ESRD) as evidenced by severe fat depletion,severe muscle depletion.  New diagnosis after completion of NFPE  GOAL:   Patient will meet greater than or equal to 90% of their needs  Unmet  MONITOR:   PO intake,Supplement acceptance,Labs,Weight trends,I & O's  REASON FOR ASSESSMENT:   Other (history of malnutrition)    ASSESSMENT:   70 year old female with PMH of ESRD on PD, chronic hyponatremia, C diff colitis, seizures, NSTEMI s/p PCI w/ DAPT, chronic issues with N/V resulting in debility and CIR stay 3/04-3/24/22. Pt was discharged to home and experienced a mild fall that afternoon followed by obtundation that evening. Pt was evaluated at [facility] 07/24/20 and found to have acute on chronic left SDH. Pt was transferred to [facility] and underwent left partial craniotomy for evacuation of SDH with placement of subdural drain which was removed on 3/28. Cortrak placed at [facility] for nutrition support. Admitted to CIR on 4/04.  Spoke with pt at bedside. Pt with untouched Nepro shake on tray table. Pt did not want to drink a warm nutrition supplement so RD provided pt with a cold Ensure supplement as pt's flavor preference in the  Nepro shake was unavailable. Pt consumed 3 sips during RD visit. Discussed with RN.  Pt reports no N/V today. Later, RD received secure chat from OT that pt had vomited and pt's daughter had previously told OT that protein shakes make pt's N/V worse. Noted Nephrology ordered Nepro shakes. RD will discontinue.  Pt's lunch meal tray came at time of visit. RD assisted pt in cutting up food and opening up containers. Pt appreciative. Pt states that she continues not to be hungry and eats what she feels like she can at mealtimes.  Pt is severely malnourished and demonstrates ongoing inadequate PO intake. Recommend [place] discussion regarding nutrition support. If within [place], recommend Cortrak NG tube placement and initiation of enteral nutrition.  CIR admit weight: 55.5 kg Current weight: 49.4 kg  Meal Completion: 15-50%  Medications reviewed and include: aranesp weekly, colace, Nepro TID, melatonin, reglan 5 mg TID, rena-vit, protonix, sodium bicarb 650 mg BID  Labs reviewed: sodium 130, albumin 1.1  NUTRITION - FOCUSED PHYSICAL EXAM:  Flowsheet Row Most Recent Value  Orbital Region Severe depletion  Upper Arm Region Severe depletion  Thoracic and Lumbar Region Severe depletion  Buccal Region No depletion  Temple Region Severe depletion  Clavicle Bone Region Severe depletion  Clavicle and Acromion Bone Region Severe depletion  Scapular Bone Region Severe depletion  Dorsal Hand Severe depletion  Patellar Region Severe depletion  Anterior Thigh Region Severe depletion  Posterior Calf Region Severe depletion  Edema (RD Assessment) Moderate  Hair Reviewed  Eyes Reviewed  Mouth Reviewed  Skin Reviewed  Nails Reviewed  Diet Order:   Diet Order            Diet regular Room service appropriate? Yes; Fluid consistency: Thin; Fluid restriction: 1200 mL Fluid  Diet effective now                 EDUCATION NEEDS:   Education needs have been addressed  Skin:  Skin  Assessment: Skin Integrity Issues: Incisions: head  Last BM:  08/11/20 medium type 6  Height:   Ht Readings from Last 1 Encounters:  08/04/20 5\' 4"  (1.626 m)    Weight:   Wt Readings from Last 1 Encounters:  08/11/20 49.4 kg    BMI:  Body mass index is 18.69 kg/m.  Estimated Nutritional Needs:   Kcal:  1700-1900  Protein:  85-100 grams  Fluid:  1000 ml + UOP    Gustavus Bryant, MS, RD, LDN Inpatient Clinical Dietitian Please see AMiON for contact information.

## 2020-08-11 NOTE — Progress Notes (Signed)
Speech Language Pathology Weekly Progress and Session Note  Patient Details  Name: Amy [name] MRN: [id number] Date of Birth: [birth year]  Beginning of progress report period: August 04, 2020 End of progress report period: August 11, 2020  Today's Date: 08/11/2020 SLP Individual Time: 1400-1455 SLP Individual Time Calculation (min): 55 min  Short Term Goals: Week 1: SLP Short Term Goal 1 (Week 1): Pt will demonstrate sustained attention in functional tasks in 10 minue intervals with min A verbal cues for redirection. SLP Short Term Goal 1 - Progress (Week 1): Met SLP Short Term Goal 2 (Week 1): Pt will demonstrate mildly complex problem solving skills in functional tasks with min A verbal cues. SLP Short Term Goal 2 - Progress (Week 1): Not met SLP Short Term Goal 3 (Week 1): Pt will demonstrate self-monitor and self-correction in functional errors with min A verbal cues. SLP Short Term Goal 3 - Progress (Week 1): Not met SLP Short Term Goal 4 (Week 1): Pt will demonstrate recall of daily, novel information with supervision A verbal cues for visual aids. SLP Short Term Goal 4 - Progress (Week 1): Not met    New Short Term Goals: Week 2: SLP Short Term Goal 1 (Week 2): Patient will utilize external memory aids to recall new, daily information with Min A verbal cues. SLP Short Term Goal 2 (Week 2): Pt will demonstrate sustained attention in functional tasks in 30 minue intervals with min A verbal cues for redirection. SLP Short Term Goal 3 (Week 2): Pt will demonstrate mildly complex problem solving skills in functional tasks with min A verbal cues. SLP Short Term Goal 4 (Week 2): Pt will demonstrate self-monitor and self-correction in functional errors with min A verbal cues.  Weekly Progress Updates: Patient has made slow gains and has met 1 of 4 STGs this reporting period. Currently, patient requires overall Min-Mod A verbal cues to complete functional and familiar tasks safely in regards  to problem solving, attention, error awareness and recall with use of strategies. Progress can be limited at times due to patient not feeling well and overall fatigue. [name] and family education ongoing. Patient would benefit from continued skilled SLP intervention to maximize her cognitive functioning and overall functional independence prior to discharge.     Intensity: Minumum of 1-2 x/day, 30 to 90 minutes Frequency: 3 to 5 out of 7 days Duration/Length of Stay: 09/02/20 Treatment/Interventions: Cognitive remediation/compensation;Cueing hierarchy;Functional tasks;Medication managment;Patient/family education;Internal/external aids;Therapeutic Activities;Environmental controls   Daily Session  Skilled Therapeutic Interventions: Skilled treatment session focused on cognitive goals. Upon arrival, patient reported she was not feeling well and overall felt lethargic. SLP facilitated session by providing Min verbal cues for recall of information while generating a calendar with specific appointments. Patient had not used her memory notebook all weekend but reports she does prefer pen and paper to maximize recall. At end of session, patient left upright in bed with alarm on and all needs within reach. Continue with current plan of care.     Pain No/Denies Pain   Therapy/Group: Individual Therapy  Amy [name], [place] 08/11/2020, 3:21 PM

## 2020-08-11 NOTE — Progress Notes (Signed)
Occupational Therapy Weekly Progress Note  Patient Details  Name: JOELEEN WORTLEY MRN: [id number] Date of Birth: 1951/02/01  Beginning of progress report period: August 05, 2020 End of progress report period: August 11, 2020     Patient has met 2 of 4 short term goals.  Patient met adl level goals due to increased strength and comfort with completing these tasks at bed level.  Mobility goals not met as she is having ongoing nausea and fatigue limiting tolerance for OOB  Patient continues to demonstrate the following deficits: muscle weakness, decreased cardiorespiratoy endurance, decreased coordination and decreased sitting balance, decreased standing balance and decreased postural control and therefore will continue to benefit from skilled OT intervention to enhance overall performance with BADL.  Patient progressing toward long term goals..  Continue plan of care.  OT Short Term Goals Week 1:  OT Short Term Goal 1 (Week 1): patient will complete LB adl bed level with mod A OT Short Term Goal 1 - Progress (Week 1): Met OT Short Term Goal 2 (Week 1): Patient will complete UB adl w/c level with min A OT Short Term Goal 2 - Progress (Week 1): Met OT Short Term Goal 3 (Week 1): Patient will complete bed mobility with mod A OT Short Term Goal 3 - Progress (Week 1): Progressing toward goal OT Short Term Goal 4 (Week 1): patient will complete functional sit or stand pivot transfer with mod A OT Short Term Goal 4 - Progress (Week 1): Progressing toward goal Week 2:  OT Short Term Goal 1 (Week 2): patient will complete bathing/dressing tasks edge of bed with min A/CS OT Short Term Goal 2 (Week 2): patient will complete functional transfers mod A OT Short Term Goal 3 (Week 2): patient will tolerate sitting OOB for 1 hour daily  Dorita Rowlands A Shamal Stracener 08/11/2020, 12:10 PM

## 2020-08-11 NOTE — Progress Notes (Signed)
[facility] Progress Note   Subjective:   Patient seen and examined at bedside.  No acute events overnight. Tolerated PD last night with no reported issues.  Objective Vitals:   08/10/20 1941 08/11/20 0434 08/11/20 0702 08/11/20 0707  BP: 122/65 116/71  110/64  Pulse: 72 79  81  Resp: 17 16    Temp: 98.3 F (36.8 C) 98.5 F (36.9 C)  98.3 F (36.8 C)  TempSrc: Oral  Oral   SpO2: 99% 99%  100%  Weight:      Height:       Physical Exam General:chroncially ill appearing female in NAD, laying flat in bed Heart:RRR Lungs: CTA bl, speaking in full sentences, unlabored, bl ches texpansion Abdomen:soft, NTND Extremities: very trace edema bl le's Neuro: awake, alert, speech clear and coherent Dialysis Access: PD cath c/d/i  Filed Weights   08/08/20 0519 08/09/20 0500 08/10/20 0500  Weight: 55.1 kg 55.6 kg 52.9 kg    Intake/Output Summary (Last 24 hours) at 08/11/2020 1056 Last data filed at 08/10/2020 1758 Gross per 24 hour  Intake 200 ml  Output --  Net 200 ml    Additional Objective Labs: Basic Metabolic Panel: Recent Labs  Lab 08/05/20 0720  NA 130*  K 4.0  CL 97*  CO2 23  GLUCOSE 102*  BUN 26*  CREATININE 2.85*  CALCIUM 8.1*   Liver Function Tests: Recent Labs  Lab 08/05/20 0720  AST 28  ALT 23  ALKPHOS 122  BILITOT 0.4  PROT 3.7*  ALBUMIN 1.1*   CBC: Recent Labs  Lab 08/08/20 0512  WBC 10.0  NEUTROABS 7.7  HGB 10.0*  HCT 30.4*  MCV 97.4  PLT 336   Blood Culture    Component Value Date/Time   SDES BLOOD BLOOD LEFT FOREARM 07/25/2020 0035   SDES BLOOD BLOOD LEFT HAND 07/25/2020 0035   SPECREQUEST  07/25/2020 0035    BOTTLES DRAWN AEROBIC AND ANAEROBIC Blood Culture results may not be optimal due to an inadequate volume of blood received in culture bottles   SPECREQUEST  07/25/2020 0035    BOTTLES DRAWN AEROBIC AND ANAEROBIC Blood Culture adequate volume   CULT  07/25/2020 0035    NO GROWTH 5 DAYS Performed at [facility], [address] [place], [place] [address]    CULT  07/25/2020 0035    NO GROWTH 5 DAYS Performed at [facility], [address] [place], [place] [address]    REPTSTATUS 07/30/2020 FINAL 07/25/2020 0035   REPTSTATUS 07/30/2020 FINAL 07/25/2020 0035    Medications: . dialysis solution 1.5% low-MG/low-CA    . dialysis solution 2.5% low-MG/low-CA     . aspirin EC  81 mg Oral Daily  . atorvastatin  40 mg Oral Daily  . brivaracetam  75 mg Oral BID  . butalbital-acetaminophen-caffeine  1 tablet Oral TID with meals  . citalopram  10 mg Oral Daily  . darbepoetin (ARANESP) injection - NON-DIALYSIS  60 mcg Subcutaneous Q Mon-1800  . docusate sodium  100 mg Oral BID  . feeding supplement (NEPRO CARB STEADY)  237 mL Oral TID AC  . gentamicin cream  1 application Topical Daily  . heparin  5,000 Units Subcutaneous Q12H  . kidney failure book   Does not apply Once  . lacosamide  75 mg Oral BID  . melatonin  5 mg Oral QHS  . metoCLOPramide  5 mg Oral TID AC  . metoprolol tartrate  6.25 mg Oral BID  . multivitamin  1 tablet Oral QHS  . pantoprazole  40 mg Oral Daily  . sodium bicarbonate  650 mg Oral BID  . triamcinolone cream   Topical TID    Dialysis Orders: [place] followed by Dr. Candiss Norse ([facility]). 4 exchanges overnight, 2000 cc and 1.5 hrs each.   Assessment/Plan: 1. Debility: 2/2 Multiple med issues, most recently SDH recovery and nstemi w/ CAD and low EF. Readmitted to CIR on 08/04/20. 2. Fall w/ subdural hematoma - sp SDH evacuation at [facility] on3/24/22 3. Nausea/ vomiting - etiology unclear, uremia unlikely. Improved over last 2 days. 4. Malnutrition/ debility - low albumin, very weak/tired. It has been d/w husband on several occassions in the past about a possible trial of HD due to low albumin in a PD patient. He had stated they are not interested and feels she would not tolerate HD.  5. Vol overload - sig LE dependent edema and UE  edema on admission. Using 1.5% and 2.5% fluids mix w/ nightly UF around 1.5- 2 L. If accurate weight down slightly this AM.  Upper extremity edema improved.  Needs to adhere to fluid restriction since no significant change in weight/edema.  Need standing weights for accuracy.   6. ESRD -[place], [place], f/b Dr Candiss Norse of [place].Good solute control per labs. Cont PD nightly 4x 2000 cc.   7. Multivessel CAD-  with reduced EF 25-30% by recent cath in Feb 2022. Per TCTSwasnotCABG candidate. UnderwentPCI to LAD. At [facility] repeat echo per family showed improved EF 55%. 8. Chronichyponatremia - stable 4/5. On 1245mL fluid restriction. If starts to worsen again may need to consider NaCl tabs. Monitoring for now 9. Metabolic acidosis - getting bid sod bicarb 10. HTN -BP well controlled on lopressor 6.25mg  BID 9. Anemiaof ESRD-was gettingAranesp 100 MCG q Satwhile herelast admit.  Aranesp r/s'ed, hgb stable 4/8 10. Nutrition:Alb very low, 1.1. Renal diet w/fluid restrictions.Protein supplements.  11. Seizures -per primary team 12. H/oC diff -rx'd in Feb 2022 13. FTT - overall poor prognosis   Gean Quint, MD [facility] 08/11/2020,10:56 AM  LOS: 7 days

## 2020-08-11 NOTE — Progress Notes (Signed)
Occupational Therapy Session Note  Patient Details  Name: Amy [name] MRN: [id number] Date of Birth: 1951-03-03  Today's Date: 08/11/2020 OT Individual Time: 1100-1200 OT Individual Time Calculation (min): 60 min    Short Term Goals: Week 1:  OT Short Term Goal 1 (Week 1): patient will complete LB adl bed level with mod A OT Short Term Goal 2 (Week 1): Patient will complete UB adl w/c level with min A OT Short Term Goal 3 (Week 1): Patient will complete bed mobility with mod A OT Short Term Goal 4 (Week 1): patient will complete functional sit or stand pivot transfer with mod A  Skilled Therapeutic Interventions/Progress Updates:    Patient in bed, alert, ate some of her breakfast but now feels nauseous.  She refused to get OOB but did participate in bed level adl tasks.  She is able to bathe with min A for buttocks.   toileting on bed pan due to fatigue/nausea - max A for doff brief, placing bed pan, hygiene and placing clean brief - continent of small bowel and bladder.   UB dressing min A.  Oral care with set up.  LB dressing min A for pants over feet, max A over hips via rolling.  With encouragement sat edge of bed with max A for side lying to sitting.  Able to maintain unsupported sitting with CS for 5 minutes sit to stand at edge of bed max A - maintained for approx 1 minute.  Sit to supine max A.  Assisted back onto bed pan due to ongoing urge for BM.  Daughter entered at close of session - will alert nursing when finished.  Bed alarm set and call bell in hand.    Therapy Documentation Precautions:  Precautions Precautions: Fall Precaution Comments: seizure precautions Restrictions Weight Bearing Restrictions: No Other Position/Activity Restrictions: head incision   Therapy/Group: Individual Therapy  Carlos Levering 08/11/2020, 7:38 AM

## 2020-08-12 ENCOUNTER — Telehealth: Payer: Self-pay

## 2020-08-12 DIAGNOSIS — S065X9A Traumatic subdural hemorrhage with loss of consciousness of unspecified duration, initial encounter: Secondary | ICD-10-CM | POA: Diagnosis not present

## 2020-08-12 MED ORDER — DARBEPOETIN ALFA 60 MCG/0.3ML IJ SOSY
60.0000 ug | PREFILLED_SYRINGE | INTRAMUSCULAR | Status: DC
Start: 2020-08-12 — End: 2020-09-02
  Administered 2020-08-12 – 2020-08-26 (×3): 60 ug via SUBCUTANEOUS
  Filled 2020-08-12 (×4): qty 0.3

## 2020-08-12 NOTE — Progress Notes (Signed)
Patient ID: Amy [name], female   DOB: Mar 31, 1951, 70 y.o.   MRN: [id number] Follow up on patient regarding discussion of intolerance to protein shakes. Patient noted she prefers the orange magic cup over other flavors and is tolerating the supplement as long as she takes her time and eats slowly. No other concerns at present. Reported she is tired and has had several naps and it is only 4p. Concerned about discharge and wants to go home sooner than projected. Reported she will keep on with therapy and hopefully she will do well enough to go home before the projected date. Support given to patient. Continue to follow along to discharge for educational needs. Margarito Liner

## 2020-08-12 NOTE — Progress Notes (Signed)
Occupational Therapy Session Note  Patient Details  Name: Amy [name] MRN: [id number] Date of Birth: 1951/04/27  Today's Date: 08/12/2020 OT Individual Time: 1300-1400 OT Individual Time Calculation (min): 60 min    Short Term Goals: Week 2:  OT Short Term Goal 1 (Week 2): patient will complete bathing/dressing tasks edge of bed with min A/CS OT Short Term Goal 2 (Week 2): patient will complete functional transfers mod A OT Short Term Goal 3 (Week 2): patient will tolerate sitting OOB for 1 hour daily  Skilled Therapeutic Interventions/Progress Updates:    patient seated in recliner - with nursing she had just vomited a large amount.  Assisted with clean up, oral care and change of shirt.  Completed light trunk activity seated in recliner without back support.  Sit to stand from recliner using stedy max A, min A to stand from stedy seat at bed.  Sit to supine max A.  Removed ace wraps, completed light bed level activities with good tolerance.   She repeated herself with conversation more this session than is typical for her - alerted speech therapist to monitor.  She remained in bed at close of session, bed alarm set and call bell in reach.    Therapy Documentation Precautions:  Precautions Precautions: Fall Precaution Comments: seizure precautions Restrictions Weight Bearing Restrictions: No Other Position/Activity Restrictions: head incision  Therapy/Group: Individual Therapy  Carlos Levering 08/12/2020, 7:42 AM

## 2020-08-12 NOTE — Telephone Encounter (Signed)
Copied from [place] [phone]. Topic: General - Other >> Aug 12, 2020  9:34 AM Leward Quan A wrote: Reason for CRM: Lattie Haw with the [facility] called in to inform Dr Caryn Section that a report was faxed over on patient and the finding that will need a follow up and more attention. If fax not received please call Lattie Haw at  Ph# [phone]

## 2020-08-12 NOTE — Consult Note (Signed)
Neuropsychological Consultation   Patient:   Amy [name]   DOB:   [birth year]  MR Number:  [id number]  Location:  [facility] [id number] [place] [address] Dept: [place]: [phone]           Date of Service:   08/12/2020  Start Time:   2:30 PM End Time:   3:30 PM  Provider/Observer:  Ilean Skill, Psy.D.       Clinical Neuropsychologist       Billing Code/Service: 60600  Chief Complaint:    Amy [name] is a 70 year old female with a history of end-stage renal disease on hemodialysis.  Patient with chronic hyponatremia, chronic daily headaches, multiple admissions after recent significant C. difficile infection.  At the time in March when she was initially admitted the patient had new onset seizure likely due to metabolic imbalance, non-STEMI, chronic issues with nausea and vomiting resulting in debility and was admitted to CIR between 3/4-3/24 2022.  Patient was discharged home with reports of a mild fall that afternoon followed by obtundation later that evening.  Patient was evaluated in [facility] [facility] on 07/24/2020 and find to have acute on chronic left subdural hematoma with near complete effacement of left lateral ventricle and 10 mm midline shift.  Patient was transferred to [facility] and underwent left partial craniotomy for evacuation of subdural hematoma with placement of subdural drain which was removed on 3/28.  Patient had worsening aphasia with 2 subclinical seizures therefore Keppra was switched to brivaracetam on 3/27.  Patient had partial motor seizure with facial jerking and difficulty speaking on 3/28 and further prophylactic antiseizure medicines were added with resolution of seizures.  Patient brought back to CIR after transfer from [facility].  Reason for Service:  Patient was referred for neuropsychological consultation due to coping and adjustment issues and ongoing  cognitive issues following her most recent left-sided subdural hematoma.  Below is the HPI for the current admission.  HPI:  [name]. [name] with history of ESRD-on PD, chronic hyponatremia, chronic daily HA, multiple admissions most recent significant for C diff colitis, new onset seizures, NSTEMI s/p PCI w/DAPT, chronic issues with N/V resulting in debility and CIR stay 03/04-03/24/22 . She was discharged to home and reports of mild fall that afternoon followed by obtundation  that evening. She was evaluated at [facility] 07/24/20 and  found to have acute on chronic Left SDH with near complete effacement of left lateral ventricle and 10 mm midline shift. She was transferred to [facility] and underwent left partial craniotomy for evacuation of SDH with placement of subdural drain which was removed on 03/28.   She had worsening of aphasia with 2 subclinical seizures therefore keppra was switched to brivaracetam on 03/27 and she was placed on LTM-EEG. She had partial motor seizure with facial jerking and difficulty speaking on 03/28 and Vimpat added with resolution of seizures-->dose was decreased to 75 mg bid on 03/31 due to sedative SE.    2D echo done 03/26 revealing EF>55% with hypercontractile LV and no masses.  She developed LUE edema and was found to have superficial thrombus involving basilic vein.  BLE dopplers 04/01 were negative for DVT. PD ongoing with chronic hyponateremia-Na-130 as well as ongoing issues with hypotension. Fibrin strands reported in PD and addition of heparin was cleared by NS.  Sepsis work up initiated due to hypotension with leucocytosis and she was treated with Vanc X 4 days/Aztreonam X  5 days due to concerns of UTI.  Cortack briefly placed for nutritional support per RD recommendations  Current Status:  Remembered without cueing we had worked together during her previous hospitalization.  Patient was oriented to being in the hospital and was able to give some details about what it happened  with her medically since I saw her last.  However, she was very groggy with slowed information processing speed and word finding difficulties.  The patient displayed circumlocutions and paraphasic errors.  Patient reports that she has been improving some over the past 24 hours or so but continues to have ongoing cognitive deficits secondary to her subdural hematoma.  Patient also continues to have a number of complicating medical factors and continues with nausea/vomiting.  Behavioral Observation: [name]  presents as a 70 y.o.-year-old Right Caucasian Female who appeared her stated age. her dress was Appropriate and she was Well Groomed and her manners were Appropriate to the situation.  her participation was indicative of Drowsy and Inattentive behaviors.  There were physical disabilities noted.  she displayed an appropriate level of cooperation and motivation.     Interactions:    Minimal Drowsy and Inattentive  Attention:   abnormal and attention span appeared shorter than expected for age  Memory:   abnormal; remote memory intact, recent memory impaired  Visuo-spatial:  not examined  Speech (Volume):  low  Speech:   slurred; word finding difficulties noted.  Thought Process:  Coherent and Relevant  Though Content:  WNL; not suicidal and not homicidal  Orientation:   person, place and situation  Judgment:   Fair  Planning:   Poor  Affect:    Flat and Lethargic  Mood:    Dysphoric  Insight:   Fair  Intelligence:   normal  Medical History:   Past Medical History:  Diagnosis Date  . Anemia    a. 08/2016 Guaiac + stool. EGD w/ gastritis.  . Anxiety   . Closed fracture of shaft of left ulna 11/27/2018  . Colon polyp    a. 08/2016 Colonoscopy.  . ESRD on peritoneal dialysis ([place])   . Gastritis    a. 08/2016 EGD: Gastritis-->PPI.  [name] GERD (gastroesophageal reflux disease)   . History of chicken pox   . History of measles as a child   . Hypertension   . Hyponatremia     a. 08/2016 in setting of HCTZ Rx.  . Mesenteric lymphadenopathy 04/08/2017  . Multiple thyroid nodules          Patient Active Problem List   Diagnosis Date Noted  . SDH (subdural hematoma) ([place]) 08/07/2020  . Postoperative pain   . Acute on chronic anemia   . Subdural hemorrhage following injury ([place]) 08/04/2020  . Orthostasis   . Gastroparesis   . Chronic combined systolic and diastolic congestive heart failure ([place])   . Prolonged Q-T interval on ECG   . Nausea   . Hypoalbuminemia due to protein-calorie malnutrition ([place])   . Nausea and vomiting   . Chronic combined systolic and diastolic CHF (congestive heart failure) ([place])   . Anemia of chronic disease   . Seizures ([place])   . Chronic pain syndrome   . Debility 07/04/2020  . Ischemic cardiomyopathy   . Protein-calorie malnutrition, severe 06/23/2020  . Non-ST elevation (NSTEMI) myocardial infarction ([place])   . Pressure injury of skin 06/21/2020  . C. difficile diarrhea 06/19/2020  . C. difficile colitis 06/19/2020  . Seizure ([place]) 06/19/2020  . Gastroenteritis due to  COVID-19 virus 05/29/2020  . Surgery, elective   . S/P ORIF (open reduction internal fixation) fracture   . Closed right hip fracture, initial encounter ([place]) 04/22/2020  . Hypotension 12/16/2019  . ESRD on dialysis ([place]) 12/16/2019  . Depression with anxiety 12/16/2019  . Hypomagnesemia 12/16/2019  . Atrial flutter ([place])   . Atrial fibrillation with RVR ([place]) 04/03/2019  . Benign essential HTN 04/03/2019  . HLD (hyperlipidemia) 04/03/2019  . End stage renal disease ([place]) 12/13/2018  . Itching 11/08/2018  . Hyperparathyroidism, secondary renal ([place]) 09/13/2017  . Ground glass opacity present on imaging of lung 04/08/2017  . Osteoarthritis of knee 12/29/2016  . Normocytic anemia 10/14/2016  . Intractable episodic headache   . Gastritis   . History of adenomatous polyp of colon 09/07/2016  . Iron deficiency anemia 09/05/2016  . Hyponatremia 09/05/2016   . Multiple thyroid nodules 07/04/2015  . Right thyroid nodule 06/27/2015  . Allergic rhinitis, seasonal 12/03/2014  . Fever blister 12/03/2014  . Herniated lumbar intervertebral disc 12/03/2014  . Hypokalemia 12/03/2014  . Recurrent sinus infections 12/03/2014  . Insomnia 11/05/2014  . Acquired scoliosis 06/26/2014  . Intervertebral disc stenosis of neural canal of lumbar region 06/20/2014  . Lumbar facet arthropathy 05/21/2014  . Degenerative spondylolisthesis 03/12/2014  . OP (osteoporosis) 08/10/2013  . Idiopathic peripheral neuropathy 03/24/2009  . Edema 12/27/2007  . Headache, tension-type 10/25/2006  . Essential hypertension 10/24/2006  . Anxiety 05/03/1998  . Esophageal reflux 05/03/1998     Psychiatric History:  Patient does have a past psychiatric history including issues with anxiety dating back over 20 years.  Patient denies any significant acute exacerbation of her anxiety.  Patient continues on her home medications citalopram as well as taking trazodone at night for sleep.  Family Med/Psych History:  Family History  Problem Relation Age of Onset  . Pancreatic cancer Father   . Congestive Heart Failure Maternal Grandmother   . Cancer Paternal Grandfather   . Heart disease Maternal Uncle     Impression/DX:  RYLYNN KOBS is a 70 year old female with a history of end-stage renal disease on hemodialysis.  Patient with chronic hyponatremia, chronic daily headaches, multiple admissions after recent significant C. difficile infection.  At the time in March when she was initially admitted the patient had new onset seizure likely due to metabolic imbalance, non-STEMI, chronic issues with nausea and vomiting resulting in debility and was admitted to CIR between 3/4-3/24 2022.  Patient was discharged home with reports of a mild fall that afternoon followed by obtundation later that evening.  Patient was evaluated in [facility] [facility] on 07/24/2020 and find to have acute on chronic  left subdural hematoma with near complete effacement of left lateral ventricle and 10 mm midline shift.  Patient was transferred to [facility] and underwent left partial craniotomy for evacuation of subdural hematoma with placement of subdural drain which was removed on 3/28.  Patient had worsening aphasia with 2 subclinical seizures therefore Keppra was switched to brivaracetam on 3/27.  Patient had partial motor seizure with facial jerking and difficulty speaking on 3/28 and further prophylactic antiseizure medicines were added with resolution of seizures.  Patient brought back to CIR after transfer from [facility].  Remembered without cueing we had worked together during her previous hospitalization.  Patient was oriented to being in the hospital and was able to give some details about what it happened with her medically since I saw her last.  However, she was very groggy with slowed information processing speed and  word finding difficulties.  The patient displayed circumlocutions and paraphasic errors.  Patient reports that she has been improving some over the past 24 hours or so but continues to have ongoing cognitive deficits secondary to her subdural hematoma.  Patient also continues to have a number of complicating medical factors and continues with nausea/vomiting.  Disposition/Plan:  Will follow up with the patient first of next week as the patient continues to make slow improvement from her subdural hematoma.  Patient was still groggy with slowed information processing speed and confusion consistent with residual effects of her left hemispheric subdural hematoma.  Diagnosis:    SDH         Electronically Signed   _______________________ Ilean Skill, Psy.D. Clinical Neuropsychologist

## 2020-08-12 NOTE — Progress Notes (Signed)
Speech Language Pathology TBI Note  Patient Details  Name: Amy [name] MRN: [id number] Date of Birth: 1950/08/24  Today's Date: 08/12/2020 SLP Individual Time: 0102-7253 SLP Individual Time Calculation (min): 23 min  Short Term Goals:  Week 2: SLP Short Term Goal 1 (Week 2): Patient will utilize external memory aids to recall new, daily information with Min A verbal cues. SLP Short Term Goal 2 (Week 2): Pt will demonstrate sustained attention in functional tasks in 30 minue intervals with min A verbal cues for redirection. SLP Short Term Goal 3 (Week 2): Pt will demonstrate mildly complex problem solving skills in functional tasks with min A verbal cues. SLP Short Term Goal 4 (Week 2): Pt will demonstrate self-monitor and self-correction in functional errors with min A verbal cues.  Skilled Therapeutic Interventions: Skilled treatment session focused on cognitive goals. Upon arrival, patient was awake in bed and had a container of vomit on her lunch tray. Patient reported that she did not call the nurse to request medications despite ongoing nausea. RN made aware and administered medications. SLP provided education regarding the importance of utilizing the call bell to request assistance. Patient also continues to report decreased sleep but unaware if patient is discussing ongoing issues with physician. Therefore, with extra time and Min verbal cues, patient generated a list of "concerns" for physician that was pinned above her bed to help maximize recall. Patient left upright in bed with alarm on and all needs within reach. Continue with current plan of care.      Pain Pain Assessment Faces Pain Scale: Hurts little more Pain Type: Chronic pain Pain Location: Head Pain Descriptors / Indicators: Headache Pain Onset: On-going Pain Intervention(s): RN made aware  Agitated Behavior Scale: TBI Observation Details Observation Environment: Patient's room Start of observation period - Date:  08/12/20 Start of observation period - Time: 1403 End of observation period - Date: 08/12/20 End of observation period - Time: 1426 Agitated Behavior Scale (DO NOT LEAVE BLANKS) Short attention span, easy distractibility, inability to concentrate: Present to a slight degree Impulsive, impatient, low tolerance for pain or frustration: Absent Uncooperative, resistant to care, demanding: Absent Violent and/or threatening violence toward people or property: Absent Explosive and/or unpredictable anger: Absent Rocking, rubbing, moaning, or other self-stimulating behavior: Absent Pulling at tubes, restraints, etc.: Absent Wandering from treatment areas: Absent Restlessness, pacing, excessive movement: Absent Repetitive behaviors, motor, and/or verbal: Absent Rapid, loud, or excessive talking: Absent Sudden changes of mood: Absent Easily initiated or excessive crying and/or laughter: Absent Self-abusiveness, physical and/or verbal: Absent Agitated behavior scale total score: 15  Therapy/Group: Individual Therapy  Cabella Kimm 08/12/2020, 3:30 PM

## 2020-08-12 NOTE — Telephone Encounter (Signed)
Have you seen any fax on this patient from [facility]?

## 2020-08-12 NOTE — Progress Notes (Signed)
[facility] Progress Note   Subjective:   Patient seen and examined at bedside.  No acute events overnight. Tolerated PD last night with no reported issues. Still has some nausea, unable to drink protein shakes. Otherwise no complaints, discussed with husband as well at the bedside.  Objective Vitals:   08/11/20 1401 08/11/20 1952 08/12/20 0425 08/12/20 0809  BP: 115/63 128/74 120/73 126/71  Pulse: 80 82 73 81  Resp: 19 16 16 16   Temp: 99.7 F (37.6 C) 98.6 F (37 C) 98 F (36.7 C) (!) 97.5 F (36.4 C)  TempSrc: Oral   Oral  SpO2: 99% 100% 100% 100%  Weight:      Height:       Physical Exam General:chroncially ill appearing female in NAD, laying flat in bed Heart:RRR Lungs: CTA bl, speaking in full sentences, unlabored, bl ches texpansion Abdomen:soft, NTND Extremities: no edema Neuro: awake, alert, speech clear and coherent Dialysis Access: PD cath c/d/i  [facility] Weights   08/09/20 0500 08/10/20 0500 08/11/20 0707  Weight: 55.6 kg 52.9 kg 49.4 kg    Intake/Output Summary (Last 24 hours) at 08/12/2020 0942 Last data filed at 08/12/2020 0854 Gross per 24 hour  Intake 1320 ml  Output --  Net 1320 ml    Additional Objective Labs: Basic Metabolic Panel: No results for input(s): NA, K, CL, CO2, GLUCOSE, BUN, CREATININE, CALCIUM, PHOS in the last 168 hours.  Invalid input(s): ALB Liver Function Tests: No results for input(s): AST, ALT, ALKPHOS, BILITOT, PROT, ALBUMIN in the last 168 hours. CBC: Recent Labs  Lab 08/08/20 0512 08/11/20 1138  WBC 10.0 10.4  NEUTROABS 7.7 7.9*  HGB 10.0* 10.7*  HCT 30.4* 33.2*  MCV 97.4 98.2  PLT 336 345   Blood Culture    Component Value Date/Time   SDES BLOOD BLOOD LEFT FOREARM 07/25/2020 0035   SDES BLOOD BLOOD LEFT HAND 07/25/2020 0035   SPECREQUEST  07/25/2020 0035    BOTTLES DRAWN AEROBIC AND ANAEROBIC Blood Culture results may not be optimal due to an inadequate volume of blood received in culture bottles    SPECREQUEST  07/25/2020 0035    BOTTLES DRAWN AEROBIC AND ANAEROBIC Blood Culture adequate volume   CULT  07/25/2020 0035    NO GROWTH 5 DAYS Performed at [facility], [address] [place], [place] [address]    CULT  07/25/2020 0035    NO GROWTH 5 DAYS Performed at [facility], [address] [place], [place] [address]    REPTSTATUS 07/30/2020 FINAL 07/25/2020 0035   REPTSTATUS 07/30/2020 FINAL 07/25/2020 0035    Medications: . dialysis solution 1.5% low-MG/low-CA    . dialysis solution 2.5% low-MG/low-CA     . aspirin EC  81 mg Oral Daily  . atorvastatin  40 mg Oral Daily  . brivaracetam  75 mg Oral BID  . butalbital-acetaminophen-caffeine  1 tablet Oral TID with meals  . citalopram  10 mg Oral Daily  . darbepoetin (ARANESP) injection - NON-DIALYSIS  60 mcg Subcutaneous Q Mon-1800  . docusate sodium  100 mg Oral BID  . feeding supplement (NEPRO CARB STEADY)  237 mL Oral TID AC  . gentamicin cream  1 application Topical Daily  . heparin  5,000 Units Subcutaneous Q12H  . kidney failure book   Does not apply Once  . lacosamide  75 mg Oral BID  . melatonin  5 mg Oral QHS  . metoCLOPramide  5 mg Oral TID AC  . metoprolol tartrate  6.25 mg Oral BID  . multivitamin  1 tablet Oral QHS  . pantoprazole  40 mg Oral Daily  . sodium bicarbonate  650 mg Oral BID  . triamcinolone cream   Topical TID    Dialysis Orders: [place] followed by Dr. Candiss Norse ([place]). 4 exchanges overnight, 2000 cc and 1.5 hrs each.   Assessment/Plan: 1. Debility: 2/2 Multiple med issues, most recently SDH recovery and nstemi w/ CAD and low EF. Readmitted to CIR on 08/04/20. 2. Fall w/ subdural hematoma - sp SDH evacuation at [facility] on3/24/22 3. Nausea/ vomiting - etiology unclear, uremia unlikely. Will check PD cell count 4. Malnutrition/ debility - low albumin, very weak/tired. It has been d/w husband on several occassions in the past about a possible trial  of HD due to low albumin in a PD patient. He had stated they are not interested and feels she would not tolerate HD.  5. Vol overload - sig LE dependent edema and UE edema on admission. Using 1.5% and 2.5% fluids mix w/ nightly UF around 1.5- 2 L. If accurate weight down slightly this AM.  Volume status acceptable, more on the euvolemic side, c/w fluid restriction 6. ESRD -[place]PDin [place], [place], f/b [place] Candiss Norse of [place].Good solute control per labs. Cont PD nightly 4x 2000 cc.   7. Multivessel CAD-  with reduced EF 25-30% by recent cath in Feb 2022. Per TCTSwasnotCABG candidate. UnderwentPCI to LAD. At [facility] repeat echo per family showed improved EF 55%. 8. Chronichyponatremia - stable 4/5. On 1236mL fluid restriction. If starts to worsen again may need to consider NaCl tabs. Monitoring for now 9. Metabolic acidosis - getting bid sod bicarb 10. HTN -BP well controlled on lopressor 6.25mg  BID 9. Anemiaof ESRD-was gettingAranesp 100 MCG q Satwhile herelast admit.  Aranesp r/s'ed, hgb stable 4/8 10. Nutrition:Alb very low, 1.1. Renal diet w/fluid restrictions.Protein supplements.  11. Seizures -per primary team 12. H/oC diff -rx'd in Feb 2022 13. FTT - overall poor prognosis   Gean Quint, MD [place] 08/12/2020,9:42 AM  LOS: 8 days

## 2020-08-12 NOTE — Plan of Care (Signed)
  Problem: RH Memory Goal: LTG Patient will use memory compensatory aids to (SLP) Description: LTG:  Patient will use memory compensatory aids to recall biographical/new, daily complex information with cues (SLP) Flowsheets (Taken 08/12/2020 0619) LTG: Patient will use memory compensatory aids to (SLP): Supervision Note: Goal downgraded due to lack of progress

## 2020-08-12 NOTE — Progress Notes (Signed)
[name] PHYSICAL MEDICINE & REHABILITATION PROGRESS NOTE  Subjective/Complaints: No complaints this morning. Denies pain, constipation, insomnia.   ROS: - Nausea , -vomiting.  Denies CP, SOB, diarrhea  Objective: Vital Signs: Blood pressure 126/71, pulse 81, temperature (!) 97.5 F (36.4 C), temperature source Oral, resp. rate 16, height 5\' 4"  (1.626 m), weight 49.4 kg, SpO2 100 %. No results found. Recent Labs    08/11/20 1138  WBC 10.4  HGB 10.7*  HCT 33.2*  PLT 345   No results for input(s): NA, K, CL, CO2, GLUCOSE, BUN, CREATININE, CALCIUM in the last 72 hours.  Intake/Output Summary (Last 24 hours) at 08/12/2020 1246 Last data filed at 08/12/2020 0854 Gross per 24 hour  Intake 960 ml  Output --  Net 960 ml        Physical Exam: BP 126/71 (BP Location: Right Arm)   Pulse 81   Temp (!) 97.5 F (36.4 C) (Oral)   Resp 16   Ht 5\' 4"  (1.626 m)   Wt 49.4 kg   SpO2 100%   BMI 18.69 kg/m  Gen: no distress, normal appearing HEENT: oral mucosa pink and moist, NCAT Cardio: Reg rate Chest: normal effort, normal rate of breathing Abd: soft, non-distended Ext: no edema Psych: pleasant, normal affect Skin: intact Neuro: Neuro: Alert Motor: LUE: 4-4+/5 proximal distal, unchanged RUE: 4 --4/5 proximal distal, stable LLE: Hip flexion 2/5, knee extension 2/5, ankle dorsiflexion 3/5, improving RLE: Hip flexion 2/5, knee extension 2/5, ankle dorsiflexion 3/5, improving  Assessment/Plan: 1. Functional deficits which require 3+ hours per day of interdisciplinary therapy in a comprehensive inpatient rehab setting.  Physiatrist is providing close team supervision and 24 hour management of active medical problems listed below.  Physiatrist and rehab team continue to assess barriers to discharge/monitor patient progress toward functional and medical goals   Care Tool:  Bathing    Body parts bathed by patient: Right arm,Left arm,Chest,Abdomen,Front perineal  area,Face,Right upper leg,Left upper leg,Buttocks,Right lower leg,Left lower leg   Body parts bathed by helper: Buttocks     Bathing assist Assist Level: Minimal Assistance - Patient > 75%     Upper Body Dressing/Undressing Upper body dressing   What is the patient wearing?: Pull over shirt    Upper body assist Assist Level: Minimal Assistance - Patient > 75%    Lower Body Dressing/Undressing Lower body dressing      What is the patient wearing?: Pants,Incontinence brief     Lower body assist Assist for lower body dressing: Moderate Assistance - Patient 50 - 74%     Toileting Toileting    Toileting assist Assist for toileting: Maximal Assistance - Patient 25 - 49%     Transfers Chair/bed transfer  Transfers assist     Chair/bed transfer assist level: Maximal Assistance - Patient 25 - 49% Chair/bed transfer assistive device: Armrests   Locomotion Ambulation   Ambulation assist   Ambulation activity did not occur: Safety/medical concerns  Assist level: Moderate Assistance - Patient 50 - 74% Assistive device: Parallel bars Max distance: 1   Walk 10 feet activity   Assist  Walk 10 feet activity did not occur: Safety/medical concerns        Walk 50 feet activity   Assist Walk 50 feet with 2 turns activity did not occur: Safety/medical concerns         Walk 150 feet activity   Assist Walk 150 feet activity did not occur: Safety/medical concerns         Walk  10 feet on uneven surface  activity   Assist Walk 10 feet on uneven surfaces activity did not occur: Safety/medical concerns         Wheelchair     Assist Will patient use wheelchair at discharge?: Yes Type of Wheelchair: Manual    Wheelchair assist level: Minimal Assistance - Patient > 75% Max wheelchair distance: 40'    Wheelchair 50 feet with 2 turns activity    Assist    Wheelchair 50 feet with 2 turns activity did not occur: Safety/medical concerns        Wheelchair 150 feet activity     Assist  Wheelchair 150 feet activity did not occur: Safety/medical concerns        Medical Problem List and Plan: 1.  Right hemiparesis and aphasia secondary to large left SDH s/p craniotomy  Continue CIR PT, OT, SLP  2.  Impaired mobility -DVT/anticoagulation:  Mechanical: Continue Sequential compression devices, below knee Bilateral lower extremities             -antiplatelet therapy: N/A due to bleed.  3. Chronic migraines/ Pain Management: Continue Fioricet 3-4 times a day on average.   Controlled with meds on 4/12  Monitor with increased exertion 4. Mood: LCSW to follow for evaluation and support.              -antipsychotic agents: N/A 5. Neuropsych: This patient is not fully capable of making decisions on her own behalf. 6. Skin/Wound Care: Monitor wound for healing.             --offer supplements. Family to provide meals from home as able.  7. Fluids/Electrolytes/Nutrition: Continue PD--does not need fluid restriction.              --has had chronic issues with variable intake as well as N/V likely due to gastroparesis. 8. Chronic Hypotension: Continue TEDs when out of bed             Vitals:   08/12/20 0425 08/12/20 0809  BP: 120/73 126/71  Pulse: 73 81  Resp: 16 16  Temp: 98 F (36.7 C) (!) 97.5 F (36.4 C)  SpO2: 100% 100%  Normotensive on 4/12 9.  ESRD on PD: Nephrology to assist/manage PD.  10.  Recurrent Seizures: Has been seizure free on Brivaracetam and Lacosamide bid.              --Lacosamide was decreased on 03/31 to avoid sedative SE.  No breakthrough seizures since admission-4/10 11. CAD/NSTEMI s/p PCI: Treated medically.   Off DAPT due to bleed             --Continue Lipitor.   Metoprolol low-dose resumed , HR controlled 12. Acute on chronic anemia:   Hemoglobin 10.0 on 4/8  Continue to monitor 13. H/o Depression: Continue Celexa 14. Sick Euthyroid: Will need follow up as illness resolves.               --TSH- 5.80/Free T4-1.2 (03/27) 15. Gastroparesis w/chronic N/V/anorexia: Has been ongoing per records review.              --question failure of PD (husband reports no N/V with brief episode of HD in the past)  Continue Reglan 5 3 times daily, vomiting resolved still has some nausea  Hesitant to continue increase medications due to #18  Will discuss with nephrology 16.  Hyponatremia             --chronic hyponatremia with Na-130-133 range  Sodium 130 on 4/5             --  Sodium bicarb to manage metabolic acidosis. 17.  Severe hypoalbuminemia  Supplement initiated on 4/5, supplement DC'd due to concern for association with nausea/vomiting, however does not appear to have made significant difference 18.  Prolonged QTC  ECG reviewed, improved since last visit  Compazine and dose/frequency decreased    LOS: 8 days A FACE TO FACE EVALUATION WAS PERFORMED  Amy [name] 08/12/2020, 12:46 PM

## 2020-08-12 NOTE — Telephone Encounter (Signed)
No, I haven't gotten anything from [facility]

## 2020-08-12 NOTE — Progress Notes (Signed)
Physical Therapy Weekly Progress Note  Patient Details  Name: Amy [name] MRN: [id number] Date of Birth: May 28, 1950  Beginning of progress report period: August 05, 2020 End of progress report period: August 12, 2020  Today's Date: 08/12/2020 PT Individual Time: 1105-1203 PT Individual Time Calculation (min): 58 min   Patient has met 3 of 4 short term goals.  Patient progressing slowly this week.  She continues to need encouragement every session as feeling weak, nauseated, headache and has frequent stools.  She is progressing slowly able to take a step forward today in parallel bars and sit to stand in bars with mod A and cues and able to transfer bed to chair still with max A.    Patient continues to demonstrate the following deficits muscle weakness, motor apraxia and decreased motor planning and decreased initiation, decreased safety awareness, decreased memory and delayed processing and therefore will continue to benefit from skilled PT intervention to increase functional independence with mobility.  Patient progressing toward long term goals..  Continue plan of care.  PT Short Term Goals Week 1:  PT Short Term Goal 1 (Week 1): Patient to perform bed mobility with mod A. PT Short Term Goal 1 - Progress (Week 1): Met PT Short Term Goal 2 (Week 1): Patient to perform sit to stand with mod A. PT Short Term Goal 2 - Progress (Week 1): Met PT Short Term Goal 3 (Week 1): Bed<>chair transfers with mod A. PT Short Term Goal 3 - Progress (Week 1): Progressing toward goal PT Short Term Goal 4 (Week 1): Patient to initiate ambulation. PT Short Term Goal 4 - Progress (Week 1): Met Week 2:  PT Short Term Goal 1 (Week 2): Patient will perform bed<>chair transfers with mod A. PT Short Term Goal 2 (Week 2): Patient will ambulate 7' in parallel bars with mod A PT Short Term Goal 3 (Week 2): Patient will propel w/c 55' with S. PT Short Term Goal 4 (Week 2): Patient will tolerate standing 1 minute for  functional task.  Skilled Therapeutic Interventions/Progress Updates: Patient in supine and finishing toileting on bedpan with nursing.  Patient supine for Ace wrap application bilateral lower legs for BP management.  Scooted to [facility] with rails and bed in trendelenberg with mod A.  Treading pants over feet with min A sitting up in chair position in bed.  Pulled pants up in supine bridging with mod A and increased time.  Supine to sit with mod A, increased time, step by step cues.  BP taken as noted below and pt with improvement in sitting BP after up 2 minutes 113/67, HR 83. Sitting EOB for BP management and LE therex for AP's, LAQ, hip flexion all x 5 with S.  Patient transferred to w/c with mod to max A cues for hand placement.  Patient pushed in w/c to therapy gym.  Performed sit to stand in parallel bars x 2 with mod A and cues (bracing L knee).  Patient took one step forward with min to mod A and cues then one step back to sit in chair.  Patient assisted in w/c to room.  Sit to stand to [name] with mod A (+2 for safety) and transferred to recliner.  Left sitting with call bell and needs in reach.  Encouraged to sit up till OT at 1 pm.  Ambulation/gait training;Balance/vestibular training;Cognitive remediation/compensation;Functional mobility training;Patient/family education;Therapeutic Exercise;UE/LE Coordination activities;Splinting/orthotics;Therapeutic Activities;Skin care/wound management;Pain management;Discharge planning;DME/adaptive equipment instruction;Neuromuscular re-education;Stair training;UE/LE Strength taining/ROM;Wheelchair propulsion/positioning   Therapy  Documentation Precautions:  Precautions Precautions: Fall Precaution Comments: seizure precautions Restrictions Weight Bearing Restrictions: No Other Position/Activity Restrictions: head incision General:   Vital Signs:   Orthostatic VS for the past 24 hrs (Last 3 readings):  BP- Lying Pulse- Lying BP- Sitting Pulse-  Sitting  08/12/20 1100 120/64 71 99/65 87   Pain: Pain Assessment Faces Pain Scale: Hurts little more Pain Type: Chronic pain Pain Location: Head Pain Descriptors / Indicators: Headache Pain Onset: On-going Pain Intervention(s): RN made aware  Therapy/Group: Individual Therapy  Reginia Naas  [place], [place] 08/12/2020, 12:22 PM

## 2020-08-13 DIAGNOSIS — S065X9A Traumatic subdural hemorrhage with loss of consciousness of unspecified duration, initial encounter: Secondary | ICD-10-CM | POA: Diagnosis not present

## 2020-08-13 LAB — CBC
HCT: 33.4 % — ABNORMAL LOW (ref 36.0–46.0)
Hemoglobin: 10.8 g/dL — ABNORMAL LOW (ref 12.0–15.0)
MCH: 32 pg (ref 26.0–34.0)
MCHC: 32.3 g/dL (ref 30.0–36.0)
MCV: 98.8 fL (ref 80.0–100.0)
Platelets: 348 10*3/uL (ref 150–400)
RBC: 3.38 MIL/uL — ABNORMAL LOW (ref 3.87–5.11)
RDW: 15.6 % — ABNORMAL HIGH (ref 11.5–15.5)
WBC: 8.2 10*3/uL (ref 4.0–10.5)
nRBC: 0 % (ref 0.0–0.2)

## 2020-08-13 LAB — RENAL FUNCTION PANEL
Albumin: 1.2 g/dL — ABNORMAL LOW (ref 3.5–5.0)
Anion gap: 10 (ref 5–15)
BUN: 24 mg/dL — ABNORMAL HIGH (ref 8–23)
CO2: 27 mmol/L (ref 22–32)
Calcium: 8.4 mg/dL — ABNORMAL LOW (ref 8.9–10.3)
Chloride: 92 mmol/L — ABNORMAL LOW (ref 98–111)
Creatinine, Ser: 2.68 mg/dL — ABNORMAL HIGH (ref 0.44–1.00)
GFR, Estimated: 18 mL/min — ABNORMAL LOW (ref >60)
Glucose, Bld: 117 mg/dL — ABNORMAL HIGH (ref 70–99)
Phosphorus: 3.6 mg/dL (ref 2.5–4.6)
Potassium: 2.6 mmol/L — CL (ref 3.5–5.1)
Sodium: 129 mmol/L — ABNORMAL LOW (ref 135–145)

## 2020-08-13 LAB — POTASSIUM: Potassium: 3.7 mmol/L (ref 3.5–5.1)

## 2020-08-13 LAB — BODY FLUID CELL COUNT WITH DIFFERENTIAL: Total Nucleated Cell Count, Fluid: 6 cu mm (ref 0–1000)

## 2020-08-13 LAB — MAGNESIUM: Magnesium: 1.7 mg/dL (ref 1.7–2.4)

## 2020-08-13 MED ORDER — POTASSIUM CHLORIDE 20 MEQ PO PACK
40.0000 meq | PACK | Freq: Two times a day (BID) | ORAL | Status: DC
  Administered 2020-08-13: 40 meq via ORAL
  Filled 2020-08-13: qty 2

## 2020-08-13 NOTE — Progress Notes (Signed)
Occupational Therapy TBI Note  Patient Details  Name: Amy [name] MRN: [id number] Date of Birth: [birth year]  Today's Date: 08/13/2020 OT Individual Time: 1004-1059 OT Individual Time Calculation (min): 55 min    Short Term Goals: Week 1:  OT Short Term Goal 1 (Week 1): patient will complete LB adl bed level with mod A OT Short Term Goal 1 - Progress (Week 1): Met OT Short Term Goal 2 (Week 1): Patient will complete UB adl w/c level with min A OT Short Term Goal 2 - Progress (Week 1): Met OT Short Term Goal 3 (Week 1): Patient will complete bed mobility with mod A OT Short Term Goal 3 - Progress (Week 1): Progressing toward goal OT Short Term Goal 4 (Week 1): patient will complete functional sit or stand pivot transfer with mod A OT Short Term Goal 4 - Progress (Week 1): Progressing toward goal  Skilled Therapeutic Interventions/Progress Updates:    1;1. Pt received in bed agreeable to OT but once up reports, "I dont think I can do this today." encouragement provided and pt agreeable to getting dressed at EOB. Pt requires significnalty increased time to process, motor plan and execute all commands for dressing. Pt requries MIN A with OT in tall kneeling behind pt providing multimodal cuing for midline orientation d/t R lean. Clothing items placed on L to encourage leaning L for midline orientation. Pt able to change shirt with no A for steps of dressing. Pt able to thread RLE into pants with A for LE crossing into figure 4, but pt able to maintain and thread pants. Pt reporting feeling dizzy and needs to lie down. Pt cued on step by step process but gets lost/internally distracted once BLE into bed. Pt bridges hips with MIN A to scoot up to [facility].  Supine BP: 107/69  Sup>sit with MAX A to finish LB dressing threading BLE and donning B socks (MOD A overall)  Seated EOB BP: 93/60 After a few min EOB 107/70 Rolling/scooting to [facility] for advancing pants past hips as stated above. Exited session  with pt seated in bed, exit alarm on and call light in reach.   Therapy Documentation Precautions:  Precautions Precautions: Fall Precaution Comments: seizure precautions Restrictions Weight Bearing Restrictions: No Other Position/Activity Restrictions: head incision General:   Vital Signs: Therapy Vitals Temp: 98.5 F (36.9 C) Temp Source: Oral Pulse Rate: 68 Resp: 16 BP: (!) 101/58 Patient Position (if appropriate): Lying Oxygen Therapy SpO2: 97 % O2 Device: Room Air Pain: Pain Assessment Pain Scale: 0-10 Pain Score: 0-No pain Agitated Behavior Scale: TBI  Observation Details Observation Environment: pt room Start of observation period - Date: 08/13/20 Start of observation period - Time: 1000 End of observation period - Date: 08/13/20 End of observation period - Time: 1100 Agitated Behavior Scale (DO NOT LEAVE BLANKS) Short attention span, easy distractibility, inability to concentrate: Present to a moderate degree Impulsive, impatient, low tolerance for pain or frustration: Absent Uncooperative, resistant to care, demanding: Absent Violent and/or threatening violence toward people or property: Absent Explosive and/or unpredictable anger: Absent Rocking, rubbing, moaning, or other self-stimulating behavior: Absent Pulling at tubes, restraints, etc.: Absent Wandering from treatment areas: Absent Restlessness, pacing, excessive movement: Absent Repetitive behaviors, motor, and/or verbal: Absent Rapid, loud, or excessive talking: Absent Sudden changes of mood: Absent Easily initiated or excessive crying and/or laughter: Absent Self-abusiveness, physical and/or verbal: Absent Agitated behavior scale total score: 16  Vision   Perception    Praxis   Exercises:  Other Treatments:     Therapy/Group: Individual Therapy  Tonny Branch 08/13/2020, 10:10 AM

## 2020-08-13 NOTE — Progress Notes (Signed)
[facility] PHYSICAL MEDICINE & REHABILITATION PROGRESS NOTE  Subjective/Complaints: K+ 2.9 today: supplemented by nephro and will repeat today Limited with activity, needs a lot of encouragement to participate Right leg limits transfers +headache  ROS: - Nausea , -vomiting.  Denies CP, SOB, diarrhea, +headache  Objective: Vital Signs: Blood pressure (!) 101/58, pulse 68, temperature 98.5 F (36.9 C), temperature source Oral, resp. rate 16, height 5\' 4"  (1.626 m), weight 49.4 kg, SpO2 97 %. No results found. Recent Labs    08/11/20 1138 08/13/20 0739  WBC 10.4 8.2  HGB 10.7* 10.8*  HCT 33.2* 33.4*  PLT 345 348   Recent Labs    08/13/20 0739  NA 129*  K 2.6*  CL 92*  CO2 27  GLUCOSE 117*  BUN 24*  CREATININE 2.68*  CALCIUM 8.4*    Intake/Output Summary (Last 24 hours) at 08/13/2020 1033 Last data filed at 08/13/2020 0900 Gross per 24 hour  Intake 597 ml  Output --  Net 597 ml        Physical Exam: BP (!) 101/58 (BP Location: Right Arm)   Pulse 68   Temp 98.5 F (36.9 C) (Oral)   Resp 16   Ht 5\' 4"  (1.626 m)   Wt 49.4 kg   SpO2 97%   BMI 18.69 kg/m  Gen: no distress, normal appearing HEENT: oral mucosa pink and moist, NCAT Cardio: Reg rate Chest: normal effort, normal rate of breathing Abd: soft, non-distended Ext: no edema Psych: pleasant, normal affect Skin: intact Neuro: Alert Motor: LUE: 4-4+/5 proximal distal, unchanged RUE: 4 --4/5 proximal distal, stable LLE: Hip flexion 2/5, knee extension 2/5, ankle dorsiflexion 3/5, improving RLE: Hip flexion 2/5, knee extension 2/5, ankle dorsiflexion 3/5, improving  Assessment/Plan: 1. Functional deficits which require 3+ hours per day of interdisciplinary therapy in a comprehensive inpatient rehab setting.  Physiatrist is providing close team supervision and 24 hour management of active medical problems listed below.  Physiatrist and rehab team continue to assess barriers to discharge/monitor  patient progress toward functional and medical goals   Care Tool:  Bathing    Body parts bathed by patient: Right arm,Left arm,Chest,Abdomen,Front perineal area,Face,Right upper leg,Left upper leg,Buttocks,Right lower leg,Left lower leg   Body parts bathed by helper: Buttocks     Bathing assist Assist Level: Minimal Assistance - Patient > 75%     Upper Body Dressing/Undressing Upper body dressing   What is the patient wearing?: Pull over shirt    Upper body assist Assist Level: Minimal Assistance - Patient > 75%    Lower Body Dressing/Undressing Lower body dressing      What is the patient wearing?: Pants,Incontinence brief     Lower body assist Assist for lower body dressing: Moderate Assistance - Patient 50 - 74%     Toileting Toileting    Toileting assist Assist for toileting: Maximal Assistance - Patient 25 - 49%     Transfers Chair/bed transfer  Transfers assist     Chair/bed transfer assist level: Maximal Assistance - Patient 25 - 49% Chair/bed transfer assistive device: Armrests   Locomotion Ambulation   Ambulation assist   Ambulation activity did not occur: Safety/medical concerns  Assist level: Moderate Assistance - Patient 50 - 74% Assistive device: Parallel bars Max distance: 1   Walk 10 feet activity   Assist  Walk 10 feet activity did not occur: Safety/medical concerns        Walk 50 feet activity   Assist Walk 50 feet with 2 turns activity  did not occur: Safety/medical concerns         Walk 150 feet activity   Assist Walk 150 feet activity did not occur: Safety/medical concerns         Walk 10 feet on uneven surface  activity   Assist Walk 10 feet on uneven surfaces activity did not occur: Safety/medical concerns         Wheelchair     Assist Will patient use wheelchair at discharge?: Yes Type of Wheelchair: Manual    Wheelchair assist level: Minimal Assistance - Patient > 75% Max wheelchair  distance: 22'    Wheelchair 50 feet with 2 turns activity    Assist    Wheelchair 50 feet with 2 turns activity did not occur: Safety/medical concerns       Wheelchair 150 feet activity     Assist  Wheelchair 150 feet activity did not occur: Safety/medical concerns        Medical Problem List and Plan: 1.  Right hemiparesis and aphasia secondary to large left SDH s/p craniotomy  Continue CIR PT, OT, SLP  -Interdisciplinary Team Conference today   2.  Impaired mobility -DVT/anticoagulation:  Mechanical: Continue Sequential compression devices, below knee Bilateral lower extremities             -antiplatelet therapy: N/A due to bleed.  3. Chronic migraines/ Pain Management: Continue Fioricet 3-4 times a day on average.   Controlled with meds on 4/13  Monitor with increased exertion 4. Mood: LCSW to follow for evaluation and support.              -antipsychotic agents: N/A 5. Neuropsych: This patient is not fully capable of making decisions on her own behalf. 6. Skin/Wound Care: Monitor wound for healing.             --offer supplements. Family to provide meals from home as able.  7. Fluids/Electrolytes/Nutrition: Continue PD--does not need fluid restriction.              --has had chronic issues with variable intake as well as N/V likely due to gastroparesis. 8. Chronic Hypotension: Continue TEDs when out of bed             Vitals:   08/13/20 0300 08/13/20 0900  BP: 114/63 (!) 101/58  Pulse: 70 68  Resp: 16 16  Temp: 97.8 F (36.6 C) 98.5 F (36.9 C)  SpO2: 100% 97%  Normotensive on 4/12 9.  ESRD on PD: Nephrology to assist/manage PD.  10.  Recurrent Seizures: Has been seizure free on Brivaracetam and Lacosamide bid.              --Lacosamide was decreased on 03/31 to avoid sedative SE.  No breakthrough seizures since admission-4/10 11. CAD/NSTEMI s/p PCI: Treated medically.   Off DAPT due to bleed             --Continue Lipitor.   Metoprolol low-dose  resumed , HR controlled 12. Acute on chronic anemia:   Hemoglobin 10.0 on 4/8  Continue to monitor 13. H/o Depression: Continue Celexa 14. Sick Euthyroid: Will need follow up as illness resolves.              --TSH- 5.80/Free T4-1.2 (03/27) 15. Gastroparesis w/chronic N/V/anorexia: Has been ongoing per records review.              --question failure of PD (husband reports no N/V with brief episode of HD in the past)  Continue Reglan 5 3 times daily, vomiting  resolved still has some nausea  Hesitant to continue increase medications due to #18  Will discuss with nephrology 16.  Hyponatremia             --chronic hyponatremia with Na-130-133 range  Sodium 130 on 4/5             --Sodium bicarb to manage metabolic acidosis. 17.  Severe hypoalbuminemia  Supplement d/ced given nausea and vomiting- recommended nuts as source of protein 18.  Prolonged QTC  ECG reviewed, improved since last visit  Compazine and dose/frequency decreased    LOS: 9 days A FACE TO FACE EVALUATION WAS PERFORMED  Clide Deutscher Azora Bonzo 08/13/2020, 10:33 AM

## 2020-08-13 NOTE — Progress Notes (Signed)
Critical lab value called from lab. Made primary RN and PA aware of potassium value.

## 2020-08-13 NOTE — Progress Notes (Signed)
Physical Therapy Session Note  Patient Details  Name: Amy [name] MRN: [id number] Date of Birth: 24-May-1950  Today's Date: 08/13/2020 PT Individual Time: 1320-1400 PT Individual Time Calculation (min): 40 min   Short Term Goals: Week 2:  PT Short Term Goal 1 (Week 2): Patient will perform bed<>chair transfers with mod A. PT Short Term Goal 2 (Week 2): Patient will ambulate 7' in parallel bars with mod A PT Short Term Goal 3 (Week 2): Patient will propel w/c 5' with S. PT Short Term Goal 4 (Week 2): Patient will tolerate standing 1 minute for functional task.  Skilled Therapeutic Interventions/Progress Updates:    Patient in supine finishing lunch and encourage to eat Magic Cup for protein.  RN made aware need for medication for headache. Missed 20 minutes of skilled PT due to eating lunch.  Noted pt on bed pan so pt rolled with min to mod A and total A for hygiene, removing bed pan and max A to don brief.  Patient supine to sit with mod/max A and seated EOB for continuing to eat magic cup with S for sitting balance.  Seated about 5 minutes and pt c/o needing to "lay back".  Encouraged and assisted to scoot to [facility] in sitting with mod A x 4 reps.  Sit to supine mod A for LE's.  Patient in supine to perform therex including hooklying hip flexion/marching, bridging w/ 5 sec hold, hip adductor squeezes w/ 5 sec hold, lateral trunk rotation and hooklying hip abduction with orange t-band all x 10.  Patient assisted with bed in chair position and bed alarm active, needs in reach end of session.   Therapy Documentation Precautions:  Precautions Precautions: Fall Precaution Comments: seizure precautions Restrictions Weight Bearing Restrictions: No Other Position/Activity Restrictions: head incision General: PT Amount of Missed Time (min): 20 Minutes PT Missed Treatment Reason: Other (Comment) (eating lunch) Pain: Pain Assessment Pain Score: 8  Pain Type: Chronic pain Pain Location:  Head Pain Descriptors / Indicators: Headache Pain Onset: On-going Pain Intervention(s): Repositioned;Distraction;RN made aware   Therapy/Group: Individual Therapy  Reginia Naas  [place], PT 08/13/2020, 1:19 PM

## 2020-08-13 NOTE — Progress Notes (Signed)
[facility] Progress Note   Subjective:   Patient seen and examined at bedside.  No acute events. Trying to eat more protein. Daughter at bedside today. She is trying to get her protein through nuts. Remind them to be cognizant of phosphorus. Otherwise no complaints, did well with PD yesterday. Net uf 1419cc.  Objective Vitals:   08/12/20 1731 08/12/20 1952 08/13/20 0300 08/13/20 0900  BP: 125/88 109/69 114/63 (!) 101/58  Pulse: 78 81 70 68  Resp: 16 16 16 16   Temp: 99 F (37.2 C) 97.9 F (36.6 C) 97.8 F (36.6 C) 98.5 F (36.9 C)  TempSrc: Oral  Oral Oral  SpO2: 100% 99% 100% 97%  Weight:      Height:       Physical Exam General:chroncially ill appearing female in NAD, laying flat in bed Heart:RRR Lungs: CTA bl, speaking in full sentences, unlabored, bl ches texpansion Abdomen:soft, NTND Extremities: no edema Neuro: awake, alert, speech clear and coherent Dialysis Access: PD cath c/d/i  [facility] Weights   08/09/20 0500 08/10/20 0500 08/11/20 0707  Weight: 55.6 kg 52.9 kg 49.4 kg    Intake/Output Summary (Last 24 hours) at 08/13/2020 1009 Last data filed at 08/13/2020 0900 Gross per 24 hour  Intake 597 ml  Output --  Net 597 ml    Additional Objective Labs: Basic Metabolic Panel: No results for input(s): NA, K, CL, CO2, GLUCOSE, BUN, CREATININE, CALCIUM, PHOS in the last 168 hours.  Invalid input(s): ALB Liver Function Tests: No results for input(s): AST, ALT, ALKPHOS, BILITOT, PROT, ALBUMIN in the last 168 hours. CBC: Recent Labs  Lab 08/08/20 0512 08/11/20 1138 08/13/20 0739  WBC 10.0 10.4 8.2  NEUTROABS 7.7 7.9*  --   HGB 10.0* 10.7* 10.8*  HCT 30.4* 33.2* 33.4*  MCV 97.4 98.2 98.8  PLT 336 345 348   Blood Culture    Component Value Date/Time   SDES BLOOD BLOOD LEFT FOREARM 07/25/2020 0035   SDES BLOOD BLOOD LEFT HAND 07/25/2020 0035   SPECREQUEST  07/25/2020 0035    BOTTLES DRAWN AEROBIC AND ANAEROBIC Blood Culture results may not  be optimal due to an inadequate volume of blood received in culture bottles   SPECREQUEST  07/25/2020 0035    BOTTLES DRAWN AEROBIC AND ANAEROBIC Blood Culture adequate volume   CULT  07/25/2020 0035    NO GROWTH 5 DAYS Performed at [facility], [address] [place], [place] [address]    CULT  07/25/2020 0035    NO GROWTH 5 DAYS Performed at [facility], [address] [place],  [address]    REPTSTATUS 07/30/2020 FINAL 07/25/2020 0035   REPTSTATUS 07/30/2020 FINAL 07/25/2020 0035    Medications: . dialysis solution 1.5% low-MG/low-CA    . dialysis solution 2.5% low-MG/low-CA     . aspirin EC  81 mg Oral Daily  . atorvastatin  40 mg Oral Daily  . brivaracetam  75 mg Oral BID  . butalbital-acetaminophen-caffeine  1 tablet Oral TID with meals  . citalopram  10 mg Oral Daily  . darbepoetin (ARANESP) injection - NON-DIALYSIS  60 mcg Subcutaneous Q Tue-1800  . docusate sodium  100 mg Oral BID  . gentamicin cream  1 application Topical Daily  . heparin  5,000 Units Subcutaneous Q12H  . kidney failure book   Does not apply Once  . lacosamide  75 mg Oral BID  . melatonin  5 mg Oral QHS  . metoCLOPramide  5 mg Oral TID AC  .  metoprolol tartrate  6.25 mg Oral BID  . multivitamin  1 tablet Oral QHS  . pantoprazole  40 mg Oral Daily  . sodium bicarbonate  650 mg Oral BID  . triamcinolone cream   Topical TID    Dialysis Orders: [place] followed by Dr. Candiss Norse ([place]). 4 exchanges overnight, 2000 cc and 1.5 hrs each.   Assessment/Plan: 1. Debility: 2/2 Multiple med issues, most recently SDH recovery and nstemi w/ CAD and low EF. Readmitted to CIR on 08/04/20. 2. Fall w/ subdural hematoma - sp SDH evacuation at [facility] on3/24/22 3. Nausea/ vomiting - etiology unclear, uremia unlikely. Will check PD cell count 4. Malnutrition/ debility - low albumin, very weak/tired. It has been d/w husband on several occassions in the past about a  possible trial of HD due to low albumin in a PD patient. He had stated in the past they are not interested and feels she would not tolerate HD.  5. Vol overload - sig LE dependent edema and UE edema on admission. Using 1.5% and 2.5% fluids mix.  Volume status significantly improved, more on the euvolemic side now 6. ESRD -[facility] [place], [place], f/b Dr Candiss Norse of [place].Good solute control per labs. Cont PD nightly 4x 2000 cc.  Cell count/culture+gram stain given persistent ongoing nausea. ESRD/CKD labs ordered for today (RFP, CBC, PTH) 7. Multivessel CAD-  with reduced EF 25-30% by recent cath in Feb 2022. Per TCTSwasnotCABG candidate. UnderwentPCI to LAD. At [facility] repeat echo per family showed improved EF 55%. 8. Chronichyponatremia - stable 4/5. On 125mL fluid restriction. If starts to worsen again may need to consider NaCl tabs. Monitoring for now, awaiting today's labs, encourage solute intake 9. Metabolic acidosis - getting bid sod bicarb 10. HTN -BP well controlled on lopressor 6.25mg  BID 9. Anemiaof ESRD-was gettingAranesp 100 MCG q Satwhile herelast admit.  Aranesp r/s'ed, hgb stable 4/13 10. Nutrition:Alb very low, 1.1. Renal diet w/fluid restrictions.Protein supplements.  11. Seizures -per primary team 12. H/oC diff -rx'd in Feb 2022 13. FTT - overall poor prognosis   Gean Quint, MD [facility] 08/13/2020,10:09 AM  LOS: 9 days

## 2020-08-13 NOTE — Patient Care Conference (Signed)
Inpatient RehabilitationTeam Conference and Plan of Care Update Date: 08/13/2020   Time: 11:36 AM    Patient Name: Amy [name]      Medical Record Number: [id number]  Date of Birth: 1951/02/20 Sex: Female         Room/Bed: 4W06C/4W06C-01 Payor Info: Payor: MEDICARE / Plan: MEDICARE PART A AND B / Product Type: *No Product type* /    Admit Date/Time:  08/04/2020  1:38 PM  Primary Diagnosis:  SDH (subdural hematoma) [facility])  Hospital Problems: Principal Problem:   SDH (subdural hematoma) ([place]) Active Problems:   Subdural hemorrhage following injury ([place])   Acute on chronic anemia   Postoperative pain    Expected Discharge Date: Expected Discharge Date: 09/02/20  Team Members Present: Physician leading conference: Dr. Leeroy Cha Care Coodinator Present: Dorien Chihuahua, RN, BSN, CRRN;Becky Dupree, LCSW Nurse Present: Dorien Chihuahua, RN PT Present: Magda Kiel, PT OT Present: Lillia Corporal, OT SLP Present: Nadara Mode, SLP PPS Coordinator present : Gunnar Fusi, SLP     Current Status/Progress Goal Weekly Team Focus  Bowel/Bladder   Pt is continent x2.  Pt will remain continent x2.  Assess q shift and prn.   Swallow/Nutrition/ Hydration             ADL's   min A bed level adl, edema improved, ongoing c/o nausea and needs encouragement for all activity.  sit to stand bed side with max A  min A/CS  adl/transfer training, general conditioning, balance, upright tolerance   Mobility   mod A sit to stand in bars, mod to max A squat pivot to w/c, propels w/c 40' with min A, Took one step forward and one back in bars  CGA to S overall  progression of activity day to day, encouragement, strength, sitting balance   Communication             Safety/Cognition/ Behavioral Observations  Mod A  Supervision  mildly complex problem solving, recall with use of strategies, attention   Pain   Pt reports chronic headaches; managed on current regimen.  Pain will remain <3.  Assess q shift  and prn.   Skin   Bilateral bruising to all extremities, BUE edems and weeping, redness to buttocks, PD abdomen cath.  Skin will continue to heal and remain infection free.  Assess q shift and prn.     Discharge Planning:  Home with husband and hired assist, husband aware will need 24/7 physical care at DC.   Team Discussion: Chronic HA treated with Fioricet. Nausea and poor appetite; magic cups and supplements added. Hgb improved. Progress limited by fatigue, limited activity intolerance and requires encouragement to participate in therapy. Staff anticipate patient will ambulate very little at discharge.  Patient on target to meet rehab goals: Currently min assist for ADLs at bed level. Max assist for sit- stand and squat pivot transfers. Min assist goals set for discharge.  *See Care Plan and progress notes for long and short-term goals.   Revisions to Treatment Plan:  Downgraded goals Working on self monitoring,  error correction and memory  Teaching Needs: Transfers, toileting, medication/dietary management, etc.   Current Barriers to Discharge: Decreased caregiver support, Home enviroment access/layout and Peritoneal Dialysis  Possible Resolutions to Barriers: Family education Private duty caregivers to assist spouse with 24/7 care     Medical Summary Current Status: headaches, edema improving, nausea especially with protein supplements, severe hyokalemia  Barriers to Discharge: Medical stability  Barriers to Discharge Comments: severe hypokalemia, nausea  especially with protein supplements, insomnia, headaches Possible Resolutions to [employer] Focus: Therapies, follow labs - K, Na+, monitor for seizures, optimize meds for N/V in accordance with QTc, supplement K+, d/c protein supplements, continue fioricet   Continued Need for Acute Rehabilitation Level of Care: The patient requires daily medical management by a physician with specialized training in physical  medicine and rehabilitation for the following reasons: Direction of a multidisciplinary physical rehabilitation program to maximize functional independence : Yes Medical management of patient stability for increased activity during participation in an intensive rehabilitation regime.: Yes Analysis of laboratory values and/or radiology reports with any subsequent need for medication adjustment and/or medical intervention. : Yes   I attest that I was present, lead the team conference, and concur with the assessment and plan of the team.   Dorien Chihuahua B 08/13/2020, 2:33 PM

## 2020-08-13 NOTE — Progress Notes (Signed)
Speech Language Pathology Daily Session Note  Patient Details  Name: Amy [name] MRN: [id number] Date of Birth: [birth year]  Today's Date: 08/13/2020 SLP Individual Time: 8338-2505 SLP Individual Time Calculation (min): 45 min  Short Term Goals: Week 2: SLP Short Term Goal 1 (Week 2): Patient will utilize external memory aids to recall new, daily information with Min A verbal cues. SLP Short Term Goal 2 (Week 2): Pt will demonstrate sustained attention in functional tasks in 30 minue intervals with min A verbal cues for redirection. SLP Short Term Goal 3 (Week 2): Pt will demonstrate mildly complex problem solving skills in functional tasks with min A verbal cues. SLP Short Term Goal 4 (Week 2): Pt will demonstrate self-monitor and self-correction in functional errors with min A verbal cues.  Skilled Therapeutic Interventions:   Patient seen for skilled ST session with focus on cognitive function goals. Patient was oriented to date using calendar and was correct despite yesterday's date not marked off. She performed novel basic level attention task of placing colored blocks into 12 field pattern without cues for 100% accuracy. When reviewing therapy schedule and memory book, patient only able to recall working with "Cyndi". When SLP read aloud parts of note from OT session this morning, patient did appear to recall this task but did not demonstrate awareness or recall of how difficult and time consuming it was for getting dressed. Patient did not recall OT session and instead continued to say that Cyndi (PT) helped her get dressed. Patient continues to benefit from skilled SLP intervention to maximize cognitive function goals prior to discharge.  Pain Pain Assessment Pain Scale: 0-10 Pain Score: 0-No pain Pain Type: Chronic pain Pain Location: Head Pain Descriptors / Indicators: Headache Pain Onset: On-going Pain Intervention(s): Repositioned;Distraction;RN made aware  Therapy/Group:  Individual Therapy  Sonia Baller, MA, CCC-SLP Speech Therapy

## 2020-08-13 NOTE — Treatment Plan (Signed)
Informed of critical result from staff. RFP from this AM revealed a K of 2.6, Na 129. Will order kcl 33meq BID for today (K repletion can help with hyponatremia). Repeat K with Mag at 4pm today. Repeat RFP tomorrow. If persistently low, then may just need standing KCl supplements on a daily basis. Her low PO intake seems to be the culprit here along with PD. Orders placed.  Gean Quint, MD [facility]

## 2020-08-13 NOTE — Plan of Care (Signed)
  Problem: Consults Goal: RH BRAIN INJURY PATIENT EDUCATION Description: Description: See Patient Education module for eduction specifics Outcome: Progressing   Problem: RH SKIN INTEGRITY Goal: RH STG SKIN FREE OF INFECTION/BREAKDOWN Description: With min assist  Outcome: Progressing Goal: RH STG MAINTAIN SKIN INTEGRITY WITH ASSISTANCE Description: STG Maintain Skin Integrity With min  Assistance. Outcome: Progressing   Problem: RH SAFETY Goal: RH STG ADHERE TO SAFETY PRECAUTIONS W/ASSISTANCE/DEVICE Description: STG Adhere to Safety Precautions With cues/reminders Assistance/Device. Outcome: Progressing   Problem: RH COGNITION-NURSING Goal: RH STG USES MEMORY AIDS/STRATEGIES W/ASSIST TO PROBLEM SOLVE Description: STG Uses Memory Aids/Strategies With cues/reminders Assistance to Problem Solve. Outcome: Progressing Goal: RH STG ANTICIPATES NEEDS/CALLS FOR ASSIST W/ASSIST/CUES Description: STG Anticipates Needs/Calls for Assist With reminders/Cues. Outcome: Progressing   Problem: RH PAIN MANAGEMENT Goal: RH STG PAIN MANAGED AT OR BELOW PT'S PAIN GOAL Description: At or below level 4 Outcome: Progressing   Problem: RH KNOWLEDGE DEFICIT BRAIN INJURY Goal: RH STG INCREASE KNOWLEDGE OF SELF CARE AFTER BRAIN INJURY Description: Patient and spouse will be able to manage care at discharge using handouts and educational materials independently Outcome: Progressing

## 2020-08-13 NOTE — Progress Notes (Signed)
Patient ID: Amy [name], female   DOB: 30-Jul-1950, 70 y.o.   MRN: [id number]  Met with pt and left message with husband regarding team conference update and pt's progress. Pt does not like the target discharge date of 5/3. She wants to go home sooner, discussed she will need to participate and work to meet her goals of min assist before this. She is tired of being in a hospital which is understandable. Will await husband's input to see what he is thinking about this. She is aware meet every Wednesday and discuss progress and if all agreeable and feel can be managed at home can move up date as long as medically stable. Pt does not realize how much care she is when told her she is max sit to stand she was surprised by this. Await husband's call.

## 2020-08-13 NOTE — Progress Notes (Signed)
Physical Therapy Session Note  Patient Details  Name: Amy [name] MRN: [id number] Date of Birth: 08/17/1950  Today's Date: 08/13/2020 PT Missed Time: 80 Minutes Missed Time Reason: Patient fatigue;Patient ill (Comment)  Pt received supine in bed and upon encouraging pt for OOB mobility to recliner pt politely, but adamantly stated "no." Pt goes on to explain that she has been experiencing increasing "nausea" since lunch time stating "I just don't feel good." Pt declines therapy at this time. Pt had called RN earlier to remove B LE ACE wraps prior to therapist's arrival. With RN approval, therapist provided pt with ginger ale and crackers due to nausea. Pt left supine in bed as found. Missed 30 minutes of skilled physical therapy.  Tawana Scale , PT, DPT, CSRS  08/13/2020, 4:06 PM

## 2020-08-14 DIAGNOSIS — S065X9A Traumatic subdural hemorrhage with loss of consciousness of unspecified duration, initial encounter: Secondary | ICD-10-CM | POA: Diagnosis not present

## 2020-08-14 LAB — RENAL FUNCTION PANEL
Albumin: 1.1 g/dL — ABNORMAL LOW (ref 3.5–5.0)
Anion gap: 7 (ref 5–15)
BUN: 23 mg/dL (ref 8–23)
CO2: 28 mmol/L (ref 22–32)
Calcium: 8.2 mg/dL — ABNORMAL LOW (ref 8.9–10.3)
Chloride: 92 mmol/L — ABNORMAL LOW (ref 98–111)
Creatinine, Ser: 2.83 mg/dL — ABNORMAL HIGH (ref 0.44–1.00)
GFR, Estimated: 17 mL/min — ABNORMAL LOW (ref >60)
Glucose, Bld: 119 mg/dL — ABNORMAL HIGH (ref 70–99)
Phosphorus: 3.3 mg/dL (ref 2.5–4.6)
Potassium: 3.6 mmol/L (ref 3.5–5.1)
Sodium: 127 mmol/L — ABNORMAL LOW (ref 135–145)

## 2020-08-14 LAB — PTH, INTACT AND CALCIUM
Calcium, Total (PTH): 8.2 mg/dL — ABNORMAL LOW (ref 8.7–10.3)
PTH: 47 pg/mL (ref 15–65)

## 2020-08-14 NOTE — Progress Notes (Signed)
Physical Therapy Session Note  Patient Details  Name: Amy [name] MRN: [id number] Date of Birth: 1950/09/04  Today's Date: 08/14/2020 PT Individual Time: 1031-1130 PT Individual Time Calculation (min): 59 min   Short Term Goals: Week 1:  PT Short Term Goal 1 (Week 1): Patient to perform bed mobility with mod A. PT Short Term Goal 1 - Progress (Week 1): Met PT Short Term Goal 2 (Week 1): Patient to perform sit to stand with mod A. PT Short Term Goal 2 - Progress (Week 1): Met PT Short Term Goal 3 (Week 1): Bed<>chair transfers with mod A. PT Short Term Goal 3 - Progress (Week 1): Progressing toward goal PT Short Term Goal 4 (Week 1): Patient to initiate ambulation. PT Short Term Goal 4 - Progress (Week 1): Met  Skilled Therapeutic Interventions/Progress Updates: Pt presents supine in bed and agreeable to therapy.  Pt states HA and will request meds after therapy.  Pt performed LE there ex to increase strength.  Pt performed 2 x 10 HS, abd/add w/ AAROM to RLE, but improved independence.  Pt required Total A to thread pants over feet, but able to pull up to hips.  Pt able to attempt bridging to pull up over hips, but required rolling side to side to complete.  Pt required Total A for donning TED hose and ace wraps to BLEs.  Pt required max A for sup to sit using side rails.  BP monitored during sitting at 113/72 w/ c/o lightheadedness, sit to supine transfer w/ mod A.  BP at 133/77 in supine w/ decreased lightheadedness.  Pt performed sup <> sit transfers x 2 trials w/ supine rest between trials.  Pt required mod A for scooting to [facility] after hooklying position attained.  Pt remianed in bed w/ all needs in reach and bed alarm on.  Notified NT of pt need for pain meds for HA of 7/10.     Therapy Documentation Precautions:  Precautions Precautions: Fall Precaution Comments: seizure precautions Restrictions Weight Bearing Restrictions: No Other Position/Activity Restrictions: head  incision General:   Vital Signs:   Pain: 7/10 headache, nursing notified of need for pain meds. Pain Assessment Pain Scale: 0-10 Pain Score: 6  Pain Type: Chronic pain Pain Location: Head Pain Orientation: Anterior Pain Descriptors / Indicators: Headache Pain Frequency: Intermittent Pain Onset: On-going Pain Intervention(s): Medication (See eMAR) Mobility:   Locomotion :    Trunk/Postural Assessment :    Balance:   Exercises:   Other Treatments:      Therapy/Group: Individual Therapy  Ladoris Gene 08/14/2020, 12:25 PM

## 2020-08-14 NOTE — Progress Notes (Signed)
[place] PHYSICAL MEDICINE & REHABILITATION PROGRESS NOTE  Subjective/Complaints: K+ improved to 3.6 Continues to have headache Na down to 127 Her daughter has brought in nuts for her to eat  ROS: - Nausea , -vomiting.  Denies CP, SOB, diarrhea, +headache  Objective: Vital Signs: Blood pressure 119/64, pulse 82, temperature 98.2 F (36.8 C), resp. rate 16, height 5\' 4"  (1.626 m), weight 47.2 kg, SpO2 99 %. No results found. Recent Labs    08/13/20 0739  WBC 8.2  HGB 10.8*  HCT 33.4*  PLT 348   Recent Labs    08/13/20 0739 08/13/20 1547 08/14/20 0528  NA 129*  --  127*  K 2.6* 3.7 3.6  CL 92*  --  92*  CO2 27  --  28  GLUCOSE 117*  --  119*  BUN 24*  --  23  CREATININE 2.68*  --  2.83*  CALCIUM 8.4*  8.2*  --  8.2*    Intake/Output Summary (Last 24 hours) at 08/14/2020 1308 Last data filed at 08/14/2020 0732 Gross per 24 hour  Intake 118 ml  Output --  Net 118 ml        Physical Exam: BP 119/64 (BP Location: Right Arm)   Pulse 82   Temp 98.2 F (36.8 C)   Resp 16   Ht 5\' 4"  (1.626 m)   Wt 47.2 kg   SpO2 99%   BMI 17.86 kg/m  Gen: no distress, normal appearing HEENT: oral mucosa pink and moist, NCAT Cardio: Reg rate Chest: normal effort, normal rate of breathing Abd: soft, non-distended Ext: no edema Psych: pleasant, normal affect Skin: intact Neuro: Alert Motor: LUE: 4-4+/5 proximal distal, unchanged RUE: 4 --4/5 proximal distal, stable LLE: Hip flexion 2/5, knee extension 2/5, ankle dorsiflexion 3/5, improving RLE: Hip flexion 2/5, knee extension 2/5, ankle dorsiflexion 3/5, improving  Assessment/Plan: 1. Functional deficits which require 3+ hours per day of interdisciplinary therapy in a comprehensive inpatient rehab setting.  Physiatrist is providing close team supervision and 24 hour management of active medical problems listed below.  Physiatrist and rehab team continue to assess barriers to discharge/monitor patient progress toward  functional and medical goals   Care Tool:  Bathing    Body parts bathed by patient: Right arm,Left arm,Chest,Abdomen,Front perineal area,Face,Right upper leg,Left upper leg,Buttocks,Right lower leg,Left lower leg   Body parts bathed by helper: Buttocks     Bathing assist Assist Level: Minimal Assistance - Patient > 75%     Upper Body Dressing/Undressing Upper body dressing   What is the patient wearing?: Pull over shirt    Upper body assist Assist Level: Minimal Assistance - Patient > 75%    Lower Body Dressing/Undressing Lower body dressing      What is the patient wearing?: Pants,Incontinence brief     Lower body assist Assist for lower body dressing: Moderate Assistance - Patient 50 - 74%     Toileting Toileting    Toileting assist Assist for toileting: Total Assistance - Patient < 25%     Transfers Chair/bed transfer  Transfers assist     Chair/bed transfer assist level: Maximal Assistance - Patient 25 - 49% Chair/bed transfer assistive device: Armrests   Locomotion Ambulation   Ambulation assist   Ambulation activity did not occur: Safety/medical concerns  Assist level: Moderate Assistance - Patient 50 - 74% Assistive device: Parallel bars Max distance: 1   Walk 10 feet activity   Assist  Walk 10 feet activity did not occur: Safety/medical concerns  Walk 50 feet activity   Assist Walk 50 feet with 2 turns activity did not occur: Safety/medical concerns         Walk 150 feet activity   Assist Walk 150 feet activity did not occur: Safety/medical concerns         Walk 10 feet on uneven surface  activity   Assist Walk 10 feet on uneven surfaces activity did not occur: Safety/medical concerns         Wheelchair     Assist Will patient use wheelchair at discharge?: Yes Type of Wheelchair: Manual    Wheelchair assist level: Minimal Assistance - Patient > 75% Max wheelchair distance: 66'    Wheelchair 50  feet with 2 turns activity    Assist    Wheelchair 50 feet with 2 turns activity did not occur: Safety/medical concerns       Wheelchair 150 feet activity     Assist  Wheelchair 150 feet activity did not occur: Safety/medical concerns        Medical Problem List and Plan: 1.  Right hemiparesis and aphasia secondary to large left SDH s/Amy craniotomy  Continue CIR PT, OT, SLP 2.  Impaired mobility -DVT/anticoagulation:  Mechanical: Continue Sequential compression devices, below knee Bilateral lower extremities             -antiplatelet therapy: N/A due to bleed.  3. Chronic migraines/ Pain Management: Continue Fioricet 3-4 times a day on average.   Controlled with meds on 4/14  Monitor with increased exertion 4. Mood: LCSW to follow for evaluation and support.              -antipsychotic agents: N/A 5. Neuropsych: This patient is not fully capable of making decisions on her own behalf. 6. Skin/Wound Care: Monitor wound for healing.             --offer supplements. Family to provide meals from home as able.  7. Fluids/Electrolytes/Nutrition: Continue PD--does not need fluid restriction.              --has had chronic issues with variable intake as well as N/V likely due to gastroparesis. 8. Chronic Hypotension: Continue TEDs when out of bed             Vitals:   08/14/20 0448 08/14/20 0717  BP: 115/66 119/64  Pulse: 75 82  Resp: 16   Temp: 98.2 F (36.8 C) 98.2 F (36.8 C)  SpO2: 99% 99%  Normotensive on 4/14 9.  ESRD on PD: Nephrology to assist/manage PD.  10.  Recurrent Seizures: Has been seizure free on Brivaracetam and Lacosamide bid.              --Lacosamide was decreased on 03/31 to avoid sedative SE.  No breakthrough seizures since admission-4/10 11. CAD/NSTEMI s/Amy PCI: Treated medically.   Off DAPT due to bleed             --Continue Lipitor.   Metoprolol low-dose resumed , HR controlled 12. Acute on chronic anemia:   Hemoglobin 10.0 on 4/8  Continue  to monitor 13. H/o Depression: Continue Celexa 14. Sick Euthyroid: Will need follow up as illness resolves.              --TSH- 5.80/Free T4-1.2 (03/27) 15. Gastroparesis w/chronic N/V/anorexia: Has been ongoing per records review.              --question failure of PD (husband reports no N/V with brief episode of HD in the past)  Continue  Reglan 5 3 times daily, vomiting resolved still has some nausea  Hesitant to continue increase medications due to #18  Will discuss with nephrology 16.  Hyponatremia             --chronic hyponatremia with Na-130-133 range  Sodium 130 on 4/5, 127 on 4/14             --Sodium bicarb to manage metabolic acidosis. 17.  Severe hypoalbuminemia  Supplement d/ced given nausea and vomiting- recommended nuts as source of protein. Her daughter has brought in.  18.  Prolonged QTC  ECG reviewed, improved since last visit  Compazine and dose/frequency decreased    LOS: 10 days A FACE TO FACE EVALUATION WAS PERFORMED  Amy [name] Amy [name] 08/14/2020, 1:08 PM

## 2020-08-14 NOTE — Progress Notes (Signed)
Occupational Therapy Session Note  Patient Details  Name: Amy [name] MRN: [id number] Date of Birth: [birth year]  Today's Date: 08/14/2020 OT Individual Time: 1400-1425 OT Individual Time Calculation (min): 25 min    Short Term Goals: Week 2:  OT Short Term Goal 1 (Week 2): patient will complete bathing/dressing tasks edge of bed with min A/CS OT Short Term Goal 2 (Week 2): patient will complete functional transfers mod A OT Short Term Goal 3 (Week 2): patient will tolerate sitting OOB for 1 hour daily  Skilled Therapeutic Interventions/Progress Updates:    Pt resting in bed upon arrival. Pt required min verbal cues to use calendar to recall date. Pt unable to recall events of day and required min verbal cues to locate memory book. Pt also required min verbal cues to physically find memory book on bedside table. Pt unable to recall tasks during earlier therapy sessions (PT and ST). Pt commented that she wasn't sure why she was getting speech therapy because she thought her speech was pretty good. After explanation of goals of St, pt commented that she didn't think it was working. Pt started new page in memory book but unable to complete entries. Pt easily distracted and had difficulty recalling events of day to enter into memory book. Pt remained in bed with all needs within reach. Bed alarm activated.  Therapy Documentation Precautions:  Precautions Precautions: Fall Precaution Comments: seizure precautions Restrictions Weight Bearing Restrictions: No Other Position/Activity Restrictions: head incision Pain: Pain Assessment Pain Scale: 0-10 Pain Score: 6  Pain Type: Chronic pain Pain Location: Head Pain Orientation: Anterior Pain Descriptors / Indicators: Headache Pain Frequency: Intermittent Pain Onset: On-going Pain Intervention(s):premedicated   Therapy/Group: Individual Therapy  Leroy Libman 08/14/2020, 2:39 PM

## 2020-08-14 NOTE — Progress Notes (Signed)
Nutrition Follow-up  DOCUMENTATION CODES:   Severe malnutrition in context of chronic illness  INTERVENTION:   - Continue to recommend [place] discussion regarding nutrition support; if within patient's [place], recommend Cortrak NG tube placement and initiation of enteral nutrition as pt with severe malnutrition and ongoing poor PO intake.  - ContinueMagic Cup TID with meals, each supplement provides 290 kcal and 9 grams of protein  - Continue chocolate milk TID with meals per pt request  - Continue vanilla ice cream TID with meals (to mix with orange cream Magic Cups)  - Encourage adequate PO intake  NUTRITION DIAGNOSIS:   Severe Malnutrition related to chronic illness (ESRD) as evidenced by severe fat depletion,severe muscle depletion.  Ongoing  GOAL:   Patient will meet greater than or equal to 90% of their needs  Unmet  MONITOR:   PO intake,Supplement acceptance,Labs,Weight trends,I & O's  REASON FOR ASSESSMENT:   Other (history of malnutrition)    ASSESSMENT:   70 year old female with PMH of ESRD on PD, chronic hyponatremia, C diff colitis, seizures, NSTEMI s/p PCI w/ DAPT, chronic issues with N/V resulting in debility and CIR stay 3/04-3/24/22. Pt was discharged to home and experienced a mild fall that afternoon followed by obtundation that evening. Pt was evaluated at [facility] 07/24/20 and found to have acute on chronic left SDH. Pt was transferred to [facility] and underwent left partial craniotomy for evacuation of SDH with placement of subdural drain which was removed on 3/28. Cortrak placed at [facility] for nutrition support. Admitted to CIR on 4/04.  Spoke with pt at bedside. Pt watching TV at time of visit. Pt reports that she ate some of her Magic Cup this morning for breakfast. She does not remember whether she has consumed anything else yet today.  Pt reports that she did not have nausea with breakfast but that she is feeling nauseous at the time of RD visit. Emesis bag  within reach. Pt requesting some ice water. Discussed with RN and provided pt with 50 ml of ice water.  Per discussion with pt and care team, pt does not tolerate any oral nutrition supplements other than Magic Cup. Per team, pt has nausea/vomiting with other supplements.  CIR admission weight: 55.5 kg Current weight: 47.8 kg  Meal Completion: 10-75% x last 8 documented meals (averaging 44%)  Medications reviewed and include: aranesp weekly, colace, melatonin, reglan 5 mg TID before meals, rena-vit, protonix, sodium bicarb  Labs reviewed: sodium 127, albumin 1.1  Diet Order:   Diet Order            Diet regular Room service appropriate? Yes; Fluid consistency: Thin; Fluid restriction: 1200 mL Fluid  Diet effective now                 EDUCATION NEEDS:   Education needs have been addressed  Skin:  Skin Assessment: Skin Integrity Issues: Incisions: head  Last BM:  08/13/20 small type 5  Height:   Ht Readings from Last 1 Encounters:  08/04/20 5\' 4"  (1.626 m)    Weight:   Wt Readings from Last 1 Encounters:  08/14/20 47.8 kg    BMI:  Body mass index is 18.09 kg/m.  Estimated Nutritional Needs:   Kcal:  1700-1900  Protein:  85-100 grams  Fluid:  1000 ml + UOP    Gustavus Bryant, MS, RD, LDN Inpatient Clinical Dietitian Please see AMiON for contact information.

## 2020-08-14 NOTE — Progress Notes (Signed)
[facility] Progress Note   Subjective:   Patient seen and examined at bedside.  No acute events.Was hypokalemic yesterday, given 25meq of kcl with improvement. K improved therefore d/c;ed her PM dose. K stable this AM. Did well with PD overnight. She still makes urine. Net uf 1571cc  Objective Vitals:   08/13/20 1949 08/14/20 0448 08/14/20 0712 08/14/20 0717  BP: 116/73 115/66  119/64  Pulse: 85 75  82  Resp: 17 16    Temp: 98.4 F (36.9 C) 98.2 F (36.8 C)  98.2 F (36.8 C)  TempSrc:   Oral   SpO2: 98% 99%  99%  Weight:    47.8 kg  Height:       Physical Exam General:chroncially ill appearing female in NAD, laying flat in bed Heart:RRR Lungs: CTA bl, speaking in full sentences, unlabored, bl ches texpansion Abdomen:soft, NTND, rt quadrant pd cath c/d/i/nontender Extremities: no edema Neuro: awake, alert, speech clear and coherent Dialysis Access: PD cath c/d/i  [facility] Weights   08/10/20 0500 08/11/20 0707 08/14/20 0717  Weight: 52.9 kg 49.4 kg 47.8 kg    Intake/Output Summary (Last 24 hours) at 08/14/2020 0940 Last data filed at 08/14/2020 0732 Gross per 24 hour  Intake 318 ml  Output --  Net 318 ml    Additional Objective Labs: Basic Metabolic Panel: Recent Labs  Lab 08/13/20 0739 08/13/20 1547 08/14/20 0528  NA 129*  --  127*  K 2.6* 3.7 3.6  CL 92*  --  92*  CO2 27  --  28  GLUCOSE 117*  --  119*  BUN 24*  --  23  CREATININE 2.68*  --  2.83*  CALCIUM 8.4*  8.2*  --  8.2*  PHOS 3.6  --  3.3   Liver Function Tests: Recent Labs  Lab 08/13/20 0739 08/14/20 0528  ALBUMIN 1.2* 1.1*   CBC: Recent Labs  Lab 08/08/20 0512 08/11/20 1138 08/13/20 0739  WBC 10.0 10.4 8.2  NEUTROABS 7.7 7.9*  --   HGB 10.0* 10.7* 10.8*  HCT 30.4* 33.2* 33.4*  MCV 97.4 98.2 98.8  PLT 336 345 348   Blood Culture    Component Value Date/Time   SDES PERITONEAL FLUID 08/13/2020 0500   SPECREQUEST NONE 08/13/2020 0500   CULT  08/13/2020 0500    NO  GROWTH 1 DAY Performed at [facility], [address] [place], [place] [address]    REPTSTATUS PENDING 08/13/2020 0500    Medications: . dialysis solution 1.5% low-MG/low-CA    . dialysis solution 2.5% low-MG/low-CA     . aspirin EC  81 mg Oral Daily  . atorvastatin  40 mg Oral Daily  . brivaracetam  75 mg Oral BID  . butalbital-acetaminophen-caffeine  1 tablet Oral TID with meals  . citalopram  10 mg Oral Daily  . darbepoetin (ARANESP) injection - NON-DIALYSIS  60 mcg Subcutaneous Q Tue-1800  . docusate sodium  100 mg Oral BID  . gentamicin cream  1 application Topical Daily  . heparin  5,000 Units Subcutaneous Q12H  . kidney failure book   Does not apply Once  . lacosamide  75 mg Oral BID  . melatonin  5 mg Oral QHS  . metoCLOPramide  5 mg Oral TID AC  . metoprolol tartrate  6.25 mg Oral BID  . multivitamin  1 tablet Oral QHS  . pantoprazole  40 mg Oral Daily  . sodium bicarbonate  650 mg Oral BID  . triamcinolone cream  Topical TID    Dialysis Orders: [facility] followed by Dr. Candiss Norse ([place]). 4 exchanges overnight, 2000 cc and 1.5 hrs each.   Assessment/Plan: 1. Debility: 2/2 Multiple med issues, most recently SDH recovery and nstemi w/ CAD and low EF. Readmitted to CIR on 08/04/20. 2. Fall w/ subdural hematoma - sp SDH evacuation [facility] on3/24/22 3. Nausea/ vomiting - etiology unclear, uremia unlikely. PD cell count in the past and culture unrevealing, repeate 4/13 4. Malnutrition/ debility - low albumin, very weak/tired. It has been d/w husband on several occassions in the past about a possible trial of HD due to low albumin in a PD patient. He had stated in the past they are not interested and feels she would not tolerate HD.  5. Vol overload-resolved - sig LE dependent edema and UE edema on admission. Using 1.5% and 2.5% fluids mix. more on the euvolemic side now 6. ESRD -[facility] [place], [place], f/b Dr Candiss Norse of [place].Good solute control  per labs. Cont PD nightly 4x 2000 cc.  PD gram stain 4/13 looks good cultures pending. 3/23 cell count and culture unrevealing 7. Multivessel CAD-  with reduced EF 25-30% by recent cath in Feb 2022. Per TCTSwasnotCABG candidate. UnderwentPCI to LAD. At [facility] repeat echo per family showed improved EF 55%. 8. Chronichyponatremia - worsening, I am concerned for her low solute intake however N/V and recent SDH are very a contributing factor. Will be hard to interpret her labs given that she is on renal replacement therapy but nonetheless will check serum osm, serum osm, urine lytes. Continue with fluid restriction, encourage solute intake 9. Metabolic acidosis - will stop this today, bicarb trending up she's only on 650mg  bid, monitor for now and restart as needed, rfp tomorrow 10. HTN -BP well controlled on lopressor 6.25mg  BID 9. Anemiaof ESRD-was gettingAranesp 100 MCG q Satwhile herelast admit.  Aranesp r/s'ed, hgb stable 4/13 10. Nutrition:Alb very low, 1.1. Renal diet w/fluid restrictions.Protein supplements.  11. Seizures -per primary team 12. H/oC diff -rx'd in Feb 2022 13. FTT - overall poor prognosis   Gean Quint, MD [place] 08/14/2020,9:40 AM  LOS: 10 days

## 2020-08-14 NOTE — Progress Notes (Signed)
Occupational Therapy TBI Note  Patient Details  Name: Amy [name] MRN: [id number] Date of Birth: 02-02-1951  Today's Date: 08/14/2020 OT Individual Time: 1500-1555 OT Individual Time Calculation (min): 55 min    Short Term Goals: Week 1:  OT Short Term Goal 1 (Week 1): patient will complete LB adl bed level with mod A OT Short Term Goal 1 - Progress (Week 1): Met OT Short Term Goal 2 (Week 1): Patient will complete UB adl w/c level with min A OT Short Term Goal 2 - Progress (Week 1): Met OT Short Term Goal 3 (Week 1): Patient will complete bed mobility with mod A OT Short Term Goal 3 - Progress (Week 1): Progressing toward goal OT Short Term Goal 4 (Week 1): patient will complete functional sit or stand pivot transfer with mod A OT Short Term Goal 4 - Progress (Week 1): Progressing toward goal  Skilled Therapeutic Interventions/Progress Updates:    1:1. Pt received in bed agreeable to OT. Pt unable to recall session with previous OT, but does reach for memory notebook to look but unable to read writing. OT reviews OTAs note. Pt reporting need to toilet. Pt completes HOB elevated 23*>sitting with bed rails and MIN A for trunk elevation and increased time to process cues. Pt requries facilitation of LUE to kick RUE off after OT dons teds. Pt sit to stand with MAX A in stedy to transfer to [facility]. Pt with (+) bowel and bladder void in [facility] after increased time. Total A for hygiene. Pt returns to EOB  And supine for rest break with MOD A for LE elevation. When cued to have pt return to EOB to finish session pt states, "not to be disagreeable but I just dont feel like it." continued ed provided on participating in tx to improve function/decrease BOC. Pt verbalized understanding but continues to refuse to get to EOB. OT cues pt on scooting up to [facility] with improved management of LEs to hooklying position this date. Exited session with pt seated in bed, exit alarm on and call light in reach   Therapy  Documentation Precautions:  Precautions Precautions: Fall Precaution Comments: seizure precautions Restrictions Weight Bearing Restrictions: No Other Position/Activity Restrictions: head incision General:   Vital Signs: Therapy Vitals Temp: 98.2 F (36.8 C) Pulse Rate: 75 Resp: 16 BP: 115/66 Patient Position (if appropriate): Lying Oxygen Therapy SpO2: 99 % O2 Device: Room Air Pain: Pain Assessment Pain Score: Asleep Agitated Behavior Scale: TBI      ADL: ADL Eating: Supervision/safety,Set up Where Assessed-Eating: Bed level Grooming: Minimal assistance Where Assessed-Grooming: Bed level Upper Body Bathing: Moderate assistance Where Assessed-Upper Body Bathing: Bed level Lower Body Bathing: Dependent Where Assessed-Lower Body Bathing: Bed level Upper Body Dressing: Maximal assistance Where Assessed-Upper Body Dressing: Bed level Lower Body Dressing: Dependent Where Assessed-Lower Body Dressing: Bed level Toileting: Dependent Where Assessed-Toileting: Bed level Vision   Perception    Praxis   Exercises:   Other Treatments:     Therapy/Group: Individual Therapy  Tonny Branch 08/14/2020, 6:53 AM

## 2020-08-14 NOTE — Progress Notes (Signed)
Speech Language Pathology Daily Session Note  Patient Details  Name: VERNITA TAGUE MRN: [id number] Date of Birth: [birth year]  Today's Date: 08/14/2020 SLP Individual Time: 1300-1345 SLP Individual Time Calculation (min): 45 min  Short Term Goals: Week 2: SLP Short Term Goal 1 (Week 2): Patient will utilize external memory aids to recall new, daily information with Min A verbal cues. SLP Short Term Goal 2 (Week 2): Pt will demonstrate sustained attention in functional tasks in 30 minue intervals with min A verbal cues for redirection. SLP Short Term Goal 3 (Week 2): Pt will demonstrate mildly complex problem solving skills in functional tasks with min A verbal cues. SLP Short Term Goal 4 (Week 2): Pt will demonstrate self-monitor and self-correction in functional errors with min A verbal cues.  Skilled Therapeutic Interventions:   Patient seen for skilled ST session to address cognitive goals. She did recognize SLP from yesterday's session but not his name. She was oriented to day of week and date without cues. She was able to recall "I think I had one" (when asked if she had any other therapies) but she was not able to recall anything about session even with context cues. She was able to identify 3 differences between two photos with overally minA. She recalled missing object picture in field of 4 with 75% accuracy and demonstrated benefit from cues for association and discussion of picture categories. Patient continues to benefit from skilled SLP intervention to maximize cognitive function prior to discharge.  Pain Pain Assessment Pain Scale: 0-10 Pain Score: 0-No pain  Therapy/Group: Individual Therapy  Sonia Baller, MA, CCC-SLP Speech Therapy

## 2020-08-15 DIAGNOSIS — S065X9A Traumatic subdural hemorrhage with loss of consciousness of unspecified duration, initial encounter: Secondary | ICD-10-CM | POA: Diagnosis not present

## 2020-08-15 LAB — OSMOLALITY, URINE: Osmolality, Ur: 306 mOsm/kg (ref 300–900)

## 2020-08-15 LAB — RENAL FUNCTION PANEL
Albumin: 1 g/dL — ABNORMAL LOW (ref 3.5–5.0)
Anion gap: 9 (ref 5–15)
BUN: 24 mg/dL — ABNORMAL HIGH (ref 8–23)
CO2: 26 mmol/L (ref 22–32)
Calcium: 8.1 mg/dL — ABNORMAL LOW (ref 8.9–10.3)
Chloride: 92 mmol/L — ABNORMAL LOW (ref 98–111)
Creatinine, Ser: 2.8 mg/dL — ABNORMAL HIGH (ref 0.44–1.00)
GFR, Estimated: 18 mL/min — ABNORMAL LOW (ref >60)
Glucose, Bld: 91 mg/dL (ref 70–99)
Phosphorus: 3.6 mg/dL (ref 2.5–4.6)
Potassium: 3 mmol/L — ABNORMAL LOW (ref 3.5–5.1)
Sodium: 127 mmol/L — ABNORMAL LOW (ref 135–145)

## 2020-08-15 LAB — OSMOLALITY: Osmolality: 271 mOsm/kg — ABNORMAL LOW (ref 275–295)

## 2020-08-15 LAB — URINALYSIS, ROUTINE W REFLEX MICROSCOPIC
Glucose, UA: NEGATIVE mg/dL
Hgb urine dipstick: NEGATIVE
Ketones, ur: NEGATIVE mg/dL
Nitrite: NEGATIVE
Protein, ur: NEGATIVE mg/dL
Specific Gravity, Urine: 1.019 (ref 1.005–1.030)
pH: 5 (ref 5.0–8.0)

## 2020-08-15 LAB — NA AND K (SODIUM & POTASSIUM), RAND UR
Potassium Urine: 25 mmol/L
Sodium, Ur: 53 mmol/L

## 2020-08-15 NOTE — Progress Notes (Signed)
[facility] PHYSICAL MEDICINE & REHABILITATION PROGRESS NOTE  Subjective/Complaints: Amy [name] has no complaints this morning.  She tolerated SLP well without pain Has been eating nuts her daughter brought, would like pistachios On bedpan  ROS: - Nausea , -vomiting.  Denies CP, SOB, diarrhea, headache currently well controlled  Objective: Vital Signs: Blood pressure 98/62, pulse 76, temperature 97.9 F (36.6 C), resp. rate 16, height 5\' 4"  (1.626 m), weight 46.8 kg, SpO2 97 %. No results found. Recent Labs    08/13/20 0739  WBC 8.2  HGB 10.8*  HCT 33.4*  PLT 348   Recent Labs    08/14/20 0528 08/15/20 0504  NA 127* 127*  K 3.6 3.0*  CL 92* 92*  CO2 28 26  GLUCOSE 119* 91  BUN 23 24*  CREATININE 2.83* 2.80*  CALCIUM 8.2* 8.1*    Intake/Output Summary (Last 24 hours) at 08/15/2020 1018 Last data filed at 08/15/2020 0745 Gross per 24 hour  Intake 356 ml  Output --  Net 356 ml        Physical Exam: BP 98/62 (BP Location: Right Arm)   Pulse 76   Temp 97.9 F (36.6 C)   Resp 16   Ht 5\' 4"  (1.626 m)   Wt 46.8 kg   SpO2 97%   BMI 17.71 kg/m  Gen: no distress, normal appearing HEENT: oral mucosa pink and moist, NCAT Cardio: Reg rate Chest: normal effort, normal rate of breathing Abd: soft, non-distended Ext: no edema Psych: pleasant, normal affect Skin: intact Neuro: Alert Motor: LUE: 4-4+/5 proximal distal, unchanged RUE: 4 --4/5 proximal distal, stable LLE: Hip flexion 2/5, knee extension 2/5, ankle dorsiflexion 3/5, improving RLE: Hip flexion 2/5, knee extension 2/5, ankle dorsiflexion 3/5, improving  Assessment/Plan: 1. Functional deficits which require 3+ hours per day of interdisciplinary therapy in a comprehensive inpatient rehab setting.  Physiatrist is providing close team supervision and 24 hour management of active medical problems listed below.  Physiatrist and rehab team continue to assess barriers to discharge/monitor patient progress  toward functional and medical goals   Care Tool:  Bathing    Body parts bathed by patient: Right arm,Left arm,Chest,Abdomen,Front perineal area,Face,Right upper leg,Left upper leg,Buttocks,Right lower leg,Left lower leg   Body parts bathed by helper: Buttocks     Bathing assist Assist Level: Minimal Assistance - Patient > 75%     Upper Body Dressing/Undressing Upper body dressing   What is the patient wearing?: Pull over shirt    Upper body assist Assist Level: Minimal Assistance - Patient > 75%    Lower Body Dressing/Undressing Lower body dressing      What is the patient wearing?: Pants,Incontinence brief     Lower body assist Assist for lower body dressing: Moderate Assistance - Patient 50 - 74%     Toileting Toileting    Toileting assist Assist for toileting: Total Assistance - Patient < 25%     Transfers Chair/bed transfer  Transfers assist     Chair/bed transfer assist level: Maximal Assistance - Patient 25 - 49% Chair/bed transfer assistive device: Armrests   Locomotion Ambulation   Ambulation assist   Ambulation activity did not occur: Safety/medical concerns  Assist level: Moderate Assistance - Patient 50 - 74% Assistive device: Parallel bars Max distance: 1   Walk 10 feet activity   Assist  Walk 10 feet activity did not occur: Safety/medical concerns        Walk 50 feet activity   Assist Walk 50 feet with 2 turns  activity did not occur: Safety/medical concerns         Walk 150 feet activity   Assist Walk 150 feet activity did not occur: Safety/medical concerns         Walk 10 feet on uneven surface  activity   Assist Walk 10 feet on uneven surfaces activity did not occur: Safety/medical concerns         Wheelchair     Assist Will patient use wheelchair at discharge?: Yes Type of Wheelchair: Manual    Wheelchair assist level: Minimal Assistance - Patient > 75% Max wheelchair distance: 27'     Wheelchair 50 feet with 2 turns activity    Assist    Wheelchair 50 feet with 2 turns activity did not occur: Safety/medical concerns       Wheelchair 150 feet activity     Assist  Wheelchair 150 feet activity did not occur: Safety/medical concerns        Medical Problem List and Plan: 1.  Right hemiparesis and aphasia secondary to large left SDH s/p craniotomy  Continue CIR PT, OT, SLP 2.  Impaired mobility -DVT/anticoagulation:  Mechanical: Continue Sequential compression devices, below knee Bilateral lower extremities             -antiplatelet therapy: N/A due to bleed.  3. Chronic migraines/ Pain Management: Continue Fioricet 3-4 times a day on average.   Controlled with meds on 4/15  Monitor with increased exertion 4. Mood: LCSW to follow for evaluation and support.              -antipsychotic agents: N/A 5. Neuropsych: This patient is not fully capable of making decisions on her own behalf. 6. Skin/Wound Care: Monitor wound for healing.             -Family to provide meals from home as able.  -bilateral PRAFOs ordered  7. Fluids/Electrolytes/Nutrition: Continue PD--does not need fluid restriction.              --has had chronic issues with variable intake as well as N/V likely due to gastroparesis. 8. Chronic Hypotension: Continue TEDs when out of bed             Vitals:   08/14/20 2036 08/15/20 0415  BP: 109/71 98/62  Pulse: 75 76  Resp: 16 16  Temp: 97.9 F (36.6 C)   SpO2: 99% 97%  Normotensive on 4/15 9.  ESRD on PD: Nephrology to assist/manage PD.  10.  Recurrent Seizures: Has been seizure free on Brivaracetam and Lacosamide bid.              --Lacosamide was decreased on 03/31 to avoid sedative SE.  No breakthrough seizures since admission-4/10 11. CAD/NSTEMI s/p PCI: Treated medically.   Off DAPT due to bleed             --Continue Lipitor.   Metoprolol low-dose resumed , HR controlled 12. Acute on chronic anemia:   Hemoglobin 10.0 on  4/8  Continue to monitor 13. H/o Depression: Continue Celexa 14. Sick Euthyroid: Will need follow up as illness resolves.              --TSH- 5.80/Free T4-1.2 (03/27) 15. Gastroparesis w/chronic N/V/anorexia: Has been ongoing per records review.              --question failure of PD (husband reports no N/V with brief episode of HD in the past)  Continue Reglan 5 3 times daily, vomiting resolved still has some nausea  Hesitant to  continue increase medications due to #18  Will discuss with nephrology 16.  Hyponatremia             --chronic hyponatremia with Na-130-133 range  Sodium 130 on 4/5, 127 on 4/14             --Sodium bicarb to manage metabolic acidosis. 17.  Severe hypoalbuminemia  Supplement d/ced given nausea and vomiting- recommended nuts as source of protein. Her daughter has brought in.  18.  Prolonged QTC  ECG reviewed, improved since last visit  Compazine and dose/frequency decreased    LOS: 11 days A FACE TO FACE EVALUATION WAS PERFORMED  Xaivier Malay P Chrisean Kloth 08/15/2020, 10:18 AM

## 2020-08-15 NOTE — Progress Notes (Signed)
Physical Therapy Session Note  Patient Details  Name: Amy [name] MRN: [id number] Date of Birth: Dec 16, 1950  Today's Date: 08/15/2020 PT Individual Time: 1500-1600 PT Individual Time Calculation (min): 60 min   Short Term Goals: Week 2:  PT Short Term Goal 1 (Week 2): Patient will perform bed<>chair transfers with mod A. PT Short Term Goal 2 (Week 2): Patient will ambulate 7' in parallel bars with mod A PT Short Term Goal 3 (Week 2): Patient will propel w/c 63' with S. PT Short Term Goal 4 (Week 2): Patient will tolerate standing 1 minute for functional task.  Skilled Therapeutic Interventions/Progress Updates:    Patient in supine with daughter in the room.  Patient supine to sit with mod A and increased time.  BP measured supine 109/74, sitting 94/68 pt wearing knee hi TEDS.  Performed sitting LAQ and ankle pumps x 5 each.  Sitting balance with S and pt transferred to w/c via squat pivot mod to max A.  Pushed in w/c to therapy gym.  Sit to stand x 2 in parallel bars with max A blocking L knee and pt needing assist to lean forward and lift.  Once up in standing able to balance with UE support and min to mod A taking 1 step forward and one step back with mod A and taking care to block R knee.  Patient on second stand performed lateral weight shifts with min A.  Up tolerated standing about 30 seconds max.  Patient propelled w/c x 51' with close S and cues.  Patient assisted to dayroom in w/c and performed 2 x 20 reps on Kinetron at 50 cm/sec, then hip adductor squeezes with ball between knees x 10 reps w/ 3 sec hold.  Patient assisted to room in w/c and transferred to bed via squat pivot using bed rail with mod A.  Sit to supine mod A and pt requesting to doff pants so max A to doff pants and remove TEDs and replace slipper socks.  Placed up in chair position in bed and left with bed alarm on and needs in reach.   Therapy Documentation Precautions:  Precautions Precautions: Fall Precaution  Comments: seizure precautions Restrictions Weight Bearing Restrictions: No Other Position/Activity Restrictions: head incision Pain: Pain Assessment Pain Scale: Faces Pain Score: 0-No pain Faces Pain Scale: No hurt    Therapy/Group: Individual Therapy  Reginia Naas  Magda Kiel, PT 08/15/2020, 4:57 PM

## 2020-08-15 NOTE — Progress Notes (Signed)
Occupational Therapy Session Note  Patient Details  Name: AMERAH PULEO MRN: [id number] Date of Birth: 1951-01-25  Today's Date: 08/15/2020 OT Individual Time: 0930-1015 OT Individual Time Calculation (min): 45 min    Short Term Goals: Week 2:  OT Short Term Goal 1 (Week 2): patient will complete bathing/dressing tasks edge of bed with min A/CS OT Short Term Goal 2 (Week 2): patient will complete functional transfers mod A OT Short Term Goal 3 (Week 2): patient will tolerate sitting OOB for 1 hour daily  Skilled Therapeutic Interventions/Progress Updates:    Pt seen this session to focus on bed mobility of rolling and bridging for LE strength to engage in use of bed pan and LB bathing and dressing.  She needs max A and max cues but otherwise participated well and engaged in therapy.  Resting in bed with all needs met and alarm set.   Therapy Documentation Precautions:  Precautions Precautions: Fall Precaution Comments: seizure precautions Restrictions Weight Bearing Restrictions: No Other Position/Activity Restrictions: head incision   Pain: Pain Assessment Pain Scale: 0-10 Pain Score: 0-No pain Faces Pain Scale: No hurt ADL: ADL Eating: Supervision/safety,Set up Where Assessed-Eating: Bed level Grooming: Minimal assistance Where Assessed-Grooming: Bed level Upper Body Bathing: Moderate assistance Where Assessed-Upper Body Bathing: Bed level Lower Body Bathing: Dependent Where Assessed-Lower Body Bathing: Bed level Upper Body Dressing: Maximal assistance Where Assessed-Upper Body Dressing: Bed level Lower Body Dressing: Dependent Where Assessed-Lower Body Dressing: Bed level Toileting: Dependent Where Assessed-Toileting: Bed level   Therapy/Group: Individual Therapy  [place] 08/15/2020, 9:33 AM

## 2020-08-15 NOTE — Progress Notes (Signed)
Occupational Therapy Session Note  Patient Details  Name: Amy [name] MRN: [id number] Date of Birth: 08/21/1950  Today's Date: 08/15/2020 OT Individual Time: 6659-9357 OT Individual Time Calculation (min): 28 min    Short Term Goals: Week 2:  OT Short Term Goal 1 (Week 2): patient will complete bathing/dressing tasks edge of bed with min A/CS OT Short Term Goal 2 (Week 2): patient will complete functional transfers mod A OT Short Term Goal 3 (Week 2): patient will tolerate sitting OOB for 1 hour daily  Skilled Therapeutic Interventions/Progress Updates:    Pt in bed with her daughter present.  She was agreeable to participate in therapy and transfer to the side of the bed.  BP was taken in supine with HOB elevated at approximately 30 degrees at 113/64.  She needed mod assist for transition to the EOB.  Static sitting balance at supervision level with pt demonstrating difficulty maintaining an anterior pelvic tilt.  She was able to sit EOB for 2 more BPs at 99/57 and 100/62.  She sat for 10 mins with increased fatigue but no LOB while engaged in conversation about her daughter to help distract from having her lay down.  Finished session with sit to stand from the EOB at mod assist and pt taking 2 small steps up toward the top of the bed before sitting back down.  She transitioned back to supine to rest with mod assist for bringing LEs into the bed.  Call button and phone in reach with daughter present.    Therapy Documentation Precautions:  Precautions Precautions: Fall Precaution Comments: seizure precautions Restrictions Weight Bearing Restrictions: No Other Position/Activity Restrictions: head incision   Pain: Pain Assessment Pain Scale: Faces Pain Score: 0-No pain Faces Pain Scale: No hurt ADL: See Care Tool Section for some details of mobility and selfcare  Therapy/Group: Individual Therapy  Joshlyn Beadle OTR/L 08/15/2020, 3:48 PM

## 2020-08-15 NOTE — Progress Notes (Signed)
Orthopedic Tech Progress Note Patient Details:  Amy [name] 1950-06-19 [id number]  Ortho Devices Type of Ortho Device: Prafo boot/shoe Ortho Device/Splint Location: Bilateral Ortho Device/Splint Interventions: Ordered       Danton Sewer A Paulino Cork 08/15/2020, 7:24 PM

## 2020-08-15 NOTE — Progress Notes (Signed)
Occupational Therapy Session Note  Patient Details  Name: TOPEKA GIAMMONA MRN: [id number] Date of Birth: 11-Jan-1951  Today's Date: 08/15/2020 OT Individual Time: 1100-1155 OT Individual Time Calculation (min): 55 min    Short Term Goals: Week 2:  OT Short Term Goal 1 (Week 2): patient will complete bathing/dressing tasks edge of bed with min A/CS OT Short Term Goal 2 (Week 2): patient will complete functional transfers mod A OT Short Term Goal 3 (Week 2): patient will tolerate sitting OOB for 1 hour daily  Skilled Therapeutic Interventions/Progress Updates:    Pt resting in bed upon arrival. Initial focus on recalling earlier therapy. Pt initially stated she had no therapy earlier. Verbal cues to locate memory book. Pt admitted that she did recall putting on new pants and some exercises in bed, although she could not recall specific exercises. Pt recalled date when asked. Pt changed shirt and hat at bed level. Pt presented with orange theraband that was on bed rail. Pt knew it was used for exercises but could not recall specific exercises. Pt performed 2x8 of diagonals and straight arm pulls. Pt remained in bed with bed alarm activated and all needs within reach.   Therapy Documentation Precautions:  Precautions Precautions: Fall Precaution Comments: seizure precautions Restrictions Weight Bearing Restrictions: No Other Position/Activity Restrictions: head incision   Pain:   Pt c/o lower abdominal pain when NP present    Therapy/Group: Individual Therapy  Leroy Libman 08/15/2020, 12:02 PM

## 2020-08-15 NOTE — Progress Notes (Signed)
[facility] Progress Note   Subjective:    Amy [name] was seen and examined today at bedside. Spoke with HD RN; tolerated PD overnight and pulled off 1.3L. Informed from HD RN of blood in PD fluid this morning. Patient with history of recurrent UTIs. Patient reports mild lower abdominal tenderness. Still making urine and denies urinary pain/burning. Spoke with PA and will order U/A with urine culture. Otherwise, patient is doing fine. Denies SOB, CP, ABD pain, and N/V. Currently sitting up in bed and scheduled to work with OT.  Objective Vitals:   08/14/20 1700 08/14/20 2036 08/15/20 0415 08/15/20 0500  BP: 119/69 109/71 98/62   Pulse: 72 75 76   Resp: 16 16 16    Temp: 98.2 F (36.8 C) 97.9 F (36.6 C)    TempSrc: Oral     SpO2: 100% 99% 97%   Weight: 49.3 kg   46.8 kg  Height:       Physical Exam General: Appears comfortable; No acute respiratory distress Heart: Normal S1 and S2; No murmurs, gallops, or friction rub Lungs: Clear throughout w/o wheezing, rales, or rhonchi Abdomen: Soft, non-tender to palpation, active bowel sounds Extremities: No edema bilateral lower extremities Dialysis Access: PD cath; Dressing CDI  Filed Weights   08/14/20 1206 08/14/20 1700 08/15/20 0500  Weight: 47.2 kg 49.3 kg 46.8 kg    Intake/Output Summary (Last 24 hours) at 08/15/2020 1213 Last data filed at 08/15/2020 0745 Gross per 24 hour  Intake 356 ml  Output --  Net 356 ml    Additional Objective Labs: Basic Metabolic Panel: Recent Labs  Lab 08/13/20 0739 08/13/20 1547 08/14/20 0528 08/15/20 0504  NA 129*  --  127* 127*  K 2.6* 3.7 3.6 3.0*  CL 92*  --  92* 92*  CO2 27  --  28 26  GLUCOSE 117*  --  119* 91  BUN 24*  --  23 24*  CREATININE 2.68*  --  2.83* 2.80*  CALCIUM 8.4*  8.2*  --  8.2* 8.1*  PHOS 3.6  --  3.3 3.6   Liver Function Tests: Recent Labs  Lab 08/13/20 0739 08/14/20 0528 08/15/20 0504  ALBUMIN 1.2* 1.1* 1.0*   CBC: Recent Labs  Lab  08/11/20 1138 08/13/20 0739  WBC 10.4 8.2  NEUTROABS 7.9*  --   HGB 10.7* 10.8*  HCT 33.2* 33.4*  MCV 98.2 98.8  PLT 345 348   Blood Culture    Component Value Date/Time   SDES PERITONEAL FLUID 08/13/2020 0500   SPECREQUEST NONE 08/13/2020 0500   CULT  08/13/2020 0500    NO GROWTH 2 DAYS Performed at [facility], [place] [address], [place], [place] [address]    REPTSTATUS PENDING 08/13/2020 0500   Lab Results  Component Value Date   INR 1.1 06/19/2020   INR 1.2 04/04/2019   INR 1.0 04/03/2019   Medications: . dialysis solution 1.5% low-MG/low-CA    . dialysis solution 2.5% low-MG/low-CA     . aspirin EC  81 mg Oral Daily  . atorvastatin  40 mg Oral Daily  . brivaracetam  75 mg Oral BID  . butalbital-acetaminophen-caffeine  1 tablet Oral TID with meals  . citalopram  10 mg Oral Daily  . darbepoetin (ARANESP) injection - NON-DIALYSIS  60 mcg Subcutaneous Q Tue-1800  . docusate sodium  100 mg Oral BID  . gentamicin cream  1 application Topical Daily  . heparin  5,000 Units Subcutaneous Q12H  . kidney failure book  Does not apply Once  . lacosamide  75 mg Oral BID  . melatonin  5 mg Oral QHS  . metoCLOPramide  5 mg Oral TID AC  . metoprolol tartrate  6.25 mg Oral BID  . multivitamin  1 tablet Oral QHS  . pantoprazole  40 mg Oral Daily  . sodium bicarbonate  650 mg Oral BID  . triamcinolone cream   Topical TID    Dialysis Orders: [place] followed by Dr. Candiss Norse ([place]). 4 exchanges overnight, 2000 cc and 1.5 hrs each.   Assessment/Plan: 1. Urinary Tract Infection: Informed by HD RN of blood in PD fluid after overnight exchange complete. Spoke with patient. Has history of recurrent UTIs. Still making urine. Denies dysuria, frequency, and urgency at this time. Will order U/A with urine culture today. 2. Debility: 2/2 Multiple med issues, most recently SDH recovery and nstemi w/ CAD and low EF. Readmitted to CIR on 08/04/20. 3. Fall w/  subdural hematoma - sp SDH evacuation at [facility] on3/24/22 4. Nausea/ vomiting - etiology unclear, uremiaunlikely. PD cell count in the past and culture unrevealing, Preliminary report of body fluid culture shows no growth X 2 days; awaiting final report. 5. Malnutrition/ debility - low albumin, very weak/tired. It has been d/w husband on several occassions in the past about a possible trial of HD due to low albumin in a PD patient. He had stated in the past they are not interested and feels she would not tolerate HD. 6. Vol overload-resolved - sig LE dependent edema and UE edemaon admission. Using 1.5% and 2.5% fluidsmix. Patient euvolemic on exam today. Will monitor. 7. ESRD -[facility]PDin [place], [place], f/b Dr Candiss Norse of [place].Cont PD nightly4x 2000 cc.PD gram stain 4/13 looks good cultures pending. 3/23 cell count and culture unrevealing 8. Multivessel CAD- with reduced EF 25-30% by recent cath in Feb 2022. Per TCTSwasnotCABG candidate. UnderwentPCI to LAD. At [facility] repeat echo per family showed improved EF 55%. 9. Chronichyponatremia - worsening, Na at 127. Issues with N/V and recent SDH more likely a contributing factor. Will be hard to interpret her labs given that she is on renal replacement therapy but nonetheless will check serum osm, serum osm, urine lytes. Continue with fluid restriction, encourage solute intake. 10. Metabolic acidosis - Bicarb trending up; now at 26. Monitor for now and restart sodium bicarb as needed.  11. HTN -BP soft today but otherwise stable. Continue to monitor trend. Continue lopressor 6.25mg  BID 12. Anemiaof ESRD-was gettingAranesp 100 MCG q Satwhile herelast admit.  Aranesp r/s'ed, hgb now at 10.8. 13. Nutrition:Alb very low, 1.0. Renal diet w/fluid restrictions.Protein supplements.  14. Seizures -per primary team 15. H/oC diff -rx'd in Feb 2022 16. FTT - overall poor prognosis  Tobie Poet, NP [facility] 08/15/2020,12:13  PM  LOS: 11 days

## 2020-08-15 NOTE — Progress Notes (Signed)
Prafo boots were applied however patient wanted them off for the rest of the night.

## 2020-08-15 NOTE — Progress Notes (Signed)
Tx completed; UF=1309; Blood noted in the pt's drained fluid post PD tx. Dr. Gean Quint and [place], [place] made aware. Pt also state she had some discomfort in the LLQ abdomen.Larene Beach, RN on the floor made aware during report.

## 2020-08-16 DIAGNOSIS — S065X9A Traumatic subdural hemorrhage with loss of consciousness of unspecified duration, initial encounter: Secondary | ICD-10-CM | POA: Diagnosis not present

## 2020-08-16 LAB — BODY FLUID CULTURE W GRAM STAIN: Culture: NO GROWTH

## 2020-08-16 LAB — URINE CULTURE

## 2020-08-16 MED ORDER — BUTALBITAL-APAP-CAFFEINE 50-325-40 MG PO TABS
1.0000 | ORAL_TABLET | Freq: Four times a day (QID) | ORAL | Status: DC | PRN
  Administered 2020-08-16: 1 via ORAL
  Filled 2020-08-16 (×2): qty 1

## 2020-08-16 MED ORDER — POTASSIUM CHLORIDE CRYS ER 20 MEQ PO TBCR
40.0000 meq | EXTENDED_RELEASE_TABLET | Freq: Two times a day (BID) | ORAL | Status: AC
  Administered 2020-08-16 – 2020-08-17 (×2): 40 meq via ORAL
  Filled 2020-08-16 (×2): qty 2

## 2020-08-16 NOTE — Progress Notes (Signed)
[facility] PHYSICAL MEDICINE & REHABILITATION PROGRESS NOTE  Subjective/Complaints: Sleeping Refusing bilateral PRAFOs Patient's chart reviewed- No issues reported overnight Vitals signs stable  ROS: - Nausea , -vomiting.  Denies CP, SOB, diarrhea, headache currently well controlled  Objective: Vital Signs: Blood pressure 120/67, pulse 80, temperature 97.6 F (36.4 C), temperature source Oral, resp. rate 16, height 5\' 4"  (1.626 m), weight 46.3 kg, SpO2 100 %. No results found. No results for input(s): WBC, HGB, HCT, PLT in the last 72 hours. Recent Labs    08/14/20 0528 08/15/20 0504  NA 127* 127*  K 3.6 3.0*  CL 92* 92*  CO2 28 26  GLUCOSE 119* 91  BUN 23 24*  CREATININE 2.83* 2.80*  CALCIUM 8.2* 8.1*    Intake/Output Summary (Last 24 hours) at 08/16/2020 1344 Last data filed at 08/16/2020 0752 Gross per 24 hour  Intake 418 ml  Output 1 ml  Net 417 ml        Physical Exam: BP 120/67 (BP Location: Right Arm)   Pulse 80   Temp 97.6 F (36.4 C) (Oral)   Resp 16   Ht 5\' 4"  (1.626 m)   Wt 46.3 kg   SpO2 100%   BMI 17.52 kg/m  Gen: no distress, normal appearing HEENT: oral mucosa pink and moist, NCAT Cardio: Reg rate Chest: normal effort, normal rate of breathing Abd: soft, non-distended Ext: no edema Psych: pleasant, normal affect Skin: intact Neuro: Alert Motor: LUE: 4-4+/5 proximal distal, unchanged RUE: 4 --4/5 proximal distal, stable LLE: Hip flexion 2/5, knee extension 2/5, ankle dorsiflexion 3/5, improving RLE: Hip flexion 2/5, knee extension 2/5, ankle dorsiflexion 3/5, improving  Assessment/Plan: 1. Functional deficits which require 3+ hours per day of interdisciplinary therapy in a comprehensive inpatient rehab setting.  Physiatrist is providing close team supervision and 24 hour management of active medical problems listed below.  Physiatrist and rehab team continue to assess barriers to discharge/monitor patient progress toward functional  and medical goals   Care Tool:  Bathing    Body parts bathed by patient: Right arm,Left arm,Chest,Abdomen,Front perineal area,Face,Right upper leg,Left upper leg,Buttocks,Right lower leg,Left lower leg   Body parts bathed by helper: Buttocks     Bathing assist Assist Level: Minimal Assistance - Patient > 75%     Upper Body Dressing/Undressing Upper body dressing   What is the patient wearing?: Pull over shirt    Upper body assist Assist Level: Minimal Assistance - Patient > 75%    Lower Body Dressing/Undressing Lower body dressing      What is the patient wearing?: Pants     Lower body assist Assist for lower body dressing: Total Assistance - Patient < 25%     Toileting Toileting    Toileting assist Assist for toileting: Total Assistance - Patient < 25%     Transfers Chair/bed transfer  Transfers assist     Chair/bed transfer assist level: Moderate Assistance - Patient 50 - 74% Chair/bed transfer assistive device: Armrests   Locomotion Ambulation   Ambulation assist   Ambulation activity did not occur: Safety/medical concerns  Assist level: Moderate Assistance - Patient 50 - 74% Assistive device: Parallel bars Max distance: 1   Walk 10 feet activity   Assist  Walk 10 feet activity did not occur: Safety/medical concerns        Walk 50 feet activity   Assist Walk 50 feet with 2 turns activity did not occur: Safety/medical concerns         Walk 150 feet  activity   Assist Walk 150 feet activity did not occur: Safety/medical concerns         Walk 10 feet on uneven surface  activity   Assist Walk 10 feet on uneven surfaces activity did not occur: Safety/medical concerns         Wheelchair     Assist Will patient use wheelchair at discharge?: Yes Type of Wheelchair: Manual    Wheelchair assist level: Supervision/Verbal cueing Max wheelchair distance: 89'    Wheelchair 50 feet with 2 turns activity    Assist     Wheelchair 50 feet with 2 turns activity did not occur: Safety/medical concerns   Assist Level: Supervision/Verbal cueing   Wheelchair 150 feet activity     Assist  Wheelchair 150 feet activity did not occur: Safety/medical concerns        Medical Problem List and Plan: 1.  Right hemiparesis and aphasia secondary to large left SDH s/p craniotomy  Continue CIR PT, OT, SLP 2.  Impaired mobility -DVT/anticoagulation:  Mechanical: Continue Sequential compression devices, below knee Bilateral lower extremities             -antiplatelet therapy: N/A due to bleed.  3. Chronic migraines/ Pain Management: Continue Fioricet 3-4 times a day on average.  Controlled with meds on 4/16  Monitor with increased exertion 4. Mood: LCSW to follow for evaluation and support.              -antipsychotic agents: N/A 5. Neuropsych: This patient is not fully capable of making decisions on her own behalf. 6. Skin/Wound Care: Monitor wound for healing.             -Family to provide meals from home as able.  -bilateral PRAFOs ordered but patient refuses to wear 7. Fluids/Electrolytes/Nutrition: Continue PD--does not need fluid restriction.              --has had chronic issues with variable intake as well as N/V likely due to gastroparesis. D/ced protein supplements and has improved.  8. Chronic Hypotension: Continue TEDs when out of bed             Vitals:   08/16/20 0700 08/16/20 1321  BP: 116/66 120/67  Pulse: 70 80  Resp: 17 16  Temp: (!) 97.5 F (36.4 C) 97.6 F (36.4 C)  SpO2: 100% 100%  Normotensive on 4/16 9.  ESRD on PD: Nephrology to assist/manage PD.  10.  Recurrent Seizures: Has been seizure free, continue Brivaracetam and Lacosamide bid.              --Lacosamide was decreased on 03/31 to avoid sedative SE.  No breakthrough seizures since admission-4/16 11. CAD/NSTEMI s/p PCI: Treated medically.   Off DAPT due to bleed             --Continue Lipitor.   Metoprolol low-dose  resumed , HR controlled 12. Acute on chronic anemia:   Hemoglobin 10.0 on 4/8  Continue to monitor 13. H/o Depression: Continue Celexa 14. Sick Euthyroid: Will need follow up as illness resolves.              --TSH- 5.80/Free T4-1.2 (03/27) 15. Gastroparesis w/chronic N/V/anorexia: Has been ongoing per records review.              --question failure of PD (husband reports no N/V with brief episode of HD in the past)  Continue Reglan 5 3 times daily, vomiting resolved still has some nausea  Hesitant to continue increase medications due to #  18  Will discuss with nephrology 16.  Hyponatremia             --chronic hyponatremia with Na-130-133 range  Sodium 130 on 4/5, 127 on 4/14             --Sodium bicarb to manage metabolic acidosis. 17.  Severe hypoalbuminemia  Supplement d/ced given nausea and vomiting- recommended nuts as source of protein. Her daughter has brought in.  18.  Prolonged QTC  ECG reviewed, improved since last visit  Compazine and dose/frequency decreased    LOS: 12 days A FACE TO FACE EVALUATION WAS PERFORMED  Gatlyn Lipari P Trany Chernick 08/16/2020, 1:44 PM

## 2020-08-16 NOTE — Progress Notes (Signed)
Occupational Therapy Session Note Occupational Therapy TBI Note  Patient Details  Name: Amy [name] MRN: [id number] Date of Birth: 1951/03/29  Today's Date: 08/16/2020 OT Individual Time: 1009-1104 OT Individual Time Calculation (min): 55 min    Short Term Goals: Week 2:  OT Short Term Goal 1 (Week 2): patient will complete bathing/dressing tasks edge of bed with min A/CS OT Short Term Goal 2 (Week 2): patient will complete functional transfers mod A OT Short Term Goal 3 (Week 2): patient will tolerate sitting OOB for 1 hour daily  Skilled Therapeutic Interventions/Progress Updates:    Pt received semi-reclined in bed, agreeable to therapy. BP in supine with HOB at 30 degrees at 111/65 (79), transitioned to sitting EOB with overall max A to progress BLE off bed and to lift trunk. Pt able to assist pushing up when BUE placed on bed rail, req max VCs for initiation and sequencing. BP read at 92/54 (67), pt c/o headache and dizziness, req to lay back down, able to maintain sitting for another minute for BP to read at 98/63 (74). Return to supine with max A, pt able to assist scooting herself up in bed via bridging and pulling up on hand rails. Donned pants total A bed level, pt able to assist with pulling pants over knees and bridging to pull over hips. Reports urgent need to use bed pan. Bed pan placed with mod A to roll pt R/L. Cont void of bowel. Total A for LB clothing management and pericare at bed level, pt able to roll R/L with min A and VCs for initiation to assist. RN notified of pt req pain med for headache. Pt req max VCs to recall session activities to write in memory notebook.    Pt left with HOB at 30 in bed, bed alarm engaged, call bell in reach, and all immediate needs met.    Therapy Documentation Precautions:  Precautions Precautions: Fall Precaution Comments: seizure precautions Restrictions Weight Bearing Restrictions: No Other Position/Activity Restrictions: head  incision Pain: Pain Assessment Pain Scale: 0-10 Pain Score: 0-No pain Pain Type: Chronic pain Pain Location: Head Multiple Pain Sites: No   ADL: See Care Tool for more details.  Agitated Behavior Scale (DO NOT LEAVE BLANKS) Short attention span, easy distractibility, inability to concentrate: Present to a moderate degree Impulsive, impatient, low tolerance for pain or frustration: Absent Uncooperative, resistant to care, demanding: Absent Violent and/or threatening violence toward people or property: Absent Explosive and/or unpredictable anger: Absent Rocking, rubbing, moaning, or other self-stimulating behavior: Absent Pulling at tubes, restraints, etc.: Absent Wandering from treatment areas: Absent Restlessness, pacing, excessive movement: Absent Repetitive behaviors, motor, and/or verbal: Absent Rapid, loud, or excessive talking: Absent Sudden changes of mood: Absent Easily initiated or excessive crying and/or laughter: Absent Self-abusiveness, physical and/or verbal: Absent Agitated behavior scale total score: 16  Therapy/Group: Individual Therapy  Volanda Napoleon MS, OTR/L  08/16/2020, 10:34 AM

## 2020-08-16 NOTE — Progress Notes (Signed)
[facility] Progress Note   Subjective:    Seen and examined bedside. No new issues/complaints as per patient. No bloody effluent today. uf 995cc  Objective Vitals:   08/15/20 1936 08/16/20 0410 08/16/20 0500 08/16/20 0700  BP: 118/70 109/62  116/66  Pulse: 72 67  70  Resp: 17 16  17   Temp: 98.2 F (36.8 C) 99.6 F (37.6 C)  (!) 97.5 F (36.4 C)  TempSrc: Oral Oral  Oral  SpO2: 99% 97%  100%  Weight:   47.7 kg 46.3 kg  Height:       Physical Exam General: Appears comfortable; No acute respiratory distress Heart: Normal S1 and S2; No murmurs, gallops, or friction rub Lungs: Clear throughout w/o wheezing, rales, or rhonchi Abdomen: Soft, non-tender to palpation, active bowel sounds Extremities: No edema bilateral lower extremities Dialysis Access: PD cath; Dressing CDI  Filed Weights   08/15/20 0500 08/16/20 0500 08/16/20 0700  Weight: 46.8 kg 47.7 kg 46.3 kg    Intake/Output Summary (Last 24 hours) at 08/16/2020 0844 Last data filed at 08/16/2020 0752 Gross per 24 hour  Intake 518 ml  Output 1 ml  Net 517 ml    Additional Objective Labs: Basic Metabolic Panel: Recent Labs  Lab 08/13/20 0739 08/13/20 1547 08/14/20 0528 08/15/20 0504  NA 129*  --  127* 127*  K 2.6* 3.7 3.6 3.0*  CL 92*  --  92* 92*  CO2 27  --  28 26  GLUCOSE 117*  --  119* 91  BUN 24*  --  23 24*  CREATININE 2.68*  --  2.83* 2.80*  CALCIUM 8.4*  8.2*  --  8.2* 8.1*  PHOS 3.6  --  3.3 3.6   Liver Function Tests: Recent Labs  Lab 08/13/20 0739 08/14/20 0528 08/15/20 0504  ALBUMIN 1.2* 1.1* 1.0*   CBC: Recent Labs  Lab 08/11/20 1138 08/13/20 0739  WBC 10.4 8.2  NEUTROABS 7.9*  --   HGB 10.7* 10.8*  HCT 33.2* 33.4*  MCV 98.2 98.8  PLT 345 348   Blood Culture    Component Value Date/Time   SDES PERITONEAL FLUID 08/13/2020 0500   SPECREQUEST NONE 08/13/2020 0500   CULT  08/13/2020 0500    NO GROWTH 2 DAYS Performed at [facility], [place] [address], [place], [place] [address]    REPTSTATUS PENDING 08/13/2020 0500   Lab Results  Component Value Date   INR 1.1 06/19/2020   INR 1.2 04/04/2019   INR 1.0 04/03/2019   Medications: . dialysis solution 1.5% low-MG/low-CA    . dialysis solution 2.5% low-MG/low-CA     . aspirin EC  81 mg Oral Daily  . atorvastatin  40 mg Oral Daily  . brivaracetam  75 mg Oral BID  . butalbital-acetaminophen-caffeine  1 tablet Oral TID with meals  . citalopram  10 mg Oral Daily  . darbepoetin (ARANESP) injection - NON-DIALYSIS  60 mcg Subcutaneous Q Tue-1800  . docusate sodium  100 mg Oral BID  . gentamicin cream  1 application Topical Daily  . heparin  5,000 Units Subcutaneous Q12H  . kidney failure book   Does not apply Once  . lacosamide  75 mg Oral BID  . melatonin  5 mg Oral QHS  . metoCLOPramide  5 mg Oral TID AC  . metoprolol tartrate  6.25 mg Oral BID  . multivitamin  1 tablet Oral QHS  . pantoprazole  40 mg Oral Daily  . triamcinolone cream  Topical TID    Dialysis Orders: [facility] followed by Dr. Candiss Norse ([place]). 4 exchanges overnight, 2000 cc and 1.5 hrs each.   Assessment/Plan: 1. Concern for UTI: UA looks good, she is asymptomatic 2. Debility: 2/2 Multiple med issues, most recently SDH recovery and nstemi w/ CAD and low EF. Readmitted to CIR on 08/04/20. 3. Fall w/ subdural hematoma - sp SDH evacuation at [facility] [place] on3/24/22 4. Nausea/ vomiting - etiology unclear, uremiaunlikely. PD cell count in the past and culture unrevealing, Preliminary report of body fluid culture shows ngtd; awaiting final report. 5. Malnutrition/ debility - low albumin, very weak/tired. It has been d/w husband on several occassions in the past about a possible trial of HD due to low albumin in a PD patient. He had stated in the past they are not interested and feels she would not tolerate HD. 6. Vol overload-resolved - sig LE dependent edema and UE edemaon admission. Using all 1 .5%.  Patient remains euvolemic on exam today. Will monitor. 7. ESRD -[facility] [place], [place], f/b Dr Candiss Norse of [place].Cont PD nightly4x 2000cc. Blood effluent seen on takedown 4/15 but has not had any more hence UA checked, if it happens again then will need AXR.PD gram stain 4/13 looks good cultures pending. 3/23 cell count and culture unrevealing 8. Multivessel CAD- with reduced EF 25-30% by recent cath in Feb 2022. Per TCTSwasnotCABG candidate. UnderwentPCI to LAD. At [facility] per family showed improved EF 55%. 9. Chronichyponatremia - worsening, Na at 127. Issues with N/V and recent SDH more likely a contributing factor. Will be hard to interpret her labs given that she is on renal replacement therapy but nonetheless will check serum osm, serum osm, urine lytes. Continue with fluid restriction, encourage solute intake. 10. Metabolic acidosis - Bicarb trending up; now at 26. Monitor for now and restart sodium bicarb as needed.  11. HTN -BP soft today but otherwise stable. Continue to monitor trend. Continue lopressor 6.25mg  BID 12. Anemiaof ESRD-was gettingAranesp 100 MCG q Satwhile herelast admit.  Aranesp r/s'ed, hgb now at 10.8. 13. Nutrition:Alb very low, 1.0. Renal diet w/fluid restrictions.Protein supplements.  14. Seizures -per primary team 15. H/oC diff -rx'd in Feb 2022 16. FTT - overall poor prognosis  Gean Quint MD [place] 08/16/2020,8:44 AM  LOS: 12 days

## 2020-08-17 DIAGNOSIS — S065X9A Traumatic subdural hemorrhage with loss of consciousness of unspecified duration, initial encounter: Secondary | ICD-10-CM | POA: Diagnosis not present

## 2020-08-17 MED ORDER — BUTALBITAL-APAP-CAFFEINE 50-325-40 MG PO TABS
1.0000 | ORAL_TABLET | Freq: Three times a day (TID) | ORAL | Status: DC | PRN
  Administered 2020-08-17 – 2020-08-31 (×18): 1 via ORAL
  Filled 2020-08-17 (×22): qty 1

## 2020-08-17 NOTE — Progress Notes (Signed)
[facility] PHYSICAL MEDICINE & REHABILITATION PROGRESS NOTE  Subjective/Complaints: Had migraine last night- added prn fioricet for her. Only required one of the PRNs last night- will decrease frequency of the prns to q8H. Nausea is improved.  Has been eating some nuts for protein  ROS: - Nausea , -vomiting.  Denies CP, SOB, diarrhea, headache currently well controlled  Objective: Vital Signs: Blood pressure 114/61, pulse 86, temperature 98.3 F (36.8 C), temperature source Oral, resp. rate 15, height 5\' 4"  (1.626 m), weight 46.3 kg, SpO2 100 %. No results found. No results for input(s): WBC, HGB, HCT, PLT in the last 72 hours. Recent Labs    08/15/20 0504  NA 127*  K 3.0*  CL 92*  CO2 26  GLUCOSE 91  BUN 24*  CREATININE 2.80*  CALCIUM 8.1*    Intake/Output Summary (Last 24 hours) at 08/17/2020 1421 Last data filed at 08/17/2020 1307 Gross per 24 hour  Intake 580 ml  Output 100 ml  Net 480 ml        Physical Exam: BP 114/61 (BP Location: Left Arm)   Pulse 86   Temp 98.3 F (36.8 C) (Oral)   Resp 15   Ht 5\' 4"  (1.626 m)   Wt 46.3 kg   SpO2 100%   BMI 17.52 kg/m  Gen: no distress, normal appearing HEENT: oral mucosa pink and moist, NCAT Cardio: Reg rate Chest: normal effort, normal rate of breathing Abd: soft, non-distended Ext: no edema Psych: pleasant, normal affect Skin: intact Neuro: Alert Motor: LUE: 4-4+/5 proximal distal, unchanged RUE: 4 --4/5 proximal distal, stable LLE: Hip flexion 2/5, knee extension 2/5, ankle dorsiflexion 3/5, improving RLE: Hip flexion 2/5, knee extension 2/5, ankle dorsiflexion 3/5, improving  Assessment/Plan: 1. Functional deficits which require 3+ hours per day of interdisciplinary therapy in a comprehensive inpatient rehab setting.  Physiatrist is providing close team supervision and 24 hour management of active medical problems listed below.  Physiatrist and rehab team continue to assess barriers to discharge/monitor  patient progress toward functional and medical goals   Care Tool:  Bathing    Body parts bathed by patient: Right arm,Left arm,Chest,Abdomen,Front perineal area,Face,Right upper leg,Left upper leg,Buttocks,Right lower leg,Left lower leg   Body parts bathed by helper: Buttocks     Bathing assist Assist Level: Minimal Assistance - Patient > 75%     Upper Body Dressing/Undressing Upper body dressing   What is the patient wearing?: Pull over shirt    Upper body assist Assist Level: Minimal Assistance - Patient > 75%    Lower Body Dressing/Undressing Lower body dressing      What is the patient wearing?: Pants     Lower body assist Assist for lower body dressing: Total Assistance - Patient < 25%     Toileting Toileting    Toileting assist Assist for toileting: Total Assistance - Patient < 25%     Transfers Chair/bed transfer  Transfers assist     Chair/bed transfer assist level: Moderate Assistance - Patient 50 - 74% Chair/bed transfer assistive device: Armrests   Locomotion Ambulation   Ambulation assist   Ambulation activity did not occur: Safety/medical concerns  Assist level: Moderate Assistance - Patient 50 - 74% Assistive device: Parallel bars Max distance: 1   Walk 10 feet activity   Assist  Walk 10 feet activity did not occur: Safety/medical concerns        Walk 50 feet activity   Assist Walk 50 feet with 2 turns activity did not occur: Safety/medical  concerns         Walk 150 feet activity   Assist Walk 150 feet activity did not occur: Safety/medical concerns         Walk 10 feet on uneven surface  activity   Assist Walk 10 feet on uneven surfaces activity did not occur: Safety/medical concerns         Wheelchair     Assist Will patient use wheelchair at discharge?: Yes Type of Wheelchair: Manual    Wheelchair assist level: Supervision/Verbal cueing Max wheelchair distance: 9'    Wheelchair 50 feet with 2  turns activity    Assist    Wheelchair 50 feet with 2 turns activity did not occur: Safety/medical concerns   Assist Level: Supervision/Verbal cueing   Wheelchair 150 feet activity     Assist  Wheelchair 150 feet activity did not occur: Safety/medical concerns        Medical Problem List and Plan: 1.  Right hemiparesis and aphasia secondary to large left SDH s/p craniotomy  Continue CIR PT, OT, SLP 2.  Impaired mobility -DVT/anticoagulation:  Mechanical: Continue Sequential compression devices, below knee Bilateral lower extremities             -antiplatelet therapy: N/A due to bleed.  3. Chronic migraines/ Pain Management: Continue Fioricet 3-4 times a day on average. Added prn Fioricet q8H  Controlled with meds on 4/17  Monitor with increased exertion 4. Mood: LCSW to follow for evaluation and support.              -antipsychotic agents: N/A 5. Neuropsych: This patient is not fully capable of making decisions on her own behalf. 6. Skin/Wound Care: Monitor wound for healing.             -Family to provide meals from home as able.  -bilateral PRAFOs ordered but patient refuses to wear 7. Fluids/Electrolytes/Nutrition: Continue PD--does not need fluid restriction.              --has had chronic issues with variable intake as well as N/V likely due to gastroparesis. D/ced protein supplements and has improved.  8. Chronic Hypotension: ContinueTEDs when out of bed             Vitals:   08/17/20 0407 08/17/20 1321  BP: 106/69 114/61  Pulse: 73 86  Resp: 16 15  Temp: 98.2 F (36.8 C) 98.3 F (36.8 C)  SpO2: 100% 100%  Normotensive on 4/17 9.  ESRD on PD: Nephrology to assist/manage PD.  10.  Recurrent Seizures: Has been seizure free, continue Brivaracetam and Lacosamide bid.              --Lacosamide was decreased on 03/31 to avoid sedative SE.  No breakthrough seizures since admission-4/16 11. CAD/NSTEMI s/p PCI: Treated medically.   Off DAPT due to bleed              --Continue Lipitor.   Metoprolol low-dose resumed , HR controlled 12. Acute on chronic anemia:   Hemoglobin 10.0 on 4/8  Continue to monitor 13. H/o Depression: Continue Celexa 14. Sick Euthyroid: Will need follow up as illness resolves.              --TSH- 5.80/Free T4-1.2 (03/27) 15. Gastroparesis w/chronic N/V/anorexia: Has been ongoing per records review.              --question failure of PD (husband reports no N/V with brief episode of HD in the past)  Continue Reglan 5 3 times daily,  vomiting resolved still has some nausea  Hesitant to continue increase medications due to #18  Will discuss with nephrology 16.  Hyponatremia             --chronic hyponatremia with Na-130-133 range  Sodium 130 on 4/5, 127 on 4/14             --Continue Sodium bicarb to manage metabolic acidosis. 17.  Severe hypoalbuminemia  Supplement d/ced given nausea and vomiting- recommended nuts as source of protein. Her daughter has brought in.  18.  Prolonged QTC  ECG reviewed, improved since last visit  Compazine and dose/frequency decreased    LOS: 13 days A FACE TO FACE EVALUATION WAS PERFORMED  Juvon Teater P Belita Warsame 08/17/2020, 2:21 PM

## 2020-08-17 NOTE — Progress Notes (Signed)
[facility] Progress Note   Subjective:    Seen and examined bedside. No new issues/complaints as per patient. No bloody effluent today again. uf not documented this am?  Objective Vitals:   08/16/20 1321 08/16/20 1949 08/17/20 0407 08/17/20 0758  BP: 120/67 123/70 106/69   Pulse: 80 79 73   Resp: 16 18 16    Temp: 97.6 F (36.4 C) 98.3 F (36.8 C) 98.2 F (36.8 C)   TempSrc: Oral Oral Oral   SpO2: 100% 99% 100%   Weight:    46.3 kg  Height:       Physical Exam General: Appears comfortable; No acute respiratory distress Heart: Normal S1 and S2; No murmurs, gallops, or friction rub Lungs: Clear throughout w/o wheezing, rales, or rhonchi Abdomen: Soft, non-tender to palpation, active bowel sounds Extremities: No edema bilateral lower extremities Dialysis Access: PD cath; Dressing CDI  Filed Weights   08/16/20 0500 08/16/20 0700 08/17/20 0758  Weight: 47.7 kg 46.3 kg 46.3 kg    Intake/Output Summary (Last 24 hours) at 08/17/2020 0852 Last data filed at 08/17/2020 0744 Gross per 24 hour  Intake 520 ml  Output --  Net 520 ml    Additional Objective Labs: Basic Metabolic Panel: Recent Labs  Lab 08/13/20 0739 08/13/20 1547 08/14/20 0528 08/15/20 0504  NA 129*  --  127* 127*  K 2.6* 3.7 3.6 3.0*  CL 92*  --  92* 92*  CO2 27  --  28 26  GLUCOSE 117*  --  119* 91  BUN 24*  --  23 24*  CREATININE 2.68*  --  2.83* 2.80*  CALCIUM 8.4*  8.2*  --  8.2* 8.1*  PHOS 3.6  --  3.3 3.6   Liver Function Tests: Recent Labs  Lab 08/13/20 0739 08/14/20 0528 08/15/20 0504  ALBUMIN 1.2* 1.1* 1.0*   CBC: Recent Labs  Lab 08/11/20 1138 08/13/20 0739  WBC 10.4 8.2  NEUTROABS 7.9*  --   HGB 10.7* 10.8*  HCT 33.2* 33.4*  MCV 98.2 98.8  PLT 345 348   Blood Culture    Component Value Date/Time   SDES URINE, CLEAN CATCH 08/15/2020 1209   SPECREQUEST  08/15/2020 1209    NONE Performed at [facility], [place] [address], [place],  [address]     CULT MULTIPLE SPECIES PRESENT, SUGGEST RECOLLECTION (A) 08/15/2020 1209   REPTSTATUS 08/16/2020 FINAL 08/15/2020 1209   Lab Results  Component Value Date   INR 1.1 06/19/2020   INR 1.2 04/04/2019   INR 1.0 04/03/2019   Medications: . dialysis solution 1.5% low-MG/low-CA    . dialysis solution 2.5% low-MG/low-CA     . aspirin EC  81 mg Oral Daily  . atorvastatin  40 mg Oral Daily  . brivaracetam  75 mg Oral BID  . butalbital-acetaminophen-caffeine  1 tablet Oral TID with meals  . citalopram  10 mg Oral Daily  . darbepoetin (ARANESP) injection - NON-DIALYSIS  60 mcg Subcutaneous Q Tue-1800  . docusate sodium  100 mg Oral BID  . gentamicin cream  1 application Topical Daily  . heparin  5,000 Units Subcutaneous Q12H  . kidney failure book   Does not apply Once  . lacosamide  75 mg Oral BID  . melatonin  5 mg Oral QHS  . metoCLOPramide  5 mg Oral TID AC  . metoprolol tartrate  6.25 mg Oral BID  . multivitamin  1 tablet Oral QHS  . pantoprazole  40 mg Oral Daily  .  triamcinolone cream   Topical TID    Dialysis Orders: [place] followed by Dr. Candiss Norse ([place]). 4 exchanges overnight, 2000 cc and 1.5 hrs each.   Assessment/Plan: 1. Concern for UTI: UA looks good, she is asymptomatic, culture inconclusive  2. Debility: 2/2 Multiple med issues, most recently SDH recovery and nstemi w/ CAD and low EF. Readmitted to CIR on 08/04/20. 3. Fall w/ subdural hematoma - sp SDH evacuation at [facility] on3/24/22 4. Nausea/ vomiting - etiology unclear, uremiaunlikely. PD cell count in the past and culture unrevealing, Preliminary report of body fluid culture shows ngtd; awaiting final report. 5. Malnutrition/ debility - low albumin, very weak/tired. It has been d/w husband on several occassions in the past about a possible trial of HD due to low albumin in a PD patient. He had stated in the past they are not interested and feels she would not tolerate HD. 6. Vol  overload-resolved - sig LE dependent edema and UE edemaon admission. Using all 1 .5%. Patient remains euvolemic on exam today. Will monitor. 7. ESRD -[facility] [place], [place], f/b Dr Candiss Norse of [place].Cont PD nightly4x 2000cc. Blood effluent seen on takedown 4/15 but has not had any more hence UA checked as above, if it happens again then will need AXR.PD gram stain 4/13 looks good cultures pending. 3/23 cell count and culture unrevealing 8. Multivessel CAD- with reduced EF 25-30% by recent cath in Feb 2022. Per TCTSwasnotCABG candidate. UnderwentPCI to LAD. At [facility] repeat echo per family showed improved EF 55%. 9. Chronichyponatremia - worsening, Na at 127. Issues with N/V and recent SDH along with n/v more likely a contributing factor. Monitor for now, uf as toelrated with PD 10. Metabolic acidosis -  Monitor for now and restart sodium bicarb as needed. No nahco3 supplementation 11. HTN -monitor. Continue lopressor 6.25mg  BID 12. Anemiaof ESRD-was gettingAranesp 100 MCG q Satwhile herelast admit.  Aranesp r/s'ed, hgb now at 10.8. 13. Nutrition:Alb very low. Renal diet w/fluid restrictions.Protein supplements.  14. Seizures -per primary team 15. H/oC diff -rx'd in Feb 2022 16. FTT - overall poor prognosis  Gean Quint MD [place] 08/17/2020,8:52 AM  LOS: 13 days

## 2020-08-18 DIAGNOSIS — S065X9A Traumatic subdural hemorrhage with loss of consciousness of unspecified duration, initial encounter: Secondary | ICD-10-CM | POA: Diagnosis not present

## 2020-08-18 LAB — CBC
HCT: 30.2 % — ABNORMAL LOW (ref 36.0–46.0)
Hemoglobin: 10 g/dL — ABNORMAL LOW (ref 12.0–15.0)
MCH: 32.7 pg (ref 26.0–34.0)
MCHC: 33.1 g/dL (ref 30.0–36.0)
MCV: 98.7 fL (ref 80.0–100.0)
Platelets: 353 10*3/uL (ref 150–400)
RBC: 3.06 MIL/uL — ABNORMAL LOW (ref 3.87–5.11)
RDW: 15.6 % — ABNORMAL HIGH (ref 11.5–15.5)
WBC: 7.7 10*3/uL (ref 4.0–10.5)
nRBC: 0 % (ref 0.0–0.2)

## 2020-08-18 LAB — BASIC METABOLIC PANEL
Anion gap: 7 (ref 5–15)
BUN: 23 mg/dL (ref 8–23)
CO2: 26 mmol/L (ref 22–32)
Calcium: 8.3 mg/dL — ABNORMAL LOW (ref 8.9–10.3)
Chloride: 91 mmol/L — ABNORMAL LOW (ref 98–111)
Creatinine, Ser: 2.84 mg/dL — ABNORMAL HIGH (ref 0.44–1.00)
GFR, Estimated: 17 mL/min — ABNORMAL LOW (ref >60)
Glucose, Bld: 86 mg/dL (ref 70–99)
Potassium: 3.6 mmol/L (ref 3.5–5.1)
Sodium: 124 mmol/L — ABNORMAL LOW (ref 135–145)

## 2020-08-18 MED ORDER — SODIUM CHLORIDE 1 G PO TABS
1.0000 g | ORAL_TABLET | Freq: Two times a day (BID) | ORAL | Status: DC
  Administered 2020-08-18 – 2020-08-25 (×14): 1 g via ORAL
  Filled 2020-08-18 (×14): qty 1

## 2020-08-18 MED ORDER — GENTAMICIN SULFATE 0.1 % EX CREA
1.0000 "application " | TOPICAL_CREAM | Freq: Every day | CUTANEOUS | Status: DC
  Administered 2020-08-19: 1 via TOPICAL
  Filled 2020-08-18: qty 15

## 2020-08-18 MED ORDER — DELFLEX-LC/1.5% DEXTROSE 344 MOSM/L IP SOLN: INTRAPERITONEAL | Status: DC

## 2020-08-18 MED ORDER — HEPARIN 1000 UNIT/ML FOR PERITONEAL DIALYSIS: 500.0000 [IU] | INTRAMUSCULAR | Status: DC | PRN

## 2020-08-18 MED ORDER — HEPARIN 1000 UNIT/ML FOR PERITONEAL DIALYSIS
INTRAPERITONEAL | Status: DC | PRN
  Filled 2020-08-18: qty 5000

## 2020-08-18 NOTE — Plan of Care (Signed)

## 2020-08-18 NOTE — Progress Notes (Signed)
Occupational Therapy Session Note  Patient Details  Name: Amy [name] MRN: [id number] Date of Birth: 1950/09/05  Today's Date: 08/18/2020 OT Individual Time: 1100-1157 OT Individual Time Calculation (min): 57 min    Short Term Goals: Week 2:  OT Short Term Goal 1 (Week 2): patient will complete bathing/dressing tasks edge of bed with min A/CS OT Short Term Goal 2 (Week 2): patient will complete functional transfers mod A OT Short Term Goal 3 (Week 2): patient will tolerate sitting OOB for 1 hour daily  Skilled Therapeutic Interventions/Progress Updates:    Patient in bed, daughter present for session.  Patient on bed pan.  Min A /set up to complete hygiene, max A to place clean brief.  Dependent to donn bilateral leg ace wraps and slipper socks.  Rolling in bed with min A.  Side lying to sitting with mod A.  She is able to tolerate unsupported sitting with CS.  Min A for scooting on bed.  Sit to stand and SPT to w/c mod/max A with tactile cues/facilitation for stepping.  Completed LB dressing w.c level - mod A over legs, standing next to bed with right railing mod/maxA with max A to pull pants over hips.  Oral care w/c level mod I.  OH shirt min A.  SPT to recliner with maxA.  She remained in recliner at close of session.  Call bell in reach.  Daughter to stay for the remainder of the day.    Therapy Documentation Precautions:  Precautions Precautions: Fall Precaution Comments: seizure precautions Restrictions Weight Bearing Restrictions: No Other Position/Activity Restrictions: head incision   Therapy/Group: Individual Therapy  Carlos Levering 08/18/2020, 7:37 AM

## 2020-08-18 NOTE — Progress Notes (Signed)
[facility] Progress Note   Subjective:   Had PD overnight with net UF 1.4L, clear effluent per nursing notes. Na down to 124 this AM. No SOB, CP, palpitations, dizziness. Appetite and nausea better this AM. Daughter visiting.   Objective Vitals:   08/17/20 1946 08/18/20 0433 08/18/20 0721 08/18/20 0830  BP: 120/70 133/78 126/72 127/63  Pulse: 75 77 81 82  Resp: 16 17  16   Temp: 98.3 F (36.8 C) 98 F (36.7 C)  (!) 97.4 F (36.3 C)  TempSrc: Oral   Oral  SpO2: 100% 100%  99%  Weight:      Height:       Physical Exam General: Seated on chair at bedside, alert and in NAD Heart: RRR, no murmurs, rubs or gallops Lungs: CTA bilaterally without wheezing, rhonchi or rales Abdomen: Soft, non-tender, non-distended, +BS Extremities: No edema bilateral lower extremities Dialysis Access: PD cath in abdomen, no erythema, drainage or tenderness  Additional Objective Labs: Basic Metabolic Panel: Recent Labs  Lab 08/13/20 0739 08/13/20 1547 08/14/20 0528 08/15/20 0504 08/18/20 0612  NA 129*  --  127* 127* 124*  K 2.6*   < > 3.6 3.0* 3.6  CL 92*  --  92* 92* 91*  CO2 27  --  28 26 26   GLUCOSE 117*  --  119* 91 86  BUN 24*  --  23 24* 23  CREATININE 2.68*  --  2.83* 2.80* 2.84*  CALCIUM 8.4*  8.2*  --  8.2* 8.1* 8.3*  PHOS 3.6  --  3.3 3.6  --    < > = values in this interval not displayed.   Liver Function Tests: Recent Labs  Lab 08/13/20 0739 08/14/20 0528 08/15/20 0504  ALBUMIN 1.2* 1.1* 1.0*   CBC: Recent Labs  Lab 08/11/20 1138 08/13/20 0739 08/18/20 0612  WBC 10.4 8.2 7.7  NEUTROABS 7.9*  --   --   HGB 10.7* 10.8* 10.0*  HCT 33.2* 33.4* 30.2*  MCV 98.2 98.8 98.7  PLT 345 348 353   Blood Culture    Component Value Date/Time   SDES URINE, CLEAN CATCH 08/15/2020 1209   SPECREQUEST  08/15/2020 1209    NONE Performed at [facility], [place] [address], [place], [place] [address]    CULT MULTIPLE SPECIES PRESENT, SUGGEST RECOLLECTION  (A) 08/15/2020 1209   REPTSTATUS 08/16/2020 FINAL 08/15/2020 1209   Medications: . dialysis solution 1.5% low-MG/low-CA    . dialysis solution 2.5% low-MG/low-CA     . aspirin EC  81 mg Oral Daily  . atorvastatin  40 mg Oral Daily  . brivaracetam  75 mg Oral BID  . butalbital-acetaminophen-caffeine  1 tablet Oral TID with meals  . citalopram  10 mg Oral Daily  . darbepoetin (ARANESP) injection - NON-DIALYSIS  60 mcg Subcutaneous Q Tue-1800  . docusate sodium  100 mg Oral BID  . gentamicin cream  1 application Topical Daily  . heparin  5,000 Units Subcutaneous Q12H  . kidney failure book   Does not apply Once  . lacosamide  75 mg Oral BID  . melatonin  5 mg Oral QHS  . metoCLOPramide  5 mg Oral TID AC  . metoprolol tartrate  6.25 mg Oral BID  . multivitamin  1 tablet Oral QHS  . pantoprazole  40 mg Oral Daily  . sodium chloride  1 g Oral BID WC  . triamcinolone cream   Topical TID    Dialysis Orders: [place] followed  by Dr. Candiss Norse ([place]). 4 exchanges overnight, 2000 cc and 1.5 hrs each.   Assessment/Plan: 1. Concern for UTI: UA looks good, she is asymptomatic, culture inconclusive. Not on antibiotics at present, low suspicion for UTI.   2. Debility: 2/2 Multiple med issues, most recently SDH recovery and nstemi w/ CAD and low EF. Readmitted to CIR on 08/04/20. 3. Fall w/ subdural hematoma - sp SDH evacuation at [facility] on3/24/22 4. Nausea/ vomiting - etiology unclear, uremiaunlikely.PD cell count in the past and culture unrevealing, Preliminary report of body fluid culture shows ngtd and effluent has been clear.  5. Malnutrition/ debility - very low albumin, very weak/tired. It has been d/w husband on several occassions in the past about a possible trial of HD due to low albumin in a PD patient. He had stated in the past they are not interested and feels she would not tolerate HD. 6. Vol overload-resolved- sig LE dependent edema and UE edemaon admission.  Volume status overall improved since admission and patient is euvolemic on exam. Had net UF 1.4L last night with all 1.5% dextrose exchanges, will continue the same tonight.  7. ESRD -[facility]PDin [place], [place], f/b Dr Candiss Norse of [place].Cont PD nightly4x 2000cc. Blood effluent seen on takedown 4/15 but has not had any more since.PD gram stain, cultures and cell count not consistent with infection.  8. Multivessel CAD- with reduced EF 25-30% by recent cath in Feb 2022. Per TCTSwasnotCABG candidate. UnderwentPCI to LAD. At [facility] repeat echo per family showed improved EF 55%. 9. Chronichyponatremia -worsening, Na at 124. Issues with N/V and poor PO intake. Suspect this is a PO intake issue more so than hypervolemia. Discussed with Dr. Jonnie Finner and will start PO NaCl BID, follow sodium level daily.  10. Metabolic acidosis -CO2 26, not on sodium bicarbonate supplementation at present.   11. HTN -BP stable. Continue lopressor 6.25mg  BID 12. Anemiaof ESRD-Hemoglobin 10.0. Continue aranesp.  14. H/o Seizures -per primary team 15. H/oC diff -rx'd in Feb 2022 16. FTT - overall poor prognosis   Anice Paganini, PA-C 08/18/2020, 11:11 AM  [facility] Pager: [phone]

## 2020-08-18 NOTE — Progress Notes (Signed)
Physical Therapy Session Note  Patient Details  Name: Amy [name] MRN: [id number] Date of Birth: 1950-06-20  Today's Date: 08/18/2020 PT Individual Time: 1415-1530 PT Individual Time Calculation (min): 75 min   Short Term Goals: Week 2:  PT Short Term Goal 1 (Week 2): Patient will perform bed<>chair transfers with mod A. PT Short Term Goal 2 (Week 2): Patient will ambulate 7' in parallel bars with mod A PT Short Term Goal 3 (Week 2): Patient will propel w/c 43' with S. PT Short Term Goal 4 (Week 2): Patient will tolerate standing 1 minute for functional task.  Skilled Therapeutic Interventions/Progress Updates:    Patient in room with daughter present.  Finishing with SLP and reports recently back to bed after sitting up for lunch.  Patient supine for therex including ankle pumps, SAQ w/ 5 sec hold, hip abduction, SLR w/A, bridging, and hooklying hip abduction with orange t-band all x 10.  Patient supine to sit with mod A.  Seated EOB several seconds to decrease dizzy symptoms.  Performed stand pivot to [facility] with +2 A needed to doff pants and brief.  Patient attempted hygiene, but soiled hand so assisted for hygiene with leaning forward on BSC, but unable to clean completely so pivot to bed with mod to max A.  Sit to supine mod A for LE's and assisted with hygiene and completed donning new brief and pants with +2 A.  Patient supine to sit with mod A.  Seated EOB x 6 minutes while rehab tech locating shorter walker.  Patient fatigued sitting EOB so provided min A for balance, then squat pivot to w/c mod a.  Patient sit to stand with +2 A to RW and ambulated 2 x 8' with RW and +2 A for safety and w/c follow.  Patient sit to supine mod A and scooted up with +2 A so placed in chair position after ace wraps and pants doffed.  Left with call bell in reach, bed alarm active and with daughter in the room.   Therapy Documentation Precautions:  Precautions Precautions: Fall Precaution Comments: seizure  precautions Restrictions Weight Bearing Restrictions: No Other Position/Activity Restrictions: head incision Pain: Pain Assessment Pain Score: 0-No pain    Therapy/Group: Individual Therapy  Reginia Naas  [place], PT 08/18/2020, 5:18 PM

## 2020-08-18 NOTE — Progress Notes (Signed)
Speech Language Pathology Weekly Progress and Session Note  Patient Details  Name: Amy [name] MRN: [id number] Date of Birth: December 22, 1950  Beginning of progress report period: August 11, 2020 End of progress report period: August 18, 2020  Today's Date: 08/18/2020 SLP Individual Time: 1352-1415 SLP Individual Time Calculation (min): 23 min  Short Term Goals: Week 2: SLP Short Term Goal 1 (Week 2): Patient will utilize external memory aids to recall new, daily information with Min A verbal cues. SLP Short Term Goal 1 - Progress (Week 2): Met SLP Short Term Goal 2 (Week 2): Pt will demonstrate sustained attention in functional tasks in 30 minue intervals with min A verbal cues for redirection. SLP Short Term Goal 2 - Progress (Week 2): Met SLP Short Term Goal 3 (Week 2): Pt will demonstrate mildly complex problem solving skills in functional tasks with min A verbal cues. SLP Short Term Goal 3 - Progress (Week 2): Not met SLP Short Term Goal 4 (Week 2): Pt will demonstrate self-monitor and self-correction in functional errors with min A verbal cues. SLP Short Term Goal 4 - Progress (Week 2): Not met    New Short Term Goals: Week 3: SLP Short Term Goal 1 (Week 3): Patient will utilize external memory aids to recall new, daily information with supervision A verbal cues. SLP Short Term Goal 2 (Week 3): Pt will demonstrate sustained attention in functional tasks in 30 minue intervals with supervision A verbal cues for redirection. SLP Short Term Goal 3 (Week 3): Pt will demonstrate mildly complex problem solving skills in functional tasks with min A verbal cues. SLP Short Term Goal 4 (Week 3): Pt will demonstrate self-monitor and self-correction in functional errors with min A verbal cues.  Weekly Progress Updates: Pt made moderate progress meeting 2 out 4 goals this reporting period. Pt demonstrated improvements in short term recall with use of memory notebook and increased sustained attention,  primarily impacted by memory deficits. Pt continued to need increasing cuing for mildly complex problem solving and error awareness within tasks. Pt would continue to benefit from skilled ST services in order to maximize functional independence and reduce burden of care, requiring 24 hour supervision at discharge with continued skilled ST services.      Intensity: Minumum of 1-2 x/day, 30 to 90 minutes Frequency: 3 to 5 out of 7 days Duration/Length of Stay: 09/02/20 Treatment/Interventions: Cognitive remediation/compensation;Cueing hierarchy;Functional tasks;Medication managment;Patient/family education;Internal/external aids;Therapeutic Activities;Environmental controls   Daily Session  Skilled Therapeutic Interventions: Skilled ST services focused on cognitive skills. [name] was present. Pt requested to be transferred from recliner to bed, Pt required mod assist via stedy. SLP facilitated mildly complex problem solving and error awareness in calendar task. Pt demonstrated basic problem solving skills to apply direct information from list to calendar with supervision A verbal cues, however when the appiontment was mildly complex pt required max A verbal cues to decode/process information. Task was not completed due to time limitations and SLP encourage daughter to assist pt in finishing task if desired. Pt was left in room with call bell within reach and bed alarm set. SLP recommends to continue skilled services.     General    Pain Pain Assessment Pain Score: 0-No pain  Therapy/Group: Individual Therapy  Tanae Petrosky  [facility] 08/18/2020, 4:25 PM

## 2020-08-18 NOTE — Progress Notes (Addendum)
[place] PHYSICAL MEDICINE & REHABILITATION PROGRESS NOTE  Subjective/Complaints:  Spoke with pt an ddaughter regarding family concerns that pt is "giving up"  Daughter states pt sometimes refuses to get OOB< pt denies this.  Has been seen by Neuropsych  ROS: - Nausea , -vomiting.  Denies CP, SOB, diarrhea, headache currently well controlled  Objective: Vital Signs: Blood pressure 127/63, pulse 82, temperature (!) 97.4 F (36.3 C), temperature source Oral, resp. rate 16, height 5\' 4"  (1.626 m), weight 50.4 kg, SpO2 99 %. No results found. Recent Labs    08/18/20 0612  WBC 7.7  HGB 10.0*  HCT 30.2*  PLT 353   Recent Labs    08/18/20 0612  NA 124*  K 3.6  CL 91*  CO2 26  GLUCOSE 86  BUN 23  CREATININE 2.84*  CALCIUM 8.3*    Intake/Output Summary (Last 24 hours) at 08/18/2020 0956 Last data filed at 08/17/2020 1700 Gross per 24 hour  Intake 360 ml  Output 100 ml  Net 260 ml        Physical Exam: BP 127/63 (BP Location: Right Arm)   Pulse 82   Temp (!) 97.4 F (36.3 C) (Oral)   Resp 16   Ht 5\' 4"  (1.626 m)   Wt 50.4 kg   SpO2 99%   BMI 19.07 kg/m   General: No acute distress Mood and affect are appropriate Heart: Regular rate and rhythm no rubs murmurs or extra sounds Lungs: Clear to auscultation, breathing unlabored, no rales or wheezes Abdomen: Positive bowel sounds, soft nontender to palpation, nondistended Extremities: No clubbing, cyanosis, or edema Skin: No evidence of breakdown, no evidence of rash Motor: LUE: 4-4+/5 proximal distal, unchanged RUE: 4 --4/5 proximal distal, stable LLE: Hip flexion 2/5, knee extension 2/5, ankle dorsiflexion 3/5, improving RLE: Hip flexion 2/5, knee extension 2/5, ankle dorsiflexion 3/5, improving  Assessment/Plan: 1. Functional deficits which require 3+ hours per day of interdisciplinary therapy in a comprehensive inpatient rehab setting.  Physiatrist is providing close team supervision and 24 hour management  of active medical problems listed below.  Physiatrist and rehab team continue to assess barriers to discharge/monitor patient progress toward functional and medical goals   Care Tool:  Bathing    Body parts bathed by patient: Right arm,Left arm,Chest,Abdomen,Front perineal area,Face,Right upper leg,Left upper leg,Buttocks,Right lower leg,Left lower leg   Body parts bathed by helper: Buttocks     Bathing assist Assist Level: Minimal Assistance - Patient > 75%     Upper Body Dressing/Undressing Upper body dressing   What is the patient wearing?: Pull over shirt    Upper body assist Assist Level: Minimal Assistance - Patient > 75%    Lower Body Dressing/Undressing Lower body dressing      What is the patient wearing?: Pants     Lower body assist Assist for lower body dressing: Total Assistance - Patient < 25%     Toileting Toileting    Toileting assist Assist for toileting: Total Assistance - Patient < 25%     Transfers Chair/bed transfer  Transfers assist     Chair/bed transfer assist level: Moderate Assistance - Patient 50 - 74% Chair/bed transfer assistive device: Armrests   Locomotion Ambulation   Ambulation assist   Ambulation activity did not occur: Safety/medical concerns  Assist level: Moderate Assistance - Patient 50 - 74% Assistive device: Parallel bars Max distance: 1   Walk 10 feet activity   Assist  Walk 10 feet activity did not occur: Safety/medical concerns  Walk 50 feet activity   Assist Walk 50 feet with 2 turns activity did not occur: Safety/medical concerns         Walk 150 feet activity   Assist Walk 150 feet activity did not occur: Safety/medical concerns         Walk 10 feet on uneven surface  activity   Assist Walk 10 feet on uneven surfaces activity did not occur: Safety/medical concerns         Wheelchair     Assist Will patient use wheelchair at discharge?: Yes Type of Wheelchair:  Manual    Wheelchair assist level: Supervision/Verbal cueing Max wheelchair distance: 66'    Wheelchair 50 feet with 2 turns activity    Assist    Wheelchair 50 feet with 2 turns activity did not occur: Safety/medical concerns   Assist Level: Supervision/Verbal cueing   Wheelchair 150 feet activity     Assist  Wheelchair 150 feet activity did not occur: Safety/medical concerns        Medical Problem List and Plan: 1.  Right hemiparesis and aphasia secondary to large left SDH s/p craniotomy  Continue CIR PT, OT, SLP 2.  Impaired mobility -DVT/anticoagulation:  Mechanical: Continue Sequential compression devices, below knee Bilateral lower extremities             -antiplatelet therapy: N/A due to bleed.  3. Chronic migraines/ Pain Management: Continue Fioricet 3-4 times a day on average. Added prn Fioricet q8H  Controlled with meds on 4/17  Monitor with increased exertion 4. Mood: LCSW to follow for evaluation and support. - ask for Neuropsych f/u            -antipsychotic agents: N/A 5. Neuropsych: This patient is not fully capable of making decisions on her own behalf. 6. Skin/Wound Care: Monitor wound for healing.             -Family to provide meals from home as able.  -bilateral PRAFOs ordered but patient refuses to wear 7. Fluids/Electrolytes/Nutrition: Continue PD--does not need fluid restriction.              --has had chronic issues with variable intake as well as N/V likely due to gastroparesis. D/ced protein supplements and has improved.  8. Chronic Hypotension: ContinueTEDs when out of bed             Vitals:   08/18/20 0721 08/18/20 0830  BP: 126/72 127/63  Pulse: 81 82  Resp:  16  Temp:  (!) 97.4 F (36.3 C)  SpO2:  99%  Normotensive on 4/18 9.  ESRD on PD: Nephrology to assist/manage PD.  10.  Recurrent Seizures: Has been seizure free, continue Brivaracetam and Lacosamide bid.              --Lacosamide was decreased on 03/31 to avoid sedative  SE.  No breakthrough seizures since admission-4/16 11. CAD/NSTEMI s/p PCI: Treated medically.   Off DAPT due to bleed             --Continue Lipitor.   Metoprolol low-dose resumed , HR controlled 12. Acute on chronic anemia:   Hemoglobin 10.0 on 4/8  Continue to monitor 13. H/o Depression: Continue Celexa 14. Sick Euthyroid: Will need follow up as illness resolves.              --TSH- 5.80/Free T4-1.2 (03/27) 15. Gastroparesis w/chronic N/V/anorexia: Has been ongoing per records review.              --question failure of PD (  husband reports no N/V with brief episode of HD in the past)  Continue Reglan 5 3 times daily, vomiting resolved still has some nausea  Hesitant to continue increase medications due to #18  No vomiting 4/18 16.  Hyponatremia             --chronic hyponatremia with Na-130-133 range  Sodium 130 on 4/5, 127 on 4/14             --Continue Sodium bicarb to manage metabolic acidosis. 17.  Severe hypoalbuminemia  Supplement d/ced given nausea and vomiting- recommended nuts as source of protein. Her daughter has brought in.  18.  Prolonged QTC  ECG reviewed, improved since last visit  Compazine and dose/frequency decreased 19.  Insomnia- "never a good sleeper" , on PD a night and gets disturbed by hospital sounds, cont trazodone and melatonin    LOS: 14 days A FACE TO [place] E Antara Brecheisen 08/18/2020, 9:56 AM

## 2020-08-19 DIAGNOSIS — G8918 Other acute postprocedural pain: Secondary | ICD-10-CM | POA: Diagnosis not present

## 2020-08-19 DIAGNOSIS — S065X3D Traumatic subdural hemorrhage with loss of consciousness of 1 hour to 5 hours 59 minutes, subsequent encounter: Secondary | ICD-10-CM | POA: Diagnosis not present

## 2020-08-19 DIAGNOSIS — S065X9A Traumatic subdural hemorrhage with loss of consciousness of unspecified duration, initial encounter: Secondary | ICD-10-CM | POA: Diagnosis not present

## 2020-08-19 DIAGNOSIS — G479 Sleep disorder, unspecified: Secondary | ICD-10-CM

## 2020-08-19 DIAGNOSIS — D649 Anemia, unspecified: Secondary | ICD-10-CM | POA: Diagnosis not present

## 2020-08-19 LAB — BASIC METABOLIC PANEL
Anion gap: 6 (ref 5–15)
BUN: 24 mg/dL — ABNORMAL HIGH (ref 8–23)
CO2: 27 mmol/L (ref 22–32)
Calcium: 8.1 mg/dL — ABNORMAL LOW (ref 8.9–10.3)
Chloride: 92 mmol/L — ABNORMAL LOW (ref 98–111)
Creatinine, Ser: 2.93 mg/dL — ABNORMAL HIGH (ref 0.44–1.00)
GFR, Estimated: 17 mL/min — ABNORMAL LOW (ref >60)
Glucose, Bld: 85 mg/dL (ref 70–99)
Potassium: 3.9 mmol/L (ref 3.5–5.1)
Sodium: 125 mmol/L — ABNORMAL LOW (ref 135–145)

## 2020-08-19 MED ORDER — GENTAMICIN SULFATE 0.1 % EX CREA
1.0000 "application " | TOPICAL_CREAM | Freq: Every day | CUTANEOUS | Status: DC
  Administered 2020-08-19 – 2020-08-20 (×2): 1 via TOPICAL
  Filled 2020-08-19: qty 15

## 2020-08-19 MED ORDER — HEPARIN 1000 UNIT/ML FOR PERITONEAL DIALYSIS: 500.0000 [IU] | INTRAMUSCULAR | Status: DC | PRN

## 2020-08-19 MED ORDER — DELFLEX-LC/1.5% DEXTROSE 344 MOSM/L IP SOLN: INTRAPERITONEAL | Status: DC

## 2020-08-19 NOTE — Progress Notes (Signed)
Per PA order 67 staples removed from left craniotomy incision. Patient tolerated well. 3 sutures remain. Will consult with PA on tomorrow on removal of those.

## 2020-08-19 NOTE — Telephone Encounter (Signed)
Attempted to schedule.  LMOV to call office.  ° °

## 2020-08-19 NOTE — Progress Notes (Signed)
[facility] Progress Note   Subjective:   Reports PD went well overnight. No abdominal pain, nausea, shortness of breath, chest pain or dizziness.   Objective Vitals:   08/18/20 1603 08/18/20 1931 08/19/20 0331 08/19/20 0756  BP: 117/68 121/70 121/71 129/70  Pulse: 83 75 73 75  Resp: 16 16 15    Temp: 98.2 F (36.8 C) (!) 97.5 F (36.4 C) 98.2 F (36.8 C)   TempSrc: Oral Oral    SpO2: 100% 98% 99%   Weight:      Height:       Physical Exam  General: well developed, thin female, alert and in NAD Heart: RRR, no murmurs, rubs or gallops Lungs: CTA bilaterally without wheezing, rhonchi or rales Abdomen: Soft, non-tender, non-distended, +BS Extremities: No edema bilateral lower extremities Dialysis Access: PD cath in abdomen, no erythema, drainage or tenderness   Additional Objective Labs: Basic Metabolic Panel: Recent Labs  Lab 08/13/20 0739 08/13/20 1547 08/14/20 0528 08/15/20 0504 08/18/20 0612  NA 129*  --  127* 127* 124*  K 2.6*   < > 3.6 3.0* 3.6  CL 92*  --  92* 92* 91*  CO2 27  --  28 26 26   GLUCOSE 117*  --  119* 91 86  BUN 24*  --  23 24* 23  CREATININE 2.68*  --  2.83* 2.80* 2.84*  CALCIUM 8.4*  8.2*  --  8.2* 8.1* 8.3*  PHOS 3.6  --  3.3 3.6  --    < > = values in this interval not displayed.   Liver Function Tests: Recent Labs  Lab 08/13/20 0739 08/14/20 0528 08/15/20 0504  ALBUMIN 1.2* 1.1* 1.0*   No results for input(s): LIPASE, AMYLASE in the last 168 hours. CBC: Recent Labs  Lab 08/13/20 0739 08/18/20 0612  WBC 8.2 7.7  HGB 10.8* 10.0*  HCT 33.4* 30.2*  MCV 98.8 98.7  PLT 348 353   Blood Culture    Component Value Date/Time   SDES URINE, CLEAN CATCH 08/15/2020 1209   SPECREQUEST  08/15/2020 1209    NONE Performed at [facility], [place] [address] [place], [place] [address]    CULT MULTIPLE SPECIES PRESENT, SUGGEST RECOLLECTION (A) 08/15/2020 1209   REPTSTATUS 08/16/2020 FINAL 08/15/2020 1209    Medications: . dialysis solution 1.5% low-MG/low-CA     . aspirin EC  81 mg Oral Daily  . atorvastatin  40 mg Oral Daily  . brivaracetam  75 mg Oral BID  . butalbital-acetaminophen-caffeine  1 tablet Oral TID with meals  . citalopram  10 mg Oral Daily  . darbepoetin (ARANESP) injection - NON-DIALYSIS  60 mcg Subcutaneous Q Tue-1800  . docusate sodium  100 mg Oral BID  . gentamicin cream  1 application Topical Daily  . heparin  5,000 Units Subcutaneous Q12H  . kidney failure book   Does not apply Once  . lacosamide  75 mg Oral BID  . melatonin  5 mg Oral QHS  . metoCLOPramide  5 mg Oral TID AC  . metoprolol tartrate  6.25 mg Oral BID  . multivitamin  1 tablet Oral QHS  . pantoprazole  40 mg Oral Daily  . sodium chloride  1 g Oral BID WC  . triamcinolone cream   Topical TID    Dialysis Orders:  [place] followed by Dr. Candiss Norse ([place]). 4 exchanges overnight, 2000 cc and 1.5 hrs each.   Assessment/Plan:  1.Concern for UTI: UA looks good, she is asymptomatic,  culture inconclusive. Not on antibiotics at present, low suspicion for UTI.  2. Debility: 2/2 Multiple med issues, most recently SDH recovery and nstemi w/ CAD and low EF. Readmitted to CIR on 08/04/20. 3.Fall w/ subdural hematoma- sp SDH evacuation at [facility] on3/24/22 4. Nausea/ vomiting- etiology unclear, uremiaunlikely.PD cell count in the past and culture unrevealing, Preliminary report of body fluid culture shows ngtd and effluent has been clear. Symptoms improved.  5. Malnutrition/ debility- very low albumin, weak/tired. It has been d/w husband on several occassions in the past about a possible trial of HD due to low albumin in a PD patient. He had stated in the past they are not interested and feels she would not tolerate HD. 6. Vol overload-resolved- sig LE dependent edema and UE edemaon admission. Volume status overall improved since admission and patient is euvolemic on exam. Continues  to have good UF with all 1.5% exchanges, will continue.  7. [place]PD[place], [place], f/b Dr Candiss Norse of [place].Cont PD nightly4x 2000cc. Blood effluent seen on takedown 4/15 but has not had any more since.PD gram stain, cultures and cell count not consistent with infection.  8.Multivessel CAD- with reduced EF 25-30% by recent cath in Feb 2022. Per TCTSwasnotCABG candidate. UnderwentPCI to LAD. At [facility] repeat echo per family showed improved EF 55%. 9. Chronichyponatremia-worsening, Na at 124 yesterday. Issues with N/V and poor PO intake. Suspect this is a PO intake issue more so than hypervolemia. Discussed with Dr. Jonnie Finner and will start PO NaCl BID, follow sodium level daily. BMP pending.   10. Metabolic acidosis-CO2 26, not on sodium bicarbonate supplementation at present.  11. HTN-BP stable. Continue lopressor 6.25mg  BID 12. Anemiaof ESRD-Hemoglobin 10.0. Continue aranesp.  79. H/o Seizures-per primary team 15.H/oC diff-rx'd in Feb 2022  Anice Paganini, Hershal Coria 08/19/2020, 10:23 AM  [facility] Pager: [phone]

## 2020-08-19 NOTE — Plan of Care (Signed)
  Problem: Consults Goal: RH BRAIN INJURY PATIENT EDUCATION Description: Description: See Patient Education module for eduction specifics Outcome: Progressing   Problem: RH SKIN INTEGRITY Goal: RH STG SKIN FREE OF INFECTION/BREAKDOWN Description: With min assist  Outcome: Progressing Goal: RH STG MAINTAIN SKIN INTEGRITY WITH ASSISTANCE Description: STG Maintain Skin Integrity With min  Assistance. Outcome: Progressing   Problem: RH COGNITION-NURSING Goal: RH STG USES MEMORY AIDS/STRATEGIES W/ASSIST TO PROBLEM SOLVE Description: STG Uses Memory Aids/Strategies With cues/reminders Assistance to Problem Solve. Outcome: Progressing   Problem: RH PAIN MANAGEMENT Goal: RH STG PAIN MANAGED AT OR BELOW PT'S PAIN GOAL Description: At or below level 4 Outcome: Progressing

## 2020-08-19 NOTE — Progress Notes (Signed)
[place] PHYSICAL MEDICINE & REHABILITATION PROGRESS NOTE  Subjective/Complaints: Patient seen lying in bed this morning.  She states she slept well overnight.  She states she is doing well.  She notes improvement in nausea.  She was seen by nephrology yesterday, note reviewed- no changes.  ROS: Denies CP, SOB, nausea, vomiting, diarrhea.  Objective: Vital Signs: Blood pressure 129/70, pulse 75, temperature 98.2 F (36.8 C), resp. rate 15, height 5\' 4"  (1.626 m), weight 50.4 kg, SpO2 99 %. No results found. Recent Labs    08/18/20 0612  WBC 7.7  HGB 10.0*  HCT 30.2*  PLT 353   Recent Labs    08/18/20 0612  NA 124*  K 3.6  CL 91*  CO2 26  GLUCOSE 86  BUN 23  CREATININE 2.84*  CALCIUM 8.3*    Intake/Output Summary (Last 24 hours) at 08/19/2020 0844 Last data filed at 08/18/2020 2000 Gross per 24 hour  Intake 240 ml  Output --  Net 240 ml        Physical Exam: BP 129/70   Pulse 75   Temp 98.2 F (36.8 C)   Resp 15   Ht 5\' 4"  (1.626 m)   Wt 50.4 kg   SpO2 99%   BMI 19.07 kg/m  Constitutional: No distress . Vital signs reviewed. HENT: Normocephalic.  Atraumatic. Eyes: EOMI. No discharge. Cardiovascular: No JVD.  RRR. Respiratory: Normal effort.  No stridor.  Bilateral clear to auscultation. GI: Non-distended.  BS +. Skin: Warm and dry.  Intact.  Stage I right sacral ulcer. Psych: Normal mood.  Normal behavior. Musc: No edema in extremities.  No tenderness in extremities. Neuro: Alert Motor: Motor: LUE: 4-4+/5 proximal distal, unchanged RUE: 4/5 proximal distal LLE: Hip flexion 3/5, knee extension 3/5, ankle dorsiflexion 3/5 RLE: Hip flexion 2+/5, knee extension 2+/5, ankle dorsiflexion 3/5   Assessment/Plan: 1. Functional deficits which require 3+ hours per day of interdisciplinary therapy in a comprehensive inpatient rehab setting.  Physiatrist is providing close team supervision and 24 hour management of active medical problems listed  below.  Physiatrist and rehab team continue to assess barriers to discharge/monitor patient progress toward functional and medical goals   Care Tool:  Bathing    Body parts bathed by patient: Right arm,Left arm,Chest,Abdomen,Front perineal area,Face,Right upper leg,Left upper leg,Buttocks,Right lower leg,Left lower leg   Body parts bathed by helper: Buttocks     Bathing assist Assist Level: Minimal Assistance - Patient > 75%     Upper Body Dressing/Undressing Upper body dressing   What is the patient wearing?: Pull over shirt    Upper body assist Assist Level: Minimal Assistance - Patient > 75%    Lower Body Dressing/Undressing Lower body dressing      What is the patient wearing?: Pants     Lower body assist Assist for lower body dressing: Moderate Assistance - Patient 50 - 74%     Toileting Toileting    Toileting assist Assist for toileting: 2 Helpers     Transfers Chair/bed transfer  Transfers assist     Chair/bed transfer assist level: Moderate Assistance - Patient 50 - 74% Chair/bed transfer assistive device: Armrests   Locomotion Ambulation   Ambulation assist   Ambulation activity did not occur: Safety/medical concerns  Assist level: 2 helpers Assistive device: Walker-rolling Max distance: 8'   Walk 10 feet activity   Assist  Walk 10 feet activity did not occur: Safety/medical concerns        Walk 50 feet activity  Assist Walk 50 feet with 2 turns activity did not occur: Safety/medical concerns         Walk 150 feet activity   Assist Walk 150 feet activity did not occur: Safety/medical concerns         Walk 10 feet on uneven surface  activity   Assist Walk 10 feet on uneven surfaces activity did not occur: Safety/medical concerns         Wheelchair     Assist Will patient use wheelchair at discharge?: Yes Type of Wheelchair: Manual    Wheelchair assist level: Supervision/Verbal cueing Max wheelchair  distance: 31'    Wheelchair 50 feet with 2 turns activity    Assist    Wheelchair 50 feet with 2 turns activity did not occur: Safety/medical concerns   Assist Level: Supervision/Verbal cueing   Wheelchair 150 feet activity     Assist  Wheelchair 150 feet activity did not occur: Safety/medical concerns        Medical Problem List and Plan: 1.  Right hemiparesis and aphasia secondary to large left SDH s/p craniotomy  Continue CIR  Plan to DC staples 2.  Impaired mobility -DVT/anticoagulation:  Mechanical: Continue Sequential compression devices, below knee Bilateral lower extremities             -antiplatelet therapy: N/A due to bleed.  3. Chronic migraines/ Pain Management: Continue Fioricet 3-4 times a day on average. Added prn Fioricet q8H  Controlled with meds on 4/19  Monitor with increased exertion 4. Mood: LCSW to follow for evaluation and support.  -antipsychotic agents: N/A 5. Neuropsych: This patient is not fully capable of making decisions on her own behalf. 6. Skin/Wound Care: Monitor wound for healing.             -Family to provide meals from home as able.  -bilateral PRAFOs ordered but patient refuses to wear 7. Fluids/Electrolytes/Nutrition: Continue PD--does not need fluid restriction.              --has had chronic issues with variable intake as well as N/V likely due to gastroparesis.  8. Chronic Hypotension: ContinueTEDs when out of bed             Vitals:   08/19/20 0331 08/19/20 0756  BP: 121/71 129/70  Pulse: 73 75  Resp: 15   Temp: 98.2 F (36.8 C)   SpO2: 99%    Controlled on 4/19 9.  ESRD on PD: Nephrology to assist/manage PD.  10.  Recurrent Seizures: Has been seizure free, continue Brivaracetam and Lacosamide bid.              --Lacosamide was decreased on 03/31 to avoid sedative SE.  No breakthrough seizures since admission- 4/19 11. CAD/NSTEMI s/p PCI: Treated medically.   Off DAPT due to bleed             --Continue Lipitor.    Metoprolol low-dose resumed  Heart rate controlled on 4/19 12. Acute on chronic anemia:   Hemoglobin 10.0 on 4/18  Continue to monitor 13. H/o Depression: Continue Celexa 14. Sick Euthyroid: Will need follow up as illness resolves.              --TSH- 5.80/Free T4-1.2 (03/27) 15. Gastroparesis w/chronic N/V/anorexia: Has been ongoing per records review.              --question failure of PD (husband reports no N/V with brief episode of HD in the past)  Continue Reglan 5 3 times daily  Hesitant  to continue increase medications due to #18  Improving 16.  Hyponatremia             --chronic hyponatremia with Na-130-133 range  Sodium 130 on 4/5, 127 on 4/14             --Continue Sodium bicarb to manage metabolic acidosis. 17.  Severe hypoalbuminemia  Supplement d/ced given nausea and vomiting- recommended nuts as source of protein. Her daughter has brought in.  18.  Prolonged QTC  ECG reviewed, improved since last visit  Compazine and dose/frequency decreased 19.  Sleep disturbance- "never a good sleeper" , on PD a night and gets disturbed by hospital sounds, cont trazodone and melatonin    LOS: 15 days A FACE TO FACE EVALUATION WAS PERFORMED  Liliana Brentlinger Lorie Phenix 08/19/2020, 8:44 AM

## 2020-08-19 NOTE — Progress Notes (Signed)
Speech Language Pathology TBI Note  Patient Details  Name: Amy [name] MRN: [id number] Date of Birth: [birth year]  Today's Date: 08/19/2020 SLP Individual Time: 1345-1430 SLP Individual Time Calculation (min): 45 min  Short Term Goals: Week 3: SLP Short Term Goal 1 (Week 3): Patient will utilize external memory aids to recall new, daily information with supervision A verbal cues. SLP Short Term Goal 2 (Week 3): Pt will demonstrate sustained attention in functional tasks in 30 minue intervals with supervision A verbal cues for redirection. SLP Short Term Goal 3 (Week 3): Pt will demonstrate mildly complex problem solving skills in functional tasks with min A verbal cues. SLP Short Term Goal 4 (Week 3): Pt will demonstrate self-monitor and self-correction in functional errors with min A verbal cues.  Skilled Therapeutic Interventions:Skilled ST services focused on cognitive skills. Pt was unable to recall AM OT session and required max A visual cue (presentation of calendar) to recall yesterday's ST session. SLP recorded OT events in notebook and posted sign outside of room to remind therapist to assist to in recording in memory notebook. SLP facilitated mildly complex problem solving, error awareness and recall within task skills in familiar calendar task (completing from yesterday's session.) Pt demonstrated mod I strategy to aid in focusing attention, covering up already completed appointments. Pt required max A fade to mod A verbal cues for problem solving, error awareness and use of recall strategies within task. Pt was unable to recall calendar task in 1-5 minute intervals after completion, requiring visual aid (presentation of the calendar) each time to recall. Pt requested to use bedpan, SLP assisted, pt urinated very minimally. Pt was left in room with call bell within reach and bed alarm set. SLP recommends to continue skilled services.     Pain Pain Assessment Pain Score: 0-No  pain  Agitated Behavior Scale: TBI   Agitated Behavior Scale (DO NOT LEAVE BLANKS) Short attention span, easy distractibility, inability to concentrate: Present to a moderate degree Impulsive, impatient, low tolerance for pain or frustration: Absent Uncooperative, resistant to care, demanding: Absent Violent and/or threatening violence toward people or property: Absent Explosive and/or unpredictable anger: Absent Rocking, rubbing, moaning, or other self-stimulating behavior: Absent Pulling at tubes, restraints, etc.: Absent Wandering from treatment areas: Absent Restlessness, pacing, excessive movement: Absent Repetitive behaviors, motor, and/or verbal: Absent Rapid, loud, or excessive talking: Absent Sudden changes of mood: Absent Easily initiated or excessive crying and/or laughter: Absent Self-abusiveness, physical and/or verbal: Absent Agitated behavior scale total score: 16  Therapy/Group: Individual Therapy  Okie Jansson  [facility] 08/19/2020, 2:41 PM

## 2020-08-19 NOTE — Progress Notes (Signed)
Occupational Therapy Weekly Progress Note  Patient Details  Name: TANISHIA LEMASTER MRN: [id number] Date of Birth: [birth year]  Beginning of progress report period: August 12, 2020 End of progress report period: August 19, 2020     Patient has met 3 of 3 short term goals.  Patient with improving tolerance for therapy, less N/V and overall improved ability to perform adl and mobility tasks.    Patient continues to demonstrate the following deficits: muscle weakness, decreased cardiorespiratoy endurance and decreased sitting balance, decreased standing balance and decreased postural control and therefore will continue to benefit from skilled OT intervention to enhance overall performance with BADL and Reduce care partner burden.  Patient progressing toward long term goals..  Continue plan of care.  OT Short Term Goals Week 2:  OT Short Term Goal 1 (Week 2): patient will complete UB bathing/dressing tasks edge of bed with min A/CS OT Short Term Goal 1 - Progress (Week 2): Met OT Short Term Goal 2 (Week 2): patient will complete functional transfers mod A OT Short Term Goal 2 - Progress (Week 2): Met OT Short Term Goal 3 (Week 2): patient will tolerate sitting OOB for 1 hour daily OT Short Term Goal 3 - Progress (Week 2): Met Week 3:  OT Short Term Goal 1 (Week 3): patient will complete toileting with mod A OT Short Term Goal 2 (Week 3): patient will complete bed mobility and SPT with min A OT Short Term Goal 3 (Week 3): patient will complete LB bathing/dressing with min a   Carlos Levering 08/19/2020, 12:58 PM

## 2020-08-19 NOTE — Progress Notes (Signed)
Occupational Therapy Session Note  Patient Details  Name: Amy [name] MRN: [id number] Date of Birth: 19-Feb-1951  Today's Date: 08/19/2020 OT Individual Time: 1100-1200 OT Individual Time Calculation (min): 60 min     Short Term Goals: Week 2:  OT Short Term Goal 1 (Week 2): patient will complete bathing/dressing tasks edge of bed with min A/CS OT Short Term Goal 2 (Week 2): patient will complete functional transfers mod A OT Short Term Goal 3 (Week 2): patient will tolerate sitting OOB for 1 hour daily  Skilled Therapeutic Interventions/Progress Updates:    Patient in bed, she denies pain or nausea at this time.  On bed pan with nursing - assisted off bed pan, continent of bowel.  Dependent for hygiene, max/dep to place clean brief, donn ace wraps and non-skid slipper socks.  Rolling in bed with CS.  Side lying to sitting edge of bed mod A.  Sit to stand and SPT bed to w/c mod A.   Donned pants w/c level mod A, clothing management in stance max A.   Completed oral care w/c level mod I, cues for washing upperbody, OH shirt with CS.  SPT w/c to recliner with mod A.  Completed unsupported sitting arm reaching activity with mild fatigue.  She remained seated in recliner at close of session with legs elevated, seat belt alarm on and callbell/tray table in reach.    Therapy Documentation Precautions:  Precautions Precautions: Fall Precaution Comments: seizure precautions Restrictions Weight Bearing Restrictions: No Other Position/Activity Restrictions: head incision   Therapy/Group: Individual Therapy  Carlos Levering 08/19/2020, 7:39 AM

## 2020-08-19 NOTE — Progress Notes (Signed)
Physical Therapy Weekly Progress Note  Patient Details  Name: Amy [name] MRN: [id number] Date of Birth: [birth year]  Beginning of progress report period: August 12, 2020 End of progress report period: August 19, 2020  Today's Date: 08/19/2020 PT Individual Time: 1500-1600 PT Individual Time Calculation (min): 60 min   Patient has met 2 of 4 short term goals.  Patient progressing towards all goals this week.  Currently mod to max A for bed/chair transfers and to RW needs +2 A for sit to stand and to ambulate 8'.  Today able to stand at sink to brush teeth standing for 1.5 minutes with flexed posture and mod A for balance.  Patient also able to propel w/c 41' today with S in open environment.  She has been participating, but just very slow progress due to limited upright tolerance, decreased balance, decreased strength, decreased cognition as noted below.   Patient continues to demonstrate the following deficits muscle weakness and muscle paralysis and decreased initiation, decreased problem solving and decreased memory and therefore will continue to benefit from skilled PT intervention to increase functional independence with mobility.  Patient progressing toward long term goals..  Continue plan of care.  PT Short Term Goals Week 2:  PT Short Term Goal 1 (Week 2): Patient will perform bed<>chair transfers with mod A. PT Short Term Goal 1 - Progress (Week 2): Progressing toward goal PT Short Term Goal 2 (Week 2): Patient will ambulate 7' in parallel bars with mod A PT Short Term Goal 2 - Progress (Week 2): Progressing toward goal PT Short Term Goal 3 (Week 2): Patient will propel w/c 17' with S. PT Short Term Goal 3 - Progress (Week 2): Met PT Short Term Goal 4 (Week 2): Patient will tolerate standing 1 minute for functional task. PT Short Term Goal 4 - Progress (Week 2): Met Week 3:  PT Short Term Goal 1 (Week 3): Patient will perform sit to stand transfers with mod A consistently. PT Short  Term Goal 2 (Week 3): Patient will perfom bed/chair transfers with mod A consistently. PT Short Term Goal 3 (Week 3): Patient will ambulate 92' with RW and mod A with w/c follow. PT Short Term Goal 4 (Week 3): standing for at least 1 minute for functional ADL with mod A.  Skilled Therapeutic Interventions/Progress Updates:  Patient in supine wearing brief and shirt. Wearing ace wraps on legs throughout session and spot check BP WNL.  Assist to don pants pt lifting legs and PT looping legs in PJ pants with mod to max A overall, pt able to pull pants up in front, but assist for pulling up in back.  Patient performed therex in supine including hooklying hip flexion, hip abduction with orange t-band, bridging and lateral trunk rotation most 2 x 10 reps.  Patient c/o feeling soiled so assisted to doff pants and brief and noted incontinent of stool.  Assisted for some hygiene then placed on bed pan.  Patient rolled for hygiene with total a and for donning new brief and pants.  Patient supine to sit with mod A.  Performed sit to stand to RW and mod A +2.  Ambulated x 8' and completed turn but chair brought up to her to sit.  Sit to stand at sink to brush teeth with +2 A, standing x 1.5 minutes with mod A for balance.  Patient propelled w/c x 39' with S and cues.  Sit to stand and ambulated 4' to bed with +2 A.  Sit to supine with mod A.  Positioned up in chair position and left for RN to start PD.   Ambulation/gait training;Balance/vestibular training;Cognitive remediation/compensation;Functional mobility training;Patient/family education;Therapeutic Exercise;UE/LE Coordination activities;Splinting/orthotics;Therapeutic Activities;Skin care/wound management;Pain management;Discharge planning;DME/adaptive equipment instruction;Neuromuscular re-education;Stair training;UE/LE Strength taining/ROM;Wheelchair propulsion/positioning   Therapy Documentation Precautions:  Precautions Precautions: Fall Precaution  Comments: seizure precautions Restrictions Weight Bearing Restrictions: No Other Position/Activity Restrictions: head incision Pain: Pain Assessment Pain Score: 0-No pain Faces Pain Scale: No hurt  Therapy/Group: Individual Therapy  Reginia Naas  Magda Kiel, PT 08/19/2020, 3:25 PM

## 2020-08-20 DIAGNOSIS — S065X9A Traumatic subdural hemorrhage with loss of consciousness of unspecified duration, initial encounter: Secondary | ICD-10-CM | POA: Diagnosis not present

## 2020-08-20 DIAGNOSIS — G8918 Other acute postprocedural pain: Secondary | ICD-10-CM | POA: Diagnosis not present

## 2020-08-20 DIAGNOSIS — G479 Sleep disorder, unspecified: Secondary | ICD-10-CM | POA: Diagnosis not present

## 2020-08-20 DIAGNOSIS — D649 Anemia, unspecified: Secondary | ICD-10-CM | POA: Diagnosis not present

## 2020-08-20 LAB — BASIC METABOLIC PANEL
Anion gap: 9 (ref 5–15)
BUN: 23 mg/dL (ref 8–23)
CO2: 27 mmol/L (ref 22–32)
Calcium: 8.6 mg/dL — ABNORMAL LOW (ref 8.9–10.3)
Chloride: 93 mmol/L — ABNORMAL LOW (ref 98–111)
Creatinine, Ser: 3.05 mg/dL — ABNORMAL HIGH (ref 0.44–1.00)
GFR, Estimated: 16 mL/min — ABNORMAL LOW (ref >60)
Glucose, Bld: 88 mg/dL (ref 70–99)
Potassium: 4 mmol/L (ref 3.5–5.1)
Sodium: 129 mmol/L — ABNORMAL LOW (ref 135–145)

## 2020-08-20 MED ORDER — GENTAMICIN SULFATE 0.1 % EX CREA
1.0000 "application " | TOPICAL_CREAM | Freq: Every day | CUTANEOUS | Status: DC
  Administered 2020-08-20: 1 via TOPICAL
  Filled 2020-08-20: qty 15

## 2020-08-20 MED ORDER — DELFLEX-LC/1.5% DEXTROSE 344 MOSM/L IP SOLN: INTRAPERITONEAL | Status: DC

## 2020-08-20 NOTE — Progress Notes (Signed)
Amy [name] PHYSICAL MEDICINE & REHABILITATION PROGRESS NOTE  Subjective/Complaints: Patient seen laying in bed this AM.  She states she slept well overnight.  She denies complaints.  Discussed sutures with nursing.    ROS: Denies CP, SOB, nausea, vomiting, diarrhea.  Objective: Vital Signs: Blood pressure 114/61, pulse 85, temperature 98.1 F (36.7 C), temperature source Oral, resp. rate 16, height 5\' 4"  (1.626 m), weight 46.8 kg, SpO2 100 %. No results found. Recent Labs    08/18/20 0612  WBC 7.7  HGB 10.0*  HCT 30.2*  PLT 353   Recent Labs    08/19/20 1027 08/20/20 0538  NA 125* 129*  K 3.9 4.0  CL 92* 93*  CO2 27 27  GLUCOSE 85 88  BUN 24* 23  CREATININE 2.93* 3.05*  CALCIUM 8.1* 8.6*    Intake/Output Summary (Last 24 hours) at 08/20/2020 0926 Last data filed at 08/20/2020 0753 Gross per 24 hour  Intake 590 ml  Output --  Net 590 ml        Physical Exam: BP 114/61 (BP Location: Left Arm)   Pulse 85   Temp 98.1 F (36.7 C) (Oral)   Resp 16   Ht 5\' 4"  (1.626 m)   Wt 46.8 kg   SpO2 100%   BMI 17.71 kg/m  Constitutional: No distress . Vital signs reviewed. HENT: Normocephalic.  Atraumatic. Eyes: EOMI. No discharge. Cardiovascular: No JVD.  RRR. Respiratory: Normal effort.  No stridor.  Bilateral clear to auscultation. GI: Non-distended.  BS +. Skin: Warm and dry.  Intact. Psych: Normal mood.  Normal behavior. Musc: No edema in extremities.  No tenderness in extremities. Neuro: Alert Motor: Motor: LUE: 4-4+/5 proximal distal, unchanged RUE: 4/5 proximal distal LLE: Hip flexion 4-/5, knee extension 4-/5, ankle dorsiflexion 4-/5 RLE: Hip flexion 3+/5, knee extension 3+/5, ankle dorsiflexion 3-3+/5   Assessment/Plan: 1. Functional deficits which require 3+ hours per day of interdisciplinary therapy in a comprehensive inpatient rehab setting.  Physiatrist is providing close team supervision and 24 hour management of active medical problems listed  below.  Physiatrist and rehab team continue to assess barriers to discharge/monitor patient progress toward functional and medical goals   Care Tool:  Bathing    Body parts bathed by patient: Right arm,Left arm,Chest,Abdomen,Front perineal area,Face,Right upper leg,Left upper leg,Buttocks,Right lower leg,Left lower leg   Body parts bathed by helper: Buttocks     Bathing assist Assist Level: Minimal Assistance - Patient > 75%     Upper Body Dressing/Undressing Upper body dressing   What is the patient wearing?: Pull over shirt    Upper body assist Assist Level: Supervision/Verbal cueing    Lower Body Dressing/Undressing Lower body dressing      What is the patient wearing?: Pants     Lower body assist Assist for lower body dressing: Moderate Assistance - Patient 50 - 74%     Toileting Toileting    Toileting assist Assist for toileting: Maximal Assistance - Patient 25 - 49%     Transfers Chair/bed transfer  Transfers assist     Chair/bed transfer assist level: Moderate Assistance - Patient 50 - 74% Chair/bed transfer assistive device: Armrests   Locomotion Ambulation   Ambulation assist   Ambulation activity did not occur: Safety/medical concerns  Assist level: 2 helpers Assistive device: Walker-rolling Max distance: 8'   Walk 10 feet activity   Assist  Walk 10 feet activity did not occur: Safety/medical concerns        Walk 50 feet activity  Assist Walk 50 feet with 2 turns activity did not occur: Safety/medical concerns         Walk 150 feet activity   Assist Walk 150 feet activity did not occur: Safety/medical concerns         Walk 10 feet on uneven surface  activity   Assist Walk 10 feet on uneven surfaces activity did not occur: Safety/medical concerns         Wheelchair     Assist Will patient use wheelchair at discharge?: Yes Type of Wheelchair: Manual    Wheelchair assist level: Supervision/Verbal  cueing Max wheelchair distance: 50'    Wheelchair 50 feet with 2 turns activity    Assist    Wheelchair 50 feet with 2 turns activity did not occur: Safety/medical concerns   Assist Level: Supervision/Verbal cueing   Wheelchair 150 feet activity     Assist  Wheelchair 150 feet activity did not occur: Safety/medical concerns        Medical Problem List and Plan: 1.  Right hemiparesis and aphasia secondary to large left SDH s/p craniotomy  Continue CIR  Team conference today to discuss current and goals and coordination of care, home and environmental barriers, and discharge planning with nursing, case manager, and therapies. Please see conference note from today as well.  2.  Impaired mobility -DVT/anticoagulation:  Mechanical: Continue Sequential compression devices, below knee Bilateral lower extremities             -antiplatelet therapy: N/A due to bleed.  3. Chronic migraines/ Pain Management: Continue Fioricet 3-4 times a day on average. Added prn Fioricet q8H  Controlled with meds on 4/20  Monitor with increased exertion 4. Mood: LCSW to follow for evaluation and support.  -antipsychotic agents: N/A 5. Neuropsych: This patient is not fully capable of making decisions on her own behalf. 6. Skin/Wound Care: Monitor wound for healing.             -Family to provide meals from home as able.  -bilateral PRAFOs ordered but patient refuses to wear 7. Fluids/Electrolytes/Nutrition: Continue PD--does not need fluid restriction.              --has had chronic issues with variable intake as well as N/V likely due to gastroparesis.  8. Chronic Hypotension: ContinueTEDs when out of bed             Vitals:   08/20/20 0502 08/20/20 0705  BP: 100/60 114/61  Pulse: 84 85  Resp: 17 16  Temp: 98.3 F (36.8 C) 98.1 F (36.7 C)  SpO2: 100% 100%   Controlled on 4/20 9.  ESRD on PD: Nephrology to assist/manage PD.  10.  Recurrent Seizures: Has been seizure free, continue  Brivaracetam and Lacosamide bid.              --Lacosamide was decreased on 03/31 to avoid sedative SE.  No breakthrough seizures since admission- 4/20 11. CAD/NSTEMI s/p PCI: Treated medically.   Off DAPT due to bleed   Continue Lipitor.   Metoprolol low-dose resumed  Heart rate controlled on 4/19 12. Acute on chronic anemia:   Hemoglobin 10.0 on 4/18  Continue to monitor 13. H/o Depression: Continue Celexa 14. Sick Euthyroid: Will need follow up as illness resolves.              --TSH- 5.80/Free T4-1.2 (03/27) 15. Gastroparesis w/chronic N/V/anorexia: Has been ongoing per records review.              --question failure  of PD (husband reports no N/V with brief episode of HD in the past)  Continue Reglan 5 3 times daily  Hesitant to continue increase medications due to #18  Improving 16.  Hyponatremia             --chronic hyponatremia with Na-130-133 range  Sodium 129 on 4/20             --Continue Sodium bicarb to manage metabolic acidosis. 17.  Severe hypoalbuminemia  Supplement d/ced given nausea and vomiting- recommended nuts as source of protein. Her daughter has brought in.  18.  Prolonged QTC  ECG reviewed, improved since last visit  Compazine and dose/frequency decreased 19.  Sleep disturbance- "never a good sleeper" , on PD a night and gets disturbed by hospital sounds, cont trazodone and melatonin    Relatively stable  LOS: 16 days A FACE TO FACE EVALUATION WAS PERFORMED  Amy [name] Amy [name] 08/20/2020, 9:26 AM

## 2020-08-20 NOTE — Progress Notes (Signed)
Sutures (3) removed from left posterior scalp per MD order.

## 2020-08-20 NOTE — Progress Notes (Signed)
Nutrition Follow-up  RD working remotely.  DOCUMENTATION CODES:   Underweight,Severe malnutrition in context of chronic illness  INTERVENTION:   - Continue to recommend Cortrak NG tube placement and initiation of enteral nutrition if within pt's GOC  -ContinueMagicCup TID with meals, each supplement provides 290 kcal and 9 grams of protein  - Continue chocolate milk TID with mealsper pt request  -Continue vanilla ice cream TID with meals (to mix with orange cream Magic Cups)  - Encourage adequate PO   NUTRITION DIAGNOSIS:   Severe Malnutrition related to chronic illness (ESRD) as evidenced by severe fat depletion,severe muscle depletion.  Ongoing  GOAL:   Patient will meet greater than or equal to 90% of their needs  Unmet  MONITOR:   PO intake,Supplement acceptance,Labs,Weight trends,I & O's  REASON FOR ASSESSMENT:   Other (history of malnutrition)    ASSESSMENT:   70 year old female with PMH of ESRD on PD, chronic hyponatremia, C diff colitis, seizures, NSTEMI s/p PCI w/ DAPT, chronic issues with N/V resulting in debility and CIR stay 3/04-3/24/22. Pt was discharged to home and experienced a mild fall that afternoon followed by obtundation that evening. Pt was evaluated at [facility] 07/24/20 and found to have acute on chronic left SDH. Pt was transferred to [facility] and underwent left partial craniotomy for evacuation of SDH with placement of subdural drain which was removed on 3/28. Cortrak placed at [facility] for nutrition support. Admitted to CIR on 4/04.  Per notes, pt has been consuming nuts that her daughter brought in and nausea has resolved. Pt continues on PD overnight.  Pt continues to have poor PO intake at mealtimes, averaging 40% over the last 8 documented meals. Weight continues to trend down. Pt with an 8.7 kg weight loss (15.7%) since admission to CIR on 4/04. This is severe and significant for timeframe. Continue to recommend Cortrak placement and  initiation of enteral nutrition if within pt's [place]. Per review of notes, this has not been discussed with pt or family despite recommendations.  Pt with ongoing edema to BUE and BLE. Pt's true dry weight is likely significant lower than current weight.  CIR admission weight: 55.5 kg Current weight: 46.8 kg  Meal Completion: 0-100% x last 8 documented meals (averaging 40%)  Medications reviewed and include: aranesp weekly, colace, melatonin, reglan, rena-vit, protonix, sodium chloride 1 gram BID  Labs reviewed: sodium 129, hemoglobin 10.0  Diet Order:   Diet Order            Diet regular Room service appropriate? Yes; Fluid consistency: Thin; Fluid restriction: Other (see comments)  Diet effective now                 EDUCATION NEEDS:   Education needs have been addressed  Skin:  Skin Assessment: Skin Integrity Issues: Incisions: head  Last BM:  08/20/20 multiple type 6  Height:   Ht Readings from Last 1 Encounters:  08/04/20 5\' 4"  (1.626 m)    Weight:   Wt Readings from Last 1 Encounters:  08/20/20 46.8 kg    BMI:  Body mass index is 17.71 kg/m.  Estimated Nutritional Needs:   Kcal:  1700-1900  Protein:  85-100 grams  Fluid:  1000 ml + UOP    Gustavus Bryant, MS, RD, LDN Inpatient Clinical Dietitian Please see AMiON for contact information.

## 2020-08-20 NOTE — Progress Notes (Signed)
Physical Therapy Session Note  Patient Details  Name: Amy [name] MRN: [id number] Date of Birth: 1950-11-01  Today's Date: 08/20/2020 PT Individual Time: 1100-1130 PT Individual Time Calculation (min): 30 min   Short Term Goals: Week 3:  PT Short Term Goal 1 (Week 3): Patient will perform sit to stand transfers with mod A consistently. PT Short Term Goal 2 (Week 3): Patient will perfom bed/chair transfers with mod A consistently. PT Short Term Goal 3 (Week 3): Patient will ambulate 40' with RW and mod A with w/c follow. PT Short Term Goal 4 (Week 3): standing for at least 1 minute for functional ADL with mod A.  Skilled Therapeutic Interventions/Progress Updates:    Session1:  Patient in supine and time to recall previous session events.  Performed supine to sit with mod A.  Seated EOB to scoot to EOB mod cues and mod A for sequencing.  Patient stand pivot to w/c mod A +2 for safety.  Pushed in w/c to therapy gym.  Performed sit to stand to RW with +2 A/max A.  Ambulated with RW and +2 A x 10' assist for safety with support to R LE in stance and cues for upright posture and forward gaze. Patient reports feels as if incontinent.  Assisted sit to stand and stand pivot with RW max A to w/c.  Patient pushed in w/c to room.  Stand pivot using bed rail to bed mod A.  Sit to supine mod A.  Patient mod A to doff pants in supine.  Patient rolled to L with S using rail.  Assisted for hygiene and pt requesting to be placed on bed pan.  Patient left on bed pan with call bell in reach and NT aware, bed alarm active.   [place]: Patient in supine and without complaint.  Performed supine LE therex including ankle pumps, heel slides, hip abduction, SAQ w/ 3 sec hold, bridging w/3 sec hold, lateral trunk rotation all x 10.  Patient requesting to toilet.  Supine to sit mod A.  Sit to stand and step with RW to [facility] with +2 A for doffing pants and balance.  Sit to stand to RW max A and +2 A for hygiene , to don brief  and pull up pants.  Patient attempted to step around to recliner with RW, but needed +2 max to total A due to imbalance and LE fatigue/weakness.  Assisted to scoot back in recliner and left with call bell in reach in anticipation of OT session upcoming.    Therapy Documentation Precautions:  Precautions Precautions: Fall Precaution Comments: seizure precautions Restrictions Weight Bearing Restrictions: No Other Position/Activity Restrictions: head incision  Pain Assessment Pain Scale: 0-10 Pain Score: 0-No pain    Therapy/Group: Individual Therapy  Reginia Naas  Magda Kiel, PT 08/20/2020, 11:56 AM

## 2020-08-20 NOTE — Plan of Care (Signed)
  Problem: Consults Goal: RH BRAIN INJURY PATIENT EDUCATION Description: Description: See Patient Education module for eduction specifics Outcome: Progressing   Problem: RH SKIN INTEGRITY Goal: RH STG SKIN FREE OF INFECTION/BREAKDOWN Description: With min assist  Outcome: Progressing Goal: RH STG MAINTAIN SKIN INTEGRITY WITH ASSISTANCE Description: STG Maintain Skin Integrity With min  Assistance. Outcome: Progressing   Problem: RH SAFETY Goal: RH STG ADHERE TO SAFETY PRECAUTIONS W/ASSISTANCE/DEVICE Description: STG Adhere to Safety Precautions With cues/reminders Assistance/Device. Outcome: Progressing   Problem: RH COGNITION-NURSING Goal: RH STG USES MEMORY AIDS/STRATEGIES W/ASSIST TO PROBLEM SOLVE Description: STG Uses Memory Aids/Strategies With cues/reminders Assistance to Problem Solve. Outcome: Progressing Goal: RH STG ANTICIPATES NEEDS/CALLS FOR ASSIST W/ASSIST/CUES Description: STG Anticipates Needs/Calls for Assist With reminders/Cues. Outcome: Progressing   Problem: RH PAIN MANAGEMENT Goal: RH STG PAIN MANAGED AT OR BELOW PT'S PAIN GOAL Description: At or below level 4 Outcome: Progressing

## 2020-08-20 NOTE — Progress Notes (Signed)
Patient ID: Amy [name], female   DOB: Sep 25, 1950, 70 y.o.   MRN: [id number]  Updated husband via telephone regarding team conference progress, slow and minimal. Working on staying out of bed and up in a chair along with pushing herself in therapies. He feels she needs longer here and wants to talk with MD regarding this. Discussed he has the right to appeal but may only give her two-three more days here. She can not go to a SNF due to PD. Husband feels staff/team needs to not give her a choice and tell her time for therapy and to put on the boots in bed, etc. Asked to speak with MD have text MD to call husband. Will continue to encourage pt to participate in therapies.

## 2020-08-20 NOTE — Progress Notes (Signed)
Physical Therapy Session Note  Patient Details  Name: Amy [name] MRN: [id number] Date of Birth: Dec 29, 1950  Today's Date: 08/20/2020 PT Individual Time: 1002-1033 PT Individual Time Calculation (min): 31 min   Short Term Goals: Week 3:  PT Short Term Goal 1 (Week 3): Patient will perform sit to stand transfers with mod A consistently. PT Short Term Goal 2 (Week 3): Patient will perfom bed/chair transfers with mod A consistently. PT Short Term Goal 3 (Week 3): Patient will ambulate 53' with RW and mod A with w/c follow. PT Short Term Goal 4 (Week 3): standing for at least 1 minute for functional ADL with mod A.  Skilled Therapeutic Interventions/Progress Updates:    Pt received supine in bed and agreeable to therapy session despite reporting fatigue from not sleeping well last night. Noticed pt still had R LE ACE wrap and part of L LE ACE wrap donned, removed, and noticed some intermittent bruising along B LEs but no areas of skin breakdown, only noticed indention in edema from where ACE wrap had been in place for so long - reinforced education with RN to have nursing remove ACE wrap and TED hose at night (sign placed above pt's HOB as well). Pt reports need to use bathroom. Rolling R/L in bed with mod assist, total assist to place bed pan, continent of BM and total assist for peri-care and brief management. Supine in bed donned pants with max assist for threading and then total assist to pull pants over hips in hooklying bridge position to promote B LE strengthening and glute activation. Donned B LE knee high TED hose to promote equalization of edema in B LEs due to indintions made from wearing ACE wraps overnight. Therapist educated patient on importance of sidelying for pressure relief, pt agreeable. Positioned in R sidelying with pillows therapeutically positioned for pressure relief - left with needs in reach and bed alarm on.    Therapy Documentation Precautions:   Precautions Precautions: Fall Precaution Comments: seizure precautions Restrictions Weight Bearing Restrictions: No Other Position/Activity Restrictions: head incision  Pain:   No reports of pain throughout session.  Therapy/Group: Individual Therapy  Tawana Scale , PT, DPT, CSRS  08/20/2020, 7:54 AM

## 2020-08-20 NOTE — Patient Care Conference (Signed)
Inpatient RehabilitationTeam Conference and Plan of Care Update Date: 08/20/2020   Time:  11:35 AM   Patient Name: Amy [name]      Medical Record Number: [id number]  Date of Birth: 1951-01-25 Sex: Female         Room/Bed: 4W06C/4W06C-01 Payor Info: Payor: MEDICARE / Plan: MEDICARE PART A AND B / Product Type: *No Product type* /    Admit Date/Time:  08/04/2020  1:38 PM  Primary Diagnosis:  SDH (subdural hematoma) [facility])  Hospital Problems: Principal Problem:   SDH (subdural hematoma) ([place]) Active Problems:   Subdural hemorrhage following injury ([place])   Acute on chronic anemia   Postoperative pain   Sleep disturbance     Expected Discharge Date: Expected Discharge Date: 09/02/20  Team Members Present: Physician leading conference: Dr. Delice Lesch Care Coodinator Present: Dorien Chihuahua, RN, BSN, CRRN;Becky Dupree, LCSW Nurse Present: Dorien Chihuahua, RN PT Present: Magda Kiel, PT OT Present: Lillia Corporal, OT SLP Present: Charolett Bumpers, SLP PPS Coordinator present : Gunnar Fusi, SLP     Current Status/Progress Goal Weekly Team Focus  Bowel/Bladder   Patient is continent x2  Pt will remain continent x2  Assess q shift and PRN   Swallow/Nutrition/ Hydration             ADL's   UB bathing /dressing CS, LB bathing/dressing mod/max A, toileting max A/dep, increased tolerance for w/c level adl and OOB in recliner.  SPT mod A  min A/CS  adl/transfer training, general conditioning, family education   Mobility   +2 sit to stand with RW and ambulation 8', w/c mobility S 75-80', mod to max A squat pivot to w/c.  CGA to S overall  OOB time, gait with RW, standing tolerance, w/c mobility, transfers, LE strength   Communication             Safety/Cognition/ Behavioral Observations  Mod A, biggest deficit in memory (has memory notebook, remind PT/OT to use it)  Supervision A  mildly complex problem solving, recall strategies, error awareness and attention   Pain   Pt reports  chronic headaches; managed on current regimen  Pain will always remain <3  Assess q shift and PRN   Skin   Bilateral bruising to all extremities; PD abdominal cath; redness to buttocks  Skin will continue to heal and remain infection free  Assess q shift and PRN     Discharge Planning:  Husband making arrangments for 24/7 physical care, aware needs this. Pt continues to want to go home sooner but needs to stay for full stay. Pt not aware of her deficits and how much care she requires   Team Discussion: Nausea has resolved. Progress limited by poor awareness of deficits, poor tolerance of being up and out of bed. Continue to note right lower extremity weakness and fluctuating cognition status.  Patient on target to meet rehab goals: Currently mod assist for memory and attention, mod assist for lower body bathing and dressing and max assist for toileting. Stand pivot transfers with mod assist and amble to ambulate 10' with +2 assist and supervision for wheelchair mobility. Min assist goals set for discharge.  *See Care Plan and progress notes for long and short-term goals.   Revisions to Treatment Plan:  Memory notebook at bedside Working on basic problem solving and awareness  Teaching Needs:   Current Barriers to Discharge: Decreased caregiver support, Home enviroment access/layout and Peritoneal Dialysis  Possible Resolutions to Barriers: Family education Private duty caregivers to  assist spouse with 24/7 care     Medical Summary Current Status: Right hemiparesis and aphasia secondary to large left SDH s/p craniotomy  Barriers to Discharge: Medical stability   Possible Resolutions to Barriers/Weekly Focus: Therapies, monitor for seizures, continue fioricet per home reg   Continued Need for Acute Rehabilitation Level of Care: The patient requires daily medical management by a physician with specialized training in physical medicine and rehabilitation for the following  reasons: Direction of a multidisciplinary physical rehabilitation program to maximize functional independence : Yes Medical management of patient stability for increased activity during participation in an intensive rehabilitation regime.: Yes Analysis of laboratory values and/or radiology reports with any subsequent need for medication adjustment and/or medical intervention. : Yes   I attest that I was present, lead the team conference, and concur with the assessment and plan of the team.   Dorien Chihuahua B 08/20/2020, 1:35 PM

## 2020-08-20 NOTE — Progress Notes (Signed)
[facility] Progress Note   Subjective:   Alert, pleasant, no complaints. Reports PD is going well, no abdominal pain. UF 651mL overnight. Denies SOB, CP, palpitations, dizziness and nausea.   Objective Vitals:   08/19/20 1605 08/19/20 1940 08/20/20 0502 08/20/20 0705  BP: 129/83 (!) 102/54 100/60 114/61  Pulse: 99 (!) 101 84 85  Resp: 15 16 17 16   Temp: 97.6 F (36.4 C) 97.7 F (36.5 C) 98.3 F (36.8 C) 98.1 F (36.7 C)  TempSrc: Oral Oral Oral Oral  SpO2: 100% 98% 100% 100%  Weight: 48 kg   46.8 kg  Height:       Physical Exam General:well developed, thin female, alert and in NAD Heart:RRR, no murmurs, rubs or gallops Lungs:CTA bilaterally without wheezing, rhonchi or rales Abdomen:Soft, non-tender, non-distended, +BS Extremities:No edema bilateral lower extremities Dialysis Access:PD cath in abdomen, no erythema, drainage or tenderness   Additional Objective Labs: Basic Metabolic Panel: Recent Labs  Lab 08/14/20 0528 08/15/20 0504 08/18/20 0612 08/19/20 1027 08/20/20 0538  NA 127* 127* 124* 125* 129*  K 3.6 3.0* 3.6 3.9 4.0  CL 92* 92* 91* 92* 93*  CO2 28 26 26 27 27   GLUCOSE 119* 91 86 85 88  BUN 23 24* 23 24* 23  CREATININE 2.83* 2.80* 2.84* 2.93* 3.05*  CALCIUM 8.2* 8.1* 8.3* 8.1* 8.6*  PHOS 3.3 3.6  --   --   --    Liver Function Tests: Recent Labs  Lab 08/14/20 0528 08/15/20 0504  ALBUMIN 1.1* 1.0*   CBC: Recent Labs  Lab 08/18/20 0612  WBC 7.7  HGB 10.0*  HCT 30.2*  MCV 98.7  PLT 353   Blood Culture    Component Value Date/Time   SDES URINE, CLEAN CATCH 08/15/2020 1209   SPECREQUEST  08/15/2020 1209    NONE Performed at [facility], [address] [place], [place] [address]    CULT MULTIPLE SPECIES PRESENT, SUGGEST RECOLLECTION (A) 08/15/2020 1209   REPTSTATUS 08/16/2020 FINAL 08/15/2020 1209   Medications: . dialysis solution 1.5% low-MG/low-CA     . aspirin EC  81 mg Oral Daily  . atorvastatin   40 mg Oral Daily  . brivaracetam  75 mg Oral BID  . butalbital-acetaminophen-caffeine  1 tablet Oral TID with meals  . citalopram  10 mg Oral Daily  . darbepoetin (ARANESP) injection - NON-DIALYSIS  60 mcg Subcutaneous Q Tue-1800  . docusate sodium  100 mg Oral BID  . gentamicin cream  1 application Topical Daily  . heparin  5,000 Units Subcutaneous Q12H  . kidney failure book   Does not apply Once  . lacosamide  75 mg Oral BID  . melatonin  5 mg Oral QHS  . metoCLOPramide  5 mg Oral TID AC  . metoprolol tartrate  6.25 mg Oral BID  . multivitamin  1 tablet Oral QHS  . pantoprazole  40 mg Oral Daily  . sodium chloride  1 g Oral BID WC  . triamcinolone cream   Topical TID    Dialysis Orders: [place] followed by Dr. Candiss Norse ([place]). 4 exchanges overnight, 2000 cc and 1.5 hrs each.   Assessment/Plan: 1.Concern for UTI: UA looks unremarkable, she is asymptomatic, culture inconclusive. Not on antibiotics at present, low suspicion for UTI. 2. Debility: 2/2 Multiple med issues, most recently SDH recovery and nstemi w/ CAD and low EF. Readmitted to CIR on 08/04/20. 3.Fall w/ subdural hematoma- sp SDH evacuation at [facility] on3/24/22 4. Nausea/ vomiting-  etiology unclear, uremiaunlikely.PD cell count in the past and culture unrevealing, Preliminary report of body fluid culture shows ngtdand effluent has been clear.Symptoms resolved at present.  5. Malnutrition/ debility-verylow albumin. It has been d/w husband on several occassions in the past about a possible trial of HD due to low albumin in a PD patient. He had stated in the past they are not interested and feels she would not tolerate HD. 6. Vol overload-resolved- sig LE dependent edema and UE edemaon admission.Volume status improved since admission and patient is euvolemic on exam. Continues to have good UF with all 1.5% exchanges, will continue.  7. ESRD-ONCCPDin [place], [place], f/b Dr Candiss Norse of [place].Cont  PD nightly4x 2000cc. Blood effluent seen on takedown 4/15 but has not had any moresince.PD gram stain, cultures and cell count not consistent with infection. 8.Multivessel CAD- with reduced EF 25-30% by recent cath in Feb 2022. Per TCTSwasnotCABG candidate. UnderwentPCI to LAD. At [facility] repeat echo per family showed improved EF 55%. 9. Chronichyponatremia-Suspect this is a PO intake issue more so than hypervolemia. Started PO NaCl BID and Na level improved to 129 today.  10. Metabolic acidosis-CO2 27, not on sodium bicarbonate supplementation  11. HTN-BP stable.Continue lopressor 6.25mg  BID 12. Anemiaof ESRD-Hemoglobin 10.0. Continue aranesp. 14.H/oSeizures-per primary team 15.H/oC diff-rx'd in Feb 2022  Anice Paganini, Hershal Coria 08/20/2020, 9:39 AM  [facility] Pager: [phone]

## 2020-08-20 NOTE — Progress Notes (Signed)
Speech Language Pathology Daily Session Note  Patient Details  Name: Amy [name] MRN: [id number] Date of Birth: [birth year]  Today's Date: 08/20/2020 SLP Individual Time: 1430-1500 SLP Individual Time Calculation (min): 30 min  Short Term Goals: Week 3: SLP Short Term Goal 1 (Week 3): Patient will utilize external memory aids to recall new, daily information with supervision A verbal cues. SLP Short Term Goal 2 (Week 3): Pt will demonstrate sustained attention in functional tasks in 30 minue intervals with supervision A verbal cues for redirection. SLP Short Term Goal 3 (Week 3): Pt will demonstrate mildly complex problem solving skills in functional tasks with min A verbal cues. SLP Short Term Goal 4 (Week 3): Pt will demonstrate self-monitor and self-correction in functional errors with min A verbal cues.  Skilled Therapeutic Interventions: Skilled SLP intervention focused on cognition. Pt was sleeping when SLP entered room but participated well. She demonstrated slow processing with mildly complex word fill in puzzle she required max direction to reason and use process of elimination based on spaces available and letters printed in boxes. Mod A visual cues required for redirection due to patient losing track of what she was completing. She utilized external memory aid to recall tasks completed in PT session earlier in the day and names of therapists with min A verbal cues. She required mod A verbal cues and increased time for processing and error awareness. Cont with therapy per pan of care.      Pain Pain Assessment Pain Scale: Faces Pain Score: 0-No pain Faces Pain Scale: No hurt  Therapy/Group: Individual Therapy  Darrol Poke Amire Leazer 08/20/2020, 2:58 PM

## 2020-08-20 NOTE — Progress Notes (Signed)
Occupational Therapy Session Note  Patient Details  Name: Amy [name] MRN: [id number] Date of Birth: 1950-12-25  Today's Date: 08/20/2020 OT Individual Time: 7215-8727 OT Individual Time Calculation (min): 55 min    Short Term Goals: Week 2:  OT Short Term Goal 1 (Week 2): patient will complete UB bathing/dressing tasks edge of bed with min A/CS OT Short Term Goal 1 - Progress (Week 2): Met OT Short Term Goal 2 (Week 2): patient will complete functional transfers mod A OT Short Term Goal 2 - Progress (Week 2): Met OT Short Term Goal 3 (Week 2): patient will tolerate sitting OOB for 1 hour daily OT Short Term Goal 3 - Progress (Week 2): Met Week 3:  OT Short Term Goal 1 (Week 3): patient will complete toileting with mod A OT Short Term Goal 2 (Week 3): patient will complete bed mobility and SPT with min A OT Short Term Goal 3 (Week 3): patient will complete LB bathing/dressing with min a  Skilled Therapeutic Interventions/Progress Updates:    Pt greeted at time of session sitting up in recliner agreeable to OT sesssion with encouragement, declined going out of room and declined ADL. Focus initially on sit <> stands from recliner height with knee blocks and cues for hand/foot placement on AD, 2 helpers present but truly Max A of 1 with second for safety/support. Dynamic standing trials for 30-45 second intervals with lateral weight shifting and cues for hip extension/upright posture. In standing, stated she had a BM and needed to clean up, stand pivot > bed Max/total A. Sit > supine Mod and rolling L/R for brief change dependently, placed on bed pan but unable to void further. Donned pants back over hips max A attempting to bridge and able to partially achieve but ended up needing to roll to fully get over hips. Remainder of session bed level with BUE there ex with ball tosses 2x20 and ball hits with 1# dowel 1x15. Pt wrote in [employer] as well for OT session. Call bell in reach all needs  met alarm on.  Therapy Documentation Precautions:  Precautions Precautions: Fall Precaution Comments: seizure precautions Restrictions Weight Bearing Restrictions: No Other Position/Activity Restrictions: head incision     Therapy/Group: Individual Therapy  Viona Gilmore 08/20/2020, 7:26 AM

## 2020-08-21 DIAGNOSIS — D649 Anemia, unspecified: Secondary | ICD-10-CM | POA: Diagnosis not present

## 2020-08-21 DIAGNOSIS — S065X9A Traumatic subdural hemorrhage with loss of consciousness of unspecified duration, initial encounter: Secondary | ICD-10-CM | POA: Diagnosis not present

## 2020-08-21 DIAGNOSIS — G8918 Other acute postprocedural pain: Secondary | ICD-10-CM | POA: Diagnosis not present

## 2020-08-21 DIAGNOSIS — G479 Sleep disorder, unspecified: Secondary | ICD-10-CM | POA: Diagnosis not present

## 2020-08-21 LAB — BASIC METABOLIC PANEL
Anion gap: 12 (ref 5–15)
BUN: 25 mg/dL — ABNORMAL HIGH (ref 8–23)
CO2: 22 mmol/L (ref 22–32)
Calcium: 8.2 mg/dL — ABNORMAL LOW (ref 8.9–10.3)
Chloride: 94 mmol/L — ABNORMAL LOW (ref 98–111)
Creatinine, Ser: 2.9 mg/dL — ABNORMAL HIGH (ref 0.44–1.00)
GFR, Estimated: 17 mL/min — ABNORMAL LOW (ref >60)
Glucose, Bld: 83 mg/dL (ref 70–99)
Potassium: 3.2 mmol/L — ABNORMAL LOW (ref 3.5–5.1)
Sodium: 128 mmol/L — ABNORMAL LOW (ref 135–145)

## 2020-08-21 MED ORDER — POTASSIUM CHLORIDE CRYS ER 20 MEQ PO TBCR
20.0000 meq | EXTENDED_RELEASE_TABLET | Freq: Once | ORAL | Status: AC
  Administered 2020-08-21: 20 meq via ORAL
  Filled 2020-08-21: qty 1

## 2020-08-21 NOTE — Progress Notes (Signed)
Physical Therapy Session Note  Patient Details  Name: Amy [name] MRN: [id number] Date of Birth: 1950/08/14  Today's Date: 08/21/2020 PT Individual Time:Session1: 1130-1200; Session2: 1515-1600 PT Individual Time Calculation (min): 30 min & 45 min  Short Term Goals: Week 3:  PT Short Term Goal 1 (Week 3): Patient will perform sit to stand transfers with mod A consistently. PT Short Term Goal 2 (Week 3): Patient will perfom bed/chair transfers with mod A consistently. PT Short Term Goal 3 (Week 3): Patient will ambulate 71' with RW and mod A with w/c follow. PT Short Term Goal 4 (Week 3): standing for at least 1 minute for functional ADL with mod A.  Skilled Therapeutic Interventions/Progress Updates:    Session1:  Patient in supine and performed supine therex including ankle pumps, hip abduction, heel slides, and SAQ x 10 each.  Supine to apply Ace wraps total A.  Donned pants with max A rolling to complete donning.  Patient supine to sit with mod A and cues.  Transfer to recliner with mod A max cues and increased time for scooting to EOB, foot placement and hand placement.  Patient left in recliner with call bell and needs in reach.  Session 2: Patient still up in recliner following OT session.  Patient scooting to edge of chair with S to min A and increased time.  Transferred to w/c with mod A and +2 for safety.  Patient in w/c pushed outside to enjoy fresh air and work on orientation to location and discuss history of her hometown.  Patient propelled w/c x 50' with S and increased time with mod cues for steering.  Patient transferred to bed with mod A with +2 for safety using bed rail.  Sit to supine mod A.  Patient scooted up to [facility] and left with call bell and needs in reach with bed alarm active.  Therapy Documentation Precautions:  Precautions Precautions: Fall Precaution Comments: seizure precautions Restrictions Weight Bearing Restrictions: No Other Position/Activity Restrictions:  head incision Pain: Pain Assessment Pain Scale: Faces Pain Score: 0-No pain   Therapy/Group: Individual Therapy  Reginia Naas  Magda Kiel, PT 08/21/2020, 4:13 PM

## 2020-08-21 NOTE — Progress Notes (Signed)
Speech Language Pathology Daily Session Note  Patient Details  Name: ANJOLAOLUWA SIGUENZA MRN: [id number] Date of Birth: [birth year]  Today's Date: 08/21/2020 SLP Individual Time: 1045-1130 SLP Individual Time Calculation (min): 45 min  Short Term Goals: Week 3: SLP Short Term Goal 1 (Week 3): Patient will utilize external memory aids to recall new, daily information with supervision A verbal cues. SLP Short Term Goal 2 (Week 3): Pt will demonstrate sustained attention in functional tasks in 30 minue intervals with supervision A verbal cues for redirection. SLP Short Term Goal 3 (Week 3): Pt will demonstrate mildly complex problem solving skills in functional tasks with min A verbal cues. SLP Short Term Goal 4 (Week 3): Pt will demonstrate self-monitor and self-correction in functional errors with min A verbal cues.  Skilled Therapeutic Interventions: Skilled SLP intervention focused on cognition. Reviewed strategies for improving attention with tasks once home. Sustained attention card sorting with numbers completed with min A for direction and recall of task instructions.   Reviewed memory notebook and task completed in previous session. Pt able to elaborate on tasks completed in PT and current physical limitations with min A verbal cues. Cont with therapy per plan of care.      Pain Pain Assessment Pain Scale: Faces Pain Score: 0-No pain Faces Pain Scale: No hurt  Therapy/Group: Individual Therapy  Darrol Poke Crestina Strike 08/21/2020, 11:26 AM

## 2020-08-21 NOTE — Progress Notes (Signed)
[facility] Progress Note   Subjective:   Seen in room, husband at bedside. Reports she is feeling well. No SOB, CP, dizziness, abdominal pain, or nausea. PD effluent remains clear.   Objective Vitals:   08/20/20 1950 08/21/20 0409 08/21/20 0500 08/21/20 0830  BP: 113/68 102/63  113/63  Pulse: 83 79  88  Resp: 17 16  16   Temp: 98.4 F (36.9 C) 98.3 F (36.8 C)  98.9 F (37.2 C)  TempSrc:    Oral  SpO2: 100% 99%  100%  Weight:   49.1 kg 47.4 kg  Height:       Physical Exam General:well developed, thin female, alert and in NAD Heart:RRR, no murmurs, rubs or gallops Lungs:CTA bilaterally without wheezing, rhonchi or rales Abdomen:Soft, non-tender, non-distended, +BS Extremities:No edema bilateral lower extremities Dialysis Access:PD cath in abdomen, no erythema, drainage or tenderness  Additional Objective Labs: Basic Metabolic Panel: Recent Labs  Lab 08/15/20 0504 08/18/20 0612 08/19/20 1027 08/20/20 0538 08/21/20 0510  NA 127*   < > 125* 129* 128*  K 3.0*   < > 3.9 4.0 3.2*  CL 92*   < > 92* 93* 94*  CO2 26   < > 27 27 22   GLUCOSE 91   < > 85 88 83  BUN 24*   < > 24* 23 25*  CREATININE 2.80*   < > 2.93* 3.05* 2.90*  CALCIUM 8.1*   < > 8.1* 8.6* 8.2*  PHOS 3.6  --   --   --   --    < > = values in this interval not displayed.   Liver Function Tests: Recent Labs  Lab 08/15/20 0504  ALBUMIN 1.0*   CBC: Recent Labs  Lab 08/18/20 0612  WBC 7.7  HGB 10.0*  HCT 30.2*  MCV 98.7  PLT 353   Blood Culture    Component Value Date/Time   SDES URINE, CLEAN CATCH 08/15/2020 1209   [place]  08/15/2020 1209    NONE Performed at [facility], [place] [address], [place], [place] [address]    CULT MULTIPLE SPECIES PRESENT, SUGGEST RECOLLECTION (A) 08/15/2020 1209   REPTSTATUS 08/16/2020 FINAL 08/15/2020 1209   Medications: . dialysis solution 1.5% low-MG/low-CA     . aspirin EC  81 mg Oral Daily  . atorvastatin  40 mg Oral  Daily  . brivaracetam  75 mg Oral BID  . butalbital-acetaminophen-caffeine  1 tablet Oral TID with meals  . citalopram  10 mg Oral Daily  . darbepoetin (ARANESP) injection - NON-DIALYSIS  60 mcg Subcutaneous Q Tue-1800  . docusate sodium  100 mg Oral BID  . gentamicin cream  1 application Topical Daily  . heparin  5,000 Units Subcutaneous Q12H  . kidney failure book   Does not apply Once  . lacosamide  75 mg Oral BID  . melatonin  5 mg Oral QHS  . metoCLOPramide  5 mg Oral TID AC  . metoprolol tartrate  6.25 mg Oral BID  . multivitamin  1 tablet Oral QHS  . pantoprazole  40 mg Oral Daily  . sodium chloride  1 g Oral BID WC  . triamcinolone cream   Topical TID    Dialysis Orders: [place] followed by Dr. Candiss Norse ([place]). 4 exchanges overnight, 2000 cc and 1.5 hrs each.  Assessment/Plan: 1.Concern for UTI: UA looks unremarkable, she is asymptomatic, culture inconclusive. Not on antibiotics at present, low suspicion for UTI. 2. Debility: 2/2 Multiple med issues, most  recently SDH recovery and nstemi w/ CAD and low EF. Readmitted to CIR on 08/04/20. 3.Fall w/ subdural hematoma- sp SDH evacuation at [facility] on3/24/22 4. Nausea/ vomiting- etiology unclear, uremiaunlikely.PD cell count in the past and culture unrevealing, Preliminary report of body fluid culture shows ngtdand effluent has been clear.Symptoms resolved at present.  5. Malnutrition/ debility-verylow albumin. It has been d/w husband on several occassions in the past about a possible trial of HD due to low albumin in a PD patient. He had stated in the past they are not interested and feels she would not tolerate HD. 6. Vol overload-resolved- sig LE dependent edema and UE edemaon admission.Volume status improved since admission and patient is euvolemic on exam.Continues to have good UF with all 1.5% exchanges, will continue. 7. ESRD-ONCCPDin [place], [place], f/b Dr Candiss Norse of [place].Cont PD  nightly4x 2000cc. Blood effluent seen on takedown 4/15 but has not had any moresince.PD gram stain, cultures and cell count not consistent with infection. 8.Multivessel CAD- with reduced EF 25-30% by recent cath in Feb 2022. Per TCTSwasnotCABG candidate. UnderwentPCI to LAD. At [facility] repeat echo per family showed improved EF 55%. 9. Chronichyponatremia-Suspect this is a PO intake issue more so than hypervolemia. Started PO NaCl BID and Na level improved to 128 today.  10. Hypokalemia: Mild, K+ 3.2. Will give PO potassium chloride 62mEq x1 today.  11. Metabolic acidosis-CO2 27, not on sodium bicarbonate supplementation  12. HTN-BP stable.Continue lopressor 6.25mg  BID 13 Anemiaof ESRD-Hemoglobin 10.0. Continue aranesp. 14.H/oSeizures-per primary team 15.H/oC diff-rx'd in Feb 2022  Anice Paganini, Hershal Coria 08/21/2020, 10:54 AM  [facility] Pager: [phone]

## 2020-08-21 NOTE — Progress Notes (Signed)
Occupational Therapy Session Note  Patient Details  Name: Amy [name] MRN: [id number] Date of Birth: 20-Nov-1950  Today's Date: 08/21/2020 OT Individual Time: 1350-1445 OT Individual Time Calculation (min): 55 min    Short Term Goals: Week 3:  OT Short Term Goal 1 (Week 3): patient will complete toileting with mod A OT Short Term Goal 2 (Week 3): patient will complete bed mobility and SPT with min A OT Short Term Goal 3 (Week 3): patient will complete LB bathing/dressing with min a  Skilled Therapeutic Interventions/Progress Updates:    Pt completed stand pivot transfer from the recliner to the wheelchair with max assist.  She then worked on oral hygiene with setup at the sink.  Once complete, she was rolled down to the dayroom where she completed squat pivot transfer to the therapy mat with mod.  Worked on sitting balance with use of the mirror for feedback.  Increased lean to the right noted with posterior pelvic tilt.  She was able to correct with min demonstrational cueing.  Completed sit to stand from the mat with total +2 (pt 40%) with use of the RW.  Standing balance was completed at overall min assist for up to two minute intervals.  Mod demonstrational cueing needed to maintain upright posture with hip, trunk, and cervical extension.  She was also able to complete functional mobility with use of the RW at total assist +2 (pt 40%) for approximately 6'.  Finished session with return to the room and transfer to the recliner with max assist stand pivot without an assistive device.  She was left with the call button and phone in reach.      Therapy Documentation Precautions:  Precautions Precautions: Fall Precaution Comments: seizure precautions Restrictions Weight Bearing Restrictions: No Other Position/Activity Restrictions: head incision   Pain: Pain Assessment Pain Scale: Faces Pain Score: 0-No pain ADL: See Care Tool Section for some details of mobility and  selfcare  Therapy/Group: Individual Therapy  Evalee Gerard OTR/L 08/21/2020, 4:06 PM

## 2020-08-21 NOTE — Progress Notes (Signed)
[facility] PHYSICAL MEDICINE & REHABILITATION PROGRESS NOTE  Subjective/Complaints: Patient seen lying in bed this morning.  She states she slept well overnight.  She denies nausea.  Received message from case manager yesterday regarding husband's request for call.  Called husband, but no answer.  Voicemail left for call back.  ROS: Denies CP, SOB, nausea, vomiting, diarrhea.  Objective: Vital Signs: Blood pressure 113/63, pulse 88, temperature 98.9 F (37.2 C), temperature source Oral, resp. rate 16, height 5\' 4"  (1.626 m), weight 47.4 kg, SpO2 100 %. No results found. No results for input(s): WBC, HGB, HCT, PLT in the last 72 hours. Recent Labs    08/20/20 0538 08/21/20 0510  NA 129* 128*  K 4.0 3.2*  CL 93* 94*  CO2 27 22  GLUCOSE 88 83  BUN 23 25*  CREATININE 3.05* 2.90*  CALCIUM 8.6* 8.2*    Intake/Output Summary (Last 24 hours) at 08/21/2020 0911 Last data filed at 08/20/2020 1842 Gross per 24 hour  Intake 297 ml  Output --  Net 297 ml        Physical Exam: BP 113/63 (BP Location: Right Arm)   Pulse 88   Temp 98.9 F (37.2 C) (Oral)   Resp 16   Ht 5\' 4"  (1.626 m)   Wt 47.4 kg   SpO2 100%   BMI 17.94 kg/m  Constitutional: No distress . Vital signs reviewed. HENT: Normocephalic.  Atraumatic. Eyes: EOMI. No discharge. Cardiovascular: No JVD.  RRR. Respiratory: Normal effort.  No stridor.  Bilateral clear to auscultation. GI: Non-distended.  BS +. Skin: Warm and dry.  Intact. Psych: Normal mood.  Normal behavior. Musc: No edema in extremities.  No tenderness in extremities. Neuro: Alert Motor: Motor: LUE: 4-4+/5 proximal distal, unchanged RUE: 4/5 proximal distal LLE: Hip flexion 4-/5, knee extension 4-/5, ankle dorsiflexion 4-/5, stable RLE: Hip flexion 3+/5, knee extension 3+/5, ankle dorsiflexion 3-3+/5   Assessment/Plan: 1. Functional deficits which require 3+ hours per day of interdisciplinary therapy in a comprehensive inpatient rehab  setting.  Physiatrist is providing close team supervision and 24 hour management of active medical problems listed below.  Physiatrist and rehab team continue to assess barriers to discharge/monitor patient progress toward functional and medical goals   Care Tool:  Bathing    Body parts bathed by patient: Right arm,Left arm,Chest,Abdomen,Front perineal area,Face,Right upper leg,Left upper leg,Buttocks,Right lower leg,Left lower leg   Body parts bathed by helper: Buttocks     Bathing assist Assist Level: Minimal Assistance - Patient > 75%     Upper Body Dressing/Undressing Upper body dressing   What is the patient wearing?: Pull over shirt    Upper body assist Assist Level: Supervision/Verbal cueing    Lower Body Dressing/Undressing Lower body dressing      What is the patient wearing?: Pants     Lower body assist Assist for lower body dressing: Moderate Assistance - Patient 50 - 74%     Toileting Toileting    Toileting assist Assist for toileting: 2 Helpers     Transfers Chair/bed transfer  Transfers assist     Chair/bed transfer assist level: Moderate Assistance - Patient 50 - 74% Chair/bed transfer assistive device: Armrests   Locomotion Ambulation   Ambulation assist   Ambulation activity did not occur: Safety/medical concerns  Assist level: 2 helpers Assistive device: Walker-rolling Max distance: 10   Walk 10 feet activity   Assist  Walk 10 feet activity did not occur: Safety/medical concerns  Assist level: 2 helpers Assistive device:  Walker-rolling   Walk 50 feet activity   Assist Walk 50 feet with 2 turns activity did not occur: Safety/medical concerns         Walk 150 feet activity   Assist Walk 150 feet activity did not occur: Safety/medical concerns         Walk 10 feet on uneven surface  activity   Assist Walk 10 feet on uneven surfaces activity did not occur: Safety/medical concerns          Wheelchair     Assist Will patient use wheelchair at discharge?: Yes Type of Wheelchair: Manual    Wheelchair assist level: Supervision/Verbal cueing Max wheelchair distance: 2'    Wheelchair 50 feet with 2 turns activity    Assist    Wheelchair 50 feet with 2 turns activity did not occur: Safety/medical concerns   Assist Level: Supervision/Verbal cueing   Wheelchair 150 feet activity     Assist  Wheelchair 150 feet activity did not occur: Safety/medical concerns        Medical Problem List and Plan: 1.  Right hemiparesis and aphasia secondary to large left SDH s/p craniotomy  Continue CIR 2.  Impaired mobility -DVT/anticoagulation:  Mechanical: Continue Sequential compression devices, below knee Bilateral lower extremities             -antiplatelet therapy: N/A due to bleed.  3. Chronic migraines/ Pain Management: Continue Fioricet 3-4 times a day on average. Added prn Fioricet q8H  Controlled with meds on 4/21  Monitor with increased exertion 4. Mood: LCSW to follow for evaluation and support.  -antipsychotic agents: N/A 5. Neuropsych: This patient is not fully capable of making decisions on her own behalf. 6. Skin/Wound Care: Monitor wound for healing.             -Family to provide meals from home as able.  -bilateral PRAFOs ordered but patient refuses to wear 7. Fluids/Electrolytes/Nutrition: Continue PD--does not need fluid restriction.              --has had chronic issues with variable intake as well as N/V likely due to gastroparesis.  8. Chronic Hypotension: ContinueTEDs when out of bed             Vitals:   08/21/20 0409 08/21/20 0830  BP: 102/63 113/63  Pulse: 79 88  Resp: 16 16  Temp: 98.3 F (36.8 C) 98.9 F (37.2 C)  SpO2: 99% 100%   Controlled on 4/21 9.  ESRD on PD: Nephrology to assist/manage PD.  10.  Recurrent Seizures: Has been seizure free, continue Brivaracetam and Lacosamide bid.              --Lacosamide was decreased on  03/31 to avoid sedative SE.  No breakthrough seizures since admission- 4/21 11. CAD/NSTEMI s/p PCI: Treated medically.   Off DAPT due to bleed   Continue Lipitor.   Metoprolol low-dose resumed  Heart rate controlled on 4/21 12. Acute on chronic anemia:   Hemoglobin 10.0 on 4/18, labs ordered for tomorrow  Continue to monitor 13. H/o Depression: Continue Celexa 14. Sick Euthyroid: Will need follow up as illness resolves.              --TSH- 5.80/Free T4-1.2 (03/27) 15. Gastroparesis w/chronic N/V/anorexia: Has been ongoing per records review.              --question failure of PD (husband reports no N/V with brief episode of HD in the past)  Continue Reglan 5 3 times daily  Hesitant to continue increase medications due to #18  Improving 16.  Hyponatremia             --chronic hyponatremia with Na-130-133 range  Sodium 129 on 4/20             --Continue Sodium bicarb to manage metabolic acidosis. 17.  Severe hypoalbuminemia  Supplement d/ced given nausea and vomiting- recommended nuts as source of protein. Her daughter has brought in.  18.  Prolonged QTC  ECG reviewed, improved since last visit  Compazine and dose/frequency decreased 19.  Sleep disturbance- "never a good sleeper" , on PD a night and gets disturbed by hospital sounds, cont trazodone and melatonin    Stable  LOS: 17 days A FACE TO FACE EVALUATION WAS PERFORMED  Fredericka Bottcher Lorie Phenix 08/21/2020, 9:11 AM

## 2020-08-22 DIAGNOSIS — S065X9A Traumatic subdural hemorrhage with loss of consciousness of unspecified duration, initial encounter: Secondary | ICD-10-CM | POA: Diagnosis not present

## 2020-08-22 DIAGNOSIS — D649 Anemia, unspecified: Secondary | ICD-10-CM | POA: Diagnosis not present

## 2020-08-22 DIAGNOSIS — G479 Sleep disorder, unspecified: Secondary | ICD-10-CM | POA: Diagnosis not present

## 2020-08-22 DIAGNOSIS — G8918 Other acute postprocedural pain: Secondary | ICD-10-CM | POA: Diagnosis not present

## 2020-08-22 DIAGNOSIS — E876 Hypokalemia: Secondary | ICD-10-CM

## 2020-08-22 LAB — BASIC METABOLIC PANEL
Anion gap: 9 (ref 5–15)
BUN: 23 mg/dL (ref 8–23)
CO2: 25 mmol/L (ref 22–32)
Calcium: 8.1 mg/dL — ABNORMAL LOW (ref 8.9–10.3)
Chloride: 93 mmol/L — ABNORMAL LOW (ref 98–111)
Creatinine, Ser: 2.87 mg/dL — ABNORMAL HIGH (ref 0.44–1.00)
GFR, Estimated: 17 mL/min — ABNORMAL LOW (ref >60)
Glucose, Bld: 89 mg/dL (ref 70–99)
Potassium: 3.4 mmol/L — ABNORMAL LOW (ref 3.5–5.1)
Sodium: 127 mmol/L — ABNORMAL LOW (ref 135–145)

## 2020-08-22 MED ORDER — POTASSIUM CHLORIDE CRYS ER 20 MEQ PO TBCR
20.0000 meq | EXTENDED_RELEASE_TABLET | Freq: Once | ORAL | Status: AC
  Administered 2020-08-22: 20 meq via ORAL
  Filled 2020-08-22: qty 1

## 2020-08-22 MED ORDER — GENTAMICIN SULFATE 0.1 % EX CREA
1.0000 "application " | TOPICAL_CREAM | Freq: Every day | CUTANEOUS | Status: DC
  Administered 2020-08-23 – 2020-08-29 (×5): 1 via TOPICAL
  Filled 2020-08-22: qty 15

## 2020-08-22 NOTE — Progress Notes (Signed)
PHYSICAL MEDICINE & REHABILITATION PROGRESS NOTE  Subjective/Complaints: Patient seen sitting up in her chair working with therapy this morning.  She states she slept well overnight.  Discussed [place] with therapies.  She is seen by nephrology today, notes reviewed- potassium supplement ordered.  ROS: Denies CP, SOB, nausea, vomiting, diarrhea.  Objective: Vital Signs: Blood pressure 126/69, pulse 86, temperature 98.2 F (36.8 C), temperature source Oral, resp. rate 17, height 5\' 4"  (1.626 m), weight 45.7 kg, SpO2 100 %. No results found. No results for input(s): WBC, HGB, HCT, PLT in the last 72 hours. Recent Labs    08/21/20 0510 08/22/20 0654  NA 128* 127*  K 3.2* 3.4*  CL 94* 93*  CO2 22 25  GLUCOSE 83 89  BUN 25* 23  CREATININE 2.90* 2.87*  CALCIUM 8.2* 8.1*    Intake/Output Summary (Last 24 hours) at 08/22/2020 1252 Last data filed at 08/22/2020 1308 Gross per 24 hour  Intake 476 ml  Output --  Net 476 ml        Physical Exam: BP 126/69 (BP Location: Right Arm)   Pulse 86   Temp 98.2 F (36.8 C) (Oral)   Resp 17   Ht 5\' 4"  (1.626 m)   Wt 45.7 kg   SpO2 100%   BMI 17.29 kg/m  Constitutional: No distress . Vital signs reviewed. HENT: Normocephalic.  Atraumatic. Eyes: EOMI. No discharge. Cardiovascular: No JVD.  RRR. Respiratory: Normal effort.  No stridor.  Bilateral clear to auscultation. GI: Non-distended.  BS +. Skin: Warm and dry.  Intact. Psych: Normal mood.  Normal behavior. Musc: No edema in extremities.  No tenderness in extremities. Neuro: Alert Motor: Motor: LUE: 4-4+/5 proximal distal, unchanged RUE: 4/5 proximal distal LLE: Hip flexion 4-/5, knee extension 4-/5, ankle dorsiflexion 4-/5, unchanged RLE: Hip flexion 3+/5, knee extension 3+/5, ankle dorsiflexion 3-3+/5, unchanged   Assessment/Plan: 1. Functional deficits which require 3+ hours per day of interdisciplinary therapy in a comprehensive inpatient rehab  setting.  Physiatrist is providing close team supervision and 24 hour management of active medical problems listed below.  Physiatrist and rehab team continue to assess barriers to discharge/monitor patient progress toward functional and medical goals   Care Tool:  Bathing    Body parts bathed by patient: Right arm,Left arm,Chest,Abdomen,Front perineal area,Face,Right upper leg,Left upper leg,Buttocks,Right lower leg,Left lower leg   Body parts bathed by helper: Buttocks     Bathing assist Assist Level: Minimal Assistance - Patient > 75%     Upper Body Dressing/Undressing Upper body dressing   What is the patient wearing?: Pull over shirt    Upper body assist Assist Level: Supervision/Verbal cueing    Lower Body Dressing/Undressing Lower body dressing      What is the patient wearing?: Pants     Lower body assist Assist for lower body dressing: Moderate Assistance - Patient 50 - 74%     Toileting Toileting    Toileting assist Assist for toileting: Maximal Assistance - Patient 25 - 49%     Transfers Chair/bed transfer  Transfers assist     Chair/bed transfer assist level: Moderate Assistance - Patient 50 - 74% Chair/bed transfer assistive device: Armrests   Locomotion Ambulation   Ambulation assist   Ambulation activity did not occur: Safety/medical concerns  Assist level: Moderate Assistance - Patient 50 - 74% Assistive device: Walker-rolling Max distance: 6'   Walk 10 feet activity   Assist  Walk 10 feet activity did not occur: Safety/medical concerns  Assist level: 2 helpers Assistive device: Walker-rolling   Walk 50 feet activity   Assist Walk 50 feet with 2 turns activity did not occur: Safety/medical concerns         Walk 150 feet activity   Assist Walk 150 feet activity did not occur: Safety/medical concerns         Walk 10 feet on uneven surface  activity   Assist Walk 10 feet on uneven surfaces activity did not  occur: Safety/medical concerns         Wheelchair     Assist Will patient use wheelchair at discharge?: Yes Type of Wheelchair: Manual    Wheelchair assist level: Supervision/Verbal cueing Max wheelchair distance: 3'    Wheelchair 50 feet with 2 turns activity    Assist    Wheelchair 50 feet with 2 turns activity did not occur: Safety/medical concerns   Assist Level: Supervision/Verbal cueing   Wheelchair 150 feet activity     Assist  Wheelchair 150 feet activity did not occur: Safety/medical concerns        Medical Problem List and Plan: 1.  Right hemiparesis and aphasia secondary to large left SDH s/p craniotomy  Continue CIR 2.  Impaired mobility -DVT/anticoagulation:  Mechanical: Continue Sequential compression devices, below knee Bilateral lower extremities             -antiplatelet therapy: N/A due to bleed.  3. Chronic migraines/ Pain Management: Continue Fioricet 3-4 times a day on average. Added prn Fioricet q8H  Controlled with meds on 4/22  Monitor with increased exertion 4. Mood: LCSW to follow for evaluation and support.  -antipsychotic agents: N/A 5. Neuropsych: This patient is not fully capable of making decisions on her own behalf. 6. Skin/Wound Care: Monitor wound for healing.             -Family to provide meals from home as able.  -bilateral PRAFOs ordered but patient refuses to wear 7. Fluids/Electrolytes/Nutrition: Continue PD--does not need fluid restriction.              --has had chronic issues with variable intake as well as N/V likely due to gastroparesis.  8. Chronic Hypotension: ContinueTEDs when out of bed             Vitals:   08/22/20 0413 08/22/20 0757  BP: 116/66 126/69  Pulse: 72 86  Resp: 18 17  Temp: 97.8 F (36.6 C) 98.2 F (36.8 C)  SpO2: 98% 100%   Controlled on 4/22 9.  ESRD on PD: Nephrology to assist/manage PD.  10.  Recurrent Seizures: Has been seizure free, continue Brivaracetam and Lacosamide bid.               --Lacosamide was decreased on 03/31 to avoid sedative SE.  No breakthrough seizures since admission- 4/22 11. CAD/NSTEMI s/p PCI: Treated medically.   Off DAPT due to bleed   Continue Lipitor.   Metoprolol low-dose resumed  Heart rate controlled on 4/22 12. Acute on chronic anemia:   Hemoglobin 10.0 on 4/18, repeat labs not completed  Continue to monitor 13. H/o Depression: Continue Celexa 14. Sick Euthyroid: Will need follow up as illness resolves.              --TSH- 5.80/Free T4-1.2 (03/27) 15. Gastroparesis w/chronic N/V/anorexia: Has been ongoing per records review.              --question failure of PD (husband reports no N/V with brief episode of HD in the past)  Continue  Reglan 5 3 times daily  Hesitant to continue increase medications due to #18  Improved 16.  Hyponatremia             --chronic hyponatremia with Na-130-133 range  Sodium 129 on 4/22             --Continue Sodium bicarb to manage metabolic acidosis. 17.  Severe hypoalbuminemia  Supplement d/ced given nausea and vomiting- recommended nuts as source of protein. Her daughter has brought in.  18.  Prolonged QTC  ECG reviewed, improved since last visit  Compazine and dose/frequency decreased 19.  Sleep disturbance- "never a good sleeper" , on PD a night and gets disturbed by hospital sounds, cont trazodone and melatonin    Reasonably controlled 20.  Hypokalemia  Potassium 3.4 on 4/22  Supplemented by nephrology  LOS: 18 days A FACE TO FACE EVALUATION WAS PERFORMED  Amy [name] Amy [name] 08/22/2020, 12:52 PM

## 2020-08-22 NOTE — Progress Notes (Signed)
Occupational Therapy Session Note  Patient Details  Name: ALETA MANTERNACH MRN: [id number] Date of Birth: Jan 12, 1951  Today's Date: 08/22/2020 OT Individual Time: 1345-1445 OT Individual Time Calculation (min): 60 min   Short Term Goals: Week 3:  OT Short Term Goal 1 (Week 3): patient will complete toileting with mod A OT Short Term Goal 2 (Week 3): patient will complete bed mobility and SPT with min A OT Short Term Goal 3 (Week 3): patient will complete LB bathing/dressing with min a  Skilled Therapeutic Interventions/Progress Updates:    Pt greeted sitting in recliner and agreeable to OT treatment session. Pt reports headache and nursing notified. Pt agreeable to go to the bathroom. Stedy used for transfer to [facility]. Max A sit<>stand in [place] from lower recliner. Pt needed OT assist for clothing management. Pt with successful BM and voided bladder. Sit<>stand from lower BSC with max A and OT assist for peri-care and to don new brief. Pt needed a seated rest break prior to standing again to pull pants all the way up. Pt took seat in [place] for rest. Pt opted to go back to recliner and complete UB there-ex. Pt completed 3 sets of 10 bicep curls, chest press, and straight arm raises using 1 lb dowel rod. OT also had pt complete 3 sets of 10 seated rows using orange theraband. Pt left seated in recliner with LEs elevated, call bell in reach, and needs met.   Therapy Documentation Precautions:  Precautions Precautions: Fall Precaution Comments: seizure precautions Restrictions Weight Bearing Restrictions: No Other Position/Activity Restrictions: head incision Pain: Pain Assessment Pain Scale: 0-10 Pain Score: 7  Pain Type: Acute pain Pain Location: Head Pain Descriptors / Indicators: Headache Pain Frequency: Constant Pain Onset: On-going Pain Intervention(s): Medication (See eMAR)  Therapy/Group: Individual Therapy  Valma Cava 08/22/2020, 2:20 PM

## 2020-08-22 NOTE — Progress Notes (Signed)
Speech Language Pathology TBI Note  Patient Details  Name: Amy [name] MRN: [id number] Date of Birth: [birth year]  Today's Date: 08/22/2020 SLP Individual Time: 1515-1600 SLP Individual Time Calculation (min): 45 min  Short Term Goals: Week 3: SLP Short Term Goal 1 (Week 3): Patient will utilize external memory aids to recall new, daily information with supervision A verbal cues. SLP Short Term Goal 2 (Week 3): Pt will demonstrate sustained attention in functional tasks in 30 minue intervals with supervision A verbal cues for redirection. SLP Short Term Goal 3 (Week 3): Pt will demonstrate mildly complex problem solving skills in functional tasks with min A verbal cues. SLP Short Term Goal 4 (Week 3): Pt will demonstrate self-monitor and self-correction in functional errors with min A verbal cues.  Skilled Therapeutic Interventions: Pt seen for skilled ST with focus on cognitive goals, pt up in recliner and agreeable to all therapeutic tasks. Pt able to recall events from OT/PT tx session earlier in the day with ~60% accuracy, utilized memory book to increase carryover of information. SLP facilitating mildly complex problem solving task by providing mod A verbal cues for thoroughness and increasing sustained attention to task. Episodes of word finding deficits noted more frequently during structured tasks unstructured, educated on strategies to increase word finding and decrease frustration. Pt left in recline with alarm set and all needs within reach. Cont ST POC.   Pain Pain Assessment Pain Scale: 0-10 Pain Score: 0-No pain Pain Type: Acute pain Pain Location: Head Pain Descriptors / Indicators: Headache Pain Frequency: Constant Pain Onset: On-going Pain Intervention(s): Medication (See eMAR)  Agitated Behavior Scale: TBI Observation Details Observation Environment: pt room Start of observation period - Date: 08/22/20 Start of observation period - Time: 1515 End of observation  period - Date: 08/22/20 End of observation period - Time: 1600 Agitated Behavior Scale (DO NOT LEAVE BLANKS) Short attention span, easy distractibility, inability to concentrate: Present to a slight degree Impulsive, impatient, low tolerance for pain or frustration: Absent Uncooperative, resistant to care, demanding: Absent Violent and/or threatening violence toward people or property: Absent Explosive and/or unpredictable anger: Absent Rocking, rubbing, moaning, or other self-stimulating behavior: Absent Pulling at tubes, restraints, etc.: Absent Wandering from treatment areas: Absent Restlessness, pacing, excessive movement: Absent Repetitive behaviors, motor, and/or verbal: Absent Rapid, loud, or excessive talking: Absent Sudden changes of mood: Absent Easily initiated or excessive crying and/or laughter: Absent Self-abusiveness, physical and/or verbal: Absent Agitated behavior scale total score: 15  Therapy/Group: Individual Therapy  Dewaine Conger 08/22/2020, 3:56 PM

## 2020-08-22 NOTE — Progress Notes (Signed)
[facility] Progress Note   Subjective:   Reports feeling well this AM, no issues with PD. Denies SOB, CP, dizziness, abdominal pain and nausea.   Objective Vitals:   08/21/20 1953 08/22/20 0413 08/22/20 0500 08/22/20 0757  BP: 115/66 116/66  126/69  Pulse: 71 72  86  Resp: 16 18  17   Temp: 97.9 F (36.6 C) 97.8 F (36.6 C)  98.2 F (36.8 C)  TempSrc: Oral   Oral  SpO2: 100% 98%  100%  Weight:   47.8 kg 45.7 kg  Height:       Physical Exam General:well developed, thin female, alert and in NAD Heart:RRR, no murmurs, rubs or gallops Lungs:CTA bilaterally without wheezing, rhonchi or rales Abdomen:Soft, non-tender, non-distended, +BS Extremities:No edema bilateral lower extremities Dialysis Access:PD cath in abdomen, no erythema, drainage or tenderness  Additional Objective Labs: Basic Metabolic Panel: Recent Labs  Lab 08/20/20 0538 08/21/20 0510 08/22/20 0654  NA 129* 128* 127*  K 4.0 3.2* 3.4*  CL 93* 94* 93*  CO2 27 22 25   GLUCOSE 88 83 89  BUN 23 25* 23  CREATININE 3.05* 2.90* 2.87*  CALCIUM 8.6* 8.2* 8.1*   CBC: Recent Labs  Lab 08/18/20 0612  WBC 7.7  HGB 10.0*  HCT 30.2*  MCV 98.7  PLT 353   Blood Culture    Component Value Date/Time   SDES URINE, CLEAN CATCH 08/15/2020 1209   SPECREQUEST  08/15/2020 1209    NONE Performed at [facility] [address] [place], [place] [address]    CULT MULTIPLE SPECIES PRESENT, SUGGEST RECOLLECTION (A) 08/15/2020 1209   REPTSTATUS 08/16/2020 FINAL 08/15/2020 1209   Medications: . dialysis solution 1.5% low-MG/low-CA     . aspirin EC  81 mg Oral Daily  . atorvastatin  40 mg Oral Daily  . brivaracetam  75 mg Oral BID  . butalbital-acetaminophen-caffeine  1 tablet Oral TID with meals  . citalopram  10 mg Oral Daily  . darbepoetin (ARANESP) injection - NON-DIALYSIS  60 mcg Subcutaneous Q Tue-1800  . docusate sodium  100 mg Oral BID  . gentamicin cream  1 application Topical  Daily  . heparin  5,000 Units Subcutaneous Q12H  . kidney failure book   Does not apply Once  . lacosamide  75 mg Oral BID  . melatonin  5 mg Oral QHS  . metoCLOPramide  5 mg Oral TID AC  . metoprolol tartrate  6.25 mg Oral BID  . multivitamin  1 tablet Oral QHS  . pantoprazole  40 mg Oral Daily  . sodium chloride  1 g Oral BID WC  . triamcinolone cream   Topical TID    Dialysis Orders:  [place] followed by Dr. Candiss Norse ([place]). 4 exchanges overnight, 2000 cc and 1.5 hrs each.  Assessment/Plan:  1.Concern for UTI: UA looksunremarkable, she is asymptomatic, culture inconclusive. Not on antibiotics at present, low suspicion for UTI. 2. Debility: 2/2 Multiple med issues, most recently SDH recovery and nstemi w/ CAD and low EF. Readmitted to CIR on 08/04/20. 3.Fall w/ subdural hematoma- sp SDH evacuation at [facility] on3/24/22 4. Nausea/ vomiting- etiology unclear, uremiaunlikely.PD cell count in the past and culture unrevealing, Preliminary report of body fluid culture shows ngtdand effluent has been clear.Symptomsresolved at present. 5. Malnutrition/ debility-verylow albumin. It has been d/w husband on several occassions in the past about a possible trial of HD due to low albumin in a PD patient. He had stated in the past they  are not interested and feels she would not tolerate HD. 6. Vol overload-resolved- sig LE dependent edema and UE edemaon admission.Volume status improved since admission and patient is euvolemic on exam.Continues to have good UF with all 1.5% exchanges, will continue. 7. ESRD-ONCCPDin [place], [place], f/b Dr Candiss Norse of [place].Cont PD nightly4x 2000cc. Blood effluent seen on takedown 4/15 but has not had any moresince.PD gram stain, cultures and cell count not consistent with infection. 8.Multivessel CAD- with reduced EF 25-30% by recent cath in Feb 2022. Per TCTSwasnotCABG candidate. UnderwentPCI to LAD. At [facility] repeat echo  per family showed improved EF 55%. 9. Chronichyponatremia-Suspect this is a PO intake issue more so than hypervolemia.StartedPO NaCl BID, trend sodium levels.  10. Hypokalemia: Mild, K+ 3.4. Will give another dose of PO potassium chloride 46mEq x1 today.  11. Metabolic acidosis-CO2 at goal, not on sodium bicarbonate supplementation  12. HTN-BP stable.Continue lopressor 6.25mg  BID 13 Anemiaof ESRD-Hemoglobin 10.0. Continue aranesp. 14.H/oSeizures-per primary team 15.H/oC diff-rx'd in Feb 2022   Anice Paganini, PA-C 08/22/2020, 10:08 AM  [facility] Pager: [phone]

## 2020-08-22 NOTE — Progress Notes (Signed)
Physical Therapy Session Note  Patient Details  Name: Amy [name] MRN: [id number] Date of Birth: 1950-09-15  Today's Date: 08/22/2020 PT Individual Time: 1100-1200 PT Individual Time Calculation (min): 60 min   Short Term Goals: Week 3:  PT Short Term Goal 1 (Week 3): Patient will perform sit to stand transfers with mod A consistently. PT Short Term Goal 2 (Week 3): Patient will perfom bed/chair transfers with mod A consistently. PT Short Term Goal 3 (Week 3): Patient will ambulate 45' with RW and mod A with w/c follow. PT Short Term Goal 4 (Week 3): standing for at least 1 minute for functional ADL with mod A.  Skilled Therapeutic Interventions/Progress Updates:    Patient in supine and reports feeling well.  Applied ace wraps in supine, then pt requesting to toilet and agreeable to try BSC.  Supine to sit mod A and cues.  Patient transferred to [facility] with mod A.  Toileted with max A for hygiene/clothing management.  Patient transferred sit to stand and stand pivot to w/c with mod A with RW.  Patient in w/c assisted to therapy gym for energy conservation.  Sit to stand to RW with mod A.  Ambulated x 6' to mat with mod A with RW and max cues for sequencing, walker proximity, etc.  Patient seated EOM for balance to fold towels x 2, then placing clothes pins on box.  Still with mild R lateral lean, but able to perform activities without UE support with S.  Patient sit to stand to RW and stand step to w/c mod A and mod cues.  Patient assisted in w/c to room.  Sit to stand and stand step to recliner mod A.  Positioned back in chair with help for hips.  Left in recliner with NT in the room and call bell, needs in reach.   Therapy Documentation Precautions:  Precautions Precautions: Fall Precaution Comments: seizure precautions Restrictions Weight Bearing Restrictions: No Other Position/Activity Restrictions: head incision Pain: Pain Assessment Pain Score: 0-No pain   Therapy/Group:  Individual Therapy  Reginia Naas  [place], [place] 08/22/2020, 12:22 PM

## 2020-08-23 DIAGNOSIS — G8918 Other acute postprocedural pain: Secondary | ICD-10-CM | POA: Diagnosis not present

## 2020-08-23 DIAGNOSIS — G479 Sleep disorder, unspecified: Secondary | ICD-10-CM | POA: Diagnosis not present

## 2020-08-23 DIAGNOSIS — S065X9A Traumatic subdural hemorrhage with loss of consciousness of unspecified duration, initial encounter: Secondary | ICD-10-CM | POA: Diagnosis not present

## 2020-08-23 LAB — BASIC METABOLIC PANEL
Anion gap: 11 (ref 5–15)
BUN: 22 mg/dL (ref 8–23)
CO2: 23 mmol/L (ref 22–32)
Calcium: 8.1 mg/dL — ABNORMAL LOW (ref 8.9–10.3)
Chloride: 93 mmol/L — ABNORMAL LOW (ref 98–111)
Creatinine, Ser: 2.79 mg/dL — ABNORMAL HIGH (ref 0.44–1.00)
GFR, Estimated: 18 mL/min — ABNORMAL LOW (ref >60)
Glucose, Bld: 83 mg/dL (ref 70–99)
Potassium: 3.6 mmol/L (ref 3.5–5.1)
Sodium: 127 mmol/L — ABNORMAL LOW (ref 135–145)

## 2020-08-23 NOTE — Progress Notes (Signed)
[facility] PHYSICAL MEDICINE & REHABILITATION PROGRESS NOTE  Subjective/Complaints: No new issues. Slept well. Feels a little chilled but otherwise doing fine this morning  ROS: Patient denies fever, rash, sore throat, blurred vision, nausea, vomiting, diarrhea, cough, shortness of breath or chest pain, joint or back pain, headache, or mood change. .  Objective: Vital Signs: Blood pressure 133/65, pulse 75, temperature 98.4 F (36.9 C), temperature source Oral, resp. rate 18, height 5\' 4"  (1.626 m), weight 47.6 kg, SpO2 100 %. No results found. No results for input(s): WBC, HGB, HCT, PLT in the last 72 hours. Recent Labs    08/22/20 0654 08/23/20 0437  NA 127* 127*  K 3.4* 3.6  CL 93* 93*  CO2 25 23  GLUCOSE 89 83  BUN 23 22  CREATININE 2.87* 2.79*  CALCIUM 8.1* 8.1*    Intake/Output Summary (Last 24 hours) at 08/23/2020 1012 Last data filed at 08/22/2020 2300 Gross per 24 hour  Intake 657 ml  Output --  Net 657 ml        Physical Exam: BP 133/65 (BP Location: Right Arm)   Pulse 75   Temp 98.4 F (36.9 C) (Oral)   Resp 18   Ht 5\' 4"  (1.626 m)   Wt 47.6 kg   SpO2 100%   BMI 18.01 kg/m  Constitutional: No distress . Vital signs reviewed. HEENT: EOMI, oral membranes moist Neck: supple Cardiovascular: RRR without murmur. No JVD    Respiratory/Chest: CTA Bilaterally without wheezes or rales. Normal effort    GI/Abdomen: BS +, non-tender, non-distended Ext: no clubbing, cyanosis, or edema Psych: pleasant and cooperative Skin CDI Musc: No edema in extremities.  No tenderness in extremities. Neuro: Alert Motor: Motor: LUE: 4-4+/5 proximal distal, unchanged RUE: 4/5 proximal distal LLE: Hip flexion 4-/5, knee extension 4-/5, ankle dorsiflexion 4-/5, unchanged RLE: Hip flexion 3+/5, knee extension 3+/5, ankle dorsiflexion 3-3+/5, unchanged   Assessment/Plan: 1. Functional deficits which require 3+ hours per day of interdisciplinary therapy in a comprehensive  inpatient rehab setting.  Physiatrist is providing close team supervision and 24 hour management of active medical problems listed below.  Physiatrist and rehab team continue to assess barriers to discharge/monitor patient progress toward functional and medical goals   Care Tool:  Bathing    Body parts bathed by patient: Right arm,Left arm,Chest,Abdomen,Front perineal area,Face,Right upper leg,Left upper leg,Buttocks,Right lower leg,Left lower leg   Body parts bathed by helper: Buttocks     Bathing assist Assist Level: Minimal Assistance - Patient > 75%     Upper Body Dressing/Undressing Upper body dressing   What is the patient wearing?: Pull over shirt    Upper body assist Assist Level: Supervision/Verbal cueing    Lower Body Dressing/Undressing Lower body dressing      What is the patient wearing?: Pants     Lower body assist Assist for lower body dressing: Moderate Assistance - Patient 50 - 74%     Toileting Toileting    Toileting assist Assist for toileting: Maximal Assistance - Patient 25 - 49%     Transfers Chair/bed transfer  Transfers assist     Chair/bed transfer assist level: Moderate Assistance - Patient 50 - 74% Chair/bed transfer assistive device: Armrests   Locomotion Ambulation   Ambulation assist   Ambulation activity did not occur: Safety/medical concerns  Assist level: Moderate Assistance - Patient 50 - 74% Assistive device: Walker-rolling Max distance: 6'   Walk 10 feet activity   Assist  Walk 10 feet activity did not occur:  Safety/medical concerns  Assist level: 2 helpers Assistive device: Walker-rolling   Walk 50 feet activity   Assist Walk 50 feet with 2 turns activity did not occur: Safety/medical concerns         Walk 150 feet activity   Assist Walk 150 feet activity did not occur: Safety/medical concerns         Walk 10 feet on uneven surface  activity   Assist Walk 10 feet on uneven surfaces  activity did not occur: Safety/medical concerns         Wheelchair     Assist Will patient use wheelchair at discharge?: Yes Type of Wheelchair: Manual    Wheelchair assist level: Supervision/Verbal cueing Max wheelchair distance: 27'    Wheelchair 50 feet with 2 turns activity    Assist    Wheelchair 50 feet with 2 turns activity did not occur: Safety/medical concerns   Assist Level: Supervision/Verbal cueing   Wheelchair 150 feet activity     Assist  Wheelchair 150 feet activity did not occur: Safety/medical concerns        Medical Problem List and Plan: 1.  Right hemiparesis and aphasia secondary to large left SDH s/p craniotomy  -Continue CIR therapies including PT, OT  2.  Impaired mobility -DVT/anticoagulation:  Mechanical: Continue Sequential compression devices, below knee Bilateral lower extremities             -antiplatelet therapy: N/A due to bleed.  3. Chronic migraines/ Pain Management: Continue Fioricet 3-4 times a day on average. Added prn Fioricet q8H  Controlled with meds on 4/23  Monitor with increased exertion 4. Mood: LCSW to follow for evaluation and support.  -antipsychotic agents: N/A 5. Neuropsych: This patient is not fully capable of making decisions on her own behalf. 6. Skin/Wound Care: Monitor wound for healing.             -Family to provide meals from home as able.  -bilateral PRAFOs ordered but patient refuses to wear 7. Fluids/Electrolytes/Nutrition: Continue PD--does not need fluid restriction.              --has had chronic issues with variable intake as well as N/V likely due to gastroparesis.  8. Chronic Hypotension: ContinueTEDs when out of bed             Vitals:   08/23/20 0353 08/23/20 0642  BP: 125/70 133/65  Pulse: 75 75  Resp: 18   Temp: 98.4 F (36.9 C) 98.4 F (36.9 C)  SpO2: 100% 100%   Controlled on 4/23 9.  ESRD on PD: Nephrology to assist/manage PD.  10.  Recurrent Seizures: Has been seizure free,  continue Brivaracetam and Lacosamide bid.              --Lacosamide was decreased on 03/31 to avoid sedative SE.  No breakthrough seizures since admission- 4/23 11. CAD/NSTEMI s/p PCI: Treated medically.   Off DAPT due to bleed   Continue Lipitor.   Metoprolol low-dose resumed  Heart rate controlled on 4/23 12. Acute on chronic anemia:   Hemoglobin 10.0 on 4/18, repeat labs not completed  Continue to monitor 13. H/o Depression: Continue Celexa 14. Sick Euthyroid: Will need follow up as illness resolves.              --TSH- 5.80/Free T4-1.2 (03/27) 15. Gastroparesis w/chronic N/V/anorexia: Has been ongoing per records review.              --question failure of PD (husband reports no N/V with brief  episode of HD in the past)  Continue Reglan 5 3 times daily  Hesitant to continue increase medications due to #18  Nausea better 16.  Hyponatremia             --chronic hyponatremia with Na-130-133 range  Sodium 127 on 4/23             --Continue Sodium bicarb to manage metabolic acidosis. 17.  Severe hypoalbuminemia  Supplement d/ced given nausea and vomiting- recommended nuts as source of protein. Her daughter has brought in.  18.  Prolonged QTC  ECG reviewed, improved since last visit  Compazine and dose/frequency decreased 19.  Sleep disturbance- "never a good sleeper" , on PD a night and gets disturbed by hospital sounds, cont trazodone and melatonin    Reasonably controlled 20.  Hypokalemia  Potassium 3.6 on 4/23  Supplementation per nephrology  LOS: 19 days A FACE TO [facility] 08/23/2020, 10:12 AM

## 2020-08-23 NOTE — Progress Notes (Signed)
Subjective: No current complaints said tolerated PD last night  Objective Vital signs in last 24 hours: Vitals:   08/22/20 1938 08/23/20 0353 08/23/20 0446 08/23/20 0642  BP: 118/70 125/70  133/65  Pulse: 84 75  75  Resp: 18 18    Temp: 98.2 F (36.8 C) 98.4 F (36.9 C)  98.4 F (36.9 C)  TempSrc: Oral Oral  Oral  SpO2: 99% 100%  100%  Weight:   48.6 kg 47.6 kg  Height:       Weight change: -1.7 kg  Physical Exam: General: Alert thin chronically ill female NAD pleasant Heart: RRR, no MRG Lungs: CTA, nonlabored breathing Abdomen: Bowel sounds normoactive, soft NTND no ascites, PD catheter present Extremities: No pedal edema Dialysis Access: PD catheter exit site clean dry  Dialysis Orders:  [place] followed by Dr. Candiss Norse ([place]). 4 exchanges overnight, 2000 cc and 1.5 hrs each.   Problem/Plan:  1. ESRD-ONCCPDin [place], [place], f/b Dr Candiss Norse of [place].Cont PD nightly4x 2000cc. Blood effluent seen on takedown 4/15 but has not had any moresince.PD gram stain, cultures and cell count not consistent with infection.  2.Hypokalemia:Mild, K+ 3.4.  Yesterday 3.6 this a.m.= given another dose of PO potassium chloride 67mEq x1 yesterday 3.Vol overload-resolved/HTN-on admit sig LE dependent edema and UE edemaon admission.Volume status resolved since admission with PD , euvolemic on exam.Continues to have good UF with all 1.5% exchanges, will continue. On low-dose p.o. Lopressor 6.25 mg twice daily 4. Debility: 2/2 Multiple med issues, most recently SDH recovery and nstemi w/ CAD and low EF. Readmitted to CIR on 08/04/20. 5.Fall w/ subdural hematoma- sp SDH evacuation at [facility] on3/24/22 6.Anemia of ESRD Hgb 10.0 on Aranesp 60 MCG q. Tuesday subcut. 7. MBD= phosphorus 3.6 without binder corrected calcium 10.5, no vitamin D 8. Nausea/ vomiting-now resolved etiology unclear, uremiaunlikely.PD cell count in the past and culture unrevealing,  Preliminary report of body fluid culture shows ngtdand effluent has been clear. 9. Malnutrition/ debility-albumin 1.0 verylow albumin. It has been d/w husband on several occassions in the past about a possible trial of HD due to low albumin in a PD patient. He had stated in the past they are not interested and feels she would not tolerate HD. 10.Multivessel CAD- with reduced EF 25-30% by recent cath in Feb 2022. Per TCTSwasnotCABG candidate. UnderwentPCI to LAD. At [facility] repeat echo per family showed improved EF 55%. 11. Chronichyponatremia-a.m. NA 127,suspect this is a PO intake issue/not related to volume+ is euvolemic StartedPO NaCl BID, trend sodium levels.  12. Metabolic acidosis-CO2 at goal, not on sodium bicarbonate supplementation . 14.H/oSeizures-per primary team  Ernest Haber, PA-C [facility] [phone] 08/23/2020,12:30 PM  LOS: 19 days   Labs: Basic Metabolic Panel: Recent Labs  Lab 08/21/20 0510 08/22/20 0654 08/23/20 0437  NA 128* 127* 127*  K 3.2* 3.4* 3.6  CL 94* 93* 93*  CO2 22 25 23   GLUCOSE 83 89 83  BUN 25* 23 22  CREATININE 2.90* 2.87* 2.79*  CALCIUM 8.2* 8.1* 8.1*   Liver Function Tests: No results for input(s): AST, ALT, ALKPHOS, BILITOT, PROT, ALBUMIN in the last 168 hours. No results for input(s): LIPASE, AMYLASE in the last 168 hours. No results for input(s): AMMONIA in the last 168 hours. CBC: Recent Labs  Lab 08/18/20 0612  WBC 7.7  HGB 10.0*  HCT 30.2*  MCV 98.7  PLT 353   Cardiac Enzymes: No results for input(s): CKTOTAL, CKMB, CKMBINDEX, TROPONINI in the last 168 hours.  CBG: No results for input(s): GLUCAP in the last 168 hours.  Studies/Results: No results found. Medications:  . aspirin EC  81 mg Oral Daily  . atorvastatin  40 mg Oral Daily  . brivaracetam  75 mg Oral BID  . butalbital-acetaminophen-caffeine  1 tablet Oral TID with meals  . citalopram  10 mg Oral Daily  . darbepoetin  (ARANESP) injection - NON-DIALYSIS  60 mcg Subcutaneous Q Tue-1800  . docusate sodium  100 mg Oral BID  . gentamicin cream  1 application Topical Daily  . heparin  5,000 Units Subcutaneous Q12H  . kidney failure book   Does not apply Once  . lacosamide  75 mg Oral BID  . melatonin  5 mg Oral QHS  . metoCLOPramide  5 mg Oral TID AC  . metoprolol tartrate  6.25 mg Oral BID  . multivitamin  1 tablet Oral QHS  . pantoprazole  40 mg Oral Daily  . sodium chloride  1 g Oral BID WC  . triamcinolone cream   Topical TID

## 2020-08-23 NOTE — Progress Notes (Signed)
Speech Language Pathology TBI Note  Patient Details  Name: Amy [name] MRN: [id number] Date of Birth: 12/19/1950  Today's Date: 08/23/2020 SLP Individual Time: 1624-4695 SLP Individual Time Calculation (min): 42 min  Short Term Goals: Week 3: SLP Short Term Goal 1 (Week 3): Patient will utilize external memory aids to recall new, daily information with supervision A verbal cues. SLP Short Term Goal 2 (Week 3): Pt will demonstrate sustained attention in functional tasks in 30 minue intervals with supervision A verbal cues for redirection. SLP Short Term Goal 3 (Week 3): Pt will demonstrate mildly complex problem solving skills in functional tasks with min A verbal cues. SLP Short Term Goal 4 (Week 3): Pt will demonstrate self-monitor and self-correction in functional errors with min A verbal cues.  Skilled Therapeutic Interventions: Pt seen for skilled ST with focus on cognitive goals. Pt participating in portions of the ALFA. "Telling Time" - 100%, "Solving Daily Math Problems" - 60% accuracy. With mod A verbal cues, repetition and extra time, patient able to provide correct response to errored basic math problems. Pt needing ~25 minutes to complete 10 simple math problems, stating "my brain just won't work". Educated on compensatory strategies to reduce frustration during difficult tasks. SLP and pt filling out memory notebook to increase temporal orientation and carryover of information. Pt left in bed with alarm set and all needs within reach. Cont ST POC.   Pain Pain Assessment Pain Scale: 0-10 Pain Score: 0-No pain  Agitated Behavior Scale: TBI Observation Details Observation Environment: pt room Start of observation period - Date: 08/23/20 Start of observation period - Time: 1030 End of observation period - Date: 08/23/20 End of observation period - Time: 1115 Agitated Behavior Scale (DO NOT LEAVE BLANKS) Short attention span, easy distractibility, inability to concentrate:  Present to a slight degree Impulsive, impatient, low tolerance for pain or frustration: Absent Uncooperative, resistant to care, demanding: Absent Violent and/or threatening violence toward people or property: Absent Explosive and/or unpredictable anger: Absent Rocking, rubbing, moaning, or other self-stimulating behavior: Absent Pulling at tubes, restraints, etc.: Absent Wandering from treatment areas: Absent Restlessness, pacing, excessive movement: Absent Repetitive behaviors, motor, and/or verbal: Absent Rapid, loud, or excessive talking: Absent Sudden changes of mood: Absent Easily initiated or excessive crying and/or laughter: Absent Self-abusiveness, physical and/or verbal: Absent Agitated behavior scale total score: 15  Therapy/Group: Individual Therapy  Dewaine Conger 08/23/2020, 11:01 AM

## 2020-08-24 LAB — BASIC METABOLIC PANEL
Anion gap: 11 (ref 5–15)
BUN: 22 mg/dL (ref 8–23)
CO2: 20 mmol/L — ABNORMAL LOW (ref 22–32)
Calcium: 8.1 mg/dL — ABNORMAL LOW (ref 8.9–10.3)
Chloride: 95 mmol/L — ABNORMAL LOW (ref 98–111)
Creatinine, Ser: 2.66 mg/dL — ABNORMAL HIGH (ref 0.44–1.00)
GFR, Estimated: 19 mL/min — ABNORMAL LOW (ref >60)
Glucose, Bld: 80 mg/dL (ref 70–99)
Potassium: 4.9 mmol/L (ref 3.5–5.1)
Sodium: 126 mmol/L — ABNORMAL LOW (ref 135–145)

## 2020-08-24 NOTE — Progress Notes (Signed)
Subjective:  Tolerated HD last pm , no issues with pd , no sob or cp.  Objective Vital signs in last 24 hours: Vitals:   08/23/20 1534 08/23/20 2003 08/24/20 0407 08/24/20 0710  BP: 132/71 124/77 120/75 117/75  Pulse: 77 87 75 75  Resp: 16 18 18 16   Temp: 98.8 F (37.1 C) 98.5 F (36.9 C) 98.4 F (36.9 C) 98.6 F (37 C)  TempSrc: Oral   Oral  SpO2: 100% 100% 100% 100%  Weight:   46.3 kg 46.3 kg  Height:       Weight change: 0.6 kg  Physical Exam: General: Alert thin chronically ill female NAD pleasant Heart: RRR, no MRG Lungs: CTA, nonlabored breathing Abdomen: Bowel sounds normoactive, soft NTND no ascites, PD catheter present Extremities: No pedal edema Dialysis Access: PD catheter exit site clean dry  Dialysis Orders:  [place] [facility] followed by Dr. Candiss Norse ([place]). 4 exchanges overnight, 2000 cc and 1.5 hrs each.   Problem/Plan:  1. ESRD-ONCCPDin [place], [place], f/b Dr Candiss Norse of [place].Cont PD nightly4x 2000cc. Blood effluent seen on takedown 4/15 but has not had any moresince.PD gram stain, cultures and cell count not consistent with infection.  2.Hypokalemia:resolved , K+ 4.9.   3.Vol overload-resolved/HTN-on admit sig LE dependent edema and UE edemaon admission.Volume status resolved since admission with PD , euvolemic on exam.Continues to have good UF with all 1.5% exchanges, will continue. On low-dose p.o. Lopressor 6.25 mg twice daily 4. Debility: 2/2 Multiple med issues, most recently SDH recovery and nstemi w/ CAD and low EF. Readmitted to CIR on 08/04/20. 5.Fall w/ subdural hematoma- sp SDH evacuation at [facility] on3/24/22 6.Anemia of ESRD Hgb 10.0 on Aranesp 60 MCG q. Tuesday subcut. 7. MBD= phosphorus 3.6 without binder corrected calcium 10.5, no vitamin D 8. Nausea/ vomiting-now resolved etiology unclear, uremiaunlikely.PD cell count in the past and culture unrevealing, Preliminary report of body fluid culture shows ngtdand  effluent has been clear. 9. Malnutrition/ debility-albumin 1.0 verylow albumin. It has been d/w husband on several occassions in the past about a possible trial of HD due to low albumin in a PD patient. He had stated in the past they are not interested and feels she would not tolerate HD.said would try Nepro supplement  10.Multivessel CAD- with reduced EF 25-30% by recent cath in Feb 2022. Per TCTSwasnotCABG candidate. UnderwentPCI to LAD. At [facility] repeat echo per family showed improved EF 55%. 11. Chronichyponatremia-a.m. NA 126,suspect this is a PO intake issue/not related to volume+ is euvolemic StartedPO NaCl BID, trend sodium levels. 12. Metabolic [id number] 20 this am  FU  tend ,not on sodium bicarbonate yet 14.H/oSeizures-per primary team  Ernest Haber, PA-C [facility] Beeper [phone] 08/24/2020,12:29 PM  LOS: 20 days   Labs: Basic Metabolic Panel: Recent Labs  Lab 08/22/20 0654 08/23/20 0437 08/24/20 0423  NA 127* 127* 126*  K 3.4* 3.6 4.9  CL 93* 93* 95*  CO2 25 23 20*  GLUCOSE 89 83 80  BUN 23 22 22   CREATININE 2.87* 2.79* 2.66*  CALCIUM 8.1* 8.1* 8.1*   Liver Function Tests: No results for input(s): AST, ALT, ALKPHOS, BILITOT, PROT, ALBUMIN in the last 168 hours. No results for input(s): LIPASE, AMYLASE in the last 168 hours. No results for input(s): AMMONIA in the last 168 hours. CBC: Recent Labs  Lab 08/18/20 0612  WBC 7.7  HGB 10.0*  HCT 30.2*  MCV 98.7  PLT 353   Cardiac Enzymes: No results for input(s): CKTOTAL, CKMB, CKMBINDEX,  TROPONINI in the last 168 hours. CBG: No results for input(s): GLUCAP in the last 168 hours.  Studies/Results: No results found. Medications:  . aspirin EC  81 mg Oral Daily  . atorvastatin  40 mg Oral Daily  . brivaracetam  75 mg Oral BID  . butalbital-acetaminophen-caffeine  1 tablet Oral TID with meals  . citalopram  10 mg Oral Daily  . darbepoetin (ARANESP) injection -  NON-DIALYSIS  60 mcg Subcutaneous Q Tue-1800  . docusate sodium  100 mg Oral BID  . gentamicin cream  1 application Topical Daily  . heparin  5,000 Units Subcutaneous Q12H  . kidney failure book   Does not apply Once  . lacosamide  75 mg Oral BID  . melatonin  5 mg Oral QHS  . metoCLOPramide  5 mg Oral TID AC  . metoprolol tartrate  6.25 mg Oral BID  . multivitamin  1 tablet Oral QHS  . pantoprazole  40 mg Oral Daily  . sodium chloride  1 g Oral BID WC  . triamcinolone cream   Topical TID

## 2020-08-24 NOTE — Progress Notes (Signed)
Occupational Therapy Session Note  Patient Details  Name: Amy [name] MRN: [id number] Date of Birth: 06/09/1950  Today's Date: 08/24/2020 OT Individual Time: 1435-1530 OT Individual Time Calculation (min): 55 min   Skilled Therapeutic Interventions/Progress Updates:    Pt greeted in bed with c/o HA discomfort. Per RN, pt already medicated for pain. OT provided pt with lavender aromatherapy to address holistically. She declined bathing/dressing, was agreeable to try toileting at first but adamant that she wanted to use the bedpan vs toilet. Pt reported that using the toilet was different at the hospital than at home. She refused to attempt OOB toileting though was encouraged to do so. Pt ultimately refusing all OOB/EOB activity, requesting for bedlevel exercises. Therefore guided her through UE exercises using the yellow tband/2# bar x10 reps each exercise, min facilitation at times for the Lt UE due to shoulder weakness. Cues also for sustained attention to task. She also completed gentle cervical, shoulder, and wrist stretches with verbal and visual cues. Transitioned to core strengthening with anterior reaches to target outside of base of support while she was semi reclined in bed. Pt required encouragement to participate at max level of independence. At end of session pt remained comfortably in bed, all needs within reach and bed alarm set.   Therapy Documentation Precautions:  Precautions Precautions: Fall Precaution Comments: seizure precautions Restrictions Weight Bearing Restrictions: No Other Position/Activity Restrictions: head incision Vital Signs: Therapy Vitals Temp: 98.5 F (36.9 C) Temp Source: Oral Pulse Rate: 86 Resp: 17 BP: 128/69 Patient Position (if appropriate): Lying Oxygen Therapy SpO2: 98 % O2 Device: Room Air ADL: ADL Eating: Supervision/safety,Set up Where Assessed-Eating: Bed level Grooming: Minimal assistance Where Assessed-Grooming: Bed level Upper  Body Bathing: Moderate assistance Where Assessed-Upper Body Bathing: Bed level Lower Body Bathing: Dependent Where Assessed-Lower Body Bathing: Bed level Upper Body Dressing: Maximal assistance Where Assessed-Upper Body Dressing: Bed level Lower Body Dressing: Dependent Where Assessed-Lower Body Dressing: Bed level Toileting: Dependent Where Assessed-Toileting: Bed level      Therapy/Group: Individual Therapy  [name] A Max Romano 08/24/2020, 4:09 PM

## 2020-08-25 DIAGNOSIS — S065X9A Traumatic subdural hemorrhage with loss of consciousness of unspecified duration, initial encounter: Secondary | ICD-10-CM | POA: Diagnosis not present

## 2020-08-25 DIAGNOSIS — G8918 Other acute postprocedural pain: Secondary | ICD-10-CM | POA: Diagnosis not present

## 2020-08-25 DIAGNOSIS — D649 Anemia, unspecified: Secondary | ICD-10-CM | POA: Diagnosis not present

## 2020-08-25 DIAGNOSIS — G479 Sleep disorder, unspecified: Secondary | ICD-10-CM | POA: Diagnosis not present

## 2020-08-25 LAB — RENAL FUNCTION PANEL
Albumin: 1.3 g/dL — ABNORMAL LOW (ref 3.5–5.0)
Anion gap: 13 (ref 5–15)
BUN: 22 mg/dL (ref 8–23)
CO2: 24 mmol/L (ref 22–32)
Calcium: 8.5 mg/dL — ABNORMAL LOW (ref 8.9–10.3)
Chloride: 90 mmol/L — ABNORMAL LOW (ref 98–111)
Creatinine, Ser: 3.1 mg/dL — ABNORMAL HIGH (ref 0.44–1.00)
GFR, Estimated: 15 mL/min — ABNORMAL LOW (ref >60)
Glucose, Bld: 108 mg/dL — ABNORMAL HIGH (ref 70–99)
Phosphorus: 3.9 mg/dL (ref 2.5–4.6)
Potassium: 3.7 mmol/L (ref 3.5–5.1)
Sodium: 127 mmol/L — ABNORMAL LOW (ref 135–145)

## 2020-08-25 LAB — BASIC METABOLIC PANEL
Anion gap: 8 (ref 5–15)
BUN: 19 mg/dL (ref 8–23)
CO2: 25 mmol/L (ref 22–32)
Calcium: 8.3 mg/dL — ABNORMAL LOW (ref 8.9–10.3)
Chloride: 94 mmol/L — ABNORMAL LOW (ref 98–111)
Creatinine, Ser: 2.87 mg/dL — ABNORMAL HIGH (ref 0.44–1.00)
GFR, Estimated: 17 mL/min — ABNORMAL LOW (ref >60)
Glucose, Bld: 94 mg/dL (ref 70–99)
Potassium: 3.3 mmol/L — ABNORMAL LOW (ref 3.5–5.1)
Sodium: 127 mmol/L — ABNORMAL LOW (ref 135–145)

## 2020-08-25 LAB — CBC
HCT: 30.8 % — ABNORMAL LOW (ref 36.0–46.0)
Hemoglobin: 9.9 g/dL — ABNORMAL LOW (ref 12.0–15.0)
MCH: 31.5 pg (ref 26.0–34.0)
MCHC: 32.1 g/dL (ref 30.0–36.0)
MCV: 98.1 fL (ref 80.0–100.0)
Platelets: 265 10*3/uL (ref 150–400)
RBC: 3.14 MIL/uL — ABNORMAL LOW (ref 3.87–5.11)
RDW: 17.2 % — ABNORMAL HIGH (ref 11.5–15.5)
WBC: 5.7 10*3/uL (ref 4.0–10.5)
nRBC: 0 % (ref 0.0–0.2)

## 2020-08-25 MED ORDER — POTASSIUM CHLORIDE CRYS ER 20 MEQ PO TBCR
20.0000 meq | EXTENDED_RELEASE_TABLET | Freq: Once | ORAL | Status: AC
  Administered 2020-08-25: 20 meq via ORAL
  Filled 2020-08-25: qty 1

## 2020-08-25 NOTE — Progress Notes (Signed)
Occupational Therapy Session Note  Patient Details  Name: Amy [name] MRN: [id number] Date of Birth: [birth year]  Today's Date: 08/25/2020 OT Individual Time: 1000-1100 OT Individual Time Calculation (min): 60 min    Short Term Goals: Week 3:  OT Short Term Goal 1 (Week 3): patient will complete toileting with mod A OT Short Term Goal 2 (Week 3): patient will complete bed mobility and SPT with min A OT Short Term Goal 3 (Week 3): patient will complete LB bathing/dressing with min a  Skilled Therapeutic Interventions/Progress Updates:    Patient in bed, alert, states that she has a mild HA.  Assisted with donning bilateral ace wraps and sipper socks.  Supine to sitting edge of bed max A.  Sit to stand using stedy from bed, toilet and w/c surfaces max A.  Continent of bowel and bladder on toilet.  Dependent to donn clean brief and completed hygiene and clothing management.  Donned pants w/c level max A, sit to stand at bed rail max A and dependent to pull pants over hips.  Oral care with set up/supervision, able to wash face and upper body with set up/CS.  OH shirt min A.   With PT for 2nd half of session - Completed short distance ambulation with RW x2 with max A of one with min A of 2nd, step up/down on 4 inch riser with assist of 2.  Hand off to PT for ongoing treatment session.    Therapy Documentation Precautions:  Precautions Precautions: Fall Precaution Comments: seizure precautions Restrictions Weight Bearing Restrictions: No Other Position/Activity Restrictions: head incision   Therapy/Group: Individual Therapy  Carlos Levering 08/25/2020, 7:30 AM

## 2020-08-25 NOTE — Progress Notes (Signed)
Speech Language Pathology Daily Session Note  Patient Details  Name: RANIKA MCNIEL MRN: [id number] Date of Birth: 04/24/1951  Today's Date: 08/25/2020 SLP Individual Time: 0145-0215 SLP Individual Time Calculation (min): 30 min  Short Term Goals: Week 3: SLP Short Term Goal 1 (Week 3): Patient will utilize external memory aids to recall new, daily information with supervision A verbal cues. SLP Short Term Goal 2 (Week 3): Pt will demonstrate sustained attention in functional tasks in 30 minue intervals with supervision A verbal cues for redirection. SLP Short Term Goal 3 (Week 3): Pt will demonstrate mildly complex problem solving skills in functional tasks with min A verbal cues. SLP Short Term Goal 4 (Week 3): Pt will demonstrate self-monitor and self-correction in functional errors with min A verbal cues.  Skilled Therapeutic Interventions:   Skilled SLP intervention focused on cognition. Pt completed working memory task and named items in simple categories beginning with letters A-Z with mod-max A verbal cues. She required descriptive details to increase naming for items that began with a specific letter. She often lost track of thoughts and needed information repeated or began attempting to answer previous questions asked. She required increased time to read today's rehab schedule but understood this written information adequately to answer yes/no questions. Cont with therapy per plan of care.   Pain Pain Assessment Pain Scale: Faces Faces Pain Scale: No hurt  Therapy/Group: Individual Therapy  Darrol Poke Leonardo Plaia 08/25/2020, 2:12 PM

## 2020-08-25 NOTE — Progress Notes (Signed)
Physical Therapy Session Note  Patient Details  Name: Amy [name] MRN: [id number] Date of Birth: Feb 23, 1951  Today's Date: 08/25/2020 PT Individual Time:Session1: 1100-1200; Session2: 1430-1500 PT Individual Time Calculation (min): 60 min & 30 min  Short Term Goals: Week 3:  PT Short Term Goal 1 (Week 3): Patient will perform sit to stand transfers with mod A consistently. PT Short Term Goal 2 (Week 3): Patient will perfom bed/chair transfers with mod A consistently. PT Short Term Goal 3 (Week 3): Patient will ambulate 24' with RW and mod A with w/c follow. PT Short Term Goal 4 (Week 3): standing for at least 1 minute for functional ADL with mod A.  Skilled Therapeutic Interventions/Progress Updates:   Session1:  Patient seen initially in co-treat with OT, then in individual session with PT.  Patient on Nu Step following ambulation with OT/PT.  Performed UE/LE x 2 minutes x 2 at level 1.  Patient stand step to w/c with mod A, but had to bring w/c to pt due to LE's fatigued and weak.  Propelled w/c x 40' with S and increased time.  Patient performed stand step to recliner with RW and mod A.  In chair performed LAQ x 10, hip adductor squeezes x 10 w/ 3 sec hold, heel slides (A for R LE), hip abduction (A for both LE's), ankle pumps x 20 and trunk flexion from sitting with arms crossed over chest x 10.  Patient needing mod cues to recall events of both OT and PT sessions with PT recording in memory book.  Performed seated UE therex with yelllow t-band  including horizontal shoulder abduction x 10, rows with band around feet x 10, bicep curls and tricep extensions x 10 each.  Patient left in recliner with call bell in reach and lab technician in the room.   Session 2:  Patient in recliner assisted via squat pivot to w/c mod A.  Pushed in w/c outside to ground level entrance and sitting in sun to view flowers, cars on [place] and for pt's mental health.  Patient assisted to room and performed sit to stand  to RW mod A.  Ambulated 5' to bed with mod to max A pt with LOB on turn needing lowering help to bed.  Sit to supine with mod A for LE's.  Removed ace wraps and pt placed in chair position in bed.  Left with call bell in reach and bed alarm set.   Therapy Documentation Precautions:  Precautions Precautions: Fall Precaution Comments: seizure precautions Restrictions Weight Bearing Restrictions: No Other Position/Activity Restrictions: head incision Pain: Pain Assessment Pain Scale: Faces Pain Score: 0-No pain Faces Pain Scale: No hurt    Therapy/Group: Individual Therapy  Reginia Naas  [place]vlin, PT 08/25/2020, 5:15 PM

## 2020-08-25 NOTE — Progress Notes (Signed)
[place] PHYSICAL MEDICINE & REHABILITATION PROGRESS NOTE  Subjective/Complaints: Patient seen laying in bed this morning.  She states she slept well overnight.  She states he had good weekend.  She was seen by nephrology yesterday, notes reviewed- initiating Nepro supplement.  ROS: Denies CP, SOB, N/V/D  Objective: Vital Signs: Blood pressure 133/74, pulse 80, temperature 99.2 F (37.3 C), temperature source Oral, resp. rate 16, height 5\' 4"  (1.626 m), weight 45.2 kg, SpO2 98 %. No results found. Recent Labs    08/25/20 0448  WBC 5.7  HGB 9.9*  HCT 30.8*  PLT 265   Recent Labs    08/24/20 0423 08/25/20 0448  NA 126* 127*  K 4.9 3.3*  CL 95* 94*  CO2 20* 25  GLUCOSE 80 94  BUN 22 19  CREATININE 2.66* 2.87*  CALCIUM 8.1* 8.3*    Intake/Output Summary (Last 24 hours) at 08/25/2020 1233 Last data filed at 08/24/2020 1859 Gross per 24 hour  Intake 240 ml  Output --  Net 240 ml        Physical Exam: BP 133/74 (BP Location: Left Arm)   Pulse 80   Temp 99.2 F (37.3 C) (Oral)   Resp 16   Ht 5\' 4"  (1.626 m)   Wt 45.2 kg   SpO2 98%   BMI 17.10 kg/m  Constitutional: No distress . Vital signs reviewed. HENT: Normocephalic.  Atraumatic. Eyes: EOMI. No discharge. Cardiovascular: No JVD.   Respiratory: Normal effort.  No stridor.   GI: Non-distended.  + PD site. Skin: Warm and dry.  Intact. Psych: Normal mood.  Normal behavior. Musc: No edema in extremities.  No tenderness in extremities. Neuro: Alert Motor: Motor: LUE: 4-4+/5 proximal distal, stable RUE: 4/5 proximal distal LLE: Hip flexion 4-/5, knee extension 4-/5, ankle dorsiflexion 4-/5, stable RLE: Hip flexion 3+/5, knee extension 3+/5, ankle dorsiflexion 3-3+/5, unchanged   Assessment/Plan: 1. Functional deficits which require 3+ hours per day of interdisciplinary therapy in a comprehensive inpatient rehab setting.  Physiatrist is providing close team supervision and 24 hour management of active  medical problems listed below.  Physiatrist and rehab team continue to assess barriers to discharge/monitor patient progress toward functional and medical goals   Care Tool:  Bathing    Body parts bathed by patient: Right arm,Left arm,Chest,Abdomen,Front perineal area,Face,Right upper leg,Left upper leg,Buttocks,Right lower leg,Left lower leg   Body parts bathed by helper: Buttocks     Bathing assist Assist Level: Minimal Assistance - Patient > 75%     Upper Body Dressing/Undressing Upper body dressing   What is the patient wearing?: Pull over shirt    Upper body assist Assist Level: Minimal Assistance - Patient > 75%    Lower Body Dressing/Undressing Lower body dressing      What is the patient wearing?: Pants     Lower body assist Assist for lower body dressing: Maximal Assistance - Patient 25 - 49%     Toileting Toileting    Toileting assist Assist for toileting: Total Assistance - Patient < 25%     Transfers Chair/bed transfer  Transfers assist     Chair/bed transfer assist level: Moderate Assistance - Patient 50 - 74% Chair/bed transfer assistive device: Armrests   Locomotion Ambulation   Ambulation assist   Ambulation activity did not occur: Safety/medical concerns  Assist level: Moderate Assistance - Patient 50 - 74% Assistive device: Walker-rolling Max distance: 6'   Walk 10 feet activity   Assist  Walk 10 feet activity did not occur:  Safety/medical concerns  Assist level: 2 helpers Assistive device: Walker-rolling   Walk 50 feet activity   Assist Walk 50 feet with 2 turns activity did not occur: Safety/medical concerns         Walk 150 feet activity   Assist Walk 150 feet activity did not occur: Safety/medical concerns         Walk 10 feet on uneven surface  activity   Assist Walk 10 feet on uneven surfaces activity did not occur: Safety/medical concerns         Wheelchair     Assist Will patient use  wheelchair at discharge?: Yes Type of Wheelchair: Manual    Wheelchair assist level: Supervision/Verbal cueing Max wheelchair distance: 27'    Wheelchair 50 feet with 2 turns activity    Assist    Wheelchair 50 feet with 2 turns activity did not occur: Safety/medical concerns   Assist Level: Supervision/Verbal cueing   Wheelchair 150 feet activity     Assist  Wheelchair 150 feet activity did not occur: Safety/medical concerns        Medical Problem List and Plan: 1.  Right hemiparesis and aphasia secondary to large left SDH s/p craniotomy  Continue CIR 2.  Impaired mobility -DVT/anticoagulation:  Mechanical: Continue Sequential compression devices, below knee Bilateral lower extremities             -antiplatelet therapy: N/A due to bleed.  3. Chronic migraines/ Pain Management: Continue Fioricet 3-4 times a day on average. Added prn Fioricet q8H  Controlled with meds on 4/25  Monitor with increased exertion 4. Mood: LCSW to follow for evaluation and support.  -antipsychotic agents: N/A 5. Neuropsych: This patient is not fully capable of making decisions on her own behalf. 6. Skin/Wound Care: Monitor wound for healing.             -Family to provide meals from home as able.  -bilateral PRAFOs ordered but patient refuses to wear 7. Fluids/Electrolytes/Nutrition: Continue PD--does not need fluid restriction.              --has had chronic issues with variable intake as well as N/V likely due to gastroparesis.  8. Chronic Hypotension: ContinueTEDs when out of bed             Vitals:   08/25/20 0440 08/25/20 0647  BP: 128/80 133/74  Pulse: 78 80  Resp: 17 16  Temp: 98 F (36.7 C) 99.2 F (37.3 C)  SpO2: 97% 98%   Controlled on 4/25 9.  ESRD on PD: Nephrology to assist/manage PD.  10.  Recurrent Seizures: Has been seizure free, continue Brivaracetam and Lacosamide bid.              --Lacosamide was decreased on 03/31 to avoid sedative SE.  No breakthrough  seizures since admission- 4/25 11. CAD/NSTEMI s/p PCI: Treated medically.   Off DAPT due to bleed   Continue Lipitor.   Metoprolol low-dose resumed  Heart rate controlled on 4/25 12. Acute on chronic anemia:   Hemoglobin 9.9 on 4/25  Continue to monitor 13. H/o Depression: Continue Celexa 14. Sick Euthyroid: Will need follow up as illness resolves.              --TSH- 5.80/Free T4-1.2 (03/27) 15. Gastroparesis w/chronic N/V/anorexia: Has been ongoing per records review.              --question failure of PD (husband reports no N/V with brief episode of HD in the past)  Continue Reglan  5 3 times daily  Hesitant to continue increase medications due to #18  Nausea improved 16.  Hyponatremia             --chronic hyponatremia with Na-130-133 range  Sodium 127 on 4/23             --Continue Sodium bicarb to manage metabolic acidosis. 17.  Severe hypoalbuminemia  Supplement d/ced given nausea and vomiting- recommended nuts as source of protein. Her daughter has brought in.  18.  Prolonged QTC  ECG reviewed, improved since last visit  Compazine and dose/frequency decreased 19.  Sleep disturbance- "never a good sleeper" , on PD a night and gets disturbed by hospital sounds, cont trazodone and melatonin    Reasonably controlled 20.  Hypokalemia  Potassium 3.3 on 4/25  Supplementation per nephrology  LOS: 21 days A FACE TO FACE EVALUATION WAS PERFORMED  Amy [name] Amy [name] 08/25/2020, 12:33 PM

## 2020-08-25 NOTE — Progress Notes (Signed)
Subjective:  Tolerated PD last pm.  No c/o abd pain, dyspnea, edema.  Working with PT currently.   Objective Vital signs in last 24 hours: Vitals:   08/24/20 1321 08/24/20 2016 08/25/20 0440 08/25/20 0647  BP: 128/69 130/72 128/80 133/74  Pulse: 86 78 78 80  Resp: 17 16 17 16   Temp: 98.5 F (36.9 C) 98.6 F (37 C) 98 F (36.7 C) 99.2 F (37.3 C)  TempSrc: Oral   Oral  SpO2: 98% 98% 97% 98%  Weight:    45.2 kg  Height:       Weight change: 0 kg  Physical Exam: General: Alert thin chronically ill female NAD pleasant Heart: RRR, no MRG Lungs:  nonlabored breathing Abdomen: Bowel sounds normoactive, soft NTND no ascites, PD catheter present Extremities: No pedal edema Dialysis Access: PD catheter exit site clean dry  Dialysis Orders:  [place] followed by Dr. Candiss Norse ([place]). 4 exchanges overnight, 2000 cc and 1.5 hrs each.   Problem/Plan:  1. ESRD-ONCCPDin [place], [place], f/b Dr Candiss Norse of [place].Cont PD nightly4x 2000cc. Blood effluent seen on takedown 4/15 but has not had any moresince.PD gram stain, cultures and cell count not consistent with infection.  2.Hypokalemia:intermittent, repleting PRN 3.Vol overload-resolved/HTN-on admit sig LE dependent edema and UE edemaon admission.Volume status resolved since admission with PD , euvolemic on exam.Continues to have good UF with all 1.5% exchanges, will continue. On low-dose p.o. Lopressor 6.25 mg twice daily 4. Debility: 2/2 Multiple med issues, most recently SDH recovery and NSTEMI w/ CAD and low EF. Readmitted to CIR on 08/04/20. 5.Fall w/ subdural hematoma- sp SDH evacuation at [facility] on3/24/22 6.Anemia of ESRD Hgb 9.9 on Aranesp 60 MCG q. Tuesday subcut. 7. MBD= phosphorus 3.6 without binder; corrected calcium mid 10s, no vitamin D 8. Nausea/ vomiting-now resolved etiology unclear, uremiaunlikely.PD cell count in the past and culture unrevealing, Preliminary report of body fluid  culture shows ngtdand effluent has been clear. 9. Malnutrition/ debility-albumin 1.0 verylow albumin. It has been d/w husband on several occassions in the past about a possible trial of HD due to low albumin in a PD patient. He had stated in the past they are not interested and feels she would not tolerate HD. Nepro supplement  10.Multivessel CAD- with reduced EF 25-30% by recent cath in Feb 2022. Per TCTSwasnotCABG candidate. UnderwentPCI to LAD. At [facility] repeat echo per family showed improved EF 55%. 11. Chronichyponatremia-stable in high 120s, appears euvolemic.  D/C NaCl tabs for now.  12. Metabolic acidosis-bicarb 25 this AM. No need for po bicarb.  14.H/oSeizures-per primary team  [name] MD [facility] Pager [phone]  Labs: Basic Metabolic Panel: Recent Labs  Lab 08/23/20 0437 08/24/20 0423 08/25/20 0448  NA 127* 126* 127*  K 3.6 4.9 3.3*  CL 93* 95* 94*  CO2 23 20* 25  GLUCOSE 83 80 94  BUN 22 22 19   CREATININE 2.79* 2.66* 2.87*  CALCIUM 8.1* 8.1* 8.3*   Liver Function Tests: No results for input(s): AST, ALT, ALKPHOS, BILITOT, PROT, ALBUMIN in the last 168 hours. No results for input(s): LIPASE, AMYLASE in the last 168 hours. No results for input(s): AMMONIA in the last 168 hours. CBC: Recent Labs  Lab 08/25/20 0448  WBC 5.7  HGB 9.9*  HCT 30.8*  MCV 98.1  PLT 265   Cardiac Enzymes: No results for input(s): CKTOTAL, CKMB, CKMBINDEX, TROPONINI in the last 168 hours. CBG: No results for input(s): GLUCAP in the last 168 hours.  Studies/Results:  No results found. Medications:  . aspirin EC  81 mg Oral Daily  . atorvastatin  40 mg Oral Daily  . brivaracetam  75 mg Oral BID  . butalbital-acetaminophen-caffeine  1 tablet Oral TID with meals  . citalopram  10 mg Oral Daily  . darbepoetin (ARANESP) injection - NON-DIALYSIS  60 mcg Subcutaneous Q Tue-1800  . docusate sodium  100 mg Oral BID  . gentamicin cream  1  application Topical Daily  . heparin  5,000 Units Subcutaneous Q12H  . kidney failure book   Does not apply Once  . lacosamide  75 mg Oral BID  . melatonin  5 mg Oral QHS  . metoCLOPramide  5 mg Oral TID AC  . metoprolol tartrate  6.25 mg Oral BID  . multivitamin  1 tablet Oral QHS  . pantoprazole  40 mg Oral Daily  . sodium chloride  1 g Oral BID WC  . triamcinolone cream   Topical TID

## 2020-08-25 NOTE — Progress Notes (Signed)
Speech Language Pathology Weekly Progress and Session Note  Patient Details  Name: Amy [name] MRN: [id number] Date of Birth: 1951-01-18  Beginning of progress report period: August 18, 2020 End of progress report period: August 25, 2020  Today's Date: 08/25/2020 SLP Individual Time: 1500-1530 SLP Individual Time Calculation (min): 30 min  Short Term Goals: Week 3: SLP Short Term Goal 1 (Week 3): Patient will utilize external memory aids to recall new, daily information with supervision A verbal cues. SLP Short Term Goal 1 - Progress (Week 3): Met SLP Short Term Goal 2 (Week 3): Pt will demonstrate sustained attention in functional tasks in 30 minue intervals with supervision A verbal cues for redirection. SLP Short Term Goal 2 - Progress (Week 3): Not met SLP Short Term Goal 3 (Week 3): Pt will demonstrate mildly complex problem solving skills in functional tasks with min A verbal cues. SLP Short Term Goal 3 - Progress (Week 3): Not met SLP Short Term Goal 4 (Week 3): Pt will demonstrate self-monitor and self-correction in functional errors with min A verbal cues. SLP Short Term Goal 4 - Progress (Week 3): Not met    New Short Term Goals: Week 4: SLP Short Term Goal 1 (Week 4): STG=LTG due to short ELOS (5/3)  Weekly Progress Updates: Pt made slow progress reporting period, due to severity of delayed processes and short term recall deficits. Pt participated in mildly complex problem solving, error awareness, initiated memory notebook and began education with pt's daughter. LTG will likely need to be downgraded if slow progress continues. Pt would continue to benefit from skilled ST services in order to maximize functional independence and reduce burden of care, requiring 24 hour supervision at discharge with continued skilled ST services.      Intensity: Minumum of 1-2 x/day, 30 to 90 minutes Frequency: 3 to 5 out of 7 days Duration/Length of Stay: 09/02/20 Treatment/Interventions:  Cognitive remediation/compensation;Cueing hierarchy;Functional tasks;Medication managment;Patient/family education;Internal/external aids;Therapeutic Activities;Environmental controls   Daily Session  Skilled Therapeutic Interventions:Skilled ST services focused on cognitive skills. Pt demonstrated recall of earlier ST session with supervision A verbal cues for memory notebook. SLP facilitated familiar mildly complex problem solving skill in ALFA daily math problems task, pt demonstrated accuracy in 5 out 8 questions. Pt required extra time and encouragement to utilize written aids to support recall strategies. SLP educated pt's daughter to provide extra time. Pt was left in room with call bell within reach and bed alarm set. SLP recommends to continue skilled services.    General    Pain Pain Assessment Pain Scale: Faces Pain Score: 0-No pain Faces Pain Scale: No hurt  Therapy/Group: Individual Therapy  Urijah Arko  [facility] 08/25/2020, 4:09 PM

## 2020-08-25 NOTE — Telephone Encounter (Addendum)
I haven't seen anything from [facility] either. Per chart patient is currently admitted to [facility] hospital. I tried calling Lattie Haw to ask what type of fax she sent over to our office and if possible to refax. Left message to call back. OK for [facility] to advise Lattie Haw to refax report.

## 2020-08-25 NOTE — Telephone Encounter (Signed)
Lattie Haw with [facility] returned my call. She advised me that patient had a CT cervical spine done on 07/25/2020 while in the ER. There was an UNEXPECTED FINDING: Thyroid nodules. Recommend dedicated thyroid Ultrasound on a nonemergent basis.    This report is in care every where and Lattie Haw advised me that  she is going to refax the report again so that Dr. Caryn Section can follow up on the thyroid nodules.

## 2020-08-26 DIAGNOSIS — G479 Sleep disorder, unspecified: Secondary | ICD-10-CM | POA: Diagnosis not present

## 2020-08-26 DIAGNOSIS — E876 Hypokalemia: Secondary | ICD-10-CM | POA: Diagnosis not present

## 2020-08-26 DIAGNOSIS — G8918 Other acute postprocedural pain: Secondary | ICD-10-CM | POA: Diagnosis not present

## 2020-08-26 DIAGNOSIS — S065X9A Traumatic subdural hemorrhage with loss of consciousness of unspecified duration, initial encounter: Secondary | ICD-10-CM | POA: Diagnosis not present

## 2020-08-26 NOTE — Progress Notes (Signed)
Physical Therapy Weekly Progress Note  Patient Details  Name: Amy [name] MRN: [id number] Date of Birth: [birth year]  Beginning of progress report period: August 19, 2020 End of progress report period: August 26, 2020  Today's Date: 08/26/2020 PT Individual Time:Session1: 1130-1200; [name]: [id number]  PT Individual Time Calculation (min): 30 min & 30 min  Patient has met 2 of 4 short term goals.  Patient has been more consistent with transfer for bed<>chair and sit to stand at the mod A level and she has been able to ambulate up to 15' with +2 A and did negotiate 1 step with RW and +2 A and has been able to stand for up to 40 seconds with mod A with RW and +2 for functional task (changed out sacral pad).  She continues to need encouragement at times to participate, today refusing OOB in her second PT session despite the encouragement.  She remains with decreased strength, decreased balance, decreased activity tolerance, poor safety awareness and deficit awareness and remains at high risk for falls.    Patient continues to demonstrate the following deficits muscle weakness, impaired timing and sequencing and decreased motor planning and decreased initiation, decreased problem solving, decreased memory and delayed processing and therefore will continue to benefit from skilled PT intervention to increase functional independence with mobility.  Patient not progressing toward long term goals.  See goal revision..  Continue plan of care.  PT Short Term Goals Week 3:  PT Short Term Goal 1 (Week 3): Patient will perform sit to stand transfers with mod A consistently. PT Short Term Goal 1 - Progress (Week 3): Met PT Short Term Goal 2 (Week 3): Patient will perfom bed/chair transfers with mod A consistently. PT Short Term Goal 2 - Progress (Week 3): Met PT Short Term Goal 3 (Week 3): Patient will ambulate 48' with RW and mod A with w/c follow. PT Short Term Goal 3 - Progress (Week 3): Progressing  toward goal PT Short Term Goal 4 (Week 3): standing for at least 1 minute for functional ADL with mod A. PT Short Term Goal 4 - Progress (Week 3): Progressing toward goal Week 4:  PT Short Term Goal 1 (Week 4): Patient will perform at least 50% of transfers with min A. PT Short Term Goal 2 (Week 4): Patient will ambulate 39' with RW and mod A. PT Short Term Goal 3 (Week 4): Patient will propel w/c 100' with S PT Short Term Goal 4 (Week 4): Patient will stand for 1 minute for functional task with min A.  Skilled Therapeutic Interventions/Progress Updates:    Session1:  Patient in recliner and agreeable to bedside therapy.  Performed sit to stand from recliner x 5 reps to RW mod A and mod cues for technique.  Standing once up to 40-45 seconds for replacement of sacral pad with second person A as pt c/o felt like a sore on her bottom.  Patient seated edge of chair for LAQ, hip flexion x 10.  In recliner with legs elevated for hip adductor squeezes, hip abduction and heel slides all x 10.  Left in recliner with needs/call bell in reach.  [place]:  Patient in supine in bed and reports note feeling well.  States she does not want to get back up OOB.  Encouraged to try as needs to ambulate as due to d/c home in a week.  She initially did not decline, but agreeable to a drink as no beverages in her room.  Obtained grape  juice at pt request.  She drank a little then burped with some slight regurgitation.  Then felt nauseated and adamantly refused OOB.  Agreeable to supine therex.  Patient performed UE bicep curls, chest press and horizontal abduction with yellow t-band all x 10.  Then bridging w/ 5 sec hold, hooklying marching, hooklying hip abduction with orange t-band and lateral trunk rotation all x 10.  Removed ace wraps and placed pillow to float heels.  Patient left with call bell and needs in reach and bed alarm active. Patient missed 15 minutes skilled PT due to fatigue, not feeling well.  Therapy  Documentation Precautions:  Precautions Precautions: Fall Precaution Comments: seizure precautions Restrictions Weight Bearing Restrictions: No Other Position/Activity Restrictions: head incision General: PT Amount of Missed Time (min): 15 Minutes PT Missed Treatment Reason: Patient ill (Comment);Patient fatigue Pain: Pain Assessment Pain Scale: 0-10 Pain Score: 0-No pain   Therapy/Group: Individual Therapy  Reginia Naas  Magda Kiel, PT 08/26/2020, 5:16 PM

## 2020-08-26 NOTE — Progress Notes (Signed)
Patient ID: Amy [name], female   DOB: [birth year], 70 y.o.   MRN: [id number] Spoke with husband who expressed concerns regarding discharge date and being ready. Discussed hiring assist and agencies that have this. Discussed changing to HD so could go to a SNF. Husband questions if SDH started before leaving the rehab unit and going home. Discussed changing discharge date and up to MD and team and pt will need to participate in therapies. He does feel she close to being ready to go home next week. He plans to reach out to MD.

## 2020-08-26 NOTE — Progress Notes (Signed)
Subjective:  Tolerated PD last pm.  No c/o abd pain, dyspnea, edema.  No new issues.   Objective Vital signs in last 24 hours: Vitals:   08/25/20 1710 08/25/20 2000 08/26/20 0457 08/26/20 0705  BP: 133/82 124/73 119/69 130/76  Pulse: 77 85 80 85  Resp: 16 16 16 16   Temp: 99.1 F (37.3 C) 97.9 F (36.6 C) 98 F (36.7 C) 98 F (36.7 C)  TempSrc: Oral   Oral  SpO2: 99% 100% 100% 100%  Weight: 46.3 kg   45.5 kg  Height:       Weight change: 0 kg  Physical Exam: General: Alert thin chronically ill female NAD pleasant Heart: RRR, no MRG Lungs:  nonlabored breathing Abdomen: Bowel sounds normoactive, soft NTND no ascites, PD catheter present Extremities: No pedal edema Dialysis Access: PD catheter exit site clean dry  Dialysis Orders:  [place] followed by Dr. Candiss Norse ([place]). 4 exchanges overnight, 2000 cc and 1.5 hrs each.   Problem/Plan:  1. ESRD[place]PDin [place], [place], f/b Dr Candiss Norse of [place].Cont PD nightly4x 2000cc. Blood effluent seen on takedown 4/15 but has not had any moresince.PD gram stain, cultures and cell count not consistent with infection.  2.Hypokalemia:intermittent, repleting PRN; check labs tomorrow 3.Vol overload-resolved/HTN-on admit sig LE dependent edema and UE edemaon admission.Volume status resolved since admission with PD , euvolemic on exam.Continues to have good UF with all 1.5% exchanges, will continue. On low-dose p.o. Lopressor 6.25 mg twice daily 4. Debility: 2/2 Multiple med issues, most recently SDH recovery and NSTEMI w/ CAD and low EF. Readmitted to CIR on 08/04/20. 5.Fall w/ subdural hematoma- sp SDH evacuation at [facility] on3/24/22 6.Anemia of ESRD Hgb 9.9 on Aranesp 60 MCG q. Tuesday subcut. 7. MBD= phosphorus 3.6 without binder; corrected calcium mid 10s, no vitamin D 8. Nausea/ vomiting-now resolved etiology unclear, uremiaunlikely.PD cell count in the past and culture unrevealing, Preliminary report  of body fluid culture shows ngtdand effluent has been clear. 9. Malnutrition/ debility-albumin 1.0 verylow albumin. It has been d/w husband on several occassions in the past about a possible trial of HD due to low albumin in a PD patient. He had stated in the past they are not interested and feels she would not tolerate HD. Nepro supplement  10.Multivessel CAD- with reduced EF 25-30% by recent cath in Feb 2022. Per TCTSwasnotCABG candidate. UnderwentPCI to LAD. At [facility] per family showed improved EF 55%. 11. Chronichyponatremia-stable in high 120s, appears euvolemic.  D/Cd NaCl tabs for now.  12. Metabolic acidosis-bicarb has been ok off supplement. 14.H/oSeizures-per primary team  [name] MD [facility] Pager [phone]  Labs: Basic Metabolic Panel: Recent Labs  Lab 08/24/20 0423 08/25/20 0448 08/25/20 1210  NA 126* 127* 127*  K 4.9 3.3* 3.7  CL 95* 94* 90*  CO2 20* 25 24  GLUCOSE 80 94 108*  BUN 22 19 22   CREATININE 2.66* 2.87* 3.10*  CALCIUM 8.1* 8.3* 8.5*  PHOS  --   --  3.9   Liver Function Tests: Recent Labs  Lab 08/25/20 1210  ALBUMIN 1.3*   No results for input(s): LIPASE, AMYLASE in the last 168 hours. No results for input(s): AMMONIA in the last 168 hours. CBC: Recent Labs  Lab 08/25/20 0448  WBC 5.7  HGB 9.9*  HCT 30.8*  MCV 98.1  PLT 265   Cardiac Enzymes: No results for input(s): CKTOTAL, CKMB, CKMBINDEX, TROPONINI in the last 168 hours. CBG: No results for input(s): GLUCAP in the last  168 hours.  Studies/Results: No results found. Medications:  . aspirin EC  81 mg Oral Daily  . atorvastatin  40 mg Oral Daily  . brivaracetam  75 mg Oral BID  . butalbital-acetaminophen-caffeine  1 tablet Oral TID with meals  . citalopram  10 mg Oral Daily  . darbepoetin (ARANESP) injection - NON-DIALYSIS  60 mcg Subcutaneous Q Tue-1800  . docusate sodium  100 mg Oral BID  . gentamicin cream  1 application  Topical Daily  . heparin  5,000 Units Subcutaneous Q12H  . kidney failure book   Does not apply Once  . lacosamide  75 mg Oral BID  . melatonin  5 mg Oral QHS  . metoCLOPramide  5 mg Oral TID AC  . metoprolol tartrate  6.25 mg Oral BID  . multivitamin  1 tablet Oral QHS  . pantoprazole  40 mg Oral Daily  . triamcinolone cream   Topical TID

## 2020-08-26 NOTE — Progress Notes (Signed)
Occupational Therapy Session Note  Patient Details  Name: Amy [name] MRN: [id number] Date of Birth: 26-Oct-1950  Today's Date: 08/26/2020 OT Individual Time: 1000-1100   &   1345-1425 OT Individual Time Calculation (min): 60 min    &   40 min   Short Term Goals: Week 3:  OT Short Term Goal 1 (Week 3): patient will complete toileting with mod A OT Short Term Goal 2 (Week 3): patient will complete bed mobility and SPT with min A OT Short Term Goal 3 (Week 3): patient will complete LB bathing/dressing with min a  Skilled Therapeutic Interventions/Progress Updates:    AM session:   Patient in bed, alert, states that pain is under control at this time.  Completed lower body bathing and dressing bed level with mod A for each task.  Supine to sit mod A.  Sit to stand onto stedy surface mod A from elevated bed.  To toilet via stedy.  Dependent for clothing management, hygiene and donn/pulling up clean brief and pants.  Sit to stand from toilet max A.  stedy to w/c.  Completed oral care with set up, upper body bathing with CS and doff/donn OH shirt min/mod A.  SPT w/c to recliner with max A.  Completed trunk mobility and upperbody light exercises at slow rate.  She remained seated in recliner at close of session.  Call bell in hand, seat belt alarm set - PT scheduled in 30 minutes.     PM session:    Patient seated in recliner.  She states that she is tired and not up to therapy this afternoon but agreeable to change of position and return to bed.  Sit to stand from recliner max A, SPT with RW recliner to w/c max A for upright posture, weight shift and walker management.  3 steps from w/c surface to bed with RW again requiring max A.  Unsupported sitting with CS.  Sit to supine max A for leg management.  She is able to complete bilateral arm OH reach at slow rate.  She remained in bed at close of session, bed alarm set and call bell in hand.       Therapy Documentation Precautions:   Precautions Precautions: Fall Precaution Comments: seizure precautions Restrictions Weight Bearing Restrictions: No Other Position/Activity Restrictions: head incision   Therapy/Group: Individual Therapy  Carlos Levering 08/26/2020, 7:27 AM

## 2020-08-26 NOTE — Progress Notes (Signed)
Nutrition Follow-up  DOCUMENTATION CODES:   Underweight,Severe malnutrition in context of chronic illness  INTERVENTION:   -Pt with a 19% weight loss in less than 1 month. PO intake remains inadequate. Continue to recommend enteral nutrition support if within pt's [place].  -ContinueMagicCup TID with meals, each supplement provides 290 kcal and 9 grams of protein  - Continue chocolate milk TID with mealsper pt request  -Continue vanilla ice cream TID with meals (to mix with orange cream Magic Cups)  - Encourage adequate PO  NUTRITION DIAGNOSIS:   Severe Malnutrition related to chronic illness (ESRD) as evidenced by severe fat depletion,severe muscle depletion.  Ongoing, being addressed via supplements  GOAL:   Patient will meet greater than or equal to 90% of their needs  Unmet  MONITOR:   PO intake,Supplement acceptance,Labs,Weight trends,I & O's  REASON FOR ASSESSMENT:   Other (history of malnutrition)    ASSESSMENT:   70 year old female with PMH of ESRD on PD, chronic hyponatremia, C diff colitis, seizures, NSTEMI s/p PCI w/ DAPT, chronic issues with N/V resulting in debility and CIR stay 3/04-3/24/22. Pt was discharged to home and experienced a mild fall that afternoon followed by obtundation that evening. Pt was evaluated at [facility] 07/24/20 and found to have acute on chronic left SDH. Pt was transferred to [facility] and underwent left partial craniotomy for evacuation of SDH with placement of subdural drain which was removed on 3/28. Cortrak placed at [facility] for nutrition support. Admitted to CIR on 4/04.  Spoke with pt at bedside. Pt eating spaghetti from lunch tray at time of RD visit. Pt states that she enjoys the spaghetti. RD assisted pt in opening ice cream and Magic Cup on her meal tray.  Pt denies any N/V at time of RD visit. However, per review of notes, pt with episode of emesis after breakfast after "she ate her whole fruit cup quickly." Pt reports that her  appetite is good "even though it might not look that way."  Pt with a 19% weight loss in less than 1 month. PO intake remains poor. Continue to recommend enteral nutrition support if within pt's [place].  CIR admission weight: 55.5 kg Current weight: 45.1  kg  Meal Completion: 15-75%  Medications reviewed and include: aranesp weekly, colace, melatonin, reglan 5 mg TID before meals, rena-vit, protonix  Labs reviewed: sodium 126, potassium 3.1  Diet Order:   Diet Order            Diet regular Room service appropriate? Yes; Fluid consistency: Thin; Fluid restriction: Other (see comments)  Diet effective now                 EDUCATION NEEDS:   Education needs have been addressed  Skin:  Skin Assessment: Skin Integrity Issues: Incisions: head  Last BM:  08/26/20  Height:   Ht Readings from Last 1 Encounters:  08/04/20 5\' 4"  (1.626 m)    Weight:   Wt Readings from Last 1 Encounters:  08/26/20 45.1 kg    BMI:  Body mass index is 17.07 kg/m.  Estimated Nutritional Needs:   Kcal:  1700-1900  Protein:  85-100 grams  Fluid:  1000 ml + UOP    Gustavus Bryant, MS, RD, LDN Inpatient Clinical Dietitian Please see AMiON for contact information.

## 2020-08-26 NOTE — Progress Notes (Signed)
Occupational Therapy Weekly Progress Note  Patient Details  Name: Amy [name] MRN: [id number] Date of Birth: Jul 16, 1950  Beginning of progress report period: August 19, 2020 End of progress report period: August 26, 2020     Patient has met 0 of 3 short term goals.  Patient with inconsistent tolerance for activity and staying OOB.  She continues with generalize weakness limiting ability to stand and perform toileting/lower body self care.    Patient continues to demonstrate the following deficits: muscle weakness, decreased cardiorespiratoy endurance and decreased sitting balance, decreased standing balance and decreased postural control and therefore will continue to benefit from skilled OT intervention to enhance overall performance with BADL and Reduce care partner burden.  Patient progressing toward long term goals..  Continue plan of care.  OT Short Term Goals Week 3:  OT Short Term Goal 1 (Week 3): patient will complete toileting with mod A OT Short Term Goal 1 - Progress (Week 3): Not met OT Short Term Goal 2 (Week 3): patient will complete bed mobility and SPT with min A OT Short Term Goal 2 - Progress (Week 3): Not met OT Short Term Goal 3 (Week 3): patient will complete LB bathing/dressing with min a OT Short Term Goal 3 - Progress (Week 3): Not met Week 4:  OT Short Term Goal 1 (Week 4): Patient will complete bed mobility with min A OT Short Term Goal 2 (Week 4): patient will complete toileting with mod A OT Short Term Goal 3 (Week 4): patient will complete sit to stand and SPT with RW consistent min/mod A   [name] A Zophia Marrone 08/26/2020, 12:41 PM

## 2020-08-26 NOTE — Progress Notes (Signed)
[place] PHYSICAL MEDICINE & REHABILITATION PROGRESS NOTE  Subjective/Complaints: Patient seen laying in bed this morning.  She states she slept well overnight.  She states she had some nausea yesterday after eating some food.  That resolved.  ROS: Denies CP, SOB, N/V/D  Objective: Vital Signs: Blood pressure 130/76, pulse 85, temperature 98 F (36.7 C), temperature source Oral, resp. rate 16, height 5\' 4"  (1.626 m), weight 45.5 kg, SpO2 100 %. No results found. Recent Labs    08/25/20 0448  WBC 5.7  HGB 9.9*  HCT 30.8*  PLT 265   Recent Labs    08/25/20 0448 08/25/20 1210  NA 127* 127*  K 3.3* 3.7  CL 94* 90*  CO2 25 24  GLUCOSE 94 108*  BUN 19 22  CREATININE 2.87* 3.10*  CALCIUM 8.3* 8.5*    Intake/Output Summary (Last 24 hours) at 08/26/2020 1229 Last data filed at 08/26/2020 0831 Gross per 24 hour  Intake 480 ml  Output --  Net 480 ml        Physical Exam: BP 130/76 (BP Location: Right Arm)   Pulse 85   Temp 98 F (36.7 C) (Oral)   Resp 16   Ht 5\' 4"  (1.626 m)   Wt 45.5 kg   SpO2 100%   BMI 17.22 kg/m  Constitutional: No distress . Vital signs reviewed. HENT: Normocephalic.  Atraumatic. Eyes: EOMI. No discharge. Cardiovascular: No JVD.  RRR. Respiratory: Normal effort.  No stridor.  Bilateral clear to auscultation. GI: Non-distended.  BS +.  + PD site. Skin: Warm and dry.  Intact. Psych: Normal mood.  Normal behavior. Musc: No edema in extremities.  No tenderness in extremities. Neuro: Alert Motor: Motor: LUE: 4-4+/5 proximal distal, stable RUE: 4/5 proximal distal LLE: Hip flexion 4-/5, knee extension 4-/5, ankle dorsiflexion 4-/5, stable RLE: Hip flexion 3+/5, knee extension 3+/5, ankle dorsiflexion 3-3+/5, stable   Assessment/Plan: 1. Functional deficits which require 3+ hours per day of interdisciplinary therapy in a comprehensive inpatient rehab setting.  Physiatrist is providing close team supervision and 24 hour management of  active medical problems listed below.  Physiatrist and rehab team continue to assess barriers to discharge/monitor patient progress toward functional and medical goals   Care Tool:  Bathing    Body parts bathed by patient: Right arm,Left arm,Chest,Abdomen,Front perineal area,Face,Right upper leg,Left upper leg,Buttocks,Right lower leg,Left lower leg   Body parts bathed by helper: Right lower leg,Left lower leg,Buttocks,Front perineal area     Bathing assist Assist Level: Moderate Assistance - Patient 50 - 74%     Upper Body Dressing/Undressing Upper body dressing   What is the patient wearing?: Pull over shirt    Upper body assist Assist Level: Minimal Assistance - Patient > 75%    Lower Body Dressing/Undressing Lower body dressing      What is the patient wearing?: Pants     Lower body assist Assist for lower body dressing: Moderate Assistance - Patient 50 - 74%     Toileting Toileting    Toileting assist Assist for toileting: Total Assistance - Patient < 25%     Transfers Chair/bed transfer  Transfers assist     Chair/bed transfer assist level: Moderate Assistance - Patient 50 - 74% Chair/bed transfer assistive device: Armrests   Locomotion Ambulation   Ambulation assist   Ambulation activity did not occur: Safety/medical concerns  Assist level: 2 helpers Assistive device: Walker-rolling Max distance: 15'   Walk 10 feet activity   Assist  Walk 10 feet  activity did not occur: Safety/medical concerns  Assist level: 2 helpers Assistive device: Walker-rolling   Walk 50 feet activity   Assist Walk 50 feet with 2 turns activity did not occur: Safety/medical concerns         Walk 150 feet activity   Assist Walk 150 feet activity did not occur: Safety/medical concerns         Walk 10 feet on uneven surface  activity   Assist Walk 10 feet on uneven surfaces activity did not occur: Safety/medical concerns          Wheelchair     Assist Will patient use wheelchair at discharge?: Yes Type of Wheelchair: Manual    Wheelchair assist level: Supervision/Verbal cueing Max wheelchair distance: 69'    Wheelchair 50 feet with 2 turns activity    Assist    Wheelchair 50 feet with 2 turns activity did not occur: Safety/medical concerns   Assist Level: Supervision/Verbal cueing   Wheelchair 150 feet activity     Assist  Wheelchair 150 feet activity did not occur: Safety/medical concerns        Medical Problem List and Plan: 1.  Right hemiparesis and aphasia secondary to large left SDH s/p craniotomy  Continue CIR 2.  Impaired mobility -DVT/anticoagulation:  Mechanical: Continue Sequential compression devices, below knee Bilateral lower extremities             -antiplatelet therapy: N/A due to bleed.  3. Chronic migraines/ Pain Management: Continue Fioricet 3-4 times a day on average. Added prn Fioricet q8H  Controlled with meds on 4/26  Monitor with increased exertion 4. Mood: LCSW to follow for evaluation and support.  -antipsychotic agents: N/A 5. Neuropsych: This patient is not fully capable of making decisions on her own behalf. 6. Skin/Wound Care: Monitor wound for healing.             -Family to provide meals from home as able.  -bilateral PRAFOs ordered but patient refuses to wear 7. Fluids/Electrolytes/Nutrition: Continue PD--does not need fluid restriction.              --has had chronic issues with variable intake as well as N/V likely due to gastroparesis.  8. Chronic Hypotension: ContinueTEDs when out of bed             Vitals:   08/26/20 0457 08/26/20 0705  BP: 119/69 130/76  Pulse: 80 85  Resp: 16 16  Temp: 98 F (36.7 C) 98 F (36.7 C)  SpO2: 100% 100%   Controlled on 4/26 9.  ESRD on PD: Nephrology to assist/manage PD.  10.  Recurrent Seizures: Has been seizure free, continue Brivaracetam and Lacosamide bid.              --Lacosamide was decreased on  03/31 to avoid sedative SE.  No breakthrough seizures since admission- 4/26 11. CAD/NSTEMI s/p PCI: Treated medically.   Off DAPT due to bleed   Continue Lipitor.   Metoprolol low-dose resumed  Heart rate controlled on 4/26 12. Acute on chronic anemia:   Hemoglobin 9.9 on 4/25  Continue to monitor 13. H/o Depression: Continue Celexa 14. Sick Euthyroid: Will need follow up as illness resolves.              --TSH- 5.80/Free T4-1.2 (03/27) 15. Gastroparesis w/chronic N/V/anorexia: Has been ongoing per records review.              --question failure of PD (husband reports no N/V with brief episode of HD in the  past)  Continue Reglan 5 3 times daily  Hesitant to continue increase medications due to #18  Nausea improved overall 16.  Hyponatremia             --chronic hyponatremia with Na-130-133 range  Sodium 127 on 4/23             --Continue Sodium bicarb to manage metabolic acidosis. 17.  Severe hypoalbuminemia  Supplement d/ced given nausea and vomiting- recommended nuts as source of protein. Her daughter has brought in.  18.  Prolonged QTC  ECG reviewed, improved since last visit  Compazine and dose/frequency decreased 19.  Sleep disturbance- "never a good sleeper" , on PD a night and gets disturbed by hospital sounds, cont trazodone and melatonin    Reasonably controlled 20.  Hypokalemia  Potassium 3.7 on 4/25  Supplementation per nephrology  LOS: 22 days A FACE TO FACE EVALUATION WAS PERFORMED  Amy [name] Lorie Phenix 08/26/2020, 12:29 PM

## 2020-08-27 ENCOUNTER — Inpatient Hospital Stay (HOSPITAL_COMMUNITY): Payer: Medicare Other

## 2020-08-27 DIAGNOSIS — R569 Unspecified convulsions: Secondary | ICD-10-CM | POA: Diagnosis not present

## 2020-08-27 DIAGNOSIS — G40009 Localization-related (focal) (partial) idiopathic epilepsy and epileptic syndromes with seizures of localized onset, not intractable, without status epilepticus: Secondary | ICD-10-CM

## 2020-08-27 DIAGNOSIS — G8918 Other acute postprocedural pain: Secondary | ICD-10-CM | POA: Diagnosis not present

## 2020-08-27 DIAGNOSIS — D649 Anemia, unspecified: Secondary | ICD-10-CM | POA: Diagnosis not present

## 2020-08-27 DIAGNOSIS — G479 Sleep disorder, unspecified: Secondary | ICD-10-CM | POA: Diagnosis not present

## 2020-08-27 DIAGNOSIS — S065X9A Traumatic subdural hemorrhage with loss of consciousness of unspecified duration, initial encounter: Secondary | ICD-10-CM | POA: Diagnosis not present

## 2020-08-27 LAB — CBC
HCT: 31.1 % — ABNORMAL LOW (ref 36.0–46.0)
Hemoglobin: 10.4 g/dL — ABNORMAL LOW (ref 12.0–15.0)
MCH: 32.8 pg (ref 26.0–34.0)
MCHC: 33.4 g/dL (ref 30.0–36.0)
MCV: 98.1 fL (ref 80.0–100.0)
Platelets: 243 10*3/uL (ref 150–400)
RBC: 3.17 MIL/uL — ABNORMAL LOW (ref 3.87–5.11)
RDW: 17.5 % — ABNORMAL HIGH (ref 11.5–15.5)
WBC: 6.5 10*3/uL (ref 4.0–10.5)
nRBC: 0 % (ref 0.0–0.2)

## 2020-08-27 LAB — RENAL FUNCTION PANEL
Albumin: <1 g/dL — ABNORMAL LOW (ref 3.5–5.0)
Anion gap: 8 (ref 5–15)
BUN: 19 mg/dL (ref 8–23)
CO2: 25 mmol/L (ref 22–32)
Calcium: 8.1 mg/dL — ABNORMAL LOW (ref 8.9–10.3)
Chloride: 93 mmol/L — ABNORMAL LOW (ref 98–111)
Creatinine, Ser: 2.93 mg/dL — ABNORMAL HIGH (ref 0.44–1.00)
GFR, Estimated: 17 mL/min — ABNORMAL LOW (ref >60)
Glucose, Bld: 98 mg/dL (ref 70–99)
Phosphorus: 4.1 mg/dL (ref 2.5–4.6)
Potassium: 3.1 mmol/L — ABNORMAL LOW (ref 3.5–5.1)
Sodium: 126 mmol/L — ABNORMAL LOW (ref 135–145)

## 2020-08-27 MED ORDER — DELFLEX-LC/1.5% DEXTROSE 344 MOSM/L IP SOLN: INTRAPERITONEAL | Status: DC

## 2020-08-27 MED ORDER — LACOSAMIDE 50 MG PO TABS
50.0000 mg | ORAL_TABLET | Freq: Two times a day (BID) | ORAL | Status: DC
  Administered 2020-08-27 – 2020-09-15 (×38): 50 mg via ORAL
  Filled 2020-08-27 (×38): qty 1

## 2020-08-27 MED ORDER — LORAZEPAM 2 MG/ML IJ SOLN: 2.0000 mg | INTRAMUSCULAR | Status: DC | PRN

## 2020-08-27 MED ORDER — POTASSIUM CHLORIDE CRYS ER 20 MEQ PO TBCR
40.0000 meq | EXTENDED_RELEASE_TABLET | Freq: Once | ORAL | Status: AC
  Administered 2020-08-27: 40 meq via ORAL
  Filled 2020-08-27: qty 2

## 2020-08-27 NOTE — Progress Notes (Signed)
EEG complete - results pending. EEG complete - results pending.  

## 2020-08-27 NOTE — Progress Notes (Addendum)
[place] PHYSICAL MEDICINE & REHABILITATION PROGRESS NOTE  Subjective/Complaints: Patient seen sitting up in bed this morning.  She states she slept fairly overnight, her baseline.  She notes she had some nausea and vomiting this morning after she states she ate her whole fruit cup quickly.  She was seen by nephrology yesterday, notes reviewed-labs ordered.  ROS: + Nausea, vomiting.  Denies CP, SOB, diarrhea  Objective: Vital Signs: Blood pressure 117/64, pulse 76, temperature 98.4 F (36.9 C), temperature source Oral, resp. rate 16, height 5\' 4"  (1.626 m), weight 45.1 kg, SpO2 100 %. No results found. Recent Labs    08/25/20 0448 08/27/20 0514  WBC 5.7 6.5  HGB 9.9* 10.4*  HCT 30.8* 31.1*  PLT 265 243   Recent Labs    08/25/20 1210 08/27/20 0514  NA 127* 126*  K 3.7 3.1*  CL 90* 93*  CO2 24 25  GLUCOSE 108* 98  BUN 22 19  CREATININE 3.10* 2.93*  CALCIUM 8.5* 8.1*    Intake/Output Summary (Last 24 hours) at 08/27/2020 1050 Last data filed at 08/27/2020 3818 Gross per 24 hour  Intake 480 ml  Output --  Net 480 ml        Physical Exam: BP 117/64 (BP Location: Left Arm)   Pulse 76   Temp 98.4 F (36.9 C) (Oral)   Resp 16   Ht 5\' 4"  (1.626 m)   Wt 45.1 kg   SpO2 100%   BMI 17.07 kg/m  Constitutional: No distress . Vital signs reviewed. HENT: Normocephalic.  Atraumatic. Eyes: EOMI. No discharge. Cardiovascular: No JVD.  RRR. Respiratory: Normal effort.  No stridor.  Bilateral clear to auscultation. GI: Non-distended.  BS +.  + PD site. Skin: Warm and dry.  Intact. Psych: Normal mood.  Normal behavior. Musc: No edema in extremities.  No tenderness in extremities. Neuro: Alert Motor: Motor: LUE: 4-4+/5 proximal distal, stable RUE: 4/5 proximal distal LLE: Hip flexion 4-/5, knee extension 4-/5, ankle dorsiflexion 4-/5, unchanged RLE: Hip flexion 3+/5, knee extension 3+/5, ankle dorsiflexion 3-3+/5, unchanged   Assessment/Plan: 1. Functional deficits  which require 3+ hours per day of interdisciplinary therapy in a comprehensive inpatient rehab setting.  Physiatrist is providing close team supervision and 24 hour management of active medical problems listed below.  Physiatrist and rehab team continue to assess barriers to discharge/monitor patient progress toward functional and medical goals   Care Tool:  Bathing    Body parts bathed by patient: Right arm,Left arm,Chest,Abdomen,Front perineal area,Face,Right upper leg,Left upper leg,Buttocks,Right lower leg,Left lower leg   Body parts bathed by helper: Right lower leg,Left lower leg,Buttocks,Front perineal area     Bathing assist Assist Level: Moderate Assistance - Patient 50 - 74%     Upper Body Dressing/Undressing Upper body dressing   What is the patient wearing?: Pull over shirt    Upper body assist Assist Level: Minimal Assistance - Patient > 75%    Lower Body Dressing/Undressing Lower body dressing      What is the patient wearing?: Pants     Lower body assist Assist for lower body dressing: Moderate Assistance - Patient 50 - 74%     Toileting Toileting    Toileting assist Assist for toileting: Total Assistance - Patient < 25%     Transfers Chair/bed transfer  Transfers assist     Chair/bed transfer assist level: Moderate Assistance - Patient 50 - 74% Chair/bed transfer assistive device: Armrests   Locomotion Ambulation   Ambulation assist   Ambulation activity  did not occur: Safety/medical concerns  Assist level: 2 helpers Assistive device: Walker-rolling Max distance: 15'   Walk 10 feet activity   Assist  Walk 10 feet activity did not occur: Safety/medical concerns  Assist level: 2 helpers Assistive device: Walker-rolling   Walk 50 feet activity   Assist Walk 50 feet with 2 turns activity did not occur: Safety/medical concerns         Walk 150 feet activity   Assist Walk 150 feet activity did not occur: Safety/medical  concerns         Walk 10 feet on uneven surface  activity   Assist Walk 10 feet on uneven surfaces activity did not occur: Safety/medical concerns         Wheelchair     Assist Will patient use wheelchair at discharge?: Yes Type of Wheelchair: Manual    Wheelchair assist level: Supervision/Verbal cueing Max wheelchair distance: 31'    Wheelchair 50 feet with 2 turns activity    Assist    Wheelchair 50 feet with 2 turns activity did not occur: Safety/medical concerns   Assist Level: Supervision/Verbal cueing   Wheelchair 150 feet activity     Assist  Wheelchair 150 feet activity did not occur: Safety/medical concerns        Medical Problem List and Plan: 1.  Right hemiparesis and aphasia secondary to large left SDH s/p craniotomy  Continue CIR  Husband with questions regarding discharge, will discuss  Team conference today to discuss current and goals and coordination of care, home and environmental barriers, and discharge planning with nursing, case manager, and therapies. Please see conference note from today as well.  2.  Impaired mobility -DVT/anticoagulation:  Mechanical: Continue Sequential compression devices, below knee Bilateral lower extremities             -antiplatelet therapy: N/A due to bleed.  3. Chronic migraines/ Pain Management: Continue Fioricet 3-4 times a day on average. Added prn Fioricet q8H  Controlled with meds on 4/27  Monitor with increased exertion 4. Mood: LCSW to follow for evaluation and support.  -antipsychotic agents: N/A 5. Neuropsych: This patient is not fully capable of making decisions on her own behalf. 6. Skin/Wound Care: Monitor wound for healing.             -Family to provide meals from home as able.  -bilateral PRAFOs ordered but patient refuses to wear 7. Fluids/Electrolytes/Nutrition: Continue PD--does not need fluid restriction.              --has had chronic issues with variable intake as well as N/V  likely due to gastroparesis.  8. Chronic Hypotension: ContinueTEDs when out of bed             Vitals:   08/27/20 0403 08/27/20 0722  BP: 127/69 117/64  Pulse: 76   Resp: 16 16  Temp: 98.2 F (36.8 C) 98.4 F (36.9 C)  SpO2: 99% 100%   Controlled on 4/27 9.  ESRD on PD: Nephrology to assist/manage PD.  10.  Recurrent Seizures: Has been seizure free, continue Brivaracetam and Lacosamide bid.              --Lacosamide was decreased on 03/31 to avoid sedative SE.  No breakthrough seizures since admission- 4/27 11. CAD/NSTEMI s/p PCI: Treated medically.   Off DAPT due to bleed   Continue Lipitor.   Metoprolol low-dose resumed  Heart rate controlled on 4/27 12. Acute on chronic anemia:   Hemoglobin 10.4 on 4/27  Continue  to monitor 13. H/o Depression: Continue Celexa 14. Sick Euthyroid: Will need follow up as illness resolves.              --TSH- 5.80/Free T4-1.2 (03/27) 15. Gastroparesis w/chronic N/V/anorexia: Has been ongoing per records review.              --question failure of PD (husband reports no N/V with brief episode of HD in the past)  Continue Reglan 5 3 times daily  Hesitant to continue increase medications due to #18 16.  Hyponatremia             --chronic hyponatremia with Na-130-133 range  Sodium 127 on 4/23             --Continue Sodium bicarb to manage metabolic acidosis. 17.  Severe hypoalbuminemia  Supplement d/ced given nausea and vomiting- recommended nuts as source of protein. Her daughter has brought in.  18.  Prolonged QTC  ECG reviewed, improved since last visit  Compazine and dose/frequency decreased 19.  Sleep disturbance- "never a good sleeper" , on PD a night and gets disturbed by hospital sounds, cont trazodone and melatonin    Reasonably controlled 20.  Hypokalemia  Potassium 3.1 on 4/27  Supplementation per nephrology  LOS: 23 days A FACE TO FACE EVALUATION WAS PERFORMED  Mclane Arora Lorie Phenix 08/27/2020, 10:50 AM

## 2020-08-27 NOTE — Patient Care Conference (Signed)
Inpatient RehabilitationTeam Conference and Plan of Care Update Date: 08/27/2020   Time: 11:46 AM    Patient Name: Amy [name]      Medical Record Number: [id number]  Date of Birth: 06-07-1950 Sex: Female         Room/Bed: 4W06C/4W06C-01 Payor Info: Payor: MEDICARE / Plan: MEDICARE PART A AND B / Product Type: *No Product type* /    Admit Date/Time:  08/04/2020  1:38 PM  Primary Diagnosis:  SDH (subdural hematoma) [facility])  Hospital Problems: Principal Problem:   SDH (subdural hematoma) ([place]) Active Problems:   Subdural hemorrhage following injury ([place])   Acute on chronic anemia   Postoperative pain   Sleep disturbance    Expected Discharge Date: Expected Discharge Date: 09/02/20  Team Members Present: Physician leading conference: Dr. Delice Lesch Care Coodinator Present: Ovidio Kin, LCSW;Bayleigh Loflin Hervey Ard, RN, BSN, [place] Nurse Present: Dorien Chihuahua, RN PT Present: Magda Kiel, PT OT Present: Simonne Come, OT SLP Present: Nadara Mode, SLP PPS Coordinator present : Ileana Ladd, PT     Current Status/Progress Goal Weekly Team Focus  Bowel/Bladder   Pt is continent/incontinent x2.  Pt will regain full continence.  Assess q shift and prn.   Swallow/Nutrition/ Hydration             ADL's   UB bathing/ dressing min A, LB bathing/dressing mod/max A, toileting max A/dep, SPT max A, sit to stand in stedy max A  mod A  family education, OOB and activity tolerance, adl/transfer training   Mobility   mod A sit to stand to RW, ambulation 15' +2 A with RW, w/c mobility S x 50', mod A (occasional max A) squat pivot or stand pivot to w/c  min A overall  LE strenth, gait, sitting balance, participation, w/c mobility, transfers   Communication             Safety/Cognition/ Behavioral Observations            Pain   Pt reports chronic headaches; managed on current regimen.  Pain will remain <3.  Assess q shift and prn.   Skin   Bilateral burising to all extremities, healed head  incisions, abdominal PD cath, redness to buttocks.  Skin will continue to heal and remain infection free.  Assess q shift and prn.     Discharge Planning:  Husband feels discharge date too soon wants to talk with MD. May need a Family Conference due to husband not realistic regarding pt's ability to reach supervision level at DC.   Team Discussion: Continue PD, poor appetite and persistent nausea. Patient with poor progression and intolerance of being out of bed; encourage activity.  Patient on target to meet rehab goals: Currently mod assist overall. Requires +2 assit to ambulate 15', mod - max assist for squat pivot transfers and total assist/dependent for toileting. Mod - max assist for lower body care.  *See Care Plan and progress notes for long and short-term goals.   Revisions to Treatment Plan:  Downgraded goals to min - mod assist Focus on processing, memory and out of bed activities  Teaching Needs: Transfers, toileting, medications,etc. 24/7 care needs  Current Barriers to Discharge: Decreased caregiver support, Home enviroment access/layout, Behavior and Peritoneal Dialysis  Possible Resolutions to Barriers: Reinforce out of bed activities Educational sessions with spouse Recommend hired caregivers to assist spouse Referral to SNF however patient will need to agree to switch to HD from PD     Medical Summary Current Status: Right hemiparesis and  aphasia secondary to large left SDH s/p craniotomy  Barriers to Discharge: Medical stability  Barriers to Discharge Comments: +PD Possible Resolutions to Barriers/Weekly Focus: Therapies, monitor for seizures, follow labs - Hb, monitor K+ along with Nephro, monitor nausea/vomitting   Continued Need for Acute Rehabilitation Level of Care: The patient requires daily medical management by a physician with specialized training in physical medicine and rehabilitation for the following reasons: Direction of a multidisciplinary  physical rehabilitation program to maximize functional independence : Yes Medical management of patient stability for increased activity during participation in an intensive rehabilitation regime.: Yes Analysis of laboratory values and/or radiology reports with any subsequent need for medication adjustment and/or medical intervention. : Yes   I attest that I was present, lead the team conference, and concur with the assessment and plan of the team.   Dorien Chihuahua B 08/27/2020, 3:27 PM

## 2020-08-27 NOTE — Procedures (Signed)
Patient Name: Amy [name]  MRN: [id number]  Epilepsy Attending: [name]  Referring Physician/Provider: Dr Zeb Comfort Date: 08/27/2020 Duration: 23.24 mins  Patient history:   Level of alertness: Awake  AEDs during EEG study: BRV, LCM  Technical aspects: This EEG study was done with scalp electrodes positioned according to the 10-20 International system of electrode placement. Electrical activity was acquired at a sampling rate of 500Hz  and reviewed with a high frequency filter of 70Hz  and a low frequency filter of 1Hz . EEG data were recorded continuously and digitally stored.   Description: The posterior dominant rhythm consists of 8-9 Hz activity of moderate voltage (25-35 uV) seen predominantly in posterior head regions, symmetric and reactive to eye opening and eye closing. EEG showed intermittent 3 to 6 Hz theta-delta slowing in left temporal region.   Hyperventilation and photic stimulation were not performed.     ABNORMALITY - Intermittent slow, left temporal region  IMPRESSION: This study is suggestive of cortical dysfunction arising from left temporal region, nonspecific etiology but likely secondary to underlying SDH. No seizures or epileptiform discharges were seen throughout the recording.  Amy [name]

## 2020-08-27 NOTE — Consult Note (Addendum)
Neurology Consultation Reason for Consult: seizure Referring Physician: Dr Delice Lesch  CC: SDH  History is obtained from:chart review, patient  HPI: Amy [name] is a 70 y.o. female with left sdh s/p left parietal craniotomy, seizures on brivateracetam and lacosamide, NSTEMI s/p PCI w/DAPT, ESRD on PD who was admitted with rehab services. Neurology was consulted today due to history of seizures and for recommendation regarding AED dosing.   Seizure history: Per chart review patient was seen on 06/19/2020 for evaluation of left-sided neglect and some confusion. Per Dr Derenda Fennel note : While on the CT table, she was witnessed to have loud vocalization, full body stiffening, initial upward gaze that later became left lateral gaze, left upper extremity rhythmic jerking, quickly followed by right upper extremity rhythmic and resolved after approximately 90 seconds. She received 2 mg of Ativan with cessation of seizures and had snoring respirations following seizure activity. She was also noted to have sodum of 123, C diff infection infection at that time. EEG didn't show any definite epileptiform discharges. However, patient was discharged on Keppra. She continued on keppra until the recent admission at [facility] where it was switched to brivateracetam and vimpat was added for focal seizures   Currently patient denies any side effects on AEDs. However, per Dr patel patient's husband expressed concern that her cognition has been poor and was told that the seizure medications would likely be weaned/stopped in future.   EEG  Impression 07/27/2020:  Seizures: Definitive electrographic seizures present during the  recording. SIRPIDs were not present. Seizure characteristics were  as follows:   Seizure #1: 2778 3/27  Location: L posterior temporal  Duration: 6 minutes  Description of evolution: L posterior temporal rhythmic delta  activity with spread to the anterior region with brief evolution  to theta  activity  Clinical correlation: none  Seizure #2: 11:32- similar morphology, no clinical correlate   Impression: This EEG is abnormal due to:  1) Two electrographic seizures, most recent at 11:32 3/27  2) Diffuse background slowing  3) Left sided focal slowing with breach artifact    Current AEDS: BRV 75mg  BID, LCM 75mg  BID  [facility] wo contrast 07/25/2020: High attenuation extra-axial hemorrhage extending across the left cerebral convexity with slightly heterogeneous internal attenuation which could reflect a swirl sign reflecting mixed hyperacute, acute and chronic hemorrhagic components. Resulting sulcal effacement across the left cerebral hemisphere as well as near complete effacement the left lateral ventricle. Rightward midline shift of approximately 10 mm. Medialization of the left uncus and rightward shift of the midbrain as well.  [place] wo contrast 07/27/2020: Expected postsurgical changes of the left parietal craniotomy with residual surgical drain and mild left extra-axial fluid present, not appreciably changed from 07/26/18 CT at 23:21.    ROS: All other systems reviewed and negative except as noted in the HPI.   Past Medical History:  Diagnosis Date  . Anemia    a. 08/2016 Guaiac + stool. EGD w/ gastritis.  . Anxiety   . Closed fracture of shaft of left ulna 11/27/2018  . Colon polyp    a. 08/2016 Colonoscopy.  . ESRD on peritoneal dialysis ([place])   . Gastritis    a. 08/2016 EGD: Gastritis-->PPI.  [name] GERD (gastroesophageal reflux disease)   . History of chicken pox   . History of measles as a child   . Hypertension   . Hyponatremia    a. 08/2016 in setting of HCTZ Rx.  . Mesenteric lymphadenopathy 04/08/2017  . Multiple thyroid nodules  Family History  Problem Relation Age of Onset  . Pancreatic cancer Father   . Congestive Heart Failure Maternal Grandmother   . Cancer Paternal Grandfather   . Heart disease Maternal Uncle    Social History:  reports that she has never  smoked. She has never used smokeless tobacco. She reports current alcohol use. She reports that she does not use drugs.  Medications Prior to Admission  Medication Sig Dispense Refill Last Dose  . aspirin 81 MG chewable tablet Chew 1 tablet (81 mg total) by mouth daily.     [name] atorvastatin (LIPITOR) 80 MG tablet TAKE 1 TABLET (80 MG TOTAL) BY MOUTH DAILY AT 6 PM. 30 tablet 0   . butalbital-acetaminophen-caffeine (FIORICET) 50-325-40 MG tablet TAKE 1 TO 2 TABLETS BY MOUTH EVERY 4 HOURS AS NEEDED FOR HEADACHE (Patient taking differently: Take 2 tablets by mouth every 6 (six) hours as needed for headache.) 150 tablet 5   . calcium acetate (PHOSLO) 667 MG capsule Take 667 mg by mouth 3 (three) times daily.     . clopidogrel (PLAVIX) 75 MG tablet TAKE 1 TABLET (75 MG TOTAL) BY MOUTH DAILY. 30 tablet 1   . esomeprazole (NEXIUM) 20 MG capsule Take 20 mg by mouth daily.     [name] gentamicin cream (GARAMYCIN) 0.1 % Apply 1 application topically daily. 30 g 0   . levETIRAcetam (KEPPRA) 500 MG tablet TAKE 1 TABLET (500 MG TOTAL) BY MOUTH TWO TIMES DAILY. 60 tablet 1   . melatonin 3 MG TABS tablet Take 1 tablet (3 mg total) by mouth at bedtime. 30 tablet 0   . metoCLOPramide (REGLAN) 5 MG tablet Take 1 tablet (5 mg total) by mouth daily at 6 (six) AM. 30 tablet 0   . metoprolol succinate (TOPROL-XL) 25 MG 24 hr tablet TAKE 1 TABLET (25 MG TOTAL) BY MOUTH DAILY. 30 tablet 0   . pantoprazole (PROTONIX) 40 MG tablet TAKE 1 TABLET (40 MG TOTAL) BY MOUTH DAILY. 30 tablet 1   . prochlorperazine (COMPAZINE) 10 MG tablet Take 1 tablet (10 mg total) by mouth every 8 (eight) hours as needed for nausea or vomiting. 30 tablet 0   . saccharomyces boulardii (FLORASTOR) 250 MG capsule Take 1 capsule (250 mg total) by mouth 2 (two) times daily. 60 capsule 0   . zolpidem (AMBIEN) 10 MG tablet Take 10 mg by mouth at bedtime as needed for sleep.         Exam: Current vital signs: BP 134/69 (BP Location: Right Arm)   Pulse 76    Temp 98.8 F (37.1 C) (Oral)   Resp 16   Ht 5\' 4"  (1.626 m)   Wt 45.1 kg   SpO2 100%   BMI 17.07 kg/m  Vital signs in last 24 hours: Temp:  [97.8 F (36.6 C)-98.8 F (37.1 C)] 98.8 F (37.1 C) (04/27 1100) Pulse Rate:  [76-82] 76 (04/27 0403) Resp:  [15-17] 16 (04/27 0722) BP: (117-134)/(64-82) 134/69 (04/27 1100) SpO2:  [98 %-100 %] 100 % (04/27 1100) Weight:  [45.1 kg] 45.1 kg (04/26 1620)   Physical Exam  Constitutional: Appears well-developed and well-nourished.  Psych: Affect appropriate to situation Eyes: No scleral injection HENT: No OP obstrucion Head: Normocephalic.  Cardiovascular: Normal rate and regular rhythm.  Respiratory: Effort normal, non-labored breathing GI: Soft.  No distension. There is no tenderness.  Skin: Warm Neuro: Awake, alert, oriented to place, person, time, CN 2-12 grossly intact, 4/5 in all extremities except 3/5 in RLE  I have reviewed labs in epic and the results pertinent to this consultation are: CBC:  Recent Labs  Lab 08/25/20 0448 08/27/20 0514  WBC 5.7 6.5  HGB 9.9* 10.4*  HCT 30.8* 31.1*  MCV 98.1 98.1  PLT 265 893    Basic Metabolic Panel:  Lab Results  Component Value Date   NA 126 (L) 08/27/2020   K 3.1 (L) 08/27/2020   CO2 25 08/27/2020   GLUCOSE 98 08/27/2020   BUN 19 08/27/2020   CREATININE 2.93 (H) 08/27/2020   CALCIUM 8.1 (L) 08/27/2020   GFRNONAA 17 (L) 08/27/2020   GFRAA 7 (L) 12/17/2019   Lipid Panel:  Lab Results  Component Value Date   LDLCALC 68 06/24/2020   HgbA1c: No results found for: HGBA1C Urine Drug Screen:     Component Value Date/Time   LABOPIA NONE DETECTED 12/16/2019 1742   COCAINSCRNUR NONE DETECTED 12/16/2019 1742   LABBENZ NONE DETECTED 12/16/2019 1742   AMPHETMU NONE DETECTED 12/16/2019 1742   THCU NONE DETECTED 12/16/2019 1742   LABBARB POSITIVE (A) 12/16/2019 1742    Alcohol Level     Component Value Date/Time   ETH <10 07/25/2020 0026     I have reviewed the  images obtained:  [place] wo contrast 07/25/2020: High attenuation extra-axial hemorrhage extending across the left cerebral convexity with slightly heterogeneous internal attenuation which could reflect a swirl sign reflecting mixed hyperacute, acute and chronic hemorrhagic components. Resulting sulcal effacement across the left cerebral hemisphere as well as near complete effacement the left lateral ventricle. Rightward midline shift of approximately 10 mm. Medialization of the left uncus and rightward shift of the midbrain as well.  [place] wo contrast 07/27/2020: Expected postsurgical changes of the left parietal craniotomy with residual surgical drain and mild left extra-axial fluid present, not appreciably changed from 07/26/18 CT at 23:21.    ASSESSMENT/PLAN: 70yo f with SDH s/p left parietal craniectomy and seizures who is admitted for rehab.  SDH s/p left parietal craniectomy Epilepsy - Patient had her first seizure in feb 2022 could have been provoked by hyponatremia ( sodium 123). However, since then she has also suffered SDH s/p craniectomy nad had focal seizures. Even though these seizures happened within first 2 weeks of injury, given patient has had seizures before and now had a structural neurological injury, her risk of seizure recurrent is high. - Husband's concern about cognitive changes are more likely multifactorial due to recent sdh, surgery, prolonged hospital stay with medications being a part of it  Recommendations - Obtain routine eeg to assess for icta;-interictal abnormality - As patient has not had any further seizures in almost a month, we will reduce vimpat to 50mg  BID - Continue brivateracetam at 75mg  BID - Recommend follow up with neurology in 8-12 weeks after discharge, if patient remains seizure free, vimpat could likely be further weaned. However, patient would most likely benefit from being atleast on one seizure medication to avoid seizure recurrence - Seizure  precautions - Prn iv ativna 2mg  for GTC>2 mins, focal sz>15 mins - management of rest of comorbidities per primary team   Thank you for allowing [place] to participate in the care of this patient. If you have any further questions, please contact  me or neurohospitalist.   Zeb Comfort Epilepsy [facility] neuro[facility]ist

## 2020-08-27 NOTE — Progress Notes (Signed)
[facility] Progress Note   Subjective:   Pt seen in room, reports 1 episode of emesis after breakfast this AM, nausea now improved. No abdominal pain or issues with PD overnight. Net UF 1.1L. Denies SOB, CP, palpitations, dizziness.   Objective Vitals:   08/26/20 1620 08/26/20 1949 08/27/20 0403 08/27/20 0722  BP: 132/77 128/82 127/69 117/64  Pulse: 81 82 76   Resp: 16 15 16 16   Temp: 98.5 F (36.9 C) 97.8 F (36.6 C) 98.2 F (36.8 C) 98.4 F (36.9 C)  TempSrc: Oral  Oral Oral  SpO2: 100% 98% 99% 100%  Weight: 45.1 kg     Height:       Physical Exam General:Alert, thin female in NAD Heart:RRR, no MRG Lungs: nonlabored breathing, lungs CTA anteriorly Abdomen:Bowel sounds normoactive, soft NTND no ascites, PD catheter present Extremities:No pedal edema Dialysis Access:PD catheter exit site clean dry  Additional Objective Labs: Basic Metabolic Panel: Recent Labs  Lab 08/25/20 0448 08/25/20 1210 08/27/20 0514  NA 127* 127* 126*  K 3.3* 3.7 3.1*  CL 94* 90* 93*  CO2 25 24 25   GLUCOSE 94 108* 98  BUN 19 22 19   CREATININE 2.87* 3.10* 2.93*  CALCIUM 8.3* 8.5* 8.1*  PHOS  --  3.9 4.1   Liver Function Tests: Recent Labs  Lab 08/25/20 1210 08/27/20 0514  ALBUMIN 1.3* <1.0*   CBC: Recent Labs  Lab 08/25/20 0448 08/27/20 0514  WBC 5.7 6.5  HGB 9.9* 10.4*  HCT 30.8* 31.1*  MCV 98.1 98.1  PLT 265 243   Blood Culture    Component Value Date/Time   SDES URINE, CLEAN CATCH 08/15/2020 1209   SPECREQUEST  08/15/2020 1209    NONE Performed at [facility], [place] [address], [place], [place] [address]    CULT MULTIPLE SPECIES PRESENT, SUGGEST RECOLLECTION (A) 08/15/2020 1209   REPTSTATUS 08/16/2020 FINAL 08/15/2020 1209   Medications:  . aspirin EC  81 mg Oral Daily  . atorvastatin  40 mg Oral Daily  . brivaracetam  75 mg Oral BID  . butalbital-acetaminophen-caffeine  1 tablet Oral TID with meals  . citalopram  10 mg Oral Daily   . darbepoetin (ARANESP) injection - NON-DIALYSIS  60 mcg Subcutaneous Q Tue-1800  . docusate sodium  100 mg Oral BID  . gentamicin cream  1 application Topical Daily  . heparin  5,000 Units Subcutaneous Q12H  . kidney failure book   Does not apply Once  . lacosamide  75 mg Oral BID  . melatonin  5 mg Oral QHS  . metoCLOPramide  5 mg Oral TID AC  . metoprolol tartrate  6.25 mg Oral BID  . multivitamin  1 tablet Oral QHS  . pantoprazole  40 mg Oral Daily  . triamcinolone cream   Topical TID    Dialysis Orders: [place] followed by Dr. Candiss Norse ([place]). 4 exchanges overnight, 2000 cc and 1.5 hrs each.  Assessment/Plan: 1.[place], [place], f/b Dr Candiss Norse of [place].Cont PD nightly4x 2000cc. Blood effluent seen on takedown 4/15 but has not had any moresince.PD gram stain, cultures and cell count not consistent with infection. 2.Hypokalemia:intermittent, repleting PRN. K+ 3.1 today, will give another dose of PO KCl 40mEq x 1 3.Vol overload-resolved/HTN-on admitsig LE dependent edema and UE edemaon admission.Volume statusresolvedsince admissionwith PD ,euvolemic on exam.Continues to have good UF with all 1.5% exchanges, will continue.On low-dose p.o. Lopressor 6.25 mg twice daily 4. Debility: 2/2 Multiple med issues, most recently SDH recovery and  NSTEMI w/ CAD and low EF. Readmitted to CIR on 08/04/20. 5.Fall w/ subdural hematoma- sp SDH evacuation at [facility] on3/24/22 6.Anemia of ESRDHgb 10.4, on Aranesp 60 MCG q. Tuesday 7.MBD-phosphorus controlled without binder; corrected calcium mid 10s, no vitamin D 8.Nausea/ vomiting-intermittent,etiology unclear, uremiaunlikely.PD cell count in the past and culture unrevealing, Preliminary report of body fluid culture shows ngtdand effluent has been clear. 9. Malnutrition/ debility-verylow albumin. It has been d/w husband on several occassions in the past about a possible trial of HD due to  low albumin in a PD patient. He had stated in the past they are not interested and feels she would not tolerate HD.RD on board 10.Multivessel CAD- with reduced EF 25-30% by recent cath in Feb 2022. Per TCTSwasnotCABG candidate. UnderwentPCI to LAD. At [facility] repeat echo per family showed improved EF 55%. 11. Chronichyponatremia-stable in high 120s, appears euvolemic.  D/Cd NaCl tabs for now, follow labs.  12. Metabolic acidosis-bicarb has been ok off supplement. 14.H/oSeizures-per primary team  Anice Paganini, PA-C 08/27/2020, 9:57 AM  [facility] Pager: [phone]

## 2020-08-27 NOTE — Progress Notes (Signed)
Occupational Therapy Session Note  Occupational Therapy TBI Note  Agitated Behavior Scale: TBI Observation Details Observation Environment: pt room Start of observation period - Date: 08/27/20 Start of observation period - Time: 1350 End of observation period - Date: 08/27/20 End of observation period - Time: 1442 Agitated Behavior Scale (DO NOT LEAVE BLANKS) Short attention span, easy distractibility, inability to concentrate: Present to a slight degree Impulsive, impatient, low tolerance for pain or frustration: Absent Uncooperative, resistant to care, demanding: Absent Violent and/or threatening violence toward people or property: Absent Explosive and/or unpredictable anger: Absent Rocking, rubbing, moaning, or other self-stimulating behavior: Absent Pulling at tubes, restraints, etc.: Absent Wandering from treatment areas: Absent Restlessness, pacing, excessive movement: Absent Repetitive behaviors, motor, and/or verbal: Absent Rapid, loud, or excessive talking: Absent Sudden changes of mood: Absent Easily initiated or excessive crying and/or laughter: Absent Self-abusiveness, physical and/or verbal: Absent Agitated behavior scale total score: 15  Patient Details  Name: Amy [name] MRN: [id number] Date of Birth: 1951/02/05  Today's Date: 08/27/2020 OT Individual Time: 1352-1431 OT Individual Time Calculation (min): 39 min    Short Term Goals: Week 3:  OT Short Term Goal 1 (Week 3): patient will complete toileting with mod A OT Short Term Goal 1 - Progress (Week 3): Not met OT Short Term Goal 2 (Week 3): patient will complete bed mobility and SPT with min A OT Short Term Goal 2 - Progress (Week 3): Not met OT Short Term Goal 3 (Week 3): patient will complete LB bathing/dressing with min a OT Short Term Goal 3 - Progress (Week 3): Not met  Skilled Therapeutic Interventions/Progress Updates:    Pt received semi-reclined in bed, agreeable to therapy, req to use bathroom.  Came to sitting EOB with mod A to progress BLE off bed + use of bed features. Reports some dizziness, but resides some with time. Squat-pivot transfer to w/c with mod A to lift and pivot hips, heavy cuing for technique. Squat-pivot same manner to toilet with use of grab bar. Total A for LB clothing management via lateral leans. Cont void of bowel. S for seated pericare. Attempted squat-pivot back to w/c, pt with poor initiation. Stedy transfers for STS with + 2 2/2 poor initiation and decreased ability to assist. Dep for LB clothing management. Stedy transfer back to bed and max A to return to supine, pt c/o sacral pain. Mod A to roll R and assisted pt in achieving side lying position.    Pt left side lying in bed with HOB at 30 degrees, bed alarm engaged, call bell in reach, and all immediate needs met.    Therapy Documentation Precautions:  Precautions Precautions: Fall Precaution Comments: seizure precautions Restrictions Weight Bearing Restrictions: No Other Position/Activity Restrictions: head incision Pain: Pain Assessment Pain Scale: 0-10 Pain Score: 0-No pain ADL: See Care Tool for more details.    Therapy/Group: Individual Therapy  Volanda Napoleon MS, OTR/L  08/27/2020, 7:01 AM

## 2020-08-27 NOTE — Progress Notes (Signed)
Speech Language Pathology Daily Session Note  Patient Details  Name: ANAMARIE HUNN MRN: [id number] Date of Birth: [birth year]  Today's Date: 08/27/2020 SLP Individual Time: 1430-1500 SLP Individual Time Calculation (min): 30 min  Short Term Goals: Week 4: SLP Short Term Goal 1 (Week 4): STG=LTG due to short ELOS (5/3)  Skilled Therapeutic Interventions:Skilled SLP intervention focused on cognition. Sustained attention reading task with functional information completed with Min A verbal cues. Pt required increased time to locate answers in newspapers and telephone ads but accurate with responses. She named 10 items in categories of increase complexity with min A verbal cues with descriptions. Pt demonstrated adequate attention during this task. Cont with therapy per plan of care.      Pain Pain Assessment Pain Scale: Faces Pain Score: 0-No pain Faces Pain Scale: No hurt  Therapy/Group: Individual Therapy  Darrol Poke [place] 08/27/2020, 2:56 PM

## 2020-08-27 NOTE — Progress Notes (Addendum)
Physical Therapy Session Note  Amy [name] Details  Name: Amy [name] MRN: [id number] Date of Birth: [birth year]  Today's Date: 08/27/2020 PT Individual Time:Session1: [id number]; [name]: 1500-1550 PT Individual Time Calculation (min): 30 min & 50 min  Short Term Goals: Week 4:  PT Short Term Goal 1 (Week 4): Amy [name] will perform at least 50% of transfers with min A. PT Short Term Goal 2 (Week 4): Amy [name] will ambulate 52' with RW and mod A. PT Short Term Goal 3 (Week 4): Amy [name] will propel w/c 100' with S PT Short Term Goal 4 (Week 4): Amy [name] will stand for 1 minute for functional task with min A.  Skilled Therapeutic Interventions/Progress Updates:    Session1:  Amy [name] missed 30 minutes of skilled PT due to nausea and vomiting just prior to PT session.  RN had medicated, then pt rested in bed.  Amy [name] supine to don ACE wraps with total A.  Felt need to toilet.  Assisted supine to sit with mod/max A.  Seated EOB with S and doffed brief.  Squat pivot to [facility] with mod A.  Amy [name] continent of bowel and bladder.  Sit to stand to RW mod A.  Total A for hygiene and clothing management.  Amy [name] stepped with RW around to recliner with mod A.  Left up in recliner with all needs met and call bell in reach.    Session2:Amy [name] supine and reports still not feeling well.  Not wanting OOB or out of the room.  Performed in bed therex as noted below.  Amy [name] supine to sit to EOB mod A.  Seated EOB working on core strength, sitting balance using yellow theraband weighted ball moving in and out from chest 3 x 5 reps, then moving shoulder to opposite hip on R then on L x 5 each.  Seated with ball toss with lightweight blow up ball x 10 x 2 reps. Amy [name] sit to supine mod A for LE's.  Allowed to rest then supine to sit mod A and scoot pivot towards HOB x 2 reps with mod A.  Sit to supine mod A.  Amy [name] up to semi-chair position in bed.  Doffed ACE wraps and placed pillow to float heels.  Left with call bell and  needs in reach and bed alarm active. Amy [name] missed 10 minutes skilled PT due to fatigue.   Therapy Documentation Precautions:  Precautions Precautions: Fall Precaution Comments: seizure precautions Restrictions Weight Bearing Restrictions: No Other Position/Activity Restrictions: head incision General: PT Amount of Missed Time (min): 30 Minutes PT Missed Treatment Reason: Amy [name] ill (Comment) (nausea and vomiting)  Missed 10 minutes in second session due to fatigue. Vital Signs: Therapy Vitals Temp: 98.8 F (37.1 C) Temp Source: Oral Resp: 16 BP: 134/69 Amy [name] Position (if appropriate): Lying Oxygen Therapy SpO2: 100 % O2 Device: Room Air Pain: Pain Assessment Pain Scale: Faces Pain Score: 0-No pain Faces Pain Scale: No hurt   Exercises: General Exercises - Lower Extremity Ankle Circles/Pumps: AROM;Both;20 reps;Supine Short Arc Quad: Strengthening;Both;10 reps;Supine (5 sec hold) Hip ABduction/ADduction: Strengthening;Both;10 reps;Supine (hooklying with orange t-band) Hip Flexion/Marching: Strengthening;Both;10 reps;Supine (hooklying) Shoulder Exercises Elbow Flexion: Strengthening;Both;10 reps;Supine;Theraband Theraband Level (Elbow Flexion): Level 1 (Yellow) Elbow Extension: Strengthening;Both;10 reps;Supine;Theraband Theraband Level (Elbow Extension): Level 1 (Yellow) Total Joint Exercises Bridges: Strengthening;Both;10 reps;Supine (5 sec hold) Other Exercises Other Exercises: shoulder rows x 10 in long sitting with yellow t-band Other Exercises: core strengthening trunk flexion from semireclined in bed x 10    Therapy/Group: Individual Therapy  Reginia Naas  Magda Kiel, PT 08/27/2020, 11:04 AM

## 2020-08-28 DIAGNOSIS — G479 Sleep disorder, unspecified: Secondary | ICD-10-CM | POA: Diagnosis not present

## 2020-08-28 DIAGNOSIS — R569 Unspecified convulsions: Secondary | ICD-10-CM | POA: Diagnosis not present

## 2020-08-28 DIAGNOSIS — S065X9A Traumatic subdural hemorrhage with loss of consciousness of unspecified duration, initial encounter: Secondary | ICD-10-CM | POA: Diagnosis not present

## 2020-08-28 DIAGNOSIS — D649 Anemia, unspecified: Secondary | ICD-10-CM | POA: Diagnosis not present

## 2020-08-28 DIAGNOSIS — G8918 Other acute postprocedural pain: Secondary | ICD-10-CM | POA: Diagnosis not present

## 2020-08-28 LAB — BODY FLUID CELL COUNT WITH DIFFERENTIAL: Total Nucleated Cell Count, Fluid: 3 cu mm (ref 0–1000)

## 2020-08-28 NOTE — Progress Notes (Signed)
Patient ID: Amy [name], female   DOB: 18-Aug-1950, 70 y.o.   MRN: [id number] Spoke with husband regarding team conference progress. He did talk with Dr. Posey Pronto yesterday regarding his concerns about wife's meds and her lethargy, participation in therapies and discharge plan. Husband feels he can not take her home like this even if he hires private duty assist. He feels she will need to go to a facility. Which means her PD will need to be switched to HD. She was on this before and went to [facility] in [place]. He will talk with pt about this since all she talks about is going home. Will get private duty list and SNF list for husband and leave in pt's room. Discussed if plan is SNF then will need to change to HD and find a OP facility and chair slot and also SNF bed. MD to follow up with husband, but he is off tomorrow so possibly over the weekend or Monday.

## 2020-08-28 NOTE — Progress Notes (Addendum)
Subjective: NAEO.  ROS: negative except above  Examination  Vital signs in last 24 hours: Temp:  [97.6 F (36.4 C)-99.7 F (37.6 C)] 97.6 F (36.4 C) (04/28 0337) Pulse Rate:  [79-97] 79 (04/28 0337) Resp:  [16] 16 (04/28 0337) BP: (117-132)/(69-77) 128/74 (04/28 0337) SpO2:  [99 %-100 %] 99 % (04/28 0337)  General: lying in bed, NAD CVS: pulse-normal rate and rhythm RS: breathing comfortably Extremities: normal  Neuro: Awake, alert, oriented to place, person, time, CN 2-12 grossly intact, 4/5 in all extremities except 3/5 in RLE  Basic Metabolic Panel: Recent Labs  Lab 08/23/20 0437 08/24/20 0423 08/25/20 0448 08/25/20 1210 08/27/20 0514  NA 127* 126* 127* 127* 126*  K 3.6 4.9 3.3* 3.7 3.1*  CL 93* 95* 94* 90* 93*  CO2 23 20* 25 24 25   GLUCOSE 83 80 94 108* 98  BUN 22 22 19 22 19   CREATININE 2.79* 2.66* 2.87* 3.10* 2.93*  CALCIUM 8.1* 8.1* 8.3* 8.5* 8.1*  PHOS  --   --   --  3.9 4.1    CBC: Recent Labs  Lab 08/25/20 0448 08/27/20 0514  WBC 5.7 6.5  HGB 9.9* 10.4*  HCT 30.8* 31.1*  MCV 98.1 98.1  PLT 265 243     Coagulation Studies: No results for input(s): LABPROT, INR in the last 72 hours.  Imaging No new brain imaging overnight  ASSESSMENT AND PLAN: 70yo f with SDH s/p left parietal craniectomy and seizures who is admitted for rehab.  SDH s/p left parietal craniectomy Epilepsy - Patient had her first seizure in feb 2022 could have been provoked by hyponatremia ( sodium 123). However, since then she has also suffered SDH s/p craniectomy nad had focal seizures. Even though these seizures happened within first 2 weeks of injury, given patient has had seizures before and now had a structural neurological injury, her risk of seizure recurrent is high. - Husband's concern about cognitive changes are more likely multifactorial due to recent sdh, surgery, prolonged hospital stay with medications being a part of it - Routine eeg didn't show any evidence of  epileptogenicity  Recommendations - Continue vimpat  50mg  BID and brivateracetam at 75mg  BID - Recommend follow up with neurology in 8-12 weeks after discharge, if patient remains seizure free, vimpat could likely be further weaned. However, patient would most likely benefit from being atleast on one seizure medication to avoid seizure recurrence - Seizure precautions - Prn iv ativna 2mg  for GTC>2 mins, focal sz>15 mins - management of rest of comorbidities per primary team  Thank you for allowing [place] to participate in the care of this patient. Neurology will sign off. If you have any further questions, please contact  me or neurohospitalist.   I have spent a total of 25  minutes with the patient reviewing hospital notes,  test results, labs and examining the patient as well as establishing an assessment and plan.  > 50% of time was spent in direct patient care.   Zeb Comfort Epilepsy [facility] For questions after 5pm please refer to AMION to reach the Neurologist on call

## 2020-08-28 NOTE — Progress Notes (Signed)
Physical Therapy Session Note  Patient Details  Name: COLTON ENGDAHL MRN: [id number] Date of Birth: [birth year]  Today's Date: 08/28/2020 PT Individual Time: 1132-1158 PT Individual Time Calculation (min): 26 min   Short Term Goals: Week 4:  PT Short Term Goal 1 (Week 4): Patient will perform at least 50% of transfers with min A. PT Short Term Goal 2 (Week 4): Patient will ambulate 70' with RW and mod A. PT Short Term Goal 3 (Week 4): Patient will propel w/c 100' with S PT Short Term Goal 4 (Week 4): Patient will stand for 1 minute for functional task with min A.  Skilled Therapeutic Interventions/Progress Updates:     Pt received seated in recliner and agrees to therapy. No complaint of pain. Pt performs x3 reps of sit to stand with modA and RW. PT provides multimodal cues for hand placement and body mechanics, as well as increasing anterior weight shifting and use of momentum. In standing, PT cues pt for increased hip extension, scapular retraction, and upright gaze to improve posture and balance. Pt attempts marching in place and is able to complete x3 steps. Pt attempts additional stand but is unable to complete due to muscular fatigue. Pt completes 1x15 LAQs and hip external rotation with level 2 theraband resistance. Pt performs seated scooting with cues for head hips relationship, with modA. Pt left seated in recliner with alarm intact and all needs within reach.  Therapy Documentation Precautions:  Precautions Precautions: Fall Precaution Comments: seizure precautions Restrictions Weight Bearing Restrictions: No Other Position/Activity Restrictions: head incision  Therapy/Group: Individual Therapy  Breck Coons, PT, DPT 08/28/2020, 12:29 PM

## 2020-08-28 NOTE — Progress Notes (Signed)
Speech Language Pathology Daily Session Note  Patient Details  Name: Amy [name] MRN: [id number] Date of Birth: [birth year]  Today's Date: 08/28/2020 SLP Individual Time: 1445-1520 SLP Individual Time Calculation (min): 35 min  Short Term Goals: Week 4: SLP Short Term Goal 1 (Week 4): STG=LTG due to short ELOS (5/3)  Skilled Therapeutic Interventions:   Patient seen for skilled ST session focusing on cognitive function goals. Patient reported that she tired and had been nauseated today. She completed mild complex calendar task and required modA cues overall, sometimes mod-maxA for attention and active problem solving. She would not request assistance and often would seem to be staring at paper but not actively engaging in activity. She asked if discharge date was still for 5/3 and SLP did not discuss with her recent note from social work which stated patient's husband does not feel that it is feasible for him to take care of her at home even with hired aides. Patient continues to benefit from skilled SLP intervention to maximize cognitive-linguistic function prior to discharge.  Pain Pain Assessment Pain Scale: 0-10 Pain Score: 0-No pain  Therapy/Group: Individual Therapy  Sonia Baller, MA, CCC-SLP Speech Therapy

## 2020-08-28 NOTE — Progress Notes (Signed)
Occupational Therapy Session Note  Patient Details  Name: Amy [name] MRN: [id number] Date of Birth: 1950/05/04  Today's Date: 08/28/2020 OT Individual Time: 8335-8251 OT Individual Time Calculation (min): 60 min    Short Term Goals: Week 4:  OT Short Term Goal 1 (Week 4): Patient will complete bed mobility with min A OT Short Term Goal 2 (Week 4): patient will complete toileting with mod A OT Short Term Goal 3 (Week 4): patient will complete sit to stand and SPT with RW consistent min/mod A  Skilled Therapeutic Interventions/Progress Updates:    Patient in bed, on bed pan.  Dependent to remove bed pan, min A for hygiene and dependent to place clean brief.  She is able to roll in bed using bed rails with CS and increased time.  She notes HA and occ nausea - nursing provided medication during session.  Dependent to apply ace wraps and slipper socks, mod A to donn pants at bed level, clothing management via rolling side to side with mod A.  Side lying to sitting edge of bed with min/mod A.  Min A to scoot forward on bed.  Sit to stand mod A for elevated bed surface (poor initiation to stand), CGA to complete SPT with RW to w/c with cues and increased time.  Completed oral care/grooming tasks w/c level with set up/CS.  Upper body bathing set up, donns OH shirt min A.  Ambulation with RW approx 6 feet w/c to recliner requires mod/max A to stand and min A for posture and walker management.  She completed light trunk activity seated in recliner.  Patient is pleasant and cooperative t/o session, requires mild encouragement to complete tasks, impaired recall and initiation.  She remained seated in recliner at close of session, seat belt alarm set and call bell in hand.    Therapy Documentation Precautions:  Precautions Precautions: Fall Precaution Comments: seizure precautions Restrictions Weight Bearing Restrictions: No Other Position/Activity Restrictions: head incision   Therapy/Group:  Individual Therapy  Carlos Levering 08/28/2020, 7:44 AM

## 2020-08-28 NOTE — Progress Notes (Signed)
[facility] Progress Note   Subjective:   Seen in room - finishing up PD, UF ~1L. No abdominal pain or issues with PD overnight. Denies SOB, CP, palpitations, dizziness.   Objective Vitals:   08/27/20 1820 08/27/20 1900 08/27/20 2002 08/28/20 0337  BP: 132/76 117/69 117/69 128/74  Pulse:  88 87 79  Resp: 16 16  16   Temp: 97.9 F (36.6 C) 99.7 F (37.6 C)  97.6 F (36.4 C)  TempSrc: Oral Oral    SpO2: 100% 100%  99%  Weight:      Height:       Physical Exam General:Alert, thin female in NAD Heart:RRR, no MRG Lungs: nonlabored breathing, lungs CTA anteriorly Abdomen:Bowel sounds normoactive, soft NTND no ascites, PD catheter present Extremities:No pedal edema Dialysis Access:PD catheter exit site clean dry  Additional Objective Labs: Basic Metabolic Panel: Recent Labs  Lab 08/25/20 0448 08/25/20 1210 08/27/20 0514  NA 127* 127* 126*  K 3.3* 3.7 3.1*  CL 94* 90* 93*  CO2 25 24 25   GLUCOSE 94 108* 98  BUN 19 22 19   CREATININE 2.87* 3.10* 2.93*  CALCIUM 8.3* 8.5* 8.1*  PHOS  --  3.9 4.1   Liver Function Tests: Recent Labs  Lab 08/25/20 1210 08/27/20 0514  ALBUMIN 1.3* <1.0*   CBC: Recent Labs  Lab 08/25/20 0448 08/27/20 0514  WBC 5.7 6.5  HGB 9.9* 10.4*  HCT 30.8* 31.1*  MCV 98.1 98.1  PLT 265 243   Blood Culture    Component Value Date/Time   SDES URINE, CLEAN CATCH 08/15/2020 1209   SPECREQUEST  08/15/2020 1209    NONE Performed at [facility], [place] [address], [place], [place] [address]    CULT MULTIPLE SPECIES PRESENT, SUGGEST RECOLLECTION (A) 08/15/2020 1209   REPTSTATUS 08/16/2020 FINAL 08/15/2020 1209   Medications: . dialysis solution 1.5% low-MG/low-CA     . aspirin EC  81 mg Oral Daily  . atorvastatin  40 mg Oral Daily  . brivaracetam  75 mg Oral BID  . butalbital-acetaminophen-caffeine  1 tablet Oral TID with meals  . citalopram  10 mg Oral Daily  . darbepoetin (ARANESP) injection - NON-DIALYSIS  60  mcg Subcutaneous Q Tue-1800  . docusate sodium  100 mg Oral BID  . gentamicin cream  1 application Topical Daily  . heparin  5,000 Units Subcutaneous Q12H  . kidney failure book   Does not apply Once  . lacosamide  50 mg Oral BID  . melatonin  5 mg Oral QHS  . metoCLOPramide  5 mg Oral TID AC  . metoprolol tartrate  6.25 mg Oral BID  . multivitamin  1 tablet Oral QHS  . pantoprazole  40 mg Oral Daily  . triamcinolone cream   Topical TID    Dialysis Orders: [place] followed by Dr. Candiss Norse ([place]). 4 exchanges overnight, 2000 cc and 1.5 hrs each.  Assessment/Plan: 1.[facility]RD[place], [place], f/b Dr Candiss Norse of [place].Cont PD nightly4x 2000cc. Blood effluent seen on takedown 4/15 but has not had any moresince.PD gram stain, cultures and cell count not consistent with infection. 2.Hypokalemia:intermittent, repleting PRN. Labs tomorrowAM. 3.Vol overload-resolved/HTN-on admitsig LE dependent edema and UE edemaon admission.Volume statusresolvedsince admissionwith PD ,euvolemic on exam.Continues to have good UF with all 1.5% exchanges, will continue.On low-dose p.o. Lopressor 6.25 mg twice daily 4. Debility: 2/2 Multiple med issues, most recently SDH recovery and NSTEMI w/ CAD and low EF. Readmitted to CIR on 08/04/20. 5.Fall w/ subdural hematoma- sp SDH  evacuation at [facility] on3/24/22 6.Anemia of ESRDHgb 10.4, on Aranesp 60 MCG q. Tuesday 7.MBD-phosphorus controlled without binder; corrected calcium mid 10s, no vitamin D 8.Nausea/ vomiting-intermittent,etiology unclear, uremiaunlikely.PD cell count in the past and culture unrevealing, Preliminary report of body fluid culture shows ngtdand effluent has been clear. 9. Malnutrition/ debility-verylow albumin. It has been d/w husband on several occassions in the past about a possible trial of HD due to low albumin in a PD patient. He had stated in the past they are not interested and feels she  would not tolerate HD.RD on board 10.Multivessel CAD- with reduced EF 25-30% by recent cath in Feb 2022. Per TCTSwasnotCABG candidate. UnderwentPCI to LAD. At [facility] repeat echo per family showed improved EF 55%. 11. Chronichyponatremia-stable in high 120s, appears euvolemic.  D/Cd NaCl tabs for now, follow labs.   12. Metabolic acidosis-bicarb has been ok off supplement. 14.H/oSeizures-per primary team  [name] MD [facility] Pager [phone]

## 2020-08-28 NOTE — Progress Notes (Signed)
Physical Therapy Session Note  Patient Details  Name: Amy [name] MRN: [id number] Date of Birth: 08-08-1950  Today's Date: 08/28/2020 PT Individual Time: 1345-1430 PT Individual Time Calculation (min): 45 min   Short Term Goals: Week 4:  PT Short Term Goal 1 (Week 4): Patient will perform at least 50% of transfers with min A. PT Short Term Goal 2 (Week 4): Patient will ambulate 73' with RW and mod A. PT Short Term Goal 3 (Week 4): Patient will propel w/c 100' with S PT Short Term Goal 4 (Week 4): Patient will stand for 1 minute for functional task with min A.  Skilled Therapeutic Interventions/Progress Updates:    Patient in supine and reports not feeling well.  Encouraged to participate as much as able.  Patient on bed pan.  Rolled and assisted with total A for hygiene and clothing management.  Patient supine to sit with mod A at EOB for balance work, but reports needs to return to supine due to nausea worse.  Patient sit to supine mod A for LE's.  Patient supine for BP measurement 134/72.  Returned to sitting with mod A and cues and BP 130/71.  Patient scooted to [facility] with mod A/max A lateral scoot pivots.    In supine pt performed UE chest press with yellow theraband weighted ball x 2 x 5, then moving ball L to R for core strength with UE's x 2 x 5.  Supine bridge w/ 5 sec hold x 10.  Performed SAQ x 10 w/ 5 sec hold.  Performed SLR w/ A x 10, hip abduction x 10, hip adductor squeeze x 10 w/ 5 sec hold.  LE's on therapy ball performed lateral trunk rolls x 10, then bridge over ball w/ 3 sec hold x 10.  Removed ace wraps and pt left in semi-chair position with call bell in reach and bed alarm active.   Therapy Documentation Precautions:  Precautions Precautions: Fall Precaution Comments: seizure precautions Restrictions Weight Bearing Restrictions: No Other Position/Activity Restrictions: head incision Pain: Pain Assessment Pain Scale: 0-10 Pain Score: 0-No pain   Therapy/Group:  Individual Therapy  Reginia Naas  Magda Kiel, PT 08/28/2020, 8:17 AM

## 2020-08-28 NOTE — Progress Notes (Signed)
[place] PHYSICAL MEDICINE & REHABILITATION PROGRESS NOTE  Subjective/Complaints: Patient seen sitting up in bed this morning.  She states she slept well overnight.  She denies any complaints.  Discussed with husband concerns regarding medications and cognitive side effects.  Discussed with neurology as well-decreased Vimpat.  ROS: Denies CP, SOB, nausea, vomiting, diarrhea  Objective: Vital Signs: Blood pressure 128/74, pulse 79, temperature 97.6 F (36.4 C), resp. rate 16, height 5\' 4"  (1.626 m), weight 45.1 kg, SpO2 99 %. EEG adult  Result Date: 08/27/2020 [name], MD     08/27/2020  6:57 PM Patient Name: DENELL COTHERN MRN: [id number] Epilepsy Attending: [name] Referring Physician/Provider: Dr Zeb Comfort Date: 08/27/2020 Duration: 23.24 mins Patient history: Level of alertness: Awake AEDs during EEG study: BRV, LCM Technical aspects: This EEG study was done with scalp electrodes positioned according to the 10-20 International system of electrode placement. Electrical activity was acquired at a sampling rate of 500Hz  and reviewed with a high frequency filter of 70Hz  and a low frequency filter of 1Hz . EEG data were recorded continuously and digitally stored. Description: The posterior dominant rhythm consists of 8-9 Hz activity of moderate voltage (25-35 uV) seen predominantly in posterior head regions, symmetric and reactive to eye opening and eye closing. EEG showed intermittent 3 to 6 Hz theta-delta slowing in left temporal region.   Hyperventilation and photic stimulation were not performed.   ABNORMALITY - Intermittent slow, left temporal region IMPRESSION: This study is suggestive of cortical dysfunction arising from left temporal region, nonspecific etiology but likely secondary to underlying SDH. No seizures or epileptiform discharges were seen throughout the recording. [name]   Recent Labs    08/27/20 0514  WBC 6.5  HGB 10.4*  HCT 31.1*  PLT 243    Recent Labs    08/27/20 0514  NA 126*  K 3.1*  CL 93*  CO2 25  GLUCOSE 98  BUN 19  CREATININE 2.93*  CALCIUM 8.1*    Intake/Output Summary (Last 24 hours) at 08/28/2020 1346 Last data filed at 08/28/2020 0729 Gross per 24 hour  Intake 418 ml  Output --  Net 418 ml        Physical Exam: BP 128/74 (BP Location: Right Arm)   Pulse 79   Temp 97.6 F (36.4 C)   Resp 16   Ht 5\' 4"  (1.626 m)   Wt 45.1 kg   SpO2 99%   BMI 17.07 kg/m  Constitutional: No distress . Vital signs reviewed. HENT: Normocephalic.  Atraumatic. Eyes: EOMI. No discharge. Cardiovascular: No JVD.  RRR. Respiratory: Normal effort.  No stridor.  Bilateral clear to auscultation. GI: Non-distended.  BS +.  + PD site. Skin: Warm and dry.  Intact. Psych: Normal mood.  Normal behavior. Musc: No edema in extremities.  No tenderness in extremities. Neuro: Alert Motor: Motor: LUE: 4-4+/5 proximal distal, stable RUE: 4/5 proximal distal LLE: Hip flexion 4-/5, knee extension 4-/5, ankle dorsiflexion 4-/5, unchanged RLE: Hip flexion 3+/5, knee extension 3+/5, ankle dorsiflexion 3-3+/5, stable  Assessment/Plan: 1. Functional deficits which require 3+ hours per day of interdisciplinary therapy in a comprehensive inpatient rehab setting.  Physiatrist is providing close team supervision and 24 hour management of active medical problems listed below.  Physiatrist and rehab team continue to assess barriers to discharge/monitor patient progress toward functional and medical goals   Care Tool:  Bathing    Body parts bathed by patient: Right arm,Left arm,Chest,Abdomen,Front perineal area,Face,Right upper leg,Left upper leg,Buttocks,Right lower  leg,Left lower leg   Body parts bathed by helper: Right lower leg,Left lower leg,Buttocks     Bathing assist Assist Level: Moderate Assistance - Patient 50 - 74%     Upper Body Dressing/Undressing Upper body dressing   What is the patient wearing?: Pull over  shirt    Upper body assist Assist Level: Minimal Assistance - Patient > 75%    Lower Body Dressing/Undressing Lower body dressing      What is the patient wearing?: Pants     Lower body assist Assist for lower body dressing: Moderate Assistance - Patient 50 - 74%     Toileting Toileting    Toileting assist Assist for toileting: Total Assistance - Patient < 25%     Transfers Chair/bed transfer  Transfers assist     Chair/bed transfer assist level: Moderate Assistance - Patient 50 - 74% Chair/bed transfer assistive device: Therapist, sports   Ambulation assist   Ambulation activity did not occur: Safety/medical concerns  Assist level: 2 helpers Assistive device: Walker-rolling Max distance: 15'   Walk 10 feet activity   Assist  Walk 10 feet activity did not occur: Safety/medical concerns  Assist level: 2 helpers Assistive device: Walker-rolling   Walk 50 feet activity   Assist Walk 50 feet with 2 turns activity did not occur: Safety/medical concerns         Walk 150 feet activity   Assist Walk 150 feet activity did not occur: Safety/medical concerns         Walk 10 feet on uneven surface  activity   Assist Walk 10 feet on uneven surfaces activity did not occur: Safety/medical concerns         Wheelchair     Assist Will patient use wheelchair at discharge?: Yes Type of Wheelchair: Manual    Wheelchair assist level: Supervision/Verbal cueing Max wheelchair distance: 40'    Wheelchair 50 feet with 2 turns activity    Assist    Wheelchair 50 feet with 2 turns activity did not occur: Safety/medical concerns   Assist Level: Supervision/Verbal cueing   Wheelchair 150 feet activity     Assist  Wheelchair 150 feet activity did not occur: Safety/medical concerns        Medical Problem List and Plan: 1.  Right hemiparesis and aphasia secondary to large left SDH s/p craniotomy  Continue  CIR  Discussed with husband, who states he would like to decrease antiepileptic and monitor for cognitive improvement.  Educated husband that would require significantly more assistance at discharge than she required on last visit.  If no improvement with decreasing antiepileptic, would need to consider SNF, as patient does not appear to have appropriate support at discharge.  If this is the plan, would need to convert to HD- husband agreeable. 2.  Impaired mobility -DVT/anticoagulation:  Mechanical: Continue Sequential compression devices, below knee Bilateral lower extremities             -antiplatelet therapy: N/A due to bleed.  3. Chronic migraines/ Pain Management: Continue Fioricet 3-4 times a day on average. Added prn Fioricet q8H  Controlled with meds on 4/28  Monitor with increased exertion 4. Mood: LCSW to follow for evaluation and support.  -antipsychotic agents: N/A 5. Neuropsych: This patient is not fully capable of making decisions on her own behalf. 6. Skin/Wound Care: Monitor wound for healing.             -Family to provide meals from home as able.  -bilateral PRAFOs ordered  but patient refuses to wear 7. Fluids/Electrolytes/Nutrition: Continue PD--does not need fluid restriction.              --has had chronic issues with variable intake as well as N/V likely due to gastroparesis.  8. Chronic Hypotension: ContinueTEDs when out of bed             Vitals:   08/27/20 2002 08/28/20 0337  BP: 117/69 128/74  Pulse: 87 79  Resp:  16  Temp:  97.6 F (36.4 C)  SpO2:  99%   Controlled on 4/28 9.  ESRD on PD: Nephrology to assist/manage PD.  10.  Recurrent Seizures: Has been seizure free, continue Brivaracetam and Lacosamide bid.              --Lacosamide was decreased on 03/31 to avoid sedative SE, decreased again on 4/28- appreciate neurology recommendations..  No breakthrough seizures since admission- 4/28 11. CAD/NSTEMI s/p PCI: Treated medically.   Off DAPT due to bleed    Continue Lipitor.   Metoprolol low-dose resumed  Heart rate controlled on 4/28 12. Acute on chronic anemia:   Hemoglobin 10.4 on 4/27  Continue to monitor 13. H/o Depression: Continue Celexa 14. Sick Euthyroid: Will need follow up as illness resolves.              --TSH- 5.80/Free T4-1.2 (03/27) 15. Gastroparesis w/chronic N/V/anorexia: Has been ongoing per records review.              --question failure of PD (husband reports no N/V with brief episode of HD in the past)  Continue Reglan 5 3 times daily  Hesitant to continue increase medications due to #18  Controlled for the most part 16.  Hyponatremia             --chronic hyponatremia with Na-130-133 range  Sodium 127 on 4/23             --Continue Sodium bicarb to manage metabolic acidosis. 17.  Severe hypoalbuminemia  Supplement d/ced given nausea and vomiting- recommended nuts as source of protein. Her daughter has brought in.  18.  Prolonged QTC  ECG reviewed, improved since last visit  Compazine and dose/frequency decreased 19.  Sleep disturbance- "never a good sleeper" , on PD a night and gets disturbed by hospital sounds, cont trazodone and melatonin    Reasonably controlled 20.  Hypokalemia  Potassium 3.1 on 4/27  Supplementation per nephrology  LOS: 24 days A FACE TO FACE EVALUATION WAS PERFORMED  Dwight Adamczak Lorie Phenix 08/28/2020, 1:46 PM

## 2020-08-29 ENCOUNTER — Other Ambulatory Visit: Payer: Self-pay | Admitting: Physical Medicine and Rehabilitation

## 2020-08-29 DIAGNOSIS — S065X9A Traumatic subdural hemorrhage with loss of consciousness of unspecified duration, initial encounter: Secondary | ICD-10-CM | POA: Diagnosis not present

## 2020-08-29 LAB — RENAL FUNCTION PANEL
Albumin: 1.2 g/dL — ABNORMAL LOW (ref 3.5–5.0)
Anion gap: 13 (ref 5–15)
BUN: 17 mg/dL (ref 8–23)
CO2: 22 mmol/L (ref 22–32)
Calcium: 8.3 mg/dL — ABNORMAL LOW (ref 8.9–10.3)
Chloride: 92 mmol/L — ABNORMAL LOW (ref 98–111)
Creatinine, Ser: 2.8 mg/dL — ABNORMAL HIGH (ref 0.44–1.00)
GFR, Estimated: 18 mL/min — ABNORMAL LOW (ref >60)
Glucose, Bld: 86 mg/dL (ref 70–99)
Phosphorus: 4.5 mg/dL (ref 2.5–4.6)
Potassium: 3.5 mmol/L (ref 3.5–5.1)
Sodium: 127 mmol/L — ABNORMAL LOW (ref 135–145)

## 2020-08-29 LAB — PATHOLOGIST SMEAR REVIEW

## 2020-08-29 NOTE — Progress Notes (Signed)
Occupational Therapy Session Note  Patient Details  Name: Amy [name] MRN: [id number] Date of Birth: 01-08-1951  Today's Date: 08/29/2020 OT Individual Time: 1400-1440 OT Individual Time Calculation (min): 40 min   Short Term Goals: Week 4:  OT Short Term Goal 1 (Week 4): Patient will complete bed mobility with min A OT Short Term Goal 2 (Week 4): patient will complete toileting with mod A OT Short Term Goal 3 (Week 4): patient will complete sit to stand and SPT with RW consistent min/mod A  Skilled Therapeutic Interventions/Progress Updates:    Pt greeted semi-reclined in bed and agreeable to OT treatment session. Pt reported feeling nauseous and fatigued. Pt reported she has not been out of bed much today due to not feeling well. OT asked pt if she needed to wash up or get dressed which cued pt to report that she was on the bed pan. Pt unsure how long she had been on bed pan. Pt completed rolling to R with min A for bed pan removal and total A peri-care. Pt had successful void of bladder and smear of BM. With encouragement, pt agreeable to sit EOB. Mod A to get to EOB. UB there-ex in sitting 1 set of 10, chest press, triceps press, seated row, and bicep curl- utilized level 1 yellow theraband and level 2 orange as well. Pt completed 2 sit<>stands with RW and mod A with posterior lean in standing. Pt reported feeling dizzy and needed to lay back down. OT tried to encourage pt to participate further, but she refused. Pt returned to supine with max A to lift BLEs back in bed. Pt able to assist with pulling self up in bed using handrails and bed placed in trendelenburg. Pt left semi-reclined in bed with bed alarm on, call bell in reach, and needs met.   Therapy Documentation Precautions:  Precautions Precautions: Fall Precaution Comments: seizure precautions Restrictions Weight Bearing Restrictions: No Other Position/Activity Restrictions: head incision General: General OT Amount of Missed  Time: 20 Minutes Pain: Pain Assessment Faces Pain Scale: Hurts a little bit Pain Type: Chronic pain Pain Location: Head Pain Descriptors / Indicators: Discomfort Pain Onset: On-going Pain Intervention(s): Distraction   Therapy/Group: Individual Therapy  Valma Cava 08/29/2020, 2:44 PM

## 2020-08-29 NOTE — Progress Notes (Signed)
Physical Therapy Session Note  Patient Details  Name: Amy [name] MRN: [id number] Date of Birth: 04-10-1951  Today's Date: 08/29/2020 PT Individual Time: 5329-9242 PT Individual Time Calculation (min): 68 min   Short Term Goals: Week 4:  PT Short Term Goal 1 (Week 4): Patient will perform at least 50% of transfers with min A. PT Short Term Goal 2 (Week 4): Patient will ambulate 45' with RW and mod A. PT Short Term Goal 3 (Week 4): Patient will propel w/c 100' with S PT Short Term Goal 4 (Week 4): Patient will stand for 1 minute for functional task with min A.  Skilled Therapeutic Interventions/Progress Updates:    Patient in supine, reports not feeling well, didn't sleep well.  Patient encouraged to participate as able.  Applied ace wraps in supine and patient supine to sit with mod A.  Donned pants in sitting with max A with sit to stand to RW.  Patient ambulated to w/c x 8' with RW and mod to max A with +2 for safety.  Patient seated at sink to brush teeth with min A.  Patient requesting to toilet so transferred to [facility] with RW stand pivot mod A with total A +2 for toileting with clothing management and hygiene.  Patient stand step to recliner with max A and cues for stepping, assist to keep walker close.  Patient seated in recliner performed pedal exerciser with arms 2' fwd and 2' reverse.  Attempted with feet, but limited due to positioning in chair so performed LAQ, hip abduction with yellow t-band, hamstring curls with yellow t-band, hip flexion and hip adductor squeezes with 5 sec hold all x 10 reps in chair.  Left seated in recliner with alarm belt active and needs in reach.   Therapy Documentation Precautions:  Precautions Precautions: Fall Precaution Comments: seizure precautions Restrictions Weight Bearing Restrictions: No Other Position/Activity Restrictions: head incision Pain: Pain Assessment Pain Scale: 0-10 Pain Score: 0-No pain Faces Pain Scale: Hurts a little bit Pain  Type: Chronic pain Pain Location: Head Pain Descriptors / Indicators: Discomfort Pain Onset: On-going Pain Intervention(s): Distraction   Therapy/Group: Individual Therapy  Reginia Naas  Magda Kiel, PT 08/29/2020, 11:29 AM

## 2020-08-29 NOTE — Progress Notes (Signed)
[place] PHYSICAL MEDICINE & REHABILITATION PROGRESS NOTE  Subjective/Complaints: No complaints this morning Watching TV Vitals stable  ROS: Denies CP, SOB, nausea, vomiting, diarrhea  Objective: Vital Signs: Blood pressure 132/76, pulse 87, temperature 98.3 F (36.8 C), temperature source Oral, resp. rate 15, height 5\' 4"  (1.626 m), weight 44.1 kg, SpO2 99 %. EEG adult  Result Date: 08/27/2020 Amy Havens, MD     08/27/2020  6:57 PM Patient Name: Amy [name] MRN: [id number] Epilepsy Attending: Lora [name] Referring Physician/Provider: Dr Zeb Comfort Date: 08/27/2020 Duration: 23.24 mins Patient history: Level of alertness: Awake AEDs during EEG study: BRV, LCM Technical aspects: This EEG study was done with scalp electrodes positioned according to the 10-20 International system of electrode placement. Electrical activity was acquired at a sampling rate of 500Hz  and reviewed with a high frequency filter of 70Hz  and a low frequency filter of 1Hz . EEG data were recorded continuously and digitally stored. Description: The posterior dominant rhythm consists of 8-9 Hz activity of moderate voltage (25-35 uV) seen predominantly in posterior head regions, symmetric and reactive to eye opening and eye closing. EEG showed intermittent 3 to 6 Hz theta-delta slowing in left temporal region.   Hyperventilation and photic stimulation were not performed.   ABNORMALITY - Intermittent slow, left temporal region IMPRESSION: This study is suggestive of cortical dysfunction arising from left temporal region, nonspecific etiology but likely secondary to underlying SDH. No seizures or epileptiform discharges were seen throughout the recording. Amy [name]   Recent Labs    08/27/20 0514  WBC 6.5  HGB 10.4*  HCT 31.1*  PLT 243   Recent Labs    08/27/20 0514 08/29/20 0534  NA 126* 127*  K 3.1* 3.5  CL 93* 92*  CO2 25 22  GLUCOSE 98 86  BUN 19 17  CREATININE 2.93* 2.80*  CALCIUM 8.1*  8.3*    Intake/Output Summary (Last 24 hours) at 08/29/2020 1239 Last data filed at 08/29/2020 1132 Gross per 24 hour  Intake 600 ml  Output 2 ml  Net 598 ml        Physical Exam: BP 132/76 (BP Location: Right Arm)   Pulse 87   Temp 98.3 F (36.8 C) (Oral)   Resp 15   Ht 5\' 4"  (1.626 m)   Wt 44.1 kg   SpO2 99%   BMI 16.69 kg/m  Gen: no distress, normal appearing HEENT: oral mucosa pink and moist, NCAT Cardio: Reg rate Chest: normal effort, normal rate of breathing Abd: soft, non-distended Ext: no edema Psych: pleasant, normal affect GI: Non-distended.  BS +.  + PD site. Skin: Warm and dry.  Intact. Psych: Normal mood.  Normal behavior. Musc: No edema in extremities.  No tenderness in extremities. Neuro: Alert Motor: Motor: LUE: 4-4+/5 proximal distal, stable RUE: 4/5 proximal distal LLE: Hip flexion 4-/5, knee extension 4-/5, ankle dorsiflexion 4-/5, unchanged RLE: Hip flexion 3+/5, knee extension 3+/5, ankle dorsiflexion 3-3+/5, stable  Assessment/Plan: 1. Functional deficits which require 3+ hours per day of interdisciplinary therapy in a comprehensive inpatient rehab setting.  Physiatrist is providing close team supervision and 24 hour management of active medical problems listed below.  Physiatrist and rehab team continue to assess barriers to discharge/monitor patient progress toward functional and medical goals   Care Tool:  Bathing    Body parts bathed by patient: Right arm,Left arm,Chest,Abdomen,Front perineal area,Face,Right upper leg,Left upper leg,Buttocks,Right lower leg,Left lower leg   Body parts bathed by helper: Right lower leg,Left lower  leg,Buttocks     Bathing assist Assist Level: Moderate Assistance - Patient 50 - 74%     Upper Body Dressing/Undressing Upper body dressing   What is the patient wearing?: Pull over shirt    Upper body assist Assist Level: Minimal Assistance - Patient > 75%    Lower Body Dressing/Undressing Lower body  dressing      What is the patient wearing?: Pants     Lower body assist Assist for lower body dressing: Moderate Assistance - Patient 50 - 74%     Toileting Toileting    Toileting assist Assist for toileting: 2 Helpers     Transfers Chair/bed transfer  Transfers assist     Chair/bed transfer assist level: Moderate Assistance - Patient 50 - 74% Chair/bed transfer assistive device: Walker,Armrests   Locomotion Ambulation   Ambulation assist   Ambulation activity did not occur: Safety/medical concerns  Assist level: 2 helpers Assistive device: Walker-rolling Max distance: 8'   Walk 10 feet activity   Assist  Walk 10 feet activity did not occur: Safety/medical concerns  Assist level: 2 helpers Assistive device: Walker-rolling   Walk 50 feet activity   Assist Walk 50 feet with 2 turns activity did not occur: Safety/medical concerns         Walk 150 feet activity   Assist Walk 150 feet activity did not occur: Safety/medical concerns         Walk 10 feet on uneven surface  activity   Assist Walk 10 feet on uneven surfaces activity did not occur: Safety/medical concerns         Wheelchair     Assist Will patient use wheelchair at discharge?: Yes Type of Wheelchair: Manual    Wheelchair assist level: Supervision/Verbal cueing Max wheelchair distance: 40'    Wheelchair 50 feet with 2 turns activity    Assist    Wheelchair 50 feet with 2 turns activity did not occur: Safety/medical concerns   Assist Level: Supervision/Verbal cueing   Wheelchair 150 feet activity     Assist  Wheelchair 150 feet activity did not occur: Safety/medical concerns        Medical Problem List and Plan: 1.  Right hemiparesis and aphasia secondary to large left SDH s/p craniotomy  Continue CIR  Discussed with husband, who states he would like to decrease antiepileptic and monitor for cognitive improvement.  Educated husband that would require  significantly more assistance at discharge than she required on last visit.  If no improvement with decreasing antiepileptic, would need to consider SNF, as patient does not appear to have appropriate support at discharge.  If this is the plan, would need to convert to HD- husband agreeable. 2.  Impaired mobility -DVT/anticoagulation:  Mechanical: Continue Sequential compression devices, below knee Bilateral lower extremities             -antiplatelet therapy: N/A due to bleed.  3. Chronic migraines/ Pain Management: Continue Fioricet 3-4 times a day on average. Added prn Fioricet q8H  Controlled with meds on 4/29  Monitor with increased exertion 4. Mood: LCSW to follow for evaluation and support.  -antipsychotic agents: N/A 5. Neuropsych: This patient is not fully capable of making decisions on her own behalf. 6. Skin/Wound Care: Monitor wound for healing.             -Family to provide meals from home as able.  -bilateral PRAFOs ordered but patient refuses to wear 7. Fluids/Electrolytes/Nutrition: Continue PD--does not need fluid restriction.              --  has had chronic issues with variable intake as well as N/V likely due to gastroparesis.  8. Chronic Hypotension: ContinueTEDs when out of bed             Vitals:   08/29/20 0500 08/29/20 0646  BP: 128/79 132/76  Pulse: 82 87  Resp: 14 15  Temp: 97.8 F (36.6 C) 98.3 F (36.8 C)  SpO2: 100% 99%   Controlled on 4/29, improved 9.  ESRD on PD: Nephrology to assist/manage PD.  10.  Recurrent Seizures: Has been seizure free, continue Brivaracetam and Lacosamide bid.              --Lacosamide was decreased on 03/31 to avoid sedative SE, decreased again on 4/28- appreciate neurology recommendations..  No breakthrough seizures since admission- 4/29 11. CAD/NSTEMI s/p PCI: Treated medically.   Off DAPT due to bleed   Continue Lipitor.   Metoprolol low-dose resumed  Heart rate controlled on 4/29, continue to monitor.  12. Acute on  chronic anemia:   Hemoglobin 10.4 on 4/27  Continue to monitor 13. H/o Depression: Continue Celexa 14. Sick Euthyroid: Will need follow up as illness resolves.              --TSH- 5.80/Free T4-1.2 (03/27) 15. Gastroparesis w/chronic N/V/anorexia: Has been ongoing per records review.              --question failure of PD (husband reports no N/V with brief episode of HD in the past)  Continue Reglan 5 3 times daily  Hesitant to continue increase medications due to #18  Controlled for the most part 16.  Hyponatremia             --chronic hyponatremia with Na-130-133 range  Sodium 127 on 4/23             --Continue Sodium bicarb to manage metabolic acidosis. 17.  Severe hypoalbuminemia  Supplement d/ced given nausea and vomiting- recommended nuts as source of protein. Her daughter has brought in.  18.  Prolonged QTC  ECG reviewed, improved since last visit  Compazine and dose/frequency decreased 19.  Sleep disturbance- "never a good sleeper" , on PD a night and gets disturbed by hospital sounds, cont trazodone and melatonin    Reasonably controlled 20.  Hypokalemia  Potassium 3.1 on 4/27  Supplementation per nephrology  LOS: 25 days A FACE TO FACE EVALUATION WAS [place] 08/29/2020, 12:39 PM

## 2020-08-29 NOTE — Progress Notes (Signed)
Speech Language Pathology Daily Session Note  Patient Details  Name: Amy [name] MRN: [id number] Date of Birth: Apr 16, 1951  Today's Date: 08/29/2020 SLP Individual Time: 1500-1530 SLP Individual Time Calculation (min): 30 min  Short Term Goals: Week 4: SLP Short Term Goal 1 (Week 4): STG=LTG due to short ELOS (5/3)  Skilled Therapeutic Interventions:Skilled ST services focused on cognitive skills. Pt orientated to all but day of the week. SLP facilitated recall of today's events, pt recalled 1 out 2 events and 2 out 2 events with verbal cues with 1 minute delay. Pt demonstrated recall of 5 items items in 3 categories with 1 minute delay and improvement of recall when able to initially view list requiring min A verbal cues. Pt was left in room with call bell within reach and bed alarm set. Recommend to continue ST services.      Pain Pain Assessment Faces Pain Scale: Hurts a little bit Pain Type: Chronic pain Pain Location: Head Pain Descriptors / Indicators: Discomfort Pain Onset: On-going Pain Intervention(s): Distraction  Therapy/Group: Individual Therapy  Tiwan Schnitker  [facility] 08/29/2020, 1:00 PM

## 2020-08-29 NOTE — Progress Notes (Signed)
[name] Progress Note   Subjective:   Seen in room working with PT.  PD RN noted blood tinged PD fluid yesterday and today - cell count ok yesterday. No fevers, chills, abd pain, bloody BM, straining to have BM or vaginal bleeding. UF ~1L.   Denies SOB, CP, palpitations, dizziness.   Objective Vitals:   08/28/20 1900 08/28/20 2042 08/29/20 0500 08/29/20 0646  BP: 139/77 117/68 128/79 132/76  Pulse:  82 82 87  Resp: 16 16 14 15   Temp: 98.7 F (37.1 C) 98.1 F (36.7 C) 97.8 F (36.6 C) 98.3 F (36.8 C)  TempSrc: Oral   Oral  SpO2:  100% 100% 99%  Weight:    44.1 kg  Height:       Physical Exam General:Alert, thin female in NAD Heart:RRR, no MRG Lungs: nonlabored breathing, lungs CTA anteriorly Abdomen:Bowel sounds normoactive, soft NTND no ascites, PD catheter present Extremities:No pedal edema Dialysis Access:PD catheter exit site clean dry  Additional Objective Labs: Basic Metabolic Panel: Recent Labs  Lab 08/25/20 1210 08/27/20 0514 08/29/20 0534  NA 127* 126* 127*  K 3.7 3.1* 3.5  CL 90* 93* 92*  CO2 24 25 22   GLUCOSE 108* 98 86  BUN 22 19 17   CREATININE 3.10* 2.93* 2.80*  CALCIUM 8.5* 8.1* 8.3*  PHOS 3.9 4.1 4.5   Liver Function Tests: Recent Labs  Lab 08/25/20 1210 08/27/20 0514 08/29/20 0534  ALBUMIN 1.3* <1.0* 1.2*   CBC: Recent Labs  Lab 08/25/20 0448 08/27/20 0514  WBC 5.7 6.5  HGB 9.9* 10.4*  HCT 30.8* 31.1*  MCV 98.1 98.1  PLT 265 243   Blood Culture    Component Value Date/Time   SDES FLUID 08/28/2020 1007   SPECREQUEST PERITONEAL DIALYSIS 08/28/2020 1007   CULT  08/28/2020 1007    NO GROWTH < 24 HOURS Performed at [facility], [place] [address] [place], [place] [address]    REPTSTATUS PENDING 08/28/2020 1007   Medications: . dialysis solution 1.5% low-MG/low-CA     . aspirin EC  81 mg Oral Daily  . atorvastatin  40 mg Oral Daily  . brivaracetam  75 mg Oral BID  .  butalbital-acetaminophen-caffeine  1 tablet Oral TID with meals  . citalopram  10 mg Oral Daily  . darbepoetin (ARANESP) injection - NON-DIALYSIS  60 mcg Subcutaneous Q Tue-1800  . docusate sodium  100 mg Oral BID  . gentamicin cream  1 application Topical Daily  . heparin  5,000 Units Subcutaneous Q12H  . kidney failure book   Does not apply Once  . lacosamide  50 mg Oral BID  . melatonin  5 mg Oral QHS  . metoCLOPramide  5 mg Oral TID AC  . metoprolol tartrate  6.25 mg Oral BID  . multivitamin  1 tablet Oral QHS  . pantoprazole  40 mg Oral Daily  . triamcinolone cream   Topical TID    Dialysis Orders: [facility] followed by Dr. Candiss Norse ([place]). 4 exchanges overnight, 2000 cc and 1.5 hrs each.  Assessment/Plan: 1.ESRD-ONCCPDin Amy [name], [place], f/b Dr Candiss Norse of [place].Cont PD nightly4x 2000cc. Blood effluent seen on takedown 4/15 and again 4/28 and 4/29. Not receiving heparin with PD. PD gram stain, cultures and cell count not consistent with infection.  Trend H/H.  Consider CT a/p if gross blood noted to Hb trending down.   2.Hypokalemia:intermittent, repleting PRN. Labs tomorrowAM. 3.Vol overload-resolved/HTN-on admitsig LE dependent edema and UE edemaon admission.Volume statusresolvedsince admissionwith PD ,euvolemic  on exam.Continues to have good UF with all 1.5% exchanges, will continue.On low-dose p.o. Lopressor 6.25 mg twice daily 4. Debility: 2/2 Multiple med issues, most recently SDH recovery and NSTEMI w/ CAD and low EF. Readmitted to CIR on 08/04/20. 5.Fall w/ subdural hematoma- sp SDH evacuation at [facility] on3/24/22 6.Anemia of ESRDHgb 10.4, on Aranesp 60 MCG q. Tuesday 7.MBD-phosphorus controlled without binder; corrected calcium mid 10s, no vitamin D 8.Nausea/ vomiting-intermittent,etiology unclear, uremiaunlikely.PD cell count in the past and culture unrevealing 9. Malnutrition/ debility-verylow albumin. It has been d/w husband  on several occassions in the past about a possible trial of HD due to low albumin in a PD patient. He had stated in the past they are not interested and feels she would not tolerate HD.RD on board 10.Multivessel CAD- with reduced EF 25-30% by recent cath in Feb 2022. Per TCTSwasnotCABG candidate. UnderwentPCI to LAD. At [facility] repeat echo per family showed improved EF 55%. 11. Chronichyponatremia-stable in high 120s, appears euvolemic.  D/Cd NaCl tabs for now, follow labs.   12. Metabolic acidosis-bicarb has been ok off supplement. 14.H/oSeizures-per primary team - vimpat was reduced 4/29 for side effects.  [name] MD [facility] Pager [phone]

## 2020-08-29 NOTE — Progress Notes (Signed)
Patient ID: Amy [name], female   DOB: 29-Nov-1950, 70 y.o.   MRN: [id number]  Met with pt to discuss how she feels she is doing and what her and her husband have discussed about upcoming discharge. She voiced he talked about her changing ot HD over PD and needing care. Worker explained she will need 24/7 physical care and gave him a list regarding hiring CNA. She feels no one needs to be there while she sleeps. Discussed the amount of care she is requiring. Did broach the subject of SNF and she will talk with husband about but feels can be managed at home. She and husband need to discuss plan and she needs to agree to going to a SNF from here.

## 2020-08-30 DIAGNOSIS — S065X9A Traumatic subdural hemorrhage with loss of consciousness of unspecified duration, initial encounter: Secondary | ICD-10-CM | POA: Diagnosis not present

## 2020-08-30 LAB — RENAL FUNCTION PANEL
Albumin: 1.2 g/dL — ABNORMAL LOW (ref 3.5–5.0)
Anion gap: 11 (ref 5–15)
BUN: 18 mg/dL (ref 8–23)
CO2: 24 mmol/L (ref 22–32)
Calcium: 8.3 mg/dL — ABNORMAL LOW (ref 8.9–10.3)
Chloride: 91 mmol/L — ABNORMAL LOW (ref 98–111)
Creatinine, Ser: 2.9 mg/dL — ABNORMAL HIGH (ref 0.44–1.00)
GFR, Estimated: 17 mL/min — ABNORMAL LOW (ref >60)
Glucose, Bld: 88 mg/dL (ref 70–99)
Phosphorus: 4.5 mg/dL (ref 2.5–4.6)
Potassium: 3.3 mmol/L — ABNORMAL LOW (ref 3.5–5.1)
Sodium: 126 mmol/L — ABNORMAL LOW (ref 135–145)

## 2020-08-30 LAB — CBC
HCT: 32.4 % — ABNORMAL LOW (ref 36.0–46.0)
Hemoglobin: 10.8 g/dL — ABNORMAL LOW (ref 12.0–15.0)
MCH: 32.2 pg (ref 26.0–34.0)
MCHC: 33.3 g/dL (ref 30.0–36.0)
MCV: 96.7 fL (ref 80.0–100.0)
Platelets: 230 10*3/uL (ref 150–400)
RBC: 3.35 MIL/uL — ABNORMAL LOW (ref 3.87–5.11)
RDW: 18.5 % — ABNORMAL HIGH (ref 11.5–15.5)
WBC: 10.3 10*3/uL (ref 4.0–10.5)
nRBC: 0 % (ref 0.0–0.2)

## 2020-08-30 MED ORDER — POTASSIUM CHLORIDE CRYS ER 20 MEQ PO TBCR: 40.0000 meq | EXTENDED_RELEASE_TABLET | Freq: Once | ORAL | Status: DC

## 2020-08-30 MED ORDER — DELFLEX-LC/1.5% DEXTROSE 344 MOSM/L IP SOLN: INTRAPERITONEAL | Status: DC

## 2020-08-30 MED ORDER — POTASSIUM CHLORIDE CRYS ER 20 MEQ PO TBCR
20.0000 meq | EXTENDED_RELEASE_TABLET | Freq: Two times a day (BID) | ORAL | Status: AC
  Administered 2020-08-30 (×2): 20 meq via ORAL
  Filled 2020-08-30 (×2): qty 1

## 2020-08-30 MED ORDER — GENTAMICIN SULFATE 0.1 % EX CREA
1.0000 "application " | TOPICAL_CREAM | Freq: Every day | CUTANEOUS | Status: DC
  Administered 2020-08-30 – 2020-09-15 (×16): 1 via TOPICAL
  Filled 2020-08-30: qty 15

## 2020-08-30 MED ORDER — POTASSIUM CHLORIDE CRYS ER 20 MEQ PO TBCR
20.0000 meq | EXTENDED_RELEASE_TABLET | Freq: Every day | ORAL | Status: DC
  Administered 2020-08-31 – 2020-09-04 (×5): 20 meq via ORAL
  Filled 2020-08-30 (×5): qty 1

## 2020-08-30 NOTE — Progress Notes (Signed)
Amy [name] Progress Note   Subjective:   Feeling well this AM, denies SOB, CP, palpitations, abdominal pain, N/V. Reports effluent was clear this AM.   Objective Vitals:   08/29/20 1750 08/29/20 2023 08/30/20 0314 08/30/20 0659  BP: 131/74 138/80 131/74 134/80  Pulse: 91 83 75   Resp: 16 19 14 14   Temp: 98.2 F (36.8 C) 98.6 F (37 C) 98 F (36.7 C) 97.9 F (36.6 C)  TempSrc: Oral Oral Oral Oral  SpO2: 99% 99% 98% 100%  Weight: 46 kg     Height:       Physical Exam  General:Alert, thin female in NAD Heart:RRR, no MRG Lungs:nonlabored breathing, lungs CTA anteriorly Abdomen:Bowel sounds normoactive, soft NTND no ascites, PD catheter present Extremities:No pedal edema Dialysis Access:PD catheter exit site clean dry   Additional Objective Labs: Basic Metabolic Panel: Recent Labs  Lab 08/27/20 0514 08/29/20 0534 08/30/20 0538  NA 126* 127* 126*  K 3.1* 3.5 3.3*  CL 93* 92* 91*  CO2 25 22 24   GLUCOSE 98 86 88  BUN 19 17 18   CREATININE 2.93* 2.80* 2.90*  CALCIUM 8.1* 8.3* 8.3*  PHOS 4.1 4.5 4.5   Liver Function Tests: Recent Labs  Lab 08/27/20 0514 08/29/20 0534 08/30/20 0538  ALBUMIN <1.0* 1.2* 1.2*   CBC: Recent Labs  Lab 08/25/20 0448 08/27/20 0514 08/30/20 0538  WBC 5.7 6.5 10.3  HGB 9.9* 10.4* 10.8*  HCT 30.8* 31.1* 32.4*  MCV 98.1 98.1 96.7  PLT 265 243 230   Blood Culture    Component Value Date/Time   SDES FLUID 08/28/2020 1007   SPECREQUEST PERITONEAL DIALYSIS 08/28/2020 1007   CULT  08/28/2020 1007    NO GROWTH 2 DAYS Performed at [facility] [address], [place], [place] [address]    REPTSTATUS PENDING 08/28/2020 1007   Medications: . dialysis solution 1.5% low-MG/low-CA     . aspirin EC  81 mg Oral Daily  . atorvastatin  40 mg Oral Daily  . brivaracetam  75 mg Oral BID  . butalbital-acetaminophen-caffeine  1 tablet Oral TID with meals  . citalopram  10 mg Oral Daily  . darbepoetin  (ARANESP) injection - NON-DIALYSIS  60 mcg Subcutaneous Q Tue-1800  . docusate sodium  100 mg Oral BID  . gentamicin cream  1 application Topical Daily  . heparin  5,000 Units Subcutaneous Q12H  . kidney failure book   Does not apply Once  . lacosamide  50 mg Oral BID  . melatonin  5 mg Oral QHS  . metoCLOPramide  5 mg Oral TID AC  . metoprolol tartrate  6.25 mg Oral BID  . multivitamin  1 tablet Oral QHS  . pantoprazole  40 mg Oral Daily  . triamcinolone cream   Topical TID    Dialysis Orders: [place] followed by Dr. Candiss Norse ([place]). 4 exchanges overnight, 2000 cc and 1.5 hrs each.  Assessment/Plan:  1.ESRD-ONCCPDin [place], [place], f/b Dr Candiss Norse of [place].Cont PD nightly4x 2000cc. Blood effluent seen on takedown 4/15 and again 4/28 and 4/29. Not receiving heparin with PD. PD gram stain, cultures and cell count not consistent with infection.  Trend H/H.  Consider CT a/p if gross blood noted to Hb trending down.   2.Hypokalemia:intermittent, repleting PRN. K+ 3.3, will give another dose of PO KCl 12mEq BID then start potassium chloride 71mEq daily.  3.Vol overload-resolved/HTN-on admitsig LE dependent edema and UE edemaon admission.Volume statusresolvedsince admissionwith PD ,euvolemic on exam.Continues to have  good UF with all 1.5% exchanges, will continue.On low-dose p.o. Lopressor 6.25 mg twice daily 4. Debility: 2/2 Multiple med issues, most recently SDH recovery and NSTEMI w/ CAD and low EF. Readmitted to CIR on 08/04/20. 5.Fall w/ subdural hematoma- sp SDH evacuation at [facility] on3/24/22 6.Anemia of ESRDHgb 10.8- trending up, on Aranesp 60 MCG q. Tuesday 7.MBD-phosphorus controlled without binder; corrected calcium mid 10s, no vitamin D 8.Nausea/ vomiting-intermittent,etiology unclear, uremiaunlikely.PD cell count in the past and culture unrevealing 9. Malnutrition/ debility-verylow albumin. It has been d/w husband on several occassions  in the past about a possible trial of HD due to low albumin in a PD patient. He had stated in the past they are not interested and feels she would not tolerate HD.RD on board 10.Multivessel CAD- with reduced EF 25-30% by recent cath in Feb 2022. Per TCTSwasnotCABG candidate. UnderwentPCI to LAD. At [facility] repeat echo per family showed improved EF 55%. 11. Chronichyponatremia-stable in high 120s, appears euvolemic. D/CdNaCl tabs for now and sodium has remained stable, follow labs.   12. Metabolic acidosis-bicarbhas been ok off supplement. 14.H/oSeizures-per primary team - vimpat was reduced 4/29 for side effects.   Anice Paganini, PA-C 08/30/2020, 9:04 AM  [name] Pager: [phone]

## 2020-08-30 NOTE — Progress Notes (Signed)
[place] PHYSICAL MEDICINE & REHABILITATION PROGRESS NOTE  Subjective/Complaints: Sleepy No complaints this morning  ROS: Denies CP, SOB, nausea, vomiting, diarrhea, +intermittent headache  Objective: Vital Signs: Blood pressure 134/80, pulse 75, temperature 97.9 F (36.6 C), temperature source Oral, resp. rate 14, height 5\' 4"  (1.626 m), weight 46 kg, SpO2 100 %. No results found. Recent Labs    08/30/20 0538  WBC 10.3  HGB 10.8*  HCT 32.4*  PLT 230   Recent Labs    08/29/20 0534 08/30/20 0538  NA 127* 126*  K 3.5 3.3*  CL 92* 91*  CO2 22 24  GLUCOSE 86 88  BUN 17 18  CREATININE 2.80* 2.90*  CALCIUM 8.3* 8.3*    Intake/Output Summary (Last 24 hours) at 08/30/2020 1127 Last data filed at 08/30/2020 0930 Gross per 24 hour  Intake 680 ml  Output 153 ml  Net 527 ml        Physical Exam: BP 134/80 (BP Location: Right Arm)   Pulse 75   Temp 97.9 F (36.6 C) (Oral)   Resp 14   Ht 5\' 4"  (1.626 m)   Wt 46 kg   SpO2 100%   BMI 17.41 kg/m  Gen: no distress, normal appearing HEENT: oral mucosa pink and moist, NCAT Cardio: Reg rate Chest: normal effort, normal rate of breathing GI: Non-distended.  BS +.  + PD site. Skin: Warm and dry.  Intact. Psych: Normal mood.  Normal behavior. Musc: No edema in extremities.  No tenderness in extremities. Neuro: Alert Motor: Motor: LUE: 4-4+/5 proximal distal, stable RUE: 4/5 proximal distal LLE: Hip flexion 4-/5, knee extension 4-/5, ankle dorsiflexion 4-/5, unchanged RLE: Hip flexion 3+/5, knee extension 3+/5, ankle dorsiflexion 3-3+/5, stable  Assessment/Plan: 1. Functional deficits which require 3+ hours per day of interdisciplinary therapy in a comprehensive inpatient rehab setting.  Physiatrist is providing close team supervision and 24 hour management of active medical problems listed below.  Physiatrist and rehab team continue to assess barriers to discharge/monitor patient progress toward functional and  medical goals   Care Tool:  Bathing    Body parts bathed by patient: Right arm,Left arm,Chest,Abdomen,Front perineal area,Face,Right upper leg,Left upper leg,Buttocks,Right lower leg,Left lower leg   Body parts bathed by helper: Right lower leg,Left lower leg,Buttocks     Bathing assist Assist Level: Moderate Assistance - Patient 50 - 74%     Upper Body Dressing/Undressing Upper body dressing   What is the patient wearing?: Pull over shirt    Upper body assist Assist Level: Minimal Assistance - Patient > 75%    Lower Body Dressing/Undressing Lower body dressing      What is the patient wearing?: Pants     Lower body assist Assist for lower body dressing: Moderate Assistance - Patient 50 - 74%     Toileting Toileting    Toileting assist Assist for toileting: 2 Helpers     Transfers Chair/bed transfer  Transfers assist     Chair/bed transfer assist level: Moderate Assistance - Patient 50 - 74% Chair/bed transfer assistive device: Walker,Armrests   Locomotion Ambulation   Ambulation assist   Ambulation activity did not occur: Safety/medical concerns  Assist level: 2 helpers Assistive device: Walker-rolling Max distance: 8'   Walk 10 feet activity   Assist  Walk 10 feet activity did not occur: Safety/medical concerns  Assist level: 2 helpers Assistive device: Walker-rolling   Walk 50 feet activity   Assist Walk 50 feet with 2 turns activity did not occur: Safety/medical concerns  Walk 150 feet activity   Assist Walk 150 feet activity did not occur: Safety/medical concerns         Walk 10 feet on uneven surface  activity   Assist Walk 10 feet on uneven surfaces activity did not occur: Safety/medical concerns         Wheelchair     Assist Will patient use wheelchair at discharge?: Yes Type of Wheelchair: Manual    Wheelchair assist level: Supervision/Verbal cueing Max wheelchair distance: 68'    Wheelchair 50  feet with 2 turns activity    Assist    Wheelchair 50 feet with 2 turns activity did not occur: Safety/medical concerns   Assist Level: Supervision/Verbal cueing   Wheelchair 150 feet activity     Assist  Wheelchair 150 feet activity did not occur: Safety/medical concerns        Medical Problem List and Plan: 1.  Right hemiparesis and aphasia secondary to large left SDH s/p craniotomy  Continue CIR  Discussed with husband, who states he would like to decrease antiepileptic and monitor for cognitive improvement.  Educated husband that would require significantly more assistance at discharge than she required on last visit.  If no improvement with decreasing antiepileptic, would need to consider SNF, as patient does not appear to have appropriate support at discharge.  If this is the plan, would need to convert to HD- husband agreeable. 2.  Impaired mobility -DVT/anticoagulation:  Mechanical: Continue Sequential compression devices, below knee Bilateral lower extremities             -antiplatelet therapy: N/A due to bleed.  3. Chronic migraines/ Pain Management: Continue Fioricet 3-4 times a day on average. Added prn Fioricet q8H  Controlled with meds on 4/30  Monitor with increased exertion 4. Mood: LCSW to follow for evaluation and support.  -antipsychotic agents: N/A 5. Neuropsych: This patient is not fully capable of making decisions on her own behalf. 6. Skin/Wound Care: Monitor wound for healing.             -Family to provide meals from home as able.  -bilateral PRAFOs ordered but patient refuses to wear 7. Fluids/Electrolytes/Nutrition: Continue PD--does not need fluid restriction.              --has had chronic issues with variable intake as well as N/V likely due to gastroparesis.  8. Chronic Hypotension: ContinueTEDs when out of bed             Vitals:   08/30/20 0314 08/30/20 0659  BP: 131/74 134/80  Pulse: 75   Resp: 14 14  Temp: 98 F (36.7 C) 97.9 F  (36.6 C)  SpO2: 98% 100%   Controlled on 4/29, improved 9.  ESRD on PD: Nephrology to assist/manage PD.  10.  Recurrent Seizures: Has been seizure free, continue Brivaracetam and Lacosamide bid.              --Lacosamide was decreased on 03/31 to avoid sedative SE, decreased again on 4/28- appreciate neurology recommendations..  No breakthrough seizures since admission- 4/29 11. CAD/NSTEMI s/p PCI: Treated medically.   Off DAPT due to bleed   Continue Lipitor.   Metoprolol low-dose resumed  Heart rate controlled on 4/29, continue to monitor.  12. Acute on chronic anemia:   Hemoglobin 10.4 on 4/27  Continue to monitor 13. H/o Depression: Continue Celexa 14. Sick Euthyroid: Will need follow up as illness resolves.              --TSH- 5.80/Free  T4-1.2 (03/27) 15. Gastroparesis w/chronic N/V/anorexia: Has been ongoing per records review.              --question failure of PD (husband reports no N/V with brief episode of HD in the past)  Continue Reglan 5 3 times daily  Hesitant to continue increase medications due to #18  Controlled for the most part 16.  Hyponatremia             --chronic hyponatremia with Na-130-133 range  Sodium 127 on 4/23, 126 on 4/30, continue management as per nephro, Na tabs have been d/ced             --Continue Sodium bicarb to manage metabolic acidosis. 17.  Severe hypoalbuminemia  Supplement d/ced given nausea and vomiting- recommended nuts as source of protein. Her daughter has brought in.  18.  Prolonged QTC  ECG reviewed, improved since last visit  Compazine and dose/frequency decreased 19.  Sleep disturbance- "never a good sleeper" , on PD a night and gets disturbed by hospital sounds, cont trazodone and melatonin    Reasonably controlled 20.  Hypokalemia  Potassium 3.1 on 4/27  Scheduled supplement ordered.   LOS: 26 days A FACE TO FACE EVALUATION WAS PERFORMED  Clide Deutscher Shanea Karney 08/30/2020, 11:27 AM

## 2020-08-31 DIAGNOSIS — S065X9A Traumatic subdural hemorrhage with loss of consciousness of unspecified duration, initial encounter: Secondary | ICD-10-CM | POA: Diagnosis not present

## 2020-08-31 LAB — BODY FLUID CULTURE W GRAM STAIN
Culture: NO GROWTH
Gram Stain: NONE SEEN

## 2020-08-31 NOTE — Progress Notes (Signed)
Physical Therapy Session Note  Patient Details  Name: Amy [name] MRN: [id number] Date of Birth: 21-Mar-1951  Today's Date: 08/31/2020 PT Individual Time: 1445-1510 PT Individual Time Calculation (min): 25 min   Short Term Goals: Week 4:  PT Short Term Goal 1 (Week 4): Patient will perform at least 50% of transfers with min A. PT Short Term Goal 2 (Week 4): Patient will ambulate 61' with RW and mod A. PT Short Term Goal 3 (Week 4): Patient will propel w/c 100' with S PT Short Term Goal 4 (Week 4): Patient will stand for 1 minute for functional task with min A.  Skilled Therapeutic Interventions/Progress Updates:    Pt received seated in bed handed off from OT session. Pt complaining of headache, OT to let nursing know. Pt agreeable to participate in PT session as able. Seated BP in bed 129/69. Pt declines to don BLE ACE wrap but agreeable to sit up to EOB. Supine to sit with mod A needed for some LE management and trunk elevation. Pt reports feeling dizzy in sitting, seated BP 128/76. Seated BLE therex x 10 reps each: marches, LAQ, hip add squeeze. Pt with R lateral lean in sitting, requires mod A to maintain upright sitting balance. Seated BP following therex 128/70. Pt requests to lay back down due to fatigue. Sit to supine mod A for BLE management. Pt declines to sit up to EOB again or to further participate in therapy session due to fatigue. Pt left supine in bed with needs in reach, bed alarm in place at end of session. Pt missed 20 min of scheduled therapy session due to fatigue.  Therapy Documentation Precautions:  Precautions Precautions: Fall Precaution Comments: seizure precautions Restrictions Weight Bearing Restrictions: No Other Position/Activity Restrictions: head incision General: PT Amount of Missed Time (min): 20 Minutes PT Missed Treatment Reason: Patient fatigue    Therapy/Group: Individual Therapy   Excell Seltzer, PT, DPT, CSRS  08/31/2020, 3:29 PM

## 2020-08-31 NOTE — Progress Notes (Signed)
Occupational Therapy Session Note  Patient Details  Name: Amy [name] MRN: [id number] Date of Birth: 10-02-1950  Today's Date: 08/31/2020 OT Individual Time: 1165-7903 OT Individual Time Calculation (min): 39 min   Skilled Therapeutic Interventions/Progress Updates:    Pt greeted in bed, reporting that ADL needs were met. Initially agreeable to sit EOB during session but declining to attempt standing due to not feeling well and persistent nausea. Stated she threw up earlier today. Supine<sit completed with Max A and vcs for sequencing/problem solving. Note that while scooting forward, pt requested to lie back down, initiated transfer back to supine position. Noticed at this time that her chuck pad had smeared BM. Pt found to be incontinent of bowels. Rolling Rt>Lt completed with Mod A for perihygiene/brief clean. Pt declined sitting up again, requesting to complete exercises bedlevel. With [facility] raised, set her up with resistant clothespin activity with levels 1-3 for hand strengthening and activity tolerance. Note that pt needed to use both hands to grasp the green clothespins. Per pt, the red clothespins were the hardest to grip. At end of session pt remained sitting up in bed, handoff to PT for next therapy.   Therapy Documentation Precautions:  Precautions Precautions: Fall Precaution Comments: seizure precautions Restrictions Weight Bearing Restrictions: No Other Position/Activity Restrictions: head incision Vital Signs: Therapy Vitals Temp: 98.7 F (37.1 C) Temp Source: Oral Pulse Rate: 84 Resp: 15 BP: 130/75 Patient Position (if appropriate): Lying Oxygen Therapy SpO2: 100 % O2 Device: Room Air Pain: c/o HA pain, notified charge RN of pts request for pain medicine   ADL: ADL Eating: Supervision/safety,Set up Where Assessed-Eating: Bed level Grooming: Minimal assistance Where Assessed-Grooming: Bed level Upper Body Bathing: Moderate assistance Where Assessed-Upper Body  Bathing: Bed level Lower Body Bathing: Dependent Where Assessed-Lower Body Bathing: Bed level Upper Body Dressing: Maximal assistance Where Assessed-Upper Body Dressing: Bed level Lower Body Dressing: Dependent Where Assessed-Lower Body Dressing: Bed level Toileting: Dependent Where Assessed-Toileting: Bed level      Therapy/Group: Individual Therapy  [name] 08/31/2020, 3:30 PM

## 2020-08-31 NOTE — Progress Notes (Signed)
[facility] Progress Note   Subjective:   No complaints this AM, denies SOB, CP, palpitations, abdominal pain, nausea, vomiting and diarrhea. PD effluent is clear.   Objective Vitals:   08/30/20 1341 08/30/20 1630 08/30/20 1919 08/31/20 0556  BP: 125/72 128/74 122/70 136/74  Pulse: 84 83 91 77  Resp: 15 16 16 16   Temp: 98 F (36.7 C) 98.8 F (37.1 C) 98.5 F (36.9 C) 99 F (37.2 C)  TempSrc:  Oral    SpO2: 100% 98% 99% 98%  Weight:  44.5 kg    Height:       Physical Exam  General:Alert, thin female in NAD Heart:RRR, no MRG Lungs:nonlabored breathing, lungs CTA bialterally Abdomen:Bowel sounds normoactive, soft NTND no ascites, PD catheter present without erythema/drainage Extremities:No pedal edema Dialysis Access:PD catheter exit site clean dry   Additional Objective Labs: Basic Metabolic Panel: Recent Labs  Lab 08/27/20 0514 08/29/20 0534 08/30/20 0538  NA 126* 127* 126*  K 3.1* 3.5 3.3*  CL 93* 92* 91*  CO2 25 22 24   GLUCOSE 98 86 88  BUN 19 17 18   CREATININE 2.93* 2.80* 2.90*  CALCIUM 8.1* 8.3* 8.3*  PHOS 4.1 4.5 4.5   Liver Function Tests: Recent Labs  Lab 08/27/20 0514 08/29/20 0534 08/30/20 0538  ALBUMIN <1.0* 1.2* 1.2*   No results for input(s): LIPASE, AMYLASE in the last 168 hours. CBC: Recent Labs  Lab 08/25/20 0448 08/27/20 0514 08/30/20 0538  WBC 5.7 6.5 10.3  HGB 9.9* 10.4* 10.8*  HCT 30.8* 31.1* 32.4*  MCV 98.1 98.1 96.7  PLT 265 243 230   Blood Culture    Component Value Date/Time   SDES FLUID 08/28/2020 1007   SPECREQUEST PERITONEAL DIALYSIS 08/28/2020 1007   CULT  08/28/2020 1007    NO GROWTH 3 DAYS Performed at [facility], [place] [address] [place], [place] [address]    REPTSTATUS PENDING 08/28/2020 1007    Cardiac Enzymes: No results for input(s): CKTOTAL, CKMB, CKMBINDEX, TROPONINI in the last 168 hours. CBG: No results for input(s): GLUCAP in the last 168 hours. Iron Studies: No  results for input(s): IRON, TIBC, TRANSFERRIN, FERRITIN in the last 72 hours. @lablastinr3 @ Studies/Results: No results found. Medications: . dialysis solution 1.5% low-MG/low-CA     . aspirin EC  81 mg Oral Daily  . atorvastatin  40 mg Oral Daily  . brivaracetam  75 mg Oral BID  . butalbital-acetaminophen-caffeine  1 tablet Oral TID with meals  . citalopram  10 mg Oral Daily  . darbepoetin (ARANESP) injection - NON-DIALYSIS  60 mcg Subcutaneous Q Tue-1800  . docusate sodium  100 mg Oral BID  . gentamicin cream  1 application Topical Daily  . heparin  5,000 Units Subcutaneous Q12H  . kidney failure book   Does not apply Once  . lacosamide  50 mg Oral BID  . melatonin  5 mg Oral QHS  . metoCLOPramide  5 mg Oral TID AC  . metoprolol tartrate  6.25 mg Oral BID  . multivitamin  1 tablet Oral QHS  . pantoprazole  40 mg Oral Daily  . potassium chloride  20 mEq Oral Daily  . triamcinolone cream   Topical TID    Dialysis Orders:  [place] followed by Dr. Candiss Norse ([place]). 4 exchanges overnight, 2000 cc and 1.5 hrs each.  Assessment/Plan:  1.ESRD-ONCCPDin [place] [facility], [place], f/b Dr Candiss Norse of [place].Cont PD nightly4x 2000cc. Blood effluent seen on takedown 4/15and again 4/28 and 4/29. Not receiving  heparin with PD.PD gram stain, cultures and cell count not consistent with infection. Trend H/H. Consider CT a/p if gross blood noted to Hb trending down.  2.Hypokalemia:intermittent, repleting PRN. Started potassium chloride 62mEq daily.  3.Vol overload-resolved/HTN-on admitsig LE dependent edema and UE edema.Volume statusresolvedsince admissionwith PD ,euvolemic on exam.Continues to have good UF with all 1.5% exchanges, will continue.On low-dose p.o. Lopressor 6.25 mg twice daily 4. Debility: 2/2 Multiple med issues, most recently SDH recovery and NSTEMI w/ CAD and low EF. Readmitted to CIR on 08/04/20. 5.Fall w/ subdural hematoma- sp [facility] at  [facility] on[facility]24[facility] 6.Anemia of ESRDHgb 10.8- trending up, on Aranesp 60 MCG q. Tuesday 7.MBD-phosphorus controlled without binder; corrected calcium mid 10s, no vitamin D 8.Nausea/ vomiting-intermittent,etiology unclear, uremiaunlikely.PD cell count in the past and culture unrevealing 9. Malnutrition/ debility-verylow albumin. It has been d/w husband on several occassions in the past about a possible trial of HD due to low albumin in a PD patient. He had stated in the past they are not interested and feels she would not tolerate HD.RD on board 10.Multivessel CAD- with reduced EF 25-30% by recent cath in Feb 2022. Per TCTSwasnotCABG candidate. UnderwentPCI to LAD. At [facility] repeat echo per family showed improved EF 55%. 11. Chronichyponatremia-stable in high 120s, appears euvolemic. D/CdNaCl tabs for now and sodium has remained stable, follow labs.  12. Metabolic acidosis-bicarbhas been ok off supplement. 14.H/oSeizures-per primary team- vimpat was reduced 4/29 for side effects.   Anice Paganini, PA-C 08/31/2020, 9:11 AM  [facility] Pager: [phone]

## 2020-08-31 NOTE — Progress Notes (Signed)
[place] PHYSICAL MEDICINE & REHABILITATION PROGRESS NOTE  Subjective/Complaints: No complaints this morning. Alert Appreciate nephrology note She is still hopeful she will be able to go home this week  ROS: Denies CP, SOB, nausea, vomiting, diarrhea, +intermittent headache  Objective: Vital Signs: Blood pressure 136/74, pulse 77, temperature 99 F (37.2 C), resp. rate 16, height 5\' 4"  (1.626 m), weight 44.5 kg, SpO2 98 %. No results found. Recent Labs    08/30/20 0538  WBC 10.3  HGB 10.8*  HCT 32.4*  PLT 230   Recent Labs    08/29/20 0534 08/30/20 0538  NA 127* 126*  K 3.5 3.3*  CL 92* 91*  CO2 22 24  GLUCOSE 86 88  BUN 17 18  CREATININE 2.80* 2.90*  CALCIUM 8.3* 8.3*    Intake/Output Summary (Last 24 hours) at 08/31/2020 1210 Last data filed at 08/31/2020 0850 Gross per 24 hour  Intake 8276 ml  Output 9069 ml  Net -793 ml        Physical Exam: BP 136/74 (BP Location: Right Arm)   Pulse 77   Temp 99 F (37.2 C)   Resp 16   Ht 5\' 4"  (1.626 m)   Wt 44.5 kg   SpO2 98%   BMI 16.84 kg/m  Gen: no distress, normal appearing HEENT: oral mucosa pink and moist, NCAT Cardio: Reg rate Chest: normal effort, normal rate of breathing GI: Non-distended.  BS +.  + PD site. Skin: Warm and dry.  Intact. Psych: Normal mood.  Normal behavior. Musc: No edema in extremities.  No tenderness in extremities. Neuro: Alert Motor: Motor: LUE: 4-4+/5 proximal distal, stable RUE: 4/5 proximal distal LLE: Hip flexion 4-/5, knee extension 4-/5, ankle dorsiflexion 4-/5, unchanged RLE: Hip flexion 3+/5, knee extension 3+/5, ankle dorsiflexion 3-3+/5, stable  Assessment/Plan: 1. Functional deficits which require 3+ hours per day of interdisciplinary therapy in a comprehensive inpatient rehab setting.  Physiatrist is providing close team supervision and 24 hour management of active medical problems listed below.  Physiatrist and rehab team continue to assess barriers to  discharge/monitor patient progress toward functional and medical goals   Care Tool:  Bathing    Body parts bathed by patient: Right arm,Left arm,Chest,Abdomen,Front perineal area,Face,Right upper leg,Left upper leg,Buttocks,Right lower leg,Left lower leg   Body parts bathed by helper: Right lower leg,Left lower leg,Buttocks     Bathing assist Assist Level: Moderate Assistance - Patient 50 - 74%     Upper Body Dressing/Undressing Upper body dressing   What is the patient wearing?: Pull over shirt    Upper body assist Assist Level: Minimal Assistance - Patient > 75%    Lower Body Dressing/Undressing Lower body dressing      What is the patient wearing?: Pants     Lower body assist Assist for lower body dressing: Moderate Assistance - Patient 50 - 74%     Toileting Toileting    Toileting assist Assist for toileting: 2 Helpers     Transfers Chair/bed transfer  Transfers assist     Chair/bed transfer assist level: Moderate Assistance - Patient 50 - 74% Chair/bed transfer assistive device: Walker,Armrests   Locomotion Ambulation   Ambulation assist   Ambulation activity did not occur: Safety/medical concerns  Assist level: 2 helpers Assistive device: Walker-rolling Max distance: 8'   Walk 10 feet activity   Assist  Walk 10 feet activity did not occur: Safety/medical concerns  Assist level: 2 helpers Assistive device: Walker-rolling   Walk 50 feet activity   Assist  Walk 50 feet with 2 turns activity did not occur: Safety/medical concerns         Walk 150 feet activity   Assist Walk 150 feet activity did not occur: Safety/medical concerns         Walk 10 feet on uneven surface  activity   Assist Walk 10 feet on uneven surfaces activity did not occur: Safety/medical concerns         Wheelchair     Assist Will patient use wheelchair at discharge?: Yes Type of Wheelchair: Manual    Wheelchair assist level: Supervision/Verbal  cueing Max wheelchair distance: 98'    Wheelchair 50 feet with 2 turns activity    Assist    Wheelchair 50 feet with 2 turns activity did not occur: Safety/medical concerns   Assist Level: Supervision/Verbal cueing   Wheelchair 150 feet activity     Assist  Wheelchair 150 feet activity did not occur: Safety/medical concerns        Medical Problem List and Plan: 1.  Right hemiparesis and aphasia secondary to large left SDH s/p craniotomy  Continue CIR  Discussed with husband, who states he would like to decrease antiepileptic and monitor for cognitive improvement.  Educated husband that would require significantly more assistance at discharge than she required on last visit.  If no improvement with decreasing antiepileptic, would need to consider SNF, as patient does not appear to have appropriate support at discharge.  If this is the plan, would need to convert to HD- husband agreeable. 2.  Impaired mobility -DVT/anticoagulation:  Mechanical: Continue Sequential compression devices, below knee Bilateral lower extremities             -antiplatelet therapy: N/A due to bleed.  3. Chronic migraines/ Pain Management: Continue Fioricet 3-4 times a day on average. Added prn Fioricet q8H  Controlled with meds on 5/1  Monitor with increased exertion 4. Mood: LCSW to follow for evaluation and support.  -antipsychotic agents: N/A 5. Neuropsych: This patient is not fully capable of making decisions on her own behalf. 6. Skin/Wound Care: Monitor wound for healing.             -Family to provide meals from home as able.  -bilateral PRAFOs ordered but patient refuses to wear 7. Fluids/Electrolytes/Nutrition: Continue PD--does not need fluid restriction.              --has had chronic issues with variable intake as well as N/V likely due to gastroparesis.  8. Chronic Hypotension: Continue TEDs when out of bed             Vitals:   08/30/20 1919 08/31/20 0556  BP: 122/70 136/74   Pulse: 91 77  Resp: 16 16  Temp: 98.5 F (36.9 C) 99 F (37.2 C)  SpO2: 99% 98%   Controlled on 5/1, improved 9.  ESRD on PD: Nephrology to assist/manage PD.  10.  Recurrent Seizures: Has been seizure free, continue Brivaracetam and Lacosamide bid.              --Lacosamide was decreased on 03/31 to avoid sedative SE, decreased again on 4/28- appreciate neurology recommendations..  No breakthrough seizures since admission- 4/29 11. CAD/NSTEMI s/p PCI: Treated medically.   Off DAPT due to bleed   Continue Lipitor.   Metoprolol low-dose resumed  Heart rate controlled on 4/29, continue to monitor.  12. Acute on chronic anemia:   Hemoglobin 10.4 on 4/27  Continue to monitor 13. H/o Depression: Continue Celexa 14. Sick Euthyroid: Will  need follow up as illness resolves.              --TSH- 5.80/Free T4-1.2 (03/27) 15. Gastroparesis w/chronic N/V/anorexia: Has been ongoing per records review.              --question failure of PD (husband reports no N/V with brief episode of HD in the past)  Continue Reglan 5 3 times daily  Hesitant to continue increase medications due to #18  Controlled for the most part 16.  Hyponatremia             --chronic hyponatremia with Na-130-133 range  Sodium 127 on 4/23, 126 on 4/30, continue management as per nephro, Na tabs have been d/ced             --Continue Sodium bicarb to manage metabolic acidosis. 17.  Severe hypoalbuminemia  Supplement d/ced given nausea and vomiting- recommended nuts as source of protein. Her daughter has brought in.  18.  Prolonged QTC  ECG reviewed, improved since last visit  Compazine and dose/frequency decreased 19.  Sleep disturbance- "never a good sleeper" , on PD a night and gets disturbed by hospital sounds, cont trazodone and melatonin    Reasonably controlled 20.  Hypokalemia  Potassium 3.1 on 4/27  Scheduled supplement ordered.   LOS: 27 days A FACE TO FACE EVALUATION WAS PERFORMED  Amy [name] P  Amy [name] 08/31/2020, 12:10 PM

## 2020-09-01 DIAGNOSIS — G479 Sleep disorder, unspecified: Secondary | ICD-10-CM | POA: Diagnosis not present

## 2020-09-01 DIAGNOSIS — D649 Anemia, unspecified: Secondary | ICD-10-CM | POA: Diagnosis not present

## 2020-09-01 DIAGNOSIS — S065X9A Traumatic subdural hemorrhage with loss of consciousness of unspecified duration, initial encounter: Secondary | ICD-10-CM | POA: Diagnosis not present

## 2020-09-01 DIAGNOSIS — G8918 Other acute postprocedural pain: Secondary | ICD-10-CM | POA: Diagnosis not present

## 2020-09-01 LAB — BASIC METABOLIC PANEL
Anion gap: 11 (ref 5–15)
BUN: 18 mg/dL (ref 8–23)
CO2: 24 mmol/L (ref 22–32)
Calcium: 8.2 mg/dL — ABNORMAL LOW (ref 8.9–10.3)
Chloride: 91 mmol/L — ABNORMAL LOW (ref 98–111)
Creatinine, Ser: 3.12 mg/dL — ABNORMAL HIGH (ref 0.44–1.00)
GFR, Estimated: 15 mL/min — ABNORMAL LOW (ref >60)
Glucose, Bld: 84 mg/dL (ref 70–99)
Potassium: 3.4 mmol/L — ABNORMAL LOW (ref 3.5–5.1)
Sodium: 126 mmol/L — ABNORMAL LOW (ref 135–145)

## 2020-09-01 LAB — CBC
HCT: 33.8 % — ABNORMAL LOW (ref 36.0–46.0)
Hemoglobin: 11.3 g/dL — ABNORMAL LOW (ref 12.0–15.0)
MCH: 32.9 pg (ref 26.0–34.0)
MCHC: 33.4 g/dL (ref 30.0–36.0)
MCV: 98.5 fL (ref 80.0–100.0)
Platelets: 242 10*3/uL (ref 150–400)
RBC: 3.43 MIL/uL — ABNORMAL LOW (ref 3.87–5.11)
RDW: 18.3 % — ABNORMAL HIGH (ref 11.5–15.5)
WBC: 9.4 10*3/uL (ref 4.0–10.5)
nRBC: 0 % (ref 0.0–0.2)

## 2020-09-01 MED ORDER — HEPARIN SODIUM (PORCINE) 5000 UNIT/ML IJ SOLN
5000.0000 [IU] | Freq: Three times a day (TID) | INTRAMUSCULAR | Status: DC
  Administered 2020-09-03 – 2020-09-15 (×35): 5000 [IU] via SUBCUTANEOUS
  Filled 2020-09-01 (×36): qty 1

## 2020-09-01 MED ORDER — BUTALBITAL-APAP-CAFFEINE 50-325-40 MG PO TABS
1.0000 | ORAL_TABLET | Freq: Three times a day (TID) | ORAL | Status: DC | PRN
  Administered 2020-09-01 – 2020-09-15 (×18): 1 via ORAL
  Filled 2020-09-01 (×19): qty 1

## 2020-09-01 MED ORDER — VANCOMYCIN HCL 1000 MG/200ML IV SOLN
1000.0000 mg | INTRAVENOUS | Status: AC
  Filled 2020-09-01: qty 200

## 2020-09-01 NOTE — Progress Notes (Signed)
Physical Therapy Session Note  Patient Details  Name: Amy [name] MRN: [id number] Date of Birth: [birth year]  Today's Date: 09/01/2020 PT Individual Time: 1000-1115 PT Individual Time Calculation (min): 75 min   Short Term Goals: Week 4:  PT Short Term Goal 1 (Week 4): Patient will perform at least 50% of transfers with min A. PT Short Term Goal 2 (Week 4): Patient will ambulate 26' with RW and mod A. PT Short Term Goal 3 (Week 4): Patient will propel w/c 100' with S PT Short Term Goal 4 (Week 4): Patient will stand for 1 minute for functional task with min A.  Skilled Therapeutic Interventions/Progress Updates:   Patient in supine with daughter present.  Applied ACE wraps and pants in supine with max to total A.  Patient supine to sit with min A and mod cues increased time.  Patient pivot to w/c with mod A and mod to max step by step cues.  Patient in w/c assisted to ortho gym.  Performed car transfer stand step with RW to simulated SUV height, needing step stool after sitting to scoot back and max A overall.  Patient reports spouse has a sedan so likely can use that easier. Patient assisted in w/c to therapy gym.  Performed sit to stand to RW mod A and ambulated x 10' with RW and mod A.  Patient seated with S still listing to R.  Discussed progress and options for d/c with Jarrett Soho, pt's daughter.  Patient participant as well and relates she doesn't mind to stay longer knowing she needs a lot of help.  Educated will depend on MD and Education officer, museum.  Patient sit to stand to RW and step up to 4" step x 2 with L first with cues and mod to max A.  Patient reported incontinent of bowel.  Assisted to room in w/c.  Performed stand step to [facility] with mod A with RW.  Max A to doff brief & pants.  Patient sit to stand to RW mod A and total A for hygiene and donning new brief and pants.  Patient transfer to EOB with RW and mod to max A for lowering help to bed.  Performed stand step to recliner with mod A with RW.   Left seated in recliner all needs in reach and belt alarm active.   Therapy Documentation Precautions:  Precautions Precautions: Fall Precaution Comments: seizure precautions Restrictions Weight Bearing Restrictions: No Other Position/Activity Restrictions: head incision  Pain: Pain Assessment Pain Scale: 0-10 Pain Score: 0-No pain   Therapy/Group: Individual Therapy  Reginia Naas  Magda Kiel, PT 09/01/2020, 10:58 AM

## 2020-09-01 NOTE — Progress Notes (Signed)
Patient ID: Amy [name], female   DOB: 02-20-1951, 70 y.o.   MRN: [id number] Spoke with [facility] who has received the information and is discussing with nursing if can accept pt. Aware pt will need HD 3x week and reports her husband will have to make arrangements for her to get back and forth whichever the facility she will get a chair. She will know more tomorrow. Will call tomorrow.

## 2020-09-01 NOTE — Progress Notes (Signed)
Occupational Therapy Session Note  Patient Details  Name: Amy [name] MRN: [id number] Date of Birth: 1951/05/02  Today's Date: 09/01/2020 OT Individual Time: 1405-1500 OT Individual Time Calculation (min): 55 min    Short Term Goals: Week 4:  OT Short Term Goal 1 (Week 4): Patient will complete bed mobility with min A OT Short Term Goal 2 (Week 4): patient will complete toileting with mod A OT Short Term Goal 3 (Week 4): patient will complete sit to stand and SPT with RW consistent min/mod A  Skilled Therapeutic Interventions/Progress Updates:    Patient in bed, alert and denies pain, she states that she has already done all adl tasks today.  Supine to sitting edge of bed with min A.  Unsupported sitting with CS for trunk and shoulder mobility activities with focus on posture and pelvic mobility.  Sit to stand at edge of bed x 3 with min/mod A to stand and CGA to maintain balance - able to tolerate standing activity for approx 2 minutes each attempt.  Sit to supine with min A.  Completed rolling, trunk and conditioning activities at bed level with CS and cues.  She remained in bed at close of session, bed alarm set and call bell in hand.    Therapy Documentation Precautions:  Precautions Precautions: Fall Precaution Comments: seizure precautions Restrictions Weight Bearing Restrictions: No Other Position/Activity Restrictions: head incision  Therapy/Group: Individual Therapy  Carlos Levering 09/01/2020, 7:38 AM

## 2020-09-01 NOTE — Progress Notes (Signed)
Speech Language Pathology Daily Session Note  Patient Details  Name: Amy [name] MRN: [id number] Date of Birth: Nov 28, 1950  Today's Date: 09/01/2020 SLP Individual Time: 1115-1200 SLP Individual Time Calculation (min): 45 min  Short Term Goals: Week 4: SLP Short Term Goal 1 (Week 4): STG=LTG due to short ELOS (5/3)  Skilled Therapeutic Interventions: Skilled SLP intervention focused on cognition. Immediate recall l memory task with long sentences containing 3-4 details completed with mod A for use of strategy to repeat information to herself. Education completed with patient on receiving important information written on paper for her to reference later due to deficits in memory. Pt completed mildly complex 4x4 mini sudoku with moderate A for 1/4 puzzles and cues faded to supervision A to increase awareness of duplicate numbers in rows/columns once familiar with task. Consulted with CSW following session and pt is not dc'ing home tomorrow and will possible be going to snf. Cont with therapy per plan of care.      Pain Pain Assessment Pain Scale: Faces Pain Score: 6  Faces Pain Scale: No hurt Pain Type: Chronic pain Pain Location: Head Pain Orientation: Anterior Pain Descriptors / Indicators: Headache Pain Onset: On-going Pain Intervention(s): Medication (See eMAR)  Therapy/Group: Individual Therapy  Darrol Poke Undrea Archbold 09/01/2020, 11:53 AM

## 2020-09-01 NOTE — Progress Notes (Signed)
Patient ID: Amy [name], female   DOB: May 17, 1950, 70 y.o.   MRN: [id number] Spoke with Mike-husband who would like worker to send information to [place] since he knows someone on the board there. Will get FL2 and send to facility. Pt will still need to be switched to HD versus PD since no SNF will take PD. Husband has spoken with MD this am and wants her to stay here as long as possible, but will need to work on a plan for discharge, which was suppose to be this Wed.

## 2020-09-01 NOTE — Progress Notes (Signed)
[place] PHYSICAL MEDICINE & REHABILITATION PROGRESS NOTE  Subjective/Complaints: Patient seen sitting up in bed this AM.  Daughter and Husband at bedside.  She states she slept fairly overnight.  She has questions regarding discharge and wants to go home.  Family has questions regarding medications and length of stay.  Encouraged family discussion with patient regarding discharge disposition.  Family states patient was in agreement yesterday, but appears to be reluctant today.  She is seen by nephrology yesterday, notes reviewed-Daily potassium supplementation initiated.  ROS: Denies CP, SOB, nausea, vomiting, diarrhea  Objective: Vital Signs: Blood pressure (!) 147/79, pulse 79, temperature 98.8 F (37.1 C), temperature source Oral, resp. rate 18, height 5\' 4"  (1.626 m), weight 45.9 kg, SpO2 100 %. No results found. Recent Labs    08/30/20 0538 09/01/20 0548  WBC 10.3 9.4  HGB 10.8* 11.3*  HCT 32.4* 33.8*  PLT 230 242   Recent Labs    08/30/20 0538 09/01/20 0548  NA 126* 126*  K 3.3* 3.4*  CL 91* 91*  CO2 24 24  GLUCOSE 88 84  BUN 18 18  CREATININE 2.90* 3.12*  CALCIUM 8.3* 8.2*    Intake/Output Summary (Last 24 hours) at 09/01/2020 1247 Last data filed at 09/01/2020 0500 Gross per 24 hour  Intake 360 ml  Output 100 ml  Net 260 ml        Physical Exam: BP (!) 147/79 (BP Location: Right Arm)   Pulse 79   Temp 98.8 F (37.1 C) (Oral)   Resp 18   Ht 5\' 4"  (1.626 m)   Wt 45.9 kg   SpO2 100%   BMI 17.37 kg/m  Constitutional: No distress . Vital signs reviewed. HENT: Normocephalic.  Atraumatic. Eyes: EOMI. No discharge. Cardiovascular: No JVD.  RRR. Respiratory: Normal effort.  No stridor.  Bilateral clear to auscultation. GI: Non-distended.  BS +.  + PD site. Skin: Warm and dry.  Intact. Psych: Normal mood.  Normal behavior. Musc: No edema in extremities.  No tenderness in extremities. Neuro: Alert Motor: LUE: 4/5 proximal distal, stable RUE: 4/5  proximal distal LLE: Hip flexion 4/5, knee extension 4/5, ankle dorsiflexion 4-/5 RLE: Hip flexion 4/5, knee extension 4/5, ankle dorsiflexion 3-3+/5  Assessment/Plan: 1. Functional deficits which require 3+ hours per day of interdisciplinary therapy in a comprehensive inpatient rehab setting.  Physiatrist is providing close team supervision and 24 hour management of active medical problems listed below.  Physiatrist and rehab team continue to assess barriers to discharge/monitor patient progress toward functional and medical goals   Care Tool:  Bathing    Body parts bathed by patient: Right arm,Left arm,Chest,Abdomen,Front perineal area,Face,Right upper leg,Left upper leg,Buttocks,Right lower leg,Left lower leg   Body parts bathed by helper: Right lower leg,Left lower leg,Buttocks     Bathing assist Assist Level: Moderate Assistance - Patient 50 - 74%     Upper Body Dressing/Undressing Upper body dressing   What is the patient wearing?: Pull over shirt    Upper body assist Assist Level: Minimal Assistance - Patient > 75%    Lower Body Dressing/Undressing Lower body dressing      What is the patient wearing?: Pants     Lower body assist Assist for lower body dressing: Moderate Assistance - Patient 50 - 74%     Toileting Toileting    Toileting assist Assist for toileting: 2 Helpers     Transfers Chair/bed transfer  Transfers assist     Chair/bed transfer assist level: Moderate Assistance - Patient  50 - 74% Chair/bed transfer assistive device: Walker,Armrests   Locomotion Ambulation   Ambulation assist   Ambulation activity did not occur: Safety/medical concerns  Assist level: 2 helpers Assistive device: Walker-rolling Max distance: 8'   Walk 10 feet activity   Assist  Walk 10 feet activity did not occur: Safety/medical concerns  Assist level: 2 helpers Assistive device: Walker-rolling   Walk 50 feet activity   Assist Walk 50 feet with 2  turns activity did not occur: Safety/medical concerns         Walk 150 feet activity   Assist Walk 150 feet activity did not occur: Safety/medical concerns         Walk 10 feet on uneven surface  activity   Assist Walk 10 feet on uneven surfaces activity did not occur: Safety/medical concerns         Wheelchair     Assist Will patient use wheelchair at discharge?: Yes Type of Wheelchair: Manual    Wheelchair assist level: Supervision/Verbal cueing Max wheelchair distance: 40'    Wheelchair 50 feet with 2 turns activity    Assist    Wheelchair 50 feet with 2 turns activity did not occur: Safety/medical concerns   Assist Level: Supervision/Verbal cueing   Wheelchair 150 feet activity     Assist  Wheelchair 150 feet activity did not occur: Safety/medical concerns        Medical Problem List and Plan: 1.  Right hemiparesis and aphasia secondary to large left SDH s/p craniotomy  Continue CIR 2.  Impaired mobility -DVT/anticoagulation:  Mechanical: Continue Sequential compression devices, below knee Bilateral lower extremities             -antiplatelet therapy: N/A due to bleed.  3. Chronic migraines/ Pain Management: Continue Fioricet 3-4 times a day on average. Added prn Fioricet q8H  Controlled with meds on 5/2  Monitor with increased exertion 4. Mood: LCSW to follow for evaluation and support.  -antipsychotic agents: N/A 5. Neuropsych: This patient is not fully capable of making decisions on her own behalf. 6. Skin/Wound Care: Monitor wound for healing.             -Family to provide meals from home as able.  -bilateral PRAFOs ordered but patient refuses to wear 7. Fluids/Electrolytes/Nutrition: Continue PD--does not need fluid restriction.              --has had chronic issues with variable intake as well as N/V likely due to gastroparesis.  8. Chronic Hypotension: Continue TEDs when out of bed             Vitals:   08/31/20 2001  09/01/20 0503  BP: 129/73 (!) 147/79  Pulse: 79 79  Resp: 17 18  Temp: 98.4 F (36.9 C) 98.8 F (37.1 C)  SpO2: 100% 100%   Controlled on 5/2 9.  ESRD on PD: Nephrology to assist/manage PD, plan to transition to HD.  10.  Recurrent Seizures: Has been seizure free, continue Brivaracetam and Lacosamide bid.              --Lacosamide was decreased on 03/31 to avoid sedative SE, decreased again on 4/28- appreciate neurology recommendations..  No breakthrough seizures since admission- 5/2 11. CAD/NSTEMI s/p PCI: Treated medically.   Off DAPT due to bleed   Continue Lipitor.   Metoprolol low-dose resumed  Heart rate controlled on 5/2, continue to monitor.  12. Acute on chronic anemia:   Hemoglobin 11.3 on 5/2  Continue to monitor 13. H/o Depression:  Continue Celexa 14. Sick Euthyroid: Will need follow up as illness resolves.              --TSH- 5.80/Free T4-1.2 (03/27) 15. Gastroparesis w/chronic N/V/anorexia: Has been ongoing per records review.              --question failure of PD (husband reports no N/V with brief episode of HD in the past)  Continue Reglan 5 3 times daily  Hesitant to continue increase medications due to #18  Controlled overall 16.  Hyponatremia             --chronic hyponatremia with Na-130-133 range  Recs per nephro             --Continue Sodium bicarb to manage metabolic acidosis. 17.  Severe hypoalbuminemia  Supplement d/ced given nausea and vomiting- recommended nuts as source of protein. Her daughter has brought in.  18.  Prolonged QTC  ECG reviewed, improved since last visit  Compazine and dose/frequency decreased 19.  Sleep disturbance- "never a good sleeper" , on PD a night and gets disturbed by hospital sounds, cont trazodone and melatonin    Reasonably controlled 20.  Hypokalemia  Potassium 3.4 on 5/2  Scheduled supplement ordered.   LOS: 28 days A FACE TO FACE EVALUATION WAS PERFORMED  Evolet Salminen Lorie Phenix 09/01/2020, 12:47 PM

## 2020-09-01 NOTE — Progress Notes (Addendum)
[facility] Progress Note   Subjective:    Family at bedside. Feels well. Has no complaints.  No issues with PD overnight   Objective Vitals:   08/31/20 1937 08/31/20 2001 09/01/20 0500 09/01/20 0503  BP: 129/73 129/73  (!) 147/79  Pulse: 82 79  79  Resp: 18 17  18   Temp: 98.1 F (36.7 C) 98.4 F (36.9 C)  98.8 F (37.1 C)  TempSrc: Oral Oral  Oral  SpO2: 100% 100%  100%  Weight:  44 kg 45.9 kg   Height:       Physical Exam  General:Alert, thin female in NAD Heart:RRR, no MRG Lungs:nonlabored breathing, lungs CTA anteriorly Abdomen:Bowel sounds normoactive, soft NTND no ascites, PD catheter present Extremities:No LE edema  Dialysis Access:PD catheter exit site clean dry   Additional Objective Labs: Basic Metabolic Panel: Recent Labs  Lab 08/27/20 0514 08/29/20 0534 08/30/20 0538 09/01/20 0548  NA 126* 127* 126* 126*  K 3.1* 3.5 3.3* 3.4*  CL 93* 92* 91* 91*  CO2 25 22 24 24   GLUCOSE 98 86 88 84  BUN 19 17 18 18   CREATININE 2.93* 2.80* 2.90* 3.12*  CALCIUM 8.1* 8.3* 8.3* 8.2*  PHOS 4.1 4.5 4.5  --    Liver Function Tests: Recent Labs  Lab 08/27/20 0514 08/29/20 0534 08/30/20 0538  ALBUMIN <1.0* 1.2* 1.2*   CBC: Recent Labs  Lab 08/27/20 0514 08/30/20 0538 09/01/20 0548  WBC 6.5 10.3 9.4  HGB 10.4* 10.8* 11.3*  HCT 31.1* 32.4* 33.8*  MCV 98.1 96.7 98.5  PLT 243 230 242   Blood Culture    Component Value Date/Time   SDES FLUID 08/28/2020 1007   SPECREQUEST PERITONEAL DIALYSIS 08/28/2020 1007   CULT  08/28/2020 1007    NO GROWTH 3 DAYS Performed at [facility], [place] [address], [place], [place] [address]    REPTSTATUS 08/31/2020 FINAL 08/28/2020 1007   Medications: . dialysis solution 1.5% low-MG/low-CA     . aspirin EC  81 mg Oral Daily  . atorvastatin  40 mg Oral Daily  . brivaracetam  75 mg Oral BID  . butalbital-acetaminophen-caffeine  1 tablet Oral TID with meals  . citalopram  10 mg Oral Daily   . darbepoetin (ARANESP) injection - NON-DIALYSIS  60 mcg Subcutaneous Q Tue-1800  . docusate sodium  100 mg Oral BID  . gentamicin cream  1 application Topical Daily  . heparin  5,000 Units Subcutaneous Q12H  . kidney failure book   Does not apply Once  . lacosamide  50 mg Oral BID  . melatonin  5 mg Oral QHS  . metoCLOPramide  5 mg Oral TID AC  . metoprolol tartrate  6.25 mg Oral BID  . multivitamin  1 tablet Oral QHS  . pantoprazole  40 mg Oral Daily  . potassium chloride  20 mEq Oral Daily  . triamcinolone cream   Topical TID    Dialysis Orders: [place] [facility] followed by Dr. Candiss Norse ([place]). 4 exchanges overnight, 2000 cc and 1.5 hrs each.  Assessment/Plan:  1.ES[place], [place], f/b Dr Candiss Norse of [place].Cont PD nightly4x 2000cc. Bloody effluent seen on takedown 4/15 and again 4/28 and 4/29. Not receiving heparin with PD. PD gram stain, cultures and cell count not consistent with infection.  Trend H/H.  Consider CT a/p if gross blood noted to Hb trending down.  As of 09/01/20 planning to transition to HD d/t need for SNF placement. Will not be able to continue  PD at SNF. Renal navigator aware.  Will consult IR for tunneled catheter placement.  2.Hypokalemia: chronic, repleting PRN. K+ 3.4, potassium chloride 77mEq daily started 5/1.  3.Vol overload-resolved/HTN-on admitsig LE dependent edema and UE edemaon admission.Volume statusresolvedsince admissionwith PD ,euvolemic on exam.Continues to have good UF with all 1.5% exchanges, will continue.On low-dose p.o. Lopressor 6.25 mg twice daily 4.Fall w/ subdural hematoma- sp SDH evacuation at [facility] on3/24/22 5.Anemia of ESRDHgb 11.3, on Aranesp 60 MCG q. Tuesday 6.MBD-phosphorus controlled without binder; corrected calcium mid 10s, no vitamin D 7.Nausea/ vomiting-intermittent,etiology unclear, uremiaunlikely.PD cell count in the past and culture unrevealing 8. Malnutrition/  debility-verylow albumin. It has been d/w husband on several occassions in the past about a possible trial of HD due to low albumin in a PD patient. He had stated in the past they are not interested and feels she would not tolerate HD.RD on board 9.Multivessel CAD- with reduced EF 25-30% by recent cath in Feb 2022. Per TCTSwasnotCABG candidate. UnderwentPCI to LAD. At [facility] repeat echo per family showed improved EF 55%. 10. Chronichyponatremia-stable in high 120s, appears euvolemic. D/CdNaCl tabs for now and sodium has remained stable, follow labs.   11. Metabolic acidosis-bicarbhas been ok off supplement. 12.H/oSeizures-per primary team - vimpat was reduced 4/29 for side effects.  1 Debility: 2/2 Multiple med issues, most recently SDH recovery and NSTEMI w/ CAD and low EF. Readmitted to CIR on 08/04/20.Now planning discharge to SNF/transition to HD.   Lynnda Child PA-C [facility] 09/01/2020,9:03 AM

## 2020-09-01 NOTE — Consult Note (Signed)
Chief Complaint: Patient was seen in consultation today for tunneled dialysis catheter placement at the request of Dr Burgess Estelle   Supervising Physician: Ruthann Cancer  Patient Status: [facility] - In-pt  History of Present Illness: Amy [name] is a 70 y.o. female   Hx: HTN; ESRD; Hypokalemia; CAD; SDH evac at [facility] 07/24/20  ESRD On PD in [facility]-- bloody effluent 4/15; 4/28 and 4/29 No hep with PD Al labs neg for infection  Note per Nephrology today:  As of 09/01/20 planning to transition to HD d/t need for SNF placement. Will not be able to continue PD at SNF. Renal navigator aware.  Will consult IR for tunneled catheter placement.   Scheduled for tunneled dialysis catheter placement in IR 5/3  Past Medical History:  Diagnosis Date  . Anemia    a. 08/2016 Guaiac + stool. EGD w/ gastritis.  . Anxiety   . Closed fracture of shaft of left ulna 11/27/2018  . Colon polyp    a. 08/2016 Colonoscopy.  . ESRD on peritoneal dialysis ([place])   . Gastritis    a. 08/2016 EGD: Gastritis-->PPI.  [name] GERD (gastroesophageal reflux disease)   . History of chicken pox   . History of measles as a child   . Hypertension   . Hyponatremia    a. 08/2016 in setting of HCTZ Rx.  . Mesenteric lymphadenopathy 04/08/2017  . Multiple thyroid nodules     Past Surgical History:  Procedure Laterality Date  . ABDOMINAL HYSTERECTOMY  2003   due to heavy periods and clotting during menses  . BREAST LUMPECTOMY Left 1990  . CHOLECYSTECTOMY  2010  . COLONOSCOPY WITH PROPOFOL N/A 09/07/2016   Procedure: COLONOSCOPY WITH PROPOFOL;  Surgeon: Lucilla Lame, MD;  Location: [facility] ENDOSCOPY;  Service: Endoscopy;  Laterality: N/A;  . CORONARY STENT INTERVENTION N/A 06/27/2020   Procedure: CORONARY STENT INTERVENTION;  Surgeon: Sherren Mocha, MD;  Location: [place] CV LAB;  Service: Cardiovascular;  Laterality: N/A;  . DIALYSIS/PERMA CATHETER INSERTION N/A 11/16/2018   Procedure: DIALYSIS/PERMA CATHETER INSERTION;   Surgeon: Algernon Huxley, MD;  Location: [place] [facility] LAB;  Service: Cardiovascular;  Laterality: N/A;  . DIALYSIS/PERMA CATHETER REMOVAL N/A 08/16/2019   Procedure: DIALYSIS/PERMA CATHETER REMOVAL;  Surgeon: Algernon Huxley, MD;  Location: [place] CV LAB;  Service: Cardiovascular;  Laterality: N/A;  . DILATION AND CURETTAGE OF UTERUS  1985  . ESOPHAGOGASTRODUODENOSCOPY (EGD) WITH PROPOFOL N/A 09/07/2016   Procedure: ESOPHAGOGASTRODUODENOSCOPY (EGD) WITH PROPOFOL;  Surgeon: Lucilla Lame, MD;  Location: [facility] ENDOSCOPY;  Service: Endoscopy;  Laterality: N/A;  . GALLBLADDER SURGERY  2012  . INTRAMEDULLARY (IM) NAIL INTERTROCHANTERIC Right 04/23/2020   Procedure: INTRAMEDULLARY (IM) NAIL INTERTROCHANTRIC;  Surgeon: Thornton Park, MD;  Location: [facility] ORS;  Service: Orthopedics;  Laterality: Right;  . INTRAVASCULAR IMAGING/OCT N/A 06/27/2020   Procedure: INTRAVASCULAR IMAGING/OCT;  Surgeon: Sherren Mocha, MD;  Location: [place] [facility];  Service: Cardiovascular;  Laterality: N/A;  . KNEE SURGERY     Dr. Tamala Julian  . LEFT HEART CATH AND CORONARY ANGIOGRAPHY N/A 06/25/2020   Procedure: LEFT HEART CATH AND CORONARY ANGIOGRAPHY;  Surgeon: Troy Sine, MD;  Location: [place] [facility] LAB;  Service: Cardiovascular;  Laterality: N/A;  . TONSILLECTOMY    . TONSILLECTOMY AND ADENOIDECTOMY  1970  . UPPER GI ENDOSCOPY  03/02/2006   H. Pylori negative; Dr. Allen Norris    Allergies: Cephalosporins, Clarithromycin, Ensure [nutritional supplements], Gabapentin, Lunesta  [eszopiclone], Nepro [compleat], Penicillin v, and Sulfa antibiotics  Medications: Prior  to Admission medications   Medication Sig Start Date End Date Taking? Authorizing Provider  aspirin 81 MG chewable tablet Chew 1 tablet (81 mg total) by mouth daily. 06/30/20   Geradine Girt, DO  atorvastatin (LIPITOR) 80 MG tablet TAKE 1 TABLET (80 MG TOTAL) BY MOUTH DAILY AT 6 PM. 06/30/20 06/30/21  Geradine Girt, DO  butalbital-acetaminophen-caffeine  (FIORICET) 50-325-40 MG tablet TAKE 1 TO 2 TABLETS BY MOUTH EVERY 4 HOURS AS NEEDED FOR HEADACHE Patient taking differently: Take 2 tablets by mouth every 6 (six) hours as needed for headache. 05/25/20   [name], MD  calcium acetate (PHOSLO) 667 MG capsule Take 667 mg by mouth 3 (three) times daily. 07/18/20   [provider]  clopidogrel (PLAVIX) 75 MG tablet TAKE 1 TABLET (75 MG TOTAL) BY MOUTH DAILY. 06/30/20 06/30/21  Geradine Girt, DO  esomeprazole (NEXIUM) 20 MG capsule Take 20 mg by mouth daily. 07/18/20   [provider]  gentamicin cream (GARAMYCIN) 0.1 % Apply 1 application topically daily. 07/24/20   Love, Ivan Anchors, PA-C  levETIRAcetam (KEPPRA) 500 MG tablet TAKE 1 TABLET (500 MG TOTAL) BY MOUTH TWO TIMES DAILY. 06/30/20 06/30/21  Eulogio Bear U, DO  melatonin 3 MG TABS tablet Take 1 tablet (3 mg total) by mouth at bedtime. 07/24/20   Love, Ivan Anchors, PA-C  metoCLOPramide (REGLAN) 5 MG tablet Take 1 tablet (5 mg total) by mouth daily at 6 (six) AM. 07/25/20   Love, Ivan Anchors, PA-C  metoprolol succinate (TOPROL-XL) 25 MG 24 hr tablet TAKE 1 TABLET (25 MG TOTAL) BY MOUTH DAILY. 06/30/20 06/30/21  Geradine Girt, DO  pantoprazole (PROTONIX) 40 MG tablet TAKE 1 TABLET (40 MG TOTAL) BY MOUTH DAILY. 06/30/20 06/30/21  Geradine Girt, DO  prochlorperazine (COMPAZINE) 10 MG tablet Take 1 tablet (10 mg total) by mouth every 8 (eight) hours as needed for nausea or vomiting. 07/24/20   Love, Ivan Anchors, PA-C  saccharomyces boulardii (FLORASTOR) 250 MG capsule Take 1 capsule (250 mg total) by mouth 2 (two) times daily. 07/24/20   Love, Ivan Anchors, PA-C  zolpidem (AMBIEN) 10 MG tablet Take 10 mg by mouth at bedtime as needed for sleep. 07/24/20   [provider]     Family History  Problem Relation Age of Onset  . Pancreatic cancer Father   . Congestive Heart Failure Maternal Grandmother   . Cancer Paternal Grandfather   . Heart disease Maternal Uncle     Social History    Socioeconomic History  . Marital status: Married    Spouse name: Not on file  . Number of children: 1  . Years of education: Not on file  . Highest education level: Bachelor's degree (e.g., BA, AB, BS)  Occupational History  . Occupation: Retired  Tobacco Use  . Smoking status: Never Smoker  . Smokeless tobacco: Never Used  Vaping Use  . Vaping Use: Never used  Substance and Sexual Activity  . Alcohol use: Yes    Alcohol/week: 0.0 - 2.0 standard drinks  . Drug use: No  . Sexual activity: Not Currently  Other Topics Concern  . Not on file  Social History Narrative   Lives in [place] with husband.  Does not routinely exercise.   Social Determinants of Health   Financial Resource Strain: Not on file  Food Insecurity: Not on file  Transportation Needs: Not on file  Physical Activity: Not on file  Stress: Not on file  Social Connections: Not on  file   Review of Systems: A 12 point ROS discussed and pertinent positives are indicated in the HPI above.  All other systems are negative.  Review of Systems  Vital Signs: BP (!) 147/79 (BP Location: Right Arm)   Pulse 79   Temp 98.8 F (37.1 C) (Oral)   Resp 18   Ht 5\' 4"  (1.626 m)   Wt 101 lb 3.1 oz (45.9 kg)   SpO2 100%   BMI 17.37 kg/m   Physical Exam Vitals reviewed.  HENT:     Mouth/Throat:     Mouth: Mucous membranes are moist.  Cardiovascular:     Rate and Rhythm: Normal rate and regular rhythm.     Heart sounds: Normal heart sounds.  Pulmonary:     Effort: Pulmonary effort is normal.     Breath sounds: Normal breath sounds.  Abdominal:     Palpations: Abdomen is soft.  Musculoskeletal:        General: Normal range of motion.  Skin:    General: Skin is warm.  Neurological:     Mental Status: She is alert and oriented to person, place, and time.  Psychiatric:        Behavior: Behavior normal.     Imaging: EEG adult  Result Date: 08/27/2020 [name], MD     08/27/2020  6:57 PM Patient  Name: Amy [name] MRN: [id number] Epilepsy Attending: [name] Referring Physician/Provider: Dr Zeb Comfort Date: 08/27/2020 Duration: 23.24 mins Patient history: Level of alertness: Awake AEDs during EEG study: BRV, LCM Technical aspects: This EEG study was done with scalp electrodes positioned according to the 10-20 International system of electrode placement. Electrical activity was acquired at a sampling rate of 500Hz  and reviewed with a high frequency filter of 70Hz  and a low frequency filter of 1Hz . EEG data were recorded continuously and digitally stored. Description: The posterior dominant rhythm consists of 8-9 Hz activity of moderate voltage (25-35 uV) seen predominantly in posterior head regions, symmetric and reactive to eye opening and eye closing. EEG showed intermittent 3 to 6 Hz theta-delta slowing in left temporal region.   Hyperventilation and photic stimulation were not performed.   ABNORMALITY - Intermittent slow, left temporal region IMPRESSION: This study is suggestive of cortical dysfunction arising from left temporal region, nonspecific etiology but likely secondary to underlying SDH. No seizures or epileptiform discharges were seen throughout the recording. Priyanka O Yadav    Labs:  CBC: Recent Labs    08/25/20 0448 08/27/20 0514 08/30/20 0538 09/01/20 0548  WBC 5.7 6.5 10.3 9.4  HGB 9.9* 10.4* 10.8* 11.3*  HCT 30.8* 31.1* 32.4* 33.8*  PLT 265 243 230 242    COAGS: Recent Labs    06/19/20 1613  INR 1.1  APTT 24    BMP: Recent Labs    12/16/19 1137 12/17/19 0444 04/22/20 1644 08/27/20 0514 08/29/20 0534 08/30/20 0538 09/01/20 0548  NA 131* 129*   < > 126* 127* 126* 126*  K 2.3* 3.6   < > 3.1* 3.5 3.3* 3.4*  CL 95* 96*   < > 93* 92* 91* 91*  CO2 21* 18*   < > 25 22 24 24   GLUCOSE 105* 93   < > 98 86 88 84  BUN 43* 50*   < > 19 17 18 18   CALCIUM 7.1* 7.8*   < > 8.1* 8.3* 8.3* 8.2*  CREATININE 6.31* 6.71*   < > 2.93* 2.80* 2.90* 3.12*   GFRNONAA 6* 6*   < >  17* 18* 17* 15*  GFRAA 7* 7*  --   --   --   --   --    < > = values in this interval not displayed.    LIVER FUNCTION TESTS: Recent Labs    06/26/20 0107 06/27/20 0026 06/29/20 0111 07/25/20 0317 08/05/20 0720 08/13/20 0739 08/25/20 1210 08/27/20 0514 08/29/20 0534 08/30/20 0538  BILITOT 0.2* 0.3  --  0.6 0.4  --   --   --   --   --   AST 20 19  --  42* 28  --   --   --   --   --   ALT 18 21  --  25 23  --   --   --   --   --   ALKPHOS 91 77  --  159* 122  --   --   --   --   --   PROT 3.6* 3.6*  --  4.5* 3.7*  --   --   --   --   --   ALBUMIN 1.6* 1.6*   < > 2.0* 1.1*   < > 1.3* <1.0* 1.2* 1.2*   < > = values in this interval not displayed.    TUMOR MARKERS: No results for input(s): AFPTM, CEA, CA199, CHROMGRNA in the last 8760 hours.  Assessment and Plan:  Transition to Hemodialysis-- dcing to SNF For tunneled HD cath in IR 5/3 Risks and benefits discussed with the patient including, but not limited to bleeding, infection, vascular injury, pneumothorax which may require chest tube placement, air embolism or even death  All of the patient's questions were answered, patient is agreeable to proceed. Consent signed and in chart.   Thank you for this interesting consult.  I greatly enjoyed meeting Amy [name] and look forward to participating in their care.  A copy of this report was sent to the requesting provider on this date.  Electronically Signed: Lavonia Drafts, PA-C 09/01/2020, 1:19 PM   I spent a total of 20 Minutes    in face to face in clinical consultation, greater than 50% of which was counseling/coordinating care for tunn HD cath

## 2020-09-01 NOTE — NC FL2 (Signed)
[place] LEVEL OF CARE SCREENING TOOL     IDENTIFICATION  Patient Name: Amy [name] Birthdate: November 06, 1950 Sex: female Admission Date (Current Location): 08/04/2020  [facility] and [place] Number:  Engineering geologist and Address:  [facility], [place] [address] [place], [place] [address]      Provider Number: [id number]  Attending Physician Name and Address:  Jamse Arn, MD  Relative Name and Phone Number:  Mike-Noyola-husband [phone]    Current Level of Care: Other (Comment) (Rehab) Recommended Level of Care: [place] Prior Approval Number:    Date Approved/Denied:   PASRR Number: [id number]  Discharge Plan: SNF    Current Diagnoses: Patient Active Problem List   Diagnosis Date Noted  . Sleep disturbance   . SDH (subdural hematoma) ([place]) 08/07/2020  . Postoperative pain   . Acute on chronic anemia   . Subdural hemorrhage following injury ([place]) 08/04/2020  . Orthostasis   . Gastroparesis   . Chronic combined systolic and diastolic congestive heart failure ([place])   . Prolonged Q-T interval on ECG   . Nausea   . Hypoalbuminemia due to protein-calorie malnutrition ([place])   . Nausea and vomiting   . Chronic combined systolic and diastolic CHF (congestive heart failure) ([place])   . Anemia of chronic disease   . Seizures ([place])   . Chronic pain syndrome   . Debility 07/04/2020  . Ischemic cardiomyopathy   . Protein-calorie malnutrition, severe 06/23/2020  . Non-ST elevation (NSTEMI) myocardial infarction ([place])   . Pressure injury of skin 06/21/2020  . C. difficile diarrhea 06/19/2020  . C. difficile colitis 06/19/2020  . Seizure ([place]) 06/19/2020  . Gastroenteritis due to COVID-19 virus 05/29/2020  . Surgery, elective   . S/P ORIF (open reduction internal fixation) fracture   . Closed right hip fracture, initial encounter ([place]) 04/22/2020  . Hypotension 12/16/2019  . ESRD on dialysis ([place]) 12/16/2019   . Depression with anxiety 12/16/2019  . Hypomagnesemia 12/16/2019  . Atrial flutter ([place])   . Atrial fibrillation with RVR ([place]) 04/03/2019  . Benign essential HTN 04/03/2019  . HLD (hyperlipidemia) 04/03/2019  . End stage renal disease ([place]) 12/13/2018  . Itching 11/08/2018  . Hyperparathyroidism, secondary renal ([place]) 09/13/2017  . Ground glass opacity present on imaging of lung 04/08/2017  . Osteoarthritis of knee 12/29/2016  . Normocytic anemia 10/14/2016  . Intractable episodic headache   . Gastritis   . History of adenomatous polyp of colon 09/07/2016  . Iron deficiency anemia 09/05/2016  . Hyponatremia 09/05/2016  . Multiple thyroid nodules 07/04/2015  . Right thyroid nodule 06/27/2015  . Allergic rhinitis, seasonal 12/03/2014  . Fever blister 12/03/2014  . Herniated lumbar intervertebral disc 12/03/2014  . Hypokalemia 12/03/2014  . Recurrent sinus infections 12/03/2014  . Insomnia 11/05/2014  . Acquired scoliosis 06/26/2014  . Intervertebral disc stenosis of neural canal of lumbar region 06/20/2014  . Lumbar facet arthropathy 05/21/2014  . Degenerative spondylolisthesis 03/12/2014  . OP (osteoporosis) 08/10/2013  . Idiopathic peripheral neuropathy 03/24/2009  . Edema 12/27/2007  . Headache, tension-type 10/25/2006  . Essential hypertension 10/24/2006  . Anxiety 05/03/1998  . Esophageal reflux 05/03/1998    Orientation RESPIRATION BLADDER Height & Weight     Self,Situation,Place,Time  Normal Continent Weight: 101 lb 3.1 oz (45.9 kg) Height:  5\' 4"  (162.6 cm)  BEHAVIORAL SYMPTOMS/MOOD NEUROLOGICAL BOWEL NUTRITION STATUS    Convulsions/Seizures (had one at acute hospital none on rehab) Continent Diet (Regular thin liquids)  AMBULATORY STATUS COMMUNICATION OF NEEDS Skin   Extensive Assist Verbally Normal                       Personal Care Assistance Level of Assistance  Bathing,Dressing Bathing Assistance: Limited assistance   Dressing Assistance:  Limited assistance     Functional Limitations Info             SPECIAL CARE FACTORS FREQUENCY  PT (By licensed PT),OT (By licensed OT),Speech therapy     PT Frequency: 5x week OT Frequency: 5x week     Speech Therapy Frequency: 5x week      Contractures Contractures Info: Not present    Additional Factors Info  Code Status,Allergies Code Status Info: Full Code Allergies Info: cephalosparins, Clarithromycin, Ensure, nepro, Gabapentin, Pencillin, Sulfa Antiobitics           Current Medications (09/01/2020):  This is the current hospital active medication list Current Facility-Administered Medications  Medication Dose Route Frequency Provider Last Rate Last Admin  . acetaminophen (TYLENOL) tablet 325-650 mg  325-650 mg Oral Q4H PRN Bary Leriche, PA-C   650 mg at 08/29/20 1738  . aspirin EC tablet 81 mg  81 mg Oral Daily Bary Leriche, PA-C   81 mg at 09/01/20 7517  . atorvastatin (LIPITOR) tablet 40 mg  40 mg Oral Daily Bary Leriche, PA-C   40 mg at 09/01/20 0017  . bisacodyl (DULCOLAX) suppository 10 mg  10 mg Rectal Daily PRN Love, Pamela S, PA-C      . brivaracetam (BRIVIACT) tablet 75 mg  75 mg Oral BID Love, Pamela S, PA-C   75 mg at 09/01/20 4944  . butalbital-acetaminophen-caffeine (FIORICET) 50-325-40 MG per tablet 1 tablet  1 tablet Oral TID with meals Bary Leriche, PA-C   1 tablet at 09/01/20 0530  . butalbital-acetaminophen-caffeine (FIORICET) 50-325-40 MG per tablet 1 tablet  1 tablet Oral Q8H PRN Ranell Patrick Clide Deutscher, MD   1 tablet at 08/31/20 2043  . camphor-menthol (SARNA) lotion   Topical PRN Bary Leriche, PA-C   Given at 08/13/20 2134  . citalopram (CELEXA) tablet 10 mg  10 mg Oral Daily Bary Leriche, PA-C   10 mg at 09/01/20 9675  . Darbepoetin Alfa (ARANESP) injection 60 mcg  60 mcg Subcutaneous Q Tue-1800 Penninger, [place], [place]   60 mcg at 08/26/20 1739  . dialysis solution 1.5% low-MG/low-CA dianeal solution   Intraperitoneal Q24H Collins, Samantha  G, PA-C      . docusate sodium (COLACE) capsule 100 mg  100 mg Oral BID Reesa Chew S, PA-C   100 mg at 09/01/20 9163  . gentamicin cream (GARAMYCIN) 0.1 % 1 application  1 application Topical Daily Janalee Dane, PA-C   1 application at 84/66/59 2018  . guaiFENesin-dextromethorphan (ROBITUSSIN DM) 100-10 MG/5ML syrup 5-10 mL  5-10 mL Oral Q6H PRN Love, Pamela S, PA-C      . heparin injection 5,000 Units  5,000 Units Subcutaneous Q12H Bary Leriche, PA-C   5,000 Units at 09/01/20 9357  . kidney failure book   Does not apply Once Jamse Arn, MD      . lacosamide (VIMPAT) tablet 50 mg  50 mg Oral BID [name], MD   50 mg at 09/01/20 0177  . LORazepam (ATIVAN) injection 2 mg  2 mg Intravenous PRN [name], MD      . melatonin tablet 5 mg  5 mg Oral QHS Love,  Ivan Anchors, PA-C   5 mg at 08/31/20 2042  . melatonin tablet 5 mg  5 mg Oral QHS PRN Bary Leriche, PA-C   5 mg at 08/27/20 2002  . methocarbamol (ROBAXIN) tablet 500 mg  500 mg Oral Q6H PRN Bary Leriche, PA-C   500 mg at 08/28/20 0401  . metoCLOPramide (REGLAN) tablet 5 mg  5 mg Oral TID AC Jamse Arn, MD   5 mg at 09/01/20 0534  . metoprolol tartrate (LOPRESSOR) 25 mg/10 mL oral suspension 6.25 mg  6.25 mg Oral BID Reesa Chew S, PA-C   6.25 mg at 09/01/20 2446  . multivitamin (RENA-VIT) tablet 1 tablet  1 tablet Oral QHS Penninger, [place], [place]   1 tablet at 08/31/20 2043  . pantoprazole (PROTONIX) EC tablet 40 mg  40 mg Oral Daily Bary Leriche, PA-C   40 mg at 09/01/20 2863  . polyethylene glycol (MIRALAX / GLYCOLAX) packet 17 g  17 g Oral Daily PRN Bary Leriche, PA-C   17 g at 08/25/20 0735  . potassium chloride SA (KLOR-CON) CR tablet 20 mEq  20 mEq Oral Daily Janalee Dane, PA-C   20 mEq at 09/01/20 8177  . prochlorperazine (COMPAZINE) tablet 5 mg  5 mg Oral Q8H PRN Jamse Arn, MD   5 mg at 08/29/20 1165   Or  . prochlorperazine (COMPAZINE) injection 5 mg  5 mg Intramuscular Q8H PRN  Jamse Arn, MD       Or  . prochlorperazine (COMPAZINE) suppository 12.5 mg  12.5 mg Rectal Q8H PRN Jamse Arn, MD      . traZODone (DESYREL) tablet 25-50 mg  25-50 mg Oral QHS PRN Bary Leriche, PA-C   50 mg at 08/27/20 2000  . triamcinolone (KENALOG) 0.1 % cream   Topical TID Bary Leriche, PA-C   Given at 09/01/20 7903     Discharge Medications: Please see discharge summary for a list of discharge medications.  Relevant Imaging Results:  Relevant Lab Results:   Additional Information SSN: [id number] Currently on Perotineal dialysis and would need to switch to Hemo-dialysis to go SNF. Has been COVID vaccinated  Valine Drozdowski, Gardiner Rhyme, LCSW

## 2020-09-02 DIAGNOSIS — S065X9A Traumatic subdural hemorrhage with loss of consciousness of unspecified duration, initial encounter: Secondary | ICD-10-CM | POA: Diagnosis not present

## 2020-09-02 DIAGNOSIS — G479 Sleep disorder, unspecified: Secondary | ICD-10-CM | POA: Diagnosis not present

## 2020-09-02 DIAGNOSIS — G8918 Other acute postprocedural pain: Secondary | ICD-10-CM | POA: Diagnosis not present

## 2020-09-02 DIAGNOSIS — D649 Anemia, unspecified: Secondary | ICD-10-CM | POA: Diagnosis not present

## 2020-09-02 LAB — PROTIME-INR
INR: 1.1 (ref 0.8–1.2)
Prothrombin Time: 13.8 seconds (ref 11.4–15.2)

## 2020-09-02 MED ORDER — LIDOCAINE-PRILOCAINE 2.5-2.5 % EX CREA: 1.0000 "application " | TOPICAL_CREAM | CUTANEOUS | Status: DC | PRN

## 2020-09-02 MED ORDER — HEPARIN SODIUM (PORCINE) 1000 UNIT/ML DIALYSIS: 1000.0000 [IU] | INTRAMUSCULAR | Status: DC | PRN

## 2020-09-02 MED ORDER — SODIUM CHLORIDE 0.9 % IV SOLN: 100.0000 mL | INTRAVENOUS | Status: DC | PRN

## 2020-09-02 MED ORDER — HEPARIN 1000 UNIT/ML FOR PERITONEAL DIALYSIS: 500.0000 [IU] | INTRAMUSCULAR | Status: DC | PRN

## 2020-09-02 MED ORDER — ALTEPLASE 2 MG IJ SOLR: 2.0000 mg | Freq: Once | INTRAMUSCULAR | Status: DC | PRN

## 2020-09-02 MED ORDER — GENTAMICIN SULFATE 0.1 % EX CREA: 1.0000 "application " | TOPICAL_CREAM | Freq: Every day | CUTANEOUS | Status: DC

## 2020-09-02 MED ORDER — LIDOCAINE HCL (PF) 1 % IJ SOLN: 5.0000 mL | INTRAMUSCULAR | Status: DC | PRN

## 2020-09-02 MED ORDER — CHLORHEXIDINE GLUCONATE CLOTH 2 % EX PADS
6.0000 | MEDICATED_PAD | Freq: Once | CUTANEOUS | Status: AC
  Administered 2020-09-02: 6 via TOPICAL

## 2020-09-02 MED ORDER — PENTAFLUOROPROP-TETRAFLUOROETH EX AERO: 1.0000 "application " | INHALATION_SPRAY | CUTANEOUS | Status: DC | PRN

## 2020-09-02 MED ORDER — DARBEPOETIN ALFA 60 MCG/0.3ML IJ SOSY
60.0000 ug | PREFILLED_SYRINGE | INTRAMUSCULAR | Status: DC
Start: 2020-09-09 — End: 2020-09-05

## 2020-09-02 NOTE — Progress Notes (Signed)
Physical Therapy Session Note  Patient Details  Name: Amy [name] MRN: [id number] Date of Birth: December 13, 1950  Today's Date: 09/02/2020 PT Individual Time: 1216-2446 PT Individual Time Calculation (min): 28 min   Today's Date: 09/02/2020 PT Missed Time: 32 Minutes Missed Time Reason: Patient fatigue  Short Term Goals: Week 3:  PT Short Term Goal 1 (Week 3): Patient will perform sit to stand transfers with mod A consistently. PT Short Term Goal 1 - Progress (Week 3): Met PT Short Term Goal 2 (Week 3): Patient will perfom bed/chair transfers with mod A consistently. PT Short Term Goal 2 - Progress (Week 3): Met PT Short Term Goal 3 (Week 3): Patient will ambulate 47' with RW and mod A with w/c follow. PT Short Term Goal 3 - Progress (Week 3): Progressing toward goal PT Short Term Goal 4 (Week 3): standing for at least 1 minute for functional ADL with mod A. PT Short Term Goal 4 - Progress (Week 3): Progressing toward goal Week 4:  PT Short Term Goal 1 (Week 4): Patient will perform at least 50% of transfers with min A. PT Short Term Goal 2 (Week 4): Patient will ambulate 40' with RW and mod A. PT Short Term Goal 3 (Week 4): Patient will propel w/c 100' with S PT Short Term Goal 4 (Week 4): Patient will stand for 1 minute for functional task with min A.  Skilled Therapeutic Interventions/Progress Updates:   Received pt sitting in bed, pt agreeable to therapy, and denied any pain during session but reported having headache and feeling fatigued. BP: 131/60 and HR 78 bpm. RN notified and planning to administer medication as able. Pt requested to remain in room and then further requesting to stay in bed but agreeable to bed level exercises. Pt performed the following exercises sitting upright in bed with supervision and verbal cues for technique: -heel slides 2x12 bilaterally using UEs to assist -hip abduction 2x12 bilaterally -SAQ 2x12 bilaterally  Pt required rest breaks in between exercises  and demonstrating fatigue and requested to end session. Concluded session with pt sitting upright in bed, needs within reach, and bed alarm on. BLE supported on pillow. 32 minutes missed of skilled physical therapy due to fatigue.   Therapy Documentation Precautions:  Precautions Precautions: Fall Precaution Comments: seizure precautions Restrictions Weight Bearing Restrictions: No Other Position/Activity Restrictions: head incision  Therapy/Group: Individual Therapy Alfonse Alpers PT, DPT   09/02/2020, 7:26 AM

## 2020-09-02 NOTE — Progress Notes (Signed)
Physical Therapy Session Note  Patient Details  Name: Amy [name] MRN: [id number] Date of Birth: 01/15/1951  Today's Date: 09/02/2020 PT Individual Time: 1130-1200 PT Individual Time Calculation (min): 30 min   Short Term Goals: Week 4:  PT Short Term Goal 1 (Week 4): Patient will perform at least 50% of transfers with min A. PT Short Term Goal 2 (Week 4): Patient will ambulate 87' with RW and mod A. PT Short Term Goal 3 (Week 4): Patient will propel w/c 100' with S PT Short Term Goal 4 (Week 4): Patient will stand for 1 minute for functional task with min A.  Skilled Therapeutic Interventions/Progress Updates:    Patient in supine wearing gown and reports plans for going down very soon for placement of HD cath.  Performed LE therex in supine as noted below. Patient requesting to toilet and feeling urgent.  Rolled with S using rail and placed bed pan.  Total A for clothing and hygiene.  Patient scooted up in bed with bed in trendelenberg pulling up with headboard and min A.  Patient placed in semi-chair position in bed and left with call bell in reach and bed alarm active.   Therapy Documentation Precautions:  Precautions Precautions: Fall Precaution Comments: seizure precautions Restrictions Weight Bearing Restrictions: No Other Position/Activity Restrictions: head incision Pain: Pain Assessment Pain Score: 0-No pain Exercises: General Exercises - Lower Extremity Short Arc Quad: Strengthening;Both;10 reps;Supine Long Arc Quad:  (5 sec hold) Heel Slides: Both;Strengthening;10 reps;Supine Hip ABduction/ADduction: Strengthening;Both;10 reps;Supine Straight Leg Raises: 10 reps;Strengthening;Supine;Both (AAROM on R) Hip Flexion/Marching: Strengthening;Both;10 reps;Supine (hooklying) Total Joint Exercises Hip ABduction/ADduction: AROM;Both;10 reps;Supine (legs extended) Bridges: Strengthening;Both;10 reps;Supine (5 sec hold) Other Exercises Other Exercises: lateral trunk rotation  x 10    Therapy/Group: Individual Therapy  Reginia Naas  Magda Kiel, PT 09/02/2020, 11:51 AM

## 2020-09-02 NOTE — Progress Notes (Signed)
Speech Language Pathology Weekly Progress and Session Note  Patient Details  Name: Amy [name] MRN: [id number] Date of Birth: 1950-12-17  Beginning of progress report period: August 25, 2020 End of progress report period: Sep 02, 2020  Today's Date: 09/02/2020 SLP Individual Time: 1347-1430 SLP Individual Time Calculation (min): 43 min  Short Term Goals: Week 4: SLP Short Term Goal 1 (Week 4): STG=LTG due to short ELOS (5/3) SLP Short Term Goal 1 - Progress (Week 4): Not met    New Short Term Goals: Week 5: SLP Short Term Goal 1 (Week 5): STG=LTG due to awaiting SNF placement  Weekly Progress Updates: Pt is continuing to work on LTG in mildly complex problem solving, recall and sustained attention, while awaiting SNF placement. Pt demonstrated recall if daily events with use of memory notebook and has participated in mildly complex problem solving tasks with mod-min A verbal cues required, primarily impacted by deficits in memory. Pt would continue to benefit from skilled ST services to focused on maximizing functional independence and reducing burden of care requiring 24 hour supervision and continued ST services.     Intensity: Minumum of 1-2 x/day, 30 to 90 minutes Frequency: 3 to 5 out of 7 days Duration/Length of Stay: still awaiting SNF Treatment/Interventions: Cognitive remediation/compensation;Cueing hierarchy;Functional tasks;Medication managment;Patient/family education;Internal/external aids;Therapeutic Activities;Environmental controls   Daily Session  Skilled Therapeutic Interventions: Skilled ST services focused on cognitive skills. SLP facilitated basic-mildly complex problem solving, recall and sustained attention in familiar card task. Pt had not recall of card task played x2 with SLP. Pt was abel to recall 3 written rules with supervision A verbal cues to use aid, basic problem solving with extra time and mildly complex problem solving skills with mod A verbal cues  impacted by recall within task. Pt was left in room with call bell within reach and bed alarm set. SLP recommends to continue skilled services.      General    Pain Pain Assessment Pain Score: 0-No pain  Therapy/Group: Individual Therapy  Kaylianna Detert  [facility] 09/02/2020, 2:25 PM

## 2020-09-02 NOTE — Progress Notes (Signed)
Occupational Therapy Weekly Progress Note  Patient Details  Name: Amy [name] MRN: [id number] Date of Birth: [birth year]  Beginning of progress report period: August 22, 2020 End of progress report period: Sep 02, 2020     Patient has met 1 of 3 short term goals.  Patient with improved bed mobility and functional transfers this week but continues with inconsistent tolerance and endurance.    Patient continues to demonstrate the following deficits: muscle weakness, decreased cardiorespiratoy endurance and decreased sitting balance, decreased standing balance and decreased postural control and therefore will continue to benefit from skilled OT intervention to enhance overall performance with BADL, iADL and Reduce care partner burden.  Patient progressing toward long term goals..  Continue plan of care.  OT Short Term Goals Week 4:  OT Short Term Goal 1 (Week 4): Patient will complete bed mobility with min A OT Short Term Goal 1 - Progress (Week 4): Met OT Short Term Goal 2 (Week 4): patient will complete toileting with mod A OT Short Term Goal 2 - Progress (Week 4): Progressing toward goal OT Short Term Goal 3 (Week 4): patient will complete sit to stand and SPT with RW consistent min/mod A OT Short Term Goal 3 - Progress (Week 4): Progressing toward goal Week 5:  OT Short Term Goal 1 (Week 5): Patient will complete SPT with consistent min A OT Short Term Goal 2 (Week 5): Patient will complete toileting with mod A OT Short Term Goal 3 (Week 5): patient will complete lower body dressing with min A   Anela Bensman A Armani Gawlik 09/02/2020, 2:34 PM

## 2020-09-02 NOTE — Progress Notes (Signed)
Occupational Therapy Session Note  Patient Details  Name: Amy [name] MRN: [id number] Date of Birth: 09-02-1950  Today's Date: 09/02/2020 OT Individual Time: 1000-1100 OT Individual Time Calculation (min): 60 min    Short Term Goals: Week 4:  OT Short Term Goal 1 (Week 4): Patient will complete bed mobility with min A OT Short Term Goal 2 (Week 4): patient will complete toileting with mod A OT Short Term Goal 3 (Week 4): patient will complete sit to stand and SPT with RW consistent min/mod A  Skilled Therapeutic Interventions/Progress Updates:    Patient in bed, alert, she states that she is going for a procedure and needs encouragement to participate.  Supine to sitting edge of bed with min A.  Unsupported sitting with CS.  Incontinent of bowel.  Sit to stand and SPT to / from bedside commode mod A.  Continent of urine and able to complete bowel movement on commode.  Max A for hygiene and to donn clean brief.  Dependent CM in stance.  Sit to stand x 3 from commode with min/mod A - fatigue noted with last stand and SPT back to bed mod A.  Doffed [place] shirt with min A, donned hospital gown due to upcoming appointment.  Sit to supine with min/mod A.  Completed bed level AROM and conditioning exercises.  Oral care and face care bed level with set up.  Returned to sitting edge of bed with trunk mobility / stretch.  Back to supine at close of session.  Bed alarm set and call bell in hand.    Therapy Documentation Precautions:  Precautions Precautions: Fall Precaution Comments: seizure precautions Restrictions Weight Bearing Restrictions: No Other Position/Activity Restrictions: head incision   Therapy/Group: Individual Therapy  Carlos Levering 09/02/2020, 7:34 AM

## 2020-09-02 NOTE — Progress Notes (Signed)
Patient ID: Amy [name], female   DOB: April 24, 1951, 70 y.o.   MRN: [id number]  Spoke with [facility] admissions who reports they can accept pt but will need to work out the issues with HD prior to her admission. Contacted husband to inform and let know of acceptance to [facility] and the need for him to arrange transport to HD 3x week, since they do not normally take HD patients and will not provide transport to HD appointments. Made aware will need cath for HD and OP-HD place and chair time. Colleen-liaison is working on. Husband wants to speak with renal MD and admissions at [facility]. Working toward early next week for transfer to be able to get all worked out. Continue to work on discharge plans.

## 2020-09-02 NOTE — Progress Notes (Signed)
[place] PHYSICAL MEDICINE & REHABILITATION PROGRESS NOTE  Subjective/Complaints: Patient seen sitting up in bed this AM.  She states she slept well overnight. She is awaiting HD cath placement.   ROS: Denies CP, SOB, nausea, vomiting, diarrhea  Objective: Vital Signs: Blood pressure (!) 141/83, pulse 83, temperature 98.6 F (37 C), temperature source Oral, resp. rate 16, height 5\' 4"  (1.626 m), weight 45.9 kg, SpO2 97 %. No results found. Recent Labs    09/01/20 0548  WBC 9.4  HGB 11.3*  HCT 33.8*  PLT 242   Recent Labs    09/01/20 0548  NA 126*  K 3.4*  CL 91*  CO2 24  GLUCOSE 84  BUN 18  CREATININE 3.12*  CALCIUM 8.2*    Intake/Output Summary (Last 24 hours) at 09/02/2020 0952 Last data filed at 09/01/2020 1955 Gross per 24 hour  Intake 400 ml  Output 100 ml  Net 300 ml        Physical Exam: BP (!) 141/83 (BP Location: Right Arm)   Pulse 83   Temp 98.6 F (37 C) (Oral)   Resp 16   Ht 5\' 4"  (1.626 m)   Wt 45.9 kg   SpO2 97%   BMI 17.37 kg/m   Constitutional: No distress . Vital signs reviewed. HENT: Normocephalic.  Atraumatic. Eyes: EOMI. No discharge. Cardiovascular: No JVD.   Respiratory: Normal effort.  No stridor.   GI: Non-distended.  +PD site.  Skin: Warm and dry.  Intact. Psych: Normal mood.  Normal behavior. Musc: No edema in extremities.  No tenderness in extremities. Neuro: Alert Motor: LUE: 4/5 proximal distal, unchanged RUE: 4/5 proximal distal LLE: Hip flexion 4/5, knee extension 4/5, ankle dorsiflexion 4-/5 RLE: Hip flexion 4/5, knee extension 4/5, ankle dorsiflexion 3-3+/5  Assessment/Plan: 1. Functional deficits which require 3+ hours per day of interdisciplinary therapy in a comprehensive inpatient rehab setting.  Physiatrist is providing close team supervision and 24 hour management of active medical problems listed below.  Physiatrist and rehab team continue to assess barriers to discharge/monitor patient progress toward  functional and medical goals   Care Tool:  Bathing    Body parts bathed by patient: Right arm,Left arm,Chest,Abdomen,Front perineal area,Face,Right upper leg,Left upper leg,Buttocks,Right lower leg,Left lower leg   Body parts bathed by helper: Right lower leg,Left lower leg,Buttocks     Bathing assist Assist Level: Moderate Assistance - Patient 50 - 74%     Upper Body Dressing/Undressing Upper body dressing   What is the patient wearing?: Pull over shirt    Upper body assist Assist Level: Minimal Assistance - Patient > 75%    Lower Body Dressing/Undressing Lower body dressing      What is the patient wearing?: Pants     Lower body assist Assist for lower body dressing: Moderate Assistance - Patient 50 - 74%     Toileting Toileting    Toileting assist Assist for toileting: Maximal Assistance - Patient 25 - 49%     Transfers Chair/bed transfer  Transfers assist     Chair/bed transfer assist level: Moderate Assistance - Patient 50 - 74% Chair/bed transfer assistive device: Walker,Armrests   Locomotion Ambulation   Ambulation assist   Ambulation activity did not occur: Safety/medical concerns  Assist level: Moderate Assistance - Patient 50 - 74% Assistive device: Walker-rolling Max distance: 10   Walk 10 feet activity   Assist  Walk 10 feet activity did not occur: Safety/medical concerns  Assist level: Moderate Assistance - Patient - 50 - 74%  Assistive device: Walker-rolling   Walk 50 feet activity   Assist Walk 50 feet with 2 turns activity did not occur: Safety/medical concerns         Walk 150 feet activity   Assist Walk 150 feet activity did not occur: Safety/medical concerns         Walk 10 feet on uneven surface  activity   Assist Walk 10 feet on uneven surfaces activity did not occur: Safety/medical concerns   Assist level: Moderate Assistance - Patient - 50 - 74% Assistive device: Horticulturist, commercial Will patient use wheelchair at discharge?: Yes Type of Wheelchair: Manual    Wheelchair assist level: Supervision/Verbal cueing Max wheelchair distance: 40'    Wheelchair 50 feet with 2 turns activity    Assist    Wheelchair 50 feet with 2 turns activity did not occur: Safety/medical concerns   Assist Level: Supervision/Verbal cueing   Wheelchair 150 feet activity     Assist  Wheelchair 150 feet activity did not occur: Safety/medical concerns        Medical Problem List and Plan: 1.  Right hemiparesis and aphasia secondary to large left SDH s/p craniotomy  Continue CIR 2.  Impaired mobility -DVT/anticoagulation:  Mechanical: Continue Sequential compression devices, below knee Bilateral lower extremities             -antiplatelet therapy: N/A due to bleed.  3. Chronic migraines/ Pain Management: Continue Fioricet 3-4 times a day on average. Added prn Fioricet q8H  Controlled with meds on 5/3  Monitor with increased exertion 4. Mood: LCSW to follow for evaluation and support.  -antipsychotic agents: N/A 5. Neuropsych: This patient is not fully capable of making decisions on her own behalf. 6. Skin/Wound Care: Monitor wound for healing.             -Family to provide meals from home as able.  -bilateral PRAFOs ordered but patient refuses to wear 7. Fluids/Electrolytes/Nutrition: Continue PD--does not need fluid restriction.              --has had chronic issues with variable intake as well as N/V likely due to gastroparesis.  8. Chronic Hypotension: Continue TEDs when out of bed             Vitals:   09/01/20 1958 09/02/20 0352  BP: 135/81 (!) 141/83  Pulse: 87 83  Resp: 18 16  Temp: 98.1 F (36.7 C) 98.6 F (37 C)  SpO2: 100% 97%   Controlled on 5/3 9.  ESRD on PD:   HD cath placement today 10.  Recurrent Seizures: Has been seizure free, continue Brivaracetam and Lacosamide bid.              --Lacosamide was decreased on  03/31 to avoid sedative SE, decreased again on 4/28- appreciate neurology recommendations..  No breakthrough seizures since admission- 5/3 11. CAD/NSTEMI s/p PCI: Treated medically.   Off DAPT due to bleed   Continue Lipitor.   Metoprolol low-dose resumed  Heart rate controlled on 5/3, continue to monitor.  12. Acute on chronic anemia:   Hemoglobin 11.3 on 5/2  Continue to monitor 13. H/o Depression: Continue Celexa 14. Sick Euthyroid: Will need follow up as illness resolves.              --TSH- 5.80/Free T4-1.2 (03/27) 15. Gastroparesis w/chronic N/V/anorexia: Has been ongoing per records review.              --question failure of  PD (husband reports no N/V with brief episode of HD in the past)  Continue Reglan 5 3 times daily  Hesitant to continue increase medications due to #18  Improved overall 16.  Hyponatremia             --chronic hyponatremia with Na-130-133 range  Recs per nephro             --Continue Sodium bicarb to manage metabolic acidosis. 17.  Severe hypoalbuminemia  Supplement d/ced given nausea and vomiting- recommended nuts as source of protein. Her daughter has brought in.  18.  Prolonged QTC  ECG reviewed, improved since last visit  Compazine and dose/frequency decreased 19.  Sleep disturbance- "never a good sleeper" , on PD a night and gets disturbed by hospital sounds, cont trazodone and melatonin    Reasonably controlled 20.  Hypokalemia  Potassium 3.4 on 5/2  Scheduled supplement ordered.   LOS: 29 days A FACE TO FACE EVALUATION WAS PERFORMED  Jaslin Novitski Lorie Phenix 09/02/2020, 9:52 AM

## 2020-09-02 NOTE — Progress Notes (Signed)
[facility] Progress Note   Subjective:    Seen in room. No complaints this am. Feels well Awaiting placement of dialysis catheter   Objective Vitals:   09/01/20 0904 09/01/20 1331 09/01/20 1958 09/02/20 0352  BP: (!) 146/77 128/77 135/81 (!) 141/83  Pulse: 74 82 87 83  Resp: 16 17 18 16   Temp: 97.8 F (36.6 C) 98.1 F (36.7 C) 98.1 F (36.7 C) 98.6 F (37 C)  TempSrc: Oral   Oral  SpO2: 98% 100% 100% 97%  Weight:      Height:       Physical Exam  General:Alert, thin female in NAD Heart:RRR, no MRG Lungs:nonlabored breathing, lungs CTA anteriorly Abdomen:Bowel sounds normoactive, soft NTND no ascites, PD catheter present Extremities:No LE edema  Dialysis Access:PD catheter exit site clean dry   Additional Objective Labs: Basic Metabolic Panel: Recent Labs  Lab 08/27/20 0514 08/29/20 0534 08/30/20 0538 09/01/20 0548  NA 126* 127* 126* 126*  K 3.1* 3.5 3.3* 3.4*  CL 93* 92* 91* 91*  CO2 25 22 24 24   GLUCOSE 98 86 88 84  BUN 19 17 18 18   CREATININE 2.93* 2.80* 2.90* 3.12*  CALCIUM 8.1* 8.3* 8.3* 8.2*  PHOS 4.1 4.5 4.5  --    Liver Function Tests: Recent Labs  Lab 08/27/20 0514 08/29/20 0534 08/30/20 0538  ALBUMIN <1.0* 1.2* 1.2*   CBC: Recent Labs  Lab 08/27/20 0514 08/30/20 0538 09/01/20 0548  WBC 6.5 10.3 9.4  HGB 10.4* 10.8* 11.3*  HCT 31.1* 32.4* 33.8*  MCV 98.1 96.7 98.5  PLT 243 230 242   Blood Culture    Component Value Date/Time   SDES FLUID 08/28/2020 1007   SPECREQUEST PERITONEAL DIALYSIS 08/28/2020 1007   CULT  08/28/2020 1007    NO GROWTH 3 DAYS Performed at [facility], [place] [address] [place], [place] [address]    REPTSTATUS 08/31/2020 FINAL 08/28/2020 1007   Medications: . [START ON 09/03/2020] sodium chloride    . [START ON 09/03/2020] sodium chloride    . dialysis solution 1.5% low-MG/low-CA    . vancomycin     . aspirin EC  81 mg Oral Daily  . atorvastatin  40 mg Oral Daily  .  brivaracetam  75 mg Oral BID  . Chlorhexidine Gluconate Cloth  6 each Topical Once  . citalopram  10 mg Oral Daily  . [START ON 09/09/2020] darbepoetin (ARANESP) injection - NON-DIALYSIS  60 mcg Subcutaneous Q Tue-1800  . docusate sodium  100 mg Oral BID  . gentamicin cream  1 application Topical Daily  . [START ON 09/03/2020] heparin  5,000 Units Subcutaneous Q8H  . kidney failure book   Does not apply Once  . lacosamide  50 mg Oral BID  . melatonin  5 mg Oral QHS  . metoCLOPramide  5 mg Oral TID AC  . metoprolol tartrate  6.25 mg Oral BID  . multivitamin  1 tablet Oral QHS  . pantoprazole  40 mg Oral Daily  . potassium chloride  20 mEq Oral Daily  . triamcinolone cream   Topical TID    Dialysis Orders: [facility] followed by Dr. Candiss Norse ([place]). 4 exchanges overnight, 2000 cc and 1.5 hrs each.  Assessment/Plan:  1.ESRD-[place]PDin [place], [place], f/b Dr Candiss Norse of [place].Cont PD nightly4x 2000cc. Bloody effluent seen on takedown 4/15 and again 4/28 and 4/29. Not receiving heparin with PD. PD gram stain, cultures and cell count not consistent with infection.  Trend H/H.  Consider CT a/p if gross blood noted to Hb trending down.  As of 09/01/20 planning to transition to HD d/t need for SNF placement. Will not be able to continue PD at SNF. Renal navigator aware.  Will consult IR for tunneled catheter placement.  2.Hypokalemia: chronic, repleting PRN. K+ 3.4, potassium chloride 64mEq daily started 5/1.  3.Vol overload-resolved/HTN-on admitsig LE dependent edema and UE edemaon admission.Volume statusresolvedsince admissionwith PD ,euvolemic on exam.Continues to have good UF with all 1.5% exchanges, will continue.On low-dose p.o. Lopressor 6.25 mg twice daily 4.Fall w/ subdural hematoma- sp SDH evacuation at [facility] on3/24/22 5.Anemia of ESRDHgb 11.3, on Aranesp 60 MCG q. Tuesday 6.MBD-phosphorus controlled without binder; corrected calcium mid 10s, no vitamin  D 7.Nausea/ vomiting-intermittent,etiology unclear, uremiaunlikely.PD cell count in the past and culture unrevealing 8. Malnutrition/ debility-verylow albumin. It has been d/w husband on several occassions in the past about a possible trial of HD due to low albumin in a PD patient. He had stated in the past they are not interested and feels she would not tolerate HD.RD on board 9.Multivessel CAD- with reduced EF 25-30% by recent cath in Feb 2022. Per TCTSwasnotCABG candidate. UnderwentPCI to LAD. At [facility] repeat echo per family showed improved EF 55%. 10. Chronichyponatremia-stable in high 120s, appears euvolemic. D/CdNaCl tabs for now and sodium has remained stable, follow labs.   11. Metabolic acidosis-bicarbhas been ok off supplement. 12.H/oSeizures-per primary team - vimpat was reduced 4/29 for side effects.  28 Debility: 2/2 Multiple med issues, most recently SDH recovery and NSTEMI w/ CAD and low EF. Readmitted to CIR on 08/04/20.Now planning discharge to SNF/transition to HD.   Lynnda Child PA-C [facility] 09/02/2020,10:44 AM

## 2020-09-03 DIAGNOSIS — G479 Sleep disorder, unspecified: Secondary | ICD-10-CM | POA: Diagnosis not present

## 2020-09-03 DIAGNOSIS — S065X9A Traumatic subdural hemorrhage with loss of consciousness of unspecified duration, initial encounter: Secondary | ICD-10-CM | POA: Diagnosis not present

## 2020-09-03 DIAGNOSIS — D649 Anemia, unspecified: Secondary | ICD-10-CM | POA: Diagnosis not present

## 2020-09-03 DIAGNOSIS — G8918 Other acute postprocedural pain: Secondary | ICD-10-CM | POA: Diagnosis not present

## 2020-09-03 LAB — HEPATITIS B CORE ANTIBODY, TOTAL: Hep B Core Total Ab: NONREACTIVE

## 2020-09-03 LAB — HEPATITIS B SURFACE ANTIGEN: Hepatitis B Surface Ag: NONREACTIVE

## 2020-09-03 NOTE — Progress Notes (Signed)
Late entry:  09/02/20 1000am  Attempted to send for pt to come to IR for HD catheter placement.  Per MD, husband would like to speak with him before proceeding. HD catheter on hold. Will follow up on 09/03/20

## 2020-09-03 NOTE — Progress Notes (Addendum)
[facility] Progress Note   Subjective:    Seen in room. No complaints this am. Feels well Disposition pending;   Objective Vitals:   09/02/20 1709 09/02/20 1926 09/03/20 0456 09/03/20 0715  BP: 125/66 135/84 126/72 131/76  Pulse: 76 86 79 78  Resp: 16 16 16    Temp: 98.1 F (36.7 C) 98.5 F (36.9 C) 98.2 F (36.8 C) 97.9 F (36.6 C)  TempSrc: Oral Oral Oral Oral  SpO2: 99% 97% 99% 97%  Weight: 43 kg 43.6 kg  42.1 kg  Height:       Physical Exam  General:Alert, thin female in NAD Heart:RRR, no MRG Lungs:nonlabored breathing, lungs CTA anteriorly Abdomen:Bowel sounds normoactive, soft NTND no ascites, PD catheter present Extremities:No LE edema  Dialysis Access:PD catheter exit site clean dry   Additional Objective Labs: Basic Metabolic Panel: Recent Labs  Lab 08/29/20 0534 08/30/20 0538 09/01/20 0548  NA 127* 126* 126*  K 3.5 3.3* 3.4*  CL 92* 91* 91*  CO2 22 24 24   GLUCOSE 86 88 84  BUN 17 18 18   CREATININE 2.80* 2.90* 3.12*  CALCIUM 8.3* 8.3* 8.2*  PHOS 4.5 4.5  --    Liver Function Tests: Recent Labs  Lab 08/29/20 0534 08/30/20 0538  ALBUMIN 1.2* 1.2*   CBC: Recent Labs  Lab 08/30/20 0538 09/01/20 0548  WBC 10.3 9.4  HGB 10.8* 11.3*  HCT 32.4* 33.8*  MCV 96.7 98.5  PLT 230 242   Blood Culture    Component Value Date/Time   SDES FLUID 08/28/2020 1007   SPECREQUEST PERITONEAL DIALYSIS 08/28/2020 1007   CULT  08/28/2020 1007    NO GROWTH 3 DAYS Performed at [facility], [place] [address], [place], [place] [address]    REPTSTATUS 08/31/2020 FINAL 08/28/2020 1007   Medications: . sodium chloride    . sodium chloride    . dialysis solution 1.5% low-MG/low-CA     . aspirin EC  81 mg Oral Daily  . atorvastatin  40 mg Oral Daily  . brivaracetam  75 mg Oral BID  . citalopram  10 mg Oral Daily  . [START ON 09/09/2020] darbepoetin (ARANESP) injection - NON-DIALYSIS  60 mcg Subcutaneous Q Tue-1800  . docusate  sodium  100 mg Oral BID  . gentamicin cream  1 application Topical Daily  . heparin  5,000 Units Subcutaneous Q8H  . kidney failure book   Does not apply Once  . lacosamide  50 mg Oral BID  . melatonin  5 mg Oral QHS  . metoCLOPramide  5 mg Oral TID AC  . metoprolol tartrate  6.25 mg Oral BID  . multivitamin  1 tablet Oral QHS  . pantoprazole  40 mg Oral Daily  . potassium chloride  20 mEq Oral Daily  . triamcinolone cream   Topical TID    Dialysis Orders: [facility] followed by Dr. Candiss Norse ([facility] [place]). 4 exchanges overnight, 2000 cc and 1.5 hrs each.  Assessment/Plan:  1.ESRD-ONCCPDin [place], [place], f/b Dr Candiss Norse of [place].Cont PD nightly4x 2000cc. Bloody effluent seen on takedown 4/15 and again 4/28 and 4/29. Not receiving heparin with PD. PD gram stain, cultures and cell count not consistent with infection.  Trend H/H.  Consider CT a/p if gross blood noted to Hb trending down.  09/01/20 plan for transition to HD d/t need for SNF placement. Will not be able to continue PD at SNF. Renal navigator aware. IR was consulted for placement of TDC. Today husband said we should  go ahead w/ plan for hemodialysis while at the SNF. Have asked IR to put her back on schedule for [facility]. HD most likely tomorrow.  2.Hypokalemia: chronic, repleting PRN. K+ 3.4, potassium chloride 65mEq daily started 5/1.  3.Vol overload-resolved/HTN-on admitsig LE dependent edema and UE edemaon admission.Volume statusresolvedsince admissionwith PD ,euvolemic on exam.Continues to have good UF with all 1.5% exchanges, will continue.On low-dose p.o. Lopressor 6.25 mg twice daily 4.Fall w/ subdural hematoma- sp SDH evacuation at [facility] on3/24/22 5.Anemia of ESRDHgb 11.3, on Aranesp 60 MCG q. Tuesday 6.MBD-phosphorus controlled without binder; corrected calcium mid 10s, no vitamin D 7.Nausea/ vomiting-intermittent,etiology unclear, uremiaunlikely.PD cell count in the past and culture  unrevealing 8. Malnutrition/ debility-verylow albumin. It has been d/w husband on several occassions in the past about a possible trial of HD due to low albumin in a PD patient. He had stated in the past they are not interested and feels she would not tolerate HD.RD on board 9.Multivessel CAD- with reduced EF 25-30% by recent cath in Feb 2022. Per TCTSwasnotCABG candidate. UnderwentPCI to LAD. At [facility] repeat echo per family showed improved EF 55%. 10. Chronichyponatremia-stable in high 120s, appears euvolemic. D/CdNaCl tabs for now and sodium has remained stable, follow labs.   11. Metabolic acidosis-bicarbhas been ok off supplement. 12.H/oSeizures-per primary team - vimpat was reduced 4/29 for side effects.  16 Debility: 2/2 Multiple med issues, most recently SDH recovery and NSTEMI w/ CAD and low EF. Readmitted to CIR on 08/04/20.Now planning discharge to SNF/ and likely transition to HD.  Lynnda Child PA-C [facility] 09/03/2020,9:51 AM  Pt seen, examined and agree w assess/plan as above with additions as indicated.  [facility] 09/03/2020, 3:39 PM

## 2020-09-03 NOTE — Progress Notes (Signed)
Speech Language Pathology Daily Session Note  Patient Details  Name: Amy [name] MRN: [id number] Date of Birth: Jul 08, 1950  Today's Date: 09/03/2020 SLP Individual Time: 52-1445 SLP Individual Time Calculation (min): 30 min  Short Term Goals: Week 5: SLP Short Term Goal 1 (Week 5): STG=LTG due to awaiting SNF placement  Skilled Therapeutic Interventions:Skilled ST services focused on cognitive skills. SLP facilitated recall of yesterday's card task, unable to locate memory notebook but could recall 3 rules with supervision verbal cues to use written aid. SLP also facilitated mildly complex problem solving and recall with written aid in familiar daily math questions from ALFA, pt demonstrated accuracy in 1 out 6 and became increasingly frustrated when SLP and daughter would provided support and cues to use written aid. SLP provided education to allow pt extra time, to think aloud to assist with memory and to allow assistance in mildly complex tasks, pt and family agreed. Pt was left in room with daughter, call bell within reach and chair alarm set. SLP recommends to continue skilled services.     Pain Pain Assessment Pain Score: 0-No pain  Therapy/Group: Individual Therapy  Darrin Apodaca  [facility] 09/03/2020, 2:53 PM

## 2020-09-03 NOTE — Progress Notes (Addendum)
Renal Navigator received a "High Acuity Checklist" from [facility], triggered from request for a hoyer lift noted on referral. Navigator has faxed PT/OT notes documenting patient's ambulatory status, inability to stand and pivot without assistance, and toileting assistance needs. Clinic is requesting information on how patient will transport to and from outpatient HD. Per CIR CSW notes, [facility] will not provide transportation and husband has been informed that he will need to arrange. Navigator has requested update on when this has been completed in order to provide this information to clinic.  Navigator spoke with Renal PA/G. Ejigiri to request that patient have at least one inpt HD in recliner to ensure that she can tolerate before discharge.  Navigator will continue to follow closely.  Alphonzo Cruise, [place] [facility] Navigator [phone]

## 2020-09-03 NOTE — Patient Care Conference (Signed)
Inpatient RehabilitationTeam Conference and Plan of Care Update Date: 09/03/2020   Time: 11:40 AM    Patient Name: Amy [name]      Medical Record Number: [id number]  Date of Birth: 23-Apr-1951 Sex: Female         Room/Bed: 4W06C/4W06C-01 Payor Info: Payor: MEDICARE / Plan: MEDICARE PART A AND B / Product Type: *No Product type* /    Admit Date/Time:  08/04/2020  1:38 PM  Primary Diagnosis:  SDH (subdural hematoma) [facility])  Hospital Problems: Principal Problem:   SDH (subdural hematoma) ([place]) Active Problems:   Subdural hemorrhage following injury ([place])   Acute on chronic anemia   Postoperative pain   Sleep disturbance    Expected Discharge Date: Expected Discharge Date:  (D/C pending HD cath placement)  Team Members Present: Physician leading conference: Dr. Delice Lesch Care Coodinator Present: Dorien Chihuahua, RN, BSN, CRRN;Becky Dupree, LCSW Nurse Present: Dorien Chihuahua, RN PT Present: Magda Kiel, PT OT Present: Simonne Come, OT SLP Present: Charolett Bumpers, SLP PPS Coordinator present : Ileana Ladd, PT     Current Status/Progress Goal Weekly Team Focus  Bowel/Bladder             Swallow/Nutrition/ Hydration             ADL's   UB bathing/dressing min A, LB bathing/dressing mod/ max A, toileting max/dep, SPT mod A, bed mobility min A  min/mod A  family education, OOB and activity tolerance, adl/transfer training   Mobility   mod A transfers, gait 10' with RW mod to max A, w/c mobility S 50', sitting balance S  min A overall  progressing activity tolerance, balance, standing tolerance, LE/core strength, gait as able   Communication             Safety/Cognition/ Behavioral Observations  mod A mildly complex problem solving, min-supervision  Supervision A, downgrade mildly compex problem to min A 5/4  mildly complex problem solving, recall, error awareness and sustained attention   Pain             Skin               Discharge Planning:  Plan to go to [facility]  for more rehab, will need to transition to HD and husband will be responsible for transport back and forth. Renal liaison working on OP-HD and slot   Team Discussion: Discussion of HD catheter placement placed on hold 09/02/20; pending with NPO. Able to go to SNF [facility])  with HD catheter however if PD not switched over, patient will need to go back home.   Patient on target to meet rehab goals: Currently mod assit for transfers, aable to ambulate 5' with min assist.  Manage w/c 60' with supervision. Requires min assist for upper body care and mod - max assist for lower body care however is dependent for toileting/hygiene. And mod assist for stand pivot transfers.   *See Care Plan and progress notes for long and short-term goals.   Revisions to Treatment Plan:  Upgraded goals to min assist for OT Downgraded SLP goals to mod assist for problem solving Working on [employer], error awareness and attention  Teaching Needs: Transfers, toileting, medications, etc.   Current Barriers to Discharge: Decreased caregiver support, Home enviroment access/layout and Peritoneal dialysis/hemodialysis  Possible Resolutions to Barriers: Switch over to HD from PD Hired caregivers to assist spouse with care     Medical Summary Current Status: Right hemiparesis and aphasia secondary to large left SDH  s/p craniotomy  Barriers to Discharge: Medical stability  Barriers to Discharge Comments: +PD. Possible Resolutions to [employer] Focus: Therapies, monitor for seizures, follow labs - Hb, awaiting HD cath   Continued Need for Acute Rehabilitation Level of Care: The patient requires daily medical management by a physician with specialized training in physical medicine and rehabilitation for the following reasons: Direction of a multidisciplinary physical rehabilitation program to maximize functional independence : Yes Medical management of patient stability for increased activity during participation in  an intensive rehabilitation regime.: Yes Analysis of laboratory values and/or radiology reports with any subsequent need for medication adjustment and/or medical intervention. : Yes   I attest that I was present, lead the team conference, and concur with the assessment and plan of the team.   Dorien Chihuahua B 09/03/2020, 2:36 PM

## 2020-09-03 NOTE — Progress Notes (Signed)
Referral submitted to [facility] to request modality change from PD to incenter HD in order to accommodate patient's discharge to SNF. Navigator has requested a seat at [facility] to keep her with the same dialysis company she was with for PD and have her close to [facility] in [place].  Navigator will follow closely and follow up with CIR CSW/B. Dupree regarding acceptance and schedule. Navigator's understanding from [place] is that [facility] will not transport and that patient's husband has been informed that he will have to arrange transportation for patient to/from outpatient HD.  Alphonzo Cruise, [place] [facility] Navigator [phone]

## 2020-09-03 NOTE — Plan of Care (Signed)
  Problem: RH Balance Goal: LTG Patient will maintain dynamic sitting balance (PT) Description: LTG:  Patient will maintain dynamic sitting balance with assistance during mobility activities (PT) Flowsheets (Taken 09/03/2020 1100) LTG: Pt will maintain dynamic sitting balance during mobility activities with:: Supervision/Verbal cueing Goal: LTG Patient will maintain dynamic standing balance (PT) Description: LTG:  Patient will maintain dynamic standing balance with assistance during mobility activities (PT) Flowsheets (Taken 09/03/2020 1100) LTG: Pt will maintain dynamic standing balance during mobility activities with:: Minimal Assistance - Patient > 75%   Problem: Sit to Stand Goal: LTG:  Patient will perform sit to stand with assistance level (PT) Description: LTG:  Patient will perform sit to stand with assistance level (PT) Flowsheets (Taken 09/03/2020 1100) LTG: PT will perform sit to stand in preparation for functional mobility with assistance level: (downgraded due to slow progress) Minimal Assistance - Patient > 75%   Problem: RH Bed Mobility Goal: LTG Patient will perform bed mobility with assist (PT) Description: LTG: Patient will perform bed mobility with assistance, with/without cues (PT). Flowsheets (Taken 09/03/2020 1100) LTG: Pt will perform bed mobility with assistance level of: Supervision/Verbal cueing   Problem: RH Bed to Chair Transfers Goal: LTG Patient will perform bed/chair transfers w/assist (PT) Description: LTG: Patient will perform bed to chair transfers with assistance (PT). Flowsheets (Taken 09/03/2020 1100) LTG: Pt will perform Bed to Chair Transfers with assistance level: (downgraded due to slow progress) Minimal Assistance - Patient > 75%   Problem: RH Car Transfers Goal: LTG Patient will perform car transfers with assist (PT) Description: LTG: Patient will perform car transfers with assistance (PT). Flowsheets (Taken 09/03/2020 1100) LTG: Pt will perform car  transfers with assist:: (downgraded due to slow progress) Moderate Assistance - Patient 50 - 74%   Problem: RH Ambulation Goal: LTG Patient will ambulate in controlled environment (PT) Description: LTG: Patient will ambulate in a controlled environment, # of feet with assistance (PT). Flowsheets (Taken 09/03/2020 1100) LTG: Pt will ambulate in controlled environ  assist needed:: Minimal Assistance - Patient > 75% LTG: Ambulation distance in controlled environment: (downgraded due to slow progress) 30' w/ LRAD Goal: LTG Patient will ambulate in home environment (PT) Description: LTG: Patient will ambulate in home environment, # of feet with assistance (PT). Flowsheets (Taken 09/03/2020 1100) LTG: Pt will ambulate in home environ  assist needed:: Minimal Assistance - Patient > 75% LTG: Ambulation distance in home environment: 27' w/ LRAD   Problem: RH Wheelchair Mobility Goal: LTG Patient will propel w/c in controlled environment (PT) Description: LTG: Patient will propel wheelchair in controlled environment, # of feet with assist (PT) Flowsheets (Taken 09/03/2020 1100) LTG: Pt will propel w/c in controlled environ  assist needed:: Supervision/Verbal cueing LTG: Propel w/c distance in controlled environment: 100'   Problem: RH Stairs Goal: LTG Patient will ambulate up and down stairs w/assist (PT) Description: LTG: Patient will ambulate up and down # of stairs with assistance (PT) Flowsheets (Taken 09/03/2020 1100) LTG: Pt will ambulate up/down stairs assist needed:: (downgraded due to slow progress) Moderate Assistance - Patient 50 - 74% LTG: Pt will  ambulate up and down number of stairs: 2 w/HR  Goals extended due to plans now for SNF and downgraded some due to slow progress. Magda Kiel, PT

## 2020-09-03 NOTE — Progress Notes (Signed)
Physical Therapy Weekly Progress Note  Patient Details  Name: Amy [name] MRN: [id number] Date of Birth: 10-22-1950  Beginning of progress report period: August 26, 2020 End of progress report period: Sep 03, 2020  Today's Date: 09/03/2020 PT Individual Time:Session1: 1040-1130; Session2: PT Individual Time Calculation (min): 50 min &  Patient has met 0 of 4 short term goals.  Patient is progressing despite not meeting goals this week.  She is still mostly mod A for transfers, but today able to ambulate 5' with RW with min A and demonstrating increased strength in R LE with exercises lifting antigravity.  She currently is undergoing possible transition to HD from PD with plans for transfer to SNF prior to d/c home.  Had recommended Palliative care consult for coordination of care and decisions.   Patient continues to demonstrate the following deficits muscle weakness, decreased coordination and decreased motor planning and decreased initiation, decreased attention, decreased problem solving, decreased safety awareness and decreased memory and therefore will continue to benefit from skilled PT intervention to increase functional independence with mobility.  Patient not progressing toward long term goals.  See goal revision..  Continue plan of care.  PT Short Term Goals Week 4:  PT Short Term Goal 1 (Week 4): Patient will perform at least 50% of transfers with min A. PT Short Term Goal 1 - Progress (Week 4): Progressing toward goal PT Short Term Goal 2 (Week 4): Patient will ambulate 68' with RW and mod A. PT Short Term Goal 2 - Progress (Week 4): Progressing toward goal PT Short Term Goal 3 (Week 4): Patient will propel w/c 100' with S PT Short Term Goal 3 - Progress (Week 4): Progressing toward goal PT Short Term Goal 4 (Week 4): Patient will stand for 1 minute for functional task with min A. PT Short Term Goal 4 - Progress (Week 4): Progressing toward goal Week 5:  PT Short Term Goal 1  (Week 5): Patient will perform transfers with min A at least 30% of the time. PT Short Term Goal 2 (Week 5): Patient will ambulate 26' with mod A with RW PT Short Term Goal 3 (Week 5): Patient will propel w/c 100' with S PT Short Term Goal 4 (Week 5): Patient will tolerate standing 1 minute with min to mod A for balance for functional task  Skilled Therapeutic Interventions/Progress Updates:  Session1: Patient in w/c in room and reports may still go today for dialysis port placement, but not sure, still has not eaten anything since two days ago.  Patient assisted in w/c to dayroom.  Sit to stand mod A to RW and stand step to Nu Step with min to mod A and mod cues.  Patient performed 2 x 2 minutes UE/LE on Nu Step at level 1.  Performed passive stretch to bilateral heel cords 2 x 30 sec holds.  Sit to stand and stand step to w/c with RW with mod A.  Assisted in w/c to room.  Patient sit to stand to RW mod A and ambulated 5' to recliner with RW with min A.  Patient seated in recliner to perform LAQ, hamstring curls with yellow t-band, hip flexion, hip abduction with orange t-band, tricep extensions and bicep curls with yellow t-band all x 10 seated in recliner.  Patient left with legs elevated, seat belt alarm active and all needs in reach.   Session2:Patient in recliner and reports feeling well. Still not had anything to eat and not sure when to have  HD port placed.  Patient sit to stand to RW and ambulated to w/c 5' with mod A.  Patient in w/c propelled x 50' with S and increased time, cues for pushing through fatigue.  Patient transported in w/c to ground level entrance for time in the sun.  Performed w/c mobility on level brick terrain with S x 20' prior to fatigue.  Patient transported back to rehab unit dayroom.  Performed standing at hi-lo table with min to mod A for balance while completing color pattern with pegs with mod verbal cues.  Patient in w/c assisted to room.  Performed stand pivot to bed  with mod A using bedrail.  Patient in supine performed SAQ w/ ankle DF and 5 sec hold x 10 on each.  Note on white board and pt reminder to have staff place PRAFO's on her feet at night.  Spoke with RN about ensuring pt had diet order put in if not going for HD port placement.  Patient left in bed in semi-chair position with all needs in reach and bed alarm active.   Ambulation/gait training;Balance/vestibular training;Cognitive remediation/compensation;Functional mobility training;Patient/family education;Therapeutic Exercise;UE/LE Coordination activities;Splinting/orthotics;Therapeutic Activities;Skin care/wound management;Pain management;Discharge planning;DME/adaptive equipment instruction;Neuromuscular re-education;Stair training;UE/LE Strength taining/ROM;Wheelchair propulsion/positioning   Therapy Documentation Precautions:  Precautions Precautions: Fall Precaution Comments: seizure precautions Restrictions Weight Bearing Restrictions: No Other Position/Activity Restrictions: head incision Pain: Pain Assessment Pain Score: 0-No pain  Therapy/Group: Individual Therapy  Rentchler, PT 09/03/2020, 10:59 AM

## 2020-09-03 NOTE — Plan of Care (Signed)
  Problem: RH Problem Solving Goal: LTG Patient will demonstrate problem solving for (SLP) Description: LTG:  Patient will demonstrate problem solving for basic/complex daily situations with cues  (SLP) Flowsheets (Taken 09/03/2020 1141) LTG Patient will demonstrate problem solving for: (downgraded due to poor carryover with recall) Minimal Assistance - Patient > 75% Note: Downgraded with poor carryover with recall

## 2020-09-03 NOTE — Progress Notes (Signed)
Patient ID: Amy [name], female   DOB: 07-05-1950, 70 y.o.   MRN: [id number]  Spoke with husband via telephone and daughter was here and spoke with Pam-PA regarding placing the cath today. Husband spoke with nephrologist and feels his questions have been answered and wants to go forward with plan for cath placement today. He is aware of the need for him to set up transport for her. Renal navigator setting up OP-HD. Aware plan for early next week for transfer to [place].

## 2020-09-03 NOTE — Progress Notes (Signed)
Nutrition Follow-up  DOCUMENTATION CODES:   Underweight,Severe malnutrition in context of chronic illness  INTERVENTION:   -When diet resumed, continueMagicCup TID with meals, each supplement provides 290 kcal and 9 grams of protein  - When diet resumed, continue chocolate milk TID with mealsper pt request  -When diet resumed, continue vanilla ice cream TID with meals (to mix with orange cream Magic Cups)  - Encourage adequate PO  NUTRITION DIAGNOSIS:   Severe Malnutrition related to chronic illness (ESRD) as evidenced by severe fat depletion,severe muscle depletion.  Ongoing, being addressed via supplements  GOAL:   Patient will meet greater than or equal to 90% of their needs  Unmet, pt is NPO for tunneled dialysis catheter placement  MONITOR:   PO intake,Supplement acceptance,Labs,Weight trends,I & O's  REASON FOR ASSESSMENT:   Other (history of malnutrition)    ASSESSMENT:   70 year old female with PMH of ESRD on PD, chronic hyponatremia, C diff colitis, seizures, NSTEMI s/p PCI w/ DAPT, chronic issues with N/V resulting in debility and CIR stay 3/04-3/24/22. Pt was discharged to home and experienced a mild fall that afternoon followed by obtundation that evening. Pt was evaluated at [facility] 07/24/20 and found to have acute on chronic left SDH. Pt was transferred to [facility] and underwent left partial craniotomy for evacuation of SDH with placement of subdural drain which was removed on 3/28. Cortrak placed at [facility] for nutrition support. Admitted to CIR on 4/04.  Pt has now experienced a 13.4 kg weight loss since 4/04. This is a 24.1% weight loss in 1 month which is severe and significant for timeframe.  Noted plans for pt to d/c to SNF and to transition from PD to HD.  Pt is currently NPO and has been since 0001 on 09/03/20. Noted plan was for [facility] placement yesterday; however, this was not done as pt's husband had additional questions. Per most recent note, plan is  for [facility] placement today so pt will remain NPO until after procedure.  Spoke with pt at bedside who was frustrated and concerned about being NPO for so long. Pt states that she is thirsty and she is hungry. Discussed with RN who was trying to get pt's diet re-ordered if plan was to hold off on [facility] another day.  CIR admission weight: 55.5 kg Current weight:42.1 kg  Meal Completion: 0-60%  Medications reviewed and include: aranesp, colace, melatonin, reglan TID, rena-vit, protonix, klor-con  Labs reviewed: sodium 126, potassium 3.4  Diet Order:   Diet Order            Diet NPO time specified Except for: Sips with Meds  Diet effective midnight                 EDUCATION NEEDS:   Education needs have been addressed  Skin:  Skin Assessment: Skin Integrity Issues: Incisions: head  Last BM:  09/02/20  Height:   Ht Readings from Last 1 Encounters:  08/04/20 5\' 4"  (1.626 m)    Weight:   Wt Readings from Last 1 Encounters:  09/03/20 42.1 kg    BMI:  Body mass index is 15.93 kg/m.  Estimated Nutritional Needs:   Kcal:  1700-1900  Protein:  85-100 grams  Fluid:  1000 ml + UOP    Gustavus Bryant, MS, RD, LDN Inpatient Clinical Dietitian Please see AMiON for contact information.

## 2020-09-03 NOTE — Progress Notes (Signed)
Occupational Therapy Session Note  Patient Details  Name: Amy [name] MRN: [id number] Date of Birth: 1950/10/01  Today's Date: 09/03/2020 OT Individual Time: 7225-7505 OT Individual Time Calculation (min): 45 min    Short Term Goals: Week 4:  OT Short Term Goal 1 (Week 4): Patient will complete bed mobility with min A OT Short Term Goal 1 - Progress (Week 4): Met OT Short Term Goal 2 (Week 4): patient will complete toileting with mod A OT Short Term Goal 2 - Progress (Week 4): Progressing toward goal OT Short Term Goal 3 (Week 4): patient will complete sit to stand and SPT with RW consistent min/mod A OT Short Term Goal 3 - Progress (Week 4): Progressing toward goal  Skilled Therapeutic Interventions/Progress Updates:    Patient found semifowlers in bed on bedpan with completed BM and reporting no pain. Patient incontinent and continent of BM in session. Following bedpan removal, patient performed bed mobility rolling L and R CS. Dependent for donning brief and perihygiene. All mobility and adl tasks requiring extra time for processing and encouragement. Supine>sitting EOB with HOB elevated 30 degrees using bed rails min A with vc's for hand placement; min A and cueing required to scoot to EOB. BLEs wrapped in ace wrap. Patient reported continued BM once sitting EOB requiring brief doffing/donning and perihygiene in stance with RW. Sit<>stand x2 Mod A with RW and vc's for body mechanics. LB dressing max A sitting and standing; UB dressing min A seated. Stand pivot transfer mod A with RW bed>w/c. In w/c patient completed grooming tasks at set up level (wash face, apply lotion). UB conditioning activities completed w/c level to improve UB strength and activity tolerance to improve independence in ADL performance and reduce caregiver burden. Patient left sitting in w/c with safety belt activated, call bell in reach, and all needs met. Patient requested water; discussed with nurse who informed patient  is currently NPO and only taking small sips of water per MD for meds due to pending HD cath placement. Water removed from tray.  Therapy Documentation Precautions:  Precautions Precautions: Fall Precaution Comments: seizure precautions Restrictions Weight Bearing Restrictions: No Other Position/Activity Restrictions: head incision    Vital Signs: EOB BP: 122/72  Therapy/Group: Individual Therapy  Mellissa Kohut 09/03/2020, 12:43 PM

## 2020-09-03 NOTE — Progress Notes (Signed)
Amy [name] PHYSICAL MEDICINE & REHABILITATION PROGRESS NOTE  Subjective/Complaints: Patient seen sitting up in bed this morning.  She states she slept fairly overnight.  She continues to await HD cath placement.  She was seen by nephrology yesterday, notes reviewed-no changes.  ROS: Denies CP, SOB, nausea, vomiting, diarrhea  Objective: Vital Signs: Blood pressure 131/76, pulse 78, temperature 97.9 F (36.6 C), temperature source Oral, resp. rate 16, height 5\' 4"  (1.626 m), weight 42.1 kg, SpO2 97 %. No results found. Recent Labs    09/01/20 0548  WBC 9.4  HGB 11.3*  HCT 33.8*  PLT 242   Recent Labs    09/01/20 0548  NA 126*  K 3.4*  CL 91*  CO2 24  GLUCOSE 84  BUN 18  CREATININE 3.12*  CALCIUM 8.2*   No intake or output data in the 24 hours ending 09/03/20 1034      Physical Exam: BP 131/76 (BP Location: Right Arm)   Pulse 78   Temp 97.9 F (36.6 C) (Oral)   Resp 16   Ht 5\' 4"  (1.626 m)   Wt 42.1 kg   SpO2 97%   BMI 15.93 kg/m   Constitutional: No distress . Vital signs reviewed. HENT: Normocephalic.  Atraumatic. Eyes: EOMI. No discharge. Cardiovascular: No JVD.  RRR. Respiratory: Normal effort.  No stridor.  Bilateral clear to auscultation. GI: Non-distended.  BS +.  + PD site. Skin: Warm and dry.  Intact. Psych: Normal mood.  Normal behavior. Musc: No edema in extremities.  No tenderness in extremities. Neuro: Alert Motor: LUE: 4/5 proximal distal, stable RUE: 4/5 proximal distal LLE: Hip flexion 4/5, knee extension 4/5, ankle dorsiflexion 4-/5, stable RLE: Hip flexion 4/5, knee extension 4/5, ankle dorsiflexion 3-3+/5  Assessment/Plan: 1. Functional deficits which require 3+ hours per day of interdisciplinary therapy in a comprehensive inpatient rehab setting.  Physiatrist is providing close team supervision and 24 hour management of active medical problems listed below.  Physiatrist and rehab team continue to assess barriers to  discharge/monitor patient progress toward functional and medical goals   Care Tool:  Bathing    Body parts bathed by patient: Right arm,Left arm,Chest,Abdomen,Front perineal area,Face,Right upper leg,Left upper leg,Buttocks,Right lower leg,Left lower leg   Body parts bathed by helper: Right lower leg,Left lower leg,Buttocks     Bathing assist Assist Level: Moderate Assistance - Patient 50 - 74%     Upper Body Dressing/Undressing Upper body dressing   What is the patient wearing?: Pull over shirt    Upper body assist Assist Level: Minimal Assistance - Patient > 75%    Lower Body Dressing/Undressing Lower body dressing      What is the patient wearing?: Pants     Lower body assist Assist for lower body dressing: Moderate Assistance - Patient 50 - 74%     Toileting Toileting    Toileting assist Assist for toileting: Maximal Assistance - Patient 25 - 49%     Transfers Chair/bed transfer  Transfers assist     Chair/bed transfer assist level: Moderate Assistance - Patient 50 - 74% Chair/bed transfer assistive device: Walker,Armrests   Locomotion Ambulation   Ambulation assist   Ambulation activity did not occur: Safety/medical concerns  Assist level: Moderate Assistance - Patient 50 - 74% Assistive device: Walker-rolling Max distance: 10   Walk 10 feet activity   Assist  Walk 10 feet activity did not occur: Safety/medical concerns  Assist level: Moderate Assistance - Patient - 50 - 74% Assistive device: Walker-rolling  Walk 50 feet activity   Assist Walk 50 feet with 2 turns activity did not occur: Safety/medical concerns         Walk 150 feet activity   Assist Walk 150 feet activity did not occur: Safety/medical concerns         Walk 10 feet on uneven surface  activity   Assist Walk 10 feet on uneven surfaces activity did not occur: Safety/medical concerns   Assist level: Moderate Assistance - Patient - 50 - 74% Assistive  device: Aeronautical engineer Will patient use wheelchair at discharge?: Yes Type of Wheelchair: Manual    Wheelchair assist level: Supervision/Verbal cueing Max wheelchair distance: 40'    Wheelchair 50 feet with 2 turns activity    Assist    Wheelchair 50 feet with 2 turns activity did not occur: Safety/medical concerns   Assist Level: Supervision/Verbal cueing   Wheelchair 150 feet activity     Assist  Wheelchair 150 feet activity did not occur: Safety/medical concerns        Medical Problem List and Plan: 1.  Right hemiparesis and aphasia secondary to large left SDH s/p craniotomy  Continue CIR  Team conference today to discuss current and goals and coordination of care, home and environmental barriers, and discharge planning with nursing, case manager, and therapies. Please see conference note from today as well.  2.  Impaired mobility -DVT/anticoagulation:  Mechanical: Continue Sequential compression devices, below knee Bilateral lower extremities             -antiplatelet therapy: N/A due to bleed.  3. Chronic migraines/ Pain Management: Continue Fioricet 3-4 times a day on average. Added prn Fioricet q8H  Controlled with meds on 5/4  Monitor with increased exertion 4. Mood: LCSW to follow for evaluation and support.  -antipsychotic agents: N/A 5. Neuropsych: This patient is not fully capable of making decisions on her own behalf. 6. Skin/Wound Care: Monitor wound for healing.             -Family to provide meals from home as able.  -bilateral PRAFOs ordered but patient refuses to wear 7. Fluids/Electrolytes/Nutrition: Continue PD--does not need fluid restriction.              --has had chronic issues with variable intake as well as N/V likely due to gastroparesis.  8. Chronic Hypotension: Continue TEDs when out of bed             Vitals:   09/03/20 0456 09/03/20 0715  BP: 126/72 131/76  Pulse: 79 78  Resp: 16   Temp: 98.2 F  (36.8 C) 97.9 F (36.6 C)  SpO2: 99% 97%   Controlled on 5/4 9.  ESRD   Transition from PD to HD.   Awaiting HD cath placement  10.  Recurrent Seizures: Has been seizure free, continue Brivaracetam and Lacosamide bid.              --Lacosamide was decreased on 03/31 to avoid sedative SE, decreased again on 4/28- appreciate neurology recommendations..  No breakthrough seizures since admission- 5/4 11. CAD/NSTEMI s/p PCI: Treated medically.   Off DAPT due to bleed   Continue Lipitor.   Metoprolol low-dose resumed  Heart rate controlled on 5/4 12. Acute on chronic anemia:   Hemoglobin 11.3 on 5/2  Continue to monitor 13. H/o Depression: Continue Celexa 14. Sick Euthyroid: Will need follow up as illness resolves.              --  TSH- 5.80/Free T4-1.2 (03/27) 15. Gastroparesis w/chronic N/V/anorexia: Has been ongoing per records review.              --question failure of PD (husband reports no N/V with brief episode of HD in the past)  Continue Reglan 5 3 times daily  Hesitant to continue increase medications due to #18  Improved overall 16.  Hyponatremia             --chronic hyponatremia with Na-130-133 range  Recs per nephro             --Continue Sodium bicarb to manage metabolic acidosis. 17.  Severe hypoalbuminemia  Supplement d/ced given nausea and vomiting- recommended nuts as source of protein. Her daughter has brought in.  18.  Prolonged QTC  ECG reviewed, improved since last visit  Compazine and dose/frequency decreased 19.  Sleep disturbance- "never a good sleeper"  Cont trazodone and melatonin    At baseline 20.  Hypokalemia  Potassium 3.4 on 5/2  Scheduled supplement ordered.   LOS: 30 days A FACE TO FACE EVALUATION WAS PERFORMED  Amy [name] Lorie Phenix 09/03/2020, 10:34 AM

## 2020-09-04 ENCOUNTER — Inpatient Hospital Stay (HOSPITAL_COMMUNITY): Payer: Medicare Other

## 2020-09-04 DIAGNOSIS — D649 Anemia, unspecified: Secondary | ICD-10-CM | POA: Diagnosis not present

## 2020-09-04 DIAGNOSIS — G479 Sleep disorder, unspecified: Secondary | ICD-10-CM | POA: Diagnosis not present

## 2020-09-04 DIAGNOSIS — G8918 Other acute postprocedural pain: Secondary | ICD-10-CM | POA: Diagnosis not present

## 2020-09-04 DIAGNOSIS — S065X9A Traumatic subdural hemorrhage with loss of consciousness of unspecified duration, initial encounter: Secondary | ICD-10-CM | POA: Diagnosis not present

## 2020-09-04 HISTORY — PX: IR FLUORO GUIDE CV LINE RIGHT: IMG2283

## 2020-09-04 HISTORY — PX: IR US GUIDE VASC ACCESS RIGHT: IMG2390

## 2020-09-04 LAB — CBC
HCT: 34.4 % — ABNORMAL LOW (ref 36.0–46.0)
Hemoglobin: 11.8 g/dL — ABNORMAL LOW (ref 12.0–15.0)
MCH: 33 pg (ref 26.0–34.0)
MCHC: 34.3 g/dL (ref 30.0–36.0)
MCV: 96.1 fL (ref 80.0–100.0)
Platelets: 216 10*3/uL (ref 150–400)
RBC: 3.58 MIL/uL — ABNORMAL LOW (ref 3.87–5.11)
RDW: 17 % — ABNORMAL HIGH (ref 11.5–15.5)
WBC: 12.2 10*3/uL — ABNORMAL HIGH (ref 4.0–10.5)
nRBC: 0 % (ref 0.0–0.2)

## 2020-09-04 LAB — RENAL FUNCTION PANEL
Albumin: 1.1 g/dL — ABNORMAL LOW (ref 3.5–5.0)
Anion gap: 8 (ref 5–15)
BUN: 27 mg/dL — ABNORMAL HIGH (ref 8–23)
CO2: 24 mmol/L (ref 22–32)
Calcium: 8.1 mg/dL — ABNORMAL LOW (ref 8.9–10.3)
Chloride: 92 mmol/L — ABNORMAL LOW (ref 98–111)
Creatinine, Ser: 3.82 mg/dL — ABNORMAL HIGH (ref 0.44–1.00)
GFR, Estimated: 12 mL/min — ABNORMAL LOW (ref >60)
Glucose, Bld: 86 mg/dL (ref 70–99)
Phosphorus: 5.4 mg/dL — ABNORMAL HIGH (ref 2.5–4.6)
Potassium: 4.7 mmol/L (ref 3.5–5.1)
Sodium: 124 mmol/L — ABNORMAL LOW (ref 135–145)

## 2020-09-04 LAB — HEPATITIS B SURFACE ANTIBODY, QUANTITATIVE: Hep B S AB Quant (Post): 12.2 m[IU]/mL (ref >9.9)

## 2020-09-04 MED ORDER — FENTANYL CITRATE (PF) 100 MCG/2ML IJ SOLN
INTRAMUSCULAR | Status: AC | PRN
  Administered 2020-09-04: 25 ug via INTRAVENOUS

## 2020-09-04 MED ORDER — LIDOCAINE-EPINEPHRINE 1 %-1:100000 IJ SOLN
INTRAMUSCULAR | Status: AC
  Filled 2020-09-04: qty 1

## 2020-09-04 MED ORDER — VANCOMYCIN HCL 1000 MG/200ML IV SOLN
1000.0000 mg | INTRAVENOUS | Status: AC
  Administered 2020-09-04: 1000 mg via INTRAVENOUS
  Filled 2020-09-04: qty 200

## 2020-09-04 MED ORDER — MIDAZOLAM HCL 2 MG/2ML IJ SOLN
INTRAMUSCULAR | Status: AC | PRN
  Administered 2020-09-04 (×2): 0.5 mg via INTRAVENOUS

## 2020-09-04 MED ORDER — SODIUM CHLORIDE 0.9 % IV SOLN: 100.0000 mL | INTRAVENOUS | Status: DC | PRN

## 2020-09-04 MED ORDER — HEPARIN SODIUM (PORCINE) 1000 UNIT/ML DIALYSIS: 1000.0000 [IU] | INTRAMUSCULAR | Status: DC | PRN

## 2020-09-04 MED ORDER — LIDOCAINE HCL 1 % IJ SOLN
INTRAMUSCULAR | Status: AC
  Filled 2020-09-04: qty 20

## 2020-09-04 MED ORDER — MIDAZOLAM HCL 2 MG/2ML IJ SOLN
INTRAMUSCULAR | Status: AC
  Filled 2020-09-04: qty 2

## 2020-09-04 MED ORDER — VANCOMYCIN HCL IN DEXTROSE 1-5 GM/200ML-% IV SOLN
INTRAVENOUS | Status: AC
  Filled 2020-09-04: qty 200

## 2020-09-04 MED ORDER — FENTANYL CITRATE (PF) 100 MCG/2ML IJ SOLN
INTRAMUSCULAR | Status: AC
  Filled 2020-09-04: qty 2

## 2020-09-04 MED ORDER — LIDOCAINE-EPINEPHRINE 1 %-1:100000 IJ SOLN
INTRAMUSCULAR | Status: AC | PRN
  Administered 2020-09-04: 10 mL

## 2020-09-04 MED ORDER — HEPARIN SODIUM (PORCINE) 1000 UNIT/ML IJ SOLN
INTRAMUSCULAR | Status: AC
  Filled 2020-09-04: qty 1

## 2020-09-04 MED ORDER — CHLORHEXIDINE GLUCONATE CLOTH 2 % EX PADS
6.0000 | MEDICATED_PAD | Freq: Every day | CUTANEOUS | Status: DC
  Administered 2020-09-04 – 2020-09-15 (×11): 6 via TOPICAL

## 2020-09-04 NOTE — Progress Notes (Signed)
Renal Navigator received call from [facility] stating she has not received H&P or HD orders. Renal Navigator faxed these again (initially faxed 09/03/20 at 11:34am). She also requested address and phone number of patient's secondary insurance, which Navigator provided from patient's facesheet.   Alphonzo Cruise, [place] Renal Navigator [phone]

## 2020-09-04 NOTE — Progress Notes (Signed)
Occupational Therapy Session Note  Patient Details  Name: Amy [name] MRN: [id number] Date of Birth: 1950/11/16  Today's Date: 09/04/2020 OT Missed Time: 42 Minutes Missed Time Reason: Out of hospital appointment;Unavailable (comment) (off the unit for dialysis)  Pt currently off the unit for dialysis. Pt missed 30 minutes of skilled OT treatment session. Will follow up with patient at another time.   Daneen Schick Turhan Chill 09/04/2020, 2:11 PM

## 2020-09-04 NOTE — Progress Notes (Signed)
[facility] PHYSICAL MEDICINE & REHABILITATION PROGRESS NOTE  Subjective/Complaints: Patient seen laying in bed this morning.  She states she slept well overnight.  She has questions regarding catheter placement.  Discussed most recent update.  She is seen by nephrology yesterday, notes reviewed-discussed with husband plans to proceed with cath placement.  ROS: Denies CP, SOB, nausea, vomiting, diarrhea  Objective: Vital Signs: Blood pressure 124/60, pulse 76, temperature 98.8 F (37.1 C), temperature source Oral, resp. rate 16, height 5\' 4"  (1.626 m), weight 42.2 kg, SpO2 100 %. No results found. No results for input(s): WBC, HGB, HCT, PLT in the last 72 hours. Recent Labs    09/04/20 0601  NA 124*  K 4.7  CL 92*  CO2 24  GLUCOSE 86  BUN 27*  CREATININE 3.82*  CALCIUM 8.1*    Intake/Output Summary (Last 24 hours) at 09/04/2020 0924 Last data filed at 09/03/2020 1835 Gross per 24 hour  Intake 120 ml  Output --  Net 120 ml        Physical Exam: BP 124/60 (BP Location: Right Arm)   Pulse 76   Temp 98.8 F (37.1 C) (Oral)   Resp 16   Ht 5\' 4"  (1.626 m)   Wt 42.2 kg   SpO2 100%   BMI 15.97 kg/m   Constitutional: No distress . Vital signs reviewed. HENT: Normocephalic.  Atraumatic. Eyes: EOMI. No discharge. Cardiovascular: No JVD.  RRR. Respiratory: Normal effort.  No stridor.  Bilateral clear to auscultation. GI: Non-distended.  BS +.  + PD site. Skin: Warm and dry.  Intact. Psych: Normal mood.  Normal behavior. Musc: No edema in extremities.  No tenderness in extremities. Neuro: Alert Motor: LUE: 4/5 proximal distal, stable RUE: 4/5 proximal distal LLE: Hip flexion 4/5, knee extension 4/5, ankle dorsiflexion 4-/5, unchanged RLE: Hip flexion 4/5, knee extension 4/5, ankle dorsiflexion 3-3+/5  Assessment/Plan: 1. Functional deficits which require 3+ hours per day of interdisciplinary therapy in a comprehensive inpatient rehab setting.  Physiatrist is providing  close team supervision and 24 hour management of active medical problems listed below.  Physiatrist and rehab team continue to assess barriers to discharge/monitor patient progress toward functional and medical goals   Care Tool:  Bathing    Body parts bathed by patient: Right arm,Left arm,Chest,Abdomen,Front perineal area,Face,Right upper leg,Left upper leg,Buttocks,Right lower leg,Left lower leg   Body parts bathed by helper: Right lower leg,Left lower leg,Buttocks     Bathing assist Assist Level: Moderate Assistance - Patient 50 - 74%     Upper Body Dressing/Undressing Upper body dressing   What is the patient wearing?: Pull over shirt    Upper body assist Assist Level: Minimal Assistance - Patient > 75%    Lower Body Dressing/Undressing Lower body dressing      What is the patient wearing?: Pants     Lower body assist Assist for lower body dressing: Maximal Assistance - Patient 25 - 49%     Toileting Toileting    Toileting assist Assist for toileting: Maximal Assistance - Patient 25 - 49%     Transfers Chair/bed transfer  Transfers assist     Chair/bed transfer assist level: Moderate Assistance - Patient 50 - 74% Chair/bed transfer assistive device: Walker,Armrests   Locomotion Ambulation   Ambulation assist   Ambulation activity did not occur: Safety/medical concerns  Assist level: Moderate Assistance - Patient 50 - 74% Assistive device: Walker-rolling Max distance: 10   Walk 10 feet activity   Assist  Walk 10 feet  activity did not occur: Safety/medical concerns  Assist level: Moderate Assistance - Patient - 50 - 74% Assistive device: Walker-rolling   Walk 50 feet activity   Assist Walk 50 feet with 2 turns activity did not occur: Safety/medical concerns         Walk 150 feet activity   Assist Walk 150 feet activity did not occur: Safety/medical concerns         Walk 10 feet on uneven surface  activity   Assist Walk 10  feet on uneven surfaces activity did not occur: Safety/medical concerns   Assist level: Moderate Assistance - Patient - 50 - 74% Assistive device: Aeronautical engineer Will patient use wheelchair at discharge?: Yes Type of Wheelchair: Manual    Wheelchair assist level: Supervision/Verbal cueing Max wheelchair distance: 40'    Wheelchair 50 feet with 2 turns activity    Assist    Wheelchair 50 feet with 2 turns activity did not occur: Safety/medical concerns   Assist Level: Supervision/Verbal cueing   Wheelchair 150 feet activity     Assist  Wheelchair 150 feet activity did not occur: Safety/medical concerns        Medical Problem List and Plan: 1.  Right hemiparesis and aphasia secondary to large left SDH s/p craniotomy  Continue CIR 2.  Impaired mobility -DVT/anticoagulation:  Mechanical: Continue Sequential compression devices, below knee Bilateral lower extremities             -antiplatelet therapy: N/A due to bleed.  3. Chronic migraines/ Pain Management: Continue Fioricet 3-4 times a day on average. Added prn Fioricet q8H  Controlled with meds on 5/5  Monitor with increased exertion 4. Mood: LCSW to follow for evaluation and support.  -antipsychotic agents: N/A 5. Neuropsych: This patient is not fully capable of making decisions on her own behalf. 6. Skin/Wound Care: Monitor wound for healing.             -Family to provide meals from home as able.  -bilateral PRAFOs ordered but patient refuses to wear 7. Fluids/Electrolytes/Nutrition: Continue PD--does not need fluid restriction.              --has had chronic issues with variable intake as well as N/V likely due to gastroparesis.  8. Chronic Hypotension: Continue TEDs when out of bed             Vitals:   09/03/20 1913 09/04/20 0402  BP: 114/60 124/60  Pulse: 86 76  Resp: 16 16  Temp: 98.4 F (36.9 C) 98.8 F (37.1 C)  SpO2: 100% 100%   Controlled on 5/5 9.  ESRD    Transition from PD to HD.   Continues to await HD cath placement  10.  Recurrent Seizures: Has been seizure free, continue Brivaracetam and Lacosamide bid.              --Lacosamide was decreased on 03/31 to avoid sedative SE, decreased again on 4/28- appreciate neurology recommendations..  No breakthrough seizures since admission- 5/5 11. CAD/NSTEMI s/p PCI: Treated medically.   Off DAPT due to bleed   Continue Lipitor.   Metoprolol low-dose resumed  Heart rate controlled on 5/5 12. Acute on chronic anemia:   Hemoglobin 11.3 on 5/2  Continue to monitor 13. H/o Depression: Continue Celexa 14. Sick Euthyroid: Will need follow up as illness resolves.              --TSH- 5.80/Free T4-1.2 (03/27) 15. Gastroparesis w/chronic N/V/anorexia: Has been  ongoing per records review.              --question failure of PD (husband reports no N/V with brief episode of HD in the past)  Continue Reglan 5 3 times daily  Hesitant to continue increase medications due to #18  Improved overall 16.  Hyponatremia             --chronic hyponatremia with Na-130-133 range  Recs per nephro             --Continue Sodium bicarb to manage metabolic acidosis. 17.  Severe hypoalbuminemia  Supplement d/ced given nausea and vomiting- recommended nuts as source of protein. Her daughter has brought in.  18.  Prolonged QTC  ECG reviewed, improved since last visit  Compazine and dose/frequency decreased 19.  Sleep disturbance- "never a good sleeper"  Cont trazodone and melatonin    At baseline 20.  Hypokalemia  Potassium 4.7 on 5/5  Scheduled supplement DC'd.   LOS: 31 days A FACE TO FACE EVALUATION WAS PERFORMED  Trinitie Mcgirr Lorie Phenix 09/04/2020, 9:24 AM

## 2020-09-04 NOTE — Progress Notes (Signed)
Today's Date: 09/04/2020 PT Missed Time: 30 Minutes Missed Time Reason: Unavailable (Comment);Out of hospital appointment   Patient off the floor for hemodialysis.  Will follow up at another time.  Magda Kiel, PT

## 2020-09-04 NOTE — Progress Notes (Signed)
Occupational Therapy Session Note  Patient Details  Name: ELLA GOLOMB MRN: [id number] Date of Birth: 1950/05/12  Today's Date: 09/04/2020 OT Missed Time: 58 Minutes Missed Time Reason: Unavailable (comment) (HD cath placement)   Pt off unit for HD cath placement and missed 60 min skilled OT. Will follow up per availability/POC    Tonny Branch 09/04/2020, 6:47 AM

## 2020-09-04 NOTE — Progress Notes (Signed)
Physical Therapy Session Note  Patient Details  Name: Amy [name] MRN: [id number] Date of Birth: January 09, 1951  Today's Date: 09/04/2020 PT Individual Time:  -      Short Term Goals: Week 5:  PT Short Term Goal 1 (Week 5): Patient will perform transfers with min A at least 30% of the time. PT Short Term Goal 2 (Week 5): Patient will ambulate 17' with mod A with RW PT Short Term Goal 3 (Week 5): Patient will propel w/c 100' with S PT Short Term Goal 4 (Week 5): Patient will tolerate standing 1 minute with min to mod A for balance for functional task  Skilled Therapeutic Interventions/Progress Updates:   Pt recently returned from Hemodialysis catheter placement. Per MD, pt should refrain from physical activity s/p 1 hour following procedure. Will re-attempt at later time/date, provided pt in medically appropriate.      Therapy Documentation Precautions:  Precautions Precautions: Fall Precaution Comments: seizure precautions Restrictions Weight Bearing Restrictions: No Other Position/Activity Restrictions: head incision  Therapy/Group: Individual Therapy  Lorie Phenix 09/04/2020, 1:51 PM

## 2020-09-04 NOTE — Progress Notes (Signed)
[facility] Progress Note   Subjective:    Seen in room. Husband at bedside. No complaints other than frustrated waiting for catheter and wants to eat. Dialysis today after cath placement.   Objective Vitals:   09/04/20 1020 09/04/20 1030 09/04/20 1035 09/04/20 1040  BP: (!) 143/79 (!) 146/80 136/75 126/67  Pulse: 67 72 74 89  Resp: 16 16 12 12   Temp:      TempSrc:      SpO2: 100% 100% 100% 100%  Weight:      Height:       Physical Exam General:Alert, thin female in NAD Heart:RRR, no MRG Lungs:nonlabored breathing, lungs CTA anteriorly Abdomen:Bowel sounds normoactive, soft NTND no ascites, PD catheter present Extremities:No LE edema  Dialysis Access:PD catheter exit site clean dry   Additional Objective Labs: Basic Metabolic Panel: Recent Labs  Lab 08/29/20 0534 08/30/20 0538 09/01/20 0548 09/04/20 0601  NA 127* 126* 126* 124*  K 3.5 3.3* 3.4* 4.7  CL 92* 91* 91* 92*  CO2 22 24 24 24   GLUCOSE 86 88 84 86  BUN 17 18 18  27*  CREATININE 2.80* 2.90* 3.12* 3.82*  CALCIUM 8.3* 8.3* 8.2* 8.1*  PHOS 4.5 4.5  --  5.4*   Liver Function Tests: Recent Labs  Lab 08/29/20 0534 08/30/20 0538 09/04/20 0601  ALBUMIN 1.2* 1.2* 1.1*   CBC: Recent Labs  Lab 08/30/20 0538 09/01/20 0548  WBC 10.3 9.4  HGB 10.8* 11.3*  HCT 32.4* 33.8*  MCV 96.7 98.5  PLT 230 242   Blood Culture    Component Value Date/Time   SDES FLUID 08/28/2020 1007   SPECREQUEST PERITONEAL DIALYSIS 08/28/2020 1007   CULT  08/28/2020 1007    NO GROWTH 3 DAYS Performed at [facility], [place] [address] [place], [place] [address]    REPTSTATUS 08/31/2020 FINAL 08/28/2020 1007   Medications: . sodium chloride    . sodium chloride    . dialysis solution 1.5% low-MG/low-CA    . vancomycin     . aspirin EC  81 mg Oral Daily  . atorvastatin  40 mg Oral Daily  . brivaracetam  75 mg Oral BID  . citalopram  10 mg Oral Daily  . [START ON 09/09/2020] darbepoetin  (ARANESP) injection - NON-DIALYSIS  60 mcg Subcutaneous Q Tue-1800  . docusate sodium  100 mg Oral BID  . fentaNYL      . gentamicin cream  1 application Topical Daily  . heparin  5,000 Units Subcutaneous Q8H  . heparin sodium (porcine)      . kidney failure book   Does not apply Once  . lacosamide  50 mg Oral BID  . lidocaine      . lidocaine-EPINEPHrine      . melatonin  5 mg Oral QHS  . metoCLOPramide  5 mg Oral TID AC  . metoprolol tartrate  6.25 mg Oral BID  . midazolam      . multivitamin  1 tablet Oral QHS  . pantoprazole  40 mg Oral Daily  . triamcinolone cream   Topical TID    Dialysis Orders: [place] followed by Dr. Candiss Norse ([place]). 4 exchanges overnight, 2000 cc and 1.5 hrs each.  Assessment/Plan:  1.ESRD[place]PD[place], [place], f/b Dr Candiss Norse of [place].Cont PD nightly4x 2000cc. Bloody effluent seen on takedown 4/15 and again 4/28 and 4/29. Not receiving heparin with PD. PD gram stain, cultures and cell count not consistent with infection.   09/01/20 plan for transition  to HD d/t need for SNF placement. Will not be able to continue PD at SNF. Renal navigator aware. IR was consulted for placement of TDC. For catheter placement today (5/5) and dialysis in recliner today.  2.Hypokalemia: chronic. Potassium chloride 61mEq daily started 5/1. Stopped 5/5 K+ 4.7.  3.Vol overload-resolved/HTN-on admitsig LE dependent edema and UE edemaon admission.Volume statusresolvedsince admissionwith PD ,euvolemic on exam.On low-dose p.o. Lopressor 6.25 mg twice daily 4.Fall w/ subdural hematoma- sp SDH evacuation at [facility] on3/24/22 5.Anemia of ESRDHgb 11.3, on Aranesp 60 MCG q. Tuesday 6.MBD-phosphorus controlled without binder; corrected calcium mid 10s, no vitamin D 7.Nausea/ vomiting-intermittent,etiology unclear, uremiaunlikely.PD cell count in the past and culture unrevealing. Has been better this week.  8. Malnutrition/ debility-verylow  albumin. It has been d/w husband on several occassions in the past about a possible trial of HD due to low albumin in a PD patient. He had stated in the past they are not interested, Now transitioning to HD d/t need for SNF placement.  9.Multivessel CAD- with reduced EF 25-30% by recent cath in Feb 2022. Per TCTSwasnotCABG candidate. UnderwentPCI to LAD. At [facility] repeat echo per family showed improved EF 55%. 10. Chronichyponatremia-stable in high 120s, appears euvolemic. D/CdNaCl tabs for now and sodium has remained stable, follow labs.   11. Metabolic acidosis-bicarbhas been ok off supplement. 12.H/oSeizures-per primary team - vimpat was reduced 4/29 for side effects.  24 Debility: 2/2 Multiple med issues, most recently SDH recovery and NSTEMI w/ CAD and low EF. Readmitted to CIR on 08/04/20.Now planning discharge to SNF/ and  transition to HD.  Lynnda Child PA-C [facility] 09/04/2020,10:54 AM  Pt seen, examined and agree w assess/plan as above with additions as indicated.  [facility] 09/04/2020, 10:54 AM

## 2020-09-04 NOTE — Progress Notes (Addendum)
Patient ID: Amy [name], female   DOB: July 13, 1950, 70 y.o.   MRN: [id number]  Met with husband when here to discuss information from [facility] and the OP-HD center-[facility] in [place]. Cath to be placed today-pt is relieved and will be glad to be able to eat since this has taken longer than she thought it would to place. Husband aware will be next week before wife is transferred to [facility], some concerns expressed from OP-HD center regarding her ability to transfer to recliner chair.  11:19 AM Spoke with [facility] who will need slot and chair for pt so can see if they can transport or will need to set up transportation agency. Have requested slot and chair time from Renal Navigator-Colleen. Made aware likely to go to [facility] to stay with [facility]. Will continue to work on transfer to [facility] will probably sometime next week before all can be arranged and coordinated.

## 2020-09-04 NOTE — Procedures (Signed)
Interventional Radiology Procedure Note  Procedure: Tunneled hemodialysis catheter placement  Findings: Please refer to procedural dictation for full description. 14.5 Fr, dual lumen, 19 cm tunneled HD catheter placed in right IJ, tip at cavoatrial junction.  Complications: None immediate  Estimated Blood Loss: < 5 mL  Recommendations: Catheter ready for immediate use.   Ruthann Cancer, MD Pager: [phone]

## 2020-09-05 DIAGNOSIS — S065X9A Traumatic subdural hemorrhage with loss of consciousness of unspecified duration, initial encounter: Secondary | ICD-10-CM | POA: Diagnosis not present

## 2020-09-05 DIAGNOSIS — G8918 Other acute postprocedural pain: Secondary | ICD-10-CM | POA: Diagnosis not present

## 2020-09-05 DIAGNOSIS — G479 Sleep disorder, unspecified: Secondary | ICD-10-CM | POA: Diagnosis not present

## 2020-09-05 DIAGNOSIS — D72829 Elevated white blood cell count, unspecified: Secondary | ICD-10-CM

## 2020-09-05 DIAGNOSIS — D649 Anemia, unspecified: Secondary | ICD-10-CM | POA: Diagnosis not present

## 2020-09-05 MED ORDER — DARBEPOETIN ALFA 60 MCG/0.3ML IJ SOSY
60.0000 ug | PREFILLED_SYRINGE | INTRAMUSCULAR | Status: DC
  Filled 2020-09-05: qty 0.3

## 2020-09-05 NOTE — Progress Notes (Signed)
Occupational Therapy Session Note  Patient Details  Name: ALIVIYAH MALANGA MRN: [id number] Date of Birth: Oct 14, 1950  Today's Date: 09/05/2020 OT Individual Time: 1345-1440 23 min    Missed 20 min skilled OT d/t renal coordinator meeting with pt.   Short Term Goals: Week 5:  OT Short Term Goal 1 (Week 5): Patient will complete SPT with consistent min A OT Short Term Goal 2 (Week 5): Patient will complete toileting with mod A OT Short Term Goal 3 (Week 5): patient will complete lower body dressing with min A  Skilled Therapeutic Interventions/Progress Updates:    Pt received semifowlers in bed w/ daughter at bedside reporting fatigue and pain (unrated) at new HD cath site received yesterday and headache (rated 8). Pt declined bathing and dressing tasks and became agitated as daughter encouraged participation. Daughter left session and pt agreed to sit EOB. Doffed socks supervision; ace wraps donned dependently; donned nonskid socks mod A due to fatigue. Supine<>sit x3 max-total A. Slight decrease in static sitting balance EOB with increased R lean. Pt doffed robe supervision; donned shirt mod A due to fatigue. Washed face and applied lotion/toner with supervision. Pt reported dizziness and fatigue; however, encouraged toileting at [facility]. Stand pivot transfer max A bed<>BSC with increased vc's for body mechanics, hand placement, and steps. Pt continent of bladder requiring max A for peri hygiene. Bed mobility scooting to [facility] max-total A in trend using L bedrail (reporting R arm too sore to lift from cath). Pt left semifowlers in bed with NT in room, all needs met.  Therapy Documentation Precautions:  Precautions Precautions: Fall Precaution Comments: seizure precautions Restrictions Weight Bearing Restrictions: No Other Position/Activity Restrictions: head incision   Pain: Pain Assessment Pain Scale: 0-10 Pain Score: 8  Pain Location: Head Pain Descriptors / Indicators: Sore Pain Onset:  On-going Pain Intervention(s): Distraction   Therapy/Group: Individual Therapy  Mellissa Kohut 09/05/2020, 2:42 PM

## 2020-09-05 NOTE — Progress Notes (Signed)
Patient ID: QUANITA BARONA, female   DOB: 1950/08/19, 70 y.o.   MRN: [id number]  This SW covering for primary SW, [facility].  SW informed pt dialysis days will now be MWF beginning on Monday. Dialysis seat- [facility] ([place] location) 11:30am; arrival by 11:15am. SW called Admissions Coordinator Andrea/[facility] [facility] to inform, and see if updates on transportation arrangement, and she is out of the office until Monday. SW updated [facility] will follow-up next week. SW updated scheduling on change in pt dialysis dates beginning on Monday and pt second shift dialysis tomorrow.   Loralee Pacas, MSW, [place] Office: [phone] Cell: [phone] Fax: [phone]

## 2020-09-05 NOTE — Progress Notes (Addendum)
[facility] Progress Note   Subjective:  Seen in room. S/p [facility] placement yesterday - followed by dialysis. Did well, didn't even remember dialysis. No CP or dyspnea.  Objective Vitals:   09/04/20 1525 09/04/20 1641 09/04/20 2000 09/05/20 0617  BP: 122/61 129/64 129/65 126/72  Pulse: 84 79 83 94  Resp: _0 Temp: 98 F (36.7 C) 98.1 F (36.7 C)  98.1 F (36.7 C)  TempSrc: Oral Oral    SpO2: 98% 98% 100% 100%  Weight: 41 kg     Height:       Physical Exam General: Well appearing woman, NAD Heart: RRR; no murmur Lungs: CTA anteriorly Abdomen: soft, non-tender, PD cath in place Extremities: No LE edema Dialysis Access: R IJ TDC, non-tender  Additional Objective Labs: Basic Metabolic Panel: Recent Labs  Lab 08/30/20 0538 09/01/20 0548 09/04/20 0601  NA 126* 126* 124*  K 3.3* 3.4* 4.7  CL 91* 91* 92*  CO2 _1 GLUCOSE 88 84 86  BUN 18 18 27*  CREATININE 2.90* 3.12* 3.82*  CALCIUM 8.3* 8.2* 8.1*  PHOS 4.5  --  5.4*   Liver Function Tests: Recent Labs  Lab 08/30/20 0538 09/04/20 0601  ALBUMIN 1.2* 1.1*   CBC: Recent Labs  Lab 08/30/20 0538 09/01/20 0548 09/04/20 1812  WBC 10.3 9.4 12.2*  HGB 10.8* 11.3* 11.8*  HCT 32.4* 33.8* 34.4*  MCV 96.7 98.5 96.1  PLT 230 242 216   Studies/Results: IR Fluoro Guide CV Line Right  Result Date: 09/04/2020 INDICATION: 70 year old female with end-stage renal disease requiring central venous access for hemodialysis. EXAM: TUNNELED CENTRAL VENOUS HEMODIALYSIS CATHETER PLACEMENT WITH ULTRASOUND AND FLUOROSCOPIC GUIDANCE MEDICATIONS: Vancomycin 1 gm IV . The antibiotic was given in an appropriate time interval prior to skin puncture. ANESTHESIA/SEDATION: Moderate (conscious) sedation was employed during this procedure. A total of Versed 1 mg and Fentanyl 25 mcg was administered intravenously. Moderate Sedation Time: 13 minutes. The patient's level of consciousness and vital signs were monitored  continuously by radiology nursing throughout the procedure under my direct supervision. FLUOROSCOPY TIME:  0 minutes 6 seconds (1 mGy). COMPLICATIONS: None immediate. PROCEDURE: Informed written consent was obtained from the patient after a discussion of the risks, benefits, and alternatives to treatment. Questions regarding the procedure were encouraged and answered. The right neck and chest were prepped with chlorhexidine in a sterile fashion, and a sterile drape was applied covering the operative field. Maximum barrier sterile technique with sterile gowns and gloves were used for the procedure. A timeout was performed prior to the initiation of the procedure. After creating a small venotomy incision, a 21 gauge micropuncture kit was utilized to access the internal jugular vein. Real-time ultrasound guidance was utilized for vascular access including the acquisition of a permanent ultrasound image documenting patency of the accessed vessel. A Rosen wire was advanced to the level of the IVC and the micropuncture sheath was exchanged for an 8 Fr dilator. A 14.5 French tunneled hemodialysis catheter measuring 19 cm from tip to cuff was tunneled in a retrograde fashion from the anterior chest wall to the venotomy incision. Serial dilation was then performed an a peel-away sheath was placed. The catheter was then placed through the peel-away sheath with the catheter tip ultimately positioned within the right atrium. Final catheter positioning was confirmed and documented with a spot radiographic image. The catheter aspirates and flushes normally. The catheter was flushed with appropriate volume heparin dwells. The catheter exit site was  secured with a 0-Silk retention suture. The venotomy incision was closed with Dermabond. Sterile dressings were applied. The patient tolerated the procedure well without immediate post procedural complication. IMPRESSION: Successful placement of 19 cm tip to cuff tunneled hemodialysis  catheter via the right internal jugular vein with catheter tip terminating within the right atrium. The catheter is ready for immediate use. Ruthann Cancer, MD [facility] Radiology Electronically Signed   By: Ruthann Cancer MD   On: 09/04/2020 11:10   IR US Guide Vasc Access Right  Result Date: 09/04/2020 INDICATION: 70 year old female with end-stage renal disease requiring central venous access for hemodialysis. EXAM: TUNNELED CENTRAL VENOUS HEMODIALYSIS CATHETER PLACEMENT WITH ULTRASOUND AND FLUOROSCOPIC GUIDANCE MEDICATIONS: Vancomycin 1 gm IV . The antibiotic was given in an appropriate time interval prior to skin puncture. ANESTHESIA/SEDATION: Moderate (conscious) sedation was employed during this procedure. A total of Versed 1 mg and Fentanyl 25 mcg was administered intravenously. Moderate Sedation Time: 13 minutes. The patient's level of consciousness and vital signs were monitored continuously by radiology nursing throughout the procedure under my direct supervision. FLUOROSCOPY TIME:  0 minutes 6 seconds (1 mGy). COMPLICATIONS: None immediate. PROCEDURE: Informed written consent was obtained from the patient after a discussion of the risks, benefits, and alternatives to treatment. Questions regarding the procedure were encouraged and answered. The right neck and chest were prepped with chlorhexidine in a sterile fashion, and a sterile drape was applied covering the operative field. Maximum barrier sterile technique with sterile gowns and gloves were used for the procedure. A timeout was performed prior to the initiation of the procedure. After creating a small venotomy incision, a 21 gauge micropuncture kit was utilized to access the internal jugular vein. Real-time ultrasound guidance was utilized for vascular access including the acquisition of a permanent ultrasound image documenting patency of the accessed vessel. A Rosen wire was advanced to the level  of the IVC and the micropuncture sheath was exchanged for an 8 Fr dilator. A 14.5 French tunneled hemodialysis catheter measuring 19 cm from tip to cuff was tunneled in a retrograde fashion from the anterior chest wall to the venotomy incision. Serial dilation was then performed an a peel-away sheath was placed. The catheter was then placed through the peel-away sheath with the catheter tip ultimately positioned within the right atrium. Final catheter positioning was confirmed and documented with a spot radiographic image. The catheter aspirates and flushes normally. The catheter was flushed with appropriate volume heparin dwells. The catheter exit site was secured with a 0-Silk retention suture. The venotomy incision was closed with Dermabond. Sterile dressings were applied. The patient tolerated the procedure well without immediate post procedural complication. IMPRESSION: Successful placement of 19 cm tip to cuff tunneled hemodialysis catheter via the right internal jugular vein with catheter tip terminating within the right atrium. The catheter is ready for immediate use. Ruthann Cancer, MD [facility] Radiology Electronically Signed   By: Ruthann Cancer MD   On: 09/04/2020 11:10   Medications: . dialysis solution 1.5% low-MG/low-CA     . aspirin EC  81 mg Oral Daily  . atorvastatin  40 mg Oral Daily  . brivaracetam  75 mg Oral BID  . Chlorhexidine Gluconate Cloth  6 each Topical Daily  . citalopram  10 mg Oral Daily  . [START ON 09/09/2020] darbepoetin (ARANESP) injection - DIALYSIS  60 mcg Intravenous Q Tue-HD  . docusate sodium  100 mg Oral BID  . gentamicin cream  1 application Topical Daily  . heparin  5,000 Units Subcutaneous Q8H  . kidney failure book   Does not apply Once  . lacosamide  50 mg Oral BID  . melatonin  5 mg Oral QHS  . metoCLOPramide  5 mg Oral TID AC  . metoprolol tartrate  6.25 mg Oral BID  . multivitamin  1 tablet Oral QHS   . pantoprazole  40 mg Oral Daily  . triamcinolone cream   Topical TID    Dialysis Orders: [facility] followed by Dr. Candiss Norse ([place]). 4 exchanges overnight, 2000 cc and 1.5 hrs each.  Assessment/Plan:  1.ESRD: [place], [place], f/b Dr Candiss Norse of [place].Cont PD nightly4x 2000cc. Bloody effluent seen on takedown 4/15and again 4/28 and 4/29. Not receiving heparin with PD.PD gram stain, cultures and cell count not consistent with infection.D/t plan for SNF, she was transitioned to HD. S/p [facility] placement 5/5 with HD afterwards -> next HD tomorrow (5/7) then plan to change to MWF next week. 2. Hypokalemia: chronic while on PD - should improve on HD, follow. 3. HTN/volume: Significant overload on admit, now euvolemic.EDW will be around 41kg. 4.Fall w/ subdural hematoma: S/p SDH evacuation at [facility] on3/24/22 5. Anemia of ESRD: Hgb up to 11.8, getting ESA q Tuesday - will plan to hold next dose if Hgb > 11. 6.MBD: CorrCa mid 10's, Phos ok. No binder or VDRA, follow. 7.Nausea/ vomiting: Intermittent, using Reglan TID. 8. Malnutrition/ debility: Alb very low. Previously had refused changing to HD, now agreeable since will be going to SNF. Has not tolerated protein supplements. 9.Multivessel CAD/HFrEF (25-30% LHC 06/2020): Per TCTSwasnotCABG candidate. UnderwentPCI to LAD. At [facility] repeat echo per family showed improved EF 55%. 10. Chronichyponatremia: Stable and euvolemic appearing. Off Na tabs. 11. Metabolic acidosis: Stable, off bicarb. 12.HxSeizures-per primary team- vimpat was reduced 4/29 for side effects.  42 Debility: 2/2 Multiple med issues, most recently SDH recovery and NSTEMI w/ CAD and low EF. Readmitted to CIR on 08/04/20.Now planning discharge to SNF with transition to HD.  Veneta Penton, PA-C 09/05/2020, 9:29 AM  [employer]

## 2020-09-05 NOTE — Progress Notes (Signed)
Patient has been accepted, pending documentation of ability to tolerate HD in recliner, at [facility] on a MWF schedule with a seat time of 11:30am. She needs to arrive at 11:15am to her appointments.  Navigator informed Renal PA and CIR CSW/A. Barbra Sarks.  Navigator will follow for timing of discharge to SNF and keep clinic updated on start date.   Alphonzo Cruise, [place] Renal Navigator [phone]

## 2020-09-05 NOTE — Progress Notes (Signed)
Renal Navigator spoke with patient and her daughter/Hannah at beside to discuss outpatient HD schedule. Both patient and daughter are perfectly agreeable to HD at [facility] clinic, however, Navigator notes that patient's PD catheter is being left in, and therefore, she will need weekly catheter flushes. This cannot be done at [facility] and will need to be done at the clinic in [place] asked daughter if [place] will be providing transportation and she states they will not. She informed Navigator that her father has made some phone calls and is arranging transportation for patient. Patient asked if she could receive HD and flushes at the clinic in [place]. Given that patient/family are putting their own transportation place, and patient is agreeable to receiving HD at a clinic farther from SNF, we all agreed that it would make the most sense for her to go to [facility] in [place]. Patient added that she would be interested in remaining with her Nephrologist in [place], Dr. Candiss Norse. Both patient and daughter were both very appreciative, and again stated that if HD in [place] is not possible at this time, patient is agreeable to HD at the [place] clinic. We will then need to arrange flushes in at the [place] clinic.  Navigator informed patient of the need for HD in a recliner for tomorrow's treatment and she was understanding and agreeable. Navigator also explained that she will need to ensure that her SNF has sent her to HD with a hoyer lift pad (CIR CSW B. Dupree has been informed of this) to ensure that if a lift is needed for transfer from wheelchair to HD seat, she is equipped. Patient and daughter thanked Navigator for the information and stated understanding.  Navigator called Taylor/Admissions Coordinator with [facility] and explained the situation. She states she will send the information over the the clinic in [place] immediately and call the clinic for follow up. She will  call Navigator back as soon as possible. She has requested a MWF schedule at the [place] clinic if possible. Navigator appreciative.  Navigator updated CIR CSW/A. Barbra Sarks. Navigator will follow closely.   Alphonzo Cruise, [place] Renal Navigator [phone]

## 2020-09-05 NOTE — Progress Notes (Signed)
PHYSICAL MEDICINE & REHABILITATION PROGRESS NOTE  Subjective/Complaints: Patient seen laying in bed this AM.  She had her HD cath placed yesterday. She states she slept well overnight and has questions regarding discharge.   ROS: Denies CP, SOB, nausea, vomiting, diarrhea  Objective: Vital Signs: Blood pressure 126/72, pulse 94, temperature 98.1 F (36.7 C), resp. rate 18, height 5' 4"  (1.626 m), weight 41 kg, SpO2 100 %. IR Fluoro Guide CV Line Right  Result Date: 09/04/2020 INDICATION: 70 year old female with end-stage renal disease requiring central venous access for hemodialysis. EXAM: TUNNELED CENTRAL VENOUS HEMODIALYSIS CATHETER PLACEMENT WITH ULTRASOUND AND FLUOROSCOPIC GUIDANCE MEDICATIONS: Vancomycin 1 gm IV . The antibiotic was given in an appropriate time interval prior to skin puncture. ANESTHESIA/SEDATION: Moderate (conscious) sedation was employed during this procedure. A total of Versed 1 mg and Fentanyl 25 mcg was administered intravenously. Moderate Sedation Time: 13 minutes. The patient's level of consciousness and vital signs were monitored continuously by radiology nursing throughout the procedure under my direct supervision. FLUOROSCOPY TIME:  0 minutes 6 seconds (1 mGy). COMPLICATIONS: None immediate. PROCEDURE: Informed written consent was obtained from the patient after a discussion of the risks, benefits, and alternatives to treatment. Questions regarding the procedure were encouraged and answered. The right neck and chest were prepped with chlorhexidine in a sterile fashion, and a sterile drape was applied covering the operative field. Maximum barrier sterile technique with sterile gowns and gloves were used for the procedure. A timeout was performed prior to the initiation of the procedure. After creating a small venotomy incision, a 21 gauge micropuncture kit was utilized to access the internal jugular vein. Real-time ultrasound guidance was utilized for vascular  access including the acquisition of a permanent ultrasound image documenting patency of the accessed vessel. A Rosen wire was advanced to the level of the IVC and the micropuncture sheath was exchanged for an 8 Fr dilator. A 14.5 French tunneled hemodialysis catheter measuring 19 cm from tip to cuff was tunneled in a retrograde fashion from the anterior chest wall to the venotomy incision. Serial dilation was then performed an a peel-away sheath was placed. The catheter was then placed through the peel-away sheath with the catheter tip ultimately positioned within the right atrium. Final catheter positioning was confirmed and documented with a spot radiographic image. The catheter aspirates and flushes normally. The catheter was flushed with appropriate volume heparin dwells. The catheter exit site was secured with a 0-Silk retention suture. The venotomy incision was closed with Dermabond. Sterile dressings were applied. The patient tolerated the procedure well without immediate post procedural complication. IMPRESSION: Successful placement of 19 cm tip to cuff tunneled hemodialysis catheter via the right internal jugular vein with catheter tip terminating within the right atrium. The catheter is ready for immediate use. Ruthann Cancer, MD [facility] Radiology Electronically Signed   By: Ruthann Cancer MD   On: 09/04/2020 11:10   IR US Guide Vasc Access Right  Result Date: 09/04/2020 INDICATION: 70 year old female with end-stage renal disease requiring central venous access for hemodialysis. EXAM: TUNNELED CENTRAL VENOUS HEMODIALYSIS CATHETER PLACEMENT WITH ULTRASOUND AND FLUOROSCOPIC GUIDANCE MEDICATIONS: Vancomycin 1 gm IV . The antibiotic was given in an appropriate time interval prior to skin puncture. ANESTHESIA/SEDATION: Moderate (conscious) sedation was employed during this procedure. A total of Versed 1 mg and Fentanyl 25 mcg was administered intravenously.  Moderate Sedation Time: 13 minutes. The patient's level of consciousness and vital signs were monitored continuously by radiology nursing  throughout the procedure under my direct supervision. FLUOROSCOPY TIME:  0 minutes 6 seconds (1 mGy). COMPLICATIONS: None immediate. PROCEDURE: Informed written consent was obtained from the patient after a discussion of the risks, benefits, and alternatives to treatment. Questions regarding the procedure were encouraged and answered. The right neck and chest were prepped with chlorhexidine in a sterile fashion, and a sterile drape was applied covering the operative field. Maximum barrier sterile technique with sterile gowns and gloves were used for the procedure. A timeout was performed prior to the initiation of the procedure. After creating a small venotomy incision, a 21 gauge micropuncture kit was utilized to access the internal jugular vein. Real-time ultrasound guidance was utilized for vascular access including the acquisition of a permanent ultrasound image documenting patency of the accessed vessel. A Rosen wire was advanced to the level of the IVC and the micropuncture sheath was exchanged for an 8 Fr dilator. A 14.5 French tunneled hemodialysis catheter measuring 19 cm from tip to cuff was tunneled in a retrograde fashion from the anterior chest wall to the venotomy incision. Serial dilation was then performed an a peel-away sheath was placed. The catheter was then placed through the peel-away sheath with the catheter tip ultimately positioned within the right atrium. Final catheter positioning was confirmed and documented with a spot radiographic image. The catheter aspirates and flushes normally. The catheter was flushed with appropriate volume heparin dwells. The catheter exit site was secured with a 0-Silk retention suture. The venotomy incision was closed with Dermabond. Sterile dressings were applied. The patient tolerated the procedure well without immediate  post procedural complication. IMPRESSION: Successful placement of 19 cm tip to cuff tunneled hemodialysis catheter via the right internal jugular vein with catheter tip terminating within the right atrium. The catheter is ready for immediate use. Ruthann Cancer, MD [facility] Radiology Electronically Signed   By: Ruthann Cancer MD   On: 09/04/2020 11:10   Recent Labs    09/04/20 1812  WBC 12.2*  HGB 11.8*  HCT 34.4*  PLT 216   Recent Labs    09/04/20 0601  NA 124*  K 4.7  CL 92*  CO2 24  GLUCOSE 86  BUN 27*  CREATININE 3.82*  CALCIUM 8.1*    Intake/Output Summary (Last 24 hours) at 09/05/2020 0910 Last data filed at 09/04/2020 2300 Gross per 24 hour  Intake 4820 ml  Output 351 ml  Net 4469 ml        Physical Exam: BP 126/72 (BP Location: Left Arm)   Pulse 94   Temp 98.1 F (36.7 C)   Resp 18   Ht 5' 4"  (1.626 m)   Wt 41 kg   SpO2 100%   BMI 15.52 kg/m   Constitutional: No distress . Vital signs reviewed. HENT: Normocephalic.  Atraumatic. Eyes: EOMI. No discharge. Cardiovascular: No JVD.  RRR. Respiratory: Normal effort.  No stridor.  Bilateral clear to auscultation. GI: Non-distended.  BS +. +PD site.  Skin: Warm and dry.  Intact. Psych: Normal mood.  Normal behavior. Musc: No edema in extremities.  No tenderness in extremities. Neuro: Alert Motor: LUE: 4/5 proximal distal, unchanged RUE: 4/5 proximal distal LLE: Hip flexion 4/5, knee extension 4/5, ankle dorsiflexion 4-/5, unchanged RLE: Hip flexion 4/5, knee extension 4/5, ankle dorsiflexion 3-3+/5  Assessment/Plan: 1. Functional deficits which require 3+ hours per day of interdisciplinary therapy in a comprehensive inpatient rehab setting.  Physiatrist is providing close team supervision and 24 hour management of  active medical problems listed below.  Physiatrist and rehab team continue to assess barriers to discharge/monitor patient progress toward  functional and medical goals   Care Tool:  Bathing    Body parts bathed by patient: Right arm,Left arm,Chest,Abdomen,Front perineal area,Face,Right upper leg,Left upper leg,Buttocks,Right lower leg,Left lower leg   Body parts bathed by helper: Right lower leg,Left lower leg,Buttocks     Bathing assist Assist Level: Moderate Assistance - Patient 50 - 74%     Upper Body Dressing/Undressing Upper body dressing   What is the patient wearing?: Pull over shirt    Upper body assist Assist Level: Minimal Assistance - Patient > 75%    Lower Body Dressing/Undressing Lower body dressing      What is the patient wearing?: Pants     Lower body assist Assist for lower body dressing: Maximal Assistance - Patient 25 - 49%     Toileting Toileting    Toileting assist Assist for toileting: Maximal Assistance - Patient 25 - 49%     Transfers Chair/bed transfer  Transfers assist     Chair/bed transfer assist level: Moderate Assistance - Patient 50 - 74% Chair/bed transfer assistive device: Walker,Armrests   Locomotion Ambulation   Ambulation assist   Ambulation activity did not occur: Safety/medical concerns  Assist level: Moderate Assistance - Patient 50 - 74% Assistive device: Walker-rolling Max distance: 10   Walk 10 feet activity   Assist  Walk 10 feet activity did not occur: Safety/medical concerns  Assist level: Moderate Assistance - Patient - 50 - 74% Assistive device: Walker-rolling   Walk 50 feet activity   Assist Walk 50 feet with 2 turns activity did not occur: Safety/medical concerns         Walk 150 feet activity   Assist Walk 150 feet activity did not occur: Safety/medical concerns         Walk 10 feet on uneven surface  activity   Assist Walk 10 feet on uneven surfaces activity did not occur: Safety/medical concerns   Assist level: Moderate Assistance - Patient - 50 - 74% Assistive device: Horticulturist, commercial Will patient use wheelchair at discharge?: Yes Type of Wheelchair: Manual    Wheelchair assist level: Supervision/Verbal cueing Max wheelchair distance: 40'    Wheelchair 50 feet with 2 turns activity    Assist    Wheelchair 50 feet with 2 turns activity did not occur: Safety/medical concerns   Assist Level: Supervision/Verbal cueing   Wheelchair 150 feet activity     Assist  Wheelchair 150 feet activity did not occur: Safety/medical concerns        Medical Problem List and Plan: 1.  Right hemiparesis and aphasia secondary to large left SDH s/p craniotomy  Continue CIR 2.  Impaired mobility -DVT/anticoagulation:  Mechanical: Continue Sequential compression devices, below knee Bilateral lower extremities             -antiplatelet therapy: N/A due to bleed.  3. Chronic migraines/ Pain Management: Continue Fioricet 3-4 times a day on average. Added prn Fioricet q8H  Controlled with meds on 5/6  Monitor with increased exertion 4. Mood: LCSW to follow for evaluation and support.  -antipsychotic agents: N/A 5. Neuropsych: This patient is not fully capable of making decisions on her own behalf. 6. Skin/Wound Care: Monitor wound for healing.             -Family to provide meals from home as able.  -bilateral PRAFOs ordered but patient refuses  to wear 7. Fluids/Electrolytes/Nutrition: Continue PD--does not need fluid restriction.              --has had chronic issues with variable intake as well as N/V likely due to gastroparesis.  8. Chronic Hypotension: Continue TEDs when out of bed             Vitals:   09/04/20 2000 09/05/20 0617  BP: 129/65 126/72  Pulse: 83 94  Resp: 17 18  Temp:  98.1 F (36.7 C)  SpO2: 100% 100%   Controlled on 5/6 9.  ESRD   Transitioning from PD to HD.  HD cath placed on 5/5 10.  Recurrent Seizures: Has been seizure free, continue Brivaracetam and Lacosamide bid.              --Lacosamide was decreased on  03/31 to avoid sedative SE, decreased again on 4/28- appreciate neurology recommendations..  No breakthrough seizures since admission- 5/6 11. CAD/NSTEMI s/p PCI: Treated medically.   Off DAPT due to bleed   Continue Lipitor.   Metoprolol low-dose resumed  Heart rate controlled on 5/6 12. Acute on chronic anemia:   Hemoglobin 11.8 on 5/5  Continue to monitor 13. H/o Depression: Continue Celexa 14. Sick Euthyroid: Will need follow up as illness resolves.              --TSH- 5.80/Free T4-1.2 (03/27) 15. Gastroparesis w/chronic N/V/anorexia: Has been ongoing per records review.              --question failure of PD (husband reports no N/V with brief episode of HD in the past)  Continue Reglan 5 3 times daily  Hesitant to continue increase medications due to #18  Improved overall 16.  Hyponatremia             Chronic hyponatremia with Na-130-133 range  Recs per nephro             --Continue Sodium bicarb to manage metabolic acidosis. 17.  Severe hypoalbuminemia  Supplement d/ced given nausea and vomiting- recommended nuts as source of protein. Her daughter has brought in.  18.  Prolonged QTC  ECG reviewed, improved since last visit  Compazine and dose/frequency decreased 19.  Sleep disturbance- "never a good sleeper"  Cont trazodone and melatonin    At baseline 20.  Hypokalemia  Potassium 4.7 on 5/5  Scheduled supplement DC'd.  21. Leukocytosis  WBCs 12.2 on 5/5  Afebrile  No signs/symptoms of infection  Cont to monitor  LOS: 32 days A FACE TO FACE EVALUATION WAS PERFORMED  Lagretta Loseke Lorie Phenix 09/05/2020, 9:10 AM

## 2020-09-05 NOTE — Progress Notes (Signed)
Occupational Therapy Session Note  Patient Details  Name: Amy [name] MRN: [id number] Date of Birth: Dec 15, 1950  Today's Date: 09/05/2020 OT Individual Time: 1106-1150 OT Individual Time Calculation (min): 44 min    Short Term Goals: Week 1:  OT Short Term Goal 1 (Week 1): patient will complete LB adl bed level with mod A OT Short Term Goal 1 - Progress (Week 1): Met OT Short Term Goal 2 (Week 1): Patient will complete UB adl w/c level with min A OT Short Term Goal 2 - Progress (Week 1): Met OT Short Term Goal 3 (Week 1): Patient will complete bed mobility with mod A OT Short Term Goal 3 - Progress (Week 1): Progressing toward goal OT Short Term Goal 4 (Week 1): patient will complete functional sit or stand pivot transfer with mod A OT Short Term Goal 4 - Progress (Week 1): Progressing toward goal  Skilled Therapeutic Interventions/Progress Updates:    Pt received in bed and consented to OT tx. Pt refused washing up/ADLs/ any OOB activity, only consented to in bed light BUE exercises. Pt instructed in light orange theraband exercises to increase strength and activity tolerance for ADLs. Instructed in elbow flexion and extension for 3x10 each side. Pt is very slow moving, requires increased time, and req mod verbal and tactile cuing for proper technique with fair carryover. Pt encouraged to sit EOB with min A. Instructed in static and dynamic sitting balance while EOB by reaching for objects, maintained good balance throughout with SUP. Pt instructed in STS in order to straighten out sheets. Pt req mod A to come all the way to standing, min -mod A for balance with rolling walker. Pt requested to sit after 5 seconds of standing due to fatigue. While EOB, pt donned bathrobe with setup. Pt then requested to lie back down and discontinue therapy session due to fatigue. After tx, pt left semifowler in bed with alarm on and all needs met.   Therapy Documentation Precautions:   Precautions Precautions: Fall Precaution Comments: seizure precautions Restrictions Weight Bearing Restrictions: No Other Position/Activity Restrictions: head incision Pain: Pain Assessment Pain Scale: 0-10 Pain Score: 0-No pain   Therapy/Group: Individual Therapy  Irby Fails 09/05/2020, 11:19 AM

## 2020-09-05 NOTE — Progress Notes (Signed)
Physical Therapy Session Note  Patient Details  Name: Amy [name] MRN: [id number] [name] of Birth: 1951-02-21  Today's [name]: 09/05/2020 PT Individual Time: 1004-1058 PT Individual Time Calculation (min): 54 min   Short Term Goals: Week 5:  PT Short Term Goal 1 (Week 5): Patient will perform transfers with min A at least 30% of the time. PT Short Term Goal 2 (Week 5): Patient will ambulate 2' with mod A with RW PT Short Term Goal 3 (Week 5): Patient will propel w/c 100' with S PT Short Term Goal 4 (Week 5): Patient will tolerate standing 1 minute with min to mod A for balance for functional task  Skilled Therapeutic Interventions/Progress Updates:    Patient in supine and c/o pain at HD cath insertion site.  She initially refused to participate in therapy today as was feeling poorly, but with encouragement, she agreed to bed level exercises.  Patient in supine performed ankle pumps x 20, passive stretch to heel cords 2 x 30 sec each foot, hip adductor squeezes w/ 5 sec hold x 10, heel slides x 10, hip abduction x 10, SAQ w/ 5 sec hold x 10.  Applied gripper socks and pt performed bridging with 5 sec hold x 10, hooklying marching x 10 and hooklying hip abduction with orange t-band x 10.  Patient reports felt as if brief was soiled, checked and noted some soiling so agreed to assist with changing brief.  Patient rolled to L with S using rail.  Removed soiled brief and performed hygiene with total A.  Noted sacral pad soiled so removed as well and noted some skin breakdown over bony prominence on pt's sacrum.  RN informed and in to measure/assess area and she applied new sacral pad.  Fastened clean brief and replaced new bed pad with pt rolling also to R with S.  Patient scooted up in bed pulling with L UE and pushing with feet bed in trendelenberg with min A.  Patient positioned with quarter turn to L for pressure relief.  HOB elevated per pt preference and left with call bell and needs in reach and bed  alarm active.   Therapy Documentation Precautions:  Precautions Precautions: Fall Precaution Comments: seizure precautions Restrictions Weight Bearing Restrictions: No Other Position/Activity Restrictions: head incision Pain: Pain Assessment Pain Scale: 0-10 Pain Score: 0-No pain Faces Pain Scale: Hurts even more Pain Type: Acute pain Pain Location: Chest Pain Orientation: Right Pain Descriptors / Indicators: Tender;Sore Pain Onset: On-going Pain Intervention(s): Distraction   Therapy/Group: Individual Therapy  Reginia Naas  Deal, PT 09/05/2020, 12:07 PM

## 2020-09-05 NOTE — Progress Notes (Signed)
Renal Navigator spoke with Renal PA/K. Stoval to request that tomorrow's HD treatment be ordered in a recliner. Navigator needs documentation that patient can tolerate HD in a chair for outpatient HD acceptance, especially since she requires transfer by hoyer lift. Navigator also informed CIR CSW/A. Barbra Sarks, covering for B. Dupree today. Navigator updated HD unit of patient's need for HD in recliner.   Alphonzo Cruise, [place] Renal Navigator [phone]

## 2020-09-06 DIAGNOSIS — S065X9A Traumatic subdural hemorrhage with loss of consciousness of unspecified duration, initial encounter: Secondary | ICD-10-CM | POA: Diagnosis not present

## 2020-09-06 LAB — CBC
HCT: 34.1 % — ABNORMAL LOW (ref 36.0–46.0)
Hemoglobin: 11.1 g/dL — ABNORMAL LOW (ref 12.0–15.0)
MCH: 33.1 pg (ref 26.0–34.0)
MCHC: 32.6 g/dL (ref 30.0–36.0)
MCV: 101.8 fL — ABNORMAL HIGH (ref 80.0–100.0)
Platelets: 239 10*3/uL (ref 150–400)
RBC: 3.35 MIL/uL — ABNORMAL LOW (ref 3.87–5.11)
RDW: 16.7 % — ABNORMAL HIGH (ref 11.5–15.5)
WBC: 8.3 10*3/uL (ref 4.0–10.5)
nRBC: 0 % (ref 0.0–0.2)

## 2020-09-06 LAB — RENAL FUNCTION PANEL
Albumin: 1.2 g/dL — ABNORMAL LOW (ref 3.5–5.0)
Anion gap: 7 (ref 5–15)
BUN: 14 mg/dL (ref 8–23)
CO2: 24 mmol/L (ref 22–32)
Calcium: 7.9 mg/dL — ABNORMAL LOW (ref 8.9–10.3)
Chloride: 92 mmol/L — ABNORMAL LOW (ref 98–111)
Creatinine, Ser: 2.6 mg/dL — ABNORMAL HIGH (ref 0.44–1.00)
GFR, Estimated: 19 mL/min — ABNORMAL LOW (ref >60)
Glucose, Bld: 93 mg/dL (ref 70–99)
Phosphorus: 3.8 mg/dL (ref 2.5–4.6)
Potassium: 4 mmol/L (ref 3.5–5.1)
Sodium: 123 mmol/L — ABNORMAL LOW (ref 135–145)

## 2020-09-06 MED ORDER — ALBUMIN HUMAN 25 % IV SOLN
INTRAVENOUS | Status: AC
  Administered 2020-09-06: 25 g via INTRAVENOUS
  Filled 2020-09-06: qty 100

## 2020-09-06 MED ORDER — ALBUMIN HUMAN 25 % IV SOLN: 25.0000 g | Freq: Once | INTRAVENOUS | Status: AC

## 2020-09-06 MED ORDER — HEPARIN SODIUM (PORCINE) 1000 UNIT/ML IJ SOLN
INTRAMUSCULAR | Status: AC
  Filled 2020-09-06: qty 3

## 2020-09-06 NOTE — Progress Notes (Signed)
[facility] PHYSICAL MEDICINE & REHABILITATION PROGRESS NOTE  Subjective/Complaints:   Pt reports no issues- denies pain- heading to HD soon.  LBM yesterday.   ROS:  Pt denies SOB, abd pain, CP, N/V/C/D, and vision changes   Objective: Vital Signs: Blood pressure 123/70, pulse 98, temperature 98 F (36.7 C), temperature source Oral, resp. rate 16, height 5\' 4"  (1.626 m), weight 45.9 kg, SpO2 98 %. No results found. Recent Labs    09/04/20 1812 09/06/20 1222  WBC 12.2* 8.3  HGB 11.8* 11.1*  HCT 34.4* 34.1*  PLT 216 239   Recent Labs    09/04/20 0601 09/06/20 1223  NA 124* 123*  K 4.7 4.0  CL 92* 92*  CO2 24 24  GLUCOSE 86 93  BUN 27* 14  CREATININE 3.82* 2.60*  CALCIUM 8.1* 7.9*    Intake/Output Summary (Last 24 hours) at 09/06/2020 1547 Last data filed at 09/06/2020 1430 Gross per 24 hour  Intake 397 ml  Output 738 ml  Net -341 ml        Physical Exam: BP 123/70 (BP Location: Right Arm)   Pulse 98   Temp 98 F (36.7 C) (Oral)   Resp 16   Ht 5\' 4"  (1.626 m)   Wt 45.9 kg   SpO2 98%   BMI 17.37 kg/m      General: awake, alert, appropriate, sitting up in bed; PD machine at bedside;  NAD HENT: conjugate gaze; oropharynx moist CV: borderline tachycardia, but overall, regular rate; no JVD Pulmonary: CTA B/L; no W/R/R- good air movement GI: soft, NT, ND, (+)BS (+)PD catheter Psychiatric: appropriate Neurological: alert  Skin: Warm and dry.  Intact.. Musc: No edema in extremities.  No tenderness in extremities. Motor: LUE: 4/5 proximal distal, unchanged RUE: 4/5 proximal distal LLE: Hip flexion 4/5, knee extension 4/5, ankle dorsiflexion 4-/5, unchanged RLE: Hip flexion 4/5, knee extension 4/5, ankle dorsiflexion 3-3+/5  Assessment/Plan: 1. Functional deficits which require 3+ hours per day of interdisciplinary therapy in a comprehensive inpatient rehab setting.  Physiatrist is providing close team supervision and 24 hour management of active  medical problems listed below.  Physiatrist and rehab team continue to assess barriers to discharge/monitor patient progress toward functional and medical goals   Care Tool:  Bathing    Body parts bathed by patient: Right arm,Left arm,Chest,Abdomen,Front perineal area,Face,Right upper leg,Left upper leg,Buttocks,Right lower leg,Left lower leg   Body parts bathed by helper: Right lower leg,Left lower leg,Buttocks     Bathing assist Assist Level: Moderate Assistance - Patient 50 - 74%     Upper Body Dressing/Undressing Upper body dressing   What is the patient wearing?: Pull over shirt    Upper body assist Assist Level: Moderate Assistance - Patient 50 - 74% (due to fatigue)    Lower Body Dressing/Undressing Lower body dressing      What is the patient wearing?: Pants     Lower body assist Assist for lower body dressing: Maximal Assistance - Patient 25 - 49%     Toileting Toileting    Toileting assist Assist for toileting: Maximal Assistance - Patient 25 - 49%     Transfers Chair/bed transfer  Transfers assist     Chair/bed transfer assist level: Moderate Assistance - Patient 50 - 74% Chair/bed transfer assistive device: Walker,Armrests   Locomotion Ambulation   Ambulation assist   Ambulation activity did not occur: Safety/medical concerns  Assist level: Moderate Assistance - Patient 50 - 74% Assistive device: Walker-rolling Max distance: 10  Walk 10 feet activity   Assist  Walk 10 feet activity did not occur: Safety/medical concerns  Assist level: Moderate Assistance - Patient - 50 - 74% Assistive device: Walker-rolling   Walk 50 feet activity   Assist Walk 50 feet with 2 turns activity did not occur: Safety/medical concerns         Walk 150 feet activity   Assist Walk 150 feet activity did not occur: Safety/medical concerns         Walk 10 feet on uneven surface  activity   Assist Walk 10 feet on uneven surfaces activity did  not occur: Safety/medical concerns   Assist level: Moderate Assistance - Patient - 50 - 74% Assistive device: Aeronautical engineer Will patient use wheelchair at discharge?: Yes Type of Wheelchair: Manual    Wheelchair assist level: Supervision/Verbal cueing Max wheelchair distance: 40'    Wheelchair 50 feet with 2 turns activity    Assist    Wheelchair 50 feet with 2 turns activity did not occur: Safety/medical concerns   Assist Level: Supervision/Verbal cueing   Wheelchair 150 feet activity     Assist  Wheelchair 150 feet activity did not occur: Safety/medical concerns        Medical Problem List and Plan: 1.  Right hemiparesis and aphasia secondary to large left SDH s/p craniotomy  con't PT, OT and SLP 2.  Impaired mobility -DVT/anticoagulation:  Mechanical: Continue Sequential compression devices, below knee Bilateral lower extremities             -antiplatelet therapy: N/A due to bleed.  3. Chronic migraines/ Pain Management: Continue Fioricet 3-4 times a day on average. Added prn Fioricet q8H  Pain overall controlled per pt- con't regimen  Monitor with increased exertion 4. Mood: LCSW to follow for evaluation and support.  -antipsychotic agents: N/A 5. Neuropsych: This patient is not fully capable of making decisions on her own behalf. 6. Skin/Wound Care: Monitor wound for healing.             -Family to provide meals from home as able.  -bilateral PRAFOs ordered but patient refuses to wear 7. Fluids/Electrolytes/Nutrition: Continue PD--does not need fluid restriction.              --has had chronic issues with variable intake as well as N/V likely due to gastroparesis.  8. Chronic Hypotension: Continue TEDs when out of bed             Vitals:   09/06/20 1400 09/06/20 1430  BP: (!) 122/58 123/70  Pulse: 96 98  Resp:  16  Temp:  98 F (36.7 C)  SpO2:  98%   Controlled on 5/6  5/7- per Nephrology, pt's hypotension got in way  of getting fluid off- gave labumin 9.  ESRD   Transitioning from PD to HD.  HD cath placed on 5/5 10.  Recurrent Seizures: Has been seizure free, continue Brivaracetam and Lacosamide bid.              --Lacosamide was decreased on 03/31 to avoid sedative SE, decreased again on 4/28- appreciate neurology recommendations..  No breakthrough seizures since admission- 5/6 11. CAD/NSTEMI s/p PCI: Treated medically.   Off DAPT due to bleed   Continue Lipitor.   Metoprolol low-dose resumed  Heart rate controlled on 5/6  5/7- HR 98 this AM- overall better- con't regimen 12. Acute on chronic anemia:   Hemoglobin 11.8 on 5/5  Continue to monitor 13. H/o  Depression: Continue Celexa 14. Sick Euthyroid: Will need follow up as illness resolves.              --TSH- 5.80/Free T4-1.2 (03/27) 15. Gastroparesis w/chronic N/V/anorexia: Has been ongoing per records review.              --question failure of PD (husband reports no N/V with brief episode of HD in the past)  Continue Reglan 5 3 times daily  Hesitant to continue increase medications due to #18  Improved overall 16.  Hyponatremia             Chronic hyponatremia with Na-130-133 range  Recs per nephro             --Continue Sodium bicarb to manage metabolic acidosis. 17.  Severe hypoalbuminemia  Supplement d/ced given nausea and vomiting- recommended nuts as source of protein. Her daughter has brought in.   5/7- nephrology giving albumin today.  18.  Prolonged QTC  ECG reviewed, improved since last visit  Compazine and dose/frequency decreased 19.  Sleep disturbance- "never a good sleeper"  Cont trazodone and melatonin    At baseline 20.  Hypokalemia  Potassium 4.7 on 5/5  Scheduled supplement DC'd.  21. Leukocytosis  WBCs 12.2 on 5/5  Afebrile  No signs/symptoms of infection  Cont to monitor  LOS: 33 days A FACE TO FACE EVALUATION WAS PERFORMED  Aaryanna Hyden 09/06/2020, 3:47 PM

## 2020-09-06 NOTE — Progress Notes (Signed)
Amy [name] Progress Note   Subjective:  Seen in room - eating a small yogurt cup. Denies CP or dyspnea overnight.  Objective Vitals:   09/05/20 1952 09/06/20 0500 09/06/20 0514 09/06/20 0903  BP: 122/69  138/75 129/78  Pulse: 83  77 92  Resp: 18  18   Temp: 98.3 F (36.8 C)     TempSrc:      SpO2: 98%  99%   Weight:  45.9 kg    Height:       Physical Exam General: Well appearing woman, NAD Heart: RRR; no murmur Lungs: CTA anteriorly Abdomen: soft, non-tender, PD cath in place Extremities: No LE edema Dialysis Access: R IJ TDC, non-tender  Additional Objective Labs: Basic Metabolic Panel: Recent Labs  Lab 09/01/20 0548 09/04/20 0601  NA 126* 124*  K 3.4* 4.7  CL 91* 92*  CO2 24 24  GLUCOSE 84 86  BUN 18 27*  CREATININE 3.12* 3.82*  CALCIUM 8.2* 8.1*  PHOS  --  5.4*   Liver Function Tests: Recent Labs  Lab 09/04/20 0601  ALBUMIN 1.1*   CBC: Recent Labs  Lab 09/01/20 0548 09/04/20 1812  WBC 9.4 12.2*  HGB 11.3* 11.8*  HCT 33.8* 34.4*  MCV 98.5 96.1  PLT 242 216   Studies/Results: IR Fluoro Guide CV Line Right  Result Date: 09/04/2020 INDICATION: 70 year old female with end-stage renal disease requiring central venous access for hemodialysis. EXAM: TUNNELED CENTRAL VENOUS HEMODIALYSIS CATHETER PLACEMENT WITH ULTRASOUND AND FLUOROSCOPIC GUIDANCE MEDICATIONS: Vancomycin 1 gm IV . The antibiotic was given in an appropriate time interval prior to skin puncture. ANESTHESIA/SEDATION: Moderate (conscious) sedation was employed during this procedure. A total of Versed 1 mg and Fentanyl 25 mcg was administered intravenously. Moderate Sedation Time: 13 minutes. The patient's level of consciousness and vital signs were monitored continuously by radiology nursing throughout the procedure under my direct supervision. FLUOROSCOPY TIME:  0 minutes 6 seconds (1 mGy). COMPLICATIONS: None immediate. PROCEDURE: Informed written consent was obtained from the  patient after a discussion of the risks, benefits, and alternatives to treatment. Questions regarding the procedure were encouraged and answered. The right neck and chest were prepped with chlorhexidine in a sterile fashion, and a sterile drape was applied covering the operative field. Maximum barrier sterile technique with sterile gowns and gloves were used for the procedure. A timeout was performed prior to the initiation of the procedure. After creating a small venotomy incision, a 21 gauge micropuncture kit was utilized to access the internal jugular vein. Real-time ultrasound guidance was utilized for vascular access including the acquisition of a permanent ultrasound image documenting patency of the accessed vessel. A Rosen wire was advanced to the level of the IVC and the micropuncture sheath was exchanged for an 8 Fr dilator. A 14.5 French tunneled hemodialysis catheter measuring 19 cm from tip to cuff was tunneled in a retrograde fashion from the anterior chest wall to the venotomy incision. Serial dilation was then performed an a peel-away sheath was placed. The catheter was then placed through the peel-away sheath with the catheter tip ultimately positioned within the right atrium. Final catheter positioning was confirmed and documented with a spot radiographic image. The catheter aspirates and flushes normally. The catheter was flushed with appropriate volume heparin dwells. The catheter exit site was secured with a 0-Silk retention suture. The venotomy incision was closed with Dermabond. Sterile dressings were applied. The patient tolerated the procedure well without immediate post procedural complication. IMPRESSION: Successful placement of 19 cm  tip to cuff tunneled hemodialysis catheter via the right internal jugular vein with catheter tip terminating within the right atrium. The catheter is ready for immediate use. Ruthann Cancer, MD [facility]  Radiology Electronically Signed   By: Ruthann Cancer MD   On: 09/04/2020 11:10   IR US Guide Vasc Access Right  Result Date: 09/04/2020 INDICATION: 70 year old female with end-stage renal disease requiring central venous access for hemodialysis. EXAM: TUNNELED CENTRAL VENOUS HEMODIALYSIS CATHETER PLACEMENT WITH ULTRASOUND AND FLUOROSCOPIC GUIDANCE MEDICATIONS: Vancomycin 1 gm IV . The antibiotic was given in an appropriate time interval prior to skin puncture. ANESTHESIA/SEDATION: Moderate (conscious) sedation was employed during this procedure. A total of Versed 1 mg and Fentanyl 25 mcg was administered intravenously. Moderate Sedation Time: 13 minutes. The patient's level of consciousness and vital signs were monitored continuously by radiology nursing throughout the procedure under my direct supervision. FLUOROSCOPY TIME:  0 minutes 6 seconds (1 mGy). COMPLICATIONS: None immediate. PROCEDURE: Informed written consent was obtained from the patient after a discussion of the risks, benefits, and alternatives to treatment. Questions regarding the procedure were encouraged and answered. The right neck and chest were prepped with chlorhexidine in a sterile fashion, and a sterile drape was applied covering the operative field. Maximum barrier sterile technique with sterile gowns and gloves were used for the procedure. A timeout was performed prior to the initiation of the procedure. After creating a small venotomy incision, a 21 gauge micropuncture kit was utilized to access the internal jugular vein. Real-time ultrasound guidance was utilized for vascular access including the acquisition of a permanent ultrasound image documenting patency of the accessed vessel. A Rosen wire was advanced to the level of the IVC and the micropuncture sheath was exchanged for an 8 Fr dilator. A 14.5 French tunneled hemodialysis catheter measuring 19 cm from tip to cuff was tunneled in a retrograde fashion from the anterior chest wall  to the venotomy incision. Serial dilation was then performed an a peel-away sheath was placed. The catheter was then placed through the peel-away sheath with the catheter tip ultimately positioned within the right atrium. Final catheter positioning was confirmed and documented with a spot radiographic image. The catheter aspirates and flushes normally. The catheter was flushed with appropriate volume heparin dwells. The catheter exit site was secured with a 0-Silk retention suture. The venotomy incision was closed with Dermabond. Sterile dressings were applied. The patient tolerated the procedure well without immediate post procedural complication. IMPRESSION: Successful placement of 19 cm tip to cuff tunneled hemodialysis catheter via the right internal jugular vein with catheter tip terminating within the right atrium. The catheter is ready for immediate use. Ruthann Cancer, MD [facility] Radiology Electronically Signed   By: Ruthann Cancer MD   On: 09/04/2020 11:10   Medications: . dialysis solution 1.5% low-MG/low-CA     . aspirin EC  81 mg Oral Daily  . atorvastatin  40 mg Oral Daily  . brivaracetam  75 mg Oral BID  . Chlorhexidine Gluconate Cloth  6 each Topical Daily  . citalopram  10 mg Oral Daily  . [START ON 09/09/2020] darbepoetin (ARANESP) injection - DIALYSIS  60 mcg Intravenous Q Tue-HD  . docusate sodium  100 mg Oral BID  . gentamicin cream  1 application Topical Daily  . heparin  5,000 Units Subcutaneous Q8H  . kidney failure book   Does not apply Once  . lacosamide  50 mg Oral BID  .  melatonin  5 mg Oral QHS  . metoCLOPramide  5 mg Oral TID AC  . metoprolol tartrate  6.25 mg Oral BID  . multivitamin  1 tablet Oral QHS  . pantoprazole  40 mg Oral Daily  . triamcinolone cream   Topical TID    Dialysis Orders: [place] followed by Dr. Candiss Norse ([place]). 4 exchanges overnight, 2000 cc and 1.5 hrs each ->  transitioned to HD. Renal care manager arranging OP HD plans and PD cath flushes  Assessment/Plan:  1.[facility] [facility] [place], [place]/b Dr Candiss Norse of [place].Bloody effluent seen on takedown 4/15and again 4/28 and 4/29. Not receiving heparin with PD.PD gram stain, cultures and cell count negative for infection.D/t plan for SNF, she was transitioned to HD. S/p TDC placement 5/5 with HD afterwards -> next HD today, then plan to change to MWF next week. 2. Hypokalemia:chronic while on PD - should improve on HD, follow. 3. HTN/volume: Significant overload on admit, now euvolemic. EDW will be around 41kg. 4.Fall w/ subdural hematoma: S/p SDH [facility] at [facility] on3/24/22 5. Anemia of ESRD: Hgb up to 11.8, getting ESA q Tuesday - will plan to hold next dose if Hgb > 11. 6.MBD: CorrCa mid 10's, Phos ok. No binder or VDRA, follow. 7.Nausea/ vomiting: Intermittent, using Reglan TID. 8. Malnutrition/ debility: Alb very low. Previously had refused changing to HD, now agreeable since will be going to SNF. Has not tolerated protein supplements. 9.Multivessel CAD/HFrEF (25-30% LHC 06/2020): Per TCTSwasnotCABG candidate. UnderwentPCI to LAD. At [facility] repeat echo per family showed improved EF 55%. 10. Chronichyponatremia: Stable and euvolemic appearing. Off Na tabs. 11. Metabolic acidosis: Stable, off bicarb. 12.HxSeizures-per primary team- vimpat was reduced 4/29 for side effects.  26 Debility: 2/2 Multiple med issues, most recently SDH recovery and NSTEMI w/ CAD and low EF. Readmitted to CIR on 08/04/20.Now planning discharge to SNF with transition to HD.  Veneta Penton, PA-C 09/06/2020, 9:42 AM  [employer]

## 2020-09-06 NOTE — Progress Notes (Signed)
Occupational Therapy Session Note  Patient Details  Name: Amy [name] MRN: [id number] Date of Birth: 1950/08/12  Today's Date: 09/06/2020 OT Missed Time: 16 Minutes Missed Time Reason: Other (comment) (Pt off unit at HD)   Short Term Goals: Week 5:  OT Short Term Goal 1 (Week 5): Patient will complete SPT with consistent min A OT Short Term Goal 2 (Week 5): Patient will complete toileting with mod A OT Short Term Goal 3 (Week 5): patient will complete lower body dressing with min A  Skilled Therapeutic Interventions/Progress Updates:    Attempted to see pt for scheduled OT session. Pt off unit for HD. Missed 45 min of OT.    Volanda Napoleon MS, OTR/L  09/06/2020, 6:47 AM

## 2020-09-06 NOTE — Plan of Care (Signed)
  Problem: Consults Goal: RH BRAIN INJURY PATIENT EDUCATION Description: Description: See Patient Education module for eduction specifics Outcome: Progressing   Problem: RH SKIN INTEGRITY Goal: RH STG SKIN FREE OF INFECTION/BREAKDOWN Description: With min assist  Outcome: Progressing Goal: RH STG MAINTAIN SKIN INTEGRITY WITH ASSISTANCE Description: STG Maintain Skin Integrity With min  Assistance. Outcome: Progressing Goal: RH STG ABLE TO PERFORM INCISION/WOUND CARE W/ASSISTANCE Description: STG Able To Perform Incision/Wound Care With min Assistance. Outcome: Progressing   Problem: RH SAFETY Goal: RH STG ADHERE TO SAFETY PRECAUTIONS W/ASSISTANCE/DEVICE Description: STG Adhere to Safety Precautions With cues/reminders Assistance/Device. Outcome: Progressing   Problem: RH COGNITION-NURSING Goal: RH STG USES MEMORY AIDS/STRATEGIES W/ASSIST TO PROBLEM SOLVE Description: STG Uses Memory Aids/Strategies With cues/reminders Assistance to Problem Solve. Outcome: Progressing Goal: RH STG ANTICIPATES NEEDS/CALLS FOR ASSIST W/ASSIST/CUES Description: STG Anticipates Needs/Calls for Assist With reminders/Cues. Outcome: Progressing   Problem: RH PAIN MANAGEMENT Goal: RH STG PAIN MANAGED AT OR BELOW PT'S PAIN GOAL Description: At or below level 4 Outcome: Progressing   Problem: RH KNOWLEDGE DEFICIT BRAIN INJURY Goal: RH STG INCREASE KNOWLEDGE OF SELF CARE AFTER BRAIN INJURY Description: Patient and spouse will be able to manage care at discharge using handouts and educational materials independently Outcome: Progressing   Problem: Education: Goal: Individualized Educational Video(s) Description: With cues/reminders and zone tool for HF monitoring Outcome: Progressing   Problem: Activity: Goal: Capacity to carry out activities will improve Description: With supervision Outcome: Progressing   Problem: Cardiac: Goal: Ability to achieve and maintain adequate cardiopulmonary  perfusion will improve Description: With exercise and management of HF, CKD , taking meds as prescribed and dietary modifications Outcome: Progressing   Problem: RH Vision Goal: RH LTG Vision (Specify) Outcome: Progressing

## 2020-09-06 NOTE — Progress Notes (Signed)
BP low with HD -- unable to pull fluid off. Will give IV albumin and follow.  Veneta Penton, PA-C [employer] Pager [phone]

## 2020-09-07 NOTE — Plan of Care (Signed)
  Problem: Consults Goal: RH BRAIN INJURY PATIENT EDUCATION Description: Description: See Patient Education module for eduction specifics Outcome: Progressing   Problem: RH SKIN INTEGRITY Goal: RH STG SKIN FREE OF INFECTION/BREAKDOWN Description: With min assist  Outcome: Progressing Goal: RH STG MAINTAIN SKIN INTEGRITY WITH ASSISTANCE Description: STG Maintain Skin Integrity With min  Assistance. Outcome: Progressing Goal: RH STG ABLE TO PERFORM INCISION/WOUND CARE W/ASSISTANCE Description: STG Able To Perform Incision/Wound Care With min Assistance. Outcome: Progressing   Problem: RH SAFETY Goal: RH STG ADHERE TO SAFETY PRECAUTIONS W/ASSISTANCE/DEVICE Description: STG Adhere to Safety Precautions With cues/reminders Assistance/Device. Outcome: Progressing   Problem: RH COGNITION-NURSING Goal: RH STG USES MEMORY AIDS/STRATEGIES W/ASSIST TO PROBLEM SOLVE Description: STG Uses Memory Aids/Strategies With cues/reminders Assistance to Problem Solve. Outcome: Progressing Goal: RH STG ANTICIPATES NEEDS/CALLS FOR ASSIST W/ASSIST/CUES Description: STG Anticipates Needs/Calls for Assist With reminders/Cues. Outcome: Progressing   Problem: RH PAIN MANAGEMENT Goal: RH STG PAIN MANAGED AT OR BELOW PT'S PAIN GOAL Description: At or below level 4 Outcome: Progressing   Problem: RH KNOWLEDGE DEFICIT BRAIN INJURY Goal: RH STG INCREASE KNOWLEDGE OF SELF CARE AFTER BRAIN INJURY Description: Patient and spouse will be able to manage care at discharge using handouts and educational materials independently Outcome: Progressing   Problem: Education: Goal: Individualized Educational Video(s) Description: With cues/reminders and zone tool for HF monitoring Outcome: Progressing   Problem: Activity: Goal: Capacity to carry out activities will improve Description: With supervision Outcome: Progressing   Problem: Cardiac: Goal: Ability to achieve and maintain adequate cardiopulmonary  perfusion will improve Description: With exercise and management of HF, CKD , taking meds as prescribed and dietary modifications Outcome: Progressing   Problem: RH Vision Goal: RH LTG Vision (Specify) Outcome: Progressing

## 2020-09-07 NOTE — Progress Notes (Signed)
[facility] Progress Note   Subjective:  Seen in room - no issues today, no CP or dyspnea. Did great with HD in recliner yesterday - no issues tolerating. Her BP did drop at start of HD, but she was given 25g IV albumin and BP improved - ended up getting 737mL net UF. Reminded on the importance of getting protein in her diet - she doesn't tolerate any of the supplements here and dislikes hospital food in general.  Objective Vitals:   09/06/20 1615 09/06/20 1938 09/07/20 0507 09/07/20 0839  BP: 114/80 120/72 118/67 118/69  Pulse: 100 86 72 87  Resp: 16 18 16    Temp: 98.9 F (37.2 C) 98.5 F (36.9 C) 98.7 F (37.1 C)   TempSrc: Oral  Oral   SpO2: 99% 98% 98%   Weight:      Height:       Physical Exam General:Well appearing, frail woman, NAD Heart:RRR; no murmur Lungs:CTA anteriorly Abdomen:soft, non-tender, PD cath in place Extremities:No LE edema Dialysis Access:R IJ TDC, non-tender  Additional Objective Labs: Basic Metabolic Panel: Recent Labs  Lab 09/01/20 0548 09/04/20 0601 09/06/20 1223  NA 126* 124* 123*  K 3.4* 4.7 4.0  CL 91* 92* 92*  CO2 24 24 24   GLUCOSE 84 86 93  BUN 18 27* 14  CREATININE 3.12* 3.82* 2.60*  CALCIUM 8.2* 8.1* 7.9*  PHOS  --  5.4* 3.8   Liver Function Tests: Recent Labs  Lab 09/04/20 0601 09/06/20 1223  ALBUMIN 1.1* 1.2*   CBC: Recent Labs  Lab 09/01/20 0548 09/04/20 1812 09/06/20 1222  WBC 9.4 12.2* 8.3  HGB 11.3* 11.8* 11.1*  HCT 33.8* 34.4* 34.1*  MCV 98.5 96.1 101.8*  PLT 242 216 239   Medications: . dialysis solution 1.5% low-MG/low-CA     . aspirin EC  81 mg Oral Daily  . atorvastatin  40 mg Oral Daily  . brivaracetam  75 mg Oral BID  . Chlorhexidine Gluconate Cloth  6 each Topical Daily  . citalopram  10 mg Oral Daily  . [START ON 09/09/2020] darbepoetin (ARANESP) injection - DIALYSIS  60 mcg Intravenous Q Tue-HD  . docusate sodium  100 mg Oral BID  . gentamicin cream  1 application Topical  Daily  . heparin  5,000 Units Subcutaneous Q8H  . kidney failure book   Does not apply Once  . lacosamide  50 mg Oral BID  . melatonin  5 mg Oral QHS  . metoCLOPramide  5 mg Oral TID AC  . metoprolol tartrate  6.25 mg Oral BID  . multivitamin  1 tablet Oral QHS  . pantoprazole  40 mg Oral Daily  . triamcinolone cream   Topical TID    Dialysis Orders: [place] followed by Dr. Candiss Norse ([place]). 4 exchanges overnight, 2000 cc and 1.5 hrs each -> transitioned to HD. Renal care manager arranging OP HD plans and PD cath flushes  Assessment/Plan:  1.ESRD: Previously on [facility] [place], f/b Dr Candiss Norse of [place].Bloody effluent seen on takedown 4/15and again 4/28 and 4/29. Not receiving heparin with PD.PD gram stain, cultures and cell count negative for infection.D/t plan for SNF, she was transitioned to HD. S/p [facility] placement 5/5 with HD afterwards ->changing to MWF  to reflect new outpatient schedule - next HD tomorrow, 5/9. 2. Hypokalemia:chronic while on PD - should improve on HD, follow. 3.HTN/volume: Significant overload on admit, now euvolemic. EDW will be around 43kg. 4.Fall w/ subdural hematoma: S/[facility]SDH evacuation at [facility] on3/24/22  5. Anemia of ESRD: Hgb up to 11.8, getting ESA q Tuesday - will plan to hold next dose if Hgb > 11. 6.MBD: CorrCa mid 10's, Phos ok. No binder or VDRA, follow. 7.Nausea/ vomiting: Intermittent, using Reglan TID. 8. Malnutrition/ debility: Alb very low. Previously had refused changing to HD, now agreeable since will be going to SNF.Has not tolerated protein supplements. 9.Multivessel CAD/HFrEF (25-30% LHC 06/2020):Per TCTSwasnotCABG candidate. UnderwentPCI to LAD. At [place] repeat echo per family showed improved EF 55%. 10. Chronichyponatremia: Stable and euvolemic appearing. Off Na tabs. 11. Metabolic acidosis: Stable, off bicarb. 12.HxSeizures-per primary team- vimpat was reduced 4/29 for side effects.  61  Debility: 2/2 Multiple med issues, most recently SDH recovery and NSTEMI w/ CAD and low EF. Readmitted to CIR on 08/04/20.Now planning discharge to SNFwithtransition to HD.  Veneta Penton, PA-C 09/07/2020, 9:08 AM  [employer]

## 2020-09-07 NOTE — Progress Notes (Signed)
Occupational Therapy Session Note  Patient Details  Name: ENID MAULTSBY MRN: [id number] Date of Birth: [birth year]  Today's Date: 09/07/2020 OT Individual Time: 1000-1055 OT Individual Time Calculation (min): 55 min    Short Term Goals: Week 5:  OT Short Term Goal 1 (Week 5): Patient will complete SPT with consistent min A OT Short Term Goal 2 (Week 5): Patient will complete toileting with mod A OT Short Term Goal 3 (Week 5): patient will complete lower body dressing with min A  Skilled Therapeutic Interventions/Progress Updates:    patient in bed, alert and states that she is tired and has a mild HA.  With encouragement she completes sponge bath bed level with min A for buttocks and cues/set up for the rest of bathing task.  OH shirt min A, max A to change incontinence brief.  She declined to donn clean pants at this time.  Supine to sitting edge of bed with min A.  She tolerated unsupported sitting with CS for oral care with set up and seated activity.  Sit to stand from bed surface x 2 with max A and tolerance of less than one minute each attempt.  Side stepping toward HOB with mod a.  Sit to supine with mod A.  She completed bed level trunk, upper body and lower body AROM activities at slow rate.  She remained in bed at close of session.  Bed alarm set and call bell in hand.    Therapy Documentation Precautions:  Precautions Precautions: Fall Precaution Comments: seizure precautions Restrictions Weight Bearing Restrictions: No Other Position/Activity Restrictions: head incision   Therapy/Group: Individual Therapy  Carlos Levering 09/07/2020, 7:33 AM

## 2020-09-07 NOTE — Progress Notes (Signed)
Speech Language Pathology Daily Session Note  Patient Details  Name: Amy [name] MRN: [id number] Date of Birth: March 13, 1951  Today's Date: 09/07/2020 SLP Individual Time: 1120-1200 SLP Individual Time Calculation (min): 40 min  Short Term Goals: Week 5: SLP Short Term Goal 1 (Week 5): STG=LTG due to awaiting SNF placement  Skilled Therapeutic Interventions:  Pt was seen for skilled ST targeting cognitive goals.  Pt was napping upon therapist's arrival but awakened easily to voice.  Pt remained lethargic throughout therapy session and reported ongoing poor sleep.  Pt was noted with increased response latency and increased word finding difficulty at the beginning of today's therapy session which improved as session progressed.  Pt required min assist to recall vague details of previous therapy sessions or goals of treatment and she repeated parts of stories without awareness.  Pt was left in bed with bed alarm set and call bell within reach.  Continue per current plan of care.    Pain Pain Assessment Pain Scale: 0-10 Pain Score: 0-No pain  Therapy/Group: Individual Therapy  Amy [name], Selinda Orion 09/07/2020, 12:41 PM

## 2020-09-08 DIAGNOSIS — G479 Sleep disorder, unspecified: Secondary | ICD-10-CM | POA: Diagnosis not present

## 2020-09-08 DIAGNOSIS — D649 Anemia, unspecified: Secondary | ICD-10-CM | POA: Diagnosis not present

## 2020-09-08 DIAGNOSIS — S065X9A Traumatic subdural hemorrhage with loss of consciousness of unspecified duration, initial encounter: Secondary | ICD-10-CM | POA: Diagnosis not present

## 2020-09-08 DIAGNOSIS — G8918 Other acute postprocedural pain: Secondary | ICD-10-CM | POA: Diagnosis not present

## 2020-09-08 LAB — CBC
HCT: 30.4 % — ABNORMAL LOW (ref 36.0–46.0)
Hemoglobin: 9.9 g/dL — ABNORMAL LOW (ref 12.0–15.0)
MCH: 32.5 pg (ref 26.0–34.0)
MCHC: 32.6 g/dL (ref 30.0–36.0)
MCV: 99.7 fL (ref 80.0–100.0)
Platelets: 190 10*3/uL (ref 150–400)
RBC: 3.05 MIL/uL — ABNORMAL LOW (ref 3.87–5.11)
RDW: 16.1 % — ABNORMAL HIGH (ref 11.5–15.5)
WBC: 4.3 10*3/uL (ref 4.0–10.5)
nRBC: 0 % (ref 0.0–0.2)

## 2020-09-08 LAB — RENAL FUNCTION PANEL
Albumin: 1.7 g/dL — ABNORMAL LOW (ref 3.5–5.0)
Anion gap: 9 (ref 5–15)
BUN: 12 mg/dL (ref 8–23)
CO2: 25 mmol/L (ref 22–32)
Calcium: 8.2 mg/dL — ABNORMAL LOW (ref 8.9–10.3)
Chloride: 91 mmol/L — ABNORMAL LOW (ref 98–111)
Creatinine, Ser: 2.24 mg/dL — ABNORMAL HIGH (ref 0.44–1.00)
GFR, Estimated: 23 mL/min — ABNORMAL LOW (ref >60)
Glucose, Bld: 85 mg/dL (ref 70–99)
Phosphorus: 2.9 mg/dL (ref 2.5–4.6)
Potassium: 3.9 mmol/L (ref 3.5–5.1)
Sodium: 125 mmol/L — ABNORMAL LOW (ref 135–145)

## 2020-09-08 MED ORDER — HEPARIN SODIUM (PORCINE) 1000 UNIT/ML IJ SOLN
INTRAMUSCULAR | Status: AC
  Filled 2020-09-08: qty 4

## 2020-09-08 MED ORDER — DARBEPOETIN ALFA 40 MCG/0.4ML IJ SOSY
PREFILLED_SYRINGE | INTRAMUSCULAR | Status: AC
  Filled 2020-09-08: qty 0.4

## 2020-09-08 MED ORDER — DOXERCALCIFEROL 4 MCG/2ML IV SOLN
INTRAVENOUS | Status: AC
  Filled 2020-09-08: qty 4

## 2020-09-08 MED ORDER — ALBUMIN HUMAN 25 % IV SOLN
INTRAVENOUS | Status: AC
  Filled 2020-09-08: qty 100

## 2020-09-08 MED ORDER — ALBUMIN HUMAN 25 % IV SOLN: 25.0000 g | Freq: Once | INTRAVENOUS | Status: DC

## 2020-09-08 NOTE — Progress Notes (Signed)
Physical Therapy Session Note  Patient Details  Name: Amy [name] MRN: [id number] Date of Birth: Sep 10, 1950  Today's Date: 09/08/2020 PT Individual Time: 0929-1000; [name]: [id number] PT Individual Time Calculation (min): 31 min & 40 min  Short Term Goals: Week 5:  PT Short Term Goal 1 (Week 5): Patient will perform transfers with min A at least 30% of the time. PT Short Term Goal 2 (Week 5): Patient will ambulate 51' with mod A with RW PT Short Term Goal 3 (Week 5): Patient will propel w/c 100' with S PT Short Term Goal 4 (Week 5): Patient will tolerate standing 1 minute with min to mod A for balance for functional task  Skilled Therapeutic Interventions/Progress Updates:    Session1:  Patient in supine without complaint.  Donned Ace wraps and pants in supine with total A.  Patient supine for BP measurements, then supine to sit with mod A.  Seated for BP with S for balance then sit to stand to RW max A and pt stood for BP measurement with mod support for balance with RW.  Stand step to w/c with RW with mod to max A.  Patient assisted in w/c to dayroom where she propelled w/c x 40' with S to look out window.  Assisted to room and set up for transfer to recliner, then OT arrived so hand off to OT.  See BP measurements below.   Session2:  Patient in recliner and agreeable to PT.  Performed stand step to w/c mod/max A.  Transported in w/c to gym and in parallel bars performed sit to stand with max to total A due to limited LE strength.  Ambulated 3 steps forward and 3 back with mod/max A.  Patient sit to stand x 3 more with max A and cues for hand placement, trunk extension, etc.  Assisted in w/c back to room and performed stand step to recliner with mod A and max cues for technique.  PAtient left in recliner with call bell in reach and belt alarm active.   Therapy Documentation Precautions:  Precautions Precautions: Fall Precaution Comments: seizure precautions Restrictions Weight Bearing  Restrictions: No Other Position/Activity Restrictions: head incision General:   Vital Signs: Orthostatic VS for the past 24 hrs (Last 3 readings):  BP- Lying Pulse- Lying BP- Sitting Pulse- Sitting BP- Standing at 0 minutes Pulse- Standing at 0 minutes  09/08/20 0945 140/78 80 122/82 93 119/72 97   Pain: Pain Assessment Pain Score: 0-No pain   Therapy/Group: Individual Therapy  Reginia Naas  Magda Kiel, PT 09/08/2020, 10:17 AM

## 2020-09-08 NOTE — Progress Notes (Signed)
Spoke with [name] to inform of T, TH Sat slot in [place] and she voiced they could not transport on Sat. Informed her thought they could not transport her and she voiced they could if it was M,W,F. Reached out to [facility] navigator to inform unsure if can be changed. Husband is aware of this and will await word of definite plan. Will plan for transfer to facility Thursday, if can be worked out.

## 2020-09-08 NOTE — Progress Notes (Addendum)
[name] Progress Note   Subjective:  Seen in room. Working with therapy this am. No complaints. No cp, sob n/v.  For dialysis today.   Objective Vitals:   09/07/20 2021 09/08/20 0403 09/08/20 0602 09/08/20 0841  BP: 132/74 125/69  126/68  Pulse: 79 73  68  Resp: 19 17    Temp: 98.4 F (36.9 C) 98.3 F (36.8 C)    TempSrc:      SpO2: 100% 98%    Weight:   46.9 kg   Height:       Physical Exam General:Well appearing, frail woman, NAD Heart:RRR; no murmur Lungs:CTA anteriorly Abdomen:soft, non-tender, PD cath in place Extremities:No LE edema Dialysis Access:R IJ TDC, non-tender  Additional Objective Labs: Basic Metabolic Panel: Recent Labs  Lab 09/04/20 0601 09/06/20 1223  NA 124* 123*  K 4.7 4.0  CL 92* 92*  CO2 24 24  GLUCOSE 86 93  BUN 27* 14  CREATININE 3.82* 2.60*  CALCIUM 8.1* 7.9*  PHOS 5.4* 3.8   Liver Function Tests: Recent Labs  Lab 09/04/20 0601 09/06/20 1223  ALBUMIN 1.1* 1.2*   CBC: Recent Labs  Lab 09/04/20 1812 09/06/20 1222 09/08/20 0603  WBC 12.2* 8.3 4.3  HGB 11.8* 11.1* 9.9*  HCT 34.4* 34.1* 30.4*  MCV 96.1 101.8* 99.7  PLT 216 239 190   Medications:  . aspirin EC  81 mg Oral Daily  . atorvastatin  40 mg Oral Daily  . brivaracetam  75 mg Oral BID  . Chlorhexidine Gluconate Cloth  6 each Topical Daily  . citalopram  10 mg Oral Daily  . [START ON 09/09/2020] darbepoetin (ARANESP) injection - DIALYSIS  60 mcg Intravenous Q Tue-HD  . docusate sodium  100 mg Oral BID  . gentamicin cream  1 application Topical Daily  . heparin  5,000 Units Subcutaneous Q8H  . kidney failure book   Does not apply Once  . lacosamide  50 mg Oral BID  . melatonin  5 mg Oral QHS  . metoCLOPramide  5 mg Oral TID AC  . metoprolol tartrate  6.25 mg Oral BID  . multivitamin  1 tablet Oral QHS  . pantoprazole  40 mg Oral Daily  . triamcinolone cream   Topical TID    Dialysis Orders: [place]  followed by Dr. Candiss Norse ([place]). 4 exchanges overnight, 2000 cc and 1.5 hrs each -> transitioned to HD. Renal care manager arranging OP HD plans and PD cath flushes  Assessment/Plan:  1.ESRD: Previously on [facility] [place], f/b Dr Candiss Norse of [place].Bloody effluent seen on takedown 4/15and again 4/28 and 4/29. Not receiving heparin with PD.PD gram stain, cultures and cell count negative for infection.D/t plan for SNF, she was transitioned to HD. S/p [facility] placement 5/5 with HD afterwards ->changing to MWF  to reflect new outpatient schedule - next HD today 2. Hypokalemia:chronic while on PD - should improve on HD, follow. 3.HTN/volume: Significant overload on admit, now euvolemic. EDW will be around 43kg. 4.Fall w/ subdural hematoma: S/pSDH evacuation at [facility] on3/24/22 5. Anemia of ESRD: Hgb 9.9. Getting ESA q Tuesday - will plan to hold next dose if Hgb > 11. 6.MBD: CorrCa mid 10's, Phos ok. No binder or VDRA, follow. 7.Nausea/ vomiting: Intermittent, using Reglan TID. 8. Malnutrition/ debility: Alb very low. Previously had refused changing to HD, now agreeable since will be going to SNF.Has not tolerated protein supplements. 9.Multivessel CAD/HFrEF (25-30% LHC 06/2020):Per TCTSwasnotCABG candidate. UnderwentPCI to LAD. At [facility] repeat echo per  family showed improved EF 55%. 10. Chronichyponatremia: Stable and euvolemic appearing. Off Na tabs. 11. Metabolic acidosis: Stable, off bicarb. 12.HxSeizures-per primary team- vimpat was reduced 4/29 for side effects.  62 Debility: 2/2 Multiple med issues, most recently SDH recovery and NSTEMI w/ CAD and low EF. Readmitted to CIR on 08/04/20.Now planning discharge to SNFwithtransition to HD.  Lynnda Child PA-C [facility] 09/08/2020,9:15 AM   Seen and examined independently.  Agree with note and exam as documented above by physician extender and as noted here.  General adult female in bed in no acute  distress HEENT normocephalic atraumatic extraocular movements intact sclera anicteric Neck supple trachea midline Lungs clear to auscultation bilaterally normal work of breathing at rest; room air  Heart S1S2 no rub Abdomen soft nontender nondistended Extremities trace edema  Psych normal mood and affect Neuro alert and oriented x 3 provides hx and follows commands Access RIJ tunn catheter; PD catheter dressed   ESRD on HD - MWF schedule for now.  (Formerly on PD and last PD catheter flush a couple of days ago per patient.)  Will check with SW to see if CLIPPED.   Hyponatremia - update renal panel and stop celexa. Discussed with patient   Claudia Desanctis, MD 09/08/2020  9:25 AM

## 2020-09-08 NOTE — Progress Notes (Signed)
Occupational Therapy Session Note  Patient Details  Name: Amy [name] MRN: [id number] Date of Birth: [birth year]  Today's Date: 09/08/2020 OT Individual Time: 1000-1110 OT Individual Time Calculation (min): 70 min    Short Term Goals: Week 5:  OT Short Term Goal 1 (Week 5): Patient will complete SPT with consistent min A OT Short Term Goal 2 (Week 5): Patient will complete toileting with mod A OT Short Term Goal 3 (Week 5): patient will complete lower body dressing with min A  Skilled Therapeutic Interventions/Progress Updates:   Patient received sitting in w/c with PT session ending, agreeable to OT session, and no pain reported. Completed grooming and UB bathing activities in w/c at sink set-up A. UB dressing min A. Throughout BADL tasks, pt requiring extra time for processing and initiation of tasks. Mod A to assist washing head/hair due to previous glue from procedures. Pt completed w/c<>BSC/recliner stand pivot transfers (x3) max A using RW and vc's for body mechanics, stepping, and RW management. Pt continent of urine; incontinent and continent of bowels. Pt noted throbbing headache following activity. LB dressing mod A seated and in stance. Encouraged pt to write in memory notebook to improve recall of therapy tasks. Pt left in recliner, alarm set, and call bell in reach with all needs met.  Therapy Documentation Precautions:  Precautions Precautions: Fall Precaution Comments: seizure precautions Restrictions Weight Bearing Restrictions: No Other Position/Activity Restrictions: head incision Pain: Pain Assessment Pain Scale: 0-10 Pain Score: 0-No pain Pain Location: Head Pain Descriptors / Indicators: Throbbing Pain Onset: With Activity Pain Intervention(s): Distraction;Rest   Therapy/Group: Individual Therapy  Mellissa Kohut 09/08/2020, 4:22 PM

## 2020-09-08 NOTE — Procedures (Signed)
Seen and examined on dialysis.  Blood pressure 93/62.  She is out of UF and is to get a dose of albumin per previous orders.  RIJ catheter in use.   Claudia Desanctis, MD 09/08/2020 4:31 PM

## 2020-09-08 NOTE — Progress Notes (Addendum)
Patient ID: Amy [name], female   DOB: January 27, 1951, 70 y.o.   MRN: [id number]  Awaiting confirmation OP-HD accepted at [facility] in [place]. Will await transport set up also, due to will need day and chair slot for this.   2:00 PM Attempted to order hoyer sling for pt for OP-HD and was told since going to SNF they are responsible for getting this for pt. Have left a message for [facility]. No word regarding OP-HD slot at [facility].

## 2020-09-08 NOTE — Progress Notes (Signed)
[facility] PHYSICAL MEDICINE & REHABILITATION PROGRESS NOTE  Subjective/Complaints: Patient seen sitting up in bed this AM.  She states she slept well overnight.  She has questions regarding discharge date.  She was seen by Nephro yesterday, notes reviewed - labs reviewed.  She appears to have tolerated HD yesterday, however, patient does not recall.   ROS: Denies CP, SOB, N/V/D  Objective: Vital Signs: Blood pressure 140/78, pulse 80, temperature 98.3 F (36.8 C), resp. rate 17, height 5\' 4"  (1.626 m), weight 46.9 kg, SpO2 98 %. No results found. Recent Labs    09/06/20 1222 09/08/20 0603  WBC 8.3 4.3  HGB 11.1* 9.9*  HCT 34.1* 30.4*  PLT 239 190   Recent Labs    09/06/20 1223  NA 123*  K 4.0  CL 92*  CO2 24  GLUCOSE 93  BUN 14  CREATININE 2.60*  CALCIUM 7.9*    Intake/Output Summary (Last 24 hours) at 09/08/2020 1246 Last data filed at 09/08/2020 1025 Gross per 24 hour  Intake 600 ml  Output --  Net 600 ml     Pressure Injury 09/06/20 Sacrum Medial Stage 2 -  Partial thickness loss of dermis presenting as a shallow open injury with a red, pink wound bed without slough. small opening- redness around (Active)  09/06/20 2001  Location: Sacrum  Location Orientation: Medial  Staging: Stage 2 -  Partial thickness loss of dermis presenting as a shallow open injury with a red, pink wound bed without slough.  Wound Description (Comments): small opening- redness around  Present on Admission:     Physical Exam: BP 140/78 (BP Location: Left Arm)   Pulse 80   Temp 98.3 F (36.8 C)   Resp 17   Ht 5\' 4"  (1.626 m)   Wt 46.9 kg   SpO2 98%   BMI 17.75 kg/m   Constitutional: No distress . Vital signs reviewed. HENT: Normocephalic.  Atraumatic. Eyes: EOMI. No discharge. Cardiovascular: No JVD.  RRR. Respiratory: Normal effort.  No stridor.  Bilateral clear to auscultation. GI: Non-distended.  BS +. Skin: Warm and dry.  Intact. Psych: Normal mood.  Normal  behavior. Musc: No edema in extremities.  No tenderness in extremities. Neurological: Alert Motor: LUE: 4/5 proximal distal, stable RUE: 4/5 proximal distal LLE: Hip flexion 4/5, knee extension 4/5, ankle dorsiflexion 4-/5, unchanged RLE: Hip flexion 4/5, knee extension 4/5, ankle dorsiflexion 3-3+/5  Assessment/Plan: 1. Functional deficits which require 3+ hours per day of interdisciplinary therapy in a comprehensive inpatient rehab setting.  Physiatrist is providing close team supervision and 24 hour management of active medical problems listed below.  Physiatrist and rehab team continue to assess barriers to discharge/monitor patient progress toward functional and medical goals   Care Tool:  Bathing    Body parts bathed by patient: Right arm,Left arm,Chest,Abdomen,Front perineal area,Face,Right upper leg,Left upper leg,Buttocks,Right lower leg,Left lower leg   Body parts bathed by helper: Right lower leg,Left lower leg,Buttocks     Bathing assist Assist Level: Moderate Assistance - Patient 50 - 74%     Upper Body Dressing/Undressing Upper body dressing   What is the patient wearing?: Pull over shirt    Upper body assist Assist Level: Moderate Assistance - Patient 50 - 74% (due to fatigue)    Lower Body Dressing/Undressing Lower body dressing      What is the patient wearing?: Pants     Lower body assist Assist for lower body dressing: Maximal Assistance - Patient 25 - 49%  Toileting Toileting    Toileting assist Assist for toileting: Maximal Assistance - Patient 25 - 49%     Transfers Chair/bed transfer  Transfers assist     Chair/bed transfer assist level: Maximal Assistance - Patient 25 - 49% Chair/bed transfer assistive device: Walker,Armrests   Locomotion Ambulation   Ambulation assist   Ambulation activity did not occur: Safety/medical concerns  Assist level: Maximal Assistance - Patient 25 - 49% Assistive device: Parallel bars Max  distance: 3'   Walk 10 feet activity   Assist  Walk 10 feet activity did not occur: Safety/medical concerns  Assist level: Moderate Assistance - Patient - 50 - 74% Assistive device: Walker-rolling   Walk 50 feet activity   Assist Walk 50 feet with 2 turns activity did not occur: Safety/medical concerns         Walk 150 feet activity   Assist Walk 150 feet activity did not occur: Safety/medical concerns         Walk 10 feet on uneven surface  activity   Assist Walk 10 feet on uneven surfaces activity did not occur: Safety/medical concerns   Assist level: Moderate Assistance - Patient - 50 - 74% Assistive device: Aeronautical engineer Will patient use wheelchair at discharge?: Yes Type of Wheelchair: Manual    Wheelchair assist level: Supervision/Verbal cueing Max wheelchair distance: 40'    Wheelchair 50 feet with 2 turns activity    Assist    Wheelchair 50 feet with 2 turns activity did not occur: Safety/medical concerns   Assist Level: Supervision/Verbal cueing   Wheelchair 150 feet activity     Assist  Wheelchair 150 feet activity did not occur: Safety/medical concerns        Medical Problem List and Plan: 1.  Right hemiparesis and aphasia secondary to large left SDH s/p craniotomy  Continue CIR 2.  Impaired mobility -DVT/anticoagulation:  Mechanical: Continue Sequential compression devices, below knee Bilateral lower extremities             -antiplatelet therapy: N/A due to bleed.  3. Chronic migraines/ Pain Management: Continue Fioricet 3-4 times a day on average. Added prn Fioricet q8H  Controlled on 5/9  Monitor with increased exertion 4. Mood: LCSW to follow for evaluation and support.  -antipsychotic agents: N/A 5. Neuropsych: This patient is not fully capable of making decisions on her own behalf. 6. Skin/Wound Care: Monitor wound for healing.             -Family to provide meals from home as  able.  -bilateral PRAFOs ordered but patient refuses to wear 7. Fluids/Electrolytes/Nutrition: Continue PD--does not need fluid restriction.              --has had chronic issues with variable intake as well as N/V likely due to gastroparesis.  8. Chronic Hypotension: Continue TEDs when out of bed             Vitals:   09/08/20 0841 09/08/20 0945  BP: 126/68 140/78  Pulse: 68 80  Resp:    Temp:    SpO2:     Relatively controlled on 5/9 9.  ESRD   Transitioned from PD to HD.  HD cath placed on 5/5 10.  Recurrent Seizures: Has been seizure free, continue Brivaracetam and Lacosamide bid.              --Lacosamide was decreased on 03/31 to avoid sedative SE, decreased again on 4/28- appreciate neurology recommendations..  No breakthrough seizures  since admission- 5/9 11. CAD/NSTEMI s/p PCI: Treated medically.   Off DAPT due to bleed   Continue Lipitor.   Metoprolol low-dose resumed  Heart rate controlled on 5/9 12. Acute on chronic anemia:   Hemoglobin 9.9 on 5/9 (?dilutional)  Continue to monitor 13. H/o Depression: Continue Celexa 14. Sick Euthyroid: Will need follow up as illness resolves.              --TSH- 5.80/Free T4-1.2 (03/27) 15. Gastroparesis w/chronic N/V/anorexia: Has been ongoing per records review.              --question failure of PD (husband reports no N/V with brief episode of HD in the past)  Continue Reglan 5 3 times daily  Hesitant to continue increase medications due to #18  Improved overall 16.  Hyponatremia             Chronic hyponatremia with Na-130-133 range  Recs per nephro             --Continue Sodium bicarb to manage metabolic acidosis. 17.  Severe hypoalbuminemia  Supplement d/ced given nausea and vomiting- recommended nuts as source of protein. Her daughter has brought in.   5/7- nephrology giving albumin today.  18.  Prolonged QTC  ECG reviewed, improved since last visit  Compazine and dose/frequency decreased 19.  Sleep disturbance-  "never a good sleeper"  Cont trazodone and melatonin    At baseline 20.  Hypokalemia  Potassium 4.7 on 5/5  Scheduled supplement DC'd.  21. Leukocytosis  WBCs 4.3 on 5/9 (?dilutional)  Afebrile  No signs/symptoms of infection  Cont to monitor  LOS: 35 days A FACE TO FACE EVALUATION WAS PERFORMED  Claudell Wohler Lorie Phenix 09/08/2020, 12:46 PM

## 2020-09-08 NOTE — Progress Notes (Signed)
Speech Language Pathology Daily Session Note  Patient Details  Name: DALEISA HALPERIN MRN: [id number] Date of Birth: 08-Feb-1951  Today's Date: 09/08/2020 SLP Individual Time: 2229-7989 SLP Individual Time Calculation (min): 28 min  Short Term Goals: Week 5: SLP Short Term Goal 1 (Week 5): STG=LTG due to awaiting SNF placement  Skilled Therapeutic Interventions:Skilled ST services focused on cognitive skills. SLP facilitated mildly complex problem solving skills in sequencing 4 step picture cards mod I and required supervision A verbal cues for 6 step picture cards for recall strategies within task. SLP reviewed recall strategies such as thinking aloud. Pt was left in room with call bell within reach and bed alarm set. SLP recommends to continue skilled services.     Pain Pain Assessment Pain Scale: 0-10 Pain Score: 7  Pain Type: Acute pain Pain Location: Head Pain Descriptors / Indicators: Aching Pain Onset: Gradual Pain Intervention(s): Medication (See eMAR)  Therapy/Group: Individual Therapy  Mahrukh Seguin 09/08/2020, 8:00 AM

## 2020-09-08 NOTE — Progress Notes (Signed)
Patient's outpatient HD clinic changed to [facility] in [place], though farther from SNF, per patient/daughter request and to accommodate PD catheter flushes. She is now on a TTS schedule with a 10:45am seat time. She needs to arrive at 10:30am to her appointments and can start on Thursday, 09/11/20. Navigator updated Renal PA/Grace. When updating CIR CSW/B. Dupree, she states that SNF is now stating that they can transport patient [name] previously told that SNF could not transport, and therefore schedule did not matter) and state that patient needs a MWF schedule. Navigator got in touch with [facility] coordinator to see if a MWF schedule is available. She states a MWF was requested and not available, which is why a TTS was given. She states she will message the clinic to put her on a waiting list. Navigator has communicated this to CIR CSW to request that patient's husband arrange other transportation for Saturdays (as previously planned).  Patient will need to start in the clinic on Thursday, 09/11/20, and therefore discharge by Wednesday, 09/10/20. Schedule letter forwarded to CIR CSW and Navigator appreciates CIR CSW communicating above with family.  Alphonzo Cruise, [facility] Navigator [phone]

## 2020-09-09 DIAGNOSIS — G479 Sleep disorder, unspecified: Secondary | ICD-10-CM | POA: Diagnosis not present

## 2020-09-09 DIAGNOSIS — G8918 Other acute postprocedural pain: Secondary | ICD-10-CM | POA: Diagnosis not present

## 2020-09-09 DIAGNOSIS — S065X9A Traumatic subdural hemorrhage with loss of consciousness of unspecified duration, initial encounter: Secondary | ICD-10-CM | POA: Diagnosis not present

## 2020-09-09 DIAGNOSIS — D649 Anemia, unspecified: Secondary | ICD-10-CM | POA: Diagnosis not present

## 2020-09-09 MED ORDER — DARBEPOETIN ALFA 60 MCG/0.3ML IJ SOSY
60.0000 ug | PREFILLED_SYRINGE | Freq: Once | INTRAMUSCULAR | Status: DC
  Filled 2020-09-09: qty 0.3

## 2020-09-09 MED ORDER — DARBEPOETIN ALFA 60 MCG/0.3ML IJ SOSY
60.0000 ug | PREFILLED_SYRINGE | INTRAMUSCULAR | Status: DC
  Filled 2020-09-09: qty 0.3

## 2020-09-09 NOTE — Progress Notes (Signed)
Patient educated on 1000cc fluid restriction, states she is aware of her limitations.

## 2020-09-09 NOTE — Progress Notes (Signed)
Physical Therapy Session Note  Patient Details  Name: Amy [name] MRN: [id number] Date of Birth: 11/03/1950  Today's Date: 09/09/2020 PT Individual Time:Session1: 1030-1130; Session2:1530-1600 PT Individual Time Calculation (min): 60 min & 30 min  Short Term Goals: Week 5:  PT Short Term Goal 1 (Week 5): Patient will perform transfers with min A at least 30% of the time. PT Short Term Goal 2 (Week 5): Patient will ambulate 52' with mod A with RW PT Short Term Goal 3 (Week 5): Patient will propel w/c 100' with S PT Short Term Goal 4 (Week 5): Patient will tolerate standing 1 minute with min to mod A for balance for functional task  Skilled Therapeutic Interventions/Progress Updates:    Session1: Patient in w/c in room following OT session.  PAtient assisted to therapy gym.  She propelled about 61' in gym with cues and supervision.  Patient sit to stand max A to RW and stand step to mat with mod to max A for balance.  PAtient seated for sitting balance reaching up alternating arms x 5, then using yellow therapy ball reaching up overhead then touching ball to R vs L on mat beside her.  She performed backward shoulder rolls x 5 and then 3 reps of 10 sec sitting with increased upright posture. Patient sit to supine on mat on wedge with mod A for LE's.  Performed therex including bridging with 5 sec hold ball b/w legs, SLR with A, hip abduction supine on L and sidelying clamshell on R (doesn't tolerate R sidelying), bridging with legs on therapy ball, lower trunk rotation legs on ball and lower abdominal curls with legs on ball all x 10.  Supine to sit with mod A and performed seated soleus stretches with feet on wedge with passive overpressure 3 x 30 sec.  Patient stand step to w/c with RW and mod A.  Transported to her room in w/c and stand step to recliner with mod A with RW.  Left with call bell and needs in reach.   Session2:Patient in recliner and aware her PT session is 30 minutes.  Also able to  recall going outside with therapist earlier this afternoon.  Patient needing to toilet.  Sit to stand to RW max A and stand step to 3:1 at the bedside with mod A.  Toilet hygiene and clothing management performed with total A.  Patient standing with mod A for balance while assisted for toilet hygiene and clothing management.  Patient stepping to bed with RW and mod A.  Sit to supine mod A and pt positioned for comfort.  Left with bed alarm active and call bell/needs in reach.  Therapy Documentation Precautions:  Precautions Precautions: Fall Precaution Comments: seizure precautions Restrictions Weight Bearing Restrictions: No Other Position/Activity Restrictions: head incision Pain: Pain Assessment Pain Scale: 0-10 Pain Score: 7  Faces Pain Scale: Hurts a little bit Pain Type: Acute pain;Surgical pain Pain Location: Chest Pain Orientation: Right Pain Descriptors / Indicators: Tender Pain Onset: With Activity Pain Intervention(s): Repositioned   Therapy/Group: Individual Therapy  Reginia Naas  Magda Kiel, PT 09/09/2020, 11:18 AM

## 2020-09-09 NOTE — Progress Notes (Addendum)
[facility] Progress Note   Subjective:   Completed dialysis yesterday. Tolerated 1.5L UF. Says treatment was "long" otherwise no issues. No complaints this am.    Objective Vitals:   09/08/20 1911 09/08/20 2202 09/09/20 0335 09/09/20 0811  BP: 140/79 (!) 153/77 129/71 124/68  Pulse: 86 85 80 72  Resp: 16  18   Temp: 97.9 F (36.6 C)  98 F (36.7 C)   TempSrc: Oral     SpO2: 100%  98%   Weight:   45.2 kg   Height:       Physical Exam General:Well appearing, frail woman, NAD Heart:RRR; no murmur Lungs:CTA anteriorly Abdomen:soft, non-tender, PD cath in place Extremities:No LE edema Dialysis Access:R IJ TDC, non-tender  Additional Objective Labs: Basic Metabolic Panel: Recent Labs  Lab 09/04/20 0601 09/06/20 1223 09/08/20 1033  NA 124* 123* 125*  K 4.7 4.0 3.9  CL 92* 92* 91*  CO2 24 24 25   GLUCOSE 86 93 85  BUN 27* 14 12  CREATININE 3.82* 2.60* 2.24*  CALCIUM 8.1* 7.9* 8.2*  PHOS 5.4* 3.8 2.9   Liver Function Tests: Recent Labs  Lab 09/04/20 0601 09/06/20 1223 09/08/20 1033  ALBUMIN 1.1* 1.2* 1.7*   CBC: Recent Labs  Lab 09/04/20 1812 09/06/20 1222 09/08/20 0603  WBC 12.2* 8.3 4.3  HGB 11.8* 11.1* 9.9*  HCT 34.4* 34.1* 30.4*  MCV 96.1 101.8* 99.7  PLT 216 239 190   Medications: . albumin human     . aspirin EC  81 mg Oral Daily  . atorvastatin  40 mg Oral Daily  . brivaracetam  75 mg Oral BID  . Chlorhexidine Gluconate Cloth  6 each Topical Daily  . darbepoetin (ARANESP) injection - DIALYSIS  60 mcg Intravenous Q Tue-HD  . docusate sodium  100 mg Oral BID  . gentamicin cream  1 application Topical Daily  . heparin  5,000 Units Subcutaneous Q8H  . kidney failure book   Does not apply Once  . lacosamide  50 mg Oral BID  . melatonin  5 mg Oral QHS  . metoCLOPramide  5 mg Oral TID AC  . metoprolol tartrate  6.25 mg Oral BID  . multivitamin  1 tablet Oral QHS  . pantoprazole  40 mg Oral Daily  . triamcinolone cream    Topical TID    Dialysis Orders: [facility] followed by Dr. Candiss Norse ([facility]). 4 exchanges overnight, 2000 cc and 1.5 hrs each -> transitioned to HD. Renal care manager arranging OP HD plans and PD cath flushes  Assessment/Plan:  1.ESRD: Previously on [facility] [place], f/b Dr Candiss Norse of [facility].Bloody effluent seen on takedown 4/15and again 4/28 and 4/29. Not receiving heparin with PD.PD gram stain, cultures and cell count negative for infection.D/t plan for SNF, she was transitioned to HD. S/p TDC placement 5/5 with HD afterwards -> Was started on MWF schedule but nowhas outpatient spot [facility] TTS 10:45 am seat time starting 09/11/20. Dialyzed 5/9 - no urgent indication for dialysis today -will follow labs.  2. Hypokalemia:chronic while on PD - improved on HD  3.HTN/volume: Significant overload on admit, now euvolemic. EDW will be around 43kg. 4.Fall w/ subdural hematoma: [facility]SD[facility] 5. Anemia of ESRD: Hgb 9.9. Getting ESA q Tuesday - will plan to hold next dose if Hgb > 11. 6.MBD: CorrCa mid 10's, Phos ok. No binder or VDRA, follow. 7.Nausea/ vomiting: Intermittent, using Reglan TID. 8. Malnutrition/ debility: Alb very low. Previously had refused changing  to HD, now agreeable since will be going to SNF.Has not tolerated protein supplements. 9.Multivessel CAD/HFrEF (25-30% LHC 06/2020):Per TCTSwasnotCABG candidate. UnderwentPCI to LAD. At [facility] repeat echo per family showed improved EF 55%. 10. Chronichyponatremia: Stable and euvolemic appearing. Off Na tabs. 11. Metabolic acidosis: Stable, off bicarb. 12.HxSeizures-per primary team- vimpat was reduced 4/29 for side effects.  60 Debility: 2/2 Multiple med issues, most recently SDH recovery and NSTEMI w/ CAD and low EF. Readmitted to CIR on 08/04/20.Now planning discharge to SNFwithtransition to HD.  Lynnda Child PA-C [facility] 09/09/2020,8:55  AM   Seen and examined independently.  Agree with note and exam as documented above by physician extender and as noted here.  Patient feels ok - just hasn't felt like she's had energy for a while.  ESRD - for HD tomorrow.  Will continue with HD per MWF schedule for now as it seems schedule for outpatient is in flux and facility is requesting a MWF time.    Hyponatremia - renal panel in AM. Off of SSRI now.   Anemia CKD - Last Hb 9.9 - for ESA today (will need tomorrow if she doesn't get today)  Claudia Desanctis, MD 09/09/2020  9:56 AM

## 2020-09-09 NOTE — Progress Notes (Signed)
Occupational Therapy Weekly Progress Note  Patient Details  Name: Amy [name] MRN: [id number] Date of Birth: 07-18-1950  Beginning of progress report period: Sep 02, 2020 End of progress report period: Sep 09, 2020  Today's Date: 09/09/2020 OT Individual Time: 0900-1007 OT Individual Time Calculation (min): 67 min    Patient has met 0 of 3 short term goals. Patient had HD cath procedure this week which limited caloric intake and OOB activity, further decreasing her activity tolerance and progress towards goals in mobility and BADLs.   Patient continues to demonstrate the following deficits: muscle weakness, decreased cardiorespiratoy endurance, decreased memory and decreased sitting balance, decreased standing balance, decreased postural control and decreased balance strategies and therefore will continue to benefit from skilled OT intervention to enhance overall performance with BADL.  Patient progressing toward long term goals. Because Pt will d/c to SNF, continue to maximize functional independence and reducing caregiver burden in OT sessions. Continue plan of care.  OT Short Term Goals Week 5:  OT Short Term Goal 1 (Week 5): Patient will complete SPT with consistent min A OT Short Term Goal 1 - Progress (Week 5): Progressing toward goal OT Short Term Goal 2 (Week 5): Patient will complete toileting with mod A OT Short Term Goal 2 - Progress (Week 5): Progressing toward goal OT Short Term Goal 3 (Week 5): patient will complete lower body dressing with min A OT Short Term Goal 3 - Progress (Week 5): Progressing toward goal See Week 6 goals.  Skilled Therapeutic Interventions/Progress Updates:    Pt received supine in bed with husband present, agreeable to OT session, and headache reported (see below). Discussed with husband d/c plan and current pt status and progress with husband encouraging pt to participate in therapies to return home sooner. Sup>sitting EOB max A using bed rails  with noted posterior lean. Sat EOB to improve sitting balance with improved posture following vc's. Stand pivot transfers bed>w/c<>BSC max A with increased time and vc's for steps, body mechanics, and processing time; noted increased fatigue with each transfer. Pt setup assist to complete oral care and washing face sitting at sink. UB dressing min A; UB bathing setup assist; LB bathing max A for perineal and buttocks areas. Once on [facility], pt continent of bowels but requiring total A for toileting to pull pants and underwear up/down and peri hygiene. Pt left in w/c with alarm set, all needs met, and call bell in hand.   Therapy Documentation Precautions:  Precautions Precautions: Fall Precaution Comments: seizure precautions Restrictions Weight Bearing Restrictions: No Other Position/Activity Restrictions: head incision Pain: Pain Assessment Pain Scale: 0-10 Pain Score: 7  Pain Location: Head Pain Descriptors / Indicators: Aching Pain Intervention(s): Rest;Distraction   Therapy/Group: Individual Therapy  Mellissa Kohut 09/09/2020, 11:12 AM

## 2020-09-09 NOTE — Progress Notes (Signed)
Occupational Therapy Session Note  Patient Details  Name: Amy [name] MRN: [id number] Date of Birth: May 27, 1950  Today's Date: 09/09/2020 OT Individual Time: 1345-1438 OT Individual Time Calculation (min): 53 min    Short Term Goals: Week 6:  OT Short Term Goal 1 (Week 6): Patient will complete SPT consistently with min A. OT Short Term Goal 2 (Week 6): Patient will complete toileting mod A. OT Short Term Goal 3 (Week 6): Patient will complete lower body dressing with min A.  Skilled Therapeutic Interventions/Progress Updates:    Pt received sitting in recliner and reporting headache (7 out of 10 pain). Pt reported she had emesis episode following lunch and was still feeling slightly nauseous. Agreeable to outside OT session to get fresh air. Sit>stand with RW mod A with vc's and increased time for processing. Stand pivot transfer from recliner to w/c max A +2 to initiate steps, body mechanics, RW management, and vc's to head the correct direction to w/c. Noted increased internal rotation, inversion,  and variable initiation of step in R foot. Once outside, pt engaged in UE and LE (targeting RLE) conditioning activities and stretches to improve posture, muscle activation, and activity tolerance in preparation for BADLs and functional mobility. Engaged in therapeutic listening and rapport building to improve pt buy-in to therapies. Pt left in w/c for next PT session with alarm set and all needs met.   Therapy Documentation Precautions:  Precautions Precautions: Fall Precaution Comments: seizure precautions Restrictions Weight Bearing Restrictions: No Other Position/Activity Restrictions: head incision  Pain: Pain Assessment Pain Scale: 0-10 Pain Score: 7  Pain Type: Acute pain Pain Location: Head Pain Descriptors / Indicators: Aching Pain Frequency: Intermittent Pain Onset: On-going Patients Stated Pain Goal: 0 Pain Intervention(s): Distraction;Relaxation;Rest Multiple Pain  Sites: No   Therapy/Group: Individual Therapy  Mellissa Kohut 09/09/2020, 3:23 PM

## 2020-09-09 NOTE — Progress Notes (Signed)
Patient ID: Amy [name], female   DOB: 02/02/1951, 70 y.o.   MRN: [id number] Spoke with [facility] they can not take until Monday due to can;t start OP-HD Sat and transport needs to be arranged, along with open bed for pt. Have let [facility]-Renal liaison know and pt and husband aware of this. Made aware on waiting list for M,W,F slot for OP-HD. Husband to arrange transport for Sat due to facility can not provide transportation on that day.

## 2020-09-09 NOTE — Progress Notes (Signed)
[facility] PHYSICAL MEDICINE & REHABILITATION PROGRESS NOTE  Subjective/Complaints: Patient seen laying in bed this AM.  She states she did not sleep well overnight and states she normally sleeps very well at home (this does not appear to be the case based on previous discussions with patient and family). She wants to know when she will be discharged. She was seen by Nephro yesterday, notes reviewed - tolerating HD.  ROS: Denies CP, SOB, N/V/D  Objective: Vital Signs: Blood pressure 124/68, pulse 72, temperature 98 F (36.7 C), resp. rate 18, height 5\' 4"  (1.626 m), weight 45.2 kg, SpO2 98 %. No results found. Recent Labs    09/06/20 1222 09/08/20 0603  WBC 8.3 4.3  HGB 11.1* 9.9*  HCT 34.1* 30.4*  PLT 239 190   Recent Labs    09/06/20 1223 09/08/20 1033  NA 123* 125*  K 4.0 3.9  CL 92* 91*  CO2 24 25  GLUCOSE 93 85  BUN 14 12  CREATININE 2.60* 2.24*  CALCIUM 7.9* 8.2*    Intake/Output Summary (Last 24 hours) at 09/09/2020 1154 Last data filed at 09/09/2020 0853 Gross per 24 hour  Intake 450 ml  Output 1503 ml  Net -1053 ml     Pressure Injury 09/06/20 Sacrum Medial Stage 2 -  Partial thickness loss of dermis presenting as a shallow open injury with a red, pink wound bed without slough. small opening- redness around (Active)  09/06/20 2001  Location: Sacrum  Location Orientation: Medial  Staging: Stage 2 -  Partial thickness loss of dermis presenting as a shallow open injury with a red, pink wound bed without slough.  Wound Description (Comments): small opening- redness around  Present on Admission:     Physical Exam: BP 124/68   Pulse 72   Temp 98 F (36.7 C)   Resp 18   Ht 5\' 4"  (1.626 m)   Wt 45.2 kg   SpO2 98%   BMI 17.10 kg/m   Constitutional: No distress . Vital signs reviewed. HENT: Normocephalic.  Atraumatic. Eyes: EOMI. No discharge. Cardiovascular: No JVD.  RRR. Respiratory: Normal effort.  No stridor.  Bilateral clear to auscultation. GI:  Non-distended.  BS +. Skin: Warm and dry.  Intact. Psych: Normal mood.  Normal behavior. Musc: No edema in extremities.  No tenderness in extremities. Neuro:  Motor: LUE: 4/5 proximal distal, unchanged RUE: 4/5 proximal distal LLE: Hip flexion 4/5, knee extension 4/5, ankle dorsiflexion 4-/5, unchanged RLE: Hip flexion 4/5, knee extension 4/5, ankle dorsiflexion 3-3+/5  Assessment/Plan: 1. Functional deficits which require 3+ hours per day of interdisciplinary therapy in a comprehensive inpatient rehab setting.  Physiatrist is providing close team supervision and 24 hour management of active medical problems listed below.  Physiatrist and rehab team continue to assess barriers to discharge/monitor patient progress toward functional and medical goals   Care Tool:  Bathing    Body parts bathed by patient: Right arm,Left arm,Chest,Abdomen,Face,Right upper leg,Left upper leg   Body parts bathed by helper: Buttocks     Bathing assist Assist Level: Moderate Assistance - Patient 50 - 74%     Upper Body Dressing/Undressing Upper body dressing   What is the patient wearing?: Pull over shirt    Upper body assist Assist Level: Minimal Assistance - Patient > 75%    Lower Body Dressing/Undressing Lower body dressing      What is the patient wearing?: Pants     Lower body assist Assist for lower body dressing: Maximal Assistance - Patient  25 - 49%     Toileting Toileting    Toileting assist Assist for toileting: Maximal Assistance - Patient 25 - 49%     Transfers Chair/bed transfer  Transfers assist     Chair/bed transfer assist level: Maximal Assistance - Patient 25 - 49% Chair/bed transfer assistive device: Programmer, multimedia   Ambulation assist   Ambulation activity did not occur: Safety/medical concerns  Assist level: Maximal Assistance - Patient 25 - 49% Assistive device: Parallel bars Max distance: 3'   Walk 10 feet activity   Assist   Walk 10 feet activity did not occur: Safety/medical concerns  Assist level: Moderate Assistance - Patient - 50 - 74% Assistive device: Walker-rolling   Walk 50 feet activity   Assist Walk 50 feet with 2 turns activity did not occur: Safety/medical concerns         Walk 150 feet activity   Assist Walk 150 feet activity did not occur: Safety/medical concerns         Walk 10 feet on uneven surface  activity   Assist Walk 10 feet on uneven surfaces activity did not occur: Safety/medical concerns   Assist level: Moderate Assistance - Patient - 50 - 74% Assistive device: Aeronautical engineer Will patient use wheelchair at discharge?: Yes Type of Wheelchair: Manual    Wheelchair assist level: Supervision/Verbal cueing Max wheelchair distance: 40'    Wheelchair 50 feet with 2 turns activity    Assist    Wheelchair 50 feet with 2 turns activity did not occur: Safety/medical concerns   Assist Level: Supervision/Verbal cueing   Wheelchair 150 feet activity     Assist  Wheelchair 150 feet activity did not occur: Safety/medical concerns        Medical Problem List and Plan: 1.  Right hemiparesis and aphasia secondary to large left SDH s/p craniotomy  Continue CIR 2.  Impaired mobility -DVT/anticoagulation:  Mechanical: Continue Sequential compression devices, below knee Bilateral lower extremities             -antiplatelet therapy: N/A due to bleed.  3. Chronic migraines/ Pain Management: Continue Fioricet 3-4 times a day on average. Added prn Fioricet q8H  Controlled on 5/10  Monitor with increased exertion 4. Mood: LCSW to follow for evaluation and support.  -antipsychotic agents: N/A 5. Neuropsych: This patient is not fully capable of making decisions on her own behalf. 6. Skin/Wound Care: Monitor wound for healing.             -Family to provide meals from home as able.  -bilateral PRAFOs ordered but patient refuses to  wear 7. Fluids/Electrolytes/Nutrition: Continue PD--does not need fluid restriction.              --has had chronic issues with variable intake as well as N/V likely due to gastroparesis.  8. Chronic Hypotension: Continue TEDs when out of bed             Vitals:   09/09/20 0335 09/09/20 0811  BP: 129/71 124/68  Pulse: 80 72  Resp: 18   Temp: 98 F (36.7 C)   SpO2: 98%    Relatively controlled on 5/10 9.  ESRD   Transitioned from PD to HD.  HD cath placed on 5/5 10.  Recurrent Seizures: Has been seizure free, continue Brivaracetam and Lacosamide bid.              --Lacosamide was decreased on 03/31 to avoid sedative SE, decreased  again on 4/28- appreciate neurology recommendations..  No breakthrough seizures since admission- 5/10 11. CAD/NSTEMI s/p PCI: Treated medically.   Off DAPT due to bleed   Continue Lipitor.   Metoprolol low-dose resumed  Heart rate controlled on 5/10 12. Acute on chronic anemia:   Hemoglobin 9.9 on 5/9 (?dilutional)  Continue to monitor 13. H/o Depression: Continue Celexa 14. Sick Euthyroid: Will need follow up as illness resolves.              --TSH- 5.80/Free T4-1.2 (03/27) 15. Gastroparesis w/chronic N/V/anorexia: Has been ongoing per records review.              --question failure of PD (husband reports no N/V with brief episode of HD in the past)  Continue Reglan 5 3 times daily  Hesitant to continue increase medications due to #18  Improved overall 16.  Hyponatremia             Chronic hyponatremia with Na-130-133 range  Recs per nephro             --Continue Sodium bicarb to manage metabolic acidosis. 17.  Severe hypoalbuminemia  Supplement d/ced given nausea and vomiting- recommended nuts as source of protein. Her daughter has brought in.   5/7- nephrology giving albumin today.  18.  Prolonged QTC  ECG reviewed, improved since last visit  Compazine and dose/frequency decreased 19.  Sleep disturbance- "never a good sleeper"  Cont trazodone  and melatonin    Likely at baseline 20.  Hypokalemia  Potassium 3.9 on 5/5  Scheduled supplement DC'd, monitor.  21. Leukocytosis  WBCs 4.3 on 5/9 (?dilutional)  Afebrile  No signs/symptoms of infection  Cont to monitor  LOS: 36 days A FACE TO FACE EVALUATION WAS PERFORMED  Ashten Prats Lorie Phenix 09/09/2020, 11:54 AM

## 2020-09-09 NOTE — Progress Notes (Signed)
Renal Navigator has been notified by CIR CSW/B. Dupree that patient cannot be accepted at SNF/[facility] until Monday, 09/15/20. Navigator has requested of Nephrology that we keep patient on new outpatient schedule TTS this week for discharge. If medically stable, she will have next HD Thursday, then Saturday and then first outpatient HD on Tuesday, 09/16/20. She is on a MWF wait list at the outpatient clinic, which may not be any time soon.  Navigator has updated [facility] on patient's new outpatient start date.   Alphonzo Cruise, Cash Renal Navigator [phone]

## 2020-09-10 DIAGNOSIS — S065X9A Traumatic subdural hemorrhage with loss of consciousness of unspecified duration, initial encounter: Secondary | ICD-10-CM | POA: Diagnosis not present

## 2020-09-10 DIAGNOSIS — G8918 Other acute postprocedural pain: Secondary | ICD-10-CM | POA: Diagnosis not present

## 2020-09-10 DIAGNOSIS — D649 Anemia, unspecified: Secondary | ICD-10-CM | POA: Diagnosis not present

## 2020-09-10 DIAGNOSIS — G479 Sleep disorder, unspecified: Secondary | ICD-10-CM | POA: Diagnosis not present

## 2020-09-10 LAB — RENAL FUNCTION PANEL
Albumin: 1.3 g/dL — ABNORMAL LOW (ref 3.5–5.0)
Anion gap: 4 — ABNORMAL LOW (ref 5–15)
BUN: 9 mg/dL (ref 8–23)
CO2: 25 mmol/L (ref 22–32)
Calcium: 8.3 mg/dL — ABNORMAL LOW (ref 8.9–10.3)
Chloride: 97 mmol/L — ABNORMAL LOW (ref 98–111)
Creatinine, Ser: 1.89 mg/dL — ABNORMAL HIGH (ref 0.44–1.00)
GFR, Estimated: 28 mL/min — ABNORMAL LOW (ref >60)
Glucose, Bld: 81 mg/dL (ref 70–99)
Phosphorus: 2.7 mg/dL (ref 2.5–4.6)
Potassium: 3.9 mmol/L (ref 3.5–5.1)
Sodium: 126 mmol/L — ABNORMAL LOW (ref 135–145)

## 2020-09-10 LAB — CBC
HCT: 29.6 % — ABNORMAL LOW (ref 36.0–46.0)
Hemoglobin: 9.5 g/dL — ABNORMAL LOW (ref 12.0–15.0)
MCH: 32.1 pg (ref 26.0–34.0)
MCHC: 32.1 g/dL (ref 30.0–36.0)
MCV: 100 fL (ref 80.0–100.0)
Platelets: 171 10*3/uL (ref 150–400)
RBC: 2.96 MIL/uL — ABNORMAL LOW (ref 3.87–5.11)
RDW: 15.9 % — ABNORMAL HIGH (ref 11.5–15.5)
WBC: 4.2 10*3/uL (ref 4.0–10.5)
nRBC: 0 % (ref 0.0–0.2)

## 2020-09-10 MED ORDER — DARBEPOETIN ALFA 60 MCG/0.3ML IJ SOSY
60.0000 ug | PREFILLED_SYRINGE | INTRAMUSCULAR | Status: DC
  Filled 2020-09-10: qty 0.3

## 2020-09-10 NOTE — Plan of Care (Signed)
  Problem: RH Balance Goal: LTG Patient will maintain dynamic standing balance (PT) Description: LTG:  Patient will maintain dynamic standing balance with assistance during mobility activities (PT) Flowsheets (Taken 09/10/2020 1711) LTG: Pt will maintain dynamic standing balance during mobility activities with:: (Downgraded due to slow progress) Moderate Assistance - Patient 50 - 74%   Problem: Sit to Stand Goal: LTG:  Patient will perform sit to stand with assistance level (PT) Description: LTG:  Patient will perform sit to stand with assistance level (PT) Flowsheets (Taken 09/10/2020 1711) LTG: PT will perform sit to stand in preparation for functional mobility with assistance level: (downgraded due to slow progress, plan for SNF) Moderate Assistance - Patient 50 - 74%   Problem: RH Bed to Chair Transfers Goal: LTG Patient will perform bed/chair transfers w/assist (PT) Description: LTG: Patient will perform bed to chair transfers with assistance (PT). Flowsheets (Taken 09/10/2020 1711) LTG: Pt will perform Bed to Chair Transfers with assistance level: (downgraded due to slow progress and plan for SNF) Moderate Assistance - Patient 50 - 74%   Problem: RH Car Transfers Goal: LTG Patient will perform car transfers with assist (PT) Description: LTG: Patient will perform car transfers with assistance (PT). Outcome: Completed/Met Flowsheets (Taken 09/10/2020 1711) LTG: Pt will perform car transfers with assist:: (discontinued due to plan for SNF) --   Problem: RH Ambulation Goal: LTG Patient will ambulate in controlled environment (PT) Description: LTG: Patient will ambulate in a controlled environment, # of feet with assistance (PT). Flowsheets (Taken 09/10/2020 1711) LTG: Pt will ambulate in controlled environ  assist needed:: Moderate Assistance - Patient 50 - 74% LTG: Ambulation distance in controlled environment: (downgraded due to slow progress, plan for SNF) 10' Goal: LTG Patient will  ambulate in home environment (PT) Description: LTG: Patient will ambulate in home environment, # of feet with assistance (PT). Flowsheets (Taken 09/10/2020 1711) LTG: Pt will ambulate in home environ  assist needed:: (downgraded due to slow progress, plan for SNF) Moderate Assistance - Patient 50 - 74% LTG: Ambulation distance in home environment: 10'   Problem: RH Stairs Goal: LTG Patient will ambulate up and down stairs w/assist (PT) Description: LTG: Patient will ambulate up and down # of stairs with assistance (PT) Outcome: Completed/Met Flowsheets (Taken 09/10/2020 1711) LTG: Pt will ambulate up/down stairs assist needed:: (discontinued due to plan for SNF) --  Magda Kiel, PT

## 2020-09-10 NOTE — Progress Notes (Signed)
Nutrition Follow-up  DOCUMENTATION CODES:   Underweight,Severe malnutrition in context of chronic illness  INTERVENTION:   - Continue MagicCup TID with meals, each supplement provides 290 kcal and 9 grams of protein  - Continue chocolate milk TID with mealsper pt request  -Continue vanilla ice cream TID with meals (to mix with orange cream Magic Cups)  - Encourage adequate PO  NUTRITION DIAGNOSIS:   Severe Malnutrition related to chronic illness (ESRD) as evidenced by severe fat depletion,severe muscle depletion.  Ongoing, being addressed via supplements  GOAL:   Patient will meet greater than or equal to 90% of their needs  Progressing  MONITOR:   PO intake,Supplement acceptance,Labs,Weight trends,I & O's  REASON FOR ASSESSMENT:   Other (history of malnutrition)    ASSESSMENT:   70 year old female with PMH of ESRD on PD, chronic hyponatremia, C diff colitis, seizures, NSTEMI s/p PCI w/ DAPT, chronic issues with N/V resulting in debility and CIR stay 3/04-3/24/22. Pt was discharged to home and experienced a mild fall that afternoon followed by obtundation that evening. Pt was evaluated at [facility] 07/24/20 and found to have acute on chronic left SDH. Pt was transferred to [facility] and underwent left partial craniotomy for evacuation of SDH with placement of subdural drain which was removed on 3/28. Cortrak placed at [facility] for nutrition support. Admitted to CIR on 4/04.  5/05 - s/p tunneled HD catheter placement  Pt is transitioning from PD to HD. Plan is for pt to d/c to SNF on 5/16.  Pt's last HD was on 09/08/20 with 1500 ml net UF. Post-HD weight was 45 kg. Per Nephrology note, pt's EDW is 41 kg. Next HD planned for 5/12. Pt's new HD schedule is TTS.  Briefly saw pt in the hallway during therapy. Pt's PO intake has improved overall since last RD assessment with meal completions charted as 50-85%. No reports of N/V in notes over the last week.  CIR admission weight: 55.5  kg Current weight:46.1 kg  Meal Completion: 50-85%  Medications reviewed and include: aranesp weekly, colace, melatonin, reglan TID, rena-vit, protonix  Labs reviewed: sodium 126, hemoglobin 9.5, albumin 1.3  UOP: 150 ml x 24 hours  Diet Order:   Diet Order            Diet regular Room service appropriate? Yes; Fluid consistency: Thin  Diet effective now                 EDUCATION NEEDS:   Education needs have been addressed  Skin:  Skin Assessment: Skin Integrity Issues: Stage II: sacrum Incisions: head  Last BM:  09/10/20 small type 5  Height:   Ht Readings from Last 1 Encounters:  08/04/20 5\' 4"  (1.626 m)    Weight:   Wt Readings from Last 1 Encounters:  09/10/20 46.1 kg    BMI:  Body mass index is 17.45 kg/m.  Estimated Nutritional Needs:   Kcal:  1700-1900  Protein:  85-100 grams  Fluid:  1000 ml + UOP    Gustavus Bryant, MS, RD, LDN Inpatient Clinical Dietitian Please see AMiON for contact information.

## 2020-09-10 NOTE — Progress Notes (Addendum)
[name] Progress Note   Subjective:   Seen in room. No new complaints. Happy to hear about upcoming discharge.    Objective Vitals:   09/09/20 2003 09/09/20 2256 09/10/20 0350 09/10/20 0600  BP: 139/80 133/79 (!) 143/82   Pulse: 93 92 83   Resp: 16  18   Temp: 98.2 F (36.8 C)  98.4 F (36.9 C)   TempSrc:   Oral   SpO2: 99%  99%   Weight:    46.1 kg  Height:       Physical Exam General:Well appearing, frail woman, NAD Heart:RRR; no murmur Lungs:CTA anteriorly Abdomen:soft, non-tender, PD cath in place Extremities:No LE edema Dialysis Access:R IJ TDC, non-tender  Additional Objective Labs: Basic Metabolic Panel: Recent Labs  Lab 09/06/20 1223 09/08/20 1033 09/10/20 0453  NA 123* 125* 126*  K 4.0 3.9 3.9  CL 92* 91* 97*  CO2 24 25 25   GLUCOSE 93 85 81  BUN 14 12 9   CREATININE 2.60* 2.24* 1.89*  CALCIUM 7.9* 8.2* 8.3*  PHOS 3.8 2.9 2.7   Liver Function Tests: Recent Labs  Lab 09/06/20 1223 09/08/20 1033 09/10/20 0453  ALBUMIN 1.2* 1.7* 1.3*   CBC: Recent Labs  Lab 09/04/20 1812 09/06/20 1222 09/08/20 0603 09/10/20 0453  WBC 12.2* 8.3 4.3 4.2  HGB 11.8* 11.1* 9.9* 9.5*  HCT 34.4* 34.1* 30.4* 29.6*  MCV 96.1 101.8* 99.7 100.0  PLT 216 239 190 171   Medications: . albumin human     . aspirin EC  81 mg Oral Daily  . atorvastatin  40 mg Oral Daily  . brivaracetam  75 mg Oral BID  . Chlorhexidine Gluconate Cloth  6 each Topical Daily  . darbepoetin (ARANESP) injection - DIALYSIS  60 mcg Intravenous Q Wed-HD  . docusate sodium  100 mg Oral BID  . gentamicin cream  1 application Topical Daily  . heparin  5,000 Units Subcutaneous Q8H  . kidney failure book   Does not apply Once  . lacosamide  50 mg Oral BID  . melatonin  5 mg Oral QHS  . metoCLOPramide  5 mg Oral TID AC  . metoprolol tartrate  6.25 mg Oral BID  . multivitamin  1 tablet Oral QHS  . pantoprazole  40 mg Oral Daily  . triamcinolone cream   Topical TID     Dialysis Orders: [facility] followed by Dr. Candiss Norse ([place]). 4 exchanges overnight, 2000 cc and 1.5 hrs each -> transitioned to HD. Renal care manager arranging OP HD plans and PD cath flushes  Assessment/Plan:  1.ESRD: Previously on [facility] [place], f/b Dr Candiss Norse of [place].Bloody effluent seen on takedown 4/15and again 4/28 and 4/29. Not receiving heparin with PD.PD gram stain, cultures and cell count negative for infection.D/t plan for SNF, she was transitioned to HD. S/p TDC placement 5/5 with HD afterwards -> Was started on MWF schedule but nowhas outpatient spot [facility] TTS 10:45 am seat time. Plans for discharge 5/16. Will continue on TTS schedule until discharge. Next HD 5/12.  2. Hypokalemia:chronic while on PD - improved on HD  3.HTN/volume:  BP stable. Significant overload on admit, now euvolemic. EDW will be around 43kg. 4.Fall w/ subdural hematoma: S/pSDH evacuation at [facility] on3/24/22 5. Anemia of ESRD: Hgb 9.5. Getting Darbe 60 q week. Plan next dose with HD 5/12.  6.MBD: CorrCa mid 10's, Phos ok. No binder or VDRA, follow. 7.Nausea/ vomiting: Intermittent, using Reglan TID. 8. Malnutrition/ debility: Alb very low. Previously had  refused changing to HD, now agreeable since will be going to SNF.Has not tolerated protein supplements. 9.Multivessel CAD/HFrEF (25-30% LHC 06/2020):Per TCTSwasnotCABG candidate. UnderwentPCI to LAD. At [facility] repeat echo per family showed improved EF 55%. 10. Chronichyponatremia: Stable and euvolemic appearing. Off Na tabs. 11. Metabolic acidosis: Stable, off bicarb. 12.HxSeizures-per primary team- vimpat was reduced 4/29 for side effects.  60 Debility: 2/2 Multiple med issues, most recently SDH recovery and NSTEMI w/ CAD and low EF. Readmitted to CIR on 08/04/20.Now planning discharge to SNFwithtransition to HD.  Amy Child PA-C [facility] 09/10/2020,9:18 AM   Seen  and examined independently.  Agree with note and exam as documented above by physician extender and as noted here.  General adult female in bed in no acute distress HEENT normocephalic atraumatic extraocular movements intact sclera anicteric Neck supple trachea midline Lungs clear to auscultation bilaterally normal work of breathing at rest  Heart S1S2 no rub Abdomen soft nontender nondistended; PD catheter dressed Extremities no edema  Psych normal mood and affect Neuro alert and oriented x 3 provides hx and follows commands Access: RIJ tunn catheter   ESRD on HD - HD tomorrow per her new TTS schedule  Hyponatremia - off of SSRI. Hopeful for improvement with HD   PD catheter in situ - will request that her PD catheter be flushed tomorrow   Claudia Desanctis, MD 09/10/2020  9:38 AM

## 2020-09-10 NOTE — Progress Notes (Signed)
Physical Therapy Weekly Progress Note  Patient Details  Name: Amy [name] MRN: [id number] Date of Birth: 09-20-1950  Beginning of progress report period: Sep 03, 2020 End of progress report period: Sep 10, 2020  Today's Date: 09/10/2020 PT Individual Time: 1000-1054 PT Individual Time Calculation (min): 54 min   Patient has met 0 of 4 short term goals.  Patient progressing towards goals again after limited progress over 3 day period when waiting for hemodialysis access.  Patient has been able to stand for up to 45 seconds when having assist for hygiene and clothing management after toileting, but fatigues quickly.  She can perform transfers mod A for most transfers, but continues to have weaker days when she needs max A.  She has only ambulated about 3' in parallel bars this week and noting increased weakness on R LE in stance and tightness in heel cords as well despite efforts to get PRAFO's applied at night.  Patient now planning SNF placement.   Patient continues to demonstrate the following deficits muscle weakness, decreased coordination and decreased motor planning and decreased sitting balance, decreased standing balance and hemiplegia and therefore will continue to benefit from skilled PT intervention to increase functional independence with mobility.  Patient not progressing toward long term goals.  See goal revision..  Plan of care revisions: Plan for discharge to SNF.[name]  PT Short Term Goals Week 5:  PT Short Term Goal 1 (Week 5): Patient will perform transfers with min A at least 30% of the time. PT Short Term Goal 1 - Progress (Week 5): Not met PT Short Term Goal 2 (Week 5): Patient will ambulate 49' with mod A with RW PT Short Term Goal 2 - Progress (Week 5): Not met PT Short Term Goal 3 (Week 5): Patient will propel w/c 100' with S PT Short Term Goal 3 - Progress (Week 5): Progressing toward goal PT Short Term Goal 4 (Week 5): Patient will tolerate standing 1 minute with min to  mod A for balance for functional task PT Short Term Goal 4 - Progress (Week 5): Progressing toward goal Week 6:  PT Short Term Goal 1 (Week 6): STG=LTG due to ELOS  Skilled Therapeutic Interventions/Progress Updates:  Patient in supine after OT session.  OT stated pt may need to use BSC since unable to urinate on bed pan.  Patient already with ACE wraps on so pants applied with max A rolling in the bed.  Patient performed supine to sit mod A.  Sit to stand to RW and step to w/c with mod A pt sitting with uncontrolled descent so lowered into w/c.  Patient propelled 30' in w/c to nursing station.  Performed LE therex on Kinetron @ 40 cm/sec 3 x 1 minute bouts.  Patient performed squat pivot transfer to mat mod A.  Seated with feet on step for core/trunk work using 1kg weighted ball moving ball in and out while rocking fwd and back, lateral weight shifts moving ball hip to hip and ball shoulder to opposite hip all x 10 reps.  Patient performed backward shoulder rolls x 10 and then sit <>stand x 3 reps to RW mod A max cues for foot placement, forward weight shift and hand placement.  Patient squat pivot to w/c mod A.  Assisted in w/c to room.  Stand step to recliner with RW and mod A with mod cues.  Patient scooting back in recliner mod A lateral weight shifts.  Patient left with alarm belt on, call bell  and needs in reach.  Ambulation/gait training;Balance/vestibular training;Cognitive remediation/compensation;Functional mobility training;Patient/family education;Therapeutic Exercise;UE/LE Coordination activities;Splinting/orthotics;Therapeutic Activities;Skin care/wound management;Pain management;Discharge planning;DME/adaptive equipment instruction;Neuromuscular re-education;Stair training;UE/LE Strength taining/ROM;Wheelchair propulsion/positioning   Therapy Documentation Precautions:  Precautions Precautions: Fall Precaution Comments: seizure precautions Restrictions Weight Bearing Restrictions:  No Other Position/Activity Restrictions: head incision Pain: Pain Assessment Pain Scale: Faces Pain Score: 0-No pain  Therapy/Group: Individual Therapy  Reginia Naas  Magda Kiel, PT 09/10/2020, 12:20 PM

## 2020-09-10 NOTE — Progress Notes (Signed)
Patient ID: Amy [name], female   DOB: 07-Jul-1950, 70 y.o.   MRN: [id number]  Met with pt and spoke with husband via telephone to discuss team conference progress this week and plan for transfer to [facility] Monday. Pt reports HD is going well and she doesn;t feel any different with this over PD. She reports we are all asking her this same question. She is trying to keep positive and motivated after long hospitalization. She is looking forward to a new environment and to be close to home.

## 2020-09-10 NOTE — Progress Notes (Signed)
[facility] PHYSICAL MEDICINE & REHABILITATION PROGRESS NOTE  Subjective/Complaints: Patient seen sitting up in bed this AM.  She staets she slept well overnight.  She is disappointed about news regarding delayed discharge. She was seen by Nephro yesterday, notes reviewed - HD held yesterday.  ROS: Denies CP, SOB, N/V/D  Objective: Vital Signs: Blood pressure (!) 143/82, pulse 83, temperature 98.4 F (36.9 C), temperature source Oral, resp. rate 18, height 5\' 4"  (1.626 m), weight 46.1 kg, SpO2 99 %. No results found. Recent Labs    09/08/20 0603 09/10/20 0453  WBC 4.3 4.2  HGB 9.9* 9.5*  HCT 30.4* 29.6*  PLT 190 171   Recent Labs    09/08/20 1033 09/10/20 0453  NA 125* 126*  K 3.9 3.9  CL 91* 97*  CO2 25 25  GLUCOSE 85 81  BUN 12 9  CREATININE 2.24* 1.89*  CALCIUM 8.2* 8.3*    Intake/Output Summary (Last 24 hours) at 09/10/2020 0926 Last data filed at 09/10/2020 0805 Gross per 24 hour  Intake 540 ml  Output 151 ml  Net 389 ml     Pressure Injury 09/06/20 Sacrum Medial Stage 2 -  Partial thickness loss of dermis presenting as a shallow open injury with a red, pink wound bed without slough. small opening- redness around (Active)  09/06/20 2001  Location: Sacrum  Location Orientation: Medial  Staging: Stage 2 -  Partial thickness loss of dermis presenting as a shallow open injury with a red, pink wound bed without slough.  Wound Description (Comments): small opening- redness around  Present on Admission:     Physical Exam: BP (!) 143/82 (BP Location: Right Arm)   Pulse 83   Temp 98.4 F (36.9 C) (Oral)   Resp 18   Ht 5\' 4"  (1.626 m)   Wt 46.1 kg   SpO2 99%   BMI 17.45 kg/m   Constitutional: No distress . Vital signs reviewed. HENT: Normocephalic.  Atraumatic. Eyes: EOMI. No discharge. Cardiovascular: No JVD.  RRR. Respiratory: Normal effort.  No stridor.  Bilateral clear to auscultation. GI: Non-distended.  BS +. Skin: Warm and dry.  Intact. Psych:  Normal mood.  Normal behavior. Musc: No edema in extremities.  No tenderness in extremities. Neuro: Alert Motor: LUE: 4/5 proximal distal, stable RUE: 4/5 proximal distal LLE: Hip flexion 4/5, knee extension 4/5, ankle dorsiflexion 4-/5, stable RLE: Hip flexion 4/5, knee extension 4/5, ankle dorsiflexion 3-3+/5  Assessment/Plan: 1. Functional deficits which require 3+ hours per day of interdisciplinary therapy in a comprehensive inpatient rehab setting.  Physiatrist is providing close team supervision and 24 hour management of active medical problems listed below.  Physiatrist and rehab team continue to assess barriers to discharge/monitor patient progress toward functional and medical goals   Care Tool:  Bathing    Body parts bathed by patient: Right arm,Left arm,Chest,Abdomen,Face,Right upper leg,Left upper leg   Body parts bathed by helper: Buttocks     Bathing assist Assist Level: Moderate Assistance - Patient 50 - 74%     Upper Body Dressing/Undressing Upper body dressing   What is the patient wearing?: Pull over shirt    Upper body assist Assist Level: Minimal Assistance - Patient > 75%    Lower Body Dressing/Undressing Lower body dressing      What is the patient wearing?: Pants     Lower body assist Assist for lower body dressing: Maximal Assistance - Patient 25 - 49%     Toileting Toileting    Toileting assist Assist  for toileting: Maximal Assistance - Patient 25 - 49%     Transfers Chair/bed transfer  Transfers assist     Chair/bed transfer assist level: 2 Helpers Chair/bed transfer assistive device: Programmer, multimedia   Ambulation assist   Ambulation activity did not occur: Safety/medical concerns  Assist level: Maximal Assistance - Patient 25 - 49% Assistive device: Parallel bars Max distance: 3'   Walk 10 feet activity   Assist  Walk 10 feet activity did not occur: Safety/medical concerns  Assist level: Moderate  Assistance - Patient - 50 - 74% Assistive device: Walker-rolling   Walk 50 feet activity   Assist Walk 50 feet with 2 turns activity did not occur: Safety/medical concerns         Walk 150 feet activity   Assist Walk 150 feet activity did not occur: Safety/medical concerns         Walk 10 feet on uneven surface  activity   Assist Walk 10 feet on uneven surfaces activity did not occur: Safety/medical concerns   Assist level: Moderate Assistance - Patient - 50 - 74% Assistive device: Aeronautical engineer Will patient use wheelchair at discharge?: Yes Type of Wheelchair: Manual    Wheelchair assist level: Supervision/Verbal cueing Max wheelchair distance: 40'    Wheelchair 50 feet with 2 turns activity    Assist    Wheelchair 50 feet with 2 turns activity did not occur: Safety/medical concerns   Assist Level: Supervision/Verbal cueing   Wheelchair 150 feet activity     Assist  Wheelchair 150 feet activity did not occur: Safety/medical concerns        Medical Problem List and Plan: 1.  Right hemiparesis and aphasia secondary to large left SDH s/p craniotomy  Continue CIR  Team conference today to discuss current and goals and coordination of care, home and environmental barriers, and discharge planning with nursing, case manager, and therapies. Please see conference note from today as well.  2.  Impaired mobility -DVT/anticoagulation:  Mechanical: Continue Sequential compression devices, below knee Bilateral lower extremities             -antiplatelet therapy: N/A due to bleed.  3. Chronic migraines/ Pain Management: Continue Fioricet 3-4 times a day on average. Added prn Fioricet q8H  Controlled on 5/11  Monitor with increased exertion 4. Mood: LCSW to follow for evaluation and support.  -antipsychotic agents: N/A 5. Neuropsych: This patient is not fully capable of making decisions on her own behalf. 6. Skin/Wound Care:  Monitor wound for healing.             -Family to provide meals from home as able.  -bilateral PRAFOs ordered but patient refuses to wear 7. Fluids/Electrolytes/Nutrition: Continue PD--does not need fluid restriction.              --has had chronic issues with variable intake as well as N/V likely due to gastroparesis.  8. Chronic Hypotension: Continue TEDs when out of bed             Vitals:   09/09/20 2256 09/10/20 0350  BP: 133/79 (!) 143/82  Pulse: 92 83  Resp:  18  Temp:  98.4 F (36.9 C)  SpO2:  99%   Relatively controlled on 5/11 9.  ESRD   Transitioned from PD to HD.  HD cath placed on 5/5 10.  Recurrent Seizures: Has been seizure free, continue Brivaracetam and Lacosamide bid.              --  Lacosamide was decreased on 03/31 to avoid sedative SE, decreased again on 4/28- appreciate neurology recommendations..  No breakthrough seizures since admission- 5/11 11. CAD/NSTEMI s/p PCI: Treated medically.   Off DAPT due to bleed   Continue Lipitor.   Metoprolol low-dose resumed  Heart rate controlled on 5/11 12. Acute on chronic anemia:   Hemoglobin 9.5 on 5/11  Continue to monitor 13. H/o Depression: Continue Celexa 14. Sick Euthyroid: Will need follow up as illness resolves.              --TSH- 5.80/Free T4-1.2 (03/27) 15. Gastroparesis w/chronic N/V/anorexia: Has been ongoing per records review.              --question failure of PD (husband reports no N/V with brief episode of HD in the past)  Continue Reglan 5 3 times daily, will trial weaning  Hesitant to continue increase medications due to #18  Improved overall 16.  Hyponatremia             Chronic hyponatremia with Na-130-133 range  Recs per nephro             --Continue Sodium bicarb to manage metabolic acidosis. 17.  Severe hypoalbuminemia  Supplement d/ced given nausea and vomiting- recommended nuts as source of protein. Her daughter has brought in.   5/7- nephrology giving albumin today.  18.  Prolonged  QTC  ECG reviewed, improved since last visit  Compazine and dose/frequency decreased 19.  Sleep disturbance- "never a good sleeper"  Cont trazodone and melatonin    Likely at baseline 20.  Hypokalemia  Potassium 3.9 on 5/11  Scheduled supplement DC'd, monitor.  21. Leukocytosis: Resolved  WBCs 4.3 on 5/11  Afebrile  No signs/symptoms of infection  Cont to monitor  LOS: 37 days A FACE TO FACE EVALUATION WAS PERFORMED  Shela Esses Lorie Phenix 09/10/2020, 9:26 AM

## 2020-09-10 NOTE — Progress Notes (Signed)
Occupational Therapy Session Note  Patient Details  Name: Amy [name] MRN: [id number] Date of Birth: 1950/08/14  Today's Date: 09/10/2020 OT Individual Time: 0903-1000 OT Individual Time Calculation (min): 57 min    Short Term Goals: Week 6:  OT Short Term Goal 1 (Week 6): Patient will complete SPT consistently with min A. OT Short Term Goal 2 (Week 6): Patient will complete toileting mod A. OT Short Term Goal 3 (Week 6): Patient will complete lower body dressing with min A.  Skilled Therapeutic Interventions/Progress Updates:    Pt received supine in bed, no pain reported, but not agreeable to OOB therapy session due to not feeling well and "being too early - I will get up later with other therapies." Therapeutic listening and encouragement to participate to improve endurance and functional independence in BADLs with good understanding, but still not willing for OOB activities. Wrapped BLEs with ace wrap; pt donned non-skid gripper socks max A. Pt agreeable to sit EOB for UB ADLS. Sup<>sit mod A using bed rails; max A to scoot EOB. Pt noted with R lateral lean; completed L lateral leans with reaching to improve. Maintained sitting balance min-mod A with verbal and tactile cues. Pt engaged in face washing activity set-up A EOB; UB dressing min A. Pt with noted fatigue and requesting to lay down. Sit<>stand max A in attempt to scoot to [facility]. In supine, pt completed UB conditioning/strengthening exercises in preparation for functional transfers and BADLs: pull aparts, rows, shoulder external rotation/flexion, and triceps activation for 10 reps each. Pt requesting to use bedpan instead of BSC to void despite increased encouragement to use BSC. Unable to void urine, but incontinent and continent of bowels. Dependent toileting for peri hygiene, but rolling to place bedpan at spvsn level. Ended session with PT session beginning and pt supine in bed.  Therapy Documentation Precautions:   Precautions Precautions: Fall Precaution Comments: seizure precautions Restrictions Weight Bearing Restrictions: No Other Position/Activity Restrictions: head incision Pain: Pain Assessment Pain Scale: 0-10 Pain Score: 0-No pain Pain Location: Head Pain Descriptors / Indicators: Aching Pain Intervention(s): Distraction   Therapy/Group: Individual Therapy  Mellissa Kohut 09/10/2020, 9:43 AM

## 2020-09-10 NOTE — Progress Notes (Signed)
Speech Language Pathology Daily Session Note  Patient Details  Name: Amy [name] MRN: [id number] Date of Birth: Sep 17, 1950  Today's Date: 09/10/2020 SLP Individual Time: 1115-1200 SLP Individual Time Calculation (min): 45 min  Short Term Goals: Week 5: SLP Short Term Goal 1 (Week 5): STG=LTG due to awaiting SNF placement  Skilled Therapeutic Interventions: Skilled SLP intervention focused on cognition. Overall improvement in processing speed and problem solving skills since last treated by this SLP. She answered problem solving questions of moderate complexity using calendar with events with  min A verbal cues. Mod A with patient being presented with options to recall of tasks completed in PT and OT session. Pt left seated upright in chair with call button within reach. Cont with therapy per plan of care.      Pain Pain Assessment Pain Scale: Faces Pain Score: 0-No pain  Therapy/Group: Individual Therapy  Darrol Poke Fritzie Prioleau 09/10/2020, 11:53 AM

## 2020-09-10 NOTE — Progress Notes (Incomplete)
Prafo boot refused

## 2020-09-10 NOTE — Patient Care Conference (Signed)
Inpatient RehabilitationTeam Conference and Plan of Care Update Date: 09/10/2020   Time: 11:33 AM    Patient Name: Amy [name]      Medical Record Number: [id number]  Date of Birth: 1950/10/14 Sex: Female         Room/Bed: 4W06C/4W06C-01 Payor Info: Payor: MEDICARE / Plan: MEDICARE PART A AND B / Product Type: *No Product type* /    Admit Date/Time:  08/04/2020  1:38 PM  Primary Diagnosis:  SDH (subdural hematoma) [facility])  Hospital Problems: Principal Problem:   SDH (subdural hematoma) ([place]) Active Problems:   Subdural hemorrhage following injury ([place])   Acute on chronic anemia   Postoperative pain   Sleep disturbance   Leukocytosis    Expected Discharge Date: Expected Discharge Date: 09/15/20  Team Members Present: Physician leading conference: Dr. Delice Lesch Care Coodinator Present: Dorien Chihuahua, RN, BSN, CRRN;Becky Dupree, LCSW Nurse Present: Dorien Chihuahua, RN PT Present: Magda Kiel, PT OT Present: Other (comment) Hulda Marin, OT) SLP Present: Other (comment) Gunnar Fusi, SLP) PPS Coordinator present : Gunnar Fusi, SLP     Current Status/Progress Goal Weekly Team Focus  Bowel/Bladder             Swallow/Nutrition/ Hydration             ADL's   UB bathing/dressing min A, LB bathing/dressing max A, toileting max/dep, SPT max A +2 at times and bed mobility mod A. Following HD cath placement, pt more fatigued and having difficulties with mobility.  min/mod A  OOB and activity tolerance, adl/transfer training, family education/reducing caregiver burden   Mobility   mod to max A for transfers, 3 steps fwd/3 back in parallel bars mod to max A, sitting balance S, w/c mobility 40-50' S, standing mod-max A during ADL's  min A overall  continued work on LE strength, balance, upright tolerance, gait, w/c mobility (decline since procedure for HD and pt NPO several days and missed therapy 2 days)   Communication             Safety/Cognition/ Behavioral Observations   Min A  Supervision A, downgrade mildly compex problem to min A 5/4  recall strategies, error awareness, attention and mildly complex problem solving   Pain             Skin               Discharge Planning:  [facility] can take pt Monday 5/16. OP-HD set up in [place] Sat and husband to set up transport for Sat [place] will transport. Will need COVID test on Sunday for transfer Monday   Team Discussion: Completed HD catheter placement and maintain PD catheter; oliguric. Encourage PRAFOs at HS due to heel cord tightening. Noted decline in function over the past week; and increased right side weakness with fatigue.  Patient on target to meet rehab goals: Currently min assist for upper body care and mod-max assist for lower body care. Encourage using BSC for toileting instead of patient's preferred method of bedpan  *See Care Plan and progress notes for long and short-term goals.   Revisions to Treatment Plan:  Downgraded goals for SLP to min assist for error awareness and complex problem solving Teaching Needs: Transfers, toileting, medication management, safety, etc   Current Barriers to Discharge: Decreased caregiver support, Home enviroment access/layout, Wound care, Hemodialysis and Behavior  Possible Resolutions to Barriers: Family education with spouse Recommended hired caregivers to assist spouse in the home/ SNF placement recommended  if changed over to HD from PD     Medical Summary Current Status: Right hemiparesis and aphasia secondary to large left SDH s/p craniotomy  Barriers to Discharge: Medical stability;Incontinence;Wound care;Hemodialysis   Possible Resolutions to [employer] Focus: Therapies, monitor for seizures, follow labs - Hb, Nephro recs for HD   Continued Need for Acute Rehabilitation Level of Care: The patient requires daily medical management by a physician with specialized training in physical medicine and rehabilitation  for the following reasons: Direction of a multidisciplinary physical rehabilitation program to maximize functional independence : Yes Medical management of patient stability for increased activity during participation in an intensive rehabilitation regime.: Yes Analysis of laboratory values and/or radiology reports with any subsequent need for medication adjustment and/or medical intervention. : Yes   I attest that I was present, lead the team conference, and concur with the assessment and plan of the team.   Dorien Chihuahua B 09/10/2020, 2:26 PM

## 2020-09-11 LAB — CBC
HCT: 32.5 % — ABNORMAL LOW (ref 36.0–46.0)
Hemoglobin: 10.6 g/dL — ABNORMAL LOW (ref 12.0–15.0)
MCH: 32 pg (ref 26.0–34.0)
MCHC: 32.6 g/dL (ref 30.0–36.0)
MCV: 98.2 fL (ref 80.0–100.0)
Platelets: 230 10*3/uL (ref 150–400)
RBC: 3.31 MIL/uL — ABNORMAL LOW (ref 3.87–5.11)
RDW: 15.4 % (ref 11.5–15.5)
WBC: 7.1 10*3/uL (ref 4.0–10.5)
nRBC: 0 % (ref 0.0–0.2)

## 2020-09-11 LAB — RENAL FUNCTION PANEL
Albumin: 1.7 g/dL — ABNORMAL LOW (ref 3.5–5.0)
Albumin: 1.5 g/dL — ABNORMAL LOW (ref 3.5–5.0)
Anion gap: 8 (ref 5–15)
Anion gap: 5 (ref 5–15)
BUN: 15 mg/dL (ref 8–23)
BUN: 16 mg/dL (ref 8–23)
CO2: 25 mmol/L (ref 22–32)
CO2: 25 mmol/L (ref 22–32)
Calcium: 8.4 mg/dL — ABNORMAL LOW (ref 8.9–10.3)
Calcium: 8.1 mg/dL — ABNORMAL LOW (ref 8.9–10.3)
Chloride: 92 mmol/L — ABNORMAL LOW (ref 98–111)
Chloride: 95 mmol/L — ABNORMAL LOW (ref 98–111)
Creatinine, Ser: 2.54 mg/dL — ABNORMAL HIGH (ref 0.44–1.00)
Creatinine, Ser: 2.65 mg/dL — ABNORMAL HIGH (ref 0.44–1.00)
GFR, Estimated: 20 mL/min — ABNORMAL LOW (ref >60)
GFR, Estimated: 19 mL/min — ABNORMAL LOW (ref >60)
Glucose, Bld: 87 mg/dL (ref 70–99)
Glucose, Bld: 121 mg/dL — ABNORMAL HIGH (ref 70–99)
Phosphorus: 3.1 mg/dL (ref 2.5–4.6)
Phosphorus: 2.9 mg/dL (ref 2.5–4.6)
Potassium: 3.7 mmol/L (ref 3.5–5.1)
Potassium: 3.7 mmol/L (ref 3.5–5.1)
Sodium: 125 mmol/L — ABNORMAL LOW (ref 135–145)
Sodium: 125 mmol/L — ABNORMAL LOW (ref 135–145)

## 2020-09-11 MED ORDER — SODIUM CHLORIDE 0.9 % IV SOLN: 100.0000 mL | INTRAVENOUS | Status: DC | PRN

## 2020-09-11 MED ORDER — ALTEPLASE 2 MG IJ SOLR: 2.0000 mg | Freq: Once | INTRAMUSCULAR | Status: DC | PRN

## 2020-09-11 MED ORDER — PENTAFLUOROPROP-TETRAFLUOROETH EX AERO: 1.0000 "application " | INHALATION_SPRAY | CUTANEOUS | Status: DC | PRN

## 2020-09-11 MED ORDER — HEPARIN SODIUM (PORCINE) 1000 UNIT/ML DIALYSIS: 1000.0000 [IU] | INTRAMUSCULAR | Status: DC | PRN

## 2020-09-11 MED ORDER — LIDOCAINE-PRILOCAINE 2.5-2.5 % EX CREA: 1.0000 "application " | TOPICAL_CREAM | CUTANEOUS | Status: DC | PRN

## 2020-09-11 MED ORDER — LIDOCAINE HCL (PF) 1 % IJ SOLN: 5.0000 mL | INTRAMUSCULAR | Status: DC | PRN

## 2020-09-11 NOTE — Progress Notes (Signed)
Renal Navigator faxed note by Renal PA/K. Stovall from weekend stating patient's tolerance of HD in recliner to [facility]. Patient is all set to start at [facility] in [place] on Tuesday, 09/16/20.  Navigator remains available for support and assistance as needed through patient's discharge.  Alphonzo Cruise, [place] Renal Navigator [phone]

## 2020-09-11 NOTE — Progress Notes (Addendum)
Amy [name] Progress Note   Subjective:   Seen in room. No new complaints. Feels "fine"  For dialysis today in recliner.    Objective Vitals:   09/10/20 1329 09/10/20 1944 09/11/20 0523 09/11/20 0903  BP: 136/78 (!) 128/94 (!) 151/83 (!) 152/87  Pulse: 86 91 79 67  Resp: 16 16 16    Temp: 98.1 F (36.7 C) 98.5 F (36.9 C) 98 F (36.7 C)   TempSrc: Oral     SpO2: 100% 99% 99%   Weight:   46.3 kg   Height:       Physical Exam General:Well appearing, frail woman, NAD Heart:RRR; no murmur Lungs:CTA anteriorly Abdomen:soft, non-tender, PD cath in place Extremities:No LE edema Dialysis Access:R IJ TDC, non-tender  Additional Objective Labs: Basic Metabolic Panel: Recent Labs  Lab 09/06/20 1223 09/08/20 1033 09/10/20 0453  NA 123* 125* 126*  K 4.0 3.9 3.9  CL 92* 91* 97*  CO2 24 25 25   GLUCOSE 93 85 81  BUN 14 12 9   CREATININE 2.60* 2.24* 1.89*  CALCIUM 7.9* 8.2* 8.3*  PHOS 3.8 2.9 2.7   Liver Function Tests: Recent Labs  Lab 09/06/20 1223 09/08/20 1033 09/10/20 0453  ALBUMIN 1.2* 1.7* 1.3*   CBC: Recent Labs  Lab 09/04/20 1812 09/06/20 1222 09/08/20 0603 09/10/20 0453  WBC 12.2* 8.3 4.3 4.2  HGB 11.8* 11.1* 9.9* 9.5*  HCT 34.4* 34.1* 30.4* 29.6*  MCV 96.1 101.8* 99.7 100.0  PLT 216 239 190 171   Medications:  . aspirin EC  81 mg Oral Daily  . atorvastatin  40 mg Oral Daily  . brivaracetam  75 mg Oral BID  . Chlorhexidine Gluconate Cloth  6 each Topical Daily  . darbepoetin (ARANESP) injection - DIALYSIS  60 mcg Intravenous Q Thu-HD  . docusate sodium  100 mg Oral BID  . gentamicin cream  1 application Topical Daily  . heparin  5,000 Units Subcutaneous Q8H  . kidney failure book   Does not apply Once  . lacosamide  50 mg Oral BID  . melatonin  5 mg Oral QHS  . metoCLOPramide  5 mg Oral TID AC  . metoprolol tartrate  6.25 mg Oral BID  . multivitamin  1 tablet Oral QHS  . pantoprazole  40 mg Oral Daily  .  triamcinolone cream   Topical TID    Dialysis Orders: [facility] followed by Dr. Candiss Norse ([place]). 4 exchanges overnight, 2000 cc and 1.5 hrs each -> transitioned to HD. Renal care manager arranging OP HD plans and PD cath flushes  Assessment/Plan:  1.ESRD: Previously on [facility] [place], f/b Dr Candiss Norse of [place].Bloody effluent seen on takedown 4/15and again 4/28 and 4/29. Not receiving heparin with PD.PD gram stain, cultures and cell count negative for infection.D/t plan for SNF, she was transitioned to HD. S/p TDC placement 5/5 with HD afterwards -> Was started on MWF schedule but [facility] 10:45 am seat time. Plans for discharge 5/16. Will continue on TTS schedule until discharge. Next HD 5/12.  2. Hypokalemia:chronic while on PD - improved on HD  3.HTN/volume:  BP stable. Significant overload on admit, now euvolemic. EDW will be around 43kg. 4.Fall w/ subdural hematoma: S/pSDH evacuation at [facility] on3/24[facility]22 5. Anemia of ESRD: Hgb 9.5. Getting Darbe 60 q week. Plan next dose with HD 5/12.  6.MBD: CorrCa mid 10's, Phos ok. No binder or VDRA, follow. 7.Nausea/ vomiting: Intermittent, using Reglan TID. 8. Malnutrition/ debility: Alb very low. Previously  had refused changing to HD, now agreeable since will be going to SNF.Has not tolerated protein supplements. 9.Multivessel CAD/HFrEF (25-30% LHC 06/2020):Per TCTSwasnotCABG candidate. UnderwentPCI to LAD. At [facility] repeat echo per family showed improved EF 55%. 10. Chronichyponatremia: Stable and euvolemic appearing. Off Na tabs. SSRI d/c'd  11. Metabolic acidosis: Stable, off bicarb. 12.HxSeizures-per primary team- vimpat was reduced 4/29 for side effects.  89 Debility: 2/2 Multiple med issues, most recently SDH recovery and NSTEMI w/ CAD and low EF. Readmitted to CIR on 08/04/20.Now planning discharge to SNFwithtransition to HD.  Amy Child PA-C [place]  Kidney [name] 09/11/2020,12:20 PM   Seen and examined independently.  Agree with note and exam as documented above by physician extender and as noted here.  Spoke with husband at bedside this AM.  PD catheter last flushed on Sat and I have asked that they flush this today with HD.  No labs. Energy level still low.   General adult female in bed in no acute distress HEENT normocephalic atraumatic extraocular movements intact sclera anicteric Neck supple trachea midline Lungs clear to auscultation bilaterally normal work of breathing at rest; on room air Heart S1S2 no rub Abdomen soft nontender nondistended; PD catheter intact; dressed Extremities no edema  Psych normal mood and affect Neuro - alert and oriented x 3 provides hx and follows commands Access: RIJ tunneled catheter  ESRD - on HD per new TTS schedule  Hyponatremia - she has gone an extra day between HD with switching schedules and I took her off of her SSRI; hopeful for improvement.  Renal panel in AM   PD catheter in situ - have requested PD catheter be flushed today   Anemia of CKD - on ESA  Claudia Desanctis, MD 09/11/2020  12:37 PM

## 2020-09-11 NOTE — Progress Notes (Signed)
Physical Therapy Session Note  Patient Details  Name: Amy [name] MRN: [id number] Date of Birth: Feb 01, 1951  Today's Date: 09/11/2020 PT Individual Time: 0915-1005 PT Individual Time Calculation (min): 50 min   Short Term Goals: PT Short Term Goals - Week 6 PT Short Term Goal 1 (Week 6): STG=LTG due to ELOS  Skilled Therapeutic Interventions/Progress Updates:    Patient initially having assist for bedpan and medication.  Patient in supine requesting to stay in the room as up frequently overnight calling for bed pan.  Patient supine for ace wraps donned total A.  Patient performed LE therex including ankle pumps, passive heel cord stretching with contract relax technique, ankle DF w/ yellow t-band x 10, heel slides, bridging with hip adductor squeeze & 5 sec hold, hip abduction hooklying with orange t-band, and hooklying marching x 10.  Supine to sit mod A with cues and time.  Patient initially min a for balance progressing to S and A for scooting to EOB.  Transfer via squat pivot to w/c with mod A.  PAtient then transferred to [facility] with stand step with RW and mod/max A w/ max cues for foot placement, hand placement and lifting/lowering assist.  Patient completed toileting with total A for hygiene and clothing management placing and pulling up brief.  Patient ambulated 4' with RW and mod to max A to EOB with A for R LE stability due to lateral ankle positioning and risk for rollover, flexed posture, pushing walker too far forward, and anterior LOB max A to recover.  Patient scooted up along EOB toward HOB with max A scoot pivot.  Sit to supine mod A and positioned with pillow under legs to float heels, and HOB elevated, bed alarm active and call bell in reach.  Therapy Documentation Precautions:  Precautions Precautions: Fall Precaution Comments: seizure precautions Restrictions Weight Bearing Restrictions: No Other Position/Activity Restrictions: head incision General: PT Amount of Missed  Time (min): 10 Minutes PT Missed Treatment Reason: Nursing care Pain: Pain Assessment Pain Scale: 0-10 Pain Score: 3    Therapy/Group: Individual Therapy  Reginia Naas  Magda Kiel, PT 09/11/2020, 9:06 AM

## 2020-09-11 NOTE — Progress Notes (Signed)
Patient ID: Amy [name], female   DOB: 11/25/1950, 70 y.o.   MRN: [id number] Have spoken with [facility] Admissions to inform plan for transfer Monday to their facility. She will reach out to husband to discuss hiring [facility] transport for Sat dialysis sessions and if he wants an aide to go with pt to OP-HD. Discussed that is up to husband and facility. Will need COVID test ordered on Sunday for results to be available Monday. Continue to follow and plan for Monday transfer.

## 2020-09-11 NOTE — Progress Notes (Signed)
[place] PHYSICAL MEDICINE & REHABILITATION PROGRESS NOTE  Subjective/Complaints: No complaints this morning.  Provided with list of foods to help with her headaches SBP high Husband at bedside  ROS: Denies CP, SOB, N/V/D  Objective: Vital Signs: Blood pressure (!) 152/87, pulse 67, temperature 98 F (36.7 C), resp. rate 16, height 5\' 4"  (1.626 m), weight 46.3 kg, SpO2 99 %. No results found. Recent Labs    09/10/20 0453  WBC 4.2  HGB 9.5*  HCT 29.6*  PLT 171   Recent Labs    09/10/20 0453  NA 126*  K 3.9  CL 97*  CO2 25  GLUCOSE 81  BUN 9  CREATININE 1.89*  CALCIUM 8.3*    Intake/Output Summary (Last 24 hours) at 09/11/2020 1247 Last data filed at 09/11/2020 3536 Gross per 24 hour  Intake 420 ml  Output 100 ml  Net 320 ml     Pressure Injury 09/06/20 Sacrum Medial Stage 2 -  Partial thickness loss of dermis presenting as a shallow open injury with a red, pink wound bed without slough. small opening- redness around (Active)  09/06/20 2001  Location: Sacrum  Location Orientation: Medial  Staging: Stage 2 -  Partial thickness loss of dermis presenting as a shallow open injury with a red, pink wound bed without slough.  Wound Description (Comments): small opening- redness around  Present on Admission:     Physical Exam: BP (!) 152/87   Pulse 67   Temp 98 F (36.7 C)   Resp 16   Ht 5\' 4"  (1.626 m)   Wt 46.3 kg   SpO2 99%   BMI 17.52 kg/m   Gen: no distress, normal appearing HEENT: oral mucosa pink and moist, NCAT Cardio: Reg rate Chest: normal effort, normal rate of breathing Abd: soft, non-distended Ext: no edema Psych: Normal mood.  Normal behavior. Musc: No edema in extremities.  No tenderness in extremities. Neuro: Alert Motor: LUE: 4/5 proximal distal, stable RUE: 4/5 proximal distal LLE: Hip flexion 4/5, knee extension 4/5, ankle dorsiflexion 4-/5, stable RLE: Hip flexion 4/5, knee extension 4/5, ankle dorsiflexion  3-3+/5  Assessment/Plan: 1. Functional deficits which require 3+ hours per day of interdisciplinary therapy in a comprehensive inpatient rehab setting.  Physiatrist is providing close team supervision and 24 hour management of active medical problems listed below.  Physiatrist and rehab team continue to assess barriers to discharge/monitor patient progress toward functional and medical goals   Care Tool:  Bathing    Body parts bathed by patient: Chest,Abdomen,Front perineal area,Face   Body parts bathed by helper: Buttocks     Bathing assist Assist Level: Moderate Assistance - Patient 50 - 74%     Upper Body Dressing/Undressing Upper body dressing   What is the patient wearing?: Pull over shirt    Upper body assist Assist Level: Minimal Assistance - Patient > 75%    Lower Body Dressing/Undressing Lower body dressing      What is the patient wearing?: Pants     Lower body assist Assist for lower body dressing: Total Assistance - Patient < 25%     Toileting Toileting    Toileting assist Assist for toileting: Total Assistance - Patient < 25%     Transfers Chair/bed transfer  Transfers assist     Chair/bed transfer assist level: Maximal Assistance - Patient 25 - 49% Chair/bed transfer assistive device: Armrests   Locomotion Ambulation   Ambulation assist   Ambulation activity did not occur: Safety/medical concerns  Assist level: Maximal  Assistance - Patient 25 - 49% Assistive device: Walker-rolling Max distance: 5'   Walk 10 feet activity   Assist  Walk 10 feet activity did not occur: Safety/medical concerns  Assist level: Moderate Assistance - Patient - 50 - 74% Assistive device: Walker-rolling   Walk 50 feet activity   Assist Walk 50 feet with 2 turns activity did not occur: Safety/medical concerns         Walk 150 feet activity   Assist Walk 150 feet activity did not occur: Safety/medical concerns         Walk 10 feet on  uneven surface  activity   Assist Walk 10 feet on uneven surfaces activity did not occur: Safety/medical concerns   Assist level: Moderate Assistance - Patient - 50 - 74% Assistive device: Aeronautical engineer Will patient use wheelchair at discharge?: Yes Type of Wheelchair: Manual    Wheelchair assist level: Supervision/Verbal cueing Max wheelchair distance: 50'    Wheelchair 50 feet with 2 turns activity    Assist    Wheelchair 50 feet with 2 turns activity did not occur: Safety/medical concerns   Assist Level: Supervision/Verbal cueing   Wheelchair 150 feet activity     Assist  Wheelchair 150 feet activity did not occur: Safety/medical concerns        Medical Problem List and Plan: 1.  Right hemiparesis and aphasia secondary to large left SDH s/p craniotomy  Continue CIR 2.  Impaired mobility -DVT/anticoagulation:  Mechanical: Continue Sequential compression devices, below knee Bilateral lower extremities             -antiplatelet therapy: N/A due to bleed.  3. Chronic migraines/ Pain Management: Continue Fioricet 3-4 times a day on average. Added prn Fioricet q8H  Controlled on 5/12  Monitor with increased exertion 4. Mood: LCSW to follow for evaluation and support.  -antipsychotic agents: N/A 5. Neuropsych: This patient is not fully capable of making decisions on her own behalf. 6. Skin/Wound Care: Monitor wound for healing.             -Family to provide meals from home as able.  -bilateral PRAFOs ordered but patient refuses to wear 7. Fluids/Electrolytes/Nutrition: Continue PD--does not need fluid restriction.              --has had chronic issues with variable intake as well as N/V likely due to gastroparesis.  8. Chronic Hypotension: Continue TEDs when out of bed             Vitals:   09/11/20 0523 09/11/20 0903  BP: (!) 151/83 (!) 152/87  Pulse: 79 67  Resp: 16   Temp: 98 F (36.7 C)   SpO2: 99%    Elevated 5/12,  continue to monitor.  9.  ESRD   Transitioned from PD to HD.  HD cath placed on 5/5 10.  Recurrent Seizures: Has been seizure free, continue Brivaracetam and Lacosamide bid.              --Lacosamide was decreased on 03/31 to avoid sedative SE, decreased again on 4/28- appreciate neurology recommendations..  No breakthrough seizures since admission- 5/11 11. CAD/NSTEMI s/p PCI: Treated medically.   Off DAPT due to bleed   Continue Lipitor.   Metoprolol low-dose resumed  Heart rate controlled on 5/12 12. Acute on chronic anemia:   Hemoglobin 9.5 on 5/11  Continue to monitor 13. H/o Depression: Continue Celexa 14. Sick Euthyroid: Will need follow up as illness resolves.              --  TSH- 5.80/Free T4-1.2 (03/27) 15. Gastroparesis w/chronic N/V/anorexia: Has been ongoing per records review.              --question failure of PD (husband reports no N/V with brief episode of HD in the past)  Continue Reglan 5 3 times daily, will trial weaning  Hesitant to continue increase medications due to #18  Improved overall 16.  Hyponatremia             Chronic hyponatremia with Na-130-133 range  Recs per nephro             --Continue Sodium bicarb to manage metabolic acidosis. 17.  Severe hypoalbuminemia  Supplement d/ced given nausea and vomiting- recommended nuts as source of protein. Her daughter has brought in.   5/7- nephrology giving albumin today.  18.  Prolonged QTC  ECG reviewed, improved since last visit  Compazine and dose/frequency decreased 19.  Sleep disturbance- "never a good sleeper"  Cont trazodone and melatonin    Likely at baseline 20.  Hypokalemia  Potassium 3.9 on 5/11  Scheduled supplement DC'd, monitor.  21. Leukocytosis: Resolved  WBCs 4.3 on 5/11  Afebrile  No signs/symptoms of infection  Cont to monitor  LOS: 38 days A FACE TO FACE EVALUATION WAS PERFORMED  Clide Deutscher Rafal Archuleta 09/11/2020, 12:47 PM

## 2020-09-11 NOTE — Progress Notes (Signed)
Speech Language Pathology Daily Session Note  Patient Details  Name: Amy [name] MRN: [id number] Date of Birth: 09-Dec-1950  Today's Date: 09/11/2020 SLP Individual Time: 1400-1440 SLP Individual Time Calculation (min): 40 min  Short Term Goals: Week 4: SLP Short Term Goal 1 (Week 4): STG=LTG due to short ELOS (5/3) SLP Short Term Goal 1 - Progress (Week 4): Not met  Skilled Therapeutic Interventions:   Patient seen for skilled ST session focusing on cognitive function goals. She was aware of planned discharge on Monday but stated she was "going home" even though she will be going to a SNF closer to home. She completed alternating attention task of finding items from list in multi-page calorie counting booklet. Initially, she required modA cues to locate category related to item (such as "Poultry" when needing to find chicken breast), however she improved to minA cues and was able to locate two items with supervision to [place]. Patient reported that she felt "dumb" because of her perceived performance on this task. SLP informed her that as he has not worked with her in at least over a week, she demonstrated improved attention and working memory when completing task. Patient continues to benefit from skilled SLP intervention to maximize cognitive function prior to discharge.  Pain Pain Assessment Pain Scale: 0-10 Pain Score: 0-No pain Faces Pain Scale: No hurt  Therapy/Group: Individual Therapy   Sonia Baller, MA, CCC-SLP Speech Therapy

## 2020-09-11 NOTE — Progress Notes (Signed)
Occupational Therapy Session Note  Patient Details  Name: Amy [name] MRN: [id number] Date of Birth: 10/29/1950  Today's Date: 09/11/2020 OT Individual Time: 1101-1159 OT Individual Time Calculation (min): 58 min    Short Term Goals: Week 6:  OT Short Term Goal 1 (Week 6): Patient will complete SPT consistently with min A. OT Short Term Goal 2 (Week 6): Patient will complete toileting mod A. OT Short Term Goal 3 (Week 6): Patient will complete lower body dressing with min A.  Skilled Therapeutic Interventions/Progress Updates:    Pt received supine in bed, no pain reported, and agreeable to OT session. Pt not wanting to get up to use BSC, but agreeable with encouragement. Sup>sit mod A with bedrails; mod A and extra time and cues to scoot to EOB. Stand pivot transfer max A bed>w/c<>BSC over toilet with increased cues for stepping with RLE, upright posture, hand placement, and to improve posterior lean. Pt on BSC for extended time to void; continent of bowels and bladder, but pt did not sense voiding of bowels. Pt performed peri hygiene seated CGA but was dependent to clean buttocks in stance with max A to maintain standing. LB dressing in sitting and stance max A to pull over hips. Pt completed grooming activities at sink (oral care, face washing) set up A. UB dressing min A; set up A to bathe abdomen/chest. Increased encouragement needed for pt to sit in recliner for lunch, but education provided for importance of OOB activity. Squat pivot transfer w/c>recliner and scooting back in seat max A. Completed 10 reps RLE exercise targeting external hip rotation, knee FLX>EXT, and ankle eversion to improve strength, ROM, and endurance in functional transfers and BADLs. Pt left sitting in recliner with alarm set, call bell in reach, and all needs met.   Therapy Documentation Precautions:  Precautions Precautions: Fall Precaution Comments: seizure precautions Restrictions Weight Bearing  Restrictions: No Other Position/Activity Restrictions: head incision Pain: Pain Assessment Pain Scale: 0-10 Pain Score: 0-No pain   Therapy/Group: Individual Therapy  Mellissa Kohut 09/11/2020, 12:37 PM

## 2020-09-11 NOTE — Progress Notes (Signed)
Occupational Therapy Session Note  Patient Details  Name: Amy [name] MRN: [id number] Date of Birth: 1950/10/01  Today's Date: 09/11/2020 OT Individual Time: 1315-1355 OT Individual Time Calculation (min): 40 min    Short Term Goals: Week 4:  OT Short Term Goal 1 (Week 4): Patient will complete bed mobility with min A OT Short Term Goal 1 - Progress (Week 4): Met OT Short Term Goal 2 (Week 4): patient will complete toileting with mod A OT Short Term Goal 2 - Progress (Week 4): Progressing toward goal OT Short Term Goal 3 (Week 4): patient will complete sit to stand and SPT with RW consistent min/mod A OT Short Term Goal 3 - Progress (Week 4): Progressing toward goal Week 5:  OT Short Term Goal 1 (Week 5): Patient will complete SPT with consistent min A OT Short Term Goal 1 - Progress (Week 5): Progressing toward goal OT Short Term Goal 2 (Week 5): Patient will complete toileting with mod A OT Short Term Goal 2 - Progress (Week 5): Progressing toward goal OT Short Term Goal 3 (Week 5): patient will complete lower body dressing with min A OT Short Term Goal 3 - Progress (Week 5): Progressing toward goal Week 6:  OT Short Term Goal 1 (Week 6): Patient will complete SPT consistently with min A. OT Short Term Goal 2 (Week 6): Patient will complete toileting mod A. OT Short Term Goal 3 (Week 6): Patient will complete lower body dressing with min A.  Skilled Therapeutic Interventions/Progress Updates:    Pt greeted at times of session sitting up in recliner agreeable to additional OT session that was not scheduled, pleasant and agreeable. Min/Mod cues throughout session to attend to tasks and problem solve. Focus of session on core strength and sitting balance with 3x10-12 reps for forward reaches, chest press push/pulls. Reciprocal scooting to edge of seat x2 with Min A to facilitate and sit <> stands x2 trials with Mod A for removal and replacement of cushion for optimal firm seating surface  during core strengthening. Pt needing rest breaks and cues for attending to task. Pt repositioned with pillows for lateral supports and under BLEs. Alarm on call bell in reach.   Therapy Documentation Precautions:  Precautions Precautions: Fall Precaution Comments: seizure precautions Restrictions Weight Bearing Restrictions: No Other Position/Activity Restrictions: head incision     Therapy/Group: Individual Therapy  Viona Gilmore 09/11/2020, 2:48 PM

## 2020-09-12 DIAGNOSIS — S065X9A Traumatic subdural hemorrhage with loss of consciousness of unspecified duration, initial encounter: Secondary | ICD-10-CM | POA: Diagnosis not present

## 2020-09-12 DIAGNOSIS — G8918 Other acute postprocedural pain: Secondary | ICD-10-CM | POA: Diagnosis not present

## 2020-09-12 DIAGNOSIS — G479 Sleep disorder, unspecified: Secondary | ICD-10-CM | POA: Diagnosis not present

## 2020-09-12 DIAGNOSIS — D649 Anemia, unspecified: Secondary | ICD-10-CM | POA: Diagnosis not present

## 2020-09-12 LAB — RENAL FUNCTION PANEL
Albumin: 1.3 g/dL — ABNORMAL LOW (ref 3.5–5.0)
Anion gap: 4 — ABNORMAL LOW (ref 5–15)
BUN: 7 mg/dL — ABNORMAL LOW (ref 8–23)
CO2: 26 mmol/L (ref 22–32)
Calcium: 7.8 mg/dL — ABNORMAL LOW (ref 8.9–10.3)
Chloride: 97 mmol/L — ABNORMAL LOW (ref 98–111)
Creatinine, Ser: 1.52 mg/dL — ABNORMAL HIGH (ref 0.44–1.00)
GFR, Estimated: 36 mL/min — ABNORMAL LOW (ref >60)
Glucose, Bld: 83 mg/dL (ref 70–99)
Phosphorus: 2.2 mg/dL — ABNORMAL LOW (ref 2.5–4.6)
Potassium: 3.5 mmol/L (ref 3.5–5.1)
Sodium: 127 mmol/L — ABNORMAL LOW (ref 135–145)

## 2020-09-12 MED ORDER — CAMPHOR-MENTHOL 0.5-0.5 % EX LOTN: TOPICAL_LOTION | CUTANEOUS | 0 refills | Status: DC | PRN

## 2020-09-12 MED ORDER — LIDOCAINE HCL (PF) 1 % IJ SOLN
5.0000 mL | INTRAMUSCULAR | Status: DC | PRN
  Filled 2020-09-12: qty 5

## 2020-09-12 MED ORDER — MELATONIN 5 MG PO TABS: 5.0000 mg | ORAL_TABLET | Freq: Every evening | ORAL | 0 refills | Status: DC | PRN

## 2020-09-12 MED ORDER — HEPARIN SODIUM (PORCINE) 1000 UNIT/ML DIALYSIS
1000.0000 [IU] | INTRAMUSCULAR | Status: DC | PRN
  Filled 2020-09-12: qty 1

## 2020-09-12 MED ORDER — TRAZODONE HCL 50 MG PO TABS: 25.0000 mg | ORAL_TABLET | Freq: Every evening | ORAL | Status: DC | PRN

## 2020-09-12 MED ORDER — METOCLOPRAMIDE HCL 5 MG PO TABS: 5.0000 mg | ORAL_TABLET | Freq: Three times a day (TID) | ORAL | Status: DC

## 2020-09-12 MED ORDER — ATORVASTATIN CALCIUM 40 MG PO TABS: 40.0000 mg | ORAL_TABLET | Freq: Every day | ORAL | Status: DC

## 2020-09-12 MED ORDER — TRIAMCINOLONE ACETONIDE 0.1 % EX CREA: TOPICAL_CREAM | Freq: Three times a day (TID) | CUTANEOUS | 0 refills | Status: DC

## 2020-09-12 MED ORDER — RENA-VITE PO TABS: 1.0000 | ORAL_TABLET | Freq: Every day | ORAL | 0 refills | Status: DC

## 2020-09-12 MED ORDER — METOCLOPRAMIDE HCL 5 MG PO TABS
5.0000 mg | ORAL_TABLET | Freq: Every day | ORAL | Status: DC
  Administered 2020-09-13 – 2020-09-15 (×3): 5 mg via ORAL
  Filled 2020-09-12 (×3): qty 1

## 2020-09-12 MED ORDER — DOCUSATE SODIUM 100 MG PO CAPS: 100.0000 mg | ORAL_CAPSULE | Freq: Two times a day (BID) | ORAL | 0 refills | Status: DC

## 2020-09-12 MED ORDER — LACOSAMIDE 50 MG PO TABS: 50.0000 mg | ORAL_TABLET | Freq: Two times a day (BID) | ORAL | Status: DC

## 2020-09-12 MED ORDER — METHOCARBAMOL 500 MG PO TABS: 500.0000 mg | ORAL_TABLET | Freq: Four times a day (QID) | ORAL | Status: DC | PRN

## 2020-09-12 MED ORDER — ALTEPLASE 2 MG IJ SOLR: 2.0000 mg | Freq: Once | INTRAMUSCULAR | Status: DC | PRN

## 2020-09-12 MED ORDER — SODIUM CHLORIDE 0.9 % IV SOLN: 100.0000 mL | INTRAVENOUS | Status: DC | PRN

## 2020-09-12 MED ORDER — BRIVARACETAM 25 MG PO TABS: 75.0000 mg | ORAL_TABLET | Freq: Two times a day (BID) | ORAL | Status: DC

## 2020-09-12 MED ORDER — BUTALBITAL-APAP-CAFFEINE 50-325-40 MG PO TABS: 1.0000 | ORAL_TABLET | Freq: Three times a day (TID) | ORAL | 0 refills | Status: DC | PRN

## 2020-09-12 MED ORDER — LIDOCAINE-PRILOCAINE 2.5-2.5 % EX CREA
1.0000 "application " | TOPICAL_CREAM | CUTANEOUS | Status: DC | PRN
  Filled 2020-09-12: qty 5

## 2020-09-12 MED ORDER — METOPROLOL TARTRATE 25 MG/10 ML ORAL SUSPENSION: 6.2500 mg | Freq: Two times a day (BID) | ORAL | Status: DC

## 2020-09-12 MED ORDER — ACETAMINOPHEN 325 MG PO TABS: 325.0000 mg | ORAL_TABLET | ORAL | Status: AC | PRN

## 2020-09-12 MED ORDER — CHLORHEXIDINE GLUCONATE CLOTH 2 % EX PADS: 6.0000 | MEDICATED_PAD | Freq: Every day | CUTANEOUS | Status: DC

## 2020-09-12 MED ORDER — PENTAFLUOROPROP-TETRAFLUOROETH EX AERO: 1.0000 "application " | INHALATION_SPRAY | CUTANEOUS | Status: DC | PRN

## 2020-09-12 MED ORDER — MELATONIN 5 MG PO TABS: 5.0000 mg | ORAL_TABLET | Freq: Every day | ORAL | 0 refills | Status: DC

## 2020-09-12 NOTE — Progress Notes (Signed)
Speech Language Pathology Daily Session Note  Patient Details  Name: Amy [name] MRN: [id number] Date of Birth: 04-08-1951  Today's Date: 09/12/2020 SLP Individual Time: 8372-9021 SLP Individual Time Calculation (min): 40 min  Short Term Goals: Week 1: SLP Short Term Goal 1 (Week 1): Pt will demonstrate sustained attention in functional tasks in 10 minue intervals with min A verbal cues for redirection. SLP Short Term Goal 1 - Progress (Week 1): Met SLP Short Term Goal 2 (Week 1): Pt will demonstrate mildly complex problem solving skills in functional tasks with min A verbal cues. SLP Short Term Goal 2 - Progress (Week 1): Not met SLP Short Term Goal 3 (Week 1): Pt will demonstrate self-monitor and self-correction in functional errors with min A verbal cues. SLP Short Term Goal 3 - Progress (Week 1): Not met SLP Short Term Goal 4 (Week 1): Pt will demonstrate recall of daily, novel information with supervision A verbal cues for visual aids. SLP Short Term Goal 4 - Progress (Week 1): Not met  Skilled Therapeutic Interventions:  Patient seen for skilled ST session focusing on cognitive goals. Patient was oriented to day of week and is aware of upcoming discharge on Monday. She did not recall having dialysis previous date and did not recall type of activity that she and this SLP worked on previous date. When cued to look at discharge date on calendar, she was able to determine today's date with minA. SLP reviewed with patient new dialysis schedule and assisted with writing this down for patient to refer to as her memory continues to be impaired. While reviewing patient's therapy schedule, she was able to recall some therapy tasks she has been completing with min-modA cues. Patient currently is planned for discharge to [facility] Monday 5/16 and will benefit from skilled SLP intervention until discharge as well as at next venue of care.  Pain Pain Assessment Pain Scale: 0-10 Pain Score: 0-No  pain  Therapy/Group: Individual Therapy  Sonia Baller, MA, CCC-SLP Speech Therapy

## 2020-09-12 NOTE — Progress Notes (Addendum)
[facility] Progress Note   Subjective:     Ms. Mccuen was seen and examined today at bedside. No complaints at this time. Denies SOB, CP, ABD pain, N/V/D. Last HD 5/12 in recliner: tolerated UF 1.5L. Plan for HD on 09/13/20. Spoke Terri Piedra LCSW today-Anticipate d/c to SNF on Monday 09/15/20 and to start HD at [facility]) on Tuesday 09/16/20.  Objective Vitals:   09/11/20 1924 09/12/20 0433 09/12/20 0810 09/12/20 1319  BP: 124/86 119/68 128/69 131/73  Pulse: 97 95 92 87  Resp: 16 18  16   Temp: 97.9 F (36.6 C) 98.2 F (36.8 C)  98.2 F (36.8 C)  TempSrc:      SpO2: 100% 97%  100%  Weight:  45.8 kg    Height:       Physical Exam General: Well appearing; comfortable; no acute respiratory distress Heart: Normal S1 and S2; No murmurs, gallops, or friction rub Lungs: Clear throughout; No wheezing, rales, or rhonchi Abdomen: Soft, non-tender; PD catheter in place Extremities: No edema bilateral lower extremities Dialysis Access: R [facility]  Filed Weights   09/10/20 0600 09/11/20 0523 09/12/20 0433  Weight: 46.1 kg 46.3 kg 45.8 kg    Intake/Output Summary (Last 24 hours) at 09/12/2020 1400 Last data filed at 09/12/2020 0439 Gross per 24 hour  Intake 180 ml  Output 1500 ml  Net -1320 ml    Additional Objective Labs: Basic Metabolic Panel: Recent Labs  Lab 09/11/20 1137 09/11/20 1406 09/12/20 0546  NA 125* 125* 127*  K 3.7 3.7 3.5  CL 92* 95* 97*  CO2 25 25 26   GLUCOSE 87 121* 83  BUN 15 16 7*  CREATININE 2.54* 2.65* 1.52*  CALCIUM 8.4* 8.1* 7.8*  PHOS 3.1 2.9 2.2*   Liver Function Tests: Recent Labs  Lab 09/11/20 1137 09/11/20 1406 09/12/20 0546  ALBUMIN 1.7* 1.5* 1.3*   No results for input(s): LIPASE, AMYLASE in the last 168 hours. CBC: Recent Labs  Lab 09/06/20 1222 09/08/20 0603 09/10/20 0453 09/11/20 1406  WBC 8.3 4.3 4.2 7.1  HGB 11.1* 9.9* 9.5* 10.6*  HCT 34.1* 30.4* 29.6* 32.5*  MCV 101.8* 99.7 100.0 98.2  PLT  239 190 171 230   Blood Culture    Component Value Date/Time   SDES FLUID 08/28/2020 1007   SPECREQUEST PERITONEAL DIALYSIS 08/28/2020 1007   CULT  08/28/2020 1007    NO GROWTH 3 DAYS Performed at [facility], [place] [address], [place], [place] [address]    REPTSTATUS 08/31/2020 FINAL 08/28/2020 1007    Cardiac Enzymes: No results for input(s): CKTOTAL, CKMB, CKMBINDEX, TROPONINI in the last 168 hours. CBG: No results for input(s): GLUCAP in the last 168 hours. Iron Studies: No results for input(s): IRON, TIBC, TRANSFERRIN, FERRITIN in the last 72 hours. Lab Results  Component Value Date   INR 1.1 09/02/2020   INR 1.1 06/19/2020   INR 1.2 04/04/2019   Studies/Results: No results found.  Medications:  . aspirin EC  81 mg Oral Daily  . atorvastatin  40 mg Oral Daily  . brivaracetam  75 mg Oral BID  . Chlorhexidine Gluconate Cloth  6 each Topical Daily  . darbepoetin (ARANESP) injection - DIALYSIS  60 mcg Intravenous Q Thu-HD  . docusate sodium  100 mg Oral BID  . gentamicin cream  1 application Topical Daily  . heparin  5,000 Units Subcutaneous Q8H  . kidney failure book   Does not apply Once  . lacosamide  50  mg Oral BID  . melatonin  5 mg Oral QHS  . [START ON 09/13/2020] metoCLOPramide  5 mg Oral QPC breakfast  . metoprolol tartrate  6.25 mg Oral BID  . multivitamin  1 tablet Oral QHS  . pantoprazole  40 mg Oral Daily  . triamcinolone cream   Topical TID    Dialysis Orders: [place] followed by Dr. Candiss Norse ([place]). 4 exchanges overnight, 2000 cc and 1.5 hrs each-> transitioned to HD. Renal care manager arranging OP HD plans and PD cath flushes. Please note: Last PD catheter flush inpatient was completed on 09/11/20.  Assessment/Plan: 1.ESRD: Previously on [facility] [place], f/b Dr Candiss Norse of [place].Bloody effluent seen on takedown 4/15and again 4/28 and 4/29. Not receiving heparin with PD.PD gram stain, cultures and cell count negative  forinfection.D/t plan for SNF, she was transitioned to HD. S/p TDC placement 5/5 with HD afterwards -> Was started on MWF schedule but nowhas outpatient spot [facility] 10:45 am seat time. Plans for discharge 5/16. Will continue on TTS schedule until discharge. Next HD 5/14-ordered to ensure patient is in the recliner. HD RN to coordinate this with unit RN. 2. Hypokalemia:chronic while on PD - improved on HD. K+ now 3.5-Monitor. 3.HTN/volume:  BP stable. Significant overload on admit, now euvolemic. EDW will be around 43kg. 4.Fall w/ subdural hematoma: S/pSDH evacuation at [facility] on3/24/22 5. Anemia of ESRD: Hgb now 10.6. Getting Darbe 60 q week. Doesn't appear dose was given on 5/12-will schedule for dose to be given tomorrow 09/13/20 with HD. 6.MBD: CorrCa 9.96, Phos low at 2.2. No binder or VDRA, follow. 7.Nausea/ vomiting: Intermittent, using Reglan TID. 8. Malnutrition/ debility: Alb very low. Previously had refused changing to HD, now agreeable since will be going to SNF.Has not tolerated protein supplements. 9.Multivessel CAD/HFrEF (25-30% LHC 06/2020):Per TCTSwasnotCABG candidate. UnderwentPCI to LAD. At [facility] per family showed improved EF 55%. 10. Chronichyponatremia: Stable and euvolemic appearing. Off Na tabs. SSRI d/c'd. Pt has chronically low Na+ levels, reset osmostat most likely. Not vol related. Would not treat unless Na< 125, and then would give NaCl tabs 1-2 gm bid for goal Na+ > 128- 135 range.  11. Metabolic acidosis: stable, off bicarb tabs. Bicarb levels have normalized due to hemodialysis, bicarb tabs have been dc'd.  12.HxSeizures-per primary team- vimpat was reduced 4/29 for side effects.  34 Debility: 2/2 Multiple med issues, most recently SDH recovery and NSTEMI w/ CAD and low EF. Readmitted to CIR on 08/04/20.Now planning discharge to SNFwithtransition to HD. Tobie Poet, NP [facility] 09/12/2020,2:00 PM  LOS:  39 days   Pt seen, examined and agree w assess/plan as above with additions as indicated.  [facility] 09/12/2020, 3:15 PM

## 2020-09-12 NOTE — Discharge Summary (Addendum)
Physician Discharge Summary  Patient ID: Amy [name] MRN: [id number] DOB/AGE: 72/30/1952 72 y.o.  Admit date: 08/04/2020 Discharge date: 09/15/2020  Discharge Diagnoses:  Principal Problem:   SDH (subdural hematoma) (HCC) Active Problems:   Chronic abdominal pain   Hyponatremia   Chronic headaches   Anemia of chronic disease   Prolonged Q-T interval on ECG   Subdural hemorrhage following injury ([place])   Acute on chronic anemia   Sleep disturbance   CAD (coronary artery disease) of artery bypass graft   History of nausea and vomiting   Discharged Condition: stable  Significant Diagnostic Studies: IR Fluoro Guide CV Line Right  Result Date: 09/04/2020 INDICATION: 70 year old female with end-stage renal disease requiring central venous access for hemodialysis. EXAM: TUNNELED CENTRAL VENOUS HEMODIALYSIS CATHETER PLACEMENT WITH ULTRASOUND AND FLUOROSCOPIC GUIDANCE MEDICATIONS: Vancomycin 1 gm IV . The antibiotic was given in an appropriate time interval prior to skin puncture. ANESTHESIA/SEDATION: Moderate (conscious) sedation was employed during this procedure. A total of Versed 1 mg and Fentanyl 25 mcg was administered intravenously. Moderate Sedation Time: 13 minutes. The patient's level of consciousness and vital signs were monitored continuously by radiology nursing throughout the procedure under my direct supervision. FLUOROSCOPY TIME:  0 minutes 6 seconds (1 mGy). COMPLICATIONS: None immediate. PROCEDURE: Informed written consent was obtained from the patient after a discussion of the risks, benefits, and alternatives to treatment. Questions regarding the procedure were encouraged and answered. The right neck and chest were prepped with chlorhexidine in a sterile fashion, and a sterile drape was applied covering the operative field. Maximum barrier sterile technique with sterile gowns and gloves were used for the procedure. A timeout was performed prior to the initiation of the procedure.  After creating a small venotomy incision, a 21 gauge micropuncture kit was utilized to access the internal jugular vein. Real-time ultrasound guidance was utilized for vascular access including the acquisition of a permanent ultrasound image documenting patency of the accessed vessel. A Rosen wire was advanced to the level of the IVC and the micropuncture sheath was exchanged for an 8 Fr dilator. A 14.5 French tunneled hemodialysis catheter measuring 19 cm from tip to cuff was tunneled in a retrograde fashion from the anterior chest wall to the venotomy incision. Serial dilation was then performed an a peel-away sheath was placed. The catheter was then placed through the peel-away sheath with the catheter tip ultimately positioned within the right atrium. Final catheter positioning was confirmed and documented with a spot radiographic image. The catheter aspirates and flushes normally. The catheter was flushed with appropriate volume heparin dwells. The catheter exit site was secured with a 0-Silk retention suture. The venotomy incision was closed with Dermabond. Sterile dressings were applied. The patient tolerated the procedure well without immediate post procedural complication. IMPRESSION: Successful placement of 19 cm tip to cuff tunneled hemodialysis catheter via the right internal jugular vein with catheter tip terminating within the right atrium. The catheter is ready for immediate use. Ruthann Cancer, MD [facility] Radiology Electronically Signed   By: Ruthann Cancer MD   On: 09/04/2020 11:10   IR US Guide Vasc Access Right  Result Date: 09/04/2020 INDICATION: 70 year old female with end-stage renal disease requiring central venous access for hemodialysis. EXAM: TUNNELED CENTRAL VENOUS HEMODIALYSIS CATHETER PLACEMENT WITH ULTRASOUND AND FLUOROSCOPIC GUIDANCE MEDICATIONS: Vancomycin 1 gm IV . The antibiotic was given in an appropriate time interval prior to  skin puncture. ANESTHESIA/SEDATION: Moderate (conscious) sedation was employed during this procedure. A  total of Versed 1 mg and Fentanyl 25 mcg was administered intravenously. Moderate Sedation Time: 13 minutes. The patient's level of consciousness and vital signs were monitored continuously by radiology nursing throughout the procedure under my direct supervision. FLUOROSCOPY TIME:  0 minutes 6 seconds (1 mGy). COMPLICATIONS: None immediate. PROCEDURE: Informed written consent was obtained from the patient after a discussion of the risks, benefits, and alternatives to treatment. Questions regarding the procedure were encouraged and answered. The right neck and chest were prepped with chlorhexidine in a sterile fashion, and a sterile drape was applied covering the operative field. Maximum barrier sterile technique with sterile gowns and gloves were used for the procedure. A timeout was performed prior to the initiation of the procedure. After creating a small venotomy incision, a 21 gauge micropuncture kit was utilized to access the internal jugular vein. Real-time ultrasound guidance was utilized for vascular access including the acquisition of a permanent ultrasound image documenting patency of the accessed vessel. A Rosen wire was advanced to the level of the IVC and the micropuncture sheath was exchanged for an 8 Fr dilator. A 14.5 French tunneled hemodialysis catheter measuring 19 cm from tip to cuff was tunneled in a retrograde fashion from the anterior chest wall to the venotomy incision. Serial dilation was then performed an a peel-away sheath was placed. The catheter was then placed through the peel-away sheath with the catheter tip ultimately positioned within the right atrium. Final catheter positioning was confirmed and documented with a spot radiographic image. The catheter aspirates and flushes normally. The catheter was flushed with appropriate volume heparin dwells. The catheter exit site was  secured with a 0-Silk retention suture. The venotomy incision was closed with Dermabond. Sterile dressings were applied. The patient tolerated the procedure well without immediate post procedural complication. IMPRESSION: Successful placement of 19 cm tip to cuff tunneled hemodialysis catheter via the right internal jugular vein with catheter tip terminating within the right atrium. The catheter is ready for immediate use. Ruthann Cancer, MD [facility] Radiology Electronically Signed   By: Ruthann Cancer MD   On: 09/04/2020 11:10   EEG adult  Result Date: 08/27/2020 [name], MD     08/27/2020  6:57 PM Patient Name: Amy [name] MRN: [id number] Epilepsy Attending: [name] Referring Physician/Provider: Dr Zeb Comfort Date: 08/27/2020 Duration: 23.24 mins Patient history: Level of alertness: Awake AEDs during EEG study: BRV, LCM Technical aspects: This EEG study was done with scalp electrodes positioned according to the 10-20 International system of electrode placement. Electrical activity was acquired at a sampling rate of 500Hz  and reviewed with a high frequency filter of 70Hz  and a low frequency filter of 1Hz . EEG data were recorded continuously and digitally stored. Description: The posterior dominant rhythm consists of 8-9 Hz activity of moderate voltage (25-35 uV) seen predominantly in posterior head regions, symmetric and reactive to eye opening and eye closing. EEG showed intermittent 3 to 6 Hz theta-delta slowing in left temporal region.   Hyperventilation and photic stimulation were not performed.   ABNORMALITY - Intermittent slow, left temporal region IMPRESSION: This study is suggestive of cortical dysfunction arising from left temporal region, nonspecific etiology but likely secondary to underlying SDH. No seizures or epileptiform discharges were seen throughout the recording. [place]:  Basic Metabolic  Panel: Recent Labs  Lab 09/08/20 1033 09/10/20 0453 09/11/20 1137 09/11/20 1406 09/12/20 0546 09/13/20 1511  NA 125* 126* 125* 125* 127*  124*  K 3.9 3.9 3.7 3.7 3.5 3.3*  CL 91* 97* 92* 95* 97* 94*  CO2 25 25 25 25 26 25   GLUCOSE 85 81 87 121* 83 133*  BUN 12 9 15 16  7* 14  CREATININE 2.24* 1.89* 2.54* 2.65* 1.52* 2.58*  CALCIUM 8.2* 8.3* 8.4* 8.1* 7.8* 8.1*  PHOS 2.9 2.7 3.1 2.9 2.2* 2.4*    CBC: Recent Labs  Lab 09/10/20 0453 09/11/20 1406 09/13/20 1511  WBC 4.2 7.1 8.0  HGB 9.5* 10.6* 10.2*  HCT 29.6* 32.5* 30.8*  MCV 100.0 98.2 98.1  PLT 171 230 221    CBG: No results for input(s): GLUCAP in the last 168 hours.  Brief HPI:   TAEGAN HAIDER is a 70 y.o. female with history of ESRD-on PD, chronic hyponatremia, chronic headaches, multiple admissions most recent for C. difficile colitis complicated by NSTEMI s/p PCI with DAPT, chronic issues with N/3 resulting in debility and CIR stay.  She was discharged to home on 07/24/2020 with reports of mild fall that afternoon followed by obtundation later that evening.  She was evaluated by [facility] on 03/24 evening and found to have acute on chronic left SDH with near complete effacement of left ventricle and 10 mm midline shift.  She was transferred to [facility] and underwent partial craniotomy for evacuation of SDH and postop had worsening of aphasia with 2 subclinical seizures.   Antiepileptics adjusted with resolution of seizures.  Hospital course also significant for issues with chronic hyponatremia as well as ongoing hypotension.  Due to concerns of hypotension due to sepsis as well as leukocytosis, sepsis work-up initiated and she was started on antibiotics due to concerns of UTI.   Fibrin was reported in PD solution therefore heparin was added after clearance from neurosurgery and ASA resumed.  Therapy was initiated she was noted to have deficits in mobility and ADLs.  CIR was recommended due to functional decline.   Hospital Course:  LASHANNA ANGELO was admitted to rehab 08/04/2020 for inpatient therapies to consist of PT, ST and OT at least three hours five days a week. Past admission physiatrist, therapy team and rehab RN have worked together to provide customized collaborative inpatient rehab. She continued to have bouts of nausea with vomiting therefore reglan was resumed with resolution of vomiting.  Family was encouraged to provide food from home as able.  Protein supplements were added to help promote healing but due to concerns of this causing acute on chronic nausea, this was discontinued. She was noted to have significant edema BUE/BLE at admission and volume overload has resolved with adjustment of PD.   Her blood pressures were monitored on TID basis and Ace wraps (TEDs too loose) used on BLE to help with BP support. She was maintained on Celexa for mood stabilization.  Chronic hyponatremia has been monitored with labs on hemodialysis days and sodium has been ranging from 123-126 range and she has treated with NaCl tabs briefly (last dose 04/25) while on PD.  Nephrology felt that hyponatremia was not volume related and no plans to resume this at current time per discussion with Dr. Jonnie Finner.  Intermittent hypokalemia has been managed with HD.   She reported lower abdominal tenderness therefore UA was done which was negative for infection. PD gram stain, culture and cell count were ordered 04/13 as well as 04/28 and have all been negative for infection.   She has been making slow progress and continues to require considerable assistance. Family is unable to  provide care needed and elected on SNF for progressive therapy. Tunneled HD catheter was placed by Dr Serafina Royals on 05/05 and she was transitioned to HD. Bicarb levels have normalized due to HD. Her husband has expressed concerns that AEDs were affecting cognitive status as well as affecting her energy levels.  Dr. Hortense Ramal  was consulted for input and felt that patient's risk of seizure  recurrence was high but as she had been seizure free for a month,  Vimpat was reduced to to 50 mg twice daily on 04/27 and EEG ordered for work up.  Latter showed cortical dysfunction arising from left temporal region and felt to be nonspecific in nature but likely secondary to SDH.  She has continued to be seizure-free on this dose.   Anemia has been monitored and H/H is improving on weekly dose of aranesp--last dose on 05/14. GI symptoms are improving overall and reglan has been tapered to once daily and compazine was decreased due to prolonged QTC. Chronic headaches have been managed with use of Fioricet daily. Stage II partial thickness loss on sacrum showed beefy red wound bed and has been managed with foam dressing and pressure relief measures.  She has been unable to tolerate nutritional supplements. She has been making slow and inconsistent progress and requires min to max assist overall. She is able to tolerate HD in chair. EDW around 43 kg. HD has been changed to TTS with plans to continue at [facility] set time 10:45 am and family has arranged transportation to HD.  Covid booster given on 05/16 per SNF recommendations.  She was discharged to [facility] on 09/14/20.   Rehab course: During patient's stay in rehab weekly team conferences were held to monitor patient's progress, set goals and discuss barriers to discharge. At admission, patient required max assist with ADL task and total assist with mobility. She exhibited mild to moderate cognitive deficits affecting memory, attention, processing and recall. She  has had improvement in activity tolerance, balance, postural control as well as ability to compensate for deficits. She requires min to max assist for ADL tasks. She requires supervision with bed mobility and is able to stand in parallel bars with mod to max assist and is able to ambulate 5' with mod assist. She requires min to mod assist for basic tasks and for recall of daily  information.   Disposition:   [place].   Diet: Regular - low salt.   Special Instructions: 1. Monitor weights daily.  2. Follow up with neurology at [facility] for further input on AED taper. 3. Pressure relief measures to prevent breakdown.  4. Monitor for SE from Covid booster given 05/16.     Allergies as of 09/15/2020       Reactions   Cephalosporins Hives, Swelling   Clarithromycin Other (See Comments)   Unknown reaction   Ensure [nutritional Supplements] Nausea And Vomiting   Gabapentin    Lunesta  [eszopiclone]    Heart racing   Nepro [compleat] Nausea And Vomiting   Penicillin V    Has patient had a PCN reaction causing immediate rash, facial/tongue/throat swelling, SOB or lightheadedness with hypotension: Yes Has patient had a PCN reaction causing severe rash involving mucus membranes or skin necrosis: No Has patient had a PCN reaction that required hospitalization No Has patient had a PCN reaction occurring within the last 10 years: No If all of the above answers are "NO", then may proceed with Cephalosporin use.   Sulfa Antibiotics  Unknown reaction        Medication List     STOP taking these medications    calcium acetate 667 MG capsule Commonly known as: PHOSLO   clopidogrel 75 MG tablet Commonly known as: PLAVIX   esomeprazole 20 MG capsule Commonly known as: NEXIUM   levETIRAcetam 500 MG tablet Commonly known as: KEPPRA   metoprolol succinate 25 MG 24 hr tablet Commonly known as: TOPROL-XL   saccharomyces boulardii 250 MG capsule Commonly known as: FLORASTOR   zolpidem 10 MG tablet Commonly known as: AMBIEN       TAKE these medications    acetaminophen 325 MG tablet Commonly known as: TYLENOL Take 1-2 tablets (325-650 mg total) by mouth every 4 (four) hours as needed for mild pain.   aspirin 81 MG chewable tablet Chew 1 tablet (81 mg total) by mouth daily.   atorvastatin 40 MG tablet Commonly known as:  LIPITOR Take 1 tablet (40 mg total) by mouth daily. What changed:  medication strength how much to take additional instructions   brivaracetam 25 MG Tabs tablet Commonly known as: BRIVIACT Take 3 tablets (75 mg total) by mouth 2 (two) times daily.   butalbital-acetaminophen-caffeine 50-325-40 MG tablet--Rx # 15 pills Commonly known as: FIORICET Take 1 tablet by mouth 3 (three) times daily as needed for headache. What changed: See the new instructions.   camphor-menthol lotion Commonly known as: SARNA Apply topically as needed for itching.   Chlorhexidine Gluconate Cloth 2 % Pads Apply 6 each topically daily.   Darbepoetin Alfa 60 MCG/0.3ML Sosy injection Commonly known as: ARANESP Inject 0.3 mLs (60 mcg total) into the vein every Saturday with hemodialysis. Start taking on: Sep 20, 2020   docusate sodium 100 MG capsule Commonly known as: COLACE Take 1 capsule (100 mg total) by mouth 2 (two) times daily.   gentamicin cream 0.1 % Commonly known as: GARAMYCIN Apply 1 application topically daily.   lacosamide 50 MG Tabs tablet Commonly known as: VIMPAT Take 1 tablet (50 mg total) by mouth 2 (two) times daily.   melatonin 5 MG Tabs Take 1 tablet (5 mg total) by mouth at bedtime. What changed:  medication strength how much to take   melatonin 5 MG Tabs Take 1 tablet (5 mg total) by mouth at bedtime as needed. What changed: You were already taking a medication with the same name, and this prescription was added. Make sure you understand how and when to take each.   methocarbamol 500 MG tablet Commonly known as: ROBAXIN Take 1 tablet (500 mg total) by mouth every 6 (six) hours as needed for muscle spasms.   metoCLOPramide 5 MG tablet Commonly known as: REGLAN Take 1 tablet (5 mg total) by mouth daily after breakfast. Start taking on: Sep 16, 2020 What changed: when to take this   metoprolol tartrate 25 mg/10 mL Susp Commonly known as: LOPRESSOR Take 2.5 mLs (6.25  mg total) by mouth 2 (two) times daily.   multivitamin Tabs tablet Take 1 tablet by mouth at bedtime.   pantoprazole 40 MG tablet Commonly known as: PROTONIX TAKE 1 TABLET (40 MG TOTAL) BY MOUTH DAILY.   prochlorperazine 10 MG tablet Commonly known as: COMPAZINE Take 1 tablet (10 mg total) by mouth every 8 (eight) hours as needed for nausea or vomiting. What changed: Another medication with the same name was added. Make sure you understand how and when to take each.   traZODone 50 MG tablet Commonly known as: DESYREL Take 0.5-1 tablets (  25-50 mg total) by mouth at bedtime as needed for sleep.   triamcinolone cream 0.1 % Commonly known as: KENALOG Apply topically 3 (three) times daily.        Contact information for follow-up providers     Jamse Arn, MD Follow up.   Specialty: Physical Medicine and Rehabilitation Why: Office will call you with follow up appointment Contact information: [address] [place] [address] [phone]         Minna Merritts, MD Follow up.   Specialty: Cardiology Why: for follow up appointment in 2 weeks.  Contact information: [address] [place] [address] [phone]         Murlean Iba, MD Follow up.   Specialty: Nephrology Contact information: [place] [address] [phone]         Aggie Moats, MD. Call.   Specialty: Neurosurgery Why: for post craniotomy  follow up  Contact information: [place] [address] [phone]              Contact information for after-discharge care     Destination     [facility] .   Service: Skilled Nursing Contact information: [place] [phone]                     Signed: Bary Leriche 09/15/2020, 10:01 AM Patient was seen, face-face, and physical exam performed by me on day of discharge, greater  than 30 minutes of total time spent.. Please see progress note from day of discharge as well.  Delice Lesch, MD, ABPMR

## 2020-09-12 NOTE — Progress Notes (Signed)
[facility] PHYSICAL MEDICINE & REHABILITATION PROGRESS NOTE  Subjective/Complaints: Patient seen laying in bed this AM.  She states she slept well overnight.  She denies complaints. She was seen by Nephro yesterday, notes reviewed, SSRI d/ced.  ROS: Denies CP, SOB, N/V/D  Objective: Vital Signs: Blood pressure 128/69, pulse 92, temperature 98.2 F (36.8 C), resp. rate 18, height 5\' 4"  (1.626 m), weight 45.8 kg, SpO2 97 %. No results found. Recent Labs    09/10/20 0453 09/11/20 1406  WBC 4.2 7.1  HGB 9.5* 10.6*  HCT 29.6* 32.5*  PLT 171 230   Recent Labs    09/11/20 1406 09/12/20 0546  NA 125* 127*  K 3.7 3.5  CL 95* 97*  CO2 25 26  GLUCOSE 121* 83  BUN 16 7*  CREATININE 2.65* 1.52*  CALCIUM 8.1* 7.8*    Intake/Output Summary (Last 24 hours) at 09/12/2020 1036 Last data filed at 09/12/2020 0439 Gross per 24 hour  Intake 300 ml  Output 1500 ml  Net -1200 ml     Pressure Injury 09/06/20 Sacrum Medial Stage 2 -  Partial thickness loss of dermis presenting as a shallow open injury with a red, pink wound bed without slough. small opening- redness around (Active)  09/06/20 2001  Location: Sacrum  Location Orientation: Medial  Staging: Stage 2 -  Partial thickness loss of dermis presenting as a shallow open injury with a red, pink wound bed without slough.  Wound Description (Comments): small opening- redness around  Present on Admission:     Physical Exam: BP 128/69   Pulse 92   Temp 98.2 F (36.8 C)   Resp 18   Ht 5\' 4"  (1.626 m)   Wt 45.8 kg   SpO2 97%   BMI 17.34 kg/m   Constitutional: No distress . Vital signs reviewed. HENT: Normocephalic.  Atraumatic. Eyes: EOMI. No discharge. Cardiovascular: No JVD.  RRR. Respiratory: Normal effort.  No stridor.  Bilateral clear to auscultation. GI: Non-distended.  BS +. Skin: Warm and dry.  Intact. Psych: Normal mood.  Normal behavior. Musc: No edema in extremities.  No tenderness in extremities. Neuro:  Alert Motor: LUE: 4/5 proximal distal, stable RUE: 4/5 proximal distal LLE: Hip flexion 4/5, knee extension 4/5, ankle dorsiflexion 4-/5, unchanged RLE: Hip flexion 4/5, knee extension 4/5, ankle dorsiflexion 3-3+/5, stable  Assessment/Plan: 1. Functional deficits which require 3+ hours per day of interdisciplinary therapy in a comprehensive inpatient rehab setting.  Physiatrist is providing close team supervision and 24 hour management of active medical problems listed below.  Physiatrist and rehab team continue to assess barriers to discharge/monitor patient progress toward functional and medical goals   Care Tool:  Bathing    Body parts bathed by patient: Chest,Abdomen,Front perineal area,Face   Body parts bathed by helper: Buttocks     Bathing assist Assist Level: Moderate Assistance - Patient 50 - 74%     Upper Body Dressing/Undressing Upper body dressing   What is the patient wearing?: Pull over shirt    Upper body assist Assist Level: Minimal Assistance - Patient > 75%    Lower Body Dressing/Undressing Lower body dressing      What is the patient wearing?: Pants     Lower body assist Assist for lower body dressing: Total Assistance - Patient < 25%     Toileting Toileting    Toileting assist Assist for toileting: Total Assistance - Patient < 25%     Transfers Chair/bed transfer  Transfers assist  Chair/bed transfer assist level: Maximal Assistance - Patient 25 - 49% Chair/bed transfer assistive device: Armrests   Locomotion Ambulation   Ambulation assist   Ambulation activity did not occur: Safety/medical concerns  Assist level: Maximal Assistance - Patient 25 - 49% Assistive device: Walker-rolling Max distance: 5'   Walk 10 feet activity   Assist  Walk 10 feet activity did not occur: Safety/medical concerns  Assist level: Moderate Assistance - Patient - 50 - 74% Assistive device: Walker-rolling   Walk 50 feet activity   Assist  Walk 50 feet with 2 turns activity did not occur: Safety/medical concerns         Walk 150 feet activity   Assist Walk 150 feet activity did not occur: Safety/medical concerns         Walk 10 feet on uneven surface  activity   Assist Walk 10 feet on uneven surfaces activity did not occur: Safety/medical concerns   Assist level: Moderate Assistance - Patient - 50 - 74% Assistive device: Aeronautical engineer Will patient use wheelchair at discharge?: Yes Type of Wheelchair: Manual    Wheelchair assist level: Supervision/Verbal cueing Max wheelchair distance: 50'    Wheelchair 50 feet with 2 turns activity    Assist    Wheelchair 50 feet with 2 turns activity did not occur: Safety/medical concerns   Assist Level: Supervision/Verbal cueing   Wheelchair 150 feet activity     Assist  Wheelchair 150 feet activity did not occur: Safety/medical concerns        Medical Problem List and Plan: 1.  Right hemiparesis and aphasia secondary to large left SDH s/p craniotomy  Continue CIR 2.  Impaired mobility -DVT/anticoagulation:  Mechanical: Continue Sequential compression devices, below knee Bilateral lower extremities             -antiplatelet therapy: N/A due to bleed.  3. Chronic migraines/ Pain Management: Continue Fioricet 3-4 times a day on average. Added prn Fioricet q8H  Controlled on 5/13  Monitor with increased exertion 4. Mood: LCSW to follow for evaluation and support.  -antipsychotic agents: N/A 5. Neuropsych: This patient is not fully capable of making decisions on her own behalf. 6. Skin/Wound Care: Monitor wound for healing.             -Family to provide meals from home as able.  -bilateral PRAFOs ordered but patient refuses to wear 7. Fluids/Electrolytes/Nutrition:              --has had chronic issues with variable intake as well as N/V likely due to gastroparesis.  8. Chronic Hypotension: Continue TEDs when out of  bed             Vitals:   09/12/20 0433 09/12/20 0810  BP: 119/68 128/69  Pulse: 95 92  Resp: 18   Temp: 98.2 F (36.8 C)   SpO2: 97%    Controlled on 5/13 9.  ESRD   Transitioned from PD to HD.  HD cath placed on 5/5 10.  Recurrent Seizures: Has been seizure free, continue Brivaracetam and Lacosamide bid.              --Lacosamide was decreased on 03/31 to avoid sedative SE, decreased again on 4/28- appreciate neurology recommendations..  No breakthrough seizures since admission- 5/13 11. CAD/NSTEMI s/p PCI: Treated medically.   Off DAPT due to bleed   Continue Lipitor.   Metoprolol low-dose resumed  Heart rate controlled on 5/13 12. Acute on chronic anemia:  Hemoglobin 10.6 on 5/13  Continue to monitor 13. H/o Depression: Continue Celexa 14. Sick Euthyroid: Will need follow up as illness resolves.              --TSH- 5.80/Free T4-1.2 (03/27) 15. Gastroparesis w/chronic N/V/anorexia: Has been ongoing per records review.              --question failure of PD (husband reports no N/V with brief episode of HD in the past)  Continue Reglan 5 3 times daily, decreased to daily on 5/13  Hesitant to continue increase medications due to #18  Improved overall 16.  Hyponatremia             Chronic hyponatremia with Na-130-133 range  Recs per nephro             --Continue Sodium bicarb to manage metabolic acidosis. 17.  Severe hypoalbuminemia  Supplement d/ced given nausea and vomiting- recommended nuts as source of protein. Her daughter has brought in.   5/7- nephrology giving albumin today.  18.  Prolonged QTC  ECG reviewed, improved since last visit  Compazine and dose/frequency decreased 19.  Sleep disturbance- "never a good sleeper"  Cont trazodone and melatonin    Likely at baseline 20.  Hypokalemia  Potassium 3.9 on 5/11  Scheduled supplement DC'd, monitor.  21. Leukocytosis: Resolved  WBCs 4.3 on 5/11  Afebrile  No signs/symptoms of infection  Cont to monitor  LOS:  39 days A FACE TO FACE EVALUATION WAS PERFORMED  Eiley Mcginnity Lorie Phenix 09/12/2020, 10:36 AM

## 2020-09-12 NOTE — Progress Notes (Addendum)
Patient ID: Amy [name], female   DOB: 1950/08/04, 70 y.o.   MRN: [id number]  Spoke with husband to discuss plan for 10:00 am ambulance transport to [facility] on Monday, awaiting confirmation from [facility] regarding this. She needs to reach out to husband to keep him updated on transport and arrangements once there. Will update once Seth Bake returns call. Pam-PA aware need for COVID test on Sunday so results in Monday. Pt to start OP-HD on Tuesday 5/17.   1:50 PM Another message left for [facility] regarding confirmation of transfer Monday. Asked her to touch base with Mike-pt's husband also.  2:03 PM Spoke with Zacarias Pontes who reports needs afternoon transport will set up at 1:00 pm. Have contacted husband to inform of plan for Monday.

## 2020-09-12 NOTE — Progress Notes (Signed)
Physical Therapy Session Note  Patient Details  Name: Amy [name] MRN: [id number] Date of Birth: 07-06-1950  Today's Date: 09/12/2020 PT Individual Time: 9323-5573 PT Individual Time Calculation (min): 60 min   Short Term Goals: Week 6:  PT Short Term Goal 1 (Week 6): STG=LTG due to ELOS  Skilled Therapeutic Interventions/Progress Updates:    Patient up in w/c.  Reports headache and not feeling well, but willing to participate.  Patient assisted in w/c to therapy gym and in parallel bars performed sit to stand x 3 with mod to max A.  Patient performed L step taps to airex pad with A for R LE stabiization in stance 5 taps x 3 bouts.  Patient propelled w/c x 30' and placed in front of rebounder.  Seated edge of w/c with armrests removed performed 2 x 20 ball toss/catch from rebounder with S and cues for tall posture.  Patient propelled in w/c down hallway with S x 60' with increased time.  Assisted to room and patient sit to stand to RW and ambulated 5' with mod A demonstrating improved R LE stability in stance.  Patient in recliner performed LAQ, hip flexion all x 10.  Seated for strength measurement.  Discussed plans for likely d/c Monday.  Noted per SW plans for ambulance transfer.  Patient left in recliner for lunch with call bell in reach and seat belt alarm active.   Therapy Documentation Precautions:  Precautions Precautions: Fall Precaution Comments: seizure precautions Restrictions Weight Bearing Restrictions: No Other Position/Activity Restrictions: head incision Pain: Pain Assessment Pain Scale: 0-10 Pain Score: 0-No pain Faces Pain Scale: Hurts little more Pain Type: Chronic pain Pain Location: Head Pain Orientation: Anterior Pain Descriptors / Indicators: Headache Pain Onset: On-going Pain Intervention(s): Distraction   Therapy/Group: Individual Therapy  Reginia Naas  Magda Kiel, PT 09/12/2020, 5:30 PM

## 2020-09-12 NOTE — Progress Notes (Signed)
Inpatient Rehabilitation Care Coordinator Discharge Note  The overall goal for the admission was met for:   Discharge location: No-[facility]-SNF  Length of Stay: No-42 days  Discharge activity level: Yes-MIN-MOD LEVEL  Home/community participation: Yes  Services provided included: MD, RD, PT, OT, SLP, RN, CM, Pharmacy, Neuropsych and SW  Financial Services: Medicare and Private Insurance: [place] offered to/list presented to:PT AND HUSBAND  Follow-up services arranged: Other: SNF  Comments (or additional information):PT CONTINUES TO NEED MORE REHAB AND WAS SWITCHED TO HD TO BE ABLE TO GO TO A SNF FACILITY. [facility] OP-HD T, TH SAT 10:45 SLOT. HUSBAND AND FACILITY TO ARRANGE TRANSPORT TO.   Patient/Family verbalized understanding of follow-up arrangements: Yes  Individual responsible for coordination of the follow-up plan: [facility] [phone]  Confirmed correct DME delivered: [name] 09/12/2020    Kayda Allers, [place]

## 2020-09-12 NOTE — Progress Notes (Signed)
Occupational Therapy Session Note  Patient Details  Name: Amy [name] MRN: [id number] Date of Birth: [birth year]  Today's Date: 09/12/2020 OT Individual Time: 0902-1000 OT Individual Time Calculation (min): 58 min    Short Term Goals: Week 6:  OT Short Term Goal 1 (Week 6): Patient will complete SPT consistently with min A. OT Short Term Goal 2 (Week 6): Patient will complete toileting mod A. OT Short Term Goal 3 (Week 6): Patient will complete lower body dressing with min A.  Skilled Therapeutic Interventions/Progress Updates:    Pt received supine in bed, no pain reported, and agreeable to OT session (however, refusing toileting due to "we make her use the BSC instead of bedpan"). Dependent to don ace wraps on BLEs. Pt engaged in warm-up activities of stretching BUEs and BLES supine in preparation for functional transfers. Sup>sit mod A with bed rails, mod-max A to scoot hips forward. Stand pivot transfer bed>w/c max A with increased cues to improve anterior weight shift, stepping, hand placement, and maintaining upright posture during transfer. Pt engaged in grooming activities in w/c at sink with set up A (oral hygiene and washing face). UBD min A. LBD max A for clothing management in stance. Pt left seated in w/c, alarm set, call bell in reach, and all needs met; left while writing in memory notebook.  Therapy Documentation Precautions:  Precautions Precautions: Fall Precaution Comments: seizure precautions Restrictions Weight Bearing Restrictions: No Other Position/Activity Restrictions: head incision Pain: Pain Assessment Pain Scale: 0-10 Pain Score: 0-No pain   Therapy/Group: Individual Therapy  Mellissa Kohut 09/12/2020, 2:40 PM

## 2020-09-13 DIAGNOSIS — S065X9A Traumatic subdural hemorrhage with loss of consciousness of unspecified duration, initial encounter: Secondary | ICD-10-CM | POA: Diagnosis not present

## 2020-09-13 LAB — RENAL FUNCTION PANEL
Albumin: 1.4 g/dL — ABNORMAL LOW (ref 3.5–5.0)
Anion gap: 5 (ref 5–15)
BUN: 14 mg/dL (ref 8–23)
CO2: 25 mmol/L (ref 22–32)
Calcium: 8.1 mg/dL — ABNORMAL LOW (ref 8.9–10.3)
Chloride: 94 mmol/L — ABNORMAL LOW (ref 98–111)
Creatinine, Ser: 2.58 mg/dL — ABNORMAL HIGH (ref 0.44–1.00)
GFR, Estimated: 19 mL/min — ABNORMAL LOW (ref >60)
Glucose, Bld: 133 mg/dL — ABNORMAL HIGH (ref 70–99)
Phosphorus: 2.4 mg/dL — ABNORMAL LOW (ref 2.5–4.6)
Potassium: 3.3 mmol/L — ABNORMAL LOW (ref 3.5–5.1)
Sodium: 124 mmol/L — ABNORMAL LOW (ref 135–145)

## 2020-09-13 LAB — CBC
HCT: 30.8 % — ABNORMAL LOW (ref 36.0–46.0)
Hemoglobin: 10.2 g/dL — ABNORMAL LOW (ref 12.0–15.0)
MCH: 32.5 pg (ref 26.0–34.0)
MCHC: 33.1 g/dL (ref 30.0–36.0)
MCV: 98.1 fL (ref 80.0–100.0)
Platelets: 221 10*3/uL (ref 150–400)
RBC: 3.14 MIL/uL — ABNORMAL LOW (ref 3.87–5.11)
RDW: 15.3 % (ref 11.5–15.5)
WBC: 8 10*3/uL (ref 4.0–10.5)
nRBC: 0 % (ref 0.0–0.2)

## 2020-09-13 MED ORDER — DARBEPOETIN ALFA 60 MCG/0.3ML IJ SOSY
PREFILLED_SYRINGE | INTRAMUSCULAR | Status: AC
  Filled 2020-09-13: qty 0.3

## 2020-09-13 MED ORDER — DARBEPOETIN ALFA 60 MCG/0.3ML IJ SOSY
60.0000 ug | PREFILLED_SYRINGE | INTRAMUSCULAR | Status: DC
  Administered 2020-09-13: 60 ug via INTRAVENOUS
  Filled 2020-09-13: qty 0.3

## 2020-09-13 NOTE — Progress Notes (Signed)
[facility] Progress Note   Subjective:     Amy [name] was seen and examined today at bedside. Patient's husband also at bedside. No complaints at this time. Denies SOB, CP, ABD pain, N/V/D. Plan for HD in recliner today. Plan for d/c to SNF on Monday 09/15/20 and to start HD [facility] on Tuesday 09/16/20.  Objective Vitals:   09/12/20 1938 09/13/20 0435 09/13/20 0611 09/13/20 1341  BP: 126/71 136/76 139/80 131/76  Pulse: 90 84 85 100  Resp: 18 16 18 16   Temp: 98.5 F (36.9 C) 98.7 F (37.1 C) 98.5 F (36.9 C) 98 F (36.7 C)  TempSrc: Oral Oral Oral   SpO2: 99% 97% 96% 98%  Weight: 49.5 kg  49.5 kg   Height:       Physical Exam General: Well appearing; comfortable; no acute respiratory distress Heart: Normal S1 and S2; No murmurs, gallops, or friction rub Lungs: Clear throughout; No wheezing, rales, or rhonchi Abdomen: Soft, non-tender; PD catheter in place Extremities: No edema bilateral lower extremities Dialysis Access: R IJ [facility]  Filed Weights   09/12/20 0433 09/12/20 1938 09/13/20 0611  Weight: 45.8 kg 49.5 kg 49.5 kg    Intake/Output Summary (Last 24 hours) at 09/13/2020 1416 Last data filed at 09/13/2020 1342 Gross per 24 hour  Intake 534 ml  Output 100 ml  Net 434 ml    Additional Objective Labs: Basic Metabolic Panel: Recent Labs  Lab 09/11/20 1137 09/11/20 1406 09/12/20 0546  NA 125* 125* 127*  K 3.7 3.7 3.5  CL 92* 95* 97*  CO2 25 25 26   GLUCOSE 87 121* 83  BUN 15 16 7*  CREATININE 2.54* 2.65* 1.52*  CALCIUM 8.4* 8.1* 7.8*  PHOS 3.1 2.9 2.2*   Liver Function Tests: Recent Labs  Lab 09/11/20 1137 09/11/20 1406 09/12/20 0546  ALBUMIN 1.7* 1.5* 1.3*   No results for input(s): LIPASE, AMYLASE in the last 168 hours. CBC: Recent Labs  Lab 09/08/20 0603 09/10/20 0453 09/11/20 1406  WBC 4.3 4.2 7.1  HGB 9.9* 9.5* 10.6*  HCT 30.4* 29.6* 32.5*  MCV 99.7 100.0 98.2  PLT 190 171 230   Blood Culture     Component Value Date/Time   SDES FLUID 08/28/2020 1007   SPECREQUEST PERITONEAL DIALYSIS 08/28/2020 1007   CULT  08/28/2020 1007    NO GROWTH 3 DAYS Performed at [facility], [place] [address] [place], [place] [address]    REPTSTATUS 08/31/2020 FINAL 08/28/2020 1007    Cardiac Enzymes: No results for input(s): CKTOTAL, CKMB, CKMBINDEX, TROPONINI in the last 168 hours. CBG: No results for input(s): GLUCAP in the last 168 hours. Iron Studies: No results for input(s): IRON, TIBC, TRANSFERRIN, FERRITIN in the last 72 hours. Lab Results  Component Value Date   INR 1.1 09/02/2020   INR 1.1 06/19/2020   INR 1.2 04/04/2019   Studies/Results: No results found.  Medications: . sodium chloride    . sodium chloride     . aspirin EC  81 mg Oral Daily  . atorvastatin  40 mg Oral Daily  . brivaracetam  75 mg Oral BID  . Chlorhexidine Gluconate Cloth  6 each Topical Daily  . darbepoetin (ARANESP) injection - DIALYSIS  60 mcg Intravenous Q Sat-HD  . docusate sodium  100 mg Oral BID  . gentamicin cream  1 application Topical Daily  . heparin  5,000 Units Subcutaneous Q8H  . kidney failure book   Does not apply Once  .  lacosamide  50 mg Oral BID  . melatonin  5 mg Oral QHS  . metoCLOPramide  5 mg Oral QPC breakfast  . metoprolol tartrate  6.25 mg Oral BID  . multivitamin  1 tablet Oral QHS  . pantoprazole  40 mg Oral Daily  . triamcinolone cream   Topical TID    Dialysis Orders: [place] followed by Dr. Candiss Norse ([place]). 4 exchanges overnight, 2000 cc and 1.5 hrs each->transitioned to HD. Renal care manager arranging OP HD plans and PD cath flushes. Please note: Last PD catheter flush inpatient was completed on 09/11/20.  Assessment/Plan: 1.ESRD: Previously on [facility] [place], f/b Dr Candiss Norse of [facility].Bloody effluent seen on takedown 4/15and again 4/28 and 4/29. Not receiving heparin with PD.PD gram stain, cultures and cell count negative  forinfection.D/t plan for SNF, she was transitioned to HD. S/p TDC placement 5/5 with HD afterwards -> Was started on MWF schedule but nowhas outpatient spot [facility] 10:45 am seat time. Plans for discharge 5/16. Will continue on TTS schedule until discharge. Plan for HD today in recliner- HD RN to coordinate this with unit RN. 2. Hypokalemia:chronic while on PD - improved on HD. K+ now 3.5-Monitor. 3.HTN/volume: BP stable. Significant overload on admit, now euvolemic. EDW will be around 43kg. 4.Fall w/ subdural hematoma: S/pSDH [facility]22 5. Anemia of ESRD: Hgb now 10.6. ESA not indicated at this time d/t Hgb greater than 10-will monitor trend. 6.MBD: CorrCa 9.96, Phos low at 2.2. No binder or VDRA, follow. 7.Nausea/ vomiting: Intermittent, using Reglan TID. 8. Malnutrition/ debility: Alb very low. Previously had refused changing to HD, now agreeable since will be going to SNF.Has not tolerated protein supplements. 9.Multivessel CAD/HFrEF (25-30% LHC 06/2020):Per TCTSwasnotCABG candidate. UnderwentPCI to LAD. At [facility] repeat echo per family showed improved EF 55%. 10. Chronichyponatremia: Stable and euvolemic appearing. Off Na tabs.SSRI d/c'd. Pt has chronically low Na+ levels, reset osmostat most likely. Not vol related. Would not treat unless Na< 125, and then would give NaCl tabs 1-2 gm bid for goal Na+ > 128- 135 range.  11. Metabolic acidosis: stable, off bicarb tabs. Bicarb levels have normalized due to hemodialysis, bicarb tabs have been dc'd.  12.HxSeizures-per primary team- vimpat was reduced 4/29 for side effects.  31 Debility: 2/2 Multiple med issues, most recently SDH recovery and NSTEMI w/ CAD and low EF. Readmitted to CIR on 08/04/20.Now planning discharge to SNFwithtransition to HD.  Tobie Poet, NP [facility] 09/13/2020,2:16 PM  LOS: 40 days

## 2020-09-13 NOTE — Progress Notes (Signed)
[place] PHYSICAL MEDICINE & REHABILITATION PROGRESS NOTE  Subjective/Complaints: Patient is looking forward to skilled nursing discharged to [facility] she will be closer to home.  Plans on discharging on Monday.  Has hemodialysis today, reviewed lab work with patient and husband  ROS: Denies CP, SOB, N/V/D  Objective: Vital Signs: Blood pressure 139/80, pulse 85, temperature 98.5 F (36.9 C), temperature source Oral, resp. rate 18, height 5\' 4"  (1.626 m), weight 49.5 kg, SpO2 96 %. No results found. Recent Labs    09/11/20 1406  WBC 7.1  HGB 10.6*  HCT 32.5*  PLT 230   Recent Labs    09/11/20 1406 09/12/20 0546  NA 125* 127*  K 3.7 3.5  CL 95* 97*  CO2 25 26  GLUCOSE 121* 83  BUN 16 7*  CREATININE 2.65* 1.52*  CALCIUM 8.1* 7.8*    Intake/Output Summary (Last 24 hours) at 09/13/2020 1326 Last data filed at 09/13/2020 0756 Gross per 24 hour  Intake 357 ml  Output 100 ml  Net 257 ml     Pressure Injury 09/06/20 Sacrum Medial Stage 2 -  Partial thickness loss of dermis presenting as a shallow open injury with a red, pink wound bed without slough. small opening- redness around (Active)  09/06/20 2001  Location: Sacrum  Location Orientation: Medial  Staging: Stage 2 -  Partial thickness loss of dermis presenting as a shallow open injury with a red, pink wound bed without slough.  Wound Description (Comments): small opening- redness around  Present on Admission:     Physical Exam: BP 139/80 (BP Location: Right Arm)   Pulse 85   Temp 98.5 F (36.9 C) (Oral)   Resp 18   Ht 5\' 4"  (1.626 m)   Wt 49.5 kg   SpO2 96%   BMI 18.73 kg/m    General: No acute distress Mood and affect are appropriate Heart: Regular rate and rhythm no rubs murmurs or extra sounds Lungs: Clear to auscultation, breathing unlabored, no rales or wheezes Abdomen: Positive bowel sounds, soft nontender to palpation, nondistended Extremities: No clubbing, cyanosis, or edema Skin: No  evidence of breakdown, no evidence of rash  Motor: LUE: 4/5 proximal distal, stable RUE: 4/5 proximal distal LLE: Hip flexion 4/5, knee extension 4/5, ankle dorsiflexion 4-/5, unchanged RLE: Hip flexion 4/5, knee extension 4/5, ankle dorsiflexion 3-3+/5, stable  Assessment/Plan: 1. Functional deficits which require 3+ hours per day of interdisciplinary therapy in a comprehensive inpatient rehab setting.  Physiatrist is providing close team supervision and 24 hour management of active medical problems listed below.  Physiatrist and rehab team continue to assess barriers to discharge/monitor patient progress toward functional and medical goals   Care Tool:  Bathing    Body parts bathed by patient: Chest,Abdomen,Front perineal area,Face   Body parts bathed by helper: Buttocks     Bathing assist Assist Level: Moderate Assistance - Patient 50 - 74%     Upper Body Dressing/Undressing Upper body dressing   What is the patient wearing?: Pull over shirt    Upper body assist Assist Level: Minimal Assistance - Patient > 75%    Lower Body Dressing/Undressing Lower body dressing      What is the patient wearing?: Pants     Lower body assist Assist for lower body dressing: Maximal Assistance - Patient 25 - 49%     Toileting Toileting    Toileting assist Assist for toileting: Total Assistance - Patient < 25%     Transfers Chair/bed transfer  Transfers  assist     Chair/bed transfer assist level: Moderate Assistance - Patient 50 - 74% Chair/bed transfer assistive device: Programmer, multimedia   Ambulation assist   Ambulation activity did not occur: Safety/medical concerns  Assist level: Moderate Assistance - Patient 50 - 74% Assistive device: Walker-rolling Max distance: 5'   Walk 10 feet activity   Assist  Walk 10 feet activity did not occur: Safety/medical concerns  Assist level: Moderate Assistance - Patient - 50 - 74% Assistive device:  Walker-rolling   Walk 50 feet activity   Assist Walk 50 feet with 2 turns activity did not occur: Safety/medical concerns         Walk 150 feet activity   Assist Walk 150 feet activity did not occur: Safety/medical concerns         Walk 10 feet on uneven surface  activity   Assist Walk 10 feet on uneven surfaces activity did not occur: Safety/medical concerns   Assist level: Moderate Assistance - Patient - 50 - 74% Assistive device: Aeronautical engineer Will patient use wheelchair at discharge?: Yes Type of Wheelchair: Manual    Wheelchair assist level: Supervision/Verbal cueing Max wheelchair distance: 60'    Wheelchair 50 feet with 2 turns activity    Assist    Wheelchair 50 feet with 2 turns activity did not occur: Safety/medical concerns   Assist Level: Supervision/Verbal cueing   Wheelchair 150 feet activity     Assist  Wheelchair 150 feet activity did not occur: Safety/medical concerns        Medical Problem List and Plan: 1.  Right hemiparesis and aphasia secondary to large left SDH s/p craniotomy  Continue CIR 2.  Impaired mobility -DVT/anticoagulation:  Mechanical: Continue Sequential compression devices, below knee Bilateral lower extremities             -antiplatelet therapy: N/A due to bleed.  3. Chronic migraines/ Pain Management: Continue Fioricet 3-4 times a day on average. Added prn Fioricet q8H  Controlled on 5/13  Monitor with increased exertion 4. Mood: LCSW to follow for evaluation and support.  -antipsychotic agents: N/A 5. Neuropsych: This patient is not fully capable of making decisions on her own behalf. 6. Skin/Wound Care: Monitor wound for healing.             -Family to provide meals from home as able.  -bilateral PRAFOs ordered but patient refuses to wear 7. Fluids/Electrolytes/Nutrition:              --has had chronic issues with variable intake as well as N/V likely due to gastroparesis.   8. Chronic Hypotension: Continue TEDs when out of bed             Vitals:   09/13/20 0435 09/13/20 0611  BP: 136/76 139/80  Pulse: 84 85  Resp: 16 18  Temp: 98.7 F (37.1 C) 98.5 F (36.9 C)  SpO2: 97% 96%   Controlled on 5/14 9.  ESRD   Transitioned from PD to HD.  HD cath placed on 5/5 10.  Recurrent Seizures: Has been seizure free, continue Brivaracetam and Lacosamide bid.              --Lacosamide was decreased on 03/31 to avoid sedative SE, decreased again on 4/28- appreciate neurology recommendations..  No breakthrough seizures since admission- 5/13 11. CAD/NSTEMI s/p PCI: Treated medically.   Off DAPT due to bleed   Continue Lipitor.   Metoprolol low-dose resumed  Heart rate  controlled on 5/13 12. Acute on chronic anemia:   Hemoglobin 10.6 on 5/13  Continue to monitor 13. H/o Depression: Continue Celexa 14. Sick Euthyroid: Will need follow up as illness resolves.              --TSH- 5.80/Free T4-1.2 (03/27) 15. Gastroparesis w/chronic N/V/anorexia: Has been ongoing per records review.              --question failure of PD (husband reports no N/V with brief episode of HD in the past)  Continue Reglan 5 3 times daily, decreased to daily on 5/13  Hesitant to continue increase medications due to #18  Improved overall 16.  Hyponatremia             Chronic hyponatremia with Na-130-133 range  Recs per nephro             --Continue Sodium bicarb to manage metabolic acidosis. 17.  Severe hypoalbuminemia  Supplement d/ced given nausea and vomiting- recommended nuts as source of protein. Her daughter has brought in.   5/7- nephrology giving albumin today.  18.  Prolonged QTC  ECG reviewed, improved since last visit  Compazine and dose/frequency decreased 19.  Sleep disturbance- "never a good sleeper"  Cont trazodone and melatonin    Likely at baseline 20.  Hypokalemia  Potassium 3.9 on 5/11  Scheduled supplement DC'd, monitor.  21. Leukocytosis: Resolved  WBCs 4.3 on  5/11  Afebrile  No signs/symptoms of infection  Cont to monitor  LOS: 40 days A FACE TO [place] Eline Geng 09/13/2020, 1:26 PM

## 2020-09-13 NOTE — Progress Notes (Signed)
Physical Therapy Session Note  Patient Details  Name: Amy [name] MRN: [id number] Date of Birth: 08-Jul-1950  Today's Date: 09/12/2020 PT Individual Time: 4076-8088 PT Individual Time Calculation (min): 60 min   Short Term Goals: Week 6:  PT Short Term Goal 1 (Week 6): STG=LTG due to ELOS  Skilled Therapeutic Interventions/Progress Updates:  Patient seated in recliner with BLE elevated on entrance to room. Patient alert and initially confused as she has just finished physical therapy and was unaware that she would have another session despite having handwritten schedule in front of her with session included. Pt willing to participate in room but is fatigued from previous session. Patient denied pain during session.  Pt is able to scoot forward to EOC and performs STS to RW with Mod A. Intent is to ambulate 5 feet to end of bed. On reaching upright stance, pt states she is too tired and returns to sit. Requires seated rest break prior to 2nd attempt and is able to reach upright stance with mod/ Max and unable to maintain upright stance. Pt unwilling to continue any standing activities and returns to sitting back in recliner. Guided in PROM stretches to Bil gastroc/ soleus group with total 13min  To each LE. Guided in marches, LAQs, AROM and  hip abd/ add against light PT resistance x10.   Patient seated in recliner with BLE elevated at end of session with brakes locked, belt alarm set, and all needs within reach.     Therapy Documentation Precautions:  Precautions Precautions: Fall Precaution Comments: seizure precautions Restrictions Weight Bearing Restrictions: No Other Position/Activity Restrictions: head incision  Therapy/Group: Individual Therapy  Alger Simons PT, DPT 09/12/2020, 9:25 PM

## 2020-09-14 LAB — SARS CORONAVIRUS 2 (TAT 6-24 HRS): SARS Coronavirus 2: NEGATIVE

## 2020-09-14 NOTE — Progress Notes (Signed)
Physical Therapy Session Note  Patient Details  Name: Amy [name] MRN: [id number] Date of Birth: Sep 15, 1950  Today's Date: 09/14/2020 PT Individual Time: 1000-1045 PT Individual Time Calculation (min): 45 min  Missed time 34' in AM., pt refused x 30' in PM. Short Term Goals: Week 5:  PT Short Term Goal 1 (Week 5): Patient will perform transfers with min A at least 30% of the time. PT Short Term Goal 1 - Progress (Week 5): Not met PT Short Term Goal 2 (Week 5): Patient will ambulate 69' with mod A with RW PT Short Term Goal 2 - Progress (Week 5): Not met PT Short Term Goal 3 (Week 5): Patient will propel w/c 100' with S PT Short Term Goal 3 - Progress (Week 5): Progressing toward goal PT Short Term Goal 4 (Week 5): Patient will tolerate standing 1 minute with min to mod A for balance for functional task PT Short Term Goal 4 - Progress (Week 5): Progressing toward goal  Skilled Therapeutic Interventions/Progress Updates:   First session:  Pt presents supine in bed and refusing any OOB activity.  Pt encouraged to participate and agrees to supine therex.  Pt performed AP, HS, abd/add, SLR (w/ AAROM), isometric add 3 x 10-15.  Pt requires rest breaks between trials.  Pt rolls side to side w/ side rail and hook-lying position and supervision to replace Chux pad to proper position.  Pt able to scoot to [facility] w/ BUE and LEs.  Pt still refusing OOB, but pt informed that this PT will return in PM for PT session and we will get OOB.  Pt remained supine w/ bed alarm on and all needs in reach.  Second session:  Pt refused to participate in PM.  Pt c/o not feeling well and nauseated, vomiting.  Nursing notified.     Therapy Documentation Precautions:  Precautions Precautions: Fall Precaution Comments: seizure precautions Restrictions Weight Bearing Restrictions: No Other Position/Activity Restrictions: head incision General: PT Amount of Missed Time (min): 15 Minutes PT Missed Treatment Reason:  Other (Comment);Patient fatigue (pt refused any further rx.) Vital Signs:   Pain: pt states HA, but does not quantify. Pain Assessment Pain Scale: 0-10 Pain Score: 7  Pain Descriptors / Indicators: Headache Pain Intervention(s): Medication (See eMAR)    Therapy/Group: Individual Therapy  Ladoris Gene 09/14/2020, 10:49 AM

## 2020-09-14 NOTE — Progress Notes (Signed)
Amy [name] Progress Note   Subjective:     Amy [name] was seen and examined today at bedside. Patient's daughter also at bedside. No complaints. Denies SOB, CP, ABD pain, N/V/D. Last HD on 09/13/20 in recliner-tolerated UF 1L. Plan for d/c to SNF on Monday 09/15/20 and to start HD at [facility]) on Tuesday 09/16/20.  Objective Vitals:   09/13/20 1905 09/13/20 2124 09/14/20 0534 09/14/20 1324  BP: 140/73 138/64 137/76 137/68  Pulse: 90 87 83 88  Resp: 18 18 17 18   Temp: 98.2 F (36.8 C) 97.6 F (36.4 C) 98.1 F (36.7 C) 98.4 F (36.9 C)  TempSrc: Oral     SpO2:  100% 97% 99%  Weight:   48.6 kg   Height:       Physical Exam General:Well appearing; comfortable; no acute respiratory distress Heart:Normal S1 and S2; No murmurs, gallops, or friction rub Lungs:Clear throughout; No wheezing, rales, or rhonchi Abdomen:Soft, non-tender; PD catheter in place Extremities:No edema bilateral lower extremities Dialysis Access:[facility]  Filed Weights   09/12/20 1938 09/13/20 0611 09/14/20 0534  Weight: 49.5 kg 49.5 kg 48.6 kg    Intake/Output Summary (Last 24 hours) at 09/14/2020 1520 Last data filed at 09/14/2020 0754 Gross per 24 hour  Intake 356 ml  Output 1000 ml  Net -644 ml    Additional Objective Labs: Basic Metabolic Panel: Recent Labs  Lab 09/11/20 1406 09/12/20 0546 09/13/20 1511  NA 125* 127* 124*  K 3.7 3.5 3.3*  CL 95* 97* 94*  CO2 25 26 25   GLUCOSE 121* 83 133*  BUN 16 7* 14  CREATININE 2.65* 1.52* 2.58*  CALCIUM 8.1* 7.8* 8.1*  PHOS 2.9 2.2* 2.4*   Liver Function Tests: Recent Labs  Lab 09/11/20 1406 09/12/20 0546 09/13/20 1511  ALBUMIN 1.5* 1.3* 1.4*   No results for input(s): LIPASE, AMYLASE in the last 168 hours. CBC: Recent Labs  Lab 09/08/20 0603 09/10/20 0453 09/11/20 1406 09/13/20 1511  WBC 4.3 4.2 7.1 8.0  HGB 9.9* 9.5* 10.6* 10.2*  HCT 30.4* 29.6* 32.5* 30.8*  MCV 99.7 100.0 98.2 98.1  PLT 190 171  230 221   Blood Culture    Component Value Date/Time   SDES FLUID 08/28/2020 1007   SPECREQUEST PERITONEAL DIALYSIS 08/28/2020 1007   CULT  08/28/2020 1007    NO GROWTH 3 DAYS Performed at [facility], [place] [address], [place], [place] [address]    REPTSTATUS 08/31/2020 FINAL 08/28/2020 1007    Lab Results  Component Value Date   INR 1.1 09/02/2020   INR 1.1 06/19/2020   INR 1.2 04/04/2019   Studies/Results: No results found.  Medications:  . aspirin EC  81 mg Oral Daily  . atorvastatin  40 mg Oral Daily  . brivaracetam  75 mg Oral BID  . Chlorhexidine Gluconate Cloth  6 each Topical Daily  . darbepoetin (ARANESP) injection - DIALYSIS  60 mcg Intravenous Q Sat-HD  . docusate sodium  100 mg Oral BID  . gentamicin cream  1 application Topical Daily  . heparin  5,000 Units Subcutaneous Q8H  . kidney failure book   Does not apply Once  . lacosamide  50 mg Oral BID  . melatonin  5 mg Oral QHS  . metoCLOPramide  5 mg Oral QPC breakfast  . metoprolol tartrate  6.25 mg Oral BID  . multivitamin  1 tablet Oral QHS  . pantoprazole  40 mg Oral Daily  . triamcinolone cream  Topical TID    Dialysis Orders: [place] followed by Dr. Candiss Norse ([place]). 4 exchanges overnight, 2000 cc and 1.5 hrs each->transitioned to HD. Renal care manager arranging OP HD plans and PD cath flushes. Please note: Last PD catheter flush inpatient was completed on 09/11/20.  Assessment/Plan: 1.ESRD: Previously on [facility] [place], f/b Dr Candiss Norse of [place].Bloody effluent seen on takedown 4/15and again 4/28 and 4/29. Not receiving heparin with PD.PD gram stain, cultures and cell count negative forinfection.D/t plan for SNF, she was transitioned to HD. S/p TDC placement 5/5 with HD afterwards -> Was started on MWF schedule but nowhas outpatient spot [facility]  TTS 10:45 am seat time. Plans for discharge 5/16. Will continue on TTS schedule until discharge. Last HD on  09/13/20-patient tolerated being in the recliner with UF 1L. 2. Hypokalemia:chronic while on PD - improved on HD. K+ now 3.3-Monitor. 3.HTN/volume: BP stable. Significant overload on admit, now euvolemic. EDW will be around 43kg. 4.Fall w/ subdural hematoma: S/pSDH evacuation at [facility] on3/24/22 5. Anemia of ESRD: Hgbnow 10.2. ESA not indicated at this time d/t Hgb greater than 10-will monitor trend. 6.MBD: CorrCa9.96, [place] at 2.4. No binder or VDRA, follow. 7.Nausea/ vomiting: Intermittent, using Reglan TID. 8. Malnutrition/ debility: Alb very low. Previously had refused changing to HD, now agreeable since will be going to SNF.Has not tolerated protein supplements. 9.Multivessel CAD/HFrEF (25-30% LHC 06/2020):Per TCTSwasnotCABG candidate. UnderwentPCI to LAD. At [facility] per family showed improved EF 55%. 10. Chronichyponatremia: Stable and euvolemic appearing. Off Na tabs.SSRI d/c'd. Pt has chronically low Na+ levels, reset osmostat most likely. Not vol related. Would not treat unless Na< 125, and then would give NaCl tabs 1-2 gm bid for goal Na+ > 128- 135 range. 11. Metabolic acidosis:stable, off bicarb tabs. Bicarb levels have normalized due to hemodialysis, bicarb tabs have been dc'd. 12.HxSeizures-per primary team- vimpat was reduced 4/29 for side effects.  91 Debility: 2/2 Multiple med issues, most recently SDH recovery and NSTEMI w/ CAD and low EF. Readmitted to CIR on 08/04/20.Now planning discharge to SNFwithtransition to HD-please see above.  Tobie Poet, NP [name] 09/14/2020,3:20 PM  LOS: 41 days

## 2020-09-15 ENCOUNTER — Encounter (HOSPITAL_COMMUNITY): Payer: Self-pay | Admitting: Physical Medicine & Rehabilitation

## 2020-09-15 DIAGNOSIS — S065X9A Traumatic subdural hemorrhage with loss of consciousness of unspecified duration, initial encounter: Secondary | ICD-10-CM | POA: Diagnosis not present

## 2020-09-15 DIAGNOSIS — I5042 Chronic combined systolic (congestive) and diastolic (congestive) heart failure: Secondary | ICD-10-CM | POA: Diagnosis not present

## 2020-09-15 DIAGNOSIS — W19XXXD Unspecified fall, subsequent encounter: Secondary | ICD-10-CM | POA: Diagnosis not present

## 2020-09-15 DIAGNOSIS — E871 Hypo-osmolality and hyponatremia: Secondary | ICD-10-CM | POA: Diagnosis present

## 2020-09-15 DIAGNOSIS — Z87898 Personal history of other specified conditions: Secondary | ICD-10-CM

## 2020-09-15 DIAGNOSIS — I469 Cardiac arrest, cause unspecified: Secondary | ICD-10-CM | POA: Diagnosis not present

## 2020-09-15 DIAGNOSIS — W19XXXA Unspecified fall, initial encounter: Secondary | ICD-10-CM | POA: Diagnosis not present

## 2020-09-15 DIAGNOSIS — I2510 Atherosclerotic heart disease of native coronary artery without angina pectoris: Secondary | ICD-10-CM | POA: Diagnosis present

## 2020-09-15 DIAGNOSIS — N186 End stage renal disease: Secondary | ICD-10-CM | POA: Diagnosis present

## 2020-09-15 DIAGNOSIS — I1311 Hypertensive heart and chronic kidney disease without heart failure, with stage 5 chronic kidney disease, or end stage renal disease: Secondary | ICD-10-CM | POA: Diagnosis not present

## 2020-09-15 DIAGNOSIS — F419 Anxiety disorder, unspecified: Secondary | ICD-10-CM | POA: Diagnosis not present

## 2020-09-15 DIAGNOSIS — Z992 Dependence on renal dialysis: Secondary | ICD-10-CM | POA: Diagnosis not present

## 2020-09-15 DIAGNOSIS — Z741 Need for assistance with personal care: Secondary | ICD-10-CM | POA: Diagnosis not present

## 2020-09-15 DIAGNOSIS — J811 Chronic pulmonary edema: Secondary | ICD-10-CM | POA: Diagnosis not present

## 2020-09-15 DIAGNOSIS — I5022 Chronic systolic (congestive) heart failure: Secondary | ICD-10-CM | POA: Diagnosis not present

## 2020-09-15 DIAGNOSIS — R110 Nausea: Secondary | ICD-10-CM | POA: Diagnosis not present

## 2020-09-15 DIAGNOSIS — R519 Headache, unspecified: Secondary | ICD-10-CM | POA: Diagnosis present

## 2020-09-15 DIAGNOSIS — S065X3D Traumatic subdural hemorrhage with loss of consciousness of 1 hour to 5 hours 59 minutes, subsequent encounter: Secondary | ICD-10-CM | POA: Diagnosis not present

## 2020-09-15 DIAGNOSIS — E785 Hyperlipidemia, unspecified: Secondary | ICD-10-CM | POA: Diagnosis present

## 2020-09-15 DIAGNOSIS — I132 Hypertensive heart and chronic kidney disease with heart failure and with stage 5 chronic kidney disease, or end stage renal disease: Secondary | ICD-10-CM | POA: Diagnosis present

## 2020-09-15 DIAGNOSIS — E8770 Fluid overload, unspecified: Secondary | ICD-10-CM | POA: Diagnosis present

## 2020-09-15 DIAGNOSIS — D509 Iron deficiency anemia, unspecified: Secondary | ICD-10-CM | POA: Diagnosis not present

## 2020-09-15 DIAGNOSIS — T8249XA Other complication of vascular dialysis catheter, initial encounter: Secondary | ICD-10-CM | POA: Diagnosis present

## 2020-09-15 DIAGNOSIS — R601 Generalized edema: Secondary | ICD-10-CM | POA: Diagnosis not present

## 2020-09-15 DIAGNOSIS — T829XXD Unspecified complication of cardiac and vascular prosthetic device, implant and graft, subsequent encounter: Secondary | ICD-10-CM | POA: Diagnosis not present

## 2020-09-15 DIAGNOSIS — R569 Unspecified convulsions: Secondary | ICD-10-CM | POA: Diagnosis not present

## 2020-09-15 DIAGNOSIS — S065X9D Traumatic subdural hemorrhage with loss of consciousness of unspecified duration, subsequent encounter: Secondary | ICD-10-CM | POA: Diagnosis not present

## 2020-09-15 DIAGNOSIS — R1110 Vomiting, unspecified: Secondary | ICD-10-CM | POA: Diagnosis not present

## 2020-09-15 DIAGNOSIS — I120 Hypertensive chronic kidney disease with stage 5 chronic kidney disease or end stage renal disease: Secondary | ICD-10-CM | POA: Diagnosis not present

## 2020-09-15 DIAGNOSIS — Z888 Allergy status to other drugs, medicaments and biological substances status: Secondary | ICD-10-CM | POA: Diagnosis not present

## 2020-09-15 DIAGNOSIS — Z20822 Contact with and (suspected) exposure to covid-19: Secondary | ICD-10-CM | POA: Diagnosis present

## 2020-09-15 DIAGNOSIS — Z880 Allergy status to penicillin: Secondary | ICD-10-CM | POA: Diagnosis not present

## 2020-09-15 DIAGNOSIS — I214 Non-ST elevation (NSTEMI) myocardial infarction: Secondary | ICD-10-CM | POA: Diagnosis not present

## 2020-09-15 DIAGNOSIS — I5023 Acute on chronic systolic (congestive) heart failure: Secondary | ICD-10-CM | POA: Diagnosis present

## 2020-09-15 DIAGNOSIS — Z882 Allergy status to sulfonamides status: Secondary | ICD-10-CM | POA: Diagnosis not present

## 2020-09-15 DIAGNOSIS — M7989 Other specified soft tissue disorders: Secondary | ICD-10-CM | POA: Diagnosis not present

## 2020-09-15 DIAGNOSIS — G40909 Epilepsy, unspecified, not intractable, without status epilepticus: Secondary | ICD-10-CM | POA: Diagnosis present

## 2020-09-15 DIAGNOSIS — G4089 Other seizures: Secondary | ICD-10-CM | POA: Diagnosis not present

## 2020-09-15 DIAGNOSIS — R112 Nausea with vomiting, unspecified: Secondary | ICD-10-CM | POA: Diagnosis not present

## 2020-09-15 DIAGNOSIS — G8918 Other acute postprocedural pain: Secondary | ICD-10-CM | POA: Diagnosis not present

## 2020-09-15 DIAGNOSIS — S065X0A Traumatic subdural hemorrhage without loss of consciousness, initial encounter: Secondary | ICD-10-CM | POA: Diagnosis not present

## 2020-09-15 DIAGNOSIS — F39 Unspecified mood [affective] disorder: Secondary | ICD-10-CM | POA: Diagnosis not present

## 2020-09-15 DIAGNOSIS — I10 Essential (primary) hypertension: Secondary | ICD-10-CM | POA: Diagnosis not present

## 2020-09-15 DIAGNOSIS — K219 Gastro-esophageal reflux disease without esophagitis: Secondary | ICD-10-CM | POA: Diagnosis not present

## 2020-09-15 DIAGNOSIS — J810 Acute pulmonary edema: Secondary | ICD-10-CM | POA: Diagnosis not present

## 2020-09-15 DIAGNOSIS — T829XXA Unspecified complication of cardiac and vascular prosthetic device, implant and graft, initial encounter: Secondary | ICD-10-CM | POA: Diagnosis not present

## 2020-09-15 DIAGNOSIS — R1111 Vomiting without nausea: Secondary | ICD-10-CM | POA: Diagnosis not present

## 2020-09-15 DIAGNOSIS — Z955 Presence of coronary angioplasty implant and graft: Secondary | ICD-10-CM | POA: Diagnosis not present

## 2020-09-15 DIAGNOSIS — D631 Anemia in chronic kidney disease: Secondary | ICD-10-CM | POA: Diagnosis present

## 2020-09-15 DIAGNOSIS — R278 Other lack of coordination: Secondary | ICD-10-CM | POA: Diagnosis not present

## 2020-09-15 DIAGNOSIS — E042 Nontoxic multinodular goiter: Secondary | ICD-10-CM | POA: Diagnosis present

## 2020-09-15 DIAGNOSIS — E878 Other disorders of electrolyte and fluid balance, not elsewhere classified: Secondary | ICD-10-CM | POA: Diagnosis present

## 2020-09-15 DIAGNOSIS — D638 Anemia in other chronic diseases classified elsewhere: Secondary | ICD-10-CM | POA: Diagnosis not present

## 2020-09-15 DIAGNOSIS — Y841 Kidney dialysis as the cause of abnormal reaction of the patient, or of later complication, without mention of misadventure at the time of the procedure: Secondary | ICD-10-CM | POA: Diagnosis present

## 2020-09-15 DIAGNOSIS — N2581 Secondary hyperparathyroidism of renal origin: Secondary | ICD-10-CM | POA: Diagnosis present

## 2020-09-15 DIAGNOSIS — S065X0D Traumatic subdural hemorrhage without loss of consciousness, subsequent encounter: Secondary | ICD-10-CM | POA: Diagnosis not present

## 2020-09-15 DIAGNOSIS — D649 Anemia, unspecified: Secondary | ICD-10-CM | POA: Diagnosis not present

## 2020-09-15 DIAGNOSIS — E872 Acidosis: Secondary | ICD-10-CM | POA: Diagnosis not present

## 2020-09-15 DIAGNOSIS — I25810 Atherosclerosis of coronary artery bypass graft(s) without angina pectoris: Secondary | ICD-10-CM

## 2020-09-15 DIAGNOSIS — J90 Pleural effusion, not elsewhere classified: Secondary | ICD-10-CM | POA: Diagnosis not present

## 2020-09-15 DIAGNOSIS — R4189 Other symptoms and signs involving cognitive functions and awareness: Secondary | ICD-10-CM | POA: Diagnosis not present

## 2020-09-15 DIAGNOSIS — I252 Old myocardial infarction: Secondary | ICD-10-CM | POA: Diagnosis not present

## 2020-09-15 DIAGNOSIS — M6281 Muscle weakness (generalized): Secondary | ICD-10-CM | POA: Diagnosis not present

## 2020-09-15 DIAGNOSIS — R5381 Other malaise: Secondary | ICD-10-CM | POA: Diagnosis not present

## 2020-09-15 DIAGNOSIS — G479 Sleep disorder, unspecified: Secondary | ICD-10-CM | POA: Diagnosis not present

## 2020-09-15 DIAGNOSIS — R609 Edema, unspecified: Secondary | ICD-10-CM | POA: Diagnosis not present

## 2020-09-15 DIAGNOSIS — E876 Hypokalemia: Secondary | ICD-10-CM | POA: Diagnosis present

## 2020-09-15 DIAGNOSIS — A0472 Enterocolitis due to Clostridium difficile, not specified as recurrent: Secondary | ICD-10-CM | POA: Diagnosis not present

## 2020-09-15 DIAGNOSIS — E43 Unspecified severe protein-calorie malnutrition: Secondary | ICD-10-CM | POA: Diagnosis not present

## 2020-09-15 LAB — CBC
HCT: 33.8 % — ABNORMAL LOW (ref 36.0–46.0)
Hemoglobin: 11.2 g/dL — ABNORMAL LOW (ref 12.0–15.0)
MCH: 32.4 pg (ref 26.0–34.0)
MCHC: 33.1 g/dL (ref 30.0–36.0)
MCV: 97.7 fL (ref 80.0–100.0)
Platelets: 222 10*3/uL (ref 150–400)
RBC: 3.46 MIL/uL — ABNORMAL LOW (ref 3.87–5.11)
RDW: 15.1 % (ref 11.5–15.5)
WBC: 6.7 10*3/uL (ref 4.0–10.5)
nRBC: 0 % (ref 0.0–0.2)

## 2020-09-15 MED ORDER — METOCLOPRAMIDE HCL 5 MG PO TABS: 5.0000 mg | ORAL_TABLET | Freq: Every day | ORAL | Status: DC

## 2020-09-15 MED ORDER — DARBEPOETIN ALFA 60 MCG/0.3ML IJ SOSY: 60.0000 ug | PREFILLED_SYRINGE | INTRAMUSCULAR | Status: DC

## 2020-09-15 MED ORDER — COVID-19 MRNA VACC (MODERNA) 50 MCG/0.25ML IM SUSP
0.2500 mL | Freq: Once | INTRAMUSCULAR | Status: AC
  Administered 2020-09-15: 0.25 mL via INTRAMUSCULAR
  Filled 2020-09-15: qty 0.25

## 2020-09-15 MED ORDER — PROCHLORPERAZINE MALEATE 5 MG PO TABS: 5.0000 mg | ORAL_TABLET | Freq: Three times a day (TID) | ORAL | 0 refills | Status: DC | PRN

## 2020-09-15 NOTE — Progress Notes (Signed)
Renal Navigator faxed Renal Note and Discharge Summary to [facility] in preparation for patient to start at outpatient HD clinic [place] [facility] in [place]  tomorrow, 09/16/20.   Alphonzo Cruise, [facility] Renal Navigator [phone]

## 2020-09-15 NOTE — Discharge Instructions (Signed)
Inpatient Rehab Discharge Instructions  EMANUELLA NICKLE Discharge date and time: 09/15/20   Activities/Precautions/ Functional Status: Activity: activity as tolerated Diet: regular diet Wound Care: keep wound clean and dry    Functional status:  ___ No restrictions     ___ Walk up steps independently _X__ 24/7 supervision/assistance   ___ Walk up steps with assistance ___ Intermittent supervision/assistance  ___ Bathe/dress independently ___ Walk with walker     _X__ Bathe/dress with assistance ___ Walk Independently    ___ Shower independently ___ Walk with assistance    ___ Shower with assistance _X__ No alcohol     ___ Return to work/school ________  Special Instructions:    My questions have been answered and I understand these instructions. I will adhere to these goals and the provided educational materials after my discharge from the hospital.  Patient/Caregiver Signature _______________________________ Date __________  Clinician Signature _______________________________________ Date __________  Please bring this form and your medication list with you to all your follow-up doctor's appointments.

## 2020-09-15 NOTE — NC FL2 (Signed)
[place] LEVEL OF CARE SCREENING TOOL     IDENTIFICATION  Patient Name: Amy [name] Birthdate: [birth year] Sex: female Admission Date (Current Location): 08/04/2020  [facility] and [place] Number:  Engineering geologist and Address:  [facility], [place] [address] [place], [place] [address]      Provider Number: [id number]  Attending Physician Name and Address:  Jamse Arn, MD  Relative Name and Phone Number:  Naelani Lafrance [phone]    Current Level of Care: Other (Comment) (Rehab) Recommended Level of Care: [place] Prior Approval Number:    Date Approved/Denied:   PASRR Number: [id number]  Discharge Plan: SNF    Current Diagnoses: Patient Active Problem List   Diagnosis Date Noted  . CAD (coronary artery disease) of artery bypass graft 09/15/2020  . History of nausea and vomiting 09/15/2020  . Sleep disturbance   . SDH (subdural hematoma) ([place]) 08/07/2020  . Acute on chronic anemia   . Subdural hemorrhage following injury ([place]) 08/04/2020  . Orthostasis   . Gastroparesis   . Chronic combined systolic and diastolic congestive heart failure ([place])   . Prolonged Q-T interval on ECG   . Nausea   . Hypoalbuminemia due to protein-calorie malnutrition ([place])   . Nausea and vomiting   . Chronic combined systolic and diastolic CHF (congestive heart failure) ([place])   . Anemia of chronic disease   . Seizures ([place])   . Chronic pain syndrome   . Debility 07/04/2020  . Ischemic cardiomyopathy   . Protein-calorie malnutrition, severe 06/23/2020  . Non-ST elevation (NSTEMI) myocardial infarction ([place])   . Pressure injury of skin 06/21/2020  . C. difficile diarrhea 06/19/2020  . C. difficile colitis 06/19/2020  . Seizure ([place]) 06/19/2020  . Gastroenteritis due to COVID-19 virus 05/29/2020  . Surgery, elective   . S/P ORIF (open reduction internal fixation) fracture   . Closed right hip fracture, initial  encounter ([place]) 04/22/2020  . Hypotension 12/16/2019  . ESRD on dialysis ([place]) 12/16/2019  . Depression with anxiety 12/16/2019  . Hypomagnesemia 12/16/2019  . Atrial flutter ([place])   . Atrial fibrillation with RVR ([place]) 04/03/2019  . Benign essential HTN 04/03/2019  . HLD (hyperlipidemia) 04/03/2019  . End stage renal disease ([place]) 12/13/2018  . Itching 11/08/2018  . Hyperparathyroidism, secondary renal ([place]) 09/13/2017  . Ground glass opacity present on imaging of lung 04/08/2017  . Osteoarthritis of knee 12/29/2016  . Normocytic anemia 10/14/2016  . Chronic headaches   . Gastritis   . History of adenomatous polyp of colon 09/07/2016  . Chronic abdominal pain 09/05/2016  . Iron deficiency anemia 09/05/2016  . Hyponatremia 09/05/2016  . Multiple thyroid nodules 07/04/2015  . Right thyroid nodule 06/27/2015  . Allergic rhinitis, seasonal 12/03/2014  . Fever blister 12/03/2014  . Herniated lumbar intervertebral disc 12/03/2014  . Hypokalemia 12/03/2014  . Recurrent sinus infections 12/03/2014  . Insomnia 11/05/2014  . Acquired scoliosis 06/26/2014  . Intervertebral disc stenosis of neural canal of lumbar region 06/20/2014  . Lumbar facet arthropathy 05/21/2014  . Degenerative spondylolisthesis 03/12/2014  . OP (osteoporosis) 08/10/2013  . Idiopathic peripheral neuropathy 03/24/2009  . Edema 12/27/2007  . Headache, tension-type 10/25/2006  . Essential hypertension 10/24/2006  . Anxiety 05/03/1998  . Esophageal reflux 05/03/1998    Orientation RESPIRATION BLADDER Height & Weight     Self,Time,Situation,Place  Normal Continent Weight: 105 lb 13.1 oz (48 kg) Height:  5\' 4"  (162.6 cm)  BEHAVIORAL SYMPTOMS/MOOD NEUROLOGICAL BOWEL  NUTRITION STATUS    Convulsions/Seizures (Had one on admission to [facility] 07/24/2020) Continent Diet (regular diet thin liquids)  AMBULATORY STATUS COMMUNICATION OF NEEDS Skin   Extensive Assist Verbally Normal                       Personal  Care Assistance Level of Assistance  Bathing,Dressing Bathing Assistance: Limited assistance   Dressing Assistance: Limited assistance     Functional Limitations Info             SPECIAL CARE FACTORS FREQUENCY  PT (By licensed PT),OT (By licensed OT),Speech therapy     PT Frequency: 5 x week OT Frequency: 5 x week     Speech Therapy Frequency: 5 x week      Contractures Contractures Info: Not present    Additional Factors Info  Code Status,Allergies Code Status Info: Full Code Allergies Info: Cephalosparins, Clarithromycin, Ensure-nepro, Gabapentin, Pencillin, Sulfa Antibiotics           Current Medications (09/15/2020):  This is the current hospital active medication list Current Facility-Administered Medications  Medication Dose Route Frequency Provider Last Rate Last Admin  . acetaminophen (TYLENOL) tablet 325-650 mg  325-650 mg Oral Q4H PRN Bary Leriche, PA-C   650 mg at 09/14/20 7989  . aspirin EC tablet 81 mg  81 mg Oral Daily Bary Leriche, PA-C   81 mg at 09/15/20 0816  . atorvastatin (LIPITOR) tablet 40 mg  40 mg Oral Daily Bary Leriche, PA-C   40 mg at 09/15/20 0816  . bisacodyl (DULCOLAX) suppository 10 mg  10 mg Rectal Daily PRN Love, Pamela S, PA-C      . brivaracetam (BRIVIACT) tablet 75 mg  75 mg Oral BID Reesa Chew S, PA-C   75 mg at 09/15/20 0816  . butalbital-acetaminophen-caffeine (FIORICET) 50-325-40 MG per tablet 1 tablet  1 tablet Oral TID PRN Bary Leriche, PA-C   1 tablet at 09/15/20 0816  . camphor-menthol (SARNA) lotion   Topical PRN Bary Leriche, PA-C   Given at 09/10/20 1643  . Chlorhexidine Gluconate Cloth 2 % PADS 6 each  6 each Topical Daily Jamse Arn, MD   6 each at 09/15/20 0518  . Darbepoetin Alfa (ARANESP) injection 60 mcg  60 mcg Intravenous Q Sat-HD Roney Jaffe, MD   60 mcg at 09/13/20 1807  . docusate sodium (COLACE) capsule 100 mg  100 mg Oral BID Bary Leriche, PA-C   100 mg at 09/15/20 0816  . gentamicin  cream (GARAMYCIN) 0.1 % 1 application  1 application Topical Daily Janalee Dane, PA-C   1 application at 21/19/41 7408  . guaiFENesin-dextromethorphan (ROBITUSSIN DM) 100-10 MG/5ML syrup 5-10 mL  5-10 mL Oral Q6H PRN Love, Pamela S, PA-C      . heparin injection 5,000 Units  5,000 Units Subcutaneous Q8H Monia Sabal, PA-C   5,000 Units at 09/15/20 0518  . kidney failure book   Does not apply Once Jamse Arn, MD      . lacosamide (VIMPAT) tablet 50 mg  50 mg Oral BID [name], MD   50 mg at 09/15/20 0815  . LORazepam (ATIVAN) injection 2 mg  2 mg Intravenous PRN [name], MD      . melatonin tablet 5 mg  5 mg Oral QHS Love, Pamela S, PA-C   5 mg at 09/14/20 2145  . melatonin tablet 5 mg  5 mg Oral QHS PRN Reesa Chew  S, PA-C   5 mg at 09/14/20 2315  . methocarbamol (ROBAXIN) tablet 500 mg  500 mg Oral Q6H PRN Bary Leriche, PA-C   500 mg at 09/14/20 2315  . metoCLOPramide (REGLAN) tablet 5 mg  5 mg Oral QPC breakfast Jamse Arn, MD   5 mg at 09/15/20 0816  . metoprolol tartrate (LOPRESSOR) 25 mg/10 mL oral suspension 6.25 mg  6.25 mg Oral BID Love, Pamela S, PA-C   6.25 mg at 09/15/20 8032  . multivitamin (RENA-VIT) tablet 1 tablet  1 tablet Oral QHS Penninger, [place], [place]   1 tablet at 09/14/20 2145  . pantoprazole (PROTONIX) EC tablet 40 mg  40 mg Oral Daily Bary Leriche, PA-C   40 mg at 09/15/20 0815  . polyethylene glycol (MIRALAX / GLYCOLAX) packet 17 g  17 g Oral Daily PRN Bary Leriche, PA-C   17 g at 08/25/20 0735  . prochlorperazine (COMPAZINE) tablet 5 mg  5 mg Oral Q8H PRN Jamse Arn, MD   5 mg at 09/07/20 0100   Or  . prochlorperazine (COMPAZINE) injection 5 mg  5 mg Intramuscular Q8H PRN Jamse Arn, MD       Or  . prochlorperazine (COMPAZINE) suppository 12.5 mg  12.5 mg Rectal Q8H PRN Jamse Arn, MD      . traZODone (DESYREL) tablet 25-50 mg  25-50 mg Oral QHS PRN Bary Leriche, PA-C   50 mg at 09/14/20 2145  .  triamcinolone (KENALOG) 0.1 % cream   Topical TID Bary Leriche, PA-C   Given at 09/15/20 1224     Discharge Medications: Please see discharge summary for a list of discharge medications.  Relevant Imaging Results:  Relevant Lab Results:   Additional Information SSN: [id number] Switched to HD will start at [facility] location T,TH Sat at 10:30 Am. Has had COVID vaccinations  Torian Quintero, Gardiner Rhyme, LCSW

## 2020-09-15 NOTE — Progress Notes (Signed)
[facility] Progress Note   Subjective:   Patient seen and examined at bedside.  No new complaints.  Excited to be leaving the hospital today.  Starts dialysis tomorrow at outpatient unit.   Objective Vitals:   09/14/20 0534 09/14/20 1324 09/14/20 1935 09/15/20 0504  BP: 137/76 137/68 (!) 142/84 (!) 149/87  Pulse: 83 88 89 90  Resp: 17 18 18 17   Temp: 98.1 F (36.7 C) 98.4 F (36.9 C) 98.5 F (36.9 C) 98 F (36.7 C)  TempSrc:      SpO2: 97% 99% 98% 98%  Weight: 48.6 kg   48 kg  Height:       Physical Exam General: chronically appearing female in NAD Heart:RRR, no mrg Lungs:CTAB, nml WOB Abdomen:soft, NTND Extremities:trace edema on LLE, 1+ on RLE Dialysis Access: R IJ [facility]   Filed Weights   09/13/20 0611 09/14/20 0534 09/15/20 0504  Weight: 49.5 kg 48.6 kg 48 kg    Intake/Output Summary (Last 24 hours) at 09/15/2020 1021 Last data filed at 09/15/2020 0830 Gross per 24 hour  Intake 895 ml  Output --  Net 895 ml    Additional Objective Labs: Basic Metabolic Panel: Recent Labs  Lab 09/11/20 1406 09/12/20 0546 09/13/20 1511  NA 125* 127* 124*  K 3.7 3.5 3.3*  CL 95* 97* 94*  CO2 25 26 25   GLUCOSE 121* 83 133*  BUN 16 7* 14  CREATININE 2.65* 1.52* 2.58*  CALCIUM 8.1* 7.8* 8.1*  PHOS 2.9 2.2* 2.4*   Liver Function Tests: Recent Labs  Lab 09/11/20 1406 09/12/20 0546 09/13/20 1511  ALBUMIN 1.5* 1.3* 1.4*   CBC: Recent Labs  Lab 09/10/20 0453 09/11/20 1406 09/13/20 1511 09/15/20 0751  WBC 4.2 7.1 8.0 6.7  HGB 9.5* 10.6* 10.2* 11.2*  HCT 29.6* 32.5* 30.8* 33.8*  MCV 100.0 98.2 98.1 97.7  PLT 171 230 221 222    Medications:  . aspirin EC  81 mg Oral Daily  . atorvastatin  40 mg Oral Daily  . brivaracetam  75 mg Oral BID  . Chlorhexidine Gluconate Cloth  6 each Topical Daily  . COVID-19 mRNA vaccine (Moderna)  0.25 mL Intramuscular Once  . darbepoetin (ARANESP) injection - DIALYSIS  60 mcg Intravenous Q Sat-HD  . docusate  sodium  100 mg Oral BID  . gentamicin cream  1 application Topical Daily  . heparin  5,000 Units Subcutaneous Q8H  . kidney failure book   Does not apply Once  . lacosamide  50 mg Oral BID  . melatonin  5 mg Oral QHS  . metoCLOPramide  5 mg Oral QPC breakfast  . metoprolol tartrate  6.25 mg Oral BID  . multivitamin  1 tablet Oral QHS  . pantoprazole  40 mg Oral Daily  . triamcinolone cream   Topical TID    Dialysis Orders: [place] followed by Dr. Candiss Norse ([place]). 4 exchanges overnight, 2000 cc and 1.5 hrs each->transitioned to HD. Renal care manager arranging OP HD plans and PD cath flushes. Please note: Last PD catheter flush inpatient was completed on 09/11/20.  Assessment/Plan: 1.ESRD: Previously on [facility], [place], f/b Dr Candiss Norse of [place].Bloody effluent seen on takedown 4/15and again 4/28 and 4/29. Not receiving heparin with PD.PD gram stain, cultures and cell count negative forinfection.D/t plan for SNF, she was transitioned to HD. S/p TDC placement 5/5 with HD afterwards -> Was started on MWF schedule but nowhas outpatient spot [facility] 10:45 am seat time. Plans for discharge  today 5/16. Will continue on TTS schedule until discharge.Last HD on 09/13/20 tolerated in recliner.  2. Hypokalemia:chronic while on PD but improved on HD. Use increase K bath.  Last K 3.3.  3.HTN/volume: BP stable. Significant overload on admit, which significantly improved. Some LE edema noted on exam today.  EDW appears to be around 43kg.  4.Fall w/ subdural hematoma: S/pSDH [facility] on[facility]24[facility]22 5. Anemia of ESRD: Hgbnow 11.4. Not on ESA at this time.  6.MBD: Corrected Ca ok, phos low. No binder or VDRA, follow. Liberalized diet.  7.Nausea/ vomiting: Intermittent, using Reglan TID. 8. Malnutrition/ debility: Alb very low. Previously had refused changing to HD, now agreeable since will be going to SNF.Has not tolerated protein  supplements. 9.Multivessel CAD/HFrEF (25-30% LHC 06/2020):Per TCTSwasnotCABG candidate. UnderwentPCI to LAD. At [facility] repeat echo per family showed improved EF 55%. 10. Chronichyponatremia: Stable and euvolemic appearing. Off Na tabs.SSRI d/c'd. Pt has chronically low Na+ levels, reset osmostat most likely. Not vol related. Would not treat unless Na< 125, and then would give NaCl tabs 1-2 gm bid for goal Na+ > 128- 135 range. 11. Metabolic acidosis:normalized since starting HD.  Bicarb tablets have been d/c.  12.HxSeizures-per primary team- vimpat was reduced 4/29 for side effects.  33 Debility: 2/2 Multiple med issues, most recently SDH recovery and NSTEMI w/ CAD and low EF. Readmitted to CIR on 08/04/20.Now planning discharge to SNF today and continue HD at outpatient unit as mentioned above.    Jen Mow, PA-C [facility] 09/15/2020,10:21 AM  LOS: 42 days

## 2020-09-15 NOTE — Plan of Care (Signed)
  Problem: RH Balance Goal: LTG Patient will maintain dynamic sitting balance (PT) Description: LTG:  Patient will maintain dynamic sitting balance with assistance during mobility activities (PT) Outcome: Completed/Met Goal: LTG Patient will maintain dynamic standing balance (PT) Description: LTG:  Patient will maintain dynamic standing balance with assistance during mobility activities (PT) Outcome: Not Met (add Reason) Note: Needing up to max A at times for dynamic standing activities   Problem: Sit to Stand Goal: LTG:  Patient will perform sit to stand with assistance level (PT) Description: LTG:  Patient will perform sit to stand with assistance level (PT) Outcome: Completed/Met   Problem: RH Bed Mobility Goal: LTG Patient will perform bed mobility with assist (PT) Description: LTG: Patient will perform bed mobility with assistance, with/without cues (PT). Outcome: Not Met (add Reason) Note: Still needs min A for bed mobility   Problem: RH Bed to Chair Transfers Goal: LTG Patient will perform bed/chair transfers w/assist (PT) Description: LTG: Patient will perform bed to chair transfers with assistance (PT). Outcome: Completed/Met   Problem: RH Ambulation Goal: LTG Patient will ambulate in controlled environment (PT) Description: LTG: Patient will ambulate in a controlled environment, # of feet with assistance (PT). Outcome: Not Met (add Reason) Note: Has walked up to 8' with mod A with RW Goal: LTG Patient will ambulate in home environment (PT) Description: LTG: Patient will ambulate in home environment, # of feet with assistance (PT). Outcome: Not Met (add Reason) Note: Has walked up to 8' with mod A with RW   Problem: RH Wheelchair Mobility Goal: LTG Patient will propel w/c in controlled environment (PT) Description: LTG: Patient will propel wheelchair in controlled environment, # of feet with assist (PT) Outcome: Not Met (add Reason) Note: Limited to 50-60' due to  fatigue  Magda Kiel, PT

## 2020-09-15 NOTE — Progress Notes (Signed)
Physical Therapy Discharge Summary  Patient Details  Name: OZETTA FLATLEY MRN: [id number] Date of Birth: [birth year]   Patient has met 3 of 8 long term goals due to improved activity tolerance, improved balance and increased strength.  Patient to discharge at a wheelchair level [place].   Patient's care partner patient discharging to SNF to provide the necessary physical assistance at discharge.  Reasons goals not met: Patient with fluctuating participation due to nausea, new HD and port placed with some pain at site.  Goals were downgraded to mod A overall and at times can function with mod A but not consistently.  Recommendation:  Patient will benefit from ongoing skilled PT services in skilled nursing facility setting to continue to advance safe functional mobility, address ongoing impairments in balance, activity tolerance, strength, gait and transfers, and minimize fall risk.  Equipment: No equipment provided  Reasons for discharge: discharge from hospital  Patient/family agrees with progress made and goals achieved: Yes  PT Discharge Precautions/Restrictions Precautions Precautions: Fall Precaution Comments: seizure precautions Vital Signs  Pain Pain Assessment Pain Scale: 0-10 Faces Pain Scale: No hurt Pain Type: Chronic pain Pain Location: Head Pain Descriptors / Indicators: Headache Pain Onset: On-going Pain Intervention(s): Distraction Vision/Perception  Perception Perception: Within Functional Limits Praxis Praxis: Intact  Cognition Overall Cognitive Status: Impaired/Different from baseline Arousal/Alertness: Awake/alert Orientation Level: Oriented X4 Attention: Sustained Sustained Attention: Impaired Sustained Attention Impairment: Verbal basic;Functional basic Memory: Impaired Memory Impairment: Decreased recall of new information;Storage deficit;Retrieval deficit Awareness: Impaired Awareness Impairment: Emergent impairment Problem Solving:  Impaired Problem Solving Impairment: Verbal complex;Functional basic;Functional complex;Verbal basic (basic to mildly complex) Safety/Judgment: Appears intact Sensation Sensation Light Touch: Appears Intact Hot/Cold: Appears Intact Coordination Gross Motor Movements are Fluid and Coordinated: No Fine Motor Movements are Fluid and Coordinated: No Coordination and Movement Description: limited by weakness and some mild dysmetria Motor  Motor Motor: Other (comment) Motor - Discharge Observations: generalized weakness, decreased activity tolerance, checking BP and wrapping legs  Mobility Bed Mobility Bed Mobility: Rolling Right;Rolling Left;Supine to Sit;Sit to Supine;Scooting to [facility] Rolling Right: Supervision/verbal cueing Rolling Left: Supervision/Verbal cueing Right Sidelying to Sit: Supervision/Verbal cueing Supine to Sit: Minimal Assistance - Patient > 75% (with rails) Sit to Supine: Minimal Assistance - Patient > 75% Scooting to HOB: Minimal Assistance - Patient > 75% (with rails) Transfers Transfers: Sit to [employer] Transfers Sit to Stand: Moderate Assistance - Patient 50-74% Stand to Sit: Moderate Assistance - Patient 50-74% Squat Pivot Transfers: Moderate Assistance - Patient 50-74% Transfer (Assistive device): Rolling walker Locomotion  Gait Ambulation: Yes Gait Assistance: Maximal Assistance - Patient 25-49% Gait Distance (Feet): 8 Feet Assistive device: Rolling walker Gait Assistance Details: Verbal cues for sequencing;Verbal cues for technique;Verbal cues for safe use of DME/AE;Verbal cues for precautions/safety Gait Gait: Yes Gait Pattern: Impaired Gait Pattern: Step-to pattern;Shuffle;Poor foot clearance - right;Decreased stride length;Decreased dorsiflexion - left;Decreased dorsiflexion - right;Lateral trunk lean to right;Decreased trunk rotation Stairs / Additional Locomotion Stairs: No Wheelchair Mobility Wheelchair Mobility: Yes Wheelchair  Assistance: Chartered loss adjuster: Both upper extremities Wheelchair Parts Management: Needs assistance Distance: 59'  Trunk/Postural Assessment  Cervical Assessment Cervical Assessment: Exceptions to [facility] (forward head) Thoracic Assessment Thoracic Assessment: Exceptions to [facility] (rounded shoulders) Lumbar Assessment Lumbar Assessment: Exceptions to [facility] (posterior tilt, R lateral lean) Postural Control Postural Control: Deficits on evaluation Righting Reactions: self corrects but with delay  Balance Static Sitting Balance Static Sitting - Balance Support: Feet supported;No upper extremity supported Static Sitting - Level of Assistance: 5:  Stand by assistance Dynamic Sitting Balance Dynamic Sitting - Balance Support: Feet supported Dynamic Sitting - Level of Assistance: 5: Stand by assistance Dynamic Sitting - Balance Activities: Lateral lean/weight shifting;Forward lean/weight shifting;Reaching across midline;Reaching for objects Static Standing Balance Static Standing - Balance Support: Bilateral upper extremity supported Static Standing - Level of Assistance: 3: Mod assist Dynamic Standing Balance Dynamic Standing - Balance Support: Bilateral upper extremity supported Dynamic Standing - Level of Assistance: 2: Max assist Dynamic Standing - Balance Activities: Other (comment) Dynamic Standing - Comments: ambulation Extremity Assessment      RLE Assessment RLE Assessment: Exceptions to [facility] Active Range of Motion (AROM) Comments: AROM WFL General Strength Comments: Strength hip flexion 3-/5, knee extension 4-/5, ankle DF 3/5 LLE Assessment LLE Assessment: Exceptions to [facility] Active Range of Motion (AROM) Comments: AROM WFL General Strength Comments: Hip flexion 3+/5, knee extension 4/5, ankle DF 4-/5    Jamison Oka, PT 09/15/2020, 12:43 PM

## 2020-09-15 NOTE — Plan of Care (Signed)
  Problem: RH Balance Goal: LTG Patient will maintain dynamic standing with ADLs (OT) Description: LTG:  Patient will maintain dynamic standing balance with assist during activities of daily living (OT)  Outcome: Not Met (add Reason) Note: Goal not met due to patient's decline in functional mobility, general medical complications, fatigue, and decline in motivation.   Problem: Sit to Stand Goal: LTG:  Patient will perform sit to stand in prep for activites of daily living with assistance level (OT) Description: LTG:  Patient will perform sit to stand in prep for activites of daily living with assistance level (OT) Outcome: Not Met (add Reason) Note: Goal not met due to patient's decline in functional mobility, general medical complications, fatigue, and decline in motivation. Requires mod-max A.   Problem: RH Bathing Goal: LTG Patient will bathe all body parts with assist levels (OT) Description: LTG: Patient will bathe all body parts with assist levels (OT) Outcome: Not Met (add Reason) Note: Goal not met due to patient's decline in functional mobility, general medical complications, fatigue, and decline in motivation. Requires mod-max A at d/c.   Problem: RH Dressing Goal: LTG Patient will perform upper body dressing (OT) Description: LTG Patient will perform upper body dressing with assist, with/without cues (OT). Outcome: Not Met (add Reason) Note: Goal not met due to patient's decline in functional mobility, general medical complications, fatigue, and decline in motivation. Requires min A at d/c. Goal: LTG Patient will perform lower body dressing w/assist (OT) Description: LTG: Patient will perform lower body dressing with assist, with/without cues in positioning using equipment (OT) Outcome: Not Met (add Reason) Note: Goal not met due to patient's decline in functional mobility, general medical complications, fatigue, and decline in motivation. Requires mod-max A at d/c.   Problem:  RH Toileting Goal: LTG Patient will perform toileting task (3/3 steps) with assistance level (OT) Description: LTG: Patient will perform toileting task (3/3 steps) with assistance level (OT)  Outcome: Not Met (add Reason) Note: Goal not met due to patient's decline in functional mobility, general medical complications, fatigue, and decline in motivation. Mod-max A required at d/c.   Problem: RH Toilet Transfers Goal: LTG Patient will perform toilet transfers w/assist (OT) Description: LTG: Patient will perform toilet transfers with assist, with/without cues using equipment (OT) Outcome: Not Met (add Reason) Note: Goal not met due to patient's decline in functional mobility, general medical complications, fatigue, and decline in motivation. Mod-max A required at d/c.

## 2020-09-15 NOTE — Progress Notes (Signed)
Occupational Therapy Discharge Summary  Patient Details  Name: Amy [name] MRN: [id number] Date of Birth: August 30, 1950  Today's Date: 09/15/2020 OT Individual Time: 0800-0830 OT Individual Time Calculation (min): 30 min   Pt received for 30 minute OT session this AM supine and sleeping in bed. Following arousal, pt declining OOB or EOB activity despite encouragement and therapeutic listening. Pt agreed to grooming and UB dressing laying in bed; refused donning ace wraps on BLEs. Pt washed face set up A. UB dressing min A; pt sat upright in bed using bilateral bedrails to don shirt and sat unsupported for <1 min. Pt refused LBD tasks despite dressing for next PT session and d/c. Pt left with staff in room, writing in memory notebook, and call bell in reach.  Patient has met 4 of 11 long term goals due to improved activity tolerance, improved balance and functional use of  RIGHT upper extremity.  Patient to discharge at overall Mod Assist level.  Patient's care partner will be SNF staff and able  to provide the necessary physical assistance at discharge.    Reasons goals not met: Patient with decreased endurance, motivation, and varying medical states significantly limited pt from achieving goals of supervision-min A. Pt's current assist levels at d/c can fluctuate based on pt's fatigue, nausea, and motivation, but assist levels inconsistent.  Recommendation:  Patient will benefit from ongoing skilled OT services in skilled nursing facility setting to continue to advance functional skills in the area of BADL and Reduce care partner burden.  Equipment: No equipment provided  Reasons for discharge: discharge from hospital  Patient/family agrees with progress made and goals achieved: Yes  OT Discharge Precautions/Restrictions  Precautions Precautions: Fall Precaution Comments: seizure precautions Pain Pain Assessment Faces Pain Scale: No hurt ADL ADL Eating: Set up Where  Assessed-Eating: Bed level Grooming: Setup Where Assessed-Grooming: Sitting at sink Upper Body Bathing: Setup Where Assessed-Upper Body Bathing: Sitting at sink Lower Body Bathing: Maximal assistance Where Assessed-Lower Body Bathing: Sitting at sink,Standing at sink,Wheelchair Upper Body Dressing: Minimal assistance Where Assessed-Upper Body Dressing: Wheelchair Lower Body Dressing: Maximal assistance Where Assessed-Lower Body Dressing: Sitting at sink,Standing at sink,Wheelchair Toileting: Dependent Where Assessed-Toileting: Bedside Commode,Toilet Toilet Transfer: Maximal assistance,Moderate verbal cueing Toilet Transfer Method: Stand pivot Science writer: Bedside commode,Raised toilet seat,Grab bars Tub/Shower Transfer: Not assessed,Other (comment) (Pt taking sponge baths at d/c) Vision Baseline Vision/History: No visual deficits Vision Assessment?: No apparent visual deficits Perception  Perception: Within Functional Limits Praxis Praxis: Intact Cognition Overall Cognitive Status: Impaired/Different from baseline Arousal/Alertness: Awake/alert Orientation Level: Oriented X4 Attention: Sustained Sustained Attention: Impaired Sustained Attention Impairment: Verbal basic;Functional basic Memory: Impaired Memory Impairment: Decreased recall of new information;Storage deficit;Retrieval deficit Awareness: Impaired Awareness Impairment: Emergent impairment Problem Solving: Impaired Problem Solving Impairment: Verbal complex;Functional basic;Functional complex;Verbal basic (basic to mildly complex) Safety/Judgment: Appears intact Sensation Sensation Light Touch: Appears Intact Hot/Cold: Appears Intact Coordination Gross Motor Movements are Fluid and Coordinated: No Fine Motor Movements are Fluid and Coordinated: No Coordination and Movement Description: limited by weakness and some mild dysmetria Motor  Motor Motor: Other (comment) Motor - Discharge  Observations: generalized weakness, decreased activity tolerance, checking BP and wrapping legs Mobility  Bed Mobility Bed Mobility: Rolling Right;Rolling Left;Supine to Sit;Sit to Supine;Scooting to [facility] Rolling Right: Supervision/verbal cueing Rolling Left: Supervision/Verbal cueing Right Sidelying to Sit: Supervision/Verbal cueing Supine to Sit: Minimal Assistance - Patient > 75% (with rails) Sit to Supine: Minimal Assistance - Patient > 75% Scooting to [facility]: Minimal Assistance - Patient >  75% (with rails) Transfers Sit to Stand: Moderate Assistance - Patient 50-74% Stand to Sit: Moderate Assistance - Patient 50-74%  Trunk/Postural Assessment  Cervical Assessment Cervical Assessment: Exceptions to [facility] (forward head) Thoracic Assessment Thoracic Assessment: Exceptions to [facility] (rounded shoulders) Lumbar Assessment Lumbar Assessment: Exceptions to [facility] (posterior tilt, R lateral lean) Postural Control Postural Control: Deficits on evaluation Righting Reactions: self corrects but with delay  Balance Static Sitting Balance Static Sitting - Balance Support: Feet supported;No upper extremity supported Static Sitting - Level of Assistance: 5: Stand by assistance Dynamic Sitting Balance Dynamic Sitting - Balance Support: Feet supported Dynamic Sitting - Level of Assistance: 5: Stand by assistance Dynamic Sitting - Balance Activities: Lateral lean/weight shifting;Forward lean/weight shifting;Reaching across midline;Reaching for objects Static Standing Balance Static Standing - Balance Support: Bilateral upper extremity supported Static Standing - Level of Assistance: 3: Mod assist Dynamic Standing Balance Dynamic Standing - Balance Support: Bilateral upper extremity supported Dynamic Standing - Level of Assistance: 2: Max assist Dynamic Standing - Balance Activities: Other (comment) Dynamic Standing - Comments: ambulation Extremity/Trunk Assessment RUE Assessment RUE Assessment: Within  Functional Limits Passive Range of Motion (PROM) Comments: [facility] General Strength Comments: general muscle weakness overall     Mellissa Kohut 09/15/2020, 3:15 PM

## 2020-09-15 NOTE — Progress Notes (Signed)
Speech Language Pathology Discharge Summary  Patient Details  Name: Amy [name] MRN: [id number] Date of Birth: 07/04/1950  Today's Date: 09/15/2020 SLP Individual Time: 1230-1300 SLP Individual Time Calculation (min): 30 min   Skilled Therapeutic Interventions: Skilled ST services focused on cognitive skills and education. SLP administer formal cognitive assessment, SLUMs pt scored 12 out 30 (n=>27) indicating severe deficits, primarily due to memory deficits. SLP provided education pertaining to acute recall, sustained attention, basic-mildly complex problem solving and error awareness deficits and is agreeable to continue ST services at next level of care. Pt was left with call bell within reach and bed alarm set.    Patient has met 1 of 4 long term goals.  Patient to discharge at overall Min;Supervision level.  Reasons goals not met: poor carryover and low frustration tolerance   Clinical Impression/Discharge Summary:   Pt made moderate progress meeting 1 out 4 goals, due to continued slow processing and poor carryover due to short term recall deficits. Pt demonstrated improvements in orientation, increased sustained attention, increase basic-mildly complex problem solving, error awareness within task and use of external memory strategies, including a memory notebook. Pt benefited form skilled ST services in order to maximize functional independence and reduce burden of care requiring, 24 hour supervision and continue ST services.   Care Partner:  Caregiver Able to Provide Assistance: Yes  Type of Caregiver Assistance: Physical;Cognitive  Recommendation:  24 hour supervision/assistance;Skilled Nursing facility  Rationale for SLP Follow Up: Maximize functional communication;Maximize cognitive function and independence;Reduce caregiver burden   Equipment: N/A   Reasons for discharge: Discharged from hospital   Patient/Family Agrees with Progress Made and Goals Achieved: Yes     Delenn Ahn  [facility] 09/15/2020, 3:53 PM

## 2020-09-15 NOTE — Progress Notes (Signed)
Physical Therapy Session Note  Patient Details  Name: Amy [name] MRN: [id number] Date of Birth: 1951/03/09  Today's Date: 09/15/2020 PT Individual Time: 0830-0930 PT Individual Time Calculation (min): 60 min   Short Term Goals: Week 6:  PT Short Term Goal 1 (Week 6): STG=LTG due to ELOS  Skilled Therapeutic Interventions/Progress Updates:    Patient in supine and despite encouragement refusing OOB or EOB activity.  Patient agreed to in bed therex and performed as noted below.  Requesting bed pan to urinate.  Patient rolling with S using rail and placed pan.  Total A for hygiene, placing clean brief.  Patient scooting up in bed with rails and min A.  Patient left with call bell in reach and bed alarm active.  Therapy Documentation Precautions:  Precautions Precautions: Fall Precaution Comments: seizure precautions Restrictions Weight Bearing Restrictions: No Other Position/Activity Restrictions: head incision Pain: Pain Assessment Pain Scale: 0-10 Pain Score: 4  Faces Pain Scale: Hurts little more Pain Type: Chronic pain Pain Location: Head Pain Descriptors / Indicators: Headache Pain Frequency: Intermittent Pain Onset: On-going Pain Intervention(s): Distraction   Exercises: General Exercises - Lower Extremity Ankle Circles/Pumps: AROM;15 reps;Supine;Both Short Arc Quad: Strengthening;Both;10 reps;Supine (5 sec hold) Heel Slides: Strengthening;Both;10 reps;Supine Hip ABduction/ADduction: Strengthening;Both;10 reps;Supine Hip Flexion/Marching: Strengthening;Both;10 reps;Supine (hooklying) Total Joint Exercises Bridges: Strengthening;Both;10 reps;Supine (5 sec hold) Other Exercises Other Exercises: lateral trunk rotation x 10 Other Exercises: core strengthening trunk flexion from semireclined in bed x 10   Therapy/Group: Individual Therapy  Reginia Naas  Magda Kiel, PT 09/15/2020, 11:23 AM

## 2020-09-15 NOTE — Progress Notes (Signed)
Patient ID: Amy [name], female   DOB: 01-01-1951, 70 y.o.   MRN: [id number] Zacarias Pontes asking if pt has had the booster for COVID, according to the chart she has not. Pam-PA has ordered one and charge RN aware will need before can be discharged.

## 2020-09-15 NOTE — Progress Notes (Signed)
[place] PHYSICAL MEDICINE & REHABILITATION PROGRESS NOTE  Subjective/Complaints: Patient seen sitting up in bed this AM.  She states she slept well overnight.  She states she is ready for discharge. She was seen by Nephro, notes reviewed - ready for d/c.  ROS: Denies CP, SOB, N/V/D  Objective: Vital Signs: Blood pressure (!) 149/87, pulse 90, temperature 98 F (36.7 C), resp. rate 17, height 5\' 4"  (1.626 m), weight 48 kg, SpO2 98 %. No results found. Recent Labs    09/13/20 1511 09/15/20 0751  WBC 8.0 6.7  HGB 10.2* 11.2*  HCT 30.8* 33.8*  PLT 221 222   Recent Labs    09/13/20 1511  NA 124*  K 3.3*  CL 94*  CO2 25  GLUCOSE 133*  BUN 14  CREATININE 2.58*  CALCIUM 8.1*    Intake/Output Summary (Last 24 hours) at 09/15/2020 1258 Last data filed at 09/15/2020 0830 Gross per 24 hour  Intake 895 ml  Output --  Net 895 ml     Pressure Injury 09/06/20 Sacrum Medial Stage 2 -  Partial thickness loss of dermis presenting as a shallow open injury with a red, pink wound bed without slough. small opening- redness around (Active)  09/06/20 2001  Location: Sacrum  Location Orientation: Medial  Staging: Stage 2 -  Partial thickness loss of dermis presenting as a shallow open injury with a red, pink wound bed without slough.  Wound Description (Comments): small opening- redness around  Present on Admission:     Physical Exam: BP (!) 149/87   Pulse 90   Temp 98 F (36.7 C)   Resp 17   Ht 5\' 4"  (1.626 m)   Wt 48 kg   SpO2 98%   BMI 18.16 kg/m   Constitutional: No distress . Vital signs reviewed. HENT: Normocephalic.  Atraumatic. Eyes: EOMI. No discharge. Cardiovascular: No JVD.  RRR. Respiratory: Normal effort.  No stridor.  Bilateral clear to auscultation. GI: Non-distended.  BS +. Skin: Warm and dry.  Intact. Psych: Normal mood.  Normal behavior. Musc: No edema in extremities.  No tenderness in extremities. Neuro: Motor: LUE: 4/5 proximal distal, stable RUE:  4/5 proximal distal LLE: Hip flexion 4/5, knee extension 4/5, ankle dorsiflexion 4-/5, unchanged RLE: Hip flexion 4/5, knee extension 4/5, ankle dorsiflexion 3-3+/5, unchanged  Assessment/Plan: 1. Functional deficits which require 3+ hours per day of interdisciplinary therapy in a comprehensive inpatient rehab setting.  Physiatrist is providing close team supervision and 24 hour management of active medical problems listed below.  Physiatrist and rehab team continue to assess barriers to discharge/monitor patient progress toward functional and medical goals   Care Tool:  Bathing    Body parts bathed by patient: Chest,Abdomen,Front perineal area,Face   Body parts bathed by helper: Buttocks     Bathing assist Assist Level: Moderate Assistance - Patient 50 - 74%     Upper Body Dressing/Undressing Upper body dressing   What is the patient wearing?: Pull over shirt    Upper body assist Assist Level: Minimal Assistance - Patient > 75%    Lower Body Dressing/Undressing Lower body dressing      What is the patient wearing?: Pants     Lower body assist Assist for lower body dressing: Maximal Assistance - Patient 25 - 49%     Toileting Toileting    Toileting assist Assist for toileting: Total Assistance - Patient < 25%     Transfers Chair/bed transfer  Transfers assist     Chair/bed transfer assist  level: Moderate Assistance - Patient 50 - 74% Chair/bed transfer assistive device: Programmer, multimedia   Ambulation assist   Ambulation activity did not occur: Safety/medical concerns  Assist level: Moderate Assistance - Patient 50 - 74% Assistive device: Walker-rolling Max distance: 5'   Walk 10 feet activity   Assist  Walk 10 feet activity did not occur: Safety/medical concerns  Assist level: Moderate Assistance - Patient - 50 - 74% Assistive device: Walker-rolling   Walk 50 feet activity   Assist Walk 50 feet with 2 turns activity did not  occur: Safety/medical concerns         Walk 150 feet activity   Assist Walk 150 feet activity did not occur: Safety/medical concerns         Walk 10 feet on uneven surface  activity   Assist Walk 10 feet on uneven surfaces activity did not occur: Safety/medical concerns   Assist level: Moderate Assistance - Patient - 50 - 74% Assistive device: Aeronautical engineer Will patient use wheelchair at discharge?: Yes Type of Wheelchair: Manual    Wheelchair assist level: Supervision/Verbal cueing Max wheelchair distance: 60'    Wheelchair 50 feet with 2 turns activity    Assist    Wheelchair 50 feet with 2 turns activity did not occur: Safety/medical concerns   Assist Level: Supervision/Verbal cueing   Wheelchair 150 feet activity     Assist  Wheelchair 150 feet activity did not occur: Safety/medical concerns        Medical Problem List and Plan: 1.  Right hemiparesis and aphasia secondary to large left SDH s/p craniotomy  DC today  Will see patient for hospital follow up in 1 month post-discharge 2.  Impaired mobility -DVT/anticoagulation:  Mechanical: Continue Sequential compression devices, below knee Bilateral lower extremities             -antiplatelet therapy: N/A due to bleed.  3. Chronic migraines/ Pain Management: Continue Fioricet 3-4 times a day on average. Added prn Fioricet q8H  Controlled on 5/16  Monitor with increased exertion 4. Mood: LCSW to follow for evaluation and support.  -antipsychotic agents: N/A 5. Neuropsych: This patient is not fully capable of making decisions on her own behalf. 6. Skin/Wound Care: Monitor wound for healing.             -Family to provide meals from home as able.  -bilateral PRAFOs ordered but patient refuses to wear 7. Fluids/Electrolytes/Nutrition:              --has had chronic issues with variable intake as well as N/V likely due to gastroparesis.  8. Chronic Hypotension:  Continue TEDs when out of bed             Vitals:   09/14/20 1935 09/15/20 0504  BP: (!) 142/84 (!) 149/87  Pulse: 89 90  Resp: 18 17  Temp: 98.5 F (36.9 C) 98 F (36.7 C)  SpO2: 98% 98%   Relatively controlled on 5/16 9.  ESRD   Transitioned from PD to HD.  HD cath placed on 5/5 10.  Recurrent Seizures: Has been seizure free, continue Brivaracetam and Lacosamide bid.              --Lacosamide was decreased on 03/31 to avoid sedative SE, decreased again on 4/28- appreciate neurology recommendations..  No breakthrough seizures since admission- 5/16 11. CAD/NSTEMI s/p PCI: Treated medically.   Off DAPT due to bleed   Continue Lipitor.  Metoprolol low-dose resumed  Heart rate controlled on 5/16 12. Acute on chronic anemia:   Hemoglobin 10.6 on 5/13  Continue to monitor 13. H/o Depression: Continue Celexa 14. Sick Euthyroid: Will need follow up as illness resolves.              --TSH- 5.80/Free T4-1.2 (03/27) 15. Gastroparesis w/chronic N/V/anorexia: Has been ongoing per records review.              --question failure of PD (husband reports no N/V with brief episode of HD in the past)  Continue Reglan 5 3 times daily, decreased to daily on 5/13  Hesitant to continue increase medications due to #18  Improved overall 16.  Hyponatremia             Chronic hyponatremia with Na-130-133 range  Recs per nephro             --Continue Sodium bicarb to manage metabolic acidosis. 17.  Severe hypoalbuminemia  Supplement d/ced given nausea and vomiting- recommended nuts as source of protein. Her daughter has brought in.   5/7- nephrology giving albumin today.  18.  Prolonged QTC  ECG reviewed, improved since last visit  Compazine and dose/frequency decreased 19.  Sleep disturbance- "never a good sleeper"  Cont trazodone and melatonin    Likely at baseline 20.  Hypokalemia  Potassium 3.9 on 5/11  Scheduled supplement DC'd, monitor.  21. Leukocytosis: Resolved  WBCs 4.3 on  5/11  Afebrile  No signs/symptoms of infection  Cont to monitor  > 30 minutes spent in total in discharge planning between myself and PA regarding aforementioned, as well discussion regarding follow-up appointments, discharge medications, discharge recommendations, answering questions.  Please see discharge summary as well.  LOS: 42 days A FACE TO FACE EVALUATION WAS PERFORMED  Athziri Freundlich Lorie Phenix 09/15/2020, 12:58 PM

## 2020-09-16 ENCOUNTER — Encounter: Payer: Self-pay | Admitting: Nephrology

## 2020-09-16 DIAGNOSIS — I2510 Atherosclerotic heart disease of native coronary artery without angina pectoris: Secondary | ICD-10-CM | POA: Diagnosis not present

## 2020-09-16 DIAGNOSIS — N186 End stage renal disease: Secondary | ICD-10-CM | POA: Diagnosis not present

## 2020-09-16 DIAGNOSIS — S065X0A Traumatic subdural hemorrhage without loss of consciousness, initial encounter: Secondary | ICD-10-CM | POA: Diagnosis not present

## 2020-09-16 DIAGNOSIS — K219 Gastro-esophageal reflux disease without esophagitis: Secondary | ICD-10-CM | POA: Diagnosis not present

## 2020-09-16 DIAGNOSIS — G4089 Other seizures: Secondary | ICD-10-CM | POA: Diagnosis not present

## 2020-09-16 DIAGNOSIS — I10 Essential (primary) hypertension: Secondary | ICD-10-CM | POA: Diagnosis not present

## 2020-09-18 DIAGNOSIS — N186 End stage renal disease: Secondary | ICD-10-CM | POA: Diagnosis not present

## 2020-09-18 DIAGNOSIS — D509 Iron deficiency anemia, unspecified: Secondary | ICD-10-CM | POA: Diagnosis not present

## 2020-09-18 DIAGNOSIS — Z992 Dependence on renal dialysis: Secondary | ICD-10-CM | POA: Diagnosis not present

## 2020-09-18 DIAGNOSIS — D631 Anemia in chronic kidney disease: Secondary | ICD-10-CM | POA: Diagnosis not present

## 2020-09-18 DIAGNOSIS — N2581 Secondary hyperparathyroidism of renal origin: Secondary | ICD-10-CM | POA: Diagnosis not present

## 2020-09-19 DIAGNOSIS — N2581 Secondary hyperparathyroidism of renal origin: Secondary | ICD-10-CM | POA: Diagnosis not present

## 2020-09-19 DIAGNOSIS — D631 Anemia in chronic kidney disease: Secondary | ICD-10-CM | POA: Diagnosis not present

## 2020-09-19 DIAGNOSIS — N186 End stage renal disease: Secondary | ICD-10-CM | POA: Diagnosis not present

## 2020-09-19 DIAGNOSIS — D509 Iron deficiency anemia, unspecified: Secondary | ICD-10-CM | POA: Diagnosis not present

## 2020-09-19 DIAGNOSIS — Z992 Dependence on renal dialysis: Secondary | ICD-10-CM | POA: Diagnosis not present

## 2020-09-22 DIAGNOSIS — N2581 Secondary hyperparathyroidism of renal origin: Secondary | ICD-10-CM | POA: Diagnosis not present

## 2020-09-22 DIAGNOSIS — N186 End stage renal disease: Secondary | ICD-10-CM | POA: Diagnosis not present

## 2020-09-22 DIAGNOSIS — D631 Anemia in chronic kidney disease: Secondary | ICD-10-CM | POA: Diagnosis not present

## 2020-09-22 DIAGNOSIS — D509 Iron deficiency anemia, unspecified: Secondary | ICD-10-CM | POA: Diagnosis not present

## 2020-09-22 DIAGNOSIS — Z992 Dependence on renal dialysis: Secondary | ICD-10-CM | POA: Diagnosis not present

## 2020-09-26 DIAGNOSIS — D631 Anemia in chronic kidney disease: Secondary | ICD-10-CM | POA: Diagnosis not present

## 2020-09-26 DIAGNOSIS — Z992 Dependence on renal dialysis: Secondary | ICD-10-CM | POA: Diagnosis not present

## 2020-09-26 DIAGNOSIS — N2581 Secondary hyperparathyroidism of renal origin: Secondary | ICD-10-CM | POA: Diagnosis not present

## 2020-09-26 DIAGNOSIS — D509 Iron deficiency anemia, unspecified: Secondary | ICD-10-CM | POA: Diagnosis not present

## 2020-09-26 DIAGNOSIS — N186 End stage renal disease: Secondary | ICD-10-CM | POA: Diagnosis not present

## 2020-09-29 DIAGNOSIS — N2581 Secondary hyperparathyroidism of renal origin: Secondary | ICD-10-CM | POA: Diagnosis not present

## 2020-09-29 DIAGNOSIS — D509 Iron deficiency anemia, unspecified: Secondary | ICD-10-CM | POA: Diagnosis not present

## 2020-09-29 DIAGNOSIS — D631 Anemia in chronic kidney disease: Secondary | ICD-10-CM | POA: Diagnosis not present

## 2020-09-29 DIAGNOSIS — Z992 Dependence on renal dialysis: Secondary | ICD-10-CM | POA: Diagnosis not present

## 2020-09-29 DIAGNOSIS — N186 End stage renal disease: Secondary | ICD-10-CM | POA: Diagnosis not present

## 2020-09-30 DIAGNOSIS — Z992 Dependence on renal dialysis: Secondary | ICD-10-CM | POA: Diagnosis not present

## 2020-09-30 DIAGNOSIS — N186 End stage renal disease: Secondary | ICD-10-CM | POA: Diagnosis not present

## 2020-10-01 DIAGNOSIS — N2581 Secondary hyperparathyroidism of renal origin: Secondary | ICD-10-CM | POA: Diagnosis not present

## 2020-10-01 DIAGNOSIS — Z992 Dependence on renal dialysis: Secondary | ICD-10-CM | POA: Diagnosis not present

## 2020-10-01 DIAGNOSIS — D631 Anemia in chronic kidney disease: Secondary | ICD-10-CM | POA: Diagnosis not present

## 2020-10-01 DIAGNOSIS — N186 End stage renal disease: Secondary | ICD-10-CM | POA: Diagnosis not present

## 2020-10-01 DIAGNOSIS — D509 Iron deficiency anemia, unspecified: Secondary | ICD-10-CM | POA: Diagnosis not present

## 2020-10-03 DIAGNOSIS — D631 Anemia in chronic kidney disease: Secondary | ICD-10-CM | POA: Diagnosis not present

## 2020-10-03 DIAGNOSIS — D509 Iron deficiency anemia, unspecified: Secondary | ICD-10-CM | POA: Diagnosis not present

## 2020-10-03 DIAGNOSIS — N2581 Secondary hyperparathyroidism of renal origin: Secondary | ICD-10-CM | POA: Diagnosis not present

## 2020-10-03 DIAGNOSIS — N186 End stage renal disease: Secondary | ICD-10-CM | POA: Diagnosis not present

## 2020-10-03 DIAGNOSIS — Z992 Dependence on renal dialysis: Secondary | ICD-10-CM | POA: Diagnosis not present

## 2020-10-06 DIAGNOSIS — N2581 Secondary hyperparathyroidism of renal origin: Secondary | ICD-10-CM | POA: Diagnosis not present

## 2020-10-06 DIAGNOSIS — Z992 Dependence on renal dialysis: Secondary | ICD-10-CM | POA: Diagnosis not present

## 2020-10-06 DIAGNOSIS — N186 End stage renal disease: Secondary | ICD-10-CM | POA: Diagnosis not present

## 2020-10-06 DIAGNOSIS — D631 Anemia in chronic kidney disease: Secondary | ICD-10-CM | POA: Diagnosis not present

## 2020-10-06 DIAGNOSIS — D509 Iron deficiency anemia, unspecified: Secondary | ICD-10-CM | POA: Diagnosis not present

## 2020-10-08 DIAGNOSIS — D509 Iron deficiency anemia, unspecified: Secondary | ICD-10-CM | POA: Diagnosis not present

## 2020-10-08 DIAGNOSIS — D631 Anemia in chronic kidney disease: Secondary | ICD-10-CM | POA: Diagnosis not present

## 2020-10-08 DIAGNOSIS — N186 End stage renal disease: Secondary | ICD-10-CM | POA: Diagnosis not present

## 2020-10-08 DIAGNOSIS — N2581 Secondary hyperparathyroidism of renal origin: Secondary | ICD-10-CM | POA: Diagnosis not present

## 2020-10-08 DIAGNOSIS — Z992 Dependence on renal dialysis: Secondary | ICD-10-CM | POA: Diagnosis not present

## 2020-10-10 ENCOUNTER — Encounter (INDEPENDENT_AMBULATORY_CARE_PROVIDER_SITE_OTHER): Payer: Self-pay | Admitting: Vascular Surgery

## 2020-10-10 ENCOUNTER — Ambulatory Visit (INDEPENDENT_AMBULATORY_CARE_PROVIDER_SITE_OTHER): Payer: Medicare Other | Admitting: Vascular Surgery

## 2020-10-10 ENCOUNTER — Other Ambulatory Visit: Payer: Self-pay

## 2020-10-10 VITALS — BP 146/87 | HR 81 | Ht 64.0 in | Wt 116.0 lb

## 2020-10-10 DIAGNOSIS — N186 End stage renal disease: Secondary | ICD-10-CM | POA: Diagnosis not present

## 2020-10-10 DIAGNOSIS — D631 Anemia in chronic kidney disease: Secondary | ICD-10-CM | POA: Diagnosis not present

## 2020-10-10 DIAGNOSIS — M7989 Other specified soft tissue disorders: Secondary | ICD-10-CM | POA: Diagnosis not present

## 2020-10-10 DIAGNOSIS — I10 Essential (primary) hypertension: Secondary | ICD-10-CM | POA: Diagnosis not present

## 2020-10-10 DIAGNOSIS — E43 Unspecified severe protein-calorie malnutrition: Secondary | ICD-10-CM

## 2020-10-10 DIAGNOSIS — Z992 Dependence on renal dialysis: Secondary | ICD-10-CM

## 2020-10-10 DIAGNOSIS — E785 Hyperlipidemia, unspecified: Secondary | ICD-10-CM

## 2020-10-10 DIAGNOSIS — N2581 Secondary hyperparathyroidism of renal origin: Secondary | ICD-10-CM | POA: Diagnosis not present

## 2020-10-10 DIAGNOSIS — D509 Iron deficiency anemia, unspecified: Secondary | ICD-10-CM | POA: Diagnosis not present

## 2020-10-10 DIAGNOSIS — I5042 Chronic combined systolic (congestive) and diastolic (congestive) heart failure: Secondary | ICD-10-CM | POA: Diagnosis not present

## 2020-10-10 NOTE — Assessment & Plan Note (Signed)
Significantly worsens her swelling.

## 2020-10-10 NOTE — Progress Notes (Signed)
Patient ID: Amy [name], female   DOB: [birth year], 70 y.o.   MRN: [id number]  Chief Complaint  Patient presents with   New Patient (Initial Visit)    NP weeping edema from arm     HPI Amy [name] is a 70 y.o. female.  I am asked to see the patient by Dr. Candiss Norse for evaluation of arm swelling.  She has a right jugular PermCath.  Is not clear to me how long this has been in, as we took a PermCath out a little over a year ago.  She has had weeping from her arms.  Both arms have been affected.  However, at this time the swelling in her arms is really fairly mild.  She has much more swelling in her legs.  She has weeping and ulceration in the right lower leg and just pronounced swelling in the left lower leg.  Her mobility has been poor recently and her legs are dependent much of the time.  She has multiple medical issues as described below.     Past Medical History:  Diagnosis Date   Anemia    a. 08/2016 Guaiac + stool. EGD w/ gastritis.   Anxiety    Closed fracture of shaft of left ulna 11/27/2018   Colon polyp    a. 08/2016 Colonoscopy.   ESRD on peritoneal dialysis ([facility])    Gastritis    a. 08/2016 EGD: Gastritis-->PPI.   GERD (gastroesophageal reflux disease)    History of chicken pox    History of measles as a child    Hypertension    Hyponatremia    a. 08/2016 in setting of HCTZ Rx.   Leukocytosis    Mesenteric lymphadenopathy 04/08/2017   Multiple thyroid nodules     Past Surgical History:  Procedure Laterality Date   ABDOMINAL HYSTERECTOMY  2003   due to heavy periods and clotting during menses   BREAST LUMPECTOMY Left 1990   CHOLECYSTECTOMY  2010   COLONOSCOPY WITH PROPOFOL N/A 09/07/2016   Procedure: COLONOSCOPY WITH PROPOFOL;  Surgeon: Lucilla Lame, MD;  Location: [facility];  Service: Endoscopy;  Laterality: N/A;   CORONARY STENT INTERVENTION N/A 06/27/2020   Procedure: CORONARY STENT INTERVENTION;  Surgeon: Sherren Mocha, MD;  Location: [facility];   Service: Cardiovascular;  Laterality: N/A;   DIALYSIS/PERMA CATHETER INSERTION N/A 11/16/2018   Procedure: DIALYSIS/PERMA CATHETER INSERTION;  Surgeon: Algernon Huxley, MD;  Location: [facility];  Service: Cardiovascular;  Laterality: N/A;   DIALYSIS/PERMA CATHETER REMOVAL N/A 08/16/2019   Procedure: DIALYSIS/PERMA CATHETER REMOVAL;  Surgeon: Algernon Huxley, MD;  Location: [facility];  Service: Cardiovascular;  Laterality: N/A;   DILATION AND CURETTAGE OF UTERUS  1985   ESOPHAGOGASTRODUODENOSCOPY (EGD) WITH PROPOFOL N/A 09/07/2016   Procedure: ESOPHAGOGASTRODUODENOSCOPY (EGD) WITH PROPOFOL;  Surgeon: Lucilla Lame, MD;  Location: [facility];  Service: Endoscopy;  Laterality: N/A;   GALLBLADDER SURGERY  2012   INTRAMEDULLARY (IM) NAIL INTERTROCHANTERIC Right 04/23/2020   Procedure: INTRAMEDULLARY (IM) NAIL INTERTROCHANTRIC;  Surgeon: Thornton Park, MD;  Location: [facility];  Service: Orthopedics;  Laterality: Right;   INTRAVASCULAR IMAGING/OCT N/A 06/27/2020   Procedure: INTRAVASCULAR IMAGING/OCT;  Surgeon: Sherren Mocha, MD;  Location: [facility];  Service: Cardiovascular;  Laterality: N/A;   IR FLUORO GUIDE CV LINE RIGHT  09/04/2020   IR US GUIDE VASC ACCESS RIGHT  09/04/2020   KNEE SURGERY     Dr. Tamala Julian   LEFT HEART CATH AND CORONARY  ANGIOGRAPHY N/A 06/25/2020   Procedure: LEFT HEART CATH AND CORONARY ANGIOGRAPHY;  Surgeon: Troy Sine, MD;  Location: [facility];  Service: Cardiovascular;  Laterality: N/A;   TONSILLECTOMY     TONSILLECTOMY AND ADENOIDECTOMY  1970   UPPER GI ENDOSCOPY  03/02/2006   H. Pylori negative; Dr. Allen Norris     Family History  Problem Relation Age of Onset   Pancreatic cancer Father    Congestive Heart Failure Maternal Grandmother    Cancer Paternal Grandfather    Heart disease Maternal Uncle       Social History   Tobacco Use   Smoking status: Never   Smokeless tobacco: Never  Vaping Use   Vaping Use: Never used   Substance Use Topics   Alcohol use: Yes    Alcohol/week: 0.0 - 2.0 standard drinks   Drug use: No     Allergies  Allergen Reactions   Cephalosporins Hives and Swelling   Clarithromycin Other (See Comments)    Unknown reaction   Ensure [Nutritional Supplements] Nausea And Vomiting   Gabapentin    Lunesta  [Eszopiclone]     Heart racing   Nepro [Compleat] Nausea And Vomiting   Penicillin V     Has patient had a PCN reaction causing immediate rash, facial/tongue/throat swelling, SOB or lightheadedness with hypotension: Yes Has patient had a PCN reaction causing severe rash involving mucus membranes or skin necrosis: No Has patient had a PCN reaction that required hospitalization No Has patient had a PCN reaction occurring within the last 10 years: No If all of the above answers are "NO", then may proceed with Cephalosporin use.   Sulfa Antibiotics     Unknown reaction    Current Outpatient Medications  Medication Sig Dispense Refill   acetaminophen (TYLENOL) 325 MG tablet Take 1-2 tablets (325-650 mg total) by mouth every 4 (four) hours as needed for mild pain.     aspirin 81 MG chewable tablet Chew 1 tablet (81 mg total) by mouth daily.     atorvastatin (LIPITOR) 40 MG tablet Take 1 tablet (40 mg total) by mouth daily.     atorvastatin (LIPITOR) 40 MG tablet Take by mouth.     brivaracetam (BRIVIACT) 25 MG TABS tablet Take 3 tablets (75 mg total) by mouth 2 (two) times daily. 60 tablet    butalbital-acetaminophen-caffeine (FIORICET) 50-325-40 MG tablet Take 1 tablet by mouth 3 (three) times daily as needed for headache. 15 tablet 0   camphor-menthol (SARNA) lotion Apply topically as needed for itching. 222 mL 0   Chlorhexidine Gluconate Cloth 2 % PADS Apply 6 each topically daily.     Darbepoetin Alfa (ARANESP) 60 MCG/0.3ML SOSY injection Inject 0.3 mLs (60 mcg total) into the vein every Saturday with hemodialysis. 4.2 mL    docusate sodium (COLACE) 100 MG capsule Take 1 capsule  (100 mg total) by mouth 2 (two) times daily. 10 capsule 0   gentamicin cream (GARAMYCIN) 0.1 % Apply 1 application topically daily. 30 g 0   lacosamide (VIMPAT) 50 MG TABS tablet Take 1 tablet (50 mg total) by mouth 2 (two) times daily. 60 tablet    Melatonin 3 MG TBDP Take by mouth.     melatonin 5 MG TABS Take 1 tablet (5 mg total) by mouth at bedtime.  0   melatonin 5 MG TABS Take 1 tablet (5 mg total) by mouth at bedtime as needed.  0   methocarbamol (ROBAXIN) 500 MG tablet Take 1 tablet (500 mg  total) by mouth every 6 (six) hours as needed for muscle spasms.     metoCLOPramide (REGLAN) 5 MG tablet Take 1 tablet (5 mg total) by mouth daily after breakfast.     metoprolol tartrate (LOPRESSOR) 25 mg/10 mL SUSP Take 2.5 mLs (6.25 mg total) by mouth 2 (two) times daily.     multivitamin (RENA-VIT) TABS tablet Take 1 tablet by mouth at bedtime.  0   pantoprazole (PROTONIX) 40 MG tablet TAKE 1 TABLET (40 MG TOTAL) BY MOUTH DAILY. 30 tablet 1   prochlorperazine (COMPAZINE) 10 MG tablet Take 1 tablet (10 mg total) by mouth every 8 (eight) hours as needed for nausea or vomiting. 30 tablet 0   traZODone (DESYREL) 50 MG tablet Take 0.5-1 tablets (25-50 mg total) by mouth at bedtime as needed for sleep.     triamcinolone cream (KENALOG) 0.1 % Apply topically 3 (three) times daily. 30 g 0   No current facility-administered medications for this visit.      REVIEW OF SYSTEMS (Negative unless checked)  Constitutional: [] Weight loss  [] Fever  [] Chills Cardiac: [] Chest pain   [] Chest pressure   [] Palpitations   [] Shortness of breath when laying flat   [] Shortness of breath at rest   [] Shortness of breath with exertion. Vascular:  [] Pain in legs with walking   [] Pain in legs at rest   [] Pain in legs when laying flat   [] Claudication   [] Pain in feet when walking  [] Pain in feet at rest  [] Pain in feet when laying flat   [] History of DVT   [] Phlebitis   [] Swelling in legs   [] Varicose veins   [] Non-healing  ulcers Pulmonary:   [] Uses home oxygen   [] Productive cough   [] Hemoptysis   [] Wheeze  [] COPD   [] Asthma Neurologic:  [] Dizziness  [] Blackouts   [] Seizures   [] History of stroke   [] History of TIA  [] Aphasia   [] Temporary blindness   [] Dysphagia   [] Weakness or numbness in arms   [] Weakness or numbness in legs Musculoskeletal:  [x] Arthritis   [] Joint swelling   [x] Joint pain   [] Low back pain Hematologic:  [] Easy bruising  [] Easy bleeding   [] Hypercoagulable state   [] Anemic  [] Hepatitis Gastrointestinal:  [] Blood in stool   [] Vomiting blood  [] Gastroesophageal reflux/heartburn   [] Abdominal pain Genitourinary:  [x] Chronic kidney disease   [] Difficult urination  [] Frequent urination  [] Burning with urination   [] Hematuria Skin:  [] Rashes   [] Ulcers   [] Wounds Psychological:  [] History of anxiety   []  History of major depression.    Physical Exam BP (!) 146/87   Pulse 81   Ht 5\' 4"  (1.626 m)   Wt 116 lb (52.6 kg)   BMI 19.91 kg/m  Gen: Frail, chronically ill-appearing NAD Head: [place]/AT, No temporalis wasting.  Ear/Nose/Throat: Hearing grossly intact, nares w/o erythema or drainage, oropharynx w/o Erythema/Exudate Eyes: Conjunctiva clear, sclera non-icteric  Neck: trachea midline.  No JVD.  Pulmonary:  Good air movement, respirations not labored, no use of accessory muscles  Cardiac: Irregular Vascular:  Vessel Right Left  Radial Palpable Palpable           Musculoskeletal: M/S 5/5 throughout.  Extremities without ischemic changes.  No deformity or atrophy.  1+ right upper extremity edema, trace left upper extremity edema, 2-3+ bilateral lower extremity edema.  Weeping serous fluid from the right lower leg.  No clear weeping from the arms at this time although she says it has been there previously. Neurologic: Sensation grossly intact in  extremities.  Symmetrical.  Speech is fluent. Motor exam as listed above. Psychiatric: Judgment intact, Mood & affect appropriate for pt's clinical  situation. Dermatologic: Weeping from the right lower leg    Radiology No results found.  Labs Recent Results (from the past 2160 hour(s))  CBC with Differential/Platelet     Status: Abnormal   Collection Time: 07/14/20  4:53 AM  Result Value Ref Range   WBC 7.1 4.0 - 10.5 K/uL   RBC 3.19 (L) 3.87 - 5.11 MIL/uL   Hemoglobin 10.4 (L) 12.0 - 15.0 g/dL   HCT 30.6 (L) 36.0 - 46.0 %   MCV 95.9 80.0 - 100.0 fL   MCH 32.6 26.0 - 34.0 pg   MCHC 34.0 30.0 - 36.0 g/dL   RDW 22.0 (H) 11.5 - 15.5 %   Platelets 252 150 - 400 K/uL   nRBC 0.0 0.0 - 0.2 %   Neutrophils Relative % 65 %   Neutro Abs 4.6 1.7 - 7.7 K/uL   Lymphocytes Relative 19 %   Lymphs Abs 1.4 0.7 - 4.0 K/uL   Monocytes Relative 11 %   Monocytes Absolute 0.8 0.1 - 1.0 K/uL   Eosinophils Relative 4 %   Eosinophils Absolute 0.3 0.0 - 0.5 K/uL   Basophils Relative 1 %   Basophils Absolute 0.1 0.0 - 0.1 K/uL   Immature Granulocytes 0 %   Abs Immature Granulocytes 0.03 0.00 - 0.07 K/uL    Comment: Performed at [facility], [address] [place], [place] [address]  Renal function panel     Status: Abnormal   Collection Time: 07/16/20  6:10 AM  Result Value Ref Range   Sodium 128 (L) 135 - 145 mmol/L   Potassium 3.5 3.5 - 5.1 mmol/L   Chloride 92 (L) 98 - 111 mmol/L   CO2 23 22 - 32 mmol/L   Glucose, Bld 80 70 - 99 mg/dL    Comment: Glucose reference range applies only to samples taken after fasting for at least 8 hours.   BUN 17 8 - 23 mg/dL   Creatinine, Ser 4.14 (H) 0.44 - 1.00 mg/dL   Calcium 7.8 (L) 8.9 - 10.3 mg/dL   Phosphorus 3.4 2.5 - 4.6 mg/dL   Albumin 1.6 (L) 3.5 - 5.0 g/dL   GFR, Estimated 11 (L) >60 mL/min    Comment: (NOTE) Calculated using the CKD-EPI Creatinine Equation (2021)    Anion gap 13 5 - 15    Comment: Performed at [facility] [address], [place], [place] [address]  Renal function panel     Status: Abnormal   Collection Time: 07/17/20  5:20 AM  Result Value Ref Range    Sodium 127 (L) 135 - 145 mmol/L   Potassium 3.8 3.5 - 5.1 mmol/L    Comment: SLIGHT HEMOLYSIS   Chloride 92 (L) 98 - 111 mmol/L   CO2 22 22 - 32 mmol/L   Glucose, Bld 79 70 - 99 mg/dL    Comment: Glucose reference range applies only to samples taken after fasting for at least 8 hours.   BUN 18 8 - 23 mg/dL   Creatinine, Ser 4.14 (H) 0.44 - 1.00 mg/dL   Calcium 7.8 (L) 8.9 - 10.3 mg/dL   Phosphorus 3.7 2.5 - 4.6 mg/dL   Albumin 1.5 (L) 3.5 - 5.0 g/dL   GFR, Estimated 11 (L) >60 mL/min    Comment: (NOTE) Calculated using the CKD-EPI Creatinine Equation (2021)    Anion gap 13  5 - 15    Comment: Performed at [facility], [place] [address], [place], [place] [address]  Renal function panel     Status: Abnormal   Collection Time: 07/18/20  4:55 AM  Result Value Ref Range   Sodium 127 (L) 135 - 145 mmol/L   Potassium 3.7 3.5 - 5.1 mmol/L   Chloride 93 (L) 98 - 111 mmol/L   CO2 24 22 - 32 mmol/L   Glucose, Bld 86 70 - 99 mg/dL    Comment: Glucose reference range applies only to samples taken after fasting for at least 8 hours.   BUN 18 8 - 23 mg/dL   Creatinine, Ser 4.13 (H) 0.44 - 1.00 mg/dL   Calcium 7.5 (L) 8.9 - 10.3 mg/dL   Phosphorus 3.6 2.5 - 4.6 mg/dL   Albumin 1.5 (L) 3.5 - 5.0 g/dL   GFR, Estimated 11 (L) >60 mL/min    Comment: (NOTE) Calculated using the CKD-EPI Creatinine Equation (2021)    Anion gap 10 5 - 15    Comment: Performed at [facility] [address] [place], [place] [address]  CBC     Status: Abnormal   Collection Time: 07/18/20  4:55 AM  Result Value Ref Range   WBC 6.5 4.0 - 10.5 K/uL   RBC 2.95 (L) 3.87 - 5.11 MIL/uL   Hemoglobin 9.7 (L) 12.0 - 15.0 g/dL   HCT 28.5 (L) 36.0 - 46.0 %   MCV 96.6 80.0 - 100.0 fL   MCH 32.9 26.0 - 34.0 pg   MCHC 34.0 30.0 - 36.0 g/dL   RDW 21.6 (H) 11.5 - 15.5 %   Platelets 215 150 - 400 K/uL   nRBC 0.0 0.0 - 0.2 %    Comment: Performed at [facility], [place] [address] [place], [place] [address]   Renal function panel     Status: Abnormal   Collection Time: 07/19/20  4:51 AM  Result Value Ref Range   Sodium 126 (L) 135 - 145 mmol/L   Potassium 3.6 3.5 - 5.1 mmol/L   Chloride 92 (L) 98 - 111 mmol/L   CO2 23 22 - 32 mmol/L   Glucose, Bld 80 70 - 99 mg/dL    Comment: Glucose reference range applies only to samples taken after fasting for at least 8 hours.   BUN 17 8 - 23 mg/dL   Creatinine, Ser 4.06 (H) 0.44 - 1.00 mg/dL   Calcium 7.5 (L) 8.9 - 10.3 mg/dL   Phosphorus 3.4 2.5 - 4.6 mg/dL   Albumin 1.5 (L) 3.5 - 5.0 g/dL   GFR, Estimated 11 (L) >60 mL/min    Comment: (NOTE) Calculated using the CKD-EPI Creatinine Equation (2021)    Anion gap 11 5 - 15    Comment: Performed at [facility] [address] [place], [place] [address]  Renal function panel     Status: Abnormal   Collection Time: 07/20/20  4:47 AM  Result Value Ref Range   Sodium 125 (L) 135 - 145 mmol/L   Potassium 3.8 3.5 - 5.1 mmol/L   Chloride 92 (L) 98 - 111 mmol/L   CO2 18 (L) 22 - 32 mmol/L   Glucose, Bld 80 70 - 99 mg/dL    Comment: Glucose reference range applies only to samples taken after fasting for at least 8 hours.   BUN 17 8 - 23 mg/dL   Creatinine, Ser 3.88 (H) 0.44 - 1.00 mg/dL   Calcium  7.4 (L) 8.9 - 10.3 mg/dL   Phosphorus 3.5 2.5 - 4.6 mg/dL   Albumin 1.4 (L) 3.5 - 5.0 g/dL   GFR, Estimated 12 (L) >60 mL/min    Comment: (NOTE) Calculated using the CKD-EPI Creatinine Equation (2021)    Anion gap 15 5 - 15    Comment: Performed at [facility] [address] [place], [place] [address]  Renal function panel     Status: Abnormal   Collection Time: 07/21/20  6:19 AM  Result Value Ref Range   Sodium 127 (L) 135 - 145 mmol/L   Potassium 3.8 3.5 - 5.1 mmol/L   Chloride 94 (L) 98 - 111 mmol/L   CO2 22 22 - 32 mmol/L   Glucose, Bld 89 70 - 99 mg/dL    Comment: Glucose reference range applies only to samples taken after fasting for at least 8 hours.   BUN 17 8 - 23 mg/dL    Creatinine, Ser 4.00 (H) 0.44 - 1.00 mg/dL   Calcium 7.8 (L) 8.9 - 10.3 mg/dL   Phosphorus 3.3 2.5 - 4.6 mg/dL   Albumin 1.5 (L) 3.5 - 5.0 g/dL   GFR, Estimated 11 (L) >60 mL/min    Comment: (NOTE) Calculated using the CKD-EPI Creatinine Equation (2021)    Anion gap 11 5 - 15    Comment: Performed at [facility] [address] [place], [place] [address]  Renal function panel     Status: Abnormal   Collection Time: 07/22/20  5:40 AM  Result Value Ref Range   Sodium 128 (L) 135 - 145 mmol/L   Potassium 4.3 3.5 - 5.1 mmol/L   Chloride 93 (L) 98 - 111 mmol/L   CO2 24 22 - 32 mmol/L   Glucose, Bld 83 70 - 99 mg/dL    Comment: Glucose reference range applies only to samples taken after fasting for at least 8 hours.   BUN 22 8 - 23 mg/dL   Creatinine, Ser 3.82 (H) 0.44 - 1.00 mg/dL   Calcium 7.9 (L) 8.9 - 10.3 mg/dL   Phosphorus 3.5 2.5 - 4.6 mg/dL   Albumin 1.6 (L) 3.5 - 5.0 g/dL   GFR, Estimated 12 (L) >60 mL/min    Comment: (NOTE) Calculated using the CKD-EPI Creatinine Equation (2021)    Anion gap 11 5 - 15    Comment: Performed at [facility] [address] [place],  [address]  CBC with Differential/Platelet     Status: Abnormal   Collection Time: 07/22/20  5:40 AM  Result Value Ref Range   WBC 10.0 4.0 - 10.5 K/uL   RBC 3.89 3.87 - 5.11 MIL/uL   Hemoglobin 12.8 12.0 - 15.0 g/dL   HCT 37.4 36.0 - 46.0 %   MCV 96.1 80.0 - 100.0 fL   MCH 32.9 26.0 - 34.0 pg   MCHC 34.2 30.0 - 36.0 g/dL   RDW 20.4 (H) 11.5 - 15.5 %   Platelets 296 150 - 400 K/uL   nRBC 0.0 0.0 - 0.2 %   Neutrophils Relative % 76 %   Neutro Abs 7.6 1.7 - 7.7 K/uL   Lymphocytes Relative 11 %   Lymphs Abs 1.1 0.7 - 4.0 K/uL   Monocytes Relative 8 %   Monocytes Absolute 0.8 0.1 - 1.0 K/uL   Eosinophils Relative 4 %   Eosinophils Absolute 0.4 0.0 - 0.5 K/uL   Basophils Relative 1 %   Basophils Absolute 0.1 0.0 - 0.1 K/uL  Immature Granulocytes 0 %   Abs Immature Granulocytes 0.04  0.00 - 0.07 K/uL    Comment: Performed at [facility], [place] [address], [place], [place] [address]  Renal function panel     Status: Abnormal   Collection Time: 07/23/20  4:54 AM  Result Value Ref Range   Sodium 127 (L) 135 - 145 mmol/L   Potassium 4.0 3.5 - 5.1 mmol/L   Chloride 92 (L) 98 - 111 mmol/L   CO2 23 22 - 32 mmol/L   Glucose, Bld 86 70 - 99 mg/dL    Comment: Glucose reference range applies only to samples taken after fasting for at least 8 hours.   BUN 24 (H) 8 - 23 mg/dL   Creatinine, Ser 3.96 (H) 0.44 - 1.00 mg/dL   Calcium 7.9 (L) 8.9 - 10.3 mg/dL   Phosphorus 3.6 2.5 - 4.6 mg/dL   Albumin 1.6 (L) 3.5 - 5.0 g/dL   GFR, Estimated 12 (L) >60 mL/min    Comment: (NOTE) Calculated using the CKD-EPI Creatinine Equation (2021)    Anion gap 12 5 - 15    Comment: Performed at [facility] [address] [place], [place] [address]  Culture, Urine     Status: Abnormal   Collection Time: 07/23/20  4:00 PM   Specimen: Urine, Random  Result Value Ref Range   Specimen Description URINE, RANDOM    Special Requests NONE    Culture (A)     <10,000 COLONIES/mL INSIGNIFICANT GROWTH Performed at [facility] [address] [place], [place] [address]    Report Status 07/25/2020 FINAL   Urinalysis, Routine w reflex microscopic Urine, Catheterized     Status: None   Collection Time: 07/23/20  4:00 PM  Result Value Ref Range   Color, Urine YELLOW YELLOW   APPearance CLEAR CLEAR   Specific Gravity, Urine 1.017 1.005 - 1.030   pH 5.0 5.0 - 8.0   Glucose, UA NEGATIVE NEGATIVE mg/dL   Hgb urine dipstick NEGATIVE NEGATIVE   Bilirubin Urine NEGATIVE NEGATIVE   Ketones, ur NEGATIVE NEGATIVE mg/dL   Protein, ur NEGATIVE NEGATIVE mg/dL   Nitrite NEGATIVE NEGATIVE   Leukocytes,Ua NEGATIVE NEGATIVE    Comment: Performed at [facility] [address] [place], [place] [address]  Culture, blood (routine x 2)     Status: None   Collection Time: 07/23/20  5:39 PM    Specimen: BLOOD RIGHT HAND  Result Value Ref Range   Specimen Description BLOOD RIGHT HAND    Special Requests      BOTTLES DRAWN AEROBIC AND ANAEROBIC Blood Culture results may not be optimal due to an inadequate volume of blood received in culture bottles   Culture      NO GROWTH 5 DAYS Performed at [facility], [place] [address], [place], [place] [address]    Report Status 07/28/2020 FINAL   Culture, blood (routine x 2)     Status: None   Collection Time: 07/23/20  5:56 PM   Specimen: BLOOD RIGHT HAND  Result Value Ref Range   Specimen Description BLOOD RIGHT HAND    Special Requests AEROBIC BOTTLE ONLY Blood Culture adequate volume    Culture      NO GROWTH 5 DAYS Performed at [facility], [place] [address], [place],  [address]    Report Status 07/28/2020 FINAL   CBC with Differential/Platelet     Status: Abnormal   Collection Time: 07/23/20  6:16 PM  Result Value Ref Range   WBC 10.3 4.0 - 10.5 K/uL   RBC 3.87 3.87 - 5.11 MIL/uL   Hemoglobin 12.7 12.0 - 15.0 g/dL   HCT 37.4 36.0 - 46.0 %   MCV 96.6 80.0 - 100.0 fL   MCH 32.8 26.0 - 34.0 pg   MCHC 34.0 30.0 - 36.0 g/dL   RDW 19.9 (H) 11.5 - 15.5 %   Platelets 326 150 - 400 K/uL   nRBC 0.0 0.0 - 0.2 %   Neutrophils Relative % 75 %   Neutro Abs 7.9 (H) 1.7 - 7.7 K/uL   Lymphocytes Relative 12 %   Lymphs Abs 1.2 0.7 - 4.0 K/uL   Monocytes Relative 7 %   Monocytes Absolute 0.7 0.1 - 1.0 K/uL   Eosinophils Relative 4 %   Eosinophils Absolute 0.4 0.0 - 0.5 K/uL   Basophils Relative 1 %   Basophils Absolute 0.1 0.0 - 0.1 K/uL   Immature Granulocytes 1 %   Abs Immature Granulocytes 0.05 0.00 - 0.07 K/uL    Comment: Performed at [facility], [address], [place], [place] [address]  Body fluid cell count with differential     Status: Abnormal   Collection Time: 07/23/20  6:28 PM  Result Value Ref Range   Fluid Type-FCT PERITONEAL     Comment: FLUID CORRECTED ON 03/23 AT 1955: PREVIOUSLY  REPORTED AS Peritoneal    Color, Fluid STRAW    Appearance, Fluid CLEAR CLEAR   Total Nucleated Cell Count, Fluid 36 0 - 1,000 cu mm   Neutrophil Count, Fluid 43 (H) 0 - 25 %   Lymphs, Fluid 16 %   Monocyte-Macrophage-Serous Fluid 35 (L) 50 - 90 %   Eos, Fluid 6 %    Comment: Performed at [facility], [place] [address], [place], [place] [address]  Body fluid culture w Gram Stain     Status: None   Collection Time: 07/23/20  6:28 PM   Specimen: Body Fluid  Result Value Ref Range   Specimen Description FLUID PERITONEAL    Special Requests NONE    Gram Stain NO WBC SEEN NO ORGANISMS SEEN     Culture      NO GROWTH Performed at [facility], [address], [place], [place] [address]    Report Status 07/27/2020 FINAL   Pathologist smear review     Status: None   Collection Time: 07/23/20  6:28 PM  Result Value Ref Range   Path Review            Comment: REACTIVE MESOTHELIAL CELLS AND MACROPHAGES PRESENT, NO ATYPIA SEEN. Reviewed by Charolett Bumpers. Jeannie Done, M.D. 07/24/2020 Performed at [facility], [place] [address] [place], [place] [address]   Renal function panel     Status: Abnormal   Collection Time: 07/24/20  4:52 AM  Result Value Ref Range   Sodium 128 (L) 135 - 145 mmol/L   Potassium 3.8 3.5 - 5.1 mmol/L   Chloride 96 (L) 98 - 111 mmol/L   CO2 18 (L) 22 - 32 mmol/L   Glucose, Bld 84 70 - 99 mg/dL    Comment: Glucose reference range applies only to samples taken after fasting for at least 8 hours.   BUN 23 8 - 23 mg/dL   Creatinine, Ser 4.25 (H) 0.44 - 1.00 mg/dL   Calcium 7.7 (L) 8.9 - 10.3 mg/dL   Phosphorus 4.0 2.5 - 4.6 mg/dL  Albumin 1.4 (L) 3.5 - 5.0 g/dL   GFR, Estimated 11 (L) >60 mL/min    Comment: (NOTE) Calculated using the CKD-EPI Creatinine Equation (2021)    Anion gap 14 5 - 15    Comment: Performed at [facility] [address] [place], [place] [address]  CBC with Differential/Platelet     Status: Abnormal   Collection Time:  07/24/20  4:52 AM  Result Value Ref Range   WBC 9.7 4.0 - 10.5 K/uL   RBC 3.59 (L) 3.87 - 5.11 MIL/uL   Hemoglobin 12.0 12.0 - 15.0 g/dL   HCT 34.8 (L) 36.0 - 46.0 %   MCV 96.9 80.0 - 100.0 fL   MCH 33.4 26.0 - 34.0 pg   MCHC 34.5 30.0 - 36.0 g/dL   RDW 19.9 (H) 11.5 - 15.5 %   Platelets 282 150 - 400 K/uL   nRBC 0.0 0.0 - 0.2 %   Neutrophils Relative % 78 %   Neutro Abs 7.5 1.7 - 7.7 K/uL   Lymphocytes Relative 11 %   Lymphs Abs 1.1 0.7 - 4.0 K/uL   Monocytes Relative 6 %   Monocytes Absolute 0.6 0.1 - 1.0 K/uL   Eosinophils Relative 4 %   Eosinophils Absolute 0.4 0.0 - 0.5 K/uL   Basophils Relative 1 %   Basophils Absolute 0.1 0.0 - 0.1 K/uL   Immature Granulocytes 0 %   Abs Immature Granulocytes 0.04 0.00 - 0.07 K/uL    Comment: Performed at [facility] [address], [place], [place] [address]  Ethanol     Status: None   Collection Time: 07/25/20 12:26 AM  Result Value Ref Range   Alcohol, Ethyl (B) <10 <10 mg/dL    Comment: (NOTE) Lowest detectable limit for serum alcohol is 10 mg/dL.  For medical purposes only. Performed at [facility], [place], [place] [address]   Troponin I (High Sensitivity)     Status: Abnormal   Collection Time: 07/25/20 12:26 AM  Result Value Ref Range   Troponin I (High Sensitivity) 66 (H) <18 ng/L    Comment: (NOTE) Elevated high sensitivity troponin I (hsTnI) values and significant  changes across serial measurements may suggest ACS but many other  chronic and acute conditions are known to elevate hsTnI results.  Refer to the "Links" section for chest pain algorithms and additional  guidance. Performed at [facility], [address] [place], [place] [address]   CBC with Differential     Status: Abnormal   Collection Time: 07/25/20 12:26 AM  Result Value Ref [place]   WBC 22.1 (H) 4.0 - 10.5 K/uL   RBC 4.58 3.87 - 5.11 MIL/uL   Hemoglobin 14.9 12.0 - 15.0 g/dL   HCT 44.8 36.0 - 46.0 %    MCV 97.8 80.0 - 100.0 fL   MCH 32.5 26.0 - 34.0 pg   MCHC 33.3 30.0 - 36.0 g/dL   RDW 20.3 (H) 11.5 - 15.5 %   Platelets 422 (H) 150 - 400 K/uL   nRBC 0.1 0.0 - 0.2 %   Neutrophils Relative % 88 %   Neutro Abs 19.4 (H) 1.7 - 7.7 K/uL   Lymphocytes Relative 6 %   Lymphs Abs 1.4 0.7 - 4.0 K/uL   Monocytes Relative 5 %   Monocytes Absolute 1.0 0.1 - 1.0 K/uL   Eosinophils Relative 0 %   Eosinophils Absolute 0.0 0.0 - 0.5 K/uL   Basophils Relative 0 %  Basophils Absolute 0.1 0.0 - 0.1 K/uL   Immature Granulocytes 1 %   Abs Immature Granulocytes 0.17 (H) 0.00 - 0.07 K/uL    Comment: Performed at [facility], [address] [place], [place] [address]  Brain natriuretic peptide     Status: Abnormal   Collection Time: 07/25/20 12:33 AM  Result Value Ref Range   B Natriuretic Peptide 116.4 (H) 0.0 - 100.0 pg/mL    Comment: Performed at [facility], [address] [place], [place] [address]  Resp Panel by RT-PCR (Flu A&B, Covid) Nasopharyngeal Swab     Status: Abnormal   Collection Time: 07/25/20 12:33 AM   Specimen: Nasopharyngeal Swab; Nasopharyngeal(NP) swabs in vial transport medium  Result Value Ref Range   SARS Coronavirus 2 by RT PCR POSITIVE (A) NEGATIVE    Comment: RESULT CALLED TO, READ BACK BY AND VERIFIED WITH: Leonides Schanz AT 4481 07/25/20 BY SDR.PMF (NOTE) SARS-CoV-2 target nucleic acids are DETECTED.  The SARS-CoV-2 RNA is generally detectable in upper respiratory specimens during the acute phase of infection. Positive results are indicative of the presence of the identified virus, but do not rule out bacterial infection or co-infection with other pathogens not detected by the test. Clinical correlation with patient history and other diagnostic information is necessary to determine patient infection status. The expected result is Negative.  Fact Sheet for Patients: EntrepreneurPulse.com.au  Fact Sheet for Healthcare  Providers: IncredibleEmployment.be  This test is not yet approved or cleared by the [place] FDA and  has been authorized for detection and/or diagnosis of SARS-CoV-2 by FDA under an Emergency Use Authorization (EUA).  This EUA will remain in effect (meaning this test  can be used) for the duration of  the COVID-19 declaration under Section 564(b)(1) of the Act, 21 U.S.C. section 360bbb-3(b)(1), unless the authorization is terminated or revoked sooner.     Influenza A by PCR NEGATIVE NEGATIVE   Influenza B by PCR NEGATIVE NEGATIVE    Comment: (NOTE) The Xpert Xpress SARS-CoV-2/FLU/RSV plus assay is intended as an aid in the diagnosis of influenza from Nasopharyngeal swab specimens and should not be used as a sole basis for treatment. Nasal washings and aspirates are unacceptable for Xpert Xpress SARS-CoV-2/FLU/RSV testing.  Fact Sheet for Patients: EntrepreneurPulse.com.au  Fact Sheet for Healthcare Providers: IncredibleEmployment.be  This test is not yet approved or cleared by the [place] FDA and has been authorized for detection and/or diagnosis of SARS-CoV-2 by FDA under an Emergency Use Authorization (EUA). This EUA will remain in effect (meaning this test can be used) for the duration of the COVID-19 declaration under Section 564(b)(1) of the Act, 21 U.S.C. section 360bbb-3(b)(1), unless the authorization is terminated or revoked.  Performed at [facility], [place], [place] [address]   Lactic acid, plasma     Status: Abnormal   Collection Time: 07/25/20 12:35 AM  Result Value Ref Range   Lactic Acid, Venous 4.2 (HH) 0.5 - 1.9 mmol/L    Comment: CRITICAL RESULT CALLED TO, READ BACK BY AND VERIFIED WITH MCKENZIE WALLACE@[email or web address]  07/25/20 RH Performed at [facility], [address] [place], [place] [address]   Blood gas, venous     Status: Abnormal   Collection Time:  07/25/20 12:35 AM  Result Value Ref Range   pH, Ven 7.31 7.250 - 7.430   pCO2, Ven 34 (L) 44.0 - 60.0 mmHg   pO2, Ven 34.0 32.0 - 45.0 mmHg   Bicarbonate 17.1 (L) 20.0 -  28.0 mmol/L   Acid-base deficit 8.2 (H) 0.0 - 2.0 mmol/L   O2 Saturation 58.6 %   Patient temperature 37.0    Collection site VENOUS    Sample type VENOUS     Comment: Performed at [facility], [address] [place], [place] [address]  Culture, blood (routine x 2)     Status: None   Collection Time: 07/25/20 12:35 AM   Specimen: BLOOD  Result Value Ref Range   Specimen Description BLOOD BLOOD LEFT FOREARM    Special Requests      BOTTLES DRAWN AEROBIC AND ANAEROBIC Blood Culture results may not be optimal due to an inadequate volume of blood received in culture bottles   Culture      NO GROWTH 5 DAYS Performed at [facility], [address], [place], [place] [address]    Report Status 07/30/2020 FINAL   Culture, blood (routine x 2)     Status: None   Collection Time: 07/25/20 12:35 AM   Specimen: BLOOD  Result Value Ref Range   Specimen Description BLOOD BLOOD LEFT HAND    Special Requests      BOTTLES DRAWN AEROBIC AND ANAEROBIC Blood Culture adequate volume   Culture      NO GROWTH 5 DAYS Performed at [facility], [address] [place], [place] [address]    Report Status 07/30/2020 FINAL   Lactic acid, plasma     Status: Abnormal   Collection Time: 07/25/20  2:05 AM  Result Value Ref Range   Lactic Acid, Venous 3.8 (HH) 0.5 - 1.9 mmol/L    Comment: CRITICAL VALUE NOTED. VALUE IS CONSISTENT WITH PREVIOUSLY REPORTED/CALLED VALUE SKL Performed at [facility], [address] [place], [place] [address]   Type and screen Ordered by PROVIDER DEFAULT     Status: None   Collection Time: 07/25/20  2:05 AM  Result Value Ref Range   ABO/RH(D) O POS    Antibody Screen NEG    Sample Expiration      07/28/2020,2359 Performed at [facility], [address] [place], [place] [address]   Comprehensive metabolic panel     Status: Abnormal   Collection Time: 07/25/20  3:17 AM  Result Value Ref Range   Sodium 129 (L) 135 - 145 mmol/L   Potassium 3.4 (L) 3.5 - 5.1 mmol/L   Chloride 96 (L) 98 - 111 mmol/L   CO2 17 (L) 22 - 32 mmol/L   Glucose, Bld 173 (H) 70 - 99 mg/dL    Comment: Glucose reference range applies only to samples taken after fasting for at least 8 hours.   BUN 27 (H) 8 - 23 mg/dL   Creatinine, Ser 4.24 (H) 0.44 - 1.00 mg/dL   Calcium 7.9 (L) 8.9 - 10.3 mg/dL   Total Protein 4.5 (L) 6.5 - 8.1 g/dL   Albumin 2.0 (L) 3.5 - 5.0 g/dL   AST 42 (H) 15 - 41 U/L   ALT 25 0 - 44 U/L   Alkaline Phosphatase 159 (H) 38 - 126 U/L   Total Bilirubin 0.6 0.3 - 1.2 mg/dL   GFR, Estimated 11 (L) >60 mL/min    Comment: (NOTE) Calculated using the CKD-EPI Creatinine Equation (2021)    Anion gap 16 (H) 5 - 15    Comment: Performed at [facility], [address], [place], [place] [address]  Troponin I (High Sensitivity)     Status: Abnormal   Collection Time: 07/25/20  3:17  AM  Result Value Ref Range   Troponin I (High Sensitivity) 57 (H) <18 ng/L    Comment: (NOTE) Elevated high sensitivity troponin I (hsTnI) values and significant  changes across serial measurements may suggest ACS but many other  chronic and acute conditions are known to elevate hsTnI results.  Refer to the "Links" section for chest pain algorithms and additional  guidance. Performed at [facility], [address] [place], [place] [address]   Prepare platelet pheresis     Status: None   Collection Time: 07/25/20  4:15 AM  Result Value Ref Range   Unit Number [id number]    Blood Component Type PLTP1 PSORALEN TREATED    Unit division 00    Status of Unit ISSUED,FINAL    Transfusion Status      OK TO TRANSFUSE Performed at [facility], [address] [place] [address] [place], [place] [address]    Unit Number [id number]    Blood Component  Type PLTP1 PSORALEN TREATED    Unit division 00    Status of Unit REL FROM [facility]    Transfusion Status OK TO TRANSFUSE   BPAM Platelet Pheresis     Status: None   Collection Time: 07/25/20  4:15 AM  Result Value Ref Range   ISSUE DATE / TIME [id number]    Blood Product Unit Number [id number]    PRODUCT CODE S1779T90    Unit Type and Rh 7300    Blood Product Expiration Date 300923300762    Blood Product Unit Number [id number]    PRODUCT CODE S9373S28    Unit Type and Rh 9500    Blood Product Expiration Date 768115726203   Comprehensive metabolic panel     Status: Abnormal   Collection Time: 08/05/20  7:20 AM  Result Value Ref Range   Sodium 130 (L) 135 - 145 mmol/L   Potassium 4.0 3.5 - 5.1 mmol/L   Chloride 97 (L) 98 - 111 mmol/L   CO2 23 22 - 32 mmol/L   Glucose, Bld 102 (H) 70 - 99 mg/dL    Comment: Glucose reference range applies only to samples taken after fasting for at least 8 hours.   BUN 26 (H) 8 - 23 mg/dL   Creatinine, Ser 2.85 (H) 0.44 - 1.00 mg/dL   Calcium 8.1 (L) 8.9 - 10.3 mg/dL   Total Protein 3.7 (L) 6.5 - 8.1 g/dL   Albumin 1.1 (L) 3.5 - 5.0 g/dL   AST 28 15 - 41 U/L   ALT 23 0 - 44 U/L   Alkaline Phosphatase 122 38 - 126 U/L   Total Bilirubin 0.4 0.3 - 1.2 mg/dL   GFR, Estimated 17 (L) >60 mL/min    Comment: (NOTE) Calculated using the CKD-EPI Creatinine Equation (2021)    Anion gap 10 5 - 15    Comment: Performed at [facility], [place] [address], [place] [address]  CBC with Differential/Platelet     Status: Abnormal   Collection Time: 08/08/20  5:12 AM  Result Value Ref Range   WBC 10.0 4.0 - 10.5 K/uL   RBC 3.12 (L) 3.87 - 5.11 MIL/uL   Hemoglobin 10.0 (L) 12.0 - 15.0 g/dL   HCT 30.4 (L) 36.0 - 46.0 %   MCV 97.4 80.0 - 100.0 fL   MCH 32.1 26.0 - 34.0 pg   MCHC 32.9 30.0 - 36.0 g/dL   RDW 16.4 (H) 11.5 - 15.5 %   Platelets 336 150 - 400 K/uL   nRBC  0.0 0.0 - 0.2 %   Neutrophils Relative % 75 %   Neutro Abs 7.7 1.7 - 7.7  K/uL   Lymphocytes Relative 10 %   Lymphs Abs 1.0 0.7 - 4.0 K/uL   Monocytes Relative 8 %   Monocytes Absolute 0.8 0.1 - 1.0 K/uL   Eosinophils Relative 5 %   Eosinophils Absolute 0.5 0.0 - 0.5 K/uL   Basophils Relative 1 %   Basophils Absolute 0.1 0.0 - 0.1 K/uL   Immature Granulocytes 1 %   Abs Immature Granulocytes 0.06 0.00 - 0.07 K/uL    Comment: Performed at [facility] [address], [place], [place] [address]  CBC with Differential/Platelet     Status: Abnormal   Collection Time: 08/11/20 11:38 AM  Result Value Ref Range   WBC 10.4 4.0 - 10.5 K/uL   RBC 3.38 (L) 3.87 - 5.11 MIL/uL   Hemoglobin 10.7 (L) 12.0 - 15.0 g/dL   HCT 33.2 (L) 36.0 - 46.0 %   MCV 98.2 80.0 - 100.0 fL   MCH 31.7 26.0 - 34.0 pg   MCHC 32.2 30.0 - 36.0 g/dL   RDW 15.8 (H) 11.5 - 15.5 %   Platelets 345 150 - 400 K/uL   nRBC 0.0 0.0 - 0.2 %   Neutrophils Relative % 75 %   Neutro Abs 7.9 (H) 1.7 - 7.7 K/uL   Lymphocytes Relative 10 %   Lymphs Abs 1.0 0.7 - 4.0 K/uL   Monocytes Relative 8 %   Monocytes Absolute 0.8 0.1 - 1.0 K/uL   Eosinophils Relative 5 %   Eosinophils Absolute 0.6 (H) 0.0 - 0.5 K/uL   Basophils Relative 1 %   Basophils Absolute 0.1 0.0 - 0.1 K/uL   Immature Granulocytes 1 %   Abs Immature Granulocytes 0.05 0.00 - 0.07 K/uL    Comment: Performed at [facility], [place] [address], [place], [place] [address]  Body fluid culture w Gram Stain     Status: None   Collection Time: 08/13/20  5:00 AM   Specimen: Peritoneal Washings; Body Fluid  Result Value Ref Range   Specimen Description PERITONEAL FLUID    Special Requests NONE    Gram Stain      WBC PRESENT, PREDOMINANTLY MONONUCLEAR NO ORGANISMS SEEN CYTOSPIN SMEAR    Culture      NO GROWTH Performed at [facility], [place] [address], [place],  [address]    Report Status 08/16/2020 FINAL   Renal function panel     Status: Abnormal   Collection Time: 08/13/20  7:39 AM  Result Value Ref Range    Sodium 129 (L) 135 - 145 mmol/L   Potassium 2.6 (LL) 3.5 - 5.1 mmol/L    Comment: CRITICAL RESULT CALLED TO, READ BACK BY AND VERIFIED WITH: H.LILJA,RN 1019 08/13/20 CLARK,S    Chloride 92 (L) 98 - 111 mmol/L   CO2 27 22 - 32 mmol/L   Glucose, Bld 117 (H) 70 - 99 mg/dL    Comment: Glucose reference range applies only to samples taken after fasting for at least 8 hours.   BUN 24 (H) 8 - 23 mg/dL   Creatinine, Ser 2.68 (H) 0.44 - 1.00 mg/dL   Calcium 8.4 (L) 8.9 - 10.3 mg/dL   Phosphorus 3.6 2.5 - 4.6 mg/dL   Albumin 1.2 (L) 3.5 - 5.0 g/dL   GFR, Estimated 18 (L) >60 mL/min    Comment: (NOTE) Calculated using the CKD-EPI Creatinine Equation (2021)  Anion gap 10 5 - 15    Comment: Performed at [facility] [address], [place], [place] [address]  CBC     Status: Abnormal   Collection Time: 08/13/20  7:39 AM  Result Value Ref Range   WBC 8.2 4.0 - 10.5 K/uL   RBC 3.38 (L) 3.87 - 5.11 MIL/uL   Hemoglobin 10.8 (L) 12.0 - 15.0 g/dL   HCT 33.4 (L) 36.0 - 46.0 %   MCV 98.8 80.0 - 100.0 fL   MCH 32.0 26.0 - 34.0 pg   MCHC 32.3 30.0 - 36.0 g/dL   RDW 15.6 (H) 11.5 - 15.5 %   Platelets 348 150 - 400 K/uL   nRBC 0.0 0.0 - 0.2 %    Comment: Performed at [facility], [place] [address] [place], [place] [address]  PTH, intact and calcium     Status: Abnormal   Collection Time: 08/13/20  7:39 AM  Result Value Ref Range   PTH 47 15 - 65 pg/mL   Calcium, Total (PTH) 8.2 (L) 8.7 - 10.3 mg/dL   PTH Interp Comment     Comment: (NOTE) Interpretation                 Intact PTH    Calcium                                (pg/mL)      (mg/dL) Normal                          15 - 65     8.6 - 10.2 Primary Hyperparathyroidism         >65          >10.2 Secondary Hyperparathyroidism       >65          <10.2 Non-Parathyroid Hypercalcemia       <65          >10.2 Hypoparathyroidism                  <15          < 8.6 Non-Parathyroid Hypocalcemia    15 - 65          < 8.6 Performed At:  [facility] [place], [place] [id number] Rush Farmer MD GM:[id number]   Body fluid cell count with differential     Status: Abnormal   Collection Time: 08/13/20  8:45 AM  Result Value Ref Range   Fluid Type-FCT BODY FLUID PERITONEAL     Comment: CORRECTED ON 04/13 AT 4403: PREVIOUSLY REPORTED AS PERITONEAL DIALYSATE   Color, Fluid COLORLESS (A) YELLOW   Appearance, Fluid CLEAR (A) CLEAR   Total Nucleated Cell Count, Fluid 6 0 - 1,000 cu mm   Other Cells, Fluid TOO FEW TO COUNT, SMEAR AVAILABLE FOR REVIEW %    Comment: RARE NEUTROPHIL FEW LYMPHOCYTES RARE MONOCYTES Performed at [facility], [place] [address], [place], [place] [address]   Potassium     Status: None   Collection Time: 08/13/20  3:47 PM  Result Value Ref Range   Potassium 3.7 3.5 - 5.1 mmol/L    Comment: NO VISIBLE HEMOLYSIS Performed at [facility] [address], [place], [place] [address]   Magnesium     Status: None   Collection Time: 08/13/20  3:47 PM  Result Value Ref Range   Magnesium 1.7 1.7 - 2.4 mg/dL    Comment: Performed at [facility], [name] [address], [place], [place] [address]  Renal function panel     Status: Abnormal   Collection Time: 08/14/20  5:28 AM  Result Value Ref Range   Sodium 127 (L) 135 - 145 mmol/L   Potassium 3.6 3.5 - 5.1 mmol/L   Chloride 92 (L) 98 - 111 mmol/L   CO2 28 22 - 32 mmol/L   Glucose, Bld 119 (H) 70 - 99 mg/dL    Comment: Glucose reference range applies only to samples taken after fasting for at least 8 hours.   BUN 23 8 - 23 mg/dL   Creatinine, Ser 2.83 (H) 0.44 - 1.00 mg/dL   Calcium 8.2 (L) 8.9 - 10.3 mg/dL   Phosphorus 3.3 2.5 - 4.6 mg/dL   Albumin 1.1 (L) 3.5 - 5.0 g/dL   GFR, Estimated 17 (L) >60 mL/min    Comment: (NOTE) Calculated using the CKD-EPI Creatinine Equation (2021)    Anion gap 7 5 - 15    Comment: Performed at [facility] [address] [place], [place] [address]  Osmolality, urine     Status:  None   Collection Time: 08/15/20  5:00 AM  Result Value Ref Range   Osmolality, Ur 306 300 - 900 mOsm/kg    Comment: REPEATED TO VERIFY Performed at [facility], [place] [address], [place], [place] [address]   Na and K (sodium & potassium), rand urine     Status: None   Collection Time: 08/15/20  5:00 AM  Result Value Ref Range   Sodium, Ur 53 mmol/L   Potassium Urine 25 mmol/L    Comment: Performed at [facility] [address], [place], [place] [address]  Osmolality     Status: Abnormal   Collection Time: 08/15/20  5:04 AM  Result Value Ref Range   Osmolality 271 (L) 275 - 295 mOsm/kg    Comment: Performed at [facility], [place] [address], [place], [place] [address]  Renal function panel     Status: Abnormal   Collection Time: 08/15/20  5:04 AM  Result Value Ref Range   Sodium 127 (L) 135 - 145 mmol/L   Potassium 3.0 (L) 3.5 - 5.1 mmol/L   Chloride 92 (L) 98 - 111 mmol/L   CO2 26 22 - 32 mmol/L   Glucose, Bld 91 70 - 99 mg/dL    Comment: Glucose reference range applies only to samples taken after fasting for at least 8 hours.   BUN 24 (H) 8 - 23 mg/dL   Creatinine, Ser 2.80 (H) 0.44 - 1.00 mg/dL   Calcium 8.1 (L) 8.9 - 10.3 mg/dL   Phosphorus 3.6 2.5 - 4.6 mg/dL   Albumin 1.0 (L) 3.5 - 5.0 g/dL   GFR, Estimated 18 (L) >60 mL/min    Comment: (NOTE) Calculated using the CKD-EPI Creatinine Equation (2021)    Anion gap 9 5 - 15    Comment: Performed at [facility] [address], [place], [place] [address]  Urinalysis, Routine w reflex microscopic Urine, Clean Catch     Status: Abnormal   Collection Time: 08/15/20 12:07 PM  Result Value Ref Range   Color, Urine YELLOW YELLOW   APPearance HAZY (A) CLEAR   Specific Gravity, Urine 1.019 1.005 - 1.030   pH 5.0 5.0 - 8.0   Glucose, UA NEGATIVE NEGATIVE  mg/dL   Hgb urine dipstick NEGATIVE NEGATIVE   Bilirubin Urine SMALL (A) NEGATIVE   Ketones, ur NEGATIVE NEGATIVE mg/dL   Protein, ur NEGATIVE  NEGATIVE mg/dL   Nitrite NEGATIVE NEGATIVE   Leukocytes,Ua SMALL (A) NEGATIVE   RBC / HPF 0-5 0 - 5 RBC/hpf   WBC, UA 6-10 0 - 5 WBC/hpf   Bacteria, UA RARE (A) NONE SEEN   Squamous Epithelial / LPF 0-5 0 - 5    Comment: Performed at [facility], [place] [address], [place], [place] [address]  Culture, Urine     Status: Abnormal   Collection Time: 08/15/20 12:09 PM   Specimen: Urine, Clean Catch  Result Value Ref Range   Specimen Description URINE, CLEAN CATCH    Special Requests      NONE Performed at [facility], [place] [address] [place], [place] [address]    Culture MULTIPLE SPECIES PRESENT, SUGGEST RECOLLECTION (A)    Report Status 08/16/2020 FINAL   Basic metabolic panel     Status: Abnormal   Collection Time: 08/18/20  6:12 AM  Result Value Ref Range   Sodium 124 (L) 135 - 145 mmol/L   Potassium 3.6 3.5 - 5.1 mmol/L   Chloride 91 (L) 98 - 111 mmol/L   CO2 26 22 - 32 mmol/L   Glucose, Bld 86 70 - 99 mg/dL    Comment: Glucose reference range applies only to samples taken after fasting for at least 8 hours.   BUN 23 8 - 23 mg/dL   Creatinine, Ser 2.84 (H) 0.44 - 1.00 mg/dL   Calcium 8.3 (L) 8.9 - 10.3 mg/dL   GFR, Estimated 17 (L) >60 mL/min    Comment: (NOTE) Calculated using the CKD-EPI Creatinine Equation (2021)    Anion gap 7 5 - 15    Comment: Performed at [facility] [address] [place], [place] [address]  CBC     Status: Abnormal   Collection Time: 08/18/20  6:12 AM  Result Value Ref Range   WBC 7.7 4.0 - 10.5 K/uL   RBC 3.06 (L) 3.87 - 5.11 MIL/uL   Hemoglobin 10.0 (L) 12.0 - 15.0 g/dL   HCT 30.2 (L) 36.0 - 46.0 %   MCV 98.7 80.0 - 100.0 fL   MCH 32.7 26.0 - 34.0 pg   MCHC 33.1 30.0 - 36.0 g/dL   RDW 15.6 (H) 11.5 - 15.5 %   Platelets 353 150 - 400 K/uL   nRBC 0.0 0.0 - 0.2 %    Comment: Performed at [facility], [place] [address], [place], [place] [address]  Basic metabolic panel     Status: Abnormal   Collection Time: 08/19/20  10:27 AM  Result Value Ref Range   Sodium 125 (L) 135 - 145 mmol/L   Potassium 3.9 3.5 - 5.1 mmol/L   Chloride 92 (L) 98 - 111 mmol/L   CO2 27 22 - 32 mmol/L   Glucose, Bld 85 70 - 99 mg/dL    Comment: Glucose reference range applies only to samples taken after fasting for at least 8 hours.   BUN 24 (H) 8 - 23 mg/dL   Creatinine, Ser 2.93 (H) 0.44 - 1.00 mg/dL   Calcium 8.1 (L) 8.9 - 10.3 mg/dL   GFR, Estimated 17 (L) >60 mL/min    Comment: (NOTE) Calculated using the CKD-EPI Creatinine Equation (2021)    Anion gap 6 5 - 15    Comment: Performed at [facility], [place] [address], [place], [place] [address]  Basic metabolic panel     Status: Abnormal   Collection Time: 08/20/20  5:38 AM  Result Value Ref Range   Sodium 129 (L) 135 - 145 mmol/L   Potassium 4.0 3.5 - 5.1 mmol/L   Chloride 93 (L) 98 - 111 mmol/L   CO2 27 22 - 32 mmol/L   Glucose, Bld 88 70 - 99 mg/dL    Comment: Glucose reference range applies only to samples taken after fasting for at least 8 hours.   BUN 23 8 - 23 mg/dL   Creatinine, Ser 3.05 (H) 0.44 - 1.00 mg/dL   Calcium 8.6 (L) 8.9 - 10.3 mg/dL   GFR, Estimated 16 (L) >60 mL/min    Comment: (NOTE) Calculated using the CKD-EPI Creatinine Equation (2021)    Anion gap 9 5 - 15    Comment: Performed at [facility] [address], [place], [place] [address]  Basic metabolic panel     Status: Abnormal   Collection Time: 08/21/20  5:10 AM  Result Value Ref Range   Sodium 128 (L) 135 - 145 mmol/L   Potassium 3.2 (L) 3.5 - 5.1 mmol/L   Chloride 94 (L) 98 - 111 mmol/L   CO2 22 22 - 32 mmol/L   Glucose, Bld 83 70 - 99 mg/dL    Comment: Glucose reference range applies only to samples taken after fasting for at least 8 hours.   BUN 25 (H) 8 - 23 mg/dL   Creatinine, Ser 2.90 (H) 0.44 - 1.00 mg/dL   Calcium 8.2 (L) 8.9 - 10.3 mg/dL   GFR, Estimated 17 (L) >60 mL/min    Comment: (NOTE) Calculated using the CKD-EPI Creatinine Equation (2021)    Anion  gap 12 5 - 15    Comment: Performed at [facility] [address], [place], [place] [address]  Basic metabolic panel     Status: Abnormal   Collection Time: 08/22/20  6:54 AM  Result Value Ref Range   Sodium 127 (L) 135 - 145 mmol/L   Potassium 3.4 (L) 3.5 - 5.1 mmol/L   Chloride 93 (L) 98 - 111 mmol/L   CO2 25 22 - 32 mmol/L   Glucose, Bld 89 70 - 99 mg/dL    Comment: Glucose reference range applies only to samples taken after fasting for at least 8 hours.   BUN 23 8 - 23 mg/dL   Creatinine, Ser 2.87 (H) 0.44 - 1.00 mg/dL   Calcium 8.1 (L) 8.9 - 10.3 mg/dL   GFR, Estimated 17 (L) >60 mL/min    Comment: (NOTE) Calculated using the CKD-EPI Creatinine Equation (2021)    Anion gap 9 5 - 15    Comment: Performed at [facility] [address] [place],  [address]  Basic metabolic panel     Status: Abnormal   Collection Time: 08/23/20  4:37 AM  Result Value Ref Range   Sodium 127 (L) 135 - 145 mmol/L   Potassium 3.6 3.5 - 5.1 mmol/L   Chloride 93 (L) 98 - 111 mmol/L   CO2 23 22 - 32 mmol/L   Glucose, Bld 83 70 - 99 mg/dL    Comment: Glucose reference range applies only to samples taken after fasting for at least 8 hours.   BUN 22 8 - 23 mg/dL   Creatinine, Ser 2.79 (H) 0.44 - 1.00 mg/dL   Calcium 8.1 (L) 8.9 - 10.3 mg/dL  GFR, Estimated 18 (L) >60 mL/min    Comment: (NOTE) Calculated using the CKD-EPI Creatinine Equation (2021)    Anion gap 11 5 - 15    Comment: Performed at [facility], [place] [address], [place], [place] [address]  Basic metabolic panel     Status: Abnormal   Collection Time: 08/24/20  4:23 AM  Result Value Ref Range   Sodium 126 (L) 135 - 145 mmol/L   Potassium 4.9 3.5 - 5.1 mmol/L    Comment: SLIGHT HEMOLYSIS   Chloride 95 (L) 98 - 111 mmol/L   CO2 20 (L) 22 - 32 mmol/L   Glucose, Bld 80 70 - 99 mg/dL    Comment: Glucose reference range applies only to samples taken after fasting for at least 8 hours.   BUN 22 8 - 23 mg/dL    Creatinine, Ser 2.66 (H) 0.44 - 1.00 mg/dL   Calcium 8.1 (L) 8.9 - 10.3 mg/dL   GFR, Estimated 19 (L) >60 mL/min    Comment: (NOTE) Calculated using the CKD-EPI Creatinine Equation (2021)    Anion gap 11 5 - 15    Comment: Performed at [facility] [address] [place], [place] [address]  CBC     Status: Abnormal   Collection Time: 08/25/20  4:48 AM  Result Value Ref Range   WBC 5.7 4.0 - 10.5 K/uL   RBC 3.14 (L) 3.87 - 5.11 MIL/uL   Hemoglobin 9.9 (L) 12.0 - 15.0 g/dL   HCT 30.8 (L) 36.0 - 46.0 %   MCV 98.1 80.0 - 100.0 fL   MCH 31.5 26.0 - 34.0 pg   MCHC 32.1 30.0 - 36.0 g/dL   RDW 17.2 (H) 11.5 - 15.5 %   Platelets 265 150 - 400 K/uL   nRBC 0.0 0.0 - 0.2 %    Comment: Performed at [facility], [place] [address] [place], [place] [address]  Basic metabolic panel     Status: Abnormal   Collection Time: 08/25/20  4:48 AM  Result Value Ref Range   Sodium 127 (L) 135 - 145 mmol/L   Potassium 3.3 (L) 3.5 - 5.1 mmol/L   Chloride 94 (L) 98 - 111 mmol/L   CO2 25 22 - 32 mmol/L   Glucose, Bld 94 70 - 99 mg/dL    Comment: Glucose reference range applies only to samples taken after fasting for at least 8 hours.   BUN 19 8 - 23 mg/dL   Creatinine, Ser 2.87 (H) 0.44 - 1.00 mg/dL   Calcium 8.3 (L) 8.9 - 10.3 mg/dL   GFR, Estimated 17 (L) >60 mL/min    Comment: (NOTE) Calculated using the CKD-EPI Creatinine Equation (2021)    Anion gap 8 5 - 15    Comment: Performed at [facility] [address], [place], [place] [address]  Renal function panel     Status: Abnormal   Collection Time: 08/25/20 12:10 PM  Result Value Ref Range   Sodium 127 (L) 135 - 145 mmol/L   Potassium 3.7 3.5 - 5.1 mmol/L   Chloride 90 (L) 98 - 111 mmol/L   CO2 24 22 - 32 mmol/L   Glucose, Bld 108 (H) 70 - 99 mg/dL    Comment: Glucose reference range applies only to samples taken after fasting for at least 8 hours.   BUN 22 8 - 23 mg/dL   Creatinine, Ser 3.10 (H) 0.44 - 1.00 mg/dL    Calcium 8.5 (L)  8.9 - 10.3 mg/dL   Phosphorus 3.9 2.5 - 4.6 mg/dL   Albumin 1.3 (L) 3.5 - 5.0 g/dL   GFR, Estimated 15 (L) >60 mL/min    Comment: (NOTE) Calculated using the CKD-EPI Creatinine Equation (2021)    Anion gap 13 5 - 15    Comment: Performed at [facility] [address], [place], [place] [address]  Renal function panel     Status: Abnormal   Collection Time: 08/27/20  5:14 AM  Result Value Ref Range   Sodium 126 (L) 135 - 145 mmol/L   Potassium 3.1 (L) 3.5 - 5.1 mmol/L   Chloride 93 (L) 98 - 111 mmol/L   CO2 25 22 - 32 mmol/L   Glucose, Bld 98 70 - 99 mg/dL    Comment: Glucose reference range applies only to samples taken after fasting for at least 8 hours.   BUN 19 8 - 23 mg/dL   Creatinine, Ser 2.93 (H) 0.44 - 1.00 mg/dL   Calcium 8.1 (L) 8.9 - 10.3 mg/dL   Phosphorus 4.1 2.5 - 4.6 mg/dL   Albumin <1.0 (L) 3.5 - 5.0 g/dL   GFR, Estimated 17 (L) >60 mL/min    Comment: (NOTE) Calculated using the CKD-EPI Creatinine Equation (2021)    Anion gap 8 5 - 15    Comment: Performed at [facility] [address], [place], [place] [address]  CBC     Status: Abnormal   Collection Time: 08/27/20  5:14 AM  Result Value Ref Range   WBC 6.5 4.0 - 10.5 K/uL   RBC 3.17 (L) 3.87 - 5.11 MIL/uL   Hemoglobin 10.4 (L) 12.0 - 15.0 g/dL   HCT 31.1 (L) 36.0 - 46.0 %   MCV 98.1 80.0 - 100.0 fL   MCH 32.8 26.0 - 34.0 pg   MCHC 33.4 30.0 - 36.0 g/dL   RDW 17.5 (H) 11.5 - 15.5 %   Platelets 243 150 - 400 K/uL   nRBC 0.0 0.0 - 0.2 %    Comment: Performed at [facility], [place] [address], [place], [place] [address]  Body fluid cell count with differential     Status: Abnormal   Collection Time: 08/28/20 10:06 AM  Result Value Ref Range   Fluid Type-FCT Peritoneal    Color, Fluid COLORLESS (A) YELLOW   Appearance, Fluid CLEAR CLEAR   Total Nucleated Cell Count, Fluid 3 0 - 1,000 cu mm   Other Cells, Fluid TOO FEW TO COUNT, SMEAR AVAILABLE FOR REVIEW %    Comment:  FEW NEUTROPHILS, LYMPHOCYTES, AND MONOCYTES. Performed at [facility], [place] [address], [place], [place] [address]   Pathologist smear review     Status: None   Collection Time: 08/28/20 10:06 AM  Result Value Ref Range   Path Review NO MALIGNANT CELLS     Comment: Reviewed by Arrie Aran L. Melina Copa, M.D. 08/29/2020 Performed at [facility], [place] [address], [place], [place] [address]   Body fluid culture w Gram Stain     Status: None   Collection Time: 08/28/20 10:07 AM   Specimen: Body Fluid  Result Value Ref Range   Specimen Description FLUID    Special Requests PERITONEAL DIALYSIS    Gram Stain NO WBC SEEN NO ORGANISMS SEEN CYTOSPIN SMEAR     Culture      NO GROWTH 3 DAYS Performed at [facility], [address], [place], [place] [address]  Report Status 08/31/2020 FINAL   Renal function panel     Status: Abnormal   Collection Time: 08/29/20  5:34 AM  Result Value Ref Range   Sodium 127 (L) 135 - 145 mmol/L   Potassium 3.5 3.5 - 5.1 mmol/L   Chloride 92 (L) 98 - 111 mmol/L   CO2 22 22 - 32 mmol/L   Glucose, Bld 86 70 - 99 mg/dL    Comment: Glucose reference range applies only to samples taken after fasting for at least 8 hours.   BUN 17 8 - 23 mg/dL   Creatinine, Ser 2.80 (H) 0.44 - 1.00 mg/dL   Calcium 8.3 (L) 8.9 - 10.3 mg/dL   Phosphorus 4.5 2.5 - 4.6 mg/dL   Albumin 1.2 (L) 3.5 - 5.0 g/dL   GFR, Estimated 18 (L) >60 mL/min    Comment: (NOTE) Calculated using the CKD-EPI Creatinine Equation (2021)    Anion gap 13 5 - 15    Comment: Performed at [facility] [address], [place], [place] [address]  Renal function panel     Status: Abnormal   Collection Time: 08/30/20  5:38 AM  Result Value Ref Range   Sodium 126 (L) 135 - 145 mmol/L   Potassium 3.3 (L) 3.5 - 5.1 mmol/L   Chloride 91 (L) 98 - 111 mmol/L   CO2 24 22 - 32 mmol/L   Glucose, Bld 88 70 - 99 mg/dL    Comment: Glucose reference range applies only to samples taken after  fasting for at least 8 hours.   BUN 18 8 - 23 mg/dL   Creatinine, Ser 2.90 (H) 0.44 - 1.00 mg/dL   Calcium 8.3 (L) 8.9 - 10.3 mg/dL   Phosphorus 4.5 2.5 - 4.6 mg/dL   Albumin 1.2 (L) 3.5 - 5.0 g/dL   GFR, Estimated 17 (L) >60 mL/min    Comment: (NOTE) Calculated using the CKD-EPI Creatinine Equation (2021)    Anion gap 11 5 - 15    Comment: Performed at [facility] [address], [place], [place] [address]  CBC     Status: Abnormal   Collection Time: 08/30/20  5:38 AM  Result Value Ref Range   WBC 10.3 4.0 - 10.5 K/uL   RBC 3.35 (L) 3.87 - 5.11 MIL/uL   Hemoglobin 10.8 (L) 12.0 - 15.0 g/dL   HCT 32.4 (L) 36.0 - 46.0 %   MCV 96.7 80.0 - 100.0 fL   MCH 32.2 26.0 - 34.0 pg   MCHC 33.3 30.0 - 36.0 g/dL   RDW 18.5 (H) 11.5 - 15.5 %   Platelets 230 150 - 400 K/uL   nRBC 0.0 0.0 - 0.2 %    Comment: Performed at [facility], [place] [address] [place], [place] [address]  CBC     Status: Abnormal   Collection Time: 09/01/20  5:48 AM  Result Value Ref Range   WBC 9.4 4.0 - 10.5 K/uL   RBC 3.43 (L) 3.87 - 5.11 MIL/uL   Hemoglobin 11.3 (L) 12.0 - 15.0 g/dL   HCT 33.8 (L) 36.0 - 46.0 %   MCV 98.5 80.0 - 100.0 fL   MCH 32.9 26.0 - 34.0 pg   MCHC 33.4 30.0 - 36.0 g/dL   RDW 18.3 (H) 11.5 - 15.5 %   Platelets 242 150 - 400 K/uL   nRBC 0.0 0.0 - 0.2 %    Comment: Performed at [facility], [place] [address] [place], [place]  [address]  Basic metabolic panel     Status: Abnormal   Collection Time: 09/01/20  5:48 AM  Result Value Ref Range   Sodium 126 (L) 135 - 145 mmol/L   Potassium 3.4 (L) 3.5 - 5.1 mmol/L   Chloride 91 (L) 98 - 111 mmol/L   CO2 24 22 - 32 mmol/L   Glucose, Bld 84 70 - 99 mg/dL    Comment: Glucose reference range applies only to samples taken after fasting for at least 8 hours.   BUN 18 8 - 23 mg/dL   Creatinine, Ser 3.12 (H) 0.44 - 1.00 mg/dL   Calcium 8.2 (L) 8.9 - 10.3 mg/dL   GFR, Estimated 15 (L) >60 mL/min    Comment: (NOTE) Calculated  using the CKD-EPI Creatinine Equation (2021)    Anion gap 11 5 - 15    Comment: Performed at [facility] [address] [place], [place] [address]  Protime-INR     Status: None   Collection Time: 09/02/20  5:28 AM  Result Value Ref Range   Prothrombin Time 13.8 11.4 - 15.2 seconds   INR 1.1 0.8 - 1.2    Comment: (NOTE) INR goal varies based on device and disease states. Performed at [facility], [place] [address], [place], [place] [address]   Hepatitis B core antibody, total     Status: None   Collection Time: 09/03/20  4:48 AM  Result Value Ref Range   Hep B Core Total Ab NON REACTIVE NON REACTIVE    Comment: Performed at [facility] [address] [place], [place] [address]  Hepatitis B surface antigen     Status: None   Collection Time: 09/03/20  4:48 AM  Result Value Ref Range   Hepatitis B Surface Ag NON REACTIVE NON REACTIVE    Comment: Performed at [facility] [address], [place], [place] [address]  Hepatitis B surface antibody     Status: None   Collection Time: 09/03/20  4:48 AM  Result Value Ref Range   Hepatitis B-Post 12.2 Immunity>9.9 mIU/mL    Comment: (NOTE)  Status of Immunity                     Anti-HBs Level  ------------------                     -------------- Inconsistent with Immunity                   0.0 - 9.9 Consistent with Immunity                          >9.9 Performed At: [facility] [place], [place] [id number] Rush Farmer MD [id number]   Renal function panel     Status: Abnormal   Collection Time: 09/04/20  6:01 AM  Result Value Ref Range   Sodium 124 (L) 135 - 145 mmol/L   Potassium 4.7 3.5 - 5.1 mmol/L   Chloride 92 (L) 98 - 111 mmol/L   CO2 24 22 - 32 mmol/L   Glucose, Bld 86 70 - 99 mg/dL    Comment: Glucose reference range applies only to samples taken after fasting for at least 8 hours.   BUN 27 (H) 8 - 23 mg/dL   Creatinine, Ser 3.82 (H) 0.44 - 1.00 mg/dL   Calcium  8.1 (L) 8.9 - 10.3 mg/dL  Phosphorus 5.4 (H) 2.5 - 4.6 mg/dL   Albumin 1.1 (L) 3.5 - 5.0 g/dL   GFR, Estimated 12 (L) >60 mL/min    Comment: (NOTE) Calculated using the CKD-EPI Creatinine Equation (2021)    Anion gap 8 5 - 15    Comment: Performed at [facility] [address] [place], [place] [address]  CBC     Status: Abnormal   Collection Time: 09/04/20  6:12 PM  Result Value Ref Range   WBC 12.2 (H) 4.0 - 10.5 K/uL   RBC 3.58 (L) 3.87 - 5.11 MIL/uL   Hemoglobin 11.8 (L) 12.0 - 15.0 g/dL   HCT 34.4 (L) 36.0 - 46.0 %   MCV 96.1 80.0 - 100.0 fL   MCH 33.0 26.0 - 34.0 pg   MCHC 34.3 30.0 - 36.0 g/dL   RDW 17.0 (H) 11.5 - 15.5 %   Platelets 216 150 - 400 K/uL   nRBC 0.0 0.0 - 0.2 %    Comment: Performed at [facility] [address], [place], [place] [address]  CBC     Status: Abnormal   Collection Time: 09/06/20 12:22 PM  Result Value Ref Range   WBC 8.3 4.0 - 10.5 K/uL   RBC 3.35 (L) 3.87 - 5.11 MIL/uL   Hemoglobin 11.1 (L) 12.0 - 15.0 g/dL   HCT 34.1 (L) 36.0 - 46.0 %   MCV 101.8 (H) 80.0 - 100.0 fL   MCH 33.1 26.0 - 34.0 pg   MCHC 32.6 30.0 - 36.0 g/dL   RDW 16.7 (H) 11.5 - 15.5 %   Platelets 239 150 - 400 K/uL   nRBC 0.0 0.0 - 0.2 %    Comment: Performed at [facility], [place] [address], [place], [place] [address]  Renal function panel     Status: Abnormal   Collection Time: 09/06/20 12:23 PM  Result Value Ref Range   Sodium 123 (L) 135 - 145 mmol/L   Potassium 4.0 3.5 - 5.1 mmol/L   Chloride 92 (L) 98 - 111 mmol/L   CO2 24 22 - 32 mmol/L   Glucose, Bld 93 70 - 99 mg/dL    Comment: Glucose reference range applies only to samples taken after fasting for at least 8 hours.   BUN 14 8 - 23 mg/dL   Creatinine, Ser 2.60 (H) 0.44 - 1.00 mg/dL   Calcium 7.9 (L) 8.9 - 10.3 mg/dL   Phosphorus 3.8 2.5 - 4.6 mg/dL   Albumin 1.2 (L) 3.5 - 5.0 g/dL   GFR, Estimated 19 (L) >60 mL/min    Comment: (NOTE) Calculated using the CKD-EPI Creatinine Equation  (2021)    Anion gap 7 5 - 15    Comment: Performed at [facility] [address] [place], [place] [address]  CBC     Status: Abnormal   Collection Time: 09/08/20  6:03 AM  Result Value Ref Range   WBC 4.3 4.0 - 10.5 K/uL   RBC 3.05 (L) 3.87 - 5.11 MIL/uL   Hemoglobin 9.9 (L) 12.0 - 15.0 g/dL   HCT 30.4 (L) 36.0 - 46.0 %   MCV 99.7 80.0 - 100.0 fL   MCH 32.5 26.0 - 34.0 pg   MCHC 32.6 30.0 - 36.0 g/dL   RDW 16.1 (H) 11.5 - 15.5 %   Platelets 190 150 - 400 K/uL   nRBC 0.0 0.0 - 0.2 %    Comment: Performed at [facility], [place]  [address], [place], [place] [address]  Renal function panel     Status: Abnormal   Collection Time: 09/08/20 10:33 AM  Result Value Ref Range   Sodium 125 (L) 135 - 145 mmol/L   Potassium 3.9 3.5 - 5.1 mmol/L   Chloride 91 (L) 98 - 111 mmol/L   CO2 25 22 - 32 mmol/L   Glucose, Bld 85 70 - 99 mg/dL    Comment: Glucose reference range applies only to samples taken after fasting for at least 8 hours.   BUN 12 8 - 23 mg/dL   Creatinine, Ser 2.24 (H) 0.44 - 1.00 mg/dL   Calcium 8.2 (L) 8.9 - 10.3 mg/dL   Phosphorus 2.9 2.5 - 4.6 mg/dL   Albumin 1.7 (L) 3.5 - 5.0 g/dL   GFR, Estimated 23 (L) >60 mL/min    Comment: (NOTE) Calculated using the CKD-EPI Creatinine Equation (2021)    Anion gap 9 5 - 15    Comment: Performed at [facility] [address], [place], [place] [address]  CBC     Status: Abnormal   Collection Time: 09/10/20  4:53 AM  Result Value Ref Range   WBC 4.2 4.0 - 10.5 K/uL   RBC 2.96 (L) 3.87 - 5.11 MIL/uL   Hemoglobin 9.5 (L) 12.0 - 15.0 g/dL   HCT 29.6 (L) 36.0 - 46.0 %   MCV 100.0 80.0 - 100.0 fL   MCH 32.1 26.0 - 34.0 pg   MCHC 32.1 30.0 - 36.0 g/dL   RDW 15.9 (H) 11.5 - 15.5 %   Platelets 171 150 - 400 K/uL   nRBC 0.0 0.0 - 0.2 %    Comment: Performed at [facility], [place] [address] [place], [place] [address]  Renal function panel     Status: Abnormal   Collection Time: 09/10/20  4:53 AM  Result  Value Ref Range   Sodium 126 (L) 135 - 145 mmol/L   Potassium 3.9 3.5 - 5.1 mmol/L   Chloride 97 (L) 98 - 111 mmol/L   CO2 25 22 - 32 mmol/L   Glucose, Bld 81 70 - 99 mg/dL    Comment: Glucose reference range applies only to samples taken after fasting for at least 8 hours.   BUN 9 8 - 23 mg/dL   Creatinine, Ser 1.89 (H) 0.44 - 1.00 mg/dL   Calcium 8.3 (L) 8.9 - 10.3 mg/dL   Phosphorus 2.7 2.5 - 4.6 mg/dL   Albumin 1.3 (L) 3.5 - 5.0 g/dL   GFR, Estimated 28 (L) >60 mL/min    Comment: (NOTE) Calculated using the CKD-EPI Creatinine Equation (2021)    Anion gap 4 (L) 5 - 15    Comment: Performed at [facility] [address] [place], [place] [address]  Renal function panel     Status: Abnormal   Collection Time: 09/11/20 11:37 AM  Result Value Ref Range   Sodium 125 (L) 135 - 145 mmol/L   Potassium 3.7 3.5 - 5.1 mmol/L   Chloride 92 (L) 98 - 111 mmol/L   CO2 25 22 - 32 mmol/L   Glucose, Bld 87 70 - 99 mg/dL    Comment: Glucose reference range applies only to samples taken after fasting for at least 8 hours.   BUN 15 8 - 23 mg/dL   Creatinine, Ser 2.54 (H) 0.44 - 1.00 mg/dL   Calcium 8.4 (L) 8.9 - 10.3 mg/dL   Phosphorus 3.1 2.5 - 4.6 mg/dL   Albumin 1.7 (  L) 3.5 - 5.0 g/dL   GFR, Estimated 20 (L) >60 mL/min    Comment: (NOTE) Calculated using the CKD-EPI Creatinine Equation (2021)    Anion gap 8 5 - 15    Comment: Performed at [facility], [place] [address] [place], [place] [address]  CBC     Status: Abnormal   Collection Time: 09/11/20  2:06 PM  Result Value Ref Range   WBC 7.1 4.0 - 10.5 K/uL   RBC 3.31 (L) 3.87 - 5.11 MIL/uL   Hemoglobin 10.6 (L) 12.0 - 15.0 g/dL   HCT 32.5 (L) 36.0 - 46.0 %   MCV 98.2 80.0 - 100.0 fL   MCH 32.0 26.0 - 34.0 pg   MCHC 32.6 30.0 - 36.0 g/dL   RDW 15.4 11.5 - 15.5 %   Platelets 230 150 - 400 K/uL   nRBC 0.0 0.0 - 0.2 %    Comment: Performed at [facility], [place] [address], [place], [place] [address]  Renal function  panel     Status: Abnormal   Collection Time: 09/11/20  2:06 PM  Result Value Ref Range   Sodium 125 (L) 135 - 145 mmol/L   Potassium 3.7 3.5 - 5.1 mmol/L   Chloride 95 (L) 98 - 111 mmol/L   CO2 25 22 - 32 mmol/L   Glucose, Bld 121 (H) 70 - 99 mg/dL    Comment: Glucose reference range applies only to samples taken after fasting for at least 8 hours.   BUN 16 8 - 23 mg/dL   Creatinine, Ser 2.65 (H) 0.44 - 1.00 mg/dL   Calcium 8.1 (L) 8.9 - 10.3 mg/dL   Phosphorus 2.9 2.5 - 4.6 mg/dL   Albumin 1.5 (L) 3.5 - 5.0 g/dL   GFR, Estimated 19 (L) >60 mL/min    Comment: (NOTE) Calculated using the CKD-EPI Creatinine Equation (2021)    Anion gap 5 5 - 15    Comment: Performed at [facility] [address], [place], [place] [address]  Renal function panel     Status: Abnormal   Collection Time: 09/12/20  5:46 AM  Result Value Ref Range   Sodium 127 (L) 135 - 145 mmol/L   Potassium 3.5 3.5 - 5.1 mmol/L   Chloride 97 (L) 98 - 111 mmol/L   CO2 26 22 - 32 mmol/L   Glucose, Bld 83 70 - 99 mg/dL    Comment: Glucose reference range applies only to samples taken after fasting for at least 8 hours.   BUN 7 (L) 8 - 23 mg/dL   Creatinine, Ser 1.52 (H) 0.44 - 1.00 mg/dL    Comment: DELTA CHECK NOTED   Calcium 7.8 (L) 8.9 - 10.3 mg/dL   Phosphorus 2.2 (L) 2.5 - 4.6 mg/dL   Albumin 1.3 (L) 3.5 - 5.0 g/dL   GFR, Estimated 36 (L) >60 mL/min    Comment: (NOTE) Calculated using the CKD-EPI Creatinine Equation (2021)    Anion gap 4 (L) 5 - 15    Comment: Performed at [facility] [address], [place], [place] [address]  Renal function panel     Status: Abnormal   Collection Time: 09/13/20  3:11 PM  Result Value Ref Range   Sodium 124 (L) 135 - 145 mmol/L   Potassium 3.3 (L) 3.5 - 5.1 mmol/L   Chloride 94 (L) 98 - 111 mmol/L   CO2 25 22 - 32 mmol/L   Glucose, Bld 133 (H) 70 -  99 mg/dL    Comment: Glucose reference range applies only to samples taken after fasting for at least 8  hours.   BUN 14 8 - 23 mg/dL   Creatinine, Ser 2.58 (H) 0.44 - 1.00 mg/dL    Comment: DELTA CHECK NOTED   Calcium 8.1 (L) 8.9 - 10.3 mg/dL   Phosphorus 2.4 (L) 2.5 - 4.6 mg/dL   Albumin 1.4 (L) 3.5 - 5.0 g/dL   GFR, Estimated 19 (L) >60 mL/min    Comment: (NOTE) Calculated using the CKD-EPI Creatinine Equation (2021)    Anion gap 5 5 - 15    Comment: Performed at [facility] [address], [place], [place] [address]  CBC     Status: Abnormal   Collection Time: 09/13/20  3:11 PM  Result Value Ref Range   WBC 8.0 4.0 - 10.5 K/uL   RBC 3.14 (L) 3.87 - 5.11 MIL/uL   Hemoglobin 10.2 (L) 12.0 - 15.0 g/dL   HCT 30.8 (L) 36.0 - 46.0 %   MCV 98.1 80.0 - 100.0 fL   MCH 32.5 26.0 - 34.0 pg   MCHC 33.1 30.0 - 36.0 g/dL   RDW 15.3 11.5 - 15.5 %   Platelets 221 150 - 400 K/uL   nRBC 0.0 0.0 - 0.2 %    Comment: Performed at [facility], [place] [address], [place], [place] [address]  SARS CORONAVIRUS 2 (TAT 6-24 HRS) Nasopharyngeal Nasopharyngeal Swab     Status: None   Collection Time: 09/14/20 12:00 PM   Specimen: Nasopharyngeal Swab  Result Value Ref Range   SARS Coronavirus 2 NEGATIVE NEGATIVE    Comment: (NOTE) SARS-CoV-2 target nucleic acids are NOT DETECTED.  The SARS-CoV-2 RNA is generally detectable in upper and lower respiratory specimens during the acute phase of infection. Negative results do not preclude SARS-CoV-2 infection, do not rule out co-infections with other pathogens, and should not be used as the sole basis for treatment or other patient management decisions. Negative results must be combined with clinical observations, patient history, and epidemiological information. The expected result is Negative.  Fact Sheet for Patients: SugarRoll.be  Fact Sheet for Healthcare Providers: https://www.woods-mathews.com/  This test is not yet approved or cleared by the [place] FDA and  has been authorized for  detection and/or diagnosis of SARS-CoV-2 by FDA under an Emergency Use Authorization (EUA). This EUA will remain  in effect (meaning this test can be used) for the duration of the COVID-19 declaration under Se ction 564(b)(1) of the Act, 21 U.S.C. section 360bbb-3(b)(1), unless the authorization is terminated or revoked sooner.  Performed at [facility], [place] [address], [place], [place] [address]   CBC     Status: Abnormal   Collection Time: 09/15/20  7:51 AM  Result Value Ref Range   WBC 6.7 4.0 - 10.5 K/uL   RBC 3.46 (L) 3.87 - 5.11 MIL/uL   Hemoglobin 11.2 (L) 12.0 - 15.0 g/dL   HCT 33.8 (L) 36.0 - 46.0 %   MCV 97.7 80.0 - 100.0 fL   MCH 32.4 26.0 - 34.0 pg   MCHC 33.1 30.0 - 36.0 g/dL   RDW 15.1 11.5 - 15.5 %   Platelets 222 150 - 400 K/uL   nRBC 0.0 0.0 - 0.2 %    Comment: Performed at [facility], [place] [address], [place], [place] [address]    Assessment/Plan:  No problem-specific Assessment & Plan notes found for this encounter.  Leotis Pain 10/10/2020, 9:19 AM   This note was created with Dragon medical transcription system.  Any errors from dictation are unintentional.

## 2020-10-10 NOTE — Assessment & Plan Note (Signed)
This is a major contributing factor to her whole body swelling.

## 2020-10-10 NOTE — Assessment & Plan Note (Signed)
blood pressure control important in reducing the progression of atherosclerotic disease. On appropriate oral medications.  

## 2020-10-10 NOTE — Assessment & Plan Note (Signed)
The patient has swelling of all 4 extremities actually.  Her arm swelling is actually better than her leg swelling at this point.  Her swelling is multifactorial from severe protein calorie malnutrition, end-stage renal disease, and likely heart failure.  We were going to wrap her in Unna boots today on both lower extremities.  3 layer Unna boots were placed to help get the swelling under control and heal the skin.  Her arm swelling is actually not that severe at this point.  Elevation and compression on the arms to be of help as well.  We discussed a venogram to assess for central venous stenosis as a potential cause of her arm swelling, she does not want to have that done currently and that is certainly reasonable given the fact that the symptoms are fairly mild at this point.  Weekly Unna boots to be done at her facility over the next 4 weeks and we will see her back to reassess her legs.

## 2020-10-10 NOTE — Assessment & Plan Note (Signed)
lipid control important in reducing the progression of atherosclerotic disease. Continue statin therapy  

## 2020-10-10 NOTE — Assessment & Plan Note (Signed)
This is a major factor for her whole body swelling and her volume overload is significant.  We discussed that her central venous catheter could be creating a stenosis in the superior vena cava exacerbating her arm swelling.  At this point, she says her symptoms are not severe enough to consider venogram with intervention which we did discuss today.  We can reassess this going forward.

## 2020-10-13 DIAGNOSIS — Z992 Dependence on renal dialysis: Secondary | ICD-10-CM | POA: Diagnosis not present

## 2020-10-13 DIAGNOSIS — D509 Iron deficiency anemia, unspecified: Secondary | ICD-10-CM | POA: Diagnosis not present

## 2020-10-13 DIAGNOSIS — N2581 Secondary hyperparathyroidism of renal origin: Secondary | ICD-10-CM | POA: Diagnosis not present

## 2020-10-13 DIAGNOSIS — D631 Anemia in chronic kidney disease: Secondary | ICD-10-CM | POA: Diagnosis not present

## 2020-10-13 DIAGNOSIS — N186 End stage renal disease: Secondary | ICD-10-CM | POA: Diagnosis not present

## 2020-10-15 DIAGNOSIS — D509 Iron deficiency anemia, unspecified: Secondary | ICD-10-CM | POA: Diagnosis not present

## 2020-10-15 DIAGNOSIS — N186 End stage renal disease: Secondary | ICD-10-CM | POA: Diagnosis not present

## 2020-10-15 DIAGNOSIS — D631 Anemia in chronic kidney disease: Secondary | ICD-10-CM | POA: Diagnosis not present

## 2020-10-15 DIAGNOSIS — N2581 Secondary hyperparathyroidism of renal origin: Secondary | ICD-10-CM | POA: Diagnosis not present

## 2020-10-15 DIAGNOSIS — Z992 Dependence on renal dialysis: Secondary | ICD-10-CM | POA: Diagnosis not present

## 2020-10-16 ENCOUNTER — Encounter: Payer: Self-pay | Admitting: Oncology

## 2020-10-16 DIAGNOSIS — N186 End stage renal disease: Secondary | ICD-10-CM | POA: Diagnosis not present

## 2020-10-16 DIAGNOSIS — R569 Unspecified convulsions: Secondary | ICD-10-CM | POA: Diagnosis not present

## 2020-10-16 DIAGNOSIS — F39 Unspecified mood [affective] disorder: Secondary | ICD-10-CM | POA: Diagnosis not present

## 2020-10-16 DIAGNOSIS — S065X0A Traumatic subdural hemorrhage without loss of consciousness, initial encounter: Secondary | ICD-10-CM | POA: Diagnosis not present

## 2020-10-16 DIAGNOSIS — I2510 Atherosclerotic heart disease of native coronary artery without angina pectoris: Secondary | ICD-10-CM | POA: Diagnosis not present

## 2020-10-16 DIAGNOSIS — K219 Gastro-esophageal reflux disease without esophagitis: Secondary | ICD-10-CM | POA: Diagnosis not present

## 2020-10-17 ENCOUNTER — Encounter: Payer: Medicare Other | Attending: Registered Nurse | Admitting: Registered Nurse

## 2020-10-17 DIAGNOSIS — N186 End stage renal disease: Secondary | ICD-10-CM | POA: Diagnosis not present

## 2020-10-17 DIAGNOSIS — Z992 Dependence on renal dialysis: Secondary | ICD-10-CM | POA: Diagnosis not present

## 2020-10-17 DIAGNOSIS — D509 Iron deficiency anemia, unspecified: Secondary | ICD-10-CM | POA: Diagnosis not present

## 2020-10-17 DIAGNOSIS — D631 Anemia in chronic kidney disease: Secondary | ICD-10-CM | POA: Diagnosis not present

## 2020-10-17 DIAGNOSIS — N2581 Secondary hyperparathyroidism of renal origin: Secondary | ICD-10-CM | POA: Diagnosis not present

## 2020-10-20 DIAGNOSIS — D631 Anemia in chronic kidney disease: Secondary | ICD-10-CM | POA: Diagnosis not present

## 2020-10-20 DIAGNOSIS — Z992 Dependence on renal dialysis: Secondary | ICD-10-CM | POA: Diagnosis not present

## 2020-10-20 DIAGNOSIS — D509 Iron deficiency anemia, unspecified: Secondary | ICD-10-CM | POA: Diagnosis not present

## 2020-10-20 DIAGNOSIS — N186 End stage renal disease: Secondary | ICD-10-CM | POA: Diagnosis not present

## 2020-10-20 DIAGNOSIS — N2581 Secondary hyperparathyroidism of renal origin: Secondary | ICD-10-CM | POA: Diagnosis not present

## 2020-10-22 DIAGNOSIS — N186 End stage renal disease: Secondary | ICD-10-CM | POA: Diagnosis not present

## 2020-10-22 DIAGNOSIS — D509 Iron deficiency anemia, unspecified: Secondary | ICD-10-CM | POA: Diagnosis not present

## 2020-10-22 DIAGNOSIS — N2581 Secondary hyperparathyroidism of renal origin: Secondary | ICD-10-CM | POA: Diagnosis not present

## 2020-10-22 DIAGNOSIS — D631 Anemia in chronic kidney disease: Secondary | ICD-10-CM | POA: Diagnosis not present

## 2020-10-22 DIAGNOSIS — Z992 Dependence on renal dialysis: Secondary | ICD-10-CM | POA: Diagnosis not present

## 2020-10-22 NOTE — Telephone Encounter (Signed)
Unable to reach patient after multiple attempts .  Sent Amy [name].  Closing encounter.

## 2020-10-24 DIAGNOSIS — Z992 Dependence on renal dialysis: Secondary | ICD-10-CM | POA: Diagnosis not present

## 2020-10-24 DIAGNOSIS — D631 Anemia in chronic kidney disease: Secondary | ICD-10-CM | POA: Diagnosis not present

## 2020-10-24 DIAGNOSIS — N186 End stage renal disease: Secondary | ICD-10-CM | POA: Diagnosis not present

## 2020-10-24 DIAGNOSIS — N2581 Secondary hyperparathyroidism of renal origin: Secondary | ICD-10-CM | POA: Diagnosis not present

## 2020-10-24 DIAGNOSIS — D509 Iron deficiency anemia, unspecified: Secondary | ICD-10-CM | POA: Diagnosis not present

## 2020-10-27 DIAGNOSIS — N186 End stage renal disease: Secondary | ICD-10-CM | POA: Diagnosis not present

## 2020-10-27 DIAGNOSIS — D509 Iron deficiency anemia, unspecified: Secondary | ICD-10-CM | POA: Diagnosis not present

## 2020-10-27 DIAGNOSIS — Z992 Dependence on renal dialysis: Secondary | ICD-10-CM | POA: Diagnosis not present

## 2020-10-27 DIAGNOSIS — N2581 Secondary hyperparathyroidism of renal origin: Secondary | ICD-10-CM | POA: Diagnosis not present

## 2020-10-27 DIAGNOSIS — D631 Anemia in chronic kidney disease: Secondary | ICD-10-CM | POA: Diagnosis not present

## 2020-10-29 DIAGNOSIS — D509 Iron deficiency anemia, unspecified: Secondary | ICD-10-CM | POA: Diagnosis not present

## 2020-10-29 DIAGNOSIS — N186 End stage renal disease: Secondary | ICD-10-CM | POA: Diagnosis not present

## 2020-10-29 DIAGNOSIS — Z992 Dependence on renal dialysis: Secondary | ICD-10-CM | POA: Diagnosis not present

## 2020-10-29 DIAGNOSIS — N2581 Secondary hyperparathyroidism of renal origin: Secondary | ICD-10-CM | POA: Diagnosis not present

## 2020-10-29 DIAGNOSIS — D631 Anemia in chronic kidney disease: Secondary | ICD-10-CM | POA: Diagnosis not present

## 2020-10-30 DIAGNOSIS — Z992 Dependence on renal dialysis: Secondary | ICD-10-CM | POA: Diagnosis not present

## 2020-10-30 DIAGNOSIS — N186 End stage renal disease: Secondary | ICD-10-CM | POA: Diagnosis not present

## 2020-10-31 DIAGNOSIS — N2581 Secondary hyperparathyroidism of renal origin: Secondary | ICD-10-CM | POA: Diagnosis not present

## 2020-10-31 DIAGNOSIS — D509 Iron deficiency anemia, unspecified: Secondary | ICD-10-CM | POA: Diagnosis not present

## 2020-10-31 DIAGNOSIS — Z992 Dependence on renal dialysis: Secondary | ICD-10-CM | POA: Diagnosis not present

## 2020-10-31 DIAGNOSIS — N186 End stage renal disease: Secondary | ICD-10-CM | POA: Diagnosis not present

## 2020-10-31 DIAGNOSIS — D631 Anemia in chronic kidney disease: Secondary | ICD-10-CM | POA: Diagnosis not present

## 2020-11-03 DIAGNOSIS — N2581 Secondary hyperparathyroidism of renal origin: Secondary | ICD-10-CM | POA: Diagnosis not present

## 2020-11-03 DIAGNOSIS — D509 Iron deficiency anemia, unspecified: Secondary | ICD-10-CM | POA: Diagnosis not present

## 2020-11-03 DIAGNOSIS — Z992 Dependence on renal dialysis: Secondary | ICD-10-CM | POA: Diagnosis not present

## 2020-11-03 DIAGNOSIS — D631 Anemia in chronic kidney disease: Secondary | ICD-10-CM | POA: Diagnosis not present

## 2020-11-03 DIAGNOSIS — N186 End stage renal disease: Secondary | ICD-10-CM | POA: Diagnosis not present

## 2020-11-05 DIAGNOSIS — D509 Iron deficiency anemia, unspecified: Secondary | ICD-10-CM | POA: Diagnosis not present

## 2020-11-05 DIAGNOSIS — N2581 Secondary hyperparathyroidism of renal origin: Secondary | ICD-10-CM | POA: Diagnosis not present

## 2020-11-05 DIAGNOSIS — D631 Anemia in chronic kidney disease: Secondary | ICD-10-CM | POA: Diagnosis not present

## 2020-11-05 DIAGNOSIS — Z992 Dependence on renal dialysis: Secondary | ICD-10-CM | POA: Diagnosis not present

## 2020-11-05 DIAGNOSIS — N186 End stage renal disease: Secondary | ICD-10-CM | POA: Diagnosis not present

## 2020-11-06 ENCOUNTER — Encounter (INDEPENDENT_AMBULATORY_CARE_PROVIDER_SITE_OTHER): Payer: Self-pay | Admitting: Nurse Practitioner

## 2020-11-06 ENCOUNTER — Encounter (INDEPENDENT_AMBULATORY_CARE_PROVIDER_SITE_OTHER): Payer: Self-pay

## 2020-11-06 ENCOUNTER — Ambulatory Visit (INDEPENDENT_AMBULATORY_CARE_PROVIDER_SITE_OTHER): Payer: Medicare Other | Admitting: Nurse Practitioner

## 2020-11-07 ENCOUNTER — Emergency Department: Payer: Medicare Other

## 2020-11-07 ENCOUNTER — Inpatient Hospital Stay
Admission: EM | Admit: 2020-11-07 | Discharge: 2020-11-13 | DRG: 673 | Disposition: A | Discharge status: Skilled Nursing Facility | Payer: Medicare Other | Attending: Internal Medicine | Admitting: Internal Medicine

## 2020-11-07 ENCOUNTER — Other Ambulatory Visit: Payer: Self-pay

## 2020-11-07 ENCOUNTER — Encounter: Payer: Self-pay | Admitting: Emergency Medicine

## 2020-11-07 DIAGNOSIS — F419 Anxiety disorder, unspecified: Secondary | ICD-10-CM | POA: Diagnosis not present

## 2020-11-07 DIAGNOSIS — J811 Chronic pulmonary edema: Secondary | ICD-10-CM | POA: Diagnosis not present

## 2020-11-07 DIAGNOSIS — R278 Other lack of coordination: Secondary | ICD-10-CM | POA: Diagnosis not present

## 2020-11-07 DIAGNOSIS — N186 End stage renal disease: Secondary | ICD-10-CM

## 2020-11-07 DIAGNOSIS — Z882 Allergy status to sulfonamides status: Secondary | ICD-10-CM | POA: Diagnosis not present

## 2020-11-07 DIAGNOSIS — I10 Essential (primary) hypertension: Secondary | ICD-10-CM

## 2020-11-07 DIAGNOSIS — R4189 Other symptoms and signs involving cognitive functions and awareness: Secondary | ICD-10-CM | POA: Diagnosis not present

## 2020-11-07 DIAGNOSIS — E785 Hyperlipidemia, unspecified: Secondary | ICD-10-CM

## 2020-11-07 DIAGNOSIS — E876 Hypokalemia: Secondary | ICD-10-CM | POA: Diagnosis present

## 2020-11-07 DIAGNOSIS — R112 Nausea with vomiting, unspecified: Secondary | ICD-10-CM | POA: Diagnosis not present

## 2020-11-07 DIAGNOSIS — E871 Hypo-osmolality and hyponatremia: Secondary | ICD-10-CM | POA: Diagnosis present

## 2020-11-07 DIAGNOSIS — R601 Generalized edema: Secondary | ICD-10-CM

## 2020-11-07 DIAGNOSIS — R609 Edema, unspecified: Secondary | ICD-10-CM | POA: Diagnosis not present

## 2020-11-07 DIAGNOSIS — I132 Hypertensive heart and chronic kidney disease with heart failure and with stage 5 chronic kidney disease, or end stage renal disease: Secondary | ICD-10-CM | POA: Diagnosis present

## 2020-11-07 DIAGNOSIS — K219 Gastro-esophageal reflux disease without esophagitis: Secondary | ICD-10-CM | POA: Diagnosis present

## 2020-11-07 DIAGNOSIS — T829XXA Unspecified complication of cardiac and vascular prosthetic device, implant and graft, initial encounter: Secondary | ICD-10-CM | POA: Diagnosis not present

## 2020-11-07 DIAGNOSIS — E042 Nontoxic multinodular goiter: Secondary | ICD-10-CM | POA: Diagnosis present

## 2020-11-07 DIAGNOSIS — Y841 Kidney dialysis as the cause of abnormal reaction of the patient, or of later complication, without mention of misadventure at the time of the procedure: Secondary | ICD-10-CM | POA: Diagnosis present

## 2020-11-07 DIAGNOSIS — Z741 Need for assistance with personal care: Secondary | ICD-10-CM | POA: Diagnosis not present

## 2020-11-07 DIAGNOSIS — Z955 Presence of coronary angioplasty implant and graft: Secondary | ICD-10-CM

## 2020-11-07 DIAGNOSIS — G40909 Epilepsy, unspecified, not intractable, without status epilepticus: Secondary | ICD-10-CM | POA: Diagnosis present

## 2020-11-07 DIAGNOSIS — E878 Other disorders of electrolyte and fluid balance, not elsewhere classified: Secondary | ICD-10-CM | POA: Diagnosis present

## 2020-11-07 DIAGNOSIS — Z888 Allergy status to other drugs, medicaments and biological substances status: Secondary | ICD-10-CM | POA: Diagnosis not present

## 2020-11-07 DIAGNOSIS — Z20822 Contact with and (suspected) exposure to covid-19: Secondary | ICD-10-CM | POA: Diagnosis present

## 2020-11-07 DIAGNOSIS — I252 Old myocardial infarction: Secondary | ICD-10-CM

## 2020-11-07 DIAGNOSIS — D631 Anemia in chronic kidney disease: Secondary | ICD-10-CM | POA: Diagnosis present

## 2020-11-07 DIAGNOSIS — I120 Hypertensive chronic kidney disease with stage 5 chronic kidney disease or end stage renal disease: Secondary | ICD-10-CM | POA: Diagnosis not present

## 2020-11-07 DIAGNOSIS — Z79899 Other long term (current) drug therapy: Secondary | ICD-10-CM

## 2020-11-07 DIAGNOSIS — N2581 Secondary hyperparathyroidism of renal origin: Secondary | ICD-10-CM | POA: Diagnosis present

## 2020-11-07 DIAGNOSIS — Z880 Allergy status to penicillin: Secondary | ICD-10-CM

## 2020-11-07 DIAGNOSIS — M6281 Muscle weakness (generalized): Secondary | ICD-10-CM | POA: Diagnosis not present

## 2020-11-07 DIAGNOSIS — E8770 Fluid overload, unspecified: Secondary | ICD-10-CM | POA: Diagnosis present

## 2020-11-07 DIAGNOSIS — S065X0D Traumatic subdural hemorrhage without loss of consciousness, subsequent encounter: Secondary | ICD-10-CM | POA: Diagnosis not present

## 2020-11-07 DIAGNOSIS — I5022 Chronic systolic (congestive) heart failure: Secondary | ICD-10-CM | POA: Diagnosis not present

## 2020-11-07 DIAGNOSIS — Z992 Dependence on renal dialysis: Secondary | ICD-10-CM | POA: Diagnosis not present

## 2020-11-07 DIAGNOSIS — T829XXD Unspecified complication of cardiac and vascular prosthetic device, implant and graft, subsequent encounter: Secondary | ICD-10-CM | POA: Diagnosis not present

## 2020-11-07 DIAGNOSIS — R519 Headache, unspecified: Secondary | ICD-10-CM | POA: Diagnosis present

## 2020-11-07 DIAGNOSIS — A0472 Enterocolitis due to Clostridium difficile, not specified as recurrent: Secondary | ICD-10-CM | POA: Diagnosis not present

## 2020-11-07 DIAGNOSIS — I2510 Atherosclerotic heart disease of native coronary artery without angina pectoris: Secondary | ICD-10-CM | POA: Diagnosis present

## 2020-11-07 DIAGNOSIS — R110 Nausea: Secondary | ICD-10-CM | POA: Diagnosis not present

## 2020-11-07 DIAGNOSIS — R1110 Vomiting, unspecified: Secondary | ICD-10-CM | POA: Diagnosis not present

## 2020-11-07 DIAGNOSIS — J810 Acute pulmonary edema: Secondary | ICD-10-CM | POA: Diagnosis not present

## 2020-11-07 DIAGNOSIS — S065X9D Traumatic subdural hemorrhage with loss of consciousness of unspecified duration, subsequent encounter: Secondary | ICD-10-CM | POA: Diagnosis not present

## 2020-11-07 DIAGNOSIS — M2550 Pain in unspecified joint: Secondary | ICD-10-CM | POA: Diagnosis not present

## 2020-11-07 DIAGNOSIS — I5023 Acute on chronic systolic (congestive) heart failure: Secondary | ICD-10-CM | POA: Diagnosis present

## 2020-11-07 DIAGNOSIS — R1111 Vomiting without nausea: Secondary | ICD-10-CM | POA: Diagnosis not present

## 2020-11-07 DIAGNOSIS — Z7401 Bed confinement status: Secondary | ICD-10-CM | POA: Diagnosis not present

## 2020-11-07 DIAGNOSIS — W19XXXD Unspecified fall, subsequent encounter: Secondary | ICD-10-CM | POA: Diagnosis not present

## 2020-11-07 DIAGNOSIS — Z452 Encounter for adjustment and management of vascular access device: Secondary | ICD-10-CM | POA: Diagnosis not present

## 2020-11-07 DIAGNOSIS — T85691A Other mechanical complication of intraperitoneal dialysis catheter, initial encounter: Secondary | ICD-10-CM | POA: Diagnosis not present

## 2020-11-07 DIAGNOSIS — T8249XA Other complication of vascular dialysis catheter, initial encounter: Secondary | ICD-10-CM | POA: Diagnosis not present

## 2020-11-07 DIAGNOSIS — J90 Pleural effusion, not elsewhere classified: Secondary | ICD-10-CM | POA: Diagnosis not present

## 2020-11-07 DIAGNOSIS — Z883 Allergy status to other anti-infective agents status: Secondary | ICD-10-CM

## 2020-11-07 DIAGNOSIS — Z8249 Family history of ischemic heart disease and other diseases of the circulatory system: Secondary | ICD-10-CM

## 2020-11-07 DIAGNOSIS — Z7982 Long term (current) use of aspirin: Secondary | ICD-10-CM

## 2020-11-07 DIAGNOSIS — D638 Anemia in other chronic diseases classified elsewhere: Secondary | ICD-10-CM | POA: Diagnosis not present

## 2020-11-07 LAB — CBC
HCT: 33.2 % — ABNORMAL LOW (ref 36.0–46.0)
Hemoglobin: 10.1 g/dL — ABNORMAL LOW (ref 12.0–15.0)
MCH: 27.3 pg (ref 26.0–34.0)
MCHC: 30.4 g/dL (ref 30.0–36.0)
MCV: 89.7 fL (ref 80.0–100.0)
Platelets: 350 10*3/uL (ref 150–400)
RBC: 3.7 MIL/uL — ABNORMAL LOW (ref 3.87–5.11)
RDW: 15.3 % (ref 11.5–15.5)
WBC: 10.4 10*3/uL (ref 4.0–10.5)
nRBC: 0 % (ref 0.0–0.2)

## 2020-11-07 LAB — BASIC METABOLIC PANEL
Anion gap: 8 (ref 5–15)
BUN: 34 mg/dL — ABNORMAL HIGH (ref 8–23)
CO2: 18 mmol/L — ABNORMAL LOW (ref 22–32)
Calcium: 7.8 mg/dL — ABNORMAL LOW (ref 8.9–10.3)
Chloride: 96 mmol/L — ABNORMAL LOW (ref 98–111)
Creatinine, Ser: 3.2 mg/dL — ABNORMAL HIGH (ref 0.44–1.00)
GFR, Estimated: 15 mL/min — ABNORMAL LOW (ref >60)
Glucose, Bld: 98 mg/dL (ref 70–99)
Potassium: 4.3 mmol/L (ref 3.5–5.1)
Sodium: 122 mmol/L — ABNORMAL LOW (ref 135–145)

## 2020-11-07 LAB — RESP PANEL BY RT-PCR (FLU A&B, COVID) ARPGX2
Influenza A by PCR: NEGATIVE
Influenza B by PCR: NEGATIVE
SARS Coronavirus 2 by RT PCR: NEGATIVE

## 2020-11-07 LAB — BRAIN NATRIURETIC PEPTIDE: B Natriuretic Peptide: 129.5 pg/mL — ABNORMAL HIGH (ref 0.0–100.0)

## 2020-11-07 LAB — TROPONIN I (HIGH SENSITIVITY): Troponin I (High Sensitivity): 8 ng/L (ref <18)

## 2020-11-07 MED ORDER — ACETAMINOPHEN 325 MG PO TABS
650.0000 mg | ORAL_TABLET | Freq: Four times a day (QID) | ORAL | Status: DC | PRN
  Administered 2020-11-08: 325 mg via ORAL
  Administered 2020-11-09 – 2020-11-13 (×2): 650 mg via ORAL
  Filled 2020-11-07 (×3): qty 2

## 2020-11-07 MED ORDER — CHLORHEXIDINE GLUCONATE CLOTH 2 % EX PADS
6.0000 | MEDICATED_PAD | Freq: Every day | CUTANEOUS | Status: DC
  Administered 2020-11-08 – 2020-11-13 (×5): 6 via TOPICAL

## 2020-11-07 MED ORDER — MELATONIN 5 MG PO TABS
5.0000 mg | ORAL_TABLET | Freq: Every day | ORAL | Status: DC
  Administered 2020-11-07 – 2020-11-12 (×6): 5 mg via ORAL
  Filled 2020-11-07 (×6): qty 1

## 2020-11-07 MED ORDER — PANTOPRAZOLE SODIUM 40 MG PO TBEC
40.0000 mg | DELAYED_RELEASE_TABLET | Freq: Every day | ORAL | Status: DC
  Administered 2020-11-08 – 2020-11-13 (×7): 40 mg via ORAL
  Filled 2020-11-07 (×6): qty 1

## 2020-11-07 MED ORDER — CAMPHOR-MENTHOL 0.5-0.5 % EX LOTN
TOPICAL_LOTION | CUTANEOUS | Status: DC | PRN
  Filled 2020-11-07: qty 222

## 2020-11-07 MED ORDER — ACETAMINOPHEN 650 MG RE SUPP: 650.0000 mg | Freq: Four times a day (QID) | RECTAL | Status: DC | PRN

## 2020-11-07 MED ORDER — BUTALBITAL-APAP-CAFFEINE 50-325-40 MG PO TABS
1.0000 | ORAL_TABLET | Freq: Three times a day (TID) | ORAL | Status: DC | PRN
  Administered 2020-11-08 – 2020-11-13 (×9): 1 via ORAL
  Filled 2020-11-07 (×9): qty 1

## 2020-11-07 MED ORDER — MAGNESIUM HYDROXIDE 400 MG/5ML PO SUSP: 30.0000 mL | Freq: Every day | ORAL | Status: DC | PRN

## 2020-11-07 MED ORDER — PROCHLORPERAZINE MALEATE 10 MG PO TABS
10.0000 mg | ORAL_TABLET | Freq: Three times a day (TID) | ORAL | Status: DC | PRN
  Filled 2020-11-07: qty 1

## 2020-11-07 MED ORDER — FUROSEMIDE 10 MG/ML IJ SOLN
60.0000 mg | Freq: Two times a day (BID) | INTRAMUSCULAR | Status: DC
  Administered 2020-11-07 – 2020-11-13 (×7): 60 mg via INTRAVENOUS
  Filled 2020-11-07 (×6): qty 8
  Filled 2020-11-07: qty 6
  Filled 2020-11-07 (×3): qty 8

## 2020-11-07 MED ORDER — ATORVASTATIN CALCIUM 20 MG PO TABS: 40.0000 mg | ORAL_TABLET | Freq: Every day | ORAL | Status: DC

## 2020-11-07 MED ORDER — HEPARIN SODIUM (PORCINE) 5000 UNIT/ML IJ SOLN
5000.0000 [IU] | Freq: Three times a day (TID) | INTRAMUSCULAR | Status: AC
  Administered 2020-11-07 – 2020-11-10 (×7): 5000 [IU] via SUBCUTANEOUS
  Filled 2020-11-07 (×7): qty 1

## 2020-11-07 MED ORDER — ATORVASTATIN CALCIUM 20 MG PO TABS
40.0000 mg | ORAL_TABLET | Freq: Every day | ORAL | Status: DC
  Administered 2020-11-08 – 2020-11-13 (×7): 40 mg via ORAL
  Filled 2020-11-07 (×7): qty 2

## 2020-11-07 MED ORDER — ASPIRIN 81 MG PO CHEW
81.0000 mg | CHEWABLE_TABLET | Freq: Every day | ORAL | Status: DC
  Administered 2020-11-08 – 2020-11-10 (×4): 81 mg via ORAL
  Filled 2020-11-07 (×4): qty 1

## 2020-11-07 MED ORDER — METOCLOPRAMIDE HCL 5 MG PO TABS
5.0000 mg | ORAL_TABLET | Freq: Every day | ORAL | Status: DC
  Administered 2020-11-08 – 2020-11-13 (×5): 5 mg via ORAL
  Filled 2020-11-07 (×5): qty 1

## 2020-11-07 MED ORDER — RENA-VITE PO TABS
1.0000 | ORAL_TABLET | Freq: Every day | ORAL | Status: DC
  Administered 2020-11-08 – 2020-11-12 (×6): 1 via ORAL
  Filled 2020-11-07 (×7): qty 1

## 2020-11-07 MED ORDER — METHOCARBAMOL 500 MG PO TABS
500.0000 mg | ORAL_TABLET | Freq: Four times a day (QID) | ORAL | Status: DC | PRN
  Filled 2020-11-07: qty 1

## 2020-11-07 MED ORDER — DOCUSATE SODIUM 100 MG PO CAPS
100.0000 mg | ORAL_CAPSULE | Freq: Two times a day (BID) | ORAL | Status: DC
  Administered 2020-11-07 – 2020-11-13 (×12): 100 mg via ORAL
  Filled 2020-11-07 (×12): qty 1

## 2020-11-07 MED ORDER — TRAZODONE HCL 50 MG PO TABS: 25.0000 mg | ORAL_TABLET | Freq: Every evening | ORAL | Status: DC | PRN

## 2020-11-07 MED ORDER — ONDANSETRON HCL 4 MG PO TABS: 4.0000 mg | ORAL_TABLET | Freq: Four times a day (QID) | ORAL | Status: DC | PRN

## 2020-11-07 MED ORDER — DARBEPOETIN ALFA 60 MCG/0.3ML IJ SOSY
60.0000 ug | PREFILLED_SYRINGE | INTRAMUSCULAR | Status: DC
  Administered 2020-11-08: 60 ug via INTRAVENOUS
  Filled 2020-11-07 (×2): qty 0.3

## 2020-11-07 MED ORDER — ONDANSETRON HCL 4 MG/2ML IJ SOLN
4.0000 mg | Freq: Four times a day (QID) | INTRAMUSCULAR | Status: DC | PRN
  Filled 2020-11-07: qty 2

## 2020-11-07 MED ORDER — LACOSAMIDE 50 MG PO TABS
50.0000 mg | ORAL_TABLET | Freq: Two times a day (BID) | ORAL | Status: DC
  Administered 2020-11-07 – 2020-11-13 (×12): 50 mg via ORAL
  Filled 2020-11-07 (×12): qty 1

## 2020-11-07 MED ORDER — METOPROLOL TARTRATE 25 MG/10 ML ORAL SUSPENSION
6.2500 mg | Freq: Two times a day (BID) | ORAL | Status: DC
  Administered 2020-11-08 – 2020-11-12 (×10): 6.25 mg via ORAL
  Filled 2020-11-07 (×16): qty 2.5

## 2020-11-07 MED ORDER — BRIVARACETAM 25 MG PO TABS: 75.0000 mg | ORAL_TABLET | Freq: Two times a day (BID) | ORAL | Status: DC

## 2020-11-07 MED ORDER — TRAZODONE HCL 50 MG PO TABS
25.0000 mg | ORAL_TABLET | Freq: Every evening | ORAL | Status: DC | PRN
  Administered 2020-11-09 – 2020-11-12 (×4): 50 mg via ORAL
  Filled 2020-11-07 (×4): qty 1

## 2020-11-07 NOTE — H&P (Addendum)
[place]   PATIENT NAME: Amy [name]    MR#:  [id number]  DATE OF BIRTH:  Jun 28, 1950  DATE OF ADMISSION:  11/07/2020  PRIMARY CARE PHYSICIAN: Murlean Iba, MD   Patient is coming from: Home  REQUESTING/REFERRING PHYSICIAN: Hulan Saas, MD  CHIEF COMPLAINT:  Nonfunctioning right chest wall hemodialysis catheter. Worsening leg and arm swelling  HISTORY OF PRESENT ILLNESS:  Amy [name] is a 70 y.o. Caucasian female with medical history significant for end-stage renal disease on hemodialysis, anxiety, anemia, gastritis, GERD, multiple admissions with most recent for C. difficile colitis and non-STEMI status post PCI and recent acute on chronic SDH status post partial craniotomy and evacuation of SDH as well as hypertension, presenting to the emergency room with acute onset of nonfunctioning right chest Port-A-Cath.  She has been having worsening bilateral lower and upper extremity edema.  She has 2 pillow orthopnea and admits to dry cough without wheezing.  She denies any worsening shortness of breath at rest.  No fever or chills.  The patient has been having nausea without vomiting.  No chest pain or palpitations.  No dysuria, urinary frequency or urgency or hematuria or flank pain.  ED Course: When she came to the ER blood pressure was 150/85 with otherwise normal vital signs.  Labs revealed hyponatremia 122 and hypochloremia 96 with a BUN of 34 and creatinine of 3.2 and calcium 7.8.  CBC showed anemia close to baseline.  I respiratory panel is currently pending. EKG as reviewed by me : Showed normal sinus rhythm with a rate of 91 with low voltage QRS, poor R wave progression and Q waves inferiorly. Imaging: 2 view chest x-ray showed interstitial pulmonary edema with moderate right and small left pleural effusions.  The patient will be admitted to a progressive unit bed for further evaluation and management. PAST MEDICAL HISTORY:   Past Medical History:  Diagnosis Date    Anemia    a. 08/2016 Guaiac + stool. EGD w/ gastritis.   Anxiety    Closed fracture of shaft of left ulna 11/27/2018   Colon polyp    a. 08/2016 Colonoscopy.   ESRD on peritoneal dialysis ([place])    Gastritis    a. 08/2016 EGD: Gastritis-->PPI.   GERD (gastroesophageal reflux disease)    History of chicken pox    History of measles as a child    Hypertension    Hyponatremia    a. 08/2016 in setting of HCTZ Rx.   Leukocytosis    Mesenteric lymphadenopathy 04/08/2017   Multiple thyroid nodules     PAST SURGICAL HISTORY:   Past Surgical History:  Procedure Laterality Date   ABDOMINAL HYSTERECTOMY  2003   due to heavy periods and clotting during menses   BREAST LUMPECTOMY Left 1990   CHOLECYSTECTOMY  2010   COLONOSCOPY WITH PROPOFOL N/A 09/07/2016   Procedure: COLONOSCOPY WITH PROPOFOL;  Surgeon: Lucilla Lame, MD;  Location: [facility];  Service: Endoscopy;  Laterality: N/A;   CORONARY STENT INTERVENTION N/A 06/27/2020   Procedure: CORONARY STENT INTERVENTION;  Surgeon: Sherren Mocha, MD;  Location: [facility];  Service: Cardiovascular;  Laterality: N/A;   DIALYSIS/PERMA CATHETER INSERTION N/A 11/16/2018   Procedure: DIALYSIS/PERMA CATHETER INSERTION;  Surgeon: Algernon Huxley, MD;  Location: [facility];  Service: Cardiovascular;  Laterality: N/A;   DIALYSIS/PERMA CATHETER REMOVAL N/A 08/16/2019   Procedure: DIALYSIS/PERMA CATHETER REMOVAL;  Surgeon: Algernon Huxley, MD;  Location: [facility];  Service: Cardiovascular;  Laterality: N/A;   DILATION AND CURETTAGE OF UTERUS  1985   ESOPHAGOGASTRODUODENOSCOPY (EGD) WITH PROPOFOL N/A 09/07/2016   Procedure: ESOPHAGOGASTRODUODENOSCOPY (EGD) WITH PROPOFOL;  Surgeon: Lucilla Lame, MD;  Location: [facility];  Service: Endoscopy;  Laterality: N/A;   GALLBLADDER SURGERY  2012   INTRAMEDULLARY (IM) NAIL INTERTROCHANTERIC Right 04/23/2020   Procedure: INTRAMEDULLARY (IM) NAIL INTERTROCHANTRIC;  Surgeon: Thornton Park, MD;   Location: [facility];  Service: Orthopedics;  Laterality: Right;   INTRAVASCULAR IMAGING/OCT N/A 06/27/2020   Procedure: INTRAVASCULAR IMAGING/OCT;  Surgeon: Sherren Mocha, MD;  Location: [facility];  Service: Cardiovascular;  Laterality: N/A;   IR FLUORO GUIDE CV LINE RIGHT  09/04/2020   IR US GUIDE VASC ACCESS RIGHT  09/04/2020   KNEE SURGERY     Dr. Tamala Julian   LEFT HEART CATH AND CORONARY ANGIOGRAPHY N/A 06/25/2020   Procedure: LEFT HEART CATH AND CORONARY ANGIOGRAPHY;  Surgeon: Troy Sine, MD;  Location: [facility];  Service: Cardiovascular;  Laterality: N/A;   TONSILLECTOMY     TONSILLECTOMY AND ADENOIDECTOMY  1970   UPPER GI ENDOSCOPY  03/02/2006   H. Pylori negative; Dr. Allen Norris    SOCIAL HISTORY:   Social History   Tobacco Use   Smoking status: Never   Smokeless tobacco: Never  Substance Use Topics   Alcohol use: Yes    Alcohol/week: 0.0 - 2.0 standard drinks    FAMILY HISTORY:   Family History  Problem Relation Age of Onset   Pancreatic cancer Father    Congestive Heart Failure Maternal Grandmother    Cancer Paternal Grandfather    Heart disease Maternal Uncle     DRUG ALLERGIES:   Allergies  Allergen Reactions   Cephalosporins Hives and Swelling   Clarithromycin Other (See Comments)    Unknown reaction   Ensure [Nutritional Supplements] Nausea And Vomiting   Gabapentin    Lunesta  [Eszopiclone]     Heart racing   Nepro [Compleat] Nausea And Vomiting   Penicillin V     Has patient had a PCN reaction causing immediate rash, facial/tongue/throat swelling, SOB or lightheadedness with hypotension: Yes Has patient had a PCN reaction causing severe rash involving mucus membranes or skin necrosis: No Has patient had a PCN reaction that required hospitalization No Has patient had a PCN reaction occurring within the last 10 years: No If all of the above answers are "NO", then may proceed with Cephalosporin use.   Sulfa Antibiotics     Unknown reaction     REVIEW OF SYSTEMS:   ROS As per history of present illness. All pertinent systems were reviewed above. Constitutional, HEENT, cardiovascular, respiratory, GI, GU, musculoskeletal, neuro, psychiatric, endocrine, integumentary and hematologic systems were reviewed and are otherwise negative/unremarkable except for positive findings mentioned above in the HPI.   MEDICATIONS AT HOME:   Prior to Admission medications   Medication Sig Start Date End Date Taking? Authorizing Provider  acetaminophen (TYLENOL) 325 MG tablet Take 1-2 tablets (325-650 mg total) by mouth every 4 (four) hours as needed for mild pain. 09/12/20   Love, Ivan Anchors, PA-C  aspirin 81 MG chewable tablet Chew 1 tablet (81 mg total) by mouth daily. 06/30/20   Geradine Girt, DO  atorvastatin (LIPITOR) 40 MG tablet Take 1 tablet (40 mg total) by mouth daily. 09/13/20   Love, Ivan Anchors, PA-C  atorvastatin (LIPITOR) 40 MG tablet Take by mouth. 08/04/20 08/04/21  [provider]  brivaracetam (BRIVIACT) 25 MG TABS tablet Take 3 tablets (  75 mg total) by mouth 2 (two) times daily. 09/12/20   Love, Ivan Anchors, PA-C  butalbital-acetaminophen-caffeine (FIORICET) [phone] MG tablet Take 1 tablet by mouth 3 (three) times daily as needed for headache. 09/12/20   Love, Ivan Anchors, PA-C  camphor-menthol [facility]) lotion Apply topically as needed for itching. 09/12/20   Love, Ivan Anchors, PA-C  Chlorhexidine Gluconate Cloth 2 % PADS Apply 6 each topically daily. 09/13/20   Love, Ivan Anchors, PA-C  Darbepoetin Alfa (ARANESP) 60 MCG/0.3ML SOSY injection Inject 0.3 mLs (60 mcg total) into the vein every Saturday with hemodialysis. 09/20/20   Love, Ivan Anchors, PA-C  docusate sodium (COLACE) 100 MG capsule Take 1 capsule (100 mg total) by mouth 2 (two) times daily. 09/12/20   Love, Ivan Anchors, PA-C  gentamicin cream (GARAMYCIN) 0.1 % Apply 1 application topically daily. 07/24/20   Love, Ivan Anchors, PA-C  lacosamide (VIMPAT) 50 MG TABS tablet Take 1 tablet (50 mg total)  by mouth 2 (two) times daily. 09/12/20   Love, Ivan Anchors, PA-C  Melatonin 3 MG TBDP Take by mouth. 08/04/20   [provider]  melatonin 5 MG TABS Take 1 tablet (5 mg total) by mouth at bedtime. 09/12/20   Love, Ivan Anchors, PA-C  melatonin 5 MG TABS Take 1 tablet (5 mg total) by mouth at bedtime as needed. 09/12/20   Love, Ivan Anchors, PA-C  methocarbamol (ROBAXIN) 500 MG tablet Take 1 tablet (500 mg total) by mouth every 6 (six) hours as needed for muscle spasms. 09/12/20   Love, Ivan Anchors, PA-C  metoCLOPramide (REGLAN) 5 MG tablet Take 1 tablet (5 mg total) by mouth daily after breakfast. 09/16/20   Love, Ivan Anchors, PA-C  metoprolol tartrate (LOPRESSOR) 25 mg/10 mL SUSP Take 2.5 mLs (6.25 mg total) by mouth 2 (two) times daily. 09/12/20   Love, Ivan Anchors, PA-C  multivitamin (RENA-VIT) TABS tablet Take 1 tablet by mouth at bedtime. 09/12/20   Love, Ivan Anchors, PA-C  pantoprazole (PROTONIX) 40 MG tablet TAKE 1 TABLET (40 MG TOTAL) BY MOUTH DAILY. 06/30/20 06/30/21  Geradine Girt, DO  prochlorperazine (COMPAZINE) 10 MG tablet Take 1 tablet (10 mg total) by mouth every 8 (eight) hours as needed for nausea or vomiting. 07/24/20   Love, Ivan Anchors, PA-C  traZODone (DESYREL) 50 MG tablet Take 0.5-1 tablets (25-50 mg total) by mouth at bedtime as needed for sleep. 09/12/20   Love, Ivan Anchors, PA-C  triamcinolone cream (KENALOG) 0.1 % Apply topically 3 (three) times daily. 09/12/20   Love, Ivan Anchors, PA-C      VITAL SIGNS:  Blood pressure (!) 150/85, pulse 89, temperature 98.9 F (37.2 C), temperature source Oral, resp. rate 16, height 5\' 4"  (1.626 m), SpO2 97 %.  PHYSICAL EXAMINATION:  Physical Exam  GENERAL:  70 y.o.-year-old Caucasian female in patient lying in the bed with mild respiratory distress with mild conversational dyspnea. EYES: Pupils equal, round, reactive to light and accommodation. No scleral icterus. Extraocular muscles intact.  HEENT: Head atraumatic, normocephalic. Oropharynx and nasopharynx clear.   NECK:  Supple, no jugular venous distention. No thyroid enlargement, no tenderness.  LUNGS: Diminished bibasal breath sounds with bibasal and midlung zone rales. CARDIOVASCULAR: Regular rate and rhythm, S1, S2 normal. No murmurs, rubs, or gallops.  ABDOMEN: Soft, nondistended, nontender. Bowel sounds present. No organomegaly or mass.  EXTREMITIES: Bilateral upper and lower extremity 2-3+ pitting edema with no cyanosis, or clubbing.  NEUROLOGIC: Cranial nerves II through XII are intact. Muscle strength 5/5 in all  extremities. Sensation intact. Gait not checked.  PSYCHIATRIC: The patient is alert and oriented x 3.  Normal affect and good eye contact. SKIN: No obvious rash, lesion, or ulcer.   LABORATORY PANEL:   CBC Recent Labs  Lab 11/07/20 1415  WBC 10.4  HGB 10.1*  HCT 33.2*  PLT 350   ------------------------------------------------------------------------------------------------------------------  Chemistries  Recent Labs  Lab 11/07/20 1415  NA 122*  K 4.3  CL 96*  CO2 18*  GLUCOSE 98  BUN 34*  CREATININE 3.20*  CALCIUM 7.8*   ------------------------------------------------------------------------------------------------------------------  Cardiac Enzymes No results for input(s): TROPONINI in the last 168 hours. ------------------------------------------------------------------------------------------------------------------  RADIOLOGY:  DG Chest 2 View  Result Date: 11/07/2020 CLINICAL DATA:  Nausea and vomiting, edema, dialysis port not functioning EXAM: CHEST - 2 VIEW COMPARISON:  July 25, 2020 FINDINGS: Right chest dialysis catheter with tip overlying the superior cavoatrial junction. The heart size and mediastinal contours are similar prior. Moderate right and small left pleural effusion. Mild diffuse interstitial opacities. The visualized skeletal structures are unchanged. IMPRESSION: Interstitial edema with moderate right and small left pleural effusions.  Electronically Signed   By: Dahlia Bailiff MD   On: 11/07/2020 15:38      IMPRESSION AND PLAN:  Active Problems:   Pulmonary edema  1.  Acute on chronic systolic CHF with associated fluid overload in the setting of end-stage renal disease on hemodialysis with nonfunctioning right Port-A-Cath, pulmonary edema and anasarca. - The patient will be admitted to a progressive unit bed. - We will place for now on IV Lasix, mainly for pulmonary congestion. - Nephrology consult will be obtained. - Notify Dr. Holley Raring about the patient. - Her last EF was 25 to 30% with mild mitral regurgitation and trivial tricuspid regurgitation in February of this year.  2.  Dyslipidemia. - We will continue statin therapy.  3.  Seizure disorder. - We will continue Vimpat and Briviact.  4.  GERD. - We will continue PPI therapy.  5.  Coronary artery disease. - We will continue aspirin and statin therapy.  DVT prophylaxis: Subcutaneous heparin.  Code Status: full code. Family Communication:  The plan of care was discussed in details with the patient (and family). I answered all questions. The patient agreed to proceed with the above mentioned plan. Further management will depend upon hospital course. Disposition Plan: Back to previous home environment Consults called: Nephrology. All the records are reviewed and case discussed with ED provider.  Status is: Inpatient  Remains inpatient appropriate because:Ongoing diagnostic testing needed not appropriate for outpatient work up, Unsafe d/c plan, IV treatments appropriate due to intensity of illness or inability to take PO, and Inpatient level of care appropriate due to severity of illness  Dispo: The patient is from: SNF              Anticipated d/c is to: SNF              Patient currently is not medically stable to d/c.   Difficult to place patient No  TOTAL TIME TAKING CARE OF THIS PATIENT: 55 minutes.    Christel Mormon M.D on 11/07/2020 at 4:14  PM  [facility]   From 7 PM-7 AM, contact night-coverage [email or web address]  CC: Primary care physician; Murlean Iba, MD

## 2020-11-07 NOTE — ED Notes (Signed)
Report received from [place], [place]

## 2020-11-07 NOTE — ED Triage Notes (Signed)
Pt comes into the ED via EMS from [facility], c/o generalized 4+ pitting edema, last dialysis on Monday, vomiting yesterday 168/84 98.8temp 98%RA 80HR

## 2020-11-07 NOTE — ED Provider Notes (Signed)
[facility] Emergency Department Provider Note  ____________________________________________   Event Date/Time   First MD Initiated Contact with Patient 11/07/20 1502     (approximate)  I have reviewed the triage vital signs and the nursing notes.   HISTORY  Chief Complaint No chief complaint on file.   HPI Amy [name] is a 70 y.o. female with history of ESRD- on HD MWF (last dialyzed on M 7/4), chronic hyponatremia, chronic headaches, multiple admissions most recent for C. difficile colitis complicated by NSTEMI s/p PCI with DAPT, and recent acute on chronic SDH s/p partial craniotomy for evacuation of SDH who presents to EMS from SNF with concerns that her right chest Port-A-Cath is not functioning.  Patient states he felt little nauseous today but has not had any new shortness of breath, chest pain, cough, fevers, headache, back pain, abdominal pain, burning with urination, rash or extremity pain but does think her extremities are more swollen.  She states she still makes a small amount of urine but has not had any change in this.  She denies any recent injuries or other acute concerns at this time.  She states she is not sure what was wrong with her catheter.         Past Medical History:  Diagnosis Date   Anemia    a. 08/2016 Guaiac + stool. EGD w/ gastritis.   Anxiety    Closed fracture of shaft of left ulna 11/27/2018   Colon polyp    a. 08/2016 Colonoscopy.   ESRD on peritoneal dialysis ([place])    Gastritis    a. 08/2016 EGD: Gastritis-->PPI.   GERD (gastroesophageal reflux disease)    History of chicken pox    History of measles as a child    Hypertension    Hyponatremia    a. 08/2016 in setting of HCTZ Rx.   Leukocytosis    Mesenteric lymphadenopathy 04/08/2017   Multiple thyroid nodules     Patient Active Problem List   Diagnosis Date Noted   CAD (coronary artery disease) of artery bypass graft 09/15/2020   History of nausea and vomiting  09/15/2020   Sleep disturbance    SDH (subdural hematoma) ([place]) 08/07/2020   Acute on chronic anemia    Subdural hemorrhage following injury ([place]) 08/04/2020   Orthostasis    Gastroparesis    Chronic combined systolic and diastolic congestive heart failure (HCC)    Prolonged Q-T interval on ECG    Nausea    Hypoalbuminemia due to protein-calorie malnutrition (HCC)    Nausea and vomiting    Chronic combined systolic and diastolic CHF (congestive heart failure) (HCC)    Anemia of chronic disease    Seizures (HCC)    Chronic pain syndrome    Debility 07/04/2020   Ischemic cardiomyopathy    Protein-calorie malnutrition, severe 06/23/2020   Non-ST elevation (NSTEMI) myocardial infarction ([place])    Pressure injury of skin 06/21/2020   C. difficile diarrhea 06/19/2020   C. difficile colitis 06/19/2020   Seizure ([place]) 06/19/2020   Gastroenteritis due to COVID-19 virus 05/29/2020   Surgery, elective    S/P ORIF (open reduction internal fixation) fracture    Closed right hip fracture, initial encounter ([place]) 04/22/2020   Hypotension 12/16/2019   ESRD on dialysis ([place]) 12/16/2019   Depression with anxiety 12/16/2019   Hypomagnesemia 12/16/2019   Atrial flutter ([place])    Atrial fibrillation with RVR ([place]) 04/03/2019   Benign essential HTN 04/03/2019   HLD (hyperlipidemia) 04/03/2019  End stage renal disease ([place]) 12/13/2018   Itching 11/08/2018   Hyperparathyroidism, secondary renal ([place]) 09/13/2017   Ground glass opacity present on imaging of lung 04/08/2017   Osteoarthritis of knee 12/29/2016   Normocytic anemia 10/14/2016   Swelling of limb    Chronic headaches    Gastritis    History of adenomatous polyp of colon 09/07/2016   Chronic abdominal pain 09/05/2016   Iron deficiency anemia 09/05/2016   Hyponatremia 09/05/2016   Multiple thyroid nodules 07/04/2015   Right thyroid nodule 06/27/2015   Allergic rhinitis, seasonal 12/03/2014   Fever blister 12/03/2014   Herniated  lumbar intervertebral disc 12/03/2014   Hypokalemia 12/03/2014   Recurrent sinus infections 12/03/2014   Insomnia 11/05/2014   Acquired scoliosis 06/26/2014   Intervertebral disc stenosis of neural canal of lumbar region 06/20/2014   Lumbar facet arthropathy 05/21/2014   Degenerative spondylolisthesis 03/12/2014   OP (osteoporosis) 08/10/2013   Idiopathic peripheral neuropathy 03/24/2009   Edema 12/27/2007   Headache, tension-type 10/25/2006   Essential hypertension 10/24/2006   Anxiety 05/03/1998   Esophageal reflux 05/03/1998    Past Surgical History:  Procedure Laterality Date   ABDOMINAL HYSTERECTOMY  2003   due to heavy periods and clotting during menses   BREAST LUMPECTOMY Left 1990   CHOLECYSTECTOMY  2010   COLONOSCOPY WITH PROPOFOL N/A 09/07/2016   Procedure: COLONOSCOPY WITH PROPOFOL;  Surgeon: Lucilla Lame, MD;  Location: [facility] ENDOSCOPY;  Service: Endoscopy;  Laterality: N/A;   CORONARY STENT INTERVENTION N/A 06/27/2020   Procedure: CORONARY STENT INTERVENTION;  Surgeon: Sherren Mocha, MD;  Location: [place] CV LAB;  Service: Cardiovascular;  Laterality: N/A;   DIALYSIS/PERMA CATHETER INSERTION N/A 11/16/2018   Procedure: DIALYSIS/PERMA CATHETER INSERTION;  Surgeon: Algernon Huxley, MD;  Location: [place] CV LAB;  Service: Cardiovascular;  Laterality: N/A;   DIALYSIS/PERMA CATHETER REMOVAL N/A 08/16/2019   Procedure: DIALYSIS/PERMA CATHETER REMOVAL;  Surgeon: Algernon Huxley, MD;  Location: [place] CV LAB;  Service: Cardiovascular;  Laterality: N/A;   DILATION AND CURETTAGE OF UTERUS  1985   ESOPHAGOGASTRODUODENOSCOPY (EGD) WITH PROPOFOL N/A 09/07/2016   Procedure: ESOPHAGOGASTRODUODENOSCOPY (EGD) WITH PROPOFOL;  Surgeon: Lucilla Lame, MD;  Location: [facility] ENDOSCOPY;  Service: Endoscopy;  Laterality: N/A;   GALLBLADDER SURGERY  2012   INTRAMEDULLARY (IM) NAIL INTERTROCHANTERIC Right 04/23/2020   Procedure: INTRAMEDULLARY (IM) NAIL INTERTROCHANTRIC;  Surgeon:  Thornton Park, MD;  Location: [facility] ORS;  Service: Orthopedics;  Laterality: Right;   INTRAVASCULAR IMAGING/OCT N/A 06/27/2020   Procedure: INTRAVASCULAR IMAGING/OCT;  Surgeon: Sherren Mocha, MD;  Location: [place] CV LAB;  Service: Cardiovascular;  Laterality: N/A;   IR FLUORO GUIDE CV LINE RIGHT  09/04/2020   IR US GUIDE VASC ACCESS RIGHT  09/04/2020   KNEE SURGERY     Dr. Tamala Julian   LEFT HEART CATH AND CORONARY ANGIOGRAPHY N/A 06/25/2020   Procedure: LEFT HEART CATH AND CORONARY ANGIOGRAPHY;  Surgeon: Troy Sine, MD;  Location: [place] CV LAB;  Service: Cardiovascular;  Laterality: N/A;   TONSILLECTOMY     TONSILLECTOMY AND ADENOIDECTOMY  1970   UPPER GI ENDOSCOPY  03/02/2006   H. Pylori negative; Dr. Allen Norris    Prior to Admission medications   Medication Sig Start Date End Date Taking? Authorizing Provider  acetaminophen (TYLENOL) 325 MG tablet Take 1-2 tablets (325-650 mg total) by mouth every 4 (four) hours as needed for mild pain. 09/12/20   Love, Ivan Anchors, PA-C  aspirin 81 MG chewable tablet Chew 1 tablet (81 mg total)  by mouth daily. 06/30/20   Geradine Girt, DO  atorvastatin (LIPITOR) 40 MG tablet Take 1 tablet (40 mg total) by mouth daily. 09/13/20   Love, Ivan Anchors, PA-C  atorvastatin (LIPITOR) 40 MG tablet Take by mouth. 08/04/20 08/04/21  [provider]  brivaracetam (BRIVIACT) 25 MG TABS tablet Take 3 tablets (75 mg total) by mouth 2 (two) times daily. 09/12/20   Love, Ivan Anchors, PA-C  butalbital-acetaminophen-caffeine (FIORICET) [phone] MG tablet Take 1 tablet by mouth 3 (three) times daily as needed for headache. 09/12/20   Love, Ivan Anchors, PA-C  camphor-menthol [facility]) lotion Apply topically as needed for itching. 09/12/20   Love, Ivan Anchors, PA-C  Chlorhexidine Gluconate Cloth 2 % PADS Apply 6 each topically daily. 09/13/20   Love, Ivan Anchors, PA-C  Darbepoetin Alfa (ARANESP) 60 MCG/0.3ML SOSY injection Inject 0.3 mLs (60 mcg total) into the vein every Saturday with  hemodialysis. 09/20/20   Love, Ivan Anchors, PA-C  docusate sodium (COLACE) 100 MG capsule Take 1 capsule (100 mg total) by mouth 2 (two) times daily. 09/12/20   Love, Ivan Anchors, PA-C  gentamicin cream (GARAMYCIN) 0.1 % Apply 1 application topically daily. 07/24/20   Love, Ivan Anchors, PA-C  lacosamide (VIMPAT) 50 MG TABS tablet Take 1 tablet (50 mg total) by mouth 2 (two) times daily. 09/12/20   Love, Ivan Anchors, PA-C  Melatonin 3 MG TBDP Take by mouth. 08/04/20   [provider]  melatonin 5 MG TABS Take 1 tablet (5 mg total) by mouth at bedtime. 09/12/20   Love, Ivan Anchors, PA-C  melatonin 5 MG TABS Take 1 tablet (5 mg total) by mouth at bedtime as needed. 09/12/20   Love, Ivan Anchors, PA-C  methocarbamol (ROBAXIN) 500 MG tablet Take 1 tablet (500 mg total) by mouth every 6 (six) hours as needed for muscle spasms. 09/12/20   Love, Ivan Anchors, PA-C  metoCLOPramide (REGLAN) 5 MG tablet Take 1 tablet (5 mg total) by mouth daily after breakfast. 09/16/20   Love, Ivan Anchors, PA-C  metoprolol tartrate (LOPRESSOR) 25 mg/10 mL SUSP Take 2.5 mLs (6.25 mg total) by mouth 2 (two) times daily. 09/12/20   Love, Ivan Anchors, PA-C  multivitamin (RENA-VIT) TABS tablet Take 1 tablet by mouth at bedtime. 09/12/20   Love, Ivan Anchors, PA-C  pantoprazole (PROTONIX) 40 MG tablet TAKE 1 TABLET (40 MG TOTAL) BY MOUTH DAILY. 06/30/20 06/30/21  Geradine Girt, DO  prochlorperazine (COMPAZINE) 10 MG tablet Take 1 tablet (10 mg total) by mouth every 8 (eight) hours as needed for nausea or vomiting. 07/24/20   Love, Ivan Anchors, PA-C  traZODone (DESYREL) 50 MG tablet Take 0.5-1 tablets (25-50 mg total) by mouth at bedtime as needed for sleep. 09/12/20   Love, Ivan Anchors, PA-C  triamcinolone cream (KENALOG) 0.1 % Apply topically 3 (three) times daily. 09/12/20   Love, Ivan Anchors, PA-C    Allergies Cephalosporins, Clarithromycin, Ensure [nutritional supplements], Gabapentin, Lunesta  [eszopiclone], Nepro [compleat], Penicillin v, and Sulfa antibiotics  Family  History  Problem Relation Age of Onset   Pancreatic cancer Father    Congestive Heart Failure Maternal Grandmother    Cancer Paternal Grandfather    Heart disease Maternal Uncle     Social History Social History   Tobacco Use   Smoking status: Never   Smokeless tobacco: Never  Vaping Use   Vaping Use: Never used  Substance Use Topics   Alcohol use: Yes    Alcohol/week: 0.0 - 2.0 standard drinks  Drug use: No    Review of Systems  Review of Systems  Constitutional:  Negative for chills and fever.  HENT:  Negative for sore throat.   Eyes:  Negative for pain.  Respiratory:  Negative for cough and stridor.   Cardiovascular:  Negative for chest pain.  Gastrointestinal:  Positive for nausea (yesterday). Negative for vomiting.  Skin:  Negative for rash.  Neurological:  Negative for seizures, loss of consciousness and headaches.  Psychiatric/Behavioral:  Negative for suicidal ideas.   All other systems reviewed and are negative.    ____________________________________________   PHYSICAL EXAM:  VITAL SIGNS: ED Triage Vitals  Enc Vitals Group     BP 11/07/20 1414 (!) 150/85     Pulse Rate 11/07/20 1414 89     Resp 11/07/20 1414 16     Temp 11/07/20 1414 98.9 F (37.2 C)     Temp Source 11/07/20 1414 Oral     SpO2 11/07/20 1414 97 %     Weight --      Height 11/07/20 1410 5\' 4"  (1.626 m)     Head Circumference --      Peak Flow --      Pain Score 11/07/20 1410 0     Pain Loc --      Pain Edu? --      Excl. in [place]? --    Vitals:   11/07/20 1414  BP: (!) 150/85  Pulse: 89  Resp: 16  Temp: 98.9 F (37.2 C)  SpO2: 97%   Physical Exam Vitals and nursing note reviewed.  Constitutional:      General: She is not in acute distress.    Appearance: She is well-developed.  HENT:     Head: Normocephalic and atraumatic.     Right Ear: External ear normal.     Left Ear: External ear normal.     Nose: Nose normal.  Eyes:     Conjunctiva/sclera: Conjunctivae  normal.  Cardiovascular:     Rate and Rhythm: Normal rate and regular rhythm.     Heart sounds: No murmur heard. Pulmonary:     Effort: Pulmonary effort is normal. No respiratory distress.     Breath sounds: Normal breath sounds.  Abdominal:     Palpations: Abdomen is soft.     Tenderness: There is no abdominal tenderness.  Musculoskeletal:     Cervical back: Neck supple.     Right lower leg: Edema present.     Left lower leg: Edema present.  Skin:    General: Skin is warm and dry.     Capillary Refill: Capillary refill takes less than 2 seconds.  Neurological:     Mental Status: She is alert and oriented to person, place, and time.  Psychiatric:        Mood and Affect: Mood normal.    Right Port-A-Cath is in place.  PD catheter still in place of the abdomen without any surrounding skin changes. ____________________________________________   LABS (all labs ordered are listed, but only abnormal results are displayed)  Labs Reviewed  BASIC METABOLIC PANEL - Abnormal; Notable for the following components:      Result Value   Sodium 122 (*)    Chloride 96 (*)    CO2 18 (*)    BUN 34 (*)    Creatinine, Ser 3.20 (*)    Calcium 7.8 (*)    GFR, Estimated 15 (*)    All other components within normal limits  CBC - Abnormal; Notable  for the following components:   RBC 3.70 (*)    Hemoglobin 10.1 (*)    HCT 33.2 (*)    All other components within normal limits  RESP PANEL BY RT-PCR (FLU A&B, COVID) ARPGX2  BRAIN NATRIURETIC PEPTIDE  TROPONIN I (HIGH SENSITIVITY)   ____________________________________________  EKG  Sinus rhythm with ventricular rate of 91, normal axis, somewhat low amplitude throughout without alternans, unremarkable intervals and some nonspecific ST changes in inferior leads without other patient acute ischemia or significant arrhythmia. ____________________________________________  RADIOLOGY  ED MD interpretation: Chest x-ray remarkable for interstitial  edema bilateral pleural effusions without pneumothorax or clear focal consolidation.  Official radiology report(s): DG Chest 2 View  Result Date: 11/07/2020 CLINICAL DATA:  Nausea and vomiting, edema, dialysis port not functioning EXAM: CHEST - 2 VIEW COMPARISON:  July 25, 2020 FINDINGS: Right chest dialysis catheter with tip overlying the superior cavoatrial junction. The heart size and mediastinal contours are similar prior. Moderate right and small left pleural effusion. Mild diffuse interstitial opacities. The visualized skeletal structures are unchanged. IMPRESSION: Interstitial edema with moderate right and small left pleural effusions. Electronically Signed   By: Dahlia Bailiff MD   On: 11/07/2020 15:38    ____________________________________________   PROCEDURES  Procedure(s) performed (including Critical Care):  .1-3 Lead EKG Interpretation  Date/Time: 11/07/2020 3:55 PM Performed by: Lucrezia Starch, MD Authorized by: Lucrezia Starch, MD     Interpretation: normal     ECG rate assessment: normal     Rhythm: sinus rhythm     Ectopy: none     Conduction: normal     ____________________________________________   INITIAL IMPRESSION / ASSESSMENT AND PLAN / ED COURSE      Patient presents with above-stated history exam for assessment of nonfunctional Port-A-Cath [place] to be dialyzed through.  She was last dialyzed on 7/4.  Other than some nausea she has not had any other associated sick symptoms.  On arrival she is afebrile and hemodynamically stable.  She does appear volume overloaded but is not hypoxic and does not consent respiratory distress.  BMP shows evidence of hyponatremia with NA 122 compared to 124 1 month ago.  Bicarb is 18 BUN is 34 with a creatinine of 3.2 without other significant derangements noted.  CBC shows no leukocytosis and hemoglobin at baseline.  Chest x-ray remarkable for interstitial edema bilateral pleural effusions without pneumothorax or  clear focal consolidation.  Discussed patient I am concerned of nonfunctional dialysis catheter with on-call nephrologist Dr. Holley Raring who recommended admission so dialysis RN could troubleshoot patient's Port-A-Cath daily tomorrow patient can be dialyzed then.  I think this is reasonable as patient does not have indication for emergent dialysis at this point and I think it is reasonable for her to be admitted for catheter to be troubleshooting.  Admitted in stable condition.     ____________________________________________   FINAL CLINICAL IMPRESSION(S) / ED DIAGNOSES  Final diagnoses:  ESRD (end stage renal disease) ([place])  Complication associated with dialysis catheter    Medications - No data to display   ED Discharge Orders     None        Note:  This document was prepared using Dragon voice recognition software and may include unintentional dictation errors.    Lucrezia Starch, MD 11/07/20 2106

## 2020-11-07 NOTE — ED Notes (Signed)
Patient transported to X-ray 

## 2020-11-07 NOTE — ED Notes (Signed)
Patient only c/o nausea.  Reports vomiting x 1 yesterday.  Per EMS report, patient's dialysis port not functioning.

## 2020-11-08 DIAGNOSIS — J810 Acute pulmonary edema: Secondary | ICD-10-CM | POA: Diagnosis not present

## 2020-11-08 LAB — BASIC METABOLIC PANEL
Anion gap: 4 — ABNORMAL LOW (ref 5–15)
BUN: 35 mg/dL — ABNORMAL HIGH (ref 8–23)
CO2: 23 mmol/L (ref 22–32)
Calcium: 8 mg/dL — ABNORMAL LOW (ref 8.9–10.3)
Chloride: 98 mmol/L (ref 98–111)
Creatinine, Ser: 3.38 mg/dL — ABNORMAL HIGH (ref 0.44–1.00)
GFR, Estimated: 14 mL/min — ABNORMAL LOW (ref >60)
Glucose, Bld: 76 mg/dL (ref 70–99)
Potassium: 4 mmol/L (ref 3.5–5.1)
Sodium: 125 mmol/L — ABNORMAL LOW (ref 135–145)

## 2020-11-08 LAB — HEPATITIS B SURFACE ANTIGEN: Hepatitis B Surface Ag: NONREACTIVE

## 2020-11-08 MED ORDER — HEPARIN SODIUM (PORCINE) 1000 UNIT/ML IJ SOLN
INTRAMUSCULAR | Status: AC
  Filled 2020-11-08: qty 1

## 2020-11-08 MED ORDER — LEVETIRACETAM 500 MG PO TABS: 500.0000 mg | ORAL_TABLET | Freq: Every day | ORAL | Status: DC

## 2020-11-08 MED ORDER — LEVETIRACETAM 500 MG PO TABS
500.0000 mg | ORAL_TABLET | Freq: Every day | ORAL | Status: DC
  Administered 2020-11-08 – 2020-11-12 (×6): 500 mg via ORAL
  Filled 2020-11-08 (×5): qty 1

## 2020-11-08 NOTE — Consult Note (Signed)
WOC Nurse Consult Note: Reason for Consult:Stage 2 pressure injury to sacrum, POA Wound type:pressure plus moisture Pressure Injury POA: Yes Measurement:TO be obtained by Bedside RN and documented on the Nursing Flow Sheet prior to the placement of the first dressing change today Wound ZVG:JFTN, moist Drainage (amount, consistency, odor) scant serous Periwound: intact Dressing procedure/placement/frequency:Treatment for Stage 2 pressure injuries is guided by protocol and orders are provided consistent with protocol for placement of an antimicrobial (xeroform) nonadherent topped with dry gauze and covered with a silicone foam. Turning and repositioning is advised and time in the supine position minimized. Heels are to be floated.  [facility] nursing team will not follow, but will remain available to this patient, the nursing and medical teams.  Please re-consult if needed. Thanks, Maudie Flakes, MSN, RN, [place], Arther Abbott  Pager# [phone]

## 2020-11-08 NOTE — Progress Notes (Addendum)
PROGRESS NOTE    Amy [name]  PPI:[id number] DOB: 10-Nov-1950 DOA: 11/07/2020 PCP: Murlean Iba, MD  [id number]   Assessment & Plan:   Active Problems:   Pulmonary edema   Amy [name] is a 70 y.o. Caucasian female with medical history significant for end-stage renal disease on hemodialysis, anxiety, anemia, gastritis, GERD, multiple admissions with most recent for C. difficile colitis and non-STEMI status post PCI and recent acute on chronic SDH status post partial craniotomy and evacuation of SDH as well as hypertension, presenting to the emergency room with acute onset of nonfunctioning right chest Port-A-Cath.  She has been having worsening bilateral lower and upper extremity edema.  She has 2 pillow orthopnea and admits to dry cough without wheezing.   Pulm edema, anasarca, fluid overlaod 2/2 Missing dialysis due to inability to access dialysis cath ESRD on HD MWF --Pt missed 2 HD sessions due to PermCath clogging --nephro was able to de-clog the PermCath today Plan: --cont IV lasix 60 mg BID  --iHD per nephrology for fluid removal  Chronic systolic CHF  - Her last EF was 25 to 30% with mild mitral regurgitation and trivial tricuspid regurgitation in February of this year. --unable to determine whether fluid overload was due to acute CHF or simply due to missing dialysis Plan: --cont IV lasix 60 mg BID  --iHD per nephrology for fluid removal --cont Lopressor  Hyponatremia, hypervolemia --treat with volume removal  2.  Dyslipidemia. - We will continue statin therapy.  3.  Seizure disorder. - cont Vimpat and Keppra  4.  GERD. - We will continue PPI therapy.  5.  Coronary artery disease. - We will continue aspirin and statin therapy.   DVT prophylaxis: Heparin SQ Code Status: Full code  Family Communication:  Level of care: Med-Surg Dispo:   The patient is from: SNF Anticipated d/c is to: SNF Anticipated d/c date is: 1-2 days Patient currently is not  medically ready to d/c due to: grossly fluid overloaded, needing tandem dialysis    Subjective and Interval History:  Pt's permcath was able to be de-clogged by nephrology team.  Received dialysis today.  Pt still making urine and complained of having to void a lot with IV lasix.     Objective: Vitals:   11/08/20 1445 11/08/20 1500 11/08/20 1538 11/08/20 1657  BP: 125/72 (!) 131/56 (!) 162/68 (!) 156/94  Pulse: 79 77 82 84  Resp: 15 17 18 17   Temp:   98.3 F (36.8 C) 98 F (36.7 C)  TempSrc:      SpO2:   100% 98%  Weight:      Height:        Intake/Output Summary (Last 24 hours) at 11/08/2020 1936 Last data filed at 11/08/2020 1700 Gross per 24 hour  Intake 100 ml  Output 3083 ml  Net -2983 ml   Filed Weights   11/07/20 2211 11/08/20 0355 11/08/20 0611  Weight: 60.1 kg 59.8 kg 62 kg    Examination:   Constitutional: NAD, AAOx3, anasarca HEENT: conjunctivae and lids normal, EOMI CV: No cyanosis.   RESP: normal respiratory effort, on RA Extremities: pitting edema in BLE and BUE. SKIN: warm, dry, extensive bruising  Neuro: II - XII grossly intact.   Psych: Normal mood and affect.  Appropriate judgement and reason   Data Reviewed: I have personally reviewed following labs and imaging studies  CBC: Recent Labs  Lab 11/07/20 1415  WBC 10.4  HGB 10.1*  HCT 33.2*  MCV 89.7  PLT 400   Basic Metabolic Panel: Recent Labs  Lab 11/07/20 1415 11/08/20 0437  NA 122* 125*  K 4.3 4.0  CL 96* 98  CO2 18* 23  GLUCOSE 98 76  BUN 34* 35*  CREATININE 3.20* 3.38*  CALCIUM 7.8* 8.0*   GFR: Estimated Creatinine Clearance: 13.2 mL/min (A) (by C-G formula based on SCr of 3.38 mg/dL (H)). Liver Function Tests: No results for input(s): AST, ALT, ALKPHOS, BILITOT, PROT, ALBUMIN in the last 168 hours. No results for input(s): LIPASE, AMYLASE in the last 168 hours. No results for input(s): AMMONIA in the last 168 hours. Coagulation Profile: No results for input(s): INR,  PROTIME in the last 168 hours. Cardiac Enzymes: No results for input(s): CKTOTAL, CKMB, CKMBINDEX, TROPONINI in the last 168 hours. BNP (last 3 results) No results for input(s): PROBNP in the last 8760 hours. HbA1C: No results for input(s): HGBA1C in the last 72 hours. CBG: No results for input(s): GLUCAP in the last 168 hours. Lipid Profile: No results for input(s): CHOL, HDL, LDLCALC, TRIG, CHOLHDL, LDLDIRECT in the last 72 hours. Thyroid Function Tests: No results for input(s): TSH, T4TOTAL, FREET4, T3FREE, THYROIDAB in the last 72 hours. Anemia Panel: No results for input(s): VITAMINB12, FOLATE, FERRITIN, TIBC, IRON, RETICCTPCT in the last 72 hours. Sepsis Labs: No results for input(s): PROCALCITON, LATICACIDVEN in the last 168 hours.  Recent Results (from the past 240 hour(s))  Resp Panel by RT-PCR (Flu A&B, Covid) Nasopharyngeal Swab     Status: None   Collection Time: 11/07/20  4:00 PM   Specimen: Nasopharyngeal Swab; Nasopharyngeal(NP) swabs in vial transport medium  Result Value Ref Range Status   SARS Coronavirus 2 by RT PCR NEGATIVE NEGATIVE Final    Comment: (NOTE) SARS-CoV-2 target nucleic acids are NOT DETECTED.  The SARS-CoV-2 RNA is generally detectable in upper respiratory specimens during the acute phase of infection. The lowest concentration of SARS-CoV-2 viral copies this assay can detect is 138 copies/mL. A negative result does not preclude SARS-Cov-2 infection and should not be used as the sole basis for treatment or other patient management decisions. A negative result may occur with  improper specimen collection/handling, submission of specimen other than nasopharyngeal swab, presence of viral mutation(s) within the areas targeted by this assay, and inadequate number of viral copies(<138 copies/mL). A negative result must be combined with clinical observations, patient history, and epidemiological information. The expected result is Negative.  Fact  Sheet for Patients:  EntrepreneurPulse.com.au  Fact Sheet for Healthcare Providers:  IncredibleEmployment.be  This test is no t yet approved or cleared by the [place] FDA and  has been authorized for detection and/or diagnosis of SARS-CoV-2 by FDA under an Emergency Use Authorization (EUA). This EUA will remain  in effect (meaning this test can be used) for the duration of the COVID-19 declaration under Section 564(b)(1) of the Act, 21 U.S.C.section 360bbb-3(b)(1), unless the authorization is terminated  or revoked sooner.       Influenza A by PCR NEGATIVE NEGATIVE Final   Influenza B by PCR NEGATIVE NEGATIVE Final    Comment: (NOTE) The Xpert Xpress SARS-CoV-2/FLU/RSV plus assay is intended as an aid in the diagnosis of influenza from Nasopharyngeal swab specimens and should not be used as a sole basis for treatment. Nasal washings and aspirates are unacceptable for Xpert Xpress SARS-CoV-2/FLU/RSV testing.  Fact Sheet for Patients: EntrepreneurPulse.com.au  Fact Sheet for Healthcare Providers: IncredibleEmployment.be  This test is not yet approved or cleared by the [place] and  has been authorized for detection and/or diagnosis of SARS-CoV-2 by FDA under an Emergency Use Authorization (EUA). This EUA will remain in effect (meaning this test can be used) for the duration of the COVID-19 declaration under Section 564(b)(1) of the Act, 21 U.S.C. section 360bbb-3(b)(1), unless the authorization is terminated or revoked.  Performed at [facility], [address], [place], [place] [address]       Radiology Studies: DG Chest 2 View  Result Date: 11/07/2020 CLINICAL DATA:  Nausea and vomiting, edema, dialysis port not functioning EXAM: CHEST - 2 VIEW COMPARISON:  July 25, 2020 FINDINGS: Right chest dialysis catheter with tip overlying the superior cavoatrial junction. The heart  size and mediastinal contours are similar prior. Moderate right and small left pleural effusion. Mild diffuse interstitial opacities. The visualized skeletal structures are unchanged. IMPRESSION: Interstitial edema with moderate right and small left pleural effusions. Electronically Signed   By: Dahlia Bailiff MD   On: 11/07/2020 15:38     Scheduled Meds:  aspirin  81 mg Oral Daily   atorvastatin  40 mg Oral Daily   Chlorhexidine Gluconate Cloth  6 each Topical Daily   Darbepoetin Alfa  60 mcg Intravenous Q Sat-HD   docusate sodium  100 mg Oral BID   furosemide  60 mg Intravenous Q12H   heparin  5,000 Units Subcutaneous Q8H   heparin sodium (porcine)       lacosamide  50 mg Oral BID   levETIRAcetam  500 mg Oral QHS   melatonin  5 mg Oral QHS   metoCLOPramide  5 mg Oral QPC breakfast   metoprolol tartrate  6.25 mg Oral BID   multivitamin  1 tablet Oral QHS   pantoprazole  40 mg Oral Daily   Continuous Infusions:   LOS: 1 day     Enzo Bi, MD [facility] If 7PM-7AM, please contact night-coverage 11/08/2020, 7:36 PM

## 2020-11-08 NOTE — Progress Notes (Signed)
HD initiated a 1130 via Right CVC.  CVC patent + blood return noted.  UF goal of 2.5L met, Dr. Rolly Salter aware. HD completed at 1437, patient tolerated without difficulty, no c/o offered

## 2020-11-08 NOTE — Progress Notes (Signed)
[facility]  ROUNDING NOTE   Subjective:   [name] was admitted to [facility] on 11/07/2020 for Pulmonary edema [J81.1] ESRD (end stage renal disease) ([place]) [I14.4] Complication associated with dialysis catheter [T82.9XXA]  Last hemodialysis treatment was Monday 7/4. Then her catheter stopped functioning. She is at [facility] and was supposed to have her catheter exchanged however this did not happen. Patient was packed with TPA.   Patient presents to ED with fluid overload. 10 kg above her estimated dry weight. Patient placed on dialysis this morning. Lines were reversed and catheter worked well.   Patient's husband was at bedside.    Objective:  Vital signs in last 24 hours:  Temp:  [97.4 F (36.3 C)-98.4 F (36.9 C)] 98.2 F (36.8 C) (07/09 1145) Pulse Rate:  [27-85] 27 (07/09 1415) Resp:  [0-22] 11 (07/09 1415) BP: (64-162)/(43-88) 119/54 (07/09 1415) SpO2:  [97 %-100 %] 98 % (07/09 1130) Weight:  [59.8 kg-62 kg] 62 kg (07/09 0611)  Weight change:  Filed Weights   11/07/20 2211 11/08/20 0355 11/08/20 0611  Weight: 60.1 kg 59.8 kg 62 kg    Intake/Output: I/O last 3 completed shifts: In: 100 [P.O.:100] Out: 350 [Urine:350]   Intake/Output this shift:  Total I/O In: -  Out: 150 [Urine:150]  Physical Exam: General: NAD,   Head: Normocephalic, atraumatic. Moist oral mucosal membranes  Eyes: Anicteric, PERRL  Neck: Supple, trachea midline  Lungs:  Clear to auscultation  Heart: Regular rate and rhythm  Abdomen:  Soft, nontender,   Extremities:  ++ peripheral edema.  Neurologic: Nonfocal, moving all four extremities  Skin: No lesions  Access: RIJ permcath, PD catheter    Basic Metabolic Panel: Recent Labs  Lab 11/07/20 1415 11/08/20 0437  NA 122* 125*  K 4.3 4.0  CL 96* 98  CO2 18* 23  GLUCOSE 98 76  BUN 34* 35*  CREATININE 3.20* 3.38*  CALCIUM 7.8* 8.0*    Liver Function Tests: No results for input(s): AST, ALT, ALKPHOS,  BILITOT, PROT, ALBUMIN in the last 168 hours. No results for input(s): LIPASE, AMYLASE in the last 168 hours. No results for input(s): AMMONIA in the last 168 hours.  CBC: Recent Labs  Lab 11/07/20 1415  WBC 10.4  HGB 10.1*  HCT 33.2*  MCV 89.7  PLT 350    Cardiac Enzymes: No results for input(s): CKTOTAL, CKMB, CKMBINDEX, TROPONINI in the last 168 hours.  BNP: Invalid input(s): POCBNP  CBG: No results for input(s): GLUCAP in the last 168 hours.  Microbiology: Results for orders placed or performed during the hospital encounter of 11/07/20  Resp Panel by RT-PCR (Flu A&B, Covid) Nasopharyngeal Swab     Status: None   Collection Time: 11/07/20  4:00 PM   Specimen: Nasopharyngeal Swab; Nasopharyngeal(NP) swabs in vial transport medium  Result Value Ref Range Status   SARS Coronavirus 2 by RT PCR NEGATIVE NEGATIVE Final    Comment: (NOTE) SARS-CoV-2 target nucleic acids are NOT DETECTED.  The SARS-CoV-2 RNA is generally detectable in upper respiratory specimens during the acute phase of infection. The lowest concentration of SARS-CoV-2 viral copies this assay can detect is 138 copies/mL. A negative result does not preclude SARS-Cov-2 infection and should not be used as the sole basis for treatment or other patient management decisions. A negative result may occur with  improper specimen collection/handling, submission of specimen other than nasopharyngeal swab, presence of viral mutation(s) within the areas targeted by this assay, and inadequate number of  viral copies(<138 copies/mL). A negative result must be combined with clinical observations, patient history, and epidemiological information. The expected result is Negative.  Fact Sheet for Patients:  EntrepreneurPulse.com.au  Fact Sheet for Healthcare Providers:  IncredibleEmployment.be  This test is no t yet approved or cleared by the [place] FDA and  has been authorized  for detection and/or diagnosis of SARS-CoV-2 by FDA under an Emergency Use Authorization (EUA). This EUA will remain  in effect (meaning this test can be used) for the duration of the COVID-19 declaration under Section 564(b)(1) of the Act, 21 U.S.C.section 360bbb-3(b)(1), unless the authorization is terminated  or revoked sooner.       Influenza A by PCR NEGATIVE NEGATIVE Final   Influenza B by PCR NEGATIVE NEGATIVE Final    Comment: (NOTE) The Xpert Xpress SARS-CoV-2/FLU/RSV plus assay is intended as an aid in the diagnosis of influenza from Nasopharyngeal swab specimens and should not be used as a sole basis for treatment. Nasal washings and aspirates are unacceptable for Xpert Xpress SARS-CoV-2/FLU/RSV testing.  Fact Sheet for Patients: EntrepreneurPulse.com.au  Fact Sheet for Healthcare Providers: IncredibleEmployment.be  This test is not yet approved or cleared by the [place] FDA and has been authorized for detection and/or diagnosis of SARS-CoV-2 by FDA under an Emergency Use Authorization (EUA). This EUA will remain in effect (meaning this test can be used) for the duration of the COVID-19 declaration under Section 564(b)(1) of the Act, 21 U.S.C. section 360bbb-3(b)(1), unless the authorization is terminated or revoked.  Performed at [facility], [address] [place], [place] [address]     Coagulation Studies: No results for input(s): LABPROT, INR in the last 72 hours.  Urinalysis: No results for input(s): COLORURINE, LABSPEC, PHURINE, GLUCOSEU, HGBUR, BILIRUBINUR, KETONESUR, PROTEINUR, UROBILINOGEN, NITRITE, LEUKOCYTESUR in the last 72 hours.  Invalid input(s): APPERANCEUR    Imaging: DG Chest 2 View  Result Date: 11/07/2020 CLINICAL DATA:  Nausea and vomiting, edema, dialysis port not functioning EXAM: CHEST - 2 VIEW COMPARISON:  July 25, 2020 FINDINGS: Right chest dialysis catheter with tip overlying  the superior cavoatrial junction. The heart size and mediastinal contours are similar prior. Moderate right and small left pleural effusion. Mild diffuse interstitial opacities. The visualized skeletal structures are unchanged. IMPRESSION: Interstitial edema with moderate right and small left pleural effusions. Electronically Signed   By: Dahlia Bailiff MD   On: 11/07/2020 15:38     Medications:     aspirin  81 mg Oral Daily   atorvastatin  40 mg Oral Daily   Chlorhexidine Gluconate Cloth  6 each Topical Daily   Darbepoetin Alfa  60 mcg Intravenous Q Sat-HD   docusate sodium  100 mg Oral BID   furosemide  60 mg Intravenous Q12H   heparin  5,000 Units Subcutaneous Q8H   heparin sodium (porcine)       lacosamide  50 mg Oral BID   levETIRAcetam  500 mg Oral QHS   melatonin  5 mg Oral QHS   metoCLOPramide  5 mg Oral QPC breakfast   metoprolol tartrate  6.25 mg Oral BID   multivitamin  1 tablet Oral QHS   pantoprazole  40 mg Oral Daily   acetaminophen **OR** acetaminophen, butalbital-acetaminophen-caffeine, camphor-menthol, magnesium hydroxide, methocarbamol, ondansetron **OR** ondansetron (ZOFRAN) IV, prochlorperazine, traZODone, traZODone  Assessment/ Plan:  [name] is a 70 y.o. white female with end stage renal disease on hemodialysis, hypertension, hyperlipidemia, seizure disorder, GERD, anxiety who is admitted to [facility] on 11/07/2020  for Pulmonary edema [J81.1] ESRD (end stage renal disease) ([place]) [O67.1] Complication associated with dialysis catheter [T82.9XXA]  [facility] PD - on back up hemodialysis Hemodialysis: MWF [facility] RIJ permcath 53 kg  End stage renal disease: on back up hemodialysis. Currently over 10 kg above her target weight. Seen and examined on hemodialysis treatment.   - May need an extra treatment tomorrow due to fluid overload  - Complication of dialysis device: reversed the lines  - flush peritoneal dialysis catheter  Hypertension: on metoprolol and  furosemide.   Anemia of chronic kidney disease: hemoglobin 10.1. Aranesp given on dialysis treatment.   Secondary Hyperparathyroidism: currently not on binders.    LOS: 1 Laylee Schooley 7/9/20222:37 PM

## 2020-11-08 NOTE — Progress Notes (Signed)
Report given to 2A RN on patient. Tele monitor was discontinued off patient by the tech and central tele was notified. Patient will be moved by nightshift.

## 2020-11-08 NOTE — Plan of Care (Signed)
  Problem: Education: Goal: Knowledge of General Education information will improve Description: Including pain rating scale, medication(s)/side effects and non-pharmacologic comfort measures Outcome: Progressing   Problem: Health Behavior/Discharge Planning: Goal: Ability to manage health-related needs will improve Outcome: Progressing   Problem: Clinical Measurements: Goal: Ability to maintain clinical measurements within normal limits will improve Outcome: Progressing Goal: Will remain free from infection Outcome: Progressing Goal: Diagnostic test results will improve Outcome: Progressing Goal: Respiratory complications will improve Outcome: Progressing Goal: Cardiovascular complication will be avoided Outcome: Progressing   Problem: Activity: Goal: Risk for activity intolerance will decrease Outcome: Progressing   Problem: Nutrition: Goal: Adequate nutrition will be maintained Outcome: Progressing   Problem: Coping: Goal: Level of anxiety will decrease Outcome: Progressing   Problem: Elimination: Goal: Will not experience complications related to bowel motility Outcome: Progressing Goal: Will not experience complications related to urinary retention Outcome: Progressing   Problem: Elimination: Goal: Will not experience complications related to bowel motility Outcome: Progressing Goal: Will not experience complications related to urinary retention Outcome: Progressing   Problem: Pain Managment: Goal: General experience of comfort will improve Outcome: Progressing   Problem: Safety: Goal: Ability to remain free from injury will improve Outcome: Progressing   Problem: Skin Integrity: Goal: Risk for impaired skin integrity will decrease Outcome: Progressing   Problem: Education: Goal: Ability to demonstrate management of disease process will improve Outcome: Progressing Goal: Ability to verbalize understanding of medication therapies will improve Outcome:  Progressing   Problem: Activity: Goal: Capacity to carry out activities will improve Outcome: Progressing   Problem: Cardiac: Goal: Ability to achieve and maintain adequate cardiopulmonary perfusion will improve Outcome: Progressing

## 2020-11-09 DIAGNOSIS — J810 Acute pulmonary edema: Secondary | ICD-10-CM | POA: Diagnosis not present

## 2020-11-09 LAB — CBC
HCT: 25 % — ABNORMAL LOW (ref 36.0–46.0)
Hemoglobin: 8.1 g/dL — ABNORMAL LOW (ref 12.0–15.0)
MCH: 27.5 pg (ref 26.0–34.0)
MCHC: 32.4 g/dL (ref 30.0–36.0)
MCV: 84.7 fL (ref 80.0–100.0)
Platelets: 281 10*3/uL (ref 150–400)
RBC: 2.95 MIL/uL — ABNORMAL LOW (ref 3.87–5.11)
RDW: 15.2 % (ref 11.5–15.5)
WBC: 6.3 10*3/uL (ref 4.0–10.5)
nRBC: 0 % (ref 0.0–0.2)

## 2020-11-09 LAB — MAGNESIUM: Magnesium: 1.5 mg/dL — ABNORMAL LOW (ref 1.7–2.4)

## 2020-11-09 LAB — BASIC METABOLIC PANEL
Anion gap: 3 — ABNORMAL LOW (ref 5–15)
BUN: 23 mg/dL (ref 8–23)
CO2: 28 mmol/L (ref 22–32)
Calcium: 7.5 mg/dL — ABNORMAL LOW (ref 8.9–10.3)
Chloride: 98 mmol/L (ref 98–111)
Creatinine, Ser: 2.39 mg/dL — ABNORMAL HIGH (ref 0.44–1.00)
GFR, Estimated: 21 mL/min — ABNORMAL LOW (ref >60)
Glucose, Bld: 94 mg/dL (ref 70–99)
Potassium: 3.4 mmol/L — ABNORMAL LOW (ref 3.5–5.1)
Sodium: 129 mmol/L — ABNORMAL LOW (ref 135–145)

## 2020-11-09 MED ORDER — MAGNESIUM SULFATE 2 GM/50ML IV SOLN
2.0000 g | Freq: Once | INTRAVENOUS | Status: AC
  Administered 2020-11-09: 2 g via INTRAVENOUS
  Filled 2020-11-09: qty 50

## 2020-11-09 NOTE — Progress Notes (Signed)
Pt HD end at 1559. Full treatment of 2.5 hr complete and 2.5L UF goal met. Tx was sequential only. Pt catheter was reversed for tx. Flushed and 3102m NS rinseback post HD with no complications. Pt catheter capped. HD dressing change due 11/15/20. PD catheter dressing change. Unable to complete PD flush d/t PD catheter resistance. [place], MD aware. Pt alert, no c/o, report to primary RN and ccmd notified.

## 2020-11-09 NOTE — Progress Notes (Signed)
PROGRESS NOTE    Amy [name]  MVH:[id number] DOB: [birth year] DOA: 11/07/2020 PCP: Murlean Iba, MD  [id number]   Assessment & Plan:   Active Problems:   Pulmonary edema   Amy [name] is a 70 y.o. Caucasian female with medical history significant for end-stage renal disease on hemodialysis, anxiety, anemia, gastritis, GERD, multiple admissions with most recent for C. difficile colitis and non-STEMI status post PCI and recent acute on chronic SDH status post partial craniotomy and evacuation of SDH as well as hypertension, presenting to the emergency room with acute onset of nonfunctioning right chest Port-A-Cath.  She has been having worsening bilateral lower and upper extremity edema.  She has 2 pillow orthopnea and admits to dry cough without wheezing.   Pulm edema, anasarca, fluid overlaod 2/2 Missing dialysis due to inability to access dialysis cath ESRD on HD MWF --Amy [name] missed 2 HD sessions due to PermCath clogging --nephro was able to de-clog the PermCath  Plan: --cont IV lasix 60 mg BID  --iHD today --iHD per nephrology for fluid removal  Chronic systolic CHF  - Her last EF was 25 to 30% with mild mitral regurgitation and trivial tricuspid regurgitation in February of this year. --unable to determine whether fluid overload was due to acute CHF or simply due to missing dialysis Plan: --cont IV lasix 60 mg BID  --iHD today --iHD per nephrology for fluid removal --cont Lopressor  Hyponatremia, hypervolemia --treat with volume removal  2.  Dyslipidemia. - We will continue statin therapy.  3.  Seizure disorder. - cont Vimpat and Keppra  4.  GERD. - We will continue PPI therapy.  5.  Coronary artery disease. - We will continue aspirin and statin therapy.  Chronic headache --chronic and long-standing --Fioricet PRN  Hypomag --replete with IV mag  Hypokalemia --will defer to nephrology for repletion  Anemia of chronic kidney disease: hemoglobin 8.1 this  morning --Epo with dialysis    DVT prophylaxis: Heparin SQ Code Status: Full code  Family Communication:  Level of care: Med-Surg Dispo:   The patient is from: SNF Anticipated d/c is to: SNF Anticipated d/c date is: 1-2 days Patient currently is not medically ready to d/c due to: grossly fluid overloaded, needing tandem dialysis    Subjective and Interval History:  Amy [name] reported feeling better and swelling improved.  Did complain of a headache which is a chronic problem.   Receiving dialysis again today.    Objective: Vitals:   11/09/20 1327 11/09/20 1330 11/09/20 1345 11/09/20 1400  BP: (!) 145/69 (!) 145/68 (!) 144/65 (!) 150/78  Pulse: 69 70 67 73  Resp: 18 12 13 15   Temp:      TempSrc:      SpO2: 96% 95% 100% 100%  Weight:      Height:        Intake/Output Summary (Last 24 hours) at 11/09/2020 1428 Last data filed at 11/09/2020 0555 Gross per 24 hour  Intake 480 ml  Output 2583 ml  Net -2103 ml   Filed Weights   11/08/20 0611 11/09/20 0500 11/09/20 1000  Weight: 62 kg 61.2 kg 57.4 kg    Examination:   Constitutional: NAD, AAOx3, receiving dialysis HEENT: conjunctivae and lids normal, EOMI CV: No cyanosis.   RESP: normal respiratory effort, on RA Extremities: 2-3+ pitting edema in BLE.  Swelling in both arms improved. SKIN: warm, dry, extensive bruising  Neuro: II - XII grossly intact.   Psych: Normal mood and affect.  Appropriate judgement and reason  Data Reviewed: I have personally reviewed following labs and imaging studies  CBC: Recent Labs  Lab 11/07/20 1415 11/09/20 0421  WBC 10.4 6.3  HGB 10.1* 8.1*  HCT 33.2* 25.0*  MCV 89.7 84.7  PLT 350 867   Basic Metabolic Panel: Recent Labs  Lab 11/07/20 1415 11/08/20 0437 11/09/20 0421  NA 122* 125* 129*  K 4.3 4.0 3.4*  CL 96* 98 98  CO2 18* 23 28  GLUCOSE 98 76 94  BUN 34* 35* 23  CREATININE 3.20* 3.38* 2.39*  CALCIUM 7.8* 8.0* 7.5*  MG  --   --  1.5*   GFR: Estimated Creatinine  Clearance: 18.6 mL/min (A) (by C-G formula based on SCr of 2.39 mg/dL (H)). Liver Function Tests: No results for input(s): AST, ALT, ALKPHOS, BILITOT, PROT, ALBUMIN in the last 168 hours. No results for input(s): LIPASE, AMYLASE in the last 168 hours. No results for input(s): AMMONIA in the last 168 hours. Coagulation Profile: No results for input(s): INR, PROTIME in the last 168 hours. Cardiac Enzymes: No results for input(s): CKTOTAL, CKMB, CKMBINDEX, TROPONINI in the last 168 hours. BNP (last 3 results) No results for input(s): PROBNP in the last 8760 hours. HbA1C: No results for input(s): HGBA1C in the last 72 hours. CBG: No results for input(s): GLUCAP in the last 168 hours. Lipid Profile: No results for input(s): CHOL, HDL, LDLCALC, TRIG, CHOLHDL, LDLDIRECT in the last 72 hours. Thyroid Function Tests: No results for input(s): TSH, T4TOTAL, FREET4, T3FREE, THYROIDAB in the last 72 hours. Anemia Panel: No results for input(s): VITAMINB12, FOLATE, FERRITIN, TIBC, IRON, RETICCTPCT in the last 72 hours. Sepsis Labs: No results for input(s): PROCALCITON, LATICACIDVEN in the last 168 hours.  Recent Results (from the past 240 hour(s))  Resp Panel by RT-PCR (Flu A&B, Covid) Nasopharyngeal Swab     Status: None   Collection Time: 11/07/20  4:00 PM   Specimen: Nasopharyngeal Swab; Nasopharyngeal(NP) swabs in vial transport medium  Result Value Ref Range Status   SARS Coronavirus 2 by RT PCR NEGATIVE NEGATIVE Final    Comment: (NOTE) SARS-CoV-2 target nucleic acids are NOT DETECTED.  The SARS-CoV-2 RNA is generally detectable in upper respiratory specimens during the acute phase of infection. The lowest concentration of SARS-CoV-2 viral copies this assay can detect is 138 copies/mL. A negative result does not preclude SARS-Cov-2 infection and should not be used as the sole basis for treatment or other patient management decisions. A negative result may occur with  improper specimen  collection/handling, submission of specimen other than nasopharyngeal swab, presence of viral mutation(s) within the areas targeted by this assay, and inadequate number of viral copies(<138 copies/mL). A negative result must be combined with clinical observations, patient history, and epidemiological information. The expected result is Negative.  Fact Sheet for Patients:  EntrepreneurPulse.com.au  Fact Sheet for Healthcare Providers:  IncredibleEmployment.be  This test is no t yet approved or cleared by the [place] FDA and  has been authorized for detection and/or diagnosis of SARS-CoV-2 by FDA under an Emergency Use Authorization (EUA). This EUA will remain  in effect (meaning this test can be used) for the duration of the COVID-19 declaration under Section 564(b)(1) of the Act, 21 U.S.C.section 360bbb-3(b)(1), unless the authorization is terminated  or revoked sooner.       Influenza A by PCR NEGATIVE NEGATIVE Final   Influenza B by PCR NEGATIVE NEGATIVE Final    Comment: (NOTE) The Xpert Xpress SARS-CoV-2/FLU/RSV plus assay is intended as an aid in  the diagnosis of influenza from Nasopharyngeal swab specimens and should not be used as a sole basis for treatment. Nasal washings and aspirates are unacceptable for Xpert Xpress SARS-CoV-2/FLU/RSV testing.  Fact Sheet for Patients: EntrepreneurPulse.com.au  Fact Sheet for Healthcare Providers: IncredibleEmployment.be  This test is not yet approved or cleared by the [place] FDA and has been authorized for detection and/or diagnosis of SARS-CoV-2 by FDA under an Emergency Use Authorization (EUA). This EUA will remain in effect (meaning this test can be used) for the duration of the COVID-19 declaration under Section 564(b)(1) of the Act, 21 U.S.C. section 360bbb-3(b)(1), unless the authorization is terminated or revoked.  Performed at [facility], [address], [place], [place] [address]       Radiology Studies: DG Chest 2 View  Result Date: 11/07/2020 CLINICAL DATA:  Nausea and vomiting, edema, dialysis port not functioning EXAM: CHEST - 2 VIEW COMPARISON:  July 25, 2020 FINDINGS: Right chest dialysis catheter with tip overlying the superior cavoatrial junction. The heart size and mediastinal contours are similar prior. Moderate right and small left pleural effusion. Mild diffuse interstitial opacities. The visualized skeletal structures are unchanged. IMPRESSION: Interstitial edema with moderate right and small left pleural effusions. Electronically Signed   By: Dahlia Bailiff MD   On: 11/07/2020 15:38     Scheduled Meds:  aspirin  81 mg Oral Daily   atorvastatin  40 mg Oral Daily   Chlorhexidine Gluconate Cloth  6 each Topical Daily   Darbepoetin Alfa  60 mcg Intravenous Q Sat-HD   docusate sodium  100 mg Oral BID   furosemide  60 mg Intravenous Q12H   heparin  5,000 Units Subcutaneous Q8H   lacosamide  50 mg Oral BID   levETIRAcetam  500 mg Oral QHS   melatonin  5 mg Oral QHS   metoCLOPramide  5 mg Oral QPC breakfast   metoprolol tartrate  6.25 mg Oral BID   multivitamin  1 tablet Oral QHS   pantoprazole  40 mg Oral Daily   Continuous Infusions:  magnesium sulfate bolus IVPB       LOS: 2 days     Enzo Bi, MD [facility] If 7PM-7AM, please contact night-coverage 11/09/2020, 2:28 PM

## 2020-11-09 NOTE — NC FL2 (Signed)
[place] LEVEL OF CARE SCREENING TOOL     IDENTIFICATION  Patient Name: Amy [name] Birthdate: Sep 12, 1950 Sex: female Admission Date (Current Location): 11/07/2020  [facility] and [place] Number:  Engineering geologist and Address:  [facility], [address] [place], [place] [address]      Provider Number: [id number]  Attending Physician Name and Address:  Enzo Bi, MD  Relative Name and Phone Number:       Current Level of Care: Hospital Recommended Level of Care: [place] Prior Approval Number: Kiffany, [place] (Spouse)   [phone] (Mobile)  Date Approved/Denied:   PASRR Number: [id number]  Discharge Plan:      Current Diagnoses: Patient Active Problem List   Diagnosis Date Noted   Pulmonary edema 11/07/2020   CAD (coronary artery disease) of artery bypass graft 09/15/2020   History of nausea and vomiting 09/15/2020   Sleep disturbance    SDH (subdural hematoma) ([place]) 08/07/2020   Acute on chronic anemia    Subdural hemorrhage following injury ([place]) 08/04/2020   Orthostasis    Gastroparesis    Chronic combined systolic and diastolic congestive heart failure (HCC)    Prolonged Q-T interval on ECG    Nausea    Hypoalbuminemia due to protein-calorie malnutrition (HCC)    Nausea and vomiting    Chronic combined systolic and diastolic CHF (congestive heart failure) (HCC)    Anemia of chronic disease    Seizures ([place])    Chronic pain syndrome    Debility 07/04/2020   Ischemic cardiomyopathy    Protein-calorie malnutrition, severe 06/23/2020   Non-ST elevation (NSTEMI) myocardial infarction ([place])    Pressure injury of skin 06/21/2020   C. difficile diarrhea 06/19/2020   C. difficile colitis 06/19/2020   Seizure ([place]) 06/19/2020   Gastroenteritis due to COVID-19 virus 05/29/2020   Surgery, elective    S/P ORIF (open reduction internal fixation) fracture    Closed right hip fracture, initial encounter  ([place]) 04/22/2020   Hypotension 12/16/2019   ESRD on dialysis ([place]) 12/16/2019   Depression with anxiety 12/16/2019   Hypomagnesemia 12/16/2019   Atrial flutter (HCC)    Atrial fibrillation with RVR ([place]) 04/03/2019   Benign essential HTN 04/03/2019   HLD (hyperlipidemia) 04/03/2019   End stage renal disease ([place]) 12/13/2018   Itching 11/08/2018   Hyperparathyroidism, secondary renal ([place]) 09/13/2017   Ground glass opacity present on imaging of lung 04/08/2017   Osteoarthritis of knee 12/29/2016   Normocytic anemia 10/14/2016   Swelling of limb    Chronic headaches    Gastritis    History of adenomatous polyp of colon 09/07/2016   Chronic abdominal pain 09/05/2016   Iron deficiency anemia 09/05/2016   Hyponatremia 09/05/2016   Multiple thyroid nodules 07/04/2015   Right thyroid nodule 06/27/2015   Allergic rhinitis, seasonal 12/03/2014   Fever blister 12/03/2014   Herniated lumbar intervertebral disc 12/03/2014   Hypokalemia 12/03/2014   Recurrent sinus infections 12/03/2014   Insomnia 11/05/2014   Acquired scoliosis 06/26/2014   Intervertebral disc stenosis of neural canal of lumbar region 06/20/2014   Lumbar facet arthropathy 05/21/2014   Degenerative spondylolisthesis 03/12/2014   OP (osteoporosis) 08/10/2013   Idiopathic peripheral neuropathy 03/24/2009   Edema 12/27/2007   Headache, tension-type 10/25/2006   Essential hypertension 10/24/2006   Anxiety 05/03/1998   Esophageal reflux 05/03/1998    Orientation RESPIRATION BLADDER Height & Weight     Self, Situation, Time, Place  Normal Continent Weight: 126 lb 8.7 oz (  57.4 kg) Height:  5\' 4"  (162.6 cm)  BEHAVIORAL SYMPTOMS/MOOD NEUROLOGICAL BOWEL NUTRITION STATUS      Continent Diet (1200 ml fluid restriction)  AMBULATORY STATUS COMMUNICATION OF NEEDS Skin   Limited Assist Verbally  (stage 2 sacrum)                       Personal Care Assistance Level of Assistance  Bathing, Feeding, Dressing Bathing  Assistance: Limited assistance Feeding assistance: Limited assistance Dressing Assistance: Limited assistance     Functional Limitations Info             SPECIAL CARE FACTORS FREQUENCY  OT (By licensed OT), PT (By licensed PT)     PT Frequency: 5 x/week OT Frequency: 5 x/week            Contractures      Additional Factors Info  Code Status, Allergies Code Status Info: full Allergies Info: Cephalosporins, Clarithromycin, Ensure (Nutritional Supplements), Gabapentin, Lunesta  (Eszopiclone), Nepro (Compleat), Penicillin V, Sulfa Antibiotics           Current Medications (11/09/2020):  This is the current hospital active medication list Current Facility-Administered Medications  Medication Dose Route Frequency Provider Last Rate Last Admin   acetaminophen (TYLENOL) tablet 650 mg  650 mg Oral Q6H PRN Mansy, Jan A, MD   325 mg at 11/08/20 2150   Or   acetaminophen (TYLENOL) suppository 650 mg  650 mg Rectal Q6H PRN Mansy, Jan A, MD       aspirin chewable tablet 81 mg  81 mg Oral Daily Mansy, Jan A, MD   81 mg at 11/09/20 0854   atorvastatin (LIPITOR) tablet 40 mg  40 mg Oral Daily Mansy, Jan A, MD   40 mg at 11/09/20 0854   butalbital-acetaminophen-caffeine (FIORICET) 50-325-40 MG per tablet 1 tablet  1 tablet Oral TID PRN Mansy, Jan A, MD   1 tablet at 11/08/20 2153   camphor-menthol (SARNA) lotion   Topical PRN Mansy, Arvella Merles, MD       Chlorhexidine Gluconate Cloth 2 % PADS 6 each  6 each Topical Daily Mansy, Arvella Merles, MD   6 each at 11/09/20 0958   Darbepoetin Alfa (ARANESP) injection 60 mcg  60 mcg Intravenous Q Sat-HD Mansy, Jan A, MD   60 mcg at 11/08/20 1359   docusate sodium (COLACE) capsule 100 mg  100 mg Oral BID Mansy, Jan A, MD   100 mg at 11/09/20 0853   furosemide (LASIX) injection 60 mg  60 mg Intravenous Q12H Mansy, Jan A, MD   60 mg at 11/09/20 0535   heparin injection 5,000 Units  5,000 Units Subcutaneous Q8H Mansy, Jan A, MD   5,000 Units at 11/09/20 0536    lacosamide (VIMPAT) tablet 50 mg  50 mg Oral BID Mansy, Jan A, MD   50 mg at 11/09/20 0854   levETIRAcetam (KEPPRA) tablet 500 mg  500 mg Oral QHS Judd Gaudier V, MD   500 mg at 11/08/20 2149   magnesium hydroxide (MILK OF MAGNESIA) suspension 30 mL  30 mL Oral Daily PRN Mansy, Jan A, MD       melatonin tablet 5 mg  5 mg Oral QHS Mansy, Jan A, MD   5 mg at 11/08/20 2150   methocarbamol (ROBAXIN) tablet 500 mg  500 mg Oral Q6H PRN Mansy, Jan A, MD       metoCLOPramide (REGLAN) tablet 5 mg  5 mg Oral QPC breakfast Mansy, Arvella Merles, MD  5 mg at 11/09/20 0854   metoprolol tartrate (LOPRESSOR) 25 mg/10 mL oral suspension 6.25 mg  6.25 mg Oral BID Mansy, Jan A, MD   6.25 mg at 11/09/20 0854   multivitamin (RENA-VIT) tablet 1 tablet  1 tablet Oral QHS Mansy, Jan A, MD   1 tablet at 11/08/20 2149   ondansetron (ZOFRAN) tablet 4 mg  4 mg Oral Q6H PRN Mansy, Jan A, MD       Or   ondansetron [facility]) injection 4 mg  4 mg Intravenous Q6H PRN Mansy, Jan A, MD       pantoprazole (PROTONIX) EC tablet 40 mg  40 mg Oral Daily Mansy, Jan A, MD   40 mg at 11/09/20 9747   prochlorperazine (COMPAZINE) tablet 10 mg  10 mg Oral Q8H PRN Mansy, Jan A, MD       traZODone (DESYREL) tablet 25 mg  25 mg Oral QHS PRN Mansy, Jan A, MD       traZODone (DESYREL) tablet 25-50 mg  25-50 mg Oral QHS PRN Mansy, Jan A, MD   50 mg at 11/09/20 0125     Discharge Medications: Please see discharge summary for a list of discharge medications.  Relevant Imaging Results:  Relevant Lab Results:   Additional Information SS #: [id number] [place], LCSW

## 2020-11-09 NOTE — Progress Notes (Signed)
RN alerted by pt about a growing reddened area on LUE. Pt states she just noticed it on 7/9 but it looks as though it is becoming larger and feels like it is leaking. RN wrapped LUE in kerlix gauze. Pt called out for RN to assess site again. RN noted that LUE redness was growing above the kerlix gauze. RN outlined red area. Pt LUE pulses +3. Site is slightly warm to touch. Pt states she has not notified provider of LUE redness or concerns, has not had site assessed until now. Provider Damita Dunnings notified via chat.

## 2020-11-09 NOTE — Progress Notes (Signed)
HD start, seq tx, 2.5 hr

## 2020-11-09 NOTE — Progress Notes (Signed)
Pre HD info 

## 2020-11-09 NOTE — TOC Initial Note (Addendum)
Transition of Care [facility]) - Initial/Assessment Note    Patient Details  Name: Amy [name] MRN: [id number] Date of Birth: 04/29/1951  Transition of Care [facility]) CM/SW Contact:    Magnus Ivan, LCSW Phone Number: 11/09/2020, 10:33 AM  Clinical Narrative:                CSW met with patient and spouse at bedside. Patient lives with her husband. Patient was at [facility] for short term rehab prior to this hospital stay and plans to return there at discharge. PCP is Dr. Caryn Section. [facility] [address]. TOC will follow to facilitate discharge when patient is medically ready.  11:50- Per rounds patient may be ready for DC today. Reached out to Seth Bake at [facility] who stated they can take patient back today, but only if it is before 1pm. Asked RN and MD. If not it would have to be Monday. Per RN patient has not had dialysis yet.   Per MD patient needs inpatient dialysis again tomorrow. Updated Seth Bake.   Expected Discharge Plan: Skilled Nursing Facility Barriers to Discharge: Continued Medical Work up   Patient Goals and CMS Choice Patient states their goals for this hospitalization and ongoing recovery are:: SNF CMS Medicare.gov Compare Post Acute Care list provided to:: Patient Choice offered to / list presented to : Patient, Spouse  Expected Discharge Plan and Services Expected Discharge Plan: [place]       Living arrangements for the past 2 months: Single Family Home                                      Prior Living Arrangements/Services Living arrangements for the past 2 months: Single Family Home Lives with:: Spouse Patient language and need for interpreter reviewed:: Yes Do you feel safe going back to the place where you live?: Yes      Need for Family Participation in Patient Care: Yes (Comment) Care giver support system in place?: Yes (comment)   Criminal Activity/Legal Involvement Pertinent to Current Situation/Hospitalization: No  - Comment as needed  Activities of Daily Living Home Assistive Devices/Equipment: Contact lenses ADL Screening (condition at time of admission) Patient's cognitive ability adequate to safely complete daily activities?: Yes Is the patient deaf or have difficulty hearing?: No Does the patient have difficulty seeing, even when wearing glasses/contacts?: No Does the patient have difficulty concentrating, remembering, or making decisions?: No Patient able to express need for assistance with ADLs?: Yes Does the patient have difficulty dressing or bathing?: Yes Independently performs ADLs?: No Communication: Independent Dressing (OT): Needs assistance Is this a change from baseline?: Change from baseline, expected to last >3 days Grooming: Needs assistance Is this a change from baseline?: Change from baseline, expected to last >3 days Feeding: Independent Bathing: Needs assistance Is this a change from baseline?: Change from baseline, expected to last >3 days Toileting: Dependent Is this a change from baseline?: Change from baseline, expected to last >3days In/Out Bed: Needs assistance Is this a change from baseline?: Change from baseline, expected to last >3 days Walks in Home: Needs assistance Is this a change from baseline?: Change from baseline, expected to last >3 days (unsure of baseline) Does the patient have difficulty walking or climbing stairs?: Yes Weakness of Legs: Both Weakness of Arms/Hands: None  Permission Sought/Granted Permission sought to share information with : Facility Sport and exercise psychologist, Family Supports Permission granted  to share information with : Yes, Verbal Permission Granted  Share Information with NAME: Legrand Como - spouse  Permission granted to share info w AGENCY: [facility]        Emotional Assessment       Orientation: : Oriented to Self, Oriented to Place, Oriented to  Time, Oriented to Situation Alcohol / Substance Use: Not Applicable Psych  Involvement: No (comment)  Admission diagnosis:  Pulmonary edema [J81.1] ESRD (end stage renal disease) ([place]) [W26.3] Complication associated with dialysis catheter [T82.9XXA] Patient Active Problem List   Diagnosis Date Noted   Pulmonary edema 11/07/2020   CAD (coronary artery disease) of artery bypass graft 09/15/2020   History of nausea and vomiting 09/15/2020   Sleep disturbance    SDH (subdural hematoma) ([place]) 08/07/2020   Acute on chronic anemia    Subdural hemorrhage following injury ([place]) 08/04/2020   Orthostasis    Gastroparesis    Chronic combined systolic and diastolic congestive heart failure (HCC)    Prolonged Q-T interval on ECG    Nausea    Hypoalbuminemia due to protein-calorie malnutrition (HCC)    Nausea and vomiting    Chronic combined systolic and diastolic CHF (congestive heart failure) (HCC)    Anemia of chronic disease    Seizures (HCC)    Chronic pain syndrome    Debility 07/04/2020   Ischemic cardiomyopathy    Protein-calorie malnutrition, severe 06/23/2020   Non-ST elevation (NSTEMI) myocardial infarction ([place])    Pressure injury of skin 06/21/2020   C. difficile diarrhea 06/19/2020   C. difficile colitis 06/19/2020   Seizure ([place]) 06/19/2020   Gastroenteritis due to COVID-19 virus 05/29/2020   Surgery, elective    S/P ORIF (open reduction internal fixation) fracture    Closed right hip fracture, initial encounter ([place]) 04/22/2020   Hypotension 12/16/2019   ESRD on dialysis ([place]) 12/16/2019   Depression with anxiety 12/16/2019   Hypomagnesemia 12/16/2019   Atrial flutter (HCC)    Atrial fibrillation with RVR ([place]) 04/03/2019   Benign essential HTN 04/03/2019   HLD (hyperlipidemia) 04/03/2019   End stage renal disease ([place]) 12/13/2018   Itching 11/08/2018   Hyperparathyroidism, secondary renal ([place]) 09/13/2017   Ground glass opacity present on imaging of lung 04/08/2017   Osteoarthritis of knee 12/29/2016   Normocytic anemia 10/14/2016    Swelling of limb    Chronic headaches    Gastritis    History of adenomatous polyp of colon 09/07/2016   Chronic abdominal pain 09/05/2016   Iron deficiency anemia 09/05/2016   Hyponatremia 09/05/2016   Multiple thyroid nodules 07/04/2015   Right thyroid nodule 06/27/2015   Allergic rhinitis, seasonal 12/03/2014   Fever blister 12/03/2014   Herniated lumbar intervertebral disc 12/03/2014   Hypokalemia 12/03/2014   Recurrent sinus infections 12/03/2014   Insomnia 11/05/2014   Acquired scoliosis 06/26/2014   Intervertebral disc stenosis of neural canal of lumbar region 06/20/2014   Lumbar facet arthropathy 05/21/2014   Degenerative spondylolisthesis 03/12/2014   OP (osteoporosis) 08/10/2013   Idiopathic peripheral neuropathy 03/24/2009   Edema 12/27/2007   Headache, tension-type 10/25/2006   Essential hypertension 10/24/2006   Anxiety 05/03/1998   Esophageal reflux 05/03/1998   PCP:  Murlean Iba, MD Pharmacy:   [id number] Lorina Rabon, [place] [address] [place] [address] Phone: [phone] Fax: [phone]  [id number] [address]  [place] [address] Phone: [phone] Fax: [phone]  [id number] [place] [address]: [phone]: [phone]Phone: [phone]: [phone] [id number] [address] [place]Phone: [phone]: [phone] [facility] [address]: [phone]:  [phone]    Social Determinants of Health (SDOH) Interventions    Readmission Risk Interventions Readmission Risk Prevention Plan 11/09/2020 06/24/2020  Transportation Screening Complete Complete  PCP or Specialist Appt within 3-5 Days - Complete  HRI or HEphrata- Complete  Social Work Consult for RChristinePlanning/Counseling - Complete  Palliative Care Screening - Not Applicable  Medication Review (Press photographer Complete Complete  PCP or Specialist appointment within 3-5 days of discharge Complete -  HSultanaor Home Care Consult Complete -  SW Recovery Care/Counseling Consult Complete -  Palliative Care Screening Not Applicable -  SMount IvyComplete -  Some recent data might be hidden

## 2020-11-09 NOTE — Progress Notes (Signed)
[facility]  ROUNDING NOTE   Subjective:   Hemodialysis treatment yesterday. Tolerated treatment well. UF of 2.5 liters.   Patient unable to stand for a standing weight.   Continues to be fluid overloaded. Determined she needs an extra dialysis treatment to assist with volume status.    Objective:  Vital signs in last 24 hours:  Temp:  [97.6 F (36.4 C)-98.8 F (37.1 C)] 97.6 F (36.4 C) (07/10 0754) Pulse Rate:  [27-85] 75 (07/10 0754) Resp:  [9-21] 16 (07/10 0754) BP: (64-162)/(43-94) 147/77 (07/10 0754) SpO2:  [94 %-100 %] 94 % (07/10 0754) Weight:  [57.4 kg-61.2 kg] 57.4 kg (07/10 1000)  Weight change: 1.144 kg Filed Weights   11/08/20 0611 11/09/20 0500 11/09/20 1000  Weight: 62 kg 61.2 kg 57.4 kg    Intake/Output: I/O last 3 completed shifts: In: 81 [P.O.:580] Out: 3083 [Urine:550; Other:2533]   Intake/Output this shift:  No intake/output data recorded.  Physical Exam: General: NAD, laying in bed  Head: Normocephalic, atraumatic. Moist oral mucosal membranes  Eyes: Anicteric, PERRL  Neck: Supple, trachea midline  Lungs:  Clear to auscultation  Heart: Regular rate and rhythm  Abdomen:  Soft, nontender  Extremities:  ++ peripheral edema.  Neurologic: Nonfocal, moving all four extremities  Skin: No lesions  Access: RIJ permcath, PD catheter    Basic Metabolic Panel: Recent Labs  Lab 11/07/20 1415 11/08/20 0437 11/09/20 0421  NA 122* 125* 129*  K 4.3 4.0 3.4*  CL 96* 98 98  CO2 18* 23 28  GLUCOSE 98 76 94  BUN 34* 35* 23  CREATININE 3.20* 3.38* 2.39*  CALCIUM 7.8* 8.0* 7.5*  MG  --   --  1.5*     Liver Function Tests: No results for input(s): AST, ALT, ALKPHOS, BILITOT, PROT, ALBUMIN in the last 168 hours. No results for input(s): LIPASE, AMYLASE in the last 168 hours. No results for input(s): AMMONIA in the last 168 hours.  CBC: Recent Labs  Lab 11/07/20 1415 11/09/20 0421  WBC 10.4 6.3  HGB 10.1* 8.1*  HCT 33.2*  25.0*  MCV 89.7 84.7  PLT 350 281     Cardiac Enzymes: No results for input(s): CKTOTAL, CKMB, CKMBINDEX, TROPONINI in the last 168 hours.  BNP: Invalid input(s): POCBNP  CBG: No results for input(s): GLUCAP in the last 168 hours.  Microbiology: Results for orders placed or performed during the hospital encounter of 11/07/20  Resp Panel by RT-PCR (Flu A&B, Covid) Nasopharyngeal Swab     Status: None   Collection Time: 11/07/20  4:00 PM   Specimen: Nasopharyngeal Swab; Nasopharyngeal(NP) swabs in vial transport medium  Result Value Ref Range Status   SARS Coronavirus 2 by RT PCR NEGATIVE NEGATIVE Final    Comment: (NOTE) SARS-CoV-2 target nucleic acids are NOT DETECTED.  The SARS-CoV-2 RNA is generally detectable in upper respiratory specimens during the acute phase of infection. The lowest concentration of SARS-CoV-2 viral copies this assay can detect is 138 copies/mL. A negative result does not preclude SARS-Cov-2 infection and should not be used as the sole basis for treatment or other patient management decisions. A negative result may occur with  improper specimen collection/handling, submission of specimen other than nasopharyngeal swab, presence of viral mutation(s) within the areas targeted by this assay, and inadequate number of viral copies(<138 copies/mL). A negative result must be combined with clinical observations, patient history, and epidemiological information. The expected result is Negative.  Fact Sheet for Patients:  EntrepreneurPulse.com.au  Fact Sheet for  Healthcare Providers:  IncredibleEmployment.be  This test is no t yet approved or cleared by the [place] and  has been authorized for detection and/or diagnosis of SARS-CoV-2 by FDA under an Emergency Use Authorization (EUA). This EUA will remain  in effect (meaning this test can be used) for the duration of the COVID-19 declaration under Section  564(b)(1) of the Act, 21 U.S.C.section 360bbb-3(b)(1), unless the authorization is terminated  or revoked sooner.       Influenza A by PCR NEGATIVE NEGATIVE Final   Influenza B by PCR NEGATIVE NEGATIVE Final    Comment: (NOTE) The Xpert Xpress SARS-CoV-2/FLU/RSV plus assay is intended as an aid in the diagnosis of influenza from Nasopharyngeal swab specimens and should not be used as a sole basis for treatment. Nasal washings and aspirates are unacceptable for Xpert Xpress SARS-CoV-2/FLU/RSV testing.  Fact Sheet for Patients: EntrepreneurPulse.com.au  Fact Sheet for Healthcare Providers: IncredibleEmployment.be  This test is not yet approved or cleared by the [place] FDA and has been authorized for detection and/or diagnosis of SARS-CoV-2 by FDA under an Emergency Use Authorization (EUA). This EUA will remain in effect (meaning this test can be used) for the duration of the COVID-19 declaration under Section 564(b)(1) of the Act, 21 U.S.C. section 360bbb-3(b)(1), unless the authorization is terminated or revoked.  Performed at [facility], [address] [place], [place] [address]     Coagulation Studies: No results for input(s): LABPROT, INR in the last 72 hours.  Urinalysis: No results for input(s): COLORURINE, LABSPEC, PHURINE, GLUCOSEU, HGBUR, BILIRUBINUR, KETONESUR, PROTEINUR, UROBILINOGEN, NITRITE, LEUKOCYTESUR in the last 72 hours.  Invalid input(s): APPERANCEUR    Imaging: DG Chest 2 View  Result Date: 11/07/2020 CLINICAL DATA:  Nausea and vomiting, edema, dialysis port not functioning EXAM: CHEST - 2 VIEW COMPARISON:  July 25, 2020 FINDINGS: Right chest dialysis catheter with tip overlying the superior cavoatrial junction. The heart size and mediastinal contours are similar prior. Moderate right and small left pleural effusion. Mild diffuse interstitial opacities. The visualized skeletal structures are  unchanged. IMPRESSION: Interstitial edema with moderate right and small left pleural effusions. Electronically Signed   By: Dahlia Bailiff MD   On: 11/07/2020 15:38     Medications:     aspirin  81 mg Oral Daily   atorvastatin  40 mg Oral Daily   Chlorhexidine Gluconate Cloth  6 each Topical Daily   Darbepoetin Alfa  60 mcg Intravenous Q Sat-HD   docusate sodium  100 mg Oral BID   furosemide  60 mg Intravenous Q12H   heparin  5,000 Units Subcutaneous Q8H   lacosamide  50 mg Oral BID   levETIRAcetam  500 mg Oral QHS   melatonin  5 mg Oral QHS   metoCLOPramide  5 mg Oral QPC breakfast   metoprolol tartrate  6.25 mg Oral BID   multivitamin  1 tablet Oral QHS   pantoprazole  40 mg Oral Daily   acetaminophen **OR** acetaminophen, butalbital-acetaminophen-caffeine, camphor-menthol, magnesium hydroxide, methocarbamol, ondansetron **OR** ondansetron (ZOFRAN) IV, prochlorperazine, traZODone, traZODone  Assessment/ Plan:  Amy [name] is a 70 y.o. white female with end stage renal disease on hemodialysis, hypertension, hyperlipidemia, seizure disorder, GERD, anxiety who is admitted to [facility] on 11/07/2020 for Pulmonary edema [J81.1] ESRD (end stage renal disease) ([place]) [N62.9] Complication associated with dialysis catheter [T82.9XXA]  [facility] PD - on back up hemodialysis Hemodialysis: MWF Shanon Payor RIJ permcath 53 kg  End stage renal disease: on back up hemodialysis.  Currently over 10 kg above her estimated dry weight on admission.  Bed weight this morning 57.4kg (4.4 kg above dry weight) - Scheduled for an extra treatment with sequential ultrafiltration only. UF goal of 2 liters. Orders prepared.   - flush peritoneal dialysis catheter  Hypertension: 147/77. Continue on metoprolol and furosemide.   Anemia of chronic kidney disease: hemoglobin 8.1. Aranesp given on dialysis treatment 7/9. Gets EPO with each treatment as outpatient    Secondary Hyperparathyroidism: currently not on  binders.  Hyponatremia: improved with hemodialysis. Related to volume overload.     LOS: 2 Amy [name] 7/10/202211:48 AM

## 2020-11-10 ENCOUNTER — Inpatient Hospital Stay
Admission: EM | Disposition: A | Discharge status: Skilled Nursing Facility | Payer: Self-pay | Source: Home / Self Care | Attending: Hospitalist

## 2020-11-10 ENCOUNTER — Other Ambulatory Visit (INDEPENDENT_AMBULATORY_CARE_PROVIDER_SITE_OTHER): Payer: Self-pay | Admitting: Vascular Surgery

## 2020-11-10 DIAGNOSIS — N186 End stage renal disease: Secondary | ICD-10-CM

## 2020-11-10 DIAGNOSIS — J810 Acute pulmonary edema: Secondary | ICD-10-CM | POA: Diagnosis not present

## 2020-11-10 DIAGNOSIS — T829XXA Unspecified complication of cardiac and vascular prosthetic device, implant and graft, initial encounter: Secondary | ICD-10-CM | POA: Diagnosis not present

## 2020-11-10 DIAGNOSIS — Z452 Encounter for adjustment and management of vascular access device: Secondary | ICD-10-CM

## 2020-11-10 HISTORY — PX: DIALYSIS/PERMA CATHETER INSERTION: CATH118288

## 2020-11-10 LAB — MAGNESIUM: Magnesium: 1.9 mg/dL (ref 1.7–2.4)

## 2020-11-10 LAB — BASIC METABOLIC PANEL
Anion gap: 4 — ABNORMAL LOW (ref 5–15)
BUN: 25 mg/dL — ABNORMAL HIGH (ref 8–23)
CO2: 27 mmol/L (ref 22–32)
Calcium: 7.7 mg/dL — ABNORMAL LOW (ref 8.9–10.3)
Chloride: 97 mmol/L — ABNORMAL LOW (ref 98–111)
Creatinine, Ser: 2.87 mg/dL — ABNORMAL HIGH (ref 0.44–1.00)
GFR, Estimated: 17 mL/min — ABNORMAL LOW (ref >60)
Glucose, Bld: 91 mg/dL (ref 70–99)
Potassium: 3.1 mmol/L — ABNORMAL LOW (ref 3.5–5.1)
Sodium: 128 mmol/L — ABNORMAL LOW (ref 135–145)

## 2020-11-10 LAB — CBC
HCT: 25.8 % — ABNORMAL LOW (ref 36.0–46.0)
Hemoglobin: 8.3 g/dL — ABNORMAL LOW (ref 12.0–15.0)
MCH: 27.3 pg (ref 26.0–34.0)
MCHC: 32.2 g/dL (ref 30.0–36.0)
MCV: 84.9 fL (ref 80.0–100.0)
Platelets: 273 10*3/uL (ref 150–400)
RBC: 3.04 MIL/uL — ABNORMAL LOW (ref 3.87–5.11)
RDW: 15.2 % (ref 11.5–15.5)
WBC: 6.3 10*3/uL (ref 4.0–10.5)
nRBC: 0 % (ref 0.0–0.2)

## 2020-11-10 LAB — PHOSPHORUS: Phosphorus: 3.3 mg/dL (ref 2.5–4.6)

## 2020-11-10 SURGERY — DIALYSIS/PERMA CATHETER INSERTION
Anesthesia: Moderate Sedation

## 2020-11-10 MED ORDER — FAMOTIDINE 40 MG PO TABS
40.0000 mg | ORAL_TABLET | Freq: Once | ORAL | Status: DC | PRN
  Filled 2020-11-10: qty 1

## 2020-11-10 MED ORDER — DIPHENHYDRAMINE HCL 50 MG/ML IJ SOLN: 50.0000 mg | Freq: Once | INTRAMUSCULAR | Status: DC | PRN

## 2020-11-10 MED ORDER — HEPARIN SODIUM (PORCINE) 10000 UNIT/ML IJ SOLN
INTRAMUSCULAR | Status: AC
  Filled 2020-11-10: qty 1

## 2020-11-10 MED ORDER — MIDAZOLAM HCL 2 MG/ML PO SYRP: 8.0000 mg | ORAL_SOLUTION | Freq: Once | ORAL | Status: DC | PRN

## 2020-11-10 MED ORDER — SODIUM CHLORIDE 0.9 % IV SOLN: INTRAVENOUS | Status: DC

## 2020-11-10 MED ORDER — EPOETIN ALFA 10000 UNIT/ML IJ SOLN
10000.0000 [IU] | INTRAMUSCULAR | Status: DC
  Administered 2020-11-10: 10000 [IU] via INTRAVENOUS

## 2020-11-10 MED ORDER — CLINDAMYCIN PHOSPHATE 300 MG/50ML IV SOLN
INTRAVENOUS | Status: AC
  Filled 2020-11-10: qty 50

## 2020-11-10 MED ORDER — MIDAZOLAM HCL 2 MG/2ML IJ SOLN
INTRAMUSCULAR | Status: AC
  Filled 2020-11-10: qty 2

## 2020-11-10 MED ORDER — HYDROMORPHONE HCL 1 MG/ML IJ SOLN: 1.0000 mg | Freq: Once | INTRAMUSCULAR | Status: DC | PRN

## 2020-11-10 MED ORDER — LEVETIRACETAM 500 MG PO TABS
500.0000 mg | ORAL_TABLET | ORAL | Status: DC
  Administered 2020-11-12: 500 mg via ORAL
  Filled 2020-11-10 (×3): qty 1

## 2020-11-10 MED ORDER — FENTANYL CITRATE (PF) 100 MCG/2ML IJ SOLN
INTRAMUSCULAR | Status: AC
  Filled 2020-11-10: qty 2

## 2020-11-10 MED ORDER — METHYLPREDNISOLONE SODIUM SUCC 125 MG IJ SOLR: 125.0000 mg | Freq: Once | INTRAMUSCULAR | Status: DC | PRN

## 2020-11-10 MED ORDER — CLINDAMYCIN PHOSPHATE 300 MG/50ML IV SOLN
300.0000 mg | Freq: Once | INTRAVENOUS | Status: AC
  Administered 2020-11-10: 300 mg via INTRAVENOUS
  Filled 2020-11-10: qty 50

## 2020-11-10 MED ORDER — ONDANSETRON HCL 4 MG/2ML IJ SOLN: 4.0000 mg | Freq: Four times a day (QID) | INTRAMUSCULAR | Status: DC | PRN

## 2020-11-10 MED ORDER — FENTANYL CITRATE (PF) 100 MCG/2ML IJ SOLN
INTRAMUSCULAR | Status: DC | PRN
  Administered 2020-11-10: 50 ug via INTRAVENOUS

## 2020-11-10 MED ORDER — MIDAZOLAM HCL 2 MG/2ML IJ SOLN
INTRAMUSCULAR | Status: DC | PRN
  Administered 2020-11-10: 1 mg via INTRAVENOUS

## 2020-11-10 SURGICAL SUPPLY — 3 items
CATH PALIN MAXID VT KIT 19CM (CATHETERS) ×2 IMPLANT
GUIDEWIRE SUPER STIFF .035X180 (WIRE) ×2 IMPLANT
PACK ANGIOGRAPHY (CUSTOM PROCEDURE TRAY) ×2 IMPLANT

## 2020-11-10 NOTE — Care Management Important Message (Signed)
Important Message  Patient Details  Name: Amy [name] MRN: [id number] Date of Birth: 16-Aug-1950   Medicare Important Message Given:  Yes     [name] 11/10/2020, 11:43 AM

## 2020-11-10 NOTE — Consult Note (Signed)
[facility] Admission History & Physical  MRN : [id number]  Amy [name] is a 70 y.o. (06-03-1950) female who presents with chief complaint of nonfunctioning PermCath.  History of Present Illness:  I am asked to evaluate the patient by her inpatient nephrologist Dr. Juleen China.  During dialysis this a.m., dialysis staff needed to reverse her PermCath lines to effectively achieve dialysis.  At that time, vascular surgery was consulted to exchange the patient's PermCath in the setting of a nonfunctioning PermCath.     Patient denies pain or tenderness overlying the access.  There is no pain with dialysis.  Patient denies fevers or shaking chills while on dialysis.    Current Facility-Administered Medications  Medication Dose Route Frequency Provider Last Rate Last Admin   0.9 %  sodium chloride infusion   Intravenous Continuous Masaru Chamberlin A, PA-C       acetaminophen (TYLENOL) tablet 650 mg  650 mg Oral Q6H PRN Mansy, Jan A, MD   650 mg at 11/09/20 1414   Or   acetaminophen (TYLENOL) suppository 650 mg  650 mg Rectal Q6H PRN Mansy, Jan A, MD       aspirin chewable tablet 81 mg  81 mg Oral Daily Mansy, Jan A, MD   81 mg at 11/10/20 0827   atorvastatin (LIPITOR) tablet 40 mg  40 mg Oral Daily Mansy, Jan A, MD   40 mg at 11/10/20 0827   butalbital-acetaminophen-caffeine (FIORICET) 50-325-40 MG per tablet 1 tablet  1 tablet Oral TID PRN Mansy, Jan A, MD   1 tablet at 11/09/20 1532   camphor-menthol (SARNA) lotion   Topical PRN Mansy, Jan A, MD       Chlorhexidine Gluconate Cloth 2 % PADS 6 each  6 each Topical Daily Mansy, Jan A, MD   6 each at 11/09/20 0958   clindamycin (CLEOCIN) IVPB 300 mg  300 mg Intravenous Once Algernon Huxley, MD       diphenhydrAMINE (BENADRYL) injection 50 mg  50 mg Intravenous Once PRN Svara Twyman A, PA-C       docusate sodium (COLACE) capsule 100 mg  100 mg Oral BID Mansy, Jan A, MD   100 mg at 11/10/20 0827   epoetin alfa (EPOGEN)  injection 10,000 Units  10,000 Units Intravenous Q M,W,F-HD Kolluru, Sarath, MD   10,000 Units at 11/10/20 1122   famotidine (PEPCID) tablet 40 mg  40 mg Oral Once PRN Jaedon Siler A, PA-C       furosemide (LASIX) injection 60 mg  60 mg Intravenous Q12H Mansy, Jan A, MD   60 mg at 11/10/20 0514   heparin injection 5,000 Units  5,000 Units Subcutaneous Q8H Mansy, Jan A, MD   5,000 Units at 11/10/20 0519   HYDROmorphone (DILAUDID) injection 1 mg  1 mg Intravenous Once PRN Kyrah Schiro A, PA-C       lacosamide (VIMPAT) tablet 50 mg  50 mg Oral BID Mansy, Jan A, MD   50 mg at 11/10/20 0827   levETIRAcetam (KEPPRA) tablet 500 mg  500 mg Oral QHS Judd Gaudier V, MD   500 mg at 11/09/20 2141   [START ON 11/12/2020] levETIRAcetam (KEPPRA) tablet 500 mg  500 mg Oral Q M,W,F Enzo Bi, MD       magnesium hydroxide (MILK OF MAGNESIA) suspension 30 mL  30 mL Oral Daily PRN Mansy, Jan A, MD       melatonin tablet 5 mg  5 mg Oral QHS Mansy, Jan A,  MD   5 mg at 11/09/20 2143   methocarbamol (ROBAXIN) tablet 500 mg  500 mg Oral Q6H PRN Mansy, Jan A, MD       methylPREDNISolone sodium succinate (SOLU-MEDROL) 125 mg/2 mL injection 125 mg  125 mg Intravenous Once PRN Jacalynn Buzzell, Joelene Millin A, PA-C       metoCLOPramide (REGLAN) tablet 5 mg  5 mg Oral QPC breakfast Mansy, Jan A, MD   5 mg at 11/10/20 0827   metoprolol tartrate (LOPRESSOR) 25 mg/10 mL oral suspension 6.25 mg  6.25 mg Oral BID Mansy, Jan A, MD   6.25 mg at 11/09/20 2142   midazolam (VERSED) 2 MG/ML syrup 8 mg  8 mg Oral Once PRN Kea Callan A, PA-C       multivitamin (RENA-VIT) tablet 1 tablet  1 tablet Oral QHS Mansy, Jan A, MD   1 tablet at 11/09/20 2141   ondansetron (ZOFRAN) tablet 4 mg  4 mg Oral Q6H PRN Mansy, Jan A, MD       Or   ondansetron [facility]) injection 4 mg  4 mg Intravenous Q6H PRN Mansy, Jan A, MD       ondansetron [facility]) injection 4 mg  4 mg Intravenous Q6H PRN Satish Hammers A, PA-C       pantoprazole  (PROTONIX) EC tablet 40 mg  40 mg Oral Daily Mansy, Jan A, MD   40 mg at 11/10/20 0827   prochlorperazine (COMPAZINE) tablet 10 mg  10 mg Oral Q8H PRN Mansy, Jan A, MD       traZODone (DESYREL) tablet 25 mg  25 mg Oral QHS PRN Mansy, Jan A, MD       traZODone (DESYREL) tablet 25-50 mg  25-50 mg Oral QHS PRN Mansy, Jan A, MD   50 mg at 11/09/20 0125   Past Surgical History:  Procedure Laterality Date   ABDOMINAL HYSTERECTOMY  2003   due to heavy periods and clotting during menses   BREAST LUMPECTOMY Left 1990   CHOLECYSTECTOMY  2010   COLONOSCOPY WITH PROPOFOL N/A 09/07/2016   Procedure: COLONOSCOPY WITH PROPOFOL;  Surgeon: Lucilla Lame, MD;  Location: [facility] ENDOSCOPY;  Service: Endoscopy;  Laterality: N/A;   CORONARY STENT INTERVENTION N/A 06/27/2020   Procedure: CORONARY STENT INTERVENTION;  Surgeon: Sherren Mocha, MD;  Location: [place] [facility];  Service: Cardiovascular;  Laterality: N/A;   DIALYSIS/PERMA CATHETER INSERTION N/A 11/16/2018   Procedure: DIALYSIS/PERMA CATHETER INSERTION;  Surgeon: Algernon Huxley, MD;  Location: [place] [facility];  Service: Cardiovascular;  Laterality: N/A;   DIALYSIS/PERMA CATHETER REMOVAL N/A 08/16/2019   Procedure: DIALYSIS/PERMA CATHETER REMOVAL;  Surgeon: Algernon Huxley, MD;  Location: [place] [facility];  Service: Cardiovascular;  Laterality: N/A;   DILATION AND CURETTAGE OF UTERUS  1985   ESOPHAGOGASTRODUODENOSCOPY (EGD) WITH PROPOFOL N/A 09/07/2016   Procedure: ESOPHAGOGASTRODUODENOSCOPY (EGD) WITH PROPOFOL;  Surgeon: Lucilla Lame, MD;  Location: [facility] ENDOSCOPY;  Service: Endoscopy;  Laterality: N/A;   GALLBLADDER SURGERY  2012   INTRAMEDULLARY (IM) NAIL INTERTROCHANTERIC Right 04/23/2020   Procedure: INTRAMEDULLARY (IM) NAIL INTERTROCHANTRIC;  Surgeon: Thornton Park, MD;  Location: [facility] ORS;  Service: Orthopedics;  Laterality: Right;   INTRAVASCULAR IMAGING/OCT N/A 06/27/2020   Procedure: INTRAVASCULAR IMAGING/OCT;  Surgeon: Sherren Mocha, MD;   Location: [place] [facility];  Service: Cardiovascular;  Laterality: N/A;   IR FLUORO GUIDE CV LINE RIGHT  09/04/2020   IR US GUIDE VASC ACCESS RIGHT  09/04/2020   KNEE SURGERY     Dr. Tamala Julian  LEFT HEART CATH AND CORONARY ANGIOGRAPHY N/A 06/25/2020   Procedure: LEFT HEART CATH AND CORONARY ANGIOGRAPHY;  Surgeon: Troy Sine, MD;  Location: [place] [facility];  Service: Cardiovascular;  Laterality: N/A;   TONSILLECTOMY     TONSILLECTOMY AND ADENOIDECTOMY  1970   UPPER GI ENDOSCOPY  03/02/2006   H. Pylori negative; Dr. Allen Norris   Social History Social History   Tobacco Use   Smoking status: Never   Smokeless tobacco: Never  Vaping Use   Vaping Use: Never used  Substance Use Topics   Alcohol use: Yes    Alcohol/week: 0.0 - 2.0 standard drinks   Drug use: No   Family History Family History  Problem Relation Age of Onset   Pancreatic cancer Father    Congestive Heart Failure Maternal Grandmother    Cancer Paternal Grandfather    Heart disease Maternal Uncle   No family history of bleeding or clotting disorders, autoimmune disease or porphyria.  Allergies  Allergen Reactions   Cephalosporins Hives and Swelling   Clarithromycin Other (See Comments)    Unknown reaction   Ensure [Nutritional Supplements] Nausea And Vomiting   Gabapentin    Lunesta  [Eszopiclone]     Heart racing   Nepro [Compleat] Nausea And Vomiting   Penicillin V     Has patient had a PCN reaction causing immediate rash, facial/tongue/throat swelling, SOB or lightheadedness with hypotension: Yes Has patient had a PCN reaction causing severe rash involving mucus membranes or skin necrosis: No Has patient had a PCN reaction that required hospitalization No Has patient had a PCN reaction occurring within the last 10 years: No If all of the above answers are "NO", then may proceed with Cephalosporin use.   Sulfa Antibiotics     Unknown reaction   REVIEW OF SYSTEMS (Negative unless checked)  Constitutional:  [] Weight loss  [] Fever  [] Chills Cardiac: [] Chest pain   [] Chest pressure   [] Palpitations   [] Shortness of breath when laying flat   [] Shortness of breath at rest   [x] Shortness of breath with exertion. Vascular:  [] Pain in legs with walking   [] Pain in legs at rest   [] Pain in legs when laying flat   [] Claudication   [] Pain in feet when walking  [] Pain in feet at rest  [] Pain in feet when laying flat   [] History of DVT   [] Phlebitis   [] Swelling in legs   [] Varicose veins   [] Non-healing ulcers Pulmonary:   [] Uses home oxygen   [] Productive cough   [] Hemoptysis   [] Wheeze  [] COPD   [] Asthma Neurologic:  [] Dizziness  [] Blackouts   [] Seizures   [] History of stroke   [] History of TIA  [] Aphasia   [] Temporary blindness   [] Dysphagia   [] Weakness or numbness in arms   [] Weakness or numbness in legs Musculoskeletal:  [] Arthritis   [] Joint swelling   [] Joint pain   [] Low back pain Hematologic:  [] Easy bruising  [] Easy bleeding   [] Hypercoagulable state   [] Anemic  [] Hepatitis Gastrointestinal:  [] Blood in stool   [] Vomiting blood  [] Gastroesophageal reflux/heartburn   [] Difficulty swallowing. Genitourinary:  [x] Chronic kidney disease   [] Difficult urination  [] Frequent urination  [] Burning with urination   [] Blood in urine Skin:  [] Rashes   [] Ulcers   [] Wounds Psychological:  [] History of anxiety   []  History of major depression.  Physical Examination  Vitals:   11/10/20 1145 11/10/20 1200 11/10/20 1215 11/10/20 1230  BP: 139/83 131/81 133/82 (!) 151/67  Pulse:  Resp: 17 12 17 13   Temp:      TempSrc:      SpO2:      Weight:      Height:       Body mass index is 21.34 kg/m. Gen: WD/WN, NAD Head: [place]/AT, No temporalis wasting. Prominent temp pulse not noted. Ear/Nose/Throat: Hearing grossly intact, nares w/o erythema or drainage, oropharynx w/o Erythema/Exudate,  Eyes: Conjunctiva clear, sclera non-icteric Neck: Trachea midline.  No JVD.  Pulmonary:  Good air movement, respirations not  labored, no use of accessory muscles.  Cardiac: RRR, normal S1, S2. Vascular: Right tunneled catheter without tenderness or drainage Vessel Right Left  Radial Palpable Palpable   Gastrointestinal: soft, non-tender/non-distended. No guarding/reflex.  Musculoskeletal: M/S 5/5 throughout.  Extremities without ischemic changes.  No deformity or atrophy.  Neurologic: Sensation grossly intact in extremities.  Symmetrical.  Speech is fluent. Motor exam as listed above. Psychiatric: Judgment intact, Mood & affect appropriate for pt's clinical situation. Dermatologic: No rashes or ulcers noted.  No cellulitis or open wounds. Lymph : No Cervical, Axillary, or Inguinal lymphadenopathy.  CBC Lab Results  Component Value Date   WBC 6.3 11/10/2020   HGB 8.3 (L) 11/10/2020   HCT 25.8 (L) 11/10/2020   MCV 84.9 11/10/2020   PLT 273 11/10/2020   BMET    Component Value Date/Time   NA 128 (L) 11/10/2020 0400   NA 135 11/08/2018 1620   K 3.1 (L) 11/10/2020 0400   CL 97 (L) 11/10/2020 0400   CO2 27 11/10/2020 0400   GLUCOSE 91 11/10/2020 0400   BUN 25 (H) 11/10/2020 0400   BUN 77 (HH) 11/08/2018 1620   CREATININE 2.87 (H) 11/10/2020 0400   CREATININE 1.93 (H) 04/05/2017 1451   CALCIUM 7.7 (L) 11/10/2020 0400   CALCIUM 8.2 (L) 08/13/2020 0739   GFRNONAA 17 (L) 11/10/2020 0400   GFRAA 7 (L) 12/17/2019 0444   Estimated Creatinine Clearance: 15.5 mL/min (A) (by C-G formula based on SCr of 2.87 mg/dL (H)).  COAG Lab Results  Component Value Date   INR 1.1 09/02/2020   INR 1.1 06/19/2020   INR 1.2 04/04/2019   Radiology DG Chest 2 View  Result Date: 11/07/2020 CLINICAL DATA:  Nausea and vomiting, edema, dialysis port not functioning EXAM: CHEST - 2 VIEW COMPARISON:  July 25, 2020 FINDINGS: Right chest dialysis catheter with tip overlying the superior cavoatrial junction. The heart size and mediastinal contours are similar prior. Moderate right and small left pleural effusion. Mild diffuse  interstitial opacities. The visualized skeletal structures are unchanged. IMPRESSION: Interstitial edema with moderate right and small left pleural effusions. Electronically Signed   By: Dahlia Bailiff MD   On: 11/07/2020 15:38    Assessment/Plan 1.  Complication dialysis access:   Patient's right tunneled catheter is thrombosed. The patient will undergo exchange of the catheter same venous access using interventional techniques.  The risks and benefits were described to the patient.  All questions were answered.  The patient agrees to proceed with intervention.  2.  End-stage renal disease requiring hemodialysis:  Patient will continue dialysis therapy without further interruption if a successful exchange is not achieved then new site will be found for tunneled catheter placement. Dialysis has already been arranged since the patient missed their previous session 3.  Hypertension:  Patient will continue medical management; nephrology is following no changes in oral medications.  Discussed with Dr. Mayme Genta, PA-C  11/10/2020 1:49 PM

## 2020-11-10 NOTE — TOC Progression Note (Signed)
Transition of Care [facility]) - Progression Note    Patient Details  Name: KAIMANA NEUZIL MRN: [id number] Date of Birth: [birth year]  Transition of [facility]) CM/SW Contact  Candie Chroman, LCSW Phone Number: 11/10/2020, 10:11 AM  Clinical Narrative:  Hessie Knows admissions coordinator said patient will need PT and OT evaluations prior to return. MD has consulted them.  Expected Discharge Plan: Skilled Nursing Facility Barriers to Discharge: Continued Medical Work up  Expected Discharge Plan and Services Expected Discharge Plan: [place] arrangements for the past 2 months: Single Family Home                                       Social Determinants of Health (SDOH) Interventions    Readmission Risk Interventions Readmission Risk Prevention Plan 11/09/2020 06/24/2020  Transportation Screening Complete Complete  PCP or Specialist Appt within 3-5 Days - Complete  HRI or [place] - Complete  Social Work Consult for [place] Planning/Counseling - Complete  Palliative Care Screening - Not Applicable  Medication Review [employer] photographer) Complete Complete  PCP or Specialist appointment within 3-5 days of discharge Complete -  [place] or Home Care Consult Complete -  SW Recovery Care/Counseling Consult Complete -  Palliative Care Screening Not Applicable -  Skilled Nursing Facility Complete -  Some recent data might be hidden

## 2020-11-10 NOTE — H&P (View-Only) (Signed)
Subjective:   CC: PD catheter removal  HPI:  Amy [name] is a 70 y.o. female who was consulted by [facility] for evaluation of above.  Patient currently unable to receive PD dialysis catheter due to multiple comorbidities and need for SNF placement.  Nephrologist assessment is patient likely will need to stay on HD dialysis for the foreseeable future, therefore surgery requested for removal of PD cath.    Past Medical History:  has a past medical history of Anemia, Anxiety, Closed fracture of shaft of left ulna (11/27/2018), Colon polyp, ESRD on peritoneal dialysis ([place]), Gastritis, GERD (gastroesophageal reflux disease), History of chicken pox, History of measles as a child, Hypertension, Hyponatremia, Leukocytosis, Mesenteric lymphadenopathy (04/08/2017), and Multiple thyroid nodules.  Past Surgical History:  has a past surgical history that includes Cholecystectomy (2010); Gallbladder surgery (2012); Abdominal hysterectomy (2003); Breast lumpectomy (Left, 1990); Dilation and curettage of uterus (1985); Tonsillectomy and adenoidectomy (1970); Knee surgery; Upper gi endoscopy (03/02/2006); Tonsillectomy; Esophagogastroduodenoscopy (egd) with propofol (N/A, 09/07/2016); Colonoscopy with propofol (N/A, 09/07/2016); DIALYSIS/PERMA CATHETER INSERTION (N/A, 11/16/2018); DIALYSIS/PERMA CATHETER REMOVAL (N/A, 08/16/2019); Intramedullary (im) nail intertrochanteric (Right, 04/23/2020); LEFT HEART CATH AND CORONARY ANGIOGRAPHY (N/A, 06/25/2020); CORONARY STENT INTERVENTION (N/A, 06/27/2020); INTRAVASCULAR IMAGING/OCT (N/A, 06/27/2020); IR US Guide Vasc Access Right (09/04/2020); and IR Fluoro Guide CV Line Right (09/04/2020).  Family History: family history includes Cancer in her paternal grandfather; Congestive Heart Failure in her maternal grandmother; Heart disease in her maternal uncle; Pancreatic cancer in her father.  Social History:  reports that she has never smoked. She has never used smokeless tobacco. She reports  current alcohol use. She reports that she does not use drugs.  Current Medications:  Prior to Admission medications   Medication Sig Start Date End Date Taking? Authorizing Provider  acetaminophen (TYLENOL) 325 MG tablet Take 1-2 tablets (325-650 mg total) by mouth every 4 (four) hours as needed for mild pain. 09/12/20  Yes Love, Ivan Anchors, PA-C  aspirin 81 MG chewable tablet Chew 1 tablet (81 mg total) by mouth daily. 06/30/20  Yes Eulogio Bear U, DO  atorvastatin (LIPITOR) 40 MG tablet Take 1 tablet (40 mg total) by mouth daily. 09/13/20  Yes Love, Ivan Anchors, PA-C  atorvastatin (LIPITOR) 40 MG tablet Take 40 mg by mouth at bedtime. 08/04/20 08/04/21 Yes [provider]  brivaracetam (BRIVIACT) 25 MG TABS tablet Take 3 tablets (75 mg total) by mouth 2 (two) times daily. 09/12/20  Yes Love, Ivan Anchors, PA-C  butalbital-acetaminophen-caffeine (FIORICET) [phone] MG tablet Take 1 tablet by mouth 3 (three) times daily as needed for headache. 09/12/20  Yes Love, Ivan Anchors, PA-C  camphor-menthol [facility]) lotion Apply topically as needed for itching. 09/12/20  Yes Love, Ivan Anchors, PA-C  Chlorhexidine Gluconate Cloth 2 % PADS Apply 6 each topically daily. 09/13/20  Yes Love, Ivan Anchors, PA-C  Darbepoetin Alfa (ARANESP) 60 MCG/0.3ML SOSY injection Inject 0.3 mLs (60 mcg total) into the vein every Saturday with hemodialysis. 09/20/20  Yes Love, Ivan Anchors, PA-C  docusate sodium (COLACE) 100 MG capsule Take 1 capsule (100 mg total) by mouth 2 (two) times daily. 09/12/20  Yes Love, Ivan Anchors, PA-C  lacosamide (VIMPAT) 50 MG TABS tablet Take 1 tablet (50 mg total) by mouth 2 (two) times daily. 09/12/20  Yes Love, Ivan Anchors, PA-C  melatonin 5 MG TABS Take 1 tablet (5 mg total) by mouth at bedtime. 09/12/20  Yes Love, Ivan Anchors, PA-C  methocarbamol (ROBAXIN) 500 MG tablet Take 1 tablet (500 mg total) by mouth  every 6 (six) hours as needed for muscle spasms. 09/12/20  Yes Love, Ivan Anchors, PA-C  metoCLOPramide (REGLAN) 5 MG tablet  Take 1 tablet (5 mg total) by mouth daily after breakfast. 09/16/20  Yes Love, Ivan Anchors, PA-C  metoprolol tartrate (LOPRESSOR) 25 mg/10 mL SUSP Take 2.5 mLs (6.25 mg total) by mouth 2 (two) times daily. 09/12/20  Yes Love, Ivan Anchors, PA-C  multivitamin (RENA-VIT) TABS tablet Take 1 tablet by mouth at bedtime. 09/12/20  Yes Love, Ivan Anchors, PA-C  pantoprazole (PROTONIX) 40 MG tablet TAKE 1 TABLET (40 MG TOTAL) BY MOUTH DAILY. 06/30/20 06/30/21 Yes Vann, Jessica U, DO  traZODone (DESYREL) 50 MG tablet Take 0.5-1 tablets (25-50 mg total) by mouth at bedtime as needed for sleep. 09/12/20  Yes Love, Ivan Anchors, PA-C  gentamicin cream (GARAMYCIN) 0.1 % Apply 1 application topically daily. 07/24/20   Love, Ivan Anchors, PA-C  Melatonin 3 MG TBDP Take by mouth. 08/04/20   [provider]  melatonin 5 MG TABS Take 1 tablet (5 mg total) by mouth at bedtime as needed. 09/12/20   Love, Ivan Anchors, PA-C  prochlorperazine (COMPAZINE) 10 MG tablet Take 1 tablet (10 mg total) by mouth every 8 (eight) hours as needed for nausea or vomiting. 07/24/20   Love, Ivan Anchors, PA-C  triamcinolone cream (KENALOG) 0.1 % Apply topically 3 (three) times daily. 09/12/20   Bary Leriche, PA-C    Allergies:  Allergies  Allergen Reactions   Cephalosporins Hives and Swelling   Clarithromycin Other (See Comments)    Unknown reaction   Ensure [Nutritional Supplements] Nausea And Vomiting   Gabapentin    Lunesta  [Eszopiclone]     Heart racing   Nepro [Compleat] Nausea And Vomiting   Penicillin V     Has patient had a PCN reaction causing immediate rash, facial/tongue/throat swelling, SOB or lightheadedness with hypotension: Yes Has patient had a PCN reaction causing severe rash involving mucus membranes or skin necrosis: No Has patient had a PCN reaction that required hospitalization No Has patient had a PCN reaction occurring within the last 10 years: No If all of the above answers are "NO", then may proceed with Cephalosporin use.    Sulfa Antibiotics     Unknown reaction    ROS:  General: Denies weight loss, weight gain, fatigue, fevers, chills, and night sweats. Eyes: Denies blurry vision, double vision, eye pain, itchy eyes, and tearing. Ears: Denies hearing loss, earache, and ringing in ears. Nose: Denies sinus pain, congestion, infections, runny nose, and nosebleeds. Mouth/throat: Denies hoarseness, sore throat, bleeding gums, and difficulty swallowing. Heart: Denies chest pain, palpitations, racing heart, irregular heartbeat, leg pain or swelling, and decreased activity tolerance. Respiratory: Denies breathing difficulty, shortness of breath, wheezing, cough, and sputum. GI: Denies change in appetite, heartburn, nausea, vomiting, constipation, diarrhea, and blood in stool. GU: Denies difficulty urinating, pain with urinating, urgency, frequency, blood in urine. Musculoskeletal: Denies joint stiffness, pain, swelling, muscle weakness. Skin: Denies rash, itching, mass, tumors, sores, and boils Neurologic: Denies headache, fainting, dizziness, seizures, numbness, and tingling. Psychiatric: Denies depression, anxiety, difficulty sleeping, and memory loss. Endocrine: Denies heat or cold intolerance, and increased thirst or urination. Blood/lymph: Denies easy bruising, easy bruising, and swollen glands     Objective:     BP (!) 152/67   Pulse 75   Temp 98.3 F (36.8 C) (Oral)   Resp 15   Ht 5\' 4"  (1.626 m)   Wt 56.4 kg   SpO2 96%  BMI 21.34 kg/m   Constitutional :  alert, cooperative, appears stated age, and no distress  Lymphatics/Throat:  no asymmetry, masses, or scars  Respiratory:  clear to auscultation bilaterally  Cardiovascular:  regular rate and rhythm  Gastrointestinal: soft, non-tender; bowel sounds normal; no masses,  no organomegaly and PD catheter in place with no signs of infection .  Musculoskeletal: Steady gait and movement  Skin: Cool and moist  Psychiatric: Normal affect,  non-agitated, not confused       LABS:  CMP Latest Ref Rng & Units 11/10/2020 11/09/2020 11/08/2020  Glucose 70 - 99 mg/dL 91 94 76  BUN 8 - 23 mg/dL 25(H) 23 35(H)  Creatinine 0.44 - 1.00 mg/dL 2.87(H) 2.39(H) 3.38(H)  Sodium 135 - 145 mmol/L 128(L) 129(L) 125(L)  Potassium 3.5 - 5.1 mmol/L 3.1(L) 3.4(L) 4.0  Chloride 98 - 111 mmol/L 97(L) 98 98  CO2 22 - 32 mmol/L 27 28 23   Calcium 8.9 - 10.3 mg/dL 7.7(L) 7.5(L) 8.0(L)  Total Protein 6.5 - 8.1 g/dL - - -  Total Bilirubin 0.3 - 1.2 mg/dL - - -  Alkaline Phos 38 - 126 U/L - - -  AST 15 - 41 U/L - - -  ALT 0 - 44 U/L - - -   CBC Latest Ref Rng & Units 11/10/2020 11/09/2020 11/07/2020  WBC 4.0 - 10.5 K/uL 6.3 6.3 10.4  Hemoglobin 12.0 - 15.0 g/dL 8.3(L) 8.1(L) 10.1(L)  Hematocrit 36.0 - 46.0 % 25.8(L) 25.0(L) 33.2(L)  Platelets 150 - 400 K/uL 273 281 350    RADS: N/a  Assessment:      End-stage renal disease requiring hemodialysis, patient no longer is a candidate for peritoneal dialysis.  Surgery consulted for removal of pre-existing PD catheter.  Plan:     1. Alternatives include continued observation.  Benefits include prevention of PD cath complications such as peritonitis.  Discussed the risk of surgery including bleeding, post-op infxn, poor cosmesis, poor/delayed wound healing, injury to surrounding organs and possible re-operation to address said risks. The risks of general anesthetic, if used, includes MI, CVA, sudden death or even reaction to anesthetic medications also discussed.  Typical post-op recovery time of 3-5 days with possible activity restrictions were also discussed.  The patient verbalized understanding and all questions were answered to the patient's satisfaction.  Op note from PD cath placement reviewed, which indicated pexing of the catheter within the pelvis with sutures.  Therefore, with robotic assisted laparoscopic PD catheter removal, in anticipation for possible adhesions and difficulty in reaching the  PD catheter suture.

## 2020-11-10 NOTE — Progress Notes (Signed)
PROGRESS NOTE    DAWNISHA MARQUINA  TIR:[id number] DOB: 1951-02-08 DOA: 11/07/2020 PCP: Murlean Iba, MD  [phone]   Assessment & Plan:   Active Problems:   Pulmonary edema   LEAHA CUERVO is a 70 y.o. Caucasian female with medical history significant for end-stage renal disease on hemodialysis, anxiety, anemia, gastritis, GERD, multiple admissions with most recent for C. difficile colitis and non-STEMI status post PCI and recent acute on chronic SDH status post partial craniotomy and evacuation of SDH as well as hypertension, presenting to the emergency room with acute onset of nonfunctioning right chest Port-A-Cath.  She has been having worsening bilateral lower and upper extremity edema.  She has 2 pillow orthopnea and admits to dry cough without wheezing.   Pulm edema, anasarca, fluid overlaod 2/2 Missing dialysis due to inability to access dialysis cath ESRD on HD MWF --Pt missed 2 HD sessions due to PermCath clogging --nephro was able to de-clog the PermCath initially Plan: --cont IV lasix 60 mg BID  --iHD per nephrology for fluid removal --PermCath exchange with vascular surgery today  Chronic systolic CHF  - Her last EF was 25 to 30% with mild mitral regurgitation and trivial tricuspid regurgitation in February of this year. --unable to determine whether fluid overload was due to acute CHF or simply due to missing dialysis Plan: --cont IV lasix 60 mg BID  --iHD today --iHD per nephrology for fluid removal --cont Lopressor  Hyponatremia, hypervolemia --treat with volume removal  2.  Dyslipidemia. - We will continue statin therapy.  3.  Seizure disorder. - cont Vimpat and Keppra  4.  GERD. - We will continue PPI therapy.  5.  Coronary artery disease. --cont statin  Chronic headache --chronic and long-standing --Fioricet PRN  Hypomag --replete with IV mag PRN  Hypokalemia --will defer to nephrology for repletion  Anemia of chronic kidney disease:  hemoglobin 8.1 this morning --Epo with dialysis    DVT prophylaxis: Heparin SQ Code Status: Full code  Family Communication:  Level of care: Med-Surg Dispo:   The patient is from: SNF Anticipated d/c is to: SNF Anticipated d/c date is: 1-2 days Patient currently is not medically ready to d/c due to: nephro to arrange to dialysis cath placement and removal   Subjective and Interval History:  Pt received dialysis again today.  Nephro arranged for PermCath exchange today.     Objective: Vitals:   11/10/20 1230 11/10/20 1403 11/10/20 1500 11/10/20 1601  BP: (!) 151/67 (!) 151/76 (!) 150/70 (!) 152/67  Pulse:  77 82 75  Resp: 13 16 16 15   Temp:  98.7 F (37.1 C)  98.3 F (36.8 C)  TempSrc:  Oral  Oral  SpO2:  95%  96%  Weight:      Height:        Intake/Output Summary (Last 24 hours) at 11/10/2020 1638 Last data filed at 11/10/2020 1230 Gross per 24 hour  Intake 120 ml  Output 2525 ml  Net -2405 ml   Filed Weights   11/09/20 0500 11/09/20 1000 11/10/20 0336  Weight: 61.2 kg 57.4 kg 56.4 kg    Examination:   Constitutional: NAD, AAOx3 HEENT: conjunctivae and lids normal, EOMI CV: No cyanosis.   RESP: normal respiratory effort, on RA Extremities: 2+ pitting edema in BLE SKIN: warm, dry, extensive bruising Neuro: II - XII grossly intact.   Psych: Normal mood and affect.  Appropriate judgement and reason   Data Reviewed: I have personally reviewed following labs and imaging studies  CBC: Recent Labs  Lab 11/07/20 1415 11/09/20 0421 11/10/20 0400  WBC 10.4 6.3 6.3  HGB 10.1* 8.1* 8.3*  HCT 33.2* 25.0* 25.8*  MCV 89.7 84.7 84.9  PLT 350 281 161   Basic Metabolic Panel: Recent Labs  Lab 11/07/20 1415 11/08/20 0437 11/09/20 0421 11/10/20 0400  NA 122* 125* 129* 128*  K 4.3 4.0 3.4* 3.1*  CL 96* 98 98 97*  CO2 18* 23 28 27   GLUCOSE 98 76 94 91  BUN 34* 35* 23 25*  CREATININE 3.20* 3.38* 2.39* 2.87*  CALCIUM 7.8* 8.0* 7.5* 7.7*  MG  --   --  1.5*  1.9  PHOS  --   --   --  3.3   GFR: Estimated Creatinine Clearance: 15.5 mL/min (A) (by C-G formula based on SCr of 2.87 mg/dL (H)). Liver Function Tests: No results for input(s): AST, ALT, ALKPHOS, BILITOT, PROT, ALBUMIN in the last 168 hours. No results for input(s): LIPASE, AMYLASE in the last 168 hours. No results for input(s): AMMONIA in the last 168 hours. Coagulation Profile: No results for input(s): INR, PROTIME in the last 168 hours. Cardiac Enzymes: No results for input(s): CKTOTAL, CKMB, CKMBINDEX, TROPONINI in the last 168 hours. BNP (last 3 results) No results for input(s): PROBNP in the last 8760 hours. HbA1C: No results for input(s): HGBA1C in the last 72 hours. CBG: No results for input(s): GLUCAP in the last 168 hours. Lipid Profile: No results for input(s): CHOL, HDL, LDLCALC, TRIG, CHOLHDL, LDLDIRECT in the last 72 hours. Thyroid Function Tests: No results for input(s): TSH, T4TOTAL, FREET4, T3FREE, THYROIDAB in the last 72 hours. Anemia Panel: No results for input(s): VITAMINB12, FOLATE, FERRITIN, TIBC, IRON, RETICCTPCT in the last 72 hours. Sepsis Labs: No results for input(s): PROCALCITON, LATICACIDVEN in the last 168 hours.  Recent Results (from the past 240 hour(s))  Resp Panel by RT-PCR (Flu A&B, Covid) Nasopharyngeal Swab     Status: None   Collection Time: 11/07/20  4:00 PM   Specimen: Nasopharyngeal Swab; Nasopharyngeal(NP) swabs in vial transport medium  Result Value Ref Range Status   SARS Coronavirus 2 by RT PCR NEGATIVE NEGATIVE Final    Comment: (NOTE) SARS-CoV-2 target nucleic acids are NOT DETECTED.  The SARS-CoV-2 RNA is generally detectable in upper respiratory specimens during the acute phase of infection. The lowest concentration of SARS-CoV-2 viral copies this assay can detect is 138 copies/mL. A negative result does not preclude SARS-Cov-2 infection and should not be used as the sole basis for treatment or other patient management  decisions. A negative result may occur with  improper specimen collection/handling, submission of specimen other than nasopharyngeal swab, presence of viral mutation(s) within the areas targeted by this assay, and inadequate number of viral copies(<138 copies/mL). A negative result must be combined with clinical observations, patient history, and epidemiological information. The expected result is Negative.  Fact Sheet for Patients:  EntrepreneurPulse.com.au  Fact Sheet for Healthcare Providers:  IncredibleEmployment.be  This test is no t yet approved or cleared by the [place] FDA and  has been authorized for detection and/or diagnosis of SARS-CoV-2 by FDA under an Emergency Use Authorization (EUA). This EUA will remain  in effect (meaning this test can be used) for the duration of the COVID-19 declaration under Section 564(b)(1) of the Act, 21 U.S.C.section 360bbb-3(b)(1), unless the authorization is terminated  or revoked sooner.       Influenza A by PCR NEGATIVE NEGATIVE Final   Influenza B by PCR NEGATIVE NEGATIVE  Final    Comment: (NOTE) The Xpert Xpress SARS-CoV-2/FLU/RSV plus assay is intended as an aid in the diagnosis of influenza from Nasopharyngeal swab specimens and should not be used as a sole basis for treatment. Nasal washings and aspirates are unacceptable for Xpert Xpress SARS-CoV-2/FLU/RSV testing.  Fact Sheet for Patients: EntrepreneurPulse.com.au  Fact Sheet for Healthcare Providers: IncredibleEmployment.be  This test is not yet approved or cleared by the [place] FDA and has been authorized for detection and/or diagnosis of SARS-CoV-2 by FDA under an Emergency Use Authorization (EUA). This EUA will remain in effect (meaning this test can be used) for the duration of the COVID-19 declaration under Section 564(b)(1) of the Act, 21 U.S.C. section 360bbb-3(b)(1), unless the  authorization is terminated or revoked.  Performed at [facility], [address] [place], [place] [address]       Radiology Studies: PERIPHERAL VASCULAR CATHETERIZATION  Result Date: 11/10/2020 See surgical note for result.    Scheduled Meds:  aspirin  81 mg Oral Daily   atorvastatin  40 mg Oral Daily   Chlorhexidine Gluconate Cloth  6 each Topical Daily   docusate sodium  100 mg Oral BID   furosemide  60 mg Intravenous Q12H   heparin  5,000 Units Subcutaneous Q8H   lacosamide  50 mg Oral BID   levETIRAcetam  500 mg Oral QHS   [START ON 11/12/2020] levETIRAcetam  500 mg Oral Q M,W,F   melatonin  5 mg Oral QHS   metoCLOPramide  5 mg Oral QPC breakfast   metoprolol tartrate  6.25 mg Oral BID   multivitamin  1 tablet Oral QHS   pantoprazole  40 mg Oral Daily   Continuous Infusions:     LOS: 3 days     Enzo Bi, MD [facility] If 7PM-7AM, please contact night-coverage 11/10/2020, 4:38 PM

## 2020-11-10 NOTE — Progress Notes (Signed)
PT Cancellation Note  Patient Details Name: Amy [name] MRN: [id number] DOB: [birth year]   Cancelled Treatment:    Reason Eval/Treat Not Completed: Patient at procedure or test/unavailable.  PT consult received.  Chart reviewed.  Pt initially at dialysis (not available for PT evaluation) and now pt off floor for procedure (per chart pt's R tunneled catheter is thrombosed requiring intervention).  Will re-attempt PT evaluation at a later date/time.  Leitha Bleak, PT 11/10/20, 2:18 PM

## 2020-11-10 NOTE — Progress Notes (Signed)
PT Placed on telle, spoke with Amy [name] at 9:03. Pt lines reversed due to tension when pushing and pulling the cath. Bfr unable to exceed 250 due to pressures.

## 2020-11-10 NOTE — Progress Notes (Addendum)
[facility]  ROUNDING NOTE   Subjective:   Hemodialysis today.   Unable to stand for weight  Improved volume status  Patient seen during dialysis   HEMODIALYSIS FLOWSHEET:  Blood Flow Rate (mL/min): 200 mL/min Arterial Pressure (mmHg): -200 mmHg Venous Pressure (mmHg): 40 mmHg Transmembrane Pressure (mmHg): 60 mmHg Ultrafiltration Rate (mL/min): 70 mL/min Dialysate Flow Rate (mL/min): 500 ml/min Conductivity: Machine : 13.7 Conductivity: Machine : 13.7 Dialysis Fluid Bolus: Normal Saline Bolus Amount (mL): 300 mL Dialysate Change: Other (comment) (seq)    Objective:  Vital signs in last 24 hours:  Temp:  [98.1 F (36.7 C)-98.7 F (37.1 C)] 98.5 F (36.9 C) (07/11 0914) Pulse Rate:  [66-81] 81 (07/11 0930) Resp:  [6-24] 13 (07/11 1230) BP: (131-156)/(65-88) 151/67 (07/11 1230) SpO2:  [95 %-100 %] 95 % (07/11 0334) Weight:  [56.4 kg] 56.4 kg (07/11 0336)  Weight change: -3.8 kg Filed Weights   11/09/20 0500 11/09/20 1000 11/10/20 0336  Weight: 61.2 kg 57.4 kg 56.4 kg    Intake/Output: I/O last 3 completed shifts: In: 600 [P.O.:600] Out: 2025 [Urine:25; Other:2000]   Intake/Output this shift:  Total I/O In: -  Out: 2500 [Other:2500]  Physical Exam: General: NAD, laying in bed  Head: Normocephalic, atraumatic. Moist oral mucosal membranes  Eyes: Anicteric  Lungs:  Clear to auscultation, normal effort  Heart: Regular rate and rhythm  Abdomen:  Soft, nontender  Extremities:  ++ peripheral edema.  Neurologic: Nonfocal, moving all four extremities  Skin: No lesions  Access: RIJ permcath, PD catheter    Basic Metabolic Panel: Recent Labs  Lab 11/07/20 1415 11/08/20 0437 11/09/20 0421 11/10/20 0400  NA 122* 125* 129* 128*  K 4.3 4.0 3.4* 3.1*  CL 96* 98 98 97*  CO2 18* 23 28 27   GLUCOSE 98 76 94 91  BUN 34* 35* 23 25*  CREATININE 3.20* 3.38* 2.39* 2.87*  CALCIUM 7.8* 8.0* 7.5* 7.7*  MG  --   --  1.5* 1.9  PHOS  --   --   --   3.3     Liver Function Tests: No results for input(s): AST, ALT, ALKPHOS, BILITOT, PROT, ALBUMIN in the last 168 hours. No results for input(s): LIPASE, AMYLASE in the last 168 hours. No results for input(s): AMMONIA in the last 168 hours.  CBC: Recent Labs  Lab 11/07/20 1415 11/09/20 0421 11/10/20 0400  WBC 10.4 6.3 6.3  HGB 10.1* 8.1* 8.3*  HCT 33.2* 25.0* 25.8*  MCV 89.7 84.7 84.9  PLT 350 281 273     Cardiac Enzymes: No results for input(s): CKTOTAL, CKMB, CKMBINDEX, TROPONINI in the last 168 hours.  BNP: Invalid input(s): POCBNP  CBG: No results for input(s): GLUCAP in the last 168 hours.  Microbiology: Results for orders placed or performed during the hospital encounter of 11/07/20  Resp Panel by RT-PCR (Flu A&B, Covid) Nasopharyngeal Swab     Status: None   Collection Time: 11/07/20  4:00 PM   Specimen: Nasopharyngeal Swab; Nasopharyngeal(NP) swabs in vial transport medium  Result Value Ref Range Status   SARS Coronavirus 2 by RT PCR NEGATIVE NEGATIVE Final    Comment: (NOTE) SARS-CoV-2 target nucleic acids are NOT DETECTED.  The SARS-CoV-2 RNA is generally detectable in upper respiratory specimens during the acute phase of infection. The lowest concentration of SARS-CoV-2 viral copies this assay can detect is 138 copies/mL. A negative result does not preclude SARS-Cov-2 infection and should not be used as the sole basis for treatment or other  patient management decisions. A negative result may occur with  improper specimen collection/handling, submission of specimen other than nasopharyngeal swab, presence of viral mutation(s) within the areas targeted by this assay, and inadequate number of viral copies(<138 copies/mL). A negative result must be combined with clinical observations, patient history, and epidemiological information. The expected result is Negative.  Fact Sheet for Patients:  EntrepreneurPulse.com.au  Fact Sheet for  Healthcare Providers:  IncredibleEmployment.be  This test is no t yet approved or cleared by the [place] FDA and  has been authorized for detection and/or diagnosis of SARS-CoV-2 by FDA under an Emergency Use Authorization (EUA). This EUA will remain  in effect (meaning this test can be used) for the duration of the COVID-19 declaration under Section 564(b)(1) of the Act, 21 U.S.C.section 360bbb-3(b)(1), unless the authorization is terminated  or revoked sooner.       Influenza A by PCR NEGATIVE NEGATIVE Final   Influenza B by PCR NEGATIVE NEGATIVE Final    Comment: (NOTE) The Xpert Xpress SARS-CoV-2/FLU/RSV plus assay is intended as an aid in the diagnosis of influenza from Nasopharyngeal swab specimens and should not be used as a sole basis for treatment. Nasal washings and aspirates are unacceptable for Xpert Xpress SARS-CoV-2/FLU/RSV testing.  Fact Sheet for Patients: EntrepreneurPulse.com.au  Fact Sheet for Healthcare Providers: IncredibleEmployment.be  This test is not yet approved or cleared by the [place] FDA and has been authorized for detection and/or diagnosis of SARS-CoV-2 by FDA under an Emergency Use Authorization (EUA). This EUA will remain in effect (meaning this test can be used) for the duration of the COVID-19 declaration under Section 564(b)(1) of the Act, 21 U.S.C. section 360bbb-3(b)(1), unless the authorization is terminated or revoked.  Performed at [facility], [address] [place], [place] [address]     Coagulation Studies: No results for input(s): LABPROT, INR in the last 72 hours.  Urinalysis: No results for input(s): COLORURINE, LABSPEC, PHURINE, GLUCOSEU, HGBUR, BILIRUBINUR, KETONESUR, PROTEINUR, UROBILINOGEN, NITRITE, LEUKOCYTESUR in the last 72 hours.  Invalid input(s): APPERANCEUR    Imaging: No results found.   Medications:     aspirin  81 mg Oral  Daily   atorvastatin  40 mg Oral Daily   Chlorhexidine Gluconate Cloth  6 each Topical Daily   docusate sodium  100 mg Oral BID   epoetin (EPOGEN/PROCRIT) injection  10,000 Units Intravenous Q M,W,F-HD   furosemide  60 mg Intravenous Q12H   heparin  5,000 Units Subcutaneous Q8H   lacosamide  50 mg Oral BID   levETIRAcetam  500 mg Oral QHS   [START ON 11/12/2020] levETIRAcetam  500 mg Oral Q M,W,F   melatonin  5 mg Oral QHS   metoCLOPramide  5 mg Oral QPC breakfast   metoprolol tartrate  6.25 mg Oral BID   multivitamin  1 tablet Oral QHS   pantoprazole  40 mg Oral Daily   acetaminophen **OR** acetaminophen, butalbital-acetaminophen-caffeine, camphor-menthol, magnesium hydroxide, methocarbamol, ondansetron **OR** ondansetron (ZOFRAN) IV, prochlorperazine, traZODone, traZODone  Assessment/ Plan:  Amy [name] is a 70 y.o. white female with end stage renal disease on hemodialysis, hypertension, hyperlipidemia, seizure disorder, GERD, anxiety who is admitted to [facility] on 11/07/2020 for Pulmonary edema [J81.1] ESRD (end stage renal disease) (Tool) [B09.6] Complication associated with dialysis catheter [T82.9XXA]  [facility] PD - on back up hemodialysis Hemodialysis: MWF Shanon Payor RIJ permcath 53 kg  End stage renal disease: on back up hemodialysis. Currently over 10 kg above her estimated dry weight on  admission.  Bed weight this morning 57.4kg (4.4 kg above dry weight) - Receiving dialysis today, had to reverse lines,but not successful. Increased pressures. Consulted vascular for permcath exchange. Made NPO for procedure - contacted surgery to remove peritoneal catheter - Next scheduled treatment on Thursday  Hypertension: 155/67. Continue on metoprolol and furosemide.   Anemia of chronic kidney disease: hemoglobin 8.3. Aranesp given on dialysis treatment 7/9. Gets EPO with each treatment as outpatient    Secondary Hyperparathyroidism: currently not on binders.  Hyponatremia: improving  with hemodialysis. Related to volume overload.  Current level 128. Fluid restriction of 1264ml daily   LOS: 3   7/11/20221:01 PM

## 2020-11-10 NOTE — Op Note (Signed)
OPERATIVE NOTE    PRE-OPERATIVE DIAGNOSIS: 1. ESRD 2. Non-functional permcath  POST-OPERATIVE DIAGNOSIS: same as above  PROCEDURE: Fluoroscopic guidance for placement of catheter Placement of a 19 cm tip to cuff tunneled hemodialysis catheter via the right internal jugular vein and removal of previous catheter  SURGEON: Leotis Pain, MD  ANESTHESIA:  Local with moderate conscious sedation for 10 minutes using 1 mg of Versed and 50 mcg of Fentanyl  ESTIMATED BLOOD LOSS: 10 cc  FINDING(S): none  SPECIMEN(S):  None  INDICATIONS:   Patient is a 70 y.o.female who presents with non-functional dialysis catheter and ESRD.  The patient needs long term dialysis access for their ESRD, and a Permcath is necessary.  Risks and benefits are discussed and informed consent is obtained.    DESCRIPTION: After obtaining full informed written consent, the patient was brought back to the vascular suite. The patient received moderate conscious sedation during a face-to-face encounter with me present throughout the entire procedure and supervising the RN monitoring the vital signs, pulse oximetry, telemetry, and mental status throughout the entire procedure. The patient's existing catheter, right neck and chest were sterilely prepped and draped in a sterile surgical field was created.  The existing catheter was dissected free from the fibrous sheath securing the cuff with hemostats and blunt dissection.  A wire was placed. The existing catheter was then removed and the wire used to keep venous access. I selected a 19 cm tip to cuff tunneled dialysis catheter.  Using fluoroscopic guidance the catheter tips were parked in the right atrium. The appropriate distal connectors were placed. It withdrew blood well and flushed easily with heparinized saline and a concentrated heparin solution was then placed. It was secured to the chest wall with 2 Prolene sutures. A 4-0 Monocryl pursestring suture was placed around the  exit site. Sterile dressings were placed. The patient tolerated the procedure well and was taken to the recovery room in stable condition.  COMPLICATIONS: None  CONDITION: Stable  Leotis Pain 11/10/2020 3:11 PM   This note was created with Dragon Medical transcription system. Any errors in dictation are purely unintentional.

## 2020-11-10 NOTE — Progress Notes (Signed)
OT Cancellation Note  Patient Details Name: Amy [name] MRN: [id number] DOB: December 17, 1950   Cancelled Treatment:    Reason Eval/Treat Not Completed: Patient at procedure or test/ unavailable. Consult received, chart reviweed. Pt initially out of the room for dialysis. Now out of room, per vascular note, for permcath exchange. Will attempt OT evaluation at later date/time as pt is available and medically appropriate.   Hanley Hays, MPH, MS, OTR/L ascom [phone] 11/10/20, 2:17 PM

## 2020-11-10 NOTE — Progress Notes (Signed)
Dr. Lucky Cowboy at bedside, speaking with pt. Re: procedure. Pt. Verbalized understanding.

## 2020-11-10 NOTE — Consult Note (Signed)
Subjective:   CC: PD catheter removal  HPI:  Amy [name] is a 70 y.o. female who was consulted by [facility] for evaluation of above.  Patient currently unable to receive PD dialysis catheter due to multiple comorbidities and need for SNF placement.  Nephrologist assessment is patient likely will need to stay on HD dialysis for the foreseeable future, therefore surgery requested for removal of PD cath.    Past Medical History:  has a past medical history of Anemia, Anxiety, Closed fracture of shaft of left ulna (11/27/2018), Colon polyp, ESRD on peritoneal dialysis ([facility]), Gastritis, GERD (gastroesophageal reflux disease), History of chicken pox, History of measles as a child, Hypertension, Hyponatremia, Leukocytosis, Mesenteric lymphadenopathy (04/08/2017), and Multiple thyroid nodules.  Past Surgical History:  has a past surgical history that includes Cholecystectomy (2010); Gallbladder surgery (2012); Abdominal hysterectomy (2003); Breast lumpectomy (Left, 1990); Dilation and curettage of uterus (1985); Tonsillectomy and adenoidectomy (1970); Knee surgery; Upper gi endoscopy (03/02/2006); Tonsillectomy; Esophagogastroduodenoscopy (egd) with propofol (N/A, 09/07/2016); Colonoscopy with propofol (N/A, 09/07/2016); DIALYSIS/PERMA CATHETER INSERTION (N/A, 11/16/2018); DIALYSIS/PERMA CATHETER REMOVAL (N/A, 08/16/2019); Intramedullary (im) nail intertrochanteric (Right, 04/23/2020); LEFT HEART CATH AND CORONARY ANGIOGRAPHY (N/A, 06/25/2020); CORONARY STENT INTERVENTION (N/A, 06/27/2020); INTRAVASCULAR IMAGING/OCT (N/A, 06/27/2020); IR US Guide Vasc Access Right (09/04/2020); and IR Fluoro Guide CV Line Right (09/04/2020).  Family History: family history includes Cancer in her paternal grandfather; Congestive Heart Failure in her maternal grandmother; Heart disease in her maternal uncle; Pancreatic cancer in her father.  Social History:  reports that she has never smoked. She has never used smokeless tobacco. She reports  current alcohol use. She reports that she does not use drugs.  Current Medications:  Prior to Admission medications   Medication Sig Start Date End Date Taking? Authorizing Provider  acetaminophen (TYLENOL) 325 MG tablet Take 1-2 tablets (325-650 mg total) by mouth every 4 (four) hours as needed for mild pain. 09/12/20  Yes Love, Ivan Anchors, PA-C  aspirin 81 MG chewable tablet Chew 1 tablet (81 mg total) by mouth daily. 06/30/20  Yes Eulogio Bear U, DO  atorvastatin (LIPITOR) 40 MG tablet Take 1 tablet (40 mg total) by mouth daily. 09/13/20  Yes Love, Ivan Anchors, PA-C  atorvastatin (LIPITOR) 40 MG tablet Take 40 mg by mouth at bedtime. 08/04/20 08/04/21 Yes [provider]  brivaracetam (BRIVIACT) 25 MG TABS tablet Take 3 tablets (75 mg total) by mouth 2 (two) times daily. 09/12/20  Yes Love, Ivan Anchors, PA-C  butalbital-acetaminophen-caffeine (FIORICET) [phone] MG tablet Take 1 tablet by mouth 3 (three) times daily as needed for headache. 09/12/20  Yes Love, Ivan Anchors, PA-C  camphor-menthol [facility]) lotion Apply topically as needed for itching. 09/12/20  Yes Love, Ivan Anchors, PA-C  Chlorhexidine Gluconate Cloth 2 % PADS Apply 6 each topically daily. 09/13/20  Yes Love, Ivan Anchors, PA-C  Darbepoetin Alfa (ARANESP) 60 MCG/0.3ML SOSY injection Inject 0.3 mLs (60 mcg total) into the vein every Saturday with hemodialysis. 09/20/20  Yes Love, Ivan Anchors, PA-C  docusate sodium (COLACE) 100 MG capsule Take 1 capsule (100 mg total) by mouth 2 (two) times daily. 09/12/20  Yes Love, Ivan Anchors, PA-C  lacosamide (VIMPAT) 50 MG TABS tablet Take 1 tablet (50 mg total) by mouth 2 (two) times daily. 09/12/20  Yes Love, Ivan Anchors, PA-C  melatonin 5 MG TABS Take 1 tablet (5 mg total) by mouth at bedtime. 09/12/20  Yes Love, Ivan Anchors, PA-C  methocarbamol (ROBAXIN) 500 MG tablet Take 1 tablet (500 mg total) by mouth  every 6 (six) hours as needed for muscle spasms. 09/12/20  Yes Love, Ivan Anchors, PA-C  metoCLOPramide (REGLAN) 5 MG tablet  Take 1 tablet (5 mg total) by mouth daily after breakfast. 09/16/20  Yes Love, Ivan Anchors, PA-C  metoprolol tartrate (LOPRESSOR) 25 mg/10 mL SUSP Take 2.5 mLs (6.25 mg total) by mouth 2 (two) times daily. 09/12/20  Yes Love, Ivan Anchors, PA-C  multivitamin (RENA-VIT) TABS tablet Take 1 tablet by mouth at bedtime. 09/12/20  Yes Love, Ivan Anchors, PA-C  pantoprazole (PROTONIX) 40 MG tablet TAKE 1 TABLET (40 MG TOTAL) BY MOUTH DAILY. 06/30/20 06/30/21 Yes Vann, Jessica U, DO  traZODone (DESYREL) 50 MG tablet Take 0.5-1 tablets (25-50 mg total) by mouth at bedtime as needed for sleep. 09/12/20  Yes Love, Ivan Anchors, PA-C  gentamicin cream (GARAMYCIN) 0.1 % Apply 1 application topically daily. 07/24/20   Love, Ivan Anchors, PA-C  Melatonin 3 MG TBDP Take by mouth. 08/04/20   [provider]  melatonin 5 MG TABS Take 1 tablet (5 mg total) by mouth at bedtime as needed. 09/12/20   Love, Ivan Anchors, PA-C  prochlorperazine (COMPAZINE) 10 MG tablet Take 1 tablet (10 mg total) by mouth every 8 (eight) hours as needed for nausea or vomiting. 07/24/20   Love, Ivan Anchors, PA-C  triamcinolone cream (KENALOG) 0.1 % Apply topically 3 (three) times daily. 09/12/20   Bary Leriche, PA-C    Allergies:  Allergies  Allergen Reactions   Cephalosporins Hives and Swelling   Clarithromycin Other (See Comments)    Unknown reaction   Ensure [Nutritional Supplements] Nausea And Vomiting   Gabapentin    Lunesta  [Eszopiclone]     Heart racing   Nepro [Compleat] Nausea And Vomiting   Penicillin V     Has patient had a PCN reaction causing immediate rash, facial/tongue/throat swelling, SOB or lightheadedness with hypotension: Yes Has patient had a PCN reaction causing severe rash involving mucus membranes or skin necrosis: No Has patient had a PCN reaction that required hospitalization No Has patient had a PCN reaction occurring within the last 10 years: No If all of the above answers are "NO", then may proceed with Cephalosporin use.    Sulfa Antibiotics     Unknown reaction    ROS:  General: Denies weight loss, weight gain, fatigue, fevers, chills, and night sweats. Eyes: Denies blurry vision, double vision, eye pain, itchy eyes, and tearing. Ears: Denies hearing loss, earache, and ringing in ears. Nose: Denies sinus pain, congestion, infections, runny nose, and nosebleeds. Mouth/throat: Denies hoarseness, sore throat, bleeding gums, and difficulty swallowing. Heart: Denies chest pain, palpitations, racing heart, irregular heartbeat, leg pain or swelling, and decreased activity tolerance. Respiratory: Denies breathing difficulty, shortness of breath, wheezing, cough, and sputum. GI: Denies change in appetite, heartburn, nausea, vomiting, constipation, diarrhea, and blood in stool. GU: Denies difficulty urinating, pain with urinating, urgency, frequency, blood in urine. Musculoskeletal: Denies joint stiffness, pain, swelling, muscle weakness. Skin: Denies rash, itching, mass, tumors, sores, and boils Neurologic: Denies headache, fainting, dizziness, seizures, numbness, and tingling. Psychiatric: Denies depression, anxiety, difficulty sleeping, and memory loss. Endocrine: Denies heat or cold intolerance, and increased thirst or urination. Blood/lymph: Denies easy bruising, easy bruising, and swollen glands     Objective:     BP (!) 152/67   Pulse 75   Temp 98.3 F (36.8 C) (Oral)   Resp 15   Ht 5\' 4"  (1.626 m)   Wt 56.4 kg   SpO2 96%  BMI 21.34 kg/m   Constitutional :  alert, cooperative, appears stated age, and no distress  Lymphatics/Throat:  no asymmetry, masses, or scars  Respiratory:  clear to auscultation bilaterally  Cardiovascular:  regular rate and rhythm  Gastrointestinal: soft, non-tender; bowel sounds normal; no masses,  no organomegaly and PD catheter in place with no signs of infection .  Musculoskeletal: Steady gait and movement  Skin: Cool and moist  Psychiatric: Normal affect,  non-agitated, not confused       LABS:  CMP Latest Ref Rng & Units 11/10/2020 11/09/2020 11/08/2020  Glucose 70 - 99 mg/dL 91 94 76  BUN 8 - 23 mg/dL 25(H) 23 35(H)  Creatinine 0.44 - 1.00 mg/dL 2.87(H) 2.39(H) 3.38(H)  Sodium 135 - 145 mmol/L 128(L) 129(L) 125(L)  Potassium 3.5 - 5.1 mmol/L 3.1(L) 3.4(L) 4.0  Chloride 98 - 111 mmol/L 97(L) 98 98  CO2 22 - 32 mmol/L 27 28 23   Calcium 8.9 - 10.3 mg/dL 7.7(L) 7.5(L) 8.0(L)  Total Protein 6.5 - 8.1 g/dL - - -  Total Bilirubin 0.3 - 1.2 mg/dL - - -  Alkaline Phos 38 - 126 U/L - - -  AST 15 - 41 U/L - - -  ALT 0 - 44 U/L - - -   CBC Latest Ref Rng & Units 11/10/2020 11/09/2020 11/07/2020  WBC 4.0 - 10.5 K/uL 6.3 6.3 10.4  Hemoglobin 12.0 - 15.0 g/dL 8.3(L) 8.1(L) 10.1(L)  Hematocrit 36.0 - 46.0 % 25.8(L) 25.0(L) 33.2(L)  Platelets 150 - 400 K/uL 273 281 350    RADS: N/a  Assessment:      End-stage renal disease requiring hemodialysis, patient no longer is a candidate for peritoneal dialysis.  Surgery consulted for removal of pre-existing PD catheter.  Plan:     1. Alternatives include continued observation.  Benefits include prevention of PD cath complications such as peritonitis.  Discussed the risk of surgery including bleeding, post-op infxn, poor cosmesis, poor/delayed wound healing, injury to surrounding organs and possible re-operation to address said risks. The risks of general anesthetic, if used, includes MI, CVA, sudden death or even reaction to anesthetic medications also discussed.  Typical post-op recovery time of 3-5 days with possible activity restrictions were also discussed.  The patient verbalized understanding and all questions were answered to the patient's satisfaction.  Op note from PD cath placement reviewed, which indicated pexing of the catheter within the pelvis with sutures.  Therefore, with robotic assisted laparoscopic PD catheter removal, in anticipation for possible adhesions and difficulty in reaching the  PD catheter suture.

## 2020-11-10 NOTE — Progress Notes (Signed)
Pt tolerated tx well, ran at a BFR of 250 with lines reversed. Pt final bp 151/67 HR 78.

## 2020-11-11 ENCOUNTER — Inpatient Hospital Stay: Payer: Medicare Other | Admitting: Anesthesiology

## 2020-11-11 ENCOUNTER — Encounter: Payer: Self-pay | Admitting: Vascular Surgery

## 2020-11-11 ENCOUNTER — Encounter
Admission: EM | Disposition: A | Discharge status: Skilled Nursing Facility | Payer: Medicare Other | Source: Home / Self Care | Attending: Hospitalist

## 2020-11-11 DIAGNOSIS — J810 Acute pulmonary edema: Secondary | ICD-10-CM | POA: Diagnosis not present

## 2020-11-11 LAB — BASIC METABOLIC PANEL
Anion gap: 5 (ref 5–15)
BUN: 17 mg/dL (ref 8–23)
CO2: 28 mmol/L (ref 22–32)
Calcium: 8 mg/dL — ABNORMAL LOW (ref 8.9–10.3)
Chloride: 97 mmol/L — ABNORMAL LOW (ref 98–111)
Creatinine, Ser: 2.32 mg/dL — ABNORMAL HIGH (ref 0.44–1.00)
GFR, Estimated: 22 mL/min — ABNORMAL LOW (ref >60)
Glucose, Bld: 90 mg/dL (ref 70–99)
Potassium: 3.1 mmol/L — ABNORMAL LOW (ref 3.5–5.1)
Sodium: 130 mmol/L — ABNORMAL LOW (ref 135–145)

## 2020-11-11 LAB — CBC
HCT: 26.6 % — ABNORMAL LOW (ref 36.0–46.0)
Hemoglobin: 8.6 g/dL — ABNORMAL LOW (ref 12.0–15.0)
MCH: 27.1 pg (ref 26.0–34.0)
MCHC: 32.3 g/dL (ref 30.0–36.0)
MCV: 83.9 fL (ref 80.0–100.0)
Platelets: 253 10*3/uL (ref 150–400)
RBC: 3.17 MIL/uL — ABNORMAL LOW (ref 3.87–5.11)
RDW: 15.3 % (ref 11.5–15.5)
WBC: 5.6 10*3/uL (ref 4.0–10.5)
nRBC: 0 % (ref 0.0–0.2)

## 2020-11-11 LAB — MAGNESIUM: Magnesium: 1.7 mg/dL (ref 1.7–2.4)

## 2020-11-11 SURGERY — INSERTION, GASTROSTOMY TUBE, ROBOT-ASSISTED
Anesthesia: General | Site: Abdomen

## 2020-11-11 MED ORDER — OXYCODONE HCL 5 MG/5ML PO SOLN: 5.0000 mg | Freq: Once | ORAL | Status: AC | PRN

## 2020-11-11 MED ORDER — POTASSIUM CHLORIDE CRYS ER 20 MEQ PO TBCR
40.0000 meq | EXTENDED_RELEASE_TABLET | ORAL | Status: AC
  Administered 2020-11-11: 40 meq via ORAL
  Filled 2020-11-11: qty 2

## 2020-11-11 MED ORDER — FENTANYL CITRATE (PF) 100 MCG/2ML IJ SOLN
INTRAMUSCULAR | Status: DC | PRN
  Administered 2020-11-11: 50 ug via INTRAVENOUS

## 2020-11-11 MED ORDER — OXYCODONE HCL 5 MG PO TABS
5.0000 mg | ORAL_TABLET | Freq: Once | ORAL | Status: AC | PRN
Start: 2020-11-11 — End: 2020-11-11
  Administered 2020-11-11: 5 mg via ORAL

## 2020-11-11 MED ORDER — ONDANSETRON HCL 4 MG/2ML IJ SOLN
INTRAMUSCULAR | Status: DC | PRN
  Administered 2020-11-11: 4 mg via INTRAVENOUS

## 2020-11-11 MED ORDER — POTASSIUM CHLORIDE 10 MEQ/100ML IV SOLN
10.0000 meq | INTRAVENOUS | Status: DC
  Administered 2020-11-11: 10 meq via INTRAVENOUS
  Filled 2020-11-11 (×2): qty 100

## 2020-11-11 MED ORDER — PROPOFOL 10 MG/ML IV BOLUS
INTRAVENOUS | Status: DC | PRN
  Administered 2020-11-11: 100 mg via INTRAVENOUS

## 2020-11-11 MED ORDER — ENOXAPARIN SODIUM 40 MG/0.4ML IJ SOSY: 40.0000 mg | PREFILLED_SYRINGE | INTRAMUSCULAR | Status: DC

## 2020-11-11 MED ORDER — FENTANYL CITRATE (PF) 100 MCG/2ML IJ SOLN
INTRAMUSCULAR | Status: AC
  Filled 2020-11-11: qty 2

## 2020-11-11 MED ORDER — BUPIVACAINE-EPINEPHRINE 0.25% -1:200000 IJ SOLN
INTRAMUSCULAR | Status: DC | PRN
  Administered 2020-11-11: 5 mL

## 2020-11-11 MED ORDER — PHENYLEPHRINE HCL (PRESSORS) 10 MG/ML IV SOLN
INTRAVENOUS | Status: DC | PRN
  Administered 2020-11-11: 100 ug via INTRAVENOUS

## 2020-11-11 MED ORDER — SUGAMMADEX SODIUM 200 MG/2ML IV SOLN
INTRAVENOUS | Status: DC | PRN
  Administered 2020-11-11: 200 mg via INTRAVENOUS

## 2020-11-11 MED ORDER — ROCURONIUM BROMIDE 100 MG/10ML IV SOLN
INTRAVENOUS | Status: DC | PRN
  Administered 2020-11-11: 40 mg via INTRAVENOUS

## 2020-11-11 MED ORDER — FENTANYL CITRATE (PF) 100 MCG/2ML IJ SOLN
25.0000 ug | INTRAMUSCULAR | Status: DC | PRN
  Administered 2020-11-11 (×2): 25 ug via INTRAVENOUS
  Administered 2020-11-11: 50 ug via INTRAVENOUS

## 2020-11-11 MED ORDER — BUPIVACAINE LIPOSOME 1.3 % IJ SUSP
INTRAMUSCULAR | Status: AC
  Filled 2020-11-11: qty 20

## 2020-11-11 MED ORDER — LIDOCAINE HCL (CARDIAC) PF 100 MG/5ML IV SOSY
PREFILLED_SYRINGE | INTRAVENOUS | Status: DC | PRN
  Administered 2020-11-11: 50 mg via INTRAVENOUS

## 2020-11-11 MED ORDER — CLINDAMYCIN PHOSPHATE 300 MG/50ML IV SOLN
INTRAVENOUS | Status: AC
  Filled 2020-11-11: qty 50

## 2020-11-11 MED ORDER — CLINDAMYCIN PHOSPHATE 300 MG/50ML IV SOLN
INTRAVENOUS | Status: DC | PRN
  Administered 2020-11-11: 300 mg via INTRAVENOUS

## 2020-11-11 MED ORDER — LACTATED RINGERS IV SOLN: INTRAVENOUS | Status: DC | PRN

## 2020-11-11 MED ORDER — ENOXAPARIN SODIUM 30 MG/0.3ML IJ SOSY: 30.0000 mg | PREFILLED_SYRINGE | INTRAMUSCULAR | Status: DC

## 2020-11-11 MED ORDER — MEPERIDINE HCL 25 MG/ML IJ SOLN: 6.2500 mg | INTRAMUSCULAR | Status: DC | PRN

## 2020-11-11 MED ORDER — OXYCODONE HCL 5 MG PO TABS
ORAL_TABLET | ORAL | Status: AC
  Filled 2020-11-11: qty 1

## 2020-11-11 MED ORDER — BUPIVACAINE-EPINEPHRINE (PF) 0.25% -1:200000 IJ SOLN
INTRAMUSCULAR | Status: AC
  Filled 2020-11-11: qty 30

## 2020-11-11 MED ORDER — DEXAMETHASONE SODIUM PHOSPHATE 10 MG/ML IJ SOLN
INTRAMUSCULAR | Status: DC | PRN
  Administered 2020-11-11: 5 mg via INTRAVENOUS

## 2020-11-11 SURGICAL SUPPLY — 64 items
"PENCIL ELECTRO HAND CTR " (MISCELLANEOUS) ×1 IMPLANT
CANISTER SUCT 1200ML W/VALVE (MISCELLANEOUS) ×1 IMPLANT
CANNULA REDUC XI 12-8 STAPL (CANNULA)
CANNULA REDUCER 12-8 DVNC XI (CANNULA) ×1 IMPLANT
CHLORAPREP W/TINT 26 (MISCELLANEOUS) ×2 IMPLANT
COVER TIP SHEARS 8 DVNC (MISCELLANEOUS) ×1 IMPLANT
COVER TIP SHEARS 8MM DA VINCI (MISCELLANEOUS) ×2
DEFOGGER SCOPE WARMER CLEARIFY (MISCELLANEOUS) ×2 IMPLANT
DERMABOND ADVANCED (GAUZE/BANDAGES/DRESSINGS) ×1
DERMABOND ADVANCED .7 DNX12 (GAUZE/BANDAGES/DRESSINGS) ×1 IMPLANT
DRAPE ARM DVNC X/XI (DISPOSABLE) ×3 IMPLANT
DRAPE COLUMN DVNC XI (DISPOSABLE) ×1 IMPLANT
DRAPE DA VINCI XI ARM (DISPOSABLE)
DRAPE DA VINCI XI COLUMN (DISPOSABLE)
ELECT CAUTERY BLADE 6.4 (BLADE) ×3 IMPLANT
ELECT REM PT RETURN 9FT ADLT (ELECTROSURGICAL) ×2
ELECTRODE REM PT RTRN 9FT ADLT (ELECTROSURGICAL) ×1 IMPLANT
GAUZE 4X4 16PLY ~~LOC~~+RFID DBL (SPONGE) ×2 IMPLANT
GLOVE SURG ENC MOIS LTX SZ7 (GLOVE) ×4 IMPLANT
GOWN STRL REUS W/ TWL LRG LVL3 (GOWN DISPOSABLE) ×3 IMPLANT
GOWN STRL REUS W/TWL LRG LVL3 (GOWN DISPOSABLE) ×6
IRRIGATION STRYKERFLOW (MISCELLANEOUS) IMPLANT
IRRIGATOR STRYKERFLOW (MISCELLANEOUS)
KIT PINK PAD W/HEAD ARE REST (MISCELLANEOUS) ×2 IMPLANT
KIT PINK PAD W/HEAD ARM REST (MISCELLANEOUS) ×1 IMPLANT
LABEL OR SOLS (LABEL) ×2 IMPLANT
MANIFOLD NEPTUNE II (INSTRUMENTS) ×2 IMPLANT
NDL INSUFFLATION 14GA 120MM (NEEDLE) ×1 IMPLANT
NEEDLE HYPO 22GX1.5 SAFETY (NEEDLE) ×2 IMPLANT
NEEDLE INSUFFLATION 14GA 120MM (NEEDLE) ×2 IMPLANT
OBTURATOR OPTICAL STANDARD 8MM (TROCAR) ×2
OBTURATOR OPTICAL STND 8 DVNC (TROCAR) ×1
OBTURATOR OPTICALSTD 8 DVNC (TROCAR) ×1 IMPLANT
PACK LAP CHOLECYSTECTOMY (MISCELLANEOUS) ×2 IMPLANT
PENCIL ELECTRO HAND CTR (MISCELLANEOUS) ×2 IMPLANT
SCISSORS METZENBAUM CVD 33 (INSTRUMENTS) ×1 IMPLANT
SEAL CANN UNIV 5-8 DVNC XI (MISCELLANEOUS) ×3 IMPLANT
SEAL XI 5MM-8MM UNIVERSAL (MISCELLANEOUS) ×4
SEALER VESSEL DA VINCI XI (MISCELLANEOUS)
SEALER VESSEL EXT DVNC XI (MISCELLANEOUS) IMPLANT
SET TUBE SMOKE EVAC HIGH FLOW (TUBING) ×2 IMPLANT
SOLUTION ELECTROLUBE (MISCELLANEOUS) ×2 IMPLANT
SPONGE DRAIN TRACH 4X4 STRL 2S (GAUZE/BANDAGES/DRESSINGS) ×2 IMPLANT
SPONGE T-LAP 18X18 ~~LOC~~+RFID (SPONGE) ×2 IMPLANT
SPONGE T-LAP 4X18 ~~LOC~~+RFID (SPONGE) ×1 IMPLANT
STAPLER CANNULA SEAL DVNC XI (STAPLE) ×1 IMPLANT
STAPLER CANNULA SEAL XI (STAPLE)
SUT ETHILON 3-0 FS-10 30 BLK (SUTURE)
SUT MNCRL 4-0 (SUTURE) ×2
SUT MNCRL 4-0 27XMFL (SUTURE) ×1
SUT V-LOC 90 ABS 3-0 VLT  V-20 (SUTURE)
SUT V-LOC 90 ABS 3-0 VLT V-20 (SUTURE) ×1 IMPLANT
SUT VICRYL 0 AB UR-6 (SUTURE) ×3 IMPLANT
SUT VLOC 180 2-0 9IN GS21 (SUTURE) ×1 IMPLANT
SUT VLOC 90 S/L VL9 GS22 (SUTURE) ×1 IMPLANT
SUTURE EHLN 3-0 FS-10 30 BLK (SUTURE) ×1 IMPLANT
SUTURE MNCRL 4-0 27XMF (SUTURE) ×1 IMPLANT
SYR 20ML LL LF (SYRINGE) ×2 IMPLANT
SYR 30ML LL (SYRINGE) ×2 IMPLANT
TROCAR BALLN GELPORT 12X130M (ENDOMECHANICALS) ×1 IMPLANT
TUBE GASTRO 14F 5C (TUBING) IMPLANT
TUBE JEJUNO 16X14 (TUBING) IMPLANT
TUBE JEJUNO 18X14 (TUBING) IMPLANT
TUBE JEJUNO 22X14 (TUBING) IMPLANT

## 2020-11-11 NOTE — Progress Notes (Signed)
PROGRESS NOTE    Amy [name]  JEH:[id number] DOB: 1950/07/09 DOA: 11/07/2020 PCP: Murlean Iba, MD  ARPO/None   Assessment & Plan:   Active Problems:   Pulmonary edema   Amy [name] is a 70 y.o. Caucasian female with medical history significant for end-stage renal disease on hemodialysis, anxiety, anemia, gastritis, GERD, multiple admissions with most recent for C. difficile colitis and non-STEMI status post PCI and recent acute on chronic SDH status post partial craniotomy and evacuation of SDH as well as hypertension, presenting to the emergency room with acute onset of nonfunctioning right chest Port-A-Cath.  She has been having worsening bilateral lower and upper extremity edema.  She has 2 pillow orthopnea and admits to dry cough without wheezing.   Pulm edema, anasarca, fluid overlaod 2/2 Missing dialysis due to inability to access dialysis cath ESRD on HD MWF --Pt missed 2 HD sessions due to PermCath clogging --nephro was able to de-clog the PermCath initially, however, later pt did need a PermCath exchange with vascular surgery. Plan: --cont IV lasix 60 mg BID  --iHD per nephrology for fluid removal --PD cath removal by GenSurg today  Chronic systolic CHF  - Her last EF was 25 to 30% with mild mitral regurgitation and trivial tricuspid regurgitation in February of this year. --unable to determine whether fluid overload was due to acute CHF or simply due to missing dialysis Plan: --cont IV lasix 60 mg BID  --iHD per nephrology for fluid removal --cont Lopressor  Hyponatremia, hypervolemia --treat with volume removal  2.  Dyslipidemia. - We will continue statin therapy.  3.  Seizure disorder. - cont Vimpat and Keppra  4.  GERD. - We will continue PPI therapy.  5.  Coronary artery disease. --cont statin  Chronic headache --chronic and long-standing --Fioricet PRN  Hypomag --replete with IV mag PRN  Hypokalemia --40 mEq today prior to  surgery  Anemia of chronic kidney disease: hemoglobin 8.1 this morning --Epo with dialysis    DVT prophylaxis: Heparin SQ Code Status: Full code  Family Communication:  Level of care: Med-Surg Dispo:   The patient is from: SNF Anticipated d/c is to: SNF Anticipated d/c date is: tomorrow Patient currently is not medically ready to d/c due to: need nephrology clearance for discharge.   Subjective and Interval History:  No new issues.  Pt just reported being starved waiting for her PD cath removal this afternoon.  No dyspnea.     Objective: Vitals:   11/10/20 2059 11/11/20 0419 11/11/20 0838 11/11/20 1428  BP: (!) 143/69 (!) 155/72 (!) 150/74 (!) 152/75  Pulse: 74 76 68 71  Resp: 18 18 14 18   Temp: 98.4 F (36.9 C) 98.4 F (36.9 C) 98 F (36.7 C) 98.5 F (36.9 C)  TempSrc: Oral Oral Oral Oral  SpO2: 97% 98% 95% 95%  Weight:      Height:        Intake/Output Summary (Last 24 hours) at 11/11/2020 1654 Last data filed at 11/11/2020 1641 Gross per 24 hour  Intake 50 ml  Output 70 ml  Net -20 ml   Filed Weights   11/09/20 0500 11/09/20 1000 11/10/20 0336  Weight: 61.2 kg 57.4 kg 56.4 kg    Examination:   Constitutional: NAD, AAOx3 HEENT: conjunctivae and lids normal, EOMI CV: No cyanosis.   RESP: normal respiratory effort, on RA Extremities: Pitting edema in BLE, improved SKIN: warm, dry Neuro: II - XII grossly intact.   Psych: Normal mood and affect.  Appropriate  judgement and reason   Data Reviewed: I have personally reviewed following labs and imaging studies  CBC: Recent Labs  Lab 11/07/20 1415 11/09/20 0421 11/10/20 0400 11/11/20 0510  WBC 10.4 6.3 6.3 5.6  HGB 10.1* 8.1* 8.3* 8.6*  HCT 33.2* 25.0* 25.8* 26.6*  MCV 89.7 84.7 84.9 83.9  PLT 350 281 273 867   Basic Metabolic Panel: Recent Labs  Lab 11/07/20 1415 11/08/20 0437 11/09/20 0421 11/10/20 0400 11/11/20 0510  NA 122* 125* 129* 128* 130*  K 4.3 4.0 3.4* 3.1* 3.1*  CL 96* 98 98  97* 97*  CO2 18* 23 28 27 28   GLUCOSE 98 76 94 91 90  BUN 34* 35* 23 25* 17  CREATININE 3.20* 3.38* 2.39* 2.87* 2.32*  CALCIUM 7.8* 8.0* 7.5* 7.7* 8.0*  MG  --   --  1.5* 1.9 1.7  PHOS  --   --   --  3.3  --    GFR: Estimated Creatinine Clearance: 19.2 mL/min (A) (by C-G formula based on SCr of 2.32 mg/dL (H)). Liver Function Tests: No results for input(s): AST, ALT, ALKPHOS, BILITOT, PROT, ALBUMIN in the last 168 hours. No results for input(s): LIPASE, AMYLASE in the last 168 hours. No results for input(s): AMMONIA in the last 168 hours. Coagulation Profile: No results for input(s): INR, PROTIME in the last 168 hours. Cardiac Enzymes: No results for input(s): CKTOTAL, CKMB, CKMBINDEX, TROPONINI in the last 168 hours. BNP (last 3 results) No results for input(s): PROBNP in the last 8760 hours. HbA1C: No results for input(s): HGBA1C in the last 72 hours. CBG: No results for input(s): GLUCAP in the last 168 hours. Lipid Profile: No results for input(s): CHOL, HDL, LDLCALC, TRIG, CHOLHDL, LDLDIRECT in the last 72 hours. Thyroid Function Tests: No results for input(s): TSH, T4TOTAL, FREET4, T3FREE, THYROIDAB in the last 72 hours. Anemia Panel: No results for input(s): VITAMINB12, FOLATE, FERRITIN, TIBC, IRON, RETICCTPCT in the last 72 hours. Sepsis Labs: No results for input(s): PROCALCITON, LATICACIDVEN in the last 168 hours.  Recent Results (from the past 240 hour(s))  Resp Panel by RT-PCR (Flu A&B, Covid) Nasopharyngeal Swab     Status: None   Collection Time: 11/07/20  4:00 PM   Specimen: Nasopharyngeal Swab; Nasopharyngeal(NP) swabs in vial transport medium  Result Value Ref Range Status   SARS Coronavirus 2 by RT PCR NEGATIVE NEGATIVE Final    Comment: (NOTE) SARS-CoV-2 target nucleic acids are NOT DETECTED.  The SARS-CoV-2 RNA is generally detectable in upper respiratory specimens during the acute phase of infection. The lowest concentration of SARS-CoV-2 viral copies  this assay can detect is 138 copies/mL. A negative result does not preclude SARS-Cov-2 infection and should not be used as the sole basis for treatment or other patient management decisions. A negative result may occur with  improper specimen collection/handling, submission of specimen other than nasopharyngeal swab, presence of viral mutation(s) within the areas targeted by this assay, and inadequate number of viral copies(<138 copies/mL). A negative result must be combined with clinical observations, patient history, and epidemiological information. The expected result is Negative.  Fact Sheet for Patients:  EntrepreneurPulse.com.au  Fact Sheet for Healthcare Providers:  IncredibleEmployment.be  This test is no t yet approved or cleared by the [place] FDA and  has been authorized for detection and/or diagnosis of SARS-CoV-2 by FDA under an Emergency Use Authorization (EUA). This EUA will remain  in effect (meaning this test can be used) for the duration of the COVID-19 declaration under  Section 564(b)(1) of the Act, 21 U.S.C.section 360bbb-3(b)(1), unless the authorization is terminated  or revoked sooner.       Influenza A by PCR NEGATIVE NEGATIVE Final   Influenza B by PCR NEGATIVE NEGATIVE Final    Comment: (NOTE) The Xpert Xpress SARS-CoV-2/FLU/RSV plus assay is intended as an aid in the diagnosis of influenza from Nasopharyngeal swab specimens and should not be used as a sole basis for treatment. Nasal washings and aspirates are unacceptable for Xpert Xpress SARS-CoV-2/FLU/RSV testing.  Fact Sheet for Patients: EntrepreneurPulse.com.au  Fact Sheet for Healthcare Providers: IncredibleEmployment.be  This test is not yet approved or cleared by the [place] FDA and has been authorized for detection and/or diagnosis of SARS-CoV-2 by FDA under an Emergency Use Authorization (EUA). This EUA  will remain in effect (meaning this test can be used) for the duration of the COVID-19 declaration under Section 564(b)(1) of the Act, 21 U.S.C. section 360bbb-3(b)(1), unless the authorization is terminated or revoked.  Performed at [facility], [address] [place], [place] [address]       Radiology Studies: PERIPHERAL VASCULAR CATHETERIZATION  Result Date: 11/10/2020 See surgical note for result.    Scheduled Meds:  [MAR Hold] atorvastatin  40 mg Oral Daily   [MAR Hold] Chlorhexidine Gluconate Cloth  6 each Topical Daily   [MAR Hold] docusate sodium  100 mg Oral BID   [MAR Hold] furosemide  60 mg Intravenous Q12H   [MAR Hold] lacosamide  50 mg Oral BID   [MAR Hold] levETIRAcetam  500 mg Oral QHS   [MAR Hold] levETIRAcetam  500 mg Oral Q M,W,F   [MAR Hold] melatonin  5 mg Oral QHS   [MAR Hold] metoCLOPramide  5 mg Oral QPC breakfast   [MAR Hold] metoprolol tartrate  6.25 mg Oral BID   [MAR Hold] multivitamin  1 tablet Oral QHS   [MAR Hold] pantoprazole  40 mg Oral Daily   Continuous Infusions:     LOS: 4 days     Enzo Bi, MD [facility] If 7PM-7AM, please contact night-coverage 11/11/2020, 4:54 PM

## 2020-11-11 NOTE — Progress Notes (Signed)
Patient reports feeling pressure and urgency to urinate. She calls out frequently for bed pan due to urgency but produces minimal urine each time.

## 2020-11-11 NOTE — Evaluation (Signed)
Occupational Therapy Evaluation Patient Details Name: Amy [name] MRN: [id number] DOB: 05-06-1950 Today's Date: 11/11/2020    History of Present Illness Pt is a 70 y.o. female presenting to hospital 7/8 d/t concerns her R chest Port-A-Cath is not functioning (worsening B UE/LE edema).  Pt admitted with acute on chronic systolic CHF with fluid overload, pulmonary edema, and anasarca.  Permcath exchange 7/11.  PMH includes ESRD on HD MWF, chronic hyponatremia, chronic HA's, multiple admissions (most recent C-diff complicated by NSTEMI s/p PCI with DAPT), recent acute on chronic SDH s/p partial craniotomy for evacuation of SDH, anemia, L ulna shaft fx, gastroparesis, orthostasis, CHF, seizures, R hip fx, R IMN 04/2020, and knee surgery.   Clinical Impression   Pt was seen for OT evaluation this date. Prior to hospital admission, pt was recently at rehab getting stronger and able to ambulate with RW and assist. Pt lives with her spouse and is eager to work on improving her strength and endurance in order to maximize her return to her PLOF. Currently pt demonstrates impairments as described below (See OT problem list) which functionally limit her ability to perform ADL/self-care tasks. Pt currently requires MOD A for bed mobility, MOD A for lateral scoots EOB + VC for hand/foot placement. Pt performed seated groom tasks EOB with fair static sitting balance, set up, and supervision for safety. Pt endorsed 4/10 dizziness after lateral scoots that resolved once returned to supine. Pt required MIN A for pericare/hygiene after bed level toileting with bed pan and log rolling for linens change and RN replaced sacral foam pad. Pt would benefit from skilled OT services to address noted impairments and functional limitations (see below for any additional details) in order to maximize safety and independence while minimizing falls risk and caregiver burden. Upon hospital discharge, recommend STR to maximize pt safety  and return to PLOF.      Follow Up Recommendations  SNF    Equipment Recommendations  3 in 1 bedside commode    Recommendations for Other Services       Precautions / Restrictions Precautions Precautions: Fall Precaution Comments: R HD permcath; peritoneal dialysis Restrictions Weight Bearing Restrictions: No      Mobility Bed Mobility Overal bed mobility: Needs Assistance Bed Mobility: Supine to Sit;Sit to Supine;Rolling Rolling: Min assist   Supine to sit: Mod assist;HOB elevated Sit to supine: Mod assist;HOB elevated   General bed mobility comments: assist for trunk support and BLE back to bed    Transfers Overall transfer level: Needs assistance Equipment used: None Transfers: Lateral/Scoot Transfers         Lateral/Scoot Transfers: Mod assist General transfer comment: MOD A + VC for hand placement adn foot placement to support lateral scoots EOB for improved positioning once returned to supine, pt performed x3    Balance Overall balance assessment: Needs assistance Sitting-balance support: No upper extremity supported;Feet supported Sitting balance-Leahy Scale: Good Sitting balance - Comments: steady sitting reaching within BOS                                ADL either performed or assessed with clinical judgement   ADL Overall ADL's : Needs assistance/impaired     Grooming: Wash/dry hands;Wash/dry face;Oral care;Sitting;Set up;Supervision/safety       Lower Body Bathing: Bed level;Minimal assistance   Upper Body Dressing : Bed level;Minimal assistance           Toileting- Clothing Manipulation and Hygiene:  Sitting/lateral lean;Set up;Bed level;Minimal assistance Toileting - Clothing Manipulation Details (indicate cue type and reason): Pt able to perform hygiene in sitting accessing from the front, MIN A in sidelying to perform pericare             Vision         Perception     Praxis      Pertinent Vitals/Pain  Pain Assessment: No/denies pain Pain Score: 7  Pain Location: Headache Pain Descriptors / Indicators: Headache Pain Intervention(s): Limited activity within patient's tolerance;Monitored during session;Premedicated before session;Repositioned     Hand Dominance Right   Extremity/Trunk Assessment Upper Extremity Assessment Upper Extremity Assessment: Generalized weakness   Lower Extremity Assessment Lower Extremity Assessment: Generalized weakness   Cervical / Trunk Assessment Cervical / Trunk Assessment: Normal   Communication Communication Communication: No difficulties   Cognition Arousal/Alertness: Awake/alert Behavior During Therapy: WFL for tasks assessed/performed Overall Cognitive Status: Within Functional Limits for tasks assessed                                     General Comments  pt's skin cleansed after chuck pad wet from urine prior to OT's arrival, RN replaced foam sacral pad    Exercises Other Exercises Other Exercises: Pt educated in lateral scoots EOB, positioning for improved comfort and pressure woudn prevention   Shoulder Instructions      Home Living Family/patient expects to be discharged to:: Private residence Living Arrangements: Spouse/significant other Available Help at Discharge: Family Type of Home: House Home Access: Stairs to enter Technical brewer of Steps: 2 Entrance Stairs-Rails: None Home Layout: Two level;Able to live on main level with bedroom/bathroom     Bathroom Shower/Tub: Occupational psychologist: Standard     Home Equipment: Environmental consultant - 2 wheels;Bedside commode          Prior Functioning/Environment Level of Independence: Needs assistance  Gait / Transfers Assistance Needed: Pt has been using her RW since hip fx in December 2021 ADL's / Homemaking Assistance Needed: Spouse assisting with ADL's   Comments: No recent falls        OT Problem List: Decreased strength;Decreased activity  tolerance;Impaired balance (sitting and/or standing);Decreased knowledge of use of DME or AE      OT Treatment/Interventions: Self-care/ADL training;Therapeutic exercise;Therapeutic activities;DME and/or AE instruction;Energy conservation;Patient/family education;Balance training    OT Goals(Current goals can be found in the care plan section) Acute Rehab OT Goals Patient Stated Goal: to improve strength and mobility OT Goal Formulation: With patient Time For Goal Achievement: 11/25/20 Potential to Achieve Goals: Good ADL Goals Pt Will Transfer to Toilet: stand pivot transfer;with mod assist;bedside commode Additional ADL Goal #1: Pt will perform seated sponge bath with set up and PRN MIN A for LB bathing Additional ADL Goal #2: Pt will verbalize plan to implement at least 1 learned energy conservation strategy to support independence and safety with ADL  OT Frequency: Min 2X/week   Barriers to D/C:            Co-evaluation              AM-PAC OT "6 Clicks" Daily Activity     Outcome Measure Help from another person eating meals?: None Help from another person taking care of personal grooming?: A Little Help from another person toileting, which includes using toliet, bedpan, or urinal?: A Little Help from another person bathing (including washing, rinsing, drying)?:  A Lot Help from another person to put on and taking off regular upper body clothing?: A Little Help from another person to put on and taking off regular lower body clothing?: A Lot 6 Click Score: 17   End of Session    Activity Tolerance: Patient tolerated treatment well Patient left: in bed;with call bell/phone within reach;with bed alarm set;with nursing/sitter in room;with SCD's reapplied  OT Visit Diagnosis: Other abnormalities of gait and mobility (R26.89);Muscle weakness (generalized) (M62.81)                Time: 9996-7227 OT Time Calculation (min): 26 min Charges:  OT General Charges $OT Visit: 1  Visit OT Evaluation $OT Eval Moderate Complexity: 1 Mod OT Treatments $Self Care/Home Management : 8-22 mins  Hanley Hays, MPH, MS, OTR/L ascom [phone] 11/11/20, 1:23 PM

## 2020-11-11 NOTE — Anesthesia Procedure Notes (Signed)
Procedure Name: Intubation Date/Time: 11/11/2020 4:35 PM Performed by: Chanetta Marshall, CRNA Pre-anesthesia Checklist: Patient identified, Emergency Drugs available, Suction available and Patient being monitored Patient Re-evaluated:Patient Re-evaluated prior to induction Oxygen Delivery Method: Circle system utilized Preoxygenation: Pre-oxygenation with 100% oxygen Induction Type: IV induction Ventilation: Mask ventilation without difficulty Laryngoscope Size: McGraph and 3 Tube type: Oral Tube size: 7.0 mm Number of attempts: 1 Airway Equipment and Method: Stylet and Video-laryngoscopy Placement Confirmation: ETT inserted through vocal cords under direct vision, positive ETCO2 and breath sounds checked- equal and bilateral Secured at: 21 cm Tube secured with: Tape Dental Injury: Teeth and Oropharynx as per pre-operative assessment

## 2020-11-11 NOTE — Op Note (Signed)
Preoperative diagnosis: Dysfunctional PD cath Postoperative diagnosis: Same  Procedure: Laparoscopic peritoneal dialysis catheter removal   anesthesia: General  Surgeon: Dr. Lysle Pearl  Wound Classification: Clean  Specimen: none  Complications: None  Estimated Blood Loss: 50mL   Indications:  See H&P for further details.  Findings: 1.  PD cath secured to abdominal wall within the pelvis with Prolene suture  2.  3 because dissected in the subcutaneous layer and entire PD cath removed 3. Adequate hemostasis achieved  Description of procedure: The patient was taken to the operating room. A time-out was completed verifying correct patient, procedure, site, positioning, and implant(s) and/or special equipment prior to beginning this procedure.  Area was prepped and draped in the usual sterile fashion.   Veress needle inserted at palmer's point.  Saline drop test noted to be positive with gradual increase in pressure after initiation of gas insufflation.  15 mm of pressure was achieved prior to removing the Veress needle and then placing a 8 mm port via the Optiview technique through the supraumbilical site.  Inspection of the area afterwards noted no injury to the surrounding organs during insertion of the needle and the port.  Examination of the abdominal cavity confirmed Prolene suture securing the PD catheter in the pelvis.  No adhesions or other pathology noted.  An additional port was placed in the left lower quadrant and laparoscopic suture cutter was used to remove the Prolene suture and freed the PD cath off the abdominal wall.  Palpation of the catheter along the abdominal wall noted 3 areas where a subcutaneous cuff was palpated.  Incisions were made over these areas after injecting local and dissection carried around the PD catheter cuff until it was freed completely from the surrounding tissue.  Dissection was facilitated by clamping and cutting the PD catheter as needed to allow  greater freedom of movement.  Once all subcuticular cuffs were dissected off completely, the intra-abdominal portion of catheter was pulled out of the abdominal cavity under direct vision.  All the remaining subcuticular portions were also pulled out without any tension.  Silk sutures used to secure the subcutaneous portion of the catheters were visualized afterwards and removed as well.  Abdomen then desufflated and ports removed. All the skin incisions were then closed with a subcuticular stitch of Monocryl 4-0. Dermabond was applied.  The catheter exit site from the right lower quadrant abdominal wall was covered with 2 x 2 gauze and Tegaderm. The patient tolerated the procedure well and was taken to the postanesthesia care unit in stable condition. Sponge and instrument count correct at end of procedure.

## 2020-11-11 NOTE — Op Note (Deleted)
Preoperative diagnosis:  acute and cholecystitis  Postoperative diagnosis: same as above  Procedure: Robotic assisted Laparoscopic Cholecystectomy.   Anesthesia: GETA   Surgeon: Benjamine Sprague  Specimen: Gallbladder  Complications: None  EBL: 3mL  Wound Classification: Clean Contaminated  Indications: see HPI  Findings: Critical view of safety noted Cystic duct and artery identified, ligated and divided, clips remained intact at end of procedure Adequate hemostasis  Description of procedure:  The patient was placed on the operating table in the supine position. SCDs placed, pre-op abx administered.  General anesthesia was induced and OG tube placed by anesthesia. A time-out was completed verifying correct patient, procedure, site, positioning, and implant(s) and/or special equipment prior to beginning this procedure. The abdomen was prepped and draped in the usual sterile fashion.    Veress needle was placed at the Palmer's point and insufflation was started after confirming a positive saline drop test and no immediate increase in abdominal pressure.  After reaching 15 mm, the Veress needle was removed and a 8 mm port was placed via optiview technique under umbilicus measured [id number] from gallbladder.  The abdomen was inspected.  Stomach noted to have 42mm superficial serosal tear from veress needle and extensive adhesions noted along known infraumbilical midline incision. Despite the adhesions, there was room to visualize placing the remaining ports.  Under direct vision, ports were placed in the following locations: One 12 mm patient left of the umbilicus, 8cm from the optiviewed port, one 8 mm port placed to the patient right of the umbilical port 8 cm apart.  1 additional 8 mm port placed lateral to the 87mm port.  Once ports were placed, The table was placed in the reverse Trendelenburg position with the right side up. The Xi platform was brought into the operative field and docked to  the ports successfully.  An endoscope was placed through the umbilical port, fenestrated grasper through the adjacent patient right port, prograsp to the far patient left port, and then a hook cautery in the left port.  Extensive inflammation noted where gallbladder was densely adhered to omentum, colon, stomach and duodenum.  The dome of the gallbladder was grasped with prograsp, passed and retracted over the dome of the liver. Adhesions between the gallbladder and omentum, duodenum and transverse colon were meticulously lysed via hook cautery and gentle traction. The infundibulum was eventually grasped with the fenestrated grasper and retracted toward the right lower quadrant. This maneuver exposed area of Calot's triangle. The peritoneum overlying the gallbladder infundibulum was then dissected using combination of [place] dissector and electrocautery hook and the cystic duct and cystic artery eventually identified.  Critical view of safety with the liver bed clearly visible behind the duct and artery with no additional structures noted.  The cystic duct and cystic artery clipped and divided close to the gallbladder.  Cystic duct was extremely shortened but ICG was utilized to clearly visualize the CBD below the cystic duct, and away from the [place].      The gallbladder was then dissected from its peritoneal and liver bed attachments by electrocautery. Attention turned to the needle injury on the stomach and these was reinforced with lembert suture x2 and omental patch secured using 3-0 silk. Due to extensive inflammation, 15Fr blake drain placed through RLQ port around fossa and secured to skin using 3-0 nylon x2 due to very thin skin.    Hemostasis was checked prior to removing the hook cautery and the Endo Catch bag was then placed through the 12 mm  port and the gallbladder was removed.  The gallbladder was passed off the table as a specimen. There was no evidence of bleeding from the gallbladder fossa or  cystic artery or leakage of the bile from the cystic duct stump. Abdomen desufflated and secondary trocars were removed under direct vision. No bleeding was noted. The 12 mm port site closed 0 vicryl under direct vision.   All skin incisions then closed with subcuticular sutures of 4-0 monocryl and dressed with topical skin adhesive, drain dressed with drain sponge. The orogastric tube was removed and patient extubated.  The patient tolerated the procedure well and was taken to the postanesthesia care unit in stable condition.  All sponge and instrument count correct at end of procedure.

## 2020-11-11 NOTE — Transfer of Care (Signed)
Immediate Anesthesia Transfer of Care Note  Patient: Amy [name]  Procedure(s) Performed: LAPARACOPIC ASSISTED  REMOVAL OF PD CATHETER (Abdomen)  Patient Location: PACU  Anesthesia Type:General  Level of Consciousness: awake, alert  and oriented  Airway & Oxygen Therapy: Patient Spontanous Breathing and Patient connected to face mask oxygen  Post-op Assessment: Report given to RN and Post -op Vital signs reviewed and stable  Post vital signs: Reviewed and stable  Last Vitals:  Vitals Value Taken Time  BP 160/78 11/11/20 1737  Temp    Pulse 73 11/11/20 1740  Resp 20 11/11/20 1740  SpO2 100 % 11/11/20 1740  Vitals shown include unvalidated device data.  Last Pain:  Vitals:   11/11/20 1428  TempSrc: Oral  PainSc:          Complications: No notable events documented.

## 2020-11-11 NOTE — Progress Notes (Signed)
Hemodialysis patient known at [facility]) MWF 10:30am. Patient is currently at [facility] for rehab and stated that they transport her to treatments. Patient stated no dialysis concerns at this time. Please contact me with any dialysis placement concerns.  Elvera Bicker Dialysis Coordinator [phone]

## 2020-11-11 NOTE — Evaluation (Signed)
Physical Therapy Evaluation Patient Details Name: Amy [name] MRN: [id number] DOB: 1950-06-10 Today's Date: 11/11/2020   History of Present Illness  Pt is a 70 y.o. female presenting to hospital 7/8 d/t concerns her R chest Port-A-Cath is not functioning (worsening B UE/LE edema).  Pt admitted with acute on chronic systolic CHF with fluid overload, pulmonary edema, and anasarca.  Permcath exchange 7/11.  PMH includes ESRD on HD MWF, chronic hyponatremia, chronic HA's, multiple admissions (most recent C-diff complicated by NSTEMI s/p PCI with DAPT), recent acute on chronic SDH s/p partial craniotomy for evacuation of SDH, anemia, L ulna shaft fx, gastroparesis, orthostasis, CHF, seizures, R hip fx, R IMN 04/2020, and knee surgery.  Clinical Impression  Prior to hospital admission, pt was at rehab ambulating with RW with staff assist; lives with her husband on main level of home with 2 steps to enter.  Currently pt is mod assist with bed mobility; mod assist x2 to stand up to RW; and mod assist x2 attempting to take steps in place with B UE support on RW (pt able to take a few steps in place with R LE but unable with L LE d/t R knee buckling).  Pt would benefit from skilled PT to address noted impairments and functional limitations (see below for any additional details).  Upon hospital discharge, pt would benefit from SNF.    Follow Up Recommendations SNF    Equipment Recommendations  Rolling walker with 5" wheels;3in1 (PT)    Recommendations for Other Services OT consult     Precautions / Restrictions Precautions Precautions: Fall Precaution Comments: R HD permcath; peritoneal dialysis Restrictions Weight Bearing Restrictions: No      Mobility  Bed Mobility Overal bed mobility: Needs Assistance Bed Mobility: Supine to Sit;Sit to Supine     Supine to sit: Mod assist;HOB elevated Sit to supine: Mod assist;HOB elevated   General bed mobility comments: assist for trunk; assist to  scoot to edge of bed using bed pad; vc's for technique    Transfers Overall transfer level: Needs assistance Equipment used: 2 person hand held assist;Rolling walker (2 wheeled) Transfers: Sit to/from Stand Sit to Stand: Mod assist;+2 physical assistance         General transfer comment: B hand hold assist to stand and then pt placing hands on walker once standing; assist to control descent sitting; vc's for technique  Ambulation/Gait Ambulation/Gait assistance: Mod assist;+2 physical assistance Gait Distance (Feet):  (0) Assistive device: Rolling walker (2 wheeled)   Gait velocity: decreased   General Gait Details: pt able to take a few steps with R LE in place but unable to take any steps in place with L LE d/t R knee buckling  Stairs            Wheelchair Mobility    Modified Rankin (Stroke Patients Only)       Balance Overall balance assessment: Needs assistance Sitting-balance support: No upper extremity supported;Feet supported Sitting balance-Leahy Scale: Good Sitting balance - Comments: steady sitting reaching within BOS   Standing balance support: Bilateral upper extremity supported Standing balance-Leahy Scale: Poor Standing balance comment: pt with posterior lean in standing requiring min to mod assist for balance and vc's to shift weight forward                             Pertinent Vitals/Pain Pain Assessment: 0-10 Pain Score: 7  Pain Location: Headache Pain Descriptors / Indicators: Headache Pain  Intervention(s): Limited activity within patient's tolerance;Monitored during session;Repositioned;Patient requesting pain meds-RN notified;RN gave pain meds during session Vitals (HR and O2 on room air) stable and WFL throughout treatment session.    Home Living Family/patient expects to be discharged to:: Private residence Living Arrangements: Spouse/significant other Available Help at Discharge: Family Type of Home: House Home Access:  Stairs to enter Entrance Stairs-Rails: None Entrance Stairs-Number of Steps: 2 Home Layout: Two level;Able to live on main level with bedroom/bathroom Home Equipment: Gilford Rile - 2 wheels;Bedside commode      Prior Function Level of Independence: Needs assistance   Gait / Transfers Assistance Needed: Pt has been using her RW since hip fx in December 2021  ADL's / Homemaking Assistance Needed: Spouse assisting with ADL's  Comments: No recent falls     Hand Dominance        Extremity/Trunk Assessment   Upper Extremity Assessment Upper Extremity Assessment: Generalized weakness    Lower Extremity Assessment Lower Extremity Assessment: Generalized weakness    Cervical / Trunk Assessment Cervical / Trunk Assessment: Normal  Communication   Communication: No difficulties  Cognition Arousal/Alertness: Awake/alert Behavior During Therapy: WFL for tasks assessed/performed Overall Cognitive Status: Within Functional Limits for tasks assessed                                        General Comments General comments (skin integrity, edema, etc.): mild red drainage noted R permcath site prior to activity (no change noted end of session); peritoneal dialysis site in place.  Nursing cleared pt for participation in physical therapy.  Pt agreeable to PT session.    Exercises  Transfer/mobility training   Assessment/Plan    PT Assessment Patient needs continued PT services  PT Problem List Decreased strength;Decreased activity tolerance;Decreased balance;Decreased mobility;Decreased knowledge of use of DME;Pain       PT Treatment Interventions DME instruction;Gait training;Stair training;Functional mobility training;Therapeutic activities;Therapeutic exercise;Balance training;Patient/family education    PT Goals (Current goals can be found in the Care Plan section)  Acute Rehab PT Goals Patient Stated Goal: to improve strength and mobility PT Goal Formulation: With  patient Time For Goal Achievement: 11/25/20 Potential to Achieve Goals: Good    Frequency Min 2X/week   Barriers to discharge Decreased caregiver support      Co-evaluation               AM-PAC PT "6 Clicks" Mobility  Outcome Measure Help needed turning from your back to your side while in a flat bed without using bedrails?: A Little Help needed moving from lying on your back to sitting on the side of a flat bed without using bedrails?: A Lot Help needed moving to and from a bed to a chair (including a wheelchair)?: Total Help needed standing up from a chair using your arms (e.g., wheelchair or bedside chair)?: Total Help needed to walk in hospital room?: Total Help needed climbing 3-5 steps with a railing? : Total 6 Click Score: 9    End of Session   Activity Tolerance: Patient limited by fatigue Patient left: in bed;with call bell/phone within reach;with bed alarm set;with family/visitor present;Other (comment) (B heels floating via pillow support) Nurse Communication: Mobility status;Precautions;Patient requests pain meds PT Visit Diagnosis: Other abnormalities of gait and mobility (R26.89);Muscle weakness (generalized) (M62.81);History of falling (Z91.81);Difficulty in walking, not elsewhere classified (R26.2)    Time: 2671-2458 PT Time Calculation (min) (ACUTE ONLY):  33 min   Charges:   PT Evaluation $PT Eval Low Complexity: 1 Low PT Treatments $Therapeutic Activity: 8-22 mins       Leitha Bleak, PT 11/11/20, 10:07 AM

## 2020-11-11 NOTE — Progress Notes (Signed)
[facility]  ROUNDING NOTE   Subjective:   Hemodialysis yesterday, patient tolerated well Increased pressures with catheter, vascular successfully exchanged catheter yesterday  Scheduled for PD cath removal today by general surgery  Patient resting in bed, husband at bedside Denies pain Currently NPO for procedure Husband believes that patient would be able to transition back to PD after rehab Patient seen later to explain procedure, husband not at bedisde   Objective:  Vital signs in last 24 hours:  Temp:  [98 F (36.7 C)-98.7 F (37.1 C)] 98 F (36.7 C) (07/12 0838) Pulse Rate:  [68-82] 68 (07/12 0838) Resp:  [12-18] 14 (07/12 0838) BP: (131-155)/(67-82) 150/74 (07/12 0838) SpO2:  [95 %-98 %] 95 % (07/12 0838)  Weight change:  Filed Weights   11/09/20 0500 11/09/20 1000 11/10/20 0336  Weight: 61.2 kg 57.4 kg 56.4 kg    Intake/Output: I/O last 3 completed shifts: In: 473.3 [P.O.:360; I.V.:13.3; IV Piggyback:100] Out: 2595 [Urine:95; Other:2500]   Intake/Output this shift:  No intake/output data recorded.  Physical Exam: General: NAD, laying in bed  Head: Normocephalic, atraumatic. Moist oral mucosal membranes  Eyes: Anicteric  Lungs:  Clear to auscultation, normal effort  Heart: Regular rate and rhythm  Abdomen:  Soft, nontender  Extremities:  ++ peripheral edema.  Neurologic: Nonfocal, moving all four extremities  Skin: No lesions  Access: RIJ permcath, PD catheter    Basic Metabolic Panel: Recent Labs  Lab 11/07/20 1415 11/08/20 0437 11/09/20 0421 11/10/20 0400 11/11/20 0510  NA 122* 125* 129* 128* 130*  K 4.3 4.0 3.4* 3.1* 3.1*  CL 96* 98 98 97* 97*  CO2 18* 23 28 27 28   GLUCOSE 98 76 94 91 90  BUN 34* 35* 23 25* 17  CREATININE 3.20* 3.38* 2.39* 2.87* 2.32*  CALCIUM 7.8* 8.0* 7.5* 7.7* 8.0*  MG  --   --  1.5* 1.9 1.7  PHOS  --   --   --  3.3  --      Liver Function Tests: No results for input(s): AST, ALT, ALKPHOS,  BILITOT, PROT, ALBUMIN in the last 168 hours. No results for input(s): LIPASE, AMYLASE in the last 168 hours. No results for input(s): AMMONIA in the last 168 hours.  CBC: Recent Labs  Lab 11/07/20 1415 11/09/20 0421 11/10/20 0400 11/11/20 0510  WBC 10.4 6.3 6.3 5.6  HGB 10.1* 8.1* 8.3* 8.6*  HCT 33.2* 25.0* 25.8* 26.6*  MCV 89.7 84.7 84.9 83.9  PLT 350 281 273 253     Cardiac Enzymes: No results for input(s): CKTOTAL, CKMB, CKMBINDEX, TROPONINI in the last 168 hours.  BNP: Invalid input(s): POCBNP  CBG: No results for input(s): GLUCAP in the last 168 hours.  Microbiology: Results for orders placed or performed during the hospital encounter of 11/07/20  Resp Panel by RT-PCR (Flu A&B, Covid) Nasopharyngeal Swab     Status: None   Collection Time: 11/07/20  4:00 PM   Specimen: Nasopharyngeal Swab; Nasopharyngeal(NP) swabs in vial transport medium  Result Value Ref Range Status   SARS Coronavirus 2 by RT PCR NEGATIVE NEGATIVE Final    Comment: (NOTE) SARS-CoV-2 target nucleic acids are NOT DETECTED.  The SARS-CoV-2 RNA is generally detectable in upper respiratory specimens during the acute phase of infection. The lowest concentration of SARS-CoV-2 viral copies this assay can detect is 138 copies/mL. A negative result does not preclude SARS-Cov-2 infection and should not be used as the sole basis for treatment or other patient management decisions. A negative result  may occur with  improper specimen collection/handling, submission of specimen other than nasopharyngeal swab, presence of viral mutation(s) within the areas targeted by this assay, and inadequate number of viral copies(<138 copies/mL). A negative result must be combined with clinical observations, patient history, and epidemiological information. The expected result is Negative.  Fact Sheet for Patients:  EntrepreneurPulse.com.au  Fact Sheet for Healthcare Providers:   IncredibleEmployment.be  This test is no t yet approved or cleared by the [place] FDA and  has been authorized for detection and/or diagnosis of SARS-CoV-2 by FDA under an Emergency Use Authorization (EUA). This EUA will remain  in effect (meaning this test can be used) for the duration of the COVID-19 declaration under Section 564(b)(1) of the Act, 21 U.S.C.section 360bbb-3(b)(1), unless the authorization is terminated  or revoked sooner.       Influenza A by PCR NEGATIVE NEGATIVE Final   Influenza B by PCR NEGATIVE NEGATIVE Final    Comment: (NOTE) The Xpert Xpress SARS-CoV-2/FLU/RSV plus assay is intended as an aid in the diagnosis of influenza from Nasopharyngeal swab specimens and should not be used as a sole basis for treatment. Nasal washings and aspirates are unacceptable for Xpert Xpress SARS-CoV-2/FLU/RSV testing.  Fact Sheet for Patients: EntrepreneurPulse.com.au  Fact Sheet for Healthcare Providers: IncredibleEmployment.be  This test is not yet approved or cleared by the [place] FDA and has been authorized for detection and/or diagnosis of SARS-CoV-2 by FDA under an Emergency Use Authorization (EUA). This EUA will remain in effect (meaning this test can be used) for the duration of the COVID-19 declaration under Section 564(b)(1) of the Act, 21 U.S.C. section 360bbb-3(b)(1), unless the authorization is terminated or revoked.  Performed at [facility], [address] [place], [place] [address]     Coagulation Studies: No results for input(s): LABPROT, INR in the last 72 hours.  Urinalysis: No results for input(s): COLORURINE, LABSPEC, PHURINE, GLUCOSEU, HGBUR, BILIRUBINUR, KETONESUR, PROTEINUR, UROBILINOGEN, NITRITE, LEUKOCYTESUR in the last 72 hours.  Invalid input(s): APPERANCEUR    Imaging: PERIPHERAL VASCULAR CATHETERIZATION  Result Date: 11/10/2020 See surgical note for  result.    Medications:    potassium chloride 10 mEq (11/11/20 1030)    atorvastatin  40 mg Oral Daily   Chlorhexidine Gluconate Cloth  6 each Topical Daily   docusate sodium  100 mg Oral BID   furosemide  60 mg Intravenous Q12H   lacosamide  50 mg Oral BID   levETIRAcetam  500 mg Oral QHS   [START ON 11/12/2020] levETIRAcetam  500 mg Oral Q M,W,F   melatonin  5 mg Oral QHS   metoCLOPramide  5 mg Oral QPC breakfast   metoprolol tartrate  6.25 mg Oral BID   multivitamin  1 tablet Oral QHS   pantoprazole  40 mg Oral Daily   acetaminophen **OR** acetaminophen, butalbital-acetaminophen-caffeine, camphor-menthol, HYDROmorphone (DILAUDID) injection, magnesium hydroxide, methocarbamol, ondansetron **OR** ondansetron (ZOFRAN) IV, ondansetron (ZOFRAN) IV, prochlorperazine, traZODone  Assessment/ Plan:  [name] is a 70 y.o. white female with end stage renal disease on hemodialysis, hypertension, hyperlipidemia, seizure disorder, GERD, anxiety who is admitted to [facility] on 11/07/2020 for Pulmonary edema [J81.1] ESRD (end stage renal disease) ([place]) [G92.1] Complication associated with dialysis catheter [T82.9XXA]  [facility] PD - on back up hemodialysis Hemodialysis: MWF [facility] RIJ permcath 53 kg  End stage renal disease: on back up hemodialysis. Currently over 10 kg above her estimated dry weight on admission.  Bed weight this morning 57.4kg (4.4 kg above  dry weight) - Received catheter exchange via vascular yesterday - Surgery will remove PD catheter today, explained the patient that it places her at extreme risk for infection and it's not functioning currently. Patient agreeable to removing - Next scheduled treatment on Thursday  Hypertension: 150/74. Continue on metoprolol and furosemide.   Anemia of chronic kidney disease: hemoglobin 8.6. Aranesp given on dialysis treatment 7/9. Gets EPO with each treatment as outpatient    Secondary Hyperparathyroidism: currently not on  binders.  Hyponatremia: improving with hemodialysis. Related to volume overload.  Current level 130. Fluid restriction of 1234ml daily   LOS: 4   7/12/202211:45 AM

## 2020-11-11 NOTE — Interval H&P Note (Signed)
History and Physical Interval Note:  11/11/2020 4:09 PM  Amy [name]  has presented today for surgery, with the diagnosis of Mafunctioning PD catheter.  The various methods of treatment have been discussed with the patient and family. After consideration of risks, benefits and other options for treatment, the patient has consented to  Procedure(s): XI ROBOT ASSISTED REMOVAL OF PD CATHETER (N/A) as a surgical intervention.  The patient's history has been reviewed, patient examined, no change in status, stable for surgery.  I have reviewed the patient's chart and labs.  Questions were answered to the patient's satisfaction.     Glenda Kunst Lysle Pearl

## 2020-11-11 NOTE — Anesthesia Preprocedure Evaluation (Signed)
Anesthesia Evaluation  Patient identified by MRN, date of birth, ID band Patient awake    Reviewed: Allergy & Precautions, NPO status , Patient's Chart, lab work & pertinent test results  History of Anesthesia Complications Negative for: history of anesthetic complications  Airway Mallampati: III  TM Distance: >3 FB Neck ROM: Full    Dental no notable dental hx.    Pulmonary neg pulmonary ROS, neg sleep apnea, neg COPD,    breath sounds clear to auscultation- rhonchi (-) wheezing      Cardiovascular hypertension, Pt. on medications + CAD and + Past MI  (-) Cardiac Stents and (-) CABG  Rhythm:Regular Rate:Normal - Systolic murmurs and - Diastolic murmurs    Neuro/Psych  Headaches, Seizures - (single seizure ),  PSYCHIATRIC DISORDERS Anxiety Depression    GI/Hepatic Neg liver ROS, GERD  ,  Endo/Other  negative endocrine ROSneg diabetes  Renal/GU ESRF and DialysisRenal disease     Musculoskeletal  (+) Arthritis ,   Abdominal (+) - obese,   Peds  Hematology  (+) anemia ,   Anesthesia Other Findings Past Medical History: No date: Anemia     Comment:  a. 08/2016 Guaiac + stool. EGD w/ gastritis. No date: Anxiety 11/27/2018: Closed fracture of shaft of left ulna No date: Colon polyp     Comment:  a. 08/2016 Colonoscopy. No date: ESRD on peritoneal dialysis ([place]) No date: Gastritis     Comment:  a. 08/2016 EGD: Gastritis-->PPI. No date: GERD (gastroesophageal reflux disease) No date: History of chicken pox No date: History of measles as a child No date: Hypertension No date: Hyponatremia     Comment:  a. 08/2016 in setting of HCTZ Rx. No date: Leukocytosis 04/08/2017: Mesenteric lymphadenopathy No date: Multiple thyroid nodules   Reproductive/Obstetrics                             Anesthesia Physical Anesthesia Plan  ASA: 4  Anesthesia Plan: General   Post-op Pain Management:     Induction: Intravenous  PONV Risk Score and Plan: 2 and Ondansetron and Dexamethasone  Airway Management Planned: Oral ETT  Additional Equipment:   Intra-op Plan:   Post-operative Plan: Extubation in OR  Informed Consent: I have reviewed the patients History and Physical, chart, labs and discussed the procedure including the risks, benefits and alternatives for the proposed anesthesia with the patient or authorized representative who has indicated his/her understanding and acceptance.     Dental advisory given  Plan Discussed with: CRNA and Anesthesiologist  Anesthesia Plan Comments:         Anesthesia Quick Evaluation

## 2020-11-12 DIAGNOSIS — J810 Acute pulmonary edema: Secondary | ICD-10-CM | POA: Diagnosis not present

## 2020-11-12 DIAGNOSIS — N186 End stage renal disease: Secondary | ICD-10-CM | POA: Diagnosis not present

## 2020-11-12 DIAGNOSIS — T829XXA Unspecified complication of cardiac and vascular prosthetic device, implant and graft, initial encounter: Secondary | ICD-10-CM | POA: Diagnosis not present

## 2020-11-12 DIAGNOSIS — I10 Essential (primary) hypertension: Secondary | ICD-10-CM | POA: Diagnosis not present

## 2020-11-12 LAB — CBC
HCT: 26.3 % — ABNORMAL LOW (ref 36.0–46.0)
Hemoglobin: 8.5 g/dL — ABNORMAL LOW (ref 12.0–15.0)
MCH: 27.9 pg (ref 26.0–34.0)
MCHC: 32.3 g/dL (ref 30.0–36.0)
MCV: 86.2 fL (ref 80.0–100.0)
Platelets: 258 10*3/uL (ref 150–400)
RBC: 3.05 MIL/uL — ABNORMAL LOW (ref 3.87–5.11)
RDW: 15.4 % (ref 11.5–15.5)
WBC: 8.2 10*3/uL (ref 4.0–10.5)
nRBC: 0 % (ref 0.0–0.2)

## 2020-11-12 LAB — BASIC METABOLIC PANEL
Anion gap: 5 (ref 5–15)
BUN: 20 mg/dL (ref 8–23)
CO2: 27 mmol/L (ref 22–32)
Calcium: 8.3 mg/dL — ABNORMAL LOW (ref 8.9–10.3)
Chloride: 97 mmol/L — ABNORMAL LOW (ref 98–111)
Creatinine, Ser: 2.81 mg/dL — ABNORMAL HIGH (ref 0.44–1.00)
GFR, Estimated: 17 mL/min — ABNORMAL LOW (ref >60)
Glucose, Bld: 112 mg/dL — ABNORMAL HIGH (ref 70–99)
Potassium: 4.3 mmol/L (ref 3.5–5.1)
Sodium: 129 mmol/L — ABNORMAL LOW (ref 135–145)

## 2020-11-12 LAB — MAGNESIUM: Magnesium: 1.7 mg/dL (ref 1.7–2.4)

## 2020-11-12 MED ORDER — HEPARIN SODIUM (PORCINE) 5000 UNIT/ML IJ SOLN
5000.0000 [IU] | Freq: Three times a day (TID) | INTRAMUSCULAR | Status: DC
  Administered 2020-11-12 – 2020-11-13 (×3): 5000 [IU] via SUBCUTANEOUS
  Filled 2020-11-12 (×3): qty 1

## 2020-11-12 MED ORDER — HEPARIN SODIUM (PORCINE) 1000 UNIT/ML IJ SOLN
INTRAMUSCULAR | Status: AC
  Filled 2020-11-12: qty 1

## 2020-11-12 NOTE — Discharge Instructions (Addendum)
Change former PD cath site dressing and cover with large band-aid daily until healed. No activity restrictions needed from surgery standpoint  resume your hemodialysis Monday Wednesday Friday.

## 2020-11-12 NOTE — Plan of Care (Signed)
  Problem: Health Behavior/Discharge Planning: Goal: Ability to manage health-related needs will improve Outcome: Progressing   Problem: Clinical Measurements: Goal: Will remain free from infection Outcome: Progressing Goal: Respiratory complications will improve Outcome: Progressing   Problem: Activity: Goal: Risk for activity intolerance will decrease Outcome: Progressing   Problem: Nutrition: Goal: Adequate nutrition will be maintained Outcome: Progressing   Problem: Coping: Goal: Level of anxiety will decrease Outcome: Progressing

## 2020-11-12 NOTE — Anesthesia Postprocedure Evaluation (Signed)
Anesthesia Post Note  Patient: Amy [name]  Procedure(s) Performed: LAPARACOPIC ASSISTED  REMOVAL OF PD CATHETER (Abdomen)  Patient location during evaluation: PACU Anesthesia Type: General Level of consciousness: awake and alert Pain management: pain level controlled Vital Signs Assessment: post-procedure vital signs reviewed and stable Respiratory status: spontaneous breathing, nonlabored ventilation, respiratory function stable and patient connected to nasal cannula oxygen Cardiovascular status: blood pressure returned to baseline and stable Postop Assessment: no apparent nausea or vomiting Anesthetic complications: no   No notable events documented.   Last Vitals:  Vitals:   11/11/20 1821 11/11/20 1934  BP: (!) 144/76 (!) 145/77  Pulse: 67 70  Resp: 13 18  Temp: 37.1 C 36.7 C  SpO2: 98% 94%    Last Pain:  Vitals:   11/11/20 2026  TempSrc:   PainSc: 0-No pain                 Molli Barrows

## 2020-11-12 NOTE — Progress Notes (Signed)
Mobility Specialist - Progress Note   11/12/20 1500  Mobility  Activity Refused mobility  Mobility performed by Mobility specialist    Pt politely declined mobility this date, no reason specified. Pt voices fatigue from HD tx and requests to rest at this time. Also c/o abdominal pain.   Kathee Delton Mobility Specialist 11/12/20, 3:17 PM

## 2020-11-12 NOTE — Progress Notes (Signed)
Subjective:  CC: Amy [name] is a 70 y.o. female  Hospital stay day 5, 1 Day Post-Op lap PD cath removal   HPI: No issues overnight.  ROS:  General: Denies weight loss, weight gain, fatigue, fevers, chills, and night sweats. Heart: Denies chest pain, palpitations, racing heart, irregular heartbeat, leg pain or swelling, and decreased activity tolerance. Respiratory: Denies breathing difficulty, shortness of breath, wheezing, cough, and sputum. GI: Denies change in appetite, heartburn, nausea, vomiting, constipation, diarrhea, and blood in stool. GU: Denies difficulty urinating, pain with urinating, urgency, frequency, blood in urine.   Objective:   Temp:  [97.9 F (36.6 C)-98.8 F (37.1 C)] 97.9 F (36.6 C) (07/13 0457) Pulse Rate:  [67-80] 70 (07/13 0457) Resp:  [13-20] 16 (07/13 0457) BP: (140-160)/(72-80) 142/74 (07/13 0457) SpO2:  [94 %-98 %] 96 % (07/13 0457)     Height: 5\' 4"  (162.6 cm) Weight: 56.4 kg BMI (Calculated): 21.33   Intake/Output this shift:   Intake/Output Summary (Last 24 hours) at 11/12/2020 0717 Last data filed at 11/12/2020 0208 Gross per 24 hour  Intake 1040 ml  Output 2 ml  Net 1038 ml    Constitutional :  alert, cooperative, appears stated age, and no distress  Respiratory:  clear to auscultation bilaterally  Cardiovascular:  regular rate and rhythm  Gastrointestinal: soft, non-tender; bowel sounds normal; no masses,  no organomegaly.   Skin: Cool and moist. Incisions c/d/I. Former PD cath site dressing intact, clear discharge as expected.  Psychiatric: Normal affect, non-agitated, not confused       LABS:  CMP Latest Ref Rng & Units 11/12/2020 11/11/2020 11/10/2020  Glucose 70 - 99 mg/dL 112(H) 90 91  BUN 8 - 23 mg/dL 20 17 25(H)  Creatinine 0.44 - 1.00 mg/dL 2.81(H) 2.32(H) 2.87(H)  Sodium 135 - 145 mmol/L 129(L) 130(L) 128(L)  Potassium 3.5 - 5.1 mmol/L 4.3 3.1(L) 3.1(L)  Chloride 98 - 111 mmol/L 97(L) 97(L) 97(L)  CO2 22 - 32 mmol/L 27 28  27   Calcium 8.9 - 10.3 mg/dL 8.3(L) 8.0(L) 7.7(L)  Total Protein 6.5 - 8.1 g/dL - - -  Total Bilirubin 0.3 - 1.2 mg/dL - - -  Alkaline Phos 38 - 126 U/L - - -  AST 15 - 41 U/L - - -  ALT 0 - 44 U/L - - -   CBC Latest Ref Rng & Units 11/12/2020 11/11/2020 11/10/2020  WBC 4.0 - 10.5 K/uL 8.2 5.6 6.3  Hemoglobin 12.0 - 15.0 g/dL 8.5(L) 8.6(L) 8.3(L)  Hematocrit 36.0 - 46.0 % 26.3(L) 26.6(L) 25.8(L)  Platelets 150 - 400 K/uL 258 253 273    RADS: N/a Assessment:   S/p lap PD cath removal.  Doing well.  F/u prn with surgery if any concerns with incisions.  Change former PD cath site dressing tomorrow and cover with large band-aid daily until healed.

## 2020-11-12 NOTE — Progress Notes (Signed)
Patient was unable to run due to high AP, powered flushed blood lines twice and reversed lines and treatment still would not run successful. Rinsed pt back per RN. Doctor is aware of treatment termination and why.

## 2020-11-12 NOTE — Progress Notes (Signed)
OT Cancellation Note  Patient Details Name: Amy [name] MRN: [id number] DOB: June 14, 1950   Cancelled Treatment:    Reason Eval/Treat Not Completed: Patient at procedure or test/ unavailable Pt OTF to HD at this time. Will f/u at later date/time as able. Thank you.  Gerrianne Scale, [place], OTR/L ascom [phone] 11/12/20, 11:48 AM

## 2020-11-12 NOTE — Progress Notes (Signed)
Spoke with Shae in central telemetry at [phone] to transfer patient from room 207 to [facility]. BFR down from 400 to 350 due to high AP RN and doctor is aware.

## 2020-11-12 NOTE — Progress Notes (Signed)
Pt refused IV lasix. States she is tired of increased urinary frequency. Verbalizes understanding that lasix will reduce fluid overload.

## 2020-11-12 NOTE — Progress Notes (Signed)
[facility]  ROUNDING NOTE   Subjective:   Hemodialysis yesterday, patient tolerated well Increased pressures with catheter, vascular successfully exchanged catheter yesterday  Scheduled for PD cath removal today by general surgery  Patient seen during dialysis   HEMODIALYSIS FLOWSHEET:  Blood Flow Rate (mL/min): 150 mL/min Arterial Pressure (mmHg): -80 mmHg Venous Pressure (mmHg): 70 mmHg Transmembrane Pressure (mmHg): 30 mmHg Ultrafiltration Rate (mL/min): 70 mL/min Dialysate Flow Rate (mL/min): 500 ml/min Conductivity: Machine : 13.7 Conductivity: Machine : 13.7 Dialysis Fluid Bolus: Normal Saline Bolus Amount (mL): 250 mL Dialysate Change: Other (comment) (seq)  No complaints during dialysis  Treatment ended early due to increased pressures within catheter  Objective:  Vital signs in last 24 hours:  Temp:  [97.9 F (36.6 C)-98.8 F (37.1 C)] 98 F (36.7 C) (07/13 0734) Pulse Rate:  [67-80] 71 (07/13 0734) Resp:  [13-20] 18 (07/13 0734) BP: (140-160)/(72-80) 142/77 (07/13 0734) SpO2:  [94 %-98 %] 95 % (07/13 0734)  Weight change:  Filed Weights   11/09/20 0500 11/09/20 1000 11/10/20 0336  Weight: 61.2 kg 57.4 kg 56.4 kg    Intake/Output: I/O last 3 completed shifts: In: 9735 [P.O.:240; I.V.:750; IV Piggyback:50] Out: 72 [Urine:70; Blood:2]   Intake/Output this shift:  No intake/output data recorded.  Physical Exam: General: NAD, laying in bed  Head: Normocephalic, atraumatic. Moist oral mucosal membranes  Eyes: Anicteric  Lungs:  Clear to auscultation, normal effort  Heart: Regular rate and rhythm  Abdomen:  Soft, nontender  Extremities:  ++ peripheral edema.  Neurologic: Nonfocal, moving all four extremities  Skin: No lesions  Access: RIJ permcath, PD catheter    Basic Metabolic Panel: Recent Labs  Lab 11/08/20 0437 11/09/20 0421 11/10/20 0400 11/11/20 0510 11/12/20 0439  NA 125* 129* 128* 130* 129*  K 4.0 3.4* 3.1* 3.1*  4.3  CL 98 98 97* 97* 97*  CO2 23 28 27 28 27   GLUCOSE 76 94 91 90 112*  BUN 35* 23 25* 17 20  CREATININE 3.38* 2.39* 2.87* 2.32* 2.81*  CALCIUM 8.0* 7.5* 7.7* 8.0* 8.3*  MG  --  1.5* 1.9 1.7 1.7  PHOS  --   --  3.3  --   --      Liver Function Tests: No results for input(s): AST, ALT, ALKPHOS, BILITOT, PROT, ALBUMIN in the last 168 hours. No results for input(s): LIPASE, AMYLASE in the last 168 hours. No results for input(s): AMMONIA in the last 168 hours.  CBC: Recent Labs  Lab 11/07/20 1415 11/09/20 0421 11/10/20 0400 11/11/20 0510 11/12/20 0439  WBC 10.4 6.3 6.3 5.6 8.2  HGB 10.1* 8.1* 8.3* 8.6* 8.5*  HCT 33.2* 25.0* 25.8* 26.6* 26.3*  MCV 89.7 84.7 84.9 83.9 86.2  PLT 350 281 273 253 258     Cardiac Enzymes: No results for input(s): CKTOTAL, CKMB, CKMBINDEX, TROPONINI in the last 168 hours.  BNP: Invalid input(s): POCBNP  CBG: No results for input(s): GLUCAP in the last 168 hours.  Microbiology: Results for orders placed or performed during the hospital encounter of 11/07/20  Resp Panel by RT-PCR (Flu A&B, Covid) Nasopharyngeal Swab     Status: None   Collection Time: 11/07/20  4:00 PM   Specimen: Nasopharyngeal Swab; Nasopharyngeal(NP) swabs in vial transport medium  Result Value Ref Range Status   SARS Coronavirus 2 by RT PCR NEGATIVE NEGATIVE Final    Comment: (NOTE) SARS-CoV-2 target nucleic acids are NOT DETECTED.  The SARS-CoV-2 RNA is generally detectable in upper respiratory specimens during  the acute phase of infection. The lowest concentration of SARS-CoV-2 viral copies this assay can detect is 138 copies/mL. A negative result does not preclude SARS-Cov-2 infection and should not be used as the sole basis for treatment or other patient management decisions. A negative result may occur with  improper specimen collection/handling, submission of specimen other than nasopharyngeal swab, presence of viral mutation(s) within the areas targeted by  this assay, and inadequate number of viral copies(<138 copies/mL). A negative result must be combined with clinical observations, patient history, and epidemiological information. The expected result is Negative.  Fact Sheet for Patients:  EntrepreneurPulse.com.au  Fact Sheet for Healthcare Providers:  IncredibleEmployment.be  This test is no t yet approved or cleared by the [place] FDA and  has been authorized for detection and/or diagnosis of SARS-CoV-2 by FDA under an Emergency Use Authorization (EUA). This EUA will remain  in effect (meaning this test can be used) for the duration of the COVID-19 declaration under Section 564(b)(1) of the Act, 21 U.S.C.section 360bbb-3(b)(1), unless the authorization is terminated  or revoked sooner.       Influenza A by PCR NEGATIVE NEGATIVE Final   Influenza B by PCR NEGATIVE NEGATIVE Final    Comment: (NOTE) The Xpert Xpress SARS-CoV-2/FLU/RSV plus assay is intended as an aid in the diagnosis of influenza from Nasopharyngeal swab specimens and should not be used as a sole basis for treatment. Nasal washings and aspirates are unacceptable for Xpert Xpress SARS-CoV-2/FLU/RSV testing.  Fact Sheet for Patients: EntrepreneurPulse.com.au  Fact Sheet for Healthcare Providers: IncredibleEmployment.be  This test is not yet approved or cleared by the [place] FDA and has been authorized for detection and/or diagnosis of SARS-CoV-2 by FDA under an Emergency Use Authorization (EUA). This EUA will remain in effect (meaning this test can be used) for the duration of the COVID-19 declaration under Section 564(b)(1) of the Act, 21 U.S.C. section 360bbb-3(b)(1), unless the authorization is terminated or revoked.  Performed at [facility], [address] [place], [place] [address]     Coagulation Studies: No results for input(s): LABPROT, INR in the  last 72 hours.  Urinalysis: No results for input(s): COLORURINE, LABSPEC, PHURINE, GLUCOSEU, HGBUR, BILIRUBINUR, KETONESUR, PROTEINUR, UROBILINOGEN, NITRITE, LEUKOCYTESUR in the last 72 hours.  Invalid input(s): APPERANCEUR    Imaging: PERIPHERAL VASCULAR CATHETERIZATION  Result Date: 11/10/2020 See surgical note for result.    Medications:      atorvastatin  40 mg Oral Daily   Chlorhexidine Gluconate Cloth  6 each Topical Daily   docusate sodium  100 mg Oral BID   furosemide  60 mg Intravenous Q12H   heparin injection (subcutaneous)  5,000 Units Subcutaneous Q8H   lacosamide  50 mg Oral BID   levETIRAcetam  500 mg Oral QHS   levETIRAcetam  500 mg Oral Q M,W,F   melatonin  5 mg Oral QHS   metoCLOPramide  5 mg Oral QPC breakfast   metoprolol tartrate  6.25 mg Oral BID   multivitamin  1 tablet Oral QHS   pantoprazole  40 mg Oral Daily   acetaminophen **OR** acetaminophen, butalbital-acetaminophen-caffeine, camphor-menthol, HYDROmorphone (DILAUDID) injection, magnesium hydroxide, methocarbamol, ondansetron **OR** ondansetron (ZOFRAN) IV, ondansetron (ZOFRAN) IV, prochlorperazine, traZODone  Assessment/ Plan:  Amy [name] is a 70 y.o. white female with end stage renal disease on hemodialysis, hypertension, hyperlipidemia, seizure disorder, GERD, anxiety who is admitted to [facility] on 11/07/2020 for Pulmonary edema [J81.1] ESRD (end stage renal disease) ([place]) [R42.7] Complication associated with dialysis catheter [  T82.9XXA]  [facility] PD - on back up hemodialysis Hemodialysis: MWF [facility] RIJ permcath 53 kg  End stage renal disease: on back up hemodialysis. Currently over 10 kg above her estimated dry weight on admission.  Bed weight this morning 57.4kg (4.4 kg above dry weight) - Received Permcath exchange on 11/10/20. Attempted dialysis today with decreased BFR. Consistent increased pressures caused HD to be terminated early. Will instill CathFlo in am and perform short  dialysis treatment tomorrow - Next scheduled treatment on Thursday  Hypertension: 117 /59. Continue on metoprolol and furosemide.   Anemia of chronic kidney disease: hemoglobin 8.5. Aranesp given on dialysis treatment 7/9. Gets EPO with each treatment as outpatient    Secondary Hyperparathyroidism: currently not on binders.  Hyponatremia: improving with hemodialysis. Related to volume overload.  Current level 129. Fluid restriction of 1273ml daily   LOS: 5   7/13/20229:44 AM

## 2020-11-12 NOTE — Consult Note (Addendum)
[facility] Nurse Consult Note: Reason for Consult: Consult requested for left thigh.  Pt went to the OR yesterday and is unaware how she developed a skin tear to this location. WOC consult was previously performed on 7/9 for a Stage 2 pressure injury to the sacrum. Wound type: Left anterior thigh with partial thickness skin tear; .2X5X.1cm, loose peeling skin from a linear skin tear with pink moist wound bed, small amt tan drainage, reddish-purple bruised skin surrounding the site. Topical treatment orders provided for bedside nurses to perform as follows to protect and promote healing:  Foam dressing to left thigh, change Q 3 days or PRN soiling. Please re-consult if further assistance is needed.  Thank-you,  Julien Girt MSN, [place], [place], [place], [place]

## 2020-11-12 NOTE — Progress Notes (Signed)
[name] [place] NAME: Amy [name]    MR#:  [id number]  DATE OF BIRTH:  07-04-1950  SUBJECTIVE:   patient was unable to complete her full cycle of dialysis today given some issues with new HD cath. denies any other complaints. REVIEW OF SYSTEMS:   Review of Systems  Constitutional:  Negative for chills, fever and weight loss.  HENT:  Negative for ear discharge, ear pain and nosebleeds.   Eyes:  Negative for blurred vision, pain and discharge.  Respiratory:  Negative for sputum production, shortness of breath, wheezing and stridor.   Cardiovascular:  Negative for chest pain, palpitations, orthopnea and PND.  Gastrointestinal:  Negative for abdominal pain, diarrhea, nausea and vomiting.  Genitourinary:  Negative for frequency and urgency.  Musculoskeletal:  Negative for back pain and joint pain.  Neurological:  Negative for sensory change, speech change, focal weakness and weakness.  Psychiatric/Behavioral:  Negative for depression and hallucinations. The patient is not nervous/anxious.   Tolerating Diet:yes Tolerating PT:   DRUG ALLERGIES:   Allergies  Allergen Reactions   Cephalosporins Hives and Swelling   Clarithromycin Other (See Comments)    Unknown reaction   Ensure [Nutritional Supplements] Nausea And Vomiting   Gabapentin    Lunesta  [Eszopiclone]     Heart racing   Nepro [Compleat] Nausea And Vomiting   Penicillin V     Has patient had a PCN reaction causing immediate rash, facial/tongue/throat swelling, SOB or lightheadedness with hypotension: Yes Has patient had a PCN reaction causing severe rash involving mucus membranes or skin necrosis: No Has patient had a PCN reaction that required hospitalization No Has patient had a PCN reaction occurring within the last 10 years: No If all of the above answers are "NO", then may proceed with Cephalosporin use.   Sulfa Antibiotics     Unknown reaction    VITALS:  Blood  pressure 131/74, pulse 81, temperature 98.2 F (36.8 C), temperature source Oral, resp. rate 19, height 5\' 4"  (1.626 m), weight 56.4 kg, SpO2 94 %.  PHYSICAL EXAMINATION:   Physical Exam  GENERAL:  70 y.o.-year-old patient lying in the bed with no acute distress.  LUNGS: Normal breath sounds bilaterally, no wheezing, rales, rhonchi. No use of accessory muscles of respiration. HD cath+ CARDIOVASCULAR: S1, S2 normal. No murmurs, rubs, or gallops.  ABDOMEN: Soft, nontender, nondistended. Bowel sounds present. No organomegaly or mass.  EXTREMITIES: No cyanosis, clubbing or edema b/l.    NEUROLOGIC: non focal PSYCHIATRIC:  patient is alert and oriented x 3.  SKIN: No obvious rash, lesion, or ulcer.   LABORATORY PANEL:  CBC Recent Labs  Lab 11/12/20 0439  WBC 8.2  HGB 8.5*  HCT 26.3*  PLT 258    Chemistries  Recent Labs  Lab 11/12/20 0439  NA 129*  K 4.3  CL 97*  CO2 27  GLUCOSE 112*  BUN 20  CREATININE 2.81*  CALCIUM 8.3*  MG 1.7   Cardiac Enzymes No results for input(s): TROPONINI in the last 168 hours. RADIOLOGY:  No results found. ASSESSMENT AND PLAN:   Amy [name] is a 70 y.o. Caucasian female with medical history significant for end-stage renal disease on hemodialysis, anxiety, anemia, gastritis, GERD, multiple admissions with most recent for C. difficile colitis and non-STEMI status post PCI and recent acute on chronic SDH status post partial craniotomy and evacuation of SDH as well as hypertension, presenting to the emergency room with acute onset of nonfunctioning  right chest cath.  She has been having worsening bilateral lower and upper extremity edema.  She has 2 pillow orthopnea and admits to dry cough without wheezing.   Pulm edema, anasarca, fluid overlaod 2/2 Missing dialysis due to inability to access dialysis cath ESRD on HD MWF --Pt missed 2 HD sessions due to PermCath clogging --nephro was able to de-clog the PermCath initially, however, later pt  did need a PermCath exchange with vascular surgery. --cont IV lasix 60 mg BID --iHD per nephrology for fluid removal --7/12--PD cath removal by GenSurg Dr Lysle Pearl --7/13--some issues wit new HD cath. Per dr Juleen China they will attempt again in am. If working ok then can d/c to [facility] tomorrow   Chronic systolic CHF  - Her last EF was 25 to 30% with mild mitral regurgitation and trivial tricuspid regurgitation in February of this year. --unable to determine whether fluid overload was due to acute CHF or simply due to missing dialysis Plan: --cont IV lasix 60 mg BID --HD per nephrology for fluid removal --cont Lopressor   Hyponatremia, hypervolemia --treat with volume removal   Dyslipidemia. - continue statin therapy.    Seizure disorder. - cont Vimpat and Keppra   GERD. - continue PPI therapy.  Coronary artery disease. --cont statin   Chronic headache --chronic and long-standing --Fioricet PRN   Hypomag/hypokalemia --repleted    Anemia of chronic kidney disease:  --hemoglobin 8.1 this morning --Epo with dialysis     DVT prophylaxis: Heparin SQ Code Status: Full code  Family Communication:  Level of care: Med-Surg Dispo:   The patient is from: SNF Anticipated d/c is to: SNF Anticipated d/c date is: tomorrow 7/14 Patient currently is not medically ready to d/c due to: need nephrology clearance for discharge.      TOTAL TIME TAKING CARE OF THIS PATIENT: 25 minutes.  >50% time spent on counselling and coordination of care  Note: This dictation was prepared with Dragon dictation along with smaller phrase technology. Any transcriptional errors that result from this process are unintentional.  Amy [name] M.D    [facility]   CC: Primary care physician; Murlean Iba, MD Patient ID: Amy [name], female   DOB: 1950/10/14, 70 y.o.   MRN: [id number]

## 2020-11-13 DIAGNOSIS — G40909 Epilepsy, unspecified, not intractable, without status epilepticus: Secondary | ICD-10-CM | POA: Diagnosis present

## 2020-11-13 DIAGNOSIS — I10 Essential (primary) hypertension: Secondary | ICD-10-CM | POA: Diagnosis not present

## 2020-11-13 DIAGNOSIS — J810 Acute pulmonary edema: Secondary | ICD-10-CM

## 2020-11-13 DIAGNOSIS — Z9049 Acquired absence of other specified parts of digestive tract: Secondary | ICD-10-CM | POA: Diagnosis not present

## 2020-11-13 DIAGNOSIS — I255 Ischemic cardiomyopathy: Secondary | ICD-10-CM | POA: Diagnosis present

## 2020-11-13 DIAGNOSIS — F39 Unspecified mood [affective] disorder: Secondary | ICD-10-CM | POA: Diagnosis not present

## 2020-11-13 DIAGNOSIS — G4452 New daily persistent headache (NDPH): Secondary | ICD-10-CM | POA: Diagnosis not present

## 2020-11-13 DIAGNOSIS — D509 Iron deficiency anemia, unspecified: Secondary | ICD-10-CM | POA: Diagnosis not present

## 2020-11-13 DIAGNOSIS — S065X0A Traumatic subdural hemorrhage without loss of consciousness, initial encounter: Secondary | ICD-10-CM | POA: Diagnosis not present

## 2020-11-13 DIAGNOSIS — E876 Hypokalemia: Secondary | ICD-10-CM | POA: Diagnosis present

## 2020-11-13 DIAGNOSIS — I132 Hypertensive heart and chronic kidney disease with heart failure and with stage 5 chronic kidney disease, or end stage renal disease: Secondary | ICD-10-CM | POA: Diagnosis present

## 2020-11-13 DIAGNOSIS — I953 Hypotension of hemodialysis: Secondary | ICD-10-CM | POA: Diagnosis present

## 2020-11-13 DIAGNOSIS — F419 Anxiety disorder, unspecified: Secondary | ICD-10-CM | POA: Diagnosis not present

## 2020-11-13 DIAGNOSIS — N2581 Secondary hyperparathyroidism of renal origin: Secondary | ICD-10-CM | POA: Diagnosis present

## 2020-11-13 DIAGNOSIS — F32A Depression, unspecified: Secondary | ICD-10-CM | POA: Diagnosis present

## 2020-11-13 DIAGNOSIS — T829XXA Unspecified complication of cardiac and vascular prosthetic device, implant and graft, initial encounter: Secondary | ICD-10-CM | POA: Diagnosis not present

## 2020-11-13 DIAGNOSIS — R188 Other ascites: Secondary | ICD-10-CM | POA: Diagnosis present

## 2020-11-13 DIAGNOSIS — Z7401 Bed confinement status: Secondary | ICD-10-CM | POA: Diagnosis not present

## 2020-11-13 DIAGNOSIS — I259 Chronic ischemic heart disease, unspecified: Secondary | ICD-10-CM | POA: Diagnosis not present

## 2020-11-13 DIAGNOSIS — Z9889 Other specified postprocedural states: Secondary | ICD-10-CM | POA: Diagnosis not present

## 2020-11-13 DIAGNOSIS — R300 Dysuria: Secondary | ICD-10-CM | POA: Diagnosis not present

## 2020-11-13 DIAGNOSIS — I2510 Atherosclerotic heart disease of native coronary artery without angina pectoris: Secondary | ICD-10-CM | POA: Diagnosis present

## 2020-11-13 DIAGNOSIS — Z86010 Personal history of colonic polyps: Secondary | ICD-10-CM | POA: Diagnosis not present

## 2020-11-13 DIAGNOSIS — K5730 Diverticulosis of large intestine without perforation or abscess without bleeding: Secondary | ICD-10-CM | POA: Diagnosis present

## 2020-11-13 DIAGNOSIS — M6281 Muscle weakness (generalized): Secondary | ICD-10-CM | POA: Diagnosis not present

## 2020-11-13 DIAGNOSIS — D72829 Elevated white blood cell count, unspecified: Secondary | ICD-10-CM | POA: Diagnosis not present

## 2020-11-13 DIAGNOSIS — I120 Hypertensive chronic kidney disease with stage 5 chronic kidney disease or end stage renal disease: Secondary | ICD-10-CM | POA: Diagnosis not present

## 2020-11-13 DIAGNOSIS — Z20822 Contact with and (suspected) exposure to covid-19: Secondary | ICD-10-CM | POA: Diagnosis present

## 2020-11-13 DIAGNOSIS — R4020 Unspecified coma: Secondary | ICD-10-CM | POA: Diagnosis not present

## 2020-11-13 DIAGNOSIS — Z992 Dependence on renal dialysis: Secondary | ICD-10-CM | POA: Diagnosis not present

## 2020-11-13 DIAGNOSIS — I5020 Unspecified systolic (congestive) heart failure: Secondary | ICD-10-CM | POA: Diagnosis not present

## 2020-11-13 DIAGNOSIS — R278 Other lack of coordination: Secondary | ICD-10-CM | POA: Diagnosis not present

## 2020-11-13 DIAGNOSIS — K219 Gastro-esophageal reflux disease without esophagitis: Secondary | ICD-10-CM | POA: Diagnosis not present

## 2020-11-13 DIAGNOSIS — W19XXXD Unspecified fall, subsequent encounter: Secondary | ICD-10-CM | POA: Diagnosis not present

## 2020-11-13 DIAGNOSIS — I6200 Nontraumatic subdural hemorrhage, unspecified: Secondary | ICD-10-CM | POA: Diagnosis not present

## 2020-11-13 DIAGNOSIS — Z741 Need for assistance with personal care: Secondary | ICD-10-CM | POA: Diagnosis not present

## 2020-11-13 DIAGNOSIS — Z8249 Family history of ischemic heart disease and other diseases of the circulatory system: Secondary | ICD-10-CM | POA: Diagnosis not present

## 2020-11-13 DIAGNOSIS — R55 Syncope and collapse: Secondary | ICD-10-CM | POA: Diagnosis not present

## 2020-11-13 DIAGNOSIS — N183 Chronic kidney disease, stage 3 unspecified: Secondary | ICD-10-CM | POA: Diagnosis not present

## 2020-11-13 DIAGNOSIS — D638 Anemia in other chronic diseases classified elsewhere: Secondary | ICD-10-CM | POA: Diagnosis not present

## 2020-11-13 DIAGNOSIS — R4189 Other symptoms and signs involving cognitive functions and awareness: Secondary | ICD-10-CM | POA: Diagnosis not present

## 2020-11-13 DIAGNOSIS — D631 Anemia in chronic kidney disease: Secondary | ICD-10-CM | POA: Diagnosis present

## 2020-11-13 DIAGNOSIS — R2689 Other abnormalities of gait and mobility: Secondary | ICD-10-CM | POA: Diagnosis not present

## 2020-11-13 DIAGNOSIS — G479 Sleep disorder, unspecified: Secondary | ICD-10-CM | POA: Diagnosis not present

## 2020-11-13 DIAGNOSIS — S065X9D Traumatic subdural hemorrhage with loss of consciousness of unspecified duration, subsequent encounter: Secondary | ICD-10-CM | POA: Diagnosis not present

## 2020-11-13 DIAGNOSIS — N3001 Acute cystitis with hematuria: Secondary | ICD-10-CM | POA: Diagnosis present

## 2020-11-13 DIAGNOSIS — M2550 Pain in unspecified joint: Secondary | ICD-10-CM | POA: Diagnosis not present

## 2020-11-13 DIAGNOSIS — I700 Atherosclerosis of aorta: Secondary | ICD-10-CM | POA: Diagnosis not present

## 2020-11-13 DIAGNOSIS — G3184 Mild cognitive impairment, so stated: Secondary | ICD-10-CM | POA: Diagnosis not present

## 2020-11-13 DIAGNOSIS — Z66 Do not resuscitate: Secondary | ICD-10-CM | POA: Diagnosis present

## 2020-11-13 DIAGNOSIS — E785 Hyperlipidemia, unspecified: Secondary | ICD-10-CM | POA: Diagnosis present

## 2020-11-13 DIAGNOSIS — N186 End stage renal disease: Secondary | ICD-10-CM | POA: Diagnosis present

## 2020-11-13 DIAGNOSIS — A0472 Enterocolitis due to Clostridium difficile, not specified as recurrent: Secondary | ICD-10-CM | POA: Diagnosis not present

## 2020-11-13 DIAGNOSIS — S065X0D Traumatic subdural hemorrhage without loss of consciousness, subsequent encounter: Secondary | ICD-10-CM | POA: Diagnosis not present

## 2020-11-13 DIAGNOSIS — J9811 Atelectasis: Secondary | ICD-10-CM | POA: Diagnosis present

## 2020-11-13 DIAGNOSIS — I5042 Chronic combined systolic (congestive) and diastolic (congestive) heart failure: Secondary | ICD-10-CM | POA: Diagnosis present

## 2020-11-13 DIAGNOSIS — Z8619 Personal history of other infectious and parasitic diseases: Secondary | ICD-10-CM | POA: Diagnosis not present

## 2020-11-13 DIAGNOSIS — T829XXD Unspecified complication of cardiac and vascular prosthetic device, implant and graft, subsequent encounter: Secondary | ICD-10-CM | POA: Diagnosis not present

## 2020-11-13 DIAGNOSIS — R601 Generalized edema: Secondary | ICD-10-CM | POA: Diagnosis not present

## 2020-11-13 DIAGNOSIS — G319 Degenerative disease of nervous system, unspecified: Secondary | ICD-10-CM | POA: Diagnosis not present

## 2020-11-13 DIAGNOSIS — I5022 Chronic systolic (congestive) heart failure: Secondary | ICD-10-CM | POA: Diagnosis not present

## 2020-11-13 DIAGNOSIS — G40509 Epileptic seizures related to external causes, not intractable, without status epilepticus: Secondary | ICD-10-CM | POA: Diagnosis not present

## 2020-11-13 DIAGNOSIS — E871 Hypo-osmolality and hyponatremia: Secondary | ICD-10-CM | POA: Diagnosis present

## 2020-11-13 DIAGNOSIS — R609 Edema, unspecified: Secondary | ICD-10-CM | POA: Diagnosis not present

## 2020-11-13 DIAGNOSIS — J90 Pleural effusion, not elsewhere classified: Secondary | ICD-10-CM | POA: Diagnosis not present

## 2020-11-13 DIAGNOSIS — I248 Other forms of acute ischemic heart disease: Secondary | ICD-10-CM | POA: Diagnosis present

## 2020-11-13 LAB — BASIC METABOLIC PANEL
Anion gap: 6 (ref 5–15)
BUN: 20 mg/dL (ref 8–23)
CO2: 27 mmol/L (ref 22–32)
Calcium: 8 mg/dL — ABNORMAL LOW (ref 8.9–10.3)
Chloride: 94 mmol/L — ABNORMAL LOW (ref 98–111)
Creatinine, Ser: 2.86 mg/dL — ABNORMAL HIGH (ref 0.44–1.00)
GFR, Estimated: 17 mL/min — ABNORMAL LOW (ref >60)
Glucose, Bld: 97 mg/dL (ref 70–99)
Potassium: 3.9 mmol/L (ref 3.5–5.1)
Sodium: 127 mmol/L — ABNORMAL LOW (ref 135–145)

## 2020-11-13 LAB — CBC
HCT: 25.9 % — ABNORMAL LOW (ref 36.0–46.0)
Hemoglobin: 8.1 g/dL — ABNORMAL LOW (ref 12.0–15.0)
MCH: 27.1 pg (ref 26.0–34.0)
MCHC: 31.3 g/dL (ref 30.0–36.0)
MCV: 86.6 fL (ref 80.0–100.0)
Platelets: 272 10*3/uL (ref 150–400)
RBC: 2.99 MIL/uL — ABNORMAL LOW (ref 3.87–5.11)
RDW: 15.6 % — ABNORMAL HIGH (ref 11.5–15.5)
WBC: 6.6 10*3/uL (ref 4.0–10.5)
nRBC: 0 % (ref 0.0–0.2)

## 2020-11-13 LAB — MAGNESIUM: Magnesium: 1.5 mg/dL — ABNORMAL LOW (ref 1.7–2.4)

## 2020-11-13 MED ORDER — LEVETIRACETAM 500 MG PO TABS
500.0000 mg | ORAL_TABLET | Freq: Once | ORAL | Status: DC
  Filled 2020-11-13: qty 1

## 2020-11-13 MED ORDER — MAGNESIUM OXIDE -MG SUPPLEMENT 400 (240 MG) MG PO TABS
400.0000 mg | ORAL_TABLET | Freq: Two times a day (BID) | ORAL | Status: DC
  Administered 2020-11-13: 400 mg via ORAL
  Filled 2020-11-13 (×2): qty 1

## 2020-11-13 MED ORDER — ALTEPLASE 2 MG IJ SOLR
2.0000 mg | Freq: Once | INTRAMUSCULAR | Status: AC | PRN
  Administered 2020-11-13: 2 mg
  Filled 2020-11-13: qty 2

## 2020-11-13 NOTE — Discharge Summary (Signed)
[place] [facility] [place] NAME: Amy [name]    MR#:  [id number]  DATE OF BIRTH:  [birth year]  DATE OF ADMISSION:  11/07/2020 ADMITTING PHYSICIAN: Christel Mormon, MD  DATE OF DISCHARGE: 11/13/2020  PRIMARY CARE PHYSICIAN: Murlean Iba, MD    ADMISSION DIAGNOSIS:  Pulmonary edema [J81.1] ESRD (end stage renal disease) ([place]) [G26.9] Complication associated with dialysis catheter [T82.9XXA]  DISCHARGE DIAGNOSIS:  Acute pulmonary edema/fluid overload/HD Access malfunctioning-- improving  SECONDARY DIAGNOSIS:   Past Medical History:  Diagnosis Date   Anemia    a. 08/2016 Guaiac + stool. EGD w/ gastritis.   Anxiety    Closed fracture of shaft of left ulna 11/27/2018   Colon polyp    a. 08/2016 Colonoscopy.   ESRD on peritoneal dialysis ([place])    Gastritis    a. 08/2016 EGD: Gastritis-->PPI.   GERD (gastroesophageal reflux disease)    History of chicken pox    History of measles as a child    Hypertension    Hyponatremia    a. 08/2016 in setting of HCTZ Rx.   Leukocytosis    Mesenteric lymphadenopathy 04/08/2017   Multiple thyroid nodules     HOSPITAL COURSE:   Amy [name] is a 70 y.o. Caucasian female with medical history significant for end-stage renal disease on hemodialysis, anxiety, anemia, gastritis, GERD, multiple admissions with most recent for C. difficile colitis and non-STEMI status post PCI and recent acute on chronic SDH status post partial craniotomy and evacuation of SDH as well as hypertension, presenting to the emergency room with acute onset of nonfunctioning right chest cath.  She has been having worsening bilateral lower and upper extremity edema.  She has 2 pillow orthopnea and admits to dry cough without wheezing.   Pulm edema, anasarca, fluid overlaod 2/2 Missing dialysis due to inability to access dialysis cath ESRD on HD MWF --Pt missed 2 HD sessions due to PermCath clogging --nephro was able to de-clog the PermCath  initially, however, later pt did need a PermCath exchange with vascular surgery. --was on  IV lasix 60 mg BID  in house --iHD per nephrology for fluid removal --7/12--PD cath removal by GenSurg Dr Lysle Pearl --7/13--some issues wit new HD cath. Per dr Juleen China they will attempt again in am. If working ok then can d/c to [facility] tomorrow --7/14--HD going well per Dr Juleen China. Patient will resume her routine dialysis schedule Monday Wednesday Friday from tomorrow.   Chronic systolic CHF  - Her last EF was 25 to 30% with mild mitral regurgitation and trivial tricuspid regurgitation in February of this year. --unable to determine whether fluid overload was due to acute CHF or simply due to missing dialysis --HD per nephrology for fluid removal --cont Lopressor   Hyponatremia, hypervolemia --treat with volume removal    Dyslipidemia. - continue statin therapy.    Seizure disorder. - cont Vimpat and Keppra   GERD. - continue PPI therapy.  Coronary artery disease. --cont statin   Chronic headache --chronic and long-standing --Fioricet PRN   Hypomag/hypokalemia --repleted    Anemia of chronic kidney disease:  --hemoglobin 8.1 this morning --Epo with dialysis     DVT prophylaxis: Heparin SQ Code Status: Full code  Family Communication:  Level of care: Med-Surg Dispo:   The patient is from: SNF Anticipated d/c is to: SNF Anticipated d/c date is: today 7/14 Patient currently is medically  best optimized and okay to discharge to [facility] informed CONSULTS  OBTAINED:    DRUG ALLERGIES:   Allergies  Allergen Reactions   Cephalosporins Hives and Swelling   Clarithromycin Other (See Comments)    Unknown reaction   Ensure [Nutritional Supplements] Nausea And Vomiting   Gabapentin    Lunesta  [Eszopiclone]     Heart racing   Nepro [Compleat] Nausea And Vomiting   Penicillin V     Has patient had a PCN reaction causing immediate rash, facial/tongue/throat  swelling, SOB or lightheadedness with hypotension: Yes Has patient had a PCN reaction causing severe rash involving mucus membranes or skin necrosis: No Has patient had a PCN reaction that required hospitalization No Has patient had a PCN reaction occurring within the last 10 years: No If all of the above answers are "NO", then may proceed with Cephalosporin use.   Sulfa Antibiotics     Unknown reaction    DISCHARGE MEDICATIONS:   Allergies as of 11/13/2020       Reactions   Cephalosporins Hives, Swelling   Clarithromycin Other (See Comments)   Unknown reaction   Ensure [nutritional Supplements] Nausea And Vomiting   Gabapentin    Lunesta  [eszopiclone]    Heart racing   Nepro [compleat] Nausea And Vomiting   Penicillin V    Has patient had a PCN reaction causing immediate rash, facial/tongue/throat swelling, SOB or lightheadedness with hypotension: Yes Has patient had a PCN reaction causing severe rash involving mucus membranes or skin necrosis: No Has patient had a PCN reaction that required hospitalization No Has patient had a PCN reaction occurring within the last 10 years: No If all of the above answers are "NO", then may proceed with Cephalosporin use.   Sulfa Antibiotics    Unknown reaction        Medication List     TAKE these medications    acetaminophen 325 MG tablet Commonly known as: TYLENOL Take 1-2 tablets (325-650 mg total) by mouth every 4 (four) hours as needed for mild pain.   aspirin 81 MG chewable tablet Chew 1 tablet (81 mg total) by mouth daily.   atorvastatin 40 MG tablet Commonly known as: LIPITOR Take 1 tablet (40 mg total) by mouth daily. What changed: Another medication with the same name was removed. Continue taking this medication, and follow the directions you see here.   brivaracetam 25 MG Tabs tablet Commonly known as: BRIVIACT Take 3 tablets (75 mg total) by mouth 2 (two) times daily.   butalbital-acetaminophen-caffeine 50-325-40  MG tablet Commonly known as: FIORICET Take 1 tablet by mouth 3 (three) times daily as needed for headache.   camphor-menthol lotion Commonly known as: SARNA Apply topically as needed for itching.   Chlorhexidine Gluconate Cloth 2 % Pads Apply 6 each topically daily.   Darbepoetin Alfa 60 MCG/0.3ML Sosy injection Commonly known as: ARANESP Inject 0.3 mLs (60 mcg total) into the vein every Saturday with hemodialysis.   docusate sodium 100 MG capsule Commonly known as: COLACE Take 1 capsule (100 mg total) by mouth 2 (two) times daily.   gentamicin cream 0.1 % Commonly known as: GARAMYCIN Apply 1 application topically daily.   lacosamide 50 MG Tabs tablet Commonly known as: VIMPAT Take 1 tablet (50 mg total) by mouth 2 (two) times daily.   melatonin 5 MG Tabs Take 1 tablet (5 mg total) by mouth at bedtime. What changed: Another medication with the same name was removed. Continue taking this medication, and follow the directions you see here.   methocarbamol 500 MG tablet Commonly  known as: ROBAXIN Take 1 tablet (500 mg total) by mouth every 6 (six) hours as needed for muscle spasms.   metoCLOPramide 5 MG tablet Commonly known as: REGLAN Take 1 tablet (5 mg total) by mouth daily after breakfast.   metoprolol tartrate 25 mg/10 mL Susp Commonly known as: LOPRESSOR Take 2.5 mLs (6.25 mg total) by mouth 2 (two) times daily.   multivitamin Tabs tablet Take 1 tablet by mouth at bedtime.   pantoprazole 40 MG tablet Commonly known as: PROTONIX TAKE 1 TABLET (40 MG TOTAL) BY MOUTH DAILY.   prochlorperazine 10 MG tablet Commonly known as: COMPAZINE Take 1 tablet (10 mg total) by mouth every 8 (eight) hours as needed for nausea or vomiting.   traZODone 50 MG tablet Commonly known as: DESYREL Take 0.5-1 tablets (25-50 mg total) by mouth at bedtime as needed for sleep.   triamcinolone cream 0.1 % Commonly known as: KENALOG Apply topically 3 (three) times daily.         If you experience worsening of your admission symptoms, develop shortness of breath, life threatening emergency, suicidal or homicidal thoughts you must seek medical attention immediately by calling 911 or calling your MD immediately  if symptoms less severe.  You Must read complete instructions/literature along with all the possible adverse reactions/side effects for all the Medicines you take and that have been prescribed to you. Take any new Medicines after you have completely understood and accept all the possible adverse reactions/side effects.   Please note  You were cared for by a hospitalist during your hospital stay. If you have any questions about your discharge medications or the care you received while you were in the hospital after you are discharged, you can call the unit and asked to speak with the hospitalist on call if the hospitalist that took care of you is not available. Once you are discharged, your primary care physician will handle any further medical issues. Please note that NO REFILLS for any discharge medications will be authorized once you are discharged, as it is imperative that you return to your primary care physician (or establish a relationship with a primary care physician if you do not have one) for your aftercare needs so that they can reassess your need for medications and monitor your lab values. Today   SUBJECTIVE   No new complaints  VITAL SIGNS:  Blood pressure (!) 142/82, pulse 71, temperature 98.9 F (37.2 C), temperature source Oral, resp. rate 20, height 5\' 4"  (1.626 m), weight 56.2 kg, SpO2 98 %.  I/O:   Intake/Output Summary (Last 24 hours) at 11/13/2020 1428 Last data filed at 11/12/2020 1843 Gross per 24 hour  Intake 240 ml  Output 50 ml  Net 190 ml    PHYSICAL EXAMINATION:  GENERAL:  70 y.o.-year-old patient lying in the bed with no acute distress. LUNGS: Normal breath sounds bilaterally, no wheezing, rales, rhonchi. No use of  accessory muscles of respiration. HD cath+ CARDIOVASCULAR: S1, S2 normal. No murmurs, rubs, or gallops. ABDOMEN: Soft, nontender, nondistended. Bowel sounds present. No organomegaly or mass. EXTREMITIES: No cyanosis, clubbing or edema b/l.    NEUROLOGIC: non focal PSYCHIATRIC:  patient is alert and oriented x 3. SKIN: No obvious rash, lesion, or ulcer. DATA REVIEW:   CBC  Recent Labs  Lab 11/13/20 0519  WBC 6.6  HGB 8.1*  HCT 25.9*  PLT 272    Chemistries  Recent Labs  Lab 11/13/20 0519  NA 127*  K 3.9  CL 94*  CO2 27  GLUCOSE 97  BUN 20  CREATININE 2.86*  CALCIUM 8.0*  MG 1.5*    Microbiology Results   Recent Results (from the past 240 hour(s))  Resp Panel by RT-PCR (Flu A&B, Covid) Nasopharyngeal Swab     Status: None   Collection Time: 11/07/20  4:00 PM   Specimen: Nasopharyngeal Swab; Nasopharyngeal(NP) swabs in vial transport medium  Result Value Ref Range Status   SARS Coronavirus 2 by RT PCR NEGATIVE NEGATIVE Final    Comment: (NOTE) SARS-CoV-2 target nucleic acids are NOT DETECTED.  The SARS-CoV-2 RNA is generally detectable in upper respiratory specimens during the acute phase of infection. The lowest concentration of SARS-CoV-2 viral copies this assay can detect is 138 copies/mL. A negative result does not preclude SARS-Cov-2 infection and should not be used as the sole basis for treatment or other patient management decisions. A negative result may occur with  improper specimen collection/handling, submission of specimen other than nasopharyngeal swab, presence of viral mutation(s) within the areas targeted by this assay, and inadequate number of viral copies(<138 copies/mL). A negative result must be combined with clinical observations, patient history, and epidemiological information. The expected result is Negative.  Fact Sheet for Patients:  EntrepreneurPulse.com.au  Fact Sheet for Healthcare Providers:   IncredibleEmployment.be  This test is no t yet approved or cleared by the [place] FDA and  has been authorized for detection and/or diagnosis of SARS-CoV-2 by FDA under an Emergency Use Authorization (EUA). This EUA will remain  in effect (meaning this test can be used) for the duration of the COVID-19 declaration under Section 564(b)(1) of the Act, 21 U.S.C.section 360bbb-3(b)(1), unless the authorization is terminated  or revoked sooner.       Influenza A by PCR NEGATIVE NEGATIVE Final   Influenza B by PCR NEGATIVE NEGATIVE Final    Comment: (NOTE) The Xpert Xpress SARS-CoV-2/FLU/RSV plus assay is intended as an aid in the diagnosis of influenza from Nasopharyngeal swab specimens and should not be used as a sole basis for treatment. Nasal washings and aspirates are unacceptable for Xpert Xpress SARS-CoV-2/FLU/RSV testing.  Fact Sheet for Patients: EntrepreneurPulse.com.au  Fact Sheet for Healthcare Providers: IncredibleEmployment.be  This test is not yet approved or cleared by the [place] FDA and has been authorized for detection and/or diagnosis of SARS-CoV-2 by FDA under an Emergency Use Authorization (EUA). This EUA will remain in effect (meaning this test can be used) for the duration of the COVID-19 declaration under Section 564(b)(1) of the Act, 21 U.S.C. section 360bbb-3(b)(1), unless the authorization is terminated or revoked.  Performed at [facility], [address] [place], [place] [address]     RADIOLOGY:  No results found.   CODE STATUS:     Code Status Orders  (From admission, onward)           Start     Ordered   11/07/20 1610  Full code  Continuous        11/07/20 1613           Code Status History     Date Active Date Inactive Code Status Order ID Comments User Context   08/04/2020 1427 09/15/2020 2055 Full Code [id number]  Flora Lipps Inpatient    07/04/2020 1825 07/24/2020 1637 Full Code [id number]  Flora Lipps Inpatient   06/19/2020 1956 07/04/2020 1817 Full Code [id number]  Neena Rhymes, MD ED   05/29/2020 1852 05/31/2020 1845 Full Code [id number]  Cox, Briant Cedar,  DO ED   04/22/2020 2041 04/28/2020 1911 Full Code [id number]  Lequita Halt, MD ED   12/16/2019 1905 12/17/2019 2107 Full Code [id number]  Ivor Costa, MD ED   04/03/2019 1630 04/06/2019 1819 Full Code [id number]  Damita Lack, MD ED   06/19/2018 1317 06/22/2018 1851 Full Code [id number]  Salary, Avel Peace, MD Inpatient   09/24/2016 0049 09/27/2016 1657 Full Code [id number]  Cowlitz, [place], DO Inpatient   09/06/2016 0030 09/09/2016 1624 Full Code [id number]  Lance Coon, MD Inpatient        TOTAL TIME TAKING CARE OF THIS PATIENT: 35 minutes.    Fritzi Mandes M.D  [facility]    CC: Primary care physician; Murlean Iba, MD

## 2020-11-13 NOTE — Progress Notes (Signed)
OT Cancellation Note  Patient Details Name: FIA HEBERT MRN: [id number] DOB: Apr 14, 1951   Cancelled Treatment:    Reason Eval/Treat Not Completed: Patient at procedure or test/ unavailable. Pt at dialysis, unavailable for OT tx. Will re-attempt as appropriate at later date/time.   Hanley Hays, MPH, MS, OTR/L ascom [phone] 11/13/20, 1:25 PM

## 2020-11-13 NOTE — TOC Transition Note (Signed)
Transition of Care [facility]) - CM/SW Discharge Note   Patient Details  Name: Amy [name] MRN: [id number] Date of Birth: April 27, 1951  [facility] CM/SW Contact:  Candie Chroman, LCSW Phone Number: 11/13/2020, 4:38 PM   Clinical Narrative:   Patient has orders to discharge to [facility] today. RN will call report to [phone] (Room 102). EMS transport set up for 5:30. No further concerns. CSW signing off.  Final next level of care: Skilled Nursing Facility Barriers to Discharge: Barriers Resolved   Patient Goals and CMS Choice Patient states their goals for this hospitalization and ongoing recovery are:: SNF CMS Medicare.gov Compare Post Acute Care list provided to:: Patient Choice offered to / list presented to : Patient, Spouse  Discharge Placement   Existing PASRR number confirmed : 11/09/20          Patient chooses bed at: [facility] Patient to be transferred to facility by: EMS Name of family member notified: Amy [name] Patient and family notified of of transfer: 11/13/20  Discharge Plan and Services                                     Social Determinants of Health (SDOH) Interventions     Readmission Risk Interventions Readmission Risk Prevention Plan 11/09/2020 06/24/2020  Transportation Screening Complete Complete  PCP or Specialist Appt within 3-5 Days - Complete  HRI or [place] - Complete  Social Work Consult for [place] Planning/Counseling - Complete  Palliative Care Screening - Not Applicable  Medication Review Press photographer) Complete Complete  PCP or Specialist appointment within 3-5 days of discharge Complete -  [place] or Home Care Consult Complete -  SW Recovery Care/Counseling Consult Complete -  Palliative Care Screening Not Applicable -  Skilled Nursing Facility Complete -  Some recent data might be hidden

## 2020-11-13 NOTE — Progress Notes (Addendum)
[facility]  ROUNDING NOTE   Subjective:   Scheduled for dialysis today Due to sluggish catheter during treatment yesterday Cathflo instilled and will dwell for 2 hours prior to treatment  Patient seen resting in bed Alert and oriented, did not sleep well Tolerating meals, denies shortness of breath   Objective:  Vital signs in last 24 hours:  Temp:  [97.6 F (36.4 C)-99.3 F (37.4 C)] 97.6 F (36.4 C) (07/14 0817) Pulse Rate:  [71-81] 71 (07/14 0817) Resp:  [15-23] 15 (07/14 0414) BP: (103-154)/(59-79) 154/76 (07/14 0817) SpO2:  [94 %-98 %] 98 % (07/14 0817) Weight:  [56.2 kg] 56.2 kg (07/14 0412)  Weight change:  Filed Weights   11/09/20 1000 11/10/20 0336 11/13/20 0412  Weight: 57.4 kg 56.4 kg 56.2 kg    Intake/Output: I/O last 3 completed shifts: In: 840 [P.O.:840] Out: 485 [Urine:50; Other:435]   Intake/Output this shift:  No intake/output data recorded.  Physical Exam: General: NAD, laying in bed  Head: Normocephalic, atraumatic. Moist oral mucosal membranes  Eyes: Anicteric  Lungs:  Clear to auscultation, normal effort  Heart: Regular rate and rhythm  Abdomen:  Soft, tender, PD cath removed on 11/11/20  Extremities:  1+ peripheral edema.  Neurologic: Nonfocal, moving all four extremities  Skin: No lesions  Access: RIJ permcath, PD catheter removed on 29/79/89    Basic Metabolic Panel: Recent Labs  Lab 11/09/20 0421 11/10/20 0400 11/11/20 0510 11/12/20 0439 11/13/20 0519  NA 129* 128* 130* 129* 127*  K 3.4* 3.1* 3.1* 4.3 3.9  CL 98 97* 97* 97* 94*  CO2 28 27 28 27 27   GLUCOSE 94 91 90 112* 97  BUN 23 25* 17 20 20   CREATININE 2.39* 2.87* 2.32* 2.81* 2.86*  CALCIUM 7.5* 7.7* 8.0* 8.3* 8.0*  MG 1.5* 1.9 1.7 1.7 1.5*  PHOS  --  3.3  --   --   --      Liver Function Tests: No results for input(s): AST, ALT, ALKPHOS, BILITOT, PROT, ALBUMIN in the last 168 hours. No results for input(s): LIPASE, AMYLASE in the last 168  hours. No results for input(s): AMMONIA in the last 168 hours.  CBC: Recent Labs  Lab 11/09/20 0421 11/10/20 0400 11/11/20 0510 11/12/20 0439 11/13/20 0519  WBC 6.3 6.3 5.6 8.2 6.6  HGB 8.1* 8.3* 8.6* 8.5* 8.1*  HCT 25.0* 25.8* 26.6* 26.3* 25.9*  MCV 84.7 84.9 83.9 86.2 86.6  PLT 281 273 253 258 272     Cardiac Enzymes: No results for input(s): CKTOTAL, CKMB, CKMBINDEX, TROPONINI in the last 168 hours.  BNP: Invalid input(s): POCBNP  CBG: No results for input(s): GLUCAP in the last 168 hours.  Microbiology: Results for orders placed or performed during the hospital encounter of 11/07/20  Resp Panel by RT-PCR (Flu A&B, Covid) Nasopharyngeal Swab     Status: None   Collection Time: 11/07/20  4:00 PM   Specimen: Nasopharyngeal Swab; Nasopharyngeal(NP) swabs in vial transport medium  Result Value Ref Range Status   SARS Coronavirus 2 by RT PCR NEGATIVE NEGATIVE Final    Comment: (NOTE) SARS-CoV-2 target nucleic acids are NOT DETECTED.  The SARS-CoV-2 RNA is generally detectable in upper respiratory specimens during the acute phase of infection. The lowest concentration of SARS-CoV-2 viral copies this assay can detect is 138 copies/mL. A negative result does not preclude SARS-Cov-2 infection and should not be used as the sole basis for treatment or other patient management decisions. A negative result may occur with  improper  specimen collection/handling, submission of specimen other than nasopharyngeal swab, presence of viral mutation(s) within the areas targeted by this assay, and inadequate number of viral copies(<138 copies/mL). A negative result must be combined with clinical observations, patient history, and epidemiological information. The expected result is Negative.  Fact Sheet for Patients:  EntrepreneurPulse.com.au  Fact Sheet for Healthcare Providers:  IncredibleEmployment.be  This test is no t yet approved or cleared  by the [place] FDA and  has been authorized for detection and/or diagnosis of SARS-CoV-2 by FDA under an Emergency Use Authorization (EUA). This EUA will remain  in effect (meaning this test can be used) for the duration of the COVID-19 declaration under Section 564(b)(1) of the Act, 21 U.S.C.section 360bbb-3(b)(1), unless the authorization is terminated  or revoked sooner.       Influenza A by PCR NEGATIVE NEGATIVE Final   Influenza B by PCR NEGATIVE NEGATIVE Final    Comment: (NOTE) The Xpert Xpress SARS-CoV-2/FLU/RSV plus assay is intended as an aid in the diagnosis of influenza from Nasopharyngeal swab specimens and should not be used as a sole basis for treatment. Nasal washings and aspirates are unacceptable for Xpert Xpress SARS-CoV-2/FLU/RSV testing.  Fact Sheet for Patients: EntrepreneurPulse.com.au  Fact Sheet for Healthcare Providers: IncredibleEmployment.be  This test is not yet approved or cleared by the [place] FDA and has been authorized for detection and/or diagnosis of SARS-CoV-2 by FDA under an Emergency Use Authorization (EUA). This EUA will remain in effect (meaning this test can be used) for the duration of the COVID-19 declaration under Section 564(b)(1) of the Act, 21 U.S.C. section 360bbb-3(b)(1), unless the authorization is terminated or revoked.  Performed at [facility], [address] [place], [place] [address]     Coagulation Studies: No results for input(s): LABPROT, INR in the last 72 hours.  Urinalysis: No results for input(s): COLORURINE, LABSPEC, PHURINE, GLUCOSEU, HGBUR, BILIRUBINUR, KETONESUR, PROTEINUR, UROBILINOGEN, NITRITE, LEUKOCYTESUR in the last 72 hours.  Invalid input(s): APPERANCEUR    Imaging: No results found.   Medications:      atorvastatin  40 mg Oral Daily   Chlorhexidine Gluconate Cloth  6 each Topical Daily   docusate sodium  100 mg Oral BID    furosemide  60 mg Intravenous Q12H   heparin injection (subcutaneous)  5,000 Units Subcutaneous Q8H   lacosamide  50 mg Oral BID   levETIRAcetam  500 mg Oral QHS   levETIRAcetam  500 mg Oral Q M,W,F   melatonin  5 mg Oral QHS   metoCLOPramide  5 mg Oral QPC breakfast   metoprolol tartrate  6.25 mg Oral BID   multivitamin  1 tablet Oral QHS   pantoprazole  40 mg Oral Daily   acetaminophen **OR** acetaminophen, butalbital-acetaminophen-caffeine, camphor-menthol, HYDROmorphone (DILAUDID) injection, magnesium hydroxide, methocarbamol, ondansetron **OR** ondansetron (ZOFRAN) IV, ondansetron (ZOFRAN) IV, prochlorperazine, traZODone  Assessment/ Plan:  [name] is a 70 y.o. white female with end stage renal disease on hemodialysis, hypertension, hyperlipidemia, seizure disorder, GERD, anxiety who is admitted to [facility] on 11/07/2020 for Pulmonary edema [J81.1] ESRD (end stage renal disease) ([place]) [E33.2] Complication associated with dialysis catheter [T82.9XXA]  Hemodialysis: MWF [facility] RIJ permcath 53 kg  End stage renal disease: on back up hemodialysis.  - Recevied dialysis yesterday, treatment terminated early due to elevated pressures in catheter. - scheduled for short treatment today after 2 hour cathflo dwell  - If discharged, pat may continue treatments at outpatient clinic  Hypertension: 154 /76. Continue on  metoprolol and furosemide.   Anemia of chronic kidney disease: hemoglobin 8.1. Aranesp given on dialysis treatment 7/9. Gets EPO with each treatment as outpatient    Secondary Hyperparathyroidism: currently not on binders.  Hyponatremia: improving with hemodialysis. Related to volume overload.  Current level 127. Fluid restriction of 1229ml daily. This will correct with dialysis   LOS: 6   7/14/202210:35 AM

## 2020-11-13 NOTE — Progress Notes (Signed)
PT Cancellation Note  Patient Details Name: OTHELL DILUZIO MRN: [id number] DOB: 1950/09/13   Cancelled Treatment:    Reason Eval/Treat Not Completed: Patient at procedure or test/unavailable. Patient off the floor for hemodialysis. PT will continue with attempts as appropriate.   Minna Merritts, PT, MPT  Percell Locus 11/13/2020, 1:42 PM

## 2020-11-13 NOTE — Care Management Important Message (Signed)
Important Message  Patient Details  Name: Amy [name] MRN: [id number] Date of Birth: 10/26/1950   Medicare Important Message Given:  Yes  Patient out of room at time of visit.  Confirmed copy of Medicare IM still on windowsill.    [name] 11/13/2020, 12:52 PM

## 2020-11-14 DIAGNOSIS — N186 End stage renal disease: Secondary | ICD-10-CM | POA: Diagnosis not present

## 2020-11-14 DIAGNOSIS — K219 Gastro-esophageal reflux disease without esophagitis: Secondary | ICD-10-CM | POA: Diagnosis not present

## 2020-11-14 DIAGNOSIS — E785 Hyperlipidemia, unspecified: Secondary | ICD-10-CM | POA: Diagnosis not present

## 2020-11-14 DIAGNOSIS — F39 Unspecified mood [affective] disorder: Secondary | ICD-10-CM | POA: Diagnosis not present

## 2020-11-14 DIAGNOSIS — D631 Anemia in chronic kidney disease: Secondary | ICD-10-CM | POA: Diagnosis not present

## 2020-11-14 DIAGNOSIS — S065X0A Traumatic subdural hemorrhage without loss of consciousness, initial encounter: Secondary | ICD-10-CM | POA: Diagnosis not present

## 2020-11-14 DIAGNOSIS — G4452 New daily persistent headache (NDPH): Secondary | ICD-10-CM | POA: Diagnosis not present

## 2020-11-14 DIAGNOSIS — N2581 Secondary hyperparathyroidism of renal origin: Secondary | ICD-10-CM | POA: Diagnosis not present

## 2020-11-14 DIAGNOSIS — G40909 Epilepsy, unspecified, not intractable, without status epilepticus: Secondary | ICD-10-CM | POA: Diagnosis not present

## 2020-11-14 DIAGNOSIS — Z992 Dependence on renal dialysis: Secondary | ICD-10-CM | POA: Diagnosis not present

## 2020-11-14 DIAGNOSIS — D509 Iron deficiency anemia, unspecified: Secondary | ICD-10-CM | POA: Diagnosis not present

## 2020-11-14 DIAGNOSIS — I2510 Atherosclerotic heart disease of native coronary artery without angina pectoris: Secondary | ICD-10-CM | POA: Diagnosis not present

## 2020-11-14 DIAGNOSIS — I10 Essential (primary) hypertension: Secondary | ICD-10-CM | POA: Diagnosis not present

## 2020-11-17 DIAGNOSIS — N2581 Secondary hyperparathyroidism of renal origin: Secondary | ICD-10-CM | POA: Diagnosis not present

## 2020-11-17 DIAGNOSIS — Z992 Dependence on renal dialysis: Secondary | ICD-10-CM | POA: Diagnosis not present

## 2020-11-17 DIAGNOSIS — N186 End stage renal disease: Secondary | ICD-10-CM | POA: Diagnosis not present

## 2020-11-17 DIAGNOSIS — D509 Iron deficiency anemia, unspecified: Secondary | ICD-10-CM | POA: Diagnosis not present

## 2020-11-17 DIAGNOSIS — D631 Anemia in chronic kidney disease: Secondary | ICD-10-CM | POA: Diagnosis not present

## 2020-11-19 DIAGNOSIS — N186 End stage renal disease: Secondary | ICD-10-CM | POA: Diagnosis not present

## 2020-11-19 DIAGNOSIS — N2581 Secondary hyperparathyroidism of renal origin: Secondary | ICD-10-CM | POA: Diagnosis not present

## 2020-11-19 DIAGNOSIS — D509 Iron deficiency anemia, unspecified: Secondary | ICD-10-CM | POA: Diagnosis not present

## 2020-11-19 DIAGNOSIS — D631 Anemia in chronic kidney disease: Secondary | ICD-10-CM | POA: Diagnosis not present

## 2020-11-19 DIAGNOSIS — Z992 Dependence on renal dialysis: Secondary | ICD-10-CM | POA: Diagnosis not present

## 2020-11-21 DIAGNOSIS — D509 Iron deficiency anemia, unspecified: Secondary | ICD-10-CM | POA: Diagnosis not present

## 2020-11-21 DIAGNOSIS — Z992 Dependence on renal dialysis: Secondary | ICD-10-CM | POA: Diagnosis not present

## 2020-11-21 DIAGNOSIS — D631 Anemia in chronic kidney disease: Secondary | ICD-10-CM | POA: Diagnosis not present

## 2020-11-21 DIAGNOSIS — I5020 Unspecified systolic (congestive) heart failure: Secondary | ICD-10-CM | POA: Diagnosis not present

## 2020-11-21 DIAGNOSIS — N183 Chronic kidney disease, stage 3 unspecified: Secondary | ICD-10-CM | POA: Diagnosis not present

## 2020-11-21 DIAGNOSIS — N186 End stage renal disease: Secondary | ICD-10-CM | POA: Diagnosis not present

## 2020-11-21 DIAGNOSIS — N2581 Secondary hyperparathyroidism of renal origin: Secondary | ICD-10-CM | POA: Diagnosis not present

## 2020-11-24 DIAGNOSIS — N186 End stage renal disease: Secondary | ICD-10-CM | POA: Diagnosis not present

## 2020-11-24 DIAGNOSIS — I259 Chronic ischemic heart disease, unspecified: Secondary | ICD-10-CM | POA: Diagnosis not present

## 2020-11-24 DIAGNOSIS — Z992 Dependence on renal dialysis: Secondary | ICD-10-CM | POA: Diagnosis not present

## 2020-11-24 DIAGNOSIS — D631 Anemia in chronic kidney disease: Secondary | ICD-10-CM | POA: Diagnosis not present

## 2020-11-24 DIAGNOSIS — N2581 Secondary hyperparathyroidism of renal origin: Secondary | ICD-10-CM | POA: Diagnosis not present

## 2020-11-24 DIAGNOSIS — D509 Iron deficiency anemia, unspecified: Secondary | ICD-10-CM | POA: Diagnosis not present

## 2020-11-28 DIAGNOSIS — N186 End stage renal disease: Secondary | ICD-10-CM | POA: Diagnosis not present

## 2020-11-28 DIAGNOSIS — D631 Anemia in chronic kidney disease: Secondary | ICD-10-CM | POA: Diagnosis not present

## 2020-11-28 DIAGNOSIS — D509 Iron deficiency anemia, unspecified: Secondary | ICD-10-CM | POA: Diagnosis not present

## 2020-11-28 DIAGNOSIS — N2581 Secondary hyperparathyroidism of renal origin: Secondary | ICD-10-CM | POA: Diagnosis not present

## 2020-11-28 DIAGNOSIS — Z992 Dependence on renal dialysis: Secondary | ICD-10-CM | POA: Diagnosis not present

## 2020-11-30 DIAGNOSIS — Z992 Dependence on renal dialysis: Secondary | ICD-10-CM | POA: Diagnosis not present

## 2020-11-30 DIAGNOSIS — N186 End stage renal disease: Secondary | ICD-10-CM | POA: Diagnosis not present

## 2020-12-01 DIAGNOSIS — D631 Anemia in chronic kidney disease: Secondary | ICD-10-CM | POA: Diagnosis not present

## 2020-12-01 DIAGNOSIS — D509 Iron deficiency anemia, unspecified: Secondary | ICD-10-CM | POA: Diagnosis not present

## 2020-12-01 DIAGNOSIS — Z992 Dependence on renal dialysis: Secondary | ICD-10-CM | POA: Diagnosis not present

## 2020-12-01 DIAGNOSIS — N186 End stage renal disease: Secondary | ICD-10-CM | POA: Diagnosis not present

## 2020-12-01 DIAGNOSIS — N2581 Secondary hyperparathyroidism of renal origin: Secondary | ICD-10-CM | POA: Diagnosis not present

## 2020-12-02 DIAGNOSIS — G479 Sleep disorder, unspecified: Secondary | ICD-10-CM | POA: Diagnosis not present

## 2020-12-03 DIAGNOSIS — Z992 Dependence on renal dialysis: Secondary | ICD-10-CM | POA: Diagnosis not present

## 2020-12-03 DIAGNOSIS — D631 Anemia in chronic kidney disease: Secondary | ICD-10-CM | POA: Diagnosis not present

## 2020-12-03 DIAGNOSIS — N186 End stage renal disease: Secondary | ICD-10-CM | POA: Diagnosis not present

## 2020-12-03 DIAGNOSIS — N2581 Secondary hyperparathyroidism of renal origin: Secondary | ICD-10-CM | POA: Diagnosis not present

## 2020-12-03 DIAGNOSIS — D509 Iron deficiency anemia, unspecified: Secondary | ICD-10-CM | POA: Diagnosis not present

## 2020-12-05 DIAGNOSIS — D509 Iron deficiency anemia, unspecified: Secondary | ICD-10-CM | POA: Diagnosis not present

## 2020-12-05 DIAGNOSIS — D631 Anemia in chronic kidney disease: Secondary | ICD-10-CM | POA: Diagnosis not present

## 2020-12-05 DIAGNOSIS — N2581 Secondary hyperparathyroidism of renal origin: Secondary | ICD-10-CM | POA: Diagnosis not present

## 2020-12-05 DIAGNOSIS — Z992 Dependence on renal dialysis: Secondary | ICD-10-CM | POA: Diagnosis not present

## 2020-12-05 DIAGNOSIS — N186 End stage renal disease: Secondary | ICD-10-CM | POA: Diagnosis not present

## 2020-12-08 DIAGNOSIS — Z992 Dependence on renal dialysis: Secondary | ICD-10-CM | POA: Diagnosis not present

## 2020-12-08 DIAGNOSIS — N186 End stage renal disease: Secondary | ICD-10-CM | POA: Diagnosis not present

## 2020-12-08 DIAGNOSIS — D631 Anemia in chronic kidney disease: Secondary | ICD-10-CM | POA: Diagnosis not present

## 2020-12-08 DIAGNOSIS — N2581 Secondary hyperparathyroidism of renal origin: Secondary | ICD-10-CM | POA: Diagnosis not present

## 2020-12-08 DIAGNOSIS — D509 Iron deficiency anemia, unspecified: Secondary | ICD-10-CM | POA: Diagnosis not present

## 2020-12-10 DIAGNOSIS — D509 Iron deficiency anemia, unspecified: Secondary | ICD-10-CM | POA: Diagnosis not present

## 2020-12-10 DIAGNOSIS — D631 Anemia in chronic kidney disease: Secondary | ICD-10-CM | POA: Diagnosis not present

## 2020-12-10 DIAGNOSIS — N186 End stage renal disease: Secondary | ICD-10-CM | POA: Diagnosis not present

## 2020-12-10 DIAGNOSIS — Z992 Dependence on renal dialysis: Secondary | ICD-10-CM | POA: Diagnosis not present

## 2020-12-10 DIAGNOSIS — N2581 Secondary hyperparathyroidism of renal origin: Secondary | ICD-10-CM | POA: Diagnosis not present

## 2020-12-12 DIAGNOSIS — D631 Anemia in chronic kidney disease: Secondary | ICD-10-CM | POA: Diagnosis not present

## 2020-12-12 DIAGNOSIS — D509 Iron deficiency anemia, unspecified: Secondary | ICD-10-CM | POA: Diagnosis not present

## 2020-12-12 DIAGNOSIS — N2581 Secondary hyperparathyroidism of renal origin: Secondary | ICD-10-CM | POA: Diagnosis not present

## 2020-12-12 DIAGNOSIS — N186 End stage renal disease: Secondary | ICD-10-CM | POA: Diagnosis not present

## 2020-12-12 DIAGNOSIS — Z992 Dependence on renal dialysis: Secondary | ICD-10-CM | POA: Diagnosis not present

## 2020-12-15 ENCOUNTER — Ambulatory Visit: Payer: Medicare Other | Admitting: Cardiology

## 2020-12-15 DIAGNOSIS — N186 End stage renal disease: Secondary | ICD-10-CM | POA: Diagnosis not present

## 2020-12-15 DIAGNOSIS — N2581 Secondary hyperparathyroidism of renal origin: Secondary | ICD-10-CM | POA: Diagnosis not present

## 2020-12-15 DIAGNOSIS — Z992 Dependence on renal dialysis: Secondary | ICD-10-CM | POA: Diagnosis not present

## 2020-12-15 DIAGNOSIS — D631 Anemia in chronic kidney disease: Secondary | ICD-10-CM | POA: Diagnosis not present

## 2020-12-15 DIAGNOSIS — D509 Iron deficiency anemia, unspecified: Secondary | ICD-10-CM | POA: Diagnosis not present

## 2020-12-17 DIAGNOSIS — N2581 Secondary hyperparathyroidism of renal origin: Secondary | ICD-10-CM | POA: Diagnosis not present

## 2020-12-17 DIAGNOSIS — D509 Iron deficiency anemia, unspecified: Secondary | ICD-10-CM | POA: Diagnosis not present

## 2020-12-17 DIAGNOSIS — D631 Anemia in chronic kidney disease: Secondary | ICD-10-CM | POA: Diagnosis not present

## 2020-12-17 DIAGNOSIS — Z992 Dependence on renal dialysis: Secondary | ICD-10-CM | POA: Diagnosis not present

## 2020-12-17 DIAGNOSIS — N186 End stage renal disease: Secondary | ICD-10-CM | POA: Diagnosis not present

## 2020-12-18 DIAGNOSIS — I10 Essential (primary) hypertension: Secondary | ICD-10-CM | POA: Diagnosis not present

## 2020-12-18 DIAGNOSIS — K219 Gastro-esophageal reflux disease without esophagitis: Secondary | ICD-10-CM | POA: Diagnosis not present

## 2020-12-18 DIAGNOSIS — Z992 Dependence on renal dialysis: Secondary | ICD-10-CM | POA: Diagnosis not present

## 2020-12-18 DIAGNOSIS — G40509 Epileptic seizures related to external causes, not intractable, without status epilepticus: Secondary | ICD-10-CM | POA: Diagnosis not present

## 2020-12-18 DIAGNOSIS — F39 Unspecified mood [affective] disorder: Secondary | ICD-10-CM | POA: Diagnosis not present

## 2020-12-18 DIAGNOSIS — G3184 Mild cognitive impairment, so stated: Secondary | ICD-10-CM | POA: Diagnosis not present

## 2020-12-19 DIAGNOSIS — D509 Iron deficiency anemia, unspecified: Secondary | ICD-10-CM | POA: Diagnosis not present

## 2020-12-19 DIAGNOSIS — N2581 Secondary hyperparathyroidism of renal origin: Secondary | ICD-10-CM | POA: Diagnosis not present

## 2020-12-19 DIAGNOSIS — D631 Anemia in chronic kidney disease: Secondary | ICD-10-CM | POA: Diagnosis not present

## 2020-12-19 DIAGNOSIS — N186 End stage renal disease: Secondary | ICD-10-CM | POA: Diagnosis not present

## 2020-12-19 DIAGNOSIS — Z992 Dependence on renal dialysis: Secondary | ICD-10-CM | POA: Diagnosis not present

## 2020-12-22 ENCOUNTER — Other Ambulatory Visit: Payer: Self-pay

## 2020-12-22 ENCOUNTER — Emergency Department: Payer: Medicare Other

## 2020-12-22 ENCOUNTER — Inpatient Hospital Stay
Admission: EM | Admit: 2020-12-22 | Discharge: 2020-12-25 | DRG: 312 | Disposition: A | Discharge status: Skilled Nursing Facility | Payer: Medicare Other | Source: Ambulatory Visit | Attending: Internal Medicine | Admitting: Internal Medicine

## 2020-12-22 DIAGNOSIS — R55 Syncope and collapse: Secondary | ICD-10-CM

## 2020-12-22 DIAGNOSIS — Z66 Do not resuscitate: Secondary | ICD-10-CM | POA: Diagnosis not present

## 2020-12-22 DIAGNOSIS — I6200 Nontraumatic subdural hemorrhage, unspecified: Secondary | ICD-10-CM | POA: Diagnosis not present

## 2020-12-22 DIAGNOSIS — R188 Other ascites: Secondary | ICD-10-CM | POA: Diagnosis not present

## 2020-12-22 DIAGNOSIS — F419 Anxiety disorder, unspecified: Secondary | ICD-10-CM | POA: Diagnosis present

## 2020-12-22 DIAGNOSIS — Z8249 Family history of ischemic heart disease and other diseases of the circulatory system: Secondary | ICD-10-CM

## 2020-12-22 DIAGNOSIS — Z79899 Other long term (current) drug therapy: Secondary | ICD-10-CM

## 2020-12-22 DIAGNOSIS — N3000 Acute cystitis without hematuria: Secondary | ICD-10-CM | POA: Diagnosis present

## 2020-12-22 DIAGNOSIS — Z20822 Contact with and (suspected) exposure to covid-19: Secondary | ICD-10-CM | POA: Diagnosis not present

## 2020-12-22 DIAGNOSIS — F32A Depression, unspecified: Secondary | ICD-10-CM | POA: Diagnosis present

## 2020-12-22 DIAGNOSIS — E871 Hypo-osmolality and hyponatremia: Secondary | ICD-10-CM | POA: Diagnosis present

## 2020-12-22 DIAGNOSIS — Z7982 Long term (current) use of aspirin: Secondary | ICD-10-CM

## 2020-12-22 DIAGNOSIS — K219 Gastro-esophageal reflux disease without esophagitis: Secondary | ICD-10-CM | POA: Diagnosis present

## 2020-12-22 DIAGNOSIS — Z881 Allergy status to other antibiotic agents status: Secondary | ICD-10-CM

## 2020-12-22 DIAGNOSIS — Z9049 Acquired absence of other specified parts of digestive tract: Secondary | ICD-10-CM | POA: Diagnosis not present

## 2020-12-22 DIAGNOSIS — Z90710 Acquired absence of both cervix and uterus: Secondary | ICD-10-CM

## 2020-12-22 DIAGNOSIS — D631 Anemia in chronic kidney disease: Secondary | ICD-10-CM | POA: Diagnosis not present

## 2020-12-22 DIAGNOSIS — E785 Hyperlipidemia, unspecified: Secondary | ICD-10-CM | POA: Diagnosis present

## 2020-12-22 DIAGNOSIS — I132 Hypertensive heart and chronic kidney disease with heart failure and with stage 5 chronic kidney disease, or end stage renal disease: Secondary | ICD-10-CM | POA: Diagnosis not present

## 2020-12-22 DIAGNOSIS — Z992 Dependence on renal dialysis: Secondary | ICD-10-CM

## 2020-12-22 DIAGNOSIS — Z880 Allergy status to penicillin: Secondary | ICD-10-CM

## 2020-12-22 DIAGNOSIS — Z86010 Personal history of colonic polyps: Secondary | ICD-10-CM

## 2020-12-22 DIAGNOSIS — N186 End stage renal disease: Secondary | ICD-10-CM

## 2020-12-22 DIAGNOSIS — I953 Hypotension of hemodialysis: Secondary | ICD-10-CM | POA: Diagnosis not present

## 2020-12-22 DIAGNOSIS — G319 Degenerative disease of nervous system, unspecified: Secondary | ICD-10-CM | POA: Diagnosis not present

## 2020-12-22 DIAGNOSIS — J9811 Atelectasis: Secondary | ICD-10-CM | POA: Diagnosis present

## 2020-12-22 DIAGNOSIS — Z888 Allergy status to other drugs, medicaments and biological substances status: Secondary | ICD-10-CM

## 2020-12-22 DIAGNOSIS — D72829 Elevated white blood cell count, unspecified: Secondary | ICD-10-CM

## 2020-12-22 DIAGNOSIS — G40909 Epilepsy, unspecified, not intractable, without status epilepticus: Secondary | ICD-10-CM

## 2020-12-22 DIAGNOSIS — Z8619 Personal history of other infectious and parasitic diseases: Secondary | ICD-10-CM

## 2020-12-22 DIAGNOSIS — D509 Iron deficiency anemia, unspecified: Secondary | ICD-10-CM | POA: Diagnosis not present

## 2020-12-22 DIAGNOSIS — I255 Ischemic cardiomyopathy: Secondary | ICD-10-CM | POA: Diagnosis present

## 2020-12-22 DIAGNOSIS — I120 Hypertensive chronic kidney disease with stage 5 chronic kidney disease or end stage renal disease: Secondary | ICD-10-CM | POA: Diagnosis not present

## 2020-12-22 DIAGNOSIS — I248 Other forms of acute ischemic heart disease: Secondary | ICD-10-CM | POA: Diagnosis present

## 2020-12-22 DIAGNOSIS — I2510 Atherosclerotic heart disease of native coronary artery without angina pectoris: Secondary | ICD-10-CM | POA: Diagnosis present

## 2020-12-22 DIAGNOSIS — K5730 Diverticulosis of large intestine without perforation or abscess without bleeding: Secondary | ICD-10-CM | POA: Diagnosis not present

## 2020-12-22 DIAGNOSIS — I700 Atherosclerosis of aorta: Secondary | ICD-10-CM | POA: Diagnosis not present

## 2020-12-22 DIAGNOSIS — Z9889 Other specified postprocedural states: Secondary | ICD-10-CM | POA: Diagnosis not present

## 2020-12-22 DIAGNOSIS — Z955 Presence of coronary angioplasty implant and graft: Secondary | ICD-10-CM

## 2020-12-22 DIAGNOSIS — I5042 Chronic combined systolic (congestive) and diastolic (congestive) heart failure: Secondary | ICD-10-CM | POA: Diagnosis present

## 2020-12-22 DIAGNOSIS — N2581 Secondary hyperparathyroidism of renal origin: Secondary | ICD-10-CM | POA: Diagnosis not present

## 2020-12-22 DIAGNOSIS — K2970 Gastritis, unspecified, without bleeding: Secondary | ICD-10-CM | POA: Diagnosis present

## 2020-12-22 DIAGNOSIS — M7989 Other specified soft tissue disorders: Secondary | ICD-10-CM | POA: Diagnosis present

## 2020-12-22 DIAGNOSIS — I5022 Chronic systolic (congestive) heart failure: Secondary | ICD-10-CM

## 2020-12-22 DIAGNOSIS — Z91018 Allergy to other foods: Secondary | ICD-10-CM

## 2020-12-22 DIAGNOSIS — E876 Hypokalemia: Secondary | ICD-10-CM | POA: Diagnosis present

## 2020-12-22 DIAGNOSIS — Z882 Allergy status to sulfonamides status: Secondary | ICD-10-CM

## 2020-12-22 DIAGNOSIS — J90 Pleural effusion, not elsewhere classified: Secondary | ICD-10-CM | POA: Diagnosis not present

## 2020-12-22 DIAGNOSIS — Z8679 Personal history of other diseases of the circulatory system: Secondary | ICD-10-CM

## 2020-12-22 DIAGNOSIS — N3001 Acute cystitis with hematuria: Secondary | ICD-10-CM | POA: Diagnosis present

## 2020-12-22 LAB — CBC WITH DIFFERENTIAL/PLATELET
Abs Immature Granulocytes: 0.73 10*3/uL — ABNORMAL HIGH (ref 0.00–0.07)
Basophils Absolute: 0.2 10*3/uL — ABNORMAL HIGH (ref 0.0–0.1)
Basophils Relative: 0 %
Eosinophils Absolute: 0 10*3/uL (ref 0.0–0.5)
Eosinophils Relative: 0 %
HCT: 42.3 % (ref 36.0–46.0)
Hemoglobin: 13.1 g/dL (ref 12.0–15.0)
Immature Granulocytes: 2 %
Lymphocytes Relative: 1 %
Lymphs Abs: 0.6 10*3/uL — ABNORMAL LOW (ref 0.7–4.0)
MCH: 27.8 pg (ref 26.0–34.0)
MCHC: 31 g/dL (ref 30.0–36.0)
MCV: 89.8 fL (ref 80.0–100.0)
Monocytes Absolute: 1.9 10*3/uL — ABNORMAL HIGH (ref 0.1–1.0)
Monocytes Relative: 4 %
Neutro Abs: 44.4 10*3/uL — ABNORMAL HIGH (ref 1.7–7.7)
Neutrophils Relative %: 93 %
Platelets: 292 10*3/uL (ref 150–400)
RBC: 4.71 MIL/uL (ref 3.87–5.11)
RDW: 20 % — ABNORMAL HIGH (ref 11.5–15.5)
Smear Review: ADEQUATE
WBC: 47.8 10*3/uL — ABNORMAL HIGH (ref 4.0–10.5)
nRBC: 0 % (ref 0.0–0.2)

## 2020-12-22 LAB — URINALYSIS, COMPLETE (UACMP) WITH MICROSCOPIC
Bilirubin Urine: NEGATIVE
Glucose, UA: NEGATIVE mg/dL
Ketones, ur: NEGATIVE mg/dL
Nitrite: NEGATIVE
Protein, ur: >=300 mg/dL — AB
Specific Gravity, Urine: 1.017 (ref 1.005–1.030)
Squamous Epithelial / HPF: NONE SEEN (ref 0–5)
WBC, UA: >50 WBC/hpf — ABNORMAL HIGH (ref 0–5)
pH: 5 (ref 5.0–8.0)

## 2020-12-22 LAB — BRAIN NATRIURETIC PEPTIDE: B Natriuretic Peptide: 618 pg/mL — ABNORMAL HIGH (ref 0.0–100.0)

## 2020-12-22 LAB — CBC
HCT: 41.9 % (ref 36.0–46.0)
Hemoglobin: 13.1 g/dL (ref 12.0–15.0)
MCH: 27.8 pg (ref 26.0–34.0)
MCHC: 31.3 g/dL (ref 30.0–36.0)
MCV: 88.8 fL (ref 80.0–100.0)
Platelets: 287 10*3/uL (ref 150–400)
RBC: 4.72 MIL/uL (ref 3.87–5.11)
RDW: 20.4 % — ABNORMAL HIGH (ref 11.5–15.5)
WBC: 48 10*3/uL — ABNORMAL HIGH (ref 4.0–10.5)
nRBC: 0 % (ref 0.0–0.2)

## 2020-12-22 LAB — BASIC METABOLIC PANEL
Anion gap: 11 (ref 5–15)
BUN: 14 mg/dL (ref 8–23)
CO2: 23 mmol/L (ref 22–32)
Calcium: 8.6 mg/dL — ABNORMAL LOW (ref 8.9–10.3)
Chloride: 96 mmol/L — ABNORMAL LOW (ref 98–111)
Creatinine, Ser: 2.79 mg/dL — ABNORMAL HIGH (ref 0.44–1.00)
GFR, Estimated: 17 mL/min — ABNORMAL LOW (ref >60)
Glucose, Bld: 89 mg/dL (ref 70–99)
Potassium: 3 mmol/L — ABNORMAL LOW (ref 3.5–5.1)
Sodium: 130 mmol/L — ABNORMAL LOW (ref 135–145)

## 2020-12-22 LAB — PROCALCITONIN: Procalcitonin: 12.68 ng/mL

## 2020-12-22 LAB — TROPONIN I (HIGH SENSITIVITY)
Troponin I (High Sensitivity): 14 ng/L (ref <18)
Troponin I (High Sensitivity): 19 ng/L — ABNORMAL HIGH (ref <18)

## 2020-12-22 LAB — MAGNESIUM: Magnesium: 1.7 mg/dL (ref 1.7–2.4)

## 2020-12-22 LAB — HEPATIC FUNCTION PANEL
ALT: 15 U/L (ref 0–44)
AST: 23 U/L (ref 15–41)
Albumin: 2.1 g/dL — ABNORMAL LOW (ref 3.5–5.0)
Alkaline Phosphatase: 176 U/L — ABNORMAL HIGH (ref 38–126)
Bilirubin, Direct: <0.1 mg/dL (ref 0.0–0.2)
Total Bilirubin: 0.7 mg/dL (ref 0.3–1.2)
Total Protein: 4.9 g/dL — ABNORMAL LOW (ref 6.5–8.1)

## 2020-12-22 MED ORDER — ONDANSETRON HCL 4 MG PO TABS: 4.0000 mg | ORAL_TABLET | Freq: Four times a day (QID) | ORAL | Status: DC | PRN

## 2020-12-22 MED ORDER — METOPROLOL TARTRATE 25 MG PO TABS
12.5000 mg | ORAL_TABLET | Freq: Two times a day (BID) | ORAL | Status: DC
  Administered 2020-12-23 – 2020-12-24 (×4): 12.5 mg via ORAL
  Filled 2020-12-22 (×4): qty 1

## 2020-12-22 MED ORDER — MELATONIN 5 MG PO TABS
5.0000 mg | ORAL_TABLET | Freq: Every day | ORAL | Status: DC
  Administered 2020-12-23 – 2020-12-24 (×3): 5 mg via ORAL
  Filled 2020-12-22 (×3): qty 1

## 2020-12-22 MED ORDER — ACETAMINOPHEN 325 MG PO TABS
650.0000 mg | ORAL_TABLET | Freq: Four times a day (QID) | ORAL | Status: DC | PRN
  Administered 2020-12-23: 650 mg via ORAL
  Filled 2020-12-22: qty 2

## 2020-12-22 MED ORDER — LACOSAMIDE 50 MG PO TABS
50.0000 mg | ORAL_TABLET | Freq: Two times a day (BID) | ORAL | Status: DC
  Administered 2020-12-23 – 2020-12-24 (×5): 50 mg via ORAL
  Filled 2020-12-22 (×5): qty 1

## 2020-12-22 MED ORDER — POTASSIUM CHLORIDE CRYS ER 20 MEQ PO TBCR
40.0000 meq | EXTENDED_RELEASE_TABLET | Freq: Once | ORAL | Status: AC
  Administered 2020-12-22: 40 meq via ORAL
  Filled 2020-12-22: qty 2

## 2020-12-22 MED ORDER — RENA-VITE PO TABS
1.0000 | ORAL_TABLET | Freq: Every day | ORAL | Status: DC
  Administered 2020-12-23 – 2020-12-24 (×2): 1 via ORAL
  Filled 2020-12-22 (×3): qty 1

## 2020-12-22 MED ORDER — BRIVARACETAM 25 MG PO TABS: 75.0000 mg | ORAL_TABLET | Freq: Two times a day (BID) | ORAL | Status: DC

## 2020-12-22 MED ORDER — SODIUM CHLORIDE 0.9 % IV SOLN
1.0000 g | INTRAVENOUS | Status: DC
  Administered 2020-12-23: 1 g via INTRAVENOUS
  Filled 2020-12-22: qty 10
  Filled 2020-12-22: qty 1

## 2020-12-22 MED ORDER — PANTOPRAZOLE SODIUM 40 MG PO TBEC
40.0000 mg | DELAYED_RELEASE_TABLET | Freq: Every day | ORAL | Status: DC
  Administered 2020-12-23 – 2020-12-25 (×3): 40 mg via ORAL
  Filled 2020-12-22 (×4): qty 1

## 2020-12-22 MED ORDER — ASPIRIN 81 MG PO CHEW
81.0000 mg | CHEWABLE_TABLET | Freq: Every day | ORAL | Status: DC
  Administered 2020-12-23 – 2020-12-24 (×2): 81 mg via ORAL
  Filled 2020-12-22 (×2): qty 1

## 2020-12-22 MED ORDER — SODIUM CHLORIDE 0.9 % IV SOLN
1.0000 g | Freq: Once | INTRAVENOUS | Status: AC
  Administered 2020-12-22: 1 g via INTRAVENOUS
  Filled 2020-12-22: qty 10

## 2020-12-22 MED ORDER — ACETAMINOPHEN 650 MG RE SUPP: 650.0000 mg | Freq: Four times a day (QID) | RECTAL | Status: DC | PRN

## 2020-12-22 MED ORDER — SENNOSIDES-DOCUSATE SODIUM 8.6-50 MG PO TABS: 1.0000 | ORAL_TABLET | Freq: Every evening | ORAL | Status: DC | PRN

## 2020-12-22 MED ORDER — ATORVASTATIN CALCIUM 20 MG PO TABS
40.0000 mg | ORAL_TABLET | Freq: Every day | ORAL | Status: DC
  Administered 2020-12-23 – 2020-12-24 (×2): 40 mg via ORAL
  Filled 2020-12-22 (×3): qty 2

## 2020-12-22 MED ORDER — ONDANSETRON HCL 4 MG/2ML IJ SOLN: 4.0000 mg | Freq: Four times a day (QID) | INTRAMUSCULAR | Status: DC | PRN

## 2020-12-22 MED ORDER — SODIUM CHLORIDE 0.9% FLUSH
3.0000 mL | Freq: Two times a day (BID) | INTRAVENOUS | Status: DC
  Administered 2020-12-22 – 2020-12-24 (×5): 3 mL via INTRAVENOUS

## 2020-12-22 MED ORDER — FUROSEMIDE 10 MG/ML IJ SOLN
20.0000 mg | Freq: Once | INTRAMUSCULAR | Status: AC
  Administered 2020-12-22: 20 mg via INTRAVENOUS
  Filled 2020-12-22: qty 4

## 2020-12-22 MED ORDER — TRAZODONE HCL 50 MG PO TABS
25.0000 mg | ORAL_TABLET | Freq: Every evening | ORAL | Status: DC | PRN
  Administered 2020-12-23 – 2020-12-24 (×2): 50 mg via ORAL
  Filled 2020-12-22 (×2): qty 1

## 2020-12-22 NOTE — ED Notes (Signed)
Pt moved to room 30   Report off to [place].

## 2020-12-22 NOTE — ED Notes (Signed)
Cath ua to lab.  Pt had bm, pt cleaned and fresh diaper in place along with purewick  Family with pt.

## 2020-12-22 NOTE — ED Provider Notes (Signed)
[facility] Emergency Department Provider Note  ____________________________________________   Event Date/Time   First MD Initiated Contact with Patient 12/22/20 1701     (approximate)  I have reviewed the triage vital signs and the nursing notes.   HISTORY  Chief Complaint Loss of Consciousness   HPI Amy [name] is a 70 y.o. female with a PMH of ESRD on HD, anxiety, anemia, gastritis, GERD, CAD acute on chronic SDH status post partial craniotomy and evacuation of SDH (in March 2022), and HTN who presents via EMS from dialysis after syncopal episode.  Patient states he does not remember what happened remembers feeling nauseous and having some vomiting for him.  Per family at bedside she is often complaining of some belly pain in the morning.  She is typically on dialysis Monday Wednesday Friday and is otherwise significant usual state of health.  No recent fevers, cough, vomiting, diarrhea, dysuria although her daughter states she is currently being treated for UTI.  She otherwise has not had any recent passing out episodes or falls or injuries.         Past Medical History:  Diagnosis Date   Anemia    a. 08/2016 Guaiac + stool. EGD w/ gastritis.   Anxiety    Closed fracture of shaft of left ulna 11/27/2018   Colon polyp    a. 08/2016 Colonoscopy.   ESRD on peritoneal dialysis ([place])    Gastritis    a. 08/2016 EGD: Gastritis-->PPI.   GERD (gastroesophageal reflux disease)    History of chicken pox    History of measles as a child    Hypertension    Hyponatremia    a. 08/2016 in setting of HCTZ Rx.   Leukocytosis    Mesenteric lymphadenopathy 04/08/2017   Multiple thyroid nodules     Patient Active Problem List   Diagnosis Date Noted   Complication associated with dialysis catheter    Pulmonary edema 11/07/2020   CAD (coronary artery disease) of artery bypass graft 09/15/2020   History of nausea and vomiting 09/15/2020   Sleep disturbance     SDH (subdural hematoma) ([place]) 08/07/2020   Acute on chronic anemia    Subdural hemorrhage following injury ([place]) 08/04/2020   Orthostasis    Gastroparesis    Chronic combined systolic and diastolic congestive heart failure (HCC)    Prolonged Q-T interval on ECG    Nausea    Hypoalbuminemia due to protein-calorie malnutrition (HCC)    Nausea and vomiting    Chronic combined systolic and diastolic CHF (congestive heart failure) (HCC)    Anemia of chronic disease    Seizures (HCC)    Chronic pain syndrome    Debility 07/04/2020   Ischemic cardiomyopathy    Protein-calorie malnutrition, severe 06/23/2020   Non-ST elevation (NSTEMI) myocardial infarction ([place])    Pressure injury of skin 06/21/2020   C. difficile diarrhea 06/19/2020   C. difficile colitis 06/19/2020   Seizure ([place]) 06/19/2020   Gastroenteritis due to COVID-19 virus 05/29/2020   Surgery, elective    S/P ORIF (open reduction internal fixation) fracture    Closed right hip fracture, initial encounter ([place]) 04/22/2020   Hypotension 12/16/2019   ESRD on dialysis ([place]) 12/16/2019   Depression with anxiety 12/16/2019   Hypomagnesemia 12/16/2019   Atrial flutter ([place])    Atrial fibrillation with RVR ([place]) 04/03/2019   Benign essential HTN 04/03/2019   HLD (hyperlipidemia) 04/03/2019   ESRD (end stage renal disease) ([place]) 12/13/2018   Itching 11/08/2018  Hyperparathyroidism, secondary renal ([place]) 09/13/2017   Ground glass opacity present on imaging of lung 04/08/2017   Osteoarthritis of knee 12/29/2016   Normocytic anemia 10/14/2016   Swelling of limb    Chronic headaches    Gastritis    History of adenomatous polyp of colon 09/07/2016   Chronic abdominal pain 09/05/2016   Iron deficiency anemia 09/05/2016   Hyponatremia 09/05/2016   Multiple thyroid nodules 07/04/2015   Right thyroid nodule 06/27/2015   Allergic rhinitis, seasonal 12/03/2014   Fever blister 12/03/2014   Herniated lumbar intervertebral disc  12/03/2014   Hypokalemia 12/03/2014   Recurrent sinus infections 12/03/2014   Insomnia 11/05/2014   Acquired scoliosis 06/26/2014   Intervertebral disc stenosis of neural canal of lumbar region 06/20/2014   Lumbar facet arthropathy 05/21/2014   Degenerative spondylolisthesis 03/12/2014   OP (osteoporosis) 08/10/2013   Idiopathic peripheral neuropathy 03/24/2009   Edema 12/27/2007   Headache, tension-type 10/25/2006   Primary hypertension 10/24/2006   Anxiety 05/03/1998   Esophageal reflux 05/03/1998    Past Surgical History:  Procedure Laterality Date   ABDOMINAL HYSTERECTOMY  2003   due to heavy periods and clotting during menses   BREAST LUMPECTOMY Left 1990   CHOLECYSTECTOMY  2010   COLONOSCOPY WITH PROPOFOL N/A 09/07/2016   Procedure: COLONOSCOPY WITH PROPOFOL;  Surgeon: Lucilla Lame, MD;  Location: [facility] ENDOSCOPY;  Service: Endoscopy;  Laterality: N/A;   CORONARY STENT INTERVENTION N/A 06/27/2020   Procedure: CORONARY STENT INTERVENTION;  Surgeon: Sherren Mocha, MD;  Location: [place] CV LAB;  Service: Cardiovascular;  Laterality: N/A;   DIALYSIS/PERMA CATHETER INSERTION N/A 11/16/2018   Procedure: DIALYSIS/PERMA CATHETER INSERTION;  Surgeon: Algernon Huxley, MD;  Location: [place] [facility] LAB;  Service: Cardiovascular;  Laterality: N/A;   DIALYSIS/PERMA CATHETER INSERTION N/A 11/10/2020   Procedure: DIALYSIS/PERMA CATHETER EXCHANGE;  Surgeon: Algernon Huxley, MD;  Location: [place] [facility] LAB;  Service: Cardiovascular;  Laterality: N/A;   DIALYSIS/PERMA CATHETER REMOVAL N/A 08/16/2019   Procedure: DIALYSIS/PERMA CATHETER REMOVAL;  Surgeon: Algernon Huxley, MD;  Location: [place] CV LAB;  Service: Cardiovascular;  Laterality: N/A;   DILATION AND CURETTAGE OF UTERUS  1985   ESOPHAGOGASTRODUODENOSCOPY (EGD) WITH PROPOFOL N/A 09/07/2016   Procedure: ESOPHAGOGASTRODUODENOSCOPY (EGD) WITH PROPOFOL;  Surgeon: Lucilla Lame, MD;  Location: [facility] ENDOSCOPY;  Service: Endoscopy;   Laterality: N/A;   GALLBLADDER SURGERY  2012   INTRAMEDULLARY (IM) NAIL INTERTROCHANTERIC Right 04/23/2020   Procedure: INTRAMEDULLARY (IM) NAIL INTERTROCHANTRIC;  Surgeon: Thornton Park, MD;  Location: [facility] ORS;  Service: Orthopedics;  Laterality: Right;   INTRAVASCULAR IMAGING/OCT N/A 06/27/2020   Procedure: INTRAVASCULAR IMAGING/OCT;  Surgeon: Sherren Mocha, MD;  Location: [place] CV LAB;  Service: Cardiovascular;  Laterality: N/A;   IR FLUORO GUIDE CV LINE RIGHT  09/04/2020   IR US GUIDE VASC ACCESS RIGHT  09/04/2020   KNEE SURGERY     Dr. Tamala Julian   LEFT HEART CATH AND CORONARY ANGIOGRAPHY N/A 06/25/2020   Procedure: LEFT HEART CATH AND CORONARY ANGIOGRAPHY;  Surgeon: Troy Sine, MD;  Location: [place] CV LAB;  Service: Cardiovascular;  Laterality: N/A;   TONSILLECTOMY     TONSILLECTOMY AND ADENOIDECTOMY  1970   UPPER GI ENDOSCOPY  03/02/2006   H. Pylori negative; Dr. Allen Norris    Prior to Admission medications   Medication Sig Start Date End Date Taking? Authorizing Provider  acetaminophen (TYLENOL) 325 MG tablet Take 1-2 tablets (325-650 mg total) by mouth every 4 (four) hours as needed for mild pain.  09/12/20   Love, Ivan Anchors, PA-C  aspirin 81 MG chewable tablet Chew 1 tablet (81 mg total) by mouth daily. 06/30/20   Geradine Girt, DO  atorvastatin (LIPITOR) 40 MG tablet Take 1 tablet (40 mg total) by mouth daily. 09/13/20   Love, Ivan Anchors, PA-C  brivaracetam (BRIVIACT) 25 MG TABS tablet Take 3 tablets (75 mg total) by mouth 2 (two) times daily. 09/12/20   Love, Ivan Anchors, PA-C  butalbital-acetaminophen-caffeine (FIORICET) [phone] MG tablet Take 1 tablet by mouth 3 (three) times daily as needed for headache. 09/12/20   Love, Ivan Anchors, PA-C  camphor-menthol [facility]) lotion Apply topically as needed for itching. 09/12/20   Love, Ivan Anchors, PA-C  Chlorhexidine Gluconate Cloth 2 % PADS Apply 6 each topically daily. 09/13/20   Love, Ivan Anchors, PA-C  Darbepoetin Alfa (ARANESP) 60 MCG/0.3ML  SOSY injection Inject 0.3 mLs (60 mcg total) into the vein every Saturday with hemodialysis. 09/20/20   Love, Ivan Anchors, PA-C  docusate sodium (COLACE) 100 MG capsule Take 1 capsule (100 mg total) by mouth 2 (two) times daily. 09/12/20   Love, Ivan Anchors, PA-C  gentamicin cream (GARAMYCIN) 0.1 % Apply 1 application topically daily. 07/24/20   Love, Ivan Anchors, PA-C  lacosamide (VIMPAT) 50 MG TABS tablet Take 1 tablet (50 mg total) by mouth 2 (two) times daily. 09/12/20   Love, Ivan Anchors, PA-C  melatonin 5 MG TABS Take 1 tablet (5 mg total) by mouth at bedtime. 09/12/20   Love, Ivan Anchors, PA-C  methocarbamol (ROBAXIN) 500 MG tablet Take 1 tablet (500 mg total) by mouth every 6 (six) hours as needed for muscle spasms. 09/12/20   Love, Ivan Anchors, PA-C  metoCLOPramide (REGLAN) 5 MG tablet Take 1 tablet (5 mg total) by mouth daily after breakfast. 09/16/20   Love, Ivan Anchors, PA-C  metoprolol tartrate (LOPRESSOR) 25 mg/10 mL SUSP Take 2.5 mLs (6.25 mg total) by mouth 2 (two) times daily. 09/12/20   Love, Ivan Anchors, PA-C  multivitamin (RENA-VIT) TABS tablet Take 1 tablet by mouth at bedtime. 09/12/20   Love, Ivan Anchors, PA-C  pantoprazole (PROTONIX) 40 MG tablet TAKE 1 TABLET (40 MG TOTAL) BY MOUTH DAILY. 06/30/20 06/30/21  Geradine Girt, DO  prochlorperazine (COMPAZINE) 10 MG tablet Take 1 tablet (10 mg total) by mouth every 8 (eight) hours as needed for nausea or vomiting. 07/24/20   Love, Ivan Anchors, PA-C  traZODone (DESYREL) 50 MG tablet Take 0.5-1 tablets (25-50 mg total) by mouth at bedtime as needed for sleep. 09/12/20   Love, Ivan Anchors, PA-C  triamcinolone cream (KENALOG) 0.1 % Apply topically 3 (three) times daily. 09/12/20   Love, Ivan Anchors, PA-C    Allergies Cephalosporins, Clarithromycin, Ensure [nutritional supplements], Gabapentin, Lunesta  [eszopiclone], Nepro [compleat], Penicillin v, and Sulfa antibiotics  Family History  Problem Relation Age of Onset   Pancreatic cancer Father    Congestive Heart Failure  Maternal Grandmother    Cancer Paternal Grandfather    Heart disease Maternal Uncle     Social History Social History   Tobacco Use   Smoking status: Never   Smokeless tobacco: Never  Vaping Use   Vaping Use: Never used  Substance Use Topics   Alcohol use: Yes    Alcohol/week: 0.0 - 2.0 standard drinks   Drug use: No    Review of Systems  Review of Systems  Constitutional:  Negative for chills and fever.  HENT:  Negative for sore throat.   Eyes:  Negative for  pain.  Respiratory:  Negative for cough and stridor.   Cardiovascular:  Negative for chest pain.  Gastrointestinal:  Positive for abdominal pain and nausea. Negative for vomiting.  Genitourinary:  Negative for dysuria.  Musculoskeletal:  Negative for myalgias.  Skin:  Negative for rash.  Neurological:  Positive for loss of consciousness. Negative for seizures and headaches.  Psychiatric/Behavioral:  Negative for suicidal ideas.   All other systems reviewed and are negative.    ____________________________________________   PHYSICAL EXAM:  VITAL SIGNS: ED Triage Vitals  Enc Vitals Group     BP 12/22/20 1253 135/73     Pulse Rate 12/22/20 1253 86     Resp 12/22/20 1253 17     Temp 12/22/20 1253 97.9 F (36.6 C)     Temp Source 12/22/20 1253 Oral     SpO2 12/22/20 1253 94 %     Weight --      Height --      Head Circumference --      Peak Flow --      Pain Score 12/22/20 1234 0     Pain Loc --      Pain Edu? --      Excl. in [place]? --    Vitals:   12/22/20 1711 12/22/20 1805  BP: (!) 149/81 (!) 156/88  Pulse: 84 85  Resp: 18 (!) 25  Temp: 98.4 F (36.9 C)   SpO2: 92% 93%   Physical Exam Vitals and nursing note reviewed.  Constitutional:      General: She is not in acute distress.    Appearance: She is well-developed.  HENT:     Head: Normocephalic and atraumatic.     Right Ear: External ear normal.     Left Ear: External ear normal.     Nose: Nose normal.  Eyes:     Conjunctiva/sclera:  Conjunctivae normal.  Cardiovascular:     Rate and Rhythm: Normal rate and regular rhythm.     Heart sounds: No murmur heard. Pulmonary:     Effort: Pulmonary effort is normal. No respiratory distress.     Breath sounds: Normal breath sounds.  Abdominal:     Palpations: Abdomen is soft.     Tenderness: There is no abdominal tenderness.  Musculoskeletal:     Cervical back: Neck supple.     Right lower leg: Edema present.     Left lower leg: Edema present.  Skin:    General: Skin is warm and dry.  Neurological:     Mental Status: She is alert and oriented to person, place, and time. She is confused.     Comments: She is oriented to the year but not month.  Daughter at bedside states that she seems a little more forgetful last couple days but that she does not always remember the date.  Cranial nerves II through XII are grossly intact.  Patient has very slight tremor in the upper extremities but no pronator drift or finger dysmetria.      PERRLA.  EOMI.  Patient has symmetric strength in upper extremities.  She is able to lift her heels off the bed on both lower extremities but is able to flex or extend at the knees or hips significantly.  She is fairly stiff.  On extension which apparently is chronic for her.  She is not appropriate at baseline. ____________________________________________   LABS (all labs ordered are listed, but only abnormal results are displayed)  Labs Reviewed  BASIC METABOLIC PANEL - Abnormal;  Notable for the following components:      Result Value   Sodium 130 (*)    Potassium 3.0 (*)    Chloride 96 (*)    Creatinine, Ser 2.79 (*)    Calcium 8.6 (*)    GFR, Estimated 17 (*)    All other components within normal limits  CBC - Abnormal; Notable for the following components:   WBC 48.0 (*)    RDW 20.4 (*)    All other components within normal limits  BRAIN NATRIURETIC PEPTIDE - Abnormal; Notable for the following components:   B Natriuretic Peptide 618.0 (*)     All other components within normal limits  HEPATIC FUNCTION PANEL - Abnormal; Notable for the following components:   Total Protein 4.9 (*)    Albumin 2.1 (*)    Alkaline Phosphatase 176 (*)    All other components within normal limits  CBC WITH DIFFERENTIAL/PLATELET - Abnormal; Notable for the following components:   WBC 47.8 (*)    RDW 20.0 (*)    Neutro Abs 44.4 (*)    Lymphs Abs 0.6 (*)    Monocytes Absolute 1.9 (*)    Basophils Absolute 0.2 (*)    Abs Immature Granulocytes 0.73 (*)    All other components within normal limits  MAGNESIUM  URINALYSIS, COMPLETE (UACMP) WITH MICROSCOPIC  PROCALCITONIN  CBG MONITORING, ED  TROPONIN I (HIGH SENSITIVITY)  TROPONIN I (HIGH SENSITIVITY)   ____________________________________________  EKG  Sinus rhythm with a ventricular rate of 85, normal axis and multiple nonspecific ST changes more pronounced in inferior leads.  Somewhat low amplitude throughout. ____________________________________________  RADIOLOGY  ED MD interpretation: CT head shows postoperative changes and interval evacuation of previously large subdural and small chronic subdural still present without any evidence of recurrence or other acute CVA or other acute process.  Chest x-ray has evidence of bilateral pleural effusions and some atelectasis.  No focal consolidation, overt edema pneumothorax or other acute thoracic process.  CT abdomen pelvis remarkable for bilateral pleural effusions as well as moderate abdominal and pelvic ascites soft tissue edema.  There is likely intrarenal stones bilaterally possibly stone in the bladder.  There is evidence of diverticuli without evidence of diverticulitis.  No evidence of SBO, appendicitis, cholecystitis, pancreatitis or other acute abdominopelvic process.  Official radiology report(s): CT ABDOMEN PELVIS WO CONTRAST  Result Date: 12/22/2020 CLINICAL DATA:  Acute nonlocalized abdominal pain. EXAM: CT ABDOMEN AND PELVIS WITHOUT  CONTRAST TECHNIQUE: Multidetector CT imaging of the abdomen and pelvis was performed following the standard protocol without IV contrast. COMPARISON:  09/27/2014 FINDINGS: Lower chest: Moderate to large bilateral pleural effusions with basilar atelectasis. Hepatobiliary: No focal liver abnormality is seen. Status post cholecystectomy. No biliary dilatation. Pancreas: Unremarkable. No pancreatic ductal dilatation or surrounding inflammatory changes. Spleen: Normal in size without focal abnormality. Adrenals/Urinary Tract: No adrenal gland nodules. Increased density in the kidneys could represent intrarenal stones or more likely residual contrast material from prior contrast administration. No hydronephrosis or hydroureter. Bladder is decompressed. There appears to be a stone in the dependent bladder on the right. Stomach/Bowel: Stomach, small bowel, and colon are mostly decompressed. Scattered colonic diverticula without evidence of diverticulitis. Appendix is not identified. Vascular/Lymphatic: Scattered aortic calcification. No aneurysm. Scattered retroperitoneal lymph nodes without pathologic enlargement. Reproductive: Status post hysterectomy. No adnexal masses. Other: Moderate free fluid in the abdomen and pelvis, likely ascites. Diffuse edema throughout the subcutaneous fatty tissues of the abdomen, pelvis, and visualized lower chest. No free air. Musculoskeletal: Previous  internal fixation of the right hip. Degenerative changes in the spine. No destructive bone lesions. IMPRESSION: 1. Moderate bilateral pleural effusions with basilar atelectasis. 2. Moderate abdominal and pelvic ascites. 3. Diffuse soft tissue edema. 4. Increased density in the kidneys may represent multiple intrarenal stones or residual contrast material. Suggestion of a stone in the bladder. 5. Colonic diverticula without evidence of diverticulitis. 6. Aortic atherosclerosis. Electronically Signed   By: Lucienne Capers M.D.   On: 12/22/2020  18:04   DG Chest 2 View  Result Date: 12/22/2020 CLINICAL DATA:  Syncopal episode. EXAM: CHEST - 2 VIEW COMPARISON:  11/07/2020 FINDINGS: Right central venous catheter with tip over the low SVC region. Shallow inspiration. Heart size and pulmonary vascularity are normal for technique. There are bilateral pleural effusions, greater on the left. No perihilar edema or infiltration. Atelectasis in the lung bases. Mediastinal contours appear intact. IMPRESSION: Bilateral pleural effusions with basilar atelectasis, greater on the left. Electronically Signed   By: Lucienne Capers M.D.   On: 12/22/2020 18:17   CT HEAD WO CONTRAST (5MM)  Result Date: 12/22/2020 CLINICAL DATA:  Cerebral hemorrhage suspected. Syncopal episode. Witnessed syncopal episode for 45 seconds during dialysis. EXAM: CT HEAD WITHOUT CONTRAST TECHNIQUE: Contiguous axial images were obtained from the base of the skull through the vertex without intravenous contrast. COMPARISON:  07/25/2020 FINDINGS: Brain: Since the previous study there has been interval operative evacuation of the large subdural hematoma previously seen. There is a small chronic subdural fluid collection demonstrated today. Mild thickening of the dura is likely postoperative. No recurrent hemorrhage is identified. Chronic atrophy. Ventricular dilatation consistent with central atrophy. Low-attenuation changes in the deep white matter consistent small vessel ischemia. Vascular: Moderate intracranial vascular calcifications. Skull: Postoperative left frontoparietal craniotomy with plate and screw fixation of the bone flap. Sinuses/Orbits: Paranasal sinuses and mastoid air cells are clear. Other: None. IMPRESSION: 1. Postoperative changes with interval evacuation of previous large subdural hematoma. Postoperative changes and small chronic subdural collection seen today. No evidence of recurrent intracranial hemorrhage. 2. Chronic atrophy and small vessel ischemic changes.  Electronically Signed   By: Lucienne Capers M.D.   On: 12/22/2020 17:59    ____________________________________________   PROCEDURES  Procedure(s) performed (including Critical Care):  Procedures   ____________________________________________   INITIAL IMPRESSION / ASSESSMENT AND PLAN / ED COURSE      Patient presents with above-stated history and exam for a witnessed syncopal episode that occurred at dialysis earlier today preceded by some nausea and vomiting and abdominal pain.  Patient states he does not remember exactly what happened and states she currently has no nausea or abdominal pain.  On arrival she is afebrile and hemodynamically stable.  She has some weakness in her lower extremities which seems to be chronic but otherwise no convincing evidence of acute neurological deficit.  Initial differential is quite broad and includes arrhythmia, acute anemia, vasovagal syncope, SAH, CVA, metabolic derangements, dehydration, and PE.  No seizure like activity was reported patient is on several antiepileptics and is also possible she had nonconvulsive seizure.  CBC remarkable for fairly substantial leukocytosis with a WBC count of 48 with normal hemoglobin and platelets.  BMP remarkable for some hyponatremia which appears chronic with a sodium of 130 compared to 127 1 month ago, K of 3 and creatinine at baseline without any other significant derangements.  Glucose is 89.  ECG and nonelevated troponin x2 are not suggestive of ACS or arrhythmia.  BNP is elevated at 618 compared to 129 1  month ago and fits patient's clinical picture consistent with gross volume overload.  [place] function panel has no evidence of hepatitis and normal bilirubin.  CT head shows postoperative changes and interval evacuation of previously large subdural and small chronic subdural still present without any evidence of recurrence or other acute CVA or other acute process.  Chest x-ray has evidence of bilateral  pleural effusions and some atelectasis.  No focal consolidation, overt edema pneumothorax or other acute thoracic process.  CT abdomen pelvis remarkable for bilateral pleural effusions as well as moderate abdominal and pelvic ascites soft tissue edema.  There is likely intrarenal stones bilaterally possibly stone in the bladder.  There is evidence of diverticuli without evidence of diverticulitis.  No evidence of SBO, appendicitis, cholecystitis, pancreatitis or other acute abdominopelvic process.   ____________________________________________   FINAL CLINICAL IMPRESSION(S) / ED DIAGNOSES  Final diagnoses:  Syncope and collapse  Hypokalemia  Chronic hyponatremia  ESRD (end stage renal disease) (HCC)  Leukocytosis, unspecified type    Medications  furosemide (LASIX) injection 20 mg (20 mg Intravenous Given 12/22/20 1852)  potassium chloride SA (KLOR-CON) CR tablet 40 mEq (40 mEq Oral Given 12/22/20 1853)     ED Discharge Orders     None        Note:  This document was prepared using Dragon voice recognition software and may include unintentional dictation errors.    Lucrezia Starch, MD 12/22/20 [phone]

## 2020-12-22 NOTE — ED Triage Notes (Signed)
Pt comes via [facility] from [place] with c/o syncopal episode. Pt was at dialysis and witnessed 45s syncopal episode. Pt was about 51 minutes in treatment. Pt is MWF.  Prior to dialysis pt was given some zofran for upset stomach. Pt was also given 692mL by staff and placed on O2 unknown reasons for these interventions.  Pt does have hx of subdural hematoma. Pt is at baseline per staff.  CBG-114 94% RA BP-172/71 HR-80

## 2020-12-22 NOTE — ED Notes (Signed)
RN on ph

## 2020-12-22 NOTE — H&P (Signed)
History and Physical    Amy [name] KAJ:[id number] DOB: 1950-12-11 DOA: 12/22/2020  PCP: Murlean Iba, MD  Patient coming from: Dialysis center  I have personally briefly reviewed patient's old medical records in [place]  Chief Complaint: Syncope  HPI: Amy [name] is a 70 y.o. female with medical history significant for ESRD on MWF HD, HFrEF (EF 25%), severe diffuse CAD s/p DES proximal LAD (06/27/2020), SDH s/p partial craniotomy and evacuation (07/25/2020), seizure disorder (on Briviact and Vimpat), HTN, HLD, chronic hyponatremia, anemia of chronic disease, GERD, L4-L5 stenosis, and depression/anxiety who presented to the ED for evaluation after a syncopal episode while at dialysis.  Patient went to her usual dialysis session this morning (12/22/2020).  After about 50 minutes into her dialysis treatment she had lost consciousness/had a syncopal episode.  Patient does not remember what happened.  Dialysis staff reported that patient was feeling nauseous and had some emesis prior to her syncopal episode.  She did not lose pulse or require CPR.  She regained consciousness after about 45 seconds.  She was subsequently sent to the ED for further evaluation.  Patient feels that she is near her baseline status now.  She does recall having some mild nausea over the last 2 days without any emesis during that time.  She has chronic swelling in her legs which is unchanged.  She reports occasional dyspnea with exertion.  She has significant deconditioning/generalized weakness due to her chronic medical issues and hospitalizations over the last year and currently requires a wheelchair for transport.  She does report becoming lightheaded/dizzy when she sits up after laying down for period of time.  She says this resolves after resting for a moment.  She has not had any recent falls.  She denies any chest pain, cough, abdominal pain.  She does report recent dysuria and was started on an antibiotic 2  days ago for suspected UTI.  She denies any fevers, chills, diaphoresis, diarrhea.  ED Course:  Initial vitals showed BP 135/73, pulse 86, RR 17, temp 97.87F, SPO2 94% on room air.  Labs significant for WBC 48.0, hemoglobin 13.1, platelets 287,000, sodium 130, potassium 3.0, bicarb 23, BUN 14, creatinine 2.79, serum glucose 89, high-sensitivity troponin I 14 > 19, magnesium 1.7, BNP 618, procalcitonin 12.68.  Urinalysis showed negative ketones, >300 protein, negative nitrites, moderate leukocytes, 0-5 RBC/hpf, >50 WBC/hpf, many bacteria microscopy.  Urine culture was obtained and in process.  Blood culture ordered and pending collection.  CT head without contrast showed postoperative changes with interval evacuation of previous large subdural hematoma.  Postoperative changes and small chronic subdural collection seen without evidence of recurrent intracranial hemorrhage.  Chronic atrophy and small vessel ischemic changes noted.  CT abdomen/pelvis without contrast shows moderate bilateral pleural effusions with basilar atelectasis, moderate abdominal and pelvic ascites, diffuse soft tissue edema.  Changes in the kidneys may represent multiple intrarenal stones or residual contrast material.  There is suggestion of stone in the bladder.  Colonic diverticula without evidence of diverticulitis noted.  2 view chest x-ray shows right central venous catheter with tip over the low SVC region, bilateral pleural effusions with basilar atelectasis, left greater than right.  Patient was given IV Lasix 20 mg, oral K 40 mill equivalents, and started on IV ceftriaxone.  The hospitalist service was consulted to admit for further evaluation and management.  Review of Systems: All systems reviewed and are negative except as documented in history of present illness above.   Past Medical History:  Diagnosis Date   Anemia    a. 08/2016 Guaiac + stool. EGD w/ gastritis.   Anxiety    Closed fracture of shaft of  left ulna 11/27/2018   Colon polyp    a. 08/2016 Colonoscopy.   ESRD on peritoneal dialysis ([place])    Gastritis    a. 08/2016 EGD: Gastritis-->PPI.   GERD (gastroesophageal reflux disease)    History of chicken pox    History of measles as a child    Hypertension    Hyponatremia    a. 08/2016 in setting of HCTZ Rx.   Leukocytosis    Mesenteric lymphadenopathy 04/08/2017   Multiple thyroid nodules     Past Surgical History:  Procedure Laterality Date   ABDOMINAL HYSTERECTOMY  2003   due to heavy periods and clotting during menses   BREAST LUMPECTOMY Left 1990   CHOLECYSTECTOMY  2010   COLONOSCOPY WITH PROPOFOL N/A 09/07/2016   Procedure: COLONOSCOPY WITH PROPOFOL;  Surgeon: Lucilla Lame, MD;  Location: [facility];  Service: Endoscopy;  Laterality: N/A;   CORONARY STENT INTERVENTION N/A 06/27/2020   Procedure: CORONARY STENT INTERVENTION;  Surgeon: Sherren Mocha, MD;  Location: [place] [facility];  Service: Cardiovascular;  Laterality: N/A;   DIALYSIS/PERMA CATHETER INSERTION N/A 11/16/2018   Procedure: DIALYSIS/PERMA CATHETER INSERTION;  Surgeon: Algernon Huxley, MD;  Location: [place] [facility];  Service: Cardiovascular;  Laterality: N/A;   DIALYSIS/PERMA CATHETER INSERTION N/A 11/10/2020   Procedure: DIALYSIS/PERMA CATHETER EXCHANGE;  Surgeon: Algernon Huxley, MD;  Location: [place] [facility];  Service: Cardiovascular;  Laterality: N/A;   DIALYSIS/PERMA CATHETER REMOVAL N/A 08/16/2019   Procedure: DIALYSIS/PERMA CATHETER REMOVAL;  Surgeon: Algernon Huxley, MD;  Location: [place] [facility];  Service: Cardiovascular;  Laterality: N/A;   DILATION AND CURETTAGE OF UTERUS  1985   ESOPHAGOGASTRODUODENOSCOPY (EGD) WITH PROPOFOL N/A 09/07/2016   Procedure: ESOPHAGOGASTRODUODENOSCOPY (EGD) WITH PROPOFOL;  Surgeon: Lucilla Lame, MD;  Location: [facility];  Service: Endoscopy;  Laterality: N/A;   GALLBLADDER SURGERY  2012   INTRAMEDULLARY (IM) NAIL INTERTROCHANTERIC Right 04/23/2020    Procedure: INTRAMEDULLARY (IM) NAIL INTERTROCHANTRIC;  Surgeon: Thornton Park, MD;  Location: [facility];  Service: Orthopedics;  Laterality: Right;   INTRAVASCULAR IMAGING/OCT N/A 06/27/2020   Procedure: INTRAVASCULAR IMAGING/OCT;  Surgeon: Sherren Mocha, MD;  Location: [place] [facility];  Service: Cardiovascular;  Laterality: N/A;   IR FLUORO GUIDE CV LINE RIGHT  09/04/2020   IR US GUIDE VASC ACCESS RIGHT  09/04/2020   KNEE SURGERY     Dr. Tamala Julian   LEFT HEART CATH AND CORONARY ANGIOGRAPHY N/A 06/25/2020   Procedure: LEFT HEART CATH AND CORONARY ANGIOGRAPHY;  Surgeon: Troy Sine, MD;  Location: [place] [facility];  Service: Cardiovascular;  Laterality: N/A;   TONSILLECTOMY     TONSILLECTOMY AND ADENOIDECTOMY  1970   UPPER GI ENDOSCOPY  03/02/2006   H. Pylori negative; Dr. Allen Norris    Social History:  reports that she has never smoked. She has never used smokeless tobacco. She reports current alcohol use. She reports that she does not use drugs.  Allergies  Allergen Reactions   Cephalosporins Hives and Swelling   Clarithromycin Other (See Comments)    Unknown reaction   Ensure [Nutritional Supplements] Nausea And Vomiting   Gabapentin    Lunesta  [Eszopiclone]     Heart racing   Nepro [Compleat] Nausea And Vomiting   Penicillin V     Has patient had a PCN reaction causing immediate rash, facial/tongue/throat  swelling, SOB or lightheadedness with hypotension: Yes Has patient had a PCN reaction causing severe rash involving mucus membranes or skin necrosis: No Has patient had a PCN reaction that required hospitalization No Has patient had a PCN reaction occurring within the last 10 years: No If all of the above answers are "NO", then may proceed with Cephalosporin use.   Sulfa Antibiotics     Unknown reaction    Family History  Problem Relation Age of Onset   Pancreatic cancer Father    Congestive Heart Failure Maternal Grandmother    Cancer Paternal Grandfather    Heart  disease Maternal Uncle      Prior to Admission medications   Medication Sig Start Date End Date Taking? Authorizing Provider  acetaminophen (TYLENOL) 325 MG tablet Take 1-2 tablets (325-650 mg total) by mouth every 4 (four) hours as needed for mild pain. 09/12/20   Love, Ivan Anchors, PA-C  aspirin 81 MG chewable tablet Chew 1 tablet (81 mg total) by mouth daily. 06/30/20   Geradine Girt, DO  atorvastatin (LIPITOR) 40 MG tablet Take 1 tablet (40 mg total) by mouth daily. 09/13/20   Love, Ivan Anchors, PA-C  brivaracetam (BRIVIACT) 25 MG TABS tablet Take 3 tablets (75 mg total) by mouth 2 (two) times daily. 09/12/20   Love, Ivan Anchors, PA-C  butalbital-acetaminophen-caffeine (FIORICET) [phone] MG tablet Take 1 tablet by mouth 3 (three) times daily as needed for headache. 09/12/20   Love, Ivan Anchors, PA-C  camphor-menthol [facility]) lotion Apply topically as needed for itching. 09/12/20   Love, Ivan Anchors, PA-C  Chlorhexidine Gluconate Cloth 2 % PADS Apply 6 each topically daily. 09/13/20   Love, Ivan Anchors, PA-C  Darbepoetin Alfa (ARANESP) 60 MCG/0.3ML SOSY injection Inject 0.3 mLs (60 mcg total) into the vein every Saturday with hemodialysis. 09/20/20   Love, Ivan Anchors, PA-C  docusate sodium (COLACE) 100 MG capsule Take 1 capsule (100 mg total) by mouth 2 (two) times daily. 09/12/20   Love, Ivan Anchors, PA-C  gentamicin cream (GARAMYCIN) 0.1 % Apply 1 application topically daily. 07/24/20   Love, Ivan Anchors, PA-C  lacosamide (VIMPAT) 50 MG TABS tablet Take 1 tablet (50 mg total) by mouth 2 (two) times daily. 09/12/20   Love, Ivan Anchors, PA-C  melatonin 5 MG TABS Take 1 tablet (5 mg total) by mouth at bedtime. 09/12/20   Love, Ivan Anchors, PA-C  methocarbamol (ROBAXIN) 500 MG tablet Take 1 tablet (500 mg total) by mouth every 6 (six) hours as needed for muscle spasms. 09/12/20   Love, Ivan Anchors, PA-C  metoCLOPramide (REGLAN) 5 MG tablet Take 1 tablet (5 mg total) by mouth daily after breakfast. 09/16/20   Love, Ivan Anchors, PA-C  metoprolol  tartrate (LOPRESSOR) 25 mg/10 mL SUSP Take 2.5 mLs (6.25 mg total) by mouth 2 (two) times daily. 09/12/20   Love, Ivan Anchors, PA-C  multivitamin (RENA-VIT) TABS tablet Take 1 tablet by mouth at bedtime. 09/12/20   Love, Ivan Anchors, PA-C  pantoprazole (PROTONIX) 40 MG tablet TAKE 1 TABLET (40 MG TOTAL) BY MOUTH DAILY. 06/30/20 06/30/21  Geradine Girt, DO  prochlorperazine (COMPAZINE) 10 MG tablet Take 1 tablet (10 mg total) by mouth every 8 (eight) hours as needed for nausea or vomiting. 07/24/20   Love, Ivan Anchors, PA-C  traZODone (DESYREL) 50 MG tablet Take 0.5-1 tablets (25-50 mg total) by mouth at bedtime as needed for sleep. 09/12/20   Love, Ivan Anchors, PA-C  triamcinolone cream (KENALOG) 0.1 % Apply topically 3 (  three) times daily. 09/12/20   Bary Leriche, PA-C    Physical Exam: Vitals:   12/22/20 2015 12/22/20 2030 12/22/20 2045 12/22/20 2055  BP:    (!) 146/88  Pulse: 73 72 74   Resp: 20 (!) 22 (!) 23   Temp:      TempSrc:      SpO2: 94% 95% 100%    Constitutional: Elderly woman resting in bed with head elevated, NAD, calm, comfortable Eyes: PERRL, lids and conjunctivae normal ENMT: Mucous membranes are moist. Posterior pharynx clear of any exudate or lesions.Normal dentition.  Neck: normal, supple, no masses. Respiratory: Diminished breath sounds at the lung bases bilaterally otherwise clear to auscultation. Normal respiratory effort. No accessory muscle use.  Cardiovascular: Regular rate and rhythm, no murmurs / rubs / gallops.  +1 bilateral lower extremity edema.  HD cath in place right upper chest wall. Abdomen: no tenderness, no masses palpated. No hepatosplenomegaly.  Musculoskeletal: no clubbing / cyanosis. No joint deformity upper and lower extremities. Good ROM, no contractures. Normal muscle tone.  Skin: no rashes, lesions, ulcers. No induration Neurologic: CN 2-12 grossly intact. Sensation intact. Strength 5/5 in all 4.  Psychiatric: Normal judgment and insight. Alert and oriented  x 3. Normal mood.   Labs on Admission: I have personally reviewed following labs and imaging studies  CBC: Recent Labs  Lab 12/22/20 1223  WBC 47.8*  48.0*  NEUTROABS 44.4*  HGB 13.1  13.1  HCT 42.3  41.9  MCV 89.8  88.8  PLT 292  735   Basic Metabolic Panel: Recent Labs  Lab 12/22/20 1223  NA 130*  K 3.0*  CL 96*  CO2 23  GLUCOSE 89  BUN 14  CREATININE 2.79*  CALCIUM 8.6*  MG 1.7   GFR: CrCl cannot be calculated (Unknown ideal weight.). Liver Function Tests: Recent Labs  Lab 12/22/20 1223  AST 23  ALT 15  ALKPHOS 176*  BILITOT 0.7  PROT 4.9*  ALBUMIN 2.1*   No results for input(s): LIPASE, AMYLASE in the last 168 hours. No results for input(s): AMMONIA in the last 168 hours. Coagulation Profile: No results for input(s): INR, PROTIME in the last 168 hours. Cardiac Enzymes: No results for input(s): CKTOTAL, CKMB, CKMBINDEX, TROPONINI in the last 168 hours. BNP (last 3 results) No results for input(s): PROBNP in the last 8760 hours. HbA1C: No results for input(s): HGBA1C in the last 72 hours. CBG: No results for input(s): GLUCAP in the last 168 hours. Lipid Profile: No results for input(s): CHOL, HDL, LDLCALC, TRIG, CHOLHDL, LDLDIRECT in the last 72 hours. Thyroid Function Tests: No results for input(s): TSH, T4TOTAL, FREET4, T3FREE, THYROIDAB in the last 72 hours. Anemia Panel: No results for input(s): VITAMINB12, FOLATE, FERRITIN, TIBC, IRON, RETICCTPCT in the last 72 hours. Urine analysis:    Component Value Date/Time   COLORURINE YELLOW (A) 12/22/2020 1950   APPEARANCEUR TURBID (A) 12/22/2020 1950   LABSPEC 1.017 12/22/2020 1950   PHURINE 5.0 12/22/2020 1950   GLUCOSEU NEGATIVE 12/22/2020 1950   HGBUR SMALL (A) 12/22/2020 1950   BILIRUBINUR NEGATIVE 12/22/2020 1950   BILIRUBINUR negative 08/31/2018 1132   [place] NEGATIVE 12/22/2020 1950   PROTEINUR >=300 (A) 12/22/2020 1950   UROBILINOGEN 0.2 08/31/2018 1132   NITRITE NEGATIVE  12/22/2020 1950   LEUKOCYTESUR MODERATE (A) 12/22/2020 1950    Radiological Exams on Admission: CT ABDOMEN PELVIS WO CONTRAST  Result Date: 12/22/2020 CLINICAL DATA:  Acute nonlocalized abdominal pain. EXAM: CT ABDOMEN AND PELVIS WITHOUT CONTRAST TECHNIQUE:  Multidetector CT imaging of the abdomen and pelvis was performed following the standard protocol without IV contrast. COMPARISON:  09/27/2014 FINDINGS: Lower chest: Moderate to large bilateral pleural effusions with basilar atelectasis. Hepatobiliary: No focal liver abnormality is seen. Status post cholecystectomy. No biliary dilatation. Pancreas: Unremarkable. No pancreatic ductal dilatation or surrounding inflammatory changes. Spleen: Normal in size without focal abnormality. Adrenals/Urinary Tract: No adrenal gland nodules. Increased density in the kidneys could represent intrarenal stones or more likely residual contrast material from prior contrast administration. No hydronephrosis or hydroureter. Bladder is decompressed. There appears to be a stone in the dependent bladder on the right. Stomach/Bowel: Stomach, small bowel, and colon are mostly decompressed. Scattered colonic diverticula without evidence of diverticulitis. Appendix is not identified. Vascular/Lymphatic: Scattered aortic calcification. No aneurysm. Scattered retroperitoneal lymph nodes without pathologic enlargement. Reproductive: Status post hysterectomy. No adnexal masses. Other: Moderate free fluid in the abdomen and pelvis, likely ascites. Diffuse edema throughout the subcutaneous fatty tissues of the abdomen, pelvis, and visualized lower chest. No free air. Musculoskeletal: Previous internal fixation of the right hip. Degenerative changes in the spine. No destructive bone lesions. IMPRESSION: 1. Moderate bilateral pleural effusions with basilar atelectasis. 2. Moderate abdominal and pelvic ascites. 3. Diffuse soft tissue edema. 4. Increased density in the kidneys may represent  multiple intrarenal stones or residual contrast material. Suggestion of a stone in the bladder. 5. Colonic diverticula without evidence of diverticulitis. 6. Aortic atherosclerosis. Electronically Signed   By: Lucienne Capers M.D.   On: 12/22/2020 18:04   DG Chest 2 View  Result Date: 12/22/2020 CLINICAL DATA:  Syncopal episode. EXAM: CHEST - 2 VIEW COMPARISON:  11/07/2020 FINDINGS: Right central venous catheter with tip over the low SVC region. Shallow inspiration. Heart size and pulmonary vascularity are normal for technique. There are bilateral pleural effusions, greater on the left. No perihilar edema or infiltration. Atelectasis in the lung bases. Mediastinal contours appear intact. IMPRESSION: Bilateral pleural effusions with basilar atelectasis, greater on the left. Electronically Signed   By: Lucienne Capers M.D.   On: 12/22/2020 18:17   CT HEAD WO CONTRAST (5MM)  Result Date: 12/22/2020 CLINICAL DATA:  Cerebral hemorrhage suspected. Syncopal episode. Witnessed syncopal episode for 45 seconds during dialysis. EXAM: CT HEAD WITHOUT CONTRAST TECHNIQUE: Contiguous axial images were obtained from the base of the skull through the vertex without intravenous contrast. COMPARISON:  07/25/2020 FINDINGS: Brain: Since the previous study there has been interval operative evacuation of the large subdural hematoma previously seen. There is a small chronic subdural fluid collection demonstrated today. Mild thickening of the dura is likely postoperative. No recurrent hemorrhage is identified. Chronic atrophy. Ventricular dilatation consistent with central atrophy. Low-attenuation changes in the deep white matter consistent small vessel ischemia. Vascular: Moderate intracranial vascular calcifications. Skull: Postoperative left frontoparietal craniotomy with plate and screw fixation of the bone flap. Sinuses/Orbits: Paranasal sinuses and mastoid air cells are clear. Other: None. IMPRESSION: 1. Postoperative  changes with interval evacuation of previous large subdural hematoma. Postoperative changes and small chronic subdural collection seen today. No evidence of recurrent intracranial hemorrhage. 2. Chronic atrophy and small vessel ischemic changes. Electronically Signed   By: Lucienne Capers M.D.   On: 12/22/2020 17:59    EKG: Personally reviewed. Normal sinus rhythm without acute ischemic changes, low voltage, QTC 440.  Similar to prior.  Assessment/Plan Principal Problem:   Syncope Active Problems:   Hypokalemia   Hyponatremia   HLD (hyperlipidemia)   ESRD on dialysis [facility])   Ischemic cardiomyopathy  Chronic combined systolic and diastolic CHF (congestive heart failure) ([place])   Amy [name] is a 70 y.o. female with medical history significant for ESRD on MWF HD, HFrEF (EF 25%), severe diffuse CAD s/p DES proximal LAD (06/27/2020), SDH s/p partial craniotomy and evacuation (07/25/2020), seizure disorder (on Briviact and Vimpat), HTN, HLD, chronic hyponatremia, anemia of chronic disease, GERD, L4-L5 stenosis, and depression/anxiety who is admitted for syncope and volume overload.  Syncope: Transient syncopal event occurring shortly into the usual dialysis session on 8/22.  No reported seizure-like activity.  Suspect likely hypotensive event in setting of fluid balance changes and UTI.  Now back to baseline with stable vitals.  She does also report orthostatic type symptoms. -Keep on telemetry -Check orthostatic vitals -PT/OT eval  Volume overload/anasarca with moderate bilateral pleural effusions in the setting of ESRD on MWF HD and HFrEF (EF 25%): Acute on chronic issue.  Currently saturating well on room air without acute respiratory symptoms. -Consult nephrology for dialysis needs  Leukocytosis/UTI: WBC significantly elevated to 48 on admission.  Patient does report dysuria with urinalysis suggestive of UTI.  No other obvious active infection. -Continue IV ceftriaxone -Follow urine and  blood culture -Repeat CBC in a.m.  Chronic hyponatremia: Secondary to volume overload.  Hypokalemia: Oral supplement provided in the ED.  Anemia of chronic disease: Hemoglobin improved from baseline.  Repeat CBC in AM.  Seizure disorder: Continue Briviact and Vimpat.  History of subdural hemorrhage: S/p partial craniotomy and evacuation at [facility] March 2022.  Severe diffuse CAD s/p DES to proximal LAD 06/27/2020: Denies any chest pain.  Troponin minimally elevated likely demand ischemia in setting of volume overload.  EKG without acute changes.  Continue atorvastatin, low-dose Lopressor.  On aspirin alone now due to prior subdural hematoma.  Generalized weakness/deconditioning: Continue PT/OT while in hospital.  DVT prophylaxis: SCDs Code Status: DNR, confirmed with patient on admission Family Communication: Discussed with patient's daughter at bedside Disposition Plan: From SNF and likely return to SNF on discharge Consults called: Routine AM consult to nephrology placed Level of care: Med-Surg Admission status:  Status is: Observation  The patient remains OBS appropriate and will d/c before 2 midnights.  Dispo: The patient is from: SNF              Anticipated d/c is to: SNF              Patient currently is not medically stable to d/c.    Zada Finders MD [facility]  If 7PM-7AM, please contact night-coverage [email or web address]  12/22/2020, 9:01 PM

## 2020-12-22 NOTE — ED Provider Notes (Addendum)
[facility] Emergency Department Provider Note  ____________________________________________   Event Date/Time   First MD Initiated Contact with Patient 12/22/20 1701     (approximate)  I have reviewed the triage vital signs and the nursing notes.   HISTORY  Chief Complaint Loss of Consciousness   HPI Amy [name] is a 70 y.o. female with a PMH of ESRD on HD, anxiety, anemia, gastritis, GERD, CAD acute on chronic SDH status post partial craniotomy and evacuation of SDH (in March 2022), and HTN who presents via EMS from dialysis after syncopal episode.  Patient states he does not remember what happened remembers feeling nauseous and having some vomiting for him.  Per family at bedside she is often complaining of some belly pain in the morning.  She is typically on dialysis Monday Wednesday Friday and is otherwise significant usual state of health.  No recent fevers, cough, vomiting, diarrhea, dysuria although her daughter states she is currently being treated for UTI.  She otherwise has not had any recent passing out episodes or falls or injuries.         Past Medical History:  Diagnosis Date   Anemia    a. 08/2016 Guaiac + stool. EGD w/ gastritis.   Anxiety    Closed fracture of shaft of left ulna 11/27/2018   Colon polyp    a. 08/2016 Colonoscopy.   ESRD on peritoneal dialysis ([place])    Gastritis    a. 08/2016 EGD: Gastritis-->PPI.   GERD (gastroesophageal reflux disease)    History of chicken pox    History of measles as a child    Hypertension    Hyponatremia    a. 08/2016 in setting of HCTZ Rx.   Leukocytosis    Mesenteric lymphadenopathy 04/08/2017   Multiple thyroid nodules     Patient Active Problem List   Diagnosis Date Noted   Syncope 00/93/8182   Complication associated with dialysis catheter    Pulmonary edema 11/07/2020   CAD (coronary artery disease) of artery bypass graft 09/15/2020   History of nausea and vomiting 09/15/2020    Sleep disturbance    SDH (subdural hematoma) ([place]) 08/07/2020   Acute on chronic anemia    Subdural hemorrhage following injury ([place]) 08/04/2020   Orthostasis    Gastroparesis    Chronic combined systolic and diastolic congestive heart failure (HCC)    Prolonged Q-T interval on ECG    Nausea    Hypoalbuminemia due to protein-calorie malnutrition (HCC)    Nausea and vomiting    Chronic combined systolic and diastolic CHF (congestive heart failure) (HCC)    Anemia of chronic disease    Seizures (HCC)    Chronic pain syndrome    Debility 07/04/2020   Ischemic cardiomyopathy    Protein-calorie malnutrition, severe 06/23/2020   Non-ST elevation (NSTEMI) myocardial infarction ([place])    Pressure injury of skin 06/21/2020   C. difficile diarrhea 06/19/2020   C. difficile colitis 06/19/2020   Seizure ([place]) 06/19/2020   Gastroenteritis due to COVID-19 virus 05/29/2020   Surgery, elective    S/P ORIF (open reduction internal fixation) fracture    Closed right hip fracture, initial encounter ([place]) 04/22/2020   Hypotension 12/16/2019   ESRD on dialysis ([place]) 12/16/2019   Depression with anxiety 12/16/2019   Hypomagnesemia 12/16/2019   Atrial flutter ([place])    Atrial fibrillation with RVR ([place]) 04/03/2019   Benign essential HTN 04/03/2019   HLD (hyperlipidemia) 04/03/2019   ESRD (end stage renal disease) ([place]) 12/13/2018  Itching 11/08/2018   Hyperparathyroidism, secondary renal ([place]) 09/13/2017   Ground glass opacity present on imaging of lung 04/08/2017   Osteoarthritis of knee 12/29/2016   Normocytic anemia 10/14/2016   Swelling of limb    Chronic headaches    Gastritis    History of adenomatous polyp of colon 09/07/2016   Chronic abdominal pain 09/05/2016   Iron deficiency anemia 09/05/2016   Hyponatremia 09/05/2016   Multiple thyroid nodules 07/04/2015   Right thyroid nodule 06/27/2015   Allergic rhinitis, seasonal 12/03/2014   Fever blister 12/03/2014   Herniated lumbar  intervertebral disc 12/03/2014   Hypokalemia 12/03/2014   Recurrent sinus infections 12/03/2014   Insomnia 11/05/2014   Acquired scoliosis 06/26/2014   Intervertebral disc stenosis of neural canal of lumbar region 06/20/2014   Lumbar facet arthropathy 05/21/2014   Degenerative spondylolisthesis 03/12/2014   OP (osteoporosis) 08/10/2013   Idiopathic peripheral neuropathy 03/24/2009   Edema 12/27/2007   Headache, tension-type 10/25/2006   Primary hypertension 10/24/2006   Anxiety 05/03/1998   Esophageal reflux 05/03/1998    Past Surgical History:  Procedure Laterality Date   ABDOMINAL HYSTERECTOMY  2003   due to heavy periods and clotting during menses   BREAST LUMPECTOMY Left 1990   CHOLECYSTECTOMY  2010   COLONOSCOPY WITH PROPOFOL N/A 09/07/2016   Procedure: COLONOSCOPY WITH PROPOFOL;  Surgeon: Lucilla Lame, MD;  Location: [facility] ENDOSCOPY;  Service: Endoscopy;  Laterality: N/A;   CORONARY STENT INTERVENTION N/A 06/27/2020   Procedure: CORONARY STENT INTERVENTION;  Surgeon: Sherren Mocha, MD;  Location: [place] CV LAB;  Service: Cardiovascular;  Laterality: N/A;   DIALYSIS/PERMA CATHETER INSERTION N/A 11/16/2018   Procedure: DIALYSIS/PERMA CATHETER INSERTION;  Surgeon: Algernon Huxley, MD;  Location: [place] CV LAB;  Service: Cardiovascular;  Laterality: N/A;   DIALYSIS/PERMA CATHETER INSERTION N/A 11/10/2020   Procedure: DIALYSIS/PERMA CATHETER EXCHANGE;  Surgeon: Algernon Huxley, MD;  Location: [place] CV LAB;  Service: Cardiovascular;  Laterality: N/A;   DIALYSIS/PERMA CATHETER REMOVAL N/A 08/16/2019   Procedure: DIALYSIS/PERMA CATHETER REMOVAL;  Surgeon: Algernon Huxley, MD;  Location: [place] CV LAB;  Service: Cardiovascular;  Laterality: N/A;   DILATION AND CURETTAGE OF UTERUS  1985   ESOPHAGOGASTRODUODENOSCOPY (EGD) WITH PROPOFOL N/A 09/07/2016   Procedure: ESOPHAGOGASTRODUODENOSCOPY (EGD) WITH PROPOFOL;  Surgeon: Lucilla Lame, MD;  Location: [facility] ENDOSCOPY;  Service:  Endoscopy;  Laterality: N/A;   GALLBLADDER SURGERY  2012   INTRAMEDULLARY (IM) NAIL INTERTROCHANTERIC Right 04/23/2020   Procedure: INTRAMEDULLARY (IM) NAIL INTERTROCHANTRIC;  Surgeon: Thornton Park, MD;  Location: [facility] ORS;  Service: Orthopedics;  Laterality: Right;   INTRAVASCULAR IMAGING/OCT N/A 06/27/2020   Procedure: INTRAVASCULAR IMAGING/OCT;  Surgeon: Sherren Mocha, MD;  Location: [place] CV LAB;  Service: Cardiovascular;  Laterality: N/A;   IR FLUORO GUIDE CV LINE RIGHT  09/04/2020   IR US GUIDE VASC ACCESS RIGHT  09/04/2020   KNEE SURGERY     Dr. Tamala Julian   LEFT HEART CATH AND CORONARY ANGIOGRAPHY N/A 06/25/2020   Procedure: LEFT HEART CATH AND CORONARY ANGIOGRAPHY;  Surgeon: Troy Sine, MD;  Location: [place] CV LAB;  Service: Cardiovascular;  Laterality: N/A;   TONSILLECTOMY     TONSILLECTOMY AND ADENOIDECTOMY  1970   UPPER GI ENDOSCOPY  03/02/2006   H. Pylori negative; Dr. Allen Norris    Prior to Admission medications   Medication Sig Start Date End Date Taking? Authorizing Provider  acetaminophen (TYLENOL) 325 MG tablet Take 1-2 tablets (325-650 mg total) by mouth every 4 (four) hours as  needed for mild pain. 09/12/20   Love, Ivan Anchors, PA-C  aspirin 81 MG chewable tablet Chew 1 tablet (81 mg total) by mouth daily. 06/30/20   Geradine Girt, DO  atorvastatin (LIPITOR) 40 MG tablet Take 1 tablet (40 mg total) by mouth daily. 09/13/20   Love, Ivan Anchors, PA-C  brivaracetam (BRIVIACT) 25 MG TABS tablet Take 3 tablets (75 mg total) by mouth 2 (two) times daily. 09/12/20   Love, Ivan Anchors, PA-C  butalbital-acetaminophen-caffeine (FIORICET) [id number] MG tablet Take 1 tablet by mouth 3 (three) times daily as needed for headache. 09/12/20   Love, Ivan Anchors, PA-C  camphor-menthol [facility]) lotion Apply topically as needed for itching. 09/12/20   Love, Ivan Anchors, PA-C  Chlorhexidine Gluconate Cloth 2 % PADS Apply 6 each topically daily. 09/13/20   Love, Ivan Anchors, PA-C  Darbepoetin Alfa (ARANESP) 60  MCG/0.3ML SOSY injection Inject 0.3 mLs (60 mcg total) into the vein every Saturday with hemodialysis. 09/20/20   Love, Ivan Anchors, PA-C  docusate sodium (COLACE) 100 MG capsule Take 1 capsule (100 mg total) by mouth 2 (two) times daily. 09/12/20   Love, Ivan Anchors, PA-C  gentamicin cream (GARAMYCIN) 0.1 % Apply 1 application topically daily. 07/24/20   Love, Ivan Anchors, PA-C  lacosamide (VIMPAT) 50 MG TABS tablet Take 1 tablet (50 mg total) by mouth 2 (two) times daily. 09/12/20   Love, Ivan Anchors, PA-C  melatonin 5 MG TABS Take 1 tablet (5 mg total) by mouth at bedtime. 09/12/20   Love, Ivan Anchors, PA-C  methocarbamol (ROBAXIN) 500 MG tablet Take 1 tablet (500 mg total) by mouth every 6 (six) hours as needed for muscle spasms. 09/12/20   Love, Ivan Anchors, PA-C  metoCLOPramide (REGLAN) 5 MG tablet Take 1 tablet (5 mg total) by mouth daily after breakfast. 09/16/20   Love, Ivan Anchors, PA-C  metoprolol tartrate (LOPRESSOR) 25 mg/10 mL SUSP Take 2.5 mLs (6.25 mg total) by mouth 2 (two) times daily. 09/12/20   Love, Ivan Anchors, PA-C  multivitamin (RENA-VIT) TABS tablet Take 1 tablet by mouth at bedtime. 09/12/20   Love, Ivan Anchors, PA-C  pantoprazole (PROTONIX) 40 MG tablet TAKE 1 TABLET (40 MG TOTAL) BY MOUTH DAILY. 06/30/20 06/30/21  Geradine Girt, DO  prochlorperazine (COMPAZINE) 10 MG tablet Take 1 tablet (10 mg total) by mouth every 8 (eight) hours as needed for nausea or vomiting. 07/24/20   Love, Ivan Anchors, PA-C  traZODone (DESYREL) 50 MG tablet Take 0.5-1 tablets (25-50 mg total) by mouth at bedtime as needed for sleep. 09/12/20   Love, Ivan Anchors, PA-C  triamcinolone cream (KENALOG) 0.1 % Apply topically 3 (three) times daily. 09/12/20   Love, Ivan Anchors, PA-C    Allergies Cephalosporins, Clarithromycin, Ensure [nutritional supplements], Gabapentin, Lunesta  [eszopiclone], Nepro [compleat], Penicillin v, and Sulfa antibiotics  Family History  Problem Relation Age of Onset   Pancreatic cancer Father    Congestive Heart  Failure Maternal Grandmother    Cancer Paternal Grandfather    Heart disease Maternal Uncle     Social History Social History   Tobacco Use   Smoking status: Never   Smokeless tobacco: Never  Vaping Use   Vaping Use: Never used  Substance Use Topics   Alcohol use: Yes    Alcohol/week: 0.0 - 2.0 standard drinks   Drug use: No    Review of Systems  Review of Systems  Constitutional:  Negative for chills and fever.  HENT:  Negative for sore throat.  Eyes:  Negative for pain.  Respiratory:  Negative for cough and stridor.   Cardiovascular:  Negative for chest pain.  Gastrointestinal:  Positive for abdominal pain and nausea. Negative for vomiting.  Genitourinary:  Negative for dysuria.  Musculoskeletal:  Negative for myalgias.  Skin:  Negative for rash.  Neurological:  Positive for loss of consciousness. Negative for seizures and headaches.  Psychiatric/Behavioral:  Negative for suicidal ideas.   All other systems reviewed and are negative.    ____________________________________________   PHYSICAL EXAM:  VITAL SIGNS: ED Triage Vitals  Enc Vitals Group     BP 12/22/20 1253 135/73     Pulse Rate 12/22/20 1253 86     Resp 12/22/20 1253 17     Temp 12/22/20 1253 97.9 F (36.6 C)     Temp Source 12/22/20 1253 Oral     SpO2 12/22/20 1253 94 %     Weight --      Height --      Head Circumference --      Peak Flow --      Pain Score 12/22/20 1234 0     Pain Loc --      Pain Edu? --      Excl. in [place]? --    Vitals:   12/22/20 1930 12/22/20 1945  BP:    Pulse: 79 85  Resp: (!) 21 (!) 24  Temp:    SpO2: 96% 94%   Physical Exam Vitals and nursing note reviewed.  Constitutional:      General: She is not in acute distress.    Appearance: She is well-developed.  HENT:     Head: Normocephalic and atraumatic.     Right Ear: External ear normal.     Left Ear: External ear normal.     Nose: Nose normal.  Eyes:     Conjunctiva/sclera: Conjunctivae normal.   Cardiovascular:     Rate and Rhythm: Normal rate and regular rhythm.     Heart sounds: No murmur heard. Pulmonary:     Effort: Pulmonary effort is normal. No respiratory distress.     Breath sounds: Normal breath sounds.  Abdominal:     Palpations: Abdomen is soft.     Tenderness: no abdominal tenderness  Musculoskeletal:     Cervical back: Neck supple.     Right lower leg: Edema present.     Left lower leg: Edema present.  Skin:    General: Skin is warm and dry.  Neurological:     Mental Status: She is alert and oriented to person, place, and time. She is confused.     Comments: She is oriented to the year but not month.  Daughter at bedside states that she seems a little more forgetful last couple days but that she does not always remember the date.  Cranial nerves II through XII are grossly intact.  Patient has very slight tremor in the upper extremities but no pronator drift or finger dysmetria.      PERRLA.  EOMI.  Patient has symmetric strength in upper extremities.  She is able to lift her heels off the bed on both lower extremities but is able to flex or extend at the knees or hips significantly.  She is fairly stiff.  On extension which apparently is chronic for her.  She is not appropriate at baseline. ____________________________________________   LABS (all labs ordered are listed, but only abnormal results are displayed)  Labs Reviewed  BASIC METABOLIC PANEL - Abnormal; Notable for  the following components:      Result Value   Sodium 130 (*)    Potassium 3.0 (*)    Chloride 96 (*)    Creatinine, Ser 2.79 (*)    Calcium 8.6 (*)    GFR, Estimated 17 (*)    All other components within normal limits  CBC - Abnormal; Notable for the following components:   WBC 48.0 (*)    RDW 20.4 (*)    All other components within normal limits  URINALYSIS, COMPLETE (UACMP) WITH MICROSCOPIC - Abnormal; Notable for the following components:   Color, Urine YELLOW (*)    APPearance  TURBID (*)    Hgb urine dipstick SMALL (*)    Protein, ur >=300 (*)    Leukocytes,Ua MODERATE (*)    WBC, UA >50 (*)    Bacteria, UA MANY (*)    All other components within normal limits  BRAIN NATRIURETIC PEPTIDE - Abnormal; Notable for the following components:   B Natriuretic Peptide 618.0 (*)    All other components within normal limits  HEPATIC FUNCTION PANEL - Abnormal; Notable for the following components:   Total Protein 4.9 (*)    Albumin 2.1 (*)    Alkaline Phosphatase 176 (*)    All other components within normal limits  CBC WITH DIFFERENTIAL/PLATELET - Abnormal; Notable for the following components:   WBC 47.8 (*)    RDW 20.0 (*)    Neutro Abs 44.4 (*)    Lymphs Abs 0.6 (*)    Monocytes Absolute 1.9 (*)    Basophils Absolute 0.2 (*)    Abs Immature Granulocytes 0.73 (*)    All other components within normal limits  TROPONIN I (HIGH SENSITIVITY) - Abnormal; Notable for the following components:   Troponin I (High Sensitivity) 19 (*)    All other components within normal limits  URINE CULTURE  CULTURE, BLOOD (SINGLE)  MAGNESIUM  PROCALCITONIN  CBG MONITORING, ED  TROPONIN I (HIGH SENSITIVITY)   ____________________________________________  EKG  Sinus rhythm with a ventricular rate of 85, normal axis and multiple nonspecific ST changes more pronounced in inferior leads.  Somewhat low amplitude throughout. ____________________________________________  RADIOLOGY  ED MD interpretation: CT head shows postoperative changes and interval evacuation of previously large subdural and small chronic subdural still present without any evidence of recurrence or other acute CVA or other acute process.  Chest x-ray has evidence of bilateral pleural effusions and some atelectasis.  No focal consolidation, overt edema pneumothorax or other acute thoracic process.  CT abdomen pelvis remarkable for bilateral pleural effusions as well as moderate abdominal and pelvic ascites soft  tissue edema.  There is likely intrarenal stones bilaterally possibly stone in the bladder.  There is evidence of diverticuli without evidence of diverticulitis.  No evidence of SBO, appendicitis, cholecystitis, pancreatitis or other acute abdominopelvic process.  Official radiology report(s): CT ABDOMEN PELVIS WO CONTRAST  Result Date: 12/22/2020 CLINICAL DATA:  Acute nonlocalized abdominal pain. EXAM: CT ABDOMEN AND PELVIS WITHOUT CONTRAST TECHNIQUE: Multidetector CT imaging of the abdomen and pelvis was performed following the standard protocol without IV contrast. COMPARISON:  09/27/2014 FINDINGS: Lower chest: Moderate to large bilateral pleural effusions with basilar atelectasis. Hepatobiliary: No focal liver abnormality is seen. Status post cholecystectomy. No biliary dilatation. Pancreas: Unremarkable. No pancreatic ductal dilatation or surrounding inflammatory changes. Spleen: Normal in size without focal abnormality. Adrenals/Urinary Tract: No adrenal gland nodules. Increased density in the kidneys could represent intrarenal stones or more likely residual contrast material from prior contrast  administration. No hydronephrosis or hydroureter. Bladder is decompressed. There appears to be a stone in the dependent bladder on the right. Stomach/Bowel: Stomach, small bowel, and colon are mostly decompressed. Scattered colonic diverticula without evidence of diverticulitis. Appendix is not identified. Vascular/Lymphatic: Scattered aortic calcification. No aneurysm. Scattered retroperitoneal lymph nodes without pathologic enlargement. Reproductive: Status post hysterectomy. No adnexal masses. Other: Moderate free fluid in the abdomen and pelvis, likely ascites. Diffuse edema throughout the subcutaneous fatty tissues of the abdomen, pelvis, and visualized lower chest. No free air. Musculoskeletal: Previous internal fixation of the right hip. Degenerative changes in the spine. No destructive bone lesions.  IMPRESSION: 1. Moderate bilateral pleural effusions with basilar atelectasis. 2. Moderate abdominal and pelvic ascites. 3. Diffuse soft tissue edema. 4. Increased density in the kidneys may represent multiple intrarenal stones or residual contrast material. Suggestion of a stone in the bladder. 5. Colonic diverticula without evidence of diverticulitis. 6. Aortic atherosclerosis. Electronically Signed   By: Lucienne Capers M.D.   On: 12/22/2020 18:04   DG Chest 2 View  Result Date: 12/22/2020 CLINICAL DATA:  Syncopal episode. EXAM: CHEST - 2 VIEW COMPARISON:  11/07/2020 FINDINGS: Right central venous catheter with tip over the low SVC region. Shallow inspiration. Heart size and pulmonary vascularity are normal for technique. There are bilateral pleural effusions, greater on the left. No perihilar edema or infiltration. Atelectasis in the lung bases. Mediastinal contours appear intact. IMPRESSION: Bilateral pleural effusions with basilar atelectasis, greater on the left. Electronically Signed   By: Lucienne Capers M.D.   On: 12/22/2020 18:17   CT HEAD WO CONTRAST (5MM)  Result Date: 12/22/2020 CLINICAL DATA:  Cerebral hemorrhage suspected. Syncopal episode. Witnessed syncopal episode for 45 seconds during dialysis. EXAM: CT HEAD WITHOUT CONTRAST TECHNIQUE: Contiguous axial images were obtained from the base of the skull through the vertex without intravenous contrast. COMPARISON:  07/25/2020 FINDINGS: Brain: Since the previous study there has been interval operative evacuation of the large subdural hematoma previously seen. There is a small chronic subdural fluid collection demonstrated today. Mild thickening of the dura is likely postoperative. No recurrent hemorrhage is identified. Chronic atrophy. Ventricular dilatation consistent with central atrophy. Low-attenuation changes in the deep white matter consistent small vessel ischemia. Vascular: Moderate intracranial vascular calcifications. Skull:  Postoperative left frontoparietal craniotomy with plate and screw fixation of the bone flap. Sinuses/Orbits: Paranasal sinuses and mastoid air cells are clear. Other: None. IMPRESSION: 1. Postoperative changes with interval evacuation of previous large subdural hematoma. Postoperative changes and small chronic subdural collection seen today. No evidence of recurrent intracranial hemorrhage. 2. Chronic atrophy and small vessel ischemic changes. Electronically Signed   By: Lucienne Capers M.D.   On: 12/22/2020 17:59    ____________________________________________   PROCEDURES  Procedure(s) performed (including Critical Care):  Procedures   ____________________________________________   INITIAL IMPRESSION / ASSESSMENT AND PLAN / ED COURSE      Patient presents with above-stated history and exam for a witnessed syncopal episode that occurred at dialysis earlier today preceded by some nausea and vomiting and abdominal pain.  Patient states he does not remember exactly what happened and states she currently has no nausea or abdominal pain.  On arrival she is afebrile and hemodynamically stable.  She has some weakness in her lower extremities which seems to be chronic but otherwise no convincing evidence of acute neurological deficit.  Initial differential is quite broad and includes arrhythmia, acute anemia, vasovagal syncope, SAH, CVA, metabolic derangements, dehydration, and PE.  No seizure like activity  was reported patient is on several antiepileptics and is also possible she had nonconvulsive seizure.  CBC remarkable for fairly substantial leukocytosis with a WBC count of 48 with normal hemoglobin and platelets.  BMP remarkable for some hyponatremia which appears chronic with a sodium of 130 compared to 127 1 month ago, K of 3 and creatinine at baseline without any other significant derangements.  Glucose is 89.  ECG and nonelevated troponin x2 are not suggestive of ACS or arrhythmia.  BNP is  elevated at 618 compared to 129 1 month ago and fits patient's clinical picture consistent with gross volume overload.  [place] function panel has no evidence of hepatitis and normal bilirubin.  CT head shows postoperative changes and interval evacuation of previously large subdural and small chronic subdural still present without any evidence of recurrence or other acute CVA or other acute process.  Chest x-ray has evidence of bilateral pleural effusions and some atelectasis.  No focal consolidation, overt edema pneumothorax or other acute thoracic process.  CT abdomen pelvis remarkable for bilateral pleural effusions as well as moderate abdominal and pelvic ascites soft tissue edema.  There is likely intrarenal stones bilaterally possibly stone in the bladder.  There is evidence of diverticuli without evidence of diverticulitis.  No evidence of SBO, appendicitis, cholecystitis, pancreatitis or other acute abdominopelvic process.  Patient's potassium repleted.  She was given a small dose of Lasix given concern for volume overload-she still has some urine.  Patient initially was very resistant to In-N-Out catheter attempt after observation for over 7 hours and no urine produced on external catheter she was amenable to an attempt.  Urine appears grossly cloudy on UA.  Will obtain a blood and urine culture as well.  UA is concerning for cystitis.  Will send urine culture and blood culture.  We will give a dose of Rocephin after discussion with pharmacy as patient has tolerated Augmentin recently.  Will admit to hospital service for further evaluation and management. ____________________________________________   FINAL CLINICAL IMPRESSION(S) / ED DIAGNOSES  Final diagnoses:  Syncope and collapse  Hypokalemia  Chronic hyponatremia  ESRD (end stage renal disease) (HCC)  Leukocytosis, unspecified type  Acute cystitis without hematuria    Medications  cefTRIAXone (ROCEPHIN) 1 g in sodium chloride 0.9  % 100 mL IVPB (has no administration in time range)  furosemide (LASIX) injection 20 mg (20 mg Intravenous Given 12/22/20 1852)  potassium chloride SA (KLOR-CON) CR tablet 40 mEq (40 mEq Oral Given 12/22/20 1853)     ED Discharge Orders     None        Note:  This document was prepared using Dragon voice recognition software and may include unintentional dictation errors.    Lucrezia Starch, MD 12/22/20 Lona Kettle    Lucrezia Starch, MD 12/22/20 1958    Lucrezia Starch, MD 12/22/20 2016

## 2020-12-22 NOTE — ED Notes (Signed)
Resumed care from kelly rn.  Pt alert.  Family with pt.  Pt waiting on admission.

## 2020-12-23 ENCOUNTER — Encounter: Payer: Self-pay | Admitting: Internal Medicine

## 2020-12-23 ENCOUNTER — Other Ambulatory Visit (INDEPENDENT_AMBULATORY_CARE_PROVIDER_SITE_OTHER): Payer: Self-pay | Admitting: Vascular Surgery

## 2020-12-23 DIAGNOSIS — I10 Essential (primary) hypertension: Secondary | ICD-10-CM | POA: Diagnosis not present

## 2020-12-23 DIAGNOSIS — Z8249 Family history of ischemic heart disease and other diseases of the circulatory system: Secondary | ICD-10-CM | POA: Diagnosis not present

## 2020-12-23 DIAGNOSIS — N186 End stage renal disease: Secondary | ICD-10-CM

## 2020-12-23 DIAGNOSIS — N3000 Acute cystitis without hematuria: Secondary | ICD-10-CM | POA: Diagnosis present

## 2020-12-23 DIAGNOSIS — Z66 Do not resuscitate: Secondary | ICD-10-CM | POA: Diagnosis present

## 2020-12-23 DIAGNOSIS — R601 Generalized edema: Secondary | ICD-10-CM

## 2020-12-23 DIAGNOSIS — Z20822 Contact with and (suspected) exposure to covid-19: Secondary | ICD-10-CM | POA: Diagnosis present

## 2020-12-23 DIAGNOSIS — D72829 Elevated white blood cell count, unspecified: Secondary | ICD-10-CM | POA: Diagnosis not present

## 2020-12-23 DIAGNOSIS — E876 Hypokalemia: Secondary | ICD-10-CM

## 2020-12-23 DIAGNOSIS — I2510 Atherosclerotic heart disease of native coronary artery without angina pectoris: Secondary | ICD-10-CM | POA: Diagnosis present

## 2020-12-23 DIAGNOSIS — N2581 Secondary hyperparathyroidism of renal origin: Secondary | ICD-10-CM | POA: Diagnosis present

## 2020-12-23 DIAGNOSIS — E871 Hypo-osmolality and hyponatremia: Secondary | ICD-10-CM | POA: Diagnosis present

## 2020-12-23 DIAGNOSIS — G40909 Epilepsy, unspecified, not intractable, without status epilepticus: Secondary | ICD-10-CM | POA: Diagnosis present

## 2020-12-23 DIAGNOSIS — F32A Depression, unspecified: Secondary | ICD-10-CM | POA: Diagnosis present

## 2020-12-23 DIAGNOSIS — I132 Hypertensive heart and chronic kidney disease with heart failure and with stage 5 chronic kidney disease, or end stage renal disease: Secondary | ICD-10-CM | POA: Diagnosis present

## 2020-12-23 DIAGNOSIS — R55 Syncope and collapse: Secondary | ICD-10-CM

## 2020-12-23 DIAGNOSIS — I5042 Chronic combined systolic (congestive) and diastolic (congestive) heart failure: Secondary | ICD-10-CM | POA: Diagnosis present

## 2020-12-23 DIAGNOSIS — I248 Other forms of acute ischemic heart disease: Secondary | ICD-10-CM | POA: Diagnosis present

## 2020-12-23 DIAGNOSIS — Z8619 Personal history of other infectious and parasitic diseases: Secondary | ICD-10-CM | POA: Diagnosis not present

## 2020-12-23 DIAGNOSIS — I5022 Chronic systolic (congestive) heart failure: Secondary | ICD-10-CM

## 2020-12-23 DIAGNOSIS — Z86010 Personal history of colonic polyps: Secondary | ICD-10-CM | POA: Diagnosis not present

## 2020-12-23 DIAGNOSIS — D631 Anemia in chronic kidney disease: Secondary | ICD-10-CM | POA: Diagnosis present

## 2020-12-23 DIAGNOSIS — Z992 Dependence on renal dialysis: Secondary | ICD-10-CM | POA: Diagnosis not present

## 2020-12-23 DIAGNOSIS — R188 Other ascites: Secondary | ICD-10-CM | POA: Diagnosis present

## 2020-12-23 DIAGNOSIS — I953 Hypotension of hemodialysis: Secondary | ICD-10-CM | POA: Diagnosis present

## 2020-12-23 DIAGNOSIS — J9811 Atelectasis: Secondary | ICD-10-CM | POA: Diagnosis present

## 2020-12-23 DIAGNOSIS — N3001 Acute cystitis with hematuria: Secondary | ICD-10-CM | POA: Diagnosis present

## 2020-12-23 DIAGNOSIS — I120 Hypertensive chronic kidney disease with stage 5 chronic kidney disease or end stage renal disease: Secondary | ICD-10-CM | POA: Diagnosis not present

## 2020-12-23 DIAGNOSIS — E785 Hyperlipidemia, unspecified: Secondary | ICD-10-CM | POA: Diagnosis present

## 2020-12-23 DIAGNOSIS — K5730 Diverticulosis of large intestine without perforation or abscess without bleeding: Secondary | ICD-10-CM | POA: Diagnosis present

## 2020-12-23 DIAGNOSIS — I255 Ischemic cardiomyopathy: Secondary | ICD-10-CM | POA: Diagnosis present

## 2020-12-23 LAB — BASIC METABOLIC PANEL
Anion gap: 11 (ref 5–15)
BUN: 17 mg/dL (ref 8–23)
CO2: 24 mmol/L (ref 22–32)
Calcium: 8.5 mg/dL — ABNORMAL LOW (ref 8.9–10.3)
Chloride: 96 mmol/L — ABNORMAL LOW (ref 98–111)
Creatinine, Ser: 3.17 mg/dL — ABNORMAL HIGH (ref 0.44–1.00)
GFR, Estimated: 15 mL/min — ABNORMAL LOW (ref >60)
Glucose, Bld: 76 mg/dL (ref 70–99)
Potassium: 4.1 mmol/L (ref 3.5–5.1)
Sodium: 131 mmol/L — ABNORMAL LOW (ref 135–145)

## 2020-12-23 LAB — CBC
HCT: 40.6 % (ref 36.0–46.0)
Hemoglobin: 12.4 g/dL (ref 12.0–15.0)
MCH: 27 pg (ref 26.0–34.0)
MCHC: 30.5 g/dL (ref 30.0–36.0)
MCV: 88.5 fL (ref 80.0–100.0)
Platelets: 259 10*3/uL (ref 150–400)
RBC: 4.59 MIL/uL (ref 3.87–5.11)
RDW: 20.3 % — ABNORMAL HIGH (ref 11.5–15.5)
WBC: 22.5 10*3/uL — ABNORMAL HIGH (ref 4.0–10.5)
nRBC: 0 % (ref 0.0–0.2)

## 2020-12-23 LAB — RESP PANEL BY RT-PCR (FLU A&B, COVID) ARPGX2
Influenza A by PCR: NEGATIVE
Influenza B by PCR: NEGATIVE
SARS Coronavirus 2 by RT PCR: NEGATIVE

## 2020-12-23 LAB — MRSA NEXT GEN BY PCR, NASAL: MRSA by PCR Next Gen: NOT DETECTED

## 2020-12-23 MED ORDER — CALCIUM CARBONATE ANTACID 500 MG PO CHEW: 750.0000 mg | CHEWABLE_TABLET | ORAL | Status: DC | PRN

## 2020-12-23 MED ORDER — SERTRALINE HCL 50 MG PO TABS
50.0000 mg | ORAL_TABLET | Freq: Every day | ORAL | Status: DC
  Administered 2020-12-23 – 2020-12-25 (×3): 50 mg via ORAL
  Filled 2020-12-23 (×4): qty 1

## 2020-12-23 MED ORDER — CHLORHEXIDINE GLUCONATE CLOTH 2 % EX PADS
6.0000 | MEDICATED_PAD | Freq: Every day | CUTANEOUS | Status: DC
  Administered 2020-12-23 – 2020-12-24 (×2): 6 via TOPICAL
  Filled 2020-12-23: qty 6

## 2020-12-23 MED ORDER — NITROGLYCERIN 0.4 MG SL SUBL: 0.4000 mg | SUBLINGUAL_TABLET | SUBLINGUAL | Status: DC | PRN

## 2020-12-23 NOTE — Progress Notes (Addendum)
OT Cancellation Note  Patient Details Name: Amy [name] MRN: [id number] DOB: 09-03-1950   Cancelled Treatment:    Reason Eval/Treat Not Completed: Patient declined, no reason specified. Consult received, chart reviewed. Upon attempt, pt eating breakfast. Will re-attempt OT evaluation at later time as pt is available.  Addendum 1:09pm: pt off the floor. Will re-attempt OT evaluation at later date/time as pt is available.    Hanley Hays, MPH, MS, OTR/L ascom [phone] 12/23/20, 9:42 AM

## 2020-12-23 NOTE — H&P (View-Only) (Signed)
[facility] Vascular Consult Note  MRN : [id number]  Amy [name] is a 70 y.o. (January 14, 1951) female who presents with chief complaint of  Chief Complaint  Patient presents with   Loss of Consciousness   History of Present Illness:  Amy [name] is a 70 y.o. female with medical history significant for ESRD on MWF HD, HFrEF (EF 25%), severe diffuse CAD s/p DES proximal LAD (06/27/2020), SDH s/p partial craniotomy and evacuation (07/25/2020), seizure disorder (on Briviact and Vimpat), HTN, HLD, chronic hyponatremia, anemia of chronic disease, GERD, L4-L5 stenosis, and depression/anxiety who presented to the ED for evaluation after a syncopal episode while at dialysis.  Patient went to her usual dialysis session this morning (12/22/2020).  After about 50 minutes into her dialysis treatment she had lost consciousness/had a syncopal episode.  Patient does not remember what happened.  Dialysis staff reported that patient was feeling nauseous and had some emesis prior to her syncopal episode.  She did not lose pulse or require CPR.  She regained consciousness after about 45 seconds.  She was subsequently sent to the ED for further evaluation.  Patient feels that she is near her baseline status now.  She does recall having some mild nausea over the last 2 days without any emesis during that time.  She has chronic swelling in her legs which is unchanged.  She reports occasional dyspnea with exertion.  She has significant deconditioning/generalized weakness due to her chronic medical issues and hospitalizations over the last year and currently requires a wheelchair for transport.  She does report becoming lightheaded/dizzy when she sits up after laying down for period of time.  She says this resolves after resting for a moment.  She has not had any recent falls.  She denies any chest pain, cough, abdominal pain.   She does report recent dysuria and was started on an antibiotic 2 days ago  for suspected UTI.  She denies any fevers, chills, diaphoresis, diarrhea.  Vascular surgery was consulted by NP breeze for PermCath exchange due to a nonfunctioning PermCath.  Patient underwent dialysis this a.m. The arterial line was able to pull back unable to flush the venous line.  Current Facility-Administered Medications  Medication Dose Route Frequency Provider Last Rate Last Admin   acetaminophen (TYLENOL) tablet 650 mg  650 mg Oral Q6H PRN Lenore Cordia, MD       Or   acetaminophen (TYLENOL) suppository 650 mg  650 mg Rectal Q6H PRN Lenore Cordia, MD       aspirin chewable tablet 81 mg  81 mg Oral Daily Lenore Cordia, MD   81 mg at 12/23/20 1145   atorvastatin (LIPITOR) tablet 40 mg  40 mg Oral Daily Lenore Cordia, MD   40 mg at 12/23/20 1145   brivaracetam (BRIVIACT) tablet 75 mg  75 mg Oral BID Lenore Cordia, MD       calcium carbonate (TUMS - dosed in mg elemental calcium) chewable tablet 750-1,500 mg  750-1,500 mg Oral PRN Wieting, Richard, MD       cefTRIAXone (ROCEPHIN) 1 g in sodium chloride 0.9 % 100 mL IVPB  1 g Intravenous Q24H Lenore Cordia, MD       Chlorhexidine Gluconate Cloth 2 % PADS 6 each  6 each Topical Q0600 Kolluru, Sarath, MD   6 each at 12/23/20 1443   lacosamide (VIMPAT) tablet 50 mg  50 mg Oral BID Lenore Cordia, MD   50 mg  at 12/23/20 1145   melatonin tablet 5 mg  5 mg Oral QHS Zada Finders R, MD   5 mg at 12/23/20 0022   metoprolol tartrate (LOPRESSOR) tablet 12.5 mg  12.5 mg Oral BID Zada Finders R, MD   12.5 mg at 12/23/20 1146   multivitamin (RENA-VIT) tablet 1 tablet  1 tablet Oral QHS Lenore Cordia, MD       nitroGLYCERIN (NITROSTAT) SL tablet 0.4 mg  0.4 mg Sublingual Q5 min PRN Loletha Grayer, MD       ondansetron [facility]) tablet 4 mg  4 mg Oral Q6H PRN Lenore Cordia, MD       Or   ondansetron (ZOFRAN) injection 4 mg  4 mg Intravenous Q6H PRN Lenore Cordia, MD       pantoprazole (PROTONIX) EC tablet 40 mg  40 mg Oral Daily  Zada Finders R, MD   40 mg at 12/23/20 1145   senna-docusate (Senokot-S) tablet 1 tablet  1 tablet Oral QHS PRN Lenore Cordia, MD       sertraline (ZOLOFT) tablet 50 mg  50 mg Oral Daily Wieting, Richard, MD   50 mg at 12/23/20 1443   sodium chloride flush (NS) 0.9 % injection 3 mL  3 mL Intravenous Q12H Zada Finders R, MD   3 mL at 12/23/20 1146   traZODone (DESYREL) tablet 25-50 mg  25-50 mg Oral QHS PRN Lenore Cordia, MD       Past Medical History:  Diagnosis Date   Anemia    a. 08/2016 Guaiac + stool. EGD w/ gastritis.   Anxiety    Closed fracture of shaft of left ulna 11/27/2018   Colon polyp    a. 08/2016 Colonoscopy.   ESRD on peritoneal dialysis ([place])    Gastritis    a. 08/2016 EGD: Gastritis-->PPI.   GERD (gastroesophageal reflux disease)    History of chicken pox    History of measles as a child    Hypertension    Hyponatremia    a. 08/2016 in setting of HCTZ Rx.   Leukocytosis    Mesenteric lymphadenopathy 04/08/2017   Multiple thyroid nodules    Past Surgical History:  Procedure Laterality Date   ABDOMINAL HYSTERECTOMY  2003   due to heavy periods and clotting during menses   BREAST LUMPECTOMY Left 1990   CHOLECYSTECTOMY  2010   COLONOSCOPY WITH PROPOFOL N/A 09/07/2016   Procedure: COLONOSCOPY WITH PROPOFOL;  Surgeon: Lucilla Lame, MD;  Location: [facility] ENDOSCOPY;  Service: Endoscopy;  Laterality: N/A;   CORONARY STENT INTERVENTION N/A 06/27/2020   Procedure: CORONARY STENT INTERVENTION;  Surgeon: Sherren Mocha, MD;  Location: [place] [facility];  Service: Cardiovascular;  Laterality: N/A;   DIALYSIS/PERMA CATHETER INSERTION N/A 11/16/2018   Procedure: DIALYSIS/PERMA CATHETER INSERTION;  Surgeon: Algernon Huxley, MD;  Location: [place] [facility];  Service: Cardiovascular;  Laterality: N/A;   DIALYSIS/PERMA CATHETER INSERTION N/A 11/10/2020   Procedure: DIALYSIS/PERMA CATHETER EXCHANGE;  Surgeon: Algernon Huxley, MD;  Location: [place] [facility];  Service:  Cardiovascular;  Laterality: N/A;   DIALYSIS/PERMA CATHETER REMOVAL N/A 08/16/2019   Procedure: DIALYSIS/PERMA CATHETER REMOVAL;  Surgeon: Algernon Huxley, MD;  Location: [place] [facility];  Service: Cardiovascular;  Laterality: N/A;   DILATION AND CURETTAGE OF UTERUS  1985   ESOPHAGOGASTRODUODENOSCOPY (EGD) WITH PROPOFOL N/A 09/07/2016   Procedure: ESOPHAGOGASTRODUODENOSCOPY (EGD) WITH PROPOFOL;  Surgeon: Lucilla Lame, MD;  Location: [facility] ENDOSCOPY;  Service: Endoscopy;  Laterality: N/A;  GALLBLADDER SURGERY  2012   INTRAMEDULLARY (IM) NAIL INTERTROCHANTERIC Right 04/23/2020   Procedure: INTRAMEDULLARY (IM) NAIL INTERTROCHANTRIC;  Surgeon: Thornton Park, MD;  Location: [facility] OR[facility];  Service: Orthopedics;  Laterality: Right;   INTRAVASCULAR IMAGING/OCT N/A 06/27/2020   Procedure: INTRAVASCULAR IMAGING/OCT;  Surgeon: Sherren Mocha, MD;  Location: [place] [facility];  Service: Cardiovascular;  Laterality: N/A;   IR FLUORO GUIDE CV LINE RIGHT  09/04/2020   IR US GUIDE VASC ACCESS RIGHT  09/04/2020   KNEE SURGERY     Dr. Tamala Julian   LEFT HEART CATH AND CORONARY ANGIOGRAPHY N/A 06/25/2020   Procedure: LEFT HEART CATH AND CORONARY ANGIOGRAPHY;  Surgeon: Troy Sine, MD;  Location: [place] [facility];  Service: Cardiovascular;  Laterality: N/A;   TONSILLECTOMY     TONSILLECTOMY AND ADENOIDECTOMY  1970   UPPER GI ENDOSCOPY  03/02/2006   H. Pylori negative; Dr. Allen Norris   Social History Social History   Tobacco Use   Smoking status: Never   Smokeless tobacco: Never  Vaping Use   Vaping Use: Never used  Substance Use Topics   Alcohol use: Yes    Alcohol/week: 0.0 - 2.0 standard drinks   Drug use: No   Family History Family History  Problem Relation Age of Onset   Pancreatic cancer Father    Congestive Heart Failure Maternal Grandmother    Cancer Paternal Grandfather    Heart disease Maternal Uncle   Denies family history of renal disease, peripheral artery disease or venous  disease.  Allergies  Allergen Reactions   Cephalosporins Hives and Swelling   Clarithromycin Other (See Comments)    Unknown reaction   Ensure [Nutritional Supplements] Nausea And Vomiting   Gabapentin    Lunesta  [Eszopiclone]     Heart racing   Nepro [Compleat] Nausea And Vomiting   Penicillin V     Has patient had a PCN reaction causing immediate rash, facial/tongue/throat swelling, SOB or lightheadedness with hypotension: Yes Has patient had a PCN reaction causing severe rash involving mucus membranes or skin necrosis: No Has patient had a PCN reaction that required hospitalization No Has patient had a PCN reaction occurring within the last 10 years: No If all of the above answers are "NO", then may proceed with Cephalosporin use.   Sulfa Antibiotics     Unknown reaction   REVIEW OF SYSTEMS (Negative unless checked)  Constitutional: [] Weight loss  [] Fever  [] Chills Cardiac: [] Chest pain   [] Chest pressure   [] Palpitations   [] Shortness of breath when laying flat   [] Shortness of breath at rest   [x] Shortness of breath with exertion. Vascular:  [] Pain in legs with walking   [] Pain in legs at rest   [] Pain in legs when laying flat   [] Claudication   [] Pain in feet when walking  [] Pain in feet at rest  [] Pain in feet when laying flat   [] History of DVT   [] Phlebitis   [x] Swelling in legs   [] Varicose veins   [] Non-healing ulcers Pulmonary:   [] Uses home oxygen   [] Productive cough   [] Hemoptysis   [] Wheeze  [] COPD   [] Asthma Neurologic:  [] Dizziness  [] Blackouts   [] Seizures   [] History of stroke   [] History of TIA  [] Aphasia   [] Temporary blindness   [] Dysphagia   [] Weakness or numbness in arms   [] Weakness or numbness in legs Musculoskeletal:  [] Arthritis   [] Joint swelling   [] Joint pain   [] Low back pain Hematologic:  [] Easy bruising  [] Easy bleeding   []   Hypercoagulable state   [] Anemic  [] Hepatitis Gastrointestinal:  [] Blood in stool   [] Vomiting blood  [] Gastroesophageal  reflux/heartburn   [] Difficulty swallowing. Genitourinary:  [x] Chronic kidney disease   [] Difficult urination  [] Frequent urination  [] Burning with urination   [] Blood in urine Skin:  [] Rashes   [] Ulcers   [] Wounds Psychological:  [] History of anxiety   []  History of major depression.  Physical Examination  Vitals:   12/23/20 0600 12/23/20 0700 12/23/20 1230 12/23/20 1455  BP: (!) 154/83 (!) 156/88  (!) 177/82  Pulse: 80 81 72 77  Resp: 16 19 (!) 22 18  Temp:    98.6 F (37 C)  TempSrc:    Oral  SpO2: 96% 98% 97% 97%  Weight:    52.5 kg  Height:    5\' 4"  (1.626 m)   Body mass index is 19.87 kg/m. Gen:  WD/WN, NAD Head: [place]/AT, No temporalis wasting. Prominent temp pulse not noted. Ear/Nose/Throat: Hearing grossly intact, nares w/o erythema or drainage, oropharynx w/o Erythema/Exudate Eyes: Sclera non-icteric, conjunctiva clear Neck: Trachea midline.  No JVD.  Pulmonary:  Good air movement, respirations not labored, equal bilaterally.  Cardiac: RRR, normal S1, S2. Vascular:  Vessel Right Left  Radial Palpable Palpable  Ulnar Palpable Palpable                               Gastrointestinal: soft, non-tender/non-distended. No guarding/reflex.  Musculoskeletal: M/S 5/5 throughout.  Extremities without ischemic changes.  No deformity or atrophy. No edema. Neurologic: Sensation grossly intact in extremities.  Symmetrical.  Speech is fluent. Motor exam as listed above. Psychiatric: Judgment intact, Mood & affect appropriate for pt's clinical situation. Dermatologic: No rashes or ulcers noted.  No cellulitis or open wounds. Lymph : No Cervical, Axillary, or Inguinal lymphadenopathy.  CBC Lab Results  Component Value Date   WBC 22.5 (H) 12/23/2020   HGB 12.4 12/23/2020   HCT 40.6 12/23/2020   MCV 88.5 12/23/2020   PLT 259 12/23/2020   BMET    Component Value Date/Time   NA 131 (L) 12/23/2020 0615   NA 135 11/08/2018 1620   K 4.1 12/23/2020 0615   CL 96 (L)  12/23/2020 0615   CO2 24 12/23/2020 0615   GLUCOSE 76 12/23/2020 0615   BUN 17 12/23/2020 0615   BUN 77 (HH) 11/08/2018 1620   CREATININE 3.17 (H) 12/23/2020 0615   CREATININE 1.93 (H) 04/05/2017 1451   CALCIUM 8.5 (L) 12/23/2020 0615   CALCIUM 8.2 (L) 08/13/2020 0739   GFRNONAA 15 (L) 12/23/2020 0615   GFRAA 7 (L) 12/17/2019 0444   Estimated Creatinine Clearance: 13.3 mL/min (A) (by C-G formula based on SCr of 3.17 mg/dL (H)).  COAG Lab Results  Component Value Date   INR 1.1 09/02/2020   INR 1.1 06/19/2020   INR 1.2 04/04/2019   Radiology CT ABDOMEN PELVIS WO CONTRAST  Result Date: 12/22/2020 CLINICAL DATA:  Acute nonlocalized abdominal pain. EXAM: CT ABDOMEN AND PELVIS WITHOUT CONTRAST TECHNIQUE: Multidetector CT imaging of the abdomen and pelvis was performed following the standard protocol without IV contrast. COMPARISON:  09/27/2014 FINDINGS: Lower chest: Moderate to large bilateral pleural effusions with basilar atelectasis. Hepatobiliary: No focal liver abnormality is seen. Status post cholecystectomy. No biliary dilatation. Pancreas: Unremarkable. No pancreatic ductal dilatation or surrounding inflammatory changes. Spleen: Normal in size without focal abnormality. Adrenals/Urinary Tract: No adrenal gland nodules. Increased density in the kidneys could represent intrarenal stones or more likely  residual contrast material from prior contrast administration. No hydronephrosis or hydroureter. Bladder is decompressed. There appears to be a stone in the dependent bladder on the right. Stomach/Bowel: Stomach, small bowel, and colon are mostly decompressed. Scattered colonic diverticula without evidence of diverticulitis. Appendix is not identified. Vascular/Lymphatic: Scattered aortic calcification. No aneurysm. Scattered retroperitoneal lymph nodes without pathologic enlargement. Reproductive: Status post hysterectomy. No adnexal masses. Other: Moderate free fluid in the abdomen and  pelvis, likely ascites. Diffuse edema throughout the subcutaneous fatty tissues of the abdomen, pelvis, and visualized lower chest. No free air. Musculoskeletal: Previous internal fixation of the right hip. Degenerative changes in the spine. No destructive bone lesions. IMPRESSION: 1. Moderate bilateral pleural effusions with basilar atelectasis. 2. Moderate abdominal and pelvic ascites. 3. Diffuse soft tissue edema. 4. Increased density in the kidneys may represent multiple intrarenal stones or residual contrast material. Suggestion of a stone in the bladder. 5. Colonic diverticula without evidence of diverticulitis. 6. Aortic atherosclerosis. Electronically Signed   By: Lucienne Capers M.D.   On: 12/22/2020 18:04   DG Chest 2 View  Result Date: 12/22/2020 CLINICAL DATA:  Syncopal episode. EXAM: CHEST - 2 VIEW COMPARISON:  11/07/2020 FINDINGS: Right central venous catheter with tip over the low SVC region. Shallow inspiration. Heart size and pulmonary vascularity are normal for technique. There are bilateral pleural effusions, greater on the left. No perihilar edema or infiltration. Atelectasis in the lung bases. Mediastinal contours appear intact. IMPRESSION: Bilateral pleural effusions with basilar atelectasis, greater on the left. Electronically Signed   By: Lucienne Capers M.D.   On: 12/22/2020 18:17   CT HEAD WO CONTRAST (5MM)  Result Date: 12/22/2020 CLINICAL DATA:  Cerebral hemorrhage suspected. Syncopal episode. Witnessed syncopal episode for 45 seconds during dialysis. EXAM: CT HEAD WITHOUT CONTRAST TECHNIQUE: Contiguous axial images were obtained from the base of the skull through the vertex without intravenous contrast. COMPARISON:  07/25/2020 FINDINGS: Brain: Since the previous study there has been interval operative evacuation of the large subdural hematoma previously seen. There is a small chronic subdural fluid collection demonstrated today. Mild thickening of the dura is likely  postoperative. No recurrent hemorrhage is identified. Chronic atrophy. Ventricular dilatation consistent with central atrophy. Low-attenuation changes in the deep white matter consistent small vessel ischemia. Vascular: Moderate intracranial vascular calcifications. Skull: Postoperative left frontoparietal craniotomy with plate and screw fixation of the bone flap. Sinuses/Orbits: Paranasal sinuses and mastoid air cells are clear. Other: None. IMPRESSION: 1. Postoperative changes with interval evacuation of previous large subdural hematoma. Postoperative changes and small chronic subdural collection seen today. No evidence of recurrent intracranial hemorrhage. 2. Chronic atrophy and small vessel ischemic changes. Electronically Signed   By: Lucienne Capers M.D.   On: 12/22/2020 17:59    Assessment/Plan The patient is a 70 year old female with multiple medical issues including known end-stage renal disease currently maintained on hemodialysis who presents with a nonfunctioning PermCath.  1.  Nonfunctioning PermCath: Patient has a known history of end-stage renal disease currently maintained on hemodialysis.  The patient went to dialysis this a.m. and was unable to dialyze.  The patient's arterial line was able to flush however the patient's venous line did not pull back.  The patient is currently unable to adequately dialyze and therefore she will need a new access.  We will plan on PermCath exchange tomorrow with Dr. Lucky Cowboy.  Procedure, risks and benefits were signed to the patient.  All questions were answered.  Patient was to proceed.  2.  End-stage renal disease:  The patient has a history of known end-stage renal disease currently maintained on hemodialysis.  Currently the patient's PermCath is not functioning and therefore she does not have an adequate dialysis access. We will plan on PermCath exchange tomorrow with Dr. Lucky Cowboy Nephrology following  3.  Hyperlipidemia: On Lipitor for medical  management Encouraged good control as its slows the progression of atherosclerotic disease   Discussed with Dr. Mayme Genta, PA-C 12/23/2020 3:46 PM  This note was created with Dragon medical transcription system.  Any error is purely unintentional.

## 2020-12-23 NOTE — Progress Notes (Signed)
Pt arrived to dialysis suite at 1310. Assessed pt, pt alert and oriented x3, resps unlabored, diminished, skin pale and dry. Pt had a gauze dressing in place over cvc insertion site. No redness noted to insertion site. Occulusive dressing with biopatch placed. When accessing CVC, arterial line has patent return of blood and easy flushing. When accessing venous line, aspirated approx 13mL of blood mixed with air. Air is coming from venous line when pulling back. [place], np notified, will have vascular see pt for line patency, venous line not flushed back due to risk of air emobilism. Line clamped and capped.

## 2020-12-23 NOTE — Progress Notes (Signed)
Patient ID: Amy [name], female   DOB: [birth year], 70 y.o.   MRN: [id number] [facility] PROGRESS NOTE  AROURA VASUDEVAN DJM:[id number] DOB: [birth year] DOA: 12/22/2020 PCP: Murlean Iba, MD  HPI/Subjective: Patient had a syncopal episode and dialysis.  Patient has some lower abdominal pain and burning on urination.  He admitted with syncopal episode during dialysis and had high blood pressure at that time and also found to have a urinary tract infection.  Was on Macrodantin as outpatient  Objective: Vitals:   12/23/20 0600 12/23/20 0700  BP: (!) 154/83 (!) 156/88  Pulse: 80 81  Resp: 16 19  Temp:    SpO2: 96% 98%    ROS: Review of Systems  Respiratory:  Negative for shortness of breath.   Cardiovascular:  Negative for chest pain.  Gastrointestinal:  Negative for abdominal pain, nausea and vomiting.  Genitourinary:  Positive for dysuria.  Exam: Physical Exam HENT:     Head: Normocephalic.     Mouth/Throat:     Pharynx: No oropharyngeal exudate.  Eyes:     General: Lids are normal.     Conjunctiva/sclera: Conjunctivae normal.  Cardiovascular:     Rate and Rhythm: Normal rate and regular rhythm.     Heart sounds: Normal heart sounds, S1 normal and S2 normal.  Pulmonary:     Breath sounds: Normal breath sounds. No decreased breath sounds, wheezing, rhonchi or rales.  Abdominal:     Palpations: Abdomen is soft.     Tenderness: There is abdominal tenderness in the suprapubic area.  Musculoskeletal:     Right lower leg: Swelling present.     Left lower leg: Swelling present.  Skin:    General: Skin is warm.     Comments: Lower extremity pinkish discoloration.  Neurological:     Mental Status: She is alert and oriented to person, place, and time.      Scheduled Meds:  aspirin  81 mg Oral Daily   atorvastatin  40 mg Oral Daily   brivaracetam  75 mg Oral BID   Chlorhexidine Gluconate Cloth  6 each Topical Q0600   lacosamide  50 mg Oral BID   melatonin  5 mg Oral  QHS   metoprolol tartrate  12.5 mg Oral BID   multivitamin  1 tablet Oral QHS   pantoprazole  40 mg Oral Daily   sodium chloride flush  3 mL Intravenous Q12H   Continuous Infusions:  cefTRIAXone (ROCEPHIN)  IV     Brief history 70 year old female with end-stage renal disease on hemodialysis on Monday Wednesday Friday, CAD, subdural hematoma status postevacuation (07/25/2020), seizure disorder, hypertension, hyperlipidemia, chronic hyponatremia and anemia of chronic disease.  Presented with syncopal episode with dialysis and sent into the hospital for further evaluation and found to have acute cystitis with hematuria.  Assessment/Plan:  Acute cystitis with hematuria with severe leukocytosis.  Patient was on Macrodantin as outpatient likely not the best medication in a dialysis patient.  On Rocephin here.  Follow-up urine culture.  White blood cell count 48 on presentation down to 22.5 today.  Recheck again tomorrow. Syncope with dialysis.  Patient to have dialysis this afternoon will see if it reoccurs. End-stage renal disease on dialysis Monday Wednesday Friday.  Since patient did not have a full session yesterday will be dialyzed today. Chronic systolic congestive heart failure and cardiomyopathy.  Last EF 25 to 30%.  Continue metoprolol.  Dialysis to manage fluid.  Since the patient urinates may be able to do torsemide also  but with dialysis this afternoon I will hold on that order. Seizure disorder continue seizure medications Anemia of chronic disease.  Last hemoglobin 12.4 History of subdural hematoma status post partial craniotomy and evacuation at [facility] in March 2022 Depression on Zoloft Generalized weakness.  PT and OT evaluations Hyponatremia dialysis to handle.     Code Status:     Code Status Orders  (From admission, onward)           Start     Ordered   12/22/20 2059  Do not attempt resuscitation (DNR)  Continuous       Question Answer Comment  In the event of cardiac  or respiratory ARREST Do not call a "code blue"   In the event of cardiac or respiratory ARREST Do not perform Intubation, CPR, defibrillation or ACLS   In the event of cardiac or respiratory ARREST Use medication by any route, position, wound care, and other measures to relive pain and suffering. May use oxygen, suction and manual treatment of airway obstruction as needed for comfort.      12/22/20 2101           Code Status History     Date Active Date Inactive Code Status Order ID Comments User Context   11/07/2020 1614 11/14/2020 0031 Full Code [id number]  Mansy, Arvella Merles, MD ED   08/04/2020 1427 09/15/2020 2055 Full Code [id number]  Bary Leriche, PA-C Inpatient   07/04/2020 1825 07/24/2020 1637 Full Code [id number]  Bary Leriche, PA-C Inpatient   06/19/2020 1956 07/04/2020 1817 Full Code [id number]  Neena Rhymes, MD ED   05/29/2020 1852 05/31/2020 1845 Full Code [id number]  Cox, Amy N, DO ED   04/22/2020 2041 04/28/2020 1911 Full Code [id number]  Lequita Halt, MD ED   12/16/2019 1905 12/17/2019 2107 Full Code [id number]  Ivor Costa, MD ED   04/03/2019 1630 04/06/2019 1819 Full Code [id number]  Damita Lack, MD ED   06/19/2018 1317 06/22/2018 1851 Full Code [id number]  Salary, Avel Peace, MD Inpatient   09/24/2016 0049 09/27/2016 1657 Full Code [id number]  Hugelmeyer, Godfrey, DO Inpatient   09/06/2016 0030 09/09/2016 1624 Full Code [id number]  Lance Coon, MD Inpatient      Family Communication: daughter at bedside Disposition Plan: Status is: Observation  Dispo: The patient is from: [facility] rehab              Anticipated d/c is to: [facility] rehab              Patient currently not medically stable.  Requires dialysis today because she did not have a full session yesterday with the episode during dialysis.   Difficult to place patient.  No.   Consultants: Nephrology  Antibiotics: -Rocephin  Time spent: 28 minutes  [place]

## 2020-12-23 NOTE — Progress Notes (Signed)
[facility]  ROUNDING NOTE   Subjective:   [name] was admitted to [facility] on 12/22/2020 for Syncope [R55]  Last hemodialysis treatment was yesterday. She received 35 minutes of treatment and then had a syncopal episode. She had 491mL of fluid removed.   Patient has significant fluid overload.   Husband at bedside.    Objective:  Vital signs in last 24 hours:  Temp:  [97.9 F (36.6 C)-98.4 F (36.9 C)] 98.4 F (36.9 C) (08/22 1711) Pulse Rate:  [72-88] 81 (08/23 0700) Resp:  [15-25] 19 (08/23 0700) BP: (135-176)/(73-99) 156/88 (08/23 0700) SpO2:  [92 %-100 %] 98 % (08/23 0700)  Weight change:  There were no vitals filed for this visit.  Intake/Output: No intake/output data recorded.   Intake/Output this shift:  No intake/output data recorded.  Physical Exam: General: NAD,   Head: Normocephalic, atraumatic. Moist oral mucosal membranes  Eyes: Anicteric, PERRL  Neck: Supple, trachea midline  Lungs:  Clear to auscultation  Heart: Regular rate and rhythm  Abdomen:  Soft, nontender,   Extremities:  + peripheral edema.  Neurologic: Nonfocal, moving all four extremities  Skin: No lesions  Access: RIJ permcath    Basic Metabolic Panel: Recent Labs  Lab 12/22/20 1223 12/23/20 0615  NA 130* 131*  K 3.0* 4.1  CL 96* 96*  CO2 23 24  GLUCOSE 89 76  BUN 14 17  CREATININE 2.79* 3.17*  CALCIUM 8.6* 8.5*  MG 1.7  --     Liver Function Tests: Recent Labs  Lab 12/22/20 1223  AST 23  ALT 15  ALKPHOS 176*  BILITOT 0.7  PROT 4.9*  ALBUMIN 2.1*   No results for input(s): LIPASE, AMYLASE in the last 168 hours. No results for input(s): AMMONIA in the last 168 hours.  CBC: Recent Labs  Lab 12/22/20 1223 12/23/20 0615  WBC 47.8*  48.0* 22.5*  NEUTROABS 44.4*  --   HGB 13.1  13.1 12.4  HCT 42.3  41.9 40.6  MCV 89.8  88.8 88.5  PLT 292  287 259    Cardiac Enzymes: No results for input(s): CKTOTAL, CKMB, CKMBINDEX, TROPONINI in  the last 168 hours.  BNP: Invalid input(s): POCBNP  CBG: No results for input(s): GLUCAP in the last 168 hours.  Microbiology: Results for orders placed or performed during the hospital encounter of 12/22/20  Blood culture (single)     Status: None (Preliminary result)   Collection Time: 12/22/20  8:55 PM   Specimen: BLOOD  Result Value Ref Range Status   Specimen Description BLOOD BLOOD RIGHT WRIST  Final   Special Requests   Final    BOTTLES DRAWN AEROBIC AND ANAEROBIC Blood Culture results may not be optimal due to an inadequate volume of blood received in culture bottles   Culture   Final    NO GROWTH < 12 HOURS Performed at [facility], [address] [place], [place] [address]    Report Status PENDING  Incomplete    Coagulation Studies: No results for input(s): LABPROT, INR in the last 72 hours.  Urinalysis: Recent Labs    12/22/20 1950  COLORURINE YELLOW*  LABSPEC 1.017  PHURINE 5.0  GLUCOSEU NEGATIVE  HGBUR SMALL*  BILIRUBINUR NEGATIVE  KETONESUR NEGATIVE  PROTEINUR >=300*  NITRITE NEGATIVE  LEUKOCYTESUR MODERATE*      Imaging: CT ABDOMEN PELVIS WO CONTRAST  Result Date: 12/22/2020 CLINICAL DATA:  Acute nonlocalized abdominal pain. EXAM: CT ABDOMEN AND PELVIS WITHOUT CONTRAST TECHNIQUE: Multidetector CT imaging  of the abdomen and pelvis was performed following the standard protocol without IV contrast. COMPARISON:  09/27/2014 FINDINGS: Lower chest: Moderate to large bilateral pleural effusions with basilar atelectasis. Hepatobiliary: No focal liver abnormality is seen. Status post cholecystectomy. No biliary dilatation. Pancreas: Unremarkable. No pancreatic ductal dilatation or surrounding inflammatory changes. Spleen: Normal in size without focal abnormality. Adrenals/Urinary Tract: No adrenal gland nodules. Increased density in the kidneys could represent intrarenal stones or more likely residual contrast material from prior contrast  administration. No hydronephrosis or hydroureter. Bladder is decompressed. There appears to be a stone in the dependent bladder on the right. Stomach/Bowel: Stomach, small bowel, and colon are mostly decompressed. Scattered colonic diverticula without evidence of diverticulitis. Appendix is not identified. Vascular/Lymphatic: Scattered aortic calcification. No aneurysm. Scattered retroperitoneal lymph nodes without pathologic enlargement. Reproductive: Status post hysterectomy. No adnexal masses. Other: Moderate free fluid in the abdomen and pelvis, likely ascites. Diffuse edema throughout the subcutaneous fatty tissues of the abdomen, pelvis, and visualized lower chest. No free air. Musculoskeletal: Previous internal fixation of the right hip. Degenerative changes in the spine. No destructive bone lesions. IMPRESSION: 1. Moderate bilateral pleural effusions with basilar atelectasis. 2. Moderate abdominal and pelvic ascites. 3. Diffuse soft tissue edema. 4. Increased density in the kidneys may represent multiple intrarenal stones or residual contrast material. Suggestion of a stone in the bladder. 5. Colonic diverticula without evidence of diverticulitis. 6. Aortic atherosclerosis. Electronically Signed   By: Lucienne Capers M.D.   On: 12/22/2020 18:04   DG Chest 2 View  Result Date: 12/22/2020 CLINICAL DATA:  Syncopal episode. EXAM: CHEST - 2 VIEW COMPARISON:  11/07/2020 FINDINGS: Right central venous catheter with tip over the low SVC region. Shallow inspiration. Heart size and pulmonary vascularity are normal for technique. There are bilateral pleural effusions, greater on the left. No perihilar edema or infiltration. Atelectasis in the lung bases. Mediastinal contours appear intact. IMPRESSION: Bilateral pleural effusions with basilar atelectasis, greater on the left. Electronically Signed   By: Lucienne Capers M.D.   On: 12/22/2020 18:17   CT HEAD WO CONTRAST (5MM)  Result Date: 12/22/2020 CLINICAL  DATA:  Cerebral hemorrhage suspected. Syncopal episode. Witnessed syncopal episode for 45 seconds during dialysis. EXAM: CT HEAD WITHOUT CONTRAST TECHNIQUE: Contiguous axial images were obtained from the base of the skull through the vertex without intravenous contrast. COMPARISON:  07/25/2020 FINDINGS: Brain: Since the previous study there has been interval operative evacuation of the large subdural hematoma previously seen. There is a small chronic subdural fluid collection demonstrated today. Mild thickening of the dura is likely postoperative. No recurrent hemorrhage is identified. Chronic atrophy. Ventricular dilatation consistent with central atrophy. Low-attenuation changes in the deep white matter consistent small vessel ischemia. Vascular: Moderate intracranial vascular calcifications. Skull: Postoperative left frontoparietal craniotomy with plate and screw fixation of the bone flap. Sinuses/Orbits: Paranasal sinuses and mastoid air cells are clear. Other: None. IMPRESSION: 1. Postoperative changes with interval evacuation of previous large subdural hematoma. Postoperative changes and small chronic subdural collection seen today. No evidence of recurrent intracranial hemorrhage. 2. Chronic atrophy and small vessel ischemic changes. Electronically Signed   By: Lucienne Capers M.D.   On: 12/22/2020 17:59     Medications:    cefTRIAXone (ROCEPHIN)  IV      aspirin  81 mg Oral Daily   atorvastatin  40 mg Oral Daily   brivaracetam  75 mg Oral BID   lacosamide  50 mg Oral BID   melatonin  5 mg Oral QHS  metoprolol tartrate  12.5 mg Oral BID   multivitamin  1 tablet Oral QHS   pantoprazole  40 mg Oral Daily   sodium chloride flush  3 mL Intravenous Q12H   acetaminophen **OR** acetaminophen, ondansetron **OR** ondansetron (ZOFRAN) IV, senna-docusate, traZODone  Assessment/ Plan:  Ms. ARDICE BOYAN is a 70 y.o. white female with end stage renal disease on hemodialysis, hypertension,  hyperlipidemia, seizure disorder, GERD, anxiety, SDH status post craniotomy who was admitted to [facility] on 12/22/2020 for Syncope [R55]  [facility] M[facility] Shanon Payor RIJ permcath 08UP  End Stage Renal Disease: did not complete dialysis treatment yesterday.  Hypertension: with syncopal episode. Continue metoprolol  Anemia of chronic kidney disease: hemoglobin 12.4. No indication for ESA   Secondary Hyperparathyroidism: outpatient parameters at goal. Currently not on a binder.   Urinary tract infection: placed on empiric ceftriaxone    LOS: 0 Artie Takayama 8/23/20229:29 AM

## 2020-12-23 NOTE — H&P (View-Only) (Signed)
[facility] Vascular Consult Note  MRN : [id number]  Amy [name] is a 70 y.o. (Feb 09, 1951) female who presents with chief complaint of  Chief Complaint  Patient presents with   Loss of Consciousness   History of Present Illness:  Amy [name] is a 70 y.o. female with medical history significant for ESRD on MWF HD, HFrEF (EF 25%), severe diffuse CAD s/p DES proximal LAD (06/27/2020), SDH s/p partial craniotomy and evacuation (07/25/2020), seizure disorder (on Briviact and Vimpat), HTN, HLD, chronic hyponatremia, anemia of chronic disease, GERD, L4-L5 stenosis, and depression/anxiety who presented to the ED for evaluation after a syncopal episode while at dialysis.  Patient went to her usual dialysis session this morning (12/22/2020).  After about 50 minutes into her dialysis treatment she had lost consciousness/had a syncopal episode.  Patient does not remember what happened.  Dialysis staff reported that patient was feeling nauseous and had some emesis prior to her syncopal episode.  She did not lose pulse or require CPR.  She regained consciousness after about 45 seconds.  She was subsequently sent to the ED for further evaluation.  Patient feels that she is near her baseline status now.  She does recall having some mild nausea over the last 2 days without any emesis during that time.  She has chronic swelling in her legs which is unchanged.  She reports occasional dyspnea with exertion.  She has significant deconditioning/generalized weakness due to her chronic medical issues and hospitalizations over the last year and currently requires a wheelchair for transport.  She does report becoming lightheaded/dizzy when she sits up after laying down for period of time.  She says this resolves after resting for a moment.  She has not had any recent falls.  She denies any chest pain, cough, abdominal pain.   She does report recent dysuria and was started on an antibiotic 2 days ago  for suspected UTI.  She denies any fevers, chills, diaphoresis, diarrhea.  Vascular surgery was consulted by NP breeze for PermCath exchange due to a nonfunctioning PermCath.  Patient underwent dialysis this a.m. The arterial line was able to pull back unable to flush the venous line.  Current Facility-Administered Medications  Medication Dose Route Frequency Provider Last Rate Last Admin   acetaminophen (TYLENOL) tablet 650 mg  650 mg Oral Q6H PRN Lenore Cordia, MD       Or   acetaminophen (TYLENOL) suppository 650 mg  650 mg Rectal Q6H PRN Lenore Cordia, MD       aspirin chewable tablet 81 mg  81 mg Oral Daily Lenore Cordia, MD   81 mg at 12/23/20 1145   atorvastatin (LIPITOR) tablet 40 mg  40 mg Oral Daily Lenore Cordia, MD   40 mg at 12/23/20 1145   brivaracetam (BRIVIACT) tablet 75 mg  75 mg Oral BID Lenore Cordia, MD       calcium carbonate (TUMS - dosed in mg elemental calcium) chewable tablet 750-1,500 mg  750-1,500 mg Oral PRN Wieting, Richard, MD       cefTRIAXone (ROCEPHIN) 1 g in sodium chloride 0.9 % 100 mL IVPB  1 g Intravenous Q24H Lenore Cordia, MD       Chlorhexidine Gluconate Cloth 2 % PADS 6 each  6 each Topical Q0600 Kolluru, Sarath, MD   6 each at 12/23/20 1443   lacosamide (VIMPAT) tablet 50 mg  50 mg Oral BID Lenore Cordia, MD   50 mg  at 12/23/20 1145   melatonin tablet 5 mg  5 mg Oral QHS Zada Finders R, MD   5 mg at 12/23/20 0022   metoprolol tartrate (LOPRESSOR) tablet 12.5 mg  12.5 mg Oral BID Zada Finders R, MD   12.5 mg at 12/23/20 1146   multivitamin (RENA-VIT) tablet 1 tablet  1 tablet Oral QHS Lenore Cordia, MD       nitroGLYCERIN (NITROSTAT) SL tablet 0.4 mg  0.4 mg Sublingual Q5 min PRN Loletha Grayer, MD       ondansetron [facility]) tablet 4 mg  4 mg Oral Q6H PRN Lenore Cordia, MD       Or   ondansetron (ZOFRAN) injection 4 mg  4 mg Intravenous Q6H PRN Lenore Cordia, MD       pantoprazole (PROTONIX) EC tablet 40 mg  40 mg Oral Daily  Zada Finders R, MD   40 mg at 12/23/20 1145   senna-docusate (Senokot-S) tablet 1 tablet  1 tablet Oral QHS PRN Lenore Cordia, MD       sertraline (ZOLOFT) tablet 50 mg  50 mg Oral Daily Wieting, Richard, MD   50 mg at 12/23/20 1443   sodium chloride flush (NS) 0.9 % injection 3 mL  3 mL Intravenous Q12H Zada Finders R, MD   3 mL at 12/23/20 1146   traZODone (DESYREL) tablet 25-50 mg  25-50 mg Oral QHS PRN Lenore Cordia, MD       Past Medical History:  Diagnosis Date   Anemia    a. 08/2016 Guaiac + stool. EGD w/ gastritis.   Anxiety    Closed fracture of shaft of left ulna 11/27/2018   Colon polyp    a. 08/2016 Colonoscopy.   ESRD on peritoneal dialysis ([place])    Gastritis    a. 08/2016 EGD: Gastritis-->PPI.   GERD (gastroesophageal reflux disease)    History of chicken pox    History of measles as a child    Hypertension    Hyponatremia    a. 08/2016 in setting of HCTZ Rx.   Leukocytosis    Mesenteric lymphadenopathy 04/08/2017   Multiple thyroid nodules    Past Surgical History:  Procedure Laterality Date   ABDOMINAL HYSTERECTOMY  2003   due to heavy periods and clotting during menses   BREAST LUMPECTOMY Left 1990   CHOLECYSTECTOMY  2010   COLONOSCOPY WITH PROPOFOL N/A 09/07/2016   Procedure: COLONOSCOPY WITH PROPOFOL;  Surgeon: Lucilla Lame, MD;  Location: [facility] ENDOSCOPY;  Service: Endoscopy;  Laterality: N/A;   CORONARY STENT INTERVENTION N/A 06/27/2020   Procedure: CORONARY STENT INTERVENTION;  Surgeon: Sherren Mocha, MD;  Location: [place] [facility];  Service: Cardiovascular;  Laterality: N/A;   DIALYSIS/PERMA CATHETER INSERTION N/A 11/16/2018   Procedure: DIALYSIS/PERMA CATHETER INSERTION;  Surgeon: Algernon Huxley, MD;  Location: [place] [facility];  Service: Cardiovascular;  Laterality: N/A;   DIALYSIS/PERMA CATHETER INSERTION N/A 11/10/2020   Procedure: DIALYSIS/PERMA CATHETER EXCHANGE;  Surgeon: Algernon Huxley, MD;  Location: [place] [facility];  Service:  Cardiovascular;  Laterality: N/A;   DIALYSIS/PERMA CATHETER REMOVAL N/A 08/16/2019   Procedure: DIALYSIS/PERMA CATHETER REMOVAL;  Surgeon: Algernon Huxley, MD;  Location: [place] [facility];  Service: Cardiovascular;  Laterality: N/A;   DILATION AND CURETTAGE OF UTERUS  1985   ESOPHAGOGASTRODUODENOSCOPY (EGD) WITH PROPOFOL N/A 09/07/2016   Procedure: ESOPHAGOGASTRODUODENOSCOPY (EGD) WITH PROPOFOL;  Surgeon: Lucilla Lame, MD;  Location: [facility] ENDOSCOPY;  Service: Endoscopy;  Laterality: N/A;  GALLBLADDER SURGERY  2012   INTRAMEDULLARY (IM) NAIL INTERTROCHANTERIC Right 04/23/2020   Procedure: INTRAMEDULLARY (IM) NAIL INTERTROCHANTRIC;  Surgeon: Thornton Park, MD;  Location: [facility] OR[facility];  Service: Orthopedics;  Laterality: Right;   INTRAVASCULAR IMAGING/OCT N/A 06/27/2020   Procedure: INTRAVASCULAR IMAGING/OCT;  Surgeon: Sherren Mocha, MD;  Location: [place] [facility];  Service: Cardiovascular;  Laterality: N/A;   IR FLUORO GUIDE CV LINE RIGHT  09/04/2020   IR US GUIDE VASC ACCESS RIGHT  09/04/2020   KNEE SURGERY     Dr. Tamala Julian   LEFT HEART CATH AND CORONARY ANGIOGRAPHY N/A 06/25/2020   Procedure: LEFT HEART CATH AND CORONARY ANGIOGRAPHY;  Surgeon: Troy Sine, MD;  Location: [place] [facility];  Service: Cardiovascular;  Laterality: N/A;   TONSILLECTOMY     TONSILLECTOMY AND ADENOIDECTOMY  1970   UPPER GI ENDOSCOPY  03/02/2006   H. Pylori negative; Dr. Allen Norris   Social History Social History   Tobacco Use   Smoking status: Never   Smokeless tobacco: Never  Vaping Use   Vaping Use: Never used  Substance Use Topics   Alcohol use: Yes    Alcohol/week: 0.0 - 2.0 standard drinks   Drug use: No   Family History Family History  Problem Relation Age of Onset   Pancreatic cancer Father    Congestive Heart Failure Maternal Grandmother    Cancer Paternal Grandfather    Heart disease Maternal Uncle   Denies family history of renal disease, peripheral artery disease or venous  disease.  Allergies  Allergen Reactions   Cephalosporins Hives and Swelling   Clarithromycin Other (See Comments)    Unknown reaction   Ensure [Nutritional Supplements] Nausea And Vomiting   Gabapentin    Lunesta  [Eszopiclone]     Heart racing   Nepro [Compleat] Nausea And Vomiting   Penicillin V     Has patient had a PCN reaction causing immediate rash, facial/tongue/throat swelling, SOB or lightheadedness with hypotension: Yes Has patient had a PCN reaction causing severe rash involving mucus membranes or skin necrosis: No Has patient had a PCN reaction that required hospitalization No Has patient had a PCN reaction occurring within the last 10 years: No If all of the above answers are "NO", then may proceed with Cephalosporin use.   Sulfa Antibiotics     Unknown reaction   REVIEW OF SYSTEMS (Negative unless checked)  Constitutional: [] Weight loss  [] Fever  [] Chills Cardiac: [] Chest pain   [] Chest pressure   [] Palpitations   [] Shortness of breath when laying flat   [] Shortness of breath at rest   [x] Shortness of breath with exertion. Vascular:  [] Pain in legs with walking   [] Pain in legs at rest   [] Pain in legs when laying flat   [] Claudication   [] Pain in feet when walking  [] Pain in feet at rest  [] Pain in feet when laying flat   [] History of DVT   [] Phlebitis   [x] Swelling in legs   [] Varicose veins   [] Non-healing ulcers Pulmonary:   [] Uses home oxygen   [] Productive cough   [] Hemoptysis   [] Wheeze  [] COPD   [] Asthma Neurologic:  [] Dizziness  [] Blackouts   [] Seizures   [] History of stroke   [] History of TIA  [] Aphasia   [] Temporary blindness   [] Dysphagia   [] Weakness or numbness in arms   [] Weakness or numbness in legs Musculoskeletal:  [] Arthritis   [] Joint swelling   [] Joint pain   [] Low back pain Hematologic:  [] Easy bruising  [] Easy bleeding   []   Hypercoagulable state   [] Anemic  [] Hepatitis Gastrointestinal:  [] Blood in stool   [] Vomiting blood  [] Gastroesophageal  reflux/heartburn   [] Difficulty swallowing. Genitourinary:  [x] Chronic kidney disease   [] Difficult urination  [] Frequent urination  [] Burning with urination   [] Blood in urine Skin:  [] Rashes   [] Ulcers   [] Wounds Psychological:  [] History of anxiety   []  History of major depression.  Physical Examination  Vitals:   12/23/20 0600 12/23/20 0700 12/23/20 1230 12/23/20 1455  BP: (!) 154/83 (!) 156/88  (!) 177/82  Pulse: 80 81 72 77  Resp: 16 19 (!) 22 18  Temp:    98.6 F (37 C)  TempSrc:    Oral  SpO2: 96% 98% 97% 97%  Weight:    52.5 kg  Height:    5\' 4"  (1.626 m)   Body mass index is 19.87 kg/m. Gen:  WD/WN, NAD Head: [place]/AT, No temporalis wasting. Prominent temp pulse not noted. Ear/Nose/Throat: Hearing grossly intact, nares w/o erythema or drainage, oropharynx w/o Erythema/Exudate Eyes: Sclera non-icteric, conjunctiva clear Neck: Trachea midline.  No JVD.  Pulmonary:  Good air movement, respirations not labored, equal bilaterally.  Cardiac: RRR, normal S1, S2. Vascular:  Vessel Right Left  Radial Palpable Palpable  Ulnar Palpable Palpable                               Gastrointestinal: soft, non-tender/non-distended. No guarding/reflex.  Musculoskeletal: M/S 5/5 throughout.  Extremities without ischemic changes.  No deformity or atrophy. No edema. Neurologic: Sensation grossly intact in extremities.  Symmetrical.  Speech is fluent. Motor exam as listed above. Psychiatric: Judgment intact, Mood & affect appropriate for pt's clinical situation. Dermatologic: No rashes or ulcers noted.  No cellulitis or open wounds. Lymph : No Cervical, Axillary, or Inguinal lymphadenopathy.  CBC Lab Results  Component Value Date   WBC 22.5 (H) 12/23/2020   HGB 12.4 12/23/2020   HCT 40.6 12/23/2020   MCV 88.5 12/23/2020   PLT 259 12/23/2020   BMET    Component Value Date/Time   NA 131 (L) 12/23/2020 0615   NA 135 11/08/2018 1620   K 4.1 12/23/2020 0615   CL 96 (L)  12/23/2020 0615   CO2 24 12/23/2020 0615   GLUCOSE 76 12/23/2020 0615   BUN 17 12/23/2020 0615   BUN 77 (HH) 11/08/2018 1620   CREATININE 3.17 (H) 12/23/2020 0615   CREATININE 1.93 (H) 04/05/2017 1451   CALCIUM 8.5 (L) 12/23/2020 0615   CALCIUM 8.2 (L) 08/13/2020 0739   GFRNONAA 15 (L) 12/23/2020 0615   GFRAA 7 (L) 12/17/2019 0444   Estimated Creatinine Clearance: 13.3 mL/min (A) (by C-G formula based on SCr of 3.17 mg/dL (H)).  COAG Lab Results  Component Value Date   INR 1.1 09/02/2020   INR 1.1 06/19/2020   INR 1.2 04/04/2019   Radiology CT ABDOMEN PELVIS WO CONTRAST  Result Date: 12/22/2020 CLINICAL DATA:  Acute nonlocalized abdominal pain. EXAM: CT ABDOMEN AND PELVIS WITHOUT CONTRAST TECHNIQUE: Multidetector CT imaging of the abdomen and pelvis was performed following the standard protocol without IV contrast. COMPARISON:  09/27/2014 FINDINGS: Lower chest: Moderate to large bilateral pleural effusions with basilar atelectasis. Hepatobiliary: No focal liver abnormality is seen. Status post cholecystectomy. No biliary dilatation. Pancreas: Unremarkable. No pancreatic ductal dilatation or surrounding inflammatory changes. Spleen: Normal in size without focal abnormality. Adrenals/Urinary Tract: No adrenal gland nodules. Increased density in the kidneys could represent intrarenal stones or more likely  residual contrast material from prior contrast administration. No hydronephrosis or hydroureter. Bladder is decompressed. There appears to be a stone in the dependent bladder on the right. Stomach/Bowel: Stomach, small bowel, and colon are mostly decompressed. Scattered colonic diverticula without evidence of diverticulitis. Appendix is not identified. Vascular/Lymphatic: Scattered aortic calcification. No aneurysm. Scattered retroperitoneal lymph nodes without pathologic enlargement. Reproductive: Status post hysterectomy. No adnexal masses. Other: Moderate free fluid in the abdomen and  pelvis, likely ascites. Diffuse edema throughout the subcutaneous fatty tissues of the abdomen, pelvis, and visualized lower chest. No free air. Musculoskeletal: Previous internal fixation of the right hip. Degenerative changes in the spine. No destructive bone lesions. IMPRESSION: 1. Moderate bilateral pleural effusions with basilar atelectasis. 2. Moderate abdominal and pelvic ascites. 3. Diffuse soft tissue edema. 4. Increased density in the kidneys may represent multiple intrarenal stones or residual contrast material. Suggestion of a stone in the bladder. 5. Colonic diverticula without evidence of diverticulitis. 6. Aortic atherosclerosis. Electronically Signed   By: Lucienne Capers M.D.   On: 12/22/2020 18:04   DG Chest 2 View  Result Date: 12/22/2020 CLINICAL DATA:  Syncopal episode. EXAM: CHEST - 2 VIEW COMPARISON:  11/07/2020 FINDINGS: Right central venous catheter with tip over the low SVC region. Shallow inspiration. Heart size and pulmonary vascularity are normal for technique. There are bilateral pleural effusions, greater on the left. No perihilar edema or infiltration. Atelectasis in the lung bases. Mediastinal contours appear intact. IMPRESSION: Bilateral pleural effusions with basilar atelectasis, greater on the left. Electronically Signed   By: Lucienne Capers M.D.   On: 12/22/2020 18:17   CT HEAD WO CONTRAST (5MM)  Result Date: 12/22/2020 CLINICAL DATA:  Cerebral hemorrhage suspected. Syncopal episode. Witnessed syncopal episode for 45 seconds during dialysis. EXAM: CT HEAD WITHOUT CONTRAST TECHNIQUE: Contiguous axial images were obtained from the base of the skull through the vertex without intravenous contrast. COMPARISON:  07/25/2020 FINDINGS: Brain: Since the previous study there has been interval operative evacuation of the large subdural hematoma previously seen. There is a small chronic subdural fluid collection demonstrated today. Mild thickening of the dura is likely  postoperative. No recurrent hemorrhage is identified. Chronic atrophy. Ventricular dilatation consistent with central atrophy. Low-attenuation changes in the deep white matter consistent small vessel ischemia. Vascular: Moderate intracranial vascular calcifications. Skull: Postoperative left frontoparietal craniotomy with plate and screw fixation of the bone flap. Sinuses/Orbits: Paranasal sinuses and mastoid air cells are clear. Other: None. IMPRESSION: 1. Postoperative changes with interval evacuation of previous large subdural hematoma. Postoperative changes and small chronic subdural collection seen today. No evidence of recurrent intracranial hemorrhage. 2. Chronic atrophy and small vessel ischemic changes. Electronically Signed   By: Lucienne Capers M.D.   On: 12/22/2020 17:59    Assessment/Plan The patient is a 70 year old female with multiple medical issues including known end-stage renal disease currently maintained on hemodialysis who presents with a nonfunctioning PermCath.  1.  Nonfunctioning PermCath: Patient has a known history of end-stage renal disease currently maintained on hemodialysis.  The patient went to dialysis this a.m. and was unable to dialyze.  The patient's arterial line was able to flush however the patient's venous line did not pull back.  The patient is currently unable to adequately dialyze and therefore she will need a new access.  We will plan on PermCath exchange tomorrow with Dr. Lucky Cowboy.  Procedure, risks and benefits were signed to the patient.  All questions were answered.  Patient was to proceed.  2.  End-stage renal disease:  The patient has a history of known end-stage renal disease currently maintained on hemodialysis.  Currently the patient's PermCath is not functioning and therefore she does not have an adequate dialysis access. We will plan on PermCath exchange tomorrow with Dr. Lucky Cowboy Nephrology following  3.  Hyperlipidemia: On Lipitor for medical  management Encouraged good control as its slows the progression of atherosclerotic disease   Discussed with Dr. Mayme Genta, PA-C 12/23/2020 3:46 PM  This note was created with Dragon medical transcription system.  Any error is purely unintentional.

## 2020-12-23 NOTE — Consult Note (Signed)
[facility] Vascular Consult Note  MRN : [id number]  Amy [name] is a 70 y.o. (06-Aug-1950) female who presents with chief complaint of  Chief Complaint  Patient presents with   Loss of Consciousness   History of Present Illness:  Amy [name] is a 70 y.o. female with medical history significant for ESRD on MWF HD, HFrEF (EF 25%), severe diffuse CAD s/p DES proximal LAD (06/27/2020), SDH s/p partial craniotomy and evacuation (07/25/2020), seizure disorder (on Briviact and Vimpat), HTN, HLD, chronic hyponatremia, anemia of chronic disease, GERD, L4-L5 stenosis, and depression/anxiety who presented to the ED for evaluation after a syncopal episode while at dialysis.  Patient went to her usual dialysis session this morning (12/22/2020).  After about 50 minutes into her dialysis treatment she had lost consciousness/had a syncopal episode.  Patient does not remember what happened.  Dialysis staff reported that patient was feeling nauseous and had some emesis prior to her syncopal episode.  She did not lose pulse or require CPR.  She regained consciousness after about 45 seconds.  She was subsequently sent to the ED for further evaluation.  Patient feels that she is near her baseline status now.  She does recall having some mild nausea over the last 2 days without any emesis during that time.  She has chronic swelling in her legs which is unchanged.  She reports occasional dyspnea with exertion.  She has significant deconditioning/generalized weakness due to her chronic medical issues and hospitalizations over the last year and currently requires a wheelchair for transport.  She does report becoming lightheaded/dizzy when she sits up after laying down for period of time.  She says this resolves after resting for a moment.  She has not had any recent falls.  She denies any chest pain, cough, abdominal pain.   She does report recent dysuria and was started on an antibiotic 2 days ago  for suspected UTI.  She denies any fevers, chills, diaphoresis, diarrhea.  Vascular surgery was consulted by NP breeze for PermCath exchange due to a nonfunctioning PermCath.  Patient underwent dialysis this a.m. The arterial line was able to pull back unable to flush the venous line.  Current Facility-Administered Medications  Medication Dose Route Frequency Provider Last Rate Last Admin   acetaminophen (TYLENOL) tablet 650 mg  650 mg Oral Q6H PRN Lenore Cordia, MD       Or   acetaminophen (TYLENOL) suppository 650 mg  650 mg Rectal Q6H PRN Lenore Cordia, MD       aspirin chewable tablet 81 mg  81 mg Oral Daily Lenore Cordia, MD   81 mg at 12/23/20 1145   atorvastatin (LIPITOR) tablet 40 mg  40 mg Oral Daily Lenore Cordia, MD   40 mg at 12/23/20 1145   brivaracetam (BRIVIACT) tablet 75 mg  75 mg Oral BID Lenore Cordia, MD       calcium carbonate (TUMS - dosed in mg elemental calcium) chewable tablet 750-1,500 mg  750-1,500 mg Oral PRN Wieting, Richard, MD       cefTRIAXone (ROCEPHIN) 1 g in sodium chloride 0.9 % 100 mL IVPB  1 g Intravenous Q24H Lenore Cordia, MD       Chlorhexidine Gluconate Cloth 2 % PADS 6 each  6 each Topical Q0600 Kolluru, Sarath, MD   6 each at 12/23/20 1443   lacosamide (VIMPAT) tablet 50 mg  50 mg Oral BID Lenore Cordia, MD   50 mg  at 12/23/20 1145   melatonin tablet 5 mg  5 mg Oral QHS Zada Finders R, MD   5 mg at 12/23/20 0022   metoprolol tartrate (LOPRESSOR) tablet 12.5 mg  12.5 mg Oral BID Zada Finders R, MD   12.5 mg at 12/23/20 1146   multivitamin (RENA-VIT) tablet 1 tablet  1 tablet Oral QHS Lenore Cordia, MD       nitroGLYCERIN (NITROSTAT) SL tablet 0.4 mg  0.4 mg Sublingual Q5 min PRN Loletha Grayer, MD       ondansetron [facility]) tablet 4 mg  4 mg Oral Q6H PRN Lenore Cordia, MD       Or   ondansetron (ZOFRAN) injection 4 mg  4 mg Intravenous Q6H PRN Lenore Cordia, MD       pantoprazole (PROTONIX) EC tablet 40 mg  40 mg Oral Daily  Zada Finders R, MD   40 mg at 12/23/20 1145   senna-docusate (Senokot-S) tablet 1 tablet  1 tablet Oral QHS PRN Lenore Cordia, MD       sertraline (ZOLOFT) tablet 50 mg  50 mg Oral Daily Wieting, Richard, MD   50 mg at 12/23/20 1443   sodium chloride flush (NS) 0.9 % injection 3 mL  3 mL Intravenous Q12H Zada Finders R, MD   3 mL at 12/23/20 1146   traZODone (DESYREL) tablet 25-50 mg  25-50 mg Oral QHS PRN Lenore Cordia, MD       Past Medical History:  Diagnosis Date   Anemia    a. 08/2016 Guaiac + stool. EGD w/ gastritis.   Anxiety    Closed fracture of shaft of left ulna 11/27/2018   Colon polyp    a. 08/2016 Colonoscopy.   ESRD on peritoneal dialysis ([place])    Gastritis    a. 08/2016 EGD: Gastritis-->PPI.   GERD (gastroesophageal reflux disease)    History of chicken pox    History of measles as a child    Hypertension    Hyponatremia    a. 08/2016 in setting of HCTZ Rx.   Leukocytosis    Mesenteric lymphadenopathy 04/08/2017   Multiple thyroid nodules    Past Surgical History:  Procedure Laterality Date   ABDOMINAL HYSTERECTOMY  2003   due to heavy periods and clotting during menses   BREAST LUMPECTOMY Left 1990   CHOLECYSTECTOMY  2010   COLONOSCOPY WITH PROPOFOL N/A 09/07/2016   Procedure: COLONOSCOPY WITH PROPOFOL;  Surgeon: Lucilla Lame, MD;  Location: [facility] ENDOSCOPY;  Service: Endoscopy;  Laterality: N/A;   CORONARY STENT INTERVENTION N/A 06/27/2020   Procedure: CORONARY STENT INTERVENTION;  Surgeon: Sherren Mocha, MD;  Location: [place] [facility];  Service: Cardiovascular;  Laterality: N/A;   DIALYSIS/PERMA CATHETER INSERTION N/A 11/16/2018   Procedure: DIALYSIS/PERMA CATHETER INSERTION;  Surgeon: Algernon Huxley, MD;  Location: [place] [facility];  Service: Cardiovascular;  Laterality: N/A;   DIALYSIS/PERMA CATHETER INSERTION N/A 11/10/2020   Procedure: DIALYSIS/PERMA CATHETER EXCHANGE;  Surgeon: Algernon Huxley, MD;  Location: [place] [facility];  Service:  Cardiovascular;  Laterality: N/A;   DIALYSIS/PERMA CATHETER REMOVAL N/A 08/16/2019   Procedure: DIALYSIS/PERMA CATHETER REMOVAL;  Surgeon: Algernon Huxley, MD;  Location: [place] [facility];  Service: Cardiovascular;  Laterality: N/A;   DILATION AND CURETTAGE OF UTERUS  1985   ESOPHAGOGASTRODUODENOSCOPY (EGD) WITH PROPOFOL N/A 09/07/2016   Procedure: ESOPHAGOGASTRODUODENOSCOPY (EGD) WITH PROPOFOL;  Surgeon: Lucilla Lame, MD;  Location: [facility] ENDOSCOPY;  Service: Endoscopy;  Laterality: N/A;  GALLBLADDER SURGERY  2012   INTRAMEDULLARY (IM) NAIL INTERTROCHANTERIC Right 04/23/2020   Procedure: INTRAMEDULLARY (IM) NAIL INTERTROCHANTRIC;  Surgeon: Thornton Park, MD;  Location: [facility] ORS;  Service: Orthopedics;  Laterality: Right;   INTRAVASCULAR IMAGING/OCT N/A 06/27/2020   Procedure: INTRAVASCULAR IMAGING/OCT;  Surgeon: Sherren Mocha, MD;  Location: [place] [facility];  Service: Cardiovascular;  Laterality: N/A;   IR FLUORO GUIDE CV LINE RIGHT  09/04/2020   IR US GUIDE VASC ACCESS RIGHT  09/04/2020   KNEE SURGERY     Dr. Tamala Julian   LEFT HEART CATH AND CORONARY ANGIOGRAPHY N/A 06/25/2020   Procedure: LEFT HEART CATH AND CORONARY ANGIOGRAPHY;  Surgeon: Troy Sine, MD;  Location: [place] [facility];  Service: Cardiovascular;  Laterality: N/A;   TONSILLECTOMY     TONSILLECTOMY AND ADENOIDECTOMY  1970   UPPER GI ENDOSCOPY  03/02/2006   H. Pylori negative; Dr. Allen Norris   Social History Social History   Tobacco Use   Smoking status: Never   Smokeless tobacco: Never  Vaping Use   Vaping Use: Never used  Substance Use Topics   Alcohol use: Yes    Alcohol/week: 0.0 - 2.0 standard drinks   Drug use: No   Family History Family History  Problem Relation Age of Onset   Pancreatic cancer Father    Congestive Heart Failure Maternal Grandmother    Cancer Paternal Grandfather    Heart disease Maternal Uncle   Denies family history of renal disease, peripheral artery disease or venous  disease.  Allergies  Allergen Reactions   Cephalosporins Hives and Swelling   Clarithromycin Other (See Comments)    Unknown reaction   Ensure [Nutritional Supplements] Nausea And Vomiting   Gabapentin    Lunesta  [Eszopiclone]     Heart racing   Nepro [Compleat] Nausea And Vomiting   Penicillin V     Has patient had a PCN reaction causing immediate rash, facial/tongue/throat swelling, SOB or lightheadedness with hypotension: Yes Has patient had a PCN reaction causing severe rash involving mucus membranes or skin necrosis: No Has patient had a PCN reaction that required hospitalization No Has patient had a PCN reaction occurring within the last 10 years: No If all of the above answers are "NO", then may proceed with Cephalosporin use.   Sulfa Antibiotics     Unknown reaction   REVIEW OF SYSTEMS (Negative unless checked)  Constitutional: [] Weight loss  [] Fever  [] Chills Cardiac: [] Chest pain   [] Chest pressure   [] Palpitations   [] Shortness of breath when laying flat   [] Shortness of breath at rest   [x] Shortness of breath with exertion. Vascular:  [] Pain in legs with walking   [] Pain in legs at rest   [] Pain in legs when laying flat   [] Claudication   [] Pain in feet when walking  [] Pain in feet at rest  [] Pain in feet when laying flat   [] History of DVT   [] Phlebitis   [x] Swelling in legs   [] Varicose veins   [] Non-healing ulcers Pulmonary:   [] Uses home oxygen   [] Productive cough   [] Hemoptysis   [] Wheeze  [] COPD   [] Asthma Neurologic:  [] Dizziness  [] Blackouts   [] Seizures   [] History of stroke   [] History of TIA  [] Aphasia   [] Temporary blindness   [] Dysphagia   [] Weakness or numbness in arms   [] Weakness or numbness in legs Musculoskeletal:  [] Arthritis   [] Joint swelling   [] Joint pain   [] Low back pain Hematologic:  [] Easy bruising  [] Easy bleeding   []   Hypercoagulable state   [] Anemic  [] Hepatitis Gastrointestinal:  [] Blood in stool   [] Vomiting blood  [] Gastroesophageal  reflux/heartburn   [] Difficulty swallowing. Genitourinary:  [x] Chronic kidney disease   [] Difficult urination  [] Frequent urination  [] Burning with urination   [] Blood in urine Skin:  [] Rashes   [] Ulcers   [] Wounds Psychological:  [] History of anxiety   []  History of major depression.  Physical Examination  Vitals:   12/23/20 0600 12/23/20 0700 12/23/20 1230 12/23/20 1455  BP: (!) 154/83 (!) 156/88  (!) 177/82  Pulse: 80 81 72 77  Resp: 16 19 (!) 22 18  Temp:    98.6 F (37 C)  TempSrc:    Oral  SpO2: 96% 98% 97% 97%  Weight:    52.5 kg  Height:    5\' 4"  (1.626 m)   Body mass index is 19.87 kg/m. Gen:  WD/WN, NAD Head: [place]/AT, No temporalis wasting. Prominent temp pulse not noted. Ear/Nose/Throat: Hearing grossly intact, nares w/o erythema or drainage, oropharynx w/o Erythema/Exudate Eyes: Sclera non-icteric, conjunctiva clear Neck: Trachea midline.  No JVD.  Pulmonary:  Good air movement, respirations not labored, equal bilaterally.  Cardiac: RRR, normal S1, S2. Vascular:  Vessel Right Left  Radial Palpable Palpable  Ulnar Palpable Palpable                               Gastrointestinal: soft, non-tender/non-distended. No guarding/reflex.  Musculoskeletal: M/S 5/5 throughout.  Extremities without ischemic changes.  No deformity or atrophy. No edema. Neurologic: Sensation grossly intact in extremities.  Symmetrical.  Speech is fluent. Motor exam as listed above. Psychiatric: Judgment intact, Mood & affect appropriate for pt's clinical situation. Dermatologic: No rashes or ulcers noted.  No cellulitis or open wounds. Lymph : No Cervical, Axillary, or Inguinal lymphadenopathy.  CBC Lab Results  Component Value Date   WBC 22.5 (H) 12/23/2020   HGB 12.4 12/23/2020   HCT 40.6 12/23/2020   MCV 88.5 12/23/2020   PLT 259 12/23/2020   BMET    Component Value Date/Time   NA 131 (L) 12/23/2020 0615   NA 135 11/08/2018 1620   K 4.1 12/23/2020 0615   CL 96 (L)  12/23/2020 0615   CO2 24 12/23/2020 0615   GLUCOSE 76 12/23/2020 0615   BUN 17 12/23/2020 0615   BUN 77 (HH) 11/08/2018 1620   CREATININE 3.17 (H) 12/23/2020 0615   CREATININE 1.93 (H) 04/05/2017 1451   CALCIUM 8.5 (L) 12/23/2020 0615   CALCIUM 8.2 (L) 08/13/2020 0739   GFRNONAA 15 (L) 12/23/2020 0615   GFRAA 7 (L) 12/17/2019 0444   Estimated Creatinine Clearance: 13.3 mL/min (A) (by C-G formula based on SCr of 3.17 mg/dL (H)).  COAG Lab Results  Component Value Date   INR 1.1 09/02/2020   INR 1.1 06/19/2020   INR 1.2 04/04/2019   Radiology CT ABDOMEN PELVIS WO CONTRAST  Result Date: 12/22/2020 CLINICAL DATA:  Acute nonlocalized abdominal pain. EXAM: CT ABDOMEN AND PELVIS WITHOUT CONTRAST TECHNIQUE: Multidetector CT imaging of the abdomen and pelvis was performed following the standard protocol without IV contrast. COMPARISON:  09/27/2014 FINDINGS: Lower chest: Moderate to large bilateral pleural effusions with basilar atelectasis. Hepatobiliary: No focal liver abnormality is seen. Status post cholecystectomy. No biliary dilatation. Pancreas: Unremarkable. No pancreatic ductal dilatation or surrounding inflammatory changes. Spleen: Normal in size without focal abnormality. Adrenals/Urinary Tract: No adrenal gland nodules. Increased density in the kidneys could represent intrarenal stones or more likely  residual contrast material from prior contrast administration. No hydronephrosis or hydroureter. Bladder is decompressed. There appears to be a stone in the dependent bladder on the right. Stomach/Bowel: Stomach, small bowel, and colon are mostly decompressed. Scattered colonic diverticula without evidence of diverticulitis. Appendix is not identified. Vascular/Lymphatic: Scattered aortic calcification. No aneurysm. Scattered retroperitoneal lymph nodes without pathologic enlargement. Reproductive: Status post hysterectomy. No adnexal masses. Other: Moderate free fluid in the abdomen and  pelvis, likely ascites. Diffuse edema throughout the subcutaneous fatty tissues of the abdomen, pelvis, and visualized lower chest. No free air. Musculoskeletal: Previous internal fixation of the right hip. Degenerative changes in the spine. No destructive bone lesions. IMPRESSION: 1. Moderate bilateral pleural effusions with basilar atelectasis. 2. Moderate abdominal and pelvic ascites. 3. Diffuse soft tissue edema. 4. Increased density in the kidneys may represent multiple intrarenal stones or residual contrast material. Suggestion of a stone in the bladder. 5. Colonic diverticula without evidence of diverticulitis. 6. Aortic atherosclerosis. Electronically Signed   By: Lucienne Capers M.D.   On: 12/22/2020 18:04   DG Chest 2 View  Result Date: 12/22/2020 CLINICAL DATA:  Syncopal episode. EXAM: CHEST - 2 VIEW COMPARISON:  11/07/2020 FINDINGS: Right central venous catheter with tip over the low SVC region. Shallow inspiration. Heart size and pulmonary vascularity are normal for technique. There are bilateral pleural effusions, greater on the left. No perihilar edema or infiltration. Atelectasis in the lung bases. Mediastinal contours appear intact. IMPRESSION: Bilateral pleural effusions with basilar atelectasis, greater on the left. Electronically Signed   By: Lucienne Capers M.D.   On: 12/22/2020 18:17   CT HEAD WO CONTRAST (5MM)  Result Date: 12/22/2020 CLINICAL DATA:  Cerebral hemorrhage suspected. Syncopal episode. Witnessed syncopal episode for 45 seconds during dialysis. EXAM: CT HEAD WITHOUT CONTRAST TECHNIQUE: Contiguous axial images were obtained from the base of the skull through the vertex without intravenous contrast. COMPARISON:  07/25/2020 FINDINGS: Brain: Since the previous study there has been interval operative evacuation of the large subdural hematoma previously seen. There is a small chronic subdural fluid collection demonstrated today. Mild thickening of the dura is likely  postoperative. No recurrent hemorrhage is identified. Chronic atrophy. Ventricular dilatation consistent with central atrophy. Low-attenuation changes in the deep white matter consistent small vessel ischemia. Vascular: Moderate intracranial vascular calcifications. Skull: Postoperative left frontoparietal craniotomy with plate and screw fixation of the bone flap. Sinuses/Orbits: Paranasal sinuses and mastoid air cells are clear. Other: None. IMPRESSION: 1. Postoperative changes with interval evacuation of previous large subdural hematoma. Postoperative changes and small chronic subdural collection seen today. No evidence of recurrent intracranial hemorrhage. 2. Chronic atrophy and small vessel ischemic changes. Electronically Signed   By: Lucienne Capers M.D.   On: 12/22/2020 17:59    Assessment/Plan The patient is a 70 year old female with multiple medical issues including known end-stage renal disease currently maintained on hemodialysis who presents with a nonfunctioning PermCath.  1.  Nonfunctioning PermCath: Patient has a known history of end-stage renal disease currently maintained on hemodialysis.  The patient went to dialysis this a.m. and was unable to dialyze.  The patient's arterial line was able to flush however the patient's venous line did not pull back.  The patient is currently unable to adequately dialyze and therefore she will need a new access.  We will plan on PermCath exchange tomorrow with Dr. Lucky Cowboy.  Procedure, risks and benefits were signed to the patient.  All questions were answered.  Patient was to proceed.  2.  End-stage renal disease:  The patient has a history of known end-stage renal disease currently maintained on hemodialysis.  Currently the patient's PermCath is not functioning and therefore she does not have an adequate dialysis access. We will plan on PermCath exchange tomorrow with Dr. Lucky Cowboy Nephrology following  3.  Hyperlipidemia: On Lipitor for medical  management Encouraged good control as its slows the progression of atherosclerotic disease   Discussed with Dr. Mayme Genta, PA-C 12/23/2020 3:46 PM  This note was created with Dragon medical transcription system.  Any error is purely unintentional.

## 2020-12-23 NOTE — Evaluation (Signed)
Physical Therapy Evaluation Patient Details Name: Amy [name] MRN: [id number] DOB: 1950-06-07 Today's Date: 12/23/2020   History of Present Illness  70 y.o. female presenting to hospital after syncopal episode while at dialysis, here last month d/t concerns her R chest Port-A-Cath not functioning (B UE/LE edema) and acute on chronic systolic CHF with fluid overload, pulmonary edema, and anasarca. B/L LE pitting edema currently as well.  Permcath exchange 7/11.  PMH includes ESRD on HD MWF, chronic hyponatremia, chronic HA's, multiple admissions (most recent C-diff complicated by NSTEMI s/p PCI with DAPT), recent acute on chronic SDH s/p partial craniotomy for evacuation of SDH, anemia, L ulna shaft fx, gastroparesis, orthostasis, CHF, seizures, R hip fx, R IMN 04/2020, and knee surgery.  Clinical Impression  Pt with tortuous year with carniotomy, hip fx, issues with dialysis port, prolonged rehab stay with minimal functional activity.  She showed some willingness to participate with simple supine exercises but we had to defer mobility 2/2 having a loose stool that needed cleaning up.  Spent a lot of time talking with pt and family about expectations going forward as well as trouble shooting options for what would be needed to be able to return home, much less back to walking.  Apparently she has had some issues with consistent motivation at rehab, she assures me she is willing to start working harder knowing it is her only way to get back to walking/be safe at home.  Follow Up Recommendations Supervision/Assistance - 24 hour;SNF    Equipment Recommendations  None recommended by PT (per progress may need lift)    Recommendations for Other Services       Precautions / Restrictions Precautions Precautions: Fall Restrictions Weight Bearing Restrictions: No      Mobility  Bed Mobility Overal bed mobility: Needs Assistance             General bed mobility comments: deferred bed mobility  2/2 pt having loose stool BM and needing cleaned up    Transfers                 General transfer comment: apparently pt has needed heavy assist with all mobility/transfers.  Has been +2 max assist for standing/pivots  Ambulation/Gait                Stairs            Wheelchair Mobility    Modified Rankin (Stroke Patients Only)       Balance Overall balance assessment:  (did not get to sitting this date due to BM, apparently consistently looses balance backward in unsupported sitting more than a few seconds)                                           Pertinent Vitals/Pain Pain Assessment: No/denies pain    Home Living Family/patient expects to be discharged to:: Skilled nursing facility                      Prior Function Level of Independence: Needs assistance   Gait / Transfers Assistance Needed: apparently pt has been struggling to take even a few +2 assisted steps (parallel bars) at rehab, has not truely walked in many many months.           Hand Dominance        Extremity/Trunk Assessment   Upper Extremity Assessment  Upper Extremity Assessment: Generalized weakness    Lower Extremity Assessment Lower Extremity Assessment: Generalized weakness (L ankle lacks DF to neutral, L LE grossly weaker than R)       Communication   Communication: No difficulties  Cognition Arousal/Alertness: Awake/alert Behavior During Therapy: WFL for tasks assessed/performed Overall Cognitive Status: Within Functional Limits for tasks assessed                                        General Comments      Exercises General Exercises - Lower Extremity Ankle Circles/Pumps: AROM;10 reps;Other (comment) (gentle PROM for L ankle DF) Heel Slides: AROM;10 reps (with resisted leg ext) Hip ABduction/ADduction: AROM;5 reps   Assessment/Plan    PT Assessment Patient needs continued PT services  PT Problem List  Decreased strength;Decreased activity tolerance;Decreased range of motion;Decreased balance;Decreased coordination;Decreased mobility;Decreased knowledge of use of DME;Decreased knowledge of precautions;Decreased safety awareness;Pain       PT Treatment Interventions DME instruction;Gait training;Functional mobility training;Therapeutic activities;Therapeutic exercise;Balance training;Neuromuscular re-education;Patient/family education;Cognitive remediation    PT Goals (Current goals can be found in the Care Plan section)  Acute Rehab PT Goals Patient Stated Goal: walk again PT Goal Formulation: With patient/family Time For Goal Achievement: 01/06/21 Potential to Achieve Goals: Poor    Frequency Min 2X/week   Barriers to discharge        Co-evaluation               AM-PAC PT "6 Clicks" Mobility  Outcome Measure Help needed turning from your back to your side while in a flat bed without using bedrails?: Total Help needed moving from lying on your back to sitting on the side of a flat bed without using bedrails?: Total Help needed moving to and from a bed to a chair (including a wheelchair)?: Total Help needed standing up from a chair using your arms (e.g., wheelchair or bedside chair)?: Total Help needed to walk in hospital room?: Total Help needed climbing 3-5 steps with a railing? : Total 6 Click Score: 6    End of Session   Activity Tolerance: Patient limited by fatigue;Other (comment) Patient left: in bed;with call bell/phone within reach;with nursing/sitter in room;with family/visitor present Nurse Communication: Mobility status (need for clean up) PT Visit Diagnosis: Muscle weakness (generalized) (M62.81);Difficulty in walking, not elsewhere classified (R26.2);Other symptoms and signs involving the nervous system (R29.898);Unsteadiness on feet (R26.81)    Time: 7078-6754 PT Time Calculation (min) (ACUTE ONLY): 37 min   Charges:   PT Evaluation $PT Eval Low  Complexity: 1 Low PT Treatments $Therapeutic Exercise: 8-22 mins        Kreg Shropshire, DPT 12/23/2020, 1:44 PM

## 2020-12-24 ENCOUNTER — Inpatient Hospital Stay
Admission: EM | Disposition: A | Discharge status: Skilled Nursing Facility | Payer: Medicare Other | Source: Ambulatory Visit | Attending: Internal Medicine

## 2020-12-24 ENCOUNTER — Encounter: Payer: Self-pay | Admitting: Internal Medicine

## 2020-12-24 DIAGNOSIS — Z992 Dependence on renal dialysis: Secondary | ICD-10-CM

## 2020-12-24 DIAGNOSIS — N186 End stage renal disease: Secondary | ICD-10-CM

## 2020-12-24 HISTORY — PX: DIALYSIS/PERMA CATHETER INSERTION: CATH118288

## 2020-12-24 LAB — CBC WITH DIFFERENTIAL/PLATELET
Abs Immature Granulocytes: 0.08 10*3/uL — ABNORMAL HIGH (ref 0.00–0.07)
Basophils Absolute: 0.1 10*3/uL (ref 0.0–0.1)
Basophils Relative: 0 %
Eosinophils Absolute: 0.1 10*3/uL (ref 0.0–0.5)
Eosinophils Relative: 1 %
HCT: 41.4 % (ref 36.0–46.0)
Hemoglobin: 12.9 g/dL (ref 12.0–15.0)
Immature Granulocytes: 1 %
Lymphocytes Relative: 6 %
Lymphs Abs: 0.9 10*3/uL (ref 0.7–4.0)
MCH: 27.7 pg (ref 26.0–34.0)
MCHC: 31.2 g/dL (ref 30.0–36.0)
MCV: 88.8 fL (ref 80.0–100.0)
Monocytes Absolute: 0.7 10*3/uL (ref 0.1–1.0)
Monocytes Relative: 5 %
Neutro Abs: 12.9 10*3/uL — ABNORMAL HIGH (ref 1.7–7.7)
Neutrophils Relative %: 87 %
Platelets: 251 10*3/uL (ref 150–400)
RBC: 4.66 MIL/uL (ref 3.87–5.11)
RDW: 20.3 % — ABNORMAL HIGH (ref 11.5–15.5)
WBC: 14.7 10*3/uL — ABNORMAL HIGH (ref 4.0–10.5)
nRBC: 0 % (ref 0.0–0.2)

## 2020-12-24 LAB — BASIC METABOLIC PANEL
Anion gap: 8 (ref 5–15)
BUN: 20 mg/dL (ref 8–23)
CO2: 24 mmol/L (ref 22–32)
Calcium: 8 mg/dL — ABNORMAL LOW (ref 8.9–10.3)
Chloride: 95 mmol/L — ABNORMAL LOW (ref 98–111)
Creatinine, Ser: 3.34 mg/dL — ABNORMAL HIGH (ref 0.44–1.00)
GFR, Estimated: 14 mL/min — ABNORMAL LOW (ref >60)
Glucose, Bld: 75 mg/dL (ref 70–99)
Potassium: 3.1 mmol/L — ABNORMAL LOW (ref 3.5–5.1)
Sodium: 127 mmol/L — ABNORMAL LOW (ref 135–145)

## 2020-12-24 SURGERY — DIALYSIS/PERMA CATHETER INSERTION
Anesthesia: Moderate Sedation

## 2020-12-24 MED ORDER — SODIUM CHLORIDE 0.9 % IV SOLN: INTRAVENOUS | Status: DC

## 2020-12-24 MED ORDER — FAMOTIDINE 20 MG PO TABS: 40.0000 mg | ORAL_TABLET | Freq: Once | ORAL | Status: DC | PRN

## 2020-12-24 MED ORDER — ONDANSETRON HCL 4 MG/2ML IJ SOLN: 4.0000 mg | Freq: Four times a day (QID) | INTRAMUSCULAR | Status: DC | PRN

## 2020-12-24 MED ORDER — MIDAZOLAM HCL 2 MG/ML PO SYRP: 8.0000 mg | ORAL_SOLUTION | Freq: Once | ORAL | Status: DC | PRN

## 2020-12-24 MED ORDER — METHYLPREDNISOLONE SODIUM SUCC 125 MG IJ SOLR: 125.0000 mg | Freq: Once | INTRAMUSCULAR | Status: DC | PRN

## 2020-12-24 MED ORDER — DIPHENHYDRAMINE HCL 50 MG/ML IJ SOLN: 50.0000 mg | Freq: Once | INTRAMUSCULAR | Status: DC | PRN

## 2020-12-24 MED ORDER — SODIUM CHLORIDE 0.9 % IV SOLN
3.0000 g | INTRAVENOUS | Status: DC
  Administered 2020-12-24: 3 g via INTRAVENOUS
  Filled 2020-12-24: qty 3
  Filled 2020-12-24: qty 8

## 2020-12-24 MED ORDER — FENTANYL CITRATE PF 50 MCG/ML IJ SOSY
PREFILLED_SYRINGE | INTRAMUSCULAR | Status: AC
  Filled 2020-12-24: qty 1

## 2020-12-24 MED ORDER — MIDAZOLAM HCL 2 MG/2ML IJ SOLN
INTRAMUSCULAR | Status: DC | PRN
  Administered 2020-12-24 (×2): 1 mg via INTRAVENOUS

## 2020-12-24 MED ORDER — MIDAZOLAM HCL 2 MG/2ML IJ SOLN
INTRAMUSCULAR | Status: AC
  Filled 2020-12-24: qty 2

## 2020-12-24 MED ORDER — HYDROMORPHONE HCL 1 MG/ML IJ SOLN
1.0000 mg | Freq: Once | INTRAMUSCULAR | Status: AC | PRN
  Administered 2020-12-24: 1 mg via INTRAVENOUS
  Filled 2020-12-24: qty 1

## 2020-12-24 MED ORDER — CLINDAMYCIN PHOSPHATE 300 MG/50ML IV SOLN
300.0000 mg | Freq: Once | INTRAVENOUS | Status: AC
Start: 2020-12-25 — End: 2020-12-24

## 2020-12-24 MED ORDER — CLINDAMYCIN PHOSPHATE 300 MG/50ML IV SOLN
INTRAVENOUS | Status: AC
  Administered 2020-12-24: 300 mg via INTRAVENOUS
  Filled 2020-12-24: qty 50

## 2020-12-24 MED ORDER — FENTANYL CITRATE (PF) 100 MCG/2ML IJ SOLN
INTRAMUSCULAR | Status: DC | PRN
  Administered 2020-12-24: 50 ug via INTRAVENOUS

## 2020-12-24 SURGICAL SUPPLY — 9 items
BIOPATCH RED 1 DISK 7.0 (GAUZE/BANDAGES/DRESSINGS) ×2 IMPLANT
BIOPATCH RED 1IN DISK 7.0MM (GAUZE/BANDAGES/DRESSINGS) ×1
CATH PALINDROME-P 19CM W/VT (CATHETERS) ×3 IMPLANT
GUIDEWIRE SUPER STIFF .035X180 (WIRE) ×3 IMPLANT
PACK ANGIOGRAPHY (CUSTOM PROCEDURE TRAY) ×3 IMPLANT
SUT MNCRL AB 4-0 PS2 18 (SUTURE) ×3 IMPLANT
SUT PROLENE 0 CT 1 30 (SUTURE) ×3 IMPLANT
SUT VIC AB 3-0 SH 27 (SUTURE) ×3
SUT VIC AB 3-0 SH 27X BRD (SUTURE) ×1 IMPLANT

## 2020-12-24 NOTE — Evaluation (Signed)
Occupational Therapy Evaluation Patient Details Name: Amy [name] MRN: [id number] DOB: 1950-05-06 Today's Date: 12/24/2020    History of Present Illness 70 y.o. female presenting to hospital after syncopal episode while at dialysis, here last month d/t concerns her R chest Port-A-Cath not functioning (B UE/LE edema) and acute on chronic systolic CHF with fluid overload, pulmonary edema, and anasarca. B/L LE pitting edema currently as well.  Permcath exchange 7/11.  PMH includes ESRD on HD MWF, chronic hyponatremia, chronic HA's, multiple admissions (most recent C-diff complicated by NSTEMI s/p PCI with DAPT), recent acute on chronic SDH s/p partial craniotomy for evacuation of SDH, anemia, L ulna shaft fx, gastroparesis, orthostasis, CHF, seizures, R hip fx, R IMN 04/2020, and knee surgery.   Clinical Impression   Pt seen for OT evaluation this date in setting of acute hospitalization d/t syncope. Pt with spouse and dtr present for parts of the evaluation. Pt and family state that she has been largely non-ambulatory since femur fx in Dec 2021. Pt's family reports that she has been at rehab in [facility] since May 2022, but admittedly, has had limited participation which has impacted her progress. Pt presents this date with increased motivation, but some reservations related to mobilization. Pt requires MOD to MAX A with bed mobility and demos Fair with regress to Poor sitting balance with becoming fatigued. Pt requires MOD/MAX A with arm in arm technique and knee blocking to CTS from slightly elevated EOB surface. Pt only tolerates standing ~10-15 seconds before requiring seated rest d/t feeling dizzy. Attempted to take orthostatics unsuccessfully. Pt unable to tolerate standing long enough to get standing pressure. Supine: 174/85, sitting 163/95. Pt returned to bed with all needs met and in reach. Pt's RN aware of session contents. Anticipate pt will requires continued skilled OT efforts both acutely  and in STR setting upon d/c to improve fxl activity tolerance  and strength for ADLs/ADL mobility.     Follow Up Recommendations  SNF    Equipment Recommendations  3 in 1 bedside commode;Tub/shower seat    Recommendations for Other Services       Precautions / Restrictions Precautions Precautions: Fall Restrictions Weight Bearing Restrictions: No      Mobility Bed Mobility Overal bed mobility: Needs Assistance Bed Mobility: Supine to Sit;Sit to Supine     Supine to sit: Min assist;Mod assist;HOB elevated Sit to supine: Mod assist;Max assist   General bed mobility comments: increased time and assist for back to bed to manage trunk and LEs, pt able to assist more with trunk in coming to sitting and is assisted with elevated HOB. Cues for sequence/hand and foot placement    Transfers Overall transfer level: Needs assistance Equipment used: 1 person hand held assist Transfers: Sit to/from Stand Sit to Stand: Mod assist;Max assist;From elevated surface         General transfer comment: MOD/MAX A from EOB elevated ~4-5", arm in arm technique with knee blocking    Balance Overall balance assessment: Needs assistance Sitting-balance support: Bilateral upper extremity supported Sitting balance-Leahy Scale: Poor Sitting balance - Comments: R or posterior lean, limited tolerance, is able to sustain static sit with F balance (use of UEs) for ~2-3 mins before needing to lean against HOB or before requriing OT assist.     Standing balance-Leahy Scale: Zero Standing balance comment: at least MOD A with arm in arm to sustain static stand and does not tolerate long d/t feeling dizzy.  ADL either performed or assessed with clinical judgement   ADL Overall ADL's : Needs assistance/impaired                                       General ADL Comments: Pt requires SETUP to MIN A for seated or bed level UB ADLs, MAX A for seated LB  ADLs d/t decreased strength and ROM.     Vision Baseline Vision/History: 1 Wears glasses Ability to See in Adequate Light: 0 Adequate Patient Visual Report: No change from baseline       Perception     Praxis      Pertinent Vitals/Pain Pain Assessment: No/denies pain     Hand Dominance Right   Extremity/Trunk Assessment Upper Extremity Assessment Upper Extremity Assessment: Generalized weakness (grip strength grossly 3+/5)   Lower Extremity Assessment Lower Extremity Assessment: Generalized weakness (bilateral decreased ankle DF strength with L slightly worse (unable to DF all the way to neutral).)       Communication Communication Communication: No difficulties   Cognition Arousal/Alertness: Awake/alert Behavior During Therapy: WFL for tasks assessed/performed Overall Cognitive Status: Within Functional Limits for tasks assessed                                     General Comments  attempted to take orthostatic BP. Pt unable to tolerate standing long enough to get standing pressure. Supine: 174/85, sitting 163/95.    Exercises Other Exercises Other Exercises: OT ed re: role of OT in acute setting, matching participation efforts to her goals, importance of OOB activity, importance of moving UEs to reduce edema, importance of dorsiflexion to preserve anterior tibialis strength and reduce risk for developing permanent foot drop. Pt with moderate reception, pt's family (spouse and daughter) with good reception.   Shoulder Instructions      Home Living Family/patient expects to be discharged to:: Skilled nursing facility                                        Prior Functioning/Environment Level of Independence: Needs assistance  Gait / Transfers Assistance Needed: apparently pt has been struggling to take even a few +2 assisted steps (parallel bars) at rehab, has not truely walked in many many months (family reports since  December). ADL's / Homemaking Assistance Needed: Spouse assisting with ADL's/facility staff assisting with ADLs            OT Problem List: Decreased strength;Decreased range of motion;Decreased activity tolerance;Impaired balance (sitting and/or standing);Decreased knowledge of use of DME or AE;Cardiopulmonary status limiting activity      OT Treatment/Interventions: Self-care/ADL training;Therapeutic exercise;DME and/or AE instruction;Patient/family education;Balance training;Therapeutic activities    OT Goals(Current goals can be found in the care plan section) Acute Rehab OT Goals Patient Stated Goal: walk again OT Goal Formulation: With patient/family Time For Goal Achievement: 01/07/21 Potential to Achieve Goals: Good  OT Frequency: Min 1X/week   Barriers to D/C:            Co-evaluation              AM-PAC OT "6 Clicks" Daily Activity     Outcome Measure Help from another person eating meals?: None Help from another person taking care of personal grooming?:  A Little Help from another person toileting, which includes using toliet, bedpan, or urinal?: A Lot Help from another person bathing (including washing, rinsing, drying)?: A Lot Help from another person to put on and taking off regular upper body clothing?: A Little Help from another person to put on and taking off regular lower body clothing?: A Lot 6 Click Score: 16   End of Session Equipment Utilized During Treatment: Gait belt Nurse Communication: Mobility status;Other (comment) (attmpted orthostatic BP, unsuccessful getting standing BP d/t dizziness and limited tolerance.)  Activity Tolerance: Patient tolerated treatment well Patient left: in bed;with call bell/phone within reach;with bed alarm set  OT Visit Diagnosis: Unsteadiness on feet (R26.81);Muscle weakness (generalized) (M62.81)                Time: 0034-9179 OT Time Calculation (min): 45 min Charges:  OT General Charges $OT Visit: 1  Visit OT Evaluation $OT Eval Moderate Complexity: 1 Mod OT Treatments $Self Care/Home Management : 8-22 mins $Therapeutic Activity: 8-22 mins Gerrianne Scale, MS, OTR/L ascom [phone] 12/24/20, 2:19 PM

## 2020-12-24 NOTE — Op Note (Signed)
OPERATIVE NOTE    PRE-OPERATIVE DIAGNOSIS: 1. ESRD 2. Non-functional permcath  POST-OPERATIVE DIAGNOSIS: same as above  PROCEDURE: Fluoroscopic guidance for placement of catheter Placement of a 19 cm tip to cuff tunneled hemodialysis catheter via the right internal jugular vein and removal of previous catheter  SURGEON: Leotis Pain, MD  ANESTHESIA:  Local with moderate conscious sedation for 16 minutes using 2 mg of Versed and 50 mcg of Fentanyl  ESTIMATED BLOOD LOSS: 3 cc  FINDING(S): none  SPECIMEN(S):  None  INDICATIONS:   Patient is a 70 y.o.female who presents with non-functional dialysis catheter and ESRD.  The patient needs long term dialysis access for their ESRD, and a Permcath is necessary.  Risks and benefits are discussed and informed consent is obtained.    DESCRIPTION: After obtaining full informed written consent, the patient was brought back to the vascular suite. The patient received moderate conscious sedation during a face-to-face encounter with me present throughout the entire procedure and supervising the RN monitoring the vital signs, pulse oximetry, telemetry, and mental status throughout the entire procedure. The patient's existing catheter, right neck and chest were sterilely prepped and draped in a sterile surgical field was created.  The existing catheter was dissected free from the fibrous sheath securing the cuff with hemostats and blunt dissection.  A wire was placed. The existing catheter was then removed and the wire used to keep venous access. I selected a 19 cm tip to cuff tunneled dialysis catheter.  Using fluoroscopic guidance the catheter tips were parked in the right atrium. The appropriate distal connectors were placed. It withdrew blood well and flushed easily with heparinized saline and a concentrated heparin solution was then placed. It was secured to the chest wall with 2 Prolene sutures. A 4-0 Monocryl pursestring suture was placed around the exit  site. Sterile dressings were placed. The patient tolerated the procedure well and was taken to the recovery room in stable condition.  COMPLICATIONS: None  CONDITION: Stable  Leotis Pain 12/24/2020 11:40 AM   This note was created with Dragon Medical transcription system. Any errors in dictation are purely unintentional.

## 2020-12-24 NOTE — Progress Notes (Signed)
PROGRESS NOTE    Amy [name]  JKK:[id number] DOB: December 22, 1950 DOA: 12/22/2020 PCP: Murlean Iba, MD   Brief Narrative:  70 year old female with end-stage renal disease on hemodialysis on Monday Wednesday Friday, CAD, subdural hematoma status postevacuation (07/25/2020), seizure disorder, hypertension, hyperlipidemia, chronic hyponatremia and anemia of chronic disease.  Presented with syncopal episode with dialysis and sent into the hospital for further evaluation and found to have acute cystitis with hematuria.  Underwent hemodialysis attempts on 8/23.  PermCath nonfunctional.  Vascular surgery consulted for catheter exchange, planned 8/24   Assessment & Plan:   Principal Problem:   Syncope, vasovagal Active Problems:   Hypokalemia   Hyponatremia   HLD (hyperlipidemia)   ESRD on dialysis [facility])   Ischemic cardiomyopathy   Chronic combined systolic and diastolic CHF (congestive heart failure) (HCC)   Seizure disorder (HCC)   Chronic systolic CHF (congestive heart failure) (HCC)   Leukocytosis   Acute cystitis  Acute cystitis associated with hematuria and severe leukocytosis Was on Macrodantin as outpatient, poor choice for dialysis patient On Rocephin here White cell count 48 on presentation Subsequently downtrending Patient does not appear toxic Plan: Continue Rocephin 1 g every 24 hours Plan for 3-day course Monitor vitals and fever curve  ESRD on hemodialysis Syncope with dialysis Nonfunctional PermCath Patient has syncopal event and outpatient HD chair No evidence of ACS Unclear nature of her syncope Plan: Catheter exchange today Nephrology following for inpatient HD Hemodialysis attempt after permacath versus tomorrow  Chronic systolic congestive heart failure Ischemic cardiomyopathy Last known ejection fraction 25 to 30% Patient known to [facility] Dr. Rockey Situ Plan: Continue metoprolol HD for fluid management Follow-up outpatient cardiology  Seizure  disorder PTA Vimpat  Anemia of chronic disease Stable  Depression PTA Zoloft  History of subdural hematoma status post partial craniectomy At [facility] in March 2022 Stable Outpatient follow-up  Hyponatremia Asymptomatic Hemodialysis for management   DVT prophylaxis: SCD Code Status: DNR Family Communication: Spouse [name] at bedside 8/24 Disposition Plan: Status is: Inpatient  Remains inpatient appropriate because:Inpatient level of care appropriate due to severity of illness  Dispo: The patient is from: ALF              Anticipated d/c is to: ALF              Patient currently is not medically stable to d/c.   Difficult to place patient No  PermCath exchange today.  Hemodialysis either afterwards today or first thing tomorrow.  Anticipate discharge 8/25     Level of care: Med-Surg  Consultants:  Nephrology Vascular surgery  Procedures:  PermCath exchange 8/24  Antimicrobials:  Ceftriaxone   Subjective: Patient seen and examined.  Resting comfortably in bed.  Husband at bedside.  No visible distress.  No pain complaints.  Objective: Vitals:   12/23/20 1940 12/24/20 0419 12/24/20 0823 12/24/20 1003  BP: (!) 173/82 (!) 182/89 (!) 174/85 (!) 163/95  Pulse: 70 70 72 87  Resp: 17 16 16    Temp: 97.7 F (36.5 C) 97.9 F (36.6 C) 97.7 F (36.5 C)   TempSrc: Oral     SpO2: 97% 97% 98%   Weight:      Height:        Intake/Output Summary (Last 24 hours) at 12/24/2020 1049 Last data filed at 12/24/2020 0353 Gross per 24 hour  Intake 460 ml  Output --  Net 460 ml   Filed Weights   12/23/20 1455  Weight: 52.5 kg    Examination:  General  exam: Appears calm and comfortable  Respiratory system: Clear to auscultation. Respiratory effort normal. Cardiovascular system: S1-S2, regular rate and rhythm, distant heart sounds, trace pedal edema bilateral lower extremities Gastrointestinal system: Soft, nondistended, mild TTP suprapubic region, normal bowel  sounds Central nervous system: Alert and oriented. No focal neurological deficits. Extremities: Symmetric 5 x 5 power. Skin: No rashes, lesions or ulcers Psychiatry: Judgement and insight appear normal. Mood & affect appropriate.     Data Reviewed: I have personally reviewed following labs and imaging studies  CBC: Recent Labs  Lab 12/22/20 1223 12/23/20 0615  WBC 47.8*  48.0* 22.5*  NEUTROABS 44.4*  --   HGB 13.1  13.1 12.4  HCT 42.3  41.9 40.6  MCV 89.8  88.8 88.5  PLT 292  287 357   Basic Metabolic Panel: Recent Labs  Lab 12/22/20 1223 12/23/20 0615 12/24/20 0428  NA 130* 131* 127*  K 3.0* 4.1 3.1*  CL 96* 96* 95*  CO2 23 24 24   GLUCOSE 89 76 75  BUN 14 17 20   CREATININE 2.79* 3.17* 3.34*  CALCIUM 8.6* 8.5* 8.0*  MG 1.7  --   --    GFR: Estimated Creatinine Clearance: 12.6 mL/min (A) (by C-G formula based on SCr of 3.34 mg/dL (H)). Liver Function Tests: Recent Labs  Lab 12/22/20 1223  AST 23  ALT 15  ALKPHOS 176*  BILITOT 0.7  PROT 4.9*  ALBUMIN 2.1*   No results for input(s): LIPASE, AMYLASE in the last 168 hours. No results for input(s): AMMONIA in the last 168 hours. Coagulation Profile: No results for input(s): INR, PROTIME in the last 168 hours. Cardiac Enzymes: No results for input(s): CKTOTAL, CKMB, CKMBINDEX, TROPONINI in the last 168 hours. BNP (last 3 results) No results for input(s): PROBNP in the last 8760 hours. HbA1C: No results for input(s): HGBA1C in the last 72 hours. CBG: No results for input(s): GLUCAP in the last 168 hours. Lipid Profile: No results for input(s): CHOL, HDL, LDLCALC, TRIG, CHOLHDL, LDLDIRECT in the last 72 hours. Thyroid Function Tests: No results for input(s): TSH, T4TOTAL, FREET4, T3FREE, THYROIDAB in the last 72 hours. Anemia Panel: No results for input(s): VITAMINB12, FOLATE, FERRITIN, TIBC, IRON, RETICCTPCT in the last 72 hours. Sepsis Labs: Recent Labs  Lab 12/22/20 1853  PROCALCITON 12.68     Recent Results (from the past 240 hour(s))  Urine Culture     Status: Abnormal (Preliminary result)   Collection Time: 12/22/20  7:50 PM   Specimen: Urine, Clean Catch  Result Value Ref Range Status   Specimen Description   Final    URINE, CLEAN CATCH Performed at [facility], [address], [place], [place] [address]    Special Requests   Final    NONE Performed at [facility], [address] [place], [place] [address]    Culture (A)  Final    >=100,000 COLONIES/mL CITROBACTER SPECIES >=100,000 COLONIES/mL KLEBSIELLA OXYTOCA 60,000 COLONIES/mL ENTEROCOCCUS FAECALIS    Report Status PENDING  Incomplete  Blood culture (single)     Status: None (Preliminary result)   Collection Time: 12/22/20  8:55 PM   Specimen: BLOOD  Result Value Ref Range Status   Specimen Description BLOOD BLOOD RIGHT WRIST  Final   Special Requests   Final    BOTTLES DRAWN AEROBIC AND ANAEROBIC Blood Culture results may not be optimal due to an inadequate volume of blood received in culture bottles   Culture   Final    NO GROWTH 2  DAYS Performed at [facility], [address] [place], [place] [address]    Report Status PENDING  Incomplete  Resp Panel by RT-PCR (Flu A&B, Covid) Nasopharyngeal Swab     Status: None   Collection Time: 12/23/20  8:50 AM   Specimen: Nasopharyngeal Swab; Nasopharyngeal(NP) swabs in vial transport medium  Result Value Ref Range Status   SARS Coronavirus 2 by RT PCR NEGATIVE NEGATIVE Final    Comment: (NOTE) SARS-CoV-2 target nucleic acids are NOT DETECTED.  The SARS-CoV-2 RNA is generally detectable in upper respiratory specimens during the acute phase of infection. The lowest concentration of SARS-CoV-2 viral copies this assay can detect is 138 copies/mL. A negative result does not preclude SARS-Cov-2 infection and should not be used as the sole basis for treatment or other patient management decisions. A negative result may occur  with  improper specimen collection/handling, submission of specimen other than nasopharyngeal swab, presence of viral mutation(s) within the areas targeted by this assay, and inadequate number of viral copies(<138 copies/mL). A negative result must be combined with clinical observations, patient history, and epidemiological information. The expected result is Negative.  Fact Sheet for Patients:  EntrepreneurPulse.com.au  Fact Sheet for Healthcare Providers:  IncredibleEmployment.be  This test is no t yet approved or cleared by the [place] FDA and  has been authorized for detection and/or diagnosis of SARS-CoV-2 by FDA under an Emergency Use Authorization (EUA). This EUA will remain  in effect (meaning this test can be used) for the duration of the COVID-19 declaration under Section 564(b)(1) of the Act, 21 U.S.C.section 360bbb-3(b)(1), unless the authorization is terminated  or revoked sooner.       Influenza A by PCR NEGATIVE NEGATIVE Final   Influenza B by PCR NEGATIVE NEGATIVE Final    Comment: (NOTE) The Xpert Xpress SARS-CoV-2/FLU/RSV plus assay is intended as an aid in the diagnosis of influenza from Nasopharyngeal swab specimens and should not be used as a sole basis for treatment. Nasal washings and aspirates are unacceptable for Xpert Xpress SARS-CoV-2/FLU/RSV testing.  Fact Sheet for Patients: EntrepreneurPulse.com.au  Fact Sheet for Healthcare Providers: IncredibleEmployment.be  This test is not yet approved or cleared by the [place] FDA and has been authorized for detection and/or diagnosis of SARS-CoV-2 by FDA under an Emergency Use Authorization (EUA). This EUA will remain in effect (meaning this test can be used) for the duration of the COVID-19 declaration under Section 564(b)(1) of the Act, 21 U.S.C. section 360bbb-3(b)(1), unless the authorization is terminated  or revoked.  Performed at [facility], [address] [place], [place] [address]   MRSA Next Gen by PCR, Nasal     Status: None   Collection Time: 12/23/20  4:51 PM   Specimen: Nasal Mucosa; Nasal Swab  Result Value Ref Range Status   MRSA by PCR Next Gen NOT DETECTED NOT DETECTED Final    Comment: (NOTE) The GeneXpert MRSA Assay (FDA approved for NASAL specimens only), is one component of a comprehensive MRSA colonization surveillance program. It is not intended to diagnose MRSA infection nor to guide or monitor treatment for MRSA infections. Test performance is not FDA approved in patients less than 61 years old. Performed at [facility], [address] [place], [place] [address]          Radiology Studies: CT ABDOMEN PELVIS WO CONTRAST  Result Date: 12/22/2020 CLINICAL DATA:  Acute nonlocalized abdominal pain. EXAM: CT ABDOMEN AND PELVIS WITHOUT CONTRAST TECHNIQUE: Multidetector CT imaging of the  abdomen and pelvis was performed following the standard protocol without IV contrast. COMPARISON:  09/27/2014 FINDINGS: Lower chest: Moderate to large bilateral pleural effusions with basilar atelectasis. Hepatobiliary: No focal liver abnormality is seen. Status post cholecystectomy. No biliary dilatation. Pancreas: Unremarkable. No pancreatic ductal dilatation or surrounding inflammatory changes. Spleen: Normal in size without focal abnormality. Adrenals/Urinary Tract: No adrenal gland nodules. Increased density in the kidneys could represent intrarenal stones or more likely residual contrast material from prior contrast administration. No hydronephrosis or hydroureter. Bladder is decompressed. There appears to be a stone in the dependent bladder on the right. Stomach/Bowel: Stomach, small bowel, and colon are mostly decompressed. Scattered colonic diverticula without evidence of diverticulitis. Appendix is not identified. Vascular/Lymphatic: Scattered aortic  calcification. No aneurysm. Scattered retroperitoneal lymph nodes without pathologic enlargement. Reproductive: Status post hysterectomy. No adnexal masses. Other: Moderate free fluid in the abdomen and pelvis, likely ascites. Diffuse edema throughout the subcutaneous fatty tissues of the abdomen, pelvis, and visualized lower chest. No free air. Musculoskeletal: Previous internal fixation of the right hip. Degenerative changes in the spine. No destructive bone lesions. IMPRESSION: 1. Moderate bilateral pleural effusions with basilar atelectasis. 2. Moderate abdominal and pelvic ascites. 3. Diffuse soft tissue edema. 4. Increased density in the kidneys may represent multiple intrarenal stones or residual contrast material. Suggestion of a stone in the bladder. 5. Colonic diverticula without evidence of diverticulitis. 6. Aortic atherosclerosis. Electronically Signed   By: Lucienne Capers M.D.   On: 12/22/2020 18:04   DG Chest 2 View  Result Date: 12/22/2020 CLINICAL DATA:  Syncopal episode. EXAM: CHEST - 2 VIEW COMPARISON:  11/07/2020 FINDINGS: Right central venous catheter with tip over the low SVC region. Shallow inspiration. Heart size and pulmonary vascularity are normal for technique. There are bilateral pleural effusions, greater on the left. No perihilar edema or infiltration. Atelectasis in the lung bases. Mediastinal contours appear intact. IMPRESSION: Bilateral pleural effusions with basilar atelectasis, greater on the left. Electronically Signed   By: Lucienne Capers M.D.   On: 12/22/2020 18:17   CT HEAD WO CONTRAST (5MM)  Result Date: 12/22/2020 CLINICAL DATA:  Cerebral hemorrhage suspected. Syncopal episode. Witnessed syncopal episode for 45 seconds during dialysis. EXAM: CT HEAD WITHOUT CONTRAST TECHNIQUE: Contiguous axial images were obtained from the base of the skull through the vertex without intravenous contrast. COMPARISON:  07/25/2020 FINDINGS: Brain: Since the previous study there has  been interval operative evacuation of the large subdural hematoma previously seen. There is a small chronic subdural fluid collection demonstrated today. Mild thickening of the dura is likely postoperative. No recurrent hemorrhage is identified. Chronic atrophy. Ventricular dilatation consistent with central atrophy. Low-attenuation changes in the deep white matter consistent small vessel ischemia. Vascular: Moderate intracranial vascular calcifications. Skull: Postoperative left frontoparietal craniotomy with plate and screw fixation of the bone flap. Sinuses/Orbits: Paranasal sinuses and mastoid air cells are clear. Other: None. IMPRESSION: 1. Postoperative changes with interval evacuation of previous large subdural hematoma. Postoperative changes and small chronic subdural collection seen today. No evidence of recurrent intracranial hemorrhage. 2. Chronic atrophy and small vessel ischemic changes. Electronically Signed   By: Lucienne Capers M.D.   On: 12/22/2020 17:59        Scheduled Meds:  aspirin  81 mg Oral Daily   atorvastatin  40 mg Oral Daily   brivaracetam  75 mg Oral BID   Chlorhexidine Gluconate Cloth  6 each Topical Q0600   lacosamide  50 mg Oral BID   melatonin  5 mg Oral  QHS   metoprolol tartrate  12.5 mg Oral BID   multivitamin  1 tablet Oral QHS   pantoprazole  40 mg Oral Daily   sertraline  50 mg Oral Daily   sodium chloride flush  3 mL Intravenous Q12H   Continuous Infusions:  sodium chloride     cefTRIAXone (ROCEPHIN)  IV Stopped (12/23/20 2147)   [START ON 12/25/2020] clindamycin (CLEOCIN) IV       LOS: 1 day    Time spent: 35 minutes    Sidney Ace, MD [facility] Pager [phone]  If 7PM-7AM, please contact night-coverage 12/24/2020, 10:49 AM

## 2020-12-24 NOTE — Progress Notes (Signed)
PT Cancellation Note  Patient Details Name: Amy [name] MRN: [id number] DOB: 01-Feb-1951   Cancelled Treatment:    Reason Eval/Treat Not Completed: Patient at procedure or test/unavailable Pt worked with OT this AM, had HD port exchange and is now at dialysis.  Will maintain on caseload and continue to see as appropriate.  Kreg Shropshire, DPT 12/24/2020, 4:20 PM

## 2020-12-24 NOTE — Interval H&P Note (Signed)
History and Physical Interval Note:  12/24/2020 11:18 AM  Amy [name]  has presented today for surgery, with the diagnosis of ESRD.  The various methods of treatment have been discussed with the patient and family. After consideration of risks, benefits and other options for treatment, the patient has consented to  Procedure(s): DIALYSIS/PERMA CATHETER INSERTION / EXCHANGE (N/A) as a surgical intervention.  The patient's history has been reviewed, patient examined, no change in status, stable for surgery.  I have reviewed the patient's chart and labs.  Questions were answered to the patient's satisfaction.     Leotis Pain

## 2020-12-24 NOTE — NC FL2 (Signed)
[place] LEVEL OF CARE SCREENING TOOL     IDENTIFICATION  Patient Name: Amy [name] Birthdate: 13-Nov-1950 Sex: female Admission Date (Current Location): 12/22/2020  [name] Number:  Engineering geologist and Address:  [facility], [address], [place], [place] [address]      Provider Number: [id number]  Attending Physician Name and Address:  Sidney Ace, MD  Relative Name and Phone Number:       Current Level of Care: Hospital Recommended Level of Care: [place] Prior Approval Number:    Date Approved/Denied:   PASRR Number: [id number]  Discharge Plan: SNF    Current Diagnoses: Patient Active Problem List   Diagnosis Date Noted   Acute cystitis 12/23/2020   Acute cystitis with hematuria    Anasarca    Syncope, vasovagal 02/27/2535   Complication associated with dialysis catheter    Pulmonary edema 11/07/2020   CAD (coronary artery disease) of artery bypass graft 09/15/2020   History of nausea and vomiting 09/15/2020   Leukocytosis    Sleep disturbance    SDH (subdural hematoma) ([place]) 08/07/2020   Acute on chronic anemia    Subdural hemorrhage following injury ([place]) 08/04/2020   Orthostasis    Gastroparesis    Chronic systolic CHF (congestive heart failure) (HCC)    Nausea    Hypoalbuminemia due to protein-calorie malnutrition (HCC)    Nausea and vomiting    Chronic combined systolic and diastolic CHF (congestive heart failure) ([place])    Anemia of chronic disease    Seizure disorder ([place])    Chronic pain syndrome    Debility 07/04/2020   Ischemic cardiomyopathy    Protein-calorie malnutrition, severe 06/23/2020   Non-ST elevation (NSTEMI) myocardial infarction ([place])    Pressure injury of skin 06/21/2020   C. difficile diarrhea 06/19/2020   C. difficile colitis 06/19/2020   Seizure ([place]) 06/19/2020   Gastroenteritis due to COVID-19 virus 05/29/2020   Surgery, elective    S/P  ORIF (open reduction internal fixation) fracture    Closed right hip fracture, initial encounter ([place]) 04/22/2020   Hypotension 12/16/2019   ESRD on dialysis ([place]) 12/16/2019   Depression with anxiety 12/16/2019   Hypomagnesemia 12/16/2019   Atrial flutter (HCC)    Atrial fibrillation with RVR ([place]) 04/03/2019   Benign essential HTN 04/03/2019   HLD (hyperlipidemia) 04/03/2019   ESRD (end stage renal disease) ([place]) 12/13/2018   Itching 11/08/2018   Hyperparathyroidism, secondary renal ([place]) 09/13/2017   Ground glass opacity present on imaging of lung 04/08/2017   Osteoarthritis of knee 12/29/2016   Normocytic anemia 10/14/2016   Swelling of limb    Chronic headaches    Gastritis    History of adenomatous polyp of colon 09/07/2016   Chronic abdominal pain 09/05/2016   Iron deficiency anemia 09/05/2016   Hyponatremia 09/05/2016   Multiple thyroid nodules 07/04/2015   Right thyroid nodule 06/27/2015   Allergic rhinitis, seasonal 12/03/2014   Fever blister 12/03/2014   Herniated lumbar intervertebral disc 12/03/2014   Hypokalemia 12/03/2014   Recurrent sinus infections 12/03/2014   Insomnia 11/05/2014   Acquired scoliosis 06/26/2014   Intervertebral disc stenosis of neural canal of lumbar region 06/20/2014   Lumbar facet arthropathy 05/21/2014   Degenerative spondylolisthesis 03/12/2014   OP (osteoporosis) 08/10/2013   Idiopathic peripheral neuropathy 03/24/2009   Edema 12/27/2007   Headache, tension-type 10/25/2006   Primary hypertension 10/24/2006   Anxiety 05/03/1998   Esophageal reflux 05/03/1998    Orientation  RESPIRATION BLADDER Height & Weight     Self, Time, Situation, Place  Normal Incontinent, External catheter Weight: 115 lb 11.9 oz (52.5 kg) Height:  5\' 4"  (162.6 cm)  BEHAVIORAL SYMPTOMS/MOOD NEUROLOGICAL BOWEL NUTRITION STATUS   (None)  (Seizure disorder) Incontinent Diet (See discharge summary when available. Currently NPO.)  AMBULATORY STATUS  COMMUNICATION OF NEEDS Skin   Extensive Assist Verbally Skin abrasions, Bruising, Other (Comment), PU Stage and Appropriate Care (Weeping. Skin tear on right anterior knee (foam daily) and left distal anterior thigh (foam daily).)   PU Stage 2 Dressing:  (Medial sacrum: Foam every 3 days.)                   Personal Care Assistance Level of Assistance  Bathing, Feeding, Dressing Bathing Assistance: Maximum assistance Feeding assistance: Limited assistance Dressing Assistance: Maximum assistance     Functional Limitations Info  Sight, Hearing, Speech Sight Info: Adequate Hearing Info: Adequate Speech Info: Adequate    SPECIAL CARE FACTORS FREQUENCY  PT (By licensed PT), OT (By licensed OT)     PT Frequency: 5 x week OT Frequency: 5 x week            Contractures Contractures Info: Not present    Additional Factors Info  Code Status, Allergies Code Status Info: DNR Allergies Info: Cephalosporins, Clarithromycin, Ensure (Nutritional Supplements), Gabapentin, Lunesta  (Eszopiclone), Nepro (Compleat), Penicillin V, Sulfa Antibiotics           Current Medications (12/24/2020):  This is the current hospital active medication list Current Facility-Administered Medications  Medication Dose Route Frequency Provider Last Rate Last Admin   acetaminophen (TYLENOL) tablet 650 mg  650 mg Oral Q6H PRN Sharion Settler, NP   650 mg at 12/23/20 2127   Or   acetaminophen (TYLENOL) suppository 650 mg  650 mg Rectal Q6H PRN Sharion Settler, NP       aspirin chewable tablet 81 mg  81 mg Oral Daily Lenore Cordia, MD   81 mg at 12/23/20 1145   atorvastatin (LIPITOR) tablet 40 mg  40 mg Oral Daily Lenore Cordia, MD   40 mg at 12/23/20 1145   brivaracetam (BRIVIACT) tablet 75 mg  75 mg Oral BID Lenore Cordia, MD       calcium carbonate (TUMS - dosed in mg elemental calcium) chewable tablet 750-1,500 mg  750-1,500 mg Oral PRN Wieting, Richard, MD       cefTRIAXone (ROCEPHIN) 1 g in  sodium chloride 0.9 % 100 mL IVPB  1 g Intravenous Q24H Lenore Cordia, MD   Stopped at 12/23/20 2147   Chlorhexidine Gluconate Cloth 2 % PADS 6 each  6 each Topical Q0600 Lavonia Dana, MD   6 each at 12/24/20 0537   lacosamide (VIMPAT) tablet 50 mg  50 mg Oral BID Zada Finders R, MD   50 mg at 12/24/20 0851   melatonin tablet 5 mg  5 mg Oral QHS Zada Finders R, MD   5 mg at 12/23/20 2058   metoprolol tartrate (LOPRESSOR) tablet 12.5 mg  12.5 mg Oral BID Zada Finders R, MD   12.5 mg at 12/23/20 2058   multivitamin (RENA-VIT) tablet 1 tablet  1 tablet Oral QHS Zada Finders R, MD   1 tablet at 12/23/20 2058   nitroGLYCERIN (NITROSTAT) SL tablet 0.4 mg  0.4 mg Sublingual Q5 min PRN Loletha Grayer, MD       ondansetron [facility]) tablet 4 mg  4 mg Oral Q6H PRN Zada Finders  R, MD       Or   ondansetron (ZOFRAN) injection 4 mg  4 mg Intravenous Q6H PRN Lenore Cordia, MD       pantoprazole (PROTONIX) EC tablet 40 mg  40 mg Oral Daily Zada Finders R, MD   40 mg at 12/23/20 1145   senna-docusate (Senokot-S) tablet 1 tablet  1 tablet Oral QHS PRN Lenore Cordia, MD       sertraline (ZOLOFT) tablet 50 mg  50 mg Oral Daily Wieting, Richard, MD   50 mg at 12/23/20 1443   sodium chloride flush (NS) 0.9 % injection 3 mL  3 mL Intravenous Q12H Zada Finders R, MD   3 mL at 12/23/20 2059   traZODone (DESYREL) tablet 25-50 mg  25-50 mg Oral QHS PRN Lenore Cordia, MD   50 mg at 12/23/20 2343     Discharge Medications: Please see discharge summary for a list of discharge medications.  Relevant Imaging Results:  Relevant Lab Results:   Additional Information SS#: [id number]. HD MWF [facility] 10:30. COVID Vaccines: Moderna 06/15/19, 07/16/19, 09/15/20.  Candie Chroman, LCSW

## 2020-12-24 NOTE — Progress Notes (Signed)
[facility]  ROUNDING NOTE   Subjective:   [name] was admitted to [facility] on 12/22/2020 for Syncope and collapse [R55] Chronic hyponatremia [E87.1] Hypokalemia [E87.6] Syncope [R55] ESRD (end stage renal disease) ([place]) [N18.6] Acute cystitis without hematuria [N30.00] Leukocytosis, unspecified type [D72.829] Acute cystitis [N30.00]  Last hemodialysis treatment was yesterday. She received 35 minutes of treatment and then had a syncopal episode. She had 473mL of fluid removed.   Patient resting in bed Husband at bedside.  Currently NPO for procedure Denies shortness of breath   Objective:  Vital signs in last 24 hours:  Temp:  [97.5 F (36.4 C)-98.6 F (37 C)] 97.7 F (36.5 C) (08/24 0823) Pulse Rate:  [70-87] 87 (08/24 1003) Resp:  [16-22] 16 (08/24 0823) BP: (156-182)/(80-95) 163/95 (08/24 1003) SpO2:  [97 %-98 %] 98 % (08/24 0823) Weight:  [52.5 kg] 52.5 kg (08/23 1455)  Weight change:  Filed Weights   12/23/20 1455  Weight: 52.5 kg    Intake/Output: I/O last 3 completed shifts: In: 460 [P.O.:360; IV Piggyback:100] Out: -    Intake/Output this shift:  No intake/output data recorded.  Physical Exam: General: NAD, resting in bed  Head: Normocephalic, atraumatic. Moist oral mucosal membranes  Eyes: Anicteric  Lungs:  Clear to auscultation, normal effort  Heart: Regular rate and rhythm  Abdomen:  Soft, nontender,   Extremities:  + peripheral edema.  Neurologic: Nonfocal, moving all four extremities  Skin: No lesions  Access: RIJ permcath    Basic Metabolic Panel: Recent Labs  Lab 12/22/20 1223 12/23/20 0615 12/24/20 0428  NA 130* 131* 127*  K 3.0* 4.1 3.1*  CL 96* 96* 95*  CO2 23 24 24   GLUCOSE 89 76 75  BUN 14 17 20   CREATININE 2.79* 3.17* 3.34*  CALCIUM 8.6* 8.5* 8.0*  MG 1.7  --   --      Liver Function Tests: Recent Labs  Lab 12/22/20 1223  AST 23  ALT 15  ALKPHOS 176*  BILITOT 0.7  PROT 4.9*  ALBUMIN 2.1*     No results for input(s): LIPASE, AMYLASE in the last 168 hours. No results for input(s): AMMONIA in the last 168 hours.  CBC: Recent Labs  Lab 12/22/20 1223 12/23/20 0615  WBC 47.8*  48.0* 22.5*  NEUTROABS 44.4*  --   HGB 13.1  13.1 12.4  HCT 42.3  41.9 40.6  MCV 89.8  88.8 88.5  PLT 292  287 259     Cardiac Enzymes: No results for input(s): CKTOTAL, CKMB, CKMBINDEX, TROPONINI in the last 168 hours.  BNP: Invalid input(s): POCBNP  CBG: No results for input(s): GLUCAP in the last 168 hours.  Microbiology: Results for orders placed or performed during the hospital encounter of 12/22/20  Urine Culture     Status: Abnormal (Preliminary result)   Collection Time: 12/22/20  7:50 PM   Specimen: Urine, Clean Catch  Result Value Ref Range Status   Specimen Description   Final    URINE, CLEAN CATCH Performed at [facility], [address] [place], [place] [address]    Special Requests   Final    NONE Performed at [facility], [address] [place], [place] [address]    Culture (A)  Final    >=100,000 COLONIES/mL CITROBACTER SPECIES >=100,000 COLONIES/mL KLEBSIELLA OXYTOCA 60,000 COLONIES/mL ENTEROCOCCUS FAECALIS    Report Status PENDING  Incomplete  Blood culture (single)     Status: None (Preliminary result)   Collection Time: 12/22/20  8:55 PM   Specimen: BLOOD  Result Value Ref Range Status   Specimen Description BLOOD BLOOD RIGHT WRIST  Final   Special Requests   Final    BOTTLES DRAWN AEROBIC AND ANAEROBIC Blood Culture results may not be optimal due to an inadequate volume of blood received in culture bottles   Culture   Final    NO GROWTH 2 DAYS Performed at [facility], [address] [place], [place] [address]    Report Status PENDING  Incomplete  Resp Panel by RT-PCR (Flu A&B, Covid) Nasopharyngeal Swab     Status: None   Collection Time: 12/23/20  8:50 AM   Specimen: Nasopharyngeal Swab; Nasopharyngeal(NP)  swabs in vial transport medium  Result Value Ref Range Status   SARS Coronavirus 2 by RT PCR NEGATIVE NEGATIVE Final    Comment: (NOTE) SARS-CoV-2 target nucleic acids are NOT DETECTED.  The SARS-CoV-2 RNA is generally detectable in upper respiratory specimens during the acute phase of infection. The lowest concentration of SARS-CoV-2 viral copies this assay can detect is 138 copies/mL. A negative result does not preclude SARS-Cov-2 infection and should not be used as the sole basis for treatment or other patient management decisions. A negative result may occur with  improper specimen collection/handling, submission of specimen other than nasopharyngeal swab, presence of viral mutation(s) within the areas targeted by this assay, and inadequate number of viral copies(<138 copies/mL). A negative result must be combined with clinical observations, patient history, and epidemiological information. The expected result is Negative.  Fact Sheet for Patients:  EntrepreneurPulse.com.au  Fact Sheet for Healthcare Providers:  IncredibleEmployment.be  This test is no t yet approved or cleared by the [place] FDA and  has been authorized for detection and/or diagnosis of SARS-CoV-2 by FDA under an Emergency Use Authorization (EUA). This EUA will remain  in effect (meaning this test can be used) for the duration of the COVID-19 declaration under Section 564(b)(1) of the Act, 21 U.S.C.section 360bbb-3(b)(1), unless the authorization is terminated  or revoked sooner.       Influenza A by PCR NEGATIVE NEGATIVE Final   Influenza B by PCR NEGATIVE NEGATIVE Final    Comment: (NOTE) The Xpert Xpress SARS-CoV-2/FLU/RSV plus assay is intended as an aid in the diagnosis of influenza from Nasopharyngeal swab specimens and should not be used as a sole basis for treatment. Nasal washings and aspirates are unacceptable for Xpert Xpress  SARS-CoV-2/FLU/RSV testing.  Fact Sheet for Patients: EntrepreneurPulse.com.au  Fact Sheet for Healthcare Providers: IncredibleEmployment.be  This test is not yet approved or cleared by the [place] FDA and has been authorized for detection and/or diagnosis of SARS-CoV-2 by FDA under an Emergency Use Authorization (EUA). This EUA will remain in effect (meaning this test can be used) for the duration of the COVID-19 declaration under Section 564(b)(1) of the Act, 21 U.S.C. section 360bbb-3(b)(1), unless the authorization is terminated or revoked.  Performed at [facility], [address] [place], [place] [address]   MRSA Next Gen by PCR, Nasal     Status: None   Collection Time: 12/23/20  4:51 PM   Specimen: Nasal Mucosa; Nasal Swab  Result Value Ref Range Status   MRSA by PCR Next Gen NOT DETECTED NOT DETECTED Final    Comment: (NOTE) The GeneXpert MRSA Assay (FDA approved for NASAL specimens only), is one component of a comprehensive MRSA colonization surveillance program. It is not intended to diagnose MRSA infection nor to guide or monitor treatment for  MRSA infections. Test performance is not FDA approved in patients less than 66 years old. Performed at [facility], [address] [place], [place] [address]     Coagulation Studies: No results for input(s): LABPROT, INR in the last 72 hours.  Urinalysis: Recent Labs    12/22/20 1950  COLORURINE YELLOW*  LABSPEC 1.017  PHURINE 5.0  GLUCOSEU NEGATIVE  HGBUR SMALL*  BILIRUBINUR NEGATIVE  KETONESUR NEGATIVE  PROTEINUR >=300*  NITRITE NEGATIVE  LEUKOCYTESUR MODERATE*       Imaging: CT ABDOMEN PELVIS WO CONTRAST  Result Date: 12/22/2020 CLINICAL DATA:  Acute nonlocalized abdominal pain. EXAM: CT ABDOMEN AND PELVIS WITHOUT CONTRAST TECHNIQUE: Multidetector CT imaging of the abdomen and pelvis was performed following the standard protocol without IV  contrast. COMPARISON:  09/27/2014 FINDINGS: Lower chest: Moderate to large bilateral pleural effusions with basilar atelectasis. Hepatobiliary: No focal liver abnormality is seen. Status post cholecystectomy. No biliary dilatation. Pancreas: Unremarkable. No pancreatic ductal dilatation or surrounding inflammatory changes. Spleen: Normal in size without focal abnormality. Adrenals/Urinary Tract: No adrenal gland nodules. Increased density in the kidneys could represent intrarenal stones or more likely residual contrast material from prior contrast administration. No hydronephrosis or hydroureter. Bladder is decompressed. There appears to be a stone in the dependent bladder on the right. Stomach/Bowel: Stomach, small bowel, and colon are mostly decompressed. Scattered colonic diverticula without evidence of diverticulitis. Appendix is not identified. Vascular/Lymphatic: Scattered aortic calcification. No aneurysm. Scattered retroperitoneal lymph nodes without pathologic enlargement. Reproductive: Status post hysterectomy. No adnexal masses. Other: Moderate free fluid in the abdomen and pelvis, likely ascites. Diffuse edema throughout the subcutaneous fatty tissues of the abdomen, pelvis, and visualized lower chest. No free air. Musculoskeletal: Previous internal fixation of the right hip. Degenerative changes in the spine. No destructive bone lesions. IMPRESSION: 1. Moderate bilateral pleural effusions with basilar atelectasis. 2. Moderate abdominal and pelvic ascites. 3. Diffuse soft tissue edema. 4. Increased density in the kidneys may represent multiple intrarenal stones or residual contrast material. Suggestion of a stone in the bladder. 5. Colonic diverticula without evidence of diverticulitis. 6. Aortic atherosclerosis. Electronically Signed   By: Lucienne Capers M.D.   On: 12/22/2020 18:04   DG Chest 2 View  Result Date: 12/22/2020 CLINICAL DATA:  Syncopal episode. EXAM: CHEST - 2 VIEW COMPARISON:   11/07/2020 FINDINGS: Right central venous catheter with tip over the low SVC region. Shallow inspiration. Heart size and pulmonary vascularity are normal for technique. There are bilateral pleural effusions, greater on the left. No perihilar edema or infiltration. Atelectasis in the lung bases. Mediastinal contours appear intact. IMPRESSION: Bilateral pleural effusions with basilar atelectasis, greater on the left. Electronically Signed   By: Lucienne Capers M.D.   On: 12/22/2020 18:17   CT HEAD WO CONTRAST (5MM)  Result Date: 12/22/2020 CLINICAL DATA:  Cerebral hemorrhage suspected. Syncopal episode. Witnessed syncopal episode for 45 seconds during dialysis. EXAM: CT HEAD WITHOUT CONTRAST TECHNIQUE: Contiguous axial images were obtained from the base of the skull through the vertex without intravenous contrast. COMPARISON:  07/25/2020 FINDINGS: Brain: Since the previous study there has been interval operative evacuation of the large subdural hematoma previously seen. There is a small chronic subdural fluid collection demonstrated today. Mild thickening of the dura is likely postoperative. No recurrent hemorrhage is identified. Chronic atrophy. Ventricular dilatation consistent with central atrophy. Low-attenuation changes in the deep white matter consistent small vessel ischemia. Vascular: Moderate intracranial vascular calcifications. Skull: Postoperative left frontoparietal craniotomy with plate and screw fixation of the  bone flap. Sinuses/Orbits: Paranasal sinuses and mastoid air cells are clear. Other: None. IMPRESSION: 1. Postoperative changes with interval evacuation of previous large subdural hematoma. Postoperative changes and small chronic subdural collection seen today. No evidence of recurrent intracranial hemorrhage. 2. Chronic atrophy and small vessel ischemic changes. Electronically Signed   By: Lucienne Capers M.D.   On: 12/22/2020 17:59     Medications:    sodium chloride     cefTRIAXone  (ROCEPHIN)  IV Stopped (12/23/20 2147)   [START ON 12/25/2020] clindamycin (CLEOCIN) IV      aspirin  81 mg Oral Daily   atorvastatin  40 mg Oral Daily   brivaracetam  75 mg Oral BID   Chlorhexidine Gluconate Cloth  6 each Topical Q0600   lacosamide  50 mg Oral BID   melatonin  5 mg Oral QHS   metoprolol tartrate  12.5 mg Oral BID   multivitamin  1 tablet Oral QHS   pantoprazole  40 mg Oral Daily   sertraline  50 mg Oral Daily   sodium chloride flush  3 mL Intravenous Q12H   acetaminophen **OR** acetaminophen, calcium carbonate, diphenhydrAMINE, famotidine, HYDROmorphone (DILAUDID) injection, methylPREDNISolone (SOLU-MEDROL) injection, midazolam, nitroGLYCERIN, ondansetron **OR** ondansetron (ZOFRAN) IV, ondansetron (ZOFRAN) IV, senna-docusate, traZODone  Assessment/ Plan:  Amy [name] is a 70 y.o. white female with end stage renal disease on hemodialysis, hypertension, hyperlipidemia, seizure disorder, GERD, anxiety, SDH status post craniotomy who was admitted to [facility] on 12/22/2020 for Syncope and collapse [R55] Chronic hyponatremia [E87.1] Hypokalemia [E87.6] Syncope [R55] ESRD (end stage renal disease) ([place]) [N18.6] Acute cystitis without hematuria [N30.00] Leukocytosis, unspecified type [D72.829] Acute cystitis [N30.00]  [facility] M[facility] RIJ permcath 56kg  End Stage Renal Disease: Dialysis not attempted yesterday due to Permcath concerns. HD RN pulling clots and air bubbles. Vascular contacted to evaluate permcath integrity and performed a permcath exchange. Plan to dialyze this afternoon.    Hypertension: with syncopal episode. 163/95 Continue metoprolol  Anemia of chronic kidney disease: hemoglobin 12.4. No indication for ESA. Will monitor  Secondary Hyperparathyroidism: outpatient parameters at goal. Currently not on binders.   Urinary tract infection: placed on empiric ceftriaxone    LOS: 1   8/24/202210:42 AM

## 2020-12-24 NOTE — TOC Initial Note (Signed)
Transition of Care [facility]) - Initial/Assessment Note    Patient Details  Name: Amy [name] MRN: [id number] Date of Birth: 04/28/1951  [facility]) CM/SW Contact:    Candie Chroman, LCSW Phone Number: 12/24/2020, 9:18 AM  Clinical Narrative:   CSW met with patient. Husband at bedside. CSW introduced role and explained that discharge planning would be discussed. Patient confirmed she was admitted from [facility] and plan is to return at discharge. She and her husband have been paying privately. Per admissions coordinator, she finished rehab but is paying privately until she is stronger. No further concerns. CSW encouraged patient to contact CSW as needed. CSW will continue to follow patient and her husband for support and facilitate return to SNF when medically stable.               Expected Discharge Plan: Skilled Nursing Facility Barriers to Discharge: Continued Medical Work up   Patient Goals and CMS Choice     Choice offered to / list presented to : Patient, Spouse  Expected Discharge Plan and Services Expected Discharge Plan: [place] Acute Care Choice: Resumption of Svcs/PTA Provider Living arrangements for the past 2 months: [place]                                      Prior Living Arrangements/Services Living arrangements for the past 2 months: [place] Lives with:: Spouse Patient language and need for interpreter reviewed:: Yes Do you feel safe going back to the place where you live?: Yes      Need for Family Participation in Patient Care: Yes (Comment) Care giver support system in place?: Yes (comment)   Criminal Activity/Legal Involvement Pertinent to Current Situation/Hospitalization: No - Comment as needed  Activities of Daily Living Home Assistive Devices/Equipment: Hospital bed, Raised toilet seat with rails, Wheelchair, Environmental consultant (specify type) ADL Screening (condition at time of  admission) Patient's cognitive ability adequate to safely complete daily activities?: Yes Is the patient deaf or have difficulty hearing?: No Does the patient have difficulty seeing, even when wearing glasses/contacts?: No Does the patient have difficulty concentrating, remembering, or making decisions?: No Patient able to express need for assistance with ADLs?: Yes Does the patient have difficulty dressing or bathing?: Yes Independently performs ADLs?: No Communication: Independent Dressing (OT): Needs assistance Is this a change from baseline?: Pre-admission baseline Grooming: Needs assistance Is this a change from baseline?: Pre-admission baseline Feeding: Independent Bathing: Needs assistance Is this a change from baseline?: Pre-admission baseline Toileting: Dependent Is this a change from baseline?: Pre-admission baseline In/Out Bed: Dependent Is this a change from baseline?: Pre-admission baseline Walks in Home: Dependent Is this a change from baseline?: Pre-admission baseline Does the patient have difficulty walking or climbing stairs?: Yes Weakness of Legs: Both Weakness of Arms/Hands: Both  Permission Sought/Granted Permission sought to share information with : Facility Sport and exercise psychologist, Family Supports Permission granted to share information with : Yes, Verbal Permission Granted  Share Information with NAME: Lisvet Rasheed  Permission granted to share info w AGENCY: [facility]  Permission granted to share info w Relationship: Husband  Permission granted to share info w Contact Information: [phone]  Emotional Assessment Appearance:: Appears stated age Attitude/Demeanor/Rapport: Engaged, Gracious Affect (typically observed): Accepting, Appropriate, Calm, Pleasant Orientation: : Oriented to Self, Oriented to Place, Oriented to  Time, Oriented to Situation  Alcohol / Substance Use: Not Applicable Psych Involvement: No (comment)  Admission diagnosis:   Syncope and collapse [R55] Chronic hyponatremia [E87.1] Hypokalemia [E87.6] Syncope [R55] ESRD (end stage renal disease) ([place]) [N18.6] Acute cystitis without hematuria [N30.00] Leukocytosis, unspecified type [D72.829] Acute cystitis [N30.00] Patient Active Problem List   Diagnosis Date Noted   Acute cystitis 12/23/2020   Acute cystitis with hematuria    Anasarca    Syncope, vasovagal 16/02/9603   Complication associated with dialysis catheter    Pulmonary edema 11/07/2020   CAD (coronary artery disease) of artery bypass graft 09/15/2020   History of nausea and vomiting 09/15/2020   Leukocytosis    Sleep disturbance    SDH (subdural hematoma) ([place]) 08/07/2020   Acute on chronic anemia    Subdural hemorrhage following injury ([place]) 08/04/2020   Orthostasis    Gastroparesis    Chronic systolic CHF (congestive heart failure) (HCC)    Nausea    Hypoalbuminemia due to protein-calorie malnutrition (HCC)    Nausea and vomiting    Chronic combined systolic and diastolic CHF (congestive heart failure) (HCC)    Anemia of chronic disease    Seizure disorder (HCC)    Chronic pain syndrome    Debility 07/04/2020   Ischemic cardiomyopathy    Protein-calorie malnutrition, severe 06/23/2020   Non-ST elevation (NSTEMI) myocardial infarction ([place])    Pressure injury of skin 06/21/2020   C. difficile diarrhea 06/19/2020   C. difficile colitis 06/19/2020   Seizure ([place]) 06/19/2020   Gastroenteritis due to COVID-19 virus 05/29/2020   Surgery, elective    S/P ORIF (open reduction internal fixation) fracture    Closed right hip fracture, initial encounter ([place]) 04/22/2020   Hypotension 12/16/2019   ESRD on dialysis ([place]) 12/16/2019   Depression with anxiety 12/16/2019   Hypomagnesemia 12/16/2019   Atrial flutter (HCC)    Atrial fibrillation with RVR ([place]) 04/03/2019   Benign essential HTN 04/03/2019   HLD (hyperlipidemia) 04/03/2019   ESRD (end stage renal disease) ([place]) 12/13/2018    Itching 11/08/2018   Hyperparathyroidism, secondary renal ([place]) 09/13/2017   Ground glass opacity present on imaging of lung 04/08/2017   Osteoarthritis of knee 12/29/2016   Normocytic anemia 10/14/2016   Swelling of limb    Chronic headaches    Gastritis    History of adenomatous polyp of colon 09/07/2016   Chronic abdominal pain 09/05/2016   Iron deficiency anemia 09/05/2016   Hyponatremia 09/05/2016   Multiple thyroid nodules 07/04/2015   Right thyroid nodule 06/27/2015   Allergic rhinitis, seasonal 12/03/2014   Fever blister 12/03/2014   Herniated lumbar intervertebral disc 12/03/2014   Hypokalemia 12/03/2014   Recurrent sinus infections 12/03/2014   Insomnia 11/05/2014   Acquired scoliosis 06/26/2014   Intervertebral disc stenosis of neural canal of lumbar region 06/20/2014   Lumbar facet arthropathy 05/21/2014   Degenerative spondylolisthesis 03/12/2014   OP (osteoporosis) 08/10/2013   Idiopathic peripheral neuropathy 03/24/2009   Edema 12/27/2007   Headache, tension-type 10/25/2006   Primary hypertension 10/24/2006   Anxiety 05/03/1998   Esophageal reflux 05/03/1998   PCP:  Murlean Iba, MD Pharmacy:   [id number] [address], [address] [place] [address] Phone: [phone] Fax: [phone]  [id number] [place]: [phone]: [phone] [id number] [address] [address] Phone: [phone] Fax: [id number] - [address] [place] [address] Phone: [phone] Fax: [phone]  [id number] - [place], [address] [place] [address] Phone: [phone] Fax: [phone]  [facility] [address] [place] [address] Phone: [phone] Fax: [phone]     Social Determinants of Health (SDOH) Interventions    Readmission Risk Interventions Readmission Risk Prevention Plan 11/09/2020 06/24/2020  Transportation Screening Complete Complete  PCP or Specialist Appt within 3-5 Days - Complete  HRI or [place] - Complete  Social Work Consult for [place] Planning/Counseling - Complete  Palliative Care Screening - Not Applicable  Medication Review Press photographer) Complete Complete  PCP or Specialist appointment within 3-5 days of discharge Complete -  [place] or Home Care Consult Complete -  SW Recovery Care/Counseling Consult Complete -  Palliative Care Screening Not Applicable -  [place] Complete -  Some recent data might be hidden

## 2020-12-25 MED ORDER — CIPROFLOXACIN HCL 250 MG PO TABS: 250.0000 mg | ORAL_TABLET | Freq: Every day | ORAL | 0 refills | Status: AC

## 2020-12-25 MED ORDER — CIPROFLOXACIN HCL 500 MG PO TABS
250.0000 mg | ORAL_TABLET | Freq: Once | ORAL | Status: AC
  Administered 2020-12-25: 250 mg via ORAL
  Filled 2020-12-25: qty 1
  Filled 2020-12-25: qty 0.5

## 2020-12-25 NOTE — Discharge Summary (Signed)
Physician Discharge Summary  Amy [name] JJK:[id number] DOB: [birth year] DOA: 12/22/2020  PCP: Murlean Iba, MD  Admit date: 12/22/2020 Discharge date: 12/25/2020  Admitted From: SNF Disposition: SNF, [facility]  Recommendations for Outpatient Follow-up:  Follow up with PCP in 1-2 weeks Follow-up with cardiology Dr. Rockey Situ in the next 1 to 2 months  Home Health: No Equipment/Devices: None Discharge Condition: Stable CODE STATUS: DNR Diet recommendation: Heart Healthy / Carb Modified  Brief/Interim Summary: 70 year old female with end-stage renal disease on hemodialysis on Monday Wednesday Friday, CAD, subdural hematoma status postevacuation (07/25/2020), seizure disorder, hypertension, hyperlipidemia, chronic hyponatremia and anemia of chronic disease.  Presented with syncopal episode with dialysis and sent into the hospital for further evaluation and found to have acute cystitis with hematuria.  Underwent hemodialysis attempts on 8/23.  PermCath nonfunctional.  Vascular surgery consulted for catheter exchange, planned 8/24    Catheter was successfully exchanged.  Patient underwent hemodialysis on day of discharge 8/25.  Discussed with nephrology.  Cleared for discharge.  Patient will return to [facility].  I recommend on dialysis days to hold blood pressure medications to avoid hypotension and minimize risks of future syncopal events associated with HD.   Discharge Diagnoses:  Principal Problem:   Syncope, vasovagal Active Problems:   Hypokalemia   Hyponatremia   HLD (hyperlipidemia)   ESRD on dialysis [facility])   Ischemic cardiomyopathy   Chronic combined systolic and diastolic CHF (congestive heart failure) (HCC)   Seizure disorder (HCC)   Chronic systolic CHF (congestive heart failure) (HCC)   Leukocytosis   Acute cystitis  Acute cystitis associated with hematuria and severe leukocytosis Was on Macrodantin as outpatient, poor choice for dialysis patient On Rocephin  here White cell count 48 on presentation Subsequently downtrending Patient does not appear toxic Switch to Unasyn per sensitivities Will switch to ciprofloxacin at time of discharge 250 mg p.o. x2 additional doses   ESRD on hemodialysis Syncope with dialysis Nonfunctional PermCath Patient has syncopal event and outpatient HD chair No evidence of ACS Unclear nature of her syncope Suspect the syncope may have been due to transient hypotension during HD I recommend on dialysis days to hold all blood pressure medications to avoid hypotension and minimize risks of future syncopal events   Chronic systolic congestive heart failure Ischemic cardiomyopathy Last known ejection fraction 25 to 30% Patient known to [facility] Dr. Rockey Situ Case was discussed with Dr. Rockey Situ on day of discharge Indicates that low ejection fraction may have improved after drug-eluting stents were placed.  Cardiology recommends patient follow-up with clinic in the next 1 to 39months for repeat echocardiogram and further evaluation   Seizure disorder PTA Vimpat   Anemia of chronic disease Stable   Depression PTA Zoloft   History of subdural hematoma status post partial craniectomy At [facility] in March 2022 Stable Outpatient follow-up   Hyponatremia Asymptomatic Hemodialysis for management  Discharge Instructions  Discharge Instructions     Diet - low sodium heart healthy   Complete by: As directed    Increase activity slowly   Complete by: As directed    No wound care   Complete by: As directed       Allergies as of 12/25/2020       Reactions   Cephalosporins Hives, Swelling   Clarithromycin Other (See Comments)   Unknown reaction   Ensure [nutritional Supplements] Nausea And Vomiting   Gabapentin    Lunesta  [eszopiclone]    Heart racing   Nepro [compleat] Nausea And Vomiting  Penicillin V    Has patient had a PCN reaction causing immediate rash, facial/tongue/throat swelling, SOB or  lightheadedness with hypotension: Yes Has patient had a PCN reaction causing severe rash involving mucus membranes or skin necrosis: No Has patient had a PCN reaction that required hospitalization No Has patient had a PCN reaction occurring within the last 10 years: No If all of the above answers are "NO", then may proceed with Cephalosporin use.   Sulfa Antibiotics    Unknown reaction        Medication List     STOP taking these medications    aspirin 81 MG chewable tablet   nitrofurantoin (macrocrystal-monohydrate) 100 MG capsule Commonly known as: MACROBID       TAKE these medications    acetaminophen 325 MG tablet Commonly known as: TYLENOL Take 1-2 tablets (325-650 mg total) by mouth every 4 (four) hours as needed for mild pain. What changed:  when to take this reasons to take this   atorvastatin 40 MG tablet Commonly known as: LIPITOR Take 1 tablet (40 mg total) by mouth daily.   brivaracetam 25 MG Tabs tablet Commonly known as: BRIVIACT Take 3 tablets (75 mg total) by mouth 2 (two) times daily.   calcium carbonate 750 MG chewable tablet Commonly known as: TUMS EX Chew 1-2 tablets by mouth as needed for heartburn.   camphor-menthol lotion Commonly known as: SARNA Apply topically as needed for itching.   Chlorhexidine Gluconate Cloth 2 % Pads Apply 6 each topically daily.   ciprofloxacin 250 MG tablet Commonly known as: Cipro Take 1 tablet (250 mg total) by mouth daily for 2 days. Start taking on: December 26, 2020   Darbepoetin Alfa 60 MCG/0.3ML Sosy injection Commonly known as: ARANESP Inject 0.3 mLs (60 mcg total) into the vein every Saturday with hemodialysis.   diphenhydrAMINE 25 mg capsule Commonly known as: BENADRYL Take 25-50 mg by mouth every 6 (six) hours as needed for itching or allergies.   docusate sodium 100 MG capsule Commonly known as: COLACE Take 1 capsule (100 mg total) by mouth 2 (two) times daily.   Kaopectate 262 MG/15ML  suspension Generic drug: bismuth subsalicylate Take 30 mLs by mouth as needed for diarrhea or loose stools.   lacosamide 50 MG Tabs tablet Commonly known as: VIMPAT Take 1 tablet (50 mg total) by mouth 2 (two) times daily.   loperamide 2 MG capsule Commonly known as: IMODIUM Take 2 mg by mouth as needed for diarrhea or loose stools. May repeat after each loose stool X 2.   melatonin 5 MG Tabs Take 1 tablet (5 mg total) by mouth at bedtime.   methocarbamol 500 MG tablet Commonly known as: ROBAXIN Take 1 tablet (500 mg total) by mouth every 6 (six) hours as needed for muscle spasms.   metoprolol tartrate 25 MG tablet Commonly known as: LOPRESSOR Take 12.5 mg by mouth 2 (two) times daily. What changed: Another medication with the same name was removed. Continue taking this medication, and follow the directions you see here.   multivitamin Tabs tablet Take 1 tablet by mouth at bedtime.   nitroGLYCERIN 0.4 MG SL tablet Commonly known as: NITROSTAT Place 0.4 mg under the tongue every 5 (five) minutes as needed for chest pain. X 2 more doses.   ondansetron 4 MG tablet Commonly known as: ZOFRAN Take 4 mg by mouth every 8 (eight) hours as needed.   pantoprazole 40 MG tablet Commonly known as: PROTONIX TAKE 1 TABLET (40 MG TOTAL)  BY MOUTH DAILY.   sertraline 50 MG tablet Commonly known as: ZOLOFT Take 50 mg by mouth daily.   traZODone 50 MG tablet Commonly known as: DESYREL Take 0.5-1 tablets (25-50 mg total) by mouth at bedtime as needed for sleep. What changed: how much to take   triamcinolone cream 0.1 % Commonly known as: KENALOG Apply topically 3 (three) times daily.        Contact information for after-discharge care     Destination     [facility] .   Service: Skilled Nursing Contact information: [place] [address] [phone]                    Allergies  Allergen Reactions    Cephalosporins Hives and Swelling   Clarithromycin Other (See Comments)    Unknown reaction   Ensure [Nutritional Supplements] Nausea And Vomiting   Gabapentin    Lunesta  [Eszopiclone]     Heart racing   Nepro [Compleat] Nausea And Vomiting   Penicillin V     Has patient had a PCN reaction causing immediate rash, facial/tongue/throat swelling, SOB or lightheadedness with hypotension: Yes Has patient had a PCN reaction causing severe rash involving mucus membranes or skin necrosis: No Has patient had a PCN reaction that required hospitalization No Has patient had a PCN reaction occurring within the last 10 years: No If all of the above answers are "NO", then may proceed with Cephalosporin use.   Sulfa Antibiotics     Unknown reaction    Consultations: Nephrology Vascular   Procedures/Studies: CT ABDOMEN PELVIS WO CONTRAST  Result Date: 12/22/2020 CLINICAL DATA:  Acute nonlocalized abdominal pain. EXAM: CT ABDOMEN AND PELVIS WITHOUT CONTRAST TECHNIQUE: Multidetector CT imaging of the abdomen and pelvis was performed following the standard protocol without IV contrast. COMPARISON:  09/27/2014 FINDINGS: Lower chest: Moderate to large bilateral pleural effusions with basilar atelectasis. Hepatobiliary: No focal liver abnormality is seen. Status post cholecystectomy. No biliary dilatation. Pancreas: Unremarkable. No pancreatic ductal dilatation or surrounding inflammatory changes. Spleen: Normal in size without focal abnormality. Adrenals/Urinary Tract: No adrenal gland nodules. Increased density in the kidneys could represent intrarenal stones or more likely residual contrast material from prior contrast administration. No hydronephrosis or hydroureter. Bladder is decompressed. There appears to be a stone in the dependent bladder on the right. Stomach/Bowel: Stomach, small bowel, and colon are mostly decompressed. Scattered colonic diverticula without evidence of diverticulitis. Appendix is not  identified. Vascular/Lymphatic: Scattered aortic calcification. No aneurysm. Scattered retroperitoneal lymph nodes without pathologic enlargement. Reproductive: Status post hysterectomy. No adnexal masses. Other: Moderate free fluid in the abdomen and pelvis, likely ascites. Diffuse edema throughout the subcutaneous fatty tissues of the abdomen, pelvis, and visualized lower chest. No free air. Musculoskeletal: Previous internal fixation of the right hip. Degenerative changes in the spine. No destructive bone lesions. IMPRESSION: 1. Moderate bilateral pleural effusions with basilar atelectasis. 2. Moderate abdominal and pelvic ascites. 3. Diffuse soft tissue edema. 4. Increased density in the kidneys may represent multiple intrarenal stones or residual contrast material. Suggestion of a stone in the bladder. 5. Colonic diverticula without evidence of diverticulitis. 6. Aortic atherosclerosis. Electronically Signed   By: Lucienne Capers M.D.   On: 12/22/2020 18:04   DG Chest 2 View  Result Date: 12/22/2020 CLINICAL DATA:  Syncopal episode. EXAM: CHEST - 2 VIEW COMPARISON:  11/07/2020 FINDINGS: Right central venous catheter with tip over the low SVC region. Shallow inspiration. Heart size and pulmonary  vascularity are normal for technique. There are bilateral pleural effusions, greater on the left. No perihilar edema or infiltration. Atelectasis in the lung bases. Mediastinal contours appear intact. IMPRESSION: Bilateral pleural effusions with basilar atelectasis, greater on the left. Electronically Signed   By: Lucienne Capers M.D.   On: 12/22/2020 18:17   CT HEAD WO CONTRAST (5MM)  Result Date: 12/22/2020 CLINICAL DATA:  Cerebral hemorrhage suspected. Syncopal episode. Witnessed syncopal episode for 45 seconds during dialysis. EXAM: CT HEAD WITHOUT CONTRAST TECHNIQUE: Contiguous axial images were obtained from the base of the skull through the vertex without intravenous contrast. COMPARISON:  07/25/2020  FINDINGS: Brain: Since the previous study there has been interval operative evacuation of the large subdural hematoma previously seen. There is a small chronic subdural fluid collection demonstrated today. Mild thickening of the dura is likely postoperative. No recurrent hemorrhage is identified. Chronic atrophy. Ventricular dilatation consistent with central atrophy. Low-attenuation changes in the deep white matter consistent small vessel ischemia. Vascular: Moderate intracranial vascular calcifications. Skull: Postoperative left frontoparietal craniotomy with plate and screw fixation of the bone flap. Sinuses/Orbits: Paranasal sinuses and mastoid air cells are clear. Other: None. IMPRESSION: 1. Postoperative changes with interval evacuation of previous large subdural hematoma. Postoperative changes and small chronic subdural collection seen today. No evidence of recurrent intracranial hemorrhage. 2. Chronic atrophy and small vessel ischemic changes. Electronically Signed   By: Lucienne Capers M.D.   On: 12/22/2020 17:59   PERIPHERAL VASCULAR CATHETERIZATION  Result Date: 12/24/2020 See surgical note for result.  (Echo, Carotid, EGD, Colonoscopy, ERCP)    Subjective: Seen and examined on the day of discharge.  Stable no distress.  Discharge plan explained at length to patient patient's husband and daughter at bedside  Discharge Exam: Vitals:   12/25/20 1100 12/25/20 1115  BP: (!) 143/87 (!) 147/134  Pulse:    Resp: (!) 24 17  Temp:    SpO2:     Vitals:   12/25/20 1016 12/25/20 1030 12/25/20 1100 12/25/20 1115  BP: (!) 168/81 (!) 161/79 (!) 143/87 (!) 147/134  Pulse: 81     Resp: (!) 27 (!) 23 (!) 24 17  Temp: 99 F (37.2 C)     TempSrc: Oral     SpO2:      Weight:      Height:        General: Pt is alert, awake, not in acute distress Cardiovascular: RRR, S1/S2 +, no rubs, no gallops Respiratory: CTA bilaterally, no wheezing, no rhonchi Abdominal: Soft, NT, ND, bowel sounds  + Extremities: no edema, no cyanosis    The results of significant diagnostics from this hospitalization (including imaging, microbiology, ancillary and laboratory) are listed below for reference.     Microbiology: Recent Results (from the past 240 hour(s))  Urine Culture     Status: Abnormal (Preliminary result)   Collection Time: 12/22/20  7:50 PM   Specimen: Urine, Clean Catch  Result Value Ref Range Status   Specimen Description URINE, CLEAN CATCH  Final   Special Requests NONE  Final   Culture (A)  Final    >=100,000 COLONIES/mL CITROBACTER SPECIES >=100,000 COLONIES/mL KLEBSIELLA OXYTOCA 60,000 COLONIES/mL ENTEROCOCCUS FAECALIS    Report Status PENDING  Incomplete   Organism ID, Bacteria KLEBSIELLA OXYTOCA (A)  Final   Organism ID, Bacteria CITROBACTER SPECIES (A)  Final      Susceptibility   Citrobacter species - MIC*    CEFAZOLIN >=64 RESISTANT Resistant     CEFEPIME <=0.12 SENSITIVE Sensitive  CEFTRIAXONE <=0.25 SENSITIVE Sensitive     CIPROFLOXACIN <=0.25 SENSITIVE Sensitive     GENTAMICIN <=1 SENSITIVE Sensitive     IMIPENEM 1 SENSITIVE Sensitive     NITROFURANTOIN <=16 SENSITIVE Sensitive     TRIMETH/SULFA <=20 SENSITIVE Sensitive     PIP/TAZO <=4 SENSITIVE Sensitive     * >=100,000 COLONIES/mL CITROBACTER SPECIES   Klebsiella oxytoca - MIC*    AMPICILLIN RESISTANT Resistant     CEFAZOLIN <=4 SENSITIVE Sensitive     CEFEPIME <=0.12 SENSITIVE Sensitive     CEFTRIAXONE <=0.25 SENSITIVE Sensitive     CIPROFLOXACIN <=0.25 SENSITIVE Sensitive     GENTAMICIN <=1 SENSITIVE Sensitive     IMIPENEM <=0.25 SENSITIVE Sensitive     NITROFURANTOIN <=16 SENSITIVE Sensitive     TRIMETH/SULFA <=20 SENSITIVE Sensitive     AMPICILLIN/SULBACTAM 8 SENSITIVE Sensitive     PIP/TAZO Value in next row Sensitive      <=4 SENSITIVEPerformed at [facility], [address] [place], [place] [address]    * >=100,000 COLONIES/mL KLEBSIELLA OXYTOCA  Blood culture (single)      Status: None (Preliminary result)   Collection Time: 12/22/20  8:55 PM   Specimen: BLOOD  Result Value Ref Range Status   Specimen Description BLOOD BLOOD RIGHT WRIST  Final   Special Requests   Final    BOTTLES DRAWN AEROBIC AND ANAEROBIC Blood Culture results may not be optimal due to an inadequate volume of blood received in culture bottles   Culture   Final    NO GROWTH 3 DAYS Performed at [facility], [address], [place], [place] [address]    Report Status PENDING  Incomplete  Resp Panel by RT-PCR (Flu A&B, Covid) Nasopharyngeal Swab     Status: None   Collection Time: 12/23/20  8:50 AM   Specimen: Nasopharyngeal Swab; Nasopharyngeal(NP) swabs in vial transport medium  Result Value Ref Range Status   SARS Coronavirus 2 by RT PCR NEGATIVE NEGATIVE Final    Comment: (NOTE) SARS-CoV-2 target nucleic acids are NOT DETECTED.  The SARS-CoV-2 RNA is generally detectable in upper respiratory specimens during the acute phase of infection. The lowest concentration of SARS-CoV-2 viral copies this assay can detect is 138 copies/mL. A negative result does not preclude SARS-Cov-2 infection and should not be used as the sole basis for treatment or other patient management decisions. A negative result may occur with  improper specimen collection/handling, submission of specimen other than nasopharyngeal swab, presence of viral mutation(s) within the areas targeted by this assay, and inadequate number of viral copies(<138 copies/mL). A negative result must be combined with clinical observations, patient history, and epidemiological information. The expected result is Negative.  Fact Sheet for Patients:  EntrepreneurPulse.com.au  Fact Sheet for Healthcare Providers:  IncredibleEmployment.be  This test is no t yet approved or cleared by the [place] FDA and  has been authorized for detection and/or diagnosis of SARS-CoV-2 by FDA under  an Emergency Use Authorization (EUA). This EUA will remain  in effect (meaning this test can be used) for the duration of the COVID-19 declaration under Section 564(b)(1) of the Act, 21 U.S.C.section 360bbb-3(b)(1), unless the authorization is terminated  or revoked sooner.       Influenza A by PCR NEGATIVE NEGATIVE Final   Influenza B by PCR NEGATIVE NEGATIVE Final    Comment: (NOTE) The Xpert Xpress SARS-CoV-2/FLU/RSV plus assay is intended as an aid in the diagnosis of influenza from Nasopharyngeal swab specimens and should not  be used as a sole basis for treatment. Nasal washings and aspirates are unacceptable for Xpert Xpress SARS-CoV-2/FLU/RSV testing.  Fact Sheet for Patients: EntrepreneurPulse.com.au  Fact Sheet for Healthcare Providers: IncredibleEmployment.be  This test is not yet approved or cleared by the [place] FDA and has been authorized for detection and/or diagnosis of SARS-CoV-2 by FDA under an Emergency Use Authorization (EUA). This EUA will remain in effect (meaning this test can be used) for the duration of the COVID-19 declaration under Section 564(b)(1) of the Act, 21 U.S.C. section 360bbb-3(b)(1), unless the authorization is terminated or revoked.  Performed at [facility], [address] [place], [place] [address]   MRSA Next Gen by PCR, Nasal     Status: None   Collection Time: 12/23/20  4:51 PM   Specimen: Nasal Mucosa; Nasal Swab  Result Value Ref Range Status   MRSA by PCR Next Gen NOT DETECTED NOT DETECTED Final    Comment: (NOTE) The GeneXpert MRSA Assay (FDA approved for NASAL specimens only), is one component of a comprehensive MRSA colonization surveillance program. It is not intended to diagnose MRSA infection nor to guide or monitor treatment for MRSA infections. Test performance is not FDA approved in patients less than 48 years old. Performed at [facility], [address] [place], [place] [address]      Labs: BNP (last 3 results) Recent Labs    07/25/20 0033 11/07/20 1415 12/22/20 1223  BNP 116.4* 129.5* 637.8*   Basic Metabolic Panel: Recent Labs  Lab 12/22/20 1223 12/23/20 0615 12/24/20 0428  NA 130* 131* 127*  K 3.0* 4.1 3.1*  CL 96* 96* 95*  CO2 23 24 24   GLUCOSE 89 76 75  BUN 14 17 20   CREATININE 2.79* 3.17* 3.34*  CALCIUM 8.6* 8.5* 8.0*  MG 1.7  --   --    Liver Function Tests: Recent Labs  Lab 12/22/20 1223  AST 23  ALT 15  ALKPHOS 176*  BILITOT 0.7  PROT 4.9*  ALBUMIN 2.1*   No results for input(s): LIPASE, AMYLASE in the last 168 hours. No results for input(s): AMMONIA in the last 168 hours. CBC: Recent Labs  Lab 12/22/20 1223 12/23/20 0615 12/24/20 1253  WBC 47.8*  48.0* 22.5* 14.7*  NEUTROABS 44.4*  --  12.9*  HGB 13.1  13.1 12.4 12.9  HCT 42.3  41.9 40.6 41.4  MCV 89.8  88.8 88.5 88.8  PLT 292  287 259 251   Cardiac Enzymes: No results for input(s): CKTOTAL, CKMB, CKMBINDEX, TROPONINI in the last 168 hours. BNP: Invalid input(s): POCBNP CBG: No results for input(s): GLUCAP in the last 168 hours. D-Dimer No results for input(s): DDIMER in the last 72 hours. Hgb A1c No results for input(s): HGBA1C in the last 72 hours. Lipid Profile No results for input(s): CHOL, HDL, LDLCALC, TRIG, CHOLHDL, LDLDIRECT in the last 72 hours. Thyroid function studies No results for input(s): TSH, T4TOTAL, T3FREE, THYROIDAB in the last 72 hours.  Invalid input(s): FREET3 Anemia work up No results for input(s): VITAMINB12, FOLATE, FERRITIN, TIBC, IRON, RETICCTPCT in the last 72 hours. Urinalysis    Component Value Date/Time   COLORURINE YELLOW (A) 12/22/2020 1950   APPEARANCEUR TURBID (A) 12/22/2020 1950   LABSPEC 1.017 12/22/2020 1950   PHURINE 5.0 12/22/2020 1950   GLUCOSEU NEGATIVE 12/22/2020 1950   HGBUR SMALL (A) 12/22/2020 1950   BILIRUBINUR NEGATIVE 12/22/2020 1950   BILIRUBINUR negative  08/31/2018 [place] NEGATIVE 12/22/2020 1950  PROTEINUR >=300 (A) 12/22/2020 1950   UROBILINOGEN 0.2 08/31/2018 1132   NITRITE NEGATIVE 12/22/2020 1950   LEUKOCYTESUR MODERATE (A) 12/22/2020 1950   Sepsis Labs Invalid input(s): PROCALCITONIN,  WBC,  LACTICIDVEN Microbiology Recent Results (from the past 240 hour(s))  Urine Culture     Status: Abnormal (Preliminary result)   Collection Time: 12/22/20  7:50 PM   Specimen: Urine, Clean Catch  Result Value Ref Range Status   Specimen Description URINE, CLEAN CATCH  Final   Special Requests NONE  Final   Culture (A)  Final    >=100,000 COLONIES/mL CITROBACTER SPECIES >=100,000 COLONIES/mL KLEBSIELLA OXYTOCA 60,000 COLONIES/mL ENTEROCOCCUS FAECALIS    Report Status PENDING  Incomplete   Organism ID, Bacteria KLEBSIELLA OXYTOCA (A)  Final   Organism ID, Bacteria CITROBACTER SPECIES (A)  Final      Susceptibility   Citrobacter species - MIC*    CEFAZOLIN >=64 RESISTANT Resistant     CEFEPIME <=0.12 SENSITIVE Sensitive     CEFTRIAXONE <=0.25 SENSITIVE Sensitive     CIPROFLOXACIN <=0.25 SENSITIVE Sensitive     GENTAMICIN <=1 SENSITIVE Sensitive     IMIPENEM 1 SENSITIVE Sensitive     NITROFURANTOIN <=16 SENSITIVE Sensitive     TRIMETH/SULFA <=20 SENSITIVE Sensitive     PIP/TAZO <=4 SENSITIVE Sensitive     * >=100,000 COLONIES/mL CITROBACTER SPECIES   Klebsiella oxytoca - MIC*    AMPICILLIN RESISTANT Resistant     CEFAZOLIN <=4 SENSITIVE Sensitive     CEFEPIME <=0.12 SENSITIVE Sensitive     CEFTRIAXONE <=0.25 SENSITIVE Sensitive     CIPROFLOXACIN <=0.25 SENSITIVE Sensitive     GENTAMICIN <=1 SENSITIVE Sensitive     IMIPENEM <=0.25 SENSITIVE Sensitive     NITROFURANTOIN <=16 SENSITIVE Sensitive     TRIMETH/SULFA <=20 SENSITIVE Sensitive     AMPICILLIN/SULBACTAM 8 SENSITIVE Sensitive     PIP/TAZO Value in next row Sensitive      <=4 SENSITIVEPerformed at [facility], [address], [place], [place] [address]    *  >=100,000 COLONIES/mL KLEBSIELLA OXYTOCA  Blood culture (single)     Status: None (Preliminary result)   Collection Time: 12/22/20  8:55 PM   Specimen: BLOOD  Result Value Ref Range Status   Specimen Description BLOOD BLOOD RIGHT WRIST  Final   Special Requests   Final    BOTTLES DRAWN AEROBIC AND ANAEROBIC Blood Culture results may not be optimal due to an inadequate volume of blood received in culture bottles   Culture   Final    NO GROWTH 3 DAYS Performed at [facility], [address], [place], [place] [address]    Report Status PENDING  Incomplete  Resp Panel by RT-PCR (Flu A&B, Covid) Nasopharyngeal Swab     Status: None   Collection Time: 12/23/20  8:50 AM   Specimen: Nasopharyngeal Swab; Nasopharyngeal(NP) swabs in vial transport medium  Result Value Ref Range Status   SARS Coronavirus 2 by RT PCR NEGATIVE NEGATIVE Final    Comment: (NOTE) SARS-CoV-2 target nucleic acids are NOT DETECTED.  The SARS-CoV-2 RNA is generally detectable in upper respiratory specimens during the acute phase of infection. The lowest concentration of SARS-CoV-2 viral copies this assay can detect is 138 copies/mL. A negative result does not preclude SARS-Cov-2 infection and should not be used as the sole basis for treatment or other patient management decisions. A negative result may occur with  improper specimen collection/handling, submission of specimen other than nasopharyngeal swab, presence of viral mutation(s) within  the areas targeted by this assay, and inadequate number of viral copies(<138 copies/mL). A negative result must be combined with clinical observations, patient history, and epidemiological information. The expected result is Negative.  Fact Sheet for Patients:  EntrepreneurPulse.com.au  Fact Sheet for Healthcare Providers:  IncredibleEmployment.be  This test is no t yet approved or cleared by the [place] FDA and  has been  authorized for detection and/or diagnosis of SARS-CoV-2 by FDA under an Emergency Use Authorization (EUA). This EUA will remain  in effect (meaning this test can be used) for the duration of the COVID-19 declaration under Section 564(b)(1) of the Act, 21 U.S.C.section 360bbb-3(b)(1), unless the authorization is terminated  or revoked sooner.       Influenza A by PCR NEGATIVE NEGATIVE Final   Influenza B by PCR NEGATIVE NEGATIVE Final    Comment: (NOTE) The Xpert Xpress SARS-CoV-2/FLU/RSV plus assay is intended as an aid in the diagnosis of influenza from Nasopharyngeal swab specimens and should not be used as a sole basis for treatment. Nasal washings and aspirates are unacceptable for Xpert Xpress SARS-CoV-2/FLU/RSV testing.  Fact Sheet for Patients: EntrepreneurPulse.com.au  Fact Sheet for Healthcare Providers: IncredibleEmployment.be  This test is not yet approved or cleared by the [place] FDA and has been authorized for detection and/or diagnosis of SARS-CoV-2 by FDA under an Emergency Use Authorization (EUA). This EUA will remain in effect (meaning this test can be used) for the duration of the COVID-19 declaration under Section 564(b)(1) of the Act, 21 U.S.C. section 360bbb-3(b)(1), unless the authorization is terminated or revoked.  Performed at [facility], [address] [place], [place] [address]   MRSA Next Gen by PCR, Nasal     Status: None   Collection Time: 12/23/20  4:51 PM   Specimen: Nasal Mucosa; Nasal Swab  Result Value Ref Range Status   MRSA by PCR Next Gen NOT DETECTED NOT DETECTED Final    Comment: (NOTE) The GeneXpert MRSA Assay (FDA approved for NASAL specimens only), is one component of a comprehensive MRSA colonization surveillance program. It is not intended to diagnose MRSA infection nor to guide or monitor treatment for MRSA infections. Test performance is not FDA approved in patients less  than 20 years old. Performed at [facility], [address], [place], [place] [address]      Time coordinating discharge: Over 30 minutes  SIGNED:   Sidney Ace, MD  [facility] 12/25/2020, 11:38 AM Pager   If 7PM-7AM, please contact night-coverage

## 2020-12-25 NOTE — Progress Notes (Signed)
[facility]  ROUNDING NOTE   Subjective:   [name] was admitted to [facility] on 12/22/2020 for Syncope and collapse [R55] Chronic hyponatremia [E87.1] Hypokalemia [E87.6] Syncope [R55] ESRD (end stage renal disease) ([place]) [N18.6] Acute cystitis without hematuria [N30.00] Leukocytosis, unspecified type [D72.829] Acute cystitis [N30.00]  Last hemodialysis treatment was yesterday. She received 35 minutes of treatment and then had a syncopal episode. She had 46mL of fluid removed.   Patient seen during dialysis   HEMODIALYSIS FLOWSHEET:  Blood Flow Rate (mL/min): 400 mL/min Arterial Pressure (mmHg): -170 mmHg Venous Pressure (mmHg): 120 mmHg Transmembrane Pressure (mmHg): 0 mmHg Ultrafiltration Rate (mL/min): 1000 mL/min Dialysate Flow Rate (mL/min): 500 ml/min Conductivity: Machine : 13.8 Conductivity: Machine : 13.8 Dialysis Fluid Bolus: Normal Saline Bolus Amount (mL): 250 mL  No complaints at this time Permcath exchanged yesterday via Vascular Dialysis today via permcath, tolerating well   Objective:  Vital signs in last 24 hours:  Temp:  [97.5 F (36.4 C)-99 F (37.2 C)] 99 F (37.2 C) (08/25 1016) Pulse Rate:  [69-86] 81 (08/25 1016) Resp:  [9-27] 14 (08/25 1245) BP: (101-177)/(66-134) 122/88 (08/25 1245) SpO2:  [96 %-99 %] 96 % (08/25 0752)  Weight change: 0 kg Filed Weights   12/23/20 1455 12/24/20 1101  Weight: 52.5 kg 52.5 kg    Intake/Output: I/O last 3 completed shifts: In: 440 [P.O.:240; IV Piggyback:200] Out: 1001 [Other:1001]   Intake/Output this shift:  No intake/output data recorded.  Physical Exam: General: NAD, resting in bed  Head: Normocephalic, atraumatic. Moist oral mucosal membranes  Eyes: Anicteric  Lungs:  Clear to auscultation, normal effort  Heart: Regular rate and rhythm  Abdomen:  Soft, nontender  Extremities:  + peripheral edema.  Neurologic: Nonfocal, moving all four extremities  Skin: No lesions   Access: RIJ permcath exchanged on 47/65/46    Basic Metabolic Panel: Recent Labs  Lab 12/22/20 1223 12/23/20 0615 12/24/20 0428  NA 130* 131* 127*  K 3.0* 4.1 3.1*  CL 96* 96* 95*  CO2 23 24 24   GLUCOSE 89 76 75  BUN 14 17 20   CREATININE 2.79* 3.17* 3.34*  CALCIUM 8.6* 8.5* 8.0*  MG 1.7  --   --      Liver Function Tests: Recent Labs  Lab 12/22/20 1223  AST 23  ALT 15  ALKPHOS 176*  BILITOT 0.7  PROT 4.9*  ALBUMIN 2.1*    No results for input(s): LIPASE, AMYLASE in the last 168 hours. No results for input(s): AMMONIA in the last 168 hours.  CBC: Recent Labs  Lab 12/22/20 1223 12/23/20 0615 12/24/20 1253  WBC 47.8*  48.0* 22.5* 14.7*  NEUTROABS 44.4*  --  12.9*  HGB 13.1  13.1 12.4 12.9  HCT 42.3  41.9 40.6 41.4  MCV 89.8  88.8 88.5 88.8  PLT 292  287 259 251     Cardiac Enzymes: No results for input(s): CKTOTAL, CKMB, CKMBINDEX, TROPONINI in the last 168 hours.  BNP: Invalid input(s): POCBNP  CBG: No results for input(s): GLUCAP in the last 168 hours.  Microbiology: Results for orders placed or performed during the hospital encounter of 12/22/20  Urine Culture     Status: Abnormal (Preliminary result)   Collection Time: 12/22/20  7:50 PM   Specimen: Urine, Clean Catch  Result Value Ref Range Status   Specimen Description URINE, CLEAN CATCH  Final   Special Requests NONE  Final   Culture (A)  Final    >=100,000 COLONIES/mL CITROBACTER SPECIES >=  100,000 COLONIES/mL KLEBSIELLA OXYTOCA 60,000 COLONIES/mL ENTEROCOCCUS FAECALIS    Report Status PENDING  Incomplete   Organism ID, Bacteria KLEBSIELLA OXYTOCA (A)  Final   Organism ID, Bacteria CITROBACTER SPECIES (A)  Final      Susceptibility   Citrobacter species - MIC*    CEFAZOLIN >=64 RESISTANT Resistant     CEFEPIME <=0.12 SENSITIVE Sensitive     CEFTRIAXONE <=0.25 SENSITIVE Sensitive     CIPROFLOXACIN <=0.25 SENSITIVE Sensitive     GENTAMICIN <=1 SENSITIVE Sensitive     IMIPENEM  1 SENSITIVE Sensitive     NITROFURANTOIN <=16 SENSITIVE Sensitive     TRIMETH/SULFA <=20 SENSITIVE Sensitive     PIP/TAZO <=4 SENSITIVE Sensitive     * >=100,000 COLONIES/mL CITROBACTER SPECIES   Klebsiella oxytoca - MIC*    AMPICILLIN RESISTANT Resistant     CEFAZOLIN <=4 SENSITIVE Sensitive     CEFEPIME <=0.12 SENSITIVE Sensitive     CEFTRIAXONE <=0.25 SENSITIVE Sensitive     CIPROFLOXACIN <=0.25 SENSITIVE Sensitive     GENTAMICIN <=1 SENSITIVE Sensitive     IMIPENEM <=0.25 SENSITIVE Sensitive     NITROFURANTOIN <=16 SENSITIVE Sensitive     TRIMETH/SULFA <=20 SENSITIVE Sensitive     AMPICILLIN/SULBACTAM 8 SENSITIVE Sensitive     PIP/TAZO Value in next row Sensitive      <=4 SENSITIVEPerformed at [facility], [address] [place], [place] [address]    * >=100,000 COLONIES/mL KLEBSIELLA OXYTOCA  Blood culture (single)     Status: None (Preliminary result)   Collection Time: 12/22/20  8:55 PM   Specimen: BLOOD  Result Value Ref Range Status   Specimen Description BLOOD BLOOD RIGHT WRIST  Final   Special Requests   Final    BOTTLES DRAWN AEROBIC AND ANAEROBIC Blood Culture results may not be optimal due to an inadequate volume of blood received in culture bottles   Culture   Final    NO GROWTH 3 DAYS Performed at [facility], [address], [place],  [address]    Report Status PENDING  Incomplete  Resp Panel by RT-PCR (Flu A&B, Covid) Nasopharyngeal Swab     Status: None   Collection Time: 12/23/20  8:50 AM   Specimen: Nasopharyngeal Swab; Nasopharyngeal(NP) swabs in vial transport medium  Result Value Ref Range Status   SARS Coronavirus 2 by RT PCR NEGATIVE NEGATIVE Final    Comment: (NOTE) SARS-CoV-2 target nucleic acids are NOT DETECTED.  The SARS-CoV-2 RNA is generally detectable in upper respiratory specimens during the acute phase of infection. The lowest concentration of SARS-CoV-2 viral copies this assay can detect is 138 copies/mL. A  negative result does not preclude SARS-Cov-2 infection and should not be used as the sole basis for treatment or other patient management decisions. A negative result may occur with  improper specimen collection/handling, submission of specimen other than nasopharyngeal swab, presence of viral mutation(s) within the areas targeted by this assay, and inadequate number of viral copies(<138 copies/mL). A negative result must be combined with clinical observations, patient history, and epidemiological information. The expected result is Negative.  Fact Sheet for Patients:  EntrepreneurPulse.com.au  Fact Sheet for Healthcare Providers:  IncredibleEmployment.be  This test is no t yet approved or cleared by the [place] FDA and  has been authorized for detection and/or diagnosis of SARS-CoV-2 by FDA under an Emergency Use Authorization (EUA). This EUA will remain  in effect (meaning this test can be used) for the duration of the COVID-19 declaration under  Section 564(b)(1) of the Act, 21 U.S.C.section 360bbb-3(b)(1), unless the authorization is terminated  or revoked sooner.       Influenza A by PCR NEGATIVE NEGATIVE Final   Influenza B by PCR NEGATIVE NEGATIVE Final    Comment: (NOTE) The Xpert Xpress SARS-CoV-2/FLU/RSV plus assay is intended as an aid in the diagnosis of influenza from Nasopharyngeal swab specimens and should not be used as a sole basis for treatment. Nasal washings and aspirates are unacceptable for Xpert Xpress SARS-CoV-2/FLU/RSV testing.  Fact Sheet for Patients: EntrepreneurPulse.com.au  Fact Sheet for Healthcare Providers: IncredibleEmployment.be  This test is not yet approved or cleared by the [place] FDA and has been authorized for detection and/or diagnosis of SARS-CoV-2 by FDA under an Emergency Use Authorization (EUA). This EUA will remain in effect (meaning this test can  be used) for the duration of the COVID-19 declaration under Section 564(b)(1) of the Act, 21 U.S.C. section 360bbb-3(b)(1), unless the authorization is terminated or revoked.  Performed at [facility], [address] [place], [place] [address]   MRSA Next Gen by PCR, Nasal     Status: None   Collection Time: 12/23/20  4:51 PM   Specimen: Nasal Mucosa; Nasal Swab  Result Value Ref Range Status   MRSA by PCR Next Gen NOT DETECTED NOT DETECTED Final    Comment: (NOTE) The GeneXpert MRSA Assay (FDA approved for NASAL specimens only), is one component of a comprehensive MRSA colonization surveillance program. It is not intended to diagnose MRSA infection nor to guide or monitor treatment for MRSA infections. Test performance is not FDA approved in patients less than 13 years old. Performed at [facility], [address] [place], [place] [address]     Coagulation Studies: No results for input(s): LABPROT, INR in the last 72 hours.  Urinalysis: Recent Labs    12/22/20 1950  COLORURINE YELLOW*  LABSPEC 1.017  PHURINE 5.0  GLUCOSEU NEGATIVE  HGBUR SMALL*  BILIRUBINUR NEGATIVE  KETONESUR NEGATIVE  PROTEINUR >=300*  NITRITE NEGATIVE  LEUKOCYTESUR MODERATE*       Imaging: PERIPHERAL VASCULAR CATHETERIZATION  Result Date: 12/24/2020 See surgical note for result.    Medications:      aspirin  81 mg Oral Daily   atorvastatin  40 mg Oral Daily   brivaracetam  75 mg Oral BID   Chlorhexidine Gluconate Cloth  6 each Topical Q0600   ciprofloxacin  250 mg Oral Once   lacosamide  50 mg Oral BID   melatonin  5 mg Oral QHS   metoprolol tartrate  12.5 mg Oral BID   multivitamin  1 tablet Oral QHS   pantoprazole  40 mg Oral Daily   sertraline  50 mg Oral Daily   sodium chloride flush  3 mL Intravenous Q12H   acetaminophen **OR** acetaminophen, calcium carbonate, nitroGLYCERIN, ondansetron **OR** ondansetron (ZOFRAN) IV, ondansetron (ZOFRAN) IV,  senna-docusate, traZODone  Assessment/ Plan:  [name] is a 70 y.o. white female with end stage renal disease on hemodialysis, hypertension, hyperlipidemia, seizure disorder, GERD, anxiety, SDH status post craniotomy who was admitted to [facility] on 12/22/2020 for Syncope and collapse [R55] Chronic hyponatremia [E87.1] Hypokalemia [E87.6] Syncope [R55] ESRD (end stage renal disease) ([place]) [N18.6] Acute cystitis without hematuria [N30.00] Leukocytosis, unspecified type [D72.829] Acute cystitis [N30.00]  [facility] RIJ permcath 56kg  End Stage Renal Disease: Received Permcath exchange yesterday by Vascular. Received dialysis today, catheter ran well, no concerns. UF 1L removed. Next treatment scheduled for  Friday, if remains inpatient.   Hypertension: with syncopal episode. 122/88 Continue metoprolol  Anemia of chronic kidney disease: hemoglobin 12.9. No indication for ESA. Will monitor  Secondary Hyperparathyroidism: outpatient parameters at goal. Currently not on binders.   Urinary tract infection: placed on empiric ceftriaxone    LOS: 2 [place] 8/25/20221:24 PM

## 2020-12-25 NOTE — Progress Notes (Signed)
Spoke with Taneka in central telemetry at [phone] to transfer patient from room 207 to [facility] 2.

## 2020-12-25 NOTE — Plan of Care (Signed)
  Problem: Health Behavior/Discharge Planning: Goal: Ability to manage health-related needs will improve Outcome: Adequate for Discharge   Problem: Clinical Measurements: Goal: Will remain free from infection Outcome: Adequate for Discharge  Patient received dialysis today and then will be discharged.  Family at bedside and aware of discharge orders.

## 2020-12-25 NOTE — Care Management Important Message (Signed)
Important Message  Patient Details  Name: Amy [name] MRN: [id number] Date of Birth: [birth year]   Medicare Important Message Given:  N/A - LOS <3 / Initial given by admissions  Initial Medicare IM reviewed with [name], daughter at [phone] by [name], Patient Access Associate on 12/24/2020 at 10:05am.   [name] 12/25/2020, 2:40 PM

## 2020-12-25 NOTE — Progress Notes (Signed)
Patient is alert and oriented with no complaints also completed a goal of 3000.

## 2020-12-25 NOTE — TOC Transition Note (Signed)
Transition of Care [facility]) - CM/SW Discharge Note   Patient Details  Name: Amy [name] MRN: [id number] Date of Birth: [birth year]  [facility] of [facility] CM/SW Contact:  Candie Chroman, LCSW Phone Number: 12/25/2020, 2:43 PM   Clinical Narrative:   Patient has orders to discharge home today. RN will call report to [phone] (Room 102). EMS transport has been arranged and she is 3rd on the list. HD coordinator confirmed she will go to HD tomorrow. SNF admissions coordinator, patient, and daughter aware. No further concerns. CSW signing off.  Final next level of care: Skilled Nursing Facility Barriers to Discharge: Barriers Resolved   Patient Goals and CMS Choice     Choice offered to / list presented to : Patient, Spouse  Discharge Placement   Existing PASRR number confirmed : 12/24/20          Patient chooses bed at: [facility] Patient to be transferred to facility by: EMS Name of family member notified: [name] Patient and family notified of of transfer: 12/25/20  Discharge Plan and Services     Post Acute Care Choice: Resumption of Svcs/PTA Provider                               Social Determinants of Health (SDOH) Interventions     Readmission Risk Interventions Readmission Risk Prevention Plan 12/25/2020 11/09/2020 06/24/2020  Transportation Screening Complete Complete Complete  PCP or Specialist Appt within 3-5 Days - - Complete  HRI or [place] - - Complete  Social Work Consult for [place] Planning/Counseling - - Complete  Palliative Care Screening - - Not Applicable  Medication Review Press photographer) Complete Complete Complete  PCP or Specialist appointment within 3-5 days of discharge Complete Complete -  [place] or Home Care Consult - Complete -  SW Recovery Care/Counseling Consult Complete Complete -  Palliative Care Screening Not Applicable Not Applicable -  Skilled Nursing Facility Complete Complete -  Some recent data  might be hidden

## 2020-12-25 NOTE — Progress Notes (Signed)
Discharge report called to [place] who verbalized understanding and acceptance.  Awaiting EMS transport.  Daughter at bedside.

## 2020-12-26 DIAGNOSIS — G479 Sleep disorder, unspecified: Secondary | ICD-10-CM | POA: Diagnosis not present

## 2020-12-26 DIAGNOSIS — D631 Anemia in chronic kidney disease: Secondary | ICD-10-CM | POA: Diagnosis not present

## 2020-12-26 DIAGNOSIS — G4452 New daily persistent headache (NDPH): Secondary | ICD-10-CM | POA: Diagnosis not present

## 2020-12-26 DIAGNOSIS — I5020 Unspecified systolic (congestive) heart failure: Secondary | ICD-10-CM | POA: Diagnosis not present

## 2020-12-26 DIAGNOSIS — D509 Iron deficiency anemia, unspecified: Secondary | ICD-10-CM | POA: Diagnosis not present

## 2020-12-26 DIAGNOSIS — R55 Syncope and collapse: Secondary | ICD-10-CM | POA: Diagnosis not present

## 2020-12-26 DIAGNOSIS — N390 Urinary tract infection, site not specified: Secondary | ICD-10-CM | POA: Diagnosis not present

## 2020-12-26 DIAGNOSIS — N2581 Secondary hyperparathyroidism of renal origin: Secondary | ICD-10-CM | POA: Diagnosis not present

## 2020-12-26 DIAGNOSIS — Z992 Dependence on renal dialysis: Secondary | ICD-10-CM | POA: Diagnosis not present

## 2020-12-26 DIAGNOSIS — N186 End stage renal disease: Secondary | ICD-10-CM | POA: Diagnosis not present

## 2020-12-26 LAB — URINE CULTURE: Culture: >=100000 — AB

## 2020-12-27 DIAGNOSIS — R2689 Other abnormalities of gait and mobility: Secondary | ICD-10-CM | POA: Diagnosis not present

## 2020-12-27 DIAGNOSIS — S065X0D Traumatic subdural hemorrhage without loss of consciousness, subsequent encounter: Secondary | ICD-10-CM | POA: Diagnosis not present

## 2020-12-27 DIAGNOSIS — I2510 Atherosclerotic heart disease of native coronary artery without angina pectoris: Secondary | ICD-10-CM | POA: Diagnosis not present

## 2020-12-27 DIAGNOSIS — S065X9D Traumatic subdural hemorrhage with loss of consciousness of unspecified duration, subsequent encounter: Secondary | ICD-10-CM | POA: Diagnosis not present

## 2020-12-27 DIAGNOSIS — Z741 Need for assistance with personal care: Secondary | ICD-10-CM | POA: Diagnosis not present

## 2020-12-27 DIAGNOSIS — F419 Anxiety disorder, unspecified: Secondary | ICD-10-CM | POA: Diagnosis not present

## 2020-12-27 DIAGNOSIS — R4189 Other symptoms and signs involving cognitive functions and awareness: Secondary | ICD-10-CM | POA: Diagnosis not present

## 2020-12-27 DIAGNOSIS — W19XXXD Unspecified fall, subsequent encounter: Secondary | ICD-10-CM | POA: Diagnosis not present

## 2020-12-27 DIAGNOSIS — G40909 Epilepsy, unspecified, not intractable, without status epilepticus: Secondary | ICD-10-CM | POA: Diagnosis not present

## 2020-12-27 DIAGNOSIS — A0472 Enterocolitis due to Clostridium difficile, not specified as recurrent: Secondary | ICD-10-CM | POA: Diagnosis not present

## 2020-12-27 DIAGNOSIS — R278 Other lack of coordination: Secondary | ICD-10-CM | POA: Diagnosis not present

## 2020-12-27 DIAGNOSIS — N186 End stage renal disease: Secondary | ICD-10-CM | POA: Diagnosis not present

## 2020-12-27 DIAGNOSIS — M6281 Muscle weakness (generalized): Secondary | ICD-10-CM | POA: Diagnosis not present

## 2020-12-27 LAB — CULTURE, BLOOD (SINGLE): Culture: NO GROWTH

## 2020-12-29 DIAGNOSIS — Z992 Dependence on renal dialysis: Secondary | ICD-10-CM | POA: Diagnosis not present

## 2020-12-29 DIAGNOSIS — N2581 Secondary hyperparathyroidism of renal origin: Secondary | ICD-10-CM | POA: Diagnosis not present

## 2020-12-29 DIAGNOSIS — D509 Iron deficiency anemia, unspecified: Secondary | ICD-10-CM | POA: Diagnosis not present

## 2020-12-29 DIAGNOSIS — N186 End stage renal disease: Secondary | ICD-10-CM | POA: Diagnosis not present

## 2020-12-29 DIAGNOSIS — D631 Anemia in chronic kidney disease: Secondary | ICD-10-CM | POA: Diagnosis not present

## 2020-12-30 DIAGNOSIS — N186 End stage renal disease: Secondary | ICD-10-CM | POA: Diagnosis not present

## 2020-12-30 DIAGNOSIS — W19XXXD Unspecified fall, subsequent encounter: Secondary | ICD-10-CM | POA: Diagnosis not present

## 2020-12-30 DIAGNOSIS — S065X9D Traumatic subdural hemorrhage with loss of consciousness of unspecified duration, subsequent encounter: Secondary | ICD-10-CM | POA: Diagnosis not present

## 2020-12-30 DIAGNOSIS — F419 Anxiety disorder, unspecified: Secondary | ICD-10-CM | POA: Diagnosis not present

## 2020-12-30 DIAGNOSIS — I2510 Atherosclerotic heart disease of native coronary artery without angina pectoris: Secondary | ICD-10-CM | POA: Diagnosis not present

## 2020-12-30 DIAGNOSIS — G40909 Epilepsy, unspecified, not intractable, without status epilepticus: Secondary | ICD-10-CM | POA: Diagnosis not present

## 2020-12-31 DIAGNOSIS — N186 End stage renal disease: Secondary | ICD-10-CM | POA: Diagnosis not present

## 2020-12-31 DIAGNOSIS — N2581 Secondary hyperparathyroidism of renal origin: Secondary | ICD-10-CM | POA: Diagnosis not present

## 2020-12-31 DIAGNOSIS — D631 Anemia in chronic kidney disease: Secondary | ICD-10-CM | POA: Diagnosis not present

## 2020-12-31 DIAGNOSIS — D509 Iron deficiency anemia, unspecified: Secondary | ICD-10-CM | POA: Diagnosis not present

## 2020-12-31 DIAGNOSIS — Z992 Dependence on renal dialysis: Secondary | ICD-10-CM | POA: Diagnosis not present

## 2021-01-01 DIAGNOSIS — N186 End stage renal disease: Secondary | ICD-10-CM | POA: Diagnosis not present

## 2021-01-01 DIAGNOSIS — I2510 Atherosclerotic heart disease of native coronary artery without angina pectoris: Secondary | ICD-10-CM | POA: Diagnosis not present

## 2021-01-01 DIAGNOSIS — R2689 Other abnormalities of gait and mobility: Secondary | ICD-10-CM | POA: Diagnosis not present

## 2021-01-01 DIAGNOSIS — A0472 Enterocolitis due to Clostridium difficile, not specified as recurrent: Secondary | ICD-10-CM | POA: Diagnosis not present

## 2021-01-01 DIAGNOSIS — G40909 Epilepsy, unspecified, not intractable, without status epilepticus: Secondary | ICD-10-CM | POA: Diagnosis not present

## 2021-01-01 DIAGNOSIS — S065X0D Traumatic subdural hemorrhage without loss of consciousness, subsequent encounter: Secondary | ICD-10-CM | POA: Diagnosis not present

## 2021-01-01 DIAGNOSIS — M6281 Muscle weakness (generalized): Secondary | ICD-10-CM | POA: Diagnosis not present

## 2021-01-01 DIAGNOSIS — R4189 Other symptoms and signs involving cognitive functions and awareness: Secondary | ICD-10-CM | POA: Diagnosis not present

## 2021-01-01 DIAGNOSIS — R278 Other lack of coordination: Secondary | ICD-10-CM | POA: Diagnosis not present

## 2021-01-01 DIAGNOSIS — Z741 Need for assistance with personal care: Secondary | ICD-10-CM | POA: Diagnosis not present

## 2021-01-01 DIAGNOSIS — W19XXXD Unspecified fall, subsequent encounter: Secondary | ICD-10-CM | POA: Diagnosis not present

## 2021-01-01 DIAGNOSIS — F419 Anxiety disorder, unspecified: Secondary | ICD-10-CM | POA: Diagnosis not present

## 2021-01-01 DIAGNOSIS — S065X9D Traumatic subdural hemorrhage with loss of consciousness of unspecified duration, subsequent encounter: Secondary | ICD-10-CM | POA: Diagnosis not present

## 2021-01-02 DIAGNOSIS — N186 End stage renal disease: Secondary | ICD-10-CM | POA: Diagnosis not present

## 2021-01-02 DIAGNOSIS — D631 Anemia in chronic kidney disease: Secondary | ICD-10-CM | POA: Diagnosis not present

## 2021-01-02 DIAGNOSIS — N2581 Secondary hyperparathyroidism of renal origin: Secondary | ICD-10-CM | POA: Diagnosis not present

## 2021-01-02 DIAGNOSIS — D509 Iron deficiency anemia, unspecified: Secondary | ICD-10-CM | POA: Diagnosis not present

## 2021-01-02 DIAGNOSIS — Z992 Dependence on renal dialysis: Secondary | ICD-10-CM | POA: Diagnosis not present

## 2021-01-05 DIAGNOSIS — Z992 Dependence on renal dialysis: Secondary | ICD-10-CM | POA: Diagnosis not present

## 2021-01-05 DIAGNOSIS — D631 Anemia in chronic kidney disease: Secondary | ICD-10-CM | POA: Diagnosis not present

## 2021-01-05 DIAGNOSIS — D509 Iron deficiency anemia, unspecified: Secondary | ICD-10-CM | POA: Diagnosis not present

## 2021-01-05 DIAGNOSIS — N186 End stage renal disease: Secondary | ICD-10-CM | POA: Diagnosis not present

## 2021-01-05 DIAGNOSIS — N2581 Secondary hyperparathyroidism of renal origin: Secondary | ICD-10-CM | POA: Diagnosis not present

## 2021-01-06 ENCOUNTER — Ambulatory Visit: Payer: Medicare Other | Admitting: Cardiovascular Disease

## 2021-01-06 DIAGNOSIS — N186 End stage renal disease: Secondary | ICD-10-CM | POA: Diagnosis not present

## 2021-01-06 DIAGNOSIS — W19XXXD Unspecified fall, subsequent encounter: Secondary | ICD-10-CM | POA: Diagnosis not present

## 2021-01-06 DIAGNOSIS — F419 Anxiety disorder, unspecified: Secondary | ICD-10-CM | POA: Diagnosis not present

## 2021-01-06 DIAGNOSIS — S065X9D Traumatic subdural hemorrhage with loss of consciousness of unspecified duration, subsequent encounter: Secondary | ICD-10-CM | POA: Diagnosis not present

## 2021-01-06 DIAGNOSIS — I2510 Atherosclerotic heart disease of native coronary artery without angina pectoris: Secondary | ICD-10-CM | POA: Diagnosis not present

## 2021-01-06 DIAGNOSIS — G40909 Epilepsy, unspecified, not intractable, without status epilepticus: Secondary | ICD-10-CM | POA: Diagnosis not present

## 2021-01-07 DIAGNOSIS — D631 Anemia in chronic kidney disease: Secondary | ICD-10-CM | POA: Diagnosis not present

## 2021-01-07 DIAGNOSIS — Z992 Dependence on renal dialysis: Secondary | ICD-10-CM | POA: Diagnosis not present

## 2021-01-07 DIAGNOSIS — D509 Iron deficiency anemia, unspecified: Secondary | ICD-10-CM | POA: Diagnosis not present

## 2021-01-07 DIAGNOSIS — N2581 Secondary hyperparathyroidism of renal origin: Secondary | ICD-10-CM | POA: Diagnosis not present

## 2021-01-07 DIAGNOSIS — N186 End stage renal disease: Secondary | ICD-10-CM | POA: Diagnosis not present

## 2021-01-08 DIAGNOSIS — G40909 Epilepsy, unspecified, not intractable, without status epilepticus: Secondary | ICD-10-CM | POA: Diagnosis not present

## 2021-01-08 DIAGNOSIS — F419 Anxiety disorder, unspecified: Secondary | ICD-10-CM | POA: Diagnosis not present

## 2021-01-08 DIAGNOSIS — N186 End stage renal disease: Secondary | ICD-10-CM | POA: Diagnosis not present

## 2021-01-08 DIAGNOSIS — S065X9D Traumatic subdural hemorrhage with loss of consciousness of unspecified duration, subsequent encounter: Secondary | ICD-10-CM | POA: Diagnosis not present

## 2021-01-08 DIAGNOSIS — W19XXXD Unspecified fall, subsequent encounter: Secondary | ICD-10-CM | POA: Diagnosis not present

## 2021-01-08 DIAGNOSIS — I2510 Atherosclerotic heart disease of native coronary artery without angina pectoris: Secondary | ICD-10-CM | POA: Diagnosis not present

## 2021-01-09 DIAGNOSIS — Z992 Dependence on renal dialysis: Secondary | ICD-10-CM | POA: Diagnosis not present

## 2021-01-09 DIAGNOSIS — D631 Anemia in chronic kidney disease: Secondary | ICD-10-CM | POA: Diagnosis not present

## 2021-01-09 DIAGNOSIS — N2581 Secondary hyperparathyroidism of renal origin: Secondary | ICD-10-CM | POA: Diagnosis not present

## 2021-01-09 DIAGNOSIS — N186 End stage renal disease: Secondary | ICD-10-CM | POA: Diagnosis not present

## 2021-01-09 DIAGNOSIS — D509 Iron deficiency anemia, unspecified: Secondary | ICD-10-CM | POA: Diagnosis not present

## 2021-01-10 DIAGNOSIS — N186 End stage renal disease: Secondary | ICD-10-CM | POA: Diagnosis not present

## 2021-01-10 DIAGNOSIS — G40909 Epilepsy, unspecified, not intractable, without status epilepticus: Secondary | ICD-10-CM | POA: Diagnosis not present

## 2021-01-10 DIAGNOSIS — I2510 Atherosclerotic heart disease of native coronary artery without angina pectoris: Secondary | ICD-10-CM | POA: Diagnosis not present

## 2021-01-10 DIAGNOSIS — S065X9D Traumatic subdural hemorrhage with loss of consciousness of unspecified duration, subsequent encounter: Secondary | ICD-10-CM | POA: Diagnosis not present

## 2021-01-10 DIAGNOSIS — F419 Anxiety disorder, unspecified: Secondary | ICD-10-CM | POA: Diagnosis not present

## 2021-01-10 DIAGNOSIS — W19XXXD Unspecified fall, subsequent encounter: Secondary | ICD-10-CM | POA: Diagnosis not present

## 2021-01-12 DIAGNOSIS — D509 Iron deficiency anemia, unspecified: Secondary | ICD-10-CM | POA: Diagnosis not present

## 2021-01-12 DIAGNOSIS — N186 End stage renal disease: Secondary | ICD-10-CM | POA: Diagnosis not present

## 2021-01-12 DIAGNOSIS — Z992 Dependence on renal dialysis: Secondary | ICD-10-CM | POA: Diagnosis not present

## 2021-01-12 DIAGNOSIS — D631 Anemia in chronic kidney disease: Secondary | ICD-10-CM | POA: Diagnosis not present

## 2021-01-12 DIAGNOSIS — N2581 Secondary hyperparathyroidism of renal origin: Secondary | ICD-10-CM | POA: Diagnosis not present

## 2021-01-13 DIAGNOSIS — G40909 Epilepsy, unspecified, not intractable, without status epilepticus: Secondary | ICD-10-CM | POA: Diagnosis not present

## 2021-01-13 DIAGNOSIS — I2510 Atherosclerotic heart disease of native coronary artery without angina pectoris: Secondary | ICD-10-CM | POA: Diagnosis not present

## 2021-01-13 DIAGNOSIS — W19XXXD Unspecified fall, subsequent encounter: Secondary | ICD-10-CM | POA: Diagnosis not present

## 2021-01-13 DIAGNOSIS — S065X9D Traumatic subdural hemorrhage with loss of consciousness of unspecified duration, subsequent encounter: Secondary | ICD-10-CM | POA: Diagnosis not present

## 2021-01-13 DIAGNOSIS — N186 End stage renal disease: Secondary | ICD-10-CM | POA: Diagnosis not present

## 2021-01-13 DIAGNOSIS — F419 Anxiety disorder, unspecified: Secondary | ICD-10-CM | POA: Diagnosis not present

## 2021-01-14 ENCOUNTER — Encounter (INDEPENDENT_AMBULATORY_CARE_PROVIDER_SITE_OTHER): Payer: Self-pay

## 2021-01-14 ENCOUNTER — Telehealth (INDEPENDENT_AMBULATORY_CARE_PROVIDER_SITE_OTHER): Payer: Self-pay

## 2021-01-14 DIAGNOSIS — D509 Iron deficiency anemia, unspecified: Secondary | ICD-10-CM | POA: Diagnosis not present

## 2021-01-14 DIAGNOSIS — N2581 Secondary hyperparathyroidism of renal origin: Secondary | ICD-10-CM | POA: Diagnosis not present

## 2021-01-14 DIAGNOSIS — N186 End stage renal disease: Secondary | ICD-10-CM | POA: Diagnosis not present

## 2021-01-14 DIAGNOSIS — Z992 Dependence on renal dialysis: Secondary | ICD-10-CM | POA: Diagnosis not present

## 2021-01-14 DIAGNOSIS — D631 Anemia in chronic kidney disease: Secondary | ICD-10-CM | POA: Diagnosis not present

## 2021-01-14 NOTE — Telephone Encounter (Signed)
Patient is schedule CVC exchange with Dr Lucky Cowboy on 01/19/21 arrival time 12 pm at the [facility]. Pre-procedure instructions will be faxed over to dialysis at [phone]. I spoke with Mitzi Hansen from [place] and he informed that patient can be scheduled for this date.

## 2021-01-16 DIAGNOSIS — S065X9A Traumatic subdural hemorrhage with loss of consciousness of unspecified duration, initial encounter: Secondary | ICD-10-CM | POA: Diagnosis not present

## 2021-01-16 DIAGNOSIS — R29898 Other symptoms and signs involving the musculoskeletal system: Secondary | ICD-10-CM | POA: Diagnosis not present

## 2021-01-16 DIAGNOSIS — M48061 Spinal stenosis, lumbar region without neurogenic claudication: Secondary | ICD-10-CM | POA: Diagnosis not present

## 2021-01-16 DIAGNOSIS — D509 Iron deficiency anemia, unspecified: Secondary | ICD-10-CM | POA: Diagnosis not present

## 2021-01-16 DIAGNOSIS — Z955 Presence of coronary angioplasty implant and graft: Secondary | ICD-10-CM | POA: Diagnosis not present

## 2021-01-16 DIAGNOSIS — I252 Old myocardial infarction: Secondary | ICD-10-CM | POA: Diagnosis not present

## 2021-01-16 DIAGNOSIS — M545 Low back pain, unspecified: Secondary | ICD-10-CM | POA: Diagnosis not present

## 2021-01-16 DIAGNOSIS — Z8619 Personal history of other infectious and parasitic diseases: Secondary | ICD-10-CM | POA: Diagnosis not present

## 2021-01-16 DIAGNOSIS — W1839XA Other fall on same level, initial encounter: Secondary | ICD-10-CM | POA: Diagnosis not present

## 2021-01-16 DIAGNOSIS — Z79899 Other long term (current) drug therapy: Secondary | ICD-10-CM | POA: Diagnosis not present

## 2021-01-16 DIAGNOSIS — I2510 Atherosclerotic heart disease of native coronary artery without angina pectoris: Secondary | ICD-10-CM | POA: Diagnosis not present

## 2021-01-16 DIAGNOSIS — Z992 Dependence on renal dialysis: Secondary | ICD-10-CM | POA: Diagnosis not present

## 2021-01-16 DIAGNOSIS — N2581 Secondary hyperparathyroidism of renal origin: Secondary | ICD-10-CM | POA: Diagnosis not present

## 2021-01-16 DIAGNOSIS — Y92009 Unspecified place in unspecified non-institutional (private) residence as the place of occurrence of the external cause: Secondary | ICD-10-CM | POA: Diagnosis not present

## 2021-01-16 DIAGNOSIS — R569 Unspecified convulsions: Secondary | ICD-10-CM | POA: Diagnosis not present

## 2021-01-16 DIAGNOSIS — D631 Anemia in chronic kidney disease: Secondary | ICD-10-CM | POA: Diagnosis not present

## 2021-01-16 DIAGNOSIS — G8929 Other chronic pain: Secondary | ICD-10-CM | POA: Diagnosis not present

## 2021-01-16 DIAGNOSIS — N186 End stage renal disease: Secondary | ICD-10-CM | POA: Diagnosis not present

## 2021-01-16 DIAGNOSIS — K219 Gastro-esophageal reflux disease without esophagitis: Secondary | ICD-10-CM | POA: Diagnosis not present

## 2021-01-16 DIAGNOSIS — S065X9D Traumatic subdural hemorrhage with loss of consciousness of unspecified duration, subsequent encounter: Secondary | ICD-10-CM | POA: Diagnosis not present

## 2021-01-17 DIAGNOSIS — N186 End stage renal disease: Secondary | ICD-10-CM | POA: Diagnosis not present

## 2021-01-17 DIAGNOSIS — W19XXXD Unspecified fall, subsequent encounter: Secondary | ICD-10-CM | POA: Diagnosis not present

## 2021-01-17 DIAGNOSIS — G40909 Epilepsy, unspecified, not intractable, without status epilepticus: Secondary | ICD-10-CM | POA: Diagnosis not present

## 2021-01-17 DIAGNOSIS — I2510 Atherosclerotic heart disease of native coronary artery without angina pectoris: Secondary | ICD-10-CM | POA: Diagnosis not present

## 2021-01-17 DIAGNOSIS — F419 Anxiety disorder, unspecified: Secondary | ICD-10-CM | POA: Diagnosis not present

## 2021-01-17 DIAGNOSIS — S065X9D Traumatic subdural hemorrhage with loss of consciousness of unspecified duration, subsequent encounter: Secondary | ICD-10-CM | POA: Diagnosis not present

## 2021-01-19 ENCOUNTER — Ambulatory Visit
Admission: RE | Admit: 2021-01-19 | Discharge: 2021-01-19 | Disposition: A | Discharge status: Rehabilitation Facility | Payer: Medicare Other | Attending: Vascular Surgery | Admitting: Vascular Surgery

## 2021-01-19 ENCOUNTER — Encounter: Payer: Self-pay | Admitting: Vascular Surgery

## 2021-01-19 ENCOUNTER — Encounter
Admission: RE | Disposition: A | Discharge status: Rehabilitation Facility | Payer: Self-pay | Source: Home / Self Care | Attending: Vascular Surgery

## 2021-01-19 ENCOUNTER — Other Ambulatory Visit (INDEPENDENT_AMBULATORY_CARE_PROVIDER_SITE_OTHER): Payer: Self-pay | Admitting: Nurse Practitioner

## 2021-01-19 DIAGNOSIS — I132 Hypertensive heart and chronic kidney disease with heart failure and with stage 5 chronic kidney disease, or end stage renal disease: Secondary | ICD-10-CM | POA: Diagnosis not present

## 2021-01-19 DIAGNOSIS — Z888 Allergy status to other drugs, medicaments and biological substances status: Secondary | ICD-10-CM | POA: Insufficient documentation

## 2021-01-19 DIAGNOSIS — Y712 Prosthetic and other implants, materials and accessory cardiovascular devices associated with adverse incidents: Secondary | ICD-10-CM | POA: Diagnosis not present

## 2021-01-19 DIAGNOSIS — T82594A Other mechanical complication of infusion catheter, initial encounter: Secondary | ICD-10-CM | POA: Diagnosis not present

## 2021-01-19 DIAGNOSIS — Z880 Allergy status to penicillin: Secondary | ICD-10-CM | POA: Insufficient documentation

## 2021-01-19 DIAGNOSIS — E785 Hyperlipidemia, unspecified: Secondary | ICD-10-CM | POA: Diagnosis not present

## 2021-01-19 DIAGNOSIS — Z7982 Long term (current) use of aspirin: Secondary | ICD-10-CM | POA: Diagnosis not present

## 2021-01-19 DIAGNOSIS — Z882 Allergy status to sulfonamides status: Secondary | ICD-10-CM | POA: Insufficient documentation

## 2021-01-19 DIAGNOSIS — Z79899 Other long term (current) drug therapy: Secondary | ICD-10-CM | POA: Insufficient documentation

## 2021-01-19 DIAGNOSIS — N186 End stage renal disease: Secondary | ICD-10-CM | POA: Diagnosis not present

## 2021-01-19 DIAGNOSIS — I5022 Chronic systolic (congestive) heart failure: Secondary | ICD-10-CM | POA: Insufficient documentation

## 2021-01-19 DIAGNOSIS — Z4901 Encounter for fitting and adjustment of extracorporeal dialysis catheter: Secondary | ICD-10-CM | POA: Insufficient documentation

## 2021-01-19 HISTORY — PX: DIALYSIS/PERMA CATHETER INSERTION: CATH118288

## 2021-01-19 LAB — POTASSIUM (ARMC VASCULAR LAB ONLY): Potassium (ARMC vascular lab): 3.3 — ABNORMAL LOW (ref 3.5–5.1)

## 2021-01-19 SURGERY — DIALYSIS/PERMA CATHETER INSERTION
Anesthesia: Moderate Sedation

## 2021-01-19 MED ORDER — HYDROMORPHONE HCL 1 MG/ML IJ SOLN: 1.0000 mg | Freq: Once | INTRAMUSCULAR | Status: DC | PRN

## 2021-01-19 MED ORDER — FAMOTIDINE 20 MG PO TABS: 40.0000 mg | ORAL_TABLET | Freq: Once | ORAL | Status: DC | PRN

## 2021-01-19 MED ORDER — MIDAZOLAM HCL 2 MG/ML PO SYRP: 8.0000 mg | ORAL_SOLUTION | Freq: Once | ORAL | Status: DC | PRN

## 2021-01-19 MED ORDER — FENTANYL CITRATE PF 50 MCG/ML IJ SOSY
PREFILLED_SYRINGE | INTRAMUSCULAR | Status: AC
  Filled 2021-01-19: qty 1

## 2021-01-19 MED ORDER — SODIUM CHLORIDE 0.9 % IV SOLN: INTRAVENOUS | Status: DC

## 2021-01-19 MED ORDER — FENTANYL CITRATE (PF) 100 MCG/2ML IJ SOLN
INTRAMUSCULAR | Status: DC | PRN
  Administered 2021-01-19: 50 ug via INTRAVENOUS

## 2021-01-19 MED ORDER — MIDAZOLAM HCL 2 MG/2ML IJ SOLN
INTRAMUSCULAR | Status: DC | PRN
  Administered 2021-01-19: 1 mg via INTRAVENOUS

## 2021-01-19 MED ORDER — HEPARIN SODIUM (PORCINE) 10000 UNIT/ML IJ SOLN
INTRAMUSCULAR | Status: AC
  Filled 2021-01-19: qty 1

## 2021-01-19 MED ORDER — ONDANSETRON HCL 4 MG/2ML IJ SOLN: 4.0000 mg | Freq: Four times a day (QID) | INTRAMUSCULAR | Status: DC | PRN

## 2021-01-19 MED ORDER — CLINDAMYCIN PHOSPHATE 300 MG/50ML IV SOLN
INTRAVENOUS | Status: AC
  Administered 2021-01-19: 300 mg via INTRAVENOUS
  Filled 2021-01-19: qty 50

## 2021-01-19 MED ORDER — CLINDAMYCIN PHOSPHATE 300 MG/50ML IV SOLN: 300.0000 mg | Freq: Once | INTRAVENOUS | Status: AC

## 2021-01-19 MED ORDER — METHYLPREDNISOLONE SODIUM SUCC 125 MG IJ SOLR: 125.0000 mg | Freq: Once | INTRAMUSCULAR | Status: DC | PRN

## 2021-01-19 MED ORDER — DIPHENHYDRAMINE HCL 50 MG/ML IJ SOLN: 50.0000 mg | Freq: Once | INTRAMUSCULAR | Status: DC | PRN

## 2021-01-19 MED ORDER — MIDAZOLAM HCL 2 MG/2ML IJ SOLN
INTRAMUSCULAR | Status: AC
  Filled 2021-01-19: qty 2

## 2021-01-19 MED ORDER — CHLORHEXIDINE GLUCONATE CLOTH 2 % EX PADS: 6.0000 | MEDICATED_PAD | Freq: Every day | CUTANEOUS | Status: DC

## 2021-01-19 SURGICAL SUPPLY — 6 items
BIOPATCH RED 1 DISK 7.0 (GAUZE/BANDAGES/DRESSINGS) ×2 IMPLANT
CATH PALINDROME-P 19CM W/VT (CATHETERS) ×2 IMPLANT
GUIDEWIRE SUPER STIFF .035X180 (WIRE) ×2 IMPLANT
PACK ANGIOGRAPHY (CUSTOM PROCEDURE TRAY) ×2 IMPLANT
SUT MNCRL AB 4-0 PS2 18 (SUTURE) ×2 IMPLANT
SUT PROLENE 0 CT 1 30 (SUTURE) ×2 IMPLANT

## 2021-01-19 NOTE — Interval H&P Note (Signed)
History and Physical Interval Note:  01/19/2021 11:48 AM  Amy [name]  has presented today for surgery, with the diagnosis of Perm Cath Exchange   ESRD.  The various methods of treatment have been discussed with the patient and family. After consideration of risks, benefits and other options for treatment, the patient has consented to  Procedure(s): DIALYSIS/PERMA CATHETER INSERTION (N/A) as a surgical intervention.  The patient's history has been reviewed, patient examined, no change in status, stable for surgery.  I have reviewed the patient's chart and labs.  Questions were answered to the patient's satisfaction.     Leotis Pain

## 2021-01-19 NOTE — Op Note (Signed)
OPERATIVE NOTE    PRE-OPERATIVE DIAGNOSIS: 1. ESRD 2. Non-functional permcath  POST-OPERATIVE DIAGNOSIS: same as above  PROCEDURE: Fluoroscopic guidance for placement of catheter Placement of a 19 cm tip to cuff tunneled hemodialysis catheter via the right internal jugular vein and removal of previous catheter  SURGEON: Leotis Pain, MD  ANESTHESIA:  Local with moderate conscious sedation for 13 minutes using 1 mg of Versed and 50 mcg of Fentanyl  ESTIMATED BLOOD LOSS: 2 cc  FINDING(S): none  SPECIMEN(S):  None  INDICATIONS:   Patient is a 70 y.o.female who presents with non-functional dialysis catheter and ESRD.  The patient needs long term dialysis access for their ESRD, and a Permcath is necessary.  Risks and benefits are discussed and informed consent is obtained.    DESCRIPTION: After obtaining full informed written consent, the patient was brought back to the vascular suite. The patient received moderate conscious sedation during a face-to-face encounter with me present throughout the entire procedure and supervising the RN monitoring the vital signs, pulse oximetry, telemetry, and mental status throughout the entire procedure. The patient's existing catheter, right neck and chest were sterilely prepped and draped in a sterile surgical field was created.  The existing catheter was dissected free from the fibrous sheath securing the cuff with hemostats and blunt dissection.  A wire was placed. The existing catheter was then removed and the wire used to keep venous access. I selected a 19 cm tip to cuff tunneled dialysis catheter.  Using fluoroscopic guidance the catheter tips were parked in the right atrium. The appropriate distal connectors were placed. It withdrew blood well and flushed easily with heparinized saline and a concentrated heparin solution was then placed. It was secured to the chest wall with 2 Prolene sutures. A 4-0 Monocryl pursestring suture was placed around the exit  site. Sterile dressings were placed. The patient tolerated the procedure well and was taken to the recovery room in stable condition.  COMPLICATIONS: None  CONDITION: Stable  Leotis Pain 01/19/2021 2:07 PM   This note was created with Dragon Medical transcription system. Any errors in dictation are purely unintentional.

## 2021-01-20 DIAGNOSIS — F419 Anxiety disorder, unspecified: Secondary | ICD-10-CM | POA: Diagnosis not present

## 2021-01-20 DIAGNOSIS — D631 Anemia in chronic kidney disease: Secondary | ICD-10-CM | POA: Diagnosis not present

## 2021-01-20 DIAGNOSIS — D509 Iron deficiency anemia, unspecified: Secondary | ICD-10-CM | POA: Diagnosis not present

## 2021-01-20 DIAGNOSIS — N186 End stage renal disease: Secondary | ICD-10-CM | POA: Diagnosis not present

## 2021-01-20 DIAGNOSIS — N2581 Secondary hyperparathyroidism of renal origin: Secondary | ICD-10-CM | POA: Diagnosis not present

## 2021-01-20 DIAGNOSIS — S065X9D Traumatic subdural hemorrhage with loss of consciousness of unspecified duration, subsequent encounter: Secondary | ICD-10-CM | POA: Diagnosis not present

## 2021-01-20 DIAGNOSIS — G40909 Epilepsy, unspecified, not intractable, without status epilepticus: Secondary | ICD-10-CM | POA: Diagnosis not present

## 2021-01-20 DIAGNOSIS — Z992 Dependence on renal dialysis: Secondary | ICD-10-CM | POA: Diagnosis not present

## 2021-01-20 DIAGNOSIS — W19XXXD Unspecified fall, subsequent encounter: Secondary | ICD-10-CM | POA: Diagnosis not present

## 2021-01-20 DIAGNOSIS — I2510 Atherosclerotic heart disease of native coronary artery without angina pectoris: Secondary | ICD-10-CM | POA: Diagnosis not present

## 2021-01-21 DIAGNOSIS — Z992 Dependence on renal dialysis: Secondary | ICD-10-CM | POA: Diagnosis not present

## 2021-01-21 DIAGNOSIS — D631 Anemia in chronic kidney disease: Secondary | ICD-10-CM | POA: Diagnosis not present

## 2021-01-21 DIAGNOSIS — D509 Iron deficiency anemia, unspecified: Secondary | ICD-10-CM | POA: Diagnosis not present

## 2021-01-21 DIAGNOSIS — N2581 Secondary hyperparathyroidism of renal origin: Secondary | ICD-10-CM | POA: Diagnosis not present

## 2021-01-21 DIAGNOSIS — N186 End stage renal disease: Secondary | ICD-10-CM | POA: Diagnosis not present

## 2021-01-22 DIAGNOSIS — G40909 Epilepsy, unspecified, not intractable, without status epilepticus: Secondary | ICD-10-CM | POA: Diagnosis not present

## 2021-01-22 DIAGNOSIS — S065X9D Traumatic subdural hemorrhage with loss of consciousness of unspecified duration, subsequent encounter: Secondary | ICD-10-CM | POA: Diagnosis not present

## 2021-01-22 DIAGNOSIS — F419 Anxiety disorder, unspecified: Secondary | ICD-10-CM | POA: Diagnosis not present

## 2021-01-22 DIAGNOSIS — N186 End stage renal disease: Secondary | ICD-10-CM | POA: Diagnosis not present

## 2021-01-22 DIAGNOSIS — W19XXXD Unspecified fall, subsequent encounter: Secondary | ICD-10-CM | POA: Diagnosis not present

## 2021-01-22 DIAGNOSIS — I2510 Atherosclerotic heart disease of native coronary artery without angina pectoris: Secondary | ICD-10-CM | POA: Diagnosis not present

## 2021-01-23 DIAGNOSIS — D509 Iron deficiency anemia, unspecified: Secondary | ICD-10-CM | POA: Diagnosis not present

## 2021-01-23 DIAGNOSIS — N186 End stage renal disease: Secondary | ICD-10-CM | POA: Diagnosis not present

## 2021-01-23 DIAGNOSIS — N2581 Secondary hyperparathyroidism of renal origin: Secondary | ICD-10-CM | POA: Diagnosis not present

## 2021-01-23 DIAGNOSIS — Z992 Dependence on renal dialysis: Secondary | ICD-10-CM | POA: Diagnosis not present

## 2021-01-23 DIAGNOSIS — D631 Anemia in chronic kidney disease: Secondary | ICD-10-CM | POA: Diagnosis not present

## 2021-01-24 DIAGNOSIS — N186 End stage renal disease: Secondary | ICD-10-CM | POA: Diagnosis not present

## 2021-01-24 DIAGNOSIS — G40909 Epilepsy, unspecified, not intractable, without status epilepticus: Secondary | ICD-10-CM | POA: Diagnosis not present

## 2021-01-24 DIAGNOSIS — I2510 Atherosclerotic heart disease of native coronary artery without angina pectoris: Secondary | ICD-10-CM | POA: Diagnosis not present

## 2021-01-24 DIAGNOSIS — S065X9D Traumatic subdural hemorrhage with loss of consciousness of unspecified duration, subsequent encounter: Secondary | ICD-10-CM | POA: Diagnosis not present

## 2021-01-24 DIAGNOSIS — W19XXXD Unspecified fall, subsequent encounter: Secondary | ICD-10-CM | POA: Diagnosis not present

## 2021-01-24 DIAGNOSIS — F419 Anxiety disorder, unspecified: Secondary | ICD-10-CM | POA: Diagnosis not present

## 2021-01-26 DIAGNOSIS — D631 Anemia in chronic kidney disease: Secondary | ICD-10-CM | POA: Diagnosis not present

## 2021-01-26 DIAGNOSIS — N186 End stage renal disease: Secondary | ICD-10-CM | POA: Diagnosis not present

## 2021-01-26 DIAGNOSIS — N2581 Secondary hyperparathyroidism of renal origin: Secondary | ICD-10-CM | POA: Diagnosis not present

## 2021-01-26 DIAGNOSIS — D509 Iron deficiency anemia, unspecified: Secondary | ICD-10-CM | POA: Diagnosis not present

## 2021-01-26 DIAGNOSIS — Z992 Dependence on renal dialysis: Secondary | ICD-10-CM | POA: Diagnosis not present

## 2021-01-27 DIAGNOSIS — N186 End stage renal disease: Secondary | ICD-10-CM | POA: Diagnosis not present

## 2021-01-27 DIAGNOSIS — F419 Anxiety disorder, unspecified: Secondary | ICD-10-CM | POA: Diagnosis not present

## 2021-01-27 DIAGNOSIS — I2510 Atherosclerotic heart disease of native coronary artery without angina pectoris: Secondary | ICD-10-CM | POA: Diagnosis not present

## 2021-01-27 DIAGNOSIS — S065X9D Traumatic subdural hemorrhage with loss of consciousness of unspecified duration, subsequent encounter: Secondary | ICD-10-CM | POA: Diagnosis not present

## 2021-01-27 DIAGNOSIS — W19XXXD Unspecified fall, subsequent encounter: Secondary | ICD-10-CM | POA: Diagnosis not present

## 2021-01-27 DIAGNOSIS — G40909 Epilepsy, unspecified, not intractable, without status epilepticus: Secondary | ICD-10-CM | POA: Diagnosis not present

## 2021-01-28 DIAGNOSIS — D631 Anemia in chronic kidney disease: Secondary | ICD-10-CM | POA: Diagnosis not present

## 2021-01-28 DIAGNOSIS — N186 End stage renal disease: Secondary | ICD-10-CM | POA: Diagnosis not present

## 2021-01-28 DIAGNOSIS — Z992 Dependence on renal dialysis: Secondary | ICD-10-CM | POA: Diagnosis not present

## 2021-01-28 DIAGNOSIS — N2581 Secondary hyperparathyroidism of renal origin: Secondary | ICD-10-CM | POA: Diagnosis not present

## 2021-01-28 DIAGNOSIS — D509 Iron deficiency anemia, unspecified: Secondary | ICD-10-CM | POA: Diagnosis not present

## 2021-01-29 DIAGNOSIS — I2510 Atherosclerotic heart disease of native coronary artery without angina pectoris: Secondary | ICD-10-CM | POA: Diagnosis not present

## 2021-01-29 DIAGNOSIS — G40909 Epilepsy, unspecified, not intractable, without status epilepticus: Secondary | ICD-10-CM | POA: Diagnosis not present

## 2021-01-29 DIAGNOSIS — F419 Anxiety disorder, unspecified: Secondary | ICD-10-CM | POA: Diagnosis not present

## 2021-01-29 DIAGNOSIS — W19XXXD Unspecified fall, subsequent encounter: Secondary | ICD-10-CM | POA: Diagnosis not present

## 2021-01-29 DIAGNOSIS — N186 End stage renal disease: Secondary | ICD-10-CM | POA: Diagnosis not present

## 2021-01-29 DIAGNOSIS — S065X9D Traumatic subdural hemorrhage with loss of consciousness of unspecified duration, subsequent encounter: Secondary | ICD-10-CM | POA: Diagnosis not present

## 2021-01-30 DIAGNOSIS — Z992 Dependence on renal dialysis: Secondary | ICD-10-CM | POA: Diagnosis not present

## 2021-01-30 DIAGNOSIS — N2581 Secondary hyperparathyroidism of renal origin: Secondary | ICD-10-CM | POA: Diagnosis not present

## 2021-01-30 DIAGNOSIS — N186 End stage renal disease: Secondary | ICD-10-CM | POA: Diagnosis not present

## 2021-01-30 DIAGNOSIS — D509 Iron deficiency anemia, unspecified: Secondary | ICD-10-CM | POA: Diagnosis not present

## 2021-01-30 DIAGNOSIS — D631 Anemia in chronic kidney disease: Secondary | ICD-10-CM | POA: Diagnosis not present

## 2021-02-02 DIAGNOSIS — N2581 Secondary hyperparathyroidism of renal origin: Secondary | ICD-10-CM | POA: Diagnosis not present

## 2021-02-02 DIAGNOSIS — D509 Iron deficiency anemia, unspecified: Secondary | ICD-10-CM | POA: Diagnosis not present

## 2021-02-02 DIAGNOSIS — Z992 Dependence on renal dialysis: Secondary | ICD-10-CM | POA: Diagnosis not present

## 2021-02-02 DIAGNOSIS — D631 Anemia in chronic kidney disease: Secondary | ICD-10-CM | POA: Diagnosis not present

## 2021-02-02 DIAGNOSIS — N186 End stage renal disease: Secondary | ICD-10-CM | POA: Diagnosis not present

## 2021-02-03 DIAGNOSIS — I2510 Atherosclerotic heart disease of native coronary artery without angina pectoris: Secondary | ICD-10-CM | POA: Diagnosis not present

## 2021-02-03 DIAGNOSIS — R4189 Other symptoms and signs involving cognitive functions and awareness: Secondary | ICD-10-CM | POA: Diagnosis not present

## 2021-02-03 DIAGNOSIS — R278 Other lack of coordination: Secondary | ICD-10-CM | POA: Diagnosis not present

## 2021-02-03 DIAGNOSIS — W19XXXD Unspecified fall, subsequent encounter: Secondary | ICD-10-CM | POA: Diagnosis not present

## 2021-02-03 DIAGNOSIS — M6281 Muscle weakness (generalized): Secondary | ICD-10-CM | POA: Diagnosis not present

## 2021-02-03 DIAGNOSIS — A0472 Enterocolitis due to Clostridium difficile, not specified as recurrent: Secondary | ICD-10-CM | POA: Diagnosis not present

## 2021-02-03 DIAGNOSIS — R2689 Other abnormalities of gait and mobility: Secondary | ICD-10-CM | POA: Diagnosis not present

## 2021-02-03 DIAGNOSIS — N186 End stage renal disease: Secondary | ICD-10-CM | POA: Diagnosis not present

## 2021-02-03 DIAGNOSIS — F419 Anxiety disorder, unspecified: Secondary | ICD-10-CM | POA: Diagnosis not present

## 2021-02-03 DIAGNOSIS — S065X0D Traumatic subdural hemorrhage without loss of consciousness, subsequent encounter: Secondary | ICD-10-CM | POA: Diagnosis not present

## 2021-02-03 DIAGNOSIS — G40909 Epilepsy, unspecified, not intractable, without status epilepticus: Secondary | ICD-10-CM | POA: Diagnosis not present

## 2021-02-03 DIAGNOSIS — S065X9D Traumatic subdural hemorrhage with loss of consciousness of unspecified duration, subsequent encounter: Secondary | ICD-10-CM | POA: Diagnosis not present

## 2021-02-03 DIAGNOSIS — Z741 Need for assistance with personal care: Secondary | ICD-10-CM | POA: Diagnosis not present

## 2021-02-04 DIAGNOSIS — D631 Anemia in chronic kidney disease: Secondary | ICD-10-CM | POA: Diagnosis not present

## 2021-02-04 DIAGNOSIS — Z992 Dependence on renal dialysis: Secondary | ICD-10-CM | POA: Diagnosis not present

## 2021-02-04 DIAGNOSIS — N2581 Secondary hyperparathyroidism of renal origin: Secondary | ICD-10-CM | POA: Diagnosis not present

## 2021-02-04 DIAGNOSIS — N186 End stage renal disease: Secondary | ICD-10-CM | POA: Diagnosis not present

## 2021-02-04 DIAGNOSIS — D509 Iron deficiency anemia, unspecified: Secondary | ICD-10-CM | POA: Diagnosis not present

## 2021-02-05 DIAGNOSIS — N186 End stage renal disease: Secondary | ICD-10-CM | POA: Diagnosis not present

## 2021-02-05 DIAGNOSIS — W19XXXD Unspecified fall, subsequent encounter: Secondary | ICD-10-CM | POA: Diagnosis not present

## 2021-02-05 DIAGNOSIS — F419 Anxiety disorder, unspecified: Secondary | ICD-10-CM | POA: Diagnosis not present

## 2021-02-05 DIAGNOSIS — G40909 Epilepsy, unspecified, not intractable, without status epilepticus: Secondary | ICD-10-CM | POA: Diagnosis not present

## 2021-02-05 DIAGNOSIS — I132 Hypertensive heart and chronic kidney disease with heart failure and with stage 5 chronic kidney disease, or end stage renal disease: Secondary | ICD-10-CM | POA: Diagnosis not present

## 2021-02-05 DIAGNOSIS — S065X9D Traumatic subdural hemorrhage with loss of consciousness of unspecified duration, subsequent encounter: Secondary | ICD-10-CM | POA: Diagnosis not present

## 2021-02-05 DIAGNOSIS — I2510 Atherosclerotic heart disease of native coronary artery without angina pectoris: Secondary | ICD-10-CM | POA: Diagnosis not present

## 2021-02-06 DIAGNOSIS — N186 End stage renal disease: Secondary | ICD-10-CM | POA: Diagnosis not present

## 2021-02-06 DIAGNOSIS — N2581 Secondary hyperparathyroidism of renal origin: Secondary | ICD-10-CM | POA: Diagnosis not present

## 2021-02-06 DIAGNOSIS — D631 Anemia in chronic kidney disease: Secondary | ICD-10-CM | POA: Diagnosis not present

## 2021-02-06 DIAGNOSIS — D509 Iron deficiency anemia, unspecified: Secondary | ICD-10-CM | POA: Diagnosis not present

## 2021-02-06 DIAGNOSIS — Z992 Dependence on renal dialysis: Secondary | ICD-10-CM | POA: Diagnosis not present

## 2021-02-07 DIAGNOSIS — S065X9D Traumatic subdural hemorrhage with loss of consciousness of unspecified duration, subsequent encounter: Secondary | ICD-10-CM | POA: Diagnosis not present

## 2021-02-07 DIAGNOSIS — G40909 Epilepsy, unspecified, not intractable, without status epilepticus: Secondary | ICD-10-CM | POA: Diagnosis not present

## 2021-02-07 DIAGNOSIS — W19XXXD Unspecified fall, subsequent encounter: Secondary | ICD-10-CM | POA: Diagnosis not present

## 2021-02-07 DIAGNOSIS — F419 Anxiety disorder, unspecified: Secondary | ICD-10-CM | POA: Diagnosis not present

## 2021-02-07 DIAGNOSIS — N186 End stage renal disease: Secondary | ICD-10-CM | POA: Diagnosis not present

## 2021-02-07 DIAGNOSIS — I2510 Atherosclerotic heart disease of native coronary artery without angina pectoris: Secondary | ICD-10-CM | POA: Diagnosis not present

## 2021-02-09 DIAGNOSIS — D509 Iron deficiency anemia, unspecified: Secondary | ICD-10-CM | POA: Diagnosis not present

## 2021-02-09 DIAGNOSIS — N186 End stage renal disease: Secondary | ICD-10-CM | POA: Diagnosis not present

## 2021-02-09 DIAGNOSIS — Z992 Dependence on renal dialysis: Secondary | ICD-10-CM | POA: Diagnosis not present

## 2021-02-09 DIAGNOSIS — N2581 Secondary hyperparathyroidism of renal origin: Secondary | ICD-10-CM | POA: Diagnosis not present

## 2021-02-09 DIAGNOSIS — D631 Anemia in chronic kidney disease: Secondary | ICD-10-CM | POA: Diagnosis not present

## 2021-02-10 DIAGNOSIS — F419 Anxiety disorder, unspecified: Secondary | ICD-10-CM | POA: Diagnosis not present

## 2021-02-10 DIAGNOSIS — W19XXXD Unspecified fall, subsequent encounter: Secondary | ICD-10-CM | POA: Diagnosis not present

## 2021-02-10 DIAGNOSIS — N186 End stage renal disease: Secondary | ICD-10-CM | POA: Diagnosis not present

## 2021-02-10 DIAGNOSIS — G40909 Epilepsy, unspecified, not intractable, without status epilepticus: Secondary | ICD-10-CM | POA: Diagnosis not present

## 2021-02-10 DIAGNOSIS — S065X9D Traumatic subdural hemorrhage with loss of consciousness of unspecified duration, subsequent encounter: Secondary | ICD-10-CM | POA: Diagnosis not present

## 2021-02-10 DIAGNOSIS — I2510 Atherosclerotic heart disease of native coronary artery without angina pectoris: Secondary | ICD-10-CM | POA: Diagnosis not present

## 2021-02-11 DIAGNOSIS — N2581 Secondary hyperparathyroidism of renal origin: Secondary | ICD-10-CM | POA: Diagnosis not present

## 2021-02-11 DIAGNOSIS — D509 Iron deficiency anemia, unspecified: Secondary | ICD-10-CM | POA: Diagnosis not present

## 2021-02-11 DIAGNOSIS — Z992 Dependence on renal dialysis: Secondary | ICD-10-CM | POA: Diagnosis not present

## 2021-02-11 DIAGNOSIS — D631 Anemia in chronic kidney disease: Secondary | ICD-10-CM | POA: Diagnosis not present

## 2021-02-11 DIAGNOSIS — N186 End stage renal disease: Secondary | ICD-10-CM | POA: Diagnosis not present

## 2021-02-12 DIAGNOSIS — F419 Anxiety disorder, unspecified: Secondary | ICD-10-CM | POA: Diagnosis not present

## 2021-02-12 DIAGNOSIS — N186 End stage renal disease: Secondary | ICD-10-CM | POA: Diagnosis not present

## 2021-02-12 DIAGNOSIS — I2510 Atherosclerotic heart disease of native coronary artery without angina pectoris: Secondary | ICD-10-CM | POA: Diagnosis not present

## 2021-02-12 DIAGNOSIS — G40909 Epilepsy, unspecified, not intractable, without status epilepticus: Secondary | ICD-10-CM | POA: Diagnosis not present

## 2021-02-12 DIAGNOSIS — S065X9D Traumatic subdural hemorrhage with loss of consciousness of unspecified duration, subsequent encounter: Secondary | ICD-10-CM | POA: Diagnosis not present

## 2021-02-12 DIAGNOSIS — W19XXXD Unspecified fall, subsequent encounter: Secondary | ICD-10-CM | POA: Diagnosis not present

## 2021-02-13 DIAGNOSIS — N2581 Secondary hyperparathyroidism of renal origin: Secondary | ICD-10-CM | POA: Diagnosis not present

## 2021-02-13 DIAGNOSIS — N186 End stage renal disease: Secondary | ICD-10-CM | POA: Diagnosis not present

## 2021-02-13 DIAGNOSIS — D509 Iron deficiency anemia, unspecified: Secondary | ICD-10-CM | POA: Diagnosis not present

## 2021-02-13 DIAGNOSIS — D631 Anemia in chronic kidney disease: Secondary | ICD-10-CM | POA: Diagnosis not present

## 2021-02-13 DIAGNOSIS — Z992 Dependence on renal dialysis: Secondary | ICD-10-CM | POA: Diagnosis not present

## 2021-02-14 DIAGNOSIS — I2510 Atherosclerotic heart disease of native coronary artery without angina pectoris: Secondary | ICD-10-CM | POA: Diagnosis not present

## 2021-02-14 DIAGNOSIS — G40909 Epilepsy, unspecified, not intractable, without status epilepticus: Secondary | ICD-10-CM | POA: Diagnosis not present

## 2021-02-14 DIAGNOSIS — N186 End stage renal disease: Secondary | ICD-10-CM | POA: Diagnosis not present

## 2021-02-14 DIAGNOSIS — W19XXXD Unspecified fall, subsequent encounter: Secondary | ICD-10-CM | POA: Diagnosis not present

## 2021-02-14 DIAGNOSIS — F419 Anxiety disorder, unspecified: Secondary | ICD-10-CM | POA: Diagnosis not present

## 2021-02-14 DIAGNOSIS — S065X9D Traumatic subdural hemorrhage with loss of consciousness of unspecified duration, subsequent encounter: Secondary | ICD-10-CM | POA: Diagnosis not present

## 2021-02-16 DIAGNOSIS — Z992 Dependence on renal dialysis: Secondary | ICD-10-CM | POA: Diagnosis not present

## 2021-02-16 DIAGNOSIS — D631 Anemia in chronic kidney disease: Secondary | ICD-10-CM | POA: Diagnosis not present

## 2021-02-16 DIAGNOSIS — N2581 Secondary hyperparathyroidism of renal origin: Secondary | ICD-10-CM | POA: Diagnosis not present

## 2021-02-16 DIAGNOSIS — N186 End stage renal disease: Secondary | ICD-10-CM | POA: Diagnosis not present

## 2021-02-16 DIAGNOSIS — D509 Iron deficiency anemia, unspecified: Secondary | ICD-10-CM | POA: Diagnosis not present

## 2021-02-17 DIAGNOSIS — W19XXXD Unspecified fall, subsequent encounter: Secondary | ICD-10-CM | POA: Diagnosis not present

## 2021-02-17 DIAGNOSIS — N186 End stage renal disease: Secondary | ICD-10-CM | POA: Diagnosis not present

## 2021-02-17 DIAGNOSIS — S065X9D Traumatic subdural hemorrhage with loss of consciousness of unspecified duration, subsequent encounter: Secondary | ICD-10-CM | POA: Diagnosis not present

## 2021-02-17 DIAGNOSIS — G40909 Epilepsy, unspecified, not intractable, without status epilepticus: Secondary | ICD-10-CM | POA: Diagnosis not present

## 2021-02-17 DIAGNOSIS — I2510 Atherosclerotic heart disease of native coronary artery without angina pectoris: Secondary | ICD-10-CM | POA: Diagnosis not present

## 2021-02-17 DIAGNOSIS — F419 Anxiety disorder, unspecified: Secondary | ICD-10-CM | POA: Diagnosis not present

## 2021-02-18 DIAGNOSIS — N2581 Secondary hyperparathyroidism of renal origin: Secondary | ICD-10-CM | POA: Diagnosis not present

## 2021-02-18 DIAGNOSIS — Z992 Dependence on renal dialysis: Secondary | ICD-10-CM | POA: Diagnosis not present

## 2021-02-18 DIAGNOSIS — D509 Iron deficiency anemia, unspecified: Secondary | ICD-10-CM | POA: Diagnosis not present

## 2021-02-18 DIAGNOSIS — D631 Anemia in chronic kidney disease: Secondary | ICD-10-CM | POA: Diagnosis not present

## 2021-02-18 DIAGNOSIS — N186 End stage renal disease: Secondary | ICD-10-CM | POA: Diagnosis not present

## 2021-02-19 DIAGNOSIS — S065X9D Traumatic subdural hemorrhage with loss of consciousness of unspecified duration, subsequent encounter: Secondary | ICD-10-CM | POA: Diagnosis not present

## 2021-02-19 DIAGNOSIS — G40909 Epilepsy, unspecified, not intractable, without status epilepticus: Secondary | ICD-10-CM | POA: Diagnosis not present

## 2021-02-19 DIAGNOSIS — N186 End stage renal disease: Secondary | ICD-10-CM | POA: Diagnosis not present

## 2021-02-19 DIAGNOSIS — W19XXXD Unspecified fall, subsequent encounter: Secondary | ICD-10-CM | POA: Diagnosis not present

## 2021-02-19 DIAGNOSIS — I2510 Atherosclerotic heart disease of native coronary artery without angina pectoris: Secondary | ICD-10-CM | POA: Diagnosis not present

## 2021-02-19 DIAGNOSIS — F419 Anxiety disorder, unspecified: Secondary | ICD-10-CM | POA: Diagnosis not present

## 2021-02-20 DIAGNOSIS — F39 Unspecified mood [affective] disorder: Secondary | ICD-10-CM | POA: Diagnosis not present

## 2021-02-20 DIAGNOSIS — D631 Anemia in chronic kidney disease: Secondary | ICD-10-CM | POA: Diagnosis not present

## 2021-02-20 DIAGNOSIS — I2510 Atherosclerotic heart disease of native coronary artery without angina pectoris: Secondary | ICD-10-CM | POA: Diagnosis not present

## 2021-02-20 DIAGNOSIS — E785 Hyperlipidemia, unspecified: Secondary | ICD-10-CM | POA: Diagnosis not present

## 2021-02-20 DIAGNOSIS — N2581 Secondary hyperparathyroidism of renal origin: Secondary | ICD-10-CM | POA: Diagnosis not present

## 2021-02-20 DIAGNOSIS — G4452 New daily persistent headache (NDPH): Secondary | ICD-10-CM | POA: Diagnosis not present

## 2021-02-20 DIAGNOSIS — D509 Iron deficiency anemia, unspecified: Secondary | ICD-10-CM | POA: Diagnosis not present

## 2021-02-20 DIAGNOSIS — G479 Sleep disorder, unspecified: Secondary | ICD-10-CM | POA: Diagnosis not present

## 2021-02-20 DIAGNOSIS — N186 End stage renal disease: Secondary | ICD-10-CM | POA: Diagnosis not present

## 2021-02-20 DIAGNOSIS — G3184 Mild cognitive impairment, so stated: Secondary | ICD-10-CM | POA: Diagnosis not present

## 2021-02-20 DIAGNOSIS — K219 Gastro-esophageal reflux disease without esophagitis: Secondary | ICD-10-CM | POA: Diagnosis not present

## 2021-02-20 DIAGNOSIS — Z992 Dependence on renal dialysis: Secondary | ICD-10-CM | POA: Diagnosis not present

## 2021-02-20 DIAGNOSIS — I10 Essential (primary) hypertension: Secondary | ICD-10-CM | POA: Diagnosis not present

## 2021-02-20 DIAGNOSIS — G40909 Epilepsy, unspecified, not intractable, without status epilepticus: Secondary | ICD-10-CM | POA: Diagnosis not present

## 2021-02-21 DIAGNOSIS — I2510 Atherosclerotic heart disease of native coronary artery without angina pectoris: Secondary | ICD-10-CM | POA: Diagnosis not present

## 2021-02-21 DIAGNOSIS — W19XXXD Unspecified fall, subsequent encounter: Secondary | ICD-10-CM | POA: Diagnosis not present

## 2021-02-21 DIAGNOSIS — N186 End stage renal disease: Secondary | ICD-10-CM | POA: Diagnosis not present

## 2021-02-21 DIAGNOSIS — F419 Anxiety disorder, unspecified: Secondary | ICD-10-CM | POA: Diagnosis not present

## 2021-02-21 DIAGNOSIS — G40909 Epilepsy, unspecified, not intractable, without status epilepticus: Secondary | ICD-10-CM | POA: Diagnosis not present

## 2021-02-21 DIAGNOSIS — S065X9D Traumatic subdural hemorrhage with loss of consciousness of unspecified duration, subsequent encounter: Secondary | ICD-10-CM | POA: Diagnosis not present

## 2021-02-23 DIAGNOSIS — D631 Anemia in chronic kidney disease: Secondary | ICD-10-CM | POA: Diagnosis not present

## 2021-02-23 DIAGNOSIS — I7091 Generalized atherosclerosis: Secondary | ICD-10-CM | POA: Diagnosis not present

## 2021-02-23 DIAGNOSIS — L603 Nail dystrophy: Secondary | ICD-10-CM | POA: Diagnosis not present

## 2021-02-23 DIAGNOSIS — D509 Iron deficiency anemia, unspecified: Secondary | ICD-10-CM | POA: Diagnosis not present

## 2021-02-23 DIAGNOSIS — N185 Chronic kidney disease, stage 5: Secondary | ICD-10-CM | POA: Diagnosis not present

## 2021-02-23 DIAGNOSIS — N186 End stage renal disease: Secondary | ICD-10-CM | POA: Diagnosis not present

## 2021-02-23 DIAGNOSIS — N2581 Secondary hyperparathyroidism of renal origin: Secondary | ICD-10-CM | POA: Diagnosis not present

## 2021-02-23 DIAGNOSIS — Z992 Dependence on renal dialysis: Secondary | ICD-10-CM | POA: Diagnosis not present

## 2021-02-23 NOTE — Progress Notes (Signed)
Cardiology Office Note  Date:  02/24/2021   ID:  TANESIA BUTNER, DOB [birth year], MRN [id number]  PCP:  Murlean Iba, MD   Chief Complaint  Patient presents with   [facility] Follow up     "Doing well." Medications reviewed by the patient's medication list from [facility].     HPI:  Mrs. Cederberg is a 70 year old woman with history of  Hypertension End-stage renal disease on hemodialysis Monday Wednesday Friday Anemia Coronary artery disease, stent placed February 2022 Fall, subdural hematoma status postevacuation (07/25/2020), seizure disorder Debility, living at [facility] skilled nursing Who presents for follow-up of her  essential hypertension, new leg swelling  Last seen in clinic by myself December 2019 Significant things have happened over the past 3 years since I last seen her, in brief  Has end-stage renal disease, on hemodialysis Has been evaluated for pretransplant June 2021  Echocardiogram, ejection fraction 25 to 30% Cardiac catheterization feb23rd 2022 Severe multivessel CAD with diffuse 80%, 95% and 80% proximal LAD stenosis, 90% diagonal stenosis;  85% focal ramus intermediate stenosis;  and 50% stenoses in the AV groove circumflex.   The right coronary artery is a dominant normal vessel.  Stent placement 2 days later Successful OCT guided PCI of severe complex stenosis in the proximal LAD, reducing 90% stenosis to 0% with a 2.75x22 mm Resolute Onyx DES Lesion MLA 1.65 square mm, minimal calcification Post-PCI: Minimal stent area 4.93 square mm, full stent expansion and apposition Residual diagonal disease, OM disease, 50% circumflex disease  Episode of syncope during dialysis August 2022 Hypotension from acute cystitis with hematuria, severe leukocytosis noted, treated with broad-spectrum antibiotics  Does HD on Mon/Wed/Fri  BP elevated today,, Significant leg swelling, she is on amlodipine Significant ecchymotic bruising legs, arms, only on warfarin 1 mg  daily, not on aspirin or Plavix here  Reports debility, walks with a walker, presents today in wheelchair  EKG personally reviewed by myself on todays visit shows normal sinus rhythm  with rate 77 bpm with no significant ST or T-wave changes  Other past medical history reviewed Previous chest pain with atypical in nature, possible anxiety    PMH:   has a past medical history of Anemia, Anxiety, Closed fracture of shaft of left ulna (11/27/2018), Colon polyp, ESRD on peritoneal dialysis ([place]), Gastritis, GERD (gastroesophageal reflux disease), History of chicken pox, History of measles as a child, Hypertension, Hyponatremia, Leukocytosis, Mesenteric lymphadenopathy (04/08/2017), and Multiple thyroid nodules.  PSH:    Past Surgical History:  Procedure Laterality Date   ABDOMINAL HYSTERECTOMY  2003   due to heavy periods and clotting during menses   BREAST LUMPECTOMY Left 1990   CHOLECYSTECTOMY  2010   COLONOSCOPY WITH PROPOFOL N/A 09/07/2016   Procedure: COLONOSCOPY WITH PROPOFOL;  Surgeon: Lucilla Lame, MD;  Location: [facility] ENDOSCOPY;  Service: Endoscopy;  Laterality: N/A;   CORONARY STENT INTERVENTION N/A 06/27/2020   Procedure: CORONARY STENT INTERVENTION;  Surgeon: Sherren Mocha, MD;  Location: [place] CV [facility];  Service: Cardiovascular;  Laterality: N/A;   DIALYSIS/PERMA CATHETER INSERTION N/A 11/16/2018   Procedure: DIALYSIS/PERMA CATHETER INSERTION;  Surgeon: Algernon Huxley, MD;  Location: [place] CV LAB;  Service: Cardiovascular;  Laterality: N/A;   DIALYSIS/PERMA CATHETER INSERTION N/A 11/10/2020   Procedure: DIALYSIS/PERMA CATHETER EXCHANGE;  Surgeon: Algernon Huxley, MD;  Location: [place] [facility];  Service: Cardiovascular;  Laterality: N/A;   DIALYSIS/PERMA CATHETER INSERTION N/A 12/24/2020   Procedure: DIALYSIS/PERMA CATHETER INSERTION / EXCHANGE;  Surgeon: Leotis Pain  S, MD;  Location: [place] [facility];  Service: Cardiovascular;  Laterality: N/A;   DIALYSIS/PERMA  CATHETER INSERTION N/A 01/19/2021   Procedure: DIALYSIS/PERMA CATHETER INSERTION;  Surgeon: Algernon Huxley, MD;  Location: [place] [facility];  Service: Cardiovascular;  Laterality: N/A;   DIALYSIS/PERMA CATHETER REMOVAL N/A 08/16/2019   Procedure: DIALYSIS/PERMA CATHETER REMOVAL;  Surgeon: Algernon Huxley, MD;  Location: [place] CV [facility];  Service: Cardiovascular;  Laterality: N/A;   DILATION AND CURETTAGE OF UTERUS  1985   ESOPHAGOGASTRODUODENOSCOPY (EGD) WITH PROPOFOL N/A 09/07/2016   Procedure: ESOPHAGOGASTRODUODENOSCOPY (EGD) WITH PROPOFOL;  Surgeon: Lucilla Lame, MD;  Location: [facility] ENDOSCOPY;  Service: Endoscopy;  Laterality: N/A;   GALLBLADDER SURGERY  2012   INTRAMEDULLARY (IM) NAIL INTERTROCHANTERIC Right 04/23/2020   Procedure: INTRAMEDULLARY (IM) NAIL INTERTROCHANTRIC;  Surgeon: Thornton Park, MD;  Location: [facility] ORS;  Service: Orthopedics;  Laterality: Right;   INTRAVASCULAR IMAGING/OCT N/A 06/27/2020   Procedure: INTRAVASCULAR IMAGING/OCT;  Surgeon: Sherren Mocha, MD;  Location: [place] [facility];  Service: Cardiovascular;  Laterality: N/A;   IR FLUORO GUIDE CV LINE RIGHT  09/04/2020   IR US GUIDE VASC ACCESS RIGHT  09/04/2020   KNEE SURGERY     Dr. Tamala Julian   LEFT HEART CATH AND CORONARY ANGIOGRAPHY N/A 06/25/2020   Procedure: LEFT HEART CATH AND CORONARY ANGIOGRAPHY;  Surgeon: Troy Sine, MD;  Location: [employer] [facility];  Service: Cardiovascular;  Laterality: N/A;   TONSILLECTOMY     TONSILLECTOMY AND ADENOIDECTOMY  1970   UPPER GI ENDOSCOPY  03/02/2006   H. Pylori negative; Dr. Allen Norris    Current Outpatient Medications  Medication Sig Dispense Refill   acetaminophen (TYLENOL) 325 MG tablet Take 1-2 tablets (325-650 mg total) by mouth every 4 (four) hours as needed for mild pain. (Patient taking differently: Take 325-650 mg by mouth as needed for mild pain, fever or headache.)     amLODipine (NORVASC) 5 MG tablet Take 5 mg by mouth daily.     atorvastatin (LIPITOR) 40 MG  tablet Take 1 tablet (40 mg total) by mouth daily.     bismuth subsalicylate (KAOPECTATE) 262 MG/15ML suspension Take 30 mLs by mouth as needed for diarrhea or loose stools.     brivaracetam (BRIVIACT) 25 MG TABS tablet Take 3 tablets (75 mg total) by mouth 2 (two) times daily. 60 tablet    calcium carbonate (TUMS EX) 750 MG chewable tablet Chew 1-2 tablets by mouth as needed for heartburn.     camphor-menthol (SARNA) lotion Apply topically as needed for itching. 222 mL 0   Chlorhexidine Gluconate Cloth 2 % PADS Apply 6 each topically daily.     Darbepoetin Alfa (ARANESP) 60 MCG/0.3ML SOSY injection Inject 0.3 mLs (60 mcg total) into the vein every Saturday with hemodialysis. 4.2 mL    diphenhydrAMINE (BENADRYL) 25 mg capsule Take 25-50 mg by mouth every 6 (six) hours as needed for itching or allergies.     docusate sodium (COLACE) 100 MG capsule Take 1 capsule (100 mg total) by mouth 2 (two) times daily. 10 capsule 0   hydrOXYzine (ATARAX/VISTARIL) 10 MG tablet Take 10 mg by mouth 3 (three) times daily.     lacosamide (VIMPAT) 50 MG TABS tablet Take 1 tablet (50 mg total) by mouth 2 (two) times daily. 60 tablet    loperamide (IMODIUM) 2 MG capsule Take 2 mg by mouth as needed for diarrhea or loose stools. May repeat after each loose stool X 2.     melatonin  5 MG TABS Take 1 tablet (5 mg total) by mouth at bedtime.  0   methocarbamol (ROBAXIN) 500 MG tablet Take 1 tablet (500 mg total) by mouth every 6 (six) hours as needed for muscle spasms.     metoprolol tartrate (LOPRESSOR) 25 MG tablet Take 12.5 mg by mouth 2 (two) times daily.     multivitamin (RENA-VIT) TABS tablet Take 1 tablet by mouth at bedtime.  0   nitroGLYCERIN (NITROSTAT) 0.4 MG SL tablet Place 0.4 mg under the tongue every 5 (five) minutes as needed for chest pain. X 2 more doses.     ondansetron (ZOFRAN) 4 MG tablet Take 4 mg by mouth every 8 (eight) hours as needed.     pantoprazole (PROTONIX) 40 MG tablet TAKE 1 TABLET (40 MG  TOTAL) BY MOUTH DAILY. 30 tablet 1   sertraline (ZOLOFT) 50 MG tablet Take 50 mg by mouth daily.     traZODone (DESYREL) 50 MG tablet Take 0.5-1 tablets (25-50 mg total) by mouth at bedtime as needed for sleep. (Patient taking differently: Take 25 mg by mouth at bedtime as needed for sleep.)     triamcinolone cream (KENALOG) 0.1 % Apply topically 3 (three) times daily. 30 g 0   warfarin (COUMADIN) 1 MG tablet Take 1 mg by mouth daily.     No current facility-administered medications for this visit.     Allergies:   Cephalosporins, Clarithromycin, Ensure [nutritional supplements], Gabapentin, Lunesta  [eszopiclone], Nepro [compleat], Penicillin v, and Sulfa antibiotics   Social History:  The patient  reports that she has never smoked. She has never used smokeless tobacco. She reports current alcohol use. She reports that she does not use drugs.   Family History:   family history includes Cancer in her paternal grandfather; Congestive Heart Failure in her maternal grandmother; Heart disease in her maternal uncle; Pancreatic cancer in her father.    Review of Systems: Review of Systems  Constitutional:  Positive for malaise/fatigue.  Respiratory: Negative.    Cardiovascular:  Positive for leg swelling.  Gastrointestinal: Negative.   Musculoskeletal: Negative.   Neurological: Negative.   Psychiatric/Behavioral: Negative.    All other systems reviewed and are negative.   PHYSICAL EXAM: VS:  BP (!) 170/86 (BP Location: Left Arm, Patient Position: Sitting, Cuff Size: Normal)   Pulse 77   Ht 5\' 4"  (1.626 m)   SpO2 97%   BMI 19.87 kg/m  , BMI Body mass index is 19.87 kg/m. Constitutional:  Presents in a wheelchair, thin HENT:  Head: Grossly normal Eyes:  no discharge. No scleral icterus.  Neck: No JVD, no carotid bruits  Cardiovascular: Regular rate and rhythm, no murmurs appreciated 1+ pitting edema bilateral lower extremity left greater than right Pulmonary/Chest: Clear to  auscultation bilaterally, no wheezes or rails Abdominal: Soft.  no distension.  no tenderness.  Musculoskeletal: Normal range of motion Skin significant ecchymotic bruising legs bilaterally Neurological:  normal muscle tone. Coordination normal. No atrophy Psychiatric: normal affect, pleasant  Recent Labs: 06/22/2020: TSH 4.334 12/22/2020: ALT 15; B Natriuretic Peptide 618.0; Magnesium 1.7 12/24/2020: BUN 20; Creatinine, Ser 3.34; Hemoglobin 12.9; Platelets 251; Potassium 3.1; Sodium 127    Lipid Panel Lab Results  Component Value Date   CHOL 170 06/24/2020   HDL 88 06/24/2020   LDLCALC 68 06/24/2020   TRIG 68 06/24/2020      Wt Readings from Last 3 Encounters:  01/19/21 115 lb 11.9 oz (52.5 kg)  12/24/20 115 lb 11.9 oz (52.5 kg)  11/13/20 123 lb 14.4 oz (56.2 kg)      ASSESSMENT AND PLAN:   Chronic kidney disease (CKD), active medical management without dialysis, stage 3 (moderate) - Followed by  Dr. Fredna Dow history of poorly controlled hypertension Now on dialysis  Lower extremity edema Hold amlodipine, we recommended this in 2019 Blood pressure management as below  Essential hypertension - Plan: EKG 12-Lead 2019 we stopped amlodipine and was titrating up on clonidine, she is not on clonidine at this time Recommend she start hydralazine 25 mg 3 times daily for now with extra hydralazine 25 for blood pressure over 160 Suspect she will need more medication titration Additional changes can be made by nephrology and our office as well as primary care and Dr. Silvio Pate  Chronic gastritis without bleeding, unspecified gastritis type - Plan: EKG 12-Lead Prior history, previously treated with PPI  Anemia in stage 3 chronic kidney disease Previously on EPO, managed by nephrology  Coronary artery disease with stable angina Not on aspirin or Plavix presumably secondary to severe ecchymotic bruising, prior falls, subdural hematoma -Will not start aspirin at this time given  significant ecchymotic bruising legs arms, continue warfarin 1 mg which presumably was started for AV graft patency -Add Zetia to her Lipitor, goal LDL less than 70    No orders of the defined types were placed in this encounter.    Signed, Esmond Plants, M.D., Ph.D. 02/24/2021  [place], [place]

## 2021-02-24 ENCOUNTER — Ambulatory Visit (INDEPENDENT_AMBULATORY_CARE_PROVIDER_SITE_OTHER): Payer: Medicare Other | Admitting: Cardiovascular Disease

## 2021-02-24 ENCOUNTER — Encounter: Payer: Self-pay | Admitting: Cardiovascular Disease

## 2021-02-24 ENCOUNTER — Other Ambulatory Visit: Payer: Self-pay

## 2021-02-24 VITALS — BP 170/86 | HR 77 | Ht 64.0 in

## 2021-02-24 DIAGNOSIS — I10 Essential (primary) hypertension: Secondary | ICD-10-CM | POA: Diagnosis not present

## 2021-02-24 DIAGNOSIS — N186 End stage renal disease: Secondary | ICD-10-CM | POA: Diagnosis not present

## 2021-02-24 DIAGNOSIS — W19XXXD Unspecified fall, subsequent encounter: Secondary | ICD-10-CM | POA: Diagnosis not present

## 2021-02-24 DIAGNOSIS — I483 Typical atrial flutter: Secondary | ICD-10-CM

## 2021-02-24 DIAGNOSIS — I2510 Atherosclerotic heart disease of native coronary artery without angina pectoris: Secondary | ICD-10-CM | POA: Diagnosis not present

## 2021-02-24 DIAGNOSIS — E782 Mixed hyperlipidemia: Secondary | ICD-10-CM

## 2021-02-24 DIAGNOSIS — G40909 Epilepsy, unspecified, not intractable, without status epilepticus: Secondary | ICD-10-CM | POA: Diagnosis not present

## 2021-02-24 DIAGNOSIS — S065X9D Traumatic subdural hemorrhage with loss of consciousness of unspecified duration, subsequent encounter: Secondary | ICD-10-CM | POA: Diagnosis not present

## 2021-02-24 DIAGNOSIS — F419 Anxiety disorder, unspecified: Secondary | ICD-10-CM | POA: Diagnosis not present

## 2021-02-24 DIAGNOSIS — I4891 Unspecified atrial fibrillation: Secondary | ICD-10-CM

## 2021-02-24 MED ORDER — EZETIMIBE 10 MG PO TABS: 10.0000 mg | ORAL_TABLET | Freq: Every day | ORAL | 3 refills | Status: DC

## 2021-02-24 MED ORDER — HYDRALAZINE HCL 25 MG PO TABS: 25.0000 mg | ORAL_TABLET | Freq: Three times a day (TID) | ORAL | 3 refills | Status: DC

## 2021-02-24 NOTE — Patient Instructions (Addendum)
Medication Instructions:  Please STOP  amlodipine (could contribute to leg swelling)  Please START zetia 10 mg daily (cholesterol above goal) hydralazine 25 mg three times a day (for blood pressure) Extra hydralazine 25 mg as needed for SBP >160  3. Ace wraps once daily for 30 min to 1 hour A. b/l lower extremities for leg swelling  If you need a refill on your cardiac medications before your next appointment, please call your pharmacy.    Lab work: No new labs needed  Testing/Procedures: No new testing needed  Follow-Up: At [facility], you and your health needs are our priority.  As part of our continuing mission to provide you with exceptional heart care, we have created designated Provider Care Teams.  These Care Teams include your primary Cardiologist (physician) and Advanced Practice Providers (APPs -  Physician Assistants and Nurse Practitioners) who all work together to provide you with the care you need, when you need it.  You will need a follow up appointment in 12 months  Providers on your designated Care Team:   Murray Hodgkins, NP Christell Faith, PA-C Cadence Kathlen Mody, [place]  COVID-19 Vaccine Information can be found at: ShippingScam.co.uk For questions related to vaccine distribution or appointments, please email vaccine@[email or web address] .com or call [phone].

## 2021-02-25 DIAGNOSIS — D509 Iron deficiency anemia, unspecified: Secondary | ICD-10-CM | POA: Diagnosis not present

## 2021-02-25 DIAGNOSIS — N2581 Secondary hyperparathyroidism of renal origin: Secondary | ICD-10-CM | POA: Diagnosis not present

## 2021-02-25 DIAGNOSIS — D631 Anemia in chronic kidney disease: Secondary | ICD-10-CM | POA: Diagnosis not present

## 2021-02-25 DIAGNOSIS — Z992 Dependence on renal dialysis: Secondary | ICD-10-CM | POA: Diagnosis not present

## 2021-02-25 DIAGNOSIS — N186 End stage renal disease: Secondary | ICD-10-CM | POA: Diagnosis not present

## 2021-02-26 DIAGNOSIS — N186 End stage renal disease: Secondary | ICD-10-CM | POA: Diagnosis not present

## 2021-02-26 DIAGNOSIS — F419 Anxiety disorder, unspecified: Secondary | ICD-10-CM | POA: Diagnosis not present

## 2021-02-26 DIAGNOSIS — I2510 Atherosclerotic heart disease of native coronary artery without angina pectoris: Secondary | ICD-10-CM | POA: Diagnosis not present

## 2021-02-26 DIAGNOSIS — W19XXXD Unspecified fall, subsequent encounter: Secondary | ICD-10-CM | POA: Diagnosis not present

## 2021-02-26 DIAGNOSIS — G40909 Epilepsy, unspecified, not intractable, without status epilepticus: Secondary | ICD-10-CM | POA: Diagnosis not present

## 2021-02-26 DIAGNOSIS — S065X9D Traumatic subdural hemorrhage with loss of consciousness of unspecified duration, subsequent encounter: Secondary | ICD-10-CM | POA: Diagnosis not present

## 2021-02-27 DIAGNOSIS — D509 Iron deficiency anemia, unspecified: Secondary | ICD-10-CM | POA: Diagnosis not present

## 2021-02-27 DIAGNOSIS — D631 Anemia in chronic kidney disease: Secondary | ICD-10-CM | POA: Diagnosis not present

## 2021-02-27 DIAGNOSIS — N186 End stage renal disease: Secondary | ICD-10-CM | POA: Diagnosis not present

## 2021-02-27 DIAGNOSIS — N2581 Secondary hyperparathyroidism of renal origin: Secondary | ICD-10-CM | POA: Diagnosis not present

## 2021-02-27 DIAGNOSIS — Z992 Dependence on renal dialysis: Secondary | ICD-10-CM | POA: Diagnosis not present

## 2021-02-28 DIAGNOSIS — I2510 Atherosclerotic heart disease of native coronary artery without angina pectoris: Secondary | ICD-10-CM | POA: Diagnosis not present

## 2021-02-28 DIAGNOSIS — G40909 Epilepsy, unspecified, not intractable, without status epilepticus: Secondary | ICD-10-CM | POA: Diagnosis not present

## 2021-02-28 DIAGNOSIS — W19XXXD Unspecified fall, subsequent encounter: Secondary | ICD-10-CM | POA: Diagnosis not present

## 2021-02-28 DIAGNOSIS — N186 End stage renal disease: Secondary | ICD-10-CM | POA: Diagnosis not present

## 2021-02-28 DIAGNOSIS — S065X9D Traumatic subdural hemorrhage with loss of consciousness of unspecified duration, subsequent encounter: Secondary | ICD-10-CM | POA: Diagnosis not present

## 2021-02-28 DIAGNOSIS — F419 Anxiety disorder, unspecified: Secondary | ICD-10-CM | POA: Diagnosis not present

## 2021-03-02 DIAGNOSIS — N186 End stage renal disease: Secondary | ICD-10-CM | POA: Diagnosis not present

## 2021-03-02 DIAGNOSIS — D509 Iron deficiency anemia, unspecified: Secondary | ICD-10-CM | POA: Diagnosis not present

## 2021-03-02 DIAGNOSIS — D631 Anemia in chronic kidney disease: Secondary | ICD-10-CM | POA: Diagnosis not present

## 2021-03-02 DIAGNOSIS — N2581 Secondary hyperparathyroidism of renal origin: Secondary | ICD-10-CM | POA: Diagnosis not present

## 2021-03-02 DIAGNOSIS — Z992 Dependence on renal dialysis: Secondary | ICD-10-CM | POA: Diagnosis not present

## 2021-03-03 DIAGNOSIS — N186 End stage renal disease: Secondary | ICD-10-CM | POA: Diagnosis not present

## 2021-03-03 DIAGNOSIS — F419 Anxiety disorder, unspecified: Secondary | ICD-10-CM | POA: Diagnosis not present

## 2021-03-03 DIAGNOSIS — A0472 Enterocolitis due to Clostridium difficile, not specified as recurrent: Secondary | ICD-10-CM | POA: Diagnosis not present

## 2021-03-03 DIAGNOSIS — S065X9D Traumatic subdural hemorrhage with loss of consciousness of unspecified duration, subsequent encounter: Secondary | ICD-10-CM | POA: Diagnosis not present

## 2021-03-03 DIAGNOSIS — R2689 Other abnormalities of gait and mobility: Secondary | ICD-10-CM | POA: Diagnosis not present

## 2021-03-03 DIAGNOSIS — G40909 Epilepsy, unspecified, not intractable, without status epilepticus: Secondary | ICD-10-CM | POA: Diagnosis not present

## 2021-03-03 DIAGNOSIS — I2510 Atherosclerotic heart disease of native coronary artery without angina pectoris: Secondary | ICD-10-CM | POA: Diagnosis not present

## 2021-03-03 DIAGNOSIS — R278 Other lack of coordination: Secondary | ICD-10-CM | POA: Diagnosis not present

## 2021-03-03 DIAGNOSIS — M6281 Muscle weakness (generalized): Secondary | ICD-10-CM | POA: Diagnosis not present

## 2021-03-03 DIAGNOSIS — S065X0D Traumatic subdural hemorrhage without loss of consciousness, subsequent encounter: Secondary | ICD-10-CM | POA: Diagnosis not present

## 2021-03-03 DIAGNOSIS — W19XXXD Unspecified fall, subsequent encounter: Secondary | ICD-10-CM | POA: Diagnosis not present

## 2021-03-03 DIAGNOSIS — R4189 Other symptoms and signs involving cognitive functions and awareness: Secondary | ICD-10-CM | POA: Diagnosis not present

## 2021-03-03 DIAGNOSIS — Z741 Need for assistance with personal care: Secondary | ICD-10-CM | POA: Diagnosis not present

## 2021-03-04 DIAGNOSIS — Z992 Dependence on renal dialysis: Secondary | ICD-10-CM | POA: Diagnosis not present

## 2021-03-04 DIAGNOSIS — N186 End stage renal disease: Secondary | ICD-10-CM | POA: Diagnosis not present

## 2021-03-04 DIAGNOSIS — D631 Anemia in chronic kidney disease: Secondary | ICD-10-CM | POA: Diagnosis not present

## 2021-03-04 DIAGNOSIS — D509 Iron deficiency anemia, unspecified: Secondary | ICD-10-CM | POA: Diagnosis not present

## 2021-03-05 DIAGNOSIS — I2510 Atherosclerotic heart disease of native coronary artery without angina pectoris: Secondary | ICD-10-CM | POA: Diagnosis not present

## 2021-03-05 DIAGNOSIS — H3582 Retinal ischemia: Secondary | ICD-10-CM | POA: Diagnosis not present

## 2021-03-05 DIAGNOSIS — E113493 Type 2 diabetes mellitus with severe nonproliferative diabetic retinopathy without macular edema, bilateral: Secondary | ICD-10-CM | POA: Diagnosis not present

## 2021-03-05 DIAGNOSIS — H524 Presbyopia: Secondary | ICD-10-CM | POA: Diagnosis not present

## 2021-03-05 DIAGNOSIS — F419 Anxiety disorder, unspecified: Secondary | ICD-10-CM | POA: Diagnosis not present

## 2021-03-05 DIAGNOSIS — N186 End stage renal disease: Secondary | ICD-10-CM | POA: Diagnosis not present

## 2021-03-05 DIAGNOSIS — H2513 Age-related nuclear cataract, bilateral: Secondary | ICD-10-CM | POA: Diagnosis not present

## 2021-03-05 DIAGNOSIS — G40909 Epilepsy, unspecified, not intractable, without status epilepticus: Secondary | ICD-10-CM | POA: Diagnosis not present

## 2021-03-05 DIAGNOSIS — W19XXXD Unspecified fall, subsequent encounter: Secondary | ICD-10-CM | POA: Diagnosis not present

## 2021-03-05 DIAGNOSIS — S065X9D Traumatic subdural hemorrhage with loss of consciousness of unspecified duration, subsequent encounter: Secondary | ICD-10-CM | POA: Diagnosis not present

## 2021-03-06 DIAGNOSIS — D509 Iron deficiency anemia, unspecified: Secondary | ICD-10-CM | POA: Diagnosis not present

## 2021-03-06 DIAGNOSIS — D631 Anemia in chronic kidney disease: Secondary | ICD-10-CM | POA: Diagnosis not present

## 2021-03-06 DIAGNOSIS — Z992 Dependence on renal dialysis: Secondary | ICD-10-CM | POA: Diagnosis not present

## 2021-03-06 DIAGNOSIS — N186 End stage renal disease: Secondary | ICD-10-CM | POA: Diagnosis not present

## 2021-03-09 DIAGNOSIS — E109 Type 1 diabetes mellitus without complications: Secondary | ICD-10-CM | POA: Diagnosis not present

## 2021-03-09 DIAGNOSIS — N186 End stage renal disease: Secondary | ICD-10-CM | POA: Diagnosis not present

## 2021-03-09 DIAGNOSIS — D509 Iron deficiency anemia, unspecified: Secondary | ICD-10-CM | POA: Diagnosis not present

## 2021-03-09 DIAGNOSIS — Z992 Dependence on renal dialysis: Secondary | ICD-10-CM | POA: Diagnosis not present

## 2021-03-09 DIAGNOSIS — D631 Anemia in chronic kidney disease: Secondary | ICD-10-CM | POA: Diagnosis not present

## 2021-03-10 DIAGNOSIS — S065X9D Traumatic subdural hemorrhage with loss of consciousness of unspecified duration, subsequent encounter: Secondary | ICD-10-CM | POA: Diagnosis not present

## 2021-03-10 DIAGNOSIS — N186 End stage renal disease: Secondary | ICD-10-CM | POA: Diagnosis not present

## 2021-03-10 DIAGNOSIS — W19XXXD Unspecified fall, subsequent encounter: Secondary | ICD-10-CM | POA: Diagnosis not present

## 2021-03-10 DIAGNOSIS — I2510 Atherosclerotic heart disease of native coronary artery without angina pectoris: Secondary | ICD-10-CM | POA: Diagnosis not present

## 2021-03-10 DIAGNOSIS — F419 Anxiety disorder, unspecified: Secondary | ICD-10-CM | POA: Diagnosis not present

## 2021-03-10 DIAGNOSIS — G40909 Epilepsy, unspecified, not intractable, without status epilepticus: Secondary | ICD-10-CM | POA: Diagnosis not present

## 2021-03-11 DIAGNOSIS — D509 Iron deficiency anemia, unspecified: Secondary | ICD-10-CM | POA: Diagnosis not present

## 2021-03-11 DIAGNOSIS — D631 Anemia in chronic kidney disease: Secondary | ICD-10-CM | POA: Diagnosis not present

## 2021-03-11 DIAGNOSIS — N186 End stage renal disease: Secondary | ICD-10-CM | POA: Diagnosis not present

## 2021-03-11 DIAGNOSIS — Z992 Dependence on renal dialysis: Secondary | ICD-10-CM | POA: Diagnosis not present

## 2021-03-12 DIAGNOSIS — F419 Anxiety disorder, unspecified: Secondary | ICD-10-CM | POA: Diagnosis not present

## 2021-03-12 DIAGNOSIS — I2510 Atherosclerotic heart disease of native coronary artery without angina pectoris: Secondary | ICD-10-CM | POA: Diagnosis not present

## 2021-03-12 DIAGNOSIS — N186 End stage renal disease: Secondary | ICD-10-CM | POA: Diagnosis not present

## 2021-03-12 DIAGNOSIS — W19XXXD Unspecified fall, subsequent encounter: Secondary | ICD-10-CM | POA: Diagnosis not present

## 2021-03-12 DIAGNOSIS — S065X9D Traumatic subdural hemorrhage with loss of consciousness of unspecified duration, subsequent encounter: Secondary | ICD-10-CM | POA: Diagnosis not present

## 2021-03-12 DIAGNOSIS — G40909 Epilepsy, unspecified, not intractable, without status epilepticus: Secondary | ICD-10-CM | POA: Diagnosis not present

## 2021-03-13 DIAGNOSIS — N186 End stage renal disease: Secondary | ICD-10-CM | POA: Diagnosis not present

## 2021-03-13 DIAGNOSIS — D509 Iron deficiency anemia, unspecified: Secondary | ICD-10-CM | POA: Diagnosis not present

## 2021-03-13 DIAGNOSIS — D631 Anemia in chronic kidney disease: Secondary | ICD-10-CM | POA: Diagnosis not present

## 2021-03-13 DIAGNOSIS — Z992 Dependence on renal dialysis: Secondary | ICD-10-CM | POA: Diagnosis not present

## 2021-03-16 DIAGNOSIS — N186 End stage renal disease: Secondary | ICD-10-CM | POA: Diagnosis not present

## 2021-03-16 DIAGNOSIS — D509 Iron deficiency anemia, unspecified: Secondary | ICD-10-CM | POA: Diagnosis not present

## 2021-03-16 DIAGNOSIS — Z992 Dependence on renal dialysis: Secondary | ICD-10-CM | POA: Diagnosis not present

## 2021-03-16 DIAGNOSIS — D631 Anemia in chronic kidney disease: Secondary | ICD-10-CM | POA: Diagnosis not present

## 2021-03-17 DIAGNOSIS — F419 Anxiety disorder, unspecified: Secondary | ICD-10-CM | POA: Diagnosis not present

## 2021-03-17 DIAGNOSIS — H903 Sensorineural hearing loss, bilateral: Secondary | ICD-10-CM | POA: Diagnosis not present

## 2021-03-17 DIAGNOSIS — G40909 Epilepsy, unspecified, not intractable, without status epilepticus: Secondary | ICD-10-CM | POA: Diagnosis not present

## 2021-03-17 DIAGNOSIS — I2510 Atherosclerotic heart disease of native coronary artery without angina pectoris: Secondary | ICD-10-CM | POA: Diagnosis not present

## 2021-03-17 DIAGNOSIS — N186 End stage renal disease: Secondary | ICD-10-CM | POA: Diagnosis not present

## 2021-03-17 DIAGNOSIS — W19XXXD Unspecified fall, subsequent encounter: Secondary | ICD-10-CM | POA: Diagnosis not present

## 2021-03-17 DIAGNOSIS — S065X9D Traumatic subdural hemorrhage with loss of consciousness of unspecified duration, subsequent encounter: Secondary | ICD-10-CM | POA: Diagnosis not present

## 2021-03-18 DIAGNOSIS — Z992 Dependence on renal dialysis: Secondary | ICD-10-CM | POA: Diagnosis not present

## 2021-03-18 DIAGNOSIS — D509 Iron deficiency anemia, unspecified: Secondary | ICD-10-CM | POA: Diagnosis not present

## 2021-03-18 DIAGNOSIS — D631 Anemia in chronic kidney disease: Secondary | ICD-10-CM | POA: Diagnosis not present

## 2021-03-18 DIAGNOSIS — N186 End stage renal disease: Secondary | ICD-10-CM | POA: Diagnosis not present

## 2021-03-20 DIAGNOSIS — D631 Anemia in chronic kidney disease: Secondary | ICD-10-CM | POA: Diagnosis not present

## 2021-03-20 DIAGNOSIS — N186 End stage renal disease: Secondary | ICD-10-CM | POA: Diagnosis not present

## 2021-03-20 DIAGNOSIS — D509 Iron deficiency anemia, unspecified: Secondary | ICD-10-CM | POA: Diagnosis not present

## 2021-03-20 DIAGNOSIS — Z992 Dependence on renal dialysis: Secondary | ICD-10-CM | POA: Diagnosis not present

## 2021-03-22 DIAGNOSIS — N186 End stage renal disease: Secondary | ICD-10-CM | POA: Diagnosis not present

## 2021-03-22 DIAGNOSIS — S065X9D Traumatic subdural hemorrhage with loss of consciousness of unspecified duration, subsequent encounter: Secondary | ICD-10-CM | POA: Diagnosis not present

## 2021-03-22 DIAGNOSIS — I2510 Atherosclerotic heart disease of native coronary artery without angina pectoris: Secondary | ICD-10-CM | POA: Diagnosis not present

## 2021-03-22 DIAGNOSIS — W19XXXD Unspecified fall, subsequent encounter: Secondary | ICD-10-CM | POA: Diagnosis not present

## 2021-03-22 DIAGNOSIS — F419 Anxiety disorder, unspecified: Secondary | ICD-10-CM | POA: Diagnosis not present

## 2021-03-22 DIAGNOSIS — G40909 Epilepsy, unspecified, not intractable, without status epilepticus: Secondary | ICD-10-CM | POA: Diagnosis not present

## 2021-03-23 DIAGNOSIS — D509 Iron deficiency anemia, unspecified: Secondary | ICD-10-CM | POA: Diagnosis not present

## 2021-03-23 DIAGNOSIS — Z992 Dependence on renal dialysis: Secondary | ICD-10-CM | POA: Diagnosis not present

## 2021-03-23 DIAGNOSIS — N186 End stage renal disease: Secondary | ICD-10-CM | POA: Diagnosis not present

## 2021-03-23 DIAGNOSIS — D631 Anemia in chronic kidney disease: Secondary | ICD-10-CM | POA: Diagnosis not present

## 2021-03-24 DIAGNOSIS — S065X9D Traumatic subdural hemorrhage with loss of consciousness of unspecified duration, subsequent encounter: Secondary | ICD-10-CM | POA: Diagnosis not present

## 2021-03-24 DIAGNOSIS — W19XXXD Unspecified fall, subsequent encounter: Secondary | ICD-10-CM | POA: Diagnosis not present

## 2021-03-24 DIAGNOSIS — I2510 Atherosclerotic heart disease of native coronary artery without angina pectoris: Secondary | ICD-10-CM | POA: Diagnosis not present

## 2021-03-24 DIAGNOSIS — F419 Anxiety disorder, unspecified: Secondary | ICD-10-CM | POA: Diagnosis not present

## 2021-03-24 DIAGNOSIS — G40909 Epilepsy, unspecified, not intractable, without status epilepticus: Secondary | ICD-10-CM | POA: Diagnosis not present

## 2021-03-24 DIAGNOSIS — N186 End stage renal disease: Secondary | ICD-10-CM | POA: Diagnosis not present

## 2021-03-25 DIAGNOSIS — D631 Anemia in chronic kidney disease: Secondary | ICD-10-CM | POA: Diagnosis not present

## 2021-03-25 DIAGNOSIS — D509 Iron deficiency anemia, unspecified: Secondary | ICD-10-CM | POA: Diagnosis not present

## 2021-03-25 DIAGNOSIS — N186 End stage renal disease: Secondary | ICD-10-CM | POA: Diagnosis not present

## 2021-03-25 DIAGNOSIS — Z992 Dependence on renal dialysis: Secondary | ICD-10-CM | POA: Diagnosis not present

## 2021-03-27 DIAGNOSIS — Z992 Dependence on renal dialysis: Secondary | ICD-10-CM | POA: Diagnosis not present

## 2021-03-27 DIAGNOSIS — N186 End stage renal disease: Secondary | ICD-10-CM | POA: Diagnosis not present

## 2021-03-27 DIAGNOSIS — D631 Anemia in chronic kidney disease: Secondary | ICD-10-CM | POA: Diagnosis not present

## 2021-03-27 DIAGNOSIS — D509 Iron deficiency anemia, unspecified: Secondary | ICD-10-CM | POA: Diagnosis not present

## 2021-03-28 DIAGNOSIS — N390 Urinary tract infection, site not specified: Secondary | ICD-10-CM | POA: Diagnosis not present

## 2021-03-30 DIAGNOSIS — D509 Iron deficiency anemia, unspecified: Secondary | ICD-10-CM | POA: Diagnosis not present

## 2021-03-30 DIAGNOSIS — Z992 Dependence on renal dialysis: Secondary | ICD-10-CM | POA: Diagnosis not present

## 2021-03-30 DIAGNOSIS — D631 Anemia in chronic kidney disease: Secondary | ICD-10-CM | POA: Diagnosis not present

## 2021-03-30 DIAGNOSIS — N186 End stage renal disease: Secondary | ICD-10-CM | POA: Diagnosis not present

## 2021-03-31 DIAGNOSIS — W19XXXD Unspecified fall, subsequent encounter: Secondary | ICD-10-CM | POA: Diagnosis not present

## 2021-03-31 DIAGNOSIS — S065X9D Traumatic subdural hemorrhage with loss of consciousness of unspecified duration, subsequent encounter: Secondary | ICD-10-CM | POA: Diagnosis not present

## 2021-03-31 DIAGNOSIS — I2510 Atherosclerotic heart disease of native coronary artery without angina pectoris: Secondary | ICD-10-CM | POA: Diagnosis not present

## 2021-03-31 DIAGNOSIS — R300 Dysuria: Secondary | ICD-10-CM | POA: Diagnosis not present

## 2021-03-31 DIAGNOSIS — G40909 Epilepsy, unspecified, not intractable, without status epilepticus: Secondary | ICD-10-CM | POA: Diagnosis not present

## 2021-03-31 DIAGNOSIS — N186 End stage renal disease: Secondary | ICD-10-CM | POA: Diagnosis not present

## 2021-03-31 DIAGNOSIS — F419 Anxiety disorder, unspecified: Secondary | ICD-10-CM | POA: Diagnosis not present

## 2021-04-01 DIAGNOSIS — Z992 Dependence on renal dialysis: Secondary | ICD-10-CM | POA: Diagnosis not present

## 2021-04-01 DIAGNOSIS — D509 Iron deficiency anemia, unspecified: Secondary | ICD-10-CM | POA: Diagnosis not present

## 2021-04-01 DIAGNOSIS — N186 End stage renal disease: Secondary | ICD-10-CM | POA: Diagnosis not present

## 2021-04-01 DIAGNOSIS — D631 Anemia in chronic kidney disease: Secondary | ICD-10-CM | POA: Diagnosis not present

## 2021-04-02 DIAGNOSIS — I2510 Atherosclerotic heart disease of native coronary artery without angina pectoris: Secondary | ICD-10-CM | POA: Diagnosis not present

## 2021-04-02 DIAGNOSIS — R278 Other lack of coordination: Secondary | ICD-10-CM | POA: Diagnosis not present

## 2021-04-02 DIAGNOSIS — S065X9D Traumatic subdural hemorrhage with loss of consciousness of unspecified duration, subsequent encounter: Secondary | ICD-10-CM | POA: Diagnosis not present

## 2021-04-02 DIAGNOSIS — S065X0D Traumatic subdural hemorrhage without loss of consciousness, subsequent encounter: Secondary | ICD-10-CM | POA: Diagnosis not present

## 2021-04-02 DIAGNOSIS — M6281 Muscle weakness (generalized): Secondary | ICD-10-CM | POA: Diagnosis not present

## 2021-04-02 DIAGNOSIS — N186 End stage renal disease: Secondary | ICD-10-CM | POA: Diagnosis not present

## 2021-04-02 DIAGNOSIS — R2689 Other abnormalities of gait and mobility: Secondary | ICD-10-CM | POA: Diagnosis not present

## 2021-04-02 DIAGNOSIS — F419 Anxiety disorder, unspecified: Secondary | ICD-10-CM | POA: Diagnosis not present

## 2021-04-02 DIAGNOSIS — G40909 Epilepsy, unspecified, not intractable, without status epilepticus: Secondary | ICD-10-CM | POA: Diagnosis not present

## 2021-04-02 DIAGNOSIS — Z741 Need for assistance with personal care: Secondary | ICD-10-CM | POA: Diagnosis not present

## 2021-04-02 DIAGNOSIS — R4189 Other symptoms and signs involving cognitive functions and awareness: Secondary | ICD-10-CM | POA: Diagnosis not present

## 2021-04-02 DIAGNOSIS — W19XXXD Unspecified fall, subsequent encounter: Secondary | ICD-10-CM | POA: Diagnosis not present

## 2021-04-02 DIAGNOSIS — A0472 Enterocolitis due to Clostridium difficile, not specified as recurrent: Secondary | ICD-10-CM | POA: Diagnosis not present

## 2021-04-03 DIAGNOSIS — Z992 Dependence on renal dialysis: Secondary | ICD-10-CM | POA: Diagnosis not present

## 2021-04-03 DIAGNOSIS — D631 Anemia in chronic kidney disease: Secondary | ICD-10-CM | POA: Diagnosis not present

## 2021-04-03 DIAGNOSIS — N186 End stage renal disease: Secondary | ICD-10-CM | POA: Diagnosis not present

## 2021-04-03 DIAGNOSIS — D509 Iron deficiency anemia, unspecified: Secondary | ICD-10-CM | POA: Diagnosis not present

## 2021-04-04 DIAGNOSIS — S065X9D Traumatic subdural hemorrhage with loss of consciousness of unspecified duration, subsequent encounter: Secondary | ICD-10-CM | POA: Diagnosis not present

## 2021-04-04 DIAGNOSIS — F419 Anxiety disorder, unspecified: Secondary | ICD-10-CM | POA: Diagnosis not present

## 2021-04-04 DIAGNOSIS — W19XXXD Unspecified fall, subsequent encounter: Secondary | ICD-10-CM | POA: Diagnosis not present

## 2021-04-04 DIAGNOSIS — G40909 Epilepsy, unspecified, not intractable, without status epilepticus: Secondary | ICD-10-CM | POA: Diagnosis not present

## 2021-04-04 DIAGNOSIS — N186 End stage renal disease: Secondary | ICD-10-CM | POA: Diagnosis not present

## 2021-04-04 DIAGNOSIS — I2510 Atherosclerotic heart disease of native coronary artery without angina pectoris: Secondary | ICD-10-CM | POA: Diagnosis not present

## 2021-04-06 DIAGNOSIS — D509 Iron deficiency anemia, unspecified: Secondary | ICD-10-CM | POA: Diagnosis not present

## 2021-04-06 DIAGNOSIS — Z992 Dependence on renal dialysis: Secondary | ICD-10-CM | POA: Diagnosis not present

## 2021-04-06 DIAGNOSIS — N186 End stage renal disease: Secondary | ICD-10-CM | POA: Diagnosis not present

## 2021-04-06 DIAGNOSIS — D631 Anemia in chronic kidney disease: Secondary | ICD-10-CM | POA: Diagnosis not present

## 2021-04-07 DIAGNOSIS — F419 Anxiety disorder, unspecified: Secondary | ICD-10-CM | POA: Diagnosis not present

## 2021-04-07 DIAGNOSIS — I2510 Atherosclerotic heart disease of native coronary artery without angina pectoris: Secondary | ICD-10-CM | POA: Diagnosis not present

## 2021-04-07 DIAGNOSIS — N186 End stage renal disease: Secondary | ICD-10-CM | POA: Diagnosis not present

## 2021-04-07 DIAGNOSIS — S065X9D Traumatic subdural hemorrhage with loss of consciousness of unspecified duration, subsequent encounter: Secondary | ICD-10-CM | POA: Diagnosis not present

## 2021-04-07 DIAGNOSIS — G40909 Epilepsy, unspecified, not intractable, without status epilepticus: Secondary | ICD-10-CM | POA: Diagnosis not present

## 2021-04-07 DIAGNOSIS — W19XXXD Unspecified fall, subsequent encounter: Secondary | ICD-10-CM | POA: Diagnosis not present

## 2021-04-08 DIAGNOSIS — Z992 Dependence on renal dialysis: Secondary | ICD-10-CM | POA: Diagnosis not present

## 2021-04-08 DIAGNOSIS — N186 End stage renal disease: Secondary | ICD-10-CM | POA: Diagnosis not present

## 2021-04-08 DIAGNOSIS — D509 Iron deficiency anemia, unspecified: Secondary | ICD-10-CM | POA: Diagnosis not present

## 2021-04-08 DIAGNOSIS — D631 Anemia in chronic kidney disease: Secondary | ICD-10-CM | POA: Diagnosis not present

## 2021-04-09 DIAGNOSIS — W19XXXD Unspecified fall, subsequent encounter: Secondary | ICD-10-CM | POA: Diagnosis not present

## 2021-04-09 DIAGNOSIS — G40909 Epilepsy, unspecified, not intractable, without status epilepticus: Secondary | ICD-10-CM | POA: Diagnosis not present

## 2021-04-09 DIAGNOSIS — I2510 Atherosclerotic heart disease of native coronary artery without angina pectoris: Secondary | ICD-10-CM | POA: Diagnosis not present

## 2021-04-09 DIAGNOSIS — N186 End stage renal disease: Secondary | ICD-10-CM | POA: Diagnosis not present

## 2021-04-09 DIAGNOSIS — F419 Anxiety disorder, unspecified: Secondary | ICD-10-CM | POA: Diagnosis not present

## 2021-04-09 DIAGNOSIS — S065X9D Traumatic subdural hemorrhage with loss of consciousness of unspecified duration, subsequent encounter: Secondary | ICD-10-CM | POA: Diagnosis not present

## 2021-04-10 DIAGNOSIS — N186 End stage renal disease: Secondary | ICD-10-CM | POA: Diagnosis not present

## 2021-04-10 DIAGNOSIS — D631 Anemia in chronic kidney disease: Secondary | ICD-10-CM | POA: Diagnosis not present

## 2021-04-10 DIAGNOSIS — W19XXXD Unspecified fall, subsequent encounter: Secondary | ICD-10-CM | POA: Diagnosis not present

## 2021-04-10 DIAGNOSIS — Z992 Dependence on renal dialysis: Secondary | ICD-10-CM | POA: Diagnosis not present

## 2021-04-10 DIAGNOSIS — F419 Anxiety disorder, unspecified: Secondary | ICD-10-CM | POA: Diagnosis not present

## 2021-04-10 DIAGNOSIS — G40909 Epilepsy, unspecified, not intractable, without status epilepticus: Secondary | ICD-10-CM | POA: Diagnosis not present

## 2021-04-10 DIAGNOSIS — S065X9D Traumatic subdural hemorrhage with loss of consciousness of unspecified duration, subsequent encounter: Secondary | ICD-10-CM | POA: Diagnosis not present

## 2021-04-10 DIAGNOSIS — I2510 Atherosclerotic heart disease of native coronary artery without angina pectoris: Secondary | ICD-10-CM | POA: Diagnosis not present

## 2021-04-10 DIAGNOSIS — D509 Iron deficiency anemia, unspecified: Secondary | ICD-10-CM | POA: Diagnosis not present

## 2021-04-11 DIAGNOSIS — F419 Anxiety disorder, unspecified: Secondary | ICD-10-CM | POA: Diagnosis not present

## 2021-04-11 DIAGNOSIS — S065X9D Traumatic subdural hemorrhage with loss of consciousness of unspecified duration, subsequent encounter: Secondary | ICD-10-CM | POA: Diagnosis not present

## 2021-04-11 DIAGNOSIS — W19XXXD Unspecified fall, subsequent encounter: Secondary | ICD-10-CM | POA: Diagnosis not present

## 2021-04-11 DIAGNOSIS — I2510 Atherosclerotic heart disease of native coronary artery without angina pectoris: Secondary | ICD-10-CM | POA: Diagnosis not present

## 2021-04-11 DIAGNOSIS — G40909 Epilepsy, unspecified, not intractable, without status epilepticus: Secondary | ICD-10-CM | POA: Diagnosis not present

## 2021-04-11 DIAGNOSIS — N186 End stage renal disease: Secondary | ICD-10-CM | POA: Diagnosis not present

## 2021-04-12 DIAGNOSIS — W19XXXD Unspecified fall, subsequent encounter: Secondary | ICD-10-CM | POA: Diagnosis not present

## 2021-04-12 DIAGNOSIS — I2510 Atherosclerotic heart disease of native coronary artery without angina pectoris: Secondary | ICD-10-CM | POA: Diagnosis not present

## 2021-04-12 DIAGNOSIS — S065X9D Traumatic subdural hemorrhage with loss of consciousness of unspecified duration, subsequent encounter: Secondary | ICD-10-CM | POA: Diagnosis not present

## 2021-04-12 DIAGNOSIS — G40909 Epilepsy, unspecified, not intractable, without status epilepticus: Secondary | ICD-10-CM | POA: Diagnosis not present

## 2021-04-12 DIAGNOSIS — N186 End stage renal disease: Secondary | ICD-10-CM | POA: Diagnosis not present

## 2021-04-12 DIAGNOSIS — F419 Anxiety disorder, unspecified: Secondary | ICD-10-CM | POA: Diagnosis not present

## 2021-04-13 DIAGNOSIS — D509 Iron deficiency anemia, unspecified: Secondary | ICD-10-CM | POA: Diagnosis not present

## 2021-04-13 DIAGNOSIS — Z992 Dependence on renal dialysis: Secondary | ICD-10-CM | POA: Diagnosis not present

## 2021-04-13 DIAGNOSIS — N186 End stage renal disease: Secondary | ICD-10-CM | POA: Diagnosis not present

## 2021-04-13 DIAGNOSIS — D631 Anemia in chronic kidney disease: Secondary | ICD-10-CM | POA: Diagnosis not present

## 2021-04-14 DIAGNOSIS — W19XXXD Unspecified fall, subsequent encounter: Secondary | ICD-10-CM | POA: Diagnosis not present

## 2021-04-14 DIAGNOSIS — N186 End stage renal disease: Secondary | ICD-10-CM | POA: Diagnosis not present

## 2021-04-14 DIAGNOSIS — G40909 Epilepsy, unspecified, not intractable, without status epilepticus: Secondary | ICD-10-CM | POA: Diagnosis not present

## 2021-04-14 DIAGNOSIS — I2510 Atherosclerotic heart disease of native coronary artery without angina pectoris: Secondary | ICD-10-CM | POA: Diagnosis not present

## 2021-04-14 DIAGNOSIS — S065X9D Traumatic subdural hemorrhage with loss of consciousness of unspecified duration, subsequent encounter: Secondary | ICD-10-CM | POA: Diagnosis not present

## 2021-04-14 DIAGNOSIS — F419 Anxiety disorder, unspecified: Secondary | ICD-10-CM | POA: Diagnosis not present

## 2021-04-15 DIAGNOSIS — Z992 Dependence on renal dialysis: Secondary | ICD-10-CM | POA: Diagnosis not present

## 2021-04-15 DIAGNOSIS — D509 Iron deficiency anemia, unspecified: Secondary | ICD-10-CM | POA: Diagnosis not present

## 2021-04-15 DIAGNOSIS — N186 End stage renal disease: Secondary | ICD-10-CM | POA: Diagnosis not present

## 2021-04-15 DIAGNOSIS — D631 Anemia in chronic kidney disease: Secondary | ICD-10-CM | POA: Diagnosis not present

## 2021-04-16 DIAGNOSIS — I2510 Atherosclerotic heart disease of native coronary artery without angina pectoris: Secondary | ICD-10-CM | POA: Diagnosis not present

## 2021-04-16 DIAGNOSIS — W19XXXD Unspecified fall, subsequent encounter: Secondary | ICD-10-CM | POA: Diagnosis not present

## 2021-04-16 DIAGNOSIS — G40909 Epilepsy, unspecified, not intractable, without status epilepticus: Secondary | ICD-10-CM | POA: Diagnosis not present

## 2021-04-16 DIAGNOSIS — S065X9D Traumatic subdural hemorrhage with loss of consciousness of unspecified duration, subsequent encounter: Secondary | ICD-10-CM | POA: Diagnosis not present

## 2021-04-16 DIAGNOSIS — N186 End stage renal disease: Secondary | ICD-10-CM | POA: Diagnosis not present

## 2021-04-16 DIAGNOSIS — F419 Anxiety disorder, unspecified: Secondary | ICD-10-CM | POA: Diagnosis not present

## 2021-04-17 DIAGNOSIS — D509 Iron deficiency anemia, unspecified: Secondary | ICD-10-CM | POA: Diagnosis not present

## 2021-04-17 DIAGNOSIS — Z992 Dependence on renal dialysis: Secondary | ICD-10-CM | POA: Diagnosis not present

## 2021-04-17 DIAGNOSIS — D631 Anemia in chronic kidney disease: Secondary | ICD-10-CM | POA: Diagnosis not present

## 2021-04-17 DIAGNOSIS — N186 End stage renal disease: Secondary | ICD-10-CM | POA: Diagnosis not present

## 2021-04-18 DIAGNOSIS — I2510 Atherosclerotic heart disease of native coronary artery without angina pectoris: Secondary | ICD-10-CM | POA: Diagnosis not present

## 2021-04-18 DIAGNOSIS — W19XXXD Unspecified fall, subsequent encounter: Secondary | ICD-10-CM | POA: Diagnosis not present

## 2021-04-18 DIAGNOSIS — F419 Anxiety disorder, unspecified: Secondary | ICD-10-CM | POA: Diagnosis not present

## 2021-04-18 DIAGNOSIS — S065X9D Traumatic subdural hemorrhage with loss of consciousness of unspecified duration, subsequent encounter: Secondary | ICD-10-CM | POA: Diagnosis not present

## 2021-04-18 DIAGNOSIS — G40909 Epilepsy, unspecified, not intractable, without status epilepticus: Secondary | ICD-10-CM | POA: Diagnosis not present

## 2021-04-18 DIAGNOSIS — N186 End stage renal disease: Secondary | ICD-10-CM | POA: Diagnosis not present

## 2021-04-20 DIAGNOSIS — Z992 Dependence on renal dialysis: Secondary | ICD-10-CM | POA: Diagnosis not present

## 2021-04-20 DIAGNOSIS — N186 End stage renal disease: Secondary | ICD-10-CM | POA: Diagnosis not present

## 2021-04-20 DIAGNOSIS — D509 Iron deficiency anemia, unspecified: Secondary | ICD-10-CM | POA: Diagnosis not present

## 2021-04-20 DIAGNOSIS — D631 Anemia in chronic kidney disease: Secondary | ICD-10-CM | POA: Diagnosis not present

## 2021-04-21 DIAGNOSIS — K219 Gastro-esophageal reflux disease without esophagitis: Secondary | ICD-10-CM

## 2021-04-21 DIAGNOSIS — F39 Unspecified mood [affective] disorder: Secondary | ICD-10-CM

## 2021-04-21 DIAGNOSIS — F445 Conversion disorder with seizures or convulsions: Secondary | ICD-10-CM | POA: Diagnosis not present

## 2021-04-21 DIAGNOSIS — R5381 Other malaise: Secondary | ICD-10-CM

## 2021-04-21 DIAGNOSIS — I2510 Atherosclerotic heart disease of native coronary artery without angina pectoris: Secondary | ICD-10-CM | POA: Diagnosis not present

## 2021-04-21 DIAGNOSIS — W19XXXD Unspecified fall, subsequent encounter: Secondary | ICD-10-CM | POA: Diagnosis not present

## 2021-04-21 DIAGNOSIS — N186 End stage renal disease: Secondary | ICD-10-CM | POA: Diagnosis not present

## 2021-04-21 DIAGNOSIS — F419 Anxiety disorder, unspecified: Secondary | ICD-10-CM | POA: Diagnosis not present

## 2021-04-21 DIAGNOSIS — S065X9D Traumatic subdural hemorrhage with loss of consciousness of unspecified duration, subsequent encounter: Secondary | ICD-10-CM | POA: Diagnosis not present

## 2021-04-21 DIAGNOSIS — G40909 Epilepsy, unspecified, not intractable, without status epilepticus: Secondary | ICD-10-CM | POA: Diagnosis not present

## 2021-04-21 DIAGNOSIS — I10 Essential (primary) hypertension: Secondary | ICD-10-CM

## 2021-04-22 DIAGNOSIS — D509 Iron deficiency anemia, unspecified: Secondary | ICD-10-CM | POA: Diagnosis not present

## 2021-04-22 DIAGNOSIS — N186 End stage renal disease: Secondary | ICD-10-CM | POA: Diagnosis not present

## 2021-04-22 DIAGNOSIS — D631 Anemia in chronic kidney disease: Secondary | ICD-10-CM | POA: Diagnosis not present

## 2021-04-22 DIAGNOSIS — Z992 Dependence on renal dialysis: Secondary | ICD-10-CM | POA: Diagnosis not present

## 2021-04-23 DIAGNOSIS — W19XXXD Unspecified fall, subsequent encounter: Secondary | ICD-10-CM | POA: Diagnosis not present

## 2021-04-23 DIAGNOSIS — N186 End stage renal disease: Secondary | ICD-10-CM | POA: Diagnosis not present

## 2021-04-23 DIAGNOSIS — S065X9D Traumatic subdural hemorrhage with loss of consciousness of unspecified duration, subsequent encounter: Secondary | ICD-10-CM | POA: Diagnosis not present

## 2021-04-23 DIAGNOSIS — F419 Anxiety disorder, unspecified: Secondary | ICD-10-CM | POA: Diagnosis not present

## 2021-04-23 DIAGNOSIS — G40909 Epilepsy, unspecified, not intractable, without status epilepticus: Secondary | ICD-10-CM | POA: Diagnosis not present

## 2021-04-23 DIAGNOSIS — I2510 Atherosclerotic heart disease of native coronary artery without angina pectoris: Secondary | ICD-10-CM | POA: Diagnosis not present

## 2021-04-24 DIAGNOSIS — N186 End stage renal disease: Secondary | ICD-10-CM | POA: Diagnosis not present

## 2021-04-24 DIAGNOSIS — D631 Anemia in chronic kidney disease: Secondary | ICD-10-CM | POA: Diagnosis not present

## 2021-04-24 DIAGNOSIS — Z992 Dependence on renal dialysis: Secondary | ICD-10-CM | POA: Diagnosis not present

## 2021-04-24 DIAGNOSIS — D509 Iron deficiency anemia, unspecified: Secondary | ICD-10-CM | POA: Diagnosis not present

## 2021-04-26 DIAGNOSIS — R0989 Other specified symptoms and signs involving the circulatory and respiratory systems: Secondary | ICD-10-CM | POA: Diagnosis not present

## 2021-04-27 DIAGNOSIS — N186 End stage renal disease: Secondary | ICD-10-CM | POA: Diagnosis not present

## 2021-04-27 DIAGNOSIS — D631 Anemia in chronic kidney disease: Secondary | ICD-10-CM | POA: Diagnosis not present

## 2021-04-27 DIAGNOSIS — Z992 Dependence on renal dialysis: Secondary | ICD-10-CM | POA: Diagnosis not present

## 2021-04-27 DIAGNOSIS — D509 Iron deficiency anemia, unspecified: Secondary | ICD-10-CM | POA: Diagnosis not present

## 2021-04-28 DIAGNOSIS — H3582 Retinal ischemia: Secondary | ICD-10-CM | POA: Diagnosis not present

## 2021-04-29 DIAGNOSIS — N186 End stage renal disease: Secondary | ICD-10-CM | POA: Diagnosis not present

## 2021-04-29 DIAGNOSIS — Z992 Dependence on renal dialysis: Secondary | ICD-10-CM | POA: Diagnosis not present

## 2021-04-29 DIAGNOSIS — D631 Anemia in chronic kidney disease: Secondary | ICD-10-CM | POA: Diagnosis not present

## 2021-04-29 DIAGNOSIS — D509 Iron deficiency anemia, unspecified: Secondary | ICD-10-CM | POA: Diagnosis not present

## 2021-05-01 DIAGNOSIS — N186 End stage renal disease: Secondary | ICD-10-CM | POA: Diagnosis not present

## 2021-05-01 DIAGNOSIS — D509 Iron deficiency anemia, unspecified: Secondary | ICD-10-CM | POA: Diagnosis not present

## 2021-05-01 DIAGNOSIS — Z992 Dependence on renal dialysis: Secondary | ICD-10-CM | POA: Diagnosis not present

## 2021-05-01 DIAGNOSIS — D631 Anemia in chronic kidney disease: Secondary | ICD-10-CM | POA: Diagnosis not present

## 2021-05-02 DIAGNOSIS — N186 End stage renal disease: Secondary | ICD-10-CM | POA: Diagnosis not present

## 2021-05-02 DIAGNOSIS — Z992 Dependence on renal dialysis: Secondary | ICD-10-CM | POA: Diagnosis not present

## 2021-05-04 DIAGNOSIS — Z992 Dependence on renal dialysis: Secondary | ICD-10-CM | POA: Diagnosis not present

## 2021-05-04 DIAGNOSIS — N186 End stage renal disease: Secondary | ICD-10-CM | POA: Diagnosis not present

## 2021-05-04 DIAGNOSIS — L603 Nail dystrophy: Secondary | ICD-10-CM | POA: Diagnosis not present

## 2021-05-04 DIAGNOSIS — I7091 Generalized atherosclerosis: Secondary | ICD-10-CM | POA: Diagnosis not present

## 2021-05-04 DIAGNOSIS — D509 Iron deficiency anemia, unspecified: Secondary | ICD-10-CM | POA: Diagnosis not present

## 2021-05-05 DIAGNOSIS — I2510 Atherosclerotic heart disease of native coronary artery without angina pectoris: Secondary | ICD-10-CM | POA: Diagnosis not present

## 2021-05-05 DIAGNOSIS — F419 Anxiety disorder, unspecified: Secondary | ICD-10-CM | POA: Diagnosis not present

## 2021-05-05 DIAGNOSIS — R4189 Other symptoms and signs involving cognitive functions and awareness: Secondary | ICD-10-CM | POA: Diagnosis not present

## 2021-05-05 DIAGNOSIS — R278 Other lack of coordination: Secondary | ICD-10-CM | POA: Diagnosis not present

## 2021-05-05 DIAGNOSIS — M6281 Muscle weakness (generalized): Secondary | ICD-10-CM | POA: Diagnosis not present

## 2021-05-05 DIAGNOSIS — G40909 Epilepsy, unspecified, not intractable, without status epilepticus: Secondary | ICD-10-CM | POA: Diagnosis not present

## 2021-05-05 DIAGNOSIS — W19XXXD Unspecified fall, subsequent encounter: Secondary | ICD-10-CM | POA: Diagnosis not present

## 2021-05-05 DIAGNOSIS — S065X0D Traumatic subdural hemorrhage without loss of consciousness, subsequent encounter: Secondary | ICD-10-CM | POA: Diagnosis not present

## 2021-05-05 DIAGNOSIS — Z741 Need for assistance with personal care: Secondary | ICD-10-CM | POA: Diagnosis not present

## 2021-05-05 DIAGNOSIS — S065X9D Traumatic subdural hemorrhage with loss of consciousness of unspecified duration, subsequent encounter: Secondary | ICD-10-CM | POA: Diagnosis not present

## 2021-05-05 DIAGNOSIS — R2689 Other abnormalities of gait and mobility: Secondary | ICD-10-CM | POA: Diagnosis not present

## 2021-05-05 DIAGNOSIS — A0472 Enterocolitis due to Clostridium difficile, not specified as recurrent: Secondary | ICD-10-CM | POA: Diagnosis not present

## 2021-05-05 DIAGNOSIS — N186 End stage renal disease: Secondary | ICD-10-CM | POA: Diagnosis not present

## 2021-05-06 DIAGNOSIS — B029 Zoster without complications: Secondary | ICD-10-CM | POA: Diagnosis not present

## 2021-05-06 DIAGNOSIS — Z992 Dependence on renal dialysis: Secondary | ICD-10-CM | POA: Diagnosis not present

## 2021-05-06 DIAGNOSIS — D509 Iron deficiency anemia, unspecified: Secondary | ICD-10-CM | POA: Diagnosis not present

## 2021-05-06 DIAGNOSIS — N186 End stage renal disease: Secondary | ICD-10-CM | POA: Diagnosis not present

## 2021-05-06 DIAGNOSIS — H1132 Conjunctival hemorrhage, left eye: Secondary | ICD-10-CM | POA: Diagnosis not present

## 2021-05-06 DIAGNOSIS — B3731 Acute candidiasis of vulva and vagina: Secondary | ICD-10-CM | POA: Diagnosis not present

## 2021-05-08 ENCOUNTER — Emergency Department
Admission: EM | Admit: 2021-05-08 | Discharge: 2021-05-08 | Disposition: A | Discharge status: Other Acute Inpatient Hospital | Payer: Medicare Other | Attending: Emergency Medicine | Admitting: Emergency Medicine

## 2021-05-08 ENCOUNTER — Emergency Department: Payer: Medicare Other

## 2021-05-08 ENCOUNTER — Inpatient Hospital Stay (HOSPITAL_COMMUNITY)
Admission: AD | Admit: 2021-05-08 | Discharge: 2021-05-18 | DRG: 100 | Disposition: A | Discharge status: Hospice | Payer: Medicare Other | Source: Other Acute Inpatient Hospital | Attending: Internal Medicine | Admitting: Internal Medicine

## 2021-05-08 ENCOUNTER — Inpatient Hospital Stay (HOSPITAL_COMMUNITY): Payer: Medicare Other

## 2021-05-08 ENCOUNTER — Other Ambulatory Visit: Payer: Self-pay

## 2021-05-08 ENCOUNTER — Encounter: Payer: Self-pay | Admitting: Radiology

## 2021-05-08 DIAGNOSIS — Z800 Family history of malignant neoplasm of digestive organs: Secondary | ICD-10-CM

## 2021-05-08 DIAGNOSIS — I120 Hypertensive chronic kidney disease with stage 5 chronic kidney disease or end stage renal disease: Secondary | ICD-10-CM | POA: Diagnosis not present

## 2021-05-08 DIAGNOSIS — R5381 Other malaise: Secondary | ICD-10-CM | POA: Diagnosis not present

## 2021-05-08 DIAGNOSIS — E042 Nontoxic multinodular goiter: Secondary | ICD-10-CM | POA: Diagnosis present

## 2021-05-08 DIAGNOSIS — Z66 Do not resuscitate: Secondary | ICD-10-CM | POA: Diagnosis present

## 2021-05-08 DIAGNOSIS — N186 End stage renal disease: Secondary | ICD-10-CM | POA: Diagnosis present

## 2021-05-08 DIAGNOSIS — Z8619 Personal history of other infectious and parasitic diseases: Secondary | ICD-10-CM

## 2021-05-08 DIAGNOSIS — E8770 Fluid overload, unspecified: Secondary | ICD-10-CM | POA: Diagnosis present

## 2021-05-08 DIAGNOSIS — R911 Solitary pulmonary nodule: Secondary | ICD-10-CM | POA: Diagnosis not present

## 2021-05-08 DIAGNOSIS — I160 Hypertensive urgency: Secondary | ICD-10-CM | POA: Diagnosis present

## 2021-05-08 DIAGNOSIS — E871 Hypo-osmolality and hyponatremia: Secondary | ICD-10-CM | POA: Diagnosis present

## 2021-05-08 DIAGNOSIS — Z90710 Acquired absence of both cervix and uterus: Secondary | ICD-10-CM

## 2021-05-08 DIAGNOSIS — E785 Hyperlipidemia, unspecified: Secondary | ICD-10-CM | POA: Diagnosis present

## 2021-05-08 DIAGNOSIS — G40901 Epilepsy, unspecified, not intractable, with status epilepticus: Secondary | ICD-10-CM

## 2021-05-08 DIAGNOSIS — I2510 Atherosclerotic heart disease of native coronary artery without angina pectoris: Secondary | ICD-10-CM | POA: Diagnosis not present

## 2021-05-08 DIAGNOSIS — E43 Unspecified severe protein-calorie malnutrition: Secondary | ICD-10-CM | POA: Diagnosis present

## 2021-05-08 DIAGNOSIS — R569 Unspecified convulsions: Secondary | ICD-10-CM

## 2021-05-08 DIAGNOSIS — Z452 Encounter for adjustment and management of vascular access device: Secondary | ICD-10-CM | POA: Diagnosis not present

## 2021-05-08 DIAGNOSIS — I6502 Occlusion and stenosis of left vertebral artery: Secondary | ICD-10-CM | POA: Diagnosis not present

## 2021-05-08 DIAGNOSIS — I6523 Occlusion and stenosis of bilateral carotid arteries: Secondary | ICD-10-CM | POA: Diagnosis not present

## 2021-05-08 DIAGNOSIS — Z8679 Personal history of other diseases of the circulatory system: Secondary | ICD-10-CM | POA: Diagnosis not present

## 2021-05-08 DIAGNOSIS — Z9049 Acquired absence of other specified parts of digestive tract: Secondary | ICD-10-CM

## 2021-05-08 DIAGNOSIS — Z681 Body mass index (BMI) 19 or less, adult: Secondary | ICD-10-CM

## 2021-05-08 DIAGNOSIS — Z1621 Resistance to vancomycin: Secondary | ICD-10-CM | POA: Diagnosis present

## 2021-05-08 DIAGNOSIS — Z992 Dependence on renal dialysis: Secondary | ICD-10-CM

## 2021-05-08 DIAGNOSIS — R464 Slowness and poor responsiveness: Secondary | ICD-10-CM | POA: Diagnosis not present

## 2021-05-08 DIAGNOSIS — R0602 Shortness of breath: Secondary | ICD-10-CM | POA: Diagnosis not present

## 2021-05-08 DIAGNOSIS — J90 Pleural effusion, not elsewhere classified: Secondary | ICD-10-CM | POA: Diagnosis present

## 2021-05-08 DIAGNOSIS — Z20822 Contact with and (suspected) exposure to covid-19: Secondary | ICD-10-CM | POA: Insufficient documentation

## 2021-05-08 DIAGNOSIS — G9389 Other specified disorders of brain: Secondary | ICD-10-CM | POA: Diagnosis not present

## 2021-05-08 DIAGNOSIS — Z79899 Other long term (current) drug therapy: Secondary | ICD-10-CM | POA: Insufficient documentation

## 2021-05-08 DIAGNOSIS — I6203 Nontraumatic chronic subdural hemorrhage: Secondary | ICD-10-CM | POA: Diagnosis present

## 2021-05-08 DIAGNOSIS — J9811 Atelectasis: Secondary | ICD-10-CM | POA: Diagnosis not present

## 2021-05-08 DIAGNOSIS — Z881 Allergy status to other antibiotic agents status: Secondary | ICD-10-CM

## 2021-05-08 DIAGNOSIS — M7989 Other specified soft tissue disorders: Secondary | ICD-10-CM | POA: Diagnosis present

## 2021-05-08 DIAGNOSIS — D631 Anemia in chronic kidney disease: Secondary | ICD-10-CM | POA: Diagnosis present

## 2021-05-08 DIAGNOSIS — G9341 Metabolic encephalopathy: Secondary | ICD-10-CM | POA: Diagnosis present

## 2021-05-08 DIAGNOSIS — Z7901 Long term (current) use of anticoagulants: Secondary | ICD-10-CM

## 2021-05-08 DIAGNOSIS — G319 Degenerative disease of nervous system, unspecified: Secondary | ICD-10-CM | POA: Diagnosis not present

## 2021-05-08 DIAGNOSIS — Z9115 Patient's noncompliance with renal dialysis: Secondary | ICD-10-CM

## 2021-05-08 DIAGNOSIS — R7309 Other abnormal glucose: Secondary | ICD-10-CM | POA: Diagnosis not present

## 2021-05-08 DIAGNOSIS — R414 Neurologic neglect syndrome: Secondary | ICD-10-CM | POA: Diagnosis present

## 2021-05-08 DIAGNOSIS — Z8616 Personal history of COVID-19: Secondary | ICD-10-CM

## 2021-05-08 DIAGNOSIS — I159 Secondary hypertension, unspecified: Secondary | ICD-10-CM | POA: Diagnosis present

## 2021-05-08 DIAGNOSIS — R791 Abnormal coagulation profile: Secondary | ICD-10-CM | POA: Diagnosis present

## 2021-05-08 DIAGNOSIS — Z8719 Personal history of other diseases of the digestive system: Secondary | ICD-10-CM

## 2021-05-08 DIAGNOSIS — Z743 Need for continuous supervision: Secondary | ICD-10-CM | POA: Diagnosis not present

## 2021-05-08 DIAGNOSIS — Z880 Allergy status to penicillin: Secondary | ICD-10-CM

## 2021-05-08 DIAGNOSIS — G8191 Hemiplegia, unspecified affecting right dominant side: Secondary | ICD-10-CM | POA: Diagnosis present

## 2021-05-08 DIAGNOSIS — Z515 Encounter for palliative care: Secondary | ICD-10-CM | POA: Diagnosis not present

## 2021-05-08 DIAGNOSIS — D181 Lymphangioma, any site: Secondary | ICD-10-CM | POA: Diagnosis not present

## 2021-05-08 DIAGNOSIS — R4182 Altered mental status, unspecified: Secondary | ICD-10-CM | POA: Diagnosis not present

## 2021-05-08 DIAGNOSIS — R9431 Abnormal electrocardiogram [ECG] [EKG]: Secondary | ICD-10-CM | POA: Diagnosis not present

## 2021-05-08 DIAGNOSIS — Z882 Allergy status to sulfonamides status: Secondary | ICD-10-CM

## 2021-05-08 DIAGNOSIS — Z4659 Encounter for fitting and adjustment of other gastrointestinal appliance and device: Secondary | ICD-10-CM

## 2021-05-08 DIAGNOSIS — R64 Cachexia: Secondary | ICD-10-CM | POA: Diagnosis present

## 2021-05-08 DIAGNOSIS — Z7401 Bed confinement status: Secondary | ICD-10-CM | POA: Diagnosis not present

## 2021-05-08 DIAGNOSIS — R4701 Aphasia: Secondary | ICD-10-CM | POA: Diagnosis present

## 2021-05-08 DIAGNOSIS — I616 Nontraumatic intracerebral hemorrhage, multiple localized: Secondary | ICD-10-CM | POA: Diagnosis not present

## 2021-05-08 DIAGNOSIS — F419 Anxiety disorder, unspecified: Secondary | ICD-10-CM | POA: Diagnosis present

## 2021-05-08 DIAGNOSIS — L89152 Pressure ulcer of sacral region, stage 2: Secondary | ICD-10-CM | POA: Diagnosis present

## 2021-05-08 DIAGNOSIS — Z7189 Other specified counseling: Secondary | ICD-10-CM | POA: Diagnosis not present

## 2021-05-08 DIAGNOSIS — Z955 Presence of coronary angioplasty implant and graft: Secondary | ICD-10-CM | POA: Diagnosis not present

## 2021-05-08 DIAGNOSIS — N2581 Secondary hyperparathyroidism of renal origin: Secondary | ICD-10-CM | POA: Diagnosis present

## 2021-05-08 DIAGNOSIS — R627 Adult failure to thrive: Secondary | ICD-10-CM | POA: Diagnosis present

## 2021-05-08 DIAGNOSIS — N3000 Acute cystitis without hematuria: Secondary | ICD-10-CM | POA: Diagnosis present

## 2021-05-08 DIAGNOSIS — B029 Zoster without complications: Secondary | ICD-10-CM | POA: Diagnosis present

## 2021-05-08 DIAGNOSIS — Z4682 Encounter for fitting and adjustment of non-vascular catheter: Secondary | ICD-10-CM | POA: Diagnosis not present

## 2021-05-08 DIAGNOSIS — K219 Gastro-esophageal reflux disease without esophagitis: Secondary | ICD-10-CM | POA: Diagnosis present

## 2021-05-08 DIAGNOSIS — Z888 Allergy status to other drugs, medicaments and biological substances status: Secondary | ICD-10-CM

## 2021-05-08 DIAGNOSIS — R531 Weakness: Secondary | ICD-10-CM | POA: Diagnosis not present

## 2021-05-08 DIAGNOSIS — I10 Essential (primary) hypertension: Secondary | ICD-10-CM | POA: Diagnosis not present

## 2021-05-08 DIAGNOSIS — Z8249 Family history of ischemic heart disease and other diseases of the circulatory system: Secondary | ICD-10-CM

## 2021-05-08 DIAGNOSIS — B952 Enterococcus as the cause of diseases classified elsewhere: Secondary | ICD-10-CM | POA: Diagnosis present

## 2021-05-08 DIAGNOSIS — I255 Ischemic cardiomyopathy: Secondary | ICD-10-CM | POA: Diagnosis present

## 2021-05-08 DIAGNOSIS — R0689 Other abnormalities of breathing: Secondary | ICD-10-CM | POA: Diagnosis not present

## 2021-05-08 DIAGNOSIS — I700 Atherosclerosis of aorta: Secondary | ICD-10-CM | POA: Diagnosis not present

## 2021-05-08 DIAGNOSIS — R54 Age-related physical debility: Secondary | ICD-10-CM | POA: Diagnosis present

## 2021-05-08 LAB — DIFFERENTIAL
Abs Immature Granulocytes: 0.05 10*3/uL (ref 0.00–0.07)
Basophils Absolute: 0.1 10*3/uL (ref 0.0–0.1)
Basophils Relative: 1 %
Eosinophils Absolute: 0.3 10*3/uL (ref 0.0–0.5)
Eosinophils Relative: 3 %
Immature Granulocytes: 0 %
Lymphocytes Relative: 12 %
Lymphs Abs: 1.4 10*3/uL (ref 0.7–4.0)
Monocytes Absolute: 0.6 10*3/uL (ref 0.1–1.0)
Monocytes Relative: 5 %
Neutro Abs: 8.9 10*3/uL — ABNORMAL HIGH (ref 1.7–7.7)
Neutrophils Relative %: 79 %

## 2021-05-08 LAB — COMPREHENSIVE METABOLIC PANEL
ALT: 11 U/L (ref 0–44)
AST: 17 U/L (ref 15–41)
Albumin: 2.5 g/dL — ABNORMAL LOW (ref 3.5–5.0)
Alkaline Phosphatase: 108 U/L (ref 38–126)
Anion gap: 6 (ref 5–15)
BUN: 32 mg/dL — ABNORMAL HIGH (ref 8–23)
CO2: 26 mmol/L (ref 22–32)
Calcium: 8.7 mg/dL — ABNORMAL LOW (ref 8.9–10.3)
Chloride: 97 mmol/L — ABNORMAL LOW (ref 98–111)
Creatinine, Ser: 3.39 mg/dL — ABNORMAL HIGH (ref 0.44–1.00)
GFR, Estimated: 14 mL/min — ABNORMAL LOW (ref >60)
Glucose, Bld: 102 mg/dL — ABNORMAL HIGH (ref 70–99)
Potassium: 3.5 mmol/L (ref 3.5–5.1)
Sodium: 129 mmol/L — ABNORMAL LOW (ref 135–145)
Total Bilirubin: 0.7 mg/dL (ref 0.3–1.2)
Total Protein: 5.3 g/dL — ABNORMAL LOW (ref 6.5–8.1)

## 2021-05-08 LAB — GLUCOSE, CAPILLARY
Glucose-Capillary: 120 mg/dL — ABNORMAL HIGH (ref 70–99)
Glucose-Capillary: 120 mg/dL — ABNORMAL HIGH (ref 70–99)

## 2021-05-08 LAB — MAGNESIUM: Magnesium: 1.7 mg/dL (ref 1.7–2.4)

## 2021-05-08 LAB — PROTIME-INR
INR: 4 — ABNORMAL HIGH (ref 0.8–1.2)
Prothrombin Time: 39.1 seconds — ABNORMAL HIGH (ref 11.4–15.2)

## 2021-05-08 LAB — RESP PANEL BY RT-PCR (FLU A&B, COVID) ARPGX2
Influenza A by PCR: NEGATIVE
Influenza B by PCR: NEGATIVE
SARS Coronavirus 2 by RT PCR: NEGATIVE

## 2021-05-08 LAB — CBC
HCT: 33 % — ABNORMAL LOW (ref 36.0–46.0)
Hemoglobin: 10.9 g/dL — ABNORMAL LOW (ref 12.0–15.0)
MCH: 30.3 pg (ref 26.0–34.0)
MCHC: 33 g/dL (ref 30.0–36.0)
MCV: 91.7 fL (ref 80.0–100.0)
Platelets: 241 10*3/uL (ref 150–400)
RBC: 3.6 MIL/uL — ABNORMAL LOW (ref 3.87–5.11)
RDW: 16.4 % — ABNORMAL HIGH (ref 11.5–15.5)
WBC: 11.3 10*3/uL — ABNORMAL HIGH (ref 4.0–10.5)
nRBC: 0 % (ref 0.0–0.2)

## 2021-05-08 LAB — AMMONIA: Ammonia: 30 umol/L (ref 9–35)

## 2021-05-08 LAB — CBG MONITORING, ED: Glucose-Capillary: 97 mg/dL (ref 70–99)

## 2021-05-08 LAB — APTT: aPTT: 54 seconds — ABNORMAL HIGH (ref 24–36)

## 2021-05-08 LAB — ETHANOL: Alcohol, Ethyl (B): <10 mg/dL (ref <10)

## 2021-05-08 MED ORDER — HEPARIN SODIUM (PORCINE) 5000 UNIT/ML IJ SOLN: 5000.0000 [IU] | Freq: Three times a day (TID) | INTRAMUSCULAR | Status: DC

## 2021-05-08 MED ORDER — SODIUM CHLORIDE 0.9 % IV SOLN
200.0000 mg | Freq: Once | INTRAVENOUS | Status: AC
  Administered 2021-05-08: 200 mg via INTRAVENOUS
  Filled 2021-05-08: qty 20

## 2021-05-08 MED ORDER — PANTOPRAZOLE SODIUM 40 MG IV SOLR
40.0000 mg | Freq: Every day | INTRAVENOUS | Status: DC
  Administered 2021-05-08 – 2021-05-11 (×4): 40 mg via INTRAVENOUS
  Filled 2021-05-08 (×4): qty 40

## 2021-05-08 MED ORDER — IOHEXOL 350 MG/ML SOLN
75.0000 mL | Freq: Once | INTRAVENOUS | Status: AC | PRN
  Administered 2021-05-08: 75 mL via INTRAVENOUS

## 2021-05-08 MED ORDER — LEVETIRACETAM IN NACL 1000 MG/100ML IV SOLN
1000.0000 mg | Freq: Once | INTRAVENOUS | Status: AC
  Administered 2021-05-08: 1000 mg via INTRAVENOUS
  Filled 2021-05-08: qty 100

## 2021-05-08 MED ORDER — LORAZEPAM 2 MG/ML IJ SOLN
INTRAMUSCULAR | Status: AC
  Administered 2021-05-08: 1 mg
  Filled 2021-05-08: qty 1

## 2021-05-08 MED ORDER — ACETAMINOPHEN 650 MG RE SUPP: 650.0000 mg | Freq: Four times a day (QID) | RECTAL | Status: DC | PRN

## 2021-05-08 MED ORDER — NICARDIPINE HCL IN NACL 20-0.86 MG/200ML-% IV SOLN
3.0000 mg/h | INTRAVENOUS | Status: DC
  Administered 2021-05-08: 5 mg/h via INTRAVENOUS
  Filled 2021-05-08: qty 200

## 2021-05-08 MED ORDER — LEVETIRACETAM IN NACL 1000 MG/100ML IV SOLN
1000.0000 mg | Freq: Once | INTRAVENOUS | Status: DC
Start: 2021-05-08 — End: 2021-05-08

## 2021-05-08 MED ORDER — LEVETIRACETAM IN NACL 500 MG/100ML IV SOLN: 500.0000 mg | INTRAVENOUS | Status: DC

## 2021-05-08 MED ORDER — POLYETHYLENE GLYCOL 3350 17 G PO PACK
17.0000 g | PACK | Freq: Every day | ORAL | Status: DC | PRN
  Filled 2021-05-08: qty 1

## 2021-05-08 MED ORDER — LABETALOL HCL 5 MG/ML IV SOLN
20.0000 mg | Freq: Once | INTRAVENOUS | Status: AC
  Administered 2021-05-08: 20 mg via INTRAVENOUS
  Filled 2021-05-08: qty 4

## 2021-05-08 MED ORDER — SODIUM CHLORIDE 0.9 % IV SOLN
2000.0000 mg | INTRAVENOUS | Status: AC
  Administered 2021-05-08: 2000 mg via INTRAVENOUS
  Filled 2021-05-08: qty 20

## 2021-05-08 MED ORDER — NICARDIPINE HCL IN NACL 20-0.86 MG/200ML-% IV SOLN
3.0000 mg/h | INTRAVENOUS | Status: DC
  Administered 2021-05-08: 5 mg/h via INTRAVENOUS
  Administered 2021-05-09 – 2021-05-10 (×2): 2 mg/h via INTRAVENOUS
  Filled 2021-05-08 (×2): qty 200

## 2021-05-08 MED ORDER — DOCUSATE SODIUM 100 MG PO CAPS: 100.0000 mg | ORAL_CAPSULE | Freq: Two times a day (BID) | ORAL | Status: DC | PRN

## 2021-05-08 MED ORDER — SODIUM CHLORIDE 0.9 % IV SOLN
100.0000 mg | Freq: Two times a day (BID) | INTRAVENOUS | Status: DC
  Filled 2021-05-08 (×2): qty 10

## 2021-05-08 MED ORDER — LEVETIRACETAM IN NACL 1000 MG/100ML IV SOLN
1000.0000 mg | Freq: Two times a day (BID) | INTRAVENOUS | Status: DC
Start: 2021-05-08 — End: 2021-05-08

## 2021-05-08 NOTE — ED Notes (Signed)
Called [facility], [place], activated code stroke  [phone] per Dr. Cheri Fowler

## 2021-05-08 NOTE — ED Notes (Signed)
Report called to RN for [phone].

## 2021-05-08 NOTE — ED Notes (Signed)
Called and spoke to [facility] for transfer  1231

## 2021-05-08 NOTE — Progress Notes (Signed)
LTM EEG reviewed through 1800 w/ no e/o seizures. Neuro will continue to follow.  Su Monks, MD [facility] [phone]  If 7pm- 7am, please page neurology on call as listed in [place].

## 2021-05-08 NOTE — ED Notes (Signed)
Code stroke cancelled by Dr. Leonel Ramsay  [phone]

## 2021-05-08 NOTE — Progress Notes (Signed)
Upon entering pt room for evening shift assessment pt noted to be having seizure-like activity: slow rhythmic contractions of bilateral upper extremities and foaming at the mouth. This lasted for approx 18 min. Donnetta Simpers, MD notified of these findings. 1G keppra initially ordered but canceled after MD reviewed EEG and saw no seizure activity. Orders to continue monitoring received. Pt now back to baseline: non-verbal and only withdrawing to pain, airway self-maintained.

## 2021-05-08 NOTE — Progress Notes (Signed)
LTM EEG hooked up and running - no initial skin breakdown - push button tested - neuro notified. [facility] monitoring.  

## 2021-05-08 NOTE — ED Notes (Signed)
Pt cleaned of feces and placed in new brief.

## 2021-05-08 NOTE — ED Notes (Signed)
Pt unable to sign consent for transfer. Family gave verbal consent to transport.

## 2021-05-08 NOTE — Progress Notes (Signed)
[name] and husband at bedside.

## 2021-05-08 NOTE — ED Provider Notes (Signed)
[facility] Provider Note    Event Date/Time   First MD Initiated Contact with Patient 05/08/21 [phone]     (approximate)   History   Altered Mental Status   HPI  Amy [name] is a 70 y.o. female with a history of subdural hemorrhage status postevacuation in March, 2022 and subsequent seizure activity who presents from a long-term care facility via EMS due to altered mental status.  Patient was in her normal state of health at approximately 7 AM and when folks checked on her again this morning they found her to be fairly unresponsive.  Patient was breathing on her own and never lost pulses but she has had GCS of approximately 9 since EMS initial evaluation.  Further history and review of systems are unable to be obtained at this time due to patient's mental status     Physical Exam   Triage Vital Signs: ED Triage Vitals  Enc Vitals Group     BP 05/08/21 0859 (!) 225/88     Pulse Rate 05/08/21 0859 63     Resp 05/08/21 0859 14     Temp 05/08/21 0948 98 F (36.7 C)     Temp Source 05/08/21 0948 Axillary     SpO2 05/08/21 0859 97 %     Weight 05/08/21 0859 116 lb 10 oz (52.9 kg)     Height 05/08/21 0859 5\' 4"  (1.626 m)     Head Circumference --      Peak Flow --      Pain Score 05/08/21 0859 0     Pain Loc --      Pain Edu? --      Excl. in [place]? --     Most recent vital signs: Vitals:   05/08/21 1500 05/08/21 1510  BP: (!) 202/88 (!) 154/86  Pulse: (!) 59 60  Resp: 15 14  Temp:    SpO2: 98% 98%    General: Eyes closed in bed on the stretcher CV:  Good peripheral perfusion.  Resp:  Normal effort.  Abd:  No distention.  Other:  GCS 9   ED Results / Procedures / Treatments   Labs (all labs ordered are listed, but only abnormal results are displayed) Labs Reviewed  PROTIME-INR - Abnormal; Notable for the following components:      Result Value   Prothrombin Time 39.1 (*)    INR 4.0 (*)    All other components within normal limits   APTT - Abnormal; Notable for the following components:   aPTT 54 (*)    All other components within normal limits  CBC - Abnormal; Notable for the following components:   WBC 11.3 (*)    RBC 3.60 (*)    Hemoglobin 10.9 (*)    HCT 33.0 (*)    RDW 16.4 (*)    All other components within normal limits  DIFFERENTIAL - Abnormal; Notable for the following components:   Neutro Abs 8.9 (*)    All other components within normal limits  COMPREHENSIVE METABOLIC PANEL - Abnormal; Notable for the following components:   Sodium 129 (*)    Chloride 97 (*)    Glucose, Bld 102 (*)    BUN 32 (*)    Creatinine, Ser 3.39 (*)    Calcium 8.7 (*)    Total Protein 5.3 (*)    Albumin 2.5 (*)    GFR, Estimated 14 (*)    All other components within normal limits  RESP PANEL BY  RT-PCR (FLU A&B, COVID) ARPGX2  ETHANOL  URINE DRUG SCREEN, QUALITATIVE ([facility] ONLY)  URINALYSIS, ROUTINE W REFLEX MICROSCOPIC  CBG MONITORING, ED     EKG ED ECG REPORT I, Naaman Plummer, the attending physician, personally viewed and interpreted this ECG.  Date: 05/08/2021 EKG Time: 0941 Rate: 69 Rhythm: normal sinus rhythm QRS Axis: normal Intervals: normal ST/T Wave abnormalities: normal Narrative Interpretation: no evidence of acute ischemia   RADIOLOGY ED MD interpretation: CT chest with IV contrast shows a large left and moderate right pleural effusion with near complete collapse of the left lower lobe as well as mild pulmonary interstitial edema multiple thyroid nodules, and aortic atherosclerosis  CT without contrast of the head shows no acute infarct with an aspect score of 10.  Agree with radiology evaluation  CT angiography of the head and neck is negative for large vessel occlusion and only shows minimal extracranial atherosclerosis but extensive ICA siphon calcified plaque with moderate left and moderate to severe right siphon stenosis as well as mild to moderate left vertebral V4 stenosis due to calcified  plaque  Official radiology report(s): CT CHEST W CONTRAST  Result Date: 05/08/2021 CLINICAL DATA:  Altered mental status.  Pleural effusion. EXAM: CT CHEST WITH CONTRAST TECHNIQUE: Multidetector CT imaging of the chest was performed during intravenous contrast administration. CONTRAST:  71mL OMNIPAQUE IOHEXOL 350 MG/ML SOLN COMPARISON:  Chest x-ray dated December 22, 2020. CTA chest dated June 23, 2020. FINDINGS: Cardiovascular: Unchanged borderline cardiomegaly. No pericardial effusion. No thoracic aortic aneurysm or dissection. No central pulmonary embolism. Coronary, aortic arch, and branch vessel atherosclerotic vascular disease. New tunneled right internal jugular dialysis catheter with tip at the cavoatrial junction. Mediastinum/Nodes: No enlarged mediastinal, hilar, or axillary lymph nodes. Unchanged 1.5 cm hypodense thyroid nodules in the right thyroid lobe and isthmus. Trachea and esophagus demonstrate no significant findings. Lungs/Pleura: Large left and moderate right pleural effusions. Near complete collapse of the left lower lobe. Dependent subsegmental atelectasis in the posterior right lower lobe. Upper lobe predominant smooth interlobular septal thickening. No consolidation or pneumothorax. Central airways are patent. Upper Abdomen: No acute abnormality. Unchanged nodular liver contour with multiple small hepatic cysts. Unchanged bilateral adrenal gland thickening and left upper pole renal calculi. Unchanged small hiatal hernia. Musculoskeletal: Diffuse anasarca. No acute or significant osseous findings. IMPRESSION: 1. Large left and moderate right pleural effusions with near complete collapse of the left lower lobe. 2. Mild pulmonary interstitial edema. 3. Multiple thyroid nodules which have slowly increased in size since 2018. Recommend thyroid US (ref: J Am Coll Radiol. 2015 Feb;12(2): 143-50). 4. Aortic Atherosclerosis (ICD10-I70.0). Electronically Signed   By: Titus Dubin M.D.   On:  05/08/2021 10:08   CT HEAD CODE STROKE WO CONTRAST  Result Date: 05/08/2021 CLINICAL DATA:  Code stroke. 71 year old female with altered mental status. Dialysis patient. EXAM: CT HEAD WITHOUT CONTRAST TECHNIQUE: Contiguous axial images were obtained from the base of the skull through the vertex without intravenous contrast. COMPARISON:  Brain MRI 06/20/2020.  Head CT 12/22/2020. FINDINGS: Brain: Chronic dural thickening and small low-density extra-axial collection underlying a left craniotomy is stable to decreased since August (series 3, image 16). No midline shift, mass effect, or evidence of intracranial mass lesion. No acute intracranial hemorrhage identified. No ventriculomegaly. Extensive Patchy and confluent bilateral cerebral white matter hypodensity. And there is a small area of chronic appearing encephalomalacia in the posteroinferior left temporal lobe (series 3, image 12) along the posterior margin of the craniotomy. Elsewhere stable gray-white matter  differentiation. No cortically based acute infarct identified. No suspicious intracranial vascular hyperdensity. Vascular: Calcified atherosclerosis at the skull base. Skull: Chronic left side craniotomy is stable. No acute osseous abnormality identified. Sinuses/Orbits: Visualized paranasal sinuses and mastoids are stable and well aerated. Other: No acute orbit or scalp soft tissue finding. ASPECTS [facility] Stroke Program Early CT Score) Total score (0-10 with 10 being normal): 10, chronic left temporal lobe encephalomalacia suspected. IMPRESSION: 1. No acute cortically based infarct or acute intracranial hemorrhage identified. ASPECTS 10. 2. Previous left side craniotomy and subdural evacuation with stable to decreased dural thickening, hygroma. 3. Left inferior temporal lobe encephalomalacia and advanced chronic small vessel disease. 4. These results were communicated to Dr. Leonel Ramsay at 9:41 am on 05/08/2021 by text page via the [facility] messaging  system. Electronically Signed   By: Genevie Ann M.D.   On: 05/08/2021 09:42   CT ANGIO HEAD NECK W WO CM (CODE STROKE)  Result Date: 05/08/2021 CLINICAL DATA:  Code stroke. 71 year old female with altered mental status. Dialysis patient. EXAM: CT ANGIOGRAPHY HEAD AND NECK TECHNIQUE: Multidetector CT imaging of the head and neck was performed using the standard protocol during bolus administration of intravenous contrast. Multiplanar CT image reconstructions and MIPs were obtained to evaluate the vascular anatomy. Carotid stenosis measurements (when applicable) are obtained utilizing NASCET criteria, using the distal internal carotid diameter as the denominator. CONTRAST:  21mL OMNIPAQUE IOHEXOL 350 MG/ML SOLN COMPARISON:  Plain head CT 0912 hours today. CTA head and neck 06/19/2020. chest CT today reported separately. FINDINGS: CTA NECK Skeleton: Chronic lower cervical spine degeneration. Sequelae of left craniotomy is new from the prior CTA. No acute osseous abnormality identified. Upper chest: Large layering left pleural effusion with compressive left lung atelectasis, new from 06/19/2020. Small layering right pleural effusion. Mild apical septal thickening bilaterally. Other neck: Right IJ approach dual lumen dialysis type catheter. Mild generalized body wall edema is new from the prior CTA. Chronic thyroid nodules, up to 2 cm appear stable or decreased. Recommend thyroid US (ref: J Am Coll Radiol. 2015 Feb;12(2): 143-50). Otherwise negative neck soft tissues. Aortic arch: 3 vessel arch configuration.  No arch atherosclerosis. Right carotid system: Negative right CCA. Minimal right ICA origin plaque. Tortuous right ICA distal to the bulb but no stenosis. Left carotid system: Minimal plaque at the left ICA origin. Mild tortuosity without stenosis. Vertebral arteries: Negative proximal right subclavian artery and right vertebral artery origin. Mildly tortuous right vertebral is widely patent to the skull base.  Negative proximal left subclavian artery and left vertebral artery origin. Mildly tortuous left vertebral artery is widely patent to the skull base. CTA HEAD Posterior circulation: Chronic left vertebral artery V4 calcified plaque proximal to the normal PICA origin. Stable mild-to-moderate stenosis there (series 7, image 128). Mild right V4 calcified plaque without significant stenosis. Patent vertebrobasilar junction. Normal right PICA. Patent basilar artery without stenosis. Normal SCA and PCA origins. Posterior communicating arteries are diminutive or absent. Bilateral PCA branches are stable and within normal limits. Anterior circulation: Both ICA siphons are patent. Extensive bilateral siphon calcified plaque. Moderate distal cavernous and supraclinoid segment stenosis appears stable from last year. Similar moderate supraclinoid stenosis appears stable, although distal cavernous stenosis may have increased on series 7, image 101, moderate to severe. Patent carotid termini. Patent MCA and ACA origins. Diminutive or absent anterior communicating artery. Bilateral ACA branches are within normal limits. Right MCA M1 segment and trifurcation are patent without stenosis. Right MCA branches are stable and within normal limits.  Mild irregularity and stenosis at the left MCA origin is stable. Left MCA M1 segment and trifurcation otherwise patent without stenosis. Left MCA branches are stable and within normal limits. Venous sinuses: Patent. Anatomic variants: None. Review of the MIP images confirms the above findings IMPRESSION: 1. Negative for large vessel occlusion. 2. Minimal extracranial atherosclerosis. But extensive ICA siphon calcified plaque with moderate left and moderate to severe right siphon stenosis. Mild to moderate left vertebral V4 stenosis due to calcified plaque. 3. Large layering left pleural effusion with compressive atelectasis, new from CTA 06/19/2020. See Chest CT reported separately. 4. Chronic  thyroid nodules, which meet consensus criteria for follow-up Thyroid Ultrasound (ref: J Am Coll Radiol. 2015 Feb;12(2): 143-50). Salient findings were communicated to Dr. Leonel Ramsay at 9:53 am on 05/08/2021 by text page via the [facility] messaging system. Electronically Signed   By: Genevie Ann M.D.   On: 05/08/2021 09:54      PROCEDURES:  Critical Care performed: Yes, see critical care procedure note(s)  Procedures   MEDICATIONS ORDERED IN ED: Medications  levETIRAcetam (KEPPRA) IVPB 1000 mg/100 mL premix (0 mg Intravenous Stopped 05/08/21 0958)  labetalol (NORMODYNE) injection 20 mg (20 mg Intravenous Given 05/08/21 0928)  lacosamide (VIMPAT) 200 mg in sodium chloride 0.9 % 25 mL IVPB (0 mg Intravenous Stopped 05/08/21 1156)  LORazepam (ATIVAN) 2 MG/ML injection (1 mg  Given 05/08/21 0931)  iohexol (OMNIPAQUE) 350 MG/ML injection 75 mL (75 mLs Intravenous Contrast Given 05/08/21 0932)  levETIRAcetam (KEPPRA) 2,000 mg in sodium chloride 0.9 % 250 mL IVPB (0 mg Intravenous Stopped 05/08/21 1455)     IMPRESSION / MDM / [place] / ED COURSE  I reviewed the triage vital signs and the nursing notes.                              Differential diagnosis includes, but is not limited to, CVA, TIA, status epilepticus, DKA  The patient is on the cardiac monitor to evaluate for evidence of arrhythmia and/or significant heart rate changes.  Patient is 71 year old female with the above-stated past medical history presents for altered mental status from her long-term care facility.  Differential diagnosis as above.  Patient was initially called as a code stroke and was evaluated at bedside by neurology, Dr. Leonel Ramsay.  Dr. Saralyn Pilar called off the code stroke and no thrombolytics given due to patient having clean CT and CTAs as well as mental status somewhat improving.  Throughout approximately an hour after patient arrived she did have increasing mental status including being able to follow commands however  her mental status began to decline once her blood pressure was increased once again.  Patient was given labetalol upon arrival with improvement of her blood pressure initially and was subsequently placed on a Cardene drip for blood pressure control.  Patient was also given empiric antiepileptic medications including a milligram of Ativan, 60 mg/kg of Keppra, and 200 mg of Vimpat.  A spot EEG did not show any evidence of acute epileptiform activity.  I spoke to the neuro ICU team at [facility] agrees to except this patient in transfer for further evaluation and management.  Dispo: Ad[facility] to [facility]      FINAL CLINICAL IMPRESSION(S) / ED DIAGNOSES   Final diagnoses:  Altered mental status, unspecified altered mental status type     Rx / DC Orders   ED Discharge Orders     None  Note:  This document was prepared using Dragon voice recognition software and may include unintentional dictation errors.   Naaman Plummer, MD 05/08/21 [phone]

## 2021-05-08 NOTE — H&P (Addendum)
NAME:  Amy [name], MRN:  [id number], DOB:  Sep 13, 1950, LOS: 0 ADMISSION DATE:  05/08/2021, CONSULTATION DATE:  05/08/21 REFERRING MD:  [facility], CHIEF COMPLAINT:  AMS   History of Present Illness:  71 year old woman with hx of subdural hematoma and prior seizures who presents from SNF with decreased responsiveness.  Evaluated by neurology at [facility] who evaluated a spot EEG that raised concern for ongoing seizures so transferred to [facility] for formal management.  History per chart review as patient is encephalopathic.  Other workup in ER revealed slightly high INR, new large left pleural effusion.  Pertinent  Medical History  Anxiety GERD HTN Hyponatremia in 2018 attributed to HCTZ Subdural 2022 CAD with prior DES Ischemic cardiomyopathy ESRD on HD Malfunctioning tunneled catheter on warfarin?  Cardiologist note seems confused by this too  Significant Hospital Events: Including procedures, antibiotic start and stop dates in addition to other pertinent events   1/6 ER admit  Interim History / Subjective:  Patient in no acute distress On cardene drip Patient lethargic; responds to painful stimuli  Objective   Blood pressure (!) 152/83, pulse (!) 59, temperature 98 F (36.7 C), temperature source Oral, resp. rate 12, SpO2 95 %.       No intake or output data in the 24 hours ending 05/08/21 1634 There were no vitals filed for this visit.  Examination: General: confused elderly woman laying in bed HENT: MMM, trachea midline Lungs: diminished at bases, large L effusion Cardiovascular: Slightly bradycardic, ext warm Abdomen: soft, +BS Extremities: no edema, +muscle wasting Neuro: Withdraws x 4 Skin: no rashes  Resolved Hospital Problem list   N/a  Assessment & Plan:  Acute metabolic encephalopathy r/o status epilepticus Subdural hematoma ~9 mo ago P: -Neuro following -admitted to neuro icu for LTM monitoring -frequent neuro checks; avoid sedating medications -anti-epileptics  per neuro -seizure precautions in place -CBG monitoring -avoid fevers -follow encephalopathy labs: UA, UDS, ammonia  Hypertensive urgency vs. Emergency on cardene drip Hx of CAD w/ prior DES Hx of ischemic cardiomyopathy P: -per neuro; SBP goal 140-180 -continue cardene drip -hold home bp meds  Acute on chronic pleural effusion: large left and moderate right P: -will need chest tube; INR elevated so will reevaluate chest tube placment tomorrow -will check pleural cultures -trend CXR  ESRD on HD dialysis P: -missed dialysis today - nephro consulted -possible HD tomorrow 1/7 -Trend BMP / urinary output -Replace electrolytes as indicated -Avoid nephrotoxic agents, ensure adequate renal perfusion  Hyponatremia: hx of hyponatremia P: -trend bmp  Supratherapeutic INR Warfarin use for ? HD catheter patency P: -INR elevated; will hold warfarin at this time -Daily INR -SCDs for dvt ppx  Leukocytosis P: -trend wbc -currently afebrile -will check UA and follow pleural cultures  Hx of anxiety P: -hold home meds  GERD P: -PPI  Anemia: likely chronic due to ESRD P: -trend CBC  HLD P: -statin on hold while npo  Best Practice (right click and "Reselect all SmartList Selections" daily)   Diet/type: NPO DVT prophylaxis: SCD GI prophylaxis: PPI Lines: N/A Foley:  N/A Code Status:  DNR Last date of multidisciplinary goals of care discussion [1/6 husband and daughter at bedside; they state that Amy [name] is a DNR and would not want to be intubated.]  Labs   CBC: Recent Labs  Lab 05/08/21 0919  WBC 11.3*  NEUTROABS 8.9*  HGB 10.9*  HCT 33.0*  MCV 91.7  PLT 174    Basic Metabolic Panel: Recent Labs  Lab 05/08/21 0919  NA 129*  K 3.5  CL 97*  CO2 26  GLUCOSE 102*  BUN 32*  CREATININE 3.39*  CALCIUM 8.7*   GFR: Estimated Creatinine Clearance: 12.5 mL/min (A) (by C-G formula based on SCr of 3.39 mg/dL (H)). Recent Labs  Lab 05/08/21 0919   WBC 11.3*    Liver Function Tests: Recent Labs  Lab 05/08/21 0919  AST 17  ALT 11  ALKPHOS 108  BILITOT 0.7  PROT 5.3*  ALBUMIN 2.5*   No results for input(s): LIPASE, AMYLASE in the last 168 hours. No results for input(s): AMMONIA in the last 168 hours.  ABG    Component Value Date/Time   HCO3 17.1 (L) 07/25/2020 0035   TCO2 18 (L) 06/19/2020 1617   ACIDBASEDEF 8.2 (H) 07/25/2020 0035   O2SAT 58.6 07/25/2020 0035     Coagulation Profile: Recent Labs  Lab 05/08/21 0919  INR 4.0*    Cardiac Enzymes: No results for input(s): CKTOTAL, CKMB, CKMBINDEX, TROPONINI in the last 168 hours.  HbA1C: No results found for: HGBA1C  CBG: Recent Labs  Lab 05/08/21 0859  GLUCAP 97    Review of Systems:   Unable to obtain. Patient altered and not able to speak.  Past Medical History:  She,  has a past medical history of Anemia, Anxiety, Closed fracture of shaft of left ulna (11/27/2018), Colon polyp, ESRD on peritoneal dialysis ([place]), Gastritis, GERD (gastroesophageal reflux disease), History of chicken pox, History of measles as a child, Hypertension, Hyponatremia, Leukocytosis, Mesenteric lymphadenopathy (04/08/2017), and Multiple thyroid nodules.   Surgical History:   Past Surgical History:  Procedure Laterality Date   ABDOMINAL HYSTERECTOMY  2003   due to heavy periods and clotting during menses   BREAST LUMPECTOMY Left 1990   CHOLECYSTECTOMY  2010   COLONOSCOPY WITH PROPOFOL N/A 09/07/2016   Procedure: COLONOSCOPY WITH PROPOFOL;  Surgeon: Lucilla Lame, MD;  Location: [facility] ENDOSCOPY;  Service: Endoscopy;  Laterality: N/A;   CORONARY STENT INTERVENTION N/A 06/27/2020   Procedure: CORONARY STENT INTERVENTION;  Surgeon: Sherren Mocha, MD;  Location: [place] [facility];  Service: Cardiovascular;  Laterality: N/A;   DIALYSIS/PERMA CATHETER INSERTION N/A 11/16/2018   Procedure: DIALYSIS/PERMA CATHETER INSERTION;  Surgeon: Algernon Huxley, MD;  Location: [place] [facility];   Service: Cardiovascular;  Laterality: N/A;   DIALYSIS/PERMA CATHETER INSERTION N/A 11/10/2020   Procedure: DIALYSIS/PERMA CATHETER EXCHANGE;  Surgeon: Algernon Huxley, MD;  Location: [place] [facility];  Service: Cardiovascular;  Laterality: N/A;   DIALYSIS/PERMA CATHETER INSERTION N/A 12/24/2020   Procedure: DIALYSIS/PERMA CATHETER INSERTION / EXCHANGE;  Surgeon: Algernon Huxley, MD;  Location: [place] [facility];  Service: Cardiovascular;  Laterality: N/A;   DIALYSIS/PERMA CATHETER INSERTION N/A 01/19/2021   Procedure: DIALYSIS/PERMA CATHETER INSERTION;  Surgeon: Algernon Huxley, MD;  Location: [place] [facility];  Service: Cardiovascular;  Laterality: N/A;   DIALYSIS/PERMA CATHETER REMOVAL N/A 08/16/2019   Procedure: DIALYSIS/PERMA CATHETER REMOVAL;  Surgeon: Algernon Huxley, MD;  Location: [place] [facility];  Service: Cardiovascular;  Laterality: N/A;   DILATION AND CURETTAGE OF UTERUS  1985   ESOPHAGOGASTRODUODENOSCOPY (EGD) WITH PROPOFOL N/A 09/07/2016   Procedure: ESOPHAGOGASTRODUODENOSCOPY (EGD) WITH PROPOFOL;  Surgeon: Lucilla Lame, MD;  Location: [facility] ENDOSCOPY;  Service: Endoscopy;  Laterality: N/A;   GALLBLADDER SURGERY  2012   INTRAMEDULLARY (IM) NAIL INTERTROCHANTERIC Right 04/23/2020   Procedure: INTRAMEDULLARY (IM) NAIL INTERTROCHANTRIC;  Surgeon: Thornton Park, MD;  Location: [facility] ORS;  Service: Orthopedics;  Laterality: Right;  INTRAVASCULAR IMAGING/OCT N/A 06/27/2020   Procedure: INTRAVASCULAR IMAGING/OCT;  Surgeon: Sherren Mocha, MD;  Location: [place] [facility];  Service: Cardiovascular;  Laterality: N/A;   IR FLUORO GUIDE CV LINE RIGHT  09/04/2020   IR US GUIDE VASC ACCESS RIGHT  09/04/2020   KNEE SURGERY     Dr. Tamala Julian   LEFT HEART CATH AND CORONARY ANGIOGRAPHY N/A 06/25/2020   Procedure: LEFT HEART CATH AND CORONARY ANGIOGRAPHY;  Surgeon: Troy Sine, MD;  Location: [place] [facility];  Service: Cardiovascular;  Laterality: N/A;   TONSILLECTOMY     TONSILLECTOMY AND  ADENOIDECTOMY  1970   UPPER GI ENDOSCOPY  03/02/2006   H. Pylori negative; Dr. Allen Norris     Social History:   reports that she has never smoked. She has never used smokeless tobacco. She reports current alcohol use. She reports that she does not use drugs.   Family History:  Her family history includes Cancer in her paternal grandfather; Congestive Heart Failure in her maternal grandmother; Heart disease in her maternal uncle; Pancreatic cancer in her father.   Allergies Allergies  Allergen Reactions   Cephalosporins Hives and Swelling   Clarithromycin Other (See Comments)    Unknown reaction   Ensure [Nutritional Supplements] Nausea And Vomiting   Gabapentin    Lunesta  [Eszopiclone]     Heart racing   Nepro [Compleat] Nausea And Vomiting   Penicillin V     Has patient had a PCN reaction causing immediate rash, facial/tongue/throat swelling, SOB or lightheadedness with hypotension: Yes Has patient had a PCN reaction causing severe rash involving mucus membranes or skin necrosis: No Has patient had a PCN reaction that required hospitalization No Has patient had a PCN reaction occurring within the last 10 years: No If all of the above answers are "NO", then may proceed with Cephalosporin use.   Sulfa Antibiotics     Unknown reaction     Home Medications  Prior to Admission medications   Medication Sig Start Date End Date Taking? Authorizing Provider  acetaminophen (TYLENOL) 325 MG tablet Take 1-2 tablets (325-650 mg total) by mouth every 4 (four) hours as needed for mild pain. Patient taking differently: Take 325-650 mg by mouth as needed for mild pain, fever or headache. 09/12/20   Love, Ivan Anchors, PA-C  atorvastatin (LIPITOR) 40 MG tablet Take 1 tablet (40 mg total) by mouth daily. 09/13/20   Love, Ivan Anchors, PA-C  bismuth subsalicylate (KAOPECTATE) 262 MG/15ML suspension Take 30 mLs by mouth as needed for diarrhea or loose stools.    [provider]  brivaracetam (BRIVIACT)  25 MG TABS tablet Take 3 tablets (75 mg total) by mouth 2 (two) times daily. Patient not taking: Reported on 05/08/2021 09/12/20   Bary Leriche, PA-C  calcium carbonate (TUMS EX) 750 MG chewable tablet Chew 1-2 tablets by mouth as needed for heartburn.    [provider]  camphor-menthol Timoteo Ace) lotion Apply topically as needed for itching. 09/12/20   Love, Ivan Anchors, PA-C  Chlorhexidine Gluconate Cloth 2 % PADS Apply 6 each topically daily. Patient not taking: Reported on 05/08/2021 09/13/20   Love, Ivan Anchors, PA-C  Darbepoetin Alfa (ARANESP) 60 MCG/0.3ML SOSY injection Inject 0.3 mLs (60 mcg total) into the vein every Saturday with hemodialysis. 09/20/20   Love, Ivan Anchors, PA-C  diphenhydrAMINE (BENADRYL) 25 mg capsule Take 25-50 mg by mouth every 6 (six) hours as needed for itching or allergies.    [provider]  docusate sodium (COLACE)  100 MG capsule Take 1 capsule (100 mg total) by mouth 2 (two) times daily. 09/12/20   Love, Ivan Anchors, PA-C  ezetimibe (ZETIA) 10 MG tablet Take 1 tablet (10 mg total) by mouth daily. 02/24/21 05/25/21  Minna Merritts, MD  hydrALAZINE (APRESOLINE) 25 MG tablet Take 1 tablet (25 mg total) by mouth 3 (three) times daily. May take an extra 25 mg for SBP >160 02/24/21 05/25/21  Minna Merritts, MD  hydrOXYzine (ATARAX/VISTARIL) 10 MG tablet Take 10 mg by mouth 3 (three) times daily. 01/07/21   [provider]  lacosamide (VIMPAT) 50 MG TABS tablet Take 1 tablet (50 mg total) by mouth 2 (two) times daily. 09/12/20   Love, Ivan Anchors, PA-C  loperamide (IMODIUM) 2 MG capsule Take 2 mg by mouth as needed for diarrhea or loose stools. May repeat after each loose stool X 2.    [provider]  losartan (COZAAR) 50 MG tablet Take 50 mg by mouth daily. 04/21/21   [provider]  melatonin 5 MG TABS Take 1 tablet (5 mg total) by mouth at bedtime. 09/12/20   Love, Ivan Anchors, PA-C  methocarbamol (ROBAXIN) 500 MG tablet Take 1 tablet (500 mg  total) by mouth every 6 (six) hours as needed for muscle spasms. 09/12/20   Love, Ivan Anchors, PA-C  metoprolol tartrate (LOPRESSOR) 25 MG tablet Take 12.5 mg by mouth 2 (two) times daily. 12/12/20   [provider]  multivitamin (RENA-VIT) TABS tablet Take 1 tablet by mouth at bedtime. 09/12/20   Love, Ivan Anchors, PA-C  nitroGLYCERIN (NITROSTAT) 0.4 MG SL tablet Place 0.4 mg under the tongue every 5 (five) minutes as needed for chest pain. X 2 more doses.    [provider]  ondansetron (ZOFRAN) 4 MG tablet Take 4 mg by mouth every 8 (eight) hours as needed. 12/22/20   [provider]  pantoprazole (PROTONIX) 40 MG tablet TAKE 1 TABLET (40 MG TOTAL) BY MOUTH DAILY. 06/30/20 06/30/21  Geradine Girt, DO  sertraline (ZOLOFT) 50 MG tablet Take 50 mg by mouth daily. 12/15/20   [provider]  traZODone (DESYREL) 100 MG tablet Take 100 mg by mouth at bedtime. 04/22/21   [provider]  traZODone (DESYREL) 50 MG tablet Take 0.5-1 tablets (25-50 mg total) by mouth at bedtime as needed for sleep. Patient not taking: Reported on 05/08/2021 09/12/20   Love, Ivan Anchors, PA-C  triamcinolone cream (KENALOG) 0.1 % Apply topically 3 (three) times daily. 09/12/20   Love, Ivan Anchors, PA-C  valACYclovir (VALTREX) 1000 MG tablet Take 1,000 mg by mouth daily. 05/06/21   [provider]  warfarin (COUMADIN) 1 MG tablet Take 1 mg by mouth daily.    [provider]     Combined critical care time: 45 minutes    Joint note by Erskine Emery MD and JD Rexene Agent [facility] 05/08/2021, 5:22 PM  Please see [email or web address] for pager details.  From 7A-7P if no response, please call [phone]. After hours, please call [facility]Link [phone].

## 2021-05-08 NOTE — Procedures (Signed)
History: 71 year old female presenting with unresponsiveness that aborted with Ativan  Sedation: Recent 1 mg Ativan  Technique: This EEG was acquired with electrodes placed according to the International 10-20 electrode system (including Fp1, Fp2, F3, F4, C3, C4, P3, P4, O1, O2, T3, T4, T5, T6, A1, A2, Fz, Cz, Pz). The following electrodes were missing or displaced: none.   Background: There is a posterior dominant rhythm of 8 Hz which is poorly sustained but seen with arousal.  In addition, there is generalized irregular slow activity intruding into the background, as well as a superimposed focal irregular 3 to 4 Hz slow wave activity in the left hemisphere maximal in the left temporal region.  Photic stimulation: Physiologic driving is not performed  EEG Abnormalities: 1) left temporal slow activity 2) generalized irregular slow activity  Clinical Interpretation: This EEG was consistent with a nonspecific left temporal focal cerebral dysfunction in the setting of a more generalized nonspecific cerebral dysfunction (encephalopathy).   These findings are nonspecific, but can be seen with structural lesions, postictal state, among other causes.  There was no seizure or seizure predisposition recorded on this study. Please note that lack of epileptiform activity on EEG does not preclude the possibility of epilepsy.   Roland Rack, MD [facility] [phone]  If 7pm- 7am, please page neurology on call as listed in [place].

## 2021-05-08 NOTE — ED Notes (Signed)
[facility] denied transfer due to at capacity

## 2021-05-08 NOTE — Code Documentation (Addendum)
Stroke Response Nurse Documentation Code Documentation  Amy [name] is a 71 y.o. female arriving to [facility] ED via [facility] on 1/6/ with past medical hx of SDH and seizures. Code stroke was activated by ED RN.   Patient from [facility] where she was LKW at 0700 and now complaining of altered mental status. Based off of staff report, pt was able to wake up her normal self and eat breakfast, was speaking. At 0800 staff noted that pt was altered and was aphasic.    Stroke team at the bedside on patient arrival. Labs drawn and patient cleared for CT by Dr. Cheri Fowler. Patient to CT with team. NIHSS 17, see documentation for details and code stroke times. CT Head and CTA head and neck completed.Patient is not a candidate for IV Thrombolytic per Dr Leonel Ramsay. Per MD pt appears to be having seizure like activity and will treat as so. In my time with patient she was administered labetalol, ativan and keppra.  Care/Plan: Pt to be admitted and  have EEG performed.   Bedside handoff with ED RN Angelina Pih Stroke Coordinator RN

## 2021-05-08 NOTE — Progress Notes (Signed)
BP elevated, cardene gtt started.

## 2021-05-08 NOTE — ED Triage Notes (Signed)
BIB [facility] from [facility] for AMS. Staff advised pt is altered and not responding appropriately. Pt started new med for shingles yesterday. Due for dialysis today. Pt is not baseline per staff.  231/ 65 Staff said O2 was low 90s. For ems was normal. BGL 114

## 2021-05-08 NOTE — ED Notes (Signed)
Called [facility], [place], activated code stroke  [phone]

## 2021-05-08 NOTE — Progress Notes (Signed)
[facility] Progress Note Patient Name: Amy [name] DOB: 1951/04/03 MRN: [id number]   Date of Service  05/08/2021  HPI/Events of Note  Patient with hypothermia.  eICU Interventions  Bair hugger ordered targeting normothermia        Frederik Pear 05/08/2021, 11:53 PM

## 2021-05-08 NOTE — ED Notes (Signed)
[facility] called for report. Amy [name].

## 2021-05-08 NOTE — ED Notes (Signed)
Called [facility] for transfer  spoke to [place]  also images powershared to [place]

## 2021-05-08 NOTE — Consult Note (Signed)
Neurology Consultation Reason for Consult: Unresponsiveness Referring Physician: Cheri Fowler, E  CC: Unresponsiveness  History is obtained from: Patient  HPI: Amy [name] is a 71 y.o. female with a history of subdural hematoma in March of last year with subsequent seizures managed on Vimpat 50 twice daily as monotherapy.  She was in her normal state of health yesterday, and then this morning she was found to have decreased responsiveness.  Her eyes were open, but she would not respond to the nurse tech that was trying to get her ready for breakfast.  Initially it was thought that she may just have trouble waking up, but when she continued to be unresponsive EMS was called..  She was evaluated emergently by me and taken to CT for further evaluation.  CT and CT angiogram were obtained which only demonstrated her chronic subdural hematoma.  Given concern for status epilepticus, we treated with 1 mg of IV Ativan as well as Keppra 1 g given his availability.  Following administration of Ativan, she began following commands and started to improve suggesting that this did indeed represent status epilepticus responsive to Ativan.   LKW: 1/5 prior to bed tpa given?: no, SDH    ROS: Unable to obtain due to altered mental status.   Past Medical History:  Diagnosis Date   Anemia    a. 08/2016 Guaiac + stool. EGD w/ gastritis.   Anxiety    Closed fracture of shaft of left ulna 11/27/2018   Colon polyp    a. 08/2016 Colonoscopy.   ESRD on peritoneal dialysis ([place])    Gastritis    a. 08/2016 EGD: Gastritis-->PPI.   GERD (gastroesophageal reflux disease)    History of chicken pox    History of measles as a child    Hypertension    Hyponatremia    a. 08/2016 in setting of HCTZ Rx.   Leukocytosis    Mesenteric lymphadenopathy 04/08/2017   Multiple thyroid nodules      Family History  Problem Relation Age of Onset   Pancreatic cancer Father    Congestive Heart Failure Maternal Grandmother     Cancer Paternal Grandfather    Heart disease Maternal Uncle      Social History:  reports that she has never smoked. She has never used smokeless tobacco. She reports current alcohol use. She reports that she does not use drugs.   Exam: Current vital signs: BP (!) 225/88 (BP Location: Right Arm)    Pulse 63    Resp 14    Ht 5\' 4"  (1.626 m)    Wt 52.9 kg    SpO2 97%    BMI 20.02 kg/m  Vital signs in last 24 hours: Pulse Rate:  [63] 63 (01/06 0859) Resp:  [14] 14 (01/06 0859) BP: (225)/(88) 225/88 (01/06 0859) SpO2:  [97 %] 97 % (01/06 0859) Weight:  [52.9 kg] 52.9 kg (01/06 0859)   Physical Exam  Constitutional: Appears elderly Psych: Affect appropriate to situation Eyes: No scleral injection HENT: No OP obstruction, no obvious signs of injury MSK: no joint deformities.  Cardiovascular: Normal rate and regular rhythm.  Respiratory: Effort normal, non-labored breathing GI: Soft.  No distension. There is no tenderness.  Skin: Rash appearing consistent with zoster on the right side of her upper lip  Neuro: Mental Status: Patient is awake, she does not follow commands or engage with examiner.  Eyes are fixed straight ahead Cranial Nerves: II: She occasionally appears to blink to confrontation, but not reliably. Pupils are  equal, round, and reactive to light.   III,IV, VI: Eyes are midline, doll's eye intact Motor: She moves all extremities spontaneously, but not to command sensory: She response to noxious simulation in all four extremities Cerebellar: She does not perform    I have reviewed labs in epic and the results pertinent to this consultation are: Cbg 97  I have reviewed the images obtained: CT head- chronic L subdural hematoma  Impression: 71 yo F with seizure secondary to chronic left subdural hematoma who presents with unresponsiveness in setting of having previously weaned brivaracetam.  My suspicion is that this does represent breakthrough status epilepticus.   She appears to have improved following administration of 1 mg of Ativan.  She is on a fairly low dose of lacosamide and therefore I would favor increasing this dose prior to adding second agent.  Recommendations: 1) Ativan 1 mg, Keppra 1 g (already given) 2) stat EEG 3) Vimpat 200 mg x 1 followed by increase of dose to 100 mg twice daily 4) neurology will continue to follow   This patient is critically ill and at significant risk of neurological worsening, death and care requires constant monitoring of vital signs, hemodynamics,respiratory and cardiac monitoring, neurological assessment, discussion with family, other specialists and medical decision making of high complexity. I spent 35 minutes of neurocritical care time  in the care of  this patient. This was time spent independent of any time provided by nurse practitioner or PA.  Roland Rack, MD [facility] [phone]  If 7pm- 7am, please page neurology on call as listed in AMION. 05/08/2021  9:45 AM

## 2021-05-08 NOTE — Progress Notes (Signed)
Pt arrived to [address]. Eyes open, w/d all extremities from pain. Not able to f/c. Said "ouch" to nail bed pressure. CCM and Neurology notified of pt's arrival.

## 2021-05-08 NOTE — Progress Notes (Signed)
Eeg done 

## 2021-05-08 NOTE — ED Notes (Addendum)
Per MD. Stop Cardene drip temporarily and monitor BP as needed.

## 2021-05-09 ENCOUNTER — Inpatient Hospital Stay (HOSPITAL_COMMUNITY): Payer: Medicare Other

## 2021-05-09 DIAGNOSIS — R569 Unspecified convulsions: Secondary | ICD-10-CM | POA: Diagnosis not present

## 2021-05-09 LAB — URINALYSIS, ROUTINE W REFLEX MICROSCOPIC
Bilirubin Urine: NEGATIVE
Glucose, UA: NEGATIVE mg/dL
Ketones, ur: NEGATIVE mg/dL
Nitrite: NEGATIVE
Protein, ur: >300 mg/dL — AB
Specific Gravity, Urine: 1.02 (ref 1.005–1.030)
pH: 6.5 (ref 5.0–8.0)

## 2021-05-09 LAB — CBC
HCT: 31 % — ABNORMAL LOW (ref 36.0–46.0)
Hemoglobin: 10 g/dL — ABNORMAL LOW (ref 12.0–15.0)
MCH: 29.4 pg (ref 26.0–34.0)
MCHC: 32.3 g/dL (ref 30.0–36.0)
MCV: 91.2 fL (ref 80.0–100.0)
Platelets: 281 10*3/uL (ref 150–400)
RBC: 3.4 MIL/uL — ABNORMAL LOW (ref 3.87–5.11)
RDW: 16.3 % — ABNORMAL HIGH (ref 11.5–15.5)
WBC: 16.8 10*3/uL — ABNORMAL HIGH (ref 4.0–10.5)
nRBC: 0 % (ref 0.0–0.2)

## 2021-05-09 LAB — URINALYSIS, MICROSCOPIC (REFLEX): WBC, UA: >50 WBC/hpf (ref 0–5)

## 2021-05-09 LAB — GLUCOSE, CAPILLARY
Glucose-Capillary: 101 mg/dL — ABNORMAL HIGH (ref 70–99)
Glucose-Capillary: 103 mg/dL — ABNORMAL HIGH (ref 70–99)
Glucose-Capillary: 105 mg/dL — ABNORMAL HIGH (ref 70–99)
Glucose-Capillary: 113 mg/dL — ABNORMAL HIGH (ref 70–99)
Glucose-Capillary: 118 mg/dL — ABNORMAL HIGH (ref 70–99)
Glucose-Capillary: 130 mg/dL — ABNORMAL HIGH (ref 70–99)

## 2021-05-09 LAB — BASIC METABOLIC PANEL
Anion gap: 11 (ref 5–15)
BUN: 35 mg/dL — ABNORMAL HIGH (ref 8–23)
CO2: 21 mmol/L — ABNORMAL LOW (ref 22–32)
Calcium: 8.9 mg/dL (ref 8.9–10.3)
Chloride: 97 mmol/L — ABNORMAL LOW (ref 98–111)
Creatinine, Ser: 3.95 mg/dL — ABNORMAL HIGH (ref 0.44–1.00)
GFR, Estimated: 12 mL/min — ABNORMAL LOW (ref >60)
Glucose, Bld: 94 mg/dL (ref 70–99)
Potassium: 4 mmol/L (ref 3.5–5.1)
Sodium: 129 mmol/L — ABNORMAL LOW (ref 135–145)

## 2021-05-09 LAB — PROTIME-INR
INR: 4 — ABNORMAL HIGH (ref 0.8–1.2)
Prothrombin Time: 38.7 seconds — ABNORMAL HIGH (ref 11.4–15.2)

## 2021-05-09 LAB — MRSA NEXT GEN BY PCR, NASAL: MRSA by PCR Next Gen: NOT DETECTED

## 2021-05-09 LAB — MAGNESIUM: Magnesium: 1.8 mg/dL (ref 1.7–2.4)

## 2021-05-09 MED ORDER — ORAL CARE MOUTH RINSE
15.0000 mL | Freq: Two times a day (BID) | OROMUCOSAL | Status: DC
  Administered 2021-05-09 – 2021-05-17 (×15): 15 mL via OROMUCOSAL

## 2021-05-09 MED ORDER — SODIUM CHLORIDE 0.9 % IV SOLN
100.0000 mg | Freq: Two times a day (BID) | INTRAVENOUS | Status: AC
  Administered 2021-05-09 – 2021-05-13 (×8): 100 mg via INTRAVENOUS
  Filled 2021-05-09 (×9): qty 10

## 2021-05-09 MED ORDER — CHLORHEXIDINE GLUCONATE CLOTH 2 % EX PADS
6.0000 | MEDICATED_PAD | Freq: Every day | CUTANEOUS | Status: DC
  Administered 2021-05-09 – 2021-05-11 (×2): 6 via TOPICAL

## 2021-05-09 MED ORDER — HYDRALAZINE HCL 20 MG/ML IJ SOLN
10.0000 mg | Freq: Four times a day (QID) | INTRAMUSCULAR | Status: DC | PRN
  Administered 2021-05-09 – 2021-05-15 (×10): 10 mg via INTRAVENOUS
  Filled 2021-05-09 (×11): qty 1

## 2021-05-09 MED ORDER — SODIUM CHLORIDE 0.9 % IV SOLN
200.0000 mg | Freq: Once | INTRAVENOUS | Status: AC
  Administered 2021-05-09: 200 mg via INTRAVENOUS
  Filled 2021-05-09: qty 20

## 2021-05-09 MED ORDER — METOPROLOL TARTRATE 5 MG/5ML IV SOLN
5.0000 mg | Freq: Two times a day (BID) | INTRAVENOUS | Status: DC
  Administered 2021-05-09 – 2021-05-12 (×5): 5 mg via INTRAVENOUS
  Filled 2021-05-09 (×5): qty 5

## 2021-05-09 MED ORDER — CHLORHEXIDINE GLUCONATE 0.12 % MT SOLN
15.0000 mL | Freq: Two times a day (BID) | OROMUCOSAL | Status: DC
  Administered 2021-05-09 – 2021-05-18 (×14): 15 mL via OROMUCOSAL
  Filled 2021-05-09 (×13): qty 15

## 2021-05-09 MED ORDER — CHLORHEXIDINE GLUCONATE CLOTH 2 % EX PADS
6.0000 | MEDICATED_PAD | Freq: Every day | CUTANEOUS | Status: DC
  Administered 2021-05-09 – 2021-05-14 (×4): 6 via TOPICAL

## 2021-05-09 MED ORDER — LORAZEPAM 2 MG/ML IJ SOLN: 1.0000 mg | Freq: Four times a day (QID) | INTRAMUSCULAR | Status: DC | PRN

## 2021-05-09 NOTE — Progress Notes (Signed)
LTM maintain at bedside. No skin breakdown noted. Results pending.

## 2021-05-09 NOTE — Progress Notes (Signed)
Neurology progress note  S: Patient remains obtunded, not following commands despite no sedating medications.   Data:  LTM EEG 1/6-05/09/21  ABNORMALITY - Continuous slow, generalized - Breach artifact, left temporal region     IMPRESSION: This study is suggestive of cortical dysfunction arising from left temporal region consistent with underlying craniotomy. There is also severe diffuse encephalopathy, nonspecific etiology.   Event button was pressed on 05/08/2021 at 1939. During the event patient was laying in bed had left ar  jerking which then progressed to semi rhythmic jerking of bilateral upper extremities. Concomitant EEG didn't show any EEG changes suggest seizure. However focal motor seizure amy not be seen on scalp eeg. Therefore clinical correlation is recommended.  O:  Vitals:   05/09/21 1615 05/09/21 1630  BP: (!) 173/73 (!) 166/74  Pulse: 84 80  Resp: 20 15  Temp:    SpO2: 97% 97%   Gen: obtunded, briefly opens eyes to vigorous sternal rub but does not attend examiner, does not follow commands CV: RRR Resp: CTAB  Neurologic exam Gen: obtunded, briefly opens eyes to vigorous sternal rub but does not attend examiner, does not follow commands Speech: no intelligible speech CN: PERRL, (+) corneals, oculocephalics, gag, face symmetric at rest Motor: moves all extremities spontaneously but not to command Sensory: withdraws to noxious stimuli in all extremities Reflexes: 2+ L side, 3+ R side, toes mute bilat Coordination, gait: 77  71 yo F with seizure secondary to chronic left subdural hematoma who presents with unresponsiveness in setting of having previously weaned brivaracetam.  My suspicion is that this did represent breakthrough status epilepticus.  She improved following administration of ativan. She had an event overnight that was not associated with clear EEG change but semiology on video was felt to be c/w seizure. She missed her vimpat dose last night during  transfer so I have reloaded her on vimpat and continued her on 100mg  bid.  - Continue vimpat: s/p 200mg  reload this AM to be f/b 100mg  bid - Continue LTM EEG - Seizure precautions - Will continue to follow  Su Monks, MD [facility] [phone]  If 7pm- 7am, please page neurology on call as listed in [place].

## 2021-05-09 NOTE — Progress Notes (Signed)
°  Transition of Care [facility]) Screening Note   Patient Details  Name: CHARLAINE UTSEY Date of Birth: 1951-02-03   Transition of Care [facility]) CM/SW Contact:    Alfredia Ferguson, LCSW Phone Number: 05/09/2021, 9:01 AM    Transition of Care Department [facility]) has reviewed patient and no TOC needs have been identified at this time. We will continue to monitor patient advancement through interdisciplinary progression rounds. If new patient transition needs arise, please place a TOC consult.

## 2021-05-09 NOTE — Progress Notes (Signed)
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  Maintain contact precautions, if vesicles open or begin draining, initiate airborne precautions.

## 2021-05-09 NOTE — Progress Notes (Signed)
Neurology Progress Note  Brief HPI: Patient was consulted by neurology yesterday at [facility] for unresponsiveness. CTH/CTA only showed her chronic SDH. Given concern for status epilepticus, patient received Ativan, Keppra, and her Vimpat dose was increased (took Vimpat 50mg  po bid at [facility]). Given her positive reaction to Ativan, it was thought that her presentation was likely status epilepticus. She was transferred to [facility] for further management of her seizures.   Her EEG overnight was consistent with known SDH/prior craniotomy (cortical dysfunction left temporal region). Other changes consistent with severe diffuse encephalopathy. T  Event button was pressed on 05/08/2021 at 1939. During the event patient was laying in bed had left arm jerking which then progressed to semi rhythmic jerking of bilateral upper extremities. Concomitant EEG didn't show any EEG changes suggest seizure. However focal motor seizure amy not be seen on scalp eeg. Therefore clinical correlation is recommended per Dr. Karolee Stamps read.  S: Can not evaluate ROS due to patient's mental status. Per RN, patient will open her eyes briefly, then close. Is following no commands. Withdraws to pain in all 4 extremities. Right gaze preference. No noted seizure activity this am. Cardene gtt is currently off and patient's BP < 180. Was hypothermic and after Bair Hugger placed, is now normothermic.   O: Current vital signs: BP (!) 165/81    Pulse 95    Temp 98.1 F (36.7 C) (Oral)    Resp (!) 21    SpO2 92%  Vital signs in last 24 hours: Temp:  [95.4 F (35.2 C)-98.1 F (36.7 C)] 98.1 F (36.7 C) (01/07 0400) Pulse Rate:  [53-97] 95 (01/07 0745) Resp:  [11-26] 21 (01/07 0745) BP: (100-225)/(55-124) 165/81 (01/07 0745) SpO2:  [88 %-99 %] 92 % (01/07 0745) Weight:  [52.9 kg] 52.9 kg (01/06 0859)  GENERAL: Acutely ill appearing female lying in ICU bed. NAD.  HEENT: Normocephalic and atraumatic. LUNGS: Normal respiratory effort.  CV: RRR on  tele.  Ext: warm.  NEURO:  Mental Status: She opens her eyes slightly to loud voice and sternal rub, but then closes. Further into the exam, she seems to open her eyes on command and look at examiner. She does not track NP. Speech/Language: No intelligible sounds. Unable to answer questions, name, or repeat. Comprehension is ? Intact with her following simple command x 2 today, but slowed, and with coaching.   Cranial Nerves:  II: PERRL.  III, IV, VI: Eyelids elevate symmetrically. Eyes are conjugate and gaze to right. No crossing of midline on oculo reflex. No nystagmus noted.   V, VII: Grimaces to brow pressure. + corneal reflexes. Face is symmetrical at rest.  VIII: hearing intact to loud voice, clapping. IX, X: + gag reflex. Head is grossly midline.  XII: Does not open mouth on command.  Motor:  She attempts to squeeze on command with her right hand, but then tremors which could just be a reflex. No attempt to squeeze with left hand and no tremors noted. Withdraws to pain with all 4 extremities. Moved both feet on command. Babinski is mute. Tone: is normal and bulk is normal.  Medications  Current Facility-Administered Medications:    acetaminophen (TYLENOL) suppository 650 mg, 650 mg, Rectal, Q6H PRN, Mick Sell, PA-C   Chlorhexidine Gluconate Cloth 2 % PADS 6 each, 6 each, Topical, Daily, Mannam, Praveen, MD   docusate sodium (COLACE) capsule 100 mg, 100 mg, Oral, BID PRN, Andres Labrum D, PA-C   nicardipine (CARDENE) 20mg  in 0.86% saline 259ml IV infusion (  0.1 mg/ml), 3-15 mg/hr, Intravenous, Continuous, Mick Sell, PA-C, Last Rate: 10 mL/hr at 05/09/21 0700, 1 mg/hr at 05/09/21 0700   pantoprazole (PROTONIX) injection 40 mg, 40 mg, Intravenous, QHS, Mick Sell, PA-C, 40 mg at 05/08/21 2131   polyethylene glycol (MIRALAX / GLYCOLAX) packet 17 g, 17 g, Oral, Daily PRN, Mick Sell, PA-C  Pertinent Labs Na 129.    ammonia 30.   Ethanol < 10.   No new imaging since consult.    EEG-Overnight 1/6-05/09/20.  This study is suggestive of cortical dysfunction arising from left temporal region consistent with underlying craniotomy. There is also severe diffuse encephalopathy, nonspecific etiology.   This study is suggestive of cortical dysfunction arising from left temporal region consistent with underlying craniotomy. There is also severe diffuse encephalopathy, nonspecific etiology.   Event button was pressed on 05/08/2021 at 1939. During the event patient was laying in bed had left ar  jerking which then progressed to semi rhythmic jerking of bilateral upper extremities. Concomitant EEG didn't show any EEG changes suggest seizure. However focal motor seizure amy not be seen on scalp eeg. Therefore clinical correlation is recommended.  Assessment: Patient is a 71 yo female with a hx of chronic SDH s/p craniotomy and seizure disorder who presented from SNF with unresponsiveness. Imaging neg for stroke and she was thought to be in status epilepticus. She was transferred to [facility]. Her overnight EEG with event button activity of left arm jerking progressing to semi rhythmic jerking of BUEs. No concomitant seizure on EEG, but focal seizures remain a concern.   Recommendations/Plan:  -seizure precautions.  -Continue cEEG and f/up later read.  -Continue increased dose of Vimpat at 100mg  po bid.  -Notify neurology for any witnessed seizure like activity.   Pt seen by Clance Boll, MSN, APN-BC/Nurse Practitioner/Neuro and later by MD. Note and plan to be edited as needed by MD.  Pager: [phone]

## 2021-05-09 NOTE — Evaluation (Signed)
Physical Therapy Evaluation Patient Details Name: Amy [name] MRN: [id number] DOB: 1950-08-13 Today's Date: 05/09/2021  History of Present Illness  71 y/o female presented to [facility] ED on 05/08/21 for AMS. Seizure likey activity noted in ED. Transferred to [facility]. ST EEG negative. PMH: ESRD on HD (MWF), anxiety, HFrEF (EF 25%), severe diffuse CAD s/p DES proximal LAD (06/27/2020), R hip fx, acute on chronic SDH s/p partial craniotomy and evacuation of SDH (in March 2022), seizures, depression, and HTN  Clinical Impression  Patient admitted with above diagnosis. At baseline, patient requires +2 assist for w/c transfer or use of lift and patient able to carry on "normal conversation" but with STM deficits. Patient lethargic with eyes closed and not following commands. Patient not on any sedating meds per RN. Family is hopeful for progression to return to SNF. Patient will benefit from skilled PT services during acute stay to address listed deficits. Recommend SNF at this time but will continue to assess if arousal improves. Recommend B prevalon boots to reduce pressure on heels, notified RN.      Recommendations for follow up therapy are one component of a multi-disciplinary discharge planning process, led by the attending physician.  Recommendations may be updated based on patient status, additional functional criteria and insurance authorization.  Follow Up Recommendations Skilled nursing-short term rehab (<3 hours/day)    Assistance Recommended at Discharge Frequent or constant Supervision/Assistance  Patient can return home with the following       Equipment Recommendations None recommended by PT  Recommendations for Other Services       Functional Status Assessment Patient has had a recent decline in their functional status and/or demonstrates limited ability to make significant improvements in function in a reasonable and predictable amount of time     Precautions / Restrictions  Precautions Precautions: Other (comment) Precaution Comments: fragile skin; at risk for skin breakdown      Mobility  Bed Mobility Overal bed mobility: Needs Assistance             General bed mobility comments: total A    Transfers                   General transfer comment: NA    Ambulation/Gait                  Stairs            Wheelchair Mobility    Modified Rankin (Stroke Patients Only)       Balance                                             Pertinent Vitals/Pain Pain Assessment: Faces Faces Pain Scale: No hurt    Home Living Family/patient expects to be discharged to:: Skilled nursing facility                        Prior Function Prior Level of Function : Needs assist       Physical Assist : ADLs (physical);Mobility (physical) Mobility (physical): Bed mobility;Transfers ADLs (physical): Bathing;Dressing;Toileting Mobility Comments: Staff used +2 assist or lift to mobilize to chair; not currently walking ADLs Comments: pt assisted with UB bathing adn was ablet o feed herself     Hand Dominance   Dominant Hand: Right    Extremity/Trunk Assessment   Upper Extremity Assessment  Upper Extremity Assessment: Defer to OT evaluation    Lower Extremity Assessment Lower Extremity Assessment: Generalized weakness (PROM WFL; at risk for contracture development)       Communication   Communication:  (nonvierbal this date; normally no difficulty with communication)  Cognition Arousal/Alertness: Lethargic Behavior During Therapy: Flat affect Overall Cognitive Status: Difficult to assess                                          General Comments General comments (skin integrity, edema, etc.): Asked nsg to order B Prevalon boots    Exercises General Exercises - Upper Extremity Shoulder Flexion: Both;5 reps;PROM;Supine Shoulder ABduction: PROM;Both;5 reps Elbow Flexion:  PROM;Both;5 reps Elbow Extension: PROM;Both;5 reps Wrist Flexion: PROM;Both;5 reps Wrist Extension: PROM;Both;5 reps Digit Composite Flexion: PROM;Both;5 reps Composite Extension: PROM;Both;5 reps Other Exercises Other Exercises: PROM bilateral hips, knees, and ankles   Assessment/Plan    PT Assessment Patient needs continued PT services  PT Problem List Decreased strength;Decreased range of motion;Decreased activity tolerance;Decreased balance;Decreased mobility;Decreased coordination;Decreased cognition;Decreased knowledge of use of DME;Decreased safety awareness;Decreased knowledge of precautions       PT Treatment Interventions Functional mobility training;Therapeutic activities;Therapeutic exercise;Balance training;DME instruction;Neuromuscular re-education;Patient/family education;Wheelchair mobility training    PT Goals (Current goals can be found in the Care Plan section)  Acute Rehab PT Goals Patient Stated Goal: family wants patient to return to SNF PT Goal Formulation: With family Time For Goal Achievement: 05/23/21 Potential to Achieve Goals: Poor    Frequency Min 1X/week (increase once level of arousal improves)     Co-evaluation PT/OT/SLP Co-Evaluation/Treatment: Yes Reason for Co-Treatment: Complexity of the patient's impairments (multi-system involvement) PT goals addressed during session: Strengthening/ROM OT goals addressed during session: ADL's and self-care       AM-PAC PT "6 Clicks" Mobility  Outcome Measure Help needed turning from your back to your side while in a flat bed without using bedrails?: Total Help needed moving from lying on your back to sitting on the side of a flat bed without using bedrails?: Total Help needed moving to and from a bed to a chair (including a wheelchair)?: Total Help needed standing up from a chair using your arms (e.g., wheelchair or bedside chair)?: Total Help needed to walk in hospital room?: Total Help needed  climbing 3-5 steps with a railing? : Total 6 Click Score: 6    End of Session   Activity Tolerance: Patient limited by lethargy Patient left: in bed;with call bell/phone within reach;with family/visitor present Nurse Communication: Mobility status PT Visit Diagnosis: Muscle weakness (generalized) (M62.81)    Time: 8315-1761 PT Time Calculation (min) (ACUTE ONLY): 16 min   Charges:   PT Evaluation $PT Eval Moderate Complexity: 1 Mod          [name] Earwood A. Gilford Rile PT, DPT Acute Rehabilitation Services Pager [phone] Office [phone]   Linna Hoff 05/09/2021, 3:45 PM

## 2021-05-09 NOTE — Evaluation (Signed)
Occupational Therapy Evaluation Patient Details Name: Amy [name] MRN: [id number] DOB: Oct 04, 1950 Today's Date: 05/09/2021   History of Present Illness 71 y/o female presented to [facility] ED on 05/08/21 for AMS. Seizure likey activity noted in ED. Transferred to [facility]. ST EEG negative. PMH: ESRD on HD (MWF), anxiety, HFrEF (EF 25%), severe diffuse CAD s/p DES proximal LAD (06/27/2020), R hip fx, acute on chronic SDH s/p partial craniotomy and evacuation of SDH (in March 2022), seizures, depression, and HTN   Clinical Impression   PTA pt living at [facility] and receiving PT/OT.  At baseline, she requires +2 assist to transfer to her w/c or use of a lift; pt is able to assist with bathing and feeds herself. Family states she has STM loss at baseline, however can carry on a "normal conversation". Pt on continuous EEG during assessment. Lethargic, eyes closed, not following any commands. Nsg reports pt is not on any sedating meds. Will follow up later in the week and follow acutely if level of arousal improves. Recommend B Prevalon boots to reduce pressure on B heels - nsg aware. Need for follow up OT at SNF if level of arousal improves. Family's goal is for pt to return to [facility]LOF at [facility].      Recommendations for follow up therapy are one component of a multi-disciplinary discharge planning process, led by the attending physician.  Recommendations may be updated based on patient status, additional functional criteria and insurance authorization.   Follow Up Recommendations  Skilled nursing-short term rehab (<3 hours/day)    Assistance Recommended at Discharge Frequent or constant Supervision/Assistance  Patient can return home with the following      Functional Status Assessment  Patient has had a recent decline in their functional status and/or demonstrates limited ability to make significant improvements in function in a reasonable and predictable amount of time  Equipment Recommendations   None recommended by OT    Recommendations for Other Services Other (comment) (Palliative Medicine)     Precautions / Restrictions Precautions Precautions: Other (comment) Precaution Comments: fragile skin; at risk for skin breakdown      Mobility Bed Mobility Overal bed mobility: Needs Assistance             General bed mobility comments: total A    Transfers                   General transfer comment: NA      Balance                                           ADL either performed or assessed with clinical judgement   ADL Overall ADL's : Needs assistance/impaired                                       General ADL Comments: total A     Vision         Perception     Praxis      Pertinent Vitals/Pain Pain Assessment: Faces Faces Pain Scale: No hurt     Hand Dominance Right   Extremity/Trunk Assessment Upper Extremity Assessment Upper Extremity Assessment: Generalized weakness (BUE PROM WFL; No active movement)   Lower Extremity Assessment Lower Extremity Assessment: Defer to PT evaluation (At risk for  contracture development)       Communication Communication Communication:  (nonvierbal this date; normally no difficulty with communication)   Cognition Arousal/Alertness: Lethargic Behavior During Therapy: Flat affect Overall Cognitive Status: Difficult to assess                                       General Comments  Asked nsg to order B Prevalon boots    Exercises Exercises: Other exercises;General Upper Extremity General Exercises - Upper Extremity Shoulder Flexion: Both;5 reps;PROM;Supine Shoulder ABduction: PROM;Both;5 reps Elbow Flexion: PROM;Both;5 reps Elbow Extension: PROM;Both;5 reps Wrist Flexion: PROM;Both;5 reps Wrist Extension: PROM;Both;5 reps Digit Composite Flexion: PROM;Both;5 reps Composite Extension: PROM;Both;5 reps   Shoulder Instructions      Home Living  Family/patient expects to be discharged to:: Skilled nursing facility                                        Prior Functioning/Environment Prior Level of Function : Needs assist       Physical Assist : ADLs (physical);Mobility (physical) Mobility (physical): Bed mobility;Transfers ADLs (physical): Bathing;Dressing;Toileting Mobility Comments: Staff used +2 assist or lift to mobilize to chair; was not walking ADLs Comments: pt assisted with UB bathing adn was ablet o feed herself        OT Problem List: Decreased strength;Decreased range of motion;Decreased activity tolerance;Impaired balance (sitting and/or standing);Decreased cognition;Decreased safety awareness;Decreased knowledge of use of DME or AE;Cardiopulmonary status limiting activity;Impaired sensation;Impaired UE functional use;Increased edema      OT Treatment/Interventions: Self-care/ADL training;Therapeutic exercise;Neuromuscular education;DME and/or AE instruction;Therapeutic activities;Cognitive remediation/compensation;Patient/family education;Balance training    OT Goals(Current goals can be found in the care plan section) Acute Rehab OT Goals Patient Stated Goal: per family to return to [facility] OT Goal Formulation: With family Time For Goal Achievement: 05/23/21 Potential to Achieve Goals: Fair  OT Frequency: Min 1X/week    Co-evaluation PT/OT/SLP Co-Evaluation/Treatment: Yes Reason for Co-Treatment: Complexity of the patient's impairments (multi-system involvement)   OT goals addressed during session: ADL's and self-care      AM-PAC OT "6 Clicks" Daily Activity     Outcome Measure Help from another person eating meals?: Total Help from another person taking care of personal grooming?: Total Help from another person toileting, which includes using toliet, bedpan, or urinal?: Total Help from another person bathing (including washing, rinsing, drying)?: Total Help from another person to  put on and taking off regular upper body clothing?: Total Help from another person to put on and taking off regular lower body clothing?: Total 6 Click Score: 6   End of Session Nurse Communication: Other (comment) (need for B Prevalon boots)  Activity Tolerance: Patient limited by lethargy Patient left: in bed;with call bell/phone within reach;with family/visitor present  OT Visit Diagnosis: Other abnormalities of gait and mobility (R26.89);Muscle weakness (generalized) (M62.81);Other symptoms and signs involving cognitive function                Time: 4287-6811 OT Time Calculation (min): 17 min Charges:  OT General Charges $OT Visit: 1 Visit OT Evaluation $OT Eval Moderate Complexity: [place], OT/L   Acute OT Clinical Specialist Acute Rehabilitation Services Pager [phone] Office [phone]   [facility] 05/09/2021, 1:51 PM

## 2021-05-09 NOTE — Procedures (Addendum)
Patient Name: Amy [name]  MRN: [id number]  Epilepsy Attending: [name]  Referring Physician/Provider: Dr Su Monks Duration: 05/08/2021 1651 to 05/09/2021 1651  Patient history: 71 year old female presenting with unresponsiveness that aborted with Ativan. EEG to evaluate for seizure  Level of alertness:  lethargic   AEDs during EEG study: LCM  Technical aspects: This EEG study was done with scalp electrodes positioned according to the 10-20 International system of electrode placement. Electrical activity was acquired at a sampling rate of 500Hz  and reviewed with a high frequency filter of 70Hz  and a low frequency filter of 1Hz . EEG data were recorded continuously and digitally stored.   Description: EEG showed continuous generalized polymorphic disorganized mixed frequencies with predominantly 5 to 9 Hz theta-alpha activity admixed 2-3hz  delta slowing. EEG also showed intermittent 2-3 seconds of generalized eeg attenuation. There was also intermittent 15-18Hz  frontocentral beta activity. Sharp transitn s were noted in left temporal region consistent with breach artifact.  Event button was pressed on 05/08/2021 at 1939. During the event patient was laying in bed had left ar  jerking which then progressed to semi rhythmic jerking of bilateral upper extremities. Concomitant EEG before, during and after the event did not show any EEG changes suggest seizure.  Hyperventilation and photic stimulation were not performed.     ABNORMALITY - Continuous slow, generalized - Breach artifact, left temporal region   IMPRESSION: This study is suggestive of cortical dysfunction arising from left temporal region consistent with underlying craniotomy. There is also severe diffuse encephalopathy, nonspecific etiology.  Event button was pressed on 05/08/2021 at 1939. During the event patient was laying in bed had left ar  jerking which then progressed to semi rhythmic jerking of bilateral upper  extremities. Concomitant EEG didn't show any EEG changes suggest seizure. However focal motor seizure amy not be seen on scalp eeg. Therefore clinical correlation is recommended.  Niyla Marone Barbra Sarks

## 2021-05-09 NOTE — Progress Notes (Signed)
Dialysis consent obtained from husband at bedside, and placed in shadow chart.

## 2021-05-09 NOTE — Consult Note (Addendum)
[facility] Renal Consultation Note    Indication for Consultation:  Management of ESRD/hemodialysis; anemia, hypertension/volume and secondary hyperparathyroidism  ZOX:WRUEA, Harmeet, MD  HPI: Amy [name] is a 71 y.o. female. ESRD n HD MWF at [facility].  Past medical history significant for HTN, ischemic cardiomyopathy, CAD w/prior DES, GERN, anxiety, Hx subdural hematoma s/p evacuation in 07/2020 with subsequent seizure disorder.     Patient seen and examined at bedside in neuro ICU without family present. History obtained through chart review as patient is non responsive. Presented to [facility] yesterday from SNF due to altered mental status and aphasia.  Code stroke called.  Per Neurology notes CT and CT angio obtained only demonstrated chronic subdural hematoma.  No thrombolytics given due to CT results.  Noted seizure like activity with response to ativan suggesting status epilepticus.  EEG with no changes suggesting seizure. Initial improvement in mental status which later declined again.  Patient transferred to [facility] ICU at [facility].   Other pertinent findings in work up include hypertension SBP>180, Na 129, K 4.0, BUN 35, SCr 3.95, WBC 16.8, negative respiratory panel, CXR with b/l pleural effusions, L>R, CT chest with large left and moderate right pleural effusions with near complete collapse of the left lower lobe and mild pulmonary interstitial edema.  Patient admitted for further evaluation and management.   Past Medical History:  Diagnosis Date   Anemia    a. 08/2016 Guaiac + stool. EGD w/ gastritis.   Anxiety    Closed fracture of shaft of left ulna 11/27/2018   Colon polyp    a. 08/2016 Colonoscopy.   ESRD on peritoneal dialysis ([place])    Gastritis    a. 08/2016 EGD: Gastritis-->PPI.   GERD (gastroesophageal reflux disease)    History of chicken pox    History of measles as a child    Hypertension    Hyponatremia    a. 08/2016 in setting of HCTZ Rx.   Leukocytosis     Mesenteric lymphadenopathy 04/08/2017   Multiple thyroid nodules    Past Surgical History:  Procedure Laterality Date   ABDOMINAL HYSTERECTOMY  2003   due to heavy periods and clotting during menses   BREAST LUMPECTOMY Left 1990   CHOLECYSTECTOMY  2010   COLONOSCOPY WITH PROPOFOL N/A 09/07/2016   Procedure: COLONOSCOPY WITH PROPOFOL;  Surgeon: Lucilla Lame, MD;  Location: [facility] ENDOSCOPY;  Service: Endoscopy;  Laterality: N/A;   CORONARY STENT INTERVENTION N/A 06/27/2020   Procedure: CORONARY STENT INTERVENTION;  Surgeon: Sherren Mocha, MD;  Location: [place] CV LAB;  Service: Cardiovascular;  Laterality: N/A;   DIALYSIS/PERMA CATHETER INSERTION N/A 11/16/2018   Procedure: DIALYSIS/PERMA CATHETER INSERTION;  Surgeon: Algernon Huxley, MD;  Location: [place] CV LAB;  Service: Cardiovascular;  Laterality: N/A;   DIALYSIS/PERMA CATHETER INSERTION N/A 11/10/2020   Procedure: DIALYSIS/PERMA CATHETER EXCHANGE;  Surgeon: Algernon Huxley, MD;  Location: [place] CV LAB;  Service: Cardiovascular;  Laterality: N/A;   DIALYSIS/PERMA CATHETER INSERTION N/A 12/24/2020   Procedure: DIALYSIS/PERMA CATHETER INSERTION / EXCHANGE;  Surgeon: Algernon Huxley, MD;  Location: [place] CV LAB;  Service: Cardiovascular;  Laterality: N/A;   DIALYSIS/PERMA CATHETER INSERTION N/A 01/19/2021   Procedure: DIALYSIS/PERMA CATHETER INSERTION;  Surgeon: Algernon Huxley, MD;  Location: [place] CV LAB;  Service: Cardiovascular;  Laterality: N/A;   DIALYSIS/PERMA CATHETER REMOVAL N/A 08/16/2019   Procedure: DIALYSIS/PERMA CATHETER REMOVAL;  Surgeon: Algernon Huxley, MD;  Location: [place] CV LAB;  Service: Cardiovascular;  Laterality:  N/A;   DILATION AND CURETTAGE OF UTERUS  1985   ESOPHAGOGASTRODUODENOSCOPY (EGD) WITH PROPOFOL N/A 09/07/2016   Procedure: ESOPHAGOGASTRODUODENOSCOPY (EGD) WITH PROPOFOL;  Surgeon: Lucilla Lame, MD;  Location: [facility] ENDOSCOPY;  Service: Endoscopy;  Laterality: N/A;   GALLBLADDER SURGERY   2012   INTRAMEDULLARY (IM) NAIL INTERTROCHANTERIC Right 04/23/2020   Procedure: INTRAMEDULLARY (IM) NAIL INTERTROCHANTRIC;  Surgeon: Thornton Park, MD;  Location: [facility] ORS;  Service: Orthopedics;  Laterality: Right;   INTRAVASCULAR IMAGING/OCT N/A 06/27/2020   Procedure: INTRAVASCULAR IMAGING/OCT;  Surgeon: Sherren Mocha, MD;  Location: [place] CV LAB;  Service: Cardiovascular;  Laterality: N/A;   IR FLUORO GUIDE CV LINE RIGHT  09/04/2020   IR US GUIDE VASC ACCESS RIGHT  09/04/2020   KNEE SURGERY     Dr. Tamala Julian   LEFT HEART CATH AND CORONARY ANGIOGRAPHY N/A 06/25/2020   Procedure: LEFT HEART CATH AND CORONARY ANGIOGRAPHY;  Surgeon: Troy Sine, MD;  Location: [place] CV LAB;  Service: Cardiovascular;  Laterality: N/A;   TONSILLECTOMY     TONSILLECTOMY AND ADENOIDECTOMY  1970   UPPER GI ENDOSCOPY  03/02/2006   H. Pylori negative; Dr. Allen Norris   Family History  Problem Relation Age of Onset   Pancreatic cancer Father    Congestive Heart Failure Maternal Grandmother    Cancer Paternal Grandfather    Heart disease Maternal Uncle    Social History:  reports that she has never smoked. She has never used smokeless tobacco. She reports current alcohol use. She reports that she does not use drugs. Allergies  Allergen Reactions   Cephalosporins Hives and Swelling   Clarithromycin Other (See Comments)    Unknown reaction   Ensure [Nutritional Supplements] Nausea And Vomiting   Gabapentin    Lunesta  [Eszopiclone]     Heart racing   Nepro [Compleat] Nausea And Vomiting   Penicillin V     Has patient had a PCN reaction causing immediate rash, facial/tongue/throat swelling, SOB or lightheadedness with hypotension: Yes Has patient had a PCN reaction causing severe rash involving mucus membranes or skin necrosis: No Has patient had a PCN reaction that required hospitalization No Has patient had a PCN reaction occurring within the last 10 years: No If all of the above answers are "NO",  then may proceed with Cephalosporin use.   Sulfa Antibiotics     Unknown reaction   Prior to Admission medications   Medication Sig Start Date End Date Taking? Authorizing Provider  acetaminophen (TYLENOL) 325 MG tablet Take 1-2 tablets (325-650 mg total) by mouth every 4 (four) hours as needed for mild pain. Patient taking differently: Take 325-650 mg by mouth as needed for mild pain, fever or headache. 09/12/20   Love, Ivan Anchors, PA-C  atorvastatin (LIPITOR) 40 MG tablet Take 1 tablet (40 mg total) by mouth daily. 09/13/20   Love, Ivan Anchors, PA-C  bismuth subsalicylate (KAOPECTATE) 262 MG/15ML suspension Take 30 mLs by mouth as needed for diarrhea or loose stools.    [provider]  brivaracetam (BRIVIACT) 25 MG TABS tablet Take 3 tablets (75 mg total) by mouth 2 (two) times daily. Patient not taking: Reported on 05/08/2021 09/12/20   Bary Leriche, PA-C  calcium carbonate (TUMS EX) 750 MG chewable tablet Chew 1-2 tablets by mouth as needed for heartburn.    [provider]  camphor-menthol Timoteo Ace) lotion Apply topically as needed for itching. 09/12/20   Love, Ivan Anchors, PA-C  Chlorhexidine Gluconate Cloth 2 % PADS Apply 6 each topically daily.  Patient not taking: Reported on 05/08/2021 09/13/20   Love, Ivan Anchors, PA-C  Darbepoetin Alfa (ARANESP) 60 MCG/0.3ML SOSY injection Inject 0.3 mLs (60 mcg total) into the vein every Saturday with hemodialysis. 09/20/20   Love, Ivan Anchors, PA-C  diphenhydrAMINE (BENADRYL) 25 mg capsule Take 25-50 mg by mouth every 6 (six) hours as needed for itching or allergies.    [provider]  docusate sodium (COLACE) 100 MG capsule Take 1 capsule (100 mg total) by mouth 2 (two) times daily. 09/12/20   Love, Ivan Anchors, PA-C  ezetimibe (ZETIA) 10 MG tablet Take 1 tablet (10 mg total) by mouth daily. 02/24/21 05/25/21  Minna Merritts, MD  hydrALAZINE (APRESOLINE) 25 MG tablet Take 1 tablet (25 mg total) by mouth 3 (three) times daily. May take an extra 25  mg for SBP >160 02/24/21 05/25/21  Minna Merritts, MD  hydrOXYzine (ATARAX/VISTARIL) 10 MG tablet Take 10 mg by mouth 3 (three) times daily. 01/07/21   [provider]  lacosamide (VIMPAT) 50 MG TABS tablet Take 1 tablet (50 mg total) by mouth 2 (two) times daily. 09/12/20   Love, Ivan Anchors, PA-C  loperamide (IMODIUM) 2 MG capsule Take 2 mg by mouth as needed for diarrhea or loose stools. May repeat after each loose stool X 2.    [provider]  losartan (COZAAR) 50 MG tablet Take 50 mg by mouth daily. 04/21/21   [provider]  melatonin 5 MG TABS Take 1 tablet (5 mg total) by mouth at bedtime. 09/12/20   Love, Ivan Anchors, PA-C  methocarbamol (ROBAXIN) 500 MG tablet Take 1 tablet (500 mg total) by mouth every 6 (six) hours as needed for muscle spasms. 09/12/20   Love, Ivan Anchors, PA-C  metoprolol tartrate (LOPRESSOR) 25 MG tablet Take 12.5 mg by mouth 2 (two) times daily. 12/12/20   [provider]  multivitamin (RENA-VIT) TABS tablet Take 1 tablet by mouth at bedtime. 09/12/20   Love, Ivan Anchors, PA-C  nitroGLYCERIN (NITROSTAT) 0.4 MG SL tablet Place 0.4 mg under the tongue every 5 (five) minutes as needed for chest pain. X 2 more doses.    [provider]  ondansetron (ZOFRAN) 4 MG tablet Take 4 mg by mouth every 8 (eight) hours as needed. 12/22/20   [provider]  pantoprazole (PROTONIX) 40 MG tablet TAKE 1 TABLET (40 MG TOTAL) BY MOUTH DAILY. 06/30/20 06/30/21  Geradine Girt, DO  sertraline (ZOLOFT) 50 MG tablet Take 50 mg by mouth daily. 12/15/20   [provider]  traZODone (DESYREL) 100 MG tablet Take 100 mg by mouth at bedtime. 04/22/21   [provider]  traZODone (DESYREL) 50 MG tablet Take 0.5-1 tablets (25-50 mg total) by mouth at bedtime as needed for sleep. Patient not taking: Reported on 05/08/2021 09/12/20   Love, Ivan Anchors, PA-C  triamcinolone cream (KENALOG) 0.1 % Apply topically 3 (three) times daily. 09/12/20   Love, Ivan Anchors, PA-C  valACYclovir (VALTREX) 1000 MG tablet Take 1,000 mg by mouth daily. 05/06/21   [provider]  warfarin (COUMADIN) 1 MG tablet Take 1 mg by mouth daily.    [provider]   Current Facility-Administered Medications  Medication Dose Route Frequency Provider Last Rate Last Admin   acetaminophen (TYLENOL) suppository 650 mg  650 mg Rectal Q6H PRN Mick Sell, PA-C       chlorhexidine (PERIDEX) 0.12 % solution 15 mL  15 mL Mouth Rinse BID Marshell Garfinkel, MD  Chlorhexidine Gluconate Cloth 2 % PADS 6 each  6 each Topical Daily Mannam, Praveen, MD       docusate sodium (COLACE) capsule 100 mg  100 mg Oral BID PRN Mick Sell, PA-C       lacosamide (VIMPAT) 200 mg in sodium chloride 0.9 % 25 mL IVPB  200 mg Intravenous Once Derek Jack, MD       Followed by   lacosamide (VIMPAT) 100 mg in sodium chloride 0.9 % 25 mL IVPB  100 mg Intravenous Q12H Derek Jack, MD       MEDLINE mouth rinse  15 mL Mouth Rinse q12n4p Mannam, Praveen, MD       nicardipine (CARDENE) 20mg  in 0.86% saline 259ml IV infusion (0.1 mg/ml)  3-15 mg/hr Intravenous Continuous Andres Labrum D, PA-C   Stopped at 05/09/21 0721   pantoprazole (PROTONIX) injection 40 mg  40 mg Intravenous QHS Andres Labrum D, PA-C   40 mg at 05/08/21 2131   polyethylene glycol (MIRALAX / GLYCOLAX) packet 17 g  17 g Oral Daily PRN Mick Sell, PA-C       Labs: Basic Metabolic Panel: Recent Labs  Lab 05/08/21 0919 05/09/21 0447  NA 129* 129*  K 3.5 4.0  CL 97* 97*  CO2 26 21*  GLUCOSE 102* 94  BUN 32* 35*  CREATININE 3.39* 3.95*  CALCIUM 8.7* 8.9   Liver Function Tests: Recent Labs  Lab 05/08/21 0919  AST 17  ALT 11  ALKPHOS 108  BILITOT 0.7  PROT 5.3*  ALBUMIN 2.5*   Recent Labs  Lab 05/08/21 1638  AMMONIA 30   CBC: Recent Labs  Lab 05/08/21 0919 05/09/21 0557  WBC 11.3* 16.8*  NEUTROABS 8.9*  --   HGB 10.9* 10.0*  HCT 33.0* 31.0*  MCV 91.7 91.2  PLT 241 281   CBG: Recent  Labs  Lab 05/08/21 0859 05/08/21 1948 05/08/21 2327 05/09/21 0342 05/09/21 0819  GLUCAP 97 120* 120* 103* 118*   Iron Studies: No results for input(s): IRON, TIBC, TRANSFERRIN, FERRITIN in the last 72 hours. Studies/Results: CT CHEST W CONTRAST  Result Date: 05/08/2021 CLINICAL DATA:  Altered mental status.  Pleural effusion. EXAM: CT CHEST WITH CONTRAST TECHNIQUE: Multidetector CT imaging of the chest was performed during intravenous contrast administration. CONTRAST:  42mL OMNIPAQUE IOHEXOL 350 MG/ML SOLN COMPARISON:  Chest x-ray dated December 22, 2020. CTA chest dated June 23, 2020. FINDINGS: Cardiovascular: Unchanged borderline cardiomegaly. No pericardial effusion. No thoracic aortic aneurysm or dissection. No central pulmonary embolism. Coronary, aortic arch, and branch vessel atherosclerotic vascular disease. New tunneled right internal jugular dialysis catheter with tip at the cavoatrial junction. Mediastinum/Nodes: No enlarged mediastinal, hilar, or axillary lymph nodes. Unchanged 1.5 cm hypodense thyroid nodules in the right thyroid lobe and isthmus. Trachea and esophagus demonstrate no significant findings. Lungs/Pleura: Large left and moderate right pleural effusions. Near complete collapse of the left lower lobe. Dependent subsegmental atelectasis in the posterior right lower lobe. Upper lobe predominant smooth interlobular septal thickening. No consolidation or pneumothorax. Central airways are patent. Upper Abdomen: No acute abnormality. Unchanged nodular liver contour with multiple small hepatic cysts. Unchanged bilateral adrenal gland thickening and left upper pole renal calculi. Unchanged small hiatal hernia. Musculoskeletal: Diffuse anasarca. No acute or significant osseous findings. IMPRESSION: 1. Large left and moderate right pleural effusions with near complete collapse of the left lower lobe. 2. Mild pulmonary interstitial edema. 3. Multiple thyroid nodules which have slowly  increased in size since 2018. Recommend  thyroid US (ref: J Am Coll Radiol. 2015 Feb;12(2): 143-50). 4. Aortic Atherosclerosis (ICD10-I70.0). Electronically Signed   By: Titus Dubin M.D.   On: 05/08/2021 10:08   DG Chest Port 1 View  Result Date: 05/09/2021 CLINICAL DATA:  71 year old female with shortness of breath. EXAM: PORTABLE CHEST 1 VIEW COMPARISON:  Chest CT 05/08/2021 and earlier. FINDINGS: Portable AP semi upright view at 0525 hours. Continued veiling opacities in the lungs left greater than right. Right chest dual lumen dialysis type catheter is stable. Visualized tracheal air column is within normal limits. Stable cardiac size and mediastinal contours. No pneumothorax or pulmonary edema. No air bronchograms identified. Bilateral nipple shadows. No acute osseous abnormality identified. IMPRESSION: Ongoing left > right pleural effusions. No new cardiopulmonary abnormality. Electronically Signed   By: Genevie Ann M.D.   On: 05/09/2021 08:25   Overnight EEG with video  Result Date: 05/09/2021 [name], MD     05/09/2021  8:24 AM Patient Name: Amy [name] MRN: [id number] Epilepsy Attending: [name] Referring Physician/Provider: Dr Su Monks Duration: 05/08/2021 1651 to 05/09/2021 0810 Patient history: 71 year old female presenting with unresponsiveness that aborted with Ativan. EEG to evaluate for seizure Level of alertness:  lethargic AEDs during EEG study: None Technical aspects: This EEG study was done with scalp electrodes positioned according to the 10-20 International system of electrode placement. Electrical activity was acquired at a sampling rate of 500Hz  and reviewed with a high frequency filter of 70Hz  and a low frequency filter of 1Hz . EEG data were recorded continuously and digitally stored. Description: EEG showed continuous generalized polymorphic disorganized mixed frequencies with predominantly 5 to 9 Hz theta-alpha activity admixed 2-3hz  delta slowing. EEG also showed  intermittent 2-3 seconds of generalized eeg attenuation. There was also intermittent 15-18Hz  frontocentral beta activity. Sharp transitn s were noted in left temporal region consistent with breach artifact. Event button was pressed on 05/08/2021 at 1939. During the event patient was laying in bed had left ar  jerking which then progressed to semi rhythmic jerking of bilateral upper extremities. Concomitant EEG before, during and after the event did not show any EEG changes suggest seizure. Hyperventilation and photic stimulation were not performed.   ABNORMALITY - Continuous slow, generalized - Breach artifact, left temporal region IMPRESSION: This study is suggestive of cortical dysfunction arising from left temporal region consistent with underlying craniotomy. There is also severe diffuse encephalopathy, nonspecific etiology. Event button was pressed on 05/08/2021 at 1939. During the event patient was laying in bed had left ar  jerking which then progressed to semi rhythmic jerking of bilateral upper extremities. Concomitant EEG didn't show any EEG changes suggest seizure. However focal motor seizure amy not be seen on scalp eeg. Therefore clinical correlation is recommended. [name]   CT HEAD CODE STROKE WO CONTRAST  Result Date: 05/08/2021 CLINICAL DATA:  Code stroke. 71 year old female with altered mental status. Dialysis patient. EXAM: CT HEAD WITHOUT CONTRAST TECHNIQUE: Contiguous axial images were obtained from the base of the skull through the vertex without intravenous contrast. COMPARISON:  Brain MRI 06/20/2020.  Head CT 12/22/2020. FINDINGS: Brain: Chronic dural thickening and small low-density extra-axial collection underlying a left craniotomy is stable to decreased since August (series 3, image 16). No midline shift, mass effect, or evidence of intracranial mass lesion. No acute intracranial hemorrhage identified. No ventriculomegaly. Extensive Patchy and confluent bilateral cerebral white  matter hypodensity. And there is a small area of chronic appearing encephalomalacia in the posteroinferior left temporal lobe (  series 3, image 12) along the posterior margin of the craniotomy. Elsewhere stable gray-white matter differentiation. No cortically based acute infarct identified. No suspicious intracranial vascular hyperdensity. Vascular: Calcified atherosclerosis at the skull base. Skull: Chronic left side craniotomy is stable. No acute osseous abnormality identified. Sinuses/Orbits: Visualized paranasal sinuses and mastoids are stable and well aerated. Other: No acute orbit or scalp soft tissue finding. ASPECTS [facility] Stroke Program Early CT Score) Total score (0-10 with 10 being normal): 10, chronic left temporal lobe encephalomalacia suspected. IMPRESSION: 1. No acute cortically based infarct or acute intracranial hemorrhage identified. ASPECTS 10. 2. Previous left side craniotomy and subdural evacuation with stable to decreased dural thickening, hygroma. 3. Left inferior temporal lobe encephalomalacia and advanced chronic small vessel disease. 4. These results were communicated to Dr. Leonel Ramsay at 9:41 am on 05/08/2021 by text page via the [facility] messaging system. Electronically Signed   By: Genevie Ann M.D.   On: 05/08/2021 09:42   CT ANGIO HEAD NECK W WO CM (CODE STROKE)  Result Date: 05/08/2021 CLINICAL DATA:  Code stroke. 71 year old female with altered mental status. Dialysis patient. EXAM: CT ANGIOGRAPHY HEAD AND NECK TECHNIQUE: Multidetector CT imaging of the head and neck was performed using the standard protocol during bolus administration of intravenous contrast. Multiplanar CT image reconstructions and MIPs were obtained to evaluate the vascular anatomy. Carotid stenosis measurements (when applicable) are obtained utilizing NASCET criteria, using the distal internal carotid diameter as the denominator. CONTRAST:  27mL OMNIPAQUE IOHEXOL 350 MG/ML SOLN COMPARISON:  Plain head CT 0912 hours  today. CTA head and neck 06/19/2020. chest CT today reported separately. FINDINGS: CTA NECK Skeleton: Chronic lower cervical spine degeneration. Sequelae of left craniotomy is new from the prior CTA. No acute osseous abnormality identified. Upper chest: Large layering left pleural effusion with compressive left lung atelectasis, new from 06/19/2020. Small layering right pleural effusion. Mild apical septal thickening bilaterally. Other neck: Right IJ approach dual lumen dialysis type catheter. Mild generalized body wall edema is new from the prior CTA. Chronic thyroid nodules, up to 2 cm appear stable or decreased. Recommend thyroid US (ref: J Am Coll Radiol. 2015 Feb;12(2): 143-50). Otherwise negative neck soft tissues. Aortic arch: 3 vessel arch configuration.  No arch atherosclerosis. Right carotid system: Negative right CCA. Minimal right ICA origin plaque. Tortuous right ICA distal to the bulb but no stenosis. Left carotid system: Minimal plaque at the left ICA origin. Mild tortuosity without stenosis. Vertebral arteries: Negative proximal right subclavian artery and right vertebral artery origin. Mildly tortuous right vertebral is widely patent to the skull base. Negative proximal left subclavian artery and left vertebral artery origin. Mildly tortuous left vertebral artery is widely patent to the skull base. CTA HEAD Posterior circulation: Chronic left vertebral artery V4 calcified plaque proximal to the normal PICA origin. Stable mild-to-moderate stenosis there (series 7, image 128). Mild right V4 calcified plaque without significant stenosis. Patent vertebrobasilar junction. Normal right PICA. Patent basilar artery without stenosis. Normal SCA and PCA origins. Posterior communicating arteries are diminutive or absent. Bilateral PCA branches are stable and within normal limits. Anterior circulation: Both ICA siphons are patent. Extensive bilateral siphon calcified plaque. Moderate distal cavernous and  supraclinoid segment stenosis appears stable from last year. Similar moderate supraclinoid stenosis appears stable, although distal cavernous stenosis may have increased on series 7, image 101, moderate to severe. Patent carotid termini. Patent MCA and ACA origins. Diminutive or absent anterior communicating artery. Bilateral ACA branches are within normal limits. Right MCA M1 segment  and trifurcation are patent without stenosis. Right MCA branches are stable and within normal limits. Mild irregularity and stenosis at the left MCA origin is stable. Left MCA M1 segment and trifurcation otherwise patent without stenosis. Left MCA branches are stable and within normal limits. Venous sinuses: Patent. Anatomic variants: None. Review of the MIP images confirms the above findings IMPRESSION: 1. Negative for large vessel occlusion. 2. Minimal extracranial atherosclerosis. But extensive ICA siphon calcified plaque with moderate left and moderate to severe right siphon stenosis. Mild to moderate left vertebral V4 stenosis due to calcified plaque. 3. Large layering left pleural effusion with compressive atelectasis, new from CTA 06/19/2020. See Chest CT reported separately. 4. Chronic thyroid nodules, which meet consensus criteria for follow-up Thyroid Ultrasound (ref: J Am Coll Radiol. 2015 Feb;12(2): 143-50). Salient findings were communicated to Dr. Leonel Ramsay at 9:53 am on 05/08/2021 by text page via the [facility] messaging system. Electronically Signed   By: Genevie Ann M.D.   On: 05/08/2021 09:54    ROS: Unable to complete ROS due to patient being unresponsive   Physical Exam: Vitals:   05/09/21 0830 05/09/21 0845 05/09/21 0900 05/09/21 0915  BP: (!) 174/81 (!) 168/83 (!) 158/86 (!) 176/83  Pulse: 96 95 99 100  Resp: (!) 22 (!) 23 (!) 25 20  Temp:      TempSrc:      SpO2: 92% 95% 94% 93%     General: chronically ill appearing female in NAD Head: no scleral icterus, MMM Neck: Supple. No lymphadenopathy Lungs:  +scattered rhonchi, no rales, nml WOB on RA Heart: RRR. No murmur, rubs or gallops.  Abdomen: soft, nontender, +BS, no guarding, no rebound tenderness Lower extremities:trace edema b/l Neuro: unresponsive  Dialysis Access: [facility]  Dialysis Orders:  MWF - [facility] MWF [facility] EDW 56kg BFR 400 DFR 500 R IJ perm cath  Assessment/Plan:  Altered mental status/unresponsiveness - etiology unclear. No stroke on CT per neuro. concern for seizure activity/status epilepticus with positive response to Ativan but EEG so far with no seizure forms noted.  Semi rhythmic jerking noted yesterday.  EEG overnight with severe diffuse encephalopathy of non specific etiology.  [place] following.   ESRD -  on HD MWF.  Missed HD yesterday.  Orders written for HD tonight at bedside in ICU.   Hypertension - Blood pressure elevated today. On cardene drip. Per neuro SBP goal 140-180.  Home BP meds held.  Volume - Acute on chronic pleural effusion noted.  Plans for chest tube, deferred today due to elevated INR.  Trace edema noted on exam, UF as tolerated with HD.   Anemia of CKD - Hgb 10.0.  Hold ESA for now and monitor labs.    Secondary Hyperparathyroidism -  Calcium in goal.  Not on binders as outpatient.   Nutrition - currently NPO Leukocytosis - checking UA and cultures. Per PMD Hx SDH s/p craniotomy   Jen Mow, PA-C [facility] 05/09/2021, 9:41 AM     Seen and examined independently.  Agree with note and exam as documented above by physician extender and as noted here.  ESRD on HD who presented to OSH with ams and aphasia.  Per daughter she was recently given valtrex recently for shingles on her face which is crusting.  Code stroke was called with imaging as above.  She was seen to have seizure-like activity.  She was transferred to [facility]. Her husband and daughter are at bedside.  She states that 2022 was rough year for the  patient and this year is starting out rough, too; not sure what else  she can take.   General adult female in bed critically ill undergoing EEG monitoring HEENT normocephalic atraumatic  Neck supple trachea midline Lungs clear to auscultation bilaterally on room air Heart regular rate and rhythm no rubs or gallops appreciated Abdomen soft nontender nondistended Extremities no edema no cyanosis or clubbing Neuro eyes open but she does not track visually; she does not follow motor commands for me Access tunneled dialysis catheter in place   # Seizure /altered mental status - would give ativan PRN   - neuro consulted - meds per primary team and neurology  # ESRD  - HD today  - normally MWF [facility] patient   # HTN  - optimize volume with HD - she's going back on cardene gtt   # Pleural effusion  - per pulm  # Anemia CKD - defer ESA  # Secondary hyperpara - as above   Claudia Desanctis, MD 05/09/2021  3:19 PM

## 2021-05-09 NOTE — Progress Notes (Signed)
Infection Prevention  Received call from: bedside RN Unit: 4N ICU Regarding: h/o VRE and shingles  IP Recommendation: RN reports recent history of VRE (<12 months since positive culture).  The patient requires contact precautions for this.  The RN also reports the patient has a h/o shingles and has crusted vesicles under the nose.  IP does not recommend airborne precautions as the vesicles are dry and crusted over.  Informed RN that if the vesicle were to start draining then airborne precautions, to include a negative pressure room, would be needed in addition to contact precautions.    Thank you for consulting Infection Prevention.   Noel Christmas, RN

## 2021-05-09 NOTE — Progress Notes (Signed)
NAME:  Amy [name], MRN:  [id number], DOB:  1951-03-13, LOS: 1 ADMISSION DATE:  05/08/2021, CONSULTATION DATE:  05/08/21 REFERRING MD:  [facility], CHIEF COMPLAINT:  AMS   History of Present Illness:  71 year old woman with hx of subdural hematoma and prior seizures who presents from SNF with decreased responsiveness.  Evaluated by neurology at [facility] who evaluated a spot EEG that raised concern for ongoing seizures so transferred to [facility] for formal management.  History per chart review as patient is encephalopathic.  Other workup in ER revealed slightly high INR, new large left pleural effusion.  Pertinent  Medical History  Anxiety GERD HTN Hyponatremia in 2018 attributed to HCTZ Subdural 2022 CAD with prior DES Ischemic cardiomyopathy ESRD on HD Malfunctioning tunneled catheter on warfarin?  Cardiologist note seems confused by this too  Significant Hospital Events: Including procedures, antibiotic start and stop dates in addition to other pertinent events   1/6 ER admit  Interim History / Subjective:   No acute events overnight.  Off nicardipine drip  Objective   Blood pressure (!) 176/83, pulse 100, temperature 97.8 F (36.6 C), temperature source Oral, resp. rate 20, SpO2 93 %.        Intake/Output Summary (Last 24 hours) at 05/09/2021 1016 Last data filed at 05/09/2021 0800 Gross per 24 hour  Intake 120.76 ml  Output --  Net 120.76 ml   There were no vitals filed for this visit.  Examination: Gen:      No acute distress, chronically ill looking HEENT:  EOMI, sclera anicteric Neck:     No masses; no thyromegaly Lungs:    Clear to auscultation bilaterally; normal respiratory effort CV:         Regular rate and rhythm; no murmurs Abd:      + bowel sounds; soft, non-tender; no palpable masses, no distension Ext:    No edema; adequate peripheral perfusion Skin:      Warm and dry; no rash Neuro: Awake, does not respond to commands.  Nonpurposeful  Lab/imaging reviewed INR  remains elevated at 4, WBC 16.8  Resolved Hospital Problem list   N/a  Assessment & Plan:  Acute metabolic encephalopathy r/o status epilepticus Subdural hematoma ~9 mo ago P: Continue antiepileptics, EEG monitoring per neuro Follow encephalopathy  Hypertensive urgency vs. Emergency on cardene drip Hx of CAD w/ prior DES Hx of ischemic cardiomyopathy P: -per neuro; SBP goal 140-180 Off Cardene drip.  Start hydralazine as needed IV Lopressor  Acute on chronic pleural effusion: large left and moderate right P: I suspect this is from chronic volume overload, anasarca She had a CT abdomen pelvis in August of this year with similar sized effusions Will defer chest tube placement as I think this is chronic and INR remains  ESRD on HD dialysis P: Nephrology consulted.  Will get hemodialysis  Hyponatremia: hx of hyponatremia P: Follow labs  Supratherapeutic INR Warfarin use for HD catheter patency.  Per family she is on low-dose Coumadin to prevent catheter clotting P: Holding Coumadin  Leukocytosis P: Follow cultures  Hx of anxiety P: Holding home meds  GERD P: PPI  Anemia: likely chronic due to ESRD P: Follow CBC  HLD P: Holding statin  Severe malnutrition, present on admission She has anasarca with severe protein calorie malnutrition Will need nutrition consult when she is able to take p.o.  Goals of care Discussed with husband and daughter at bedside today.  She has had a declining course with multiple admissions over  the past year.  They reaffirmed DNR  Best Practice (right click and "Reselect all SmartList Selections" daily)   Diet/type: NPO DVT prophylaxis: SCD GI prophylaxis: PPI Lines: N/A Foley:  N/A Code Status:  DNR Last date of multidisciplinary goals of care discussion [1/7 husband and daughter at bedside; they state that [name] is a DNR and would not want to be intubated.]  Critical care time:    The patient is critically ill  with multiple organ system failure and requires high complexity decision making for assessment and support, frequent evaluation and titration of therapies, advanced monitoring, review of radiographic studies and interpretation of complex data.   Critical Care Time devoted to patient care services, exclusive of separately billable procedures, described in this note is 35 minutes.   Marshell Garfinkel MD [facility] See Amion for pager  If no response to pager , please call [phone] until 7pm After 7:00 pm call Elink  [phone] 05/09/2021, 10:16 AM

## 2021-05-10 DIAGNOSIS — R569 Unspecified convulsions: Secondary | ICD-10-CM | POA: Diagnosis not present

## 2021-05-10 DIAGNOSIS — Z7189 Other specified counseling: Secondary | ICD-10-CM

## 2021-05-10 DIAGNOSIS — Z515 Encounter for palliative care: Secondary | ICD-10-CM

## 2021-05-10 LAB — RENAL FUNCTION PANEL
Albumin: 2.6 g/dL — ABNORMAL LOW (ref 3.5–5.0)
Anion gap: 14 (ref 5–15)
BUN: 41 mg/dL — ABNORMAL HIGH (ref 8–23)
CO2: 20 mmol/L — ABNORMAL LOW (ref 22–32)
Calcium: 9 mg/dL (ref 8.9–10.3)
Chloride: 99 mmol/L (ref 98–111)
Creatinine, Ser: 4.68 mg/dL — ABNORMAL HIGH (ref 0.44–1.00)
GFR, Estimated: 9 mL/min — ABNORMAL LOW (ref >60)
Glucose, Bld: 103 mg/dL — ABNORMAL HIGH (ref 70–99)
Phosphorus: 4.8 mg/dL — ABNORMAL HIGH (ref 2.5–4.6)
Potassium: 3.7 mmol/L (ref 3.5–5.1)
Sodium: 133 mmol/L — ABNORMAL LOW (ref 135–145)

## 2021-05-10 LAB — GLUCOSE, CAPILLARY
Glucose-Capillary: 106 mg/dL — ABNORMAL HIGH (ref 70–99)
Glucose-Capillary: 92 mg/dL (ref 70–99)
Glucose-Capillary: 90 mg/dL (ref 70–99)
Glucose-Capillary: 89 mg/dL (ref 70–99)

## 2021-05-10 LAB — CBC
HCT: 29.8 % — ABNORMAL LOW (ref 36.0–46.0)
Hemoglobin: 9.8 g/dL — ABNORMAL LOW (ref 12.0–15.0)
MCH: 30.1 pg (ref 26.0–34.0)
MCHC: 32.9 g/dL (ref 30.0–36.0)
MCV: 91.4 fL (ref 80.0–100.0)
Platelets: 307 10*3/uL (ref 150–400)
RBC: 3.26 MIL/uL — ABNORMAL LOW (ref 3.87–5.11)
RDW: 16.5 % — ABNORMAL HIGH (ref 11.5–15.5)
WBC: 22.8 10*3/uL — ABNORMAL HIGH (ref 4.0–10.5)
nRBC: 0 % (ref 0.0–0.2)

## 2021-05-10 LAB — HEPATITIS B SURFACE ANTIGEN: Hepatitis B Surface Ag: NONREACTIVE

## 2021-05-10 LAB — PROTIME-INR
INR: 5.2 (ref 0.8–1.2)
Prothrombin Time: 47.8 seconds — ABNORMAL HIGH (ref 11.4–15.2)

## 2021-05-10 MED ORDER — HEPARIN SODIUM (PORCINE) 1000 UNIT/ML DIALYSIS: 1000.0000 [IU] | INTRAMUSCULAR | Status: DC | PRN

## 2021-05-10 MED ORDER — CHLORHEXIDINE GLUCONATE CLOTH 2 % EX PADS
6.0000 | MEDICATED_PAD | Freq: Every day | CUTANEOUS | Status: DC
  Administered 2021-05-11 – 2021-05-17 (×6): 6 via TOPICAL

## 2021-05-10 MED ORDER — ALTEPLASE 2 MG IJ SOLR: 2.0000 mg | Freq: Once | INTRAMUSCULAR | Status: DC | PRN

## 2021-05-10 MED ORDER — VITAMIN K1 10 MG/ML IJ SOLN
5.0000 mg | Freq: Once | INTRAMUSCULAR | Status: AC
  Administered 2021-05-10: 5 mg via INTRAVENOUS
  Filled 2021-05-10: qty 0.5

## 2021-05-10 MED ORDER — PENTAFLUOROPROP-TETRAFLUOROETH EX AERO: 1.0000 "application " | INHALATION_SPRAY | CUTANEOUS | Status: DC | PRN

## 2021-05-10 MED ORDER — SODIUM CHLORIDE 0.9 % IV SOLN: 100.0000 mL | INTRAVENOUS | Status: DC | PRN

## 2021-05-10 MED ORDER — LIDOCAINE-PRILOCAINE 2.5-2.5 % EX CREA
1.0000 "application " | TOPICAL_CREAM | CUTANEOUS | Status: DC | PRN
  Filled 2021-05-10: qty 5

## 2021-05-10 MED ORDER — LIDOCAINE HCL (PF) 1 % IJ SOLN: 5.0000 mL | INTRAMUSCULAR | Status: DC | PRN

## 2021-05-10 NOTE — Progress Notes (Addendum)
CRITICAL VALUE STICKER  CRITICAL VALUE: INR 5.2  RECEIVER (on-site recipient of call): Orie Rout RN  [employer] NOTIFIED: [phone]  MESSENGER (representative from lab): B. Emi Belfast  MD NOTIFIED: Dr. Vaughan Browner  TIME OF NOTIFICATION: 0920  RESPONSE: No new orders at present    11:12 AM  Vit K ordered.

## 2021-05-10 NOTE — Procedures (Addendum)
Patient Name: Amy [name]  MRN: [id number]  Epilepsy Attending: [name]  Referring Physician/Provider: Dr Su Monks Duration: 05/09/2021 1651 to 05/10/2021 1651   Patient history: 71 year old female presenting with unresponsiveness that aborted with Ativan. EEG to evaluate for seizure   Level of alertness:  lethargic    AEDs during EEG study: LCM   Technical aspects: This EEG study was done with scalp electrodes positioned according to the 10-20 International system of electrode placement. Electrical activity was acquired at a sampling rate of 500Hz  and reviewed with a high frequency filter of 70Hz  and a low frequency filter of 1Hz . EEG data were recorded continuously and digitally stored.    Description: EEG showed continuous generalized and maximal left temporal predominantly 5 to 9 Hz theta-alpha activity admixed 2-3hz  delta slowing. Spikes were noted in left posterior temporal region. Hyperventilation and photic stimulation were not performed.      ABNORMALITY - Continuous slow, generalized and maximal left temporal region - Spike, left posterior temporal region     IMPRESSION: This study showed evidence of epileptogenicity arising from left posterior temporal region. Additionally, study is suggestive of cortical dysfunction arising from left temporal region likely due to underlying craniotomy. There is also moderate diffuse encephalopathy, nonspecific etiology. No seizure were seen during this study.   Amy [name] Amy [name]

## 2021-05-10 NOTE — Progress Notes (Signed)
LTM Maintain at bedside. No skin breakdown noted. Results pending

## 2021-05-10 NOTE — Consult Note (Signed)
Palliative Medicine Inpatient Consult Note  Consulting Provider: Marshell Garfinkel, MD  Reason for consult:   [facility] Consult  Reason for Consult? goals of care   HPI:  Per intensivist progress note --> 71 year old woman with hx of subdural hematoma and prior seizures, HTN, GERD, anxiety, ESRD on HD, iCMY, CAD w. DES. Tarahji presents from [facility] with decreased responsiveness.  Evaluated by neurology at [facility] who evaluated a spot EEG that raised concern for ongoing seizures so transferred to [name] for formal management.  History per chart review as patient is encephalopathic.   Other workup in ER revealed slightly high INR, new large left pleural effusion.  Palliative care was consulted in the setting of a consistent decline to further discuss goals of care with Kirrah's family. It appears she has had three inpatient admissions and one ER visit in the past six months.   Clinical Assessment/Goals of Care:  *Please note that this is a verbal dictation therefore any spelling or grammatical errors are due to the "[place] [employer]" system interpretation.  I have reviewed medical records including EPIC notes, labs and imaging, received report from bedside RN, assessed the patient who is in bed non-responsive.    I met with patients spouse, Legrand Como and  daughter, Jarrett Soho to further discuss diagnosis prognosis, [place], EOL wishes, disposition and options.   I introduced Palliative Medicine as specialized medical care for people living with serious illness. It focuses on providing relief from the symptoms and stress of a serious illness. The goal is to improve quality of life for both the patient and the family.  Medical History Review and Understanding:  A detailed review of Kadance's clinical course over the last 1 year was held.  Per her spouse and daughter she has had a very complicated time starting with a femur fracture in December of 2021 which  was followed by COVID infection in January 2022.  This caused the long-term effects of patient having reflux.  In February 2022 Lucy experienced a seizure due to low electrolytes in the setting of C. difficile.  She thereafter received cardiac stents.  She had a sudden subdural hematoma requiring her to go to [facility] for an emergent evacuation surgery.    She went to rehabilitation and had been progressing well at 1 point and has now gotten to the point where she is more so "bedridden".  She has been receiving dialysis for about 4 years now at 1 point it was peritoneal dialysis which agreed well with her though in recent months she has been required to go back on hemodialysis. This has been quite complicating and exhausting for her.  We reviewed the gravity of what Keishla has been through over the past year and how she has continued to decline despite aggressive interventions.  We discussed that she is quite chronically ill and unfortunately her clinical course will likely continue to follow a similar pattern.  Social History:  Jemia has lived in [place], [place] throughout her life.  She met her husband, Legrand Como when she was in the ninth grade.  They have been together for greater than 49 years.  They share 1 daughter, Jarrett Soho.  They have no grandchildren.  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  She has been retired for the past 5 years.  She is identified as a woman who loves everyone and  has been a wonderful mother.  Functional and Nutritional State:  At baseline, she requires +2 assist to transfer to her w/c or use of a lift; pt is able to assist with bathing and feeds herself. Family states she has short term memory loss at baseline, however could carry on a "normal conversation".  Palliative Symptoms: Generalized weakness-in the setting of chronic debility, frailty-was receiving physical  therapy and Occupational Therapy at [facility] rehabilitation.  Advance Directives: Patient's surrogate decision maker is her spouse, Loveda Colaizzi.  Code Status: Concepts specific to code status, artifical feeding and hydration, continued IV antibiotics and rehospitalization was had.  We reviewed that when Karen was in her right state of mind she was able to tell the medical team on a prior hospital admission she would not want cardiopulmonary resuscitation or intubation.  She had then elected to make herself a DO NOT RESUSCITATE which her family say they wish to honor.  We reviewed that Macaela is on a life-saving measure being that she is receiving hemodialysis.  I shared that this is considered a form of life support which her family is aware of.  Goals for the Future: Family's goal is for pt to return to PLOF at [facility].  An honest discussion was held in the setting of Gayathri's clinical state to review the reality that she may not improve from this recent seizure.  Her family is hopeful that after her receiving hemodialysis today will "perk her up".  We reviewed that if it does not it seems like Halia has been through a tremendous amount and endured a degree of suffering.  I asked her family what she would think if she could step outside her body and be a bystander.  They share with me that she would not want to be going through these things.  They expressed to that her quality of life was not terribly great prior to admission.  We reviewed that if she should not improve the decision should be made to pursue comfort measures. We talked about transition to comfort measures in house and what that would entail inclusive of medications to control pain, dyspnea, agitation, nausea, itching, and hiccups.  We discussed stopping all uneccessary measures such as hemodialysis, cardiac monitoring, blood draws, needle sticks, and frequent vital signs.   Utilized reflective listening throughout our time  together, as this was clearly a very tearful topic.  Discussed the importance of continued conversation with family and their  medical providers regarding overall plan of care and treatment options, ensuring decisions are within the context of the patients values and GOCs.  Decision Maker: Francesca Strome (spouse): [phone]  SUMMARY OF RECOMMENDATIONS   DNAR/DNI  Allow 24 hours to see if Analiah improves after hemodialysis treatment.  If no improvements are seen plan for meeting with family tomorrow in the morning to further discuss transition to comfort oriented care.  Have offered for chaplain to stop by which family at this time declines.  Ongoing support by the palliative care team  Code Status/Advance Care Planning: DNAR/DNI  Palliative Prophylaxis:  Aspiration, Bowel Regimen, Delirium Protocol, Frequent Pain Assessment, Oral Care, Palliative Wound Care, and Turn Reposition  Additional Recommendations (Limitations, Scope, Preferences): Continue current scope of care  Psycho-social/Spiritual:  Desire for further Chaplaincy support: At this time family declines Additional Recommendations: Education on acute on chronic disease body processes and persistent decline in the setting of disease burden   Prognosis: Prognosis is quite limited if opts to stop measures such as dialysis  she will have a less than 2-week prognosis.  If dialysis is continued and Nira neglects to become alert her prognosis remains equally is poor though extended a bit longer to about a month.  Justine wakes up and can interact and can eat and drink she would certainly meet a prognosis appropriate for outpatient hospice of less than 6 months.  Discharge Planning: Discharge plan is uncertain at this point in time.  Vitals:   05/10/21 0500 05/10/21 0600  BP: (!) 164/68 (!) 176/68  Pulse: 81 84  Resp: 14 16  Temp:    SpO2: 92% 94%    Intake/Output Summary (Last 24 hours) at 05/10/2021 0156 Last data filed at  05/09/2021 2300 Gross per 24 hour  Intake 205.78 ml  Output 60 ml  Net 145.78 ml   Gen: Exceptionally frail elderly female appears far older than stated age 64: moist mucous membranes CV: Regular rate and rhythm PULM: On room air ABD: Not distended EXT: No edema  Neuro: Somnolent/lethargic  PPS: 10%   This conversation/these recommendations were discussed with patient primary care team, Dr. Vaughan Browner  Time In: 0930 Time Out: 1045 Total Time: 46  MDM high in the setting of metabolic encephalopathy, end-stage renal disease.  Palliative care is helping to further identify whether or not to further escalate/de-escalate care.  Goals of care conversations are ongoing. ______________________________________________________ [place] Team Team Cell Phone: [phone] Please utilize secure chat with additional questions, if there is no response within 30 minutes please call the above phone number  Palliative Medicine Team providers are available by phone from 7am to 7pm daily and can be reached through the team cell phone.  Should this patient require assistance outside of these hours, please call the patient's attending physician.

## 2021-05-10 NOTE — Progress Notes (Signed)
NAME:  Amy [name], MRN:  [id number], DOB:  April 26, 1951, LOS: 2 ADMISSION DATE:  05/08/2021, CONSULTATION DATE:  05/08/21 REFERRING MD:  [facility], CHIEF COMPLAINT:  AMS   History of Present Illness:  71 year old woman with hx of subdural hematoma and prior seizures who presents from SNF with decreased responsiveness.  Evaluated by neurology at [facility] who evaluated a spot EEG that raised concern for ongoing seizures so transferred to [facility] for formal management.  History per chart review as patient is encephalopathic.  Other workup in ER revealed slightly high INR, new large left pleural effusion.  Pertinent  Medical History  Anxiety GERD HTN Hyponatremia in 2018 attributed to HCTZ Subdural 2022 CAD with prior DES Ischemic cardiomyopathy ESRD on HD Malfunctioning tunneled catheter on warfarin?  Cardiologist note seems confused by this too  Significant Hospital Events: Including procedures, antibiotic start and stop dates in addition to other pertinent events   1/6 ER admit 1/7 Off nicardipine drip  Interim History / Subjective:   Intermittently on nicardipine drip.  Mental status remains poor Getting hemodialysis today  Objective   Blood pressure (!) 145/86, pulse 90, temperature 97.9 F (36.6 C), temperature source Axillary, resp. rate 20, weight 53 kg, SpO2 97 %.        Intake/Output Summary (Last 24 hours) at 05/10/2021 1103 Last data filed at 05/10/2021 1000 Gross per 24 hour  Intake 272.95 ml  Output 60 ml  Net 212.95 ml   Filed Weights   05/10/21 0900  Weight: 53 kg    Examination: Gen:      No acute distress, Chronically ill appearing HEENT:  EOMI, sclera anicteric Neck:     No masses; no thyromegaly Lungs:    Clear to auscultation bilaterally; normal respiratory effort CV:         Regular rate and rhythm; no murmurs Abd:      + bowel sounds; soft, non-tender; no palpable masses, no distension Ext:    No edema; adequate peripheral perfusion Skin:      Warm and dry;  no rash Neuro: Asleep, does not respond to commands  Lab/imaging reviewed Sodium 133, creatinine 4.68, WBC count 22.8 INR increased to 5.2  Resolved Hospital Problem list   N/a  Assessment & Plan:  Acute metabolic encephalopathy r/o status epilepticus Subdural hematoma ~9 mo ago P: Continue antiepileptics, EEG monitoring per neuro Follow encephalopathy  Hypertensive urgency vs. Emergency on cardene drip Hx of CAD w/ prior DES Hx of ischemic cardiomyopathy P: -per neuro; SBP goal 140-180 Wean off Cardene, hydralazine as needed IV Lopressor  Acute on chronic pleural effusion: large left and moderate right P: Suspect this is from chronic volume overload, anasarca She had a CT abdomen pelvis in August of this year with similar sized effusions Will defer chest tube placement as I think this is chronic and INR remains high Volume removal with dialysis  ESRD on HD dialysis P: Dialysis per nephrology  Hyponatremia: hx of hyponatremia P: Follow labs  Supratherapeutic INR Warfarin use for HD catheter patency.  Per family she is on low-dose Coumadin to prevent catheter clotting P: Holding Coumadin Vitamin K 5 mg 1 dose today  Leukocytosis P: Follow cultures  Hx of anxiety P: Holding home meds  GERD P: PPI  Anemia: likely chronic due to ESRD P: Follow CBC  HLD P: Holding statin  Severe malnutrition, present on admission She has anasarca with severe protein calorie malnutrition Will need nutrition consult when she is able to take  p.o.  Goals of care Discussed with husband and daughter at bedside today.  She has had a declining course with multiple admissions over the past year.  They reaffirmed DNR. Palliative care consulted  Best Practice (right click and "Reselect all SmartList Selections" daily)   Diet/type: NPO DVT prophylaxis: SCD GI prophylaxis: PPI Lines: N/A Foley:  N/A Code Status:  DNR Last date of multidisciplinary goals of care  discussion [1/7 husband and daughter at bedside; they state that Mrs. Golob is a DNR and would not want to be intubated.]  Critical care time:    The patient is critically ill with multiple organ system failure and requires high complexity decision making for assessment and support, frequent evaluation and titration of therapies, advanced monitoring, review of radiographic studies and interpretation of complex data.   Critical Care Time devoted to patient care services, exclusive of separately billable procedures, described in this note is 35 minutes.   Marshell Garfinkel MD [place] [facility] See Amion for pager  If no response to pager , please call [phone] until 7pm After 7:00 pm call Elink  [phone] 05/10/2021, 11:09 AM

## 2021-05-10 NOTE — Progress Notes (Signed)
Patient not dialyzed overnight. Spoke with dialysis RN this morning, will be to unit shortly. Consent in chart.

## 2021-05-10 NOTE — Progress Notes (Addendum)
[facility] Progress Note   Subjective:   Patient seen and examined at bedside. Nurse reports more alert yesterday afternoon, spontaneously opening eyes.  Missed dose of Vimat in transport and had loading dose given yesterday.    Objective Vitals:   05/10/21 0745 05/10/21 0800 05/10/21 0815 05/10/21 0830  BP: (!) 168/73 (!) 167/73 (!) 165/68 (!) 171/78  Pulse: 85 87 90 93  Resp: 18 19 16  (!) 25  Temp:  97.7 F (36.5 C)    TempSrc:      SpO2: 95% 95% 94% 95%   Physical Exam General:chronically ill appearing female, unresponsive Heart:RRR, no mrg Lungs: mostly CTAB anterolaterally, BS decreased, nml WOB on RA Abdomen:soft, NTND Extremities:2+ edema in b/l UE, 1+ edema in hips Dialysis Access: [facility]   There were no vitals filed for this visit.  Intake/Output Summary (Last 24 hours) at 05/10/2021 0906 Last data filed at 05/10/2021 0800 Gross per 24 hour  Intake 266.56 ml  Output 60 ml  Net 206.56 ml    Additional Objective Labs: Basic Metabolic Panel: Recent Labs  Lab 05/08/21 0919 05/09/21 0447  NA 129* 129*  K 3.5 4.0  CL 97* 97*  CO2 26 21*  GLUCOSE 102* 94  BUN 32* 35*  CREATININE 3.39* 3.95*  CALCIUM 8.7* 8.9   Liver Function Tests: Recent Labs  Lab 05/08/21 0919  AST 17  ALT 11  ALKPHOS 108  BILITOT 0.7  PROT 5.3*  ALBUMIN 2.5*   CBC: Recent Labs  Lab 05/08/21 0919 05/09/21 0557  WBC 11.3* 16.8*  NEUTROABS 8.9*  --   HGB 10.9* 10.0*  HCT 33.0* 31.0*  MCV 91.7 91.2  PLT 241 281   CBG: Recent Labs  Lab 05/09/21 1206 05/09/21 1600 05/09/21 2020 05/09/21 2341 05/10/21 0313  GLUCAP 113* 101* 105* 130* 106*   Studies/Results: CT CHEST W CONTRAST  Result Date: 05/08/2021 CLINICAL DATA:  Altered mental status.  Pleural effusion. EXAM: CT CHEST WITH CONTRAST TECHNIQUE: Multidetector CT imaging of the chest was performed during intravenous contrast administration. CONTRAST:  22mL OMNIPAQUE IOHEXOL 350 MG/ML SOLN COMPARISON:   Chest x-ray dated December 22, 2020. CTA chest dated June 23, 2020. FINDINGS: Cardiovascular: Unchanged borderline cardiomegaly. No pericardial effusion. No thoracic aortic aneurysm or dissection. No central pulmonary embolism. Coronary, aortic arch, and branch vessel atherosclerotic vascular disease. New tunneled right internal jugular dialysis catheter with tip at the cavoatrial junction. Mediastinum/Nodes: No enlarged mediastinal, hilar, or axillary lymph nodes. Unchanged 1.5 cm hypodense thyroid nodules in the right thyroid lobe and isthmus. Trachea and esophagus demonstrate no significant findings. Lungs/Pleura: Large left and moderate right pleural effusions. Near complete collapse of the left lower lobe. Dependent subsegmental atelectasis in the posterior right lower lobe. Upper lobe predominant smooth interlobular septal thickening. No consolidation or pneumothorax. Central airways are patent. Upper Abdomen: No acute abnormality. Unchanged nodular liver contour with multiple small hepatic cysts. Unchanged bilateral adrenal gland thickening and left upper pole renal calculi. Unchanged small hiatal hernia. Musculoskeletal: Diffuse anasarca. No acute or significant osseous findings. IMPRESSION: 1. Large left and moderate right pleural effusions with near complete collapse of the left lower lobe. 2. Mild pulmonary interstitial edema. 3. Multiple thyroid nodules which have slowly increased in size since 2018. Recommend thyroid US (ref: J Am Coll Radiol. 2015 Feb;12(2): 143-50). 4. Aortic Atherosclerosis (ICD10-I70.0). Electronically Signed   By: Titus Dubin M.D.   On: 05/08/2021 10:08   DG Chest Port 1 View  Result Date: 05/09/2021 CLINICAL DATA:  71 year old  female with shortness of breath. EXAM: PORTABLE CHEST 1 VIEW COMPARISON:  Chest CT 05/08/2021 and earlier. FINDINGS: Portable AP semi upright view at 0525 hours. Continued veiling opacities in the lungs left greater than right. Right chest dual  lumen dialysis type catheter is stable. Visualized tracheal air column is within normal limits. Stable cardiac size and mediastinal contours. No pneumothorax or pulmonary edema. No air bronchograms identified. Bilateral nipple shadows. No acute osseous abnormality identified. IMPRESSION: Ongoing left > right pleural effusions. No new cardiopulmonary abnormality. Electronically Signed   By: Genevie Ann M.D.   On: 05/09/2021 08:25   Overnight EEG with video  Result Date: 05/09/2021 [name], MD     05/09/2021  8:24 AM Patient Name: Amy [name] MRN: [id number] Epilepsy Attending: [name] Referring Physician/Provider: Dr Su Monks Duration: 05/08/2021 1651 to 05/09/2021 0810 Patient history: 71 year old female presenting with unresponsiveness that aborted with Ativan. EEG to evaluate for seizure Level of alertness:  lethargic AEDs during EEG study: None Technical aspects: This EEG study was done with scalp electrodes positioned according to the 10-20 International system of electrode placement. Electrical activity was acquired at a sampling rate of 500Hz  and reviewed with a high frequency filter of 70Hz  and a low frequency filter of 1Hz . EEG data were recorded continuously and digitally stored. Description: EEG showed continuous generalized polymorphic disorganized mixed frequencies with predominantly 5 to 9 Hz theta-alpha activity admixed 2-3hz  delta slowing. EEG also showed intermittent 2-3 seconds of generalized eeg attenuation. There was also intermittent 15-18Hz  frontocentral beta activity. Sharp transitn s were noted in left temporal region consistent with breach artifact. Event button was pressed on 05/08/2021 at 1939. During the event patient was laying in bed had left ar  jerking which then progressed to semi rhythmic jerking of bilateral upper extremities. Concomitant EEG before, during and after the event did not show any EEG changes suggest seizure. Hyperventilation and photic stimulation were  not performed.   ABNORMALITY - Continuous slow, generalized - Breach artifact, left temporal region IMPRESSION: This study is suggestive of cortical dysfunction arising from left temporal region consistent with underlying craniotomy. There is also severe diffuse encephalopathy, nonspecific etiology. Event button was pressed on 05/08/2021 at 1939. During the event patient was laying in bed had left ar  jerking which then progressed to semi rhythmic jerking of bilateral upper extremities. Concomitant EEG didn't show any EEG changes suggest seizure. However focal motor seizure amy not be seen on scalp eeg. Therefore clinical correlation is recommended. [name]   CT HEAD CODE STROKE WO CONTRAST  Result Date: 05/08/2021 CLINICAL DATA:  Code stroke. 71 year old female with altered mental status. Dialysis patient. EXAM: CT HEAD WITHOUT CONTRAST TECHNIQUE: Contiguous axial images were obtained from the base of the skull through the vertex without intravenous contrast. COMPARISON:  Brain MRI 06/20/2020.  Head CT 12/22/2020. FINDINGS: Brain: Chronic dural thickening and small low-density extra-axial collection underlying a left craniotomy is stable to decreased since August (series 3, image 16). No midline shift, mass effect, or evidence of intracranial mass lesion. No acute intracranial hemorrhage identified. No ventriculomegaly. Extensive Patchy and confluent bilateral cerebral white matter hypodensity. And there is a small area of chronic appearing encephalomalacia in the posteroinferior left temporal lobe (series 3, image 12) along the posterior margin of the craniotomy. Elsewhere stable gray-white matter differentiation. No cortically based acute infarct identified. No suspicious intracranial vascular hyperdensity. Vascular: Calcified atherosclerosis at the skull base. Skull: Chronic left side craniotomy is stable. No acute  osseous abnormality identified. Sinuses/Orbits: Visualized paranasal sinuses and  mastoids are stable and well aerated. Other: No acute orbit or scalp soft tissue finding. ASPECTS [facility] Stroke Program Early CT Score) Total score (0-10 with 10 being normal): 10, chronic left temporal lobe encephalomalacia suspected. IMPRESSION: 1. No acute cortically based infarct or acute intracranial hemorrhage identified. ASPECTS 10. 2. Previous left side craniotomy and subdural evacuation with stable to decreased dural thickening, hygroma. 3. Left inferior temporal lobe encephalomalacia and advanced chronic small vessel disease. 4. These results were communicated to Dr. Leonel Ramsay at 9:41 am on 05/08/2021 by text page via the [facility] messaging system. Electronically Signed   By: Genevie Ann M.D.   On: 05/08/2021 09:42   CT ANGIO HEAD NECK W WO CM (CODE STROKE)  Result Date: 05/08/2021 CLINICAL DATA:  Code stroke. 71 year old female with altered mental status. Dialysis patient. EXAM: CT ANGIOGRAPHY HEAD AND NECK TECHNIQUE: Multidetector CT imaging of the head and neck was performed using the standard protocol during bolus administration of intravenous contrast. Multiplanar CT image reconstructions and MIPs were obtained to evaluate the vascular anatomy. Carotid stenosis measurements (when applicable) are obtained utilizing NASCET criteria, using the distal internal carotid diameter as the denominator. CONTRAST:  52mL OMNIPAQUE IOHEXOL 350 MG/ML SOLN COMPARISON:  Plain head CT 0912 hours today. CTA head and neck 06/19/2020. chest CT today reported separately. FINDINGS: CTA NECK Skeleton: Chronic lower cervical spine degeneration. Sequelae of left craniotomy is new from the prior CTA. No acute osseous abnormality identified. Upper chest: Large layering left pleural effusion with compressive left lung atelectasis, new from 06/19/2020. Small layering right pleural effusion. Mild apical septal thickening bilaterally. Other neck: Right IJ approach dual lumen dialysis type catheter. Mild generalized body wall edema is  new from the prior CTA. Chronic thyroid nodules, up to 2 cm appear stable or decreased. Recommend thyroid US (ref: J Am Coll Radiol. 2015 Feb;12(2): 143-50). Otherwise negative neck soft tissues. Aortic arch: 3 vessel arch configuration.  No arch atherosclerosis. Right carotid system: Negative right CCA. Minimal right ICA origin plaque. Tortuous right ICA distal to the bulb but no stenosis. Left carotid system: Minimal plaque at the left ICA origin. Mild tortuosity without stenosis. Vertebral arteries: Negative proximal right subclavian artery and right vertebral artery origin. Mildly tortuous right vertebral is widely patent to the skull base. Negative proximal left subclavian artery and left vertebral artery origin. Mildly tortuous left vertebral artery is widely patent to the skull base. CTA HEAD Posterior circulation: Chronic left vertebral artery V4 calcified plaque proximal to the normal PICA origin. Stable mild-to-moderate stenosis there (series 7, image 128). Mild right V4 calcified plaque without significant stenosis. Patent vertebrobasilar junction. Normal right PICA. Patent basilar artery without stenosis. Normal SCA and PCA origins. Posterior communicating arteries are diminutive or absent. Bilateral PCA branches are stable and within normal limits. Anterior circulation: Both ICA siphons are patent. Extensive bilateral siphon calcified plaque. Moderate distal cavernous and supraclinoid segment stenosis appears stable from last year. Similar moderate supraclinoid stenosis appears stable, although distal cavernous stenosis may have increased on series 7, image 101, moderate to severe. Patent carotid termini. Patent MCA and ACA origins. Diminutive or absent anterior communicating artery. Bilateral ACA branches are within normal limits. Right MCA M1 segment and trifurcation are patent without stenosis. Right MCA branches are stable and within normal limits. Mild irregularity and stenosis at the left MCA  origin is stable. Left MCA M1 segment and trifurcation otherwise patent without stenosis. Left MCA branches are stable and  within normal limits. Venous sinuses: Patent. Anatomic variants: None. Review of the MIP images confirms the above findings IMPRESSION: 1. Negative for large vessel occlusion. 2. Minimal extracranial atherosclerosis. But extensive ICA siphon calcified plaque with moderate left and moderate to severe right siphon stenosis. Mild to moderate left vertebral V4 stenosis due to calcified plaque. 3. Large layering left pleural effusion with compressive atelectasis, new from CTA 06/19/2020. See Chest CT reported separately. 4. Chronic thyroid nodules, which meet consensus criteria for follow-up Thyroid Ultrasound (ref: J Am Coll Radiol. 2015 Feb;12(2): 143-50). Salient findings were communicated to Dr. Leonel Ramsay at 9:53 am on 05/08/2021 by text page via the [facility] messaging system. Electronically Signed   By: Genevie Ann M.D.   On: 05/08/2021 09:54    Medications:  sodium chloride     sodium chloride     lacosamide (VIMPAT) IV Stopped (05/09/21 2235)   niCARDipine 2 mg/hr (05/10/21 0800)    chlorhexidine  15 mL Mouth Rinse BID   Chlorhexidine Gluconate Cloth  6 each Topical Daily   Chlorhexidine Gluconate Cloth  6 each Topical Q0600   mouth rinse  15 mL Mouth Rinse q12n4p   metoprolol tartrate  5 mg Intravenous Q12H   pantoprazole (PROTONIX) IV  40 mg Intravenous QHS    Dialysis Orders: MWF - [facility] MWF [facility] EDW 56kg BFR 400 DFR 500 R IJ perm cath   Assessment/Plan:  Altered mental status/unresponsiveness - etiology unclear. Given valacyclovir prior to admit d/t shingles. UC ordered. No stroke on CT per neuro. concern for seizure activity/status epilepticus with positive response to Ativan but EEG so far with no seizure forms noted.  Semi rhythmic jerking noted yesterday.  EEG overnight with severe diffuse encephalopathy of non specific etiology. Patient getting set up for HD  now, hopeful for improvement in mental status if related to medication. Neruo following.   ESRD -  on HD MWF. HD today at bedside. Will write orders for HD again tomorrow per regular schedule.   Hypertension - Blood pressure elevated. On cardene drip. Per neuro SBP goal 140-180.  Home BP meds held.  Volume - Acute on chronic pleural effusion noted.  Plans for chest tube, deferred today due to elevated INR.  Increasing edema noted on exam, UF as tolerated with HD.   Anemia of CKD - Hgb 10.0.  Hold ESA for now and monitor labs.    Secondary Hyperparathyroidism -  Calcium in goal.  Not on binders as outpatient.   Nutrition - currently NPO Leukocytosis - checking UA and cultures. Per PMD Hx SDH s/p craniotomy  Shingles - lesions crusted over.  Given valacyclovir prior to admission. per PMD  Jen Mow, PA-C [facility] 05/10/2021,9:06 AM  LOS: 2 days   Seen and examined independently.  Agree with note and exam as documented above by physician extender and as noted here. Per RN the patient hasn't been following commands. Has moved extremities to stimuli per nursing.   General adult female in bed critically ill undergoing EEG monitoring HEENT normocephalic atraumatic  Neck supple trachea midline Lungs clear to auscultation bilaterally on room air Heart S1S2 no rub Abdomen soft nontender nondistended Extremities no edema no cyanosis or clubbing Neuro does not track visually; she does not follow motor commands  Access tunneled dialysis catheter in place    # Seizure /altered mental status - neuro consulted - meds per primary team and neurology   # ESRD  - HD today off schedule - normally MWF [facility] patient    #  HTN  - optimize volume with HD - all meds are IV currently   # Pleural effusion  - per pulm   # Anemia CKD - defer ESA today - would get outpatient HD rx info   Claudia Desanctis, MD 05/10/2021 1:47 PM

## 2021-05-11 ENCOUNTER — Inpatient Hospital Stay (HOSPITAL_COMMUNITY): Payer: Medicare Other

## 2021-05-11 DIAGNOSIS — R569 Unspecified convulsions: Secondary | ICD-10-CM | POA: Diagnosis not present

## 2021-05-11 LAB — CBC
HCT: 31.4 % — ABNORMAL LOW (ref 36.0–46.0)
Hemoglobin: 10.4 g/dL — ABNORMAL LOW (ref 12.0–15.0)
MCH: 30.4 pg (ref 26.0–34.0)
MCHC: 33.1 g/dL (ref 30.0–36.0)
MCV: 91.8 fL (ref 80.0–100.0)
Platelets: 213 10*3/uL (ref 150–400)
RBC: 3.42 MIL/uL — ABNORMAL LOW (ref 3.87–5.11)
RDW: 16.5 % — ABNORMAL HIGH (ref 11.5–15.5)
WBC: 15.6 10*3/uL — ABNORMAL HIGH (ref 4.0–10.5)
nRBC: 0 % (ref 0.0–0.2)

## 2021-05-11 LAB — HEPATITIS B SURFACE ANTIBODY,QUALITATIVE: Hep B S Ab: REACTIVE — AB

## 2021-05-11 LAB — GLUCOSE, CAPILLARY
Glucose-Capillary: 114 mg/dL — ABNORMAL HIGH (ref 70–99)
Glucose-Capillary: 117 mg/dL — ABNORMAL HIGH (ref 70–99)
Glucose-Capillary: 142 mg/dL — ABNORMAL HIGH (ref 70–99)
Glucose-Capillary: 69 mg/dL — ABNORMAL LOW (ref 70–99)
Glucose-Capillary: 71 mg/dL (ref 70–99)
Glucose-Capillary: 72 mg/dL (ref 70–99)
Glucose-Capillary: 75 mg/dL (ref 70–99)
Glucose-Capillary: 78 mg/dL (ref 70–99)

## 2021-05-11 LAB — BASIC METABOLIC PANEL
Anion gap: 11 (ref 5–15)
BUN: 17 mg/dL (ref 8–23)
CO2: 24 mmol/L (ref 22–32)
Calcium: 8.7 mg/dL — ABNORMAL LOW (ref 8.9–10.3)
Chloride: 96 mmol/L — ABNORMAL LOW (ref 98–111)
Creatinine, Ser: 2.73 mg/dL — ABNORMAL HIGH (ref 0.44–1.00)
GFR, Estimated: 18 mL/min — ABNORMAL LOW (ref >60)
Glucose, Bld: 78 mg/dL (ref 70–99)
Potassium: 3.3 mmol/L — ABNORMAL LOW (ref 3.5–5.1)
Sodium: 131 mmol/L — ABNORMAL LOW (ref 135–145)

## 2021-05-11 LAB — HEPATITIS B SURFACE ANTIBODY, QUANTITATIVE: Hep B S AB Quant (Post): 29.2 m[IU]/mL (ref >9.9)

## 2021-05-11 LAB — PHOSPHORUS
Phosphorus: 4.3 mg/dL (ref 2.5–4.6)
Phosphorus: 3.3 mg/dL (ref 2.5–4.6)

## 2021-05-11 LAB — MAGNESIUM
Magnesium: 1.8 mg/dL (ref 1.7–2.4)
Magnesium: 1.8 mg/dL (ref 1.7–2.4)

## 2021-05-11 LAB — PROTIME-INR
INR: 1.5 — ABNORMAL HIGH (ref 0.8–1.2)
Prothrombin Time: 17.6 seconds — ABNORMAL HIGH (ref 11.4–15.2)

## 2021-05-11 MED ORDER — RENA-VITE PO TABS
1.0000 | ORAL_TABLET | Freq: Every day | ORAL | Status: DC
  Filled 2021-05-11: qty 1

## 2021-05-11 MED ORDER — PROSOURCE TF PO LIQD
45.0000 mL | Freq: Two times a day (BID) | ORAL | Status: DC
  Administered 2021-05-11 – 2021-05-14 (×7): 45 mL
  Filled 2021-05-11 (×7): qty 45

## 2021-05-11 MED ORDER — DEXTROSE 5 % IV SOLN
10.0000 mg/kg | INTRAVENOUS | Status: DC
  Administered 2021-05-11: 530 mg via INTRAVENOUS
  Filled 2021-05-11 (×2): qty 10.6

## 2021-05-11 MED ORDER — SODIUM CHLORIDE 0.9 % IV SOLN: INTRAVENOUS | Status: DC

## 2021-05-11 MED ORDER — POTASSIUM CHLORIDE 10 MEQ/50ML IV SOLN
10.0000 meq | INTRAVENOUS | Status: DC
  Filled 2021-05-11 (×2): qty 50

## 2021-05-11 MED ORDER — RENA-VITE PO TABS
1.0000 | ORAL_TABLET | Freq: Every day | ORAL | Status: DC
  Administered 2021-05-12 – 2021-05-16 (×6): 1
  Filled 2021-05-11 (×7): qty 1

## 2021-05-11 MED ORDER — VITAL HIGH PROTEIN PO LIQD: 1000.0000 mL | ORAL | Status: DC

## 2021-05-11 MED ORDER — VALPROIC ACID 250 MG PO CAPS
250.0000 mg | ORAL_CAPSULE | Freq: Three times a day (TID) | ORAL | Status: DC
  Filled 2021-05-11 (×2): qty 1

## 2021-05-11 MED ORDER — VALPROIC ACID 250 MG/5ML PO SOLN
500.0000 mg | Freq: Two times a day (BID) | ORAL | Status: DC
  Administered 2021-05-11 – 2021-05-17 (×12): 500 mg
  Filled 2021-05-11 (×12): qty 10

## 2021-05-11 MED ORDER — POTASSIUM CHLORIDE 10 MEQ/100ML IV SOLN
10.0000 meq | INTRAVENOUS | Status: AC
  Administered 2021-05-11 (×2): 10 meq via INTRAVENOUS
  Filled 2021-05-11 (×2): qty 100

## 2021-05-11 MED ORDER — DEXTROSE 50 % IV SOLN
12.5000 g | INTRAVENOUS | Status: AC
  Administered 2021-05-11: 12.5 g via INTRAVENOUS
  Filled 2021-05-11: qty 50

## 2021-05-11 MED ORDER — OSMOLITE 1.5 CAL PO LIQD
1000.0000 mL | ORAL | Status: DC
  Administered 2021-05-11 – 2021-05-12 (×2): 1000 mL
  Filled 2021-05-11: qty 1000

## 2021-05-11 MED ORDER — SODIUM CHLORIDE 0.9 % IV SOLN
500.0000 mg | Freq: Once | INTRAVENOUS | Status: DC
  Filled 2021-05-11: qty 10

## 2021-05-11 NOTE — Progress Notes (Signed)
Subjective: No clinical seizure. Patient's daughter and husband at bedside state patient was doing well, able to communicate prior to admission.   ROS: Unable to obtain due to poor mental status  Examination  Vital signs in last 24 hours: Temp:  [97.7 F (36.5 C)-100.2 F (37.9 C)] 98.1 F (36.7 C) (01/09 1200) Pulse Rate:  [40-110] 82 (01/09 1230) Resp:  [13-26] 18 (01/09 1230) BP: (91-199)/(57-98) 153/75 (01/09 1230) SpO2:  [88 %-100 %] 96 % (01/09 1230)  General: lying in bed, cachetic looking CVS: pulse-normal rate and rhythm RS: breathing comfortably, coarse breath sounds BL Extremities: warm, no edema Neuro: awake, follows simple one step commands like squeezing hand but doesn't stick out her tongue, doesn't wiggle toes, non-verbal, PERLA, left gaze preference with right sided neglect, antigravity strength in all extremities with right hemiparesis   Basic Metabolic Panel: Recent Labs  Lab 05/08/21 0919 05/08/21 1701 05/09/21 0447 05/10/21 0858 05/11/21 0456  NA 129*  --  129* 133* 131*  K 3.5  --  4.0 3.7 3.3*  CL 97*  --  97* 99 96*  CO2 26  --  21* 20* 24  GLUCOSE 102*  --  94 103* 78  BUN 32*  --  35* 41* 17  CREATININE 3.39*  --  3.95* 4.68* 2.73*  CALCIUM 8.7*  --  8.9 9.0 8.7*  MG  --  1.7 1.8  --  1.8  PHOS  --   --   --  4.8* 4.3    CBC: Recent Labs  Lab 05/08/21 0919 05/09/21 0557 05/10/21 0858 05/11/21 0456  WBC 11.3* 16.8* 22.8* 15.6*  NEUTROABS 8.9*  --   --   --   HGB 10.9* 10.0* 9.8* 10.4*  HCT 33.0* 31.0* 29.8* 31.4*  MCV 91.7 91.2 91.4 91.8  PLT 241 281 307 213     Coagulation Studies: Recent Labs    05/09/21 0447 05/10/21 0653 05/11/21 0456  LABPROT 38.7* 47.8* 17.6*  INR 4.0* 5.2* 1.5*    Imaging CT head without contrast 05/08/2021:  1. No acute cortically based infarct or acute intracranial hemorrhage identified. ASPECTS 10. 2. Previous left side craniotomy and subdural evacuation with stable to decreased dural  thickening, hygroma. 3. Left inferior temporal lobe encephalomalacia and advanced chronic small vessel disease.  CTA head and neck 05/08/2021: No large vessel occlusion. Minimal extracranial atherosclerosis. But extensive ICA siphon calcified plaque with moderate left and moderate to severe right siphon stenosis. Mild to moderate left vertebral V4 stenosis due to calcified plaque.   ASSESSMENT AND PLAN: 71 year old female with seizures secondary to chronic left subdural hematoma who presented with unresponsiveness in the setting of previously weaned brivaracetam.   Epilepsy with breakthrough seizure Chronic subdural hematoma -patient somewhat improved and following commands per family but still has left gaze preference and is non verbal. Could be due to seizures vs stroke vs infection  Recommendations - I was planning to add another AED. ICU team added depakote already.I agree with depakote 500mg  BID  -Continue Vimpat 100 mg twice daily - Will unhook EEG to obtain MRI brain wo contrast to asses for acute abnormality - Patient's family reported she had shingles on her face prior to admission and was on valacyclovir. She has been afebrile. However, will obtain LP tomorrow and start empiric acyclovir -Continue seizure precautions -PRN IV Ativan 2 mg for clinical seizure-like activity - Management of rest of comorbidities per primary team - Discussed plan with Dr Lynetta Mare and family at bedside  CRITICAL  CARE Performed by: [name]   Total critical care time: 35 minutes  Critical care time was exclusive of separately billable procedures and treating other patients.  Critical care was necessary to treat or prevent imminent or life-threatening deterioration.  Critical care was time spent personally by me on the following activities: development of treatment plan with patient and/or surrogate as well as nursing, discussions with consultants, evaluation of patient's response to  treatment, examination of patient, obtaining history from patient or surrogate, ordering and performing treatments and interventions, ordering and review of laboratory studies, ordering and review of radiographic studies, pulse oximetry and re-evaluation of patient's condition.   Zeb Comfort Epilepsy [facility] For questions after 5pm please refer to AMION to reach the Neurologist on call

## 2021-05-11 NOTE — Progress Notes (Signed)
Pharmacy Antibiotic Note  Amy [name] is a 71 y.o. female admitted on 05/08/2021 with  altered mental status .  Pharmacy has been consulted for acyclovir dosing.  Per neurology note this am, family noted patient had shingles on her face prior to admission and was on valtex. Planning LP tomorrow, will start empiric acyclovir.   Patient with tmax of 100.2, wbc 15.6. Renal function impaired with scr of 2.7, improved from 4.6 on 1/8.   Plan: Acyclovir 10mg /kg every 24 hours Will add NS@10  ml/hr for hydration (patient is ESRD w/HD done today)  Weight:  (unable to retrieve accurate weight/ pt bed with vent attachment, mulltiple oxygen tanks.)  Temp (24hrs), Avg:98.6 F (37 C), Min:97.7 F (36.5 C), Max:100.2 F (37.9 C)  Recent Labs  Lab 05/08/21 0919 05/09/21 0447 05/09/21 0557 05/10/21 0858 05/11/21 0456  WBC 11.3*  --  16.8* 22.8* 15.6*  CREATININE 3.39* 3.95*  --  4.68* 2.73*    Estimated Creatinine Clearance: 15.6 mL/min (A) (by C-G formula based on SCr of 2.73 mg/dL (H)).    Allergies  Allergen Reactions   Cephalosporins Hives and Swelling   Clarithromycin Other (See Comments)    Unknown reaction   Ensure [Nutritional Supplements] Nausea And Vomiting   Gabapentin    Lunesta  [Eszopiclone]     Heart racing   Nepro [Compleat] Nausea And Vomiting   Penicillin V     Has patient had a PCN reaction causing immediate rash, facial/tongue/throat swelling, SOB or lightheadedness with hypotension: Yes Has patient had a PCN reaction causing severe rash involving mucus membranes or skin necrosis: No Has patient had a PCN reaction that required hospitalization No Has patient had a PCN reaction occurring within the last 10 years: No If all of the above answers are "NO", then may proceed with Cephalosporin use.   Sulfa Antibiotics     Unknown reaction   Erin Hearing PharmD., BCPS Clinical Pharmacist 05/11/2021 7:26 PM

## 2021-05-11 NOTE — Procedures (Signed)
Cortrak  Person Inserting Tube:  Jax Abdelrahman, Creola Corn, RD Tube Type:  Cortrak - 43 inches Tube Size:  10 Tube Location:  Left nare Initial Placement:  Stomach Secured by: Bridle Technique Used to Measure Tube Placement:  Marking at nare/corner of mouth Cortrak Secured At:  60 cm  Cortrak Tube Team Note:  Consult received to place a Cortrak feeding tube.   X-ray is required, abdominal x-ray has been ordered by the Cortrak team. Please confirm tube placement before using the Cortrak tube.   If the tube becomes dislodged please keep the tube and contact the Cortrak team at [email or web address] (password [email or web address]) for replacement.  If after hours and replacement cannot be delayed, place a NG tube and confirm placement with an abdominal x-ray.     Theone Stanley., MS, RD, LDN (she/her/hers) RD pager number and weekend/on-call pager number located in [place].

## 2021-05-11 NOTE — Progress Notes (Signed)
MRI order placed by MD.  Attempted to contact EEG for device removal for MRI.  No answer x 2.  Will inform oncoming RN and MD.

## 2021-05-11 NOTE — Progress Notes (Addendum)
Neurology progress note  S: Patient remains very altered, not following commands, although she did open eyes to physical stimulation today. No events c/f seizure since 05/08/21 overnight.  Data:  LTM EEG 1/6-05/10/21  ABNORMALITY - Continuous slow, generalized - Breach artifact, left temporal region     IMPRESSION: This study is suggestive of cortical dysfunction arising from left temporal region consistent with underlying craniotomy. There is also severe diffuse encephalopathy, nonspecific etiology.   Event button was pressed on 05/08/2021 at 1939. During the event patient was laying in bed had left ar  jerking which then progressed to semi rhythmic jerking of bilateral upper extremities. Concomitant EEG didn't show any EEG changes suggest seizure. However focal motor seizure amy not be seen on scalp eeg. Therefore clinical correlation is recommended.  O:  Vitals:   05/11/21 0200 05/11/21 0400  BP: (!) 183/77   Pulse: 82   Resp: 16   Temp:  98.6 F (37 C)  SpO2: 100%    Gen: stuporous, briefly opens eyes to noxious stimuli but does not attend examiner, does not follow commands CV: RRR Resp: CTAB  Neurologic exam Gen: obtunded, briefly opens eyes to vigorous sternal rub but does not attend examiner, does not follow commands Speech: no intelligible speech CN: PERRL, (+) corneals, oculocephalics, gag, face symmetric at rest Motor: moves all extremities spontaneously but not to command Sensory: withdraws to noxious stimuli in all extremities Reflexes: 2+ L side, 3+ R side, toes mute bilat Coordination, gait: 52  71 yo F with seizure secondary to chronic left subdural hematoma who presents with unresponsiveness in setting of having previously weaned brivaracetam.  My suspicion is that this did represent breakthrough status epilepticus.  She improved following administration of ativan. She had an event 1/6 overnight that was not associated with clear EEG change but semiology on video  was felt to be c/w seizure. She missed her vimpat dose that night during transfer so Ireloaded her on vimpat and continued her on 100mg  bid.  - Continue vimpat 100mg  bid - Continue LTM EEG - Seizure precautions - Will continue to follow  Su Monks, MD [facility] [phone]  If 7pm- 7am, please page neurology on call as listed in [place].

## 2021-05-11 NOTE — Progress Notes (Signed)
Initial Nutrition Assessment  DOCUMENTATION CODES:   Severe malnutrition in context of chronic illness  INTERVENTION:   Initiate tube feeding via cortrak tube: Osmolite 1.5 at 20 ml/h and increase by 10 ml every 8 hours to goal rate of 40 ml/hr (960 ml per day) Prosource TF 45 ml BID  Provides 1520 kcal, 82 gm protein, 729 ml free water daily  Rena-vit daily per tube  Monitor magnesium and phosphorus every 12 hours x 4 occurrences, MD to replete as needed, as pt is at risk for refeeding syndrome given severe malnutrition, although per family pt has been eating with a good appetite PTA.   NUTRITION DIAGNOSIS:   Severe Malnutrition related to chronic illness as evidenced by severe fat depletion, severe muscle depletion.  GOAL:   Patient will meet greater than or equal to 90% of their needs  MONITOR:   TF tolerance, Diet advancement  REASON FOR ASSESSMENT:   Consult Enteral/tube feeding initiation and management  ASSESSMENT:   Pt with PMH of anxiety, GERD, HTN, SDH 2022, seizures, CAD, ischemic cardiomyopathy, ESRD on HD MWF, chronic pleural effusion admitted 1/6 from SNF with acute metabolic encephalopathy,  seizures, and acute on chronic pleural effusion large left and moderate right likely from volume overload.   Pt discussed during ICU rounds and with RN.  Spoke with pt's daughter and husband who are at bedside. Pt in bed with eyes open but did not respond to questions.  Pt has been declining since 12/21 when she was admitted with a hip fracture. She then developed c.diff, and had also been at [facility] for a SDH which she had a crani and a cortrak tube. She had previously been on PD but when she needed rehab no facilities could accommodate so she had to go back to HD.  Pt during her c.diff got down to 102 lb but per daughter was able to regain weight to 115 lb.  Pt currently weighed 116 lb, unsure if this is a stated weight.  Per family pt has a good appetite, is able to  feed herself, and is eating 3 meals per day. They report that the facility has good food and choices that she enjoys. She had not had trouble chewing or swallowing.  Breakfast: 1/2 everything bagel and cream cheese, juice Lunch: ham sandwich or soup; facility packs pt a lunch for HD days but family states that she does not eat it always Supper: pizza, sandwich, varies based on facility Per daughter pt is a 2+ person assist from the bed to a wheelchair. She has been unable to regain her ability to walk. BLE with severe muscle loss. Pt has on boots which I did not remove, expect calves are severely depleted as well.   EDW per renal: 56 kg  She is currently 3 kg below her EDW.   Medications reviewed and include: protonix  10 mEq KCl x 2   Labs reviewed: Na 131, K 3.3  UF: 3000 ml  I&O: -5.1 L   NUTRITION - FOCUSED PHYSICAL EXAM:  Flowsheet Row Most Recent Value  Orbital Region Moderate depletion  Upper Arm Region Severe depletion  Thoracic and Lumbar Region Severe depletion  Buccal Region Moderate depletion  Temple Region Severe depletion  Clavicle Bone Region Moderate depletion  Clavicle and Acromion Bone Region Severe depletion  Scapular Bone Region Severe depletion  Dorsal Hand Unable to assess  [edematous]  Patellar Region Severe depletion  Anterior Thigh Region Severe depletion  Posterior Calf Region Unable to assess  [  boots]  Edema (RD Assessment) Moderate  [BUE]  Hair Reviewed  Eyes Reviewed  Mouth Unable to assess  Skin Reviewed  Nails Reviewed  [painted]       Diet Order:   Diet Order             Diet NPO time specified  Diet effective now                   EDUCATION NEEDS:   No education needs have been identified at this time  Skin:  Skin Assessment: Reviewed RN Assessment (scabbed areas below nose from shingles)  Last BM:  1/7 type 6; large  Height:   Ht Readings from Last 1 Encounters:  05/08/21 5\' 4"  (1.626 m)    Weight:   Wt Readings  from Last 1 Encounters:  05/10/21 53 kg    Ideal Body Weight:  54.5 kg  BMI:  Body mass index is 20.06 kg/m.  Estimated Nutritional Needs:   Kcal:  1500-1700  Protein:  75-90 grams  Fluid:  1.2 L/day  Lockie Pares., RD, LDN, CNSC See AMiON for contact information

## 2021-05-11 NOTE — Progress Notes (Addendum)
Patient ID: [name], female   DOB: 09-09-1950, 71 y.o.   MRN: [id number]      Progress Note from the Palliative Medicine Team at [facility]   Patient Name: Amy [name]        Date: 05/11/2021 DOB: 1950-09-30  Age: 71 y.o. MRN#: [id number] Attending Physician: Marshell Garfinkel, MD Primary Care Physician: Murlean Iba, MD Admit Date: 05/08/2021   Medical records reviewed   71 year old woman with history of subdural hematoma and prior seizures, hypertension, GERD, anxiety, end-stage renal disease on HD, cardiomyopathy, coronary artery disease.  Patient is a resident of [facility] skilled nursing facility.  To the emergency room for decreased responsiveness.  Evaluated by neurology at [facility] who had concerns regarding EEG related to ongoing seizures.  Patient transferred to [facility] for further work-up and stabilization.  Initial palliative medicine consult 05/10/2021 see consult note  Currently patient with eyes open, unable to follow commands, appears to track daughter's voice.  Patient is frail and cachectic  Consult for placement of core track for artificial feeding today. 24 hour EEG in progress.   This NP visited patient at the bedside as a follow up to  yesterday's [place], for palliative medicine needs and emotional support response.  Husband and daughter at bedside.  Family tell me "she is doing better today", they are hopeful for continued improvement.      Patient is high risk for further decompensation secondary to multiple comorbidities; end-stage renal disease/dialysis dependent, chronic left subdural hematoma and overall failure to thrive.  Education offered on the importance of continued conversation with each other  and the medical providers regarding overall plan of care and treatment options,  ensuring decisions are within the context of the patients values and GOCs.  Questions and concerns addressed   Discussed with bedside RN   PMT will continue to support  holistically    Wadie Lessen NP  Palliative Medicine Team Team Phone # [phone] Pager [phone]

## 2021-05-11 NOTE — Progress Notes (Signed)
EEG maintenance performed. No skin break down noted. Test button was tested.

## 2021-05-11 NOTE — Progress Notes (Signed)
NAME:  Amy [name], MRN:  [id number], DOB:  [birth year], LOS: 3 ADMISSION DATE:  05/08/2021, CONSULTATION DATE:  05/08/21 REFERRING MD:  [facility], CHIEF COMPLAINT:  AMS   History of Present Illness:  71 year old woman with hx of subdural hematoma and prior seizures who presents from SNF with decreased responsiveness.  Evaluated by neurology at [facility] who evaluated a spot EEG that raised concern for ongoing seizures so transferred to [facility] for formal management.  History per chart review as patient is encephalopathic.  Other workup in ER revealed slightly high INR, new large left pleural effusion.  Pertinent  Medical History  Anxiety GERD HTN Hyponatremia in 2018 attributed to HCTZ Subdural 2022 CAD with prior DES Ischemic cardiomyopathy ESRD on HD Malfunctioning tunneled catheter on warfarin?  Cardiologist note seems confused by this too  Significant Hospital Events: Including procedures, antibiotic start and stop dates in addition to other pertinent events   1/6 ER admit 1/7 Off nicardipine drip  Interim History / Subjective:  No acute events overnight. On HD.   Objective   Blood pressure (!) 146/75, pulse 93, temperature 98.3 F (36.8 C), temperature source Axillary, resp. rate 19, weight 53 kg, SpO2 99 %.        Intake/Output Summary (Last 24 hours) at 05/11/2021 0854 Last data filed at 05/11/2021 0000 Gross per 24 hour  Intake 76.39 ml  Output 3000 ml  Net -2923.61 ml    Filed Weights   05/10/21 0900  Weight: 53 kg    Examination: General:  elderly appearing female in NAD Neuro:  Eyes open spontaneously. No speech, no command following.  HEENT:  Ruby/AT, No JVD noted, PERRL Cardiovascular:  RRR, no MRG Lungs:  Clear bilateral breath sounds Abdomen:  Soft, non-distended, non-tender.  Musculoskeletal:  No acute deformity Skin:  Intact, MMM   Resolved Hospital Problem list   N/a  Assessment & Plan:   Acute metabolic encephalopathy r/o status epilepticus Subdural  hematoma ~9 mo ago P: - Continue Vimpat 100mg  BID,  EEG monitoring per neuro - Neuro checks  Hypertensive urgency vs. Emergency on cardene drip Hx of CAD w/ prior DES Hx of ischemic cardiomyopathy P: - per neuro; SBP goal 140-180 - Off nicardipine currently  - IV Lopressor scheduled  Acute on chronic pleural effusion: large left and moderate right. Likely from volume overload. P: - Defer chest tube placement for now and see how the effusions do with HD. INR has improved with vitamin K should she need tube.  - Volume removal with dialysis  ESRD on HD dialysis P: - Dialysis per nephrology  Hyponatremia: hx of hyponatremia P: - Follow labs  Supratherapeutic INR: Warfarin use for HD catheter patency.  Per family she is on low-dose Coumadin to prevent catheter clotting P: - Holding Coumadin  Leukocytosis P: - Follow cultures  Hx of anxiety P: - Holding home meds  GERD P: - PPI  Anemia: likely chronic due to ESRD P: - Follow CBC  HLD P: -  Holding statin  Severe malnutrition, present on admission - Unclear she will be able to take PO in the near future - Will pursue Cortrak placement and TF if OK with family  Thyroid nodules - OP follow-up pending clinical course.   Goals of care - DNR, palliative care following. Appreciate their assistance. Will need to determine of OK with tube feeds.   Best Practice (right click and "Reselect all SmartList Selections" daily)   Diet/type: NPO DVT prophylaxis: SCD GI prophylaxis: PPI  Lines: N/A Foley:  N/A Code Status:  DNR Last date of multidisciplinary goals of care discussion [1/7 husband and daughter at bedside; they state that Mrs. Foresta is a DNR and would not want to be intubated.]  Critical care time:     Georgann Housekeeper, AGACNP-BC [place] for personal pager PCCM on call pager [phone] until 7pm. Please call Elink 7p-7a. [phone]  05/11/2021 8:59  AM

## 2021-05-11 NOTE — Procedures (Addendum)
Patient Name: Amy [name]  MRN: [id number]  Epilepsy Attending: [name]  Referring Physician/Provider: Dr Su Monks Duration: 05/10/2021 1651 to 05/11/2021 1651   Patient history: 71 year old female presenting with unresponsiveness that aborted with Ativan. EEG to evaluate for seizure   Level of alertness:  awake, asleep    AEDs during EEG study: LCM   Technical aspects: This EEG study was done with scalp electrodes positioned according to the 10-20 International system of electrode placement. Electrical activity was acquired at a sampling rate of 500Hz  and reviewed with a high frequency filter of 70Hz  and a low frequency filter of 1Hz . EEG data were recorded continuously and digitally stored.    Description: During awake state, no clear posterior dominant rhythm was seen. Sleep was characterized by sleep spindles (12 to 14 Hz), maximal frontocentral region.  EEG showed continuous generalized and maximal left temporal predominantly 5 to 9 Hz theta-alpha activity admixed 2-3hz  delta slowing. Spikes were noted in left posterior temporal region. Hyperventilation and photic stimulation were not performed.      ABNORMALITY - Continuous slow, generalized and maximal left temporal region - Spike, left posterior temporal region    IMPRESSION: This study showed evidence of epileptogenicity arising from left posterior temporal region. Additionally, study is suggestive of cortical dysfunction arising from left temporal region likely due to underlying craniotomy. There is also moderate diffuse encephalopathy, nonspecific etiology. No seizure were seen during this study.   Amy [name] Amy [name]

## 2021-05-11 NOTE — Progress Notes (Signed)
[facility] Progress Note   Subjective:   pt seen in ICU bed, just finished HD.  Pt is not responsive.   Objective Vitals:   05/11/21 1115 05/11/21 1130 05/11/21 1200 05/11/21 1230  BP: (!) 151/72 (!) 144/73 (!) 154/68 (!) 153/75  Pulse: 86 88 86 82  Resp: 20 (!) 21 20 18   Temp:   98.1 F (36.7 C)   TempSrc:   Axillary   SpO2: 100% 99% 95% 96%  Weight:       Physical Exam General:chronically ill appearing female, eyes open staring ahead, did grip once on R side, then would not grip again, no L grip, nonverbal  Heart:RRR, no mrg Lungs: mostly CTAB anterolaterally, BS decreased, nml WOB on RA Abdomen:soft, NTND Extremities: 1+ edema in b/l UE, no LE edema Dialysis Access: TDC      CXR 1/07, 1/09 > large L > R pleural effusions   CT chest 1/06 - large L > R pleural effusions, no edema  OP HD: MWF [facility]  56kg   400/ 500  R IJ perm cath  Assessment/Plan:  Altered mental status/unresponsiveness - etiology unclear. Concern for seizure activity/status epilepticus, on IV lamictal.  Per neuro/ pmd.   ESRD - HD MWF. Had HD here yesterday 1/08 and getting HD again today to get back on schedule.   Hypertension - Blood pressure elevated. SP cardene gtt, stopped this am. Per neuro SBP goal 140-180.  Home BP meds held.  Volume - Acute on chronic pleural effusion noted.  Plans for possible chest tube.  3 L off w/ HD 1/08, 3kg under pre HD today. 2.5 L off today w/ HD. Mild edema on exam.   Anemia of CKD - Hgb 10.0.  Hold ESA for now and monitor labs.    Secondary Hyperparathyroidism -  Calcium in goal.  Not on binders as outpatient.   Nutrition - currently NPO Leukocytosis - checking UA and cultures. Per PMD Hx SDH s/p craniotomy  Shingles - lesions crusted over.  Was on Valtrex prior to admission.  Kelly Splinter, MD 05/11/2021, 1:34 PM       Basic Metabolic Panel: Recent Labs  Lab 05/09/21 0447 05/10/21 0858 05/11/21 0456  NA 129* 133* 131*  K 4.0  3.7 3.3*  CL 97* 99 96*  CO2 21* 20* 24  GLUCOSE 94 103* 78  BUN 35* 41* 17  CREATININE 3.95* 4.68* 2.73*  CALCIUM 8.9 9.0 8.7*  PHOS  --  4.8* 4.3     CBC: Recent Labs  Lab 05/08/21 0919 05/09/21 0557 05/10/21 0858 05/11/21 0456  WBC 11.3* 16.8* 22.8* 15.6*  NEUTROABS 8.9*  --   --   --   HGB 10.9* 10.0* 9.8* 10.4*  HCT 33.0* 31.0* 29.8* 31.4*  MCV 91.7 91.2 91.4 91.8  PLT 241 281 307 213    Medications:  sodium chloride     sodium chloride     feeding supplement (OSMOLITE 1.5 CAL)     lacosamide (VIMPAT) IV 100 mg (05/11/21 1129)   potassium chloride 10 mEq (05/11/21 1233)    chlorhexidine  15 mL Mouth Rinse BID   Chlorhexidine Gluconate Cloth  6 each Topical Daily   Chlorhexidine Gluconate Cloth  6 each Topical Q0600   feeding supplement (PROSource TF)  45 mL Per Tube BID   mouth rinse  15 mL Mouth Rinse q12n4p   metoprolol tartrate  5 mg Intravenous Q12H   pantoprazole (PROTONIX) IV  40 mg Intravenous QHS

## 2021-05-12 DIAGNOSIS — R569 Unspecified convulsions: Secondary | ICD-10-CM | POA: Diagnosis not present

## 2021-05-12 LAB — URINE CULTURE: Culture: >=100000 — AB

## 2021-05-12 LAB — BASIC METABOLIC PANEL
Anion gap: 9 (ref 5–15)
BUN: 27 mg/dL — ABNORMAL HIGH (ref 8–23)
CO2: 25 mmol/L (ref 22–32)
Calcium: 8.6 mg/dL — ABNORMAL LOW (ref 8.9–10.3)
Chloride: 99 mmol/L (ref 98–111)
Creatinine, Ser: 2.23 mg/dL — ABNORMAL HIGH (ref 0.44–1.00)
GFR, Estimated: 23 mL/min — ABNORMAL LOW (ref >60)
Glucose, Bld: 116 mg/dL — ABNORMAL HIGH (ref 70–99)
Potassium: 3.5 mmol/L (ref 3.5–5.1)
Sodium: 133 mmol/L — ABNORMAL LOW (ref 135–145)

## 2021-05-12 LAB — GLUCOSE, CAPILLARY
Glucose-Capillary: 113 mg/dL — ABNORMAL HIGH (ref 70–99)
Glucose-Capillary: 114 mg/dL — ABNORMAL HIGH (ref 70–99)
Glucose-Capillary: 118 mg/dL — ABNORMAL HIGH (ref 70–99)
Glucose-Capillary: 129 mg/dL — ABNORMAL HIGH (ref 70–99)
Glucose-Capillary: 131 mg/dL — ABNORMAL HIGH (ref 70–99)
Glucose-Capillary: 134 mg/dL — ABNORMAL HIGH (ref 70–99)
Glucose-Capillary: 150 mg/dL — ABNORMAL HIGH (ref 70–99)

## 2021-05-12 LAB — MAGNESIUM
Magnesium: 1.9 mg/dL (ref 1.7–2.4)
Magnesium: 1.9 mg/dL (ref 1.7–2.4)

## 2021-05-12 LAB — PHOSPHORUS
Phosphorus: 3.5 mg/dL (ref 2.5–4.6)
Phosphorus: 3.4 mg/dL (ref 2.5–4.6)

## 2021-05-12 MED ORDER — METOPROLOL TARTRATE 12.5 MG HALF TABLET
12.5000 mg | ORAL_TABLET | Freq: Two times a day (BID) | ORAL | Status: DC
  Administered 2021-05-12 – 2021-05-17 (×9): 12.5 mg
  Filled 2021-05-12 (×9): qty 1

## 2021-05-12 MED ORDER — AMOXICILLIN 250 MG/5ML PO SUSR
500.0000 mg | Freq: Every day | ORAL | Status: AC
Start: 2021-05-12 — End: 2021-05-16
  Administered 2021-05-12 – 2021-05-16 (×5): 500 mg
  Filled 2021-05-12 (×7): qty 10

## 2021-05-12 MED ORDER — PANTOPRAZOLE 2 MG/ML SUSPENSION
40.0000 mg | Freq: Every day | ORAL | Status: DC
  Administered 2021-05-12 – 2021-05-16 (×5): 40 mg
  Filled 2021-05-12 (×6): qty 20

## 2021-05-12 MED ORDER — ACETAMINOPHEN 160 MG/5ML PO SOLN
650.0000 mg | Freq: Four times a day (QID) | ORAL | Status: DC | PRN
  Administered 2021-05-12: 650 mg
  Filled 2021-05-12: qty 20.3

## 2021-05-12 NOTE — Progress Notes (Signed)
EEG maintenance performed.  No skin breakdown observed at electrode sites Fz, F8, A1.

## 2021-05-12 NOTE — Progress Notes (Addendum)
Amy [name] Progress Note   Subjective:   pt seen in ICU bed.  Pt is not responding again today.  Objective Vitals:   05/12/21 1200 05/12/21 1300 05/12/21 1400 05/12/21 1505  BP: (!) 142/68 (!) 141/66 (!) 145/71 (!) 154/69  Pulse: 81 83 82 86  Resp: 16 18 16 18   Temp: 98.7 F (37.1 C)     TempSrc: Oral     SpO2: 96% 97% 96% 98%  Weight:       Physical Exam General:chronically ill appearing female, staring ahead and nonverbal  Heart:RRR, no mrg Lungs: mostly CTAB anterolaterally, BS decreased, nml WOB on RA Abdomen:soft, NTND Extremities: 1+ edema in b/l UE, no LE edema Dialysis Access: TDC      CXR 1/07, 1/09 > large L > R pleural effusions   CT chest 1/06 - large L > R pleural effusions, no edema  OP HD: MWF [facility]  56kg   400/ 500  R IJ perm cath  Assessment/Plan:  Altered mental status/unresponsiveness - etiology unclear. Concern for seizure activity/status epilepticus, on IV lamictal.  Per neuro/ pmd.   ESRD - HD MWF. HD Wed on schedule in ICU.   Hypertension - Blood pressure elevated. SP cardene gtt. Per neuro SBP goal 140-180.  Home BP meds held.  Volume - Acute on chronic pleural effusion noted. 5.5 L UF after HD x 2 here. Is now well below dry wt by 10kg. CXR unchanged bilat effusions. Now w/ minimal edema on exam. Has lost sig body wt.   Anemia of CKD - Hgb 10.0.  Hold ESA for now and monitor labs.    Secondary Hyperparathyroidism -  Calcium in goal.  Not on binders as outpatient.   Nutrition - currently NPO Leukocytosis - checking UA and cultures. Per PMD Hx SDH s/p craniotomy  Shingles - lesions crusted over.  Was on Valtrex prior to admission.  Kelly Splinter, MD 05/12/2021, 4:13 PM       Basic Metabolic Panel: Recent Labs  Lab 05/10/21 0858 05/11/21 0456 05/11/21 1656 05/12/21 0611  NA 133* 131*  --  133*  K 3.7 3.3*  --  3.5  CL 99 96*  --  99  CO2 20* 24  --  25  GLUCOSE 103* 78  --  116*  BUN 41* 17  --  27*   CREATININE 4.68* 2.73*  --  2.23*  CALCIUM 9.0 8.7*  --  8.6*  PHOS 4.8* 4.3 3.3 3.5     CBC: Recent Labs  Lab 05/08/21 0919 05/09/21 0557 05/10/21 0858 05/11/21 0456  WBC 11.3* 16.8* 22.8* 15.6*  NEUTROABS 8.9*  --   --   --   HGB 10.9* 10.0* 9.8* 10.4*  HCT 33.0* 31.0* 29.8* 31.4*  MCV 91.7 91.2 91.4 91.8  PLT 241 281 307 213    Medications:  sodium chloride     sodium chloride     feeding supplement (OSMOLITE 1.5 CAL) 40 mL/hr at 05/12/21 0819   lacosamide (VIMPAT) IV Stopped (05/12/21 1030)    amoxicillin  500 mg Per Tube QHS   chlorhexidine  15 mL Mouth Rinse BID   Chlorhexidine Gluconate Cloth  6 each Topical Daily   Chlorhexidine Gluconate Cloth  6 each Topical Q0600   feeding supplement (PROSource TF)  45 mL Per Tube BID   mouth rinse  15 mL Mouth Rinse q12n4p   metoprolol tartrate  12.5 mg Per Tube BID   multivitamin  1 tablet Per Tube QHS  pantoprazole sodium  40 mg Per Tube QHS   valproic acid  500 mg Per Tube BID

## 2021-05-12 NOTE — TOC Initial Note (Addendum)
Transition of Care [facility]) - Initial/Assessment Note    Patient Details  Name: Amy [name] MRN: [id number] Date of Birth: February 07, 1951  Transition [facility] Care [facility]) CM/SW Contact:    Bethann Berkshire, [place] Phone Number: 05/12/2021, 2:41 PM  Clinical Narrative:                  Attempted to meet with pt to confirm she is from [facility]. Pt responds using nods. Pt nod's yes confirming she is from [facility].   CSW called and left message with [facility]; left message requesting return call.   1447: CSW received call back from [facility]; confirmed pt is from their SNF for long term care and would be able to return once medically stable. TOC will continue to follow to assist with discharging pending medical stability.   Expected Discharge Plan: Skilled Nursing Facility Barriers to Discharge: Continued Medical Work up   Patient Goals and CMS Choice        Expected Discharge Plan and Services Expected Discharge Plan: [place] arrangements for the past 2 months: [place]                                      Prior Living Arrangements/Services Living arrangements for the past 2 months: [place] Lives with:: Facility Resident                   Activities of Daily Living      Permission Sought/Granted                  Emotional Assessment       Orientation: : Oriented to Self, Oriented to Place, Oriented to  Time, Oriented to Situation Alcohol / Substance Use: Not Applicable Psych Involvement: No (comment)  Admission diagnosis:  Seizure ([place]) [R56.9] Patient Active Problem List   Diagnosis Date Noted   Acute metabolic encephalopathy    ESRD (end stage renal disease) on dialysis ([place])    Secondary hypertension    Acute cystitis 12/23/2020   Acute cystitis with hematuria    Anasarca    Syncope, vasovagal 41/93/7902   Complication associated with dialysis catheter     Pulmonary edema 11/07/2020   CAD (coronary artery disease) of artery bypass graft 09/15/2020   History of nausea and vomiting 09/15/2020   Leukocytosis    Sleep disturbance    SDH (subdural hematoma) 08/07/2020   Acute on chronic anemia    Subdural hemorrhage following injury 08/04/2020   Orthostasis    Gastroparesis    Chronic systolic CHF (congestive heart failure) (HCC)    Nausea    Hypoalbuminemia due to protein-calorie malnutrition (HCC)    Nausea and vomiting    Chronic combined systolic and diastolic CHF (congestive heart failure) (HCC)    Anemia of chronic disease    Seizure disorder ([place])    Chronic pain syndrome    Debility 07/04/2020   Ischemic cardiomyopathy    Protein-calorie malnutrition, severe 06/23/2020   Non-ST elevation (NSTEMI) myocardial infarction ([place])    Pressure injury of skin 06/21/2020   C. difficile diarrhea 06/19/2020   C. difficile colitis 06/19/2020   Seizure ([place]) 06/19/2020   Gastroenteritis due to COVID-19 virus 05/29/2020   Surgery, elective    S/P ORIF (open reduction internal fixation) fracture    Closed right  hip fracture, initial encounter ([place]) 04/22/2020   Hypotension 12/16/2019   ESRD on dialysis [facility]) 12/16/2019   Depression with anxiety 12/16/2019   Hypomagnesemia 12/16/2019   Atrial flutter (HCC)    Atrial fibrillation with RVR ([place]) 04/03/2019   Benign essential HTN 04/03/2019   HLD (hyperlipidemia) 04/03/2019   ESRD (end stage renal disease) ([place]) 12/13/2018   Itching 11/08/2018   Hyperparathyroidism, secondary renal ([place]) 09/13/2017   Ground glass opacity present on imaging of lung 04/08/2017   Osteoarthritis of knee 12/29/2016   Normocytic anemia 10/14/2016   Swelling of limb    Chronic headaches    Gastritis    History of adenomatous polyp of colon 09/07/2016   Chronic abdominal pain 09/05/2016   Iron deficiency anemia 09/05/2016   Hyponatremia 09/05/2016   Multiple thyroid nodules 07/04/2015   Right thyroid nodule  06/27/2015   Allergic rhinitis, seasonal 12/03/2014   Fever blister 12/03/2014   Herniated lumbar intervertebral disc 12/03/2014   Hypokalemia 12/03/2014   Recurrent sinus infections 12/03/2014   Insomnia 11/05/2014   Acquired scoliosis 06/26/2014   Intervertebral disc stenosis of neural canal of lumbar region 06/20/2014   Lumbar facet arthropathy 05/21/2014   Degenerative spondylolisthesis 03/12/2014   OP (osteoporosis) 08/10/2013   Idiopathic peripheral neuropathy 03/24/2009   Edema 12/27/2007   Headache, tension-type 10/25/2006   Primary hypertension 10/24/2006   Anxiety 05/03/1998   Esophageal reflux 05/03/1998   PCP:  Murlean Iba, MD Pharmacy:   [id number] [address], [place] AT [address] [place] [address] Phone: [phone] Fax: [phone]  [id number] [place], [place] [address] [place] [address] Phone: [phone] Fax: [phone]  [id number] - [place], [place] [address] [place] [address] Phone: [phone] Fax: [place], [address] AT [place] [address] Phone: [phone] Fax: [phone]  [id number] - [place], [place] AT [place] [address] Phone: [phone] Fax: [phone]  [facility] [address] [place] [address] Phone: [phone] Fax: [phone]     Social Determinants of Health (SDOH) Interventions    Readmission Risk Interventions Readmission Risk Prevention Plan 12/25/2020 11/09/2020 06/24/2020  Transportation Screening Complete Complete Complete  PCP or Specialist Appt within 3-5 Days - - Complete   HRI or [place] - - Complete  Social Work Consult for [place] Planning/Counseling - - Complete  Palliative Care Screening - - Not Applicable  Medication Review Press photographer) Complete Complete Complete  PCP or Specialist appointment within 3-5 days of discharge Complete Complete -  [place] or Home Care Consult - Complete -  SW Recovery Care/Counseling Consult Complete Complete -  Palliative Care Screening Not Applicable Not Applicable -  Skilled Nursing Facility Complete Complete -  Some recent data might be hidden

## 2021-05-12 NOTE — Progress Notes (Signed)
Patient alert and oriented x 4 on 3L O2 via [place].  RN assisted patient to eat approximately 75% of her dinner (pot roast, mashed potatoes and gravy, and sherbet ice cream) and 480 cc of PO fluids (ginger ale and water).   TF still running.  Patient remained upright for 30 mins post -eating.  No issues or concerns noted.

## 2021-05-12 NOTE — Procedures (Addendum)
Patient Name: Amy [name]  MRN: [id number]  Epilepsy Attending: [name]  Referring Physician/Provider: Dr Su Monks Duration: 05/11/2021 1651 to 05/12/2021 1148   Patient history: 71 year old female presenting with unresponsiveness that aborted with Ativan. EEG to evaluate for seizure   Level of alertness:  awake, asleep    AEDs during EEG study: LCM   Technical aspects: This EEG study was done with scalp electrodes positioned according to the 10-20 International system of electrode placement. Electrical activity was acquired at a sampling rate of 500Hz  and reviewed with a high frequency filter of 70Hz  and a low frequency filter of 1Hz . EEG data were recorded continuously and digitally stored.    Description: During awake state, no clear posterior dominant rhythm was seen. Sleep was characterized by sleep spindles (12 to 14 Hz), maximal frontocentral region.  EEG showed continuous generalized and maximal left temporal predominantly 5 to 9 Hz theta-alpha activity admixed 2-3hz  delta slowing. Spikes were noted in left posterior temporal region. Hyperventilation and photic stimulation were not performed.      ABNORMALITY - Continuous slow, generalized and maximal left temporal region - Spike, left posterior temporal region    IMPRESSION: This study showed evidence of epileptogenicity arising from left posterior temporal region. Additionally, study is suggestive of cortical dysfunction arising from left temporal region likely due to underlying craniotomy. There is also moderate diffuse encephalopathy, nonspecific etiology. No seizure were seen during this study.  Markeith Jue [place] Sarks

## 2021-05-12 NOTE — Evaluation (Signed)
Clinical/Bedside Swallow Evaluation Patient Details  Name: Amy [name] MRN: [id number] Date of Birth: [birth year]  Today's Date: 05/12/2021 Time: SLP Start Time (ACUTE ONLY): 1500 SLP Stop Time (ACUTE ONLY): 1525 SLP Time Calculation (min) (ACUTE ONLY): 25 min  Past Medical History:  Past Medical History:  Diagnosis Date   Anemia    a. 08/2016 Guaiac + stool. EGD w/ gastritis.   Anxiety    Closed fracture of shaft of left ulna 11/27/2018   Colon polyp    a. 08/2016 Colonoscopy.   ESRD on peritoneal dialysis ([place])    Gastritis    a. 08/2016 EGD: Gastritis-->PPI.   GERD (gastroesophageal reflux disease)    History of chicken pox    History of measles as a child    Hypertension    Hyponatremia    a. 08/2016 in setting of HCTZ Rx.   Leukocytosis    Mesenteric lymphadenopathy 04/08/2017   Multiple thyroid nodules    Past Surgical History:  Past Surgical History:  Procedure Laterality Date   ABDOMINAL HYSTERECTOMY  2003   due to heavy periods and clotting during menses   BREAST LUMPECTOMY Left 1990   CHOLECYSTECTOMY  2010   COLONOSCOPY WITH PROPOFOL N/A 09/07/2016   Procedure: COLONOSCOPY WITH PROPOFOL;  Surgeon: Lucilla Lame, MD;  Location: [facility] ENDOSCOPY;  Service: Endoscopy;  Laterality: N/A;   CORONARY STENT INTERVENTION N/A 06/27/2020   Procedure: CORONARY STENT INTERVENTION;  Surgeon: Sherren Mocha, MD;  Location: [place] CV LAB;  Service: Cardiovascular;  Laterality: N/A;   DIALYSIS/PERMA CATHETER INSERTION N/A 11/16/2018   Procedure: DIALYSIS/PERMA CATHETER INSERTION;  Surgeon: Algernon Huxley, MD;  Location: [place] [facility];  Service: Cardiovascular;  Laterality: N/A;   DIALYSIS/PERMA CATHETER INSERTION N/A 11/10/2020   Procedure: DIALYSIS/PERMA CATHETER EXCHANGE;  Surgeon: Algernon Huxley, MD;  Location: [place] CV LAB;  Service: Cardiovascular;  Laterality: N/A;   DIALYSIS/PERMA CATHETER INSERTION N/A 12/24/2020   Procedure: DIALYSIS/PERMA CATHETER INSERTION /  EXCHANGE;  Surgeon: Algernon Huxley, MD;  Location: [place] CV LAB;  Service: Cardiovascular;  Laterality: N/A;   DIALYSIS/PERMA CATHETER INSERTION N/A 01/19/2021   Procedure: DIALYSIS/PERMA CATHETER INSERTION;  Surgeon: Algernon Huxley, MD;  Location: [place] CV LAB;  Service: Cardiovascular;  Laterality: N/A;   DIALYSIS/PERMA CATHETER REMOVAL N/A 08/16/2019   Procedure: DIALYSIS/PERMA CATHETER REMOVAL;  Surgeon: Algernon Huxley, MD;  Location: [place] CV LAB;  Service: Cardiovascular;  Laterality: N/A;   DILATION AND CURETTAGE OF UTERUS  1985   ESOPHAGOGASTRODUODENOSCOPY (EGD) WITH PROPOFOL N/A 09/07/2016   Procedure: ESOPHAGOGASTRODUODENOSCOPY (EGD) WITH PROPOFOL;  Surgeon: Lucilla Lame, MD;  Location: [facility] END[facility];  Service: Endoscopy;  Laterality: N/A;   GALLBLADDER SURGERY  2012   INTRAMEDULLARY (IM) NAIL INTERTROCHANTERIC Right 04/23/2020   Procedure: INTRAMEDULLARY (IM) NAIL INTERTROCHANTRIC;  Surgeon: Thornton Park, MD;  Location: [facility];  Service: Orthopedics;  Laterality: Right;   INTRAVASCULAR IMAGING/OCT N/A 06/27/2020   Procedure: INTRAVASCULAR IMAGING/OCT;  Surgeon: Sherren Mocha, MD;  Location: [place] [facility];  Service: Cardiovascular;  Laterality: N/A;   IR FLUORO GUIDE CV LINE RIGHT  09/04/2020   IR US GUIDE VASC ACCESS RIGHT  09/04/2020   KNEE SURGERY     Dr. Tamala Julian   LEFT HEART CATH AND CORONARY ANGIOGRAPHY N/A 06/25/2020   Procedure: LEFT HEART CATH AND CORONARY ANGIOGRAPHY;  Surgeon: Troy Sine, MD;  Location: [place] CV LAB;  Service: Cardiovascular;  Laterality: N/A;   TONSILLECTOMY     TONSILLECTOMY AND ADENOIDECTOMY  1970   UPPER GI ENDOSCOPY  03/02/2006   H. Pylori negative; Dr. Allen Norris   HPI:  71yo female admitted 05/08/21 to [facility] for [place]. Sz activity in ED. Transferred to [facility]. PMH: ESRD on HD MWF, anxiety, HFrEF, CAD s/p LAD, acute on chronic SDH s/p p(artial craniotomy (3/22), seizures, depression, HTN, GERD. Dx Shingles @ SNF 05/08/21. MRI = small  acute to early subacute small vessel type infarct, Multiple scattered chronic micro hemorrhages about the cerebellum and brainstem. CXR = no new cardiopulmonary abnormality. CIR 4/5-5/16/23 for cognitive treatment    Assessment / Plan / Recommendation  Clinical Impression  Pt seen at bedside for assessment of swallow function and safety, and to identify least restrictive diet. Pt was awake and alert, cooperative throughout evaluation. Oral care was completed with suction. Pt presents with generalized oral motor weakness, weak volitional cough. She tolerated trials of ice chips, puree, solid textures, and thin liquids. Cough noted x1 following presentation of cracker then water. No other cough response elicited despite multiple presentations. Given pt's significant weakness and poor endurance, recommend beginning a conservative diet of dys 2 (finely chopped) solids and thin liquids. Meds whole with liquid. Safe swallow precautions posted at [facility]. SLP will follow to assess diet tolerance and determine appropriateness for advanced consistencies. RN and MD informed.  SLP Visit Diagnosis: Dysphagia, unspecified (R13.10)    Aspiration Risk  Mild aspiration risk;Risk for inadequate nutrition/hydration    Diet Recommendation Dysphagia 2 (Fine chop);Thin liquid   Medication Administration: Whole meds with liquid Supervision: Full supervision/cueing for compensatory strategies Compensations: Minimize environmental distractions;Slow rate;Small sips/bites Postural Changes: Seated upright at 90 degrees;Remain upright for at least 30 minutes after po intake    Other  Recommendations Other Recommendations: Have oral suction available    Recommendations for follow up therapy are one component of a multi-disciplinary discharge planning process, led by the attending physician.  Recommendations may be updated based on patient status, additional functional criteria and insurance authorization.  Follow up  Recommendations Other (comment) (TBD)      Assistance Recommended at Discharge Frequent or constant Supervision/Assistance  Functional Status Assessment Patient has had a recent decline in their functional status and/or demonstrates limited ability to make significant improvements in function in a reasonable and predictable amount of time  Frequency and Duration min 1 x/week  1 week;2 weeks       Prognosis Prognosis for Safe Diet Advancement: Fair Barriers to Reach Goals: Other (Comment) (deconditioning)      Swallow Study   General Date of Onset: 05/08/21 HPI: 71yo female admitted 05/08/21 to [facility] for [place]. Sz activity in ED. Transferred to [facility]. PMH: ESRD on HD MWF, anxiety, HFrEF, CAD s/p LAD, acute on chronic SDH s/p p(artial craniotomy (3/22), seizures, depression, HTN, GERD. Dx Shingles @ SNF 05/08/21. MRI = small acute to early subacute small vessel type infarct, Multiple scattered chronic micro hemorrhages about the cerebellum and brainstem. CXR = no new cardiopulmonary abnormality. CIR 4/5-5/16/23 for cognitive treatment Type of Study: Bedside Swallow Evaluation Previous Swallow Assessment: none Diet Prior to this Study: NPO Temperature Spikes Noted: No Respiratory Status: Nasal cannula History of Recent Intubation: No Behavior/Cognition: Alert;Cooperative;Pleasant mood Oral Cavity Assessment: Within Functional Limits Oral Care Completed by SLP: Yes Oral Cavity - Dentition: Adequate natural dentition Vision: Functional for self-feeding Self-Feeding Abilities: Total assist Patient Positioning: Upright in bed Baseline Vocal Quality: Low vocal intensity Volitional Cough: Weak Volitional Swallow: Able to elicit    Oral/Motor/Sensory Function Overall Oral Motor/Sensory Function: Generalized oral  weakness   Ice Chips Ice chips: Within functional limits Presentation: Spoon   Thin Liquid Thin Liquid: Within functional limits Presentation: Straw    Nectar Thick Nectar Thick  Liquid: Not tested   Honey Thick Honey Thick Liquid: Not tested   Puree Puree: Within functional limits Presentation: Spoon   Solid     Solid: Within functional limits     Amy [name] B. Quentin Ore, [facility], [place] Speech Language Pathologist Office: [phone]  Shonna Chock 05/12/2021,3:32 PM

## 2021-05-12 NOTE — Progress Notes (Signed)
LTM EEG discontinued - no skin breakdown at unhook.   

## 2021-05-12 NOTE — Progress Notes (Signed)
Subjective: No clinical seizures overnight.  Patient is awake, communicating today.  Patient family at bedside states she looks significantly better.  ROS: Unable to obtain due to poor mental status  Examination  Vital signs in last 24 hours: Temp:  [97.8 F (36.6 C)-99.7 F (37.6 C)] 98 F (36.7 C) (01/10 0800) Pulse Rate:  [73-96] 75 (01/10 0800) Resp:  [11-24] 14 (01/10 0800) BP: (91-187)/(60-84) 187/83 (01/10 0921) SpO2:  [95 %-100 %] 97 % (01/10 0800) Weight:  [46.3 kg] 46.3 kg (01/10 0823)  General: lying in bed, cachetic looking CVS: pulse-normal rate and rhythm RS: breathing comfortably, coarse breath sounds BL Extremities: warm, no edema Neuro: Awake, alert, oriented to self, able to recognize her family, able to follow simple one-step commands, PERRLA, appears to have right-sided neglect, flattening of right nasolabial fold, antigravity strength in all extremities with right hemiparesis    Basic Metabolic Panel: Recent Labs  Lab 05/08/21 0919 05/08/21 1701 05/09/21 0447 05/10/21 0858 05/11/21 0456 05/11/21 1656 05/12/21 0611  NA 129*  --  129* 133* 131*  --  133*  K 3.5  --  4.0 3.7 3.3*  --  3.5  CL 97*  --  97* 99 96*  --  99  CO2 26  --  21* 20* 24  --  25  GLUCOSE 102*  --  94 103* 78  --  116*  BUN 32*  --  35* 41* 17  --  27*  CREATININE 3.39*  --  3.95* 4.68* 2.73*  --  2.23*  CALCIUM 8.7*  --  8.9 9.0 8.7*  --  8.6*  MG  --  1.7 1.8  --  1.8 1.8 1.9  PHOS  --   --   --  4.8* 4.3 3.3 3.5    CBC: Recent Labs  Lab 05/08/21 0919 05/09/21 0557 05/10/21 0858 05/11/21 0456  WBC 11.3* 16.8* 22.8* 15.6*  NEUTROABS 8.9*  --   --   --   HGB 10.9* 10.0* 9.8* 10.4*  HCT 33.0* 31.0* 29.8* 31.4*  MCV 91.7 91.2 91.4 91.8  PLT 241 281 307 213     Coagulation Studies: Recent Labs    05/10/21 0653 05/11/21 0456  LABPROT 47.8* 17.6*  INR 5.2* 1.5*    Imaging MRI brain without contrast 05/12/2021: 1. Single punctate focus of diffusion abnormality  involving the subcortical white matter of the anterior right frontal lobe, likely a small acute to early subacute small vessel type infarct. No associated hemorrhage or mass effect. 2. No other acute intracranial abnormality. 3. Postoperative changes from prior left craniotomy for subdural evacuation. Underlying postoperative dural thickening with residual 4 mm chronic subdural hygroma without significant mass effect. 4. Left temporal lobe encephalomalacia of with advanced chronic microvascular ischemic disease. 5. Multiple scattered chronic micro hemorrhages about the cerebellum and brainstem, most likely related to chronic poorly controlled hypertension.    ASSESSMENT AND PLAN: 71 year old female with seizures secondary to chronic left subdural hematoma who presented with unresponsiveness in the setting of previously weaned brivaracetam.    Epilepsy with breakthrough seizure Chronic subdural hematoma Vancomycin-resistant Enterococcus faecalis UTI ( VRE) Right frontal ischemic stroke, incidental -Patient had incidental stroke on MRI which is not contributing to current presentation.   Recommendations -Continue Vimpat 100 mg twice daily and Depakote 500 mg twice daily -We will check Depakote level -With significant improvement in mental status, it is unlikely that patient has HSV meningitis.  Okay to DC acyclovir -Continue seizure precautions -PRN IV Ativan 2  mg for clinical seizure-like activity - Management of rest of comorbidities per primary team - Discussed plan with Dr Lynetta Mare and family at bedside  I have spent a total of 36  minutes with the patient reviewing hospital notes,  test results, labs and examining the patient as well as establishing an assessment and plan that was discussed personally with the patient, family at bedside and ICU team.  > 50% of time was spent in direct patient care.   Zeb Comfort Epilepsy [facility] For questions after 5pm  please refer to AMION to reach the Neurologist on call

## 2021-05-12 NOTE — Progress Notes (Signed)
NAME:  Amy [name], MRN:  [id number], DOB:  01/13/1951, LOS: 4 ADMISSION DATE:  05/08/2021, CONSULTATION DATE:  05/08/21 REFERRING MD:  [facility], CHIEF COMPLAINT:  AMS   History of Present Illness:  71 year old woman with hx of subdural hematoma and prior seizures who presents from SNF with decreased responsiveness.  Evaluated by neurology at [facility] who evaluated a spot EEG that raised concern for ongoing seizures so transferred to [facility] for formal management.  History per chart review as patient is encephalopathic.  Other workup in ER revealed slightly high INR, new large left pleural effusion.  Pertinent  Medical History  Anxiety GERD HTN Hyponatremia in 2018 attributed to HCTZ Subdural 2022 CAD with prior DES Ischemic cardiomyopathy ESRD on HD Malfunctioning tunneled catheter on warfarin?  Cardiologist note seems confused by this too  Significant Hospital Events: Including procedures, antibiotic start and stop dates in addition to other pertinent events   1/6 ER admit 1/7 Off nicardipine drip 1/9 Cortrak, TF started 1/10 more awake  Interim History / Subjective:  No acute events overnight  Objective   Blood pressure (!) 142/68, pulse 81, temperature 98.7 F (37.1 C), temperature source Oral, resp. rate 16, weight 46.3 kg, SpO2 96 %.        Intake/Output Summary (Last 24 hours) at 05/12/2021 1239 Last data filed at 05/12/2021 0700 Gross per 24 hour  Intake 687.96 ml  Output 0 ml  Net 687.96 ml    Filed Weights   05/10/21 0900 05/12/21 0823  Weight: 53 kg 46.3 kg    Examination: General:  elderly female resting comfortably Neuro:  Somnolent. Awake to voice. Oriented to self. Follows simple commands.  HEENT:  [place]/AT, No JVD noted, PERRL Cardiovascular:  RRR, no MRG Lungs:  Clear bilateral breath sounds Abdomen:  Soft, non-distended, non-tender.  Musculoskeletal:  No acute deformity Skin:  Intact, MMM  Imaging reviewed personally: MRI brain 1/10. Single punctate focus  of diffusion abnormality involving the subcortical white matter of the anterior right frontal lobe, likely a small acute to early subacute small vessel type infarct.  Resolved Hospital Problem list   Hypertensive urgency vs. Emergency on cardene drip  Assessment & Plan:   Status epilepticus Chronic subdural hematoma ~9 mo ago P: - Continue Vimpat depakote   - EEG monitoring discontinued - Neuro checks per unit protocol  Hx of CAD w/ prior DES Hx of ischemic cardiomyopathy P: - per neuro; SBP goal 140-180 - metoprolol via tube - PRN hydralazine  Acute on chronic pleural effusion: large left and moderate right. Have been very slowly progressive since the time of her SDH and functional decline. Likely from volume overload +/- nutritional deficiencies. P: - Hopefully this improves with HD volume removal.  - She is quite frail, my bias is not to pursue any additional procedures at this time for drainage.  - Check CXR in the morning. She is asymptomatic.  ESRD on HD dialysis P: - Dialysis per nephrology  Hyponatremia: hx of hyponatremia P: - Follow labs  Supratherapeutic INR: Warfarin use for HD catheter patency.  Per family she is on low-dose Coumadin to prevent catheter clotting. P: - Restart home warfarin.   Acute cystitis: present on admission. Cultures growing VRE. Patient had complained of dysuria to her daughter.  P: - Amoxicillin per tube - Follow cultures  Severe malnutrition, present on admission - Tube feeds via cortrak - SLP eval  Thyroid nodules - OP follow-up pending clinical course.   Goals of care -  DNR, palliative care following. Appreciate their assistance.   Best Practice (right click and "Reselect all SmartList Selections" daily)   Diet/type: NPO DVT prophylaxis: SCD GI prophylaxis: PPI Lines: N/A Foley:  N/A Code Status:  DNR Last date of multidisciplinary goals of care discussion [1/7 husband and daughter at bedside; they state that Mrs.  Schlueter is a DNR and would not want to be intubated.]  Critical care time:     Georgann Housekeeper, AGACNP-BC [place] for personal pager PCCM on call pager [phone] until 7pm. Please call Elink 7p-7a. [phone]  05/12/2021 12:39 PM

## 2021-05-13 ENCOUNTER — Inpatient Hospital Stay (HOSPITAL_COMMUNITY): Payer: Medicare Other

## 2021-05-13 DIAGNOSIS — R569 Unspecified convulsions: Secondary | ICD-10-CM | POA: Diagnosis not present

## 2021-05-13 LAB — CBC
HCT: 31.3 % — ABNORMAL LOW (ref 36.0–46.0)
HCT: 35.4 % — ABNORMAL LOW (ref 36.0–46.0)
Hemoglobin: 10.4 g/dL — ABNORMAL LOW (ref 12.0–15.0)
Hemoglobin: 11.3 g/dL — ABNORMAL LOW (ref 12.0–15.0)
MCH: 30.5 pg (ref 26.0–34.0)
MCH: 29.7 pg (ref 26.0–34.0)
MCHC: 33.2 g/dL (ref 30.0–36.0)
MCHC: 31.9 g/dL (ref 30.0–36.0)
MCV: 91.8 fL (ref 80.0–100.0)
MCV: 93.2 fL (ref 80.0–100.0)
Platelets: 241 10*3/uL (ref 150–400)
Platelets: 256 10*3/uL (ref 150–400)
RBC: 3.41 MIL/uL — ABNORMAL LOW (ref 3.87–5.11)
RBC: 3.8 MIL/uL — ABNORMAL LOW (ref 3.87–5.11)
RDW: 15.9 % — ABNORMAL HIGH (ref 11.5–15.5)
RDW: 15.9 % — ABNORMAL HIGH (ref 11.5–15.5)
WBC: 12.2 10*3/uL — ABNORMAL HIGH (ref 4.0–10.5)
WBC: 14.5 10*3/uL — ABNORMAL HIGH (ref 4.0–10.5)
nRBC: 0 % (ref 0.0–0.2)
nRBC: 0 % (ref 0.0–0.2)

## 2021-05-13 LAB — GLUCOSE, CAPILLARY
Glucose-Capillary: 118 mg/dL — ABNORMAL HIGH (ref 70–99)
Glucose-Capillary: 115 mg/dL — ABNORMAL HIGH (ref 70–99)
Glucose-Capillary: 133 mg/dL — ABNORMAL HIGH (ref 70–99)
Glucose-Capillary: 113 mg/dL — ABNORMAL HIGH (ref 70–99)

## 2021-05-13 LAB — MAGNESIUM: Magnesium: 1.8 mg/dL (ref 1.7–2.4)

## 2021-05-13 LAB — BASIC METABOLIC PANEL
Anion gap: 10 (ref 5–15)
BUN: 52 mg/dL — ABNORMAL HIGH (ref 8–23)
CO2: 23 mmol/L (ref 22–32)
Calcium: 8.7 mg/dL — ABNORMAL LOW (ref 8.9–10.3)
Chloride: 95 mmol/L — ABNORMAL LOW (ref 98–111)
Creatinine, Ser: 3.08 mg/dL — ABNORMAL HIGH (ref 0.44–1.00)
GFR, Estimated: 16 mL/min — ABNORMAL LOW (ref >60)
Glucose, Bld: 118 mg/dL — ABNORMAL HIGH (ref 70–99)
Potassium: 4.1 mmol/L (ref 3.5–5.1)
Sodium: 128 mmol/L — ABNORMAL LOW (ref 135–145)

## 2021-05-13 LAB — VALPROIC ACID LEVEL: Valproic Acid Lvl: 31 ug/mL — ABNORMAL LOW (ref 50.0–100.0)

## 2021-05-13 LAB — PHOSPHORUS: Phosphorus: 3.4 mg/dL (ref 2.5–4.6)

## 2021-05-13 MED ORDER — LOSARTAN POTASSIUM 50 MG PO TABS
50.0000 mg | ORAL_TABLET | Freq: Every day | ORAL | Status: DC
  Administered 2021-05-14 – 2021-05-17 (×4): 50 mg via ORAL
  Filled 2021-05-13 (×4): qty 1

## 2021-05-13 MED ORDER — ALTEPLASE 2 MG IJ SOLR: 2.0000 mg | Freq: Once | INTRAMUSCULAR | Status: DC | PRN

## 2021-05-13 MED ORDER — NEPRO/CARBSTEADY PO LIQD
237.0000 mL | Freq: Two times a day (BID) | ORAL | Status: DC
  Administered 2021-05-14: 237 mL via ORAL

## 2021-05-13 MED ORDER — WARFARIN - PHYSICIAN DOSING INPATIENT: Freq: Every day | Status: DC

## 2021-05-13 MED ORDER — SERTRALINE HCL 50 MG PO TABS
50.0000 mg | ORAL_TABLET | Freq: Every day | ORAL | Status: DC
  Administered 2021-05-14 – 2021-05-17 (×4): 50 mg via ORAL
  Filled 2021-05-13 (×4): qty 1

## 2021-05-13 MED ORDER — LACOSAMIDE 50 MG PO TABS
100.0000 mg | ORAL_TABLET | Freq: Two times a day (BID) | ORAL | Status: DC
  Administered 2021-05-13 – 2021-05-17 (×8): 100 mg
  Filled 2021-05-13 (×8): qty 2

## 2021-05-13 MED ORDER — SODIUM CHLORIDE 0.9 % IV SOLN: 100.0000 mL | INTRAVENOUS | Status: DC | PRN

## 2021-05-13 MED ORDER — WARFARIN SODIUM 1 MG PO TABS
1.0000 mg | ORAL_TABLET | Freq: Every day | ORAL | Status: DC
  Administered 2021-05-13 – 2021-05-16 (×4): 1 mg via ORAL
  Filled 2021-05-13 (×5): qty 1

## 2021-05-13 MED ORDER — ATORVASTATIN CALCIUM 40 MG PO TABS
40.0000 mg | ORAL_TABLET | Freq: Every day | ORAL | Status: DC
  Administered 2021-05-14 – 2021-05-17 (×4): 40 mg via ORAL
  Filled 2021-05-13 (×4): qty 1

## 2021-05-13 MED ORDER — EZETIMIBE 10 MG PO TABS
10.0000 mg | ORAL_TABLET | Freq: Every day | ORAL | Status: DC
  Administered 2021-05-14 – 2021-05-17 (×4): 10 mg via ORAL
  Filled 2021-05-13 (×4): qty 1

## 2021-05-13 MED ORDER — LIDOCAINE-PRILOCAINE 2.5-2.5 % EX CREA: 1.0000 "application " | TOPICAL_CREAM | CUTANEOUS | Status: DC | PRN

## 2021-05-13 MED ORDER — LIDOCAINE HCL (PF) 1 % IJ SOLN: 5.0000 mL | INTRAMUSCULAR | Status: DC | PRN

## 2021-05-13 MED ORDER — HEPARIN SODIUM (PORCINE) 1000 UNIT/ML DIALYSIS
1000.0000 [IU] | INTRAMUSCULAR | Status: DC | PRN
  Administered 2021-05-13: 1000 [IU] via INTRAVENOUS_CENTRAL
  Filled 2021-05-13: qty 1

## 2021-05-13 MED ORDER — PENTAFLUOROPROP-TETRAFLUOROETH EX AERO: 1.0000 "application " | INHALATION_SPRAY | CUTANEOUS | Status: DC | PRN

## 2021-05-13 NOTE — Progress Notes (Signed)
Subjective: No acute events overnight.  Patient was able to pass her swallow screen.  ROS: Unable to obtain due to poor mental status  Examination  Vital signs in last 24 hours: Temp:  [98.5 F (36.9 C)-99.9 F (37.7 C)] 98.5 F (36.9 C) (01/11 0800) Pulse Rate:  [81-106] 96 (01/11 0930) Resp:  [14-24] 19 (01/11 0930) BP: (131-191)/(60-104) 173/104 (01/11 0930) SpO2:  [93 %-99 %] 97 % (01/11 0930)  General: lying in bed, cachetic looking CVS: pulse-normal rate and rhythm RS: breathing comfortably, coarse breath sounds BL Extremities: warm, no edema Neuro: Awake, alert, oriented to self, able to recognize her family, able to follow simple one-step commands, PERRLA,  antigravity strength in bilateral upper extremities and 2/5 in bilateral lower extremities with right hemiparesis  Basic Metabolic Panel: Recent Labs  Lab 05/09/21 0447 05/09/21 0447 05/10/21 0858 05/11/21 0456 05/11/21 1656 05/12/21 0611 05/12/21 1619 05/13/21 0600  NA 129*  --  133* 131*  --  133*  --  128*  K 4.0  --  3.7 3.3*  --  3.5  --  4.1  CL 97*  --  99 96*  --  99  --  95*  CO2 21*  --  20* 24  --  25  --  23  GLUCOSE 94  --  103* 78  --  116*  --  118*  BUN 35*  --  41* 17  --  27*  --  52*  CREATININE 3.95*  --  4.68* 2.73*  --  2.23*  --  3.08*  CALCIUM 8.9  --  9.0 8.7*  --  8.6*  --  8.7*  MG 1.8  --   --  1.8 1.8 1.9 1.9 1.8  PHOS  --    < > 4.8* 4.3 3.3 3.5 3.4 3.4   < > = values in this interval not displayed.    CBC: Recent Labs  Lab 05/08/21 0919 05/09/21 0557 05/10/21 0858 05/11/21 0456 05/13/21 0600  WBC 11.3* 16.8* 22.8* 15.6* 12.2*  NEUTROABS 8.9*  --   --   --   --   HGB 10.9* 10.0* 9.8* 10.4* 10.4*  HCT 33.0* 31.0* 29.8* 31.4* 31.3*  MCV 91.7 91.2 91.4 91.8 91.8  PLT 241 281 307 213 241     Coagulation Studies: Recent Labs    05/11/21 0456  LABPROT 17.6*  INR 1.5*    Imaging No new brain imaging overnight  ASSESSMENT AND PLAN: 71 year old female with  seizures secondary to chronic left subdural hematoma who presented with unresponsiveness in the setting of previously weaned brivaracetam.    Epilepsy with breakthrough seizure Chronic subdural hematoma Vancomycin-resistant Enterococcus faecalis UTI ( VRE) Right frontal ischemic stroke, incidental -Patient had incidental stroke on MRI which is not contributing to current presentation.   Recommendations -Continue Vimpat 100 mg twice daily and Depakote 500 mg twice daily -Continue seizure precautions -PRN IV Ativan 2 mg for clinical seizure-like activity - Management of rest of comorbidities per primary team -Follow-up with neurology and 8 to 12 weeks after discharge  Thank you for allowing [place] to participate in the care of this patient.  Neurology will follow peripherally.  Please call us for any further questions.   I have spent a total of 26 minutes with the patient reviewing hospital notes,  test results, labs and examining the patient as well as establishing an assessment and plan that was discussed personally with the patient, family at bedside and ICU team.  >  50% of time was spent in direct patient care.   Zeb Comfort Epilepsy [facility] For questions after 5pm please refer to AMION to reach the Neurologist on call

## 2021-05-13 NOTE — Progress Notes (Signed)
Patient ID: [name], female   DOB: [birth year], 71 y.o.   MRN: [id number]      Progress Note from the Palliative Medicine Team at [facility]   Patient Name: Amy [name]        Date: 05/13/2021 DOB: 1950/09/08  Age: 71 y.o. MRN#: [id number] Attending Physician: Kipp Brood, MD Primary Care Physician: Murlean Iba, MD Admit Date: 05/08/2021   Medical records reviewed   71 year old woman with history of subdural hematoma and prior seizures, hypertension, GERD, anxiety, end-stage renal disease on HD, cardiomyopathy, coronary artery disease.  Patient is a resident of [facility] skilled nursing facility.  To the emergency room for decreased responsiveness.  Evaluated by neurology at [facility] who had concerns regarding EEG related to ongoing seizures.  Patient transferred to [facility] for further work-up and stabilization.  Initial palliative medicine consult 05/10/2021 see consult note  This NP visited patient at the bedside as a follow up to  yesterday's [place], for palliative medicine needs and emotional support response.  Husband and daughter at bedside.  Patient is showing signs of progress.  Currently she is working with speech therapy .  Her eyes are open and she is speaking a few words.     Patient is frail and cachectic and high risk for decompensation.    Patient is high risk for further decompensation secondary to multiple comorbidities; end-stage renal disease/dialysis dependent, chronic left subdural hematoma and overall failure to thrive.  Family express how positive they are feeling seeing her progress.  Education offered on the importance of continued conversation with each other  and the medical providers regarding overall plan of care and treatment options,  ensuring decisions are within the context of the patients values and GOCs.  Questions and concerns addressed   Discussed with bedside RN and speech therapist  PMT will continue to support holistically    Wadie Lessen NP  Palliative Medicine Team Team Phone # [phone] Pager [phone]

## 2021-05-13 NOTE — Progress Notes (Signed)
[facility] Progress Note   Subjective:   pt seen in ICU bed.  Pt is responding today verbally and is mostly oriented.  Objective Vitals:   05/13/21 0800 05/13/21 0900 05/13/21 0930 05/13/21 1000  BP: (!) 178/86 (!) 184/92 (!) 173/104 (!) 184/88  Pulse: 93 93 96 96  Resp: 16 16 19 17   Temp: 98.5 F (36.9 C)     TempSrc: Axillary     SpO2: 97% 98% 97% 96%  Weight:       Physical Exam General:chronically ill , female, alert and responsive today Heart:RRR, no mrg Lungs: mostly CTAB anterolaterally, BS decreased, nml WOB on RA Abdomen:soft, NTND Extremities: 1+ edema in b/l UE, no LE edema Dialysis Access: TDC      CXR 1/07, 1/09 > large L > R pleural effusions   CT chest 1/06 - large L > R pleural effusions, no edema  OP HD: MWF [facility]  56kg   400/ 500  R IJ perm cath  Assessment/Plan: Status epilecticus/ chronic SDH - improved, getting vimpat/ depakote through NG tube now. More alert today and responding. Has orders for tx to progressive unit.   ESRD - HD MWF. HD today upstairs.   Hypertension - Blood pressure elevated. SP cardene gtt. Metoprolol and losartan restarted per NG. No sig vol excess.   Volume - Acute on chronic pleural effusion noted. 5.5 L UF after HD x 2 here. Is now well below dry wt by 10kg. Now w/ minimal edema on exam. Has lost sig body wt.  CXR unchanged bilat effusions. Cont to lower vol as tolerated by BP  Anemia of CKD - Hgb 10.0.  Hold ESA for now and monitor labs.    Secondary Hyperparathyroidism -  Calcium in goal.  Not on binders as outpatient.   Nutrition - currently NPO  CAD/ hx prior DES/ hx ICM Leukocytosis - checking UA and cultures. Per PMD Hx SDH s/p craniotomy  Shingles - lesions crusted over.  Was on Valtrex prior to admission.  Kelly Splinter, MD 05/13/2021, 10:18 AM       Basic Metabolic Panel: Recent Labs  Lab 05/11/21 0456 05/11/21 1656 05/12/21 0611 05/12/21 1619 05/13/21 0600  NA 131*  --   133*  --  128*  K 3.3*  --  3.5  --  4.1  CL 96*  --  99  --  95*  CO2 24  --  25  --  23  GLUCOSE 78  --  116*  --  118*  BUN 17  --  27*  --  52*  CREATININE 2.73*  --  2.23*  --  3.08*  CALCIUM 8.7*  --  8.6*  --  8.7*  PHOS 4.3   < > 3.5 3.4 3.4   < > = values in this interval not displayed.     CBC: Recent Labs  Lab 05/08/21 0919 05/09/21 0557 05/10/21 0858 05/11/21 0456 05/13/21 0600  WBC 11.3* 16.8* 22.8* 15.6* 12.2*  NEUTROABS 8.9*  --   --   --   --   HGB 10.9* 10.0* 9.8* 10.4* 10.4*  HCT 33.0* 31.0* 29.8* 31.4* 31.3*  MCV 91.7 91.2 91.4 91.8 91.8  PLT 241 281 307 213 241    Medications:  sodium chloride     sodium chloride     feeding supplement (OSMOLITE 1.5 CAL) 1,000 mL (05/12/21 1515)   lacosamide (VIMPAT) IV Stopped (05/12/21 2159)    amoxicillin  500 mg Per Tube  QHS   atorvastatin  40 mg Oral Daily   chlorhexidine  15 mL Mouth Rinse BID   Chlorhexidine Gluconate Cloth  6 each Topical Daily   Chlorhexidine Gluconate Cloth  6 each Topical Q0600   ezetimibe  10 mg Oral Daily   feeding supplement (PROSource TF)  45 mL Per Tube BID   lacosamide  100 mg Per Tube BID   losartan  50 mg Oral Daily   mouth rinse  15 mL Mouth Rinse q12n4p   metoprolol tartrate  12.5 mg Per Tube BID   multivitamin  1 tablet Per Tube QHS   pantoprazole sodium  40 mg Per Tube QHS   sertraline  50 mg Oral Daily   valproic acid  500 mg Per Tube BID   warfarin  1 mg Oral q1600   Warfarin - Physician Dosing Inpatient   Does not apply [id number]

## 2021-05-13 NOTE — Progress Notes (Signed)
Pt receives out-pt HD at [facility] on MWF. Pt arrives around 9:10 for 9:30 chair time. Will assist as needed.    Melven Sartorius Renal Navigator [phone]

## 2021-05-13 NOTE — Consult Note (Signed)
° °  [facility] Inpatient Consult   05/13/2021  Amy [name] 28-Jun-1950 [id number]  [facility] [employer] [ACO] Patient: Medicare   Primary Care Provider:  Murlean Iba, MD  *Patient is LTC SNF at [facility] per inpatient [facility] team; patient's LOC is Progressive  Patient screened for hospitalization with noted extreme high risk score for unplanned readmission risk and to assess for potential [place] [facility] service needs for post hospital transition.  Review of patient's medical record reveals patient is from a LTC level of care and needs are to be met at that level of care.   Plan:  Will sign off as patient's post hospital care is to be met at a LTC facility and no community care management needs identified.    For questions contact:   Natividad Brood, RN BSN [facility] Liaison  [phone] business mobile phone Toll free office [phone]  Fax number: [phone] [email or web address]

## 2021-05-13 NOTE — Progress Notes (Signed)
NAME:  Amy [name], MRN:  [id number], DOB:  02/07/1951, LOS: 5 ADMISSION DATE:  05/08/2021, CONSULTATION DATE:  05/08/21 REFERRING MD:  [facility], CHIEF COMPLAINT:  AMS   History of Present Illness:  71 year old woman with hx of subdural hematoma and prior seizures who presents from SNF with decreased responsiveness.  Evaluated by neurology at [facility] who evaluated a spot EEG that raised concern for ongoing seizures so transferred to [facility] for formal management.  History per chart review as patient is encephalopathic.  Other workup in ER revealed slightly high INR, new large left pleural effusion.  Pertinent  Medical History  Anxiety GERD HTN Hyponatremia in 2018 attributed to HCTZ Subdural 2022 CAD with prior DES Ischemic cardiomyopathy ESRD on HD Malfunctioning tunneled catheter on warfarin?  Cardiologist note seems confused by this too  Significant Hospital Events: Including procedures, antibiotic start and stop dates in addition to other pertinent events   1/6 ER admit 1/7 Off nicardipine drip 1/9 Cortrak, TF started 1/10 more awake  Interim History / Subjective:  Diet started yesterday. Eating and drinking well. More alert although this seems to wax and wane.   Objective   Blood pressure (!) 178/86, pulse 93, temperature 98.5 F (36.9 C), temperature source Axillary, resp. rate 16, weight 46.3 kg, SpO2 97 %.        Intake/Output Summary (Last 24 hours) at 05/13/2021 0905 Last data filed at 05/13/2021 0800 Gross per 24 hour  Intake 1296.83 ml  Output 0 ml  Net 1296.83 ml    Filed Weights   05/10/21 0900 05/12/21 0823  Weight: 53 kg 46.3 kg    Examination: General:  frail elderly female in bed, no distress. Chronically ill appearing.  Neuro:  Spontaneously awake, alert, oriented x 3.  HEENT:  [place]/AT, PERRL, no JVD Cardiovascular:  RRR, no MRG Lungs:  Clear, no distress on RA.  Abdomen:  Soft, non-tender, non-distended Musculoskeletal:  no acute deformity.  Skin:   Intact, MMM  Imaging reviewed personally: MRI brain 1/10. Single punctate focus of diffusion abnormality involving the subcortical white matter of the anterior right frontal lobe, likely a small acute to early subacute small vessel type infarct.  Resolved Hospital Problem list   Hypertensive urgency vs. Emergency on cardene drip  Assessment & Plan:   Status epilepticus Chronic subdural hematoma ~9 mo ago P: - Continue Vimpat depakote per tube - EEG monitoring discontinued - Neuro checks per unit protocol  Hx of CAD w/ prior DES Hx of ischemic cardiomyopathy P: - per neuro; SBP goal 140-180 - metoprolol via tube - restart losartan, zetia, statin. Can restart home hydralazine if BP remains above goal.  - PRN hydralazine  Acute on chronic pleural effusion: large left and moderate right. Have been very slowly progressive since the time of her SDH and functional decline. Likely from volume overload +/- nutritional deficiencies. P: - She is quite frail, my bias is not to pursue any additional procedures at this time for drainage unless it would become clinically indicated. Currently in no distress on minimal O2.   ESRD on HD dialysis MWF P: - Dialysis per nephrology  Hyponatremia: hx of hyponatremia P: - Follow labs  Supratherapeutic INR: 5.2 on admission. Warfarin use for HD catheter patency.  Per family she is on low-dose Coumadin to prevent catheter clotting. P: - Restart home warfarin 1mg  daily.  - Intermittent INR  Acute cystitis: present on admission. Cultures growing VRE. Patient had complained of dysuria to her daughter in the  days leading to her presentation.  P: - Amoxicillin per tube for 5 total days through 1/14  Severe malnutrition, present on admission - Tube feeds via cortrak. If she is getting adequate dietary intake and is able to swallow pills we can remove cortrak, otherwise make sense to keep it for now.  - SLP eval ongoing  Thyroid nodules - OP  follow-up pending clinical course.   Goals of care - DNR, palliative care following. Appreciate their assistance.   Best Practice (right click and "Reselect all SmartList Selections" daily)   Diet/type: dysphagia diet (see orders) DVT prophylaxis: SCD GI prophylaxis: PPI Lines: N/A Foley:  N/A Code Status:  DNR Last date of multidisciplinary goals of care discussion [1/7 husband and daughter at bedside; they state that Mrs. Hoefer is a DNR and would not want to be intubated.]  Critical care time:    Family updated at bedside 1/10  Georgann Housekeeper, AGACNP-BC [place] for personal pager PCCM on call pager [phone] until 7pm. Please call Elink 7p-7a. [phone]  05/13/2021 9:05 AM

## 2021-05-13 NOTE — Progress Notes (Signed)
Speech Language Pathology Treatment: Dysphagia  Patient Details Name: Amy [name] MRN: [id number] DOB: 03-29-1951 Today's Date: 05/13/2021 Time: 5366-4403 SLP Time Calculation (min) (ACUTE ONLY): 15 min  Assessment / Plan / Recommendation Clinical Impression  Pt seen at bedside for follow up after BSE completed 05/12/21. Pt's husband and daughter were present during this session. RN reports pt ate well at dinner last PM, with no difficulty reported. Pt was seen this morning with Dys2/thin breakfast items. Pt was awake and alert, but seemed fatigued - frequent yawning noted. Minimal PO intake accepted with extended oral prep of solid textures. No overt s/s aspiration observed on solids or liquids. If pt continues to have minimal intake due to extended oral prep, diet may need to be downgraded to puree/thin for energy conservation to maximize safety. Additionally, Cortrak use may in fact be decreasing appetite in this pt who is at risk for not meeting nutrition/hydration needs.    HPI HPI: 71yo female admitted 05/08/21 to [facility] for [place]. Sz activity in ED. Transferred to [facility]. PMH: ESRD on HD MWF, anxiety, HFrEF, CAD s/p LAD, acute on chronic SDH s/p p(artial craniotomy (3/22), seizures, depression, HTN, GERD. Dx Shingles @ SNF 05/08/21. MRI = small acute to early subacute small vessel type infarct, Multiple scattered chronic micro hemorrhages about the cerebellum and brainstem. CXR = no new cardiopulmonary abnormality. CIR 4/5-5/16/23 for cognitive treatment      SLP Plan  Continue with current plan of care      Recommendations for follow up therapy are one component of a multi-disciplinary discharge planning process, led by the attending physician.  Recommendations may be updated based on patient status, additional functional criteria and insurance authorization.    Recommendations  Diet recommendations: Dysphagia 2 (fine chop);Thin liquid Liquids provided via: Straw Medication Administration:  Crushed with puree Supervision: Full supervision/cueing for compensatory strategies;Trained caregiver to feed patient Compensations: Minimize environmental distractions;Slow rate;Small sips/bites Postural Changes and/or Swallow Maneuvers: Seated upright 90 degrees;Upright 30-60 min after meal                Oral Care Recommendations: Oral care BID Follow Up Recommendations: Other (comment) (TBD) Assistance recommended at discharge: Frequent or constant Supervision/Assistance SLP Visit Diagnosis: Dysphagia, unspecified (R13.10) Plan: Continue with current plan of care          Fujiko Picazo B. Quentin Ore, [facility], [place] Speech Language Pathologist Office: [phone]  Shonna Chock  05/13/2021, 11:01 AM

## 2021-05-13 NOTE — Progress Notes (Signed)
PT Cancellation Note  Patient Details Name: Amy [name] MRN: [id number] DOB: 25-Jun-1950   Cancelled Treatment:    Reason Eval/Treat Not Completed: Patient at procedure or test/unavailable Patient off unit at HD. Will attempt as time allows.   Yaffa Seckman A. Gilford Rile PT, DPT Acute Rehabilitation Services Pager [phone] Office [phone]    Linna Hoff 05/13/2021, 1:52 PM

## 2021-05-14 DIAGNOSIS — N186 End stage renal disease: Secondary | ICD-10-CM

## 2021-05-14 LAB — BASIC METABOLIC PANEL
Anion gap: 8 (ref 5–15)
BUN: 31 mg/dL — ABNORMAL HIGH (ref 8–23)
CO2: 27 mmol/L (ref 22–32)
Calcium: 8.9 mg/dL (ref 8.9–10.3)
Chloride: 99 mmol/L (ref 98–111)
Creatinine, Ser: 1.93 mg/dL — ABNORMAL HIGH (ref 0.44–1.00)
GFR, Estimated: 27 mL/min — ABNORMAL LOW (ref >60)
Glucose, Bld: 111 mg/dL — ABNORMAL HIGH (ref 70–99)
Potassium: 4.7 mmol/L (ref 3.5–5.1)
Sodium: 134 mmol/L — ABNORMAL LOW (ref 135–145)

## 2021-05-14 LAB — CBC
HCT: 30.5 % — ABNORMAL LOW (ref 36.0–46.0)
Hemoglobin: 9.9 g/dL — ABNORMAL LOW (ref 12.0–15.0)
MCH: 30.2 pg (ref 26.0–34.0)
MCHC: 32.5 g/dL (ref 30.0–36.0)
MCV: 93 fL (ref 80.0–100.0)
Platelets: 227 10*3/uL (ref 150–400)
RBC: 3.28 MIL/uL — ABNORMAL LOW (ref 3.87–5.11)
RDW: 15.8 % — ABNORMAL HIGH (ref 11.5–15.5)
WBC: 7.9 10*3/uL (ref 4.0–10.5)
nRBC: 0 % (ref 0.0–0.2)

## 2021-05-14 LAB — PHOSPHORUS: Phosphorus: 2.5 mg/dL (ref 2.5–4.6)

## 2021-05-14 LAB — PROTIME-INR
INR: 1.5 — ABNORMAL HIGH (ref 0.8–1.2)
Prothrombin Time: 17.7 seconds — ABNORMAL HIGH (ref 11.4–15.2)

## 2021-05-14 LAB — GLUCOSE, CAPILLARY
Glucose-Capillary: 113 mg/dL — ABNORMAL HIGH (ref 70–99)
Glucose-Capillary: 114 mg/dL — ABNORMAL HIGH (ref 70–99)
Glucose-Capillary: 118 mg/dL — ABNORMAL HIGH (ref 70–99)
Glucose-Capillary: 120 mg/dL — ABNORMAL HIGH (ref 70–99)
Glucose-Capillary: 84 mg/dL (ref 70–99)
Glucose-Capillary: 94 mg/dL (ref 70–99)
Glucose-Capillary: 95 mg/dL (ref 70–99)

## 2021-05-14 LAB — MAGNESIUM: Magnesium: 1.9 mg/dL (ref 1.7–2.4)

## 2021-05-14 MED ORDER — BOOST / RESOURCE BREEZE PO LIQD CUSTOM
1.0000 | Freq: Three times a day (TID) | ORAL | Status: DC
  Administered 2021-05-14 – 2021-05-16 (×6): 1 via ORAL
  Filled 2021-05-14: qty 1

## 2021-05-14 MED ORDER — PROSOURCE TF PO LIQD
45.0000 mL | Freq: Three times a day (TID) | ORAL | Status: DC
  Administered 2021-05-14 – 2021-05-17 (×9): 45 mL
  Filled 2021-05-14 (×9): qty 45

## 2021-05-14 MED ORDER — OSMOLITE 1.5 CAL PO LIQD
1000.0000 mL | ORAL | Status: DC
  Administered 2021-05-14: 1000 mL
  Filled 2021-05-14: qty 1000

## 2021-05-14 NOTE — Progress Notes (Signed)
Progress Note    Amy [name]  ACZ:[id number] DOB: 1950/05/14  DOA: 05/08/2021 PCP: Murlean Iba, MD    Brief Narrative:     Medical records reviewed and are as summarized below:  Amy [name] is an 71 y.o. female with hx of subdural hematoma and prior seizures who presents from SNF with decreased responsiveness.  Evaluated by neurology at [facility] who evaluated a spot EEG that raised concern for ongoing seizures so transferred to [facility] for formal management.    Assessment/Plan:   Principal Problem:   Seizure ([place]) Active Problems:   Acute metabolic encephalopathy   ESRD (end stage renal disease) on dialysis ([place])   Secondary hypertension   Status epilepticus Chronic subdural hematoma ~9 mo ago - Continue Vimpat/depakote per tube - EEG monitoring discontinued - Neuro checks per unit protocol   Hx of CAD w/ prior DES Hx of ischemic cardiomyopathy - per neuro; SBP goal 140-180 - metoprolol/losartan, zetia, statin. Can restart home hydralazine if BP remains above goal.  - PRN hydralazine   Acute on chronic pleural effusion: large left and moderate right. Have been very slowly progressive since the time of her SDH and functional decline. Likely from volume overload +/- nutritional deficiencies. -volume with HD   ESRD on HD dialysis MWF - Dialysis per nephrology   Hyponatremia: hx of hyponatremia -mild   Supratherapeutic INR: 5.2 on admission. Warfarin use for HD catheter patency.  Per family she is on low-dose Coumadin to prevent catheter clotting. - Restart home warfarin 1mg  daily.  - Intermittent INR- defer to nephrology   Acute cystitis: present on admission. Cultures growing VRE. Patient had complained of dysuria to her daughter in the days leading to her presentation.  - Amoxicillin per tube for 5 total days through 1/14   Severe malnutrition, present on admission - nocturnal tube feeds, allow to eat during the day - SLP eval ongoing   Thyroid nodules - OP  follow-up pending clinical course.    Goals of care - DNR, palliative care following. Appreciate their assistance.   Nutrition Status: Nutrition Problem: Severe Malnutrition Etiology: chronic illness Signs/Symptoms: severe fat depletion, severe muscle depletion Interventions: Tube feeding, Prostat, MVI   Pressure Injury 05/11/21 Sacrum Medial Stage 2 -  Partial thickness loss of dermis presenting as a shallow open injury with a red, pink wound bed without slough. (Active)  05/11/21 1500  Location: Sacrum  Location Orientation: Medial  Staging: Stage 2 -  Partial thickness loss of dermis presenting as a shallow open injury with a red, pink wound bed without slough.  Wound Description (Comments):   Present on Admission: Yes        Family Communication/Anticipated D/C date and plan/Code Status   Code Status: DNR Family Communication:at bedside Disposition Plan: Status is: Inpatient  Remains inpatient appropriate because: will need to return to SNF when no longer on tube feeds- long term resident at [facility] Consultants:   [facility] Renal Palliative cre   Subjective:   Family feels like she is making slow progress  Objective:    Vitals:   05/14/21 0019 05/14/21 0327 05/14/21 0732 05/14/21 1110  BP:  (!) 172/88 (!) 163/88 (!) 148/76  Pulse:  (!) 112 (!) 106 96  Resp:  20 16 (!) 22  Temp: 99.8 F (37.7 C) 99.3 F (37.4 C) 98.5 F (36.9 C) 99.1 F (37.3 C)  TempSrc: Oral Oral Oral Oral  SpO2:  94% 96% 92%  Weight:        Intake/Output Summary (Last 24 hours) at 05/14/2021 1222 Last data filed at 05/13/2021 1653 Gross per 24 hour  Intake --  Output 2042 ml  Net -2042 ml   Filed Weights   05/12/21 0823  Weight: 46.3 kg    Exam:  General: Appearance:    Thin very frail female in no acute distress     Lungs:      respirations unlabored  Heart:    Normal heart rate.   MS:   All extremities are intact.    Neurologic:   Awake, alert      Data Reviewed:   I have personally reviewed following labs and imaging studies:  Labs: Labs show the following:   Basic Metabolic Panel: Recent Labs  Lab 05/10/21 0858 05/11/21 0456 05/11/21 1656 05/12/21 0611 05/12/21 1619 05/13/21 0600 05/14/21 0409  NA 133* 131*  --  133*  --  128* 134*  K 3.7 3.3*  --  3.5  --  4.1 4.7  CL 99 96*  --  99  --  95* 99  CO2 20* 24  --  25  --  23 27  GLUCOSE 103* 78  --  116*  --  118* 111*  BUN 41* 17  --  27*  --  52* 31*  CREATININE 4.68* 2.73*  --  2.23*  --  3.08* 1.93*  CALCIUM 9.0 8.7*  --  8.6*  --  8.7* 8.9  MG  --  1.8 1.8 1.9 1.9 1.8 1.9  PHOS 4.8* 4.3 3.3 3.5 3.4 3.4 2.5   GFR Estimated Creatinine Clearance: 19.3 mL/min (A) (by C-G formula based on SCr of 1.93 mg/dL (H)). Liver Function Tests: Recent Labs  Lab 05/08/21 0919 05/10/21 0858  AST 17  --   ALT 11  --   ALKPHOS 108  --   BILITOT 0.7  --   PROT 5.3*  --   ALBUMIN 2.5* 2.6*   No results for input(s): LIPASE, AMYLASE in the last 168 hours. Recent Labs  Lab 05/08/21 1638  AMMONIA 30   Coagulation profile Recent Labs  Lab 05/08/21 0919 05/09/21 0447 05/10/21 0653 05/11/21 0456 05/14/21 0409  INR 4.0* 4.0* 5.2* 1.5* 1.5*    CBC: Recent Labs  Lab 05/08/21 0919 05/09/21 0557 05/10/21 0858 05/11/21 0456 05/13/21 0600 05/13/21 1034 05/14/21 0409  WBC 11.3*   < > 22.8* 15.6* 12.2* 14.5* 7.9  NEUTROABS 8.9*  --   --   --   --   --   --   HGB 10.9*   < > 9.8* 10.4* 10.4* 11.3* 9.9*  HCT 33.0*   < > 29.8* 31.4* 31.3* 35.4* 30.5*  MCV 91.7   < > 91.4 91.8 91.8 93.2 93.0  PLT 241   < > 307 213 241 256 227   < > = values in this interval not displayed.   Cardiac Enzymes: No results for input(s): CKTOTAL, CKMB, CKMBINDEX, TROPONINI in the last 168 hours. BNP (last 3 results) No results for input(s): PROBNP in the last 8760 hours. CBG: Recent Labs  Lab 05/13/21 2129 05/14/21 0009 05/14/21 0340 05/14/21 0730 05/14/21 1108  GLUCAP 113*  113* 120* 114* 84   D-Dimer: No results for input(s): DDIMER in the last 72 hours. Hgb A1c: No results for input(s): HGBA1C in the last 72 hours. Lipid Profile: No results for input(s): CHOL, HDL, LDLCALC, TRIG, CHOLHDL, LDLDIRECT in the last 72 hours. Thyroid function studies: No  results for input(s): TSH, T4TOTAL, T3FREE, THYROIDAB in the last 72 hours.  Invalid input(s): FREET3 Anemia work up: No results for input(s): VITAMINB12, FOLATE, FERRITIN, TIBC, IRON, RETICCTPCT in the last 72 hours. Sepsis Labs: Recent Labs  Lab 05/11/21 0456 05/13/21 0600 05/13/21 1034 05/14/21 0409  WBC 15.6* 12.2* 14.5* 7.9    Microbiology Recent Results (from the past 240 hour(s))  Resp Panel by RT-PCR (Flu A&B, Covid) Nasopharyngeal Swab     Status: None   Collection Time: 05/08/21  9:34 AM   Specimen: Nasopharyngeal Swab; Nasopharyngeal(NP) swabs in vial transport medium  Result Value Ref Range Status   SARS Coronavirus 2 by RT PCR NEGATIVE NEGATIVE Final    Comment: (NOTE) SARS-CoV-2 target nucleic acids are NOT DETECTED.  The SARS-CoV-2 RNA is generally detectable in upper respiratory specimens during the acute phase of infection. The lowest concentration of SARS-CoV-2 viral copies this assay can detect is 138 copies/mL. A negative result does not preclude SARS-Cov-2 infection and should not be used as the sole basis for treatment or other patient management decisions. A negative result may occur with  improper specimen collection/handling, submission of specimen other than nasopharyngeal swab, presence of viral mutation(s) within the areas targeted by this assay, and inadequate number of viral copies(<138 copies/mL). A negative result must be combined with clinical observations, patient history, and epidemiological information. The expected result is Negative.  Fact Sheet for Patients:  EntrepreneurPulse.com.au  Fact Sheet for Healthcare Providers:   IncredibleEmployment.be  This test is no t yet approved or cleared by the [place] FDA and  has been authorized for detection and/or diagnosis of SARS-CoV-2 by FDA under an Emergency Use Authorization (EUA). This EUA will remain  in effect (meaning this test can be used) for the duration of the COVID-19 declaration under Section 564(b)(1) of the Act, 21 U.S.C.section 360bbb-3(b)(1), unless the authorization is terminated  or revoked sooner.       Influenza A by PCR NEGATIVE NEGATIVE Final   Influenza B by PCR NEGATIVE NEGATIVE Final    Comment: (NOTE) The Xpert Xpress SARS-CoV-2/FLU/RSV plus assay is intended as an aid in the diagnosis of influenza from Nasopharyngeal swab specimens and should not be used as a sole basis for treatment. Nasal washings and aspirates are unacceptable for Xpert Xpress SARS-CoV-2/FLU/RSV testing.  Fact Sheet for Patients: EntrepreneurPulse.com.au  Fact Sheet for Healthcare Providers: IncredibleEmployment.be  This test is not yet approved or cleared by the [place] FDA and has been authorized for detection and/or diagnosis of SARS-CoV-2 by FDA under an Emergency Use Authorization (EUA). This EUA will remain in effect (meaning this test can be used) for the duration of the COVID-19 declaration under Section 564(b)(1) of the Act, 21 U.S.C. section 360bbb-3(b)(1), unless the authorization is terminated or revoked.  Performed at [facility], [address] [place], [place] [address]   MRSA Next Gen by PCR, Nasal     Status: None   Collection Time: 05/09/21  3:11 PM   Specimen: Nasal Mucosa; Nasal Swab  Result Value Ref Range Status   MRSA by PCR Next Gen NOT DETECTED NOT DETECTED Final    Comment: (NOTE) The GeneXpert MRSA Assay (FDA approved for NASAL specimens only), is one component of a comprehensive MRSA colonization surveillance program. It is not intended to  diagnose MRSA infection nor to guide or monitor treatment for MRSA infections. Test performance is not FDA approved in patients less than 48 years old. Performed at [facility], [place]  [address], [place], [place] [address]   Urine Culture     Status: Abnormal   Collection Time: 05/09/21  3:11 PM   Specimen: Urine, Clean Catch  Result Value Ref Range Status   Specimen Description URINE, CLEAN CATCH  Final   Special Requests   Final    NONE Performed at [facility], [place] [address], [place], [place] [address]    Culture (A)  Final    >=100,000 COLONIES/mL ENTEROCOCCUS FAECALIS VANCOMYCIN RESISTANT ENTEROCOCCUS ISOLATED    Report Status 05/12/2021 FINAL  Final   Organism ID, Bacteria ENTEROCOCCUS FAECALIS (A)  Final      Susceptibility   Enterococcus faecalis - MIC*    AMPICILLIN <=2 SENSITIVE Sensitive     NITROFURANTOIN <=16 SENSITIVE Sensitive     VANCOMYCIN >=32 RESISTANT Resistant     GENTAMICIN SYNERGY RESISTANT Resistant     LINEZOLID 2 SENSITIVE Sensitive     * >=100,000 COLONIES/mL ENTEROCOCCUS FAECALIS    Procedures and diagnostic studies:  DG CHEST PORT 1 VIEW  Result Date: 05/13/2021 CLINICAL DATA:  Pleural effusion. EXAM: PORTABLE CHEST 1 VIEW COMPARISON:  05/11/2021 and CT chest 05/08/2021. FINDINGS: Interval placement of a feeding tube which extends beyond the inferior margin of the image. Right IJ dialysis catheter tip is in the low SVC. Heart size is grossly stable. Large left pleural effusion is similar to minimally decreased in size, with layering posteriorly on this semi upright image. Associated left basilar collapse/consolidation, unchanged. Minimal right basilar airspace opacification. Small right pleural effusion. Biapical pleural thickening. IMPRESSION: 1. Large left pleural effusion, similar to minimally decreased in size from 05/11/2021. Associated collapse/consolidation in the left lower lobe. Pneumonia cannot be excluded. 2. Minimal right  basilar airspace opacification, possibly due to atelectasis. Again, difficult to exclude pneumonia. 3. Small right pleural effusion. Electronically Signed   By: Lorin Picket M.D.   On: 05/13/2021 08:27    Medications:    amoxicillin  500 mg Per Tube QHS   atorvastatin  40 mg Oral Daily   chlorhexidine  15 mL Mouth Rinse BID   Chlorhexidine Gluconate Cloth  6 each Topical Daily   Chlorhexidine Gluconate Cloth  6 each Topical Q0600   ezetimibe  10 mg Oral Daily   feeding supplement (NEPRO CARB STEADY)  237 mL Oral BID BM   feeding supplement (OSMOLITE 1.5 CAL)  1,000 mL Per Tube Q24H   feeding supplement (PROSource TF)  45 mL Per Tube TID   lacosamide  100 mg Per Tube BID   losartan  50 mg Oral Daily   mouth rinse  15 mL Mouth Rinse q12n4p   metoprolol tartrate  12.5 mg Per Tube BID   multivitamin  1 tablet Per Tube QHS   pantoprazole sodium  40 mg Per Tube QHS   sertraline  50 mg Oral Daily   valproic acid  500 mg Per Tube BID   warfarin  1 mg Oral q1600   Warfarin - Physician Dosing Inpatient   Does not apply q1600   Continuous Infusions:   LOS: 6 days   Geradine Girt  [facility]   How to contact the [facility] Attending or Consulting provider [place] or covering provider during after hours [place], for this patient?  Check the care team in [facility] and look for a) attending/consulting TRH provider listed and b) the [facility] team listed Log into [email or web address] and use [place]'s universal password to access. If you do not have the password, please  contact the hospital operator. Locate the [facility] provider you are looking for under [facility] and page to a number that you can be directly reached. If you still have difficulty reaching the provider, please page the [facility] (Director on Call) for the Hospitalists listed on amion for assistance.  05/14/2021, 12:22 PM

## 2021-05-14 NOTE — Progress Notes (Signed)
Nutrition Follow-up  DOCUMENTATION CODES:   Severe malnutrition in context of chronic illness  INTERVENTION:   Transition to nocturnal tube feeds via Cortrak: - Osmolite 1.5 @ 65 ml/hr x 12 hours from 1800 to 0600 (total of 780 ml) - ProSource TF 45 ml TID  Nocturnal tube feeding regimen provides 1290 kcal, 82 grams of protein, and 618 ml of H2O (meets 86% of minium kcal needs and 100% of minimum protein needs).  - Boost Breeze po TID, each supplement provides 250 kcal and 9 grams of protein  - d/c Nepro Shake  - Continue renal MVI daily  - Encourage PO intake  NUTRITION DIAGNOSIS:   Severe Malnutrition related to chronic illness as evidenced by severe fat depletion, severe muscle depletion.  Ongoing, being addressed via TF, oral nutrition supplements, diet advancement  GOAL:   Patient will meet greater than or equal to 90% of their needs  Progressing  MONITOR:   TF tolerance, Diet advancement  REASON FOR ASSESSMENT:   Consult Enteral/tube feeding initiation and management  ASSESSMENT:   Pt with PMH of anxiety, GERD, HTN, SDH 2022, seizures, CAD, ischemic cardiomyopathy, ESRD on HD MWF, chronic pleural effusion admitted 1/6 from SNF with acute metabolic encephalopathy,  seizures, and acute on chronic pleural effusion large left and moderate right likely from volume overload.  01/09 - Cortrak placed (tip gastric) 01/10 - diet advanced to dysphagia 2 with thin liquids  Last HD on 1/11 with 2042 ml net UF.  Spoke with pt, husband, and daughter at bedside. Pt's family reports that immediately after diet advancement on 1/10, pt consumed 75% of dinner meal. However, since that time, pt has not been eating quite as much. Pt's family reports pt consumed some of eggs, some of sausage, all of pineapple (which she really enjoyed), and juice this morning for breakfast. Will transition pt to nocturnal tube feeds to stimulate appetite and promote PO intake during the day. MD,  RN, and pt's family in agreement with plan.  Pt does not like the Nepro Shakes but is interested in trying the [employer]. Will order these supplements TID between meals. Will also order pureed pineapple to come with each meal as pt really enjoyed this per family. Will continue renal MVI daily.  Noted Palliative Care team is following pt.  Current TF: Osmolite 1.5 @ 40 ml/hr, ProSource TF 45 ml BID  Updated EDW: 48.5 kg Weight on 1/10: 46.3 kg  Pt with mild pitting edema to BUE and non-pitting edema to RLE.  Meal Completion: 75% x 1 documented meal on 1/10, 40% x 1 documented meal today  Medications reviewed and include: Nepro BID, rena-vit, protonix, warfarin  Labs reviewed: sodium 134, hemoglobin 9.9 CBG's: 113-133 x 24 hours  I/O's: -5.0 L since admit  Diet Order:   Diet Order             DIET DYS 2 Room service appropriate? Yes with Assist; Fluid consistency: Thin  Diet effective now                   EDUCATION NEEDS:   No education needs have been identified at this time  Skin:  Skin Assessment: Skin Integrity Issues: Stage II: sacrum Other: scabbed areas below nose from shingles  Last BM:  05/13/21 smear type 6  Height:   Ht Readings from Last 1 Encounters:  05/08/21 5\' 4"  (1.626 m)    Weight:   Wt Readings from Last 1 Encounters:  05/08/21 52.9 kg  Ideal Body Weight:  54.5 kg  BMI:  Body mass index is 17.52 kg/m.  Estimated Nutritional Needs:   Kcal:  1500-1700  Protein:  75-90 grams  Fluid:  1.2 L/day    Gustavus Bryant, MS, RD, LDN Inpatient Clinical Dietitian Please see AMiON for contact information.

## 2021-05-14 NOTE — Progress Notes (Signed)
Physical Therapy Treatment Patient Details Name: Amy [name] MRN: [id number] DOB: 02-07-1951 Today's Date: 05/14/2021   History of Present Illness 71 y/o female presented to [facility] ED on 05/08/21 for AMS. Seizure likey activity noted in ED. Transferred to [facility]. ST EEG negative. PMH: ESRD on HD (MWF), anxiety, HFrEF (EF 25%), severe diffuse CAD s/p DES proximal LAD (06/27/2020), R hip fx, acute on chronic SDH s/p partial craniotomy and evacuation of SDH (in March 2022), seizures, depression, and HTN    PT Comments    Patient's level of alertness has improved since initial evaluation. Patient requires maxA+2 for bed mobility and maxA to maintain sitting balance. Focused on trunk control, head control, and LE strengthening. Educated family on AAROM exercises of bilateral LE to maintain strength and ROM. Encouraged sitting on patient's R side due to patient leaning towards L and head turned to L in bed. Continue to recommend SNF for ongoing Physical Therapy.      Recommendations for follow up therapy are one component of a multi-disciplinary discharge planning process, led by the attending physician.  Recommendations may be updated based on patient status, additional functional criteria and insurance authorization.  Follow Up Recommendations  Skilled nursing-short term rehab (<3 hours/day)     Assistance Recommended at Discharge Frequent or constant Supervision/Assistance  Patient can return home with the following Two people to help with walking and/or transfers;Two people to help with bathing/dressing/bathroom;Assistance with cooking/housework;Assistance with feeding   Equipment Recommendations  None recommended by PT    Recommendations for Other Services       Precautions / Restrictions Precautions Precautions: Fall;Other (comment) Precaution Comments: fragile skin; at risk for skin breakdown, prevalon boots Restrictions Weight Bearing Restrictions: No     Mobility  Bed  Mobility Overal bed mobility: Needs Assistance Bed Mobility: Rolling;Supine to Sit;Sit to Supine Rolling: Max assist   Supine to sit: Max assist;+2 for physical assistance Sit to supine: Max assist;+2 for physical assistance   General bed mobility comments: maxA+2 for all aspects of bed mobility with multimodal cues for sequencing    Transfers                   General transfer comment: NA    Ambulation/Gait                   Stairs             Wheelchair Mobility    Modified Rankin (Stroke Patients Only)       Balance Overall balance assessment: Needs assistance Sitting-balance support: Feet supported Sitting balance-Leahy Scale: Poor Sitting balance - Comments: tolerated sitting EOB x 10 minutes Postural control: Posterior lean;Right lateral lean                                  Cognition Arousal/Alertness: Awake/alert Behavior During Therapy: WFL for tasks assessed/performed Overall Cognitive Status: Impaired/Different from baseline Area of Impairment: Orientation;Attention;Memory;Awareness;Problem solving                 Orientation Level: Disoriented to;Time;Situation Current Attention Level: Sustained Memory: Decreased short-term memory     Awareness: Intellectual Problem Solving: Difficulty sequencing;Requires verbal cues;Requires tactile cues;Slow processing;Decreased initiation General Comments: patient with slow processing requiring multimodal cueing for sequencing        Exercises General Exercises - Lower Extremity Long Arc Quad: AROM;Both;5 reps;Seated Other Exercises Other Exercises: attempted reaching task but patient fatigued Other Exercises: anterior  pelvic tilts x 3    General Comments        Pertinent Vitals/Pain Pain Assessment: Faces Faces Pain Scale: Hurts a little bit Pain Location: buttocks Pain Descriptors / Indicators: Grimacing Pain Intervention(s): Monitored during  session;Repositioned    Home Living                          Prior Function            PT Goals (current goals can now be found in the care plan section) Acute Rehab PT Goals Patient Stated Goal: family wants patient to return to SNF PT Goal Formulation: With family Time For Goal Achievement: 05/23/21 Potential to Achieve Goals: Poor Progress towards PT goals: Progressing toward goals    Frequency    Min 2X/week      PT Plan Current plan remains appropriate    Co-evaluation PT/OT/SLP Co-Evaluation/Treatment: Yes Reason for Co-Treatment: Necessary to address cognition/behavior during functional activity;For patient/therapist safety;To address functional/ADL transfers PT goals addressed during session: Mobility/safety with mobility;Strengthening/ROM OT goals addressed during session: ADL's and self-care      AM-PAC PT "6 Clicks" Mobility   Outcome Measure  Help needed turning from your back to your side while in a flat bed without using bedrails?: Total Help needed moving from lying on your back to sitting on the side of a flat bed without using bedrails?: Total Help needed moving to and from a bed to a chair (including a wheelchair)?: Total Help needed standing up from a chair using your arms (e.g., wheelchair or bedside chair)?: Total Help needed to walk in hospital room?: Total Help needed climbing 3-5 steps with a railing? : Total 6 Click Score: 6    End of Session   Activity Tolerance: Patient tolerated treatment well Patient left: in bed;with call bell/phone within reach;with bed alarm set;with family/visitor present Nurse Communication: Mobility status PT Visit Diagnosis: Muscle weakness (generalized) (M62.81)     Time: 0177-9390 PT Time Calculation (min) (ACUTE ONLY): 29 min  Charges:  $Therapeutic Exercise: 8-22 mins                     Geneal Huebert A. Gilford Rile PT, DPT Acute Rehabilitation Services Pager [phone] Office  [phone]    Linna Hoff 05/14/2021, 2:57 PM

## 2021-05-14 NOTE — Progress Notes (Signed)
[facility] Progress Note   Subjective:   pt seen in room, no c/o's today. Says she at a "full meal"  Objective Vitals:   05/14/21 0019 05/14/21 0327 05/14/21 0732 05/14/21 1110  BP:  (!) 172/88 (!) 163/88 (!) 148/76  Pulse:  (!) 112 (!) 106 96  Resp:  20 16 (!) 22  Temp: 99.8 F (37.7 C) 99.3 F (37.4 C) 98.5 F (36.9 C) 99.1 F (37.3 C)  TempSrc: Oral Oral Oral Oral  SpO2:  94% 96% 92%  Weight:       Physical Exam General:chronically ill , very weak, alert and responding, cortrak w/ TF's Heart:RRR, no mrg Lungs: mostly CTAB anterolaterally, BS decreased, nml WOB on RA Abdomen:soft, NTND Extremities: 1+ edema in b/l UE, no LE edema Dialysis Access: TDC      CXR 1/07, 1/09 > large L > R pleural effusions   CT chest 1/06 - large L > R pleural effusions, no edema  OP HD: MWF [facility]  3h 46min  3590/500  48.5kg   TDC R  Hep 1600 + 600u/hr  - mircera 50 ug q 4wks  Assessment/Plan: Status epilecticus/ chronic SDH - improved, getting vimpat/ depakote through NG tube now. More alert. Per neuro/ pmd Acute cystitis - +VRE on culture, getting amoxicillin per tube x 5 days  ESRD - HD MWF. HD Friday. HD time will be shortened due to high census/ staffing issues.   Hypertension - BP's high on admission, SP cardene gtt. Metoprolol and losartan restarted per NG.  Vol overload  - CXR unchanged bilat effusions. Sig volume reduction here after HD x 3. Slightly below her dry wt. No sig excess now. Cont to lower vol as tolerated by BP  Anemia of CKD - Hgb 10.0.  Hold ESA for now and monitor labs.    Secondary Hyperparathyroidism -  Calcium in goal.  Not on binders as outpatient.   Nutrition - currently NPO  CAD/ hx prior DES/ hx ICM Leukocytosis - checking UA and cultures. Per PMD Hx SDH s/p craniotomy  Shingles - was on Valtrex prior to admission.  Kelly Splinter, MD 05/14/2021, 11:49 AM       Basic Metabolic Panel: Recent Labs  Lab 05/12/21 0611  05/12/21 1619 05/13/21 0600 05/14/21 0409  NA 133*  --  128* 134*  K 3.5  --  4.1 4.7  CL 99  --  95* 99  CO2 25  --  23 27  GLUCOSE 116*  --  118* 111*  BUN 27*  --  52* 31*  CREATININE 2.23*  --  3.08* 1.93*  CALCIUM 8.6*  --  8.7* 8.9  PHOS 3.5 3.4 3.4 2.5     CBC: Recent Labs  Lab 05/08/21 0919 05/09/21 0557 05/10/21 0858 05/11/21 0456 05/13/21 0600 05/13/21 1034 05/14/21 0409  WBC 11.3*   < > 22.8* 15.6* 12.2* 14.5* 7.9  NEUTROABS 8.9*  --   --   --   --   --   --   HGB 10.9*   < > 9.8* 10.4* 10.4* 11.3* 9.9*  HCT 33.0*   < > 29.8* 31.4* 31.3* 35.4* 30.5*  MCV 91.7   < > 91.4 91.8 91.8 93.2 93.0  PLT 241   < > 307 213 241 256 227   < > = values in this interval not displayed.    Medications:    amoxicillin  500 mg Per Tube QHS   atorvastatin  40 mg Oral Daily  chlorhexidine  15 mL Mouth Rinse BID   Chlorhexidine Gluconate Cloth  6 each Topical Daily   Chlorhexidine Gluconate Cloth  6 each Topical Q0600   ezetimibe  10 mg Oral Daily   feeding supplement (NEPRO CARB STEADY)  237 mL Oral BID BM   feeding supplement (OSMOLITE 1.5 CAL)  1,000 mL Per Tube Q24H   feeding supplement (PROSource TF)  45 mL Per Tube TID   lacosamide  100 mg Per Tube BID   losartan  50 mg Oral Daily   mouth rinse  15 mL Mouth Rinse q12n4p   metoprolol tartrate  12.5 mg Per Tube BID   multivitamin  1 tablet Per Tube QHS   pantoprazole sodium  40 mg Per Tube QHS   sertraline  50 mg Oral Daily   valproic acid  500 mg Per Tube BID   warfarin  1 mg Oral q1600   Warfarin - Physician Dosing Inpatient   Does not apply [id number]

## 2021-05-14 NOTE — Progress Notes (Signed)
Occupational Therapy Treatment Patient Details Name: Amy [name] MRN: [id number] DOB: [birth year] Today's Date: 05/14/2021   History of present illness 71 y/o female presented to [facility] ED on 05/08/21 for AMS. Seizure likey activity noted in ED. Transferred to [facility]. ST EEG negative. PMH: ESRD on HD (MWF), anxiety, HFrEF (EF 25%), severe diffuse CAD s/p DES proximal LAD (06/27/2020), R hip fx, acute on chronic SDH s/p partial craniotomy and evacuation of SDH (in March 2022), seizures, depression, and HTN   OT comments  Patient with improved level of alertness and ability to participate this session.  Seen in conjunction with PT.  Patient patient with baseline cognitive dysfunction, but able to answer basic questions.  Max A of 2 for bed mobility, and up to Max A to maintain balance sitting at edge of bed.  Session surrounded trunk control, encouraging anterior pelvic tilt, and holding her head up/posture while taking deep breaths.  She remains very weak with bruises throughout her arms, with edema noted.  Family encouraged to provide AAROM to bilateral upper extremities, education for propping arm on pillows for edema control, and staying on her R side to encourage head turing.  Acute OT to continue efforts, but SNF for transition to long term is recommended.        Recommendations for follow up therapy are one component of a multi-disciplinary discharge planning process, led by the attending physician.  Recommendations may be updated based on patient status, additional functional criteria and insurance authorization.    Follow Up Recommendations  Skilled nursing-short term rehab (<3 hours/day)    Assistance Recommended at Discharge Frequent or constant Supervision/Assistance  Patient can return home with the following      Equipment Recommendations  None recommended by OT    Recommendations for Other Services      Precautions / Restrictions Precautions Precautions: Fall;Other  (comment) Precaution Comments: fragile skin; at risk for skin breakdown Restrictions Weight Bearing Restrictions: No       Mobility Bed Mobility Overal bed mobility: Needs Assistance Bed Mobility: Rolling;Supine to Sit;Sit to Supine Rolling: Max assist   Supine to sit: Max assist;+2 for physical assistance Sit to supine: Max assist;+2 for physical assistance        Transfers                   General transfer comment: NA     Balance Overall balance assessment: Needs assistance Sitting-balance support: Feet supported Sitting balance-Leahy Scale: Poor   Postural control: Posterior lean;Right lateral lean                                 ADL either performed or assessed with clinical judgement   ADL                                         General ADL Comments: total A.  POer daughter, patient was able to self feed and complete light grooming seated in wheelchair.  Otherwise bedbound, wheelchair bound.    Extremity/Trunk Assessment Upper Extremity Assessment Upper Extremity Assessment: Generalized weakness;RUE deficits/detail;LUE deficits/detail RUE Deficits / Details: multiple areas of brusing, swollen and blood blister to forearm RUE Sensation: WNL RUE Coordination: decreased fine motor;decreased gross motor LUE Deficits / Details: multiple areas of brusing, swollen and blood blister to bicep LUE Coordination: decreased fine motor;decreased gross  motor;WNL       Cervical / Trunk Assessment Cervical / Trunk Assessment: Kyphotic    Vision Patient Visual Report: No change from baseline     Perception Perception Perception: Not tested   Praxis Praxis Praxis: Not tested    Cognition Arousal/Alertness: Awake/alert Behavior During Therapy: WFL for tasks assessed/performed Overall Cognitive Status: History of cognitive impairments - at baseline                                 General Comments: patient with  baseline orientation and memory deficits.  Does follow simple commands with increased time and cues.                      General Comments  VSS    Pertinent Vitals/ Pain       Pain Assessment: No/denies pain                                                          Frequency  Min 1X/week        Progress Toward Goals  OT Goals(current goals can now be found in the care plan section)  Progress towards OT goals: Progressing toward goals  Acute Rehab OT Goals Patient Stated Goal: Return to SNF OT Goal Formulation: With family Time For Goal Achievement: 05/23/21 Potential to Achieve Goals: [place] Discharge plan remains appropriate    Co-evaluation    PT/OT/SLP Co-Evaluation/Treatment: Yes Reason for Co-Treatment: Complexity of the patient's impairments (multi-system involvement);For patient/therapist safety;To address functional/ADL transfers   OT goals addressed during session: ADL's and self-care      AM-PAC OT "6 Clicks" Daily Activity     Outcome Measure   Help from another person eating meals?: Total Help from another person taking care of personal grooming?: Total Help from another person toileting, which includes using toliet, bedpan, or urinal?: Total Help from another person bathing (including washing, rinsing, drying)?: Total Help from another person to put on and taking off regular upper body clothing?: Total Help from another person to put on and taking off regular lower body clothing?: Total 6 Click Score: 6    End of Session    OT Visit Diagnosis: Other abnormalities of gait and mobility (R26.89);Muscle weakness (generalized) (M62.81);Other symptoms and signs involving cognitive function   Activity Tolerance Patient tolerated treatment well   Patient Left in bed;with call bell/phone within reach;with family/visitor present   Nurse Communication          Time: 8115-7262 OT Time Calculation (min): 29  min  Charges: OT General Charges $OT Visit: 1 Visit OT Treatments $Therapeutic Activity: 8-22 mins  05/14/2021  RP, OTR/L  Acute Rehabilitation Services  Office:  [phone]   Metta Clines 05/14/2021, 2:43 PM

## 2021-05-14 NOTE — Consult Note (Signed)
[place] Nurse Consult Note: Patient receiving care in [facility] [phone] Reason for Consult: Stage 2 on coccyx Wound type: Healing stage 2 on the coccyx and on the lower vertebral area.  Pressure Injury POA: Yes Measurement: 0.2 x 0.2 coccyx Vertebral 0.5 x 0.5 Wound bed: pink Drainage (amount, consistency, odor) None Periwound: Intact Dressing procedure/placement/frequency: Continue foam dressings to both areas. Peel back and assess daily. Change foam dressings every 3 days.   Monitor the wound area(s) for worsening of condition such as: Signs/symptoms of infection, increase in size, development of or worsening of odor, development of pain, or increased pain at the affected locations.   Notify the medical team if any of these develop.  Thank you for the consult. [place] nurse will not follow at this time.   Please re-consult the [place] team if needed.  Cathlean Marseilles Tamala Julian, MSN, RN, [place], Lysle Pearl, [facility] Wound Treatment Associate Pager [phone]

## 2021-05-14 NOTE — Progress Notes (Signed)
05/14/21 IM Letter mailed to patient's home address due to patient's isolation status.

## 2021-05-14 NOTE — Care Management Important Message (Signed)
Important Message  Patient Details  Name: Amy [name] MRN: [id number] Date of Birth: [birth year]   Medicare Important Message Given:  Other (see comment)     H[place] Beat 05/14/2021, 2:16 PM

## 2021-05-15 LAB — GLUCOSE, CAPILLARY
Glucose-Capillary: 108 mg/dL — ABNORMAL HIGH (ref 70–99)
Glucose-Capillary: 88 mg/dL (ref 70–99)
Glucose-Capillary: 76 mg/dL (ref 70–99)
Glucose-Capillary: 84 mg/dL (ref 70–99)
Glucose-Capillary: 126 mg/dL — ABNORMAL HIGH (ref 70–99)

## 2021-05-15 MED ORDER — HYDRALAZINE HCL 10 MG PO TABS
10.0000 mg | ORAL_TABLET | Freq: Three times a day (TID) | ORAL | Status: DC
  Administered 2021-05-15 – 2021-05-17 (×6): 10 mg via ORAL
  Filled 2021-05-15 (×6): qty 1

## 2021-05-15 MED ORDER — HEPARIN SODIUM (PORCINE) 1000 UNIT/ML IJ SOLN
INTRAMUSCULAR | Status: AC
  Administered 2021-05-15: 1000 [IU]
  Filled 2021-05-15: qty 4

## 2021-05-15 MED ORDER — OSMOLITE 1.5 CAL PO LIQD
1000.0000 mL | ORAL | Status: DC
  Administered 2021-05-15 – 2021-05-16 (×2): 1000 mL
  Filled 2021-05-15 (×2): qty 1000

## 2021-05-15 NOTE — Progress Notes (Signed)
Progress Note    Amy [name]  YIR:[id number] DOB: December 26, 1950  DOA: 05/08/2021 PCP: Murlean Iba, MD    Brief Narrative:     Medical records reviewed and are as summarized below:  Amy [name] is an 71 y.o. female with hx of subdural hematoma and prior seizures who presents from SNF with decreased responsiveness.  Evaluated by neurology at [facility] who evaluated a spot EEG that raised concern for ongoing seizures so transferred to [facility] for formal management.   was in the ICU for a prolonged time.    Assessment/Plan:   Principal Problem:   Seizure ([place]) Active Problems:   Acute metabolic encephalopathy   ESRD (end stage renal disease) on dialysis ([place])   Secondary hypertension   Status epilepticus Chronic subdural hematoma ~9 mo ago - Continue Vimpat/depakote per tube until able to take PO   Hx of CAD w/ prior DES Hx of ischemic cardiomyopathy - per neuro; SBP goal 140-180 - metoprolol/losartan, zetia, statin.  -resume home hydralazine at lower dose  Left arm swelling -check duplex to r/o DVT -elevate extremity  Acute on chronic pleural effusion: large left and moderate right. Have been very slowly progressive since the time of her SDH and functional decline. Likely from volume overload +/- nutritional deficiencies. -volume with HD   ESRD on HD dialysis MWF - Dialysis per nephrology   Hyponatremia: hx of hyponatremia -mild   Supratherapeutic INR: 5.2 on admission. Warfarin use for HD catheter patency.  Per family she is on low-dose Coumadin to prevent catheter clotting. - Restart home warfarin 1mg  daily.  - Intermittent INR- defer to nephrology   Acute cystitis: present on admission. Cultures growing VRE. Patient had complained of dysuria to her daughter in the days leading to her presentation.  - Amoxicillin per tube for 5 total days through 1/14   Severe malnutrition, present on admission - nocturnal tube feeds, allow to eat during the day - SLP eval  ongoing   Thyroid nodules - OP follow-up pending clinical course.    Goals of care - DNR, palliative care following. Appreciate their assistance.   Nutrition Status: Nutrition Problem: Severe Malnutrition Etiology: chronic illness Signs/Symptoms: severe fat depletion, severe muscle depletion Interventions: Tube feeding, Prostat, MVI   Pressure Injury 05/11/21 Sacrum Medial Stage 2 -  Partial thickness loss of dermis presenting as a shallow open injury with a red, pink wound bed without slough. (Active)  05/11/21 1500  Location: Sacrum  Location Orientation: Medial  Staging: Stage 2 -  Partial thickness loss of dermis presenting as a shallow open injury with a red, pink wound bed without slough.  Wound Description (Comments):   Present on Admission: Yes       Family Communication/Anticipated D/C date and plan/Code Status   Code Status: DNR Family Communication:at bedside Disposition Plan: Status is: Inpatient  Remains inpatient appropriate because: will need to return to SNF when no longer on tube feeds- [facility] resident at [facility] Consultants:   [facility] Renal Palliative cre   Subjective:   Not as verbal today  Objective:    Vitals:   05/15/21 0215 05/15/21 0338 05/15/21 0740 05/15/21 1109  BP: (!) 150/73 (!) 161/79 (!) 165/81 (!) 158/83  Pulse: 98 98 98 98  Resp: (!) 24 17 20  (!) 21  Temp:  99.3 F (37.4 C) 99.8 F (37.7 C) 98.7 F (37.1 C)  TempSrc:  Oral Axillary Oral  SpO2: 91% 93% 91%  92%  Weight:        Intake/Output Summary (Last 24 hours) at 05/15/2021 1514 Last data filed at 05/15/2021 0845 Gross per 24 hour  Intake 360 ml  Output 0 ml  Net 360 ml   Filed Weights   05/12/21 0823  Weight: 46.3 kg    Exam:  General: Appearance:    Thin very frail female in no acute distress     Lungs:      respirations unlabored  Heart:    Normal heart rate.   MS:   All extremities are intact.  Swelling of left arm with some  mild redness   Neurologic:   Awake, alert- will speak but appears more fatigued today     Data Reviewed:   I have personally reviewed following labs and imaging studies:  Labs: Labs show the following:   Basic Metabolic Panel: Recent Labs  Lab 05/10/21 0858 05/11/21 0456 05/11/21 1656 05/12/21 0611 05/12/21 1619 05/13/21 0600 05/14/21 0409  NA 133* 131*  --  133*  --  128* 134*  K 3.7 3.3*  --  3.5  --  4.1 4.7  CL 99 96*  --  99  --  95* 99  CO2 20* 24  --  25  --  23 27  GLUCOSE 103* 78  --  116*  --  118* 111*  BUN 41* 17  --  27*  --  52* 31*  CREATININE 4.68* 2.73*  --  2.23*  --  3.08* 1.93*  CALCIUM 9.0 8.7*  --  8.6*  --  8.7* 8.9  MG  --  1.8 1.8 1.9 1.9 1.8 1.9  PHOS 4.8* 4.3 3.3 3.5 3.4 3.4 2.5   GFR Estimated Creatinine Clearance: 19.3 mL/min (A) (by C-G formula based on SCr of 1.93 mg/dL (H)). Liver Function Tests: Recent Labs  Lab 05/10/21 0858  ALBUMIN 2.6*   No results for input(s): LIPASE, AMYLASE in the last 168 hours. Recent Labs  Lab 05/08/21 1638  AMMONIA 30   Coagulation profile Recent Labs  Lab 05/09/21 0447 05/10/21 0653 05/11/21 0456 05/14/21 0409  INR 4.0* 5.2* 1.5* 1.5*    CBC: Recent Labs  Lab 05/10/21 0858 05/11/21 0456 05/13/21 0600 05/13/21 1034 05/14/21 0409  WBC 22.8* 15.6* 12.2* 14.5* 7.9  HGB 9.8* 10.4* 10.4* 11.3* 9.9*  HCT 29.8* 31.4* 31.3* 35.4* 30.5*  MCV 91.4 91.8 91.8 93.2 93.0  PLT 307 213 241 256 227   Cardiac Enzymes: No results for input(s): CKTOTAL, CKMB, CKMBINDEX, TROPONINI in the last 168 hours. BNP (last 3 results) No results for input(s): PROBNP in the last 8760 hours. CBG: Recent Labs  Lab 05/14/21 1937 05/14/21 2342 05/15/21 0336 05/15/21 0739 05/15/21 1200  GLUCAP 94 95 108* 88 76   D-Dimer: No results for input(s): DDIMER in the last 72 hours. Hgb A1c: No results for input(s): HGBA1C in the last 72 hours. Lipid Profile: No results for input(s): CHOL, HDL, LDLCALC, TRIG,  CHOLHDL, LDLDIRECT in the last 72 hours. Thyroid function studies: No results for input(s): TSH, T4TOTAL, T3FREE, THYROIDAB in the last 72 hours.  Invalid input(s): FREET3 Anemia work up: No results for input(s): VITAMINB12, FOLATE, FERRITIN, TIBC, IRON, RETICCTPCT in the last 72 hours. Sepsis Labs: Recent Labs  Lab 05/11/21 0456 05/13/21 0600 05/13/21 1034 05/14/21 0409  WBC 15.6* 12.2* 14.5* 7.9    Microbiology Recent Results (from the past 240 hour(s))  Resp Panel by RT-PCR (Flu A&B, Covid) Nasopharyngeal Swab  Status: None   Collection Time: 05/08/21  9:34 AM   Specimen: Nasopharyngeal Swab; Nasopharyngeal(NP) swabs in vial transport medium  Result Value Ref Range Status   SARS Coronavirus 2 by RT PCR NEGATIVE NEGATIVE Final    Comment: (NOTE) SARS-CoV-2 target nucleic acids are NOT DETECTED.  The SARS-CoV-2 RNA is generally detectable in upper respiratory specimens during the acute phase of infection. The lowest concentration of SARS-CoV-2 viral copies this assay can detect is 138 copies/mL. A negative result does not preclude SARS-Cov-2 infection and should not be used as the sole basis for treatment or other patient management decisions. A negative result may occur with  improper specimen collection/handling, submission of specimen other than nasopharyngeal swab, presence of viral mutation(s) within the areas targeted by this assay, and inadequate number of viral copies(<138 copies/mL). A negative result must be combined with clinical observations, patient history, and epidemiological information. The expected result is Negative.  Fact Sheet for Patients:  EntrepreneurPulse.com.au  Fact Sheet for Healthcare Providers:  IncredibleEmployment.be  This test is no t yet approved or cleared by the [place] FDA and  has been authorized for detection and/or diagnosis of SARS-CoV-2 by FDA under an Emergency Use Authorization  (EUA). This EUA will remain  in effect (meaning this test can be used) for the duration of the COVID-19 declaration under Section 564(b)(1) of the Act, 21 U.S.C.section 360bbb-3(b)(1), unless the authorization is terminated  or revoked sooner.       Influenza A by PCR NEGATIVE NEGATIVE Final   Influenza B by PCR NEGATIVE NEGATIVE Final    Comment: (NOTE) The Xpert Xpress SARS-CoV-2/FLU/RSV plus assay is intended as an aid in the diagnosis of influenza from Nasopharyngeal swab specimens and should not be used as a sole basis for treatment. Nasal washings and aspirates are unacceptable for Xpert Xpress SARS-CoV-2/FLU/RSV testing.  Fact Sheet for Patients: EntrepreneurPulse.com.au  Fact Sheet for Healthcare Providers: IncredibleEmployment.be  This test is not yet approved or cleared by the [place] FDA and has been authorized for detection and/or diagnosis of SARS-CoV-2 by FDA under an Emergency Use Authorization (EUA). This EUA will remain in effect (meaning this test can be used) for the duration of the COVID-19 declaration under Section 564(b)(1) of the Act, 21 U.S.C. section 360bbb-3(b)(1), unless the authorization is terminated or revoked.  Performed at [facility], [address] [place], [place] [address]   MRSA Next Gen by PCR, Nasal     Status: None   Collection Time: 05/09/21  3:11 PM   Specimen: Nasal Mucosa; Nasal Swab  Result Value Ref Range Status   MRSA by PCR Next Gen NOT DETECTED NOT DETECTED Final    Comment: (NOTE) The GeneXpert MRSA Assay (FDA approved for NASAL specimens only), is one component of a comprehensive MRSA colonization surveillance program. It is not intended to diagnose MRSA infection nor to guide or monitor treatment for MRSA infections. Test performance is not FDA approved in patients less than 19 years old. Performed at [facility], [place] [address], [place], [place] [address]    Urine Culture     Status: Abnormal   Collection Time: 05/09/21  3:11 PM   Specimen: Urine, Clean Catch  Result Value Ref Range Status   Specimen Description URINE, CLEAN CATCH  Final   Special Requests   Final    NONE Performed at [facility], [place] [address] [place], [place] [address]    Culture (A)  Final    >=100,000  COLONIES/mL ENTEROCOCCUS FAECALIS VANCOMYCIN RESISTANT ENTEROCOCCUS ISOLATED    Report Status 05/12/2021 FINAL  Final   Organism ID, Bacteria ENTEROCOCCUS FAECALIS (A)  Final      Susceptibility   Enterococcus faecalis - MIC*    AMPICILLIN <=2 SENSITIVE Sensitive     NITROFURANTOIN <=16 SENSITIVE Sensitive     VANCOMYCIN >=32 RESISTANT Resistant     GENTAMICIN SYNERGY RESISTANT Resistant     LINEZOLID 2 SENSITIVE Sensitive     * >=100,000 COLONIES/mL ENTEROCOCCUS FAECALIS    Procedures and diagnostic studies:  No results found.  Medications:    amoxicillin  500 mg Per Tube QHS   atorvastatin  40 mg Oral Daily   chlorhexidine  15 mL Mouth Rinse BID   Chlorhexidine Gluconate Cloth  6 each Topical Q0600   ezetimibe  10 mg Oral Daily   feeding supplement  1 Container Oral TID BM   feeding supplement (OSMOLITE 1.5 CAL)  1,000 mL Per Tube Q24H   feeding supplement (PROSource TF)  45 mL Per Tube TID   lacosamide  100 mg Per Tube BID   losartan  50 mg Oral Daily   mouth rinse  15 mL Mouth Rinse q12n4p   metoprolol tartrate  12.5 mg Per Tube BID   multivitamin  1 tablet Per Tube QHS   pantoprazole sodium  40 mg Per Tube QHS   sertraline  50 mg Oral Daily   valproic acid  500 mg Per Tube BID   warfarin  1 mg Oral q1600   Warfarin - Physician Dosing Inpatient   Does not apply q1600   Continuous Infusions:   LOS: 7 days   Geradine Girt  [facility]   How to contact the [facility] Attending or Consulting provider [place] or covering provider during after hours [place], for this patient?  Check the care team in [facility] and look for a)  attending/consulting TRH provider listed and b) the [facility] team listed Log into [email or web address] and use [place]'s universal password to access. If you do not have the password, please contact the hospital operator. Locate the [facility] provider you are looking for under [facility] and page to a number that you can be directly reached. If you still have difficulty reaching the provider, please page the [facility] (Director on Call) for the Hospitalists listed on amion for assistance.  05/15/2021, 3:14 PM

## 2021-05-15 NOTE — Progress Notes (Addendum)
Pt. Nocturnal feeding to have started at 18:00 05/14/21 at 65 mL/hr, Upon assessment @ 0534, tube feeding rate at 62ml/hr. Contacted on call to verify pt only received 31ml/ hr and Dr. Kennon Holter verbally given order to stop feed at [facility] as scheduled.

## 2021-05-15 NOTE — Progress Notes (Addendum)
Pt. Back on the floor pt. Vitals WDL

## 2021-05-15 NOTE — Progress Notes (Signed)
[name] Progress Note   Subjective:   pt seen in room, lethargic, no c/o's  Objective Vitals:   05/15/21 0338 05/15/21 0740 05/15/21 1109 05/15/21 1527  BP: (!) 161/79 (!) 165/81 (!) 158/83 (!) 182/93  Pulse: 98 98 98 98  Resp: 17 20 (!) 21 (!) 23  Temp: 99.3 F (37.4 C) 99.8 F (37.7 C) 98.7 F (37.1 C) 98.9 F (37.2 C)  TempSrc: Oral Axillary Oral Oral  SpO2: 93% 91% 92% 94%  Weight:       Physical Exam General:chronically ill , weak, responsive, cortrak w/ TF's Heart:RRR, no mrg Lungs: mostly CTAB anterolaterally, BS decreased, nml WOB on RA Abdomen:soft, NTND Extremities: 1+ edema in b/l UE, no LE edema Dialysis Access: TDC      CXR 1/07, 1/09 > large L > R pleural effusions   CT chest 1/06 - large L > R pleural effusions, no edema  OP HD: MWF [facility]  3h 66min  3590/500  48.5kg   TDC R  Hep 1600 + 600u/hr  - mircera 50 ug q 4wks  Assessment/Plan: Status epilecticus/ chronic SDH - improved, getting vimpat/ depakote through NG tube now. More alert. Per neuro/ pmd Acute cystitis - +VRE on culture, getting amoxicillin per tube x 5 days  ESRD - HD MWF. HD today is pending.  HD time will be shortened due to high census/ staffing issues.   Hypertension - BP's high on admission, SP cardene gtt. Metoprolol and losartan restarted per NG.  Vol overload  - CXR unchanged bilat effusions. Sig volume reduction here after HD x 3. 2kg below her dry wt. No sig excess now. Cont to lower vol as tolerated by BP  Anemia of CKD - Hgb 10.0.  Hold ESA for now and monitor labs.    Secondary Hyperparathyroidism -  Calcium in goal.  Not on binders as outpatient.   Nutrition - currently NPO  CAD/ hx prior DES/ hx ICM Leukocytosis - checking UA and cultures. Per PMD Hx SDH s/p craniotomy  Shingles - was on Valtrex prior to admission.  Kelly Splinter, MD 05/15/2021, 3:32 PM       Basic Metabolic Panel: Recent Labs  Lab 05/12/21 0611 05/12/21 1619  05/13/21 0600 05/14/21 0409  NA 133*  --  128* 134*  K 3.5  --  4.1 4.7  CL 99  --  95* 99  CO2 25  --  23 27  GLUCOSE 116*  --  118* 111*  BUN 27*  --  52* 31*  CREATININE 2.23*  --  3.08* 1.93*  CALCIUM 8.6*  --  8.7* 8.9  PHOS 3.5 3.4 3.4 2.5     CBC: Recent Labs  Lab 05/10/21 0858 05/11/21 0456 05/13/21 0600 05/13/21 1034 05/14/21 0409  WBC 22.8* 15.6* 12.2* 14.5* 7.9  HGB 9.8* 10.4* 10.4* 11.3* 9.9*  HCT 29.8* 31.4* 31.3* 35.4* 30.5*  MCV 91.4 91.8 91.8 93.2 93.0  PLT 307 213 241 256 227    Medications:    amoxicillin  500 mg Per Tube QHS   atorvastatin  40 mg Oral Daily   chlorhexidine  15 mL Mouth Rinse BID   Chlorhexidine Gluconate Cloth  6 each Topical Q0600   ezetimibe  10 mg Oral Daily   feeding supplement  1 Container Oral TID BM   feeding supplement (OSMOLITE 1.5 CAL)  1,000 mL Per Tube Q24H   feeding supplement (PROSource TF)  45 mL Per Tube TID   hydrALAZINE  10  mg Oral Q8H   lacosamide  100 mg Per Tube BID   losartan  50 mg Oral Daily   mouth rinse  15 mL Mouth Rinse q12n4p   metoprolol tartrate  12.5 mg Per Tube BID   multivitamin  1 tablet Per Tube QHS   pantoprazole sodium  40 mg Per Tube QHS   sertraline  50 mg Oral Daily   valproic acid  500 mg Per Tube BID   warfarin  1 mg Oral q1600   Warfarin - Physician Dosing Inpatient   Does not apply [id number]

## 2021-05-15 NOTE — Progress Notes (Signed)
Brief Nutrition Note  Per RN note earlier this morning, nocturnal tube feeds of Osmolite 1.5 were infusing at 40 ml/hr rather than ordered rate of 65 ml/hr.  Spoke with pt, pt's husband, and pt's daughter at bedside regarding PO intake since tube feeds were transitioned to nocturnal yesterday. Pt's family reports that pt ate a few bites of lunch and dinner yesterday after tube feeds were turned off. They report that pt was not very hungry for breakfast this morning and therefore did not eat much. Family thought that this might be because pt received tube feeds overnight. Discussed with family that tube feeds were not running at ordered rate of 65 ml/hr and instead running at 40 ml/hr (~60% of rate that was ordered).  Noted meal completions of 10% documented for lunch and dinner meals yesterday. Family states that pt does like the Boost Breeze supplements and that they have been encouraging pt to consume these. Also noted that pt likes chocolate milk so will add this as a supplement to come with all meal trays.  RD to adjust nocturnal tube feeding regimen to meet ~75% of pt's estimated kcal needs: - Osmolite 1.5 @ 55 ml/hr x 12 hours from 1800 to 0600 (total of 660 ml) - ProSource TF 45 ml TID   Updated nocturnal tube feeding regimen provides 1110 kcal, 78 grams of protein, and 504 ml of H2O (meets 74% of minium kcal needs and 100% of minimum protein needs).   Gustavus Bryant, MS, RD, LDN Inpatient Clinical Dietitian Please see AMiON for contact information.

## 2021-05-16 ENCOUNTER — Encounter (HOSPITAL_COMMUNITY): Payer: Medicare Other

## 2021-05-16 LAB — GLUCOSE, CAPILLARY
Glucose-Capillary: 100 mg/dL — ABNORMAL HIGH (ref 70–99)
Glucose-Capillary: 104 mg/dL — ABNORMAL HIGH (ref 70–99)
Glucose-Capillary: 107 mg/dL — ABNORMAL HIGH (ref 70–99)
Glucose-Capillary: 82 mg/dL (ref 70–99)
Glucose-Capillary: 97 mg/dL (ref 70–99)
Glucose-Capillary: 98 mg/dL (ref 70–99)

## 2021-05-16 NOTE — Progress Notes (Signed)
[facility] Progress Note   Subjective:   pt seen in room, lethargic, minimal verbal  Objective Vitals:   05/16/21 0347 05/16/21 0617 05/16/21 0731 05/16/21 1121  BP:  (!) 153/77 (!) 150/83 (!) 153/74  Pulse:   94 86  Resp:   15 20  Temp:   98.9 F (37.2 C) 98 F (36.7 C)  TempSrc:   Axillary Axillary  SpO2:   96% 96%  Weight: 47.7 kg      Physical Exam General:chronically ill , cortrak w/ TF's Heart:RRR, no mrg Lungs: mostly CTAB anterolaterally, BS decreased, nml WOB on RA Abdomen:soft, NTND Extremities: 1+ edema in b/l UE, no LE edema Dialysis Access: TDC      CXR 1/07, 1/09 > large L > R pleural effusions   CT chest 1/06 - large L > R pleural effusions, no edema  OP HD: MWF [facility]  3h 35min  3590/500  48.5kg   TDC R  Hep 1600 + 600u/hr  - mircera 50 ug q 4wks  Assessment/Plan: Status epilecticus/ chronic SDH - improved, getting vimpat/ depakote through NG tube now. More alert. Per neuro/ pmd Acute cystitis - +VRE on culture, getting amoxicillin per tube x 5 days  ESRD - HD MWF.   Hypertension - BP's high on admission, SP cardene gtt. Metoprolol and losartan restarted per NG.  Vol overload  - CXR unchanged bilat effusions. Sig volume reduction here after HD x 3. 2kg below her dry wt. No sig excess now. Cont to lower vol as tolerated by BP  Anemia of CKD - Hgb 10.0.  Hold ESA for now and monitor labs.    Secondary Hyperparathyroidism -  Calcium in goal.  Not on binders as outpatient.   Nutrition - currently NPO  CAD/ hx prior DES/ hx ICM Leukocytosis - checking UA and cultures. Per PMD Hx SDH s/p craniotomy  EOL / FTT - I have watched her decline over the past 2 years. She recently went back to SNF, this time "for good", where they were told, "she'd never walk again". She will not get better. I had a long discussion w/ dtr and husband. Further dialysis will not improve her situation and will only prolong her suffering. Recommend no further  dialysis and transition to comfort care at this time.   Kelly Splinter, MD 05/16/2021, 12:27 PM       Basic Metabolic Panel: Recent Labs  Lab 05/12/21 0611 05/12/21 1619 05/13/21 0600 05/14/21 0409  NA 133*  --  128* 134*  K 3.5  --  4.1 4.7  CL 99  --  95* 99  CO2 25  --  23 27  GLUCOSE 116*  --  118* 111*  BUN 27*  --  52* 31*  CREATININE 2.23*  --  3.08* 1.93*  CALCIUM 8.6*  --  8.7* 8.9  PHOS 3.5 3.4 3.4 2.5     CBC: Recent Labs  Lab 05/10/21 0858 05/11/21 0456 05/13/21 0600 05/13/21 1034 05/14/21 0409  WBC 22.8* 15.6* 12.2* 14.5* 7.9  HGB 9.8* 10.4* 10.4* 11.3* 9.9*  HCT 29.8* 31.4* 31.3* 35.4* 30.5*  MCV 91.4 91.8 91.8 93.2 93.0  PLT 307 213 241 256 227    Medications:    amoxicillin  500 mg Per Tube QHS   atorvastatin  40 mg Oral Daily   chlorhexidine  15 mL Mouth Rinse BID   Chlorhexidine Gluconate Cloth  6 each Topical Q0600   ezetimibe  10 mg Oral Daily  feeding supplement  1 Container Oral TID BM   feeding supplement (OSMOLITE 1.5 CAL)  1,000 mL Per Tube Q24H   feeding supplement (PROSource TF)  45 mL Per Tube TID   hydrALAZINE  10 mg Oral Q8H   lacosamide  100 mg Per Tube BID   losartan  50 mg Oral Daily   mouth rinse  15 mL Mouth Rinse q12n4p   metoprolol tartrate  12.5 mg Per Tube BID   multivitamin  1 tablet Per Tube QHS   pantoprazole sodium  40 mg Per Tube QHS   sertraline  50 mg Oral Daily   valproic acid  500 mg Per Tube BID   warfarin  1 mg Oral q1600   Warfarin - Physician Dosing Inpatient   Does not apply [id number]

## 2021-05-16 NOTE — Progress Notes (Addendum)
Progress Note    Amy [name]  ERD:[id number] DOB: 08/05/1950  DOA: 05/08/2021 PCP: Murlean Iba, MD    Brief Narrative:     Medical records reviewed and are as summarized below:  Amy [name] is an 71 y.o. female with hx of subdural hematoma and prior seizures who presents from SNF with decreased responsiveness.  Evaluated by neurology at [facility] who evaluated a spot EEG that raised concern for ongoing seizures so transferred to [facility] for formal management.   was in the ICU for a prolonged time.  Very slow to show improvement.  Will need to be able to eat/sit in chair for HD.    Update: family spoke with Dr. Jonnie Finner.  Plan is to with help from palliative care transition to comfort and stop HD.   Assessment/Plan:   Principal Problem:   Seizure ([place]) Active Problems:   Acute metabolic encephalopathy   ESRD (end stage renal disease) on dialysis ([place])   Secondary hypertension   Status epilepticus Chronic subdural hematoma ~9 mo ago - Continue Vimpat/depakote per tube until able to take PO   Hx of CAD w/ prior DES Hx of ischemic cardiomyopathy - per neuro; SBP goal 140-180 - metoprolol/losartan, zetia, statin.  -resume home hydralazine at lower dose  Left arm swelling -check duplex to r/o DVT -elevate extremity  Acute on chronic pleural effusion: large left and moderate right. Have been very slowly progressive since the time of her SDH and functional decline. Likely from volume overload +/- nutritional deficiencies. -volume with HD   ESRD on HD dialysis MWF - Dialysis per nephrology   Hyponatremia: hx of hyponatremia -mild   Supratherapeutic INR: 5.2 on admission. Warfarin use for HD catheter patency.  Per family she is on low-dose Coumadin to prevent catheter clotting. - Restart home warfarin 1mg  daily.  - Intermittent INR- defer to nephrology   Acute cystitis: present on admission. Cultures growing VRE. Patient had complained of dysuria to her daughter in the  days leading to her presentation.  - Amoxicillin per tube for 5 total days through 1/14   Severe malnutrition, present on admission - nocturnal tube feeds, allow to eat during the day - SLP eval ongoing   Thyroid nodules - OP follow-up pending clinical course.    Goals of care - DNR, palliative care following. Appreciate their assistance.  -I have concern that she is not advancing   Nutrition Status: Nutrition Problem: Severe Malnutrition Etiology: chronic illness Signs/Symptoms: severe fat depletion, severe muscle depletion Interventions: Tube feeding, Prostat, MVI   Pressure Injury 05/11/21 Sacrum Medial Stage 2 -  Partial thickness loss of dermis presenting as a shallow open injury with a red, pink wound bed without slough. (Active)  05/11/21 1500  Location: Sacrum  Location Orientation: Medial  Staging: Stage 2 -  Partial thickness loss of dermis presenting as a shallow open injury with a red, pink wound bed without slough.  Wound Description (Comments):   Present on Admission: Yes       Family Communication/Anticipated D/C date and plan/Code Status   Code Status: DNR Family Communication:at bedside Disposition Plan: Status is: Inpatient  Remains inpatient appropriate because: will need to return to SNF when no longer on tube feeds and able to sit in chair for HD-- long term resident at [facility] Consultants:   Pccm Renal Palliative cre   Subjective:   Continues to seem very withdrawn  Objective:    Vitals:  05/16/21 0347 05/16/21 0617 05/16/21 0731 05/16/21 1121  BP:  (!) 153/77 (!) 150/83 (!) 153/74  Pulse:   94 86  Resp:   15 20  Temp:   98.9 F (37.2 C) 98 F (36.7 C)  TempSrc:   Axillary Axillary  SpO2:   96% 96%  Weight: 47.7 kg       Intake/Output Summary (Last 24 hours) at 05/16/2021 1217 Last data filed at 05/16/2021 0854 Gross per 24 hour  Intake 240 ml  Output 1500 ml  Net -1260 ml   Filed Weights    05/15/21 1924 05/15/21 2139 05/16/21 0347  Weight: 47 kg 46 kg 47.7 kg    Exam:  General: Appearance:    Thin female in no acute distress     Lungs:     respirations unlabored  Heart:    Normal heart rate. .   MS:   All extremities are intact.    Neurologic:   Awake, alert, poor eye contact       Data Reviewed:   I have personally reviewed following labs and imaging studies:  Labs: Labs show the following:   Basic Metabolic Panel: Recent Labs  Lab 05/10/21 0858 05/10/21 0858 05/11/21 0456 05/11/21 1656 05/12/21 0611 05/12/21 1619 05/13/21 0600 05/14/21 0409  NA 133*  --  131*  --  133*  --  128* 134*  K 3.7  --  3.3*  --  3.5  --  4.1 4.7  CL 99  --  96*  --  99  --  95* 99  CO2 20*  --  24  --  25  --  23 27  GLUCOSE 103*  --  78  --  116*  --  118* 111*  BUN 41*  --  17  --  27*  --  52* 31*  CREATININE 4.68*  --  2.73*  --  2.23*  --  3.08* 1.93*  CALCIUM 9.0  --  8.7*  --  8.6*  --  8.7* 8.9  MG  --    < > 1.8 1.8 1.9 1.9 1.8 1.9  PHOS 4.8*  --  4.3 3.3 3.5 3.4 3.4 2.5   < > = values in this interval not displayed.   GFR Estimated Creatinine Clearance: 19.8 mL/min (A) (by C-G formula based on SCr of 1.93 mg/dL (H)). Liver Function Tests: Recent Labs  Lab 05/10/21 0858  ALBUMIN 2.6*   No results for input(s): LIPASE, AMYLASE in the last 168 hours. No results for input(s): AMMONIA in the last 168 hours.  Coagulation profile Recent Labs  Lab 05/10/21 0653 05/11/21 0456 05/14/21 0409  INR 5.2* 1.5* 1.5*    CBC: Recent Labs  Lab 05/10/21 0858 05/11/21 0456 05/13/21 0600 05/13/21 1034 05/14/21 0409  WBC 22.8* 15.6* 12.2* 14.5* 7.9  HGB 9.8* 10.4* 10.4* 11.3* 9.9*  HCT 29.8* 31.4* 31.3* 35.4* 30.5*  MCV 91.4 91.8 91.8 93.2 93.0  PLT 307 213 241 256 227   Cardiac Enzymes: No results for input(s): CKTOTAL, CKMB, CKMBINDEX, TROPONINI in the last 168 hours. BNP (last 3 results) No results for input(s): PROBNP in the last 8760  hours. CBG: Recent Labs  Lab 05/15/21 1526 05/15/21 2305 05/16/21 0335 05/16/21 0730 05/16/21 1123  GLUCAP 84 126* 107* 82 100*   D-Dimer: No results for input(s): DDIMER in the last 72 hours. Hgb A1c: No results for input(s): HGBA1C in the last 72 hours. Lipid Profile: No results for input(s): CHOL, HDL, LDLCALC, TRIG,  CHOLHDL, LDLDIRECT in the last 72 hours. Thyroid function studies: No results for input(s): TSH, T4TOTAL, T3FREE, THYROIDAB in the last 72 hours.  Invalid input(s): FREET3 Anemia work up: No results for input(s): VITAMINB12, FOLATE, FERRITIN, TIBC, IRON, RETICCTPCT in the last 72 hours. Sepsis Labs: Recent Labs  Lab 05/11/21 0456 05/13/21 0600 05/13/21 1034 05/14/21 0409  WBC 15.6* 12.2* 14.5* 7.9    Microbiology Recent Results (from the past 240 hour(s))  Resp Panel by RT-PCR (Flu A&B, Covid) Nasopharyngeal Swab     Status: None   Collection Time: 05/08/21  9:34 AM   Specimen: Nasopharyngeal Swab; Nasopharyngeal(NP) swabs in vial transport medium  Result Value Ref Range Status   SARS Coronavirus 2 by RT PCR NEGATIVE NEGATIVE Final    Comment: (NOTE) SARS-CoV-2 target nucleic acids are NOT DETECTED.  The SARS-CoV-2 RNA is generally detectable in upper respiratory specimens during the acute phase of infection. The lowest concentration of SARS-CoV-2 viral copies this assay can detect is 138 copies/mL. A negative result does not preclude SARS-Cov-2 infection and should not be used as the sole basis for treatment or other patient management decisions. A negative result may occur with  improper specimen collection/handling, submission of specimen other than nasopharyngeal swab, presence of viral mutation(s) within the areas targeted by this assay, and inadequate number of viral copies(<138 copies/mL). A negative result must be combined with clinical observations, patient history, and epidemiological information. The expected result is Negative.  Fact  Sheet for Patients:  EntrepreneurPulse.com.au  Fact Sheet for Healthcare Providers:  IncredibleEmployment.be  This test is no t yet approved or cleared by the [place] FDA and  has been authorized for detection and/or diagnosis of SARS-CoV-2 by FDA under an Emergency Use Authorization (EUA). This EUA will remain  in effect (meaning this test can be used) for the duration of the COVID-19 declaration under Section 564(b)(1) of the Act, 21 U.S.C.section 360bbb-3(b)(1), unless the authorization is terminated  or revoked sooner.       Influenza A by PCR NEGATIVE NEGATIVE Final   Influenza B by PCR NEGATIVE NEGATIVE Final    Comment: (NOTE) The Xpert Xpress SARS-CoV-2/FLU/RSV plus assay is intended as an aid in the diagnosis of influenza from Nasopharyngeal swab specimens and should not be used as a sole basis for treatment. Nasal washings and aspirates are unacceptable for Xpert Xpress SARS-CoV-2/FLU/RSV testing.  Fact Sheet for Patients: EntrepreneurPulse.com.au  Fact Sheet for Healthcare Providers: IncredibleEmployment.be  This test is not yet approved or cleared by the [place] FDA and has been authorized for detection and/or diagnosis of SARS-CoV-2 by FDA under an Emergency Use Authorization (EUA). This EUA will remain in effect (meaning this test can be used) for the duration of the COVID-19 declaration under Section 564(b)(1) of the Act, 21 U.S.C. section 360bbb-3(b)(1), unless the authorization is terminated or revoked.  Performed at [facility], [address] [place], [place] [address]   MRSA Next Gen by PCR, Nasal     Status: None   Collection Time: 05/09/21  3:11 PM   Specimen: Nasal Mucosa; Nasal Swab  Result Value Ref Range Status   MRSA by PCR Next Gen NOT DETECTED NOT DETECTED Final    Comment: (NOTE) The GeneXpert MRSA Assay (FDA approved for NASAL specimens  only), is one component of a comprehensive MRSA colonization surveillance program. It is not intended to diagnose MRSA infection nor to guide or monitor treatment for MRSA infections. Test performance is not FDA approved in patients less than  59 years old. Performed at [facility], [place] [address], [place], [place] [address]   Urine Culture     Status: Abnormal   Collection Time: 05/09/21  3:11 PM   Specimen: Urine, Clean Catch  Result Value Ref Range Status   Specimen Description URINE, CLEAN CATCH  Final   Special Requests   Final    NONE Performed at [facility], [place] [address] [place], [place] [address]    Culture (A)  Final    >=100,000 COLONIES/mL ENTEROCOCCUS FAECALIS VANCOMYCIN RESISTANT ENTEROCOCCUS ISOLATED    Report Status 05/12/2021 FINAL  Final   Organism ID, Bacteria ENTEROCOCCUS FAECALIS (A)  Final      Susceptibility   Enterococcus faecalis - MIC*    AMPICILLIN <=2 SENSITIVE Sensitive     NITROFURANTOIN <=16 SENSITIVE Sensitive     VANCOMYCIN >=32 RESISTANT Resistant     GENTAMICIN SYNERGY RESISTANT Resistant     LINEZOLID 2 SENSITIVE Sensitive     * >=100,000 COLONIES/mL ENTEROCOCCUS FAECALIS    Procedures and diagnostic studies:  No results found.  Medications:    amoxicillin  500 mg Per Tube QHS   atorvastatin  40 mg Oral Daily   chlorhexidine  15 mL Mouth Rinse BID   Chlorhexidine Gluconate Cloth  6 each Topical Q0600   ezetimibe  10 mg Oral Daily   feeding supplement  1 Container Oral TID BM   feeding supplement (OSMOLITE 1.5 CAL)  1,000 mL Per Tube Q24H   feeding supplement (PROSource TF)  45 mL Per Tube TID   hydrALAZINE  10 mg Oral Q8H   lacosamide  100 mg Per Tube BID   losartan  50 mg Oral Daily   mouth rinse  15 mL Mouth Rinse q12n4p   metoprolol tartrate  12.5 mg Per Tube BID   multivitamin  1 tablet Per Tube QHS   pantoprazole sodium  40 mg Per Tube QHS   sertraline  50 mg Oral Daily   valproic acid  500 mg Per Tube BID    warfarin  1 mg Oral q1600   Warfarin - Physician Dosing Inpatient   Does not apply q1600   Continuous Infusions:   LOS: 8 days   Geradine Girt  [facility]   How to contact the [facility] Attending or Consulting provider [place] or covering provider during after hours [place], for this patient?  Check the care team in [facility] and look for a) attending/consulting TRH provider listed and b) the [facility] team listed Log into [email or web address] and use [place]'s universal password to access. If you do not have the password, please contact the hospital operator. Locate the [facility] provider you are looking for under [facility] and page to a number that you can be directly reached. If you still have difficulty reaching the provider, please page the [facility] (Director on Call) for the Hospitalists listed on amion for assistance.  05/16/2021, 12:17 PM

## 2021-05-17 ENCOUNTER — Encounter (HOSPITAL_COMMUNITY): Payer: Medicare Other

## 2021-05-17 DIAGNOSIS — Z66 Do not resuscitate: Secondary | ICD-10-CM

## 2021-05-17 DIAGNOSIS — Z992 Dependence on renal dialysis: Secondary | ICD-10-CM

## 2021-05-17 LAB — GLUCOSE, CAPILLARY
Glucose-Capillary: 105 mg/dL — ABNORMAL HIGH (ref 70–99)
Glucose-Capillary: 103 mg/dL — ABNORMAL HIGH (ref 70–99)

## 2021-05-17 MED ORDER — GLYCOPYRROLATE 1 MG PO TABS: 1.0000 mg | ORAL_TABLET | ORAL | Status: DC | PRN

## 2021-05-17 MED ORDER — HALOPERIDOL LACTATE 2 MG/ML PO CONC
0.5000 mg | ORAL | Status: DC | PRN
  Filled 2021-05-17: qty 0.3

## 2021-05-17 MED ORDER — GLYCOPYRROLATE 0.2 MG/ML IJ SOLN
0.2000 mg | INTRAMUSCULAR | Status: DC | PRN
  Administered 2021-05-18: 0.2 mg via INTRAVENOUS
  Filled 2021-05-17: qty 1

## 2021-05-17 MED ORDER — HALOPERIDOL LACTATE 5 MG/ML IJ SOLN: 0.5000 mg | INTRAMUSCULAR | Status: DC | PRN

## 2021-05-17 MED ORDER — BIOTENE DRY MOUTH MT LIQD: 15.0000 mL | OROMUCOSAL | Status: DC | PRN

## 2021-05-17 MED ORDER — HYDROMORPHONE HCL 1 MG/ML IJ SOLN
0.5000 mg | INTRAMUSCULAR | Status: DC | PRN
  Administered 2021-05-18: 1 mg via INTRAVENOUS
  Filled 2021-05-17: qty 1

## 2021-05-17 MED ORDER — LORAZEPAM 2 MG/ML IJ SOLN: 0.5000 mg | INTRAMUSCULAR | Status: DC | PRN

## 2021-05-17 MED ORDER — GLYCOPYRROLATE 0.2 MG/ML IJ SOLN: 0.2000 mg | INTRAMUSCULAR | Status: DC | PRN

## 2021-05-17 MED ORDER — ONDANSETRON HCL 4 MG/2ML IJ SOLN: 4.0000 mg | Freq: Four times a day (QID) | INTRAMUSCULAR | Status: DC | PRN

## 2021-05-17 MED ORDER — HALOPERIDOL 1 MG PO TABS: 0.5000 mg | ORAL_TABLET | ORAL | Status: DC | PRN

## 2021-05-17 MED ORDER — ONDANSETRON 4 MG PO TBDP: 4.0000 mg | ORAL_TABLET | Freq: Four times a day (QID) | ORAL | Status: DC | PRN

## 2021-05-17 MED ORDER — POLYVINYL ALCOHOL 1.4 % OP SOLN
1.0000 [drp] | Freq: Four times a day (QID) | OPHTHALMIC | Status: DC | PRN
  Filled 2021-05-17: qty 15

## 2021-05-17 NOTE — TOC Progression Note (Signed)
Transition of Care [facility]) - Progression Note    Patient Details  Name: Amy [name] MRN: [id number] Date of Birth: 10-16-1950  Transition of Care [facility]) CM/SW [place], [place] Phone Number: 05/17/2021, 12:06 PM  Clinical Narrative:    CSW informed by attending physician and palliative care family and patient are changing to a comfort care course. CSW spoke with spouse and daughter with palliative and noted preference for [facility] home. CSW answered family's questions about the process. CSW spoke with liaison at [facility] who will initiate the referral process but notes likely no bed available today.    Expected Discharge Plan: [place] Barriers to Discharge: Hospice Bed not available  Expected Discharge Plan and Services Expected Discharge Plan: [place]       Living arrangements for the past 2 months: [place]                                       Social Determinants of Health (SDOH) Interventions    Readmission Risk Interventions Readmission Risk Prevention Plan 12/25/2020 11/09/2020 06/24/2020  Transportation Screening Complete Complete Complete  PCP or Specialist Appt within 3-5 Days - - Complete  HRI or [place] - - Complete  Social Work Consult for [place] Planning/Counseling - - Complete  Palliative Care Screening - - Not Applicable  Medication Review Press photographer) Complete Complete Complete  PCP or Specialist appointment within 3-5 days of discharge Complete Complete -  [place] or Home Care Consult - Complete -  SW Recovery Care/Counseling Consult Complete Complete -  Palliative Care Screening Not Applicable Not Applicable -  Skilled Nursing Facility Complete Complete -  Some recent data might be hidden

## 2021-05-17 NOTE — Plan of Care (Signed)

## 2021-05-17 NOTE — Progress Notes (Signed)
Palliative Medicine Inpatient Follow Up Note  HPI: 71 year old woman with hx of subdural hematoma and prior seizures, HTN, GERD, anxiety, ESRD on HD, iCMY, CAD w. DES. Angle presents from [facility] with decreased responsiveness.  Evaluated by neurology at [facility] who evaluated a spot EEG that raised concern for ongoing seizures so transferred to [facility] for formal management.  History per chart review as patient is encephalopathic.   Other workup in ER revealed slightly high INR, new large left pleural effusion.   Palliative care was consulted in the setting of a consistent decline to further discuss goals of care with Amy [name]'s family. It appears she has had three inpatient admissions and one ER visit in the past six months.   Today's Discussion (05/17/2021):  *Please note that this is a verbal dictation therefore any spelling or grammatical errors are due to the "[place]" system interpretation.  Chart reviewed inclusive of progress notes, laboratory results, and diagnostic images.   Family meeting held with patient's spouse Amy [name] and daughter Amy [name].  Reviewed patient's hospital course over the past week and that Amy [name] has unfortunately not improved greatly.  Amy [name] was able to share with me that per discussion with Dr. Jonnie Finner yesterday she is no longer a good candidate for dialysis.  We reviewed that in lieu of this the goal for Amy [name] will be comfort focused care. We talked about transition to comfort measures in house and what that would entail inclusive of medications to control pain, dyspnea, agitation, nausea, itching, and hiccups.    We discussed stopping all uneccessary measures such as HD, coretrack, cardiac monitoring, blood draws, needle sticks, and frequent vital signs.   Utilized reflective listening throughout our time together.   Created space and opportunity for patient to explore thoughts feelings and fears regarding current medical situation.  Patient's family  expressed concern that Amy [name] did not understand what was going on and asked when the right time to speak to her about all of this was.  I shared that I be happy to talk to her about prior conversations and expressed to her the direction her care was having.  Patient's family was quite appreciative of this. _________________________________________ I was able to meet Amy [name] at bedside and share with her the above information.  She seemed to express some degree of understanding through lifting her left eyebrow though how much was understood is uncertain.  I was able to leave the room and share this information with Amy [name]'s family.  _________________________________________  Discussed the plan for transition to an inpatient hospice home where Amy [name] will get the most comprehensive symptom management.  Reviewed that she will no longer receiving hemodialysis her time would be very short more than likely less than 2 weeks.  Patient's family was very understanding of this.  They are interested in hospice home in [place] which medical social worker, Amy [name] was made aware of.  Questions and concerns addressed   Palliative Support Provided.   Objective Assessment: Vital Signs Vitals:   05/17/21 0342 05/17/21 0748  BP: (!) 150/75 (!) 173/82  Pulse: 89 95  Resp: (!) 22 20  Temp: 98.7 F (37.1 C) 98.6 F (37 C)  SpO2: 97% 96%   No intake or output data in the 24 hours ending 05/17/21 1441 Last Weight  Most recent update: 05/17/2021  6:56 AM    Weight  47 kg (103 lb 9.9 oz)            Gen: Exceptionally frail elderly  female appears far older than stated age 29: moist mucous membranes CV: Regular rate and rhythm PULM: On room air ABD: Not distended EXT: No edema  Neuro: Somnolent/lethargic   SUMMARY OF RECOMMENDATIONS   DNAR/DNI  Comfort measures  Comfort medications per [facility]  Discontinue nasogastric tube  Discontinue telemetry monitoring  Unrestricted  visitation  Appreciate transitions of care team helping to coordinate inpatient hospice at hospice home in [place]   Ongoing support by the palliative care team while patient remains in house  Prognosis < 2 weeks stopped HD  Time Spent: 65  MDM - 4  Medical Decision Making:4 #/Complex Problems: 4                     Data Reviewed:    4             Management: 4 (1-Straightforward, 2-Low, 3-Moderate, 4-High) ______________________________________________________________________________________ [place] Palliative Medicine Team Team Cell Phone: [phone] Please utilize secure chat with additional questions, if there is no response within 30 minutes please call the above phone number  Palliative Medicine Team providers are available by phone from 7am to 7pm daily and can be reached through the team cell phone.  Should this patient require assistance outside of these hours, please call the patient's attending physician.

## 2021-05-17 NOTE — Progress Notes (Signed)
°  [facility] Progress Note   Subjective:   pt seen in room  Objective Vitals:   05/16/21 2333 05/17/21 0342 05/17/21 0500 05/17/21 0748  BP: (!) 162/80 (!) 150/75  (!) 173/82  Pulse: 88 89  95  Resp: 20 (!) 22  20  Temp: 99.1 F (37.3 C) 98.7 F (37.1 C)  98.6 F (37 C)  TempSrc: Oral Oral  Oral  SpO2: 96% 97%  96%  Weight:   47 kg    Physical Exam General:chronically ill , cortrak w/ TF's Heart:RRR, no mrg Lungs: mostly CTAB anterolaterally, BS decreased, nml WOB on RA Abdomen:soft, NTND Extremities: 1+ edema in b/l UE, no LE edema Dialysis Access: [facility]   Assessment/Plan: Status epilecticus/ chronic SDH - vimpat/ depakote by tube CAD/ hx prior DES/ hx ICM Hx SDH s/p craniotomy  ESRD - on hemodialysis  EOL / FTT - progressive FTT, no further HD. Is now transitioning to comfort care. Have d/w pmd. Will sign off.   Kelly Splinter, MD 05/17/2021, 10:59 AM

## 2021-05-17 NOTE — Progress Notes (Signed)
At [place] this RN and the NT were cleaning the patient up, after laying flat for an extended period of time, with feeds help, she vomited a small amount, tube feeds held, will report to oncoming nurse

## 2021-05-17 NOTE — Progress Notes (Addendum)
Progress Note    DEMONICA FARREY  NLZ:[id number] DOB: [birth year]  DOA: 05/08/2021 PCP: Murlean Iba, MD    Brief Narrative:     Medical records reviewed and are as summarized below:  DUSTIE BRITTLE is an 71 y.o. female with hx of subdural hematoma and prior seizures who presents from SNF with decreased responsiveness.  Evaluated by neurology at [facility] who evaluated a spot EEG that raised concern for ongoing seizures so transferred to [facility] for formal management.   was in the ICU for a prolonged time.     Update: family spoke with Dr. Jonnie Finner.  Plan is to with help from palliative care transition to comfort and stop HD. Plan to go to residential hospice   Assessment/Plan:   Principal Problem:   Seizure [facility]) Active Problems:   Acute metabolic encephalopathy   ESRD (end stage renal disease) on dialysis ([place])   Secondary hypertension   Status epilepticus Chronic subdural hematoma ~9 mo ago --transitioning to comfort care  Hx of CAD w/ prior DES Hx of ischemic cardiomyopathy Left arm swelling Acute on chronic pleural effusion:   ESRD on HD dialysis MWF Hyponatremia: hx of hyponatremia -mild Supratherapeutic INR:  Acute cystitis:   Severe malnutrition, present on admission Thyroid nodules Goals of care - DNR, palliative care following- now comfort care   Nutrition Status: Nutrition Problem: Severe Malnutrition Etiology: chronic illness Signs/Symptoms: severe fat depletion, severe muscle depletion Interventions: Tube feeding, Prostat, MVI   Pressure Injury 05/11/21 Sacrum Medial Stage 2 -  Partial thickness loss of dermis presenting as a shallow open injury with a red, pink wound bed without slough. (Active)  05/11/21 1500  Location: Sacrum  Location Orientation: Medial  Staging: Stage 2 -  Partial thickness loss of dermis presenting as a shallow open injury with a red, pink wound bed without slough.  Wound Description (Comments):   Present on Admission: Yes        Family Communication/Anticipated D/C date and plan/Code Status   Code Status: DNR Family Communication:at bedside Disposition Plan: Status is: Inpatient          Medical Consultants:   [facility] Palliative [facility]   Subjective:   Transitioning to comfort care  Objective:    Vitals:   05/16/21 2333 05/17/21 0342 05/17/21 0500 05/17/21 0748  BP: (!) 162/80 (!) 150/75  (!) 173/82  Pulse: 88 89  95  Resp: 20 (!) 22  20  Temp: 99.1 F (37.3 C) 98.7 F (37.1 C)  98.6 F (37 C)  TempSrc: Oral Oral  Oral  SpO2: 96% 97%  96%  Weight:   47 kg    No intake or output data in the 24 hours ending 05/17/21 1138  Filed Weights   05/15/21 2139 05/16/21 0347 05/17/21 0500  Weight: 46 kg 47.7 kg 47 kg    Exam:   General: Appearance:    Thin female in no acute distress     Lungs:     respirations unlabored  Heart:    Normal heart rate.   MS:   All extremities are intact.    Neurologic:   Awake, alert         Data Reviewed:   I have personally reviewed following labs and imaging studies:  Labs: Labs show the following:   Basic Metabolic Panel: Recent Labs  Lab 05/11/21 0456 05/11/21 1656 05/12/21 0611 05/12/21 1619 05/13/21 0600 05/14/21 0409  NA 131*  --  133*  --  128* 134*  K 3.3*  --  3.5  --  4.1 4.7  CL 96*  --  99  --  95* 99  CO2 24  --  25  --  23 27  GLUCOSE 78  --  116*  --  118* 111*  BUN 17  --  27*  --  52* 31*  CREATININE 2.73*  --  2.23*  --  3.08* 1.93*  CALCIUM 8.7*  --  8.6*  --  8.7* 8.9  MG 1.8 1.8 1.9 1.9 1.8 1.9  PHOS 4.3 3.3 3.5 3.4 3.4 2.5   GFR Estimated Creatinine Clearance: 19.5 mL/min (A) (by C-G formula based on SCr of 1.93 mg/dL (H)). Liver Function Tests: No results for input(s): AST, ALT, ALKPHOS, BILITOT, PROT, ALBUMIN in the last 168 hours.  No results for input(s): LIPASE, AMYLASE in the last 168 hours. No results for input(s): AMMONIA in the last 168 hours.  Coagulation profile Recent Labs  Lab  05/11/21 0456 05/14/21 0409  INR 1.5* 1.5*    CBC: Recent Labs  Lab 05/11/21 0456 05/13/21 0600 05/13/21 1034 05/14/21 0409  WBC 15.6* 12.2* 14.5* 7.9  HGB 10.4* 10.4* 11.3* 9.9*  HCT 31.4* 31.3* 35.4* 30.5*  MCV 91.8 91.8 93.2 93.0  PLT 213 241 256 227   Cardiac Enzymes: No results for input(s): CKTOTAL, CKMB, CKMBINDEX, TROPONINI in the last 168 hours. BNP (last 3 results) No results for input(s): PROBNP in the last 8760 hours. CBG: Recent Labs  Lab 05/16/21 1512 05/16/21 1944 05/16/21 2331 05/17/21 0341 05/17/21 0744  GLUCAP 97 104* 98 105* 103*   D-Dimer: No results for input(s): DDIMER in the last 72 hours. Hgb A1c: No results for input(s): HGBA1C in the last 72 hours. Lipid Profile: No results for input(s): CHOL, HDL, LDLCALC, TRIG, CHOLHDL, LDLDIRECT in the last 72 hours. Thyroid function studies: No results for input(s): TSH, T4TOTAL, T3FREE, THYROIDAB in the last 72 hours.  Invalid input(s): FREET3 Anemia work up: No results for input(s): VITAMINB12, FOLATE, FERRITIN, TIBC, IRON, RETICCTPCT in the last 72 hours. Sepsis Labs: Recent Labs  Lab 05/11/21 0456 05/13/21 0600 05/13/21 1034 05/14/21 0409  WBC 15.6* 12.2* 14.5* 7.9    Microbiology Recent Results (from the past 240 hour(s))  Resp Panel by RT-PCR (Flu A&B, Covid) Nasopharyngeal Swab     Status: None   Collection Time: 05/08/21  9:34 AM   Specimen: Nasopharyngeal Swab; Nasopharyngeal(NP) swabs in vial transport medium  Result Value Ref Range Status   SARS Coronavirus 2 by RT PCR NEGATIVE NEGATIVE Final    Comment: (NOTE) SARS-CoV-2 target nucleic acids are NOT DETECTED.  The SARS-CoV-2 RNA is generally detectable in upper respiratory specimens during the acute phase of infection. The lowest concentration of SARS-CoV-2 viral copies this assay can detect is 138 copies/mL. A negative result does not preclude SARS-Cov-2 infection and should not be used as the sole basis for treatment  or other patient management decisions. A negative result may occur with  improper specimen collection/handling, submission of specimen other than nasopharyngeal swab, presence of viral mutation(s) within the areas targeted by this assay, and inadequate number of viral copies(<138 copies/mL). A negative result must be combined with clinical observations, patient history, and epidemiological information. The expected result is Negative.  Fact Sheet for Patients:  EntrepreneurPulse.com.au  Fact Sheet for Healthcare Providers:  IncredibleEmployment.be  This test is no t yet approved or cleared by the [place] FDA and  has been authorized for detection and/or diagnosis of SARS-CoV-2 by  FDA under an Emergency Use Authorization (EUA). This EUA will remain  in effect (meaning this test can be used) for the duration of the COVID-19 declaration under Section 564(b)(1) of the Act, 21 U.S.C.section 360bbb-3(b)(1), unless the authorization is terminated  or revoked sooner.       Influenza A by PCR NEGATIVE NEGATIVE Final   Influenza B by PCR NEGATIVE NEGATIVE Final    Comment: (NOTE) The Xpert Xpress SARS-CoV-2/FLU/RSV plus assay is intended as an aid in the diagnosis of influenza from Nasopharyngeal swab specimens and should not be used as a sole basis for treatment. Nasal washings and aspirates are unacceptable for Xpert Xpress SARS-CoV-2/FLU/RSV testing.  Fact Sheet for Patients: EntrepreneurPulse.com.au  Fact Sheet for Healthcare Providers: IncredibleEmployment.be  This test is not yet approved or cleared by the [place] FDA and has been authorized for detection and/or diagnosis of SARS-CoV-2 by FDA under an Emergency Use Authorization (EUA). This EUA will remain in effect (meaning this test can be used) for the duration of the COVID-19 declaration under Section 564(b)(1) of the Act, 21 U.S.C. section  360bbb-3(b)(1), unless the authorization is terminated or revoked.  Performed at [facility], [address] [place], [place] [address]   MRSA Next Gen by PCR, Nasal     Status: None   Collection Time: 05/09/21  3:11 PM   Specimen: Nasal Mucosa; Nasal Swab  Result Value Ref Range Status   MRSA by PCR Next Gen NOT DETECTED NOT DETECTED Final    Comment: (NOTE) The GeneXpert MRSA Assay (FDA approved for NASAL specimens only), is one component of a comprehensive MRSA colonization surveillance program. It is not intended to diagnose MRSA infection nor to guide or monitor treatment for MRSA infections. Test performance is not FDA approved in patients less than 37 years old. Performed at [facility], [place] [address], [place], [place] [address]   Urine Culture     Status: Abnormal   Collection Time: 05/09/21  3:11 PM   Specimen: Urine, Clean Catch  Result Value Ref Range Status   Specimen Description URINE, CLEAN CATCH  Final   Special Requests   Final    NONE Performed at [facility], [place] [address] [place], [place] [address]    Culture (A)  Final    >=100,000 COLONIES/mL ENTEROCOCCUS FAECALIS VANCOMYCIN RESISTANT ENTEROCOCCUS ISOLATED    Report Status 05/12/2021 FINAL  Final   Organism ID, Bacteria ENTEROCOCCUS FAECALIS (A)  Final      Susceptibility   Enterococcus faecalis - MIC*    AMPICILLIN <=2 SENSITIVE Sensitive     NITROFURANTOIN <=16 SENSITIVE Sensitive     VANCOMYCIN >=32 RESISTANT Resistant     GENTAMICIN SYNERGY RESISTANT Resistant     LINEZOLID 2 SENSITIVE Sensitive     * >=100,000 COLONIES/mL ENTEROCOCCUS FAECALIS    Procedures and diagnostic studies:  No results found.  Medications:    chlorhexidine  15 mL Mouth Rinse BID   mouth rinse  15 mL Mouth Rinse q12n4p   Continuous Infusions:   LOS: 9 days   Geradine Girt  [facility]   How to contact the [facility] Attending or Consulting provider [place] or covering provider  during after hours [place], for this patient?  Check the care team in [facility] and look for a) attending/consulting [facility] provider listed and b) the [facility] team listed Log into [email or web address] and use [place]'s universal password to access. If you do not have the password, please  contact the hospital operator. Locate the [facility] provider you are looking for under [facility] and page to a number that you can be directly reached. If you still have difficulty reaching the provider, please page the [facility] (Director on Call) for the Hospitalists listed on amion for assistance.  05/17/2021, 11:38 AM

## 2021-05-18 DIAGNOSIS — Z515 Encounter for palliative care: Secondary | ICD-10-CM

## 2021-05-18 MED ORDER — LORAZEPAM 2 MG/ML IJ SOLN: 0.5000 mg | INTRAMUSCULAR | 0 refills | Status: AC | PRN

## 2021-05-18 MED ORDER — HYDROMORPHONE HCL 1 MG/ML IJ SOLN: 0.5000 mg | INTRAMUSCULAR | 0 refills | Status: AC | PRN

## 2021-05-18 MED ORDER — GLYCOPYRROLATE 0.2 MG/ML IJ SOLN: 0.2000 mg | INTRAMUSCULAR | Status: AC | PRN

## 2021-05-18 MED ORDER — HALOPERIDOL LACTATE 2 MG/ML PO CONC: 0.5000 mg | ORAL | 0 refills | Status: AC | PRN

## 2021-05-18 NOTE — Progress Notes (Addendum)
[facility] Hospital   Consent forms to be completed at [place] at 10:30a.  PTAR notified of patient D/C and transport arranged for 12:30p to [place] ([place]). TOC Festus Holts, RN and Attending Physician/Dr. Eliseo Squires also notified of transport arrangement.    Please send signed DNR form with patient and RN call report to [phone].    Daphene Calamity, MSW [facility] Liaison [phone]

## 2021-05-18 NOTE — Progress Notes (Signed)
[facility]) Hospital Liaison Note  Received request from Transitions of Care Manager for family interest in [place].  Approval for Hospice Home is determined by [facility] MD and patient has been approved. Husband/Mike aware  Please do not hesitate to call with any hospice related questions.    Thank you for the opportunity to participate in this patient's care.  Daphene Calamity, MSW [facility] Liaison  [phone]

## 2021-05-18 NOTE — Discharge Summary (Signed)
Physician Discharge Summary  Amy [name] IHK:[id number] DOB: May 05, 1950 DOA: 05/08/2021  PCP: Amy Iba, MD  Admit date: 05/08/2021 Discharge date: 05/18/2021  Admitted From: SNF Discharge disposition: residential hospice   Recommendations for Outpatient Follow-Up:   Hospice care   Discharge Diagnosis:   Principal Problem:   Seizure [facility]) Active Problems:   Acute metabolic encephalopathy   ESRD (end stage renal disease) on dialysis [facility])   Secondary hypertension   Hospice care patient    Discharge Condition: Iterminal  Diet recommendation: DYS comfort feeds    Code status: DNR.   History of Present Illness:   71 year old woman with hx of subdural hematoma and prior seizures who presents from SNF with decreased responsiveness.  Evaluated by neurology at [facility] who evaluated a spot EEG that raised concern for ongoing seizures so transferred to [facility] for formal management.  History per chart review as patient is encephalopathic.   Other workup in ER revealed slightly high INR, new large left pleural effusion.   Hospital Course by Problem:   Status epilepticus Chronic subdural hematoma ~9 mo ago --transitioning to comfort care   Hx of CAD w/ prior DES Hx of ischemic cardiomyopathy Left arm swelling Acute on chronic pleural effusion:   ESRD on HD dialysis MWF Hyponatremia: hx of hyponatremia -mild Supratherapeutic INR:  Acute cystitis:   Severe malnutrition, present on admission Thyroid nodules Goals of care - DNR, palliative care following- now comfort care     Nutrition Status: Nutrition Problem: Severe Malnutrition Etiology: chronic illness Signs/Symptoms: severe fat depletion, severe muscle depletion Interventions: Tube feeding, Prostat, MVI   Pressure Injury 05/11/21 Sacrum Medial Stage 2 -  Partial thickness loss of dermis presenting as a shallow open injury with a red, pink wound bed without slough. (Active)  05/11/21 1500  Location:  Sacrum  Location Orientation: Medial  Staging: Stage 2 -  Partial thickness loss of dermis presenting as a shallow open injury with a red, pink wound bed without slough.  Wound Description (Comments):   Present on Admission: Yes         Medical Consultants:   Renal Palliative care   Discharge Exam:   Vitals:   05/17/21 1945 05/17/21 2319  BP:  (!) 154/76  Pulse:  95  Resp: 20 20  Temp:  98.7 F (37.1 C)  SpO2:  94%   Vitals:   05/17/21 0500 05/17/21 0748 05/17/21 1945 05/17/21 2319  BP:  (!) 173/82  (!) 154/76  Pulse:  95  95  Resp:  20 20 20   Temp:  98.6 F (37 C)  98.7 F (37.1 C)  TempSrc:  Oral  Axillary  SpO2:  96%  94%  Weight: 47 kg       General exam: Appears calm and comfortable.      The results of significant diagnostics from this hospitalization (including imaging, microbiology, ancillary and laboratory) are listed below for reference.     Procedures and Diagnostic Studies:   CT CHEST W CONTRAST  Result Date: 05/08/2021 CLINICAL DATA:  Altered mental status.  Pleural effusion. EXAM: CT CHEST WITH CONTRAST TECHNIQUE: Multidetector CT imaging of the chest was performed during intravenous contrast administration. CONTRAST:  60mL OMNIPAQUE IOHEXOL 350 MG/ML SOLN COMPARISON:  Chest x-ray dated December 22, 2020. CTA chest dated June 23, 2020. FINDINGS: Cardiovascular: Unchanged borderline cardiomegaly. No pericardial effusion. No thoracic aortic aneurysm or dissection. No central pulmonary embolism. Coronary, aortic arch, and branch vessel atherosclerotic vascular disease. New  tunneled right internal jugular dialysis catheter with tip at the cavoatrial junction. Mediastinum/Nodes: No enlarged mediastinal, hilar, or axillary lymph nodes. Unchanged 1.5 cm hypodense thyroid nodules in the right thyroid lobe and isthmus. Trachea and esophagus demonstrate no significant findings. Lungs/Pleura: Large left and moderate right pleural effusions. Near complete  collapse of the left lower lobe. Dependent subsegmental atelectasis in the posterior right lower lobe. Upper lobe predominant smooth interlobular septal thickening. No consolidation or pneumothorax. Central airways are patent. Upper Abdomen: No acute abnormality. Unchanged nodular liver contour with multiple small hepatic cysts. Unchanged bilateral adrenal gland thickening and left upper pole renal calculi. Unchanged small hiatal hernia. Musculoskeletal: Diffuse anasarca. No acute or significant osseous findings. IMPRESSION: 1. Large left and moderate right pleural effusions with near complete collapse of the left lower lobe. 2. Mild pulmonary interstitial edema. 3. Multiple thyroid nodules which have slowly increased in size since 2018. Recommend thyroid US (ref: J Am Coll Radiol. 2015 Feb;12(2): 143-50). 4. Aortic Atherosclerosis (ICD10-I70.0). Electronically Signed   By: Titus Dubin M.D.   On: 05/08/2021 10:08   DG Chest Port 1 View  Result Date: 05/09/2021 CLINICAL DATA:  71 year old female with shortness of breath. EXAM: PORTABLE CHEST 1 VIEW COMPARISON:  Chest CT 05/08/2021 and earlier. FINDINGS: Portable AP semi upright view at 0525 hours. Continued veiling opacities in the lungs left greater than right. Right chest dual lumen dialysis type catheter is stable. Visualized tracheal air column is within normal limits. Stable cardiac size and mediastinal contours. No pneumothorax or pulmonary edema. No air bronchograms identified. Bilateral nipple shadows. No acute osseous abnormality identified. IMPRESSION: Ongoing left > right pleural effusions. No new cardiopulmonary abnormality. Electronically Signed   By: Genevie Ann M.D.   On: 05/09/2021 08:25   Overnight EEG with video  Result Date: 05/09/2021 Amy Havens, MD     05/10/2021  9:45 AM Patient Name: Amy [name] MRN: [id number] Epilepsy Attending: Lora [name] Referring Physician/Provider: Dr Su [name] Duration: 05/08/2021 1651 to 05/09/2021  1651 Patient history: 71 year old female presenting with unresponsiveness that aborted with Ativan. EEG to evaluate for seizure Level of alertness:  lethargic AEDs during EEG study: LCM Technical aspects: This EEG study was done with scalp electrodes positioned according to the 10-20 International system of electrode placement. Electrical activity was acquired at a sampling rate of 500Hz  and reviewed with a high frequency filter of 70Hz  and a low frequency filter of 1Hz . EEG data were recorded continuously and digitally stored. Description: EEG showed continuous generalized polymorphic disorganized mixed frequencies with predominantly 5 to 9 Hz theta-alpha activity admixed 2-3hz  delta slowing. EEG also showed intermittent 2-3 seconds of generalized eeg attenuation. There was also intermittent 15-18Hz  frontocentral beta activity. Sharp transitn s were noted in left temporal region consistent with breach artifact. Event button was pressed on 05/08/2021 at 1939. During the event patient was laying in bed had left ar  jerking which then progressed to semi rhythmic jerking of bilateral upper extremities. Concomitant EEG before, during and after the event did not show any EEG changes suggest seizure. Hyperventilation and photic stimulation were not performed.   ABNORMALITY - Continuous slow, generalized - Breach artifact, left temporal region IMPRESSION: This study is suggestive of cortical dysfunction arising from left temporal region consistent with underlying craniotomy. There is also severe diffuse encephalopathy, nonspecific etiology. Event button was pressed on 05/08/2021 at 1939. During the event patient was laying in bed had left ar  jerking which then progressed to semi rhythmic jerking of  bilateral upper extremities. Concomitant EEG didn't show any EEG changes suggest seizure. However focal motor seizure amy not be seen on scalp eeg. Therefore clinical correlation is recommended. Amy [name]   CT HEAD CODE  STROKE WO CONTRAST  Result Date: 05/08/2021 CLINICAL DATA:  Code stroke. 71 year old female with altered mental status. Dialysis patient. EXAM: CT HEAD WITHOUT CONTRAST TECHNIQUE: Contiguous axial images were obtained from the base of the skull through the vertex without intravenous contrast. COMPARISON:  Brain MRI 06/20/2020.  Head CT 12/22/2020. FINDINGS: Brain: Chronic dural thickening and small low-density extra-axial collection underlying a left craniotomy is stable to decreased since August (series 3, image 16). No midline shift, mass effect, or evidence of intracranial mass lesion. No acute intracranial hemorrhage identified. No ventriculomegaly. Extensive Patchy and confluent bilateral cerebral white matter hypodensity. And there is a small area of chronic appearing encephalomalacia in the posteroinferior left temporal lobe (series 3, image 12) along the posterior margin of the craniotomy. Elsewhere stable gray-white matter differentiation. No cortically based acute infarct identified. No suspicious intracranial vascular hyperdensity. Vascular: Calcified atherosclerosis at the skull base. Skull: Chronic left side craniotomy is stable. No acute osseous abnormality identified. Sinuses/Orbits: Visualized paranasal sinuses and mastoids are stable and well aerated. Other: No acute orbit or scalp soft tissue finding. ASPECTS [facility] Stroke Program Early CT Score) Total score (0-10 with 10 being normal): 10, chronic left temporal lobe encephalomalacia suspected. IMPRESSION: 1. No acute cortically based infarct or acute intracranial hemorrhage identified. ASPECTS 10. 2. Previous left side craniotomy and subdural evacuation with stable to decreased dural thickening, hygroma. 3. Left inferior temporal lobe encephalomalacia and advanced chronic small vessel disease. 4. These results were communicated to Amy. Leonel Ramsay at 9:41 am on 05/08/2021 by text page via the [facility] messaging system. Electronically Signed   By: Genevie Ann M.D.   On: 05/08/2021 09:42   CT ANGIO HEAD NECK W WO CM (CODE STROKE)  Result Date: 05/08/2021 CLINICAL DATA:  Code stroke. 71 year old female with altered mental status. Dialysis patient. EXAM: CT ANGIOGRAPHY HEAD AND NECK TECHNIQUE: Multidetector CT imaging of the head and neck was performed using the standard protocol during bolus administration of intravenous contrast. Multiplanar CT image reconstructions and MIPs were obtained to evaluate the vascular anatomy. Carotid stenosis measurements (when applicable) are obtained utilizing NASCET criteria, using the distal internal carotid diameter as the denominator. CONTRAST:  74mL OMNIPAQUE IOHEXOL 350 MG/ML SOLN COMPARISON:  Plain head CT 0912 hours today. CTA head and neck 06/19/2020. chest CT today reported separately. FINDINGS: CTA NECK Skeleton: Chronic lower cervical spine degeneration. Sequelae of left craniotomy is new from the prior CTA. No acute osseous abnormality identified. Upper chest: Large layering left pleural effusion with compressive left lung atelectasis, new from 06/19/2020. Small layering right pleural effusion. Mild apical septal thickening bilaterally. Other neck: Right IJ approach dual lumen dialysis type catheter. Mild generalized body wall edema is new from the prior CTA. Chronic thyroid nodules, up to 2 cm appear stable or decreased. Recommend thyroid US (ref: J Am Coll Radiol. 2015 Feb;12(2): 143-50). Otherwise negative neck soft tissues. Aortic arch: 3 vessel arch configuration.  No arch atherosclerosis. Right carotid system: Negative right CCA. Minimal right ICA origin plaque. Tortuous right ICA distal to the bulb but no stenosis. Left carotid system: Minimal plaque at the left ICA origin. Mild tortuosity without stenosis. Vertebral arteries: Negative proximal right subclavian artery and right vertebral artery origin. Mildly tortuous right vertebral is widely patent to the skull base. Negative proximal  left subclavian artery and  left vertebral artery origin. Mildly tortuous left vertebral artery is widely patent to the skull base. CTA HEAD Posterior circulation: Chronic left vertebral artery V4 calcified plaque proximal to the normal PICA origin. Stable mild-to-moderate stenosis there (series 7, image 128). Mild right V4 calcified plaque without significant stenosis. Patent vertebrobasilar junction. Normal right PICA. Patent basilar artery without stenosis. Normal SCA and PCA origins. Posterior communicating arteries are diminutive or absent. Bilateral PCA branches are stable and within normal limits. Anterior circulation: Both ICA siphons are patent. Extensive bilateral siphon calcified plaque. Moderate distal cavernous and supraclinoid segment stenosis appears stable from last year. Similar moderate supraclinoid stenosis appears stable, although distal cavernous stenosis may have increased on series 7, image 101, moderate to severe. Patent carotid termini. Patent MCA and ACA origins. Diminutive or absent anterior communicating artery. Bilateral ACA branches are within normal limits. Right MCA M1 segment and trifurcation are patent without stenosis. Right MCA branches are stable and within normal limits. Mild irregularity and stenosis at the left MCA origin is stable. Left MCA M1 segment and trifurcation otherwise patent without stenosis. Left MCA branches are stable and within normal limits. Venous sinuses: Patent. Anatomic variants: None. Review of the MIP images confirms the above findings IMPRESSION: 1. Negative for large vessel occlusion. 2. Minimal extracranial atherosclerosis. But extensive ICA siphon calcified plaque with moderate left and moderate to severe right siphon stenosis. Mild to moderate left vertebral V4 stenosis due to calcified plaque. 3. Large layering left pleural effusion with compressive atelectasis, new from CTA 06/19/2020. See Chest CT reported separately. 4. Chronic thyroid nodules, which meet consensus criteria  for follow-up Thyroid Ultrasound (ref: J Am Coll Radiol. 2015 Feb;12(2): 143-50). Salient findings were communicated to Amy. Leonel Ramsay at 9:53 am on 05/08/2021 by text page via the [facility] messaging system. Electronically Signed   By: Genevie Ann M.D.   On: 05/08/2021 09:54     Labs:   Basic Metabolic Panel: Recent Labs  Lab 05/11/21 1656 05/12/21 0611 05/12/21 0611 05/12/21 1619 05/13/21 0600 05/14/21 0409  NA  --  133*  --   --  128* 134*  K  --  3.5   < >  --  4.1 4.7  CL  --  99  --   --  95* 99  CO2  --  25  --   --  23 27  GLUCOSE  --  116*  --   --  118* 111*  BUN  --  27*  --   --  52* 31*  CREATININE  --  2.23*  --   --  3.08* 1.93*  CALCIUM  --  8.6*  --   --  8.7* 8.9  MG 1.8 1.9  --  1.9 1.8 1.9  PHOS 3.3 3.5  --  3.4 3.4 2.5   < > = values in this interval not displayed.   GFR Estimated Creatinine Clearance: 19.5 mL/min (A) (by C-G formula based on SCr of 1.93 mg/dL (H)). Liver Function Tests: No results for input(s): AST, ALT, ALKPHOS, BILITOT, PROT, ALBUMIN in the last 168 hours. No results for input(s): LIPASE, AMYLASE in the last 168 hours. No results for input(s): AMMONIA in the last 168 hours. Coagulation profile Recent Labs  Lab 05/14/21 0409  INR 1.5*    CBC: Recent Labs  Lab 05/13/21 0600 05/13/21 1034 05/14/21 0409  WBC 12.2* 14.5* 7.9  HGB 10.4* 11.3* 9.9*  HCT 31.3* 35.4* 30.5*  MCV 91.8 93.2 93.0  PLT 241 256 227   Cardiac Enzymes: No results for input(s): CKTOTAL, CKMB, CKMBINDEX, TROPONINI in the last 168 hours. BNP: Invalid input(s): POCBNP CBG: Recent Labs  Lab 05/16/21 1512 05/16/21 1944 05/16/21 2331 05/17/21 0341 05/17/21 0744  GLUCAP 97 104* 98 105* 103*   D-Dimer No results for input(s): DDIMER in the last 72 hours. Hgb A1c No results for input(s): HGBA1C in the last 72 hours. Lipid Profile No results for input(s): CHOL, HDL, LDLCALC, TRIG, CHOLHDL, LDLDIRECT in the last 72 hours. Thyroid function studies No results  for input(s): TSH, T4TOTAL, T3FREE, THYROIDAB in the last 72 hours.  Invalid input(s): FREET3 Anemia work up No results for input(s): VITAMINB12, FOLATE, FERRITIN, TIBC, IRON, RETICCTPCT in the last 72 hours. Microbiology Recent Results (from the past 240 hour(s))  MRSA Next Gen by PCR, Nasal     Status: None   Collection Time: 05/09/21  3:11 PM   Specimen: Nasal Mucosa; Nasal Swab  Result Value Ref Range Status   MRSA by PCR Next Gen NOT DETECTED NOT DETECTED Final    Comment: (NOTE) The GeneXpert MRSA Assay (FDA approved for NASAL specimens only), is one component of a comprehensive MRSA colonization surveillance program. It is not intended to diagnose MRSA infection nor to guide or monitor treatment for MRSA infections. Test performance is not FDA approved in patients less than 14 years old. Performed at [facility], [place] [address], [place], [place] [address]   Urine Culture     Status: Abnormal   Collection Time: 05/09/21  3:11 PM   Specimen: Urine, Clean Catch  Result Value Ref Range Status   Specimen Description URINE, CLEAN CATCH  Final   Special Requests   Final    NONE Performed at [facility], [place] [address], [place], [place] [address]    Culture (A)  Final    >=100,000 COLONIES/mL ENTEROCOCCUS FAECALIS VANCOMYCIN RESISTANT ENTEROCOCCUS ISOLATED    Report Status 05/12/2021 FINAL  Final   Organism ID, Bacteria ENTEROCOCCUS FAECALIS (A)  Final      Susceptibility   Enterococcus faecalis - MIC*    AMPICILLIN <=2 SENSITIVE Sensitive     NITROFURANTOIN <=16 SENSITIVE Sensitive     VANCOMYCIN >=32 RESISTANT Resistant     GENTAMICIN SYNERGY RESISTANT Resistant     LINEZOLID 2 SENSITIVE Sensitive     * >=100,000 COLONIES/mL ENTEROCOCCUS FAECALIS     Discharge Instructions:   Discharge Instructions     Discharge instructions   Complete by: As directed    DYS 2 comfort feeds   Discharge wound care:   Complete by: As directed    Continue foam  dressings to the coccyx and lower vertebral area. Peel back and assess daily. Change foam dressings every 3 days.   Increase activity slowly   Complete by: As directed       Allergies as of 05/18/2021       Reactions   Cephalosporins Hives, Swelling   Clarithromycin Other (See Comments)   Unknown reaction   Ensure [nutritional Supplements] Nausea And Vomiting   Gabapentin    Lunesta  [eszopiclone]    Heart racing   Nepro [compleat] Nausea And Vomiting   Penicillin V    Has patient had a PCN reaction causing immediate rash, facial/tongue/throat swelling, SOB or lightheadedness with hypotension: Yes Has patient had a PCN reaction causing severe rash involving mucus membranes or skin necrosis: No Has patient had a PCN reaction that required hospitalization No Has patient had  a PCN reaction occurring within the last 10 years: No If all of the above answers are "NO", then may proceed with Cephalosporin use.   Sulfa Antibiotics    Unknown reaction        Medication List     STOP taking these medications    atorvastatin 40 MG tablet Commonly known as: LIPITOR   brivaracetam 25 MG Tabs tablet Commonly known as: BRIVIACT   calcium carbonate 750 MG chewable tablet Commonly known as: TUMS EX   camphor-menthol lotion Commonly known as: SARNA   Chlorhexidine Gluconate Cloth 2 % Pads   Darbepoetin Alfa 60 MCG/0.3ML Sosy injection Commonly known as: ARANESP   diphenhydrAMINE 25 mg capsule Commonly known as: BENADRYL   docusate sodium 100 MG capsule Commonly known as: COLACE   ezetimibe 10 MG tablet Commonly known as: ZETIA   hydrALAZINE 25 MG tablet Commonly known as: APRESOLINE   hydrOXYzine 10 MG tablet Commonly known as: ATARAX   Kaopectate 262 MG/15ML suspension Generic drug: bismuth subsalicylate   lacosamide 50 MG Tabs tablet Commonly known as: VIMPAT   losartan 50 MG tablet Commonly known as: COZAAR   melatonin 5 MG Tabs   methocarbamol 500 MG  tablet Commonly known as: ROBAXIN   metoprolol tartrate 25 MG tablet Commonly known as: LOPRESSOR   multivitamin Tabs tablet   nitroGLYCERIN 0.4 MG SL tablet Commonly known as: NITROSTAT   ondansetron 4 MG tablet Commonly known as: ZOFRAN   pantoprazole 40 MG tablet Commonly known as: PROTONIX   sertraline 50 MG tablet Commonly known as: ZOLOFT   traZODone 100 MG tablet Commonly known as: DESYREL   traZODone 50 MG tablet Commonly known as: DESYREL   triamcinolone cream 0.1 % Commonly known as: KENALOG   valACYclovir 1000 MG tablet Commonly known as: VALTREX   warfarin 1 MG tablet Commonly known as: COUMADIN       TAKE these medications    acetaminophen 325 MG tablet Commonly known as: TYLENOL Take 1-2 tablets (325-650 mg total) by mouth every 4 (four) hours as needed for mild pain. What changed:  when to take this reasons to take this   glycopyrrolate 0.2 MG/ML injection Commonly known as: ROBINUL Inject 1 mL (0.2 mg total) into the skin every 4 (four) hours as needed (excessive secretions).   haloperidol 2 MG/ML solution Commonly known as: HALDOL Place 0.3 mLs (0.6 mg total) under the tongue every 4 (four) hours as needed for agitation (or delirium).   HYDROmorphone 1 MG/ML injection Commonly known as: DILAUDID Inject 0.5-1 mLs (0.5-1 mg total) into the vein every hour as needed (Pain/Dyspnea).   loperamide 2 MG capsule Commonly known as: IMODIUM Take 2 mg by mouth as needed for diarrhea or loose stools. May repeat after each loose stool X 2.   LORazepam 2 MG/ML injection Commonly known as: ATIVAN Inject 0.25-0.5 mLs (0.5-1 mg total) into the vein every hour as needed for anxiety, sedation or seizure.               Discharge Care Instructions  (From admission, onward)           Start     Ordered   05/18/21 0000  Discharge wound care:       Comments: Continue foam dressings to the coccyx and lower vertebral area. Peel back and assess  daily. Change foam dressings every 3 days.   05/18/21 1937              Time coordinating discharge: 35  min  Signed:  Geradine Girt DO  [facility] 05/18/2021, 9:37 AM

## 2021-05-18 NOTE — Progress Notes (Signed)
Contacted [facility] and spoke to [place], Therapist, sports. Mitzi Hansen advised that pt has transitioned to comfort care and was transferred to hospice home today.   Melven Sartorius Renal Navigator [phone]

## 2021-06-03 DEATH — deceased

## 2021-06-22 IMAGING — CR DG CHEST 2V
1 series · 2 of 2 positions shown · non-contrast
Comparison: 12/28/2016

CLINICAL DATA: Chest pain and cough.

EXAM:
CHEST - 2 VIEW

[Series 1: dg chest 2 view · 0.14mm/px · 2 of 2 slices shown]
[im 1/2]
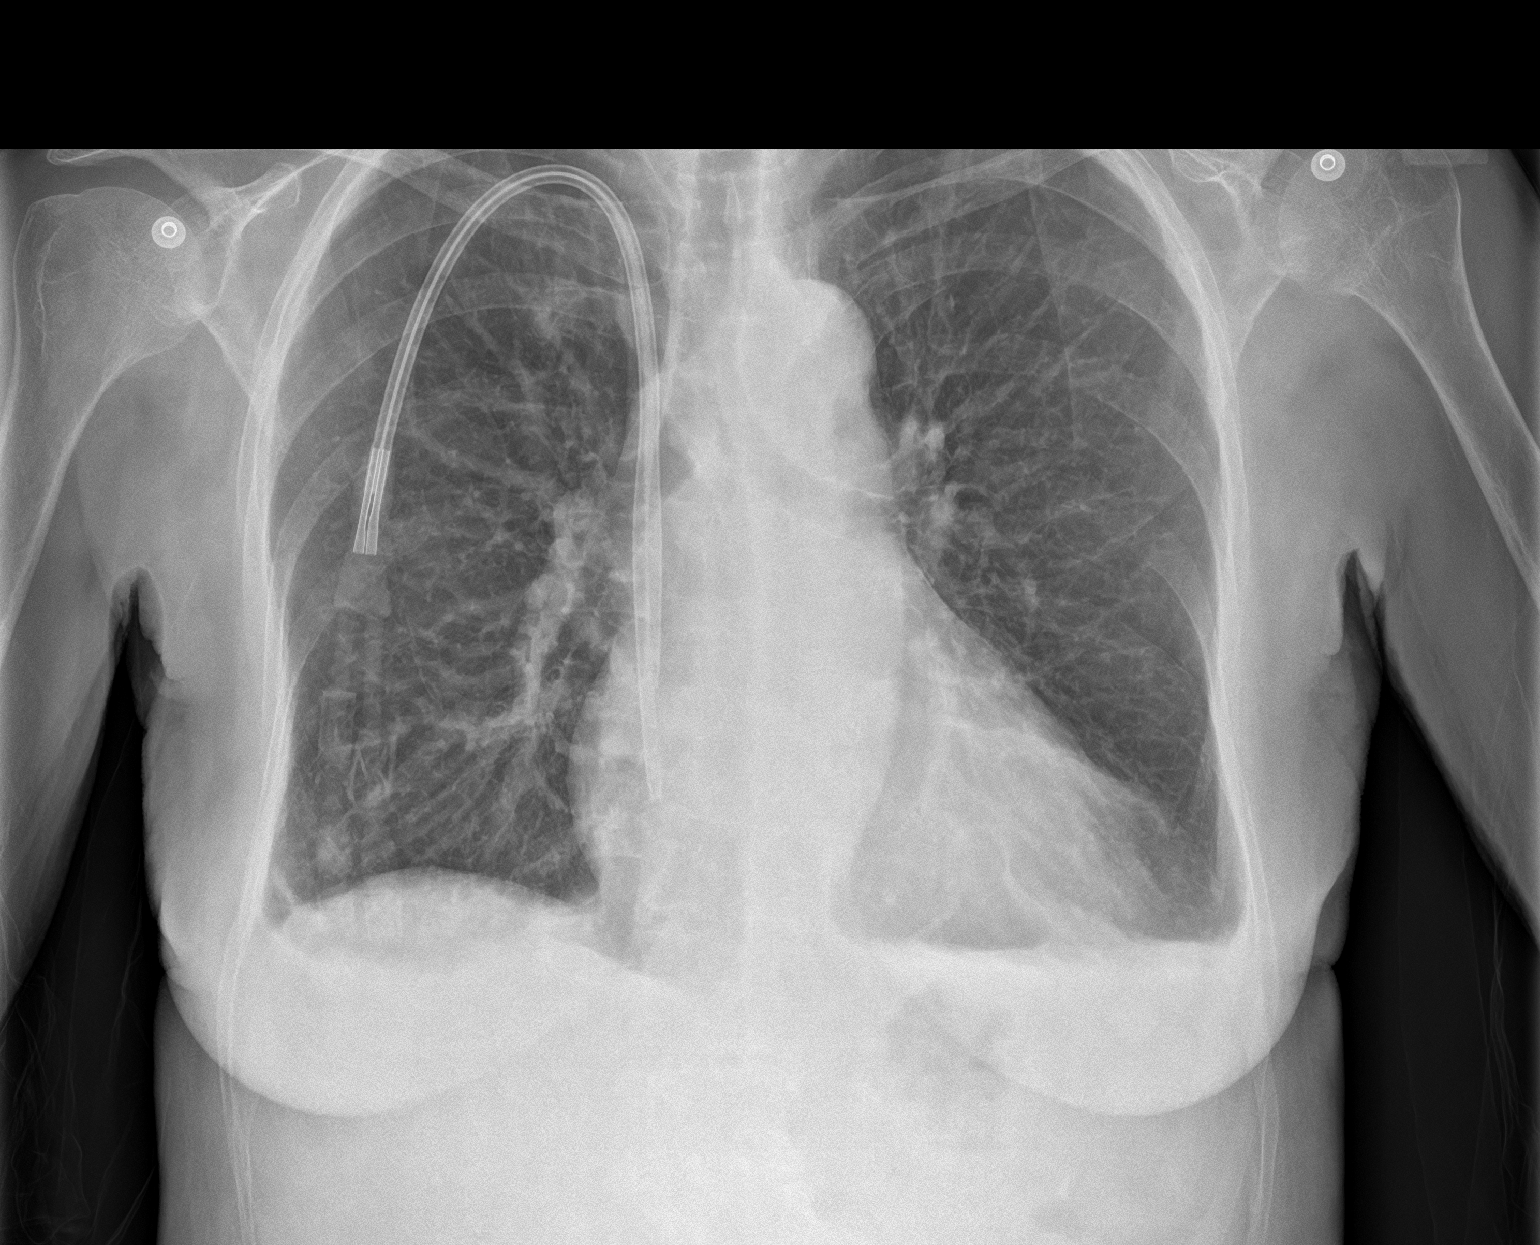
[im 2/2]
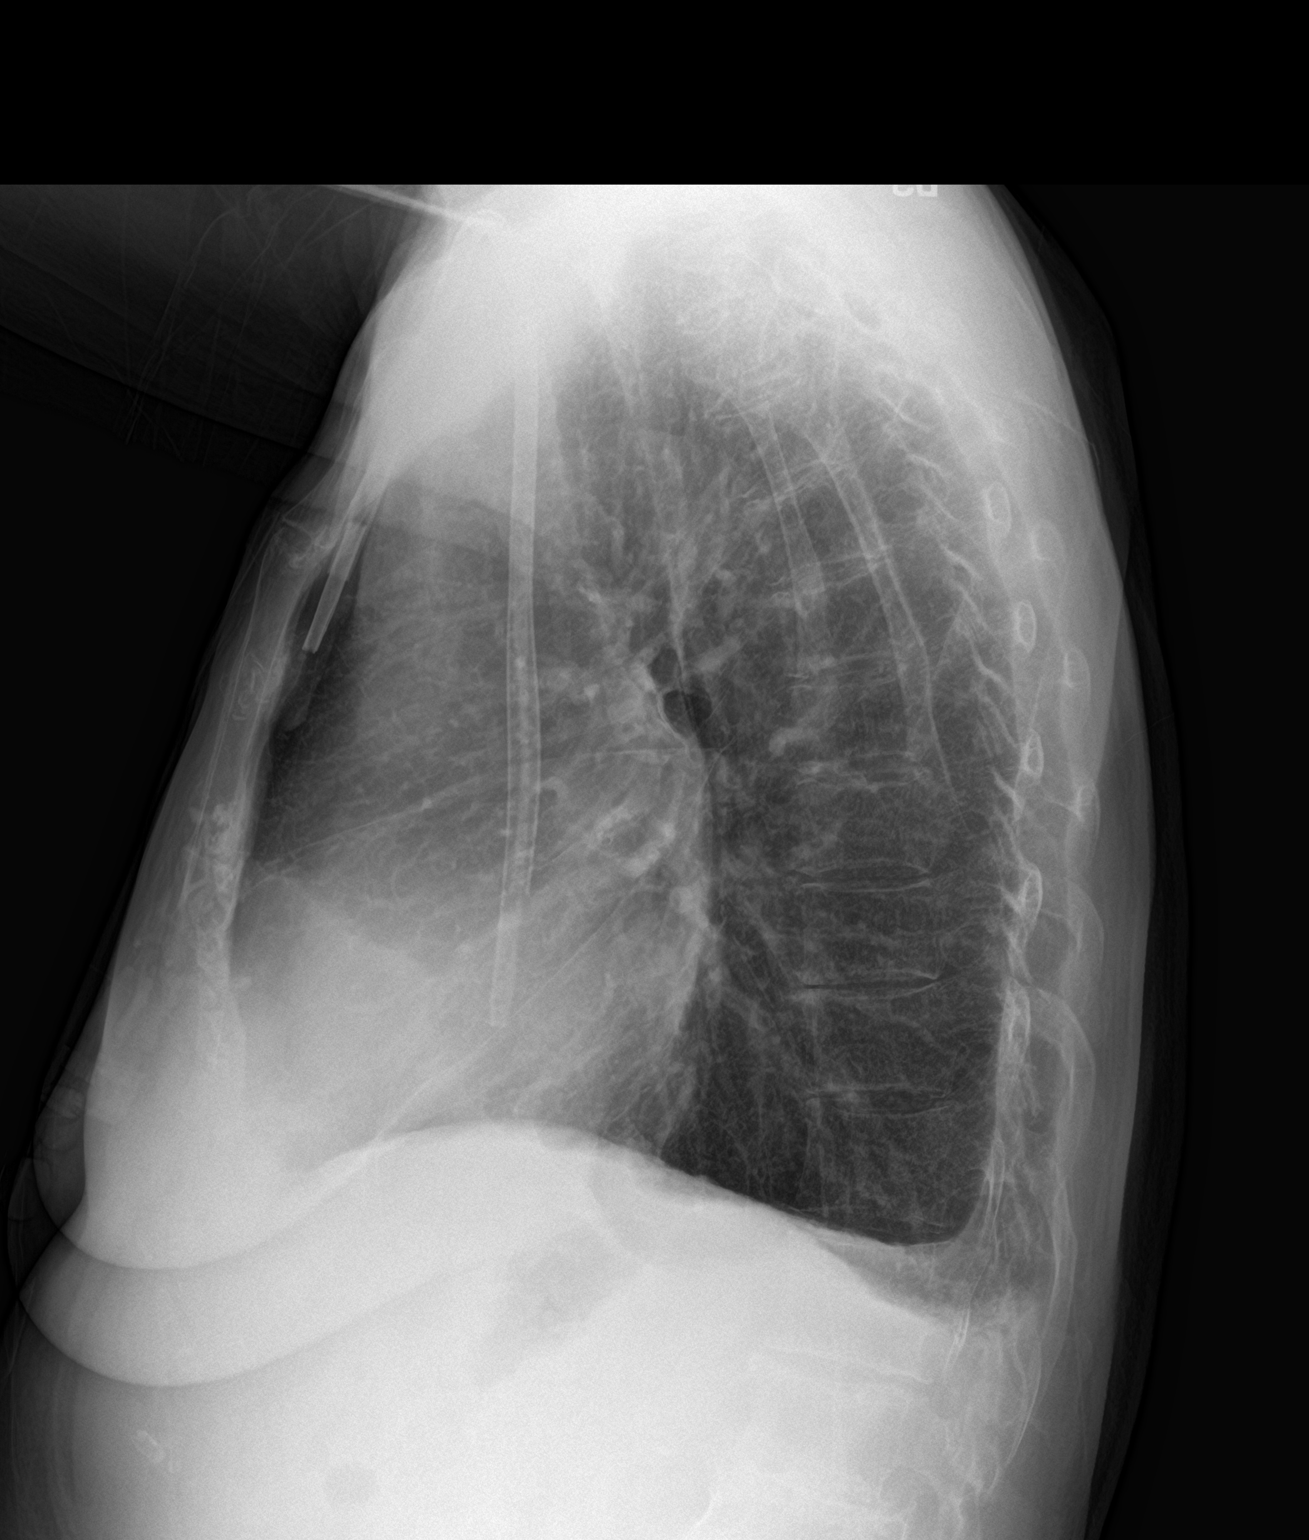

[2 of 2 positions shown; findings below may reference images not displayed]

FINDINGS: There is new mild cardiomegaly as well as new small bilateral
pleural effusions. Pulmonary vascularity is normal. No infiltrates.
Minimal linear atelectasis at the lung bases. Double lumen central
venous dialysis catheter appears in good position with the tips at
the right atrium. Bones are normal.
IMPRESSION: 1. New small bilateral pleural effusions.
2. New mild cardiomegaly.
3. Central venous catheter in good position.

## 2022-08-18 IMAGING — DX DG CHEST 1V PORT
1 series · 1 of 1 positions shown · non-contrast
Comparison: 12/16/2019 and prior.

CLINICAL DATA: Weakness

EXAM:
PORTABLE CHEST 1 VIEW

[chest ap]
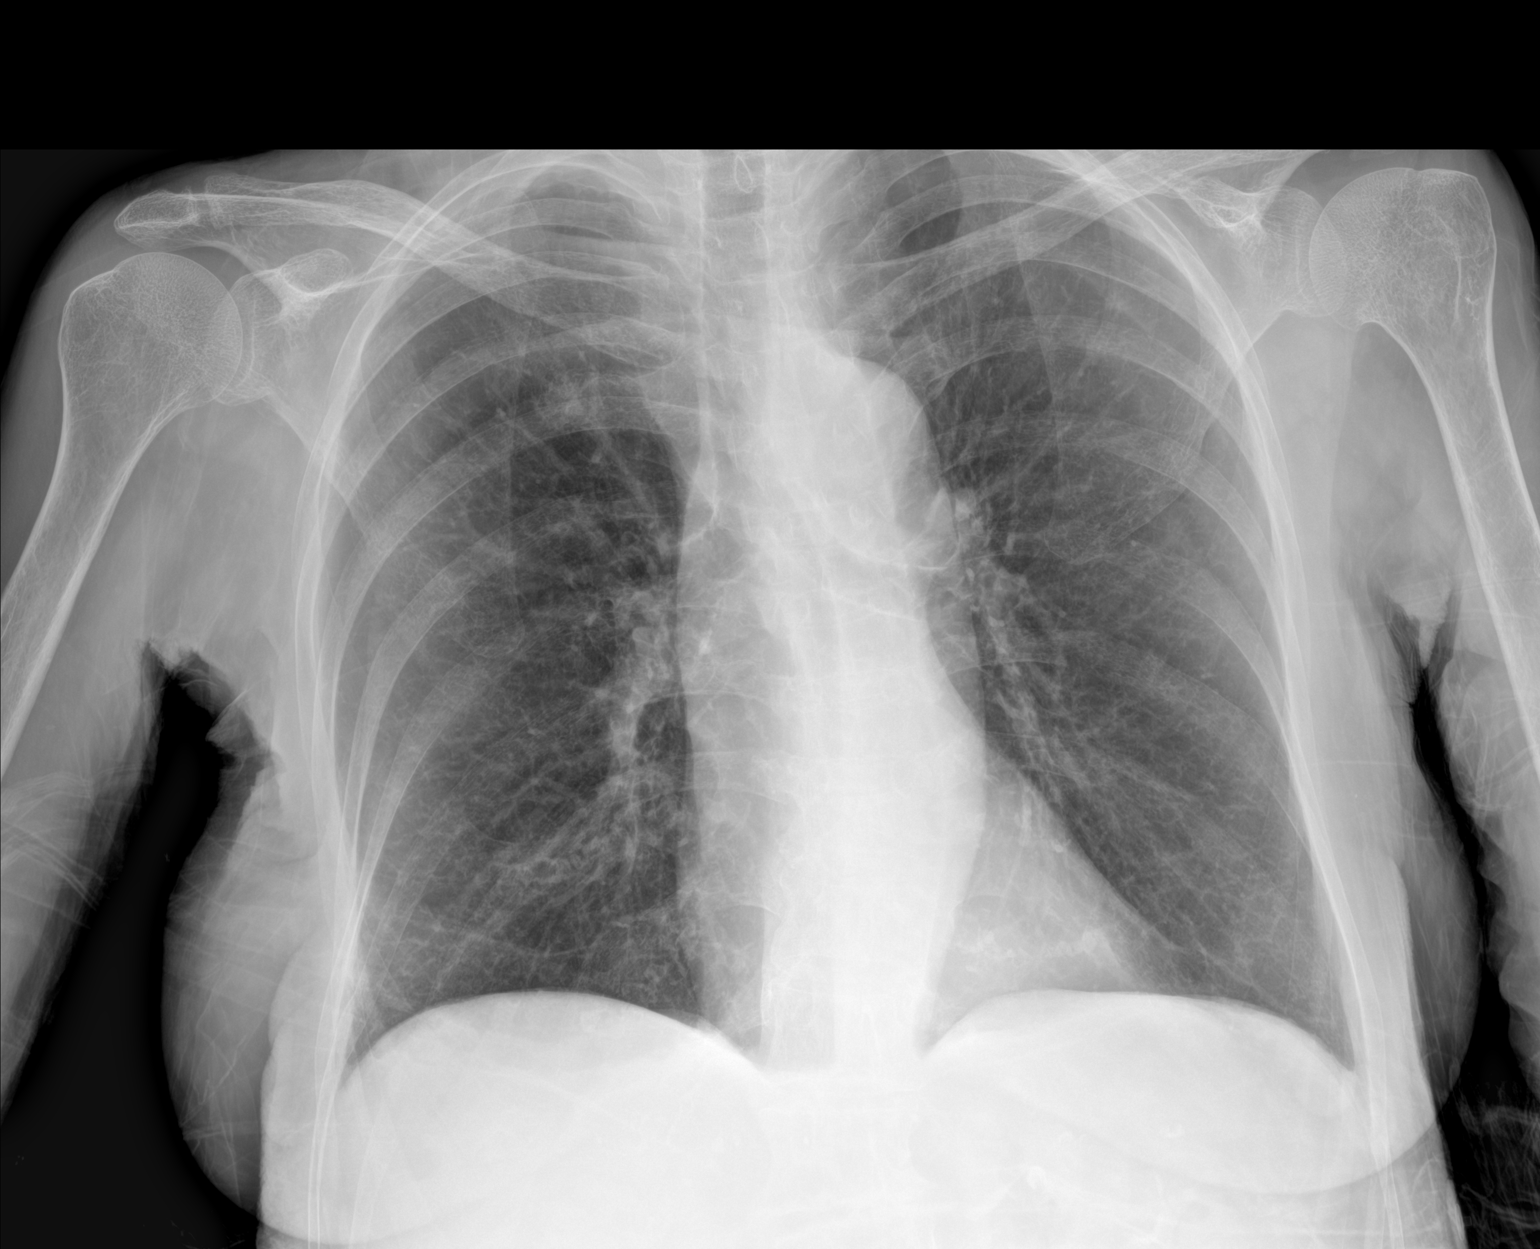

[1 of 1 positions shown; findings below may reference images not displayed]

FINDINGS: No pneumothorax or pleural effusion. Cardiomediastinal silhouette
within normal limits. Hazy appearance of the peripheral lung fields.
No acute osseous abnormality.
IMPRESSION: Hazy appearance of the peripheral lung fields may be artifactual
however cannot exclude atypical infection.

## 2022-09-28 IMAGING — CR DG ABDOMEN 1V
1 series · 1 of 1 positions shown · non-contrast
Comparison: CT 09/26/2016.

CLINICAL DATA: Nausea.  Dialysis.

EXAM:
ABDOMEN - 1 VIEW

[abdomen kub]
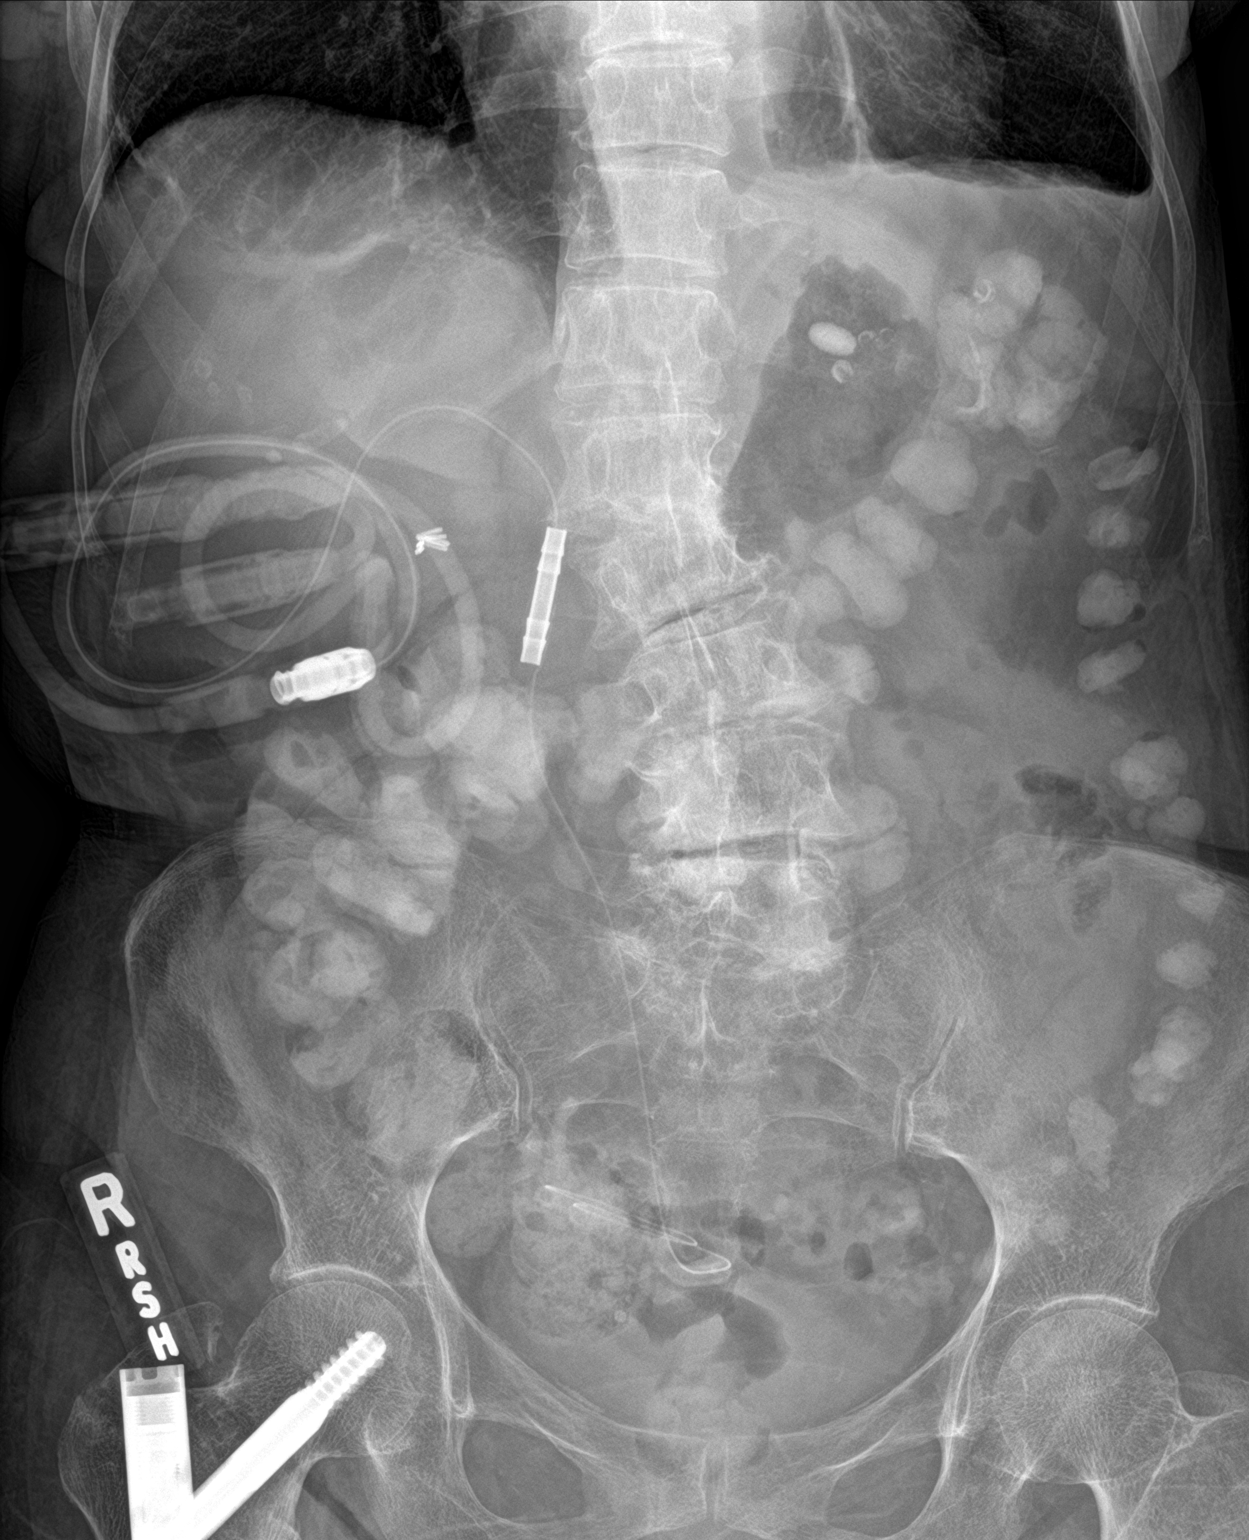

[1 of 1 positions shown; findings below may reference images not displayed]

FINDINGS: Peritoneal dialysis catheter noted. No bowel distention. Oral
contrast noted throughout the colon. No free air. Undigested pill
fragment noted in the stomach. Severe scoliosis and degenerative
changes lumbar spine. Postsurgical changes right hip. Visceral
vascular calcification. Bibasilar atelectasis and or scarring. Tiny
bilateral pleural effusions cannot be excluded.
IMPRESSION: 1. Peritoneal dialysis catheter noted. No bowel distention. Oral
contrast noted throughout the colon. No acute abnormality
identified.

2. Bibasilar atelectasis and or scarring. Tiny bilateral pleural
effusions cannot be excluded.

## 2023-03-13 IMAGING — CT CT ABD-PELV W/O CM
2 of 4 series · 16 of 46 positions shown, 18 images · non-contrast
Comparison: 09/27/2014

CLINICAL DATA: Acute nonlocalized abdominal pain.

EXAM:
CT ABDOMEN AND PELVIS WITHOUT CONTRAST
TECHNIQUE: Multidetector CT imaging of the abdomen and pelvis was performed
following the standard protocol without IV contrast.

[Series 2: routine abd/pel wo · axial · 0.87mm/px · z∈[-156,+250]mm · 13 of 89 slices shown, 15 images]
[im 4/89  soft-tissue]
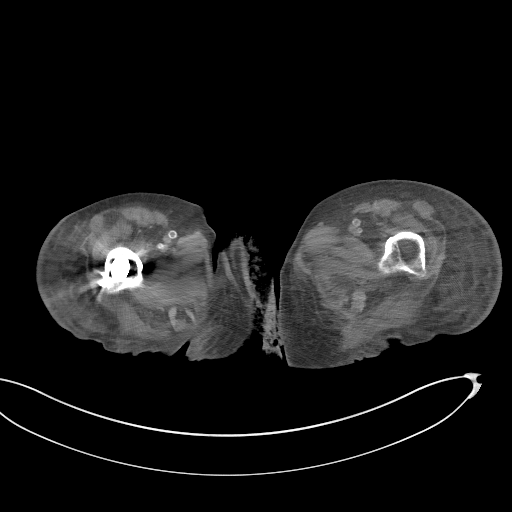
[im 4/89  bone]
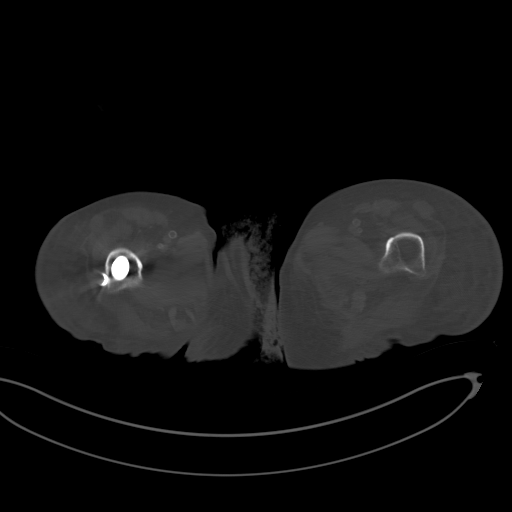
[im 12/89  soft-tissue]
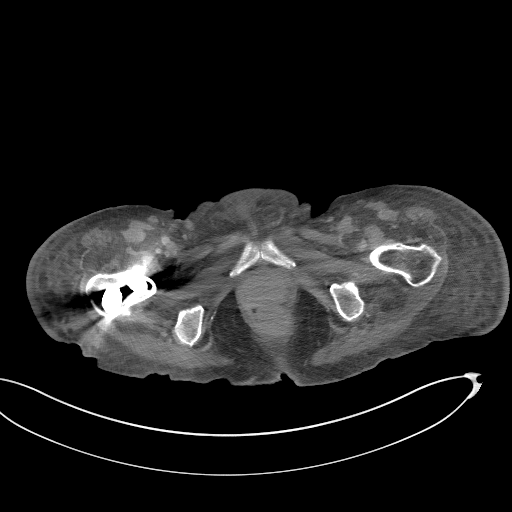
[im 19/89  soft-tissue]
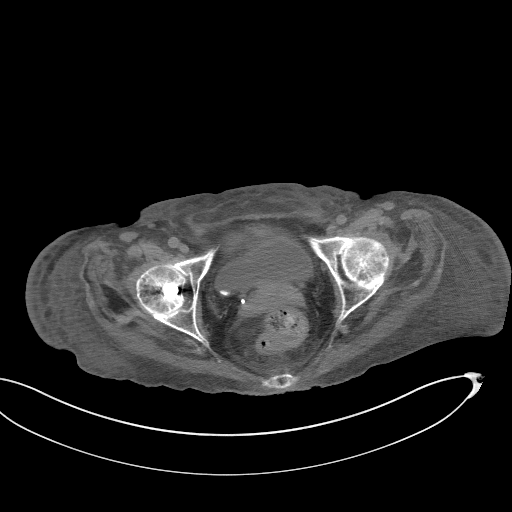
[im 26/89  soft-tissue]
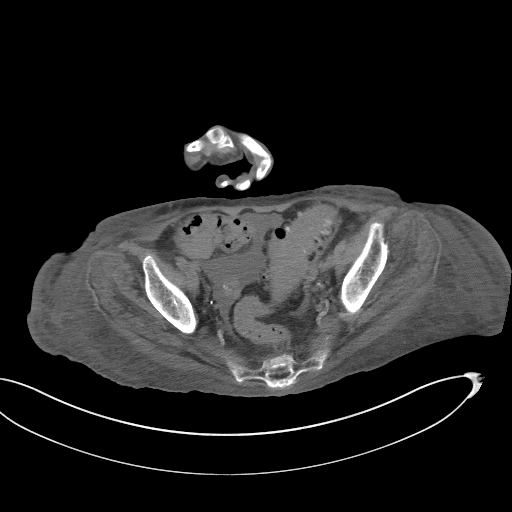
[im 30/89  soft-tissue]
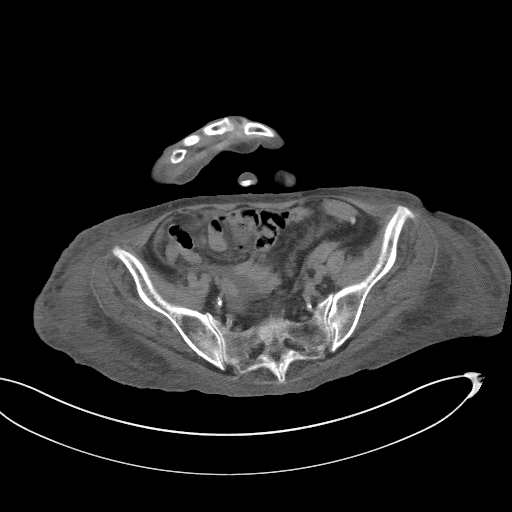
[im 37/89  soft-tissue]
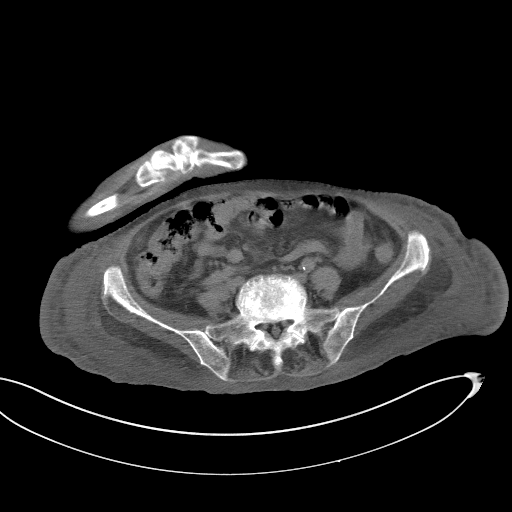
[im 45/89  soft-tissue]
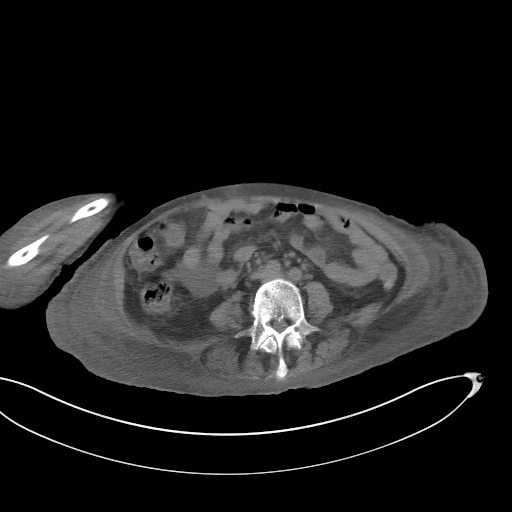
[im 52/89  soft-tissue]
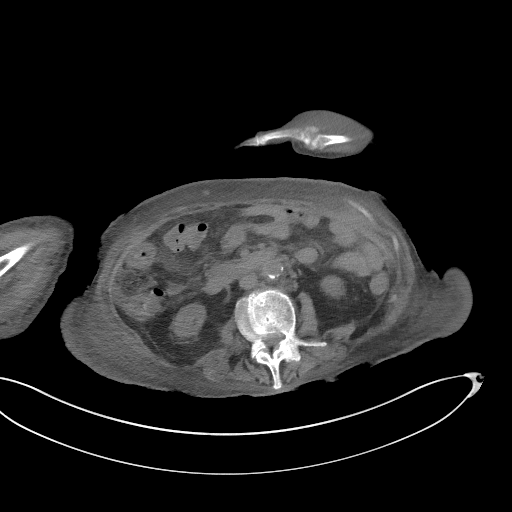
[im 59/89  soft-tissue]
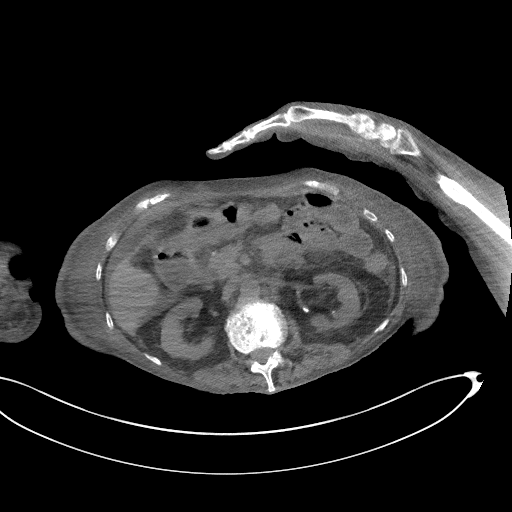
[im 59/89  bone]
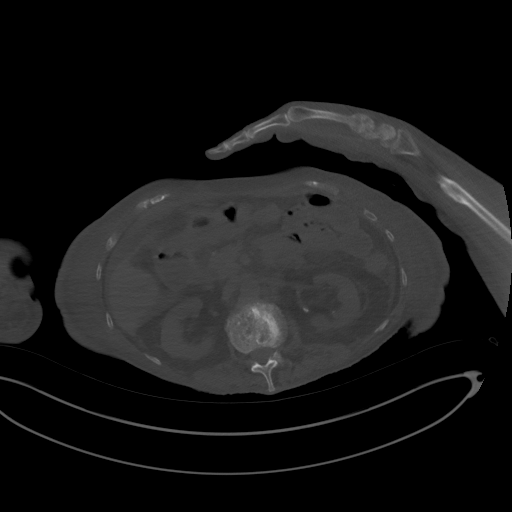
[im 63/89  soft-tissue]
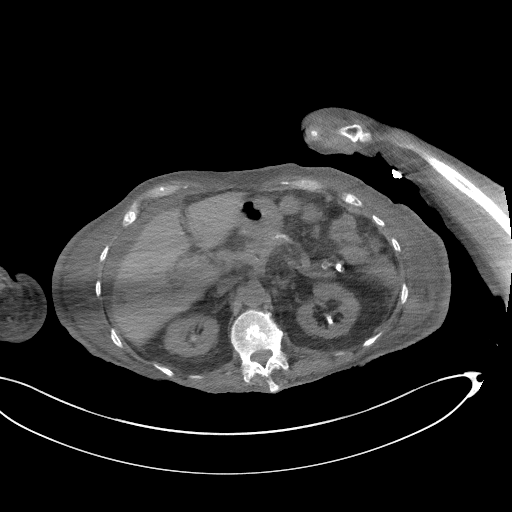
[im 70/89  soft-tissue]
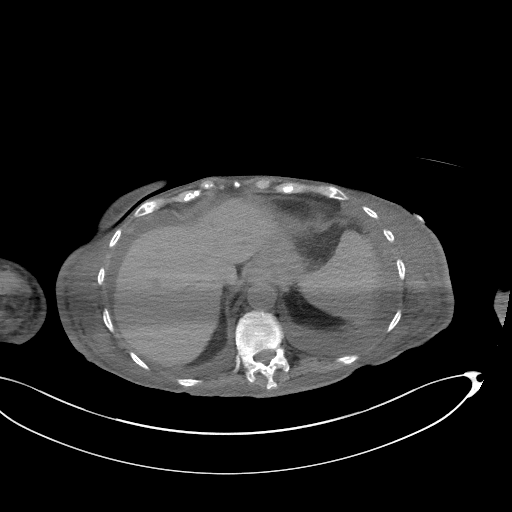
[im 78/89  soft-tissue]
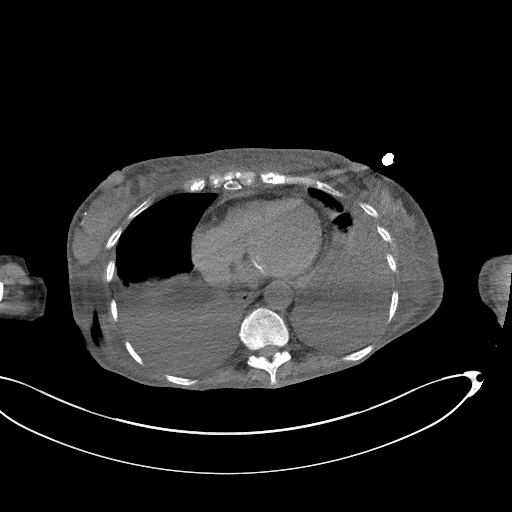
[im 85/89  soft-tissue]
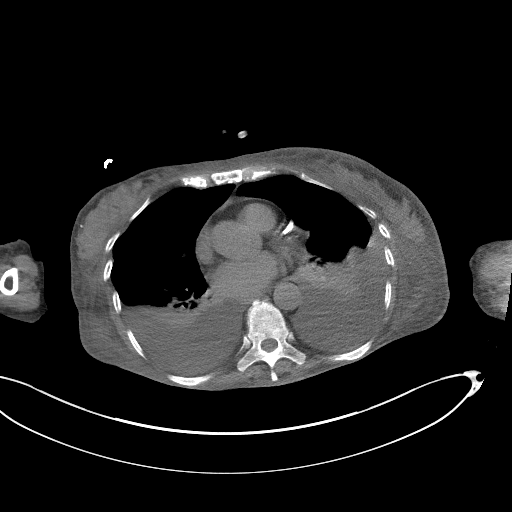

[Series 5: coronal st · coronal · 0.78mm/px · 3 of 76 slices shown]
[im 26/76  soft-tissue]
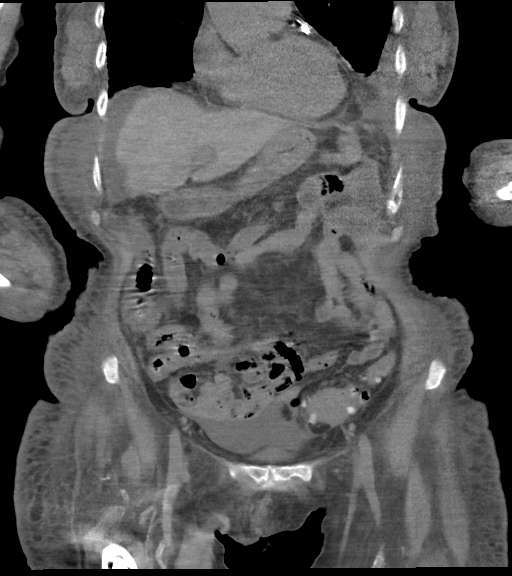
[im 34/76  soft-tissue]
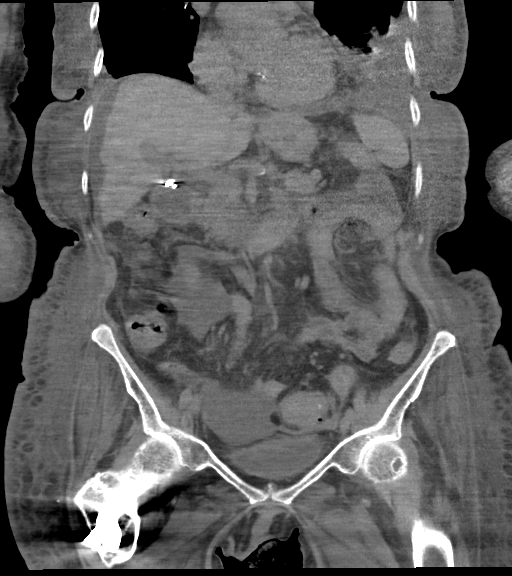
[im 42/76  soft-tissue]
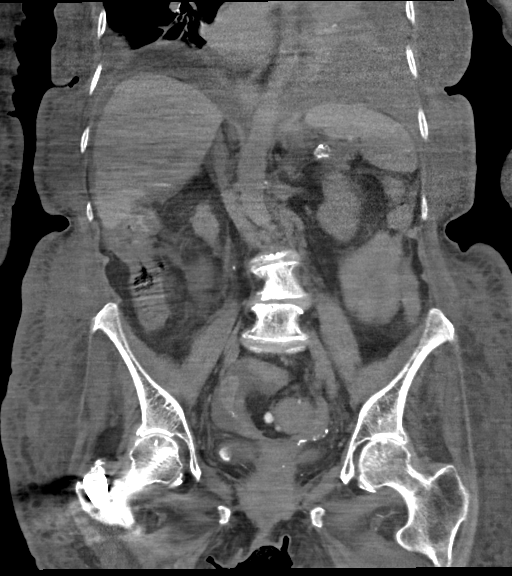

[16 of 46 positions shown; findings below may reference images not displayed]

FINDINGS: Lower chest: Moderate to large bilateral pleural effusions with
basilar atelectasis.

Hepatobiliary: No focal liver abnormality is seen. Status post
cholecystectomy. No biliary dilatation.

Pancreas: Unremarkable. No pancreatic ductal dilatation or
surrounding inflammatory changes.

Spleen: Normal in size without focal abnormality.

Adrenals/Urinary Tract: No adrenal gland nodules. Increased density
in the kidneys could represent intrarenal stones or more likely
residual contrast material from prior contrast administration. No
hydronephrosis or hydroureter. Bladder is decompressed. There
appears to be a stone in the dependent bladder on the right.

Stomach/Bowel: Stomach, small bowel, and colon are mostly
decompressed. Scattered colonic diverticula without evidence of
diverticulitis. Appendix is not identified.

Vascular/Lymphatic: Scattered aortic calcification. No aneurysm.
Scattered retroperitoneal lymph nodes without pathologic
enlargement.

Reproductive: Status post hysterectomy. No adnexal masses.

Other: Moderate free fluid in the abdomen and pelvis, likely
ascites. Diffuse edema throughout the subcutaneous fatty tissues of
the abdomen, pelvis, and visualized lower chest. No free air.

Musculoskeletal: Previous internal fixation of the right hip.
Degenerative changes in the spine. No destructive bone lesions.
IMPRESSION: 1. Moderate bilateral pleural effusions with basilar atelectasis.
2. Moderate abdominal and pelvic ascites.
3. Diffuse soft tissue edema.
4. Increased density in the kidneys may represent multiple
intrarenal stones or residual contrast material. Suggestion of a
stone in the bladder.
5. Colonic diverticula without evidence of diverticulitis.
6. Aortic atherosclerosis.
